# Patient Record
Sex: Female | Born: 1955 | Race: White | Hispanic: No | Marital: Married | Smoking: Never smoker
Health system: Southern US, Community
[De-identification: ages and names within clinical notes are randomized; demographics above are authoritative.]

## PROBLEM LIST (undated history)

## (undated) DIAGNOSIS — Z808 Family history of malignant neoplasm of other organs or systems: Secondary | ICD-10-CM

## (undated) DIAGNOSIS — D219 Benign neoplasm of connective and other soft tissue, unspecified: Secondary | ICD-10-CM

## (undated) DIAGNOSIS — I219 Acute myocardial infarction, unspecified: Secondary | ICD-10-CM

## (undated) DIAGNOSIS — Z800 Family history of malignant neoplasm of digestive organs: Secondary | ICD-10-CM

## (undated) DIAGNOSIS — J069 Acute upper respiratory infection, unspecified: Secondary | ICD-10-CM

## (undated) DIAGNOSIS — E785 Hyperlipidemia, unspecified: Secondary | ICD-10-CM

## (undated) DIAGNOSIS — K449 Diaphragmatic hernia without obstruction or gangrene: Secondary | ICD-10-CM

## (undated) DIAGNOSIS — R51 Headache: Secondary | ICD-10-CM

## (undated) DIAGNOSIS — F329 Major depressive disorder, single episode, unspecified: Secondary | ICD-10-CM

## (undated) DIAGNOSIS — D649 Anemia, unspecified: Secondary | ICD-10-CM

## (undated) DIAGNOSIS — Z9221 Personal history of antineoplastic chemotherapy: Secondary | ICD-10-CM

## (undated) DIAGNOSIS — R011 Cardiac murmur, unspecified: Secondary | ICD-10-CM

## (undated) DIAGNOSIS — G4733 Obstructive sleep apnea (adult) (pediatric): Secondary | ICD-10-CM

## (undated) DIAGNOSIS — A159 Respiratory tuberculosis unspecified: Secondary | ICD-10-CM

## (undated) DIAGNOSIS — E119 Type 2 diabetes mellitus without complications: Secondary | ICD-10-CM

## (undated) DIAGNOSIS — M549 Dorsalgia, unspecified: Secondary | ICD-10-CM

## (undated) DIAGNOSIS — T7840XA Allergy, unspecified, initial encounter: Secondary | ICD-10-CM

## (undated) DIAGNOSIS — J189 Pneumonia, unspecified organism: Secondary | ICD-10-CM

## (undated) DIAGNOSIS — N882 Stricture and stenosis of cervix uteri: Secondary | ICD-10-CM

## (undated) DIAGNOSIS — I209 Angina pectoris, unspecified: Secondary | ICD-10-CM

## (undated) DIAGNOSIS — S0300XA Dislocation of jaw, unspecified side, initial encounter: Secondary | ICD-10-CM

## (undated) DIAGNOSIS — F32A Depression, unspecified: Secondary | ICD-10-CM

## (undated) DIAGNOSIS — K219 Gastro-esophageal reflux disease without esophagitis: Secondary | ICD-10-CM

## (undated) DIAGNOSIS — I509 Heart failure, unspecified: Secondary | ICD-10-CM

## (undated) DIAGNOSIS — F99 Mental disorder, not otherwise specified: Secondary | ICD-10-CM

## (undated) DIAGNOSIS — I2510 Atherosclerotic heart disease of native coronary artery without angina pectoris: Secondary | ICD-10-CM

## (undated) DIAGNOSIS — Z8669 Personal history of other diseases of the nervous system and sense organs: Secondary | ICD-10-CM

## (undated) DIAGNOSIS — K760 Fatty (change of) liver, not elsewhere classified: Secondary | ICD-10-CM

## (undated) DIAGNOSIS — Z923 Personal history of irradiation: Secondary | ICD-10-CM

## (undated) DIAGNOSIS — I499 Cardiac arrhythmia, unspecified: Secondary | ICD-10-CM

## (undated) DIAGNOSIS — C801 Malignant (primary) neoplasm, unspecified: Secondary | ICD-10-CM

## (undated) DIAGNOSIS — F330 Major depressive disorder, recurrent, mild: Secondary | ICD-10-CM

## (undated) DIAGNOSIS — K222 Esophageal obstruction: Secondary | ICD-10-CM

## (undated) DIAGNOSIS — M797 Fibromyalgia: Secondary | ICD-10-CM

## (undated) DIAGNOSIS — Z9889 Other specified postprocedural states: Secondary | ICD-10-CM

## (undated) DIAGNOSIS — K259 Gastric ulcer, unspecified as acute or chronic, without hemorrhage or perforation: Secondary | ICD-10-CM

## (undated) DIAGNOSIS — C50919 Malignant neoplasm of unspecified site of unspecified female breast: Secondary | ICD-10-CM

## (undated) DIAGNOSIS — I10 Essential (primary) hypertension: Secondary | ICD-10-CM

## (undated) DIAGNOSIS — K635 Polyp of colon: Secondary | ICD-10-CM

## (undated) DIAGNOSIS — R0602 Shortness of breath: Secondary | ICD-10-CM

## (undated) DIAGNOSIS — J45909 Unspecified asthma, uncomplicated: Secondary | ICD-10-CM

## (undated) DIAGNOSIS — F419 Anxiety disorder, unspecified: Secondary | ICD-10-CM

## (undated) DIAGNOSIS — M1990 Unspecified osteoarthritis, unspecified site: Secondary | ICD-10-CM

## (undated) DIAGNOSIS — G8929 Other chronic pain: Secondary | ICD-10-CM

## (undated) DIAGNOSIS — R112 Nausea with vomiting, unspecified: Secondary | ICD-10-CM

## (undated) DIAGNOSIS — I4891 Unspecified atrial fibrillation: Secondary | ICD-10-CM

## (undated) HISTORY — DX: Family history of malignant neoplasm of digestive organs: Z80.0

## (undated) HISTORY — DX: Benign neoplasm of connective and other soft tissue, unspecified: D21.9

## (undated) HISTORY — DX: Diaphragmatic hernia without obstruction or gangrene: K44.9

## (undated) HISTORY — DX: Family history of malignant neoplasm of other organs or systems: Z80.8

## (undated) HISTORY — DX: Polyp of colon: K63.5

## (undated) HISTORY — DX: Obstructive sleep apnea (adult) (pediatric): G47.33

## (undated) HISTORY — DX: Unspecified asthma, uncomplicated: J45.909

## (undated) HISTORY — DX: Allergy, unspecified, initial encounter: T78.40XA

## (undated) HISTORY — DX: Heart failure, unspecified: I50.9

## (undated) HISTORY — PX: TUBAL LIGATION: SHX77

## (undated) HISTORY — PX: CARDIAC CATHETERIZATION: SHX172

## (undated) HISTORY — PX: OTHER SURGICAL HISTORY: SHX169

## (undated) HISTORY — PX: LOOP RECORDER IMPLANT: SHX5954

## (undated) HISTORY — DX: Stricture and stenosis of cervix uteri: N88.2

## (undated) HISTORY — DX: Type 2 diabetes mellitus without complications: E11.9

## (undated) HISTORY — DX: Hyperlipidemia, unspecified: E78.5

## (undated) HISTORY — PX: COLONOSCOPY: SHX174

## (undated) HISTORY — PX: ARTHROSCOPIC REPAIR ACL: SUR80

## (undated) HISTORY — PX: CARPAL TUNNEL RELEASE: SHX101

## (undated) HISTORY — DX: Anxiety disorder, unspecified: F41.9

## (undated) HISTORY — DX: Esophageal obstruction: K22.2

## (undated) HISTORY — DX: Major depressive disorder, single episode, unspecified: F32.9

## (undated) HISTORY — DX: Depression, unspecified: F32.A

## (undated) HISTORY — DX: Major depressive disorder, recurrent, mild: F33.0

## (undated) HISTORY — PX: ACHILLES TENDON REPAIR: SUR1153

## (undated) HISTORY — DX: Fatty (change of) liver, not elsewhere classified: K76.0

## (undated) NOTE — *Deleted (*Deleted)
Patient Care Team: Wynn Banker, MD as PCP - General (Family Medicine) Oretha Milch, MD as Consulting Physician (Pulmonary Disease) Yates Decamp, MD as Consulting Physician (Cardiology) Orma Flaming, MD as Referring Physician (Obstetrics and Gynecology) Garen Grams, MD as Referring Physician (Cardiology) Thyra Breed, MD as Consulting Physician (Anesthesiology) Emelia Loron, MD as Consulting Physician (General Surgery) Serena Croissant, MD as Consulting Physician (Hematology and Oncology) Dorothy Puffer, MD as Consulting Physician (Radiation Oncology) Glendale Chard, DO as Consulting Physician (Neurology) Donzetta Starch, MD as Consulting Physician (Dermatology)  DIAGNOSIS: No diagnosis found.  SUMMARY OF ONCOLOGIC HISTORY: Oncology History  Malignant neoplasm of upper-outer quadrant of left breast in female, estrogen receptor negative (HCC)  09/07/2019 Initial Diagnosis   Routine screening mammogram detected a 2.1cm mass in the left breast and no left axillary adenopathy. 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   09/15/2019 Genetic Testing   Genetic testing reported out on September 15, 2019 through the Multi-cancer panel found no pathogenic mutations. The Multi-Gene Panel offered by [facility] includes sequencing and/or deletion duplication testing of the following 85 genes: AIP, ALK, APC, ATM, AXIN2,BAP1,  BARD1, BLM, BMPR1A, BRCA1, BRCA2, BRIP1, CASR, CDC73, CDH1, CDK4, CDKN1B, CDKN1C, CDKN2A (p14ARF), CDKN2A (p16INK4a), CEBPA, CHEK2, CTNNA1, DICER1, DIS3L2, EGFR (c.2369C>T, p.Thr790Met variant only), EPCAM (Deletion/duplication testing only), FH, FLCN, GATA2, GPC3, GREM1 (Promoter region deletion/duplication testing only), HOXB13 (c.251G>A, p.Gly84Glu), HRAS, KIT, MAX, MEN1, MET, MITF (c.952G>A, p.Glu318Lys variant only), MLH1, MSH2, MSH3, MSH6, MUTYH, NBN, NF1, NF2, NTHL1, PALB2, PDGFRA, PHOX2B, PMS2, POLD1, POLE, POT1, PRKAR1A, PTCH1,  PTEN, RAD50, RAD51C, RAD51D, RB1, RECQL4, RET, RNF43, RUNX1, SDHAF2, SDHA (sequence changes only), SDHB, SDHC, SDHD, SMAD4, SMARCA4, SMARCB1, SMARCE1, STK11, SUFU, TERC, TERT, TMEM127, TP53, TSC1, TSC2, VHL, WRN and WT1.  The test report has been scanned into EPIC and is located under the Molecular Pathology section of the Results Review tab.  A portion of the result report is included below for reference.    09/24/2019 - 10/15/2019 Chemotherapy   palonosetron (ALOXI) injection 0.25 mg, 0.25 mg, Intravenous,  Once, 2 of 6 cycles. Administration: 0.25 mg (09/24/2019), 0.25 mg (10/15/2019)  pegfilgrastim (NEULASTA ONPRO KIT) injection 6 mg, 6 mg, Subcutaneous, Once, 2 of 6 cycles. Administration: 6 mg (09/24/2019), 6 mg (10/15/2019)  cyclophosphamide (CYTOXAN) 1,100 mg in sodium chloride 0.9 % 250 mL chemo infusion, 500 mg/m2 = 1,100 mg (83.3 % of original dose 600 mg/m2), Intravenous,  Once, 2 of 6 cycles. Dose modification: 500 mg/m2 (original dose 600 mg/m2, Cycle 1, Reason: Provider Judgment). Administration: 1,100 mg (09/24/2019), 1,100 mg (10/15/2019)  DOCEtaxel (TAXOTERE) 140 mg in sodium chloride 0.9 % 250 mL chemo infusion, 65 mg/m2 = 140 mg (86.7 % of original dose 75 mg/m2), Intravenous,  Once, 2 of 6 cycles. Dose modification: 65 mg/m2 (original dose 75 mg/m2, Cycle 1, Reason: Provider Judgment). Administration: 140 mg (09/24/2019), 140 mg (10/15/2019)  fosaprepitant (EMEND) 150 mg in sodium chloride 0.9 % 145 mL IVPB, Intravenous,  Once, 2 of 6 cycles. Administration:  (09/24/2019),  (10/15/2019)   12/09/2019 Surgery   Left lumpectomy Dwain Sarna) [phone]): microscopic focus of residual IDC, grade 3, with high grade DCIS, clear margins. No regional lymph nodes were examined.   12/10/2019 Cancer Staging   Staging form: Breast, AJCC 8th Edition - Pathologic stage from 12/10/2019: No Stage Recommended (ypT1a, pN0, cM0) - Signed by Loa Socks, NP on 12/23/2019   01/13/2020 -  02/08/2020 Radiation Therapy   The patient  initially received a dose of 42.56 Gy in 16 fractions to the breast using whole-breast tangent fields. This was delivered using a 3-D conformal technique. The pt received a boost delivering an additional 8 Gy in 4 fractions using a electron boost with electrons. The total dose was 50.56 Gy.     CHIEF COMPLIANT: Follow-up of triple negative left breast cancer  INTERVAL HISTORY: Shelby Anderson is a 49 y.o. with above-mentioned history of triple negative left breast cancer treated with neoadjuvant chemotherapy, lumpectomy, radiation, and who is currently on surveillance. She presents to the clinic today for follow-up.  ALLERGIES:  is allergic to lodine [etodolac], oxycontin [oxycodone hcl], penicillins, aspirin, darvocet [propoxyphene n-acetaminophen], nitroglycerin, tramadol, ultram [tramadol hcl], and valium.  MEDICATIONS:  Current Outpatient Medications  Medication Sig Dispense Refill  . albuterol (PROVENTIL) (2.5 MG/3ML) 0.083% nebulizer solution Take 3 mLs (2.5 mg total) by nebulization every 6 (six) hours as needed for wheezing or shortness of breath (J45.40). (Patient taking differently: Take 2.5 mg by nebulization every 6 (six) hours as needed for wheezing or shortness of breath. ) 75 mL 3  . albuterol (VENTOLIN HFA) 108 (90 Base) MCG/ACT inhaler Inhale 2 puffs into the lungs every 6 (six) hours as needed for wheezing or shortness of breath. 3 g 3  . aspirin EC 81 MG tablet Take 81 mg at bedtime by mouth.     Marland Kitchen atorvastatin (LIPITOR) 40 MG tablet TAKE 1 TABLET BY MOUTH EVERYDAY AT BEDTIME. **DUE FOR YEARLY PHYSICAL** 30 tablet 5  . Blood Glucose Monitoring Suppl (ONE TOUCH ULTRA 2) w/Device KIT USE TO CHECK BLOOD SUGAR 2 TIMES A DAY 1 kit 0  . budesonide-formoterol (SYMBICORT) 80-4.5 MCG/ACT inhaler INHALE 2 PUFFS INTO THE LUNGS EVERY MORNING AND ANOTHER 2 PUFFS 12 HOURS LATER 30.6 Inhaler 1  . Calcium Carbonate-Vitamin D 600-200 MG-UNIT  TABS Take 1 tablet by mouth daily.    Marland Kitchen CINNAMON PO Take 1,000 mg 2 (two) times daily by mouth.    . clobetasol cream (TEMOVATE) 0.05 % Apply topically 2 (two) times daily. (Patient taking differently: Apply topically daily as needed (rash). ) 30 g 0  . doxepin (SINEQUAN) 10 MG capsule Take 20 mg by mouth at bedtime.     . famotidine (PEPCID) 20 MG tablet TAKE 1 TABLET BY MOUTH 2 TIMES DAILY AS NEEDED FOR HEARTBURN OR INDIGESTION. 180 tablet 1  . fluticasone (FLONASE) 50 MCG/ACT nasal spray Place 2 sprays at bedtime as needed into both nostrils for allergies or rhinitis.     . furosemide (LASIX) 20 MG tablet TAKE 1 TO 2 TABLETS BY MOUTH EVERY DAY AS DIRECTED 60 tablet 0  . glipiZIDE (GLUCOTROL XL) 5 MG 24 hr tablet TAKE 1 TABLET BY MOUTH EVERYDAY WITH BREAKFAST. **DUE FOR YEARLY PHYSICAL** 30 tablet 0  . L-Methylfolate (DEPLIN) 7.5 MG TABS Take 7.5 mg by mouth daily with breakfast.     . Lancets (ONETOUCH DELICA PLUS LANCET30G) MISC USE TO CHECK BLOOD SUGAR 2 TIMES A DAY 100 each 3  . levothyroxine (SYNTHROID) 50 MCG tablet Take 1 tablet (50 mcg total) by mouth daily. 90 tablet 0  . LYRICA 150 MG capsule Take 150 mg by mouth 3 (three) times daily.   2  . metFORMIN (GLUCOPHAGE) 500 MG tablet TAKE 1 TABLET BY MOUTH TWICE A DAY WITH MEALS 60 tablet 0  . Misc Natural Products (GLUCOS-CHONDROIT-MSM COMPLEX PO) Take 1 tablet 3 (three) times daily by mouth.     . montelukast (SINGULAIR) 10  MG tablet TAKE 1 TABLET BY MOUTH EVERYDAY AT BEDTIME 90 tablet 1  . Multiple Vitamin (MULITIVITAMIN WITH MINERALS) TABS Take 1 tablet by mouth daily.      . mupirocin cream (BACTROBAN) 2 % Apply once daily to lesions on feet as needed. 30 g 1  . NEXIUM 40 MG capsule TAKE 1 CAPSULE BY MOUTH TWICE A DAY 180 capsule 1  . nitroGLYCERIN (NITROSTAT) 0.4 MG SL tablet Place 0.4 mg under the tongue every 5 (five) minutes as needed for chest pain.     Marland Kitchen ondansetron (ZOFRAN) 4 MG tablet Take 4 mg by mouth every 8 (eight) hours as  needed for nausea or vomiting.    Letta Pate ULTRA test strip USE TO CHECK BLOOD SUGAR 2 TIMES A DAY 100 strip 0  . Oxycodone HCl 20 MG TABS Take 20 mg every 4 (four) hours as needed by mouth (pain).     . OXYGEN Inhale 2 L into the lungs at bedtime as needed (shortness of breath).     . potassium chloride (KLOR-CON) 10 MEQ tablet TAKE 2 TABLETS BY MOUTH DAILY 180 tablet 0  . promethazine (PHENERGAN) 25 MG tablet TAKE 1 TABLET (25 MG TOTAL) BY MOUTH EVERY 6 (SIX) HOURS AS NEEDED FOR NAUSEA OR VOMITING. 90 tablet 3  . Spacer/Aero-Holding Chambers DEVI 1 Device by Does not apply route daily as needed. 1 Device 0  . sucralfate (CARAFATE) 1 g tablet DISSOLVE 1 TAB IN 1 TABLESPOON OF DISTILLED WATER FOR 15 MINS. TAKE 4XDAILY WITH MEALS & AT BEDTIME 360 tablet 3  . tiZANidine (ZANAFLEX) 4 MG tablet Take 4 mg by mouth every 8 (eight) hours as needed for muscle spasms.    . vitamin B-12 (CYANOCOBALAMIN) 500 MCG tablet Take 500 mcg by mouth daily.      No current facility-administered medications for this visit.    PHYSICAL EXAMINATION: ECOG PERFORMANCE STATUS: {CHL ONC ECOG PS:[phone]}  There were no vitals filed for this visit. There were no vitals filed for this visit.  BREAST:*** No palpable masses or nodules in either right or left breasts. No palpable axillary supraclavicular or infraclavicular adenopathy no breast tenderness or nipple discharge. (exam performed in the presence of a chaperone)  LABORATORY DATA:  I have reviewed the data as listed CMP Latest Ref Rng & Units 09/30/2020 12/08/2019 11/05/2019  Glucose 65 - 99 mg/dL 952(W) 413(K) 440(N)  BUN 7 - 25 mg/dL 12 6(L) 19  Creatinine 0.50 - 0.99 mg/dL 0.27 2.53 6.64  Sodium 135 - 146 mmol/L 142 143 145  Potassium 3.5 - 5.3 mmol/L 4.2 3.7 3.9  Chloride 98 - 110 mmol/L 101 102 106  CO2 20 - 32 mmol/L 29 27 31   Calcium 8.6 - 10.4 mg/dL 9.8 9.2 9.3  Total Protein 6.1 - 8.1 g/dL 7.5 6.6 6.7  Total Bilirubin 0.2 - 1.2 mg/dL 0.5 0.3  <4.0(H)  Alkaline Phos 38 - 126 U/L - 63 77  AST 10 - 35 U/L 26 28 29   ALT 6 - 29 U/L 25 29 36    Lab Results  Component Value Date   WBC 5.5 09/30/2020   HGB 13.7 09/30/2020   HCT 42.8 09/30/2020   MCV 84.9 09/30/2020   PLT 271 09/30/2020   NEUTROABS 3,564 09/30/2020    ASSESSMENT & PLAN:  No problem-specific Assessment & Plan notes found for this encounter.    No orders of the defined types were placed in this encounter.  The patient has a good understanding of  the overall plan. she agrees with it. she will call with any problems that may develop before the next visit here.  Total time spent: *** mins including face to face time and time spent for planning, charting and coordination of care  Serena Croissant, MD 10/17/2020  I, Kirt Boys Dorshimer, am acting as scribe for Dr. Serena Croissant.  {insert scribe attestation}

---

## 1999-03-09 ENCOUNTER — Ambulatory Visit (HOSPITAL_COMMUNITY): Admission: RE | Admit: 1999-03-09 | Discharge: 1999-03-09 | Payer: Self-pay | Admitting: *Deleted

## 1999-05-30 ENCOUNTER — Emergency Department (HOSPITAL_COMMUNITY): Admission: EM | Admit: 1999-05-30 | Discharge: 1999-05-30 | Payer: Self-pay | Admitting: Emergency Medicine

## 1999-05-30 ENCOUNTER — Encounter: Payer: Self-pay | Admitting: Emergency Medicine

## 1999-08-30 ENCOUNTER — Ambulatory Visit (HOSPITAL_COMMUNITY): Admission: RE | Admit: 1999-08-30 | Discharge: 1999-08-30 | Payer: Self-pay | Admitting: *Deleted

## 1999-09-19 ENCOUNTER — Encounter: Payer: Self-pay | Admitting: Family Medicine

## 1999-09-19 ENCOUNTER — Encounter: Admission: RE | Admit: 1999-09-19 | Discharge: 1999-09-19 | Payer: Self-pay | Admitting: Family Medicine

## 2001-03-03 ENCOUNTER — Ambulatory Visit (HOSPITAL_COMMUNITY): Admission: RE | Admit: 2001-03-03 | Discharge: 2001-03-03 | Payer: Self-pay | Admitting: *Deleted

## 2001-09-19 ENCOUNTER — Ambulatory Visit (HOSPITAL_COMMUNITY): Admission: RE | Admit: 2001-09-19 | Discharge: 2001-09-19 | Payer: Self-pay | Admitting: *Deleted

## 2001-11-20 ENCOUNTER — Emergency Department (HOSPITAL_COMMUNITY): Admission: EM | Admit: 2001-11-20 | Discharge: 2001-11-20 | Payer: Self-pay

## 2002-04-14 ENCOUNTER — Ambulatory Visit (HOSPITAL_BASED_OUTPATIENT_CLINIC_OR_DEPARTMENT_OTHER): Admission: RE | Admit: 2002-04-14 | Discharge: 2002-04-14 | Payer: Self-pay | Admitting: Orthopedic Surgery

## 2002-04-20 ENCOUNTER — Ambulatory Visit (HOSPITAL_COMMUNITY): Admission: RE | Admit: 2002-04-20 | Discharge: 2002-04-20 | Payer: Self-pay | Admitting: Orthopedic Surgery

## 2002-04-21 ENCOUNTER — Encounter: Admission: RE | Admit: 2002-04-21 | Discharge: 2002-06-09 | Payer: Self-pay | Admitting: Orthopedic Surgery

## 2002-06-03 ENCOUNTER — Ambulatory Visit (HOSPITAL_COMMUNITY): Admission: RE | Admit: 2002-06-03 | Discharge: 2002-06-03 | Payer: Self-pay | Admitting: *Deleted

## 2003-03-19 ENCOUNTER — Ambulatory Visit (HOSPITAL_COMMUNITY): Admission: RE | Admit: 2003-03-19 | Discharge: 2003-03-19 | Payer: Self-pay | Admitting: *Deleted

## 2003-03-29 ENCOUNTER — Encounter: Admission: RE | Admit: 2003-03-29 | Discharge: 2003-03-29 | Payer: Self-pay | Admitting: Family Medicine

## 2003-03-29 ENCOUNTER — Encounter: Payer: Self-pay | Admitting: Family Medicine

## 2003-09-30 ENCOUNTER — Other Ambulatory Visit: Admission: RE | Admit: 2003-09-30 | Discharge: 2003-09-30 | Payer: Self-pay | Admitting: Family Medicine

## 2004-04-13 ENCOUNTER — Encounter: Admission: RE | Admit: 2004-04-13 | Discharge: 2004-04-13 | Payer: Self-pay | Admitting: Orthopedic Surgery

## 2004-04-29 ENCOUNTER — Emergency Department (HOSPITAL_COMMUNITY): Admission: EM | Admit: 2004-04-29 | Discharge: 2004-04-30 | Payer: Self-pay | Admitting: Emergency Medicine

## 2004-05-02 ENCOUNTER — Encounter: Admission: RE | Admit: 2004-05-02 | Discharge: 2004-05-02 | Payer: Self-pay | Admitting: Orthopedic Surgery

## 2004-05-19 ENCOUNTER — Encounter: Admission: RE | Admit: 2004-05-19 | Discharge: 2004-05-19 | Payer: Self-pay | Admitting: Orthopedic Surgery

## 2004-05-31 ENCOUNTER — Ambulatory Visit (HOSPITAL_COMMUNITY): Admission: RE | Admit: 2004-05-31 | Discharge: 2004-05-31 | Payer: Self-pay | Admitting: *Deleted

## 2004-06-08 ENCOUNTER — Encounter: Admission: RE | Admit: 2004-06-08 | Discharge: 2004-07-21 | Payer: Self-pay | Admitting: Orthopedic Surgery

## 2005-01-09 ENCOUNTER — Ambulatory Visit (HOSPITAL_COMMUNITY): Admission: RE | Admit: 2005-01-09 | Discharge: 2005-01-09 | Payer: Self-pay | Admitting: Physician Assistant

## 2005-10-05 ENCOUNTER — Inpatient Hospital Stay (HOSPITAL_COMMUNITY): Admission: EM | Admit: 2005-10-05 | Discharge: 2005-10-06 | Payer: Self-pay | Admitting: *Deleted

## 2006-01-24 ENCOUNTER — Ambulatory Visit (HOSPITAL_COMMUNITY): Admission: RE | Admit: 2006-01-24 | Discharge: 2006-01-24 | Payer: Self-pay | Admitting: Physician Assistant

## 2006-03-27 ENCOUNTER — Emergency Department (HOSPITAL_COMMUNITY): Admission: EM | Admit: 2006-03-27 | Discharge: 2006-03-27 | Payer: Self-pay | Admitting: Emergency Medicine

## 2006-09-24 ENCOUNTER — Ambulatory Visit (HOSPITAL_COMMUNITY): Admission: RE | Admit: 2006-09-24 | Discharge: 2006-09-24 | Payer: Self-pay | Admitting: Orthopedic Surgery

## 2007-03-11 ENCOUNTER — Emergency Department (HOSPITAL_COMMUNITY): Admission: EM | Admit: 2007-03-11 | Discharge: 2007-03-11 | Payer: Self-pay | Admitting: Emergency Medicine

## 2007-04-21 ENCOUNTER — Ambulatory Visit (HOSPITAL_COMMUNITY): Admission: RE | Admit: 2007-04-21 | Discharge: 2007-04-21 | Payer: Self-pay | Admitting: *Deleted

## 2008-07-14 ENCOUNTER — Ambulatory Visit (HOSPITAL_COMMUNITY): Admission: RE | Admit: 2008-07-14 | Discharge: 2008-07-14 | Payer: Self-pay | Admitting: Anesthesiology

## 2008-12-02 ENCOUNTER — Emergency Department (HOSPITAL_COMMUNITY): Admission: EM | Admit: 2008-12-02 | Discharge: 2008-12-02 | Payer: Self-pay | Admitting: Emergency Medicine

## 2009-03-24 ENCOUNTER — Ambulatory Visit (HOSPITAL_COMMUNITY): Admission: RE | Admit: 2009-03-24 | Discharge: 2009-03-24 | Payer: Self-pay | Admitting: Family Medicine

## 2010-04-07 ENCOUNTER — Ambulatory Visit (HOSPITAL_COMMUNITY): Admission: RE | Admit: 2010-04-07 | Discharge: 2010-04-07 | Payer: Self-pay | Admitting: Cardiology

## 2010-06-08 ENCOUNTER — Ambulatory Visit (HOSPITAL_COMMUNITY): Admission: RE | Admit: 2010-06-08 | Discharge: 2010-06-08 | Payer: Self-pay | Admitting: Neurology

## 2010-06-27 ENCOUNTER — Emergency Department (HOSPITAL_COMMUNITY)
Admission: EM | Admit: 2010-06-27 | Discharge: 2010-06-27 | Payer: Self-pay | Source: Home / Self Care | Admitting: Emergency Medicine

## 2010-07-07 ENCOUNTER — Encounter
Admission: RE | Admit: 2010-07-07 | Discharge: 2010-10-05 | Payer: Self-pay | Source: Home / Self Care | Admitting: Neurology

## 2010-12-11 ENCOUNTER — Ambulatory Visit (HOSPITAL_COMMUNITY)
Admission: RE | Admit: 2010-12-11 | Discharge: 2010-12-11 | Payer: Self-pay | Source: Home / Self Care | Attending: Cardiology | Admitting: Cardiology

## 2010-12-13 ENCOUNTER — Other Ambulatory Visit
Admission: RE | Admit: 2010-12-13 | Discharge: 2010-12-13 | Payer: Self-pay | Source: Home / Self Care | Admitting: Family Medicine

## 2010-12-20 ENCOUNTER — Emergency Department (HOSPITAL_COMMUNITY)
Admission: EM | Admit: 2010-12-20 | Discharge: 2010-12-20 | Payer: Self-pay | Source: Home / Self Care | Admitting: Emergency Medicine

## 2010-12-20 ENCOUNTER — Ambulatory Visit (HOSPITAL_COMMUNITY)
Admission: RE | Admit: 2010-12-20 | Discharge: 2010-12-20 | Payer: Self-pay | Source: Home / Self Care | Attending: Anesthesiology | Admitting: Anesthesiology

## 2010-12-22 NOTE — Consult Note (Signed)
Shelby Anderson, Shelby Anderson          ACCOUNT NO.:  [id number]  MEDICAL RECORD NO.:  [id number]          PATIENT TYPE:  EMS  LOCATION:  [facility]                         FACILITY:  [facility]  PHYSICIAN:  Jake Bathe, MD      DATE OF BIRTH:  09/22/1956  DATE OF CONSULTATION:  12/20/2010 DATE OF DISCHARGE:  12/20/2010                                CONSULTATION   CARDIOLOGIST:  Armanda Magic, MD (former patient of Dr. Amil Amen).  PRIMARY CARE PHYSICIAN:  [name], MD  REASON FOR CONSULTATION:  Near syncope.  HISTORY OF PRESENT ILLNESS:  Shelby Anderson was here at [facility] for a myelogram when she began to feel warm/hot, sweaty, diaphoretic and possibly had an episode of syncope, but this is unclear. She has battled with these symptoms for quite some time and had an extensive workup including a recent tilt table test done by Dr. Mayford Knife. Dr. Mayford Knife recently put her on Florinef, which had improved her orthostasis; however, she was still having problems with vertigo when sitting.  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  Currently, she is with back pain, which was just chronic for her, lying on her side with no chest pain in the emergency department.  PAST MEDICAL HISTORY: 1. 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  History is not clear     according to prior records from Dr. Amil Amen.  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 2. History of reflux. 3. History of migraine headaches. 4. History of chronic low back pain, on pain management. 5. History of depression. 6. History of peptic ulcer disease with endoscopy in 2005 showing no      findings.  MEDICATIONS: 1. Zanaflex 4 mg every 8 hours. 2. Cymbalta 60 mg a day. 3. Nexium 40 mg a day. 4. Neurontin 300 mg four times a day. 5. Lunesta 3 mg one tablet as needed. 6. Phenergan 25 mg every 6 hours. 7. Oxymorphone. 8. Lidoderm patch. 9. Calcium and vitamin D. 10.Tylenol. 11.Advil. 12.Depakote ER 500 mg once at bedtime. 13.Nitroglycerin as directed. 14.Isosorbide 60 mg. 15.Aspirin 81 mg. 16.Florinef 0.1 mg tablet once a day, started by Dr. Mayford Knife. 17.Metoprolol 50 mg a day.  SURGICAL HISTORY:  Prior tubal ligation, left knee surgery, ankle, left carpal tunnel release.  SOCIAL HISTORY:  She has never been a smoker.  No recreational drug use or alcohol.  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  She is married.  There dealing with a very difficult work situation according to social history, ongoing argument whether a disability should be paid by Workers Comp versus her insurance company.  ALLERGIES: 1. VALIUM, impaired circulation. 2. ASPIRIN, GI upset. 3. PENICILLIN, anaphylaxis. 4. ULTRAM, hives. 5. LODINE, throat swelling. 6. DARVOCET, hives. 7. NITROGLYCERIN IV, BP bottoms out.  REVIEW OF SYSTEMS:  Unless specified above, all other 12 review of systems  negative.  PHYSICAL EXAMINATION:  VITAL SIGNS:  Blood pressure currently 120/78, pulse in the 70s, respirations 16, satting 100% on room air. GENERAL:  She is in bed, lying on her side in no obvious distress. EYES:  Well-perfused conjunctivae.  EOMI.  No scleral icterus. NECK:  Supple.  No lymphadenopathy, thick.  No carotid bruits appreciated. CARDIOVASCULAR:  Regular rate and rhythm.  No murmurs, rubs, or gallops. ABDOMEN:  Obese.  Positive bowel sounds.  Nontender. EXTREMITIES:  No clubbing, cyanosis, or edema. GU:  Deferred. RECTAL:  Deferred. NEUROLOGIC:  Nonfocal.  No tremors are noted.  DATA:  Point-of-care cardiac markers were normal.  CBC; white count of 9.9, hemoglobin of 15.6, and chemistry demonstrated a  creatinine of 0.8, potassium slightly low at 3.4.  ASSESSMENT AND PLAN:  A 55 year old female with longstanding history of syncope and chronic back pain. 1. Syncope - per report, possible syncope, but perhaps near syncope and     by her history, sounds quite vagal in etiology. She feels as though      she "does not remember" a few minutes of time in radiology. She has      had an extensive workup for this in the past including a recent tilt table     done by Dr. Mayford Knife.  She will be following up with Dr. Mayford Knife     actually tomorrow, she states.  Dr. Mayford Knife mentioned in her note     that she would be referring her to [facility] as well to see if     any further management ideas were available.  Dr. Mayford Knife recently     put her on Florinef, which seem to help somewhat with her     orthostasis.  This morning's episode, however, was fairly typical     of her previous type of episodes.  She currently denies any chest     pain and her EKG personally viewed shows sinus rhythm with     nonspecific ST-T wave changes, poor R-wave progression, and no     other abnormalities.  When compared to prior, there is no change.  Based upon her lab work, EKG and history, I do feel as though it is safe for her to leave the emergency department and be discharged.  No adverse arrhythmias seen on telemetry.  It is unfortunate that there was no clear explanation for her symptoms and she has seen multiple consultants for this including Neurology.  There is not appear to be any evidence of Prinzmetal angina currently. 1. Obesity - encourage weight loss. 2. Orthostatic hypotension - as above, recently started Florinef by     Dr. Mayford Knife. 3. Chronic back pain, on narcotics under pain management.  Once again,     she does have close followup with Dr. Mayford Knife tomorrow.     Jake Bathe, MD     MCS/MEDQ  D:  12/20/2010  T:  12/21/2010  Job:  [id number]  Electronically Signed by Donato Schultz MD on 12/22/2010  07:07:07 AM

## 2010-12-25 ENCOUNTER — Ambulatory Visit (HOSPITAL_COMMUNITY)
Admission: RE | Admit: 2010-12-25 | Discharge: 2010-12-25 | Payer: Self-pay | Source: Home / Self Care | Attending: Anesthesiology | Admitting: Anesthesiology

## 2010-12-25 LAB — DIFFERENTIAL
Basophils Absolute: 0 10*3/uL (ref 0.0–0.1)
Eosinophils Relative: 1 % (ref 0–5)
Lymphocytes Relative: 18 % (ref 12–46)
Monocytes Absolute: 0.5 10*3/uL (ref 0.1–1.0)
Neutro Abs: 7.6 10*3/uL (ref 1.7–7.7)

## 2010-12-25 LAB — POCT I-STAT, CHEM 8
Calcium, Ion: 1.02 mmol/L — ABNORMAL LOW (ref 1.12–1.32)
Chloride: 107 mEq/L (ref 96–112)
Creatinine, Ser: 0.8 mg/dL (ref 0.4–1.2)
Glucose, Bld: 119 mg/dL — ABNORMAL HIGH (ref 70–99)
HCT: 48 % — ABNORMAL HIGH (ref 36.0–46.0)
Potassium: 3.4 mEq/L — ABNORMAL LOW (ref 3.5–5.1)
Sodium: 143 mEq/L (ref 135–145)
TCO2: 28 mmol/L (ref 0–100)

## 2010-12-25 LAB — POCT CARDIAC MARKERS: Myoglobin, poc: 164 ng/mL (ref 12–200)

## 2010-12-25 LAB — CBC
RDW: 12.8 % (ref 11.5–15.5)
WBC: 9.9 10*3/uL (ref 4.0–10.5)

## 2010-12-29 ENCOUNTER — Other Ambulatory Visit: Payer: Self-pay | Admitting: Gastroenterology

## 2011-01-03 ENCOUNTER — Encounter: Payer: Self-pay | Admitting: Anesthesiology

## 2011-04-20 NOTE — Cardiovascular Report (Signed)
NAMEMASAYE, GATCHALIAN          ACCOUNT NO.:  [id number]   MEDICAL RECORD NO.:  [id number]          PATIENT TYPE:  INP   LOCATION:  6533                         FACILITY:  [facility]   PHYSICIAN:  Nicki Guadalajara, M.D.     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  She has a very strong family history  for cardiac disease.  Approximately 15 years ago a stress test was negative  which was done for chest pain.  Yesterday, she developed severe episode of  chest pressure with nausea and vomiting.  She also had associated  diaphoresis.  She was evaluated at [facility] and admitted by  Dr. Nehemiah Settle.  CK was 199 with an 8.1 MB fraction.  Troponin was negative.  She had borderline myoglobin.  Because of worrisome symptoms with a strong  family history of coronary disease definitive diagnostic cardiac  catheterization was recommended.   PROCEDURE:  After pre medication with Versed 2 mg intravenously, the patient  was prepped and draped in usual fashion.  Her right femoral artery was  punctured anteriorly and a 5-French sheath was inserted.  Diagnostic  catheterization was done with 5-French Judkins 4 left and right coronary  catheters.  5-French pigtail catheter was used for biplane cine left  ventriculography.  The patient tolerated the procedure well and returned to  her room in satisfactory condition.   HEMODYNAMIC DATA:  Central aortic pressure was 140/80.  Left ventricular  pressure was 140/15, post A-wave 20.   ANGIOGRAPHIC DATA:  Left main coronary artery was angiographically normal  and bifurcated into an LAD and left circumflex system.   The LAD was angiographically normal, essentially was a twin LAD system with  a diagonal LAD extending to the apex.  The LAD and its  branches were  angiographically normal.   The circumflex vessel was angiographically normal and gave rise to one  marginal vessel.   The right coronary was a large dominant vessel that gave rise to a large  PDA, inferior LV, and posterolateral coronary artery which supplied the  entire posterior wall and extended to the apex.  The RCA and its branches  were normal.   Biplane cine left ventriculography revealed normal LV contractility.  There  were no focal segmental wall motion abnormalities.  There was no mitral  regurgitation.   IMPRESSION:  1.  Normal left ventricular function.  2.  Normal coronary arteries.           ______________________________  Nicki Guadalajara, M.D.     TK/MEDQ  D:  10/05/2005  T:  10/05/2005  Job:  [id number]   cc:   Cath Lab   Clarene Duke, M.D.   Donia Guiles, M.D.  Fax: [phone]

## 2011-04-20 NOTE — H&P (Signed)
Shelby Anderson, Shelby Anderson          ACCOUNT NO.:  [id number]   MEDICAL RECORD NO.:  [id number]          PATIENT TYPE:  INP   LOCATION:  1825                         FACILITY:  [facility]   PHYSICIAN:  Theone Stanley, MD   DATE OF BIRTH:  16-Aug-1956   DATE OF ADMISSION:  10/04/2005  DATE OF DISCHARGE:                                HISTORY & PHYSICAL   CHIEF COMPLAINT:  Chest pain.   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 She was at work today  after getting signed out, she suddenly felt short of breath. Subsequently,  she had some chest pressure, became nauseated, actually vomited, and was  diaphoretic. Per husband, apparently, the nurse on the floor also noted that  she was very flushed and had a blush-type color. The substernal pressure  lasted approximately 10 minutes. There was some radiation up to the left  neck. None down the arm or to the back. Overall, the whole episode with the  shortness of breath and chest pressure lasted about 20 minutes. The patient  had one bout of emesis which was brown color. She has had no cough or  wheezing lately. She states she did have some elevated cholesterol which  apparently they had been trying to control with diet; however, they have not  rechecked it recently. She has intermittent chest pain which is very much  related to stress. At one point and time, she was on sublingual  nitroglycerin for this; however, because of severe hypotension with the  nitroglycerin this was discontinued.   PAST MEDICAL HISTORY:  1.  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 This was confirmed by EKG. She had a stress test at      that time by Dr. Gaspar Garbe B. Little which was negative.  2.  She has a history of peptic ulcer disease.  3.  Severe GERD.  4.  Migraines.  5.  Asthmatic bronchitis.   PAST SURGICAL HISTORY:  1.  Postganglionectomy.  2.  Carpal tunnel on the left.  3.  Left knee  arthroscopy.  4.  Left Achilles tendon repair.  5.  Tubal ligation.   MEDICATIONS:  1.  Robaxin 750 mg 1 p.o. q.h.s. p.r.n.  2.  OxyContin 10 mg 2 tablets q.12h. According to the patient, she can only      take the 10 mg. If she takes the 20 mg, this agitates her stomach.  3.  AcipHex 1 p.o. b.i.d.  4.  Multivitamins.   ALLERGIES:  NITROGLYCERIN, it is more intolerance which causes hypotension.  ASPIRIN secondary to her GERD. PENICILLIN, she has anaphylactic reaction.  ULTRAM, she gets hives. VALIUM, she states she has an issue with her  circulation and actually was the color black below her waist when this  happened. LODINE causes foot swelling.   SOCIAL HISTORY:  The patient lives in [place]. Works in the [employer] here at  [employer]. Married. Has three children. No history of tobacco,  alcohol, or illicit drug use.   FAMILY HISTORY:  Significant for MI, stroke.  REVIEW OF SYSTEMS:  The patient states that she has been somewhat achy since  getting her flu shot. Otherwise, all systems were negative.   PHYSICAL EXAMINATION:  VITAL SIGNS:  Temperature 97.7, blood pressure  149/70, pulse of 104, respiratory rate 26. Saturating 100% on two liters.  GENERAL:  A very pleasant 55 year old female who is in no distress.  HEENT:  Head is normocephalic and atraumatic. Eyes are 3 mm, pupils are  equal and reactive to light. Extraocular movements intact. Ears are normal  with no discharge. Throat is clear. No erythema and no exudate. Moist mucus  membranes.  NECK:  Supple. No lymphadenopathy.  HEART:  Regular rate and rhythm. No murmur, rub, or gallop appreciated.  LUNGS:  Clear to auscultation bilaterally.  ABDOMEN:  Soft, nontender, and nondistended.  EXTREMITIES:  Without clubbing, cyanosis, or edema.  NEUROLOGICAL:  The patient is alert and oriented x3. Nonfocal.  GU:  Deferred.   LABORATORY DATA:  Sodium 141, potassium 3.3, chloride 110, BUN 89, glucose  87, creatinine  0.8. Myoglobin was 234. CK-MB is 8.1, troponin less than  0.05. Repeat showed a myoglobin of 183, CK-MB at 12.2, troponin less than  0.05. Chest x-ray is pending. EKG showed normal sinus rhythm, no evidence of  acute event.   ASSESSMENT:  Shelby Anderson is a real pleasant 55 year old presenting with  chest pain/pressure.   PLAN:  1.  Chest pain:  The patient's presentation is definitely concerning for      cardiac etiology for her pain, especially with a history of a MI in      1990s. Although her cardiac markers are negative and her EKG is normal      sinus rhythm, we will admit her for rule out MI. Continue to check      cardiac enzymes. We will try to contact Dr. Clarene Duke tomorrow for possible      stress test. Continue with aspirin. Check fasting lipids. Repeat an EKG.  2.  GERD:  We will continue with Protonix b.i.d. GI cocktail p.r.n.  3.  Chronic low back pain:  Continue on OxyContin.  4.  Prophylaxis:  She will be on the Protonix. Put her on Lovenox      subcutaneously.      Theone Stanley, MD  Electronically Signed     AEJ/MEDQ  D:  10/05/2005  T:  10/05/2005  Job:  [id number]   cc:   Thereasa Solo. Little, M.D.  Fax: [phone]   Donia Guiles, M.D.  Fax: [phone]   Bernette Redbird, M.D.  Fax: [phone]

## 2011-04-20 NOTE — Op Note (Signed)
[facility]  Patient:    Shelby Anderson, Shelby Anderson Visit Number: [id number] MRN: [id number]          Service Type: DSU Location: [facility] Attending Physician:  Cornell Barman Dictated by:   Lenard Galloway Chaney Malling, M.D. Proc. Date: 04/14/02 Admit Date:  04/14/2002 Discharge Date: 04/14/2002                             Operative Report  PREOPERATIVE DIAGNOSIS:  Internal derangement left knee secondary to trauma.  POSTOPERATIVE DIAGNOSIS:  Grade 2 and grade 3 cartilage damage posterior aspect of the left patella and medial femoral condyle; tear of posterior horn medial meniscus left knee.  OPERATION:  Arthroscopy with debridement posterior horn medial meniscus, left knee; chondroplasty medial femoral condyle in posterior aspect left patella.  SURGEON:  Lenard Galloway. Chaney Malling, M.D.  ANESTHESIA:  MAC converted to general.  PATHOLOGY:  With the arthroscope, very careful examination of the knee was undertaken.  The knee was full of blood.  The arthroscope was placed in the superior pouch, and there was severe fraying and tearing and trauma to the articular cartilage in the posterior aspect of the patella primarily over the medial patella facet.  This was at least grade 3 and grade 3 cartilage damage.  No loose bodies were seen in the superior pouch but the notch area was visualized and this appeared fairly normal.  The arthroscope was then passed down to the ACL, and the ACL appeared to be normal.  The arthroscope was passed in the medial compartment, and there was damage to the weightbearing area of the medial femoral condyle and grade 2 in some areas with grade 3 cartilage damage.  No raw bone was seen.  The medial tibial plateau showed some grade 1 and grade 2 changes.  There was some horizontal tears in the posterior horn of the medial meniscus.  The arthroscope was then passed into the lateral compartment.  The cartilage over the lateral femoral  condyle lateral to the plateau and entire circumference of the lateral meniscus was normal.  DESCRIPTION OF PROCEDURE:  The patient was on the operating table in the supine position.  The pneumatic tourniquet was applied to the left upper thigh.  The left leg was placed in the leg holder.  The entire lower extremity was prepped with DuraPrep and draped out in the usual manner.  Marcaine was placed in the knee and Xylocaine and epinephrine used to infiltrate the puncture wounds.  An infusing cannula was placed in the superior medial pouch and the knee distended with saline.  The findings are as described above. Attention was first turned to the patella.  The posterior aspect of the patella where there was marked fraying of articular cartilage was debrided with the articular shaver through both the medial and lateral through both the medial and lateral portals.  Excellent decompression of the torn portions of articular cartilage was completed.  The arthroscope was then passed into the medial compartment.  A series of baskets were passed through both portals and the portals of the medial meniscus was debrided with these baskets.  The articular shaver was then introduced, and the _____ was smoothly balanced.  A nice transection did occur to the medial meniscus.  Excellent decompression of the torn portion was achieved.  There was significant damage to the weightbearing area of the medial femoral condyle, and this was also debrided with a chondroplasty shaver.  The knee was then infiltrated with Marcaine.  A large compressive dressing was applied.  The patient returned to the recovery room in excellent condition.  Technically, this procedure went extremely well.  FOLLOWUP CARE: 1. Return to my office on Monday. 2. Weightbearing as tolerated. Dictated by:   Lenard Galloway Chaney Malling, M.D. Attending Physician:  Cornell Barman DD:  04/14/02 TD:  04/16/02 Job: [id number]

## 2011-04-20 NOTE — Discharge Summary (Signed)
NAMESTEVE, Shelby Anderson          ACCOUNT NO.:  [id number]   MEDICAL RECORD NO.:  [id number]          PATIENT TYPE:  INP   LOCATION:  6533                         FACILITY:  [facility]   PHYSICIAN:  Deirdre Peer. Polite, M.D. DATE OF BIRTH:  10-17-1956   DATE OF ADMISSION:  10/04/2005  DATE OF DISCHARGE:  10/06/2005                                 DISCHARGE SUMMARY   DISCHARGE DIAGNOSES:  1.  Chest pain, noncardiac in origin.  Please note on cardiac      catheterization, normal coronaries with normal ejection fraction.  2.  Gastroesophageal reflux disease, severe.  3.  Hypercholesterolemia, total cholesterol of 196, LDL of 142, HDL of 34,      patient to discuss with her primary medical doctor how to proceed.  4.  Obesity.  5.  Asthma.  6.  Chronic pain.   DISCHARGE MEDICATIONS:  Patient to resume home medications.  1.  Protonix 40 mg b.i.d.  2.  Robaxin 750 mg p.r.n. q.h.s.  3.  OxyContin 10 mg b.i.d.  4.  Multivitamin daily.  5.  Carafate q.h.s., take 2 q.i.d. p.r.n.   DISPOSITION:  The patient is being discharged to home in stable condition,  asked to follow with her primary MD in approximately one to two weeks for  further evaluation to discuss her dyslipidemia.   STUDIES:  The patient had a total cholesterol of 196, an LDL of 142, an HDL  of 34.  Chest x-ray within normal limits.  D-dimer within normal limits.  Thyroidal studies:  TSH 2.4.  CBC within normal limits.  A BMET within  normal limits.  Cardiac enzymes:  CK 199, followup 174, followup 155, ND of  8.1 with a followup of 7.4 and followup of 7.5, index of 4.1 with a followup  of 4.3 and a followup of 4.8.  Troponin-I 0.05.   HISTORY OF PRESENT ILLNESS:  A 55 year old female with a history of asthma,  severe GERD, and migraines, presented to the ED for evaluation of chest  pain.  In the ED, the patient was evaluated, EKG within normal limits with  mildly elevated point-of-care enzymes.  Because of her history of chest  pain  in the past and characteristic symptoms as well as high cholesterol,  admission was deemed necessary for further evaluation and treatment.  Please  see the dictated H&P for further details.   PAST MEDICAL HISTORY:  Per admission H&P and is significant for:  1.  Chest pain evaluation in the 1990s.  2.  History of peptic ulcer disease.  3.  History of GERD.  4.  Migraines.  5.  Asthmatic bronchitis.   PAST SURGICAL HISTORY:  Per admission H&P:  1.  Post gangliectomy.  2.  Carpal tunnel on her left.  3.  Left knee arthroscopy.  4.  Left Achilles' tendon repair.  5.  Tubal ligation.   ADMISSION MEDICATIONS:  Per admission H&P.   HOSPITAL COURSE:  The patient was admitted to a telemetry floor bed for  evaluation and treatment of chest pain.  The patient had serial cardiac  enzymes, which were mildly abnormal.  Cardiac consultation was  obtained, and  the patient was seen by Dr. Clarene Duke, who took the patient in  for a cardiac  catheterization.  The patient actually had the procedure performed by Nicki Guadalajara, MD.  Normal coronaries with normal LV function.  The patient did not  have any other complaints of chest pain during this hospitalization.  Because of her symptoms, the patient did undergo abdominal ultrasound to  rule out gallbladder disease. The gallbladder was within normal limits.  As  stated, the patient's coronaries were clean, a normal EF, and normal  gallbladder.  Patient's symptoms more than likely were secondary to her  severe reflux.  At this time, patient is believed stable for discharge to  home.      Deirdre Peer. Polite, M.D.  Electronically Signed     RDP/MEDQ  D:  10/06/2005  T:  10/07/2005  Job:  [id number]   cc:   Donia Guiles, M.D.  Fax: [phone]

## 2011-08-13 ENCOUNTER — Inpatient Hospital Stay (HOSPITAL_COMMUNITY)
Admission: EM | Admit: 2011-08-13 | Discharge: 2011-08-17 | DRG: 202 | Disposition: A | Discharge status: Home / Self Care | Payer: Self-pay | Attending: Internal Medicine | Admitting: Internal Medicine

## 2011-08-13 ENCOUNTER — Emergency Department (HOSPITAL_COMMUNITY): Payer: Self-pay

## 2011-08-13 DIAGNOSIS — Z886 Allergy status to analgesic agent status: Secondary | ICD-10-CM

## 2011-08-13 DIAGNOSIS — J45901 Unspecified asthma with (acute) exacerbation: Principal | ICD-10-CM | POA: Diagnosis present

## 2011-08-13 DIAGNOSIS — Z880 Allergy status to penicillin: Secondary | ICD-10-CM

## 2011-08-13 DIAGNOSIS — K219 Gastro-esophageal reflux disease without esophagitis: Secondary | ICD-10-CM | POA: Diagnosis present

## 2011-08-13 DIAGNOSIS — J189 Pneumonia, unspecified organism: Secondary | ICD-10-CM | POA: Diagnosis present

## 2011-08-13 DIAGNOSIS — F329 Major depressive disorder, single episode, unspecified: Secondary | ICD-10-CM | POA: Diagnosis present

## 2011-08-13 DIAGNOSIS — E119 Type 2 diabetes mellitus without complications: Secondary | ICD-10-CM | POA: Diagnosis present

## 2011-08-13 DIAGNOSIS — F3289 Other specified depressive episodes: Secondary | ICD-10-CM | POA: Diagnosis present

## 2011-08-13 DIAGNOSIS — Z7982 Long term (current) use of aspirin: Secondary | ICD-10-CM

## 2011-08-13 DIAGNOSIS — G609 Hereditary and idiopathic neuropathy, unspecified: Secondary | ICD-10-CM | POA: Diagnosis present

## 2011-08-13 DIAGNOSIS — G894 Chronic pain syndrome: Secondary | ICD-10-CM | POA: Diagnosis present

## 2011-08-13 DIAGNOSIS — I10 Essential (primary) hypertension: Secondary | ICD-10-CM | POA: Diagnosis present

## 2011-08-13 DIAGNOSIS — Z79899 Other long term (current) drug therapy: Secondary | ICD-10-CM

## 2011-08-13 DIAGNOSIS — E876 Hypokalemia: Secondary | ICD-10-CM | POA: Diagnosis present

## 2011-08-13 DIAGNOSIS — T380X5A Adverse effect of glucocorticoids and synthetic analogues, initial encounter: Secondary | ICD-10-CM | POA: Diagnosis present

## 2011-08-13 DIAGNOSIS — Z23 Encounter for immunization: Secondary | ICD-10-CM

## 2011-08-14 LAB — BASIC METABOLIC PANEL
CO2: 27 mEq/L (ref 19–32)
Chloride: 104 mEq/L (ref 96–112)
Creatinine, Ser: 0.69 mg/dL (ref 0.50–1.10)
GFR calc Af Amer: >60 mL/min (ref >60)
GFR calc non Af Amer: >60 mL/min (ref >60)
Potassium: 3.2 mEq/L — ABNORMAL LOW (ref 3.5–5.1)

## 2011-08-14 LAB — TSH: TSH: 1.989 u[IU]/mL (ref 0.350–4.500)

## 2011-08-14 LAB — DIFFERENTIAL
Basophils Absolute: 0 10*3/uL (ref 0.0–0.1)
Basophils Relative: 0 % (ref 0–1)
Lymphocytes Relative: 14 % (ref 12–46)
Neutro Abs: 7.6 10*3/uL (ref 1.7–7.7)

## 2011-08-14 LAB — CBC
HCT: 43.4 % (ref 36.0–46.0)
MCHC: 32.7 g/dL (ref 30.0–36.0)
MCV: 86.5 fL (ref 78.0–100.0)
Platelets: 302 10*3/uL (ref 150–400)
RBC: 5.02 MIL/uL (ref 3.87–5.11)
RDW: 13.6 % (ref 11.5–15.5)
WBC: 9 10*3/uL (ref 4.0–10.5)

## 2011-08-14 LAB — POCT I-STAT 3, ART BLOOD GAS (G3+)
Acid-Base Excess: 2 mmol/L (ref 0.0–2.0)
pCO2 arterial: 44.9 mmHg (ref 35.0–45.0)
pH, Arterial: 7.393 (ref 7.350–7.400)

## 2011-08-14 LAB — CK TOTAL AND CKMB (NOT AT ARMC)
CK, MB: 7.5 ng/mL (ref 0.3–4.0)
Total CK: 163 U/L (ref 7–177)

## 2011-08-14 LAB — CARDIAC PANEL(CRET KIN+CKTOT+MB+TROPI)
CK, MB: 9.7 ng/mL (ref 0.3–4.0)
CK, MB: 11.1 ng/mL (ref 0.3–4.0)
Relative Index: 4.6 — ABNORMAL HIGH (ref 0.0–2.5)
Relative Index: 5.1 — ABNORMAL HIGH (ref 0.0–2.5)
Total CK: 210 U/L — ABNORMAL HIGH (ref 7–177)
Total CK: 219 U/L — ABNORMAL HIGH (ref 7–177)
Troponin I: <0.3 ng/mL (ref <0.30)
Troponin I: <0.3 ng/mL (ref <0.30)

## 2011-08-14 LAB — PHOSPHORUS: Phosphorus: 3.4 mg/dL (ref 2.3–4.6)

## 2011-08-15 LAB — BASIC METABOLIC PANEL
Calcium: 9.2 mg/dL (ref 8.4–10.5)
GFR calc Af Amer: >60 mL/min (ref >60)
GFR calc non Af Amer: >60 mL/min (ref >60)
Glucose, Bld: 202 mg/dL — ABNORMAL HIGH (ref 70–99)
Potassium: 4.2 mEq/L (ref 3.5–5.1)
Sodium: 141 mEq/L (ref 135–145)

## 2011-08-15 LAB — CBC
MCH: 28.9 pg (ref 26.0–34.0)
MCHC: 32.5 g/dL (ref 30.0–36.0)
RDW: 14.4 % (ref 11.5–15.5)

## 2011-08-15 LAB — GLUCOSE, CAPILLARY

## 2011-08-16 LAB — HEMOGLOBIN A1C
Hgb A1c MFr Bld: 6.8 % — ABNORMAL HIGH (ref <5.7)
Mean Plasma Glucose: 148 mg/dL — ABNORMAL HIGH (ref <117)

## 2011-08-16 LAB — GLUCOSE, CAPILLARY
Glucose-Capillary: 261 mg/dL — ABNORMAL HIGH (ref 70–99)
Glucose-Capillary: 226 mg/dL — ABNORMAL HIGH (ref 70–99)

## 2011-08-16 LAB — CBC
HCT: 36.8 % (ref 36.0–46.0)
Hemoglobin: 11.8 g/dL — ABNORMAL LOW (ref 12.0–15.0)
MCH: 28.4 pg (ref 26.0–34.0)
MCHC: 32.1 g/dL (ref 30.0–36.0)
RBC: 4.15 MIL/uL (ref 3.87–5.11)

## 2011-08-17 LAB — GLUCOSE, CAPILLARY: Glucose-Capillary: 222 mg/dL — ABNORMAL HIGH (ref 70–99)

## 2011-08-17 LAB — CBC
HCT: 36.4 % (ref 36.0–46.0)
Hemoglobin: 11.7 g/dL — ABNORMAL LOW (ref 12.0–15.0)
MCH: 28.7 pg (ref 26.0–34.0)
MCHC: 32.1 g/dL (ref 30.0–36.0)
MCV: 89.2 fL (ref 78.0–100.0)
RBC: 4.08 MIL/uL (ref 3.87–5.11)

## 2011-08-17 LAB — BASIC METABOLIC PANEL
BUN: 18 mg/dL (ref 6–23)
CO2: 35 mEq/L — ABNORMAL HIGH (ref 19–32)
Calcium: 9 mg/dL (ref 8.4–10.5)
Creatinine, Ser: 0.7 mg/dL (ref 0.50–1.10)
GFR calc non Af Amer: >60 mL/min (ref >60)
Glucose, Bld: 121 mg/dL — ABNORMAL HIGH (ref 70–99)

## 2011-08-19 NOTE — Discharge Summary (Signed)
Shelby Anderson, Shelby Anderson          ACCOUNT NO.:  [id number]  MEDICAL RECORD NO.:  [id number]  LOCATION:  4714                         FACILITY:  [facility]  PHYSICIAN:  Conley Canal, MD      DATE OF BIRTH:  1956/04/11  DATE OF ADMISSION:  08/13/2011 DATE OF DISCHARGE:  08/17/2011                        DISCHARGE SUMMARY - REFERRING   PRIMARY CARE PHYSICIAN:  Dr. [name].  CARDIOLOGIST:  Dr. Donato Schultz.  DISCHARGE DIAGNOSES: 1. Acute exacerbation of bronchial asthma with possibility of early     community-acquired pneumonia. 2. Borderline diabetes mellitus.  Possibly steroid-induced hemoglobin     A1c 6.8. 3. Morbid obesity, BMI greater than 35. 4. Malignant hypertension. 5  Peripheral neuropathy. 1. Depression. 2. Gastroesophageal reflux disease. 3. Chronic pain syndrome.  DISCHARGE MEDICATIONS: 1. Albuterol 2 puffs every 4 hours as needed. 2. Mucinex 1200 mg twice daily for 3 more days. 3. Moxifloxacin 400 mg daily for 4 more days. 4. Quick prednisone taper. 5. Advair 250/50 one puff twice daily. 6. Senokot 1 tablet twice daily as needed. 7. Tylenol #3 one tablet 3 times daily for 4 days.  The patient to     hold oxycodone while taking Tylenol #3. 8. Alclometasone cream 1 application topically twice daily. 9. Aspirin 81 mg daily. 10.Calcium with vitamin D OTC 3 times daily. 11.Depakote 500 mg q.h.s. 12.Deplin 7.5 mg daily. 13.Fish oil OTC 1 capsule twice daily. 14.Fludrocortisone 0.1 mg twice daily. 15.Imdur 60 mg daily. 16.Lidoderm patch transdermally q.h.s. 17.Lunesta 3 mg q.h.s. p.r.n. 18.Toprol XL 50 mg daily. 19.Multivitamins 1 tablet daily. 20.Neurontin 300 mg 4 times daily. 21.Nexium 40 mg twice daily. 22.Nitroglycerin 0.4 mg every 5 minutes p.r.n. sublingually. 23.Oxycodone 10 mg 5 times daily.  The patient not to take oxycodone     CR for the next 4 days as she will be on Tylenol #3. 24.Phenergan 25 mg every 6 hours as needed.  PROCEDURES  PERFORMED:  Chest x-ray on September 11 showed cardiomegaly without focal consolidation.  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  This was not improving with albuterol inhalations alone.  At the time of admission, chest x-ray showed cardiomegaly with no focal consolidation.  However, her white count was 12,000 on September 12 and her symptoms were not improving and she was hence started on Avelox in addition to other intervention started at admission including bronchodilators, systemic steroids.  She has made gradual improvement although her cough has been on and off.  Today, white count is 9.8.  She is afebrile.  Of note is that the patient was found to have borderline diabetes mellitus with a hemoglobin A1c of 6.8 in this admission.  This could be steroid-induced.  She will need lifestyle modification including carb-consistent diet and she we will check her blood sugars at home and follow up with Dr. [name] to see if need for oral medications, ideally metformin given that she is overweight.  In this admission, the patient was noted to be on a beta-blocker which has been prescribed by Cardiology; however, if she continued  to have more bronchial asthma exacerbation, it might be worth discontinuing a beta- blocker and trying a calcium-channel blocker.  She also seems to have polypharmacy but I would defer further adjustments of her medications to Dr. [name].  LABORATORY DATA:  Today, her labs include electrolytes showing a potassium of 3.1 which was replaced prior to discharge, BUN 18, creatinine 0.7, pCO2 35, Pro-BNP 121.  She is discharged in stable condition to follow with Dr. [name] in the next 1-2 weeks.  The time spent for this discharge preparation is less than 30  minutes.     Conley Canal, MD     SR/MEDQ  D:  08/17/2011  T:  08/17/2011  Job:  [id number]  cc:   Jake Bathe, MD [name], M.D.  Electronically Signed by Conley Canal  on 08/19/2011 03:56:42 PM

## 2011-09-06 LAB — CBC
Hemoglobin: 12.3 g/dL (ref 12.0–15.0)
RBC: 4.21 MIL/uL (ref 3.87–5.11)
RDW: 13.5 % (ref 11.5–15.5)
WBC: 7.3 10*3/uL (ref 4.0–10.5)

## 2011-09-06 LAB — DIFFERENTIAL
Basophils Relative: 0 % (ref 0–1)
Eosinophils Absolute: 0.1 10*3/uL (ref 0.0–0.7)
Monocytes Absolute: 0.4 10*3/uL (ref 0.1–1.0)
Monocytes Relative: 6 % (ref 3–12)
Neutrophils Relative %: 68 % (ref 43–77)

## 2011-09-06 LAB — COMPREHENSIVE METABOLIC PANEL
ALT: 23 U/L (ref 0–35)
Alkaline Phosphatase: 73 U/L (ref 39–117)
Chloride: 107 mEq/L (ref 96–112)
Glucose, Bld: 100 mg/dL — ABNORMAL HIGH (ref 70–99)
Potassium: 3.3 mEq/L — ABNORMAL LOW (ref 3.5–5.1)
Sodium: 142 mEq/L (ref 135–145)
Total Bilirubin: 0.7 mg/dL (ref 0.3–1.2)
Total Protein: 6.7 g/dL (ref 6.0–8.3)

## 2011-09-06 LAB — POCT CARDIAC MARKERS: Myoglobin, poc: 205 ng/mL (ref 12–200)

## 2011-09-15 NOTE — H&P (Signed)
Shelby Anderson, GUILFORD          ACCOUNT NO.:  [id number]  MEDICAL RECORD NO.:  [id number]  LOCATION:  [facility]                         FACILITY:  [facility]  PHYSICIAN:  Lonia Blood, M.D.      DATE OF BIRTH:  1956-10-30  DATE OF ADMISSION:  08/13/2011 DATE OF DISCHARGE:                             HISTORY & PHYSICAL   PRIMARY CARE PHYSICIAN:  Shelby Raider, MD  PRESENTING COMPLAINT:  Shortness of breath.  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  She has had some cough on and off for about 1-2 weeks now.  She has been trying to wait it out and using her inhaler at home.  This has gotten worse to the point where she was not able to sleep last night.  She has a history of seasonal bronchitis and was told she could have some asthma before.  She is not a smoker.  She denied any fever or chills.  She denied any nausea, vomiting, or diarrhea.  No chest pain.  She has had some recent cold symptom with postnasal drip.  She denied any hemoptysis.  No diarrhea.  PAST MEDICAL HISTORY:  Significant for: 1. Hypertension. 2. Neuropathy. 3. Depression. 4. History of asthma. 5 . GERD. 1. Morbid obesity.  ALLERGIES: 1. ASPIRIN. 2. DARVOCET-N 100. 3. PENICILLIN. 4. VALIUM. 5. IODINE.  MEDICATIONS:  Neurontin, Depakote, lidocaine, Nexium, Deplin, and Percocet.  SOCIAL HISTORY:  She is married, lives in [place].  She just retired.  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  No tobacco use.  No alcohol or IV drug use.  FAMILY HISTORY:  Mainly hypertension.  REVIEW OF SYSTEMS:  All systems reviewed are negative except per HPI.  PHYSICAL EXAMINATION:  VITAL SIGNS:  Temperature is 99.3.  Initial blood pressure 177/92, went down to 92/69.  Pulse 89, respiratory rate of 20, and sats 94% on room air. GENERAL:  She is awake, alert,  oriented, in no obvious distress.  She is obese. HEENT:  PERRL.  EOMI.  No significant pallor or jaundice.  No rhinorrhea. NECK:  Supple.  No visible JVD.  No lymphadenopathy. RESPIRATORY:  She has good air entry bilaterally.  No wheezes, no rales, no crackles. CARDIOVASCULAR:  She has S1 and S2.  No audible murmur. RESPIRATORY:  She has decreased air entry at the bases with marked expiratory wheezing and using extra muscle of respiration. ABDOMEN:  Obese, soft, and nontender with positive bowel sounds. EXTREMITIES:  No edema, cyanosis, or clubbing. SKIN:  No significant pallor.  No rashes, no ulcers. MUSCULOSKELETAL:  No joint swelling or tenderness.  LABORATORY DATA:  White count is 9.0, hemoglobin 14.2 with platelet count of 302 with some mild left shift.  Sodium 144, potassium 3.2, chloride 104, CO2 of 27, glucose 189, BUN 12, and creatinine 0.69 with a calcium 9.6.  Chest x-ray showed cardiomegaly without any focal consolidation.  ASSESSMENT:  This is a 55 year old female presenting with what appears to be acute asthma exacerbation.  She has no prior history of COPD or tobacco use.  This could also  be some form of acute bronchitis on top of her asthma.  PLAN: 1. Acute asthma exacerbation.  We will admit the patient, put on some     tele momentarily.  We will continue nebulizers and IV steroids.  I     will hold on antibiotics since she is not febrile and did not have     any COPD history.  Depending on how the patient responds to these,     we will titrate her to oral steroids and nebulizers probably at     home. 2. Hypertension.  I will resume her home medication as much as     possible, but her blood pressure is not declining. 3. GERD.  Continue her PPI. 4. Depression.  Continue with her home medications. 5. Neuropathy.  She is currently on Neurontin and Depakote.  She     denied any seizure disorder specifically, but we will resume all     her medications. 6.  Hypokalemia.  Replete her potassium. 7. Morbid obesity.  If possible, get nutrition counseling. 8. Chronic pain.  Continue with her on and off oral pain medicine as     per home dose.     Lonia Blood, M.D.     Verlin Grills  D:  08/14/2011  T:  08/14/2011  Job:  [id number]  Electronically Signed by Lonia Blood M.D. on 09/15/2011 03:08:20 PM

## 2011-11-20 ENCOUNTER — Inpatient Hospital Stay (HOSPITAL_COMMUNITY): Payer: Self-pay

## 2011-11-20 ENCOUNTER — Encounter (HOSPITAL_COMMUNITY): Payer: Self-pay | Admitting: *Deleted

## 2011-11-20 ENCOUNTER — Inpatient Hospital Stay (HOSPITAL_COMMUNITY)
Admission: AD | Admit: 2011-11-20 | Discharge: 2011-11-22 | DRG: 203 | Disposition: A | Discharge status: Home / Self Care | Payer: Self-pay | Source: Ambulatory Visit | Attending: Internal Medicine | Admitting: Internal Medicine

## 2011-11-20 DIAGNOSIS — K219 Gastro-esophageal reflux disease without esophagitis: Secondary | ICD-10-CM | POA: Diagnosis present

## 2011-11-20 DIAGNOSIS — M129 Arthropathy, unspecified: Secondary | ICD-10-CM | POA: Diagnosis present

## 2011-11-20 DIAGNOSIS — J45901 Unspecified asthma with (acute) exacerbation: Principal | ICD-10-CM | POA: Diagnosis present

## 2011-11-20 DIAGNOSIS — R0602 Shortness of breath: Secondary | ICD-10-CM

## 2011-11-20 DIAGNOSIS — Z9119 Patient's noncompliance with other medical treatment and regimen: Secondary | ICD-10-CM

## 2011-11-20 DIAGNOSIS — F411 Generalized anxiety disorder: Secondary | ICD-10-CM | POA: Diagnosis present

## 2011-11-20 DIAGNOSIS — I10 Essential (primary) hypertension: Secondary | ICD-10-CM | POA: Diagnosis present

## 2011-11-20 DIAGNOSIS — Z91199 Patient's noncompliance with other medical treatment and regimen due to unspecified reason: Secondary | ICD-10-CM

## 2011-11-20 DIAGNOSIS — Z8711 Personal history of peptic ulcer disease: Secondary | ICD-10-CM

## 2011-11-20 DIAGNOSIS — I252 Old myocardial infarction: Secondary | ICD-10-CM

## 2011-11-20 DIAGNOSIS — I4891 Unspecified atrial fibrillation: Secondary | ICD-10-CM | POA: Diagnosis present

## 2011-11-20 DIAGNOSIS — F3289 Other specified depressive episodes: Secondary | ICD-10-CM | POA: Diagnosis present

## 2011-11-20 DIAGNOSIS — I2510 Atherosclerotic heart disease of native coronary artery without angina pectoris: Secondary | ICD-10-CM | POA: Diagnosis present

## 2011-11-20 DIAGNOSIS — E876 Hypokalemia: Secondary | ICD-10-CM | POA: Diagnosis present

## 2011-11-20 DIAGNOSIS — G8929 Other chronic pain: Secondary | ICD-10-CM | POA: Diagnosis present

## 2011-11-20 DIAGNOSIS — M549 Dorsalgia, unspecified: Secondary | ICD-10-CM | POA: Diagnosis present

## 2011-11-20 DIAGNOSIS — F329 Major depressive disorder, single episode, unspecified: Secondary | ICD-10-CM | POA: Diagnosis present

## 2011-11-20 HISTORY — DX: Pneumonia, unspecified organism: J18.9

## 2011-11-20 HISTORY — DX: Nausea with vomiting, unspecified: Z98.890

## 2011-11-20 HISTORY — DX: Mental disorder, not otherwise specified: F99

## 2011-11-20 HISTORY — DX: Dislocation of jaw, unspecified side, initial encounter: S03.00XA

## 2011-11-20 HISTORY — DX: Respiratory tuberculosis unspecified: A15.9

## 2011-11-20 HISTORY — DX: Acute myocardial infarction, unspecified: I21.9

## 2011-11-20 HISTORY — DX: Fibromyalgia: M79.7

## 2011-11-20 HISTORY — DX: Gastric ulcer, unspecified as acute or chronic, without hemorrhage or perforation: K25.9

## 2011-11-20 HISTORY — DX: Unspecified osteoarthritis, unspecified site: M19.90

## 2011-11-20 HISTORY — DX: Cardiac arrhythmia, unspecified: I49.9

## 2011-11-20 HISTORY — DX: Anemia, unspecified: D64.9

## 2011-11-20 HISTORY — DX: Shortness of breath: R06.02

## 2011-11-20 HISTORY — DX: Atherosclerotic heart disease of native coronary artery without angina pectoris: I25.10

## 2011-11-20 HISTORY — DX: Essential (primary) hypertension: I10

## 2011-11-20 HISTORY — DX: Unspecified atrial fibrillation: I48.91

## 2011-11-20 HISTORY — DX: Gastro-esophageal reflux disease without esophagitis: K21.9

## 2011-11-20 HISTORY — DX: Headache: R51

## 2011-11-20 HISTORY — DX: Other chronic pain: G89.29

## 2011-11-20 HISTORY — DX: Nausea with vomiting, unspecified: R11.2

## 2011-11-20 HISTORY — DX: Dorsalgia, unspecified: M54.9

## 2011-11-20 HISTORY — DX: Acute upper respiratory infection, unspecified: J06.9

## 2011-11-20 HISTORY — DX: Cardiac murmur, unspecified: R01.1

## 2011-11-20 HISTORY — DX: Personal history of other diseases of the nervous system and sense organs: Z86.69

## 2011-11-20 HISTORY — DX: Angina pectoris, unspecified: I20.9

## 2011-11-20 LAB — DIFFERENTIAL
Basophils Relative: 0 % (ref 0–1)
Eosinophils Relative: 1 % (ref 0–5)
Monocytes Absolute: 0.4 10*3/uL (ref 0.1–1.0)
Monocytes Relative: 7 % (ref 3–12)
Neutro Abs: 4.1 10*3/uL (ref 1.7–7.7)

## 2011-11-20 LAB — COMPREHENSIVE METABOLIC PANEL
ALT: 26 U/L (ref 0–35)
AST: 25 U/L (ref 0–37)
Albumin: 3.6 g/dL (ref 3.5–5.2)
Alkaline Phosphatase: 64 U/L (ref 39–117)
CO2: 26 mEq/L (ref 19–32)
Chloride: 104 mEq/L (ref 96–112)
Creatinine, Ser: 0.62 mg/dL (ref 0.50–1.10)
GFR calc non Af Amer: >90 mL/min (ref >90)
Potassium: 3 mEq/L — ABNORMAL LOW (ref 3.5–5.1)
Sodium: 141 mEq/L (ref 135–145)
Total Bilirubin: 0.3 mg/dL (ref 0.3–1.2)

## 2011-11-20 LAB — CBC
Platelets: 237 10*3/uL (ref 150–400)
RBC: 5.14 MIL/uL — ABNORMAL HIGH (ref 3.87–5.11)
RDW: 13 % (ref 11.5–15.5)
WBC: 5.9 10*3/uL (ref 4.0–10.5)

## 2011-11-20 MED ORDER — POTASSIUM CHLORIDE CRYS ER 20 MEQ PO TBCR
20.0000 meq | EXTENDED_RELEASE_TABLET | Freq: Two times a day (BID) | ORAL | Status: DC
  Administered 2011-11-20 – 2011-11-21 (×3): 20 meq via ORAL
  Filled 2011-11-20 (×5): qty 1

## 2011-11-20 MED ORDER — OXYCODONE HCL 5 MG PO TABS: 5.0000 mg | ORAL_TABLET | ORAL | Status: DC | PRN

## 2011-11-20 MED ORDER — ALBUTEROL SULFATE (5 MG/ML) 0.5% IN NEBU: 2.5000 mg | INHALATION_SOLUTION | RESPIRATORY_TRACT | Status: DC | PRN

## 2011-11-20 MED ORDER — ZOLPIDEM TARTRATE 5 MG PO TABS: 5.0000 mg | ORAL_TABLET | Freq: Every evening | ORAL | Status: DC | PRN

## 2011-11-20 MED ORDER — SODIUM CHLORIDE 0.9 % IJ SOLN: 3.0000 mL | INTRAMUSCULAR | Status: DC | PRN

## 2011-11-20 MED ORDER — ONDANSETRON HCL 4 MG/2ML IJ SOLN
4.0000 mg | Freq: Four times a day (QID) | INTRAMUSCULAR | Status: DC | PRN
  Administered 2011-11-20 – 2011-11-21 (×4): 4 mg via INTRAVENOUS
  Filled 2011-11-20 (×4): qty 2

## 2011-11-20 MED ORDER — INSULIN ASPART 100 UNIT/ML ~~LOC~~ SOLN
0.0000 [IU] | Freq: Three times a day (TID) | SUBCUTANEOUS | Status: DC
  Administered 2011-11-21 (×2): 7 [IU] via SUBCUTANEOUS
  Administered 2011-11-21 – 2011-11-22 (×2): 4 [IU] via SUBCUTANEOUS
  Filled 2011-11-20: qty 3

## 2011-11-20 MED ORDER — ADULT MULTIVITAMIN W/MINERALS CH
1.0000 | ORAL_TABLET | Freq: Every day | ORAL | Status: DC
  Administered 2011-11-21 – 2011-11-22 (×2): 1 via ORAL
  Filled 2011-11-20 (×3): qty 1

## 2011-11-20 MED ORDER — ASPIRIN 81 MG PO CHEW
81.0000 mg | CHEWABLE_TABLET | Freq: Every day | ORAL | Status: DC
  Administered 2011-11-21 – 2011-11-22 (×2): 81 mg via ORAL
  Filled 2011-11-20 (×2): qty 1

## 2011-11-20 MED ORDER — HYDROMORPHONE HCL PF 1 MG/ML IJ SOLN
0.5000 mg | INTRAMUSCULAR | Status: DC | PRN
  Administered 2011-11-20 – 2011-11-21 (×5): 0.5 mg via INTRAVENOUS
  Filled 2011-11-20 (×5): qty 1

## 2011-11-20 MED ORDER — INSULIN ASPART 100 UNIT/ML ~~LOC~~ SOLN
0.0000 [IU] | Freq: Every day | SUBCUTANEOUS | Status: DC
  Administered 2011-11-21: 2 [IU] via SUBCUTANEOUS
  Filled 2011-11-20: qty 3

## 2011-11-20 MED ORDER — SODIUM CHLORIDE 0.9 % IJ SOLN
3.0000 mL | Freq: Two times a day (BID) | INTRAMUSCULAR | Status: DC
  Administered 2011-11-21 (×2): 3 mL via INTRAVENOUS

## 2011-11-20 MED ORDER — PANTOPRAZOLE SODIUM 40 MG PO TBEC
40.0000 mg | DELAYED_RELEASE_TABLET | Freq: Every day | ORAL | Status: DC
  Administered 2011-11-21 – 2011-11-22 (×2): 40 mg via ORAL
  Filled 2011-11-20 (×2): qty 1

## 2011-11-20 MED ORDER — ACETAMINOPHEN 650 MG RE SUPP: 650.0000 mg | Freq: Four times a day (QID) | RECTAL | Status: DC | PRN

## 2011-11-20 MED ORDER — GABAPENTIN 300 MG PO CAPS
300.0000 mg | ORAL_CAPSULE | Freq: Three times a day (TID) | ORAL | Status: DC
  Administered 2011-11-20 – 2011-11-22 (×5): 300 mg via ORAL
  Filled 2011-11-20 (×7): qty 1

## 2011-11-20 MED ORDER — ALBUTEROL SULFATE (5 MG/ML) 0.5% IN NEBU
2.5000 mg | INHALATION_SOLUTION | Freq: Four times a day (QID) | RESPIRATORY_TRACT | Status: DC
  Administered 2011-11-20: 2.5 mg via RESPIRATORY_TRACT
  Filled 2011-11-20: qty 0.5

## 2011-11-20 MED ORDER — MOMETASONE FURO-FORMOTEROL FUM 100-5 MCG/ACT IN AERO
2.0000 | INHALATION_SPRAY | Freq: Every day | RESPIRATORY_TRACT | Status: DC
  Administered 2011-11-21: 2 via RESPIRATORY_TRACT

## 2011-11-20 MED ORDER — ALUM & MAG HYDROXIDE-SIMETH 200-200-20 MG/5ML PO SUSP
30.0000 mL | Freq: Four times a day (QID) | ORAL | Status: DC | PRN
  Filled 2011-11-20: qty 30

## 2011-11-20 MED ORDER — OXYCODONE HCL 20 MG PO TB12
20.0000 mg | ORAL_TABLET | Freq: Every day | ORAL | Status: DC
  Filled 2011-11-20 (×3): qty 1

## 2011-11-20 MED ORDER — METHYLPREDNISOLONE SODIUM SUCC 125 MG IJ SOLR
80.0000 mg | Freq: Four times a day (QID) | INTRAMUSCULAR | Status: DC
  Administered 2011-11-21 (×2): 80 mg via INTRAVENOUS
  Filled 2011-11-20: qty 1.28

## 2011-11-20 MED ORDER — ENOXAPARIN SODIUM 40 MG/0.4ML ~~LOC~~ SOLN
40.0000 mg | SUBCUTANEOUS | Status: DC
  Administered 2011-11-20 – 2011-11-21 (×2): 40 mg via SUBCUTANEOUS
  Filled 2011-11-20 (×3): qty 0.4

## 2011-11-20 MED ORDER — DIVALPROEX SODIUM ER 500 MG PO TB24
500.0000 mg | ORAL_TABLET | Freq: Every day | ORAL | Status: DC
Start: 2011-11-20 — End: 2011-11-22
  Administered 2011-11-20 – 2011-11-21 (×2): 500 mg via ORAL
  Filled 2011-11-20 (×3): qty 1

## 2011-11-20 MED ORDER — INSULIN ASPART 100 UNIT/ML ~~LOC~~ SOLN
6.0000 [IU] | Freq: Three times a day (TID) | SUBCUTANEOUS | Status: DC
  Administered 2011-11-21 – 2011-11-22 (×4): 6 [IU] via SUBCUTANEOUS
  Filled 2011-11-20: qty 3

## 2011-11-20 MED ORDER — ONDANSETRON HCL 4 MG PO TABS: 4.0000 mg | ORAL_TABLET | Freq: Four times a day (QID) | ORAL | Status: DC | PRN

## 2011-11-20 MED ORDER — SENNA 8.6 MG PO TABS
1.0000 | ORAL_TABLET | Freq: Two times a day (BID) | ORAL | Status: DC
  Administered 2011-11-20 – 2011-11-22 (×3): 8.6 mg via ORAL
  Filled 2011-11-20 (×5): qty 1

## 2011-11-20 MED ORDER — ACETAMINOPHEN 325 MG PO TABS: 650.0000 mg | ORAL_TABLET | Freq: Four times a day (QID) | ORAL | Status: DC | PRN

## 2011-11-20 MED ORDER — ISOSORBIDE MONONITRATE ER 60 MG PO TB24
60.0000 mg | ORAL_TABLET | Freq: Every day | ORAL | Status: DC
  Administered 2011-11-21 – 2011-11-22 (×2): 60 mg via ORAL
  Filled 2011-11-20 (×3): qty 1

## 2011-11-20 MED ORDER — IPRATROPIUM BROMIDE 0.02 % IN SOLN
0.5000 mg | Freq: Four times a day (QID) | RESPIRATORY_TRACT | Status: DC
  Administered 2011-11-20: 0.5 mg via RESPIRATORY_TRACT
  Filled 2011-11-20: qty 2.5

## 2011-11-20 MED ORDER — METOPROLOL SUCCINATE ER 50 MG PO TB24
50.0000 mg | ORAL_TABLET | Freq: Every day | ORAL | Status: DC
Start: 2011-11-20 — End: 2011-11-22
  Administered 2011-11-21 – 2011-11-22 (×2): 50 mg via ORAL
  Filled 2011-11-20 (×3): qty 1

## 2011-11-20 MED ORDER — SODIUM CHLORIDE 0.9 % IV SOLN
250.0000 mL | INTRAVENOUS | Status: DC | PRN
  Administered 2011-11-21: 250 mL via INTRAVENOUS

## 2011-11-20 NOTE — H&P (Signed)
PATIENT DETAILS Name: Shelby Anderson Age: 55 y.o. Sex: female Date of Birth: [birth year] Admit Date: 11/20/2011 PCP:No primary provider on file.   CHIEF COMPLAINT:  Shortness of breath  HPI: 55 y.o female w PMHx asthma and cad p/w worsening sob and cough over the last 2 weeks. No associated, fever, chills or diaphoresis. She admits to sputum production. She has tried albuterol at home w/o relief. She denies chest pain or syncope. No exposure to sick contacts. No recent travel hx.   ALLERGIES:   Allergies  Allergen Reactions  . Aspirin Other (See Comments)    GI Bleeds  . Darvocet (Propoxyphene N-Acetaminophen) Hives  . Lodine (Etodolac) Hives and Swelling  . Penicillins Other (See Comments)    unknown  . Ultram (Tramadol Hcl) Hives  . Valium Other (See Comments)    Circulation problems    PAST MEDICAL HISTORY: Past Medical History  Diagnosis Date  . Angina   . Heart murmur   . Asthma   . Mental disorder   . Coronary artery disease   . Hypertension   . Recurrent upper respiratory infection (URI)   . Tuberculosis     + TB SKIN TEST  . GERD (gastroesophageal reflux disease)   . Headache   . Arthritis   . Anxiety   . Pneumonia   . Depression   . TMJ (dislocation of temporomandibular joint)   . Difficult intubation     "TMJ & woke up when they were still cutting on me"  . PONV (postoperative nausea and vomiting)   . Dysrhythmia   . Atrial fibrillation     h/o "AF w/frequent PVCs"  . Myocardial infarction 1980's & 1990;  . Shortness of breath 11/20/11    "all the time"  . Asthmatic bronchitis     "seasonal"  . Anemia     "chronic"  . H/O hiatal hernia   . Stomach ulcer     "3 small; found in 05/2011"  . History of migraines     "dx'd when I was in my teens"  . Fibromyalgia     "in my legs"  . Chronic back pain greater than 3 months duration     PAST SURGICAL HISTORY: Past Surgical History  Procedure Date  . Carpal tunnel release unknown   left hand  . Achilles tendon repair 1970's    left ankle  . Arthroscopic repair acl     left knee cap  . Cardiac catheterization     loop recorder  . Post ganglionectomy 1970's    "for migraine headaches"  . Tubal ligation 1980's  . Pouch string     "did this 3 times (once w/each pregnancy)"    MEDICATIONS AT HOME: Prior to Admission medications   Medication Sig Start Date End Date Taking? Authorizing Provider  albuterol (PROVENTIL HFA;VENTOLIN HFA) 108 (90 BASE) MCG/ACT inhaler Inhale 2 puffs into the lungs every 4 (four) hours as needed. Shortness of breath    Yes Historical Provider, MD  aspirin 81 MG EC tablet Take 81 mg by mouth daily. Swallow whole.    Yes Historical Provider, MD  Calcium Carbonate-Vitamin D (CALCIUM + D PO) Take 1 tablet by mouth 3 (three) times daily.     Yes Historical Provider, MD  divalproex (DEPAKOTE ER) 500 MG 24 hr tablet Take 500 mg by mouth at bedtime.     Yes Historical Provider, MD  esomeprazole (NEXIUM) 40 MG capsule Take 40 mg by mouth 2 (two) times daily.  Yes Historical Provider, MD  eszopiclone (LUNESTA) 2 MG TABS Take 2 mg by mouth at bedtime as needed. For sleep    Yes Historical Provider, MD  fish oil-omega-3 fatty acids 1000 MG capsule Take 1 g by mouth 2 (two) times daily.     Yes Historical Provider, MD  gabapentin (NEURONTIN) 300 MG capsule Take 300 mg by mouth 3 (three) times daily.     Yes Historical Provider, MD  GLUCOSAMINE PO Take 1 tablet by mouth 2 (two) times daily.     Yes Historical Provider, MD  isosorbide mononitrate (IMDUR) 60 MG 24 hr tablet Take 60 mg by mouth daily.     Yes Historical Provider, MD  L-Methylfolate (DEPLIN) 7.5 MG TABS Take 7.5 mg by mouth daily.     Yes Historical Provider, MD  lidocaine (LIDODERM) 5 % Place 2 patches onto the skin daily. Remove & Discard patch within 12 hours or as directed by MD    Yes Historical Provider, MD  metoprolol (TOPROL-XL) 50 MG 24 hr tablet Take 50 mg by mouth daily.     Yes  Historical Provider, MD  mometasone-formoterol (DULERA) 100-5 MCG/ACT AERO Inhale 2 puffs into the lungs daily.     Yes Historical Provider, MD  Multiple Vitamin (MULITIVITAMIN WITH MINERALS) TABS Take 1 tablet by mouth daily.     Yes Historical Provider, MD  oxyCODONE (OXYCONTIN) 20 MG 12 hr tablet Take 20 mg by mouth 5 (five) times daily.     Yes Historical Provider, MD  promethazine (PHENERGAN) 25 MG tablet Take 25 mg by mouth at bedtime as needed. For nausea    Yes Historical Provider, MD    FAMILY HISTORY: Family History  Problem Relation Age of Onset  . Malignant hyperthermia Father   . Anesthesia problems Neg Hx   . Hypotension Neg Hx   . Pseudochol deficiency Neg Hx     SOCIAL HISTORY:  reports that she has never smoked. She has never used smokeless tobacco. She reports that she does not drink alcohol or use illicit drugs.  REVIEW OF SYSTEMS:  Constitutional:   No  weight loss, night sweats,  Fevers, chills, fatigue.  HEENT:    No headaches, Difficulty swallowing,Tooth/dental problems,Sore throat,  No sneezing, itching, ear ache, nasal congestion, post nasal drip,   Cardio-vascular: No chest pain,  Orthopnea, PND, swelling in lower extremities, anasarca,         dizziness, palpitations  GI:  No heartburn, indigestion, abdominal pain, nausea, vomiting, diarrhea, change in       bowel habits, loss of appetite  Resp: Cough x 2 weeks, productive of white-greenish sputum. No chest pain. No palpitations.  Skin:  no rash or lesions.  GU:  no dysuria, change in color of urine, no urgency or frequency.  No flank pain.  Musculoskeletal: No joint pain or swelling.  No decreased range of motion.  No back pain.  Psych: No change in mood or affect. No depression or anxiety.  No memory loss.   PHYSICAL EXAM: Blood pressure 121/80, pulse 72, temperature 98.6 F (37 C), temperature source Oral, resp. rate 20, height 5\' 6"  (1.676 m), weight 227 lb 11.8 oz (103.3 kg), SpO2  96.00%.  General appearance :Awake, alert, not in any distress. Speech Clear. Not toxic Looking HEENT: Atraumatic and Normocephalic, pupils equally reactive to light and accomodation Neck: supple, no JVD. No cervical lymphadenopathy.  Chest:diminished air entry bilaterally, inspiratory and expiratory wheezes and ronchiCVS: S1 S2 regular, no murmurs.  Abdomen: Bowel  sounds present, Non tender and not distended with no gaurding, rigidity or rebound. Extremities: B/L Lower Ext shows no edema, both legs are warm to touch, with  dorsalis pedis pulses palpable. Neurology: Awake alert, and oriented X 3, CN II-XII intact, Non focal, Deep Tendon Reflex-2+ all over, plantar's downgoing B/L, sensory exam is grossly intact.  Skin:No Rash Wounds:N/A  LABS ON ADMISSION:  No results found for this basename: NA:2,K:2,CL:2,CO2:2,GLUCOSE:2,BUN:2,CREATININE:2,CALCIUM:2,MG:2,PHOS:2 in the last 72 hours No results found for this basename: AST:2,ALT:2,ALKPHOS:2,BILITOT:2,PROT:2,ALBUMIN:2 in the last 72 hours No results found for this basename: LIPASE:2,AMYLASE:2 in the last 72 hours No results found for this basename: WBC:2,NEUTROABS:2,HGB:2,HCT:2,MCV:2,PLT:2 in the last 72 hours No results found for this basename: CKTOTAL:3,CKMB:3,CKMBINDEX:3,TROPONINI:3 in the last 72 hours No results found for this basename: DDIMER:2 in the last 72 hours No components found with this basename: POCBNP:3   RADIOLOGIC STUDIES ON ADMISSION: No results found.  ASSESSMENT AND PLAN: Present on Admission:  .Asthma exacerbation  Oxygen Tx Albuterol and atrovent nebs q 4 h Solumedrol 80 mg IV q 8 and then taper. STAT CXR STAT EKG CBC W DIFF, CMP. Monitor glucose + ISS if needed.   Further plan will depend as patient's clinical course evolves and further radiologic and laboratory data become available. Patient will be monitored closely.   DVT Prophylaxis: Lovenox  Code Status: full  Total time spent for admission  equals 45 minutes.  Jonny Ruiz 11/20/2011, 7:09 PM

## 2011-11-21 DIAGNOSIS — E876 Hypokalemia: Secondary | ICD-10-CM | POA: Diagnosis present

## 2011-11-21 LAB — BASIC METABOLIC PANEL
BUN: 11 mg/dL (ref 6–23)
CO2: 24 mEq/L (ref 19–32)
Chloride: 108 mEq/L (ref 96–112)
GFR calc Af Amer: >90 mL/min (ref >90)
Potassium: 3.4 mEq/L — ABNORMAL LOW (ref 3.5–5.1)

## 2011-11-21 LAB — CARDIAC PANEL(CRET KIN+CKTOT+MB+TROPI)
Relative Index: 3.7 — ABNORMAL HIGH (ref 0.0–2.5)
Relative Index: 4.8 — ABNORMAL HIGH (ref 0.0–2.5)
Total CK: 105 U/L (ref 7–177)
Total CK: 89 U/L (ref 7–177)
Troponin I: <0.3 ng/mL (ref <0.30)
Troponin I: <0.3 ng/mL (ref <0.30)

## 2011-11-21 LAB — GLUCOSE, CAPILLARY: Glucose-Capillary: 245 mg/dL — ABNORMAL HIGH (ref 70–99)

## 2011-11-21 MED ORDER — ALBUTEROL SULFATE (5 MG/ML) 0.5% IN NEBU
2.5000 mg | INHALATION_SOLUTION | Freq: Four times a day (QID) | RESPIRATORY_TRACT | Status: DC | PRN
  Administered 2011-11-21: 2.5 mg via RESPIRATORY_TRACT
  Filled 2011-11-21: qty 0.5

## 2011-11-21 MED ORDER — POTASSIUM CHLORIDE CRYS ER 20 MEQ PO TBCR: 20.0000 meq | EXTENDED_RELEASE_TABLET | Freq: Two times a day (BID) | ORAL | Status: DC

## 2011-11-21 MED ORDER — IPRATROPIUM BROMIDE 0.02 % IN SOLN
0.5000 mg | Freq: Four times a day (QID) | RESPIRATORY_TRACT | Status: DC | PRN
  Administered 2011-11-21: 0.5 mg via RESPIRATORY_TRACT
  Filled 2011-11-21: qty 2.5

## 2011-11-21 MED ORDER — PREDNISONE 50 MG PO TABS
60.0000 mg | ORAL_TABLET | Freq: Every day | ORAL | Status: DC
  Administered 2011-11-22: 60 mg via ORAL
  Filled 2011-11-21 (×3): qty 1

## 2011-11-21 MED ORDER — OXYCODONE HCL 5 MG PO TABS
10.0000 mg | ORAL_TABLET | ORAL | Status: DC | PRN
  Administered 2011-11-21 – 2011-11-22 (×5): 10 mg via ORAL
  Filled 2011-11-21 (×5): qty 2

## 2011-11-21 MED ORDER — ENSURE CLINICAL ST REVIGOR PO LIQD
237.0000 mL | Freq: Two times a day (BID) | ORAL | Status: DC
  Administered 2011-11-21: 20:00:00 via ORAL
  Administered 2011-11-21: 237 mL via ORAL
  Administered 2011-11-22: 10:00:00 via ORAL

## 2011-11-21 NOTE — Progress Notes (Addendum)
INITIAL ADULT NUTRITION ASSESSMENT Date: 11/21/2011   Time: 8:25 AM  Reason for Assessment: MD consult  ASSESSMENT: Female 55 y.o.  Dx: Asthma exacerbation  Hx:  Past Medical History  Diagnosis Date  . Angina   . Heart murmur   . Asthma   . Mental disorder   . Coronary artery disease   . Hypertension   . Recurrent upper respiratory infection (URI)   . Tuberculosis     + TB SKIN TEST  . GERD (gastroesophageal reflux disease)   . Headache   . Arthritis   . Anxiety   . Pneumonia   . Depression   . TMJ (dislocation of temporomandibular joint)   . Difficult intubation     "TMJ & woke up when they were still cutting on me"  . PONV (postoperative nausea and vomiting)   . Dysrhythmia   . Atrial fibrillation     h/o "AF w/frequent PVCs"  . Myocardial infarction 1980's & 1990;  . Shortness of breath 11/20/11    "all the time"  . Asthmatic bronchitis     "seasonal"  . Anemia     "chronic"  . H/O hiatal hernia   . Stomach ulcer     "3 small; found in 05/2011"  . History of migraines     "dx'd when I was in my teens"  . Fibromyalgia     "in my legs"  . Chronic back pain greater than 3 months duration    Related Meds:     . aspirin  81 mg Oral Daily  . divalproex  500 mg Oral QHS  . enoxaparin  40 mg Subcutaneous Q24H  . gabapentin  300 mg Oral TID  . insulin aspart  0-20 Units Subcutaneous TID WC  . insulin aspart  0-5 Units Subcutaneous QHS  . insulin aspart  6 Units Subcutaneous TID WC  . isosorbide mononitrate  60 mg Oral Daily  . methylPREDNISolone (SOLU-MEDROL) injection  80 mg Intravenous Q6H  . metoprolol  50 mg Oral Daily  . mometasone-formoterol  2 puff Inhalation Daily  . mulitivitamin with minerals  1 tablet Oral Daily  . oxyCODONE  20 mg Oral 5 X Daily  . pantoprazole  40 mg Oral Daily  . potassium chloride  20 mEq Oral BID  . senna  1 tablet Oral BID  . sodium chloride  3 mL Intravenous Q12H  . DISCONTD: albuterol  2.5 mg Nebulization Q6H  .  DISCONTD: ipratropium  0.5 mg Nebulization Q6H   Ht: 5\' 6"  (167.6 cm)  Wt: 227 lb 11.8 oz (103.3 kg)  Ideal Wt: 59.1 kg % Ideal Wt: 175%  Usual Wt: 111.4 kg in September % Usual Wt: 93%  Body mass index is 36.76 kg/(m^2). Pt is overweight  Food/Nutrition Related Hx: poor PO intake x 3 months  Labs:  CMP     Component Value Date/Time   NA 142 11/21/2011 0414   K 3.4* 11/21/2011 0414   CL 108 11/21/2011 0414   CO2 24 11/21/2011 0414   GLUCOSE 159* 11/21/2011 0414   BUN 11 11/21/2011 0414   CREATININE 0.59 11/21/2011 0414   CALCIUM 9.6 11/21/2011 0414   PROT 7.6 11/20/2011 1844   ALBUMIN 3.6 11/20/2011 1844   AST 25 11/20/2011 1844   ALT 26 11/20/2011 1844   ALKPHOS 64 11/20/2011 1844   BILITOT 0.3 11/20/2011 1844   GFRNONAA >90 11/21/2011 0414   GFRAA >90 11/21/2011 0414   CBG (last 3)   Basename 11/21/11 7829  11/20/11 2124-05-05  GLUCAP 187* 148*    Intake/Output: I/O last 3 completed shifts: In: 480 [P.O.:480] Out: -     Diet Order: Cardiac  Supplements/Tube Feeding: none  IVF:    Estimated Nutritional Needs:   Kcal:  1800 - 2000 Protein:  80 - 95 g Fluid:  1.8 - 2 L/d  Pt reports that she has been having nausea/vomiting after meals, has been instructed to consume small frequent meals in the past. Unable to tolerate many foods. Able to eat baked potoatos and drink coke/water. Pt reports 30# wt loss since September r/t these issues. This is a 12% wt change x 3 months. Pt meets criteria for severe protein calorie malnutrition in the context of acute illness. Pt may be at risk for refeeding syndrome given her hx of poor PO intake x 3 months. This RD asked pt if she has ever had any work-up done for her current GI symptoms and she said "no, I don't have insurance so I can't afford to."  NUTRITION DIAGNOSIS: -Inadequate protein energy intake (NI-5.3).  Status: Ongoing  RELATED TO: GI distress  AS EVIDENCE BY: pt report.  MONITORING/EVALUATION(Goals): Goal:  Pt to consume >/= 75% of meals on average Monitor: weights, labs, PO intake, I/O's  EDUCATION NEEDS: -No education needs identified at this time  INTERVENTION: 1. Consider GI consult given patient report of vomiting after meals and limited tolerance of foods 2. Recommend social work consult given patient report of financial distress 3. Ensure Clinical Strength PO BID 4. RD to follow nutrition care plan  Dietitian #: [phone]  DOCUMENTATION CODES Per approved criteria  -Severe malnutrition in the context of acute illness or injury -Obesity Unspecified    Adair Laundry 11/21/2011, 8:25 AM

## 2011-11-21 NOTE — Progress Notes (Signed)
11/21/11 Spoke with patient about PCP. She stated that her current PCP would not see her any more due to payment issues. Patient agreeable to eligibility appt and f/u appt at [facility]. Made first available appt for eligibility 12/25/11 at 10:30am and f/u appt 01/18/12 at 2:30pm with Dr. Andrey Campanile. Gave patient appt times and list of what to take to eligibility appt. Appts placed in Epic.Gave patient discount pharmacy card. Patient eligible for [employer] fund. Will cont to follow. Jacquelynn Cree RN, BSN, CCM

## 2011-11-21 NOTE — Progress Notes (Signed)
Chaplain's Note:  Pt's room was dark when I entered.  I introduced myself and pt was eager for visit.  Pt was emotional/crying.  Pt has many concerns including employment, finances, health and family.  I provided spiritual and emotional support.  We ended the visit with prayer.  Please page me if I am needed or requested. Dellie Catholic  [phone]/  Lockie Mola [phone]  11/21/11 1552  Clinical Encounter Type  Visited With Patient  Visit Type Initial  Referral From Nurse  Consult/Referral To Social work  Spiritual Encounters  Spiritual Needs Prayer;Emotional  Stress Factors  Patient Stress Factors Family relationships;Financial concerns;Health changes;Loss of control;Major life changes  Family Stress Factors Not reviewed

## 2011-11-21 NOTE — Progress Notes (Signed)
Pt ambulated in the hall on room air. Her o2 sats ranged between 90-92%. She stated she felt some sob but not severe. Did not require any o2. Pt's o2 sats at rest after ambulating was 93%. No distress noted. Will cont to monitor.

## 2011-11-21 NOTE — Progress Notes (Signed)
Subjective: Patient relates her shortness of breath is much improved. She relates she still has difficulty when she ambulates without oxygen. Objective: Filed Vitals:   11/21/11 0522 11/21/11 0934 11/21/11 1000 11/21/11 1215  BP: 122/67  117/74   Pulse: 62  71   Temp: 98.7 F (37.1 C)  97.9 F (36.6 C)   TempSrc: Oral  Oral   Resp: 62  16   Height:      Weight:      SpO2: 91% 96% 91% 93%   Weight change:   Intake/Output Summary (Last 24 hours) at 11/21/11 1358 Last data filed at 11/21/11 1045  Gross per 24 hour  Intake    723 ml  Output      0 ml  Net    723 ml    General: Alert, awake, oriented x3, in no acute distress.  HEENT: No bruits, no goiter.  Heart: Regular rate and rhythm, without murmurs, rubs, gallops.  Lungs: Crackles left side, bilateral air movement.  Abdomen: Soft, nontender, nondistended, positive bowel sounds.  Neuro: Grossly intact, nonfocal.   Lab Results:  [facility] 11/21/11 0414 11/20/11 1844  NA 142 141  K 3.4* 3.0*  CL 108 104  CO2 24 26  GLUCOSE 159* 104*  BUN 11 12  CREATININE 0.59 0.62  CALCIUM 9.6 9.7  MG -- --  PHOS -- --    Basename 11/20/11 1844  AST 25  ALT 26  ALKPHOS 64  BILITOT 0.3  PROT 7.6  ALBUMIN 3.6    Basename 11/20/11 1844  WBC 5.9  NEUTROABS 4.1  HGB 15.1*  HCT 45.0  MCV 87.5  PLT 237    Basename 11/21/11 1000 11/21/11 0414  CKTOTAL 105 130  CKMB 5.0* 4.8*  CKMBINDEX -- --  TROPONINI <0.30 <0.30    Micro Results: No results found for this or any previous visit (from the past 240 hour(s)).  Studies/Results: X-ray Chest Pa And Lateral   11/20/2011  *RADIOLOGY REPORT*  Clinical Data: Cough and shortness of breath.  CHEST - 2 VIEW  Comparison: 08/13/2011  Findings: Lungs show no evidence of edema or infiltrate.  There is atelectasis and scarring at the right lung base.  Stable positioning of implanted loop recorder.  The heart size is normal. No pleural effusions.  Bony thorax is unremarkable.   IMPRESSION: No acute findings.  Atelectasis/scarring at the right lung base.  Original Report Authenticated By: Reola Calkins, M.D.    Medications: I have reviewed the patient's current medications.   Principal Problem:  *Asthma exacerbation Active Problems:  Hypokalemia    Assessment and plan: -She relates her shortness of breath is better. She still has some wheezing on deep inspiration. We'll continue her on Advair, decrease her steroids and change him to by mouth. Lasting as to ambulate and measuring her pulse ox. She continues to have no fevers, her chest x-ray does not show any infiltrates and she doesn't have a white blood cell count bedtime. Cause of her asthma exacerbation could be secondary to noncompliance. We'll have to schedule a followup appointment with her pulmonologist to perform PFTs. As she relates this has gotten worse and over the last 3 months. And she is having frequent attacks of asthma. She  denies any smoking.  -We'll replete her potassium and check a magnesium level.  LOS: 1 day   Marinda Elk M.D. Pager: [phone] [facility] 11/21/2011, 1:58 PM

## 2011-11-21 NOTE — Progress Notes (Signed)
11/21/2011 Christophe Rising [facility] Case Management Note [phone]  Utilization review completed.  

## 2011-11-22 LAB — BASIC METABOLIC PANEL
BUN: 12 mg/dL (ref 6–23)
Chloride: 105 mEq/L (ref 96–112)
GFR calc Af Amer: >90 mL/min (ref >90)
Potassium: 4.2 mEq/L (ref 3.5–5.1)

## 2011-11-22 MED ORDER — POTASSIUM CHLORIDE CRYS ER 20 MEQ PO TBCR
40.0000 meq | EXTENDED_RELEASE_TABLET | Freq: Three times a day (TID) | ORAL | Status: DC
  Administered 2011-11-22: 40 meq via ORAL
  Filled 2011-11-22: qty 2

## 2011-11-22 MED ORDER — ALBUTEROL SULFATE HFA 108 (90 BASE) MCG/ACT IN AERS: 2.0000 | INHALATION_SPRAY | RESPIRATORY_TRACT | Status: DC | PRN

## 2011-11-22 MED ORDER — FLUTICASONE-SALMETEROL 500-50 MCG/DOSE IN AEPB: 1.0000 | INHALATION_SPRAY | Freq: Two times a day (BID) | RESPIRATORY_TRACT | Status: DC

## 2011-11-22 MED ORDER — FLUTICASONE-SALMETEROL 500-50 MCG/DOSE IN AEPB
1.0000 | INHALATION_SPRAY | Freq: Two times a day (BID) | RESPIRATORY_TRACT | Status: DC
  Administered 2011-11-22: 1 via RESPIRATORY_TRACT
  Filled 2011-11-22: qty 14

## 2011-11-22 MED ORDER — ALBUTEROL SULFATE HFA 108 (90 BASE) MCG/ACT IN AERS
2.0000 | INHALATION_SPRAY | RESPIRATORY_TRACT | Status: DC | PRN
  Administered 2011-11-22: 2 via RESPIRATORY_TRACT
  Filled 2011-11-22: qty 6.7

## 2011-11-22 MED ORDER — PREDNISONE 10 MG PO TABS: ORAL_TABLET | ORAL | Status: DC

## 2011-11-22 NOTE — Progress Notes (Signed)
Patient complains of headache 6/10 unrelieved by oxycodone IR 10mg  po. Patient has scheduled oxycontin 20 SR due at 2200.Patient states,"I'm allergic to oxycontin but I take oxycondone IR 20mg  five times daily at home. Call placed to Maren Reamer NP. Maren Reamer NP to review patient medications . me

## 2011-11-22 NOTE — Discharge Summary (Addendum)
Admit date: 11/20/2011 Discharge date: 11/22/2011  Primary Care Physician:  No primary provider on file.   Discharge Diagnoses:   Active Hospital Problems  Diagnoses Date Noted   . Asthma exacerbation 11/20/2011     Resolved Hospital Problems  Diagnoses Date Noted Date Resolved  . Hypokalemia 11/21/2011 11/22/2011     DISCHARGE MEDICATION: Current Discharge Medication List    START taking these medications   Details  Fluticasone-Salmeterol (ADVAIR) 500-50 MCG/DOSE AEPB Inhale 1 puff into the lungs 2 (two) times daily. Qty: 60 each, Refills: 2    predniSONE (DELTASONE) 10 MG tablet Takes 6 tablets for 2 days, then 5 tablets for 2 days, then 4 tablets for 2 days, then 3 tablets for 2 days, then 2 tabs for 2 days, then 1 tab for 2 days, and then stop. Qty: 42 tablet, Refills: 0      CONTINUE these medications which have CHANGED   Details  albuterol (PROVENTIL HFA;VENTOLIN HFA) 108 (90 BASE) MCG/ACT inhaler Inhale 2 puffs into the lungs every 4 (four) hours as needed. Shortness of breath Qty: 1 Inhaler, Refills: 1      CONTINUE these medications which have NOT CHANGED   Details  aspirin 81 MG EC tablet Take 81 mg by mouth daily. Swallow whole.     Calcium Carbonate-Vitamin D (CALCIUM + D PO) Take 1 tablet by mouth 3 (three) times daily.      divalproex (DEPAKOTE ER) 500 MG 24 hr tablet Take 500 mg by mouth at bedtime.      esomeprazole (NEXIUM) 40 MG capsule Take 40 mg by mouth 2 (two) times daily.      eszopiclone (LUNESTA) 2 MG TABS Take 2 mg by mouth at bedtime as needed. For sleep     fish oil-omega-3 fatty acids 1000 MG capsule Take 1 g by mouth 2 (two) times daily.      gabapentin (NEURONTIN) 300 MG capsule Take 300 mg by mouth 3 (three) times daily.      GLUCOSAMINE PO Take 1 tablet by mouth 2 (two) times daily.      isosorbide mononitrate (IMDUR) 60 MG 24 hr tablet Take 60 mg by mouth daily.      L-Methylfolate (DEPLIN) 7.5 MG TABS Take 7.5 mg by mouth daily.       lidocaine (LIDODERM) 5 % Place 2 patches onto the skin daily. Remove & Discard patch within 12 hours or as directed by MD     metoprolol (TOPROL-XL) 50 MG 24 hr tablet Take 50 mg by mouth daily.      mometasone-formoterol (DULERA) 100-5 MCG/ACT AERO Inhale 2 puffs into the lungs daily.      Multiple Vitamin (MULITIVITAMIN WITH MINERALS) TABS Take 1 tablet by mouth daily.      oxyCODONE (OXYCONTIN) 20 MG 12 hr tablet Take 20 mg by mouth 5 (five) times daily.      promethazine (PHENERGAN) 25 MG tablet Take 25 mg by mouth at bedtime as needed. For nausea            Consults:     SIGNIFICANT DIAGNOSTIC STUDIES:  X-ray Chest Pa And Lateral   11/20/2011  *RADIOLOGY REPORT*  Clinical Data: Cough and shortness of breath.  CHEST - 2 VIEW  Comparison: 08/13/2011  Findings: Lungs show no evidence of edema or infiltrate.  There is atelectasis and scarring at the right lung base.  Stable positioning of implanted loop recorder.  The heart size is normal. No pleural effusions.  Bony thorax is unremarkable.  IMPRESSION: No acute findings.  Atelectasis/scarring at the right lung base.  Original Report Authenticated By: Reola Calkins, M.D.  No results found for this or any previous visit (from the past 240 hour(s)).  BRIEF ADMITTING H & P: 55 y.o female w PMHx asthma and cad p/w worsening sob and cough over the last 2 weeks. No associated, fever, chills or diaphoresis. She admits to sputum production. She has tried albuterol at home w/o relief. She denies chest pain or syncope. No exposure to sick contacts. No recent travel hx.   Active Hospital Problems  Diagnoses Date Noted   . Asthma exacerbation: On admission she was started on Advair IV steroids and albuterol. Antibiotics were held as she didn't have a white count or fever. She improved quite quickly. To the point where she was ambulatory and without oxygen satting 99% on room air. So she was discharged on steroid taper and continue  Advair. She has an appointment with [facility] on the on January for eligibility. The most likely cause of her exacerbation is medication noncompliance.  11/20/2011     Resolved Hospital Problems  Diagnoses Date Noted Date Resolved  . Hypokalemia: This probably secondary to the albuterol use. This was repleted and resolved.  11/21/2011 11/22/2011     Disposition and Follow-up:  Discharge Orders    Future Orders Please Complete By Expires   Diet - low sodium heart healthy      Increase activity slowly        Follow-up Information    Follow up with [facility] on 12/25/2011. (eligibility appt at 10:30am )    Contact information:   [phone]      Follow up with [facility] on 01/18/2012. (appt with Dr. Andrey Campanile at 2:30pm)    Contact information:   [phone]          DISCHARGE EXAM:  General: Alert, awake, oriented x3, in no acute distress.  HEENT: No bruits, no goiter.  Heart: Regular rate and rhythm, without murmurs, rubs, gallops.  Lungs: Crackles left side, bilateral air movement.  Abdomen: Soft, nontender, nondistended, positive bowel sounds.  Neuro: Grossly intact, nonfocal.   Blood pressure 126/79, pulse 63, temperature 98 F (36.7 C), temperature source Oral, resp. rate 18, height 5\' 6"  (1.676 m), weight 103.5 kg (228 lb 2.8 oz), SpO2 93.00%.   Basename 11/21/11 0414 11/20/11 1844  NA 142 141  K 3.4* 3.0*  CL 108 104  CO2 24 26  GLUCOSE 159* 104*  BUN 11 12  CREATININE 0.59 0.62  CALCIUM 9.6 9.7  MG -- --  PHOS -- --    Basename 11/20/11 1844  AST 25  ALT 26  ALKPHOS 64  BILITOT 0.3  PROT 7.6  ALBUMIN 3.6   No results found for this basename: LIPASE:2,AMYLASE:2 in the last 72 hours  Basename 11/20/11 1844  WBC 5.9  NEUTROABS 4.1  HGB 15.1*  HCT 45.0  MCV 87.5  PLT 237    Signed: Marinda Elk M.D. 11/22/2011, 8:34 AM

## 2011-12-17 ENCOUNTER — Institutional Professional Consult (permissible substitution): Payer: Self-pay | Admitting: Pulmonary Disease

## 2011-12-18 ENCOUNTER — Encounter: Payer: Self-pay | Admitting: Pulmonary Disease

## 2011-12-19 ENCOUNTER — Encounter: Payer: Self-pay | Admitting: Pulmonary Disease

## 2011-12-19 ENCOUNTER — Ambulatory Visit (INDEPENDENT_AMBULATORY_CARE_PROVIDER_SITE_OTHER): Payer: Self-pay | Admitting: Pulmonary Disease

## 2011-12-19 VITALS — BP 132/94 | HR 119 | Temp 99.2°F | Ht 64.0 in | Wt 236.0 lb

## 2011-12-19 DIAGNOSIS — J45901 Unspecified asthma with (acute) exacerbation: Secondary | ICD-10-CM

## 2011-12-19 MED ORDER — MONTELUKAST SODIUM 10 MG PO TABS: 10.0000 mg | ORAL_TABLET | Freq: Every day | ORAL | Status: DC

## 2011-12-19 NOTE — Patient Instructions (Signed)
Start singulair 1 tab at bedtime SPirometry -pre /post - next week once you are off prednisone Stay on nexium for reflux OK to change to advair 1 puff twice daily once you start getting this medication from assistance. Call us for asthma flare- & we can send in rx for prednisone

## 2011-12-19 NOTE — Progress Notes (Signed)
  Subjective:    Patient ID: Shelby Anderson, female    DOB: 12/18/1955, 56 y.o.   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 She had 2 flares in October & again dec '12 responding quickly to prednisone tapers. She had some difficulty getting advair, now has dulera samples & has applied for pt assistance. On 1/3 appt with Dr Clelia Croft, another 15 day taper was given for chest tightness. She has been evaluated by DrTurner for CAD & has a loop recorder after evaluation at [facility] for syncope. EGD in 4/12 (Buccini) has shown peptic ulcer disease.  Her husband smokes but she denies any change in her environment. She has positive PPD. She denies seasonal allergies, post nasal drip or breakthrough heartburn    Review of Systems  Constitutional: Positive for fever and unexpected weight change.  HENT: Positive for ear pain, nosebleeds, congestion, sore throat, trouble swallowing, dental problem, postnasal drip and sinus pressure. Negative for rhinorrhea and sneezing.   Eyes: Positive for redness and itching.  Respiratory: Positive for cough, chest tightness, shortness of breath and wheezing.   Cardiovascular: Positive for palpitations and leg swelling.  Gastrointestinal: Positive for nausea and vomiting.  Genitourinary: Negative for dysuria.  Musculoskeletal: Positive for joint swelling.  Skin: Negative for rash.  Neurological: Positive for headaches.  Hematological: Bruises/bleeds easily.  Psychiatric/Behavioral: Positive for dysphoric mood. The patient is not nervous/anxious.        Objective:   Physical Exam  Gen. Pleasant,obese, in no distress, normal affect ENT - no lesions, no post nasal drip, class 2 airway Neck: No JVD, no thyromegaly, no carotid bruits Lungs: no use of accessory muscles, no dullness to percussion,  decreased without rales or rhonchi  Cardiovascular: Rhythm regular, heart sounds  normal, no murmurs or gallops, no peripheral edema Abdomen: soft and non-tender, no hepatosplenomegaly, BS normal. Musculoskeletal: No deformities, no cyanosis or clubbing Neuro:  alert, non focal       Assessment & Plan:

## 2011-12-20 NOTE — Assessment & Plan Note (Signed)
Moderate persistent with flares since oct'12 ? Etiology - Not usual suspects environment, sinuses, GERD Step up therapy by adding  singulair 1 tab at bedtime SPirometry -pre /post - next week once you are off prednisone Stay on nexium for reflux OK to change to advair 1 puff twice daily once you start getting this medication from assistance. Call us for asthma flare- & we can send in rx for prednisone

## 2011-12-31 ENCOUNTER — Ambulatory Visit (INDEPENDENT_AMBULATORY_CARE_PROVIDER_SITE_OTHER): Payer: Self-pay | Admitting: Pulmonary Disease

## 2011-12-31 ENCOUNTER — Telehealth: Payer: Self-pay | Admitting: Pulmonary Disease

## 2011-12-31 DIAGNOSIS — J45901 Unspecified asthma with (acute) exacerbation: Secondary | ICD-10-CM

## 2011-12-31 LAB — PULMONARY FUNCTION TEST

## 2011-12-31 NOTE — Telephone Encounter (Signed)
Spirometry does not show airway obstruction of asthma. No reversibility with bronchodilator No change in treatment

## 2011-12-31 NOTE — Progress Notes (Signed)
Spirometry before and after done today. 

## 2012-01-04 ENCOUNTER — Encounter: Payer: Self-pay | Admitting: Pulmonary Disease

## 2012-01-04 NOTE — Telephone Encounter (Signed)
Left message with family member to call back 

## 2012-01-17 NOTE — Telephone Encounter (Signed)
lmomtcb  

## 2012-01-25 NOTE — Telephone Encounter (Signed)
lmomtcb x 2  

## 2012-01-28 NOTE — Telephone Encounter (Signed)
Pt aware. Shelby Anderson, CMA  

## 2012-01-31 ENCOUNTER — Ambulatory Visit (INDEPENDENT_AMBULATORY_CARE_PROVIDER_SITE_OTHER): Payer: Self-pay | Admitting: Pulmonary Disease

## 2012-01-31 VITALS — BP 140/88 | HR 91 | Temp 98.5°F | Ht 66.0 in | Wt 229.6 lb

## 2012-01-31 DIAGNOSIS — J45901 Unspecified asthma with (acute) exacerbation: Secondary | ICD-10-CM

## 2012-01-31 NOTE — Patient Instructions (Addendum)
Stay on dulera & singulair Abuterol MDI  - not more than 2 puffs every 6h as needed Call if you think you are having an asthma flare- we wold like to examine you rather than just take prednisone Discuss face swelling with Dr Elta Guadeloupe may need a CT scan of your lungs for scar tissue at the bases in the future

## 2012-01-31 NOTE — Progress Notes (Signed)
  Subjective:    Patient ID: NEILA TEEM, female    DOB: May 06, 1956, 56 y.o.   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 She had 2 flares in October & again dec '12 responding quickly to prednisone tapers. She had some difficulty getting advair, now on dulera via pt assistance. On 1/3 appt with Dr Clelia Croft, another 15 day taper was given for chest tightness.  She has been evaluated by DrTurner for CAD & had a loop recorder evaluation at [facility] for syncope. EGD in 4/12 (Buccini) has shown peptic ulcer disease.  Her husband smokes but she denies any change in her environment. She has positive PPD.  She denies seasonal allergies, post nasal drip or breakthrough heartburn >>> Moderate persistent with flares since oct'12 ? Etiology  Step up therapy by adding singulair 1 tab at bedtime  Stay on nexium for reflux   Spirometry did not show airway obstruction of asthma.  No reversibility with bronchodilator CXR dec'12 showed RLL scarring  01/31/2012  Pt c/o increased SOB, chest tightness worse when laying down , productive cough with brown/yellow x 6 days - no wheezing  Hemoptysis c/o coughing up bright red blood yesterday only once  Facial Swelling right side of face swelling starting yesterday with pain Tearful during interview due to general medical condition & inability to afford meds & doctor fees  Review of Systems Patient denies significant palpitations, pedal edema, orthopnea, paroxysmal nocturnal dyspnea, lightheadedness, nausea, vomiting, abdominal or  leg pains      Objective:   Physical Exam  Gen. Pleasant, obese, in no distress ENT - no lesions, no post nasal drip Neck: No JVD, no thyromegaly, no carotid bruits or tenderness Lungs: no use of accessory muscles, no dullness to percussion, decreased without rales or  rhonchi  Cardiovascular: Rhythm regular, heart sounds  normal, no murmurs or gallops, no peripheral edema Musculoskeletal: No deformities, no cyanosis or clubbing , no tremors       Assessment & Plan:

## 2012-01-31 NOTE — Assessment & Plan Note (Signed)
Moderate persistent with flares since oct'12 ? Etiology Her symptoms seem to be out of proportion to degree of airway obstruction. Ideally she will need a methacholine challenge to define whether this is true asthma, may also need HRCT chest to see whether basal scarring on cxr represents pulmonary fibrosis (doubt) - due to cost issues, will defer these diagnsotic tests for now. She will ct on dulera & singulair I am not sure how to interpret the hemoptysis - if this continues , she will call & an antibiotic may be needed She will meet with dr Andrey Campanile in a few days & if swelling is still noted on the left side of face, further diagnostic testing may be needed. Her depression needs to be addressed also.

## 2012-02-21 ENCOUNTER — Ambulatory Visit (INDEPENDENT_AMBULATORY_CARE_PROVIDER_SITE_OTHER)
Admission: RE | Admit: 2012-02-21 | Discharge: 2012-02-21 | Disposition: A | Discharge status: Home / Self Care | Payer: Self-pay | Source: Ambulatory Visit | Attending: Pulmonary Disease | Admitting: Pulmonary Disease

## 2012-02-21 ENCOUNTER — Ambulatory Visit (INDEPENDENT_AMBULATORY_CARE_PROVIDER_SITE_OTHER): Payer: Self-pay | Admitting: Pulmonary Disease

## 2012-02-21 ENCOUNTER — Encounter: Payer: Self-pay | Admitting: Pulmonary Disease

## 2012-02-21 ENCOUNTER — Telehealth: Payer: Self-pay | Admitting: Pulmonary Disease

## 2012-02-21 VITALS — BP 120/92 | HR 91 | Temp 98.8°F | Ht 67.0 in | Wt 224.2 lb

## 2012-02-21 DIAGNOSIS — J209 Acute bronchitis, unspecified: Secondary | ICD-10-CM

## 2012-02-21 DIAGNOSIS — J189 Pneumonia, unspecified organism: Secondary | ICD-10-CM

## 2012-02-21 MED ORDER — PREDNISONE 10 MG PO TABS: ORAL_TABLET | ORAL | Status: DC

## 2012-02-21 MED ORDER — LEVOFLOXACIN 750 MG PO TABS: 750.0000 mg | ORAL_TABLET | Freq: Every day | ORAL | Status: DC

## 2012-02-21 NOTE — Patient Instructions (Addendum)
Will check cxr today to make sure this is not pneumonia Will treat with a course of levaquin 750mg  one a day for 5 days.  If the cxr shows pneumonia, will increase to 10days. Will treat with a course of prednisone to help with airway inflammation Would like to have you followup with Dr. Vassie Loll in 1-2 weeks, and also to discuss further workup of your pulmonary issues.

## 2012-02-21 NOTE — Telephone Encounter (Signed)
Pt's spouse says the pt is having a flare-up with her asthma and Dr. Vassie Loll said that she needs to come in for an OV when this happens. Pt will see Dr. Shelle Iron today at 11:15am for acute visit. Pt is to seek emergency help if her sxs get worse before appt time.

## 2012-02-21 NOTE — Assessment & Plan Note (Signed)
The patient's history is most consistent with acute bronchitis, however I cannot exclude the possibility of pneumonia.  I will check a chest x-ray to make sure.  She will need treatment with antibiotics to get her through this period.  As far as possible asthma, the patient is having diffuse wheezing, but my suspicion is this is coming primarily from her upper airway and being transmitted down.  However, she is not able to do spirometry today because of incessant coughing.  Will go ahead and treat her with a short course of prednisone in the event she does have bronchospasm, but I have stressed to her the importance of following up with her primary pulmonologist to help sort through all of this.

## 2012-02-21 NOTE — Progress Notes (Signed)
  Subjective:    Patient ID: Shelby Anderson, female    DOB: 31-Oct-1956, 56 y.o.   MRN: [id number]  HPI Patient comes in today for an acute sick visit.  She has questionable asthma, and is continuing to have pulmonary issues despite being on an aggressive bronchodilator regimen.  She is normally followed by Dr. Vassie Loll.  She gives a 48-hour history of increasing cough with chest congestion, purulent mucus, as well as a fever up to 101.  She has also had increasing shortness of breath as well as audible wheezing.     Review of Systems  Constitutional: Positive for fever. Negative for unexpected weight change.  HENT: Positive for congestion and sinus pressure. Negative for ear pain, nosebleeds, sore throat, rhinorrhea, sneezing, trouble swallowing, dental problem and postnasal drip.   Eyes: Negative for redness and itching.  Respiratory: Positive for chest tightness, shortness of breath and wheezing. Negative for cough.   Cardiovascular: Positive for palpitations. Negative for leg swelling.  Gastrointestinal: Positive for nausea.  Genitourinary: Negative for dysuria.  Musculoskeletal: Negative for joint swelling.  Skin: Negative for rash.  Neurological: Positive for headaches.  Hematological: Does not bruise/bleed easily.  Psychiatric/Behavioral: Positive for dysphoric mood. The patient is nervous/anxious.        Objective:   Physical Exam Obese female in no acute distress.  Congested cough noted Nose without purulence or discharge, oropharynx clear Chest with diffuse wheezes and rhonchi, but adequate air flow.  It is unclear how much the wheezing is coming from the upper airway versus the lower airway.  Patient coughing continuously during my exam. Cardiac exam with regular rate and rhythm Lower extremities without significant edema, no cyanosis noted Alert and oriented, moves all 4 extremities.       Assessment & Plan:

## 2012-02-27 ENCOUNTER — Telehealth: Payer: Self-pay | Admitting: Pulmonary Disease

## 2012-02-27 NOTE — Telephone Encounter (Signed)
Will extend the levaquin 750mg  one a day for additional 5 days.  If she doesn't improve, needs acute visit with alva

## 2012-02-27 NOTE — Telephone Encounter (Signed)
LMTCB in the morning about this message as pt was out of the home at the time we called.

## 2012-02-27 NOTE — Telephone Encounter (Signed)
I spoke with pt regarding her cxr results. Pt states she was feeling better but then she had finished the pred taper and abx and now she is coughing up green-yellow phlem, still SOB, chest tightness, wheezing, chest congestion,  And having some nasal congestion. Please advise KC, thanks  Allergies  Allergen Reactions  . Oxycontin Anaphylaxis    hives, trouble breathing, tongue swelling (can tolerate oxycodone IR)   . Aspirin Other (See Comments)    GI Bleeds  . Darvocet (Propoxacet-N) Hives  . Lodine (Etodolac) Hives and Swelling  . Nitroglycerin     IV-BP drops  . Penicillins Other (See Comments)    unknown  . Ultram (Tramadol Hcl) Hives  . Valium Other (See Comments)    Circulation problems

## 2012-02-28 MED ORDER — LEVOFLOXACIN 750 MG PO TABS: 750.0000 mg | ORAL_TABLET | Freq: Every day | ORAL | Status: AC

## 2012-02-28 NOTE — Telephone Encounter (Signed)
I spoke with pt and is aware of rx directions and this has been sent to the pharmacy. She voiced her understanding and will call back for an OV if she is not getting any better

## 2012-02-28 NOTE — Telephone Encounter (Signed)
Returning call from yesterday. °

## 2012-02-28 NOTE — Telephone Encounter (Signed)
Pt returning call can be reached at [phone].Shelby Anderson

## 2012-02-28 NOTE — Telephone Encounter (Signed)
LMOMTCB x 1. Pt needs to be notified of Levaquin RX for additional 5 days per Dr. Shelle Iron.. This has been sent to [facility].

## 2012-03-10 ENCOUNTER — Ambulatory Visit (INDEPENDENT_AMBULATORY_CARE_PROVIDER_SITE_OTHER): Payer: Self-pay | Admitting: Pulmonary Disease

## 2012-03-10 ENCOUNTER — Encounter: Payer: Self-pay | Admitting: Pulmonary Disease

## 2012-03-10 VITALS — BP 112/60 | HR 62 | Temp 98.3°F | Ht 66.0 in | Wt 232.4 lb

## 2012-03-10 DIAGNOSIS — J45901 Unspecified asthma with (acute) exacerbation: Secondary | ICD-10-CM

## 2012-03-10 NOTE — Patient Instructions (Signed)
Stay on dulera twice daily Call us for flare

## 2012-03-10 NOTE — Progress Notes (Signed)
  Subjective:    Patient ID: Shelby Anderson, female    DOB: May 25, 1956, 56 y.o.   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 She had 2 flares in October & again dec '12 responding quickly to prednisone tapers. She had some difficulty getting advair, now on dulera via pt assistance. On 1/3 appt with Dr Clelia Croft, another 15 day taper was given for chest tightness.  She has been evaluated by DrTurner for CAD & had a loop recorder evaluation at [facility] for syncope. EGD in 4/12 (Buccini) has shown peptic ulcer disease.  Spirometry did not show airway obstruction of asthma -No reversibility with bronchodilator   Her husband smokes but she denies any change in her environment. She has positive PPD.  She denies seasonal allergies, post nasal drip or breakthrough heartburn    01/31/2012  Pt c/o increased SOB, chest tightness worse when laying down , productive cough with brown/yellow x 6 days - no wheezing  Hemoptysis c/o coughing up bright red bloodx1 Facial Swelling right side of face swelling starting yesterday with pain  Tearful during interview due to general medical condition & inability to afford meds & doctor fees  03/10/2012  3/27 >>acute OV,  treated with levaquin for acute bronchitis & pred taper CXR LLL nodular infx but not seen on lateral view Pt states her breathing is better. Still coughing up yellow w/ tinit of green, little chest tx w/ exertion. denies any wheezing, f/c/s/ She has been taken out of work by cards & pain mx  Review of Systems Patient denies significant dyspnea,cough, hemoptysis,  chest pain, palpitations, pedal edema, orthopnea, paroxysmal nocturnal dyspnea, lightheadedness, nausea, vomiting, abdominal or  leg pains      Objective:   Physical Exam  Gen. Pleasant, obese, in no distress ENT - no lesions,  no post nasal drip Neck: No JVD, no thyromegaly, no carotid bruits Lungs: no use of accessory muscles, no dullness to percussion, decreased without rales or rhonchi  Cardiovascular: Rhythm regular, heart sounds  normal, no murmurs or gallops, no peripheral edema Musculoskeletal: No deformities, no cyanosis or clubbing , no tremors       Assessment & Plan:

## 2012-03-12 NOTE — Assessment & Plan Note (Signed)
Moderate persistent with flares since oct'12 ? Etiology Her symptoms seem to be out of proportion to degree of airway obstruction. Ideally she will need a methacholine challenge to define whether this is true asthma, may also need HRCT chest to see whether basal scarring on cxr represents pulmonary fibrosis (doubt) - will defer these diagnostic tests for now. She will ct on dulera & singulair

## 2012-04-08 ENCOUNTER — Other Ambulatory Visit: Payer: Self-pay | Admitting: Pulmonary Disease

## 2012-04-08 ENCOUNTER — Ambulatory Visit: Payer: Self-pay

## 2012-04-10 ENCOUNTER — Ambulatory Visit: Payer: Self-pay | Attending: Family Medicine | Admitting: Physical Therapy

## 2012-04-10 DIAGNOSIS — IMO0001 Reserved for inherently not codable concepts without codable children: Secondary | ICD-10-CM | POA: Insufficient documentation

## 2012-04-10 DIAGNOSIS — M25519 Pain in unspecified shoulder: Secondary | ICD-10-CM | POA: Insufficient documentation

## 2012-04-10 DIAGNOSIS — M25619 Stiffness of unspecified shoulder, not elsewhere classified: Secondary | ICD-10-CM | POA: Insufficient documentation

## 2012-04-10 DIAGNOSIS — R293 Abnormal posture: Secondary | ICD-10-CM | POA: Insufficient documentation

## 2012-04-15 ENCOUNTER — Ambulatory Visit: Payer: Self-pay | Admitting: Physical Therapy

## 2012-04-22 ENCOUNTER — Encounter: Payer: Self-pay | Admitting: Physical Therapy

## 2012-04-24 ENCOUNTER — Ambulatory Visit: Payer: Self-pay | Admitting: Physical Therapy

## 2012-04-29 ENCOUNTER — Encounter: Payer: Self-pay | Admitting: Adult Health

## 2012-04-29 ENCOUNTER — Ambulatory Visit: Payer: Self-pay | Admitting: Physical Therapy

## 2012-04-29 ENCOUNTER — Ambulatory Visit (INDEPENDENT_AMBULATORY_CARE_PROVIDER_SITE_OTHER): Payer: Self-pay | Admitting: Adult Health

## 2012-04-29 ENCOUNTER — Ambulatory Visit (INDEPENDENT_AMBULATORY_CARE_PROVIDER_SITE_OTHER)
Admission: RE | Admit: 2012-04-29 | Discharge: 2012-04-29 | Disposition: A | Discharge status: Home / Self Care | Payer: Self-pay | Source: Ambulatory Visit | Attending: Adult Health | Admitting: Adult Health

## 2012-04-29 DIAGNOSIS — J45909 Unspecified asthma, uncomplicated: Secondary | ICD-10-CM

## 2012-04-29 NOTE — Progress Notes (Signed)
Addended by: Boone Master E on: 04/29/2012 03:11 PM   Modules accepted: Orders

## 2012-04-29 NOTE — Progress Notes (Signed)
Subjective:    Patient ID: Shelby Anderson, female    DOB: August 26, 1956, 56 y.o.   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 She had 2 flares in October & again dec '12 responding quickly to prednisone tapers. She had some difficulty getting advair, now on dulera via pt assistance. On 1/3 appt with Dr Clelia Croft, another 15 day taper was given for chest tightness.  She has been evaluated by DrTurner for CAD & had a loop recorder evaluation at [facility] for syncope. EGD in 4/12 (Buccini) has shown peptic ulcer disease.  Spirometry did not show airway obstruction of asthma -No reversibility with bronchodilator   Her husband smokes but she denies any change in her environment. She has positive PPD.  She denies seasonal allergies, post nasal drip or breakthrough heartburn    01/31/2012  Pt c/o increased SOB, chest tightness worse when laying down , productive cough with brown/yellow x 6 days - no wheezing  Hemoptysis c/o coughing up bright red bloodx1 Facial Swelling right side of face swelling starting yesterday with pain  Tearful during interview due to general medical condition & inability to afford meds & doctor fees  03/10/12  3/27 >>acute OV,  treated with levaquin for acute bronchitis & pred taper CXR LLL nodular infx but not seen on lateral view Pt states her breathing is better. Still coughing up yellow w/ tinit of green, little chest tx w/ exertion. denies any wheezing, f/c/s/ She has been taken out of work by cards & pain mx >>no changes   04/29/2012 Follow up  Returns for 3 month follow up of Asthma .  Doing well on current regimen.  Continues on [place] Twice daily  .  No increased SABA use. No ER or hospital stays since last ov .  Feels better on singulair.   No chest pain or hemoptysis   Review of  Systems  Constitutional:   No  weight loss, night sweats,  Fevers, chills,  +fatigue, or  lassitude.  HEENT:   No headaches,  Difficulty swallowing,  Tooth/dental problems, or  Sore throat,                No sneezing, itching, ear ache, nasal congestion, post nasal drip,   CV:  No chest pain,  Orthopnea, PND, swelling in lower extremities, anasarca, dizziness, palpitations, syncope.   GI  No heartburn, indigestion, abdominal pain, nausea, vomiting, diarrhea, change in bowel habits, loss of appetite, bloody stools.   Resp:    No excess mucus, no productive cough,  No non-productive cough,  No coughing up of blood.  No change in color of mucus.  No wheezing.  No chest wall deformity  Skin: no rash or lesions.  GU: no dysuria, change in color of urine, no urgency or frequency.  No flank pain, no hematuria   MS:  No joint pain or swelling.  No decreased range of motion.  No back pain.  Psych:  No change in mood or affect. No depression or anxiety.  No memory loss.         Objective:   Physical Exam GEN: A/Ox3; pleasant , NAD, well nourished   HEENT:  [place]/AT,  EACs-clear, TMs-wnl, NOSE-clear, THROAT-clear, no lesions, no postnasal drip or exudate noted.   NECK:  Supple w/ fair ROM; no JVD; normal carotid  impulses w/o bruits; no thyromegaly or nodules palpated; no lymphadenopathy.  RESP  Clear  P & A; w/o, wheezes/ rales/ or rhonchi.no accessory muscle use, no dullness to percussion  CARD:  RRR, no m/r/g  , no peripheral edema, pulses intact, no cyanosis or clubbing.  GI:   Soft & nt; nml bowel sounds; no organomegaly or masses detected.  Musco: Warm bil, no deformities or joint swelling noted.   Neuro: alert, no focal deficits noted.    Skin: Warm, no lesions or rashes        Assessment & Plan:

## 2012-04-29 NOTE — Patient Instructions (Signed)
Continue on current regimen.  follow up Dr. Vassie Loll  In 3 months and As needed   I will call with xray results.  Please contact office for sooner follow up if symptoms do not improve or worsen or seek emergency care

## 2012-04-29 NOTE — Assessment & Plan Note (Signed)
Compensated on present regimen.  Last flare with ? PNA on cxr - will repeat today to verify clearance on xray  Plan:  Cont on current regimen.

## 2012-05-01 ENCOUNTER — Ambulatory Visit: Payer: Self-pay | Admitting: Physical Therapy

## 2012-05-20 ENCOUNTER — Ambulatory Visit: Payer: Self-pay | Attending: Family Medicine | Admitting: Physical Therapy

## 2012-05-20 DIAGNOSIS — IMO0001 Reserved for inherently not codable concepts without codable children: Secondary | ICD-10-CM | POA: Insufficient documentation

## 2012-05-20 DIAGNOSIS — M25619 Stiffness of unspecified shoulder, not elsewhere classified: Secondary | ICD-10-CM | POA: Insufficient documentation

## 2012-05-20 DIAGNOSIS — R293 Abnormal posture: Secondary | ICD-10-CM | POA: Insufficient documentation

## 2012-05-20 DIAGNOSIS — M25519 Pain in unspecified shoulder: Secondary | ICD-10-CM | POA: Insufficient documentation

## 2012-05-21 ENCOUNTER — Ambulatory Visit: Payer: Self-pay | Admitting: Physical Therapy

## 2012-05-26 ENCOUNTER — Ambulatory Visit: Payer: Self-pay | Admitting: Physical Therapy

## 2012-05-28 ENCOUNTER — Ambulatory Visit: Payer: Self-pay | Admitting: Physical Therapy

## 2012-06-10 ENCOUNTER — Ambulatory Visit: Payer: Self-pay | Attending: Family Medicine | Admitting: Physical Therapy

## 2012-06-10 DIAGNOSIS — M25519 Pain in unspecified shoulder: Secondary | ICD-10-CM | POA: Insufficient documentation

## 2012-06-10 DIAGNOSIS — R293 Abnormal posture: Secondary | ICD-10-CM | POA: Insufficient documentation

## 2012-06-10 DIAGNOSIS — IMO0001 Reserved for inherently not codable concepts without codable children: Secondary | ICD-10-CM | POA: Insufficient documentation

## 2012-06-10 DIAGNOSIS — M25619 Stiffness of unspecified shoulder, not elsewhere classified: Secondary | ICD-10-CM | POA: Insufficient documentation

## 2012-06-12 ENCOUNTER — Ambulatory Visit: Payer: Self-pay | Admitting: Physical Therapy

## 2012-06-18 ENCOUNTER — Ambulatory Visit: Payer: Self-pay | Admitting: Rehabilitation

## 2012-06-20 ENCOUNTER — Ambulatory Visit: Payer: Self-pay | Admitting: Rehabilitation

## 2012-06-25 ENCOUNTER — Ambulatory Visit: Payer: Self-pay | Admitting: Rehabilitation

## 2012-06-26 ENCOUNTER — Ambulatory Visit: Payer: Self-pay | Admitting: Physical Therapy

## 2012-07-03 ENCOUNTER — Other Ambulatory Visit: Payer: Self-pay | Admitting: Pulmonary Disease

## 2012-07-31 ENCOUNTER — Ambulatory Visit (INDEPENDENT_AMBULATORY_CARE_PROVIDER_SITE_OTHER): Payer: Self-pay | Admitting: Pulmonary Disease

## 2012-07-31 ENCOUNTER — Encounter: Payer: Self-pay | Admitting: Pulmonary Disease

## 2012-07-31 VITALS — BP 110/70 | HR 62 | Temp 98.2°F | Ht 66.0 in | Wt 229.8 lb

## 2012-07-31 DIAGNOSIS — G4733 Obstructive sleep apnea (adult) (pediatric): Secondary | ICD-10-CM | POA: Insufficient documentation

## 2012-07-31 DIAGNOSIS — J45901 Unspecified asthma with (acute) exacerbation: Secondary | ICD-10-CM

## 2012-07-31 NOTE — Assessment & Plan Note (Signed)
Moderate persistent with flares since oct'12 ? Etiology Seems to have finally settled down on current regimen of dulera/ SABA - which she is getting via assistance

## 2012-07-31 NOTE — Progress Notes (Signed)
  Subjective:    Patient ID: Shelby Anderson, female    DOB: 12-16-1955, 56 y.o.   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 She had 2 flares in October & again dec '12 , jan' 13 responding quickly to prednisone tapers. She had  difficulty getting advair, now on dulera via pt assistance. She has been evaluated by DrTurner for CAD & had a loop recorder evaluation at [facility] for syncope. EGD in 4/12 (Buccini) has shown peptic ulcer disease.  Spirometry did not show airway obstruction of asthma -No reversibility with bronchodilator  Her husband smokes but she denies any change in her environment. She has positive PPD.  She denies seasonal allergies, post nasal drip or breakthrough heartburn on nexium 01/31/2012  Pt c/o increased SOB, chest tightness worse when laying down , productive cough with brown/yellow x 6 days - no wheezing  Hemoptysis c/o coughing up bright red bloodx1  Facial Swelling right side of face swelling starting yesterday with pain  Tearful during interview due to general medical condition & inability to afford meds & doctor fees    07/31/2012 28m FU  Doing well on current regimen.  Continues on [place] Twice daily .  No increased SABA use. No ER or hospital stays since last ov .  Feels better on singulair.  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 Also denies presyncope, palpitations, heartburn, abdominal pain, nausea, vomiting, diarrhea or change in bowel or urinary habits, dysuria,hematuria, rash, arthralgias, visual complaints, headache, numbness weakness or ataxia.     Objective:   Physical Exam  Gen. Pleasant, obese, in no distress ENT - no lesions, no post nasal drip Neck: No JVD, no thyromegaly, no carotid bruits Lungs: no use of accessory muscles, no dullness to percussion, decreased without rales or rhonchi  Cardiovascular: Rhythm regular, heart sounds  normal, no murmurs or gallops, no peripheral edema Musculoskeletal: No deformities, no cyanosis or clubbing , no tremors         Assessment & Plan:

## 2012-07-31 NOTE — Patient Instructions (Signed)
Home sleep test Stay on dulera Use rescue inhaler as needed -call us if increased use

## 2012-07-31 NOTE — Assessment & Plan Note (Signed)
Given excessive daytime somnolence, narrow pharyngeal exam, witnessed apneas & loud snoring, obstructive sleep apnea is very likely & an overnight polysomnogram will be scheduled as a home study. The pathophysiology of obstructive sleep apnea , it's cardiovascular consequences & modes of treatment including CPAP were discused with the patient in detail & they evidenced understanding.  

## 2012-08-07 ENCOUNTER — Telehealth: Payer: Self-pay | Admitting: Pulmonary Disease

## 2012-08-07 ENCOUNTER — Ambulatory Visit (INDEPENDENT_AMBULATORY_CARE_PROVIDER_SITE_OTHER): Payer: Self-pay | Admitting: Pulmonary Disease

## 2012-08-07 DIAGNOSIS — G4733 Obstructive sleep apnea (adult) (pediatric): Secondary | ICD-10-CM

## 2012-08-07 NOTE — Telephone Encounter (Signed)
lmtcb x1 

## 2012-08-07 NOTE — Telephone Encounter (Signed)
I spoke with pt and is aware of results. She stated she will think about being set up on oxygen at night and will call us back

## 2012-08-07 NOTE — Telephone Encounter (Signed)
Home Sleep study showed no significant obstructive sleep apnea . Her oxygen levels did drop during sleep -she will qualify for oxygen during sleep on this basis.

## 2012-10-31 ENCOUNTER — Ambulatory Visit: Payer: Self-pay | Admitting: Adult Health

## 2012-11-03 ENCOUNTER — Ambulatory Visit: Payer: Self-pay | Admitting: Adult Health

## 2012-11-05 ENCOUNTER — Ambulatory Visit (INDEPENDENT_AMBULATORY_CARE_PROVIDER_SITE_OTHER): Payer: Self-pay | Admitting: Adult Health

## 2012-11-05 ENCOUNTER — Encounter: Payer: Self-pay | Admitting: Adult Health

## 2012-11-05 VITALS — BP 112/82 | HR 88 | Temp 98.6°F | Ht 66.0 in | Wt 231.4 lb

## 2012-11-05 DIAGNOSIS — J45901 Unspecified asthma with (acute) exacerbation: Secondary | ICD-10-CM

## 2012-11-05 MED ORDER — PROMETHAZINE-CODEINE 6.25-10 MG/5ML PO SYRP: 5.0000 mL | ORAL_SOLUTION | Freq: Four times a day (QID) | ORAL | Status: DC | PRN

## 2012-11-05 MED ORDER — PREDNISONE 10 MG PO TABS: ORAL_TABLET | ORAL | Status: DC

## 2012-11-05 MED ORDER — LEVALBUTEROL HCL 0.63 MG/3ML IN NEBU
0.6300 mg | INHALATION_SOLUTION | Freq: Once | RESPIRATORY_TRACT | Status: AC
  Administered 2012-11-05: 0.63 mg via RESPIRATORY_TRACT

## 2012-11-05 NOTE — Patient Instructions (Addendum)
Factive 320mg  daily for 5 days - samples given  Mucinex DM Twice daily  As needed  Cough/congestion  Prednisone taper over next week.  Fluids and rest  Saline nasal rinse As needed   Phenergan w/ codeine cough syrup As needed  Cough  Please contact office for sooner follow up if symptoms do not improve or worsen or seek emergency care  follow up Dr. Vassie Loll  In 2-3  months

## 2012-11-05 NOTE — Assessment & Plan Note (Signed)
Flare  xopenex neb in office   Plan  Factive 320mg  daily for 5 days - samples given  Mucinex DM Twice daily  As needed  Cough/congestion  Prednisone taper over next week.  Fluids and rest  Saline nasal rinse As needed   Phenergan w/ codeine cough syrup As needed  Cough  Please contact office for sooner follow up if symptoms do not improve or worsen or seek emergency care  follow up Dr. Vassie Loll  In 2-3  months

## 2012-11-05 NOTE — Progress Notes (Signed)
Subjective:    Patient ID: Shelby Anderson, female    DOB: [birth year], 56 y.o.   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 She had 2 flares in October & again dec '12 , jan' 13 responding quickly to prednisone tapers. She had  difficulty getting advair, now on dulera via pt assistance. She has been evaluated by DrTurner for CAD & had a loop recorder evaluation at [facility] for syncope. EGD in 4/12 (Buccini) has shown peptic ulcer disease.  Spirometry did not show airway obstruction of asthma -No reversibility with bronchodilator  Her husband smokes but she denies any change in her environment. She has positive PPD.  She denies seasonal allergies, post nasal drip or breakthrough heartburn on nexium 01/31/2012  Pt c/o increased SOB, chest tightness worse when laying down , productive cough with brown/yellow x 6 days - no wheezing  Hemoptysis c/o coughing up bright red bloodx1  Facial Swelling right side of face swelling starting yesterday with pain  Tearful during interview due to general medical condition & inability to afford meds & doctor fees    07/31/12  76m FU Doing well on current regimen.  Continues on [place] Twice daily .  No increased SABA use. No ER or hospital stays since last ov .  Feels better on singulair.  02/27/12  >>acute OV, treated with levaquin for acute bronchitis & pred taper  CXR LLL nodular infx  5/13 CXR -resolved lingular ASD  Rescue inhaler 5/wk incl nocturnal 3p orthopnea No chest pain or hemoptysis  Does report excessive daytime somnolence , snoring, no gasping episodes or witnessed apneas >no changes   11/05/2012 Acute OV  Complains of  hoarseness, chills, prod cough with brownish-clear mucus, head congestion, PND, sore throat, wheezing, tightness in chest x6days Hopes to have insurance at  first of year with the affordable care act.  Does not have rx coverage at present. Gets Dulera thru rx assistance program.  No otc used.  She denies any fever or hemoptysis, orthopnea, PND, or leg swelling  Review of Systems  Constitutional:   No  weight loss, night sweats,  Fevers, chills,  +fatigue, or  lassitude.  HEENT:   No headaches,  Difficulty swallowing,  Tooth/dental problems, or  Sore throat,                No sneezing, itching, ear ache,  +nasal congestion, post nasal drip,   CV:  No chest pain,  Orthopnea, PND, swelling in lower extremities, anasarca, dizziness, palpitations, syncope.   GI  No heartburn, indigestion, abdominal pain, nausea, vomiting, diarrhea, change in bowel habits, loss of appetite, bloody stools.   Resp:   No coughing up of blood.    No chest wall deformity  Skin: no rash or lesions.  GU: no dysuria, change in color of urine, no urgency or frequency.  No flank pain, no hematuria   MS:  No joint pain or swelling.  No decreased range of motion.  No back pain.  Psych:  No change in mood or affect. No depression or anxiety.  No memory loss.          Objective:   Physical Exam   Gen. Pleasant, obese, in no distress ENT - no lesions, clear nasal drainage  Neck: No JVD, no thyromegaly, no carotid bruits Lungs: no  use of accessory muscles, no dullness to percussion, coarse rhonchi with few exp wheezes  Cardiovascular: Rhythm regular, heart sounds  normal, no murmurs or gallops, no peripheral edema Musculoskeletal: No deformities, no cyanosis or clubbing , no tremors         Assessment & Plan:

## 2013-01-05 ENCOUNTER — Ambulatory Visit (INDEPENDENT_AMBULATORY_CARE_PROVIDER_SITE_OTHER)
Admission: RE | Admit: 2013-01-05 | Discharge: 2013-01-05 | Disposition: A | Discharge status: Home / Self Care | Payer: BC Managed Care – PPO | Source: Ambulatory Visit | Attending: Pulmonary Disease | Admitting: Pulmonary Disease

## 2013-01-05 ENCOUNTER — Other Ambulatory Visit (INDEPENDENT_AMBULATORY_CARE_PROVIDER_SITE_OTHER): Payer: BC Managed Care – PPO

## 2013-01-05 ENCOUNTER — Encounter: Payer: Self-pay | Admitting: Pulmonary Disease

## 2013-01-05 ENCOUNTER — Ambulatory Visit (INDEPENDENT_AMBULATORY_CARE_PROVIDER_SITE_OTHER): Payer: BC Managed Care – PPO | Admitting: Pulmonary Disease

## 2013-01-05 VITALS — BP 126/72 | HR 73 | Temp 100.8°F | Ht 66.0 in | Wt 226.8 lb

## 2013-01-05 DIAGNOSIS — R509 Fever, unspecified: Secondary | ICD-10-CM

## 2013-01-05 DIAGNOSIS — J45901 Unspecified asthma with (acute) exacerbation: Secondary | ICD-10-CM

## 2013-01-05 LAB — URINALYSIS, ROUTINE W REFLEX MICROSCOPIC
Hgb urine dipstick: NEGATIVE
Leukocytes, UA: NEGATIVE
Nitrite: NEGATIVE
Urobilinogen, UA: 1 (ref 0.0–1.0)

## 2013-01-05 LAB — CBC WITH DIFFERENTIAL/PLATELET
Basophils Relative: 0.5 % (ref 0.0–3.0)
Eosinophils Absolute: 0.7 10*3/uL (ref 0.0–0.7)
HCT: 39.1 % (ref 36.0–46.0)
Hemoglobin: 12.8 g/dL (ref 12.0–15.0)
Lymphocytes Relative: 15.4 % (ref 12.0–46.0)
Lymphs Abs: 1.4 10*3/uL (ref 0.7–4.0)
MCHC: 32.8 g/dL (ref 30.0–36.0)
MCV: 85.2 fl (ref 78.0–100.0)
Monocytes Absolute: 0.6 10*3/uL (ref 0.1–1.0)
Neutro Abs: 6.6 10*3/uL (ref 1.4–7.7)
RBC: 4.59 Mil/uL (ref 3.87–5.11)

## 2013-01-05 MED ORDER — AZITHROMYCIN 250 MG PO TABS: ORAL_TABLET | ORAL | Status: DC

## 2013-01-05 MED ORDER — PROMETHAZINE-CODEINE 6.25-10 MG/5ML PO SYRP: 5.0000 mL | ORAL_SOLUTION | Freq: Every day | ORAL | Status: DC

## 2013-01-05 MED ORDER — PREDNISONE 10 MG PO TABS: ORAL_TABLET | ORAL | Status: DC

## 2013-01-05 NOTE — Patient Instructions (Addendum)
Blood work, CXR, UA today Antibiotic Prednisone 10 mg tabs  Take 2 tabs daily with food x 5ds, then 1 tab daily with food x 5ds then STOP Stay on dulera & singulair Promethazine-codeine cough syrup 5ml PO at bedtime x 15 ds

## 2013-01-05 NOTE — Progress Notes (Signed)
  Subjective:    Patient ID: Shelby Anderson, female    DOB: [birth year], 57 y.o.   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 She had 2 flares in October & again dec '12 , jan' 13 responding quickly to prednisone tapers. She had  difficulty getting advair, now on dulera via pt assistance. She has been evaluated by Dr Mayford Knife for CAD & had a loop recorder evaluation at [facility] for syncope. EGD in 4/12 (Buccini) has shown peptic ulcer disease.  Spirometry did not show airway obstruction of asthma -No reversibility with bronchodilator  Her husband smokes but she denies any change in her environment. She has positive PPD.  She denies seasonal allergies, post nasal drip or breakthrough heartburn on nexium 01/31/2012  Pt c/o increased SOB, chest tightness worse when laying down , productive cough with brown/yellow x 6 days - no wheezing  Hemoptysis c/o coughing up bright red bloodx1  Facial Swelling right side of face swelling starting yesterday with pain  Tearful during interview due to general medical condition & inability to afford meds & doctor fees      02/27/12  >>acute OV, treated with levaquin for acute bronchitis & pred taper  CXR LLL nodular infx - resolved 5/13   01/05/2013  Pt reports increase SOB, dry cough, chest congestion, wheezing, chest tx, temp 100-101.3, sweats at night x last tuesday. Pt states she was good for  3-4 days after the medications TP prescribed for her from 11/05/12 OV.  >> phenargan codeine/ mucinex/ xopenex neb, then started feeling worse again. She is having to use her rescue inhaler 2-3 times a day for past 4 ds Pt is wanting to know if she still needs oxygen--she now has insurance Febrile 100.4 , reports fever x past 4 ds - no colored sputum or burning micturition CXR - no new infx Labs- WC nml, UA  neg  Review of Systems neg for any significant sore throat, dysphagia, itching, sneezing, nasal congestion or excess/ purulent secretions, chills, sweats, unintended wt loss, pleuritic or exertional cp, hempoptysis, orthopnea pnd or change in chronic leg swelling. Also denies presyncope, palpitations, heartburn, abdominal pain, nausea, vomiting, diarrhea or change in bowel or urinary habits, dysuria,hematuria, rash, arthralgias, visual complaints, headache, numbness weakness or ataxia.     Objective:   Physical Exam   Gen. Pleasant, obese, in no distress ENT - no lesions, no post nasal drip Neck: No JVD, no thyromegaly, no carotid bruits Lungs: no use of accessory muscles, no dullness to percussion, decreased without rales, faint exp  rhonchi  Cardiovascular: Rhythm regular, heart sounds  normal, no murmurs or gallops, no peripheral edema Musculoskeletal: No deformities, no cyanosis or clubbing , no tremors        Assessment & Plan:

## 2013-01-05 NOTE — Assessment & Plan Note (Signed)
Zpak for ? Acute bronchitis Chk UA for fever Chk rAST -if IgE high, consider xolair - she has needed frequent steroid tapers over the psat 2 yrs. Prednisone 10 mg tabs  Take 2 tabs daily with food x 5ds, then 1 tab daily with food x 5ds then STOP Stay on dulera & singulair Promethazine-codeine cough syrup 5ml PO at bedtime x 15 ds

## 2013-01-06 LAB — ALLERGY FULL PROFILE
Allergen, D pternoyssinus,d7: <0.1 kU/L
Allergen,Goose feathers, e70: <0.1 kU/L
Alternaria Alternata: <0.1 kU/L
Aspergillus fumigatus, m3: <0.1 kU/L
Bahia Grass: <0.1 kU/L
Bermuda Grass: <0.1 kU/L
Box Elder IgE: <0.1 kU/L
Candida Albicans: <0.1 kU/L
Cat Dander: <0.1 kU/L
Common Ragweed: <0.1 kU/L
Curvularia lunata: <0.1 kU/L
D. farinae: <0.1 kU/L
Dog Dander: <0.1 kU/L
Elm IgE: <0.1 kU/L
Fescue: <0.1 kU/L
G005 Rye, Perennial: <0.1 kU/L
G009 Red Top: <0.1 kU/L
Goldenrod: 0.12 kU/L — ABNORMAL HIGH
Helminthosporium halodes: <0.1 kU/L
House Dust Hollister: <0.1 kU/L
IgE (Immunoglobulin E), Serum: 20 IU/mL (ref 0.0–180.0)
Lamb's Quarters: <0.1 kU/L
Oak: <0.1 kU/L
Plantain: <0.1 kU/L
Stemphylium Botryosum: <0.1 kU/L
Sycamore Tree: <0.1 kU/L
Timothy Grass: <0.1 kU/L

## 2013-03-05 ENCOUNTER — Other Ambulatory Visit: Payer: Self-pay | Admitting: Nurse Practitioner

## 2013-03-05 DIAGNOSIS — N6452 Nipple discharge: Secondary | ICD-10-CM

## 2013-03-10 ENCOUNTER — Encounter: Payer: Self-pay | Admitting: Gynecology

## 2013-03-10 ENCOUNTER — Ambulatory Visit (INDEPENDENT_AMBULATORY_CARE_PROVIDER_SITE_OTHER): Payer: BC Managed Care – PPO | Admitting: Gynecology

## 2013-03-10 VITALS — BP 130/88 | Ht 64.0 in | Wt 235.0 lb

## 2013-03-10 DIAGNOSIS — N952 Postmenopausal atrophic vaginitis: Secondary | ICD-10-CM | POA: Insufficient documentation

## 2013-03-10 DIAGNOSIS — N949 Unspecified condition associated with female genital organs and menstrual cycle: Secondary | ICD-10-CM | POA: Insufficient documentation

## 2013-03-10 DIAGNOSIS — N889 Noninflammatory disorder of cervix uteri, unspecified: Secondary | ICD-10-CM

## 2013-03-10 DIAGNOSIS — N950 Postmenopausal bleeding: Secondary | ICD-10-CM

## 2013-03-10 MED ORDER — NONFORMULARY OR COMPOUNDED ITEM: Status: DC

## 2013-03-10 NOTE — Progress Notes (Signed)
Patient is a 57 year old gravida 5 para 3 AB 2 (2 premature deliveries at [redacted] weeks gestation) referred to our practice as a courtesy of Darl Pikes for, DNP,FNP-BC as a result of abnormal finding during her gynecological examination and Pap smear where she thought that patient had a cervical polyp. On further questioning the patient states that over the past year and a half she has had some irregular spotting. She is on no hormone replacement therapy. 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 Patient has informed me that she had 3 cerclage placed with her 3 deliveries that were delivered at term as a result of her 2 previous premature deliveries.  Exam: Bartholin urethra Skene was within normal limits Vagina: No lesions or discharge, vaginal atrophy Cervix: Anterior cervical hood no evidence of cervical polyp cervical os not visualized as a result of stenosis from vaginal atrophy Uterus: Anteverted normal size shape and consistency Adnexa: No palpable masses or tenderness Rectal exam: Not done  Assessment/plan: Patient with anterior cervical hood as a result of 2 previous cerclages. Patient denies being a DES daughter. Patient stated recent Pap smear was normal as previous ones. This peri/postmenopausal bleeding needs further evaluation and since the cervical os was not visualized as a result of the stenosis from vaginal atrophy she will be placed on estradiol 0.02% 1 mL pre-filled applicator to apply intravaginally each bedtime for 2 weeks and then she will begin using a twice a week for vaginal atrophy. She will return to the office in 2 weeks for an ultrasound to determine endometrial thickness and if over 5 mm and cervix can be dilated we will then proceed with an endometrial biopsy. Her sporadic spotting could have been attributed to atrophic bleeding or from her vaginal atrophy.

## 2013-03-10 NOTE — Patient Instructions (Signed)

## 2013-03-17 ENCOUNTER — Other Ambulatory Visit: Payer: Self-pay | Admitting: Gynecology

## 2013-03-17 ENCOUNTER — Other Ambulatory Visit: Payer: Self-pay | Admitting: Physician Assistant

## 2013-03-17 DIAGNOSIS — M545 Low back pain: Secondary | ICD-10-CM

## 2013-03-17 DIAGNOSIS — N83339 Acquired atrophy of ovary and fallopian tube, unspecified side: Secondary | ICD-10-CM

## 2013-03-17 DIAGNOSIS — N950 Postmenopausal bleeding: Secondary | ICD-10-CM

## 2013-03-18 ENCOUNTER — Other Ambulatory Visit: Payer: Self-pay | Admitting: Gynecology

## 2013-03-18 DIAGNOSIS — N950 Postmenopausal bleeding: Secondary | ICD-10-CM

## 2013-03-18 DIAGNOSIS — N83339 Acquired atrophy of ovary and fallopian tube, unspecified side: Secondary | ICD-10-CM

## 2013-03-20 ENCOUNTER — Ambulatory Visit
Admission: RE | Admit: 2013-03-20 | Discharge: 2013-03-20 | Disposition: A | Discharge status: Home / Self Care | Payer: BC Managed Care – PPO | Source: Ambulatory Visit | Attending: Physician Assistant | Admitting: Physician Assistant

## 2013-03-20 DIAGNOSIS — M545 Low back pain, unspecified: Secondary | ICD-10-CM

## 2013-03-25 ENCOUNTER — Other Ambulatory Visit: Payer: BC Managed Care – PPO

## 2013-03-25 ENCOUNTER — Ambulatory Visit (INDEPENDENT_AMBULATORY_CARE_PROVIDER_SITE_OTHER): Payer: BC Managed Care – PPO

## 2013-03-25 ENCOUNTER — Ambulatory Visit (INDEPENDENT_AMBULATORY_CARE_PROVIDER_SITE_OTHER): Payer: BC Managed Care – PPO | Admitting: Gynecology

## 2013-03-25 ENCOUNTER — Ambulatory Visit: Payer: BC Managed Care – PPO | Admitting: Gynecology

## 2013-03-25 ENCOUNTER — Telehealth: Payer: Self-pay | Admitting: *Deleted

## 2013-03-25 DIAGNOSIS — D251 Intramural leiomyoma of uterus: Secondary | ICD-10-CM

## 2013-03-25 DIAGNOSIS — N950 Postmenopausal bleeding: Secondary | ICD-10-CM | POA: Insufficient documentation

## 2013-03-25 DIAGNOSIS — N854 Malposition of uterus: Secondary | ICD-10-CM

## 2013-03-25 DIAGNOSIS — N882 Stricture and stenosis of cervix uteri: Secondary | ICD-10-CM

## 2013-03-25 DIAGNOSIS — N83339 Acquired atrophy of ovary and fallopian tube, unspecified side: Secondary | ICD-10-CM

## 2013-03-25 DIAGNOSIS — D259 Leiomyoma of uterus, unspecified: Secondary | ICD-10-CM

## 2013-03-25 MED ORDER — MEDROXYPROGESTERONE ACETATE 10 MG PO TABS: ORAL_TABLET | ORAL | Status: DC

## 2013-03-25 NOTE — Progress Notes (Signed)
Patient is a 57 year old who was asked to come to the office today for a sonohysterogram and endometrial biopsy. Patient was seen in the office on 03/10/2013 as a result of referral from her primary provider in reference to an irregularity on the anterior cervical lip and patient stated she's had some spotting on and off at different x2 the year.  Patient has had 2 previous  Cervical cerclages placed. Patient with anterior cervical hood as a result of 2 previous cerclages. Patient denies being a DES daughter. Patient stated recent Pap smear was normal as previous ones. At time of last visit there was stenosis of the cervical os and an endometrial biopsy was not able to be performed so she was placed on vaginal estrogen each bedtime for 2 weeks.  On examination today the anterior cervical hood was noted but once again cervical os could not be found. Her ultrasound today demonstrated the following:  Uterus measures 6.7 x 5.1 x 4.7 cm with endometrial stripe of 5.3 mm. Patient had 4 small intramural fibroids the largest one measuring 20 x 17 mm. Right and left ovary were normal a small calcified area in the left ovary measures 7 mm was noted. Several attempts were made with a lacrimal probe to identify a cervical os but was unsuccessful.  Assessment/plan: Patient is reporting irregular spotting at times in the past year could be atrophic bleed?we're going to place her on Provera 10 mg to take 1 by mouth daily for 10 days of each month for 3 months and then return to the office for an ultrasound in 4 months. If the endometrial stripe does not decrease or if she continues with spotting we'll need to resort possibly for hysterectomy because we could not establish a cervical os for sampling.

## 2013-03-25 NOTE — Telephone Encounter (Signed)
Message copied by Aura Camps on Wed Mar 25, 2013  2:17 PM ------      Message from: Ok Edwards      Created: Wed Mar 25, 2013  1:43 PM       Victorino Dike, please inform patient and that she last before I was going to explain to her the following: I would like for her to take Provera 10 mg one tablet daily for the first 10 days of each month beginning with the month of May for 3 months. She knows that she needs to see me in 4 months for followup ultrasound.I have already prescribed electronically her prescription. ------

## 2013-03-25 NOTE — Telephone Encounter (Signed)
Unable to leave message on pt voice mail

## 2013-03-26 NOTE — Telephone Encounter (Signed)
Pt informed with below note.  

## 2013-03-31 ENCOUNTER — Ambulatory Visit
Admission: RE | Admit: 2013-03-31 | Discharge: 2013-03-31 | Disposition: A | Discharge status: Home / Self Care | Payer: BC Managed Care – PPO | Source: Ambulatory Visit | Attending: Nurse Practitioner | Admitting: Nurse Practitioner

## 2013-03-31 ENCOUNTER — Other Ambulatory Visit: Payer: Self-pay | Admitting: Nurse Practitioner

## 2013-03-31 DIAGNOSIS — N6452 Nipple discharge: Secondary | ICD-10-CM

## 2013-03-31 DIAGNOSIS — N6310 Unspecified lump in the right breast, unspecified quadrant: Secondary | ICD-10-CM

## 2013-04-06 ENCOUNTER — Encounter: Payer: Self-pay | Admitting: Adult Health

## 2013-04-06 ENCOUNTER — Ambulatory Visit (INDEPENDENT_AMBULATORY_CARE_PROVIDER_SITE_OTHER): Payer: Medicare Other | Admitting: Adult Health

## 2013-04-06 VITALS — BP 128/76 | HR 76 | Temp 97.5°F | Ht 66.0 in | Wt 231.0 lb

## 2013-04-06 DIAGNOSIS — J45901 Unspecified asthma with (acute) exacerbation: Secondary | ICD-10-CM

## 2013-04-06 DIAGNOSIS — J45909 Unspecified asthma, uncomplicated: Secondary | ICD-10-CM

## 2013-04-06 NOTE — Assessment & Plan Note (Addendum)
Compensated on present regimen Continued nocturnal symtpoms w/ home sleep test neg for OSA-  Did have noct. Desats, -set up for ONO to check if stil has desats.    Plan  Continue on current regimen  We are setting you up for overnight oximetry test.  follow up Dr. Vassie Loll  In 3 months

## 2013-04-06 NOTE — Patient Instructions (Addendum)
Continue on current regimen  We are setting you up for overnight oximetry test.  follow up Dr. Vassie Loll  In 3 months

## 2013-04-06 NOTE — Progress Notes (Signed)
  Subjective:    Patient ID: Shelby Anderson, female    DOB: 1956/05/10, 57 y.o.   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 She had 2 flares in October & again dec '12 , jan' 13 responding quickly to prednisone tapers. She had  difficulty getting advair, now on dulera via pt assistance. She has been evaluated by Dr Mayford Knife for CAD & had a loop recorder evaluation at [facility] for syncope. EGD in 4/12 (Buccini) has shown peptic ulcer disease.  Spirometry did not show airway obstruction of asthma -No reversibility with bronchodilator  Her husband smokes but she denies any change in her environment. She has positive PPD.  She denies seasonal allergies, post nasal drip or breakthrough heartburn on nexium  01/31/2012  Pt c/o increased SOB, chest tightness worse when laying down , productive cough with brown/yellow x 6 days - no wheezing  Hemoptysis c/o coughing up bright red bloodx1  Facial Swelling right side of face swelling starting yesterday with pain  Tearful during interview due to general medical condition & inability to afford meds & doctor fees      02/27/12  >>acute OV, treated with levaquin for acute bronchitis & pred taper  CXR LLL nodular infx - resolved 5/13   01/05/13  Pt reports increase SOB, dry cough, chest congestion, wheezing, chest tx, temp 100-101.3, sweats at night x last tuesday. Pt states she was good for  3-4 days after the medications TP prescribed for her from 11/05/12 OV.  >> phenargan codeine/ mucinex/ xopenex neb, then started feeling worse again. She is having to use her rescue inhaler 2-3 times a day for past 4 ds Pt is wanting to know if she still needs oxygen--she now has insurance Febrile 100.4 , reports fever x past 4 ds - no colored sputum or burning micturition CXR - no new infx Labs- WC nml, UA  neg >>Steroid taper   04/06/2013 Follow up  3 month follow up - reports cough, dyspnea, dry cough, and wheezing are  slightly improved since last ov. No ER or hospital visits.  Uses SABA 1 x /week.  Still has dyspnea at night and wakes up short of breath.  Previous home sleep study neg for OSA , notable desats,  Recommended for Home O2 At bedtime  . Unable to afford d/t no insurance. She now has insurance.  No chest pain, hemoptysis, edema or rash.    Review of Systems  neg for any significant sore throat, dysphagia, itching, sneezing, nasal congestion or excess/ purulent secretions, chills, sweats, unintended wt loss, pleuritic or exertional cp, hempoptysis, orthopnea pnd or change in chronic leg swelling. Also denies presyncope, palpitations, heartburn, abdominal pain, nausea, vomiting, diarrhea or change in bowel or urinary habits, dysuria,hematuria, rash, arthralgias, visual complaints, headache, numbness weakness or ataxia.     Objective:   Physical Exam    Gen. Pleasant, obese, in no distress ENT - no lesions, no post nasal drip Neck: No JVD, no thyromegaly, no carotid bruits Lungs: no use of accessory muscles, no dullness to percussion, decreased without rales Cardiovascular: Rhythm regular, heart sounds  normal, no murmurs or gallops, no peripheral edema Musculoskeletal: No deformities, no cyanosis or clubbing , no tremors        Assessment & Plan:

## 2013-04-08 ENCOUNTER — Other Ambulatory Visit: Payer: Self-pay | Admitting: Nurse Practitioner

## 2013-04-08 ENCOUNTER — Ambulatory Visit
Admission: RE | Admit: 2013-04-08 | Discharge: 2013-04-08 | Disposition: A | Discharge status: Home / Self Care | Payer: BC Managed Care – PPO | Source: Ambulatory Visit | Attending: Nurse Practitioner | Admitting: Nurse Practitioner

## 2013-04-08 DIAGNOSIS — N6310 Unspecified lump in the right breast, unspecified quadrant: Secondary | ICD-10-CM

## 2013-04-08 HISTORY — PX: BREAST BIOPSY: SHX20

## 2013-04-09 ENCOUNTER — Encounter: Payer: Self-pay | Admitting: Gastroenterology

## 2013-04-24 ENCOUNTER — Encounter: Payer: Self-pay | Admitting: Gastroenterology

## 2013-04-24 ENCOUNTER — Ambulatory Visit (INDEPENDENT_AMBULATORY_CARE_PROVIDER_SITE_OTHER): Payer: Medicare Other | Admitting: Gastroenterology

## 2013-04-24 VITALS — BP 120/82 | HR 70 | Ht 66.0 in | Wt 226.8 lb

## 2013-04-24 DIAGNOSIS — K3184 Gastroparesis: Secondary | ICD-10-CM

## 2013-04-24 DIAGNOSIS — R109 Unspecified abdominal pain: Secondary | ICD-10-CM

## 2013-04-24 DIAGNOSIS — G894 Chronic pain syndrome: Secondary | ICD-10-CM

## 2013-04-24 DIAGNOSIS — R110 Nausea: Secondary | ICD-10-CM

## 2013-04-24 DIAGNOSIS — F1920 Other psychoactive substance dependence, uncomplicated: Secondary | ICD-10-CM

## 2013-04-24 DIAGNOSIS — F1120 Opioid dependence, uncomplicated: Secondary | ICD-10-CM

## 2013-04-24 MED ORDER — SUCRALFATE 1 GM/10ML PO SUSP: ORAL | Status: DC

## 2013-04-24 NOTE — Progress Notes (Signed)
History of Present Illness:  This is a complicated and involved 57 year old Caucasian female with multiple medical problems all over 20 medications.  She apparently sees multiple physicians and has had multiple testing.  Her PCP is Dr. Philipp Deputy.  She is a long-term patient of Dr. Molly Maduro Buccini , and apparently has recurrent peptic ulcer disease despite taking Nexium 40 mg twice a day.  She relates that she had endoscopy and colonoscopy in 2012, and of course we do not have these reports.  She complains of postprandial fullness with intermittent nausea and vomiting of partially digested food products without hematemesis, anorexia or weight loss.  She is chronic pain syndrome and takes frequent Advil and oxycodone, also suffers from chronic headaches.  She apparently is on a frequent small feedings diet and continues to have emptying problems.  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  She denies abuse of alcohol or cigarettes.  She does have mild COPD and is on variety of inhalers.  Allegedly, her test for sleep apnea was negative.  She relates that she is considering hysterectomy for reasons which are unclear.  There is no known history of gallbladder disease, last ultrasound in 2010.  He also sees multiple cardiologist for vague arrhythmias, but no definite diagnosis.  She denies melena, hematochezia, lower GI problems at this time.  Review of systems check her chart is almost entirely positive, she apparently suffers from severe depression and insomnia and migraine headaches.    reviewed this patient's present history, medical and surgical past history, allergies and medications.     ROS:   All systems were reviewed and are negative unless otherwise stated in the HPI.    Physical Exam: Healthy-appearing patient in no acute distress.  Blood pressure 128/76,  Pulse 76,weight 231 with a BMI of 37.3.  I cannot appreciate stigmata of chronic liver disease.  She is not short of  breath, and has a oxygen saturation of 95% on room air.   General well developed well nourished patient in no acute distress, appearing their stated age Eyes PERRLA, no icterus, fundoscopic exam per opthamologist Skin no lesions noted Neck supple, no adenopathy, no thyroid enlargement, no tenderness Chest clear to percussion and auscultation... no wheezes or rhonchi noted.   Heart no significant murmurs, gallops or rubs noted Abdomen no hepatosplenomegaly masses or tenderness, BS normal.  no succussion splash noted and bowel sounds normal.   Extremities no acute joint lesions, edema, phlebitis or evidence of cellulitis. Neurologic patient oriented x 3, cranial nerves intact, no focal neurologic deficits noted. Psychological mental status normal and normal affect.  Assessment and plan: This patient is an obvious consumer of multiple physicians at multiple institutions. Many of her diagnoses remain unclear, and she has chronic pain syndrome with chronic NSAID and narcotic use.  I'm not sure why she has quit seeing Dr.  Matthias Hughs at [facility] unless there were compliance problems.  It is hard to believe that she has ongoing peptic ulcer disease with twice a day Nexium, but we need to exclude partial gastric outlet obstruction, and I think she can tolerate conscious sedation with endoscopy since her  pulmonary situation appears to be good at this time.  I also will check upper abdominal ultrasound exam to exclude cholelithiasis,, and try to obtain previous records from Dr.  Matthias Hughs for review.  We've instructed her in a standard gastroparesis frequent small feedings diet.  She may be a candidate for domperidone versus referral  to a tertiary Medical Center such as [facility] where she apparently already goes for cardiology appointments.  She has a history of multiple drug allergies listed and reviewed her chart.  This will be a very difficult patient to treat long-term.  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  Encounter Diagnoses  Name Primary?  . Abdominal pain, unspecified site Yes  . Nausea alone

## 2013-04-24 NOTE — Patient Instructions (Addendum)
You have been scheduled for an abdominal ultrasound at [facility] (1st floor of hospital) on at 04-28-13 at 8:30 am. Please arrive 15 minutes prior to your appointment for registration. Make certain not to have anything to eat or drink 6 hours prior to your appointment. Should you need to reschedule your appointment, please contact radiology at [phone]. This test typically takes about 30 minutes to perform.  You have been scheduled for an endoscopy with propofol. Please follow written instructions given to you at your visit today. If you use inhalers (even only as needed), please bring them with you on the day of your procedure. Your physician has requested that you go to www.startemmi.com and enter the access code given to you at your visit today. This web site gives a general overview about your procedure. However, you should still follow specific instructions given to you by our office regarding your preparation for the procedure.  Gastroparesis diet given today.  We have sent the following medications to your pharmacy for you to pick up at your convenience: Carafate, please take as directed

## 2013-04-28 ENCOUNTER — Ambulatory Visit (HOSPITAL_COMMUNITY): Payer: BC Managed Care – PPO

## 2013-04-28 ENCOUNTER — Ambulatory Visit (HOSPITAL_COMMUNITY)
Admission: RE | Admit: 2013-04-28 | Discharge: 2013-04-28 | Disposition: A | Discharge status: Home / Self Care | Payer: Medicare Other | Source: Ambulatory Visit | Attending: Gastroenterology | Admitting: Gastroenterology

## 2013-04-28 DIAGNOSIS — R110 Nausea: Secondary | ICD-10-CM

## 2013-04-28 DIAGNOSIS — R109 Unspecified abdominal pain: Secondary | ICD-10-CM

## 2013-04-28 DIAGNOSIS — K838 Other specified diseases of biliary tract: Secondary | ICD-10-CM | POA: Insufficient documentation

## 2013-04-28 DIAGNOSIS — R112 Nausea with vomiting, unspecified: Secondary | ICD-10-CM | POA: Insufficient documentation

## 2013-04-29 ENCOUNTER — Ambulatory Visit (INDEPENDENT_AMBULATORY_CARE_PROVIDER_SITE_OTHER): Payer: Medicare Other | Admitting: Gastroenterology

## 2013-04-29 ENCOUNTER — Encounter: Payer: Self-pay | Admitting: Gastroenterology

## 2013-04-29 ENCOUNTER — Telehealth: Payer: Self-pay | Admitting: *Deleted

## 2013-04-29 VITALS — BP 150/81 | HR 59 | Temp 98.9°F | Resp 20 | Ht 66.0 in | Wt 226.0 lb

## 2013-04-29 DIAGNOSIS — K2990 Gastroduodenitis, unspecified, without bleeding: Secondary | ICD-10-CM

## 2013-04-29 DIAGNOSIS — K2970 Gastritis, unspecified, without bleeding: Secondary | ICD-10-CM

## 2013-04-29 DIAGNOSIS — R110 Nausea: Secondary | ICD-10-CM

## 2013-04-29 DIAGNOSIS — R1013 Epigastric pain: Secondary | ICD-10-CM

## 2013-04-29 DIAGNOSIS — R198 Other specified symptoms and signs involving the digestive system and abdomen: Secondary | ICD-10-CM

## 2013-04-29 DIAGNOSIS — R112 Nausea with vomiting, unspecified: Secondary | ICD-10-CM

## 2013-04-29 DIAGNOSIS — K219 Gastro-esophageal reflux disease without esophagitis: Secondary | ICD-10-CM

## 2013-04-29 DIAGNOSIS — D1330 Benign neoplasm of unspecified part of small intestine: Secondary | ICD-10-CM

## 2013-04-29 MED ORDER — PROMETHAZINE HCL 25 MG PO TABS: 25.0000 mg | ORAL_TABLET | Freq: Every evening | ORAL | Status: DC | PRN

## 2013-04-29 MED ORDER — SODIUM CHLORIDE 0.9 % IV SOLN: 500.0000 mL | INTRAVENOUS | Status: DC

## 2013-04-29 NOTE — Patient Instructions (Addendum)

## 2013-04-29 NOTE — Progress Notes (Signed)
Called to room to assist during endoscopic procedure.  Patient ID and intended procedure confirmed with present staff. Received instructions for my participation in the procedure from the performing physician.  

## 2013-04-29 NOTE — Progress Notes (Signed)
Patient did not experience any of the following events: a burn prior to discharge; a fall within the facility; wrong site/side/patient/procedure/implant event; or a hospital transfer or hospital admission upon discharge from the facility. (G8907) Patient did not have preoperative order for IV antibiotic SSI prophylaxis. (G8918)  

## 2013-04-29 NOTE — Op Note (Signed)
[facility] [address] [place], [address]   ENDOSCOPY PROCEDURE REPORT  PATIENT: Lyn, Joens  MR#: [id number] BIRTHDATE: 01-07-1956 , 57  yrs. old GENDER: Female ENDOSCOPIST:David Hale Bogus, MD, Clementeen Graham REFERRED BY: Tomi Bamberger, NP PROCEDURE DATE:  04/29/2013 PROCEDURE:   EGD w/ biopsy for H.pylori and EGD w/ biopsy ASA CLASS:    Class III INDICATIONS: Vomiting. MEDICATION: propofol (Diprivan) 150mg  IV TOPICAL ANESTHETIC:   Cetacaine Spray  DESCRIPTION OF PROCEDURE:   After the risks and benefits of the procedure were explained, informed consent was obtained.  The LB [id number]  endoscope was introduced through the mouth  and advanced to the second portion of the duodenum .  The instrument was slowly withdrawn as the mucosa was fully examined.      DUODENUM: The duodenal mucosa showed no abnormalities in the bulb and second portion of the duodenum.  Cold forcep biopsies were taken in the second portion.  STOMACH: There was mild antral gastropathy noted.  Random  cold forcep biopsies were taken at the antrum. and CLo Bx. done  ESOPHAGUS: There was LA Class A esophagitis noted. small hiatal hernia noted.  Biopsies were obtained of the distal esophagus for pathologic exam.  There were no erosions or ulceration or stricturing noted.   Retroflexed views revealed a hiatal hernia. The scope was then withdrawn from the patient and the procedure completed.  COMPLICATIONS: There were no complications.   ENDOSCOPIC IMPRESSION: 1.   The duodenal mucosa showed no abnormalities in the bulb and second portion of the duodenum .Marland Kitchenbiopsies done to exclude celiac disease. 2.   There was mild antral gastropathy noted [T2] ..biopsies done to exclude H. pylori infection. 3.   There was LA Class A esophagitis noted ...biopsies done to exclude Barrett's mucosa. This patient's symptoms are related acid reflux.  She may have an underlying is gastric  motility disorder.Recent upper abdominal ultrasound exam showed some biliary sludge but otherwise was unremarkable.  RECOMMENDATIONS: 1.  Await biopsy results 2.  Continue PPI 3.  Follow-up of helicobacter pylori status, treat if indicated4 4. Gastric 4. gastric emptying scan to assess possible gastroparesis.  Her emptying problems may be related to multiple medications.  _______________________________ eSignedMardella Layman, MD, [facility] 04/29/2013 3:32 PM      PATIENT NAME:  Nadiyah, Zeis MR#: [id number]

## 2013-04-30 ENCOUNTER — Other Ambulatory Visit: Payer: Self-pay | Admitting: Pulmonary Disease

## 2013-04-30 ENCOUNTER — Telehealth: Payer: Self-pay | Admitting: *Deleted

## 2013-04-30 LAB — HELICOBACTER PYLORI SCREEN-BIOPSY: UREASE: NEGATIVE

## 2013-04-30 NOTE — Telephone Encounter (Signed)
  Follow up Call-  Call back number 04/29/2013  Post procedure Call Back phone  # [phone]  Permission to leave phone message Yes     Patient questions:  Do you have a fever, pain , or abdominal swelling? no Pain Score  0 *  Have you tolerated food without any problems? yes  Have you been able to return to your normal activities? yes  Do you have any questions about your discharge instructions: Diet   no Medications  no Follow up visit  no  Do you have questions or concerns about your Care? no  Actions: * If pain score is 4 or above: No action needed, pain <4.  Pt states that she had several gas pain at 3am this morning and started throwing up, it lasted for about 45 mins, pt states she is no longer having any problems, no pain, fever, or nausea, advised pt to call back if she had anymore problems

## 2013-04-30 NOTE — Telephone Encounter (Signed)
Informed pt of GES appt ; she may go 05/01/13 at 1pm if she can be NPO for 6 hr prior. Pt stated she could and she will hold her GI meds for 24 hours.

## 2013-05-01 ENCOUNTER — Encounter (HOSPITAL_COMMUNITY): Payer: Self-pay

## 2013-05-01 ENCOUNTER — Encounter (HOSPITAL_COMMUNITY)
Admission: RE | Admit: 2013-05-01 | Discharge: 2013-05-01 | Disposition: A | Discharge status: Home / Self Care | Payer: Medicare Other | Source: Ambulatory Visit | Attending: Gastroenterology | Admitting: Gastroenterology

## 2013-05-01 ENCOUNTER — Encounter: Payer: Self-pay | Admitting: Gastroenterology

## 2013-05-01 DIAGNOSIS — R112 Nausea with vomiting, unspecified: Secondary | ICD-10-CM | POA: Insufficient documentation

## 2013-05-01 MED ORDER — TECHNETIUM TC 99M SULFUR COLLOID
2.0000 | Freq: Once | INTRAVENOUS | Status: AC | PRN
  Administered 2013-05-01: 2 via ORAL

## 2013-05-04 ENCOUNTER — Encounter: Payer: Self-pay | Admitting: Gastroenterology

## 2013-07-01 ENCOUNTER — Other Ambulatory Visit: Payer: Self-pay | Admitting: Gastroenterology

## 2013-07-24 ENCOUNTER — Other Ambulatory Visit: Payer: Self-pay | Admitting: Gastroenterology

## 2013-07-29 ENCOUNTER — Ambulatory Visit (INDEPENDENT_AMBULATORY_CARE_PROVIDER_SITE_OTHER): Payer: Medicare Other

## 2013-07-29 ENCOUNTER — Ambulatory Visit (INDEPENDENT_AMBULATORY_CARE_PROVIDER_SITE_OTHER): Payer: Medicare Other | Admitting: Gynecology

## 2013-07-29 ENCOUNTER — Encounter: Payer: Self-pay | Admitting: Gynecology

## 2013-07-29 DIAGNOSIS — N950 Postmenopausal bleeding: Secondary | ICD-10-CM

## 2013-07-29 DIAGNOSIS — N83339 Acquired atrophy of ovary and fallopian tube, unspecified side: Secondary | ICD-10-CM

## 2013-07-29 DIAGNOSIS — D259 Leiomyoma of uterus, unspecified: Secondary | ICD-10-CM

## 2013-07-29 DIAGNOSIS — R9389 Abnormal findings on diagnostic imaging of other specified body structures: Secondary | ICD-10-CM

## 2013-07-29 NOTE — Progress Notes (Addendum)
The patient is a 57 year old who presented to the office today for an ultrasound and followup as a result of postmenopausal bleeding and thickened endometrium.Patient was seen in the office on 03/10/2013 as a result of referral from her primary provider in reference to an irregularity on the anterior cervical lip and patient stated she's had some spotting on and off at different x2 the year.  Patient has had 2 previous Cervical cerclages placed. Patient with anterior cervical hood as a result of 2 previous cerclages. Patient denies being a DES daughter. Patient stated recent Pap smear was normal as previous ones. At time of last visit there was stenosis of the cervical os and an endometrial biopsy was not able to be performed so she was placed on vaginal estrogen each bedtime for 2 weeks.  Patient was seen in the office last on 03/25/2013 whereby she was noted to have on pelvic exam an anterior cervical hood was noted but once again cervical os could not be found. Her ultrasound today demonstrated the following:  Uterus measures 6.7 x 5.1 x 4.7 cm with endometrial stripe of 5.3 mm. Patient had 4 small intramural fibroids the largest one measuring 20 x 17 mm. Right and left ovary were normal a small calcified area in the left ovary measures 7 mm was noted. Several attempts were made with a lacrimal probe to identify a cervical os but was unsuccessful.  It was believed that her bleeding may have been an atrophic bleed. She was started on Provera 10 mg one daily for 10 days of each month for 3 months and return to the office for an ultrasound for measurement her were endometrial stripe.  Ultrasound today: Uterus 6.3 x 5.6 x 4.7 cm with endometrial stripe of 4.2 mm. Endometrium was avascular. Right ovary normal left ovary a solid focus 4 mm avascular was noted otherwise negative cul-de-sac.  Isolated event during the year of postmenopausal spotting probably atrophic bleed versus from intercourse. Patient on  no HRT. Patient states that she can cope with her vasomotor symptoms. She will keep a calendar for this next 6 months if she continues to have any further bleeding she'll return back to the office for further assessment.I would like her to return back in 6 months with a passer will be done as well. Her last Pap smear was normal 2 years ago.

## 2013-07-31 ENCOUNTER — Other Ambulatory Visit: Payer: Self-pay | Admitting: Pulmonary Disease

## 2013-08-20 ENCOUNTER — Encounter: Payer: Self-pay | Admitting: Pulmonary Disease

## 2013-08-20 ENCOUNTER — Ambulatory Visit (INDEPENDENT_AMBULATORY_CARE_PROVIDER_SITE_OTHER): Payer: Medicare Other | Admitting: Pulmonary Disease

## 2013-08-20 VITALS — BP 132/86 | HR 87 | Temp 98.2°F | Ht 66.0 in | Wt 228.0 lb

## 2013-08-20 DIAGNOSIS — Z23 Encounter for immunization: Secondary | ICD-10-CM

## 2013-08-20 DIAGNOSIS — J45901 Unspecified asthma with (acute) exacerbation: Secondary | ICD-10-CM

## 2013-08-20 MED ORDER — MOMETASONE FURO-FORMOTEROL FUM 200-5 MCG/ACT IN AERO: 2.0000 | INHALATION_SPRAY | Freq: Two times a day (BID) | RESPIRATORY_TRACT | Status: DC

## 2013-08-20 MED ORDER — ALBUTEROL SULFATE HFA 108 (90 BASE) MCG/ACT IN AERS: 2.0000 | INHALATION_SPRAY | RESPIRATORY_TRACT | Status: DC | PRN

## 2013-08-20 NOTE — Progress Notes (Signed)
  Subjective:    Patient ID: Shelby Anderson, female    DOB: 14-Jun-1956, 57 y.o.   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  She was evaluated for CAD & has a loop recorder evaluation at [facility] for syncope.  EGD in 4/12 (Buccini) has shown peptic ulcer disease.  Spirometry did not show airway obstruction of asthma -No reversibility with bronchodilator  Her husband smokes but she denies any change in her environment. She has positive PPD.   CXR LLL nodular infx - resolved 5/13    08/20/2013  Breathing is unchanged since last OV. At times having some wheezing and when taking deep breathes sharp in mid chest C/o SSCP, pleuritic  Loop recorder Compliant with dulera Home sleep test 2013  - showed desatn, but could not afford O2 She denies seasonal allergies, post nasal drip or breakthrough heartburn on nexium Last Steroid taper 01/2013 - has done well since - No ER or hospital visits.  Uses SABA 1 x /week.   Review of Systems neg for any significant sore throat, dysphagia, itching, sneezing, nasal congestion or excess/ purulent secretions, fever, chills, sweats, unintended wt loss, pleuritic or exertional cp, hempoptysis, orthopnea pnd or change in chronic leg swelling. Also denies presyncope, palpitations, heartburn, abdominal pain, nausea, vomiting, diarrhea or change in bowel or urinary habits, dysuria,hematuria, rash, arthralgias, visual complaints, headache, numbness weakness or ataxia.     Objective:   Physical Exam  Gen. Pleasant, obese, in no distress ENT - no lesions, no post nasal drip Neck: No JVD, no thyromegaly, no carotid bruits Lungs: no use of accessory muscles, no dullness to percussion, decreased without rales or rhonchi  Cardiovascular: Rhythm regular, heart sounds  normal, no murmurs  or gallops, no peripheral edema Musculoskeletal: No deformities, no cyanosis or clubbing , no tremors        Assessment & Plan:

## 2013-08-20 NOTE — Assessment & Plan Note (Signed)
FLu shot Check your oxygen level during sleep Refills on dulera & rescue albuterol 

## 2013-08-20 NOTE — Patient Instructions (Addendum)
FLu shot Check your oxygen level during sleep Refills on dulera & rescue albuterol

## 2013-09-13 ENCOUNTER — Telehealth: Payer: Self-pay | Admitting: Pulmonary Disease

## 2013-09-13 NOTE — Telephone Encounter (Signed)
ONO showed desatn x 14 mins only No need for O2

## 2013-09-15 NOTE — Telephone Encounter (Signed)
I spoke with patient about results and she verbalized understanding and had no questions 

## 2013-11-04 ENCOUNTER — Other Ambulatory Visit: Payer: Self-pay | Admitting: Anesthesiology

## 2013-11-04 ENCOUNTER — Ambulatory Visit
Admission: RE | Admit: 2013-11-04 | Discharge: 2013-11-04 | Disposition: A | Discharge status: Home / Self Care | Payer: Medicare Other | Source: Ambulatory Visit | Attending: Anesthesiology | Admitting: Anesthesiology

## 2013-11-04 DIAGNOSIS — M25551 Pain in right hip: Secondary | ICD-10-CM

## 2013-12-23 ENCOUNTER — Encounter: Payer: Self-pay | Admitting: Pulmonary Disease

## 2013-12-23 ENCOUNTER — Inpatient Hospital Stay (HOSPITAL_COMMUNITY)
Admission: AD | Admit: 2013-12-23 | Discharge: 2014-01-01 | DRG: 194 | Disposition: A | Discharge status: Home / Self Care | Payer: Medicare Other | Source: Ambulatory Visit | Attending: Pulmonary Disease | Admitting: Pulmonary Disease

## 2013-12-23 ENCOUNTER — Encounter (HOSPITAL_COMMUNITY): Payer: Self-pay

## 2013-12-23 ENCOUNTER — Ambulatory Visit (INDEPENDENT_AMBULATORY_CARE_PROVIDER_SITE_OTHER): Payer: Medicare Other | Admitting: Pulmonary Disease

## 2013-12-23 ENCOUNTER — Inpatient Hospital Stay (HOSPITAL_COMMUNITY): Payer: Medicare Other

## 2013-12-23 VITALS — BP 118/76 | HR 87 | Temp 98.2°F

## 2013-12-23 DIAGNOSIS — K219 Gastro-esophageal reflux disease without esophagitis: Secondary | ICD-10-CM | POA: Diagnosis present

## 2013-12-23 DIAGNOSIS — M069 Rheumatoid arthritis, unspecified: Secondary | ICD-10-CM | POA: Diagnosis present

## 2013-12-23 DIAGNOSIS — F329 Major depressive disorder, single episode, unspecified: Secondary | ICD-10-CM | POA: Diagnosis present

## 2013-12-23 DIAGNOSIS — E039 Hypothyroidism, unspecified: Secondary | ICD-10-CM | POA: Diagnosis present

## 2013-12-23 DIAGNOSIS — G4733 Obstructive sleep apnea (adult) (pediatric): Secondary | ICD-10-CM | POA: Diagnosis present

## 2013-12-23 DIAGNOSIS — R51 Headache: Secondary | ICD-10-CM | POA: Diagnosis present

## 2013-12-23 DIAGNOSIS — R0789 Other chest pain: Secondary | ICD-10-CM

## 2013-12-23 DIAGNOSIS — J45901 Unspecified asthma with (acute) exacerbation: Secondary | ICD-10-CM | POA: Diagnosis present

## 2013-12-23 DIAGNOSIS — I2510 Atherosclerotic heart disease of native coronary artery without angina pectoris: Secondary | ICD-10-CM | POA: Diagnosis present

## 2013-12-23 DIAGNOSIS — B974 Respiratory syncytial virus as the cause of diseases classified elsewhere: Secondary | ICD-10-CM | POA: Diagnosis present

## 2013-12-23 DIAGNOSIS — R110 Nausea: Secondary | ICD-10-CM

## 2013-12-23 DIAGNOSIS — J45909 Unspecified asthma, uncomplicated: Secondary | ICD-10-CM | POA: Diagnosis present

## 2013-12-23 DIAGNOSIS — N950 Postmenopausal bleeding: Secondary | ICD-10-CM | POA: Diagnosis present

## 2013-12-23 DIAGNOSIS — E876 Hypokalemia: Secondary | ICD-10-CM | POA: Diagnosis present

## 2013-12-23 DIAGNOSIS — I252 Old myocardial infarction: Secondary | ICD-10-CM

## 2013-12-23 DIAGNOSIS — I10 Essential (primary) hypertension: Secondary | ICD-10-CM | POA: Diagnosis present

## 2013-12-23 DIAGNOSIS — Z8249 Family history of ischemic heart disease and other diseases of the circulatory system: Secondary | ICD-10-CM

## 2013-12-23 DIAGNOSIS — R079 Chest pain, unspecified: Secondary | ICD-10-CM | POA: Insufficient documentation

## 2013-12-23 DIAGNOSIS — R1310 Dysphagia, unspecified: Secondary | ICD-10-CM

## 2013-12-23 DIAGNOSIS — R112 Nausea with vomiting, unspecified: Secondary | ICD-10-CM | POA: Diagnosis present

## 2013-12-23 DIAGNOSIS — F3289 Other specified depressive episodes: Secondary | ICD-10-CM | POA: Diagnosis present

## 2013-12-23 DIAGNOSIS — J189 Pneumonia, unspecified organism: Principal | ICD-10-CM | POA: Diagnosis present

## 2013-12-23 DIAGNOSIS — Z7982 Long term (current) use of aspirin: Secondary | ICD-10-CM

## 2013-12-23 DIAGNOSIS — B338 Other specified viral diseases: Secondary | ICD-10-CM | POA: Diagnosis present

## 2013-12-23 DIAGNOSIS — G894 Chronic pain syndrome: Secondary | ICD-10-CM | POA: Diagnosis present

## 2013-12-23 DIAGNOSIS — Z8711 Personal history of peptic ulcer disease: Secondary | ICD-10-CM

## 2013-12-23 DIAGNOSIS — K222 Esophageal obstruction: Secondary | ICD-10-CM | POA: Diagnosis present

## 2013-12-23 DIAGNOSIS — R933 Abnormal findings on diagnostic imaging of other parts of digestive tract: Secondary | ICD-10-CM

## 2013-12-23 DIAGNOSIS — IMO0001 Reserved for inherently not codable concepts without codable children: Secondary | ICD-10-CM | POA: Diagnosis present

## 2013-12-23 DIAGNOSIS — G8929 Other chronic pain: Secondary | ICD-10-CM | POA: Diagnosis present

## 2013-12-23 DIAGNOSIS — F411 Generalized anxiety disorder: Secondary | ICD-10-CM | POA: Diagnosis present

## 2013-12-23 DIAGNOSIS — J209 Acute bronchitis, unspecified: Secondary | ICD-10-CM | POA: Diagnosis present

## 2013-12-23 LAB — CBC WITH DIFFERENTIAL/PLATELET
BASOS ABS: 0 10*3/uL (ref 0.0–0.1)
Basophils Relative: 0 % (ref 0–1)
EOS ABS: 0.1 10*3/uL (ref 0.0–0.7)
EOS PCT: 1 % (ref 0–5)
HCT: 42.1 % (ref 36.0–46.0)
Hemoglobin: 13.8 g/dL (ref 12.0–15.0)
LYMPHS PCT: 8 % — AB (ref 12–46)
Lymphs Abs: 0.9 10*3/uL (ref 0.7–4.0)
MCH: 29.1 pg (ref 26.0–34.0)
MCHC: 32.8 g/dL (ref 30.0–36.0)
MCV: 88.6 fL (ref 78.0–100.0)
Monocytes Absolute: 0.9 10*3/uL (ref 0.1–1.0)
Monocytes Relative: 8 % (ref 3–12)
Neutro Abs: 9.3 10*3/uL — ABNORMAL HIGH (ref 1.7–7.7)
Neutrophils Relative %: 83 % — ABNORMAL HIGH (ref 43–77)
Platelets: 320 10*3/uL (ref 150–400)
RBC: 4.75 MIL/uL (ref 3.87–5.11)
RDW: 13.2 % (ref 11.5–15.5)
WBC: 11.3 10*3/uL — AB (ref 4.0–10.5)

## 2013-12-23 LAB — URINALYSIS, ROUTINE W REFLEX MICROSCOPIC
Glucose, UA: NEGATIVE mg/dL
Hgb urine dipstick: NEGATIVE
NITRITE: NEGATIVE
PH: 6 (ref 5.0–8.0)
PROTEIN: 30 mg/dL — AB
Specific Gravity, Urine: 1.028 (ref 1.005–1.030)
Urobilinogen, UA: 1 mg/dL (ref 0.0–1.0)

## 2013-12-23 LAB — URINE MICROSCOPIC-ADD ON

## 2013-12-23 LAB — COMPREHENSIVE METABOLIC PANEL
ALT: 24 U/L (ref 0–35)
AST: 24 U/L (ref 0–37)
Albumin: 3 g/dL — ABNORMAL LOW (ref 3.5–5.2)
Alkaline Phosphatase: 84 U/L (ref 39–117)
BUN: 11 mg/dL (ref 6–23)
CALCIUM: 9.6 mg/dL (ref 8.4–10.5)
CO2: 32 meq/L (ref 19–32)
Chloride: 100 mEq/L (ref 96–112)
Creatinine, Ser: 0.58 mg/dL (ref 0.50–1.10)
Glucose, Bld: 146 mg/dL — ABNORMAL HIGH (ref 70–99)
Potassium: 3.1 mEq/L — ABNORMAL LOW (ref 3.7–5.3)
SODIUM: 146 meq/L (ref 137–147)
TOTAL PROTEIN: 8 g/dL (ref 6.0–8.3)
Total Bilirubin: 0.7 mg/dL (ref 0.3–1.2)

## 2013-12-23 LAB — PROCALCITONIN: Procalcitonin: 0.14 ng/mL

## 2013-12-23 LAB — TROPONIN I: Troponin I: <0.3 ng/mL (ref <0.30)

## 2013-12-23 LAB — STREP PNEUMONIAE URINARY ANTIGEN: Strep Pneumo Urinary Antigen: NEGATIVE

## 2013-12-23 LAB — MAGNESIUM: Magnesium: 1.9 mg/dL (ref 1.5–2.5)

## 2013-12-23 LAB — LIPASE, BLOOD: LIPASE: 23 U/L (ref 11–59)

## 2013-12-23 LAB — PHOSPHORUS: PHOSPHORUS: 2.5 mg/dL (ref 2.3–4.6)

## 2013-12-23 MED ORDER — FLUDROCORTISONE ACETATE 0.1 MG PO TABS
0.1000 mg | ORAL_TABLET | Freq: Two times a day (BID) | ORAL | Status: DC
  Administered 2013-12-24 – 2014-01-01 (×17): 0.1 mg via ORAL
  Filled 2013-12-23 (×19): qty 1

## 2013-12-23 MED ORDER — ENOXAPARIN SODIUM 40 MG/0.4ML ~~LOC~~ SOLN
40.0000 mg | SUBCUTANEOUS | Status: DC
  Administered 2013-12-24 – 2013-12-31 (×9): 40 mg via SUBCUTANEOUS
  Filled 2013-12-23 (×10): qty 0.4

## 2013-12-23 MED ORDER — GABAPENTIN 300 MG PO CAPS
300.0000 mg | ORAL_CAPSULE | Freq: Four times a day (QID) | ORAL | Status: DC
  Administered 2013-12-24 – 2014-01-01 (×34): 300 mg via ORAL
  Filled 2013-12-23 (×38): qty 1

## 2013-12-23 MED ORDER — PANTOPRAZOLE SODIUM 40 MG PO TBEC
40.0000 mg | DELAYED_RELEASE_TABLET | Freq: Two times a day (BID) | ORAL | Status: DC
  Administered 2013-12-24: 40 mg via ORAL
  Filled 2013-12-23 (×3): qty 1

## 2013-12-23 MED ORDER — OSELTAMIVIR PHOSPHATE 75 MG PO CAPS
75.0000 mg | ORAL_CAPSULE | Freq: Two times a day (BID) | ORAL | Status: DC
  Administered 2013-12-23 – 2013-12-24 (×3): 75 mg via ORAL
  Filled 2013-12-23 (×4): qty 1

## 2013-12-23 MED ORDER — MOMETASONE FURO-FORMOTEROL FUM 200-5 MCG/ACT IN AERO
2.0000 | INHALATION_SPRAY | Freq: Two times a day (BID) | RESPIRATORY_TRACT | Status: DC
  Administered 2013-12-23 – 2014-01-01 (×18): 2 via RESPIRATORY_TRACT
  Filled 2013-12-23: qty 8.8

## 2013-12-23 MED ORDER — MONTELUKAST SODIUM 10 MG PO TABS
10.0000 mg | ORAL_TABLET | Freq: Every day | ORAL | Status: DC
  Administered 2013-12-24 – 2013-12-31 (×8): 10 mg via ORAL
  Filled 2013-12-23 (×11): qty 1

## 2013-12-23 MED ORDER — SODIUM CHLORIDE 0.9 % IV SOLN
INTRAVENOUS | Status: DC
  Administered 2013-12-23 – 2013-12-29 (×8): via INTRAVENOUS
  Administered 2013-12-30: 1000 mL via INTRAVENOUS
  Administered 2013-12-30 – 2014-01-01 (×5): via INTRAVENOUS

## 2013-12-23 MED ORDER — SODIUM CHLORIDE 0.9 % IV SOLN: 250.0000 mL | INTRAVENOUS | Status: DC | PRN

## 2013-12-23 MED ORDER — ISOSORBIDE MONONITRATE ER 60 MG PO TB24
60.0000 mg | ORAL_TABLET | Freq: Every day | ORAL | Status: DC
  Administered 2013-12-24 – 2014-01-01 (×9): 60 mg via ORAL
  Filled 2013-12-23 (×10): qty 1

## 2013-12-23 MED ORDER — L-METHYLFOLATE-B6-B12 3-35-2 MG PO TABS
1.0000 | ORAL_TABLET | Freq: Every day | ORAL | Status: DC
  Administered 2013-12-25 – 2014-01-01 (×8): 1 via ORAL
  Filled 2013-12-23 (×10): qty 1

## 2013-12-23 MED ORDER — POTASSIUM CHLORIDE CRYS ER 20 MEQ PO TBCR
40.0000 meq | EXTENDED_RELEASE_TABLET | ORAL | Status: AC
  Filled 2013-12-23 (×2): qty 2

## 2013-12-23 MED ORDER — ASPIRIN EC 81 MG PO TBEC
81.0000 mg | DELAYED_RELEASE_TABLET | Freq: Every day | ORAL | Status: DC
  Administered 2013-12-24 – 2014-01-01 (×9): 81 mg via ORAL
  Filled 2013-12-23 (×9): qty 1

## 2013-12-23 MED ORDER — LEVOFLOXACIN IN D5W 750 MG/150ML IV SOLN
750.0000 mg | INTRAVENOUS | Status: AC
  Administered 2013-12-23 – 2013-12-27 (×5): 750 mg via INTRAVENOUS
  Filled 2013-12-23 (×5): qty 150

## 2013-12-23 MED ORDER — ADULT MULTIVITAMIN W/MINERALS CH
1.0000 | ORAL_TABLET | Freq: Every day | ORAL | Status: DC
  Administered 2013-12-25 – 2013-12-30 (×6): 1 via ORAL
  Filled 2013-12-23 (×9): qty 1

## 2013-12-23 MED ORDER — OXYCODONE HCL 5 MG PO TABS
30.0000 mg | ORAL_TABLET | ORAL | Status: DC | PRN
  Administered 2013-12-26 – 2014-01-01 (×30): 30 mg via ORAL
  Filled 2013-12-23 (×31): qty 6

## 2013-12-23 MED ORDER — MORPHINE SULFATE 2 MG/ML IJ SOLN
2.0000 mg | INTRAMUSCULAR | Status: DC | PRN
  Administered 2013-12-23 – 2013-12-24 (×4): 4 mg via INTRAVENOUS
  Administered 2013-12-24 (×2): 2 mg via INTRAVENOUS
  Administered 2013-12-24: 4 mg via INTRAVENOUS
  Administered 2013-12-25 – 2013-12-26 (×10): 2 mg via INTRAVENOUS
  Filled 2013-12-23 (×4): qty 1
  Filled 2013-12-23: qty 2
  Filled 2013-12-23 (×4): qty 1
  Filled 2013-12-23: qty 2
  Filled 2013-12-23 (×2): qty 1
  Filled 2013-12-23 (×2): qty 2
  Filled 2013-12-23: qty 1
  Filled 2013-12-23: qty 2
  Filled 2013-12-23: qty 1

## 2013-12-23 MED ORDER — ATORVASTATIN CALCIUM 40 MG PO TABS
40.0000 mg | ORAL_TABLET | Freq: Every day | ORAL | Status: DC
  Administered 2013-12-24 – 2013-12-31 (×8): 40 mg via ORAL
  Filled 2013-12-23 (×10): qty 1

## 2013-12-23 MED ORDER — DEPLIN 7.5 MG PO TABS: 7.5000 mg | ORAL_TABLET | Freq: Every day | ORAL | Status: DC

## 2013-12-23 MED ORDER — PROMETHAZINE HCL 25 MG PO TABS
25.0000 mg | ORAL_TABLET | Freq: Four times a day (QID) | ORAL | Status: DC | PRN
  Administered 2013-12-23 – 2013-12-24 (×3): 25 mg via ORAL
  Filled 2013-12-23 (×4): qty 1

## 2013-12-23 MED ORDER — ONDANSETRON HCL 4 MG/2ML IJ SOLN
4.0000 mg | Freq: Four times a day (QID) | INTRAMUSCULAR | Status: DC | PRN
  Administered 2013-12-23 – 2013-12-25 (×4): 4 mg via INTRAVENOUS
  Filled 2013-12-23 (×4): qty 2

## 2013-12-23 MED ORDER — LEVOTHYROXINE SODIUM 25 MCG PO TABS
25.0000 ug | ORAL_TABLET | Freq: Every day | ORAL | Status: DC
  Administered 2013-12-24 – 2014-01-01 (×9): 25 ug via ORAL
  Filled 2013-12-23 (×11): qty 1

## 2013-12-23 MED ORDER — ENOXAPARIN SODIUM 40 MG/0.4ML ~~LOC~~ SOLN
40.0000 mg | SUBCUTANEOUS | Status: DC
  Filled 2013-12-23: qty 0.4

## 2013-12-23 MED ORDER — POTASSIUM CHLORIDE 10 MEQ/100ML IV SOLN
10.0000 meq | INTRAVENOUS | Status: DC
  Filled 2013-12-23 (×3): qty 100

## 2013-12-23 MED ORDER — LIDOCAINE 5 % EX PTCH
2.0000 | MEDICATED_PATCH | CUTANEOUS | Status: DC | PRN
  Administered 2013-12-24: 2 via TRANSDERMAL
  Filled 2013-12-23 (×3): qty 2

## 2013-12-23 MED ORDER — METOPROLOL SUCCINATE ER 50 MG PO TB24
50.0000 mg | ORAL_TABLET | Freq: Two times a day (BID) | ORAL | Status: DC
  Administered 2013-12-24 – 2014-01-01 (×17): 50 mg via ORAL
  Filled 2013-12-23 (×19): qty 1

## 2013-12-23 MED ORDER — SUCRALFATE 1 GM/10ML PO SUSP
2.0000 g | Freq: Three times a day (TID) | ORAL | Status: DC
  Administered 2013-12-24 – 2014-01-01 (×33): 2 g via ORAL
  Filled 2013-12-23 (×43): qty 20

## 2013-12-23 NOTE — H&P (Signed)
Name: Shelby Anderson MRN: [id number] DOB: October 01, 1956    ADMISSION DATE:  12/23/2013   PRIMARY SERVICE:  PCCM   CHIEF COMPLAINT:  Right sided chest pain, fever and cough   BRIEF PATIENT DESCRIPTION:  This is a 58 year old female f/b RA for asthma. Presented for sick visit 1/21 w/ fevers, pleuritic CP and associated dyspnea. CXR c/w PNA. Admitted for further eval and rx.   SIGNIFICANT EVENTS / STUDIES:    LINES / TUBES:   CULTURES: Sputum 1/21>>> U strep 1/21>>> U legionella 1/21>>> resp viral panel 1/21>>> Influenza PCR 1/21>>>   ANTIBIOTICS: levaquin 1/21>>> tamiflu 1/21>>>  HISTORY OF PRESENT ILLNESS:   This is a 58 year old female f/b Dr Elsworth Soho for asthma. Presented to the office on 1/21 for sick visit. Reported symptoms started ~ 1 week prior w/ non-productive cough. Did have several sick exposures in family (husband and grandchildren). Had been feeling sick w/ poor po intake, some dry heaves and cough progressing since 1/14. Started to have productive cough and some CP associated w/ cough on 1/15. Cough and right sided CP continued to worsen, developed fever 1/17 and 1/17. Presented to our office with severe 10/10 chest pain with deep breath or cough, persistent cough and weakness to the point she felt she was going to pass out. We sent her for direct admit. Initial CXR c/w PNA.   PAST MEDICAL HISTORY :  Past Medical History  Diagnosis Date  . Angina   . Heart murmur   . Asthma   . Mental disorder   . Coronary artery disease   . Hypertension   . Recurrent upper respiratory infection (URI)   . Tuberculosis     + TB SKIN TEST  . GERD (gastroesophageal reflux disease)   . Headache(784.0)   . Arthritis   . Anxiety   . Pneumonia   . Depression   . TMJ (dislocation of temporomandibular joint)   . Difficult intubation     "TMJ & woke up when they were still cutting on me"  . PONV (postoperative nausea and vomiting)   . Dysrhythmia   . Atrial fibrillation       h/o "AF w/frequent PVCs"  . Myocardial infarction 1980's & 1990;  . Shortness of breath 11/20/11    "all the time"  . Asthmatic bronchitis     "seasonal"  . Anemia     "chronic"  . H/O hiatal hernia   . Stomach ulcer     "3 small; found in 05/2011"  . History of migraines     "dx'd when I was in my teens"  . Fibromyalgia     "in my legs"  . Chronic back pain greater than 3 months duration    Past Surgical History  Procedure Laterality Date  . Carpal tunnel release  unknown    left hand  . Achilles tendon repair  1970's    left ankle  . Arthroscopic repair acl      left knee cap  . Cardiac catheterization      loop recorder  . Post ganglionectomy  1970's    "for migraine headaches"  . Tubal ligation  1980's  . Pouch string      "did this 3 times (once w/each pregnancy)"   Prior to Admission medications   Medication Sig Start Date End Date Taking? Authorizing Provider  acetaminophen (TYLENOL) 500 MG tablet Take 500 mg by mouth every 6 (six) hours as needed.    Historical Provider,  MD  albuterol (PROVENTIL HFA;VENTOLIN HFA) 108 (90 BASE) MCG/ACT inhaler Inhale 2 puffs into the lungs every 4 (four) hours as needed. Shortness of breath 08/20/13   Rigoberto Noel, MD  aspirin 81 MG EC tablet Take 81 mg by mouth daily. Swallow whole.     Historical Provider, MD  atorvastatin (LIPITOR) 40 MG tablet Take 40 mg by mouth daily.    Historical Provider, MD  Calcium Carbonate-Vitamin D (CALCIUM + D PO) Take 1 tablet by mouth 3 (three) times daily.      Historical Provider, MD  CARAFATE 1 GM/10ML suspension TAKE TWO TEASPOONSFUL (10ML) BY MOUTH THREE TIMES DAILY AFTER A MEAL 07/01/13   Sable Feil, MD  Cinnamon 500 MG capsule Take 1,000 mg by mouth 2 (two) times daily.     Historical Provider, MD  esomeprazole (NEXIUM) 40 MG capsule Take 40 mg by mouth 2 (two) times daily.      Historical Provider, MD  fish oil-omega-3 fatty acids 1000 MG capsule Take 1 g by mouth 2 (two) times  daily.      Historical Provider, MD  FLUDROCORTISONE ACETATE PO Take 0.1 mg by mouth 2 (two) times daily.    Historical Provider, MD  gabapentin (NEURONTIN) 300 MG capsule Take 300 mg by mouth 4 (four) times daily.     Historical Provider, MD  GLUCOSAMINE PO Take 1 tablet by mouth 2 (two) times daily.      Historical Provider, MD  ibuprofen (ADVIL,MOTRIN) 200 MG tablet Take 200 mg by mouth every 6 (six) hours as needed.    Historical Provider, MD  isosorbide mononitrate (IMDUR) 60 MG 24 hr tablet Take 60 mg by mouth daily.      Historical Provider, MD  L-Methylfolate (DEPLIN) 7.5 MG TABS Take 7.5 mg by mouth daily.      Historical Provider, MD  levothyroxine (SYNTHROID, LEVOTHROID) 25 MCG tablet Take 25 mcg by mouth daily before breakfast.    Historical Provider, MD  lidocaine (LIDODERM) 5 % Place 2 patches onto the skin as needed. Remove & Discard patch within 12 hours or as directed by MD    Historical Provider, MD  medroxyPROGESTERone (PROVERA) 10 MG tablet Please take 1 tablet daily for the first 10 days of each month May through July 03/25/13   Terrance Mass, MD  metoprolol (TOPROL-XL) 50 MG 24 hr tablet Take 25 mg by mouth 2 (two) times daily.     Historical Provider, MD  mometasone-formoterol (DULERA) 200-5 MCG/ACT AERO Inhale 2 puffs into the lungs 2 (two) times daily. 08/20/13   Rigoberto Noel, MD  montelukast (SINGULAIR) 10 MG tablet TAKE ONE TABLET BY MOUTH ONCE DAILY 07/31/13   Rigoberto Noel, MD  Multiple Vitamin (MULITIVITAMIN WITH MINERALS) TABS Take 1 tablet by mouth daily.      Historical Provider, MD  NONFORMULARY OR COMPOUNDED ITEM Estradiol .02% 1 ML Prefilled Applicator Sig: apply vaginally twice a week #90 Day Supply with 4 refills 03/10/13   Terrance Mass, MD  oxycodone (ROXICODONE) 30 MG immediate release tablet Take 30 mg by mouth 5 (five) times daily as needed.    Historical Provider, MD  promethazine (PHENERGAN) 25 MG tablet TAKE 1 TABLET (25 MG TOTAL) BY MOUTH AT BEDTIME  AS NEEDED. FOR NAUSEA 07/24/13   Sable Feil, MD  saccharomyces boulardii (FLORASTOR) 250 MG capsule Take 250 mg by mouth 2 (two) times daily as needed.    Historical Provider, MD   Allergies  Allergen Reactions  .  Oxycontin [Oxycodone Hcl] Anaphylaxis    hives, trouble breathing, tongue swelling (can't  tolerate oxycontin ) \  . Aspirin Other (See Comments)    GI Bleeds  . Darvocet [Propoxyphene N-Acetaminophen] Hives  . Lodine [Etodolac] Hives and Swelling  . Nitroglycerin     IV-BP drops  . Penicillins Other (See Comments)    unknown  . Ultram [Tramadol Hcl] Hives  . Valium Other (See Comments)    Circulation problems    FAMILY HISTORY:  Family History  Problem Relation Age of Onset  . Malignant hyperthermia Father   . Hypertension Father   . Heart disease Father   . Anesthesia problems Neg Hx   . Hypotension Neg Hx   . Pseudochol deficiency Neg Hx   . Hypertension Mother   . Heart disease Mother   . Cancer Sister     CERVICAL  . Hypertension Sister   . Cancer Brother     MELANOMA  . Heart disease Maternal Grandmother   . Heart disease Maternal Grandfather   . Cancer Paternal Grandmother     ?   Marland Kitchen Heart disease Paternal Grandmother   . Heart disease Paternal Grandfather   . Cancer Brother     LUNG  . Diabetes Sister   . Hypertension Sister   . Heart disease Sister    SOCIAL HISTORY:  reports that she has never smoked. She has never used smokeless tobacco. She reports that she does not drink alcohol or use illicit drugs.  REVIEW OF SYSTEMS bolds are positive :   Constitutional: Negative for fever, chills, weight loss, malaise/fatigue and diaphoresis.  HENT: Negative for hearing loss, ear pain, nosebleeds, congestion, sore throat, neck pain, tinnitus and ear discharge.   Eyes: Negative for blurred vision, double vision, photophobia, pain, discharge and redness.  Respiratory: Negative for cough productive green/grey sputum, hemoptysis,  shortness of breath  primarily w/ pain. Pleuritic in nature right chest wall  wheezing and stridor.   Cardiovascular: Negative for chest pain, palpitations, orthopnea, claudication, leg swelling and PND.  Gastrointestinal: Negative for heartburn, nausea, vomiting, abdominal pain, diarrhea, constipation, blood in stool and melena.  Genitourinary: Negative for dysuria, urgency, frequency, hematuria and flank pain.  Musculoskeletal: Negative for myalgias, back pain, joint pain and falls.  Skin: Negative for itching and rash.  Neurological: Negative for dizziness, tingling, tremors, sensory change, speech change, focal weakness, seizures, loss of consciousness, weakness and headaches.  Endo/Heme/Allergies: Negative for environmental allergies and polydipsia. Does not bruise/bleed easily.  SUBJECTIVE:  Marked right sided CP w/ deep breath  VITAL SIGNS: Temp:  [98 F (36.7 C)-98.2 F (36.8 C)] 98 F (36.7 C) (01/21 1256) Pulse Rate:  [74-87] 74 (01/21 1256) Resp:  [20] 20 (01/21 1256) BP: (118-128)/(76-79) 128/79 mmHg (01/21 1256) SpO2:  [94 %] 94 % (01/21 1256) Weight:  [99.111 kg (218 lb 8 oz)] 99.111 kg (218 lb 8 oz) (01/21 1256)  PHYSICAL EXAMINATION: General:  Acutely ill appearing 58 year old female in some distress w/ deep breathing  Neuro:  Awake, oriented, no focal def  HEENT:  [place], no JVD  Cardiovascular:  Rrr.  EKG w/ age undetermined inferior changes. And occ PVC Lungs:  Right > left rhonchi/rales  Abdomen:  Soft, no tenderness  Musculoskeletal:  Intact w/ no swelling  Skin:  diaphoretic   No results found for this basename: NA, K, CL, CO2, BUN, CREATININE, GLUCOSE,  in the last 168 hours No results found for this basename: HGB, HCT, WBC, PLT,  in the  last 168 hours Dg Chest Port 1 View  12/23/2013   CLINICAL DATA:  Chest pain and weakness.  EXAM: PORTABLE CHEST - 1 VIEW  COMPARISON:  PA and lateral chest 01/05/2013.  FINDINGS: There is airspace disease in the right upper lobe. Left lung is  clear. No pneumothorax or pleural fluid. Heart size is upper normal. Loop recorder is noted.  IMPRESSION: Right upper lobe airspace disease worrisome for pneumonia. Recommend followup films to clearing.   Electronically Signed   By: Inge Rise M.D.   On: 12/23/2013 13:08    ASSESSMENT / PLAN:  CAP. Currently involving primarily RUL. Worried about influenza w/ bacterial super-infection Right sided Pleuritic CP. In the setting of PNA  H/o asthma: not currently in bronchospasm  Plan Admit to tele Cycle CEs Get PCT, U-antigens, viral swab, sputum culture Place on resp isolation  Empiric tamiflu and levaquin PRN analgesia  Home BDs F/u CXR in am   H/o CAD w/ abnormal ECG: age undetermined inferior changes.  Plan Cycle CEs Cont home meds  GERD Plan ppi   Hypothyroidism  Plan Cont synthroid  Chronic pain Plan Cont home rx   Summary - Admitted directly from the office due to severe chest pain & distress, appears to be RUL pneumonia, high suspicion for influenza.  Rigoberto Noel  Pulmonary and [place] Pager: [phone]  12/23/2013, 1:35 PM

## 2013-12-23 NOTE — Assessment & Plan Note (Signed)
[place]

## 2013-12-23 NOTE — Progress Notes (Signed)
Pt refusing PO meds this evening d/t nausea and dry heaves. PO Phenergan given with no effect. IV Zofran given and pt has verbalized some relief but does not want to take any PO meds including PO KCL as she is afraid it will trigger nausea and dry heaves again. K+=3.1. Dr Nelda Marseille made aware of above. No new orders received.

## 2013-12-23 NOTE — Progress Notes (Signed)
Pt states she does not feel she is able to produce sputum at this time. Sputum cup was left with pt for when she is able to have a productive cough.

## 2013-12-23 NOTE — Progress Notes (Signed)
   Subjective:    Patient ID: Shelby Anderson, female    DOB: 12-03-1956, 58 y.o.   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  She was evaluated for CAD & has a loop recorder evaluation at [facility] for syncope.  EGD in 4/12 (Buccini) has shown peptic ulcer disease.  Spirometry did not show airway obstruction of asthma -No reversibility with bronchodilator  Her husband smokes but she denies any change in her environment. She has positive PPD.  CXR LLL nodular infx - resolved 5/13   12/23/2013  C/o fever , rt pleuritic severe cp, non radiating, diarrhea x 1 d Entire family has been sick Vomiting x2   Loop recorder  Compliant with [place] sleep test 2013 - showed desatn, but could not afford O2  She denies seasonal allergies, post nasal drip or breakthrough heartburn on nexium  Last Steroid taper 01/2013 - No ER or hospital visits since Uses SABA 1 x /week   Review of Systems neg for any significant sore throat, dysphagia, itching, sneezing, nasal congestion or excess/ purulent secretions, fever, chills, sweats, unintended wt loss, pleuritic or exertional cp, hempoptysis, orthopnea pnd or change in chronic leg swelling. Also denies presyncope, palpitations, heartburn,  nausea or change in bowel or urinary habits, dysuria,hematuria, rash, arthralgias, visual complaints, headache, numbness weakness or ataxia.     Objective:   Physical Exam  Gen. Pleasant, obese, in no distress ENT - no lesions, no post nasal drip Neck: No JVD, no thyromegaly, no carotid bruits Lungs: no use of accessory muscles, no dullness to percussion, decreased without rales or rhonchi  Cardiovascular: Rhythm regular, heart sounds  normal, no murmurs or gallops, no peripheral edema Musculoskeletal: No deformities, no cyanosis or  clubbing , no tremors       Assessment & Plan:

## 2013-12-23 NOTE — Progress Notes (Signed)
eLink Physician-Brief Progress Note Patient Name: TREA CARNEGIE DOB: 17-Feb-1956 MRN: [id number]  Date of Service  12/23/2013   HPI/Events of Note   N/V  eICU Interventions  Zofran ordered.      YACOUB,WESAM 12/23/2013, 5:26 PM

## 2013-12-23 NOTE — Progress Notes (Signed)
Report from [place], [place] on [place].

## 2013-12-24 ENCOUNTER — Inpatient Hospital Stay (HOSPITAL_COMMUNITY): Payer: Medicare Other

## 2013-12-24 DIAGNOSIS — J209 Acute bronchitis, unspecified: Secondary | ICD-10-CM

## 2013-12-24 DIAGNOSIS — J45901 Unspecified asthma with (acute) exacerbation: Secondary | ICD-10-CM

## 2013-12-24 DIAGNOSIS — K219 Gastro-esophageal reflux disease without esophagitis: Secondary | ICD-10-CM

## 2013-12-24 LAB — RESPIRATORY VIRUS PANEL
Adenovirus: NOT DETECTED
INFLUENZA A H1: NOT DETECTED
INFLUENZA A H3: NOT DETECTED
INFLUENZA B 1: NOT DETECTED
Influenza A: NOT DETECTED
Metapneumovirus: NOT DETECTED
PARAINFLUENZA 1 A: NOT DETECTED
Parainfluenza 2: NOT DETECTED
Parainfluenza 3: NOT DETECTED
RESPIRATORY SYNCYTIAL VIRUS A: DETECTED — AB
Respiratory Syncytial Virus B: NOT DETECTED
Rhinovirus: NOT DETECTED

## 2013-12-24 LAB — CBC
HEMATOCRIT: 42.7 % (ref 36.0–46.0)
HEMOGLOBIN: 14.1 g/dL (ref 12.0–15.0)
MCH: 29.1 pg (ref 26.0–34.0)
MCHC: 33 g/dL (ref 30.0–36.0)
MCV: 88 fL (ref 78.0–100.0)
Platelets: 353 10*3/uL (ref 150–400)
RBC: 4.85 MIL/uL (ref 3.87–5.11)
RDW: 13.3 % (ref 11.5–15.5)
WBC: 9.4 10*3/uL (ref 4.0–10.5)

## 2013-12-24 LAB — COMPREHENSIVE METABOLIC PANEL
ALBUMIN: 2.9 g/dL — AB (ref 3.5–5.2)
ALT: 25 U/L (ref 0–35)
AST: 34 U/L (ref 0–37)
Alkaline Phosphatase: 83 U/L (ref 39–117)
BUN: 9 mg/dL (ref 6–23)
CALCIUM: 9.3 mg/dL (ref 8.4–10.5)
CO2: 30 mEq/L (ref 19–32)
CREATININE: 0.51 mg/dL (ref 0.50–1.10)
Chloride: 97 mEq/L (ref 96–112)
GFR calc Af Amer: >90 mL/min (ref >90)
GFR calc non Af Amer: >90 mL/min (ref >90)
Glucose, Bld: 121 mg/dL — ABNORMAL HIGH (ref 70–99)
Potassium: 3.4 mEq/L — ABNORMAL LOW (ref 3.7–5.3)
Sodium: 141 mEq/L (ref 137–147)
TOTAL PROTEIN: 7.9 g/dL (ref 6.0–8.3)
Total Bilirubin: 0.5 mg/dL (ref 0.3–1.2)

## 2013-12-24 LAB — LEGIONELLA ANTIGEN, URINE: Legionella Antigen, Urine: NEGATIVE

## 2013-12-24 LAB — URINE CULTURE
COLONY COUNT: NO GROWTH
CULTURE: NO GROWTH

## 2013-12-24 LAB — EXPECTORATED SPUTUM ASSESSMENT W REFEX TO RESP CULTURE

## 2013-12-24 LAB — EXPECTORATED SPUTUM ASSESSMENT W GRAM STAIN, RFLX TO RESP C

## 2013-12-24 LAB — INFLUENZA PANEL BY PCR (TYPE A & B)
H1N1 flu by pcr: NOT DETECTED
INFLAPCR: NEGATIVE
INFLBPCR: NEGATIVE

## 2013-12-24 LAB — TROPONIN I: Troponin I: <0.3 ng/mL (ref <0.30)

## 2013-12-24 MED ORDER — PANTOPRAZOLE SODIUM 40 MG PO TBEC
80.0000 mg | DELAYED_RELEASE_TABLET | Freq: Two times a day (BID) | ORAL | Status: DC
  Administered 2013-12-24 – 2014-01-01 (×16): 80 mg via ORAL
  Filled 2013-12-24 (×19): qty 2

## 2013-12-24 MED ORDER — METOCLOPRAMIDE HCL 5 MG/ML IJ SOLN
5.0000 mg | Freq: Three times a day (TID) | INTRAMUSCULAR | Status: DC
  Administered 2013-12-24 – 2014-01-01 (×32): 5 mg via INTRAVENOUS
  Filled 2013-12-24 (×21): qty 1
  Filled 2013-12-24: qty 2
  Filled 2013-12-24 (×3): qty 1
  Filled 2013-12-24 (×2): qty 2
  Filled 2013-12-24 (×9): qty 1

## 2013-12-24 MED ORDER — PROMETHAZINE-CODEINE 6.25-10 MG/5ML PO SYRP
5.0000 mL | ORAL_SOLUTION | ORAL | Status: DC | PRN
  Administered 2013-12-24 – 2014-01-01 (×41): 5 mL via ORAL
  Filled 2013-12-24 (×42): qty 5

## 2013-12-24 MED ORDER — POTASSIUM CHLORIDE 20 MEQ/15ML (10%) PO LIQD
40.0000 meq | Freq: Once | ORAL | Status: DC
  Filled 2013-12-24: qty 30

## 2013-12-24 MED ORDER — FAMOTIDINE 40 MG PO TABS
40.0000 mg | ORAL_TABLET | Freq: Every day | ORAL | Status: DC
  Administered 2013-12-24 – 2013-12-31 (×8): 40 mg via ORAL
  Filled 2013-12-24 (×10): qty 1

## 2013-12-24 NOTE — Progress Notes (Signed)
Name: Shelby Anderson MRN: [id number] DOB: Jun 19, 1956    ADMISSION DATE:  12/23/2013   PRIMARY SERVICE:  PCCM   CHIEF COMPLAINT:  Right sided chest pain, fever and cough   BRIEF PATIENT DESCRIPTION:  This is a 58 year old female f/b RA for asthma. Presented for sick visit 1/21 w/ fevers, pleuritic CP and associated dyspnea. CXR c/w PNA. Admitted for further eval and rx.   SIGNIFICANT EVENTS / STUDIES:    LINES / TUBES:   CULTURES: Sputum 1/21>>> U strep 1/21>>>neg U legionella 1/21>>> resp viral panel 1/21>>> Influenza PCR 1/21>>>neg  ANTIBIOTICS: levaquin 1/21>>> tamiflu 1/21>>>1/22  SUBJECTIVE:  Chest discomfort is better but reflux and nausea was severe last night AND cough persists VITAL SIGNS: Temp:  [98 F (36.7 C)-99 F (37.2 C)] 98.6 F (37 C) (01/22 0445) Pulse Rate:  [73-87] 76 (01/22 0830) Resp:  [16-20] 16 (01/22 0445) BP: (118-136)/(76-98) 134/98 mmHg (01/22 0445) SpO2:  [91 %-97 %] 91 % (01/22 0929) Weight:  [99.111 kg (218 lb 8 oz)-100.9 kg (222 lb 7.1 oz)] 100.9 kg (222 lb 7.1 oz) (01/22 0445) Room air  PHYSICAL EXAMINATION: General: 58 year old female no longer acutely in distress but still has significant cough  Neuro:  Awake, oriented, no focal def  HEENT:  [place], no JVD, Increased upper airway wheezing  Lungs:  Right > left wheeze and UE rales  Abdomen:  Soft, no tenderness  Musculoskeletal:  Intact w/ no swelling  Skin:  diaphoretic    Recent Labs Lab 12/23/13 1235 12/24/13 0100  NA 146 141  K 3.1* 3.4*  CL 100 97  CO2 32 30  BUN 11 9  CREATININE 0.58 0.51  GLUCOSE 146* 121*    Recent Labs Lab 12/23/13 1235 12/24/13 0100  HGB 13.8 14.1  HCT 42.1 42.7  WBC 11.3* 9.4  PLT 320 353    Recent Labs Lab 12/23/13 1235 12/23/13 1833 12/24/13 0100  TROPONINI <0.30 <0.30 <0.30    Dg Chest Port 1 View  12/24/2013   CLINICAL DATA:  Pneumonia.  EXAM: PORTABLE CHEST - 1 VIEW  COMPARISON:  One-view chest 12/23/2013.   FINDINGS: The heart is mildly enlarged. May cardiac monitoring devices in place. Mild pulmonary vascular congestion is noted. Persistent right upper lobe airspace disease is noted  IMPRESSION: 1. Right upper lobe pneumonia. 2. Stable mild pulmonary vascular congestion.   Electronically Signed   By: Lawrence Santiago M.D.   On: 12/24/2013 07:46   Dg Chest Port 1 View  12/23/2013   CLINICAL DATA:  Chest pain and weakness.  EXAM: PORTABLE CHEST - 1 VIEW  COMPARISON:  PA and lateral chest 01/05/2013.  FINDINGS: There is airspace disease in the right upper lobe. Left lung is clear. No pneumothorax or pleural fluid. Heart size is upper normal. Loop recorder is noted.  IMPRESSION: Right upper lobe airspace disease worrisome for pneumonia. Recommend followup films to clearing.   Electronically Signed   By: Inge Rise M.D.   On: 12/23/2013 13:08  improved aeration   ASSESSMENT / PLAN:  CAP. Currently involving primarily RUL. Worried about influenza w/ bacterial super-infection.  Aeration a little better. Alternative explanation  for PNA could be reflux/aspiration  Right sided Pleuritic CP. In the setting of PNA. this has improved.   H/o asthma: not currently in bronchospasm  VCD: She has had significant reflux overnight and continues to cough. Her upper airway exam is very impressive with marked pseudo-wheeze.  Plan D/c resp isolation  Cont Empiric levaquin, d/c tamiflu  PRN analgesia  Home BDs Escalate GERD/reflux rx Will add some codeine/phenergan syrup to help limit cough  H/o CAD w/ abnormal ECG: age undetermined inferior changes.  CEs negative  Plan D/c tele Cont home meds  GERD/ nausea/vomiting  She has been taking nexium 80 mg/ twice a day at home. Think that the recent URI/PNA has exacerbated her GERD.  Plan Reflux precautions Escalate protonix to 80mg  bid (closer dosing  to the nexium) Add HS pepcid Cough suppression to help decrease it's effect on reflux Add short course of  reglan AC & HS.  Might need GI eval will see how she looks 1/23  Hypothyroidism  Plan Cont synthroid  Chronic pain Plan Cont home rx     12/24/2013, 11:04 AM

## 2013-12-24 NOTE — Progress Notes (Signed)
Attending:  I have seen and examined the patient with nurse practitioner/resident and agree with the note above.   Flu swab negative Perhaps slightly better today  Lots of upper airway irritation/cough  Barium swallow tomorrow.  Jillyn Hidden PCCM Pager: [phone] Cell: [phone] If no response, call [phone]

## 2013-12-24 NOTE — Progress Notes (Signed)
Droplet precautions reinitiated at this time per Infection Prevention request. Will remain on droplet precautions until resp antigen swab results received. Pt made aware.

## 2013-12-25 ENCOUNTER — Inpatient Hospital Stay (HOSPITAL_COMMUNITY): Payer: Medicare Other

## 2013-12-25 DIAGNOSIS — R110 Nausea: Secondary | ICD-10-CM

## 2013-12-25 LAB — BASIC METABOLIC PANEL
BUN: 6 mg/dL (ref 6–23)
CHLORIDE: 102 meq/L (ref 96–112)
CO2: 26 meq/L (ref 19–32)
CREATININE: 0.56 mg/dL (ref 0.50–1.10)
Calcium: 8.5 mg/dL (ref 8.4–10.5)
GFR calc non Af Amer: >90 mL/min (ref >90)
Glucose, Bld: 102 mg/dL — ABNORMAL HIGH (ref 70–99)
POTASSIUM: 3.1 meq/L — AB (ref 3.7–5.3)
Sodium: 141 mEq/L (ref 137–147)

## 2013-12-25 LAB — PROCALCITONIN

## 2013-12-25 NOTE — Progress Notes (Signed)
Name: Shelby Anderson MRN: [id number] DOB: May 20, 1956    ADMISSION DATE:  12/23/2013   PRIMARY SERVICE:  PCCM   CHIEF COMPLAINT:  Right sided chest pain, fever and cough   BRIEF PATIENT DESCRIPTION:  This is a 58 year old female f/b RA for asthma. Presented for sick visit 1/21 w/ fevers, pleuritic CP and associated dyspnea. CXR c/w PNA. Admitted for further eval and rx.   SIGNIFICANT EVENTS / STUDIES:  1/23 Barium swallow >> prelim (narrowing esophagus, pill wouldn't pass)  LINES / TUBES:   CULTURES: Sputum 1/21>>> U strep 1/21>>>neg U legionella 1/21>>> resp viral panel 1/21>>> RSV Influenza PCR 1/21>>>neg  ANTIBIOTICS: levaquin 1/21>>> tamiflu 1/21>>>1/22  SUBJECTIVE:  Nausea post barium swallow today, cough persists, hasn't taken PO, nausea today  VITAL SIGNS: Temp:  [98.1 F (36.7 C)-98.7 F (37.1 C)] 98.7 F (37.1 C) (01/23 0442) Pulse Rate:  [71-81] 71 (01/23 0442) Resp:  [18-20] 18 (01/23 0442) BP: (113-145)/(56-76) 145/76 mmHg (01/23 0442) SpO2:  [95 %-97 %] 97 % (01/23 0945) Weight:  [101.1 kg (222 lb 14.2 oz)] 101.1 kg (222 lb 14.2 oz) (01/23 0442) Room air  PHYSICAL EXAMINATION: General: mild distress in bed  HEENT: NCAT, EOMi PULM: Upper airway wheezing CV:RRR, no mgr Ab: BS+,soft,nontender Ext: warm, no edema   Recent Labs Lab 12/23/13 1235 12/24/13 0100 12/25/13 0437  NA 146 141 141  K 3.1* 3.4* 3.1*  CL 100 97 102  CO2 32 30 26  BUN 11 9 6   CREATININE 0.58 0.51 0.56  GLUCOSE 146* 121* 102*    Recent Labs Lab 12/23/13 1235 12/24/13 0100  HGB 13.8 14.1  HCT 42.1 42.7  WBC 11.3* 9.4  PLT 320 353    Recent Labs Lab 12/23/13 1235 12/23/13 1833 12/24/13 0100  TROPONINI <0.30 <0.30 <0.30    Dg Chest Port 1 View  12/24/2013   CLINICAL DATA:  Pneumonia.  EXAM: PORTABLE CHEST - 1 VIEW  COMPARISON:  One-view chest 12/23/2013.  FINDINGS: The heart is mildly enlarged. May cardiac monitoring devices in place. Mild  pulmonary vascular congestion is noted. Persistent right upper lobe airspace disease is noted  IMPRESSION: 1. Right upper lobe pneumonia. 2. Stable mild pulmonary vascular congestion.   Electronically Signed   By: Lawrence Santiago M.D.   On: 12/24/2013 07:46   Dg Chest Port 1 View  12/23/2013   CLINICAL DATA:  Chest pain and weakness.  EXAM: PORTABLE CHEST - 1 VIEW  COMPARISON:  PA and lateral chest 01/05/2013.  FINDINGS: There is airspace disease in the right upper lobe. Left lung is clear. No pneumothorax or pleural fluid. Heart size is upper normal. Loop recorder is noted.  IMPRESSION: Right upper lobe airspace disease worrisome for pneumonia. Recommend followup films to clearing.   Electronically Signed   By: Inge Rise M.D.   On: 12/23/2013 13:08  improved aeration   ASSESSMENT / PLAN:  CAP. Currently involving primarily RUL. RSV bronchitis with possible bacterial superinfection.  Aeration a little better. Alternative explanation  for PNA could be reflux/aspiration given GI issues Right sided Pleuritic CP. In the setting of PNA. this has improved.   H/o asthma: not currently in bronchospasm  VCD: She has had significant reflux overnight and continues to cough. Her upper airway exam is very impressive with marked pseudo-wheeze.  Plan Cont resp isolation for RSV Instructed to avoid children with RSV Cont Empiric levaquin PRN analgesia  Home BDs Continue high GERD/reflux rx codeine/phenergan syrup to help limit cough  H/o CAD w/ abnormal ECG: age undetermined inferior changes.  CEs negative  Plan D/c tele Cont home meds  GERD/ nausea/vomiting  She has been taking nexium 80 mg/ twice a day at home. Think that the recent URI/PNA has exacerbated her GERD.  Prelim read on Barium swallow showed narrowing and pill wouldn't pass Plan Reflux precautions protonix 80mg  bid  HS pepcid Cough suppression to help decrease it's effect on reflux Add short course of reglan AC & HS.  Might  need GI eval depending on result of barium swallow  Hypothyroidism  Plan Cont synthroid  Chronic pain Plan Cont home rx   Jillyn Hidden PCCM Pager: [phone] Cell: [phone] If no response, call [phone]   12/25/2013, 12:42 PM

## 2013-12-25 NOTE — Progress Notes (Signed)
Per Infection Control Pt to be taken off Droplet precautions Flu PCR negative. Pt with Positive RSV in Resp Panel and will be on Contact Precautions per Infection Control

## 2013-12-26 DIAGNOSIS — G4733 Obstructive sleep apnea (adult) (pediatric): Secondary | ICD-10-CM

## 2013-12-26 LAB — BASIC METABOLIC PANEL
BUN: 4 mg/dL — ABNORMAL LOW (ref 6–23)
CALCIUM: 8.5 mg/dL (ref 8.4–10.5)
CO2: 31 meq/L (ref 19–32)
Chloride: 102 mEq/L (ref 96–112)
Creatinine, Ser: 0.6 mg/dL (ref 0.50–1.10)
GFR calc Af Amer: >90 mL/min (ref >90)
GFR calc non Af Amer: >90 mL/min (ref >90)
GLUCOSE: 96 mg/dL (ref 70–99)
Potassium: 2.7 mEq/L — CL (ref 3.7–5.3)
Sodium: 144 mEq/L (ref 137–147)

## 2013-12-26 LAB — CULTURE, RESPIRATORY: CULTURE: NORMAL

## 2013-12-26 LAB — CULTURE, RESPIRATORY W GRAM STAIN

## 2013-12-26 NOTE — Consult Note (Signed)
[facility] Consult: 1:16 PM 12/27/2013  LOS: 4 days    Referring Provider:  Dr Lenna Gilford  Primary Care Physician:  Delia Chimes, NP Primary Gastroenterologist: previous pt of Dr Cristina Gong.  Dr Sharlett Iles saw her in 04/2013 for first time.    Reason for Consultation:  Abnormal esophagram with distal esophageal stricture.Marland Kitchen    HPI: Shelby Anderson is a 58 y.o. female.  Retired Therapist, sports. Chronic pain syndrome, chronic headaches and spinal pain managed with narcotics. Has seen many specialists for various complaints:  Arrhythmias, abdominal pain, nausea. Labelled as having severe depression.  Requires small volume meals due to hx of gastric emptying problems.  Apparently Reglan caused hepatitis in the past.  Hx recurrent PUD, despite PPI (Nexium). Colon and EGD in 2012 by Dr Cristina Gong,   04/2013 EGD by Dr Sharlett Iles: mild antral gastropathy, grade A esophagitis. Pathology negative for celiac disease, H Pylori, and Barrett's 04/2013 Ultrasound:  Biliary sludge, 8 mm CBD, mild fatty liver  04/2013 Gastric emptying study: 9% residual at 2 hours:  Normal study.   Has not been back to see Dr Sharlett Iles since 04/2013. Takes Nexium 80 mg BID.   Admitted 3 days ago with community acquired pneumonia in an asthmatic. Treating with Levaquin and Tamiflu.   Barium esophagram on 12/25/12. This showed distal esophageal stricture, 13 mm tablet did not pass. She has at least one  Year of dysphagia, interestingly more trouble swallowing liquids than solids.  Occasionally regurgitated liquids.      Past Medical History  Diagnosis Date  . Angina   . Heart murmur   . Asthma   . Mental disorder   . Coronary artery disease   . Hypertension   . Recurrent upper respiratory infection (URI)   . Tuberculosis     + TB SKIN TEST  . GERD  (gastroesophageal reflux disease)   . Headache(784.0)   . Arthritis   . Anxiety   . Pneumonia   . Depression   . TMJ (dislocation of temporomandibular joint)   . Difficult intubation     "TMJ & woke up when they were still cutting on me"  . PONV (postoperative nausea and vomiting)   . Dysrhythmia   . Atrial fibrillation     h/o "AF w/frequent PVCs"  . Myocardial infarction 1980's & 1990;  . Shortness of breath 11/20/11    "all the time"  . Asthmatic bronchitis     "seasonal"  . Anemia     "chronic"  . H/O hiatal hernia   . Stomach ulcer     "3 small; found in 05/2011"  . History of migraines     "dx'd when I was in my teens"  . Fibromyalgia     "in my legs"  . Chronic back pain greater than 3 months duration     Past Surgical History  Procedure Laterality Date  . Carpal tunnel release  unknown    left hand  . Achilles tendon repair  1970's    left ankle  . Arthroscopic repair acl      left  knee cap  . Cardiac catheterization      loop recorder  . Post ganglionectomy  1970's    "for migraine headaches"  . Tubal ligation  1980's  . Pouch string      "did this 3 times (once w/each pregnancy)"    Prior to Admission medications   Medication Sig Start Date End Date Taking? Authorizing Provider  acetaminophen (TYLENOL) 500 MG tablet Take 500 mg by mouth every 6 (six) hours as needed for mild pain.   Yes Historical Provider, MD  albuterol (PROVENTIL HFA;VENTOLIN HFA) 108 (90 BASE) MCG/ACT inhaler Inhale 2 puffs into the lungs every 4 (four) hours as needed. Shortness of breath 08/20/13  Yes Rigoberto Noel, MD  aspirin 81 MG EC tablet Take 81 mg by mouth daily. Swallow whole.    Yes Historical Provider, MD  atorvastatin (LIPITOR) 40 MG tablet Take 40 mg by mouth daily.   Yes Historical Provider, MD  Calcium Carbonate-Vitamin D (CALCIUM + D PO) Take 1 tablet by mouth 3 (three) times daily.     Yes Historical Provider, MD  Cinnamon 500 MG capsule Take 1,000 mg by mouth 2 (two)  times daily.    Yes Historical Provider, MD  esomeprazole (NEXIUM) 40 MG capsule Take 40 mg by mouth 2 (two) times daily.     Yes Historical Provider, MD  fish oil-omega-3 fatty acids 1000 MG capsule Take 1 g by mouth 2 (two) times daily.     Yes Historical Provider, MD  FLUDROCORTISONE ACETATE PO Take 0.1 mg by mouth 2 (two) times daily.   Yes Historical Provider, MD  gabapentin (NEURONTIN) 300 MG capsule Take 300 mg by mouth 4 (four) times daily.    Yes Historical Provider, MD  GLUCOSAMINE PO Take 1 tablet by mouth 2 (two) times daily.     Yes Historical Provider, MD  ibuprofen (ADVIL,MOTRIN) 200 MG tablet Take 200 mg by mouth every 6 (six) hours as needed.   Yes Historical Provider, MD  isosorbide mononitrate (IMDUR) 60 MG 24 hr tablet Take 60 mg by mouth daily.     Yes Historical Provider, MD  L-Methylfolate (DEPLIN) 7.5 MG TABS Take 7.5 mg by mouth daily.     Yes Historical Provider, MD  lidocaine (LIDODERM) 5 % Place 2 patches onto the skin as needed. Remove & Discard patch within 12 hours or as directed by MD   Yes Historical Provider, MD  metoprolol succinate (TOPROL-XL) 50 MG 24 hr tablet Take 50 mg by mouth 2 (two) times daily. Take with or immediately following a meal.   Yes Historical Provider, MD  mometasone-formoterol (DULERA) 200-5 MCG/ACT AERO Inhale 2 puffs into the lungs 2 (two) times daily. 08/20/13  Yes Rigoberto Noel, MD  montelukast (SINGULAIR) 10 MG tablet Take 10 mg by mouth at bedtime.   Yes Historical Provider, MD  Multiple Vitamin (MULITIVITAMIN WITH MINERALS) TABS Take 1 tablet by mouth daily.     Yes Historical Provider, MD  NONFORMULARY OR COMPOUNDED ITEM Estradiol .02% 1 ML Prefilled Applicator Sig: apply vaginally twice a week #90 Day Supply with 4 refills 03/10/13  Yes Terrance Mass, MD  Oxycodone HCl 20 MG TABS Take 20 mg by mouth every 4 (four) hours as needed (pain.).   Yes Historical Provider, MD  promethazine (PHENERGAN) 25 MG tablet TAKE 1 TABLET (25 MG TOTAL)  BY MOUTH AT BEDTIME AS NEEDED. FOR NAUSEA 07/24/13  Yes Sable Feil, MD  promethazine (PHENERGAN) 25 MG tablet Take 25 mg  by mouth at bedtime as needed for nausea or vomiting.   Yes Historical Provider, MD  sucralfate (CARAFATE) 1 GM/10ML suspension Take 1 g by mouth 4 (four) times daily -  with meals and at bedtime.   Yes Historical Provider, MD  zolpidem (AMBIEN) 5 MG tablet Take 5 mg by mouth at bedtime.    Yes Historical Provider, MD    Scheduled Meds: . aspirin EC  81 mg Oral Daily  . atorvastatin  40 mg Oral q1800  . enoxaparin (LOVENOX) injection  40 mg Subcutaneous Q24H  . famotidine  40 mg Oral QHS  . fludrocortisone  0.1 mg Oral BID  . gabapentin  300 mg Oral QID  . isosorbide mononitrate  60 mg Oral Daily  . l-methylfolate-B6-B12  1 tablet Oral Daily  . levofloxacin (LEVAQUIN) IV  750 mg Intravenous Q24H  . levothyroxine  25 mcg Oral QAC breakfast  . metoCLOPramide (REGLAN) injection  5 mg Intravenous TID AC & HS  . metoprolol succinate  50 mg Oral BID  . mometasone-formoterol  2 puff Inhalation BID  . montelukast  10 mg Oral QHS  . multivitamin with minerals  1 tablet Oral Daily  . pantoprazole  80 mg Oral BID AC  . potassium chloride  20 mEq Oral Q3H  . sucralfate  2 g Oral TID WC & HS   Infusions: . sodium chloride 75 mL/hr at 12/25/13 2256   PRN Meds: lidocaine, morphine injection, ondansetron (ZOFRAN) IV, oxycodone, promethazine-codeine   Allergies as of 12/23/2013 - Review Complete 12/23/2013  Allergen Reaction Noted  . Oxycontin [oxycodone hcl] Anaphylaxis 11/21/2011  . Aspirin Other (See Comments) 11/20/2011  . Darvocet [propoxyphene n-acetaminophen] Hives 11/20/2011  . Lodine [etodolac] Hives and Swelling 11/20/2011  . Nitroglycerin  12/19/2011  . Penicillins Other (See Comments) 11/20/2011  . Ultram [tramadol hcl] Hives 11/20/2011  . Valium Other (See Comments) 11/20/2011    Family History  Problem Relation Age of Onset  . Malignant  hyperthermia Father   . Hypertension Father   . Heart disease Father   . Anesthesia problems Neg Hx   . Hypotension Neg Hx   . Pseudochol deficiency Neg Hx   . Hypertension Mother   . Heart disease Mother   . Cancer Sister     CERVICAL  . Hypertension Sister   . Cancer Brother     MELANOMA  . Heart disease Maternal Grandmother   . Heart disease Maternal Grandfather   . Cancer Paternal Grandmother     ?   Marland Kitchen Heart disease Paternal Grandmother   . Heart disease Paternal Grandfather   . Cancer Brother     LUNG  . Diabetes Sister   . Hypertension Sister   . Heart disease Sister     History   Social History  . Marital Status: Married    Spouse Name: N/A    Number of Children: 3  . Years of Education: N/A   Occupational History  . Retired Therapist, sports    Social History Main Topics  . Smoking status: Never Smoker   . Smokeless tobacco: Never Used  . Alcohol Use: No  . Drug Use: No  . Sexual Activity: Yes    Birth Control/ Protection: None   Other Topics Concern  . Not on file   Social History Narrative  . No narrative on file    REVIEW OF SYSTEMS: Constitutional:  Stable weight ENT:  No nose bleeds Pulm:  Cough, wheezing,DOE CV:  No palpitations, no LE edema.  GU:  No hematuria, no frequency GI:  Per HPI.  BMs brown and regular Heme:  No hx of anemia   Transfusions:  none Neuro:  No headaches, no peripheral tingling or numbness Derm:  No itching, no rash or sores.  Endocrine:  No sweats or chills.  No polyuria or dysuria Immunization:  Flu shot in Sept Travel:  None beyond local counties in last few months.    PHYSICAL EXAM: Vital signs in last 24 hours: Filed Vitals:   12/27/13 1304  BP: 107/56  Pulse: 72  Temp: 98 F (36.7 C)  Resp: 16   Wt Readings from Last 3 Encounters:  12/27/13 99.3 kg (218 lb 14.7 oz)  08/20/13 103.42 kg (228 lb)  04/29/13 102.513 kg (226 lb)   General: obese WF who is hoarse and comfortable Head:  No swelling or asymmetry    Eyes:  No icterus or pallor Ears:  Not HOH  Nose:  + congestion Mouth:  Clear, moist, fair dentition.   Neck:  No JVD or bruits.  No TMG Lungs:  wheezing in upper airway.  +loose cough. hoarse Heart: RRR.  No MRG Abdomen:  Soft, NT, ND.  No mass or HSM.   Rectal: deferred   Musc/Skeltl: no joint deformity or swelling Extremities:  No CCE  Neurologic:  No tremor, no limb weakness.  orientedx 3.  Skin:  No rash, no telangectasia Tattoos:  none Nodes:  No cervical adenopathy   Psych:  Cooperative, relaxed,  Intake/Output from previous day: 01/24 0701 - 01/25 0700 In: 12 [P.O.:1800; I.V.:2100] Out: 1675 [Urine:1675] Intake/Output this shift: Total I/O In: 240 [P.O.:240] Out: 1050 [Urine:1050]  LAB RESULTS: No results found for this basename: WBC, HGB, HCT, PLT,  in the last 72 hours BMET Lab Results  Component Value Date   NA 144 12/26/2013   NA 141 12/25/2013   NA 141 12/24/2013   K 2.7* 12/26/2013   K 3.1* 12/25/2013   K 3.4* 12/24/2013   CL 102 12/26/2013   CL 102 12/25/2013   CL 97 12/24/2013   CO2 31 12/26/2013   CO2 26 12/25/2013   CO2 30 12/24/2013   GLUCOSE 96 12/26/2013   GLUCOSE 102* 12/25/2013   GLUCOSE 121* 12/24/2013   BUN 4* 12/26/2013   BUN 6 12/25/2013   BUN 9 12/24/2013   CREATININE 0.60 12/26/2013   CREATININE 0.56 12/25/2013   CREATININE 0.51 12/24/2013   CALCIUM 8.5 12/26/2013   CALCIUM 8.5 12/25/2013   CALCIUM 9.3 12/24/2013   LFT No results found for this basename: PROT, ALBUMIN, AST, ALT, ALKPHOS, BILITOT, BILIDIR, IBILI,  in the last 72 hours Lipase     Component Value Date/Time   LIPASE 23 12/23/2013 1235    RADIOLOGY STUDIES: Dg Esophagus 12/25/2013  FINDINGS: The oropharyngeal swallowing mechanisms are normal. There is small sliding hiatal hernia with a stricture at the gastroesophageal junction. There occasional tertiary contractions in the distal esophagus. The patient ingested a 13 mm barium tablet any did not pass through the area of stricture at  the GE junction despite risk repeated swallows of thick barium, water, and Valsalva maneuver.  IMPRESSION: Distal esophageal stricture just above a small sliding hiatal hernia.   Electronically Signed   By: Rozetta Nunnery M.D.   On: 12/25/2013 15:29    ENDOSCOPIC STUDIES: Per HPI  IMPRESSION:   *  Esophageal stricture per barium esophagram. Over one year hx of dysphagia.   *  GERD, on supramax doses of PPI, so surprising  she would have developed an esophageal stricture.  * CAP  *  Hypokalemia. Oral potassium ordered by Dr Lenna Gilford.     PLAN:     *  Plan EGD as outpt after recovery from resp illness.  Dr Perry's office will be in touch with pt for office visit.    Azucena Freed  12/27/2013, 1:16 PM Pager: [phone]  GI ATTENDING  History, laboratories, x-rays, and prior endoscopy reports reviewed. Patient personally seen and examined. Agree with history and physical as outlined above. The patient is asthmatic and was admitted to the hospital with community-acquired pneumonia. Reported problems with dysphagia to liquids and solids. Barium esophagram revealed benign distal stricture with impingement of barium tablet passed. History reveals chronic problems with intermittent dysphagia. Not particularly worse now. She did not have esophageal dilation previously. Had colonoscopy and upper endoscopy with Dr. Cristina Gong in 2012. Upper endoscopy with Dr. Sharlett Iles last May. Recommend the patient had complete recovery from her pneumonia. I would like to see her as an outpatient in about 4 weeks (my nurse will arrange this appointment). At that time we can set up elective EGD with esophageal dilation. This should help with intermittent solid food dysphagia. Liquid dysphagia is likely due to dysmotility and would not respond to endoscopic or medical therapy. She should however continue on PPI for GERD. Discussed with the patient and her husband. Will sign off. Thank you  Docia Chuck. Geri Seminole., M.D. [facility] Division of Gastroenterology

## 2013-12-26 NOTE — Progress Notes (Signed)
Name: Shelby Anderson MRN: [id number] DOB: Apr 19, 1956    ADMISSION DATE:  12/23/2013   PRIMARY SERVICE:  PCCM   CHIEF COMPLAINT:  Right sided chest pain, fever and cough   BRIEF PATIENT DESCRIPTION:  This is a 58 year old female f/b RA for asthma. Presented for sick visit 1/21 w/ fevers, pleuritic CP and associated dyspnea. CXR c/w PNA. Admitted for further eval and rx.   SIGNIFICANT EVENTS / STUDIES:  1/23 Barium swallow >> prelim (narrowing esophagus, pill wouldn't pass)  LINES / TUBES:   CULTURES: Sputum 1/21>>> U strep 1/21>>>neg U legionella 1/21>>> resp viral panel 1/21>>> RSV Influenza PCR 1/21>>>neg  ANTIBIOTICS: levaquin 1/21>>> tamiflu 1/21>>>1/22  SUBJECTIVE:  Nausea post barium swallow today, cough persists, hasn't taken PO, nausea today  VITAL SIGNS: Temp:  [98 F (36.7 C)-98.4 F (36.9 C)] 98.2 F (36.8 C) (01/24 0505) Pulse Rate:  [61-76] 71 (01/24 0505) Resp:  [18-20] 20 (01/24 0505) BP: (120-155)/(57-98) 129/98 mmHg (01/24 0505) SpO2:  [92 %-98 %] 92 % (01/24 0505) Weight:  [100.3 kg (221 lb 1.9 oz)] 100.3 kg (221 lb 1.9 oz) (01/24 0505) Room air  PHYSICAL EXAMINATION: General: mild distress in bed  HEENT: NCAT, EOMi PULM: Upper airway wheezing CV:RRR, no mgr Ab: BS+,soft,nontender Ext: warm, no edema   Recent Labs Lab 12/24/13 0100 12/25/13 0437 12/26/13 0412  NA 141 141 144  K 3.4* 3.1* 2.7*  CL 97 102 102  CO2 30 26 31   BUN 9 6 4*  CREATININE 0.51 0.56 0.60  GLUCOSE 121* 102* 96    Recent Labs Lab 12/23/13 1235 12/24/13 0100  HGB 13.8 14.1  HCT 42.1 42.7  WBC 11.3* 9.4  PLT 320 353    Recent Labs Lab 12/23/13 1235 12/23/13 1833 12/24/13 0100  TROPONINI <0.30 <0.30 <0.30    IMAGING:  CXR 1/22> IMPRESSION:  1. Right upper lobe pneumonia.  2. Stable mild pulmonary vascular congestion.   Dg Esophagus  12/25/2013   CLINICAL DATA:  Dysphagia.  EXAM: ESOPHOGRAM / BARIUM SWALLOW / BARIUM TABLET STUDY   TECHNIQUE: Combined double contrast and single contrast examination performed using effervescent crystals, thick barium liquid, and thin barium liquid. The patient was observed with fluoroscopy swallowing a 29mm barium sulphate tablet.  COMPARISON:  None.  FLUOROSCOPY TIME:  0 min 55 seconds  FINDINGS: The oropharyngeal swallowing mechanisms are normal. There is small sliding hiatal hernia with a stricture at the gastroesophageal junction. There occasional tertiary contractions in the distal esophagus. The patient ingested a 13 mm barium tablet any did not pass through the area of stricture at the GE junction despite risk repeated swallows of thick barium, water, and Valsalva maneuver.  IMPRESSION: Distal esophageal stricture just above a small sliding hiatal hernia.   Electronically Signed   By: Rozetta Nunnery M.D.   On: 12/25/2013 15:29  Interesting that GI eval by DrPatterson 5/14 w/ EGD showed no stricture (but pos reflux related changes), neg HPylori, and gastric emptying scan was normal...    ASSESSMENT / PLAN:  1) CAP. Currently involving primarily RUL. RSV bronchitis with possible bacterial superinfection.  Aeration a little better. Alternative explanation  for PNA could be reflux/aspiration given GI issues Right sided Pleuritic CP. In the setting of PNA. this has improved.   H/o asthma: not currently in bronchospasm  VCD: She has had significant reflux overnight and continues to cough. Her upper airway exam is very impressive with marked pseudo-wheeze.  Plan Cont resp isolation for RSV Instructed  to avoid children with RSV Cont Empiric levaquin PRN analgesia  Home BDs Continue high GERD/reflux rx=>  codeine/phenergan syrup to help limit cough  2) H/o CAD w/ abnormal ECG: age undetermined inferior changes.  CEs negative  Plan D/c tele Cont home meds  3) GERD/ nausea/vomiting  She has been taking nexium 80 mg/ twice a day at home. Think that the recent URI/PNA has exacerbated her  GERD.  Barium swallow showed narrowing and pill wouldn't pass Plan Reflux precautions protonix 80mg  bid  HS pepcid 40 Cough suppression to help decrease it's effect on reflux Add short course of reglan AC & HS.  Might need GI eval depending on result of barium swallow  4) Hypothyroidism  Plan Cont synthroid  5) Chronic pain Plan Cont home rx    Johney Maine PCCM Pager: [phone] 12/26/2013, 12:20 PM

## 2013-12-27 DIAGNOSIS — R933 Abnormal findings on diagnostic imaging of other parts of digestive tract: Secondary | ICD-10-CM

## 2013-12-27 DIAGNOSIS — R1310 Dysphagia, unspecified: Secondary | ICD-10-CM

## 2013-12-27 DIAGNOSIS — J45909 Unspecified asthma, uncomplicated: Secondary | ICD-10-CM

## 2013-12-27 LAB — PROCALCITONIN

## 2013-12-27 MED ORDER — POTASSIUM CHLORIDE 20 MEQ/15ML (10%) PO LIQD
20.0000 meq | ORAL | Status: AC
  Administered 2013-12-27 (×4): 20 meq via ORAL
  Filled 2013-12-27 (×4): qty 15

## 2013-12-27 MED ORDER — POTASSIUM CHLORIDE CRYS ER 20 MEQ PO TBCR
40.0000 meq | EXTENDED_RELEASE_TABLET | Freq: Two times a day (BID) | ORAL | Status: DC
  Filled 2013-12-27 (×2): qty 2

## 2013-12-27 NOTE — Progress Notes (Signed)
PULMONARY PROGRESS NOTE  Name: Shelby Anderson MRN: [id number] DOB: 08-11-1956    ADMISSION DATE:  12/23/2013   PRIMARY SERVICE:  PCCM   CHIEF COMPLAINT:  Right sided chest pain, fever and cough   BRIEF PATIENT DESCRIPTION:  This is a 58 year old female f/b RA for asthma. Presented for sick visit 1/21 w/ fevers, pleuritic CP and associated dyspnea. CXR c/w RLL PNA. Admitted for further eval and rx.   SIGNIFICANT EVENTS / STUDIES:  1/23 Barium swallow >> narrowing distal esophagus, pill wouldn't pass (GI has been consulted)  LINES / TUBES:   CULTURES: Sputum 1/22>>> NTF U strep 1/21>>> neg U legionella 1/21>>> neg resp viral panel 1/21>>> RSV Influenza PCR 1/21>>>neg  ANTIBIOTICS: levaquin 1/21>>> tamiflu 1/21>>>1/22  SUBJECTIVE:  Nausea post barium swallow today, cough persists, hasn't taken PO, nausea today  Sched MEDS:  . aspirin EC  81 mg Oral Daily  . atorvastatin  40 mg Oral q1800  . enoxaparin (LOVENOX) injection  40 mg Subcutaneous Q24H  . famotidine  40 mg Oral QHS  . fludrocortisone  0.1 mg Oral BID  . gabapentin  300 mg Oral QID  . isosorbide mononitrate  60 mg Oral Daily  . l-methylfolate-B6-B12  1 tablet Oral Daily  . levofloxacin (LEVAQUIN) IV  750 mg Intravenous Q24H  . levothyroxine  25 mcg Oral QAC breakfast  . metoCLOPramide (REGLAN) injection  5 mg Intravenous TID AC & HS  . metoprolol succinate  50 mg Oral BID  . mometasone-formoterol  2 puff Inhalation BID  . montelukast  10 mg Oral QHS  . multivitamin with minerals  1 tablet Oral Daily  . pantoprazole  80 mg Oral BID AC  . potassium chloride  40 mEq Oral Once  . sucralfate  2 g Oral TID WC & HS     VITAL SIGNS: Temp:  [98 F (36.7 C)-98.8 F (37.1 C)] 98.5 F (36.9 C) (01/25 0428) Pulse Rate:  [73-87] 73 (01/25 0428) Resp:  [20-21] 20 (01/25 0428) BP: (117-162)/(58-87) 162/86 mmHg (01/25 0428) SpO2:  [2 %-99 %] 92 % (01/25 0822) Weight:  [99.3 kg (218 lb 14.7 oz)] 99.3 kg (218  lb 14.7 oz) (01/25 0428)  PHYSICAL EXAMINATION: General: mild distress in bed  HEENT: NCAT, EOMi PULM: Upper airway wheezing CV:RRR, no mgr Ab: BS+,soft,nontender Ext: warm, no edema   Recent Labs Lab 12/24/13 0100 12/25/13 0437 12/26/13 0412  NA 141 141 144  K 3.4* 3.1* 2.7*  CL 97 102 102  CO2 30 26 31   BUN 9 6 4*  CREATININE 0.51 0.56 0.60  GLUCOSE 121* 102* 96    Recent Labs Lab 12/23/13 1235 12/24/13 0100  HGB 13.8 14.1  HCT 42.1 42.7  WBC 11.3* 9.4  PLT 320 353    Recent Labs Lab 12/23/13 1235 12/23/13 1833 12/24/13 0100  TROPONINI <0.30 <0.30 <0.30    IMAGING:  CXR 1/22> IMPRESSION:  1. Right upper lobe pneumonia.  2. Stable mild pulmonary vascular congestion.   No results found.Interesting that GI eval by DrPatterson 5/14 w/ EGD showed no stricture (but pos reflux related changes), neg HPylori, and gastric emptying scan was normal...    ASSESSMENT / PLAN:  1) CAP. Currently involving primarily RUL. RSV bronchitis with possible bacterial superinfection.  Aeration a little better. Alternative explanation  for PNA could be reflux/aspiration given GI issues Right sided Pleuritic CP. In the setting of PNA. this has improved.   H/o asthma: not currently in bronchospasm  VCD: She has  had significant reflux overnight and continues to cough. Her upper airway exam is very impressive with marked pseudo-wheeze.  Plan Cont resp isolation for RSV Instructed to avoid children with RSV Cont Empiric levaquin PRN analgesia  Home BDs Continue high GERD/reflux rx=>  codeine/phenergan syrup to help limit cough 1/25> f/u CXR & Labs in AM  2) H/o CAD w/ abnormal ECG: age undetermined inferior changes.  CEs negative  Plan D/c tele Cont home meds  3) GERD/ nausea/vomiting  She has been taking nexium 80 mg/ twice a day at home. Think that the recent URI/PNA has exacerbated her GERD.  Barium swallow showed narrowing and pill wouldn't pass Plan Reflux  precautions protonix 80mg  bid  HS pepcid 40 Cough suppression to help decrease it's effect on reflux Add short course of reglan AC & HS.  Might need GI eval depending on result of barium swallow  4) Hypokalemia- likely due to N&V Plan -supplement KCl orally  5) Hypothyroidism  Plan Cont synthroid  6) Chronic pain Plan Cont home rx    Johney Maine PCCM Pager: [phone] 12/27/2013, 12:11 PM

## 2013-12-27 NOTE — Progress Notes (Signed)
MD -- please note K+ 2.7 on 12/26/13.

## 2013-12-28 ENCOUNTER — Inpatient Hospital Stay (HOSPITAL_COMMUNITY): Payer: Medicare Other

## 2013-12-28 ENCOUNTER — Telehealth: Payer: Self-pay

## 2013-12-28 LAB — COMPREHENSIVE METABOLIC PANEL
ALBUMIN: 2.6 g/dL — AB (ref 3.5–5.2)
ALT: 23 U/L (ref 0–35)
AST: 23 U/L (ref 0–37)
Alkaline Phosphatase: 65 U/L (ref 39–117)
BUN: 6 mg/dL (ref 6–23)
CALCIUM: 8.6 mg/dL (ref 8.4–10.5)
CO2: 33 mEq/L — ABNORMAL HIGH (ref 19–32)
CREATININE: 0.65 mg/dL (ref 0.50–1.10)
Chloride: 100 mEq/L (ref 96–112)
GFR calc non Af Amer: >90 mL/min (ref >90)
Glucose, Bld: 95 mg/dL (ref 70–99)
POTASSIUM: 2.7 meq/L — AB (ref 3.7–5.3)
Sodium: 145 mEq/L (ref 137–147)
Total Bilirubin: 0.3 mg/dL (ref 0.3–1.2)
Total Protein: 7 g/dL (ref 6.0–8.3)

## 2013-12-28 LAB — CBC
HEMATOCRIT: 40.3 % (ref 36.0–46.0)
HEMOGLOBIN: 12.7 g/dL (ref 12.0–15.0)
MCH: 28.4 pg (ref 26.0–34.0)
MCHC: 31.5 g/dL (ref 30.0–36.0)
MCV: 90.2 fL (ref 78.0–100.0)
Platelets: 336 10*3/uL (ref 150–400)
RBC: 4.47 MIL/uL (ref 3.87–5.11)
RDW: 13.7 % (ref 11.5–15.5)
WBC: 5.3 10*3/uL (ref 4.0–10.5)

## 2013-12-28 MED ORDER — POTASSIUM CHLORIDE 20 MEQ/15ML (10%) PO LIQD
40.0000 meq | ORAL | Status: AC
  Administered 2013-12-28 (×3): 40 meq via ORAL
  Filled 2013-12-28 (×3): qty 30

## 2013-12-28 MED ORDER — LEVOFLOXACIN IN D5W 750 MG/150ML IV SOLN
750.0000 mg | Freq: Every day | INTRAVENOUS | Status: DC
  Administered 2013-12-28: 750 mg via INTRAVENOUS
  Filled 2013-12-28 (×2): qty 150

## 2013-12-28 NOTE — Telephone Encounter (Signed)
Message copied by Algernon Huxley on Mon Dec 28, 2013  9:59 AM ------      Message from: Vena Rua      Created: Sun Dec 27, 2013  1:17 PM       Hi:      Pt seen in hospital today and new to Dr Alanda Slim pt.  Needs office visit in about 3 to 4 weeks, with JP or extender.        Ultimately will need to be set up for EGD and esophageal dilatation of esophageal stricture      Dysphagia and stricture are reason for office visit.      She is inpt with pneumonia and needs to recover from this before elective EGD.             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

## 2013-12-28 NOTE — Progress Notes (Signed)
CRITICAL VALUE ALERT  Critical value received: K 2.7  Date of notification:  12/28/13  Time of notification:  0605  Critical value read back:yes  Nurse who received alert:  Edilia Bo   MD notified (1st page):  Selena Batten   Time of first page:  0610  MD notified (2nd page):  Time of second page:  Responding MD:  Selena Batten   Time MD responded:  [phone]  Orders received to give 20MEQ/30ml liquid 77mEq

## 2013-12-28 NOTE — Significant Event (Signed)
Pt has been on IV levaquin for CAP.  In review of CCM note from 1/26 indication is that pt is to remain on levaquin, however levaquin no longer in orders.  Will re-order IV levaquin and have AM rounding team on 1/27 address further.  Chesley Mires, MD 12/28/2013, 10:41 PM

## 2013-12-28 NOTE — Telephone Encounter (Signed)
Pt scheduled to see Dr. Henrene Pastor 01/22/14@10 :15am. Appt letter mailed to pt.

## 2013-12-28 NOTE — Progress Notes (Addendum)
PULMONARY PROGRESS NOTE  Name: Shelby Anderson MRN: [id number] DOB: [birth year]    ADMISSION DATE:  12/23/2013   PRIMARY SERVICE:  PCCM   CHIEF COMPLAINT:  Right sided chest pain, fever and cough   BRIEF PATIENT DESCRIPTION:  This is a 58 year old female f/b RA for asthma. Presented for sick visit 1/21 w/ fevers, pleuritic CP and associated dyspnea. CXR c/w RLL PNA. Admitted for further eval and rx.   SIGNIFICANT EVENTS / STUDIES:  1/23 Barium swallow >> narrowing distal esophagus, pill wouldn't pass (GI has been consulted) 1/26 scheduled to follow up with GI at [facility] in future. LINES / TUBES:   CULTURES: Sputum 1/22>>> NTF U strep 1/21>>> neg U legionella 1/21>>> neg resp viral panel 1/21>>> RSV Influenza PCR 1/21>>>neg  ANTIBIOTICS: levaquin 1/21>>> tamiflu 1/21>>>1/22  SUBJECTIVE:  Congested cough  Sched MEDS:  . aspirin EC  81 mg Oral Daily  . atorvastatin  40 mg Oral q1800  . enoxaparin (LOVENOX) injection  40 mg Subcutaneous Q24H  . famotidine  40 mg Oral QHS  . fludrocortisone  0.1 mg Oral BID  . gabapentin  300 mg Oral QID  . isosorbide mononitrate  60 mg Oral Daily  . l-methylfolate-B6-B12  1 tablet Oral Daily  . levothyroxine  25 mcg Oral QAC breakfast  . metoCLOPramide (REGLAN) injection  5 mg Intravenous TID AC & HS  . metoprolol succinate  50 mg Oral BID  . mometasone-formoterol  2 puff Inhalation BID  . montelukast  10 mg Oral QHS  . multivitamin with minerals  1 tablet Oral Daily  . pantoprazole  80 mg Oral BID AC  . potassium chloride  40 mEq Oral Q4H  . sucralfate  2 g Oral TID WC & HS     VITAL SIGNS: Temp:  [98 F (36.7 C)-98.4 F (36.9 C)] 98.4 F (36.9 C) (01/26 0438) Pulse Rate:  [72-74] 74 (01/26 0438) Resp:  [16] 16 (01/26 0438) BP: (107-132)/(56-75) 128/75 mmHg (01/26 0438) SpO2:  [92 %-96 %] 95 % (01/26 1002) Weight:  [222 lb 8 oz (100.925 kg)] 222 lb 8 oz (100.925 kg) (01/26 0438)  PHYSICAL EXAMINATION: General: mild  distress in bed  HEENT: NCAT, EOMi PULM: Upper airway wheezing CV:RRR, no mgr Ab: BS+,soft,nontender Ext: warm, no edema   Recent Labs Lab 12/25/13 0437 12/26/13 0412 12/28/13 0455  NA 141 144 145  K 3.1* 2.7* 2.7*  CL 102 102 100  CO2 26 31 33*  BUN 6 4* 6  CREATININE 0.56 0.60 0.65  GLUCOSE 102* 96 95    Recent Labs Lab 12/23/13 1235 12/24/13 0100 12/28/13 0455  HGB 13.8 14.1 12.7  HCT 42.1 42.7 40.3  WBC 11.3* 9.4 5.3  PLT 320 353 336    Recent Labs Lab 12/23/13 1235 12/23/13 1833 12/24/13 0100  TROPONINI <0.30 <0.30 <0.30    IMAGING:  CXR 1/22> IMPRESSION:  1. Right upper lobe pneumonia.  2. Stable mild pulmonary vascular congestion.   Dg Chest Port 1 View  12/28/2013   CLINICAL DATA:  Pneumonia  EXAM: PORTABLE CHEST - 1 VIEW  COMPARISON:  12/24/2013  FINDINGS: Cardiac shadow is stable. A monitoring device is again seen. The left lung remains clear. The right lung shows no significant infiltrate. No effusion is seen. No acute bony abnormality is noted.  IMPRESSION: Clearing of previous seen right upper lobe pneumonia.   Electronically Signed   By: Inez Catalina M.D.   On: 12/28/2013 08:11  Interesting that GI eval by  DrPatterson 5/14 w/ EGD showed no stricture (but pos reflux related changes), neg HPylori, and gastric emptying scan was normal...    ASSESSMENT / PLAN:  1) CAP. Currently involving primarily RUL. RSV bronchitis with possible bacterial superinfection.  Aeration a little better. Alternative explanation  for PNA could be reflux/aspiration given GI issues Right sided Pleuritic CP. In the setting of PNA. this has improved.   H/o asthma: not currently in bronchospasm  VCD: She has had significant reflux overnight and continues to cough. Her upper airway exam is very impressive with marked pseudo-wheeze.  Plan Cont resp isolation for RSV Instructed to avoid children with RSV Cont Empiric levaquin PRN analgesia  Home BDs Continue high  GERD/reflux rx=>  Codeine/phenergan syrup to help limit cough 1/27> f/u CXR & Labs in AM  2) H/o CAD w/ abnormal ECG: age undetermined inferior changes.  CEs negative  Plan D/C tele Cont home meds  3) GERD/ nausea/vomiting  She has been taking nexium 80 mg/ twice a day at home. Think that the recent URI/PNA has exacerbated her GERD.  Barium swallow showed narrowing and pill wouldn't pass Plan Reflux precautions Protonix 80 mg bid  HS pepcid 40 Cough suppression to help decrease it's effect on reflux Add short course of reglan AC & HS.   GI eval as outpatient as delineated by GI.  4) Hypokalemia- likely due to N&V Plan -supplement KCl orally  5) Hypothyroidism  Plan Cont synthroid  6) Chronic pain Plan Cont home rx    [facility] [place] Minor ACNP Maryanna Shape PCCM Pager [phone] till 3 pm If no answer page [phone] 12/28/2013, 1:05 PM  Continue isolation and treatment as ordered.  Reflux treatment as above.  GI to see as outpatient after treatment of PNA is addressed.  Rush Farmer, M.D. [facility] Pulmonary/Critical Care Medicine. Pager: [phone]. After hours pager: [phone].

## 2013-12-28 NOTE — Progress Notes (Signed)
eLink Physician-Brief Progress Note Patient Name: Shelby Anderson DOB: 1955/12/05 MRN: [id number]  Date of Service  12/28/2013   HPI/Events of Note  Persistent hypokalemia   eICU Interventions  Potassium replaced   Intervention Category Intermediate Interventions: Electrolyte abnormality - evaluation and management  DETERDING,ELIZABETH 12/28/2013, 6:19 AM

## 2013-12-28 NOTE — Progress Notes (Signed)
FYI--pt confused as she did not get her IV antibiotic today at 1400-Levaquin. Pt states the "critical care doctor" said that she needed 2 more days of IV antibiotics before she went home. Last dose of Levaquin was Sunday, 12/27/13 at 1400. Please advise.

## 2013-12-29 ENCOUNTER — Inpatient Hospital Stay (HOSPITAL_COMMUNITY): Payer: Medicare Other

## 2013-12-29 LAB — CULTURE, BLOOD (ROUTINE X 2)
CULTURE: NO GROWTH
Culture: NO GROWTH

## 2013-12-29 LAB — BASIC METABOLIC PANEL
BUN: 6 mg/dL (ref 6–23)
CALCIUM: 8.4 mg/dL (ref 8.4–10.5)
CO2: 31 meq/L (ref 19–32)
Chloride: 102 mEq/L (ref 96–112)
Creatinine, Ser: 0.61 mg/dL (ref 0.50–1.10)
GFR calc Af Amer: >90 mL/min (ref >90)
GLUCOSE: 107 mg/dL — AB (ref 70–99)
Potassium: 3.4 mEq/L — ABNORMAL LOW (ref 3.7–5.3)
Sodium: 142 mEq/L (ref 137–147)

## 2013-12-29 LAB — CBC
HEMATOCRIT: 38.6 % (ref 36.0–46.0)
HEMOGLOBIN: 12.3 g/dL (ref 12.0–15.0)
MCH: 28.6 pg (ref 26.0–34.0)
MCHC: 31.9 g/dL (ref 30.0–36.0)
MCV: 89.8 fL (ref 78.0–100.0)
Platelets: 334 10*3/uL (ref 150–400)
RBC: 4.3 MIL/uL (ref 3.87–5.11)
RDW: 13.7 % (ref 11.5–15.5)
WBC: 6.1 10*3/uL (ref 4.0–10.5)

## 2013-12-29 MED ORDER — PREDNISONE 20 MG PO TABS
40.0000 mg | ORAL_TABLET | Freq: Every day | ORAL | Status: DC
  Administered 2013-12-29 – 2014-01-01 (×4): 40 mg via ORAL
  Filled 2013-12-29 (×5): qty 2

## 2013-12-29 MED ORDER — LEVOFLOXACIN 500 MG PO TABS
500.0000 mg | ORAL_TABLET | Freq: Every day | ORAL | Status: AC
  Administered 2013-12-29 – 2014-01-01 (×4): 500 mg via ORAL
  Filled 2013-12-29 (×4): qty 1

## 2013-12-29 MED ORDER — ALBUTEROL SULFATE (2.5 MG/3ML) 0.083% IN NEBU
2.5000 mg | INHALATION_SOLUTION | RESPIRATORY_TRACT | Status: DC | PRN
  Administered 2013-12-30 – 2014-01-01 (×7): 2.5 mg via RESPIRATORY_TRACT
  Filled 2013-12-29 (×7): qty 3

## 2013-12-29 MED ORDER — POTASSIUM CHLORIDE CRYS ER 20 MEQ PO TBCR
40.0000 meq | EXTENDED_RELEASE_TABLET | Freq: Once | ORAL | Status: AC
  Administered 2013-12-29: 40 meq via ORAL
  Filled 2013-12-29: qty 2

## 2013-12-29 NOTE — Significant Event (Signed)
Pt c/o wheezing.  Will order PRN albuterol nebulizer treatment.  Chesley Mires, MD 12/29/2013, 10:44 PM

## 2013-12-29 NOTE — Progress Notes (Signed)
Received patient from [place].  No change in patient assessment

## 2013-12-29 NOTE — Progress Notes (Signed)
PULMONARY PROGRESS NOTE  Name: Shelby Anderson MRN: [id number] DOB: [birth year]    ADMISSION DATE:  12/23/2013   PRIMARY SERVICE:  PCCM   CHIEF COMPLAINT:  Right sided chest pain, fever and cough   BRIEF PATIENT DESCRIPTION:  This is a 58 year old female f/b RA for asthma. Presented for sick visit 1/21 w/ fevers, pleuritic CP and associated dyspnea. CXR c/w RLL PNA. Admitted for further eval and rx.   SIGNIFICANT EVENTS / STUDIES:  1/23 Barium swallow >> narrowing distal esophagus, pill wouldn't pass (GI consulted) 1/26 scheduled to follow up with GI at [facility] in future. LINES / TUBES:   CULTURES: Sputum 1/22>>> NTF U strep 1/21>>> neg U legionella 1/21>>> neg resp viral panel 1/21>>> RSV Influenza PCR 1/21>>>neg  ANTIBIOTICS: levaquin 1/21>>> tamiflu 1/21>>>1/22  SUBJECTIVE:  Congested cough but better  Sched MEDS:  . aspirin EC  81 mg Oral Daily  . atorvastatin  40 mg Oral q1800  . enoxaparin (LOVENOX) injection  40 mg Subcutaneous Q24H  . famotidine  40 mg Oral QHS  . fludrocortisone  0.1 mg Oral BID  . gabapentin  300 mg Oral QID  . isosorbide mononitrate  60 mg Oral Daily  . l-methylfolate-B6-B12  1 tablet Oral Daily  . levofloxacin (LEVAQUIN) IV  750 mg Intravenous QHS  . levothyroxine  25 mcg Oral QAC breakfast  . metoCLOPramide (REGLAN) injection  5 mg Intravenous TID AC & HS  . metoprolol succinate  50 mg Oral BID  . mometasone-formoterol  2 puff Inhalation BID  . montelukast  10 mg Oral QHS  . multivitamin with minerals  1 tablet Oral Daily  . pantoprazole  80 mg Oral BID AC  . sucralfate  2 g Oral TID WC & HS     VITAL SIGNS: Temp:  [98 F (36.7 C)-98.3 F (36.8 C)] 98 F (36.7 C) (01/27 0510) Pulse Rate:  [59-79] 59 (01/27 0510) Resp:  [16-20] 16 (01/27 0510) BP: (115-136)/(64-82) 124/82 mmHg (01/27 0510) SpO2:  [94 %-95 %] 94 % (01/27 0813) Weight:  [225 lb (102.059 kg)] 225 lb (102.059 kg) (01/27 0500)  PHYSICAL EXAMINATION: General:  mild distress in bed  HEENT: NCAT, EOMi, hoarse voice PULM: Upper airway wheezing CV:RRR, no mgr Ab: BS+,soft,nontender Ext: warm, no edema   Recent Labs Lab 12/26/13 0412 12/28/13 0455 12/29/13 0340  NA 144 145 142  K 2.7* 2.7* 3.4*  CL 102 100 102  CO2 31 33* 31  BUN 4* 6 6  CREATININE 0.60 0.65 0.61  GLUCOSE 96 95 107*    Recent Labs Lab 12/24/13 0100 12/28/13 0455 12/29/13 0340  HGB 14.1 12.7 12.3  HCT 42.7 40.3 38.6  WBC 9.4 5.3 6.1  PLT 353 336 334    Recent Labs Lab 12/23/13 1235 12/23/13 1833 12/24/13 0100  TROPONINI <0.30 <0.30 <0.30    IMAGING:  CXR 1/22> IMPRESSION:  1. Right upper lobe pneumonia.  2. Stable mild pulmonary vascular congestion.   Dg Chest Port 1 View  12/29/2013   CLINICAL DATA:  Pneumonia  EXAM: PORTABLE CHEST - 1 VIEW  COMPARISON:  Chest radiograph 12/28/2013.  FINDINGS: Stable enlarged cardiac and mediastinal contours. Persistent elevation right hemidiaphragm. No consolidative pulmonary opacities. No pleural effusion pneumothorax. Regional skeleton is unremarkable.  IMPRESSION: No acute cardiopulmonary process.   Electronically Signed   By: Lovey Newcomer M.D.   On: 12/29/2013 07:52   Dg Chest Port 1 View  12/28/2013   CLINICAL DATA:  Pneumonia  EXAM: PORTABLE CHEST -  1 VIEW  COMPARISON:  12/24/2013  FINDINGS: Cardiac shadow is stable. A monitoring device is again seen. The left lung remains clear. The right lung shows no significant infiltrate. No effusion is seen. No acute bony abnormality is noted.  IMPRESSION: Clearing of previous seen right upper lobe pneumonia.   Electronically Signed   By: Inez Catalina M.D.   On: 12/28/2013 08:11  Interesting that GI eval by DrPatterson 5/14 w/ EGD showed no stricture (but pos reflux related changes), neg HPylori, and gastric emptying scan was normal...    ASSESSMENT / PLAN:  1) CAP. Currently involving primarily RUL. RSV bronchitis with possible bacterial superinfection.  Aeration a little  better. Alternative explanation  for PNA could be reflux/aspiration given GI issues Right sided Pleuritic CP. In the setting of PNA. this has improved.   H/o asthma: not currently in bronchospasm  VCD: She has had significant reflux overnight and continues to cough. Her upper airway exam is very impressive with marked pseudo-wheeze.  Plan Cont resp isolation for RSV Instructed to avoid children with RSV Cont Empiric levaquin iv for 2 more days PRN analgesia  Home BDs Continue high GERD/reflux rx=>  Codeine/phenergan syrup to help limit cough pred 40 - taper over 1 week   2) H/o CAD w/ abnormal ECG: age undetermined inferior changes.  CEs negative  Plan D/C tele Cont home meds  3) GERD/ nausea/vomiting  She has been taking nexium 80 mg/ twice a day at home. Think that the recent URI/PNA has exacerbated her GERD.  Barium swallow showed narrowing and pill wouldn't pass Plan Reflux precautions Protonix 80 mg bid  HS pepcid 40 Cough suppression to help decrease it's effect on reflux Add short course of reglan AC & HS.   GI eval as outpatient as delineated by GI.  4) Hypokalemia- likely due to N&V Plan  Recent Labs Lab 12/26/13 0412 12/28/13 0455 12/29/13 0340  K 2.7* 2.7* 3.4*     -supplement KCl orally  5) Hypothyroidism  Plan Cont synthroid  6) Chronic pain Plan Cont home rx   7) Endocrine- on fludrocortisone as outpt   Richardson Landry Minor ACNP Maryanna Shape PCCM Pager [phone] till 3 pm If no answer page [phone]  Independently examined pt, evaluated data & formulated above care plan with NP who scribed this note & edited by me. -favor aspiration from esophageal stricture over RSV here, will need GI FU with Dr Henrene Pastor for stricture dilation Short couse -1 week of PO steroids for bronchospasm. Hope to dc in [place] MD. Shade Flood. [place] [facility] Pager [phone] If no response call [phone]   12/29/2013, 11:15 AM

## 2013-12-30 LAB — BASIC METABOLIC PANEL
BUN: 8 mg/dL (ref 6–23)
CHLORIDE: 101 meq/L (ref 96–112)
CO2: 28 mEq/L (ref 19–32)
CREATININE: 0.5 mg/dL (ref 0.50–1.10)
Calcium: 8.2 mg/dL — ABNORMAL LOW (ref 8.4–10.5)
GFR calc non Af Amer: >90 mL/min (ref >90)
Glucose, Bld: 230 mg/dL — ABNORMAL HIGH (ref 70–99)
POTASSIUM: 4 meq/L (ref 3.7–5.3)
Sodium: 138 mEq/L (ref 137–147)

## 2013-12-30 LAB — PHOSPHORUS: Phosphorus: 2.3 mg/dL (ref 2.3–4.6)

## 2013-12-30 LAB — MAGNESIUM: Magnesium: 1.9 mg/dL (ref 1.5–2.5)

## 2013-12-30 NOTE — Progress Notes (Signed)
During bedside assessment , patient respiratory assessment reflected expiratory wheezing and tightness

## 2013-12-30 NOTE — Progress Notes (Signed)
Report called to Caren Griffins, RN for transfer to [place]

## 2013-12-30 NOTE — Progress Notes (Addendum)
Pt c/of calf pain in her Lt leg. Not warm or red, Neg. Homan sign. Pt states the MD is going to let her go home 1/29

## 2013-12-30 NOTE — Progress Notes (Signed)
Pt is coughing a lot . Assessed breath sounds. Breath sound are tight throughout with exp. wheezes. Patient feels like she can not caught her breath. Requested Resp. Therapist give PRN breathing treatment . Will cont to to monitor.

## 2013-12-31 DIAGNOSIS — B974 Respiratory syncytial virus as the cause of diseases classified elsewhere: Secondary | ICD-10-CM

## 2013-12-31 DIAGNOSIS — B338 Other specified viral diseases: Secondary | ICD-10-CM

## 2013-12-31 MED ORDER — ADULT MULTIVITAMIN W/MINERALS CH
1.0000 | ORAL_TABLET | Freq: Every day | ORAL | Status: DC
  Administered 2013-12-31 – 2014-01-01 (×2): 1 via ORAL
  Filled 2013-12-31 (×2): qty 1

## 2013-12-31 NOTE — Progress Notes (Signed)
PULMONARY PROGRESS NOTE  Name: Shelby Anderson MRN: [id number] DOB: 1956/10/12    ADMISSION DATE:  12/23/2013   PRIMARY SERVICE:  PCCM   CHIEF COMPLAINT:  Right sided chest pain, fever and cough   BRIEF PATIENT DESCRIPTION:  This is a 58 year old female f/b RA for asthma (mild intermittent v pseudoasthma). Presented for sick visit 1/21 w/ fevers, pleuritic CP and associated dyspnea. CXR c/w RLL PNA. Admitted for further eval and rx.   SIGNIFICANT EVENTS / STUDIES:  1/23 Barium swallow >> narrowing distal esophagus, pill wouldn't pass (GI consulted) 1/26 scheduled to follow up with GI at [facility] in future. 1/29 better but needs one more day   LINES / TUBES:   CULTURES: Sputum 1/22>>> NTF U strep 1/21>>> neg U legionella 1/21>>> neg resp viral panel 1/21>>> RSV-A Influenza PCR 1/21>>>neg  ANTIBIOTICS: levaquin 1/21>>> tamiflu 1/21>>>1/22  SUBJECTIVE:  Congested cough but better, no episode of choking but took neb treatment in early am to preempt choking spell.   Sched MEDS:  . aspirin EC  81 mg Oral Daily  . atorvastatin  40 mg Oral q1800  . enoxaparin (LOVENOX) injection  40 mg Subcutaneous Q24H  . famotidine  40 mg Oral QHS  . fludrocortisone  0.1 mg Oral BID  . gabapentin  300 mg Oral QID  . isosorbide mononitrate  60 mg Oral Daily  . l-methylfolate-B6-B12  1 tablet Oral Daily  . levofloxacin  500 mg Oral Daily  . levothyroxine  25 mcg Oral QAC breakfast  . metoCLOPramide (REGLAN) injection  5 mg Intravenous TID AC & HS  . metoprolol succinate  50 mg Oral BID  . mometasone-formoterol  2 puff Inhalation BID  . montelukast  10 mg Oral QHS  . multivitamin with minerals  1 tablet Oral Q1200  . pantoprazole  80 mg Oral BID AC  . predniSONE  40 mg Oral Q breakfast  . sucralfate  2 g Oral TID WC & HS     VITAL SIGNS: Temp:  [98.1 F (36.7 C)-98.6 F (37 C)] 98.6 F (37 C) (01/29 0610) Pulse Rate:  [88-98] 98 (01/29 0610) Resp:  [18-20] 20 (01/29  0610) BP: (115-139)/(62-80) 115/62 mmHg (01/29 0610) SpO2:  [93 %-99 %] 96 % (01/29 0854)  PHYSICAL EXAMINATION: General: Less distress in bed  HEENT: NCAT, EOMi, hoarse voice, improving PULM: Upper airway wheezing, better CV:RRR, no mgr Ab: BS+,soft,nontender Ext: warm, no edema   Recent Labs Lab 12/28/13 0455 12/29/13 0340 12/30/13 0340  NA 145 142 138  K 2.7* 3.4* 4.0  CL 100 102 101  CO2 33* 31 28  BUN 6 6 8   CREATININE 0.65 0.61 0.50  GLUCOSE 95 107* 230*    Recent Labs Lab 12/28/13 0455 12/29/13 0340  HGB 12.7 12.3  HCT 40.3 38.6  WBC 5.3 6.1  PLT 336 334   No results found for this basename: CKTOTAL, CKMB, TROPONINI,  in the last 168 hours  IMAGING:  CXR 1/22> IMPRESSION:  1. Right upper lobe pneumonia.  2. Stable mild pulmonary vascular congestion.   No results found.Interesting that GI eval by DrPatterson 5/14 w/ EGD showed no stricture (but pos reflux related changes), neg HPylori, and gastric emptying scan was normal...    ASSESSMENT / PLAN:  1) CAP. Currently involving primarily RUL. RSV bronchitis with possible bacterial superinfection.  Aeration a little better. Alternative explanation  for PNA could be reflux/aspiration given GI issues Right sided Pleuritic CP. In the setting of PNA. this has  improved.   H/o asthma: not currently in bronchospasm  VCD: She has had significant reflux overnight and continues to cough. Her upper airway exam is very impressive with marked pseudo-wheeze.  Plan Cont resp isolation for RSV Instructed to avoid children with RSV Cont Empiric levaquin iv for 1 more days c in am 1-30 PRN analgesia  Home BDs Add nebulizer to home regimen on PRN basis  Continue high GERD/reflux rx=>  Codeine/phenergan syrup to help limit cough pred 40 - taper over 1 week   2) H/o CAD w/ abnormal ECG: age undetermined inferior changes.  CEs negative  Plan D/C tele Cont home meds  3) GERD/ nausea/vomiting  She has been taking  nexium 80 mg/ twice a day at home. Think that the recent URI/PNA has exacerbated her GERD.  Barium swallow showed narrowing and pill wouldn't pass Plan Reflux precautions Protonix 80 mg bid  HS pepcid 40 Cough suppression to help decrease it's effect on reflux Add short course of reglan AC & HS.   GI eval as outpatient as delineated by GI.  4) Hypokalemia- likely due to N&V Plan  Recent Labs Lab 12/28/13 0455 12/29/13 0340 12/30/13 0340  K 2.7* 3.4* 4.0     -supplement KCl orally  5) Hypothyroidism  Plan Cont synthroid  6) Chronic pain Plan Cont home rx   7) Endocrine- on fludrocortisone as outpt  Note: She was seen and examined by Richardson Landry Minor ACNP and orders written on 1/28. Note was not done at that time but results were acted upon. Plan is to discharge her in am.   [facility] Pager [phone] till 3 pm If no answer page [phone]  STAFF NOTE: I, Dr Ann Lions have personally reviewed patient's available data, including medical history, events of note, physical examination and test results as part of my evaluation. I have discussed with resident/NP and other care providers such as pharmacist, RN and RRT.  In addition,  I personally evaluated patient and elicited key findings of RSV related asthma exacerbation. Feels well enoght to go home tomorrow. Still some sporadic wheezing and chest tightness present. She will benefit from exhaled NO as opd (done at [employer], Dr Remus Blake) to sort out if she has true asthma or not. I sawe this patient 12/31/2013 .  Rest per NP/medical resident whose note is outlined above and that I agree with      Dr. Brand Males, M.D., [facility].C.P Pulmonary and Critical Care Medicine Staff Physician [facility] Pager: [phone], If no answer or between  15:00h - 7:00h: call [phone]  12/31/2013 12:31 PM

## 2014-01-01 LAB — BASIC METABOLIC PANEL
BUN: 6 mg/dL (ref 6–23)
CO2: 34 meq/L — AB (ref 19–32)
Calcium: 8.7 mg/dL (ref 8.4–10.5)
Chloride: 102 mEq/L (ref 96–112)
Creatinine, Ser: 0.54 mg/dL (ref 0.50–1.10)
GFR calc Af Amer: >90 mL/min (ref >90)
GFR calc non Af Amer: >90 mL/min (ref >90)
Glucose, Bld: 121 mg/dL — ABNORMAL HIGH (ref 70–99)
POTASSIUM: 3.1 meq/L — AB (ref 3.7–5.3)
SODIUM: 146 meq/L (ref 137–147)

## 2014-01-01 MED ORDER — PREDNISONE 10 MG PO TABS
ORAL_TABLET | ORAL | Status: DC
Start: 2014-01-01 — End: 2014-02-22

## 2014-01-01 MED ORDER — PROMETHAZINE-CODEINE 6.25-10 MG/5ML PO SYRP: 5.0000 mL | ORAL_SOLUTION | ORAL | Status: DC | PRN

## 2014-01-01 MED ORDER — ALBUTEROL SULFATE (2.5 MG/3ML) 0.083% IN NEBU: 2.5000 mg | INHALATION_SOLUTION | Freq: Four times a day (QID) | RESPIRATORY_TRACT | Status: DC | PRN

## 2014-01-01 NOTE — Discharge Summary (Signed)
Physician Discharge Summary  Patient ID: Shelby Anderson MRN: [id number] DOB/AGE: 56-Jan-1957 61 y.o.  Admit date: 12/23/2013 Discharge date: 01/01/2014  Problem List Active Problems:   Asthma exacerbation   Coronary artery disease   Acute bronchitis   Asthma   OSA (obstructive sleep apnea)   Post-menopausal bleeding   Chest pain of uncertain etiology   CAP (community acquired pneumonia)   RSV (respiratory syncytial virus infection)  HPI: This is a 58 year old female f/b Dr Elsworth Soho for asthma. Presented to the office on 1/21 for sick visit. Reported symptoms started ~ 1 week prior w/ non-productive cough. Did have several sick exposures in family (husband and grandchildren). Had been feeling sick w/ poor po intake, some dry heaves and cough progressing since 1/14. Started to have productive cough and some CP associated w/ cough on 1/15. Cough and right sided CP continued to worsen, developed fever 1/17 and 1/17. Presented to our office with severe 10/10 chest pain with deep breath or cough, persistent cough and weakness to the point she felt she was going to pass out. We sent her for direct admit. Initial CXR c/w PNA.     Hospital Course:  SIGNIFICANT EVENTS / STUDIES:  1/23 Barium swallow >> narrowing distal esophagus, pill wouldn't pass (GI consulted)  1/26 scheduled to follow up with GI at [facility] in future.  1/29 better but needs one more day  LINES / TUBES:  CULTURES:  Sputum 1/22>>> NTF  U strep 1/21>>> neg  U legionella 1/21>>> neg  resp viral panel 1/21>>> RSV-A  Influenza PCR 1/21>>>neg  ANTIBIOTICS:  levaquin 1/21>>> 1/30 tamiflu 1/21>>>1/22   ASSESSMENT / PLAN:  1) CAP. Currently involving primarily RUL. RSV bronchitis with possible bacterial superinfection.  Aeration a little better. Alternative explanation for PNA could be reflux/aspiration given GI issues  Right sided Pleuritic CP. In the setting of PNA. this has improved.  H/o asthma: not currently in  bronchospasm  VCD: She has had significant reflux overnight and continues to cough. Her upper airway exam is very impressive with marked pseudo-wheeze.  Plan  Cont resp isolation for RSV  Instructed to avoid children with RSV  Cont Empiric levaquin iv for 1 more days c in am 1-30  PRN analgesia  Home BDs  Add nebulizer to home regimen on PRN basis  Continue high GERD/reflux rx=>  Codeine/phenergan syrup to help limit cough  pred 40 - taper over 1 week  2) H/o CAD w/ abnormal ECG: age undetermined inferior changes.  CEs negative  Plan  D/C tele  Cont home meds  3) GERD/ nausea/vomiting  She has been taking nexium 80 mg/ twice a day at home. Barium swallow showed narrowing and pill wouldn't pass  Plan  Reflux precautions  Protonix 80 mg bid  HS pepcid 40  Cough suppression to help decrease it's effect on reflux  Add short course of reglan AC & HS.  GI eval as outpatient as delineated by GI.  4) Hypokalemia- likely due to N&V  Plan   Recent Labs  Lab  12/28/13 0455  12/29/13 0340  12/30/13 0340   K  2.7*  3.4*  4.0    -supplement KCl orally  5) Hypothyroidism  Plan  Cont synthroid  6) Chronic pain  Plan  Cont home rx  7) Endocrine- on fludrocortisone as outpt        Labs at discharge Lab Results  Component Value Date   CREATININE 0.54 01/01/2014   BUN 6 01/01/2014  NA 146 01/01/2014   K 3.1* 01/01/2014   CL 102 01/01/2014   CO2 34* 01/01/2014   Lab Results  Component Value Date   WBC 6.1 12/29/2013   HGB 12.3 12/29/2013   HCT 38.6 12/29/2013   MCV 89.8 12/29/2013   PLT 334 12/29/2013   Lab Results  Component Value Date   ALT 23 12/28/2013   AST 23 12/28/2013   ALKPHOS 65 12/28/2013   BILITOT 0.3 12/28/2013   No results found for this basename: INR,  PROTIME    Current radiology studies No results found.  Disposition:  01-Home or Self Care       Future Appointments Provider Department Dept Phone   01/08/2014 9:00 AM Melvenia Needles, NP [facility] [phone]   01/22/2014 10:15 AM Irene Shipper, MD [facility] [phone]       Medication List    STOP taking these medications       fish oil-omega-3 fatty acids 1000 MG capsule      TAKE these medications       acetaminophen 500 MG tablet  Commonly known as:  TYLENOL  Take 500 mg by mouth every 6 (six) hours as needed for mild pain.     albuterol 108 (90 BASE) MCG/ACT inhaler  Commonly known as:  PROVENTIL HFA;VENTOLIN HFA  Inhale 2 puffs into the lungs every 4 (four) hours as needed. Shortness of breath     albuterol (2.5 MG/3ML) 0.083% nebulizer solution  Commonly known as:  PROVENTIL  Take 3 mLs (2.5 mg total) by nebulization every 6 (six) hours as needed for wheezing or shortness of breath.     aspirin 81 MG EC tablet  Take 81 mg by mouth daily. Swallow whole.     atorvastatin 40 MG tablet  Commonly known as:  LIPITOR  Take 40 mg by mouth daily.     CALCIUM + D PO  Take 1 tablet by mouth 3 (three) times daily.     Cinnamon 500 MG capsule  Take 1,000 mg by mouth 2 (two) times daily.     DEPLIN 7.5 MG Tabs  Take 7.5 mg by mouth daily.     esomeprazole 40 MG capsule  Commonly known as:  NEXIUM  Take 40 mg by mouth 2 (two) times daily.     FLUDROCORTISONE ACETATE PO  Take 0.1 mg by mouth 2 (two) times daily.     gabapentin 300 MG capsule  Commonly known as:  NEURONTIN  Take 300 mg by mouth 4 (four) times daily.     GLUCOSAMINE PO  Take 1 tablet by mouth 2 (two) times daily.     ibuprofen 200 MG tablet  Commonly known as:  ADVIL,MOTRIN  Take 200 mg by mouth every 6 (six) hours as needed.     isosorbide mononitrate 60 MG 24 hr tablet  Commonly known as:  IMDUR  Take 60 mg by mouth daily.     lidocaine 5 %  Commonly known as:  LIDODERM  Place 2 patches onto the skin as needed. Remove & Discard patch within 12 hours or as directed by MD     metoprolol succinate 50 MG 24 hr tablet  Commonly known as:   TOPROL-XL  Take 50 mg by mouth 2 (two) times daily. Take with or immediately following a meal.     mometasone-formoterol 200-5 MCG/ACT Aero  Commonly known as:  DULERA  Inhale 2 puffs into the lungs 2 (two) times daily.  montelukast 10 MG tablet  Commonly known as:  SINGULAIR  Take 10 mg by mouth at bedtime.     multivitamin with minerals Tabs tablet  Take 1 tablet by mouth daily.     NONFORMULARY OR COMPOUNDED ITEM  - Estradiol .02%  - 1 ML Prefilled Applicator  - Sig: apply vaginally twice a week  - #90 Day Supply with 4 refills     Oxycodone HCl 20 MG Tabs  Take 20 mg by mouth every 4 (four) hours as needed (pain.).     predniSONE 10 MG tablet  Commonly known as:  DELTASONE  Take 4 tabs  daily with food x 4 days, then 3 tabs daily x 4 days, then 2 tabs daily x 4 days, then 1 tab daily x4 days then stop. #40     promethazine 25 MG tablet  Commonly known as:  PHENERGAN  TAKE 1 TABLET (25 MG TOTAL) BY MOUTH AT BEDTIME AS NEEDED. FOR NAUSEA     promethazine-codeine 6.25-10 MG/5ML syrup  Commonly known as:  PHENERGAN with CODEINE  Take 5 mLs by mouth every 4 (four) hours as needed for cough.     sucralfate 1 GM/10ML suspension  Commonly known as:  CARAFATE  Take 1 g by mouth 4 (four) times daily -  with meals and at bedtime.     zolpidem 5 MG tablet  Commonly known as:  AMBIEN  Take 5 mg by mouth at bedtime.       Follow-up Information   Follow up with PARRETT,TAMMY, NP On 01/08/2014. (9:00 am)    Specialty:  Nurse Practitioner   Contact information:   [address]. [place] [address] [phone]        Discharged Condition: fair  Time spent on discharge greater than 40 minutes.  Vital signs at Discharge. Temp:  [98.3 F (36.8 C)-98.7 F (37.1 C)] 98.3 F (36.8 C) (01/30 0547) Pulse Rate:  [70-88] 70 (01/30 0547) Resp:  [18-20] 18 (01/30 0844) BP: (116-135)/(69-81) 117/78 mmHg (01/30 0547) SpO2:  [96 %-98 %] 97 % (01/30 0547) Weight:   [225 lb 5 oz (102.2 kg)] 225 lb 5 oz (102.2 kg) (01/30 0547) Office follow up Special Information or instructions. Follow up with NP Parrett.  She has a very complex medicine regimen and needs help to par these medications down.   Signed: Richardson Landry Minor ACNP [facility] Pager [phone] till 3 pm If no answer page [phone]  Favor reflux episode causing pneumonia, will need further GI evaluation Henrene Pastor)  Independently examined pt, evaluated data & formulated above discharge care plan with NP who scribed this note & edited by me.  Eric Nees V.   01/01/2014, 12:35 PM

## 2014-01-04 ENCOUNTER — Encounter: Payer: BC Managed Care – PPO | Admitting: Gynecology

## 2014-01-07 ENCOUNTER — Encounter: Payer: Self-pay | Admitting: Gynecology

## 2014-01-07 ENCOUNTER — Other Ambulatory Visit: Payer: Self-pay | Admitting: Gynecology

## 2014-01-07 ENCOUNTER — Ambulatory Visit (INDEPENDENT_AMBULATORY_CARE_PROVIDER_SITE_OTHER): Payer: Medicare Other | Admitting: Gynecology

## 2014-01-07 ENCOUNTER — Ambulatory Visit (INDEPENDENT_AMBULATORY_CARE_PROVIDER_SITE_OTHER): Payer: Medicare Other

## 2014-01-07 VITALS — BP 132/90

## 2014-01-07 DIAGNOSIS — N882 Stricture and stenosis of cervix uteri: Secondary | ICD-10-CM

## 2014-01-07 DIAGNOSIS — D259 Leiomyoma of uterus, unspecified: Secondary | ICD-10-CM

## 2014-01-07 DIAGNOSIS — N950 Postmenopausal bleeding: Secondary | ICD-10-CM

## 2014-01-07 DIAGNOSIS — N83339 Acquired atrophy of ovary and fallopian tube, unspecified side: Secondary | ICD-10-CM

## 2014-01-07 DIAGNOSIS — D251 Intramural leiomyoma of uterus: Secondary | ICD-10-CM

## 2014-01-07 DIAGNOSIS — N854 Malposition of uterus: Secondary | ICD-10-CM

## 2014-01-07 MED ORDER — MEGESTROL ACETATE 40 MG PO TABS: 40.0000 mg | ORAL_TABLET | Freq: Two times a day (BID) | ORAL | Status: DC

## 2014-01-07 MED ORDER — MEGESTROL ACETATE 40 MG PO TABS: ORAL_TABLET | ORAL | Status: DC

## 2014-01-07 NOTE — Patient Instructions (Signed)
Postmenopausal Bleeding °Postmenopausal bleeding is any bleeding a woman has after she has entered into menopause. Menopause is the end of a woman's fertile years. After menopause, a woman no longer ovulates or has menstrual periods.  °Postmenopausal bleeding can be caused by various things. Any type of postmenopausal bleeding, even if it appears to be a typical menstrual period, is concerning. This should be evaluated by your health care provider. Any treatment will depend on the cause of the bleeding. °HOME CARE INSTRUCTIONS °Monitor your condition for any changes. The following actions may help to alleviate any discomfort you are experiencing: °· Avoid the use of tampons and douches as directed by your health care provider.  °· Change your pads frequently. °· Get regular pelvic exams and Pap tests. °· Keep all follow-up appointments for diagnostic tests as directed by your health care provider. °SEEK MEDICAL CARE IF:  °· Your bleeding lasts more than 1 week. °· You have abdominal pain. °· You have bleeding with sexual intercourse. °SEEK IMMEDIATE MEDICAL CARE IF:  °· You have a fever, chills, headache, dizziness, muscle aches, and bleeding. °· You have severe pain with bleeding. °· You are passing blood clots. °· You have bleeding and need more than 1 pad an hour. °· You feel faint. °MAKE SURE YOU: °· Understand these instructions. °· Will watch your condition. °· Will get help right away if you are not doing well or get worse. °Document Released: 02/27/2006 Document Revised: 09/09/2013 Document Reviewed: 06/18/2013 °ExitCare® Patient Information ©2014 [employer]. ° °Endometrial Biopsy °Endometrial biopsy is a procedure in which a tissue sample is taken from inside the uterus. The tissue sample is then looked at under a microscope to see if the tissue is normal or abnormal. The endometrium is the lining of the uterus. This procedure helps determine where you are in your menstrual cycle and how hormone levels  are affecting the lining of the uterus. This procedure may also be used to evaluate uterine bleeding or to diagnose endometrial cancer, tuberculosis, polyps, or inflammatory conditions.  °LET YOUR HEALTH CARE PROVIDER KNOW ABOUT: °· Any allergies you have. °· All medicines you are taking, including vitamins, herbs, eye drops, creams, and over-the-counter medicines. °· Previous problems you or members of your family have had with the use of anesthetics. °· Any blood disorders you have. °· Previous surgeries you have had. °· Medical conditions you have. °· Possibility of pregnancy. °RISKS AND COMPLICATIONS °Generally, this is a safe procedure. However, as with any procedure, complications can occur. Possible complications include: °· Bleeding. °· Pelvic infection. °· Puncture of the uterine wall with the biopsy device (rare). °BEFORE THE PROCEDURE  °· Keep a record of your menstrual cycles as directed by your health care provider. You may need to schedule your procedure for a specific time in your cycle. °· You may want to bring a sanitary pad to wear home after the procedure. °· Arrange for someone to drive you home after the procedure if you will be given a medicine to help you relax (sedative).  °PROCEDURE  °· You may be given a sedative to relax you. °· You will lie on an exam table with your feet and legs supported as in a pelvic exam. °· Your health care provider will insert an instrument (speculum) into your vagina to see your cervix. °· Your cervix will be cleansed with an antiseptic solution. A medicine (local anesthetic) will be used to numb the cervix. °· A forceps instrument (tenaculum) will be used to hold your   cervix steady for the biopsy. °· A thin, rodlike instrument (uterine sound) will be inserted through your cervix to determine the length of your uterus and the location where the biopsy sample will be removed. °· A thin, flexible tube (catheter) will be inserted through your cervix and into the  uterus. The catheter is used to collect the biopsy sample from your endometrial tissue. °· The catheter and speculum will then be removed, and the tissue sample will be sent to a lab for examination. °AFTER THE PROCEDURE °· You will rest in a recovery area until you are ready to go home. °· You may have mild cramping and a small amount of vaginal bleeding for a few days after the procedure. This is normal. °· Make sure you find out how to get your test results. °Document Released: 03/22/2005 Document Revised: 07/22/2013 Document Reviewed: 05/06/2013 °ExitCare® Patient Information ©2014 [employer]. ° °

## 2014-01-07 NOTE — Progress Notes (Signed)
Patient presented to the office today for ongoing issue with postmenopausal bleeding. Please see last office note 07/29/2013.  Patient was seen in the office on 03/10/2013 as a result of referral from her primary provider in reference to an irregularity on the anterior cervical lip and patient stated she's had some spotting on and off at different x2 the year.  Patient has had 2 previous Cervical cerclages placed. Patient with anterior cervical hood as a result of 2 previous cerclages. Patient denies being a DES daughter. Patient stated recent Pap smear was normal as previous ones. At time of last visit there was stenosis of the cervical os and an endometrial biopsy was not able to be performed so she was placed on vaginal estrogen each bedtime for 2 weeks.  Patient was seen in the office last on 03/25/2013 whereby she was noted to have on pelvic exam an anterior cervical hood was noted but once again cervical os could not be found. Her ultrasound demonstrated the following:  Uterus measures 6.7 x 5.1 x 4.7 cm with endometrial stripe of 5.3 mm. Patient had 4 small intramural fibroids the largest one measuring 20 x 17 mm. Right and left ovary were normal a small calcified area in the left ovary measures 7 mm was noted. Several attempts were made with a lacrimal probe to identify a cervical os but was unsuccessful.  It was believed that her bleeding may have been an atrophic bleed. She was started on Provera 10 mg one daily for 10 days of each month for 3 months and return to the office for an ultrasound for measurement  And measurement of her endometrial stripe Patient stated that during the  3 months and she was on the Provera she did not bleed. When she stopped she continued to bleed again. As follows: September 15 and 16 October 13 and 14 November no. December 14 and 15 January 29 and 30th hole these were spotting February 1, 2, 3 was heavy bleeding    Ultrasound on 07/29/2013: Uterus 6.3 x  5.6 x 4.7 cm with endometrial stripe of 4.2 mm. Endometrium was avascular. Right ovary normal left ovary a solid focus 4 mm avascular was noted otherwise negative cul-de-sac.  Today once again we attempted to dilate her cervix in the  following fashion: A paracervical block with 1% lidocaine utilized was infiltrated at the 2 and 4 and 8 and 10:00 position for a total 8 cc. A lacrimal probe was utilized to begin the dilatation of the cervical canal followed by serial dilators to allow a sterile Pipelle to be introduced into the uterine cavity to obtain an endometrial biopsy. Tissue was obtained it was submitted for histological evaluation. Since we were able to dilate her cervix today a sonohysterogram was done with the following findings:  Sonohysterogram: Ultrasound today: Uterus measures 6.1 x 4.8 x 4.7 cm with endometrial stripe of 6.4 mm. A left intramural fibroid measuring 19 x 18 mm and 17 x 10 mm was noted. Heterogeneous myometrium was reported. Both right and left ovary were normal with no fluid in the cul-de-sac. After instilling sterile saline into the uterine cavity there was no filling defect noted and anterior endometrial wall was 1.2 mm posterior wall 1.4 mm excluding fluid.  Assessment/plan: Will await the results of endometrial biopsy. Patient will be started on Megace 40 mg twice a day for 10 days of each month for the next 4 months she will maintain a calendar and she will return to see me  in 6 months. Atrophic bleed?

## 2014-01-08 ENCOUNTER — Ambulatory Visit (INDEPENDENT_AMBULATORY_CARE_PROVIDER_SITE_OTHER)
Admission: RE | Admit: 2014-01-08 | Discharge: 2014-01-08 | Disposition: A | Discharge status: Home / Self Care | Payer: Medicare Other | Source: Ambulatory Visit | Attending: Adult Health | Admitting: Adult Health

## 2014-01-08 ENCOUNTER — Ambulatory Visit (INDEPENDENT_AMBULATORY_CARE_PROVIDER_SITE_OTHER): Payer: Medicare Other | Admitting: Adult Health

## 2014-01-08 ENCOUNTER — Encounter: Payer: Self-pay | Admitting: Adult Health

## 2014-01-08 VITALS — BP 116/76 | HR 73 | Temp 98.3°F | Ht 66.0 in | Wt 226.8 lb

## 2014-01-08 DIAGNOSIS — J189 Pneumonia, unspecified organism: Secondary | ICD-10-CM

## 2014-01-08 MED ORDER — PROMETHAZINE-CODEINE 6.25-10 MG/5ML PO SYRP: 5.0000 mL | ORAL_SOLUTION | ORAL | Status: DC | PRN

## 2014-01-08 NOTE — Patient Instructions (Signed)
Finish Prednisone taper as directed.  Mucinex DM twice daily as needed. For cough and congestion. Rinse after all inhalers. Continue a reflux diet. Followup with Dr. Elsworth Soho in 6 weeks and as needed. Please contact office for sooner follow up if symptoms do not improve or worsen or seek emergency care

## 2014-01-08 NOTE — Progress Notes (Signed)
   Subjective:    Patient ID: Shelby Anderson, female    DOB: [birth year], 58 y.o.   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  She was evaluated for CAD & has a loop recorder evaluation at [facility] for syncope.  EGD in 4/12 (Buccini) has shown peptic ulcer disease.  Spirometry did not show airway obstruction of asthma -No reversibility with bronchodilator  Her husband smokes but she denies any change in her environment. She has positive PPD.  CXR LLL nodular infx - resolved 5/13   12/23/13  C/o fever , rt pleuritic severe cp, non radiating, diarrhea x 1 d Entire family has been sick Vomiting x2   Loop recorder  Compliant with [place] sleep test 2013 - showed desatn, but could not afford O2  She denies seasonal allergies, post nasal drip or breakthrough heartburn on nexium  Last Steroid taper 01/2013 - No ER or hospital visits since Uses SABA 1 x /week  01/08/2014 [facility] follow up  Patient returns for a post hospital followup. She was admitted January 21 through January 30 for asthma exacerbation, community-acquired pneumonia involving the right upper lobe and RSV bronchitis. Patient was treated with aggressive IV antibiotics, and steroids, nebulized bronchodilators. She was also treated for aggressive reflux, and cough, prevention. Patient was discharged on prednisone taper. She has few days left on this taper. Since discharge. Patient reports she is improved with decreased cough, shortness of breath and wheezing. She does still have a lot of coughing at night. It keeps her up. Is requesting a refill on her cough medicine. Patient did have an abnormal barium esophagram on January 23 that showed a distal esophageal stricture. She has an upcoming appointment with GI for followup. Patient denies any  hemoptysis, orthopnea, PND, or leg swelling. Chest x-ray today shows no acute infiltrate.   Review of Systems  neg for any significant sore throat, dysphagia, itching, sneezing, nasal congestion or excess/ purulent secretions, fever, chills, sweats, unintended wt loss, pleuritic or exertional cp, hempoptysis, orthopnea pnd or change in chronic leg swelling. Also denies presyncope, palpitations, heartburn,  nausea or change in bowel or urinary habits, dysuria,hematuria, rash, arthralgias, visual complaints, headache, numbness weakness or ataxia.     Objective:   Physical Exam   Gen. Pleasant, obese, in no distress ENT - no lesions, no post nasal drip Neck: No JVD, no thyromegaly, no carotid bruits Lungs: no use of accessory muscles, no dullness to percussion, decreased without rales or rhonchi  Cardiovascular: Rhythm regular, heart sounds  normal, no murmurs or gallops, no peripheral edema Musculoskeletal: No deformities, no cyanosis or clubbing , no tremors  CXR 01/08/2014 NAD      Assessment & Plan:

## 2014-01-21 ENCOUNTER — Encounter: Payer: Self-pay | Admitting: Internal Medicine

## 2014-01-22 ENCOUNTER — Ambulatory Visit: Payer: Medicare Other | Admitting: Internal Medicine

## 2014-01-30 ENCOUNTER — Other Ambulatory Visit: Payer: Self-pay | Admitting: Gastroenterology

## 2014-01-30 ENCOUNTER — Other Ambulatory Visit: Payer: Self-pay | Admitting: Pulmonary Disease

## 2014-02-01 NOTE — Telephone Encounter (Signed)
PLEASE KEEP APPOINTMENT FOR FURTHER REFILLS

## 2014-02-22 ENCOUNTER — Ambulatory Visit (INDEPENDENT_AMBULATORY_CARE_PROVIDER_SITE_OTHER): Payer: Medicare Other | Admitting: Pulmonary Disease

## 2014-02-22 ENCOUNTER — Encounter: Payer: Self-pay | Admitting: Pulmonary Disease

## 2014-02-22 VITALS — BP 122/72 | HR 73 | Temp 98.3°F | Wt 234.2 lb

## 2014-02-22 DIAGNOSIS — J45901 Unspecified asthma with (acute) exacerbation: Secondary | ICD-10-CM

## 2014-02-22 DIAGNOSIS — G4733 Obstructive sleep apnea (adult) (pediatric): Secondary | ICD-10-CM

## 2014-02-22 DIAGNOSIS — K222 Esophageal obstruction: Secondary | ICD-10-CM

## 2014-02-22 MED ORDER — PREDNISONE 10 MG PO TABS: ORAL_TABLET | ORAL | Status: DC

## 2014-02-22 MED ORDER — MONTELUKAST SODIUM 10 MG PO TABS: 10.0000 mg | ORAL_TABLET | Freq: Every day | ORAL | Status: DC

## 2014-02-22 NOTE — Progress Notes (Signed)
   Subjective:    Patient ID: Shelby Anderson, female    DOB: 1955/12/09, 58 y.o.   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  She was evaluated for CAD & has a loop recorder evaluation at [facility] for syncope.  EGD in 03/2011 (Buccini) has shown peptic ulcer disease.  Spirometry did not show airway obstruction of asthma -No reversibility with bronchodilator  Her husband smokes.. She has positive PPD.  CXR LLL nodular infx - resolved 5/13  Home sleep test 07/2012 - showed desatn, but could not afford O2     02/22/2014   Chief Complaint  Patient presents with  . Follow-up    C/o nasal congestion, PND, prdo cough w/ yellow phlem x 5 days. Has god and bad days w/ breathing. Takes sudafed      She was admitted January 21 through January 30 for asthma exacerbation, community-acquired pneumonia involving the right upper lobe and RSV bronchitis. Patient was treated with aggressive IV antibiotics, and steroids, nebulized bronchodilators. She was also treated for aggressive reflux, and cough, prevention. She had an abnormal barium esophagram on January 23 that showed a distal esophageal stricture. She missed GI appt due to bad weather & has rescheduled.  Patient denies any hemoptysis, orthopnea, PND, or leg swelling.  FU CXR shows resolved infiltrate.    She denies seasonal allergies, post nasal drip or breakthrough heartburn on nexium  Uses SABA 1 x /week  Review of Systems neg for any significant sore throat, dysphagia, itching, sneezing, nasal congestion or excess/ purulent secretions, fever, chills, sweats, unintended wt loss, pleuritic or exertional cp, hempoptysis, orthopnea pnd or change in chronic leg swelling. Also denies presyncope, palpitations, heartburn, abdominal pain, nausea, vomiting, diarrhea or  change in bowel or urinary habits, dysuria,hematuria, rash, arthralgias, visual complaints, headache, numbness weakness or ataxia.     Objective:   Physical Exam  Gen. Pleasant, obese, in no distress ENT - no lesions, no post nasal drip Neck: No JVD, no thyromegaly, no carotid bruits Lungs: no use of accessory muscles, no dullness to percussion, decreased without rales or rhonchi  Cardiovascular: Rhythm regular, heart sounds  normal, no murmurs or gallops, no peripheral edema Musculoskeletal: No deformities, no cyanosis or clubbing , no tremors       Assessment & Plan:

## 2014-02-22 NOTE — Patient Instructions (Signed)
Refills on singulair Stay on dulera  Prednisone Rx will be pending in your pharmacy in case you get sick over a weekend- we discussed self taper  

## 2014-02-22 NOTE — Assessment & Plan Note (Addendum)
Refills on singulair Stay on dulera  Prednisone Rx will be pending in your pharmacy in case you get sick over a weekend- we discussed self taper

## 2014-02-23 NOTE — Assessment & Plan Note (Signed)
Rpt ONO vs in -lab study once acute issues resolved

## 2014-02-23 NOTE — Assessment & Plan Note (Signed)
Await GI input on esophageal stricture Is this contributing to her resp illness?

## 2014-02-25 ENCOUNTER — Encounter: Payer: Self-pay | Admitting: Internal Medicine

## 2014-02-25 ENCOUNTER — Ambulatory Visit (INDEPENDENT_AMBULATORY_CARE_PROVIDER_SITE_OTHER): Payer: Medicare Other | Admitting: Internal Medicine

## 2014-02-25 VITALS — BP 130/96 | HR 76 | Ht 65.0 in | Wt 231.0 lb

## 2014-02-25 DIAGNOSIS — R933 Abnormal findings on diagnostic imaging of other parts of digestive tract: Secondary | ICD-10-CM

## 2014-02-25 DIAGNOSIS — K222 Esophageal obstruction: Secondary | ICD-10-CM

## 2014-02-25 DIAGNOSIS — K219 Gastro-esophageal reflux disease without esophagitis: Secondary | ICD-10-CM

## 2014-02-25 MED ORDER — PROMETHAZINE HCL 25 MG PO TABS: 25.0000 mg | ORAL_TABLET | Freq: Every evening | ORAL | Status: DC | PRN

## 2014-02-25 NOTE — Patient Instructions (Signed)
You have been scheduled for an endoscopy with propofol. Please follow written instructions given to you at your visit today. If you use inhalers (even only as needed), please bring them with you on the day of your procedure. Your physician has requested that you go to www.startemmi.com and enter the access code given to you at your visit today. This web site gives a general overview about your procedure. However, you should still follow specific instructions given to you by our office regarding your preparation for the procedure.   We have sent the following medications to your pharmacy for you to pick up at your convenience: Phenergan

## 2014-02-25 NOTE — Progress Notes (Addendum)
HISTORY OF PRESENT ILLNESS:  Shelby Anderson is a 58 y.o. female with multiple medical problems who presents today for post hospitalization followup. She was admitted in January with pneumonia. She complained of problems with dysphagia. Barium swallow with tablet revealed distal esophageal stricture and hiatal hernia. 13 mm tablet passed. She has chronic GERD. Stays on Nexium. Also has chronic dysphagia which sounds in part due to dysmotility (liquids). Upper endoscopy with Dr. Sharlett Iles in May of 2014 was unremarkable. Prior colonoscopy in 2012 with Dr. Cristina Gong. She is accompanied today by her husband and grandchildren. States she is feeling better. Breathing at baseline. She saw her pulmonologist yesterday  REVIEW OF SYSTEMS:  All non-GI ROS negative except for sinus and allergy trouble, anxiety, back pain, cough, depression, headaches, heart murmur, irregular heartbeat, muscle pains, night sweats, shortness of breath, sleeping problems, sore throat  Past Medical History  Diagnosis Date  . Angina   . Heart murmur   . Asthma   . Mental disorder   . Coronary artery disease   . Hypertension   . Recurrent upper respiratory infection (URI)   . Tuberculosis     + TB SKIN TEST  . GERD (gastroesophageal reflux disease)   . Headache(784.0)   . Arthritis   . Anxiety   . Pneumonia   . Depression   . TMJ (dislocation of temporomandibular joint)   . Difficult intubation     "TMJ & woke up when they were still cutting on me"  . PONV (postoperative nausea and vomiting)   . Dysrhythmia   . Atrial fibrillation     h/o "AF w/frequent PVCs"  . Myocardial infarction 1980's & 1990;  . Shortness of breath 11/20/11    "all the time"  . Asthmatic bronchitis     "seasonal"  . Anemia     "chronic"  . H/O hiatal hernia   . Stomach ulcer     "3 small; found in 05/2011"  . History of migraines     "dx'd when I was in my teens"  . Fibromyalgia     "in my legs"  . Chronic back pain greater than  3 months duration     Past Surgical History  Procedure Laterality Date  . Carpal tunnel release  unknown    left hand  . Achilles tendon repair  1970's    left ankle  . Arthroscopic repair acl      left knee cap  . Cardiac catheterization      loop recorder  . Post ganglionectomy  1970's    "for migraine headaches"  . Tubal ligation  1980's  . Pouch string      "did this 3 times (once w/each pregnancy)"    Social History Shelby Anderson  reports that she has never smoked. She has never used smokeless tobacco. She reports that she does not drink alcohol or use illicit drugs.  family history includes Cancer in her brother, brother, paternal grandmother, and sister; Diabetes in her sister; Heart disease in her father, maternal grandfather, maternal grandmother, mother, paternal grandfather, paternal grandmother, and sister; Hypertension in her father, mother, sister, and sister; Malignant hyperthermia in her father. There is no history of Anesthesia problems, Hypotension, or Pseudochol deficiency.  Allergies  Allergen Reactions  . Oxycontin [Oxycodone Hcl] Anaphylaxis    hives, trouble breathing, tongue swelling (can't  tolerate oxycontin ) \  . Aspirin Other (See Comments)    GI Bleeds  . Darvocet [Propoxyphene N-Acetaminophen] Hives  . Lodine [Etodolac]  Hives and Swelling  . Nitroglycerin     IV-BP drops  . Penicillins Other (See Comments)    unknown  . Ultram [Tramadol Hcl] Hives  . Valium Other (See Comments)    Circulation problems       PHYSICAL EXAMINATION: Vital signs: BP 130/96  Pulse 76  Ht 5\' 5"  (1.651 m)  Wt 231 lb (104.781 kg)  BMI 38.44 kg/m2 General: Obese, Well-developed, well-nourished, no acute distress. Looks over than stated age 58: Sclerae are anicteric, conjunctiva pink. Oral mucosa intact Lungs: Clear except for slight crackles at left base Heart: Regular Abdomen: soft, obese, nontender, nondistended, no obvious ascites, no peritoneal  signs, normal bowel sounds. No organomegaly. Extremities: No edema Psychiatric: alert and oriented x3. Cooperative   ASSESSMENT:  #1. Chronic dysphagia. Seems to be combination of motility disorder and possible stricture as noted on esophagram. Do NOT feel that her esophageal issues contribute to her lung disease. #2. GERD. Controlled #3. Multiple medical problems. #4. Colonoscopy with Dr. Cristina Gong 12/29/2010. Distal hyperplastic polyps only. Followup in 10 years (2022) recommended (addended 03/23/2014).   PLAN:  #1. Continue PPI #2. Schedule outpatient upper endoscopy with esophageal dilation.The nature of the procedure, as well as the risks, benefits, and alternatives were carefully and thoroughly reviewed with the patient. Ample time for discussion and questions allowed. The patient understood, was satisfied, and agreed to proceed.

## 2014-03-03 ENCOUNTER — Encounter: Payer: Self-pay | Admitting: Internal Medicine

## 2014-03-12 ENCOUNTER — Telehealth: Payer: Self-pay | Admitting: Family

## 2014-03-12 ENCOUNTER — Ambulatory Visit (INDEPENDENT_AMBULATORY_CARE_PROVIDER_SITE_OTHER): Payer: Medicare Other | Admitting: Family

## 2014-03-12 ENCOUNTER — Other Ambulatory Visit: Payer: Self-pay | Admitting: Family

## 2014-03-12 ENCOUNTER — Encounter: Payer: Self-pay | Admitting: Family

## 2014-03-12 VITALS — BP 124/80 | HR 88 | Ht 65.0 in | Wt 228.0 lb

## 2014-03-12 DIAGNOSIS — K219 Gastro-esophageal reflux disease without esophagitis: Secondary | ICD-10-CM

## 2014-03-12 DIAGNOSIS — R200 Anesthesia of skin: Secondary | ICD-10-CM

## 2014-03-12 DIAGNOSIS — I10 Essential (primary) hypertension: Secondary | ICD-10-CM

## 2014-03-12 DIAGNOSIS — R209 Unspecified disturbances of skin sensation: Secondary | ICD-10-CM

## 2014-03-12 DIAGNOSIS — E7800 Pure hypercholesterolemia, unspecified: Secondary | ICD-10-CM

## 2014-03-12 DIAGNOSIS — IMO0002 Reserved for concepts with insufficient information to code with codable children: Secondary | ICD-10-CM

## 2014-03-12 DIAGNOSIS — E039 Hypothyroidism, unspecified: Secondary | ICD-10-CM

## 2014-03-12 LAB — CBC WITH DIFFERENTIAL/PLATELET
Basophils Absolute: 0 10*3/uL (ref 0.0–0.1)
Basophils Relative: 0.4 % (ref 0.0–3.0)
EOS ABS: 0.3 10*3/uL (ref 0.0–0.7)
Eosinophils Relative: 3.7 % (ref 0.0–5.0)
HCT: 39.1 % (ref 36.0–46.0)
Hemoglobin: 12.9 g/dL (ref 12.0–15.0)
Lymphocytes Relative: 24.6 % (ref 12.0–46.0)
Lymphs Abs: 2.1 10*3/uL (ref 0.7–4.0)
MCHC: 32.9 g/dL (ref 30.0–36.0)
MCV: 87.9 fl (ref 78.0–100.0)
MONO ABS: 0.5 10*3/uL (ref 0.1–1.0)
Monocytes Relative: 6.2 % (ref 3.0–12.0)
NEUTROS ABS: 5.6 10*3/uL (ref 1.4–7.7)
NEUTROS PCT: 65.1 % (ref 43.0–77.0)
Platelets: 295 10*3/uL (ref 150.0–400.0)
RBC: 4.44 Mil/uL (ref 3.87–5.11)
RDW: 14.6 % (ref 11.5–14.6)
WBC: 8.6 10*3/uL (ref 4.5–10.5)

## 2014-03-12 LAB — LIPID PANEL
Cholesterol: 142 mg/dL (ref 0–200)
HDL: 38.7 mg/dL — ABNORMAL LOW (ref >39.00)
LDL Cholesterol: 87 mg/dL (ref 0–99)
Total CHOL/HDL Ratio: 4
Triglycerides: 82 mg/dL (ref 0.0–149.0)
VLDL: 16.4 mg/dL (ref 0.0–40.0)

## 2014-03-12 LAB — BASIC METABOLIC PANEL
BUN: 9 mg/dL (ref 6–23)
CALCIUM: 9.2 mg/dL (ref 8.4–10.5)
CO2: 31 mEq/L (ref 19–32)
Chloride: 105 mEq/L (ref 96–112)
Creatinine, Ser: 0.6 mg/dL (ref 0.4–1.2)
GFR: 112.07 mL/min (ref >60.00)
Glucose, Bld: 99 mg/dL (ref 70–99)
POTASSIUM: 3.2 meq/L — AB (ref 3.5–5.1)
Sodium: 144 mEq/L (ref 135–145)

## 2014-03-12 LAB — HEPATIC FUNCTION PANEL
ALT: 21 U/L (ref 0–35)
AST: 23 U/L (ref 0–37)
Albumin: 3.4 g/dL — ABNORMAL LOW (ref 3.5–5.2)
Alkaline Phosphatase: 56 U/L (ref 39–117)
Bilirubin, Direct: 0.1 mg/dL (ref 0.0–0.3)
Total Bilirubin: 0.7 mg/dL (ref 0.3–1.2)
Total Protein: 7 g/dL (ref 6.0–8.3)

## 2014-03-12 LAB — TSH: TSH: 4.03 u[IU]/mL (ref 0.35–5.50)

## 2014-03-12 MED ORDER — POTASSIUM CHLORIDE ER 10 MEQ PO TBCR: 10.0000 meq | EXTENDED_RELEASE_TABLET | Freq: Every day | ORAL | Status: DC

## 2014-03-12 NOTE — Progress Notes (Signed)
Pre visit review using our clinic review tool, if applicable. No additional management support is needed unless otherwise documented below in the visit note. 

## 2014-03-12 NOTE — Telephone Encounter (Signed)
Relevant patient education assigned to patient using Emmi. ° °

## 2014-03-12 NOTE — Progress Notes (Signed)
Subjective:    Patient ID: Shelby Anderson, female    DOB: Apr 22, 1956, 58 y.o.   MRN: [id number]  HPI 58 year old WF, nonsmoker, is in today to be established. She has a history of Hypertension, Hypercholesterolemia, Asthma, OSA, and GERD. She is currently stable on all medications. She has chronic low back pain and is under the care of the pain clinic for lumbar radiculopathy.    Review of Systems  Constitutional: Negative.   HENT: Negative.   Respiratory: Negative.   Cardiovascular: Negative.   Gastrointestinal: Negative.   Endocrine: Negative.   Genitourinary: Negative.   Musculoskeletal: Positive for arthralgias.       Left knee tingling. Worse with laying down.   Skin: Negative.   Allergic/Immunologic: Negative.   Neurological: Negative.   Hematological: Negative.   Psychiatric/Behavioral: Negative.    Past Medical History  Diagnosis Date  . Angina   . Heart murmur   . Asthma   . Mental disorder   . Coronary artery disease   . Hypertension   . Recurrent upper respiratory infection (URI)   . Tuberculosis     + TB SKIN TEST  . GERD (gastroesophageal reflux disease)   . Headache(784.0)   . Arthritis   . Anxiety   . Pneumonia   . Depression   . TMJ (dislocation of temporomandibular joint)   . Difficult intubation     "TMJ & woke up when they were still cutting on me"  . PONV (postoperative nausea and vomiting)   . Dysrhythmia   . Atrial fibrillation     h/o "AF w/frequent PVCs"  . Myocardial infarction 1980's & 1990;  . Shortness of breath 11/20/11    "all the time"  . Asthmatic bronchitis     "seasonal"  . Anemia     "chronic"  . H/O hiatal hernia   . Stomach ulcer     "3 small; found in 05/2011"  . History of migraines     "dx'd when I was in my teens"  . Fibromyalgia     "in my legs"  . Chronic back pain greater than 3 months duration     History   Social History  . Marital Status: Married    Spouse Name: N/A    Number of Children: 3  .  Years of Education: N/A   Occupational History  . Retired Therapist, sports    Social History Main Topics  . Smoking status: Never Smoker   . Smokeless tobacco: Never Used  . Alcohol Use: No  . Drug Use: No  . Sexual Activity: Yes    Birth Control/ Protection: None   Other Topics Concern  . Not on file   Social History Narrative  . No narrative on file    Past Surgical History  Procedure Laterality Date  . Carpal tunnel release  unknown    left hand  . Achilles tendon repair  1970's    left ankle  . Arthroscopic repair acl      left knee cap  . Cardiac catheterization      loop recorder  . Post ganglionectomy  1970's    "for migraine headaches"  . Tubal ligation  1980's  . Pouch string      "did this 3 times (once w/each pregnancy)"    Family History  Problem Relation Age of Onset  . Malignant hyperthermia Father   . Hypertension Father   . Heart disease Father   . Anesthesia problems Neg Hx   .  Hypotension Neg Hx   . Pseudochol deficiency Neg Hx   . Hypertension Mother   . Heart disease Mother   . Cancer Sister     CERVICAL  . Hypertension Sister   . Cancer Brother     MELANOMA  . Heart disease Maternal Grandmother   . Heart disease Maternal Grandfather   . Cancer Paternal Grandmother     ?   Marland Kitchen Heart disease Paternal Grandmother   . Heart disease Paternal Grandfather   . Cancer Brother     LUNG  . Diabetes Sister   . Hypertension Sister   . Heart disease Sister     Allergies  Allergen Reactions  . Oxycontin [Oxycodone Hcl] Anaphylaxis    hives, trouble breathing, tongue swelling (can't  tolerate oxycontin ) \  . Aspirin Other (See Comments)    GI Bleeds  . Darvocet [Propoxyphene N-Acetaminophen] Hives  . Lodine [Etodolac] Hives and Swelling  . Nitroglycerin     IV-BP drops  . Penicillins Other (See Comments)    unknown  . Ultram [Tramadol Hcl] Hives  . Valium Other (See Comments)    Circulation problems    Current Outpatient Prescriptions on File  Prior to Visit  Medication Sig Dispense Refill  . albuterol (PROVENTIL HFA;VENTOLIN HFA) 108 (90 BASE) MCG/ACT inhaler Inhale 2 puffs into the lungs every 4 (four) hours as needed. Shortness of breath  1 Inhaler  3  . albuterol (PROVENTIL) (2.5 MG/3ML) 0.083% nebulizer solution Take 3 mLs (2.5 mg total) by nebulization every 6 (six) hours as needed for wheezing or shortness of breath.  75 mL  12  . aspirin 81 MG EC tablet Take 81 mg by mouth daily. Swallow whole.       Marland Kitchen atorvastatin (LIPITOR) 40 MG tablet Take 40 mg by mouth daily.      . Calcium Carbonate-Vitamin D (CALCIUM + D PO) Take 1 tablet by mouth 3 (three) times daily.        . Cinnamon 500 MG capsule Take 1,000 mg by mouth 2 (two) times daily.       Marland Kitchen esomeprazole (NEXIUM) 40 MG capsule Take 40 mg by mouth 2 (two) times daily.        . Eszopiclone 3 MG TABS 1 tab bedtime      . FLUDROCORTISONE ACETATE PO Take 0.1 mg by mouth 2 (two) times daily.      Marland Kitchen gabapentin (NEURONTIN) 400 MG capsule 1 capsule by mouth twice daily and 2 capsules at bedtime      . GLUCOSAMINE PO Take 1 tablet by mouth 2 (two) times daily.        Marland Kitchen ibuprofen (ADVIL,MOTRIN) 200 MG tablet Take 200 mg by mouth every 6 (six) hours as needed.      . isosorbide mononitrate (IMDUR) 60 MG 24 hr tablet Take 60 mg by mouth daily.        Marland Kitchen L-Methylfolate (DEPLIN) 7.5 MG TABS Take 7.5 mg by mouth daily.        Marland Kitchen lidocaine (LIDODERM) 5 % Place 2 patches onto the skin as needed. Remove & Discard patch within 12 hours or as directed by MD      . megestrol (MEGACE) 40 MG tablet Take one bid for 10 days of each month from the 10-15 for next four months  20 tablet  3  . metoprolol succinate (TOPROL-XL) 50 MG 24 hr tablet Take 50 mg by mouth 2 (two) times daily. Take with or immediately following a meal.      .  mometasone-formoterol (DULERA) 200-5 MCG/ACT AERO Inhale 2 puffs into the lungs 2 (two) times daily.  1 Inhaler  4  . montelukast (SINGULAIR) 10 MG tablet Take 1 tablet (10 mg  total) by mouth at bedtime.  30 tablet  5  . Multiple Vitamin (MULITIVITAMIN WITH MINERALS) TABS Take 1 tablet by mouth daily.        . NONFORMULARY OR COMPOUNDED ITEM Estradiol .02% 1 ML Prefilled Applicator Sig: apply vaginally twice a week #90 Day Supply with 4 refills  1 each  4  . Oxycodone HCl 20 MG TABS Take 20 mg by mouth every 4 (four) hours as needed (pain.).      Marland Kitchen promethazine (PHENERGAN) 25 MG tablet Take 1 tablet (25 mg total) by mouth at bedtime as needed for nausea or vomiting.  30 tablet  1  . sucralfate (CARAFATE) 1 GM/10ML suspension Take 1 g by mouth 4 (four) times daily -  with meals and at bedtime.      Marland Kitchen zolpidem (AMBIEN) 5 MG tablet Take 5 mg by mouth at bedtime.        No current facility-administered medications on file prior to visit.    BP 124/80  Pulse 88  Ht 5\' 5"  (1.651 m)  Wt 228 lb (103.42 kg)  BMI 37.94 kg/m2chart    Objective:   Physical Exam  Constitutional: She is oriented to person, place, and time. She appears well-developed and well-nourished.  HENT:  Right Ear: External ear normal.  Left Ear: External ear normal.  Nose: Nose normal.  Mouth/Throat: Oropharynx is clear and moist.  Neck: Normal range of motion. Neck supple.  Cardiovascular: Normal rate, regular rhythm and normal heart sounds.   Pulmonary/Chest: Effort normal and breath sounds normal.  Abdominal: Soft. Bowel sounds are normal.  Musculoskeletal: Normal range of motion.  Neurological: She is alert and oriented to person, place, and time.  Skin: Skin is warm and dry.  Psychiatric: She has a normal mood and affect.          Assessment & Plan:  Shelby Anderson was seen today for establish care.  Diagnoses and associated orders for this visit:  DDD (degenerative disc disease) - CBC with Differential  Unspecified essential hypertension - Basic Metabolic Panel - Hepatic Function Panel - CBC with Differential  Left leg numbness - Basic Metabolic Panel - CBC with  Differential  Pure hypercholesterolemia - Basic Metabolic Panel - Lipid Panel  Unspecified hypothyroidism - TSH  GERD (gastroesophageal reflux disease)   Call the office with any questions or concerns. Recheck in 4 months and sooner as needed.

## 2014-03-12 NOTE — Patient Instructions (Signed)
Lumbosacral Radiculopathy Lumbosacral radiculopathy is a pinched nerve or nerves in the low back (lumbosacral area). When this happens you may have weakness in your legs and may not be able to stand on your toes. You may have pain going down into your legs. There may be difficulties with walking normally. There are many causes of this problem. Sometimes this may happen from an injury, or simply from arthritis or boney problems. It may also be caused by other illnesses such as diabetes. If there is no improvement after treatment, further studies may be done to find the exact cause. DIAGNOSIS  X-rays may be needed if the problems become long standing. Electromyograms may be done. This study is one in which the working of nerves and muscles is studied. HOME CARE INSTRUCTIONS   Applications of ice packs may be helpful. Ice can be used in a plastic bag with a towel around it to prevent frostbite to skin. This may be used every 2 hours for 20 to 30 minutes, or as needed, while awake, or as directed by your caregiver.  Only take over-the-counter or prescription medicines for pain, discomfort, or fever as directed by your caregiver.  If physical therapy was prescribed, follow your caregiver's directions. SEEK IMMEDIATE MEDICAL CARE IF:   You have pain not controlled with medications.  You seem to be getting worse rather than better.  You develop increasing weakness in your legs.  You develop loss of bowel or bladder control.  You have difficulty with walking or balance, or develop clumsiness in the use of your legs.  You have a fever. MAKE SURE YOU:   Understand these instructions.  Will watch your condition.  Will get help right away if you are not doing well or get worse. Document Released: 11/19/2005 Document Revised: 02/11/2012 Document Reviewed: 07/09/2008 [employer] Patient Information 2014 [employer].  

## 2014-03-16 ENCOUNTER — Other Ambulatory Visit: Payer: Self-pay

## 2014-03-16 DIAGNOSIS — E876 Hypokalemia: Secondary | ICD-10-CM

## 2014-03-24 ENCOUNTER — Other Ambulatory Visit: Payer: Self-pay | Admitting: *Deleted

## 2014-03-30 ENCOUNTER — Encounter: Payer: Self-pay | Admitting: Internal Medicine

## 2014-03-30 ENCOUNTER — Ambulatory Visit (AMBULATORY_SURGERY_CENTER): Payer: Medicare Other | Admitting: Internal Medicine

## 2014-03-30 VITALS — BP 108/70 | HR 56 | Temp 97.3°F | Resp 16 | Ht 65.0 in | Wt 228.0 lb

## 2014-03-30 DIAGNOSIS — K219 Gastro-esophageal reflux disease without esophagitis: Secondary | ICD-10-CM

## 2014-03-30 DIAGNOSIS — R1310 Dysphagia, unspecified: Secondary | ICD-10-CM

## 2014-03-30 DIAGNOSIS — K222 Esophageal obstruction: Secondary | ICD-10-CM

## 2014-03-30 MED ORDER — SODIUM CHLORIDE 0.9 % IV SOLN: 500.0000 mL | INTRAVENOUS | Status: DC

## 2014-03-30 NOTE — Patient Instructions (Signed)
YOU HAD AN ENDOSCOPIC PROCEDURE TODAY AT [facility]: Refer to the procedure report that was given to you for any specific questions about what was found during the examination.  If the procedure report does not answer your questions, please call your gastroenterologist to clarify.  If you requested that your care partner not be given the details of your procedure findings, then the procedure report has been included in a sealed envelope for you to review at your convenience later.  YOU SHOULD EXPECT: Some feelings of bloating in the abdomen. Passage of more gas than usual.  Walking can help get rid of the air that was put into your GI tract during the procedure and reduce the bloating. If you had a lower endoscopy (such as a colonoscopy or flexible sigmoidoscopy) you may notice spotting of blood in your stool or on the toilet paper. If you underwent a bowel prep for your procedure, then you may not have a normal bowel movement for a few days.  DIET:  You may not have anything by mouth until 5pm, then you can have some water.   After the water,  if it's well tolerated, you may have a soft diet.  Please read the handouts carefully. We don't want you to choke.  ACTIVITY: Your care partner should take you home directly after the procedure.  You should plan to take it easy, moving slowly for the rest of the day.  You can resume normal activity the day after the procedure however you should NOT DRIVE or use heavy machinery for 24 hours (because of the sedation medicines used during the test).    SYMPTOMS TO REPORT IMMEDIATELY: A gastroenterologist can be reached at any hour.  During normal business hours, 8:30 AM to 5:00 PM Monday through Friday, call [phone].  After hours and on weekends, please call the GI answering service at [phone] who will take a message and have the physician on call contact you.   Following upper endoscopy (EGD)  Vomiting of blood or coffee ground  material  New chest pain or pain under the shoulder blades  Painful or persistently difficult swallowing  New shortness of breath  Fever of 100F or higher  Black, tarry-looking stools  FOLLOW UP: If any biopsies were taken you will be contacted by phone or by letter within the next 1-3 weeks.  Call your gastroenterologist if you have not heard about the biopsies in 3 weeks.  Our staff will call the home number listed on your records the next business day following your procedure to check on you and address any questions or concerns that you may have at that time regarding the information given to you following your procedure. This is a courtesy call and so if there is no answer at the home number and we have not heard from you through the emergency physician on call, we will assume that you have returned to your regular daily activities without incident.  SIGNATURES/CONFIDENTIALITY: You and/or your care partner have signed paperwork which will be entered into your electronic medical record.  These signatures attest to the fact that that the information above on your After Visit Summary has been reviewed and is understood.  Full responsibility of the confidentiality of this discharge information lies with you and/or your care-partner.

## 2014-03-30 NOTE — Progress Notes (Signed)
Lidocaine-40mg IV prior to Propofol InductionPropofol given over incremental dosages 

## 2014-03-30 NOTE — Progress Notes (Signed)
Pt. States she has a loop recorder left chest wall that record cardiac activity, send to [facility] q 3 months.  States heart rated drops and she goes in and out of Atrial fibrillation.

## 2014-03-30 NOTE — Progress Notes (Signed)
Called to room to assist during endoscopic procedure.  Patient ID and intended procedure confirmed with present staff. Received instructions for my participation in the procedure from the performing physician.  

## 2014-03-30 NOTE — Op Note (Signed)
[facility]  [address] [place], [id number]   ENDOSCOPY PROCEDURE REPORT  PATIENT: Shelby Anderson, Shelby Anderson  MR#: [id number] BIRTHDATE: [birth year] , 58  yrs. old GENDER: Female ENDOSCOPIST: Eustace Quail, MD REFERRED BY:  .  Self / Office PROCEDURE DATE:  03/30/2014 PROCEDURE:  EGD, diagnostic and Maloney dilation of esophagus   -63 French ASA CLASS:     Class III INDICATIONS:  Dysphagia.   stricture on esophagram MEDICATIONS: MAC sedation, administered by CRNA and propofol (Diprivan) 100mg  IV TOPICAL ANESTHETIC: none  DESCRIPTION OF PROCEDURE: After the risks benefits and alternatives of the procedure were thoroughly explained, informed consent was obtained.  The LB [id number] endoscope was introduced through the mouth and advanced to the second portion of the duodenum. Without limitations.  The instrument was slowly withdrawn as the mucosa was fully examined.     EXAM :The esophagus revealed ringlike stricture in the distal esophagus measuring approximately 15 mm.  no active inflammation. the stomach was normal.  the duodenum was normal.  Retroflexed views revealed no abnormalities.     The scope was then withdrawn from the patient and the procedure completed. THERAPY : 37 French Maloney dilator passed into the esophagus without resistance or heme. Tolerated well  COMPLICATIONS: There were no complications. ENDOSCOPIC IMPRESSION: 1. Distal esophageal stricture status post dilation -62 French 2.  Otherwise normal exam  RECOMMENDATIONS: 1.  Clear liquids until  5 PM, then soft foods rest of day.  Resume prior diet tomorrow. 2.  Continue current medications  REPEAT EXAM:  eSigned:  Eustace Quail, MD 03/30/2014 3:18 PM   RD:[id number], Sammie Bench MD and The Patient

## 2014-03-31 ENCOUNTER — Telehealth: Payer: Self-pay | Admitting: *Deleted

## 2014-03-31 ENCOUNTER — Other Ambulatory Visit: Payer: Medicare Other

## 2014-03-31 NOTE — Telephone Encounter (Signed)
  Follow up Call-  Call back number 03/30/2014 04/29/2013  Post procedure Call Back phone  # [phone]  Permission to leave phone message Yes Yes     Patient questions:  Do you have a fever, pain , or abdominal swelling? no Pain Score  0 *  Have you tolerated food without any problems? yes  Have you been able to return to your normal activities? yes  Do you have any questions about your discharge instructions: Diet   no Medications  no Follow up visit  no  Do you have questions or concerns about your Care? no  Actions: * If pain score is 4 or above: No action needed, pain <4.husband says pt's throat a little sore this morning ,no other problems. Encouraged him to tell pt to keep throat moist with liquids and to remain on soft foods today if throat continues to be sore and to call back if this doesn't improve as day goes on.

## 2014-04-01 ENCOUNTER — Other Ambulatory Visit (INDEPENDENT_AMBULATORY_CARE_PROVIDER_SITE_OTHER): Payer: Medicare Other

## 2014-04-01 DIAGNOSIS — E876 Hypokalemia: Secondary | ICD-10-CM

## 2014-04-01 LAB — BASIC METABOLIC PANEL
BUN: 8 mg/dL (ref 6–23)
CALCIUM: 8.8 mg/dL (ref 8.4–10.5)
CO2: 31 mEq/L (ref 19–32)
CREATININE: 0.6 mg/dL (ref 0.4–1.2)
Chloride: 106 mEq/L (ref 96–112)
GFR: 121.5 mL/min (ref >60.00)
Glucose, Bld: 112 mg/dL — ABNORMAL HIGH (ref 70–99)
POTASSIUM: 3.3 meq/L — AB (ref 3.5–5.1)
Sodium: 142 mEq/L (ref 135–145)

## 2014-04-02 ENCOUNTER — Other Ambulatory Visit: Payer: Self-pay | Admitting: Family

## 2014-04-02 MED ORDER — POTASSIUM CHLORIDE ER 20 MEQ PO TBCR: 1.0000 | EXTENDED_RELEASE_TABLET | Freq: Every day | ORAL | Status: DC

## 2014-05-03 ENCOUNTER — Telehealth: Payer: Self-pay | Admitting: Pulmonary Disease

## 2014-05-03 MED ORDER — PREDNISONE 10 MG PO TABS: ORAL_TABLET | ORAL | Status: DC

## 2014-05-03 MED ORDER — AZITHROMYCIN 250 MG PO TABS: 250.0000 mg | ORAL_TABLET | ORAL | Status: DC

## 2014-05-03 NOTE — Telephone Encounter (Signed)
Pt aware of medications per RA Have been sent to [facility] [place] [place]  Nothing further needed.

## 2014-05-03 NOTE — Telephone Encounter (Signed)
zpak Prednisone 10 mg tabs  Take 2 tabs daily with food x 5ds, then 1 tab daily with food x 5ds then STOP  

## 2014-05-03 NOTE — Telephone Encounter (Signed)
Spoke with the pt  She is c/o increased SOB, chest tightness, wheezing and prod cough with moderate yellow to green sputum x 3 days  She denies a fever or other co's  She declined appt for tomorrow and prefers something be called in Please advise, thanks! Allergies  Allergen Reactions  . Oxycontin [Oxycodone Hcl] Anaphylaxis    hives, trouble breathing, tongue swelling (can't  tolerate oxycontin ) \  . Aspirin Other (See Comments)    GI Bleeds  . Darvocet [Propoxyphene N-Acetaminophen] Hives  . Lodine [Etodolac] Hives and Swelling  . Nitroglycerin     IV-BP drops  . Penicillins Other (See Comments)    unknown  . Ultram [Tramadol Hcl] Hives  . Valium Other (See Comments)    Circulation problems

## 2014-05-24 ENCOUNTER — Ambulatory Visit: Payer: Medicare Other | Admitting: Adult Health

## 2014-05-31 ENCOUNTER — Telehealth: Payer: Self-pay | Admitting: Family

## 2014-05-31 MED ORDER — ATORVASTATIN CALCIUM 40 MG PO TABS: 40.0000 mg | ORAL_TABLET | Freq: Every day | ORAL | Status: DC

## 2014-05-31 NOTE — Telephone Encounter (Signed)
Pt is needing a refill of atorvastatin (LIPITOR) 40 MG tablet Pt is a new pt since 4/15 and she said padonda would refill her meds when they came due. 30 day w/ refills [facility] [address]

## 2014-05-31 NOTE — Telephone Encounter (Signed)
Done

## 2014-06-28 ENCOUNTER — Emergency Department (HOSPITAL_COMMUNITY)
Admission: EM | Admit: 2014-06-28 | Discharge: 2014-06-28 | Disposition: A | Discharge status: Home / Self Care | Payer: Medicare Other | Source: Home / Self Care | Attending: Family Medicine | Admitting: Family Medicine

## 2014-06-28 ENCOUNTER — Encounter (HOSPITAL_COMMUNITY): Payer: Self-pay | Admitting: Emergency Medicine

## 2014-06-28 ENCOUNTER — Emergency Department (INDEPENDENT_AMBULATORY_CARE_PROVIDER_SITE_OTHER): Payer: Medicare Other

## 2014-06-28 DIAGNOSIS — S60459A Superficial foreign body of unspecified finger, initial encounter: Secondary | ICD-10-CM

## 2014-06-28 DIAGNOSIS — S60352A Superficial foreign body of left thumb, initial encounter: Secondary | ICD-10-CM

## 2014-06-28 DIAGNOSIS — X58XXXA Exposure to other specified factors, initial encounter: Secondary | ICD-10-CM

## 2014-06-28 MED ORDER — TETANUS-DIPHTH-ACELL PERTUSSIS 5-2.5-18.5 LF-MCG/0.5 IM SUSP
0.5000 mL | Freq: Once | INTRAMUSCULAR | Status: AC
  Administered 2014-06-28: 0.5 mL via INTRAMUSCULAR

## 2014-06-28 MED ORDER — TETANUS-DIPHTH-ACELL PERTUSSIS 5-2.5-18.5 LF-MCG/0.5 IM SUSP
INTRAMUSCULAR | Status: AC
  Filled 2014-06-28: qty 0.5

## 2014-06-28 MED ORDER — AMOXICILLIN-POT CLAVULANATE 875-125 MG PO TABS: 1.0000 | ORAL_TABLET | Freq: Two times a day (BID) | ORAL | Status: DC

## 2014-06-28 MED ORDER — SODIUM BICARBONATE 4 % IV SOLN
INTRAVENOUS | Status: AC
  Filled 2014-06-28: qty 5

## 2014-06-28 NOTE — ED Notes (Signed)
Wound dressed by Dr. Burney Gauze with xeroform, sterile 4x4's and 1 " coban.

## 2014-06-28 NOTE — ED Notes (Signed)
Pulled a limb and got a splinter in base of L thumb today.  C/o pain and swelling.

## 2014-06-28 NOTE — ED Provider Notes (Signed)
CSN: [id number]     Arrival date & time 06/28/14  1805 History   First MD Initiated Contact with Patient 06/28/14 1900     Chief Complaint  Patient presents with  . Foreign Body in Skin   (Consider location/radiation/quality/duration/timing/severity/associated sxs/prior Treatment) Patient is a 58 y.o. female presenting with foreign body. The history is provided by the patient and the spouse.  Foreign Body Location:  Skin Suspected object:  Wood (thorn splinter lodged inthenar aspect of left hand.) Pain quality:  Sharp Pain severity:  Moderate Timing:  Constant Progression:  Unchanged Chronicity:  New   Past Medical History  Diagnosis Date  . Angina   . Heart murmur   . Mental disorder   . Coronary artery disease   . Hypertension   . Recurrent upper respiratory infection (URI)   . Tuberculosis     + TB SKIN TEST  . GERD (gastroesophageal reflux disease)   . Headache(784.0)   . Arthritis   . Anxiety   . Pneumonia   . Depression   . TMJ (dislocation of temporomandibular joint)   . Difficult intubation     "TMJ & woke up when they were still cutting on me"  . PONV (postoperative nausea and vomiting)   . Dysrhythmia   . Atrial fibrillation     h/o "AF w/frequent PVCs"  . Myocardial infarction 1980's & 1990;  . Shortness of breath 11/20/11    "all the time"  . Anemia     "chronic"  . H/O hiatal hernia   . Stomach ulcer     "3 small; found in 05/2011"  . History of migraines     "dx'd when I was in my teens"  . Fibromyalgia     "in my legs"  . Chronic back pain greater than 3 months duration   . Asthma     states Dr. Dagmar Hait told her she did not have asthma  . Asthmatic bronchitis     "seasonal"   Past Surgical History  Procedure Laterality Date  . Carpal tunnel release  unknown    left hand  . Achilles tendon repair  1970's    left ankle  . Arthroscopic repair acl      left knee cap  . Cardiac catheterization      loop recorder  . Post ganglionectomy   1970's    "for migraine headaches"  . Tubal ligation  1980's  . Pouch string  [phone]    "did this 3 times (once w/each pregnancy)"   Family History  Problem Relation Age of Onset  . Malignant hyperthermia Father   . Hypertension Father   . Heart disease Father   . Diabetes Father   . Cancer Father     skin  . Anesthesia problems Neg Hx   . Hypotension Neg Hx   . Pseudochol deficiency Neg Hx   . Hypertension Mother   . Heart disease Mother   . Cancer Sister     CERVICAL  . Hypertension Sister   . Cancer Brother     MELANOMA  . Heart disease Maternal Grandmother   . Heart disease Maternal Grandfather   . Cancer Paternal Grandmother     ?   Marland Kitchen Heart disease Paternal Grandmother   . Heart disease Paternal Grandfather   . Cancer Brother     LUNG  . Diabetes Sister   . Hypertension Sister   . Heart disease Sister    History  Substance Use Topics  . Smoking  status: Never Smoker   . Smokeless tobacco: Never Used  . Alcohol Use: No   OB History   Grav Para Term Preterm Abortions TAB SAB Ect Mult Living   5 3   2  2   3      Review of Systems  Constitutional: Negative.   Skin: Positive for wound.    Allergies  Oxycontin; Aspirin; Darvocet; Lodine; Nitroglycerin; Penicillins; Ultram; and Valium  Home Medications   Prior to Admission medications   Medication Sig Start Date End Date Taking? Authorizing Provider  albuterol (PROVENTIL HFA;VENTOLIN HFA) 108 (90 BASE) MCG/ACT inhaler Inhale 2 puffs into the lungs every 4 (four) hours as needed. Shortness of breath 08/20/13   Rigoberto Noel, MD  albuterol (PROVENTIL) (2.5 MG/3ML) 0.083% nebulizer solution Take 3 mLs (2.5 mg total) by nebulization every 6 (six) hours as needed for wheezing or shortness of breath. 01/01/14   Grace Bushy Minor, NP  amoxicillin-clavulanate (AUGMENTIN) 875-125 MG per tablet Take 1 tablet by mouth 2 (two) times daily. 06/28/14   Billy Fischer, MD  aspirin 81 MG EC tablet Take 81 mg by mouth daily.  Swallow whole.     Historical Provider, MD  atorvastatin (LIPITOR) 40 MG tablet Take 1 tablet (40 mg total) by mouth daily. 05/31/14   Timoteo Gaul, FNP  azithromycin (ZITHROMAX) 250 MG tablet Take 1 tablet (250 mg total) by mouth as directed. 05/03/14   Rigoberto Noel, MD  Calcium Carbonate-Vitamin D (CALCIUM + D PO) Take 1 tablet by mouth 3 (three) times daily.      Historical Provider, MD  Cinnamon 500 MG capsule Take 1,000 mg by mouth 2 (two) times daily.     Historical Provider, MD  esomeprazole (NEXIUM) 40 MG capsule Take 40 mg by mouth 2 (two) times daily.      Historical Provider, MD  Eszopiclone 3 MG TABS 1 tab bedtime 02/04/14   Historical Provider, MD  FLUDROCORTISONE ACETATE PO Take 0.1 mg by mouth 2 (two) times daily.    Historical Provider, MD  gabapentin (NEURONTIN) 400 MG capsule 1 capsule by mouth twice daily and 2 capsules at bedtime    Historical Provider, MD  GLUCOSAMINE PO Take 1 tablet by mouth 2 (two) times daily.      Historical Provider, MD  ibuprofen (ADVIL,MOTRIN) 200 MG tablet Take 200 mg by mouth every 6 (six) hours as needed.    Historical Provider, MD  isosorbide mononitrate (IMDUR) 60 MG 24 hr tablet Take 60 mg by mouth daily.      Historical Provider, MD  L-Methylfolate (DEPLIN) 7.5 MG TABS Take 7.5 mg by mouth daily.      Historical Provider, MD  lidocaine (LIDODERM) 5 % Place 2 patches onto the skin as needed. Remove & Discard patch within 12 hours or as directed by MD    Historical Provider, MD  megestrol (MEGACE) 40 MG tablet Take one bid for 10 days of each month from the 10-15 for next four months 01/07/14   Terrance Mass, MD  metoprolol succinate (TOPROL-XL) 50 MG 24 hr tablet Take 50 mg by mouth 2 (two) times daily. Take with or immediately following a meal.    Historical Provider, MD  mometasone-formoterol (DULERA) 200-5 MCG/ACT AERO Inhale 2 puffs into the lungs 2 (two) times daily. 08/20/13   Rigoberto Noel, MD  montelukast (SINGULAIR) 10 MG tablet Take 1  tablet (10 mg total) by mouth at bedtime. 02/22/14   Rigoberto Noel, MD  Multiple Vitamin (MULITIVITAMIN WITH MINERALS) TABS Take 1 tablet by mouth daily.      Historical Provider, MD  NONFORMULARY OR COMPOUNDED ITEM Estradiol .02% 1 ML Prefilled Applicator Sig: apply vaginally twice a week #90 Day Supply with 4 refills 03/10/13   Terrance Mass, MD  Oxycodone HCl 20 MG TABS Take 20 mg by mouth every 4 (four) hours as needed (pain.).    Historical Provider, MD  Potassium Chloride ER 20 MEQ TBCR Take 1 tablet by mouth daily. 04/02/14   Timoteo Gaul, FNP  predniSONE (DELTASONE) 10 MG tablet  02/22/14   Historical Provider, MD  predniSONE (DELTASONE) 10 MG tablet Take 2 tabs daily with food x 5ds, then 1 tab daily with food x 5ds then STOP 05/03/14   Rigoberto Noel, MD  promethazine (PHENERGAN) 25 MG tablet Take 1 tablet (25 mg total) by mouth at bedtime as needed for nausea or vomiting. 02/25/14   Irene Shipper, MD  sucralfate (CARAFATE) 1 GM/10ML suspension Take 1 g by mouth 4 (four) times daily -  with meals and at bedtime.    Historical Provider, MD  zolpidem (AMBIEN) 5 MG tablet Take 5 mg by mouth at bedtime.     Historical Provider, MD   BP 148/90  Pulse 78  Temp(Src) 98.3 F (36.8 C) (Oral)  Resp 12  SpO2 93% Physical Exam  Nursing note and vitals reviewed. Constitutional: She is oriented to person, place, and time. She appears well-developed and well-nourished.  Musculoskeletal: She exhibits tenderness.       Hands: Neurological: She is alert and oriented to person, place, and time.  Skin: Skin is warm and dry.    ED Course  Procedures (including critical care time) Labs Review Labs Reviewed - No data to display  Imaging Review Dg Finger Thumb Left  06/28/2014   CLINICAL DATA:  Splinter in the left thumb.  EXAM: LEFT THUMB 2+V  COMPARISON:  None.  FINDINGS: There are slight arthritic changes at the IP joint and first metacarpal phalangeal joint. There is soft tissue swelling  around the first metacarpal. There is no appreciable foreign body in the soft tissues.  IMPRESSION: No visible foreign body.  Soft tissue swelling.   Electronically Signed   By: Rozetta Nunnery M.D.   On: 06/28/2014 19:32   X-rays reviewed and report per radiologist.    MDM   1. Acute foreign body of left thumb, initial encounter    Dr Burney Gauze consulted for removal of splinter.    Billy Fischer, MD 06/28/14 2041

## 2014-06-28 NOTE — Consult Note (Signed)
Reason for Consult:left thumb deep foreign body Referring Physician: [name] is an 58 y.o. female.  HPI: s/p injury at home this pm with probable deep thorn in left thumb  Past Medical History  Diagnosis Date  . Angina   . Heart murmur   . Mental disorder   . Coronary artery disease   . Hypertension   . Recurrent upper respiratory infection (URI)   . Tuberculosis     + TB SKIN TEST  . GERD (gastroesophageal reflux disease)   . Headache(784.0)   . Arthritis   . Anxiety   . Pneumonia   . Depression   . TMJ (dislocation of temporomandibular joint)   . Difficult intubation     "TMJ & woke up when they were still cutting on me"  . PONV (postoperative nausea and vomiting)   . Dysrhythmia   . Atrial fibrillation     h/o "AF w/frequent PVCs"  . Myocardial infarction 1980's & 1990;  . Shortness of breath 11/20/11    "all the time"  . Anemia     "chronic"  . H/O hiatal hernia   . Stomach ulcer     "3 small; found in 05/2011"  . History of migraines     "dx'd when I was in my teens"  . Fibromyalgia     "in my legs"  . Chronic back pain greater than 3 months duration   . Asthma     states Dr. Dagmar Hait told her she did not have asthma  . Asthmatic bronchitis     "seasonal"    Past Surgical History  Procedure Laterality Date  . Carpal tunnel release  unknown    left hand  . Achilles tendon repair  1970's    left ankle  . Arthroscopic repair acl      left knee cap  . Cardiac catheterization      loop recorder  . Post ganglionectomy  1970's    "for migraine headaches"  . Tubal ligation  1980's  . Pouch string  [phone]    "did this 3 times (once w/each pregnancy)"    Family History  Problem Relation Age of Onset  . Malignant hyperthermia Father   . Hypertension Father   . Heart disease Father   . Diabetes Father   . Cancer Father     skin  . Anesthesia problems Neg Hx   . Hypotension Neg Hx   . Pseudochol deficiency Neg Hx   . Hypertension  Mother   . Heart disease Mother   . Cancer Sister     CERVICAL  . Hypertension Sister   . Cancer Brother     MELANOMA  . Heart disease Maternal Grandmother   . Heart disease Maternal Grandfather   . Cancer Paternal Grandmother     ?   Marland Kitchen Heart disease Paternal Grandmother   . Heart disease Paternal Grandfather   . Cancer Brother     LUNG  . Diabetes Sister   . Hypertension Sister   . Heart disease Sister     Social History:  reports that she has never smoked. She has never used smokeless tobacco. She reports that she does not drink alcohol or use illicit drugs.  Allergies:  Allergies  Allergen Reactions  . Oxycontin [Oxycodone Hcl] Anaphylaxis    hives, trouble breathing, tongue swelling (can't  tolerate oxycontin ) \  . Aspirin Other (See Comments)    GI Bleeds  . Darvocet [Propoxyphene N-Acetaminophen] Hives  .  Lodine [Etodolac] Hives and Swelling  . Nitroglycerin     IV-BP drops  . Penicillins Other (See Comments)    unknown  . Ultram [Tramadol Hcl] Hives  . Valium Other (See Comments)    Circulation problems    Medications: Scheduled:  No results found for this or any previous visit (from the past 48 hour(s)).  Dg Finger Thumb Left  06/28/2014   CLINICAL DATA:  Splinter in the left thumb.  EXAM: LEFT THUMB 2+V  COMPARISON:  None.  FINDINGS: There are slight arthritic changes at the IP joint and first metacarpal phalangeal joint. There is soft tissue swelling around the first metacarpal. There is no appreciable foreign body in the soft tissues.  IMPRESSION: No visible foreign body.  Soft tissue swelling.   Electronically Signed   By: Rozetta Nunnery M.D.   On: 06/28/2014 19:32    Review of Systems  All other systems reviewed and are negative.  Blood pressure 148/90, pulse 78, temperature 98.3 F (36.8 C), temperature source Oral, resp. rate 12, SpO2 93.00%. Physical Exam  Constitutional: She is oriented to person, place, and time. She appears well-developed and  well-nourished.  HENT:  Head: Normocephalic and atraumatic.  Cardiovascular: Normal rate.   Respiratory: Effort normal.  Musculoskeletal:       Left hand: She exhibits tenderness and swelling.  Left thumb swelling with probable thorn just proximal to radial side of mcpj  Neurological: She is alert and oriented to person, place, and time.  Skin: Skin is warm.  Psychiatric: She has a normal mood and affect. Her behavior is normal. Judgment and thought content normal.    Assessment/Plan: As above   Patient given 1% lidocaine with epi 1:100,000 10 cc block at bedside  Patient prepped and draped sterilely  Thorn removal performed  followup in my office this Thursday  Abx and pain meds as per urgent care staff  [facility] A 06/28/2014, 8:28 PM

## 2014-06-28 NOTE — Discharge Instructions (Signed)
Care and follow-up with dr Burney Gauze as instructed.

## 2014-06-29 NOTE — ED Notes (Signed)
Accessed for pharmacy

## 2014-07-02 ENCOUNTER — Other Ambulatory Visit: Payer: Self-pay | Admitting: Internal Medicine

## 2014-07-07 ENCOUNTER — Encounter: Payer: Self-pay | Admitting: Gynecology

## 2014-07-07 ENCOUNTER — Other Ambulatory Visit (HOSPITAL_COMMUNITY)
Admission: RE | Admit: 2014-07-07 | Discharge: 2014-07-07 | Disposition: A | Discharge status: Home / Self Care | Payer: Medicare Other | Source: Ambulatory Visit | Attending: Gynecology | Admitting: Gynecology

## 2014-07-07 ENCOUNTER — Ambulatory Visit (INDEPENDENT_AMBULATORY_CARE_PROVIDER_SITE_OTHER): Payer: Medicare Other | Admitting: Gynecology

## 2014-07-07 VITALS — BP 132/88

## 2014-07-07 DIAGNOSIS — Z1151 Encounter for screening for human papillomavirus (HPV): Secondary | ICD-10-CM | POA: Diagnosis present

## 2014-07-07 DIAGNOSIS — N950 Postmenopausal bleeding: Secondary | ICD-10-CM

## 2014-07-07 DIAGNOSIS — Z01419 Encounter for gynecological examination (general) (routine) without abnormal findings: Secondary | ICD-10-CM | POA: Diagnosis present

## 2014-07-07 DIAGNOSIS — Z124 Encounter for screening for malignant neoplasm of cervix: Secondary | ICD-10-CM

## 2014-07-07 NOTE — Patient Instructions (Signed)
CA-125 Tumor Marker CA 125 is a tumor marker that is used to help monitor the course of ovarian or endometrial cancer. PREPARATION FOR TEST No preparation is necessary. NORMAL FINDINGS Adults: 0-35 units/mL (0-35 kilounits)/L Ranges for normal findings may vary among different laboratories and hospitals. You should always check with your doctor after having lab work or other tests done to discuss the meaning of your test results and whether your values are considered within normal limits. MEANING OF TEST  Your caregiver will go over the test results with you and discuss the importance and meaning of your results, as well as treatment options and the need for additional tests if necessary. OBTAINING THE TEST RESULTS It is your responsibility to obtain your test results. Ask the lab or department performing the test when and how you will get your results. Document Released: 12/11/2004 Document Revised: 02/11/2012 Document Reviewed: 10/27/2008 [facility] Patient Information 2015 [place], [place]. This information is not intended to replace advice given to you by your health care provider. Make sure you discuss any questions you have with your health care provider.

## 2014-07-07 NOTE — Progress Notes (Signed)
58 year old patient that presents to the office for followup. Patient with history of postmenopausal bleeding. History of events as follows:  Patient was seen in the office on 03/10/2013 as a result of referral from her primary provider in reference to an irregularity on the anterior cervical lip and patient stated she's had some spotting on and off at different x2 the year.  Patient has had 2 previous Cervical cerclages placed. Patient with anterior cervical hood as a result of 2 previous cerclages. Patient denies being a DES daughter. Patient stated recent Pap smear was normal as previous ones. At time of last visit there was stenosis of the cervical os and an endometrial biopsy was not able to be performed so she was placed on vaginal estrogen each bedtime for 2 weeks.  Patient was seen in the office last on 03/25/2013 whereby she was noted to have on pelvic exam an anterior cervical hood was noted but once again cervical os could not be found. Her ultrasound demonstrated the following:  Uterus measures 6.7 x 5.1 x 4.7 cm with endometrial stripe of 5.3 mm. Patient had 4 small intramural fibroids the largest one measuring 20 x 17 mm. Right and left ovary were normal a small calcified area in the left ovary measures 7 mm was noted. Several attempts were made with a lacrimal probe to identify a cervical os but was unsuccessful  It was believed that her bleeding may have been an atrophic bleed. She was started on Provera 10 mg one daily for 10 days of each month for 3 months and return to the office for an ultrasound for measurement And measurement of her endometrial stripe  Patient stated that during the 3 months and she was on the Provera she did not bleed. When she stopped she continued to bleed again.  As follows:  September 15 and 16  October 13 and 14 November no.  December 14 and 15 January 29 and 30th  these were spotting  February 1, 2, 3 was heavy bleeding   Ultrasound on 07/29/2013:    Uterus 6.3 x 5.6 x 4.7 cm with endometrial stripe of 4.2 mm. Endometrium was avascular. Right ovary normal left ovary a solid focus 4 mm avascular was noted otherwise negative cul-de-sac.  We were able to successfully dilate her cervix after a paracervical block in the office on February 5 of this year an endometrial biopsy was done which demonstrated the following: Diagnosis Endometrium, biopsy, uterus - DISAGGREGATED FRAGMENTS OF ENDOMETRIAL GLANDS AND STROMA WITH FEATURES OF BREAKDOWN. - NEGATIVE FOR HYPERPLASIA OR MALIGNANCY. - BENIGN ENDOCERVICAL MUCOSA. - SQUAMOUS EPITHELIUM, NEGATIVE FOR INTRAEPITHELIAL LESION OR MALIGNANCY  Sonohysterogram done in the office demonstrated the following: Ultrasound today:  Uterus measures 6.1 x 4.8 x 4.7 cm with endometrial stripe of 6.4 mm. A left intramural fibroid measuring 19 x 18 mm and 17 x 10 mm was noted. Heterogeneous myometrium was reported. Both right and left ovary were normal with no fluid in the cul-de-sac. After instilling sterile saline into the uterine cavity there was no filling defect noted and anterior endometrial wall was 1.2 mm posterior wall 1.4 mm excluding fluid.  She was started on Megace 40 mg twice a day for 10 days of each month for 4 months and was asked to return today for followup. The working diagnosis was atrophic bleed?.  Patient brought a menstrual calendar whether results as follows: March spotted for 2 days April no bleeding Many spotting 2, 3, 4 June 6 and 7  light spotting In July 2 and third l light spotting August no spotting or bleeding  A Pap smear was done today. The cervix is then cleansed with Betadine solution and an endometrial biopsy with a sterile Pipelle was performed and the tissue submitted for histological evaluation.  Assessment/plan: Postmenopausal bleeding benign endometrial biopsy 6 months ago. Patient was placed on a progestational agent for 6 months had continued to spot and a cyclical  fashion. We were assuming that it was an atrophic bleed. She will return back next week for sonohysterogram. Abnormalities noted we had discussed about proceeding in taking patient to the operating room to do a vigorous D&C along with hysteroscopy. We'll check a CA 125 today.

## 2014-07-07 NOTE — Addendum Note (Signed)
Addended by: Thurnell Garbe A on: 07/07/2014 03:23 PM   Modules accepted: Orders

## 2014-07-08 LAB — CA 125: CA 125: 6.5 U/mL (ref 0.0–30.2)

## 2014-07-09 LAB — CYTOLOGY - PAP

## 2014-07-14 ENCOUNTER — Other Ambulatory Visit: Payer: Self-pay | Admitting: Gynecology

## 2014-07-14 DIAGNOSIS — N950 Postmenopausal bleeding: Secondary | ICD-10-CM

## 2014-07-19 ENCOUNTER — Ambulatory Visit (INDEPENDENT_AMBULATORY_CARE_PROVIDER_SITE_OTHER): Payer: Medicare Other | Admitting: Gynecology

## 2014-07-19 ENCOUNTER — Ambulatory Visit (INDEPENDENT_AMBULATORY_CARE_PROVIDER_SITE_OTHER): Payer: Medicare Other

## 2014-07-19 ENCOUNTER — Other Ambulatory Visit: Payer: Self-pay | Admitting: Gynecology

## 2014-07-19 DIAGNOSIS — D251 Intramural leiomyoma of uterus: Secondary | ICD-10-CM

## 2014-07-19 DIAGNOSIS — N950 Postmenopausal bleeding: Secondary | ICD-10-CM

## 2014-07-19 DIAGNOSIS — D259 Leiomyoma of uterus, unspecified: Secondary | ICD-10-CM

## 2014-07-19 DIAGNOSIS — N83339 Acquired atrophy of ovary and fallopian tube, unspecified side: Secondary | ICD-10-CM

## 2014-07-19 MED ORDER — PROGESTERONE MICRONIZED 200 MG PO CAPS
ORAL_CAPSULE | ORAL | Status: DC
Start: 2014-07-19 — End: 2015-01-10

## 2014-07-19 NOTE — Progress Notes (Signed)
Patient presented to the office today for her followup visit to our for evaluation for postmenopausal bleeding history as follows:  Patient was seen in the office on 03/10/2013 as a result of referral from her primary provider in reference to an irregularity on the anterior cervical lip and patient stated she's had some spotting on and off at different x2 the year.  Patient has had 2 previous Cervical cerclages placed. Patient with anterior cervical hood as a result of 2 previous cerclages. Patient denies being a DES daughter. Patient stated recent Pap smear was normal as previous ones. At time of last visit there was stenosis of the cervical os and an endometrial biopsy was not able to be performed so she was placed on vaginal estrogen each bedtime for 2 weeks.  Patient was seen in the office last on 03/25/2013 whereby she was noted to have on pelvic exam an anterior cervical hood was noted but once again cervical os could not be found. Her ultrasound demonstrated the following:  Uterus measures 6.7 x 5.1 x 4.7 cm with endometrial stripe of 5.3 mm. Patient had 4 small intramural fibroids the largest one measuring 20 x 17 mm. Right and left ovary were normal a small calcified area in the left ovary measures 7 mm was noted. Several attempts were made with a lacrimal probe to identify a cervical os but was unsuccessful  It was believed that her bleeding may have been an atrophic bleed. She was started on Provera 10 mg one daily for 10 days of each month for 3 months and return to the office for an ultrasound for measurement And measurement of her endometrial stripe  Patient stated that during the 3 months and she was on the Provera she did not bleed. When she stopped she continued to bleed again.  As follows:  September 15 and 16  October 13 and 14 November no.  December 14 and 15 January 29 and 30th these were spotting  February 1, 2, 3 was heavy bleeding   Ultrasound on 07/29/2013:  Uterus 6.3 x  5.6 x 4.7 cm with endometrial stripe of 4.2 mm. Endometrium was avascular. Right ovary normal left ovary a solid focus 4 mm avascular was noted otherwise negative cul-de-sac.  We were able to successfully dilate her cervix after a paracervical block in the office on February 5 of this year an endometrial biopsy was done which demonstrated the following:  Diagnosis  Endometrium, biopsy, uterus  - DISAGGREGATED FRAGMENTS OF ENDOMETRIAL GLANDS AND STROMA WITH FEATURES OF  BREAKDOWN.  - NEGATIVE FOR HYPERPLASIA OR MALIGNANCY.  - BENIGN ENDOCERVICAL MUCOSA.  - SQUAMOUS EPITHELIUM, NEGATIVE FOR INTRAEPITHELIAL LESION OR MALIGNANCY  Sonohysterogram on that day had demonstrated Sonohysterogram done in the office demonstrated the following:  Ultrasound today:  Uterus measures 6.1 x 4.8 x 4.7 cm with endometrial stripe of 6.4 mm. A left intramural fibroid measuring 19 x 18 mm and 17 x 10 mm was noted. Heterogeneous myometrium was reported. Both right and left ovary were normal with no fluid in the cul-de-sac. After instilling sterile saline into the uterine cavity there was no filling defect noted and anterior endometrial wall was 1.2 mm posterior wall 1.4 mm excluding fluid. Patient brought a menstrual calendar whether results as follows:  March spotted for 2 days  April no bleeding  Many spotting 2, 3, 4  June 6 and 7 light spotting  In July 2 and third l light spotting  August no spotting or bleeding  Assessment/plan: 58 year old  patient on no hormone replacement therapy with postmenopausal spotting. Negative workup to consist of endometrial biopsy as well as sonohysterogram. It appears bleed may have been atrophic in nature. Patient will be placed on Prometrium 200 mg one by mouth first 12 days of every month. We'll see her back in one year. She will maintain a calendar and ring with her at that office visit.

## 2014-07-28 ENCOUNTER — Encounter: Payer: Self-pay | Admitting: *Deleted

## 2014-08-01 ENCOUNTER — Other Ambulatory Visit: Payer: Self-pay | Admitting: Family

## 2014-08-04 ENCOUNTER — Other Ambulatory Visit: Payer: Self-pay | Admitting: Pulmonary Disease

## 2014-08-04 ENCOUNTER — Other Ambulatory Visit: Payer: Self-pay | Admitting: Internal Medicine

## 2014-08-06 ENCOUNTER — Other Ambulatory Visit: Payer: Self-pay

## 2014-08-06 MED ORDER — ESOMEPRAZOLE MAGNESIUM 40 MG PO CPDR: 40.0000 mg | DELAYED_RELEASE_CAPSULE | Freq: Every day | ORAL | Status: DC

## 2014-08-30 ENCOUNTER — Other Ambulatory Visit: Payer: Self-pay | Admitting: Family

## 2014-09-10 ENCOUNTER — Ambulatory Visit: Payer: Medicare Other | Admitting: Family

## 2014-09-15 ENCOUNTER — Encounter: Payer: Self-pay | Admitting: Family

## 2014-09-15 ENCOUNTER — Ambulatory Visit (INDEPENDENT_AMBULATORY_CARE_PROVIDER_SITE_OTHER): Payer: Medicare Other | Admitting: Family

## 2014-09-15 VITALS — BP 126/84 | HR 70 | Wt 231.0 lb

## 2014-09-15 DIAGNOSIS — I10 Essential (primary) hypertension: Secondary | ICD-10-CM

## 2014-09-15 DIAGNOSIS — Z1231 Encounter for screening mammogram for malignant neoplasm of breast: Secondary | ICD-10-CM

## 2014-09-15 DIAGNOSIS — E7800 Pure hypercholesterolemia, unspecified: Secondary | ICD-10-CM

## 2014-09-15 DIAGNOSIS — E876 Hypokalemia: Secondary | ICD-10-CM

## 2014-09-15 DIAGNOSIS — G4700 Insomnia, unspecified: Secondary | ICD-10-CM

## 2014-09-15 DIAGNOSIS — M1712 Unilateral primary osteoarthritis, left knee: Secondary | ICD-10-CM

## 2014-09-15 DIAGNOSIS — Z23 Encounter for immunization: Secondary | ICD-10-CM

## 2014-09-15 LAB — COMPREHENSIVE METABOLIC PANEL
ALT: 32 U/L (ref 0–35)
AST: 35 U/L (ref 0–37)
Albumin: 3.2 g/dL — ABNORMAL LOW (ref 3.5–5.2)
Alkaline Phosphatase: 79 U/L (ref 39–117)
BUN: 7 mg/dL (ref 6–23)
CO2: 27 meq/L (ref 19–32)
CREATININE: 0.7 mg/dL (ref 0.4–1.2)
Calcium: 9.2 mg/dL (ref 8.4–10.5)
Chloride: 103 mEq/L (ref 96–112)
GFR: 93.37 mL/min (ref >60.00)
Glucose, Bld: 89 mg/dL (ref 70–99)
Potassium: 4.4 mEq/L (ref 3.5–5.1)
Sodium: 142 mEq/L (ref 135–145)
Total Bilirubin: 0.5 mg/dL (ref 0.2–1.2)
Total Protein: 7.7 g/dL (ref 6.0–8.3)

## 2014-09-15 NOTE — Progress Notes (Signed)
Subjective:    Patient ID: Shelby Anderson, female    DOB: Jun 14, 1956, 58 y.o.   MRN: [id number]  HPI 58 year old white female with a history of hypertension, hyperlipidemia, and hypokalemia then today for recheck. Currently stable on medications without any concerns.  Reports left knee pain ongoing x6-8 months it seems to have been worse after a fall she had last December. Pain is worse with walking. Rates pain 6/10. History of osteoarthritis. Not currently taking medication.  Colonoscopy up-to-date. Last mammogram 2012.  Review of Systems  Constitutional: Negative.   HENT: Negative.   Respiratory: Negative.   Cardiovascular: Negative.   Gastrointestinal: Negative.   Endocrine: Negative.   Genitourinary: Negative.   Musculoskeletal: Negative.   Skin: Negative.   Allergic/Immunologic: Negative.   Neurological: Negative.   Hematological: Negative.   Psychiatric/Behavioral: Negative.    Past Medical History  Diagnosis Date  . Angina   . Heart murmur   . Mental disorder   . Coronary artery disease   . Hypertension   . Recurrent upper respiratory infection (URI)   . Tuberculosis     + TB SKIN TEST  . GERD (gastroesophageal reflux disease)   . Headache(784.0)   . Arthritis   . Anxiety   . Pneumonia   . Depression   . TMJ (dislocation of temporomandibular joint)   . Difficult intubation     "TMJ & woke up when they were still cutting on me"  . PONV (postoperative nausea and vomiting)   . Dysrhythmia   . Atrial fibrillation     h/o "AF w/frequent PVCs"  . Myocardial infarction 1980's & 1990;  . Shortness of breath 11/20/11    "all the time"  . Anemia     "chronic"  . H/O hiatal hernia   . Stomach ulcer     "3 small; found in 05/2011"  . History of migraines     "dx'd when I was in my teens"  . Fibromyalgia     "in my legs"  . Chronic back pain greater than 3 months duration   . Asthma     states Dr. Dagmar Hait told her she did not have asthma  . Asthmatic  bronchitis     "seasonal"    History   Social History  . Marital Status: Married    Spouse Name: N/A    Number of Children: 3  . Years of Education: N/A   Occupational History  . Retired Therapist, sports    Social History Main Topics  . Smoking status: Never Smoker   . Smokeless tobacco: Never Used  . Alcohol Use: No  . Drug Use: No  . Sexual Activity: Yes    Birth Control/ Protection: Surgical   Other Topics Concern  . Not on file   Social History Narrative  . No narrative on file    Past Surgical History  Procedure Laterality Date  . Carpal tunnel release  unknown    left hand  . Achilles tendon repair  1970's    left ankle  . Arthroscopic repair acl      left knee cap  . Cardiac catheterization      loop recorder  . Post ganglionectomy  1970's    "for migraine headaches"  . Tubal ligation  1980's  . Pouch string  [phone]    "did this 3 times (once w/each pregnancy)"    Family History  Problem Relation Age of Onset  . Malignant hyperthermia Father   . Hypertension Father   .  Heart disease Father   . Diabetes Father   . Cancer Father     skin  . Anesthesia problems Neg Hx   . Hypotension Neg Hx   . Pseudochol deficiency Neg Hx   . Hypertension Mother   . Heart disease Mother   . Cancer Sister     CERVICAL  . Hypertension Sister   . Cancer Brother     MELANOMA  . Heart disease Maternal Grandmother   . Heart disease Maternal Grandfather   . Cancer Paternal Grandmother     ?   Marland Kitchen Heart disease Paternal Grandmother   . Heart disease Paternal Grandfather   . Cancer Brother     LUNG  . Diabetes Sister   . Hypertension Sister   . Heart disease Sister   . Cancer Sister   . Cancer Brother     Allergies  Allergen Reactions  . Oxycontin [Oxycodone Hcl] Anaphylaxis    hives, trouble breathing, tongue swelling (can't  tolerate oxycontin ) \  . Aspirin Other (See Comments)    GI Bleeds  . Darvocet [Propoxyphene N-Acetaminophen] Hives  . Lodine [Etodolac]  Hives and Swelling  . Nitroglycerin     IV-BP drops  . Penicillins Other (See Comments)    unknown  . Ultram [Tramadol Hcl] Hives  . Valium Other (See Comments)    Circulation problems    Current Outpatient Prescriptions on File Prior to Visit  Medication Sig Dispense Refill  . albuterol (PROVENTIL HFA;VENTOLIN HFA) 108 (90 BASE) MCG/ACT inhaler Inhale 2 puffs into the lungs every 4 (four) hours as needed. Shortness of breath  1 Inhaler  3  . albuterol (PROVENTIL) (2.5 MG/3ML) 0.083% nebulizer solution Take 3 mLs (2.5 mg total) by nebulization every 6 (six) hours as needed for wheezing or shortness of breath.  75 mL  12  . aspirin 81 MG EC tablet Take 81 mg by mouth daily. Swallow whole.       Marland Kitchen atorvastatin (LIPITOR) 40 MG tablet Take 1 tablet (40 mg total) by mouth daily.  30 tablet  5  . Calcium Carbonate-Vitamin D (CALCIUM + D PO) Take 1 tablet by mouth 3 (three) times daily.        . Cinnamon 500 MG capsule Take 1,000 mg by mouth 2 (two) times daily.       . Eszopiclone 3 MG TABS 1 tab bedtime      . FLUDROCORTISONE ACETATE PO Take 0.1 mg by mouth 2 (two) times daily.      Marland Kitchen gabapentin (NEURONTIN) 400 MG capsule 1 capsule by mouth twice daily and 2 capsules at bedtime      . GLUCOSAMINE PO Take 1 tablet by mouth 2 (two) times daily.        Marland Kitchen ibuprofen (ADVIL,MOTRIN) 200 MG tablet Take 200 mg by mouth every 6 (six) hours as needed.      . isosorbide mononitrate (IMDUR) 60 MG 24 hr tablet Take 60 mg by mouth daily.        Marland Kitchen KLOR-CON M10 10 MEQ tablet TAKE 1 TABLET EVERY DAY  30 tablet  0  . L-Methylfolate (DEPLIN) 7.5 MG TABS Take 7.5 mg by mouth daily.        Marland Kitchen lidocaine (LIDODERM) 5 % Place 2 patches onto the skin as needed. Remove & Discard patch within 12 hours or as directed by MD      . metoprolol succinate (TOPROL-XL) 50 MG 24 hr tablet Take 50 mg by mouth 2 (two) times  daily. Take with or immediately following a meal.      . mometasone-formoterol (DULERA) 200-5 MCG/ACT AERO  Inhale 2 puffs into the lungs 2 (two) times daily.  1 Inhaler  4  . montelukast (SINGULAIR) 10 MG tablet Take 1 tablet (10 mg total) by mouth at bedtime.  30 tablet  5  . Multiple Vitamin (MULITIVITAMIN WITH MINERALS) TABS Take 1 tablet by mouth daily.        . NONFORMULARY OR COMPOUNDED ITEM Estradiol .02% 1 ML Prefilled Applicator Sig: apply vaginally twice a week #90 Day Supply with 4 refills  1 each  4  . Oxycodone HCl 20 MG TABS Take 20 mg by mouth every 4 (four) hours as needed (pain.).      Marland Kitchen progesterone (PROMETRIUM) 200 MG capsule Take one tablet the first 12 days of the month  36 capsule  4  . promethazine (PHENERGAN) 25 MG tablet TAKE 1 TABLET (25 MG TOTAL) BY MOUTH AT BEDTIME AS NEEDED FOR NAUSEA OR VOMITING.  30 tablet  1  . sucralfate (CARAFATE) 1 GM/10ML suspension Take 1 g by mouth 4 (four) times daily -  with meals and at bedtime.      Marland Kitchen zolpidem (AMBIEN) 5 MG tablet Take 5 mg by mouth at bedtime.       . [DISCONTINUED] Potassium Chloride ER 20 MEQ TBCR Take 1 tablet by mouth daily.  30 tablet  3   No current facility-administered medications on file prior to visit.    BP 126/84  Pulse 70  Wt 231 lb (104.781 kg)chart    Objective:   Physical Exam  Constitutional: She is oriented to person, place, and time. She appears well-developed and well-nourished.  HENT:  Right Ear: External ear normal.  Left Ear: External ear normal.  Nose: Nose normal.  Mouth/Throat: Oropharynx is clear and moist.  Neck: Normal range of motion. Neck supple.  Cardiovascular: Normal rate, regular rhythm and normal heart sounds.   Pulmonary/Chest: Effort normal and breath sounds normal.  Abdominal: Bowel sounds are normal.  Musculoskeletal: Normal range of motion.  Neurological: She is alert and oriented to person, place, and time.  Skin: Skin is warm and dry.  Psychiatric: She has a normal mood and affect.          Assessment & Plan:  Earnest was seen today for follow-up.  Diagnoses  and associated orders for this visit:  Insomnia  Pure hypercholesterolemia - CMP  Essential hypertension - CMP  Hypokalemia - CMP  Screening mammogram for high-risk patient - MM Digital Screening; Future  Primary osteoarthritis of left knee    Schedule complete physical exam in 6 months. Continue current medications. Diclofenac as needed for osteoarthritis. Currently off any questions or concerns.

## 2014-09-15 NOTE — Progress Notes (Signed)
Pre visit review using our clinic review tool, if applicable. No additional management support is needed unless otherwise documented below in the visit note. 

## 2014-09-15 NOTE — Patient Instructions (Signed)
Cardiac Diet This diet can help prevent heart disease and stroke. Many factors influence your heart health, including eating and exercise habits. Coronary risk rises a lot with abnormal blood fat (lipid) levels. Cardiac meal planning includes limiting unhealthy fats, increasing healthy fats, and making other small dietary changes. General guidelines are as follows:  Adjust calorie intake to reach and maintain desirable body weight.  Limit total fat intake to less than 30% of total calories. Saturated fat should be less than 7% of calories.  Saturated fats are found in animal products and in some vegetable products. Saturated vegetable fats are found in coconut oil, cocoa butter, palm oil, and palm kernel oil. Read labels carefully to avoid these products as much as possible. Use butter in moderation. Choose tub margarines and oils that have 2 grams of fat or less. Good cooking oils are canola and olive oils.  Practice low-fat cooking techniques. Do not fry food. Instead, broil, bake, boil, steam, grill, roast on a rack, stir-fry, or microwave it. Other fat reducing suggestions include:  Remove the skin from poultry.  Remove all visible fat from meats.  Skim the fat off stews, soups, and gravies before serving them.  Steam vegetables in water or broth instead of sauting them in fat.  Avoid foods with trans fat (or hydrogenated oils), such as commercially fried foods and commercially baked goods. Commercial shortening and deep-frying fats will contain trans fat.  Increase intake of fruits, vegetables, whole grains, and legumes to replace foods high in fat.  Increase consumption of nuts, legumes, and seeds to at least 4 servings weekly. One serving of a legume equals  cup, and 1 serving of nuts or seeds equals  cup.  Choose whole grains more often. Have 3 servings per day (a serving is 1 ounce [oz]).  Eat 4 to 5 servings of vegetables per day. A serving of vegetables is 1 cup of raw leafy  vegetables;  cup of raw or cooked cut-up vegetables;  cup of vegetable juice.  Eat 4 to 5 servings of fruit per day. A serving of fruit is 1 medium whole fruit;  cup of dried fruit;  cup of fresh, frozen, or canned fruit;  cup of 100% fruit juice.  Increase your intake of dietary fiber to 20 to 30 grams per day. Insoluble fiber may help lower your risk of heart disease and may help curb your appetite.  Soluble fiber binds cholesterol to be removed from the blood. Foods high in soluble fiber are dried beans, citrus fruits, oats, apples, bananas, broccoli, Brussels sprouts, and eggplant.  Try to include foods fortified with plant sterols or stanols, such as yogurt, breads, juices, or margarines. Choose several fortified foods to achieve a daily intake of 2 to 3 grams of plant sterols or stanols.  Foods with omega-3 fats can help reduce your risk of heart disease. Aim to have a 3.5 oz portion of fatty fish twice per week, such as salmon, mackerel, albacore tuna, sardines, lake trout, or herring. If you wish to take a fish oil supplement, choose one that contains 1 gram of both DHA and EPA.  Limit processed meats to 2 servings (3 oz portion) weekly.  Limit the sodium in your diet to 1500 milligrams (mg) per day. If you have high blood pressure, talk to a registered dietitian about a DASH (Dietary Approaches to Stop Hypertension) eating plan.  Limit sweets and beverages with added sugar, such as soda, to no more than 5 servings per week. One   serving is:   1 tablespoon sugar.  1 tablespoon jelly or jam.   cup sorbet.  1 cup lemonade.   cup regular soda. CHOOSING FOODS Starches  Allowed: Breads: All kinds (wheat, rye, raisin, white, oatmeal, Italian, French, and English muffin bread). Low-fat rolls: English muffins, frankfurter and hamburger buns, bagels, pita bread, tortillas (not fried). Pancakes, waffles, biscuits, and muffins made with recommended oil.  Avoid: Products made with  saturated or trans fats, oils, or whole milk products. Butter rolls, cheese breads, croissants. Commercial doughnuts, muffins, sweet rolls, biscuits, waffles, pancakes, store-bought mixes. Crackers  Allowed: Low-fat crackers and snacks: Animal, graham, rye, saltine (with recommended oil, no lard), oyster, and matzo crackers. Bread sticks, melba toast, rusks, flatbread, pretzels, and light popcorn.  Avoid: High-fat crackers: cheese crackers, butter crackers, and those made with coconut, palm oil, or trans fat (hydrogenated oils). Buttered popcorn. Cereals  Allowed: Hot or cold whole-grain cereals.  Avoid: Cereals containing coconut, hydrogenated vegetable fat, or animal fat. Potatoes / Pasta / Rice  Allowed: All kinds of potatoes, rice, and pasta (such as macaroni, spaghetti, and noodles).  Avoid: Pasta or rice prepared with cream sauce or high-fat cheese. Chow mein noodles, French fries. Vegetables  Allowed: All vegetables and vegetable juices.  Avoid: Fried vegetables. Vegetables in cream, butter, or high-fat cheese sauces. Limit coconut. Fruit in cream or custard. Protein  Allowed: Limit your intake of meat, seafood, and poultry to no more than 6 oz (cooked weight) per day. All lean, well-trimmed beef, veal, pork, and lamb. All chicken and turkey without skin. All fish and shellfish. Wild game: wild duck, rabbit, pheasant, and venison. Egg whites or low-cholesterol egg substitutes may be used as desired. Meatless dishes: recipes with dried beans, peas, lentils, and tofu (soybean curd). Seeds and nuts: all seeds and most nuts.  Avoid: Prime grade and other heavily marbled and fatty meats, such as short ribs, spare ribs, rib eye roast or steak, frankfurters, sausage, bacon, and high-fat luncheon meats, mutton. Caviar. Commercially fried fish. Domestic duck, goose, venison sausage. Organ meats: liver, gizzard, heart, chitterlings, brains, kidney, sweetbreads. Dairy  Allowed: Low-fat  cheeses: nonfat or low-fat cottage cheese (1% or 2% fat), cheeses made with part skim milk, such as mozzarella, farmers, string, or ricotta. (Cheeses should be labeled no more than 2 to 6 grams fat per oz.). Skim (or 1%) milk: liquid, powdered, or evaporated. Buttermilk made with low-fat milk. Drinks made with skim or low-fat milk or cocoa. Chocolate milk or cocoa made with skim or low-fat (1%) milk. Nonfat or low-fat yogurt.  Avoid: Whole milk cheeses, including colby, cheddar, muenster, Monterey Jack, Havarti, Brie, Camembert, American, Swiss, and blue. Creamed cottage cheese, cream cheese. Whole milk and whole milk products, including buttermilk or yogurt made from whole milk, drinks made from whole milk. Condensed milk, evaporated whole milk, and 2% milk. Soups and Combination Foods  Allowed: Low-fat low-sodium soups: broth, dehydrated soups, homemade broth, soups with the fat removed, homemade cream soups made with skim or low-fat milk. Low-fat spaghetti, lasagna, chili, and Spanish rice if low-fat ingredients and low-fat cooking techniques are used.  Avoid: Cream soups made with whole milk, cream, or high-fat cheese. All other soups. Desserts and Sweets  Allowed: Sherbet, fruit ices, gelatins, meringues, and angel food cake. Homemade desserts with recommended fats, oils, and milk products. Jam, jelly, honey, marmalade, sugars, and syrups. Pure sugar candy, such as gum drops, hard candy, jelly beans, marshmallows, mints, and small amounts of dark chocolate.  Avoid: Commercially prepared   cakes, pies, cookies, frosting, pudding, or mixes for these products. Desserts containing whole milk products, chocolate, coconut, lard, palm oil, or palm kernel oil. Ice cream or ice cream drinks. Candy that contains chocolate, coconut, butter, hydrogenated fat, or unknown ingredients. Buttered syrups. Fats and Oils  Allowed: Vegetable oils: safflower, sunflower, corn, soybean, cottonseed, sesame, canola, olive,  or peanut. Non-hydrogenated margarines. Salad dressing or mayonnaise: homemade or commercial, made with a recommended oil. Low or nonfat salad dressing or mayonnaise.  Limit added fats and oils to 6 to 8 tsp per day (includes fats used in cooking, baking, salads, and spreads on bread). Remember to count the "hidden fats" in foods.  Avoid: Solid fats and shortenings: butter, lard, salt pork, bacon drippings. Gravy containing meat fat, shortening, or suet. Cocoa butter, coconut. Coconut oil, palm oil, palm kernel oil, or hydrogenated oils: these ingredients are often used in bakery products, nondairy creamers, whipped toppings, candy, and commercially fried foods. Read labels carefully. Salad dressings made of unknown oils, sour cream, or cheese, such as blue cheese and Roquefort. Cream, all kinds: half-and-half, light, heavy, or whipping. Sour cream or cream cheese (even if "light" or low-fat). Nondairy cream substitutes: coffee creamers and sour cream substitutes made with palm, palm kernel, hydrogenated oils, or coconut oil. Beverages  Allowed: Coffee (regular or decaffeinated), tea. Diet carbonated beverages, mineral water. Alcohol: Check with your caregiver. Moderation is recommended.  Avoid: Whole milk, regular sodas, and juice drinks with added sugar. Condiments  Allowed: All seasonings and condiments. Cocoa powder. "Cream" sauces made with recommended ingredients.  Avoid: Carob powder made with hydrogenated fats. SAMPLE MENU Breakfast   cup orange juice   cup oatmeal  1 slice toast  1 tsp margarine  1 cup skim milk Lunch  Turkey sandwich with 2 oz turkey, 2 slices bread  Lettuce and tomato slices  Fresh fruit  Carrot sticks  Coffee or tea Snack  Fresh fruit or low-fat crackers Dinner  3 oz lean ground beef  1 baked potato  1 tsp margarine   cup asparagus  Lettuce salad  1 tbs non-creamy dressing   cup peach slices  1 cup skim milk Document Released:  08/28/2008 Document Revised: 05/20/2012 Document Reviewed: 01/19/2014 Exit[employer] Patient Information 2015 [employer]. This information is not intended to replace advice given to you by your health care provider. Make sure you discuss any questions you have with your health care provider.  

## 2014-09-21 ENCOUNTER — Telehealth: Payer: Self-pay | Admitting: Family

## 2014-09-21 ENCOUNTER — Other Ambulatory Visit: Payer: Self-pay

## 2014-09-21 MED ORDER — ISOSORBIDE MONONITRATE ER 60 MG PO TB24
60.0000 mg | ORAL_TABLET | Freq: Every day | ORAL | Status: DC
Start: 2014-09-21 — End: 2015-06-14

## 2014-09-21 MED ORDER — DICLOFENAC SODIUM 75 MG PO TBEC: 75.0000 mg | DELAYED_RELEASE_TABLET | Freq: Two times a day (BID) | ORAL | Status: DC

## 2014-09-21 NOTE — Telephone Encounter (Signed)
Imdur and diclofenac sent to pharmacy

## 2014-09-21 NOTE — Telephone Encounter (Signed)
Pt is using [id number] [place], [place] - [address] [phone] (Phone) [phone] (Fax) **Rx for diclofenac and lisinopril **

## 2014-09-27 ENCOUNTER — Other Ambulatory Visit: Payer: Self-pay | Admitting: Family

## 2014-10-04 ENCOUNTER — Encounter: Payer: Self-pay | Admitting: Family

## 2014-10-12 ENCOUNTER — Other Ambulatory Visit: Payer: Self-pay

## 2014-10-12 MED ORDER — DICLOFENAC SODIUM 75 MG PO TBEC: 75.0000 mg | DELAYED_RELEASE_TABLET | Freq: Two times a day (BID) | ORAL | Status: DC

## 2014-10-12 NOTE — Telephone Encounter (Signed)
Pt called. Instructions for Shelby Anderson are to take bid but Rx was written for #30. Pt states insurance company will not pay to refill it before 30 days.   New Rx sent to pharmacy for #60 with note to cancel Rx for #30

## 2014-10-20 ENCOUNTER — Encounter: Payer: Self-pay | Admitting: Pulmonary Disease

## 2014-10-20 ENCOUNTER — Ambulatory Visit (INDEPENDENT_AMBULATORY_CARE_PROVIDER_SITE_OTHER): Payer: Medicare Other | Admitting: Pulmonary Disease

## 2014-10-20 VITALS — BP 100/70 | HR 60 | Ht 66.0 in | Wt 233.4 lb

## 2014-10-20 DIAGNOSIS — J45901 Unspecified asthma with (acute) exacerbation: Secondary | ICD-10-CM

## 2014-10-20 DIAGNOSIS — J4530 Mild persistent asthma, uncomplicated: Secondary | ICD-10-CM

## 2014-10-20 NOTE — Patient Instructions (Signed)
Asthma seems to be controlled on dulera Trial of stopping singulair Resume if worse

## 2014-10-20 NOTE — Progress Notes (Signed)
   Subjective:    Patient ID: Shelby Anderson, female    DOB: May 11, 1956, 58 y.o.   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  She was evaluated for CAD  at [facility] for syncope.& has a loop recorder - brady & int a - fibn   Significant tests/ events  Spirometry 12/2011 - no airway obstruction -No reversibility with bronchodilator , mod restriction Her husband smokes.. She has positive PPD.  CXR LLL nodular infx - resolved 04/2012  Home sleep test 07/2012 - showed desatn, but could not afford O2    Jan 2015 -admitted  asthma exacerbation, community-acquired pneumonia involving the right upper lobe and RSV bronchitis.  EGD in 03/2011 (Buccini) has shown peptic ulcer disease.  12/2013  barium esophagram - distal esophageal stricture.      10/20/2014  Chief Complaint  Patient presents with  . Follow-up    waking up in the mornings feeling like she is going to suffocate, wheezing    stricture dilated (perry) She denies seasonal allergies, post nasal drip or breakthrough heartburn on nexium  Uses SABA 1 x /week 05/2014 pred , zpak Feel dulera has helped -exacerbations have reduced to 2-3/y singulair helps with congestion  Review of Systems neg for any significant sore throat, dysphagia, itching, sneezing, nasal congestion or excess/ purulent secretions, fever, chills, sweats, unintended wt loss, pleuritic or exertional cp, hempoptysis, orthopnea pnd or change in chronic leg swelling. Also denies presyncope, palpitations, heartburn, abdominal pain, nausea, vomiting, diarrhea or change in bowel or urinary habits, dysuria,hematuria, rash, arthralgias, visual complaints, headache, numbness weakness or ataxia.     Objective:   Physical Exam  Gen. Pleasant, obese, in no distress ENT - no lesions, no post  nasal drip Neck: No JVD, no thyromegaly, no carotid bruits Lungs: no use of accessory muscles, no dullness to percussion, decreased without rales or rhonchi  Cardiovascular: Rhythm regular, heart sounds  normal, no murmurs or gallops, no peripheral edema Musculoskeletal: No deformities, no cyanosis or clubbing , no tremors       Assessment & Plan:

## 2014-10-21 ENCOUNTER — Ambulatory Visit
Admission: RE | Admit: 2014-10-21 | Discharge: 2014-10-21 | Disposition: A | Discharge status: Home / Self Care | Payer: Medicare Other | Source: Ambulatory Visit | Attending: Family | Admitting: Family

## 2014-10-21 DIAGNOSIS — Z1231 Encounter for screening mammogram for malignant neoplasm of breast: Secondary | ICD-10-CM

## 2014-10-25 NOTE — Assessment & Plan Note (Signed)
Asthma seems to be controlled on dulera Trial of stopping singulair Resume if worse

## 2014-10-27 ENCOUNTER — Encounter: Payer: Self-pay | Admitting: Family

## 2014-10-27 ENCOUNTER — Ambulatory Visit (INDEPENDENT_AMBULATORY_CARE_PROVIDER_SITE_OTHER): Payer: Medicare Other | Admitting: Family

## 2014-10-27 VITALS — BP 130/80 | HR 75 | Ht 65.0 in | Wt 231.0 lb

## 2014-10-27 DIAGNOSIS — J309 Allergic rhinitis, unspecified: Secondary | ICD-10-CM

## 2014-10-27 DIAGNOSIS — E7800 Pure hypercholesterolemia, unspecified: Secondary | ICD-10-CM

## 2014-10-27 DIAGNOSIS — M19041 Primary osteoarthritis, right hand: Secondary | ICD-10-CM

## 2014-10-27 DIAGNOSIS — Z0000 Encounter for general adult medical examination without abnormal findings: Secondary | ICD-10-CM

## 2014-10-27 DIAGNOSIS — K219 Gastro-esophageal reflux disease without esophagitis: Secondary | ICD-10-CM

## 2014-10-27 LAB — CBC WITH DIFFERENTIAL/PLATELET
BASOS ABS: 0.1 10*3/uL (ref 0.0–0.1)
BASOS PCT: 0.8 % (ref 0.0–3.0)
EOS PCT: 3.6 % (ref 0.0–5.0)
Eosinophils Absolute: 0.2 10*3/uL (ref 0.0–0.7)
HEMATOCRIT: 42.7 % (ref 36.0–46.0)
HEMOGLOBIN: 13.8 g/dL (ref 12.0–15.0)
LYMPHS PCT: 30.7 % (ref 12.0–46.0)
Lymphs Abs: 2 10*3/uL (ref 0.7–4.0)
MCHC: 32.4 g/dL (ref 30.0–36.0)
MCV: 86.8 fl (ref 78.0–100.0)
MONOS PCT: 6.2 % (ref 3.0–12.0)
Monocytes Absolute: 0.4 10*3/uL (ref 0.1–1.0)
Neutro Abs: 3.8 10*3/uL (ref 1.4–7.7)
Neutrophils Relative %: 58.7 % (ref 43.0–77.0)
PLATELETS: 277 10*3/uL (ref 150.0–400.0)
RBC: 4.92 Mil/uL (ref 3.87–5.11)
RDW: 13.8 % (ref 11.5–15.5)
WBC: 6.4 10*3/uL (ref 4.0–10.5)

## 2014-10-27 LAB — POCT URINALYSIS DIPSTICK
Bilirubin, UA: NEGATIVE
Blood, UA: NEGATIVE
Glucose, UA: NEGATIVE
KETONES UA: NEGATIVE
NITRITE UA: NEGATIVE
PH UA: 6.5
Protein, UA: NEGATIVE
Spec Grav, UA: 1.015
UROBILINOGEN UA: 0.2

## 2014-10-27 LAB — HEPATIC FUNCTION PANEL
ALK PHOS: 77 U/L (ref 39–117)
ALT: 28 U/L (ref 0–35)
AST: 32 U/L (ref 0–37)
Albumin: 3.7 g/dL (ref 3.5–5.2)
Bilirubin, Direct: 0 mg/dL (ref 0.0–0.3)
TOTAL PROTEIN: 7.3 g/dL (ref 6.0–8.3)
Total Bilirubin: 0.5 mg/dL (ref 0.2–1.2)

## 2014-10-27 LAB — BASIC METABOLIC PANEL
BUN: 9 mg/dL (ref 6–23)
CO2: 29 meq/L (ref 19–32)
CREATININE: 0.7 mg/dL (ref 0.4–1.2)
Calcium: 8.9 mg/dL (ref 8.4–10.5)
Chloride: 102 mEq/L (ref 96–112)
GFR: 98.25 mL/min (ref >60.00)
Glucose, Bld: 91 mg/dL (ref 70–99)
Potassium: 3.9 mEq/L (ref 3.5–5.1)
SODIUM: 140 meq/L (ref 135–145)

## 2014-10-27 LAB — LIPID PANEL
CHOL/HDL RATIO: 4
CHOLESTEROL: 141 mg/dL (ref 0–200)
HDL: 36 mg/dL — AB (ref >39.00)
LDL Cholesterol: 84 mg/dL (ref 0–99)
NonHDL: 105
Triglycerides: 103 mg/dL (ref 0.0–149.0)
VLDL: 20.6 mg/dL (ref 0.0–40.0)

## 2014-10-27 LAB — TSH: TSH: 6.54 u[IU]/mL — AB (ref 0.35–4.50)

## 2014-10-27 MED ORDER — MELOXICAM 15 MG PO TABS: 15.0000 mg | ORAL_TABLET | Freq: Every day | ORAL | Status: DC

## 2014-10-27 NOTE — Patient Instructions (Signed)
Exercise to Lose Weight Exercise and a healthy diet may help you lose weight. Your doctor may suggest specific exercises. EXERCISE IDEAS AND TIPS  Choose low-cost things you enjoy doing, such as walking, bicycling, or exercising to workout videos.  Take stairs instead of the elevator.  Walk during your lunch break.  Park your car further away from work or school.  Go to a gym or an exercise class.  Start with 5 to 10 minutes of exercise each day. Build up to 30 minutes of exercise 4 to 6 days a week.  Wear shoes with good support and comfortable clothes.  Stretch before and after working out.  Work out until you breathe harder and your heart beats faster.  Drink extra water when you exercise.  Do not do so much that you hurt yourself, feel dizzy, or get very short of breath. Exercises that burn about 150 calories:  Running 1  miles in 15 minutes.  Playing volleyball for 45 to 60 minutes.  Washing and waxing a car for 45 to 60 minutes.  Playing touch football for 45 minutes.  Walking 1  miles in 35 minutes.  Pushing a stroller 1  miles in 30 minutes.  Playing basketball for 30 minutes.  Raking leaves for 30 minutes.  Bicycling 5 miles in 30 minutes.  Walking 2 miles in 30 minutes.  Dancing for 30 minutes.  Shoveling snow for 15 minutes.  Swimming laps for 20 minutes.  Walking up stairs for 15 minutes.  Bicycling 4 miles in 15 minutes.  Gardening for 30 to 45 minutes.  Jumping rope for 15 minutes.  Washing windows or floors for 45 to 60 minutes. Document Released: 12/22/2010 Document Revised: 02/11/2012 Document Reviewed: 12/22/2010 Exit[employer] Patient Information 2015 [employer]. This information is not intended to replace advice given to you by your health care provider. Make sure you discuss any questions you have with your health care provider.  

## 2014-10-27 NOTE — Progress Notes (Signed)
Pre visit review using our clinic review tool, if applicable. No additional management support is needed unless otherwise documented below in the visit note. 

## 2014-10-27 NOTE — Progress Notes (Signed)
Subjective:    Patient ID: Shelby Anderson, female    DOB: 04-17-1956, 58 y.o.   MRN: [id number]  HPI 58 year old white female, nonsmoker seen today for complete physical exam. She has a history of hypertension, hypokalemia, hyperlipidemia, GERD. She's currently tolerating medications well. Under the care of Dr. Toney Rakes for female well care and had a Pap smear this year. Mammogram was this week. Colonoscopy was in 2012 and had benign colon polyps. No family history of colon cancer.   Review of Systems  Constitutional: Negative.   HENT: Negative.   Eyes: Negative.   Respiratory: Negative.   Cardiovascular: Negative.   Gastrointestinal: Negative.   Endocrine: Negative.   Genitourinary: Negative.   Musculoskeletal: Negative.   Skin: Negative.   Allergic/Immunologic: Negative.   Neurological: Negative.   Hematological: Negative.   Psychiatric/Behavioral: Negative.    Past Medical History  Diagnosis Date  . Angina   . Heart murmur   . Mental disorder   . Coronary artery disease   . Hypertension   . Recurrent upper respiratory infection (URI)   . Tuberculosis     + TB SKIN TEST  . GERD (gastroesophageal reflux disease)   . Headache(784.0)   . Arthritis   . Anxiety   . Pneumonia   . Depression   . TMJ (dislocation of temporomandibular joint)   . Difficult intubation     "TMJ & woke up when they were still cutting on me"  . PONV (postoperative nausea and vomiting)   . Dysrhythmia   . Atrial fibrillation     h/o "AF w/frequent PVCs"  . Myocardial infarction 1980's & 1990;  . Shortness of breath 11/20/11    "all the time"  . Anemia     "chronic"  . H/O hiatal hernia   . Stomach ulcer     "3 small; found in 05/2011"  . History of migraines     "dx'd when I was in my teens"  . Fibromyalgia     "in my legs"  . Chronic back pain greater than 3 months duration   . Asthma     states Dr. Dagmar Hait told her she did not have asthma  . Asthmatic bronchitis     "seasonal"     History   Social History  . Marital Status: Married    Spouse Name: N/A    Number of Children: 3  . Years of Education: N/A   Occupational History  . Retired Therapist, sports    Social History Main Topics  . Smoking status: Never Smoker   . Smokeless tobacco: Never Used  . Alcohol Use: No  . Drug Use: No  . Sexual Activity: Yes    Birth Control/ Protection: Surgical   Other Topics Concern  . Not on file   Social History Narrative    Past Surgical History  Procedure Laterality Date  . Carpal tunnel release  unknown    left hand  . Achilles tendon repair  1970's    left ankle  . Arthroscopic repair acl      left knee cap  . Cardiac catheterization      loop recorder  . Post ganglionectomy  1970's    "for migraine headaches"  . Tubal ligation  1980's  . Pouch string  [phone]    "did this 3 times (once w/each pregnancy)"    Family History  Problem Relation Age of Onset  . Malignant hyperthermia Father   . Hypertension Father   . Heart  disease Father   . Diabetes Father   . Cancer Father     skin  . Anesthesia problems Neg Hx   . Hypotension Neg Hx   . Pseudochol deficiency Neg Hx   . Hypertension Mother   . Heart disease Mother   . Cancer Sister     CERVICAL  . Hypertension Sister   . Cancer Brother     MELANOMA  . Heart disease Maternal Grandmother   . Heart disease Maternal Grandfather   . Cancer Paternal Grandmother     ?   Marland Kitchen Heart disease Paternal Grandmother   . Heart disease Paternal Grandfather   . Cancer Brother     LUNG  . Diabetes Sister   . Hypertension Sister   . Heart disease Sister   . Cancer Sister   . Cancer Brother     Allergies  Allergen Reactions  . Oxycontin [Oxycodone Hcl] Anaphylaxis    hives, trouble breathing, tongue swelling (can't  tolerate oxycontin ) \  . Aspirin Other (See Comments)    GI Bleeds  . Darvocet [Propoxyphene N-Acetaminophen] Hives  . Lodine [Etodolac] Hives and Swelling  . Nitroglycerin     IV-BP drops   . Penicillins Other (See Comments)    unknown  . Ultram [Tramadol Hcl] Hives  . Valium Other (See Comments)    Circulation problems    Current Outpatient Prescriptions on File Prior to Visit  Medication Sig Dispense Refill  . albuterol (PROVENTIL HFA;VENTOLIN HFA) 108 (90 BASE) MCG/ACT inhaler Inhale 2 puffs into the lungs every 4 (four) hours as needed. Shortness of breath 1 Inhaler 3  . albuterol (PROVENTIL) (2.5 MG/3ML) 0.083% nebulizer solution Take 3 mLs (2.5 mg total) by nebulization every 6 (six) hours as needed for wheezing or shortness of breath. 75 mL 12  . aspirin 81 MG EC tablet Take 81 mg by mouth daily. Swallow whole.     Marland Kitchen atorvastatin (LIPITOR) 40 MG tablet Take 1 tablet (40 mg total) by mouth daily. 30 tablet 5  . Calcium Carbonate-Vitamin D (CALCIUM + D PO) Take 1 tablet by mouth 3 (three) times daily.      . Cinnamon 500 MG capsule Take 1,000 mg by mouth 2 (two) times daily.     Marland Kitchen esomeprazole (NEXIUM) 40 MG capsule Take 40 mg by mouth 2 (two) times daily before a meal.    . Eszopiclone 3 MG TABS 1 tab bedtime    . FLUDROCORTISONE ACETATE PO Take 0.1 mg by mouth 2 (two) times daily.    Marland Kitchen gabapentin (NEURONTIN) 400 MG capsule 1 capsule by mouth twice daily and 2 capsules at bedtime    . GLUCOSAMINE PO Take 1 tablet by mouth 2 (two) times daily.      Marland Kitchen ibuprofen (ADVIL,MOTRIN) 200 MG tablet Take 200 mg by mouth every 6 (six) hours as needed.    . isosorbide mononitrate (IMDUR) 60 MG 24 hr tablet Take 1 tablet (60 mg total) by mouth daily. 90 tablet 1  . KLOR-CON M10 10 MEQ tablet TAKE 1 TABLET EVERY DAY 90 tablet 1  . L-Methylfolate (DEPLIN) 7.5 MG TABS Take 7.5 mg by mouth daily.      Marland Kitchen lidocaine (LIDODERM) 5 % Place 2 patches onto the skin as needed. Remove & Discard patch within 12 hours or as directed by MD    . metoprolol succinate (TOPROL-XL) 50 MG 24 hr tablet Take 50 mg by mouth 2 (two) times daily. Take with or immediately following a meal.    .  mometasone-formoterol (DULERA) 200-5 MCG/ACT AERO Inhale 2 puffs into the lungs 2 (two) times daily. 1 Inhaler 4  . montelukast (SINGULAIR) 10 MG tablet Take 1 tablet (10 mg total) by mouth at bedtime. 30 tablet 5  . Multiple Vitamin (MULITIVITAMIN WITH MINERALS) TABS Take 1 tablet by mouth daily.      . NONFORMULARY OR COMPOUNDED ITEM Estradiol .02% 1 ML Prefilled Applicator Sig: apply vaginally twice a week #90 Day Supply with 4 refills 1 each 4  . Omega-3 Fatty Acids (FISH OIL) 1000 MG CAPS Take 1,000 mg by mouth 2 (two) times daily.    . Oxycodone HCl 20 MG TABS Take 20 mg by mouth every 4 (four) hours as needed (pain.).    Marland Kitchen progesterone (PROMETRIUM) 200 MG capsule Take one tablet the first 12 days of the month 36 capsule 4  . promethazine (PHENERGAN) 25 MG tablet TAKE 1 TABLET (25 MG TOTAL) BY MOUTH AT BEDTIME AS NEEDED FOR NAUSEA OR VOMITING. 30 tablet 1  . sucralfate (CARAFATE) 1 GM/10ML suspension Take 1 g by mouth 4 (four) times daily -  with meals and at bedtime.    . [DISCONTINUED] Potassium Chloride ER 20 MEQ TBCR Take 1 tablet by mouth daily. 30 tablet 3   No current facility-administered medications on file prior to visit.    BP 130/80 mmHg  Pulse 75  Ht 5\' 5"  (1.651 m)  Wt 231 lb (104.781 kg)  BMI 38.44 kg/m2chart    Objective:   Physical Exam  Constitutional: She is oriented to person, place, and time. She appears well-developed and well-nourished.  HENT:  Head: Normocephalic.  Right Ear: External ear normal.  Left Ear: External ear normal.  Nose: Nose normal.  Mouth/Throat: Oropharynx is clear and moist.  Eyes: Conjunctivae are normal. Pupils are equal, round, and reactive to light.  Neck: Normal range of motion. Neck supple.  Cardiovascular: Normal rate, regular rhythm and normal heart sounds.   Pulmonary/Chest: Effort normal and breath sounds normal.  Abdominal: Soft. Bowel sounds are normal.  Musculoskeletal: Normal range of motion.  Neurological: She is  alert and oriented to person, place, and time. She has normal reflexes.  Skin: Skin is warm and dry.  Psychiatric: She has a normal mood and affect.          Assessment & Plan:  Abryana was seen today for annual exam.  Diagnoses and associated orders for this visit:  Preventative health care - Lipid Panel - Basic Metabolic Panel - Hepatic Function Panel - CBC with Differential - POC Urinalysis Dipstick - TSH  Pure hypercholesterolemia - Lipid Panel - Basic Metabolic Panel - Hepatic Function Panel - CBC with Differential - POC Urinalysis Dipstick - TSH  Gastroesophageal reflux disease without esophagitis - Lipid Panel - Basic Metabolic Panel - Hepatic Function Panel - CBC with Differential - POC Urinalysis Dipstick - TSH  Allergic rhinitis, unspecified allergic rhinitis type - Lipid Panel - Basic Metabolic Panel - Hepatic Function Panel - CBC with Differential - POC Urinalysis Dipstick - TSH  Osteoarthritis of right hand, unspecified osteoarthritis type - Lipid Panel - Basic Metabolic Panel - Hepatic Function Panel - CBC with Differential - POC Urinalysis Dipstick - TSH  Other Orders - meloxicam (MOBIC) 15 MG tablet; Take 1 tablet (15 mg total) by mouth daily.   Encouraged a healthy diet and exercise. Trial meloxicam to help with osteoarthritis. Recheck pending labs and sooner as needed.

## 2014-11-02 DIAGNOSIS — Z95818 Presence of other cardiac implants and grafts: Secondary | ICD-10-CM | POA: Insufficient documentation

## 2014-11-02 DIAGNOSIS — I493 Ventricular premature depolarization: Secondary | ICD-10-CM | POA: Insufficient documentation

## 2014-11-02 DIAGNOSIS — R55 Syncope and collapse: Secondary | ICD-10-CM | POA: Insufficient documentation

## 2014-11-04 ENCOUNTER — Other Ambulatory Visit: Payer: Self-pay | Admitting: Internal Medicine

## 2014-11-05 ENCOUNTER — Other Ambulatory Visit: Payer: Self-pay | Admitting: Family

## 2014-11-08 ENCOUNTER — Other Ambulatory Visit: Payer: Self-pay | Admitting: Family

## 2014-11-12 ENCOUNTER — Telehealth: Payer: Self-pay | Admitting: Pulmonary Disease

## 2014-11-12 MED ORDER — PREDNISONE 10 MG PO TABS: ORAL_TABLET | ORAL | Status: DC

## 2014-11-12 MED ORDER — AZITHROMYCIN 250 MG PO TABS: ORAL_TABLET | ORAL | Status: DC

## 2014-11-12 NOTE — Addendum Note (Signed)
Addended by: Elie Confer on: 11/12/2014 05:11 PM   Modules accepted: Orders

## 2014-11-12 NOTE — Telephone Encounter (Signed)
Called and spoke with pt and she stated that she has been coughing x 4 days.  Started out with yellow sputum but now is green and reddish color.  Pt stated that she has not been checking her temp but last night she had night sweats.  Pt is requesting that something be called in for her.  MW please advise for RA. Thanks  Allergies  Allergen Reactions  . Oxycontin [Oxycodone Hcl] Anaphylaxis    hives, trouble breathing, tongue swelling (can't  tolerate oxycontin ) \  . Aspirin Other (See Comments)    GI Bleeds  . Darvocet [Propoxyphene N-Acetaminophen] Hives  . Lodine [Etodolac] Hives and Swelling  . Nitroglycerin     IV-BP drops  . Penicillins Other (See Comments)    unknown  . Ultram [Tramadol Hcl] Hives  . Valium Other (See Comments)    Circulation problems    Current Outpatient Prescriptions on File Prior to Visit  Medication Sig Dispense Refill  . albuterol (PROVENTIL HFA;VENTOLIN HFA) 108 (90 BASE) MCG/ACT inhaler Inhale 2 puffs into the lungs every 4 (four) hours as needed. Shortness of breath 1 Inhaler 3  . albuterol (PROVENTIL) (2.5 MG/3ML) 0.083% nebulizer solution Take 3 mLs (2.5 mg total) by nebulization every 6 (six) hours as needed for wheezing or shortness of breath. 75 mL 12  . aspirin 81 MG EC tablet Take 81 mg by mouth daily. Swallow whole.     Marland Kitchen atorvastatin (LIPITOR) 40 MG tablet Take 1 tablet (40 mg total) by mouth daily. 30 tablet 5  . Calcium Carbonate-Vitamin D (CALCIUM + D PO) Take 1 tablet by mouth 3 (three) times daily.      . Cinnamon 500 MG capsule Take 1,000 mg by mouth 2 (two) times daily.     Marland Kitchen esomeprazole (NEXIUM) 40 MG capsule Take 40 mg by mouth 2 (two) times daily before a meal.    . Eszopiclone 3 MG TABS 1 tab bedtime    . FLUDROCORTISONE ACETATE PO Take 0.1 mg by mouth 2 (two) times daily.    Marland Kitchen gabapentin (NEURONTIN) 400 MG capsule 1 capsule by mouth twice daily and 2 capsules at bedtime    . GLUCOSAMINE PO Take 1 tablet by mouth 2 (two) times  daily.      Marland Kitchen ibuprofen (ADVIL,MOTRIN) 200 MG tablet Take 200 mg by mouth every 6 (six) hours as needed.    . isosorbide mononitrate (IMDUR) 60 MG 24 hr tablet Take 1 tablet (60 mg total) by mouth daily. 90 tablet 1  . KLOR-CON M10 10 MEQ tablet TAKE 1 TABLET EVERY DAY 90 tablet 1  . L-Methylfolate (DEPLIN) 7.5 MG TABS Take 7.5 mg by mouth daily.      Marland Kitchen lidocaine (LIDODERM) 5 % Place 2 patches onto the skin as needed. Remove & Discard patch within 12 hours or as directed by MD    . meloxicam (MOBIC) 15 MG tablet Take 1 tablet (15 mg total) by mouth daily. 30 tablet 4  . metoprolol (LOPRESSOR) 50 MG tablet TAKE 1 TABLET BY MOUTH TWICE A DAY 60 tablet 6  . metoprolol succinate (TOPROL-XL) 50 MG 24 hr tablet Take 50 mg by mouth 2 (two) times daily. Take with or immediately following a meal.    . mometasone-formoterol (DULERA) 200-5 MCG/ACT AERO Inhale 2 puffs into the lungs 2 (two) times daily. 1 Inhaler 4  . montelukast (SINGULAIR) 10 MG tablet Take 1 tablet (10 mg total) by mouth at bedtime. 30 tablet 5  . Multiple  Vitamin (MULITIVITAMIN WITH MINERALS) TABS Take 1 tablet by mouth daily.      . NONFORMULARY OR COMPOUNDED ITEM Estradiol .02% 1 ML Prefilled Applicator Sig: apply vaginally twice a week #90 Day Supply with 4 refills 1 each 4  . Omega-3 Fatty Acids (FISH OIL) 1000 MG CAPS Take 1,000 mg by mouth 2 (two) times daily.    . Oxycodone HCl 20 MG TABS Take 20 mg by mouth every 4 (four) hours as needed (pain.).    Marland Kitchen progesterone (PROMETRIUM) 200 MG capsule Take one tablet the first 12 days of the month 36 capsule 4  . promethazine (PHENERGAN) 25 MG tablet TAKE 1 TABLET (25 MG TOTAL) BY MOUTH AT BEDTIME AS NEEDED FOR NAUSEA OR VOMITING. 30 tablet 1  . sucralfate (CARAFATE) 1 GM/10ML suspension Take 1 g by mouth 4 (four) times daily -  with meals and at bedtime.    . [DISCONTINUED] Potassium Chloride ER 20 MEQ TBCR Take 1 tablet by mouth daily. 30 tablet 3   No current facility-administered  medications on file prior to visit.

## 2014-11-12 NOTE — Telephone Encounter (Signed)
zpak Prednisone 10 mg take  4 each am x 2 days,   2 each am x 2 days,  1 each am x 2 days and stop  

## 2014-11-12 NOTE — Telephone Encounter (Signed)
Called and spoke with pt and she is aware of meds that have been called in to the pharmacy.  Nothing furhter is needed.

## 2014-11-25 ENCOUNTER — Other Ambulatory Visit: Payer: Self-pay | Admitting: Family

## 2014-12-28 DIAGNOSIS — G894 Chronic pain syndrome: Secondary | ICD-10-CM | POA: Diagnosis not present

## 2014-12-28 DIAGNOSIS — M4726 Other spondylosis with radiculopathy, lumbar region: Secondary | ICD-10-CM | POA: Diagnosis not present

## 2014-12-28 DIAGNOSIS — Z79891 Long term (current) use of opiate analgesic: Secondary | ICD-10-CM | POA: Diagnosis not present

## 2015-01-10 ENCOUNTER — Encounter: Payer: Self-pay | Admitting: Gynecology

## 2015-01-10 ENCOUNTER — Ambulatory Visit (INDEPENDENT_AMBULATORY_CARE_PROVIDER_SITE_OTHER): Payer: Medicare Other | Admitting: Gynecology

## 2015-01-10 VITALS — Ht 65.0 in | Wt 229.0 lb

## 2015-01-10 DIAGNOSIS — N950 Postmenopausal bleeding: Secondary | ICD-10-CM | POA: Diagnosis not present

## 2015-01-10 DIAGNOSIS — Z01419 Encounter for gynecological examination (general) (routine) without abnormal findings: Secondary | ICD-10-CM | POA: Diagnosis not present

## 2015-01-10 DIAGNOSIS — Z780 Asymptomatic menopausal state: Secondary | ICD-10-CM | POA: Diagnosis not present

## 2015-01-10 DIAGNOSIS — D251 Intramural leiomyoma of uterus: Secondary | ICD-10-CM

## 2015-01-10 MED ORDER — NONFORMULARY OR COMPOUNDED ITEM: Status: DC

## 2015-01-10 MED ORDER — PROGESTERONE MICRONIZED 200 MG PO CAPS: ORAL_CAPSULE | ORAL | Status: DC

## 2015-01-10 NOTE — Patient Instructions (Signed)

## 2015-01-10 NOTE — Progress Notes (Signed)
Shelby Anderson 08/16/1956 [id number]   History:    59 y.o.  for annual gyn exam who had been followed in the past and result of postmenopausal bleeding. She has been doing well on Prometrium 200 mg for 12 days of each month and had a very light spotting for a day and a half back in December. Her history is as follows:  Patient was seen in the office on 03/10/2013 as a result of referral from her primary provider in reference to an irregularity on the anterior cervical lip and patient stated she's had some spotting on and off at different x2 the year.  Patient has had 2 previous Cervical cerclages placed. Patient with anterior cervical hood as a result of 2 previous cerclages. Patient denies being a DES daughter. Patient stated recent Pap smear was normal as previous ones. At time of last visit there was stenosis of the cervical os and an endometrial biopsy was not able to be performed so she was placed on vaginal estrogen each bedtime for 2 weeks.  Patient was seen in the office last on 03/25/2013 whereby she was noted to have on pelvic exam an anterior cervical hood was noted but once again cervical os could not be found. Her ultrasound demonstrated the following:  Uterus measures 6.7 x 5.1 x 4.7 cm with endometrial stripe of 5.3 mm. Patient had 4 small intramural fibroids the largest one measuring 20 x 17 mm. Right and left ovary were normal a small calcified area in the left ovary measures 7 mm was noted. Several attempts were made with a lacrimal probe to identify a cervical os but was unsuccessful  It was believed that her bleeding may have been an atrophic bleed. She was started on Provera 10 mg one daily for 10 days of each month for 3 months and return to the office for an ultrasound for measurement And measurement of her endometrial stripe  Patient stated that during the 3 months and she was on the Provera she did not bleed. When she stopped she continued to bleed again.  As  follows:  September 15 and 16  October 13 and 14 November no.  December 14 and 15 January 29 and 30th these were spotting  February 1, 2, 3 was heavy bleeding   Ultrasound on 07/29/2013:  Uterus 6.3 x 5.6 x 4.7 cm with endometrial stripe of 4.2 mm. Endometrium was avascular. Right ovary normal left ovary a solid focus 4 mm avascular was noted otherwise negative cul-de-sac.  We were able to successfully dilate her cervix after a paracervical block in the office on February 5 of this year an endometrial biopsy was done which demonstrated the following:  Diagnosis  Endometrium, biopsy, uterus  - DISAGGREGATED FRAGMENTS OF ENDOMETRIAL GLANDS AND STROMA WITH FEATURES OF  BREAKDOWN.  - NEGATIVE FOR HYPERPLASIA OR MALIGNANCY.  - BENIGN ENDOCERVICAL MUCOSA.  - SQUAMOUS EPITHELIUM, NEGATIVE FOR INTRAEPITHELIAL LESION OR MALIGNANCY  Sonohysterogram on that day had demonstrated Sonohysterogram done in the office demonstrated the following:  Ultrasound today:  Uterus measures 6.1 x 4.8 x 4.7 cm with endometrial stripe of 6.4 mm. A left intramural fibroid measuring 19 x 18 mm and 17 x 10 mm was noted. Heterogeneous myometrium was reported. Both right and left ovary were normal with no fluid in the cul-de-sac. After instilling sterile saline into the uterine cavity there was no filling defect noted and anterior endometrial wall was 1.2 mm posterior wall 1.4 mm excluding fluid. Patient brought a  menstrual calendar whether results as follows:  March spotted for 2 days  April no bleeding  Many spotting 2, 3, 4  June 6 and 7 light spotting  In July 2 and third l light spotting  August no spotting or bleeding  Her primary care physician has been doing her blood work. She had her last colonoscopy in 2012 but does have a history of benign polyps have been removed in the past. Patient has done well with vaginal estrogen twice a week for vaginal atrophy and dyspareunia. She has not had a  colonoscopy yet. Her vaccines are up-to-date.  Past medical history,surgical history, family history and social history were all reviewed and documented in the EPIC chart.  Gynecologic History No LMP recorded. Patient is postmenopausal. Contraception: post menopausal status Last Pap: 2015. Results were: normal Last mammogram: 2015. Results were: normal  Obstetric History OB History  Gravida Para Term Preterm AB SAB TAB Ectopic Multiple Living  5 3   2 2    3     # Outcome Date GA Lbr Len/2nd Weight Sex Delivery Anes PTL Lv  5 SAB           4 SAB           3 Para           2 Para           1 Para                ROS: A ROS was performed and pertinent positives and negatives are included in the history.  GENERAL: No fevers or chills. HEENT: No change in vision, no earache, sore throat or sinus congestion. NECK: No pain or stiffness. CARDIOVASCULAR: No chest pain or pressure. No palpitations. PULMONARY: No shortness of breath, cough or wheeze. GASTROINTESTINAL: No abdominal pain, nausea, vomiting or diarrhea, melena or bright red blood per rectum. GENITOURINARY: No urinary frequency, urgency, hesitancy or dysuria. MUSCULOSKELETAL: No joint or muscle pain, no back pain, no recent trauma. DERMATOLOGIC: No rash, no itching, no lesions. ENDOCRINE: No polyuria, polydipsia, no heat or cold intolerance. No recent change in weight. HEMATOLOGICAL: No anemia or easy bruising or bleeding. NEUROLOGIC: No headache, seizures, numbness, tingling or weakness. PSYCHIATRIC: No depression, no loss of interest in normal activity or change in sleep pattern.     Exam: chaperone present  Ht 5\' 5"  (1.651 m)  Wt 229 lb (103.874 kg)  BMI 38.11 kg/m2  Body mass index is 38.11 kg/(m^2).  General appearance : Well developed well nourished female. No acute distress HEENT: Neck supple, trachea midline, no carotid bruits, no thyroidmegaly Lungs: Clear to auscultation, no rhonchi or wheezes, or rib retractions    Heart: Regular rate and rhythm, no murmurs or gallops Breast:Examined in sitting and supine position were symmetrical in appearance, no palpable masses or tenderness,  no skin retraction, no nipple inversion, no nipple discharge, no skin discoloration, no axillary or supraclavicular lymphadenopathy Abdomen: no palpable masses or tenderness, no rebound or guarding Extremities: no edema or skin discoloration or tenderness  Pelvic:  Bartholin, Urethra, Skene Glands: Within normal limits             Vagina: No gross lesions or discharge  Cervix: No gross lesions or discharge, anterior cervical hood from previous cerclages  Uterus  anteverted, normal size, shape and consistency, non-tender and mobile  Adnexa  Without masses or tenderness  Anus and perineum  normal   Rectovaginal  normal sphincter tone without palpated masses or tenderness  Hemoccult will provide     Assessment/Plan:  59 y.o. female for annual exam who will return back to the office at the end of February for an ultrasound for measurement of her endometrial stripe. She has done well on Prometrium 200 mg for 12 days of each month and vaginal estrogen twice a week. She was reminded that she will need a colonoscopy next year. She is going to schedule a bone density study here in her office in April. Her PCP will be drawn her blood work. We will provide her with stool cards to submit to the office for testing. We discussed importance of calcium vitamin D and regular exercise for osteoporosis prevention.   Terrance Mass MD, 3:24 PM 01/10/2015

## 2015-01-11 ENCOUNTER — Telehealth: Payer: Self-pay | Admitting: *Deleted

## 2015-01-11 MED ORDER — NONFORMULARY OR COMPOUNDED ITEM: Status: DC

## 2015-01-11 NOTE — Telephone Encounter (Signed)
Rx for estradiol 0.02% 1 ml was never sent to pharmacy because it was set on print. Rx called into [facility].

## 2015-01-13 ENCOUNTER — Other Ambulatory Visit: Payer: Self-pay | Admitting: Gynecology

## 2015-01-13 ENCOUNTER — Telehealth: Payer: Self-pay

## 2015-01-13 MED ORDER — CLOBETASOL PROPIONATE 0.05 % EX CREA: 1.0000 "application " | TOPICAL_CREAM | Freq: Two times a day (BID) | CUTANEOUS | Status: DC

## 2015-01-13 NOTE — Telephone Encounter (Signed)
Rx sent and patient is aware. 

## 2015-01-13 NOTE — Telephone Encounter (Signed)
Call in clobetasol 0.05% can apply twice a day for one-2 weeks when necessary. One tube with 2 refills

## 2015-01-13 NOTE — Telephone Encounter (Signed)
Pharmacy contacted [place] stating that patient was expecting new Rx for a steroid cream and they asked [place] to send it.  I did not see mention of this in office note so I contacted patient. She said that you had told her you would prescribe a steroid cream for her because she had some chafing.  She said you told her she would use it 2-3x daily for 7 days.  Rx?

## 2015-01-17 ENCOUNTER — Other Ambulatory Visit: Payer: Self-pay | Admitting: Family

## 2015-01-25 ENCOUNTER — Other Ambulatory Visit: Payer: Self-pay | Admitting: Internal Medicine

## 2015-01-26 ENCOUNTER — Ambulatory Visit (INDEPENDENT_AMBULATORY_CARE_PROVIDER_SITE_OTHER): Payer: Medicare Other | Admitting: Gynecology

## 2015-01-26 ENCOUNTER — Encounter: Payer: Self-pay | Admitting: Gynecology

## 2015-01-26 ENCOUNTER — Ambulatory Visit (INDEPENDENT_AMBULATORY_CARE_PROVIDER_SITE_OTHER): Payer: Medicare Other

## 2015-01-26 ENCOUNTER — Other Ambulatory Visit: Payer: Self-pay | Admitting: Gynecology

## 2015-01-26 DIAGNOSIS — R9389 Abnormal findings on diagnostic imaging of other specified body structures: Secondary | ICD-10-CM

## 2015-01-26 DIAGNOSIS — R938 Abnormal findings on diagnostic imaging of other specified body structures: Secondary | ICD-10-CM | POA: Diagnosis not present

## 2015-01-26 DIAGNOSIS — R934 Abnormal findings on diagnostic imaging of urinary organs: Secondary | ICD-10-CM

## 2015-01-26 DIAGNOSIS — D251 Intramural leiomyoma of uterus: Secondary | ICD-10-CM

## 2015-01-26 DIAGNOSIS — Z79890 Hormone replacement therapy (postmenopausal): Secondary | ICD-10-CM

## 2015-01-26 DIAGNOSIS — N950 Postmenopausal bleeding: Secondary | ICD-10-CM

## 2015-01-26 NOTE — Progress Notes (Signed)
Patient is a 59 year old who presented to the office today to discuss her ultrasound due to the fact that she had had postmenopausal bleeding and increased endometrial thickness. She is currently on Prometrium 200 mg 12 days of the month orally and vaginal laceration twice a week for vaginal atrophy. She reports no vaginal bleeding is otherwise asymptomatic. Her history summary as follows:  Patient was seen in the office on 03/10/2013 as a result of referral from her primary provider in reference to an irregularity on the anterior cervical lip and patient stated she's had some spotting on and off at different x2 the year.  Patient has had 2 previous Cervical cerclages placed. Patient with anterior cervical hood as a result of 2 previous cerclages. Patient denies being a DES daughter. Patient stated recent Pap smear was normal as previous ones. At time of last visit there was stenosis of the cervical os and an endometrial biopsy was not able to be performed so she was placed on vaginal estrogen each bedtime for 2 weeks.  Patient was seen in the office last on 03/25/2013 whereby she was noted to have on pelvic exam an anterior cervical hood was noted but once again cervical os could not be found. Her ultrasound demonstrated the following:  Uterus measures 6.7 x 5.1 x 4.7 cm with endometrial stripe of 5.3 mm. Patient had 4 small intramural fibroids the largest one measuring 20 x 17 mm. Right and left ovary were normal a small calcified area in the left ovary measures 7 mm was noted. Several attempts were made with a lacrimal probe to identify a cervical os but was unsuccessful  It was believed that her bleeding may have been an atrophic bleed. She was started on Provera 10 mg one daily for 10 days of each month for 3 months and return to the office for an ultrasound for measurement And measurement of her endometrial stripe  Patient stated that during the 3 months and she was on the Provera she did not  bleed. When she stopped she continued to bleed again.  As follows:  September 15 and 16  October 13 and 14 November no.  December 14 and 15 January 29 and 30th these were spotting  February 1, 2, 3 was heavy bleeding   Ultrasound on 07/29/2013:  Uterus 6.3 x 5.6 x 4.7 cm with endometrial stripe of 4.2 mm. Endometrium was avascular. Right ovary normal left ovary a solid focus 4 mm avascular was noted otherwise negative cul-de-sac.  We were able to successfully dilate her cervix after a paracervical block in the office on February 5 of this year an endometrial biopsy was done which demonstrated the following:  Diagnosis  Endometrium, biopsy, uterus  - DISAGGREGATED FRAGMENTS OF ENDOMETRIAL GLANDS AND STROMA WITH FEATURES OF  BREAKDOWN.  - NEGATIVE FOR HYPERPLASIA OR MALIGNANCY.  - BENIGN ENDOCERVICAL MUCOSA.  - SQUAMOUS EPITHELIUM, NEGATIVE FOR INTRAEPITHELIAL LESION OR MALIGNANCY  Sonohysterogram on that day had demonstrated Sonohysterogram done in the office demonstrated the following:  Ultrasound today:  Uterus measures 6.1 x 4.8 x 4.7 cm with endometrial stripe of 6.4 mm. A left intramural fibroid measuring 19 x 18 mm and 17 x 10 mm was noted. Heterogeneous myometrium was reported. Both right and left ovary were normal with no fluid in the cul-de-sac. After instilling sterile saline into the uterine cavity there was no filling defect noted and anterior endometrial wall was 1.2 mm posterior wall 1.4 mm excluding fluid. Patient brought a menstrual calendar whether  results as follows:  March spotted for 2 days  April no bleeding  Many spotting 2, 3, 4  June 6 and 7 light spotting  In July 2 and third l light spotting  August no spotting or bleeding  She reports no vaginal bleeding since August 2015.  Blood pressure today 120/78.  Ultrasound today: Uterus measures 6.2 x 4.4 x 4.6 cm with endometrial stripe of 5.2 mm. Patient had 2 small intramural myomas the  largest one measuring 21 x 23 mm. Right and left ovary otherwise normal. No apparent adnexal masses and no fluid in the cul-de-sac.  Assessment/plan: Menopausal patient on vaginal estrogen twice a week and Prometrium 200 mg by mouth for 12 days of the month doing well with no vaginal bleeding reported. Both ovaries normal. Endometrial stripe has decreased in size. Patient to report to the office any unusual bleeding for resampling. Greater than 50% of the time was spent on discussing the result of the ultrasound as well as management of her hormone replacement therapy.

## 2015-01-28 ENCOUNTER — Other Ambulatory Visit: Payer: Self-pay | Admitting: Pulmonary Disease

## 2015-02-28 DIAGNOSIS — Z79891 Long term (current) use of opiate analgesic: Secondary | ICD-10-CM | POA: Diagnosis not present

## 2015-02-28 DIAGNOSIS — M4726 Other spondylosis with radiculopathy, lumbar region: Secondary | ICD-10-CM | POA: Diagnosis not present

## 2015-02-28 DIAGNOSIS — G894 Chronic pain syndrome: Secondary | ICD-10-CM | POA: Diagnosis not present

## 2015-03-13 ENCOUNTER — Other Ambulatory Visit: Payer: Self-pay | Admitting: Family

## 2015-03-17 ENCOUNTER — Other Ambulatory Visit: Payer: Self-pay | Admitting: Gynecology

## 2015-03-17 ENCOUNTER — Ambulatory Visit (INDEPENDENT_AMBULATORY_CARE_PROVIDER_SITE_OTHER): Payer: Medicare Other

## 2015-03-17 DIAGNOSIS — Z780 Asymptomatic menopausal state: Secondary | ICD-10-CM

## 2015-03-17 DIAGNOSIS — Z13820 Encounter for screening for osteoporosis: Secondary | ICD-10-CM | POA: Diagnosis not present

## 2015-03-30 ENCOUNTER — Encounter: Payer: Self-pay | Admitting: Family

## 2015-03-30 ENCOUNTER — Ambulatory Visit (INDEPENDENT_AMBULATORY_CARE_PROVIDER_SITE_OTHER): Payer: Medicare Other | Admitting: Family

## 2015-03-30 VITALS — BP 134/84 | HR 73 | Temp 98.5°F | Wt 226.7 lb

## 2015-03-30 DIAGNOSIS — Z95818 Presence of other cardiac implants and grafts: Secondary | ICD-10-CM | POA: Diagnosis not present

## 2015-03-30 DIAGNOSIS — M79605 Pain in left leg: Secondary | ICD-10-CM

## 2015-03-30 DIAGNOSIS — M79604 Pain in right leg: Secondary | ICD-10-CM | POA: Diagnosis not present

## 2015-03-30 DIAGNOSIS — Z79899 Other long term (current) drug therapy: Secondary | ICD-10-CM

## 2015-03-30 DIAGNOSIS — E785 Hyperlipidemia, unspecified: Secondary | ICD-10-CM | POA: Diagnosis not present

## 2015-03-30 DIAGNOSIS — Z7982 Long term (current) use of aspirin: Secondary | ICD-10-CM | POA: Diagnosis not present

## 2015-03-30 DIAGNOSIS — R05 Cough: Secondary | ICD-10-CM | POA: Diagnosis not present

## 2015-03-30 DIAGNOSIS — R059 Cough, unspecified: Secondary | ICD-10-CM

## 2015-03-30 DIAGNOSIS — E669 Obesity, unspecified: Secondary | ICD-10-CM | POA: Diagnosis not present

## 2015-03-30 DIAGNOSIS — R55 Syncope and collapse: Secondary | ICD-10-CM | POA: Diagnosis not present

## 2015-03-30 DIAGNOSIS — I10 Essential (primary) hypertension: Secondary | ICD-10-CM | POA: Diagnosis not present

## 2015-03-30 DIAGNOSIS — J209 Acute bronchitis, unspecified: Secondary | ICD-10-CM

## 2015-03-30 MED ORDER — METHYLPREDNISOLONE 4 MG PO TBPK: ORAL_TABLET | ORAL | Status: DC

## 2015-03-30 MED ORDER — AZITHROMYCIN 250 MG PO TABS: ORAL_TABLET | ORAL | Status: DC

## 2015-03-30 NOTE — Progress Notes (Signed)
Subjective:    Patient ID: Shelby Anderson, female    DOB: 1956/06/11, 59 y.o.   MRN: [id number]  HPI  59 year old white female, nonsmoker with a history of asthma is in today with complaints of cough, congestion, sore throat and low-grade fever of 101.35 days. Has been taken over-the-counter Advil and a decongestant without much relief.   Additionally, patient reports pins and needle feeling in her legs bilaterally. Feels like her legs are on fire. Pain a 6 out of 10. Worse in the morning. Better after she is up and moving. Reports having a history of hypothyroidism in the past but was taken off of medication at some point. Has a family history significant for type 2 diabetes.  Review of Systems  Constitutional: Negative.   HENT: Positive for congestion.   Respiratory: Positive for cough and wheezing. Negative for shortness of breath.   Cardiovascular: Negative.   Gastrointestinal: Negative.   Endocrine: Negative.   Genitourinary: Negative.   Musculoskeletal: Negative.  Negative for joint swelling and arthralgias.  Skin: Negative.   Allergic/Immunologic: Negative.   Neurological:       Burning pain in her lower extremities bilaterally  Hematological: Negative.   Psychiatric/Behavioral: Negative.    Past Medical History  Diagnosis Date  . Angina   . Heart murmur   . Mental disorder   . Coronary artery disease   . Hypertension   . Recurrent upper respiratory infection (URI)   . Tuberculosis     + TB SKIN TEST  . GERD (gastroesophageal reflux disease)   . Headache(784.0)   . Arthritis   . Anxiety   . Pneumonia   . Depression   . TMJ (dislocation of temporomandibular joint)   . Difficult intubation     "TMJ & woke up when they were still cutting on me"  . PONV (postoperative nausea and vomiting)   . Dysrhythmia   . Atrial fibrillation     h/o "AF w/frequent PVCs"  . Myocardial infarction 1980's & 1990;  . Shortness of breath 11/20/11    "all the time"  .  Anemia     "chronic"  . H/O hiatal hernia   . Stomach ulcer     "3 small; found in 05/2011"  . History of migraines     "dx'd when I was in my teens"  . Fibromyalgia     "in my legs"  . Chronic back pain greater than 3 months duration   . Asthma     states Dr. Dagmar Hait told her she did not have asthma  . Asthmatic bronchitis     "seasonal"    History   Social History  . Marital Status: Married    Spouse Name: N/A  . Number of Children: 3  . Years of Education: N/A   Occupational History  . Retired Therapist, sports    Social History Main Topics  . Smoking status: Never Smoker   . Smokeless tobacco: Never Used  . Alcohol Use: No  . Drug Use: No  . Sexual Activity: Yes    Birth Control/ Protection: Surgical   Other Topics Concern  . Not on file   Social History Narrative    Past Surgical History  Procedure Laterality Date  . Carpal tunnel release  unknown    left hand  . Achilles tendon repair  1970's    left ankle  . Arthroscopic repair acl      left knee cap  . Cardiac catheterization  loop recorder  . Post ganglionectomy  1970's    "for migraine headaches"  . Tubal ligation  1980's  . Pouch string  [phone]    "did this 3 times (once w/each pregnancy)"    Family History  Problem Relation Age of Onset  . Malignant hyperthermia Father   . Hypertension Father   . Heart disease Father   . Diabetes Father   . Cancer Father     skin  . Anesthesia problems Neg Hx   . Hypotension Neg Hx   . Pseudochol deficiency Neg Hx   . Hypertension Mother   . Heart disease Mother   . Cancer Sister     CERVICAL  . Hypertension Sister   . Cancer Brother     MELANOMA  . Heart disease Maternal Grandmother   . Heart disease Maternal Grandfather   . Cancer Paternal Grandmother     ?   Marland Kitchen Heart disease Paternal Grandmother   . Heart disease Paternal Grandfather   . Cancer Brother     LUNG  . Diabetes Sister   . Hypertension Sister   . Heart disease Sister   . Cancer Sister    . Cancer Brother     Allergies  Allergen Reactions  . Oxycontin [Oxycodone Hcl] Anaphylaxis    hives, trouble breathing, tongue swelling (can't  tolerate oxycontin ) \  . Aspirin Other (See Comments)    GI Bleeds  . Darvocet [Propoxyphene N-Acetaminophen] Hives  . Lodine [Etodolac] Hives and Swelling  . Nitroglycerin     IV-BP drops  . Penicillins Other (See Comments)    unknown  . Ultram [Tramadol Hcl] Hives  . Valium Other (See Comments)    Circulation problems    Current Outpatient Prescriptions on File Prior to Visit  Medication Sig Dispense Refill  . albuterol (PROVENTIL HFA;VENTOLIN HFA) 108 (90 BASE) MCG/ACT inhaler Inhale 2 puffs into the lungs every 4 (four) hours as needed. Shortness of breath 1 Inhaler 3  . albuterol (PROVENTIL) (2.5 MG/3ML) 0.083% nebulizer solution Take 3 mLs (2.5 mg total) by nebulization every 6 (six) hours as needed for wheezing or shortness of breath. 75 mL 12  . aspirin 81 MG EC tablet Take 81 mg by mouth daily. Swallow whole.     Marland Kitchen atorvastatin (LIPITOR) 40 MG tablet TAKE 1 TABLET (40 MG TOTAL) BY MOUTH DAILY. 30 tablet 5  . Calcium Carbonate-Vitamin D (CALCIUM + D PO) Take 1 tablet by mouth 3 (three) times daily.      . Cinnamon 500 MG capsule Take 1,000 mg by mouth 2 (two) times daily.     . clobetasol cream (TEMOVATE) 1.75 % Apply 1 application topically 2 (two) times daily. For one-two weeks prn. 30 g 1  . esomeprazole (NEXIUM) 40 MG capsule Take 40 mg by mouth 2 (two) times daily before a meal.    . Eszopiclone 3 MG TABS 1 tab bedtime    . FLUDROCORTISONE ACETATE PO Take 0.1 mg by mouth 2 (two) times daily.    Marland Kitchen gabapentin (NEURONTIN) 400 MG capsule 1 capsule by mouth twice daily and 2 capsules at bedtime    . GLUCOSAMINE PO Take 1 tablet by mouth 2 (two) times daily.      Marland Kitchen ibuprofen (ADVIL,MOTRIN) 200 MG tablet Take 200 mg by mouth every 6 (six) hours as needed.    . isosorbide mononitrate (IMDUR) 60 MG 24 hr tablet Take 1 tablet (60 mg  total) by mouth daily. 90 tablet 1  .  KLOR-CON M10 10 MEQ tablet TAKE 1 TABLET EVERY DAY 30 tablet 3  . KLOR-CON M20 20 MEQ tablet TAKE 1 TABLET BY MOUTH DAILY. 30 tablet 3  . L-Methylfolate (DEPLIN) 7.5 MG TABS Take 7.5 mg by mouth daily.      Marland Kitchen lidocaine (LIDODERM) 5 % Place 2 patches onto the skin as needed. Remove & Discard patch within 12 hours or as directed by MD    . meloxicam (MOBIC) 15 MG tablet TAKE 1 TABLET (15 MG TOTAL) BY MOUTH DAILY. 30 tablet 4  . metoprolol (LOPRESSOR) 50 MG tablet TAKE 1 TABLET BY MOUTH TWICE A DAY 60 tablet 6  . metoprolol succinate (TOPROL-XL) 50 MG 24 hr tablet Take 50 mg by mouth 2 (two) times daily. Take with or immediately following a meal.    . mometasone-formoterol (DULERA) 200-5 MCG/ACT AERO Inhale 2 puffs into the lungs 2 (two) times daily. 1 Inhaler 4  . montelukast (SINGULAIR) 10 MG tablet Take 1 tablet (10 mg total) by mouth at bedtime. 30 tablet 5  . Multiple Vitamin (MULITIVITAMIN WITH MINERALS) TABS Take 1 tablet by mouth daily.      . NONFORMULARY OR COMPOUNDED ITEM Estradiol .02% 1 ML Prefilled Applicator Sig: apply vaginally twice a week #90 Day Supply with 4 refills 1 each 4  . Omega-3 Fatty Acids (FISH OIL) 1000 MG CAPS Take 1,000 mg by mouth 2 (two) times daily.    . Oxycodone HCl 20 MG TABS Take 20 mg by mouth every 4 (four) hours as needed (pain.).    Marland Kitchen predniSONE (DELTASONE) 10 MG tablet Take 4 tabs x 2 days, 2 tabs x 2 days, 1 tab x 2 days then stop. 14 tablet 0  . progesterone (PROMETRIUM) 200 MG capsule Take one tablet the first 12 days of the month 36 capsule 4  . sucralfate (CARAFATE) 1 GM/10ML suspension Take 1 g by mouth 4 (four) times daily -  with meals and at bedtime.    . promethazine (PHENERGAN) 25 MG tablet TAKE 1 TABLET (25 MG TOTAL) BY MOUTH AT BEDTIME AS NEEDED FOR NAUSEA OR VOMITING. (Patient not taking: Reported on 03/30/2015) 30 tablet 1  . [DISCONTINUED] Potassium Chloride ER 20 MEQ TBCR Take 1 tablet by mouth daily.  30 tablet 3   No current facility-administered medications on file prior to visit.    BP 134/84 mmHg  Pulse 73  Temp(Src) 98.5 F (36.9 C) (Oral)  Wt 226 lb 11.2 oz (102.83 kg)  SpO2 92%chart    Objective:   Physical Exam  Constitutional: She is oriented to person, place, and time. She appears well-developed and well-nourished.  Neck: Normal range of motion. Neck supple.  Cardiovascular: Normal rate, regular rhythm and normal heart sounds.   Pulmonary/Chest: Effort normal and breath sounds normal.  Abdominal: Soft. Bowel sounds are normal.  Musculoskeletal: Normal range of motion. She exhibits no edema or tenderness.  Neurological: She is alert and oriented to person, place, and time. She has normal reflexes. No cranial nerve deficit. Coordination normal.  Skin: Skin is warm and dry.  Psychiatric: She has a normal mood and affect.          Assessment & Plan:  Sakiya was seen today for cough.  Diagnoses and all orders for this visit:  Acute bronchitis, unspecified organism  Cough Orders: -     CBC with Differential  Bilateral leg pain Orders: -     CBC with Differential -     CMP -  TSH -     T3 -     T4  Obesity Orders: -     TSH -     T3 -     T4  Other orders -     methylPREDNISolone (MEDROL DOSEPAK) 4 MG TBPK tablet; As directed -     azithromycin (ZITHROMAX) 250 MG tablet; Take as directed   Labs obtained today will notify patient pending results. We'll treat with a Medrol dosepak as directed. Z-Pak as directed cover for infection given her chronic lung disease. Call the office with any questions or concerns. Recheck pending labs, as scheduled and sooner as needed.

## 2015-03-30 NOTE — Progress Notes (Signed)
Pre visit review using our clinic review tool, if applicable. No additional management support is needed unless otherwise documented below in the visit note. 

## 2015-03-30 NOTE — Patient Instructions (Signed)

## 2015-03-31 ENCOUNTER — Other Ambulatory Visit: Payer: Self-pay | Admitting: Family

## 2015-03-31 DIAGNOSIS — M79605 Pain in left leg: Principal | ICD-10-CM

## 2015-03-31 DIAGNOSIS — M79604 Pain in right leg: Secondary | ICD-10-CM

## 2015-03-31 LAB — TSH: TSH: 3.63 u[IU]/mL (ref 0.35–4.50)

## 2015-03-31 LAB — COMPREHENSIVE METABOLIC PANEL
ALT: 21 U/L (ref 0–35)
AST: 29 U/L (ref 0–37)
Albumin: 3.8 g/dL (ref 3.5–5.2)
Alkaline Phosphatase: 79 U/L (ref 39–117)
BUN: 13 mg/dL (ref 6–23)
CO2: 32 meq/L (ref 19–32)
Calcium: 9.5 mg/dL (ref 8.4–10.5)
Chloride: 103 mEq/L (ref 96–112)
Creatinine, Ser: 0.67 mg/dL (ref 0.40–1.20)
GFR: 96.41 mL/min (ref >60.00)
Glucose, Bld: 112 mg/dL — ABNORMAL HIGH (ref 70–99)
Potassium: 3.8 mEq/L (ref 3.5–5.1)
Sodium: 141 mEq/L (ref 135–145)
TOTAL PROTEIN: 7.3 g/dL (ref 6.0–8.3)
Total Bilirubin: 0.4 mg/dL (ref 0.2–1.2)

## 2015-03-31 LAB — CBC WITH DIFFERENTIAL/PLATELET
BASOS ABS: 0 10*3/uL (ref 0.0–0.1)
Basophils Relative: 0.6 % (ref 0.0–3.0)
Eosinophils Absolute: 0.3 10*3/uL (ref 0.0–0.7)
Eosinophils Relative: 4.2 % (ref 0.0–5.0)
HCT: 40.3 % (ref 36.0–46.0)
HEMOGLOBIN: 13.4 g/dL (ref 12.0–15.0)
LYMPHS PCT: 26 % (ref 12.0–46.0)
Lymphs Abs: 1.7 10*3/uL (ref 0.7–4.0)
MCHC: 33.2 g/dL (ref 30.0–36.0)
MCV: 84.9 fl (ref 78.0–100.0)
MONOS PCT: 4.7 % (ref 3.0–12.0)
Monocytes Absolute: 0.3 10*3/uL (ref 0.1–1.0)
NEUTROS ABS: 4.2 10*3/uL (ref 1.4–7.7)
NEUTROS PCT: 64.5 % (ref 43.0–77.0)
Platelets: 249 10*3/uL (ref 150.0–400.0)
RBC: 4.74 Mil/uL (ref 3.87–5.11)
RDW: 14.3 % (ref 11.5–15.5)
WBC: 6.4 10*3/uL (ref 4.0–10.5)

## 2015-03-31 LAB — T3, FREE: T3, Free: 3.1 pg/mL (ref 2.3–4.2)

## 2015-03-31 LAB — T4, FREE: Free T4: 0.68 ng/dL (ref 0.60–1.60)

## 2015-04-05 ENCOUNTER — Other Ambulatory Visit: Payer: Self-pay | Admitting: Family

## 2015-04-05 DIAGNOSIS — M79605 Pain in left leg: Principal | ICD-10-CM

## 2015-04-05 DIAGNOSIS — M79604 Pain in right leg: Secondary | ICD-10-CM

## 2015-04-11 ENCOUNTER — Ambulatory Visit (HOSPITAL_COMMUNITY): Payer: Medicare Other | Attending: Family

## 2015-04-11 DIAGNOSIS — I2510 Atherosclerotic heart disease of native coronary artery without angina pectoris: Secondary | ICD-10-CM | POA: Diagnosis not present

## 2015-04-11 DIAGNOSIS — M79604 Pain in right leg: Secondary | ICD-10-CM | POA: Diagnosis not present

## 2015-04-11 DIAGNOSIS — R200 Anesthesia of skin: Secondary | ICD-10-CM | POA: Insufficient documentation

## 2015-04-11 DIAGNOSIS — M79605 Pain in left leg: Secondary | ICD-10-CM | POA: Diagnosis not present

## 2015-04-11 DIAGNOSIS — I10 Essential (primary) hypertension: Secondary | ICD-10-CM | POA: Insufficient documentation

## 2015-04-12 ENCOUNTER — Ambulatory Visit (INDEPENDENT_AMBULATORY_CARE_PROVIDER_SITE_OTHER): Payer: Medicare Other | Admitting: Family

## 2015-04-12 ENCOUNTER — Encounter (HOSPITAL_COMMUNITY): Payer: Medicare Other

## 2015-04-12 ENCOUNTER — Encounter: Payer: Self-pay | Admitting: Family

## 2015-04-12 VITALS — BP 124/82 | HR 73 | Temp 98.1°F | Wt 230.0 lb

## 2015-04-12 DIAGNOSIS — M5416 Radiculopathy, lumbar region: Secondary | ICD-10-CM

## 2015-04-12 DIAGNOSIS — M545 Low back pain: Secondary | ICD-10-CM

## 2015-04-12 DIAGNOSIS — E785 Hyperlipidemia, unspecified: Secondary | ICD-10-CM

## 2015-04-12 DIAGNOSIS — G8929 Other chronic pain: Secondary | ICD-10-CM

## 2015-04-12 DIAGNOSIS — I10 Essential (primary) hypertension: Secondary | ICD-10-CM

## 2015-04-12 MED ORDER — ALCLOMETASONE DIPROPIONATE 0.05 % EX CREA: TOPICAL_CREAM | Freq: Two times a day (BID) | CUTANEOUS | Status: DC

## 2015-04-12 NOTE — Progress Notes (Signed)
Subjective:    Patient ID: ANHAR MCDERMOTT, female    DOB: 08/20/1956, 59 y.o.   MRN: [id number]  HPI  59 year old white female, with a history of asthma, coronary artery disease, GERD, hypertension is in today for recheck. Labs were stable at last office visit. She was treated for lumbar radiculopathy. Has a history of chronic low back pain with a bulging disc. Also had lower extremity Doppler studies for which we are awaiting results. Prednisone is helped her symptoms. Pain has improved her lower extremities. Pain 1-2 out of 10.   Review of Systems  Constitutional: Negative.   HENT: Negative.   Respiratory: Negative.   Cardiovascular: Negative.   Gastrointestinal: Negative.   Endocrine: Negative.   Genitourinary: Negative.   Musculoskeletal: Negative.   Skin: Negative.   Allergic/Immunologic: Negative.   Neurological: Negative.   Hematological: Negative.   Psychiatric/Behavioral: Positive for agitation.   Past Medical History  Diagnosis Date  . Angina   . Heart murmur   . Mental disorder   . Coronary artery disease   . Hypertension   . Recurrent upper respiratory infection (URI)   . Tuberculosis     + TB SKIN TEST  . GERD (gastroesophageal reflux disease)   . Headache(784.0)   . Arthritis   . Anxiety   . Pneumonia   . Depression   . TMJ (dislocation of temporomandibular joint)   . Difficult intubation     "TMJ & woke up when they were still cutting on me"  . PONV (postoperative nausea and vomiting)   . Dysrhythmia   . Atrial fibrillation     h/o "AF w/frequent PVCs"  . Myocardial infarction 1980's & 1990;  . Shortness of breath 11/20/11    "all the time"  . Anemia     "chronic"  . H/O hiatal hernia   . Stomach ulcer     "3 small; found in 05/2011"  . History of migraines     "dx'd when I was in my teens"  . Fibromyalgia     "in my legs"  . Chronic back pain greater than 3 months duration   . Asthma     states Dr. Dagmar Hait told her she did not have  asthma  . Asthmatic bronchitis     "seasonal"    History   Social History  . Marital Status: Married    Spouse Name: N/A  . Number of Children: 3  . Years of Education: N/A   Occupational History  . Retired Therapist, sports    Social History Main Topics  . Smoking status: Never Smoker   . Smokeless tobacco: Never Used  . Alcohol Use: No  . Drug Use: No  . Sexual Activity: Yes    Birth Control/ Protection: Surgical   Other Topics Concern  . Not on file   Social History Narrative    Past Surgical History  Procedure Laterality Date  . Carpal tunnel release  unknown    left hand  . Achilles tendon repair  1970's    left ankle  . Arthroscopic repair acl      left knee cap  . Cardiac catheterization      loop recorder  . Post ganglionectomy  1970's    "for migraine headaches"  . Tubal ligation  1980's  . Pouch string  [phone]    "did this 3 times (once w/each pregnancy)"    Family History  Problem Relation Age of Onset  . Malignant hyperthermia Father   .  Hypertension Father   . Heart disease Father   . Diabetes Father   . Cancer Father     skin  . Anesthesia problems Neg Hx   . Hypotension Neg Hx   . Pseudochol deficiency Neg Hx   . Hypertension Mother   . Heart disease Mother   . Cancer Sister     CERVICAL  . Hypertension Sister   . Cancer Brother     MELANOMA  . Heart disease Maternal Grandmother   . Heart disease Maternal Grandfather   . Cancer Paternal Grandmother     ?   Marland Kitchen Heart disease Paternal Grandmother   . Heart disease Paternal Grandfather   . Cancer Brother     LUNG  . Diabetes Sister   . Hypertension Sister   . Heart disease Sister   . Cancer Sister   . Cancer Brother     Allergies  Allergen Reactions  . Oxycontin [Oxycodone Hcl] Anaphylaxis    hives, trouble breathing, tongue swelling (can't  tolerate oxycontin ) \  . Aspirin Other (See Comments)    GI Bleeds  . Darvocet [Propoxyphene N-Acetaminophen] Hives  . Lodine [Etodolac] Hives  and Swelling  . Nitroglycerin     IV-BP drops  . Penicillins Other (See Comments)    unknown  . Ultram [Tramadol Hcl] Hives  . Valium Other (See Comments)    Circulation problems    Current Outpatient Prescriptions on File Prior to Visit  Medication Sig Dispense Refill  . albuterol (PROVENTIL HFA;VENTOLIN HFA) 108 (90 BASE) MCG/ACT inhaler Inhale 2 puffs into the lungs every 4 (four) hours as needed. Shortness of breath 1 Inhaler 3  . albuterol (PROVENTIL) (2.5 MG/3ML) 0.083% nebulizer solution Take 3 mLs (2.5 mg total) by nebulization every 6 (six) hours as needed for wheezing or shortness of breath. 75 mL 12  . aspirin 81 MG EC tablet Take 81 mg by mouth daily. Swallow whole.     Marland Kitchen atorvastatin (LIPITOR) 40 MG tablet TAKE 1 TABLET (40 MG TOTAL) BY MOUTH DAILY. 30 tablet 5  . Calcium Carbonate-Vitamin D (CALCIUM + D PO) Take 1 tablet by mouth 3 (three) times daily.      . Cinnamon 500 MG capsule Take 1,000 mg by mouth 2 (two) times daily.     . clobetasol cream (TEMOVATE) 7.82 % Apply 1 application topically 2 (two) times daily. For one-two weeks prn. 30 g 1  . esomeprazole (NEXIUM) 40 MG capsule Take 40 mg by mouth 2 (two) times daily before a meal.    . Eszopiclone 3 MG TABS 1 tab bedtime    . FLUDROCORTISONE ACETATE PO Take 0.1 mg by mouth 2 (two) times daily.    Marland Kitchen gabapentin (NEURONTIN) 400 MG capsule 1 capsule by mouth twice daily and 2 capsules at bedtime    . GLUCOSAMINE PO Take 1 tablet by mouth 2 (two) times daily.      Marland Kitchen ibuprofen (ADVIL,MOTRIN) 200 MG tablet Take 200 mg by mouth every 6 (six) hours as needed.    . isosorbide mononitrate (IMDUR) 60 MG 24 hr tablet Take 1 tablet (60 mg total) by mouth daily. 90 tablet 1  . KLOR-CON M10 10 MEQ tablet TAKE 1 TABLET EVERY DAY 30 tablet 3  . KLOR-CON M20 20 MEQ tablet TAKE 1 TABLET BY MOUTH DAILY. 30 tablet 3  . L-Methylfolate (DEPLIN) 7.5 MG TABS Take 7.5 mg by mouth daily.      Marland Kitchen lidocaine (LIDODERM) 5 % Place 2 patches onto  the  skin as needed. Remove & Discard patch within 12 hours or as directed by MD    . meloxicam (MOBIC) 15 MG tablet TAKE 1 TABLET (15 MG TOTAL) BY MOUTH DAILY. 30 tablet 4  . methylPREDNISolone (MEDROL DOSEPAK) 4 MG TBPK tablet As directed 21 tablet 0  . metoprolol (LOPRESSOR) 50 MG tablet TAKE 1 TABLET BY MOUTH TWICE A DAY 60 tablet 6  . metoprolol succinate (TOPROL-XL) 50 MG 24 hr tablet Take 50 mg by mouth 2 (two) times daily. Take with or immediately following a meal.    . mometasone-formoterol (DULERA) 200-5 MCG/ACT AERO Inhale 2 puffs into the lungs 2 (two) times daily. 1 Inhaler 4  . montelukast (SINGULAIR) 10 MG tablet Take 1 tablet (10 mg total) by mouth at bedtime. 30 tablet 5  . Multiple Vitamin (MULITIVITAMIN WITH MINERALS) TABS Take 1 tablet by mouth daily.      . NONFORMULARY OR COMPOUNDED ITEM Estradiol .02% 1 ML Prefilled Applicator Sig: apply vaginally twice a week #90 Day Supply with 4 refills 1 each 4  . Omega-3 Fatty Acids (FISH OIL) 1000 MG CAPS Take 1,000 mg by mouth 2 (two) times daily.    . Oxycodone HCl 20 MG TABS Take 20 mg by mouth every 4 (four) hours as needed (pain.).    Marland Kitchen progesterone (PROMETRIUM) 200 MG capsule Take one tablet the first 12 days of the month 36 capsule 4  . promethazine (PHENERGAN) 25 MG tablet TAKE 1 TABLET (25 MG TOTAL) BY MOUTH AT BEDTIME AS NEEDED FOR NAUSEA OR VOMITING. 30 tablet 1  . sucralfate (CARAFATE) 1 GM/10ML suspension Take 1 g by mouth 4 (four) times daily -  with meals and at bedtime.    . [DISCONTINUED] Potassium Chloride ER 20 MEQ TBCR Take 1 tablet by mouth daily. 30 tablet 3   No current facility-administered medications on file prior to visit.    BP 124/82 mmHg  Pulse 73  Temp(Src) 98.1 F (36.7 C) (Oral)  Wt 230 lb (104.327 kg)chart    Objective:   Physical Exam  Constitutional: She is oriented to person, place, and time. She appears well-developed and well-nourished.  HENT:  Right Ear: External ear normal.  Left Ear:  External ear normal.  Nose: Nose normal.  Mouth/Throat: Oropharynx is clear and moist.  Neck: Normal range of motion. Neck supple. No thyromegaly present.  Cardiovascular: Normal rate, regular rhythm and normal heart sounds.   Pulmonary/Chest: Effort normal and breath sounds normal.  Abdominal: Soft. Bowel sounds are normal.  Musculoskeletal: Normal range of motion.  Neurological: She is alert and oriented to person, place, and time.  Skin: Skin is warm and dry.  Psychiatric: She has a normal mood and affect.          Assessment & Plan:  Francheska was seen today for follow-up.  Diagnoses and all orders for this visit:  Essential hypertension  Hyperlipemia  Chronic low back pain  Other orders -     alclomethasone (ACLOVATE) 0.05 % cream; Apply topically 2 (two) times daily.   Await results of lower extremity Doppler. Since her symptoms are better today we will hold off on an MRI of the L-spine. However, if symptoms return, we should consider an MRI of the lumbar spine T evaluate nerve involvement causing lower extremity pain.

## 2015-04-12 NOTE — Patient Instructions (Signed)

## 2015-04-13 ENCOUNTER — Other Ambulatory Visit: Payer: Self-pay

## 2015-04-13 DIAGNOSIS — M5416 Radiculopathy, lumbar region: Secondary | ICD-10-CM

## 2015-04-21 ENCOUNTER — Ambulatory Visit: Payer: Medicare Other | Admitting: Adult Health

## 2015-04-25 ENCOUNTER — Ambulatory Visit: Payer: Medicare Other | Admitting: Family

## 2015-04-25 ENCOUNTER — Encounter: Payer: Self-pay | Admitting: Adult Health

## 2015-04-25 ENCOUNTER — Ambulatory Visit (INDEPENDENT_AMBULATORY_CARE_PROVIDER_SITE_OTHER): Payer: Medicare Other | Admitting: Adult Health

## 2015-04-25 VITALS — BP 126/80 | HR 56 | Temp 98.1°F | Ht 66.0 in | Wt 233.0 lb

## 2015-04-25 DIAGNOSIS — J45901 Unspecified asthma with (acute) exacerbation: Secondary | ICD-10-CM | POA: Diagnosis not present

## 2015-04-25 MED ORDER — MONTELUKAST SODIUM 10 MG PO TABS: 10.0000 mg | ORAL_TABLET | Freq: Every day | ORAL | Status: DC

## 2015-04-25 MED ORDER — PREDNISONE 10 MG PO TABS: ORAL_TABLET | ORAL | Status: DC

## 2015-04-25 NOTE — Patient Instructions (Signed)
Restart Singulair 10mg  daily .  Try Zyrtec 10mg  At bedtime  As needed  Drainage .  Prednisone taper over next week.  Follow up Dr. Elsworth Soho  In 6 months and As needed

## 2015-04-25 NOTE — Progress Notes (Signed)
   Subjective:    Patient ID: Shelby Anderson, female    DOB: [birth year], 59 y.o.   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  She was evaluated for CAD  at [facility] for syncope.& has a loop recorder - brady & int a - fibn   Significant tests/ events  Spirometry 12/2011 - no airway obstruction -No reversibility with bronchodilator , mod restriction Her husband smokes.. She has positive PPD.  CXR LLL nodular infx - resolved 04/2012  Home sleep test 07/2012 - showed desatn, but could not afford O2    Jan 2015 -admitted  asthma exacerbation, community-acquired pneumonia involving the right upper lobe and RSV bronchitis.  EGD in 03/2011 ([facility]) has shown peptic ulcer disease.  12/2013  barium esophagram - distal esophageal stricture.     stricture dilated (perry) She denies seasonal allergies, post nasal drip or breakthrough heartburn on nexium  Uses SABA 1 x /week 05/2014 pred , zpak Feel dulera has helped -exacerbations have reduced to 2-3/y singulair helps with congestion  04/25/2015 Follow up : Asthma  Returns for 6 month follow up  Complains of  tightness, wheezing x2-3 days, dry cough onset this morning.  Denies any f/c/s, n/v/d, purulent sputum, hemoptysis or chest pain.  Would like to discuss going back on Singulair Feels she has had more flares off Singulair .  Had flare of asthma in earlier spring requiring prednisone .  Leaving for the vacation soon.  Taking Dulera Twice daily  , no missed doses.  Does complain of post nasal drainage.    Review of Systems neg for any significant sore throat, dysphagia, itching, sneezing, nasal congestion or excess/ purulent secretions, fever, chills, sweats, unintended wt loss, pleuritic or exertional cp, hempoptysis, orthopnea pnd or change in chronic leg  swelling. Also denies presyncope, palpitations, heartburn, abdominal pain, nausea, vomiting, diarrhea or change in bowel or urinary habits, dysuria,hematuria, rash, arthralgias, visual complaints, headache, numbness weakness or ataxia.     Objective:   Physical Exam  Gen. Pleasant, obese, in no distress ENT - no lesions, no post nasal drip Neck: No JVD, no thyromegaly, no carotid bruits Lungs: no use of accessory muscles, no dullness to percussion, faint exp wheezes  Cardiovascular: Rhythm regular, heart sounds  normal, no murmurs or gallops, no peripheral edema Musculoskeletal: No deformities, no cyanosis or clubbing , no tremors       Assessment & Plan:

## 2015-04-25 NOTE — Assessment & Plan Note (Signed)
Flare with AR   Plan  Restart Singulair 10mg  daily .  Try Zyrtec 10mg  At bedtime  As needed  Drainage .  Prednisone taper over next week.  Follow up Dr. Elsworth Soho  In 6 months and As needed

## 2015-04-25 NOTE — Progress Notes (Signed)
Reviewed & agree with plan  

## 2015-04-26 ENCOUNTER — Other Ambulatory Visit: Payer: Self-pay | Admitting: Family

## 2015-04-26 ENCOUNTER — Ambulatory Visit
Admission: RE | Admit: 2015-04-26 | Discharge: 2015-04-26 | Disposition: A | Discharge status: Home / Self Care | Payer: Medicare Other | Source: Ambulatory Visit | Attending: Family | Admitting: Family

## 2015-04-26 DIAGNOSIS — M47817 Spondylosis without myelopathy or radiculopathy, lumbosacral region: Secondary | ICD-10-CM | POA: Diagnosis not present

## 2015-04-26 DIAGNOSIS — M5126 Other intervertebral disc displacement, lumbar region: Secondary | ICD-10-CM | POA: Diagnosis not present

## 2015-04-26 DIAGNOSIS — M5416 Radiculopathy, lumbar region: Secondary | ICD-10-CM

## 2015-04-28 DIAGNOSIS — Z79891 Long term (current) use of opiate analgesic: Secondary | ICD-10-CM | POA: Diagnosis not present

## 2015-04-29 DIAGNOSIS — Z79891 Long term (current) use of opiate analgesic: Secondary | ICD-10-CM | POA: Diagnosis not present

## 2015-04-29 DIAGNOSIS — M4726 Other spondylosis with radiculopathy, lumbar region: Secondary | ICD-10-CM | POA: Diagnosis not present

## 2015-04-29 DIAGNOSIS — G894 Chronic pain syndrome: Secondary | ICD-10-CM | POA: Diagnosis not present

## 2015-05-03 ENCOUNTER — Other Ambulatory Visit: Payer: Self-pay | Admitting: Family

## 2015-05-03 DIAGNOSIS — M5126 Other intervertebral disc displacement, lumbar region: Secondary | ICD-10-CM

## 2015-05-03 DIAGNOSIS — M5136 Other intervertebral disc degeneration, lumbar region: Secondary | ICD-10-CM

## 2015-05-23 ENCOUNTER — Other Ambulatory Visit: Payer: Self-pay | Admitting: Internal Medicine

## 2015-05-23 ENCOUNTER — Other Ambulatory Visit: Payer: Self-pay | Admitting: Family

## 2015-06-07 ENCOUNTER — Telehealth: Payer: Self-pay | Admitting: Pulmonary Disease

## 2015-06-07 MED ORDER — AZITHROMYCIN 250 MG PO TABS: 250.0000 mg | ORAL_TABLET | ORAL | Status: DC

## 2015-06-07 MED ORDER — PREDNISONE 10 MG PO TABS: ORAL_TABLET | ORAL | Status: DC

## 2015-06-07 NOTE — Telephone Encounter (Signed)
Spoke with pt - severe coughing over the phone, unable to carry on a conversation. Pt states that she has been coughing like this since Friday 7/1 and cannot get it under control.  Pt c/o wheezing and yellow/green mucus production which started yesterday. Has tried Mucinex OTC and [facility] cough syrup - no relief from either. Pt unable to come in today d/t not having transporation. Requests something be called in ASAP - afraid she will end up in hospital.  Please advise Dr Elsworth Soho. Thanks.

## 2015-06-07 NOTE — Telephone Encounter (Signed)
zpak Prednisone 10 mg tabs  Take 2 tabs daily with food x 5ds, then 1 tab daily with food x 5ds then STOP OV if no better by friday

## 2015-06-07 NOTE — Telephone Encounter (Signed)
Pt aware of rec's per RA Zpak and Pred called into [facility].  Nothing further needed.

## 2015-06-09 ENCOUNTER — Other Ambulatory Visit: Payer: Self-pay | Admitting: Family

## 2015-06-09 NOTE — Telephone Encounter (Signed)
Rx sent in

## 2015-06-12 ENCOUNTER — Other Ambulatory Visit: Payer: Self-pay | Admitting: Family

## 2015-06-14 ENCOUNTER — Other Ambulatory Visit: Payer: Self-pay | Admitting: Family

## 2015-06-14 ENCOUNTER — Other Ambulatory Visit: Payer: Self-pay | Admitting: *Deleted

## 2015-06-14 MED ORDER — POTASSIUM CHLORIDE CRYS ER 20 MEQ PO TBCR: 20.0000 meq | EXTENDED_RELEASE_TABLET | Freq: Every day | ORAL | Status: DC

## 2015-06-14 NOTE — Telephone Encounter (Signed)
Prescriptions sent in

## 2015-06-24 ENCOUNTER — Encounter: Payer: Self-pay | Admitting: Adult Health

## 2015-06-24 ENCOUNTER — Ambulatory Visit (INDEPENDENT_AMBULATORY_CARE_PROVIDER_SITE_OTHER): Payer: Medicare Other | Admitting: Adult Health

## 2015-06-24 VITALS — BP 154/98 | HR 73 | Temp 98.2°F | Ht 66.0 in | Wt 223.2 lb

## 2015-06-24 DIAGNOSIS — Z79891 Long term (current) use of opiate analgesic: Secondary | ICD-10-CM | POA: Diagnosis not present

## 2015-06-24 DIAGNOSIS — M4726 Other spondylosis with radiculopathy, lumbar region: Secondary | ICD-10-CM | POA: Diagnosis not present

## 2015-06-24 DIAGNOSIS — G894 Chronic pain syndrome: Secondary | ICD-10-CM | POA: Diagnosis not present

## 2015-06-24 DIAGNOSIS — J45901 Unspecified asthma with (acute) exacerbation: Secondary | ICD-10-CM | POA: Diagnosis not present

## 2015-06-24 MED ORDER — LEVOFLOXACIN 500 MG PO TABS: 500.0000 mg | ORAL_TABLET | Freq: Every day | ORAL | Status: AC

## 2015-06-24 MED ORDER — PROMETHAZINE-CODEINE 6.25-10 MG/5ML PO SYRP: 5.0000 mL | ORAL_SOLUTION | Freq: Four times a day (QID) | ORAL | Status: DC | PRN

## 2015-06-24 NOTE — Assessment & Plan Note (Addendum)
Slow to resolve asthma exacerbation Plan  Levaquin 500mg  .daily for 7 days  Mucinex DM Twice daily  As needed  Cough Phenergan VC with codeine  1 tsp every 6hrs as needed , may make you sleepy  Fluids and rest  Please contact office for sooner follow up if symptoms do not improve or worsen or seek emergency care  follow up Dr. Elsworth Soho  In 6-8 weeks and As needed

## 2015-06-24 NOTE — Progress Notes (Signed)
   Subjective:    Patient ID: Shelby Anderson, female    DOB: 10-18-1956, 59 y.o.   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  She was evaluated for CAD  at [facility] for syncope.& has a loop recorder - brady & int a - fibn   Significant tests/ events  Spirometry 12/2011 - no airway obstruction -No reversibility with bronchodilator , mod restriction Her husband smokes.. She has positive PPD.  CXR LLL nodular infx - resolved 04/2012  Home sleep test 07/2012 - showed desatn, but could not afford O2    Jan 2015 -admitted  asthma exacerbation, community-acquired pneumonia involving the right upper lobe and RSV bronchitis.  EGD in 03/2011 (Buccini) has shown peptic ulcer disease.  12/2013  barium esophagram - distal esophageal stricture.     stricture dilated (perry) She denies seasonal allergies, post nasal drip or breakthrough heartburn on nexium  Uses SABA 1 x /week 05/2014 pred , zpak Feel dulera has helped -exacerbations have reduced to 2-3/y singulair helps with congestion  04/25/2015 Follow up : Asthma  Returns for 6 month follow up  Complains of  tightness, wheezing x2-3 days, dry cough onset this morning.  Denies any f/c/s, n/v/d, purulent sputum, hemoptysis or chest pain.  Would like to discuss going back on Singulair Feels she has had more flares off Singulair .  Had flare of asthma in earlier spring requiring prednisone .  Leaving for the vacation soon.  Taking Dulera Twice daily  , no missed doses.  Does complain of post nasal drainage.  >> Restarted Singulair  06/24/2015 Acute OV  Patient presents for an acute office visit. Patient complains of a three-week history of cough, congestion, wheezing and nasal drainage. He was called in a prednisone taper and Z-Pak on July 5. Symptoms started to  improve but have gradually been returning over the last few days have been worse. She complains of a productive cough with thick, green gray mucus, shortness of breath, wheezing and tightness.. She remains on Dulera twice daily along with Singulair. She is leaving to go out of town for vacation. She denies any hemoptysis, orthopnea, PND, or fever, or leg swelling.  Review of Systems neg for any significant sore throat, dysphagia, itching, sneezing, nasal congestion or excess/ purulent secretions, fever, chills, sweats, unintended wt loss, pleuritic or exertional cp, hempoptysis, orthopnea pnd or change in chronic leg swelling. Also denies presyncope, palpitations, heartburn, abdominal pain, nausea, vomiting, diarrhea or change in bowel or urinary habits, dysuria,hematuria, rash, arthralgias, visual complaints, headache, numbness weakness or ataxia.     Objective:   Physical Exam  Gen. Pleasant, obese, in no distress ENT - no lesions, no post nasal drip Neck: No JVD, no thyromegaly, no carotid bruits Lungs: no use of accessory muscles, no dullness to percussion, Slight wheeze on forced exp . No stridor , speaks in full sentences  Cardiovascular: Rhythm regular, heart sounds  normal, no murmurs or gallops, no peripheral edema Musculoskeletal: No deformities, no cyanosis or clubbing , no tremors       Assessment & Plan:

## 2015-06-24 NOTE — Patient Instructions (Addendum)
Levaquin 500mg  .daily for 7 days  Mucinex DM Twice daily  As needed  Cough Phenergan VC with codeine  1 tsp every 6hrs as needed , may make you sleepy  Fluids and rest  Please contact office for sooner follow up if symptoms do not improve or worsen or seek emergency care  follow up Dr. Elsworth Soho  In 6-8 weeks and As needed

## 2015-06-28 ENCOUNTER — Telehealth: Payer: Self-pay | Admitting: Pulmonary Disease

## 2015-06-28 MED ORDER — PREDNISONE 10 MG PO TABS: ORAL_TABLET | ORAL | Status: DC

## 2015-06-28 NOTE — Progress Notes (Signed)
Reviewed & agree with plan  

## 2015-06-28 NOTE — Telephone Encounter (Signed)
LM with husband to have patient return call.

## 2015-06-28 NOTE — Telephone Encounter (Signed)
Spoke with pt, states she started a cough syrup and abx after seeing TP on 7/22- was told to call back if she worsens or does not improve.   Pt states she is still coughing up green mucus, running an intermittent low grade fever of approx 101 F.   States that she was told by TP that we would call in prednisone for her if she does not improve; pt is requesting this now. [facility] [place]    Dr. Elsworth Soho please advise on recs.  Thanks!

## 2015-06-28 NOTE — Telephone Encounter (Signed)
Pt returning call and can be reached @ [phone].Shelby Anderson

## 2015-06-28 NOTE — Telephone Encounter (Signed)
Patient called back.  Please call [phone].

## 2015-06-28 NOTE — Telephone Encounter (Signed)
lmtcb X2 for pt.  

## 2015-06-28 NOTE — Telephone Encounter (Signed)
Take 4 tabs  daily with food x 4 days, then 3 tabs daily x 4 days, then 2 tabs daily x 4 days, then 1 tab daily x4 days then stop. #40  

## 2015-06-28 NOTE — Telephone Encounter (Signed)
rx sent to pharmacy, pt aware of recs.  Nothing further needed.

## 2015-07-08 ENCOUNTER — Ambulatory Visit: Payer: Medicare Other | Admitting: Family Medicine

## 2015-07-19 ENCOUNTER — Encounter: Payer: Self-pay | Admitting: Family Medicine

## 2015-07-19 ENCOUNTER — Ambulatory Visit (INDEPENDENT_AMBULATORY_CARE_PROVIDER_SITE_OTHER): Payer: Medicare Other | Admitting: Family Medicine

## 2015-07-19 VITALS — BP 122/78 | HR 69 | Temp 98.8°F | Ht 66.0 in | Wt 229.4 lb

## 2015-07-19 DIAGNOSIS — I10 Essential (primary) hypertension: Secondary | ICD-10-CM

## 2015-07-19 DIAGNOSIS — I499 Cardiac arrhythmia, unspecified: Secondary | ICD-10-CM | POA: Diagnosis not present

## 2015-07-19 DIAGNOSIS — E1159 Type 2 diabetes mellitus with other circulatory complications: Secondary | ICD-10-CM | POA: Insufficient documentation

## 2015-07-19 DIAGNOSIS — K219 Gastro-esophageal reflux disease without esophagitis: Secondary | ICD-10-CM | POA: Diagnosis not present

## 2015-07-19 DIAGNOSIS — I951 Orthostatic hypotension: Secondary | ICD-10-CM | POA: Diagnosis not present

## 2015-07-19 DIAGNOSIS — J4540 Moderate persistent asthma, uncomplicated: Secondary | ICD-10-CM

## 2015-07-19 DIAGNOSIS — Z7689 Persons encountering health services in other specified circumstances: Secondary | ICD-10-CM

## 2015-07-19 DIAGNOSIS — Z7189 Other specified counseling: Secondary | ICD-10-CM

## 2015-07-19 DIAGNOSIS — M549 Dorsalgia, unspecified: Secondary | ICD-10-CM

## 2015-07-19 DIAGNOSIS — G8929 Other chronic pain: Secondary | ICD-10-CM

## 2015-07-19 DIAGNOSIS — I2510 Atherosclerotic heart disease of native coronary artery without angina pectoris: Secondary | ICD-10-CM

## 2015-07-19 DIAGNOSIS — G629 Polyneuropathy, unspecified: Secondary | ICD-10-CM

## 2015-07-19 MED ORDER — ISOSORBIDE MONONITRATE ER 60 MG PO TB24: 60.0000 mg | ORAL_TABLET | Freq: Every day | ORAL | Status: DC

## 2015-07-19 MED ORDER — ATORVASTATIN CALCIUM 40 MG PO TABS: ORAL_TABLET | ORAL | Status: DC

## 2015-07-19 MED ORDER — METOPROLOL TARTRATE 50 MG PO TABS: 50.0000 mg | ORAL_TABLET | Freq: Two times a day (BID) | ORAL | Status: DC

## 2015-07-19 NOTE — Progress Notes (Signed)
Pre visit review using our clinic review tool, if applicable. No additional management support is needed unless otherwise documented below in the visit note. 

## 2015-07-19 NOTE — Progress Notes (Signed)
HPI:  Shelby Anderson is here to establish care.  Last PCP and physical: sees Dr. Toney Rakes in gyn - hx stenotic os, uterine fibroid  Has the following chronic problems that require follow up and concerns today:  GERD: -hx bad reflux, esophageal stricture s/p dilation and gastric ulcer -sees Dr. Henrene Pastor for this -on nexium 40mg  bidcarafate  CAD hx MI x2 per her report/Arrythmia/HTN/HLD/orthostatic hypotension/presyncope: -seeing several cardiologists -meds: asa, metoprolol, fish oil, isosorbide, florinef, K+, lipitor -denies: CP, SOB, DOE, palpitations recently  Asthma: -sees pulmonologist for this -recently had exacerbation and treated with steroids and abx and doing better  Insomnia: -uses benydrl occ for this -hx of low O2 at night  Chronic back pain: -hx DDD and neuropathy -seeing [place] pain management specialist for this -on daily oxycodone 30mg  3 times daily and mobic daily -pain is not well controlled  -had MRI with last PCP, DDD, but no neural foraminal stenosis or canal stenosis -bilat peripheral neuropathy from the knees down -no anemia on labs  ROS negative for unless reported above: fevers, unintentional weight loss, hearing or vision loss, chest pain, palpitations, struggling to breath, hemoptysis, melena, hematochezia, hematuria, falls, loc, si, thoughts of self harm  Past Medical History  Diagnosis Date  . Angina   . Heart murmur   . Mental disorder   . Coronary artery disease     Arrythmia, orthostatic hypotension, HLD, HTN; sees Dr. Einar Gip  . Hypertension   . Recurrent upper respiratory infection (URI)   . Tuberculosis     + TB SKIN TEST  . GERD (gastroesophageal reflux disease)     hx hiatal hernia, stricture and gastric ulcer  . Headache(784.0)   . Pneumonia   . Anxiety and depression   . TMJ (dislocation of temporomandibular joint)   . Difficult intubation     "TMJ & woke up when they were still cutting on me"  . PONV  (postoperative nausea and vomiting)   . Dysrhythmia     sees Dr. Einar Gip and a [facility]ologist at [facility]  . Atrial fibrillation     h/o "AF w/frequent PVCs"  . Myocardial infarction 1980's & 1990;    sees Dr. Einar Gip  . Shortness of breath 11/20/11    "all the time", sees pulmonlogy, ? asthma  . Anemia     "chronic"  . Stomach ulcer     "3 small; found in 05/2011"  . History of migraines     "dx'd when I was in my teens"  . Fibromyalgia     "in my legs"  . Chronic back pain greater than 3 months duration     on chronic narcotics, treated at pain clinic  . Hyperlipemia   . Stenotic cervical os   . Fibroids     Past Surgical History  Procedure Laterality Date  . Carpal tunnel release  unknown    left hand  . Achilles tendon repair  1970's    left ankle  . Arthroscopic repair acl      left knee cap  . Cardiac catheterization      loop recorder  . Post ganglionectomy  1970's    "for migraine headaches"  . Tubal ligation  1980's  . Pouch string  [phone]    "did this 3 times (once w/each pregnancy)"    Family History  Problem Relation Age of Onset  . Malignant hyperthermia Father   . Hypertension Father   . Heart disease Father   . Diabetes Father   .  Cancer Father     skin  . Anesthesia problems Neg Hx   . Hypotension Neg Hx   . Pseudochol deficiency Neg Hx   . Hypertension Mother   . Heart disease Mother   . Cancer Sister     CERVICAL  . Hypertension Sister   . Cancer Brother     MELANOMA  . Heart disease Maternal Grandmother   . Heart disease Maternal Grandfather   . Cancer Paternal Grandmother     ?   Marland Kitchen Heart disease Paternal Grandmother   . Heart disease Paternal Grandfather   . Cancer Brother     LUNG  . Diabetes Sister   . Hypertension Sister   . Heart disease Sister   . Cancer Sister   . Cancer Brother     Social History   Social History  . Marital Status: Married    Spouse Name: N/A  . Number of Children: 3  . Years of Education: N/A    Occupational History  . Retired Therapist, sports    Social History Main Topics  . Smoking status: Never Smoker   . Smokeless tobacco: Never Used  . Alcohol Use: No  . Drug Use: No  . Sexual Activity: Yes    Birth Control/ Protection: Surgical   Other Topics Concern  . None   Social History Narrative     Current outpatient prescriptions:  .  albuterol (PROVENTIL HFA;VENTOLIN HFA) 108 (90 BASE) MCG/ACT inhaler, Inhale 2 puffs into the lungs every 4 (four) hours as needed. Shortness of breath, Disp: 1 Inhaler, Rfl: 3 .  albuterol (PROVENTIL) (2.5 MG/3ML) 0.083% nebulizer solution, Take 3 mLs (2.5 mg total) by nebulization every 6 (six) hours as needed for wheezing or shortness of breath., Disp: 75 mL, Rfl: 12 .  alclomethasone (ACLOVATE) 0.05 % cream, Apply topically 2 (two) times daily., Disp: 60 g, Rfl: 2 .  aspirin 81 MG EC tablet, Take 81 mg by mouth daily. Swallow whole. , Disp: , Rfl:  .  atorvastatin (LIPITOR) 40 MG tablet, TAKE 1 TABLET (40 MG TOTAL) BY MOUTH DAILY., Disp: 90 tablet, Rfl: 1 .  Calcium Carbonate-Vitamin D (CALCIUM + D PO), Take 1 tablet by mouth 3 (three) times daily.  , Disp: , Rfl:  .  Cinnamon 500 MG capsule, Take 1,000 mg by mouth 2 (two) times daily. , Disp: , Rfl:  .  esomeprazole (NEXIUM) 40 MG capsule, Take 40 mg by mouth 2 (two) times daily before a meal., Disp: , Rfl:  .  fludrocortisone (FLORINEF) 0.1 MG tablet, TAKE 1 TABLET BY MOUTH TWICE A DAY, Disp: 60 tablet, Rfl: 5 .  gabapentin (NEURONTIN) 400 MG capsule, 1 capsule by mouth twice daily and 2 capsules at bedtime, Disp: , Rfl:  .  GLUCOSAMINE PO, Take 1 tablet by mouth 2 (two) times daily.  , Disp: , Rfl:  .  ibuprofen (ADVIL,MOTRIN) 200 MG tablet, Take 200 mg by mouth every 6 (six) hours as needed., Disp: , Rfl:  .  isosorbide mononitrate (IMDUR) 60 MG 24 hr tablet, Take 1 tablet (60 mg total) by mouth daily., Disp: 90 tablet, Rfl: 1 .  L-Methylfolate (DEPLIN) 7.5 MG TABS, Take 7.5 mg by mouth daily.  ,  Disp: , Rfl:  .  lidocaine (LIDODERM) 5 %, Place 2 patches onto the skin as needed. Remove & Discard patch within 12 hours or as directed by MD, Disp: , Rfl:  .  meloxicam (MOBIC) 15 MG tablet, TAKE 1 TABLET (15 MG TOTAL)  BY MOUTH DAILY., Disp: 30 tablet, Rfl: 4 .  metoprolol (LOPRESSOR) 50 MG tablet, Take 1 tablet (50 mg total) by mouth 2 (two) times daily., Disp: 180 tablet, Rfl: 1 .  mometasone-formoterol (DULERA) 200-5 MCG/ACT AERO, Inhale 2 puffs into the lungs 2 (two) times daily., Disp: 1 Inhaler, Rfl: 4 .  montelukast (SINGULAIR) 10 MG tablet, Take 1 tablet (10 mg total) by mouth at bedtime., Disp: 30 tablet, Rfl: 5 .  Multiple Vitamin (MULITIVITAMIN WITH MINERALS) TABS, Take 1 tablet by mouth daily.  , Disp: , Rfl:  .  NONFORMULARY OR COMPOUNDED ITEM, Estradiol .02% 1 ML Prefilled Applicator Sig: apply vaginally twice a week #90 Day Supply with 4 refills, Disp: 1 each, Rfl: 4 .  Omega-3 Fatty Acids (FISH OIL) 1000 MG CAPS, Take 1,000 mg by mouth 2 (two) times daily., Disp: , Rfl:  .  Oxycodone HCl 20 MG TABS, Take 30 mg by mouth every 4 (four) hours as needed (pain.). , Disp: , Rfl:  .  potassium chloride SA (KLOR-CON M20) 20 MEQ tablet, Take 1 tablet (20 mEq total) by mouth daily., Disp: 30 tablet, Rfl: 3 .  progesterone (PROMETRIUM) 200 MG capsule, Take one tablet the first 12 days of the month, Disp: 36 capsule, Rfl: 4 .  promethazine (PHENERGAN) 25 MG tablet, TAKE 1 TABLET (25 MG TOTAL) BY MOUTH AT BEDTIME AS NEEDED FOR NAUSEA OR VOMITING., Disp: 30 tablet, Rfl: 1 .  sucralfate (CARAFATE) 1 GM/10ML suspension, Take 1 g by mouth 4 (four) times daily -  with meals and at bedtime., Disp: , Rfl:  .  [DISCONTINUED] Potassium Chloride ER 20 MEQ TBCR, Take 1 tablet by mouth daily., Disp: 30 tablet, Rfl: 3  EXAM:  Filed Vitals:   07/19/15 1330  BP: 122/78  Pulse: 69  Temp: 98.8 F (37.1 C)    Body mass index is 37.04 kg/(m^2).  GENERAL: vitals reviewed and listed above, alert,  oriented, appears well hydrated and in no acute distress  HEENT: atraumatic, conjunttiva clear, no obvious abnormalities on inspection of external nose and ears  NECK: no obvious masses on inspection  LUNGS: clear to auscultation bilaterally, no wheezes, rales or rhonchi, good air movement  CV: HRRR, no peripheral edema  MS: moves all extremities without noticeable abnormality  PSYCH: pleasant and cooperative, no obvious depression or anxiety  ASSESSMENT AND PLAN:  Discussed the following assessment and plan:  Essential hypertension Cardiac arrhythmia, unspecified cardiac arrhythmia type Orthostatic hypotension -managed by 2 cardiologists, advised her florinef be prescribed by her cardiologist, advised assistant to obtain records  Gastroesophageal reflux disease, esophagitis presence not specified -sees GI, cont current treatment and follow up with GI  Chronic back pain Neuropathy -advised opoids are not usually and good choice for chronic musculoskeletal pain and the risk associated with them. Advised she discuss safely stopping these medications with her pain clinc if not provided significant relief. Advised we do not have a perfect or even a good answer for complete resolution of chronic musculoskeletal pain and a comprehensive approach with exercise, psychiatric care and specialist care may help. Adivse needs further eval of her LE paresthesias either with her specialist or I can refer to neurology. She plans to talk with her specialist whom is currently treating this. Advised of risks with daily NSAIDS.  Coronary artery disease involving native coronary artery of native heart without angina pectoris -sees cardiologist  Asthma, moderate persistent, uncomplicated -improving, sees pulmonologist  Encounter to establish care -We reviewed the PMH, PSH, FH,  SH, Meds and Allergies. -We provided refills for any medications we will prescribe as needed. -We addressed current concerns  per orders and patient instructions. -We have asked for records for pertinent exams, studies, vaccines and notes from previous providers. -We have advised patient to follow up per instructions below.   -Patient advised to return or notify a doctor immediately if symptoms worsen or persist or new concerns arise.  Patient Instructions  BEFORE YOU LEAVE: -schedule physical in November - please come fasting and we will check labs that day  -please provided [place] with the name of your back and pain doctor   We recommend the following healthy lifestyle measures: - eat a healthy diet consisting of lots of vegetables, fruits, beans, nuts, seeds, healthy meats such as white chicken and fish - avoid fried foods, fast food, processed foods, sodas, red meet and other fattening foods.  - get a least 150 minutes of aerobic exercise per week.       Colin Benton R.

## 2015-07-19 NOTE — Patient Instructions (Signed)
BEFORE YOU LEAVE: -schedule physical in November - please come fasting and we will check labs that day  -please provided [place] with the name of your back and pain doctor   We recommend the following healthy lifestyle measures: - eat a healthy diet consisting of lots of vegetables, fruits, beans, nuts, seeds, healthy meats such as white chicken and fish - avoid fried foods, fast food, processed foods, sodas, red meet and other fattening foods.  - get a least 150 minutes of aerobic exercise per week.

## 2015-07-26 ENCOUNTER — Telehealth: Payer: Self-pay | Admitting: *Deleted

## 2015-07-26 MED ORDER — MELOXICAM 7.5 MG PO TABS: 7.5000 mg | ORAL_TABLET | Freq: Every day | ORAL | Status: DC | PRN

## 2015-07-26 NOTE — Telephone Encounter (Signed)
Please let her know, as we discussed at her appt, this medication has CV risks and bleeding risks. Also she has a listed allergy to a similar medication though told me she tolerates this. I advised she use as little as possible and sent in a lower dose to try. Do not take with other similar medications such as ibuprofen.

## 2015-07-26 NOTE — Telephone Encounter (Signed)
Patient is requesting a refill of meloxicam (MOBIC) 15 MG tablet [facility] [address]

## 2015-07-27 NOTE — Telephone Encounter (Signed)
Patient is aware 

## 2015-07-29 ENCOUNTER — Other Ambulatory Visit: Payer: Self-pay | Admitting: Adult Health

## 2015-07-29 MED ORDER — MONTELUKAST SODIUM 10 MG PO TABS: 10.0000 mg | ORAL_TABLET | Freq: Every day | ORAL | Status: DC

## 2015-07-29 NOTE — Telephone Encounter (Signed)
Received Paper refill request for montelukast sod 10mg  tablet at bedtime  Will refill per TP's OV on 04/25/15 Nothing further needed

## 2015-08-17 ENCOUNTER — Ambulatory Visit: Payer: Medicare Other | Admitting: Pulmonary Disease

## 2015-08-19 ENCOUNTER — Other Ambulatory Visit: Payer: Self-pay | Admitting: Family

## 2015-08-22 NOTE — Telephone Encounter (Signed)
Patient requesting refill on MELOXICAM 15 MG tablet.

## 2015-08-23 ENCOUNTER — Other Ambulatory Visit: Payer: Self-pay | Admitting: Internal Medicine

## 2015-08-26 ENCOUNTER — Ambulatory Visit (INDEPENDENT_AMBULATORY_CARE_PROVIDER_SITE_OTHER): Payer: Medicare Other | Admitting: Adult Health

## 2015-08-26 ENCOUNTER — Encounter: Payer: Self-pay | Admitting: Adult Health

## 2015-08-26 ENCOUNTER — Other Ambulatory Visit (INDEPENDENT_AMBULATORY_CARE_PROVIDER_SITE_OTHER): Payer: Medicare Other

## 2015-08-26 ENCOUNTER — Ambulatory Visit (INDEPENDENT_AMBULATORY_CARE_PROVIDER_SITE_OTHER)
Admission: RE | Admit: 2015-08-26 | Discharge: 2015-08-26 | Disposition: A | Discharge status: Home / Self Care | Payer: Medicare Other | Source: Ambulatory Visit | Attending: Adult Health | Admitting: Adult Health

## 2015-08-26 VITALS — BP 116/68 | HR 59 | Temp 98.3°F | Ht 66.0 in | Wt 225.0 lb

## 2015-08-26 DIAGNOSIS — R0602 Shortness of breath: Secondary | ICD-10-CM | POA: Diagnosis not present

## 2015-08-26 DIAGNOSIS — J4540 Moderate persistent asthma, uncomplicated: Secondary | ICD-10-CM | POA: Diagnosis not present

## 2015-08-26 DIAGNOSIS — R0600 Dyspnea, unspecified: Secondary | ICD-10-CM

## 2015-08-26 DIAGNOSIS — Z23 Encounter for immunization: Secondary | ICD-10-CM | POA: Diagnosis not present

## 2015-08-26 LAB — BASIC METABOLIC PANEL
BUN: 12 mg/dL (ref 6–23)
CO2: 36 mEq/L — ABNORMAL HIGH (ref 19–32)
CREATININE: 0.66 mg/dL (ref 0.40–1.20)
Calcium: 9.5 mg/dL (ref 8.4–10.5)
Chloride: 102 mEq/L (ref 96–112)
GFR: 97.96 mL/min (ref >60.00)
Glucose, Bld: 101 mg/dL — ABNORMAL HIGH (ref 70–99)
Potassium: 3.7 mEq/L (ref 3.5–5.1)
Sodium: 143 mEq/L (ref 135–145)

## 2015-08-26 LAB — BRAIN NATRIURETIC PEPTIDE: PRO B NATRI PEPTIDE: 56 pg/mL (ref 0.0–100.0)

## 2015-08-26 LAB — NITRIC OXIDE: Nitric Oxide: 12

## 2015-08-26 NOTE — Progress Notes (Signed)
   Subjective:    Patient ID: Shelby Anderson, female    DOB: [birth year], 59 y.o.   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  She was evaluated for CAD  at [facility] for syncope.& has a loop recorder - brady & int a - fibn   Significant tests/ events  Spirometry 12/2011 - no airway obstruction -No reversibility with bronchodilator , mod restriction Her husband smokes.. She has positive PPD.  CXR LLL nodular infx - resolved 04/2012  Home sleep test 07/2012 - showed desatn, but could not afford O2    Jan 2015 -admitted  asthma exacerbation, community-acquired pneumonia involving the right upper lobe and RSV bronchitis.  EGD in 03/2011 (Buccini) has shown peptic ulcer disease.  12/2013  barium esophagram - distal esophageal stricture.   stricture dilated (perry) >> Restarted Singulair 04/2015   08/26/2015 Follow up : Asthma never smoker  Pt returns for 2 month follow up .  Remains on [place] , Singular .  Keeps a dry cough most days. Heat makes her worse.  Spirometry today shows FEV1 62, ratio83 , FVC 59 FENO 12 today .  Weight is 225 BMI 36 Prev MI x 2  On metoprolol Twice daily  . Seen by cards ~1 yr ago.  Gets winded easily esp at night and walking . Denies chest pain.  Has triggers at home with smoke .  Has reflux , controlled on PPI .  Says she has had a sleep study in past in 2013 no OSA but desats, rec O2 but could not afford.  We discussed ONO as she has insurance coverage now.  No chest pain, fever, n/v/d or hemoptysis  CXR today with no acute process     Review of Systems neg for any significant sore throat, dysphagia, itching, sneezing, nasal congestion or excess/ purulent secretions, fever, chills, sweats, unintended wt loss, pleuritic or exertional cp, hempoptysis, orthopnea pnd or change in  chronic leg swelling. Also denies presyncope, palpitations, heartburn, abdominal pain, nausea, vomiting, diarrhea or change in bowel or urinary habits, dysuria,hematuria, rash, arthralgias, visual complaints, headache, numbness weakness or ataxia.     Objective:   Physical Exam  Gen. Pleasant, obese, in no distress ENT - no lesions, no post nasal drip Neck: No JVD, no thyromegaly, no carotid bruits Lungs: no use of accessory muscles, no dullness to percussion, Slight wheeze on forced exp . No stridor , speaks in full sentences  Cardiovascular: Rhythm regular, heart sounds  normal, no murmurs or gallops, no peripheral edema Musculoskeletal: No deformities, no cyanosis or clubbing , no tremors   CXR 08/26/15 NAD Personally reviewed     Assessment & Plan:

## 2015-08-26 NOTE — Patient Instructions (Addendum)
Continue on Dulera 2 puffs Twice daily   Continue on Singulair daily  Chest xray and labs today  Follow up with cardiology as discussed  Follow up Dr. Elsworth Soho  In 3 months and As needed   Please contact office for sooner follow up if symptoms do not improve or worsen or seek emergency care  Flu shot  ONO set up

## 2015-08-29 DIAGNOSIS — M4726 Other spondylosis with radiculopathy, lumbar region: Secondary | ICD-10-CM | POA: Diagnosis not present

## 2015-08-29 DIAGNOSIS — Z79891 Long term (current) use of opiate analgesic: Secondary | ICD-10-CM | POA: Diagnosis not present

## 2015-08-29 DIAGNOSIS — G894 Chronic pain syndrome: Secondary | ICD-10-CM | POA: Diagnosis not present

## 2015-08-29 NOTE — Progress Notes (Signed)
Quick Note:  Called spoke with patient, advised of cxr results / recs as stated by TP. Pt verbalized her understanding and denied any questions. ______ 

## 2015-08-29 NOTE — Progress Notes (Signed)
Quick Note:  Called spoke with patient, advised of lab results / recs as stated by TP. Pt verbalized her understanding and denied any questions. ______ 

## 2015-09-01 DIAGNOSIS — R0600 Dyspnea, unspecified: Secondary | ICD-10-CM | POA: Insufficient documentation

## 2015-09-01 DIAGNOSIS — R0602 Shortness of breath: Secondary | ICD-10-CM | POA: Insufficient documentation

## 2015-09-01 NOTE — Assessment & Plan Note (Signed)
Dyspnea does not appear to be asthma related Spirometry shows more restrictive process ? Obesity related CXR w/ nad  BNP was low  Metoprolol could be aggravating symtpoms but unclear how much this is contributing to her symptoms as no active wheezing  FENO is low.  Recommended she follow up with cards to make sure dyspnea not card in nature  Check ONO   Plan  Continue on Dulera 2 puffs Twice daily   Continue on Singulair daily  Chest xray and labs today  Follow up with cardiology as discussed  Follow up Dr. Elsworth Soho  In 3 months and As needed   Please contact office for sooner follow up if symptoms do not improve or worsen or seek emergency care  Flu shot  ONO set up

## 2015-09-04 DIAGNOSIS — J45909 Unspecified asthma, uncomplicated: Secondary | ICD-10-CM | POA: Diagnosis not present

## 2015-09-04 DIAGNOSIS — R0602 Shortness of breath: Secondary | ICD-10-CM | POA: Diagnosis not present

## 2015-09-15 ENCOUNTER — Encounter: Payer: Self-pay | Admitting: Adult Health

## 2015-09-15 DIAGNOSIS — J9610 Chronic respiratory failure, unspecified whether with hypoxia or hypercapnia: Secondary | ICD-10-CM

## 2015-09-15 HISTORY — DX: Chronic respiratory failure, unspecified whether with hypoxia or hypercapnia: J96.10

## 2015-09-16 ENCOUNTER — Telehealth: Payer: Self-pay | Admitting: Adult Health

## 2015-09-16 DIAGNOSIS — G4734 Idiopathic sleep related nonobstructive alveolar hypoventilation: Secondary | ICD-10-CM

## 2015-09-16 NOTE — Telephone Encounter (Signed)
Per TP after reviewing ONO Positive ONO Begin O2 at 2L qhs    Called and spoke with pt. Reviewed results and recs.  Order was placed for O2 Pt is aware that PCC's will contact her Pt voiced understanding and had no further questions Nothing further needed  Will sign off on message

## 2015-09-23 DIAGNOSIS — J45909 Unspecified asthma, uncomplicated: Secondary | ICD-10-CM | POA: Diagnosis not present

## 2015-09-29 DIAGNOSIS — J189 Pneumonia, unspecified organism: Secondary | ICD-10-CM | POA: Diagnosis not present

## 2015-09-29 DIAGNOSIS — R0902 Hypoxemia: Secondary | ICD-10-CM | POA: Diagnosis not present

## 2015-09-29 DIAGNOSIS — J4540 Moderate persistent asthma, uncomplicated: Secondary | ICD-10-CM | POA: Diagnosis not present

## 2015-09-29 DIAGNOSIS — J45909 Unspecified asthma, uncomplicated: Secondary | ICD-10-CM | POA: Diagnosis not present

## 2015-10-04 ENCOUNTER — Ambulatory Visit
Admission: RE | Admit: 2015-10-04 | Discharge: 2015-10-04 | Disposition: A | Discharge status: Home / Self Care | Payer: Medicare Other | Source: Ambulatory Visit | Attending: Anesthesiology | Admitting: Anesthesiology

## 2015-10-04 ENCOUNTER — Encounter: Payer: Self-pay | Admitting: Adult Health

## 2015-10-04 ENCOUNTER — Other Ambulatory Visit: Payer: Self-pay | Admitting: Anesthesiology

## 2015-10-04 DIAGNOSIS — M25462 Effusion, left knee: Secondary | ICD-10-CM | POA: Diagnosis not present

## 2015-10-04 DIAGNOSIS — M25562 Pain in left knee: Secondary | ICD-10-CM

## 2015-10-04 DIAGNOSIS — M179 Osteoarthritis of knee, unspecified: Secondary | ICD-10-CM | POA: Diagnosis not present

## 2015-10-15 ENCOUNTER — Other Ambulatory Visit: Payer: Self-pay | Admitting: Internal Medicine

## 2015-10-15 ENCOUNTER — Other Ambulatory Visit: Payer: Self-pay | Admitting: Family

## 2015-10-17 ENCOUNTER — Other Ambulatory Visit: Payer: Self-pay | Admitting: Family

## 2015-10-24 ENCOUNTER — Other Ambulatory Visit: Payer: Self-pay | Admitting: Internal Medicine

## 2015-10-24 ENCOUNTER — Other Ambulatory Visit: Payer: Self-pay | Admitting: Family

## 2015-10-24 DIAGNOSIS — J45909 Unspecified asthma, uncomplicated: Secondary | ICD-10-CM | POA: Diagnosis not present

## 2015-10-24 MED ORDER — ESOMEPRAZOLE MAGNESIUM 40 MG PO CPDR: 40.0000 mg | DELAYED_RELEASE_CAPSULE | Freq: Two times a day (BID) | ORAL | Status: DC

## 2015-10-24 NOTE — Telephone Encounter (Signed)
Rx sent. Patient needs office visit for further refills. 

## 2015-10-26 DIAGNOSIS — M47816 Spondylosis without myelopathy or radiculopathy, lumbar region: Secondary | ICD-10-CM | POA: Diagnosis not present

## 2015-10-26 DIAGNOSIS — G894 Chronic pain syndrome: Secondary | ICD-10-CM | POA: Diagnosis not present

## 2015-10-26 DIAGNOSIS — M4726 Other spondylosis with radiculopathy, lumbar region: Secondary | ICD-10-CM | POA: Diagnosis not present

## 2015-10-26 DIAGNOSIS — Z79891 Long term (current) use of opiate analgesic: Secondary | ICD-10-CM | POA: Diagnosis not present

## 2015-10-31 ENCOUNTER — Other Ambulatory Visit: Payer: Self-pay | Admitting: Anesthesiology

## 2015-10-31 DIAGNOSIS — M25562 Pain in left knee: Secondary | ICD-10-CM

## 2015-11-03 ENCOUNTER — Encounter: Payer: Self-pay | Admitting: Family Medicine

## 2015-11-03 ENCOUNTER — Ambulatory Visit (INDEPENDENT_AMBULATORY_CARE_PROVIDER_SITE_OTHER): Payer: Medicare Other | Admitting: Family Medicine

## 2015-11-03 VITALS — BP 108/80 | HR 76 | Temp 98.6°F | Ht 65.0 in | Wt 221.3 lb

## 2015-11-03 DIAGNOSIS — R739 Hyperglycemia, unspecified: Secondary | ICD-10-CM | POA: Diagnosis not present

## 2015-11-03 DIAGNOSIS — E785 Hyperlipidemia, unspecified: Secondary | ICD-10-CM

## 2015-11-03 DIAGNOSIS — J9610 Chronic respiratory failure, unspecified whether with hypoxia or hypercapnia: Secondary | ICD-10-CM | POA: Diagnosis not present

## 2015-11-03 DIAGNOSIS — J069 Acute upper respiratory infection, unspecified: Secondary | ICD-10-CM

## 2015-11-03 DIAGNOSIS — Z0000 Encounter for general adult medical examination without abnormal findings: Secondary | ICD-10-CM | POA: Diagnosis not present

## 2015-11-03 DIAGNOSIS — F330 Major depressive disorder, recurrent, mild: Secondary | ICD-10-CM

## 2015-11-03 DIAGNOSIS — E669 Obesity, unspecified: Secondary | ICD-10-CM

## 2015-11-03 DIAGNOSIS — G629 Polyneuropathy, unspecified: Secondary | ICD-10-CM

## 2015-11-03 DIAGNOSIS — M549 Dorsalgia, unspecified: Secondary | ICD-10-CM

## 2015-11-03 DIAGNOSIS — G8929 Other chronic pain: Secondary | ICD-10-CM

## 2015-11-03 DIAGNOSIS — I2510 Atherosclerotic heart disease of native coronary artery without angina pectoris: Secondary | ICD-10-CM | POA: Diagnosis not present

## 2015-11-03 DIAGNOSIS — I10 Essential (primary) hypertension: Secondary | ICD-10-CM | POA: Diagnosis not present

## 2015-11-03 DIAGNOSIS — I951 Orthostatic hypotension: Secondary | ICD-10-CM

## 2015-11-03 LAB — BASIC METABOLIC PANEL
BUN: 14 mg/dL (ref 6–23)
CHLORIDE: 102 meq/L (ref 96–112)
CO2: 35 mEq/L — ABNORMAL HIGH (ref 19–32)
Calcium: 9.6 mg/dL (ref 8.4–10.5)
Creatinine, Ser: 0.63 mg/dL (ref 0.40–1.20)
GFR: 103.29 mL/min (ref >60.00)
GLUCOSE: 85 mg/dL (ref 70–99)
POTASSIUM: 4.5 meq/L (ref 3.5–5.1)
Sodium: 145 mEq/L (ref 135–145)

## 2015-11-03 LAB — LIPID PANEL
CHOLESTEROL: 123 mg/dL (ref 0–200)
HDL: 34.2 mg/dL — AB (ref >39.00)
LDL Cholesterol: 67 mg/dL (ref 0–99)
NonHDL: 88.9
TRIGLYCERIDES: 112 mg/dL (ref 0.0–149.0)
Total CHOL/HDL Ratio: 4
VLDL: 22.4 mg/dL (ref 0.0–40.0)

## 2015-11-03 LAB — HEMOGLOBIN A1C: Hgb A1c MFr Bld: 6.8 % — ABNORMAL HIGH (ref 4.6–6.5)

## 2015-11-03 NOTE — Patient Instructions (Signed)
BEFORE YOU LEAVE: -labs -follow up in 4 months  -We have ordered labs or studies at this visit. It can take up to 1-2 weeks for results and processing. We will contact you with instructions IF your results are abnormal. Normal results will be released to your [facility]. If you have not heard from [place] or can not find your results in [facility] in 2 weeks please contact our office.  We recommend the following healthy lifestyle measures: - eat a healthy whole foods diet consisting of regular small meals composed of vegetables, fruits, beans, nuts, seeds, healthy meats such as white chicken and fish and whole grains.  - avoid sweets, white starchy foods, fried foods, fast food, processed foods, sodas, red meet and other fattening foods.  - get a least 150-300 minutes of aerobic exercise per week.   Follow up yearly with your gynecologist for pelvic and breast exams  # provided in brochure for dermatologist - please call to schedule appointment for removal of lesion on the scalp and skin exam

## 2015-11-03 NOTE — Progress Notes (Signed)
Pre visit review using our clinic review tool, if applicable. No additional management support is needed unless otherwise documented below in the visit note. 

## 2015-11-03 NOTE — Progress Notes (Addendum)
HPI:  Here for Preventive Health Care and follow up - sees scanned for for functional status, health care providers, health intake information.  -Concerns and/or follow up today:    sees Dr. Toney Rakes in gyn - hx stenotic os, uterine fibroid  GERD: -hx bad reflux, esophageal stricture s/p dilation and gastric ulcer -sees Dr. Henrene Pastor for this -on nexium 40mg  bidcarafate  CAD hx MI x2 per her report/Arrythmia/HTN/HLD/orthostatic hypotension/presyncope: -seeing several cardiologists -meds: asa, metoprolol, fish oil, isosorbide, florinef, K+, lipitor -denies: CP, SOB, DOE, palpitations recently  Asthma: -sees pulmonologist for this -recently had exacerbation and treated with steroids and abx and doing better  Insomnia: -uses benydrl occ for this -hx of low O2 at night  Chronic back pain: -hx DDD and neuropathy -seeing [place] pain management specialist for this -on daily oxycodone 30mg  3 times daily and mobic daily -pain is not well controlled  -had MRI with last PCP, DDD, but no neural foraminal stenosis or canal stenosis -bilat peripheral neuropathy from the knees down -no anemia on labs  URI: -nasal congestion, ears full for several days, R ear pain -denies: fevers, chills, worsening SOB, thick mucus granddaughters with same  MDD: -identified on screening forms - see scanned document -reports has had her whole life -mildly constant depressed mood and hopeless feelings at times, no SI, panic or manic symptoms -takes supplement for this but not helping  -Diet: variety of foods, balance and well rounded, larger portion sizes  -Exercise: no regular exercise  -Diabetes and Dyslipidemia Screening:Fasting x24 hours today  -Vaccines: UTD  -pap history: sees gyn - reports sees Dr. 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 Toney Rakes for breast exams Last colon cancer screening: done  -Alcohol, Tobacco, drug use: see social history  Functional status, FH, PMH, PSH, SH, advanced dir, other providers reviewed and updated - see scanned documentation.  Review of Systems - no fevers, unintentional weight loss, vision loss, hearing loss, chest pain, sob, hemoptysis, melena, hematochezia, hematuria, genital discharge, changing or concerning skin lesions, bleeding, bruising, loc, thoughts of self harm or SI  Past Medical History  Diagnosis Date  . Angina   . Heart murmur   . Mental disorder   . Coronary artery disease     Arrythmia, orthostatic hypotension, HLD, HTN; sees Dr. Einar Gip  . Hypertension   . Recurrent upper respiratory infection (URI)   . Tuberculosis     + TB SKIN TEST  . GERD (gastroesophageal reflux disease)     hx hiatal hernia, stricture and gastric ulcer  . Headache(784.0)   . Pneumonia   . Anxiety and depression   . TMJ (dislocation of temporomandibular joint)   . Difficult intubation     "TMJ & woke up when they were still cutting on me"  . PONV (postoperative nausea and vomiting)   . Dysrhythmia     sees Dr. Einar Gip and a cardiologist at [facility]  . Atrial fibrillation ([place])     h/o "AF w/frequent PVCs"  . Myocardial infarction [facility]) 1980's & 1990;    sees Dr. Einar Gip  . Shortness of breath 11/20/11    "all the time", sees pulmonlogy, ? asthma  . Anemia     "chronic"  . Stomach ulcer     "3 small; found in 05/2011"  . History of migraines     "  dx'd when I was in my teens"  . Fibromyalgia     "in my legs"  . Chronic back pain greater than 3 months duration     on chronic narcotics, treated at pain clinic  . Hyperlipemia   . Stenotic cervical os   . Fibroids     Past Surgical History  Procedure Laterality Date  . Carpal tunnel release  unknown    left hand  . Achilles tendon repair  1970's    left ankle  . Arthroscopic repair acl      left knee cap  . Cardiac  catheterization      loop recorder  . Post ganglionectomy  1970's    "for migraine headaches"  . Tubal ligation  1980's  . Pouch string  [phone]    "did this 3 times (once w/each pregnancy)"    Family History  Problem Relation Age of Onset  . Malignant hyperthermia Father   . Hypertension Father   . Heart disease Father   . Diabetes Father   . Cancer Father     skin  . Anesthesia problems Neg Hx   . Hypotension Neg Hx   . Pseudochol deficiency Neg Hx   . Hypertension Mother   . Heart disease Mother   . Cancer Sister     CERVICAL  . Hypertension Sister   . Cancer Brother     MELANOMA  . Heart disease Maternal Grandmother   . Heart disease Maternal Grandfather   . Cancer Paternal Grandmother     ?   Marland Kitchen Heart disease Paternal Grandmother   . Heart disease Paternal Grandfather   . Cancer Brother     LUNG  . Diabetes Sister   . Hypertension Sister   . Heart disease Sister   . Cancer Sister   . Cancer Brother     Social History   Social History  . Marital Status: Married    Spouse Name: N/A  . Number of Children: 3  . Years of Education: N/A   Occupational History  . Retired Therapist, sports    Social History Main Topics  . Smoking status: Never Smoker   . Smokeless tobacco: Never Used  . Alcohol Use: No  . Drug Use: No  . Sexual Activity: Yes    Birth Control/ Protection: Surgical   Other Topics Concern  . None   Social History Narrative     Current outpatient prescriptions:  .  albuterol (PROVENTIL HFA;VENTOLIN HFA) 108 (90 BASE) MCG/ACT inhaler, Inhale 2 puffs into the lungs every 4 (four) hours as needed. Shortness of breath, Disp: 1 Inhaler, Rfl: 3 .  albuterol (PROVENTIL) (2.5 MG/3ML) 0.083% nebulizer solution, Take 3 mLs (2.5 mg total) by nebulization every 6 (six) hours as needed for wheezing or shortness of breath., Disp: 75 mL, Rfl: 12 .  alclomethasone (ACLOVATE) 0.05 % cream, Apply topically 2 (two) times daily., Disp: 60 g, Rfl: 2 .  aspirin 81 MG EC  tablet, Take 81 mg by mouth daily. Swallow whole. , Disp: , Rfl:  .  atorvastatin (LIPITOR) 40 MG tablet, TAKE 1 TABLET (40 MG TOTAL) BY MOUTH DAILY., Disp: 90 tablet, Rfl: 1 .  Calcium Carbonate-Vitamin D (CALCIUM + D PO), Take 1 tablet by mouth 3 (three) times daily.  , Disp: , Rfl:  .  Cinnamon 500 MG capsule, Take 1,000 mg by mouth 2 (two) times daily. , Disp: , Rfl:  .  esomeprazole (NEXIUM) 40 MG capsule, Take 1 capsule (40 mg total)  by mouth 2 (two) times daily., Disp: 60 capsule, Rfl: 0 .  Eszopiclone (ESZOPICLONE) 3 MG TABS, Take 3 mg by mouth at bedtime as needed., Disp: , Rfl:  .  fludrocortisone (FLORINEF) 0.1 MG tablet, TAKE 1 TABLET BY MOUTH TWICE A DAY, Disp: 60 tablet, Rfl: 5 .  gabapentin (NEURONTIN) 400 MG capsule, 1 capsule by mouth twice daily and 2 capsules at bedtime, Disp: , Rfl:  .  GLUCOSAMINE PO, Take 1 tablet by mouth 2 (two) times daily.  , Disp: , Rfl:  .  isosorbide mononitrate (IMDUR) 60 MG 24 hr tablet, Take 1 tablet (60 mg total) by mouth daily., Disp: 90 tablet, Rfl: 1 .  KLOR-CON M20 20 MEQ tablet, TAKE 1 TABLET (20 MEQ TOTAL) BY MOUTH DAILY., Disp: 30 tablet, Rfl: 3 .  L-Methylfolate (DEPLIN) 7.5 MG TABS, Take 7.5 mg by mouth daily.  , Disp: , Rfl:  .  lidocaine (LIDODERM) 5 %, Place 2 patches onto the skin as needed. Remove & Discard patch within 12 hours or as directed by MD, Disp: , Rfl:  .  meloxicam (MOBIC) 7.5 MG tablet, Take 1 tablet (7.5 mg total) by mouth daily as needed for pain., Disp: 30 tablet, Rfl: 3 .  metoprolol (LOPRESSOR) 50 MG tablet, Take 1 tablet (50 mg total) by mouth 2 (two) times daily., Disp: 180 tablet, Rfl: 1 .  mometasone-formoterol (DULERA) 200-5 MCG/ACT AERO, Inhale 2 puffs into the lungs 2 (two) times daily., Disp: 1 Inhaler, Rfl: 4 .  montelukast (SINGULAIR) 10 MG tablet, Take 1 tablet (10 mg total) by mouth at bedtime., Disp: 30 tablet, Rfl: 5 .  Multiple Vitamin (MULITIVITAMIN WITH MINERALS) TABS, Take 1 tablet by mouth daily.  ,  Disp: , Rfl:  .  NONFORMULARY OR COMPOUNDED ITEM, Estradiol .02% 1 ML Prefilled Applicator Sig: apply vaginally twice a week #90 Day Supply with 4 refills, Disp: 1 each, Rfl: 4 .  Omega-3 Fatty Acids (FISH OIL) 1000 MG CAPS, Take 1,000 mg by mouth 2 (two) times daily., Disp: , Rfl:  .  Oxycodone HCl 20 MG TABS, Take 30 mg by mouth every 4 (four) hours as needed (pain.). , Disp: , Rfl:  .  progesterone (PROMETRIUM) 200 MG capsule, Take one tablet the first 12 days of the month, Disp: 36 capsule, Rfl: 4 .  promethazine (PHENERGAN) 25 MG tablet, TAKE 1 TABLET BY MOUTH AT BEDTIME AS NEEDED FOR NAUSEA AND VOMITING, Disp: 30 tablet, Rfl: 1 .  sucralfate (CARAFATE) 1 GM/10ML suspension, Take 1 g by mouth 4 (four) times daily -  with meals and at bedtime., Disp: , Rfl:  .  [DISCONTINUED] Potassium Chloride ER 20 MEQ TBCR, Take 1 tablet by mouth daily., Disp: 30 tablet, Rfl: 3  EXAM:  Filed Vitals:   11/03/15 1309  BP: 108/80  Pulse: 76  Temp: 98.6 F (37 C)  Body mass index is 36.83 kg/(m^2).   GENERAL: vitals reviewed and listed below, alert, oriented, appears well hydrated and in no acute distress  HEENT: head atraumatic, PERRLA, visual acuity grossly intact,  normal appearance of eyes, ears, nose and mouth. moist mucus membranes. normal appearance of ear canals and TMs, clear nasal congestion, mild post oropharyngeal erythema with PND, no tonsillar edema or exudate, no sinus TTP  NECK: supple, no masses or lymphadenopathy  LUNGS: clear to auscultation bilaterally, no rales, rhonchi or wheeze  CV: HRRR, no peripheral edema or cyanosis, normal pedal pulses  BREAST: offered, declined  ABDOMEN: bowel sounds normal, soft,  non tender to palpation, no masses, no rebound or guarding  GU: declined, does with gyn  SKIN: declined, plans to see derm for remove mole she has had on scalp for many years - becomes irritated with brushing hair, large purplish colored papule L frontal scalp  region  MS: normal gait, moves all extremities normally  NEURO: CN II-XII grossly intact, normal muscle strength and sensation to light touch on extremities  PSYCH: cog function grossly intact, normal affect, pleasant and cooperative  ASSESSMENT AND PLAN:  Discussed the following assessment and plan:  Visit for preventive health examination See scanned documentation, chart and patient instrucitons - Plan: HIV antibody (with reflex), Hepatitis C antibody -Discussed and advised all [place] preventive services health task force level A and B recommendations for age, sex and risks. -Advised at least 150 minutes of exercise per week and a healthy diet low in saturated fats and sweets and consisting of fresh fruits and vegetables, lean meats such as fish and white chicken and whole grains. 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 Everitt Amber and Dr. Rosita Fire  Chronic respiratory failure, unspecified whether with hypoxia or hypercapnia ([place]) -sees Tammy Parrett  Neuropathy ([place]) Chronic pain -stable, sees pain specialist - Dr. Hardin Negus -have advised her of sig risks with chronic narcotics  Hyperlipemia - Plan: Lipid Panel Obesity - Plan: Hemoglobin A1c -lifestyle recs  VURI: -mild, no breathing issues  -supportive care for nasal congestion - return precuations  Orders Placed This Encounter  Procedures  . Lipid Panel  . Hemoglobin A1c  . Basic metabolic panel  . HIV antibody (with reflex)  . Hepatitis C  antibody    Patient advised to return to clinic immediately if symptoms worsen or persist or new concerns.  Patient Instructions  BEFORE YOU LEAVE: -labs -follow up in 4 months  -We have ordered labs or studies at this visit. It can take up to 1-2 weeks for results and processing. We will contact you with instructions IF your results are abnormal. Normal results will be released to your [facility]. If you have not heard from [place] or can not find your results in [facility] in 2 weeks please contact our office.  We recommend the following healthy lifestyle measures: - eat a healthy whole foods diet consisting of regular small meals composed of vegetables, fruits, beans, nuts, seeds, healthy meats such as white chicken and fish and whole grains.  - avoid sweets, white starchy foods, fried foods, fast food, processed foods, sodas, red meet and other fattening foods.  - get a least 150-300 minutes of aerobic exercise per week.   Follow up yearly with your gynecologist for pelvic and breast exams  # provided in brochure for dermatologist - please call to schedule appointment for removal of lesion on the scalp and skin exam         No Follow-up on file.  Colin Benton R.

## 2015-11-04 ENCOUNTER — Encounter: Payer: Self-pay | Admitting: *Deleted

## 2015-11-04 LAB — HIV ANTIBODY (ROUTINE TESTING W REFLEX): HIV 1&2 Ab, 4th Generation: NONREACTIVE

## 2015-11-04 LAB — HEPATITIS C ANTIBODY: HCV Ab: NEGATIVE

## 2015-11-04 NOTE — Addendum Note (Signed)
Addended by: Agnes Lawrence on: 11/04/2015 04:56 PM   Modules accepted: Orders

## 2015-11-14 ENCOUNTER — Ambulatory Visit
Admission: RE | Admit: 2015-11-14 | Discharge: 2015-11-14 | Disposition: A | Discharge status: Home / Self Care | Payer: Medicare Other | Source: Ambulatory Visit | Attending: Anesthesiology | Admitting: Anesthesiology

## 2015-11-14 DIAGNOSIS — M25562 Pain in left knee: Secondary | ICD-10-CM | POA: Diagnosis not present

## 2015-11-19 ENCOUNTER — Other Ambulatory Visit: Payer: Self-pay | Admitting: Family

## 2015-11-20 ENCOUNTER — Other Ambulatory Visit: Payer: Self-pay | Admitting: Family Medicine

## 2015-11-20 ENCOUNTER — Other Ambulatory Visit: Payer: Self-pay | Admitting: Internal Medicine

## 2015-11-23 DIAGNOSIS — J45909 Unspecified asthma, uncomplicated: Secondary | ICD-10-CM | POA: Diagnosis not present

## 2015-11-24 ENCOUNTER — Encounter: Payer: Self-pay | Admitting: Adult Health

## 2015-11-24 ENCOUNTER — Ambulatory Visit (INDEPENDENT_AMBULATORY_CARE_PROVIDER_SITE_OTHER): Payer: Medicare Other | Admitting: Adult Health

## 2015-11-24 VITALS — BP 100/66 | HR 54 | Temp 98.4°F | Ht 66.0 in | Wt 218.0 lb

## 2015-11-24 DIAGNOSIS — J9611 Chronic respiratory failure with hypoxia: Secondary | ICD-10-CM

## 2015-11-24 DIAGNOSIS — J4550 Severe persistent asthma, uncomplicated: Secondary | ICD-10-CM | POA: Diagnosis not present

## 2015-11-24 MED ORDER — MONTELUKAST SODIUM 10 MG PO TABS: 10.0000 mg | ORAL_TABLET | Freq: Every day | ORAL | Status: DC

## 2015-11-24 NOTE — Patient Instructions (Signed)
Continue on Dulera 2 puffs Twice daily   Continue on Singulair daily  Continue on oxygen 2l/m At bedtime   Follow up Dr. Elsworth Soho  In 3 months and As needed   Please contact office for sooner follow up if symptoms do not improve or worsen or seek emergency care

## 2015-11-24 NOTE — Addendum Note (Signed)
Addended by: [name] on: 11/24/2015 04:27 PM   Modules accepted: Orders

## 2015-11-24 NOTE — Progress Notes (Signed)
   Subjective:    Patient ID: Shelby Anderson, female    DOB: [birth year], 59 y.o.   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  She was evaluated for CAD  at [facility] for syncope.& has a loop recorder - brady & int a - fibn   Significant tests/ events  Spirometry 12/2011 - no airway obstruction -No reversibility with bronchodilator , mod restriction Her husband smokes.. She has positive PPD.  CXR LLL nodular infx - resolved 04/2012  Home sleep test 07/2012 - showed desatn, but could not afford O2    Jan 2015 -admitted  asthma exacerbation, community-acquired pneumonia involving the right upper lobe and RSV bronchitis.  EGD in 03/2011 ([facility]) has shown peptic ulcer disease.  12/2013  barium esophagram - distal esophageal stricture.   stricture dilated ([place]) >> Restarted Singulair 04/2015   11/24/2015 Follow up : Asthma never smoker  Pt returns for 3 month follow up .  Says overall her breathing is doing okay . Gets occasional cough on/off. Not bad.  Remains on [place] , Singular .  No chest pain, fever, n/v/d or hemoptysis    ONO positive , Started on oxygen 2l/m .Feels better sleeping now.  Had HST in 2013 that was neg.   Has trouble with knee, may have to knee surgery ortho says.     Review of Systems neg for any significant sore throat, dysphagia, itching, sneezing, nasal congestion or excess/ purulent secretions, fever, chills, sweats, unintended wt loss, pleuritic or exertional cp, hempoptysis, orthopnea pnd or change in chronic leg swelling. Also denies presyncope, palpitations, heartburn, abdominal pain, nausea, vomiting, diarrhea or change in bowel or urinary habits, dysuria,hematuria, rash, arthralgias, visual complaints, headache, numbness weakness or ataxia.     Objective:   Physical  Exam  Gen. Pleasant, obese, in no distress ENT - no lesions, no post nasal drip Neck: No JVD, no thyromegaly, no carotid bruits Lungs: no use of accessory muscles, no dullness to percussion,  No wheezing  Cardiovascular: Rhythm regular, heart sounds  normal, no murmurs or gallops, no peripheral edema Musculoskeletal: No deformities, no cyanosis or clubbing , no tremors   CXR 08/26/15 NAD      Assessment & Plan:

## 2015-11-24 NOTE — Assessment & Plan Note (Signed)
Continue on Dulera 2 puffs Twice daily   Continue on Singulair daily  Continue on oxygen 2l/m At bedtime   Follow up Dr. Elsworth Soho  In 3 months and As needed   Please contact office for sooner follow up if symptoms do not improve or worsen or seek emergency care

## 2015-11-24 NOTE — Assessment & Plan Note (Signed)
Cont on O2 At bedtime   

## 2015-12-06 ENCOUNTER — Encounter: Payer: Self-pay | Admitting: Family Medicine

## 2015-12-06 ENCOUNTER — Ambulatory Visit (INDEPENDENT_AMBULATORY_CARE_PROVIDER_SITE_OTHER): Payer: Medicare Other | Admitting: Family Medicine

## 2015-12-06 VITALS — BP 128/84 | HR 85 | Temp 100.1°F | Ht 66.0 in | Wt 221.7 lb

## 2015-12-06 DIAGNOSIS — I10 Essential (primary) hypertension: Secondary | ICD-10-CM

## 2015-12-06 DIAGNOSIS — E119 Type 2 diabetes mellitus without complications: Secondary | ICD-10-CM

## 2015-12-06 DIAGNOSIS — E785 Hyperlipidemia, unspecified: Secondary | ICD-10-CM

## 2015-12-06 DIAGNOSIS — K219 Gastro-esophageal reflux disease without esophagitis: Secondary | ICD-10-CM

## 2015-12-06 DIAGNOSIS — F330 Major depressive disorder, recurrent, mild: Secondary | ICD-10-CM

## 2015-12-06 DIAGNOSIS — I2510 Atherosclerotic heart disease of native coronary artery without angina pectoris: Secondary | ICD-10-CM | POA: Diagnosis not present

## 2015-12-06 HISTORY — DX: Type 2 diabetes mellitus without complications: E11.9

## 2015-12-06 HISTORY — DX: Major depressive disorder, recurrent, mild: F33.0

## 2015-12-06 NOTE — Progress Notes (Signed)
Pre visit review using our clinic review tool, if applicable. No additional management support is needed unless otherwise documented below in the visit note. 

## 2015-12-06 NOTE — Progress Notes (Signed)
Reviewed & agree with plan  

## 2015-12-06 NOTE — Progress Notes (Signed)
HPI:  Follow up:  New diagnosis of DM: -a recent transfer to me, appears has likely been borderline/mild diabetic for some time - recent hgba1c 6.8 11/2015, LDL at goal, cr 0.63 -denies: polyuria, polydipsia, vision change -scheduled to see diabetic educator next week -diet and exercise: diet ok, no regular exercise but active taking care of grandkids -reports small meals due to her GI issues - sees gastroenterologist for terrible gerd -hx CAD (sees cardiologist), HLD, HTN, orthostattic hypotension - on statin and asa, not on acei - has asthma  MDD: -identified on screening forms at recent preventive visit and she opted to try CBT - has not had a chance to schedule - reports is doing fine/baseline -reports has had her whole life -mildly constant depressed mood and hopeless feelings at times, no SI, panic or manic symptoms   ROS: See pertinent positives and negatives per HPI.  Past Medical History  Diagnosis Date  . Angina   . Heart murmur   . Mental disorder   . Coronary artery disease     Arrythmia, orthostatic hypotension, HLD, HTN; sees Dr. Einar Gip  . Hypertension   . Recurrent upper respiratory infection (URI)   . Tuberculosis     + TB SKIN TEST  . GERD (gastroesophageal reflux disease)     hx hiatal hernia, stricture and gastric ulcer  . Headache(784.0)   . Pneumonia   . Anxiety and depression   . TMJ (dislocation of temporomandibular joint)   . Difficult intubation     "TMJ & woke up when they were still cutting on me"  . PONV (postoperative nausea and vomiting)   . Dysrhythmia     sees Dr. Einar Gip and a cardiologist at [facility]  . Atrial fibrillation ([place])     h/o "AF w/frequent PVCs"  . Myocardial infarction [facility]) 1980's & 1990;    sees Dr. Einar Gip  . Shortness of breath 11/20/11    "all the time", sees pulmonlogy, ? asthma  . Anemia     "chronic"  . Stomach ulcer     "3 small; found in 05/2011"  . History of migraines     "dx'd when I was in my teens"  .  Fibromyalgia     "in my legs"  . Chronic back pain greater than 3 months duration     on chronic narcotics, treated at pain clinic  . Hyperlipemia   . Stenotic cervical os   . Fibroids   . Mild episode of recurrent major depressive disorder ([place]) 12/06/2015  . Type 2 diabetes mellitus without complication, without long-term current use of insulin ([place]) 12/06/2015    Past Surgical History  Procedure Laterality Date  . Carpal tunnel release  unknown    left hand  . Achilles tendon repair  1970's    left ankle  . Arthroscopic repair acl      left knee cap  . Cardiac catheterization      loop recorder  . Post ganglionectomy  1970's    "for migraine headaches"  . Tubal ligation  1980's  . Pouch string  [phone]    "did this 3 times (once w/each pregnancy)"    Family History  Problem Relation Age of Onset  . Malignant hyperthermia Father   . Hypertension Father   . Heart disease Father   . Diabetes Father   . Cancer Father     skin  . Anesthesia problems Neg Hx   . Hypotension Neg Hx   . Pseudochol deficiency Neg  Hx   . Hypertension Mother   . Heart disease Mother   . Cancer Sister     CERVICAL  . Hypertension Sister   . Cancer Brother     MELANOMA  . Heart disease Maternal Grandmother   . Heart disease Maternal Grandfather   . Cancer Paternal Grandmother     ?   Marland Kitchen Heart disease Paternal Grandmother   . Heart disease Paternal Grandfather   . Cancer Brother     LUNG  . Diabetes Sister   . Hypertension Sister   . Heart disease Sister   . Cancer Sister   . Cancer Brother     Social History   Social History  . Marital Status: Married    Spouse Name: N/A  . Number of Children: 3  . Years of Education: N/A   Occupational History  . Retired Therapist, sports    Social History Main Topics  . Smoking status: Never Smoker   . Smokeless tobacco: Never Used  . Alcohol Use: No  . Drug Use: No  . Sexual Activity: Yes    Birth Control/ Protection: Surgical   Other Topics  Concern  . None   Social History Narrative     Current outpatient prescriptions:  .  albuterol (PROVENTIL HFA;VENTOLIN HFA) 108 (90 BASE) MCG/ACT inhaler, Inhale 2 puffs into the lungs every 4 (four) hours as needed. Shortness of breath, Disp: 1 Inhaler, Rfl: 3 .  albuterol (PROVENTIL) (2.5 MG/3ML) 0.083% nebulizer solution, Take 3 mLs (2.5 mg total) by nebulization every 6 (six) hours as needed for wheezing or shortness of breath., Disp: 75 mL, Rfl: 12 .  alclomethasone (ACLOVATE) 0.05 % cream, Apply topically 2 (two) times daily., Disp: 60 g, Rfl: 2 .  aspirin 81 MG EC tablet, Take 81 mg by mouth daily. Swallow whole. , Disp: , Rfl:  .  atorvastatin (LIPITOR) 40 MG tablet, TAKE 1 TABLET (40 MG TOTAL) BY MOUTH DAILY., Disp: 90 tablet, Rfl: 1 .  Calcium Carbonate-Vitamin D (CALCIUM + D PO), Take 1 tablet by mouth 3 (three) times daily.  , Disp: , Rfl:  .  Cinnamon 500 MG capsule, Take 1,000 mg by mouth 2 (two) times daily. , Disp: , Rfl:  .  esomeprazole (NEXIUM) 40 MG capsule, TAKE 1 CAPSULE (40 MG TOTAL) BY MOUTH 2 (TWO) TIMES DAILY., Disp: 60 capsule, Rfl: 0 .  Eszopiclone (ESZOPICLONE) 3 MG TABS, Take 3 mg by mouth at bedtime as needed., Disp: , Rfl:  .  fludrocortisone (FLORINEF) 0.1 MG tablet, TAKE 1 TABLET BY MOUTH TWICE A DAY, Disp: 60 tablet, Rfl: 5 .  gabapentin (NEURONTIN) 400 MG capsule, 1 capsule by mouth twice daily and 2 capsules at bedtime, Disp: , Rfl:  .  GLUCOSAMINE PO, Take 1 tablet by mouth 2 (two) times daily.  , Disp: , Rfl:  .  isosorbide mononitrate (IMDUR) 60 MG 24 hr tablet, Take 1 tablet (60 mg total) by mouth daily., Disp: 90 tablet, Rfl: 1 .  KLOR-CON M20 20 MEQ tablet, TAKE 1 TABLET (20 MEQ TOTAL) BY MOUTH DAILY., Disp: 30 tablet, Rfl: 3 .  L-Methylfolate (DEPLIN) 7.5 MG TABS, Take 7.5 mg by mouth daily.  , Disp: , Rfl:  .  lidocaine (LIDODERM) 5 %, Place 2 patches onto the skin as needed. Remove & Discard patch within 12 hours or as directed by MD, Disp: , Rfl:   .  meloxicam (MOBIC) 7.5 MG tablet, TAKE 1 TABLET (7.5 MG TOTAL) BY MOUTH DAILY AS NEEDED  FOR PAIN., Disp: 30 tablet, Rfl: 3 .  metoprolol (LOPRESSOR) 50 MG tablet, Take 1 tablet (50 mg total) by mouth 2 (two) times daily., Disp: 180 tablet, Rfl: 1 .  mometasone-formoterol (DULERA) 200-5 MCG/ACT AERO, Inhale 2 puffs into the lungs 2 (two) times daily., Disp: 1 Inhaler, Rfl: 4 .  montelukast (SINGULAIR) 10 MG tablet, Take 1 tablet (10 mg total) by mouth at bedtime., Disp: 90 tablet, Rfl: 2 .  Multiple Vitamin (MULITIVITAMIN WITH MINERALS) TABS, Take 1 tablet by mouth daily.  , Disp: , Rfl:  .  NONFORMULARY OR COMPOUNDED ITEM, Estradiol .02% 1 ML Prefilled Applicator Sig: apply vaginally twice a week #90 Day Supply with 4 refills, Disp: 1 each, Rfl: 4 .  Omega-3 Fatty Acids (FISH OIL) 1000 MG CAPS, Take 1,000 mg by mouth 2 (two) times daily., Disp: , Rfl:  .  Oxycodone HCl 20 MG TABS, Take 30 mg by mouth every 4 (four) hours as needed (pain.). , Disp: , Rfl:  .  progesterone (PROMETRIUM) 200 MG capsule, Take one tablet the first 12 days of the month, Disp: 36 capsule, Rfl: 4 .  promethazine (PHENERGAN) 25 MG tablet, TAKE 1 TABLET BY MOUTH AT BEDTIME AS NEEDED FOR NAUSEA AND VOMITING, Disp: 30 tablet, Rfl: 1 .  sucralfate (CARAFATE) 1 GM/10ML suspension, Take 1 g by mouth 4 (four) times daily -  with meals and at bedtime., Disp: , Rfl:  .  [DISCONTINUED] Potassium Chloride ER 20 MEQ TBCR, Take 1 tablet by mouth daily., Disp: 30 tablet, Rfl: 3  EXAM:  Filed Vitals:   12/06/15 1519  BP: 128/84  Pulse: 85  Temp: 100.1 F (37.8 C)    Body mass index is 35.8 kg/(m^2).  GENERAL: vitals reviewed and listed above, alert, oriented, appears well hydrated and in no acute distress  HEENT: atraumatic, conjunttiva clear, no obvious abnormalities on inspection of external nose and ears  NECK: no obvious masses on inspection  LUNGS: clear to auscultation bilaterally, no wheezes, rales or rhonchi, good  air movement  CV: HRRR, no peripheral edema  MS: moves all extremities without noticeable abnormality  PSYCH: pleasant and cooperative, no obvious depression or anxiety  ASSESSMENT AND PLAN:  Discussed the following assessment and plan:  Type 2 diabetes mellitus without complication, without long-term current use of insulin (HCC)  Coronary artery disease involving native coronary artery of native heart without angina pectoris  Essential hypertension  Gastroesophageal reflux disease, esophagitis presence not specified  Hyperlipemia  Mild episode of recurrent major depressive disorder (HCC)  -disease process, complications, treatments, implications of diabetes discussed today -she has opted for lifestyle changes and sees diabetes educator later this week, appears has had mild diabetes for years per review of chart, but she was not aware -again encouraged CBT, she may consider -mildly elevated temp in normal range today noted, she denies feeling sick or any recent illness, advised to monitor and follow up if fevers -Patient advised to return or notify a doctor immediately if symptoms worsen or persist or new concerns arise.  Patient Instructions  BEFORE YOU LEAVE: -schedule follow up in 3 months  Call opthomologist to schedule a diabetic eye exam  FOR YOUR DIABETES:  []  Eat a healthy low carb diet (avoid sweets, sweet drinks, breads, potatoes, rice, etc.) and ensure 3 small meals daily.  []  Get AT LEAST 150 minutes of cardiovascular exercise per week - 30 minutes per day is best of sustained sweaty exercise.  []  Take all of your medications every  day as directed by your doctor. Call your doctor immediately if you have any questions about your medications or are running low.  []  See an eye doctor every year and fax your diabetic eye exam to our office.  Fax: [phone]  []  Take good care of your feet and keep them soft and callus free. Check your feet daily and wear  comfortable shoes. See your doctor immediately if you have any cuts, calluses or wounds on your feet.      Colin Benton R.

## 2015-12-06 NOTE — Patient Instructions (Signed)
BEFORE YOU LEAVE: -schedule follow up in 3 months  Call opthomologist to schedule a diabetic eye exam  FOR YOUR DIABETES:  []  Eat a healthy low carb diet (avoid sweets, sweet drinks, breads, potatoes, rice, etc.) and ensure 3 small meals daily.  []  Get AT LEAST 150 minutes of cardiovascular exercise per week - 30 minutes per day is best of sustained sweaty exercise.  []  Take all of your medications every day as directed by your doctor. Call your doctor immediately if you have any questions about your medications or are running low.  []  See an eye doctor every year and fax your diabetic eye exam to our office.  Fax: [phone]  []  Take good care of your feet and keep them soft and callus free. Check your feet daily and wear comfortable shoes. See your doctor immediately if you have any cuts, calluses or wounds on your feet.

## 2015-12-16 ENCOUNTER — Ambulatory Visit: Payer: Medicare Other | Admitting: Dietician

## 2015-12-22 ENCOUNTER — Other Ambulatory Visit: Payer: Self-pay | Admitting: Family Medicine

## 2015-12-22 ENCOUNTER — Other Ambulatory Visit: Payer: Self-pay | Admitting: Internal Medicine

## 2015-12-24 DIAGNOSIS — J45909 Unspecified asthma, uncomplicated: Secondary | ICD-10-CM | POA: Diagnosis not present

## 2015-12-26 DIAGNOSIS — M47816 Spondylosis without myelopathy or radiculopathy, lumbar region: Secondary | ICD-10-CM | POA: Diagnosis not present

## 2015-12-26 DIAGNOSIS — G894 Chronic pain syndrome: Secondary | ICD-10-CM | POA: Diagnosis not present

## 2015-12-26 DIAGNOSIS — Z79891 Long term (current) use of opiate analgesic: Secondary | ICD-10-CM | POA: Diagnosis not present

## 2015-12-26 DIAGNOSIS — M4726 Other spondylosis with radiculopathy, lumbar region: Secondary | ICD-10-CM | POA: Diagnosis not present

## 2015-12-28 ENCOUNTER — Encounter: Payer: Self-pay | Admitting: Family Medicine

## 2015-12-28 ENCOUNTER — Ambulatory Visit (INDEPENDENT_AMBULATORY_CARE_PROVIDER_SITE_OTHER): Payer: Medicare Other | Admitting: Family Medicine

## 2015-12-28 ENCOUNTER — Telehealth: Payer: Self-pay | Admitting: Family Medicine

## 2015-12-28 VITALS — BP 112/74 | HR 71 | Temp 98.5°F | Ht 66.0 in | Wt 217.9 lb

## 2015-12-28 DIAGNOSIS — H66011 Acute suppurative otitis media with spontaneous rupture of ear drum, right ear: Secondary | ICD-10-CM

## 2015-12-28 MED ORDER — OFLOXACIN 0.3 % OT SOLN: OTIC | Status: DC

## 2015-12-28 MED ORDER — AZITHROMYCIN 250 MG PO TABS: ORAL_TABLET | ORAL | Status: DC

## 2015-12-28 MED ORDER — CEFDINIR 300 MG PO CAPS: 300.0000 mg | ORAL_CAPSULE | Freq: Two times a day (BID) | ORAL | Status: DC

## 2015-12-28 MED ORDER — CIPROFLOXACIN-HYDROCORTISONE 0.2-1 % OT SUSP: 3.0000 [drp] | Freq: Two times a day (BID) | OTIC | Status: DC

## 2015-12-28 NOTE — Telephone Encounter (Signed)
I don't know if Floxin otic- 10 gtts into affected ear bid for 10 day would be less expensive but we could try.

## 2015-12-28 NOTE — Telephone Encounter (Signed)
Pt call to say the following med is to expensive and is asking if something cheaper can be called in    ciprofloxacin-hydrocortisone (CIPRO HC) otic suspension   [facility] [address]

## 2015-12-28 NOTE — Progress Notes (Signed)
Pre visit review using our clinic review tool, if applicable. No additional management support is needed unless otherwise documented below in the visit note. 

## 2015-12-28 NOTE — Telephone Encounter (Signed)
Dr Burchette-could you help with this? Dr [name] is out of the office until Monday.

## 2015-12-28 NOTE — Progress Notes (Addendum)
HPI:  Acute visit for ear pain: -started: 4 days ago -symptoms: L ear pain, then blood then pus draining from ear, decreased hearing, feels somewhat better after presumed rupture - hx ETD -denies:fever, SOB, NVD, tooth pain, sinus pain -report penicillin allergy occurred when under anesthesia for surgery and was told never to take again but she is not sure of type of reaction  ROS: See pertinent positives and negatives per HPI.  Past Medical History  Diagnosis Date  . Angina   . Heart murmur   . Mental disorder   . Coronary artery disease     Arrythmia, orthostatic hypotension, HLD, HTN; sees Dr. Einar Gip  . Hypertension   . Recurrent upper respiratory infection (URI)   . Tuberculosis     + TB SKIN TEST  . GERD (gastroesophageal reflux disease)     hx hiatal hernia, stricture and gastric ulcer  . Headache(784.0)   . Pneumonia   . Anxiety and depression   . TMJ (dislocation of temporomandibular joint)   . Difficult intubation     "TMJ & woke up when they were still cutting on me"  . PONV (postoperative nausea and vomiting)   . Dysrhythmia     sees Dr. Einar Gip and a cardiologist at [facility]  . Atrial fibrillation ([place])     h/o "AF w/frequent PVCs"  . Myocardial infarction [facility]) 1980's & 1990;    sees Dr. Einar Gip  . Shortness of breath 11/20/11    "all the time", sees pulmonlogy, ? asthma  . Anemia     "chronic"  . Stomach ulcer     "3 small; found in 05/2011"  . History of migraines     "dx'd when I was in my teens"  . Fibromyalgia     "in my legs"  . Chronic back pain greater than 3 months duration     on chronic narcotics, treated at pain clinic  . Hyperlipemia   . Stenotic cervical os   . Fibroids   . Mild episode of recurrent major depressive disorder ([place]) 12/06/2015  . Type 2 diabetes mellitus without complication, without long-term current use of insulin ([place]) 12/06/2015    Past Surgical History  Procedure Laterality Date  . Carpal tunnel release  unknown     left hand  . Achilles tendon repair  1970's    left ankle  . Arthroscopic repair acl      left knee cap  . Cardiac catheterization      loop recorder  . Post ganglionectomy  1970's    "for migraine headaches"  . Tubal ligation  1980's  . Pouch string  [phone]    "did this 3 times (once w/each pregnancy)"    Family History  Problem Relation Age of Onset  . Malignant hyperthermia Father   . Hypertension Father   . Heart disease Father   . Diabetes Father   . Cancer Father     skin  . Anesthesia problems Neg Hx   . Hypotension Neg Hx   . Pseudochol deficiency Neg Hx   . Hypertension Mother   . Heart disease Mother   . Cancer Sister     CERVICAL  . Hypertension Sister   . Cancer Brother     MELANOMA  . Heart disease Maternal Grandmother   . Heart disease Maternal Grandfather   . Cancer Paternal Grandmother     ?   Marland Kitchen Heart disease Paternal Grandmother   . Heart disease Paternal Grandfather   .  Cancer Brother     LUNG  . Diabetes Sister   . Hypertension Sister   . Heart disease Sister   . Cancer Sister   . Cancer Brother     Social History   Social History  . Marital Status: Married    Spouse Name: N/A  . Number of Children: 3  . Years of Education: N/A   Occupational History  . Retired Therapist, sports    Social History Main Topics  . Smoking status: Never Smoker   . Smokeless tobacco: Never Used  . Alcohol Use: No  . Drug Use: No  . Sexual Activity: Yes    Birth Control/ Protection: Surgical   Other Topics Concern  . None   Social History Narrative     Current outpatient prescriptions:  .  albuterol (PROVENTIL HFA;VENTOLIN HFA) 108 (90 BASE) MCG/ACT inhaler, Inhale 2 puffs into the lungs every 4 (four) hours as needed. Shortness of breath, Disp: 1 Inhaler, Rfl: 3 .  albuterol (PROVENTIL) (2.5 MG/3ML) 0.083% nebulizer solution, Take 3 mLs (2.5 mg total) by nebulization every 6 (six) hours as needed for wheezing or shortness of breath., Disp: 75 mL, Rfl: 12 .   alclomethasone (ACLOVATE) 0.05 % cream, Apply topically 2 (two) times daily., Disp: 60 g, Rfl: 2 .  aspirin 81 MG EC tablet, Take 81 mg by mouth daily. Swallow whole. , Disp: , Rfl:  .  atorvastatin (LIPITOR) 40 MG tablet, TAKE 1 TABLET (40 MG TOTAL) BY MOUTH DAILY., Disp: 90 tablet, Rfl: 1 .  Calcium Carbonate-Vitamin D (CALCIUM + D PO), Take 1 tablet by mouth 3 (three) times daily.  , Disp: , Rfl:  .  Cinnamon 500 MG capsule, Take 1,000 mg by mouth 2 (two) times daily. , Disp: , Rfl:  .  esomeprazole (NEXIUM) 40 MG capsule, TAKE 1 CAPSULE (40 MG TOTAL) BY MOUTH 2 (TWO) TIMES DAILY., Disp: 60 capsule, Rfl: 0 .  Eszopiclone (ESZOPICLONE) 3 MG TABS, Take 3 mg by mouth at bedtime as needed., Disp: , Rfl:  .  fludrocortisone (FLORINEF) 0.1 MG tablet, TAKE 1 TABLET BY MOUTH TWICE A DAY, Disp: 60 tablet, Rfl: 5 .  gabapentin (NEURONTIN) 400 MG capsule, 1 capsule by mouth twice daily and 2 capsules at bedtime, Disp: , Rfl:  .  GLUCOSAMINE PO, Take 1 tablet by mouth 2 (two) times daily.  , Disp: , Rfl:  .  isosorbide mononitrate (IMDUR) 60 MG 24 hr tablet, Take 1 tablet (60 mg total) by mouth daily., Disp: 90 tablet, Rfl: 1 .  KLOR-CON M20 20 MEQ tablet, TAKE 1 TABLET (20 MEQ TOTAL) BY MOUTH DAILY., Disp: 30 tablet, Rfl: 3 .  L-Methylfolate (DEPLIN) 7.5 MG TABS, Take 7.5 mg by mouth daily.  , Disp: , Rfl:  .  lidocaine (LIDODERM) 5 %, Place 2 patches onto the skin as needed. Remove & Discard patch within 12 hours or as directed by MD, Disp: , Rfl:  .  meloxicam (MOBIC) 7.5 MG tablet, TAKE 1 TABLET (7.5 MG TOTAL) BY MOUTH DAILY AS NEEDED FOR PAIN., Disp: 30 tablet, Rfl: 3 .  metoprolol (LOPRESSOR) 50 MG tablet, Take 1 tablet (50 mg total) by mouth 2 (two) times daily., Disp: 180 tablet, Rfl: 1 .  mometasone-formoterol (DULERA) 200-5 MCG/ACT AERO, Inhale 2 puffs into the lungs 2 (two) times daily., Disp: 1 Inhaler, Rfl: 4 .  montelukast (SINGULAIR) 10 MG tablet, Take 1 tablet (10 mg total) by mouth at  bedtime., Disp: 90 tablet, Rfl: 2 .  Multiple Vitamin (MULITIVITAMIN WITH MINERALS) TABS, Take 1 tablet by mouth daily.  , Disp: , Rfl:  .  NONFORMULARY OR COMPOUNDED ITEM, Estradiol .02% 1 ML Prefilled Applicator Sig: apply vaginally twice a week #90 Day Supply with 4 refills, Disp: 1 each, Rfl: 4 .  Omega-3 Fatty Acids (FISH OIL) 1000 MG CAPS, Take 1,000 mg by mouth 2 (two) times daily., Disp: , Rfl:  .  Oxycodone HCl 20 MG TABS, Take 30 mg by mouth every 4 (four) hours as needed (pain.). , Disp: , Rfl:  .  progesterone (PROMETRIUM) 200 MG capsule, Take one tablet the first 12 days of the month, Disp: 36 capsule, Rfl: 4 .  promethazine (PHENERGAN) 25 MG tablet, TAKE 1 TABLET BY MOUTH AT BEDTIME AS NEEDED FOR NAUSEA AND VOMITING, Disp: 30 tablet, Rfl: 1 .  sucralfate (CARAFATE) 1 GM/10ML suspension, Take 1 g by mouth 4 (four) times daily -  with meals and at bedtime., Disp: , Rfl:  .  azithromycin (ZITHROMAX) 250 MG tablet, 2 tabs day 1, then one tab daily, Disp: 6 tablet, Rfl: 0 .  ciprofloxacin-hydrocortisone (CIPRO HC) otic suspension, Place 3 drops into the right ear 2 (two) times daily. For 5 days, Disp: 10 mL, Rfl: 0 .  [DISCONTINUED] Potassium Chloride ER 20 MEQ TBCR, Take 1 tablet by mouth daily., Disp: 30 tablet, Rfl: 3  EXAM:  Filed Vitals:   12/28/15 1134  BP: 112/74  Pulse: 71  Temp: 98.5 F (36.9 C)    Body mass index is 35.19 kg/(m^2).  GENERAL: vitals reviewed and listed above, alert, oriented, appears well hydrated and in no acute distress  HEENT: atraumatic, conjunttiva clear, no obvious abnormalities on inspection of external nose and ears, normal appearance of ear canals and TMs except for purulent drainage in R ear canal and erythematous TM on portion visualize, no blood, no mastoid TTP and clear effusion L ear, clear nasal congestion, mild post oropharyngeal erythema with PND, no tonsillar edema or exudate, no sinus TTP  NECK: no obvious masses on  inspection  LUNGS: clear to auscultation bilaterally, no wheezes, rales or rhonchi, good air movement  CV: HRRR, no peripheral edema  MS: moves all extremities without noticeable abnormality  PSYCH: pleasant and cooperative, no obvious depression or anxiety  ASSESSMENT AND PLAN:  Discussed the following assessment and plan:  Acute suppurative otitis media of right ear with spontaneous rupture of tympanic membrane, recurrence not specified  Using a soft currette purulent material and some wax was gently removed from the canal. There was a small amount of hard wax remaining of the eardrum which obstructed complete visualization but I do strongly suspect AOM with spontaneous rupture and do not see signs of OE or significant mastoiditis.  We discussed treatment side effects, likely course, antibiotic misuse, transmission, and signs of developing a serious illness. Given the unclear nature and possible severe prior reaction to penicillin opted of oral azithromycin and ciprodex with close follow up for re-eval and emergency precautions. -of course, we advised to return or notify a doctor immediately if symptoms worsen or persist or new concerns arise.    Patient Instructions  Use the oral antibiotic and the drops as instructed.   Follow up in 1 week and sooner if worsening or new concerns     Shelby Anderson R.

## 2015-12-28 NOTE — Addendum Note (Signed)
Addended by: Lucretia Kern on: 12/28/2015 12:26 PM   Modules accepted: Orders, Medications

## 2015-12-28 NOTE — Telephone Encounter (Signed)
Rx sent and patient is aware.  She should call back if too expensive.

## 2015-12-28 NOTE — Patient Instructions (Signed)
Use the oral antibiotic and the drops as instructed.   Follow up in 1 week and sooner if worsening or new concerns

## 2016-01-03 DIAGNOSIS — M179 Osteoarthritis of knee, unspecified: Secondary | ICD-10-CM | POA: Diagnosis not present

## 2016-01-04 ENCOUNTER — Ambulatory Visit (INDEPENDENT_AMBULATORY_CARE_PROVIDER_SITE_OTHER): Payer: Medicare Other | Admitting: Family Medicine

## 2016-01-04 DIAGNOSIS — R69 Illness, unspecified: Secondary | ICD-10-CM

## 2016-01-04 NOTE — Progress Notes (Signed)
No show

## 2016-01-05 ENCOUNTER — Encounter: Payer: Self-pay | Admitting: Dietician

## 2016-01-05 ENCOUNTER — Encounter: Payer: Medicare Other | Attending: Endocrinology | Admitting: Dietician

## 2016-01-05 ENCOUNTER — Encounter: Payer: Self-pay | Admitting: Family Medicine

## 2016-01-05 ENCOUNTER — Ambulatory Visit (INDEPENDENT_AMBULATORY_CARE_PROVIDER_SITE_OTHER): Payer: Medicare Other | Admitting: Family Medicine

## 2016-01-05 VITALS — Ht 66.0 in | Wt 215.0 lb

## 2016-01-05 VITALS — BP 100/70 | HR 60 | Temp 98.0°F | Ht 66.0 in | Wt 215.5 lb

## 2016-01-05 DIAGNOSIS — H66011 Acute suppurative otitis media with spontaneous rupture of ear drum, right ear: Secondary | ICD-10-CM

## 2016-01-05 DIAGNOSIS — E119 Type 2 diabetes mellitus without complications: Secondary | ICD-10-CM

## 2016-01-05 DIAGNOSIS — H6091 Unspecified otitis externa, right ear: Secondary | ICD-10-CM

## 2016-01-05 DIAGNOSIS — R739 Hyperglycemia, unspecified: Secondary | ICD-10-CM | POA: Insufficient documentation

## 2016-01-05 MED ORDER — LEVOFLOXACIN 500 MG PO TABS: 500.0000 mg | ORAL_TABLET | Freq: Every day | ORAL | Status: DC

## 2016-01-05 NOTE — Progress Notes (Signed)
HPI:  Follow up R AOM with perforation: -she no showed appt yesterday -treated with oral and topic abx -reports: reports has been taking both antibiotics, perhaps a little better in terms of pain in ear, but persistent significant pain, pus from ear, decreased hearing, black discharge from ear? and now with pain anterior to ear and in R cheek -denies:fevers, malaise  ROS: See pertinent positives and negatives per HPI.  Past Medical History  Diagnosis Date  . Angina   . Heart murmur   . Mental disorder   . Coronary artery disease     Arrythmia, orthostatic hypotension, HLD, HTN; sees Dr. Einar Gip  . Hypertension   . Recurrent upper respiratory infection (URI)   . Tuberculosis     + TB SKIN TEST  . GERD (gastroesophageal reflux disease)     hx hiatal hernia, stricture and gastric ulcer  . Headache(784.0)   . Pneumonia   . Anxiety and depression   . TMJ (dislocation of temporomandibular joint)   . Difficult intubation     "TMJ & woke up when they were still cutting on me"  . PONV (postoperative nausea and vomiting)   . Dysrhythmia     sees Dr. Einar Gip and a [facility]ologist at [facility]  . Atrial fibrillation ([place])     h/o "AF w/frequent PVCs"  . Myocardial infarction [facility]) 1980's & 1990;    sees Dr. Einar Gip  . Shortness of breath 11/20/11    "all the time", sees pulmonlogy, ? asthma  . Anemia     "chronic"  . Stomach ulcer     "3 small; found in 05/2011"  . History of migraines     "dx'd when I was in my teens"  . Fibromyalgia     "in my legs"  . Chronic back pain greater than 3 months duration     on chronic narcotics, treated at pain clinic  . Hyperlipemia   . Stenotic cervical os   . Fibroids   . Mild episode of recurrent major depressive disorder ([place]) 12/06/2015  . Type 2 diabetes mellitus without complication, without long-term current use of insulin ([place]) 12/06/2015    Past Surgical History  Procedure Laterality Date  . Carpal tunnel release  unknown    left hand   . Achilles tendon repair  1970's    left ankle  . Arthroscopic repair acl      left knee cap  . Cardiac catheterization      loop recorder  . Post ganglionectomy  1970's    "for migraine headaches"  . Tubal ligation  1980's  . Pouch string  [phone]    "did this 3 times (once w/each pregnancy)"    Family History  Problem Relation Age of Onset  . Malignant hyperthermia Father   . Hypertension Father   . Heart disease Father   . Diabetes Father   . Cancer Father     skin  . Anesthesia problems Neg Hx   . Hypotension Neg Hx   . Pseudochol deficiency Neg Hx   . Hypertension Mother   . Heart disease Mother   . Cancer Sister     CERVICAL  . Hypertension Sister   . Cancer Brother     MELANOMA  . Heart disease Maternal Grandmother   . Heart disease Maternal Grandfather   . Cancer Paternal Grandmother     ?   Marland Kitchen Heart disease Paternal Grandmother   . Heart disease Paternal Grandfather   . Cancer Brother  LUNG  . Diabetes Sister   . Hypertension Sister   . Heart disease Sister   . Cancer Sister   . Cancer Brother     Social History   Social History  . Marital Status: Married    Spouse Name: N/A  . Number of Children: 3  . Years of Education: N/A   Occupational History  . Retired Therapist, sports    Social History Main Topics  . Smoking status: Never Smoker   . Smokeless tobacco: Never Used  . Alcohol Use: No  . Drug Use: No  . Sexual Activity: Yes    Birth Control/ Protection: Surgical   Other Topics Concern  . None   Social History Narrative     Current outpatient prescriptions:  .  albuterol (PROVENTIL HFA;VENTOLIN HFA) 108 (90 BASE) MCG/ACT inhaler, Inhale 2 puffs into the lungs every 4 (four) hours as needed. Shortness of breath, Disp: 1 Inhaler, Rfl: 3 .  albuterol (PROVENTIL) (2.5 MG/3ML) 0.083% nebulizer solution, Take 3 mLs (2.5 mg total) by nebulization every 6 (six) hours as needed for wheezing or shortness of breath., Disp: 75 mL, Rfl: 12 .   alclomethasone (ACLOVATE) 0.05 % cream, Apply topically 2 (two) times daily., Disp: 60 g, Rfl: 2 .  aspirin 81 MG EC tablet, Take 81 mg by mouth daily. Swallow whole. , Disp: , Rfl:  .  atorvastatin (LIPITOR) 40 MG tablet, TAKE 1 TABLET (40 MG TOTAL) BY MOUTH DAILY., Disp: 90 tablet, Rfl: 1 .  Calcium Carbonate-Vitamin D (CALCIUM + D PO), Take 1 tablet by mouth 3 (three) times daily.  , Disp: , Rfl:  .  Cinnamon 500 MG capsule, Take 1,000 mg by mouth 2 (two) times daily. , Disp: , Rfl:  .  esomeprazole (NEXIUM) 40 MG capsule, TAKE 1 CAPSULE (40 MG TOTAL) BY MOUTH 2 (TWO) TIMES DAILY., Disp: 60 capsule, Rfl: 0 .  Eszopiclone (ESZOPICLONE) 3 MG TABS, Take 3 mg by mouth at bedtime as needed., Disp: , Rfl:  .  fludrocortisone (FLORINEF) 0.1 MG tablet, TAKE 1 TABLET BY MOUTH TWICE A DAY, Disp: 60 tablet, Rfl: 5 .  gabapentin (NEURONTIN) 400 MG capsule, 1 capsule by mouth twice daily and 2 capsules at bedtime, Disp: , Rfl:  .  GLUCOSAMINE PO, Take 1 tablet by mouth 2 (two) times daily.  , Disp: , Rfl:  .  isosorbide mononitrate (IMDUR) 60 MG 24 hr tablet, Take 1 tablet (60 mg total) by mouth daily., Disp: 90 tablet, Rfl: 1 .  KLOR-CON M20 20 MEQ tablet, TAKE 1 TABLET (20 MEQ TOTAL) BY MOUTH DAILY., Disp: 30 tablet, Rfl: 3 .  L-Methylfolate (DEPLIN) 7.5 MG TABS, Take 7.5 mg by mouth daily.  , Disp: , Rfl:  .  lidocaine (LIDODERM) 5 %, Place 2 patches onto the skin as needed. Remove & Discard patch within 12 hours or as directed by MD, Disp: , Rfl:  .  meloxicam (MOBIC) 7.5 MG tablet, TAKE 1 TABLET (7.5 MG TOTAL) BY MOUTH DAILY AS NEEDED FOR PAIN., Disp: 30 tablet, Rfl: 3 .  metoprolol (LOPRESSOR) 50 MG tablet, Take 1 tablet (50 mg total) by mouth 2 (two) times daily., Disp: 180 tablet, Rfl: 1 .  mometasone-formoterol (DULERA) 200-5 MCG/ACT AERO, Inhale 2 puffs into the lungs 2 (two) times daily., Disp: 1 Inhaler, Rfl: 4 .  montelukast (SINGULAIR) 10 MG tablet, Take 1 tablet (10 mg total) by mouth at  bedtime., Disp: 90 tablet, Rfl: 2 .  Multiple Vitamin (MULITIVITAMIN WITH MINERALS) TABS,  Take 1 tablet by mouth daily.  , Disp: , Rfl:  .  NONFORMULARY OR COMPOUNDED ITEM, Estradiol .02% 1 ML Prefilled Applicator Sig: apply vaginally twice a week #90 Day Supply with 4 refills, Disp: 1 each, Rfl: 4 .  ofloxacin (FLOXIN) 0.3 % otic solution, 1 drop into affected ear twice daily, Disp: 10 mL, Rfl: 0 .  Omega-3 Fatty Acids (FISH OIL) 1000 MG CAPS, Take 1,000 mg by mouth 2 (two) times daily., Disp: , Rfl:  .  Oxycodone HCl 20 MG TABS, Take 30 mg by mouth every 4 (four) hours as needed (pain.). , Disp: , Rfl:  .  progesterone (PROMETRIUM) 200 MG capsule, Take one tablet the first 12 days of the month, Disp: 36 capsule, Rfl: 4 .  promethazine (PHENERGAN) 25 MG tablet, TAKE 1 TABLET BY MOUTH AT BEDTIME AS NEEDED FOR NAUSEA AND VOMITING, Disp: 30 tablet, Rfl: 1 .  sucralfate (CARAFATE) 1 GM/10ML suspension, Take 1 g by mouth 4 (four) times daily -  with meals and at bedtime., Disp: , Rfl:  .  [DISCONTINUED] Potassium Chloride ER 20 MEQ TBCR, Take 1 tablet by mouth daily., Disp: 30 tablet, Rfl: 3  EXAM:  Filed Vitals:   01/05/16 1047  BP: 100/70  Pulse: 60  Temp: 98 F (36.7 C)    Body mass index is 34.8 kg/(m^2).  GENERAL: vitals reviewed and listed above, alert, oriented, appears well hydrated and in no acute distress  HEENT: atraumatic, conjunttiva clear, no obvious abnormalities on inspection of external nose and ears, pus-like appearing material and debris deep in ear canal obstructing visualization of R TM, pain with tugging on tragus, no TMJ joint ttp or crepitus, clear nasal congestion  NECK: no obvious masses on inspection  MS: moves all extremities without noticeable abnormality  PSYCH: pleasant and cooperative, no obvious depression or anxiety  ASSESSMENT AND PLAN:  Discussed the following assessment and plan:  Acute suppurative otitis media of right ear with spontaneous  rupture of tympanic membrane, recurrence not specified - Plan: Ambulatory referral to ENT  Otitis externa, right - Plan: Ambulatory referral to ENT  -optef for urgent ENT eval given persistent symptoms despite treatment and need debridement of canal for good visualization of canal and TM and other structures for further treatment -in interim will treat with levaquin given her many antibiotic intolerances - discussed risks -asked referral coordinator to provide pt with appt information prior to leaving -Patient advised to return or notify a doctor immediately if symptoms worsen or persist or new concerns arise.  There are no Patient Instructions on file for this visit.   Colin Benton R.

## 2016-01-05 NOTE — Progress Notes (Signed)
Medical Nutrition Therapy:  Appt start time: 1500 end time:  1630.   Assessment:  Primary concerns today: Patient is here alone.  She is here due to hyperglycemia.  HgbA1C 6.8% 11/2015 and in 2012.  She has never had education regarding this in the past.  Other hx includes severe GERD.  She is currently out of Nexium for the past month and has an appointment with the GI doctor next week.   She sleeps propped on 4 pillows to avoid reflux.  Other hx includes HTN and hyperlipidemia.    Weight hx.   Highest 250 lbs Usual:  235-250 lbs Today:  215 lbs.  Down 20 lbs in the past 1 month due to stomach problems.  Patient's 58 yo and 61 yo granddaughters live with her.  She is separated from her husband but he does assist in their care.  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  She scored a 22 on the depression screen today.  Some of the problems are associated with her stomach pain and nausea off Nexeum but would benefit from counseling due to her social situation.  She is receptive to this.  Medications cost $600-$900 per month and she has difficulty affording medications and food at times.  She goes into the donut hole in March/April each year.  She would like to reduce her medication intake.      Preferred Learning Style:   No preference indicated   Learning Readiness:   Ready  Contimplating  MEDICATIONS: see list   DIETARY INTAKE:  Usual eating pattern includes 2 meals and 1 snacks per day. Everyday foods include crackers, chips.  Avoided foods include red meat.    24-hr recall:  B ( AM): instant oatmeal, Belvita, orange/peanut butter NABS or skips  Snk ( AM): Belvita or Nabs if she has not had earlier  L ( PM): 1/2 livermush  Or peanut butter and banana sandwich Snk ( PM): none D ( PM): 2 oz chicken, dried beans or peas, mashed potatoes Snk ( PM): none Beverages:  water, 1/2 cup regular gingerale daily to take meds, occasional juice.  Usual physical activity: ADL's.  Complains of knee pain with increased exercise.    Estimated energy needs: 1200 calories 135 g carbohydrates 75 g protein 40 g fat  Progress Towards Goal(s):  In progress.   Nutritional Diagnosis:  NB-1.1 Food and nutrition-related knowledge deficit As related to balance of carbohydrate, protein, and fat.  As evidenced by patient report and diet hx.    Intervention:  Nutrition counseling and diabetes education initiated. Discussed Carb Counting by food group as method of portion control, reading food labels, and benefits of increased activity. Also discussed basic physiology of Diabetes, target BG ranges pre and post meals, and A1c. Also discussed basics of nutritional guidelines for GERD.    Card provided for emergency psychiatric problems as well as list of counseling practices that take her insurance.  Patient is receptive.    Teaching Method Utilized:  Visual Auditory Hands on  Handouts given during visit include: Living Well with Diabetes Carb Counting and Food Label handouts Meal Plan Card Food Pantry resource Counseling list Snack list Label reading  Barriers to learning/adherence to lifestyle change: depression, stress, income, health problems  Demonstrated degree of understanding via:  Teach Back   Monitoring/Evaluation:  Dietary intake, exercise, label  reading, and body weight prn.

## 2016-01-05 NOTE — Patient Instructions (Signed)
See the Ear, Nose and Throat specialist as planned  New antibiotic sent to pharmacy to start in interim  Seek care immediately if worsening, fevers or other concerns

## 2016-01-05 NOTE — Patient Instructions (Signed)
Consider Counseling (see the resource list) Consider [employer] for lower cost groceries Start an exercise habit.  Start slow and increase gradually. Be sure to have breakfast. Small meals Small amount of protein with each meal/snack. Avoid eating 3 hours prior to laying down.

## 2016-01-05 NOTE — Progress Notes (Signed)
Pre visit review using our clinic review tool, if applicable. No additional management support is needed unless otherwise documented below in the visit note. 

## 2016-01-06 DIAGNOSIS — G8929 Other chronic pain: Secondary | ICD-10-CM | POA: Diagnosis not present

## 2016-01-06 DIAGNOSIS — H60333 Swimmer's ear, bilateral: Secondary | ICD-10-CM | POA: Diagnosis not present

## 2016-01-06 DIAGNOSIS — E7800 Pure hypercholesterolemia, unspecified: Secondary | ICD-10-CM | POA: Diagnosis not present

## 2016-01-06 DIAGNOSIS — J310 Chronic rhinitis: Secondary | ICD-10-CM | POA: Diagnosis not present

## 2016-01-06 DIAGNOSIS — R079 Chest pain, unspecified: Secondary | ICD-10-CM | POA: Diagnosis not present

## 2016-01-06 DIAGNOSIS — I10 Essential (primary) hypertension: Secondary | ICD-10-CM | POA: Diagnosis not present

## 2016-01-06 DIAGNOSIS — H6123 Impacted cerumen, bilateral: Secondary | ICD-10-CM | POA: Diagnosis not present

## 2016-01-13 ENCOUNTER — Encounter: Payer: Self-pay | Admitting: Internal Medicine

## 2016-01-13 ENCOUNTER — Ambulatory Visit (INDEPENDENT_AMBULATORY_CARE_PROVIDER_SITE_OTHER): Payer: Medicare Other | Admitting: Internal Medicine

## 2016-01-13 VITALS — BP 100/70 | HR 60 | Ht 66.0 in | Wt 220.0 lb

## 2016-01-13 DIAGNOSIS — K219 Gastro-esophageal reflux disease without esophagitis: Secondary | ICD-10-CM

## 2016-01-13 DIAGNOSIS — K222 Esophageal obstruction: Secondary | ICD-10-CM

## 2016-01-13 DIAGNOSIS — R140 Abdominal distension (gaseous): Secondary | ICD-10-CM | POA: Diagnosis not present

## 2016-01-13 DIAGNOSIS — R1310 Dysphagia, unspecified: Secondary | ICD-10-CM

## 2016-01-13 MED ORDER — SUCRALFATE 1 GM/10ML PO SUSP: 1.0000 g | Freq: Three times a day (TID) | ORAL | Status: DC

## 2016-01-13 MED ORDER — ESOMEPRAZOLE MAGNESIUM 40 MG PO CPDR: DELAYED_RELEASE_CAPSULE | ORAL | Status: DC

## 2016-01-13 NOTE — Progress Notes (Signed)
HISTORY OF PRESENT ILLNESS:  Shelby Anderson is a 60 y.o. female with multiple medical problems as listed below who was last evaluated March 2015 regarding chronic GERD and dysphagia. She was having dysphagia to liquids and solids. Upper endoscopy was performed 03/30/2014. She was found to have a distal esophageal stricture but otherwise normal exam. The esophagus was dilated with a 54 [place] Maloney dilator. The patient was to continue on Nexium and reflux precautions. She states that she ran out of her medications several months ago including Nexium, Phenergan, and Carafate. Since that time she has had deterioration of her reflux disease with significant pyrosis and regurgitation. She also complains of nausea and bloating. She does request refill of her medications. She remains obese but has lost some weight. Prior colonoscopy in 2012 with Dr. Cristina Gong revealed hyperplastic polyp only. Patient does report that her solid food dysphagia was improved post dilation. However she continues with intermittent liquid dysphagia without change in frequency or severity. GI review of systems is otherwise negative.  REVIEW OF SYSTEMS:  All non-GI ROS negative except for night sweats, depression, fatigue, shortness of breath, insomnia,  Past Medical History  Diagnosis Date  . Angina   . Heart murmur   . Mental disorder   . Coronary artery disease     Arrythmia, orthostatic hypotension, HLD, HTN; sees Dr. Einar Gip  . Hypertension   . Recurrent upper respiratory infection (URI)   . Tuberculosis     + TB SKIN TEST  . GERD (gastroesophageal reflux disease)     hx hiatal hernia, stricture and gastric ulcer  . Headache(784.0)   . Pneumonia   . Anxiety and depression   . TMJ (dislocation of temporomandibular joint)   . Difficult intubation     "TMJ & woke up when they were still cutting on me"  . PONV (postoperative nausea and vomiting)   . Dysrhythmia     sees Dr. Einar Gip and a [facility]ologist at [facility]   . Atrial fibrillation ([place])     h/o "AF w/frequent PVCs"  . Myocardial infarction [facility]) 1980's & 1990;    sees Dr. Einar Gip  . Shortness of breath 11/20/11    "all the time", sees pulmonlogy, ? asthma  . Anemia     "chronic"  . Stomach ulcer     "3 small; found in 05/2011"  . History of migraines     "dx'd when I was in my teens"  . Fibromyalgia     "in my legs"  . Chronic back pain greater than 3 months duration     on chronic narcotics, treated at pain clinic  . Hyperlipemia   . Stenotic cervical os   . Fibroids   . Mild episode of recurrent major depressive disorder ([place]) 12/06/2015  . Type 2 diabetes mellitus without complication, without long-term current use of insulin ([place]) 12/06/2015  . Esophageal stricture   . Hiatal hernia   . Fatty liver   . Colon polyps     hyperplastic    Past Surgical History  Procedure Laterality Date  . Carpal tunnel release  unknown    left hand  . Achilles tendon repair  1970's    left ankle  . Arthroscopic repair acl      left knee cap  . Cardiac catheterization      loop recorder  . Post ganglionectomy  1970's    "for migraine headaches"  . Tubal ligation  1980's  . Pouch string  [id number]    "did  this 3 times (once w/each pregnancy)"    Social History Shelby Anderson  reports that she has never smoked. She has never used smokeless tobacco. She reports that she does not drink alcohol or use illicit drugs.  family history includes Cancer in her brother, brother, brother, father, paternal grandmother, sister, and sister; Diabetes in her father and sister; Heart disease in her father, maternal grandfather, maternal grandmother, mother, paternal grandfather, paternal grandmother, and sister; Hypertension in her father, mother, sister, and sister; Malignant hyperthermia in her father. There is no history of Anesthesia problems, Hypotension, or Pseudochol deficiency.  Allergies  Allergen Reactions  . Oxycontin [Oxycodone Hcl]  Anaphylaxis    hives, trouble breathing, tongue swelling (can't  tolerate oxycontin ) \  . Aspirin Other (See Comments)    GI Bleeds  . Darvocet [Propoxyphene N-Acetaminophen] Hives  . Lodine [Etodolac] Hives and Swelling  . Nitroglycerin     IV-BP drops  . Penicillins Other (See Comments)    unknown  . Ultram [Tramadol Hcl] Hives  . Valium Other (See Comments)    Circulation problems       PHYSICAL EXAMINATION: Vital signs: BP 100/70 mmHg  Pulse 60  Ht 5\' 6"  (1.676 m)  Wt 220 lb (99.791 kg)  BMI 35.53 kg/m2  SpO2 96%  Constitutional: Pleasant, obese, generally well-appearing, no acute distress Psychiatric: alert and oriented x3, cooperative Eyes: extraocular movements intact, anicteric, conjunctiva pink Mouth: oral pharynx moist, no lesions Neck: supple without thyromegaly Lymph: no lymphadenopathy Cardiovascular: heart regular rate and rhythm, no murmur Lungs: clear to auscultation bilaterally Abdomen: soft, obese, nontender, nondistended, no obvious ascites, no peritoneal signs, normal bowel sounds, no organomegaly Rectal: Omitted Extremities: no clubbing cyanosis or lower extremity edema bilaterally Skin: no lesions on visible extremities Neuro: No focal deficits. Cranial nerves intact  ASSESSMENT:  #1. GERD. Deteriorated #2. History of peptic stricture status post dilation. Solid food dysphagia resolved #3. Liquid dysphagia consistent with dysmotility. Stable #4. Obesity. Ongoing #5. Nausea without vomiting #6. Bloating. No alarm features   PLAN:  #1. Reflux precautions #2. Prescribe Nexium 40 mg daily #3. Prescribe Phenergan 25 mg as needed for nausea #4. Prescribe Carafate as needed for breakthrough reflux. She prefers this #5. Moderate exercise and sustained weight loss stressed #6. GI office follow-up 2 years. Sooner if needed #7. Repeat colonoscopy 2020 #8. Resume general medical care with PCP  25 minutes was spent face-to-face with the patient.  Greater than 50% of the time use for counseling regarding her multiple GI diagnoses and reviewing plan as outlined. Questions answered

## 2016-01-13 NOTE — Patient Instructions (Signed)
We have sent the following medications to your pharmacy for you to pick up at your convenience: Nexium, Carafate, Phenergan

## 2016-01-15 ENCOUNTER — Other Ambulatory Visit: Payer: Self-pay | Admitting: Family Medicine

## 2016-01-18 ENCOUNTER — Other Ambulatory Visit: Payer: Self-pay | Admitting: Gynecology

## 2016-01-24 DIAGNOSIS — J45909 Unspecified asthma, uncomplicated: Secondary | ICD-10-CM | POA: Diagnosis not present

## 2016-02-16 ENCOUNTER — Other Ambulatory Visit: Payer: Self-pay | Admitting: Family

## 2016-02-20 DIAGNOSIS — M47816 Spondylosis without myelopathy or radiculopathy, lumbar region: Secondary | ICD-10-CM | POA: Diagnosis not present

## 2016-02-20 DIAGNOSIS — Z79891 Long term (current) use of opiate analgesic: Secondary | ICD-10-CM | POA: Diagnosis not present

## 2016-02-20 DIAGNOSIS — M4726 Other spondylosis with radiculopathy, lumbar region: Secondary | ICD-10-CM | POA: Diagnosis not present

## 2016-02-20 DIAGNOSIS — G894 Chronic pain syndrome: Secondary | ICD-10-CM | POA: Diagnosis not present

## 2016-02-21 ENCOUNTER — Other Ambulatory Visit: Payer: Self-pay | Admitting: Internal Medicine

## 2016-02-21 DIAGNOSIS — J45909 Unspecified asthma, uncomplicated: Secondary | ICD-10-CM | POA: Diagnosis not present

## 2016-02-23 ENCOUNTER — Other Ambulatory Visit: Payer: Self-pay | Admitting: Gynecology

## 2016-02-29 ENCOUNTER — Encounter: Payer: Self-pay | Admitting: Pulmonary Disease

## 2016-02-29 ENCOUNTER — Ambulatory Visit (INDEPENDENT_AMBULATORY_CARE_PROVIDER_SITE_OTHER): Payer: Medicare Other | Admitting: Pulmonary Disease

## 2016-02-29 VITALS — BP 110/82 | HR 60 | Temp 98.3°F | Ht 66.0 in | Wt 222.0 lb

## 2016-02-29 DIAGNOSIS — J209 Acute bronchitis, unspecified: Secondary | ICD-10-CM | POA: Diagnosis not present

## 2016-02-29 DIAGNOSIS — J4530 Mild persistent asthma, uncomplicated: Secondary | ICD-10-CM | POA: Diagnosis not present

## 2016-02-29 DIAGNOSIS — J9611 Chronic respiratory failure with hypoxia: Secondary | ICD-10-CM

## 2016-02-29 LAB — POC INFLUENZA A&B (BINAX/QUICKVUE)
INFLUENZA A, POC: NEGATIVE
Influenza B, POC: NEGATIVE

## 2016-02-29 MED ORDER — PREDNISONE 10 MG PO TABS: ORAL_TABLET | ORAL | Status: DC

## 2016-02-29 MED ORDER — AZITHROMYCIN 250 MG PO TABS: ORAL_TABLET | ORAL | Status: DC

## 2016-02-29 NOTE — Patient Instructions (Signed)
Rapid flu test Zpak Prednisone 10 mg tabs  Take 2 tabs daily with food x 5ds, then 1 tab daily with food x 5ds then STOP Take this only if worse wheezing  Call if worse

## 2016-02-29 NOTE — Progress Notes (Signed)
   Subjective:    Patient ID: Shelby Anderson, female    DOB: 15-Sep-1956, 60 y.o.   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  She was evaluated for CAD  at [facility] for syncope & has a loop recorder - brady & int a - fibn Her husband smokes. She has positive PPD.  02/29/2016  Chief Complaint  Patient presents with  . Follow-up    nasal congestion, fever, body aches, wheezing at night.  Chest gets tight at night.    Family members / grandkids sick - one had the flu She developed sore throat, nasla congestion, then cough with yellow sputum Low grade fevers x 3 days OK daytime - noct wheezing   11/2015 ONO positive , Started on oxygen 2l/m .Feels better sleeping now.   Rapid flu test was negative  Significant tests/ events  Spirometry 12/2011 - no airway obstruction -No reversibility with bronchodilator , mod restriction   CXR LLL nodular infx - resolved 04/2012  Home sleep test 07/2012 - showed desatn,no OSA  12/2013 -admitted  asthma exacerbation,CAP, RSV bronchitis.  EGD 03/2011 ([facility]) - peptic ulcer disease.  12/2013  barium esophagram - distal esophageal stricture.   stricture dilated ([place])    Review of Systems neg for any significant  dysphagia, itching,  chills, sweats, unintended wt loss, pleuritic or exertional cp, hempoptysis, orthopnea pnd or change in chronic leg swelling.  Also denies presyncope, palpitations, heartburn, abdominal pain, nausea, vomiting, diarrhea or change in bowel or urinary habits, dysuria,hematuria, rash, arthralgias, visual complaints, headache, numbness weakness or ataxia.     Objective:   Physical Exam  Gen. Pleasant, obese, in no distress ENT - no lesions, no post nasal drip Neck: No JVD, no thyromegaly, no carotid bruits Lungs: no use of accessory  muscles, no dullness to percussion, decreased without rales or rhonchi  Cardiovascular: Rhythm regular, heart sounds  normal, no murmurs or gallops, no peripheral edema Musculoskeletal: No deformities, no cyanosis or clubbing , no tremors        Assessment & Plan:

## 2016-02-29 NOTE — Assessment & Plan Note (Signed)
Well controlled on dulera Albuterol prn

## 2016-02-29 NOTE — Assessment & Plan Note (Signed)
Rapid flu test Zpak Prednisone 10 mg tabs  Take 2 tabs daily with food x 5ds, then 1 tab daily with food x 5ds then STOP Take this only if worse wheezing  Call if worse

## 2016-02-29 NOTE — Assessment & Plan Note (Signed)
2L O2 during sleep

## 2016-03-05 ENCOUNTER — Ambulatory Visit (INDEPENDENT_AMBULATORY_CARE_PROVIDER_SITE_OTHER): Payer: Medicare Other | Admitting: Family Medicine

## 2016-03-05 DIAGNOSIS — R69 Illness, unspecified: Secondary | ICD-10-CM

## 2016-03-05 NOTE — Progress Notes (Signed)
NO SHOW. We have a No Show policy for staff to contact pts whom no show to assist in rescheduling. On ROC prior to appointment:  New diagnosis of DM: -a recent transfer to [facility], appears has likely been borderline/mild diabetic for some time - recent hgba1c 6.8 11/2015, LDL at goal, cr 0.63 -referred to diabetes educator, advised lifestyle changes -diet and exercise:  -hx CAD (sees cardiologist), HLD, HTN, orthostattic hypotension - on statin and asa, not on acei - has asthma  GERD: -hx bad reflux, esophageal stricture s/p dilation and gastric ulcer -sees Dr. Henrene Pastor for this - seen recently per review of chart -on nexium 40mg  bid, carafate, prn phenergan  CAD hx MI x2 per her report/Arrythmia/HTN/HLD/orthostatic hypotension/presyncope: -seeing several cardiologists -meds: asa, metoprolol, fish oil, isosorbide, florinef, K+, lipitor -denies:   Asthma/Allergies: -sees pulmonologist for this -meds: singulair -on review of chart recently had exacerbation and treated with steroids and abx and doing better  Insomnia: -uses benydrl occ for this -eszopiclone on med list from ? [facility] -hx of low O2 at night  Chronic back pain: -hx DDD and neuropathy -seeing [place] pain management specialist for this -on daily oxycodone 30mg  3 times daily, lyrica  and mobic daily -had MRI with last PCP, DDD, but no neural foraminal stenosis or canal stenosis -bilat peripheral neuropathy from the knees down -no anemia on labs  MDD: -identified on screening forms at preventive visit and she opted to try CBT - -reported did not have time at follow up and reported doing fine/baseline at last visit -reported has had her whole life -Chronic symptoms: mildly constant depressed mood and hopeless feelings at times, no SI, panic or manic symptoms  HRT: -on prometrium with her gynecologist, Dr. Toney Rakes

## 2016-03-08 ENCOUNTER — Ambulatory Visit (INDEPENDENT_AMBULATORY_CARE_PROVIDER_SITE_OTHER): Payer: Medicare Other | Admitting: Family Medicine

## 2016-03-08 ENCOUNTER — Encounter: Payer: Self-pay | Admitting: Family Medicine

## 2016-03-08 VITALS — BP 132/84 | HR 92 | Temp 99.6°F | Ht 66.0 in | Wt 223.5 lb

## 2016-03-08 DIAGNOSIS — I10 Essential (primary) hypertension: Secondary | ICD-10-CM

## 2016-03-08 DIAGNOSIS — E785 Hyperlipidemia, unspecified: Secondary | ICD-10-CM

## 2016-03-08 DIAGNOSIS — E119 Type 2 diabetes mellitus without complications: Secondary | ICD-10-CM | POA: Diagnosis not present

## 2016-03-08 DIAGNOSIS — I2510 Atherosclerotic heart disease of native coronary artery without angina pectoris: Secondary | ICD-10-CM

## 2016-03-08 DIAGNOSIS — F330 Major depressive disorder, recurrent, mild: Secondary | ICD-10-CM

## 2016-03-08 DIAGNOSIS — R209 Unspecified disturbances of skin sensation: Secondary | ICD-10-CM

## 2016-03-08 DIAGNOSIS — R208 Other disturbances of skin sensation: Secondary | ICD-10-CM

## 2016-03-08 DIAGNOSIS — I951 Orthostatic hypotension: Secondary | ICD-10-CM

## 2016-03-08 DIAGNOSIS — G629 Polyneuropathy, unspecified: Secondary | ICD-10-CM

## 2016-03-08 NOTE — Patient Instructions (Addendum)
BEFORE YOU LEAVE: -lab visit in 1 month -follow up in 4 months  Please schedule a diabetic eye exam.  We recommend the following healthy lifestyle measures: - eat a healthy whole foods diet consisting of regular small meals composed of vegetables, fruits, beans, nuts, seeds, healthy meats such as white chicken and fish and whole grains.  - avoid sweets, white starchy foods, fried foods, fast food, processed foods, sodas, red meet and other fattening foods.  - get a least 150-300 minutes of aerobic exercise per week.

## 2016-03-08 NOTE — Progress Notes (Signed)
Pre visit review using our clinic review tool, if applicable. No additional management support is needed unless otherwise documented below in the visit note. 

## 2016-03-08 NOTE — Progress Notes (Signed)
HPI:  Shelby Anderson is a pleasant 60 yo with a complicated PMH here for a follow up visit. She has no showed several visits, including her last visit here.  New diagnosis of DM: -a recent transfer to me, appears has likely been borderline/mild diabetic for some time - recent hgba1c 6.8 11/2015, LDL at goal, cr 0.63 -referred to diabetes educator - she reports this has been helpful and that she has made changes in her diet -no regular exercise, but she is active taking care of grandchildren -hx CAD (sees cardiologist), HLD, HTN, orthostattic hypotension - on statin and asa, not on acei - has asthma  GERD: -hx bad reflux, esophageal stricture s/p dilation and gastric ulcer -sees Dr. Henrene Pastor for this - seen recently per review of chart -on nexium 40mg  bid, carafate, prn phenergan  CAD hx MI x2 per her report/Arrythmia/HTN/HLD/orthostatic hypotension/presyncope: -seeing several cardiologists -meds: asa, metoprolol, fish oil, isosorbide, florinef - recently decreased, K+, lipitor -since decreasing her Florinef she has noticed a cold sensation in her hands whenever she places them in cold water or goes outside when it is cold, her hands also seemed to become white in the fingers, they feel better when she warms them back up -denies: CP, SOB, palpitations, blue hands or other areas of the body becoming cold  Asthma/Allergies: -sees pulmonologist for this -meds: singulair -on review of chart recently had exacerbation and treated with steroids and abx and doing better  Insomnia: -uses benydrl occ for this -eszopiclone on med list from ? [facility] -hx of low O2 at night  Chronic back pain: -hx DDD and neuropathy -seeing [place] pain management specialist for this -on daily oxycodone 30mg  3 times daily, lyrica and mobic daily -had MRI with last PCP, DDD, but no neural foraminal stenosis or canal stenosis -bilat peripheral neuropathy from the knees down -no anemia on  labs  MDD: -identified on screening forms at preventive visit and she opted to try CBT - -reported did not have time at follow up and reported doing fine/baseline at last visit -reported has had her whole life -Chronic symptoms: mildly constant depressed mood and hopeless feelings at times, no SI, panic or manic symptoms  HRT: -on prometrium with her gynecologist, Dr. Toney Rakes  ROS: See pertinent positives and negatives per HPI.  Past Medical History  Diagnosis Date  . Angina   . Heart murmur   . Mental disorder   . Coronary artery disease     Arrythmia, orthostatic hypotension, HLD, HTN; sees Dr. Einar Gip  . Hypertension   . Recurrent upper respiratory infection (URI)   . Tuberculosis     + TB SKIN TEST  . GERD (gastroesophageal reflux disease)     hx hiatal hernia, stricture and gastric ulcer  . Headache(784.0)   . Pneumonia   . Anxiety and depression   . TMJ (dislocation of temporomandibular joint)   . Difficult intubation     "TMJ & woke up when they were still cutting on me"  . PONV (postoperative nausea and vomiting)   . Dysrhythmia     sees Dr. Einar Gip and a cardiologist at [facility]  . Atrial fibrillation ([place])     h/o "AF w/frequent PVCs"  . Myocardial infarction [facility]) 1980's & 1990;    sees Dr. Einar Gip  . Shortness of breath 11/20/11    "all the time", sees pulmonlogy, ? asthma  . Anemia     "chronic"  . Stomach ulcer     "3 small; found in 05/2011"  .  History of migraines     "dx'd when I was in my teens"  . Fibromyalgia     "in my legs"  . Chronic back pain greater than 3 months duration     on chronic narcotics, treated at pain clinic  . Hyperlipemia   . Stenotic cervical os   . Fibroids   . Mild episode of recurrent major depressive disorder ([place]) 12/06/2015  . Type 2 diabetes mellitus without complication, without long-term current use of insulin ([place]) 12/06/2015  . Esophageal stricture   . Hiatal hernia   . Fatty liver   . Colon polyps      hyperplastic    Past Surgical History  Procedure Laterality Date  . Carpal tunnel release  unknown    left hand  . Achilles tendon repair  1970's    left ankle  . Arthroscopic repair acl      left knee cap  . Cardiac catheterization      loop recorder  . Post ganglionectomy  1970's    "for migraine headaches"  . Tubal ligation  1980's  . Pouch string  [phone]    "did this 3 times (once w/each pregnancy)"    Family History  Problem Relation Age of Onset  . Malignant hyperthermia Father   . Hypertension Father   . Heart disease Father   . Diabetes Father   . Cancer Father     skin  . Anesthesia problems Neg Hx   . Hypotension Neg Hx   . Pseudochol deficiency Neg Hx   . Hypertension Mother   . Heart disease Mother   . Cancer Sister     CERVICAL  . Hypertension Sister   . Cancer Brother     MELANOMA  . Heart disease Maternal Grandmother   . Heart disease Maternal Grandfather   . Cancer Paternal Grandmother     ?   Marland Kitchen Heart disease Paternal Grandmother   . Heart disease Paternal Grandfather   . Cancer Brother     LUNG  . Diabetes Sister   . Hypertension Sister   . Heart disease Sister   . Cancer Sister   . Cancer Brother     Social History   Social History  . Marital Status: Married    Spouse Name: N/A  . Number of Children: 3  . Years of Education: N/A   Occupational History  . Retired Therapist, sports    Social History Main Topics  . Smoking status: Never Smoker   . Smokeless tobacco: Never Used  . Alcohol Use: No  . Drug Use: No  . Sexual Activity: Yes    Birth Control/ Protection: Surgical   Other Topics Concern  . None   Social History Narrative     Current outpatient prescriptions:  .  albuterol (PROVENTIL HFA;VENTOLIN HFA) 108 (90 BASE) MCG/ACT inhaler, Inhale 2 puffs into the lungs every 4 (four) hours as needed. Shortness of breath, Disp: 1 Inhaler, Rfl: 3 .  albuterol (PROVENTIL) (2.5 MG/3ML) 0.083% nebulizer solution, Take 3 mLs (2.5 mg total)  by nebulization every 6 (six) hours as needed for wheezing or shortness of breath., Disp: 75 mL, Rfl: 12 .  alclomethasone (ACLOVATE) 0.05 % cream, Apply topically 2 (two) times daily., Disp: 60 g, Rfl: 2 .  aspirin 81 MG EC tablet, Take 81 mg by mouth daily. Swallow whole. , Disp: , Rfl:  .  atorvastatin (LIPITOR) 40 MG tablet, TAKE 1 TABLET BY MOUTH EVERY DAY, Disp: 90 tablet, Rfl: 1 .  azithromycin (ZITHROMAX) 250 MG tablet, Take 2 tablets today, then 1 tablet daily until gone., Disp: 6 tablet, Rfl: 0 .  Calcium Carbonate-Vitamin D (CALCIUM + D PO), Take 1 tablet by mouth 3 (three) times daily.  , Disp: , Rfl:  .  Cinnamon 500 MG capsule, Take 1,000 mg by mouth 2 (two) times daily. , Disp: , Rfl:  .  esomeprazole (NEXIUM) 40 MG capsule, TAKE 1 CAPSULE (40 MG TOTAL) BY MOUTH 2 (TWO) TIMES DAILY., Disp: 60 capsule, Rfl: 11 .  Eszopiclone (ESZOPICLONE) 3 MG TABS, Take 3 mg by mouth at bedtime as needed., Disp: , Rfl:  .  fludrocortisone (FLORINEF) 0.1 MG tablet, TAKE 1 TABLET BY MOUTH TWICE A DAY (Patient taking differently: TAKE 1 TABLET BY MOUTH once a day), Disp: 60 tablet, Rfl: 5 .  isosorbide mononitrate (IMDUR) 60 MG 24 hr tablet, Take 1 tablet (60 mg total) by mouth daily., Disp: 90 tablet, Rfl: 1 .  KLOR-CON M20 20 MEQ tablet, TAKE 1 TABLET (20 MEQ TOTAL) BY MOUTH DAILY., Disp: 30 tablet, Rfl: 3 .  L-Methylfolate (DEPLIN) 7.5 MG TABS, Take 7.5 mg by mouth daily.  , Disp: , Rfl:  .  lidocaine (LIDODERM) 5 %, Place 2 patches onto the skin as needed. Remove & Discard patch within 12 hours or as directed by MD, Disp: , Rfl:  .  LYRICA 75 MG capsule, , Disp: , Rfl:  .  meloxicam (MOBIC) 7.5 MG tablet, TAKE 1 TABLET (7.5 MG TOTAL) BY MOUTH DAILY AS NEEDED FOR PAIN., Disp: 30 tablet, Rfl: 3 .  metoprolol (LOPRESSOR) 50 MG tablet, Take 1 tablet (50 mg total) by mouth 2 (two) times daily., Disp: 180 tablet, Rfl: 1 .  mometasone-formoterol (DULERA) 200-5 MCG/ACT AERO, Inhale 2 puffs into the lungs 2  (two) times daily., Disp: 1 Inhaler, Rfl: 4 .  montelukast (SINGULAIR) 10 MG tablet, Take 1 tablet (10 mg total) by mouth at bedtime., Disp: 90 tablet, Rfl: 2 .  Multiple Vitamin (MULITIVITAMIN WITH MINERALS) TABS, Take 1 tablet by mouth daily.  , Disp: , Rfl:  .  nitroGLYCERIN (NITROSTAT) 0.3 MG SL tablet, Place 0.3 mg under the tongue every 5 (five) minutes as needed for chest pain (as needed)., Disp: , Rfl:  .  NONFORMULARY OR COMPOUNDED ITEM, Estradiol .02% 1 ML Prefilled Applicator Sig: apply vaginally twice a week #90 Day Supply with 4 refills, Disp: 1 each, Rfl: 4 .  Oxycodone HCl 20 MG TABS, Take 30 mg by mouth every 4 (four) hours as needed (pain.). , Disp: , Rfl:  .  predniSONE (DELTASONE) 10 MG tablet, Take 2 tablets x 5 days, 1 tab x 5 days, then STOP - take with food, Disp: 15 tablet, Rfl: 0 .  progesterone (PROMETRIUM) 200 MG capsule, TAKE ONE TABLET THE FIRST 12 DAYS OF THE MONTH, Disp: 36 capsule, Rfl: 0 .  promethazine (PHENERGAN) 25 MG tablet, TAKE 1 TABLET BY MOUTH AT BEDTIME AS NEEDED FOR NAUSEA AND VOMITING, Disp: 30 tablet, Rfl: 1 .  sucralfate (CARAFATE) 1 GM/10ML suspension, Take 10 mLs (1 g total) by mouth 4 (four) times daily -  with meals and at bedtime. Reported on 01/05/2016, Disp: 420 mL, Rfl: 6 .  [DISCONTINUED] Potassium Chloride ER 20 MEQ TBCR, Take 1 tablet by mouth daily., Disp: 30 tablet, Rfl: 3  EXAM:  Filed Vitals:   03/08/16 1334  BP: 132/84  Pulse: 92  Temp: 99.6 F (37.6 C)    Body mass index is 36.09 kg/(m^2).  GENERAL: vitals reviewed and listed above, alert, oriented, appears well hydrated and in no acute distress  HEENT: atraumatic, conjunttiva clear, no obvious abnormalities on inspection of external nose and ears  NECK: no obvious masses on inspection  LUNGS: clear to auscultation bilaterally, no wheezes, rales or rhonchi, good air movement  CV: HRRR, no peripheral edema, normal pedal and radial pulses, normal coloration and cap refill in  hands bilaterally, hands warm and well perfused  MS: moves all extremities without noticeable abnormality  PSYCH: pleasant and cooperative, no obvious depression or anxiety  ASSESSMENT AND PLAN:  Discussed the following assessment and plan:  Type 2 diabetes mellitus without complication, without long-term current use of insulin (HCC) - Plan: Hemoglobin A1c, Microalbumin/Creatinine Ratio, Urine, Basic metabolic panel  Hyperlipemia  Essential hypertension  Coronary artery disease involving native coronary artery of native heart without angina pectoris  Orthostatic hypotension  Cold hands - Plan: TSH, CBC (no diff)  Neuropathy (HCC)  Mild episode of recurrent major depressive disorder (HCC)  -labs, she did not want to check today as has recently had steroids for her lung disease - advised a lab visit in 1 month to follow-up on her diabetes -lifestyle recs -advised she notify her cardiologist of the change she has perceived in circulation in her hands since change in florinef dx, may be related, will check labs and advised follow up if persists or other concerns, no findings on exam today -Patient advised to return or notify a doctor immediately if symptoms worsen or persist or new concerns arise.  Patient Instructions  BEFORE YOU LEAVE: -lab visit in 1 month -follow up in 4 months  Please schedule a diabetic eye exam.  We recommend the following healthy lifestyle measures: - eat a healthy whole foods diet consisting of regular small meals composed of vegetables, fruits, beans, nuts, seeds, healthy meats such as white chicken and fish and whole grains.  - avoid sweets, white starchy foods, fried foods, fast food, processed foods, sodas, red meet and other fattening foods.  - get a least 150-300 minutes of aerobic exercise per week.         Colin Benton R.

## 2016-03-15 ENCOUNTER — Other Ambulatory Visit: Payer: Self-pay | Admitting: Gynecology

## 2016-03-20 ENCOUNTER — Other Ambulatory Visit: Payer: Self-pay | Admitting: Family Medicine

## 2016-03-23 DIAGNOSIS — J45909 Unspecified asthma, uncomplicated: Secondary | ICD-10-CM | POA: Diagnosis not present

## 2016-04-02 DIAGNOSIS — Z4509 Encounter for adjustment and management of other cardiac device: Secondary | ICD-10-CM | POA: Diagnosis not present

## 2016-04-02 DIAGNOSIS — Z95818 Presence of other cardiac implants and grafts: Secondary | ICD-10-CM | POA: Diagnosis not present

## 2016-04-02 DIAGNOSIS — R55 Syncope and collapse: Secondary | ICD-10-CM | POA: Diagnosis not present

## 2016-04-09 ENCOUNTER — Other Ambulatory Visit (INDEPENDENT_AMBULATORY_CARE_PROVIDER_SITE_OTHER): Payer: Medicare Other

## 2016-04-09 DIAGNOSIS — E119 Type 2 diabetes mellitus without complications: Secondary | ICD-10-CM | POA: Diagnosis not present

## 2016-04-09 DIAGNOSIS — R209 Unspecified disturbances of skin sensation: Secondary | ICD-10-CM

## 2016-04-09 DIAGNOSIS — R208 Other disturbances of skin sensation: Secondary | ICD-10-CM | POA: Diagnosis not present

## 2016-04-09 DIAGNOSIS — R6889 Other general symptoms and signs: Secondary | ICD-10-CM

## 2016-04-09 LAB — CBC
HCT: 40.9 % (ref 36.0–46.0)
HEMOGLOBIN: 13.6 g/dL (ref 12.0–15.0)
MCHC: 33.2 g/dL (ref 30.0–36.0)
MCV: 85.9 fl (ref 78.0–100.0)
PLATELETS: 254 10*3/uL (ref 150.0–400.0)
RBC: 4.76 Mil/uL (ref 3.87–5.11)
RDW: 14.5 % (ref 11.5–15.5)
WBC: 6.1 10*3/uL (ref 4.0–10.5)

## 2016-04-09 LAB — BASIC METABOLIC PANEL
BUN: 9 mg/dL (ref 6–23)
CALCIUM: 9.1 mg/dL (ref 8.4–10.5)
CO2: 32 meq/L (ref 19–32)
CREATININE: 0.55 mg/dL (ref 0.40–1.20)
Chloride: 104 mEq/L (ref 96–112)
GFR: 120.63 mL/min (ref >60.00)
GLUCOSE: 92 mg/dL (ref 70–99)
Potassium: 3.8 mEq/L (ref 3.5–5.1)
SODIUM: 142 meq/L (ref 135–145)

## 2016-04-09 LAB — TSH: TSH: 4.97 u[IU]/mL — ABNORMAL HIGH (ref 0.35–4.50)

## 2016-04-09 LAB — MICROALBUMIN / CREATININE URINE RATIO
Creatinine,U: 114.3 mg/dL
Microalb Creat Ratio: 0.6 mg/g (ref 0.0–30.0)

## 2016-04-09 LAB — HEMOGLOBIN A1C: HEMOGLOBIN A1C: 6.8 % — AB (ref 4.6–6.5)

## 2016-04-10 ENCOUNTER — Other Ambulatory Visit: Payer: Medicare Other

## 2016-04-10 DIAGNOSIS — R6889 Other general symptoms and signs: Secondary | ICD-10-CM | POA: Diagnosis not present

## 2016-04-10 LAB — T4, FREE: Free T4: 0.81 ng/dL (ref 0.60–1.60)

## 2016-04-10 LAB — TSH: TSH: 1.88 u[IU]/mL (ref 0.35–4.50)

## 2016-04-10 NOTE — Addendum Note (Signed)
Addended by: Gari Crown D on: 04/10/2016 03:11 PM   Modules accepted: Orders

## 2016-04-15 ENCOUNTER — Other Ambulatory Visit: Payer: Self-pay | Admitting: Family Medicine

## 2016-04-16 ENCOUNTER — Other Ambulatory Visit: Payer: Self-pay | Admitting: Family Medicine

## 2016-04-20 DIAGNOSIS — Z79891 Long term (current) use of opiate analgesic: Secondary | ICD-10-CM | POA: Diagnosis not present

## 2016-04-20 DIAGNOSIS — M179 Osteoarthritis of knee, unspecified: Secondary | ICD-10-CM | POA: Diagnosis not present

## 2016-04-20 DIAGNOSIS — M47816 Spondylosis without myelopathy or radiculopathy, lumbar region: Secondary | ICD-10-CM | POA: Diagnosis not present

## 2016-04-20 DIAGNOSIS — G894 Chronic pain syndrome: Secondary | ICD-10-CM | POA: Diagnosis not present

## 2016-04-22 DIAGNOSIS — J45909 Unspecified asthma, uncomplicated: Secondary | ICD-10-CM | POA: Diagnosis not present

## 2016-04-23 ENCOUNTER — Other Ambulatory Visit: Payer: Self-pay | Admitting: Family Medicine

## 2016-04-23 ENCOUNTER — Telehealth: Payer: Self-pay | Admitting: Family Medicine

## 2016-04-23 MED ORDER — ISOSORBIDE MONONITRATE ER 60 MG PO TB24: 60.0000 mg | ORAL_TABLET | Freq: Every day | ORAL | Status: DC

## 2016-04-23 NOTE — Telephone Encounter (Signed)
Rx done for Isosorbide.  Meloxicam was denied and a note was sent to the pharmacist with the denial as the pt needs to get this from her specialist.

## 2016-04-23 NOTE — Telephone Encounter (Signed)
Pt request refill of the following:             Patient Care Coordination Note [name], CMA Thu Nov 24, 2015 11:36 AM  2L at bedtime DME: [facility]         Demographics  Shelby Anderson 60 year old female 05-Jun-1956  [address]  [place]  [address] [phone] (M)  Comm Pref: None    Advanced Directives  11/24/15 08/26/15 12/23/13  Does patient have an advance directive? No No Patient does not have advance directive, Patient would not like information  Pre-existing out of facility DNR order (yellow form or pink MOST form)   No  Problem List Coronary artery disease  Asthma  HTN (hypertension)  Arrhythmia  Orthostatic hypotension  GERD (gastroesophageal reflux disease)  Chronic back pain  Neuropathy (HCC)  Chronic respiratory failure (HCC)  Hyperlipemia  Mild episode of recurrent major depressive disorder ([place])  Type 2 diabetes mellitus without complication, without long-term current use of insulin ([place])  Health MaintenancePostponedSoonDueLate        Topic Due Last Communication    OPHTHALMOLOGY EXAM 03/16/1968     INFLUENZA VACCINE 07/03/2016     PNEUMOCOCCAL POLYSACCHARIDE VACCINE (2) 08/18/2016     HEMOGLOBIN A1C 10/10/2016     MAMMOGRAM 10/21/2016     FOOT EXAM 03/08/2017     URINE MICROALBUMIN 04/09/2017     PAP SMEAR 07/07/2017     COLONOSCOPY 12/29/2020     TETANUS/TDAP 06/28/2024     Hepatitis C Screening Completed     HIV Screening Completed    Reminders and Results None     Care Team and Communications PCPs Type  Lucretia Kern, DO General  Other Patient Care Team Members Relationship  Rigoberto Noel, MD Consulting Physician  Adrian Prows, MD Consulting Physician  Lenon Oms, MD Referring Physician  Veneda Melter, MD Referring Physician  [name], MD Consulting Physician  Terrance Mass, MD Consulting Physician  Recipients of Past Communications     Office Visit - 02/29/2016    Lucretia Kern, DO 03/01/2016 In [place]  Office Visit - 01/13/2016    Lucretia Kern, DO 01/13/2016 In B[place]  Office Visit - 01/05/2016    Rozetta Nunnery, MD 01/05/2016 Fax  Nutrition - 01/05/2016    Lucretia Kern, DO 01/05/2016 In Basket  Letter (Out) - 11/04/2015    [name] 11/04/2015 Mail  Hospital Encounter - 06/28/2014    Charlotte Crumb, MD 06/28/2014 In [place], [place] 06/28/2014 In [place]  Office Visit - 02/25/2014    [place] 02/25/2014 Mail  Office Visit - 04/24/2013    [name] 04/24/2013 Mail   Allergies   Anaphylaxis^ FD:[id number] RVAMP LAUNCH_OPTIONS;1 RVAMP " data-rvlinknum="3">Oxycontin [Oxycodone Hcl]Anaphylaxis  Other!(See!Comments)^ FD:[id number] RVAMP LAUNCH_OPTIONS;1 RVAMP " data-rvlinknum="4">AspirinOther (See Comments)  Hives^ FD:[id number] RVAMP LAUNCH_OPTIONS;1 RVAMP " data-rvlinknum="5">Darvocet [Propoxyphene N-acetaminophen]Hives  Hives!Swelling^ [id number] RVAMP LAUNCH_OPTIONS;1 RVAMP " data-rvlinknum="6">Lodine [Etodolac]Hives, Swelling  ^ FD:[id number] RVAMP LAUNCH_OPTIONS;1 RVAMP " data-rvlinknum="7">Nitroglycerin  Other!(See!Comments)^ FD:[id number] RVAMP LAUNCH_OPTIONS;1 RVAMP " data-rvlinknum="8">PenicillinsOther (See Comments)  Hives^ FD:[id number] RVAMP LAUNCH_OPTIONS;1 RVAMP " data-rvlinknum="9">Ultram [place] Mode Hcl]Hives  Other!(See!Comments)^ [id number] RVAMP LAUNCH_OPTIONS;1 RVAMP " data-rvlinknum="10">ValiumOther (See Comments)  Medications Outpatient Medications (28) Hospital Medications (0) Clinic-Administered Medications (0)   albuterol (PROVENTIL HFA;VENTOLIN HFA) 108 (90 BASE) MCG/ACT inhaler   albuterol (PROVENTIL) (2.5 MG/3ML) 0.083% nebulizer solution   alclomethasone (ACLOVATE) 0.05 % cream   aspirin 81 MG EC tablet   atorvastatin (  LIPITOR) 40 MG tablet   azithromycin (ZITHROMAX) 250 MG tablet   Calcium Carbonate-Vitamin D (CALCIUM + D PO)   Cinnamon 500 MG capsule   esomeprazole  (NEXIUM) 40 MG capsule   Eszopiclone (ESZOPICLONE) 3 MG TABS   fludrocortisone (FLORINEF) 0.1 MG tablet   isosorbide mononitrate (IMDUR) 60 MG 24 hr tablet   KLOR-CON M20 20 MEQ tablet   L-Methylfolate (DEPLIN) 7.5 MG TABS   lidocaine (LIDODERM) 5 %   LYRICA 75 MG capsule   meloxicam (MOBIC) 7.5 MG tablet   metoprolol (LOPRESSOR) 50 MG tablet   mometasone-formoterol (DULERA) 200-5 MCG/ACT AERO   montelukast (SINGULAIR) 10 MG tablet   Multiple Vitamin (MULITIVITAMIN WITH MINERALS) TABS   nitroGLYCERIN (NITROSTAT) 0.3 MG SL tablet   NONFORMULARY OR COMPOUNDED ITEM   Oxycodone HCl 20 MG TABS   predniSONE (DELTASONE) 10 MG tablet   progesterone (PROMETRIUM) 200 MG capsule   promethazine (PHENERGAN) 25 MG tablet   sucralfate (CARAFATE) 1 GM/10ML suspension     Preferred Pharmacies    [id number] [place], [place] - [phone] (Phone) [phone] (Fax)  [place], [place] [phone] (Phone) [phone] (Fax)  [place], [place] [address] [phone] (Phone) [phone] (Fax)  [id number] - [place] [phone] (Phone) [phone] (Fax)  Immunizations/Injections    Influenza Split 08/19/2011  Influenza Whole 01/03/2013  Influenza,inj,Quad PF,36+ Mos 08/26/2015, 09/15/2014, 08/20/2013  Pneumococcal Polysaccharide-23 08/19/2011  Tdap 06/28/2014  Significant History/Details   Smoking: Never Smoker   Smokeless Tobacco: Never Used  Alcohol: No  Comments: pt signed party release for Elyse Jarvis (spouse)  3 open orders  Preferred Language: English  Specialty CommentsShow AllReport No comments regarding your specialty  Family Comments None                                         Oxygen Therapy No data recorded  My Last Outpatient Progress Note There are no Outpatient notes of this type for this patient    error      Phamacy:

## 2016-04-23 NOTE — Telephone Encounter (Signed)
Pt request refill of the following:  isosorbide mononitrate (IMDUR) 60 MG 24 hr tablet  ,  meloxicam (MOBIC) 7.5 MG tablet   [facility] [address]

## 2016-05-11 ENCOUNTER — Encounter: Payer: Medicare Other | Admitting: Gynecology

## 2016-05-21 ENCOUNTER — Encounter: Payer: Self-pay | Admitting: Family Medicine

## 2016-05-21 ENCOUNTER — Ambulatory Visit (INDEPENDENT_AMBULATORY_CARE_PROVIDER_SITE_OTHER): Payer: Medicare Other | Admitting: Family Medicine

## 2016-05-21 VITALS — BP 108/70 | HR 72 | Temp 98.3°F | Ht 66.0 in | Wt 221.3 lb

## 2016-05-21 DIAGNOSIS — G629 Polyneuropathy, unspecified: Secondary | ICD-10-CM

## 2016-05-21 DIAGNOSIS — M25562 Pain in left knee: Secondary | ICD-10-CM

## 2016-05-21 DIAGNOSIS — G8929 Other chronic pain: Secondary | ICD-10-CM

## 2016-05-21 MED ORDER — MELOXICAM 7.5 MG PO TABS: ORAL_TABLET | ORAL | Status: DC

## 2016-05-21 NOTE — Patient Instructions (Addendum)
Shelby Anderson, please obtain records from Dr. Hardin Negus, pain specialist.  Shelby Anderson, please follow up with your pain specialist for your pain concerns. I do not advised that you take meloxicam on a regular basis given your other healthy issues and risks.  We placed a referral for you as discussed to the orthopedic specialist. It usually takes about 1-2 weeks to process and schedule this referral. If you have not heard from [place] regarding this appointment in 2 weeks please contact our office.

## 2016-05-21 NOTE — Progress Notes (Signed)
Pre visit review using our clinic review tool, if applicable. No additional management support is needed unless otherwise documented below in the visit note. 

## 2016-05-21 NOTE — Progress Notes (Addendum)
HPI:  Acute visit for:  Chronic Pain: -sees pain specialist, Dr. Hardin Negus -has chronic pain in multiple locations, known severe OA L knee and neuropathy -reports meloxicam is the only thing that helps her pain - primary area of concern is her L knee -recently in increase opoids and lyrica with pain management -wants me to rx mobic - I have explained her numerous risks with taking this, she perfer taking despite risks and also wants ortho referral as feels frustrated with pain clinic management of her L knee pain   ROS: See pertinent positives and negatives per HPI.  Past Medical History  Diagnosis Date  . Angina   . Heart murmur   . Mental disorder   . Coronary artery disease     Arrythmia, orthostatic hypotension, HLD, HTN; sees Dr. Einar Gip  . Hypertension   . Recurrent upper respiratory infection (URI)   . Tuberculosis     + TB SKIN TEST  . GERD (gastroesophageal reflux disease)     hx hiatal hernia, stricture and gastric ulcer  . Headache(784.0)   . Pneumonia   . Anxiety and depression   . TMJ (dislocation of temporomandibular joint)   . Difficult intubation     "TMJ & woke up when they were still cutting on me"  . PONV (postoperative nausea and vomiting)   . Dysrhythmia     sees Dr. Einar Gip and a cardiologist at [facility]  . Atrial fibrillation ([place])     h/o "AF w/frequent PVCs"  . Myocardial infarction [facility]) 1980's & 1990;    sees Dr. Einar Gip  . Shortness of breath 11/20/11    "all the time", sees pulmonlogy, ? asthma  . Anemia     "chronic"  . Stomach ulcer     "3 small; found in 05/2011"  . History of migraines     "dx'd when I was in my teens"  . Fibromyalgia     "in my legs"  . Chronic back pain greater than 3 months duration     on chronic narcotics, treated at pain clinic  . Hyperlipemia   . Stenotic cervical os   . Fibroids   . Mild episode of recurrent major depressive disorder ([place]) 12/06/2015  . Type 2 diabetes mellitus without complication, without  long-term current use of insulin ([place]) 12/06/2015  . Esophageal stricture   . Hiatal hernia   . Fatty liver   . Colon polyps     hyperplastic    Past Surgical History  Procedure Laterality Date  . Carpal tunnel release  unknown    left hand  . Achilles tendon repair  1970's    left ankle  . Arthroscopic repair acl      left knee cap  . Cardiac catheterization      loop recorder  . Post ganglionectomy  1970's    "for migraine headaches"  . Tubal ligation  1980's  . Pouch string  [phone]    "did this 3 times (once w/each pregnancy)"    Family History  Problem Relation Age of Onset  . Malignant hyperthermia Father   . Hypertension Father   . Heart disease Father   . Diabetes Father   . Cancer Father     skin  . Anesthesia problems Neg Hx   . Hypotension Neg Hx   . Pseudochol deficiency Neg Hx   . Hypertension Mother   . Heart disease Mother   . Cancer Sister     CERVICAL  . Hypertension Sister   .  Cancer Brother     MELANOMA  . Heart disease Maternal Grandmother   . Heart disease Maternal Grandfather   . Cancer Paternal Grandmother     ?   Marland Kitchen Heart disease Paternal Grandmother   . Heart disease Paternal Grandfather   . Cancer Brother     LUNG  . Diabetes Sister   . Hypertension Sister   . Heart disease Sister   . Cancer Sister   . Cancer Brother     Social History   Social History  . Marital Status: Married    Spouse Name: N/A  . Number of Children: 3  . Years of Education: N/A   Occupational History  . Retired Therapist, sports    Social History Main Topics  . Smoking status: Never Smoker   . Smokeless tobacco: Never Used  . Alcohol Use: No  . Drug Use: No  . Sexual Activity: Yes    Birth Control/ Protection: Surgical   Other Topics Concern  . None   Social History Narrative     Current outpatient prescriptions:  .  albuterol (PROVENTIL HFA;VENTOLIN HFA) 108 (90 BASE) MCG/ACT inhaler, Inhale 2 puffs into the lungs every 4 (four) hours as needed.  Shortness of breath, Disp: 1 Inhaler, Rfl: 3 .  albuterol (PROVENTIL) (2.5 MG/3ML) 0.083% nebulizer solution, Take 3 mLs (2.5 mg total) by nebulization every 6 (six) hours as needed for wheezing or shortness of breath., Disp: 75 mL, Rfl: 12 .  alclomethasone (ACLOVATE) 0.05 % cream, Apply topically 2 (two) times daily., Disp: 60 g, Rfl: 2 .  aspirin 81 MG EC tablet, Take 81 mg by mouth daily. Swallow whole. , Disp: , Rfl:  .  atorvastatin (LIPITOR) 40 MG tablet, TAKE 1 TABLET BY MOUTH EVERY DAY, Disp: 90 tablet, Rfl: 1 .  Calcium Carbonate-Vitamin D (CALCIUM + D PO), Take 1 tablet by mouth 3 (three) times daily.  , Disp: , Rfl:  .  Cinnamon 500 MG capsule, Take 1,000 mg by mouth 2 (two) times daily. , Disp: , Rfl:  .  esomeprazole (NEXIUM) 40 MG capsule, TAKE 1 CAPSULE (40 MG TOTAL) BY MOUTH 2 (TWO) TIMES DAILY., Disp: 60 capsule, Rfl: 11 .  Eszopiclone (ESZOPICLONE) 3 MG TABS, Take 3 mg by mouth at bedtime as needed., Disp: , Rfl:  .  fludrocortisone (FLORINEF) 0.1 MG tablet, TAKE 1 TABLET BY MOUTH TWICE A DAY (Patient taking differently: 1 tablet at bedtime), Disp: 60 tablet, Rfl: 5 .  isosorbide mononitrate (IMDUR) 60 MG 24 hr tablet, Take 1 tablet (60 mg total) by mouth daily., Disp: 90 tablet, Rfl: 1 .  KLOR-CON M20 20 MEQ tablet, TAKE 1 TABLET (20 MEQ TOTAL) BY MOUTH DAILY., Disp: 30 tablet, Rfl: 3 .  L-Methylfolate (DEPLIN) 7.5 MG TABS, Take 7.5 mg by mouth daily.  , Disp: , Rfl:  .  lidocaine (LIDODERM) 5 %, Place 2 patches onto the skin as needed. Remove & Discard patch within 12 hours or as directed by MD, Disp: , Rfl:  .  LYRICA 75 MG capsule, , Disp: , Rfl:  .  meloxicam (MOBIC) 7.5 MG tablet, TAKE 1 TABLET (7.5 MG TOTAL) BY MOUTH DAILY AS NEEDED FOR PAIN., Disp: 30 tablet, Rfl: 0 .  metoprolol (LOPRESSOR) 50 MG tablet, TAKE 1 TABLET TWICE A DAY, Disp: 180 tablet, Rfl: 1 .  mometasone-formoterol (DULERA) 200-5 MCG/ACT AERO, Inhale 2 puffs into the lungs 2 (two) times daily., Disp: 1  Inhaler, Rfl: 4 .  montelukast (SINGULAIR) 10 MG tablet,  Take 1 tablet (10 mg total) by mouth at bedtime., Disp: 90 tablet, Rfl: 2 .  Multiple Vitamin (MULITIVITAMIN WITH MINERALS) TABS, Take 1 tablet by mouth daily.  , Disp: , Rfl:  .  nitroGLYCERIN (NITROSTAT) 0.3 MG SL tablet, Place 0.3 mg under the tongue every 5 (five) minutes as needed for chest pain (as needed)., Disp: , Rfl:  .  NONFORMULARY OR COMPOUNDED ITEM, Estradiol .02% 1 ML Prefilled Applicator Sig: apply vaginally twice a week #90 Day Supply with 4 refills, Disp: 1 each, Rfl: 4 .  OXYCODONE HCL PO, Take 30 mg by mouth every 4 (four) hours as needed., Disp: , Rfl:  .  progesterone (PROMETRIUM) 200 MG capsule, TAKE ONE TABLET THE FIRST 12 DAYS OF THE MONTH, Disp: 36 capsule, Rfl: 0 .  promethazine (PHENERGAN) 25 MG tablet, TAKE 1 TABLET BY MOUTH AT BEDTIME AS NEEDED FOR NAUSEA AND VOMITING, Disp: 30 tablet, Rfl: 1 .  sucralfate (CARAFATE) 1 GM/10ML suspension, Take 10 mLs (1 g total) by mouth 4 (four) times daily -  with meals and at bedtime. Reported on 01/05/2016, Disp: 420 mL, Rfl: 6 .  [DISCONTINUED] Potassium Chloride ER 20 MEQ TBCR, Take 1 tablet by mouth daily., Disp: 30 tablet, Rfl: 3  EXAM:  Filed Vitals:   05/21/16 1329  BP: 108/70  Pulse: 72  Temp: 98.3 F (36.8 C)    Body mass index is 35.74 kg/(m^2).  GENERAL: vitals reviewed and listed above, alert, oriented, appears well hydrated and in no acute distress  HEENT: atraumatic, conjunttiva clear, no obvious abnormalities on inspection of external nose and ears  NECK: no obvious masses on inspection  LUNGS: clear to auscultation bilaterally, no wheezes, rales or rhonchi, good air movement  CV: HRRR, no peripheral edema  MS: moves all extremities without noticeable abnormality; normal inspection of both knees - no visible swelling or redness or warmth, TTP medial jt line bilat, patella crepitus L, neg drawer, neg lachman, pain with mcmurry  PSYCH: pleasant and  cooperative, no obvious depression or anxiety  ASSESSMENT AND PLAN:  Discussed the following assessment and plan:  Chronic pain  Left knee pain  Neuropathy (HCC)  -advised again of risks with long term treatment of meloxicam and do not recommend, short course refill for prn until sees ortho -referral to ortho per her request for 2nd opinion knee OA - offere neuro eval for her neuropathy - she prefers to start with ortho, reports Dr. Hardin Negus has done ncs and started lyrica - feels pain worse since started. -advised follow up with pain clinic for above -Patient advised to return or notify a doctor immediately if symptoms worsen or persist or new concerns arise.  There are no Patient Instructions on file for this visit.   Colin Benton R.

## 2016-05-23 DIAGNOSIS — J45909 Unspecified asthma, uncomplicated: Secondary | ICD-10-CM | POA: Diagnosis not present

## 2016-06-12 ENCOUNTER — Ambulatory Visit (INDEPENDENT_AMBULATORY_CARE_PROVIDER_SITE_OTHER): Payer: Medicare Other | Admitting: Gynecology

## 2016-06-12 ENCOUNTER — Encounter: Payer: Self-pay | Admitting: Gynecology

## 2016-06-12 VITALS — BP 132/88 | Ht 64.0 in | Wt 224.0 lb

## 2016-06-12 DIAGNOSIS — N950 Postmenopausal bleeding: Secondary | ICD-10-CM | POA: Diagnosis not present

## 2016-06-12 DIAGNOSIS — D251 Intramural leiomyoma of uterus: Secondary | ICD-10-CM

## 2016-06-12 DIAGNOSIS — Z79890 Hormone replacement therapy (postmenopausal): Secondary | ICD-10-CM | POA: Diagnosis not present

## 2016-06-12 DIAGNOSIS — Z01419 Encounter for gynecological examination (general) (routine) without abnormal findings: Secondary | ICD-10-CM | POA: Diagnosis not present

## 2016-06-12 DIAGNOSIS — Z780 Asymptomatic menopausal state: Secondary | ICD-10-CM | POA: Diagnosis not present

## 2016-06-12 MED ORDER — NONFORMULARY OR COMPOUNDED ITEM: Status: DC

## 2016-06-12 MED ORDER — PROGESTERONE MICRONIZED 200 MG PO CAPS
ORAL_CAPSULE | ORAL | Status: DC
Start: 2016-06-12 — End: 2016-08-20

## 2016-06-12 NOTE — Progress Notes (Signed)
Shelby Anderson 1956/08/19 [id number]   History:    60 y.o.  for annual gyn exam with the only complaint is that a few weeks ago she spotted for a couple of days. She has been on Prometrium 200 mg for 12 days of the month and using vaginal estrogen twice a week. Because of prior ultrasound indicating an endometrial thickness patient had an endometrial biopsy in February 2016 which demonstrated the following: Diagnosis  Endometrium, biopsy, uterus  - DISAGGREGATED FRAGMENTS OF ENDOMETRIAL GLANDS AND STROMA WITH FEATURES OF  BREAKDOWN.  - NEGATIVE FOR HYPERPLASIA OR MALIGNANCY.  - BENIGN ENDOCERVICAL MUCOSA.  - SQUAMOUS EPITHELIUM, NEGATIVE FOR INTRAEPITHELIAL LESION OR MALIGNANCY  Her ultrasound on that date demonstrated the following: Uterus measures 6.2 x 4.4 x 4.6 cm with endometrial stripe of 5.2 mm. Patient had 2 small intramural myomas the largest one measuring 21 x 23 mm. Right and left ovary otherwise normal. No apparent adnexal masses and no fluid in the cul-de-sac.  Colonoscopy 2012 benign polyps Bone density 2016 normal    Past medical history,surgical history, family history and social history were all reviewed and documented in the EPIC chart.  Gynecologic History No LMP recorded. Patient is postmenopausal. Contraception: post menopausal status Last Pap: 2015. Results were: normal Last mammogram: 2015. Results were: normal  Obstetric History OB History  Gravida Para Term Preterm AB SAB TAB Ectopic Multiple Living  5 3   2 2    3     # Outcome Date GA Lbr Len/2nd Weight Sex Delivery Anes PTL Lv  5 SAB           4 SAB           3 Para           2 Para           1 Para                ROS: A ROS was performed and pertinent positives and negatives are included in the history.  GENERAL: No fevers or chills. HEENT: No change in vision, no earache, sore throat or sinus congestion. NECK: No pain or stiffness. CARDIOVASCULAR: No chest pain or pressure. No  palpitations. PULMONARY: No shortness of breath, cough or wheeze. GASTROINTESTINAL: No abdominal pain, nausea, vomiting or diarrhea, melena or bright red blood per rectum. GENITOURINARY: No urinary frequency, urgency, hesitancy or dysuria. MUSCULOSKELETAL: No joint or muscle pain, no back pain, no recent trauma. DERMATOLOGIC: No rash, no itching, no lesions. ENDOCRINE: No polyuria, polydipsia, no heat or cold intolerance. No recent change in weight. HEMATOLOGICAL: No anemia or easy bruising or bleeding. NEUROLOGIC: No headache, seizures, numbness, tingling or weakness. PSYCHIATRIC: No depression, no loss of interest in normal activity or change in sleep pattern.     Exam: chaperone present  BP 132/88 mmHg  Ht 5\' 4"  (1.626 m)  Wt 224 lb (101.606 kg)  BMI 38.43 kg/m2  Body mass index is 38.43 kg/(m^2).  General appearance : Well developed well nourished female. No acute distress HEENT: Eyes: no retinal hemorrhage or exudates,  Neck supple, trachea midline, no carotid bruits, no thyroidmegaly Lungs: Clear to auscultation, no rhonchi or wheezes, or rib retractions  Heart: Regular rate and rhythm, no murmurs or gallops Breast:Examined in sitting and supine position were symmetrical in appearance, no palpable masses or tenderness,  no skin retraction, no nipple inversion, no nipple discharge, no skin discoloration, no axillary or supraclavicular lymphadenopathy Abdomen: no palpable masses or tenderness, no  rebound or guarding Extremities: no edema or skin discoloration or tenderness  Pelvic:  Bartholin, Urethra, Skene Glands: Within normal limits             Vagina: No gross lesions or discharge  Cervix: No gross lesions or discharge  Uterus  anteverted, normal size, shape and consistency, non-tender and mobile  Adnexa  Without masses or tenderness  Anus and perineum  normal   Rectovaginal  normal sphincter tone without palpated masses or tenderness             Hemoccult cards will be provided      Assessment/Plan:  60 y.o. female for annual exam patient to return to the office next week for an ultrasound to check her endometrial stripe. If endometrial stripe is created 5 mm we'll proceed with doing an endometrial biopsy due to the fact that several weeks ago she had a non-provoked spotting for 1 day. Her PCP has been doing her blood work. She is scheduled for mammogram next month. We discussed importance of monthly breast exams. Discussed importance of calcium vitamin D and weightbearing exercise for osteoporosis prevention. She is due for her next bone density study in 2018. Pap smear not indicated this year. She is scheduled for follow-up colonoscopy next year. Prescription refill for her vaginal estrogen twice a week was provided as well as for Prometrium 200 mg for 12 days of the month.  Terrance Mass MD, 3:13 PM 06/12/2016

## 2016-06-13 DIAGNOSIS — M25562 Pain in left knee: Secondary | ICD-10-CM | POA: Diagnosis not present

## 2016-06-13 DIAGNOSIS — M1712 Unilateral primary osteoarthritis, left knee: Secondary | ICD-10-CM | POA: Diagnosis not present

## 2016-06-14 ENCOUNTER — Telehealth: Payer: Self-pay | Admitting: Emergency Medicine

## 2016-06-14 MED ORDER — POTASSIUM CHLORIDE CRYS ER 20 MEQ PO TBCR: EXTENDED_RELEASE_TABLET | ORAL | Status: DC

## 2016-06-14 NOTE — Telephone Encounter (Signed)
Rx done. 

## 2016-06-14 NOTE — Telephone Encounter (Signed)
Pt requesting KLOR-CON M20 TABLET refill. Last filled 05-14-2016 with 3 refills, but I don't see this medication on her Med list.

## 2016-06-20 ENCOUNTER — Other Ambulatory Visit: Payer: Self-pay | Admitting: Family Medicine

## 2016-06-20 ENCOUNTER — Other Ambulatory Visit: Payer: Self-pay | Admitting: Internal Medicine

## 2016-06-20 DIAGNOSIS — G894 Chronic pain syndrome: Secondary | ICD-10-CM | POA: Diagnosis not present

## 2016-06-20 DIAGNOSIS — M179 Osteoarthritis of knee, unspecified: Secondary | ICD-10-CM | POA: Diagnosis not present

## 2016-06-20 DIAGNOSIS — Z79891 Long term (current) use of opiate analgesic: Secondary | ICD-10-CM | POA: Diagnosis not present

## 2016-06-20 DIAGNOSIS — M47816 Spondylosis without myelopathy or radiculopathy, lumbar region: Secondary | ICD-10-CM | POA: Diagnosis not present

## 2016-06-20 NOTE — Telephone Encounter (Signed)
Patient should not need this Rx refilled.  Last refilled 06/14/16 for #30 with 3 refills. - attempted to contact patient but phone line kept ringing.

## 2016-06-20 NOTE — Telephone Encounter (Signed)
Pt needs a refill on potassium kloe con 20 meq #30 w/refills

## 2016-06-20 NOTE — Telephone Encounter (Signed)
FYI: I did deny a refill on 7/19 for Meloxicam---last refill was 6/19 #30 with message stating for her to get this from pain specialist.

## 2016-06-21 MED ORDER — POTASSIUM CHLORIDE CRYS ER 20 MEQ PO TBCR: EXTENDED_RELEASE_TABLET | ORAL | Status: DC

## 2016-06-21 NOTE — Telephone Encounter (Signed)
Spoke with Husband (on [place]), he is aware that I have resent Potassium tablets and that she needs to fill meloxicam through her pain specialist.

## 2016-06-22 DIAGNOSIS — J45909 Unspecified asthma, uncomplicated: Secondary | ICD-10-CM | POA: Diagnosis not present

## 2016-06-27 ENCOUNTER — Ambulatory Visit (INDEPENDENT_AMBULATORY_CARE_PROVIDER_SITE_OTHER): Payer: Medicare Other

## 2016-06-27 ENCOUNTER — Encounter: Payer: Self-pay | Admitting: Gynecology

## 2016-06-27 ENCOUNTER — Ambulatory Visit (INDEPENDENT_AMBULATORY_CARE_PROVIDER_SITE_OTHER): Payer: Medicare Other | Admitting: Gynecology

## 2016-06-27 DIAGNOSIS — R938 Abnormal findings on diagnostic imaging of other specified body structures: Secondary | ICD-10-CM | POA: Diagnosis not present

## 2016-06-27 DIAGNOSIS — D251 Intramural leiomyoma of uterus: Secondary | ICD-10-CM | POA: Diagnosis not present

## 2016-06-27 DIAGNOSIS — N950 Postmenopausal bleeding: Secondary | ICD-10-CM

## 2016-06-27 DIAGNOSIS — N859 Noninflammatory disorder of uterus, unspecified: Secondary | ICD-10-CM | POA: Diagnosis not present

## 2016-06-27 DIAGNOSIS — Z780 Asymptomatic menopausal state: Secondary | ICD-10-CM

## 2016-06-27 DIAGNOSIS — R9389 Abnormal findings on diagnostic imaging of other specified body structures: Secondary | ICD-10-CM | POA: Insufficient documentation

## 2016-06-27 NOTE — Patient Instructions (Signed)

## 2016-06-28 ENCOUNTER — Encounter: Payer: Self-pay | Admitting: Gynecology

## 2016-06-28 NOTE — Progress Notes (Signed)
   Patient is a 60 year old who was seen in the office on July 11 for her annual exam. Patient with past history of leiomyomatous uteri and endometrial thickness seen on ultrasound. Review of her record indicated the past she was using vaginal estrogen twice a week and taking Prometrium for 12 days of the month and had had some spotting and in February 2016 had an endometrial biopsy which demonstrated the following:  Diagnosis  Endometrium, biopsy, uterus  - DISAGGREGATED FRAGMENTS OF ENDOMETRIAL GLANDS AND STROMA WITH FEATURES OF  BREAKDOWN.  - NEGATIVE FOR HYPERPLASIA OR MALIGNANCY.  - BENIGN ENDOCERVICAL MUCOSA.  - SQUAMOUS EPITHELIUM, NEGATIVE FOR INTRAEPITHELIAL LESION OR MALIGNANCY  Sonohysterogram also at that time did not demonstrate any intracavitary defect.  Her ultrasound had demonstrated the following: Uterus measures 6.2 x 4.4 x 4.6 cm with endometrial stripe of 5.2 mm. Patient had 2 small intramural myomas the largest one measuring 21 x 23 mm. Right and left ovary otherwise normal. No apparent adnexal masses and no fluid in the cul-de-sac.  She is here for follow-up ultrasound to compare with last year. We had discussed that her endometrial stripe was greater than 5 mm that we would proceed with an endometrial biopsy. She reports no further spotting since she stopped the estrogen. And her ultrasound today demonstrated the following:  Uterus measures 6.0 x 4.3 x 4.1 intramural/subserous fibroid 18 x 17 mm, 17 x 16 mm, wall of endometrium left 10 x 5 mm. The within the endometrium on sagittal image, Crowe image posterior wall endometrium. Right and left ovary normal echo pattern no fluid in the cul-de-sac no apparent adnexal masses.  Based on this the patient was counseled and she underwent an endometrial biopsy the following fashion: The cervix was cleansed with Betadine solution. A single-tooth tenaculum was placed on the anterior cervical lip a sterile Pipelle was  introduced into the uterine cavity and after several passes very little tissue was obtained but was submitted for histological evaluation.  Assessment/plan: Postmenopausal patient had some irregular bleeding which she was using estrogen vaginally twice a week with the addition of Prometrium. Since she stopped the estrogen she has had no further bleeding. Endometrial stripe slightly above cut off at 5.2 mm. Endometrial biopsy was obtained today minimal tissue obtained was submitted for histological evaluation. Patient will return back to the office next week for sonohysterogram to look inside the uterine cavity to compare with previous year also due to the fact that there was question whether one of the fibroids was impinging or into the uterine cavity.  Greater than 50% time was spent counseling coordinating care for this patient with postmenopausal bleeding as well as reviewing her ultrasound and doing an endometrial biopsy. Time of consultation 50 minutes

## 2016-07-02 ENCOUNTER — Encounter: Payer: Self-pay | Admitting: Adult Health

## 2016-07-02 ENCOUNTER — Other Ambulatory Visit: Payer: Self-pay | Admitting: Gynecology

## 2016-07-02 ENCOUNTER — Ambulatory Visit (INDEPENDENT_AMBULATORY_CARE_PROVIDER_SITE_OTHER): Payer: Medicare Other | Admitting: Adult Health

## 2016-07-02 VITALS — BP 120/76 | HR 60 | Temp 98.3°F | Ht 66.0 in | Wt 223.0 lb

## 2016-07-02 DIAGNOSIS — J9611 Chronic respiratory failure with hypoxia: Secondary | ICD-10-CM | POA: Diagnosis not present

## 2016-07-02 DIAGNOSIS — J4530 Mild persistent asthma, uncomplicated: Secondary | ICD-10-CM

## 2016-07-02 DIAGNOSIS — Z1231 Encounter for screening mammogram for malignant neoplasm of breast: Secondary | ICD-10-CM

## 2016-07-02 NOTE — Assessment & Plan Note (Signed)
Controlled on regimen  Plan  Continue on Dulera 2 puffs Twice daily   Continue on Singulair daily  Continue on oxygen 2l/m At bedtime   Follow up Dr. Elsworth Soho  In 4  months and As needed   Please contact office for sooner follow up if symptoms do not improve or worsen or seek emergency care

## 2016-07-02 NOTE — Progress Notes (Signed)
Subjective:    Patient ID: Shelby Anderson, female    DOB: 1956/05/15, 60 y.o.   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  She was evaluated for CAD  at [facility] for syncope.& has a loop recorder - brady & int a - fibn   Significant tests/ events  Spirometry 12/2011 - no airway obstruction -No reversibility with bronchodilator , mod restriction Her husband smokes.. She has positive PPD.  CXR LLL nodular infx - resolved 04/2012  Home sleep test 07/2012 - showed desatn, but could not afford O2    Jan 2015 -admitted  asthma exacerbation, community-acquired pneumonia involving the right upper lobe and RSV bronchitis.  EGD in 03/2011 ([facility]) has shown peptic ulcer disease.  12/2013  barium esophagram - distal esophageal stricture.   stricture dilated ([place]) >> Restarted Singulair 04/2015   07/02/2016 Follow up : Asthma never smoker  Pt returns for 4 month follow up .  Pt states breathing is doing well and has no new complaints at this time.  No flare of cough or wheezing  No increased SABA use.  No steroids use >4 months .  Remains on [place] , Singular .  No chest pain, fever, n/v/d or hemoptysis  Remains on O2 2l/m At bedtime  Had HST in 2013 that was neg.  Unable to do spirometry today as machine is broken .  May have left knee surgery soon, waiting on date.    Past Medical History:  Diagnosis Date  . Anemia    "chronic"  . Angina   . Anxiety and depression   . Atrial fibrillation ([place])    h/o "AF w/frequent PVCs"  . Chronic back pain greater than 3 months duration    on chronic narcotics, treated at pain clinic  . Colon polyps    hyperplastic  . Coronary artery disease    Arrythmia, orthostatic hypotension, HLD, HTN; sees Dr. Einar Gip  . Difficult intubation    "TMJ & woke up when they were  still cutting on me"  . Dysrhythmia    sees Dr. Einar Gip and a cardiologist at [facility]  . Esophageal stricture   . Fatty liver   . Fibroids   . Fibromyalgia    "in my legs"  . GERD (gastroesophageal reflux disease)    hx hiatal hernia, stricture and gastric ulcer  . Headache(784.0)   . Heart murmur   . Hiatal hernia   . History of migraines    "dx'd when I was in my teens"  . Hyperlipemia   . Hypertension   . Mental disorder   . Mild episode of recurrent major depressive disorder ([place]) 12/06/2015  . Myocardial infarction [facility]) 1980's & 1990;   sees Dr. Einar Gip  . Pneumonia   . PONV (postoperative nausea and vomiting)   . Recurrent upper respiratory infection (URI)   . Shortness of breath 11/20/11   "all the time", sees pulmonlogy, ? asthma  . Stenotic cervical os   . Stomach ulcer    "3 small; found in 05/2011"  . TMJ (dislocation of temporomandibular joint)   . Tuberculosis    + TB SKIN TEST  . Type 2 diabetes mellitus without complication, without long-term current use of insulin ([place]) 12/06/2015   Current Outpatient Prescriptions on File Prior to Visit  Medication  Sig Dispense Refill  . albuterol (PROVENTIL HFA;VENTOLIN HFA) 108 (90 BASE) MCG/ACT inhaler Inhale 2 puffs into the lungs every 4 (four) hours as needed. Shortness of breath 1 Inhaler 3  . albuterol (PROVENTIL) (2.5 MG/3ML) 0.083% nebulizer solution Take 3 mLs (2.5 mg total) by nebulization every 6 (six) hours as needed for wheezing or shortness of breath. 75 mL 12  . alclomethasone (ACLOVATE) 0.05 % cream Apply topically 2 (two) times daily. 60 g 2  . aspirin 81 MG EC tablet Take 81 mg by mouth daily. Swallow whole.     Marland Kitchen atorvastatin (LIPITOR) 40 MG tablet TAKE 1 TABLET BY MOUTH EVERY DAY 90 tablet 1  . Calcium Carbonate-Vitamin D (CALCIUM + D PO) Take 1 tablet by mouth 3 (three) times daily.      . Cinnamon 500 MG capsule Take 1,000 mg by mouth 2 (two) times daily.     Marland Kitchen esomeprazole (NEXIUM) 40 MG capsule TAKE 1  CAPSULE (40 MG TOTAL) BY MOUTH 2 (TWO) TIMES DAILY. 60 capsule 11  . Eszopiclone (ESZOPICLONE) 3 MG TABS Take 3 mg by mouth at bedtime as needed.    . fludrocortisone (FLORINEF) 0.1 MG tablet TAKE 1 TABLET BY MOUTH TWICE A DAY (Patient taking differently: 1 tablet at bedtime) 60 tablet 5  . isosorbide mononitrate (IMDUR) 60 MG 24 hr tablet Take 1 tablet (60 mg total) by mouth daily. 90 tablet 1  . L-Methylfolate (DEPLIN) 7.5 MG TABS Take 7.5 mg by mouth daily.      Marland Kitchen lidocaine (LIDODERM) 5 % Place 2 patches onto the skin as needed. Remove & Discard patch within 12 hours or as directed by MD    . LYRICA 75 MG capsule     . meloxicam (MOBIC) 7.5 MG tablet TAKE 1 TABLET (7.5 MG TOTAL) BY MOUTH DAILY AS NEEDED FOR PAIN. 30 tablet 0  . metoprolol (LOPRESSOR) 50 MG tablet TAKE 1 TABLET TWICE A DAY 180 tablet 1  . mometasone-formoterol (DULERA) 200-5 MCG/ACT AERO Inhale 2 puffs into the lungs 2 (two) times daily. 1 Inhaler 4  . montelukast (SINGULAIR) 10 MG tablet Take 1 tablet (10 mg total) by mouth at bedtime. 90 tablet 2  . Multiple Vitamin (MULITIVITAMIN WITH MINERALS) TABS Take 1 tablet by mouth daily.      . nitroGLYCERIN (NITROSTAT) 0.3 MG SL tablet Place 0.3 mg under the tongue every 5 (five) minutes as needed for chest pain (as needed).    . NONFORMULARY OR COMPOUNDED ITEM Estradiol .02% 1 ML Prefilled Applicator Sig: apply vaginally twice a week #90 Day Supply with 4 refills 1 each 4  . OXYCODONE HCL PO Take 30 mg by mouth every 4 (four) hours as needed.    . potassium chloride SA (KLOR-CON M20) 20 MEQ tablet TAKE 1 TABLET (20 MEQ TOTAL) BY MOUTH DAILY. 30 tablet 3  . progesterone (PROMETRIUM) 200 MG capsule Take one daily first 12 days of the month 36 capsule 0  . promethazine (PHENERGAN) 25 MG tablet TAKE 1 TABLET BY MOUTH AT BEDTIME AS NEEDED FOR NAUSEA AND VOMITING 30 tablet 1  . sucralfate (CARAFATE) 1 GM/10ML suspension Take 10 mLs (1 g total) by mouth 4 (four) times daily -  with meals  and at bedtime. Reported on 01/05/2016 420 mL 6  . [DISCONTINUED] Potassium Chloride ER 20 MEQ TBCR Take 1 tablet by mouth daily. 30 tablet 3   No current facility-administered medications on file prior to visit.        Review  of Systems neg for any significant sore throat, dysphagia, itching, sneezing, nasal congestion or excess/ purulent secretions, fever, chills, sweats, unintended wt loss, pleuritic or exertional cp, hempoptysis, orthopnea pnd or change in chronic leg swelling. Also denies presyncope, palpitations, heartburn, abdominal pain, nausea, vomiting, diarrhea or change in bowel or urinary habits, dysuria,hematuria, rash, arthralgias, visual complaints, headache, numbness weakness or ataxia.     Objective:   Physical Exam Vitals:   07/02/16 1507  BP: 120/76  Pulse: 60  Temp: 98.3 F (36.8 C)  TempSrc: Oral  SpO2: 94%  Weight: 223 lb (101.2 kg)  Height: 5\' 6"  (1.676 m)  Body mass index is 35.99 kg/m.   Gen. Pleasant, obese, in no distress ENT - no lesions, no post nasal drip Neck: No JVD, no thyromegaly, no carotid bruits Lungs: no use of accessory muscles, no dullness to percussion,  No wheezing  Cardiovascular: Rhythm regular, heart sounds  normal, no murmurs or gallops, no peripheral edema Musculoskeletal: No deformities, no cyanosis or clubbing , no tremors   CXR 08/26/15 NAD   Giavanni Odonovan NP-C  [facility]  07/02/2016

## 2016-07-02 NOTE — Patient Instructions (Signed)
Continue on Dulera 2 puffs Twice daily   Continue on Singulair daily  Continue on oxygen 2l/m At bedtime   Follow up Dr. Elsworth Soho  In 4  months and As needed   Please contact office for sooner follow up if symptoms do not improve or worsen or seek emergency care

## 2016-07-02 NOTE — Assessment & Plan Note (Signed)
Cont on O2 At bedtime   

## 2016-07-03 ENCOUNTER — Telehealth: Payer: Self-pay | Admitting: Gynecology

## 2016-07-03 NOTE — Telephone Encounter (Signed)
07/03/16-Pt was advised today that her [employer] plan covers the sonohysterogram with 20% coins due on allowable of $332.40 or $66.48 day of test. JF has already done endo bx. [id number]

## 2016-07-08 ENCOUNTER — Other Ambulatory Visit: Payer: Self-pay | Admitting: Family Medicine

## 2016-07-09 ENCOUNTER — Encounter: Payer: Self-pay | Admitting: Family Medicine

## 2016-07-09 ENCOUNTER — Ambulatory Visit (INDEPENDENT_AMBULATORY_CARE_PROVIDER_SITE_OTHER): Payer: Medicare Other | Admitting: Family Medicine

## 2016-07-09 ENCOUNTER — Telehealth: Payer: Self-pay | Admitting: *Deleted

## 2016-07-09 VITALS — BP 118/78 | HR 70 | Temp 98.7°F | Ht 66.0 in | Wt 220.8 lb

## 2016-07-09 DIAGNOSIS — E119 Type 2 diabetes mellitus without complications: Secondary | ICD-10-CM

## 2016-07-09 DIAGNOSIS — G8929 Other chronic pain: Secondary | ICD-10-CM

## 2016-07-09 DIAGNOSIS — L255 Unspecified contact dermatitis due to plants, except food: Secondary | ICD-10-CM

## 2016-07-09 DIAGNOSIS — F330 Major depressive disorder, recurrent, mild: Secondary | ICD-10-CM | POA: Diagnosis not present

## 2016-07-09 DIAGNOSIS — E785 Hyperlipidemia, unspecified: Secondary | ICD-10-CM

## 2016-07-09 DIAGNOSIS — I2510 Atherosclerotic heart disease of native coronary artery without angina pectoris: Secondary | ICD-10-CM

## 2016-07-09 MED ORDER — TRIAMCINOLONE ACETONIDE 0.1 % EX CREA: 1.0000 "application " | TOPICAL_CREAM | Freq: Two times a day (BID) | CUTANEOUS | 1 refills | Status: DC

## 2016-07-09 NOTE — Patient Instructions (Signed)
BEFORE YOU LEAVE: -follow up: 4 months, will plan labs then  For the rash: -use the steroid cream twice daily for 1-2 weeks, follow up if worsening or persists  For the chronic pain and depression: -follow up with your specialist and consider weaning off medications that do not seem to be helping -use the mobic not more then 2 days per week -consider seeing psychiatrist to assist in weaning meds and perhaps help with treating the chronic pain and depression   We recommend the following healthy lifestyle: 1) Small portions - eat off of salad plate instead of dinner plate 2) Eat a healthy clean diet with avoidance of (less then 1 serving per week) processed foods, sweetened drinks, white starches, red meat, fast foods and sweets and consisting of: * 5-9 servings per day of fresh or frozen fruits and vegetables (not corn or potatoes, not dried or canned) *nuts and seeds, beans *olives and olive oil *small portions of lean meats such as fish and white chicken  *small portions of whole grains 3)Get at least 150 minutes of sweaty aerobic exercise per week as tolerated 4)reduce stress - counseling, meditation, relaxation to balance other aspects of your life

## 2016-07-09 NOTE — Telephone Encounter (Signed)
Error

## 2016-07-09 NOTE — Progress Notes (Signed)
Pre visit review using our clinic review tool, if applicable. No additional management support is needed unless otherwise documented below in the visit note. 

## 2016-07-09 NOTE — Progress Notes (Signed)
HPI:  Shelby Anderson is a pleasant 60 yo with a very complicated PMH here for a follow up visit. She has no showed several visits, including her last visit here.  Rash: -arms and legs after yard work -for several days -reports had blisters and the busted -using calamine, alcohol and hairdryer? -none on face or mucus membranes  Diabetes Mellitus: -a recent transfer to me, appears has likely been borderline/mild diabetic for some time -referred to diabetes educator and she is working on dietary changes for this -no regular exercise, but she is active taking care of grandchildren -hx CAD (sees cardiologist), HLD, HTN, orthostattic hypotension - on statin and asa, not on acei - has asthma  GERD: -hx bad reflux, esophageal stricture s/p dilation and gastric ulcer -sees Dr. Henrene Pastor for this - seen recently per review of chart -on nexium 40mg  bid, carafate, prn phenergan  CAD hx MI x2 per her report/Arrythmia/HTN/HLD/orthostatic hypotension/presyncope: -seeing several cardiologists -meds: asa, metoprolol, fish oil, isosorbide, florinef, decreased, K+, lipitor -denies: CP, SOB, palpitations  Asthma/Allergies: -sees pulmonologist for this -meds: singulair -on review of chart recently had exacerbation and treated with steroids and abx and doing better  Insomnia: -uses benydrl occ for this -eszopiclone on med list from ? [facility] -hx of low O2 at night  Chronic back pain/Knee pain: -hx DDD and neuropathy, OA knees -reports going to have knee surgery -seeing [place] pain management specialist and referred to ortho -on daily oxycodone 30mg  3 times daily, lyrica- doesn't think any of them help; reports pain doctor doesn't want her to stop them, on methadone in the past and opana - reports reacted to them -no anemia on labs -aware of risks with medications, adamant about continuing mobic as feels is only medication that helps  MDD: -reports on deplin from pain management  for this, does not feel helps -declined other treatments offered here -Chronic symptoms: mildly constant depressed mood and hopeless feelings at times, no SI, panic or manic symptoms  HRT: -on prometrium with her gynecologist, Dr. Toney Rakes  ROS: See pertinent positives and negatives per HPI.  Past Medical History:  Diagnosis Date  . Anemia    "chronic"  . Angina   . Anxiety and depression   . Atrial fibrillation ([place])    h/o "AF w/frequent PVCs"  . Chronic back pain greater than 3 months duration    on chronic narcotics, treated at pain clinic  . Colon polyps    hyperplastic  . Coronary artery disease    Arrythmia, orthostatic hypotension, HLD, HTN; sees Dr. Einar Gip  . Difficult intubation    "TMJ & woke up when they were still cutting on me"  . Dysrhythmia    sees Dr. Einar Gip and a cardiologist at [facility]  . Esophageal stricture   . Fatty liver   . Fibroids   . Fibromyalgia    "in my legs"  . GERD (gastroesophageal reflux disease)    hx hiatal hernia, stricture and gastric ulcer  . Headache(784.0)   . Heart murmur   . Hiatal hernia   . History of migraines    "dx'd when I was in my teens"  . Hyperlipemia   . Hypertension   . Mental disorder   . Mild episode of recurrent major depressive disorder ([place]) 12/06/2015  . Myocardial infarction [facility]) 1980's & 1990;   sees Dr. Einar Gip  . Pneumonia   . PONV (postoperative nausea and vomiting)   . Recurrent upper respiratory infection (URI)   . Shortness of breath  11/20/11   "all the time", sees pulmonlogy, ? asthma  . Stenotic cervical os   . Stomach ulcer    "3 small; found in 05/2011"  . TMJ (dislocation of temporomandibular joint)   . Tuberculosis    + TB SKIN TEST  . Type 2 diabetes mellitus without complication, without long-term current use of insulin ([place]) 12/06/2015    Past Surgical History:  Procedure Laterality Date  . ACHILLES TENDON REPAIR  1970's   left ankle  . ARTHROSCOPIC REPAIR ACL     left knee cap   . CARDIAC CATHETERIZATION     loop recorder  . CARPAL TUNNEL RELEASE  unknown   left hand  . post ganglionectomy  1970's   "for migraine headaches"  . pouch string  [id number]   "did this 3 times (once w/each pregnancy)"  . TUBAL LIGATION  1980's    Family History  Problem Relation Age of Onset  . Malignant hyperthermia Father   . Hypertension Father   . Heart disease Father   . Diabetes Father   . Cancer Father     skin  . Hypertension Mother   . Heart disease Mother   . Cancer Sister     CERVICAL  . Hypertension Sister   . Cancer Brother     MELANOMA  . Heart disease Maternal Grandmother   . Heart disease Maternal Grandfather   . Cancer Paternal Grandmother     ?   Marland Kitchen Heart disease Paternal Grandmother   . Heart disease Paternal Grandfather   . Cancer Brother     LUNG  . Diabetes Sister   . Hypertension Sister   . Heart disease Sister   . Cancer Sister   . Cancer Brother   . Anesthesia problems Neg Hx   . Hypotension Neg Hx   . Pseudochol deficiency Neg Hx     Social History   Social History  . Marital status: Married    Spouse name: N/A  . Number of children: 3  . Years of education: N/A   Occupational History  . Retired Therapist, sports    Social History Main Topics  . Smoking status: Never Smoker  . Smokeless tobacco: Never Used  . Alcohol use No  . Drug use: No  . Sexual activity: Yes    Birth control/ protection: Surgical   Other Topics Concern  . None   Social History Narrative  . None     Current Outpatient Prescriptions:  .  albuterol (PROVENTIL HFA;VENTOLIN HFA) 108 (90 BASE) MCG/ACT inhaler, Inhale 2 puffs into the lungs every 4 (four) hours as needed. Shortness of breath, Disp: 1 Inhaler, Rfl: 3 .  albuterol (PROVENTIL) (2.5 MG/3ML) 0.083% nebulizer solution, Take 3 mLs (2.5 mg total) by nebulization every 6 (six) hours as needed for wheezing or shortness of breath., Disp: 75 mL, Rfl: 12 .  alclomethasone (ACLOVATE) 0.05 % cream, Apply topically  2 (two) times daily., Disp: 60 g, Rfl: 2 .  aspirin 81 MG EC tablet, Take 81 mg by mouth daily. Swallow whole. , Disp: , Rfl:  .  atorvastatin (LIPITOR) 40 MG tablet, TAKE 1 TABLET BY MOUTH EVERY DAY, Disp: 90 tablet, Rfl: 1 .  Calcium Carbonate-Vitamin D (CALCIUM + D PO), Take 1 tablet by mouth 3 (three) times daily.  , Disp: , Rfl:  .  Cinnamon 500 MG capsule, Take 1,000 mg by mouth 2 (two) times daily. , Disp: , Rfl:  .  esomeprazole (NEXIUM) 40 MG capsule, TAKE 1  CAPSULE (40 MG TOTAL) BY MOUTH 2 (TWO) TIMES DAILY., Disp: 60 capsule, Rfl: 11 .  Eszopiclone (ESZOPICLONE) 3 MG TABS, Take 3 mg by mouth at bedtime as needed., Disp: , Rfl:  .  fludrocortisone (FLORINEF) 0.1 MG tablet, TAKE 1 TABLET BY MOUTH TWICE A DAY (Patient taking differently: 1 tablet at bedtime), Disp: 60 tablet, Rfl: 5 .  isosorbide mononitrate (IMDUR) 60 MG 24 hr tablet, Take 1 tablet (60 mg total) by mouth daily., Disp: 90 tablet, Rfl: 1 .  L-Methylfolate (DEPLIN) 7.5 MG TABS, Take 7.5 mg by mouth daily.  , Disp: , Rfl:  .  lidocaine (LIDODERM) 5 %, Place 2 patches onto the skin as needed. Remove & Discard patch within 12 hours or as directed by MD, Disp: , Rfl:  .  LYRICA 75 MG capsule, , Disp: , Rfl:  .  meloxicam (MOBIC) 7.5 MG tablet, TAKE 1 TABLET (7.5 MG TOTAL) BY MOUTH DAILY AS NEEDED FOR PAIN., Disp: 30 tablet, Rfl: 0 .  metoprolol (LOPRESSOR) 50 MG tablet, TAKE 1 TABLET TWICE A DAY, Disp: 180 tablet, Rfl: 1 .  mometasone-formoterol (DULERA) 200-5 MCG/ACT AERO, Inhale 2 puffs into the lungs 2 (two) times daily., Disp: 1 Inhaler, Rfl: 4 .  montelukast (SINGULAIR) 10 MG tablet, Take 1 tablet (10 mg total) by mouth at bedtime., Disp: 90 tablet, Rfl: 2 .  Multiple Vitamin (MULITIVITAMIN WITH MINERALS) TABS, Take 1 tablet by mouth daily.  , Disp: , Rfl:  .  nitroGLYCERIN (NITROSTAT) 0.3 MG SL tablet, Place 0.3 mg under the tongue every 5 (five) minutes as needed for chest pain (as needed)., Disp: , Rfl:  .  NONFORMULARY  OR COMPOUNDED ITEM, Estradiol .02% 1 ML Prefilled Applicator Sig: apply vaginally twice a week #90 Day Supply with 4 refills, Disp: 1 each, Rfl: 4 .  OXYCODONE HCL PO, Take 30 mg by mouth every 4 (four) hours as needed., Disp: , Rfl:  .  potassium chloride SA (KLOR-CON M20) 20 MEQ tablet, TAKE 1 TABLET (20 MEQ TOTAL) BY MOUTH DAILY., Disp: 30 tablet, Rfl: 3 .  progesterone (PROMETRIUM) 200 MG capsule, Take one daily first 12 days of the month, Disp: 36 capsule, Rfl: 0 .  promethazine (PHENERGAN) 25 MG tablet, TAKE 1 TABLET BY MOUTH AT BEDTIME AS NEEDED FOR NAUSEA AND VOMITING, Disp: 30 tablet, Rfl: 1 .  sucralfate (CARAFATE) 1 GM/10ML suspension, Take 10 mLs (1 g total) by mouth 4 (four) times daily -  with meals and at bedtime. Reported on 01/05/2016, Disp: 420 mL, Rfl: 6 .  triamcinolone cream (KENALOG) 0.1 %, Apply 1 application topically 2 (two) times daily., Disp: 80 g, Rfl: 1  EXAM:  Vitals:   07/09/16 1531  BP: 118/78  Pulse: 70  Temp: 98.7 F (37.1 C)    Body mass index is 35.64 kg/m.  GENERAL: vitals reviewed and listed above, alert, oriented, appears well hydrated and in no acute distress  HEENT: atraumatic, conjunttiva clear, no obvious abnormalities on inspection of external nose and ears  NECK: no obvious masses on inspection  LUNGS: clear to auscultation bilaterally, no wheezes, rales or rhonchi, good air movement  CV: HRRR, no peripheral edema  MS: moves all extremities without noticeable abnormality  PSYCH: pleasant and cooperative, no obvious depression or anxiety  ASSESSMENT AND PLAN:  Discussed the following assessment and plan:  Toxicodendron dermatitis -opted for topical steroids as she does not want to mess up blood sugars as surgeon told her would not do surgery if elevated -  skin care recs  Type 2 diabetes mellitus without complication, without long-term current use of insulin ([place]) -lifestyle recs -labs next visit  Chronic pain Mild episode of  recurrent major depressive disorder ([place]) -have advised working with pain management and psychiatry to help come off meds that are not helping and perhaps try cymbalta? -she is very upset about her pain every time she comes in, but does not seem interested in trying different things -she was on high dose mobic, which I do not think is a good idea with her PMH - have refilled some for rare use  Hyperlipemia Coronary artery disease involving native coronary artery of native heart without angina pectoris -sees cardiologist, will need cardiology eval for recs prior to surgery  -Patient advised to return or notify a doctor immediately if symptoms worsen or persist or new concerns arise.  Patient Instructions  BEFORE YOU LEAVE: -follow up: 4 months, will plan labs then  For the rash: -use the steroid cream twice daily for 1-2 weeks, follow up if worsening or persists  For the chronic pain and depression: -follow up with your specialist and consider weaning off medications that do not seem to be helping -use the mobic not more then 2 days per week -consider seeing psychiatrist to assist in weaning meds and perhaps help with treating the chronic pain and depression   We recommend the following healthy lifestyle: 1) Small portions - eat off of salad plate instead of dinner plate 2) Eat a healthy clean diet with avoidance of (less then 1 serving per week) processed foods, sweetened drinks, white starches, red meat, fast foods and sweets and consisting of: * 5-9 servings per day of fresh or frozen fruits and vegetables (not corn or potatoes, not dried or canned) *nuts and seeds, beans *olives and olive oil *small portions of lean meats such as fish and white chicken  *small portions of whole grains 3)Get at least 150 minutes of sweaty aerobic exercise per week as tolerated 4)reduce stress - counseling, meditation, relaxation to balance other aspects of your life     Colin Benton R.,  DO

## 2016-07-10 ENCOUNTER — Ambulatory Visit
Admission: RE | Admit: 2016-07-10 | Discharge: 2016-07-10 | Disposition: A | Discharge status: Home / Self Care | Payer: Medicare Other | Source: Ambulatory Visit | Attending: Gynecology | Admitting: Gynecology

## 2016-07-10 DIAGNOSIS — Z1231 Encounter for screening mammogram for malignant neoplasm of breast: Secondary | ICD-10-CM | POA: Diagnosis not present

## 2016-07-11 ENCOUNTER — Ambulatory Visit (INDEPENDENT_AMBULATORY_CARE_PROVIDER_SITE_OTHER): Payer: Medicare Other

## 2016-07-11 ENCOUNTER — Ambulatory Visit (INDEPENDENT_AMBULATORY_CARE_PROVIDER_SITE_OTHER): Payer: Medicare Other | Admitting: Gynecology

## 2016-07-11 DIAGNOSIS — N950 Postmenopausal bleeding: Secondary | ICD-10-CM

## 2016-07-11 DIAGNOSIS — R938 Abnormal findings on diagnostic imaging of other specified body structures: Secondary | ICD-10-CM | POA: Diagnosis not present

## 2016-07-11 DIAGNOSIS — Z79890 Hormone replacement therapy (postmenopausal): Secondary | ICD-10-CM

## 2016-07-11 DIAGNOSIS — R9389 Abnormal findings on diagnostic imaging of other specified body structures: Secondary | ICD-10-CM

## 2016-07-11 DIAGNOSIS — D251 Intramural leiomyoma of uterus: Secondary | ICD-10-CM

## 2016-07-11 NOTE — Patient Instructions (Signed)
Hysterectomy Information  A hysterectomy is a surgery in which your uterus is removed. This surgery may be done to treat various medical problems. After the surgery, you will no longer have menstrual periods. The surgery will also make you unable to become pregnant (sterile). The fallopian tubes and ovaries can be removed (bilateral salpingo-oophorectomy) during this surgery as well.  REASONS FOR A HYSTERECTOMY  Persistent, abnormal bleeding.  Lasting (chronic) pelvic pain or infection.  The lining of the uterus (endometrium) starts growing outside the uterus (endometriosis).  The endometrium starts growing in the muscle of the uterus (adenomyosis).  The uterus falls down into the vagina (pelvic organ prolapse).  Noncancerous growths in the uterus (uterine fibroids) that cause symptoms.  Precancerous cells.  Cervical cancer or uterine cancer. TYPES OF HYSTERECTOMIES  Supracervical hysterectomy--In this type, the top part of the uterus is removed, but not the cervix.  Total hysterectomy--The uterus and cervix are removed.  Radical hysterectomy--The uterus, the cervix, and the fibrous tissue that holds the uterus in place in the pelvis (parametrium) are removed. WAYS A HYSTERECTOMY CAN BE PERFORMED  Abdominal hysterectomy--A large surgical cut (incision) is made in the abdomen. The uterus is removed through this incision.  Vaginal hysterectomy--An incision is made in the vagina. The uterus is removed through this incision. There are no abdominal incisions.  Conventional laparoscopic hysterectomy--Three or four small incisions are made in the abdomen. A thin, lighted tube with a camera (laparoscope) is inserted into one of the incisions. Other tools are put through the other incisions. The uterus is cut into small pieces. The small pieces are removed through the incisions, or they are removed through the vagina.  Laparoscopically assisted vaginal hysterectomy (LAVH)--Three or four  small incisions are made in the abdomen. Part of the surgery is performed laparoscopically and part vaginally. The uterus is removed through the vagina.  Robot-assisted laparoscopic hysterectomy--A laparoscope and other tools are inserted into 3 or 4 small incisions in the abdomen. A computer-controlled device is used to give the surgeon a 3D image and to help control the surgical instruments. This allows for more precise movements of surgical instruments. The uterus is cut into small pieces and removed through the incisions or removed through the vagina. RISKS AND COMPLICATIONS  Possible complications associated with this procedure include:  Bleeding and risk of blood transfusion. Tell your health care provider if you do not want to receive any blood products.  Blood clots in the legs or lung.  Infection.  Injury to surrounding organs.  Problems or side effects related to anesthesia.  Conversion to an abdominal hysterectomy from one of the other techniques. WHAT TO EXPECT AFTER A HYSTERECTOMY  You will be given pain medicine.  You will need to have someone with you for the first 3-5 days after you go home.  You will need to follow up with your surgeon in 2-4 weeks after surgery to evaluate your progress.  You may have early menopause symptoms such as hot flashes, night sweats, and insomnia.  If you had a hysterectomy for a problem that was not cancer or not a condition that could lead to cancer, then you no longer need Pap tests. However, even if you no longer need a Pap test, a regular exam is a good idea to make sure no other problems are starting.   This information is not intended to replace advice given to you by your health care provider. Make sure you discuss any questions you have with your health care  provider.   Document Released: 05/15/2001 Document Revised: 09/09/2013 Document Reviewed: 07/27/2013 Elsevier Interactive Patient Education 2016 Elsevier Inc. Uterine  Fibroids Uterine fibroids are tissue masses (tumors) that can develop in the womb (uterus). They are also called leiomyomas. This type of tumor is not cancerous (benign) and does not spread to other parts of the body outside of the pelvic area, which is between the hip bones. Occasionally, fibroids may develop in the fallopian tubes, in the cervix, or on the support structures (ligaments) that surround the uterus. You can have one or many fibroids. Fibroids can vary in size, weight, and where they grow in the uterus. Some can become quite large. Most fibroids do not require medical treatment. CAUSES A fibroid can develop when a single uterine cell keeps growing (replicating). Most cells in the human body have a control mechanism that keeps them from replicating without control. SIGNS AND SYMPTOMS Symptoms may include:   Heavy bleeding during your period.  Bleeding or spotting between periods.  Pelvic pain and pressure.  Bladder problems, such as needing to urinate more often (urinary frequency) or urgently.  Inability to reproduce offspring (infertility).  Miscarriages. DIAGNOSIS Uterine fibroids are diagnosed through a physical exam. Your health care provider may feel the lumpy tumors during a pelvic exam. Ultrasonography and an MRI may be done to determine the size, location, and number of fibroids. TREATMENT Treatment may include:  Watchful waiting. This involves getting the fibroid checked by your health care provider to see if it grows or shrinks. Follow your health care provider's recommendations for how often to have this checked.  Hormone medicines. These can be taken by mouth or given through an intrauterine device (IUD).  Surgery.  Removing the fibroids (myomectomy) or the uterus (hysterectomy).  Removing blood supply to the fibroids (uterine artery embolization). If fibroids interfere with your fertility and you want to become pregnant, your health care provider may  recommend having the fibroids removed.  HOME CARE INSTRUCTIONS  Keep all follow-up visits as directed by your health care provider. This is important.  Take medicines only as directed by your health care provider.  If you were prescribed a hormone treatment, take the hormone medicines exactly as directed.  Do not take aspirin, because it can cause bleeding.  Ask your health care provider about taking iron pills and increasing the amount of dark green, leafy vegetables in your diet. These actions can help to boost your blood iron levels, which may be affected by heavy menstrual bleeding.  Pay close attention to your period and tell your health care provider about any changes, such as:  Increased blood flow that requires you to use more pads or tampons than usual per month.  A change in the number of days that your period lasts per month.  A change in symptoms that are associated with your period, such as abdominal cramping or back pain. SEEK MEDICAL CARE IF:  You have pelvic pain, back pain, or abdominal cramps that cannot be controlled with medicines.  You have an increase in bleeding between and during periods.  You soak tampons or pads in a half hour or less.  You feel lightheaded, extra tired, or weak. SEEK IMMEDIATE MEDICAL CARE IF:  You faint.  You have a sudden increase in pelvic pain.   This information is not intended to replace advice given to you by your health care provider. Make sure you discuss any questions you have with your health care provider.   Document Released: 11/16/2000  Document Revised: 12/10/2014 Document Reviewed: 05/18/2014 Elsevier Interactive Patient Education [employer].

## 2016-07-11 NOTE — Progress Notes (Signed)
   Patient is a 60 year old who is here for sonohysterogram as a result of her postmenopausal bleeding and endometrial thickness. We also did an endometrial biopsy on last office visit. Her history is as follows:  Patient was originally seen in the office on July 11 for her annual exam. Patient with past history of leiomyomatous uteri and endometrial thickness seen on ultrasound. Review of her record indicated the past she was using vaginal estrogen twice a week and taking Prometrium for 12 days of the month and had had some spotting and in February 2016 had an endometrial biopsy which demonstrated the following:  Diagnosis Endometrium, biopsy, uterus - DISAGGREGATED FRAGMENTS OF ENDOMETRIAL GLANDS AND STROMA WITH FEATURES OF BREAKDOWN. - NEGATIVE FOR HYPERPLASIA OR MALIGNANCY. - BENIGN ENDOCERVICAL MUCOSA. - SQUAMOUS EPITHELIUM, NEGATIVE FOR INTRAEPITHELIAL LESION OR MALIGNANCY  Sonohysterogram also at that time did not demonstrate any intracavitary defect.  Her ultrasound had demonstrated the following: Uterus measures 6.2 x 4.4 x 4.6 cm with endometrial stripe of 5.2 mm. Patient had 2 small intramural myomas the largest one measuring 21 x 23 mm. Right and left ovary otherwise normal. No apparent adnexal masses and no fluid in the cul-de-sac.  Her follow-up ultrasound on July 26 demonstrated following: Uterus measures 6.0 x 4.3 x 4.1 intramural/subserous fibroid 18 x 17 mm, 17 x 16 mm, wall of endometrium left 10 x 5 mm. The within the endometrium on sagittal image, there appeared to be any encroachment of a fibroid. No adnexal masses.  Endometrial biopsy done at that office visit demonstrated the following: Fragmented benign endometrium  Sonohysterogram today: The cervix was cleansed with Betadine solution and sterile catheter was introduced into the uterine cavity left fibroid intramural myoma adjacent to the wall endometrium measuring 12 x 10 mm. Endometrial stripe was  2.7 mm clear fluid. Otherwise normal ovaries on last exam.  Assessment/plan: Patient was instructed to discontinue the vaginal estrogen. She'll continue the Prometrium 200 mg for first 12 days a month. She'll maintain a calendar of any event where she bleeds she'll return back to the office in December for follow-up sonohysterogram to see the fibroid has stopped growing cc estrogen was stopped and the see if she has any further bleeding. If she does continue to have any form of bleeding being off of the estrogen it may be attributed to the small fibroid impinging on the uterine cavity was AB a prior. At that point we discussed the only alternative would be to proceed with a vaginal hysterectomy with bilateral salpingo-oophorectomy. Literature information was provided. Additional 15 minutes was spent discussing findings and recommendation and treatment.

## 2016-07-17 DIAGNOSIS — E119 Type 2 diabetes mellitus without complications: Secondary | ICD-10-CM | POA: Diagnosis not present

## 2016-07-17 DIAGNOSIS — H2513 Age-related nuclear cataract, bilateral: Secondary | ICD-10-CM | POA: Diagnosis not present

## 2016-07-17 DIAGNOSIS — D3132 Benign neoplasm of left choroid: Secondary | ICD-10-CM | POA: Diagnosis not present

## 2016-07-17 LAB — HM DIABETES EYE EXAM

## 2016-07-18 DIAGNOSIS — M47816 Spondylosis without myelopathy or radiculopathy, lumbar region: Secondary | ICD-10-CM | POA: Diagnosis not present

## 2016-07-18 DIAGNOSIS — M179 Osteoarthritis of knee, unspecified: Secondary | ICD-10-CM | POA: Diagnosis not present

## 2016-07-18 DIAGNOSIS — G894 Chronic pain syndrome: Secondary | ICD-10-CM | POA: Diagnosis not present

## 2016-07-18 DIAGNOSIS — Z79891 Long term (current) use of opiate analgesic: Secondary | ICD-10-CM | POA: Diagnosis not present

## 2016-07-19 ENCOUNTER — Encounter: Payer: Self-pay | Admitting: Family Medicine

## 2016-07-19 DIAGNOSIS — Z79891 Long term (current) use of opiate analgesic: Secondary | ICD-10-CM | POA: Diagnosis not present

## 2016-07-23 DIAGNOSIS — J45909 Unspecified asthma, uncomplicated: Secondary | ICD-10-CM | POA: Diagnosis not present

## 2016-07-26 DIAGNOSIS — D1801 Hemangioma of skin and subcutaneous tissue: Secondary | ICD-10-CM | POA: Diagnosis not present

## 2016-07-26 DIAGNOSIS — L218 Other seborrheic dermatitis: Secondary | ICD-10-CM | POA: Diagnosis not present

## 2016-07-27 ENCOUNTER — Telehealth: Payer: Self-pay | Admitting: Pulmonary Disease

## 2016-07-27 NOTE — Telephone Encounter (Signed)
Pt has an appt with RA on 10/15/16---pt stated that she needs to come in for this spirometry for her surgery---appt scheduled on 8/30 with TP and pt will need spirometry done as well.

## 2016-08-01 ENCOUNTER — Ambulatory Visit (INDEPENDENT_AMBULATORY_CARE_PROVIDER_SITE_OTHER)
Admission: RE | Admit: 2016-08-01 | Discharge: 2016-08-01 | Disposition: A | Discharge status: Home / Self Care | Payer: Medicare Other | Source: Ambulatory Visit | Attending: Adult Health | Admitting: Adult Health

## 2016-08-01 ENCOUNTER — Ambulatory Visit (INDEPENDENT_AMBULATORY_CARE_PROVIDER_SITE_OTHER): Payer: Medicare Other | Admitting: Adult Health

## 2016-08-01 ENCOUNTER — Encounter: Payer: Self-pay | Admitting: Adult Health

## 2016-08-01 VITALS — BP 116/78 | HR 57 | Temp 98.3°F | Ht 67.0 in | Wt 218.0 lb

## 2016-08-01 DIAGNOSIS — J4540 Moderate persistent asthma, uncomplicated: Secondary | ICD-10-CM

## 2016-08-01 DIAGNOSIS — R0600 Dyspnea, unspecified: Secondary | ICD-10-CM | POA: Diagnosis not present

## 2016-08-01 DIAGNOSIS — Z23 Encounter for immunization: Secondary | ICD-10-CM | POA: Diagnosis not present

## 2016-08-01 DIAGNOSIS — Z01818 Encounter for other preprocedural examination: Secondary | ICD-10-CM | POA: Diagnosis not present

## 2016-08-01 DIAGNOSIS — J45909 Unspecified asthma, uncomplicated: Secondary | ICD-10-CM | POA: Diagnosis not present

## 2016-08-01 NOTE — Assessment & Plan Note (Signed)
Well controlled on current regimen with no recent flare .  Regarding Pre-op pulmonary clearance she is approved with a Low/Mod risk . Reviewed risk from pulmonary standpoint with patient.  Advised to take her [facility] prior to surgery .  Use oxygen at At bedtime  While in hospital .    Major Pulmonary risks identified in the multifactorial risk analysis are but not limited to a) pneumonia; b) recurrent intubation risk; c) prolonged or recurrent acute respiratory failure needing mechanical ventilation; d) prolonged hospitalization; e) DVT/Pulmonary embolism; f) Acute Pulmonary edema  Recommend 1. Short duration of surgery as much as possible and avoid paralytic if possible 2. Pulmonary consultation if needed.  3. DVT prophylaxis 4. Aggressive pulmonary toilet with o2, bronchodilatation, and incentive spirometry and early ambulation   Plan  Patient Instructions  Approved for pulmonary surgical clearance.  Chest xray today .  Flu shot today .  Continue on Dulera 2 puffs Twice daily   Continue on Singulair daily  Continue on oxygen 2l/m At bedtime   Follow up Dr. Elsworth Soho  In 4  months and As needed   Please contact office for sooner follow up if symptoms do not improve or worsen or seek emergency care

## 2016-08-01 NOTE — Patient Instructions (Addendum)
Approved for pulmonary surgical clearance.  Chest xray today .  Flu shot today .  Continue on Dulera 2 puffs Twice daily   Continue on Singulair daily  Continue on oxygen 2l/m At bedtime   Follow up Dr. Elsworth Soho  In 4  months and As needed   Please contact office for sooner follow up if symptoms do not improve or worsen or seek emergency care

## 2016-08-01 NOTE — Assessment & Plan Note (Signed)
Well controlled on current regimen with no recent flare .  Regarding Pre-op pulmonary clearance she is approved with a Low/Mod risk . Reviewed risk from pulmonary standpoint with patient.  Advised to take her [facility] prior to surgery .  Use oxygen at At bedtime  While in hospital .    Major Pulmonary risks identified in the multifactorial risk analysis are but not limited to a) pneumonia; b) recurrent intubation risk; c) prolonged or recurrent acute respiratory failure needing mechanical ventilation; d) prolonged hospitalization; e) DVT/Pulmonary embolism; f) Acute Pulmonary edema  Recommend 1. Short duration of surgery as much as possible and avoid paralytic if possible 2. Pulmonary consultation if needed.  3. DVT prophylaxis 4. Aggressive pulmonary toilet with o2, bronchodilatation, and incentive spirometry and early ambulation   Plan  Patient Instructions  Approved for pulmonary surgical clearance.  Chest xray today .  Flu shot today .  Continue on Dulera 2 puffs Twice daily   Continue on Singulair daily  Continue on oxygen 2l/m At bedtime   Follow up Dr. Elsworth Soho  In 4  months and As needed   Please contact office for sooner follow up if symptoms do not improve or worsen or seek emergency care

## 2016-08-01 NOTE — Progress Notes (Signed)
Subjective:    Patient ID: Shelby Anderson, female    DOB: 28-Nov-1956, 60 y.o.   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  She was evaluated for CAD  at [facility] for syncope.& has a loop recorder - brady & int a - fibn   Significant tests/ events  Spirometry 12/2011 - no airway obstruction -No reversibility with bronchodilator , mod restriction Her husband smokes.. She has positive PPD.  CXR LLL nodular infx - resolved 04/2012  Home sleep test 07/2012 - showed desatn, but could not afford O2    Jan 2015 -admitted  asthma exacerbation, community-acquired pneumonia involving the right upper lobe and RSV bronchitis.  EGD in 03/2011 ([facility]) has shown peptic ulcer disease.  12/2013  barium esophagram - distal esophageal stricture.   stricture dilated ([place]) >> Restarted Singulair 04/2015   58/30/2017 Follow up : Asthma never smoker  Pt returns for 1 month follow up for pulmonary surgical clearance .  Pt states breathing is doing well and has no new complaints at this time.  No flare of cough or wheezing  No increased SABA use.  No steroids use >5 months .  Remains on [place] , Singular . Takes regular.  Remains on O2 2l/m At bedtime  Had HST in 2013 that was neg.  Plans for left total knee replacement with Dr. Alvan Dame, with [place] [facility]  She needs pre-op surgical clearance.  She is on disability for back issues .  She was seen by cardiology with approved clearance recently.  58pirometry today is unchanged with moderate restriction noted similar to 2016 .  No chest pain, fever, n/v/d or hemoptysis     Past Medical History:  Diagnosis Date  . Anemia    "chronic"  . Angina   . Anxiety and depression   . Atrial fibrillation ([place])    h/o "AF w/frequent PVCs"  . Chronic back pain greater than 3 months  duration    on chronic narcotics, treated at pain clinic  . Colon polyps    hyperplastic  . Coronary artery disease    Arrythmia, orthostatic hypotension, HLD, HTN; sees Dr. Einar Gip  . Difficult intubation    "TMJ & woke up when they were still cutting on me"  . Dysrhythmia    sees Dr. Einar Gip and a cardiologist at [facility]  . Esophageal stricture   . Fatty liver   . Fibroids   . Fibromyalgia    "in my legs"  . GERD (gastroesophageal reflux disease)    hx hiatal hernia, stricture and gastric ulcer  . Headache(784.0)   . Heart murmur   . Hiatal hernia   . History of migraines    "dx'd when I was in my teens"  . Hyperlipemia   . Hypertension   . Mental disorder   . Mild episode of recurrent major depressive disorder ([place]) 12/06/2015  . Myocardial infarction [facility]) 1980's & 1990;   sees Dr. Einar Gip  . Pneumonia   . PONV (postoperative nausea and vomiting)   . Recurrent upper respiratory infection (URI)   . Shortness of breath 11/20/11   "all the time", sees pulmonlogy, ? asthma  . Stenotic cervical os   . Stomach ulcer    "3 small; found in 05/2011"  . TMJ (dislocation of temporomandibular joint)   .  Tuberculosis    + TB SKIN TEST  . Type 2 diabetes mellitus without complication, without long-term current use of insulin ([place]) 12/06/2015   Current Outpatient Prescriptions on File Prior to Visit  Medication Sig Dispense Refill  . albuterol (PROVENTIL HFA;VENTOLIN HFA) 108 (90 BASE) MCG/ACT inhaler Inhale 2 puffs into the lungs every 4 (four) hours as needed. Shortness of breath 1 Inhaler 3  . albuterol (PROVENTIL) (2.5 MG/3ML) 0.083% nebulizer solution Take 3 mLs (2.5 mg total) by nebulization every 6 (six) hours as needed for wheezing or shortness of breath. 75 mL 12  . alclomethasone (ACLOVATE) 0.05 % cream Apply topically 2 (two) times daily. 60 g 2  . aspirin 81 MG EC tablet Take 81 mg by mouth daily. Swallow whole.     Marland Kitchen atorvastatin (LIPITOR) 40 MG tablet TAKE 1 TABLET BY MOUTH  EVERY DAY 90 tablet 1  . Calcium Carbonate-Vitamin D (CALCIUM + D PO) Take 1 tablet by mouth 3 (three) times daily.      . Cinnamon 500 MG capsule Take 1,000 mg by mouth 2 (two) times daily.     Marland Kitchen esomeprazole (NEXIUM) 40 MG capsule TAKE 1 CAPSULE (40 MG TOTAL) BY MOUTH 2 (TWO) TIMES DAILY. 60 capsule 11  . Eszopiclone (ESZOPICLONE) 3 MG TABS Take 3 mg by mouth at bedtime as needed.    . fludrocortisone (FLORINEF) 0.1 MG tablet TAKE 1 TABLET BY MOUTH TWICE A DAY (Patient taking differently: 1 tablet at bedtime) 60 tablet 5  . isosorbide mononitrate (IMDUR) 60 MG 24 hr tablet Take 1 tablet (60 mg total) by mouth daily. 90 tablet 1  . L-Methylfolate (DEPLIN) 7.5 MG TABS Take 7.5 mg by mouth daily.      Marland Kitchen lidocaine (LIDODERM) 5 % Place 2 patches onto the skin as needed. Remove & Discard patch within 12 hours or as directed by MD    . LYRICA 75 MG capsule     . meloxicam (MOBIC) 7.5 MG tablet TAKE 1 TABLET (7.5 MG TOTAL) BY MOUTH DAILY AS NEEDED FOR PAIN. 30 tablet 0  . metoprolol (LOPRESSOR) 50 MG tablet TAKE 1 TABLET TWICE A DAY 180 tablet 1  . mometasone-formoterol (DULERA) 200-5 MCG/ACT AERO Inhale 2 puffs into the lungs 2 (two) times daily. 1 Inhaler 4  . montelukast (SINGULAIR) 10 MG tablet Take 1 tablet (10 mg total) by mouth at bedtime. 90 tablet 2  . Multiple Vitamin (MULITIVITAMIN WITH MINERALS) TABS Take 1 tablet by mouth daily.      . nitroGLYCERIN (NITROSTAT) 0.3 MG SL tablet Place 0.3 mg under the tongue every 5 (five) minutes as needed for chest pain (as needed).    . NONFORMULARY OR COMPOUNDED ITEM Estradiol .02% 1 ML Prefilled Applicator Sig: apply vaginally twice a week #90 Day Supply with 4 refills 1 each 4  . potassium chloride SA (KLOR-CON M20) 20 MEQ tablet TAKE 1 TABLET (20 MEQ TOTAL) BY MOUTH DAILY. 30 tablet 3  . progesterone (PROMETRIUM) 200 MG capsule Take one daily first 12 days of the month 36 capsule 0  . promethazine (PHENERGAN) 25 MG tablet TAKE 1 TABLET BY MOUTH AT  BEDTIME AS NEEDED FOR NAUSEA AND VOMITING 30 tablet 1  . sucralfate (CARAFATE) 1 GM/10ML suspension Take 10 mLs (1 g total) by mouth 4 (four) times daily -  with meals and at bedtime. Reported on 01/05/2016 420 mL 6  . triamcinolone cream (KENALOG) 0.1 % Apply 1 application topically 2 (two) times daily. 80 g 1  .  OXYCODONE HCL PO Take 30 mg by mouth every 4 (four) hours as needed.    . [DISCONTINUED] Potassium Chloride ER 20 MEQ TBCR Take 1 tablet by mouth daily. 30 tablet 3   No current facility-administered medications on file prior to visit.        Review of Systems neg for any significant sore throat, dysphagia, itching, sneezing, nasal congestion or excess/ purulent secretions, fever, chills, sweats, unintended wt loss, pleuritic or exertional cp, hempoptysis, orthopnea pnd or change in chronic leg swelling. Also denies presyncope, palpitations, heartburn, abdominal pain, nausea, vomiting, diarrhea or change in bowel or urinary habits, dysuria,hematuria, rash, arthralgias, visual complaints, headache, numbness weakness or ataxia.     Objective:   Physical Exam Vitals:   08/01/16 0900  BP: 116/78  Pulse: (!) 57  Temp: 98.3 F (36.8 C)  TempSrc: Oral  SpO2: 97%  Weight: 28 lb (12.7 kg)  Height: 5\' 7"  (1.702 m)  Body mass index is 4.39 kg/m.   Gen. Pleasant, obese, in no distress ENT - no lesions, no post nasal drip Neck: No JVD, no thyromegaly, no carotid bruits Lungs: no use of accessory muscles, no dullness to percussion,  CTA w/ No wheezing  Cardiovascular: Rhythm regular, heart sounds  normal, no murmurs or gallops, no peripheral edema Musculoskeletal: No deformities, no cyanosis or clubbing , no tremors   CXR 08/26/15 NAD   Annissa Andreoni NP-C  [facility]  08/01/2016

## 2016-08-01 NOTE — Addendum Note (Signed)
Addended by: [name] on: 08/01/2016 09:43 AM   Modules accepted: Orders

## 2016-08-02 NOTE — Progress Notes (Signed)
Reviewed & agree with plan  

## 2016-08-03 NOTE — Progress Notes (Signed)
Called spoke with pt. Reviewed results and recs. Pt voiced understanding and had no further questions.

## 2016-08-13 ENCOUNTER — Other Ambulatory Visit: Payer: Self-pay | Admitting: Adult Health

## 2016-08-13 ENCOUNTER — Other Ambulatory Visit: Payer: Self-pay | Admitting: Family Medicine

## 2016-08-15 NOTE — Telephone Encounter (Signed)
Spoke with pt. Advised her that this prescription has been sent in. Nothing further was needed.

## 2016-08-17 ENCOUNTER — Other Ambulatory Visit: Payer: Self-pay | Admitting: Internal Medicine

## 2016-08-17 DIAGNOSIS — M179 Osteoarthritis of knee, unspecified: Secondary | ICD-10-CM | POA: Diagnosis not present

## 2016-08-17 DIAGNOSIS — G894 Chronic pain syndrome: Secondary | ICD-10-CM | POA: Diagnosis not present

## 2016-08-17 DIAGNOSIS — M47816 Spondylosis without myelopathy or radiculopathy, lumbar region: Secondary | ICD-10-CM | POA: Diagnosis not present

## 2016-08-17 DIAGNOSIS — Z79891 Long term (current) use of opiate analgesic: Secondary | ICD-10-CM | POA: Diagnosis not present

## 2016-08-20 ENCOUNTER — Other Ambulatory Visit: Payer: Self-pay | Admitting: Gynecology

## 2016-08-23 DIAGNOSIS — J45909 Unspecified asthma, uncomplicated: Secondary | ICD-10-CM | POA: Diagnosis not present

## 2016-09-06 NOTE — Progress Notes (Signed)
Pt is being scheduled for preop appt; please place surgical orders in epic. Thanks.  

## 2016-09-11 NOTE — H&P (Signed)
TOTAL KNEE ADMISSION H&P  Patient is being admitted for left total knee arthroplasty.  Subjective:  Chief Complaint:l   Left knee primary OA / pain  HPI: Shelby Anderson, 60 y.o. female, has a history of pain and functional disability in the right knee due to arthritis and has failed non-surgical conservative treatments for greater than 12 weeks to include NSAID's and/or analgesics, corticosteriod injections and activity modification.  Onset of symptoms was gradual, starting 20+ years ago with gradually worsening course since that time. The patient noted prior procedures on the knee to include  arthroscopy on the left knee(s).  Patient currently rates pain in the left knee(s) at 10 out of 10 with activity. Patient has night pain, worsening of pain with activity and weight bearing, pain that interferes with activities of daily living, pain with passive range of motion, crepitus and joint swelling.  Patient has evidence of periarticular osteophytes and joint space narrowing by imaging studies. There is no active infection.   Risks, benefits and expectations were discussed with the patient.  Risks including but not limited to the risk of anesthesia, blood clots, nerve damage, blood vessel damage, failure of the prosthesis, infection and up to and including death.  Patient understand the risks, benefits and expectations and wishes to proceed with surgery.   PCP: Lucretia Kern., DO  D/C Plans:      Home with HHPT  Post-op Meds:       No Rx given   Tranexamic Acid:      To be given - IV   Decadron:      Is to be given  FYI:     ASA  Oxycodone    Patient Active Problem List   Diagnosis Date Noted  . Preoperative clearance 08/01/2016  . Increased endometrial stripe thickness 06/27/2016  . Intramural leiomyoma of uterus 06/27/2016  . Mild episode of recurrent major depressive disorder ([place]) 12/06/2015  . Type 2 diabetes mellitus without complication, without long-term current use of insulin  ([place]) 12/06/2015  . Hyperlipemia 11/03/2015  . Chronic respiratory failure ([place]) 09/15/2015  . HTN (hypertension) 07/19/2015  . Arrhythmia 07/19/2015  . Orthostatic hypotension 07/19/2015  . GERD (gastroesophageal reflux disease) 07/19/2015  . Chronic back pain 07/19/2015  . Neuropathy ([place]) 07/19/2015  . Postmenopausal bleeding 03/25/2013  . Asthma 04/29/2012  . Coronary artery disease 11/20/2011   Past Medical History:  Diagnosis Date  . Anemia    "chronic"  . Angina   . Anxiety and depression   . Atrial fibrillation ([place])    h/o "AF w/frequent PVCs"  . Chronic back pain greater than 3 months duration    on chronic narcotics, treated at pain clinic  . Colon polyps    hyperplastic  . Coronary artery disease    Arrythmia, orthostatic hypotension, HLD, HTN; sees Dr. Einar Gip  . Difficult intubation    "TMJ & woke up when they were still cutting on me"  . Dysrhythmia    sees Dr. Einar Gip and a cardiologist at [facility]  . Esophageal stricture   . Fatty liver   . Fibroids   . Fibromyalgia    "in my legs"  . GERD (gastroesophageal reflux disease)    hx hiatal hernia, stricture and gastric ulcer  . Headache(784.0)   . Heart murmur   . Hiatal hernia   . History of migraines    "dx'd when I was in my teens"  . Hyperlipemia   . Hypertension   . Mental disorder   .  Mild episode of recurrent major depressive disorder ([place]) 12/06/2015  . Myocardial infarction 1980's & 1990;   sees Dr. Einar Gip  . Pneumonia   . PONV (postoperative nausea and vomiting)   . Recurrent upper respiratory infection (URI)   . Shortness of breath 11/20/11   "all the time", sees pulmonlogy, ? asthma  . Stenotic cervical os   . Stomach ulcer    "3 small; found in 05/2011"  . TMJ (dislocation of temporomandibular joint)   . Tuberculosis    + TB SKIN TEST  . Type 2 diabetes mellitus without complication, without long-term current use of insulin ([place]) 12/06/2015    Past Surgical History:  Procedure  Laterality Date  . ACHILLES TENDON REPAIR  1970's   left ankle  . ARTHROSCOPIC REPAIR ACL     left knee cap  . CARDIAC CATHETERIZATION     loop recorder  . CARPAL TUNNEL RELEASE  unknown   left hand  . post ganglionectomy  1970's   "for migraine headaches"  . pouch string  [phone]   "did this 3 times (once w/each pregnancy)"  . TUBAL LIGATION  1980's    No prescriptions prior to admission.   Allergies  Allergen Reactions  . Oxycontin [Oxycodone Hcl] Anaphylaxis    hives, trouble breathing, tongue swelling (can't  tolerate oxycontin ) \  . Aspirin Other (See Comments)    GI Bleeds  . Darvocet [Propoxyphene N-Acetaminophen] Hives  . Lodine [Etodolac] Hives and Swelling  . Nitroglycerin     IV-BP drops  . Penicillins Other (See Comments)    unknown  . Ultram [Tramadol Hcl] Hives  . Valium Other (See Comments)    Circulation problems    Social History  Substance Use Topics  . Smoking status: Never Smoker  . Smokeless tobacco: Never Used  . Alcohol use No    Family History  Problem Relation Age of Onset  . Malignant hyperthermia Father   . Hypertension Father   . Heart disease Father   . Diabetes Father   . Cancer Father     skin  . Hypertension Mother   . Heart disease Mother   . Cancer Sister     CERVICAL  . Hypertension Sister   . Cancer Brother     MELANOMA  . Heart disease Maternal Grandmother   . Heart disease Maternal Grandfather   . Cancer Paternal Grandmother     ?   Marland Kitchen Heart disease Paternal Grandmother   . Heart disease Paternal Grandfather   . Cancer Brother     LUNG  . Diabetes Sister   . Hypertension Sister   . Heart disease Sister   . Cancer Sister   . Cancer Brother   . Anesthesia problems Neg Hx   . Hypotension Neg Hx   . Pseudochol deficiency Neg Hx      Review of Systems  Constitutional: Negative.   Eyes: Negative.   Respiratory: Negative.   Cardiovascular: Negative.   Gastrointestinal: Positive for heartburn.   Genitourinary: Negative.   Musculoskeletal: Positive for back pain and joint pain.  Skin: Negative.   Neurological: Positive for headaches.  Endo/Heme/Allergies: Negative.   Psychiatric/Behavioral: The patient is nervous/anxious.     Objective:  Physical Exam  Constitutional: She is oriented to person, place, and time. She appears well-developed.  HENT:  Head: Normocephalic.  Mouth/Throat: She has dentures.  Eyes: Pupils are equal, round, and reactive to light.  Neck: Neck supple. No JVD present. No tracheal deviation present. No thyromegaly  present.  Cardiovascular: Normal rate, regular rhythm and intact distal pulses.   Murmur heard. Respiratory: Effort normal and breath sounds normal. No respiratory distress. She has no wheezes.  GI: Soft. There is no tenderness. There is no guarding.  Musculoskeletal:       Left knee: She exhibits decreased range of motion, swelling and bony tenderness. She exhibits no ecchymosis, no deformity, no laceration and no erythema. Tenderness found.  Lymphadenopathy:    She has no cervical adenopathy.  Neurological: She is alert and oriented to person, place, and time.  Skin: Skin is warm and dry.  Psychiatric: She has a normal mood and affect.     Labs:  Estimated body mass index is 34.14 kg/m as calculated from the following:   Height as of 08/01/16: 5\' 7"  (1.702 m).   Weight as of 08/01/16: 98.9 kg (218 lb).   Imaging Review Plain radiographs demonstrate severe degenerative joint disease of the left knee(s). The overall alignment is neutral. The bone quality appears to be good for age and reported activity level.  Assessment/Plan:  End stage arthritis, left knee   The patient history, physical examination, clinical judgment of the provider and imaging studies are consistent with end stage degenerative joint disease of the left knee(s) and total knee arthroplasty is deemed medically necessary. The treatment options including medical  management, injection therapy arthroscopy and arthroplasty were discussed at length. The risks and benefits of total knee arthroplasty were presented and reviewed. The risks due to aseptic loosening, infection, stiffness, patella tracking problems, thromboembolic complications and other imponderables were discussed. The patient acknowledged the explanation, agreed to proceed with the plan and consent was signed. Patient is being admitted for inpatient treatment for surgery, pain control, PT, OT, prophylactic antibiotics, VTE prophylaxis, progressive ambulation and ADL's and discharge planning. The patient is planning to be discharged home with home health services.       [place] Lasheika Ortloff   PA-C  09/11/2016, 9:16 PM

## 2016-09-14 DIAGNOSIS — M47816 Spondylosis without myelopathy or radiculopathy, lumbar region: Secondary | ICD-10-CM | POA: Diagnosis not present

## 2016-09-14 DIAGNOSIS — Z79891 Long term (current) use of opiate analgesic: Secondary | ICD-10-CM | POA: Diagnosis not present

## 2016-09-14 DIAGNOSIS — M179 Osteoarthritis of knee, unspecified: Secondary | ICD-10-CM | POA: Diagnosis not present

## 2016-09-14 DIAGNOSIS — G894 Chronic pain syndrome: Secondary | ICD-10-CM | POA: Diagnosis not present

## 2016-09-19 ENCOUNTER — Encounter (HOSPITAL_COMMUNITY)
Admission: RE | Admit: 2016-09-19 | Discharge: 2016-09-19 | Disposition: A | Discharge status: Home / Self Care | Payer: Medicare Other | Source: Ambulatory Visit | Attending: Orthopedic Surgery | Admitting: Orthopedic Surgery

## 2016-09-19 ENCOUNTER — Encounter (HOSPITAL_COMMUNITY): Payer: Self-pay

## 2016-09-19 ENCOUNTER — Telehealth: Payer: Self-pay | Admitting: Pulmonary Disease

## 2016-09-19 DIAGNOSIS — Z01818 Encounter for other preprocedural examination: Secondary | ICD-10-CM | POA: Diagnosis not present

## 2016-09-19 DIAGNOSIS — E119 Type 2 diabetes mellitus without complications: Secondary | ICD-10-CM | POA: Diagnosis not present

## 2016-09-19 DIAGNOSIS — I10 Essential (primary) hypertension: Secondary | ICD-10-CM | POA: Diagnosis not present

## 2016-09-19 HISTORY — DX: Other specified postprocedural states: Z98.890

## 2016-09-19 LAB — BASIC METABOLIC PANEL
Anion gap: 8 (ref 5–15)
BUN: 12 mg/dL (ref 6–20)
CO2: 31 mmol/L (ref 22–32)
CREATININE: 0.68 mg/dL (ref 0.44–1.00)
Calcium: 9.2 mg/dL (ref 8.9–10.3)
Chloride: 103 mmol/L (ref 101–111)
Glucose, Bld: 90 mg/dL (ref 65–99)
POTASSIUM: 4.1 mmol/L (ref 3.5–5.1)
SODIUM: 142 mmol/L (ref 135–145)

## 2016-09-19 LAB — SURGICAL PCR SCREEN
MRSA, PCR: NEGATIVE
STAPHYLOCOCCUS AUREUS: NEGATIVE

## 2016-09-19 LAB — CBC
HEMATOCRIT: 41.9 % (ref 36.0–46.0)
Hemoglobin: 13 g/dL (ref 12.0–15.0)
MCH: 28.2 pg (ref 26.0–34.0)
MCHC: 31 g/dL (ref 30.0–36.0)
MCV: 90.9 fL (ref 78.0–100.0)
PLATELETS: 260 10*3/uL (ref 150–400)
RBC: 4.61 MIL/uL (ref 3.87–5.11)
RDW: 14.4 % (ref 11.5–15.5)
WBC: 6.1 10*3/uL (ref 4.0–10.5)

## 2016-09-19 LAB — ABO/RH: ABO/RH(D): O POS

## 2016-09-19 LAB — GLUCOSE, CAPILLARY: Glucose-Capillary: 110 mg/dL — ABNORMAL HIGH (ref 65–99)

## 2016-09-19 MED ORDER — ALBUTEROL SULFATE (2.5 MG/3ML) 0.083% IN NEBU: 2.5000 mg | INHALATION_SOLUTION | Freq: Four times a day (QID) | RESPIRATORY_TRACT | 3 refills | Status: DC | PRN

## 2016-09-19 MED ORDER — ALBUTEROL SULFATE HFA 108 (90 BASE) MCG/ACT IN AERS: 2.0000 | INHALATION_SPRAY | RESPIRATORY_TRACT | 3 refills | Status: DC | PRN

## 2016-09-19 MED ORDER — MOMETASONE FURO-FORMOTEROL FUM 200-5 MCG/ACT IN AERO: 2.0000 | INHALATION_SPRAY | Freq: Two times a day (BID) | RESPIRATORY_TRACT | 4 refills | Status: DC

## 2016-09-19 NOTE — Telephone Encounter (Signed)
Rx sent to pharmacy for 3 refills. Patient aware. Nothing further needed.

## 2016-09-19 NOTE — Progress Notes (Signed)
   09/19/16 1409  OBSTRUCTIVE SLEEP APNEA  Have you ever been diagnosed with sleep apnea through a sleep study? No  Do you snore loudly (loud enough to be heard through closed doors)?  1  Do you often feel tired, fatigued, or sleepy during the daytime (such as falling asleep during driving or talking to someone)? 1  Has anyone observed you stop breathing during your sleep? 0  Do you have, or are you being treated for high blood pressure? 1  BMI more than 35 kg/m2? 1  Age > 50 (1-yes) 1  Neck circumference greater than:Female 16 inches or larger, Female 17inches or larger? 1  Female Gender (Yes=1) 0  Obstructive Sleep Apnea Score 6  Score 5 or greater  Results sent to PCP

## 2016-09-19 NOTE — Patient Instructions (Addendum)
Shelby Anderson  09/19/2016   Your procedure is scheduled on: Tuesday 09/25/2016  Report to [facility]  Entrance take [place]  elevators to 3rd floor to  [place] at  1245 PM.  Call this number if you have problems the morning of surgery [phone]   Remember: ONLY 1 PERSON MAY GO WITH YOU TO SHORT STAY TO GET  READY MORNING OF [name]  :After Midnight. MAY HAVE CLEAR LIQUIDS FROM MIDNIGHT UP UNTIL 0945 AM THEN NOTHING UNTIL AFTER SURGERY!     CLEAR LIQUID DIET   Foods Allowed                                                                     Foods Excluded  Coffee and tea, regular and decaf                             liquids that you cannot  Plain Jell-O in any flavor                                             see through such as: Fruit ices (not with fruit pulp)                                     milk, soups, orange juice  Iced Popsicles                                    All solid food Carbonated beverages, regular and diet                                    Cranberry, grape and apple juices Sports drinks like Gatorade Lightly seasoned clear broth or consume(fat free) Sugar, honey syrup  Sample Menu Breakfast                                Lunch                                     Supper Cranberry juice                    Beef broth                            Chicken broth Jell-O                                     Grape juice  Apple juice Coffee or tea                        Jell-O                                      Popsicle                                                Coffee or tea                        Coffee or tea  _____________________________________________________________________     Take these medicines the morning of surgery with A SIP OF WATER: METOPROLOL, LYRICA, IMDUR, ALBUTEROL INHALER IF NEEDED, DULERA INHALER               DO NOT TAKE ANY DIABETIC  MEDICATIONS DAY OF YOUR SURGERY!                               You may not have any metal on your body including hair pins and              piercings  Do not wear jewelry, make-up, lotions, powders or perfumes, deodorant             Do not wear nail polish.  Do not shave  48 hours prior to surgery.              Men may shave face and neck.   Do not bring valuables to the hospital. [place].  Contacts, dentures or bridgework may not be worn into surgery.  Leave suitcase in the car. After surgery it may be brought to your room.                  Please read over the following fact sheets you were given: _____________________________________________________________________             [facility] - Preparing for Surgery Before surgery, you can play an important role.  Because skin is not sterile, your skin needs to be as free of germs as possible.  You can reduce the number of germs on your skin by washing with CHG (chlorahexidine gluconate) soap before surgery.  CHG is an antiseptic cleaner which kills germs and bonds with the skin to continue killing germs even after washing. Please DO NOT use if you have an allergy to CHG or antibacterial soaps.  If your skin becomes reddened/irritated stop using the CHG and inform your nurse when you arrive at Short Stay. Do not shave (including legs and underarms) for at least 48 hours prior to the first CHG shower.  You may shave your face/neck. Please follow these instructions carefully:  1.  Shower with CHG Soap the night before surgery and the  morning of Surgery.  2.  If you choose to wash your hair, wash your hair first as usual with your  normal  shampoo.  3.  After you shampoo, rinse your hair and body thoroughly to remove the  shampoo.  4.  Use CHG as you would any other liquid soap.  You can apply chg directly  to the skin and wash                       Gently with a  scrungie or clean washcloth.  5.  Apply the CHG Soap to your body ONLY FROM THE NECK DOWN.   Do not use on face/ open                           Wound or open sores. Avoid contact with eyes, ears mouth and genitals (private parts).                       Wash face,  Genitals (private parts) with your normal soap.             6.  Wash thoroughly, paying special attention to the area where your surgery  will be performed.  7.  Thoroughly rinse your body with warm water from the neck down.  8.  DO NOT shower/wash with your normal soap after using and rinsing off  the CHG Soap.                9.  Pat yourself dry with a clean towel.            10.  Wear clean pajamas.            11.  Place clean sheets on your bed the night of your first shower and do not  sleep with pets. Day of Surgery : Do not apply any lotions/deodorants the morning of surgery.  Please wear clean clothes to the hospital/surgery center.  FAILURE TO FOLLOW THESE INSTRUCTIONS MAY RESULT IN THE CANCELLATION OF YOUR SURGERY PATIENT SIGNATURE_________________________________  NURSE SIGNATURE__________________________________  ________________________________________________________________________   Shelby Anderson  An incentive spirometer is a tool that can help keep your lungs clear and active. This tool measures how well you are filling your lungs with each breath. Taking long deep breaths may help reverse or decrease the chance of developing breathing (pulmonary) problems (especially infection) following:  A long period of time when you are unable to move or be active. BEFORE THE PROCEDURE   If the spirometer includes an indicator to show your best effort, your nurse or respiratory therapist will set it to a desired goal.  If possible, sit up straight or lean slightly forward. Try not to slouch.  Hold the incentive spirometer in an upright position. INSTRUCTIONS FOR USE  1. Sit on the edge of your bed if possible,  or sit up as far as you can in bed or on a chair. 2. Hold the incentive spirometer in an upright position. 3. Breathe out normally. 4. Place the mouthpiece in your mouth and seal your lips tightly around it. 5. Breathe in slowly and as deeply as possible, raising the piston or the ball toward the top of the column. 6. Hold your breath for 3-5 seconds or for as long as possible. Allow the piston or ball to fall to the bottom of the column. 7. Remove the mouthpiece from your mouth and breathe out normally. 8. Rest for a few seconds and repeat Steps 1 through 7 at least 10 times every 1-2 hours when you are awake. Take your time and take a few normal breaths between deep breaths. 9. The spirometer may include an indicator to  show your best effort. Use the indicator as a goal to work toward during each repetition. 10. After each set of 10 deep breaths, practice coughing to be sure your lungs are clear. If you have an incision (the cut made at the time of surgery), support your incision when coughing by placing a pillow or rolled up towels firmly against it. Once you are able to get out of bed, walk around indoors and cough well. You may stop using the incentive spirometer when instructed by your caregiver.  RISKS AND COMPLICATIONS  Take your time so you do not get dizzy or light-headed.  If you are in pain, you may need to take or ask for pain medication before doing incentive spirometry. It is harder to take a deep breath if you are having pain. AFTER USE  Rest and breathe slowly and easily.  It can be helpful to keep track of a log of your progress. Your caregiver can provide you with a simple table to help with this. If you are using the spirometer at home, follow these instructions: [place] IF:   You are having difficultly using the spirometer.  You have trouble using the spirometer as often as instructed.  Your pain medication is not giving enough relief while using the  spirometer.  You develop fever of 100.5 F (38.1 C) or higher. SEEK IMMEDIATE MEDICAL CARE IF:   You cough up bloody sputum that had not been present before.  You develop fever of 102 F (38.9 C) or greater.  You develop worsening pain at or near the incision site. MAKE SURE YOU:   Understand these instructions.  Will watch your condition.  Will get help right away if you are not doing well or get worse. Document Released: 04/01/2007 Document Revised: 02/11/2012 Document Reviewed: 06/02/2007 ExitCare Patient Information 2014 Exit[employer], [place].   ________________________________________________________________________  WHAT IS A BLOOD TRANSFUSION? Blood Transfusion Information  A transfusion is the replacement of blood or some of its parts. Blood is made up of multiple cells which provide different functions.  Red blood cells carry oxygen and are used for blood loss replacement.  White blood cells fight against infection.  Platelets control bleeding.  Plasma helps clot blood.  Other blood products are available for specialized needs, such as hemophilia or other clotting disorders. BEFORE THE TRANSFUSION  Who gives blood for transfusions?   Healthy volunteers who are fully evaluated to make sure their blood is safe. This is blood bank blood. Transfusion therapy is the safest it has ever been in the practice of medicine. Before blood is taken from a donor, a complete history is taken to make sure that person has no history of diseases nor engages in risky social behavior (examples are intravenous drug use or sexual activity with multiple partners). The donor's travel history is screened to minimize risk of transmitting infections, such as malaria. The donated blood is tested for signs of infectious diseases, such as HIV and hepatitis. The blood is then tested to be sure it is compatible with you in order to minimize the chance of a transfusion reaction. If you or a relative  donates blood, this is often done in anticipation of surgery and is not appropriate for emergency situations. It takes many days to process the donated blood. RISKS AND COMPLICATIONS Although transfusion therapy is very safe and saves many lives, the main dangers of transfusion include:   Getting an infectious disease.  Developing a transfusion reaction. This is an allergic reaction  to something in the blood you were given. Every precaution is taken to prevent this. The decision to have a blood transfusion has been considered carefully by your caregiver before blood is given. Blood is not given unless the benefits outweigh the risks. AFTER THE TRANSFUSION  Right after receiving a blood transfusion, you will usually feel much better and more energetic. This is especially true if your red blood cells have gotten low (anemic). The transfusion raises the level of the red blood cells which carry oxygen, and this usually causes an energy increase.  The nurse administering the transfusion will monitor you carefully for complications. HOME CARE INSTRUCTIONS  No special instructions are needed after a transfusion. You may find your energy is better. Speak with your caregiver about any limitations on activity for underlying diseases you may have. SEEK MEDICAL CARE IF:   Your condition is not improving after your transfusion.  You develop redness or irritation at the intravenous (IV) site. SEEK IMMEDIATE MEDICAL CARE IF:  Any of the following symptoms occur over the next 12 hours:  Shaking chills.  You have a temperature by mouth above 102 F (38.9 C), not controlled by medicine.  Chest, back, or muscle pain.  People around you feel you are not acting correctly or are confused.  Shortness of breath or difficulty breathing.  Dizziness and fainting.  You get a rash or develop hives.  You have a decrease in urine output.  Your urine turns a dark color or changes to pink, red, or brown. Any  of the following symptoms occur over the next 10 days:  You have a temperature by mouth above 102 F (38.9 C), not controlled by medicine.  Shortness of breath.  Weakness after normal activity.  The white part of the eye turns yellow (jaundice).  You have a decrease in the amount of urine or are urinating less often.  Your urine turns a dark color or changes to pink, red, or brown. Document Released: 11/16/2000 Document Revised: 02/11/2012 Document Reviewed: 07/05/2008 [facility] Patient Information 2014 [place], [place].  _______________________________________________________________________

## 2016-09-19 NOTE — Telephone Encounter (Signed)
RX sent to preferred pharmacy for ventolin & dulera. Pt is requesting refills on albuterol neb solution as well, however we did not prescribe it. I have made pt aware that ventolin & dulera has been sent in and as far as neb solution, I will have to ask our on call doctor as Dr. Elsworth Soho isn't in office.  JN please advise if ok to refill neb solution.

## 2016-09-19 NOTE — Telephone Encounter (Signed)
Go ahead and give her 3 refills on her nebulizer solution.

## 2016-09-20 LAB — HEMOGLOBIN A1C
HEMOGLOBIN A1C: 6.3 % — AB (ref 4.8–5.6)
Mean Plasma Glucose: 134 mg/dL

## 2016-09-22 DIAGNOSIS — J45909 Unspecified asthma, uncomplicated: Secondary | ICD-10-CM | POA: Diagnosis not present

## 2016-09-24 NOTE — Anesthesia Preprocedure Evaluation (Addendum)
Anesthesia Evaluation  Patient identified by MRN, date of birth, ID band Patient awake    Reviewed: Allergy & Precautions, H&P , Patient's Chart, lab work & pertinent test results  History of Anesthesia Complications (+) history of anesthetic complications  Airway Mallampati: II  TM Distance: >3 FB Neck ROM: full    Dental no notable dental hx.    Pulmonary    Pulmonary exam normal breath sounds clear to auscultation       Cardiovascular Exercise Tolerance: Good hypertension,  Rhythm:regular Rate:Normal     Neuro/Psych    GI/Hepatic   Endo/Other  diabetes  Renal/GU      Musculoskeletal   Abdominal   Peds  Hematology   Anesthesia Other Findings Difficult intubation  HTN; sees Dr. Einar Gip Hypertension    GERD (gastroesophageal reflux disease)   hx hiatal hernia, Headache(784.0)     TMJ    Dysrhythmia sees Dr. Einar Gip at [place] fibrillation ( currently sinus Loletha Grayer) frequent PVCs" Myocardial infarction 1980's & 1990;  ??ECHO REPORT  Chronic back pain on chronic narcotics treated at pain clinic  depressive disorder ([place]) 12/06/2015   Type 2 diabetes mellitus without complication  Hiatal hernia         Reproductive/Obstetrics                            Anesthesia Physical Anesthesia Plan  ASA: II  Anesthesia Plan: Spinal   Post-op Pain Management:    Induction:   Airway Management Planned:   Additional Equipment:   Intra-op Plan:   Post-operative Plan:   Informed Consent: I have reviewed the patients History and Physical, chart, labs and discussed the procedure including the risks, benefits and alternatives for the proposed anesthesia with the patient or authorized representative who has indicated his/her understanding and acceptance.   Dental Advisory Given  Plan Discussed with: CRNA  Anesthesia Plan Comments: (Lab work confirmed with CRNA in room.  Platelets okay. Discussed spinal anesthetic, and patient consents to the procedure:  included risk of possible headache,backache, failed block, allergic reaction, and nerve injury. This patient was asked if she had any questions or concerns before the procedure started. )        Anesthesia Quick Evaluation

## 2016-09-24 NOTE — Progress Notes (Signed)
Patient called with time change.  Patient aware to arrive at 0600am on 09/25/2016 and surgery at 0855am.  .  Patient made aware npo after midnite.  Patient voiced understanding.

## 2016-09-25 ENCOUNTER — Inpatient Hospital Stay (HOSPITAL_COMMUNITY)
Admission: RE | Admit: 2016-09-25 | Discharge: 2016-09-26 | DRG: 470 | Disposition: A | Discharge status: Home Health | Payer: Medicare Other | Source: Ambulatory Visit | Attending: Orthopedic Surgery | Admitting: Orthopedic Surgery

## 2016-09-25 ENCOUNTER — Inpatient Hospital Stay (HOSPITAL_COMMUNITY): Payer: Medicare Other | Admitting: Anesthesiology

## 2016-09-25 ENCOUNTER — Encounter (HOSPITAL_COMMUNITY): Payer: Self-pay | Admitting: *Deleted

## 2016-09-25 ENCOUNTER — Encounter (HOSPITAL_COMMUNITY)
Admission: RE | Disposition: A | Discharge status: Home Health | Payer: Self-pay | Source: Ambulatory Visit | Attending: Orthopedic Surgery

## 2016-09-25 DIAGNOSIS — K219 Gastro-esophageal reflux disease without esophagitis: Secondary | ICD-10-CM | POA: Diagnosis not present

## 2016-09-25 DIAGNOSIS — M1712 Unilateral primary osteoarthritis, left knee: Principal | ICD-10-CM | POA: Diagnosis present

## 2016-09-25 DIAGNOSIS — G4700 Insomnia, unspecified: Secondary | ICD-10-CM | POA: Diagnosis not present

## 2016-09-25 DIAGNOSIS — M25762 Osteophyte, left knee: Secondary | ICD-10-CM | POA: Diagnosis present

## 2016-09-25 DIAGNOSIS — Z86010 Personal history of colonic polyps: Secondary | ICD-10-CM

## 2016-09-25 DIAGNOSIS — I951 Orthostatic hypotension: Secondary | ICD-10-CM | POA: Diagnosis not present

## 2016-09-25 DIAGNOSIS — Z9284 Personal history of unintended awareness under general anesthesia: Secondary | ICD-10-CM

## 2016-09-25 DIAGNOSIS — Z888 Allergy status to other drugs, medicaments and biological substances status: Secondary | ICD-10-CM

## 2016-09-25 DIAGNOSIS — R001 Bradycardia, unspecified: Secondary | ICD-10-CM | POA: Diagnosis present

## 2016-09-25 DIAGNOSIS — M549 Dorsalgia, unspecified: Secondary | ICD-10-CM | POA: Diagnosis not present

## 2016-09-25 DIAGNOSIS — M25562 Pain in left knee: Secondary | ICD-10-CM | POA: Diagnosis not present

## 2016-09-25 DIAGNOSIS — Z8049 Family history of malignant neoplasm of other genital organs: Secondary | ICD-10-CM

## 2016-09-25 DIAGNOSIS — D251 Intramural leiomyoma of uterus: Secondary | ICD-10-CM | POA: Diagnosis present

## 2016-09-25 DIAGNOSIS — K760 Fatty (change of) liver, not elsewhere classified: Secondary | ICD-10-CM | POA: Diagnosis present

## 2016-09-25 DIAGNOSIS — Z8249 Family history of ischemic heart disease and other diseases of the circulatory system: Secondary | ICD-10-CM

## 2016-09-25 DIAGNOSIS — Z885 Allergy status to narcotic agent status: Secondary | ICD-10-CM

## 2016-09-25 DIAGNOSIS — I2510 Atherosclerotic heart disease of native coronary artery without angina pectoris: Secondary | ICD-10-CM | POA: Diagnosis not present

## 2016-09-25 DIAGNOSIS — G8929 Other chronic pain: Secondary | ICD-10-CM | POA: Diagnosis present

## 2016-09-25 DIAGNOSIS — I10 Essential (primary) hypertension: Secondary | ICD-10-CM | POA: Diagnosis not present

## 2016-09-25 DIAGNOSIS — Z972 Presence of dental prosthetic device (complete) (partial): Secondary | ICD-10-CM

## 2016-09-25 DIAGNOSIS — E669 Obesity, unspecified: Secondary | ICD-10-CM | POA: Diagnosis present

## 2016-09-25 DIAGNOSIS — Z7982 Long term (current) use of aspirin: Secondary | ICD-10-CM

## 2016-09-25 DIAGNOSIS — I4891 Unspecified atrial fibrillation: Secondary | ICD-10-CM | POA: Diagnosis present

## 2016-09-25 DIAGNOSIS — Z880 Allergy status to penicillin: Secondary | ICD-10-CM

## 2016-09-25 DIAGNOSIS — F339 Major depressive disorder, recurrent, unspecified: Secondary | ICD-10-CM | POA: Diagnosis present

## 2016-09-25 DIAGNOSIS — R011 Cardiac murmur, unspecified: Secondary | ICD-10-CM | POA: Diagnosis not present

## 2016-09-25 DIAGNOSIS — Z8711 Personal history of peptic ulcer disease: Secondary | ICD-10-CM

## 2016-09-25 DIAGNOSIS — Z7952 Long term (current) use of systemic steroids: Secondary | ICD-10-CM

## 2016-09-25 DIAGNOSIS — J45909 Unspecified asthma, uncomplicated: Secondary | ICD-10-CM | POA: Diagnosis present

## 2016-09-25 DIAGNOSIS — E1140 Type 2 diabetes mellitus with diabetic neuropathy, unspecified: Secondary | ICD-10-CM | POA: Diagnosis present

## 2016-09-25 DIAGNOSIS — G43909 Migraine, unspecified, not intractable, without status migrainosus: Secondary | ICD-10-CM | POA: Diagnosis present

## 2016-09-25 DIAGNOSIS — Z96659 Presence of unspecified artificial knee joint: Secondary | ICD-10-CM

## 2016-09-25 DIAGNOSIS — R269 Unspecified abnormalities of gait and mobility: Secondary | ICD-10-CM | POA: Diagnosis not present

## 2016-09-25 DIAGNOSIS — S0300XA Dislocation of jaw, unspecified side, initial encounter: Secondary | ICD-10-CM | POA: Diagnosis not present

## 2016-09-25 DIAGNOSIS — Z79899 Other long term (current) drug therapy: Secondary | ICD-10-CM

## 2016-09-25 DIAGNOSIS — G8918 Other acute postprocedural pain: Secondary | ICD-10-CM | POA: Diagnosis not present

## 2016-09-25 DIAGNOSIS — Z79891 Long term (current) use of opiate analgesic: Secondary | ICD-10-CM

## 2016-09-25 DIAGNOSIS — M797 Fibromyalgia: Secondary | ICD-10-CM | POA: Diagnosis present

## 2016-09-25 DIAGNOSIS — I252 Old myocardial infarction: Secondary | ICD-10-CM | POA: Diagnosis not present

## 2016-09-25 DIAGNOSIS — J189 Pneumonia, unspecified organism: Secondary | ICD-10-CM | POA: Diagnosis not present

## 2016-09-25 DIAGNOSIS — J4540 Moderate persistent asthma, uncomplicated: Secondary | ICD-10-CM | POA: Diagnosis not present

## 2016-09-25 DIAGNOSIS — E785 Hyperlipidemia, unspecified: Secondary | ICD-10-CM | POA: Diagnosis not present

## 2016-09-25 DIAGNOSIS — J9610 Chronic respiratory failure, unspecified whether with hypoxia or hypercapnia: Secondary | ICD-10-CM | POA: Diagnosis present

## 2016-09-25 DIAGNOSIS — Z886 Allergy status to analgesic agent status: Secondary | ICD-10-CM

## 2016-09-25 DIAGNOSIS — Z7951 Long term (current) use of inhaled steroids: Secondary | ICD-10-CM

## 2016-09-25 DIAGNOSIS — Z79890 Hormone replacement therapy (postmenopausal): Secondary | ICD-10-CM

## 2016-09-25 DIAGNOSIS — K449 Diaphragmatic hernia without obstruction or gangrene: Secondary | ICD-10-CM | POA: Diagnosis present

## 2016-09-25 DIAGNOSIS — Z833 Family history of diabetes mellitus: Secondary | ICD-10-CM

## 2016-09-25 DIAGNOSIS — Z6830 Body mass index (BMI) 30.0-30.9, adult: Secondary | ICD-10-CM

## 2016-09-25 DIAGNOSIS — R0902 Hypoxemia: Secondary | ICD-10-CM | POA: Diagnosis not present

## 2016-09-25 HISTORY — PX: TOTAL KNEE ARTHROPLASTY: SHX125

## 2016-09-25 HISTORY — DX: Presence of unspecified artificial knee joint: Z96.659

## 2016-09-25 LAB — TYPE AND SCREEN
ABO/RH(D): O POS
Antibody Screen: NEGATIVE

## 2016-09-25 LAB — GLUCOSE, CAPILLARY
GLUCOSE-CAPILLARY: 89 mg/dL (ref 65–99)
Glucose-Capillary: 104 mg/dL — ABNORMAL HIGH (ref 65–99)

## 2016-09-25 SURGERY — ARTHROPLASTY, KNEE, TOTAL
Anesthesia: Spinal | Site: Knee | Laterality: Left

## 2016-09-25 MED ORDER — METHOCARBAMOL 500 MG PO TABS
500.0000 mg | ORAL_TABLET | Freq: Four times a day (QID) | ORAL | Status: DC | PRN
  Administered 2016-09-25: 500 mg via ORAL
  Filled 2016-09-25: qty 1

## 2016-09-25 MED ORDER — PROMETHAZINE HCL 25 MG PO TABS
25.0000 mg | ORAL_TABLET | Freq: Every day | ORAL | Status: DC
  Administered 2016-09-25: 25 mg via ORAL
  Filled 2016-09-25: qty 1

## 2016-09-25 MED ORDER — CEFAZOLIN SODIUM-DEXTROSE 2-4 GM/100ML-% IV SOLN: 2.0000 g | INTRAVENOUS | Status: DC

## 2016-09-25 MED ORDER — BISACODYL 10 MG RE SUPP: 10.0000 mg | Freq: Every day | RECTAL | Status: DC | PRN

## 2016-09-25 MED ORDER — FENTANYL CITRATE (PF) 100 MCG/2ML IJ SOLN
INTRAMUSCULAR | Status: AC
  Filled 2016-09-25: qty 2

## 2016-09-25 MED ORDER — SODIUM CHLORIDE 0.9 % IJ SOLN
INTRAMUSCULAR | Status: AC
  Filled 2016-09-25: qty 50

## 2016-09-25 MED ORDER — PROPOFOL 10 MG/ML IV BOLUS
INTRAVENOUS | Status: AC
  Filled 2016-09-25: qty 20

## 2016-09-25 MED ORDER — BUPIVACAINE HCL (PF) 0.25 % IJ SOLN
INTRAMUSCULAR | Status: DC | PRN
  Administered 2016-09-25: 30 mL

## 2016-09-25 MED ORDER — ONDANSETRON HCL 4 MG/2ML IJ SOLN: 4.0000 mg | Freq: Four times a day (QID) | INTRAMUSCULAR | Status: DC | PRN

## 2016-09-25 MED ORDER — NITROGLYCERIN 0.3 MG SL SUBL
0.3000 mg | SUBLINGUAL_TABLET | SUBLINGUAL | Status: DC | PRN
Start: 2016-09-25 — End: 2016-09-26
  Filled 2016-09-25: qty 100

## 2016-09-25 MED ORDER — LACTATED RINGERS IV SOLN: INTRAVENOUS | Status: DC

## 2016-09-25 MED ORDER — DOCUSATE SODIUM 100 MG PO CAPS
100.0000 mg | ORAL_CAPSULE | Freq: Two times a day (BID) | ORAL | Status: DC
  Administered 2016-09-25 – 2016-09-26 (×2): 100 mg via ORAL
  Filled 2016-09-25 (×2): qty 1

## 2016-09-25 MED ORDER — MENTHOL 3 MG MT LOZG: 1.0000 | LOZENGE | OROMUCOSAL | Status: DC | PRN

## 2016-09-25 MED ORDER — MAGNESIUM CITRATE PO SOLN: 1.0000 | Freq: Once | ORAL | Status: DC | PRN

## 2016-09-25 MED ORDER — FLUDROCORTISONE ACETATE 0.1 MG PO TABS
100.0000 ug | ORAL_TABLET | Freq: Every day | ORAL | Status: DC
  Administered 2016-09-25: 100 ug via ORAL
  Filled 2016-09-25: qty 1

## 2016-09-25 MED ORDER — SODIUM CHLORIDE 0.9 % IR SOLN
Status: DC | PRN
  Administered 2016-09-25: 1000 mL

## 2016-09-25 MED ORDER — DIPHENHYDRAMINE HCL 25 MG PO CAPS: 25.0000 mg | ORAL_CAPSULE | Freq: Four times a day (QID) | ORAL | Status: DC | PRN

## 2016-09-25 MED ORDER — BUPIVACAINE HCL (PF) 0.25 % IJ SOLN
INTRAMUSCULAR | Status: AC
  Filled 2016-09-25: qty 30

## 2016-09-25 MED ORDER — ONDANSETRON HCL 4 MG PO TABS: 4.0000 mg | ORAL_TABLET | Freq: Four times a day (QID) | ORAL | Status: DC | PRN

## 2016-09-25 MED ORDER — POLYETHYLENE GLYCOL 3350 17 G PO PACK
17.0000 g | PACK | Freq: Two times a day (BID) | ORAL | Status: DC
  Administered 2016-09-25: 17 g via ORAL
  Filled 2016-09-25 (×2): qty 1

## 2016-09-25 MED ORDER — FLUTICASONE PROPIONATE 50 MCG/ACT NA SUSP
2.0000 | Freq: Every day | NASAL | Status: DC
  Administered 2016-09-25: 2 via NASAL
  Filled 2016-09-25: qty 16

## 2016-09-25 MED ORDER — DEXAMETHASONE SODIUM PHOSPHATE 10 MG/ML IJ SOLN
INTRAMUSCULAR | Status: AC
  Filled 2016-09-25: qty 1

## 2016-09-25 MED ORDER — POTASSIUM CHLORIDE CRYS ER 20 MEQ PO TBCR
20.0000 meq | EXTENDED_RELEASE_TABLET | Freq: Every day | ORAL | Status: DC
  Administered 2016-09-26: 20 meq via ORAL
  Filled 2016-09-25: qty 1

## 2016-09-25 MED ORDER — MONTELUKAST SODIUM 10 MG PO TABS
10.0000 mg | ORAL_TABLET | Freq: Every day | ORAL | Status: DC
  Administered 2016-09-25: 10 mg via ORAL
  Filled 2016-09-25: qty 1

## 2016-09-25 MED ORDER — ATORVASTATIN CALCIUM 20 MG PO TABS
40.0000 mg | ORAL_TABLET | Freq: Every evening | ORAL | Status: DC
  Administered 2016-09-25: 40 mg via ORAL
  Filled 2016-09-25: qty 2

## 2016-09-25 MED ORDER — MOMETASONE FURO-FORMOTEROL FUM 200-5 MCG/ACT IN AERO
2.0000 | INHALATION_SPRAY | Freq: Two times a day (BID) | RESPIRATORY_TRACT | Status: DC
  Administered 2016-09-25 – 2016-09-26 (×2): 2 via RESPIRATORY_TRACT
  Filled 2016-09-25: qty 8.8

## 2016-09-25 MED ORDER — ONDANSETRON HCL 4 MG/2ML IJ SOLN
INTRAMUSCULAR | Status: AC
  Filled 2016-09-25: qty 2

## 2016-09-25 MED ORDER — PREGABALIN 75 MG PO CAPS
75.0000 mg | ORAL_CAPSULE | Freq: Three times a day (TID) | ORAL | Status: DC
  Administered 2016-09-25 – 2016-09-26 (×3): 75 mg via ORAL
  Filled 2016-09-25 (×3): qty 1

## 2016-09-25 MED ORDER — LACTATED RINGERS IV SOLN
INTRAVENOUS | Status: DC | PRN
  Administered 2016-09-25: 08:00:00 via INTRAVENOUS

## 2016-09-25 MED ORDER — DEPLIN 7.5 MG PO TABS: 7.5000 mg | ORAL_TABLET | Freq: Every day | ORAL | Status: DC

## 2016-09-25 MED ORDER — STERILE WATER FOR IRRIGATION IR SOLN
Status: DC | PRN
  Administered 2016-09-25: 3000 mL

## 2016-09-25 MED ORDER — LIDOCAINE 5 % EX PTCH
2.0000 | MEDICATED_PATCH | CUTANEOUS | Status: DC | PRN
  Filled 2016-09-25: qty 2

## 2016-09-25 MED ORDER — VANCOMYCIN HCL IN DEXTROSE 1-5 GM/200ML-% IV SOLN
1000.0000 mg | Freq: Two times a day (BID) | INTRAVENOUS | Status: AC
  Administered 2016-09-25: 1000 mg via INTRAVENOUS
  Filled 2016-09-25: qty 200

## 2016-09-25 MED ORDER — EPHEDRINE 5 MG/ML INJ
INTRAVENOUS | Status: AC
  Filled 2016-09-25: qty 10

## 2016-09-25 MED ORDER — PHENOL 1.4 % MT LIQD
1.0000 | OROMUCOSAL | Status: DC | PRN
Start: 2016-09-25 — End: 2016-09-26
  Filled 2016-09-25: qty 177

## 2016-09-25 MED ORDER — NON FORMULARY: 40.0000 mg | Freq: Two times a day (BID) | Status: DC

## 2016-09-25 MED ORDER — KETOROLAC TROMETHAMINE 30 MG/ML IJ SOLN
INTRAMUSCULAR | Status: AC
  Filled 2016-09-25: qty 1

## 2016-09-25 MED ORDER — SODIUM CHLORIDE 0.9 % IJ SOLN
INTRAMUSCULAR | Status: DC | PRN
  Administered 2016-09-25: 30 mL

## 2016-09-25 MED ORDER — 0.9 % SODIUM CHLORIDE (POUR BTL) OPTIME
TOPICAL | Status: DC | PRN
  Administered 2016-09-25: 1000 mL

## 2016-09-25 MED ORDER — SUCRALFATE 1 GM/10ML PO SUSP
1.0000 g | Freq: Three times a day (TID) | ORAL | Status: DC
  Administered 2016-09-25 – 2016-09-26 (×3): 1 g via ORAL
  Filled 2016-09-25 (×3): qty 10

## 2016-09-25 MED ORDER — ALBUTEROL SULFATE (2.5 MG/3ML) 0.083% IN NEBU: 2.5000 mg | INHALATION_SOLUTION | Freq: Four times a day (QID) | RESPIRATORY_TRACT | Status: DC | PRN

## 2016-09-25 MED ORDER — MIDAZOLAM HCL 5 MG/5ML IJ SOLN
INTRAMUSCULAR | Status: DC | PRN
  Administered 2016-09-25: 2 mg via INTRAVENOUS

## 2016-09-25 MED ORDER — FAMOTIDINE IN NACL 20-0.9 MG/50ML-% IV SOLN
20.0000 mg | Freq: Once | INTRAVENOUS | Status: AC
  Administered 2016-09-25: 20 mg via INTRAVENOUS
  Filled 2016-09-25: qty 50

## 2016-09-25 MED ORDER — FENTANYL CITRATE (PF) 100 MCG/2ML IJ SOLN
25.0000 ug | INTRAMUSCULAR | Status: DC | PRN
  Administered 2016-09-25 (×2): 50 ug via INTRAVENOUS

## 2016-09-25 MED ORDER — ALBUTEROL SULFATE (2.5 MG/3ML) 0.083% IN NEBU: 3.0000 mL | INHALATION_SOLUTION | RESPIRATORY_TRACT | Status: DC | PRN

## 2016-09-25 MED ORDER — VANCOMYCIN HCL 10 G IV SOLR
1500.0000 mg | Freq: Once | INTRAVENOUS | Status: AC
  Administered 2016-09-25: 1500 mg via INTRAVENOUS
  Filled 2016-09-25: qty 1500

## 2016-09-25 MED ORDER — MIDAZOLAM HCL 2 MG/2ML IJ SOLN
INTRAMUSCULAR | Status: AC
  Filled 2016-09-25: qty 2

## 2016-09-25 MED ORDER — METOCLOPRAMIDE HCL 5 MG PO TABS: 5.0000 mg | ORAL_TABLET | Freq: Three times a day (TID) | ORAL | Status: DC | PRN

## 2016-09-25 MED ORDER — TRANEXAMIC ACID 1000 MG/10ML IV SOLN
1000.0000 mg | INTRAVENOUS | Status: AC
  Administered 2016-09-25: 1000 mg via INTRAVENOUS
  Filled 2016-09-25: qty 1100

## 2016-09-25 MED ORDER — KETOROLAC TROMETHAMINE 30 MG/ML IJ SOLN
INTRAMUSCULAR | Status: DC | PRN
  Administered 2016-09-25: 30 mg

## 2016-09-25 MED ORDER — TRIAMCINOLONE ACETONIDE 0.1 % EX CREA
1.0000 "application " | TOPICAL_CREAM | Freq: Two times a day (BID) | CUTANEOUS | Status: DC
  Filled 2016-09-25: qty 15

## 2016-09-25 MED ORDER — POTASSIUM CHLORIDE 2 MEQ/ML IV SOLN
INTRAVENOUS | Status: DC
  Administered 2016-09-25: 15:00:00 via INTRAVENOUS
  Filled 2016-09-25 (×4): qty 1000

## 2016-09-25 MED ORDER — ASPIRIN 81 MG PO CHEW
81.0000 mg | CHEWABLE_TABLET | Freq: Two times a day (BID) | ORAL | Status: DC
  Administered 2016-09-25 – 2016-09-26 (×2): 81 mg via ORAL
  Filled 2016-09-25 (×2): qty 1

## 2016-09-25 MED ORDER — HYDROMORPHONE HCL 1 MG/ML IJ SOLN
0.5000 mg | INTRAMUSCULAR | Status: DC | PRN
  Administered 2016-09-25 – 2016-09-26 (×5): 1 mg via INTRAVENOUS
  Filled 2016-09-25 (×6): qty 1

## 2016-09-25 MED ORDER — PROPOFOL 500 MG/50ML IV EMUL
INTRAVENOUS | Status: DC | PRN
  Administered 2016-09-25: 25 ug/kg/min via INTRAVENOUS

## 2016-09-25 MED ORDER — EPHEDRINE SULFATE-NACL 50-0.9 MG/10ML-% IV SOSY
PREFILLED_SYRINGE | INTRAVENOUS | Status: DC | PRN
  Administered 2016-09-25: 5 mg via INTRAVENOUS

## 2016-09-25 MED ORDER — HYDROCORTISONE 1 % EX CREA
1.0000 "application " | TOPICAL_CREAM | Freq: Two times a day (BID) | CUTANEOUS | Status: DC | PRN
  Filled 2016-09-25: qty 28

## 2016-09-25 MED ORDER — METOCLOPRAMIDE HCL 5 MG/ML IJ SOLN: 5.0000 mg | Freq: Three times a day (TID) | INTRAMUSCULAR | Status: DC | PRN

## 2016-09-25 MED ORDER — FENTANYL CITRATE (PF) 100 MCG/2ML IJ SOLN
INTRAMUSCULAR | Status: AC
  Administered 2016-09-25: 50 ug via INTRAVENOUS
  Filled 2016-09-25: qty 2

## 2016-09-25 MED ORDER — DEXAMETHASONE SODIUM PHOSPHATE 10 MG/ML IJ SOLN
10.0000 mg | Freq: Once | INTRAMUSCULAR | Status: AC
Start: 2016-09-25 — End: 2016-09-25
  Administered 2016-09-25: 10 mg via INTRAVENOUS

## 2016-09-25 MED ORDER — METOPROLOL TARTRATE 50 MG PO TABS
50.0000 mg | ORAL_TABLET | Freq: Two times a day (BID) | ORAL | Status: DC
  Administered 2016-09-25 – 2016-09-26 (×2): 50 mg via ORAL
  Filled 2016-09-25 (×2): qty 1

## 2016-09-25 MED ORDER — ALUM & MAG HYDROXIDE-SIMETH 200-200-20 MG/5ML PO SUSP
30.0000 mL | ORAL | Status: DC | PRN
  Filled 2016-09-25 (×2): qty 30

## 2016-09-25 MED ORDER — DEXAMETHASONE SODIUM PHOSPHATE 10 MG/ML IJ SOLN
10.0000 mg | Freq: Once | INTRAMUSCULAR | Status: AC
  Administered 2016-09-26: 10 mg via INTRAVENOUS
  Filled 2016-09-25: qty 1

## 2016-09-25 MED ORDER — ISOSORBIDE MONONITRATE ER 30 MG PO TB24
60.0000 mg | ORAL_TABLET | Freq: Every day | ORAL | Status: DC
  Administered 2016-09-26: 60 mg via ORAL
  Filled 2016-09-25: qty 2

## 2016-09-25 MED ORDER — ROPIVACAINE HCL 7.5 MG/ML IJ SOLN
INTRAMUSCULAR | Status: AC
  Filled 2016-09-25: qty 20

## 2016-09-25 MED ORDER — METHOCARBAMOL 1000 MG/10ML IJ SOLN
500.0000 mg | Freq: Four times a day (QID) | INTRAMUSCULAR | Status: DC | PRN
  Administered 2016-09-25: 500 mg via INTRAVENOUS
  Filled 2016-09-25: qty 5
  Filled 2016-09-25: qty 550

## 2016-09-25 MED ORDER — FERROUS SULFATE 325 (65 FE) MG PO TABS
325.0000 mg | ORAL_TABLET | Freq: Three times a day (TID) | ORAL | Status: DC
  Administered 2016-09-25: 325 mg via ORAL
  Filled 2016-09-25: qty 1

## 2016-09-25 MED ORDER — ESOMEPRAZOLE MAGNESIUM 40 MG PO CPDR
40.0000 mg | DELAYED_RELEASE_CAPSULE | Freq: Two times a day (BID) | ORAL | Status: DC
  Administered 2016-09-25: 40 mg via ORAL
  Filled 2016-09-25 (×3): qty 1

## 2016-09-25 MED ORDER — OXYCODONE HCL 5 MG PO TABS
30.0000 mg | ORAL_TABLET | ORAL | Status: DC
  Administered 2016-09-25 – 2016-09-26 (×6): 30 mg via ORAL
  Filled 2016-09-25 (×6): qty 6

## 2016-09-25 MED ORDER — CHLORHEXIDINE GLUCONATE 4 % EX LIQD: 60.0000 mL | Freq: Once | CUTANEOUS | Status: DC

## 2016-09-25 MED ORDER — LIDOCAINE 2% (20 MG/ML) 5 ML SYRINGE
INTRAMUSCULAR | Status: DC | PRN
  Administered 2016-09-25: 100 mg via INTRAVENOUS

## 2016-09-25 MED ORDER — ONDANSETRON HCL 4 MG/2ML IJ SOLN
INTRAMUSCULAR | Status: DC | PRN
  Administered 2016-09-25: 4 mg via INTRAVENOUS

## 2016-09-25 SURGICAL SUPPLY — 49 items
ADH SKN CLS APL DERMABOND .7 (GAUZE/BANDAGES/DRESSINGS) ×1
BAG DECANTER FOR FLEXI CONT (MISCELLANEOUS) IMPLANT
BAG SPEC THK2 15X12 ZIP CLS (MISCELLANEOUS)
BAG ZIPLOCK 12X15 (MISCELLANEOUS) IMPLANT
BANDAGE ACE 6X5 VEL STRL LF (GAUZE/BANDAGES/DRESSINGS) ×3 IMPLANT
BLADE SAW SGTL 13.0X1.19X90.0M (BLADE) ×3 IMPLANT
BONE CEMENT GENTAMICIN (Cement) ×6 IMPLANT
BOWL SMART MIX CTS (DISPOSABLE) ×3 IMPLANT
CAPT KNEE TOTAL 3 ATTUNE ×2 IMPLANT
CEMENT BONE GENTAMICIN 40 (Cement) IMPLANT
CLOTH BEACON ORANGE TIMEOUT ST (SAFETY) ×3 IMPLANT
CUFF TOURN SGL QUICK 34 (TOURNIQUET CUFF) ×3
CUFF TRNQT CYL 34X4X40X1 (TOURNIQUET CUFF) ×1 IMPLANT
DECANTER SPIKE VIAL GLASS SM (MISCELLANEOUS) ×1 IMPLANT
DERMABOND ADVANCED (GAUZE/BANDAGES/DRESSINGS) ×2
DERMABOND ADVANCED .7 DNX12 (GAUZE/BANDAGES/DRESSINGS) ×1 IMPLANT
DRAPE U-SHAPE 47X51 STRL (DRAPES) ×3 IMPLANT
DRESSING AQUACEL AG SP 3.5X10 (GAUZE/BANDAGES/DRESSINGS) ×1 IMPLANT
DRSG AQUACEL AG ADV 3.5X10 (GAUZE/BANDAGES/DRESSINGS) ×2 IMPLANT
DRSG AQUACEL AG SP 3.5X10 (GAUZE/BANDAGES/DRESSINGS) ×3
DURAPREP 26ML APPLICATOR (WOUND CARE) ×6 IMPLANT
ELECT REM PT RETURN 9FT ADLT (ELECTROSURGICAL) ×3
ELECTRODE REM PT RTRN 9FT ADLT (ELECTROSURGICAL) ×1 IMPLANT
GLOVE BIOGEL M 7.0 STRL (GLOVE) ×2 IMPLANT
GLOVE BIOGEL PI IND STRL 7.5 (GLOVE) ×1 IMPLANT
GLOVE BIOGEL PI IND STRL 8.5 (GLOVE) ×1 IMPLANT
GLOVE BIOGEL PI INDICATOR 7.5 (GLOVE) ×16
GLOVE BIOGEL PI INDICATOR 8.5 (GLOVE)
GLOVE ECLIPSE 8.0 STRL XLNG CF (GLOVE) ×1 IMPLANT
GLOVE ORTHO TXT STRL SZ7.5 (GLOVE) ×6 IMPLANT
GOWN STRL REUS W/TWL LRG LVL3 (GOWN DISPOSABLE) ×3 IMPLANT
GOWN STRL REUS W/TWL XL LVL3 (GOWN DISPOSABLE) ×3 IMPLANT
HANDPIECE INTERPULSE COAX TIP (DISPOSABLE) ×3
MANIFOLD NEPTUNE II (INSTRUMENTS) ×3 IMPLANT
PACK TOTAL KNEE CUSTOM (KITS) ×3 IMPLANT
POSITIONER SURGICAL ARM (MISCELLANEOUS) ×3 IMPLANT
SET HNDPC FAN SPRY TIP SCT (DISPOSABLE) ×1 IMPLANT
SET PAD KNEE POSITIONER (MISCELLANEOUS) ×3 IMPLANT
SUT MNCRL AB 4-0 PS2 18 (SUTURE) ×3 IMPLANT
SUT VIC AB 1 CT1 36 (SUTURE) ×3 IMPLANT
SUT VIC AB 2-0 CT1 27 (SUTURE) ×9
SUT VIC AB 2-0 CT1 TAPERPNT 27 (SUTURE) ×3 IMPLANT
SUT VLOC 180 0 24IN GS25 (SUTURE) ×3 IMPLANT
SYR 50ML LL SCALE MARK (SYRINGE) ×3 IMPLANT
TRAY FOLEY CATH 14FR (SET/KITS/TRAYS/PACK) ×2 IMPLANT
TRAY FOLEY W/METER SILVER 16FR (SET/KITS/TRAYS/PACK) ×1 IMPLANT
WATER STERILE IRR 1500ML POUR (IV SOLUTION) ×3 IMPLANT
WRAP KNEE MAXI GEL POST OP (GAUZE/BANDAGES/DRESSINGS) ×3 IMPLANT
YANKAUER SUCT BULB TIP 10FT TU (MISCELLANEOUS) ×3 IMPLANT

## 2016-09-25 NOTE — Progress Notes (Signed)
AssistedDr.K. Jackson with left, ultrasound guided, adductor canal block. Side rails up, monitors on throughout procedure. See vital signs in flow sheet. Tolerated Procedure well.  

## 2016-09-25 NOTE — Anesthesia Postprocedure Evaluation (Signed)
Anesthesia Post Note  Patient: Shelby Anderson  Procedure(s) Performed: Procedure(s) (LRB): LEFT TOTAL KNEE ARTHROPLASTY (Left)  Patient location during evaluation: PACU Anesthesia Type: Spinal Level of consciousness: awake Pain management: satisfactory to patient Vital Signs Assessment: post-procedure vital signs reviewed and stable Respiratory status: spontaneous breathing Cardiovascular status: blood pressure returned to baseline Postop Assessment: no headache and spinal receding Anesthetic complications: no    Last Vitals:  Vitals:   09/25/16 1439 09/25/16 1545  BP: 137/67 125/71  Pulse: 76 71  Resp: 15 15  Temp: 36.9 C 36.4 C    Last Pain:  Vitals:   09/25/16 1632  TempSrc:   PainSc: [place]

## 2016-09-25 NOTE — Op Note (Signed)
NAME:  Shelby Anderson                      MEDICAL RECORD NO.:  [id number]                             FACILITY:  [facility]      PHYSICIAN:  Pietro Cassis. Alvan Dame, M.D.  DATE OF BIRTH:  1956-10-27      DATE OF PROCEDURE:  09/25/2016                                     OPERATIVE REPORT         PREOPERATIVE DIAGNOSIS:  Left knee osteoarthritis.      POSTOPERATIVE DIAGNOSIS:  Left knee osteoarthritis.      FINDINGS:  The patient was noted to have complete loss of cartilage and   bone-on-bone arthritis with associated osteophytes in all three compartments of   the knee with a significant synovitis and associated effusion.      PROCEDURE:  Left total knee replacement.      COMPONENTS USED:  DePuy Attune rotating platform posterior stabilized knee   system, a size 5N femur, 4 tibia, size 5 PS AOX insert, and 35 anatomic patellar   button.      SURGEON:  Pietro Cassis. Alvan Dame, M.D.      ASSISTANT:  Nehemiah Massed, PA-C.      ANESTHESIA:  Spinal.      SPECIMENS:  None.      COMPLICATION:  None.      DRAINS:  None.  EBL: <50cc      TOURNIQUET TIME:   Total Tourniquet Time Documented: Thigh (Left) - 28 minutes Total: Thigh (Left) - 28 minutes     The patient was stable to the recovery room.      INDICATION FOR PROCEDURE:  Shelby Anderson is a 60 y.o. female patient of   mine.  The patient had been seen, evaluated, and treated conservatively in the   office with medication, activity modification, and injections.  The patient had   radiographic changes of bone-on-bone arthritis with endplate sclerosis and osteophytes noted.      The patient failed conservative measures including medication, injections, and activity modification, and at this point was ready for more definitive measures.   Based on the radiographic changes and failed conservative measures, the patient   decided to proceed with total knee replacement.  Risks of infection,   DVT, component failure, need for revision  surgery, postop course, and   expectations were all   discussed and reviewed.  Consent was obtained for benefit of pain   relief.      PROCEDURE IN DETAIL:  The patient was brought to the operative theater.   Once adequate anesthesia, preoperative antibiotics, 2 gm of Ancef, 1 gm of Tranexamic Acid, and 10 mg of Decadron administered, the patient was positioned supine with the left thigh tourniquet placed.  The  left lower extremity was prepped and draped in sterile fashion.  A time-   out was performed identifying the patient, planned procedure, and   extremity.      The left lower extremity was placed in the [facility] leg holder.  The leg was   exsanguinated, tourniquet elevated to 250 mmHg.  A midline incision was   made followed by median parapatellar arthrotomy.  Following initial   exposure, attention was first directed to the patella.  Precut   measurement was noted to be 22 mm.  I resected down to 14 mm and used a   35 anatomic patellar button to restore patellar height as well as cover the cut   surface.      The lug holes were drilled and a metal shim was placed to protect the   patella from retractors and saw blades.      At this point, attention was now directed to the femur.  The femoral   canal was opened with a drill, irrigated to try to prevent fat emboli.  An   intramedullary rod was passed at 3 degrees valgus, 9 mm of bone was   resected off the distal femur.  Following this resection, the tibia was   subluxated anteriorly.  Using the extramedullary guide, 2 mm of bone was resected off   the proximal medial tibia.  We confirmed the gap would be   stable medially and laterally with a size 5 spacer block as well as confirmed   the cut was perpendicular in the coronal plane, checking with an alignment rod.      Once this was done, I sized the femur to be a size 5 in the anterior-   posterior dimension, chose a narrow component based on medial and   lateral dimension.  The  size 5 rotation block was then pinned in   position anterior referenced using the C-clamp to set rotation.  The   anterior, posterior, and  chamfer cuts were made without difficulty nor   notching making certain that I was along the anterior cortex to help   with flexion gap stability.      The final box cut was made off the lateral aspect of distal femur.      At this point, the tibia was sized to be a size 4, the size 4 tray was   then pinned in position through the medial third of the tubercle,   drilled, and keel punched.  Trial reduction was now carried with a 5 femur,  4 tibia, a size 5 insert, and the 35 anatomic patella botton.  The knee was brought to   extension, full extension with good flexion stability with the patella   tracking through the trochlea without application of pressure.  Given   all these findings the femoral lugs were drilled and then the trial components removed.  Final components were   opened and cement was mixed.  The knee was irrigated with normal saline   solution and pulse lavage.  The synovial lining was   then injected with 30 cc of 0.25% Marcaine with epinephrine and 1 cc of Toradol plus 30 cc of for a   total of 61 cc.      The knee was irrigated.  Final implants were then cemented onto clean and   dried cut surfaces of bone with the knee brought to extension with a size 5 trial insert.      Once the cement had fully cured, the excess cement was removed   throughout the knee.  I confirmed I was satisfied with the range of   motion and stability, and the final size 5 PS AOX insert was chosen.  It was   placed into the knee.      The tourniquet had been let down at 28 minutes.  No significant   hemostasis required.  The  extensor mechanism was then reapproximated using #1 Vicryl and #0 V-lock sutures with the knee   in flexion.  The   remaining wound was closed with 2-0 Vicryl and running 4-0 Monocryl.   The knee was cleaned, dried, dressed  sterilely using Dermabond and   Aquacel dressing.  The patient was then   brought to recovery room in stable condition, tolerating the procedure   well.   Please note that Physician Assistant, Nehemiah Massed, PA-C, was present for the entirety of the case, and was utilized for pre-operative positioning, peri-operative retractor management, general facilitation of the procedure.  He was also utilized for primary wound closure at the end of the case.              Pietro Cassis Alvan Dame, M.D.    09/25/2016 10:09 AM

## 2016-09-25 NOTE — Transfer of Care (Signed)
Immediate Anesthesia Transfer of Care Note  Patient: Shelby Anderson  Procedure(s) Performed: Procedure(s): LEFT TOTAL KNEE ARTHROPLASTY (Left)  Patient Location: PACU  Anesthesia Type:Spinal and MAC combined with regional for post-op pain  Level of Consciousness: awake, alert  and oriented  Airway & Oxygen Therapy: Patient Spontanous Breathing and Patient connected to face mask oxygen  Post-op Assessment: Report given to RN and Post -op Vital signs reviewed and stable  Post vital signs: Reviewed and stable  Last Vitals:  Vitals:   09/25/16 0837 09/25/16 0838  BP:    Pulse: (!) 47 (!) 46  Resp: 14 14  Temp:      Last Pain:  Vitals:   09/25/16 0557  TempSrc: Oral         Complications: No apparent anesthesia complications

## 2016-09-25 NOTE — Anesthesia Procedure Notes (Signed)
Anesthesia Regional Block:  Adductor canal block  Pre-Anesthetic Checklist: ,, timeout performed, Correct Patient, Correct Site, Correct Laterality, Correct Procedure, Correct Position, site marked, Risks and benefits discussed, at surgeon's request and post-op pain management  Laterality: Left  Prep: chloraprep       Needles:   Needle Type: Echogenic Stimulator Needle     Needle Length: 9cm 9 cm Needle Gauge: 22 and 22 G    Additional Needles:  Procedures: ultrasound guided (picture in chart) Adductor canal block Narrative:  Injection made incrementally with aspirations every 5 mL. Anesthesiologist: Lyndle Herrlich  Additional Notes: 20 cc .75% Ropivicaine

## 2016-09-25 NOTE — Discharge Instructions (Signed)

## 2016-09-25 NOTE — Interval H&P Note (Signed)
History and Physical Interval Note:  09/25/2016 7:20 AM  Shelby Anderson  has presented today for surgery, with the diagnosis of LEFT KNEE OA  The various methods of treatment have been discussed with the patient and family. After consideration of risks, benefits and other options for treatment, the patient has consented to  Procedure(s): LEFT TOTAL KNEE ARTHROPLASTY (Left) as a surgical intervention .  The patient's history has been reviewed, patient examined, no change in status, stable for surgery.  I have reviewed the patient's chart and labs.  Questions were answered to the patient's satisfaction.     Mauri Pole

## 2016-09-25 NOTE — Evaluation (Signed)
Physical Therapy Evaluation Patient Details Name: Shelby Anderson MRN: [id number] DOB: [birth year] Today's Date: 09/25/2016   History of Present Illness  Pt is a 60 year old female s/p L TKA with hx of MI, DM II, afib, HTN  Clinical Impression  Pt is s/p L TKA resulting in the deficits listed below (see PT Problem List).  Pt will benefit from skilled PT to increase their independence and safety with mobility to allow discharge to the venue listed below.  Pt reports a fall the day before surgery onto L LE and hitting posterior head, however states she feels okay at this time.  Pt plans to d/c home with family assist.     Follow Up Recommendations Home health PT    Equipment Recommendations  Rolling walker with 5" wheels    Recommendations for Other Services       Precautions / Restrictions Precautions Precautions: Fall;Knee Restrictions Weight Bearing Restrictions: No Other Position/Activity Restrictions: WBAT      Mobility  Bed Mobility Overal bed mobility: Needs Assistance Bed Mobility: Supine to Sit     Supine to sit: Min guard     General bed mobility comments: verbal cues for technique  Transfers Overall transfer level: Needs assistance Equipment used: Rolling walker (2 wheeled) Transfers: Sit to/from Stand Sit to Stand: Min guard         General transfer comment: verbal cues for UE and LE positioning  Ambulation/Gait Ambulation/Gait assistance: Min guard Ambulation Distance (Feet): 50 Feet Assistive device: Rolling walker (2 wheeled) Gait Pattern/deviations: Step-to pattern;Antalgic     General Gait Details: verbal cues for sequence, step length, RW positioning  Stairs            Wheelchair Mobility    Modified Rankin (Stroke Patients Only)       Balance Overall balance assessment:  (pt reports falls pre-sx due to L LE weakness/giving out)                                           Pertinent Vitals/Pain Pain  Assessment: 0-10 Pain Score: 5  Pain Location: L thigh Pain Descriptors / Indicators: Aching;Sore Pain Intervention(s): Limited activity within patient's tolerance;Monitored during session;Repositioned;Ice applied    Home Living Family/patient expects to be discharged to:: Private residence Living Arrangements: Spouse/significant other   Type of Home: House Home Access: Stairs to enter Entrance Stairs-Rails: None Entrance Stairs-Number of Steps: 2 and 1 Home Layout: One level Home Equipment: Cane - single point      Prior Function Level of Independence: Independent               Hand Dominance        Extremity/Trunk Assessment               Lower Extremity Assessment: LLE deficits/detail   LLE Deficits / Details: good quad contraction, knee flexion AROM 35* sitting EOB, pt states limited ROM presurgery     Communication   Communication: No difficulties  Cognition Arousal/Alertness: Awake/alert Behavior During Therapy: WFL for tasks assessed/performed Overall Cognitive Status: Within Functional Limits for tasks assessed                      General Comments      Exercises     Assessment/Plan    PT Assessment Patient needs continued PT services  PT Problem List Decreased strength;Decreased range  of motion;Decreased knowledge of use of DME;Pain;Decreased mobility;Decreased knowledge of precautions          PT Treatment Interventions Functional mobility training;Stair training;Gait training;DME instruction;Therapeutic activities;Therapeutic exercise;Patient/family education    PT Goals (Current goals can be found in the Care Plan section)  Acute Rehab PT Goals PT Goal Formulation: With patient Time For Goal Achievement: 09/28/16 Potential to Achieve Goals: Good    Frequency 7X/week   Barriers to discharge        Co-evaluation               End of Session Equipment Utilized During Treatment: Gait belt Activity Tolerance:  Patient tolerated treatment well Patient left: in chair;with call bell/phone within reach;with chair alarm set           Time: [id number] PT Time Calculation (min) (ACUTE ONLY): 13 min   Charges:   PT Evaluation $PT Eval Low Complexity: 1 Procedure     PT G Codes:        Damone Fancher,KATHrine E 09/25/2016, 4:22 PM Carmelia Bake, PT, DPT 09/25/2016 Pager: [phone]

## 2016-09-25 NOTE — Anesthesia Procedure Notes (Signed)
Spinal  Patient location during procedure: OR Preanesthetic Checklist Completed: patient identified, site marked, surgical consent, pre-op evaluation, timeout performed, IV checked, risks and benefits discussed and monitors and equipment checked Spinal Block Patient position: sitting Prep: DuraPrep Patient monitoring: heart rate, cardiac monitor, continuous pulse ox and blood pressure Approach: midline Location: L3-4 Injection technique: single-shot Needle Needle type: Sprotte  Needle gauge: 24 G Needle length: 9 cm Assessment Sensory level: T4 Additional Notes LLD x 26min Easy SAB

## 2016-09-26 LAB — CBC
HCT: 35.1 % — ABNORMAL LOW (ref 36.0–46.0)
HEMOGLOBIN: 11.3 g/dL — AB (ref 12.0–15.0)
MCH: 28.8 pg (ref 26.0–34.0)
MCHC: 32.2 g/dL (ref 30.0–36.0)
MCV: 89.5 fL (ref 78.0–100.0)
Platelets: 218 10*3/uL (ref 150–400)
RBC: 3.92 MIL/uL (ref 3.87–5.11)
RDW: 14 % (ref 11.5–15.5)
WBC: 12.5 10*3/uL — ABNORMAL HIGH (ref 4.0–10.5)

## 2016-09-26 LAB — BASIC METABOLIC PANEL
ANION GAP: 5 (ref 5–15)
BUN: 9 mg/dL (ref 6–20)
CHLORIDE: 108 mmol/L (ref 101–111)
CO2: 30 mmol/L (ref 22–32)
Calcium: 9 mg/dL (ref 8.9–10.3)
Creatinine, Ser: 0.59 mg/dL (ref 0.44–1.00)
GFR calc Af Amer: >60 mL/min (ref >60)
GFR calc non Af Amer: >60 mL/min (ref >60)
GLUCOSE: 172 mg/dL — AB (ref 65–99)
POTASSIUM: 4.1 mmol/L (ref 3.5–5.1)
SODIUM: 143 mmol/L (ref 135–145)

## 2016-09-26 MED ORDER — FERROUS SULFATE 325 (65 FE) MG PO TABS: 325.0000 mg | ORAL_TABLET | Freq: Three times a day (TID) | ORAL | 3 refills | Status: DC

## 2016-09-26 MED ORDER — POLYETHYLENE GLYCOL 3350 17 G PO PACK: 17.0000 g | PACK | Freq: Two times a day (BID) | ORAL | 0 refills | Status: DC

## 2016-09-26 MED ORDER — DOCUSATE SODIUM 100 MG PO CAPS: 100.0000 mg | ORAL_CAPSULE | Freq: Two times a day (BID) | ORAL | 0 refills | Status: DC

## 2016-09-26 MED ORDER — ASPIRIN 81 MG PO CHEW
81.0000 mg | CHEWABLE_TABLET | Freq: Two times a day (BID) | ORAL | 0 refills | Status: DC
Start: 2016-09-26 — End: 2017-03-11

## 2016-09-26 MED ORDER — METHOCARBAMOL 500 MG PO TABS: 500.0000 mg | ORAL_TABLET | Freq: Four times a day (QID) | ORAL | 0 refills | Status: DC | PRN

## 2016-09-26 NOTE — Care Management Note (Signed)
Case Management Note  Patient Details  Name: Shelby Anderson MRN: [id number] Date of Birth: 02/23/1956  Subjective/Objective: 60 y.o. F admitted 09/25/2016 for L TKA. Home Oxygen provided by [facility]. Added RW and 3n1 to DME as recommended by PT. Will need HHPT and pt has chosen [facility] to provide this service. No further CM needs at present.                    Action/Plan: Anticipate discharge after therapy today.    Expected Discharge Date:  09/26/16               Expected Discharge Plan:  [name]  In-House Referral:  NA  Discharge planning Services  CM Consult  Post Acute Care Choice:  Durable Medical Equipment, Home Health Choice offered to:  Patient  DME Arranged:  3-N-1, Walker rolling DME Agency:  [place]:  PT [place]:  [facility] (now [facility])  Status of Service:  Completed, signed off  If discussed at [employer] of Stay Meetings, dates discussed:    Additional Comments:  Delrae Sawyers, RN 09/26/2016, 10:12 AM

## 2016-09-26 NOTE — Progress Notes (Signed)
Physical Therapy Treatment Patient Details Name: LIBRADA BOTT MRN: [id number] DOB: 06-24-1956 Today's Date: 10/20/2016    History of Present Illness Pt is a 60 year old female s/p L TKA with hx of MI, DM II, afib, HTN    PT Comments    Pt performed ther ex in chair, given HEP. Pt's spouse was educated about stabilizing the RW when pt performs stairs to get into home. Pt feels ready to be d/c home, plans to begin HHPT tomorrow afternoon.  Follow Up Recommendations  Home health PT;Supervision for mobility/OOB     Equipment Recommendations  Rolling walker with 5" wheels    Recommendations for Other Services       Precautions / Restrictions Precautions Precautions: Fall;Knee Restrictions Weight Bearing Restrictions: No Other Position/Activity Restrictions: WBAT    Mobility  Bed Mobility                  Transfers                    Ambulation/Gait                 Stairs            Wheelchair Mobility    Modified Rankin (Stroke Patients Only)       Balance                                    Cognition Arousal/Alertness: Awake/alert Behavior During Therapy: WFL for tasks assessed/performed Overall Cognitive Status: Within Functional Limits for tasks assessed                      Exercises Total Joint Exercises Ankle Circles/Pumps: AROM;Both;10 reps Quad Sets: AROM;Left;10 reps Short Arc Quad: AROM;Left;10 reps Heel Slides: AAROM;Left;10 reps (self-assist with sheet) Hip ABduction/ADduction: AROM;Left;10 reps Goniometric ROM: lacking 8 degrees of knee ext long-sitting in recliner; 38 degrees knee flexion long-sitting in recliner     General Comments        Pertinent Vitals/Pain Pain Assessment: 0-10 Pain Score: 6  Pain Location: L thigh and knee Pain Descriptors / Indicators: Aching;Sore Pain Intervention(s): Limited activity within patient's tolerance;Monitored during session;Premedicated  before session;Repositioned    Home Living                      Prior Function            PT Goals (current goals can now be found in the care plan section) Progress towards PT goals: Progressing toward goals    Frequency    7X/week      PT Plan Current plan remains appropriate    Co-evaluation             End of Session   Activity Tolerance: Patient tolerated treatment well Patient left: in chair;with call bell/phone within reach;with chair alarm set     Time: [id number] PT Time Calculation (min) (ACUTE ONLY): 18 min  Charges:  $Therapeutic Exercise: 8-22 mins                    G Codes:      Dewitt Hoes Oct 20, 2016, 2:50 PM Dewitt Hoes, SPT

## 2016-09-26 NOTE — Evaluation (Signed)
Occupational Therapy Evaluation Patient Details Name: Shelby Anderson MRN: [id number] DOB: Sep 18, 1956 Today's Date: 09/26/2016    History of Present Illness Pt is a 60 year old female s/p L TKA with hx of MI, DM II, afib, HTN   Clinical Impression   Pt was admitted for the above sx. All education was completed. No further OT is needed at this time    Follow Up Recommendations  No OT follow up;Supervision/Assistance - 24 hour    Equipment Recommendations  3 in 1 bedside comode    Recommendations for Other Services       Precautions / Restrictions Precautions Precautions: Fall;Knee Restrictions Other Position/Activity Restrictions: WBAT      Mobility Bed Mobility         Supine to sit: Supervision        Transfers   Equipment used: Rolling walker (2 wheeled) Transfers: Sit to/from Stand Sit to Stand: Supervision              Balance                                            ADL Overall ADL's : Needs assistance/impaired     Grooming: Oral care;Supervision/safety;Standing       Lower Body Bathing: Minimal assistance;Sit to/from stand           Toilet Transfer: Min guard;Ambulation;Comfort height toilet;Grab bars;RW   Toileting- Water quality scientist and Hygiene: Min guard;Sit to/from stand         General ADL Comments: performed bathing in bathroom and pt used commode. She will benefit from 3:1 at home.  Husband will assist as needed.  Pt was not interested in AE. Reviewed knee precautions and side stepping through tight spaces as well as step stool to get into bed.  Pt will sponge bathe until she can step into the tub     Vision     Perception     Praxis      Pertinent Vitals/Pain Pain Score: 5  Pain Location: L thigh Pain Descriptors / Indicators: Aching Pain Intervention(s): Limited activity within patient's tolerance;Monitored during session;Premedicated before session;Repositioned     Hand  Dominance     Extremity/Trunk Assessment Upper Extremity Assessment Upper Extremity Assessment: Overall WFL for tasks assessed           Communication Communication Communication: No difficulties   Cognition Arousal/Alertness: Awake/alert Behavior During Therapy: WFL for tasks assessed/performed Overall Cognitive Status: Within Functional Limits for tasks assessed                     General Comments       Exercises       Shoulder Instructions      Home Living Family/patient expects to be discharged to:: Private residence Living Arrangements: Spouse/significant other (2 small grandchildren at home)                 Bathroom Shower/Tub: Tub/shower unit Shower/tub characteristics: Curtain Biochemist, clinical: Standard     Home Equipment: Radio producer - single point          Prior Functioning/Environment Level of Independence: Independent                 OT Problem List:     OT Treatment/Interventions:      OT Goals(Current goals can be found in the care plan section) Acute  Rehab OT Goals Patient Stated Goal: home OT Goal Formulation: All assessment and education complete, DC therapy  OT Frequency:     Barriers to D/C:            Co-evaluation              End of Session    Activity Tolerance: Patient tolerated treatment well Patient left: in chair;with call bell/phone within reach;with chair alarm set   Time: [phone] OT Time Calculation (min): 19 min Charges:  OT General Charges $OT Visit: 1 Procedure OT Evaluation $OT Eval Low Complexity: 1 Procedure G-Codes:    Howard Bunte 2016/10/07, 8:19 AM  Lesle Chris, OTR/L [phone] 10/07/16

## 2016-09-26 NOTE — Progress Notes (Signed)
     Subjective: 1 Day Post-Op Procedure(s) (LRB): LEFT TOTAL KNEE ARTHROPLASTY (Left)   Patient reports pain as mild, pain controlled.  No events throughout the night. A little nervous about doing stairs.  Ready to be discharged home.   Objective:   VITALS:   Vitals:   09/26/16 0040 09/26/16 0520  BP: 129/63 122/64  Pulse: 64 (!) 54  Resp: 16 16  Temp: 97.9 F (36.6 C) 97.8 F (36.6 C)    Dorsiflexion/Plantar flexion intact Incision: dressing C/D/I No cellulitis present Compartment soft  LABS  Recent Labs  09/26/16 0428  HGB 11.3*  HCT 35.1*  WBC 12.5*  PLT 218     Recent Labs  09/26/16 0428  NA 143  K 4.1  BUN 9  CREATININE 0.59  GLUCOSE 172*     Assessment/Plan: 1 Day Post-Op Procedure(s) (LRB): LEFT TOTAL KNEE ARTHROPLASTY (Left) Foley cath d/c'ed Advance diet Up with therapy D/C IV fluids Discharge home with home health  Follow up in 2 weeks at [facility]. Follow up with OLIN,Daylah Sayavong D in 2 weeks.  Contact information:  [facility] [address] [place] [id number]    Obese (BMI 30-39.9) Estimated body mass index is 35.63 kg/m as calculated from the following:   Height as of this encounter: 5\' 6"  (1.676 m).   Weight as of this encounter: 100.1 kg (220 lb 12 oz). Patient also counseled that weight may inhibit the healing process Patient counseled that losing weight will help with future health issues        [place] Pugh. Darlina Mccaughey   PAC  09/26/2016, 8:42 AM

## 2016-09-26 NOTE — Progress Notes (Signed)
Physical Therapy Treatment Patient Details Name: Shelby Anderson MRN: [id number] DOB: [birth year] Today's Date: 09/26/2016    History of Present Illness Pt is a 60 year old female s/p L TKA with hx of MI, DM II, afib, HTN    PT Comments    Pt ambulated in hallway and practiced safe stair technique. Pt reported increased pain during gait when attempting to straighten knee completely. Pt feels ready to d/c home with HHPT and plans for OPPT after next ortho MD appt. Plans to ambulate and provide HEP for pt this afternoon as schedule permits.  Follow Up Recommendations  Home health PT;Supervision for mobility/OOB     Equipment Recommendations  Rolling walker with 5" wheels    Recommendations for Other Services       Precautions / Restrictions Precautions Precautions: Fall;Knee Restrictions Weight Bearing Restrictions: No Other Position/Activity Restrictions: WBAT    Mobility  Bed Mobility               General bed mobility comments: in chair upon arrival  Transfers Overall transfer level: Needs assistance Equipment used: Rolling walker (2 wheeled) Transfers: Sit to/from Stand Sit to Stand: Supervision         General transfer comment: supervision for safety; no verbal cues required for safe technique  Ambulation/Gait Ambulation/Gait assistance: Min guard Ambulation Distance (Feet): 80 Feet Assistive device: Rolling walker (2 wheeled) Gait Pattern/deviations: Step-to pattern;Antalgic     General Gait Details: guarding for safety; pt demonstrated improved gait pattern and required no cues for safe technique   Stairs Stairs: Yes Stairs assistance: Min guard Stair Management: No rails;Step to pattern;Backwards;With walker Number of Stairs: 2 General stair comments: guarding for safety; verbal cues for sequencing and RW positioning; pt educated that she needs to have someone with her for her safety and to assist in stabilizing RW when performing  stairs  Wheelchair Mobility    Modified Rankin (Stroke Patients Only)       Balance                                    Cognition Arousal/Alertness: Awake/alert Behavior During Therapy: WFL for tasks assessed/performed Overall Cognitive Status: Within Functional Limits for tasks assessed                      Exercises      General Comments        Pertinent Vitals/Pain Pain Assessment: 0-10 Pain Score: 6  Pain Location: L thigh and knee Pain Descriptors / Indicators: Aching;Sore Pain Intervention(s): Limited activity within patient's tolerance;Monitored during session;Premedicated before session;Repositioned;Ice applied    Home Living                      Prior Function            PT Goals (current goals can now be found in the care plan section) Progress towards PT goals: Progressing toward goals    Frequency    7X/week      PT Plan Current plan remains appropriate    Co-evaluation             End of Session Equipment Utilized During Treatment: Gait belt Activity Tolerance: Patient tolerated treatment well Patient left: in chair;with call bell/phone within reach;with chair alarm set     Time: [id number] PT Time Calculation (min) (ACUTE ONLY): 18 min  Charges:  $Gait Training: 8-22  mins                    G Codes:      Dewitt Hoes Oct 01, 2016, 12:55 PM Dewitt Hoes, SPT

## 2016-09-27 DIAGNOSIS — I10 Essential (primary) hypertension: Secondary | ICD-10-CM | POA: Diagnosis not present

## 2016-09-27 DIAGNOSIS — I4891 Unspecified atrial fibrillation: Secondary | ICD-10-CM | POA: Diagnosis not present

## 2016-09-27 DIAGNOSIS — M797 Fibromyalgia: Secondary | ICD-10-CM | POA: Diagnosis not present

## 2016-09-27 DIAGNOSIS — E1140 Type 2 diabetes mellitus with diabetic neuropathy, unspecified: Secondary | ICD-10-CM | POA: Diagnosis not present

## 2016-09-27 DIAGNOSIS — I25119 Atherosclerotic heart disease of native coronary artery with unspecified angina pectoris: Secondary | ICD-10-CM | POA: Diagnosis not present

## 2016-09-27 DIAGNOSIS — Z7951 Long term (current) use of inhaled steroids: Secondary | ICD-10-CM | POA: Diagnosis not present

## 2016-09-27 DIAGNOSIS — E785 Hyperlipidemia, unspecified: Secondary | ICD-10-CM | POA: Diagnosis not present

## 2016-09-27 DIAGNOSIS — Z96652 Presence of left artificial knee joint: Secondary | ICD-10-CM | POA: Diagnosis not present

## 2016-09-27 DIAGNOSIS — Z471 Aftercare following joint replacement surgery: Secondary | ICD-10-CM | POA: Diagnosis not present

## 2016-09-27 DIAGNOSIS — J45909 Unspecified asthma, uncomplicated: Secondary | ICD-10-CM | POA: Diagnosis not present

## 2016-09-28 DIAGNOSIS — M797 Fibromyalgia: Secondary | ICD-10-CM | POA: Diagnosis not present

## 2016-09-28 DIAGNOSIS — Z471 Aftercare following joint replacement surgery: Secondary | ICD-10-CM | POA: Diagnosis not present

## 2016-09-28 DIAGNOSIS — I10 Essential (primary) hypertension: Secondary | ICD-10-CM | POA: Diagnosis not present

## 2016-09-28 DIAGNOSIS — I25119 Atherosclerotic heart disease of native coronary artery with unspecified angina pectoris: Secondary | ICD-10-CM | POA: Diagnosis not present

## 2016-09-28 DIAGNOSIS — Z96652 Presence of left artificial knee joint: Secondary | ICD-10-CM | POA: Diagnosis not present

## 2016-09-28 DIAGNOSIS — E1140 Type 2 diabetes mellitus with diabetic neuropathy, unspecified: Secondary | ICD-10-CM | POA: Diagnosis not present

## 2016-09-28 DIAGNOSIS — E785 Hyperlipidemia, unspecified: Secondary | ICD-10-CM | POA: Diagnosis not present

## 2016-09-28 DIAGNOSIS — I4891 Unspecified atrial fibrillation: Secondary | ICD-10-CM | POA: Diagnosis not present

## 2016-09-28 DIAGNOSIS — Z7951 Long term (current) use of inhaled steroids: Secondary | ICD-10-CM | POA: Diagnosis not present

## 2016-09-28 DIAGNOSIS — J45909 Unspecified asthma, uncomplicated: Secondary | ICD-10-CM | POA: Diagnosis not present

## 2016-10-01 DIAGNOSIS — I4891 Unspecified atrial fibrillation: Secondary | ICD-10-CM | POA: Diagnosis not present

## 2016-10-01 DIAGNOSIS — Z471 Aftercare following joint replacement surgery: Secondary | ICD-10-CM | POA: Diagnosis not present

## 2016-10-01 DIAGNOSIS — M797 Fibromyalgia: Secondary | ICD-10-CM | POA: Diagnosis not present

## 2016-10-01 DIAGNOSIS — Z96652 Presence of left artificial knee joint: Secondary | ICD-10-CM | POA: Diagnosis not present

## 2016-10-01 DIAGNOSIS — I25119 Atherosclerotic heart disease of native coronary artery with unspecified angina pectoris: Secondary | ICD-10-CM | POA: Diagnosis not present

## 2016-10-01 DIAGNOSIS — E785 Hyperlipidemia, unspecified: Secondary | ICD-10-CM | POA: Diagnosis not present

## 2016-10-01 DIAGNOSIS — I10 Essential (primary) hypertension: Secondary | ICD-10-CM | POA: Diagnosis not present

## 2016-10-01 DIAGNOSIS — J45909 Unspecified asthma, uncomplicated: Secondary | ICD-10-CM | POA: Diagnosis not present

## 2016-10-01 DIAGNOSIS — Z7951 Long term (current) use of inhaled steroids: Secondary | ICD-10-CM | POA: Diagnosis not present

## 2016-10-01 DIAGNOSIS — E1140 Type 2 diabetes mellitus with diabetic neuropathy, unspecified: Secondary | ICD-10-CM | POA: Diagnosis not present

## 2016-10-03 DIAGNOSIS — Z471 Aftercare following joint replacement surgery: Secondary | ICD-10-CM | POA: Diagnosis not present

## 2016-10-03 DIAGNOSIS — J45909 Unspecified asthma, uncomplicated: Secondary | ICD-10-CM | POA: Diagnosis not present

## 2016-10-03 DIAGNOSIS — E785 Hyperlipidemia, unspecified: Secondary | ICD-10-CM | POA: Diagnosis not present

## 2016-10-03 DIAGNOSIS — E1140 Type 2 diabetes mellitus with diabetic neuropathy, unspecified: Secondary | ICD-10-CM | POA: Diagnosis not present

## 2016-10-03 DIAGNOSIS — M797 Fibromyalgia: Secondary | ICD-10-CM | POA: Diagnosis not present

## 2016-10-03 DIAGNOSIS — I10 Essential (primary) hypertension: Secondary | ICD-10-CM | POA: Diagnosis not present

## 2016-10-03 DIAGNOSIS — Z7951 Long term (current) use of inhaled steroids: Secondary | ICD-10-CM | POA: Diagnosis not present

## 2016-10-03 DIAGNOSIS — I4891 Unspecified atrial fibrillation: Secondary | ICD-10-CM | POA: Diagnosis not present

## 2016-10-03 DIAGNOSIS — I25119 Atherosclerotic heart disease of native coronary artery with unspecified angina pectoris: Secondary | ICD-10-CM | POA: Diagnosis not present

## 2016-10-03 DIAGNOSIS — Z96652 Presence of left artificial knee joint: Secondary | ICD-10-CM | POA: Diagnosis not present

## 2016-10-03 NOTE — Discharge Summary (Signed)
Physician Discharge Summary  Patient ID: Shelby Anderson MRN: [id number] DOB/AGE: 1956/09/21 60 y.o.  Admit date: 09/25/2016 Discharge date: 09/26/2016   Procedures:  Procedure(s) (LRB): LEFT TOTAL KNEE ARTHROPLASTY (Left)  Attending Physician:  Dr. Paralee Cancel   Admission Diagnoses:   Left knee primary OA / pain  Discharge Diagnoses:  Principal Problem:   S/P left TKA Active Problems:   Obese  Past Medical History:  Diagnosis Date  . Anemia    "chronic"  . Angina   . Anxiety and depression   . Atrial fibrillation ([place])    h/o "AF w/frequent PVCs"  . Chronic back pain greater than 3 months duration    on chronic narcotics, treated at pain clinic  . Colon polyps    hyperplastic  . Coronary artery disease    Arrythmia, orthostatic hypotension, HLD, HTN; sees Dr. Einar Gip  . Difficult intubation    "TMJ & woke up when they were still cutting on me"  . Dysrhythmia    sees Dr. Einar Gip and a cardiologist at [facility]  . Esophageal stricture   . Fatty liver   . Fibroids   . Fibromyalgia    "in my legs"  . GERD (gastroesophageal reflux disease)    hx hiatal hernia, stricture and gastric ulcer  . Headache(784.0)   . Heart murmur   . Hiatal hernia   . History of loop recorder   . History of migraines    "dx'd when I was in my teens"  . Hyperlipemia   . Hypertension   . Mental disorder   . Mild episode of recurrent major depressive disorder ([place]) 12/06/2015  . Myocardial infarction 1980's & 1990;   sees Dr. Einar Gip  . Pneumonia   . PONV (postoperative nausea and vomiting)   . Recurrent upper respiratory infection (URI)   . Shortness of breath 11/20/11   "all the time", sees pulmonlogy, ? asthma  . Stenotic cervical os   . Stomach ulcer    "3 small; found in 05/2011"  . TMJ (dislocation of temporomandibular joint)   . Tuberculosis    + TB SKIN TEST  . Type 2 diabetes mellitus without complication, without long-term current use of insulin ([place]) 12/06/2015     HPI:    Shelby Anderson, 60 y.o. female, has a history of pain and functional disability in the right knee due to arthritis and has failed non-surgical conservative treatments for greater than 12 weeks to include NSAID's and/or analgesics, corticosteriod injections and activity modification.  Onset of symptoms was gradual, starting 20+ years ago with gradually worsening course since that time. The patient noted prior procedures on the knee to include  arthroscopy on the left knee(s).  Patient currently rates pain in the left knee(s) at 10 out of 10 with activity. Patient has night pain, worsening of pain with activity and weight bearing, pain that interferes with activities of daily living, pain with passive range of motion, crepitus and joint swelling.  Patient has evidence of periarticular osteophytes and joint space narrowing by imaging studies. There is no active infection.   Risks, benefits and expectations were discussed with the patient.  Risks including but not limited to the risk of anesthesia, blood clots, nerve damage, blood vessel damage, failure of the prosthesis, infection and up to and including death.  Patient understand the risks, benefits and expectations and wishes to proceed with surgery.   PCP: Lucretia Kern., DO   Discharged Condition: good  Hospital Course:  Patient underwent  the above stated procedure on 09/25/2016. Patient tolerated the procedure well and brought to the recovery room in good condition and subsequently to the floor.  POD #1 BP: 122/64 ; Pulse: 54 ; Temp: 97.8 F (36.6 C) ; Resp: 16 Patient reports pain as mild, pain controlled.  No events throughout the night. A little nervous about doing stairs.  Ready to be discharged home. Dorsiflexion/plantar flexion intact, incision: dressing C/D/I, no cellulitis present and compartment soft.   LABS  Basename    HGB     11.3  HCT     35.1    Discharge Exam: General appearance: alert, cooperative and no  distress Extremities: Homans sign is negative, no sign of DVT, no edema, redness or tenderness in the calves or thighs and no ulcers, gangrene or trophic changes  Disposition: Home with follow up in 2 weeks   Follow-up Information    Mauri Pole, MD. Schedule an appointment as soon as possible for a visit in 2 week(s).   Specialty:  Orthopedic Surgery Contact information: [address] [place] [address] [phone]        Inc[facility] - [facility] .   Why:  RW and 3n1 has been ordered and will be delivered to your hospital room prior to discharge.  Contact information: [address] [address] [place] .   Specialty:  Home Health Services Why:  HHPT has been ordered by your MD. A representative from the company will contact you within 24 hours following your discharge to arrange your initial visit.  Contact information: [address] [place] [address] [phone]           Discharge Instructions    Call MD / Call 911    Complete by:  As directed    If you experience chest pain or shortness of breath, CALL 911 and be transported to the hospital emergency room.  If you develope a fever above 101 F, pus (white drainage) or increased drainage or redness at the wound, or calf pain, call your surgeon's office.   Change dressing    Complete by:  As directed    Maintain surgical dressing until follow up in the clinic. If the edges start to pull up, may reinforce with tape. If the dressing is no longer working, may remove and cover with gauze and tape, but must keep the area dry and clean.  Call with any questions or concerns.   Constipation Prevention    Complete by:  As directed    Drink plenty of fluids.  Prune juice may be helpful.  You may use a stool softener, such as Colace (over the counter) 100 mg twice a day.  Use MiraLax (over the counter) for constipation as needed.   Diet - low  sodium heart healthy    Complete by:  As directed    Discharge instructions    Complete by:  As directed    Maintain surgical dressing until follow up in the clinic. If the edges start to pull up, may reinforce with tape. If the dressing is no longer working, may remove and cover with gauze and tape, but must keep the area dry and clean.  Follow up in 2 weeks at [facility]. Call with any questions or concerns.   Increase activity slowly as tolerated    Complete by:  As directed    Weight bearing as  tolerated with assist device (walker, cane, etc) as directed, use it as long as suggested by your surgeon or therapist, typically at least 4-6 weeks.   TED hose    Complete by:  As directed    Use stockings (TED hose) for 2 weeks on both leg(s).  You may remove them at night for sleeping.        Medication List    STOP taking these medications   aspirin 81 MG EC tablet Replaced by:  aspirin 81 MG chewable tablet   meloxicam 7.5 MG tablet Commonly known as:  MOBIC     TAKE these medications   albuterol 108 (90 Base) MCG/ACT inhaler Commonly known as:  PROVENTIL HFA;VENTOLIN HFA Inhale 2 puffs into the lungs every 4 (four) hours as needed. Shortness of breath   albuterol (2.5 MG/3ML) 0.083% nebulizer solution Commonly known as:  PROVENTIL Take 3 mLs (2.5 mg total) by nebulization every 6 (six) hours as needed for wheezing or shortness of breath (J45.40).   aspirin 81 MG chewable tablet Chew 1 tablet (81 mg total) by mouth 2 (two) times daily. Take for 4 weeks, then resume regular dose. Replaces:  aspirin 81 MG EC tablet   atorvastatin 40 MG tablet Commonly known as:  LIPITOR TAKE 1 TABLET BY MOUTH EVERY DAY What changed:  See the new instructions.   CALCIUM + D PO Take 1 tablet by mouth at bedtime.   Cinnamon 500 MG capsule Take 1,000 mg by mouth 2 (two) times daily.   clobetasol cream 0.05 % Commonly known as:  TEMOVATE Apply 1 application topically See admin  instructions. Apply to affected area one to two times a week as needed for rash.   DEPLIN 7.5 MG Tabs Take 7.5 mg by mouth daily.   docusate sodium 100 MG capsule Commonly known as:  COLACE Take 1 capsule (100 mg total) by mouth 2 (two) times daily.   esomeprazole 40 MG capsule Commonly known as:  NEXIUM TAKE 1 CAPSULE (40 MG TOTAL) BY MOUTH 2 (TWO) TIMES DAILY. What changed:  how much to take  how to take this  when to take this  additional instructions   eszopiclone 3 MG Tabs Generic drug:  Eszopiclone Take 3 mg by mouth at bedtime as needed (insomnia).   ferrous sulfate 325 (65 FE) MG tablet Take 1 tablet (325 mg total) by mouth 3 (three) times daily after meals.   fludrocortisone 0.1 MG tablet Commonly known as:  FLORINEF TAKE 1 TABLET BY MOUTH TWICE A DAY What changed:  See the new instructions.   fluticasone 50 MCG/ACT nasal spray Commonly known as:  FLONASE Place 2 sprays into both nostrils at bedtime.   glucosamine-chondroitin 500-400 MG tablet Take 1 tablet by mouth 3 (three) times daily.   hydrocortisone 2.5 % cream Apply 1 application topically 2 (two) times daily as needed. dermatitis   isosorbide mononitrate 60 MG 24 hr tablet Commonly known as:  IMDUR Take 1 tablet (60 mg total) by mouth daily.   lidocaine 5 % Commonly known as:  LIDODERM Place 2 patches onto the skin as needed. Remove & Discard patch within 12 hours or as directed by MD   LYRICA 75 MG capsule Generic drug:  pregabalin Take 75 mg by mouth 3 (three) times daily.   methocarbamol 500 MG tablet Commonly known as:  ROBAXIN Take 1 tablet (500 mg total) by mouth every 6 (six) hours as needed for muscle spasms.   metoprolol 50 MG tablet Commonly known  as:  LOPRESSOR TAKE 1 TABLET TWICE A DAY   mometasone-formoterol 200-5 MCG/ACT Aero Commonly known as:  DULERA Inhale 2 puffs into the lungs 2 (two) times daily.   montelukast 10 MG tablet Commonly known as:  SINGULAIR TAKE 1  TABLET (10 MG TOTAL) BY MOUTH AT BEDTIME.   multivitamin with minerals Tabs tablet Take 1 tablet by mouth daily.   nitroGLYCERIN 0.3 MG SL tablet Commonly known as:  NITROSTAT Place 0.3 mg under the tongue every 5 (five) minutes as needed for chest pain (as needed).   NONFORMULARY OR COMPOUNDED ITEM Estradiol .02% 1 ML Prefilled Applicator Sig: apply vaginally twice a week #90 Day Supply with 4 refills   ondansetron 4 MG tablet Commonly known as:  ZOFRAN Take 4 mg by mouth every 4 (four) hours as needed for nausea or vomiting.   OXYCODONE HCL PO Take 30 mg by mouth every 4 (four) hours as needed. What changed:  Another medication with the same name was removed. Continue taking this medication, and follow the directions you see here.   polyethylene glycol packet Commonly known as:  MIRALAX / GLYCOLAX Take 17 g by mouth 2 (two) times daily.   potassium chloride SA 20 MEQ tablet Commonly known as:  KLOR-CON M20 TAKE 1 TABLET (20 MEQ TOTAL) BY MOUTH DAILY. What changed:  how much to take  how to take this  when to take this  additional instructions   progesterone 200 MG capsule Commonly known as:  PROMETRIUM TAKE 1 CAPSULE BY MOUTH EVERY DAY FOR FIRST 12 DAYS OF MONTH   promethazine 25 MG tablet Commonly known as:  PHENERGAN TAKE 1 TABLET BY MOUTH AT BEDTIME AS NEEDED FOR NAUSEA AND VOMITING What changed:  See the new instructions.   sucralfate 1 GM/10ML suspension Commonly known as:  CARAFATE Take 10 mLs (1 g total) by mouth 4 (four) times daily -  with meals and at bedtime. Reported on 01/05/2016   triamcinolone cream 0.1 % Commonly known as:  KENALOG Apply 1 application topically 2 (two) times daily.   Vitamin B-12 5000 MCG Tbdp Take 500 mcg by mouth daily.        Signed: [place] Pugh. Teaghan Melrose   PA-C  10/03/2016, 9:52 AM

## 2016-10-05 ENCOUNTER — Emergency Department (HOSPITAL_COMMUNITY): Payer: Medicare Other

## 2016-10-05 ENCOUNTER — Encounter (HOSPITAL_COMMUNITY): Payer: Self-pay

## 2016-10-05 ENCOUNTER — Emergency Department (HOSPITAL_COMMUNITY)
Admission: EM | Admit: 2016-10-05 | Discharge: 2016-10-05 | Disposition: A | Discharge status: Home / Self Care | Payer: Medicare Other | Attending: Emergency Medicine | Admitting: Emergency Medicine

## 2016-10-05 DIAGNOSIS — Z7982 Long term (current) use of aspirin: Secondary | ICD-10-CM | POA: Diagnosis not present

## 2016-10-05 DIAGNOSIS — Z7951 Long term (current) use of inhaled steroids: Secondary | ICD-10-CM | POA: Diagnosis not present

## 2016-10-05 DIAGNOSIS — E1140 Type 2 diabetes mellitus with diabetic neuropathy, unspecified: Secondary | ICD-10-CM | POA: Diagnosis not present

## 2016-10-05 DIAGNOSIS — I252 Old myocardial infarction: Secondary | ICD-10-CM | POA: Insufficient documentation

## 2016-10-05 DIAGNOSIS — D649 Anemia, unspecified: Secondary | ICD-10-CM | POA: Diagnosis not present

## 2016-10-05 DIAGNOSIS — Z79899 Other long term (current) drug therapy: Secondary | ICD-10-CM | POA: Diagnosis not present

## 2016-10-05 DIAGNOSIS — I25119 Atherosclerotic heart disease of native coronary artery with unspecified angina pectoris: Secondary | ICD-10-CM | POA: Diagnosis not present

## 2016-10-05 DIAGNOSIS — J45909 Unspecified asthma, uncomplicated: Secondary | ICD-10-CM | POA: Diagnosis not present

## 2016-10-05 DIAGNOSIS — R0602 Shortness of breath: Secondary | ICD-10-CM | POA: Diagnosis not present

## 2016-10-05 DIAGNOSIS — E785 Hyperlipidemia, unspecified: Secondary | ICD-10-CM | POA: Diagnosis not present

## 2016-10-05 DIAGNOSIS — Z96652 Presence of left artificial knee joint: Secondary | ICD-10-CM | POA: Insufficient documentation

## 2016-10-05 DIAGNOSIS — M797 Fibromyalgia: Secondary | ICD-10-CM | POA: Diagnosis not present

## 2016-10-05 DIAGNOSIS — R071 Chest pain on breathing: Secondary | ICD-10-CM | POA: Diagnosis present

## 2016-10-05 DIAGNOSIS — I2510 Atherosclerotic heart disease of native coronary artery without angina pectoris: Secondary | ICD-10-CM | POA: Diagnosis not present

## 2016-10-05 DIAGNOSIS — R079 Chest pain, unspecified: Secondary | ICD-10-CM | POA: Diagnosis not present

## 2016-10-05 DIAGNOSIS — R0781 Pleurodynia: Secondary | ICD-10-CM | POA: Insufficient documentation

## 2016-10-05 DIAGNOSIS — I10 Essential (primary) hypertension: Secondary | ICD-10-CM | POA: Insufficient documentation

## 2016-10-05 DIAGNOSIS — I4891 Unspecified atrial fibrillation: Secondary | ICD-10-CM | POA: Diagnosis not present

## 2016-10-05 DIAGNOSIS — Z471 Aftercare following joint replacement surgery: Secondary | ICD-10-CM | POA: Diagnosis not present

## 2016-10-05 LAB — BASIC METABOLIC PANEL
ANION GAP: 5 (ref 5–15)
BUN: 12 mg/dL (ref 6–20)
CALCIUM: 8.7 mg/dL — AB (ref 8.9–10.3)
CO2: 32 mmol/L (ref 22–32)
Chloride: 101 mmol/L (ref 101–111)
Creatinine, Ser: 0.68 mg/dL (ref 0.44–1.00)
Glucose, Bld: 111 mg/dL — ABNORMAL HIGH (ref 65–99)
POTASSIUM: 3.4 mmol/L — AB (ref 3.5–5.1)
Sodium: 138 mmol/L (ref 135–145)

## 2016-10-05 LAB — CBC
HEMATOCRIT: 33.5 % — AB (ref 36.0–46.0)
HEMOGLOBIN: 10.5 g/dL — AB (ref 12.0–15.0)
MCH: 28.2 pg (ref 26.0–34.0)
MCHC: 31.3 g/dL (ref 30.0–36.0)
MCV: 90.1 fL (ref 78.0–100.0)
Platelets: 285 10*3/uL (ref 150–400)
RBC: 3.72 MIL/uL — AB (ref 3.87–5.11)
RDW: 14 % (ref 11.5–15.5)
WBC: 7.5 10*3/uL (ref 4.0–10.5)

## 2016-10-05 LAB — I-STAT TROPONIN, ED: TROPONIN I, POC: 0 ng/mL (ref 0.00–0.08)

## 2016-10-05 LAB — PROTIME-INR
INR: 1.04
Prothrombin Time: 13.6 seconds (ref 11.4–15.2)

## 2016-10-05 MED ORDER — HYDROMORPHONE HCL 1 MG/ML IJ SOLN
1.0000 mg | Freq: Once | INTRAMUSCULAR | Status: AC
  Administered 2016-10-05: 1 mg via INTRAVENOUS
  Filled 2016-10-05: qty 1

## 2016-10-05 MED ORDER — ONDANSETRON HCL 4 MG/2ML IJ SOLN
4.0000 mg | Freq: Once | INTRAMUSCULAR | Status: AC
  Administered 2016-10-05: 4 mg via INTRAVENOUS
  Filled 2016-10-05: qty 2

## 2016-10-05 MED ORDER — IOPAMIDOL (ISOVUE-370) INJECTION 76%
100.0000 mL | Freq: Once | INTRAVENOUS | Status: AC | PRN
  Administered 2016-10-05: 100 mL via INTRAVENOUS

## 2016-10-05 MED ORDER — DEXAMETHASONE SODIUM PHOSPHATE 10 MG/ML IJ SOLN
10.0000 mg | Freq: Once | INTRAMUSCULAR | Status: AC
  Administered 2016-10-05: 10 mg via INTRAVENOUS
  Filled 2016-10-05: qty 1

## 2016-10-05 NOTE — ED Triage Notes (Signed)
Pt presents to ED complaining of R sided chest pain that radiates to her R shoulder and flank. Pt also c/o SOB, diaphoresis, and nausea. Hx of 2 MIs. A&Ox4.

## 2016-10-05 NOTE — ED Notes (Signed)
On way back to room, pt mentioned some L calf pain last week. Recent knee surgery.

## 2016-10-05 NOTE — ED Notes (Signed)
Pt reports R cp since yesterday radiating to her R arm and R flank.  Pt reports hx of MIs in the past.  Pt recently had L total knee replacement.  Pt states pain in her chest is worse with inspiration.  Pt is A&Ox 4.

## 2016-10-05 NOTE — ED Provider Notes (Signed)
[facility] DEPT Provider Note   CSN: [id number] Arrival date & time: 10/05/16  1528     History   Chief Complaint Chief Complaint  Patient presents with  . Chest Pain    HPI Shelby Anderson is a 60 y.o. female.  She is a 10 day status post left total knee replacement. Last night, she developed sharp right-sided chest pain which is worse with deep breathing. There is associated dyspnea, nausea, diaphoresis. She also low-grade fever up to 100.8. She has not been coughing. She relates that when the therapists saw her, oxygen saturation was as low as 65%. She has treated this with her postoperative pain medication of oxycodone without relief. She rates her pain at 10/10. She also relates that she has had pain in the lateral aspect of her left calf starting last night. Since her knee replacement surgery, she has been on low-dose aspirin but no systemic anticoagulation. She does have history of a heart attack, but states that this does not feel like her heart attack felt. Other significant past history includes atrial fibrillation, GERD, hypertension, hyperlipidemia, and diabetes.   The history is provided by the patient.  Chest Pain      Past Medical History:  Diagnosis Date  . Anemia    "chronic"  . Angina   . Anxiety and depression   . Atrial fibrillation ([place])    h/o "AF w/frequent PVCs"  . Chronic back pain greater than 3 months duration    on chronic narcotics, treated at pain clinic  . Colon polyps    hyperplastic  . Coronary artery disease    Arrythmia, orthostatic hypotension, HLD, HTN; sees Dr. Einar Gip  . Difficult intubation    "TMJ & woke up when they were still cutting on me"  . Dysrhythmia    sees Dr. Einar Gip and a cardiologist at [facility]  . Esophageal stricture   . Fatty liver   . Fibroids   . Fibromyalgia    "in my legs"  . GERD (gastroesophageal reflux disease)    hx hiatal hernia, stricture and gastric ulcer  . Headache(784.0)   . Heart murmur    . Hiatal hernia   . History of loop recorder   . History of migraines    "dx'd when I was in my teens"  . Hyperlipemia   . Hypertension   . Mental disorder   . Mild episode of recurrent major depressive disorder ([place]) 12/06/2015  . Myocardial infarction 1980's & 1990;   sees Dr. Einar Gip  . Pneumonia   . PONV (postoperative nausea and vomiting)   . Recurrent upper respiratory infection (URI)   . Shortness of breath 11/20/11   "all the time", sees pulmonlogy, ? asthma  . Stenotic cervical os   . Stomach ulcer    "3 small; found in 05/2011"  . TMJ (dislocation of temporomandibular joint)   . Tuberculosis    + TB SKIN TEST  . Type 2 diabetes mellitus without complication, without long-term current use of insulin ([place]) 12/06/2015    Patient Active Problem List   Diagnosis Date Noted  . Obese 09/26/2016  . S/P left TKA 09/25/2016  . Preoperative clearance 08/01/2016  . Increased endometrial stripe thickness 06/27/2016  . Intramural leiomyoma of uterus 06/27/2016  . Mild episode of recurrent major depressive disorder ([place]) 12/06/2015  . Type 2 diabetes mellitus without complication, without long-term current use of insulin ([place]) 12/06/2015  . Hyperlipemia 11/03/2015  . Chronic respiratory failure ([place]) 09/15/2015  . HTN (  hypertension) 07/19/2015  . Arrhythmia 07/19/2015  . Orthostatic hypotension 07/19/2015  . GERD (gastroesophageal reflux disease) 07/19/2015  . Chronic back pain 07/19/2015  . Neuropathy ([place]) 07/19/2015  . Postmenopausal bleeding 03/25/2013  . Asthma 04/29/2012  . Coronary artery disease 11/20/2011    Past Surgical History:  Procedure Laterality Date  . ACHILLES TENDON REPAIR  1970's   left ankle  . ARTHROSCOPIC REPAIR ACL     left knee cap  . CARDIAC CATHETERIZATION     loop recorder  . CARPAL TUNNEL RELEASE  unknown   left hand  . LOOP RECORDER IMPLANT    . post ganglionectomy  1970's   "for migraine headaches"  . pouch string  [phone]   "did this  3 times (once w/each pregnancy)"  . TOTAL KNEE ARTHROPLASTY Left 09/25/2016   Procedure: LEFT TOTAL KNEE ARTHROPLASTY;  Surgeon: Paralee Cancel, MD;  Location: [facility] ORS;  Service: Orthopedics;  Laterality: Left;  . TUBAL LIGATION  1980's    OB History    Gravida Para Term Preterm AB Living   5 3     2 3    SAB TAB Ectopic Multiple Live Births   2               Home Medications    Prior to Admission medications   Medication Sig Start Date End Date Taking? Authorizing Provider  aspirin 81 MG chewable tablet Chew 1 tablet (81 mg total) by mouth 2 (two) times daily. Take for 4 weeks, then resume regular dose. 09/26/16  Yes Matthew Babish, PA-C  methocarbamol (ROBAXIN) 500 MG tablet Take 1 tablet (500 mg total) by mouth every 6 (six) hours as needed for muscle spasms. 09/26/16  Yes Danae Orleans, PA-C  Oxycodone HCl 10 MG TABS Take 10 mg by mouth daily at 6 (six) AM. Takes 10mg  along with the 20mg  to take a total of 30mg    Yes Historical Provider, MD  Oxycodone HCl 20 MG TABS Take 20 tablets by mouth daily at 6 (six) AM. Takes 20mg  along with the 10mg  to take a total of 30mg    Yes Historical Provider, MD  albuterol (PROVENTIL HFA;VENTOLIN HFA) 108 (90 Base) MCG/ACT inhaler Inhale 2 puffs into the lungs every 4 (four) hours as needed. Shortness of breath 09/19/16   Rigoberto Noel, MD  albuterol (PROVENTIL) (2.5 MG/3ML) 0.083% nebulizer solution Take 3 mLs (2.5 mg total) by nebulization every 6 (six) hours as needed for wheezing or shortness of breath (J45.40). 09/19/16   Javier Glazier, MD  atorvastatin (LIPITOR) 40 MG tablet TAKE 1 TABLET BY MOUTH EVERY DAY Patient taking differently: TAKE 1 TABLET BY MOUTH EVERY EVENING. 08/13/16   Lucretia Kern, DO  Calcium Carbonate-Vitamin D (CALCIUM + D PO) Take 1 tablet by mouth at bedtime.     Historical Provider, MD  Cinnamon 500 MG capsule Take 1,000 mg by mouth 2 (two) times daily.     Historical Provider, MD  clobetasol cream (TEMOVATE) [id number] % Apply 1  application topically See admin instructions. Apply to affected area one to two times a week as needed for rash.    Historical Provider, MD  Cyanocobalamin (VITAMIN B-12) 5000 MCG TBDP Take 500 mcg by mouth daily.    Historical Provider, MD  docusate sodium (COLACE) 100 MG capsule Take 1 capsule (100 mg total) by mouth 2 (two) times daily. 09/26/16   Danae Orleans, PA-C  esomeprazole (NEXIUM) 40 MG capsule TAKE 1 CAPSULE (40 MG TOTAL) BY MOUTH 2 (TWO)  TIMES DAILY. Patient taking differently: Take 40 mg by mouth 2 (two) times daily before a meal. TAKE 1 CAPSULE (40 MG TOTAL) BY MOUTH 2 (TWO) TIMES DAILY. 01/13/16   Irene Shipper, MD  Eszopiclone (ESZOPICLONE) 3 MG TABS Take 3 mg by mouth at bedtime as needed (insomnia).  01/01/11   Historical Provider, MD  ferrous sulfate 325 (65 FE) MG tablet Take 1 tablet (325 mg total) by mouth 3 (three) times daily after meals. 09/26/16   Danae Orleans, PA-C  fludrocortisone (FLORINEF) 0.1 MG tablet TAKE 1 TABLET BY MOUTH TWICE A DAY Patient taking differently: 1 tablet at bedtime 11/21/15   Lucretia Kern, DO  fluticasone (FLONASE) 50 MCG/ACT nasal spray Place 2 sprays into both nostrils at bedtime.    Historical Provider, MD  glucosamine-chondroitin 500-400 MG tablet Take 1 tablet by mouth 3 (three) times daily.    Historical Provider, MD  hydrocortisone 2.5 % cream Apply 1 application topically 2 (two) times daily as needed. dermatitis 07/26/16   Historical Provider, MD  isosorbide mononitrate (IMDUR) 60 MG 24 hr tablet Take 1 tablet (60 mg total) by mouth daily. 04/23/16   Lucretia Kern, DO  L-Methylfolate (DEPLIN) 7.5 MG TABS Take 7.5 mg by mouth daily.      Historical Provider, MD  lidocaine (LIDODERM) 5 % Place 2 patches onto the skin as needed. Remove & Discard patch within 12 hours or as directed by MD    Historical Provider, MD  LYRICA 75 MG capsule Take 75 mg by mouth 3 (three) times daily.  02/20/16   Historical Provider, MD  metoprolol (LOPRESSOR) 50 MG  tablet TAKE 1 TABLET TWICE A DAY 04/16/16   Lucretia Kern, DO  mometasone-formoterol (DULERA) 200-5 MCG/ACT AERO Inhale 2 puffs into the lungs 2 (two) times daily. 09/19/16   Rigoberto Noel, MD  montelukast (SINGULAIR) 10 MG tablet TAKE 1 TABLET (10 MG TOTAL) BY MOUTH AT BEDTIME. 08/15/16   Tammy S Parrett, NP  Multiple Vitamin (MULITIVITAMIN WITH MINERALS) TABS Take 1 tablet by mouth daily.      Historical Provider, MD  nitroGLYCERIN (NITROSTAT) 0.3 MG SL tablet Place 0.3 mg under the tongue every 5 (five) minutes as needed for chest pain (as needed).    Historical Provider, MD  NONFORMULARY OR COMPOUNDED ITEM Estradiol .02% 1 ML Prefilled Applicator Sig: apply vaginally twice a week #90 Day Supply with 4 refills 06/12/16   Terrance Mass, MD  ondansetron (ZOFRAN) 4 MG tablet Take 4 mg by mouth every 4 (four) hours as needed for nausea or vomiting.    Historical Provider, MD  OXYCODONE HCL PO Take 30 mg by mouth every 4 (four) hours as needed.    Historical Provider, MD  polyethylene glycol (MIRALAX / GLYCOLAX) packet Take 17 g by mouth 2 (two) times daily. 09/26/16   Danae Orleans, PA-C  potassium chloride SA (KLOR-CON M20) 20 MEQ tablet TAKE 1 TABLET (20 MEQ TOTAL) BY MOUTH DAILY. Patient taking differently: Take 20 mEq by mouth daily. TAKE 1 TABLET (20 MEQ TOTAL) BY MOUTH DAILY. 06/21/16   Lucretia Kern, DO  progesterone (PROMETRIUM) 200 MG capsule TAKE 1 CAPSULE BY MOUTH EVERY DAY FOR FIRST 12 DAYS OF MONTH 08/20/16   Terrance Mass, MD  promethazine (PHENERGAN) 25 MG tablet TAKE 1 TABLET BY MOUTH AT BEDTIME AS NEEDED FOR NAUSEA AND VOMITING Patient taking differently: TAKE 1 TABLET BY MOUTH EVERY NIGHT AT BEDTIME. TO PREVENT REFLUX. 08/17/16   Docia Chuck  Henrene Pastor, MD  sucralfate (CARAFATE) 1 GM/10ML suspension Take 10 mLs (1 g total) by mouth 4 (four) times daily -  with meals and at bedtime. Reported on 01/05/2016 01/13/16   Irene Shipper, MD  triamcinolone cream (KENALOG) 0.1 % Apply 1 application  topically 2 (two) times daily. 07/09/16   Lucretia Kern, DO    Family History Family History  Problem Relation Age of Onset  . Malignant hyperthermia Father   . Hypertension Father   . Heart disease Father   . Diabetes Father   . Cancer Father     skin  . Hypertension Mother   . Heart disease Mother   . Cancer Sister     CERVICAL  . Hypertension Sister   . Cancer Brother     MELANOMA  . Heart disease Maternal Grandmother   . Heart disease Maternal Grandfather   . Cancer Paternal Grandmother     ?   Marland Kitchen Heart disease Paternal Grandmother   . Heart disease Paternal Grandfather   . Cancer Brother     LUNG  . Diabetes Sister   . Hypertension Sister   . Heart disease Sister   . Cancer Sister   . Cancer Brother   . Anesthesia problems Neg Hx   . Hypotension Neg Hx   . Pseudochol deficiency Neg Hx     Social History Social History  Substance Use Topics  . Smoking status: Never Smoker  . Smokeless tobacco: Never Used  . Alcohol use No     Allergies   Lodine [etodolac]; Oxycontin [oxycodone hcl]; Penicillins; Aspirin; Darvocet [propoxyphene n-acetaminophen]; Nitroglycerin; Ultram [tramadol hcl]; and Valium   Review of Systems Review of Systems  Cardiovascular: Positive for chest pain.  All other systems reviewed and are negative.    Physical Exam Updated Vital Signs There were no vitals taken for this visit.  Physical Exam  Nursing note and vitals reviewed.  60 year old female, resting comfortably and in no acute distress. Vital signs are normal. Oxygen saturation is 94%, which is normal. Head is normocephalic and atraumatic. PERRLA, EOMI. Oropharynx is clear. Neck is nontender and supple without adenopathy or JVD. Back is nontender and there is no CVA tenderness. Lungs are clear without rales, wheezes, or rhonchi. Chest is nontender. Heart has regular rate and rhythm without murmur. Abdomen is soft, flat, nontender without masses or hepatosplenomegaly and  peristalsis is normoactive. Extremities: Left knee has a dressing over the incision which is not removed. There is moderate swelling of the knee and left calf to a degree that would be expected at this stage postoperatively. There is some localized tenderness over the lateral aspect of the left calf. Homans sign is negative. Skin is warm and dry without rash. Neurologic: Mental status is normal, cranial nerves are intact, there are no motor or sensory deficits.  ED Treatments / Results  Labs (all labs ordered are listed, but only abnormal results are displayed) Labs Reviewed  BASIC METABOLIC PANEL - Abnormal; Notable for the following:       Result Value   Potassium 3.4 (*)    Glucose, Bld 111 (*)    Calcium 8.7 (*)    All other components within normal limits  CBC - Abnormal; Notable for the following:    RBC 3.72 (*)    Hemoglobin 10.5 (*)    HCT 33.5 (*)    All other components within normal limits  PROTIME-INR  I-STAT TROPOININ, ED    EKG  EKG Interpretation  Date/Time:  Friday October 05 2016 15:35:56 EDT Ventricular Rate:  90 PR Interval:    QRS Duration: 105 QT Interval:  400 QTC Calculation: 490 R Axis:   49 Text Interpretation:  Sinus rhythm Baseline wander Nonspecific T wave abnormality No significant change since last tracing Confirmed by Ashok Cordia  MD, Lennette Bihari ([id number]) on 10/05/2016 3:41:18 PM Also confirmed by Ashok Cordia  MD, Lennette Bihari ([id number]), editor [place], Leda Gauze [phone])  on 10/05/2016 4:08:39 PM       Radiology Dg Chest 2 View  Result Date: 10/05/2016 CLINICAL DATA:  Shortness of breath and chest pain for 2 days EXAM: CHEST  2 VIEW COMPARISON:  August 01, 2016 FINDINGS: There is no edema or consolidation. Heart is upper normal in size with pulmonary vascularity within normal limits. No adenopathy. A loop recorder is noted on the left anteriorly. No bone lesions. No pneumothorax. IMPRESSION: No edema or consolidation. Electronically Signed   By: Lowella Grip III  M.D.   On: 10/05/2016 16:11   Ct Angio Chest Pe W And/or Wo Contrast  Result Date: 10/05/2016 CLINICAL DATA:  Chest pain EXAM: CT ANGIOGRAPHY CHEST WITH CONTRAST TECHNIQUE: Multidetector CT imaging of the chest was performed using the standard protocol during bolus administration of intravenous contrast. Multiplanar CT image reconstructions and MIPs were obtained to evaluate the vascular anatomy. CONTRAST:  100 mL Isovue 370. COMPARISON:  Chest x-ray from earlier in the same day FINDINGS: Cardiovascular: The thoracic aorta and its branches are within normal limits. Mild coronary calcifications are seen. The pulmonary artery is within normal limits. No pulmonary emboli are seen. Mediastinum/Nodes: Thoracic inlet shows evidence of a 9 mm hypodensity within the right lobe of the thyroid. No lymphadenopathy is seen. No significant mediastinal or hilar adenopathy is seen. No axillary adenopathy is noted. Lungs/Pleura: Minimal right pleural effusion is noted. No focal infiltrate is noted. Upper Abdomen: No acute abnormality. Musculoskeletal: No acute abnormality noted. Review of the MIP images confirms the above findings. IMPRESSION: No evidence of pulmonary emboli. 9 mm hypodense nodule in the right lobe of the thyroid. No other focal abnormality is noted. Electronically Signed   By: Inez Catalina M.D.   On: 10/05/2016 18:28    Procedures Procedures (including critical care time)  Medications Ordered in ED Medications  HYDROmorphone (DILAUDID) injection 1 mg (not administered)  dexamethasone (DECADRON) injection 10 mg (not administered)  ondansetron (ZOFRAN) injection 4 mg (4 mg Intravenous Given 10/05/16 1732)  HYDROmorphone (DILAUDID) injection 1 mg (1 mg Intravenous Given 10/05/16 1732)  iopamidol (ISOVUE-370) 76 % injection 100 mL (100 mLs Intravenous Contrast Given 10/05/16 1806)     Initial Impression / Assessment and Plan / ED Course  I have reviewed the triage vital signs and the nursing  notes.  Pertinent labs & imaging results that were available during my care of the patient were reviewed by me and considered in my medical decision making (see chart for details).  Clinical Course   Patient with pleuritic chest pain, 10 days post total knee replacement. She is clearly at high risk for pulmonary embolism and will be sent for CT angiogram. ECG shows minor nonspecific T-wave changes and chest x-ray shows no obvious infiltrate. Old records are reviewed confirming recent left total knee arthroplasty.  CT has come back showing no evidence of pulmonary embolism or other pulmonary pathology. Hemoglobin has dropped slightly from preop values and is presumably secondary to blood loss during surgery. She did have an episode where her oxygen saturation dropped to the mid  80s while on room air. She was placed on oxygen and oxygen saturations have remained above 95% since then. At home, episodes of desaturation have corresponded to times of increased pain and are presumably secondary to decreased ventilation since there is no evidence of any intrinsic lung pathology. She does have home oxygen which she uses at night. At this point, I suspect viral pleurisy. She is given a single dose of dexamethasone in the ED and is advised to continue taking her home analgesics. Advised to supplement with acetaminophen if needed. She cannot tolerate NSAIDs because of history of gastric ulcer. She is advised to wear oxygen at all times for the next 3 days and then go back to nighttime use and when necessary use. Return for cautions discussed. Follow-up with PCP.  Final Clinical Impressions(s) / ED Diagnoses   Final diagnoses:  Pleuritic chest pain  Normochromic normocytic anemia    New Prescriptions New Prescriptions   No medications on file     Delora Fuel, MD [id number]

## 2016-10-05 NOTE — Discharge Instructions (Signed)
Continue taking your pain medication as needed. Please wear your oxygen at all times for the next 3 days. After that, he may go back to using it only at night if you are doing better. Return to the emergency department if symptoms are not being adequately managed at home, or are getting worse.

## 2016-10-09 ENCOUNTER — Other Ambulatory Visit: Payer: Self-pay | Admitting: Internal Medicine

## 2016-10-12 ENCOUNTER — Other Ambulatory Visit: Payer: Self-pay | Admitting: Family Medicine

## 2016-10-15 ENCOUNTER — Ambulatory Visit: Payer: Medicare Other | Admitting: Pulmonary Disease

## 2016-10-15 DIAGNOSIS — M179 Osteoarthritis of knee, unspecified: Secondary | ICD-10-CM | POA: Diagnosis not present

## 2016-10-15 DIAGNOSIS — M1712 Unilateral primary osteoarthritis, left knee: Secondary | ICD-10-CM | POA: Diagnosis not present

## 2016-10-15 DIAGNOSIS — Z79891 Long term (current) use of opiate analgesic: Secondary | ICD-10-CM | POA: Diagnosis not present

## 2016-10-15 DIAGNOSIS — M47816 Spondylosis without myelopathy or radiculopathy, lumbar region: Secondary | ICD-10-CM | POA: Diagnosis not present

## 2016-10-15 DIAGNOSIS — G894 Chronic pain syndrome: Secondary | ICD-10-CM | POA: Diagnosis not present

## 2016-10-19 ENCOUNTER — Encounter: Payer: Self-pay | Admitting: Adult Health

## 2016-10-19 ENCOUNTER — Ambulatory Visit (INDEPENDENT_AMBULATORY_CARE_PROVIDER_SITE_OTHER): Payer: Medicare Other | Admitting: Adult Health

## 2016-10-19 ENCOUNTER — Other Ambulatory Visit: Payer: Self-pay | Admitting: Family Medicine

## 2016-10-19 DIAGNOSIS — J4541 Moderate persistent asthma with (acute) exacerbation: Secondary | ICD-10-CM

## 2016-10-19 MED ORDER — PREDNISONE 10 MG PO TABS: ORAL_TABLET | ORAL | 0 refills | Status: DC

## 2016-10-19 MED ORDER — BENZONATATE 200 MG PO CAPS: 200.0000 mg | ORAL_CAPSULE | Freq: Three times a day (TID) | ORAL | 1 refills | Status: DC | PRN

## 2016-10-19 MED ORDER — LEVOFLOXACIN 500 MG PO TABS: 500.0000 mg | ORAL_TABLET | Freq: Every day | ORAL | 0 refills | Status: AC

## 2016-10-19 NOTE — Assessment & Plan Note (Signed)
Exacerbation with bronchitis  Would like to avoid steroids if possible with recent knee replacement . Steroid education given   Plan  Patient Instructions  Levaquin 500mg  daily for 7 days , take with food.  Mucinex DM Twice daily  As needed  Cough/congestion .  Prednisone taper to have on hold if wheezing does not resolve or worsens.  Use albuterol neb every 6hr as needed.  Follow up with Dr. Elsworth Soho  In 6-8 weeks and As needed   Please contact office for sooner follow up if symptoms do not improve or worsen or seek emergency care

## 2016-10-19 NOTE — Patient Instructions (Addendum)
Levaquin 500mg  daily for 7 days , take with food.  Mucinex DM Twice daily  As needed  Cough/congestion .  Prednisone taper to have on hold if wheezing does not resolve or worsens.  Use albuterol neb every 6hr as needed.  Follow up with Dr. Elsworth Soho  In 6-8 weeks and As needed   Please contact office for sooner follow up if symptoms do not improve or worsen or seek emergency care

## 2016-10-19 NOTE — Progress Notes (Signed)
Subjective:    Patient ID: Shelby Anderson, female    DOB: 11/22/1956, 60 y.o.   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  She was evaluated for CAD  at [facility] for syncope.& has a loop recorder - brady & int a - fibn   Significant tests/ events  Spirometry 12/2011 - no airway obstruction -No reversibility with bronchodilator , mod restriction Her husband smokes.. She has positive PPD.  CXR LLL nodular infx - resolved 04/2012  Home sleep test 07/2012 - showed desatn, but could not afford O2    Jan 2015 -admitted  asthma exacerbation, community-acquired pneumonia involving the right upper lobe and RSV bronchitis.  EGD in 03/2011 ([facility]) has shown peptic ulcer disease.  12/2013  barium esophagram - distal esophageal stricture.   stricture dilated ([place]) >> Restarted Singulair 04/2015   10/19/2016 Acute OV  : Asthma never smoker  Pt presents for an acute office visit.  Complains of 2 weeks of cough, congestion , initial low grade fever, wheezing and increased DOE. Was seen in ER on 11/3 , CTA Chest was neg for PE or acute process.  Says cough is worse this week with thick green brown mucus .  She is 3 weeks out from TKR on left . Says she did very well post op with no pulmonary issues. Has been going to PT doing well with knee recovery .  Remains on [place] , Singular . Takes regular.  Remains on O2 2l/m At bedtime  Had HST in 2013 that was neg.  No chest pain,  n/v/d or hemoptysis , calf pain or increased edema.      Past Medical History:  Diagnosis Date  . Anemia    "chronic"  . Angina   . Anxiety and depression   . Atrial fibrillation ([place])    h/o "AF w/frequent PVCs"  . Chronic back pain greater than 3 months duration    on chronic narcotics, treated at pain clinic  . Colon polyps    hyperplastic  . Coronary artery disease    Arrythmia, orthostatic hypotension, HLD, HTN; sees Dr. Einar Gip  . Difficult intubation    "TMJ & woke up when they were still cutting on me"  . Dysrhythmia    sees Dr. Einar Gip and a cardiologist at [facility]  . Esophageal stricture   . Fatty liver   . Fibroids   . Fibromyalgia    "in my legs"  . GERD (gastroesophageal reflux disease)    hx hiatal hernia, stricture and gastric ulcer  . Headache(784.0)   . Heart murmur   . Hiatal hernia   . History of loop recorder   . History of migraines    "dx'd when I was in my teens"  . Hyperlipemia   . Hypertension   . Mental disorder   . Mild episode of recurrent major depressive disorder ([place]) 12/06/2015  . Myocardial infarction 1980's & 1990;   sees Dr. Einar Gip  . Pneumonia   . PONV (postoperative nausea and vomiting)   . Recurrent upper respiratory infection (URI)   . Shortness of breath 11/20/11   "all the time", sees pulmonlogy, ? asthma  . Stenotic cervical os   . Stomach ulcer    "3 small; found in 05/2011"  . TMJ (dislocation of temporomandibular joint)   .  Tuberculosis    + TB SKIN TEST  . Type 2 diabetes mellitus without complication, without long-term current use of insulin ([place]) 12/06/2015   Current Outpatient Prescriptions on File Prior to Visit  Medication Sig Dispense Refill  . acetaminophen (TYLENOL) 500 MG tablet Take 1,000 mg by mouth every 4 (four) hours as needed for fever.    Marland Kitchen albuterol (PROVENTIL HFA;VENTOLIN HFA) 108 (90 Base) MCG/ACT inhaler Inhale 2 puffs into the lungs every 4 (four) hours as needed. Shortness of breath 1 Inhaler 3  . albuterol (PROVENTIL) (2.5 MG/3ML) 0.083% nebulizer solution Take 3 mLs (2.5 mg total) by nebulization every 6 (six) hours as needed for wheezing or shortness of breath (J45.40). 75 mL 3  . aspirin 81 MG chewable tablet Chew 1 tablet (81 mg total) by mouth 2 (two) times daily. Take for 4 weeks, then resume regular dose. 60 tablet 0  .  atorvastatin (LIPITOR) 40 MG tablet TAKE 1 TABLET BY MOUTH EVERY DAY (Patient taking differently: TAKE 1 TABLET BY MOUTH EVERY night at bedtime) 90 tablet 1  . Calcium Carb-Cholecalciferol (CALCIUM 600+D) 600-800 MG-UNIT TABS Take 1 tablet by mouth daily.    . Cinnamon 500 MG capsule Take 1,000 mg by mouth 2 (two) times daily.     . clobetasol cream (TEMOVATE) [id number] % Apply 1 application topically See admin instructions. Apply to affected area one to two times a week as needed for rash.    . Cyanocobalamin (VITAMIN B-12) 5000 MCG TBDP Take 5,000 mcg by mouth daily.     Marland Kitchen docusate sodium (COLACE) 100 MG capsule Take 1 capsule (100 mg total) by mouth 2 (two) times daily. (Patient taking differently: Take 100-200 mg by mouth daily as needed for mild constipation. ) 10 capsule 0  . esomeprazole (NEXIUM) 40 MG capsule TAKE 1 CAPSULE (40 MG TOTAL) BY MOUTH 2 (TWO) TIMES DAILY. (Patient taking differently: Take 40 mg by mouth 2 (two) times daily before a meal. TAKE 1 CAPSULE (40 MG TOTAL) BY MOUTH 2 (TWO) TIMES DAILY.) 60 capsule 11  . Eszopiclone (ESZOPICLONE) 3 MG TABS Take 3 mg by mouth at bedtime as needed (insomnia).     . fludrocortisone (FLORINEF) 0.1 MG tablet TAKE 1 TABLET BY MOUTH TWICE A DAY (Patient taking differently: 1 tablet at bedtime) 60 tablet 5  . fluticasone (FLONASE) 50 MCG/ACT nasal spray Place 2 sprays into both nostrils daily as needed for allergies or rhinitis.     . hydrocortisone 2.5 % cream Apply 1 application topically 2 (two) times daily as needed. dermatitis    . isosorbide mononitrate (IMDUR) 60 MG 24 hr tablet Take 1 tablet (60 mg total) by mouth daily. (Patient taking differently: Take 60 mg by mouth daily with breakfast. ) 90 tablet 1  . L-Methylfolate (DEPLIN) 7.5 MG TABS Take 7.5 mg by mouth daily with breakfast.     . lidocaine (LIDODERM) 5 % Place 2 patches onto the skin daily as needed (for pain). Remove & Discard patch within 12 hours or as directed by MD    . LYRICA 75  MG capsule Take 75 mg by mouth 3 (three) times daily.     . methocarbamol (ROBAXIN) 500 MG tablet Take 1 tablet (500 mg total) by mouth every 6 (six) hours as needed for muscle spasms. 60 tablet 0  . metoprolol (LOPRESSOR) 50 MG tablet TAKE 1 TABLET TWICE A DAY 180 tablet 1  . Misc Natural Products (GLUCOS-CHONDROIT-MSM COMPLEX PO) Take 2 tablets by mouth daily.    Marland Kitchen  mometasone-formoterol (DULERA) 200-5 MCG/ACT AERO Inhale 2 puffs into the lungs 2 (two) times daily. 1 Inhaler 4  . montelukast (SINGULAIR) 10 MG tablet TAKE 1 TABLET (10 MG TOTAL) BY MOUTH AT BEDTIME. 90 tablet 3  . Multiple Vitamin (MULITIVITAMIN WITH MINERALS) TABS Take 1 tablet by mouth daily.      . nitroGLYCERIN (NITROSTAT) 0.3 MG SL tablet Place 0.3 mg under the tongue every 5 (five) minutes as needed for chest pain (as needed).    . NONFORMULARY OR COMPOUNDED ITEM Estradiol .02% 1 ML Prefilled Applicator Sig: apply vaginally twice a week #90 Day Supply with 4 refills 1 each 4  . ondansetron (ZOFRAN) 4 MG tablet Take 4 mg by mouth every 4 (four) hours as needed for nausea or vomiting.    . Oxycodone HCl 10 MG TABS Take 10 mg by mouth every 4 (four) hours. Takes 10mg  along with the 20mg  to take a total of 30mg      . Oxycodone HCl 20 MG TABS Take 20 tablets by mouth every 4 (four) hours. Takes 20mg  along with the 10mg  to take a total of 30mg      . OXYGEN Inhale 2 L into the lungs at bedtime.    . polyethylene glycol (MIRALAX / GLYCOLAX) packet Take 17 g by mouth 2 (two) times daily. (Patient taking differently: Take 17 g by mouth daily as needed for mild constipation. ) 14 each 0  . potassium chloride SA (KLOR-CON M20) 20 MEQ tablet TAKE 1 TABLET (20 MEQ TOTAL) BY MOUTH DAILY. (Patient taking differently: Take 20 mEq by mouth daily. ) 30 tablet 3  . progesterone (PROMETRIUM) 200 MG capsule TAKE 1 CAPSULE BY MOUTH EVERY DAY FOR FIRST 12 DAYS OF MONTH 36 capsule 2  . promethazine (PHENERGAN) 25 MG tablet TAKE 1 TABLET BY MOUTH AT  BEDTIME AS NEEDED FOR NAUSEA AND VOMITING 30 tablet 1  . sucralfate (CARAFATE) 1 GM/10ML suspension Take 10 mLs (1 g total) by mouth 4 (four) times daily -  with meals and at bedtime. Reported on 01/05/2016 420 mL 6  . [DISCONTINUED] Potassium Chloride ER 20 MEQ TBCR Take 1 tablet by mouth daily. 30 tablet 3   No current facility-administered medications on file prior to visit.        Review of Systems  Constitutional:   No  weight loss, night sweats,  Fevers, chills,  +fatigue, or  lassitude.  HEENT:   No headaches,  Difficulty swallowing,  Tooth/dental problems, or  Sore throat,                No sneezing, itching, ear ache, + nasal congestion, post nasal drip,   CV:  No chest pain,  Orthopnea, PND, swelling in lower extremities, anasarca, dizziness, palpitations, syncope.   GI  No heartburn, indigestion, abdominal pain, nausea, vomiting, diarrhea, change in bowel habits, loss of appetite, bloody stools.   Resp:   No chest wall deformity  Skin: no rash or lesions.  GU: no dysuria, change in color of urine, no urgency or frequency.  No flank pain, no hematuria   MS:  No joint pain or swelling.  No decreased range of motion.  No back pain.  Psych:  No change in mood or affect. No depression or anxiety.  No memory loss.          Objective:   Physical Exam Vitals:   10/19/16 1038  BP: 100/60  Pulse: 62  Temp: 98.2 F (36.8 C)  TempSrc: Oral  SpO2: 95%  Weight: 219 lb 9.6 oz (99.6 kg)  Height: 5\' 6"  (1.676 m)  Body mass index is 35.44 kg/m.   Gen. Pleasant, obese, in no distress, walking with walker  ENT - no lesions, no post nasal drip Neck: No JVD, no thyromegaly, no carotid bruits Lungs: no use of accessory muscles, no dullness to percussion, few rhonchi w/ trace exp wheeze on forced exp. Speaking in full sentences.  Cardiovascular: Rhythm regular, heart sounds  normal, no murmurs or gallops, tr  peripheral edema Musculoskeletal: No deformities, no cyanosis or  clubbing , no tremors      CTA Chest 10/05/16   No evidence of pulmonary emboli., nad   Jernee Murtaugh NP-C  [facility]  10/19/2016

## 2016-10-22 DIAGNOSIS — M1712 Unilateral primary osteoarthritis, left knee: Secondary | ICD-10-CM | POA: Diagnosis not present

## 2016-10-23 DIAGNOSIS — J45909 Unspecified asthma, uncomplicated: Secondary | ICD-10-CM | POA: Diagnosis not present

## 2016-10-24 DIAGNOSIS — M1712 Unilateral primary osteoarthritis, left knee: Secondary | ICD-10-CM | POA: Diagnosis not present

## 2016-10-24 NOTE — Progress Notes (Signed)
Reviewed & agree with plan  

## 2016-10-31 ENCOUNTER — Other Ambulatory Visit: Payer: Self-pay | Admitting: Gynecology

## 2016-10-31 DIAGNOSIS — R9389 Abnormal findings on diagnostic imaging of other specified body structures: Secondary | ICD-10-CM

## 2016-11-02 DIAGNOSIS — M1712 Unilateral primary osteoarthritis, left knee: Secondary | ICD-10-CM | POA: Diagnosis not present

## 2016-11-06 DIAGNOSIS — M1712 Unilateral primary osteoarthritis, left knee: Secondary | ICD-10-CM | POA: Diagnosis not present

## 2016-11-07 DIAGNOSIS — Z96652 Presence of left artificial knee joint: Secondary | ICD-10-CM | POA: Diagnosis not present

## 2016-11-08 ENCOUNTER — Ambulatory Visit (INDEPENDENT_AMBULATORY_CARE_PROVIDER_SITE_OTHER): Payer: Medicare Other | Admitting: Family Medicine

## 2016-11-08 ENCOUNTER — Encounter: Payer: Self-pay | Admitting: Family Medicine

## 2016-11-08 VITALS — BP 122/88 | HR 87 | Temp 98.4°F | Ht 66.0 in | Wt 218.0 lb

## 2016-11-08 DIAGNOSIS — E785 Hyperlipidemia, unspecified: Secondary | ICD-10-CM | POA: Diagnosis not present

## 2016-11-08 DIAGNOSIS — I10 Essential (primary) hypertension: Secondary | ICD-10-CM | POA: Diagnosis not present

## 2016-11-08 DIAGNOSIS — J988 Other specified respiratory disorders: Secondary | ICD-10-CM

## 2016-11-08 DIAGNOSIS — Z0000 Encounter for general adult medical examination without abnormal findings: Secondary | ICD-10-CM | POA: Diagnosis not present

## 2016-11-08 DIAGNOSIS — R0981 Nasal congestion: Secondary | ICD-10-CM

## 2016-11-08 DIAGNOSIS — F320 Major depressive disorder, single episode, mild: Secondary | ICD-10-CM

## 2016-11-08 MED ORDER — DOXYCYCLINE HYCLATE 100 MG PO CAPS: 100.0000 mg | ORAL_CAPSULE | Freq: Two times a day (BID) | ORAL | 0 refills | Status: DC
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
## 2016-11-08 NOTE — Progress Notes (Signed)
HPI:  Shelby Anderson is a pleasant 60 yo with a very complicated PMH summarized below scheduled for follow up - but is upset because she wants her "physical." Reports Medicare send her a letter saying she has to get this before the end of the year.  Our wellness coordinator is actually able to work her in for her Medicare wellness exam today. See separate note.   She sees a number of specialists including several cardiologists, gastroenterologist, gynecologist, pulmonologist, pain management and orthopedist for management of many of her problems. She sees me primarily for her diabetes. She has a very poor diet and does not get any regular exercise. Recently on prednisone and abx with pulmonologist. She had a left total knee arthroplasty in October.  Her main concern today is that she has sinus congestion and thinks she has a sinus infection.She has had sinus congestion, cough, PND and wheezing for several weeks that improved with treatment with abx from her pulmonologist and now worsened with green and yellow sinus drainage and sinus pain. Just started on prednisone from her pulmonologist.   Summary health issues - reviewed and update:  Diabetes Mellitus: -a recent transfer to me, appears has likely been borderline/mild diabetic for some time -referred to diabetes educator  -no regular exercise, but she is active taking care of grandchildren -hx CAD (sees cardiologist), HLD, HTN, orthostattic hypotension - on statin and asa, not on acei - has asthma  GERD: -hx bad reflux, esophageal stricture s/p dilation and gastric ulcer -sees Dr. Henrene Pastor for this  -on nexium 40mg  bid, carafate, prn phenergan  CAD hx MI x2 per her report/Arrythmia/HTN/HLD/orthostatic hypotension/presyncope: -seeing several cardiologists -meds: asa, metoprolol, fish oil, isosorbide, florinef, decreased, K+, lipitor -denies: CP, SOB, palpitations  Asthma/Allergies: -sees pulmonologist for this -meds:  singulair -on review of chart recently had exacerbation and treated with steroids and abx and now another round of steroids  Insomnia: -uses benydrl occ for this -eszopiclone on med list from ? [facility] -hx of low O2 at night  Chronic back pain/Knee pain: -hx DDD and neuropathy, OA knees -seeing [facility] pain management specialist and referred to ortho -onchronic opiods an dmobic -aware of risks with medications, adamant about continuing mobic as feels is only medication that helps  MDD: -reports on deplin from pain management for this, does not feel helps -declined other treatments offered here in the past, declined psych eval or CBT -Chronic symptoms: mildly constant depressed mood and hopeless feelings at times, no SI, panic or manic symptoms  HRT: -on prometrium with her gynecologist, Dr. Toney Rakes  ROS: See pertinent positives and negatives per HPI.  Past Medical History:  Diagnosis Date  . Anemia    "chronic"  . Angina   . Anxiety and depression   . Atrial fibrillation ([place])    h/o "AF w/frequent PVCs"  . Chronic back pain greater than 3 months duration    on chronic narcotics, treated at pain clinic  . Colon polyps    hyperplastic  . Coronary artery disease    Arrythmia, orthostatic hypotension, HLD, HTN; sees Dr. Einar Gip  . Difficult intubation    "TMJ & woke up when they were still cutting on me"  . Dysrhythmia    sees Dr. Einar Gip and a cardiologist at [facility]  . Esophageal stricture   . Fatty liver   . Fibroids   . Fibromyalgia    "in my legs"  . GERD (gastroesophageal reflux disease)    hx hiatal hernia, stricture and gastric ulcer  .  Headache(784.0)   . Heart murmur   . Hiatal hernia   . History of loop recorder   . History of migraines    "dx'd when I was in my teens"  . Hyperlipemia   . Hypertension   . Mental disorder   . Mild episode of recurrent major depressive disorder ([place]) 12/06/2015  . Myocardial infarction 1980's & 1990;   sees Dr.  Einar Gip  . Pneumonia   . PONV (postoperative nausea and vomiting)   . Recurrent upper respiratory infection (URI)   . Shortness of breath 11/20/11   "all the time", sees pulmonlogy, ? asthma  . Stenotic cervical os   . Stomach ulcer    "3 small; found in 05/2011"  . TMJ (dislocation of temporomandibular joint)   . Tuberculosis    + TB SKIN TEST  . Type 2 diabetes mellitus without complication, without long-term current use of insulin ([place]) 12/06/2015    Past Surgical History:  Procedure Laterality Date  . ACHILLES TENDON REPAIR  1970's   left ankle  . ARTHROSCOPIC REPAIR ACL     left knee cap  . CARDIAC CATHETERIZATION     loop recorder  . CARPAL TUNNEL RELEASE  unknown   left hand  . LOOP RECORDER IMPLANT    . post ganglionectomy  1970's   "for migraine headaches"  . pouch string  [phone]   "did this 3 times (once w/each pregnancy)"  . TOTAL KNEE ARTHROPLASTY Left 09/25/2016   Procedure: LEFT TOTAL KNEE ARTHROPLASTY;  Surgeon: Paralee Cancel, MD;  Location: [facility];  Service: Orthopedics;  Laterality: Left;  . TUBAL LIGATION  1980's    Family History  Problem Relation Age of Onset  . Malignant hyperthermia Father   . Hypertension Father   . Heart disease Father   . Diabetes Father   . Cancer Father     skin  . Hypertension Mother   . Heart disease Mother   . Cancer Sister     CERVICAL  . Hypertension Sister   . Cancer Brother     MELANOMA  . Heart disease Maternal Grandmother   . Heart disease Maternal Grandfather   . Cancer Paternal Grandmother     ?   Marland Kitchen Heart disease Paternal Grandmother   . Heart disease Paternal Grandfather   . Cancer Brother     LUNG  . Diabetes Sister   . Hypertension Sister   . Heart disease Sister   . Cancer Sister   . Cancer Brother   . Anesthesia problems Neg Hx   . Hypotension Neg Hx   . Pseudochol deficiency Neg Hx     Social History   Social History  . Marital status: Married    Spouse name: N/A  . Number of children: 3   . Years of education: N/A   Occupational History  . Retired Therapist, sports    Social History Main Topics  . Smoking status: Never Smoker  . Smokeless tobacco: Never Used  . Alcohol use No  . Drug use: No  . Sexual activity: Yes    Birth control/ protection: Surgical   Other Topics Concern  . Not on file   Social History Narrative  . No narrative on file     Current Outpatient Prescriptions:  .  acetaminophen (TYLENOL) 500 MG tablet, Take 1,000 mg by mouth every 4 (four) hours as needed for fever., Disp: , Rfl:  .  albuterol (PROVENTIL HFA;VENTOLIN HFA) 108 (90 Base) MCG/ACT inhaler, Inhale 2 puffs into  the lungs every 4 (four) hours as needed. Shortness of breath, Disp: 1 Inhaler, Rfl: 3 .  albuterol (PROVENTIL) (2.5 MG/3ML) 0.083% nebulizer solution, Take 3 mLs (2.5 mg total) by nebulization every 6 (six) hours as needed for wheezing or shortness of breath (J45.40)., Disp: 75 mL, Rfl: 3 .  aspirin 81 MG chewable tablet, Chew 1 tablet (81 mg total) by mouth 2 (two) times daily. Take for 4 weeks, then resume regular dose., Disp: 60 tablet, Rfl: 0 .  atorvastatin (LIPITOR) 40 MG tablet, TAKE 1 TABLET BY MOUTH EVERY DAY (Patient taking differently: TAKE 1 TABLET BY MOUTH EVERY night at bedtime), Disp: 90 tablet, Rfl: 1 .  benzonatate (TESSALON) 200 MG capsule, Take 1 capsule (200 mg total) by mouth 3 (three) times daily as needed for cough., Disp: 30 capsule, Rfl: 1 .  Calcium Carb-Cholecalciferol (CALCIUM 600+D) 600-800 MG-UNIT TABS, Take 1 tablet by mouth daily., Disp: , Rfl:  .  Cinnamon 500 MG capsule, Take 1,000 mg by mouth 2 (two) times daily. , Disp: , Rfl:  .  clobetasol cream (TEMOVATE) [id number] %, Apply 1 application topically See admin instructions. Apply to affected area one to two times a week as needed for rash., Disp: , Rfl:  .  Cyanocobalamin (VITAMIN B-12) 5000 MCG TBDP, Take 5,000 mcg by mouth daily. , Disp: , Rfl:  .  docusate sodium (COLACE) 100 MG capsule, Take 1 capsule (100 mg  total) by mouth 2 (two) times daily. (Patient taking differently: Take 100-200 mg by mouth daily as needed for mild constipation. ), Disp: 10 capsule, Rfl: 0 .  esomeprazole (NEXIUM) 40 MG capsule, TAKE 1 CAPSULE (40 MG TOTAL) BY MOUTH 2 (TWO) TIMES DAILY. (Patient taking differently: Take 40 mg by mouth 2 (two) times daily before a meal. TAKE 1 CAPSULE (40 MG TOTAL) BY MOUTH 2 (TWO) TIMES DAILY.), Disp: 60 capsule, Rfl: 11 .  Eszopiclone (ESZOPICLONE) 3 MG TABS, Take 3 mg by mouth at bedtime as needed (insomnia). , Disp: , Rfl:  .  fludrocortisone (FLORINEF) 0.1 MG tablet, TAKE 1 TABLET BY MOUTH TWICE A DAY (Patient taking differently: 1 tablet at bedtime), Disp: 60 tablet, Rfl: 5 .  fluticasone (FLONASE) 50 MCG/ACT nasal spray, Place 2 sprays into both nostrils daily as needed for allergies or rhinitis. , Disp: , Rfl:  .  hydrocortisone 2.5 % cream, Apply 1 application topically 2 (two) times daily as needed. dermatitis, Disp: , Rfl:  .  isosorbide mononitrate (IMDUR) 60 MG 24 hr tablet, TAKE 1 TABLET BY MOUTH EVERY DAY, Disp: 90 tablet, Rfl: 1 .  L-Methylfolate (DEPLIN) 7.5 MG TABS, Take 7.5 mg by mouth daily with breakfast. , Disp: , Rfl:  .  lidocaine (LIDODERM) 5 %, Place 2 patches onto the skin daily as needed (for pain). Remove & Discard patch within 12 hours or as directed by MD, Disp: , Rfl:  .  LYRICA 75 MG capsule, Take 75 mg by mouth 3 (three) times daily. , Disp: , Rfl:  .  methocarbamol (ROBAXIN) 500 MG tablet, Take 1 tablet (500 mg total) by mouth every 6 (six) hours as needed for muscle spasms., Disp: 60 tablet, Rfl: 0 .  metoprolol (LOPRESSOR) 50 MG tablet, TAKE 1 TABLET TWICE A DAY, Disp: 180 tablet, Rfl: 1 .  Misc Natural Products (GLUCOS-CHONDROIT-MSM COMPLEX PO), Take 2 tablets by mouth daily., Disp: , Rfl:  .  mometasone-formoterol (DULERA) 200-5 MCG/ACT AERO, Inhale 2 puffs into the lungs 2 (two) times daily., Disp: 1 Inhaler,  Rfl: 4 .  montelukast (SINGULAIR) 10 MG tablet, TAKE  1 TABLET (10 MG TOTAL) BY MOUTH AT BEDTIME., Disp: 90 tablet, Rfl: 3 .  Multiple Vitamin (MULITIVITAMIN WITH MINERALS) TABS, Take 1 tablet by mouth daily.  , Disp: , Rfl:  .  nitroGLYCERIN (NITROSTAT) 0.3 MG SL tablet, Place 0.3 mg under the tongue every 5 (five) minutes as needed for chest pain (as needed)., Disp: , Rfl:  .  NONFORMULARY OR COMPOUNDED ITEM, Estradiol .02% 1 ML Prefilled Applicator Sig: apply vaginally twice a week #90 Day Supply with 4 refills, Disp: 1 each, Rfl: 4 .  ondansetron (ZOFRAN) 4 MG tablet, Take 4 mg by mouth every 4 (four) hours as needed for nausea or vomiting., Disp: , Rfl:  .  OXYCODONE HCL PO, Take 30 mg by mouth daily., Disp: , Rfl:  .  OXYGEN, Inhale 2 L into the lungs at bedtime., Disp: , Rfl:  .  polyethylene glycol (MIRALAX / GLYCOLAX) packet, Take 17 g by mouth 2 (two) times daily. (Patient taking differently: Take 17 g by mouth daily as needed for mild constipation. ), Disp: 14 each, Rfl: 0 .  potassium chloride SA (KLOR-CON M20) 20 MEQ tablet, TAKE 1 TABLET (20 MEQ TOTAL) BY MOUTH DAILY. (Patient taking differently: Take 20 mEq by mouth daily. ), Disp: 30 tablet, Rfl: 3 .  predniSONE (DELTASONE) 10 MG tablet, 4 tabs for 2 days, then 3 tabs for 2 days, 2 tabs for 2 days, then 1 tab for 2 days, then stop, Disp: 20 tablet, Rfl: 0 .  progesterone (PROMETRIUM) 200 MG capsule, TAKE 1 CAPSULE BY MOUTH EVERY DAY FOR FIRST 12 DAYS OF MONTH, Disp: 36 capsule, Rfl: 2 .  promethazine (PHENERGAN) 25 MG tablet, TAKE 1 TABLET BY MOUTH AT BEDTIME AS NEEDED FOR NAUSEA AND VOMITING, Disp: 30 tablet, Rfl: 1 .  sucralfate (CARAFATE) 1 GM/10ML suspension, Take 10 mLs (1 g total) by mouth 4 (four) times daily -  with meals and at bedtime. Reported on 01/05/2016, Disp: 420 mL, Rfl: 6 .  doxycycline (VIBRAMYCIN) 100 MG capsule, Take 1 capsule (100 mg total) by mouth 2 (two) times daily., Disp: 20 capsule, Rfl: 0  EXAM:  Vitals:   11/08/16 1624  BP: 122/88  Pulse: 87  Temp: 98.4  F (36.9 C)    Body mass index is 35.19 kg/m.  GENERAL: vitals reviewed and listed above, alert, oriented, appears well hydrated and in no acute distress  HEENT: atraumatic, conjunttiva clear, no obvious abnormalities on inspection of external nose and ears, normal appearance of ear canals and TMs, thick yellow nasal congestion, mild post oropharyngeal erythema with PND, no tonsillar edema or exudate, no sinus TTP  NECK: no obvious masses on inspection  LUNGS: clear to auscultation bilaterally, no wheezes, rales or rhonchi, good air movement  CV: HRRR, no peripheral edema  MS: moves all extremities without noticeable abnormality  PSYCH: pleasant and cooperative, no obvious depression or anxiety  ASSESSMENT AND PLAN:  Discussed the following assessment and plan:  Sinus congestion Resp infection -? Partially treated sinusitis, if not improving on prednisone will try another course of abx - rx provided, ENT eval if worsening or persists  Medicare annual wellness visit, subsequent -see separate note from Wynetta Fines, RN  Hyperlipidemia, unspecified hyperlipidemia type - Plan: Lipid Panel Essential hypertension - Plan: Basic metabolic panel -labs- she wants to return for fasting labs  -Patient advised to return or notify a doctor immediately if symptoms worsen or persist or new  concerns arise.  Patient Instructions  BEFORE YOU LEAVE: -Medicare exam with susan -lab appointment in 1 month for fasting labs -follow up: in 3 months  Continue the steroid from your pulmonologist.  Please take the antibiotic if your symptoms are not improving on the steroids.  Please see the [facility] Nose and throat specialist if sinus symptoms persist.    Colin Benton R., DO

## 2016-11-08 NOTE — Patient Instructions (Addendum)
BEFORE YOU LEAVE: -Medicare exam with susan -lab appointment in 1 month for fasting labs -follow up: in 3 months  Continue the steroid from your pulmonologist.  Please take the antibiotic if your symptoms are not improving on the steroids.  Please see the Eat, Nose and throat specialist if sinus symptoms persist.     Fall Prevention in the Home Introduction Falls can cause injuries. They can happen to people of all ages. There are many things you can do to make your home safe and to help prevent falls. What can I do on the outside of my home?  Regularly fix the edges of walkways and driveways and fix any cracks.  Remove anything that might make you trip as you walk through a door, such as a raised step or threshold.  Trim any bushes or trees on the path to your home.  Use bright outdoor lighting.  Clear any walking paths of anything that might make someone trip, such as rocks or tools.  Regularly check to see if handrails are loose or broken. Make sure that both sides of any steps have handrails.  Any raised decks and porches should have guardrails on the edges.  Have any leaves, snow, or ice cleared regularly.  Use sand or salt on walking paths during winter.  Clean up any spills in your garage right away. This includes oil or grease spills. What can I do in the bathroom?  Use night lights.  Install grab bars by the toilet and in the tub and shower. Do not use towel bars as grab bars.  Use non-skid mats or decals in the tub or shower.  If you need to sit down in the shower, use a plastic, non-slip stool.  Keep the floor dry. Clean up any water that spills on the floor as soon as it happens.  Remove soap buildup in the tub or shower regularly.  Attach bath mats securely with double-sided non-slip rug tape.  Do not have throw rugs and other things on the floor that can make you trip. What can I do in the bedroom?  Use night lights.  Make sure that you have a  light by your bed that is easy to reach.  Do not use any sheets or blankets that are too big for your bed. They should not hang down onto the floor.  Have a firm chair that has side arms. You can use this for support while you get dressed.  Do not have throw rugs and other things on the floor that can make you trip. What can I do in the kitchen?  Clean up any spills right away.  Avoid walking on wet floors.  Keep items that you use a lot in easy-to-reach places.  If you need to reach something above you, use a strong step stool that has a grab bar.  Keep electrical cords out of the way.  Do not use floor polish or wax that makes floors slippery. If you must use wax, use non-skid floor wax.  Do not have throw rugs and other things on the floor that can make you trip. What can I do with my stairs?  Do not leave any items on the stairs.  Make sure that there are handrails on both sides of the stairs and use them. Fix handrails that are broken or loose. Make sure that handrails are as long as the stairways.  Check any carpeting to make sure that it is firmly attached to the stairs.  Fix any carpet that is loose or worn.  Avoid having throw rugs at the top or bottom of the stairs. If you do have throw rugs, attach them to the floor with carpet tape.  Make sure that you have a light switch at the top of the stairs and the bottom of the stairs. If you do not have them, ask someone to add them for you. What else can I do to help prevent falls?  Wear shoes that:  Do not have high heels.  Have rubber bottoms.  Are comfortable and fit you well.  Are closed at the toe. Do not wear sandals.  If you use a stepladder:  Make sure that it is fully opened. Do not climb a closed stepladder.  Make sure that both sides of the stepladder are locked into place.  Ask someone to hold it for you, if possible.  Clearly mark and make sure that you can see:  Any grab bars or  handrails.  First and last steps.  Where the edge of each step is.  Use tools that help you move around (mobility aids) if they are needed. These include:  Canes.  Walkers.  Scooters.  Crutches.  Turn on the lights when you go into a dark area. Replace any light bulbs as soon as they burn out.  Set up your furniture so you have a clear path. Avoid moving your furniture around.  If any of your floors are uneven, fix them.  If there are any pets around you, be aware of where they are.  Review your medicines with your doctor. Some medicines can make you feel dizzy. This can increase your chance of falling. Ask your doctor what other things that you can do to help prevent falls. This information is not intended to replace advice given to you by your health care provider. Make sure you discuss any questions you have with your health care provider. Document Released: 09/15/2009 Document Revised: 04/26/2016 Document Reviewed: 12/24/2014  2017 Elsevier  Health Maintenance, Female Introduction Adopting a healthy lifestyle and getting preventive care can go a long way to promote health and wellness. Talk with your health care provider about what schedule of regular examinations is right for you. This is a good chance for you to check in with your provider about disease prevention and staying healthy. In between checkups, there are plenty of things you can do on your own. Experts have done a lot of research about which lifestyle changes and preventive measures are most likely to keep you healthy. Ask your health care provider for more information. Weight and diet Eat a healthy diet  Be sure to include plenty of vegetables, fruits, low-fat dairy products, and lean protein.  Do not eat a lot of foods high in solid fats, added sugars, or salt.  Get regular exercise. This is one of the most important things you can do for your health.  Most adults should exercise for at least 150 minutes  each week. The exercise should increase your heart rate and make you sweat (moderate-intensity exercise).  Most adults should also do strengthening exercises at least twice a week. This is in addition to the moderate-intensity exercise. Maintain a healthy weight  Body mass index (BMI) is a measurement that can be used to identify possible weight problems. It estimates body fat based on height and weight. Your health care provider can help determine your BMI and help you achieve or maintain a healthy weight.  For females 69  years of age and older:  A BMI below 18.5 is considered underweight.  A BMI of 18.5 to 24.9 is normal.  A BMI of 25 to 29.9 is considered overweight.  A BMI of 30 and above is considered obese. Watch levels of cholesterol and blood lipids  You should start having your blood tested for lipids and cholesterol at 58 years of age, then have this test every 5 years.  You may need to have your cholesterol levels checked more often if:  Your lipid or cholesterol levels are high.  You are older than 58 years of age.  You are at high risk for heart disease. Cancer screening Lung Cancer  Lung cancer screening is recommended for adults 68-39 years old who are at high risk for lung cancer because of a history of smoking.  A yearly low-dose CT scan of the lungs is recommended for people who:  Currently smoke.  Have quit within the past 15 years.  Have at least a 30-pack-year history of smoking. A pack year is smoking an average of one pack of cigarettes a day for 1 year.  Yearly screening should continue until it has been 15 years since you quit.  Yearly screening should stop if you develop a health problem that would prevent you from having lung cancer treatment. Breast Cancer  Practice breast self-awareness. This means understanding how your breasts normally appear and feel.  It also means doing regular breast self-exams. Let your health care provider know about  any changes, no matter how small.  If you are in your 20s or 30s, you should have a clinical breast exam (CBE) by a health care provider every 1-3 years as part of a regular health exam.  If you are 26 or older, have a CBE every year. Also consider having a breast X-ray (mammogram) every year.  If you have a family history of breast cancer, talk to your health care provider about genetic screening.  If you are at high risk for breast cancer, talk to your health care provider about having an MRI and a mammogram every year.  Breast cancer gene (BRCA) assessment is recommended for women who have family members with BRCA-related cancers. BRCA-related cancers include:  Breast.  Ovarian.  Tubal.  Peritoneal cancers.  Results of the assessment will determine the need for genetic counseling and BRCA1 and BRCA2 testing. Cervical Cancer  Your health care provider may recommend that you be screened regularly for cancer of the pelvic organs (ovaries, uterus, and vagina). This screening involves a pelvic examination, including checking for microscopic changes to the surface of your cervix (Pap test). You may be encouraged to have this screening done every 3 years, beginning at age 75.  For women ages 55-65, health care providers may recommend pelvic exams and Pap testing every 3 years, or they may recommend the Pap and pelvic exam, combined with testing for human papilloma virus (HPV), every 5 years. Some types of HPV increase your risk of cervical cancer. Testing for HPV may also be done on women of any age with unclear Pap test results.  Other health care providers may not recommend any screening for nonpregnant women who are considered low risk for pelvic cancer and who do not have symptoms. Ask your health care provider if a screening pelvic exam is right for you.  If you have had past treatment for cervical cancer or a condition that could lead to cancer, you need Pap tests and screening for  cancer for  at least 20 years after your treatment. If Pap tests have been discontinued, your risk factors (such as having a new sexual partner) need to be reassessed to determine if screening should resume. Some women have medical problems that increase the chance of getting cervical cancer. In these cases, your health care provider may recommend more frequent screening and Pap tests. Colorectal Cancer  This type of cancer can be detected and often prevented.  Routine colorectal cancer screening usually begins at 58 years of age and continues through 58 years of age.  Your health care provider may recommend screening at an earlier age if you have risk factors for colon cancer.  Your health care provider may also recommend using home test kits to check for hidden blood in the stool.  A small camera at the end of a tube can be used to examine your colon directly (sigmoidoscopy or colonoscopy). This is done to check for the earliest forms of colorectal cancer.  Routine screening usually begins at age 23.  Direct examination of the colon should be repeated every 5-10 years through 58 years of age. However, you may need to be screened more often if early forms of precancerous polyps or small growths are found. Skin Cancer  Check your skin from head to toe regularly.  Tell your health care provider about any new moles or changes in moles, especially if there is a change in a mole's shape or color.  Also tell your health care provider if you have a mole that is larger than the size of a pencil eraser.  Always use sunscreen. Apply sunscreen liberally and repeatedly throughout the day.  Protect yourself by wearing long sleeves, pants, a wide-brimmed hat, and sunglasses whenever you are outside. Heart disease, diabetes, and high blood pressure  High blood pressure causes heart disease and increases the risk of stroke. High blood pressure is more likely to develop in:  People who have blood  pressure in the high end of the normal range (130-139/85-89 mm Hg).  People who are overweight or obese.  People who are African American.  If you are 82-94 years of age, have your blood pressure checked every 3-5 years. If you are 28 years of age or older, have your blood pressure checked every year. You should have your blood pressure measured twice-once when you are at a hospital or clinic, and once when you are not at a hospital or clinic. Record the average of the two measurements. To check your blood pressure when you are not at a hospital or clinic, you can use:  An automated blood pressure machine at a pharmacy.  A home blood pressure monitor.  If you are between 43 years and 70 years old, ask your health care provider if you should take aspirin to prevent strokes.  Have regular diabetes screenings. This involves taking a blood sample to check your fasting blood sugar level.  If you are at a normal weight and have a low risk for diabetes, have this test once every three years after 58 years of age.  If you are overweight and have a high risk for diabetes, consider being tested at a younger age or more often. Preventing infection Hepatitis B  If you have a higher risk for hepatitis B, you should be screened for this virus. You are considered at high risk for hepatitis B if:  You were born in a country where hepatitis B is common. Ask your health care provider which countries are considered  high risk.  Your parents were born in a high-risk country, and you have not been immunized against hepatitis B (hepatitis B vaccine).  You have HIV or AIDS.  You use needles to inject street drugs.  You live with someone who has hepatitis B.  You have had sex with someone who has hepatitis B.  You get hemodialysis treatment.  You take certain medicines for conditions, including cancer, organ transplantation, and autoimmune conditions. Hepatitis C  Blood testing is recommended  for:  Everyone born from [birth year] through 1963.  Anyone with known risk factors for hepatitis C. Sexually transmitted infections (STIs)  You should be screened for sexually transmitted infections (STIs) including gonorrhea and chlamydia if:  You are sexually active and are younger than 58 years of age.  You are older than 58 years of age and your health care provider tells you that you are at risk for this type of infection.  Your sexual activity has changed since you were last screened and you are at an increased risk for chlamydia or gonorrhea. Ask your health care provider if you are at risk.  If you do not have HIV, but are at risk, it may be recommended that you take a prescription medicine daily to prevent HIV infection. This is called pre-exposure prophylaxis (PrEP). You are considered at risk if:  You are sexually active and do not regularly use condoms or know the HIV status of your partner(s).  You take drugs by injection.  You are sexually active with a partner who has HIV. Talk with your health care provider about whether you are at high risk of being infected with HIV. If you choose to begin PrEP, you should first be tested for HIV. You should then be tested every 3 months for as long as you are taking PrEP. Pregnancy  If you are premenopausal and you may become pregnant, ask your health care provider about preconception counseling.  If you may become pregnant, take 400 to 800 micrograms (mcg) of folic acid every day.  If you want to prevent pregnancy, talk to your health care provider about birth control (contraception). Osteoporosis and menopause  Osteoporosis is a disease in which the bones lose minerals and strength with aging. This can result in serious bone fractures. Your risk for osteoporosis can be identified using a bone density scan.  If you are 84 years of age or older, or if you are at risk for osteoporosis and fractures, ask your health care provider if you  should be screened.  Ask your health care provider whether you should take a calcium or vitamin D supplement to lower your risk for osteoporosis.  Menopause may have certain physical symptoms and risks.  Hormone replacement therapy may reduce some of these symptoms and risks. Talk to your health care provider about whether hormone replacement therapy is right for you. Follow these instructions at home:  Schedule regular health, dental, and eye exams.  Stay current with your immunizations.  Do not use any tobacco products including cigarettes, chewing tobacco, or electronic cigarettes.  If you are pregnant, do not drink alcohol.  If you are breastfeeding, limit how much and how often you drink alcohol.  Limit alcohol intake to no more than 1 drink per day for nonpregnant women. One drink equals 12 ounces of beer, 5 ounces of wine, or 1 ounces of hard liquor.  Do not use street drugs.  Do not share needles.  Ask your health care provider for help if you  information about quitting drugs.  Tell your health care provider if you often feel depressed.  Tell your health care provider if you have ever been abused or do not feel safe at home. This information is not intended to replace advice given to you by your health care provider. Make sure you discuss any questions you have with your health care provider. Document Released: 06/04/2011 Document Revised: 04/26/2016 Document Reviewed: 08/23/2015  2017 Elsevier  

## 2016-11-12 DIAGNOSIS — Z79891 Long term (current) use of opiate analgesic: Secondary | ICD-10-CM | POA: Diagnosis not present

## 2016-11-12 DIAGNOSIS — M47816 Spondylosis without myelopathy or radiculopathy, lumbar region: Secondary | ICD-10-CM | POA: Diagnosis not present

## 2016-11-12 DIAGNOSIS — M179 Osteoarthritis of knee, unspecified: Secondary | ICD-10-CM | POA: Diagnosis not present

## 2016-11-12 DIAGNOSIS — G894 Chronic pain syndrome: Secondary | ICD-10-CM | POA: Diagnosis not present

## 2016-11-13 ENCOUNTER — Other Ambulatory Visit: Payer: Self-pay

## 2016-11-13 ENCOUNTER — Ambulatory Visit (INDEPENDENT_AMBULATORY_CARE_PROVIDER_SITE_OTHER)
Admission: RE | Admit: 2016-11-13 | Discharge: 2016-11-13 | Disposition: A | Discharge status: Home / Self Care | Payer: Medicare Other | Source: Ambulatory Visit | Attending: Internal Medicine | Admitting: Internal Medicine

## 2016-11-13 ENCOUNTER — Ambulatory Visit (INDEPENDENT_AMBULATORY_CARE_PROVIDER_SITE_OTHER): Payer: Medicare Other | Admitting: Internal Medicine

## 2016-11-13 ENCOUNTER — Telehealth: Payer: Self-pay | Admitting: Pulmonary Disease

## 2016-11-13 ENCOUNTER — Encounter: Payer: Self-pay | Admitting: Internal Medicine

## 2016-11-13 VITALS — BP 110/80 | HR 65 | Temp 98.5°F | Ht 66.0 in | Wt 218.0 lb

## 2016-11-13 DIAGNOSIS — G894 Chronic pain syndrome: Secondary | ICD-10-CM | POA: Diagnosis not present

## 2016-11-13 DIAGNOSIS — R05 Cough: Secondary | ICD-10-CM | POA: Diagnosis not present

## 2016-11-13 DIAGNOSIS — J45991 Cough variant asthma: Secondary | ICD-10-CM

## 2016-11-13 DIAGNOSIS — M47816 Spondylosis without myelopathy or radiculopathy, lumbar region: Secondary | ICD-10-CM | POA: Diagnosis not present

## 2016-11-13 DIAGNOSIS — M179 Osteoarthritis of knee, unspecified: Secondary | ICD-10-CM | POA: Diagnosis not present

## 2016-11-13 DIAGNOSIS — R0602 Shortness of breath: Secondary | ICD-10-CM | POA: Diagnosis not present

## 2016-11-13 DIAGNOSIS — Z79891 Long term (current) use of opiate analgesic: Secondary | ICD-10-CM | POA: Diagnosis not present

## 2016-11-13 LAB — NITRIC OXIDE: Nitric Oxide: 8

## 2016-11-13 MED ORDER — BUDESONIDE-FORMOTEROL FUMARATE 80-4.5 MCG/ACT IN AERO: INHALATION_SPRAY | RESPIRATORY_TRACT | 11 refills | Status: DC

## 2016-11-13 MED ORDER — BUDESONIDE-FORMOTEROL FUMARATE 80-4.5 MCG/ACT IN AERO: 2.0000 | INHALATION_SPRAY | Freq: Two times a day (BID) | RESPIRATORY_TRACT | 0 refills | Status: DC

## 2016-11-13 MED ORDER — PREDNISONE 10 MG PO TABS: ORAL_TABLET | ORAL | 0 refills | Status: DC

## 2016-11-13 NOTE — Progress Notes (Signed)
Subjective:    Patient ID: Shelby Anderson, female    DOB: 1956-10-07, 60 y.o.   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  She was evaluated for CAD  at [facility] for syncope.& has a loop recorder - brady & int a - fibn   Significant tests/ events  Spirometry 12/2011 - no airway obstruction -No reversibility with bronchodilator , mod restriction Her husband smokes.. 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  EGD in 03/2011 (Shelby Anderson) has shown peptic ulcer disease.  12/2013  barium esophagram - distal esophageal stricture.   stricture dilated (Shelby Anderson) >> Restarted Singulair 04/2015   Oct 2017 "perfectly fine"  Maintained on singulair/ dulera 200 2bid and maybe once a week saba   Sep 25 2016   Shelby Anderson   10/19/2016 NP Acute OV  : Asthma never smoker  Pt presents for an acute office visit.  Complains of 2 weeks of cough, congestion , initial low grade fever, R ant cp  wheezing and increased DOE. Was seen in ER on 11/3 CTA Chest was neg for PE or acute process. rx as viral pleuritis Says cough is worse this week with thick green brown mucus .  Remains on [place] , Singular .  Remains on O2 2l/m At bedtime   rec Levaquin 500mg  daily for 7 days , take with food.  Mucinex DM Twice daily  As needed  Cough/congestion .  Prednisone taper to have on hold if wheezing does not resolve or worsens > started at end abx because still needing neb Use albuterol neb every 6hr as needed    11/13/2016 acute extended ov/Shelby Anderson re: ? Asthma flare  Never smoker  Chief Complaint  Patient  presents with  . Acute Visit    Pt c/o increased SOB, CP and wheezing for the past 3 wks. She states that her CP is "stabbing pain" and on the right side.   gradually worse since finished last round of prednisone as above with severe hacking cough/ nasal congestion worse at hs and severe HB symptoms on ppi bid plus progresterone rx per gyn Cp is the same one she gets "everytime she starts coughing" and already eval with neg CTa 10/05/16  Cough and HB worse supine / no better with doxy so far, no better with dulera 200 though hfa not optimal   No obvious day to day or daytime variability or assoc excess/ purulent sputum or mucus plugs or hemoptysis or  chest tightness,   or overt sinus  symptoms. No unusual exp hx or h/o childhood pna/ asthma or knowledge of premature birth.    Also denies any obvious fluctuation of symptoms with weather or environmental changes or other aggravating or alleviating factors except as outlined above   Current Medications, Allergies, Complete Past Medical History, Past Surgical History, Family History, and Social History were reviewed in [employer] record.  ROS  The following are not active complaints unless bolded sore throat, dysphagia, dental problems, itching, sneezing,  nasal congestion or excess/ purulent secretions, ear ache,  fever, chills, sweats, unintended wt loss, classically  exertional cp,  orthopnea pnd or leg swelling, presyncope, palpitations, abdominal pain, anorexia, nausea, vomiting, diarrhea  or change in bowel or bladder habits, change in stools or urine, dysuria,hematuria,  rash, arthralgias, visual complaints, headache, numbness, weakness or ataxia or problems with walking or coordination,  change in mood/affect or memory.                 Objective:   Physical Exam   Wt Readings from Last 3 Encounters:  11/13/16 218 lb (98.9 kg)  11/08/16 218 lb (98.9 kg)  10/19/16 219 lb 9.6 oz (99.6 kg)    Vital signs  reviewed - Note on arrival 02 sats  97% on RA        Gen. Pleasant, obese, uncomfortable but not acutely ill/  walking with cane  ENT - no lesions, no post nasal drip Neck: No JVD, no thyromegaly, no carotid bruits Lungs: no use of accessory muscles, no dullness to percussion, mostly transmitted pseudowheezing on auscultation Speaking in full sentences.  Cardiovascular: Rhythm regular, heart sounds  normal, no murmurs or gallops, tr  peripheral edema Musculoskeletal: No deformities, no cyanosis or clubbing , no tremors Neuro: alert/ nl senorium/ no motor def       CTA Chest 10/05/16  No evidence of pulmonary emboli., nad     CXR PA and Lateral:   11/13/2016 :    I personally reviewed images and agree with radiology impression as follows:    Stable cardiac silhouette.  No edema or consolidation.

## 2016-11-13 NOTE — Patient Instructions (Addendum)
Finish the doxycycline and if not better after you finish it call for sinus CT - [place] [phone]   Plan A = Automatic = symbicort 80 Take 2 puffs first thing in am and then another 2 puffs about 12 hours later.    Plan B = Backup Only use your albuterol as a rescue medication to be used if you can't catch your breath by resting or doing a relaxed purse lip breathing pattern.  - The less you use it, the better it will work when you need it. - Ok to use the inhaler up to 2 puffs  every 4 hours if you must but call for appointment if use goes up over your usual need - Don't leave home without it !!  (think of it like the spare tire for your car)   Plan C = Crisis - only use your albuterol nebulizer if you first try Plan B and it fails to help > ok to use the nebulizer up to every 4 hours but if start needing it regularly call for immediate appointment   Nexium dose is 40 mg Take 30- 60 min before your first and last meals of the day   GERD (REFLUX)  is an extremely common cause of respiratory symptoms just like yours , many times with no obvious heartburn at all.    It can be treated with medication, but also with lifestyle changes including elevation of the head of your bed (ideally with 6 inch  bed blocks),  Smoking cessation, avoidance of late meals, excessive alcohol, and avoid fatty foods, chocolate, peppermint, colas, red wine, and acidic juices such as orange juice.  NO MINT OR MENTHOL PRODUCTS SO NO COUGH DROPS   USE SUGARLESS CANDY INSTEAD (Jolley ranchers or Stover's or Life Savers) or even ice chips will also do - the key is to swallow to prevent all throat clearing. NO OIL BASED VITAMINS - use powdered substitutes.  Take delsym two tsp every 12 hours and supplement if needed with  Oxycodone   up to 2 every 4 hours to suppress the urge to cough. Swallowing water or using ice chips/non mint and menthol containing candies (such as lifesavers or sugarless jolly ranchers) are also  effective.  You should rest your voice and avoid activities that you know make you cough.  Once you have eliminated the cough for 3 straight days try reducing the oxycodone first,  then the delsym as tolerated.  If not improving by end of week > Prednisone 10 mg take  4 each am x 2 days,   2 each am x 2 days,  1 each am x 2 days and stop   Please remember to go to the x-ray department downstairs for your tests - we will call you with the results when they are available.

## 2016-11-13 NOTE — Progress Notes (Signed)
Spoke with pt and notified of results per Dr. Wert. Pt verbalized understanding and denied any questions. 

## 2016-11-13 NOTE — Patient Outreach (Signed)
[place] [facility]) Care Management  11/13/2016  Shelby Anderson 12-22-1955 [id number]   REFERRAL DATE: 11/09/16 REFERRAL SOURCE: Primary MD REFERRAL REASON: Can't afford medications, history of heart disease, respiratory:, needs social work consult  Telephone call to patient regarding primary MD referral. Unable to reach patient. HIPAA compliant voice message left with call back phone number.  PLAN; RNCM will attempt 2nd telephone call to patient within 1 week.  Quinn Plowman RN,BSN,CCM [facility] Telephonic  [phone]

## 2016-11-13 NOTE — Telephone Encounter (Signed)
Spoke with pt, states that after completing abx and pred taper given by TP on 11/17, pt's symptoms have returned- c/o sharp chest pains, SOB, wheezing, chest tightness.  Pt scheduled this afternoon with MW for an acute visit.  Nothing further needed.

## 2016-11-14 ENCOUNTER — Encounter: Payer: Self-pay | Admitting: Internal Medicine

## 2016-11-14 ENCOUNTER — Other Ambulatory Visit: Payer: Medicare Other

## 2016-11-14 ENCOUNTER — Ambulatory Visit: Payer: Medicare Other | Admitting: Gynecology

## 2016-11-14 NOTE — Assessment & Plan Note (Signed)
Body mass index is 35.19  Lab Results  Component Value Date   TSH 1.88 04/10/2016     Contributing to gerd tendency/ doe/reviewed the need and the process to achieve and maintain neg calorie balance > defer f/u primary care including intermittently monitoring thyroid status

## 2016-11-14 NOTE — Assessment & Plan Note (Signed)
FENO 11/13/2016  =   8 on dulera 200 2bid > rec sym 80 2bid  - Spirometry 11/13/2016  Restrictive changes during "asthma attack" on dulera 200 and saba w/in 4 h of study (no curvature)  - 11/13/2016  After extensive coaching HFA effectiveness =    75%    DDX of  difficult airways management almost all start with A and  include Adherence, Ace Inhibitors, Acid Reflux, Active Sinus Disease, Alpha 1 Antitripsin deficiency, Anxiety masquerading as Airways dz,  ABPA,  Allergy(esp in young), Aspiration (esp in elderly), Adverse effects of meds,  Active smokers, A bunch of PE's (a small clot burden can't cause this syndrome unless there is already severe underlying pulm or vascular dz with poor reserve) plus two Bs  = Bronchiectasis and Beta blocker use..and one C= CHF    Adherence is always the initial "prime suspect" and is a multilayered concern that requires a "trust but verify" approach in every patient - starting with knowing how to use medications, especially inhalers, correctly, keeping up with refills and understanding the fundamental difference between maintenance and prns vs those medications only taken for a very short course and then stopped and not refilled.  - The proper method of use, as well as anticipated side effects, of a metered-dose inhaler are discussed and demonstrated to the patient. Improved effectiveness after extensive coaching during this visit to a level of approximately 75 % from a baseline of 50 %    ? Acid (or non-acid) GERD > always difficult to exclude as up to 75% of pts in some series report no assoc GI/ Heartburn symptoms> rec max (24h)  acid suppression and diet restrictions/ reviewed and instructions given in writing.  Consider trial off progesterone   NB Of the three most common causes of chronic cough, only one (GERD)  can actually cause the other two (asthma and post nasal drip syndrome)  and perpetuate the cylce of cough inducing airway trauma, inflammation, heightened  sensitivity to reflux which is prompted by the cough itself via a cyclical mechanism.    This may partially respond to steroids and look like asthma and post nasal drainage but never erradicated completely unless the cough and the secondary reflux are eliminated, preferably both at the same time.  While not intuitively obvious, many patients with chronic low grade reflux do not cough until there is a secondary insult that disturbs the protective epithelial barrier and exposes sensitive nerve endings.  This can be viral or direct physical injury such as with an endotracheal tube.   The point is that once this occurs, it is difficult to eliminate using anything but a maximally effective acid suppression regimen at least in the short run, accompanied by an appropriate diet to address non acid GERD.   ? Allergy > ok to take an additional round of pred x 6 days if not improving on recs/ continue singulair and consider allergy w/u if not already done recently elsewhere   ? Active sinus Dz >  Consider sinus ct next  ? Anxiety > usually at the bottom of this list of usual suspects   ? Bronchiectasis > not supported by CTa though would need HRCT to be sure   I had an extended discussion with the patient reviewing all relevant studies completed to date and  lasting 25 minutes of a 40  minute acute  Visit inpt prev unknown to me   re  non-specific but potentially very serious pulmonary symptoms of unknown etiology.  Each maintenance medication was reviewed in detail including most importantly the difference between maintenance and prns and under what circumstances the prns are to be triggered using an action plan format that is not reflected in the computer generated alphabetically organized AVS.    Please see AVS for unique instructions that I personally wrote and verbalized to the the pt in detail and then reviewed with pt  by my nurse highlighting any  changes in therapy recommended at today's visit to  their plan of care.

## 2016-11-15 ENCOUNTER — Other Ambulatory Visit: Payer: Self-pay

## 2016-11-15 NOTE — Patient Outreach (Signed)
[place] [facility]) Care Management  11/15/2016  TRANISE Anderson Feb 21, 1956 [id number]   REFERRAL DATE: 11/09/16 REFERRAL SOURCE:  Primary MD REFERRAL REASON:  Difficulty affording medications, history of heart disease, respiratory:  Needs social work consult.  CONSENT: Self.  Patient gave verbal consent for [facility] care management program Agreed to social work and pharmacy consult. Patient refused [facility] services at this time.  ADVANCE DIRECTIVE:  No healthcare power of attorney / living will. FULL CODE  PROVIDERS: Dr. Colin Benton - primary md Dr. Elsworth Soho - pulmonary   SUBJECTIVE:  Telephone call to patient regarding primary MD referral. HIPAA verified with patient.  Patient states she is having trouble affording her medications. Patient states she usually ends up in the donut hole early in the year. Patient states when se is in the donut hole her medications can range from $600 to $1000 per month.  hPatient states she is currently having  Trouble affording her inhalers, nexium, and Deplin. Patient states she has to stretch her inhalers so that they will last.  Patient states she has recently seen her primary MD and her pulmonologist. Patient states she has been placed on doxocycline due to sinus issues and fluid in both ears. Patient states she has been having difficulty breathing. States her doctors are aware. Patient states she has a history of asthma. .  Patient states she has been experiencing chest pain and coughing. Patient states she saw Dr. Melvyn Novas recently.  States her right sided chest pain is coming from the cough and irritation. Patient states she was also given a prednisone taper to take if the doxocycline did not work. . Patient states she is to follow up with her doctor after completion of the doxocycline. States if this medication does not work the doctor wants her to have a CT of the head and be referred to an ENT doctor.  Patient states she uses oxygen and has a  nebulizer for home use.   Patient states she is having difficulty  Affording food. States she is taking care of her 2 grandchildren ages 9 and 25.  States both of the parents to the grandchildren are on drugs. Patient states she and her husband are separated but states he still stays with her from time to time to help her with the grandchildren and when she has to go to her doctor appointments.  Patient states she has transportation assistance.  Patient reports last emergency room visit was 10/05/16.  Patient states her last inpatient hospitalization was 09/25/26 for a left total knee replacement.  Patient states she uses a walker for ambulation.   Patient states she has depression. States she is on medication but does not see a counselor or psychiatrist.   ASSESSMENT: Patients PHQ2 - [place] - 20 Patient reports she has had 2 heart attacks and has chronic chest pain.   PLAN: RNCM will refer patient to social worker and pharmacist.   Quinn Plowman RN,BSN,CCM [facility] Telephonic  [phone]

## 2016-11-21 ENCOUNTER — Other Ambulatory Visit: Payer: Self-pay | Admitting: *Deleted

## 2016-11-21 NOTE — Patient Outreach (Signed)
[place] [facility]) Care Management  11/21/2016  Shelby Anderson 1956-06-29 [id number]   Phone call to patient to assist with community resource needs for food.  Patient's phone just rang, there was no voicemail.   Plan This social worker will try to contact patient again within 1 week.   [name] [facility] Care Management [phone]

## 2016-11-22 DIAGNOSIS — J45909 Unspecified asthma, uncomplicated: Secondary | ICD-10-CM | POA: Diagnosis not present

## 2016-11-22 DIAGNOSIS — M1712 Unilateral primary osteoarthritis, left knee: Secondary | ICD-10-CM | POA: Diagnosis not present

## 2016-11-23 ENCOUNTER — Other Ambulatory Visit: Payer: Self-pay | Admitting: *Deleted

## 2016-11-23 NOTE — Patient Outreach (Addendum)
[place] [facility]) Care Management  11/23/2016  Shelby Anderson 22-Oct-1956 [id number]   Phone call to patient to provide community resource information for food.  Patient's line was busy X 2. This patient will try to contact patient again within 7 business days.   [name] [facility] Care Management [phone]

## 2016-12-04 ENCOUNTER — Encounter: Payer: Self-pay | Admitting: *Deleted

## 2016-12-04 ENCOUNTER — Other Ambulatory Visit: Payer: Self-pay | Admitting: *Deleted

## 2016-12-04 NOTE — Patient Outreach (Signed)
[place] [facility]) Care Management  12/04/2016  DENAJAH RADWANSKI 1956-08-31 [id number]   Phone call to patient to provide community resource information for food resources.  Patient's line was busy x '3.  This Education officer, museum will try to contact patient by letter.   [name] [place] Care Management [phone]

## 2016-12-06 DIAGNOSIS — M1712 Unilateral primary osteoarthritis, left knee: Secondary | ICD-10-CM | POA: Diagnosis not present

## 2016-12-07 ENCOUNTER — Telehealth: Payer: Self-pay | Admitting: Family Medicine

## 2016-12-07 ENCOUNTER — Other Ambulatory Visit: Payer: Self-pay | Admitting: Pharmacist

## 2016-12-07 ENCOUNTER — Telehealth: Payer: Self-pay | Admitting: Pulmonary Disease

## 2016-12-07 NOTE — Telephone Encounter (Signed)
Pt states she is still having issues and was advised by Dr [name] if this continues, she would refer to ENT. Pt states still having congestion, upper resp issues and would like to proceed with referral.

## 2016-12-07 NOTE — Telephone Encounter (Signed)
Please provider her the ENT office numbers to call - no referral needed. Thanks.

## 2016-12-07 NOTE — Telephone Encounter (Signed)
I called the pt and informed her of the message below and she agreed to call the ENT she was referred to by Dr [name] previously for her ear pain.

## 2016-12-07 NOTE — Addendum Note (Signed)
Addended by: Harlow Asa A on: 12/07/2016 01:52 PM   Modules accepted: Orders

## 2016-12-07 NOTE — Telephone Encounter (Signed)
Spoke with pt. And she stated she has an appointment with RA next week, and she stated she spoke with another provider who stated that she may need a MRI and she wanted to know if that can be ordered. I informed the pt. We could not place this order that would have to go through the provider. She stated that she would wait to see RA to decided if she should have this scan done. Nothing further is needed at this time.

## 2016-12-07 NOTE — Patient Outreach (Addendum)
[place] [facility]) Care Management  [place]   12/07/2016  ROME BEVANS 1956-07-15 [id number]  Subjective: KHUSHBOO NEGA is a 61 y.o. female referred to pharmacy for medication review and medication assistance. Called and spoke with patient. HIPAA identifiers verified and verbal consent received.  Ms. Domico reports that she has difficulty with medication affordability. Reports that she typically goes into the coverage gap of her insurance in around April of each calendar year. Reports that she has applied for extra help through social security, but has been denied based on her income level. Reports that she takes care of two grandchildren in her home, but does not have legal custody of the children. Reports that she has the greatest difficulty with the cost of her Deplin, Nexium, and inhalers.  Reports that she has had particular difficulty with affording the Deplin as it is not covered by her insurance. Reports that she has been questioning whether she observes any benefit from this medication. Reports that she will discuss with her provider whether she still needs to take it.  Patient reports that she takes brand name Nexium and Carafate to control her acid. Reports that she takes brand name Nexium as she has GI side effects with the generic forms of this medication. Note that per the formulary for the patient's insurance from the [employer], brand name Nexium, like its generic, is a tier 3 option for the patient. Note that pantoprazole is a tier 1 alternative and that omeprazole is a tier 2 alternative. Patient reports that she has been tried on both pantoprazole and omeprazole in the past, but that neither controlled her acid symptoms.  Patient reports that she currently uses a Programme researcher, broadcasting/film/video inhaler. Note that this is the preferred albuterol metered dose inhaler through the patient's plan, tier 3. Note that the Patient Access Network (PAN)  foundation's program for asthma is currently open. Provide [name] with the phone number for the PAN foundation. Patient reports that Dr. Melvyn Novas recently switched her inhaler from [facility] to Symbicort. However, reports that she may be switched back to [facility] at her upcoming visit. Let patient know that through her insurance, Symbicort is a tier 3 option, while Ruthe Mannan is a tier 4 option. Discuss patient assistance programs through the manufacturers for inhalers and out-of-pocket requirements that exist for some of these programs.   Counsel patient on use of [employer] for cost savings. Provide patient with mail order phone number from [employer] website. Discuss with patient using over the counter benefit as well.   Review with patient each of her medications including name, dose, indication and administration. Update patient's EPIC medication record accordingly. Also review with patient her EPIC allergy list.  Counsel patient about using caution with her eszopiclone, Lyrica, methocarbamol and oxycodone as each can cause dizziness, sedation and increase her fall risk. Particularly caution patient about using these medications in combination. Patient reports that she only uses the methocarbamol if needed on the days that she has physical therapy. Patient verbalizes understanding. Patient reports that she last had a fall about a month ago, following her knee replacement surgery. Denies any dizziness at the time. Reports that her leg just went out from under her. Reports that she caught herself with her walker and just had some minor bruising. Reports that she fell more frequently prior to her knee surgery and sprained her ankle at one point. Reports that she uses her walker or cane at all times.  Patient  checks her Nitrostat and confirms that this medication is in-date.  Patient reports that she does have difficulty at times with swallowing her potassium. Counsel patient about the concern  of ulceration with potassium if unable to fully swallow this medication. Counsel patient on method for creating a liquid suspension from these tablets, as directed per the below product labeling. Patient writes down directions and verbalizes understanding.  Prepare an aqueous (water) suspension as follows:  1. Place the whole tablet(s) in approximately one-half glass of water (4 fluid ounces). 2. Allow approximately 2 minutes for the tablet(s) to disintegrate. 3. Stir for about half a minute after the tablet(s) has disintegrated. 4. Swirl the suspension and consume the entire contents of the glass immediately by drinking or by the use of a straw. 5. Add another one fluid ounce of water, swirl, and consume immediately. Then, add an additional one fluid ounce of water, swirl, and consume immediately.   Patient reports that she has no further medication questions/concerns at this time. Provide patient with my phone number.  Objective:   Encounter Medications: Outpatient Encounter Prescriptions as of 12/07/2016  Medication Sig Note  . acetaminophen (TYLENOL) 500 MG tablet Take 1,000 mg by mouth every 4 (four) hours as needed for fever.   Marland Kitchen albuterol (PROVENTIL HFA;VENTOLIN HFA) 108 (90 Base) MCG/ACT inhaler Inhale 2 puffs into the lungs every 4 (four) hours as needed. Shortness of breath   . albuterol (PROVENTIL) (2.5 MG/3ML) 0.083% nebulizer solution Take 3 mLs (2.5 mg total) by nebulization every 6 (six) hours as needed for wheezing or shortness of breath (J45.40).   Marland Kitchen aspirin 81 MG chewable tablet Chew 1 tablet (81 mg total) by mouth 2 (two) times daily. Take for 4 weeks, then resume regular dose. 12/07/2016: Taking once daily  . atorvastatin (LIPITOR) 40 MG tablet TAKE 1 TABLET BY MOUTH EVERY DAY (Patient taking differently: TAKE 1 TABLET BY MOUTH EVERY night at bedtime)   . budesonide-formoterol (SYMBICORT) 80-4.5 MCG/ACT inhaler Take 2 puffs first thing in am and then another 2 puffs about 12  hours later.   . Calcium Carb-Cholecalciferol (CALCIUM 600+D) 600-800 MG-UNIT TABS Take 1 tablet by mouth daily.   . Cinnamon 500 MG capsule Take 1,000 mg by mouth 2 (two) times daily.    . clobetasol cream (TEMOVATE) [id number] % Apply 1 application topically See admin instructions. Apply to affected area one to two times a week as needed for rash.   . Cyanocobalamin (VITAMIN B-12) 5000 MCG TBDP Take 5,000 mcg by mouth daily.    Marland Kitchen docusate sodium (COLACE) 100 MG capsule Take 1 capsule (100 mg total) by mouth 2 (two) times daily. (Patient taking differently: Take 100-200 mg by mouth daily as needed for mild constipation. )   . esomeprazole (NEXIUM) 40 MG capsule TAKE 1 CAPSULE (40 MG TOTAL) BY MOUTH 2 (TWO) TIMES DAILY. (Patient taking differently: Take 40 mg by mouth 2 (two) times daily before a meal. TAKE 1 CAPSULE (40 MG TOTAL) BY MOUTH 2 (TWO) TIMES DAILY.)   . Eszopiclone (ESZOPICLONE) 3 MG TABS Take 3 mg by mouth at bedtime as needed (insomnia).    . fludrocortisone (FLORINEF) 0.1 MG tablet TAKE 1 TABLET BY MOUTH TWICE A DAY (Patient taking differently: 1 tablet at bedtime)   . fluticasone (FLONASE) 50 MCG/ACT nasal spray Place 2 sprays into both nostrils daily as needed for allergies or rhinitis.    . hydrocortisone 2.5 % cream Apply 1 application topically 2 (two) times daily as needed. dermatitis   .  isosorbide mononitrate (IMDUR) 60 MG 24 hr tablet TAKE 1 TABLET BY MOUTH EVERY DAY   . L-Methylfolate (DEPLIN) 7.5 MG TABS Take 7.5 mg by mouth daily with breakfast.    . LYRICA 75 MG capsule Take 75 mg by mouth 3 (three) times daily.    . methocarbamol (ROBAXIN) 500 MG tablet Take 1 tablet (500 mg total) by mouth every 6 (six) hours as needed for muscle spasms.   . metoprolol (LOPRESSOR) 50 MG tablet TAKE 1 TABLET TWICE A DAY   . Misc Natural Products (GLUCOS-CHONDROIT-MSM COMPLEX PO) Take 2 tablets by mouth daily.   . montelukast (SINGULAIR) 10 MG tablet TAKE 1 TABLET (10 MG TOTAL) BY MOUTH AT  BEDTIME.   . Multiple Vitamin (MULITIVITAMIN WITH MINERALS) TABS Take 1 tablet by mouth daily.     . nitroGLYCERIN (NITROSTAT) 0.3 MG SL tablet Place 0.3 mg under the tongue every 5 (five) minutes as needed for chest pain (as needed).   . ondansetron (ZOFRAN) 4 MG tablet Take 4 mg by mouth every 4 (four) hours as needed for nausea or vomiting.   . OXYCODONE HCL PO Take 30 mg by mouth daily. 12/07/2016: 30 mg every 4 hours as needed  . OXYGEN Inhale 2 L into the lungs at bedtime.   . polyethylene glycol (MIRALAX / GLYCOLAX) packet Take 17 g by mouth 2 (two) times daily. (Patient taking differently: Take 17 g by mouth daily as needed for mild constipation. )   . potassium chloride SA (KLOR-CON M20) 20 MEQ tablet TAKE 1 TABLET (20 MEQ TOTAL) BY MOUTH DAILY. (Patient taking differently: Take 20 mEq by mouth daily. )   . progesterone (PROMETRIUM) 200 MG capsule TAKE 1 CAPSULE BY MOUTH EVERY DAY FOR FIRST 12 DAYS OF MONTH   . promethazine (PHENERGAN) 25 MG tablet TAKE 1 TABLET BY MOUTH AT BEDTIME AS NEEDED FOR NAUSEA AND VOMITING   . sucralfate (CARAFATE) 1 GM/10ML suspension Take 10 mLs (1 g total) by mouth 4 (four) times daily -  with meals and at bedtime. Reported on 01/05/2016   . NONFORMULARY OR COMPOUNDED ITEM Estradiol .02% 1 ML Prefilled Applicator Sig: apply vaginally twice a week #90 Day Supply with 4 refills (Patient not taking: Reported on 12/07/2016) 10/05/2016: Oh hold   . [DISCONTINUED] benzonatate (TESSALON) 200 MG capsule Take 1 capsule (200 mg total) by mouth 3 (three) times daily as needed for cough.   . [DISCONTINUED] doxycycline (VIBRAMYCIN) 100 MG capsule Take 1 capsule (100 mg total) by mouth 2 (two) times daily.   . [DISCONTINUED] lidocaine (LIDODERM) 5 % Place 2 patches onto the skin daily as needed (for pain). Remove & Discard patch within 12 hours or as directed by MD   . [DISCONTINUED] predniSONE (DELTASONE) 10 MG tablet Take  4 each am x 2 days,   2 each am x 2 days,  1 each am x 2  days and stop    No facility-administered encounter medications on file as of 12/07/2016.    Assessment:  Drugs sorted by system:  Neurologic/Psychologic: eszopiclone  Cardiovascular: aspirin, atorvastatin, fludrocortisone, isosorbide, metoprolol, Nitrostat  Pulmonary/Allergy: Proair, albuterol nebulizer solution, Symbicort, fluticasone, montelukast  Gastrointestinal: docusate, Nexium, ondansetron, promethazine, Miralax, Carafate  Topical: clobetasol cream, hydrocortisone cream  Pain: acetaminophen, Lyrica, methocarbamol, oxycodone  Vitamins/Minerals: calcium + Vitamin D, Vitamin B12, multivitamin, Klor-Con  Miscellaneous: cinnamon, l-methylfolate, glucosamine-chondroitin, progesterone   Duplications in therapy: none noted Gaps in therapy: none noted Drug interactions:  . Potassium + promethazine: Anticholinergic Agents may enhance  the ulcerogenic effect of Potassium Chloride. Counseled patient regarding risk and method for preparing aqueous suspension. . Eszopiclone +Lyrica + methocarbamol + oxycodone: CNS depressants may enhance the CNS depressant effect of other CNS depressants. Patient counseled. Other issues noted: Medication affordability   Plan:  1) Patient to dissolve and take potassium tablets as directed in product labeling in suspension form to ensure that the medication is fully swallowed.  2) Patient to follow up with provider about continued need/benefit of Deplin.  3) Patient to call [facility] to start using mail order for cost savings. Patient to also call to find out her over the counter benefit for the year.  4) Patient to call the Patient Access Network (PAN) Foundation to enroll in asthma assistance program  5) Patient to call to follow up after meeting with Dr. Melvyn Novas if switched back to [facility] inhaler or after reaching out of pocket requirement if continued on Symbicort inhaler for assistance with completing patient assistance paperwork.  6) Will close  pharmacy episode at this time.  Harlow Asa, PharmD, [place] Management [phone]

## 2016-12-10 ENCOUNTER — Other Ambulatory Visit: Payer: Self-pay | Admitting: *Deleted

## 2016-12-10 ENCOUNTER — Other Ambulatory Visit (INDEPENDENT_AMBULATORY_CARE_PROVIDER_SITE_OTHER): Payer: Medicare Other

## 2016-12-10 DIAGNOSIS — E785 Hyperlipidemia, unspecified: Secondary | ICD-10-CM | POA: Diagnosis not present

## 2016-12-10 DIAGNOSIS — I10 Essential (primary) hypertension: Secondary | ICD-10-CM

## 2016-12-10 LAB — BASIC METABOLIC PANEL
BUN: 8 mg/dL (ref 6–23)
CHLORIDE: 103 meq/L (ref 96–112)
CO2: 35 meq/L — AB (ref 19–32)
Calcium: 8.8 mg/dL (ref 8.4–10.5)
Creatinine, Ser: 0.53 mg/dL (ref 0.40–1.20)
GFR: 125.61 mL/min (ref >60.00)
Glucose, Bld: 90 mg/dL (ref 70–99)
POTASSIUM: 3.7 meq/L (ref 3.5–5.1)
SODIUM: 144 meq/L (ref 135–145)

## 2016-12-10 LAB — LIPID PANEL
Cholesterol: 102 mg/dL (ref 0–200)
HDL: 35.7 mg/dL — AB (ref >39.00)
LDL Cholesterol: 52 mg/dL (ref 0–99)
NONHDL: 66.69
Total CHOL/HDL Ratio: 3
Triglycerides: 72 mg/dL (ref 0.0–149.0)
VLDL: 14.4 mg/dL (ref 0.0–40.0)

## 2016-12-10 NOTE — Patient Outreach (Signed)
[place] [facility]) Care Management  12/10/2016  Shelby Anderson 1956/10/07 [id number]   Phone call to patient with new contact phone number provided by [facility] Pharmacist [phone].  Voicemail received. HIPPA compliant message left requesting a return call.   Plan: This Education officer, museum will await patient's call.   [name] [facility] Care Management [phone]

## 2016-12-11 ENCOUNTER — Encounter: Payer: Self-pay | Admitting: Pulmonary Disease

## 2016-12-11 ENCOUNTER — Ambulatory Visit (INDEPENDENT_AMBULATORY_CARE_PROVIDER_SITE_OTHER): Payer: Medicare Other | Admitting: Pulmonary Disease

## 2016-12-11 DIAGNOSIS — J4530 Mild persistent asthma, uncomplicated: Secondary | ICD-10-CM | POA: Diagnosis not present

## 2016-12-11 DIAGNOSIS — J45991 Cough variant asthma: Secondary | ICD-10-CM

## 2016-12-11 NOTE — Patient Instructions (Addendum)
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

## 2016-12-11 NOTE — Progress Notes (Signed)
   Subjective:    Patient ID: Shelby Anderson, female    DOB: 14-Nov-1956, 61 y.o.   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  She was evaluated for CAD  at [facility] for syncope & has a loop recorder - brady & int a - fibn Her husband smokes. She has positive PPD.  12/11/2016  Chief Complaint  Patient presents with  . Follow-up    for asthma and sinuses. Wants a MRI of her sinuses. Breathing has been somewhat ok during the day. Breathing gets worse at night. Uses oxygen at night. Nose gets too dry at night with oxygen and moisture.    She is compliant with nocturnal oxygen She underwent left total knee replacement-uneventful surgery and is improving with physical therapy not related to the cane She continues to complain of cough-had 2 office visits in November and December for flareup and was treated with antibiotic and steroids, also given the same by her PCP recently. She was switched by my partner from [place] to [place] has not been able to tell a difference, however Symbicort is cheaper  I note presence of distal esophageal stricture in 12/2013 which was dilated by GI-she has not seen them in more than a year, she does report worsening symptoms of heartburn     Significant tests/ events  Spirometry 12/2011 - no airway obstruction -No reversibility with bronchodilator , mod restriction   CXR LLL nodular infx - resolved 04/2012  Home sleep test 07/2012 - showed desatn,no OSA  12/2013 -admitted  asthma exacerbation,CAP, RSV bronchitis.  EGD 03/2011 ([facility]) - peptic ulcer disease.  12/2013  barium esophagram - distal esophageal stricture.   stricture dilated ([facility])  11/2015 ONO positive , Started on oxygen 2l/m .  10/2016 CT angio neg PE  Review of Systems neg for any significant sore  throat, dysphagia, itching, sneezing, nasal congestion or excess/ purulent secretions, fever, chills, sweats, unintended wt loss, pleuritic or exertional cp, hempoptysis, orthopnea pnd or change in chronic leg swelling. Also denies presyncope, palpitations, heartburn, abdominal pain, nausea, vomiting, diarrhea or change in bowel or urinary habits, dysuria,hematuria, rash, arthralgias, visual complaints, headache, numbness weakness or ataxia.     Objective:   Physical Exam   Gen. Pleasant, well-nourished, in no distress ENT - no lesions, no post nasal drip Neck: No JVD, no thyromegaly, no carotid bruits Lungs: no use of accessory muscles, no dullness to percussion, clear without rales or rhonchi  Cardiovascular: Rhythm regular, heart sounds  normal, no murmurs or gallops, no peripheral edema Musculoskeletal: No deformities, no cyanosis or clubbing         Assessment & Plan:

## 2016-12-11 NOTE — Assessment & Plan Note (Signed)
Cough maybe related to sinus drip or reflux related to your stricture in the past  Proceed with limited CT scan of the sinuses noncontrast  Barium swallow testing to check for stricture in the food pipe

## 2016-12-11 NOTE — Assessment & Plan Note (Signed)
Stay on Symbicort 2 puffs twice daily -rinse mouth after use

## 2016-12-12 DIAGNOSIS — M179 Osteoarthritis of knee, unspecified: Secondary | ICD-10-CM | POA: Diagnosis not present

## 2016-12-12 DIAGNOSIS — G894 Chronic pain syndrome: Secondary | ICD-10-CM | POA: Diagnosis not present

## 2016-12-12 DIAGNOSIS — M1712 Unilateral primary osteoarthritis, left knee: Secondary | ICD-10-CM | POA: Diagnosis not present

## 2016-12-12 DIAGNOSIS — M47816 Spondylosis without myelopathy or radiculopathy, lumbar region: Secondary | ICD-10-CM | POA: Diagnosis not present

## 2016-12-12 DIAGNOSIS — Z79891 Long term (current) use of opiate analgesic: Secondary | ICD-10-CM | POA: Diagnosis not present

## 2016-12-14 ENCOUNTER — Ambulatory Visit (HOSPITAL_COMMUNITY): Payer: Medicare Other

## 2016-12-14 DIAGNOSIS — M1712 Unilateral primary osteoarthritis, left knee: Secondary | ICD-10-CM | POA: Diagnosis not present

## 2016-12-16 ENCOUNTER — Other Ambulatory Visit: Payer: Self-pay | Admitting: Family Medicine

## 2016-12-17 DIAGNOSIS — M1712 Unilateral primary osteoarthritis, left knee: Secondary | ICD-10-CM | POA: Diagnosis not present

## 2016-12-17 NOTE — Telephone Encounter (Signed)
Shelby Anderson, This medication is typically managed and prescribed by a cardiologist. Would advise she request refill from cardiologist. Let us know if any difficulty obtaining refill. Thanks.

## 2016-12-18 ENCOUNTER — Other Ambulatory Visit: Payer: Self-pay | Admitting: Internal Medicine

## 2016-12-18 NOTE — Telephone Encounter (Signed)
I called the pt and informed her of the message below and she stated she is not sure why the pharmacy contacted our office for a refill as she does not need this.

## 2016-12-19 ENCOUNTER — Other Ambulatory Visit: Payer: Self-pay | Admitting: *Deleted

## 2016-12-19 NOTE — Patient Outreach (Signed)
[facility]) Care Management  12/19/2016  PERSEPHONIE WORMAN 1956-02-24 [id number]   Phone call to patient to provide community resource information for food resources.  Per patient, her 2 grandchildren reside with her, however receives no financial assistance from their parents.  Per patient, she is not eligible for any county assistance as she does not have legal guardianship of the children. Patient has discussed obtaining guardianship of her grandchildren as the 100 year old has lived with her since birth and the 59 year old since she was 104 month old, however  has been strongly discouraged. Patient described increase stress and anxiety in caring for her grandchildren and coping with her own medical condition.  Per patient, the children do have medicaid but due to her income, they are not eligible for food stamps.  Keeping enough food in the house is a major issue.  Financial Assessment completed.  Home visit scheduled for 12/27/16 at 11:0am Family counseling strongly encouraged. Mental health resources to be provided as well as resources for food banks and pantries.   [name] [facility] Care Management [phone]

## 2016-12-20 ENCOUNTER — Ambulatory Visit (HOSPITAL_COMMUNITY): Payer: Medicare Other

## 2016-12-20 ENCOUNTER — Inpatient Hospital Stay (HOSPITAL_COMMUNITY): Admission: RE | Admit: 2016-12-20 | Payer: Medicare Other | Source: Ambulatory Visit

## 2016-12-21 ENCOUNTER — Other Ambulatory Visit: Payer: Self-pay | Admitting: *Deleted

## 2016-12-21 NOTE — Patient Outreach (Signed)
[facility]) Care Management  12/21/2016  Shelby Anderson 1956-09-15 [id number]   Phone call to patient to change the time of home visit scheduled for 12/27/16.  Voicemail message left for a return call.   [name] [facility] Care Management [phone]

## 2016-12-23 ENCOUNTER — Other Ambulatory Visit: Payer: Self-pay | Admitting: Pulmonary Disease

## 2016-12-23 DIAGNOSIS — J45909 Unspecified asthma, uncomplicated: Secondary | ICD-10-CM | POA: Diagnosis not present

## 2016-12-25 DIAGNOSIS — M1712 Unilateral primary osteoarthritis, left knee: Secondary | ICD-10-CM | POA: Diagnosis not present

## 2016-12-27 ENCOUNTER — Ambulatory Visit: Payer: Self-pay | Admitting: *Deleted

## 2016-12-27 ENCOUNTER — Encounter: Payer: Self-pay | Admitting: *Deleted

## 2016-12-27 DIAGNOSIS — M1712 Unilateral primary osteoarthritis, left knee: Secondary | ICD-10-CM | POA: Diagnosis not present

## 2016-12-28 ENCOUNTER — Other Ambulatory Visit: Payer: Self-pay | Admitting: *Deleted

## 2016-12-28 ENCOUNTER — Encounter: Payer: Self-pay | Admitting: *Deleted

## 2016-12-28 NOTE — Patient Outreach (Signed)
[facility]) Care Management  [facility] Social Work  12/28/2016  Shelby Anderson 03-19-1956 [id number]  Subjective:  Patient is a 61 year old female.  Patient discussed difficulty affording her medications, medical co-pays, and providing for her 2 grandchildren that reside with her.  Patient discussed sacrificing the things that she needs, to make sure her grandchildren's needs are met. Per patient, her grandchildren's parents are addicted to drugs and do not provide for the children. Per patient and her spouse, they have looked into filing for guardianship of the children, however cannot afford the court fees.  Patient's family are not in agreement that they file for guardianship although the children have been in their care almost their entire lives.  Objective.  Encounter Medications:  Outpatient Encounter Prescriptions as of 12/28/2016  Medication Sig Note  . acetaminophen (TYLENOL) 500 MG tablet Take 1,000 mg by mouth every 4 (four) hours as needed for fever.   Marland Kitchen albuterol (PROVENTIL) (2.5 MG/3ML) 0.083% nebulizer solution Take 3 mLs (2.5 mg total) by nebulization every 6 (six) hours as needed for wheezing or shortness of breath (J45.40).   Marland Kitchen aspirin 81 MG chewable tablet Chew 1 tablet (81 mg total) by mouth 2 (two) times daily. Take for 4 weeks, then resume regular dose. 12/07/2016: Taking once daily  . atorvastatin (LIPITOR) 40 MG tablet TAKE 1 TABLET BY MOUTH EVERY DAY (Patient taking differently: TAKE 1 TABLET BY MOUTH EVERY night at bedtime)   . budesonide-formoterol (SYMBICORT) 80-4.5 MCG/ACT inhaler Take 2 puffs first thing in am and then another 2 puffs about 12 hours later.   . Calcium Carb-Cholecalciferol (CALCIUM 600+D) 600-800 MG-UNIT TABS Take 1 tablet by mouth daily.   . Cinnamon 500 MG capsule Take 1,000 mg by mouth 2 (two) times daily.    . clobetasol cream (TEMOVATE) 9.44 % Apply 1 application topically See admin instructions. Apply to affected area one to  two times a week as needed for rash.   . Cyanocobalamin (VITAMIN B-12) 5000 MCG TBDP Take 5,000 mcg by mouth daily.    Marland Kitchen docusate sodium (COLACE) 100 MG capsule Take 1 capsule (100 mg total) by mouth 2 (two) times daily. (Patient taking differently: Take 100-200 mg by mouth daily as needed for mild constipation. )   . esomeprazole (NEXIUM) 40 MG capsule TAKE 1 CAPSULE (40 MG TOTAL) BY MOUTH 2 (TWO) TIMES DAILY. (Patient taking differently: Take 40 mg by mouth 2 (two) times daily before a meal. TAKE 1 CAPSULE (40 MG TOTAL) BY MOUTH 2 (TWO) TIMES DAILY.)   . Eszopiclone (ESZOPICLONE) 3 MG TABS Take 3 mg by mouth at bedtime as needed (insomnia).    . fludrocortisone (FLORINEF) 0.1 MG tablet TAKE 1 TABLET BY MOUTH TWICE A DAY (Patient taking differently: 1 tablet at bedtime)   . fluticasone (FLONASE) 50 MCG/ACT nasal spray Place 2 sprays into both nostrils daily as needed for allergies or rhinitis.    . hydrocortisone 2.5 % cream Apply 1 application topically 2 (two) times daily as needed. dermatitis   . isosorbide mononitrate (IMDUR) 60 MG 24 hr tablet TAKE 1 TABLET BY MOUTH EVERY DAY   . L-Methylfolate (DEPLIN) 7.5 MG TABS Take 7.5 mg by mouth daily with breakfast.    . LYRICA 75 MG capsule Take 75 mg by mouth 3 (three) times daily.    . methocarbamol (ROBAXIN) 500 MG tablet Take 1 tablet (500 mg total) by mouth every 6 (six) hours as needed for muscle spasms.   . metoprolol (  LOPRESSOR) 50 MG tablet TAKE 1 TABLET TWICE A DAY   . Misc Natural Products (GLUCOS-CHONDROIT-MSM COMPLEX PO) Take 2 tablets by mouth daily.   . montelukast (SINGULAIR) 10 MG tablet TAKE 1 TABLET (10 MG TOTAL) BY MOUTH AT BEDTIME.   . Multiple Vitamin (MULITIVITAMIN WITH MINERALS) TABS Take 1 tablet by mouth daily.     . nitroGLYCERIN (NITROSTAT) 0.3 MG SL tablet Place 0.3 mg under the tongue every 5 (five) minutes as needed for chest pain (as needed).   . NONFORMULARY OR COMPOUNDED ITEM Estradiol .02% 1 ML Prefilled  Applicator Sig: apply vaginally twice a week #90 Day Supply with 4 refills 10/05/2016: Oh hold   . ondansetron (ZOFRAN) 4 MG tablet Take 4 mg by mouth every 4 (four) hours as needed for nausea or vomiting.   . OXYCODONE HCL PO Take 30 mg by mouth daily. 12/07/2016: 30 mg every 4 hours as needed  . OXYGEN Inhale 2 L into the lungs at bedtime.   . polyethylene glycol (MIRALAX / GLYCOLAX) packet Take 17 g by mouth 2 (two) times daily. (Patient taking differently: Take 17 g by mouth daily as needed for mild constipation. )   . potassium chloride SA (KLOR-CON M20) 20 MEQ tablet TAKE 1 TABLET (20 MEQ TOTAL) BY MOUTH DAILY. (Patient taking differently: Take 20 mEq by mouth daily. )   . PROAIR HFA 108 (90 Base) MCG/ACT inhaler INHALE 2 PUFFS INTO THE LUNGS EVERY 4 (FOUR) HOURS AS NEEDED. SHORTNESS OF BREATH   . progesterone (PROMETRIUM) 200 MG capsule TAKE 1 CAPSULE BY MOUTH EVERY DAY FOR FIRST 12 DAYS OF MONTH   . promethazine (PHENERGAN) 25 MG tablet TAKE 1 TABLET BY MOUTH AT BEDTIME AS NEEDED FOR NAUSEA AND VOMITING   . sucralfate (CARAFATE) 1 GM/10ML suspension Take 10 mLs (1 g total) by mouth 4 (four) times daily -  with meals and at bedtime. Reported on 01/05/2016    No facility-administered encounter medications on file as of 12/28/2016.     Functional Status:  In your present state of health, do you have any difficulty performing the following activities: 12/28/2016 09/25/2016  Hearing? N N  Vision? N N  Difficulty concentrating or making decisions? Y N  Walking or climbing stairs? Y N  Dressing or bathing? Y N  Doing errands, shopping? Y N  Preparing Food and eating ? Y -  Using the Toilet? N -  In the past six months, have you accidently leaked urine? N -  Do you have problems with loss of bowel control? N -  Managing your Medications? N -  Managing your Finances? Y -  Housekeeping or managing your Housekeeping? Y -  Some recent data might be hidden    Fall/Depression Screening:  PHQ  2/9 Scores 12/28/2016 11/15/2016 11/08/2016 01/05/2016  PHQ - 2 Score _0 PHQ- 9 Score _1 Assessment: Patient very friendly and engaging, however frustrated with her financial and family situation.  Considering guardianship of the children in order to make sure their needs are met discussed, however patient and her spouse remain reluctant.  Gift card provided to patient from the [facility] giving fund provided as patient discussed having very little funds to afford food.  Community resources for food pantries and meals in [place] also provided.  Patient does not have an Advanced Directive in place and would like assistance completing one.   Plan:  Home visit to be scheduled within 1 month  to follow up on referrals provided as well as to assist patient with her Advanced Directive.    [name] [facility] Care Management [phone]

## 2017-01-02 ENCOUNTER — Other Ambulatory Visit: Payer: Self-pay | Admitting: Internal Medicine

## 2017-01-03 DIAGNOSIS — M1712 Unilateral primary osteoarthritis, left knee: Secondary | ICD-10-CM | POA: Diagnosis not present

## 2017-01-04 ENCOUNTER — Ambulatory Visit (HOSPITAL_COMMUNITY)
Admission: RE | Admit: 2017-01-04 | Discharge: 2017-01-04 | Disposition: A | Discharge status: Home / Self Care | Payer: Medicare Other | Source: Ambulatory Visit | Attending: Pulmonary Disease | Admitting: Pulmonary Disease

## 2017-01-04 DIAGNOSIS — R05 Cough: Secondary | ICD-10-CM | POA: Diagnosis not present

## 2017-01-04 DIAGNOSIS — J45991 Cough variant asthma: Secondary | ICD-10-CM

## 2017-01-04 DIAGNOSIS — K222 Esophageal obstruction: Secondary | ICD-10-CM | POA: Insufficient documentation

## 2017-01-04 DIAGNOSIS — R1310 Dysphagia, unspecified: Secondary | ICD-10-CM | POA: Diagnosis not present

## 2017-01-04 DIAGNOSIS — K449 Diaphragmatic hernia without obstruction or gangrene: Secondary | ICD-10-CM | POA: Diagnosis not present

## 2017-01-07 DIAGNOSIS — M1712 Unilateral primary osteoarthritis, left knee: Secondary | ICD-10-CM | POA: Diagnosis not present

## 2017-01-10 DIAGNOSIS — R079 Chest pain, unspecified: Secondary | ICD-10-CM | POA: Diagnosis not present

## 2017-01-10 DIAGNOSIS — I10 Essential (primary) hypertension: Secondary | ICD-10-CM | POA: Diagnosis not present

## 2017-01-10 DIAGNOSIS — G8929 Other chronic pain: Secondary | ICD-10-CM | POA: Diagnosis not present

## 2017-01-10 DIAGNOSIS — I951 Orthostatic hypotension: Secondary | ICD-10-CM | POA: Diagnosis not present

## 2017-01-11 DIAGNOSIS — G894 Chronic pain syndrome: Secondary | ICD-10-CM | POA: Diagnosis not present

## 2017-01-11 DIAGNOSIS — Z79891 Long term (current) use of opiate analgesic: Secondary | ICD-10-CM | POA: Diagnosis not present

## 2017-01-11 DIAGNOSIS — M47816 Spondylosis without myelopathy or radiculopathy, lumbar region: Secondary | ICD-10-CM | POA: Diagnosis not present

## 2017-01-11 DIAGNOSIS — M179 Osteoarthritis of knee, unspecified: Secondary | ICD-10-CM | POA: Diagnosis not present

## 2017-01-12 LAB — HEMOGLOBIN A1C: HEMOGLOBIN A1C: 5.9

## 2017-01-17 ENCOUNTER — Other Ambulatory Visit: Payer: Self-pay | Admitting: *Deleted

## 2017-01-17 DIAGNOSIS — R0602 Shortness of breath: Secondary | ICD-10-CM | POA: Diagnosis not present

## 2017-01-17 DIAGNOSIS — I10 Essential (primary) hypertension: Secondary | ICD-10-CM | POA: Diagnosis not present

## 2017-01-17 NOTE — Telephone Encounter (Signed)
This encounter was created in error - please disregard.

## 2017-01-17 NOTE — Patient Outreach (Signed)
[facility]) Care Management  01/17/2017  Shelby Anderson 1956/08/07 [id number]   Phone call to patient, home visit scheduled for 01/21/17 to complete patient's Advanced Directive and to assess for further social work needs before closing patient's case.   Plan: Home visit scheduled for 01/21/17.   [name] [facility] Care Management [phone]

## 2017-01-21 ENCOUNTER — Other Ambulatory Visit: Payer: Self-pay | Admitting: *Deleted

## 2017-01-21 ENCOUNTER — Encounter: Payer: Self-pay | Admitting: *Deleted

## 2017-01-21 NOTE — Patient Outreach (Signed)
[facility]) Care Management  [facility] Social Work  01/21/2017  Shelby Anderson 1955-12-07 [id number]  Subjective:  Patient reports having left knee pain today. Reports attending a funeral that required a lot of walking. Patient reports continuing to care for her 2 grandchildren. Patient has not pursued guardianship, stating that she would have to hire a lawyer which she cannot afford at this time.    Objective:   Encounter Medications:  Outpatient Encounter Prescriptions as of 01/21/2017  Medication Sig Note  . acetaminophen (TYLENOL) 500 MG tablet Take 1,000 mg by mouth every 4 (four) hours as needed for fever.   Marland Kitchen albuterol (PROVENTIL) (2.5 MG/3ML) 0.083% nebulizer solution Take 3 mLs (2.5 mg total) by nebulization every 6 (six) hours as needed for wheezing or shortness of breath (J45.40).   Marland Kitchen aspirin 81 MG chewable tablet Chew 1 tablet (81 mg total) by mouth 2 (two) times daily. Take for 4 weeks, then resume regular dose. 12/07/2016: Taking once daily  . atorvastatin (LIPITOR) 40 MG tablet TAKE 1 TABLET BY MOUTH EVERY DAY (Patient taking differently: TAKE 1 TABLET BY MOUTH EVERY night at bedtime)   . budesonide-formoterol (SYMBICORT) 80-4.5 MCG/ACT inhaler Take 2 puffs first thing in am and then another 2 puffs about 12 hours later.   . Calcium Carb-Cholecalciferol (CALCIUM 600+D) 600-800 MG-UNIT TABS Take 1 tablet by mouth daily.   . Cinnamon 500 MG capsule Take 1,000 mg by mouth 2 (two) times daily.    . clobetasol cream (TEMOVATE) [id number] % Apply 1 application topically See admin instructions. Apply to affected area one to two times a week as needed for rash.   . Cyanocobalamin (VITAMIN B-12) 5000 MCG TBDP Take 5,000 mcg by mouth daily.    Marland Kitchen docusate sodium (COLACE) 100 MG capsule Take 1 capsule (100 mg total) by mouth 2 (two) times daily. (Patient taking differently: Take 100-200 mg by mouth daily as needed for mild constipation. )   . Eszopiclone (ESZOPICLONE) 3 MG TABS  Take 3 mg by mouth at bedtime as needed (insomnia).    . fludrocortisone (FLORINEF) 0.1 MG tablet TAKE 1 TABLET BY MOUTH TWICE A DAY (Patient taking differently: 1 tablet at bedtime)   . fluticasone (FLONASE) 50 MCG/ACT nasal spray Place 2 sprays into both nostrils daily as needed for allergies or rhinitis.    . hydrocortisone 2.5 % cream Apply 1 application topically 2 (two) times daily as needed. dermatitis   . isosorbide mononitrate (IMDUR) 60 MG 24 hr tablet TAKE 1 TABLET BY MOUTH EVERY DAY   . L-Methylfolate (DEPLIN) 7.5 MG TABS Take 7.5 mg by mouth daily with breakfast.    . LYRICA 75 MG capsule Take 75 mg by mouth 3 (three) times daily.    . methocarbamol (ROBAXIN) 500 MG tablet Take 1 tablet (500 mg total) by mouth every 6 (six) hours as needed for muscle spasms.   . metoprolol (LOPRESSOR) 50 MG tablet TAKE 1 TABLET TWICE A DAY   . Misc Natural Products (GLUCOS-CHONDROIT-MSM COMPLEX PO) Take 2 tablets by mouth daily.   . montelukast (SINGULAIR) 10 MG tablet TAKE 1 TABLET (10 MG TOTAL) BY MOUTH AT BEDTIME.   . Multiple Vitamin (MULITIVITAMIN WITH MINERALS) TABS Take 1 tablet by mouth daily.     Marland Kitchen NEXIUM 40 MG capsule TAKE ONE CAPSULE BY MOUTH TWICE A DAY   . nitroGLYCERIN (NITROSTAT) 0.3 MG SL tablet Place 0.3 mg under the tongue every 5 (five) minutes as needed for chest pain (as needed).   Marland Kitchen  NONFORMULARY OR COMPOUNDED ITEM Estradiol .02% 1 ML Prefilled Applicator Sig: apply vaginally twice a week #90 Day Supply with 4 refills 10/05/2016: Oh hold   . ondansetron (ZOFRAN) 4 MG tablet Take 4 mg by mouth every 4 (four) hours as needed for nausea or vomiting.   . OXYCODONE HCL PO Take 30 mg by mouth daily. 12/07/2016: 30 mg every 4 hours as needed  . OXYGEN Inhale 2 L into the lungs at bedtime.   . polyethylene glycol (MIRALAX / GLYCOLAX) packet Take 17 g by mouth 2 (two) times daily. (Patient taking differently: Take 17 g by mouth daily as needed for mild constipation. )   . potassium  chloride SA (KLOR-CON M20) 20 MEQ tablet TAKE 1 TABLET (20 MEQ TOTAL) BY MOUTH DAILY. (Patient taking differently: Take 20 mEq by mouth daily. )   . PROAIR HFA 108 (90 Base) MCG/ACT inhaler INHALE 2 PUFFS INTO THE LUNGS EVERY 4 (FOUR) HOURS AS NEEDED. SHORTNESS OF BREATH   . progesterone (PROMETRIUM) 200 MG capsule TAKE 1 CAPSULE BY MOUTH EVERY DAY FOR FIRST 12 DAYS OF MONTH   . promethazine (PHENERGAN) 25 MG tablet TAKE 1 TABLET BY MOUTH AT BEDTIME AS NEEDED FOR NAUSEA AND VOMITING   . sucralfate (CARAFATE) 1 GM/10ML suspension Take 10 mLs (1 g total) by mouth 4 (four) times daily -  with meals and at bedtime. Reported on 01/05/2016    No facility-administered encounter medications on file as of 01/21/2017.     Functional Status:  In your present state of health, do you have any difficulty performing the following activities: 12/28/2016 09/25/2016  Hearing? N N  Vision? N N  Difficulty concentrating or making decisions? Y N  Walking or climbing stairs? Y N  Dressing or bathing? Y N  Doing errands, shopping? Y N  Preparing Food and eating ? Y -  Using the Toilet? N -  In the past six months, have you accidently leaked urine? N -  Do you have problems with loss of bowel control? N -  Managing your Medications? N -  Managing your Finances? Y -  Housekeeping or managing your Housekeeping? Y -  Some recent data might be hidden    Fall/Depression Screening:  PHQ 2/9 Scores 12/28/2016 11/15/2016 11/08/2016 01/05/2016  PHQ - 2 Score 2 6 6 6   PHQ- 9 Score 8 20 15 22     Assessment:  Patient was in good spirits today. Advanced Directive and Living Will completed. Patient understands that the document needs to be notarized and copies need to be given to her provider's office. Patient verbalized having no additional community resource needs.  [employer] for food pantries and meals in [place] previously provided.  Plan: Patient's case to be closed at this time to [facility]  management.   [name] [facility] Care Management [phone]

## 2017-01-22 DIAGNOSIS — R079 Chest pain, unspecified: Secondary | ICD-10-CM | POA: Diagnosis not present

## 2017-01-22 DIAGNOSIS — Z8719 Personal history of other diseases of the digestive system: Secondary | ICD-10-CM | POA: Diagnosis not present

## 2017-01-22 DIAGNOSIS — G8929 Other chronic pain: Secondary | ICD-10-CM | POA: Diagnosis not present

## 2017-01-22 DIAGNOSIS — I2781 Cor pulmonale (chronic): Secondary | ICD-10-CM | POA: Diagnosis not present

## 2017-01-23 DIAGNOSIS — J45909 Unspecified asthma, uncomplicated: Secondary | ICD-10-CM | POA: Diagnosis not present

## 2017-02-06 ENCOUNTER — Other Ambulatory Visit: Payer: Self-pay | Admitting: Family Medicine

## 2017-02-07 ENCOUNTER — Encounter: Payer: Self-pay | Admitting: Family Medicine

## 2017-02-07 ENCOUNTER — Ambulatory Visit (INDEPENDENT_AMBULATORY_CARE_PROVIDER_SITE_OTHER): Payer: Medicare Other | Admitting: Family Medicine

## 2017-02-07 DIAGNOSIS — G8929 Other chronic pain: Secondary | ICD-10-CM | POA: Diagnosis not present

## 2017-02-07 DIAGNOSIS — IMO0002 Reserved for concepts with insufficient information to code with codable children: Secondary | ICD-10-CM

## 2017-02-07 DIAGNOSIS — I420 Dilated cardiomyopathy: Secondary | ICD-10-CM | POA: Diagnosis not present

## 2017-02-07 DIAGNOSIS — E1165 Type 2 diabetes mellitus with hyperglycemia: Secondary | ICD-10-CM

## 2017-02-07 DIAGNOSIS — J9611 Chronic respiratory failure with hypoxia: Secondary | ICD-10-CM

## 2017-02-07 DIAGNOSIS — I10 Essential (primary) hypertension: Secondary | ICD-10-CM | POA: Diagnosis not present

## 2017-02-07 DIAGNOSIS — J45991 Cough variant asthma: Secondary | ICD-10-CM | POA: Diagnosis not present

## 2017-02-07 DIAGNOSIS — E1140 Type 2 diabetes mellitus with diabetic neuropathy, unspecified: Secondary | ICD-10-CM

## 2017-02-07 DIAGNOSIS — I2510 Atherosclerotic heart disease of native coronary artery without angina pectoris: Secondary | ICD-10-CM | POA: Diagnosis not present

## 2017-02-07 DIAGNOSIS — G629 Polyneuropathy, unspecified: Secondary | ICD-10-CM | POA: Diagnosis not present

## 2017-02-07 DIAGNOSIS — M545 Low back pain: Secondary | ICD-10-CM

## 2017-02-07 DIAGNOSIS — F330 Major depressive disorder, recurrent, mild: Secondary | ICD-10-CM

## 2017-02-07 NOTE — Progress Notes (Signed)
Pre visit review using our clinic review tool, if applicable. No additional management support is needed unless otherwise documented below in the visit note. 

## 2017-02-07 NOTE — Patient Instructions (Signed)
BEFORE YOU LEAVE: -abstract [id number] -follow up: 3 months  I am glad you are feeling better. Do start to ease into exercise - goal 150 minutes per week minimum.  Consider the Mediterranean diet - brief summary below.  Consider trying a very slow wean off of your opoid pain medications with your pain specialist to see if you feel better off of them.   We recommend the following healthy lifestyle for LIFE: 1) Small portions.   Tip: eat off of a salad plate instead of a dinner plate.  Tip: It is ok to feel hungry after a meal of proper portion sizes.  Tip: if you need more or a snack choose fruits, veggies and/or a handful of nuts or seeds.  2) Eat a healthy clean diet.  * Tip: Avoid (less then 1 serving per week): processed foods, sweets, sweetened drinks, white starches (rice, flour, bread, potatoes, pasta, etc), red meat, fast foods, butter  *Tip: CHOOSE instead   * 5-9 servings per day of fresh or frozen fruits and vegetables (but not corn, potatoes, bananas, canned or dried fruit)   *nuts and seeds, beans   *olives and olive oil   *small portions of lean meats such as fish and white chicken    *small portions of whole grains  3)Get at least 150 minutes of sweaty aerobic exercise per week.  4)Reduce stress - consider counseling, meditation and relaxation to balance other aspects of your life.

## 2017-02-07 NOTE — Progress Notes (Signed)
HPI:  Shelby Anderson is a pleasant 61 y.o. here for follow up. She unfortunately has a very complicated PMH. Chronic medical problems summarized below were reviewed for changes and stability and were updated as needed below. These issues and their treatment remain stable for the most part. She has been struggling with some dyspnea for some time and had an evaluation with pulmonology and then her cardiologist recent. Reports a recent echo showed new or worsening dilated atrium and ventricle and she is being weaned off of florinef. She reports despite this she actually is feeling better then she has in a while. Mood is improved. She is trying to eat better and is planning to start exercising. She has had chronic cold Anderson and feet for > 12 years whenever in the cold and wonders if there is something she can do about this without medications. She had a Hgba1c with an insurance nurse physical about 1 month ago - it was 5.9. Denies CP, SOB, DOE, treatment intolerance or new symptoms. Due for hgba1c  Diabetes Mellitus: -borderline/mild diabetic for some time -takes cinnamon -no regular exercise, but she is active taking care of grandchildren -hx CAD (sees cardiologist), HLD, HTN, orthostattic hypotension - on statin and asa, not on acei - has asthma  GERD: -hx bad reflux, esophageal stricture s/p dilation and gastric ulcer -sees Dr. Henrene Pastor for this  -on nexium 40mg  bid, carafate, prn phenergan  CAD hx MI x2 per her report/Arrythmia/HTN/HLD/orthostatic hypotension/presyncope: -seeing several cardiologists -meds: asa, metoprolol, fish oil, isosorbide, florinef, K+, lipitor, ntg prn -denies: CP, SOB, palpitations  Asthma/Allergies: -sees pulmonologist for this -meds: singulair, symbicort, albuterol  Insomnia: -uses benydrl occ for this -eszopiclone on med list from ? [facility] -hx of low O2 at night  Chronic back pain/Knee pain: -hx DDD and neuropathy, OA knees -seeing [facility]  pain management specialist and referred to ortho -on daily oxycodone 30mg  3 times daily, lyrica- doesn't think any of them help; reports pain doctor doesn't want her to stop them, on methadone in the past and opana - reports reacted to them  -other meds: robaxin, mobix, Deplin, tylenol -aware of risks with medications, adamant about continuing mobic as feels is only medication that helps  MDD: -reports on deplin from pain management for this, does not feel helps -declined other treatments offered here -Chronic symptoms: mildly constant depressed mood and hopeless feelings at times, no SI, panic or manic symptoms  HRT/postmenopausl bleeding: -on prometrium with her gynecologist, Dr. Toney Rakes  ROS: See pertinent positives and negatives per HPI.  Past Medical History:  Diagnosis Date  . Anemia    "chronic"  . Angina   . Anxiety and depression   . Atrial fibrillation ([place])    h/o "AF w/frequent PVCs"  . Chronic back pain greater than 3 months duration    on chronic narcotics, treated at pain clinic  . Colon polyps    hyperplastic  . Coronary artery disease    Arrythmia, orthostatic hypotension, HLD, HTN; sees Dr. Einar Gip  . Difficult intubation    "TMJ & woke up when they were still cutting on me"  . Dysrhythmia    sees Dr. Einar Gip and a cardiologist at [facility]  . Esophageal stricture   . Fatty liver   . Fibroids   . Fibromyalgia    "in my legs"  . GERD (gastroesophageal reflux disease)    hx hiatal hernia, stricture and gastric ulcer  . Headache(784.0)   . Heart murmur   . Hiatal hernia   .  History of loop recorder   . History of migraines    "dx'd when I was in my teens"  . Hyperlipemia   . Hypertension   . Mental disorder   . Mild episode of recurrent major depressive disorder ([place]) 12/06/2015  . Myocardial infarction 1980's & 1990;   sees Dr. Einar Gip  . Pneumonia   . PONV (postoperative nausea and vomiting)   . Recurrent upper respiratory infection (URI)   .  Shortness of breath 11/20/11   "all the time", sees pulmonlogy, ? asthma  . Stenotic cervical os   . Stomach ulcer    "3 small; found in 05/2011"  . TMJ (dislocation of temporomandibular joint)   . Tuberculosis    + TB SKIN TEST  . Type 2 diabetes mellitus without complication, without long-term current use of insulin ([place]) 12/06/2015    Past Surgical History:  Procedure Laterality Date  . ACHILLES TENDON REPAIR  1970's   left ankle  . ARTHROSCOPIC REPAIR ACL     left knee cap  . CARDIAC CATHETERIZATION     loop recorder  . CARPAL TUNNEL RELEASE  unknown   left hand  . LOOP RECORDER IMPLANT    . post ganglionectomy  1970's   "for migraine headaches"  . pouch string  [phone]   "did this 3 times (once w/each pregnancy)"  . TOTAL KNEE ARTHROPLASTY Left 09/25/2016   Procedure: LEFT TOTAL KNEE ARTHROPLASTY;  Surgeon: Paralee Cancel, MD;  Location: [facility];  Service: Orthopedics;  Laterality: Left;  . TUBAL LIGATION  1980's    Family History  Problem Relation Age of Onset  . Malignant hyperthermia Father   . Hypertension Father   . Heart disease Father   . Diabetes Father   . Cancer Father     skin  . Hypertension Mother   . Heart disease Mother   . Cancer Sister     CERVICAL  . Hypertension Sister   . Cancer Brother     MELANOMA  . Heart disease Maternal Grandmother   . Heart disease Maternal Grandfather   . Cancer Paternal Grandmother     ?   Marland Kitchen Heart disease Paternal Grandmother   . Heart disease Paternal Grandfather   . Cancer Brother     LUNG  . Diabetes Sister   . Hypertension Sister   . Heart disease Sister   . Cancer Sister   . Cancer Brother   . Anesthesia problems Neg Hx   . Hypotension Neg Hx   . Pseudochol deficiency Neg Hx     Social History   Social History  . Marital status: Married    Spouse name: N/A  . Number of children: 3  . Years of education: N/A   Occupational History  . Retired Therapist, sports    Social History Main Topics  . Smoking status:  Never Smoker  . Smokeless tobacco: Never Used  . Alcohol use No  . Drug use: No  . Sexual activity: Yes    Birth control/ protection: Surgical   Other Topics Concern  . None   Social History Narrative  . None     Current Outpatient Prescriptions:  .  acetaminophen (TYLENOL) 500 MG tablet, Take 1,000 mg by mouth every 4 (four) hours as needed for fever., Disp: , Rfl:  .  albuterol (PROVENTIL) (2.5 MG/3ML) 0.083% nebulizer solution, Take 3 mLs (2.5 mg total) by nebulization every 6 (six) hours as needed for wheezing or shortness of breath (J45.40)., Disp: 75 mL, Rfl:  3 .  aspirin 81 MG chewable tablet, Chew 1 tablet (81 mg total) by mouth 2 (two) times daily. Take for 4 weeks, then resume regular dose., Disp: 60 tablet, Rfl: 0 .  atorvastatin (LIPITOR) 40 MG tablet, TAKE 1 TABLET BY MOUTH EVERY DAY, Disp: 90 tablet, Rfl: 1 .  budesonide-formoterol (SYMBICORT) 80-4.5 MCG/ACT inhaler, Take 2 puffs first thing in am and then another 2 puffs about 12 hours later., Disp: 1 Inhaler, Rfl: 11 .  Calcium Carb-Cholecalciferol (CALCIUM 600+D) 600-800 MG-UNIT TABS, Take 1 tablet by mouth daily., Disp: , Rfl:  .  Cinnamon 500 MG capsule, Take 1,000 mg by mouth 2 (two) times daily. , Disp: , Rfl:  .  clobetasol cream (TEMOVATE) 2.95 %, Apply 1 application topically See admin instructions. Apply to affected area one to two times a week as needed for rash., Disp: , Rfl:  .  Cyanocobalamin (VITAMIN B-12) 5000 MCG TBDP, Take 5,000 mcg by mouth daily. , Disp: , Rfl:  .  docusate sodium (COLACE) 100 MG capsule, Take 1 capsule (100 mg total) by mouth 2 (two) times daily. (Patient taking differently: Take 100-200 mg by mouth daily as needed for mild constipation. ), Disp: 10 capsule, Rfl: 0 .  Eszopiclone (ESZOPICLONE) 3 MG TABS, Take 3 mg by mouth at bedtime as needed (insomnia). , Disp: , Rfl:  .  fludrocortisone (FLORINEF) 0.1 MG tablet, TAKE 1 TABLET BY MOUTH TWICE A DAY (Patient taking differently: 1 tablet  at bedtime), Disp: 60 tablet, Rfl: 5 .  fluticasone (FLONASE) 50 MCG/ACT nasal spray, Place 2 sprays into both nostrils daily as needed for allergies or rhinitis. , Disp: , Rfl:  .  hydrocortisone 2.5 % cream, Apply 1 application topically 2 (two) times daily as needed. dermatitis, Disp: , Rfl:  .  isosorbide mononitrate (IMDUR) 60 MG 24 hr tablet, TAKE 1 TABLET BY MOUTH EVERY DAY, Disp: 90 tablet, Rfl: 1 .  L-Methylfolate (DEPLIN) 7.5 MG TABS, Take 7.5 mg by mouth daily with breakfast. , Disp: , Rfl:  .  LYRICA 75 MG capsule, Take 75 mg by mouth 3 (three) times daily. , Disp: , Rfl:  .  methocarbamol (ROBAXIN) 500 MG tablet, Take 1 tablet (500 mg total) by mouth every 6 (six) hours as needed for muscle spasms., Disp: 60 tablet, Rfl: 0 .  metoprolol (LOPRESSOR) 50 MG tablet, TAKE 1 TABLET TWICE A DAY, Disp: 180 tablet, Rfl: 1 .  Misc Natural Products (GLUCOS-CHONDROIT-MSM COMPLEX PO), Take 2 tablets by mouth daily., Disp: , Rfl:  .  montelukast (SINGULAIR) 10 MG tablet, TAKE 1 TABLET (10 MG TOTAL) BY MOUTH AT BEDTIME., Disp: 90 tablet, Rfl: 3 .  Multiple Vitamin (MULITIVITAMIN WITH MINERALS) TABS, Take 1 tablet by mouth daily.  , Disp: , Rfl:  .  NEXIUM 40 MG capsule, TAKE ONE CAPSULE BY MOUTH TWICE A DAY, Disp: 60 capsule, Rfl: 6 .  nitroGLYCERIN (NITROSTAT) 0.3 MG SL tablet, Place 0.3 mg under the tongue every 5 (five) minutes as needed for chest pain (as needed)., Disp: , Rfl:  .  NONFORMULARY OR COMPOUNDED ITEM, Estradiol .02% 1 ML Prefilled Applicator Sig: apply vaginally twice a week #90 Day Supply with 4 refills, Disp: 1 each, Rfl: 4 .  ondansetron (ZOFRAN) 4 MG tablet, Take 4 mg by mouth every 4 (four) hours as needed for nausea or vomiting., Disp: , Rfl:  .  OXYCODONE HCL PO, Take 30 mg by mouth daily., Disp: , Rfl:  .  OXYGEN, Inhale 2 L  into the lungs at bedtime., Disp: , Rfl:  .  polyethylene glycol (MIRALAX / GLYCOLAX) packet, Take 17 g by mouth 2 (two) times daily. (Patient taking  differently: Take 17 g by mouth daily as needed for mild constipation. ), Disp: 14 each, Rfl: 0 .  potassium chloride SA (KLOR-CON M20) 20 MEQ tablet, TAKE 1 TABLET (20 MEQ TOTAL) BY MOUTH DAILY. (Patient taking differently: Take 20 mEq by mouth daily. ), Disp: 30 tablet, Rfl: 3 .  PROAIR HFA 108 (90 Base) MCG/ACT inhaler, INHALE 2 PUFFS INTO THE LUNGS EVERY 4 (FOUR) HOURS AS NEEDED. SHORTNESS OF BREATH, Disp: 8.5 Inhaler, Rfl: 3 .  progesterone (PROMETRIUM) 200 MG capsule, TAKE 1 CAPSULE BY MOUTH EVERY DAY FOR FIRST 12 DAYS OF MONTH, Disp: 36 capsule, Rfl: 2 .  promethazine (PHENERGAN) 25 MG tablet, TAKE 1 TABLET BY MOUTH AT BEDTIME AS NEEDED FOR NAUSEA AND VOMITING, Disp: 30 tablet, Rfl: 1 .  sucralfate (CARAFATE) 1 GM/10ML suspension, Take 10 mLs (1 g total) by mouth 4 (four) times daily -  with meals and at bedtime. Reported on 01/05/2016, Disp: 420 mL, Rfl: 6  EXAM:  Vitals:   02/07/17 1513  BP: 112/82  Pulse: 77  Temp: 98.8 F (37.1 C)    Body mass index is 35.83 kg/m.  GENERAL: vitals reviewed and listed above, alert, oriented, appears well hydrated and in no acute distress  HEENT: atraumatic, conjunttiva clear, no obvious abnormalities on inspection of external nose and ears  NECK: no obvious masses on inspection  LUNGS: clear to auscultation bilaterally, no wheezes, rales or rhonchi, good air movement  CV: HRRR, no peripheral edema  MS: moves all extremities without noticeable abnormality  PSYCH: pleasant and cooperative, no obvious depression or anxiety  ASSESSMENT AND PLAN:  Discussed the following assessment and plan:  Morbid obesity due to excess calories ([place])  Uncontrolled type 2 diabetes with neuropathy ([place])  Essential hypertension  Neuropathy ([facility])  Chronic respiratory failure with hypoxia (H[facility])  Mild episode of recurrent major depressive disorder ([facility])  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  Coronary artery disease involving native  coronary artery of native heart without angina pectoris  Chronic low back pain, unspecified back pain laterality, with sciatica presence unspecified  Dilated cardiomyopathy ([place])  -congratulated and encouraged on efforts for healthier lifestyle - advised healthy Mediterranean diet, regular exercise, small portions, stress management - I will try to see if I can find anything in the literature about nutrition and raynaud's or cold extremities and neuropathy -I feel that she may do better if we can get her on a shorter medication list - she plans to continue to try to work with her specialists to simplify her medication list - the opiod would be my first choice for her to attempt to wean and she agrees she may discuss very slow wean off with her pain doctor -Patient advised to return or notify a doctor immediately if symptoms worsen or persist or new concerns arise.  Patient Instructions  BEFORE YOU LEAVE: -abstract hgba1c -follow up: 3 months  I am glad you are feeling better. Do start to ease into exercise - goal 150 minutes per week minimum.  Consider the Mediterranean diet - brief summary below.  Consider trying a very slow wean off of your opoid pain medications with your pain specialist to see if you feel better off of them.   We recommend the following healthy lifestyle for LIFE: 1) Small portions.   Tip: eat off of  a salad plate instead of a dinner plate.  Tip: It is ok to feel hungry after a meal of proper portion sizes.  Tip: if you need more or a snack choose fruits, veggies and/or a handful of nuts or seeds.  2) Eat a healthy clean diet.  * Tip: Avoid (less then 1 serving per week): processed foods, sweets, sweetened drinks, white starches (rice, flour, bread, potatoes, pasta, etc), red meat, fast foods, butter  *Tip: CHOOSE instead   * 5-9 servings per day of fresh or frozen fruits and vegetables (but not corn, potatoes, bananas, canned or dried fruit)   *nuts and seeds,  beans   *olives and olive oil   *small portions of lean meats such as fish and white chicken    *small portions of whole grains  3)Get at least 150 minutes of sweaty aerobic exercise per week.  4)Reduce stress - consider counseling, meditation and relaxation to balance other aspects of your life.      Colin Benton R., DO

## 2017-02-08 DIAGNOSIS — G894 Chronic pain syndrome: Secondary | ICD-10-CM | POA: Diagnosis not present

## 2017-02-08 DIAGNOSIS — M47816 Spondylosis without myelopathy or radiculopathy, lumbar region: Secondary | ICD-10-CM | POA: Diagnosis not present

## 2017-02-08 DIAGNOSIS — M179 Osteoarthritis of knee, unspecified: Secondary | ICD-10-CM | POA: Diagnosis not present

## 2017-02-08 DIAGNOSIS — Z79891 Long term (current) use of opiate analgesic: Secondary | ICD-10-CM | POA: Diagnosis not present

## 2017-02-18 ENCOUNTER — Other Ambulatory Visit: Payer: Self-pay | Admitting: Family Medicine

## 2017-02-20 DIAGNOSIS — J45909 Unspecified asthma, uncomplicated: Secondary | ICD-10-CM | POA: Diagnosis not present

## 2017-03-10 ENCOUNTER — Other Ambulatory Visit: Payer: Self-pay | Admitting: Internal Medicine

## 2017-03-11 ENCOUNTER — Ambulatory Visit (INDEPENDENT_AMBULATORY_CARE_PROVIDER_SITE_OTHER): Payer: Medicare Other | Admitting: Adult Health

## 2017-03-11 ENCOUNTER — Encounter: Payer: Self-pay | Admitting: Adult Health

## 2017-03-11 VITALS — BP 118/80 | HR 66 | Ht 66.0 in | Wt 225.4 lb

## 2017-03-11 DIAGNOSIS — J301 Allergic rhinitis due to pollen: Secondary | ICD-10-CM | POA: Diagnosis not present

## 2017-03-11 DIAGNOSIS — J45991 Cough variant asthma: Secondary | ICD-10-CM

## 2017-03-11 DIAGNOSIS — K219 Gastro-esophageal reflux disease without esophagitis: Secondary | ICD-10-CM | POA: Diagnosis not present

## 2017-03-11 DIAGNOSIS — J309 Allergic rhinitis, unspecified: Secondary | ICD-10-CM | POA: Insufficient documentation

## 2017-03-11 NOTE — Progress Notes (Signed)
@Patient  ID: Shelby Anderson, female    DOB: 12/03/1956, 61 y.o.   MRN: [id number]  Chief Complaint  Patient presents with  . 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  She was evaluated for CAD  at [facility] for syncope.& has a loop recorder - brady & int a - fibn   Significant tests/ events Spirometry 12/2011 - no airway obstruction -No reversibility with bronchodilator , mod restriction Her husband smokes.. She has positive PPD.  CXR LLL nodular infx - resolved 04/2012  Home sleep test 07/2012 - showed desatn, but could not afford O2   Jan 2015 -admitted  asthma exacerbation, community-acquired pneumonia involving the right upper lobe and RSV bronchitis.  EGD in 03/2011 ([facility]) has shown peptic ulcer disease.  12/2013  barium esophagram - distal esophageal stricture.   stricture dilated ([place]) >> Restarted Singulair 04/2015   03/11/2017 Follow up: Asthma  Pt returns for 3 months  follow up . Says overall her breathing is doing okay . No flare of cough or wheezing . Remains on Symbicort . Rare use of ProAir .  Going to beach this weekend, very excited.   Has seen some more nasal drainage and watery eyes for last week. Has not started on zyrtec or flonase.   Had Barium swallow in Feb showed  Mild narrowing at the GE junction abover HH . Recommended to see GI but referral was not completed. She is followed by [facility], Dr. Henrene Pastor .  Denies n/v/d.    Allergies  Allergen Reactions  . Lodine [Etodolac] Anaphylaxis, Hives and Swelling  . Oxycontin [Oxycodone Hcl] Anaphylaxis    hives, trouble breathing, tongue swelling (Only Oxycontin) Tolerates plain oxycodone.  Marland Kitchen Penicillins Anaphylaxis    Told by a surgeon never to take it again. Has patient had a PCN reaction causing immediate rash,  facial/tongue/throat swelling, SOB or lightheadedness with hypotension: Yes Has patient had a PCN reaction causing severe rash involving mucus membranes or skin necrosis: Unknown Has patient had a PCN reaction that required hospitalization: No Has patient had a PCN reaction occurring within the last 10 years: No If all of the above answers are "NO", then may proceed with Cephalosporin use.  . Aspirin Other (See Comments)    High-dose caused GI Bleeds  . Darvocet [Propoxyphene N-Acetaminophen] Hives  . Nitroglycerin     IV-BP drops dramatically  . Ultram [Tramadol Hcl] Hives  . Valium Other (See Comments)    Circulation problems. "Legs turned black".    Immunization History  Administered Date(s) Administered  . Influenza Split 08/19/2011  . Influenza Whole 01/03/2013  . Influenza,inj,Quad PF,36+ Mos 08/20/2013, 09/15/2014, 08/26/2015, 08/01/2016  . Influenza-Unspecified 08/03/2016  . Pneumococcal Polysaccharide-23 08/19/2011  . Tdap 06/28/2014    Past Medical History:  Diagnosis Date  . Anemia    "chronic"  . Angina   . Anxiety and depression   . Atrial fibrillation ([place])    h/o "AF w/frequent PVCs"  . Chronic back pain greater than 3 months duration    on chronic narcotics, treated at pain clinic  . Colon polyps    hyperplastic  . Coronary artery disease    Arrythmia, orthostatic hypotension, HLD, HTN; sees Dr. Einar Gip  . Difficult intubation    "TMJ & woke up when they were  still cutting on me"  . Dysrhythmia    sees Dr. Einar Gip and a cardiologist at [facility]  . Esophageal stricture   . Fatty liver   . Fibroids   . Fibromyalgia    "in my legs"  . GERD (gastroesophageal reflux disease)    hx hiatal hernia, stricture and gastric ulcer  . Headache(784.0)   . Heart murmur   . Hiatal hernia   . History of loop recorder   . History of migraines    "dx'd when I was in my teens"  . Hyperlipemia   . Hypertension   . Mental disorder   . Mild episode of recurrent major  depressive disorder ([place]) 12/06/2015  . Myocardial infarction 1980's & 1990;   sees Dr. Einar Gip  . Pneumonia   . PONV (postoperative nausea and vomiting)   . Recurrent upper respiratory infection (URI)   . Shortness of breath 11/20/11   "all the time", sees pulmonlogy, ? asthma  . Stenotic cervical os   . Stomach ulcer    "3 small; found in 05/2011"  . TMJ (dislocation of temporomandibular joint)   . Tuberculosis    + TB SKIN TEST  . Type 2 diabetes mellitus without complication, without long-term current use of insulin ([place]) 12/06/2015    Tobacco History: History  Smoking Status  . Never Smoker  Smokeless Tobacco  . Never Used   Counseling given: Not Answered   Outpatient Encounter Prescriptions as of 03/11/2017  Medication Sig  . acetaminophen (TYLENOL) 500 MG tablet Take 1,000 mg by mouth every 4 (four) hours as needed for fever.  Marland Kitchen albuterol (PROVENTIL) (2.5 MG/3ML) 0.083% nebulizer solution Take 3 mLs (2.5 mg total) by nebulization every 6 (six) hours as needed for wheezing or shortness of breath (J45.40).  Marland Kitchen aspirin EC 81 MG tablet Take 81 mg by mouth daily.  Marland Kitchen atorvastatin (LIPITOR) 40 MG tablet TAKE 1 TABLET BY MOUTH EVERY DAY  . budesonide-formoterol (SYMBICORT) 80-4.5 MCG/ACT inhaler Take 2 puffs first thing in am and then another 2 puffs about 12 hours later.  . Calcium Carb-Cholecalciferol (CALCIUM 600+D) 600-800 MG-UNIT TABS Take 1 tablet by mouth daily.  . Cinnamon 500 MG capsule Take 1,000 mg by mouth 2 (two) times daily.   . clobetasol cream (TEMOVATE) 1.61 % Apply 1 application topically See admin instructions. Apply to affected area one to two times a week as needed for rash.  . Cyanocobalamin (VITAMIN B-12) 5000 MCG TBDP Take 5,000 mcg by mouth daily.   Marland Kitchen docusate sodium (COLACE) 100 MG capsule Take 1 capsule (100 mg total) by mouth 2 (two) times daily. (Patient taking differently: Take 100-200 mg by mouth daily as needed for mild constipation. )  . Eszopiclone  (ESZOPICLONE) 3 MG TABS Take 3 mg by mouth at bedtime as needed (insomnia).   . fludrocortisone (FLORINEF) 0.1 MG tablet TAKE 1 TABLET BY MOUTH TWICE A DAY (Patient taking differently: 1 tablet at bedtime)  . fluticasone (FLONASE) 50 MCG/ACT nasal spray Place 2 sprays into both nostrils daily as needed for allergies or rhinitis.   . hydrocortisone 2.5 % cream Apply 1 application topically 2 (two) times daily as needed. dermatitis  . isosorbide mononitrate (IMDUR) 60 MG 24 hr tablet TAKE 1 TABLET BY MOUTH EVERY DAY  . KLOR-CON M20 20 MEQ tablet TAKE 1 TABLET BY MOUTH EVERY DAY  . L-Methylfolate (DEPLIN) 7.5 MG TABS Take 7.5 mg by mouth daily with breakfast.   . LYRICA 75 MG capsule Take 75 mg  by mouth 3 (three) times daily.   . methocarbamol (ROBAXIN) 500 MG tablet Take 1 tablet (500 mg total) by mouth every 6 (six) hours as needed for muscle spasms.  . metoprolol (LOPRESSOR) 50 MG tablet TAKE 1 TABLET TWICE A DAY  . Misc Natural Products (GLUCOS-CHONDROIT-MSM COMPLEX PO) Take 2 tablets by mouth daily.  . montelukast (SINGULAIR) 10 MG tablet TAKE 1 TABLET (10 MG TOTAL) BY MOUTH AT BEDTIME.  . Multiple Vitamin (MULITIVITAMIN WITH MINERALS) TABS Take 1 tablet by mouth daily.    Marland Kitchen NEXIUM 40 MG capsule TAKE ONE CAPSULE BY MOUTH TWICE A DAY  . nitroGLYCERIN (NITROSTAT) 0.3 MG SL tablet Place 0.3 mg under the tongue every 5 (five) minutes as needed for chest pain (as needed).  . NONFORMULARY OR COMPOUNDED ITEM Estradiol .02% 1 ML Prefilled Applicator Sig: apply vaginally twice a week #90 Day Supply with 4 refills  . ondansetron (ZOFRAN) 4 MG tablet Take 4 mg by mouth every 4 (four) hours as needed for nausea or vomiting.  Marland Kitchen oxycodone (ROXICODONE) 30 MG immediate release tablet Take 30 mg by mouth every 4 (four) hours as needed for pain.  . OXYGEN Inhale 2 L into the lungs at bedtime.  . polyethylene glycol (MIRALAX / GLYCOLAX) packet Take 17 g by mouth 2 (two) times daily. (Patient taking differently:  Take 17 g by mouth daily as needed for mild constipation. )  . PROAIR HFA 108 (90 Base) MCG/ACT inhaler INHALE 2 PUFFS INTO THE LUNGS EVERY 4 (FOUR) HOURS AS NEEDED. SHORTNESS OF BREATH  . progesterone (PROMETRIUM) 200 MG capsule TAKE 1 CAPSULE BY MOUTH EVERY DAY FOR FIRST 12 DAYS OF MONTH  . promethazine (PHENERGAN) 25 MG tablet TAKE 1 TABLET BY MOUTH AT BEDTIME AS NEEDED FOR NAUSEA AND VOMITING  . sucralfate (CARAFATE) 1 GM/10ML suspension Take 10 mLs (1 g total) by mouth 4 (four) times daily -  with meals and at bedtime. Reported on 01/05/2016  . [DISCONTINUED] aspirin 81 MG chewable tablet Chew 1 tablet (81 mg total) by mouth 2 (two) times daily. Take for 4 weeks, then resume regular dose.  . [DISCONTINUED] OXYCODONE HCL PO Take 30 mg by mouth every 4 (four) hours as needed.    No facility-administered encounter medications on file as of 03/11/2017.      Review of Systems  Constitutional:   No  weight loss, night sweats,  Fevers, chills, fatigue, or  lassitude.  HEENT:   No headaches,  Difficulty swallowing,  Tooth/dental problems, or  Sore throat,                No sneezing, itching, ear ache, nasal congestion, post nasal drip,   CV:  No chest pain,  Orthopnea, PND, swelling in lower extremities, anasarca, dizziness, palpitations, syncope.   GI  No heartburn, indigestion, abdominal pain, nausea, vomiting, diarrhea, change in bowel habits, loss of appetite, bloody stools.   Resp: No shortness of breath with exertion or at rest.  No excess mucus, no productive cough,  No non-productive cough,  No coughing up of blood.  No change in color of mucus.  No wheezing.  No chest wall deformity  Skin: no rash or lesions.  GU: no dysuria, change in color of urine, no urgency or frequency.  No flank pain, no hematuria   MS:  No joint pain or swelling.  No decreased range of motion.  No back pain.    Physical Exam  BP 118/80 (BP Location: Right Arm, Cuff Size: Normal)  Pulse 66   Ht 5\' 6"   (1.676 m)   Wt 225 lb 6.4 oz (102.2 kg)   SpO2 97%   BMI 36.38 kg/m   GEN: A/Ox3; pleasant , NAD obese    HEENT:  [place]/AT,  EACs-clear, TMs-wnl, NOSE-clear, THROAT-clear, no lesions, no postnasal drip or exudate noted. Poor dentition   NECK:  Supple w/ fair ROM; no JVD; normal carotid impulses w/o bruits; no thyromegaly or nodules palpated; no lymphadenopathy.    RESP  Clear  P & A; w/o, wheezes/ rales/ or rhonchi. no accessory muscle use, no dullness to percussion  CARD:  RRR, no m/r/g, no peripheral edema, pulses intact, no cyanosis or clubbing.  GI:   Soft & nt; nml bowel sounds; no organomegaly or masses detected.   Musco: Warm bil, no deformities or joint swelling noted.   Neuro: alert, no focal deficits noted.    Skin: Warm, no lesions or rashes    Lab Results:  CBC  BNP No results found for: BNP  ProBNP   Imaging: No results found.   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  Referral to GI as discussed.  Follow up Dr. Elsworth Soho  In 3 months and As needed       GERD (gastroesophageal reflux disease) Mild narrowing at GE junction on Barium swallow  Refer to GI .   Allergic rhinitis Use Zyrtec and flonase As needed       Rexene Edison, NP 03/11/2017

## 2017-03-11 NOTE — Assessment & Plan Note (Signed)
Doing well on current regimen  Control for triggers   Plan Patient Instructions  May use Zyrtec 10mg  At bedtime   Saline nasal rinses As needed   Flonase 2 puffs daily As needed  Nasal congestion  Continue on Symbicort 2 puffs Twice daily  , rinse after use.  Referral to GI as discussed.  Follow up Dr. Elsworth Soho  In 3 months and As needed

## 2017-03-11 NOTE — Assessment & Plan Note (Signed)
Use Zyrtec and flonase As needed

## 2017-03-11 NOTE — Patient Instructions (Addendum)
May use Zyrtec 10mg  At bedtime   Saline nasal rinses As needed   Flonase 2 puffs daily As needed  Nasal congestion  Continue on Symbicort 2 puffs Twice daily  , rinse after use.  Referral to GI as discussed.  Follow up Dr. Elsworth Soho  In 3 months and As needed

## 2017-03-11 NOTE — Assessment & Plan Note (Signed)
Mild narrowing at GE junction on Barium swallow  Refer to GI .

## 2017-03-12 NOTE — Progress Notes (Signed)
She had pulmonary hypertension noted on echo with cardiology Einar Gip) and repeat echo is planned in 6 months

## 2017-03-13 DIAGNOSIS — M179 Osteoarthritis of knee, unspecified: Secondary | ICD-10-CM | POA: Diagnosis not present

## 2017-03-13 DIAGNOSIS — G894 Chronic pain syndrome: Secondary | ICD-10-CM | POA: Diagnosis not present

## 2017-03-13 DIAGNOSIS — Z79891 Long term (current) use of opiate analgesic: Secondary | ICD-10-CM | POA: Diagnosis not present

## 2017-03-13 DIAGNOSIS — M47816 Spondylosis without myelopathy or radiculopathy, lumbar region: Secondary | ICD-10-CM | POA: Diagnosis not present

## 2017-03-14 ENCOUNTER — Other Ambulatory Visit: Payer: Self-pay | Admitting: Family Medicine

## 2017-03-23 DIAGNOSIS — J45909 Unspecified asthma, uncomplicated: Secondary | ICD-10-CM | POA: Diagnosis not present

## 2017-03-31 ENCOUNTER — Other Ambulatory Visit: Payer: Self-pay | Admitting: Gynecology

## 2017-04-07 ENCOUNTER — Other Ambulatory Visit: Payer: Self-pay | Admitting: Family Medicine

## 2017-04-11 DIAGNOSIS — M47816 Spondylosis without myelopathy or radiculopathy, lumbar region: Secondary | ICD-10-CM | POA: Diagnosis not present

## 2017-04-11 DIAGNOSIS — G894 Chronic pain syndrome: Secondary | ICD-10-CM | POA: Diagnosis not present

## 2017-04-11 DIAGNOSIS — M179 Osteoarthritis of knee, unspecified: Secondary | ICD-10-CM | POA: Diagnosis not present

## 2017-04-11 DIAGNOSIS — Z79891 Long term (current) use of opiate analgesic: Secondary | ICD-10-CM | POA: Diagnosis not present

## 2017-04-15 DIAGNOSIS — Z95818 Presence of other cardiac implants and grafts: Secondary | ICD-10-CM | POA: Diagnosis not present

## 2017-04-15 DIAGNOSIS — R55 Syncope and collapse: Secondary | ICD-10-CM | POA: Diagnosis not present

## 2017-04-15 DIAGNOSIS — Z4509 Encounter for adjustment and management of other cardiac device: Secondary | ICD-10-CM | POA: Diagnosis not present

## 2017-04-15 DIAGNOSIS — Z95811 Presence of heart assist device: Secondary | ICD-10-CM | POA: Diagnosis not present

## 2017-04-17 ENCOUNTER — Encounter: Payer: Self-pay | Admitting: Gynecology

## 2017-04-22 DIAGNOSIS — J45909 Unspecified asthma, uncomplicated: Secondary | ICD-10-CM | POA: Diagnosis not present

## 2017-05-10 ENCOUNTER — Encounter: Payer: Self-pay | Admitting: Nurse Practitioner

## 2017-05-10 ENCOUNTER — Ambulatory Visit (INDEPENDENT_AMBULATORY_CARE_PROVIDER_SITE_OTHER): Payer: Medicare Other | Admitting: Nurse Practitioner

## 2017-05-10 VITALS — BP 110/80 | HR 72 | Ht 65.0 in | Wt 225.2 lb

## 2017-05-10 DIAGNOSIS — K222 Esophageal obstruction: Secondary | ICD-10-CM | POA: Diagnosis not present

## 2017-05-10 DIAGNOSIS — R1310 Dysphagia, unspecified: Secondary | ICD-10-CM

## 2017-05-10 DIAGNOSIS — K219 Gastro-esophageal reflux disease without esophagitis: Secondary | ICD-10-CM | POA: Diagnosis not present

## 2017-05-10 NOTE — Patient Instructions (Signed)
As discussed with Shelby Anderson, look on [place] to find a wedge pillow.  We will call you after Shelby Anderson has talked with Dr. Henrene Pastor

## 2017-05-10 NOTE — Progress Notes (Signed)
HPI: Patient is a 61 year old female known to Dr. Henrene Pastor for history of chronic GERD and dysphagia. EGD April 2015 revealed a distal esophageal stricture. Dilation was performed with a 54 [place] Maloney dilator. Post dilation patient swallowed better for the next several months. She came back in February 2017 with uncontrolled GERD symptoms and dysphagia to liquids which was felt to be more related to dysmotility.    Patient is referred back by Rexene Edison,  NP for evaluation of abnormal barium swallow done in Feb with findings of a persistent/recurrent stricture at the GE junction. Chopped meats and bread tend to get stuck. Carbonated drinks cause her the most problem but unfortunately she needs them to take her daily meds. She has reflux / regurgitation, especially at night. On Nexium BID, sleeps propped on pillows and takes Carafate TID. Hasn't been able to lose weight, it is up 6 pounds since last visit.   Last colonoscopy was in 2012 with [facility]. Hyperplastic polyps removed.   Past Medical History:  Diagnosis Date  . Anemia    "chronic"  . Angina   . Anxiety and depression   . Atrial fibrillation ([place])    h/o "AF w/frequent PVCs"  . Chronic back pain greater than 3 months duration    on chronic narcotics, treated at pain clinic  . Colon polyps    hyperplastic  . Coronary artery disease    Arrythmia, orthostatic hypotension, HLD, HTN; sees Dr. Einar Gip  . Difficult intubation    "TMJ & woke up when they were still cutting on me"  . Dysrhythmia    sees Dr. Einar Gip and a cardiologist at [facility]  . Esophageal stricture   . Fatty liver   . Fibroids   . Fibromyalgia    "in my legs"  . GERD (gastroesophageal reflux disease)    hx hiatal hernia, stricture and gastric ulcer  . Headache(784.0)   . Heart murmur   . Hiatal hernia   . History of loop recorder   . History of migraines    "dx'd when I was in my teens"  . Hyperlipemia   . Hypertension   . Mental disorder   . Mild  episode of recurrent major depressive disorder ([place]) 12/06/2015  . Myocardial infarction [facility]) 1980's & 1990;   sees Dr. Einar Gip  . Pneumonia   . PONV (postoperative nausea and vomiting)   . Recurrent upper respiratory infection (URI)   . Shortness of breath 11/20/11   "all the time", sees pulmonlogy, ? asthma  . Stenotic cervical os   . Stomach ulcer    "3 small; found in 05/2011"  . TMJ (dislocation of temporomandibular joint)   . Tuberculosis    + TB SKIN TEST  . Type 2 diabetes mellitus without complication, without long-term current use of insulin ([place]) 12/06/2015    Patient's surgical history, family medical history, social history, medications and allergies were all reviewed in Epic    Physical Exam: BP 110/80   Pulse 72   Ht 5\' 5"  (1.651 m)   Wt 225 lb 4 oz (102.2 kg)   BMI 37.48 kg/m   GENERAL: overweight female in NAD PSYCH: :Pleasant, cooperative, normal affect EENT:  conjunctiva pink, mucous membranes moist, neck supple without masses CARDIAC:  RRR, no murmur heard, no peripheral edema PULM: Normal respiratory effort, lungs CTA bilaterally, no wheezing ABDOMEN:  soft, nontender, nondistended, no obvious masses, no hepatomegaly,  normal bowel sounds SKIN:  turgor, no lesions seen Musculoskeletal:  Normal  muscle tone, normal strength NEURO: Alert and oriented x 3, no focal neurologic deficits    ASSESSMENT and PLAN:   Pleasant 61 year old female with chronic GERD / chronic solid food and liquid dysphagia.  Suspect a component of esophageal dysmotility though barium swallow does show a persistent / recurrent stricture at GEJ. Similar findings on barium swallow in 2015 for which patient had EGD with maloney dilation  giving her a 6 month reprieve in symptoms.  - I will discuss case with patient's primary GI, Dr. Henrene Pastor. If he feels repeat dilation will be helpful then we will call patient to arrange. Advised patient in the interim to eat small bites, chew well with liquids  in between bites to avoid food impaction. -recommended wedge pillos -continue anti-reflux measures.  -continue BID PPI  Tye Savoy , NP 05/10/2017, 2:23 PM

## 2017-05-14 NOTE — Progress Notes (Signed)
Based on abnormal esophagram, reasonable to set up EGD with dilation in [place]. Make sure that she does not use oxygen therapy. Thanks (saw it on her list but not mentioned during pulmonary visit)

## 2017-05-20 ENCOUNTER — Ambulatory Visit (INDEPENDENT_AMBULATORY_CARE_PROVIDER_SITE_OTHER): Payer: Medicare Other | Admitting: Family Medicine

## 2017-05-20 ENCOUNTER — Encounter: Payer: Self-pay | Admitting: Family Medicine

## 2017-05-20 ENCOUNTER — Other Ambulatory Visit: Payer: Self-pay | Admitting: Internal Medicine

## 2017-05-20 DIAGNOSIS — E1159 Type 2 diabetes mellitus with other circulatory complications: Secondary | ICD-10-CM

## 2017-05-20 DIAGNOSIS — E1169 Type 2 diabetes mellitus with other specified complication: Secondary | ICD-10-CM

## 2017-05-20 DIAGNOSIS — I10 Essential (primary) hypertension: Secondary | ICD-10-CM | POA: Diagnosis not present

## 2017-05-20 DIAGNOSIS — E119 Type 2 diabetes mellitus without complications: Secondary | ICD-10-CM

## 2017-05-20 DIAGNOSIS — B351 Tinea unguium: Secondary | ICD-10-CM

## 2017-05-20 DIAGNOSIS — E785 Hyperlipidemia, unspecified: Secondary | ICD-10-CM | POA: Diagnosis not present

## 2017-05-20 DIAGNOSIS — I420 Dilated cardiomyopathy: Secondary | ICD-10-CM | POA: Diagnosis not present

## 2017-05-20 NOTE — Progress Notes (Signed)
HPI: AWV 11/08/16  Shelby Anderson is a pleasant 61 y.o. here for follow up. Chronic medical problems summarized below were reviewed for changes.These issues and their treatment remain stable for the most part. Doing "great." No depression. Reports walking on a regular basis and getting grandkids to go with her. Depressed mood seems to have resolved.Diet is not so good - trying to do better. Still drinks soda daily. Denies CP, SOB, DOE, treatment intolerance or new symptoms. Due for labs, foot exam, urine microalb  ROS: See pertinent positives and negatives per HPI.  Past Medical History:  Diagnosis Date  . Anemia    "chronic"  . Angina   . Anxiety and depression   . Atrial fibrillation ([place])    h/o "AF w/frequent PVCs"  . Chronic back pain greater than 3 months duration    on chronic narcotics, treated at pain clinic  . Colon polyps    hyperplastic  . Coronary artery disease    Arrythmia, orthostatic hypotension, HLD, HTN; sees Dr. Einar Gip  . Difficult intubation    "TMJ & woke up when they were still cutting on me"  . Dysrhythmia    sees Dr. Einar Gip and a cardiologist at [facility]  . Esophageal stricture   . Fatty liver   . Fibroids   . Fibromyalgia    "in my legs"  . GERD (gastroesophageal reflux disease)    hx hiatal hernia, stricture and gastric ulcer  . Headache(784.0)   . Heart murmur   . Hiatal hernia   . History of loop recorder   . History of migraines    "dx'd when I was in my teens"  . Hyperlipemia   . Hypertension   . Mental disorder   . Mild episode of recurrent major depressive disorder ([place]) 12/06/2015  . Myocardial infarction [facility]) 1980's & 1990;   sees Dr. Einar Gip  . Pneumonia   . PONV (postoperative nausea and vomiting)   . Recurrent upper respiratory infection (URI)   . Shortness of breath 11/20/11   "all the time", sees pulmonlogy, ? asthma  . Stenotic cervical os   . Stomach ulcer    "3 small; found in 05/2011"  . TMJ (dislocation of  temporomandibular joint)   . Tuberculosis    + TB SKIN TEST  . Type 2 diabetes mellitus without complication, without long-term current use of insulin ([place]) 12/06/2015    Past Surgical History:  Procedure Laterality Date  . ACHILLES TENDON REPAIR  1970's   left ankle  . ARTHROSCOPIC REPAIR ACL     left knee cap  . CARDIAC CATHETERIZATION     loop recorder  . CARPAL TUNNEL RELEASE  unknown   left hand  . LOOP RECORDER IMPLANT    . post ganglionectomy  1970's   "for migraine headaches"  . pouch string  [phone]   "did this 3 times (once w/each pregnancy)"  . TOTAL KNEE ARTHROPLASTY Left 09/25/2016   Procedure: LEFT TOTAL KNEE ARTHROPLASTY;  Surgeon: Paralee Cancel, MD;  Location: [facility];  Service: Orthopedics;  Laterality: Left;  . TUBAL LIGATION  1980's    Family History  Problem Relation Age of Onset  . Malignant hyperthermia Father   . Hypertension Father   . Heart disease Father   . Diabetes Father   . Cancer Father        skin  . Hypertension Mother   . Heart disease Mother   . Cancer Sister        CERVICAL  .  Hypertension Sister   . Cancer Brother        MELANOMA  . Heart disease Maternal Grandmother   . Heart disease Maternal Grandfather   . Cancer Paternal Grandmother        ?   Marland Kitchen Heart disease Paternal Grandmother   . Heart disease Paternal Grandfather   . Cancer Brother        LUNG  . Diabetes Sister   . Hypertension Sister   . Heart disease Sister   . Cancer Sister   . Cancer Brother   . Anesthesia problems Neg Hx   . Hypotension Neg Hx   . Pseudochol deficiency Neg Hx     Social History   Social History  . Marital status: Married    Spouse name: N/A  . Number of children: 3  . Years of education: N/A   Occupational History  . Retired Therapist, sports    Social History Main Topics  . Smoking status: Never Smoker  . Smokeless tobacco: Never Used  . Alcohol use No  . Drug use: No  . Sexual activity: Yes    Birth control/ protection: Surgical   Other  Topics Concern  . None   Social History Narrative  . None     Current Outpatient Prescriptions:  .  acetaminophen (TYLENOL) 500 MG tablet, Take 1,000 mg by mouth every 4 (four) hours as needed for fever., Disp: , Rfl:  .  albuterol (PROVENTIL) (2.5 MG/3ML) 0.083% nebulizer solution, Take 3 mLs (2.5 mg total) by nebulization every 6 (six) hours as needed for wheezing or shortness of breath (J45.40)., Disp: 75 mL, Rfl: 3 .  aspirin EC 81 MG tablet, Take 81 mg by mouth daily., Disp: , Rfl:  .  atorvastatin (LIPITOR) 40 MG tablet, TAKE 1 TABLET BY MOUTH EVERY DAY, Disp: 90 tablet, Rfl: 1 .  budesonide-formoterol (SYMBICORT) 80-4.5 MCG/ACT inhaler, Take 2 puffs first thing in am and then another 2 puffs about 12 hours later., Disp: 1 Inhaler, Rfl: 11 .  Calcium Carb-Cholecalciferol (CALCIUM 600+D) 600-800 MG-UNIT TABS, Take 1 tablet by mouth daily., Disp: , Rfl:  .  Cinnamon 500 MG capsule, Take 1,000 mg by mouth 2 (two) times daily. , Disp: , Rfl:  .  clobetasol cream (TEMOVATE) 7.98 %, Apply 1 application topically See admin instructions. Apply to affected area one to two times a week as needed for rash., Disp: , Rfl:  .  Cyanocobalamin (VITAMIN B-12) 5000 MCG TBDP, Take 5,000 mcg by mouth daily. , Disp: , Rfl:  .  docusate sodium (COLACE) 100 MG capsule, Take 1 capsule (100 mg total) by mouth 2 (two) times daily. (Patient taking differently: Take 100-200 mg by mouth daily as needed for mild constipation. ), Disp: 10 capsule, Rfl: 0 .  Eszopiclone (ESZOPICLONE) 3 MG TABS, Take 3 mg by mouth at bedtime as needed (insomnia). , Disp: , Rfl:  .  fludrocortisone (FLORINEF) 0.1 MG tablet, TAKE 1 TABLET BY MOUTH TWICE A DAY (Patient taking differently: 1 tablet at bedtime), Disp: 60 tablet, Rfl: 5 .  fluticasone (FLONASE) 50 MCG/ACT nasal spray, Place 2 sprays into both nostrils daily as needed for allergies or rhinitis. , Disp: , Rfl:  .  hydrocortisone 2.5 % cream, Apply 1 application topically 2 (two)  times daily as needed. dermatitis, Disp: , Rfl:  .  isosorbide mononitrate (IMDUR) 60 MG 24 hr tablet, TAKE 1 TABLET BY MOUTH EVERY DAY, Disp: 90 tablet, Rfl: 1 .  KLOR-CON M20 20 MEQ tablet,  TAKE 1 TABLET BY MOUTH EVERY DAY, Disp: 30 tablet, Rfl: 3 .  L-Methylfolate (DEPLIN) 7.5 MG TABS, Take 7.5 mg by mouth daily with breakfast. , Disp: , Rfl:  .  LYRICA 75 MG capsule, Take 75 mg by mouth 3 (three) times daily. , Disp: , Rfl:  .  methocarbamol (ROBAXIN) 500 MG tablet, Take 1 tablet (500 mg total) by mouth every 6 (six) hours as needed for muscle spasms., Disp: 60 tablet, Rfl: 0 .  metoprolol (LOPRESSOR) 50 MG tablet, TAKE 1 TABLET TWICE A DAY, Disp: 180 tablet, Rfl: 1 .  Misc Natural Products (GLUCOS-CHONDROIT-MSM COMPLEX PO), Take 2 tablets by mouth daily., Disp: , Rfl:  .  montelukast (SINGULAIR) 10 MG tablet, TAKE 1 TABLET (10 MG TOTAL) BY MOUTH AT BEDTIME., Disp: 90 tablet, Rfl: 3 .  Multiple Vitamin (MULITIVITAMIN WITH MINERALS) TABS, Take 1 tablet by mouth daily.  , Disp: , Rfl:  .  NEXIUM 40 MG capsule, TAKE ONE CAPSULE BY MOUTH TWICE A DAY, Disp: 60 capsule, Rfl: 6 .  nitroGLYCERIN (NITROSTAT) 0.3 MG SL tablet, Place 0.3 mg under the tongue every 5 (five) minutes as needed for chest pain (as needed)., Disp: , Rfl:  .  NONFORMULARY OR COMPOUNDED ITEM, Estradiol .02% 1 ML Prefilled Applicator Sig: apply vaginally twice a week #90 Day Supply with 4 refills, Disp: 1 each, Rfl: 4 .  oxycodone (ROXICODONE) 30 MG immediate release tablet, Take 30 mg by mouth every 4 (four) hours as needed for pain., Disp: , Rfl:  .  OXYGEN, Inhale 2 L into the lungs at bedtime., Disp: , Rfl:  .  polyethylene glycol (MIRALAX / GLYCOLAX) packet, Take 17 g by mouth 2 (two) times daily. (Patient taking differently: Take 17 g by mouth daily as needed for mild constipation. ), Disp: 14 each, Rfl: 0 .  PROAIR HFA 108 (90 Base) MCG/ACT inhaler, INHALE 2 PUFFS INTO THE LUNGS EVERY 4 (FOUR) HOURS AS NEEDED. SHORTNESS OF  BREATH, Disp: 8.5 Inhaler, Rfl: 3 .  progesterone (PROMETRIUM) 200 MG capsule, TAKE 1 CAPSULE BY MOUTH EVERY DAY FOR FIRST 12 DAYS OF MONTH, Disp: 36 capsule, Rfl: 2 .  promethazine (PHENERGAN) 25 MG tablet, TAKE 1 TABLET BY MOUTH AT BEDTIME AS NEEDED FOR NAUSEA AND VOMITING, Disp: 30 tablet, Rfl: 1 .  sucralfate (CARAFATE) 1 GM/10ML suspension, Take 10 mLs (1 g total) by mouth 4 (four) times daily -  with meals and at bedtime. Reported on 01/05/2016, Disp: 420 mL, Rfl: 6 .  ondansetron (ZOFRAN) 4 MG tablet, Take 4 mg by mouth every 4 (four) hours as needed for nausea or vomiting., Disp: , Rfl:   EXAM:  Vitals:   05/20/17 1554  BP: 110/66  Temp: 99.2 F (37.3 C)    Body mass index is 37.28 kg/m.  GENERAL: vitals reviewed and listed above, alert, oriented, appears well hydrated and in no acute distress  HEENT: atraumatic, conjunttiva clear, no obvious abnormalities on inspection of external nose and ears  NECK: no obvious masses on inspection  LUNGS: clear to auscultation bilaterally, no wheezes, rales or rhonchi, good air movement  CV: HRRR, no peripheral edema  MS: moves all extremities without noticeable abnormality  PSYCH: pleasant and cooperative, no obvious depression or anxiety  FOOT exam: see chart   ASSESSMENT AND PLAN:  Discussed the following assessment and plan:  Morbid obesity due to excess calories ([place])  Type 2 diabetes mellitus without complication, without long-term current use of insulin ([place]) - Plan: Microalbumin /  creatinine urine ratio  Hypertension associated with diabetes ([place]) - Plan: Basic metabolic panel, CBC  Nail fungus  Dilated cardiomyopathy ([place])   -lifestyle recs - low sugar healthy diet, regular exercise - congratulate don changes and encouraged further changes -labs -nail fungus - she opted to try apple cider vinegar for this -follow up 3 months -Patient advised to return or notify a doctor immediately if symptoms worsen or persist  or new concerns arise.  Patient Instructions  BEFORE YOU LEAVE: -follow up: 3 months -labs  We have ordered labs or studies at this visit. It can take up to 1-2 weeks for results and processing. IF results require follow up or explanation, we will call you with instructions. Clinically stable results will be released to your [facility]. If you have not heard from [place] or cannot find your results in [facility] in 2 weeks please contact our office at [phone].  If you are not yet signed up for [facility], please consider signing up.  Advise regular aerobic exercise (at least 150 minutes per week of sweaty exercise) and a healthy diet. Try to eat at least 5-9 servings of vegetables and fruits per day (not corn, potatoes or bananas.) Avoid sweets, red meat, pork, butter, fried foods, fast food, processed food, excessive dairy, eggs and coconut. Replace bad fats with good fats - fish, nuts and seeds, canola oil, olive oil.           Colin Benton R., DO

## 2017-05-20 NOTE — Patient Instructions (Signed)
BEFORE YOU LEAVE: -follow up: 3 months -labs  We have ordered labs or studies at this visit. It can take up to 1-2 weeks for results and processing. IF results require follow up or explanation, we will call you with instructions. Clinically stable results will be released to your [facility]. If you have not heard from [place] or cannot find your results in [facility] in 2 weeks please contact our office at [phone].  If you are not yet signed up for [facility], please consider signing up.  Advise regular aerobic exercise (at least 150 minutes per week of sweaty exercise) and a healthy diet. Try to eat at least 5-9 servings of vegetables and fruits per day (not corn, potatoes or bananas.) Avoid sweets, red meat, pork, butter, fried foods, fast food, processed food, excessive dairy, eggs and coconut. Replace bad fats with good fats - fish, nuts and seeds, canola oil, olive oil.

## 2017-05-21 LAB — BASIC METABOLIC PANEL
BUN: 10 mg/dL (ref 6–23)
CALCIUM: 9.4 mg/dL (ref 8.4–10.5)
CO2: 34 mEq/L — ABNORMAL HIGH (ref 19–32)
CREATININE: 0.58 mg/dL (ref 0.40–1.20)
Chloride: 103 mEq/L (ref 96–112)
GFR: 113.02 mL/min (ref >60.00)
GLUCOSE: 95 mg/dL (ref 70–99)
POTASSIUM: 4 meq/L (ref 3.5–5.1)
Sodium: 143 mEq/L (ref 135–145)

## 2017-05-21 LAB — CBC
HCT: 40.3 % (ref 36.0–46.0)
HEMOGLOBIN: 13.3 g/dL (ref 12.0–15.0)
MCHC: 32.9 g/dL (ref 30.0–36.0)
MCV: 86.4 fl (ref 78.0–100.0)
Platelets: 267 10*3/uL (ref 150.0–400.0)
RBC: 4.67 Mil/uL (ref 3.87–5.11)
RDW: 14.7 % (ref 11.5–15.5)
WBC: 7.3 10*3/uL (ref 4.0–10.5)

## 2017-05-21 LAB — MICROALBUMIN / CREATININE URINE RATIO
Creatinine,U: 261.4 mg/dL
MICROALB UR: 2.1 mg/dL — AB (ref 0.0–1.9)
Microalb Creat Ratio: 0.8 mg/g (ref 0.0–30.0)

## 2017-05-23 DIAGNOSIS — J45909 Unspecified asthma, uncomplicated: Secondary | ICD-10-CM | POA: Diagnosis not present

## 2017-06-07 DIAGNOSIS — Z79891 Long term (current) use of opiate analgesic: Secondary | ICD-10-CM | POA: Diagnosis not present

## 2017-06-07 DIAGNOSIS — M47816 Spondylosis without myelopathy or radiculopathy, lumbar region: Secondary | ICD-10-CM | POA: Diagnosis not present

## 2017-06-07 DIAGNOSIS — M179 Osteoarthritis of knee, unspecified: Secondary | ICD-10-CM | POA: Diagnosis not present

## 2017-06-07 DIAGNOSIS — S9422XS Injury of deep peroneal nerve at ankle and foot level, left leg, sequela: Secondary | ICD-10-CM | POA: Diagnosis not present

## 2017-06-07 DIAGNOSIS — G894 Chronic pain syndrome: Secondary | ICD-10-CM | POA: Diagnosis not present

## 2017-06-13 ENCOUNTER — Encounter: Payer: Medicare Other | Admitting: Gynecology

## 2017-06-22 DIAGNOSIS — J45909 Unspecified asthma, uncomplicated: Secondary | ICD-10-CM | POA: Diagnosis not present

## 2017-06-24 ENCOUNTER — Other Ambulatory Visit: Payer: Self-pay | Admitting: Internal Medicine

## 2017-06-25 ENCOUNTER — Encounter: Payer: Medicare Other | Admitting: Gynecology

## 2017-07-01 ENCOUNTER — Ambulatory Visit (INDEPENDENT_AMBULATORY_CARE_PROVIDER_SITE_OTHER): Payer: Medicare Other | Admitting: Pulmonary Disease

## 2017-07-01 ENCOUNTER — Encounter: Payer: Self-pay | Admitting: Pulmonary Disease

## 2017-07-01 DIAGNOSIS — K222 Esophageal obstruction: Secondary | ICD-10-CM | POA: Insufficient documentation

## 2017-07-01 DIAGNOSIS — J9611 Chronic respiratory failure with hypoxia: Secondary | ICD-10-CM | POA: Diagnosis not present

## 2017-07-01 DIAGNOSIS — J45991 Cough variant asthma: Secondary | ICD-10-CM | POA: Diagnosis not present

## 2017-07-01 NOTE — Patient Instructions (Signed)
Await results of your repeat echo for pulmonary hypertension  I will asked Dr. Henrene Pastor to discuss with you about dilatation procedure

## 2017-07-01 NOTE — Assessment & Plan Note (Signed)
Ct noct O2 

## 2017-07-01 NOTE — Assessment & Plan Note (Signed)
I will asked Dr. Henrene Pastor to discuss with you about dilatation procedure

## 2017-07-01 NOTE — Progress Notes (Signed)
   Subjective:    Patient ID: Shelby Anderson, female    DOB: June 05, 1956, 61 y.o.   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  She was evaluated for CAD at [facility] for syncope &has a loop recorder - brady &int a - fibn Her husband smokes. She has positive PPD.  Chief Complaint  Patient presents with  . Follow-up    Pt states that she is still having episodes where she is gasping for air; pt states that she has been worse x2 weeks and is even hoarse sounding.   She had pulmonary hypertension noted on echo 01/2017 with cardiology Einar Gip) and repeat echo is planned in August  She is compliant with nocturnal oxygen She remains on Symbicort and as needed albuterol, denies flareups requiring hospitalization or ER visits or intermittent wheezing, no nocturnal symptoms. She continues to have choking and gasping episodes during sleep however I do note that home sleep study was negative in 2013   She had distal esophageal stricture in 12/2013 which was dilated, stricture was again demonstrated on esophagram in 2018, she was seen by GI  Her main complaint is a feeling of lightheadedness when she suddenly bends down and looks up-medication review shows metoprolol, she has been taken off Florinef now    Significant tests/ events  Spirometry 12/2011 - no airway obstruction -No reversibility with bronchodilator , mod restriction  Home sleep test 07/2012 - showed desatn,no OSA  12/2013 -admitted asthma exacerbation,CAP, RSV bronchitis.  EGD 03/2011 ([facility]) - peptic ulcer disease.  12/2013 barium esophagram - distal esophageal stricture.  stricture dilated ([place])  11/2015 ONO positive , Started on oxygen 2l/m .  10/2016 CT angio neg PE  CT sinus 01/2017 >> clear 01/2017  Swallow >> Persistent/recurrent stricture at the GE junction      Review of Systems neg for any significant sore throat, dysphagia, itching, sneezing, nasal congestion or excess/ purulent secretions, fever, chills, sweats, unintended wt loss, pleuritic or exertional cp, hempoptysis, orthopnea pnd or change in chronic leg swelling. Also denies presyncope, palpitations, heartburn, abdominal pain, nausea, vomiting, diarrhea or change in bowel or urinary habits, dysuria,hematuria, rash, arthralgias, visual complaints, headache, numbness weakness or ataxia.     Objective:   Physical Exam   Gen. Pleasant, obese, in no distress ENT - dentures, no post nasal drip Neck: No JVD, no thyromegaly, no carotid bruits Lungs: no use of accessory muscles, no dullness to percussion, decreased without rales or rhonchi  Cardiovascular: Rhythm regular, heart sounds  normal, no murmurs or gallops, no peripheral edema Musculoskeletal: No deformities, no cyanosis or clubbing , no tremors        Assessment & Plan:

## 2017-07-01 NOTE — Assessment & Plan Note (Signed)
Continue Symbicort with as needed albuterol  Await results of your repeat echo for pulmonary hypertension

## 2017-07-03 ENCOUNTER — Other Ambulatory Visit: Payer: Self-pay | Admitting: Family Medicine

## 2017-07-10 ENCOUNTER — Other Ambulatory Visit: Payer: Self-pay | Admitting: Family Medicine

## 2017-07-11 ENCOUNTER — Other Ambulatory Visit: Payer: Self-pay | Admitting: Adult Health

## 2017-07-16 DIAGNOSIS — I2781 Cor pulmonale (chronic): Secondary | ICD-10-CM | POA: Diagnosis not present

## 2017-07-16 DIAGNOSIS — I10 Essential (primary) hypertension: Secondary | ICD-10-CM | POA: Diagnosis not present

## 2017-07-19 ENCOUNTER — Telehealth: Payer: Self-pay

## 2017-07-19 NOTE — Telephone Encounter (Signed)
-----   Message from Willia Craze, NP sent at 07/03/2017  9:20 AM EDT ----- Regarding: FW: EGD   ----- Message ----- From: Irene Shipper, MD Sent: 07/02/2017   1:46 PM To: Willia Craze, NP, Steva Ready, RN Subject: RE: EGD                                        I don't. Fortunately, this is not an urgent issue. Thanks ----- Message ----- From: Willia Craze, NP Sent: 07/02/2017  12:03 PM To: Irene Shipper, MD Subject: EGD                                            Beth looked at your schedule. Your next dedicated hospital day is in October. Do you have any secret spots available? Thanks ----- Message ----- From: Greggory Keen, LPN Sent: 4/48/1856  11:24 AM To: Willia Craze, NP  That puts the case to 09/03/17. I will arrange this and contact the patient. Okay to proceed? ----- Message ----- From: Irene Shipper, MD Sent: 07/02/2017   9:56 AM To: Greggory Keen, LPN  Next half days session dedicated to [facility] outpatient procedures. Not sure when that is. She must have MAC/propofol ----- Message ----- From: Greggory Keen, LPN Sent: 02/13/9701   9:46 AM To: Irene Shipper, MD  Dr Henrene Pastor, Please advise on scheduling this patient on your hospital schedule.  Thank you Beth ----- Message ----- From: Willia Craze, NP Sent: 07/02/2017   9:26 AM To: Greggory Keen, LPN  Beth, will you arrange for patient to have EGD with dilation at the hospital with Dr. Henrene Pastor. Thanks

## 2017-07-19 NOTE — Telephone Encounter (Signed)
Monitoring the schedule for openings. Otherwise waiting for the November schedule to be released.

## 2017-07-20 ENCOUNTER — Other Ambulatory Visit: Payer: Self-pay | Admitting: Internal Medicine

## 2017-07-21 ENCOUNTER — Other Ambulatory Visit: Payer: Self-pay | Admitting: Internal Medicine

## 2017-07-22 DIAGNOSIS — H2513 Age-related nuclear cataract, bilateral: Secondary | ICD-10-CM | POA: Diagnosis not present

## 2017-07-22 DIAGNOSIS — E119 Type 2 diabetes mellitus without complications: Secondary | ICD-10-CM | POA: Diagnosis not present

## 2017-07-22 DIAGNOSIS — D3132 Benign neoplasm of left choroid: Secondary | ICD-10-CM | POA: Diagnosis not present

## 2017-07-22 LAB — HM DIABETES EYE EXAM

## 2017-07-23 DIAGNOSIS — J45909 Unspecified asthma, uncomplicated: Secondary | ICD-10-CM | POA: Diagnosis not present

## 2017-07-29 ENCOUNTER — Other Ambulatory Visit: Payer: Self-pay | Admitting: Family Medicine

## 2017-07-29 DIAGNOSIS — Z1231 Encounter for screening mammogram for malignant neoplasm of breast: Secondary | ICD-10-CM

## 2017-07-31 DIAGNOSIS — R079 Chest pain, unspecified: Secondary | ICD-10-CM | POA: Diagnosis not present

## 2017-07-31 DIAGNOSIS — Z8719 Personal history of other diseases of the digestive system: Secondary | ICD-10-CM | POA: Diagnosis not present

## 2017-07-31 DIAGNOSIS — I10 Essential (primary) hypertension: Secondary | ICD-10-CM | POA: Diagnosis not present

## 2017-07-31 DIAGNOSIS — I2781 Cor pulmonale (chronic): Secondary | ICD-10-CM | POA: Diagnosis not present

## 2017-08-06 ENCOUNTER — Other Ambulatory Visit: Payer: Self-pay

## 2017-08-06 DIAGNOSIS — Z79891 Long term (current) use of opiate analgesic: Secondary | ICD-10-CM | POA: Diagnosis not present

## 2017-08-06 DIAGNOSIS — K222 Esophageal obstruction: Secondary | ICD-10-CM

## 2017-08-06 DIAGNOSIS — M47816 Spondylosis without myelopathy or radiculopathy, lumbar region: Secondary | ICD-10-CM | POA: Diagnosis not present

## 2017-08-06 DIAGNOSIS — S9422XS Injury of deep peroneal nerve at ankle and foot level, left leg, sequela: Secondary | ICD-10-CM | POA: Diagnosis not present

## 2017-08-06 DIAGNOSIS — G894 Chronic pain syndrome: Secondary | ICD-10-CM | POA: Diagnosis not present

## 2017-08-06 NOTE — Telephone Encounter (Signed)
Left the patient a message to call about the procedure date which is 10/29/17

## 2017-08-07 ENCOUNTER — Ambulatory Visit
Admission: RE | Admit: 2017-08-07 | Discharge: 2017-08-07 | Disposition: A | Discharge status: Home / Self Care | Payer: Medicare Other | Source: Ambulatory Visit | Attending: Family Medicine | Admitting: Family Medicine

## 2017-08-07 DIAGNOSIS — Z1231 Encounter for screening mammogram for malignant neoplasm of breast: Secondary | ICD-10-CM

## 2017-08-08 NOTE — Telephone Encounter (Signed)
I spoke to the patient. She is scheduled for her PV and her EGD. Please see the schedule. She states she has to ask people to drive her to her appointments. Late afternoon works best. PV 10/11/17 4 pm.

## 2017-08-16 ENCOUNTER — Encounter: Payer: Self-pay | Admitting: Family Medicine

## 2017-08-19 ENCOUNTER — Ambulatory Visit: Payer: Medicare Other | Admitting: Family Medicine

## 2017-08-21 DIAGNOSIS — Z4502 Encounter for adjustment and management of automatic implantable cardiac defibrillator: Secondary | ICD-10-CM | POA: Diagnosis not present

## 2017-08-21 DIAGNOSIS — R55 Syncope and collapse: Secondary | ICD-10-CM | POA: Diagnosis not present

## 2017-08-22 ENCOUNTER — Encounter: Payer: Self-pay | Admitting: Family Medicine

## 2017-08-22 ENCOUNTER — Ambulatory Visit (INDEPENDENT_AMBULATORY_CARE_PROVIDER_SITE_OTHER): Payer: Medicare Other | Admitting: Family Medicine

## 2017-08-22 VITALS — BP 108/80 | HR 71 | Temp 98.2°F | Wt 231.9 lb

## 2017-08-22 DIAGNOSIS — I420 Dilated cardiomyopathy: Secondary | ICD-10-CM

## 2017-08-22 DIAGNOSIS — I2510 Atherosclerotic heart disease of native coronary artery without angina pectoris: Secondary | ICD-10-CM

## 2017-08-22 DIAGNOSIS — E1169 Type 2 diabetes mellitus with other specified complication: Secondary | ICD-10-CM

## 2017-08-22 DIAGNOSIS — E785 Hyperlipidemia, unspecified: Secondary | ICD-10-CM

## 2017-08-22 DIAGNOSIS — I5032 Chronic diastolic (congestive) heart failure: Secondary | ICD-10-CM | POA: Diagnosis not present

## 2017-08-22 DIAGNOSIS — M79601 Pain in right arm: Secondary | ICD-10-CM | POA: Diagnosis not present

## 2017-08-22 DIAGNOSIS — E119 Type 2 diabetes mellitus without complications: Secondary | ICD-10-CM | POA: Diagnosis not present

## 2017-08-22 DIAGNOSIS — E1159 Type 2 diabetes mellitus with other circulatory complications: Secondary | ICD-10-CM

## 2017-08-22 DIAGNOSIS — I10 Essential (primary) hypertension: Secondary | ICD-10-CM

## 2017-08-22 DIAGNOSIS — Z23 Encounter for immunization: Secondary | ICD-10-CM

## 2017-08-22 DIAGNOSIS — L608 Other nail disorders: Secondary | ICD-10-CM

## 2017-08-22 NOTE — Patient Instructions (Signed)
BEFORE YOU LEAVE: -flu shot -xray sheet -labs -follow up: 3-4 months  Go get the xray  Healthy diet and regular exercise

## 2017-08-22 NOTE — Progress Notes (Signed)
HPI:  Shelby Anderson is a pleasant 61 y.o. here for follow up. Chronic medical problems summarized below were reviewed for changes. Reports she saw her cardiologist recently and unfortunately has some right heart failure that is new. She was told to exercise and eat healthy and her heart doctor is monitoring this. She has a new complaint of right forearm pain. She had a mechanical fall on her stairs to weeks ago and landed on this forearm. She developed bruising, swelling and immediate pain at the right distal forearm. This bruising and swelling have improved, but she has persistent pain. She did not seek care if she was able to move her hand and wrist. She continues to have problems with her toenails and thickened and discolored . Denies CP, SOB, DOE, treatment intolerance or new symptoms. Due for flu shot, labs (hgba1c), pap - sees gyn and agrees to schedule  Diabetes Mellitus: -borderline/mild diabetic for some time -takes cinnamon -no regular exercise, but she is active taking care of grandchildren -hx CAD (sees cardiologist), HLD, HTN, orthostattic hypotension - on statin and asa, not on acei - has asthma  GERD/IBS: -hx bad reflux, esophageal stricture s/p dilation and gastric ulcer -sees Dr. Henrene Pastor for this  -on nexium 40mg  bid, carafate, colace, prn anti-emetic  CAD hx MI x2 per her report/Arrythmia/HTN/HLD/orthostatic hypotension/presyncope, CdCHF: -seeing several cardiologists -meds: asa, metoprolol, fish oil, isosorbide, florinef, K+, lipitor, ntg prn -denies: CP, SOB, palpitations  Asthma/Allergies: -sees pulmonologist for this -meds: singulair, symbicort, albuterol  Insomnia: -uses benydrl occ for this -eszopiclone on med list from ? [facility], I have warned her of risks and advised against sleep aides -hx of low O2 at night  Chronic back pain/Knee pain: -hx DDD and neuropathy, OA knees -seeing [facility] pain management specialist and referred to ortho -on  daily oxycodone 30mg  3 times daily, lyrica- doesn't think any of them help; reports pain doctor doesn't want her to stop them, on methadone in the past and opana - reports reacted to them  -other meds: robaxin, mobix, Deplin, tylenol -aware of risks with medications, adamant about continuing mobic as feels is only medication that helps  MDD: -reports on deplin from pain management for this, does not feel helps -declined other treatments offered here -Chronic symptoms: mildly constant depressed mood and hopeless feelings at times, no SI, panic or manic symptoms  HRT/postmenopausl bleeding: -on prometrium with her gynecologist, Dr. Toney Rakes  ROS: See pertinent positives and negatives per HPI.  Past Medical History:  Diagnosis Date  . Anemia    "chronic"  . Angina   . Anxiety and depression   . Atrial fibrillation ([place])    h/o "AF w/frequent PVCs"  . Chronic back pain greater than 3 months duration    on chronic narcotics, treated at pain clinic  . Colon polyps    hyperplastic  . Coronary artery disease    Arrythmia, orthostatic hypotension, HLD, HTN; sees Dr. Einar Gip  . Difficult intubation    "TMJ & woke up when they were still cutting on me"  . Dysrhythmia    sees Dr. Einar Gip and a cardiologist at [facility]  . Esophageal stricture   . Fatty liver   . Fibroids   . Fibromyalgia    "in my legs"  . GERD (gastroesophageal reflux disease)    hx hiatal hernia, stricture and gastric ulcer  . Headache(784.0)   . Heart murmur   . Hiatal hernia   . History of loop recorder   . History of migraines    "  dx'd when I was in my teens"  . Hyperlipemia   . Hypertension   . Mental disorder   . Mild episode of recurrent major depressive disorder ([place]) 12/06/2015  . Myocardial infarction [facility]) 1980's & 1990;   sees Dr. Einar Gip  . Pneumonia   . PONV (postoperative nausea and vomiting)   . Recurrent upper respiratory infection (URI)   . Shortness of breath 11/20/11   "all the time",  sees pulmonlogy, ? asthma  . Stenotic cervical os   . Stomach ulcer    "3 small; found in 05/2011"  . TMJ (dislocation of temporomandibular joint)   . Tuberculosis    + TB SKIN TEST  . Type 2 diabetes mellitus without complication, without long-term current use of insulin ([place]) 12/06/2015    Past Surgical History:  Procedure Laterality Date  . ACHILLES TENDON REPAIR  1970's   left ankle  . ARTHROSCOPIC REPAIR ACL     left knee cap  . BREAST BIOPSY Right 04/08/2013  . CARDIAC CATHETERIZATION     loop recorder  . CARPAL TUNNEL RELEASE  unknown   left hand  . LOOP RECORDER IMPLANT    . post ganglionectomy  1970's   "for migraine headaches"  . pouch string  [phone]   "did this 3 times (once w/each pregnancy)"  . TOTAL KNEE ARTHROPLASTY Left 09/25/2016   Procedure: LEFT TOTAL KNEE ARTHROPLASTY;  Surgeon: Paralee Cancel, MD;  Location: [facility];  Service: Orthopedics;  Laterality: Left;  . TUBAL LIGATION  1980's    Family History  Problem Relation Age of Onset  . Malignant hyperthermia Father   . Hypertension Father   . Heart disease Father   . Diabetes Father   . Cancer Father        skin  . Hypertension Mother   . Heart disease Mother   . Cancer Sister        CERVICAL  . Hypertension Sister   . Cancer Brother        MELANOMA  . Heart disease Maternal Grandmother   . Heart disease Maternal Grandfather   . Cancer Paternal Grandmother        ?   Marland Kitchen Heart disease Paternal Grandmother   . Heart disease Paternal Grandfather   . Cancer Brother        LUNG  . Diabetes Sister   . Hypertension Sister   . Heart disease Sister   . Cancer Sister   . Cancer Brother   . Anesthesia problems Neg Hx   . Hypotension Neg Hx   . Pseudochol deficiency Neg Hx     Social History   Social History  . Marital status: Married    Spouse name: N/A  . Number of children: 3  . Years of education: N/A   Occupational History  . Retired Therapist, sports    Social History Main Topics  . Smoking  status: Never Smoker  . Smokeless tobacco: Never Used  . Alcohol use No  . Drug use: No  . Sexual activity: Yes    Birth control/ protection: Surgical   Other Topics Concern  . None   Social History Narrative  . None     Current Outpatient Prescriptions:  .  acetaminophen (TYLENOL) 500 MG tablet, Take 1,000 mg by mouth every 4 (four) hours as needed for fever., Disp: , Rfl:  .  albuterol (PROVENTIL) (2.5 MG/3ML) 0.083% nebulizer solution, Take 3 mLs (2.5 mg total) by nebulization every 6 (six) hours as needed for wheezing  or shortness of breath (J45.40)., Disp: 75 mL, Rfl: 3 .  aspirin EC 81 MG tablet, Take 81 mg by mouth daily., Disp: , Rfl:  .  atorvastatin (LIPITOR) 40 MG tablet, TAKE 1 TABLET BY MOUTH EVERY DAY, Disp: 90 tablet, Rfl: 1 .  budesonide-formoterol (SYMBICORT) 80-4.5 MCG/ACT inhaler, Take 2 puffs first thing in am and then another 2 puffs about 12 hours later., Disp: 1 Inhaler, Rfl: 11 .  Calcium Carb-Cholecalciferol (CALCIUM 600+D) 600-800 MG-UNIT TABS, Take 1 tablet by mouth daily., Disp: , Rfl:  .  CARAFATE 1 GM/10ML suspension, TAKE 10 MILLILITERS BY MOUTH 4 TIMES DAILY WITH MEALS AND AT BEDTIME, Disp: 420 mL, Rfl: 2 .  Cinnamon 500 MG capsule, Take 1,000 mg by mouth 2 (two) times daily. , Disp: , Rfl:  .  clobetasol cream (TEMOVATE) 9.51 %, Apply 1 application topically See admin instructions. Apply to affected area one to two times a week as needed for rash., Disp: , Rfl:  .  Cyanocobalamin (VITAMIN B-12) 5000 MCG TBDP, Take 5,000 mcg by mouth daily. , Disp: , Rfl:  .  docusate sodium (COLACE) 100 MG capsule, Take 1 capsule (100 mg total) by mouth 2 (two) times daily. (Patient taking differently: Take 100-200 mg by mouth daily as needed for mild constipation. ), Disp: 10 capsule, Rfl: 0 .  Eszopiclone (ESZOPICLONE) 3 MG TABS, Take 3 mg by mouth at bedtime as needed (insomnia). , Disp: , Rfl:  .  fludrocortisone (FLORINEF) 0.1 MG tablet, TAKE 1 TABLET BY MOUTH TWICE A  DAY (Patient taking differently: 1 tablet at bedtime), Disp: 60 tablet, Rfl: 5 .  fluticasone (FLONASE) 50 MCG/ACT nasal spray, Place 2 sprays into both nostrils daily as needed for allergies or rhinitis. , Disp: , Rfl:  .  hydrocortisone 2.5 % cream, Apply 1 application topically 2 (two) times daily as needed. dermatitis, Disp: , Rfl:  .  isosorbide mononitrate (IMDUR) 60 MG 24 hr tablet, TAKE 1 TABLET BY MOUTH EVERY DAY, Disp: 90 tablet, Rfl: 1 .  KLOR-CON M20 20 MEQ tablet, TAKE 1 TABLET BY MOUTH EVERY DAY, Disp: 30 tablet, Rfl: 5 .  L-Methylfolate (DEPLIN) 7.5 MG TABS, Take 7.5 mg by mouth daily with breakfast. , Disp: , Rfl:  .  LYRICA 75 MG capsule, Take 75 mg by mouth 3 (three) times daily. , Disp: , Rfl:  .  methocarbamol (ROBAXIN) 500 MG tablet, Take 1 tablet (500 mg total) by mouth every 6 (six) hours as needed for muscle spasms., Disp: 60 tablet, Rfl: 0 .  metoprolol (LOPRESSOR) 50 MG tablet, TAKE 1 TABLET TWICE A DAY, Disp: 180 tablet, Rfl: 1 .  Misc Natural Products (GLUCOS-CHONDROIT-MSM COMPLEX PO), Take 2 tablets by mouth daily., Disp: , Rfl:  .  montelukast (SINGULAIR) 10 MG tablet, TAKE 1 TABLET (10 MG TOTAL) BY MOUTH AT BEDTIME., Disp: 90 tablet, Rfl: 3 .  Multiple Vitamin (MULITIVITAMIN WITH MINERALS) TABS, Take 1 tablet by mouth daily.  , Disp: , Rfl:  .  NEXIUM 40 MG capsule, TAKE ONE CAPSULE BY MOUTH TWICE A DAY, Disp: 60 capsule, Rfl: 6 .  nitroGLYCERIN (NITROSTAT) 0.3 MG SL tablet, Place 0.3 mg under the tongue every 5 (five) minutes as needed for chest pain (as needed)., Disp: , Rfl:  .  NONFORMULARY OR COMPOUNDED ITEM, Estradiol .02% 1 ML Prefilled Applicator Sig: apply vaginally twice a week #90 Day Supply with 4 refills, Disp: 1 each, Rfl: 4 .  ondansetron (ZOFRAN) 4 MG tablet, Take 4 mg  by mouth every 4 (four) hours as needed for nausea or vomiting., Disp: , Rfl:  .  oxycodone (ROXICODONE) 30 MG immediate release tablet, Take 30 mg by mouth every 4 (four) hours as needed  for pain., Disp: , Rfl:  .  Oxycodone HCl 20 MG TABS, , Disp: , Rfl:  .  OXYGEN, Inhale 2 L into the lungs at bedtime., Disp: , Rfl:  .  polyethylene glycol (MIRALAX / GLYCOLAX) packet, Take 17 g by mouth 2 (two) times daily. (Patient taking differently: Take 17 g by mouth daily as needed for mild constipation. ), Disp: 14 each, Rfl: 0 .  PROAIR HFA 108 (90 Base) MCG/ACT inhaler, INHALE 2 PUFFS INTO THE LUNGS EVERY 4 (FOUR) HOURS AS NEEDED. SHORTNESS OF BREATH, Disp: 8.5 Inhaler, Rfl: 3 .  progesterone (PROMETRIUM) 200 MG capsule, TAKE 1 CAPSULE BY MOUTH EVERY DAY FOR FIRST 12 DAYS OF MONTH, Disp: 36 capsule, Rfl: 2 .  promethazine (PHENERGAN) 25 MG tablet, TAKE 1 TABLET BY MOUTH AT BEDTIME AS NEEDED FOR NAUSEA AND VOMITING, Disp: 30 tablet, Rfl: 1  EXAM:  Vitals:   08/22/17 1542  BP: 108/80  Pulse: 71  Temp: 98.2 F (36.8 C)  SpO2: 93%    Body mass index is 37.43 kg/m.  GENERAL: vitals reviewed and listed above, alert, oriented, appears well hydrated and in no acute distress  HEENT: atraumatic, conjunttiva clear, no obvious abnormalities on inspection of external nose and ears  NECK: no obvious masses on inspection  LUNGS: clear to auscultation bilaterally, no wheezes, rales or rhonchi, good air movement  CV: HRRR, no peripheral edema  MS: moves all extremities without noticeable abnormality, obvious swelling in an area of the distal forearm about 1 inch from the distal and, tenderness to palpation at the radius and ulna here, she is able to use her hand and flex and extend her wrist, but range of motion and strength are limited primarily it seems due to pain, normal radial pulses, normal sensitivity to light touch throughout  PSYCH: pleasant and cooperative, no obvious depression or anxiety  ASSESSMENT AND PLAN:  Discussed the following assessment and plan:  Right arm pain - Plan: DG Forearm Right -plain films to exclude fracture  Toenail deformity  - Plan: Ambulatory  referral to Podiatry  Hyperlipidemia associated with type 2 diabetes mellitus ([place]) Coronary artery disease involving native coronary artery of native heart without angina pectoris Hypertension associated with diabetes ([place]) - Plan: Basic metabolic panel, CBC Dilated cardiomyopathy ([place]) Chronic diastolic congestive heart failure ([place]) -sees cardiologist for management -Advised healthy diet and regular gentle exercise -Follow with cardiology for management of medications  Type 2 diabetes mellitus without complication, without long-term current use of insulin ([place]) - Plan: Hemoglobin A1c, Ambulatory referral to Podiatry Morbid obesity due to excess calories ([place]) -labs today -Lifestyle recommendations -Referral to podiatry for toenail deformities, she also has hammer toes and callus formation in the setting of diabetes  Shot today  Advised to schedule her Pap smear and she agrees to call her gynecologist  -Patient advised to return or notify a doctor immediately if symptoms worsen or persist or new concerns arise.  Patient Instructions  BEFORE YOU LEAVE: -flu shot -xray sheet -labs -follow up: 3-4 months  Go get the xray  Healthy diet and regular exercise      Colin Benton R., DO

## 2017-08-23 ENCOUNTER — Ambulatory Visit (INDEPENDENT_AMBULATORY_CARE_PROVIDER_SITE_OTHER)
Admission: RE | Admit: 2017-08-23 | Discharge: 2017-08-23 | Disposition: A | Discharge status: Home / Self Care | Payer: Medicare Other | Source: Ambulatory Visit | Attending: Family Medicine | Admitting: Family Medicine

## 2017-08-23 DIAGNOSIS — M79601 Pain in right arm: Secondary | ICD-10-CM | POA: Diagnosis not present

## 2017-08-23 DIAGNOSIS — L298 Other pruritus: Secondary | ICD-10-CM | POA: Diagnosis not present

## 2017-08-23 DIAGNOSIS — J45909 Unspecified asthma, uncomplicated: Secondary | ICD-10-CM | POA: Diagnosis not present

## 2017-08-23 DIAGNOSIS — L218 Other seborrheic dermatitis: Secondary | ICD-10-CM | POA: Diagnosis not present

## 2017-08-23 DIAGNOSIS — L570 Actinic keratosis: Secondary | ICD-10-CM | POA: Diagnosis not present

## 2017-08-23 DIAGNOSIS — D1801 Hemangioma of skin and subcutaneous tissue: Secondary | ICD-10-CM | POA: Diagnosis not present

## 2017-08-23 DIAGNOSIS — L820 Inflamed seborrheic keratosis: Secondary | ICD-10-CM | POA: Diagnosis not present

## 2017-08-23 DIAGNOSIS — M79631 Pain in right forearm: Secondary | ICD-10-CM | POA: Diagnosis not present

## 2017-08-23 DIAGNOSIS — S59911A Unspecified injury of right forearm, initial encounter: Secondary | ICD-10-CM | POA: Diagnosis not present

## 2017-08-23 LAB — CBC
HCT: 40.4 % (ref 36.0–46.0)
Hemoglobin: 13.1 g/dL (ref 12.0–15.0)
MCHC: 32.3 g/dL (ref 30.0–36.0)
MCV: 87.7 fl (ref 78.0–100.0)
Platelets: 247 10*3/uL (ref 150.0–400.0)
RBC: 4.61 Mil/uL (ref 3.87–5.11)
RDW: 14.7 % (ref 11.5–15.5)
WBC: 5.1 10*3/uL (ref 4.0–10.5)

## 2017-08-23 LAB — BASIC METABOLIC PANEL
BUN: 11 mg/dL (ref 6–23)
CALCIUM: 9.1 mg/dL (ref 8.4–10.5)
CHLORIDE: 102 meq/L (ref 96–112)
CO2: 35 mEq/L — ABNORMAL HIGH (ref 19–32)
CREATININE: 0.59 mg/dL (ref 0.40–1.20)
GFR: 110.72 mL/min (ref >60.00)
Glucose, Bld: 168 mg/dL — ABNORMAL HIGH (ref 70–99)
Potassium: 3.7 mEq/L (ref 3.5–5.1)
Sodium: 142 mEq/L (ref 135–145)

## 2017-08-23 LAB — HEMOGLOBIN A1C: HEMOGLOBIN A1C: 7.7 % — AB (ref 4.6–6.5)

## 2017-08-26 DIAGNOSIS — Z4509 Encounter for adjustment and management of other cardiac device: Secondary | ICD-10-CM | POA: Diagnosis not present

## 2017-08-26 DIAGNOSIS — R55 Syncope and collapse: Secondary | ICD-10-CM | POA: Diagnosis not present

## 2017-09-05 ENCOUNTER — Encounter: Payer: Self-pay | Admitting: Family Medicine

## 2017-09-05 ENCOUNTER — Ambulatory Visit (INDEPENDENT_AMBULATORY_CARE_PROVIDER_SITE_OTHER): Payer: Medicare Other | Admitting: Family Medicine

## 2017-09-05 VITALS — BP 130/80 | HR 84 | Temp 99.4°F | Ht 66.0 in | Wt 233.1 lb

## 2017-09-05 DIAGNOSIS — R112 Nausea with vomiting, unspecified: Secondary | ICD-10-CM | POA: Diagnosis not present

## 2017-09-05 MED ORDER — ONDANSETRON HCL 4 MG PO TABS: 4.0000 mg | ORAL_TABLET | Freq: Three times a day (TID) | ORAL | 0 refills | Status: DC | PRN

## 2017-09-05 NOTE — Progress Notes (Addendum)
HPI:  Was here to talk about her diabetes, however became sick a few hours ago and doesn't feel like discussing this currently. Reports she would've canceled her appointment but she was afraid of a late canceled see and she wants a refill on her Zofran. Reports she became acutely ill the last few hours with nausea and vomiting several times, malaise, cough and congestion. Her grandchildren who stay with her been sick recently. She is seeing a gastroenterologist for recurrent problems with nausea and vomiting and takes Phenergan every night, usually takes Zofran during the day but is out currently. Reports she has a follow-up with GI in a few weeks. Denies melena, hematochezia, abdominal pain, inability to tolerate oral fluids.   ROS: See pertinent positives and negatives per HPI.  Past Medical History:  Diagnosis Date  . Anemia    "chronic"  . Angina   . Anxiety and depression   . Atrial fibrillation ([place])    h/o "AF w/frequent PVCs"  . Chronic back pain greater than 3 months duration    on chronic narcotics, treated at pain clinic  . Colon polyps    hyperplastic  . Coronary artery disease    Arrythmia, orthostatic hypotension, HLD, HTN; sees Dr. Einar Gip  . Difficult intubation    "TMJ & woke up when they were still cutting on me"  . Dysrhythmia    sees Dr. Einar Gip and a cardiologist at [facility]  . Esophageal stricture   . Fatty liver   . Fibroids   . Fibromyalgia    "in my legs"  . GERD (gastroesophageal reflux disease)    hx hiatal hernia, stricture and gastric ulcer  . Headache(784.0)   . Heart murmur   . Hiatal hernia   . History of loop recorder   . History of migraines    "dx'd when I was in my teens"  . Hyperlipemia   . Hypertension   . Mental disorder   . Mild episode of recurrent major depressive disorder ([place]) 12/06/2015  . Myocardial infarction [facility]) 1980's & 1990;   sees Dr. Einar Gip  . Pneumonia   . PONV (postoperative nausea and vomiting)   . Recurrent upper  respiratory infection (URI)   . Shortness of breath 11/20/11   "all the time", sees pulmonlogy, ? asthma  . Stenotic cervical os   . Stomach ulcer    "3 small; found in 05/2011"  . TMJ (dislocation of temporomandibular joint)   . Tuberculosis    + TB SKIN TEST  . Type 2 diabetes mellitus without complication, without long-term current use of insulin ([place]) 12/06/2015    Past Surgical History:  Procedure Laterality Date  . ACHILLES TENDON REPAIR  1970's   left ankle  . ARTHROSCOPIC REPAIR ACL     left knee cap  . BREAST BIOPSY Right 04/08/2013  . CARDIAC CATHETERIZATION     loop recorder  . CARPAL TUNNEL RELEASE  unknown   left hand  . LOOP RECORDER IMPLANT    . post ganglionectomy  1970's   "for migraine headaches"  . pouch string  [phone]   "did this 3 times (once w/each pregnancy)"  . TOTAL KNEE ARTHROPLASTY Left 09/25/2016   Procedure: LEFT TOTAL KNEE ARTHROPLASTY;  Surgeon: Paralee Cancel, MD;  Location: [facility];  Service: Orthopedics;  Laterality: Left;  . TUBAL LIGATION  1980's    Family History  Problem Relation Age of Onset  . Malignant hyperthermia Father   . Hypertension Father   . Heart disease Father   .  Diabetes Father   . Cancer Father        skin  . Hypertension Mother   . Heart disease Mother   . Cancer Sister        CERVICAL  . Hypertension Sister   . Cancer Brother        MELANOMA  . Heart disease Maternal Grandmother   . Heart disease Maternal Grandfather   . Cancer Paternal Grandmother        ?   Marland Kitchen Heart disease Paternal Grandmother   . Heart disease Paternal Grandfather   . Cancer Brother        LUNG  . Diabetes Sister   . Hypertension Sister   . Heart disease Sister   . Cancer Sister   . Cancer Brother   . Anesthesia problems Neg Hx   . Hypotension Neg Hx   . Pseudochol deficiency Neg Hx     Social History   Social History  . Marital status: Married    Spouse name: N/A  . Number of children: 3  . Years of education: N/A    Occupational History  . Retired Therapist, sports    Social History Main Topics  . Smoking status: Never Smoker  . Smokeless tobacco: Never Used  . Alcohol use No  . Drug use: No  . Sexual activity: Yes    Birth control/ protection: Surgical   Other Topics Concern  . None   Social History Narrative  . None     Current Outpatient Prescriptions:  .  acetaminophen (TYLENOL) 500 MG tablet, Take 1,000 mg by mouth every 4 (four) hours as needed for fever., Disp: , Rfl:  .  albuterol (PROVENTIL) (2.5 MG/3ML) 0.083% nebulizer solution, Take 3 mLs (2.5 mg total) by nebulization every 6 (six) hours as needed for wheezing or shortness of breath (J45.40)., Disp: 75 mL, Rfl: 3 .  aspirin EC 81 MG tablet, Take 81 mg by mouth daily., Disp: , Rfl:  .  atorvastatin (LIPITOR) 40 MG tablet, TAKE 1 TABLET BY MOUTH EVERY DAY, Disp: 90 tablet, Rfl: 1 .  budesonide-formoterol (SYMBICORT) 80-4.5 MCG/ACT inhaler, Take 2 puffs first thing in am and then another 2 puffs about 12 hours later., Disp: 1 Inhaler, Rfl: 11 .  Calcium Carb-Cholecalciferol (CALCIUM 600+D) 600-800 MG-UNIT TABS, Take 1 tablet by mouth daily., Disp: , Rfl:  .  CARAFATE 1 GM/10ML suspension, TAKE 10 MILLILITERS BY MOUTH 4 TIMES DAILY WITH MEALS AND AT BEDTIME, Disp: 420 mL, Rfl: 2 .  Cinnamon 500 MG capsule, Take 1,000 mg by mouth 2 (two) times daily. , Disp: , Rfl:  .  clobetasol cream (TEMOVATE) 6.37 %, Apply 1 application topically See admin instructions. Apply to affected area one to two times a week as needed for rash., Disp: , Rfl:  .  Cyanocobalamin (VITAMIN B-12) 5000 MCG TBDP, Take 5,000 mcg by mouth daily. , Disp: , Rfl:  .  docusate sodium (COLACE) 100 MG capsule, Take 1 capsule (100 mg total) by mouth 2 (two) times daily. (Patient taking differently: Take 100-200 mg by mouth daily as needed for mild constipation. ), Disp: 10 capsule, Rfl: 0 .  Eszopiclone (ESZOPICLONE) 3 MG TABS, Take 3 mg by mouth at bedtime as needed (insomnia). , Disp:  , Rfl:  .  fludrocortisone (FLORINEF) 0.1 MG tablet, TAKE 1 TABLET BY MOUTH TWICE A DAY (Patient taking differently: 1 tablet at bedtime), Disp: 60 tablet, Rfl: 5 .  fluticasone (FLONASE) 50 MCG/ACT nasal spray, Place 2 sprays into  both nostrils daily as needed for allergies or rhinitis. , Disp: , Rfl:  .  hydrocortisone 2.5 % cream, Apply 1 application topically 2 (two) times daily as needed. dermatitis, Disp: , Rfl:  .  isosorbide mononitrate (IMDUR) 60 MG 24 hr tablet, TAKE 1 TABLET BY MOUTH EVERY DAY, Disp: 90 tablet, Rfl: 1 .  KLOR-CON M20 20 MEQ tablet, TAKE 1 TABLET BY MOUTH EVERY DAY, Disp: 30 tablet, Rfl: 5 .  L-Methylfolate (DEPLIN) 7.5 MG TABS, Take 7.5 mg by mouth daily with breakfast. , Disp: , Rfl:  .  LYRICA 75 MG capsule, Take 75 mg by mouth 3 (three) times daily. , Disp: , Rfl:  .  methocarbamol (ROBAXIN) 500 MG tablet, Take 1 tablet (500 mg total) by mouth every 6 (six) hours as needed for muscle spasms., Disp: 60 tablet, Rfl: 0 .  metoprolol (LOPRESSOR) 50 MG tablet, TAKE 1 TABLET TWICE A DAY, Disp: 180 tablet, Rfl: 1 .  Misc Natural Products (GLUCOS-CHONDROIT-MSM COMPLEX PO), Take 2 tablets by mouth daily., Disp: , Rfl:  .  montelukast (SINGULAIR) 10 MG tablet, TAKE 1 TABLET (10 MG TOTAL) BY MOUTH AT BEDTIME., Disp: 90 tablet, Rfl: 3 .  Multiple Vitamin (MULITIVITAMIN WITH MINERALS) TABS, Take 1 tablet by mouth daily.  , Disp: , Rfl:  .  NEXIUM 40 MG capsule, TAKE ONE CAPSULE BY MOUTH TWICE A DAY, Disp: 60 capsule, Rfl: 6 .  nitroGLYCERIN (NITROSTAT) 0.3 MG SL tablet, Place 0.3 mg under the tongue every 5 (five) minutes as needed for chest pain (as needed)., Disp: , Rfl:  .  NONFORMULARY OR COMPOUNDED ITEM, Estradiol .02% 1 ML Prefilled Applicator Sig: apply vaginally twice a week #90 Day Supply with 4 refills, Disp: 1 each, Rfl: 4 .  ondansetron (ZOFRAN) 4 MG tablet, Take 1 tablet (4 mg total) by mouth every 8 (eight) hours as needed for nausea or vomiting., Disp: 20 tablet, Rfl:  0 .  oxycodone (ROXICODONE) 30 MG immediate release tablet, Take 30 mg by mouth every 4 (four) hours as needed for pain., Disp: , Rfl:  .  Oxycodone HCl 20 MG TABS, , Disp: , Rfl:  .  OXYGEN, Inhale 2 L into the lungs at bedtime., Disp: , Rfl:  .  polyethylene glycol (MIRALAX / GLYCOLAX) packet, Take 17 g by mouth 2 (two) times daily. (Patient taking differently: Take 17 g by mouth daily as needed for mild constipation. ), Disp: 14 each, Rfl: 0 .  PROAIR HFA 108 (90 Base) MCG/ACT inhaler, INHALE 2 PUFFS INTO THE LUNGS EVERY 4 (FOUR) HOURS AS NEEDED. SHORTNESS OF BREATH, Disp: 8.5 Inhaler, Rfl: 3 .  progesterone (PROMETRIUM) 200 MG capsule, TAKE 1 CAPSULE BY MOUTH EVERY DAY FOR FIRST 12 DAYS OF MONTH, Disp: 36 capsule, Rfl: 2 .  promethazine (PHENERGAN) 25 MG tablet, TAKE 1 TABLET BY MOUTH AT BEDTIME AS NEEDED FOR NAUSEA AND VOMITING, Disp: 30 tablet, Rfl: 1  EXAM:  Vitals:   09/05/17 1521  BP: 130/80  Pulse: 84  Temp: 99.4 F (37.4 C)    Body mass index is 37.62 kg/m.  GENERAL: vitals reviewed and listed above, alert, oriented, appears well hydrated and in no acute distress  HEENT: atraumatic, conjunttiva clear, no obvious abnormalities on inspection of external nose and ears  NECK: no obvious masses on inspection  LUNGS: clear to auscultation bilaterally, no wheezes, rales or rhonchi, good air movement  CV: HRRR, no peripheral edema  ABD: bowel sounds positive, soft, nontender to palpation  MS: moves  all extremities without noticeable abnormality  PSYCH: pleasant and cooperative, no obvious depression or anxiety  ASSESSMENT AND PLAN:  Discussed the following assessment and plan:  Nausea and vomiting, intractability of vomiting not specified, unspecified vomiting type  -advised follow-up with her gastroenterologist and that she notify them of her symptoms -Small amount of Zofran provided in the interim -We'll plan follow-up about her diabetes when she is feeling better  and likely will start metformin -recommendations for oral rehydration and emergency precautions discussed -Patient advised to return or notify a doctor immediately if symptoms worsen or persist or new concerns arise.  Declined AVS  There are no Patient Instructions on file for this visit.  Colin Benton R., DO

## 2017-09-22 ENCOUNTER — Other Ambulatory Visit: Payer: Self-pay | Admitting: Internal Medicine

## 2017-09-22 DIAGNOSIS — J45909 Unspecified asthma, uncomplicated: Secondary | ICD-10-CM | POA: Diagnosis not present

## 2017-09-27 ENCOUNTER — Other Ambulatory Visit: Payer: Self-pay | Admitting: Pulmonary Disease

## 2017-09-30 ENCOUNTER — Other Ambulatory Visit: Payer: Self-pay | Admitting: Family Medicine

## 2017-10-03 DIAGNOSIS — Z79891 Long term (current) use of opiate analgesic: Secondary | ICD-10-CM | POA: Diagnosis not present

## 2017-10-03 DIAGNOSIS — S9422XS Injury of deep peroneal nerve at ankle and foot level, left leg, sequela: Secondary | ICD-10-CM | POA: Diagnosis not present

## 2017-10-03 DIAGNOSIS — M47816 Spondylosis without myelopathy or radiculopathy, lumbar region: Secondary | ICD-10-CM | POA: Diagnosis not present

## 2017-10-03 DIAGNOSIS — G894 Chronic pain syndrome: Secondary | ICD-10-CM | POA: Diagnosis not present

## 2017-10-05 ENCOUNTER — Other Ambulatory Visit: Payer: Self-pay | Admitting: Family Medicine

## 2017-10-17 ENCOUNTER — Encounter (HOSPITAL_COMMUNITY): Payer: Self-pay | Admitting: *Deleted

## 2017-10-17 ENCOUNTER — Telehealth: Payer: Self-pay | Admitting: Pulmonary Disease

## 2017-10-17 ENCOUNTER — Other Ambulatory Visit: Payer: Self-pay

## 2017-10-17 NOTE — Telephone Encounter (Signed)
Patient stated that she has a cough and not feeling well. She declined having medication called in for her. She wanted to be seen by TP tomorrow due to transportation issues.   Spoke with Janett Billow, it is ok to schedule the patient for 1130 tomorrow with TP.   Patient is aware. Nothing else needed at time of call.

## 2017-10-17 NOTE — Progress Notes (Signed)
Patient with current cough.  Instructed  Patient to be evaluated by PCP prior to procedure if does not improve.   Requested LOV, EKG, echo from office of Ddr Ganji done 07/2017.

## 2017-10-18 ENCOUNTER — Other Ambulatory Visit: Payer: Self-pay

## 2017-10-18 ENCOUNTER — Ambulatory Visit (AMBULATORY_SURGERY_CENTER): Payer: Self-pay

## 2017-10-18 ENCOUNTER — Encounter: Payer: Self-pay | Admitting: Adult Health

## 2017-10-18 ENCOUNTER — Ambulatory Visit: Payer: Medicare Other | Admitting: Adult Health

## 2017-10-18 VITALS — Ht 66.0 in | Wt 234.4 lb

## 2017-10-18 DIAGNOSIS — K219 Gastro-esophageal reflux disease without esophagitis: Secondary | ICD-10-CM

## 2017-10-18 DIAGNOSIS — J301 Allergic rhinitis due to pollen: Secondary | ICD-10-CM | POA: Diagnosis not present

## 2017-10-18 DIAGNOSIS — J45991 Cough variant asthma: Secondary | ICD-10-CM

## 2017-10-18 DIAGNOSIS — K222 Esophageal obstruction: Secondary | ICD-10-CM

## 2017-10-18 MED ORDER — AZITHROMYCIN 250 MG PO TABS: ORAL_TABLET | ORAL | 0 refills | Status: AC

## 2017-10-18 MED ORDER — PREDNISONE 10 MG PO TABS: ORAL_TABLET | ORAL | 0 refills | Status: DC

## 2017-10-18 NOTE — Assessment & Plan Note (Signed)
GERD diet  follow up with GI as planned

## 2017-10-18 NOTE — Assessment & Plan Note (Signed)
Flare with bronchitis   Plan  Patient Instructions  Zpack take as directed .  Prednisone taper over next week.  Mucinex DM Twice daily As needed  Cough/congestion  Use albuterol neb every 6hr as needed.  Follow up with Dr. Elsworth Soho  In 3 months and As needed   Please contact office for sooner follow up if symptoms do not improve or worsen or seek emergency care

## 2017-10-18 NOTE — Progress Notes (Signed)
@Patient  ID: Shelby Anderson, female    DOB: 1956-07-19, 61 y.o.   MRN: [id number]  Chief Complaint  Patient presents with  . 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  She was evaluated for CAD at [facility] for syncope.&has a loop recorder - brady &int a - fibn  Significant tests/ events Spirometry 12/2011 - no airway obstruction -No reversibility with bronchodilator , mod restriction Her husband smokes.. She has positive PPD.  CXR LLL nodular infx - resolved 04/2012  Home sleep test 07/2012 - showed desatn, but could not afford O2   Jan 2015 -admitted asthma exacerbation, community-acquired pneumonia involving the right upper lobe and RSV bronchitis.  EGD in 03/2011 (Bu[facility]) has shown peptic ulcer disease.  12/2013 barium esophagram - distal esophageal stricture.  stricture dilated (per[place]) >>Restarted Singulair 04/2015    10/18/2017 Acute OV : Asthma  Pt presents for an acute office visit . She complains of 1 -2 weeks of co the ugh, congestion , drainage and intermittent wheezing . Breathing got worse with change in weather.  Several family members have been sick with URI and Strep throat.  Denies fever, chest pain, orthopnea. Edema or hemotpysis.  She has upcoming EGD in 2 weeks .    Allergies  Allergen Reactions  . Lodine [Etodolac] Anaphylaxis, Hives and Swelling  . Oxycontin [Oxycodone Hcl] Anaphylaxis    hives, trouble breathing, tongue swelling (Only Oxycontin) Tolerates plain oxycodone.  Marland Kitchen Penicillins Anaphylaxis    Told by a surgeon never to take it again. Has patient had a PCN reaction causing immediate rash, facial/tongue/throat swelling, SOB or lightheadedness with hypotension: Yes Has patient had a PCN reaction causing severe rash involving mucus membranes or  skin necrosis: Unknown Has patient had a PCN reaction that required hospitalization: No Has patient had a PCN reaction occurring within the last 10 years: No If all of the above answers are "NO", then may proceed with Cephalosporin use.  . Aspirin Other (See Comments)    High-dose caused GI Bleeds  . Darvocet [Propoxyphene N-Acetaminophen] Hives  . Nitroglycerin     IV-BP drops dramatically  . Ultram [Tramadol Hcl] Hives  . Valium Other (See Comments)    Circulation problems. "Legs turned black".    Immunization History  Administered Date(s) Administered  . Influenza Split 08/19/2011  . Influenza Whole 01/03/2013  . Influenza,inj,Quad PF,6+ Mos 08/20/2013, 09/15/2014, 08/26/2015, 08/01/2016, 08/22/2017  . Influenza-Unspecified 08/03/2016  . Pneumococcal Polysaccharide-23 08/19/2011  . Tdap 06/28/2014    Past Medical History:  Diagnosis Date  . Allergy   . Anemia    "chronic"  . Angina   . Anxiety and depression   . Arthritis   . Asthma   . Atrial fibrillation ([place])    h/o "AF w/frequent PVCs"  . Cancer (HCC)    hx of skin cancer   . CHF (congestive heart failure) ([place])   . Chronic back pain greater than 3 months duration    on chronic narcotics, treated at pain clinic  . Colon polyps    hyperplastic  . Coronary artery disease    Arrythmia, orthostatic hypotension, HLD, HTN; sees Dr. Einar Gip  . Difficult intubation    "TMJ & woke up when they were still cutting on me"  . Dysrhythmia    sees  Dr. Einar Gip and a cardiologist at [facility]  . Esophageal stricture   . Fatty liver   . Fibroids   . Fibromyalgia    "in my legs"  . GERD (gastroesophageal reflux disease)    hx hiatal hernia, stricture and gastric ulcer  . Headache(784.0)   . Heart murmur   . Hiatal hernia   . History of loop recorder   . History of migraines    "dx'd when I was in my teens"  . Hyperlipemia   . Hypertension   . Mental disorder   . Mild episode of recurrent major depressive disorder  ([place]) 12/06/2015  . Myocardial infarction [facility]) 1980's & 1990;   sees Dr. Einar Gip  . Pneumonia   . PONV (postoperative nausea and vomiting)   . Recurrent upper respiratory infection (URI)   . Shortness of breath 11/20/11   "all the time", sees pulmonlogy, ? asthma  . Stenotic cervical os   . Stomach ulcer    "3 small; found in 05/2011"  . TMJ (dislocation of temporomandibular joint)   . Tuberculosis    + TB SKIN TEST  . Type 2 diabetes mellitus without complication, without long-term current use of insulin ([place]) 12/06/2015   not on meds     Tobacco History: Social History   Tobacco Use  Smoking Status Never Smoker  Smokeless Tobacco Never Used   Counseling given: Not Answered   Outpatient Encounter Medications as of 10/18/2017  Medication Sig  . acetaminophen (TYLENOL) 500 MG tablet Take 1,000 mg by mouth every 4 (four) hours as needed for fever.  Marland Kitchen albuterol (PROAIR HFA) 108 (90 Base) MCG/ACT inhaler Inhale 2 puffs into the lungs every 4 hours as needed for shortness of breath  . albuterol (PROVENTIL) (2.5 MG/3ML) 0.083% nebulizer solution Take 3 mLs (2.5 mg total) by nebulization every 6 (six) hours as needed for wheezing or shortness of breath (J45.40).  Marland Kitchen aspirin EC 81 MG tablet Take 81 mg by mouth daily.  Marland Kitchen atorvastatin (LIPITOR) 40 MG tablet TAKE 1 TABLET BY MOUTH EVERY DAY  . budesonide-formoterol (SYMBICORT) 80-4.5 MCG/ACT inhaler Take 2 puffs first thing in am and then another 2 puffs about 12 hours later.  . Calcium Carb-Cholecalciferol (CALCIUM 600+D) 600-800 MG-UNIT TABS Take 1 tablet by mouth daily.  Marland Kitchen CARAFATE 1 GM/10ML suspension TAKE 10 MILLILITERS BY MOUTH 4 TIMES DAILY WITH MEALS AND AT BEDTIME  . Cinnamon 500 MG capsule Take 1,000 mg by mouth 2 (two) times daily.   . clobetasol cream (TEMOVATE) 9.76 % Apply 1 application topically See admin instructions. Apply to affected area one to two times a week as needed for rash.  . Cyanocobalamin (VITAMIN B-12) 5000 MCG  TBDP Take 5,000 mcg by mouth daily.   Marland Kitchen docusate sodium (COLACE) 100 MG capsule Take 1 capsule (100 mg total) by mouth 2 (two) times daily. (Patient taking differently: Take 100-200 mg by mouth daily as needed for mild constipation. )  . Eszopiclone (ESZOPICLONE) 3 MG TABS Take 3 mg by mouth at bedtime as needed (insomnia).   . fluticasone (FLONASE) 50 MCG/ACT nasal spray Place 2 sprays into both nostrils daily as needed for allergies or rhinitis.   . hydrocortisone 2.5 % cream Apply 1 application topically 2 (two) times daily as needed. dermatitis  . isosorbide mononitrate (IMDUR) 60 MG 24 hr tablet TAKE 1 TABLET BY MOUTH EVERY DAY  . KLOR-CON M20 20 MEQ tablet TAKE 1 TABLET BY MOUTH EVERY DAY  . L-Methylfolate (DEPLIN) 7.5 MG  TABS Take 7.5 mg by mouth daily with breakfast.   . LYRICA 75 MG capsule Take 75 mg by mouth 3 (three) times daily.   . methocarbamol (ROBAXIN) 500 MG tablet Take 1 tablet (500 mg total) by mouth every 6 (six) hours as needed for muscle spasms.  . metoprolol tartrate (LOPRESSOR) 50 MG tablet TAKE 1 TABLET TWICE A DAY  . Misc Natural Products (GLUCOS-CHONDROIT-MSM COMPLEX PO) Take 2 tablets by mouth daily.  . montelukast (SINGULAIR) 10 MG tablet TAKE 1 TABLET (10 MG TOTAL) BY MOUTH AT BEDTIME.  . Multiple Vitamin (MULITIVITAMIN WITH MINERALS) TABS Take 1 tablet by mouth daily.    Marland Kitchen NEXIUM 40 MG capsule TAKE ONE CAPSULE BY MOUTH TWICE A DAY  . nitroGLYCERIN (NITROSTAT) 0.3 MG SL tablet Place 0.3 mg under the tongue every 5 (five) minutes as needed for chest pain (as needed).  . NONFORMULARY OR COMPOUNDED ITEM Estradiol .02% 1 ML Prefilled Applicator Sig: apply vaginally twice a week #90 Day Supply with 4 refills  . ondansetron (ZOFRAN) 4 MG tablet Take 1 tablet (4 mg total) by mouth every 8 (eight) hours as needed for nausea or vomiting.  . Oxycodone HCl 20 MG TABS   . OXYGEN Inhale 2 L into the lungs at bedtime.  . polyethylene glycol (MIRALAX / GLYCOLAX) packet Take 17  g by mouth 2 (two) times daily. (Patient taking differently: Take 17 g by mouth daily as needed for mild constipation. )  . progesterone (PROMETRIUM) 200 MG capsule TAKE 1 CAPSULE BY MOUTH EVERY DAY FOR FIRST 12 DAYS OF MONTH  . promethazine (PHENERGAN) 25 MG tablet TAKE 1 TABLET BY MOUTH AT BEDTIME AS NEEDED FOR NAUSEA AND VOMITING  . azithromycin (ZITHROMAX Z-PAK) 250 MG tablet Take 2 tablets (500 mg) on  Day 1,  followed by 1 tablet (250 mg) once daily on Days 2 through 5.  . predniSONE (DELTASONE) 10 MG tablet 4 tabs for 2 days, then 3 tabs for 2 days, 2 tabs for 2 days, then 1 tab for 2 days, then stop   No facility-administered encounter medications on file as of 10/18/2017.      Review of Systems  Constitutional:   No  weight loss, night sweats,  Fevers, chills, fatigue, or  lassitude.  HEENT:   No headaches,  Difficulty swallowing,  Tooth/dental problems, or  Sore throat,                No sneezing, itching, ear ache,  +nasal congestion, post nasal drip,   CV:  No chest pain,  Orthopnea, PND, swelling in lower extremities, anasarca, dizziness, palpitations, syncope.   GI  No heartburn, indigestion, abdominal pain, nausea, vomiting, diarrhea, change in bowel habits, loss of appetite, bloody stools.   Resp: No chest wall deformity  Skin: no rash or lesions.  GU: no dysuria, change in color of urine, no urgency or frequency.  No flank pain, no hematuria   MS:  No joint pain or swelling.  No decreased range of motion.  No back pain.    Physical Exam  BP 134/74 (BP Location: Left Arm, Cuff Size: Normal)   Pulse 65   Temp 98.7 F (37.1 C) (Oral)   Ht 5\' 6"  (1.676 m)   Wt 233 lb 3.2 oz (105.8 kg)   SpO2 94%   BMI 37.64 kg/m   GEN: A/Ox3; pleasant , NAD, obese    HEENT:  [place]/AT,  EACs-clear, TMs-wnl, NOSE-clear, THROAT-clear, no lesions, no postnasal drip or exudate noted.  NECK:  Supple w/ fair ROM; no JVD; normal carotid impulses w/o bruits; no thyromegaly or nodules  palpated; no lymphadenopathy.    RESP  Few trace rhonchi  no accessory muscle use, no dullness to percussion  CARD:  RRR, no m/r/g, no peripheral edema, pulses intact, no cyanosis or clubbing.  GI:   Soft & nt; nml bowel sounds; no organomegaly or masses detected.   Musco: Warm bil, no deformities or joint swelling noted.   Neuro: alert, no focal deficits noted.    Skin: Warm, no lesions or rashes     BNP No results found for: BNP  ProBNP  Imaging: No results found.   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  Prednisone taper over next week.  Mucinex DM Twice daily As needed  Cough/congestion  Use albuterol neb every 6hr as needed.  Follow up with Dr. Elsworth Soho  In 3 months and As needed   Please contact office for sooner follow up if symptoms do not improve or worsen or seek emergency care      Allergic rhinitis Flare , use saline nasal rinses As needed     GERD (gastroesophageal reflux disease) GERD diet  follow up with GI as planned      Rexene Edison, NP 10/18/2017

## 2017-10-18 NOTE — Assessment & Plan Note (Signed)
Flare , use saline nasal rinses As needed

## 2017-10-18 NOTE — Patient Instructions (Addendum)
Zpack take as directed .  Prednisone taper over next week.  Mucinex DM Twice daily As needed  Cough/congestion  Use albuterol neb every 6hr as needed.  Follow up with Dr. Elsworth Soho  In 3 months and As needed   Please contact office for sooner follow up if symptoms do not improve or worsen or seek emergency care

## 2017-10-18 NOTE — Progress Notes (Signed)
Denies allergies to eggs or soy products. Denies complication of anesthesia or sedation. Denies use of weight loss medication. Denies use of O2.   Emmi instructions declined.  

## 2017-10-23 DIAGNOSIS — J45909 Unspecified asthma, uncomplicated: Secondary | ICD-10-CM | POA: Diagnosis not present

## 2017-10-29 ENCOUNTER — Ambulatory Visit (HOSPITAL_COMMUNITY)
Admission: RE | Admit: 2017-10-29 | Discharge: 2017-10-29 | Disposition: A | Discharge status: Home / Self Care | Payer: Medicare Other | Source: Ambulatory Visit | Attending: Internal Medicine | Admitting: Internal Medicine

## 2017-10-29 ENCOUNTER — Ambulatory Visit (HOSPITAL_COMMUNITY): Payer: Medicare Other | Admitting: Certified Registered Nurse Anesthetist

## 2017-10-29 ENCOUNTER — Encounter (HOSPITAL_COMMUNITY)
Admission: RE | Disposition: A | Discharge status: Home / Self Care | Payer: Self-pay | Source: Ambulatory Visit | Attending: Internal Medicine

## 2017-10-29 ENCOUNTER — Other Ambulatory Visit: Payer: Self-pay

## 2017-10-29 ENCOUNTER — Encounter (HOSPITAL_COMMUNITY): Payer: Self-pay | Admitting: Emergency Medicine

## 2017-10-29 DIAGNOSIS — Z79891 Long term (current) use of opiate analgesic: Secondary | ICD-10-CM | POA: Insufficient documentation

## 2017-10-29 DIAGNOSIS — F418 Other specified anxiety disorders: Secondary | ICD-10-CM | POA: Insufficient documentation

## 2017-10-29 DIAGNOSIS — E669 Obesity, unspecified: Secondary | ICD-10-CM | POA: Diagnosis not present

## 2017-10-29 DIAGNOSIS — Z79899 Other long term (current) drug therapy: Secondary | ICD-10-CM | POA: Diagnosis not present

## 2017-10-29 DIAGNOSIS — I509 Heart failure, unspecified: Secondary | ICD-10-CM | POA: Insufficient documentation

## 2017-10-29 DIAGNOSIS — K222 Esophageal obstruction: Secondary | ICD-10-CM

## 2017-10-29 DIAGNOSIS — E119 Type 2 diabetes mellitus without complications: Secondary | ICD-10-CM | POA: Diagnosis not present

## 2017-10-29 DIAGNOSIS — K219 Gastro-esophageal reflux disease without esophagitis: Secondary | ICD-10-CM | POA: Insufficient documentation

## 2017-10-29 DIAGNOSIS — Z7982 Long term (current) use of aspirin: Secondary | ICD-10-CM | POA: Diagnosis not present

## 2017-10-29 DIAGNOSIS — I252 Old myocardial infarction: Secondary | ICD-10-CM | POA: Insufficient documentation

## 2017-10-29 DIAGNOSIS — R1310 Dysphagia, unspecified: Secondary | ICD-10-CM | POA: Insufficient documentation

## 2017-10-29 DIAGNOSIS — J309 Allergic rhinitis, unspecified: Secondary | ICD-10-CM | POA: Diagnosis not present

## 2017-10-29 DIAGNOSIS — M797 Fibromyalgia: Secondary | ICD-10-CM | POA: Insufficient documentation

## 2017-10-29 DIAGNOSIS — I110 Hypertensive heart disease with heart failure: Secondary | ICD-10-CM | POA: Diagnosis not present

## 2017-10-29 DIAGNOSIS — M1990 Unspecified osteoarthritis, unspecified site: Secondary | ICD-10-CM | POA: Diagnosis not present

## 2017-10-29 DIAGNOSIS — K31819 Angiodysplasia of stomach and duodenum without bleeding: Secondary | ICD-10-CM | POA: Diagnosis not present

## 2017-10-29 DIAGNOSIS — I2510 Atherosclerotic heart disease of native coronary artery without angina pectoris: Secondary | ICD-10-CM | POA: Diagnosis not present

## 2017-10-29 DIAGNOSIS — K449 Diaphragmatic hernia without obstruction or gangrene: Secondary | ICD-10-CM | POA: Insufficient documentation

## 2017-10-29 DIAGNOSIS — I4891 Unspecified atrial fibrillation: Secondary | ICD-10-CM | POA: Diagnosis not present

## 2017-10-29 HISTORY — PX: SAVORY DILATION: SHX5439

## 2017-10-29 HISTORY — PX: ESOPHAGOGASTRODUODENOSCOPY (EGD) WITH PROPOFOL: SHX5813

## 2017-10-29 HISTORY — DX: Malignant (primary) neoplasm, unspecified: C80.1

## 2017-10-29 SURGERY — ESOPHAGOGASTRODUODENOSCOPY (EGD) WITH PROPOFOL
Anesthesia: Monitor Anesthesia Care

## 2017-10-29 MED ORDER — SODIUM CHLORIDE 0.9 % IV SOLN: INTRAVENOUS | Status: DC

## 2017-10-29 MED ORDER — PROPOFOL 10 MG/ML IV BOLUS
INTRAVENOUS | Status: AC
  Filled 2017-10-29: qty 40

## 2017-10-29 MED ORDER — PROPOFOL 10 MG/ML IV BOLUS
INTRAVENOUS | Status: DC | PRN
  Administered 2017-10-29: 20 mg via INTRAVENOUS
  Administered 2017-10-29: 30 mg via INTRAVENOUS
  Administered 2017-10-29: 10 mg via INTRAVENOUS
  Administered 2017-10-29: 30 mg via INTRAVENOUS
  Administered 2017-10-29: 10 mg via INTRAVENOUS
  Administered 2017-10-29: 20 mg via INTRAVENOUS
  Administered 2017-10-29: 10 mg via INTRAVENOUS
  Administered 2017-10-29: 20 mg via INTRAVENOUS
  Administered 2017-10-29: 30 mg via INTRAVENOUS
  Administered 2017-10-29: 20 mg via INTRAVENOUS
  Administered 2017-10-29: 10 mg via INTRAVENOUS

## 2017-10-29 MED ORDER — LACTATED RINGERS IV SOLN
INTRAVENOUS | Status: DC | PRN
  Administered 2017-10-29: 09:00:00 via INTRAVENOUS

## 2017-10-29 SURGICAL SUPPLY — 15 items

## 2017-10-29 NOTE — Consult Note (Signed)
HPI: Patient is a 61 year old female known to Dr. Henrene Pastor for history of chronic GERD and dysphagia. EGD April 2015 revealed a distal esophageal stricture. Dilation was performed with a 54 [place] Maloney dilator. Post dilation patient swallowed better for the next several months. She came back in February 2017 with uncontrolled GERD symptoms and dysphagia to liquids which was felt to be more related to dysmotility.    Patient is referred back by Rexene Edison,  NP for evaluation of abnormal barium swallow done in Feb with findings of a persistent/recurrent stricture at the GE junction. Chopped meats and bread tend to get stuck. Carbonated drinks cause her the most problem but unfortunately she needs them to take her daily meds. She has reflux / regurgitation, especially at night. On Nexium BID, sleeps propped on pillows and takes Carafate TID. Hasn't been able to lose weight, it is up 6 pounds since last visit.   Last colonoscopy was in 2012 with [facility]. Hyperplastic polyps removed.       Past Medical History:  Diagnosis Date  . Anemia    "chronic"  . Angina   . Anxiety and depression   . Atrial fibrillation ([place])    h/o "AF w/frequent PVCs"  . Chronic back pain greater than 3 months duration    on chronic narcotics, treated at pain clinic  . Colon polyps    hyperplastic  . Coronary artery disease    Arrythmia, orthostatic hypotension, HLD, HTN; sees Dr. Einar Gip  . Difficult intubation    "TMJ & woke up when they were still cutting on me"  . Dysrhythmia    sees Dr. Einar Gip and a cardiologist at [facility]  . Esophageal stricture   . Fatty liver   . Fibroids   . Fibromyalgia    "in my legs"  . GERD (gastroesophageal reflux disease)    hx hiatal hernia, stricture and gastric ulcer  . Headache(784.0)   . Heart murmur   . Hiatal hernia   . History of loop recorder   . History of migraines    "dx'd when I was in my teens"  . Hyperlipemia   . Hypertension    . Mental disorder   . Mild episode of recurrent major depressive disorder ([place]) 12/06/2015  . Myocardial infarction [facility]) 1980's & 1990;   sees Dr. Einar Gip  . Pneumonia   . PONV (postoperative nausea and vomiting)   . Recurrent upper respiratory infection (URI)   . Shortness of breath 11/20/11   "all the time", sees pulmonlogy, ? asthma  . Stenotic cervical os   . Stomach ulcer    "3 small; found in 05/2011"  . TMJ (dislocation of temporomandibular joint)   . Tuberculosis    + TB SKIN TEST  . Type 2 diabetes mellitus without complication, without long-term current use of insulin ([place]) 12/06/2015    Patient's surgical history, family medical history, social history, medications and allergies were all reviewed in Epic    Physical Exam: BP 110/80   Pulse 72   Ht 5\' 5"  (1.651 m)   Wt 225 lb 4 oz (102.2 kg)   BMI 37.48 kg/m   GENERAL: overweight female in NAD PSYCH: :Pleasant, cooperative, normal affect EENT:  conjunctiva pink, mucous membranes moist, neck supple without masses CARDIAC:  RRR, no murmur heard, no peripheral edema PULM: Normal respiratory effort, lungs CTA bilaterally, no wheezing ABDOMEN:  soft, nontender, nondistended, no obvious masses, no hepatomegaly,  normal bowel sounds SKIN:  turgor, no lesions seen Musculoskeletal:  Normal muscle tone, normal strength NEURO: Alert and oriented x 3, no focal neurologic deficits    ASSESSMENT and PLAN:   Pleasant 61 year old female with chronic GERD / chronic solid food and liquid dysphagia.  Suspect a component of esophageal dysmotility though barium swallow does show a persistent / recurrent stricture at GEJ. Similar findings on barium swallow in 2015 for which patient had EGD with maloney dilation  giving her a 6 month reprieve in symptoms.  - I will discuss case with patient's primary GI, Dr. Henrene Pastor. If he feels repeat dilation will be helpful then we will call patient to arrange. Advised patient in  the interim to eat small bites, chew well with liquids in between bites to avoid food impaction. -recommended wedge pillos -continue anti-reflux measures.  -continue BID PPI  GI ATTENDING  Patient being evaluated since comprehensive office evaluation as outlined above. Personally seen and examined bedside. No significant interval change. Now for upper endoscopy with esophageal dilation for distal esophageal stricture in hopes of improving problems with dysphagia. She may have a component of dysmotility as well but responded previously to stricture dilation.The nature of the procedure, as well as the risks, benefits, and alternatives were carefully and thoroughly reviewed with the patient. Ample time for discussion and questions allowed. The patient understood, was satisfied, and agreed to proceed.  Docia Chuck. Geri Seminole., M.D. [facility] Division of Gastroenterology

## 2017-10-29 NOTE — Anesthesia Postprocedure Evaluation (Signed)
Anesthesia Post Note  Patient: Shelby Anderson  Procedure(s) Performed: ESOPHAGOGASTRODUODENOSCOPY (EGD) WITH PROPOFOL (N/A ) SAVORY DILATION (N/A )     Patient location during evaluation: Endoscopy Anesthesia Type: MAC Level of consciousness: awake and alert Pain management: pain level controlled Vital Signs Assessment: post-procedure vital signs reviewed and stable Respiratory status: spontaneous breathing, nonlabored ventilation, respiratory function stable and patient connected to nasal cannula oxygen Cardiovascular status: stable and blood pressure returned to baseline Postop Assessment: no apparent nausea or vomiting Anesthetic complications: no Comments: No antiemetics given due to MAC procedure, and no patient complaint of nausea/vomiting.     Last Vitals:  Vitals:   10/29/17 1000 10/29/17 1010  BP: (!) 93/55 (!) 116/56  Pulse: (!) 58 (!) 57  Resp: 14 12  Temp:    SpO2: 98% 97%    Last Pain:  Vitals:   10/29/17 1010  TempSrc:   PainSc: [place]

## 2017-10-29 NOTE — Op Note (Signed)
[facility] Patient Name: Shelby Anderson Procedure Date: 10/29/2017 MRN: [id number] Attending MD: Docia Chuck. Henrene Pastor , MD Date of Birth: 05/07/1956 CSN: [id number] Age: 61 Admit Type: Outpatient Procedure:                Upper GI endoscopy, with [facility] dilation of the                            esophagus-72f Indications:              Dysphagia Providers:                Docia Chuck. Henrene Pastor, MD, Laverta Baltimore RN, RN, Tinnie Gens, Technician, Stanton Alday CRNA, CRNA Referring MD:              Medicines:                Monitored Anesthesia Care Complications:            No immediate complications. Estimated Blood Loss:     Estimated blood loss: none. Procedure:                Pre-Anesthesia Assessment:                           - Prior to the procedure, a History and Physical                            was performed, and patient medications and                            allergies were reviewed. The patient's tolerance of                            previous anesthesia was also reviewed. The risks                            and benefits of the procedure and the sedation                            options and risks were discussed with the patient.                            All questions were answered, and informed consent                            was obtained. Prior Anticoagulants: The patient has                            taken no previous anticoagulant or antiplatelet                            agents. ASA Grade Assessment: III - A patient with  severe systemic disease. After reviewing the risks                            and benefits, the patient was deemed in                            satisfactory condition to undergo the procedure.                           After obtaining informed consent, the endoscope was                            passed under direct vision. Throughout the   procedure, the patient's blood pressure, pulse, and                            oxygen saturations were monitored continuously. The                            [id number]) scope was introduced through the                            mouth, and advanced to the second part of duodenum.                            The upper GI endoscopy was accomplished without                            difficulty. The patient tolerated the procedure                            well. Scope In: Scope Out: Findings:      One mild benign-appearing, intrinsic stenosis was found 35 cm from the       incisors. This measured 1.5 cm (inner diameter). The scope was       withdrawn. Dilation was performed with a Maloney dilator with no       resistance at 90 Fr. No significant resistance. No heme. Tolerated well.      The exam of the esophagus was otherwise normal.      The stomach revealed a small sliding hiatal hernia. In addition there       was nonbleeding linear antral erythema suggestive of GAVE (watermelon       stomach). The exam was otherwise normal.      The examined duodenum was normal.      The cardia and gastric fundus were normal on retroflexion. Impression:               - Benign-appearing esophageal stenosis. Dilated.                           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                           - Normal examined duodenum.                           -  No specimens collected. Moderate Sedation:      none Recommendation:           - Patient has a contact number available for                            emergencies. The signs and symptoms of potential                            delayed complications were discussed with the                            patient. Return to normal activities tomorrow.                            Written discharge instructions were provided to the                            patient.                           - Post-dilation diet.                            - Continue present medications.                           - Return to the care of your primary provider. GI                            follow-up as needed Procedure Code(s):        --- Professional ---                           [phone], Esophagogastroduodenoscopy, flexible,                            transoral; diagnostic, including collection of                            specimen(s) by brushing or washing, when performed                            (separate procedure)                           43450, Dilation of esophagus, by unguided sound or                            bougie, single or multiple passes Diagnosis Code(s):        --- Professional ---                           K22.2, Esophageal obstruction                           R13.10, Dysphagia, unspecified CPT copyright 2016 American Medical Association. All rights reserved. The  codes documented in this report are preliminary and upon coder review may  be revised to meet current compliance requirements. Docia Chuck. Henrene Pastor, MD 10/29/2017 9:56:44 AM This report has been signed electronically. Number of Addenda: 0

## 2017-10-29 NOTE — Transfer of Care (Signed)
Immediate Anesthesia Transfer of Care Note  Patient: Shelby Anderson  Procedure(s) Performed: ESOPHAGOGASTRODUODENOSCOPY (EGD) WITH PROPOFOL (N/A ) SAVORY DILATION (N/A )  Patient Location: PACU  Anesthesia Type:MAC  Level of Consciousness: sedated  Airway & Oxygen Therapy: Patient Spontanous Breathing and Patient connected to nasal cannula oxygen  Post-op Assessment: Report given to RN and Post -op Vital signs reviewed and stable  Post vital signs: Reviewed and stable  Last Vitals:  Vitals:   10/29/17 0836  BP: 139/74  Pulse: (!) 56  Resp: 14  Temp: 36.9 C  SpO2: 93%    Last Pain:  Vitals:   10/29/17 0836  TempSrc: Oral  PainSc: 6          Complications: No apparent anesthesia complications

## 2017-10-29 NOTE — Discharge Instructions (Signed)
YOU HAD AN ENDOSCOPIC PROCEDURE TODAY: Refer to the procedure report and other information in the discharge instructions given to you for any specific questions about what was found during the examination. If this information does not answer your questions, please call [place] office at [phone] to clarify.  ° °YOU SHOULD EXPECT: Some feelings of bloating in the abdomen. Passage of more gas than usual. Walking can help get rid of the air that was put into your GI tract during the procedure and reduce the bloating. If you had a lower endoscopy (such as a colonoscopy or flexible sigmoidoscopy) you may notice spotting of blood in your stool or on the toilet paper. Some abdominal soreness may be present for a day or two, also. ° °DIET: Your first meal following the procedure should be a light meal and then it is ok to progress to your normal diet. A half-sandwich or bowl of soup is an example of a good first meal. Heavy or fried foods are harder to digest and may make you feel nauseous or bloated. Drink plenty of fluids but you should avoid alcoholic beverages for 24 hours. If you had a esophageal dilation, please see attached instructions for diet.   ° °ACTIVITY: Your care partner should take you home directly after the procedure. You should plan to take it easy, moving slowly for the rest of the day. You can resume normal activity the day after the procedure however YOU SHOULD NOT DRIVE, use power tools, machinery or perform tasks that involve climbing or major physical exertion for 24 hours (because of the sedation medicines used during the test).  ° °SYMPTOMS TO REPORT IMMEDIATELY: °A gastroenterologist can be reached at any hour. Please call [phone]  for any of the following symptoms:  °Following lower endoscopy (colonoscopy, flexible sigmoidoscopy) °Excessive amounts of blood in the stool  °Significant tenderness, worsening of abdominal pains  °Swelling of the abdomen that is new, acute  °Fever of 100° or  higher  °Following upper endoscopy (EGD, EUS, ERCP, esophageal dilation) °Vomiting of blood or coffee ground material  °New, significant abdominal pain  °New, significant chest pain or pain under the shoulder blades  °Painful or persistently difficult swallowing  °New shortness of breath  °Black, tarry-looking or red, bloody stools ° °FOLLOW UP:  °If any biopsies were taken you will be contacted by phone or by letter within the next 1-3 weeks. Call [phone]  if you have not heard about the biopsies in 3 weeks.  °Please also call with any specific questions about appointments or follow up tests. ° °

## 2017-10-29 NOTE — Anesthesia Preprocedure Evaluation (Signed)
Anesthesia Evaluation  Patient identified by MRN, date of birth, ID band Patient awake    Reviewed: Allergy & Precautions, NPO status , Patient's Chart, lab work & pertinent test results  History of Anesthesia Complications (+) PONV, DIFFICULT AIRWAY and history of anesthetic complications  Airway Mallampati: II  TM Distance: <3 FB Neck ROM: Full    Dental  (+) Dental Advisory Given, Upper Dentures, Chipped, Poor Dentition   Pulmonary shortness of breath, asthma , Recent URI , Residual Cough,    Pulmonary exam normal breath sounds clear to auscultation       Cardiovascular hypertension, Pt. on home beta blockers and Pt. on medications + angina + CAD, + Past MI and +CHF  Normal cardiovascular exam+ dysrhythmias Atrial Fibrillation  Rhythm:Regular Rate:Normal     Neuro/Psych  Headaches, PSYCHIATRIC DISORDERS Anxiety Depression  Neuromuscular disease    GI/Hepatic Neg liver ROS, hiatal hernia, PUD, GERD  Medicated and Poorly Controlled,  Endo/Other  diabetes, Type 2Obesity   Renal/GU negative Renal ROS     Musculoskeletal  (+) Arthritis , Fibromyalgia -, narcotic dependent  Abdominal   Peds  Hematology  (+) Blood dyscrasia, anemia ,   Anesthesia Other Findings Day of surgery medications reviewed with the patient.  Reproductive/Obstetrics                             Anesthesia Physical Anesthesia Plan  ASA: III  Anesthesia Plan: MAC   Post-op Pain Management:    Induction: Intravenous  PONV Risk Score and Plan: 3 and Midazolam, Propofol infusion, Treatment may vary due to age or medical condition and Ondansetron  Airway Management Planned: Nasal Cannula  Additional Equipment:   Intra-op Plan:   Post-operative Plan:   Informed Consent: I have reviewed the patients History and Physical, chart, labs and discussed the procedure including the risks, benefits and alternatives for the  proposed anesthesia with the patient or authorized representative who has indicated his/her understanding and acceptance.   Dental advisory given  Plan Discussed with: CRNA and Anesthesiologist  Anesthesia Plan Comments: (Discussed risks/benefits/alternatives to MAC sedation including need for ventilatory support, hypotension, need for conversion to general anesthesia.  All patient questions answered.  Patient/guardian wishes to proceed.)        Anesthesia Quick Evaluation

## 2017-10-31 ENCOUNTER — Encounter (HOSPITAL_COMMUNITY): Payer: Self-pay | Admitting: Internal Medicine

## 2017-10-31 DIAGNOSIS — J45909 Unspecified asthma, uncomplicated: Secondary | ICD-10-CM | POA: Diagnosis not present

## 2017-10-31 DIAGNOSIS — J4540 Moderate persistent asthma, uncomplicated: Secondary | ICD-10-CM | POA: Diagnosis not present

## 2017-10-31 DIAGNOSIS — J189 Pneumonia, unspecified organism: Secondary | ICD-10-CM | POA: Diagnosis not present

## 2017-10-31 DIAGNOSIS — R0902 Hypoxemia: Secondary | ICD-10-CM | POA: Diagnosis not present

## 2017-11-01 ENCOUNTER — Ambulatory Visit: Payer: Medicare Other | Admitting: Adult Health

## 2017-11-01 NOTE — Progress Notes (Signed)
Reviewed & agree with plan  

## 2017-11-22 ENCOUNTER — Encounter: Payer: Self-pay | Admitting: Podiatry

## 2017-11-22 ENCOUNTER — Ambulatory Visit: Payer: Medicare Other | Admitting: Podiatry

## 2017-11-22 VITALS — BP 134/81 | HR 73 | Ht 66.0 in | Wt 221.0 lb

## 2017-11-22 DIAGNOSIS — E1151 Type 2 diabetes mellitus with diabetic peripheral angiopathy without gangrene: Secondary | ICD-10-CM

## 2017-11-22 DIAGNOSIS — B351 Tinea unguium: Secondary | ICD-10-CM | POA: Diagnosis not present

## 2017-11-22 DIAGNOSIS — J45909 Unspecified asthma, uncomplicated: Secondary | ICD-10-CM | POA: Diagnosis not present

## 2017-11-22 NOTE — Progress Notes (Signed)
Subjective:    Patient ID: Shelby Anderson, female    DOB: [birth year], 61 y.o.   MRN: [id number]  HPI  Chief Complaint  Patient presents with  . Diabetes    last A1C 7.3 / diabetic foot exam  . Nail Problem    bilateral elngated thickened toenails/ PCP advised apple cider vinegar soaks, but this seemed to make things worse.      61 y.o. female presents with the above complaint.  Diabetes with last A1c of 7.3.  Reports elongated thickened toenails bilaterally denies cramping in legs and thighs.  Denies numbness and tingling in feet. Past Medical History:  Diagnosis Date  . Allergy   . Anemia    "chronic"  . Angina   . Anxiety and depression   . Arthritis   . Asthma   . Atrial fibrillation ([place])    h/o "AF w/frequent PVCs"  . Cancer (HCC)    hx of skin cancer   . CHF (congestive heart failure) ([place])   . Chronic back pain greater than 3 months duration    on chronic narcotics, treated at pain clinic  . Colon polyps    hyperplastic  . Coronary artery disease    Arrythmia, orthostatic hypotension, HLD, HTN; sees Dr. Einar Gip  . Difficult intubation    "TMJ & woke up when they were still cutting on me"  . Dysrhythmia    sees Dr. Einar Gip and a cardiologist at [facility]  . Esophageal stricture   . Fatty liver   . Fibroids   . Fibromyalgia    "in my legs"  . GERD (gastroesophageal reflux disease)    hx hiatal hernia, stricture and gastric ulcer  . Headache(784.0)   . Heart murmur   . Hiatal hernia   . History of loop recorder   . History of migraines    "dx'd when I was in my teens"  . Hyperlipemia   . Hypertension   . Mental disorder   . Mild episode of recurrent major depressive disorder ([place]) 12/06/2015  . Myocardial infarction [facility]) 1980's & 1990;   sees Dr. Einar Gip  . Pneumonia   . PONV (postoperative nausea and vomiting)   . Recurrent upper respiratory infection (URI)   . Shortness of breath 11/20/11   "all the time", sees pulmonlogy, ? asthma  . Stenotic  cervical os   . Stomach ulcer    "3 small; found in 05/2011"  . TMJ (dislocation of temporomandibular joint)   . Tuberculosis    + TB SKIN TEST  . Type 2 diabetes mellitus without complication, without long-term current use of insulin ([place]) 12/06/2015   not on meds    Past Surgical History:  Procedure Laterality Date  . ACHILLES TENDON REPAIR  1970's   left ankle  . ARTHROSCOPIC REPAIR ACL     left knee cap  . BREAST BIOPSY Right 04/08/2013  . CARDIAC CATHETERIZATION     loop recorder  . CARPAL TUNNEL RELEASE  unknown   left hand  . ESOPHAGOGASTRODUODENOSCOPY (EGD) WITH PROPOFOL N/A 10/29/2017   Procedure: ESOPHAGOGASTRODUODENOSCOPY (EGD) WITH PROPOFOL;  Surgeon: Irene Shipper, MD;  Location: [facility] ENDOSCOPY;  Service: Endoscopy;  Laterality: N/A;  . LOOP RECORDER IMPLANT    . post ganglionectomy  1970's   "for migraine headaches"  . pouch string  [phone]   "did this 3 times (once w/each pregnancy)"  . SAVORY DILATION N/A 10/29/2017   Procedure: SAVORY DILATION;  Surgeon: Irene Shipper, MD;  Location: [facility];  Service: Endoscopy;  Laterality: N/A;  . TOTAL KNEE ARTHROPLASTY Left 09/25/2016   Procedure: LEFT TOTAL KNEE ARTHROPLASTY;  Surgeon: Paralee Cancel, MD;  Location: [facility] ORS;  Service: Orthopedics;  Laterality: Left;  . TUBAL LIGATION  1980's    Current Outpatient Medications:  .  albuterol (PROAIR HFA) 108 (90 Base) MCG/ACT inhaler, Inhale 2 puffs into the lungs every 4 hours as needed for shortness of breath, Disp: 8 g, Rfl: 3 .  albuterol (PROVENTIL) (2.5 MG/3ML) 0.083% nebulizer solution, Take 3 mLs (2.5 mg total) by nebulization every 6 (six) hours as needed for wheezing or shortness of breath (J45.40)., Disp: 75 mL, Rfl: 3 .  aspirin EC 81 MG tablet, Take 81 mg at bedtime by mouth. , Disp: , Rfl:  .  atorvastatin (LIPITOR) 40 MG tablet, TAKE 1 TABLET BY MOUTH EVERY DAY (Patient taking differently: TAKE 1 TABLET BY MOUTH EVERY DAY AT NIGHT), Disp: 90 tablet, Rfl: 1 .   budesonide-formoterol (SYMBICORT) 80-4.5 MCG/ACT inhaler, Take 2 puffs first thing in am and then another 2 puffs about 12 hours later., Disp: 1 Inhaler, Rfl: 11 .  Calcium Carb-Cholecalciferol (CALCIUM 600+D) 600-800 MG-UNIT TABS, Take 1 tablet daily at 12 noon by mouth. , Disp: , Rfl:  .  CARAFATE 1 GM/10ML suspension, TAKE 10 MILLILITERS BY MOUTH 4 TIMES DAILY WITH MEALS AND AT BEDTIME, Disp: 420 mL, Rfl: 2 .  CINNAMON PO, Take 1,000 mg 2 (two) times daily by mouth., Disp: , Rfl:  .  clobetasol cream (TEMOVATE) 9.02 %, Apply 1 application 2 (two) times daily as needed topically (rash). , Disp: , Rfl:  .  Cyanocobalamin (VITAMIN B-12) 5000 MCG TBDP, Take 5,000 mcg by mouth daily. , Disp: , Rfl:  .  diphenhydrAMINE (BENADRYL) 25 MG tablet, Take 25 mg daily as needed by mouth (allergic reactions)., Disp: , Rfl:  .  docusate sodium (COLACE) 100 MG capsule, Take 1 capsule (100 mg total) by mouth 2 (two) times daily. (Patient taking differently: Take 100-200 mg by mouth daily as needed for mild constipation. ), Disp: 10 capsule, Rfl: 0 .  Eszopiclone (ESZOPICLONE) 3 MG TABS, Take 3 mg by mouth at bedtime as needed (insomnia). , Disp: , Rfl:  .  fluticasone (FLONASE) 50 MCG/ACT nasal spray, Place 2 sprays at bedtime as needed into both nostrils for allergies or rhinitis. , Disp: , Rfl:  .  hydrocortisone 2.5 % cream, Apply 1 application topically 2 (two) times daily as needed. dermatitis, Disp: , Rfl:  .  isosorbide mononitrate (IMDUR) 60 MG 24 hr tablet, TAKE 1 TABLET BY MOUTH EVERY DAY, Disp: 90 tablet, Rfl: 1 .  KLOR-CON M20 20 MEQ tablet, TAKE 1 TABLET BY MOUTH EVERY DAY, Disp: 30 tablet, Rfl: 5 .  L-Methylfolate (DEPLIN) 7.5 MG TABS, Take 7.5 mg by mouth daily with breakfast. , Disp: , Rfl:  .  LYRICA 75 MG capsule, Take 75 mg by mouth 3 (three) times daily. , Disp: , Rfl:  .  methocarbamol (ROBAXIN) 500 MG tablet, Take 1 tablet (500 mg total) by mouth every 6 (six) hours as needed for muscle spasms.  (Patient taking differently: Take 500 mg 3 (three) times daily as needed by mouth for muscle spasms. ), Disp: 60 tablet, Rfl: 0 .  metoprolol tartrate (LOPRESSOR) 50 MG tablet, TAKE 1 TABLET TWICE A DAY, Disp: 180 tablet, Rfl: 1 .  Misc Natural Products (GLUCOS-CHONDROIT-MSM COMPLEX PO), Take 1 tablet 3 (three) times daily by mouth. , Disp: , Rfl:  .  montelukast (SINGULAIR) 10 MG tablet, TAKE 1 TABLET (10 MG TOTAL) BY MOUTH AT BEDTIME., Disp: 90 tablet, Rfl: 3 .  Multiple Vitamin (MULITIVITAMIN WITH MINERALS) TABS, Take 1 tablet by mouth daily.  , Disp: , Rfl:  .  NEXIUM 40 MG capsule, TAKE ONE CAPSULE BY MOUTH TWICE A DAY, Disp: 60 capsule, Rfl: 6 .  nitroGLYCERIN (NITROSTAT) 0.3 MG SL tablet, Place 0.3 mg under the tongue every 5 (five) minutes as needed for chest pain (as needed)., Disp: , Rfl:  .  NONFORMULARY OR COMPOUNDED ITEM, Estradiol .02% 1 ML Prefilled Applicator Sig: apply vaginally twice a week #90 Day Supply with 4 refills, Disp: 1 each, Rfl: 4 .  ondansetron (ZOFRAN) 4 MG tablet, Take 1 tablet (4 mg total) by mouth every 8 (eight) hours as needed for nausea or vomiting., Disp: 20 tablet, Rfl: 0 .  Oxycodone HCl 20 MG TABS, Take 20 mg every 4 (four) hours as needed by mouth (pain). , Disp: , Rfl:  .  OXYGEN, Inhale 2 L into the lungs at bedtime., Disp: , Rfl:  .  polyethylene glycol (MIRALAX / GLYCOLAX) packet, Take 17 g by mouth 2 (two) times daily. (Patient taking differently: Take 17 g by mouth daily as needed for mild constipation. ), Disp: 14 each, Rfl: 0 .  predniSONE (DELTASONE) 10 MG tablet, 4 tabs for 2 days, then 3 tabs for 2 days, 2 tabs for 2 days, then 1 tab for 2 days, then stop, Disp: 20 tablet, Rfl: 0 .  progesterone (PROMETRIUM) 200 MG capsule, TAKE 1 CAPSULE BY MOUTH EVERY DAY FOR FIRST 12 DAYS OF MONTH, Disp: 36 capsule, Rfl: 2 .  promethazine (PHENERGAN) 25 MG tablet, TAKE 1 TABLET BY MOUTH AT BEDTIME AS NEEDED FOR NAUSEA AND VOMITING, Disp: 30 tablet, Rfl: 1 .   triamcinolone cream (KENALOG) 0.1 %, Apply 1 application daily as needed topically (rash)., Disp: , Rfl:   Allergies  Allergen Reactions  . Lodine [Etodolac] Anaphylaxis, Hives and Swelling  . Oxycontin [Oxycodone Hcl] Anaphylaxis    hives, trouble breathing, tongue swelling (Only Oxycontin) Tolerates plain oxycodone.  Marland Kitchen Penicillins Anaphylaxis    Told by a surgeon never to take it again. Has patient had a PCN reaction causing immediate rash, facial/tongue/throat swelling, SOB or lightheadedness with hypotension: Yes Has patient had a PCN reaction causing severe rash involving mucus membranes or skin necrosis: Unknown Has patient had a PCN reaction that required hospitalization: No Has patient had a PCN reaction occurring within the last 10 years: No If all of the above answers are "NO", then may proceed with Cephalosporin use.  . Aspirin Other (See Comments)    High-dose caused GI Bleeds  . Darvocet [Propoxyphene N-Acetaminophen] Hives  . Nitroglycerin     IV-BP drops dramatically  . Ultram [Tramadol Hcl] Hives  . Valium Other (See Comments)    Circulation problems. "Legs turned black".     Review of Systems  All other systems reviewed and are negative.      Objective:   Physical Exam Vitals:   11/22/17 1524  BP: 134/81  Pulse: 73   General AA&O x3. Normal mood and affect.  Vascular Dorsalis pedis and posterior tibial pulses  present 2+ and absent bilaterally  Capillary refill normal to all digits. Pedal hair growth normal.  Neurologic Epicritic sensation grossly present.  Dermatologic No open lesions. Interspaces clear of maceration. Nails x10 elongated and thickened yellow discoloration  Orthopedic: MMT 5/5 in dorsiflexion, plantarflexion, inversion, and eversion. Normal joint  ROM without pain or crepitus.      Assessment & Plan:  Evaluated and treated all questions answered  Diabetes with PAD, Onychomycosis -Educated on diabetic footcare. Diabetic risk level  1 -Nails x10 debrided sharply and manually with large nail nipper and rotary burr.  Procedure: Nail Debridement Rationale: Patient meets criteria for routine foot care due to Class B findings Type of Debridement: manual, sharp debridement. Instrumentation: Nail nipper, rotary burr. Number of Nails: 10

## 2017-11-22 NOTE — Patient Instructions (Signed)
Place 1/4 cup of epsom salts in a quart of warm tap water.  Submerge your foot or feet in the solution and soak for 20 minutes.  This soak should be done twice a day.  Next, remove your foot or feet from solution, blot dry the affected area. Apply ointment and cover if instructed by your doctor.   IF YOUR SKIN BECOMES IRRITATED WHILE USING THESE INSTRUCTIONS, IT IS OKAY TO SWITCH TO  WHITE VINEGAR AND WATER.  As another alternative soak, you may use antibacterial soap and water.  Monitor for any signs/symptoms of infection. Call the office immediately if any occur or go directly to the emergency room. Call with any questions/concerns.  Fungal Nail Infection Fungal nail infection is a common fungal infection of the toenails or fingernails. This condition affects toenails more often than fingernails. More than one nail may be infected. The condition can be passed from person to person (is contagious). What are the causes? This condition is caused by a fungus. Several types of funguses can cause the infection. These funguses are common in moist and warm areas. If your hands or feet come into contact with the fungus, it may get into a crack in your fingernail or toenail and cause the infection. What increases the risk? The following factors may make you more likely to develop this condition:  Being female.  Having diabetes.  Being of older age.  Living with someone who has the fungus.  Walking barefoot in areas where the fungus thrives, such as showers or locker rooms.  Having poor circulation.  Wearing shoes and socks that cause your feet to sweat.  Having athlete's foot.  Having a nail injury or history of a recent nail surgery.  Having psoriasis.  Having a weak body defense system (immune system).  What are the signs or symptoms? Symptoms of this condition include:  A pale spot on the nail.  Thickening of the nail.  A nail that becomes yellow or brown.  A brittle or ragged  nail edge.  A crumbling nail.  A nail that has lifted away from the nail bed.  How is this diagnosed? This condition is diagnosed with a physical exam. Your health care provider may take a scraping or clipping from your nail to test for the fungus. How is this treated? Mild infections do not need treatment. If you have significant nail changes, treatment may include:  Oral antifungal medicines. You may need to take the medicine for several weeks or several months, and you may not see the results for a long time. These medicines can cause side effects. Ask your health care provider what problems to watch for.  Antifungal nail polish and nail cream. These may be used along with oral antifungal medicines.  Laser treatment of the nail.  Surgery to remove the nail. This may be needed for the most severe infections.  Treatment takes a long time, and the infection may come back. Follow these instructions at home: Medicines  Take or apply over-the-counter and prescription medicines only as told by your health care provider.  Ask your health care provider about using over-the-counter mentholated ointment on your nails. Lifestyle   Do not share personal items, such as towels or nail clippers.  Trim your nails often.  Wash and dry your hands and feet every day.  Wear absorbent socks, and change your socks frequently.  Wear shoes that allow air to circulate, such as sandals or canvas tennis shoes. Throw out old shoes.  Wear rubber gloves if you are working with your hands in wet areas.  Do not walk barefoot in shower rooms or locker rooms.  Do not use a nail salon that does not use clean instruments.  Do not use artificial nails. General instructions  Keep all follow-up visits as told by your health care provider. This is important.  Use antifungal foot powder on your feet and in your shoes. Contact a health care provider if: Your infection is not getting better or it is  getting worse after several months. This information is not intended to replace advice given to you by your health care provider. Make sure you discuss any questions you have with your health care provider. Document Released: 11/16/2000 Document Revised: 04/26/2016 Document Reviewed: 05/23/2015 Elsevier Interactive Patient Education  2018 [employer].

## 2017-11-26 ENCOUNTER — Other Ambulatory Visit: Payer: Self-pay | Admitting: Internal Medicine

## 2017-11-28 ENCOUNTER — Telehealth: Payer: Self-pay | Admitting: Podiatry

## 2017-11-28 DIAGNOSIS — S9422XS Injury of deep peroneal nerve at ankle and foot level, left leg, sequela: Secondary | ICD-10-CM | POA: Diagnosis not present

## 2017-11-28 DIAGNOSIS — M47816 Spondylosis without myelopathy or radiculopathy, lumbar region: Secondary | ICD-10-CM | POA: Diagnosis not present

## 2017-11-28 DIAGNOSIS — G894 Chronic pain syndrome: Secondary | ICD-10-CM | POA: Diagnosis not present

## 2017-11-28 DIAGNOSIS — Z79891 Long term (current) use of opiate analgesic: Secondary | ICD-10-CM | POA: Diagnosis not present

## 2017-11-28 NOTE — Telephone Encounter (Signed)
Dr March Rummage, is there a medication you would like to prescribe?

## 2017-11-28 NOTE — Telephone Encounter (Signed)
I saw Dr. March Rummage last Friday and he stated he was going to send a prescription to my pharmacy which is [facility] on [address]. I checked with them after leaving there and they said nothing was ever called in. So I was following up on that. My number is [phone]. If someone would return my call I would appreciate it. Thank you.

## 2017-11-29 ENCOUNTER — Telehealth: Payer: Self-pay

## 2017-11-29 ENCOUNTER — Ambulatory Visit: Payer: Medicare Other

## 2017-11-29 VITALS — BP 130/80 | HR 67 | Ht 66.0 in | Wt 232.0 lb

## 2017-11-29 DIAGNOSIS — Z0000 Encounter for general adult medical examination without abnormal findings: Secondary | ICD-10-CM

## 2017-11-29 NOTE — Progress Notes (Addendum)
Subjective:   Shelby Anderson is a 61 y.o. female who presents for Medicare Annual (Subsequent) preventive examination.  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 Henrene Pastor does want to check another scope in 3 to 4 months to confirm.    Psychosocial; Keeps 2 grandchildren  4 in may and 7 in Feb  Goes back to [place] Jan 8 for custody   Parents not available per the patient Needed dental work last year and resources given SW from [facility] to [place] for medication support last year [facility] Pharmacist fup on meds and offered resources for assistance on 12/07/2016 States this was helpful and will send request for SW or pharmacy to go back out this year   Diet Diabetes- borderline; takes cinnamon ( A1c 7.7 in September from 5.9) Apt for up with Dr. [name] for Jan 10th States she had been on steroids and this is why her A1c was elevated  Obesity - chol / hdl ratio is 3;  GI is not sure why she is nauseated Nauseated after eating;  Does not recognize foods she can eat without nausea as it is variable     Exercise; During the week; keeps up home, runs errands and takes care of 2 young children  MI x2 reported; Strong family hx of HD Negative for tobacco and ETOh  Diabetic eye exam 07/2017  Colonoscopy 12/2010 to repeat in 10 years Endo 10/2017  Mammogram 08/2017 Dexa 03/2015 - was normal     Cardiac Risk Factors include: hypertension;diabetes mellitus;dyslipidemia;family history of premature cardiovascular disease;obesity (BMI >30kg/m2);sedentary lifestyle   Qualifies for Shingles Vaccine -not until she is 61 yo    Health Maintenance Due  Topic Date Due  . PAP SMEAR  07/07/2017   (reported she had a pap smear but it appears GYN apts were canceled. Left metric in for now)  Education provided  After hysterectomy or menopause, there are still many aspects that still need evaluation. The likelihood of cancers increase with age and problems are often first found through  annual exams. Whether you are postmenopausal or have had a hysterectomy, we check every year for palpable masses in the pelvis, and we assess vulvar and vaginal tissues (especially for women with a history of HPV). In post-menopausal women who have had nothing removed, we need to evaluate for postmenopausal bleeding, or evidence of masses on the uterus, cervix, and ovaries.  Pap smears may be discontinued for women who have had a hysterectomy with no significance of prior abnormal Pap smears, but should continue to have routine pelvic exams performed by their doctor.  In addition to the evaluations described earlier, we will address other issues that become more prevalent with age. These might include managing menopausal symptoms, urinary incontinence, prolapse of pelvic tissues and organs, and more. We'll also talk about the continuing necessity of practicing safe sex. (Did you know that the older population is one of the fastest growing age groups for sexually transmitted infections?) seeing a gynecologist after menopause or a hysterectomy is not just about the reproductive system.         Objective:     Vitals: BP 130/80   Pulse 67   Ht 5\' 6"  (1.676 m)   Wt 232 lb (105.2 kg)   SpO2 97%   BMI 37.45 kg/m   Body mass index is 37.45 kg/m.  Advanced Directives 11/29/2017 10/29/2017 01/21/2017 01/21/2017 12/28/2016 11/15/2016 11/08/2016  Does Patient Have a Medical Advance Directive? Yes Yes - Yes No  No No  Type of Advance Directive - Living will Healthcare Power of [place];Living will [place];Living will - - -  Does patient want to make changes to medical advance directive? - - - No - Patient declined - - -  Copy of [place] in Chart? - - - No - copy requested - - -  Would patient like information on creating a medical advance directive? - - - Yes (MAU/Ambulatory/Procedural Areas - Information given) Yes (Inpatient - patient defers creating a medical  advance directive at this time) No - Patient declined -  Pre-existing out of facility DNR order (yellow form or pink MOST form) - - - - - - -    Tobacco Social History   Tobacco Use  Smoking Status Never Smoker  Smokeless Tobacco Never Used     Counseling given: Yes   Clinical Intake:     Past Medical History:  Diagnosis Date  . Allergy   . Anemia    "chronic"  . Angina   . Anxiety and depression   . Arthritis   . Asthma   . Atrial fibrillation ([place])    h/o "AF w/frequent PVCs"  . Cancer (HCC)    hx of skin cancer   . CHF (congestive heart failure) ([place])   . Chronic back pain greater than 3 months duration    on chronic narcotics, treated at pain clinic  . Colon polyps    hyperplastic  . Coronary artery disease    Arrythmia, orthostatic hypotension, HLD, HTN; sees Dr. Einar Gip  . Difficult intubation    "TMJ & woke up when they were still cutting on me"  . Dysrhythmia    sees Dr. Einar Gip and a cardiologist at [facility]  . Esophageal stricture   . Fatty liver   . Fibroids   . Fibromyalgia    "in my legs"  . GERD (gastroesophageal reflux disease)    hx hiatal hernia, stricture and gastric ulcer  . Headache(784.0)   . Heart murmur   . Hiatal hernia   . History of loop recorder   . History of migraines    "dx'd when I was in my teens"  . Hyperlipemia   . Hypertension   . Mental disorder   . Mild episode of recurrent major depressive disorder ([place]) 12/06/2015  . Myocardial infarction [facility]) 1980's & 1990;   sees Dr. Einar Gip  . Pneumonia   . PONV (postoperative nausea and vomiting)   . Recurrent upper respiratory infection (URI)   . Shortness of breath 11/20/11   "all the time", sees pulmonlogy, ? asthma  . Stenotic cervical os   . Stomach ulcer    "3 small; found in 05/2011"  . TMJ (dislocation of temporomandibular joint)   . Tuberculosis    + TB SKIN TEST  . Type 2 diabetes mellitus without complication, without long-term current use of insulin ([place])  12/06/2015   not on meds    Past Surgical History:  Procedure Laterality Date  . ACHILLES TENDON REPAIR  1970's   left ankle  . ARTHROSCOPIC REPAIR ACL     left knee cap  . BREAST BIOPSY Right 04/08/2013  . CARDIAC CATHETERIZATION     loop recorder  . CARPAL TUNNEL RELEASE  unknown   left hand  . ESOPHAGOGASTRODUODENOSCOPY (EGD) WITH PROPOFOL N/A 10/29/2017   Procedure: ESOPHAGOGASTRODUODENOSCOPY (EGD) WITH PROPOFOL;  Surgeon: Irene Shipper, MD;  Location: [facility] ENDOSCOPY;  Service: Endoscopy;  Laterality: N/A;  . LOOP  RECORDER IMPLANT    . post ganglionectomy  1970's   "for migraine headaches"  . pouch string  [phone]   "did this 3 times (once w/each pregnancy)"  . SAVORY DILATION N/A 10/29/2017   Procedure: SAVORY DILATION;  Surgeon: Irene Shipper, MD;  Location: [facility];  Service: Endoscopy;  Laterality: N/A;  . TOTAL KNEE ARTHROPLASTY Left 09/25/2016   Procedure: LEFT TOTAL KNEE ARTHROPLASTY;  Surgeon: Paralee Cancel, MD;  Location: [facility] ORS;  Service: Orthopedics;  Laterality: Left;  . TUBAL LIGATION  1980's   Family History  Problem Relation Age of Onset  . Malignant hyperthermia Father   . Hypertension Father   . Heart disease Father   . Diabetes Father   . Cancer Father        skin  . Hypertension Mother   . Heart disease Mother   . Cancer Sister        CERVICAL  . Hypertension Sister   . Cancer Brother        MELANOMA  . Heart disease Maternal Grandmother   . Heart disease Maternal Grandfather   . Cancer Paternal Grandmother        ?   Marland Kitchen Heart disease Paternal Grandmother   . Heart disease Paternal Grandfather   . Cancer Brother        LUNG  . Diabetes Sister   . Hypertension Sister   . Heart disease Sister   . Cancer Sister   . Cancer Brother   . Anesthesia problems Neg Hx   . Hypotension Neg Hx   . Pseudochol deficiency Neg Hx   . Colon cancer Neg Hx   . Esophageal cancer Neg Hx   . Pancreatic cancer Neg Hx   . Rectal cancer Neg Hx   . Stomach  cancer Neg Hx    Social History   Socioeconomic History  . Marital status: Married    Spouse name: Not on file  . Number of children: 3  . Years of education: Not on file  . Highest education level: Not on file  Social Needs  . Financial resource strain: Not on file  . Food insecurity - worry: Not on file  . Food insecurity - inability: Not on file  . Transportation needs - medical: Not on file  . Transportation needs - non-medical: Not on file  Occupational History  . Occupation: Retired Therapist, sports  Tobacco Use  . Smoking status: Never Smoker  . Smokeless tobacco: Never Used  Substance and Sexual Activity  . Alcohol use: No    Alcohol/week: 0.0 oz  . Drug use: No  . Sexual activity: Yes    Birth control/protection: Surgical  Other Topics Concern  . Not on file  Social History Narrative  . Not on file    Outpatient Encounter Medications as of 11/29/2017  Medication Sig  . albuterol (PROAIR HFA) 108 (90 Base) MCG/ACT inhaler Inhale 2 puffs into the lungs every 4 hours as needed for shortness of breath  . albuterol (PROVENTIL) (2.5 MG/3ML) 0.083% nebulizer solution Take 3 mLs (2.5 mg total) by nebulization every 6 (six) hours as needed for wheezing or shortness of breath (J45.40).  Marland Kitchen aspirin EC 81 MG tablet Take 81 mg at bedtime by mouth.   Marland Kitchen atorvastatin (LIPITOR) 40 MG tablet TAKE 1 TABLET BY MOUTH EVERY DAY (Patient taking differently: TAKE 1 TABLET BY MOUTH EVERY DAY AT NIGHT)  . budesonide-formoterol (SYMBICORT) 80-4.5 MCG/ACT inhaler Take 2 puffs first thing in am and then another  2 puffs about 12 hours later.  . Calcium Carb-Cholecalciferol (CALCIUM 600+D) 600-800 MG-UNIT TABS Take 1 tablet daily at 12 noon by mouth.   Marland Kitchen CARAFATE 1 GM/10ML suspension TAKE 10 MILLILITERS BY MOUTH 4 TIMES DAILY WITH MEALS AND AT BEDTIME  . CINNAMON PO Take 1,000 mg 2 (two) times daily by mouth.  . clobetasol cream (TEMOVATE) 2.59 % Apply 1 application 2 (two) times daily as needed topically  (rash).   . Cyanocobalamin (VITAMIN B-12) 5000 MCG TBDP Take 5,000 mcg by mouth daily.   . diphenhydrAMINE (BENADRYL) 25 MG tablet Take 25 mg daily as needed by mouth (allergic reactions).  . docusate sodium (COLACE) 100 MG capsule Take 1 capsule (100 mg total) by mouth 2 (two) times daily. (Patient taking differently: Take 100-200 mg by mouth daily as needed for mild constipation. )  . Eszopiclone (ESZOPICLONE) 3 MG TABS Take 3 mg by mouth at bedtime as needed (insomnia).   . fluticasone (FLONASE) 50 MCG/ACT nasal spray Place 2 sprays at bedtime as needed into both nostrils for allergies or rhinitis.   . hydrocortisone 2.5 % cream Apply 1 application topically 2 (two) times daily as needed. dermatitis  . isosorbide mononitrate (IMDUR) 60 MG 24 hr tablet TAKE 1 TABLET BY MOUTH EVERY DAY  . KLOR-CON M20 20 MEQ tablet TAKE 1 TABLET BY MOUTH EVERY DAY  . L-Methylfolate (DEPLIN) 7.5 MG TABS Take 7.5 mg by mouth daily with breakfast.   . LYRICA 75 MG capsule Take 75 mg by mouth 3 (three) times daily.   . methocarbamol (ROBAXIN) 500 MG tablet Take 1 tablet (500 mg total) by mouth every 6 (six) hours as needed for muscle spasms. (Patient taking differently: Take 500 mg 3 (three) times daily as needed by mouth for muscle spasms. )  . metoprolol tartrate (LOPRESSOR) 50 MG tablet TAKE 1 TABLET TWICE A DAY  . Misc Natural Products (GLUCOS-CHONDROIT-MSM COMPLEX PO) Take 1 tablet 3 (three) times daily by mouth.   . montelukast (SINGULAIR) 10 MG tablet TAKE 1 TABLET (10 MG TOTAL) BY MOUTH AT BEDTIME.  . Multiple Vitamin (MULITIVITAMIN WITH MINERALS) TABS Take 1 tablet by mouth daily.    Marland Kitchen NEXIUM 40 MG capsule TAKE ONE CAPSULE BY MOUTH TWICE A DAY  . nitroGLYCERIN (NITROSTAT) 0.3 MG SL tablet Place 0.3 mg under the tongue every 5 (five) minutes as needed for chest pain (as needed).  . NONFORMULARY OR COMPOUNDED ITEM Estradiol .02% 1 ML Prefilled Applicator Sig: apply vaginally twice a week #90 Day Supply with  4 refills  . ondansetron (ZOFRAN) 4 MG tablet Take 1 tablet (4 mg total) by mouth every 8 (eight) hours as needed for nausea or vomiting.  . Oxycodone HCl 20 MG TABS Take 20 mg every 4 (four) hours as needed by mouth (pain).   . OXYGEN Inhale 2 L into the lungs at bedtime.  . polyethylene glycol (MIRALAX / GLYCOLAX) packet Take 17 g by mouth 2 (two) times daily. (Patient taking differently: Take 17 g by mouth daily as needed for mild constipation. )  . predniSONE (DELTASONE) 10 MG tablet 4 tabs for 2 days, then 3 tabs for 2 days, 2 tabs for 2 days, then 1 tab for 2 days, then stop  . progesterone (PROMETRIUM) 200 MG capsule TAKE 1 CAPSULE BY MOUTH EVERY DAY FOR FIRST 12 DAYS OF MONTH  . promethazine (PHENERGAN) 25 MG tablet TAKE 1 TABLET BY MOUTH AT BEDTIME AS NEEDED FOR NAUSEA AND VOMITING  . triamcinolone cream (KENALOG)  0.1 % Apply 1 application daily as needed topically (rash).   No facility-administered encounter medications on file as of 11/29/2017.     Activities of Daily Living In your present state of health, do you have any difficulty performing the following activities: 11/29/2017 12/28/2016  Hearing? N N  Vision? N N  Difficulty concentrating or making decisions? N Y  Comment just forgets things  -  Walking or climbing stairs? N Y  Dressing or bathing? N Y  Doing errands, shopping? N Y  Conservation officer, nature and eating ? - Y  Using the Toilet? N N  In the past six months, have you accidently leaked urine? N N  Do you have problems with loss of bowel control? N N  Comment stomach issues  -  Managing your Medications? N N  Managing your Finances? N Y  Housekeeping or managing your Housekeeping? N Y  Some recent data might be hidden    Patient Care Team: Lucretia Kern, DO as PCP - General (Family Medicine) Rigoberto Noel, MD as Consulting Physician (Pulmonary Disease) Adrian Prows, MD as Consulting Physician (Cardiology) Lenon Oms, MD as Referring Physician (Obstetrics and  Gynecology) Veneda Melter, MD as Referring Physician (Cardiology) [name], MD as Consulting Physician (Anesthesiology) Terrance Mass, MD as Consulting Physician (Gynecology)    Assessment:   This is a routine wellness examination for Shelby Anderson.  Exercise Activities and Dietary recommendations Current Exercise Habits: Home exercise routine  Goals    . Patient Stated     Find some mental health services        Fall Risk Fall Risk  11/29/2017 12/28/2016 12/07/2016 11/08/2016 01/05/2016  Falls in the past year? No Yes Yes Yes No  Number falls in past yr: - 1 2 or more 2 or more -  Injury with Fall? - Yes Yes Yes -  Risk Factor Category  - High Fall Risk - - -  Risk for fall due to : - History of fall(s);Impaired balance/gait;Impaired mobility - - -  Follow up - Falls prevention discussed Falls prevention discussed Education provided -  Comment - - - doctors are aware  -     Depression Screen PHQ 2/9 Scores 11/29/2017 12/28/2016 11/15/2016 11/08/2016  PHQ - 2 Score 4 2 6 6   PHQ- 9 Score 9 8 20 15     PA placed her on [place] which she is still taking We spent 30 minutes discussing mental health  1. Will call [facility] for information regarding mental health counseling through the provider network  2. Will fup with local churches for assistance in assisting with copays. 3. Discussed the children's needs for therapy in lieu of ongoing parental inconsistency per the patient report. Given resources for counseling in [place] and asked her to call for sliding scale services. Will fup with Dr. [name] in January. Denies SI or other.  4. Last; will refer to thn SW to help with financial issues and stress     Cognitive Function     no issues in care of self or children noted Good historian;     Immunization History  Administered Date(s) Administered  . Influenza Split 08/19/2011  . Influenza Whole 01/03/2013  . Influenza,inj,Quad PF,6+ Mos 08/20/2013, 09/15/2014, 08/26/2015,  08/01/2016, 08/22/2017  . Influenza-Unspecified 08/03/2016  . Pneumococcal Polysaccharide-23 08/19/2011  . Tdap 06/28/2014      Screening Tests Health Maintenance  Topic Date Due  . PAP SMEAR  07/07/2017  . PNEUMOCOCCAL POLYSACCHARIDE VACCINE (2) 12/02/2017 (Originally 08/18/2016)  .  HEMOGLOBIN A1C  02/19/2018  . FOOT EXAM  05/20/2018  . URINE MICROALBUMIN  05/20/2018  . OPHTHALMOLOGY EXAM  07/22/2018  . MAMMOGRAM  08/08/2019  . COLONOSCOPY  12/29/2020  . TETANUS/TDAP  06/28/2024  . INFLUENZA VACCINE  Completed  . Hepatitis C Screening  Completed  . HIV Screening  Completed          Plan:     PCP Notes   Health Maintenance Educated regarding pap smear q 3 years until 61yo  Last reported pap 07/2014  Abnormal Screens  Depression fup apt with Dr. [name] 12/12/2017 Will fup with insurer for coverage of mental health  and case management if available. Will have [facility] call Will call local churches for assistance with mental health copays.  Referrals  ( She was referred to [facility] 11/2016 for assistance with meds)  Patient concerns; As noted  Somewhat sad due to GI news of possible more medical problems, but GI does not understand why she is so nauseated.  Continues with medication regime    Nurse Concerns; As noted   Next PCP apt Dec 12, 2017      I have personally reviewed and noted the following in the patient's chart:   . Medical and social history . Use of alcohol, tobacco or illicit drugs  . Current medications and supplements . Functional ability and status . Nutritional status . Physical activity . Advanced directives . List of other physicians . Hospitalizations, surgeries, and ER visits in previous 12 months . Vitals . Screenings to include cognitive, depression, and falls . Referrals and appointments  In addition, I have reviewed and discussed with patient certain preventive protocols, quality metrics, and best  practice recommendations. A written personalized care plan for preventive services as well as general preventive health recommendations were provided to patient.     QKMMN,OTRRN, RN  11/29/2017  Above reviewed in primary provider's absence.  Agee with assessment.  Eulas Post MD [facility] at [facility]

## 2017-11-29 NOTE — Telephone Encounter (Signed)
AWV 12/28 To fup on referral to [facility]  fup on Depression. See Dr. [name] 1/10

## 2017-11-29 NOTE — Patient Instructions (Addendum)
Shelby Anderson , Thank you for taking time to come for your Medicare Wellness Visit. I appreciate your ongoing commitment to your health goals. Please review the following plan we discussed and let me know if I can assist you in the future.   Would recommend mental health evaluation  Please call you [facility] insurance and ask them for a mental health case manager.  Ask them if they have mental health counseling over the phone as you cannot get this need met. Can outreach churches to see if they will help with copays  Will make referral again to [facility] for pharmacy assistance as I did last year  Diabetes and weight loss; [facility]  Phone: [phone]   [place] [facility]; [phone] Sr. Line; 850-249-9529 Get resource to get information on any and all community programs for Seniors  Community solutions; "Aging Gracefully In Place" program; can request or apply  [place]: 251-284-6417 Community Health Response Program -109-323-5573 Public Health Dept; Need to be a skilled visit but can assist with bathing as well; 346-403-8978    Help with Rx at [place]  Monday - Friday 8am to 10pm EST Sat- Sunday 9am to 7pm Patient help line 562-774-7341 Email support online at [employer]  Dept of Social Services; Call [phone] and ask for SW on call  Options for Medicaid include the Community Alternatives program; [employer] (personal care services) or PACE program, which is a medical and social program combined     [employer] general resources for food etc    Deaf & Hard of Hearing Division Services - can assist with hearing aid x 1  No reviews  [address] Office  [place] [address]  [phone]  Pap smear is due q 3 years until she is 89; will update the record that pap is current      These are the goals we discussed: Goals    . Patient Stated     Find some mental  health services        This is a list of the screening recommended for you and due dates:  Health Maintenance  Topic Date Due  . Pap Smear  07/07/2017  . Pneumococcal vaccine (2) 12/02/2017*  . Hemoglobin A1C  02/19/2018  . Complete foot exam   05/20/2018  . Urine Protein Check  05/20/2018  . Eye exam for diabetics  07/22/2018  . Mammogram  08/08/2019  . Colon Cancer Screening  12/29/2020  . Tetanus Vaccine  06/28/2024  . Flu Shot  Completed  .  Hepatitis C: One time screening is recommended by Center for Disease Control  (CDC) for  adults born from [birth year] through 1963.   Completed  . HIV Screening  Completed  *Topic was postponed. The date shown is not the original due date.    Health Maintenance for Postmenopausal Women Menopause is a normal process in which your reproductive ability comes to an end. This process happens gradually over a span of months to years, usually between the ages of 39 and 54. Menopause is complete when you have missed 12 consecutive menstrual periods. It is important to talk with your health care provider about some of the most common conditions that affect postmenopausal women, such as heart disease, cancer, and bone loss (osteoporosis). Adopting a healthy lifestyle and getting preventive care can help to promote your health and wellness. Those actions can also lower your chances of developing some of these common conditions. What should I know about  menopause? During menopause, you may experience a number of symptoms, such as:  Moderate-to-severe hot flashes.  Night sweats.  Decrease in sex drive.  Mood swings.  Headaches.  Tiredness.  Irritability.  Memory problems.  Insomnia.  Choosing to treat or not to treat menopausal changes is an individual decision that you make with your health care provider. What should I know about hormone replacement therapy and supplements? Hormone therapy products are effective for treating symptoms that are  associated with menopause, such as hot flashes and night sweats. Hormone replacement carries certain risks, especially as you become older. If you are thinking about using estrogen or estrogen with progestin treatments, discuss the benefits and risks with your health care provider. What should I know about heart disease and stroke? Heart disease, heart attack, and stroke become more likely as you age. This may be due, in part, to the hormonal changes that your body experiences during menopause. These can affect how your body processes dietary fats, triglycerides, and cholesterol. Heart attack and stroke are both medical emergencies. There are many things that you can do to help prevent heart disease and stroke:  Have your blood pressure checked at least every 1-2 years. High blood pressure causes heart disease and increases the risk of stroke.  If you are 33-2 years old, ask your health care provider if you should take aspirin to prevent a heart attack or a stroke.  Do not use any tobacco products, including cigarettes, chewing tobacco, or electronic cigarettes. If you need help quitting, ask your health care provider.  It is important to eat a healthy diet and maintain a healthy weight. ? Be sure to include plenty of vegetables, fruits, low-fat dairy products, and lean protein. ? Avoid eating foods that are high in solid fats, added sugars, or salt (sodium).  Get regular exercise. This is one of the most important things that you can do for your health. ? Try to exercise for at least 150 minutes each week. The type of exercise that you do should increase your heart rate and make you sweat. This is known as moderate-intensity exercise. ? Try to do strengthening exercises at least twice each week. Do these in addition to the moderate-intensity exercise.  Know your numbers.Ask your health care provider to check your cholesterol and your blood glucose. Continue to have your blood tested as directed  by your health care provider.  What should I know about cancer screening? There are several types of cancer. Take the following steps to reduce your risk and to catch any cancer development as early as possible. Breast Cancer  Practice breast self-awareness. ? This means understanding how your breasts normally appear and feel. ? It also means doing regular breast self-exams. Let your health care provider know about any changes, no matter how small.  If you are 24 or older, have a clinician do a breast exam (clinical breast exam or CBE) every year. Depending on your age, family history, and medical history, it may be recommended that you also have a yearly breast X-ray (mammogram).  If you have a family history of breast cancer, talk with your health care provider about genetic screening.  If you are at high risk for breast cancer, talk with your health care provider about having an MRI and a mammogram every year.  Breast cancer (BRCA) gene test is recommended for women who have family members with BRCA-related cancers. Results of the assessment will determine the need for genetic counseling and BRCA1 and  for BRCA2 testing. BRCA-related cancers include these types: ? Breast. This occurs in males or females. ? Ovarian. ? Tubal. This may also be called fallopian tube cancer. ? Cancer of the abdominal or pelvic lining (peritoneal cancer). ? Prostate. ? Pancreatic.  Cervical, Uterine, and Ovarian Cancer Your health care provider may recommend that you be screened regularly for cancer of the pelvic organs. These include your ovaries, uterus, and vagina. This screening involves a pelvic exam, which includes checking for microscopic changes to the surface of your cervix (Pap test).  For women ages 21-65, health care providers may recommend a pelvic exam and a Pap test every three years. For women ages 62-65, they may recommend the Pap test and pelvic exam, combined with testing for human papilloma  virus (HPV), every five years. Some types of HPV increase your risk of cervical cancer. Testing for HPV may also be done on women of any age who have unclear Pap test results.  Other health care providers may not recommend any screening for nonpregnant women who are considered low risk for pelvic cancer and have no symptoms. Ask your health care provider if a screening pelvic exam is right for you.  If you have had past treatment for cervical cancer or a condition that could lead to cancer, you need Pap tests and screening for cancer for at least 20 years after your treatment. If Pap tests have been discontinued for you, your risk factors (such as having a new sexual partner) need to be reassessed to determine if you should start having screenings again. Some women have medical problems that increase the chance of getting cervical cancer. In these cases, your health care provider may recommend that you have screening and Pap tests more often.  If you have a family history of uterine cancer or ovarian cancer, talk with your health care provider about genetic screening.  If you have vaginal bleeding after reaching menopause, tell your health care provider.  There are currently no reliable tests available to screen for ovarian cancer.  Lung Cancer Lung cancer screening is recommended for adults 42-79 years old who are at high risk for lung cancer because of a history of smoking. A yearly low-dose CT scan of the lungs is recommended if you:  Currently smoke.  Have a history of at least 30 pack-years of smoking and you currently smoke or have quit within the past 15 years. A pack-year is smoking an average of one pack of cigarettes per day for one year.  Yearly screening should:  Continue until it has been 15 years since you quit.  Stop if you develop a health problem that would prevent you from having lung cancer treatment.  Colorectal Cancer  This type of cancer can be detected and can often  be prevented.  Routine colorectal cancer screening usually begins at age 93 and continues through age 22.  If you have risk factors for colon cancer, your health care provider may recommend that you be screened at an earlier age.  If you have a family history of colorectal cancer, talk with your health care provider about genetic screening.  Your health care provider may also recommend using home test kits to check for hidden blood in your stool.  A small camera at the end of a tube can be used to examine your colon directly (sigmoidoscopy or colonoscopy). This is done to check for the earliest forms of colorectal cancer.  Direct examination of the colon should be repeated every 5-10  years until age 72. However, if early forms of precancerous polyps or small growths are found or if you have a family history or genetic risk for colorectal cancer, you may need to be screened more often.  Skin Cancer  Check your skin from head to toe regularly.  Monitor any moles. Be sure to tell your health care provider: ? About any new moles or changes in moles, especially if there is a change in a mole's shape or color. ? If you have a mole that is larger than the size of a pencil eraser.  If any of your family members has a history of skin cancer, especially at a young age, talk with your health care provider about genetic screening.  Always use sunscreen. Apply sunscreen liberally and repeatedly throughout the day.  Whenever you are outside, protect yourself by wearing long sleeves, pants, a wide-brimmed hat, and sunglasses.  What should I know about osteoporosis? Osteoporosis is a condition in which bone destruction happens more quickly than new bone creation. After menopause, you may be at an increased risk for osteoporosis. To help prevent osteoporosis or the bone fractures that can happen because of osteoporosis, the following is recommended:  If you are 78-23 years old, get at least 1,000 mg of  calcium and at least 600 mg of vitamin D per day.  If you are older than age 70 but younger than age 31, get at least 1,200 mg of calcium and at least 600 mg of vitamin D per day.  If you are older than age 43, get at least 1,200 mg of calcium and at least 800 mg of vitamin D per day.  Smoking and excessive alcohol intake increase the risk of osteoporosis. Eat foods that are rich in calcium and vitamin D, and do weight-bearing exercises several times each week as directed by your health care provider. What should I know about how menopause affects my mental health? Depression may occur at any age, but it is more common as you become older. Common symptoms of depression include:  Low or sad mood.  Changes in sleep patterns.  Changes in appetite or eating patterns.  Feeling an overall lack of motivation or enjoyment of activities that you previously enjoyed.  Frequent crying spells.  Talk with your health care provider if you think that you are experiencing depression. What should I know about immunizations? It is important that you get and maintain your immunizations. These include:  Tetanus, diphtheria, and pertussis (Tdap) booster vaccine.  Influenza every year before the flu season begins.  Pneumonia vaccine.  Shingles vaccine.  Your health care provider may also recommend other immunizations. This information is not intended to replace advice given to you by your health care provider. Make sure you discuss any questions you have with your health care provider. Document Released: 01/11/2006 Document Revised: 06/08/2016 Document Reviewed: 08/23/2015 Elsevier Interactive Patient Education  2018 [place] in the Home Falls can cause injuries and can affect people from all age groups. There are many simple things that you can do to make your home safe and to help prevent falls. What can I do on the outside of my home?  Regularly repair the edges of  walkways and driveways and fix any cracks.  Remove high doorway thresholds.  Trim any shrubbery on the main path into your home.  Use bright outdoor lighting.  Clear walkways of debris and clutter, including tools and rocks.  Regularly check that handrails are  securely fastened and in good repair. Both sides of any steps should have handrails.  Install guardrails along the edges of any raised decks or porches.  Have leaves, snow, and ice cleared regularly.  Use sand or salt on walkways during winter months.  In the garage, clean up any spills right away, including grease or oil spills. What can I do in the bathroom?  Use night lights.  Install grab bars by the toilet and in the tub and shower. Do not use towel bars as grab bars.  Use non-skid mats or decals on the floor of the tub or shower.  If you need to sit down while you are in the shower, use a plastic, non-slip stool.  Keep the floor dry. Immediately clean up any water that spills on the floor.  Remove soap buildup in the tub or shower on a regular basis.  Attach bath mats securely with double-sided non-slip rug tape.  Remove throw rugs and other tripping hazards from the floor. What can I do in the bedroom?  Use night lights.  Make sure that a bedside light is easy to reach.  Do not use oversized bedding that drapes onto the floor.  Have a firm chair that has side arms to use for getting dressed.  Remove throw rugs and other tripping hazards from the floor. What can I do in the kitchen?  Clean up any spills right away.  Avoid walking on wet floors.  Place frequently used items in easy-to-reach places.  If you need to reach for something above you, use a sturdy step stool that has a grab bar.  Keep electrical cables out of the way.  Do not use floor polish or wax that makes floors slippery. If you have to use wax, make sure that it is non-skid floor wax.  Remove throw rugs and other tripping hazards  from the floor. What can I do in the stairways?  Do not leave any items on the stairs.  Make sure that there are handrails on both sides of the stairs. Fix handrails that are broken or loose. Make sure that handrails are as long as the stairways.  Check any carpeting to make sure that it is firmly attached to the stairs. Fix any carpet that is loose or worn.  Avoid having throw rugs at the top or bottom of stairways, or secure the rugs with carpet tape to prevent them from moving.  Make sure that you have a light switch at the top of the stairs and the bottom of the stairs. If you do not have them, have them installed. What are some other fall prevention tips?  Wear closed-toe shoes that fit well and support your feet. Wear shoes that have rubber soles or low heels.  When you use a stepladder, make sure that it is completely opened and that the sides are firmly locked. Have someone hold the ladder while you are using it. Do not climb a closed stepladder.  Add color or contrast paint or tape to grab bars and handrails in your home. Place contrasting color strips on the first and last steps.  Use mobility aids as needed, such as canes, walkers, scooters, and crutches.  Turn on lights if it is dark. Replace any light bulbs that burn out.  Set up furniture so that there are clear paths. Keep the furniture in the same spot.  Fix any uneven floor surfaces.  Choose a carpet design that does not hide the edge of steps  of a stairway.  Be aware of any and all pets.  Review your medicines with your healthcare provider. Some medicines can cause dizziness or changes in blood pressure, which increase your risk of falling. Talk with your health care provider about other ways that you can decrease your risk of falls. This may include working with a physical therapist or trainer to improve your strength, balance, and endurance. This information is not intended to replace advice given to you by your  health care provider. Make sure you discuss any questions you have with your health care provider. Document Released: 11/09/2002 Document Revised: 04/17/2016 Document Reviewed: 12/24/2014 Elsevier Interactive Patient Education  [employer].

## 2017-12-02 ENCOUNTER — Other Ambulatory Visit: Payer: Self-pay | Admitting: Internal Medicine

## 2017-12-02 ENCOUNTER — Other Ambulatory Visit: Payer: Self-pay | Admitting: Family Medicine

## 2017-12-02 DIAGNOSIS — J45991 Cough variant asthma: Secondary | ICD-10-CM

## 2017-12-02 MED ORDER — NONFORMULARY OR COMPOUNDED ITEM: 2 refills | Status: DC

## 2017-12-02 NOTE — Addendum Note (Signed)
Addended by: Harriett Sine D on: 12/02/2017 02:32 PM   Modules accepted: Orders

## 2017-12-02 NOTE — Telephone Encounter (Signed)
Pt states Dr. March Rummage was going to prescribe a medication she could put on her nails but it is not at her pharmacy [facility] [address]. I told pt the prescription would come from [facility] [phone] and they would call to discuss coverage and delivery. I apologized for the delay and told her I would put a rush on the rx. Pt states understanding.

## 2017-12-05 ENCOUNTER — Other Ambulatory Visit: Payer: Self-pay

## 2017-12-05 DIAGNOSIS — I420 Dilated cardiomyopathy: Secondary | ICD-10-CM

## 2017-12-05 NOTE — Telephone Encounter (Signed)
Agree. [facility] Anderson psychiatry may be helpful if she feels she needs psychiatry care.

## 2017-12-05 NOTE — Telephone Encounter (Signed)
Call to Shelby Anderson. Home phone was busy x 2  Mobile phone had a VM, but no identifier of owner  Did not leave a message. Will attempt outreach tomorrow to give her information on [facility] if needed.

## 2017-12-05 NOTE — Progress Notes (Unsigned)
AWV completed and the patient found the [facility] referral last year helpful. Is still having issues this year with getting her medicines, as well as finding mental health she can afford. Will refer back to [facility] for support from Pharm D and SW if she meets criteria.  Wynetta Fines RN  [phone]

## 2017-12-05 NOTE — Telephone Encounter (Signed)
Dr.Kim  AWV 12/28   [facility] referral placed for pharm D and SW to review for possible community counseling services she can afford.  Keeping her grandchildren through the week.  Christmas was difficult and also told by GI with her last endo 10/29/17 that she had a "watermelon belly".  GI told her they will repeat to confirm.   She has ongoing difficult family issues;  Agreed to fup with [facility] referral sent today Given referral for community counseling with recommendations on those that may see her for [place] fee.   She loves her children, denies SI; engaged in the assessment and was participating in ideas to resolve her issues. Left feeling hopeful.  Fup apt with Dr. [name] 1/10 and will defer dx of Depression until Dr. [name] can evaluate which seems situational due to the holiday, the lack of parental fup for the grand children she is keeping and financial stressors.  Please note of Plan is sufficient.  Wynetta Fines RN

## 2017-12-10 NOTE — Telephone Encounter (Signed)
Call to the patient on home phone and there was no answer. Call to the cell  Number and left VM regarding [facility] services to access at [phone] or she can call my line directly at [place]

## 2017-12-12 ENCOUNTER — Encounter: Payer: Self-pay | Admitting: Family Medicine

## 2017-12-12 ENCOUNTER — Ambulatory Visit (INDEPENDENT_AMBULATORY_CARE_PROVIDER_SITE_OTHER): Payer: Medicare Other | Admitting: Family Medicine

## 2017-12-12 ENCOUNTER — Telehealth: Payer: Self-pay | Admitting: *Deleted

## 2017-12-12 VITALS — BP 118/78 | HR 70 | Temp 98.7°F | Ht 66.0 in | Wt 233.3 lb

## 2017-12-12 DIAGNOSIS — Z23 Encounter for immunization: Secondary | ICD-10-CM

## 2017-12-12 DIAGNOSIS — E1169 Type 2 diabetes mellitus with other specified complication: Secondary | ICD-10-CM | POA: Diagnosis not present

## 2017-12-12 DIAGNOSIS — I420 Dilated cardiomyopathy: Secondary | ICD-10-CM | POA: Diagnosis not present

## 2017-12-12 DIAGNOSIS — I10 Essential (primary) hypertension: Secondary | ICD-10-CM | POA: Diagnosis not present

## 2017-12-12 DIAGNOSIS — E785 Hyperlipidemia, unspecified: Secondary | ICD-10-CM

## 2017-12-12 DIAGNOSIS — F339 Major depressive disorder, recurrent, unspecified: Secondary | ICD-10-CM | POA: Diagnosis not present

## 2017-12-12 DIAGNOSIS — E1159 Type 2 diabetes mellitus with other circulatory complications: Secondary | ICD-10-CM | POA: Diagnosis not present

## 2017-12-12 NOTE — Telephone Encounter (Signed)
Patient stated she forgot to mention to Dr [name] that Dr Hardin Negus (pain mgmt) advised she ask her PCP to check into further testing for CREST syndrome as she has 2 symptoms already. Patient stated he told her the stomach issues and Raynaud's could be related to this as she does not seem to improve despite taking medications in high doses.  Message sent to Dr [name].

## 2017-12-12 NOTE — Progress Notes (Signed)
HPI:  Shelby Anderson is a pleasant 62 y.o. here for follow up. Chronic medical problems summarized below were reviewed for changes and stability and were updated as needed below. These issues and their treatment remain stable for the most part. No steroids in some time and wants to recheck blood sugars. Mood is stable - she is considering getting counseling. Denies CP, SOB, DOE, treatment intolerance or new symptoms.   Diabetes Mellitus: -borderline/mild diabetic for some time - elevated last check but had steroids -takes cinnamon -no regular exercise, but she is active taking care of grandchildren -hx CAD (sees cardiologist), HLD, HTN, orthostattic hypotension - on statin and asa, not on acei - has asthma  GERD/constipation: -hx bad reflux, esophageal stricture s/p dilation and gastric ulcer -sees Dr. Henrene Pastor for this  -on nexium 40mg  bid, carafate, prn phenergan, colace  CAD hx MI x2 per her report/Arrythmia/HTN/HLD/orthostatic hypotension/presyncope: -seeing several cardiologists -meds: asa, metoprolol, fish oil, isosorbide, florinef, K+, lipitor, ntg prn -denies: CP, SOB, palpitations  Asthma/Allergies: -sees pulmonologist for this -meds: singulair, symbicort, albuterol, flonase  Insomnia: -uses benydrl occ for this -eszopiclone on med list from ? [facility] -hx of low O2 at night  Chronic back pain/Knee pain: -hx DDD and neuropathy, OA knees -seeing [place] pain management specialist and referred to ortho -on daily oxycodone 20mg  3 times daily, lyrica, deplin, robaxin - doesn't think any of them help; reports pain doctor doesn't want her to stop them -on methadone in the past and opana - reports reacted to them  -aware of risks with medications, adamant about continuing mobic as feels is only medication that helps  MDD: -reports on deplin from pain management for this, does not feel helps -declined other treatments offered here -Chronic symptoms: mildly  constant depressed mood and hopeless feelings at times, no SI, panic or manic symptoms  HRT/postmenopausl bleeding: -on prometrium with her gynecologist, Dr. Toney Rakes   ROS: See pertinent positives and negatives per HPI.  Past Medical History:  Diagnosis Date  . Allergy   . Anemia    "chronic"  . Angina   . Anxiety and depression   . Arthritis   . Asthma   . Atrial fibrillation ([place])    h/o "AF w/frequent PVCs"  . Cancer (HCC)    hx of skin cancer   . CHF (congestive heart failure) ([place])   . Chronic back pain greater than 3 months duration    on chronic narcotics, treated at pain clinic  . Colon polyps    hyperplastic  . Coronary artery disease    Arrythmia, orthostatic hypotension, HLD, HTN; sees Dr. Einar Gip  . Difficult intubation    "TMJ & woke up when they were still cutting on me"  . Dysrhythmia    sees Dr. Einar Gip and a cardiologist at [facility]  . Esophageal stricture   . Fatty liver   . Fibroids   . Fibromyalgia    "in my legs"  . GERD (gastroesophageal reflux disease)    hx hiatal hernia, stricture and gastric ulcer  . Headache(784.0)   . Heart murmur   . Hiatal hernia   . History of loop recorder   . History of migraines    "dx'd when I was in my teens"  . Hyperlipemia   . Hypertension   . Mental disorder   . Mild episode of recurrent major depressive disorder ([place]) 12/06/2015  . Myocardial infarction [facility]) 1980's & 1990;   sees Dr. Einar Gip  . Pneumonia   . PONV (postoperative  nausea and vomiting)   . Recurrent upper respiratory infection (URI)   . Shortness of breath 11/20/11   "all the time", sees pulmonlogy, ? asthma  . Stenotic cervical os   . Stomach ulcer    "3 small; found in 05/2011"  . TMJ (dislocation of temporomandibular joint)   . Tuberculosis    + TB SKIN TEST  . Type 2 diabetes mellitus without complication, without long-term current use of insulin ([place]) 12/06/2015   not on meds     Past Surgical History:  Procedure Laterality Date   . ACHILLES TENDON REPAIR  1970's   left ankle  . ARTHROSCOPIC REPAIR ACL     left knee cap  . BREAST BIOPSY Right 04/08/2013  . CARDIAC CATHETERIZATION     loop recorder  . CARPAL TUNNEL RELEASE  unknown   left hand  . ESOPHAGOGASTRODUODENOSCOPY (EGD) WITH PROPOFOL N/A 10/29/2017   Procedure: ESOPHAGOGASTRODUODENOSCOPY (EGD) WITH PROPOFOL;  Surgeon: Irene Shipper, MD;  Location: [facility] ENDOSCOPY;  Service: Endoscopy;  Laterality: N/A;  . LOOP RECORDER IMPLANT    . post ganglionectomy  1970's   "for migraine headaches"  . pouch string  [phone]   "did this 3 times (once w/each pregnancy)"  . SAVORY DILATION N/A 10/29/2017   Procedure: SAVORY DILATION;  Surgeon: Irene Shipper, MD;  Location: [facility];  Service: Endoscopy;  Laterality: N/A;  . TOTAL KNEE ARTHROPLASTY Left 09/25/2016   Procedure: LEFT TOTAL KNEE ARTHROPLASTY;  Surgeon: Paralee Cancel, MD;  Location: [facility] ORS;  Service: Orthopedics;  Laterality: Left;  . TUBAL LIGATION  1980's    Family History  Problem Relation Age of Onset  . Malignant hyperthermia Father   . Hypertension Father   . Heart disease Father   . Diabetes Father   . Cancer Father        skin  . Hypertension Mother   . Heart disease Mother   . Cancer Sister        CERVICAL  . Hypertension Sister   . Cancer Brother        MELANOMA  . Heart disease Maternal Grandmother   . Heart disease Maternal Grandfather   . Cancer Paternal Grandmother        ?   Marland Kitchen Heart disease Paternal Grandmother   . Heart disease Paternal Grandfather   . Cancer Brother        LUNG  . Diabetes Sister   . Hypertension Sister   . Heart disease Sister   . Cancer Sister   . Cancer Brother   . Anesthesia problems Neg Hx   . Hypotension Neg Hx   . Pseudochol deficiency Neg Hx   . Colon cancer Neg Hx   . Esophageal cancer Neg Hx   . Pancreatic cancer Neg Hx   . Rectal cancer Neg Hx   . Stomach cancer Neg Hx     Social History   Socioeconomic History  . Marital status:  Married    Spouse name: None  . Number of children: 3  . Years of education: None  . Highest education level: None  Social Needs  . Financial resource strain: None  . Food insecurity - worry: None  . Food insecurity - inability: None  . Transportation needs - medical: None  . Transportation needs - non-medical: None  Occupational History  . Occupation: Retired Therapist, sports  Tobacco Use  . Smoking status: Never Smoker  . Smokeless tobacco: Never Used  Substance and Sexual Activity  . Alcohol use:  No    Alcohol/week: 0.0 oz  . Drug use: No  . Sexual activity: Yes    Birth control/protection: Surgical  Other Topics Concern  . None  Social History Narrative  . None     Current Outpatient Medications:  .  albuterol (PROAIR HFA) 108 (90 Base) MCG/ACT inhaler, Inhale 2 puffs into the lungs every 4 hours as needed for shortness of breath, Disp: 8 g, Rfl: 3 .  albuterol (PROVENTIL) (2.5 MG/3ML) 0.083% nebulizer solution, Take 3 mLs (2.5 mg total) by nebulization every 6 (six) hours as needed for wheezing or shortness of breath (J45.40)., Disp: 75 mL, Rfl: 3 .  aspirin EC 81 MG tablet, Take 81 mg at bedtime by mouth. , Disp: , Rfl:  .  atorvastatin (LIPITOR) 40 MG tablet, TAKE 1 TABLET BY MOUTH EVERY DAY (Patient taking differently: TAKE 1 TABLET BY MOUTH EVERY DAY AT NIGHT), Disp: 90 tablet, Rfl: 1 .  budesonide-formoterol (SYMBICORT) 80-4.5 MCG/ACT inhaler, INHALE 2 PUFFS INTO THE LUNGS FIRST THING IN THE AM AND THEN ANOTHER 2 PUFFS 12 HOURS LATER, Disp: 10.2 Inhaler, Rfl: 5 .  Calcium Carb-Cholecalciferol (CALCIUM 600+D) 600-800 MG-UNIT TABS, Take 1 tablet daily at 12 noon by mouth. , Disp: , Rfl:  .  CARAFATE 1 GM/10ML suspension, TAKE 10 MILLILITERS BY MOUTH 4 TIMES DAILY WITH MEALS AND AT BEDTIME, Disp: 420 mL, Rfl: 2 .  CINNAMON PO, Take 1,000 mg 2 (two) times daily by mouth., Disp: , Rfl:  .  Cyanocobalamin (VITAMIN B-12) 5000 MCG TBDP, Take 5,000 mcg by mouth daily. , Disp: , Rfl:  .   diphenhydrAMINE (BENADRYL) 25 MG tablet, Take 25 mg daily as needed by mouth (allergic reactions)., Disp: , Rfl:  .  docusate sodium (COLACE) 100 MG capsule, Take 1 capsule (100 mg total) by mouth 2 (two) times daily. (Patient taking differently: Take 100-200 mg by mouth daily as needed for mild constipation. ), Disp: 10 capsule, Rfl: 0 .  Eszopiclone (ESZOPICLONE) 3 MG TABS, Take 3 mg by mouth at bedtime as needed (insomnia). , Disp: , Rfl:  .  fluticasone (FLONASE) 50 MCG/ACT nasal spray, Place 2 sprays at bedtime as needed into both nostrils for allergies or rhinitis. , Disp: , Rfl:  .  isosorbide mononitrate (IMDUR) 60 MG 24 hr tablet, TAKE 1 TABLET BY MOUTH EVERY DAY, Disp: 90 tablet, Rfl: 1 .  KLOR-CON M20 20 MEQ tablet, TAKE 1 TABLET BY MOUTH EVERY DAY, Disp: 30 tablet, Rfl: 5 .  L-Methylfolate (DEPLIN) 7.5 MG TABS, Take 7.5 mg by mouth daily with breakfast. , Disp: , Rfl:  .  LYRICA 75 MG capsule, Take 75 mg by mouth 3 (three) times daily. , Disp: , Rfl:  .  methocarbamol (ROBAXIN) 500 MG tablet, Take 1 tablet (500 mg total) by mouth every 6 (six) hours as needed for muscle spasms. (Patient taking differently: Take 500 mg 3 (three) times daily as needed by mouth for muscle spasms. ), Disp: 60 tablet, Rfl: 0 .  metoprolol tartrate (LOPRESSOR) 50 MG tablet, TAKE 1 TABLET TWICE A DAY, Disp: 180 tablet, Rfl: 1 .  Misc Natural Products (GLUCOS-CHONDROIT-MSM COMPLEX PO), Take 1 tablet 3 (three) times daily by mouth. , Disp: , Rfl:  .  montelukast (SINGULAIR) 10 MG tablet, TAKE 1 TABLET (10 MG TOTAL) BY MOUTH AT BEDTIME., Disp: 90 tablet, Rfl: 3 .  Multiple Vitamin (MULITIVITAMIN WITH MINERALS) TABS, Take 1 tablet by mouth daily.  , Disp: , Rfl:  .  NEXIUM 40  MG capsule, TAKE ONE CAPSULE BY MOUTH TWICE A DAY, Disp: 60 capsule, Rfl: 6 .  nitroGLYCERIN (NITROSTAT) 0.3 MG SL tablet, Place 0.3 mg under the tongue every 5 (five) minutes as needed for chest pain (as needed)., Disp: , Rfl:  .  NONFORMULARY  OR COMPOUNDED ITEM, Estradiol .02% 1 ML Prefilled Applicator Sig: apply vaginally twice a week #90 Day Supply with 4 refills, Disp: 1 each, Rfl: 4 .  NONFORMULARY OR COMPOUNDED ITEM, [facility]:  Onychomycosis nail lacquer - fluconazole 2%, terbinafine 1%, DMSO apply to affected area daily., Disp: 120 each, Rfl: 2 .  Oxycodone HCl 20 MG TABS, Take 20 mg every 4 (four) hours as needed by mouth (pain). , Disp: , Rfl:  .  OXYGEN, Inhale 2 L into the lungs at bedtime., Disp: , Rfl:  .  progesterone (PROMETRIUM) 200 MG capsule, TAKE 1 CAPSULE BY MOUTH EVERY DAY FOR FIRST 12 DAYS OF MONTH, Disp: 36 capsule, Rfl: 2 .  promethazine (PHENERGAN) 25 MG tablet, TAKE 1 TABLET BY MOUTH AT BEDTIME AS NEEDED FOR NAUSEA AND VOMITING, Disp: 30 tablet, Rfl: 1 .  triamcinolone cream (KENALOG) 0.1 %, APPLY 1 APPLICATION TOPICALLY 2 (TWO) TIMES DAILY., Disp: 80 g, Rfl: 1  EXAM:  Vitals:   12/12/17 1605  BP: 118/78  Pulse: 70  Temp: 98.7 F (37.1 C)    Body mass index is 37.66 kg/m.  GENERAL: vitals reviewed and listed above, alert, oriented, appears well hydrated and in no acute distress  HEENT: atraumatic, conjunttiva clear, no obvious abnormalities on inspection of external nose and ears  NECK: no obvious masses on inspection  LUNGS: clear to auscultation bilaterally, no wheezes, rales or rhonchi, good air movement  CV: HRRR, no peripheral edema  MS: moves all extremities without noticeable abnormality  PSYCH: pleasant and cooperative, no obvious depression or anxiety  ASSESSMENT AND PLAN:  Discussed the following assessment and plan:  Type 2 diabetes mellitus with other circulatory complication, without long-term current use of insulin (HCC) - Plan: Hemoglobin A1c  Hyperlipidemia associated with type 2 diabetes mellitus ([place])  Hypertension associated with diabetes ([place]) - Plan: Basic metabolic panel, CBC  Dilated cardiomyopathy (HCC)  Episode of recurrent major depressive disorder,  unspecified depression episode severity ([place])  Need for prophylactic vaccination against Streptococcus pneumoniae (pneumococcus) - Plan: Pneumococcal polysaccharide vaccine 23-valent greater than or equal to 2yo subcutaneous/IM  -complicated medical hx on numerous medications, sees a number of specialists -labs per orders, discussed options -discussed options for tx depression, she is considering seeing psych, [facility] -she reports schedulig appt with gyn for cpe -pneumococcal booster -Patient advised to return or notify a doctor immediately if symptoms worsen or persist or new concerns arise.  Patient Instructions  BEFORE YOU LEAVE: -labs -pneumococcal 23 -review medication list and update in detail -follow up: 3-4 months  Call psychiatry or behavioral health to set up appointment about depression  We have ordered labs or studies at this visit. It can take up to 1-2 weeks for results and processing. IF results require follow up or explanation, we will call you with instructions. Clinically stable results will be released to your [facility]. If you have not heard from [place] or cannot find your results in [facility] in 2 weeks please contact our office at [phone].  If you are not yet signed up for [facility], please consider signing up.   We recommend the following healthy lifestyle for LIFE: 1) Small portions. But,  make sure to get regular (at least 3 per day), healthy meals and small healthy snacks if needed.  2) Eat a healthy clean diet.   TRY TO EAT: -at least 5-7 servings of low sugar, colorful, and nutrient rich vegetables per day (not corn, potatoes or bananas.) -berries are the best choice if you wish to eat fruit (only eat small amounts if trying to reduce weight)  -lean meets (fish, white meat of chicken or [place]) -vegan proteins for some meals - beans or tofu, whole grains, nuts and seeds -Replace bad fats with  good fats - good fats include: fish, nuts and seeds, canola oil, olive oil -small amounts of low fat or non fat dairy -small amounts of100 % whole grains - check the lables -drink plenty of water  AVOID: -SUGAR, sweets, anything with added sugar, corn syrup or sweeteners - must read labels as even foods advertised as "healthy" often are loaded with sugar -if you must have a sweetener, small amounts of stevia may be best -sweetened beverages and artificially sweetened beverages -simple starches (rice, bread, potatoes, pasta, chips, etc - small amounts of 100% whole grains are ok) -red meat, pork, butter -fried foods, fast food, processed food, excessive dairy, eggs and coconut.  3)Get at least 150 minutes of sweaty aerobic exercise per week.  4)Reduce stress - consider counseling, meditation and relaxation to balance other aspects of your life.          Colin Benton R., DO

## 2017-12-12 NOTE — Patient Instructions (Signed)
BEFORE YOU LEAVE: -labs -pneumococcal 23 -review medication list and update in detail -follow up: 3-4 months  Call psychiatry or behavioral health to set up appointment about depression  We have ordered labs or studies at this visit. It can take up to 1-2 weeks for results and processing. IF results require follow up or explanation, we will call you with instructions. Clinically stable results will be released to your [facility]. If you have not heard from [place] or cannot find your results in [facility] in 2 weeks please contact our office at [phone].  If you are not yet signed up for [facility], please consider signing up.   We recommend the following healthy lifestyle for LIFE: 1) Small portions. But, make sure to get regular (at least 3 per day), healthy meals and small healthy snacks if needed.  2) Eat a healthy clean diet.   TRY TO EAT: -at least 5-7 servings of low sugar, colorful, and nutrient rich vegetables per day (not corn, potatoes or bananas.) -berries are the best choice if you wish to eat fruit (only eat small amounts if trying to reduce weight)  -lean meets (fish, white meat of chicken or [place]) -vegan proteins for some meals - beans or tofu, whole grains, nuts and seeds -Replace bad fats with good fats - good fats include: fish, nuts and seeds, canola oil, olive oil -small amounts of low fat or non fat dairy -small amounts of100 % whole grains - check the lables -drink plenty of water  AVOID: -SUGAR, sweets, anything with added sugar, corn syrup or sweeteners - must read labels as even foods advertised as "healthy" often are loaded with sugar -if you must have a sweetener, small amounts of stevia may be best -sweetened beverages and artificially sweetened beverages -simple starches (rice, bread, potatoes, pasta, chips, etc - small amounts of 100% whole grains are ok) -red meat, pork, butter -fried foods, fast food, processed food, excessive dairy, eggs and  coconut.  3)Get at least 150 minutes of sweaty aerobic exercise per week.  4)Reduce stress - consider counseling, meditation and relaxation to balance other aspects of your life.

## 2017-12-13 LAB — HEMOGLOBIN A1C: Hgb A1c MFr Bld: 7.5 % — ABNORMAL HIGH (ref 4.6–6.5)

## 2017-12-13 LAB — CBC
HCT: 42 % (ref 36.0–46.0)
Hemoglobin: 13.5 g/dL (ref 12.0–15.0)
MCHC: 32.3 g/dL (ref 30.0–36.0)
MCV: 89.6 fl (ref 78.0–100.0)
PLATELETS: 271 10*3/uL (ref 150.0–400.0)
RBC: 4.68 Mil/uL (ref 3.87–5.11)
RDW: 14.7 % (ref 11.5–15.5)
WBC: 7.6 10*3/uL (ref 4.0–10.5)

## 2017-12-13 LAB — BASIC METABOLIC PANEL
BUN: 9 mg/dL (ref 6–23)
CALCIUM: 9.2 mg/dL (ref 8.4–10.5)
CO2: 35 mEq/L — ABNORMAL HIGH (ref 19–32)
Chloride: 98 mEq/L (ref 96–112)
Creatinine, Ser: 0.58 mg/dL (ref 0.40–1.20)
GFR: 112.81 mL/min (ref >60.00)
Glucose, Bld: 110 mg/dL — ABNORMAL HIGH (ref 70–99)
Potassium: 3.9 mEq/L (ref 3.5–5.1)
SODIUM: 140 meq/L (ref 135–145)

## 2017-12-13 NOTE — Telephone Encounter (Signed)
This would be a diagnosis to consider with a rheumatologist. She may want to talk with her pain doc at the next visit to see if he wants to refer her to rheum about his concerns. Thank you.

## 2017-12-16 NOTE — Telephone Encounter (Signed)
I left a detailed message with the information below at the pts home number. 

## 2017-12-17 MED ORDER — GLIPIZIDE 5 MG PO TABS: 2.5000 mg | ORAL_TABLET | Freq: Two times a day (BID) | ORAL | 5 refills | Status: DC

## 2017-12-17 NOTE — Addendum Note (Signed)
Addended by: Agnes Lawrence on: 12/17/2017 03:58 PM   Modules accepted: Orders

## 2017-12-18 ENCOUNTER — Ambulatory Visit: Payer: Self-pay | Admitting: *Deleted

## 2017-12-18 ENCOUNTER — Other Ambulatory Visit: Payer: Self-pay | Admitting: *Deleted

## 2017-12-18 NOTE — Patient Outreach (Signed)
[place] [facility]) Care Management  12/18/2017  Shelby Anderson [birth year] [id number]  Referral via primary care provider;  Reason: Needs Social Work consult for mental health referral; has family issues and can't afford mental health appointment; needs Pharmacy consult for review of cost of meds-can't afford meds.  Telephone call to patient; person who answered call voices that patient was not home; advised that they would give patient message to return call.  Plan: Will follow up.  Sherrin Daisy, RN BSN [place] Management Coordinator [facility] Care Management  [phone]

## 2017-12-23 DIAGNOSIS — J45909 Unspecified asthma, uncomplicated: Secondary | ICD-10-CM | POA: Diagnosis not present

## 2017-12-24 ENCOUNTER — Other Ambulatory Visit: Payer: Self-pay | Admitting: *Deleted

## 2017-12-24 NOTE — Patient Outreach (Signed)
[place] [facility]) Care Management  12/24/2017  Shelby Anderson 08/08/1956 [id number]  Referral via primary care provider;  Reason: Needs Social Work consult for mental health referral; has family issues and can't afford mental health appointment; needs Pharmacy consult for review of cost of meds-can't afford meds.  Telephone call to patient; left message on home phone & cell phone requesting call back.  Plan: Will follow up.  Sherrin Daisy, RN BSN [place] Management Coordinator [facility] Care Management  [phone]

## 2017-12-25 ENCOUNTER — Other Ambulatory Visit: Payer: Self-pay | Admitting: Family Medicine

## 2017-12-26 ENCOUNTER — Other Ambulatory Visit: Payer: Self-pay | Admitting: *Deleted

## 2017-12-26 NOTE — Patient Outreach (Signed)
[place] [facility]) Care Management  12/26/2017  Shelby Anderson May 20, 1956 [id number]  Referral via primary care provider;  Reason: Needs Social Work consult for mental health referral; has family issues and can't afford mental health appointment co pays; needs Pharmacy consult for review of cost of meds-can't afford medications.  Telephone call x 3 to patient; spoke with patient and advised of reason for referral and of [facility] care management services. HIPPA verification received from patient.  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 States she has problem with back pain, has bulging disc and knee pain. States able to stand short period of time and using walking stick to walk. States she does attend pain clinic for pain management. Voices that she uses oxygen at night because oxygen level falls below normal.  Also states she has had 2 heart attack, hx chronic angina. States depression started in 2012.   States was started on oral medication for diabetes because A1c level was 7.5 at last primary care visit.  States she is seeing primary care every 3 months. Also sees pulmonologist and cardiologist Voices she uses local pharmacy for prescriptions & manages her own medicines. States taking as ordered.    Patient reports that her major concerns are her lack of finances to afford medications and see specialist for her depression.  States she is not aware of community resources that she can go to for her depression that she is able to afford.    Voices that her spouse assists as needed, helps with household chores. States 2 grandchildren stay with her (under 24 years of age).  Patient consents to [facility] services-Clinical Social Worker for psycho-social concerns, community resources; Pharmacy for medication assistance; Health Coach for disease management for DM2 (needs update on right foods to eat).   Plan: Send to  care management assistant to assign to appropriate disciplines.  Telephonic signing off.   Sherrin Daisy, RN BSN [place] Management Coordinator [facility] Care Management  [phone]

## 2017-12-27 ENCOUNTER — Encounter: Payer: Self-pay | Admitting: *Deleted

## 2017-12-31 ENCOUNTER — Other Ambulatory Visit: Payer: Self-pay | Admitting: *Deleted

## 2017-12-31 NOTE — Patient Outreach (Signed)
[facility]) Care Management  12/31/2017  ERMEL VERNE Dec 03, 1956 [id number]    Patient referred to this social worker to review community resource needs and to explore patient's psycho-social issues. Phone call to patient who vented her frustrations related to the custody battle involving her 2 granddaughters. Per patient, there is tension in the foamily due to her decision to take her son to court regarding the care of the children. Patient discussed increased anxiety and depression reagrding this situation, stating that her grandchildren "give her a reason for living".. Mental health follow up suggested for the children, which in turn would support patient as well in regards to this situation. Patient discussed being in financial difficulty, and feeling that she is going deeper and deeper in debt.  Per patient, she recently escaped foerclosure.  Patient states that she returns to court next week, and is hoping the court cse will be resolved at that time, which will helpt to improve her overall mood. Supportive counseling provided, patient allowed to vent, positive coping explored.   Plan: This Education officer, museum will follow up with patient in 1 week regarding results of court case.   [name] [facility] Care Management [phone]

## 2018-01-01 ENCOUNTER — Other Ambulatory Visit: Payer: Self-pay | Admitting: Family Medicine

## 2018-01-06 ENCOUNTER — Other Ambulatory Visit: Payer: Self-pay | Admitting: Pharmacist

## 2018-01-06 NOTE — Patient Outreach (Signed)
[facility]) Care Management  01/06/2018  Shelby Anderson 01/22/1956 [id number]  Patient referred to [facility] Pharmacist by [facility] RN Vaughan Basta for medication patient assistance evaluation.   Unsuccessful phone outreach to patient, initial call, line was busy, received an immediate call back from a gentleman who reports patient was sleeping.  No PHI was exchanged, a HIPAA compliant message was left requesting patient return call.   Plan:  If no return call, will make second phone outreach in the next week.   Karrie Meres, PharmD, [place] [phone]

## 2018-01-07 ENCOUNTER — Other Ambulatory Visit: Payer: Self-pay | Admitting: Pharmacist

## 2018-01-07 NOTE — Patient Outreach (Signed)
[facility]) Care Management  01/07/2018  Shelby Anderson Jan 04, 1956 [id number]  Second outreach attempt to patient successful.  HIPAA details verified and purpose of call explained to patient.  Patient reports medications of particular cost concern are esomeprazole and pregabalin.  Patient reports she may be losing her voice---offered to call patient back later---she preferred this.   Plan:  Will make outreach attempt in the next week per patient request.   Karrie Meres, PharmD, [place] [phone]

## 2018-01-08 ENCOUNTER — Telehealth: Payer: Self-pay | Admitting: Pulmonary Disease

## 2018-01-08 MED ORDER — AZITHROMYCIN 250 MG PO TABS: ORAL_TABLET | ORAL | 0 refills | Status: DC

## 2018-01-08 NOTE — Telephone Encounter (Signed)
Z-Pak. This could also be viral such as influenza, if she has fever call back for Tamiflu Plenty of oral fluids. Mucinex 600 twice daily. Call us back for office visit if no better

## 2018-01-08 NOTE — Telephone Encounter (Signed)
rx sent to preferred pharmacy.  Aware of recs.  Nothing further needed.

## 2018-01-08 NOTE — Telephone Encounter (Signed)
Called and spoke with patient, she is having increased SOB, productive cough with green/gray mucous, chest tightness, body aches, dry heaving, nausea. Denies fever and chest pain. Patient states that she goes into pneumonia easy and wanted to know what she needed to do.   RA please advise. Verified pharmacy is [facility].

## 2018-01-09 ENCOUNTER — Other Ambulatory Visit: Payer: Self-pay | Admitting: *Deleted

## 2018-01-09 NOTE — Patient Outreach (Signed)
[facility]) Care Management  01/09/2018  Shelby Anderson 07-Dec-1955 [id number]   RN Health Coach telephone call to patient.  Hipaa compliance verified. Patient very hoarse and coughing. Per patient she is feeling bad. Patient stated she is trying to ward off pneumonia.  Plan:Patient and RN agreed for call back again within the next 10 days.  [place] Care Management [phone]

## 2018-01-10 ENCOUNTER — Other Ambulatory Visit: Payer: Self-pay | Admitting: *Deleted

## 2018-01-10 NOTE — Patient Outreach (Signed)
[place] [facility]) Care Management  01/10/2018  Shelby Anderson 12-Jun-1956 [id number]   Follow up phone call to patient regarding results of her court case and to assess for community resource needs. Patient sounded very ill over the phone, stating that she was "fighting pneumonia". Patient requested that I contact her back next week. Per patient, she has been in contact with her doctor and given antibiotics.   Plan: This Education officer, museum will call patient back within 1 week.    [name] [facility] Care Management [phone]

## 2018-01-14 ENCOUNTER — Other Ambulatory Visit: Payer: Self-pay | Admitting: *Deleted

## 2018-01-14 ENCOUNTER — Ambulatory Visit: Payer: Self-pay | Admitting: *Deleted

## 2018-01-14 NOTE — Patient Outreach (Signed)
[place] [facility]) Care Management  01/14/2018  Shelby Anderson Jan 02, 1956 [id number]   Follow up phone call to patient regarding results of her court case and to assess for community resource needs. Patient was not available to talk. This social worker's name and number provided requesting a return call.   [name] [facility] Care Management [phone]

## 2018-01-17 ENCOUNTER — Other Ambulatory Visit: Payer: Self-pay | Admitting: *Deleted

## 2018-01-17 NOTE — Patient Outreach (Signed)
[place] [facility]) Care Management  01/17/2018  GAIL VENDETTI [birth year] [id number]   Follow up phone call to patient regarding results of her court case and to assess for community resource needs. Patient  not available. HIPPA compliant voicemail mail message left requesting a return call.  This Education officer, museum will follow up with patient within 1 week.    [name] [facility] Care Management [phone]

## 2018-01-20 ENCOUNTER — Other Ambulatory Visit: Payer: Self-pay | Admitting: *Deleted

## 2018-01-20 ENCOUNTER — Ambulatory Visit: Payer: Self-pay | Admitting: *Deleted

## 2018-01-20 NOTE — Patient Outreach (Signed)
[place] [facility]) Care Management  01/20/2018  KASHISH YGLESIAS August 26, 1956 [id number]   Phone call to patient to follow up with patient regarding her recent court case and mental health needs. Per patient, she is feeling much better, her custody case has been continued to the first week of March. Per patient, she has her grandchildren during the week and their parents have them on the weekends. Patient frustrated regarding this process and reports that she is hoping the case will be resolved by next month's court date.  Mental health providers that accept Medicaid reviewed with patient . Per patient, she plans on making an appointment to assist her grandchildren through this period, which will in turn help to resolve some of the tension in the house. Patient verbalized feeling much better and reports having no additional community resource needs at this time.This social worker's contact information provide if future issues or concerns arise in the future.   [name] [facility] Care Management [phone]

## 2018-01-21 ENCOUNTER — Other Ambulatory Visit: Payer: Self-pay | Admitting: Pharmacist

## 2018-01-21 ENCOUNTER — Ambulatory Visit: Payer: Self-pay | Admitting: *Deleted

## 2018-01-21 NOTE — Patient Outreach (Signed)
[place] [facility]) Care Management  01/21/2018  Shelby Anderson [birth year] [id number]  Unsuccessful phone outreach attempt to patient to follow-up regarding patient reported concerns with medication cost.    No answer, and unable to leave voicemail.    Plan:  Will make second outreach attempt to patient in the next week.   Karrie Meres, PharmD, [place] [phone]

## 2018-01-22 ENCOUNTER — Other Ambulatory Visit: Payer: Self-pay | Admitting: Pharmacist

## 2018-01-22 NOTE — Patient Outreach (Signed)
[place] [facility]) Care Management  01/22/2018  Shelby Anderson 28-Oct-1956 [id number]  Second unsuccessful phone outreach to patient.  Gentleman answered call and stated patient not available.  No PHI was exchanged, and a call back was requested.    Plan:  As there has been no call back as of time of this note, will send outreach letter.   Final outreach attempt will be made in 10 business days.   Karrie Meres, PharmD, [place] [phone]

## 2018-01-23 DIAGNOSIS — J45909 Unspecified asthma, uncomplicated: Secondary | ICD-10-CM | POA: Diagnosis not present

## 2018-01-30 ENCOUNTER — Other Ambulatory Visit: Payer: Self-pay | Admitting: *Deleted

## 2018-01-30 DIAGNOSIS — Z79891 Long term (current) use of opiate analgesic: Secondary | ICD-10-CM | POA: Diagnosis not present

## 2018-01-30 DIAGNOSIS — S9422XS Injury of deep peroneal nerve at ankle and foot level, left leg, sequela: Secondary | ICD-10-CM | POA: Diagnosis not present

## 2018-01-30 DIAGNOSIS — G8929 Other chronic pain: Secondary | ICD-10-CM | POA: Diagnosis not present

## 2018-01-30 DIAGNOSIS — R079 Chest pain, unspecified: Secondary | ICD-10-CM | POA: Diagnosis not present

## 2018-01-30 DIAGNOSIS — I2781 Cor pulmonale (chronic): Secondary | ICD-10-CM | POA: Diagnosis not present

## 2018-01-30 DIAGNOSIS — I10 Essential (primary) hypertension: Secondary | ICD-10-CM | POA: Diagnosis not present

## 2018-01-30 DIAGNOSIS — G894 Chronic pain syndrome: Secondary | ICD-10-CM | POA: Diagnosis not present

## 2018-01-30 DIAGNOSIS — M47816 Spondylosis without myelopathy or radiculopathy, lumbar region: Secondary | ICD-10-CM | POA: Diagnosis not present

## 2018-02-05 ENCOUNTER — Other Ambulatory Visit: Payer: Self-pay | Admitting: Pharmacist

## 2018-02-05 ENCOUNTER — Other Ambulatory Visit: Payer: Self-pay | Admitting: Internal Medicine

## 2018-02-05 NOTE — Patient Outreach (Signed)
[place] [facility]) Care Management  02/05/2018  PERRIE RAGIN May 03, 1956 [id number]  Third unsuccessful phone outreach to patient.  Gentleman answered call and reports patient not available.  HIPAA compliant message left requesting patient return call.  Gentleman answering call did not take down phone number and stated it showed up on their phone.   An outreach letter was mailed to patient with no reply as well.   Plan:  Given inability to establish contact with patient, pharmacy episode will be closed.   [facility] RN Joaquim Lai updated via inbasket.   Karrie Meres, PharmD, [place] [phone]

## 2018-02-13 ENCOUNTER — Other Ambulatory Visit: Payer: Self-pay | Admitting: Internal Medicine

## 2018-02-20 ENCOUNTER — Encounter: Payer: Self-pay | Admitting: Podiatry

## 2018-02-20 ENCOUNTER — Ambulatory Visit: Payer: Medicare Other | Admitting: Podiatry

## 2018-02-20 DIAGNOSIS — E1151 Type 2 diabetes mellitus with diabetic peripheral angiopathy without gangrene: Secondary | ICD-10-CM | POA: Diagnosis not present

## 2018-02-20 DIAGNOSIS — B351 Tinea unguium: Secondary | ICD-10-CM | POA: Diagnosis not present

## 2018-02-20 DIAGNOSIS — J45909 Unspecified asthma, uncomplicated: Secondary | ICD-10-CM | POA: Diagnosis not present

## 2018-02-28 NOTE — Progress Notes (Signed)
  Subjective:  Patient ID: Shelby Anderson, female    DOB: 1956/01/05,  MRN: [id number]  Chief Complaint  Patient presents with  . Diabetes    nail trim  . Nail Problem    left hallux, stubbed toe, cracked nail x 1 week    62 y.o. female returns for diabetic foot care. Last AMBS unknown. Stubbed the left great toenail and the nail is cracked for the past week.  Denies other pedal issues.  Denies nausea vomiting fever chills.   Objective:   General AA&O x3. Normal mood and affect.  Vascular Dorsalis pedis pulses present 1+ bilaterally  Posterior tibial pulses absent bilaterally  Capillary refill normal to all digits. Pedal hair growth normal.  Neurologic Epicritic sensation present bilaterally. Protective sensation with 5.07 monofilament  present bilaterally. Vibratory sensation present bilaterally.  Dermatologic No open lesions. Interspaces clear of maceration.  Normal skin temperature and turgor. Hyperkeratotic lesions: None bilaterally. Nails: brittle, onychomycosis, thickening, elongation  Orthopedic: No history of amputation. MMT 5/5 in dorsiflexion, plantarflexion, inversion, and eversion. Normal lower extremity joint ROM without pain or crepitus.     Assessment & Plan:  Patient was evaluated and treated and all questions answered.  Diabetes with PAD, Onychomycosis -Educated on diabetic footcare. Diabetic risk level 1 -At risk foot care provided as below.  Procedure: Nail Debridement Rationale: Patient meets criteria for routine foot care due to Class B findings. Type of Debridement: manual, sharp debridement. Instrumentation: Nail nipper, rotary burr. Number of Nails: 10  Return in about 3 months (around 05/23/2018) for Diabetic Foot Care.

## 2018-03-21 ENCOUNTER — Other Ambulatory Visit: Payer: Self-pay | Admitting: Family Medicine

## 2018-03-23 DIAGNOSIS — J45909 Unspecified asthma, uncomplicated: Secondary | ICD-10-CM | POA: Diagnosis not present

## 2018-03-27 ENCOUNTER — Other Ambulatory Visit: Payer: Self-pay

## 2018-04-10 ENCOUNTER — Ambulatory Visit: Payer: Medicare Other | Admitting: Family Medicine

## 2018-04-10 DIAGNOSIS — Z0289 Encounter for other administrative examinations: Secondary | ICD-10-CM

## 2018-04-10 NOTE — Progress Notes (Deleted)
HPI:  Using dictation device. Unfortunately this device frequently misinterprets words/phrases.  Shelby Anderson is a pleasant 62 y.o. here for follow up. Chronic medical problems summarized below were reviewed for changes.  We added glipizide after her last lab check in January.  She also is considering getting some counseling.***. Denies CP, SOB, DOE, treatment intolerance or new symptoms. AWV 11/2017 Due for labs Past due on pap  Diabetes Mellitus: -borderline/mild diabetic for some time -takes cinnamon, glipizide added 12/2017 -no regular exercise, but she is active taking care of grandchildren -hx CAD (sees cardiologist), HLD, HTN, orthostattic hypotension - on statin and asa, not on acei - has asthma  GERD/constipation: -hx bad reflux, esophageal stricture s/p dilation and gastric ulcer -sees Dr. Henrene Pastor for this  -on nexium 40mg  bid, carafate, prn phenergan, colace  CAD hx MI x2 per her report/Arrythmia/HTN/HLD/orthostatic hypotension/presyncope: -seeing several cardiologists -meds: asa, metoprolol, fish oil, isosorbide, florinef, K+, lipitor, ntg prn -denies: CP, SOB, palpitations  Asthma/Allergies: -sees pulmonologist for this -meds: singulair, symbicort, albuterol, flonase  Insomnia: -uses benydrl occ for this -eszopiclone on med list from ? [facility] -hx of low O2 at night  Chronic back pain/Knee pain: -hx DDD and neuropathy, OA knees -seeing [facility] pain management specialist and referred to ortho -on daily oxycodone 20mg  3 times daily, lyrica, deplin, robaxin - doesn't think any of them help; reports pain doctor doesn't want her to stop them -on methadone in the past and opana - reports reacted to them -aware of risks with medications, adamant about continuing mobic as feels is only medication that helps  MDD: -reports on deplin from pain management for this, does not feel helps -declined other treatments offered here -Chronic symptoms: mildly  constant depressed mood and hopeless feelings at times, no SI, panic or manic symptoms  HRT/postmenopausl bleeding: -on prometrium with her gynecologist, Dr. Toney Rakes   ROS: See pertinent positives and negatives per HPI.  Past Medical History:  Diagnosis Date  . Allergy   . Anemia    "chronic"  . Angina   . Anxiety and depression   . Arthritis   . Asthma   . Atrial fibrillation ([place])    h/o "AF w/frequent PVCs"  . Cancer (HCC)    hx of skin cancer   . CHF (congestive heart failure) ([place])   . Chronic back pain greater than 3 months duration    on chronic narcotics, treated at pain clinic  . Colon polyps    hyperplastic  . Coronary artery disease    Arrythmia, orthostatic hypotension, HLD, HTN; sees Dr. Einar Gip  . Difficult intubation    "TMJ & woke up when they were still cutting on me"  . Dysrhythmia    sees Dr. Einar Gip and a cardiologist at [facility]  . Esophageal stricture   . Fatty liver   . Fibroids   . Fibromyalgia    "in my legs"  . GERD (gastroesophageal reflux disease)    hx hiatal hernia, stricture and gastric ulcer  . Headache(784.0)   . Heart murmur   . Hiatal hernia   . History of loop recorder   . History of migraines    "dx'd when I was in my teens"  . Hyperlipemia   . Hypertension   . Mental disorder   . Mild episode of recurrent major depressive disorder ([place]) 12/06/2015  . Myocardial infarction [facility]) 1980's & 1990;   sees Dr. Einar Gip  . Pneumonia   . PONV (postoperative nausea and vomiting)   . Recurrent  upper respiratory infection (URI)   . Shortness of breath 11/20/11   "all the time", sees pulmonlogy, ? asthma  . Stenotic cervical os   . Stomach ulcer    "3 small; found in 05/2011"  . TMJ (dislocation of temporomandibular joint)   . Tuberculosis    + TB SKIN TEST  . Type 2 diabetes mellitus without complication, without long-term current use of insulin ([place]) 12/06/2015   not on meds     Past Surgical History:  Procedure Laterality Date   . ACHILLES TENDON REPAIR  1970's   left ankle  . ARTHROSCOPIC REPAIR ACL     left knee cap  . BREAST BIOPSY Right 04/08/2013  . CARDIAC CATHETERIZATION     loop recorder  . CARPAL TUNNEL RELEASE  unknown   left hand  . ESOPHAGOGASTRODUODENOSCOPY (EGD) WITH PROPOFOL N/A 10/29/2017   Procedure: ESOPHAGOGASTRODUODENOSCOPY (EGD) WITH PROPOFOL;  Surgeon: Irene Shipper, MD;  Location: [facility];  Service: Endoscopy;  Laterality: N/A;  . LOOP RECORDER IMPLANT    . post ganglionectomy  1970's   "for migraine headaches"  . pouch string  [phone]   "did this 3 times (once w/each pregnancy)"  . SAVORY DILATION N/A 10/29/2017   Procedure: SAVORY DILATION;  Surgeon: Irene Shipper, MD;  Location: [facility];  Service: Endoscopy;  Laterality: N/A;  . TOTAL KNEE ARTHROPLASTY Left 09/25/2016   Procedure: LEFT TOTAL KNEE ARTHROPLASTY;  Surgeon: Paralee Cancel, MD;  Location: [facility];  Service: Orthopedics;  Laterality: Left;  . TUBAL LIGATION  1980's    Family History  Problem Relation Age of Onset  . Malignant hyperthermia Father   . Hypertension Father   . Heart disease Father   . Diabetes Father   . Cancer Father        skin  . Hypertension Mother   . Heart disease Mother   . Cancer Sister        CERVICAL  . Hypertension Sister   . Cancer Brother        MELANOMA  . Heart disease Maternal Grandmother   . Heart disease Maternal Grandfather   . Cancer Paternal Grandmother        ?   Marland Kitchen Heart disease Paternal Grandmother   . Heart disease Paternal Grandfather   . Cancer Brother        LUNG  . Diabetes Sister   . Hypertension Sister   . Heart disease Sister   . Cancer Sister   . Cancer Brother   . Anesthesia problems Neg Hx   . Hypotension Neg Hx   . Pseudochol deficiency Neg Hx   . Colon cancer Neg Hx   . Esophageal cancer Neg Hx   . Pancreatic cancer Neg Hx   . Rectal cancer Neg Hx   . Stomach cancer Neg Hx     SOCIAL HX: ***   Current Outpatient Medications:  .   albuterol (PROAIR HFA) 108 (90 Base) MCG/ACT inhaler, Inhale 2 puffs into the lungs every 4 hours as needed for shortness of breath, Disp: 8 g, Rfl: 3 .  albuterol (PROVENTIL) (2.5 MG/3ML) 0.083% nebulizer solution, Take 3 mLs (2.5 mg total) by nebulization every 6 (six) hours as needed for wheezing or shortness of breath (J45.40)., Disp: 75 mL, Rfl: 3 .  aspirin EC 81 MG tablet, Take 81 mg at bedtime by mouth. , Disp: , Rfl:  .  atorvastatin (LIPITOR) 40 MG tablet, TAKE 1 TABLET BY MOUTH EVERY DAY, Disp: 90 tablet, Rfl:  1 .  azithromycin (ZITHROMAX) 250 MG tablet, Take 2 today, then 1 daily until gone., Disp: 6 tablet, Rfl: 0 .  budesonide-formoterol (SYMBICORT) 80-4.5 MCG/ACT inhaler, INHALE 2 PUFFS INTO THE LUNGS FIRST THING IN THE AM AND THEN ANOTHER 2 PUFFS 12 HOURS LATER, Disp: 10.2 Inhaler, Rfl: 5 .  Calcium Carb-Cholecalciferol (CALCIUM 600+D) 600-800 MG-UNIT TABS, Take 1 tablet daily at 12 noon by mouth. , Disp: , Rfl:  .  CARAFATE 1 GM/10ML suspension, TAKE 10 MILLILITERS BY MOUTH 4 TIMES DAILY WITH MEALS AND AT BEDTIME, Disp: 420 mL, Rfl: 2 .  CINNAMON PO, Take 1,000 mg 2 (two) times daily by mouth., Disp: , Rfl:  .  Cyanocobalamin (VITAMIN B-12) 5000 MCG TBDP, Take 5,000 mcg by mouth daily. , Disp: , Rfl:  .  diphenhydrAMINE (BENADRYL) 25 MG tablet, Take 25 mg daily as needed by mouth (allergic reactions)., Disp: , Rfl:  .  docusate sodium (COLACE) 100 MG capsule, Take 1 capsule (100 mg total) by mouth 2 (two) times daily. (Patient taking differently: Take 100-200 mg by mouth daily as needed for mild constipation. ), Disp: 10 capsule, Rfl: 0 .  Eszopiclone (ESZOPICLONE) 3 MG TABS, Take 3 mg by mouth at bedtime as needed (insomnia). , Disp: , Rfl:  .  fluticasone (FLONASE) 50 MCG/ACT nasal spray, Place 2 sprays at bedtime as needed into both nostrils for allergies or rhinitis. , Disp: , Rfl:  .  glipiZIDE (GLUCOTROL) 5 MG tablet, Take 0.5 tablets (2.5 mg total) by mouth 2 (two) times daily  before a meal., Disp: 30 tablet, Rfl: 5 .  isosorbide mononitrate (IMDUR) 60 MG 24 hr tablet, TAKE 1 TABLET BY MOUTH EVERY DAY, Disp: 90 tablet, Rfl: 1 .  KLOR-CON M20 20 MEQ tablet, TAKE 1 TABLET BY MOUTH EVERY DAY, Disp: 30 tablet, Rfl: 4 .  L-Methylfolate (DEPLIN) 7.5 MG TABS, Take 7.5 mg by mouth daily with breakfast. , Disp: , Rfl:  .  LYRICA 75 MG capsule, Take 75 mg by mouth 3 (three) times daily. , Disp: , Rfl:  .  methocarbamol (ROBAXIN) 500 MG tablet, Take 1 tablet (500 mg total) by mouth every 6 (six) hours as needed for muscle spasms. (Patient taking differently: Take 500 mg 3 (three) times daily as needed by mouth for muscle spasms. ), Disp: 60 tablet, Rfl: 0 .  metoprolol tartrate (LOPRESSOR) 50 MG tablet, TAKE 1 TABLET TWICE A DAY, Disp: 180 tablet, Rfl: 1 .  Misc Natural Products (GLUCOS-CHONDROIT-MSM COMPLEX PO), Take 1 tablet 3 (three) times daily by mouth. , Disp: , Rfl:  .  montelukast (SINGULAIR) 10 MG tablet, TAKE 1 TABLET (10 MG TOTAL) BY MOUTH AT BEDTIME., Disp: 90 tablet, Rfl: 3 .  Multiple Vitamin (MULITIVITAMIN WITH MINERALS) TABS, Take 1 tablet by mouth daily.  , Disp: , Rfl:  .  NEXIUM 40 MG capsule, TAKE ONE CAPSULE BY MOUTH TWICE A DAY, Disp: 60 capsule, Rfl: 3 .  nitroGLYCERIN (NITROSTAT) 0.3 MG SL tablet, Place 0.3 mg under the tongue every 5 (five) minutes as needed for chest pain (as needed)., Disp: , Rfl:  .  NONFORMULARY OR COMPOUNDED ITEM, Estradiol .02% 1 ML Prefilled Applicator Sig: apply vaginally twice a week #90 Day Supply with 4 refills, Disp: 1 each, Rfl: 4 .  NONFORMULARY OR COMPOUNDED ITEM, [facility]:  Onychomycosis nail lacquer - fluconazole 2%, terbinafine 1%, DMSO apply to affected area daily., Disp: 120 each, Rfl: 2 .  Oxycodone HCl 20 MG TABS, Take 20 mg every  4 (four) hours as needed by mouth (pain). , Disp: , Rfl:  .  OXYGEN, Inhale 2 L into the lungs at bedtime., Disp: , Rfl:  .  progesterone (PROMETRIUM) 200 MG capsule, TAKE 1 CAPSULE BY  MOUTH EVERY DAY FOR FIRST 12 DAYS OF MONTH, Disp: 36 capsule, Rfl: 2 .  promethazine (PHENERGAN) 25 MG tablet, TAKE 1 TABLET BY MOUTH AT BEDTIME AS NEEDED FOR NAUSEA AND VOMITING, Disp: 30 tablet, Rfl: 1 .  triamcinolone cream (KENALOG) 0.1 %, APPLY 1 APPLICATION TOPICALLY 2 (TWO) TIMES DAILY., Disp: 80 g, Rfl: 1  EXAM:  There were no vitals filed for this visit.  There is no height or weight on file to calculate BMI.  GENERAL: vitals reviewed and listed above, alert, oriented, appears well hydrated and in no acute distress  HEENT: atraumatic, conjunttiva clear, no obvious abnormalities on inspection of external nose and ears  NECK: no obvious masses on inspection  LUNGS: clear to auscultation bilaterally, no wheezes, rales or rhonchi, good air movement  CV: HRRR, no peripheral edema  MS: moves all extremities without noticeable abnormality *** PSYCH: pleasant and cooperative, no obvious depression or anxiety  ASSESSMENT AND PLAN:  Discussed the following assessment and plan:  No diagnosis found.  *** -Patient advised to return or notify a doctor immediately if symptoms worsen or persist or new concerns arise.  There are no Patient Instructions on file for this visit.  Lucretia Kern, DO

## 2018-04-20 NOTE — Progress Notes (Signed)
HPI:  Using dictation device. Unfortunately this device frequently misinterprets words/phrases.  Shelby Anderson is a pleasant 62 y.o. here for follow up. Chronic medical problems summarized below were reviewed for changes. Added glipizide last visit.  Reports doing okay.  She has had a little wheezing at night for the last week.  She sees Dr. Elsworth Soho for asthma.  She has only used her albuterol a few times and symptoms that responded.  Reports she is taking her Symbicort.  Denies excessive sputum production, cough, fevers, shortness of breath, heart concerns, increased swelling or weight gain.  Uses oxygen at night -this is unchanged.  Reports she had a sleep study in the past with pulmonology and it was negative. Reports she does her Pap smears with her gynecologist.  Agrees to call to schedule this.  Her current gynecologist retired, but they are reassigning her to someone else in the practice.  AWV was 11/2017 with Manuela Schwartz  Diabetes Mellitus: -takes cinnamon, glipizide -no regular exercise, but she is active taking care of grandchildren -hx CAD (sees cardiologist), HLD, HTN, orthostattic hypotension - on statin and asa, not on acei - has asthma  GERD/constipation: -hx bad reflux, esophageal stricture s/p dilation and gastric ulcer -sees Dr. Henrene Pastor for this  -on nexium 40mg  bid, carafate, prn phenergan, colace  CAD hx MI x2 per her report/Arrythmia/HTN/HLD/orthostatic hypotension/presyncope: -seeing several cardiologists -meds: asa, statin, metoprolol, fish oil, isosorbid, K+, ntg prn -denies: CP, SOB, palpitations -on florinef in the past  Asthma/Allergies: -sees pulmonologist for this -meds: singulair, symbicort, albuterol, flonase  Insomnia: -uses benydrl occ for this -eszopiclone on med list from ? [facility] -reports she very rarely takes this, may be a few times per year. -hx of low O2 at night  Chronic back pain/Knee pain: -hx DDD and neuropathy, OA knees -seeing  [facility] specialist and referred to ortho -on daily oxycodone 20mg  3 times daily, lyrica, deplin, robaxin - doesn't think any of them help; reports pain doctor doesn't want her to stop them -on methadone in the past and opana - reports reacted to them -aware of risks with medications, adamant about continuing mobic as feels is only medication that helps  MDD: -reports on deplin from pain management for this, does not feel helps -declined other treatments offered here -Chronic symptoms: mildly constant depressed mood and hopeless feelings at times, no SI, panic or manic symptoms  HRT/postmenopausl bleeding: -on prometrium with her gynecologist, Dr. Toney Rakes   ROS: See pertinent positives and negatives per HPI.  Past Medical History:  Diagnosis Date  . Allergy   . Anemia    "chronic"  . Angina   . Anxiety and depression   . Arthritis   . Asthma   . Atrial fibrillation ([place])    h/o "AF w/frequent PVCs"  . Cancer (HCC)    hx of skin cancer   . CHF (congestive heart failure) ([place])   . Chronic back pain greater than 3 months duration    on chronic narcotics, treated at pain clinic  . Colon polyps    hyperplastic  . Coronary artery disease    Arrythmia, orthostatic hypotension, HLD, HTN; sees Dr. Einar Gip  . Difficult intubation    "TMJ & woke up when they were still cutting on me"  . Dysrhythmia    sees Dr. Einar Gip and a cardiologist at [facility]  . Esophageal stricture   . Fatty liver   . Fibroids   . Fibromyalgia    "in my legs"  . GERD (  gastroesophageal reflux disease)    hx hiatal hernia, stricture and gastric ulcer  . Headache(784.0)   . Heart murmur   . Hiatal hernia   . History of loop recorder   . History of migraines    "dx'd when I was in my teens"  . Hyperlipemia   . Hypertension   . Mental disorder   . Mild episode of recurrent major depressive disorder ([place]) 12/06/2015  . Myocardial infarction [facility]) 1980's & 1990;   sees Dr. Einar Gip  .  Pneumonia   . PONV (postoperative nausea and vomiting)   . Recurrent upper respiratory infection (URI)   . Shortness of breath 11/20/11   "all the time", sees pulmonlogy, ? asthma  . Stenotic cervical os   . Stomach ulcer    "3 small; found in 05/2011"  . TMJ (dislocation of temporomandibular joint)   . Tuberculosis    + TB SKIN TEST  . Type 2 diabetes mellitus without complication, without long-term current use of insulin ([place]) 12/06/2015   not on meds     Past Surgical History:  Procedure Laterality Date  . ACHILLES TENDON REPAIR  1970's   left ankle  . ARTHROSCOPIC REPAIR ACL     left knee cap  . BREAST BIOPSY Right 04/08/2013  . CARDIAC CATHETERIZATION     loop recorder  . CARPAL TUNNEL RELEASE  unknown   left hand  . ESOPHAGOGASTRODUODENOSCOPY (EGD) WITH PROPOFOL N/A 10/29/2017   Procedure: ESOPHAGOGASTRODUODENOSCOPY (EGD) WITH PROPOFOL;  Surgeon: Irene Shipper, MD;  Location: [facility];  Service: Endoscopy;  Laterality: N/A;  . LOOP RECORDER IMPLANT    . post ganglionectomy  1970's   "for migraine headaches"  . pouch string  [phone]   "did this 3 times (once w/each pregnancy)"  . SAVORY DILATION N/A 10/29/2017   Procedure: SAVORY DILATION;  Surgeon: Irene Shipper, MD;  Location: [facility];  Service: Endoscopy;  Laterality: N/A;  . TOTAL KNEE ARTHROPLASTY Left 09/25/2016   Procedure: LEFT TOTAL KNEE ARTHROPLASTY;  Surgeon: Paralee Cancel, MD;  Location: [facility];  Service: Orthopedics;  Laterality: Left;  . TUBAL LIGATION  1980's    Family History  Problem Relation Age of Onset  . Malignant hyperthermia Father   . Hypertension Father   . Heart disease Father   . Diabetes Father   . Cancer Father        skin  . Hypertension Mother   . Heart disease Mother   . Cancer Sister        CERVICAL  . Hypertension Sister   . Cancer Brother        MELANOMA  . Heart disease Maternal Grandmother   . Heart disease Maternal Grandfather   . Cancer Paternal Grandmother         ?   Marland Kitchen Heart disease Paternal Grandmother   . Heart disease Paternal Grandfather   . Cancer Brother        LUNG  . Diabetes Sister   . Hypertension Sister   . Heart disease Sister   . Cancer Sister   . Cancer Brother   . Anesthesia problems Neg Hx   . Hypotension Neg Hx   . Pseudochol deficiency Neg Hx   . Colon cancer Neg Hx   . Esophageal cancer Neg Hx   . Pancreatic cancer Neg Hx   . Rectal cancer Neg Hx   . Stomach cancer Neg Hx     SOCIAL HX: see hpi   Current Outpatient Medications:  .  albuterol (PROAIR HFA) 108 (90 Base) MCG/ACT inhaler, Inhale 2 puffs into the lungs every 4 hours as needed for shortness of breath, Disp: 8 g, Rfl: 3 .  albuterol (PROVENTIL) (2.5 MG/3ML) 0.083% nebulizer solution, Take 3 mLs (2.5 mg total) by nebulization every 6 (six) hours as needed for wheezing or shortness of breath (J45.40)., Disp: 75 mL, Rfl: 3 .  aspirin EC 81 MG tablet, Take 81 mg at bedtime by mouth. , Disp: , Rfl:  .  atorvastatin (LIPITOR) 40 MG tablet, TAKE 1 TABLET BY MOUTH EVERY DAY, Disp: 90 tablet, Rfl: 1 .  budesonide-formoterol (SYMBICORT) 80-4.5 MCG/ACT inhaler, INHALE 2 PUFFS INTO THE LUNGS FIRST THING IN THE AM AND THEN ANOTHER 2 PUFFS 12 HOURS LATER, Disp: 10.2 Inhaler, Rfl: 5 .  Calcium Carb-Cholecalciferol (CALCIUM 600+D) 600-800 MG-UNIT TABS, Take 1 tablet daily at 12 noon by mouth. , Disp: , Rfl:  .  CARAFATE 1 GM/10ML suspension, TAKE 10 MILLILITERS BY MOUTH 4 TIMES DAILY WITH MEALS AND AT BEDTIME, Disp: 420 mL, Rfl: 2 .  CINNAMON PO, Take 1,000 mg 2 (two) times daily by mouth., Disp: , Rfl:  .  Cyanocobalamin (VITAMIN B-12) 5000 MCG TBDP, Take 5,000 mcg by mouth daily. , Disp: , Rfl:  .  diphenhydrAMINE (BENADRYL) 25 MG tablet, Take 25 mg daily as needed by mouth (allergic reactions)., Disp: , Rfl:  .  docusate sodium (COLACE) 100 MG capsule, Take 1 capsule (100 mg total) by mouth 2 (two) times daily. (Patient taking differently: Take 100-200 mg by mouth daily  as needed for mild constipation. ), Disp: 10 capsule, Rfl: 0 .  Eszopiclone (ESZOPICLONE) 3 MG TABS, Take 3 mg by mouth at bedtime as needed (insomnia). , Disp: , Rfl:  .  fluticasone (FLONASE) 50 MCG/ACT nasal spray, Place 2 sprays at bedtime as needed into both nostrils for allergies or rhinitis. , Disp: , Rfl:  .  glipiZIDE (GLUCOTROL) 5 MG tablet, Take 0.5 tablets (2.5 mg total) by mouth 2 (two) times daily before a meal., Disp: 30 tablet, Rfl: 5 .  isosorbide mononitrate (IMDUR) 60 MG 24 hr tablet, TAKE 1 TABLET BY MOUTH EVERY DAY, Disp: 90 tablet, Rfl: 1 .  KLOR-CON M20 20 MEQ tablet, TAKE 1 TABLET BY MOUTH EVERY DAY, Disp: 30 tablet, Rfl: 4 .  L-Methylfolate (DEPLIN) 7.5 MG TABS, Take 7.5 mg by mouth daily with breakfast. , Disp: , Rfl:  .  LYRICA 75 MG capsule, Take 75 mg by mouth 3 (three) times daily. , Disp: , Rfl:  .  methocarbamol (ROBAXIN) 500 MG tablet, Take 1 tablet (500 mg total) by mouth every 6 (six) hours as needed for muscle spasms. (Patient taking differently: Take 500 mg 3 (three) times daily as needed by mouth for muscle spasms. ), Disp: 60 tablet, Rfl: 0 .  metoprolol tartrate (LOPRESSOR) 50 MG tablet, TAKE 1 TABLET TWICE A DAY, Disp: 180 tablet, Rfl: 1 .  Misc Natural Products (GLUCOS-CHONDROIT-MSM COMPLEX PO), Take 1 tablet 3 (three) times daily by mouth. , Disp: , Rfl:  .  montelukast (SINGULAIR) 10 MG tablet, TAKE 1 TABLET (10 MG TOTAL) BY MOUTH AT BEDTIME., Disp: 90 tablet, Rfl: 3 .  Multiple Vitamin (MULITIVITAMIN WITH MINERALS) TABS, Take 1 tablet by mouth daily.  , Disp: , Rfl:  .  NEXIUM 40 MG capsule, TAKE ONE CAPSULE BY MOUTH TWICE A DAY, Disp: 60 capsule, Rfl: 3 .  nitroGLYCERIN (NITROSTAT) 0.3 MG SL tablet, Place 0.3 mg under the tongue every 5 (five)  minutes as needed for chest pain (as needed)., Disp: , Rfl:  .  NONFORMULARY OR COMPOUNDED ITEM, Estradiol .02% 1 ML Prefilled Applicator Sig: apply vaginally twice a week #90 Day Supply with 4 refills, Disp: 1 each,  Rfl: 4 .  NONFORMULARY OR COMPOUNDED ITEM, [facility]:  Onychomycosis nail lacquer - fluconazole 2%, terbinafine 1%, DMSO apply to affected area daily., Disp: 120 each, Rfl: 2 .  Oxycodone HCl 20 MG TABS, Take 20 mg every 4 (four) hours as needed by mouth (pain). , Disp: , Rfl:  .  OXYGEN, Inhale 2 L into the lungs at bedtime., Disp: , Rfl:  .  progesterone (PROMETRIUM) 200 MG capsule, TAKE 1 CAPSULE BY MOUTH EVERY DAY FOR FIRST 12 DAYS OF MONTH, Disp: 36 capsule, Rfl: 2 .  promethazine (PHENERGAN) 25 MG tablet, TAKE 1 TABLET BY MOUTH AT BEDTIME AS NEEDED FOR NAUSEA AND VOMITING, Disp: 30 tablet, Rfl: 1 .  triamcinolone cream (KENALOG) 0.1 %, APPLY 1 APPLICATION TOPICALLY 2 (TWO) TIMES DAILY., Disp: 80 g, Rfl: 1  EXAM:  Vitals:   04/21/18 1103  BP: 100/70  Pulse: (!) 56  Temp: 98.4 F (36.9 C)  SpO2: 98%    Body mass index is 38.14 kg/m.  GENERAL: vitals reviewed and listed above, alert, oriented, appears well hydrated and in no acute distress  HEENT: atraumatic, conjunttiva clear, no obvious abnormalities on inspection of external nose and ears  NECK: no obvious masses on inspection  LUNGS: clear to auscultation bilaterally, no wheezes, rales or rhonchi, good air movement  CV: HRRR, no peripheral edema  MS: moves all extremities without noticeable abnormality  PSYCH: pleasant and cooperative, no obvious depression or anxiety  ASSESSMENT AND PLAN:  Discussed the following assessment and plan:  Type 2 diabetes mellitus without complication, without long-term current use of insulin (HCC) - Plan: Hemoglobin A1c  Hyperlipidemia associated with type 2 diabetes mellitus ([place]) - Plan: Lipid panel  Hypertension associated with diabetes ([place]) - Plan: Basic metabolic panel, CBC  Chronic respiratory failure with hypoxia (HCC)  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  Dilated cardiomyopathy ([place])  Coronary artery disease involving native coronary artery of native  heart without angina pectoris  -labs per orders -foot exam done -alb prn and pulm follow up advised if any worsening of symptoms or resp symptoms persist or other resp concerns -lifestyle recs -due for pap, advised pt and she plans to call her gyn office -Patient advised to return or notify a doctor immediately if symptoms worsen or persist or new concerns arise.  Patient Instructions  BEFORE YOU LEAVE: -labs -follow up: 3-4 months  CALL your gynecology office TODAY to schedule your pap smear.  Use your albuterol as needed and call your pulmonologist if any worsening in symptoms, difficulty breathing, symptoms not responding to albuterol or requiring albuterol frequently or other concerns.  We have ordered labs or studies at this visit. It can take up to 1-2 weeks for results and processing. IF results require follow up or explanation, we will call you with instructions. Clinically stable results will be released to your [facility]. If you have not heard from [place] or cannot find your results in [facility] in 2 weeks please contact our office at [phone].  If you are not yet signed up for [facility], please consider signing up.   We recommend the following healthy lifestyle for LIFE: 1) Small portions. But, make sure to get regular (at least 3 per day), healthy meals and small healthy snacks if  needed.  2) Eat a healthy clean diet.   TRY TO EAT: -at least 5-7 servings of low sugar, colorful, and nutrient rich vegetables per day (not corn, potatoes or bananas.) -berries are the best choice if you wish to eat fruit (only eat small amounts if trying to reduce weight)  -lean meets (fish, white meat of chicken or [place]) -vegan proteins for some meals - beans or tofu, whole grains, nuts and seeds -Replace bad fats with good fats - good fats include: fish, nuts and seeds, canola oil, olive oil -small amounts of low fat or non fat dairy -small amounts of100 % whole grains - check the  lables -drink plenty of water  AVOID: -SUGAR, sweets, anything with added sugar, corn syrup or sweeteners - must read labels as even foods advertised as "healthy" often are loaded with sugar -if you must have a sweetener, small amounts of stevia may be best -sweetened beverages and artificially sweetened beverages -simple starches (rice, bread, potatoes, pasta, chips, etc - small amounts of 100% whole grains are ok) -red meat, pork, butter -fried foods, fast food, processed food, excessive dairy, eggs and coconut.  3)Get at least 150 minutes of sweaty aerobic exercise per week.  4)Reduce stress - consider counseling, meditation and relaxation to balance other aspects of your life.           Lucretia Kern, DO

## 2018-04-21 ENCOUNTER — Encounter: Payer: Self-pay | Admitting: Family Medicine

## 2018-04-21 ENCOUNTER — Ambulatory Visit (INDEPENDENT_AMBULATORY_CARE_PROVIDER_SITE_OTHER): Payer: Medicare Other | Admitting: Family Medicine

## 2018-04-21 VITALS — BP 100/70 | HR 56 | Temp 98.4°F | Ht 66.0 in | Wt 236.3 lb

## 2018-04-21 DIAGNOSIS — I420 Dilated cardiomyopathy: Secondary | ICD-10-CM

## 2018-04-21 DIAGNOSIS — E785 Hyperlipidemia, unspecified: Secondary | ICD-10-CM | POA: Diagnosis not present

## 2018-04-21 DIAGNOSIS — E1159 Type 2 diabetes mellitus with other circulatory complications: Secondary | ICD-10-CM

## 2018-04-21 DIAGNOSIS — J9611 Chronic respiratory failure with hypoxia: Secondary | ICD-10-CM

## 2018-04-21 DIAGNOSIS — E1169 Type 2 diabetes mellitus with other specified complication: Secondary | ICD-10-CM | POA: Diagnosis not present

## 2018-04-21 DIAGNOSIS — J45991 Cough variant asthma: Secondary | ICD-10-CM | POA: Diagnosis not present

## 2018-04-21 DIAGNOSIS — E119 Type 2 diabetes mellitus without complications: Secondary | ICD-10-CM

## 2018-04-21 DIAGNOSIS — I10 Essential (primary) hypertension: Secondary | ICD-10-CM

## 2018-04-21 DIAGNOSIS — I2510 Atherosclerotic heart disease of native coronary artery without angina pectoris: Secondary | ICD-10-CM

## 2018-04-21 LAB — CBC
HCT: 39.7 % (ref 36.0–46.0)
Hemoglobin: 12.9 g/dL (ref 12.0–15.0)
MCHC: 32.5 g/dL (ref 30.0–36.0)
MCV: 88.4 fl (ref 78.0–100.0)
Platelets: 253 10*3/uL (ref 150.0–400.0)
RBC: 4.5 Mil/uL (ref 3.87–5.11)
RDW: 14.8 % (ref 11.5–15.5)
WBC: 6 10*3/uL (ref 4.0–10.5)

## 2018-04-21 LAB — BASIC METABOLIC PANEL
BUN: 9 mg/dL (ref 6–23)
CALCIUM: 9 mg/dL (ref 8.4–10.5)
CO2: 35 mEq/L — ABNORMAL HIGH (ref 19–32)
CREATININE: 0.55 mg/dL (ref 0.40–1.20)
Chloride: 101 mEq/L (ref 96–112)
GFR: 119.79 mL/min (ref >60.00)
GLUCOSE: 136 mg/dL — AB (ref 70–99)
Potassium: 4.3 mEq/L (ref 3.5–5.1)
Sodium: 141 mEq/L (ref 135–145)

## 2018-04-21 LAB — LIPID PANEL
CHOLESTEROL: 117 mg/dL (ref 0–200)
HDL: 38.2 mg/dL — AB (ref >39.00)
LDL Cholesterol: 59 mg/dL (ref 0–99)
NONHDL: 78.84
Total CHOL/HDL Ratio: 3
Triglycerides: 98 mg/dL (ref 0.0–149.0)
VLDL: 19.6 mg/dL (ref 0.0–40.0)

## 2018-04-21 LAB — HEMOGLOBIN A1C: Hgb A1c MFr Bld: 7.1 % — ABNORMAL HIGH (ref 4.6–6.5)

## 2018-04-21 NOTE — Patient Instructions (Signed)
BEFORE YOU LEAVE: -labs -follow up: 3-4 months  CALL your gynecology office TODAY to schedule your pap smear.  Use your albuterol as needed and call your pulmonologist if any worsening in symptoms, difficulty breathing, symptoms not responding to albuterol or requiring albuterol frequently or other concerns.  We have ordered labs or studies at this visit. It can take up to 1-2 weeks for results and processing. IF results require follow up or explanation, we will call you with instructions. Clinically stable results will be released to your [facility]. If you have not heard from [place] or cannot find your results in [facility] in 2 weeks please contact our office at [phone].  If you are not yet signed up for [facility], please consider signing up.   We recommend the following healthy lifestyle for LIFE: 1) Small portions. But, make sure to get regular (at least 3 per day), healthy meals and small healthy snacks if needed.  2) Eat a healthy clean diet.   TRY TO EAT: -at least 5-7 servings of low sugar, colorful, and nutrient rich vegetables per day (not corn, potatoes or bananas.) -berries are the best choice if you wish to eat fruit (only eat small amounts if trying to reduce weight)  -lean meets (fish, white meat of chicken or [place]) -vegan proteins for some meals - beans or tofu, whole grains, nuts and seeds -Replace bad fats with good fats - good fats include: fish, nuts and seeds, canola oil, olive oil -small amounts of low fat or non fat dairy -small amounts of100 % whole grains - check the lables -drink plenty of water  AVOID: -SUGAR, sweets, anything with added sugar, corn syrup or sweeteners - must read labels as even foods advertised as "healthy" often are loaded with sugar -if you must have a sweetener, small amounts of stevia may be best -sweetened beverages and artificially sweetened beverages -simple starches (rice, bread, potatoes, pasta, chips, etc - small amounts of 100% whole  grains are ok) -red meat, pork, butter -fried foods, fast food, processed food, excessive dairy, eggs and coconut.  3)Get at least 150 minutes of sweaty aerobic exercise per week.  4)Reduce stress - consider counseling, meditation and relaxation to balance other aspects of your life.

## 2018-04-22 DIAGNOSIS — J45909 Unspecified asthma, uncomplicated: Secondary | ICD-10-CM | POA: Diagnosis not present

## 2018-04-24 DIAGNOSIS — I50810 Right heart failure, unspecified: Secondary | ICD-10-CM | POA: Diagnosis not present

## 2018-04-24 DIAGNOSIS — M47816 Spondylosis without myelopathy or radiculopathy, lumbar region: Secondary | ICD-10-CM | POA: Diagnosis not present

## 2018-04-24 DIAGNOSIS — R55 Syncope and collapse: Secondary | ICD-10-CM | POA: Diagnosis not present

## 2018-04-24 DIAGNOSIS — Z4509 Encounter for adjustment and management of other cardiac device: Secondary | ICD-10-CM | POA: Diagnosis not present

## 2018-04-24 DIAGNOSIS — Z79891 Long term (current) use of opiate analgesic: Secondary | ICD-10-CM | POA: Diagnosis not present

## 2018-04-24 DIAGNOSIS — I110 Hypertensive heart disease with heart failure: Secondary | ICD-10-CM | POA: Diagnosis not present

## 2018-04-24 DIAGNOSIS — S9422XS Injury of deep peroneal nerve at ankle and foot level, left leg, sequela: Secondary | ICD-10-CM | POA: Diagnosis not present

## 2018-04-24 DIAGNOSIS — G894 Chronic pain syndrome: Secondary | ICD-10-CM | POA: Diagnosis not present

## 2018-04-24 DIAGNOSIS — K219 Gastro-esophageal reflux disease without esophagitis: Secondary | ICD-10-CM | POA: Diagnosis not present

## 2018-04-24 DIAGNOSIS — Z95818 Presence of other cardiac implants and grafts: Secondary | ICD-10-CM | POA: Diagnosis not present

## 2018-04-24 DIAGNOSIS — I493 Ventricular premature depolarization: Secondary | ICD-10-CM | POA: Diagnosis not present

## 2018-04-25 DIAGNOSIS — Z4509 Encounter for adjustment and management of other cardiac device: Secondary | ICD-10-CM | POA: Diagnosis not present

## 2018-04-25 DIAGNOSIS — R55 Syncope and collapse: Secondary | ICD-10-CM | POA: Diagnosis not present

## 2018-04-30 MED ORDER — METFORMIN HCL 500 MG PO TABS: 500.0000 mg | ORAL_TABLET | Freq: Every day | ORAL | 1 refills | Status: DC

## 2018-04-30 NOTE — Addendum Note (Signed)
Addended by: Lahoma Crocker A on: 04/30/2018 08:00 AM   Modules accepted: Orders

## 2018-05-01 ENCOUNTER — Other Ambulatory Visit: Payer: Self-pay | Admitting: Internal Medicine

## 2018-05-07 ENCOUNTER — Other Ambulatory Visit: Payer: Self-pay | Admitting: Internal Medicine

## 2018-05-09 ENCOUNTER — Other Ambulatory Visit: Payer: Self-pay | Admitting: *Deleted

## 2018-05-09 NOTE — Patient Outreach (Signed)
[facility] [place]) Care Management  05/09/2018  JAHARA DAIL 1956-11-12 [id number]   RN Health Coach attempted #1 follow up outreach call to patient.  Patient was unavailable. HIPPA compliance voicemail message left with return callback number.  Plan: RN will call patient again within 10 business days.  [place] [facility] [phone]

## 2018-05-14 ENCOUNTER — Encounter: Payer: Self-pay | Admitting: *Deleted

## 2018-05-14 NOTE — Telephone Encounter (Signed)
This encounter was created in error - please disregard.  This encounter was created in error - please disregard.

## 2018-05-14 NOTE — Addendum Note (Signed)
Addended by: Verlin Grills on: 05/14/2018 08:27 AM   Modules accepted: Level of Service, SmartSet

## 2018-05-14 NOTE — Patient Outreach (Signed)
[facility]) Care Management  [id number]  Shelby Anderson Nov 16, 1956 [id number]  RN Health Coach telephone received return call from patient.  Hipaa compliance verified. Per patient she is a diabetic. Patient stated she does not have a meter and does not check blood sugars. Patient states that she may feel a little weak when blood sugar drops but has not had any symptoms that she knows of of high blood sugar. Patient stated she takes care of grandchildren and gets exercise. No routine exercise program. Patient ambulates with a cane sometimes and uses a walker for distance. Per patient she has fallen recently and has bruise, no major injuries. Patient stated her appetite is not good. She stated she is nauseated all the time. She feels like she is going to vomit but doesn't. Patient is taking Nexium for reflux. Patient has agreed to further outreach calls.    Objective:   Current Medications:  Current Outpatient Medications  Medication Sig Dispense Refill  . albuterol (PROAIR HFA) 108 (90 Base) MCG/ACT inhaler Inhale 2 puffs into the lungs every 4 hours as needed for shortness of breath 8 g 3  . albuterol (PROVENTIL) (2.5 MG/3ML) 0.083% nebulizer solution Take 3 mLs (2.5 mg total) by nebulization every 6 (six) hours as needed for wheezing or shortness of breath (J45.40). 75 mL 3  . aspirin EC 81 MG tablet Take 81 mg at bedtime by mouth.     Marland Kitchen atorvastatin (LIPITOR) 40 MG tablet TAKE 1 TABLET BY MOUTH EVERY DAY 90 tablet 1  . budesonide-formoterol (SYMBICORT) 80-4.5 MCG/ACT inhaler INHALE 2 PUFFS INTO THE LUNGS FIRST THING IN THE AM AND THEN ANOTHER 2 PUFFS 12 HOURS LATER 10.2 Inhaler 5  . Calcium Carb-Cholecalciferol (CALCIUM 600+D) 600-800 MG-UNIT TABS Take 1 tablet daily at 12 noon by mouth.     Marland Kitchen CARAFATE 1 GM/10ML suspension TAKE 10 MILLILITERS BY MOUTH 4 TIMES DAILY WITH MEALS AND AT BEDTIME 420 mL 2  . CINNAMON PO Take 1,000 mg 2 (two) times daily by mouth.    .  Cyanocobalamin (VITAMIN B-12) 5000 MCG TBDP Take 5,000 mcg by mouth daily.     . diphenhydrAMINE (BENADRYL) 25 MG tablet Take 25 mg daily as needed by mouth (allergic reactions).    . docusate sodium (COLACE) 100 MG capsule Take 1 capsule (100 mg total) by mouth 2 (two) times daily. (Patient taking differently: Take 100-200 mg by mouth daily as needed for mild constipation. ) 10 capsule 0  . Eszopiclone (ESZOPICLONE) 3 MG TABS Take 3 mg by mouth at bedtime as needed (insomnia).     . fluticasone (FLONASE) 50 MCG/ACT nasal spray Place 2 sprays at bedtime as needed into both nostrils for allergies or rhinitis.     Marland Kitchen glipiZIDE (GLUCOTROL) 5 MG tablet Take 0.5 tablets (2.5 mg total) by mouth 2 (two) times daily before a meal. 30 tablet 5  . isosorbide mononitrate (IMDUR) 60 MG 24 hr tablet TAKE 1 TABLET BY MOUTH EVERY DAY 90 tablet 1  . KLOR-CON M20 20 MEQ tablet TAKE 1 TABLET BY MOUTH EVERY DAY 30 tablet 4  . L-Methylfolate (DEPLIN) 7.5 MG TABS Take 7.5 mg by mouth daily with breakfast.     . LYRICA 75 MG capsule Take 75 mg by mouth 3 (three) times daily.     . metFORMIN (GLUCOPHAGE) 500 MG tablet Take 1 tablet (500 mg total) by mouth daily with breakfast. 90 tablet 1  . methocarbamol (ROBAXIN) 500 MG tablet Take 1 tablet (500  mg total) by mouth every 6 (six) hours as needed for muscle spasms. (Patient taking differently: Take 500 mg 3 (three) times daily as needed by mouth for muscle spasms. ) 60 tablet 0  . metoprolol tartrate (LOPRESSOR) 50 MG tablet TAKE 1 TABLET TWICE A DAY 180 tablet 1  . Misc Natural Products (GLUCOS-CHONDROIT-MSM COMPLEX PO) Take 1 tablet 3 (three) times daily by mouth.     . montelukast (SINGULAIR) 10 MG tablet TAKE 1 TABLET (10 MG TOTAL) BY MOUTH AT BEDTIME. 90 tablet 3  . Multiple Vitamin (MULITIVITAMIN WITH MINERALS) TABS Take 1 tablet by mouth daily.      Marland Kitchen NEXIUM 40 MG capsule TAKE ONE CAPSULE BY MOUTH TWICE A DAY 60 capsule 3  . nitroGLYCERIN (NITROSTAT) 0.3 MG SL tablet  Place 0.3 mg under the tongue every 5 (five) minutes as needed for chest pain (as needed).    . NONFORMULARY OR COMPOUNDED ITEM Estradiol .02% 1 ML Prefilled Applicator Sig: apply vaginally twice a week #90 Day Supply with 4 refills 1 each 4  . NONFORMULARY OR COMPOUNDED ITEM [facility]:  Onychomycosis nail lacquer - fluconazole 2%, terbinafine 1%, DMSO apply to affected area daily. 120 each 2  . Oxycodone HCl 20 MG TABS Take 20 mg every 4 (four) hours as needed by mouth (pain).     . OXYGEN Inhale 2 L into the lungs at bedtime.    . progesterone (PROMETRIUM) 200 MG capsule TAKE 1 CAPSULE BY MOUTH EVERY DAY FOR FIRST 12 DAYS OF MONTH 36 capsule 2  . promethazine (PHENERGAN) 25 MG tablet TAKE 1 TABLET BY MOUTH AT BEDTIME AS NEEDED FOR NAUSEA AND VOMITING 30 tablet 1  . triamcinolone cream (KENALOG) 0.1 % APPLY 1 APPLICATION TOPICALLY 2 (TWO) TIMES DAILY. 80 g 1   No current facility-administered medications for this visit.     Functional Status:  In your present state of health, do you have any difficulty performing the following activities: 05/09/2018 11/29/2017  Hearing? N N  Vision? - N  Difficulty concentrating or making decisions? - N  Comment - just forgets things   Walking or climbing stairs? - N  Dressing or bathing? - N  Doing errands, shopping? - N  Using the Toilet? - N  In the past six months, have you accidently leaked urine? - N  Do you have problems with loss of bowel control? - N  Comment - stomach issues   Managing your Medications? - N  Managing your Finances? - N  Housekeeping or managing your Housekeeping? - N  Some recent data might be hidden    Fall/Depression Screening: Fall Risk  05/09/2018 12/26/2017 11/29/2017  Falls in the past year? Yes Yes No  Number falls in past yr: 2 or more 2 or more -  Injury with Fall? No No -  Comment bruising - -  Risk Factor Category  High Fall Risk - -  Risk for fall due to : History of fall(s);Impaired  balance/gait;Impaired mobility - -  Follow up Falls evaluation completed;Education provided;Falls prevention discussed - -  Comment - - -   PHQ 2/9 Scores 05/09/2018 12/26/2017 11/29/2017 12/28/2016 11/15/2016 11/08/2016 01/05/2016  PHQ - 2 Score 3 3 4 2 6 6 6   PHQ- 9 Score 12 12 9 8 20 15 22    THN CM Care Plan Problem One     Most Recent Value  Care Plan Problem One  Knowledge Deficit in Self Management of Diabetes  Role Documenting the Problem One  [place] for Problem One  Active  [facility] Long Term Goal   Patient will have a better understanding of diabetes within the next 90 days  THN Long Term Goal Start Date  05/14/18  Interventions for Problem One Long Term Goal  RN discussed signs and symptoms of hypo and hyperglycemia. RN discussed A1C. RN sent educational book on Living Well with Diabetes. RN will follow up with further discussion and teach back  THN CM Short Term Goal #1   Patient will understand the signs and symptoms of hypo and hyperglycemia  THN CM Short Term Goal #1 Start Date  05/09/18  Interventions for Short Term Goal #1  RN discussed the patient signs and symptoms of hypo and hyperglycemia. RN sent educational material on hypo and hyperglycemia. RN will follow up with further discussion an teach back  THN CM Short Term Goal #2   Patient will see a decrease in A1C from 7.1  THN CM Short Term Goal #2 Start Date  05/09/18  Interventions for Short Term Goal #2  RN discussed what the A1C is. RN discussed eating healthy. RN will follow up with further discussion  THN CM Short Term Goal #3  Patient will not have any falls within the next 30 days  THN CM Short Term Goal #3 Start Date  05/09/18  Interventions for Short Tern Goal #3  RN discussed fall prevention. RN sent EMMI educational material on falls prevention. RN sent educational material on how to get up from a fall.      Assessment:  Patient does not have a meter. Patient does not check blood sugars Patient has  had recent fall Patient is having frequent episodes of nausea Plan:  RN discussed hypo and hyperglycemia symptoms RN discussed A1C RN sent EMMI educational  material on A1C RN sent EMMI educational  material  On hypo and hyperglycemia RN sent EMMI educational  material  Fall prevention and how to get up RN sent Living well with diabetes RN sent 2019 Calendar Book RN sent barriers letter and assessment to physican RN will follow up within the month of July

## 2018-05-14 NOTE — Telephone Encounter (Signed)
This encounter was created in error - please disregard.

## 2018-05-22 ENCOUNTER — Ambulatory Visit: Payer: Medicare Other | Admitting: Podiatry

## 2018-05-23 DIAGNOSIS — J45909 Unspecified asthma, uncomplicated: Secondary | ICD-10-CM | POA: Diagnosis not present

## 2018-06-05 ENCOUNTER — Other Ambulatory Visit: Payer: Self-pay | Admitting: Internal Medicine

## 2018-06-05 ENCOUNTER — Other Ambulatory Visit: Payer: Self-pay | Admitting: Family Medicine

## 2018-06-12 ENCOUNTER — Ambulatory Visit: Payer: Medicare Other | Admitting: Podiatry

## 2018-06-12 ENCOUNTER — Other Ambulatory Visit: Payer: Self-pay | Admitting: *Deleted

## 2018-06-12 ENCOUNTER — Encounter: Payer: Self-pay | Admitting: Podiatry

## 2018-06-12 DIAGNOSIS — B351 Tinea unguium: Secondary | ICD-10-CM | POA: Diagnosis not present

## 2018-06-12 DIAGNOSIS — E1151 Type 2 diabetes mellitus with diabetic peripheral angiopathy without gangrene: Secondary | ICD-10-CM | POA: Diagnosis not present

## 2018-06-12 NOTE — Patient Outreach (Signed)
[facility]) Care Management  06/12/2018  Shelby Anderson [birth year] [id number]   RN Health Coach attempted follow up outreach call to patient.  Patient was unavailable. HIPPA compliance voicemail message left with return callback number.  Plan: RN will call patient again within 10 business days.  [place] Care Management [phone]

## 2018-06-12 NOTE — Progress Notes (Signed)
  Subjective:  Patient ID: JOHNATHAN TORTORELLI, female    DOB: Oct 16, 1956,  MRN: [id number]  Chief Complaint  Patient presents with  . Diabetic Foot Exam    foot and nail exam; pt stated, "nail on big toe left foot hurts"   62 y.o. female returns for diabetic foot care.  Unknown last and blood sugar.  Reports pain to both big toenails. Objective:   General AA&O x3. Normal mood and affect.  Vascular Dorsalis pedis pulses present 1+ bilaterally  Posterior tibial pulses absent bilaterally  Capillary refill normal to all digits. Pedal hair growth normal.  Neurologic Epicritic sensation present bilaterally. Protective sensation with 5.07 monofilament  present bilaterally. Vibratory sensation present bilaterally.  Dermatologic No open lesions. Interspaces clear of maceration.  Normal skin temperature and turgor. Hyperkeratotic lesions: None bilaterally. Nails: brittle, onychomycosis, thickening, elongation  Orthopedic: No history of amputation. MMT 5/5 in dorsiflexion, plantarflexion, inversion, and eversion. Normal lower extremity joint ROM without pain or crepitus.     Assessment & Plan:  Patient was evaluated and treated and all questions answered.  Diabetes with PAD, Onychomycosis -Educated on diabetic footcare. Diabetic risk level 1 -At risk foot care provided as below.  Procedure: Nail Debridement Rationale: Patient meets criteria for routine foot care due to Class B findings. Type of Debridement: manual, sharp debridement. Instrumentation: Nail nipper, rotary burr. Number of Nails: 10     Return in about 3 months (around 09/12/2018) for Diabetic Foot Care.

## 2018-06-15 ENCOUNTER — Other Ambulatory Visit: Payer: Self-pay | Admitting: Family Medicine

## 2018-06-15 ENCOUNTER — Other Ambulatory Visit: Payer: Self-pay | Admitting: Adult Health

## 2018-06-22 DIAGNOSIS — J45909 Unspecified asthma, uncomplicated: Secondary | ICD-10-CM | POA: Diagnosis not present

## 2018-06-23 DIAGNOSIS — M47816 Spondylosis without myelopathy or radiculopathy, lumbar region: Secondary | ICD-10-CM | POA: Diagnosis not present

## 2018-06-23 DIAGNOSIS — S9422XS Injury of deep peroneal nerve at ankle and foot level, left leg, sequela: Secondary | ICD-10-CM | POA: Diagnosis not present

## 2018-06-23 DIAGNOSIS — Z79891 Long term (current) use of opiate analgesic: Secondary | ICD-10-CM | POA: Diagnosis not present

## 2018-06-23 DIAGNOSIS — G894 Chronic pain syndrome: Secondary | ICD-10-CM | POA: Diagnosis not present

## 2018-06-27 ENCOUNTER — Ambulatory Visit: Payer: Self-pay | Admitting: *Deleted

## 2018-06-30 ENCOUNTER — Encounter

## 2018-06-30 ENCOUNTER — Other Ambulatory Visit: Payer: Self-pay | Admitting: Anesthesiology

## 2018-06-30 ENCOUNTER — Ambulatory Visit
Admission: RE | Admit: 2018-06-30 | Discharge: 2018-06-30 | Disposition: A | Discharge status: Home / Self Care | Payer: Medicare Other | Source: Ambulatory Visit | Attending: Anesthesiology | Admitting: Anesthesiology

## 2018-06-30 ENCOUNTER — Ambulatory Visit: Payer: Medicare Other | Admitting: Physician Assistant

## 2018-06-30 ENCOUNTER — Ambulatory Visit: Payer: Medicare Other | Admitting: Internal Medicine

## 2018-06-30 ENCOUNTER — Encounter: Payer: Self-pay | Admitting: Internal Medicine

## 2018-06-30 VITALS — BP 96/68 | HR 53 | Ht 66.0 in | Wt 232.0 lb

## 2018-06-30 DIAGNOSIS — K219 Gastro-esophageal reflux disease without esophagitis: Secondary | ICD-10-CM

## 2018-06-30 DIAGNOSIS — K222 Esophageal obstruction: Secondary | ICD-10-CM

## 2018-06-30 DIAGNOSIS — M533 Sacrococcygeal disorders, not elsewhere classified: Principal | ICD-10-CM

## 2018-06-30 DIAGNOSIS — G8929 Other chronic pain: Secondary | ICD-10-CM

## 2018-06-30 DIAGNOSIS — M47816 Spondylosis without myelopathy or radiculopathy, lumbar region: Secondary | ICD-10-CM | POA: Diagnosis not present

## 2018-06-30 MED ORDER — PROMETHAZINE HCL 25 MG PO TABS: 25.0000 mg | ORAL_TABLET | Freq: Four times a day (QID) | ORAL | 11 refills | Status: DC | PRN

## 2018-06-30 MED ORDER — ESOMEPRAZOLE MAGNESIUM 40 MG PO CPDR: 40.0000 mg | DELAYED_RELEASE_CAPSULE | Freq: Two times a day (BID) | ORAL | 11 refills | Status: DC

## 2018-06-30 MED ORDER — ONDANSETRON HCL 4 MG PO TABS: 4.0000 mg | ORAL_TABLET | ORAL | 11 refills | Status: DC | PRN

## 2018-06-30 NOTE — Progress Notes (Signed)
HISTORY OF PRESENT ILLNESS:  Shelby Anderson is a 62 y.o. female with multiple significant medical problems as listed below. She presents today regarding ongoing management of her reflux disease. Her chief complaints include intermittent problems with breakthrough symptoms and regurgitation, particularly at night. As well some mild recurrence of her dysphagia, particularly to pills. She is on multiple medications as listed. For her reflux she is on Nexium 40 mg twice daily. She continues to gain weight. She tells me that she has intermittent problems with nausea. She takes Phenergan at night or it she requests a refill of medication. During the day she had been using Zofran as needed. She requests a refill of this as well. She denies abdominal pain. No change in bowel habits from baseline.We last saw the patient in November 2018 when she underwent upper endoscopy with dilation of her esophageal stricture to 22 [place]. 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 Her last colonoscopy elsewhere was 2012 and negative for neoplasia.  REVIEW OF SYSTEMS:  All non-GI ROS negative except for sinus allergy, arthritis, back pain, depression, fatigue, night sweats, itching, heart murmur, headaches, sleeping problems, sore throat, ankle edema, hoarseness, shortness of breath  Past Medical History:  Diagnosis Date  . Allergy   . Anemia    "chronic"  . Angina   . Anxiety and depression   . Arthritis   . Asthma   . Atrial fibrillation ([place])    h/o "AF w/frequent PVCs"  . Cancer (HCC)    hx of skin cancer   . CHF (congestive heart failure) ([place])   . Chronic back pain greater than 3 months duration    on chronic narcotics, treated at pain clinic  . Colon polyps    hyperplastic  . Coronary artery disease    Arrythmia, orthostatic hypotension, HLD, HTN; sees Dr. Einar Gip  . Difficult intubation    "TMJ & woke up when they were still cutting on me"  . Dysrhythmia    sees Dr.  Einar Gip and a cardiologist at [facility]  . Esophageal stricture   . Fatty liver   . Fibroids   . Fibromyalgia    "in my legs"  . GERD (gastroesophageal reflux disease)    hx hiatal hernia, stricture and gastric ulcer  . Headache(784.0)   . Heart murmur   . Hiatal hernia   . History of loop recorder   . History of migraines    "dx'd when I was in my teens"  . Hyperlipemia   . Hypertension   . Mental disorder   . Mild episode of recurrent major depressive disorder ([place]) 12/06/2015  . Myocardial infarction [facility]) 1980's & 1990;   sees Dr. Einar Gip  . Pneumonia   . PONV (postoperative nausea and vomiting)   . Recurrent upper respiratory infection (URI)   . Shortness of breath 11/20/11   "all the time", sees pulmonlogy, ? asthma  . Stenotic cervical os   . Stomach ulcer    "3 small; found in 05/2011"  . TMJ (dislocation of temporomandibular joint)   . Tuberculosis    + TB SKIN TEST  . Type 2 diabetes mellitus without complication, without long-term current use of insulin ([place]) 12/06/2015   not on meds     Past Surgical History:  Procedure Laterality Date  . ACHILLES TENDON REPAIR  1970's   left ankle  . ARTHROSCOPIC REPAIR ACL     left knee cap  . BREAST BIOPSY Right 04/08/2013  .  CARDIAC CATHETERIZATION     loop recorder  . CARPAL TUNNEL RELEASE  unknown   left hand  . ESOPHAGOGASTRODUODENOSCOPY (EGD) WITH PROPOFOL N/A 10/29/2017   Procedure: ESOPHAGOGASTRODUODENOSCOPY (EGD) WITH PROPOFOL;  Surgeon: Irene Shipper, MD;  Location: [facility] ENDOSCOPY;  Service: Endoscopy;  Laterality: N/A;  . LOOP RECORDER IMPLANT    . post ganglionectomy  1970's   "for migraine headaches"  . pouch string  [phone]   "did this 3 times (once w/each pregnancy)"  . SAVORY DILATION N/A 10/29/2017   Procedure: SAVORY DILATION;  Surgeon: Irene Shipper, MD;  Location: [facility];  Service: Endoscopy;  Laterality: N/A;  . TOTAL KNEE ARTHROPLASTY Left 09/25/2016   Procedure: LEFT TOTAL KNEE ARTHROPLASTY;   Surgeon: Paralee Cancel, MD;  Location: [facility] ORS;  Service: Orthopedics;  Laterality: Left;  . TUBAL LIGATION  1980's    Social History Shelby Anderson  reports that she has never smoked. She has never used smokeless tobacco. She reports that she does not drink alcohol or use drugs.  family history includes Cancer in her brother, brother, brother, father, paternal grandmother, sister, and sister; Diabetes in her father and sister; Heart disease in her father, maternal grandfather, maternal grandmother, mother, paternal grandfather, paternal grandmother, and sister; Hypertension in her father, mother, sister, and sister; Malignant hyperthermia in her father.  Allergies  Allergen Reactions  . Lodine [Etodolac] Anaphylaxis, Hives and Swelling  . Oxycontin [Oxycodone Hcl] Anaphylaxis    hives, trouble breathing, tongue swelling (Only Oxycontin) Tolerates plain oxycodone.  Marland Kitchen Penicillins Anaphylaxis    Told by a surgeon never to take it again. Has patient had a PCN reaction causing immediate rash, facial/tongue/throat swelling, SOB or lightheadedness with hypotension: Yes Has patient had a PCN reaction causing severe rash involving mucus membranes or skin necrosis: Unknown Has patient had a PCN reaction that required hospitalization: No Has patient had a PCN reaction occurring within the last 10 years: No If all of the above answers are "NO", then may proceed with Cephalosporin use.  . Aspirin Other (See Comments)    High-dose caused GI Bleeds  . Darvocet [Propoxyphene N-Acetaminophen] Hives  . Nitroglycerin     IV-BP drops dramatically  . Ultram [Tramadol Hcl] Hives  . Valium Other (See Comments)    Circulation problems. "Legs turned black".       PHYSICAL EXAMINATION: Vital signs: BP 96/68   Pulse (!) 53   Ht 5\' 6"  (1.676 m)   Wt 232 lb (105.2 kg)   BMI 37.45 kg/m   Constitutional:pleasant, obese, chronically ill-appearing, no acute distress Psychiatric: alert and oriented x3,  cooperative Eyes: extraocular movements intact, anicteric, conjunctiva pink Mouth: oral pharynx moist, no lesions Neck: supple no lymphadenopathy Cardiovascular: heart regular rate and rhythm, no murmur Lungs: clear to auscultation bilaterally Abdomen: soft,obese, nontender, nondistended, no obvious ascites, no peritoneal signs, normal bowel sounds, no organomegaly Rectal:admitted Extremities: no clubbing or cyanosis. Trace lower extremity edema bilaterally Skin: no lesions on visible extremities Neuro: No focal deficits. Cranial nerves intact  ASSESSMENT:  #1. GERD. Symptoms as described. Particular nocturnal #2. Chronic nausea #3. Multiple medical problems #4. History of esophageal stricture status post dilation November 2018. Now with mild manageable dysphagia   PLAN:  #1. Reflux precautions. Meal size and avoidance of eating at least 4 hours prior to bedtime stressed #2. Weight loss. The importance of this stressed #3. Refill Nexium 40 mg twice daily #4. Refill Phenergan #5. Prescribe Zofran 4 mg every 4-6 hours as needed for severe  nausea #6. Routine office follow-up one year  25 minutes spent face-to-face with the patient. Greater than 50% a time use for counseling regarding her complicated reflux disease

## 2018-06-30 NOTE — Patient Instructions (Addendum)
We have sent the following medications to your pharmacy for you to pick up at your convenience:  Nexium 40 mg twice a day Zofran 4 mg every 4 hours as needed  Phenergan as needed

## 2018-07-08 ENCOUNTER — Other Ambulatory Visit: Payer: Self-pay | Admitting: Family Medicine

## 2018-07-08 DIAGNOSIS — Z1231 Encounter for screening mammogram for malignant neoplasm of breast: Secondary | ICD-10-CM

## 2018-07-14 ENCOUNTER — Encounter: Payer: Self-pay | Admitting: *Deleted

## 2018-07-15 ENCOUNTER — Ambulatory Visit: Payer: Medicare Other | Admitting: Primary Care

## 2018-07-15 ENCOUNTER — Encounter: Payer: Self-pay | Admitting: Primary Care

## 2018-07-15 ENCOUNTER — Ambulatory Visit (INDEPENDENT_AMBULATORY_CARE_PROVIDER_SITE_OTHER)
Admission: RE | Admit: 2018-07-15 | Discharge: 2018-07-15 | Disposition: A | Discharge status: Home / Self Care | Payer: Medicare Other | Source: Ambulatory Visit | Attending: Primary Care | Admitting: Primary Care

## 2018-07-15 ENCOUNTER — Other Ambulatory Visit: Payer: Self-pay | Admitting: Family Medicine

## 2018-07-15 ENCOUNTER — Other Ambulatory Visit (INDEPENDENT_AMBULATORY_CARE_PROVIDER_SITE_OTHER): Payer: Medicare Other

## 2018-07-15 VITALS — BP 110/78 | HR 69 | Ht 66.0 in | Wt 231.4 lb

## 2018-07-15 DIAGNOSIS — R0602 Shortness of breath: Secondary | ICD-10-CM

## 2018-07-15 DIAGNOSIS — G4733 Obstructive sleep apnea (adult) (pediatric): Secondary | ICD-10-CM

## 2018-07-15 DIAGNOSIS — J45991 Cough variant asthma: Secondary | ICD-10-CM | POA: Diagnosis not present

## 2018-07-15 DIAGNOSIS — K219 Gastro-esophageal reflux disease without esophagitis: Secondary | ICD-10-CM

## 2018-07-15 DIAGNOSIS — R0683 Snoring: Secondary | ICD-10-CM

## 2018-07-15 DIAGNOSIS — J302 Other seasonal allergic rhinitis: Secondary | ICD-10-CM | POA: Diagnosis not present

## 2018-07-15 LAB — BASIC METABOLIC PANEL
BUN: 10 mg/dL (ref 6–23)
CALCIUM: 10 mg/dL (ref 8.4–10.5)
CO2: 38 meq/L — AB (ref 19–32)
CREATININE: 0.72 mg/dL (ref 0.40–1.20)
Chloride: 101 mEq/L (ref 96–112)
GFR: 87.72 mL/min (ref >60.00)
GLUCOSE: 115 mg/dL — AB (ref 70–99)
Potassium: 4 mEq/L (ref 3.5–5.1)
Sodium: 140 mEq/L (ref 135–145)

## 2018-07-15 LAB — BRAIN NATRIURETIC PEPTIDE: Pro B Natriuretic peptide (BNP): 40 pg/mL (ref 0.0–100.0)

## 2018-07-15 LAB — POCT EXHALED NITRIC OXIDE: FeNO level (ppb): 11

## 2018-07-15 MED ORDER — BUDESONIDE-FORMOTEROL FUMARATE 80-4.5 MCG/ACT IN AERO: 2.0000 | INHALATION_SPRAY | Freq: Two times a day (BID) | RESPIRATORY_TRACT | 5 refills | Status: DC

## 2018-07-15 NOTE — Patient Instructions (Addendum)
Checking labs and CXR today  Continue antihistamine and start flonase daily for nasal congestion   Follow back up with GI for esophageal burning/pills getting stuck   Consider repeating sleep test, holding off at this time until test results back   FENO normal No change in spirometry - shows restriction, no obstruction

## 2018-07-15 NOTE — Progress Notes (Signed)
@Patient  ID: Shelby Anderson, female    DOB: February 19, 1956, 62 y.o.   MRN: [id number]  Chief Complaint  Patient presents with  . Follow-up    States cough is worse at night and wakes up in the night feeling like she is gasping for air.     Referring provider: Lucretia Kern, DO  HPI: 62 year old female, she has never smoked but her husband smokes. Follows with Dr. Elsworth Soho for persistent asthma. Last seen in office 10/2017. Maintained on Symbicort 80 and prn albuterol.   10/18/2017 Acute OV : Asthma  Pt presents for an acute office visit . She complains of 1-2 weeks of cough, congestion , drainage and intermittent wheezing . Breathing got worse with change in weather. Several family members have been sick with URI and Strep throat. Denies fever, chest pain, orthopnea. Edema or hemotpysis.    07/16/2018 Presents today for routine follow up visit. States that she typically is seen every three months but hadn't received call to make apt. Complains of np cough at night and occasionally wakes up gasping for breath. Continues using nocturnal oxygen. Associated nasal congestion, voice hoarseness and sob with ambulation. She started taking an antihistamine this week. Continues Singulair, hasn't been using Flonase but states that it worked very well in the past. States that she has bad acid reflux. Takes nexium 40mg  BID and carafate. She appears to be compliant with GERD diet. Saw GI a few weeks ago, no reported changes. Last dilation nov 2018. Has some esophageal burning and reports pills getting stuck in esophagus. Dsypnea is more than usual for the last few weeks. She is unable to exercise because of this. Usually requires neb treatment at night, used 3-4 times this past month with some reported benefit. Last sleep test completed in 2014, she does snore. Her weight was up to 250 but she has lost 10-15lbs. Weight loss has helped DOE. Patient states she has a new diagnosis of left heart failure which is  being treating symptomatically. Reports bilateral leg swelling 4 days ago after walking. Swelling went down after elevating her legs. Cardiologist is Dr. Einar Gip.     Significant tests/ events >> Spirometry 12/2011 - no airway obstruction -No reversibility with bronchodilator , mod restriction >> Home sleep test 07/2012 - showed desatn, no OSA >> 12/2013 -admitted asthma exacerbation,CAP, RSV bronchitis.  >> EGD 03/2011 ([facility]) - peptic ulcer disease.  >> 12/2013 barium esophagram - distal esophageal stricture. stricture dilated  >> 11/2015 ONO positive, Started on oxygen 2l/m . >> 10/2016 CT angio neg PE >> CT sinus 01/2017 >> clear >> 01/2017  Swallow >> Persistent/recurrent stricture at the GE junction  >> 07/15/18 FENO 11  >> Spirometry 07/15/18 FVC 1.64 (46%), FEV1 1.34 (49%), ratio 82 (104%) - unchanged from Dec 2017    Filed Weights   07/15/18 1400  Weight: 231 lb 6.4 oz (105 kg)       Allergies  Allergen Reactions  . Lodine [Etodolac] Anaphylaxis, Hives and Swelling  . Oxycontin [Oxycodone Hcl] Anaphylaxis    hives, trouble breathing, tongue swelling (Only Oxycontin) Tolerates plain oxycodone.  Marland Kitchen Penicillins Anaphylaxis    Told by a surgeon never to take it again. Has patient had a PCN reaction causing immediate rash, facial/tongue/throat swelling, SOB or lightheadedness with hypotension: Yes Has patient had a PCN reaction causing severe rash involving mucus membranes or skin necrosis: Unknown Has patient had a PCN reaction that required hospitalization: No Has patient had a PCN reaction occurring  within the last 10 years: No If all of the above answers are "NO", then may proceed with Cephalosporin use.  . Aspirin Other (See Comments)    High-dose caused GI Bleeds  . Darvocet [Propoxyphene N-Acetaminophen] Hives  . Nitroglycerin     IV-BP drops dramatically  . Ultram [Tramadol Hcl] Hives  . Valium Other (See Comments)    Circulation problems. "Legs turned black".     Immunization History  Administered Date(s) Administered  . Influenza Split 08/19/2011  . Influenza Whole 01/03/2013  . Influenza,inj,Quad PF,6+ Mos 08/20/2013, 09/15/2014, 08/26/2015, 08/01/2016, 08/22/2017  . Influenza-Unspecified 08/03/2016  . Pneumococcal Polysaccharide-23 08/19/2011, 12/12/2017  . Tdap 06/28/2014    Past Medical History:  Diagnosis Date  . Allergy   . Anemia    "chronic"  . Angina   . Anxiety and depression   . Arthritis   . Asthma   . Atrial fibrillation ([place])    h/o "AF w/frequent PVCs"  . Cancer (HCC)    hx of skin cancer   . CHF (congestive heart failure) ([place])   . Chronic back pain greater than 3 months duration    on chronic narcotics, treated at pain clinic  . Colon polyps    hyperplastic  . Coronary artery disease    Arrythmia, orthostatic hypotension, HLD, HTN; sees Dr. Einar Gip  . Difficult intubation    "TMJ & woke up when they were still cutting on me"  . Dysrhythmia    sees Dr. Einar Gip and a cardiologist at [facility]  . Esophageal stricture   . Fatty liver   . Fibroids   . Fibromyalgia    "in my legs"  . GERD (gastroesophageal reflux disease)    hx hiatal hernia, stricture and gastric ulcer  . Headache(784.0)   . Heart murmur   . Hiatal hernia   . History of loop recorder   . History of migraines    "dx'd when I was in my teens"  . Hyperlipemia   . Hypertension   . Mental disorder   . Mild episode of recurrent major depressive disorder ([place]) 12/06/2015  . Myocardial infarction [facility]) 1980's & 1990;   sees Dr. Einar Gip  . Pneumonia   . PONV (postoperative nausea and vomiting)   . Recurrent upper respiratory infection (URI)   . Shortness of breath 11/20/11   "all the time", sees pulmonlogy, ? asthma  . Stenotic cervical os   . Stomach ulcer    "3 small; found in 05/2011"  . TMJ (dislocation of temporomandibular joint)   . Tuberculosis    + TB SKIN TEST  . Type 2 diabetes mellitus without complication, without long-term  current use of insulin ([place]) 12/06/2015   not on meds     Tobacco History: Social History   Tobacco Use  Smoking Status Never Smoker  Smokeless Tobacco Never Used   Counseling given: Not Answered   Outpatient Medications Prior to Visit  Medication Sig Dispense Refill  . albuterol (PROAIR HFA) 108 (90 Base) MCG/ACT inhaler Inhale 2 puffs into the lungs every 4 hours as needed for shortness of breath 8 g 3  . albuterol (PROVENTIL) (2.5 MG/3ML) 0.083% nebulizer solution Take 3 mLs (2.5 mg total) by nebulization every 6 (six) hours as needed for wheezing or shortness of breath (J45.40). 75 mL 3  . aspirin EC 81 MG tablet Take 81 mg at bedtime by mouth.     Marland Kitchen atorvastatin (LIPITOR) 40 MG tablet TAKE 1 TABLET BY MOUTH EVERY DAY 90 tablet 1  .  Calcium Carb-Cholecalciferol (CALCIUM 600+D) 600-800 MG-UNIT TABS Take 1 tablet daily at 12 noon by mouth.     Marland Kitchen CARAFATE 1 GM/10ML suspension TAKE 10 MILLILITERS BY MOUTH 4 TIMES DAILY WITH MEALS AND AT BEDTIME 420 mL 2  . CINNAMON PO Take 1,000 mg 2 (two) times daily by mouth.    . Cyanocobalamin (VITAMIN B-12) 5000 MCG TBDP Take 5,000 mcg by mouth daily.     . diphenhydrAMINE (BENADRYL) 25 MG tablet Take 25 mg daily as needed by mouth (allergic reactions).    . docusate sodium (COLACE) 100 MG capsule Take 1 capsule (100 mg total) by mouth 2 (two) times daily. (Patient taking differently: Take 100-200 mg by mouth daily as needed for mild constipation. ) 10 capsule 0  . esomeprazole (NEXIUM) 40 MG capsule Take 1 capsule (40 mg total) by mouth 2 (two) times daily. 60 capsule 11  . Eszopiclone (ESZOPICLONE) 3 MG TABS Take 3 mg by mouth at bedtime as needed (insomnia).     . fluticasone (FLONASE) 50 MCG/ACT nasal spray Place 2 sprays at bedtime as needed into both nostrils for allergies or rhinitis.     Marland Kitchen glipiZIDE (GLUCOTROL) 5 MG tablet TAKE 1/2 TABLETS (2.5 MG TOTAL) BY MOUTH 2 (TWO) TIMES DAILY BEFORE A MEAL. 30 tablet 5  . hydrocortisone 2.5 % cream  APPLY TO AFFECTED AREA TWICE A DAY. FOR DERMATITIS ON EARS FOR 2 WEEKS  3  . isosorbide mononitrate (IMDUR) 60 MG 24 hr tablet TAKE 1 TABLET BY MOUTH EVERY DAY 90 tablet 1  . KLOR-CON M20 20 MEQ tablet TAKE 1 TABLET BY MOUTH EVERY DAY 30 tablet 4  . L-Methylfolate (DEPLIN) 7.5 MG TABS Take 7.5 mg by mouth daily with breakfast.     . LYRICA 150 MG capsule TAKE 1 CAPSULE BY MOUTH EVERY 8 HOURS AS DIRECTED  2  . metFORMIN (GLUCOPHAGE) 500 MG tablet Take 1 tablet (500 mg total) by mouth daily with breakfast. 90 tablet 1  . methocarbamol (ROBAXIN) 500 MG tablet Take 1 tablet (500 mg total) by mouth every 6 (six) hours as needed for muscle spasms. (Patient taking differently: Take 500 mg 3 (three) times daily as needed by mouth for muscle spasms. ) 60 tablet 0  . metoprolol tartrate (LOPRESSOR) 50 MG tablet TAKE 1 TABLET TWICE A DAY 180 tablet 1  . Misc Natural Products (GLUCOS-CHONDROIT-MSM COMPLEX PO) Take 1 tablet 3 (three) times daily by mouth.     . montelukast (SINGULAIR) 10 MG tablet TAKE 1 TABLET (10 MG TOTAL) BY MOUTH AT BEDTIME. 90 tablet 0  . Multiple Vitamin (MULITIVITAMIN WITH MINERALS) TABS Take 1 tablet by mouth daily.      . nitroGLYCERIN (NITROSTAT) 0.3 MG SL tablet Place 0.3 mg under the tongue every 5 (five) minutes as needed for chest pain (as needed).    . NONFORMULARY OR COMPOUNDED ITEM Estradiol .02% 1 ML Prefilled Applicator Sig: apply vaginally twice a week #90 Day Supply with 4 refills 1 each 4  . NONFORMULARY OR COMPOUNDED ITEM [facility]:  Onychomycosis nail lacquer - fluconazole 2%, terbinafine 1%, DMSO apply to affected area daily. 120 each 2  . ondansetron (ZOFRAN) 4 MG tablet Take 1 tablet (4 mg total) by mouth every 4 (four) hours as needed for nausea or vomiting. 30 tablet 11  . Oxycodone HCl 20 MG TABS Take 20 mg every 4 (four) hours as needed by mouth (pain).     . OXYGEN Inhale 2 L into the lungs at bedtime.    Marland Kitchen  progesterone (PROMETRIUM) 200 MG capsule TAKE  1 CAPSULE BY MOUTH EVERY DAY FOR FIRST 12 DAYS OF MONTH 36 capsule 2  . promethazine (PHENERGAN) 25 MG tablet Take 1 tablet (25 mg total) by mouth every 6 (six) hours as needed for nausea or vomiting. 30 tablet 11  . triamcinolone cream (KENALOG) 0.1 % APPLY 1 APPLICATION TOPICALLY 2 (TWO) TIMES DAILY. 80 g 1  . budesonide-formoterol (SYMBICORT) 80-4.5 MCG/ACT inhaler INHALE 2 PUFFS INTO THE LUNGS FIRST THING IN THE AM AND THEN ANOTHER 2 PUFFS 12 HOURS LATER 10.2 Inhaler 5   No facility-administered medications prior to visit.     Review of Systems  Review of Systems  Constitutional: Negative.   HENT: Positive for congestion and postnasal drip.   Respiratory: Positive for cough, shortness of breath and wheezing.        Cough and wheeze at night. SOB with walking.   Cardiovascular: Negative.     Physical Exam  BP 110/78   Pulse 69   Ht 5\' 6"  (1.676 m)   Wt 231 lb 6.4 oz (105 kg)   SpO2 95%   BMI 37.35 kg/m  Physical Exam  Constitutional: She is oriented to person, place, and time. She appears well-developed and well-nourished. No distress.  Chronically ill appearing   HENT:  Head: Normocephalic and atraumatic.  Eyes: Pupils are equal, round, and reactive to light. EOM are normal.  Neck: Normal range of motion. Neck supple.  Cardiovascular: Normal rate and regular rhythm.  Pulmonary/Chest: Effort normal and breath sounds normal. No respiratory distress. She has no wheezes. She has no rales.  Musculoskeletal: Normal range of motion.  Neurological: She is alert and oriented to person, place, and time.  Skin: Skin is warm and dry.  Psychiatric: Her behavior is normal. Judgment and thought content normal.     Lab Results:  CBC    Component Value Date/Time   WBC 6.0 04/21/2018 1151   RBC 4.50 04/21/2018 1151   HGB 12.9 04/21/2018 1151   HCT 39.7 04/21/2018 1151   PLT 253.0 04/21/2018 1151   MCV 88.4 04/21/2018 1151   MCH 28.2 10/05/2016 1555   MCHC 32.5 04/21/2018 1151    RDW 14.8 04/21/2018 1151   LYMPHSABS 1.7 03/30/2015 1641   MONOABS 0.3 03/30/2015 1641   EOSABS 0.3 03/30/2015 1641   BASOSABS 0.0 03/30/2015 1641    BMET    Component Value Date/Time   NA 140 07/15/2018 1455   K 4.0 07/15/2018 1455   CL 101 07/15/2018 1455   CO2 38 (H) 07/15/2018 1455   GLUCOSE 115 (H) 07/15/2018 1455   BUN 10 07/15/2018 1455   CREATININE 0.72 07/15/2018 1455   CALCIUM 10.0 07/15/2018 1455   GFRNONAA >60 10/05/2016 1555   GFRAA >60 10/05/2016 1555    BNP No results found for: BNP  ProBNP    Component Value Date/Time   PROBNP 40.0 07/15/2018 1455    Imaging: Dg Chest 2 View  Result Date: 07/15/2018 CLINICAL DATA:  Chronic shortness of breath. History of hypertension and diabetes. EXAM: CHEST - 2 VIEW COMPARISON:  11/13/2016 FINDINGS: Cardiac silhouette is mildly enlarged. No mediastinal or hilar masses. No evidence of adenopathy. Lungs are clear.  No pleural effusion or pneumothorax. Skeletal structures are intact. IMPRESSION: 1. No acute cardiopulmonary disease. 2. Mild cardiomegaly. Electronically Signed   By: Lajean Manes M.D.   On: 07/15/2018 15:15   Dg Lumbar Spine Complete  Result Date: 06/30/2018 CLINICAL DATA:  62 year old female with acute onset  of right-sided lower back pain for the past 3 weeks. No trauma. Initial encounter. EXAM: LUMBAR SPINE - COMPLETE 4+ VIEW COMPARISON:  04/26/2015 lumbar spine MR. 12/25/2010 postmyelogram CT. FINDINGS: Minimal curvature lumbar spine convex right. Minimal Schmorl's node deformity T12-L1, L2-3, L3-4 and L5-S1. No significant disc space narrowing. No pars defect. No compression fracture. Minimal right facet degenerative changes. Sacroiliac joints intact. IMPRESSION: No significant lumbar disc space narrowing. Minimal right facet degenerative changes. Electronically Signed   By: Genia Del M.D.   On: 06/30/2018 10:32     Assessment & Plan:   GERD (gastroesophageal reflux disease) Hx gastric ulcer and  esophageal stricture. Last dilation in Nov 2018 Reports bad acid reflux symptoms. 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 BNP normal  - Spirometry showed restriction, no obstruction (unchanged from dec 2017). FVC 1.64 (46%), FEV1 1.34 (49%), ratio 82 (104%)  - GERD likely playing a role in her symptoms at night   Allergic rhinitis - Continue antihistamine and add flonase   Snoring - Reports snoring, wakes up gasping for breath  - Previous negative HST in 2013, showed O2 desaturation- wears nocuturnal oxygen - Recheck HST      Martyn Ehrich, NP 07/16/2018

## 2018-07-16 ENCOUNTER — Ambulatory Visit: Payer: Medicare Other | Admitting: Physician Assistant

## 2018-07-16 DIAGNOSIS — R0683 Snoring: Secondary | ICD-10-CM | POA: Insufficient documentation

## 2018-07-16 NOTE — Assessment & Plan Note (Addendum)
Hx gastric ulcer and esophageal stricture. Last dilation in Nov 2018 Reports bad acid reflux symptoms. Follows GERD diet  Continues Nexium 40mg  BID and carafate Follows with GI

## 2018-07-16 NOTE — Assessment & Plan Note (Signed)
-   Reports dry cough and wheeze at night, uses nebulizer with reported benefit  - FENO today 11. BNP normal  - Spirometry showed restriction, no obstruction (unchanged from dec 2017). FVC 1.64 (46%), FEV1 1.34 (49%), ratio 82 (104%)  - GERD likely playing a role in her symptoms at night

## 2018-07-16 NOTE — Assessment & Plan Note (Signed)
-   Reports snoring, wakes up gasping for breath  - Previous negative HST in 2013, showed O2 desaturation- wears nocuturnal oxygen - Recheck HST

## 2018-07-16 NOTE — Assessment & Plan Note (Signed)
-   Continue antihistamine and add flonase

## 2018-07-19 NOTE — Progress Notes (Signed)
Reviewed & agree with plan  

## 2018-07-22 DIAGNOSIS — D3132 Benign neoplasm of left choroid: Secondary | ICD-10-CM | POA: Diagnosis not present

## 2018-07-22 DIAGNOSIS — E119 Type 2 diabetes mellitus without complications: Secondary | ICD-10-CM | POA: Diagnosis not present

## 2018-07-22 DIAGNOSIS — H04123 Dry eye syndrome of bilateral lacrimal glands: Secondary | ICD-10-CM | POA: Diagnosis not present

## 2018-07-22 DIAGNOSIS — H2513 Age-related nuclear cataract, bilateral: Secondary | ICD-10-CM | POA: Diagnosis not present

## 2018-07-22 LAB — HM DIABETES EYE EXAM

## 2018-07-23 DIAGNOSIS — Z79891 Long term (current) use of opiate analgesic: Secondary | ICD-10-CM | POA: Diagnosis not present

## 2018-07-23 DIAGNOSIS — S9422XS Injury of deep peroneal nerve at ankle and foot level, left leg, sequela: Secondary | ICD-10-CM | POA: Diagnosis not present

## 2018-07-23 DIAGNOSIS — M47816 Spondylosis without myelopathy or radiculopathy, lumbar region: Secondary | ICD-10-CM | POA: Diagnosis not present

## 2018-07-23 DIAGNOSIS — J45909 Unspecified asthma, uncomplicated: Secondary | ICD-10-CM | POA: Diagnosis not present

## 2018-07-23 DIAGNOSIS — G894 Chronic pain syndrome: Secondary | ICD-10-CM | POA: Diagnosis not present

## 2018-07-24 ENCOUNTER — Ambulatory Visit (INDEPENDENT_AMBULATORY_CARE_PROVIDER_SITE_OTHER): Payer: Medicare Other | Admitting: Family Medicine

## 2018-07-24 ENCOUNTER — Encounter: Payer: Self-pay | Admitting: Family Medicine

## 2018-07-24 VITALS — BP 120/80 | HR 65 | Temp 98.3°F | Ht 66.0 in | Wt 231.8 lb

## 2018-07-24 DIAGNOSIS — E785 Hyperlipidemia, unspecified: Secondary | ICD-10-CM

## 2018-07-24 DIAGNOSIS — J9611 Chronic respiratory failure with hypoxia: Secondary | ICD-10-CM | POA: Diagnosis not present

## 2018-07-24 DIAGNOSIS — E1159 Type 2 diabetes mellitus with other circulatory complications: Secondary | ICD-10-CM | POA: Diagnosis not present

## 2018-07-24 DIAGNOSIS — E1169 Type 2 diabetes mellitus with other specified complication: Secondary | ICD-10-CM

## 2018-07-24 DIAGNOSIS — E119 Type 2 diabetes mellitus without complications: Secondary | ICD-10-CM | POA: Diagnosis not present

## 2018-07-24 DIAGNOSIS — K222 Esophageal obstruction: Secondary | ICD-10-CM

## 2018-07-24 DIAGNOSIS — I10 Essential (primary) hypertension: Secondary | ICD-10-CM

## 2018-07-24 DIAGNOSIS — I152 Hypertension secondary to endocrine disorders: Secondary | ICD-10-CM

## 2018-07-24 MED ORDER — GLIPIZIDE ER 5 MG PO TB24: 5.0000 mg | ORAL_TABLET | Freq: Every day | ORAL | 3 refills | Status: DC

## 2018-07-24 MED ORDER — POTASSIUM CHLORIDE ER 10 MEQ PO TBCR: 20.0000 meq | EXTENDED_RELEASE_TABLET | Freq: Every day | ORAL | 1 refills | Status: DC

## 2018-07-24 NOTE — Progress Notes (Signed)
HPI:  Using dictation device. Unfortunately this device frequently misinterprets words/phrases.  Shelby Anderson is a pleasant 62 y.o. here for follow up. Chronic medical problems summarized below were reviewed for changes. Reports is doing ok. Just saw pulmonologist recently and will be seeing her cardiologist soon for her chronic dyspnea. Reports she has some new L sided heart failure. Has some swelling in ankles at times, but otherwise stable symptoms. No CP.  She reports she cut out sodas and is very please with this. She is trying to eat a little healthier. Is active taking care of grandkids but no regular exercise. Is tolerating the metformin well and report no GI symptoms with it. She wonders if she could have an easier dosing for the glipizide and also reports the potatssium pills are too big to swallow. Reports pharmacist told her they have smaller 10 mE tablets. Reports mood is ok. Denies CP, SOB, DOE, treatment intolerance or new symptoms.  AWV was 11/2017 with Manuela Schwartz  Diabetes Mellitus: -takes cinnamon, glipizide, metformin added 04/2018 -no regular exercise, but she is active taking care of grandchildren -hx CAD (sees cardiologist), HLD, HTN, orthostattic hypotension - on statin and asa, not on acei - has asthma  GERD/constipation: -hx bad reflux, esophageal stricture s/p dilation and gastric ulcer -sees Dr. Henrene Pastor for this  -on nexium 40mg  bid, carafate, prn phenergan, colace  CAD hx MI x2 per her report/Arrythmia/HTN/HLD/orthostatic hypotension/presyncope: -seeing several cardiologists -meds: asa, statin, metoprolol, fish oil, isosorbid, K+, ntg prn -denies: CP, SOB, palpitations -on florinef in the past  Asthma/Allergies: -sees pulmonologist for this -meds: singulair, symbicort, albuterol, flonase  Insomnia: -uses benydrl occ for this -eszopiclone on med list from ? [facility] -reports she very rarely takes this, may be a few times per year. -hx of low O2 at  night  Chronic back pain/Knee pain: -hx DDD and neuropathy, OA knees -seeing [facility] specialist and referred to ortho -on lyrica, deplin, robaxin -on methadone in the past and opana - reports reacted to them -aware of risks with medications, adamant about continuing mobic as feels is only medication that helps  MDD: -reports on deplin from pain management for this, does not feel helps -declined other treatments offered here -Chronic symptoms: mildly constant depressed mood and hopeless feelings at times, no SI, panic or manic symptoms  HRT/postmenopausl bleeding: -on prometrium with her gynecologist, Dr. Toney Rakes   ROS: See pertinent positives and negatives per HPI.  Past Medical History:  Diagnosis Date  . Allergy   . Anemia    "chronic"  . Angina   . Anxiety and depression   . Arthritis   . Asthma   . Atrial fibrillation (Home)    h/o "AF w/frequent PVCs"  . Cancer (HCC)    hx of skin cancer   . CHF (congestive heart failure) ([place])   . Chronic back pain greater than 3 months duration    on chronic narcotics, treated at pain clinic  . Colon polyps    hyperplastic  . Coronary artery disease    Arrythmia, orthostatic hypotension, HLD, HTN; sees Dr. Einar Gip  . Difficult intubation    "TMJ & woke up when they were still cutting on me"  . Dysrhythmia    sees Dr. Einar Gip and a cardiologist at [facility]  . Esophageal stricture   . Fatty liver   . Fibroids   . Fibromyalgia    "in my legs"  . GERD (gastroesophageal reflux disease)    hx hiatal hernia, stricture and  gastric ulcer  . Headache(784.0)   . Heart murmur   . Hiatal hernia   . History of loop recorder   . History of migraines    "dx'd when I was in my teens"  . Hyperlipemia   . Hypertension   . Mental disorder   . Mild episode of recurrent major depressive disorder ([place]) 12/06/2015  . Myocardial infarction [facility]) 1980's & 1990;   sees Dr. Einar Gip  . Pneumonia   . PONV (postoperative  nausea and vomiting)   . Recurrent upper respiratory infection (URI)   . Shortness of breath 11/20/11   "all the time", sees pulmonlogy, ? asthma  . Stenotic cervical os   . Stomach ulcer    "3 small; found in 05/2011"  . TMJ (dislocation of temporomandibular joint)   . Tuberculosis    + TB SKIN TEST  . Type 2 diabetes mellitus without complication, without long-term current use of insulin ([place]) 12/06/2015   not on meds     Past Surgical History:  Procedure Laterality Date  . ACHILLES TENDON REPAIR  1970's   left ankle  . ARTHROSCOPIC REPAIR ACL     left knee cap  . BREAST BIOPSY Right 04/08/2013  . CARDIAC CATHETERIZATION     loop recorder  . CARPAL TUNNEL RELEASE  unknown   left hand  . ESOPHAGOGASTRODUODENOSCOPY (EGD) WITH PROPOFOL N/A 10/29/2017   Procedure: ESOPHAGOGASTRODUODENOSCOPY (EGD) WITH PROPOFOL;  Surgeon: Irene Shipper, MD;  Location: [facility] ENDOSCOPY;  Service: Endoscopy;  Laterality: N/A;  . LOOP RECORDER IMPLANT    . post ganglionectomy  1970's   "for migraine headaches"  . pouch string  [phone]   "did this 3 times (once w/each pregnancy)"  . SAVORY DILATION N/A 10/29/2017   Procedure: SAVORY DILATION;  Surgeon: Irene Shipper, MD;  Location: [facility];  Service: Endoscopy;  Laterality: N/A;  . TOTAL KNEE ARTHROPLASTY Left 09/25/2016   Procedure: LEFT TOTAL KNEE ARTHROPLASTY;  Surgeon: Paralee Cancel, MD;  Location: [facility] ORS;  Service: Orthopedics;  Laterality: Left;  . TUBAL LIGATION  1980's    Family History  Problem Relation Age of Onset  . Malignant hyperthermia Father   . Hypertension Father   . Heart disease Father   . Diabetes Father   . Cancer Father        skin  . Hypertension Mother   . Heart disease Mother   . Cancer Sister        CERVICAL  . Hypertension Sister   . Cancer Brother        MELANOMA  . Heart disease Maternal Grandmother   . Heart disease Maternal Grandfather   . Cancer Paternal Grandmother        ?   Marland Kitchen Heart disease Paternal  Grandmother   . Heart disease Paternal Grandfather   . Cancer Brother        LUNG  . Diabetes Sister   . Hypertension Sister   . Heart disease Sister   . Cancer Sister   . Cancer Brother   . Anesthesia problems Neg Hx   . Hypotension Neg Hx   . Pseudochol deficiency Neg Hx   . Colon cancer Neg Hx   . Esophageal cancer Neg Hx   . Pancreatic cancer Neg Hx   . Rectal cancer Neg Hx   . Stomach cancer Neg Hx     SOCIAL HX: see hpi   Current Outpatient Medications:  .  albuterol (PROAIR HFA) 108 (90 Base) MCG/ACT inhaler, Inhale  2 puffs into the lungs every 4 hours as needed for shortness of breath, Disp: 8 g, Rfl: 3 .  albuterol (PROVENTIL) (2.5 MG/3ML) 0.083% nebulizer solution, Take 3 mLs (2.5 mg total) by nebulization every 6 (six) hours as needed for wheezing or shortness of breath (J45.40)., Disp: 75 mL, Rfl: 3 .  aspirin EC 81 MG tablet, Take 81 mg at bedtime by mouth. , Disp: , Rfl:  .  atorvastatin (LIPITOR) 40 MG tablet, TAKE 1 TABLET BY MOUTH EVERY DAY, Disp: 90 tablet, Rfl: 1 .  budesonide-formoterol (SYMBICORT) 80-4.5 MCG/ACT inhaler, Inhale 2 puffs into the lungs every 12 (twelve) hours. INHALE 2 PUFFS INTO THE LUNGS FIRST THING IN THE AM AND THEN ANOTHER 2 PUFFS 12 HOURS LATER, Disp: 10.2 Inhaler, Rfl: 5 .  Calcium Carb-Cholecalciferol (CALCIUM 600+D) 600-800 MG-UNIT TABS, Take 1 tablet daily at 12 noon by mouth. , Disp: , Rfl:  .  CARAFATE 1 GM/10ML suspension, TAKE 10 MILLILITERS BY MOUTH 4 TIMES DAILY WITH MEALS AND AT BEDTIME, Disp: 420 mL, Rfl: 2 .  CINNAMON PO, Take 1,000 mg 2 (two) times daily by mouth., Disp: , Rfl:  .  Cyanocobalamin (VITAMIN B-12) 5000 MCG TBDP, Take 5,000 mcg by mouth daily. , Disp: , Rfl:  .  diphenhydrAMINE (BENADRYL) 25 MG tablet, Take 25 mg daily as needed by mouth (allergic reactions)., Disp: , Rfl:  .  docusate sodium (COLACE) 100 MG capsule, Take 1 capsule (100 mg total) by mouth 2 (two) times daily. (Patient taking differently: Take  100-200 mg by mouth daily as needed for mild constipation. ), Disp: 10 capsule, Rfl: 0 .  esomeprazole (NEXIUM) 40 MG capsule, Take 1 capsule (40 mg total) by mouth 2 (two) times daily., Disp: 60 capsule, Rfl: 11 .  Eszopiclone (ESZOPICLONE) 3 MG TABS, Take 3 mg by mouth at bedtime as needed (insomnia). , Disp: , Rfl:  .  fluticasone (FLONASE) 50 MCG/ACT nasal spray, Place 2 sprays at bedtime as needed into both nostrils for allergies or rhinitis. , Disp: , Rfl:  .  hydrocortisone 2.5 % cream, APPLY TO AFFECTED AREA TWICE A DAY. FOR DERMATITIS ON EARS FOR 2 WEEKS, Disp: , Rfl: 3 .  isosorbide mononitrate (IMDUR) 60 MG 24 hr tablet, TAKE 1 TABLET BY MOUTH EVERY DAY, Disp: 90 tablet, Rfl: 1 .  L-Methylfolate (DEPLIN) 7.5 MG TABS, Take 7.5 mg by mouth daily with breakfast. , Disp: , Rfl:  .  LYRICA 150 MG capsule, TAKE 1 CAPSULE BY MOUTH EVERY 8 HOURS AS DIRECTED, Disp: , Rfl: 2 .  metFORMIN (GLUCOPHAGE) 500 MG tablet, Take 1 tablet (500 mg total) by mouth daily with breakfast., Disp: 90 tablet, Rfl: 1 .  methocarbamol (ROBAXIN) 500 MG tablet, Take 1 tablet (500 mg total) by mouth every 6 (six) hours as needed for muscle spasms. (Patient taking differently: Take 500 mg 3 (three) times daily as needed by mouth for muscle spasms. ), Disp: 60 tablet, Rfl: 0 .  metoprolol tartrate (LOPRESSOR) 50 MG tablet, TAKE 1 TABLET TWICE A DAY, Disp: 180 tablet, Rfl: 1 .  Misc Natural Products (GLUCOS-CHONDROIT-MSM COMPLEX PO), Take 1 tablet 3 (three) times daily by mouth. , Disp: , Rfl:  .  montelukast (SINGULAIR) 10 MG tablet, TAKE 1 TABLET (10 MG TOTAL) BY MOUTH AT BEDTIME., Disp: 90 tablet, Rfl: 0 .  Multiple Vitamin (MULITIVITAMIN WITH MINERALS) TABS, Take 1 tablet by mouth daily.  , Disp: , Rfl:  .  nitroGLYCERIN (NITROSTAT) 0.3 MG SL tablet, Place 0.3  mg under the tongue every 5 (five) minutes as needed for chest pain (as needed)., Disp: , Rfl:  .  NONFORMULARY OR COMPOUNDED ITEM, Estradiol .02% 1 ML Prefilled  Applicator Sig: apply vaginally twice a week #90 Day Supply with 4 refills, Disp: 1 each, Rfl: 4 .  NONFORMULARY OR COMPOUNDED ITEM, [facility]:  Onychomycosis nail lacquer - fluconazole 2%, terbinafine 1%, DMSO apply to affected area daily., Disp: 120 each, Rfl: 2 .  ondansetron (ZOFRAN) 4 MG tablet, Take 1 tablet (4 mg total) by mouth every 4 (four) hours as needed for nausea or vomiting., Disp: 30 tablet, Rfl: 11 .  Oxycodone HCl 20 MG TABS, Take 20 mg every 4 (four) hours as needed by mouth (pain). , Disp: , Rfl:  .  OXYGEN, Inhale 2 L into the lungs at bedtime., Disp: , Rfl:  .  progesterone (PROMETRIUM) 200 MG capsule, TAKE 1 CAPSULE BY MOUTH EVERY DAY FOR FIRST 12 DAYS OF MONTH, Disp: 36 capsule, Rfl: 2 .  promethazine (PHENERGAN) 25 MG tablet, Take 1 tablet (25 mg total) by mouth every 6 (six) hours as needed for nausea or vomiting., Disp: 30 tablet, Rfl: 11 .  triamcinolone cream (KENALOG) 0.1 %, APPLY 1 APPLICATION TOPICALLY 2 (TWO) TIMES DAILY., Disp: 80 g, Rfl: 1 .  glipiZIDE (GLUCOTROL XL) 5 MG 24 hr tablet, Take 1 tablet (5 mg total) by mouth daily with breakfast., Disp: 90 tablet, Rfl: 3 .  potassium chloride (KLOR-CON 10) 10 MEQ tablet, Take 2 tablets (20 mEq total) by mouth daily., Disp: 180 tablet, Rfl: 1  EXAM:  Vitals:   07/24/18 1554  BP: 120/80  Pulse: 65  Temp: 98.3 F (36.8 C)    Body mass index is 37.41 kg/m.  GENERAL: vitals reviewed and listed above, alert, oriented, appears well hydrated and in no acute distress  HEENT: atraumatic, conjunttiva clear, no obvious abnormalities on inspection of external nose and ears  NECK: no obvious masses on inspection  LUNGS: clear to auscultation bilaterally, no wheezes, rales or rhonchi, good air movement  CV: HRRR, no peripheral edema  MS: moves all extremities without noticeable abnormality  PSYCH: pleasant and cooperative, no obvious depression or anxiety  ASSESSMENT AND PLAN:  Discussed the following  assessment and plan:  Type 2 diabetes mellitus without complication, without long-term current use of insulin ([place]) - Plan: Basic metabolic panel, Hemoglobin A1c, Microalbumin / creatinine urine ratio  Hypertension associated with diabetes ([place])  Hyperlipidemia associated with type 2 diabetes mellitus ([place])  Esophageal stricture  Chronic respiratory failure with hypoxia (HCC)  -change up glipizide to once daily dosing -change klorcon per her request -labs per orders -lifestyle recs - congratulated on changes -cont care with cardiology/pulmonology -reports she is scheduled for gyn exam -reports will do optho exam -reports will get flu shot in october -Patient advised to return or notify a doctor immediately if symptoms worsen or persist or new concerns arise.  Patient Instructions  BEFORE YOU LEAVE: -labs -follow up: AWV with Manuela Schwartz and CPE with Dr. [name] in December  I changed the glipizide to the extended release, 5mg  once daily. STOP the other glipizide and use this instead.  I sent in the new dose of Potassium (Klor-con) that you requested (10mg  tablets to take 2 tablets daily)    We recommend the following healthy lifestyle for LIFE: 1) Small portions. But, make sure to get regular (at least 3 per day), healthy meals and small healthy snacks if needed.  2) Eat a healthy  clean diet.   TRY TO EAT: -at least 5-7 servings of low sugar, colorful, and nutrient rich vegetables per day (not corn, potatoes or bananas.) -berries are the best choice if you wish to eat fruit (only eat small amounts if trying to reduce weight)  -lean meets (fish, white meat of chicken or [place]) -vegan proteins for some meals - beans or tofu, whole grains, nuts and seeds -Replace bad fats with good fats - good fats include: fish, nuts and seeds, canola oil, olive oil -small amounts of low fat or non fat dairy -small amounts of100 % whole grains - check the lables -drink plenty of  water  AVOID: -SUGAR, sweets, anything with added sugar, corn syrup or sweeteners - must read labels as even foods advertised as "healthy" often are loaded with sugar -if you must have a sweetener, small amounts of stevia may be best -sweetened beverages and artificially sweetened beverages -simple starches (rice, bread, potatoes, pasta, chips, etc - small amounts of 100% whole grains are ok) -red meat, pork, butter -fried foods, fast food, processed food, excessive dairy, eggs and coconut.  3)Get at least 150 minutes of sweaty aerobic exercise per week.  4)Reduce stress - consider counseling, meditation and relaxation to balance other aspects of your life.    Lucretia Kern, DO

## 2018-07-24 NOTE — Patient Instructions (Signed)
BEFORE YOU LEAVE: -labs -follow up: AWV with Manuela Schwartz and CPE with Dr. [name] in December  I changed the glipizide to the extended release, 5mg  once daily. STOP the other glipizide and use this instead.  I sent in the new dose of Potassium (Klor-con) that you requested (10mg  tablets to take 2 tablets daily)    We recommend the following healthy lifestyle for LIFE: 1) Small portions. But, make sure to get regular (at least 3 per day), healthy meals and small healthy snacks if needed.  2) Eat a healthy clean diet.   TRY TO EAT: -at least 5-7 servings of low sugar, colorful, and nutrient rich vegetables per day (not corn, potatoes or bananas.) -berries are the best choice if you wish to eat fruit (only eat small amounts if trying to reduce weight)  -lean meets (fish, white meat of chicken or [place]) -vegan proteins for some meals - beans or tofu, whole grains, nuts and seeds -Replace bad fats with good fats - good fats include: fish, nuts and seeds, canola oil, olive oil -small amounts of low fat or non fat dairy -small amounts of100 % whole grains - check the lables -drink plenty of water  AVOID: -SUGAR, sweets, anything with added sugar, corn syrup or sweeteners - must read labels as even foods advertised as "healthy" often are loaded with sugar -if you must have a sweetener, small amounts of stevia may be best -sweetened beverages and artificially sweetened beverages -simple starches (rice, bread, potatoes, pasta, chips, etc - small amounts of 100% whole grains are ok) -red meat, pork, butter -fried foods, fast food, processed food, excessive dairy, eggs and coconut.  3)Get at least 150 minutes of sweaty aerobic exercise per week.  4)Reduce stress - consider counseling, meditation and relaxation to balance other aspects of your life.

## 2018-07-25 LAB — BASIC METABOLIC PANEL
BUN: 10 mg/dL (ref 6–23)
CALCIUM: 9.8 mg/dL (ref 8.4–10.5)
CHLORIDE: 101 meq/L (ref 96–112)
CO2: 33 meq/L — AB (ref 19–32)
CREATININE: 0.62 mg/dL (ref 0.40–1.20)
GFR: 104.23 mL/min (ref >60.00)
GLUCOSE: 92 mg/dL (ref 70–99)
Potassium: 4.1 mEq/L (ref 3.5–5.1)
Sodium: 139 mEq/L (ref 135–145)

## 2018-07-25 LAB — MICROALBUMIN / CREATININE URINE RATIO
Creatinine,U: 96.3 mg/dL
Microalb Creat Ratio: 0.9 mg/g (ref 0.0–30.0)
Microalb, Ur: 0.9 mg/dL (ref 0.0–1.9)

## 2018-07-25 LAB — HEMOGLOBIN A1C: Hgb A1c MFr Bld: 6.7 % — ABNORMAL HIGH (ref 4.6–6.5)

## 2018-07-28 ENCOUNTER — Encounter: Payer: Self-pay | Admitting: Family Medicine

## 2018-07-29 ENCOUNTER — Other Ambulatory Visit: Payer: Self-pay | Admitting: *Deleted

## 2018-07-29 NOTE — Patient Outreach (Signed)
[facility]) Care Management  07/29/2018  Shelby Anderson August 08, 1956 [id number]  RN Health Coach attempted #2 follow up outreach call to patient.  Patient was unavailable. HIPPA compliance voicemail message left with return callback number.  Plan: RN will call patient again within 10 business days.  [facility] [phone]

## 2018-08-07 DIAGNOSIS — G4733 Obstructive sleep apnea (adult) (pediatric): Secondary | ICD-10-CM

## 2018-08-08 DIAGNOSIS — G4733 Obstructive sleep apnea (adult) (pediatric): Secondary | ICD-10-CM | POA: Diagnosis not present

## 2018-08-11 ENCOUNTER — Ambulatory Visit
Admission: RE | Admit: 2018-08-11 | Discharge: 2018-08-11 | Disposition: A | Discharge status: Home / Self Care | Payer: Medicare Other | Source: Ambulatory Visit | Attending: Family Medicine | Admitting: Family Medicine

## 2018-08-11 ENCOUNTER — Other Ambulatory Visit: Payer: Self-pay | Admitting: *Deleted

## 2018-08-11 ENCOUNTER — Telehealth: Payer: Self-pay | Admitting: Pulmonary Disease

## 2018-08-11 DIAGNOSIS — G4733 Obstructive sleep apnea (adult) (pediatric): Secondary | ICD-10-CM

## 2018-08-11 DIAGNOSIS — R0902 Hypoxemia: Secondary | ICD-10-CM

## 2018-08-11 DIAGNOSIS — Z1231 Encounter for screening mammogram for malignant neoplasm of breast: Secondary | ICD-10-CM | POA: Diagnosis not present

## 2018-08-11 NOTE — Telephone Encounter (Signed)
Dr. 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   Recommendations   Treatment options are CPAP with the settings auto 5 to 15.    Weight loss measures .   Advise against driving while sleepy & against medication with sedative side effects.    appointment for 8 to 10 weeks for compliance with download with Dr. Elsworth Soho.

## 2018-08-13 ENCOUNTER — Telehealth: Payer: Self-pay | Admitting: Pulmonary Disease

## 2018-08-13 DIAGNOSIS — G4733 Obstructive sleep apnea (adult) (pediatric): Secondary | ICD-10-CM

## 2018-08-13 NOTE — Telephone Encounter (Signed)
Schedule patient for an in lab titration study hold off on ordering CPAP till after titration

## 2018-08-13 NOTE — Patient Outreach (Signed)
[facility]) Care Management  08/13/2018   Shelby Anderson 1956/07/10 [id number]  RN Health Coach telephone call to patient.  [employer] compliance verified. Patient A1C is 6.7. Patient stated she is trying to eat better decreasing fried foods in her diet. Patient has not started an actual exercise routine. Patient stated the Dr doesn't want her to check her Fasting blood sugars. Patient is very concerned about getting a CPAP. RN discussed the benefits of a CPAP. Patient responded more positive at end of call.  Per patient she will follow up with getting flu shot. Patient has agreed to further outreach calls.  Current Medications:  Current Outpatient Medications  Medication Sig Dispense Refill  . albuterol (PROAIR HFA) 108 (90 Base) MCG/ACT inhaler Inhale 2 puffs into the lungs every 4 hours as needed for shortness of breath 8 g 3  . albuterol (PROVENTIL) (2.5 MG/3ML) 0.083% nebulizer solution Take 3 mLs (2.5 mg total) by nebulization every 6 (six) hours as needed for wheezing or shortness of breath (J45.40). 75 mL 3  . aspirin EC 81 MG tablet Take 81 mg at bedtime by mouth.     Marland Kitchen atorvastatin (LIPITOR) 40 MG tablet TAKE 1 TABLET BY MOUTH EVERY DAY 90 tablet 1  . budesonide-formoterol (SYMBICORT) 80-4.5 MCG/ACT inhaler Inhale 2 puffs into the lungs every 12 (twelve) hours. INHALE 2 PUFFS INTO THE LUNGS FIRST THING IN THE AM AND THEN ANOTHER 2 PUFFS 12 HOURS LATER 10.2 Inhaler 5  . Calcium Carb-Cholecalciferol (CALCIUM 600+D) 600-800 MG-UNIT TABS Take 1 tablet daily at 12 noon by mouth.     Marland Kitchen CARAFATE 1 GM/10ML suspension TAKE 10 MILLILITERS BY MOUTH 4 TIMES DAILY WITH MEALS AND AT BEDTIME 420 mL 2  . CINNAMON PO Take 1,000 mg 2 (two) times daily by mouth.    . Cyanocobalamin (VITAMIN B-12) 5000 MCG TBDP Take 5,000 mcg by mouth daily.     . diphenhydrAMINE (BENADRYL) 25 MG tablet Take 25 mg daily as needed by mouth (allergic reactions).    . docusate sodium (COLACE) 100 MG  capsule Take 1 capsule (100 mg total) by mouth 2 (two) times daily. (Patient taking differently: Take 100-200 mg by mouth daily as needed for mild constipation. ) 10 capsule 0  . esomeprazole (NEXIUM) 40 MG capsule Take 1 capsule (40 mg total) by mouth 2 (two) times daily. 60 capsule 11  . Eszopiclone (ESZOPICLONE) 3 MG TABS Take 3 mg by mouth at bedtime as needed (insomnia).     . fluticasone (FLONASE) 50 MCG/ACT nasal spray Place 2 sprays at bedtime as needed into both nostrils for allergies or rhinitis.     Marland Kitchen glipiZIDE (GLUCOTROL XL) 5 MG 24 hr tablet Take 1 tablet (5 mg total) by mouth daily with breakfast. 90 tablet 3  . hydrocortisone 2.5 % cream APPLY TO AFFECTED AREA TWICE A DAY. FOR DERMATITIS ON EARS FOR 2 WEEKS  3  . isosorbide mononitrate (IMDUR) 60 MG 24 hr tablet TAKE 1 TABLET BY MOUTH EVERY DAY 90 tablet 1  . L-Methylfolate (DEPLIN) 7.5 MG TABS Take 7.5 mg by mouth daily with breakfast.     . LYRICA 150 MG capsule TAKE 1 CAPSULE BY MOUTH EVERY 8 HOURS AS DIRECTED  2  . metFORMIN (GLUCOPHAGE) 500 MG tablet Take 1 tablet (500 mg total) by mouth daily with breakfast. 90 tablet 1  . methocarbamol (ROBAXIN) 500 MG tablet Take 1 tablet (500 mg total) by mouth every 6 (six) hours as needed for muscle spasms. (  Patient taking differently: Take 500 mg 3 (three) times daily as needed by mouth for muscle spasms. ) 60 tablet 0  . metoprolol tartrate (LOPRESSOR) 50 MG tablet TAKE 1 TABLET TWICE A DAY 180 tablet 1  . Misc Natural Products (GLUCOS-CHONDROIT-MSM COMPLEX PO) Take 1 tablet 3 (three) times daily by mouth.     . montelukast (SINGULAIR) 10 MG tablet TAKE 1 TABLET (10 MG TOTAL) BY MOUTH AT BEDTIME. 90 tablet 0  . Multiple Vitamin (MULITIVITAMIN WITH MINERALS) TABS Take 1 tablet by mouth daily.      . nitroGLYCERIN (NITROSTAT) 0.3 MG SL tablet Place 0.3 mg under the tongue every 5 (five) minutes as needed for chest pain (as needed).    . NONFORMULARY OR COMPOUNDED ITEM Estradiol .02% 1 ML  Prefilled Applicator Sig: apply vaginally twice a week #90 Day Supply with 4 refills 1 each 4  . NONFORMULARY OR COMPOUNDED ITEM [facility]:  Onychomycosis nail lacquer - fluconazole 2%, terbinafine 1%, DMSO apply to affected area daily. 120 each 2  . ondansetron (ZOFRAN) 4 MG tablet Take 1 tablet (4 mg total) by mouth every 4 (four) hours as needed for nausea or vomiting. 30 tablet 11  . Oxycodone HCl 20 MG TABS Take 20 mg every 4 (four) hours as needed by mouth (pain).     . OXYGEN Inhale 2 L into the lungs at bedtime.    . potassium chloride (KLOR-CON 10) 10 MEQ tablet Take 2 tablets (20 mEq total) by mouth daily. 180 tablet 1  . progesterone (PROMETRIUM) 200 MG capsule TAKE 1 CAPSULE BY MOUTH EVERY DAY FOR FIRST 12 DAYS OF MONTH 36 capsule 2  . promethazine (PHENERGAN) 25 MG tablet Take 1 tablet (25 mg total) by mouth every 6 (six) hours as needed for nausea or vomiting. 30 tablet 11  . triamcinolone cream (KENALOG) 0.1 % APPLY 1 APPLICATION TOPICALLY 2 (TWO) TIMES DAILY. 80 g 1   No current facility-administered medications for this visit.     Functional Status:  In your present state of health, do you have any difficulty performing the following activities: 05/09/2018 11/29/2017  Hearing? N N  Vision? - N  Difficulty concentrating or making decisions? - N  Comment - just forgets things   Walking or climbing stairs? - N  Dressing or bathing? - N  Doing errands, shopping? - N  Using the Toilet? - N  In the past six months, have you accidently leaked urine? - N  Do you have problems with loss of bowel control? - N  Comment - stomach issues   Managing your Medications? - N  Managing your Finances? - N  Housekeeping or managing your Housekeeping? - N  Some recent data might be hidden    Fall/Depression Screening: Fall Risk  05/09/2018 12/26/2017 11/29/2017  Falls in the past year? Yes Yes No  Number falls in past yr: 2 or more 2 or more -  Injury with Fall? No No -  Comment  bruising - -  Risk Factor Category  High Fall Risk - -  Risk for fall due to : History of fall(s);Impaired balance/gait;Impaired mobility - -  Follow up Falls evaluation completed;Education provided;Falls prevention discussed - -  Comment - - -   PHQ 2/9 Scores 05/09/2018 12/26/2017 11/29/2017 12/28/2016 11/15/2016 11/08/2016 01/05/2016  PHQ - 2 Score '3 3 4 2 6 6 6  ' PHQ- 9 Score '12 12 9 8 20 15 22   ' [facility] CM Care Plan Problem One  Most Recent Value  Care Plan Problem One  Knowledge Deficit in Self Management of Diabetes  Role Documenting the Problem One  [place] for Problem One  Active  THN Long Term Goal   Patient will have a better understanding of diabetes within the next 90 days  THN CM Short Term Goal #1   Patient will understand the signs and symptoms of hypo and hyperglycemia  THN CM Short Term Goal #1 Met Date  08/11/18  [facility]N CM Short Term Goal #2   Patient will see a decrease in A1C from 6.7  THN CM Short Term Goal #2 Start Date  08/11/18  [facility] CM Short Term Goal #2 Met Date  08/11/18  Interventions for Short Term Goal #2  Rn discussed what the patient A1C is. RN discussed eating healthy and trying to exercise will help. RN will follow up with further discussion  THN CM Short Term Goal #3  Patient will not have any falls within the next 30 days  THN CM Short Term Goal #3 Met Date  08/11/18  [facility] CM Short Term Goal #4  Patient will follow up with flu shot within the next 30 days  THN CM Short Term Goal #4 Start Date  08/12/18  Interventions for Short Term Goal #4  RN discussed getting the flu shot. Patient stated she would get the flu shot before the next outreach. RN will follow up for compliance  THN CM Short Term Goal #5   Patient will have a better understanding of sleep apnea and CPAP witi  THN CM Short Term Goal #5 Start Date  08/11/18  Interventions for Short Term Goal #5  RN discussed how th eCpap willhelp thepatient. RN sent educational material on CPAP. RN will  follow up with further discussion if needed       Assessment:  A1C is decreased from 7.1 to 6.7 Patient  does not have a regular exercise routine Patient is trying to eat healthier Per patient she is taking her medications as per ordered Per patient does not check blood sugars per Dr ordered    Plan:  RN discussed healthy eating RN discussed CPAP need RN sent educational material on CPAP Patient will follow up with getting flu shot Patient will follow up with scheduling an eye exam RN will follow up outreach within the month of November  Jamieson Hetland [place] [facility] [phone]

## 2018-08-14 ENCOUNTER — Ambulatory Visit: Payer: Medicare Other | Admitting: Podiatry

## 2018-08-14 DIAGNOSIS — E1151 Type 2 diabetes mellitus with diabetic peripheral angiopathy without gangrene: Secondary | ICD-10-CM

## 2018-08-14 DIAGNOSIS — B351 Tinea unguium: Secondary | ICD-10-CM

## 2018-08-14 NOTE — Progress Notes (Signed)
Subjective:  Patient ID: Shelby Anderson, female    DOB: October 31, 1956,  MRN: [id number]  Chief Complaint  Patient presents with  . debride    BL nail trimming     62 y.o. female presents  for diabetic foot care. Last AMBS was unknown. Denies numbness and tingling in their feet. Denies cramping in legs and thighs.  Review of Systems: Negative except as noted in the HPI. Denies N/V/F/Ch.  Past Medical History:  Diagnosis Date  . Allergy   . Anemia    "chronic"  . Angina   . Anxiety and depression   . Arthritis   . Asthma   . Atrial fibrillation ([place])    h/o "AF w/frequent PVCs"  . Cancer (HCC)    hx of skin cancer   . CHF (congestive heart failure) ([place])   . Chronic back pain greater than 3 months duration    on chronic narcotics, treated at pain clinic  . Colon polyps    hyperplastic  . Coronary artery disease    Arrythmia, orthostatic hypotension, HLD, HTN; sees Dr. Einar Gip  . Difficult intubation    "TMJ & woke up when they were still cutting on me"  . Dysrhythmia    sees Dr. Einar Gip and a cardiologist at [facility]  . Esophageal stricture   . Fatty liver   . Fibroids   . Fibromyalgia    "in my legs"  . GERD (gastroesophageal reflux disease)    hx hiatal hernia, stricture and gastric ulcer  . Headache(784.0)   . Heart murmur   . Hiatal hernia   . History of loop recorder   . History of migraines    "dx'd when I was in my teens"  . Hyperlipemia   . Hypertension   . Mental disorder   . Mild episode of recurrent major depressive disorder ([place]) 12/06/2015  . Myocardial infarction [facility]) 1980's & 1990;   sees Dr. Einar Gip  . Pneumonia   . PONV (postoperative nausea and vomiting)   . Recurrent upper respiratory infection (URI)   . Shortness of breath 11/20/11   "all the time", sees pulmonlogy, ? asthma  . Stenotic cervical os   . Stomach ulcer    "3 small; found in 05/2011"  . TMJ (dislocation of temporomandibular joint)   . Tuberculosis    + TB SKIN TEST  .  Type 2 diabetes mellitus without complication, without long-term current use of insulin ([place]) 12/06/2015   not on meds     Current Outpatient Medications:  .  albuterol (PROAIR HFA) 108 (90 Base) MCG/ACT inhaler, Inhale 2 puffs into the lungs every 4 hours as needed for shortness of breath, Disp: 8 g, Rfl: 3 .  albuterol (PROVENTIL) (2.5 MG/3ML) 0.083% nebulizer solution, Take 3 mLs (2.5 mg total) by nebulization every 6 (six) hours as needed for wheezing or shortness of breath (J45.40)., Disp: 75 mL, Rfl: 3 .  aspirin EC 81 MG tablet, Take 81 mg at bedtime by mouth. , Disp: , Rfl:  .  atorvastatin (LIPITOR) 40 MG tablet, TAKE 1 TABLET BY MOUTH EVERY DAY, Disp: 90 tablet, Rfl: 1 .  budesonide-formoterol (SYMBICORT) 80-4.5 MCG/ACT inhaler, Inhale 2 puffs into the lungs every 12 (twelve) hours. INHALE 2 PUFFS INTO THE LUNGS FIRST THING IN THE AM AND THEN ANOTHER 2 PUFFS 12 HOURS LATER, Disp: 10.2 Inhaler, Rfl: 5 .  Calcium Carb-Cholecalciferol (CALCIUM 600+D) 600-800 MG-UNIT TABS, Take 1 tablet daily at 12 noon by mouth. , Disp: , Rfl:  .  CARAFATE 1 GM/10ML suspension, TAKE 10 MILLILITERS BY MOUTH 4 TIMES DAILY WITH MEALS AND AT BEDTIME, Disp: 420 mL, Rfl: 2 .  CINNAMON PO, Take 1,000 mg 2 (two) times daily by mouth., Disp: , Rfl:  .  Cyanocobalamin (VITAMIN B-12) 5000 MCG TBDP, Take 5,000 mcg by mouth daily. , Disp: , Rfl:  .  diphenhydrAMINE (BENADRYL) 25 MG tablet, Take 25 mg daily as needed by mouth (allergic reactions)., Disp: , Rfl:  .  docusate sodium (COLACE) 100 MG capsule, Take 1 capsule (100 mg total) by mouth 2 (two) times daily. (Patient taking differently: Take 100-200 mg by mouth daily as needed for mild constipation. ), Disp: 10 capsule, Rfl: 0 .  esomeprazole (NEXIUM) 40 MG capsule, Take 1 capsule (40 mg total) by mouth 2 (two) times daily., Disp: 60 capsule, Rfl: 11 .  Eszopiclone (ESZOPICLONE) 3 MG TABS, Take 3 mg by mouth at bedtime as needed (insomnia). , Disp: , Rfl:  .   fluticasone (FLONASE) 50 MCG/ACT nasal spray, Place 2 sprays at bedtime as needed into both nostrils for allergies or rhinitis. , Disp: , Rfl:  .  glipiZIDE (GLUCOTROL XL) 5 MG 24 hr tablet, Take 1 tablet (5 mg total) by mouth daily with breakfast., Disp: 90 tablet, Rfl: 3 .  hydrocortisone 2.5 % cream, APPLY TO AFFECTED AREA TWICE A DAY. FOR DERMATITIS ON EARS FOR 2 WEEKS, Disp: , Rfl: 3 .  isosorbide mononitrate (IMDUR) 60 MG 24 hr tablet, TAKE 1 TABLET BY MOUTH EVERY DAY, Disp: 90 tablet, Rfl: 1 .  L-Methylfolate (DEPLIN) 7.5 MG TABS, Take 7.5 mg by mouth daily with breakfast. , Disp: , Rfl:  .  LYRICA 150 MG capsule, TAKE 1 CAPSULE BY MOUTH EVERY 8 HOURS AS DIRECTED, Disp: , Rfl: 2 .  metFORMIN (GLUCOPHAGE) 500 MG tablet, Take 1 tablet (500 mg total) by mouth daily with breakfast., Disp: 90 tablet, Rfl: 1 .  methocarbamol (ROBAXIN) 500 MG tablet, Take 1 tablet (500 mg total) by mouth every 6 (six) hours as needed for muscle spasms. (Patient taking differently: Take 500 mg 3 (three) times daily as needed by mouth for muscle spasms. ), Disp: 60 tablet, Rfl: 0 .  metoprolol tartrate (LOPRESSOR) 50 MG tablet, TAKE 1 TABLET TWICE A DAY, Disp: 180 tablet, Rfl: 1 .  Misc Natural Products (GLUCOS-CHONDROIT-MSM COMPLEX PO), Take 1 tablet 3 (three) times daily by mouth. , Disp: , Rfl:  .  montelukast (SINGULAIR) 10 MG tablet, TAKE 1 TABLET (10 MG TOTAL) BY MOUTH AT BEDTIME., Disp: 90 tablet, Rfl: 0 .  Multiple Vitamin (MULITIVITAMIN WITH MINERALS) TABS, Take 1 tablet by mouth daily.  , Disp: , Rfl:  .  nitroGLYCERIN (NITROSTAT) 0.3 MG SL tablet, Place 0.3 mg under the tongue every 5 (five) minutes as needed for chest pain (as needed)., Disp: , Rfl:  .  NONFORMULARY OR COMPOUNDED ITEM, Estradiol .02% 1 ML Prefilled Applicator Sig: apply vaginally twice a week #90 Day Supply with 4 refills, Disp: 1 each, Rfl: 4 .  NONFORMULARY OR COMPOUNDED ITEM, [facility]:  Onychomycosis nail lacquer - fluconazole 2%,  terbinafine 1%, DMSO apply to affected area daily., Disp: 120 each, Rfl: 2 .  ondansetron (ZOFRAN) 4 MG tablet, Take 1 tablet (4 mg total) by mouth every 4 (four) hours as needed for nausea or vomiting., Disp: 30 tablet, Rfl: 11 .  Oxycodone HCl 20 MG TABS, Take 20 mg every 4 (four) hours as needed by mouth (pain). , Disp: , Rfl:  .  OXYGEN, Inhale 2 L into the lungs at bedtime., Disp: , Rfl:  .  potassium chloride (KLOR-CON 10) 10 MEQ tablet, Take 2 tablets (20 mEq total) by mouth daily., Disp: 180 tablet, Rfl: 1 .  progesterone (PROMETRIUM) 200 MG capsule, TAKE 1 CAPSULE BY MOUTH EVERY DAY FOR FIRST 12 DAYS OF MONTH, Disp: 36 capsule, Rfl: 2 .  promethazine (PHENERGAN) 25 MG tablet, Take 1 tablet (25 mg total) by mouth every 6 (six) hours as needed for nausea or vomiting., Disp: 30 tablet, Rfl: 11 .  triamcinolone cream (KENALOG) 0.1 %, APPLY 1 APPLICATION TOPICALLY 2 (TWO) TIMES DAILY., Disp: 80 g, Rfl: 1  Social History   Tobacco Use  Smoking Status Never Smoker  Smokeless Tobacco Never Used    Allergies  Allergen Reactions  . Lodine [Etodolac] Anaphylaxis, Hives and Swelling  . Oxycontin [Oxycodone Hcl] Anaphylaxis    hives, trouble breathing, tongue swelling (Only Oxycontin) Tolerates plain oxycodone.  Marland Kitchen Penicillins Anaphylaxis    Told by a surgeon never to take it again. Has patient had a PCN reaction causing immediate rash, facial/tongue/throat swelling, SOB or lightheadedness with hypotension: Yes Has patient had a PCN reaction causing severe rash involving mucus membranes or skin necrosis: Unknown Has patient had a PCN reaction that required hospitalization: No Has patient had a PCN reaction occurring within the last 10 years: No If all of the above answers are "NO", then may proceed with Cephalosporin use.  . Aspirin Other (See Comments)    High-dose caused GI Bleeds  . Darvocet [Propoxyphene N-Acetaminophen] Hives  . Nitroglycerin     IV-BP drops dramatically  . Ultram  [Tramadol Hcl] Hives  . Valium Other (See Comments)    Circulation problems. "Legs turned black".   Objective:  There were no vitals filed for this visit. There is no height or weight on file to calculate BMI. Constitutional Well developed. Well nourished.  Vascular Dorsalis pedis pulses present 1+ bilaterally  Posterior tibial pulses absent bilaterally  Pedal hair growth diminished. Capillary refill normal to all digits.  No cyanosis or clubbing noted.  Neurologic Normal speech. Oriented to person, place, and time. Epicritic sensation to light touch grossly present bilaterally. Protective sensation with 5.07 monofilament  present bilaterally. Vibratory sensation present bilaterally.  Dermatologic Nails elongated, thickened, dystrophic. No open wounds. No skin lesions.  Orthopedic: Normal joint ROM without pain or crepitus bilaterally. No visible deformities. No bony tenderness.   Assessment:   1. Diabetes mellitus type 2 with peripheral artery disease ([place])   2. Onychomycosis    Plan:  Patient was evaluated and treated and all questions answered.  Diabetes with PAD, Onychomycosis -Educated on diabetic footcare. Diabetic risk level 1 -Nails x10 debrided sharply and manually with large nail nipper and rotary burr.   Procedure: Nail Debridement Rationale: Patient meets criteria for routine foot care due to PAD Type of Debridement: manual, sharp debridement. Instrumentation: Nail nipper, rotary burr. Number of Nails: 10   Return in about 3 months (around 11/13/2018) for Diabetic Foot Care.

## 2018-08-18 NOTE — Telephone Encounter (Signed)
Called the  Patient to make her aware of this and she is okay with this. I have also called an left a message for Melissa with [facility] to cancel CPAP for now till after study. Nothing further needed at this time.

## 2018-08-19 ENCOUNTER — Telehealth: Payer: Self-pay | Admitting: Pulmonary Disease

## 2018-08-19 NOTE — Telephone Encounter (Signed)
Message sent to [facility] to cancel the order

## 2018-08-19 NOTE — Telephone Encounter (Signed)
Please see 08/13/18 phone note.   Laurin Coder, MD    5:00 PM  Note    Schedule patient for an in lab titration study hold off on ordering CPAP till after titration        Pt is aware of above message and voiced her understanding.  Titration has been ordered.   PCC's can you guys cancel cpap order with [facility]. Thanks

## 2018-08-21 ENCOUNTER — Ambulatory Visit: Payer: Medicare Other | Admitting: Gynecology

## 2018-08-21 ENCOUNTER — Encounter: Payer: Self-pay | Admitting: Gynecology

## 2018-08-21 VITALS — BP 120/76 | Ht 65.0 in | Wt 231.0 lb

## 2018-08-21 DIAGNOSIS — N950 Postmenopausal bleeding: Secondary | ICD-10-CM

## 2018-08-21 DIAGNOSIS — N814 Uterovaginal prolapse, unspecified: Secondary | ICD-10-CM

## 2018-08-21 DIAGNOSIS — Z01411 Encounter for gynecological examination (general) (routine) with abnormal findings: Secondary | ICD-10-CM | POA: Diagnosis not present

## 2018-08-21 DIAGNOSIS — N952 Postmenopausal atrophic vaginitis: Secondary | ICD-10-CM | POA: Diagnosis not present

## 2018-08-21 DIAGNOSIS — Z1151 Encounter for screening for human papillomavirus (HPV): Secondary | ICD-10-CM

## 2018-08-21 NOTE — Progress Notes (Signed)
    Shelby Anderson [birth year] [id number]        62 y.o.  [id number] for annual gynecologic exam.  Former patient of Dr. Toney Anderson.  Complaining of intermittent spotting over the past year.  Has had an issue with this in the past.  Underwent sonohysterogram 2017 which was negative.  Had been using vaginal estrogen cream but has not used this for some time.  Still has some vaginal dryness.  Has been using the Prometrium 200 mg for the first 12 days of each month per Dr. Sandrea Anderson recommendation but no oral estrogen.  Past medical history,surgical history, problem list, medications, allergies, family history and social history were all reviewed and documented as reviewed in the EPIC chart.  ROS:  Performed with pertinent positives and negatives included in the history, assessment and plan.   Additional significant findings : None   Exam: Shelby Anderson assistant Vitals:   08/21/18 1520  BP: 120/76  Weight: 231 lb (104.8 kg)  Height: 5\' 5"  (1.651 m)   Body mass index is 38.44 kg/m.  General appearance:  Normal affect, orientation and appearance. Skin: Grossly normal HEENT: Without gross lesions.  No cervical or supraclavicular adenopathy. Thyroid normal.  Lungs:  Clear without wheezing, rales or rhonchi Cardiac: RR, without RMG Abdominal:  Soft, nontender, without masses, guarding, rebound, organomegaly or hernia Breasts:  Examined lying and sitting without masses, retractions, discharge or axillary adenopathy. Pelvic:  Ext, BUS, Vagina: With atrophic changes  Cervix: With atrophic changes.  Telescoping in the lower vagina.  Pap smear/HPV  Uterus: Anteverted, normal size, shape and contour, midline and mobile nontender   Adnexa: Without masses or tenderness    Anus and perineum: Normal   Rectovaginal: Normal sphincter tone without palpated masses or tenderness.    Assessment/Plan:  62 y.o. [id number] female for annual gynecologic exam.   1. Postmenopausal bleeding on and off with  spotting.  Had been using Prometrium intermittently.  Question whether this is spotting.  Recommend starting with sonohysterogram to clear the endometrium.  Stop the Prometrium now.  Hold on vaginal estrogen cream until after the sonohysterogram and then rediscuss the vaginal dryness issue. 2. Mammography 08/2018.  Continue with annual mammography next year.  Breast exam normal today. 3. DEXA 2016 normal.  Recommend follow-up DEXA at 5-year interval. 4. Colonoscopy 2012.  Repeat at their recommended interval. 5. Pap smear/HPV 07/2014.  Pap smear/HPV today.  No history of significant abnormal Pap smears. 6. Health maintenance.  No routine lab work done as patient reports this done elsewhere.  Follow-up for sonohysterogram.  Follow-up in 1 year for annual exam.   Shelby Auerbach MD, 3:55 PM 08/21/2018

## 2018-08-21 NOTE — Patient Instructions (Signed)
Follow up for ultrasound as scheduled 

## 2018-08-23 DIAGNOSIS — J45909 Unspecified asthma, uncomplicated: Secondary | ICD-10-CM | POA: Diagnosis not present

## 2018-08-25 LAB — PAP IG AND HPV HIGH-RISK: HPV DNA High Risk: NOT DETECTED

## 2018-08-26 ENCOUNTER — Other Ambulatory Visit: Payer: Self-pay | Admitting: Pulmonary Disease

## 2018-09-11 ENCOUNTER — Other Ambulatory Visit: Payer: Self-pay | Admitting: Adult Health

## 2018-09-11 ENCOUNTER — Other Ambulatory Visit: Payer: Self-pay | Admitting: Pulmonary Disease

## 2018-09-11 DIAGNOSIS — J45991 Cough variant asthma: Secondary | ICD-10-CM

## 2018-09-12 DIAGNOSIS — L218 Other seborrheic dermatitis: Secondary | ICD-10-CM | POA: Diagnosis not present

## 2018-09-12 DIAGNOSIS — L820 Inflamed seborrheic keratosis: Secondary | ICD-10-CM | POA: Diagnosis not present

## 2018-09-15 ENCOUNTER — Other Ambulatory Visit: Payer: Self-pay | Admitting: Gynecology

## 2018-09-15 DIAGNOSIS — N950 Postmenopausal bleeding: Secondary | ICD-10-CM

## 2018-09-16 ENCOUNTER — Other Ambulatory Visit: Payer: Self-pay | Admitting: Family Medicine

## 2018-09-17 ENCOUNTER — Other Ambulatory Visit: Payer: Self-pay | Admitting: Family Medicine

## 2018-09-18 ENCOUNTER — Ambulatory Visit (HOSPITAL_BASED_OUTPATIENT_CLINIC_OR_DEPARTMENT_OTHER): Payer: Medicare Other | Attending: Pulmonary Disease | Admitting: Pulmonary Disease

## 2018-09-18 VITALS — Ht 66.0 in | Wt 232.0 lb

## 2018-09-18 DIAGNOSIS — G4733 Obstructive sleep apnea (adult) (pediatric): Secondary | ICD-10-CM | POA: Diagnosis not present

## 2018-09-23 ENCOUNTER — Telehealth: Payer: Self-pay | Admitting: Pulmonary Disease

## 2018-09-23 NOTE — Telephone Encounter (Signed)
Sleep study results  DME referral  - CPAP therapy on 10 cm H2O with a Medium size Fisher[employer]Paykel Full Face Mask Simplus mask and heated humidification.  Follow-up as scheduled

## 2018-09-23 NOTE — Procedures (Signed)
POLYSOMNOGRAPHY  Last, First: Shelby Anderson, Shelby Anderson MRN: [id number] Gender: Female Age (years): 50 Weight (lbs): 232 DOB: 1956-07-30 BMI: 37 Primary Care: Lucretia Kern Epworth Score: 11 Referring: Laurin Coder MD Technician: Jorge Ny Interpreting: Laurin Coder MD Study Type: CPAP Ordered Study Type: CPAP Study date: 09/18/2018 Location: [facility] CLINICAL INFORMATION Shelby Anderson is a 62 year old Female and was referred to the sleep center for evaluation of Obstructive Sleep Apnea (327.23). Indications include Cardiac History, Cardiomyopathy, Diabetes (249.00), GERD, Hypotension, Morbid Obesity, OSA, Restless Sleep (307.90), Snoring (786.09).  MEDICATIONS Patient self administered medications include: ASPIRIN, ATORVASTATIN, CINNAMON PO, MEMESTASONE FUROATE CREAM, NEXIUM, LYRICA, METOPROLOL TARTRATE, MONTELUKAST, OXYCODONE HCL. Medications administered during study include No sleep medicine administered.  SLEEP STUDY TECHNIQUE The patient underwent an attended overnight polysomnography titration to assess the effects of CPAP therapy. The following variables were monitored: EEG(C4-A1, C3-A2, O1-A2, O2-A1), EOG, submental and leg EMG, ECG, oxyhemoglobin saturation by pulse oximetry, thoracic and abdominal respiratory effort belts, nasal/oral airflow by pressure sensor, body position sensor and snoring sensor. CPAP pressure was titrated to eliminate apneas, hypopneas and oxygen desaturation. Hypopneas were scored per AASM definition IB (4% desaturation)  TECHNICIAN COMMENTS Comments added by Technician: NONE Comments added by Scorer: N/A SLEEP ARCHITECTURE The study was initiated at 11:17:11 PM and terminated at 6:12:47 AM. Total recorded time was 415.6 minutes. EEG confirmed total sleep time was 339 minutes yielding a sleep efficiency of 81.6%%. Sleep onset after lights out was 7.4 minutes with a REM latency of 103.0 minutes. The patient spent 3.4%% of the night in  stage N1 sleep, 77.0%% in stage N2 sleep, 0.0%% in stage N3 and 19.6% in REM. The Arousal Index was 14.9/hour. RESPIRATORY PARAMETERS The overall AHI was 2.1 per hour, and the RDI was 2.8 events/hour with a central apnea index of 0.0per hour. The most appropriate setting of CPAP was 10 cm H2O. At this setting, the sleep efficiency was 5% and the patient was supine for 100%. The AHI was 0.0 events per hour, and the RDI was 0.0 events/hour (with 0.0 central events) and the arousal index was 37.5 per hour.The oxygen nadir was 90.0% during sleep.    The cumulative time under 88% oxygen saturation was 5.5 minutes  LEG MOVEMENT DATA The total leg movements were 0 with a resulting leg movement index of 0.0/hr. Associated arousal with leg movement index was 0.0/hr. CARDIAC DATA The underlying cardiac rhythm was most consistent with sinus rhythm. Mean heart rate during sleep was 53.6 bpm. Additional rhythm abnormalities include None.   IMPRESSIONS - EKG showed no cardiac abnormalities. - No snoring was audible during this study. - No Significant Obstructive Sleep apnea(OSA) Optimal pressure attained. - No Significant Central Sleep Apnea (CSA) - No Significant Upper Airway Resistance Syndrome(UARS). - No significant periodic leg movements(PLMs) during sleep. However, no significant associated arousals. - Reduced sleep efficiency, normal primary sleep latency, normal REM sleep latency and no slow wave latency.   DIAGNOSIS - Obstructive Sleep Apnea (327.23 [G47.33 ICD-10])   RECOMMENDATIONS - CPAP therapy on 10 cm H2O with a Medium size Fisher&Paykel Full Face Mask Simplus mask and heated humidification. - Avoid alcohol, sedatives and other CNS depressants that may worsen sleep apnea and disrupt normal sleep architecture. - Sleep hygiene should be reviewed to assess factors that may improve sleep quality. - Weight management and regular exercise should be initiated or continued. - Return to  Sleep Center for re-evaluation after 4 weeks of therapy  [Electronically signed] 09/23/2018 08:23 AM  Sherrilyn Rist MD NPI: [id number]

## 2018-09-23 NOTE — Telephone Encounter (Signed)
Attempted to call patient but was unable to reach the phone keep ringing no voice mail. Will call back.

## 2018-09-25 NOTE — Telephone Encounter (Signed)
Attempted to call patient but was unable to reach the phone keep ringing no voice mail. Will call back X2

## 2018-09-26 NOTE — Telephone Encounter (Signed)
Attempted to call patient but was unable to reach the phone keep ringing no voice mail. Will call back X3

## 2018-09-29 ENCOUNTER — Other Ambulatory Visit: Payer: Medicare Other

## 2018-09-29 ENCOUNTER — Ambulatory Visit: Payer: Medicare Other | Admitting: Gynecology

## 2018-10-08 ENCOUNTER — Encounter: Payer: Self-pay | Admitting: Podiatry

## 2018-10-09 ENCOUNTER — Encounter: Payer: Self-pay | Admitting: Pulmonary Disease

## 2018-10-09 NOTE — Telephone Encounter (Signed)
Closing the message and sending a letter pre protocol.

## 2018-10-13 ENCOUNTER — Encounter: Payer: Self-pay | Admitting: *Deleted

## 2018-10-14 ENCOUNTER — Encounter: Payer: Self-pay | Admitting: Podiatry

## 2018-10-15 ENCOUNTER — Encounter: Payer: Self-pay | Admitting: Pulmonary Disease

## 2018-10-15 ENCOUNTER — Ambulatory Visit: Payer: Medicare Other | Admitting: Pulmonary Disease

## 2018-10-15 DIAGNOSIS — J45991 Cough variant asthma: Secondary | ICD-10-CM

## 2018-10-15 DIAGNOSIS — I420 Dilated cardiomyopathy: Secondary | ICD-10-CM | POA: Diagnosis not present

## 2018-10-15 DIAGNOSIS — G4733 Obstructive sleep apnea (adult) (pediatric): Secondary | ICD-10-CM

## 2018-10-15 DIAGNOSIS — Z23 Encounter for immunization: Secondary | ICD-10-CM | POA: Diagnosis not present

## 2018-10-15 NOTE — Assessment & Plan Note (Signed)
Prescription for CPAP 10 cm with a Medium size Fisher&Paykel Full Face Mask Simplus mask and heated humidification will be sent to DME. Expectation is that you use this for at least 4 hours every night

## 2018-10-15 NOTE — Progress Notes (Signed)
Subjective:    Patient ID: Shelby Anderson, female    DOB: 04/26/1956, 62 y.o.   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  PMH -syncope &has a loop recorder - brady &int a - fibn pulm hypertension Her husband smokes. She has positive PPD.  Chief Complaint  Patient presents with  . Follow-up    Pt has increase of SOB, wheezing, prod cough-clear, and chest pain. Pt has had fever on and off in last 3 days with voice change.    Her breathing is worse, she reports intermittent wheezing, clear sputum production.  She has been hoarse for about a week.  She reports occasional low-grade fevers.  Weight is unchanged. Echo 01/2017 had shown evidence of cardiomyopathy with pulmonary hypertension, this was repeated, and did not have follow-up results but she was told that she has decreased LV function but medications were not changed.  I reviewed sleep studies including a home study and CPAP titration with the patient.  Chest x-ray from 07/2018 showed clear lungs  She has a history of esophageal stricture which was dilated, she remains on Nexium twice daily .  She is compliant with Symbicort She reports frequent clear sinus drip  Significant tests/ events reviewed  Spirometry 12/2011 - no airway obstruction -No reversibility with bronchodilator , mod restriction Spirometry 07/2018 FVC 1.64 (46%), FEV1 1.34 (49%), ratio 82 (104%)   07/2018 FENO 11  Home sleep test 07/2012 - showed desatn,no OSA  11/2015 ONO positive , Started on oxygen 2l/m .  10/2016 CT angio neg PE  CT sinus 01/2017 >> clear  12/2013 -admitted asthma exacerbation,CAP, RSV bronchitis.  EGD 03/2011 ([facility]) - peptic ulcer disease.  12/2013 barium esophagram - distal esophageal stricture,stricture dilated again [id number] ([facility]) 01/2017  Swallow >> Persistent/recurrent  stricture at the GE junction   HST 08/2018 AHI 9/h 09/2018 >> CPAP 10 cm, med FF mask  Past Medical History:  Diagnosis Date  . Allergy   . Anemia    "chronic"  . Angina   . Anxiety and depression   . Arthritis   . Asthma   . Atrial fibrillation ([place])    h/o "AF w/frequent PVCs"  . Cancer (HCC)    hx of skin cancer   . CHF (congestive heart failure) ([place])   . Chronic back pain greater than 3 months duration    on chronic narcotics, treated at pain clinic  . Colon polyps    hyperplastic  . Coronary artery disease    Arrythmia, orthostatic hypotension, HLD, HTN; sees Dr. Einar Gip  . Difficult intubation    "TMJ & woke up when they were still cutting on me"  . Dysrhythmia    sees Dr. Einar Gip and a cardiologist at [facility]  . Esophageal stricture   . Fatty liver   . Fibroids   . Fibromyalgia    "in my legs"  . GERD (gastroesophageal reflux disease)    hx hiatal hernia, stricture and gastric ulcer  . Headache(784.0)   . Heart murmur   . Hiatal hernia   . History of loop recorder   . History of migraines    "dx'd when I was in my teens"  . Hyperlipemia   . Hypertension   . Mental disorder   . Mild episode of recurrent major depressive disorder ([place]) 12/06/2015  . Myocardial infarction ([place]) 1980's &  1990;   sees Dr. Einar Gip  . OSA (obstructive sleep apnea)   . Pneumonia   . PONV (postoperative nausea and vomiting)   . Recurrent upper respiratory infection (URI)   . Shortness of breath 11/20/11   "all the time", sees pulmonlogy, ? asthma  . Stenotic cervical os   . Stomach ulcer    "3 small; found in 05/2011"  . TMJ (dislocation of temporomandibular joint)   . Tuberculosis    + TB SKIN TEST  . Type 2 diabetes mellitus without complication, without long-term current use of insulin ([place]) 12/06/2015   not on meds      Review of Systems neg for any significant sore throat, dysphagia, itching, sneezing, nasal congestion or excess/ purulent secretions, fever, chills,  sweats, unintended wt loss, pleuritic or exertional cp, hempoptysis, orthopnea pnd or change in chronic leg swelling. Also denies presyncope, palpitations, heartburn, abdominal pain, nausea, vomiting, diarrhea or change in bowel or urinary habits, dysuria,hematuria, rash, arthralgias, visual complaints, headache, numbness weakness or ataxia.     Objective:   Physical Exam   Obese, no distress, "today's a good day" No JVD, no pallor or icterus Decreased breath sounds bilateral, no rhonchi S1-S2 normal 1+ edema Grossly nonfocal         Assessment & Plan:

## 2018-10-15 NOTE — Patient Instructions (Addendum)
Obtain echo results from Dr. Einar Gip.  Prescription for CPAP 10 cm with a Medium size Fisher&Paykel Full Face Mask Simplus mask and heated humidification will be sent to DME. Expectation is that you use this for at least 4 hours every night   Flu shot today. Take store brand Sudafed daily for 2 weeks to dry up sinuses. If no better or persistent wheezing, call us back and we will increase Symbicort 160

## 2018-10-15 NOTE — Addendum Note (Signed)
Addended by: Valerie Salts on: 10/15/2018 04:15 PM   Modules accepted: Orders

## 2018-10-15 NOTE — Assessment & Plan Note (Signed)
Flu shot today. Take store brand Sudafed daily for 2 weeks to dry up sinuses. If no better or persistent wheezing, call us back and we will increase Symbicort 160

## 2018-10-15 NOTE — Assessment & Plan Note (Signed)
Obtain echo results from Dr. Einar Gip.

## 2018-10-19 ENCOUNTER — Other Ambulatory Visit: Payer: Self-pay | Admitting: Family Medicine

## 2018-10-22 ENCOUNTER — Other Ambulatory Visit: Payer: Self-pay | Admitting: Internal Medicine

## 2018-10-22 DIAGNOSIS — M47816 Spondylosis without myelopathy or radiculopathy, lumbar region: Secondary | ICD-10-CM | POA: Diagnosis not present

## 2018-10-22 DIAGNOSIS — S9422XS Injury of deep peroneal nerve at ankle and foot level, left leg, sequela: Secondary | ICD-10-CM | POA: Diagnosis not present

## 2018-10-22 DIAGNOSIS — G894 Chronic pain syndrome: Secondary | ICD-10-CM | POA: Diagnosis not present

## 2018-10-22 DIAGNOSIS — Z79891 Long term (current) use of opiate analgesic: Secondary | ICD-10-CM | POA: Diagnosis not present

## 2018-10-28 ENCOUNTER — Ambulatory Visit (INDEPENDENT_AMBULATORY_CARE_PROVIDER_SITE_OTHER): Payer: Medicare Other | Admitting: Gynecology

## 2018-10-28 ENCOUNTER — Encounter: Payer: Self-pay | Admitting: Gynecology

## 2018-10-28 ENCOUNTER — Other Ambulatory Visit: Payer: Self-pay | Admitting: Gynecology

## 2018-10-28 ENCOUNTER — Ambulatory Visit (INDEPENDENT_AMBULATORY_CARE_PROVIDER_SITE_OTHER): Payer: Medicare Other

## 2018-10-28 VITALS — BP 124/80

## 2018-10-28 DIAGNOSIS — N950 Postmenopausal bleeding: Secondary | ICD-10-CM

## 2018-10-28 DIAGNOSIS — D251 Intramural leiomyoma of uterus: Secondary | ICD-10-CM

## 2018-10-28 DIAGNOSIS — R21 Rash and other nonspecific skin eruption: Secondary | ICD-10-CM

## 2018-10-28 MED ORDER — NYSTATIN-TRIAMCINOLONE 100000-0.1 UNIT/GM-% EX CREA: 1.0000 "application " | TOPICAL_CREAM | Freq: Two times a day (BID) | CUTANEOUS | 0 refills | Status: DC

## 2018-10-28 NOTE — Progress Notes (Signed)
    Shelby Anderson [birth year] [id number]        62 y.o.  [id number] presents for sonohysterogram due to spotting on and off using vaginal estrogen and oral Prometrium for the first 12 days of each month per Dr. Toney Rakes.  No systemic estrogen use.  Past medical history,surgical history, problem list, medications, allergies, family history and social history were all reviewed and documented in the EPIC chart.  Directed ROS with pertinent positives and negatives documented in the history of present illness/assessment and plan.  Exam: Shelby Anderson assistant BP 124/80 General appearance:  Normal Abdomen soft nontender without masses guarding rebound Pelvic external BUS vagina with classic fungal type rash on her inner thighs.  Atrophic vaginal changes noted.  Cervix with atrophic changes.  Uterus grossly normal midline mobile nontender.  Adnexa without masses or tenderness.  Ultrasound transvaginal and transabdominal shows uterus normal size and echotexture.  Several small myomas noted 12 x 11 mm and 8 x 7 mm.  Endometrial echo 3.0 mm.  Right and left ovaries visualized.  Cul-de-sac negative.  Sonohysterogram performed, sterile technique, easy catheter introduction, good distention with no abnormalities.  Endometrial sample taken.  Patient tolerated well.  Assessment/Plan:  62 y.o. [id number] with vaginal spotting.  Sonohysterogram shows empty cavity with thin endometrial echo.  Had been on vaginal estrogen cream but has stopped it.  Not really having issues with dryness.  Also was taking progesterone tablets but discontinued these at my recommendation.  Will follow for now and as long as no further spotting will follow.  She does not want to restart the estrogen cream.  I did discuss OTC options like Replens if dryness persists.  She will call if vaginal spotting persists.  She has a classic appearing fungal rash of the thighs.  No significant vaginal discharge or irritation noted.  Recommend Mycolog  cream twice daily as needed to the rash.  Follow-up if persists.    Anastasio Auerbach MD, 4:47 PM 10/28/2018

## 2018-10-28 NOTE — Patient Instructions (Addendum)
Office will call with biopsy results  Use the prescribed cream twice weekly on the rash.

## 2018-11-04 ENCOUNTER — Telehealth: Payer: Self-pay | Admitting: *Deleted

## 2018-11-04 MED ORDER — NYSTATIN 100000 UNIT/GM EX CREA: 1.0000 "application " | TOPICAL_CREAM | Freq: Two times a day (BID) | CUTANEOUS | 0 refills | Status: DC

## 2018-11-04 MED ORDER — TRIAMCINOLONE ACETONIDE 0.1 % EX CREA: 1.0000 "application " | TOPICAL_CREAM | Freq: Two times a day (BID) | CUTANEOUS | 0 refills | Status: DC

## 2018-11-04 NOTE — Telephone Encounter (Signed)
Pharmacy called stating the nystatin-triamcinolone cream combination is not covered, however if medication sent separate it will be. Rx sent for nystatin and triamcinolone cream.

## 2018-11-05 ENCOUNTER — Telehealth: Payer: Self-pay

## 2018-11-05 NOTE — Telephone Encounter (Signed)
Call to [facility] and cancelled AWV on 12/30 Stated this had to be done by the end of the year per her insurance.  Asked her to call this week and schedule AWV with Dr. [name] and she will fup Wynetta Fines RN

## 2018-11-11 DIAGNOSIS — J45909 Unspecified asthma, uncomplicated: Secondary | ICD-10-CM | POA: Diagnosis not present

## 2018-11-11 DIAGNOSIS — J4540 Moderate persistent asthma, uncomplicated: Secondary | ICD-10-CM | POA: Diagnosis not present

## 2018-11-11 DIAGNOSIS — J189 Pneumonia, unspecified organism: Secondary | ICD-10-CM | POA: Diagnosis not present

## 2018-11-11 DIAGNOSIS — G4733 Obstructive sleep apnea (adult) (pediatric): Secondary | ICD-10-CM | POA: Diagnosis not present

## 2018-11-11 DIAGNOSIS — R0902 Hypoxemia: Secondary | ICD-10-CM | POA: Diagnosis not present

## 2018-11-13 ENCOUNTER — Ambulatory Visit: Payer: Medicare Other | Admitting: Podiatry

## 2018-11-13 DIAGNOSIS — E1151 Type 2 diabetes mellitus with diabetic peripheral angiopathy without gangrene: Secondary | ICD-10-CM | POA: Diagnosis not present

## 2018-11-13 DIAGNOSIS — B351 Tinea unguium: Secondary | ICD-10-CM

## 2018-11-13 NOTE — Patient Instructions (Signed)

## 2018-11-17 NOTE — Telephone Encounter (Signed)
Pt called and stated that she needs appointment before end of the year. Pt would like to know if she could be worked in. Please advise Cb#[phone]

## 2018-11-18 NOTE — Telephone Encounter (Signed)
If AWV only ok to block 2 15 minute slots - just let pt know if preventive visit only and working her in. Thanks.

## 2018-11-18 NOTE — Telephone Encounter (Signed)
I called the pt and scheduled an appt for 12/26.

## 2018-11-20 ENCOUNTER — Other Ambulatory Visit: Payer: Self-pay | Admitting: Gynecology

## 2018-11-24 ENCOUNTER — Other Ambulatory Visit: Payer: Self-pay | Admitting: Family Medicine

## 2018-11-24 NOTE — Progress Notes (Deleted)
Medicare Annual Preventive Care Visit  (initial annual wellness or annual wellness exam)  Concerns and/or follow up today:  ROSAISELA Anderson is a pleasant 62 y.o. here for follow up. Chronic medical problems summarized below were reviewed for changes and stability and were updated as needed below. These issues and their treatment remain stable for the most part. ***. Denies CP, SOB, DOE, treatment intolerance or new symptoms. Due for labs, depression screen. Had pap 9/19 with gyn -colonoscopy was 12/2010 -had mammo 08/2018  Diabetes Mellitus: -takes cinnamon, glipizide, metformin added 04/2018 -no regular exercise, but she is active taking care of grandchildren -hx CAD (sees cardiologist), HLD, HTN, orthostattic hypotension - on statin and asa, not on acei - has asthma  GERD/constipation: -hx bad reflux, esophageal stricture s/p dilation and gastric ulcer -sees Dr. Henrene Pastor for this  -on nexium 40mg  bid, carafate, prn phenergan, colace  CAD hx MI x2 per her report/Arrythmia/HTN/HLD/orthostatic hypotension/presyncope: -seeing several cardiologists -meds: asa,statin,metoprolol, fish oil, isosorbid, K+, ntg prn -denies: CP, SOB, palpitations -on florinef in the past  Asthma/Allergies: -sees pulmonologist for this -meds: singulair, symbicort, albuterol, flonase  Insomnia: -uses benydrl occ for this -eszopiclone on med list from ? [facility]-reports she very rarely takes this, may be a few times per year. -hx of low O2 at night  Chronic back pain/Knee pain: -hx DDD and neuropathy, OA knees -seeing [facility] specialist and referred to ortho -on lyrica, deplin, robaxin -on methadone in the past and opana - reports reacted to them -aware of risks with medications, adamant about continuing mobic as feels is only medication that helps  MDD: -reports on deplin from pain management for this, does not feel helps -declined other treatments offered here -Chronic  symptoms: mildly constant depressed mood and hopeless feelings at times, no SI, panic or manic symptoms  HRT/postmenopausl bleeding: -on prometrium with her gynecologist, Dr. Toney Rakes  See HM section in Epic for other details of completed HM. See scanned documentation under Media Tab for further documentation HPI, health risk assessment. See Media Tab and Care Teams sections in Epic for other providers.  ROS: negative for report of fevers, unintentional weight loss, vision changes, vision loss, hearing loss or change, chest pain, sob, hemoptysis, melena, hematochezia, hematuria, genital discharge or lesions, falls, bleeding or bruising, loc, thoughts of suicide or self harm, memory loss  1.) Patient-completed health risk assessment  - completed and reviewed, see scanned documentation  2.) Review of Medical History: -PMH, PSH, Family History and current specialty and care providers reviewed and updated and listed below  - see scanned in document in chart and below  Past Medical History:  Diagnosis Date  . Allergy   . Anemia    "chronic"  . Angina   . Anxiety and depression   . Arthritis   . Asthma   . Atrial fibrillation ([place])    h/o "AF w/frequent PVCs"  . Cancer (HCC)    hx of skin cancer   . CHF (congestive heart failure) ([place])   . Chronic back pain greater than 3 months duration    on chronic narcotics, treated at pain clinic  . Colon polyps    hyperplastic  . Coronary artery disease    Arrythmia, orthostatic hypotension, HLD, HTN; sees Dr. Einar Gip  . Difficult intubation    "TMJ & woke up when they were still cutting on me"  . Dysrhythmia    sees Dr. Einar Gip and a cardiologist at [facility]  . Esophageal stricture   . Fatty  liver   . Fibroids   . Fibromyalgia    "in my legs"  . GERD (gastroesophageal reflux disease)    hx hiatal hernia, stricture and gastric ulcer  . Headache(784.0)   . Heart murmur   . Hiatal hernia   . History of loop recorder   . History of  migraines    "dx'd when I was in my teens"  . Hyperlipemia   . Hypertension   . Mental disorder   . Mild episode of recurrent major depressive disorder ([place]) 12/06/2015  . Myocardial infarction [facility]) 1980's & 1990;   sees Dr. Einar Gip  . OSA (obstructive sleep apnea)   . Pneumonia   . PONV (postoperative nausea and vomiting)   . Recurrent upper respiratory infection (URI)   . Shortness of breath 11/20/11   "all the time", sees pulmonlogy, ? asthma  . Stenotic cervical os   . Stomach ulcer    "3 small; found in 05/2011"  . TMJ (dislocation of temporomandibular joint)   . Tuberculosis    + TB SKIN TEST  . Type 2 diabetes mellitus without complication, without long-term current use of insulin ([place]) 12/06/2015   not on meds     Past Surgical History:  Procedure Laterality Date  . ACHILLES TENDON REPAIR  1970's   left ankle  . ARTHROSCOPIC REPAIR ACL     left knee cap  . BREAST BIOPSY Right 04/08/2013  . CARDIAC CATHETERIZATION     loop recorder  . CARPAL TUNNEL RELEASE  unknown   left hand  . ESOPHAGOGASTRODUODENOSCOPY (EGD) WITH PROPOFOL N/A 10/29/2017   Procedure: ESOPHAGOGASTRODUODENOSCOPY (EGD) WITH PROPOFOL;  Surgeon: Irene Shipper, MD;  Location: [facility] ENDOSCOPY;  Service: Endoscopy;  Laterality: N/A;  . LOOP RECORDER IMPLANT    . post ganglionectomy  1970's   "for migraine headaches"  . pouch string  [phone]   "did this 3 times (once w/each pregnancy)"  . SAVORY DILATION N/A 10/29/2017   Procedure: SAVORY DILATION;  Surgeon: Irene Shipper, MD;  Location: [facility];  Service: Endoscopy;  Laterality: N/A;  . TOTAL KNEE ARTHROPLASTY Left 09/25/2016   Procedure: LEFT TOTAL KNEE ARTHROPLASTY;  Surgeon: Paralee Cancel, MD;  Location: [facility] ORS;  Service: Orthopedics;  Laterality: Left;  . TUBAL LIGATION  1980's    Social History   Socioeconomic History  . Marital status: Married    Spouse name: Not on file  . Number of children: 3  . Years of education: Not on file  . Highest  education level: Not on file  Occupational History  . Occupation: Retired Animal nutritionist  . Financial resource strain: Not on file  . Food insecurity:    Worry: Not on file    Inability: Not on file  . Transportation needs:    Medical: Not on file    Non-medical: Not on file  Tobacco Use  . Smoking status: Never Smoker  . Smokeless tobacco: Never Used  Substance and Sexual Activity  . Alcohol use: No    Alcohol/week: 0.0 standard drinks  . Drug use: No  . Sexual activity: Not Currently    Birth control/protection: Surgical    Comment: 1st intercourse 62 yo-Fewer than 5 partners  Lifestyle  . Physical activity:    Days per week: Not on file    Minutes per session: Not on file  . Stress: Not on file  Relationships  . Social connections:    Talks on phone: Not on file    Gets  together: Not on file    Attends religious service: Not on file    Active member of club or organization: Not on file    Attends meetings of clubs or organizations: Not on file    Relationship status: Not on file  . Intimate partner violence:    Fear of current or ex partner: Not on file    Emotionally abused: Not on file    Physically abused: Not on file    Forced sexual activity: Not on file  Other Topics Concern  . Not on file  Social History Narrative  . Not on file    Family History  Problem Relation Age of Onset  . Malignant hyperthermia Father   . Hypertension Father   . Heart disease Father   . Diabetes Father   . Cancer Father        skin  . Hypertension Mother   . Heart disease Mother   . Cancer Sister        CERVICAL  . Hypertension Sister   . Cancer Brother        MELANOMA  . Heart disease Maternal Grandmother   . Heart disease Maternal Grandfather   . Cancer Paternal Grandmother        ?   Marland Kitchen Heart disease Paternal Grandmother   . Heart disease Paternal Grandfather   . Cancer Brother        LUNG  . Diabetes Sister   . Hypertension Sister   . Heart disease Sister    . Cancer Sister   . Cancer Brother   . Anesthesia problems Neg Hx   . Hypotension Neg Hx   . Pseudochol deficiency Neg Hx   . Colon cancer Neg Hx   . Esophageal cancer Neg Hx   . Pancreatic cancer Neg Hx   . Rectal cancer Neg Hx   . Stomach cancer Neg Hx   . Breast cancer Neg Hx     Current Outpatient Medications on File Prior to Visit  Medication Sig Dispense Refill  . albuterol (PROVENTIL) (2.5 MG/3ML) 0.083% nebulizer solution Take 3 mLs (2.5 mg total) by nebulization every 6 (six) hours as needed for wheezing or shortness of breath (J45.40). 75 mL 3  . aspirin EC 81 MG tablet Take 81 mg at bedtime by mouth.     Marland Kitchen atorvastatin (LIPITOR) 40 MG tablet TAKE 1 TABLET BY MOUTH EVERY DAY 90 tablet 1  . budesonide-formoterol (SYMBICORT) 80-4.5 MCG/ACT inhaler Inhale 2 puffs into the lungs every 12 (twelve) hours. INHALE 2 PUFFS INTO THE LUNGS FIRST THING IN THE AM AND THEN ANOTHER 2 PUFFS 12 HOURS LATER 10.2 Inhaler 5  . budesonide-formoterol (SYMBICORT) 80-4.5 MCG/ACT inhaler INHALE 2 PUFFS INTO THE LUNGS FIRST THING IN THE AM AND THEN ANOTHER 2 PUFFS 12 HOURS LATER 30.6 Inhaler 1  . Calcium Carb-Cholecalciferol (CALCIUM 600+D) 600-800 MG-UNIT TABS Take 1 tablet daily at 12 noon by mouth.     Marland Kitchen CARAFATE 1 GM/10ML suspension TAKE 10 MILLILITERS BY MOUTH 4 TIMES DAILY WITH MEALS AND AT BEDTIME 420 mL 2  . CINNAMON PO Take 1,000 mg 2 (two) times daily by mouth.    . Cyanocobalamin (VITAMIN B-12) 5000 MCG TBDP Take 5,000 mcg by mouth daily.     . diphenhydrAMINE (BENADRYL) 25 MG tablet Take 25 mg daily as needed by mouth (allergic reactions).    . docusate sodium (COLACE) 100 MG capsule Take 1 capsule (100 mg total) by mouth 2 (two) times daily. (Patient taking  differently: Take 100-200 mg by mouth daily as needed for mild constipation. ) 10 capsule 0  . esomeprazole (NEXIUM) 40 MG capsule Take 1 capsule (40 mg total) by mouth 2 (two) times daily. 60 capsule 11  . Eszopiclone (ESZOPICLONE) 3 MG  TABS Take 3 mg by mouth at bedtime as needed (insomnia).     . fluticasone (FLONASE) 50 MCG/ACT nasal spray Place 2 sprays at bedtime as needed into both nostrils for allergies or rhinitis.     Marland Kitchen glipiZIDE (GLUCOTROL XL) 5 MG 24 hr tablet Take 1 tablet (5 mg total) by mouth daily with breakfast. 90 tablet 3  . hydrocortisone 2.5 % cream APPLY TO AFFECTED AREA TWICE A DAY. FOR DERMATITIS ON EARS FOR 2 WEEKS  3  . isosorbide mononitrate (IMDUR) 60 MG 24 hr tablet TAKE 1 TABLET BY MOUTH EVERY DAY 90 tablet 1  . L-Methylfolate (DEPLIN) 7.5 MG TABS Take 7.5 mg by mouth daily with breakfast.     . LYRICA 150 MG capsule TAKE 1 CAPSULE BY MOUTH EVERY 8 HOURS AS DIRECTED  2  . metFORMIN (GLUCOPHAGE) 500 MG tablet TAKE 1 TABLET BY MOUTH EVERY DAY WITH BREAKFAST 90 tablet 1  . methocarbamol (ROBAXIN) 500 MG tablet Take 1 tablet (500 mg total) by mouth every 6 (six) hours as needed for muscle spasms. (Patient taking differently: Take 500 mg 3 (three) times daily as needed by mouth for muscle spasms. ) 60 tablet 0  . metoprolol tartrate (LOPRESSOR) 50 MG tablet TAKE 1 TABLET TWICE A DAY 180 tablet 1  . Misc Natural Products (GLUCOS-CHONDROIT-MSM COMPLEX PO) Take 1 tablet 3 (three) times daily by mouth.     . montelukast (SINGULAIR) 10 MG tablet TAKE 1 TABLET BY MOUTH AT BEDTIME 90 tablet 0  . Multiple Vitamin (MULITIVITAMIN WITH MINERALS) TABS Take 1 tablet by mouth daily.      . nitroGLYCERIN (NITROSTAT) 0.3 MG SL tablet Place 0.3 mg under the tongue every 5 (five) minutes as needed for chest pain (as needed).    . NONFORMULARY OR COMPOUNDED ITEM Estradiol .02% 1 ML Prefilled Applicator Sig: apply vaginally twice a week #90 Day Supply with 4 refills 1 each 4  . NONFORMULARY OR COMPOUNDED ITEM [facility]:  Onychomycosis nail lacquer - fluconazole 2%, terbinafine 1%, DMSO apply to affected area daily. 120 each 2  . nystatin cream (MYCOSTATIN) Apply 1 application topically 2 (two) times daily. 30 g 0  .  ondansetron (ZOFRAN) 4 MG tablet Take 1 tablet (4 mg total) by mouth every 4 (four) hours as needed for nausea or vomiting. 30 tablet 11  . Oxycodone HCl 20 MG TABS Take 20 mg every 4 (four) hours as needed by mouth (pain).     . OXYGEN Inhale 2 L into the lungs at bedtime.    . potassium chloride (KLOR-CON 10) 10 MEQ tablet Take 2 tablets (20 mEq total) by mouth daily. 180 tablet 1  . PROAIR HFA 108 (90 Base) MCG/ACT inhaler INHALE 2 PUFFS INTO THE LUNGS EVERY 4 HOURS AS NEEDED FOR SHORTNESS OF BREATH 8.5 Inhaler 3  . progesterone (PROMETRIUM) 200 MG capsule TAKE 1 CAPSULE BY MOUTH EVERY DAY FOR FIRST 12 DAYS OF MONTH 36 capsule 2  . promethazine (PHENERGAN) 25 MG tablet Take 1 tablet (25 mg total) by mouth every 6 (six) hours as needed for nausea or vomiting. 30 tablet 11  . triamcinolone cream (KENALOG) 0.1 % APPLY 1 APPLICATION TOPICALLY 2 (TWO) TIMES DAILY. 80 g 1  .  triamcinolone cream (KENALOG) 0.1 % Apply 1 application topically 2 (two) times daily. 30 g 0   No current facility-administered medications on file prior to visit.      3.) Review of functional ability and level of safety:  Any difficulty hearing?  See scanned documentation  History of falling?  See scanned documentation  Any trouble with IADLs - using a phone, using transportation, grocery shopping, preparing meals, doing housework, doing laundry, taking medications and managing money?  See scanned documentation  Advance Directives?  Discussed briefly and offered more resources and detailed discussion with our trained staff.   See summary of recommendations in Patient Instructions below.  4.) Physical Exam There were no vitals filed for this visit. Estimated body mass index is 39.11 kg/m as calculated from the following:   Height as of 10/15/18: 5\' 5"  (1.651 m).   Weight as of 10/15/18: 235 lb (106.6 kg).  EKG (optional): deferred  General: alert, appear well hydrated and in no acute distress  HEENT:  visual acuity grossly intact  CV: HRRR  Lungs: CTA bilaterally  Psych: pleasant and cooperative, no obvious depression or anxiety  Cognitive function grossly intact  See patient instructions for recommendations.  Education and counseling regarding the above review of health provided with a plan for the following: -see scanned patient completed form for further details -fall prevention strategies discussed  -healthy lifestyle discussed -importance and resources for completing advanced directives discussed -see patient instructions below for any other recommendations provided  4)The following written screening schedule of preventive measures were reviewed with assessment and plan made per below, orders and patient instructions:      AAA screening done if applicable     Alcohol screening done     Obesity Screening and counseling done     STI screening (Hep C if born [birth year]) offered and per pt wishes     Tobacco Screening done done       Pneumococcal (PPSV23 -one dose after 64, one before if risk factors), influenza yearly and hepatitis B vaccines (if high risk - end stage renal disease, IV drugs, homosexual men, live in home for mentally retarded, hemophilia receiving factors) ASSESSMENT/PLAN: done if applicable      Screening mammograph (yearly if >40) ASSESSMENT/PLAN: utd with gyn      Screening Pap smear/pelvic exam (q2 years) ASSESSMENT/PLAN:done with gyn      Colorectal cancer screening (FOBT yearly or flex sig q4y or colonoscopy q10y or barium enema q4y) ASSESSMENT/PLAN: utd or ordered      Diabetes outpatient self-management training services ASSESSMENT/PLAN: utd or done      Bone mass measurements(covered q2y if indicated - estrogen def, osteoporosis, hyperparathyroid, vertebral abnormalities, osteoporosis or steroids) ASSESSMENT/PLAN: utd or discussed and ordered per pt wishes      Screening for glaucoma(q1y if high risk - diabetes, FH, AA and > 50 or hispanic and  > 65) ASSESSMENT/PLAN: utd or advised      Medical nutritional therapy for individuals with diabetes or renal disease ASSESSMENT/PLAN: see orders      Cardiovascular screening blood tests (lipids q5y) ASSESSMENT/PLAN: see orders and labs      Diabetes screening tests ASSESSMENT/PLAN: see orders and labs   7.) Summary: -risk factors and conditions per above assessment were discussed and treatment, recommendations and referrals were offered per documentation above and orders and patient instructions.  No diagnosis found.  There are no Patient Instructions on file for this visit.  Lucretia Kern, DO

## 2018-11-27 ENCOUNTER — Ambulatory Visit: Payer: Medicare Other | Admitting: Family Medicine

## 2018-12-01 ENCOUNTER — Ambulatory Visit: Payer: Medicare Other

## 2018-12-01 NOTE — Progress Notes (Signed)
Medicare Annual Preventive Care Visit  (initial annual wellness or annual wellness exam)  Concerns and/or follow up today: Due for labs. Last AWV with Manuela Schwartz 11/2017.  Sees gyn for women's health.  Sees dermatology for skin checks.  Shelby Anderson is a pleasant 61 y.o. here for follow up. Chronic medical problems summarized below were reviewed for changes and stability and were updated as needed below. These issues and their treatment remain stable for the most part. Reports doing well. Trying out CPAP with sleep specialist. Active taking care of grandchildren. Diet not great. Denies CP, SOB, DOE, treatment intolerance or new symptoms.  Diabetes Mellitus: -takes cinnamon, glipizide, metformin added 04/2018 -no regular exercise, but she is active taking care of grandchildren -hx CAD (sees cardiologist), HLD, HTN, orthostattic hypotension - on statin and asa, not on acei - has asthma  GERD/constipation: -hx bad reflux, esophageal stricture s/p dilation and gastric ulcer -sees Dr. Henrene Pastor for this  -on nexium 40mg  bid, carafate, prn phenergan, colace  CAD hx MI x2 per her report/Arrythmia/HTN/HLD/orthostatic hypotension/presyncope: -seeing several cardiologists -meds: asa,statin,metoprolol, fish oil, isosorbid, K+, ntg prn -denies: CP, SOB, palpitations -on florinef in the past  Asthma/Allergies: -sees pulmonologist for this -meds: singulair, symbicort, albuterol, flonase  Insomnia/OSA: -uses benydrl occ for this -eszopiclone on med list from ? [facility]-reports she very rarely takes this, may be a few times per year. -hx of low O2 at night -seeing sleep med, Dr. Ander Slade  Chronic back pain/Knee pain: -hx DDD and neuropathy, OA knees -seeing [place] pain management specialist and referred to ortho -on lyrica, deplin, robaxin -on methadone in the past and opana - reports reacted to them -aware of risks with medications, adamant about continuing mobic as feels is  only medication that helps  MDD: -reports on deplin from pain management for this, does not feel helps -declined other treatments offered here -Chronic symptoms: mildly constant depressed mood and hopeless feelings at times, no SI, panic or manic symptoms -reports stable  B12 def: -on high dose b12 - reports started in hospital  HRT/postmenopausl bleeding: -on prometrium with her gynecologist, Dr. Toney Rakes  See HM section in Epic for other details of completed HM. See scanned documentation under Media Tab for further documentation HPI, health risk assessment. See Media Tab and Care Teams sections in Epic for other providers.  ROS: negative for report of fevers, unintentional weight loss, vision changes, vision loss, hearing loss or change, chest pain, sob, hemoptysis, melena, hematochezia, hematuria, genital discharge or lesions, falls, bleeding or bruising, loc, thoughts of suicide or self harm, memory loss  1.) Patient-completed health risk assessment  - completed and reviewed, see scanned documentation  2.) Review of Medical History: -PMH, PSH, Family History and current specialty and care providers reviewed and updated and listed below  - see scanned in document in chart and below  Past Medical History:  Diagnosis Date  . Allergy   . Anemia    "chronic"  . Angina   . Anxiety and depression   . Arthritis   . Asthma   . Atrial fibrillation ([place])    h/o "AF w/frequent PVCs"  . Cancer (HCC)    hx of skin cancer   . CHF (congestive heart failure) ([place])   . Chronic back pain greater than 3 months duration    on chronic narcotics, treated at pain clinic  . Colon polyps    hyperplastic  . Coronary artery disease    Arrythmia, orthostatic hypotension, HLD, HTN; sees Dr. Einar Gip  .  Difficult intubation    "TMJ & woke up when they were still cutting on me"  . Dysrhythmia    sees Dr. Einar Gip and a cardiologist at [facility]  . Esophageal stricture   . Fatty liver   .  Fibroids   . Fibromyalgia    "in my legs"  . GERD (gastroesophageal reflux disease)    hx hiatal hernia, stricture and gastric ulcer  . Headache(784.0)   . Heart murmur   . Hiatal hernia   . History of loop recorder   . History of migraines    "dx'd when I was in my teens"  . Hyperlipemia   . Hypertension   . Mental disorder   . Mild episode of recurrent major depressive disorder ([place]) 12/06/2015  . Myocardial infarction [facility]) 1980's & 1990;   sees Dr. Einar Gip  . OSA (obstructive sleep apnea)   . Pneumonia   . PONV (postoperative nausea and vomiting)   . Recurrent upper respiratory infection (URI)   . Shortness of breath 11/20/11   "all the time", sees pulmonlogy, ? asthma  . Stenotic cervical os   . Stomach ulcer    "3 small; found in 05/2011"  . TMJ (dislocation of temporomandibular joint)   . Tuberculosis    + TB SKIN TEST  . Type 2 diabetes mellitus without complication, without long-term current use of insulin ([place]) 12/06/2015   not on meds     Past Surgical History:  Procedure Laterality Date  . ACHILLES TENDON REPAIR  1970's   left ankle  . ARTHROSCOPIC REPAIR ACL     left knee cap  . BREAST BIOPSY Right 04/08/2013  . CARDIAC CATHETERIZATION     loop recorder  . CARPAL TUNNEL RELEASE  unknown   left hand  . ESOPHAGOGASTRODUODENOSCOPY (EGD) WITH PROPOFOL N/A 10/29/2017   Procedure: ESOPHAGOGASTRODUODENOSCOPY (EGD) WITH PROPOFOL;  Surgeon: Irene Shipper, MD;  Location: [facility] END[facility];  Service: Endoscopy;  Laterality: N/A;  . LOOP RECORDER IMPLANT    . post ganglionectomy  1970's   "for migraine headaches"  . pouch string  [phone]   "did this 3 times (once w/each pregnancy)"  . SAVORY DILATION N/A 10/29/2017   Procedure: SAVORY DILATION;  Surgeon: Irene Shipper, MD;  Location: [facility];  Service: Endoscopy;  Laterality: N/A;  . TOTAL KNEE ARTHROPLASTY Left 09/25/2016   Procedure: LEFT TOTAL KNEE ARTHROPLASTY;  Surgeon: Paralee Cancel, MD;  Location: [facility] OR[facility];   Service: Orthopedics;  Laterality: Left;  . TUBAL LIGATION  1980's    Social History   Socioeconomic History  . Marital status: Married    Spouse name: Not on file  . Number of children: 3  . Years of education: Not on file  . Highest education level: Not on file  Occupational History  . Occupation: Retired Animal nutritionist  . Financial resource strain: Not on file  . Food insecurity:    Worry: Not on file    Inability: Not on file  . Transportation needs:    Medical: Not on file    Non-medical: Not on file  Tobacco Use  . Smoking status: Never Smoker  . Smokeless tobacco: Never Used  Substance and Sexual Activity  . Alcohol use: No    Alcohol/week: 0.0 standard drinks  . Drug use: No  . Sexual activity: Not Currently    Birth control/protection: Surgical    Comment: 1st intercourse 62 yo-Fewer than 5 partners  Lifestyle  . Physical activity:    Days  per week: Not on file    Minutes per session: Not on file  . Stress: Not on file  Relationships  . Social connections:    Talks on phone: Not on file    Gets together: Not on file    Attends religious service: Not on file    Active member of club or organization: Not on file    Attends meetings of clubs or organizations: Not on file    Relationship status: Not on file  . Intimate partner violence:    Fear of current or ex partner: Not on file    Emotionally abused: Not on file    Physically abused: Not on file    Forced sexual activity: Not on file  Other Topics Concern  . Not on file  Social History Narrative  . Not on file    Family History  Problem Relation Age of Onset  . Malignant hyperthermia Father   . Hypertension Father   . Heart disease Father   . Diabetes Father   . Cancer Father        skin  . Hypertension Mother   . Heart disease Mother   . Cancer Sister        CERVICAL  . Hypertension Sister   . Cancer Brother        MELANOMA  . Heart disease Maternal Grandmother   . Heart disease  Maternal Grandfather   . Cancer Paternal Grandmother        ?   Marland Kitchen Heart disease Paternal Grandmother   . Heart disease Paternal Grandfather   . Cancer Brother        LUNG  . Diabetes Sister   . Hypertension Sister   . Heart disease Sister   . Cancer Sister   . Cancer Brother   . Anesthesia problems Neg Hx   . Hypotension Neg Hx   . Pseudochol deficiency Neg Hx   . Colon cancer Neg Hx   . Esophageal cancer Neg Hx   . Pancreatic cancer Neg Hx   . Rectal cancer Neg Hx   . Stomach cancer Neg Hx   . Breast cancer Neg Hx     Current Outpatient Medications on File Prior to Visit  Medication Sig Dispense Refill  . albuterol (PROVENTIL) (2.5 MG/3ML) 0.083% nebulizer solution Take 3 mLs (2.5 mg total) by nebulization every 6 (six) hours as needed for wheezing or shortness of breath (J45.40). 75 mL 3  . aspirin EC 81 MG tablet Take 81 mg at bedtime by mouth.     Marland Kitchen atorvastatin (LIPITOR) 40 MG tablet TAKE 1 TABLET BY MOUTH EVERY DAY 90 tablet 1  . budesonide-formoterol (SYMBICORT) 80-4.5 MCG/ACT inhaler INHALE 2 PUFFS INTO THE LUNGS FIRST THING IN THE AM AND THEN ANOTHER 2 PUFFS 12 HOURS LATER 30.6 Inhaler 1  . CARAFATE 1 GM/10ML suspension TAKE 10 MILLILITERS BY MOUTH 4 TIMES DAILY WITH MEALS AND AT BEDTIME 420 mL 2  . CINNAMON PO Take 1,000 mg 2 (two) times daily by mouth.    . Cyanocobalamin (VITAMIN B-12) 5000 MCG TBDP Take 5,000 mcg by mouth daily.     Marland Kitchen esomeprazole (NEXIUM) 40 MG capsule Take 1 capsule (40 mg total) by mouth 2 (two) times daily. 60 capsule 11  . fluticasone (FLONASE) 50 MCG/ACT nasal spray Place 2 sprays at bedtime as needed into both nostrils for allergies or rhinitis.     Marland Kitchen glipiZIDE (GLUCOTROL XL) 5 MG 24 hr tablet Take 1 tablet (5 mg  total) by mouth daily with breakfast. 90 tablet 3  . hydrocortisone 2.5 % cream APPLY TO AFFECTED AREA TWICE A DAY. FOR DERMATITIS ON EARS FOR 2 WEEKS  3  . isosorbide mononitrate (IMDUR) 60 MG 24 hr tablet TAKE 1 TABLET BY MOUTH EVERY  DAY 90 tablet 1  . L-Methylfolate (DEPLIN) 7.5 MG TABS Take 7.5 mg by mouth daily with breakfast.     . LYRICA 150 MG capsule TAKE 1 CAPSULE BY MOUTH EVERY 8 HOURS AS DIRECTED  2  . metFORMIN (GLUCOPHAGE) 500 MG tablet TAKE 1 TABLET BY MOUTH EVERY DAY WITH BREAKFAST 90 tablet 1  . methocarbamol (ROBAXIN) 500 MG tablet Take 1 tablet (500 mg total) by mouth every 6 (six) hours as needed for muscle spasms. (Patient taking differently: Take 500 mg 3 (three) times daily as needed by mouth for muscle spasms. ) 60 tablet 0  . metoprolol tartrate (LOPRESSOR) 50 MG tablet TAKE 1 TABLET TWICE A DAY 180 tablet 1  . Misc Natural Products (GLUCOS-CHONDROIT-MSM COMPLEX PO) Take 1 tablet 3 (three) times daily by mouth.     . montelukast (SINGULAIR) 10 MG tablet TAKE 1 TABLET BY MOUTH AT BEDTIME 90 tablet 0  . Multiple Vitamin (MULITIVITAMIN WITH MINERALS) TABS Take 1 tablet by mouth daily.      . nitroGLYCERIN (NITROSTAT) 0.3 MG SL tablet Place 0.3 mg under the tongue every 5 (five) minutes as needed for chest pain (as needed).    . NONFORMULARY OR COMPOUNDED ITEM Estradiol .02% 1 ML Prefilled Applicator Sig: apply vaginally twice a week #90 Day Supply with 4 refills 1 each 4  . NONFORMULARY OR COMPOUNDED ITEM [facility]:  Onychomycosis nail lacquer - fluconazole 2%, terbinafine 1%, DMSO apply to affected area daily. 120 each 2  . nystatin cream (MYCOSTATIN) Apply 1 application topically 2 (two) times daily. 30 g 0  . ondansetron (ZOFRAN) 4 MG tablet Take 1 tablet (4 mg total) by mouth every 4 (four) hours as needed for nausea or vomiting. 30 tablet 11  . Oxycodone HCl 20 MG TABS Take 20 mg every 4 (four) hours as needed by mouth (pain).     . OXYGEN Inhale 2 L into the lungs at bedtime.    . potassium chloride (KLOR-CON 10) 10 MEQ tablet Take 2 tablets (20 mEq total) by mouth daily. 180 tablet 1  . PROAIR HFA 108 (90 Base) MCG/ACT inhaler INHALE 2 PUFFS INTO THE LUNGS EVERY 4 HOURS AS NEEDED FOR  SHORTNESS OF BREATH 8.5 Inhaler 3  . progesterone (PROMETRIUM) 200 MG capsule TAKE 1 CAPSULE BY MOUTH EVERY DAY FOR FIRST 12 DAYS OF MONTH 36 capsule 2  . promethazine (PHENERGAN) 25 MG tablet Take 1 tablet (25 mg total) by mouth every 6 (six) hours as needed for nausea or vomiting. 30 tablet 11   No current facility-administered medications on file prior to visit.      3.) Review of functional ability and level of safety:  Any difficulty hearing?  See scanned documentation  History of falling?  See scanned documentation  Any trouble with IADLs - using a phone, using transportation, grocery shopping, preparing meals, doing housework, doing laundry, taking medications and managing money?  See scanned documentation  Advance Directives?  Discussed briefly and offered more resources and detailed discussion with our trained staff.   See summary of recommendations in Patient Instructions below.  4.) Physical Exam Vitals:   12/02/18 0920  BP: 116/80  Pulse: (!) 56  Temp: 98.3 F (36.8  C)   Estimated body mass index is 38.76 kg/m as calculated from the following:   Height as of this encounter: 5' 5.5" (1.664 m).   Weight as of this encounter: 236 lb 8 oz (107.3 kg).  EKG (optional): deferred  General: alert, appear well hydrated and in no acute distress  HEENT: visual acuity grossly intact  CV: HRRR  Lungs: CTA bilaterally  Psych: pleasant and cooperative, no obvious depression or anxiety  Cognitive function grossly intact  See patient instructions for recommendations.  Education and counseling regarding the above review of health provided with a plan for the following: -see scanned patient completed form for further details -fall prevention strategies discussed  -healthy lifestyle discussed -importance and resources for completing advanced directives discussed -see patient instructions below for any other recommendations provided  4)The following written screening  schedule of preventive measures were reviewed with assessment and plan made per below, orders and patient instructions:      AAA screening done if applicable     Alcohol screening done     Obesity Screening and counseling done     STI screening (Hep C if born 1943-65) offered and per pt wishes     Tobacco Screening done       Pneumococcal (PPSV23 -one dose after 64, one before if risk factors), influenza yearly and hepatitis B vaccines (if high risk - end stage renal disease, IV drugs, homosexual men, live in home for mentally retarded, hemophilia receiving factors) ASSESSMENT/PLAN: done if applicable      Screening mammograph (yearly if >40) ASSESSMENT/PLAN: utd, done at breast center 08/2018      Screening Pap smear/pelvic exam (q2 years) ASSESSMENT/PLAN: n/a, declined - seeing gyn      Colorectal cancer screening (FOBT yearly or flex sig q4y or colonoscopy q10y or barium enema q4y) ASSESSMENT/PLAN: utd, colonosocpy done 2012 with [facility]      Diabetes outpatient self-management training services ASSESSMENT/PLAN: utd or done      Bone mass measurements(covered q2y if indicated - estrogen def, osteoporosis, hyperparathyroid, vertebral abnormalities, osteoporosis or steroids) ASSESSMENT/PLAN: utd or discussed and ordered per pt wishes      Screening for glaucoma(q1y if high risk - diabetes, FH, AA and > 50 or hispanic and > 65) ASSESSMENT/PLAN: utd or advised      Medical nutritional therapy for individuals with diabetes or renal disease ASSESSMENT/PLAN: see orders      Cardiovascular screening blood tests (lipids q5y) ASSESSMENT/PLAN: see orders and labs      Diabetes screening tests ASSESSMENT/PLAN: see orders and labs   7.) Summary:  Medicare annual wellness visit, subsequent -risk factors and conditions per above assessment were discussed and treatment, recommendations and referrals were offered per documentation above and orders and patient instructions. -will  consider shinrix, she may get at pharmacy - wants to check insurance  Hypertension associated with diabetes ([place]) - Plan: Basic metabolic panel, CBC  Hyperlipidemia associated with type 2 diabetes mellitus ([place])  Chronic respiratory failure with hypoxia ([place])  Type 2 diabetes mellitus with other circulatory complication, without long-term current use of insulin ([place]) - Plan: Hemoglobin A1c  Episode of recurrent major depressive disorder, unspecified depression episode severity ([place])  B12 deficiency - Plan: Vitamin B12  Labs per orders Lifestyle recs Cont current meds Advised stopping calcium Adjust b12 after checking  Patient Instructions   BEFORE YOU LEAVE: -labs -follow up: 3-4 months   Shelby Anderson , Thank you for taking time to come for your Medicare Wellness Visit.  I appreciate your ongoing commitment to your health goals. Please review the following plan we discussed and let me know if I can assist you in the future.   These are the goals we discussed: Goals      General   . Labs  Healthy low sugar diet  Regular exercise  Consider Shinrix vaccine           This is a list of the screening recommended for you and due dates:  Health Maintenance  Topic Date Due  . Hemoglobin A1C  01/24/2019  . Complete foot exam   04/22/2019  . Eye exam for diabetics  07/23/2019  . Urine Protein Check  07/25/2019  . Mammogram  08/11/2020  . Colon Cancer Screening  12/29/2020  . Pap Smear  08/21/2021  . Tetanus Vaccine  06/28/2024  . Flu Shot  Completed  . Pneumococcal vaccine  Completed  .  Hepatitis C: One time screening is recommended by Center for Disease Control  (CDC) for  adults born from [birth year] through 1963.   Completed  . HIV Screening  Completed      Lucretia Kern, DO

## 2018-12-02 ENCOUNTER — Ambulatory Visit (INDEPENDENT_AMBULATORY_CARE_PROVIDER_SITE_OTHER): Payer: Medicare Other | Admitting: Family Medicine

## 2018-12-02 ENCOUNTER — Encounter: Payer: Self-pay | Admitting: Family Medicine

## 2018-12-02 VITALS — BP 116/80 | HR 56 | Temp 98.3°F | Ht 65.5 in | Wt 236.5 lb

## 2018-12-02 DIAGNOSIS — E119 Type 2 diabetes mellitus without complications: Secondary | ICD-10-CM

## 2018-12-02 DIAGNOSIS — F339 Major depressive disorder, recurrent, unspecified: Secondary | ICD-10-CM

## 2018-12-02 DIAGNOSIS — E1169 Type 2 diabetes mellitus with other specified complication: Secondary | ICD-10-CM

## 2018-12-02 DIAGNOSIS — Z0000 Encounter for general adult medical examination without abnormal findings: Secondary | ICD-10-CM | POA: Diagnosis not present

## 2018-12-02 DIAGNOSIS — I10 Essential (primary) hypertension: Secondary | ICD-10-CM

## 2018-12-02 DIAGNOSIS — J9611 Chronic respiratory failure with hypoxia: Secondary | ICD-10-CM | POA: Diagnosis not present

## 2018-12-02 DIAGNOSIS — E538 Deficiency of other specified B group vitamins: Secondary | ICD-10-CM | POA: Diagnosis not present

## 2018-12-02 DIAGNOSIS — E1159 Type 2 diabetes mellitus with other circulatory complications: Secondary | ICD-10-CM

## 2018-12-02 DIAGNOSIS — E785 Hyperlipidemia, unspecified: Secondary | ICD-10-CM

## 2018-12-02 LAB — BASIC METABOLIC PANEL
BUN: 10 mg/dL (ref 6–23)
CHLORIDE: 103 meq/L (ref 96–112)
CO2: 33 mEq/L — ABNORMAL HIGH (ref 19–32)
CREATININE: 0.63 mg/dL (ref 0.40–1.20)
Calcium: 9.1 mg/dL (ref 8.4–10.5)
GFR: 102.2 mL/min (ref >60.00)
Glucose, Bld: 87 mg/dL (ref 70–99)
POTASSIUM: 3.9 meq/L (ref 3.5–5.1)
Sodium: 141 mEq/L (ref 135–145)

## 2018-12-02 LAB — CBC
HEMATOCRIT: 39 % (ref 36.0–46.0)
HEMOGLOBIN: 12.8 g/dL (ref 12.0–15.0)
MCHC: 32.8 g/dL (ref 30.0–36.0)
MCV: 87.2 fl (ref 78.0–100.0)
Platelets: 239 10*3/uL (ref 150.0–400.0)
RBC: 4.47 Mil/uL (ref 3.87–5.11)
RDW: 14.7 % (ref 11.5–15.5)
WBC: 6 10*3/uL (ref 4.0–10.5)

## 2018-12-02 LAB — VITAMIN B12: Vitamin B-12: >1500 pg/mL — ABNORMAL HIGH (ref 211–911)

## 2018-12-02 LAB — HEMOGLOBIN A1C: HEMOGLOBIN A1C: 6.5 % (ref 4.6–6.5)

## 2018-12-02 NOTE — Patient Instructions (Signed)
BEFORE YOU LEAVE: -labs -follow up: 3-4 months   Shelby Anderson , Thank you for taking time to come for your Medicare Wellness Visit. I appreciate your ongoing commitment to your health goals. Please review the following plan we discussed and let me know if I can assist you in the future.   These are the goals we discussed: Goals      General   . Labs  Healthy low sugar diet  Regular exercise  Consider Shinrix vaccine           This is a list of the screening recommended for you and due dates:  Health Maintenance  Topic Date Due  . Hemoglobin A1C  01/24/2019  . Complete foot exam   04/22/2019  . Eye exam for diabetics  07/23/2019  . Urine Protein Check  07/25/2019  . Mammogram  08/11/2020  . Colon Cancer Screening  12/29/2020  . Pap Smear  08/21/2021  . Tetanus Vaccine  06/28/2024  . Flu Shot  Completed  . Pneumococcal vaccine  Completed  .  Hepatitis C: One time screening is recommended by Center for Disease Control  (CDC) for  adults born from [birth year] through 1963.   Completed  . HIV Screening  Completed

## 2018-12-04 ENCOUNTER — Ambulatory Visit: Payer: Medicare Other

## 2018-12-04 NOTE — Addendum Note (Signed)
Addended by: Agnes Lawrence on: 12/04/2018 05:09 PM   Modules accepted: Orders

## 2018-12-12 DIAGNOSIS — G4733 Obstructive sleep apnea (adult) (pediatric): Secondary | ICD-10-CM | POA: Diagnosis not present

## 2018-12-12 DIAGNOSIS — J4540 Moderate persistent asthma, uncomplicated: Secondary | ICD-10-CM | POA: Diagnosis not present

## 2018-12-14 ENCOUNTER — Other Ambulatory Visit: Payer: Self-pay | Admitting: Primary Care

## 2018-12-14 ENCOUNTER — Encounter: Payer: Self-pay | Admitting: Podiatry

## 2018-12-14 DIAGNOSIS — J45991 Cough variant asthma: Secondary | ICD-10-CM

## 2018-12-14 NOTE — Progress Notes (Signed)
Subjective: Patient presents today with diabetes, PAD and cc of painful, discolored, thick toenails which interfere with daily activities. Pain is aggravated when wearing enclosed shoe gear. Pain is getting progressively worse and relieved with periodic professional debridement.  She voices no new problems on today's visit.  Lucretia Kern, DO is her PCP and last DOS was 07/24/2018.   Current Outpatient Medications:  .  albuterol (PROVENTIL) (2.5 MG/3ML) 0.083% nebulizer solution, Take 3 mLs (2.5 mg total) by nebulization every 6 (six) hours as needed for wheezing or shortness of breath (J45.40)., Disp: 75 mL, Rfl: 3 .  aspirin EC 81 MG tablet, Take 81 mg at bedtime by mouth. , Disp: , Rfl:  .  atorvastatin (LIPITOR) 40 MG tablet, TAKE 1 TABLET BY MOUTH EVERY DAY, Disp: 90 tablet, Rfl: 1 .  budesonide-formoterol (SYMBICORT) 80-4.5 MCG/ACT inhaler, INHALE 2 PUFFS INTO THE LUNGS FIRST THING IN THE AM AND THEN ANOTHER 2 PUFFS 12 HOURS LATER, Disp: 30.6 Inhaler, Rfl: 1 .  CARAFATE 1 GM/10ML suspension, TAKE 10 MILLILITERS BY MOUTH 4 TIMES DAILY WITH MEALS AND AT BEDTIME, Disp: 420 mL, Rfl: 2 .  CINNAMON PO, Take 1,000 mg 2 (two) times daily by mouth., Disp: , Rfl:  .  Cyanocobalamin (VITAMIN B-12) 5000 MCG TBDP, Take 5,000 mcg by mouth daily. , Disp: , Rfl:  .  esomeprazole (NEXIUM) 40 MG capsule, Take 1 capsule (40 mg total) by mouth 2 (two) times daily., Disp: 60 capsule, Rfl: 11 .  fluticasone (FLONASE) 50 MCG/ACT nasal spray, Place 2 sprays at bedtime as needed into both nostrils for allergies or rhinitis. , Disp: , Rfl:  .  glipiZIDE (GLUCOTROL XL) 5 MG 24 hr tablet, Take 1 tablet (5 mg total) by mouth daily with breakfast., Disp: 90 tablet, Rfl: 3 .  hydrocortisone 2.5 % cream, APPLY TO AFFECTED AREA TWICE A DAY. FOR DERMATITIS ON EARS FOR 2 WEEKS, Disp: , Rfl: 3 .  isosorbide mononitrate (IMDUR) 60 MG 24 hr tablet, TAKE 1 TABLET BY MOUTH EVERY DAY, Disp: 90 tablet, Rfl: 1 .  L-Methylfolate  (DEPLIN) 7.5 MG TABS, Take 7.5 mg by mouth daily with breakfast. , Disp: , Rfl:  .  LYRICA 150 MG capsule, TAKE 1 CAPSULE BY MOUTH EVERY 8 HOURS AS DIRECTED, Disp: , Rfl: 2 .  metFORMIN (GLUCOPHAGE) 500 MG tablet, TAKE 1 TABLET BY MOUTH EVERY DAY WITH BREAKFAST, Disp: 90 tablet, Rfl: 1 .  methocarbamol (ROBAXIN) 500 MG tablet, Take 1 tablet (500 mg total) by mouth every 6 (six) hours as needed for muscle spasms. (Patient taking differently: Take 500 mg 3 (three) times daily as needed by mouth for muscle spasms. ), Disp: 60 tablet, Rfl: 0 .  metoprolol tartrate (LOPRESSOR) 50 MG tablet, TAKE 1 TABLET TWICE A DAY, Disp: 180 tablet, Rfl: 1 .  Misc Natural Products (GLUCOS-CHONDROIT-MSM COMPLEX PO), Take 1 tablet 3 (three) times daily by mouth. , Disp: , Rfl:  .  montelukast (SINGULAIR) 10 MG tablet, TAKE 1 TABLET BY MOUTH AT BEDTIME, Disp: 90 tablet, Rfl: 0 .  Multiple Vitamin (MULITIVITAMIN WITH MINERALS) TABS, Take 1 tablet by mouth daily.  , Disp: , Rfl:  .  nitroGLYCERIN (NITROSTAT) 0.3 MG SL tablet, Place 0.3 mg under the tongue every 5 (five) minutes as needed for chest pain (as needed)., Disp: , Rfl:  .  NONFORMULARY OR COMPOUNDED ITEM, Estradiol .02% 1 ML Prefilled Applicator Sig: apply vaginally twice a week #90 Day Supply with 4 refills, Disp: 1 each, Rfl: 4 .  NONFORMULARY OR COMPOUNDED ITEM, [facility]:  Onychomycosis nail lacquer - fluconazole 2%, terbinafine 1%, DMSO apply to affected area daily., Disp: 120 each, Rfl: 2 .  nystatin cream (MYCOSTATIN), Apply 1 application topically 2 (two) times daily., Disp: 30 g, Rfl: 0 .  ondansetron (ZOFRAN) 4 MG tablet, Take 1 tablet (4 mg total) by mouth every 4 (four) hours as needed for nausea or vomiting., Disp: 30 tablet, Rfl: 11 .  Oxycodone HCl 20 MG TABS, Take 20 mg every 4 (four) hours as needed by mouth (pain). , Disp: , Rfl:  .  OXYGEN, Inhale 2 L into the lungs at bedtime., Disp: , Rfl:  .  potassium chloride (KLOR-CON 10) 10 MEQ  tablet, Take 2 tablets (20 mEq total) by mouth daily., Disp: 180 tablet, Rfl: 1 .  PROAIR HFA 108 (90 Base) MCG/ACT inhaler, INHALE 2 PUFFS INTO THE LUNGS EVERY 4 HOURS AS NEEDED FOR SHORTNESS OF BREATH, Disp: 8.5 Inhaler, Rfl: 3 .  progesterone (PROMETRIUM) 200 MG capsule, TAKE 1 CAPSULE BY MOUTH EVERY DAY FOR FIRST 12 DAYS OF MONTH, Disp: 36 capsule, Rfl: 2 .  promethazine (PHENERGAN) 25 MG tablet, Take 1 tablet (25 mg total) by mouth every 6 (six) hours as needed for nausea or vomiting., Disp: 30 tablet, Rfl: 11   Allergies  Allergen Reactions  . Lodine [Etodolac] Anaphylaxis, Hives and Swelling  . Oxycontin [Oxycodone Hcl] Anaphylaxis    hives, trouble breathing, tongue swelling (Only Oxycontin) Tolerates plain oxycodone.  Marland Kitchen Penicillins Anaphylaxis    Told by a surgeon never to take it again. Has patient had a PCN reaction causing immediate rash, facial/tongue/throat swelling, SOB or lightheadedness with hypotension: Yes Has patient had a PCN reaction causing severe rash involving mucus membranes or skin necrosis: Unknown Has patient had a PCN reaction that required hospitalization: No Has patient had a PCN reaction occurring within the last 10 years: No If all of the above answers are "NO", then may proceed with Cephalosporin use.  . Aspirin Other (See Comments)    High-dose caused GI Bleeds  . Darvocet [Propoxyphene N-Acetaminophen] Hives  . Nitroglycerin     IV-BP drops dramatically  . Tramadol Other (See Comments)    UNKNOWN  . Ultram [Tramadol Hcl] Hives  . Valium Other (See Comments)    Circulation problems. "Legs turned black".     Objective:  Vascular Examination: Capillary refill time immediate x 10 digits Dorsalis pedis pulses  1/4 b/l Posterior tibial pulses absent b/l No digital hair x 10 digits Skin temperature gradient WNL b/l  Dermatological Examination: Skin with normal turgor, texture and tone b/l  Toenails 1-5 b/l discolored, thick, dystrophic with  subungual debris and pain with palpation to nailbeds due to thickness of nails.  No open wounds  No interdigital macerations  Musculoskeletal: Muscle strength 5/5 to all LE muscle groups  Normal joint ROM without pain/crepitus b/l  Neurological: Sensation intact with 10 gram monofilament. Vibratory sensation intact.  Assessment: 1. Painful onychomycosis toenails 1-5 b/l 2. NIDDM with Peripheral arterial disease  Plan: 1. Toenails 1-5 b/l were debrided in length and girth without iatrogenic bleeding. 2. Patient to continue soft, supportive shoe gear 3. Patient to report any pedal injuries to medical professional  4. Follow up 3 months. Patient/POA to call should there be a concern in the interim.

## 2018-12-15 ENCOUNTER — Encounter: Payer: Self-pay | Admitting: Pulmonary Disease

## 2018-12-15 ENCOUNTER — Ambulatory Visit: Payer: Medicare Other | Admitting: Pulmonary Disease

## 2018-12-15 DIAGNOSIS — J9611 Chronic respiratory failure with hypoxia: Secondary | ICD-10-CM | POA: Diagnosis not present

## 2018-12-15 DIAGNOSIS — G4733 Obstructive sleep apnea (adult) (pediatric): Secondary | ICD-10-CM | POA: Diagnosis not present

## 2018-12-15 DIAGNOSIS — J45991 Cough variant asthma: Secondary | ICD-10-CM | POA: Diagnosis not present

## 2018-12-15 NOTE — Assessment & Plan Note (Addendum)
CPAP has helped improve her daytime somnolence and fatigue.  She feels better rested and her reflux sometimes are improved.  She is compliant  Turn up the  humidity on your machine Check ONO on CPAP/room air, she may not need oxygen  Weight loss encouraged, compliance with goal of at least 4-6 hrs every night is the expectation. Advised against medications with sedative side effects Cautioned against driving when sleepy - understanding that sleepiness will vary on a day to day basis

## 2018-12-15 NOTE — Progress Notes (Signed)
Subjective:    Patient ID: Shelby Anderson, female    DOB: 06-11-1956, 63 y.o.   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  PMH -syncope &has a loop recorder - brady &int a - fibn ,pulm hypertension She has positive PPD.   Chief Complaint  Patient presents with  . Follow-up    2 month after CPAP. Patient also does not feel well. Increased SOB, body aches and feels lightheaded.    She was started on CPAP last visit, with a full facemask.  We reviewed her sleep studies.  She feels better rested, no daytime naps.  She has settled down with a full facemask, complains of dryness, pressure is okay. Download was reviewed which shows good control of events on 10 cm, good compliance and minimal leak  Her husband has been diagnosed with a lung mass and biopsy is plan he is a smoker.  She is nervous about this.  She also reports feeling cold and hot, her husband was given antibiotics recently, she reports a dry cough wonders if she is coming down with something   Significant tests/ events reviewed  Spirometry 12/2011 - no airway obstruction -No reversibility with bronchodilator , mod restriction Spirometry8/2019FVC 1.64 (46%), FEV1 1.34 (49%), ratio 82 (104%)   07/2018 FENO 11  Home sleep test 07/2012 - showed desatn,no OSA  11/2015 ONO positive , Started on oxygen 2l/m .  10/2016 CT angio neg PE  CT sinus 01/2017 >> clear  12/2013 -admitted asthma exacerbation,CAP, RSV bronchitis.  EGD 03/2011 ([facility]) - peptic ulcer disease.  12/2013 barium esophagram - distal esophageal stricture,stricture dilated again [id number] ([place]) 01/2017 Swallow >>Persistent/recurrent stricture at the GE junction  HST 08/2018 AHI 9/h 09/2018 >> CPAP 10 cm, med FF mask   Past Medical History:  Diagnosis Date  . Allergy   . Anemia    "chronic"  . Angina   . Anxiety and depression   . Arthritis   . Asthma   . Atrial fibrillation ([place])    h/o "AF w/frequent PVCs"  . Cancer (HCC)    hx of skin cancer   . CHF (congestive heart failure) ([place])   . Chronic back pain greater than 3 months duration    on chronic narcotics, treated at pain clinic  . Colon polyps    hyperplastic  . Coronary artery disease    Arrythmia, orthostatic hypotension, HLD, HTN; sees Dr. Einar Gip  . Difficult intubation    "TMJ & woke up when they were still cutting on me"  . Dysrhythmia    sees Dr. Einar Gip and a cardiologist at [facility]  . Esophageal stricture   . Fatty liver   . Fibroids   . Fibromyalgia    "in my legs"  . GERD (gastroesophageal reflux disease)    hx hiatal hernia, stricture and gastric ulcer  . Headache(784.0)   . Heart murmur   . Hiatal hernia   . History of loop recorder   . History of migraines    "dx'd when I was in my teens"  . Hyperlipemia   . Hypertension   . Mental disorder   . Mild episode of recurrent major depressive disorder ([place]) 12/06/2015  . Myocardial infarction [name]) 1980's & 1990;   sees Dr. Einar Gip  . OSA (obstructive sleep apnea)   . Pneumonia   . PONV (postoperative nausea and  vomiting)   . Recurrent upper respiratory infection (URI)   . Shortness of breath 11/20/11   "all the time", sees pulmonlogy, ? asthma  . Stenotic cervical os   . Stomach ulcer    "3 small; found in 05/2011"  . TMJ (dislocation of temporomandibular joint)   . Tuberculosis    + TB SKIN TEST  . Type 2 diabetes mellitus without complication, without long-term current use of insulin ([place]) 12/06/2015   not on meds      Review of Systems neg for any significant sore throat, dysphagia, itching, sneezing, nasal congestion or excess/ purulent secretions, fever, chills, sweats, unintended wt loss, pleuritic or exertional cp, hempoptysis, orthopnea pnd or change in chronic leg swelling. Also denies presyncope, palpitations,  heartburn, abdominal pain, nausea, vomiting, diarrhea or change in bowel or urinary habits, dysuria,hematuria, rash, arthralgias, visual complaints, headache, numbness weakness or ataxia.     Objective:   Physical Exam   Gen. Pleasant, obese, in no distress ENT - no lesions, no post nasal drip Neck: No JVD, no thyromegaly, no carotid bruits Lungs: no use of accessory muscles, no dullness to percussion, decreased without rales or rhonchi  Cardiovascular: Rhythm regular, heart sounds  normal, no murmurs or gallops, no peripheral edema Musculoskeletal: No deformities, no cyanosis or clubbing , no tremors        Assessment & Plan:

## 2018-12-15 NOTE — Assessment & Plan Note (Signed)
Check nocturnal oximetry on CPAP/room air She may not need oxygen anymore

## 2018-12-15 NOTE — Assessment & Plan Note (Signed)
Continue Symbicort, rinse mouth after use. She will be given a prescription for doxycycline to use in case she develops yellow-green sputum.  She may have a viral syndrome brewing

## 2018-12-15 NOTE — Patient Instructions (Signed)
Prescription for doxycycline 100 mg daily for 7 days.  CPAP is working well, expectation is that you use this at least 6 hours every night.  Turn up the  humidity on your machine

## 2018-12-17 ENCOUNTER — Other Ambulatory Visit: Payer: Self-pay | Admitting: Family Medicine

## 2018-12-27 ENCOUNTER — Other Ambulatory Visit: Payer: Self-pay | Admitting: Adult Health

## 2018-12-29 DIAGNOSIS — M47816 Spondylosis without myelopathy or radiculopathy, lumbar region: Secondary | ICD-10-CM | POA: Diagnosis not present

## 2018-12-29 DIAGNOSIS — S9422XS Injury of deep peroneal nerve at ankle and foot level, left leg, sequela: Secondary | ICD-10-CM | POA: Diagnosis not present

## 2018-12-29 DIAGNOSIS — G894 Chronic pain syndrome: Secondary | ICD-10-CM | POA: Diagnosis not present

## 2018-12-29 DIAGNOSIS — Z79891 Long term (current) use of opiate analgesic: Secondary | ICD-10-CM | POA: Diagnosis not present

## 2019-01-12 DIAGNOSIS — G4733 Obstructive sleep apnea (adult) (pediatric): Secondary | ICD-10-CM | POA: Diagnosis not present

## 2019-01-12 DIAGNOSIS — J4540 Moderate persistent asthma, uncomplicated: Secondary | ICD-10-CM | POA: Diagnosis not present

## 2019-01-14 ENCOUNTER — Other Ambulatory Visit: Payer: Self-pay | Admitting: Family Medicine

## 2019-01-27 DIAGNOSIS — M47816 Spondylosis without myelopathy or radiculopathy, lumbar region: Secondary | ICD-10-CM | POA: Diagnosis not present

## 2019-01-27 DIAGNOSIS — S9422XS Injury of deep peroneal nerve at ankle and foot level, left leg, sequela: Secondary | ICD-10-CM | POA: Diagnosis not present

## 2019-01-27 DIAGNOSIS — G894 Chronic pain syndrome: Secondary | ICD-10-CM | POA: Diagnosis not present

## 2019-01-27 DIAGNOSIS — Z79891 Long term (current) use of opiate analgesic: Secondary | ICD-10-CM | POA: Diagnosis not present

## 2019-02-10 DIAGNOSIS — G4733 Obstructive sleep apnea (adult) (pediatric): Secondary | ICD-10-CM | POA: Diagnosis not present

## 2019-02-10 DIAGNOSIS — J189 Pneumonia, unspecified organism: Secondary | ICD-10-CM | POA: Diagnosis not present

## 2019-02-10 DIAGNOSIS — J45909 Unspecified asthma, uncomplicated: Secondary | ICD-10-CM | POA: Diagnosis not present

## 2019-02-10 DIAGNOSIS — R0902 Hypoxemia: Secondary | ICD-10-CM | POA: Diagnosis not present

## 2019-02-12 ENCOUNTER — Other Ambulatory Visit: Payer: Self-pay

## 2019-02-12 ENCOUNTER — Ambulatory Visit: Payer: Medicare Other | Admitting: Podiatry

## 2019-02-12 DIAGNOSIS — E1151 Type 2 diabetes mellitus with diabetic peripheral angiopathy without gangrene: Secondary | ICD-10-CM

## 2019-02-12 DIAGNOSIS — B351 Tinea unguium: Secondary | ICD-10-CM

## 2019-02-12 DIAGNOSIS — G4733 Obstructive sleep apnea (adult) (pediatric): Secondary | ICD-10-CM | POA: Diagnosis not present

## 2019-02-12 DIAGNOSIS — J45909 Unspecified asthma, uncomplicated: Secondary | ICD-10-CM | POA: Diagnosis not present

## 2019-02-12 NOTE — Patient Instructions (Signed)
Diabetes Mellitus and Foot Care Foot care is an important part of your health, especially when you have diabetes. Diabetes may cause you to have problems because of poor blood flow (circulation) to your feet and legs, which can cause your skin to:  Become thinner and drier.  Break more easily.  Heal more slowly.  Peel and crack. You may also have nerve damage (neuropathy) in your legs and feet, causing decreased feeling in them. This means that you may not notice minor injuries to your feet that could lead to more serious problems. Noticing and addressing any potential problems early is the best way to prevent future foot problems. How to care for your feet Foot hygiene  Wash your feet daily with warm water and mild soap. Do not use hot water. Then, pat your feet and the areas between your toes until they are completely dry. Do not soak your feet as this can dry your skin.  Trim your toenails straight across. Do not dig under them or around the cuticle. File the edges of your nails with an emery board or nail file.  Apply a moisturizing lotion or petroleum jelly to the skin on your feet and to dry, brittle toenails. Use lotion that does not contain alcohol and is unscented. Do not apply lotion between your toes. Shoes and socks  Wear clean socks or stockings every day. Make sure they are not too tight. Do not wear knee-high stockings since they may decrease blood flow to your legs.  Wear shoes that fit properly and have enough cushioning. Always look in your shoes before you put them on to be sure there are no objects inside.  To break in new shoes, wear them for just a few hours a day. This prevents injuries on your feet. Wounds, scrapes, corns, and calluses  Check your feet daily for blisters, cuts, bruises, sores, and redness. If you cannot see the bottom of your feet, use a mirror or ask someone for help.  Do not cut corns or calluses or try to remove them with medicine.  If you  find a minor scrape, cut, or break in the skin on your feet, keep it and the skin around it clean and dry. You may clean these areas with mild soap and water. Do not clean the area with peroxide, alcohol, or iodine.  If you have a wound, scrape, corn, or callus on your foot, look at it several times a day to make sure it is healing and not infected. Check for: ? Redness, swelling, or pain. ? Fluid or blood. ? Warmth. ? Pus or a bad smell. General instructions  Do not cross your legs. This may decrease blood flow to your feet.  Do not use heating pads or hot water bottles on your feet. They may burn your skin. If you have lost feeling in your feet or legs, you may not know this is happening until it is too late.  Protect your feet from hot and cold by wearing shoes, such as at the beach or on hot pavement.  Schedule a complete foot exam at least once a year (annually) or more often if you have foot problems. If you have foot problems, report any cuts, sores, or bruises to your health care provider immediately. Contact a health care provider if:  You have a medical condition that increases your risk of infection and you have any cuts, sores, or bruises on your feet.  You have an injury that is not   healing.  You have redness on your legs or feet.  You feel burning or tingling in your legs or feet.  You have pain or cramps in your legs and feet.  Your legs or feet are numb.  Your feet always feel cold.  You have pain around a toenail. Get help right away if:  You have a wound, scrape, corn, or callus on your foot and: ? You have pain, swelling, or redness that gets worse. ? You have fluid or blood coming from the wound, scrape, corn, or callus. ? Your wound, scrape, corn, or callus feels warm to the touch. ? You have pus or a bad smell coming from the wound, scrape, corn, or callus. ? You have a fever. ? You have a red line going up your leg. Summary  Check your feet every day  for cuts, sores, red spots, swelling, and blisters.  Moisturize feet and legs daily.  Wear shoes that fit properly and have enough cushioning.  If you have foot problems, report any cuts, sores, or bruises to your health care provider immediately.  Schedule a complete foot exam at least once a year (annually) or more often if you have foot problems. This information is not intended to replace advice given to you by your health care provider. Make sure you discuss any questions you have with your health care provider. Document Released: 11/16/2000 Document Revised: 01/01/2018 Document Reviewed: 12/21/2016 Elsevier Interactive Patient Education  2019 [employer]  Onychomycosis/Fungal Toenails  WHAT IS IT? An infection that lies within the keratin of your nail plate that is caused by a fungus.  WHY ME? Fungal infections affect all ages, sexes, races, and creeds.  There may be many factors that predispose you to a fungal infection such as age, coexisting medical conditions such as diabetes, or an autoimmune disease; stress, medications, fatigue, genetics, etc.  Bottom line: fungus thrives in a warm, moist environment and your shoes offer such a location.  IS IT CONTAGIOUS? Theoretically, yes.  You do not want to share shoes, nail clippers or files with someone who has fungal toenails.  Walking around barefoot in the same room or sleeping in the same bed is unlikely to transfer the organism.  It is important to realize, however, that fungus can spread easily from one nail to the next on the same foot.  HOW DO WE TREAT THIS?  There are several ways to treat this condition.  Treatment may depend on many factors such as age, medications, pregnancy, liver and kidney conditions, etc.  It is best to ask your doctor which options are available to you.  1. No treatment.   Unlike many other medical concerns, you can live with this condition.  However for many people this can be a painful condition and  may lead to ingrown toenails or a bacterial infection.  It is recommended that you keep the nails cut short to help reduce the amount of fungal nail. 2. Topical treatment.  These range from herbal remedies to prescription strength nail lacquers.  About 40-50% effective, topicals require twice daily application for approximately 9 to 12 months or until an entirely new nail has grown out.  The most effective topicals are medical grade medications available through physicians offices. 3. Oral antifungal medications.  With an 80-90% cure rate, the most common oral medication requires 3 to 4 months of therapy and stays in your system for a year as the new nail grows out.  Oral antifungal medications do require   blood work to make sure it is a safe drug for you.  A liver function panel will be performed prior to starting the medication and after the first month of treatment.  It is important to have the blood work performed to avoid any harmful side effects.  In general, this medication safe but blood work is required. 4. Laser Therapy.  This treatment is performed by applying a specialized laser to the affected nail plate.  This therapy is noninvasive, fast, and non-painful.  It is not covered by insurance and is therefore, out of pocket.  The results have been very good with a 80-95% cure rate.  [facility] is the only practice in the area to offer this therapy. 5. Permanent Nail Avulsion.  Removing the entire nail so that a new nail will not grow back. 

## 2019-02-21 DIAGNOSIS — R092 Respiratory arrest: Secondary | ICD-10-CM | POA: Diagnosis not present

## 2019-02-21 DIAGNOSIS — J189 Pneumonia, unspecified organism: Secondary | ICD-10-CM | POA: Diagnosis not present

## 2019-02-21 DIAGNOSIS — J4540 Moderate persistent asthma, uncomplicated: Secondary | ICD-10-CM | POA: Diagnosis not present

## 2019-02-21 DIAGNOSIS — J45909 Unspecified asthma, uncomplicated: Secondary | ICD-10-CM | POA: Diagnosis not present

## 2019-02-23 ENCOUNTER — Other Ambulatory Visit: Payer: Self-pay | Admitting: Internal Medicine

## 2019-02-23 ENCOUNTER — Encounter: Payer: Self-pay | Admitting: Podiatry

## 2019-02-23 NOTE — Telephone Encounter (Signed)
Pt stated that insurance requires a PA for Nexium refill.

## 2019-02-23 NOTE — Progress Notes (Signed)
Subjective: Patient presents today with diabetes and PAD with painful, discolored, thick toenails which interfere with daily activities. Pain is aggravated when wearing enclosed shoe gear. Pain is getting progressively worse and relieved with periodic professional debridement.  Shelby Kern, DO is her PCP. Last visit 12/02/2018.   Current Outpatient Medications:  .  albuterol (PROVENTIL) (2.5 MG/3ML) 0.083% nebulizer solution, Take 3 mLs (2.5 mg total) by nebulization every 6 (six) hours as needed for wheezing or shortness of breath (J45.40)., Disp: 75 mL, Rfl: 3 .  aspirin EC 81 MG tablet, Take 81 mg at bedtime by mouth. , Disp: , Rfl:  .  atorvastatin (LIPITOR) 40 MG tablet, TAKE 1 TABLET BY MOUTH EVERY DAY, Disp: 90 tablet, Rfl: 1 .  budesonide-formoterol (SYMBICORT) 80-4.5 MCG/ACT inhaler, INHALE 2 PUFFS INTO THE LUNGS EVERY MORNING AND ANOTHER 2 PUFFS 12 HOURS LATER, Disp: 30.6 Inhaler, Rfl: 1 .  CARAFATE 1 GM/10ML suspension, TAKE 10 MILLILITERS BY MOUTH 4 TIMES DAILY WITH MEALS AND AT BEDTIME, Disp: 420 mL, Rfl: 2 .  CINNAMON PO, Take 1,000 mg 2 (two) times daily by mouth., Disp: , Rfl:  .  Cyanocobalamin (VITAMIN B-12) 5000 MCG TBDP, Take 5,000 mcg by mouth daily. , Disp: , Rfl:  .  esomeprazole (NEXIUM) 40 MG capsule, Take 1 capsule (40 mg total) by mouth 2 (two) times daily., Disp: 60 capsule, Rfl: 11 .  fluticasone (FLONASE) 50 MCG/ACT nasal spray, Place 2 sprays at bedtime as needed into both nostrils for allergies or rhinitis. , Disp: , Rfl:  .  glipiZIDE (GLUCOTROL XL) 5 MG 24 hr tablet, Take 1 tablet (5 mg total) by mouth daily with breakfast., Disp: 90 tablet, Rfl: 3 .  hydrocortisone 2.5 % cream, APPLY TO AFFECTED AREA TWICE A DAY. FOR DERMATITIS ON EARS FOR 2 WEEKS, Disp: , Rfl: 3 .  isosorbide mononitrate (IMDUR) 60 MG 24 hr tablet, TAKE 1 TABLET BY MOUTH EVERY DAY, Disp: 90 tablet, Rfl: 1 .  L-Methylfolate (DEPLIN) 7.5 MG TABS, Take 7.5 mg by mouth daily with breakfast. ,  Disp: , Rfl:  .  LYRICA 150 MG capsule, TAKE 1 CAPSULE BY MOUTH EVERY 8 HOURS AS DIRECTED, Disp: , Rfl: 2 .  metFORMIN (GLUCOPHAGE) 500 MG tablet, TAKE 1 TABLET BY MOUTH EVERY DAY WITH BREAKFAST, Disp: 90 tablet, Rfl: 1 .  methocarbamol (ROBAXIN) 500 MG tablet, Take 1 tablet (500 mg total) by mouth every 6 (six) hours as needed for muscle spasms. (Patient taking differently: Take 500 mg 3 (three) times daily as needed by mouth for muscle spasms. ), Disp: 60 tablet, Rfl: 0 .  metoprolol tartrate (LOPRESSOR) 50 MG tablet, TAKE 1 TABLET TWICE A DAY, Disp: 180 tablet, Rfl: 1 .  Misc Natural Products (GLUCOS-CHONDROIT-MSM COMPLEX PO), Take 1 tablet 3 (three) times daily by mouth. , Disp: , Rfl:  .  montelukast (SINGULAIR) 10 MG tablet, TAKE 1 TABLET BY MOUTH AT BEDTIME, Disp: 90 tablet, Rfl: 0 .  Multiple Vitamin (MULITIVITAMIN WITH MINERALS) TABS, Take 1 tablet by mouth daily.  , Disp: , Rfl:  .  nitroGLYCERIN (NITROSTAT) 0.3 MG SL tablet, Place 0.3 mg under the tongue every 5 (five) minutes as needed for chest pain (as needed)., Disp: , Rfl:  .  nystatin cream (MYCOSTATIN), Apply 1 application topically 2 (two) times daily., Disp: 30 g, Rfl: 0 .  ondansetron (ZOFRAN) 4 MG tablet, Take 1 tablet (4 mg total) by mouth every 4 (four) hours as needed for nausea or vomiting., Disp: 30  tablet, Rfl: 11 .  Oxycodone HCl 20 MG TABS, Take 20 mg every 4 (four) hours as needed by mouth (pain). , Disp: , Rfl:  .  OXYGEN, Inhale 2 L into the lungs at bedtime., Disp: , Rfl:  .  potassium chloride (K-DUR) 10 MEQ tablet, TAKE 2 TABLETS BY MOUTH DAILY, Disp: 180 tablet, Rfl: 1 .  PROAIR HFA 108 (90 Base) MCG/ACT inhaler, INHALE 2 PUFFS INTO THE LUNGS EVERY 4 HOURS AS NEEDED FOR SHORTNESS OF BREATH, Disp: 8.5 Inhaler, Rfl: 3 .  promethazine (PHENERGAN) 25 MG tablet, Take 1 tablet (25 mg total) by mouth every 6 (six) hours as needed for nausea or vomiting., Disp: 30 tablet, Rfl: 11   Allergies  Allergen Reactions  .  Lodine [Etodolac] Anaphylaxis, Hives and Swelling  . Oxycontin [Oxycodone Hcl] Anaphylaxis    hives, trouble breathing, tongue swelling (Only Oxycontin) Tolerates plain oxycodone.  Marland Kitchen Penicillins Anaphylaxis    Told by a surgeon never to take it again. Has patient had a PCN reaction causing immediate rash, facial/tongue/throat swelling, SOB or lightheadedness with hypotension: Yes Has patient had a PCN reaction causing severe rash involving mucus membranes or skin necrosis: Unknown Has patient had a PCN reaction that required hospitalization: No Has patient had a PCN reaction occurring within the last 10 years: No If all of the above answers are "NO", then may proceed with Cephalosporin use.  . Aspirin Other (See Comments)    High-dose caused GI Bleeds  . Darvocet [Propoxyphene N-Acetaminophen] Hives  . Nitroglycerin     IV-BP drops dramatically  . Tramadol Other (See Comments)    UNKNOWN  . Ultram [Tramadol Hcl] Hives  . Valium Other (See Comments)    Circulation problems. "Legs turned black".     Objective:  Vascular Examination: Capillary refill time immediate x 10 digits.  Dorsalis pedis pulses 1/4 b/l.  Posterior tibial pulses 0/4 b/l.  Digital hair absent x 10 digits.  Skin temperature gradient WNL  b/l  Dermatological Examination: Skin with normal turgor, texture and tone b/l.  Toenails 1-5 b/l discolored, thick, dystrophic with subungual debris and pain with palpation to nailbeds due to thickness of nails.  Musculoskeletal: Muscle strength 5/5 to all LE muscle groups  Neurological: Sensation intact with 10 gram monofilament.  Vibratory sensation intact.  Assessment: 1. Painful onychomycosis toenails 1-5 b/l 2. NIDDM with Peripheral arterial disease  Plan: 1. Toenails 1-5 b/l were debrided in length and girth without iatrogenic bleeding. 2. Patient to continue soft, supportive shoe gear daily. 3. Patient to report any pedal injuries to medical professional  immediately. 4. Follow up 3 months. 5. Patient/POA to call should there be a concern in the interim.

## 2019-02-26 DIAGNOSIS — S9422XS Injury of deep peroneal nerve at ankle and foot level, left leg, sequela: Secondary | ICD-10-CM | POA: Diagnosis not present

## 2019-02-26 DIAGNOSIS — G894 Chronic pain syndrome: Secondary | ICD-10-CM | POA: Diagnosis not present

## 2019-02-26 DIAGNOSIS — Z79891 Long term (current) use of opiate analgesic: Secondary | ICD-10-CM | POA: Diagnosis not present

## 2019-02-26 DIAGNOSIS — M47816 Spondylosis without myelopathy or radiculopathy, lumbar region: Secondary | ICD-10-CM | POA: Diagnosis not present

## 2019-02-27 NOTE — Telephone Encounter (Signed)
Initiated PA on Nexium on cover my meds.

## 2019-03-02 NOTE — Telephone Encounter (Signed)
Received denial from [facility] stating the information provided was not sufficient to support approval. Called [facility] [facility] and explained that I answered the questions that the patient has tried and failed 2 of the formulary medications and I have explained the reason why they failed the medications. The forwarded me to the appeals department. They initiated the appeal ref #[id number]. They will contact me if I need to type an appeal letter.

## 2019-03-03 NOTE — Telephone Encounter (Signed)
Attempted to contact number provided and phone number was not a valid number.

## 2019-03-03 NOTE — Telephone Encounter (Signed)
Insurance called back regarding Nexium. Call back: [phone]

## 2019-03-03 NOTE — Telephone Encounter (Signed)
Received fax approval for Nexium 40 mg exception through 12/03/19 under Medicare part D benefit.

## 2019-03-09 NOTE — Telephone Encounter (Signed)
This encounter was created in error - please disregard.

## 2019-03-10 ENCOUNTER — Other Ambulatory Visit: Payer: Self-pay | Admitting: *Deleted

## 2019-03-13 DIAGNOSIS — J189 Pneumonia, unspecified organism: Secondary | ICD-10-CM | POA: Diagnosis not present

## 2019-03-13 DIAGNOSIS — G4733 Obstructive sleep apnea (adult) (pediatric): Secondary | ICD-10-CM | POA: Diagnosis not present

## 2019-03-13 DIAGNOSIS — R0902 Hypoxemia: Secondary | ICD-10-CM | POA: Diagnosis not present

## 2019-03-13 DIAGNOSIS — J45909 Unspecified asthma, uncomplicated: Secondary | ICD-10-CM | POA: Diagnosis not present

## 2019-03-21 ENCOUNTER — Other Ambulatory Visit: Payer: Self-pay | Admitting: Family Medicine

## 2019-03-22 ENCOUNTER — Other Ambulatory Visit: Payer: Self-pay | Admitting: Family Medicine

## 2019-03-22 ENCOUNTER — Other Ambulatory Visit: Payer: Self-pay | Admitting: Pulmonary Disease

## 2019-03-24 ENCOUNTER — Other Ambulatory Visit: Payer: Self-pay

## 2019-03-24 DIAGNOSIS — J189 Pneumonia, unspecified organism: Secondary | ICD-10-CM | POA: Diagnosis not present

## 2019-03-24 DIAGNOSIS — J4540 Moderate persistent asthma, uncomplicated: Secondary | ICD-10-CM | POA: Diagnosis not present

## 2019-03-24 DIAGNOSIS — J45909 Unspecified asthma, uncomplicated: Secondary | ICD-10-CM | POA: Diagnosis not present

## 2019-03-24 DIAGNOSIS — R0902 Hypoxemia: Secondary | ICD-10-CM | POA: Diagnosis not present

## 2019-03-24 MED ORDER — SUCRALFATE 1 GM/10ML PO SUSP: ORAL | 2 refills | Status: DC

## 2019-03-24 NOTE — Progress Notes (Signed)
OK to refill per Dr. Henrene Pastor

## 2019-03-27 DIAGNOSIS — S9422XS Injury of deep peroneal nerve at ankle and foot level, left leg, sequela: Secondary | ICD-10-CM | POA: Diagnosis not present

## 2019-03-27 DIAGNOSIS — Z79891 Long term (current) use of opiate analgesic: Secondary | ICD-10-CM | POA: Diagnosis not present

## 2019-03-27 DIAGNOSIS — G894 Chronic pain syndrome: Secondary | ICD-10-CM | POA: Diagnosis not present

## 2019-03-27 DIAGNOSIS — M47816 Spondylosis without myelopathy or radiculopathy, lumbar region: Secondary | ICD-10-CM | POA: Diagnosis not present

## 2019-03-31 ENCOUNTER — Other Ambulatory Visit: Payer: Self-pay

## 2019-03-31 MED ORDER — SUCRALFATE 1 G PO TABS: 1.0000 g | ORAL_TABLET | Freq: Three times a day (TID) | ORAL | 5 refills | Status: DC

## 2019-03-31 NOTE — Progress Notes (Signed)
rec'd refill request from [facility] to switch to tablet form of Carafate due to expense.

## 2019-04-02 ENCOUNTER — Other Ambulatory Visit: Payer: Self-pay

## 2019-04-02 ENCOUNTER — Ambulatory Visit (INDEPENDENT_AMBULATORY_CARE_PROVIDER_SITE_OTHER): Payer: Medicare Other | Admitting: Family Medicine

## 2019-04-02 ENCOUNTER — Encounter: Payer: Self-pay | Admitting: Family Medicine

## 2019-04-02 VITALS — BP 136/69 | Wt 222.0 lb

## 2019-04-02 DIAGNOSIS — E119 Type 2 diabetes mellitus without complications: Secondary | ICD-10-CM

## 2019-04-02 DIAGNOSIS — E785 Hyperlipidemia, unspecified: Secondary | ICD-10-CM | POA: Diagnosis not present

## 2019-04-02 DIAGNOSIS — E1169 Type 2 diabetes mellitus with other specified complication: Secondary | ICD-10-CM

## 2019-04-02 DIAGNOSIS — I5032 Chronic diastolic (congestive) heart failure: Secondary | ICD-10-CM

## 2019-04-02 DIAGNOSIS — E538 Deficiency of other specified B group vitamins: Secondary | ICD-10-CM

## 2019-04-02 DIAGNOSIS — I10 Essential (primary) hypertension: Secondary | ICD-10-CM

## 2019-04-02 DIAGNOSIS — M25521 Pain in right elbow: Secondary | ICD-10-CM | POA: Diagnosis not present

## 2019-04-02 DIAGNOSIS — E1159 Type 2 diabetes mellitus with other circulatory complications: Secondary | ICD-10-CM | POA: Diagnosis not present

## 2019-04-02 DIAGNOSIS — F3341 Major depressive disorder, recurrent, in partial remission: Secondary | ICD-10-CM

## 2019-04-02 NOTE — Progress Notes (Signed)
Virtual Visit via Video Note  I connected with Shelby Anderson  on 04/02/19 at  4:00 PM EDT by a video enabled telemedicine application and verified that I am speaking with the correct person using two identifiers.  Location patient: home Location provider:work or home office Persons participating in the virtual visit: patient, provider  I discussed the limitations of evaluation and management by telemedicine and the availability of in person appointments. The patient expressed understanding and agreed to proceed.   HPI:  Shelby Anderson is a pleasant 63 y.o. here for follow up. Chronic medical problems summarized below were reviewed for changes and stability and were updated as needed below. These issues and their treatment remain stable for the most part.  Reports tripped on the steps a few days ago. No LOC. No bleeding. Landed on the R elbow on concrete. Has some pain and bruising of the R elbow. Can flex and extend arm ok. Feels numb if rests on this elbow. No HA, neck pain, concussion.  Denies CP, SOB, DOE, treatment intolerance or new symptoms.reports mood has been ok. Taking reduce dose b12.   Diabetes Mellitus: -takes cinnamon, glipizide, metformin added 04/2018 -no regular exercise, but she is active taking care of grandchildren -hx CAD (sees cardiologist), HLD, HTN, orthostattic hypotension - on statin and asa, not on acei - has asthma  GERD/constipation: -hx bad reflux, esophageal stricture s/p dilation and gastric ulcer -sees Dr. Henrene Pastor for this  -on nexium 40mg  bid, carafate, prn phenergan, colace  CAD hx MI x2 per her report/Arrythmia/HTN/HLD/orthostatic hypotension/presyncope: -seeing several cardiologists -meds: asa,statin,metoprolol, fish oil, isosorbid, K+, ntg prn -denies: CP, SOB, palpitations -on florinef in the past  Asthma/Allergies: -sees pulmonologist for this -meds: singulair, symbicort, albuterol, flonase  Insomnia/OSA: -uses benydrl occ for  this -eszopiclone on med list from ? [facility]-reports she very rarely takes this, may be a few times per year. -hx of low O2 at night -seeing sleep med, Dr. Ander Slade  Chronic back pain/Knee pain: -hx DDD and neuropathy, OA knees -seeing [place] pain management specialist and referred to ortho -on lyrica, deplin, robaxin -on methadone in the past and opana - reports reacted to them -aware of risks with medications, adamant about continuing mobic as feels is only medication that helps  MDD: -reports on deplin from pain management for this, does not feel helps -declined other treatments offered here -Chronic symptoms: mildly constant depressed mood and hopeless feelings at times, no SI, panic or manic symptoms -reports stable  B12 def: -on high dose b12 - reports started in hospital  HRT/postmenopausl bleeding: -on prometrium with her gynecologist, Dr. Toney Rakes  ROS: See pertinent positives and negatives per HPI.  Past Medical History:  Diagnosis Date  . Allergy   . Anemia    "chronic"  . Angina   . Anxiety and depression   . Arthritis   . Asthma   . Atrial fibrillation ([place])    h/o "AF w/frequent PVCs"  . Cancer (HCC)    hx of skin cancer   . CHF (congestive heart failure) ([place])   . Chronic back pain greater than 3 months duration    on chronic narcotics, treated at pain clinic  . Colon polyps    hyperplastic  . Coronary artery disease    Arrythmia, orthostatic hypotension, HLD, HTN; sees Dr. Einar Gip  . Difficult intubation    "TMJ & woke up when they were still cutting on me"  . Dysrhythmia    sees Dr. Einar Gip and a cardiologist at [facility]  .  Esophageal stricture   . Fatty liver   . Fibroids   . Fibromyalgia    "in my legs"  . GERD (gastroesophageal reflux disease)    hx hiatal hernia, stricture and gastric ulcer  . Headache(784.0)   . Heart murmur   . Hiatal hernia   . History of loop recorder   . History of migraines    "dx'd when I was in my  teens"  . Hyperlipemia   . Hypertension   . Mental disorder   . Mild episode of recurrent major depressive disorder ([place]) 12/06/2015  . Myocardial infarction [facility]) 1980's & 1990;   sees Dr. Einar Gip  . OSA (obstructive sleep apnea)   . Pneumonia   . PONV (postoperative nausea and vomiting)   . Recurrent upper respiratory infection (URI)   . Shortness of breath 11/20/11   "all the time", sees pulmonlogy, ? asthma  . Stenotic cervical os   . Stomach ulcer    "3 small; found in 05/2011"  . TMJ (dislocation of temporomandibular joint)   . Tuberculosis    + TB SKIN TEST  . Type 2 diabetes mellitus without complication, without long-term current use of insulin ([place]) 12/06/2015   not on meds     Past Surgical History:  Procedure Laterality Date  . ACHILLES TENDON REPAIR  1970's   left ankle  . ARTHROSCOPIC REPAIR ACL     left knee cap  . BREAST BIOPSY Right 04/08/2013  . CARDIAC CATHETERIZATION     loop recorder  . CARPAL TUNNEL RELEASE  unknown   left hand  . ESOPHAGOGASTRODUODENOSCOPY (EGD) WITH PROPOFOL N/A 10/29/2017   Procedure: ESOPHAGOGASTRODUODENOSCOPY (EGD) WITH PROPOFOL;  Surgeon: Irene Shipper, MD;  Location: [facility] END[facility];  Service: Endoscopy;  Laterality: N/A;  . LOOP RECORDER IMPLANT    . post ganglionectomy  1970's   "for migraine headaches"  . pouch string  [phone]   "did this 3 times (once w/each pregnancy)"  . SAVORY DILATION N/A 10/29/2017   Procedure: SAVORY DILATION;  Surgeon: Irene Shipper, MD;  Location: [facility];  Service: Endoscopy;  Laterality: N/A;  . TOTAL KNEE ARTHROPLASTY Left 09/25/2016   Procedure: LEFT TOTAL KNEE ARTHROPLASTY;  Surgeon: Paralee Cancel, MD;  Location: [facility] OR[facility];  Service: Orthopedics;  Laterality: Left;  . TUBAL LIGATION  1980's    Family History  Problem Relation Age of Onset  . Malignant hyperthermia Father   . Hypertension Father   . Heart disease Father   . Diabetes Father   . Cancer Father        skin  . Hypertension Mother    . Heart disease Mother   . Cancer Sister        CERVICAL  . Hypertension Sister   . Cancer Brother        MELANOMA  . Heart disease Maternal Grandmother   . Heart disease Maternal Grandfather   . Cancer Paternal Grandmother        ?   Marland Kitchen Heart disease Paternal Grandmother   . Heart disease Paternal Grandfather   . Cancer Brother        LUNG  . Diabetes Sister   . Hypertension Sister   . Heart disease Sister   . Cancer Sister   . Cancer Brother   . Anesthesia problems Neg Hx   . Hypotension Neg Hx   . Pseudochol deficiency Neg Hx   . Colon cancer Neg Hx   . Esophageal cancer Neg Hx   . Pancreatic  cancer Neg Hx   . Rectal cancer Neg Hx   . Stomach cancer Neg Hx   . Breast cancer Neg Hx     SOCIAL HX: see hpi   Current Outpatient Medications:  .  albuterol (PROVENTIL) (2.5 MG/3ML) 0.083% nebulizer solution, Take 3 mLs (2.5 mg total) by nebulization every 6 (six) hours as needed for wheezing or shortness of breath (J45.40)., Disp: 75 mL, Rfl: 3 .  aspirin EC 81 MG tablet, Take 81 mg at bedtime by mouth. , Disp: , Rfl:  .  atorvastatin (LIPITOR) 40 MG tablet, TAKE 1 TABLET BY MOUTH EVERY DAY, Disp: 90 tablet, Rfl: 1 .  budesonide-formoterol (SYMBICORT) 80-4.5 MCG/ACT inhaler, INHALE 2 PUFFS INTO THE LUNGS EVERY MORNING AND ANOTHER 2 PUFFS 12 HOURS LATER, Disp: 30.6 Inhaler, Rfl: 1 .  CINNAMON PO, Take 1,000 mg 2 (two) times daily by mouth., Disp: , Rfl:  .  Cyanocobalamin (VITAMIN B-12) 5000 MCG TBDP, Take 5,000 mcg by mouth daily. , Disp: , Rfl:  .  esomeprazole (NEXIUM) 40 MG capsule, Take 1 capsule (40 mg total) by mouth 2 (two) times daily., Disp: 60 capsule, Rfl: 11 .  fluticasone (FLONASE) 50 MCG/ACT nasal spray, Place 2 sprays at bedtime as needed into both nostrils for allergies or rhinitis. , Disp: , Rfl:  .  glipiZIDE (GLUCOTROL XL) 5 MG 24 hr tablet, Take 1 tablet (5 mg total) by mouth daily with breakfast., Disp: 90 tablet, Rfl: 3 .  hydrocortisone 2.5 % cream,  APPLY TO AFFECTED AREA TWICE A DAY. FOR DERMATITIS ON EARS FOR 2 WEEKS, Disp: , Rfl: 3 .  isosorbide mononitrate (IMDUR) 60 MG 24 hr tablet, TAKE 1 TABLET BY MOUTH EVERY DAY, Disp: 90 tablet, Rfl: 1 .  L-Methylfolate (DEPLIN) 7.5 MG TABS, Take 7.5 mg by mouth daily with breakfast. , Disp: , Rfl:  .  LYRICA 150 MG capsule, TAKE 1 CAPSULE BY MOUTH EVERY 8 HOURS AS DIRECTED, Disp: , Rfl: 2 .  metFORMIN (GLUCOPHAGE) 500 MG tablet, TAKE 1 TABLET BY MOUTH EVERY DAY WITH BREAKFAST, Disp: 90 tablet, Rfl: 1 .  methocarbamol (ROBAXIN) 500 MG tablet, Take 1 tablet (500 mg total) by mouth every 6 (six) hours as needed for muscle spasms. (Patient taking differently: Take 500 mg 3 (three) times daily as needed by mouth for muscle spasms. ), Disp: 60 tablet, Rfl: 0 .  metoprolol tartrate (LOPRESSOR) 50 MG tablet, TAKE 1 TABLET TWICE A DAY, Disp: 180 tablet, Rfl: 1 .  Misc Natural Products (GLUCOS-CHONDROIT-MSM COMPLEX PO), Take 1 tablet 3 (three) times daily by mouth. , Disp: , Rfl:  .  montelukast (SINGULAIR) 10 MG tablet, TAKE 1 TABLET BY MOUTH EVERYDAY AT BEDTIME, Disp: 90 tablet, Rfl: 0 .  Multiple Vitamin (MULITIVITAMIN WITH MINERALS) TABS, Take 1 tablet by mouth daily.  , Disp: , Rfl:  .  nitroGLYCERIN (NITROSTAT) 0.3 MG SL tablet, Place 0.3 mg under the tongue every 5 (five) minutes as needed for chest pain (as needed)., Disp: , Rfl:  .  nystatin cream (MYCOSTATIN), Apply 1 application topically 2 (two) times daily., Disp: 30 g, Rfl: 0 .  ondansetron (ZOFRAN) 4 MG tablet, Take 1 tablet (4 mg total) by mouth every 4 (four) hours as needed for nausea or vomiting., Disp: 30 tablet, Rfl: 11 .  Oxycodone HCl 20 MG TABS, Take 20 mg every 4 (four) hours as needed by mouth (pain). , Disp: , Rfl:  .  OXYGEN, Inhale 2 L into the lungs at bedtime., Disp: ,  Rfl:  .  potassium chloride (K-DUR) 10 MEQ tablet, TAKE 2 TABLETS BY MOUTH DAILY, Disp: 180 tablet, Rfl: 1 .  PROAIR HFA 108 (90 Base) MCG/ACT inhaler, INHALE 2  PUFFS INTO THE LUNGS EVERY 4 HOURS AS NEEDED FOR SHORTNESS OF BREATH, Disp: 8.5 Inhaler, Rfl: 3 .  promethazine (PHENERGAN) 25 MG tablet, Take 1 tablet (25 mg total) by mouth every 6 (six) hours as needed for nausea or vomiting., Disp: 30 tablet, Rfl: 11 .  sucralfate (CARAFATE) 1 g tablet, Take 1 tablet (1 g total) by mouth 4 (four) times daily -  with meals and at bedtime. Before taking slowly dissolve tablet in 1 Tablespoon of distilled water for about 15 minutes to creat a slurry., Disp: 90 tablet, Rfl: 5  EXAM:  VITALS per patient if applicable: BP 673/41 (reports usually 110/70) Does not have blood glucose monitor Wt 222 Body mass index is 36.38 kg/m.   GENERAL: alert, oriented, appears well and in no acute distress  HEENT: atraumatic, conjunttiva clear, no obvious abnormalities on inspection of external nose and ears  NECK: normal movements of the head and neck  LUNGS: on inspection no signs of respiratory distress, breathing rate appears normal, no obvious gross SOB, gasping or wheezing  CV: no obvious cyanosis  MS: moves all visible extremities without noticeable abnormality, she does have some mild difuse swelling and mild bruising over the R elbow, she reports is TTP over olecranon, appears to have full ROM in elbow flexion/ext, forearm pro/sup  PSYCH/NEURO: pleasant and cooperative, no obvious depression or anxiety, speech and thought processing grossly intact  ASSESSMENT AND PLAN:  Discussed the following assessment and plan:  Hyperlipidemia associated with type 2 diabetes mellitus ([place]) - Plan: Lipid panel  -reports doing well -check lipids fastin  Right elbow pain - Plan: DG Elbow Complete Right -discussed potential etiologies and options for evaluation -xray - will get jo anne to arrange, ice, symptomatic care -further tx pending results -follow up 3 weeks, sooner if worsening or new concerns  Type 2 diabetes mellitus without complication, without long-term  current use of insulin ([place]) - Plan: Hemoglobin P3X, Basic metabolic panel -lifestyle recs -cont current meds -has had some wt reduction -labs per orders -follow up 3 months  Hypertension associated with diabetes ([place]) -cont current treatment -bp a little up today, but may be from pain as she reports is normally lower -recheck at follow up  B12 deficiency - Plan: Vitamin B12 -check b12 on reduced dose  Morbid obesity due to excess calories ([place]) -lifestyle recs, congratulated on some wt reduction  Chronic diastolic congestive heart failure ([place])  Episode of recurrent major depressive disorder, unspecified depression episode severity ([place]) -sees cardiology for managment    I discussed the assessment and treatment plan with the patient. The patient was provided an opportunity to ask questions and all were answered. The patient agreed with the plan and demonstrated an understanding of the instructions.   The patient was advised to call back or seek an in-person evaluation if the symptoms worsen or if the condition fails to improve as anticipated.   Follow up instructions: Advised assistant Wendie Simmer to help patient arrange the following: -fasting lab visit and xray visit same morning in next 5 days -phq9 in epic -follow up elbow pain/BP recheck with Dr. [name] in 3 weeks -TOC with Dr. Ethlyn Gallery 3 months   Lucretia Kern, DO

## 2019-04-10 ENCOUNTER — Ambulatory Visit (INDEPENDENT_AMBULATORY_CARE_PROVIDER_SITE_OTHER): Payer: Medicare Other

## 2019-04-10 ENCOUNTER — Other Ambulatory Visit: Payer: Self-pay

## 2019-04-10 ENCOUNTER — Other Ambulatory Visit: Payer: Medicare Other

## 2019-04-10 DIAGNOSIS — S59901A Unspecified injury of right elbow, initial encounter: Secondary | ICD-10-CM | POA: Diagnosis not present

## 2019-04-10 DIAGNOSIS — E1159 Type 2 diabetes mellitus with other circulatory complications: Secondary | ICD-10-CM | POA: Diagnosis not present

## 2019-04-10 DIAGNOSIS — E119 Type 2 diabetes mellitus without complications: Secondary | ICD-10-CM | POA: Diagnosis not present

## 2019-04-10 DIAGNOSIS — E785 Hyperlipidemia, unspecified: Secondary | ICD-10-CM | POA: Diagnosis not present

## 2019-04-10 DIAGNOSIS — E538 Deficiency of other specified B group vitamins: Secondary | ICD-10-CM | POA: Diagnosis not present

## 2019-04-10 DIAGNOSIS — M25521 Pain in right elbow: Secondary | ICD-10-CM

## 2019-04-10 DIAGNOSIS — E1169 Type 2 diabetes mellitus with other specified complication: Secondary | ICD-10-CM | POA: Diagnosis not present

## 2019-04-10 LAB — BASIC METABOLIC PANEL
BUN: 11 mg/dL (ref 6–23)
CO2: 34 mEq/L — ABNORMAL HIGH (ref 19–32)
Calcium: 8.8 mg/dL (ref 8.4–10.5)
Chloride: 101 mEq/L (ref 96–112)
Creatinine, Ser: 0.63 mg/dL (ref 0.40–1.20)
GFR: 96.05 mL/min (ref >60.00)
Glucose, Bld: 114 mg/dL — ABNORMAL HIGH (ref 70–99)
Potassium: 4 mEq/L (ref 3.5–5.1)
Sodium: 142 mEq/L (ref 135–145)

## 2019-04-10 LAB — LIPID PANEL
Cholesterol: 116 mg/dL (ref 0–200)
HDL: 38.7 mg/dL — ABNORMAL LOW (ref >39.00)
LDL Cholesterol: 44 mg/dL (ref 0–99)
NonHDL: 76.85
Total CHOL/HDL Ratio: 3
Triglycerides: 166 mg/dL — ABNORMAL HIGH (ref 0.0–149.0)
VLDL: 33.2 mg/dL (ref 0.0–40.0)

## 2019-04-10 LAB — HEMOGLOBIN A1C: Hgb A1c MFr Bld: 7.3 % — ABNORMAL HIGH (ref 4.6–6.5)

## 2019-04-10 LAB — VITAMIN B12: Vitamin B-12: 1020 pg/mL — ABNORMAL HIGH (ref 211–911)

## 2019-04-12 DIAGNOSIS — R0902 Hypoxemia: Secondary | ICD-10-CM | POA: Diagnosis not present

## 2019-04-12 DIAGNOSIS — J45909 Unspecified asthma, uncomplicated: Secondary | ICD-10-CM | POA: Diagnosis not present

## 2019-04-12 DIAGNOSIS — G4733 Obstructive sleep apnea (adult) (pediatric): Secondary | ICD-10-CM | POA: Diagnosis not present

## 2019-04-12 DIAGNOSIS — J189 Pneumonia, unspecified organism: Secondary | ICD-10-CM | POA: Diagnosis not present

## 2019-04-13 ENCOUNTER — Telehealth: Payer: Self-pay | Admitting: *Deleted

## 2019-04-13 NOTE — Telephone Encounter (Signed)
Copied from [place] [id number]. Topic: General - Other >> Apr 13, 2019  2:20 PM Carolyn Stare wrote:  Pt has an appt 5.21.2020 for a fup with Dr [name] for a fall down the steps. call today to say she is now having more hip pain and some pain in her shoulder . And is saying she need an ppt before then  Would also like xray and lab results

## 2019-04-14 ENCOUNTER — Other Ambulatory Visit: Payer: Self-pay

## 2019-04-14 ENCOUNTER — Telehealth: Payer: Self-pay | Admitting: Family Medicine

## 2019-04-14 ENCOUNTER — Ambulatory Visit (INDEPENDENT_AMBULATORY_CARE_PROVIDER_SITE_OTHER): Payer: Medicare Other | Admitting: Family Medicine

## 2019-04-14 ENCOUNTER — Telehealth: Payer: Self-pay | Admitting: *Deleted

## 2019-04-14 DIAGNOSIS — M25512 Pain in left shoulder: Secondary | ICD-10-CM | POA: Diagnosis not present

## 2019-04-14 DIAGNOSIS — M5442 Lumbago with sciatica, left side: Secondary | ICD-10-CM | POA: Diagnosis not present

## 2019-04-14 DIAGNOSIS — E119 Type 2 diabetes mellitus without complications: Secondary | ICD-10-CM

## 2019-04-14 MED ORDER — METFORMIN HCL 500 MG PO TABS: 500.0000 mg | ORAL_TABLET | Freq: Two times a day (BID) | ORAL | 1 refills | Status: DC

## 2019-04-14 NOTE — Telephone Encounter (Signed)
Copied from [place]. Topic: General - Other >> Apr 14, 2019 11:59 AM Keene Breath wrote: Reason for CRM: Patient called regarding her appt. Today.  She stated she missed the call and would like a call back.  She did not realize the appt. Would be from the office number. Please call patient at [phone]

## 2019-04-14 NOTE — Telephone Encounter (Signed)
I called the pt, cancelled appt on 5/21 and scheduled an appt for a phone visit in 1 month.  Exercises were mailed to the pts home address and she declined a glucometer at this time.  Stated she will call back if she feels her sugar levels are getting high or changing.

## 2019-04-14 NOTE — Telephone Encounter (Signed)
Dr [name] was advised and will contact the pt.

## 2019-04-14 NOTE — Telephone Encounter (Signed)
I called the pt and scheduled an appt for today at 11:40am.

## 2019-04-14 NOTE — Patient Instructions (Signed)
-  We placed a referral for you as discussed to the specialist regarding your back. It usually takes about 1-2 weeks to process and schedule this referral. If you have not heard from [place] regarding this appointment in 2 weeks please contact our office.  -I asked my assistant, Wendie Simmer, to send you some exercises you can do for your shoulder and back at home. Please let us know if you do not receive them in the next 1 week.  -can use heat and topical menthol (tiger balm) along with treatments from you pain specialist for the pain.  -for the diabetes: Increase the Metformin to 500mg  twice daily Eat a healthy low sugar diet and stay away from sweets, starchy white foods and processed foods. Eat lean proteins such as fish and poultry and at least 5-7 servings of colorful vegetables per day. Avoid potatoes and corn.  -follow up in 4 weeks.  -I hope you are feeling better soon! Seek care promptly if your symptoms worsen, new concerns arise or you are not improving with treatment.

## 2019-04-14 NOTE — Progress Notes (Signed)
Virtual Visit via Video Note  I connected with Shelby Anderson  on 04/16/19 at 11:40 AM EDT by a video enabled telemedicine application and verified that I am speaking with the correct person using two identifiers. She was not able to use the video application today, so we completed the visit successfully in audio instead.  Location patient: home Location provider:work or home office Persons participating in the virtual visit: patient, provider.   HPI:  Acute visit for hip and shoulder pain and follow up lab results.  Hip and shoulder pain: -today reports: L hip and shoulder pain the last week or so -report shoulder pain is mild, "not bad" in the L lateral shoulder and only occurs with elevation of arm above 90 degrees -back pain is in the L L back radiating to the butt, hip, worse with activities, better with rest -denies:fevers, malaise, weakness, numbness, bowel or bladder incontinence -sees pain management but meds don't help and she reports they never do anything to actually diagnose or treat the problem other then giving her more meds which she doesn't want.  -Seen n 04/02/19 w/ report of R elbow pain s/p fall on steps around 4/28. Xray without any acute findings per radiology report - but did have "Soft tissue calcification along the LATERAL epicondyle suggesting remote injury or LATERAL epicondylitis."   Diabetes/Follow up labs: -Hgba1c 7.3 -current meds: asa, statin, glipizide xl 5mg  daily, metformin 500mg  once daily  ROS: See pertinent positives and negatives per HPI.  Past Medical History:  Diagnosis Date  . Allergy   . Anemia    "chronic"  . Angina   . Anxiety and depression   . Arthritis   . Asthma   . Atrial fibrillation ([place])    h/o "AF w/frequent PVCs"  . Cancer (HCC)    hx of skin cancer   . CHF (congestive heart failure) ([place])   . Chronic back pain greater than 3 months duration    on chronic narcotics, treated at pain clinic  . Colon polyps    hyperplastic  .  Coronary artery disease    Arrythmia, orthostatic hypotension, HLD, HTN; sees Dr. Einar Gip  . Difficult intubation    "TMJ & woke up when they were still cutting on me"  . Dysrhythmia    sees Dr. Einar Gip and a cardiologist at [facility]  . Esophageal stricture   . Fatty liver   . Fibroids   . Fibromyalgia    "in my legs"  . GERD (gastroesophageal reflux disease)    hx hiatal hernia, stricture and gastric ulcer  . Headache(784.0)   . Heart murmur   . Hiatal hernia   . History of loop recorder   . History of migraines    "dx'd when I was in my teens"  . Hyperlipemia   . Hypertension   . Mental disorder   . Mild episode of recurrent major depressive disorder ([place]) 12/06/2015  . Myocardial infarction [facility]) 1980's & 1990;   sees Dr. Einar Gip  . OSA (obstructive sleep apnea)   . Pneumonia   . PONV (postoperative nausea and vomiting)   . Recurrent upper respiratory infection (URI)   . Shortness of breath 11/20/11   "all the time", sees pulmonlogy, ? asthma  . Stenotic cervical os   . Stomach ulcer    "3 small; found in 05/2011"  . TMJ (dislocation of temporomandibular joint)   . Tuberculosis    + TB SKIN TEST  . Type 2 diabetes mellitus without complication, without long-term  current use of insulin ([place]) 12/06/2015   not on meds     Past Surgical History:  Procedure Laterality Date  . ACHILLES TENDON REPAIR  1970's   left ankle  . ARTHROSCOPIC REPAIR ACL     left knee cap  . BREAST BIOPSY Right 04/08/2013  . CARDIAC CATHETERIZATION     loop recorder  . CARPAL TUNNEL RELEASE  unknown   left hand  . ESOPHAGOGASTRODUODENOSCOPY (EGD) WITH PROPOFOL N/A 10/29/2017   Procedure: ESOPHAGOGASTRODUODENOSCOPY (EGD) WITH PROPOFOL;  Surgeon: Irene Shipper, MD;  Location: [facility];  Service: Endoscopy;  Laterality: N/A;  . LOOP RECORDER IMPLANT    . post ganglionectomy  1970's   "for migraine headaches"  . pouch string  [phone]   "did this 3 times (once w/each pregnancy)"  . SAVORY  DILATION N/A 10/29/2017   Procedure: SAVORY DILATION;  Surgeon: Irene Shipper, MD;  Location: [facility];  Service: Endoscopy;  Laterality: N/A;  . TOTAL KNEE ARTHROPLASTY Left 09/25/2016   Procedure: LEFT TOTAL KNEE ARTHROPLASTY;  Surgeon: Paralee Cancel, MD;  Location: [facility];  Service: Orthopedics;  Laterality: Left;  . TUBAL LIGATION  1980's    Family History  Problem Relation Age of Onset  . Malignant hyperthermia Father   . Hypertension Father   . Heart disease Father   . Diabetes Father   . Cancer Father        skin  . Hypertension Mother   . Heart disease Mother   . Cancer Sister        CERVICAL  . Hypertension Sister   . Cancer Brother        MELANOMA  . Heart disease Maternal Grandmother   . Heart disease Maternal Grandfather   . Cancer Paternal Grandmother        ?   Marland Kitchen Heart disease Paternal Grandmother   . Heart disease Paternal Grandfather   . Cancer Brother        LUNG  . Diabetes Sister   . Hypertension Sister   . Heart disease Sister   . Cancer Sister   . Cancer Brother   . Anesthesia problems Neg Hx   . Hypotension Neg Hx   . Pseudochol deficiency Neg Hx   . Colon cancer Neg Hx   . Esophageal cancer Neg Hx   . Pancreatic cancer Neg Hx   . Rectal cancer Neg Hx   . Stomach cancer Neg Hx   . Breast cancer Neg Hx     SOCIAL HX:see hpi   Current Outpatient Medications:  .  albuterol (PROVENTIL) (2.5 MG/3ML) 0.083% nebulizer solution, Take 3 mLs (2.5 mg total) by nebulization every 6 (six) hours as needed for wheezing or shortness of breath (J45.40)., Disp: 75 mL, Rfl: 3 .  aspirin EC 81 MG tablet, Take 81 mg at bedtime by mouth. , Disp: , Rfl:  .  atorvastatin (LIPITOR) 40 MG tablet, TAKE 1 TABLET BY MOUTH EVERY DAY, Disp: 90 tablet, Rfl: 1 .  budesonide-formoterol (SYMBICORT) 80-4.5 MCG/ACT inhaler, INHALE 2 PUFFS INTO THE LUNGS EVERY MORNING AND ANOTHER 2 PUFFS 12 HOURS LATER, Disp: 30.6 Inhaler, Rfl: 1 .  CINNAMON PO, Take 1,000 mg 2 (two) times  daily by mouth., Disp: , Rfl:  .  Cyanocobalamin (VITAMIN B-12) 5000 MCG TBDP, Take 5,000 mcg by mouth daily. , Disp: , Rfl:  .  esomeprazole (NEXIUM) 40 MG capsule, Take 1 capsule (40 mg total) by mouth 2 (two) times daily., Disp: 60 capsule, Rfl: 11 .  fluticasone (FLONASE) 50 MCG/ACT nasal spray, Place 2 sprays at bedtime as needed into both nostrils for allergies or rhinitis. , Disp: , Rfl:  .  glipiZIDE (GLUCOTROL XL) 5 MG 24 hr tablet, Take 1 tablet (5 mg total) by mouth daily with breakfast., Disp: 90 tablet, Rfl: 3 .  hydrocortisone 2.5 % cream, APPLY TO AFFECTED AREA TWICE A DAY. FOR DERMATITIS ON EARS FOR 2 WEEKS, Disp: , Rfl: 3 .  isosorbide mononitrate (IMDUR) 60 MG 24 hr tablet, TAKE 1 TABLET BY MOUTH EVERY DAY, Disp: 90 tablet, Rfl: 1 .  L-Methylfolate (DEPLIN) 7.5 MG TABS, Take 7.5 mg by mouth daily with breakfast. , Disp: , Rfl:  .  LYRICA 150 MG capsule, TAKE 1 CAPSULE BY MOUTH EVERY 8 HOURS AS DIRECTED, Disp: , Rfl: 2 .  metFORMIN (GLUCOPHAGE) 500 MG tablet, Take 1 tablet (500 mg total) by mouth 2 (two) times daily with a meal., Disp: 180 tablet, Rfl: 1 .  methocarbamol (ROBAXIN) 500 MG tablet, Take 1 tablet (500 mg total) by mouth every 6 (six) hours as needed for muscle spasms. (Patient taking differently: Take 500 mg 3 (three) times daily as needed by mouth for muscle spasms. ), Disp: 60 tablet, Rfl: 0 .  metoprolol tartrate (LOPRESSOR) 50 MG tablet, TAKE 1 TABLET TWICE A DAY, Disp: 180 tablet, Rfl: 1 .  Misc Natural Products (GLUCOS-CHONDROIT-MSM COMPLEX PO), Take 1 tablet 3 (three) times daily by mouth. , Disp: , Rfl:  .  montelukast (SINGULAIR) 10 MG tablet, TAKE 1 TABLET BY MOUTH EVERYDAY AT BEDTIME, Disp: 90 tablet, Rfl: 0 .  Multiple Vitamin (MULITIVITAMIN WITH MINERALS) TABS, Take 1 tablet by mouth daily.  , Disp: , Rfl:  .  nitroGLYCERIN (NITROSTAT) 0.3 MG SL tablet, Place 0.3 mg under the tongue every 5 (five) minutes as needed for chest pain (as needed)., Disp: , Rfl:   .  nystatin cream (MYCOSTATIN), Apply 1 application topically 2 (two) times daily., Disp: 30 g, Rfl: 0 .  ondansetron (ZOFRAN) 4 MG tablet, Take 1 tablet (4 mg total) by mouth every 4 (four) hours as needed for nausea or vomiting., Disp: 30 tablet, Rfl: 11 .  Oxycodone HCl 20 MG TABS, Take 20 mg every 4 (four) hours as needed by mouth (pain). , Disp: , Rfl:  .  OXYGEN, Inhale 2 L into the lungs at bedtime., Disp: , Rfl:  .  potassium chloride (K-DUR) 10 MEQ tablet, TAKE 2 TABLETS BY MOUTH DAILY, Disp: 180 tablet, Rfl: 1 .  PROAIR HFA 108 (90 Base) MCG/ACT inhaler, INHALE 2 PUFFS INTO THE LUNGS EVERY 4 HOURS AS NEEDED FOR SHORTNESS OF BREATH, Disp: 8.5 Inhaler, Rfl: 3 .  promethazine (PHENERGAN) 25 MG tablet, Take 1 tablet (25 mg total) by mouth every 6 (six) hours as needed for nausea or vomiting., Disp: 30 tablet, Rfl: 11 .  sucralfate (CARAFATE) 1 g tablet, Take 1 tablet (1 g total) by mouth 4 (four) times daily -  with meals and at bedtime. Before taking slowly dissolve tablet in 1 Tablespoon of distilled water for about 15 minutes to creat a slurry., Disp: 90 tablet, Rfl: 5  EXAM:  VITALS per patient if applicable: no fever per patient  GENERAL: alert, no audible acute distress  LUNGS: no audible resp distress  MS: reports able to ambulate, able to lift arms above hand, she denies weakness in extremities   PSYCH/NEURO: pleasant and cooperative, no obvious depression or anxiety, speech and thought processing grossly intact  ASSESSMENT AND PLAN:  Discussed the following assessment and plan:  Acute left-sided low back pain with left-sided sciatica  -chronic pain/back issues with worsening symptoms and ? Radicular symptoms -she is frustrated with the pain clinic. She doesn't want more meds - wants "answers" and a different nonmedication approach to treatment -placed referral to ortho -HEP provided -discussed potential likely and serious etiologies, options for eval and treatment,  return precautions  Acute pain of left shoulder -query RTC given aggravating factors -trial HEP given mild with follow up in 3-4 weeks  Type 2 diabetes mellitus without complication, without long-term current use of insulin ([place]) -reviewed labs, implications and treatment options -opted to try increasing metformin a little and work on lifestyle changes   I discussed the assessment and treatment plan with the patient. The patient was provided an opportunity to ask questions and all were answered. The patient agreed with the plan and demonstrated an understanding of the instructions.   The patient was advised to call back or seek an in-person evaluation if the symptoms worsen or if the condition fails to improve as anticipated.  Patient Instructions  -We placed a referral for you as discussed to the specialist regarding your back. It usually takes about 1-2 weeks to process and schedule this referral. If you have not heard from [place] regarding this appointment in 2 weeks please contact our office.  -I asked my assistant, Wendie Simmer, to send you some exercises you can do for your shoulder and back at home. Please let us know if you do not receive them in the next 1 week.  -can use heat and topical menthol (tiger balm) along with treatments from you pain specialist for the pain.  -for the diabetes: Increase the Metformin to 500mg  twice daily Eat a healthy low sugar diet and stay away from sweets, starchy white foods and processed foods. Eat lean proteins such as fish and poultry and at least 5-7 servings of colorful vegetables per day. Avoid potatoes and corn.  -follow up in 4 weeks.  -I hope you are feeling better soon! Seek care promptly if your symptoms worsen, new concerns arise or you are not improving with treatment.     Follow up instructions: Advised assistant Wendie Simmer to help patient arrange the following: -cancel visit with me on the 21st and schedule 1 month follow up with  me -send her the low back and the RTC exercises   Lucretia Kern, DO

## 2019-04-14 NOTE — Telephone Encounter (Signed)
-----   Message from Lucretia Kern, DO sent at 04/14/2019  2:14 PM EDT ----- Follow up instructions: Advised assistant Wendie Simmer to help patient arrange the following: -cancel visit with me on the 21st and schedule 1 month follow up with me -send her the low back and the RTC exercises -glucose monitor

## 2019-04-22 ENCOUNTER — Ambulatory Visit: Payer: Self-pay

## 2019-04-22 ENCOUNTER — Ambulatory Visit: Payer: Medicare Other | Admitting: Orthopaedic Surgery

## 2019-04-22 ENCOUNTER — Encounter: Payer: Self-pay | Admitting: Orthopaedic Surgery

## 2019-04-22 ENCOUNTER — Other Ambulatory Visit: Payer: Self-pay

## 2019-04-22 VITALS — Ht 66.0 in | Wt 230.0 lb

## 2019-04-22 DIAGNOSIS — M542 Cervicalgia: Secondary | ICD-10-CM | POA: Diagnosis not present

## 2019-04-22 DIAGNOSIS — G8929 Other chronic pain: Secondary | ICD-10-CM

## 2019-04-22 DIAGNOSIS — M545 Low back pain: Secondary | ICD-10-CM

## 2019-04-22 NOTE — Progress Notes (Signed)
Office Visit Note   Patient: Shelby Anderson           Date of Birth: 12/07/1955           MRN: [id number] Visit Date: 04/22/2019              Requested by: Lucretia Kern, DO [address] [place], [place] [address] PCP: Lucretia Kern, DO   Assessment & Plan: Visit Diagnoses:  1. Neck pain   2. Chronic left-sided low back pain, unspecified whether sciatica present     Plan: We discussed continued weight loss. I be happy to make physical therapy referral for her if she so desires she like to wait on this currently.  I reviewed the previous MRI scan with patient as well as plain radiographs.  She has facet arthropathy lumbar spine without evidence of neuropathy on current exam today.  She can return if she develops progressive symptoms of either claudication or radiculopathy.   Follow-Up Instructions: Return if symptoms worsen or fail to improve.   Orders:  Orders Placed This Encounter  Procedures   XR Cervical Spine 2 or 3 views   XR Lumbar Spine 2-3 Views   No orders of the defined types were placed in this encounter.     Procedures: No procedures performed   Clinical Data: No additional findings.   Subjective: Chief Complaint  Patient presents with   Neck - Pain    Fell 03/31/2019   Left Shoulder - Pain    Fell 03/31/2019   Lower Back - Pain    Fell 03/31/2019   Left Hip - Pain    [place] 03/31/2019    HPI 63 year old female with chronic back pain and pain management on oxycodone 20 mg 4 times a day by Dr. [name].  She states she has been seen and followed up when she saw other surgeons and feels like she was only treated with medication.  She fell down the steps landing on her back and had pain in her shoulder she states she did fine for 3 days and then had increased problems getting up and down with pain in her back and her left hip and left leg.  Increased pain in her left shoulder difficulty reaching above her head washing her hair getting  dressed.  She states at times with forward flexion she is felt lightheaded.  No associated bowel or bladder symptoms.  Previous lumbar MRI scan showed broad-based disc bulge at L4-5 and facet arthropathy moderate on the right mild on the left in 2016.  Patient also had lumbar myelogram CT scan similar findings 2012.  Positive for fall 03/31/2019.  Review of Systems 14 point review of systems positive for coronary artery disease, sleep apnea, hypertension associated with diabetes, GERD, chronic back pain, neuropathy, chronic narcotic medication for pain management.,  Previous left total knee arthroplasty doing well, cardiomyopathy, history of PVCs, positive obesity, syncope.   Objective: Vital Signs: Ht 5\' 6"  (1.676 m)    Wt 230 lb (104.3 kg)    BMI 37.12 kg/m   Physical Exam Constitutional:      Appearance: She is well-developed.  HENT:     Head: Normocephalic.     Right Ear: External ear normal.     Left Ear: External ear normal.  Eyes:     Pupils: Pupils are equal, round, and reactive to light.  Neck:     Thyroid: No thyromegaly.     Trachea: No tracheal deviation.  Cardiovascular:  Rate and Rhythm: Normal rate.  Pulmonary:     Effort: Pulmonary effort is normal.  Abdominal:     Palpations: Abdomen is soft.  Skin:    General: Skin is warm and dry.  Neurological:     Mental Status: She is alert and oriented to person, place, and time.  Psychiatric:        Behavior: Behavior normal.     Ortho Exam patient has negative logroll to the hips negative straight leg raising knee and ankle jerk are intact upper extremity reflexes are intact she has pain with forward flexion chin to finger breath the chest.  Some brachial plexus tenderness more on the right than the left mild positive impingement of her shoulder negative drop arm test long head of the biceps minimally tender no subluxation of the shoulder.  Acromioclavicular joint is normal.  No supraclavicular lymphadenopathy.  Biceps  triceps wrist flexion extension pronation supination is intact.  She can heel and toe walk and has mild balance problems may be related to her medication.  Specialty Comments:  No specialty comments available.  Imaging: CLINICAL DATA:  Low back pain with bilateral leg pain and burning in both feet for 6 months  EXAM: MRI LUMBAR SPINE WITHOUT CONTRAST  TECHNIQUE: Multiplanar, multisequence MR imaging of the lumbar spine was performed. No intravenous contrast was administered.  COMPARISON:  None.  FINDINGS: The vertebral bodies of the lumbar spine are normal in size. The vertebral bodies of the lumbar spine are normal in alignment. There is normal bone marrow signal demonstrated throughout the vertebra. Mild disc desiccation and disc height loss at L5-S1.  The spinal cord is normal in signal and contour. The cord terminates normally at L1 . The nerve roots of the cauda equina and the filum terminale are normal.  The visualized portions of the SI joints are unremarkable.  The imaged intra-abdominal contents are unremarkable.  T12-L1: No significant disc bulge. No evidence of neural foraminal stenosis. No central canal stenosis.  L1-L2: No significant disc bulge. No evidence of neural foraminal stenosis. No central canal stenosis.  L2-L3: No significant disc bulge. No evidence of neural foraminal stenosis. No central canal stenosis.  L3-L4: No significant disc bulge. No evidence of neural foraminal stenosis. No central canal stenosis.  L4-L5: Mild broad-based disc bulge. Mild bilateral facet arthropathy. No evidence of neural foraminal stenosis. No central canal stenosis.  L5-S1: No significant disc bulge. No evidence of neural foraminal stenosis. No central canal stenosis. Moderate right and mild left facet arthropathy.  IMPRESSION: 1. At L4-5 there is a mild broad-based disc bulge with mild bilateral facet arthropathy. 2. At L5-S1 there is moderate  right and mild left facet arthropathy.   Electronically Signed   By: Kathreen Devoid   On: 04/26/2015 14:49    PMFS History: Patient Active Problem List   Diagnosis Date Noted   Snoring 07/16/2018   Encounter for loop recorder at end of battery life 04/24/2018   Esophageal stricture 07/01/2017   Hyperlipidemia associated with type 2 diabetes mellitus ([place]) 05/20/2017   Allergic rhinitis 03/11/2017   Dilated cardiomyopathy ([place]) 02/07/2017   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 11/13/2016   Morbid obesity due to excess calories ([place]) 09/26/2016   S/P left TKA 09/25/2016   Increased endometrial stripe thickness 06/27/2016   Intramural leiomyoma of uterus 06/27/2016   Type 2 diabetes mellitus without complication, without long-term current use of insulin ([place]) 12/06/2015   Chronic respiratory failure ([place]) 09/15/2015   Dyspnea  09/01/2015   Hypertension associated with diabetes ([place]) 07/19/2015   Arrhythmia 07/19/2015   Orthostatic hypotension 07/19/2015   GERD (gastroesophageal reflux disease) 07/19/2015   Chronic back pain 07/19/2015   Neuropathy 07/19/2015   Symptomatic PVCs 11/02/2014   Syncope 11/02/2014   Chest pain 12/23/2013   Disorder of cervix 03/10/2013   Vaginal atrophy 03/10/2013   OSA (obstructive sleep apnea) 07/31/2012   Coronary artery disease 11/20/2011   Past Medical History:  Diagnosis Date   Allergy    Anemia    "chronic"   Angina    Anxiety and depression    Arthritis    Asthma    Atrial fibrillation ([place])    h/o "AF w/frequent PVCs"   Cancer ([place])    hx of skin cancer    CHF (congestive heart failure) (HCC)    Chronic back pain greater than 3 months duration    on chronic narcotics, treated at pain clinic   Colon polyps    hyperplastic   Coronary artery disease    Arrythmia, orthostatic hypotension, HLD, HTN; sees Dr. Einar Gip   Difficult intubation    "TMJ & woke up when they were still  cutting on me"   Dysrhythmia    sees Dr. Einar Gip and a cardiologist at [facility]   Esophageal stricture    Fatty liver    Fibroids    Fibromyalgia    "in my legs"   GERD (gastroesophageal reflux disease)    hx hiatal hernia, stricture and gastric ulcer   Headache(784.0)    Heart murmur    Hiatal hernia    History of loop recorder    History of migraines    "dx'd when I was in my teens"   Hyperlipemia    Hypertension    Mental disorder    Mild episode of recurrent major depressive disorder ([place]) 12/06/2015   Myocardial infarction [facility]) 1980's & 1990;   sees Dr. Einar Gip   OSA (obstructive sleep apnea)    Pneumonia    PONV (postoperative nausea and vomiting)    Recurrent upper respiratory infection (URI)    Shortness of breath 11/20/11   "all the time", sees pulmonlogy, ? asthma   Stenotic cervical os    Stomach ulcer    "3 small; found in 05/2011"   TMJ (dislocation of temporomandibular joint)    Tuberculosis    + TB SKIN TEST   Type 2 diabetes mellitus without complication, without long-term current use of insulin ([place]) 12/06/2015   not on meds     Family History  Problem Relation Age of Onset   Malignant hyperthermia Father    Hypertension Father    Heart disease Father    Diabetes Father    Cancer Father        skin   Hypertension Mother    Heart disease Mother    Cancer Sister        CERVICAL   Hypertension Sister    Cancer Brother        MELANOMA   Heart disease Maternal Grandmother    Heart disease Maternal Grandfather    Cancer Paternal Grandmother        ?    Heart disease Paternal Grandmother    Heart disease Paternal Grandfather    Cancer Brother        LUNG   Diabetes Sister    Hypertension Sister    Heart disease Sister    Cancer Sister    Cancer Brother    Anesthesia  problems Neg Hx    Hypotension Neg Hx    Pseudochol deficiency Neg Hx    Colon cancer Neg Hx    Esophageal cancer Neg Hx     Pancreatic cancer Neg Hx    Rectal cancer Neg Hx    Stomach cancer Neg Hx    Breast cancer Neg Hx     Past Surgical History:  Procedure Laterality Date   ACHILLES TENDON REPAIR  1970's   left ankle   ARTHROSCOPIC REPAIR ACL     left knee cap   BREAST BIOPSY Right 04/08/2013   CARDIAC CATHETERIZATION     loop recorder   CARPAL TUNNEL RELEASE  unknown   left hand   ESOPHAGOGASTRODUODENOSCOPY (EGD) WITH PROPOFOL N/A 10/29/2017   Procedure: ESOPHAGOGASTRODUODENOSCOPY (EGD) WITH PROPOFOL;  Surgeon: Irene Shipper, MD;  [facility];  Service: Endoscopy;  Laterality: N/A;   LOOP RECORDER IMPLANT     post ganglionectomy  1970's   "for migraine headaches"   pouch string  79,81,83   "did this 3 times (once w/each pregnancy)"   SAVORY DILATION N/A 10/29/2017   Procedure: SAVORY DILATION;  Surgeon: Irene Shipper, MD;  [facility]: [facility];  Service: Endoscopy;  Laterality: N/A;   TOTAL KNEE ARTHROPLASTY Left 09/25/2016   Procedure: LEFT TOTAL KNEE ARTHROPLASTY;  Surgeon: Paralee Cancel, MD;  Location: [facility];  Service: Orthopedics;  Laterality: Left;   TUBAL LIGATION  1980's   Social History   Occupational History   Occupation: Retired Therapist, sports  Tobacco Use   Smoking status: Never Smoker   Smokeless tobacco: Never Used  Substance and Sexual Activity   Alcohol use: No    Alcohol/week: 0.0 standard drinks   Drug use: No   Sexual activity: Not Currently    Birth control/protection: Surgical    Comment: 1st intercourse 63 yo-Fewer than 5 partners

## 2019-04-23 ENCOUNTER — Ambulatory Visit: Payer: Medicare Other | Admitting: Family Medicine

## 2019-04-23 DIAGNOSIS — J189 Pneumonia, unspecified organism: Secondary | ICD-10-CM | POA: Diagnosis not present

## 2019-04-23 DIAGNOSIS — R0902 Hypoxemia: Secondary | ICD-10-CM | POA: Diagnosis not present

## 2019-04-23 DIAGNOSIS — J4540 Moderate persistent asthma, uncomplicated: Secondary | ICD-10-CM | POA: Diagnosis not present

## 2019-04-23 DIAGNOSIS — J45909 Unspecified asthma, uncomplicated: Secondary | ICD-10-CM | POA: Diagnosis not present

## 2019-04-24 DIAGNOSIS — G4700 Insomnia, unspecified: Secondary | ICD-10-CM | POA: Diagnosis not present

## 2019-04-24 DIAGNOSIS — Z79891 Long term (current) use of opiate analgesic: Secondary | ICD-10-CM | POA: Diagnosis not present

## 2019-04-24 DIAGNOSIS — S9422XS Injury of deep peroneal nerve at ankle and foot level, left leg, sequela: Secondary | ICD-10-CM | POA: Diagnosis not present

## 2019-04-24 DIAGNOSIS — M47816 Spondylosis without myelopathy or radiculopathy, lumbar region: Secondary | ICD-10-CM | POA: Diagnosis not present

## 2019-04-24 DIAGNOSIS — G894 Chronic pain syndrome: Secondary | ICD-10-CM | POA: Diagnosis not present

## 2019-04-28 ENCOUNTER — Encounter: Payer: Self-pay | Admitting: Orthopaedic Surgery

## 2019-05-02 ENCOUNTER — Telehealth: Payer: Self-pay | Admitting: Internal Medicine

## 2019-05-02 DIAGNOSIS — Z20822 Contact with and (suspected) exposure to covid-19: Secondary | ICD-10-CM

## 2019-05-02 NOTE — Telephone Encounter (Signed)
RN spoke with the patient.  Explained that the patient had been possibly exposed to the Covid-19 virus at her recent visit to [facility] on 04/22/19.  Offered the free Covid-19 screening for today or tomorrow.  Patient to come Sunday, May 31st at 1500 at the [facility] location.

## 2019-05-03 ENCOUNTER — Other Ambulatory Visit: Payer: Medicare Other

## 2019-05-04 LAB — NOVEL CORONAVIRUS, NAA: SARS-CoV-2, NAA: NOT DETECTED

## 2019-05-13 DIAGNOSIS — J189 Pneumonia, unspecified organism: Secondary | ICD-10-CM | POA: Diagnosis not present

## 2019-05-13 DIAGNOSIS — R0902 Hypoxemia: Secondary | ICD-10-CM | POA: Diagnosis not present

## 2019-05-13 DIAGNOSIS — G4733 Obstructive sleep apnea (adult) (pediatric): Secondary | ICD-10-CM | POA: Diagnosis not present

## 2019-05-13 DIAGNOSIS — J45909 Unspecified asthma, uncomplicated: Secondary | ICD-10-CM | POA: Diagnosis not present

## 2019-05-18 ENCOUNTER — Ambulatory Visit: Payer: Medicare Other | Admitting: Podiatry

## 2019-05-19 ENCOUNTER — Other Ambulatory Visit: Payer: Self-pay

## 2019-05-19 ENCOUNTER — Ambulatory Visit (INDEPENDENT_AMBULATORY_CARE_PROVIDER_SITE_OTHER): Payer: Medicare Other | Admitting: Family Medicine

## 2019-05-19 DIAGNOSIS — R42 Dizziness and giddiness: Secondary | ICD-10-CM | POA: Diagnosis not present

## 2019-05-19 DIAGNOSIS — M25512 Pain in left shoulder: Secondary | ICD-10-CM | POA: Diagnosis not present

## 2019-05-19 NOTE — Progress Notes (Signed)
Virtual Visit via Telephone Note  I connected with Shelby Anderson on 05/19/19 at  3:00 PM EDT by telephone and verified that I am speaking with the correct person using two identifiers.  Location patient: home Location provider: work or home office Participants present for the call: patient, provider Patient did not have a visit in the prior 7 days to address this/these issue(s).   History of Present Illness:   Follow up shoulder pain: -doing the home exercises 4-5 days per week -reports has improved significantly -still can feel it in certain lying positions, but otherwise doing much better  New complaint of vertigo: -started a few month ago, but worsened after fall in May -only occurs with flexion of the head, immediate vertigo and nausea -reports resolves quickly once head is in neutral position again -reports saw ortho about this, but they did not offer any suggestions and told her the neck looked good -denies: headache, weakness, numbness, worsening, symptoms at other times or with turning head any other direction    Observations/Objective: Patient sounds cheerful and well on the phone. I do not appreciate any SOB. Speech and thought processing are grossly intact. Patient reported vitals:none -denies symptoms with turning head to L, R or with extension or sidebending of the head and neck  Assessment and Plan:  Shoulder pain - almost resolved, advised continued home exercises and follow up if worsens or persist.  Vertigo -we discussed possible serious and likely etiologies, workup and treatment, treatment risks and return precautions -after this discussion, Shelby Anderson opted for evaluation with neurologist. Referral placed. She is not driving and does not intend to. -of course, we advised Shelby Anderson  to return or notify a doctor immediately if symptoms worsen or persist or new concerns arise.  Follow Up Instructions:   I did not refer this patient for an OV in the next 24  hours for this/these issue(s).  I discussed the assessment and treatment plan with the patient. The patient was provided an opportunity to ask questions and all were answered. The patient agreed with the plan and demonstrated an understanding of the instructions.   The patient was advised to call back or seek an in-person evaluation if the symptoms worsen or if the condition fails to improve as anticipated.  I provided 11 minutes of non-face-to-face time during this encounter.   Follow up instructions: Advised assistant Wendie Simmer to help patient arrange the following: -schedule follow up with Dr. [name] in November -keep appt with Dr. Marijo Sanes, DO

## 2019-05-20 ENCOUNTER — Telehealth: Payer: Self-pay | Admitting: *Deleted

## 2019-05-20 NOTE — Telephone Encounter (Signed)
Per office notes from 6/16, I called the pt and offered to schedule a phone visit in November and the pt stated she will call back for this appt.  Patient reminded to keep the appt in August with Dr Ethlyn Gallery.

## 2019-05-22 ENCOUNTER — Other Ambulatory Visit: Payer: Self-pay | Admitting: Pulmonary Disease

## 2019-05-22 DIAGNOSIS — S9422XS Injury of deep peroneal nerve at ankle and foot level, left leg, sequela: Secondary | ICD-10-CM | POA: Diagnosis not present

## 2019-05-22 DIAGNOSIS — M47816 Spondylosis without myelopathy or radiculopathy, lumbar region: Secondary | ICD-10-CM | POA: Diagnosis not present

## 2019-05-22 DIAGNOSIS — Z79891 Long term (current) use of opiate analgesic: Secondary | ICD-10-CM | POA: Diagnosis not present

## 2019-05-22 DIAGNOSIS — G894 Chronic pain syndrome: Secondary | ICD-10-CM | POA: Diagnosis not present

## 2019-05-24 DIAGNOSIS — J189 Pneumonia, unspecified organism: Secondary | ICD-10-CM | POA: Diagnosis not present

## 2019-05-24 DIAGNOSIS — J4540 Moderate persistent asthma, uncomplicated: Secondary | ICD-10-CM | POA: Diagnosis not present

## 2019-05-24 DIAGNOSIS — R0902 Hypoxemia: Secondary | ICD-10-CM | POA: Diagnosis not present

## 2019-05-24 DIAGNOSIS — J45909 Unspecified asthma, uncomplicated: Secondary | ICD-10-CM | POA: Diagnosis not present

## 2019-06-09 DIAGNOSIS — G4733 Obstructive sleep apnea (adult) (pediatric): Secondary | ICD-10-CM | POA: Diagnosis not present

## 2019-06-12 DIAGNOSIS — G4733 Obstructive sleep apnea (adult) (pediatric): Secondary | ICD-10-CM | POA: Diagnosis not present

## 2019-06-12 DIAGNOSIS — J189 Pneumonia, unspecified organism: Secondary | ICD-10-CM | POA: Diagnosis not present

## 2019-06-12 DIAGNOSIS — R0902 Hypoxemia: Secondary | ICD-10-CM | POA: Diagnosis not present

## 2019-06-12 DIAGNOSIS — J45909 Unspecified asthma, uncomplicated: Secondary | ICD-10-CM | POA: Diagnosis not present

## 2019-06-15 ENCOUNTER — Telehealth: Payer: Self-pay | Admitting: Pulmonary Disease

## 2019-06-15 NOTE — Telephone Encounter (Signed)
LMTCB. No form has been received as of yet. They will need to re-fax form.

## 2019-06-16 NOTE — Telephone Encounter (Signed)
Shelby Anderson, please advise if you have received a form in regards to cpap supplies for pt from [employer]. Thanks!

## 2019-06-16 NOTE — Telephone Encounter (Signed)
I don't have any forms for this patient but I am not getting a lot of my forms

## 2019-06-17 NOTE — Telephone Encounter (Signed)
Shelby Anderson, please advise if a form has been received by [employer] in regards to pt cpap supplies. Thanks!

## 2019-06-17 NOTE — Telephone Encounter (Signed)
I have not seen any forms from [employer] on this patient.

## 2019-06-18 ENCOUNTER — Other Ambulatory Visit: Payer: Self-pay | Admitting: Family Medicine

## 2019-06-18 NOTE — Telephone Encounter (Signed)
Attempted to call [employer] but unable to reach. Left detailed message for [employer] to return call.

## 2019-06-19 ENCOUNTER — Other Ambulatory Visit: Payer: Self-pay

## 2019-06-19 ENCOUNTER — Ambulatory Visit (INDEPENDENT_AMBULATORY_CARE_PROVIDER_SITE_OTHER): Payer: Medicare Other | Admitting: Podiatry

## 2019-06-19 ENCOUNTER — Encounter: Payer: Self-pay | Admitting: Podiatry

## 2019-06-19 ENCOUNTER — Other Ambulatory Visit: Payer: Self-pay | Admitting: Family Medicine

## 2019-06-19 DIAGNOSIS — G894 Chronic pain syndrome: Secondary | ICD-10-CM | POA: Diagnosis not present

## 2019-06-19 DIAGNOSIS — B351 Tinea unguium: Secondary | ICD-10-CM

## 2019-06-19 DIAGNOSIS — M47816 Spondylosis without myelopathy or radiculopathy, lumbar region: Secondary | ICD-10-CM | POA: Diagnosis not present

## 2019-06-19 DIAGNOSIS — E1151 Type 2 diabetes mellitus with diabetic peripheral angiopathy without gangrene: Secondary | ICD-10-CM

## 2019-06-19 DIAGNOSIS — Z79891 Long term (current) use of opiate analgesic: Secondary | ICD-10-CM | POA: Diagnosis not present

## 2019-06-19 DIAGNOSIS — S9422XS Injury of deep peroneal nerve at ankle and foot level, left leg, sequela: Secondary | ICD-10-CM | POA: Diagnosis not present

## 2019-06-19 NOTE — Telephone Encounter (Signed)
lmtcb for [employer] asking them to refax and will close encounter as I left a very detailed msg

## 2019-06-19 NOTE — Patient Instructions (Signed)

## 2019-06-22 ENCOUNTER — Other Ambulatory Visit: Payer: Self-pay

## 2019-06-22 MED ORDER — SUCRALFATE 1 G PO TABS: 1.0000 g | ORAL_TABLET | Freq: Three times a day (TID) | ORAL | 1 refills | Status: DC

## 2019-06-22 NOTE — Telephone Encounter (Signed)
Pt is checking on status of rx  Please call when sending to pharm

## 2019-06-23 DIAGNOSIS — R0902 Hypoxemia: Secondary | ICD-10-CM | POA: Diagnosis not present

## 2019-06-23 DIAGNOSIS — J4540 Moderate persistent asthma, uncomplicated: Secondary | ICD-10-CM | POA: Diagnosis not present

## 2019-06-23 DIAGNOSIS — J189 Pneumonia, unspecified organism: Secondary | ICD-10-CM | POA: Diagnosis not present

## 2019-06-23 DIAGNOSIS — J45909 Unspecified asthma, uncomplicated: Secondary | ICD-10-CM | POA: Diagnosis not present

## 2019-06-24 ENCOUNTER — Telehealth: Payer: Self-pay | Admitting: Family Medicine

## 2019-06-24 NOTE — Progress Notes (Signed)
Subjective: Shelby Anderson is a 63 y.o. y.o. female who presents for preventative foot care today with h/o NIIDM PAD and cc of painful, discolored, thick toenails and painful callus/corn which interfere with daily activities. Pain is aggravated when wearing enclosed shoe gear. Pain is relieved with periodic professional debridement.  Lucretia Kern, DO is her PCP.    Current Outpatient Medications:  .  albuterol (PROVENTIL) (2.5 MG/3ML) 0.083% nebulizer solution, Take 3 mLs (2.5 mg total) by nebulization every 6 (six) hours as needed for wheezing or shortness of breath (J45.40)., Disp: 75 mL, Rfl: 3 .  aspirin EC 81 MG tablet, Take 81 mg at bedtime by mouth. , Disp: , Rfl:  .  atorvastatin (LIPITOR) 40 MG tablet, TAKE 1 TABLET BY MOUTH EVERY DAY, Disp: 90 tablet, Rfl: 1 .  budesonide-formoterol (SYMBICORT) 80-4.5 MCG/ACT inhaler, INHALE 2 PUFFS INTO THE LUNGS EVERY MORNING AND ANOTHER 2 PUFFS 12 HOURS LATER, Disp: 30.6 Inhaler, Rfl: 1 .  CINNAMON PO, Take 1,000 mg 2 (two) times daily by mouth., Disp: , Rfl:  .  Cyanocobalamin (VITAMIN B-12) 5000 MCG TBDP, Take 5,000 mcg by mouth daily. , Disp: , Rfl:  .  doxepin (SINEQUAN) 10 MG capsule, TAKE 1 CAPSULE BY MOUTH EVERYDAY AT BEDTIME, Disp: , Rfl:  .  esomeprazole (NEXIUM) 40 MG capsule, Take 1 capsule (40 mg total) by mouth 2 (two) times daily., Disp: 60 capsule, Rfl: 11 .  fluticasone (FLONASE) 50 MCG/ACT nasal spray, Place 2 sprays at bedtime as needed into both nostrils for allergies or rhinitis. , Disp: , Rfl:  .  glipiZIDE (GLUCOTROL XL) 5 MG 24 hr tablet, TAKE 1 TABLET BY MOUTH EVERY DAY WITH BREAKFAST, Disp: 90 tablet, Rfl: 3 .  hydrocortisone 2.5 % cream, APPLY TO AFFECTED AREA TWICE A DAY. FOR DERMATITIS ON EARS FOR 2 WEEKS, Disp: , Rfl: 3 .  isosorbide mononitrate (IMDUR) 60 MG 24 hr tablet, TAKE 1 TABLET BY MOUTH EVERY DAY, Disp: 90 tablet, Rfl: 1 .  L-Methylfolate (DEPLIN) 7.5 MG TABS, Take 7.5 mg by mouth daily with breakfast. ,  Disp: , Rfl:  .  LYRICA 150 MG capsule, TAKE 1 CAPSULE BY MOUTH EVERY 8 HOURS AS DIRECTED, Disp: , Rfl: 2 .  metFORMIN (GLUCOPHAGE) 500 MG tablet, Take 1 tablet (500 mg total) by mouth 2 (two) times daily with a meal., Disp: 180 tablet, Rfl: 1 .  methocarbamol (ROBAXIN) 500 MG tablet, Take 1 tablet (500 mg total) by mouth every 6 (six) hours as needed for muscle spasms. (Patient taking differently: Take 500 mg 3 (three) times daily as needed by mouth for muscle spasms. ), Disp: 60 tablet, Rfl: 0 .  metoprolol tartrate (LOPRESSOR) 50 MG tablet, TAKE 1 TABLET TWICE A DAY, Disp: 180 tablet, Rfl: 1 .  Misc Natural Products (GLUCOS-CHONDROIT-MSM COMPLEX PO), Take 1 tablet 3 (three) times daily by mouth. , Disp: , Rfl:  .  mometasone (ELOCON) 0.1 % cream, APPLY TO AFFECTED AREA TWICE A DAY, Disp: , Rfl:  .  montelukast (SINGULAIR) 10 MG tablet, TAKE 1 TABLET BY MOUTH EVERYDAY AT BEDTIME, Disp: 30 tablet, Rfl: 2 .  Multiple Vitamin (MULITIVITAMIN WITH MINERALS) TABS, Take 1 tablet by mouth daily.  , Disp: , Rfl:  .  nitroGLYCERIN (NITROSTAT) 0.3 MG SL tablet, Place 0.3 mg under the tongue every 5 (five) minutes as needed for chest pain (as needed)., Disp: , Rfl:  .  nystatin cream (MYCOSTATIN), Apply 1 application topically 2 (two) times daily., Disp: 30  g, Rfl: 0 .  ondansetron (ZOFRAN) 4 MG tablet, Take 1 tablet (4 mg total) by mouth every 4 (four) hours as needed for nausea or vomiting., Disp: 30 tablet, Rfl: 11 .  Oxycodone HCl 20 MG TABS, Take 20 mg every 4 (four) hours as needed by mouth (pain). , Disp: , Rfl:  .  OXYGEN, Inhale 2 L into the lungs at bedtime., Disp: , Rfl:  .  potassium chloride (K-DUR) 10 MEQ tablet, TAKE 2 TABLETS BY MOUTH DAILY, Disp: 180 tablet, Rfl: 1 .  PROAIR HFA 108 (90 Base) MCG/ACT inhaler, INHALE 2 PUFFS INTO THE LUNGS EVERY 4 HOURS AS NEEDED FOR SHORTNESS OF BREATH, Disp: 8.5 Inhaler, Rfl: 3 .  promethazine (PHENERGAN) 25 MG tablet, Take 1 tablet (25 mg total) by mouth  every 6 (six) hours as needed for nausea or vomiting., Disp: 30 tablet, Rfl: 11 .  sucralfate (CARAFATE) 1 g tablet, Take 1 tablet (1 g total) by mouth 4 (four) times daily -  with meals and at bedtime. Before taking slowly dissolve tablet in 1 Tablespoon of distilled water for about 15 minutes to creat a slurry.  Patient needs office visit for further refills, Disp: 90 tablet, Rfl: 1  Allergies  Allergen Reactions  . Lodine [Etodolac] Anaphylaxis, Hives and Swelling  . Oxycontin [Oxycodone Hcl] Anaphylaxis    hives, trouble breathing, tongue swelling (Only Oxycontin) Tolerates plain oxycodone.  Marland Kitchen Penicillins Anaphylaxis    Told by a surgeon never to take it again. Has patient had a PCN reaction causing immediate rash, facial/tongue/throat swelling, SOB or lightheadedness with hypotension: Yes Has patient had a PCN reaction causing severe rash involving mucus membranes or skin necrosis: Unknown Has patient had a PCN reaction that required hospitalization: No Has patient had a PCN reaction occurring within the last 10 years: No If all of the above answers are "NO", then may proceed with Cephalosporin use.  . Aspirin Other (See Comments)    High-dose caused GI Bleeds  . Darvocet [Propoxyphene N-Acetaminophen] Hives  . Nitroglycerin     IV-BP drops dramatically  . Tramadol Other (See Comments)    UNKNOWN  . Ultram [Tramadol Hcl] Hives  . Valium Other (See Comments)    Circulation problems. "Legs turned black".    Objective: There were no vitals filed for this visit.  Vascular Examination: Capillary refill time immediates x 10 digits.  Dorsalis pedis pulses 1/4 b/l.  Posterior tibial pulses absent b/l.  No digital hair x 10 digits.  Skin temperature WNL b/l.  Dermatological Examination: Skin with normal turgor, texture and tone b/l.  Toenails 1-5 b/l discolored, thick, dystrophic with subungual debris and pain with palpation to nailbeds due to thickness of nails.  No open  wounds b/l.  Musculoskeletal: Muscle strength 5/5 to all LE muscle groups.  Neurological: Sensation intact 5/5 b/l with 10 gram monofilament.  Vibratory sensation intact b/l.  Assessment: 1. Painful onychomycosis toenails 1-5 b/l 2. NIDDM with Peripheral arterial disease  Plan: 1. Continue diabetic foot care principles. Literature dispensed. 2. Toenails 1-5 b/l were debrided in length and girth without iatrogenic bleeding. 3. Patient to continue soft, supportive shoe gear daily. 4. Patient to report any pedal injuries to medical professional immediately. 5. Follow up 3 months.  6. Patient/POA to call should there be a concern in the interim.

## 2019-06-24 NOTE — Telephone Encounter (Signed)
atorvastatin (LIPITOR) 40 MG tablet   Send to [facility]

## 2019-06-25 NOTE — Telephone Encounter (Signed)
Rx done. 

## 2019-07-13 DIAGNOSIS — G4733 Obstructive sleep apnea (adult) (pediatric): Secondary | ICD-10-CM | POA: Diagnosis not present

## 2019-07-13 DIAGNOSIS — J45909 Unspecified asthma, uncomplicated: Secondary | ICD-10-CM | POA: Diagnosis not present

## 2019-07-13 DIAGNOSIS — R0902 Hypoxemia: Secondary | ICD-10-CM | POA: Diagnosis not present

## 2019-07-13 DIAGNOSIS — J189 Pneumonia, unspecified organism: Secondary | ICD-10-CM | POA: Diagnosis not present

## 2019-07-14 ENCOUNTER — Telehealth: Payer: Self-pay | Admitting: Internal Medicine

## 2019-07-14 ENCOUNTER — Other Ambulatory Visit: Payer: Self-pay | Admitting: Family Medicine

## 2019-07-14 DIAGNOSIS — Z1231 Encounter for screening mammogram for malignant neoplasm of breast: Secondary | ICD-10-CM

## 2019-07-15 ENCOUNTER — Telehealth: Payer: Self-pay | Admitting: Pulmonary Disease

## 2019-07-15 ENCOUNTER — Encounter: Payer: Medicare Other | Admitting: Family Medicine

## 2019-07-15 DIAGNOSIS — G894 Chronic pain syndrome: Secondary | ICD-10-CM | POA: Diagnosis not present

## 2019-07-15 DIAGNOSIS — S9422XS Injury of deep peroneal nerve at ankle and foot level, left leg, sequela: Secondary | ICD-10-CM | POA: Diagnosis not present

## 2019-07-15 DIAGNOSIS — M47816 Spondylosis without myelopathy or radiculopathy, lumbar region: Secondary | ICD-10-CM | POA: Diagnosis not present

## 2019-07-15 DIAGNOSIS — Z79891 Long term (current) use of opiate analgesic: Secondary | ICD-10-CM | POA: Diagnosis not present

## 2019-07-15 NOTE — Telephone Encounter (Signed)
This is Dr. Bari Mantis Patient and she was last seen 12/15/2018 and was to return in 4 months. [employer] keeps trying to get the form signed for cpap supplies but Dr. Elsworth Soho will not sign the form until she has ROV. [employer] was originally sending to Dr. Ander Slade but he only read the cpap titration study done. They are sending the order through [facility] which I have access to. No paper forms have been sent that I am aware of

## 2019-07-16 ENCOUNTER — Other Ambulatory Visit: Payer: Medicare Other

## 2019-07-16 ENCOUNTER — Other Ambulatory Visit: Payer: Self-pay

## 2019-07-16 ENCOUNTER — Encounter: Payer: Self-pay | Admitting: Pulmonary Disease

## 2019-07-16 ENCOUNTER — Ambulatory Visit (INDEPENDENT_AMBULATORY_CARE_PROVIDER_SITE_OTHER): Payer: Medicare Other | Admitting: Pulmonary Disease

## 2019-07-16 DIAGNOSIS — J45991 Cough variant asthma: Secondary | ICD-10-CM

## 2019-07-16 MED ORDER — PROMETHAZINE HCL 25 MG PO TABS: 25.0000 mg | ORAL_TABLET | Freq: Four times a day (QID) | ORAL | 1 refills | Status: DC | PRN

## 2019-07-16 MED ORDER — PREDNISONE 10 MG PO TABS: ORAL_TABLET | ORAL | 0 refills | Status: DC

## 2019-07-16 NOTE — Assessment & Plan Note (Signed)
CPAP is working well on 10 cm with good control of events and excellent compliance Her reflux symptoms have also decreased  Weight loss encouraged, compliance with goal of at least 4-6 hrs every night is the expectation. Advised against medications with sedative side effects Cautioned against driving when sleepy - understanding that sleepiness will vary on a day to day basis

## 2019-07-16 NOTE — Patient Instructions (Signed)
  CPAP is working well at 10 cm. Try using albuterol neb daily around 7 PM If not improved, start taking prednisone Prednisone 10 mg tabs  Take 2 tabs daily with food x 5ds, then 1 tab daily with food x 5ds then STOP

## 2019-07-16 NOTE — Assessment & Plan Note (Signed)
Continue Symbicort and Singulair No clear trigger for current flare  Try using albuterol neb daily around 7 PM If not improved, start taking prednisone Prednisone 10 mg tabs  Take 2 tabs daily with food x 5ds, then 1 tab daily with food x 5ds then STOP

## 2019-07-16 NOTE — Progress Notes (Signed)
   Subjective:    Patient ID: Shelby Anderson, female    DOB: 1956/01/01, 63 y.o.   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 PMH -syncope &has a loop recorder - brady &int a - fibn ,pulm hypertension She has positive PPD  Chief Complaint  Patient presents with  . Follow-up    Patient reports that she has had sob and wheezing. She reports that she is doing well with her CPAP machine with no complaints.   She reports increase shortness of breath due to the heat and especially when she is outside.  There is occasional wheezing at night, she has not needed albuterol nebulizer much. She is compliant with Symbicort and Singulair. Reflux symptoms have decreased since she started using CPAP, she is compliant with her Nexium and Carafate. Orthopnea is decreased and she now only sleeps on 2 pillows where earlier she would need 5 pillows. She is social distancing and is compliant with mask when she goes outside.  She has custody of her grandkids aged 10 and 24 and they are healthy.  Is compliant with CPAP usage and denies any problems with mask or pressure. CPAP download was reviewed which shows excellent control of events on 10 cm and minimal leak and good compliance   Significant tests/ events reviewed  Spirometry 12/2011 - no airway obstruction -No reversibility with bronchodilator , mod restriction Spirometry8/2019FVC 1.64 (46%), FEV1 1.34 (49%), ratio 82 (104%)  07/2018 FENO 11  Home sleep test 07/2012 - showed desatn,no OSA  11/2015 ONO positive , Started on oxygen 2l/m .  10/2016 CT angio neg PE  CT sinus 01/2017 >> clear  12/2013 -admitted asthma exacerbation,CAP, RSV bronchitis.  EGD 03/2011 ([facility]) - peptic ulcer disease.  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 Also denies presyncope, palpitations, heartburn, abdominal pain, nausea, vomiting, diarrhea or change in bowel or urinary habits, dysuria,hematuria, rash, arthralgias, visual complaints, headache, numbness weakness or ataxia.     Objective:   Physical Exam  Gen. Pleasant, obese, in no distress ENT - no thrush, no post nasal drip Neck: No JVD, no thyromegaly, no carotid bruits Lungs: no use of accessory muscles, no dullness to percussion, decreased without rales or rhonchi  Cardiovascular: Rhythm regular, heart sounds  normal, no murmurs or gallops, no peripheral edema Musculoskeletal: No deformities, no cyanosis or clubbing , no tremors       Assessment & Plan:

## 2019-07-16 NOTE — Telephone Encounter (Signed)
Refilled Phenergan

## 2019-07-22 ENCOUNTER — Other Ambulatory Visit: Payer: Self-pay | Admitting: Family Medicine

## 2019-07-22 NOTE — Patient Outreach (Signed)
[facility]) Care Management  03/10/2019 Late entry  Shelby Anderson [birth year] [id number]   [place] attempted follow up outreach call to patient.  Patient was unavailable. HIPPA compliance voicemail message left with return callback number.  Plan: RN will call patient again within 30 days.  [facility] [phone]

## 2019-07-24 DIAGNOSIS — R0902 Hypoxemia: Secondary | ICD-10-CM | POA: Diagnosis not present

## 2019-07-24 DIAGNOSIS — J189 Pneumonia, unspecified organism: Secondary | ICD-10-CM | POA: Diagnosis not present

## 2019-07-24 DIAGNOSIS — J4540 Moderate persistent asthma, uncomplicated: Secondary | ICD-10-CM | POA: Diagnosis not present

## 2019-07-24 DIAGNOSIS — J45909 Unspecified asthma, uncomplicated: Secondary | ICD-10-CM | POA: Diagnosis not present

## 2019-07-27 ENCOUNTER — Other Ambulatory Visit: Payer: Self-pay | Admitting: *Deleted

## 2019-07-29 NOTE — Patient Outreach (Signed)
[facility]) Care Management  07/29/2019   Shelby Anderson [birth year] [id number]  RN Health Coach telephone call to patient.  Hipaa compliance verified. Per patient she does not check her blood sugars.  A1C is 5.8. Per patient she had a recent fall down the steps that caused bruising. Patient is not currently exercising due to the [place]. RN Health coach discussed home exercising. Patient has a CPAP. RN discussed CPAP effects and the care. Patient has agreed to follow up outreach calls.   Current Outpatient Medications  Medication Sig Dispense Refill  . albuterol (PROVENTIL) (2.5 MG/3ML) 0.083% nebulizer solution Take 3 mLs (2.5 mg total) by nebulization every 6 (six) hours as needed for wheezing or shortness of breath (J45.40). 75 mL 3  . aspirin EC 81 MG tablet Take 81 mg at bedtime by mouth.     Marland Kitchen atorvastatin (LIPITOR) 40 MG tablet TAKE 1 TABLET BY MOUTH EVERY DAY 90 tablet 0  . budesonide-formoterol (SYMBICORT) 80-4.5 MCG/ACT inhaler INHALE 2 PUFFS INTO THE LUNGS EVERY MORNING AND ANOTHER 2 PUFFS 12 HOURS LATER 30.6 Inhaler 1  . CINNAMON PO Take 1,000 mg 2 (two) times daily by mouth.    . Cyanocobalamin (VITAMIN B-12) 5000 MCG TBDP Take 5,000 mcg by mouth daily.     Marland Kitchen doxepin (SINEQUAN) 10 MG capsule TAKE 1 CAPSULE BY MOUTH EVERYDAY AT BEDTIME    . esomeprazole (NEXIUM) 40 MG capsule Take 1 capsule (40 mg total) by mouth 2 (two) times daily. 60 capsule 11  . fluticasone (FLONASE) 50 MCG/ACT nasal spray Place 2 sprays at bedtime as needed into both nostrils for allergies or rhinitis.     Marland Kitchen glipiZIDE (GLUCOTROL XL) 5 MG 24 hr tablet TAKE 1 TABLET BY MOUTH EVERY DAY WITH BREAKFAST 90 tablet 3  . hydrocortisone 2.5 % cream APPLY TO AFFECTED AREA TWICE A DAY. FOR DERMATITIS ON EARS FOR 2 WEEKS  3  . isosorbide mononitrate (IMDUR) 60 MG 24 hr tablet TAKE 1 TABLET BY MOUTH EVERY DAY 90 tablet 1  . L-Methylfolate (DEPLIN) 7.5 MG TABS Take 7.5 mg by mouth daily with  breakfast.     . LYRICA 150 MG capsule TAKE 1 CAPSULE BY MOUTH EVERY 8 HOURS AS DIRECTED  2  . metFORMIN (GLUCOPHAGE) 500 MG tablet Take 1 tablet (500 mg total) by mouth 2 (two) times daily with a meal. 180 tablet 1  . methocarbamol (ROBAXIN) 500 MG tablet Take 1 tablet (500 mg total) by mouth every 6 (six) hours as needed for muscle spasms. (Patient taking differently: Take 500 mg 3 (three) times daily as needed by mouth for muscle spasms. ) 60 tablet 0  . metoprolol tartrate (LOPRESSOR) 50 MG tablet TAKE 1 TABLET TWICE A DAY 180 tablet 1  . Misc Natural Products (GLUCOS-CHONDROIT-MSM COMPLEX PO) Take 1 tablet 3 (three) times daily by mouth.     . mometasone (ELOCON) 0.1 % cream APPLY TO AFFECTED AREA TWICE A DAY    . montelukast (SINGULAIR) 10 MG tablet TAKE 1 TABLET BY MOUTH EVERYDAY AT BEDTIME 30 tablet 2  . Multiple Vitamin (MULITIVITAMIN WITH MINERALS) TABS Take 1 tablet by mouth daily.      . nitroGLYCERIN (NITROSTAT) 0.3 MG SL tablet Place 0.3 mg under the tongue every 5 (five) minutes as needed for chest pain (as needed).    . nystatin cream (MYCOSTATIN) Apply 1 application topically 2 (two) times daily. 30 g 0  . ondansetron (ZOFRAN) 4 MG tablet Take 1 tablet (4 mg  total) by mouth every 4 (four) hours as needed for nausea or vomiting. 30 tablet 11  . Oxycodone HCl 20 MG TABS Take 20 mg every 4 (four) hours as needed by mouth (pain).     . OXYGEN Inhale 2 L into the lungs at bedtime.    . potassium chloride (K-DUR) 10 MEQ tablet TAKE 2 TABLETS BY MOUTH EVERY DAY 180 tablet 1  . predniSONE (DELTASONE) 10 MG tablet Take 2 tabs daily with food for 5 days, then 1 tab daily for 5 days, then stop. 15 tablet 0  . PROAIR HFA 108 (90 Base) MCG/ACT inhaler INHALE 2 PUFFS INTO THE LUNGS EVERY 4 HOURS AS NEEDED FOR SHORTNESS OF BREATH 8.5 Inhaler 3  . promethazine (PHENERGAN) 25 MG tablet Take 1 tablet (25 mg total) by mouth every 6 (six) hours as needed for nausea or vomiting. 30 tablet 1  .  sucralfate (CARAFATE) 1 g tablet Take 1 tablet (1 g total) by mouth 4 (four) times daily -  with meals and at bedtime. Before taking slowly dissolve tablet in 1 Tablespoon of distilled water for about 15 minutes to creat a slurry.  Patient needs office visit for further refills 90 tablet 1   No current facility-administered medications for this visit.     Functional Status:  In your present state of health, do you have any difficulty performing the following activities: 07/29/2019 07/27/2019  Hearing? - N  Vision? N -  Difficulty concentrating or making decisions? N -  Walking or climbing stairs? N -  Dressing or bathing? N -  Doing errands, shopping? N -  Preparing Food and eating ? N -  Using the Toilet? N -  In the past six months, have you accidently leaked urine? N -  Do you have problems with loss of bowel control? N -  Managing your Medications? N -  Managing your Finances? N -  Housekeeping or managing your Housekeeping? N -  Some recent data might be hidden    Fall/Depression Screening: Fall Risk  07/27/2019 12/02/2018 05/09/2018  Falls in the past year? 1 1 Yes  Number falls in past yr: 1 1 2  or more  Injury with Fall? 0 1 No  Comment Per patient she fell bruising elbow - bruising  Risk Factor Category  - - High Fall Risk  Risk for fall due to : History of fall(s);Impaired balance/gait;Impaired mobility - History of fall(s);Impaired balance/gait;Impaired mobility  Follow up Falls evaluation completed;Education provided;Falls prevention discussed - Falls evaluation completed;Education provided;Falls prevention discussed  Comment - - -   PHQ 2/9 Scores 07/27/2019 04/09/2019 12/02/2018 05/09/2018 12/26/2017 11/29/2017 12/28/2016  PHQ - 2 Score 4 2 3 3 3 4 2   PHQ- 9 Score 12 8 - 12 12 9 8    THN CM Care Plan Problem One     Most Recent Value  Care Plan Problem One  Knowledge Deficit in Self Management of Diabetes  Role Documenting the Problem One  [place] for  Problem One  Active  THN Long Term Goal   Patient will follow up with Health Maintenance care within the nex 90 days  THN Long Term Goal Start Date  07/29/19  Interventions for Problem One Long Term Goal  RN disucssed following up with health maintenance. Getting flu shots, mammograms, Medication adherence.RN will follow up with compliance      Assessment:  A1C is 5.8 Patient is using CPAP Recent Fall with bruising Patient is in donut hole  need assist with medications Podiatry visit 06/19/2019 Plan:  RN discussed Health maintenance follow up care Referred to [facility] for medication assistance RN discussed fall prevention RN will follow up within the month of November  [facility] Management [phone]

## 2019-07-30 ENCOUNTER — Other Ambulatory Visit: Payer: Self-pay | Admitting: Pharmacist

## 2019-07-30 NOTE — Patient Outreach (Signed)
[facility]) [place]   07/30/2019  Shelby Anderson October 13, 1956 [id number]  Reason for referral: Medication Assistance with inhalers and Lyrica  Referral source: [place] Current insurance: [facility]  PMHx includes but not limited to:  Asthma, pulmonary HTN, atrial fibrillation with PPD, CAD, OSA on CPAP, GERD, neuropathy, T2DM, morbid obesity, HLD  Outreach:  Unsuccessful telephone call attempt #1 to patient. HIPAA compliant voicemail left requesting a return call  Plan:  -I will mail patient an unsuccessful outreach letter.  -I will make another outreach attempt to patient within 3-4 business days.    Ralene Bathe, PharmD, [place] [phone]

## 2019-08-05 ENCOUNTER — Other Ambulatory Visit: Payer: Self-pay | Admitting: Pharmacist

## 2019-08-05 ENCOUNTER — Ambulatory Visit: Payer: Self-pay | Admitting: Pharmacist

## 2019-08-05 ENCOUNTER — Other Ambulatory Visit: Payer: Self-pay | Admitting: Pharmacy Technician

## 2019-08-05 NOTE — Patient Outreach (Signed)
[facility]) Care Management  [place]   08/05/2019  Shelby Anderson 06-28-1956 [id number]  Reason for referral: Medication Assistance with inhalers and Lyrica  Referral source: [place] Current insurance: [facility]  PMHx includes but not limited to:  Asthma, pulmonary HTN, atrial fibrillation with PPD, CAD, OSA on CPAP, GERD, neuropathy, T2DM, morbid obesity, HLD  Outreach:  Successful telephone call with patient.  HIPAA identifiers verified.   Subjective:  Patient reports she is in the coverage gap and is paying "hundreds" of dollars for her co-pays each month.   She states Symbicort is the most expensive.  She thinks she was on Advair in the past which did not work as well as Symbicort.  Patient reports Deplin is not covered at all by her insurance.  She also states she must have brand name Nexium and has failed all other generic PPIs.  She reports using 2 different pharmacies based on cost of medications, [facility].    Objective: The ASCVD Risk score Mikey Bussing DC Jr., et al., 2013) failed to calculate for the following reasons:   The valid total cholesterol range is 130 to 320 mg/dL  Lab Results  Component Value Date   CREATININE 0.63 04/10/2019   CREATININE 0.63 12/02/2018   CREATININE 0.62 07/24/2018    Lab Results  Component Value Date   HGBA1C 7.3 (H) 04/10/2019    Lipid Panel     Component Value Date/Time   CHOL 116 04/10/2019 0944   TRIG 166.0 (H) 04/10/2019 0944   HDL 38.70 (L) 04/10/2019 0944   CHOLHDL 3 04/10/2019 0944   VLDL 33.2 04/10/2019 0944   LDLCALC 44 04/10/2019 0944    BP Readings from Last 3 Encounters:  07/16/19 118/74  04/02/19 136/69  12/15/18 114/70    Allergies  Allergen Reactions  . Lodine [Etodolac] Anaphylaxis, Hives and Swelling  . Oxycontin [Oxycodone Hcl] Anaphylaxis    hives, trouble breathing, tongue swelling (Only Oxycontin) Tolerates plain oxycodone.  Marland Kitchen Penicillins  Anaphylaxis    Told by a surgeon never to take it again. Has patient had a PCN reaction causing immediate rash, facial/tongue/throat swelling, SOB or lightheadedness with hypotension: Yes Has patient had a PCN reaction causing severe rash involving mucus membranes or skin necrosis: Unknown Has patient had a PCN reaction that required hospitalization: No Has patient had a PCN reaction occurring within the last 10 years: No If all of the above answers are "NO", then may proceed with Cephalosporin use.  . Aspirin Other (See Comments)    High-dose caused GI Bleeds  . Darvocet [Propoxyphene N-Acetaminophen] Hives  . Nitroglycerin     IV-BP drops dramatically  . Tramadol Other (See Comments)    UNKNOWN  . Ultram [Tramadol Hcl] Hives  . Valium Other (See Comments)    Circulation problems. "Legs turned black".    Medications Reviewed Today    Reviewed by Rigoberto Noel, MD (Physician) on 07/16/19 at 1428  Med List Status: <None>  Medication Order Taking? Sig Documenting Provider Last Dose Status Informant  albuterol (PROVENTIL) (2.5 MG/3ML) 0.083% nebulizer solution [id number] Yes Take 3 mLs (2.5 mg total) by nebulization every 6 (six) hours as needed for wheezing or shortness of breath (J45.40). Javier Glazier, MD Taking Active Self  aspirin EC 81 MG tablet [id number] Yes Take 81 mg at bedtime by mouth.  [provider] Taking Active Self  atorvastatin (LIPITOR) 40 MG tablet [id number] Yes TAKE 1 TABLET BY MOUTH  EVERY DAY Lucretia Kern, DO Taking Active   budesonide-formoterol [facility]) 80-4.5 MCG/ACT inhaler [id number] Yes INHALE 2 PUFFS INTO THE LUNGS EVERY MORNING AND ANOTHER 2 PUFFS 12 HOURS LATER Martyn Ehrich, NP Taking Active   CINNAMON PO [id number] Yes Take 1,000 mg 2 (two) times daily by mouth. [provider] Taking Active Self  Cyanocobalamin (VITAMIN B-12) 5000 MCG TBDP [id number] Yes Take 5,000 mcg by mouth daily.  [provider] Taking Active Self   doxepin (SINEQUAN) 10 MG capsule [id number] Yes TAKE 1 CAPSULE BY MOUTH EVERYDAY AT BEDTIME [provider] Taking Active   esomeprazole (NEXIUM) 40 MG capsule [id number] Yes Take 1 capsule (40 mg total) by mouth 2 (two) times daily. Irene Shipper, MD Taking Active   fluticasone [facility]) 50 MCG/ACT nasal spray [id number] Yes Place 2 sprays at bedtime as needed into both nostrils for allergies or rhinitis.  [provider] Taking Active Self  glipiZIDE (GLUCOTROL XL) 5 MG 24 hr tablet [id number] Yes TAKE 1 TABLET BY MOUTH EVERY DAY WITH BREAKFAST Colin Benton R, DO Taking Active   hydrocortisone 2.5 % cream [id number] Yes APPLY TO AFFECTED AREA TWICE A DAY. FOR DERMATITIS ON EARS FOR 2 WEEKS [provider] Taking Active   isosorbide mononitrate (IMDUR) 60 MG 24 hr tablet [id number] Yes TAKE 1 TABLET BY MOUTH EVERY DAY Lucretia Kern, DO Taking Active   L-Methylfolate (DEPLIN) 7.5 MG TABS [id number] Yes Take 7.5 mg by mouth daily with breakfast.  [provider] Taking Active Self  LYRICA 150 MG capsule [id number] Yes TAKE 1 CAPSULE BY MOUTH EVERY 8 HOURS AS DIRECTED [provider] Taking Active   metFORMIN (GLUCOPHAGE) 500 MG tablet [id number] Yes Take 1 tablet (500 mg total) by mouth 2 (two) times daily with a meal. Lucretia Kern, DO Taking Active   methocarbamol (ROBAXIN) 500 MG tablet [id number] Yes Take 1 tablet (500 mg total) by mouth every 6 (six) hours as needed for muscle spasms.  Patient taking differently: Take 500 mg 3 (three) times daily as needed by mouth for muscle spasms.    Danae Orleans, PA-C Taking Active Self  metoprolol tartrate (LOPRESSOR) 50 MG tablet [id number] Yes TAKE 1 TABLET TWICE A DAY Colin Benton R, DO Taking Active   Misc Natural Products (GLUCOS-CHONDROIT-MSM COMPLEX PO) [id number] Yes Take 1 tablet 3 (three) times daily by mouth.  [provider] Taking Active Self  mometasone (ELOCON) 0.1 % cream [id number] Yes APPLY TO  AFFECTED AREA TWICE A DAY [provider] Taking Active   montelukast (SINGULAIR) 10 MG tablet [id number] Yes TAKE 1 TABLET BY MOUTH EVERYDAY AT BEDTIME Rigoberto Noel, MD Taking Active   Multiple Vitamin (MULITIVITAMIN WITH MINERALS) TABS [id number] Yes Take 1 tablet by mouth daily.   [provider] Taking Active Self  nitroGLYCERIN (NITROSTAT) 0.3 MG SL tablet [id number] Yes Place 0.3 mg under the tongue every 5 (five) minutes as needed for chest pain (as needed). [provider] Taking Active Self  nystatin cream (MYCOSTATIN) [id number] Yes Apply 1 application topically 2 (two) times daily. Fontaine, Belinda Block, MD Taking Active   ondansetron (ZOFRAN) 4 MG tablet [id number] Yes Take 1 tablet (4 mg total) by mouth every 4 (four) hours as needed for nausea or vomiting. Irene Shipper, MD Taking Active   Oxycodone HCl 20 MG TABS [id number] Yes Take 20 mg every 4 (four) hours as needed by mouth (pain).  [provider] Taking Active Self  OXYGEN [id number] Yes  Inhale 2 L into the lungs at bedtime. [provider] Taking Active Self  potassium chloride (K-DUR) 10 MEQ tablet [id number] Yes TAKE 2 TABLETS BY MOUTH DAILY Lucretia Kern, DO Taking Active   PROAIR HFA [phone] Base) MCG/ACT inhaler [id number] Yes INHALE 2 PUFFS INTO THE LUNGS EVERY 4 HOURS AS NEEDED FOR SHORTNESS OF Willia Craze, MD Taking Active   promethazine (PHENERGAN) 25 MG tablet [id number] Yes Take 1 tablet (25 mg total) by mouth every 6 (six) hours as needed for nausea or vomiting. Irene Shipper, MD Taking Active   sucralfate (CARAFATE) 1 g tablet [id number] Yes Take 1 tablet (1 g total) by mouth 4 (four) times daily -  with meals and at bedtime. Before taking slowly dissolve tablet in 1 Tablespoon of distilled water for about 15 minutes to creat a slurry.  Patient needs office visit for further refills Irene Shipper, MD Taking Active           Assessment: Drugs sorted by  system:  Neurologic/Psychologic: doxepin, L-methylfolate, pregabalin  Hematologic: aspirin 59m  Cardiovascular: atorvastatin, isosorbide mononitrate, metoprolol, NTG SL  Pulmonary/Allergy: albuterol nebulizer + inhaler, Symbicort, fluticasone NS, montelukast  Gastrointestinal: esomeprazole, ondansetron, promethazine, sucralfate  Endocrine: glipizide, metformin  Topical: mometasone cream  Pain:oxycodone, tizanidine  Vitamins/Minerals/Supplements: cinnamon, glucosamine-chondroitin-MSM complex, potassium, vitamin B12  Medication Review Findings:  . Allergies listed to aspirin, oxycontin, and NTG however patient tolerating low dose aspirin, oxycodone, and isosorbide without any issues currently . Nexium 471mBID for GERD, usual dose 20-4022mnce daily per PI, per notes in [facility], indication is breakthrough symptoms, regurgitation, particularly at night - managed by GI specialist . All medications doses appropriately for renal function  Medication Assistance Findings:  Medication assistance needs identified: Albuterol, Symbicort, Deplin   Reports estimated $36,000 annual income for household of 4 (spouse, 2 grandchildren)  Extra Help:  Not eligible for Extra Help Low Income Subsidy based on reported income and assets  Patient Assistance Programs: Symbicort made by Ast[employer] PartsIncome requirement met: Yes o Out-of-pocket prescription expenditure met:   Yes (Per patient report) - Patient has met application requirements to apply for this program.  - Reviewed program requirements with patient.    Ventolin made by GSKClaytonquirement met: Yes o Out-of-pocket prescription expenditure met:   Yes (per patient report) - Patient has met application requirements to apply for this program.  - Reviewed program requirements with patient.     Deplin: Could not find patient assistance program for Deplin.  Deplin website has savings information through BraBrunswick CorporationProvided patient with phone number to pharmacy to inquire about savings.  Patient voiced understanding and will call when convenient.   Per review of UHC formulary, preferred PPI is pantoprazole (Tier 1) or omeprazole (Tier 2).  Patient reports she failed treatment with these and can only tolerate brand name Nexium. No patient assistance program for Nexium.  PAN foundation closed.    Additional medication assistance options reviewed with patient as warranted:  Insurance OTC catalogue  Plan: . I will route patient assistance letter to THNDimondalechnician who will coordinate patient assistance program application process for medications listed above.  [facility] Beverlyarmacy technician will assist with obtaining all required documents from both patient and provider(s) and submit application(s) once completed.    ColRalene BatheharmD, [phone]

## 2019-08-05 NOTE — Patient Outreach (Signed)
[place] [facility]) Care Management  08/05/2019  Shelby Anderson Aug 02, 1956 [id number]                                       Medication Assistance Referral  Referral From: [place]  Medication/Company: Symbicort / [place] Patient application portion:  Mailed Provider application portion: Faxed  to Dr. Elsworth Soho Provider address/fax verified via: Office website  Medication/Company: [name] HFA / [place] Patient application portion:  Mailed Provider application portion: Faxed  to Dr. Elsworth Soho Provider address/fax verified via: Office website  Follow up:  Will follow up with patient in 7-10 business days to confirm application(s) have been received.  Maud Deed Chana Bode [place] Certified Pharmacy Technician [place] Management Direct Dial:[phone]

## 2019-08-07 ENCOUNTER — Telehealth: Payer: Self-pay

## 2019-08-07 ENCOUNTER — Other Ambulatory Visit: Payer: Self-pay

## 2019-08-07 MED ORDER — ALBUTEROL SULFATE HFA 108 (90 BASE) MCG/ACT IN AERS: 2.0000 | INHALATION_SPRAY | Freq: Four times a day (QID) | RESPIRATORY_TRACT | 3 refills | Status: DC | PRN

## 2019-08-07 NOTE — Telephone Encounter (Signed)
Received faxed paperwork from [facility] for Dr. Elsworth Soho to sign for symbicort.  Forms placed in Dr. Bari Mantis mailbox since he is not here today.  Form had a tear as if it was hung up in fax and small hole where # of inhalers is filled in.

## 2019-08-11 ENCOUNTER — Other Ambulatory Visit: Payer: Self-pay

## 2019-08-11 MED ORDER — ESOMEPRAZOLE MAGNESIUM 40 MG PO CPDR: 40.0000 mg | DELAYED_RELEASE_CAPSULE | Freq: Two times a day (BID) | ORAL | 6 refills | Status: DC

## 2019-08-13 ENCOUNTER — Other Ambulatory Visit: Payer: Self-pay | Admitting: Pulmonary Disease

## 2019-08-13 DIAGNOSIS — G4733 Obstructive sleep apnea (adult) (pediatric): Secondary | ICD-10-CM | POA: Diagnosis not present

## 2019-08-13 DIAGNOSIS — Z79891 Long term (current) use of opiate analgesic: Secondary | ICD-10-CM | POA: Diagnosis not present

## 2019-08-13 DIAGNOSIS — M47816 Spondylosis without myelopathy or radiculopathy, lumbar region: Secondary | ICD-10-CM | POA: Diagnosis not present

## 2019-08-13 DIAGNOSIS — R0902 Hypoxemia: Secondary | ICD-10-CM | POA: Diagnosis not present

## 2019-08-13 DIAGNOSIS — J189 Pneumonia, unspecified organism: Secondary | ICD-10-CM | POA: Diagnosis not present

## 2019-08-13 DIAGNOSIS — J45909 Unspecified asthma, uncomplicated: Secondary | ICD-10-CM | POA: Diagnosis not present

## 2019-08-13 DIAGNOSIS — G894 Chronic pain syndrome: Secondary | ICD-10-CM | POA: Diagnosis not present

## 2019-08-13 DIAGNOSIS — S9422XS Injury of deep peroneal nerve at ankle and foot level, left leg, sequela: Secondary | ICD-10-CM | POA: Diagnosis not present

## 2019-08-19 ENCOUNTER — Ambulatory Visit (INDEPENDENT_AMBULATORY_CARE_PROVIDER_SITE_OTHER): Payer: Medicare Other | Admitting: Internal Medicine

## 2019-08-19 ENCOUNTER — Encounter: Payer: Self-pay | Admitting: Internal Medicine

## 2019-08-19 VITALS — BP 122/80 | HR 60 | Temp 98.8°F | Ht 66.0 in | Wt 235.1 lb

## 2019-08-19 DIAGNOSIS — R110 Nausea: Secondary | ICD-10-CM

## 2019-08-19 DIAGNOSIS — K222 Esophageal obstruction: Secondary | ICD-10-CM | POA: Diagnosis not present

## 2019-08-19 DIAGNOSIS — K219 Gastro-esophageal reflux disease without esophagitis: Secondary | ICD-10-CM | POA: Diagnosis not present

## 2019-08-19 MED ORDER — ONDANSETRON HCL 4 MG PO TABS: 4.0000 mg | ORAL_TABLET | ORAL | 3 refills | Status: DC | PRN

## 2019-08-19 MED ORDER — SUCRALFATE 1 G PO TABS: 1.0000 g | ORAL_TABLET | Freq: Three times a day (TID) | ORAL | 3 refills | Status: DC

## 2019-08-19 MED ORDER — PROMETHAZINE HCL 25 MG PO TABS: 25.0000 mg | ORAL_TABLET | Freq: Four times a day (QID) | ORAL | 3 refills | Status: DC | PRN

## 2019-08-19 MED ORDER — ESOMEPRAZOLE MAGNESIUM 40 MG PO CPDR: 40.0000 mg | DELAYED_RELEASE_CAPSULE | Freq: Two times a day (BID) | ORAL | 3 refills | Status: DC

## 2019-08-19 NOTE — Patient Instructions (Signed)
We have sent the following medications to your pharmacy for you to pick up at your convenience:  Nexium, Carafate, Phenergan, Zofran  Please follow up in one year

## 2019-08-19 NOTE — Progress Notes (Signed)
HISTORY OF PRESENT ILLNESS:  Shelby Anderson is a 63 y.o. female with multiple significant medical problems as listed below.  She presents today for follow-up regarding her chronic GERD complicated by peptic stricture and chronic nausea.  Previous upper endoscopy also revealing mild GAVE.  Her last colonoscopy 2012 elsewhere was negative for neoplasia.  Patient was last seen in this office June 30, 2018.  See that dictation for details.  She tells me that she continues on her Nexium twice daily.  For the most part reflux symptoms are controlled.  She does have significant regurgitation with dietary indiscretion.  She is on chronic pain medication.  She will take Phenergan at night to help with nausea.  Zofran as needed during the day for the same.  Occasional Carafate.  She requests refills of all her prescriptions.  Overall she feels that her reflux has been doing better except for regurgitation episodes during the day.  She did have dysphasia to fish on one occasion but has otherwise been swallowing well.  She does not feel she needs repeat esophageal dilation at this time though I told her that if she has further difficulties to let us know.  Her last upper endoscopy was November 2018.  She does tell me that CPAP has helped her sleep apnea.  She is no longer requiring chronic oxygen therapy  REVIEW OF SYSTEMS:  All non-GI ROS negative unless otherwise stated in the HPI except for back pain, sinus and allergy, depression, fatigue, migraine headaches, heart murmur, hearing problems, muscle cramps, night sweats, nosebleeds, shortness of breath, excessive thirst, urinary leakage, sore throat, sleeping problems  Past Medical History:  Diagnosis Date  . Allergy   . Anemia    "chronic"  . Angina   . Anxiety and depression   . Arthritis   . Asthma   . Atrial fibrillation ([place])    h/o "AF w/frequent PVCs"  . Cancer (HCC)    hx of skin cancer   . CHF (congestive heart failure) ([place])   . Chronic  back pain greater than 3 months duration    on chronic narcotics, treated at pain clinic  . Colon polyps    hyperplastic  . Coronary artery disease    Arrythmia, orthostatic hypotension, HLD, HTN; sees Dr. Einar Gip  . Difficult intubation    "TMJ & woke up when they were still cutting on me"  . Dysrhythmia    sees Dr. Einar Gip and a cardiologist at [facility]  . Esophageal stricture   . Fatty liver   . Fibroids   . Fibromyalgia    "in my legs"  . GERD (gastroesophageal reflux disease)    hx hiatal hernia, stricture and gastric ulcer  . Headache(784.0)   . Heart murmur   . Hiatal hernia   . History of loop recorder   . History of migraines    "dx'd when I was in my teens"  . Hyperlipemia   . Hypertension   . Mental disorder   . Mild episode of recurrent major depressive disorder ([place]) 12/06/2015  . Myocardial infarction [facility]) 1980's & 1990;   sees Dr. Einar Gip  . OSA (obstructive sleep apnea)   . Pneumonia   . PONV (postoperative nausea and vomiting)   . Recurrent upper respiratory infection (URI)   . Shortness of breath 11/20/11   "all the time", sees pulmonlogy, ? asthma  . Stenotic cervical os   . Stomach ulcer    "3 small; found in 05/2011"  . TMJ (dislocation  of temporomandibular joint)   . Tuberculosis    + TB SKIN TEST  . Type 2 diabetes mellitus without complication, without long-term current use of insulin ([place]) 12/06/2015   not on meds     Past Surgical History:  Procedure Laterality Date  . ACHILLES TENDON REPAIR  1970's   left ankle  . ARTHROSCOPIC REPAIR ACL     left knee cap  . BREAST BIOPSY Right 04/08/2013  . CARDIAC CATHETERIZATION     loop recorder  . CARPAL TUNNEL RELEASE  unknown   left hand  . ESOPHAGOGASTRODUODENOSCOPY (EGD) WITH PROPOFOL N/A 10/29/2017   Procedure: ESOPHAGOGASTRODUODENOSCOPY (EGD) WITH PROPOFOL;  Surgeon: Irene Shipper, MD;  Location: [facility] END[facility]COPY;  Service: Endoscopy;  Laterality: N/A;  . LOOP RECORDER IMPLANT    . post  ganglionectomy  1970's   "for migraine headaches"  . pouch string  [phone]   "did this 3 times (once w/each pregnancy)"  . SAVORY DILATION N/A 10/29/2017   Procedure: SAVORY DILATION;  Surgeon: Irene Shipper, MD;  Location: [facility];  Service: Endoscopy;  Laterality: N/A;  . TOTAL KNEE ARTHROPLASTY Left 09/25/2016   Procedure: LEFT TOTAL KNEE ARTHROPLASTY;  Surgeon: Paralee Cancel, MD;  Location: [facility];  Service: Orthopedics;  Laterality: Left;  . TUBAL LIGATION  1980's    Social History Shelby Anderson  reports that she has never smoked. She has never used smokeless tobacco. She reports that she does not drink alcohol or use drugs.  family history includes Cancer in her brother, brother, brother, father, paternal grandmother, sister, and sister; Diabetes in her father and sister; Heart disease in her father, maternal grandfather, maternal grandmother, mother, paternal grandfather, paternal grandmother, and sister; Hypertension in her father, mother, sister, and sister; Malignant hyperthermia in her father.  Allergies  Allergen Reactions  . Lodine [Etodolac] Anaphylaxis, Hives and Swelling  . Oxycontin [Oxycodone Hcl] Anaphylaxis    hives, trouble breathing, tongue swelling (Only Oxycontin) Tolerates plain oxycodone.  Marland Kitchen Penicillins Anaphylaxis    Told by a surgeon never to take it again. Has patient had a PCN reaction causing immediate rash, facial/tongue/throat swelling, SOB or lightheadedness with hypotension: Yes Has patient had a PCN reaction causing severe rash involving mucus membranes or skin necrosis: Unknown Has patient had a PCN reaction that required hospitalization: No Has patient had a PCN reaction occurring within the last 10 years: No If all of the above answers are "NO", then may proceed with Cephalosporin use.  . Aspirin Other (See Comments)    High-dose caused GI Bleeds  . Darvocet [Propoxyphene N-Acetaminophen] Hives  . Nitroglycerin     IV-BP drops  dramatically  . Tramadol Hives and Itching  . Ultram [Tramadol Hcl] Hives  . Valium Other (See Comments)    Circulation problems. "Legs turned black".       PHYSICAL EXAMINATION: Vital signs: BP 122/80 (BP Location: Right Arm, Patient Position: Sitting, Cuff Size: Large)   Pulse 60   Temp 98.8 F (37.1 C) (Oral)   Ht 5\' 6"  (1.676 m)   Wt 235 lb 2 oz (106.7 kg)   BMI 37.95 kg/m   Constitutional: Pleasant, obese, unhealthy appearing, no acute distress Psychiatric: alert and oriented x3, cooperative Eyes: extraocular movements intact, anicteric, conjunctiva pink Mouth: oral pharynx moist, no lesions Neck: supple no lymphadenopathy Cardiovascular: heart regular rate and rhythm, no murmur Lungs: clear to auscultation bilaterally Abdomen: soft, obese, nontender, nondistended, no obvious ascites, no peritoneal signs, normal bowel sounds, no organomegaly Rectal: Omitted Extremities:  no clubbing or cyanosis.  1+ lower extremity edema bilaterally Skin: no lesions on visible extremities Neuro: No focal deficits.  Cranial nerves intact  ASSESSMENT:  1.  GERD complicated by peptic stricture.  Mostly controlled with twice daily Nexium.  Regurgitation at times the main issue.  No significant recurrence of dysphagia 2.  Morbid obesity 3.  Chronic nausea.  May be due to GERD.  May be due to narcotics or other medications. 4.  Colonoscopy elsewhere 2012- for neoplasia  PLAN:  1.  Reflux precautions 2.  Weight loss 3.  Refill Phenergan.  Medication risks reviewed 4.  Refill Nexium.  Medication risks reviewed 5.  Refill Zofran.  Medication risks reviewed 6.  Refill Carafate.  Proper timing of medication relative to other medications reviewed. 7.  Contact the office should dysphasia worsen.  If so, we will set up endoscopy with esophageal dilation 8.  Follow-up screening colonoscopy around 2022 9.  Routine GI office follow-up 1 year.  Sooner if needed. 25-minute spent face-to-face with  the patient.  Greater than 50% of time used for counseling regarding her complicated GERD, nausea, and their management.

## 2019-08-21 ENCOUNTER — Other Ambulatory Visit: Payer: Self-pay | Admitting: Pharmacy Technician

## 2019-08-21 NOTE — Patient Outreach (Signed)
[place] [facility]) Care Management  08/21/2019  KIMERY CANTALUPO 1956-02-20 [id number]   Received patient portion(s) of patient assistance application(s) for Ventolin HFA and Daliresp.   Sent in basket message to C. Potts, CMA for Ventolin HFA and D. Sallee Lange, RN for [employer], to check status of provider portions of applications.  Will submit applications to companies once all documents have been obtained.  Maud Deed Chana Bode [place] Certified Pharmacy Technician [place] Management Direct [phone]

## 2019-08-24 ENCOUNTER — Encounter: Payer: Medicare Other | Admitting: Gynecology

## 2019-08-24 DIAGNOSIS — J4540 Moderate persistent asthma, uncomplicated: Secondary | ICD-10-CM | POA: Diagnosis not present

## 2019-08-24 DIAGNOSIS — J189 Pneumonia, unspecified organism: Secondary | ICD-10-CM | POA: Diagnosis not present

## 2019-08-24 DIAGNOSIS — J45909 Unspecified asthma, uncomplicated: Secondary | ICD-10-CM | POA: Diagnosis not present

## 2019-08-24 DIAGNOSIS — R0902 Hypoxemia: Secondary | ICD-10-CM | POA: Diagnosis not present

## 2019-08-25 ENCOUNTER — Ambulatory Visit
Admission: RE | Admit: 2019-08-25 | Discharge: 2019-08-25 | Disposition: A | Discharge status: Home / Self Care | Payer: Medicare Other | Source: Ambulatory Visit | Attending: Family Medicine | Admitting: Family Medicine

## 2019-08-25 ENCOUNTER — Other Ambulatory Visit: Payer: Self-pay

## 2019-08-25 DIAGNOSIS — Z1231 Encounter for screening mammogram for malignant neoplasm of breast: Secondary | ICD-10-CM | POA: Diagnosis not present

## 2019-08-27 ENCOUNTER — Other Ambulatory Visit: Payer: Self-pay | Admitting: Family Medicine

## 2019-08-27 ENCOUNTER — Telehealth: Payer: Self-pay | Admitting: *Deleted

## 2019-08-27 DIAGNOSIS — R928 Other abnormal and inconclusive findings on diagnostic imaging of breast: Secondary | ICD-10-CM

## 2019-08-27 NOTE — Telephone Encounter (Signed)
I called the pt to discuss COVID screening questions prior to the 1pm appt on 9/25.  Patient stated she has had a sore throat for 2 weeks and I advised her the visit would need to be done virtually, in which the doctor can call her for the transfer of care visit or this can be done via cell number.  Patient stated she does not want a visit over the phone and she was seen by her GI doctor who stated the sore throat was due to reflux.  I again advised the pt patients with a sore throat cannot come in to the office and she raised her voice again and stated she does not want a phone visit.  Call transferred to [facility], clinical supervisor who stated she attempted to explain this to the pt also.  Message sent to Dr Ethlyn Gallery.

## 2019-08-27 NOTE — Telephone Encounter (Signed)
I called the pt and informed her of the message below.  Patient agreed, appt for 9/25 was cancelled and an appt  rescheduled for 11/13.

## 2019-08-27 NOTE — Telephone Encounter (Signed)
Looks like she is due for some repeat bloodwork in November. Would she be ok with delaying in office visit at this time and rescheduling for November when we can get bloodwork at the same time?

## 2019-08-28 ENCOUNTER — Encounter: Payer: Medicare Other | Admitting: Family Medicine

## 2019-08-31 ENCOUNTER — Ambulatory Visit
Admission: RE | Admit: 2019-08-31 | Discharge: 2019-08-31 | Disposition: A | Discharge status: Home / Self Care | Payer: Medicare Other | Source: Ambulatory Visit | Attending: Family Medicine | Admitting: Family Medicine

## 2019-08-31 ENCOUNTER — Other Ambulatory Visit: Payer: Self-pay | Admitting: Family Medicine

## 2019-08-31 ENCOUNTER — Other Ambulatory Visit: Payer: Self-pay

## 2019-08-31 DIAGNOSIS — N6320 Unspecified lump in the left breast, unspecified quadrant: Secondary | ICD-10-CM

## 2019-08-31 DIAGNOSIS — N6489 Other specified disorders of breast: Secondary | ICD-10-CM | POA: Diagnosis not present

## 2019-08-31 DIAGNOSIS — R928 Other abnormal and inconclusive findings on diagnostic imaging of breast: Secondary | ICD-10-CM | POA: Diagnosis not present

## 2019-08-31 DIAGNOSIS — N6321 Unspecified lump in the left breast, upper outer quadrant: Secondary | ICD-10-CM | POA: Diagnosis not present

## 2019-09-01 ENCOUNTER — Encounter: Payer: Self-pay | Admitting: Gynecology

## 2019-09-01 ENCOUNTER — Other Ambulatory Visit: Payer: Self-pay | Admitting: Pharmacy Technician

## 2019-09-01 NOTE — Patient Outreach (Signed)
[facility] [facility]) Care Management  09/01/2019  Shelby Anderson [birth year] [id number]   Followed up with Dr. Bari Mantis office multiple times on provider portion of application for Symbicort as well as script for Ventolin HFA.  Faxed patient portions of applications into companies in case provider portions have been faxed to them directly.  Will follow up with [place] and [employer] in 3-5 business days and if provider portion of applications are received, will still fax into companies just in case.  Maud Deed Chana Bode [place] Certified Pharmacy Technician [place] Management Direct Dial:[phone]

## 2019-09-02 ENCOUNTER — Ambulatory Visit
Admission: RE | Admit: 2019-09-02 | Discharge: 2019-09-02 | Disposition: A | Discharge status: Home / Self Care | Payer: Medicare Other | Source: Ambulatory Visit | Attending: Family Medicine | Admitting: Family Medicine

## 2019-09-02 ENCOUNTER — Other Ambulatory Visit (HOSPITAL_COMMUNITY): Payer: Self-pay | Admitting: Diagnostic Radiology

## 2019-09-02 ENCOUNTER — Other Ambulatory Visit: Payer: Self-pay

## 2019-09-02 DIAGNOSIS — N6320 Unspecified lump in the left breast, unspecified quadrant: Secondary | ICD-10-CM

## 2019-09-02 DIAGNOSIS — N6321 Unspecified lump in the left breast, upper outer quadrant: Secondary | ICD-10-CM | POA: Diagnosis not present

## 2019-09-02 DIAGNOSIS — C50412 Malignant neoplasm of upper-outer quadrant of left female breast: Secondary | ICD-10-CM | POA: Diagnosis not present

## 2019-09-03 ENCOUNTER — Encounter: Payer: Self-pay | Admitting: *Deleted

## 2019-09-04 ENCOUNTER — Other Ambulatory Visit: Payer: Self-pay | Admitting: Pharmacy Technician

## 2019-09-04 NOTE — Patient Outreach (Addendum)
[facility]) Care Management  09/04/2019  JIANNA GIUDICE 01-02-1956 [id number]    Follow up call placed to [place] regarding patient assistance application(s) for Ventolin HFA , Lissa Morales confirms patient has been approved as of 9/30 until 12/03/19. However script for Ventolin HFA has not been sent therefore shipping is delayed.    Follow up call placed to [employer] regarding patient assistance application(s) for Symbicort , Alyssa confirms patient portion of application has been received but provider portion has not.  Follow up:  Will follow up with Dr. Elsworth Soho to check status of missing documents.   ADDENDUM 2:05pm  Received provider portion(s) of patient assistance application(s) for Symbicort. Faxed completed application and required documents into [employer]ME.  Will follow up with company(ies) in 3-5 business days to check status of application(s).   Maud Deed Chana Bode [place] Certified Pharmacy Technician [place] Management Direct Dial:[phone]

## 2019-09-07 ENCOUNTER — Other Ambulatory Visit: Payer: Self-pay | Admitting: General Surgery

## 2019-09-07 ENCOUNTER — Encounter: Payer: Medicare Other | Admitting: Gynecology

## 2019-09-07 ENCOUNTER — Other Ambulatory Visit: Payer: Self-pay | Admitting: *Deleted

## 2019-09-07 DIAGNOSIS — Z171 Estrogen receptor negative status [ER-]: Secondary | ICD-10-CM

## 2019-09-07 DIAGNOSIS — J9611 Chronic respiratory failure with hypoxia: Secondary | ICD-10-CM

## 2019-09-07 DIAGNOSIS — J45991 Cough variant asthma: Secondary | ICD-10-CM

## 2019-09-07 DIAGNOSIS — C50412 Malignant neoplasm of upper-outer quadrant of left female breast: Secondary | ICD-10-CM | POA: Insufficient documentation

## 2019-09-07 DIAGNOSIS — C50411 Malignant neoplasm of upper-outer quadrant of right female breast: Secondary | ICD-10-CM | POA: Insufficient documentation

## 2019-09-07 MED ORDER — ALBUTEROL SULFATE HFA 108 (90 BASE) MCG/ACT IN AERS: 2.0000 | INHALATION_SPRAY | Freq: Four times a day (QID) | RESPIRATORY_TRACT | 3 refills | Status: DC | PRN

## 2019-09-08 ENCOUNTER — Other Ambulatory Visit: Payer: Self-pay | Admitting: Pharmacy Technician

## 2019-09-08 NOTE — Patient Outreach (Signed)
[place] [facility]) Care Management  09/08/2019  Shelby Anderson Oct 22, 1956 [id number]    Follow up call placed to [employer] regarding patient assistance application(s) for Symbicort , Shelby Anderson confirms patient has been approved as of 10/6 until 12/03/19. Medication to arrive in 7-10 business days.   Follow up call placed to [place] regarding patient assistance shipping details for Ventolin HFA, Shelby Anderson confirms script was received and medication to arrive at patients home in 7-10 business days.  Follow up:  Will follow up with patient in 10-14 business days to confirm medications have been recieved.  Shelby Anderson [place] Certified Pharmacy Technician [place] Management Direct Dial:[phone]

## 2019-09-08 NOTE — Progress Notes (Signed)
[place] NOTE  Patient Care Team: Lucretia Kern, DO as PCP - General (Family Medicine) Rigoberto Noel, MD as Consulting Physician (Pulmonary Disease) Adrian Prows, MD as Consulting Physician (Cardiology) Lenon Oms, MD as Referring Physician (Obstetrics and Gynecology) Veneda Melter, MD as Referring Physician (Cardiology) [name], MD as Consulting Physician (Anesthesiology) Terrance Mass, MD (Inactive) as Consulting Physician (Gynecology) Pleasant, Eppie Gibson, RN as [place] Management Summe, Octavio Graves, [facility] as [place] Management (Pharmacist) Adaline Sill, CPhT as [place] Management (Pharmacy Technician) Rockwell Germany, RN as Oncology Nurse Navigator Mauro Kaufmann, RN as Oncology Nurse Navigator Rolm Bookbinder, MD as Consulting Physician (General Surgery) Nicholas Lose, MD as Consulting Physician (Hematology and Oncology) Kyung Rudd, MD as Consulting Physician (Radiation Oncology)  CHIEF COMPLAINTS/PURPOSE OF CONSULTATION:  Newly diagnosed breast cancer  HISTORY OF PRESENTING ILLNESS:  Shelby Anderson 63 y.o. female is here because of recent diagnosis of triple negative invasive ductal carcinoma of the left breast. The cancer was detected on a routine screening mammogram on 08/25/19 and palpable thickening was noted on exam. Diagnostic left breast mammogram on 08/31/19 showed a 2.1cm mass in the left breast and no left axillary adenopathy. Biopsy on 09/02/19 showed invasive ductal carcinoma with DCIS, grade 3, HER-2 negative (1+), ER/PR negative, Ki67 70%. She presents to the clinic today for initial evaluation and discussion of treatment options.  She tells me that she was diagnosed with congestive heart failure and she has hypertension and diabetes.  She has mild peripheral neuropathy.  I reviewed her records extensively and collaborated the history with the  patient.  SUMMARY OF ONCOLOGIC HISTORY: Oncology History  Malignant neoplasm of upper-outer quadrant of right breast in female, estrogen receptor negative ([place])  09/07/2019 Initial Diagnosis   Routine screening mammogram detected a 2.1cm mass in the left breast and no left axillary adenopathy. 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     MEDICAL HISTORY:  Past Medical History:  Diagnosis Date   Allergy    Anemia    "chronic"   Angina    Anxiety and depression    Arthritis    Asthma    Atrial fibrillation ([place])    h/o "AF w/frequent PVCs"   Cancer ([place])    hx of skin cancer    CHF (congestive heart failure) (HCC)    Chronic back pain greater than 3 months duration    on chronic narcotics, treated at pain clinic   Colon polyps    hyperplastic   Coronary artery disease    Arrythmia, orthostatic hypotension, HLD, HTN; sees Dr. Einar Gip   Difficult intubation    "TMJ & woke up when they were still cutting on me"   Dysrhythmia    sees Dr. Einar Gip and a cardiologist at [facility]   Esophageal stricture    Fatty liver    Fibroids    Fibromyalgia    "in my legs"   GERD (gastroesophageal reflux disease)    hx hiatal hernia, stricture and gastric ulcer   Headache(784.0)    Heart murmur    Hiatal hernia    History of loop recorder    History of migraines    "dx'd when I was in my teens"   Hyperlipemia    Hypertension    Mental disorder    Mild episode of recurrent major depressive disorder ([place]) 12/06/2015   Myocardial infarction (  [place]) 1's & 1990;   sees Dr. Einar Gip   OSA (obstructive sleep apnea)    Pneumonia    PONV (postoperative nausea and vomiting)    Recurrent upper respiratory infection (URI)    Shortness of breath 11/20/11   "all the time", sees pulmonlogy, ? asthma   Stenotic cervical os    Stomach ulcer    "3 small; found in 05/2011"   TMJ (dislocation of temporomandibular joint)    Tuberculosis     + TB SKIN TEST   Type 2 diabetes mellitus without complication, without long-term current use of insulin ([place]) 12/06/2015   not on meds     SURGICAL HISTORY: Past Surgical History:  Procedure Laterality Date   ACHILLES TENDON REPAIR  1970's   left ankle   ARTHROSCOPIC REPAIR ACL     left knee cap   BREAST BIOPSY Right 04/08/2013   CARDIAC CATHETERIZATION     loop recorder   CARPAL TUNNEL RELEASE  unknown   left hand   ESOPHAGOGASTRODUODENOSCOPY (EGD) WITH PROPOFOL N/A 10/29/2017   Procedure: ESOPHAGOGASTRODUODENOSCOPY (EGD) WITH PROPOFOL;  Surgeon: Irene Shipper, MD;  Location: [facility];  Service: Endoscopy;  Laterality: N/A;   LOOP RECORDER IMPLANT     post ganglionectomy  1970's   "for migraine headaches"   pouch string  79,81,83   "did this 3 times (once w/each pregnancy)"   SAVORY DILATION N/A 10/29/2017   Procedure: SAVORY DILATION;  Surgeon: Irene Shipper, MD;  Location: [facility];  Service: Endoscopy;  Laterality: N/A;   TOTAL KNEE ARTHROPLASTY Left 09/25/2016   Procedure: LEFT TOTAL KNEE ARTHROPLASTY;  Surgeon: Paralee Cancel, MD;  Location: [facility];  Service: Orthopedics;  Laterality: Left;   TUBAL LIGATION  1980's    SOCIAL HISTORY: Social History   Socioeconomic History   Marital status: Married    Spouse name: Not on file   Number of children: 3   Years of education: Not on file   Highest education level: Not on file  Occupational History   Occupation: Retired Designer, multimedia strain: Not on file   Food insecurity    Worry: Not on file    Inability: Not on Lexicographer needs    Medical: Not on file    Non-medical: Not on file  Tobacco Use   Smoking status: Never Smoker   Smokeless tobacco: Never Used  Substance and Sexual Activity   Alcohol use: No    Alcohol/week: 0.0 standard drinks   Drug use: No   Sexual activity: Not Currently    Birth control/protection: Surgical    Comment: 1st  intercourse 63 yo-Fewer than 5 partners  Lifestyle   Physical activity    Days per week: Not on file    Minutes per session: Not on file   Stress: Not on file  Relationships   Social connections    Talks on phone: Not on file    Gets together: Not on file    Attends religious service: Not on file    Active member of club or organization: Not on file    Attends meetings of clubs or organizations: Not on file    Relationship status: Not on file   Intimate partner violence    Fear of current or ex partner: Not on file    Emotionally abused: Not on file    Physically abused: Not on file    Forced sexual activity: Not on file  Other Topics Concern  Not on file  Social History Narrative   Not on file    FAMILY HISTORY: Family History  Problem Relation Age of Onset   Malignant hyperthermia Father    Hypertension Father    Heart disease Father    Diabetes Father    Cancer Father        skin   Hypertension Mother    Heart disease Mother    Cancer Sister        CERVICAL   Hypertension Sister    Cancer Brother        MELANOMA   Heart disease Maternal Grandmother    Heart disease Maternal Grandfather    Cancer Paternal Grandmother        ?    Heart disease Paternal Grandmother    Heart disease Paternal Grandfather    Cancer Brother        LUNG   Diabetes Sister    Hypertension Sister    Heart disease Sister    Cancer Sister    Cancer Brother    Anesthesia problems Neg Hx    Hypotension Neg Hx    Pseudochol deficiency Neg Hx    Colon cancer Neg Hx    Esophageal cancer Neg Hx    Pancreatic cancer Neg Hx    Rectal cancer Neg Hx    Stomach cancer Neg Hx    Breast cancer Neg Hx     ALLERGIES:  is allergic to lodine [etodolac]; oxycontin [oxycodone hcl]; penicillins; aspirin; darvocet [propoxyphene n-acetaminophen]; nitroglycerin; tramadol; ultram [tramadol hcl]; and valium.  MEDICATIONS:  Current Outpatient Medications    Medication Sig Dispense Refill   albuterol (PROVENTIL) (2.5 MG/3ML) 0.083% nebulizer solution Take 3 mLs (2.5 mg total) by nebulization every 6 (six) hours as needed for wheezing or shortness of breath (J45.40). 75 mL 3   albuterol (VENTOLIN HFA) 108 (90 Base) MCG/ACT inhaler Inhale 2 puffs into the lungs every 6 (six) hours as needed for wheezing or shortness of breath. 3 g 3   aspirin EC 81 MG tablet Take 81 mg at bedtime by mouth.      atorvastatin (LIPITOR) 40 MG tablet TAKE 1 TABLET BY MOUTH EVERY DAY 90 tablet 0   budesonide-formoterol (SYMBICORT) 80-4.5 MCG/ACT inhaler INHALE 2 PUFFS INTO THE LUNGS EVERY MORNING AND ANOTHER 2 PUFFS 12 HOURS LATER 30.6 Inhaler 1   CINNAMON PO Take 1,000 mg 2 (two) times daily by mouth.     doxepin (SINEQUAN) 10 MG capsule TAKE 1 CAPSULE BY MOUTH EVERYDAY AT BEDTIME     esomeprazole (NEXIUM) 40 MG capsule Take 1 capsule (40 mg total) by mouth 2 (two) times daily. 180 capsule 3   fluticasone (FLONASE) 50 MCG/ACT nasal spray Place 2 sprays at bedtime as needed into both nostrils for allergies or rhinitis.      glipiZIDE (GLUCOTROL XL) 5 MG 24 hr tablet TAKE 1 TABLET BY MOUTH EVERY DAY WITH BREAKFAST 90 tablet 3   isosorbide mononitrate (IMDUR) 60 MG 24 hr tablet TAKE 1 TABLET BY MOUTH EVERY DAY 90 tablet 1   L-Methylfolate (DEPLIN) 7.5 MG TABS Take 7.5 mg by mouth daily with breakfast.      LYRICA 150 MG capsule TAKE 1 CAPSULE BY MOUTH EVERY 8 HOURS AS DIRECTED  2   metFORMIN (GLUCOPHAGE) 500 MG tablet Take 1 tablet (500 mg total) by mouth 2 (two) times daily with a meal. 180 tablet 1   metoprolol tartrate (LOPRESSOR) 50 MG tablet TAKE 1 TABLET TWICE A DAY 180 tablet  1   Misc Natural Products (GLUCOS-CHONDROIT-MSM COMPLEX PO) Take 1 tablet 3 (three) times daily by mouth.      mometasone (ELOCON) 0.1 % cream Apply 1 application topically 2 (two) times daily as needed.      montelukast (SINGULAIR) 10 MG tablet TAKE 1 TABLET BY MOUTH EVERYDAY AT  BEDTIME 90 tablet 1   Multiple Vitamin (MULITIVITAMIN WITH MINERALS) TABS Take 1 tablet by mouth daily.       nitroGLYCERIN (NITROSTAT) 0.3 MG SL tablet Place 0.3 mg under the tongue every 5 (five) minutes as needed for chest pain (as needed).     ondansetron (ZOFRAN) 4 MG tablet Take 1 tablet (4 mg total) by mouth every 4 (four) hours as needed for nausea or vomiting. 90 tablet 3   Oxycodone HCl 20 MG TABS Take 20 mg every 4 (four) hours as needed by mouth (pain).      OXYGEN Inhale 2 L into the lungs at bedtime.     potassium chloride (K-DUR) 10 MEQ tablet TAKE 2 TABLETS BY MOUTH EVERY DAY 180 tablet 1   PROAIR HFA 108 (90 Base) MCG/ACT inhaler INHALE 2 PUFFS INTO THE LUNGS EVERY 4 HOURS AS NEEDED FOR SHORTNESS OF BREATH 8.5 Inhaler 3   promethazine (PHENERGAN) 25 MG tablet Take 1 tablet (25 mg total) by mouth every 6 (six) hours as needed for nausea or vomiting. 90 tablet 3   sucralfate (CARAFATE) 1 g tablet Take 1 tablet (1 g total) by mouth 4 (four) times daily -  with meals and at bedtime. Before taking slowly dissolve tablet in 1 Tablespoon of distilled water for about 15 minutes to creat a slurry. 360 tablet 3   tiZANidine (ZANAFLEX) 4 MG tablet Take 4 mg by mouth every 8 (eight) hours as needed for muscle spasms.     vitamin B-12 (CYANOCOBALAMIN) 500 MCG tablet Take 500 mcg by mouth daily.      No current facility-administered medications for this visit.     REVIEW OF SYSTEMS:   Constitutional: Denies fevers, chills or abnormal night sweats Eyes: Denies blurriness of vision, double vision or watery eyes Ears, nose, mouth, throat, and face: Denies mucositis or sore throat Respiratory: Denies cough, dyspnea or wheezes Cardiovascular: Denies palpitation, chest discomfort or lower extremity swelling Gastrointestinal:  Denies nausea, heartburn or change in bowel habits Skin: Denies abnormal skin rashes Lymphatics: Denies new lymphadenopathy or easy bruising Neurological: Mild  peripheral neuropathy Behavioral/Psych: Mood is stable, no new changes  Breast: Palpable skin thickening of left breast All other systems were reviewed with the patient and are negative.  PHYSICAL EXAMINATION: ECOG PERFORMANCE STATUS: 1 - Symptomatic but completely ambulatory  Vitals:   09/09/19 1248  BP: 126/83  Pulse: 63  Resp: 18  Temp: 98.9 F (37.2 C)  SpO2: 94%   Filed Weights   09/09/19 1248  Weight: 229 lb 12.8 oz (104.2 kg)    GENERAL:alert, no distress and comfortable SKIN: skin color, texture, turgor are normal, no rashes or significant lesions EYES: normal, conjunctiva are pink and non-injected, sclera clear OROPHARYNX:no exudate, no erythema and lips, buccal mucosa, and tongue normal  NECK: supple, thyroid normal size, non-tender, without nodularity LYMPH:  no palpable lymphadenopathy in the cervical, axillary or inguinal LUNGS: clear to auscultation and percussion with normal breathing effort HEART: regular rate & rhythm and no murmurs and no lower extremity edema ABDOMEN:abdomen soft, non-tender and normal bowel sounds Musculoskeletal:no cyanosis of digits and no clubbing  PSYCH: alert & oriented x 3  with fluent speech NEURO: no focal motor/sensory deficits BREAST: No palpable nodules in breast. No palpable axillary or supraclavicular lymphadenopathy (exam performed in the presence of a chaperone)   LABORATORY DATA:  I have reviewed the data as listed Lab Results  Component Value Date   WBC 7.3 09/09/2019   HGB 13.8 09/09/2019   HCT 44.1 09/09/2019   MCV 91.3 09/09/2019   PLT 264 09/09/2019   Lab Results  Component Value Date   NA 144 09/09/2019   K 4.0 09/09/2019   CL 104 09/09/2019   CO2 31 09/09/2019    RADIOGRAPHIC STUDIES: I have personally reviewed the radiological reports and agreed with the findings in the report.  ASSESSMENT AND PLAN:  Malignant neoplasm of upper-outer quadrant of right breast in female, estrogen receptor negative  ([place]) 09/07/2019:Routine screening mammogram detected a 2.1cm mass in the left breast and no left axillary adenopathy. 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  Pathology and radiology counseling:Discussed with the patient, the details of pathology including the type of breast cancer,the clinical staging, the significance of ER, PR and HER-2/neu receptors and the implications for treatment. After reviewing the pathology in detail, we proceeded to discuss the different treatment options between surgery, radiation, chemotherapy, antiestrogen therapies.  Recommendations: 1.  Neoadjuvant chemotherapy with Taxotere and Cytoxan x6 cycles 2. Breast conserving surgery followed by 3. Adjuvant radiation therapy   Chemotherapy Counseling: I discussed the risks and benefits of chemotherapy including the risks of nausea/ vomiting, risk of infection from low WBC count, fatigue due to chemo or anemia, bruising or bleeding due to low platelets, mouth sores, loss/ change in taste and decreased appetite. Liver and kidney function will be monitored through out chemotherapy as abnormalities in liver and kidney function may be a side effect of treatment.   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 Risk of permanent bone marrow dysfunction and leukemia due to chemo were also discussed.  We decided to do Taxotere and Cytoxan because she has a history of congestive heart failure and has comorbidities that can put her at increased risk of heart failure  Plan: Port placement, chemo class, participation in [place] 1714 clinical trial  All questions were answered. The patient knows to call the clinic with any problems, questions or concerns.   Rulon Eisenmenger, MD 09/09/2019    I, Molly Dorshimer, am acting as scribe for Nicholas Lose, MD.  I have reviewed the above documentation for accuracy and completeness, and I agree with the above.

## 2019-09-09 ENCOUNTER — Encounter: Payer: Self-pay | Admitting: *Deleted

## 2019-09-09 ENCOUNTER — Ambulatory Visit: Payer: Medicare Other | Attending: General Surgery | Admitting: Physical Therapy

## 2019-09-09 ENCOUNTER — Encounter: Payer: Self-pay | Admitting: General Practice

## 2019-09-09 ENCOUNTER — Ambulatory Visit (HOSPITAL_BASED_OUTPATIENT_CLINIC_OR_DEPARTMENT_OTHER): Payer: Medicare Other | Admitting: Genetic Counselor

## 2019-09-09 ENCOUNTER — Encounter: Payer: Self-pay | Admitting: Genetic Counselor

## 2019-09-09 ENCOUNTER — Inpatient Hospital Stay (HOSPITAL_BASED_OUTPATIENT_CLINIC_OR_DEPARTMENT_OTHER): Payer: Medicare Other | Admitting: Hematology and Oncology

## 2019-09-09 ENCOUNTER — Encounter: Payer: Self-pay | Admitting: Physical Therapy

## 2019-09-09 ENCOUNTER — Other Ambulatory Visit: Payer: Self-pay

## 2019-09-09 ENCOUNTER — Other Ambulatory Visit: Payer: Self-pay | Admitting: General Surgery

## 2019-09-09 ENCOUNTER — Ambulatory Visit
Admission: RE | Admit: 2019-09-09 | Discharge: 2019-09-09 | Disposition: A | Discharge status: Home / Self Care | Payer: Medicare Other | Source: Ambulatory Visit | Attending: Radiation Oncology | Admitting: Radiation Oncology

## 2019-09-09 ENCOUNTER — Inpatient Hospital Stay: Payer: Medicare Other | Attending: Oncology

## 2019-09-09 DIAGNOSIS — K219 Gastro-esophageal reflux disease without esophagitis: Secondary | ICD-10-CM | POA: Insufficient documentation

## 2019-09-09 DIAGNOSIS — Z808 Family history of malignant neoplasm of other organs or systems: Secondary | ICD-10-CM | POA: Diagnosis not present

## 2019-09-09 DIAGNOSIS — G4733 Obstructive sleep apnea (adult) (pediatric): Secondary | ICD-10-CM | POA: Insufficient documentation

## 2019-09-09 DIAGNOSIS — M797 Fibromyalgia: Secondary | ICD-10-CM | POA: Diagnosis not present

## 2019-09-09 DIAGNOSIS — Z7982 Long term (current) use of aspirin: Secondary | ICD-10-CM | POA: Insufficient documentation

## 2019-09-09 DIAGNOSIS — C50412 Malignant neoplasm of upper-outer quadrant of left female breast: Secondary | ICD-10-CM | POA: Diagnosis not present

## 2019-09-09 DIAGNOSIS — Z171 Estrogen receptor negative status [ER-]: Secondary | ICD-10-CM | POA: Diagnosis not present

## 2019-09-09 DIAGNOSIS — I2510 Atherosclerotic heart disease of native coronary artery without angina pectoris: Secondary | ICD-10-CM | POA: Insufficient documentation

## 2019-09-09 DIAGNOSIS — Z809 Family history of malignant neoplasm, unspecified: Secondary | ICD-10-CM | POA: Diagnosis not present

## 2019-09-09 DIAGNOSIS — Z800 Family history of malignant neoplasm of digestive organs: Secondary | ICD-10-CM | POA: Diagnosis not present

## 2019-09-09 DIAGNOSIS — I509 Heart failure, unspecified: Secondary | ICD-10-CM | POA: Diagnosis not present

## 2019-09-09 DIAGNOSIS — E1142 Type 2 diabetes mellitus with diabetic polyneuropathy: Secondary | ICD-10-CM | POA: Diagnosis not present

## 2019-09-09 DIAGNOSIS — I110 Hypertensive heart disease with heart failure: Secondary | ICD-10-CM | POA: Diagnosis not present

## 2019-09-09 DIAGNOSIS — Z1379 Encounter for other screening for genetic and chromosomal anomalies: Secondary | ICD-10-CM

## 2019-09-09 DIAGNOSIS — I4891 Unspecified atrial fibrillation: Secondary | ICD-10-CM | POA: Diagnosis not present

## 2019-09-09 DIAGNOSIS — Z79899 Other long term (current) drug therapy: Secondary | ICD-10-CM | POA: Insufficient documentation

## 2019-09-09 DIAGNOSIS — J45909 Unspecified asthma, uncomplicated: Secondary | ICD-10-CM | POA: Insufficient documentation

## 2019-09-09 DIAGNOSIS — C50411 Malignant neoplasm of upper-outer quadrant of right female breast: Secondary | ICD-10-CM

## 2019-09-09 DIAGNOSIS — M1990 Unspecified osteoarthritis, unspecified site: Secondary | ICD-10-CM | POA: Diagnosis not present

## 2019-09-09 DIAGNOSIS — Z95818 Presence of other cardiac implants and grafts: Secondary | ICD-10-CM

## 2019-09-09 DIAGNOSIS — F419 Anxiety disorder, unspecified: Secondary | ICD-10-CM | POA: Diagnosis not present

## 2019-09-09 DIAGNOSIS — K760 Fatty (change of) liver, not elsewhere classified: Secondary | ICD-10-CM | POA: Insufficient documentation

## 2019-09-09 DIAGNOSIS — Z23 Encounter for immunization: Secondary | ICD-10-CM | POA: Diagnosis not present

## 2019-09-09 DIAGNOSIS — R293 Abnormal posture: Secondary | ICD-10-CM | POA: Insufficient documentation

## 2019-09-09 DIAGNOSIS — I252 Old myocardial infarction: Secondary | ICD-10-CM | POA: Insufficient documentation

## 2019-09-09 DIAGNOSIS — E785 Hyperlipidemia, unspecified: Secondary | ICD-10-CM | POA: Insufficient documentation

## 2019-09-09 LAB — CBC WITH DIFFERENTIAL (CANCER CENTER ONLY)
Abs Immature Granulocytes: 0.01 10*3/uL (ref 0.00–0.07)
Basophils Absolute: 0 10*3/uL (ref 0.0–0.1)
Basophils Relative: 0 %
Eosinophils Absolute: 0.2 10*3/uL (ref 0.0–0.5)
Eosinophils Relative: 3 %
HCT: 44.1 % (ref 36.0–46.0)
Hemoglobin: 13.8 g/dL (ref 12.0–15.0)
Immature Granulocytes: 0 %
Lymphocytes Relative: 28 %
Lymphs Abs: 2 10*3/uL (ref 0.7–4.0)
MCH: 28.6 pg (ref 26.0–34.0)
MCHC: 31.3 g/dL (ref 30.0–36.0)
MCV: 91.3 fL (ref 80.0–100.0)
Monocytes Absolute: 0.5 10*3/uL (ref 0.1–1.0)
Monocytes Relative: 7 %
Neutro Abs: 4.5 10*3/uL (ref 1.7–7.7)
Neutrophils Relative %: 62 %
Platelet Count: 264 10*3/uL (ref 150–400)
RBC: 4.83 MIL/uL (ref 3.87–5.11)
RDW: 13.9 % (ref 11.5–15.5)
WBC Count: 7.3 10*3/uL (ref 4.0–10.5)
nRBC: 0 % (ref 0.0–0.2)

## 2019-09-09 LAB — CMP (CANCER CENTER ONLY)
ALT: 26 U/L (ref 0–44)
AST: 31 U/L (ref 15–41)
Albumin: 3.5 g/dL (ref 3.5–5.0)
Alkaline Phosphatase: 74 U/L (ref 38–126)
Anion gap: 9 (ref 5–15)
BUN: 11 mg/dL (ref 8–23)
CO2: 31 mmol/L (ref 22–32)
Calcium: 9.3 mg/dL (ref 8.9–10.3)
Chloride: 104 mmol/L (ref 98–111)
Creatinine: 0.68 mg/dL (ref 0.44–1.00)
GFR, Est AFR Am: >60 mL/min (ref >60)
GFR, Estimated: >60 mL/min (ref >60)
Glucose, Bld: 99 mg/dL (ref 70–99)
Potassium: 4 mmol/L (ref 3.5–5.1)
Sodium: 144 mmol/L (ref 135–145)
Total Bilirubin: 0.3 mg/dL (ref 0.3–1.2)
Total Protein: 7.5 g/dL (ref 6.5–8.1)

## 2019-09-09 NOTE — Patient Instructions (Signed)

## 2019-09-09 NOTE — Therapy (Signed)
[place], [place], [address] Phone: [phone]   Fax:  [phone]  Physical Therapy Evaluation  Patient Details  Name: Shelby Anderson MRN: [id number] Date of Birth: 10/06/1956 Referring Provider (PT): Dr. Rolm Bookbinder   Encounter Date: 09/09/2019  PT End of Session - 09/09/19 1420    Visit Number  1    Number of Visits  1    PT Start Time  1304    PT Stop Time  1327   Also saw pt from 1359-1404 for a total of 28 minutes   PT Time Calculation (min)  23 min    Activity Tolerance  Patient tolerated treatment well    Behavior During Therapy  [facility] for tasks assessed/performed       Past Medical History:  Diagnosis Date  . Allergy   . Anemia    "chronic"  . Angina   . Anxiety and depression   . Arthritis   . Asthma   . Atrial fibrillation ([place])    h/o "AF w/frequent PVCs"  . Cancer (HCC)    hx of skin cancer   . CHF (congestive heart failure) ([place])   . Chronic back pain greater than 3 months duration    on chronic narcotics, treated at pain clinic  . Colon polyps    hyperplastic  . Coronary artery disease    Arrythmia, orthostatic hypotension, HLD, HTN; sees Dr. Einar Gip  . Difficult intubation    "TMJ & woke up when they were still cutting on me"  . Dysrhythmia    sees Dr. Einar Gip and a cardiologist at [facility]  . Esophageal stricture   . Fatty liver   . Fibroids   . Fibromyalgia    "in my legs"  . GERD (gastroesophageal reflux disease)    hx hiatal hernia, stricture and gastric ulcer  . Headache(784.0)   . Heart murmur   . Hiatal hernia   . History of loop recorder   . History of migraines    "dx'd when I was in my teens"  . Hyperlipemia   . Hypertension   . Mental disorder   . Mild episode of recurrent major depressive disorder ([place]) 12/06/2015  . Myocardial infarction [facility]) 1980's & 1990;   sees Dr. Einar Gip  . OSA (obstructive sleep apnea)   . Pneumonia   . PONV  (postoperative nausea and vomiting)   . Recurrent upper respiratory infection (URI)   . Shortness of breath 11/20/11   "all the time", sees pulmonlogy, ? asthma  . Stenotic cervical os   . Stomach ulcer    "3 small; found in 05/2011"  . TMJ (dislocation of temporomandibular joint)   . Tuberculosis    + TB SKIN TEST  . Type 2 diabetes mellitus without complication, without long-term current use of insulin ([place]) 12/06/2015   not on meds     Past Surgical History:  Procedure Laterality Date  . ACHILLES TENDON REPAIR  1970's   left ankle  . ARTHROSCOPIC REPAIR ACL     left knee cap  . BREAST BIOPSY Right 04/08/2013  . CARDIAC CATHETERIZATION     loop recorder  . CARPAL TUNNEL RELEASE  unknown   left hand  . ESOPHAGOGASTRODUODENOSCOPY (EGD) WITH PROPOFOL N/A 10/29/2017   Procedure: ESOPHAGOGASTRODUODENOSCOPY (EGD) WITH PROPOFOL;  Surgeon: Irene Shipper, MD;  Location: [facility];  Service: Endoscopy;  Laterality: N/A;  . LOOP RECORDER IMPLANT    . post ganglionectomy  1970's   "  for migraine headaches"  . pouch string  [phone]   "did this 3 times (once w/each pregnancy)"  . SAVORY DILATION N/A 10/29/2017   Procedure: SAVORY DILATION;  Surgeon: Irene Shipper, MD;  Location: [facility];  Service: Endoscopy;  Laterality: N/A;  . TOTAL KNEE ARTHROPLASTY Left 09/25/2016   Procedure: LEFT TOTAL KNEE ARTHROPLASTY;  Surgeon: Paralee Cancel, MD;  Location: [facility];  Service: Orthopedics;  Laterality: Left;  . TUBAL LIGATION  1980's    There were no vitals filed for this visit.   Subjective Assessment - 09/09/19 1412    Subjective  Patient reports she is here today to be seen by her medical team for her newly diagnosed left breast cancer.    Pertinent History  Patient was diagnosed on 08/25/2019 with left grade III invasive ductal carcinoma breast cancer. It measures 2.1 cm and is located in the upper outer quadrant. It is triple negative with a Ki67 of 70%.    Patient Stated Goals  Reduce  lymphedema risk and learn post op shoulder ROM HEP    Currently in Pain?  Yes    Pain Score  6     Pain Location  Back    Pain Orientation  Lower    Pain Descriptors / Indicators  Throbbing;Stabbing    Pain Type  Chronic pain    Pain Radiating Towards  bilateral legs    Pain Onset  More than a month ago    Pain Frequency  Intermittent    Aggravating Factors   Prolonged sitting    Pain Relieving Factors  Moving around    Multiple Pain Sites  No         [facility] PT Assessment - 09/09/19 0001      Assessment   Medical Diagnosis  Left breast cancer    Referring Provider (PT)  Dr. Rolm Bookbinder    Onset Date/Surgical Date  08/25/19    Hand Dominance  Right    Prior Therapy  none      Precautions   Precautions  Other (comment)    Precaution Comments  active cancer      Restrictions   Weight Bearing Restrictions  No      Balance Screen   Has the patient fallen in the past 6 months  No    Has the patient had a decrease in activity level because of a fear of falling?   No    Is the patient reluctant to leave their home because of a fear of falling?   No      Home Film/video editor residence    Living Arrangements  Spouse/significant other;Other relatives   Husband and 2 grandchildren ages 60 and 95   Available Help at Discharge  Family      Prior Function   Level of Friendly  Retired    Leisure  She walks 3x/week for 15 min      Cognition   Overall Cognitive Status  Within Functional Limits for tasks assessed      Posture/Postural Control   Posture/Postural Control  Postural limitations    Postural Limitations  Rounded Shoulders;Forward head      ROM / Strength   AROM / PROM / Strength  AROM;Strength      AROM   AROM Assessment Site  Shoulder;Cervical    Right/Left Shoulder  Right;Left    Right Shoulder Extension  40 Degrees    Right Shoulder  Flexion  134 Degrees    Right Shoulder ABduction  144 Degrees     Right Shoulder Internal Rotation  67 Degrees    Right Shoulder External Rotation  73 Degrees    Left Shoulder Extension  45 Degrees    Left Shoulder Flexion  139 Degrees    Left Shoulder ABduction  140 Degrees    Left Shoulder Internal Rotation  70 Degrees    Left Shoulder External Rotation  78 Degrees    Cervical Flexion  WNL    Cervical Extension  50% limited    Cervical - Right Side Bend  25% limited     Cervical - Left Side Bend  25% limited     Cervical - Right Rotation  25% limited     Cervical - Left Rotation  50% limited      Strength   Overall Strength  Within functional limits for tasks performed        LYMPHEDEMA/ONCOLOGY QUESTIONNAIRE - 09/09/19 1418      Type   Cancer Type  Left breast cancer      Lymphedema Assessments   Lymphedema Assessments  Upper extremities      Right Upper Extremity Lymphedema   10 cm Proximal to Olecranon Process  36.1 cm    Olecranon Process  29.8 cm    10 cm Proximal to Ulnar Styloid Process  23.8 cm    Just Proximal to Ulnar Styloid Process  16.8 cm    Across Hand at [employer]  19.4 cm    At [place] of 2nd Digit  6.1 cm      Left Upper Extremity Lymphedema   10 cm Proximal to Olecranon Process  37.3 cm    Olecranon Process  30.6 cm    10 cm Proximal to Ulnar Styloid Process  22.4 cm    Just Proximal to Ulnar Styloid Process  16.7 cm    Across Hand at [employer]  19.8 cm    At [place] of 2nd Digit  5.9 cm             Objective measurements completed on examination: See above findings.     Patient was instructed today in a home exercise program today for post op shoulder range of motion. These included active assist shoulder flexion in sitting, scapular retraction, wall walking with shoulder abduction, and hands behind head external rotation.  She was encouraged to do these twice a day, holding 3 seconds and repeating 5 times when permitted by her physician.        PT Education - 09/09/19 1419    Education  Details  Lymphedema risk reduction and post op shoulder ROM HEP    Person(s) Educated  Patient    Methods  Explanation;Demonstration;Handout    Comprehension  Returned demonstration;Verbalized understanding           Breast Clinic Goals - 09/09/19 1444      Patient will be able to verbalize understanding of pertinent lymphedema risk reduction practices relevant to her diagnosis specifically related to skin care.   Time  1    Period  Days    Status  Achieved      Patient will be able to return demonstrate and/or verbalize understanding of the post-op home exercise program related to regaining shoulder range of motion.   Time  1    Period  Days    Status  Achieved      Patient will be able to verbalize understanding  of the importance of attending the postoperative After Breast Cancer Class for further lymphedema risk reduction education and therapeutic exercise.   Time  1    Period  Days    Status  Achieved            Plan - 09/09/19 1421    Clinical Impression Statement  Patient was diagnosed on 08/25/2019 with left grade III invasive ductal carcinoma breast cancer. It measures 2.1 cm and is located in the upper outer quadrant. It is triple negative with a Ki67 of 70%. Her multidisciplinary medical team met prior to her assessments to determine a recommended treatment plan. She is planning to have neoadjuvant chemotherapy followed by a left lumpectomy and sentinel node biopsy and radiation. She will benefit from a post op PT reassessment to detemine needs.    Personal Factors and Comorbidities  Social Background   Primary caregiver for her 2 grandchildren   Stability/Clinical Decision Making  Stable/Uncomplicated    Clinical Decision Making  Low    Rehab Potential  Excellent    PT Frequency  One time visit    PT Treatment/Interventions  ADLs/Self Care Home Management;Therapeutic exercise;Patient/family education    PT Next Visit Plan  Will reassess post op if MD refers pt     PT Home Exercise Plan  Post op shoulder ROM HEP    Consulted and Agree with Plan of Care  Patient       Patient will benefit from skilled therapeutic intervention in order to improve the following deficits and impairments:  Decreased knowledge of precautions, Impaired UE functional use, Pain, Postural dysfunction, Decreased range of motion  Visit Diagnosis: Malignant neoplasm of upper-outer quadrant of left breast in female, estrogen receptor negative ([place]) - Plan: PT plan of care cert/re-cert  Abnormal posture - Plan: PT plan of care cert/re-cert   Patient will follow up at outpatient cancer rehab 3-4 weeks following surgery.  If the patient requires physical therapy at that time, a specific plan will be dictated and sent to the referring physician for approval. The patient was educated today on appropriate basic range of motion exercises to begin post operatively and the importance of attending the After Breast Cancer class following surgery.  Patient was educated today on lymphedema risk reduction practices as it pertains to recommendations that will benefit the patient immediately following surgery.  She verbalized good understanding.      Problem List Patient Active Problem List   Diagnosis Date Noted  . Malignant neoplasm of upper-outer quadrant of right breast in female, estrogen receptor negative ([place]) 09/07/2019  . Encounter for loop recorder at end of battery life 04/24/2018  . Esophageal stricture 07/01/2017  . Hyperlipidemia associated with type 2 diabetes mellitus ([place]) 05/20/2017  . Allergic rhinitis 03/11/2017  . Dilated cardiomyopathy ([place]) 02/07/2017  . 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 Morbid obesity due to excess calories ([place]) 09/26/2016  . S/P left TKA 09/25/2016  . Increased endometrial stripe thickness 06/27/2016  . Intramural leiomyoma of uterus 06/27/2016  . Type 2 diabetes mellitus without complication, without long-term current  use of insulin ([place]) 12/06/2015  . Chronic respiratory failure ([place]) 09/15/2015  . Dyspnea 09/01/2015  . Hypertension associated with diabetes ([place]) 07/19/2015  . Arrhythmia 07/19/2015  . Orthostatic hypotension 07/19/2015  . GERD (gastroesophageal reflux disease) 07/19/2015  . Chronic back pain 07/19/2015  . Neuropathy 07/19/2015  . Symptomatic PVCs 11/02/2014  . Syncope 11/02/2014  . Chest pain 12/23/2013  . Disorder of  cervix 03/10/2013  . Vaginal atrophy 03/10/2013  . OSA (obstructive sleep apnea) 07/31/2012  . Coronary artery disease 11/20/2011   Annia Friendly, PT 09/09/19 2:50 PM  [place], [place], [address] Phone: [phone]   Fax:  [phone]  Name: Shelby Anderson MRN: [id number] Date of Birth: 1956-05-11

## 2019-09-09 NOTE — Progress Notes (Signed)
REFERRING PROVIDER: Nicholas Lose, MD [place],  [place] [id number]  PRIMARY PROVIDER:  Lucretia Kern, DO  PRIMARY REASON FOR VISIT:  1. Malignant neoplasm of upper-outer quadrant of right breast in female, estrogen receptor negative ([place])   2. Family history of melanoma   3. Family history of pancreatic cancer      HISTORY OF PRESENT ILLNESS:   I connected with Shelby Anderson on 09/09/2019 at 2:19 EDT by Webex video conference and verified that I am speaking with the correct person using two identifiers.   Patient location: [facility] Provider location: Office  Shelby Anderson, a 63 y.o. female, was seen for a [facility] cancer genetics consultation at the request of Dr. Lindi Adie due to a personal and family history of cancer.  Shelby Anderson presents to clinic today to discuss the possibility of a hereditary predisposition to cancer, genetic testing, and to further clarify her future cancer risks, as well as potential cancer risks for family members.   In October 2020, at the age of 45, Shelby Anderson was diagnosed with cancer of the right breast. The treatment plan neoadjuvant chemotherapy and lumpectomy and radiation.    CANCER HISTORY:  Oncology History  Malignant neoplasm of upper-outer quadrant of right breast in female, estrogen receptor negative ([place])  09/07/2019 Initial Diagnosis   Routine screening mammogram detected a 2.1cm mass in the left breast and no left axillary adenopathy. 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      RISK FACTORS:  Menarche was at age 65.  First live birth at age 49.  Ovaries intact: yes.  Hysterectomy: no.  Menopausal status: postmenopausal.  HRT use: 1 years. Colonoscopy: yes; abnormal. Mammogram within the last year: yes. Number of breast biopsies: 2. Up to date with pelvic exams: yes. Any excessive radiation exposure in the past: no  Past Medical History:  Diagnosis Date   . Allergy   . Anemia    "chronic"  . Angina   . Anxiety and depression   . Arthritis   . Asthma   . Atrial fibrillation ([place])    h/o "AF w/frequent PVCs"  . Cancer (HCC)    hx of skin cancer   . CHF (congestive heart failure) ([place])   . Chronic back pain greater than 3 months duration    on chronic narcotics, treated at pain clinic  . Colon polyps    hyperplastic  . Coronary artery disease    Arrythmia, orthostatic hypotension, HLD, HTN; sees Dr. Einar Gip  . Difficult intubation    "TMJ & woke up when they were still cutting on me"  . Dysrhythmia    sees Dr. Einar Gip and a [facility]ologist at [facility]  . Esophageal stricture   . Family history of melanoma ]  . Family history of pancreatic cancer   . Fatty liver   . Fibroids   . Fibromyalgia    "in my legs"  . GERD (gastroesophageal reflux disease)    hx hiatal hernia, stricture and gastric ulcer  . Headache(784.0)   . Heart murmur   . Hiatal hernia   . History of loop recorder   . History of migraines    "dx'd when I was in my teens"  . Hyperlipemia   . Hypertension   . Mental disorder   . Mild episode of recurrent major depressive disorder ([place]) 12/06/2015  . Myocardial infarction [facility]) 1980's & 1990;   sees Dr. Einar Gip  . OSA (  obstructive sleep apnea)   . Pneumonia   . PONV (postoperative nausea and vomiting)   . Recurrent upper respiratory infection (URI)   . Shortness of breath 11/20/11   "all the time", sees pulmonlogy, ? asthma  . Stenotic cervical os   . Stomach ulcer    "3 small; found in 05/2011"  . TMJ (dislocation of temporomandibular joint)   . Tuberculosis    + TB SKIN TEST  . Type 2 diabetes mellitus without complication, without long-term current use of insulin ([place]) 12/06/2015   not on meds     Past Surgical History:  Procedure Laterality Date  . ACHILLES TENDON REPAIR  1970's   left ankle  . ARTHROSCOPIC REPAIR ACL     left knee cap  . BREAST BIOPSY Right 04/08/2013  . CARDIAC CATHETERIZATION      loop recorder  . CARPAL TUNNEL RELEASE  unknown   left hand  . ESOPHAGOGASTRODUODENOSCOPY (EGD) WITH PROPOFOL N/A 10/29/2017   Procedure: ESOPHAGOGASTRODUODENOSCOPY (EGD) WITH PROPOFOL;  Surgeon: Irene Shipper, MD;  Location: [facility] ENDOSCOPY;  Service: Endoscopy;  Laterality: N/A;  . LOOP RECORDER IMPLANT    . post ganglionectomy  1970's   "for migraine headaches"  . pouch string  [phone]   "did this 3 times (once w/each pregnancy)"  . SAVORY DILATION N/A 10/29/2017   Procedure: SAVORY DILATION;  Surgeon: Irene Shipper, MD;  Location: [facility];  Service: Endoscopy;  Laterality: N/A;  . TOTAL KNEE ARTHROPLASTY Left 09/25/2016   Procedure: LEFT TOTAL KNEE ARTHROPLASTY;  Surgeon: Paralee Cancel, MD;  Location: [facility] OR[facility];  Service: Orthopedics;  Laterality: Left;  . TUBAL LIGATION  1980's    Social History   Socioeconomic History  . Marital status: Married    Spouse name: Not on file  . Number of children: 3  . Years of education: Not on file  . Highest education level: Not on file  Occupational History  . Occupation: Retired Animal nutritionist  . Financial resource strain: Not on file  . Food insecurity    Worry: Not on file    Inability: Not on file  . Transportation needs    Medical: Not on file    Non-medical: Not on file  Tobacco Use  . Smoking status: Never Smoker  . Smokeless tobacco: Never Used  Substance and Sexual Activity  . Alcohol use: No    Alcohol/week: 0.0 standard drinks  . Drug use: No  . Sexual activity: Not Currently    Birth control/protection: Surgical    Comment: 1st intercourse 63 yo-Fewer than 5 partners  Lifestyle  . Physical activity    Days per week: Not on file    Minutes per session: Not on file  . Stress: Not on file  Relationships  . Social Herbalist on phone: Not on file    Gets together: Not on file    Attends religious service: Not on file    Active member of club or organization: Not on file    Attends meetings of clubs or  organizations: Not on file    Relationship status: Not on file  Other Topics Concern  . Not on file  Social History Narrative  . Not on file     FAMILY HISTORY:  We obtained a detailed, 4-generation family history.  Significant diagnoses are listed below: Family History  Problem Relation Age of Onset  . Malignant hyperthermia Father   . Hypertension Father   . Heart disease Father   .  Diabetes Father   . Cancer Father        skin  . Hypertension Mother   . Heart disease Mother   . Multiple myeloma Mother   . Cancer Sister        CERVICAL  . Hypertension Sister   . Cancer Brother 22       MELANOMA  . Heart disease Maternal Grandmother   . Other Maternal Grandmother 32       complications of childbirth  . Heart disease Maternal Grandfather   . Cancer Paternal Grandmother        ?   Marland Kitchen Heart disease Paternal Grandmother   . Heart disease Paternal Grandfather   . Cancer Brother        LUNG  . Diabetes Sister   . Hypertension Sister   . Heart disease Sister   . Cancer Sister   . Cancer Brother   . Pancreatic cancer Niece 60  . Cancer Nephew 40       unknown- currently in the [employer]  . Anesthesia problems Neg Hx   . Hypotension Neg Hx   . Pseudochol deficiency Neg Hx   . Colon cancer Neg Hx   . Esophageal cancer Neg Hx   . Rectal cancer Neg Hx   . Stomach cancer Neg Hx   . Breast cancer Neg Hx     The patient has three sons who are cancer free.  She has three brothers and four sisters.  One brother died at 21 from [place], one brother died at 65 from lung cancer.  A sister had cervical cancer at 28, and another sister has a daughter with pancreatic cancer at age 55.  A third sister has a son who has an unknown cancer at age 49.  The patient's parents are deceased.  The patient's mother had multiple myeloma and died at 75.  She had two brothers who are deceased from non cancer related issues.  The maternal grandparents are deceased.  One from stroke and the other  from childbirth.  The patient's father died of heart problems.  He had a paternal half sister who is deceased.  The paternal grandparents are deceased from non cancer related issues.  Shelby Anderson is unaware of previous family history of genetic testing for hereditary cancer risks. Patient's maternal ancestors are of [place] descent, and paternal ancestors are of Caucasian descent. There is no reported Ashkenazi Jewish ancestry. There is no known consanguinity.    GENETIC COUNSELING ASSESSMENT: Shelby Anderson is a 63 y.o. female with a personal and family history of cancer which is somewhat suggestive of a hereditary cancer syndrome and predisposition to cancer given her family history of melanoma, pancreatic cancer and personal history of breast cancer.. We, therefore, discussed and recommended the following at today's visit.   DISCUSSION: We discussed that 5 - 10% of breast cancer is hereditary, with most cases associated with BRCA mutations.  There are other genes that can be associated with hereditary breast cancer syndromes. Based on the family history of melanoma and pancreatic cancer we are also concerned about CDKN2A mutations.   We discussed that testing is beneficial for several reasons including knowing how to follow individuals after completing their treatment, identifying whether potential treatment options such as PARP inhibitors would be beneficial, and understand if other family members could be at risk for cancer and allow them to undergo genetic testing.   We reviewed the characteristics, features and inheritance patterns of hereditary cancer syndromes. We  also discussed genetic testing, including the appropriate family members to test, the process of testing, insurance coverage and turn-around-time for results. We discussed the implications of a negative, positive, carrier and/or variant of uncertain significant result. We recommended Shelby Anderson pursue genetic  testing for the common hereditary cancer gene panel. The Common Hereditary Gene Panel offered by [facility] includes sequencing and/or deletion duplication testing of the following 48 genes: APC, ATM, AXIN2, BARD1, BMPR1A, BRCA1, BRCA2, BRIP1, CDH1, CDK4, CDKN2A (p14ARF), CDKN2A (p16INK4a), CHEK2, CTNNA1, DICER1, EPCAM (Deletion/duplication testing only), GREM1 (promoter region deletion/duplication testing only), KIT, MEN1, MLH1, MSH2, MSH3, MSH6, MUTYH, NBN, NF1, NHTL1, PALB2, PDGFRA, PMS2, POLD1, POLE, PTEN, RAD50, RAD51C, RAD51D, RNF43, SDHB, SDHC, SDHD, SMAD4, SMARCA4. STK11, TP53, TSC1, TSC2, and VHL.  The following genes were evaluated for sequence changes only: SDHA and HOXB13 c.251G>A variant only.   Based on Shelby Anderson personal and family history of cancer, she meets medical criteria for genetic testing. Despite that she meets criteria, she may still have an out of pocket cost. We discussed that if her out of pocket cost for testing is over $100, the laboratory will call and confirm whether she wants to proceed with testing.  If the out of pocket cost of testing is less than $100 she will be billed by the genetic testing laboratory.   PLAN: After considering the risks, benefits, and limitations, Shelby Anderson provided informed consent to pursue genetic testing and the blood sample was sent to [facility] for analysis of the common hereditary cancer panel. Results should be available within approximately 2-3 weeks' time, at which point they will be disclosed by telephone to Shelby Anderson, as will any additional recommendations warranted by these results. Shelby Anderson will receive a summary of her genetic counseling visit and a copy of her results once available. This information will also be available in Epic.   Lastly, we encouraged Shelby Anderson to remain in contact with cancer genetics annually so that we can continuously update the family history and inform her of any changes  in cancer genetics and testing that may be of benefit for this family.   Shelby Anderson questions were answered to her satisfaction today. Our contact information was provided should additional questions or concerns arise. Thank you for the referral and allowing [place] to share in the care of your patient.   Jaydeen Odor P. Florene Glen, [place], [facility] Licensed, Insurance risk surveyor [email or web address] phone: [phone]  The patient was seen for a total of 35 minutes in face-to-face genetic counseling.  This patient was discussed with Drs. Magrinat, Lindi Adie and/or Burr Medico who agrees with the above.    _______________________________________________________________________ For Office Staff:  Number of people involved in session: 1 Was an Intern/ student involved with case: no

## 2019-09-09 NOTE — Progress Notes (Signed)
Radiation Oncology         [phone] ________________________________  Name: Shelby Anderson        MRN: [id number]  Date of Service: 09/09/2019 DOB: 1956/02/26  CC:Kim, Nickola Major, DO  Rolm Bookbinder, MD     REFERRING PHYSICIAN: Rolm Bookbinder, MD   DIAGNOSIS: The encounter diagnosis was Malignant neoplasm of upper-outer quadrant of right breast in female, estrogen receptor negative ([place]).   HISTORY OF PRESENT ILLNESS: Shelby Anderson is a 63 y.o. female seen in the multidisciplinary breast clinic for a new diagnosis of left breast cancer. The patient was noted to have a screening detected mass and distortion in the left breast.  She underwent diagnostic imaging which revealed a mass at 1:00 measuring 2.1 x 1.1 x 1 cm.  Her axilla was negative for adenopathy.  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  Her tumor was triple negative with a Ki-67 of 70%.  She is seen today in breast clinic to discuss treatment recommendations for her cancer.    PREVIOUS RADIATION THERAPY: No   PAST MEDICAL HISTORY:  Past Medical History:  Diagnosis Date   Allergy    Anemia    "chronic"   Angina    Anxiety and depression    Arthritis    Asthma    Atrial fibrillation ([place])    h/o "AF w/frequent PVCs"   Cancer ([place])    hx of skin cancer    CHF (congestive heart failure) (HCC)    Chronic back pain greater than 3 months duration    on chronic narcotics, treated at pain clinic   Colon polyps    hyperplastic   Coronary artery disease    Arrythmia, orthostatic hypotension, HLD, HTN; sees Dr. Einar Gip   Difficult intubation    "TMJ & woke up when they were still cutting on me"   Dysrhythmia    sees Dr. Einar Gip and a cardiologist at [facility] health   Esophageal stricture    Fatty liver    Fibroids    Fibromyalgia    "in my legs"   GERD (gastroesophageal reflux disease)    hx hiatal hernia, stricture and gastric ulcer    Headache(784.0)    Heart murmur    Hiatal hernia    History of loop recorder    History of migraines    "dx'd when I was in my teens"   Hyperlipemia    Hypertension    Mental disorder    Mild episode of recurrent major depressive disorder ([place]) 12/06/2015   Myocardial infarction [facility]) 1980's & 1990;   sees Dr. Einar Gip   OSA (obstructive sleep apnea)    Pneumonia    PONV (postoperative nausea and vomiting)    Recurrent upper respiratory infection (URI)    Shortness of breath 11/20/11   "all the time", sees pulmonlogy, ? asthma   Stenotic cervical os    Stomach ulcer    "3 small; found in 05/2011"   TMJ (dislocation of temporomandibular joint)    Tuberculosis    + TB SKIN TEST   Type 2 diabetes mellitus without complication, without long-term current use of insulin ([place]) 12/06/2015   not on meds        PAST SURGICAL HISTORY: Past Surgical History:  Procedure Laterality Date   ACHILLES TENDON REPAIR  1970's   left ankle   ARTHROSCOPIC REPAIR ACL     left knee cap   BREAST BIOPSY Right 04/08/2013  CARDIAC CATHETERIZATION     loop recorder   CARPAL TUNNEL RELEASE  unknown   left hand   ESOPHAGOGASTRODUODENOSCOPY (EGD) WITH PROPOFOL N/A 10/29/2017   Procedure: ESOPHAGOGASTRODUODENOSCOPY (EGD) WITH PROPOFOL;  Surgeon: Irene Shipper, MD;  Location: [facility];  Service: Endoscopy;  Laterality: N/A;   LOOP RECORDER IMPLANT     post ganglionectomy  1970's   "for migraine headaches"   pouch string  79,81,83   "did this 3 times (once w/each pregnancy)"   SAVORY DILATION N/A 10/29/2017   Procedure: SAVORY DILATION;  Surgeon: Irene Shipper, MD;  Location: [facility];  Service: Endoscopy;  Laterality: N/A;   TOTAL KNEE ARTHROPLASTY Left 09/25/2016   Procedure: LEFT TOTAL KNEE ARTHROPLASTY;  Surgeon: Paralee Cancel, MD;  Location: [facility];  Service: Orthopedics;  Laterality: Left;   TUBAL LIGATION  1980's     FAMILY HISTORY:  Family History    Problem Relation Age of Onset   Malignant hyperthermia Father    Hypertension Father    Heart disease Father    Diabetes Father    Cancer Father        skin   Hypertension Mother    Heart disease Mother    Cancer Sister        CERVICAL   Hypertension Sister    Cancer Brother        MELANOMA   Heart disease Maternal Grandmother    Heart disease Maternal Grandfather    Cancer Paternal Grandmother        ?    Heart disease Paternal Grandmother    Heart disease Paternal Grandfather    Cancer Brother        LUNG   Diabetes Sister    Hypertension Sister    Heart disease Sister    Cancer Sister    Cancer Brother    Anesthesia problems Neg Hx    Hypotension Neg Hx    Pseudochol deficiency Neg Hx    Colon cancer Neg Hx    Esophageal cancer Neg Hx    Pancreatic cancer Neg Hx    Rectal cancer Neg Hx    Stomach cancer Neg Hx    Breast cancer Neg Hx      SOCIAL HISTORY:  reports that she has never smoked. She has never used smokeless tobacco. She reports that she does not drink alcohol or use drugs.  The patient is married and lives in [place]. 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   ALLERGIES: Lodine [etodolac], Oxycontin [oxycodone hcl], Penicillins, Aspirin, Darvocet [propoxyphene n-acetaminophen], Nitroglycerin, Tramadol, Ultram [tramadol hcl], and Valium   MEDICATIONS:  Current Outpatient Medications  Medication Sig Dispense Refill   albuterol (PROVENTIL) (2.5 MG/3ML) 0.083% nebulizer solution Take 3 mLs (2.5 mg total) by nebulization every 6 (six) hours as needed for wheezing or shortness of breath (J45.40). 75 mL 3   albuterol (VENTOLIN HFA) 108 (90 Base) MCG/ACT inhaler Inhale 2 puffs into the lungs every 6 (six) hours as needed for wheezing or shortness of breath. 3 g 3   aspirin EC 81 MG tablet Take 81 mg at bedtime by mouth.      atorvastatin (LIPITOR) 40 MG tablet TAKE 1 TABLET BY MOUTH EVERY  DAY 90 tablet 0   budesonide-formoterol (SYMBICORT) 80-4.5 MCG/ACT inhaler INHALE 2 PUFFS INTO THE LUNGS EVERY MORNING AND ANOTHER 2 PUFFS 12 HOURS LATER 30.6 Inhaler 1   CINNAMON PO Take 1,000 mg 2 (two) times daily by  mouth.     doxepin (SINEQUAN) 10 MG capsule TAKE 1 CAPSULE BY MOUTH EVERYDAY AT BEDTIME     esomeprazole (NEXIUM) 40 MG capsule Take 1 capsule (40 mg total) by mouth 2 (two) times daily. 180 capsule 3   fluticasone (FLONASE) 50 MCG/ACT nasal spray Place 2 sprays at bedtime as needed into both nostrils for allergies or rhinitis.      glipiZIDE (GLUCOTROL XL) 5 MG 24 hr tablet TAKE 1 TABLET BY MOUTH EVERY DAY WITH BREAKFAST 90 tablet 3   isosorbide mononitrate (IMDUR) 60 MG 24 hr tablet TAKE 1 TABLET BY MOUTH EVERY DAY 90 tablet 1   L-Methylfolate (DEPLIN) 7.5 MG TABS Take 7.5 mg by mouth daily with breakfast.      LYRICA 150 MG capsule TAKE 1 CAPSULE BY MOUTH EVERY 8 HOURS AS DIRECTED  2   metFORMIN (GLUCOPHAGE) 500 MG tablet Take 1 tablet (500 mg total) by mouth 2 (two) times daily with a meal. 180 tablet 1   metoprolol tartrate (LOPRESSOR) 50 MG tablet TAKE 1 TABLET TWICE A DAY 180 tablet 1   Misc Natural Products (GLUCOS-CHONDROIT-MSM COMPLEX PO) Take 1 tablet 3 (three) times daily by mouth.      mometasone (ELOCON) 0.1 % cream Apply 1 application topically 2 (two) times daily as needed.      montelukast (SINGULAIR) 10 MG tablet TAKE 1 TABLET BY MOUTH EVERYDAY AT BEDTIME 90 tablet 1   Multiple Vitamin (MULITIVITAMIN WITH MINERALS) TABS Take 1 tablet by mouth daily.       nitroGLYCERIN (NITROSTAT) 0.3 MG SL tablet Place 0.3 mg under the tongue every 5 (five) minutes as needed for chest pain (as needed).     ondansetron (ZOFRAN) 4 MG tablet Take 1 tablet (4 mg total) by mouth every 4 (four) hours as needed for nausea or vomiting. 90 tablet 3   Oxycodone HCl 20 MG TABS Take 20 mg every 4 (four) hours as needed by mouth (pain).      OXYGEN Inhale 2 L into the lungs  at bedtime.     potassium chloride (K-DUR) 10 MEQ tablet TAKE 2 TABLETS BY MOUTH EVERY DAY 180 tablet 1   PROAIR HFA 108 (90 Base) MCG/ACT inhaler INHALE 2 PUFFS INTO THE LUNGS EVERY 4 HOURS AS NEEDED FOR SHORTNESS OF BREATH 8.5 Inhaler 3   promethazine (PHENERGAN) 25 MG tablet Take 1 tablet (25 mg total) by mouth every 6 (six) hours as needed for nausea or vomiting. 90 tablet 3   sucralfate (CARAFATE) 1 g tablet Take 1 tablet (1 g total) by mouth 4 (four) times daily -  with meals and at bedtime. Before taking slowly dissolve tablet in 1 Tablespoon of distilled water for about 15 minutes to creat a slurry. 360 tablet 3   tiZANidine (ZANAFLEX) 4 MG tablet Take 4 mg by mouth every 8 (eight) hours as needed for muscle spasms.     vitamin B-12 (CYANOCOBALAMIN) 500 MCG tablet Take 500 mcg by mouth daily.      No current facility-administered medications for this encounter.      REVIEW OF SYSTEMS: On review of systems, the patient reports that she is doing well overall. She denies any chest pain, shortness of breath, cough, fevers, chills, night sweats, unintended weight changes. She denies any bowel or bladder disturbances, and denies abdominal pain, nausea or vomiting. She denies any new musculoskeletal or joint aches or pains. A complete review of systems is obtained and is otherwise negative.  PHYSICAL EXAM:  Wt Readings from Last 3 Encounters:  09/09/19 229 lb 12.8 oz (104.2 kg)  08/19/19 235 lb 2 oz (106.7 kg)  07/16/19 234 lb 3.2 oz (106.2 kg)   Temp Readings from Last 3 Encounters:  09/09/19 98.9 F (37.2 C) (Temporal)  08/19/19 98.8 F (37.1 C) (Oral)  07/16/19 98.4 F (36.9 C) (Oral)   BP Readings from Last 3 Encounters:  09/09/19 126/83  08/19/19 122/80  07/16/19 118/74   Pulse Readings from Last 3 Encounters:  09/09/19 63  08/19/19 60  07/16/19 65   In general this is a well appearing caucasian female in no acute distress. She's alert and oriented x4 and  appropriate throughout the examination. Cardiopulmonary assessment is negative for acute distress and she exhibits normal effort. Breast exam is deferred.    ECOG = 0  0 - Asymptomatic (Fully active, able to carry on all predisease activities without restriction)  1 - Symptomatic but completely ambulatory (Restricted in physically strenuous activity but ambulatory and able to carry out work of a light or sedentary nature. For example, light housework, office work)  2 - Symptomatic, <50% in bed during the day (Ambulatory and capable of all self care but unable to carry out any work activities. Up and about more than 50% of waking hours)  3 - Symptomatic, >50% in bed, but not bedbound (Capable of only limited self-care, confined to bed or chair 50% or more of waking hours)  4 - Bedbound (Completely disabled. Cannot carry on any self-care. Totally confined to bed or chair)  5 - Death   Eustace Pen MM, Creech RH, Tormey DC, et al. [phone]). "Toxicity and response criteria of the [facility]". [place] Oncol. 5 (6): 649-55    LABORATORY DATA:  Lab Results  Component Value Date   WBC 7.3 09/09/2019   HGB 13.8 09/09/2019   HCT 44.1 09/09/2019   MCV 91.3 09/09/2019   PLT 264 09/09/2019   Lab Results  Component Value Date   NA 142 04/10/2019   K 4.0 04/10/2019   CL 101 04/10/2019   CO2 34 (H) 04/10/2019   Lab Results  Component Value Date   ALT 21 03/30/2015   AST 29 03/30/2015   ALKPHOS 79 03/30/2015   BILITOT 0.4 03/30/2015      RADIOGRAPHY: US Breast Ltd Uni Left Inc Axilla  Result Date: 08/31/2019 CLINICAL DATA:  Recall from screening mammography with tomosynthesis, possible mass or focal asymmetry associated with subtle architectural distortion involving the UPPER OUTER LEFT breast at MIDDLE to POSTERIOR depth. EXAM: DIGITAL DIAGNOSTIC LEFT MAMMOGRAM WITH TOMO ULTRASOUND LEFT BREAST COMPARISON:  Previous exam(s). ACR Breast Density Category b: There are  scattered areas of fibroglandular density. FINDINGS: Tomosynthesis and synthesized spot-compression CC and MLO views of the area of concern in the LEFT breast were obtained. The focal asymmetry questioned in the OUTER LEFT breast at POSTERIOR depth partially disperses though persists with compression. There is no visible underlying mass. There may be persistent vague architectural distortion. On correlative physical exam, there is palpable thickening in the UPPER OUTER QUADRANT of the LEFT breast. Targeted LEFT breast ultrasound is performed, showing a heterogeneous though predominantly hypoechoic mass with vague margins at the 1 o'clock position approximately 4 cm from the nipple measuring approximately 1.0 x 1.1 x 2.1 cm, demonstrating posterior acoustic shadowing and no internal power Doppler flow, associated with scattered calcifications, corresponding to the screening mammographic finding. Sonographic evaluation of the LEFT axilla demonstrates no pathologic  lymphadenopathy. IMPRESSION: 1. Suspicious approximate 2.1 cm mass involving the UPPER OUTER QUADRANT of the LEFT breast which accounts for the screening mammographic finding. 2. No pathologic LEFT axillary lymphadenopathy. RECOMMENDATION: Ultrasound-guided core needle biopsy of the LEFT breast mass. The ultrasound core needle biopsy procedure was discussed with the patient and her questions were answered. She has agreed to proceed and the biopsy has been scheduled for Wednesday, September 30. I have discussed the findings and recommendations with the patient. BI-RADS CATEGORY  4: Suspicious. Electronically Signed   By: Evangeline Dakin M.D.   On: 08/31/2019 15:43   Mm Diag Breast Tomo Uni Left  Result Date: 08/31/2019 CLINICAL DATA:  Recall from screening mammography with tomosynthesis, possible mass or focal asymmetry associated with subtle architectural distortion involving the UPPER OUTER LEFT breast at MIDDLE to POSTERIOR depth. EXAM: DIGITAL  DIAGNOSTIC LEFT MAMMOGRAM WITH TOMO ULTRASOUND LEFT BREAST COMPARISON:  Previous exam(s). ACR Breast Density Category b: There are scattered areas of fibroglandular density. FINDINGS: Tomosynthesis and synthesized spot-compression CC and MLO views of the area of concern in the LEFT breast were obtained. The focal asymmetry questioned in the OUTER LEFT breast at POSTERIOR depth partially disperses though persists with compression. There is no visible underlying mass. There may be persistent vague architectural distortion. On correlative physical exam, there is palpable thickening in the UPPER OUTER QUADRANT of the LEFT breast. Targeted LEFT breast ultrasound is performed, showing a heterogeneous though predominantly hypoechoic mass with vague margins at the 1 o'clock position approximately 4 cm from the nipple measuring approximately 1.0 x 1.1 x 2.1 cm, demonstrating posterior acoustic shadowing and no internal power Doppler flow, associated with scattered calcifications, corresponding to the screening mammographic finding. Sonographic evaluation of the LEFT axilla demonstrates no pathologic lymphadenopathy. IMPRESSION: 1. Suspicious approximate 2.1 cm mass involving the UPPER OUTER QUADRANT of the LEFT breast which accounts for the screening mammographic finding. 2. No pathologic LEFT axillary lymphadenopathy. RECOMMENDATION: Ultrasound-guided core needle biopsy of the LEFT breast mass. The ultrasound core needle biopsy procedure was discussed with the patient and her questions were answered. She has agreed to proceed and the biopsy has been scheduled for Wednesday, September 30. I have discussed the findings and recommendations with the patient. BI-RADS CATEGORY  4: Suspicious. Electronically Signed   By: Evangeline Dakin M.D.   On: 08/31/2019 15:43   Mm 3d Screen Breast Bilateral  Result Date: 08/26/2019 CLINICAL DATA:  Screening. EXAM: DIGITAL SCREENING BILATERAL MAMMOGRAM WITH TOMO AND CAD COMPARISON:   Previous exam(s). ACR Breast Density Category b: There are scattered areas of fibroglandular density. FINDINGS: In the left breast, possible distortion/mass warrants further evaluation. In the right breast, no findings suspicious for malignancy. Images were processed with CAD. IMPRESSION: Further evaluation is suggested for possible distortion/mass in the left breast. RECOMMENDATION: Diagnostic mammogram and possibly ultrasound of the left breast. (Code:[id number]) The patient will be contacted regarding the findings, and additional imaging will be scheduled. BI-RADS CATEGORY  0: Incomplete. Need additional imaging evaluation and/or prior mammograms for comparison. Electronically Signed   By: Everlean Alstrom M.D.   On: 08/26/2019 12:58   Mm Clip Placement Left  Result Date: 09/02/2019 CLINICAL DATA:  Status post ultrasound-guided core needle biopsy of a 2.1 cm mass in the 1 o'clock position of the left breast. EXAM: DIAGNOSTIC LEFT MAMMOGRAM POST ULTRASOUND BIOPSY COMPARISON:  Previous exam(s). FINDINGS: Mammographic images were obtained following ultrasound guided biopsy of the recently demonstrated 2.1 cm mass in the 1 o'clock position of the left breast.  These demonstrate a ribbon shaped biopsy marker clip in the biopsied mass. IMPRESSION: Appropriate clip deployment following left breast ultrasound-guided core needle biopsy. Final Assessment: Post Procedure Mammograms for Marker Placement Electronically Signed   By: Claudie Revering M.D.   On: 09/02/2019 11:41   [place] Lt Breast Bx W Loc Dev 1st Lesion Img Bx Spec US Guide  Addendum Date: 09/03/2019   ADDENDUM REPORT: 09/03/2019 13:38 ADDENDUM: Pathology revealed GRADE III INVASIVE DUCTAL CARCINOMA, DUCTAL CARCINOMA IN SITU of Left breast, 1 o'clock. This was found to be concordant by Dr. Claudie Revering. Pathology results were discussed with the patient by telephone. The patient reported doing well after the biopsy with tenderness at the site. Post biopsy  instructions and care were reviewed and questions were answered. The patient was encouraged to call [facility] for any additional concerns. The patient was referred to [facility] on September 09, 2019. Pathology results reported by Terie Purser, RN on 09/03/2019. Electronically Signed   By: Claudie Revering M.D.   On: 09/03/2019 13:38   Result Date: 09/03/2019 CLINICAL DATA:  2.1 cm mass in the 1 o'clock position of the left breast with recent imaging findings suspicious for malignancy. EXAM: ULTRASOUND GUIDED LEFT BREAST CORE NEEDLE BIOPSY COMPARISON:  Previous exam(s). FINDINGS: I met with the patient and we discussed the procedure of ultrasound-guided biopsy, including benefits and alternatives. We discussed the high likelihood of a successful procedure. We discussed the risks of the procedure, including infection, bleeding, tissue injury, clip migration, and inadequate sampling. Informed written consent was given. The usual time-out protocol was performed immediately prior to the procedure. Lesion quadrant: Upper outer quadrant Using sterile technique and 1% Lidocaine as local anesthetic, under direct ultrasound visualization, a 12 gauge spring-loaded device was used to perform biopsy of the recently demonstrated 2.1 cm mass in the 1 o'clock position of the left breast, 4 cm from the nipple, using a lateral approach. At the conclusion of the procedure ribbon shaped tissue marker clip was deployed into the biopsy cavity. Follow up 2 view mammogram was performed and dictated separately. IMPRESSION: Ultrasound guided biopsy of the recently demonstrated 2.1 cm mass in the 1 o'clock position of the left breast. No apparent complications. Electronically Signed: By: Claudie Revering M.D. On: 09/02/2019 11:18       IMPRESSION/PLAN: 1. 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 Dr. Lisbeth Renshaw discusses the pathology findings and reviews the nature of triple negative left breast disease. The consensus from the breast conference includes neoadjuvant chemotherapy, followed by surgical resection. She appears to be a good candidate for lumpectomy and sentinel node biopsy followed by adjuvant radiotherapy to the left breast with deep inspiration breath hold technique. She is however possibly interested in a mastectomy. We discussed the risks, benefits, short, and long term effects of radiotherapy, and the patient may proceed depending on the type of surgery she chooses. Dr. Lisbeth Renshaw discusses the delivery and logistics of radiotherapy and anticipates a course of 4-6 1/2 weeks of radiotherapy. We will see her back about 2 weeks after surgery to discuss the simulation process and anticipate we starting radiotherapy about 4-6 weeks after surgery.   2. Indwelling Loop Recorder. The patient's loop recorder is in situ and she reports Dr. Norville Haggard checks her recorder q3 months. Apparently the battery has not been actively recording for about a year but it was recommended  that she keep her device and have the battery replaced if she became symptomatic. 3. Possible genetic predisposition to malignancy. The patient is a candidate for genetic testing given her personal and family history. She was offered referral and is interested in meeting with the genetics team. This will be coordinated in the near future.  In a visit lasting 30 minutes, greater than 50% of the time was spent face to face discussing her case, and coordinating the patient's care.  The above documentation reflects my direct findings during this shared patient visit. Please see the separate note by Dr. Lisbeth Renshaw on this date for the remainder of the patient's plan of care.    Carola Rhine, PAC

## 2019-09-09 NOTE — Assessment & Plan Note (Addendum)
09/07/2019:Routine screening mammogram detected a 2.1cm mass in the left breast and no left axillary adenopathy. 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  Pathology and radiology counseling:Discussed with the patient, the details of pathology including the type of breast cancer,the clinical staging, the significance of ER, PR and HER-2/neu receptors and the implications for treatment. After reviewing the pathology in detail, we proceeded to discuss the different treatment options between surgery, radiation, chemotherapy, antiestrogen therapies.  Recommendations: 1.  Neoadjuvant chemotherapy with dose dense indomethacin and Cytoxan followed by Taxol and carboplatin 2. Breast conserving surgery followed by 3. Adjuvant radiation therapy   Chemotherapy Counseling: I discussed the risks and benefits of chemotherapy including the risks of nausea/ vomiting, risk of infection from low WBC count, fatigue due to chemo or anemia, bruising or bleeding due to low platelets, mouth sores, loss/ change in taste and decreased appetite. Liver and kidney function will be monitored through out chemotherapy as abnormalities in liver and kidney function may be a side effect of treatment. Cardiac dysfunction due to Adriamycin and neuropathy due to Taxol were discussed in detail. Risk of permanent bone marrow dysfunction and leukemia due to chemo were also discussed.  Plan: Follow-up, echocardiogram, chemo class, participation in SWOG 1714 clinical trial

## 2019-09-09 NOTE — Addendum Note (Signed)
Encounter addended by: Hayden Pedro, PA-C on: 09/09/2019 2:33 PM  Actions taken: Level of Service modified, Visit diagnoses modified

## 2019-09-10 ENCOUNTER — Other Ambulatory Visit: Payer: Self-pay | Admitting: Hematology and Oncology

## 2019-09-10 ENCOUNTER — Telehealth: Payer: Self-pay | Admitting: Hematology and Oncology

## 2019-09-10 DIAGNOSIS — C50411 Malignant neoplasm of upper-outer quadrant of right female breast: Secondary | ICD-10-CM

## 2019-09-10 DIAGNOSIS — Z171 Estrogen receptor negative status [ER-]: Secondary | ICD-10-CM

## 2019-09-10 MED ORDER — LORAZEPAM 0.5 MG PO TABS: 0.5000 mg | ORAL_TABLET | Freq: Every evening | ORAL | 0 refills | Status: DC | PRN

## 2019-09-10 MED ORDER — ONDANSETRON HCL 8 MG PO TABS: 8.0000 mg | ORAL_TABLET | Freq: Two times a day (BID) | ORAL | 1 refills | Status: DC | PRN

## 2019-09-10 MED ORDER — LIDOCAINE-PRILOCAINE 2.5-2.5 % EX CREA: TOPICAL_CREAM | CUTANEOUS | 3 refills | Status: DC

## 2019-09-10 MED ORDER — PROCHLORPERAZINE MALEATE 10 MG PO TABS: 10.0000 mg | ORAL_TABLET | Freq: Four times a day (QID) | ORAL | 1 refills | Status: DC | PRN

## 2019-09-10 NOTE — Progress Notes (Signed)
[place] Psychosocial Distress Screening Spiritual Care  Met with Carlisha in [facility] to introduce [place] team/resources, reviewing distress screen per protocol.  The patient scored a 6 on the Psychosocial Distress Thermometer which indicates moderate distress. Also assessed for distress and other psychosocial needs.   ONCBCN DISTRESS SCREENING 09/10/2019  Screening Type Initial Screening  Distress experienced in past week (1-10) 6  Practical problem type Food  Family Problem type Partner;Children;Other (comment)  Emotional problem type Depression;Adjusting to illness;Boredom  Information Concerns Type Lack of info about diagnosis;Lack of info about treatment;Lack of info about complementary therapy choices;Lack of info about maintaining fitness  Physical Problem type Pain;Nausea/vomiting;Sleep/insomnia;Breathing;Mouth sores/swallowing;Loss of appetitie;Tingling hands/feet;Skin dry/itchy;Swollen arms/legs  Referral to support programs Yes   One of Lashonna's top concerns is how to talk to and support her grandchildren, of whom she has custody, through her cancer treatment. Provided spiritual and emotional support related to this distress, as well as information about helping children cope, including encouragement to contact [place] for additional support. She is interested in [employer], including ideas from [employer] that she might be able to replicate at home with her grandchildren.   Follow up needed: Yes.   Ellicia is mostly still in the numb/surreal phase of processing, so we plan to f/u by phone and/or in infusion for further support and opportunity to work through the feelings and concerns that arise over time. Will also refer to Abigail Elmore/LCSW re exploring financial resources related to food scarcity, meds, and other concerns. Please also page if immediate needs arise. Thank you!   [place], [place], [facility] Pager  [phone] Voicemail [phone]

## 2019-09-10 NOTE — Progress Notes (Signed)
START OFF PATHWAY REGIMEN - Breast   OFF00004:Docetaxel + Cyclophosphamide (TC):   A cycle is every 21 days:     Docetaxel      Cyclophosphamide   **Always confirm dose/schedule in your pharmacy ordering system**  Patient Characteristics: Preoperative or Nonsurgical Candidate (Clinical Staging), Neoadjuvant Therapy followed by Surgery, Invasive Disease, Chemotherapy, HER2 Negative/Unknown/Equivocal, ER Negative/Unknown, Platinum Therapy Indicated Therapeutic Status: Preoperative or Nonsurgical Candidate (Clinical Staging) AJCC M Category: cM0 AJCC Grade: G3 Breast Surgical Plan: Neoadjuvant Therapy followed by Surgery ER Status: Negative (-) AJCC 8 Stage Grouping: IIB HER2 Status: Negative (-) AJCC T Category: cT2 AJCC N Category: cN0 PR Status: Negative (-) Type of Therapy: Platinum Therapy Indicated Intent of Therapy: Curative Intent, Discussed with Patient

## 2019-09-10 NOTE — Telephone Encounter (Signed)
Scheduled appt per 10/8 sch message - pt husband took the message that appts were added and pt will be getting a new schedule on 10/13 at chemo edu

## 2019-09-10 NOTE — Telephone Encounter (Signed)
No 10/7 LOS  

## 2019-09-11 ENCOUNTER — Other Ambulatory Visit: Payer: Self-pay | Admitting: Family Medicine

## 2019-09-14 ENCOUNTER — Encounter: Payer: Self-pay | Admitting: Neurology

## 2019-09-14 ENCOUNTER — Ambulatory Visit: Payer: Medicare Other | Admitting: Neurology

## 2019-09-14 ENCOUNTER — Other Ambulatory Visit: Payer: Self-pay

## 2019-09-14 VITALS — BP 130/78 | HR 98 | Ht 66.0 in | Wt 234.0 lb

## 2019-09-14 DIAGNOSIS — R42 Dizziness and giddiness: Secondary | ICD-10-CM

## 2019-09-14 DIAGNOSIS — M542 Cervicalgia: Secondary | ICD-10-CM | POA: Diagnosis not present

## 2019-09-14 NOTE — Patient Instructions (Signed)
Start neck physiotherapy and vestibular therapy  Start meclizine 25mg  twice daily for dizziness  Return to clinic in 3 months

## 2019-09-14 NOTE — Progress Notes (Signed)
[facility] Clinic Note - Initial Visit   Date: 09/14/19  Shelby Anderson MRN: [id number] DOB: May 28, 1956   Dear Dr. [name]:  Thank you for your kind referral of Shelby Anderson for consultation of vertigo. Although her history is well known to you, please allow [place] to reiterate it for the purpose of our medical record. The patient was accompanied to the clinic by self   History of Present Illness: Shelby Anderson is a 64 y.o. right-handed female with asthma, pulmonary hypertension, atrial fibrillation, diabetes mellitus, depression/anxiety, hyperlipidemia, left breast cancer (2020) presenting for evaluation of dizziness and neck pain.   She suffered a fall down 6 steps in May 2020 because her knee gave-way and she landed on her back of her head.  A few days later, she developed dizziness and nausea which is triggered by neck flexion.  She also complains of achy neck discomfort at the base of the head.  She has not done physical therapy for vestibular therapy.  She sees pain management who was offered muscle relaxants and she also takes oxycodone, which does not provide any relief.   She was recently diagnosed with left breast cancer and will be starting chemotherapy followed by radiation.  Out-side paper records, electronic medical record, and images have been reviewed where available and summarized as:  Lab Results  Component Value Date   HGBA1C 7.3 (H) 04/10/2019   Lab Results  Component Value Date   VITAMINB12 1,020 (H) 04/10/2019   Lab Results  Component Value Date   TSH 1.88 04/10/2016   No results found for: ESRSEDRATE, POCTSEDRATE  Past Medical History:  Diagnosis Date  . Allergy   . Anemia    "chronic"  . Angina   . Anxiety and depression   . Arthritis   . Asthma   . Atrial fibrillation ([place])    h/o "AF w/frequent PVCs"  . Cancer (HCC)    hx of skin cancer   . CHF (congestive heart failure) ([place])   . Chronic back pain  greater than 3 months duration    on chronic narcotics, treated at pain clinic  . Colon polyps    hyperplastic  . Coronary artery disease    Arrythmia, orthostatic hypotension, HLD, HTN; sees Dr. Einar Gip  . Difficult intubation    "TMJ & woke up when they were still cutting on me"  . Dysrhythmia    sees Dr. Einar Gip and a [facility]ologist at [facility]  . Esophageal stricture   . Family history of melanoma ]  . Family history of pancreatic cancer   . Fatty liver   . Fibroids   . Fibromyalgia    "in my legs"  . GERD (gastroesophageal reflux disease)    hx hiatal hernia, stricture and gastric ulcer  . Headache(784.0)   . Heart murmur   . Hiatal hernia   . History of loop recorder   . History of migraines    "dx'd when I was in my teens"  . Hyperlipemia   . Hypertension   . Mental disorder   . Mild episode of recurrent major depressive disorder ([place]) 12/06/2015  . Myocardial infarction [facility]) 1980's & 1990;   sees Dr. Einar Gip  . OSA (obstructive sleep apnea)   . Pneumonia   . PONV (postoperative nausea and vomiting)   . Recurrent upper respiratory infection (URI)   . Shortness of breath 11/20/11   "all the time", sees pulmonlogy, ? asthma  . Stenotic cervical os   .  Stomach ulcer    "3 small; found in 05/2011"  . TMJ (dislocation of temporomandibular joint)   . Tuberculosis    + TB SKIN TEST  . Type 2 diabetes mellitus without complication, without long-term current use of insulin ([place]) 12/06/2015   not on meds     Past Surgical History:  Procedure Laterality Date  . ACHILLES TENDON REPAIR  1970's   left ankle  . ARTHROSCOPIC REPAIR ACL     left knee cap  . BREAST BIOPSY Right 04/08/2013  . CARDIAC CATHETERIZATION     loop recorder  . CARPAL TUNNEL RELEASE  unknown   left hand  . ESOPHAGOGASTRODUODENOSCOPY (EGD) WITH PROPOFOL N/A 10/29/2017   Procedure: ESOPHAGOGASTRODUODENOSCOPY (EGD) WITH PROPOFOL;  Surgeon: Irene Shipper, MD;  Location: [facility] ENDOSCOPY;  Service: Endoscopy;   Laterality: N/A;  . LOOP RECORDER IMPLANT    . post ganglionectomy  1970's   "for migraine headaches"  . pouch string  [phone]   "did this 3 times (once w/each pregnancy)"  . SAVORY DILATION N/A 10/29/2017   Procedure: SAVORY DILATION;  Surgeon: Irene Shipper, MD;  Location: [facility];  Service: Endoscopy;  Laterality: N/A;  . TOTAL KNEE ARTHROPLASTY Left 09/25/2016   Procedure: LEFT TOTAL KNEE ARTHROPLASTY;  Surgeon: Paralee Cancel, MD;  Location: [facility] ORS;  Service: Orthopedics;  Laterality: Left;  . TUBAL LIGATION  1980's     Medications:  Outpatient Encounter Medications as of 09/14/2019  Medication Sig  . albuterol (PROVENTIL) (2.5 MG/3ML) 0.083% nebulizer solution Take 3 mLs (2.5 mg total) by nebulization every 6 (six) hours as needed for wheezing or shortness of breath (J45.40).  Marland Kitchen albuterol (VENTOLIN HFA) 108 (90 Base) MCG/ACT inhaler Inhale 2 puffs into the lungs every 6 (six) hours as needed for wheezing or shortness of breath.  Marland Kitchen aspirin EC 81 MG tablet Take 81 mg at bedtime by mouth.   Marland Kitchen atorvastatin (LIPITOR) 40 MG tablet TAKE 1 TABLET BY MOUTH EVERY DAY (Patient taking differently: Take 40 mg by mouth at bedtime. )  . budesonide-formoterol (SYMBICORT) 80-4.5 MCG/ACT inhaler INHALE 2 PUFFS INTO THE LUNGS EVERY MORNING AND ANOTHER 2 PUFFS 12 HOURS LATER (Patient taking differently: Inhale 2 puffs into the lungs 2 (two) times daily. )  . CINNAMON PO Take 1,000 mg 2 (two) times daily by mouth.  . doxepin (SINEQUAN) 10 MG capsule Take 20 mg by mouth at bedtime.   Marland Kitchen esomeprazole (NEXIUM) 40 MG capsule Take 1 capsule (40 mg total) by mouth 2 (two) times daily.  . fluticasone (FLONASE) 50 MCG/ACT nasal spray Place 2 sprays at bedtime as needed into both nostrils for allergies or rhinitis.   Marland Kitchen glipiZIDE (GLUCOTROL XL) 5 MG 24 hr tablet TAKE 1 TABLET BY MOUTH EVERY DAY WITH BREAKFAST (Patient taking differently: Take 5 mg by mouth daily with breakfast. )  . isosorbide mononitrate  (IMDUR) 60 MG 24 hr tablet TAKE 1 TABLET BY MOUTH EVERY DAY (Patient taking differently: Take 60 mg by mouth daily. )  . L-Methylfolate (DEPLIN) 7.5 MG TABS Take 7.5 mg by mouth daily with breakfast.   . lidocaine-prilocaine (EMLA) cream Apply to affected area once (Patient taking differently: Apply 1 application topically daily as needed (numbing). )  . LORazepam (ATIVAN) 0.5 MG tablet Take 1 tablet (0.5 mg total) by mouth at bedtime as needed for sleep.  Marland Kitchen LYRICA 150 MG capsule Take 150 mg by mouth 3 (three) times daily.   . metFORMIN (GLUCOPHAGE) 500 MG tablet Take 1 tablet (  500 mg total) by mouth 2 (two) times daily with a meal.  . metoprolol tartrate (LOPRESSOR) 50 MG tablet TAKE 1 TABLET BY MOUTH TWICE A DAY (Patient taking differently: Take 50 mg by mouth 2 (two) times daily. )  . Misc Natural Products (GLUCOS-CHONDROIT-MSM COMPLEX PO) Take 1 tablet 3 (three) times daily by mouth.   . Multiple Vitamin (MULITIVITAMIN WITH MINERALS) TABS Take 1 tablet by mouth daily.    . nitroGLYCERIN (NITROSTAT) 0.4 MG SL tablet Place 0.4 mg under the tongue every 5 (five) minutes as needed for chest pain.   Marland Kitchen ondansetron (ZOFRAN) 4 MG tablet Take 1 tablet (4 mg total) by mouth every 4 (four) hours as needed for nausea or vomiting.  . ondansetron (ZOFRAN) 8 MG tablet Take 1 tablet (8 mg total) by mouth 2 (two) times daily as needed for refractory nausea / vomiting. Start on day 3 after chemo.  Marland Kitchen Oxycodone HCl 20 MG TABS Take 20 mg every 4 (four) hours as needed by mouth (pain).   . OXYGEN Inhale 2 L into the lungs at bedtime as needed (shortness of breath).   . potassium chloride (K-DUR) 10 MEQ tablet TAKE 2 TABLETS BY MOUTH EVERY DAY (Patient taking differently: Take 20 mEq by mouth daily. )  . PROAIR HFA 108 (90 Base) MCG/ACT inhaler INHALE 2 PUFFS INTO THE LUNGS EVERY 4 HOURS AS NEEDED FOR SHORTNESS OF BREATH  . prochlorperazine (COMPAZINE) 10 MG tablet Take 1 tablet (10 mg total) by mouth every 6 (six)  hours as needed (Nausea or vomiting).  . promethazine (PHENERGAN) 25 MG tablet Take 1 tablet (25 mg total) by mouth every 6 (six) hours as needed for nausea or vomiting.  . sucralfate (CARAFATE) 1 g tablet Take 1 tablet (1 g total) by mouth 4 (four) times daily -  with meals and at bedtime. Before taking slowly dissolve tablet in 1 Tablespoon of distilled water for about 15 minutes to creat a slurry.  Marland Kitchen tiZANidine (ZANAFLEX) 4 MG tablet Take 4 mg by mouth every 8 (eight) hours as needed for muscle spasms.  . vitamin B-12 (CYANOCOBALAMIN) 500 MCG tablet Take 500 mcg by mouth daily.   . mometasone (ELOCON) 0.1 % cream Apply 1 application topically daily as needed (irritation).   . montelukast (SINGULAIR) 10 MG tablet TAKE 1 TABLET BY MOUTH EVERYDAY AT BEDTIME (Patient taking differently: Take 10 mg by mouth at bedtime. )   No facility-administered encounter medications on file as of 09/14/2019.     Allergies:  Allergies  Allergen Reactions  . Lodine [Etodolac] Anaphylaxis, Hives and Swelling  . Oxycontin [Oxycodone Hcl] Anaphylaxis    hives, trouble breathing, tongue swelling (Only Oxycontin) Tolerates plain oxycodone.  Marland Kitchen Penicillins Anaphylaxis    Told by a surgeon never to take it again. Has patient had a PCN reaction causing immediate rash, facial/tongue/throat swelling, SOB or lightheadedness with hypotension: Yes Has patient had a PCN reaction causing severe rash involving mucus membranes or skin necrosis: Unknown Has patient had a PCN reaction that required hospitalization: No Has patient had a PCN reaction occurring within the last 10 years: No If all of the above answers are "NO", then may proceed with Cephalosporin use.  . Aspirin Other (See Comments)    High-dose caused GI Bleeds  . Darvocet [Propoxyphene N-Acetaminophen] Hives  . Nitroglycerin     IV-BP drops dramatically  . Tramadol Hives and Itching  . Ultram [Tramadol Hcl] Hives  . Valium Other (See Comments)  Circulation problems. "Legs turned black".    Family History: Family History  Problem Relation Age of Onset  . Malignant hyperthermia Father   . Hypertension Father   . Heart disease Father   . Diabetes Father   . Cancer Father        skin  . Hypertension Mother   . Heart disease Mother   . Multiple myeloma Mother   . Cancer Sister        CERVICAL  . Hypertension Sister   . Cancer Brother 22       MELANOMA  . Heart disease Maternal Grandmother   . Other Maternal Grandmother 32       complications of childbirth  . Heart disease Maternal Grandfather   . Cancer Paternal Grandmother        ?   Marland Kitchen Heart disease Paternal Grandmother   . Heart disease Paternal Grandfather   . Cancer Brother        LUNG  . Diabetes Sister   . Hypertension Sister   . Heart disease Sister   . Cancer Sister   . Cancer Brother   . Pancreatic cancer Niece 6  . Cancer Nephew 40       unknown- currently in the [employer]  . Anesthesia problems Neg Hx   . Hypotension Neg Hx   . Pseudochol deficiency Neg Hx   . Colon cancer Neg Hx   . Esophageal cancer Neg Hx   . Rectal cancer Neg Hx   . Stomach cancer Neg Hx   . Breast cancer Neg Hx     Social History: Social History   Tobacco Use  . Smoking status: Never Smoker  . Smokeless tobacco: Never Used  Substance Use Topics  . Alcohol use: No    Alcohol/week: 0.0 standard drinks  . Drug use: No   Social History   Social History Narrative   Right handed   One story home   3 children    Review of Systems:  CONSTITUTIONAL: No fevers, chills, night sweats, or weight loss.   EYES: No visual changes or eye pain ENT: No hearing changes.  No history of nose bleeds.   RESPIRATORY: No cough, wheezing and shortness of breath.   CARDIOVASCULAR: Negative for chest pain, and palpitations.   GI: Negative for abdominal discomfort, blood in stools or black stools.  No recent change in bowel habits.   GU:  No history of incontinence.   MUSCLOSKELETAL:  No history of joint pain or swelling.  +myalgias.   SKIN: Negative for lesions, rash, and itching.   HEMATOLOGY/ONCOLOGY: Negative for prolonged bleeding, bruising easily, and swollen nodes.  +history of cancer.   ENDOCRINE: Negative for cold or heat intolerance, polydipsia or goiter.   PSYCH:  +depression or anxiety symptoms.   NEURO: As Above.   Vital Signs:  BP 130/78   Pulse 98   Ht _0  (1.676 m)   Wt 234 lb (106.1 kg)   SpO2 98%   BMI 37.77 kg/m    General Medical Exam:   General:  Tired appearing, comfortable.   Eyes/ENT: see cranial nerve examination.   Neck:   No carotid bruits. Respiratory:  Clear to auscultation, good air entry bilaterally.   Cardiac:  Regular rate and rhythm, no murmur.   Extremities:  No deformities, edema, or skin discoloration.  Skin:  No rashes or lesions.  Neurological Exam: MENTAL STATUS including orientation to time, place, person, recent and remote memory, attention span and concentration, language, and  fund of knowledge is normal.  Speech is not dysarthric.  CRANIAL NERVES: II:  No visual field defects.   III-IV-VI: Pupils equal round and reactive to light.  Normal conjugate, extra-ocular eye movements in all directions of gaze.  Mild nystagmus with end gaze at the left.  No ptosis.   V:  Normal facial sensation.    VII:  Normal facial symmetry and movements.   VIII:  Normal hearing and vestibular function.   IX-X:  Normal palatal movement.   XI:  Normal shoulder shrug and head rotation.   XII:  Normal tongue strength and range of motion, no deviation or fasciculation.  MOTOR:  No atrophy, fasciculations or abnormal movements.  No pronator drift.   Upper Extremity:  Right  Left  Deltoid  5/5   5/5   Biceps  5/5   5/5   Triceps  5/5   5/5   Infraspinatus 5/5  5/5  Medial pectoralis 5/5  5/5  Wrist extensors  5/5   5/5   Wrist flexors  5/5   5/5   Finger extensors  5/5   5/5   Finger flexors  5/5   5/5   Dorsal interossei  5/5    5/5   Abductor pollicis  5/5   5/5   Tone (Ashworth scale)  0  0   Lower Extremity:  Right  Left  Hip flexors  5/5   5/5   Hip extensors  5/5   5/5   Adductor 5/5  5/5  Abductor 5/5  5/5  Knee flexors  5/5   5/5   Knee extensors  5/5   5/5   Dorsiflexors  5/5   5/5   Plantarflexors  5/5   5/5   Toe extensors  5/5   5/5   Toe flexors  5/5   5/5   Tone (Ashworth scale)  0  0   MSRs:  Right        Left                  brachioradialis 2+  2+  biceps 2+  2+  triceps 2+  2+  patellar 2+  2+  ankle jerk 1+  1+  Hoffman no  no  plantar response down  down   SENSORY: Reduced vibration at the ankles bilaterally.    COORDINATION/GAIT: Normal finger-to- nose-finger.  Intact rapid alternating movements bilaterally.   Gait narrow based and stable. Tandem and stressed gait intact.    IMPRESSION: BPPV + cervicalgia vs. cervicogenic dizziness  - Start vestibular and neck PT  - Start meclizine 12.81m BID  - Continue muscle relaxants as prescribed by pain management  Return to clinic in 3 months.   Thank you for allowing me to participate in patient's care.  If I can answer any additional questions, I would be pleased to do so.    Sincerely,    Dezi Schaner K. PPosey Pronto DO

## 2019-09-15 ENCOUNTER — Encounter: Payer: Self-pay | Admitting: *Deleted

## 2019-09-15 ENCOUNTER — Inpatient Hospital Stay: Payer: Medicare Other

## 2019-09-15 ENCOUNTER — Encounter: Payer: Self-pay | Admitting: Hematology and Oncology

## 2019-09-15 ENCOUNTER — Other Ambulatory Visit: Payer: Self-pay

## 2019-09-15 NOTE — Progress Notes (Signed)
Met with patient in waiting room to introduce myself as Arboriculturist and to offer available resources.  Discussed one-time $1000 Radio broadcast assistant to assist with personal expenses while going through treatment. Advised what is needed to apply. She verbalized understanding.  Asked if she has met her ded/OOP for insurance. She states she is not sure. Advised if she wishes to contact her insurance company to find out and if she hasn't, she can may be able to apply for copay assistance for treatment drugs if available.  She has my card if interested in applying and for any additional financial questions or concerns.

## 2019-09-15 NOTE — Progress Notes (Signed)
[place] Work  Clinical Social Work received referral from [employer] for financial resources and support information.  CSW contacted patient at home to offer support and assess for needs.  Patient stated her primary concern was financial stress from additional medical bills.  Patient and her husband are the primary caregivers for their 2 young grandchildren that live in the home.  Patient stated they have experienced increased financial need since they "adopted" their grandchildren.  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  CSW provided education on the support team and support service at [facility].  CSW and patient discussed financial resources and application.  CSW left a packet with applications for [employer] [place] [place] in chemo education for patient.  CSW also provided a bag from the [employer].  CSW provided contact information encouraged patient to call with questions or concerns.        Johnnye Lana, MSW, LCSW, OSW-C Clinical Social Worker [facility] [phone]

## 2019-09-16 ENCOUNTER — Other Ambulatory Visit: Payer: Self-pay | Admitting: Family Medicine

## 2019-09-16 NOTE — Pre-Procedure Instructions (Signed)
[id number] [place], [place] - [address] [place] [address] Phone: [phone] Fax: [phone]      Your procedure is scheduled on  09-23-19  Report to [facility] Main Entrance "A" at 11A.M., and check in at the Admitting office.  Call this number if you have problems the morning of surgery:  [phone]  Call [phone] if you have any questions prior to your surgery date Monday-Friday 8am-4pm    Remember:  Do not eat  after midnight the night before your surgery  You may drink clear liquids until 10AM the morning of your surgery.   Clear liquids allowed are: Water, Non-Citrus Juices (without pulp), Carbonated Beverages, Clear Tea, Black Coffee Only, and Gatorade  Please complete your PRE-SURGERY G2that was provided to you by  10AM the morning of surgery.  Please, if able, drink it in one setting. DO NOT SIP.    Take these medicines the morning of surgery with A SIP OF WATER : atorvastatin (LIPITOR)  budesonide-formoterol (SYMBICORT) esomeprazole (NEXIUM) isosorbide mononitrate (IMDUR)  LYRICA metoprolol tartrate (LOPRESSOR)  albuterol (PROVENTIL)as needed nitroGLYCERIN (NITROSTAT)as needed  ondansetron (ZOFRAN)as needed Oxycodone HCl as needed PROAIR HFA as needed tiZANidine (ZANAFLEX) as needed  7 days prior to surgery STOP taking any Aspirin (unless otherwise instructed by your surgeon), Aleve, Naproxen, Ibuprofen, Motrin, Advil, Goody's, BC's, all herbal medications, fish oil, and all vitamins.   WHAT DO I DO ABOUT MY DIABETES MEDICATION?   Marland Kitchen Do not take oral diabetes medicines (pills) including METFORMIN and GLIPIZIDE the morning of surgery.   How to Manage Your Diabetes Before and After Surgery  Why is it important to control my blood sugar before and after surgery? . Improving blood sugar levels before and after surgery helps healing and can limit problems. . A way of improving  blood sugar control is eating a healthy diet by: o  Eating less sugar and carbohydrates o  Increasing activity/exercise o  Talking with your doctor about reaching your blood sugar goals . High blood sugars (greater than 180 mg/dL) can raise your risk of infections and slow your recovery, so you will need to focus on controlling your diabetes during the weeks before surgery. . Make sure that the doctor who takes care of your diabetes knows about your planned surgery including the date and location.  How do I manage my blood sugar before surgery? . Check your blood sugar at least 4 times a day, starting 2 days before surgery, to make sure that the level is not too high or low. o Check your blood sugar the morning of your surgery when you wake up and every 2 hours until you get to the Short Stay unit. . If your blood sugar is less than 70 mg/dL, you will need to treat for low blood sugar: o Do not take insulin. o Treat a low blood sugar (less than 70 mg/dL) with  cup of clear juice (cranberry or apple), 4 glucose tablets, OR glucose gel. o Recheck blood sugar in 15 minutes after treatment (to make sure it is greater than 70 mg/dL). If your blood sugar is not greater than 70 mg/dL on recheck, call [phone] for further instructions. . Report your blood sugar to the short stay nurse when you get to Short Stay.  . If you are admitted to the hospital after surgery: o Your blood sugar will be checked by the staff and you will probably be given  insulin after surgery (instead of oral diabetes medicines) to make sure you have good blood sugar levels. o The goal for blood sugar control after surgery is 80-180 mg/dL.    The Morning of Surgery  Do not wear jewelry, make-up or nail polish.  Do not wear lotions, powders, or perfumes, or deodorant  Do not shave 48 hours prior to surgery.    Do not bring valuables to the hospital.  [facility] is not responsible for any belongings or valuables.  If you  are a smoker, DO NOT Smoke 24 hours prior to surgery IF you wear a CPAP at night please bring your mask, tubing, and machine the morning of surgery   Remember that you must have someone to transport you home after your surgery, and remain with you for 24 hours if you are discharged the same day.   Contacts, glasses, hearing aids, dentures or bridgework may not be worn into surgery.    Leave your suitcase in the car.  After surgery it may be brought to your room.  For patients admitted to the hospital, discharge time will be determined by your treatment team.  Patients discharged the day of surgery will not be allowed to drive home.    Special instructions:   [place]- Preparing For Surgery  Before surgery, you can play an important role. Because skin is not sterile, your skin needs to be as free of germs as possible. You can reduce the number of germs on your skin by washing with CHG (chlorahexidine gluconate) Soap before surgery.  CHG is an antiseptic cleaner which kills germs and bonds with the skin to continue killing germs even after washing.    Oral Hygiene is also important to reduce your risk of infection.  Remember - BRUSH YOUR TEETH THE MORNING OF SURGERY WITH YOUR REGULAR TOOTHPASTE  Please do not use if you have an allergy to CHG or antibacterial soaps. If your skin becomes reddened/irritated stop using the CHG.  Do not shave (including legs and underarms) for at least 48 hours prior to first CHG shower. It is OK to shave your face.  Please follow these instructions carefully.   1. Shower the NIGHT BEFORE SURGERY and the MORNING OF SURGERY with CHG Soap.   2. If you chose to wash your hair, wash your hair first as usual with your normal shampoo.  3. After you shampoo, rinse your hair and body thoroughly to remove the shampoo.  4. Use CHG as you would any other liquid soap. You can apply CHG directly to the skin and wash gently with a scrungie or a clean washcloth.    5. Apply the CHG Soap to your body ONLY FROM THE NECK DOWN.  Do not use on open wounds or open sores. Avoid contact with your eyes, ears, mouth and genitals (private parts). Wash Face and genitals (private parts)  with your normal soap.   6. Wash thoroughly, paying special attention to the area where your surgery will be performed.  7. Thoroughly rinse your body with warm water from the neck down.  8. DO NOT shower/wash with your normal soap after using and rinsing off the CHG Soap.  9. Pat yourself dry with a CLEAN TOWEL.  10. Wear CLEAN PAJAMAS to bed the night before surgery, wear comfortable clothes the morning of surgery  11. Place CLEAN SHEETS on your bed the night of your first shower and DO NOT SLEEP WITH PETS.  Day of Surgery:  Do not  apply any deodorants/lotions. Please shower the morning of surgery with the CHG soap  Please wear clean clothes to the hospital/surgery center.   Remember to brush your teeth WITH YOUR REGULAR TOOTHPASTE.   Please read over the fact sheets that you were given.

## 2019-09-17 ENCOUNTER — Other Ambulatory Visit: Payer: Self-pay

## 2019-09-17 ENCOUNTER — Encounter (HOSPITAL_COMMUNITY)
Admission: RE | Admit: 2019-09-17 | Discharge: 2019-09-17 | Disposition: A | Discharge status: Home / Self Care | Payer: Medicare Other | Source: Ambulatory Visit | Attending: General Surgery | Admitting: General Surgery

## 2019-09-17 ENCOUNTER — Ambulatory Visit: Payer: Self-pay | Admitting: Genetic Counselor

## 2019-09-17 ENCOUNTER — Encounter: Payer: Self-pay | Admitting: Cardiology

## 2019-09-17 ENCOUNTER — Encounter: Payer: Self-pay | Admitting: Genetic Counselor

## 2019-09-17 ENCOUNTER — Telehealth: Payer: Self-pay | Admitting: Genetic Counselor

## 2019-09-17 ENCOUNTER — Telehealth: Payer: Self-pay | Admitting: *Deleted

## 2019-09-17 ENCOUNTER — Encounter (HOSPITAL_COMMUNITY): Payer: Self-pay

## 2019-09-17 DIAGNOSIS — Z01818 Encounter for other preprocedural examination: Secondary | ICD-10-CM | POA: Diagnosis not present

## 2019-09-17 DIAGNOSIS — M47816 Spondylosis without myelopathy or radiculopathy, lumbar region: Secondary | ICD-10-CM | POA: Diagnosis not present

## 2019-09-17 DIAGNOSIS — E119 Type 2 diabetes mellitus without complications: Secondary | ICD-10-CM | POA: Diagnosis not present

## 2019-09-17 DIAGNOSIS — Z171 Estrogen receptor negative status [ER-]: Secondary | ICD-10-CM | POA: Insufficient documentation

## 2019-09-17 DIAGNOSIS — C50411 Malignant neoplasm of upper-outer quadrant of right female breast: Secondary | ICD-10-CM | POA: Diagnosis not present

## 2019-09-17 DIAGNOSIS — Z1379 Encounter for other screening for genetic and chromosomal anomalies: Secondary | ICD-10-CM | POA: Insufficient documentation

## 2019-09-17 DIAGNOSIS — Z95818 Presence of other cardiac implants and grafts: Secondary | ICD-10-CM | POA: Diagnosis not present

## 2019-09-17 DIAGNOSIS — G4733 Obstructive sleep apnea (adult) (pediatric): Secondary | ICD-10-CM | POA: Diagnosis not present

## 2019-09-17 DIAGNOSIS — G894 Chronic pain syndrome: Secondary | ICD-10-CM | POA: Diagnosis not present

## 2019-09-17 DIAGNOSIS — S9422XS Injury of deep peroneal nerve at ankle and foot level, left leg, sequela: Secondary | ICD-10-CM | POA: Diagnosis not present

## 2019-09-17 DIAGNOSIS — Z79891 Long term (current) use of opiate analgesic: Secondary | ICD-10-CM | POA: Diagnosis not present

## 2019-09-17 DIAGNOSIS — K219 Gastro-esophageal reflux disease without esophagitis: Secondary | ICD-10-CM | POA: Diagnosis not present

## 2019-09-17 HISTORY — DX: Malignant neoplasm of unspecified site of unspecified female breast: C50.919

## 2019-09-17 LAB — HEMOGLOBIN A1C
Hgb A1c MFr Bld: 6.8 % — ABNORMAL HIGH (ref 4.8–5.6)
Mean Plasma Glucose: 148.46 mg/dL

## 2019-09-17 LAB — GLUCOSE, CAPILLARY: Glucose-Capillary: 120 mg/dL — ABNORMAL HIGH (ref 70–99)

## 2019-09-17 NOTE — Telephone Encounter (Signed)
Left message for a return phone call to follow up from [facility]. °

## 2019-09-17 NOTE — Telephone Encounter (Signed)
Revealed negative genetic testing.  Discussed that we do not know why she has breast cancer or why there is cancer in the family. It could be due to a different gene that we are not testing, or maybe our current technology may not be able to pick something up.  It will be important for her to keep in contact with genetics to keep up with whether additional testing may be needed.   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  This will not change medical management.

## 2019-09-17 NOTE — Progress Notes (Signed)
HPI:  Ms. Knodel was previously seen in the [facility] due to a personal and family history of cancer and concerns regarding a hereditary predisposition to cancer. Please refer to our prior cancer genetics clinic note for more information regarding our discussion, assessment and recommendations, at the time. Ms. Blunck recent genetic test results were disclosed to her, as were recommendations warranted by these results. These results and recommendations are discussed in more detail below.  CANCER HISTORY:  Oncology History  Malignant neoplasm of upper-outer quadrant of right breast in female, estrogen receptor negative ([place])  09/07/2019 Initial Diagnosis   Routine screening mammogram detected a 2.1cm mass in the left breast and no left axillary adenopathy. 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   09/22/2019 -  Chemotherapy   The patient had palonosetron (ALOXI) injection 0.25 mg, 0.25 mg, Intravenous,  Once, 0 of 6 cycles pegfilgrastim-jmdb (FULPHILA) injection 6 mg, 6 mg, Subcutaneous,  Once, 0 of 6 cycles cyclophosphamide (CYTOXAN) 1,100 mg in sodium chloride 0.9 % 250 mL chemo infusion, 500 mg/m2 = 1,100 mg (83.3 % of original dose 600 mg/m2), Intravenous,  Once, 0 of 6 cycles Dose modification: 500 mg/m2 (original dose 600 mg/m2, Cycle 1, Reason: Provider Judgment) DOCEtaxel (TAXOTERE) 140 mg in sodium chloride 0.9 % 250 mL chemo infusion, 65 mg/m2 = 140 mg (86.7 % of original dose 75 mg/m2), Intravenous,  Once, 0 of 6 cycles Dose modification: 65 mg/m2 (original dose 75 mg/m2, Cycle 1, Reason: Provider Judgment) fosaprepitant (EMEND) 150 mg in sodium chloride 0.9 % 145 mL IVPB, , Intravenous,  Once, 0 of 6 cycles  for chemotherapy treatment.      FAMILY HISTORY:  We obtained a detailed, 4-generation family history.  Significant diagnoses are listed below: Family History  Problem Relation Age of Onset   Malignant  hyperthermia Father    Hypertension Father    Heart disease Father    Diabetes Father    Cancer Father        skin   Hypertension Mother    Heart disease Mother    Multiple myeloma Mother    Cancer Sister        CERVICAL   Hypertension Sister    Cancer Brother 35       MELANOMA   Heart disease Maternal Grandmother    Other Maternal Grandmother 32       complications of childbirth   Heart disease Maternal Grandfather    Cancer Paternal Grandmother        ?    Heart disease Paternal Grandmother    Heart disease Paternal Grandfather    Cancer Brother        LUNG   Diabetes Sister    Hypertension Sister    Heart disease Sister    Cancer Sister    Cancer Brother    Pancreatic cancer Niece 48   Cancer Nephew 6       unknown- currently in the [employer]   Anesthesia problems Neg Hx    Hypotension Neg Hx    Pseudochol deficiency Neg Hx    Colon cancer Neg Hx    Esophageal cancer Neg Hx    Rectal cancer Neg Hx    Stomach cancer Neg Hx    Breast cancer Neg Hx     The patient has three sons who are cancer free.  She has three brothers and four sisters.  One brother died at 38 from [place], one  brother died at 50 from lung cancer.  A sister had cervical cancer at 27, and another sister has a daughter with pancreatic cancer at age 31.  A third sister has a son who has an unknown cancer at age 60.  The patient's parents are deceased.  The patient's mother had multiple myeloma and died at 45.  She had two brothers who are deceased from non cancer related issues.  The maternal grandparents are deceased.  One from stroke and the other from childbirth.  The patient's father died of heart problems.  He had a paternal half sister who is deceased.  The paternal grandparents are deceased from non cancer related issues.  Ms. Osier is unaware of previous family history of genetic testing for hereditary cancer risks. Patient's maternal ancestors are of  [place] descent, and paternal ancestors are of Caucasian descent. There is no reported Ashkenazi Jewish ancestry. There is no known consanguinity.     GENETIC TEST RESULTS: Genetic testing reported out on September 15, 2019 through the Multi-cancer panel found no pathogenic mutations. The Multi-Gene Panel offered by [facility] includes sequencing and/or deletion duplication testing of the following 85 genes: AIP, ALK, APC, ATM, AXIN2,BAP1,  BARD1, BLM, BMPR1A, BRCA1, BRCA2, BRIP1, CASR, CDC73, CDH1, CDK4, CDKN1B, CDKN1C, CDKN2A (p14ARF), CDKN2A (p16INK4a), CEBPA, CHEK2, CTNNA1, DICER1, DIS3L2, EGFR (c.2369C>T, p.Thr790Met variant only), EPCAM (Deletion/duplication testing only), FH, FLCN, GATA2, GPC3, GREM1 (Promoter region deletion/duplication testing only), HOXB13 (c.251G>A, p.Gly84Glu), HRAS, KIT, MAX, MEN1, MET, MITF (c.952G>A, p.Glu318Lys variant only), MLH1, MSH2, MSH3, MSH6, MUTYH, NBN, NF1, NF2, NTHL1, PALB2, PDGFRA, PHOX2B, PMS2, POLD1, POLE, POT1, PRKAR1A, PTCH1, PTEN, RAD50, RAD51C, RAD51D, RB1, RECQL4, RET, RNF43, RUNX1, SDHAF2, SDHA (sequence changes only), SDHB, SDHC, SDHD, SMAD4, SMARCA4, SMARCB1, SMARCE1, STK11, SUFU, TERC, TERT, TMEM127, TP53, TSC1, TSC2, VHL, WRN and WT1.  The test report has been scanned into EPIC and is located under the Molecular Pathology section of the Results Review tab.  A portion of the result report is included below for reference.     We discussed with [name] that because current genetic testing is not perfect, it is possible there may be a gene mutation in one of these genes that current testing cannot detect, but that chance is small.  We also discussed, that there could be another gene that has not yet been discovered, or that we have not yet tested, that is responsible for the cancer diagnoses in the family. It is also possible there is a hereditary cause for the cancer in the family that [name] did not inherit and therefore was not  identified in her testing.  Therefore, it is important to remain in touch with cancer genetics in the future so that we can continue to offer [name] the most up to date genetic testing.   Genetic testing did identify a variant of uncertain significance (VUS) was identified in the AXIN2 gene called c.2141+4A>G.  At this time, it is unknown if this variant is associated with increased cancer risk or if this is a normal finding, but most variants such as this get reclassified to being inconsequential. It should not be used to make medical management decisions. With time, we suspect the lab will determine the significance of this variant, if any. If we do learn more about it, we will try to contact [name] to discuss it further. However, it is important to stay in touch with [place] periodically and keep the address and phone number up to date.  ADDITIONAL  GENETIC TESTING: We discussed with [name] that her genetic testing was fairly extensive.  If there are genes identified to increase cancer risk that can be analyzed in the future, we would be happy to discuss and coordinate this testing at that time.    CANCER SCREENING RECOMMENDATIONS: Ms. Foisy test result is considered negative (normal).  This means that we have not identified a hereditary cause for her personal and family history of cancer at this time. Most cancers happen by chance and this negative test suggests that her cancer may fall into this category.    While reassuring, this does not definitively rule out a hereditary predisposition to cancer. It is still possible that there could be genetic mutations that are undetectable by current technology. There could be genetic mutations in genes that have not been tested or identified to increase cancer risk.  Therefore, it is recommended she continue to follow the cancer management and screening guidelines provided by her oncology and primary healthcare provider.   An  individual's cancer risk and medical management are not determined by genetic test results alone. Overall cancer risk assessment incorporates additional factors, including personal medical history, family history, and any available genetic information that may result in a personalized plan for cancer prevention and surveillance  RECOMMENDATIONS FOR FAMILY MEMBERS:  Individuals in this family might be at some increased risk of developing cancer, over the general population risk, simply due to the family history of cancer.  We recommended women in this family have a yearly mammogram beginning at age 68, or 17 years younger than the earliest onset of cancer, an annual clinical breast exam, and perform monthly breast self-exams. Women in this family should also have a gynecological exam as recommended by their primary provider. All family members should have a colonoscopy by age 96.  FOLLOW-UP: Lastly, we discussed with [name] that cancer genetics is a rapidly advancing field and it is possible that new genetic tests will be appropriate for her and/or her family members in the future. We encouraged her to remain in contact with cancer genetics on an annual basis so we can update her personal and family histories and let her know of advances in cancer genetics that may benefit this family.   Our contact number was provided. Ms. Souffrant questions were answered to her satisfaction, and she knows she is welcome to call us at anytime with additional questions or concerns.   Roma Kayser, [place], [facility] Licensed, Certified Genetic Counselor [email or web address]

## 2019-09-17 NOTE — Progress Notes (Addendum)
PCP - Colin Benton Cardiologist - Dr. Einar Gip Pt asked if she received cardiac clearance from Dr. Einar Gip.  Pt stated that Dr. Cristal Generous office was going to work on getting clearance. Pt also stated that she has not seen Dr. Einar Gip since last year, as it was not time for a follow up appointment. Pt stated she would contact Dr. Irven Shelling office, as well, for cardiac clearance.  Jeneen Rinks notified and is aware.  Loop Recorder - Dr. Norville Haggard [facility])  Chest x-ray - n/a EKG - 09-23-19 ECHO - 01-27-2017 Cardiac Cath - date unknown  SA - yes, wears CPAP  DM - Type 2, pt does not check sugars at home   Aspirin Instructions: follow surgeon's instructions on when to stop ASA 81mg .  Pt instructed to call Dr. Cristal Generous office for further instructions on when to stop ASA.  Pt stated understanding.  ERAS Protcol - yes, G2 given   COVID TEST- Saturday, 09-19-19   Anesthesia review: yes, heart history, loop recorder  Patient denies shortness of breath, fever, cough and chest pain at PAT appointment Pt has SOB, & chronic cough, not new symptoms   All instructions explained to the patient, with a verbal understanding of the material. Patient agrees to go over the instructions while at home for a better understanding. Patient also instructed to self quarantine after being tested for COVID-19. The opportunity to ask questions was provided.

## 2019-09-17 NOTE — Anesthesia Preprocedure Evaluation (Addendum)
Anesthesia Evaluation  Patient identified by MRN, date of birth, ID band Patient awake    Reviewed: Allergy & Precautions, NPO status , Patient's Chart, lab work & pertinent test results  History of Anesthesia Complications (+) PONV and DIFFICULT AIRWAY  Airway Mallampati: II  TM Distance: >3 FB     Dental   Pulmonary pneumonia,    breath sounds clear to auscultation       Cardiovascular hypertension, + angina + CAD, + Past MI and +CHF  + dysrhythmias + Valvular Problems/Murmurs  Rhythm:Regular Rate:Normal     Neuro/Psych    GI/Hepatic hiatal hernia, PUD, GERD  ,  Endo/Other  diabetes  Renal/GU      Musculoskeletal   Abdominal   Peds  Hematology  (+) anemia ,   Anesthesia Other Findings   Reproductive/Obstetrics                            Anesthesia Physical Anesthesia Plan  ASA: III  Anesthesia Plan: General   Post-op Pain Management:    Induction: Intravenous  PONV Risk Score and Plan: 4 or greater and Ondansetron, Dexamethasone and Midazolam  Airway Management Planned: LMA  Additional Equipment:   Intra-op Plan:   Post-operative Plan: Extubation in OR  Informed Consent: I have reviewed the patients History and Physical, chart, labs and discussed the procedure including the risks, benefits and alternatives for the proposed anesthesia with the patient or authorized representative who has indicated his/her understanding and acceptance.     Dental advisory given  Plan Discussed with: CRNA and Anesthesiologist  Anesthesia Plan Comments: (Previously followed with EP cardiology at [facility] for hx of palpitations and pre syncope. She had a loop recorder placed in 2015. Per last OV note 04/24/18 "Extensive review of her records indicate that she has not had any episodes of atrial fibrillation or bradycardia episodes. Numerous episodes which the device have called pauses or atrial  fibrillation have actually been under sensing or noise." Her device was noted to be at RRT as of 03/25/18 and decision was made not to implant new device as no significant events had been captured during the life of current device.   She also follows with Dr. Einar Gip for general cardiology. Per last OV note 01/30/18 " he is here on a 56-monthoffice visit and follow-up, except for sharp chest pain when she cannot take a deep breath that occurs at night occasionally she has remained stable and has not had any clinical evidence of heart failure.  Echocardiogram revealing right ventricular dilation secondary to severe restrictive lung disease without evidence of pulmonary hypertension and no clinical evidence of heart failure and no change from 6 months ago.  Hence at this point I will make her visits PRN with me.  Blood pressures well controlled.  She is on statins and diabetes is being managed by her PCP which patient states is also well controlled.  She is on appropriate medical therapy."  Follows with Dr. 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 Spirometry 07/2018 FVC 1.64 (46%), FEV1 1.34 (49%), ratio 82 (104%) showed restrictive but not obstructive pattern. Per last OV note 07/16/19 she had excellent compliance with CPAP.  She is maintained on symbicort and singulair for asthma with PRN albuterol.   Documented history of difficult intubation in pt chart since at least 2012. I spoke with patient and she denies this, says she has previously had general endotracheal intubation without issue - reports  procedure was 20+ years ago so we do not have records.  Reviewed anesthesia records available in Epic as well as requested records from arthroscopic knee surgery performed at [facility] in 2003, all procedures were performed under MAC, spinal, or GA with LMA, no complications reported. She does have a history of TMJ and reports she has had one dislocation many years ago that required reduction in the ED. She  also has chronic uncontrolled GERD, reports this continues to be moderate to severe.  TTE 01/07/17: Conclusion:  1.  Left ventricle cavity is normal in size.  Mild concentric hypertrophy of left ventricle.  Normal global wall motion.  Normal diastolic filling pattern, normal LAP.  Calculated EF 58%. 2.  Left atrial cavity is moderate to severely dilated.  LA measures 4.7 cm. 3.  Right atrial cavity is moderately to severely dilated. 4.  Right ventricle cavity is moderately dilated.  Normal right ventricular function. 5.  Trace tricuspid regurgitation.  No evidence of pulmonary hypertension.  PA pressure may be inaccurate due to minimal TR jet. )   Anesthesia Quick Evaluation

## 2019-09-19 ENCOUNTER — Other Ambulatory Visit (HOSPITAL_COMMUNITY)
Admission: RE | Admit: 2019-09-19 | Discharge: 2019-09-19 | Disposition: A | Discharge status: Home / Self Care | Payer: Medicare Other | Source: Ambulatory Visit | Attending: General Surgery | Admitting: General Surgery

## 2019-09-19 DIAGNOSIS — Z01812 Encounter for preprocedural laboratory examination: Secondary | ICD-10-CM | POA: Diagnosis not present

## 2019-09-19 DIAGNOSIS — Z20828 Contact with and (suspected) exposure to other viral communicable diseases: Secondary | ICD-10-CM | POA: Insufficient documentation

## 2019-09-20 LAB — NOVEL CORONAVIRUS, NAA (HOSP ORDER, SEND-OUT TO REF LAB; TAT 18-24 HRS): SARS-CoV-2, NAA: NOT DETECTED

## 2019-09-21 ENCOUNTER — Ambulatory Visit: Payer: Medicare Other | Admitting: Podiatry

## 2019-09-22 NOTE — Progress Notes (Signed)
Anesthesia Chart Review: Previously followed with EP cardiology at [facility] for hx of palpitations and pre syncope. She had a loop recorder placed in 2015. Per last OV note 04/24/18 "Extensive review of her records indicate that she has not had any episodes of atrial fibrillation or bradycardia episodes. Numerous episodes which the device have called pauses or atrial fibrillation have actually been under sensing or noise." Her device was noted to be at RRT as of 03/25/18 and decision was made not to implant new device as no significant events had been captured during the life of current device.   She also follows with Dr. Einar Gip for general cardiology. Per last OV note 01/30/18 " he is here on a 46-monthoffice visit and follow-up, except for sharp chest pain when she cannot take a deep breath that occurs at night occasionally she has remained stable and has not had any clinical evidence of heart failure.  Echocardiogram revealing right ventricular dilation secondary to severe restrictive lung disease without evidence of pulmonary hypertension and no clinical evidence of heart failure and no change from 6 months ago.  Hence at this point I will make her visits PRN with me.  Blood pressures well controlled.  She is on statins and diabetes is being managed by her PCP which patient states is also well controlled.  She is on appropriate medical therapy."  Follows with Dr. 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 Spirometry 07/2018 FVC 1.64 (46%), FEV1 1.34 (49%), ratio 82 (104%) showed restrictive but not obstructive pattern. Per last OV note 07/16/19 she had excellent compliance with CPAP.  She is maintained on symbicort and singulair for asthma with PRN albuterol.   Documented history of difficult intubation in pt chart since at least 2012. I spoke with patient and she denies this, says she has previously had general endotracheal intubation without issue - reports procedure was 20+ years ago so we do not  have records.  Reviewed anesthesia records available in Epic as well as [facility] all procedures were performed under MAC, spinal, or GA with LMA, no complications reported. She does have a history of TMJ and reports she has had one dislocation many years ago that required reduction in the ED. She also has chronic uncontrolled GERD, reports this continues to be moderate to severe.  TTE 01/07/17: Conclusion:  1.  Left ventricle cavity is normal in size.  Mild concentric hypertrophy of left ventricle.  Normal global wall motion.  Normal diastolic filling pattern, normal LAP.  Calculated EF 58%. 2.  Left atrial cavity is moderate to severely dilated.  LA measures 4.7 cm. 3.  Right atrial cavity is moderately to severely dilated. 4.  Right ventricle cavity is moderately dilated.  Normal right ventricular function. 5.  Trace tricuspid regurgitation.  No evidence of pulmonary hypertension.  PA pressure may be inaccurate due to minimal TR jet.    JWynonia MustyMMont[facility]/Anesthesiology Phone ([phone]/20/2020 9:54 AM

## 2019-09-23 ENCOUNTER — Ambulatory Visit (HOSPITAL_COMMUNITY)
Admission: RE | Admit: 2019-09-23 | Discharge: 2019-09-23 | Disposition: A | Discharge status: Home / Self Care | Payer: Medicare Other | Attending: General Surgery | Admitting: General Surgery

## 2019-09-23 ENCOUNTER — Ambulatory Visit (HOSPITAL_COMMUNITY): Payer: Medicare Other

## 2019-09-23 ENCOUNTER — Ambulatory Visit (HOSPITAL_COMMUNITY): Payer: Medicare Other | Admitting: Physician Assistant

## 2019-09-23 ENCOUNTER — Other Ambulatory Visit: Payer: Self-pay

## 2019-09-23 ENCOUNTER — Encounter (HOSPITAL_COMMUNITY): Payer: Self-pay

## 2019-09-23 ENCOUNTER — Ambulatory Visit (HOSPITAL_COMMUNITY): Payer: Medicare Other | Admitting: Certified Registered Nurse Anesthetist

## 2019-09-23 ENCOUNTER — Encounter (HOSPITAL_COMMUNITY)
Admission: RE | Disposition: A | Discharge status: Home / Self Care | Payer: Self-pay | Source: Home / Self Care | Attending: General Surgery

## 2019-09-23 DIAGNOSIS — G4733 Obstructive sleep apnea (adult) (pediatric): Secondary | ICD-10-CM | POA: Diagnosis not present

## 2019-09-23 DIAGNOSIS — Z452 Encounter for adjustment and management of vascular access device: Secondary | ICD-10-CM | POA: Diagnosis not present

## 2019-09-23 DIAGNOSIS — Z9981 Dependence on supplemental oxygen: Secondary | ICD-10-CM | POA: Diagnosis not present

## 2019-09-23 DIAGNOSIS — Z95828 Presence of other vascular implants and grafts: Secondary | ICD-10-CM

## 2019-09-23 DIAGNOSIS — J45909 Unspecified asthma, uncomplicated: Secondary | ICD-10-CM | POA: Diagnosis not present

## 2019-09-23 DIAGNOSIS — G4730 Sleep apnea, unspecified: Secondary | ICD-10-CM | POA: Diagnosis not present

## 2019-09-23 DIAGNOSIS — I110 Hypertensive heart disease with heart failure: Secondary | ICD-10-CM | POA: Insufficient documentation

## 2019-09-23 DIAGNOSIS — J9811 Atelectasis: Secondary | ICD-10-CM | POA: Diagnosis not present

## 2019-09-23 DIAGNOSIS — Z419 Encounter for procedure for purposes other than remedying health state, unspecified: Secondary | ICD-10-CM

## 2019-09-23 DIAGNOSIS — I252 Old myocardial infarction: Secondary | ICD-10-CM | POA: Diagnosis not present

## 2019-09-23 DIAGNOSIS — J189 Pneumonia, unspecified organism: Secondary | ICD-10-CM | POA: Diagnosis not present

## 2019-09-23 DIAGNOSIS — J4540 Moderate persistent asthma, uncomplicated: Secondary | ICD-10-CM | POA: Diagnosis not present

## 2019-09-23 DIAGNOSIS — C50912 Malignant neoplasm of unspecified site of left female breast: Secondary | ICD-10-CM | POA: Insufficient documentation

## 2019-09-23 DIAGNOSIS — E119 Type 2 diabetes mellitus without complications: Secondary | ICD-10-CM | POA: Insufficient documentation

## 2019-09-23 DIAGNOSIS — R0902 Hypoxemia: Secondary | ICD-10-CM | POA: Diagnosis not present

## 2019-09-23 DIAGNOSIS — I2510 Atherosclerotic heart disease of native coronary artery without angina pectoris: Secondary | ICD-10-CM | POA: Diagnosis not present

## 2019-09-23 DIAGNOSIS — I509 Heart failure, unspecified: Secondary | ICD-10-CM | POA: Insufficient documentation

## 2019-09-23 DIAGNOSIS — C50412 Malignant neoplasm of upper-outer quadrant of left female breast: Secondary | ICD-10-CM | POA: Diagnosis not present

## 2019-09-23 DIAGNOSIS — Z171 Estrogen receptor negative status [ER-]: Secondary | ICD-10-CM | POA: Diagnosis not present

## 2019-09-23 DIAGNOSIS — C50411 Malignant neoplasm of upper-outer quadrant of right female breast: Secondary | ICD-10-CM | POA: Diagnosis not present

## 2019-09-23 HISTORY — PX: PORTACATH PLACEMENT: SHX2246

## 2019-09-23 LAB — GLUCOSE, CAPILLARY
Glucose-Capillary: 117 mg/dL — ABNORMAL HIGH (ref 70–99)
Glucose-Capillary: 125 mg/dL — ABNORMAL HIGH (ref 70–99)

## 2019-09-23 SURGERY — INSERTION, TUNNELED CENTRAL VENOUS DEVICE, WITH PORT
Anesthesia: General | Site: Chest

## 2019-09-23 MED ORDER — HEPARIN SOD (PORK) LOCK FLUSH 100 UNIT/ML IV SOLN
INTRAVENOUS | Status: AC
  Filled 2019-09-23: qty 5

## 2019-09-23 MED ORDER — HYDROCODONE-ACETAMINOPHEN 10-325 MG PO TABS
ORAL_TABLET | ORAL | Status: AC
  Filled 2019-09-23: qty 1

## 2019-09-23 MED ORDER — CHLORHEXIDINE GLUCONATE CLOTH 2 % EX PADS: 6.0000 | MEDICATED_PAD | Freq: Once | CUTANEOUS | Status: DC

## 2019-09-23 MED ORDER — 0.9 % SODIUM CHLORIDE (POUR BTL) OPTIME
TOPICAL | Status: DC | PRN
  Administered 2019-09-23: 13:00:00 1000 mL

## 2019-09-23 MED ORDER — MIDAZOLAM HCL 5 MG/5ML IJ SOLN
INTRAMUSCULAR | Status: DC | PRN
  Administered 2019-09-23: 2 mg via INTRAVENOUS

## 2019-09-23 MED ORDER — METOPROLOL TARTRATE 5 MG/5ML IV SOLN
INTRAVENOUS | Status: AC
  Filled 2019-09-23: qty 5

## 2019-09-23 MED ORDER — EPHEDRINE SULFATE-NACL 50-0.9 MG/10ML-% IV SOSY
PREFILLED_SYRINGE | INTRAVENOUS | Status: DC | PRN
  Administered 2019-09-23: 10 mg via INTRAVENOUS

## 2019-09-23 MED ORDER — HYDROCODONE-ACETAMINOPHEN 10-325 MG PO TABS
1.0000 | ORAL_TABLET | Freq: Once | ORAL | Status: AC
  Administered 2019-09-23: 15:00:00 1 via ORAL

## 2019-09-23 MED ORDER — SODIUM CHLORIDE 0.9 % IV SOLN
INTRAVENOUS | Status: AC
  Filled 2019-09-23: qty 1.2

## 2019-09-23 MED ORDER — GABAPENTIN 100 MG PO CAPS
ORAL_CAPSULE | ORAL | Status: AC
  Administered 2019-09-23: 11:00:00 100 mg via ORAL
  Filled 2019-09-23: qty 1

## 2019-09-23 MED ORDER — SODIUM CHLORIDE 0.9 % IV SOLN: 250.0000 mL | INTRAVENOUS | Status: DC | PRN

## 2019-09-23 MED ORDER — SODIUM CHLORIDE 0.9 % IV SOLN
INTRAVENOUS | Status: DC | PRN
  Administered 2019-09-23: 13:00:00

## 2019-09-23 MED ORDER — HEPARIN SOD (PORK) LOCK FLUSH 100 UNIT/ML IV SOLN
INTRAVENOUS | Status: DC | PRN
  Administered 2019-09-23: 500 [IU] via INTRAVENOUS

## 2019-09-23 MED ORDER — DEXAMETHASONE SODIUM PHOSPHATE 10 MG/ML IJ SOLN
INTRAMUSCULAR | Status: DC | PRN
  Administered 2019-09-23: 5 mg via INTRAVENOUS

## 2019-09-23 MED ORDER — GLYCOPYRROLATE PF 0.2 MG/ML IJ SOSY
PREFILLED_SYRINGE | INTRAMUSCULAR | Status: DC | PRN
  Administered 2019-09-23: .1 mg via INTRAVENOUS
  Administered 2019-09-23: .2 mg via INTRAVENOUS

## 2019-09-23 MED ORDER — CIPROFLOXACIN IN D5W 400 MG/200ML IV SOLN
400.0000 mg | INTRAVENOUS | Status: AC
  Administered 2019-09-23: 13:00:00 400 mg via INTRAVENOUS

## 2019-09-23 MED ORDER — SODIUM CHLORIDE 0.9% FLUSH: 3.0000 mL | INTRAVENOUS | Status: DC | PRN

## 2019-09-23 MED ORDER — HYDROCODONE-ACETAMINOPHEN 10-325 MG PO TABS: 1.0000 | ORAL_TABLET | Freq: Four times a day (QID) | ORAL | 0 refills | Status: DC | PRN

## 2019-09-23 MED ORDER — BUPIVACAINE HCL (PF) 0.25 % IJ SOLN
INTRAMUSCULAR | Status: AC
  Filled 2019-09-23: qty 30

## 2019-09-23 MED ORDER — LIDOCAINE 2% (20 MG/ML) 5 ML SYRINGE
INTRAMUSCULAR | Status: DC | PRN
  Administered 2019-09-23: 100 mg via INTRAVENOUS

## 2019-09-23 MED ORDER — FENTANYL CITRATE (PF) 250 MCG/5ML IJ SOLN
INTRAMUSCULAR | Status: AC
  Filled 2019-09-23: qty 5

## 2019-09-23 MED ORDER — BUPIVACAINE HCL 0.25 % IJ SOLN
INTRAMUSCULAR | Status: DC | PRN
  Administered 2019-09-23: 6 mL

## 2019-09-23 MED ORDER — SODIUM CHLORIDE 0.9 % IV SOLN: INTRAVENOUS | Status: DC

## 2019-09-23 MED ORDER — PROPOFOL 10 MG/ML IV BOLUS
INTRAVENOUS | Status: AC
  Filled 2019-09-23: qty 20

## 2019-09-23 MED ORDER — ACETAMINOPHEN 325 MG PO TABS: 650.0000 mg | ORAL_TABLET | ORAL | Status: DC | PRN

## 2019-09-23 MED ORDER — SODIUM CHLORIDE 0.9% FLUSH: 3.0000 mL | Freq: Two times a day (BID) | INTRAVENOUS | Status: DC

## 2019-09-23 MED ORDER — GABAPENTIN 100 MG PO CAPS
100.0000 mg | ORAL_CAPSULE | ORAL | Status: AC
  Administered 2019-09-23: 11:00:00 100 mg via ORAL

## 2019-09-23 MED ORDER — MORPHINE SULFATE (PF) 2 MG/ML IV SOLN: 1.0000 mg | INTRAVENOUS | Status: DC | PRN

## 2019-09-23 MED ORDER — SODIUM CHLORIDE 0.9 % IV SOLN
INTRAVENOUS | Status: DC | PRN
  Administered 2019-09-23: 13:00:00 75 ug/min via INTRAVENOUS

## 2019-09-23 MED ORDER — LACTATED RINGERS IV SOLN
INTRAVENOUS | Status: DC
  Administered 2019-09-23: 11:00:00 via INTRAVENOUS

## 2019-09-23 MED ORDER — ACETAMINOPHEN 650 MG RE SUPP: 650.0000 mg | RECTAL | Status: DC | PRN

## 2019-09-23 MED ORDER — MIDAZOLAM HCL 2 MG/2ML IJ SOLN
INTRAMUSCULAR | Status: AC
  Filled 2019-09-23: qty 2

## 2019-09-23 MED ORDER — PROPOFOL 10 MG/ML IV BOLUS
INTRAVENOUS | Status: DC | PRN
  Administered 2019-09-23: 120 mg via INTRAVENOUS

## 2019-09-23 MED ORDER — CIPROFLOXACIN IN D5W 400 MG/200ML IV SOLN
INTRAVENOUS | Status: AC
  Filled 2019-09-23: qty 200

## 2019-09-23 MED ORDER — ONDANSETRON HCL 4 MG/2ML IJ SOLN
INTRAMUSCULAR | Status: DC | PRN
  Administered 2019-09-23: 4 mg via INTRAVENOUS

## 2019-09-23 MED ORDER — ENSURE PRE-SURGERY PO LIQD: 296.0000 mL | Freq: Once | ORAL | Status: DC

## 2019-09-23 SURGICAL SUPPLY — 51 items
ADH SKN CLS APL DERMABOND .7 (GAUZE/BANDAGES/DRESSINGS) ×1
BAG DECANTER FOR FLEXI CONT (MISCELLANEOUS) ×3 IMPLANT
CHLORAPREP W/TINT 10.5 ML (MISCELLANEOUS) ×3 IMPLANT
COVER PROBE W GEL 5X96 (DRAPES) ×2 IMPLANT
COVER SURGICAL LIGHT HANDLE (MISCELLANEOUS) ×3 IMPLANT
COVER TRANSDUCER ULTRASND GEL (DRAPE) ×3 IMPLANT
COVER WAND RF STERILE (DRAPES) ×3 IMPLANT
DECANTER SPIKE VIAL GLASS SM (MISCELLANEOUS) ×3 IMPLANT
DERMABOND ADVANCED (GAUZE/BANDAGES/DRESSINGS) ×2
DERMABOND ADVANCED .7 DNX12 (GAUZE/BANDAGES/DRESSINGS) ×1 IMPLANT
DRAPE C-ARM 42X72 X-RAY (DRAPES) ×3 IMPLANT
DRAPE CHEST BREAST 15X10 FENES (DRAPES) ×3 IMPLANT
DRSG TEGADERM 4X4.75 (GAUZE/BANDAGES/DRESSINGS) ×6 IMPLANT
ELECT CAUTERY BLADE 6.4 (BLADE) ×3 IMPLANT
ELECT REM PT RETURN 9FT ADLT (ELECTROSURGICAL) ×3
ELECTRODE REM PT RTRN 9FT ADLT (ELECTROSURGICAL) ×1 IMPLANT
GAUZE 4X4 16PLY RFD (DISPOSABLE) ×3 IMPLANT
GAUZE SPONGE 4X4 12PLY STRL (GAUZE/BANDAGES/DRESSINGS) ×2 IMPLANT
GEL ULTRASOUND 20GR AQUASONIC (MISCELLANEOUS) ×3 IMPLANT
GLOVE BIO SURGEON STRL SZ7 (GLOVE) ×5 IMPLANT
GLOVE BIO SURGEON STRL SZ8 (GLOVE) ×2 IMPLANT
GLOVE BIOGEL PI IND STRL 6.5 (GLOVE) IMPLANT
GLOVE BIOGEL PI IND STRL 7.5 (GLOVE) ×1 IMPLANT
GLOVE BIOGEL PI IND STRL 8 (GLOVE) IMPLANT
GLOVE BIOGEL PI INDICATOR 6.5 (GLOVE) ×2
GLOVE BIOGEL PI INDICATOR 7.5 (GLOVE) ×2
GLOVE BIOGEL PI INDICATOR 8 (GLOVE) ×2
GOWN STRL REUS W/ TWL LRG LVL3 (GOWN DISPOSABLE) ×2 IMPLANT
GOWN STRL REUS W/TWL 2XL LVL3 (GOWN DISPOSABLE) ×2 IMPLANT
GOWN STRL REUS W/TWL LRG LVL3 (GOWN DISPOSABLE) ×6
INTRODUCER COOK 11FR (CATHETERS) IMPLANT
KIT BASIN OR (CUSTOM PROCEDURE TRAY) ×3 IMPLANT
KIT PORT POWER 8FR ISP CVUE (Port) ×3 IMPLANT
KIT TURNOVER KIT B (KITS) ×3 IMPLANT
NS IRRIG 1000ML POUR BTL (IV SOLUTION) ×3 IMPLANT
PAD ARMBOARD 7.5X6 YLW CONV (MISCELLANEOUS) ×6 IMPLANT
PENCIL BUTTON HOLSTER BLD 10FT (ELECTRODE) ×3 IMPLANT
POSITIONER HEAD DONUT 9IN (MISCELLANEOUS) ×3 IMPLANT
SET INTRODUCER 12FR PACEMAKER (INTRODUCER) IMPLANT
SET SHEATH INTRODUCER 10FR (MISCELLANEOUS) IMPLANT
SHEATH COOK PEEL AWAY SET 9F (SHEATH) IMPLANT
SUT MNCRL AB 4-0 PS2 18 (SUTURE) ×3 IMPLANT
SUT PROLENE 2 0 SH DA (SUTURE) ×3 IMPLANT
SUT SILK 2 0 (SUTURE)
SUT SILK 2-0 18XBRD TIE 12 (SUTURE) IMPLANT
SUT VIC AB 3-0 SH 27 (SUTURE) ×3
SUT VIC AB 3-0 SH 27XBRD (SUTURE) ×1 IMPLANT
SYR 5ML LUER SLIP (SYRINGE) ×3 IMPLANT
TOWEL GREEN STERILE (TOWEL DISPOSABLE) ×3 IMPLANT
TOWEL GREEN STERILE FF (TOWEL DISPOSABLE) ×3 IMPLANT
TRAY LAPAROSCOPIC MC (CUSTOM PROCEDURE TRAY) ×3 IMPLANT

## 2019-09-23 NOTE — H&P (Signed)
63 yof referred by Dr Joneen Caraway for new left breast cancer. she has no prior history. no real fh. she had a screening mm. she has b density breasts. she had a left breast mass/distortion noted at 1 oclock. on [place] this is 2.1x1.1x1 cm in size. axillary [place] was negative. 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 she has a loop recorder present.   Past Surgical History Tawni Pummel, RN; 09/09/2019 7:23 AM) Breast Biopsy  Right. Colon Polyp Removal - Colonoscopy   Diagnostic Studies History Tawni Pummel, RN; 09/09/2019 7:23 AM) Colonoscopy  1-5 years ago Mammogram  within last year  Medication History Tawni Pummel, RN; 09/09/2019 7:23 AM) Medications Reconciled  Social History Tawni Pummel, RN; 09/09/2019 7:23 AM) Alcohol use  Occasional alcohol use. No caffeine use  No drug use  Tobacco use  Never smoker.  Family History Tawni Pummel, RN; 09/09/2019 7:23 AM) Alcohol Abuse  Brother. Anesthetic complications  Sister. Arthritis  Father. Bleeding disorder  Family Members In General. Cervical Cancer  Sister. Colon Cancer  Family Members In General. Diabetes Mellitus  Father. Hypertension  Father. Kidney Disease  Family Members In General. Malignant Neoplasm Of Pancreas  Family Members In General. Migraine Headache  Family Members In General, Sister.  Pregnancy / Birth History Tawni Pummel, RN; 09/09/2019 7:23 AM) Age at menarche  73 years. Age of menopause  82-60 Gravida  7 Irregular periods  Maternal age  46-20 Para  3  Other Problems Tawni Pummel, RN; 09/09/2019 7:23 AM) Anxiety Disorder  Atrial Fibrillation  Breast Cancer  Gastroesophageal Reflux Disease  High blood pressure  Home Oxygen Use  Hypercholesterolemia  Lump In Breast  Migraine Headache  Myocardial infarction  Sleep Apnea    Review of Systems Sunday Spillers Ledford RN; 09/09/2019 7:23 AM) General Present- Night Sweats. Not Present-  Appetite Loss, Chills, Fatigue, Fever, Weight Gain and Weight Loss. Skin Present- Non-Healing Wounds. Not Present- Change in Wart/Mole, Dryness, Hives, Jaundice, New Lesions, Rash and Ulcer. HEENT Present- Sore Throat. Not Present- Earache, Hearing Loss, Hoarseness, Nose Bleed, Oral Ulcers, Ringing in the Ears, Seasonal Allergies, Sinus Pain, Visual Disturbances, Wears glasses/contact lenses and Yellow Eyes. Respiratory Present- Difficulty Breathing, Snoring and Wheezing. Not Present- Bloody sputum and Chronic Cough. Breast Present- Breast Mass. Not Present- Breast Pain, Nipple Discharge and Skin Changes. Cardiovascular Present- Rapid Heart Rate. Not Present- Chest Pain, Difficulty Breathing Lying Down, Leg Cramps, Palpitations, Shortness of Breath and Swelling of Extremities. Gastrointestinal Present- Abdominal Pain, Excessive gas, Indigestion, Nausea and Vomiting. Not Present- Bloating, Bloody Stool, Change in Bowel Habits, Chronic diarrhea, Constipation, Difficulty Swallowing, Gets full quickly at meals, Hemorrhoids and Rectal Pain. Female Genitourinary Present- Frequency and Urgency. Not Present- Nocturia, Painful Urination and Pelvic Pain. Musculoskeletal Present- Back Pain. Not Present- Joint Pain, Joint Stiffness, Muscle Pain, Muscle Weakness and Swelling of Extremities. Neurological Present- Decreased Memory, Headaches, Numbness and Trouble walking. Not Present- Fainting, Seizures, Tingling, Tremor and Weakness. Psychiatric Present- Anxiety, Change in Sleep Pattern and Depression. Not Present- Bipolar, Fearful and Frequent crying. Endocrine Present- New Diabetes. Not Present- Cold Intolerance, Excessive Hunger, Hair Changes, Heat Intolerance and Hot flashes. Hematology Not Present- Blood Thinners, Easy Bruising, Excessive bleeding, Gland problems, HIV and Persistent Infections.   Physical Exam Rolm Bookbinder MD; 09/09/2019 2:35 PM) General Mental Status-Alert. Orientation-Oriented  X3. Head and Neck Trachea-midline. Eye Sclera/Conjunctiva - Bilateral-No scleral icterus. Chest and Lung Exam Chest and lung exam reveals -quiet, even and easy respiratory effort  with no use of accessory muscles. Breast Nipples-No Discharge. Breast Lump-No Palpable Breast Mass. Cardiovascular Cardiovascular examination reveals -normal heart sounds, regular rate and rhythm with no murmurs. Neurologic Neurologic evaluation reveals -alert and oriented x 3 with no impairment of recent or remote memory. Lymphatic Head & Neck General Head & Neck Lymphatics: Bilateral - Description - Normal. Axillary General Axillary Region: Bilateral - Description - Normal. Note: no Valders adenopathy   Assessment & Plan Rolm Bookbinder MD; 09/09/2019 3:01 PM) BREAST CANCER OF UPPER-OUTER QUADRANT OF LEFT FEMALE BREAST (C50.412) Story: Port, primary chemotherapy, likely lumpectomy/sn after chemo, no mri due to loop recorder We discussed the staging and pathophysiology of breast cancer. We discussed all of the different options for treatment for breast cancer including surgery, chemotherapy, radiation therapy, Herceptin, and antiestrogen therapy. discussed port placement on right side for chemotherapy, discussed risks. will schedule asap discussed later sn biopsy as well as lumpectomy/mastectomy. i think will be good candidate for lumpectomy and she understands this would require radiotherapy. plan to repeat mm/us at end of chemotherapy

## 2019-09-23 NOTE — Interval H&P Note (Signed)
History and Physical Interval Note:  09/23/2019 12:36 PM  Shelby Anderson  has presented today for surgery, with the diagnosis of BREAST CANCER.  The various methods of treatment have been discussed with the patient and family. After consideration of risks, benefits and other options for treatment, the patient has consented to  Procedure(s): INSERTION PORT-A-CATH WITH ULTRASOUND (N/A) as a surgical intervention.  The patient's history has been reviewed, patient examined, no change in status, stable for surgery.  I have reviewed the patient's chart and labs.  Questions were answered to the patient's satisfaction.     Rolm Bookbinder

## 2019-09-23 NOTE — Assessment & Plan Note (Deleted)
09/07/2019:Routine screening mammogram detected a 2.1cm mass in the left breast and no left axillary adenopathy. 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  Recommendations: 1.  Neoadjuvant chemotherapy with Taxotere and Cytoxan x6 cycles 2. Breast conserving surgery followed by 3. Adjuvant radiation therapy  ---------------------------------------------------------- We decided to do Taxotere and Cytoxan because she has a history of congestive heart failure and has comorbidities that can put her at increased risk of heart failure  Current treatment: Cycle 1 day 1 Taxotere and Cytoxan Labs reviewed Chemo class completed chemo consent obtained Antiemetics were reviewed  Return to clinic in 1 week for toxicity check

## 2019-09-23 NOTE — Anesthesia Procedure Notes (Signed)
Procedure Name: LMA Insertion Date/Time: 09/23/2019 1:11 PM Performed by: Valda Favia, CRNA Pre-anesthesia Checklist: Patient identified, Emergency Drugs available, Suction available and Patient being monitored Patient Re-evaluated:Patient Re-evaluated prior to induction Oxygen Delivery Method: Circle System Utilized Preoxygenation: Pre-oxygenation with 100% oxygen Induction Type: IV induction Ventilation: Mask ventilation without difficulty LMA: LMA inserted LMA Size: 4.0 Number of attempts: 1 Airway Equipment and Method: Bite block Placement Confirmation: positive ETCO2 Tube secured with: Tape Dental Injury: Teeth and Oropharynx as per pre-operative assessment

## 2019-09-23 NOTE — Discharge Instructions (Signed)
    PORT-A-CATH: POST OP INSTRUCTIONS  Always review your discharge instruction sheet given to you by the facility where your surgery was performed.   1. A prescription for pain medication may be given to you upon discharge. Take your pain medication as prescribed, if needed. If narcotic pain medicine is not needed, then you make take acetaminophen (Tylenol) or ibuprofen (Advil) as needed.  2. Take your usually prescribed medications unless otherwise directed. 3. If you need a refill on your pain medication, please contact our office. All narcotic pain medicine now requires a paper prescription.  Phoned in and fax refills are no longer allowed by law.  Prescriptions will not be filled after 5 pm or on weekends.  4. You should follow a light diet for the remainder of the day after your procedure. 5. Most patients will experience some mild swelling and/or bruising in the area of the incision. It may take several days to resolve. 6. It is common to experience some constipation if taking pain medication after surgery. Increasing fluid intake and taking a stool softener (such as Colace) will usually help or prevent this problem from occurring. A mild laxative (Milk of Magnesia or Miralax) should be taken according to package directions if there are no bowel movements after 48 hours.  7. Unless discharge instructions indicate otherwise, you may remove your bandages 48 hours after surgery, and you may shower at that time. You may have steri-strips (small white skin tapes) in place directly over the incision.  These strips should be left on the skin for 7-10 days.  If your surgeon used Dermabond (skin glue) on the incision, you may shower in 24 hours.  The glue will flake off over the next 2-3 weeks.  8. If your port is left accessed at the end of surgery (needle left in port), the dressing cannot get wet and should only by changed by a healthcare professional. When the port is no longer accessed (when the  needle has been removed), follow step 7.   9. ACTIVITIES:  Limit activity involving your arms for the next 72 hours. Do no strenuous exercise or activity for 1 week. You may drive when you are no longer taking prescription pain medication, you can comfortably wear a seatbelt, and you can maneuver your car. 10.You may need to see your doctor in the office for a follow-up appointment.  Please       check with your doctor.  11.When you receive a new Port-a-Cath, you will get a product guide and        ID card.  Please keep them in case you need them.  WHEN TO CALL YOUR DOCTOR ([phone]): 1. Fever over 101.0 2. Chills 3. Continued bleeding from incision 4. Increased redness and tenderness at the site 5. Shortness of breath, difficulty breathing   The clinic staff is available to answer your questions during regular business hours. Please don't hesitate to call and ask to speak to one of the nurses or medical assistants for clinical concerns. If you have a medical emergency, go to the nearest emergency room or call 911.  A surgeon from [facility] is always on call at the hospital.     For further information, please visit [email or web address]      

## 2019-09-23 NOTE — Transfer of Care (Signed)
Immediate Anesthesia Transfer of Care Note  Patient: Shelby Anderson  Procedure(s) Performed: INSERTION PORT-A-CATH Right Internal JugularWITH ULTRASOUND (N/A Chest)  Patient Location: PACU  Anesthesia Type:General  Level of Consciousness: awake and alert   Airway & Oxygen Therapy: Patient Spontanous Breathing and Patient connected to face mask oxygen  Post-op Assessment: Report given to RN and Post -op Vital signs reviewed and stable  Post vital signs: Reviewed and stable  Last Vitals:  Vitals Value Taken Time  BP 115/61 09/23/19 1405  Temp    Pulse 66 09/23/19 1408  Resp 14 09/23/19 1408  SpO2 100 % 09/23/19 1408  Vitals shown include unvalidated device data.  Last Pain:  Vitals:   09/23/19 1114  TempSrc:   PainSc: 5       Patients Stated Pain Goal: 2 ([id number])  Complications: No apparent anesthesia complications

## 2019-09-23 NOTE — Op Note (Signed)
Preoperative diagnosis:breast cancer need for venous access Postoperative diagnosis: saa Procedure: Right ij port placement with US guidance Surgeon: Dr Serita Grammes EBL: minimal Anesthesia: general  Complications none Drains none Specimens:none Sponge and needle count correct times two dispo to recovery stable  Indications: 89 yof with newly diagnosed breast cancer with planned primary chemotherapy. We discussed port placement.   Procedure:After informed consent was obtained the patient was taken to the operating room. She was given antibiotics. SCDs were placed. She was placed under general anesthesia without complication. She was prepped and draped in the standard sterile surgical fashion. A surgical timeout was then performed.  I identified the internal jugular vein on the right side with the ultrasound. I made a small nick in the skin. I accessed the internal jugular vein with the needle under ultrasound guidance. I passed the wire. The wire was confirmed to be in position with fluoroscopy. I confirmed the wire was in the IJ with the [place] as well. I then flushed infiltrated Marcaine below the clavicle. I made an incision and developed a subcutaneous pocket for the port. I then tunneled between the port site as well as the insertion site. I had to make the insertion site bigger to get the tunneler to pass. I brought the line through this. I then placed the dilator under fluoroscopic guidance over the wire. I remove the wire in the dilator. I then placed the line into the sheath. The sheath was then removed. I pulled the line back to be in the distal vena cava. I then hooked this up to the port. This was placed in the pocket and sutured in place with 2-0 Prolene suture. I then closed this with 3-0 Vicryl and 4-0 Monocryl. Glue was placed. I accessed this. It withdrew blood and flushed easily. I packed it with heparin.she was extubated and transferred to recovery  stable.

## 2019-09-24 ENCOUNTER — Encounter: Payer: Self-pay | Admitting: General Practice

## 2019-09-24 ENCOUNTER — Encounter: Payer: Self-pay | Admitting: Family Medicine

## 2019-09-24 ENCOUNTER — Inpatient Hospital Stay: Payer: Medicare Other

## 2019-09-24 ENCOUNTER — Encounter: Payer: Self-pay | Admitting: Adult Health

## 2019-09-24 ENCOUNTER — Inpatient Hospital Stay (HOSPITAL_BASED_OUTPATIENT_CLINIC_OR_DEPARTMENT_OTHER): Payer: Medicare Other | Admitting: Adult Health

## 2019-09-24 ENCOUNTER — Ambulatory Visit: Payer: Medicare Other | Admitting: Hematology and Oncology

## 2019-09-24 ENCOUNTER — Other Ambulatory Visit: Payer: Self-pay

## 2019-09-24 VITALS — BP 95/57 | HR 51 | Temp 97.8°F | Resp 14

## 2019-09-24 VITALS — BP 100/50 | HR 71 | Temp 98.2°F | Resp 18 | Ht 66.0 in | Wt 231.0 lb

## 2019-09-24 DIAGNOSIS — C50411 Malignant neoplasm of upper-outer quadrant of right female breast: Secondary | ICD-10-CM

## 2019-09-24 DIAGNOSIS — I2510 Atherosclerotic heart disease of native coronary artery without angina pectoris: Secondary | ICD-10-CM | POA: Diagnosis not present

## 2019-09-24 DIAGNOSIS — I252 Old myocardial infarction: Secondary | ICD-10-CM | POA: Diagnosis not present

## 2019-09-24 DIAGNOSIS — I509 Heart failure, unspecified: Secondary | ICD-10-CM | POA: Diagnosis not present

## 2019-09-24 DIAGNOSIS — K219 Gastro-esophageal reflux disease without esophagitis: Secondary | ICD-10-CM | POA: Diagnosis not present

## 2019-09-24 DIAGNOSIS — E1142 Type 2 diabetes mellitus with diabetic polyneuropathy: Secondary | ICD-10-CM | POA: Diagnosis not present

## 2019-09-24 DIAGNOSIS — Z79899 Other long term (current) drug therapy: Secondary | ICD-10-CM | POA: Diagnosis not present

## 2019-09-24 DIAGNOSIS — Z23 Encounter for immunization: Secondary | ICD-10-CM | POA: Diagnosis not present

## 2019-09-24 DIAGNOSIS — M1990 Unspecified osteoarthritis, unspecified site: Secondary | ICD-10-CM | POA: Diagnosis not present

## 2019-09-24 DIAGNOSIS — K760 Fatty (change of) liver, not elsewhere classified: Secondary | ICD-10-CM | POA: Diagnosis not present

## 2019-09-24 DIAGNOSIS — I110 Hypertensive heart disease with heart failure: Secondary | ICD-10-CM | POA: Diagnosis not present

## 2019-09-24 DIAGNOSIS — Z171 Estrogen receptor negative status [ER-]: Secondary | ICD-10-CM | POA: Diagnosis not present

## 2019-09-24 DIAGNOSIS — Z95828 Presence of other vascular implants and grafts: Secondary | ICD-10-CM

## 2019-09-24 DIAGNOSIS — I4891 Unspecified atrial fibrillation: Secondary | ICD-10-CM | POA: Diagnosis not present

## 2019-09-24 DIAGNOSIS — G4733 Obstructive sleep apnea (adult) (pediatric): Secondary | ICD-10-CM | POA: Diagnosis not present

## 2019-09-24 DIAGNOSIS — Z7982 Long term (current) use of aspirin: Secondary | ICD-10-CM | POA: Diagnosis not present

## 2019-09-24 DIAGNOSIS — E785 Hyperlipidemia, unspecified: Secondary | ICD-10-CM | POA: Diagnosis not present

## 2019-09-24 DIAGNOSIS — J45909 Unspecified asthma, uncomplicated: Secondary | ICD-10-CM | POA: Diagnosis not present

## 2019-09-24 DIAGNOSIS — M797 Fibromyalgia: Secondary | ICD-10-CM | POA: Diagnosis not present

## 2019-09-24 LAB — CMP (CANCER CENTER ONLY)
ALT: 30 U/L (ref 0–44)
AST: 28 U/L (ref 15–41)
Albumin: 3.2 g/dL — ABNORMAL LOW (ref 3.5–5.0)
Alkaline Phosphatase: 57 U/L (ref 38–126)
Anion gap: 9 (ref 5–15)
BUN: 10 mg/dL (ref 8–23)
CO2: 28 mmol/L (ref 22–32)
Calcium: 8.9 mg/dL (ref 8.9–10.3)
Chloride: 106 mmol/L (ref 98–111)
Creatinine: 0.72 mg/dL (ref 0.44–1.00)
GFR, Est AFR Am: >60 mL/min (ref >60)
GFR, Estimated: >60 mL/min (ref >60)
Glucose, Bld: 156 mg/dL — ABNORMAL HIGH (ref 70–99)
Potassium: 4.2 mmol/L (ref 3.5–5.1)
Sodium: 143 mmol/L (ref 135–145)
Total Bilirubin: 0.4 mg/dL (ref 0.3–1.2)
Total Protein: 7 g/dL (ref 6.5–8.1)

## 2019-09-24 LAB — CBC WITH DIFFERENTIAL (CANCER CENTER ONLY)
Abs Immature Granulocytes: 0.01 10*3/uL (ref 0.00–0.07)
Basophils Absolute: 0 10*3/uL (ref 0.0–0.1)
Basophils Relative: 0 %
Eosinophils Absolute: 0 10*3/uL (ref 0.0–0.5)
Eosinophils Relative: 0 %
HCT: 37.8 % (ref 36.0–46.0)
Hemoglobin: 11.9 g/dL — ABNORMAL LOW (ref 12.0–15.0)
Immature Granulocytes: 0 %
Lymphocytes Relative: 14 %
Lymphs Abs: 1.1 10*3/uL (ref 0.7–4.0)
MCH: 29.1 pg (ref 26.0–34.0)
MCHC: 31.5 g/dL (ref 30.0–36.0)
MCV: 92.4 fL (ref 80.0–100.0)
Monocytes Absolute: 0.4 10*3/uL (ref 0.1–1.0)
Monocytes Relative: 5 %
Neutro Abs: 6.5 10*3/uL (ref 1.7–7.7)
Neutrophils Relative %: 81 %
Platelet Count: 250 10*3/uL (ref 150–400)
RBC: 4.09 MIL/uL (ref 3.87–5.11)
RDW: 13.8 % (ref 11.5–15.5)
WBC Count: 8 10*3/uL (ref 4.0–10.5)
nRBC: 0 % (ref 0.0–0.2)

## 2019-09-24 MED ORDER — PALONOSETRON HCL INJECTION 0.25 MG/5ML
0.2500 mg | Freq: Once | INTRAVENOUS | Status: AC
  Administered 2019-09-24: 0.25 mg via INTRAVENOUS

## 2019-09-24 MED ORDER — SODIUM CHLORIDE 0.9 % IV SOLN
500.0000 mg/m2 | Freq: Once | INTRAVENOUS | Status: AC
  Administered 2019-09-24: 1100 mg via INTRAVENOUS
  Filled 2019-09-24: qty 55

## 2019-09-24 MED ORDER — HEPARIN SOD (PORK) LOCK FLUSH 100 UNIT/ML IV SOLN
500.0000 [IU] | Freq: Once | INTRAVENOUS | Status: AC | PRN
  Administered 2019-09-24: 500 [IU]
  Filled 2019-09-24: qty 5

## 2019-09-24 MED ORDER — INFLUENZA VAC A&B SA ADJ QUAD 0.5 ML IM PRSY: 0.5000 mL | PREFILLED_SYRINGE | Freq: Once | INTRAMUSCULAR | Status: DC

## 2019-09-24 MED ORDER — PEGFILGRASTIM 6 MG/0.6ML ~~LOC~~ PSKT
PREFILLED_SYRINGE | SUBCUTANEOUS | Status: AC
  Filled 2019-09-24: qty 0.6

## 2019-09-24 MED ORDER — SODIUM CHLORIDE 0.9 % IV SOLN
65.0000 mg/m2 | Freq: Once | INTRAVENOUS | Status: AC
  Administered 2019-09-24: 140 mg via INTRAVENOUS
  Filled 2019-09-24: qty 14

## 2019-09-24 MED ORDER — SODIUM CHLORIDE 0.9 % IV SOLN
Freq: Once | INTRAVENOUS | Status: AC
  Administered 2019-09-24: 10:00:00 via INTRAVENOUS
  Filled 2019-09-24: qty 5

## 2019-09-24 MED ORDER — INFLUENZA VAC SPLIT QUAD 0.5 ML IM SUSY
PREFILLED_SYRINGE | INTRAMUSCULAR | Status: AC
  Filled 2019-09-24: qty 0.5

## 2019-09-24 MED ORDER — PEGFILGRASTIM 6 MG/0.6ML ~~LOC~~ PSKT
6.0000 mg | PREFILLED_SYRINGE | Freq: Once | SUBCUTANEOUS | Status: AC
  Administered 2019-09-24: 6 mg via SUBCUTANEOUS

## 2019-09-24 MED ORDER — SODIUM CHLORIDE 0.9% FLUSH
10.0000 mL | INTRAVENOUS | Status: DC | PRN
  Administered 2019-09-24: 10 mL via INTRAVENOUS
  Filled 2019-09-24: qty 10

## 2019-09-24 MED ORDER — INFLUENZA VAC SPLIT QUAD 0.5 ML IM SUSY
0.5000 mL | PREFILLED_SYRINGE | Freq: Once | INTRAMUSCULAR | Status: AC
  Administered 2019-09-24: 0.5 mL via INTRAMUSCULAR

## 2019-09-24 MED ORDER — SODIUM CHLORIDE 0.9 % IV SOLN
Freq: Once | INTRAVENOUS | Status: AC
  Administered 2019-09-24: 09:00:00 via INTRAVENOUS
  Filled 2019-09-24: qty 250

## 2019-09-24 MED ORDER — PALONOSETRON HCL INJECTION 0.25 MG/5ML
INTRAVENOUS | Status: AC
  Filled 2019-09-24: qty 5

## 2019-09-24 MED ORDER — SODIUM CHLORIDE 0.9% FLUSH
10.0000 mL | INTRAVENOUS | Status: DC | PRN
  Administered 2019-09-24: 10 mL
  Filled 2019-09-24: qty 10

## 2019-09-24 NOTE — Patient Instructions (Signed)
[facility] Discharge Instructions for Patients Receiving Chemotherapy  Today you received the following chemotherapy agents taxotere and carboplatin To help prevent nausea and vomiting after your treatment, we encourage you to take your nausea medication as prescribed.   If you develop nausea and vomiting that is not controlled by your nausea medication, call the clinic.   BELOW ARE SYMPTOMS THAT SHOULD BE REPORTED IMMEDIATELY:  *FEVER GREATER THAN 100.5 F  *CHILLS WITH OR WITHOUT FEVER  NAUSEA AND VOMITING THAT IS NOT CONTROLLED WITH YOUR NAUSEA MEDICATION  *UNUSUAL SHORTNESS OF BREATH  *UNUSUAL BRUISING OR BLEEDING  TENDERNESS IN MOUTH AND THROAT WITH OR WITHOUT PRESENCE OF ULCERS  *URINARY PROBLEMS  *BOWEL PROBLEMS  UNUSUAL RASH Items with * indicate a potential emergency and should be followed up as soon as possible.  Feel free to call the clinic should you have any questions or concerns. The clinic phone number is [phone].  Please show the [facility] at check-in to the Emergency Department and triage nurse.

## 2019-09-24 NOTE — Progress Notes (Signed)
[facility] Spiritual Care Note  Followed up with Shelby Anderson at her first infusion to bring encouragement and care in the form of a handmade prayer shawl, for which she verbalized gratitude. Seeing a familiar representative of [facility] seemed to bring comfort and reassurance. We plan to f/u by phone next week for a more detailed conversation after she has had a few days to cope with tx.   [place], [place], [facility] Pager [phone] Voicemail [phone]

## 2019-09-24 NOTE — Assessment & Plan Note (Addendum)
09/07/2019:Routine screening mammogram detected a 2.1cm mass in the left breast and no left axillary adenopathy. 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  Pathology and radiology counseling:Discussed with the patient, the details of pathology including the type of breast cancer,the clinical staging, the significance of ER, PR and HER-2/neu receptors and the implications for treatment. After reviewing the pathology in detail, we proceeded to discuss the different treatment options between surgery, radiation, chemotherapy, antiestrogen therapies.  Recommendations: 1.  Neoadjuvant chemotherapy with Taxotere/Cytoxan x 6 starting 09/24/2019 2. Breast conserving surgery followed by 3. Adjuvant radiation therapy   Chemotherapy Counseling:   We reviewed the Taxotere/Cytoxan in detail.  She has her anti emetics and is ready to proceed with treatment.  She understands the risks and benefits of treatment and is willing to proceed.  I gave her an anti nausea map.  She understands how to take her meds.  She and I reviewed that she should call us if she develops any side effects of the medications.  She understands this.  We discussed her GCSF injection with Fulphila (neulasta biosimilar) on 09/26/2019.  We reviewed that because it is stimulating the WBC to increase, it can cause bone pain, and that she should take tylenol if she experiences this.  She knows to call us if she has pain not relieved by tylenol.    I recommended she stay active with healthy diet and exercise.    We will see Kessler back in one year for labs, and follow up.

## 2019-09-24 NOTE — Progress Notes (Signed)
Hypotension-Per Dr Lindi Adie it is okay to proceed with Taxotere and Cytoxan and BP .

## 2019-09-24 NOTE — Progress Notes (Signed)
[facility] Follow up:    Shelby Kern, DO [place] [address]   DIAGNOSIS: Cancer Staging Malignant neoplasm of upper-outer quadrant of right breast in female, estrogen receptor negative ([place]) Staging form: Breast, AJCC 8th Edition - Clinical stage from 09/09/2019: Stage IIB (cT2, cN0, cM0, G3, ER-, PR-, HER2-) - Unsigned   SUMMARY OF ONCOLOGIC HISTORY: Oncology History  Malignant neoplasm of upper-outer quadrant of right breast in female, estrogen receptor negative ([place])  09/07/2019 Initial Diagnosis   Routine screening mammogram detected a 2.1cm mass in the left breast and no left axillary adenopathy. 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   09/24/2019 -  Chemotherapy   The patient had palonosetron (ALOXI) injection 0.25 mg, 0.25 mg, Intravenous,  Once, 0 of 6 cycles pegfilgrastim-jmdb (FULPHILA) injection 6 mg, 6 mg, Subcutaneous,  Once, 0 of 6 cycles cyclophosphamide (CYTOXAN) 1,100 mg in sodium chloride 0.9 % 250 mL chemo infusion, 500 mg/m2 = 1,100 mg (83.3 % of original dose 600 mg/m2), Intravenous,  Once, 0 of 6 cycles Dose modification: 500 mg/m2 (original dose 600 mg/m2, Cycle 1, Reason: Provider Judgment) DOCEtaxel (TAXOTERE) 140 mg in sodium chloride 0.9 % 250 mL chemo infusion, 65 mg/m2 = 140 mg (86.7 % of original dose 75 mg/m2), Intravenous,  Once, 0 of 6 cycles Dose modification: 65 mg/m2 (original dose 75 mg/m2, Cycle 1, Reason: Provider Judgment) fosaprepitant (EMEND) 150 mg in sodium chloride 0.9 % 145 mL IVPB, , Intravenous,  Once, 0 of 6 cycles  for chemotherapy treatment.      CURRENT THERAPY: Taxotere/Cytoxan  INTERVAL HISTORY: Shelby Anderson 63 y.o. female returns for evaluation prior to chemotherapy.  She has picked up her anti emetics.  She understands how to take her anti emetics.  She has undergone chemotherapy education.  She is feeling well today.  She lives at home with  two grandchildren, her husband.  She has custody of her grandchildren who are 79 and 38 years old.  She walks a couple of times a day.    She underwent port placement yesterday and notes it went well.  She does have some soreness present.     Patient Active Problem List   Diagnosis Date Noted  . Genetic testing 09/17/2019  . Family history of melanoma   . Family history of pancreatic cancer   . Malignant neoplasm of upper-outer quadrant of right breast in female, estrogen receptor negative ([place]) 09/07/2019  . Encounter for loop recorder at end of battery life 04/24/2018  . Esophageal stricture 07/01/2017  . Hyperlipidemia associated with type 2 diabetes mellitus ([place]) 05/20/2017  . Allergic rhinitis 03/11/2017  . Dilated cardiomyopathy ([place]) 02/07/2017  . 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 Morbid obesity due to excess calories ([place]) 09/26/2016  . S/P left TKA 09/25/2016  . Increased endometrial stripe thickness 06/27/2016  . Intramural leiomyoma of uterus 06/27/2016  . Type 2 diabetes mellitus without complication, without long-term current use of insulin ([place]) 12/06/2015  . Chronic respiratory failure ([place]) 09/15/2015  . Dyspnea 09/01/2015  . Hypertension associated with diabetes ([place]) 07/19/2015  . Arrhythmia 07/19/2015  . Orthostatic hypotension 07/19/2015  . GERD (gastroesophageal reflux disease) 07/19/2015  . Chronic back pain 07/19/2015  . Neuropathy 07/19/2015  . Symptomatic PVCs 11/02/2014  . Syncope 11/02/2014  . Chest pain 12/23/2013  . Disorder of cervix 03/10/2013  . Vaginal atrophy 03/10/2013  . OSA (obstructive sleep  apnea) 07/31/2012  . Coronary artery disease 11/20/2011    is allergic to lodine [etodolac]; oxycontin [oxycodone hcl]; penicillins; aspirin; darvocet [propoxyphene n-acetaminophen]; nitroglycerin; tramadol; ultram [tramadol hcl]; and valium.  MEDICAL HISTORY: Past Medical History:  Diagnosis Date  . Allergy   . Anemia     "chronic"  . Angina   . Anxiety and depression   . Arthritis   . Asthma   . Atrial fibrillation ([place])    h/o "AF w/frequent PVCs"  . Breast cancer ([place])    Left  . Cancer (HCC)    hx of skin cancer   . CHF (congestive heart failure) ([place])   . Chronic back pain greater than 3 months duration    on chronic narcotics, treated at pain clinic  . Colon polyps    hyperplastic  . Coronary artery disease    Arrythmia, orthostatic hypotension, HLD, HTN; sees Dr. Einar Gip  . Difficult intubation    "TMJ & woke up when they were still cutting on me"  . Dysrhythmia    sees Dr. Einar Gip and a cardiologist at [facility]  . Esophageal stricture   . Family history of melanoma ]  . Family history of pancreatic cancer   . Fatty liver   . Fibroids   . Fibromyalgia    "in my legs"  . GERD (gastroesophageal reflux disease)    hx hiatal hernia, stricture and gastric ulcer  . Headache(784.0)   . Heart murmur   . Hiatal hernia   . History of loop recorder   . History of migraines    "dx'd when I was in my teens"  . Hyperlipemia   . Hypertension   . Mental disorder   . Mild episode of recurrent major depressive disorder ([place]) 12/06/2015  . Myocardial infarction [facility]) 1980's & 1990;   sees Dr. Einar Gip  . OSA (obstructive sleep apnea)   . Pneumonia   . PONV (postoperative nausea and vomiting)   . Recurrent upper respiratory infection (URI)   . Shortness of breath 11/20/11   "all the time", sees pulmonlogy, ? asthma  . Stenotic cervical os   . Stomach ulcer    "3 small; found in 05/2011"  . TMJ (dislocation of temporomandibular joint)   . Tuberculosis    + TB SKIN TEST  . Type 2 diabetes mellitus without complication, without long-term current use of insulin ([place]) 12/06/2015   not on meds     SURGICAL HISTORY: Past Surgical History:  Procedure Laterality Date  . ACHILLES TENDON REPAIR  1970's   left ankle  . ARTHROSCOPIC REPAIR ACL     left knee cap  . BREAST BIOPSY Right 04/08/2013  .  CARDIAC CATHETERIZATION     loop recorder  . CARPAL TUNNEL RELEASE  unknown   left hand  . ESOPHAGOGASTRODUODENOSCOPY (EGD) WITH PROPOFOL N/A 10/29/2017   Procedure: ESOPHAGOGASTRODUODENOSCOPY (EGD) WITH PROPOFOL;  Surgeon: Irene Shipper, MD;  Location: [facility];  Service: Endoscopy;  Laterality: N/A;  . LOOP RECORDER IMPLANT    . post ganglionectomy  1970's   "for migraine headaches"  . pouch string  [phone]   "did this 3 times (once w/each pregnancy)"  . SAVORY DILATION N/A 10/29/2017   Procedure: SAVORY DILATION;  Surgeon: Irene Shipper, MD;  Location: [facility];  Service: Endoscopy;  Laterality: N/A;  . TOTAL KNEE ARTHROPLASTY Left 09/25/2016   Procedure: LEFT TOTAL KNEE ARTHROPLASTY;  Surgeon: Paralee Cancel, MD;  Location: [facility];  Service: Orthopedics;  Laterality: Left;  .  TUBAL LIGATION  1980's    SOCIAL HISTORY: Social History   Socioeconomic History  . Marital status: Married    Spouse name: Not on file  . Number of children: 3  . Years of education: 20  . Highest education level: Not on file  Occupational History  . Occupation: Retired Animal nutritionist  . Financial resource strain: Not on file  . Food insecurity    Worry: Not on file    Inability: Not on file  . Transportation needs    Medical: Not on file    Non-medical: Not on file  Tobacco Use  . Smoking status: Never Smoker  . Smokeless tobacco: Never Used  Substance and Sexual Activity  . Alcohol use: No    Alcohol/week: 0.0 standard drinks  . Drug use: No  . Sexual activity: Not Currently    Birth control/protection: Surgical    Comment: 1st intercourse 63 yo-Fewer than 5 partners  Lifestyle  . Physical activity    Days per week: Not on file    Minutes per session: Not on file  . Stress: Not on file  Relationships  . Social Herbalist on phone: Not on file    Gets together: Not on file    Attends religious service: Not on file    Active member of club or organization: Not on  file    Attends meetings of clubs or organizations: Not on file    Relationship status: Not on file  . Intimate partner violence    Fear of current or ex partner: Not on file    Emotionally abused: Not on file    Physically abused: Not on file    Forced sexual activity: Not on file  Other Topics Concern  . Not on file  Social History Narrative   Right handed   One story home   3 children    FAMILY HISTORY: Family History  Problem Relation Age of Onset  . Malignant hyperthermia Father   . Hypertension Father   . Heart disease Father   . Diabetes Father   . Cancer Father        skin  . Hypertension Mother   . Heart disease Mother   . Multiple myeloma Mother   . Cancer Sister        CERVICAL  . Hypertension Sister   . Cancer Brother 22       MELANOMA  . Heart disease Maternal Grandmother   . Other Maternal Grandmother 32       complications of childbirth  . Heart disease Maternal Grandfather   . Cancer Paternal Grandmother        ?   Marland Kitchen Heart disease Paternal Grandmother   . Heart disease Paternal Grandfather   . Cancer Brother        LUNG  . Diabetes Sister   . Hypertension Sister   . Heart disease Sister   . Cancer Sister   . Cancer Brother   . Pancreatic cancer Niece 34  . Cancer Nephew 40       unknown- currently in the [employer]  . Anesthesia problems Neg Hx   . Hypotension Neg Hx   . Pseudochol deficiency Neg Hx   . Colon cancer Neg Hx   . Esophageal cancer Neg Hx   . Rectal cancer Neg Hx   . Stomach cancer Neg Hx   . Breast cancer Neg Hx     Review of Systems  Constitutional: Negative  for appetite change, chills, fatigue, fever and unexpected weight change.  HENT:   Negative for hearing loss, lump/mass and trouble swallowing.   Eyes: Negative for eye problems and icterus.  Respiratory: Negative for chest tightness, cough and shortness of breath.   Cardiovascular: Negative for chest pain, leg swelling and palpitations.  Gastrointestinal: Negative  for abdominal distention, abdominal pain, constipation, diarrhea, nausea and vomiting.  Endocrine: Negative for hot flashes.  Genitourinary: Negative for difficulty urinating.   Musculoskeletal: Negative for arthralgias.  Skin: Negative for itching and rash.  Neurological: Negative for dizziness, extremity weakness, headaches and numbness.  Hematological: Negative for adenopathy. Does not bruise/bleed easily.  Psychiatric/Behavioral: Negative for depression. The patient is not nervous/anxious.       PHYSICAL EXAMINATION  ECOG PERFORMANCE STATUS: 1 - Symptomatic but completely ambulatory  Vitals:   09/24/19 0834  BP: (!) 100/50  Pulse: 71  Resp: 18  Temp: 98.2 F (36.8 C)  SpO2: 92%    Physical Exam Constitutional:      General: She is not in acute distress.    Appearance: Normal appearance. She is not toxic-appearing.  HENT:     Head: Normocephalic and atraumatic.     Mouth/Throat:     Mouth: Mucous membranes are moist.     Pharynx: Oropharynx is clear. No oropharyngeal exudate or posterior oropharyngeal erythema.  Eyes:     General: No scleral icterus.    Pupils: Pupils are equal, round, and reactive to light.  Neck:     Musculoskeletal: Normal range of motion and neck supple.  Cardiovascular:     Rate and Rhythm: Normal rate and regular rhythm.     Pulses: Normal pulses.     Heart sounds: Normal heart sounds.  Pulmonary:     Effort: Pulmonary effort is normal.     Breath sounds: Normal breath sounds.     Comments: Cannot palpate left breast mass Abdominal:     General: Abdomen is flat. There is no distension.     Palpations: Abdomen is soft.     Tenderness: There is no abdominal tenderness.  Musculoskeletal:        General: No swelling.  Lymphadenopathy:     Cervical: No cervical adenopathy.  Skin:    General: Skin is warm and dry.     Capillary Refill: Capillary refill takes less than 2 seconds.     Findings: No rash.  Neurological:     General: No focal  deficit present.     Mental Status: She is alert and oriented to person, place, and time.  Psychiatric:        Mood and Affect: Mood normal.        Behavior: Behavior normal.     LABORATORY DATA:  CBC    Component Value Date/Time   WBC 7.3 09/09/2019 1228   WBC 6.0 12/02/2018 1009   RBC 4.83 09/09/2019 1228   HGB 13.8 09/09/2019 1228   HCT 44.1 09/09/2019 1228   PLT 264 09/09/2019 1228   MCV 91.3 09/09/2019 1228   MCH 28.6 09/09/2019 1228   MCHC 31.3 09/09/2019 1228   RDW 13.9 09/09/2019 1228   LYMPHSABS 2.0 09/09/2019 1228   MONOABS 0.5 09/09/2019 1228   EOSABS 0.2 09/09/2019 1228   BASOSABS 0.0 09/09/2019 1228    CMP     Component Value Date/Time   NA 144 09/09/2019 1228   K 4.0 09/09/2019 1228   CL 104 09/09/2019 1228   CO2 31 09/09/2019 1228   GLUCOSE 99  09/09/2019 1228   BUN 11 09/09/2019 1228   CREATININE 0.68 09/09/2019 1228   CALCIUM 9.3 09/09/2019 1228   PROT 7.5 09/09/2019 1228   ALBUMIN 3.5 09/09/2019 1228   AST 31 09/09/2019 1228   ALT 26 09/09/2019 1228   ALKPHOS 74 09/09/2019 1228   BILITOT 0.3 09/09/2019 1228   GFRNONAA >60 09/09/2019 1228   GFRAA >60 09/09/2019 1228     RADIOGRAPHIC STUDIES:  Dg Chest Port 1 View  Result Date: 09/23/2019 CLINICAL DATA:  63 year old female status post Port-A-Cath placement. EXAM: PORTABLE CHEST 1 VIEW COMPARISON:  Chest radiographs 07/15/2018 and earlier. FINDINGS: Portable AP upright view at 1415 hours. Right chest IJ approach porta cath in place, power port and currently accessed. Catheter tip is at the lower SVC level just below the carina. No adverse features. Lower lung volumes. Mild atelectasis. No pneumothorax. Stable cardiac size and mediastinal contours. Stable left chest cardiac event recorder. Visualized tracheal air column is within normal limits. Paucity of bowel gas in the upper abdomen. Degenerative changes at both shoulders. No acute osseous abnormality identified. IMPRESSION: 1. Right chest power  port placed with no adverse features. 2. Lower lung volumes with mild atelectasis. Electronically Signed   By: Genevie Ann M.D.   On: 09/23/2019 14:27     ASSESSMENT and THERAPY PLAN:   Malignant neoplasm of upper-outer quadrant of right breast in female, estrogen receptor negative ([place]) 09/07/2019:Routine screening mammogram detected a 2.1cm mass in the left breast and no left axillary adenopathy. 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  Pathology and radiology counseling:Discussed with the patient, the details of pathology including the type of breast cancer,the clinical staging, the significance of ER, PR and HER-2/neu receptors and the implications for treatment. After reviewing the pathology in detail, we proceeded to discuss the different treatment options between surgery, radiation, chemotherapy, antiestrogen therapies.  Recommendations: 1.  Neoadjuvant chemotherapy with Taxotere/Cytoxan x 6 starting 09/24/2019 2. Breast conserving surgery followed by 3. Adjuvant radiation therapy   Chemotherapy Counseling:   We reviewed the Taxotere/Cytoxan in detail.  She has her anti emetics and is ready to proceed with treatment.  She understands the risks and benefits of treatment and is willing to proceed.  I gave her an anti nausea map.  She understands how to take her meds.  She and I reviewed that she should call us if she develops any side effects of the medications.  She understands this.  We discussed her GCSF injection with Fulphila (neulasta biosimilar) on 09/26/2019.  We reviewed that because it is stimulating the WBC to increase, it can cause bone pain, and that she should take tylenol if she experiences this.  She knows to call us if she has pain not relieved by tylenol.    I recommended she stay active with healthy diet and exercise.    We will see Gillie back in one year for labs, and follow up.    All questions were answered. The patient knows to call the  clinic with any problems, questions or concerns. We can certainly see the patient much sooner if necessary.  A total of (30) minutes of face-to-face time was spent with this patient with greater than 50% of that time in counseling and care-coordination.  This note was electronically signed. Scot Dock, NP 09/24/2019

## 2019-09-25 ENCOUNTER — Telehealth: Payer: Self-pay | Admitting: Hematology and Oncology

## 2019-09-25 ENCOUNTER — Encounter (HOSPITAL_COMMUNITY): Payer: Self-pay | Admitting: General Surgery

## 2019-09-25 ENCOUNTER — Telehealth: Payer: Self-pay | Admitting: *Deleted

## 2019-09-25 ENCOUNTER — Other Ambulatory Visit: Payer: Self-pay

## 2019-09-25 ENCOUNTER — Encounter: Payer: Medicare Other | Admitting: Gynecology

## 2019-09-25 ENCOUNTER — Telehealth: Payer: Self-pay

## 2019-09-25 DIAGNOSIS — C50411 Malignant neoplasm of upper-outer quadrant of right female breast: Secondary | ICD-10-CM

## 2019-09-25 DIAGNOSIS — Z171 Estrogen receptor negative status [ER-]: Secondary | ICD-10-CM

## 2019-09-25 NOTE — Telephone Encounter (Signed)
RN placed call to follow up with patient.  Pt still with nausea, minimal vomiting on 10/22.  Pt reports being able to tolerated small amounts of fluid.    RN reviewed antiemetics with patient.  Pt has been able to take PO ativan and compazine with no emesis.  RN encouraged continued use of anti emetics are prescribed.  Pt educated on s/s of when to report to ED.  Pt has been set up for SM evaluation on 10/26.

## 2019-09-25 NOTE — Telephone Encounter (Signed)
R/s appt per 10/23 sch message - pt is aware of appt updated and will get an updated schedule next visit on 10/29

## 2019-09-25 NOTE — Telephone Encounter (Signed)
Called pt to see how she tolerated her 1st chemo. Relate she had N/V last night, did take compazine. She had not had any vomiting today, has taken compazine and is able to tolerate fluids. Asked pt if she would like to come in to [facility] and receive IVF. Pt denies needing to do so at this time and will try lorazepam. Instructed pt that it may make her sleepy and not to drive after taking it. Pt will call triage nurse if she does not have relief of nausea.

## 2019-09-26 ENCOUNTER — Ambulatory Visit: Payer: Medicare Other

## 2019-09-27 NOTE — Anesthesia Postprocedure Evaluation (Signed)
Anesthesia Post Note  Patient: Shelby Anderson  Procedure(s) Performed: INSERTION PORT-A-CATH Right Internal JugularWITH ULTRASOUND (N/A Chest)     Anesthesia Post Evaluation  Last Vitals:  Vitals:   09/23/19 1449 09/23/19 1515  BP: 116/75   Pulse: (!) 57   Resp: 18   Temp: 36.7 C   SpO2: 95% 97%    Last Pain:  Vitals:   09/23/19 1514  TempSrc:   PainSc: 8                  Haruko Mersch

## 2019-09-28 ENCOUNTER — Encounter: Payer: Medicare Other | Admitting: Medical

## 2019-09-28 ENCOUNTER — Other Ambulatory Visit: Payer: Medicare Other

## 2019-09-28 ENCOUNTER — Encounter: Payer: Self-pay | Admitting: *Deleted

## 2019-09-28 ENCOUNTER — Telehealth: Payer: Self-pay | Admitting: Emergency Medicine

## 2019-09-28 ENCOUNTER — Other Ambulatory Visit: Payer: Self-pay | Admitting: Family Medicine

## 2019-09-28 NOTE — Telephone Encounter (Signed)
Pt was advised by pharmacy that request have been sent to office with no response/ Pt is completely out of  metFORMIN (GLUCOPHAGE) 500 MG tablet  And needs a refill asap/ please advise

## 2019-09-28 NOTE — Progress Notes (Deleted)
[facility]pt[facility] Management Clinic Progress Note   Shelby Anderson [id number] February 03, 1956 63 y.o.  Shelby Anderson is managed by Dr. Nicholas Lose  Actively treated with chemotherapy/immunotherapy/hormonal therapy: yes  Current therapy: taxotere and carboplatin  Last treated: 09/24/2019 (cycle ***)  Next scheduled appointment with provider: 10/01/2019  Assessment: Plan:    No diagnosis found.   Malignant neoplasm of upper-outer quadrant of right breast in female, estrogen receptor negative: The patient is followed by Dr. Lindi Adie and is status post cycle *** of  taxotere and carboplatin which was dosed on 09/24/2019. She will be seen in follow up on 10/01/2019.  Nausea and vomiting: ***  Please see After Visit Summary for patient specific instructions.  Future Appointments  Date Time Provider [place]  09/28/2019 10:00 AM [facility] LAB 4 [facility] None  09/28/2019 10:30 AM Anyjah Roundtree, Lucianne Lei E., PA-C [facility] None  10/01/2019  2:15 PM [facility] LAB 1 [facility] None  10/01/2019  2:30 PM [facility] [place] FLUSH [facility] None  10/01/2019  3:00 PM Nicholas Lose, MD [facility] None  10/15/2019  9:30 AM [facility] LAB 3 [facility] None  10/15/2019  9:45 AM [facility] [place] FLUSH [facility] None  10/15/2019 10:15 AM Nicholas Lose, MD [facility] None  10/15/2019 11:15 AM [facility] INFUSION [facility] None  10/16/2019  3:00 PM Caren Macadam, MD [facility]  10/17/2019 11:45 AM [facility] [place] FLUSH [facility] None  10/26/2019  1:00 PM Pleasant, Eppie Gibson, RN [facility] None  11/05/2019 10:00 AM [facility] LAB 3 [facility] None  11/05/2019 10:15 AM [facility] [place] FLUSH [facility] None  11/05/2019 10:45 AM Nicholas Lose, MD [facility] None  11/05/2019 11:30 AM [facility] INFUSION [facility] None  11/07/2019 11:30 AM [facility] [place] FLUSH [facility] None  12/25/2019  2:30 PM Patel, Donika K, DO [facility] None  12/25/2019  4:15 PM [place], Stephani Police, DPM [facility]      No orders of the defined types were placed in this encounter.      Subjective:   Patient ID:  Shelby Anderson is a 63 y.o. (DOB Jun 06, 1956) female.  Chief Complaint: No chief complaint on file.   HPI Shelby Anderson Is a 63 y.o. female with a diagnosis of an ER negative malignant neoplasm of upper-outer quadrant of right breast. She is followed by Dr. Lindi Adie and is status post cycle *** of  taxotere and carboplatin which was dosed on 09/24/2019. She was contacted by our office on 09/25/2019. She reported ongoing nausea with minimal vomiting. She was able to tolerated small amounts of fluid. When she takes PO ativan and compazine she has no emesis.    Medications: I have reviewed the patient's current medications.  Allergies:  Allergies  Allergen Reactions   Lodine [Etodolac] Anaphylaxis, Hives and Swelling   Oxycontin [Oxycodone Hcl] Anaphylaxis    hives, trouble breathing, tongue swelling (Only Oxycontin) Tolerates plain oxycodone.   Penicillins Anaphylaxis    Told by a surgeon never to take it again. Has patient had a PCN reaction causing immediate rash, facial/tongue/throat swelling, SOB or lightheadedness with hypotension: Yes Has patient had a PCN reaction causing severe rash involving mucus membranes or skin necrosis: Unknown Has patient had a PCN reaction that required hospitalization: No Has patient had a PCN reaction occurring within the last 10 years: No If all of the above answers are "NO", then may proceed with Cephalosporin use.   Aspirin Other (See Comments)    High-dose caused GI Bleeds   Darvocet [Propoxyphene N-Acetaminophen] Hives   Nitroglycerin     IV-BP  drops dramatically   Tramadol Hives and Itching   Ultram [Tramadol Hcl] Hives   Valium Other (See Comments)    Circulation problems. "Legs turned black".    Past Medical History:  Diagnosis Date   Allergy    Anemia    "chronic"   Angina    Anxiety and depression     Arthritis    Asthma    Atrial fibrillation ([place])    h/o "AF w/frequent PVCs"   Breast cancer ([place])    Left   Cancer ([place])    hx of skin cancer    CHF (congestive heart failure) (HCC)    Chronic back pain greater than 3 months duration    on chronic narcotics, treated at pain clinic   Colon polyps    hyperplastic   Coronary artery disease    Arrythmia, orthostatic hypotension, HLD, HTN; sees Dr. Einar Gip   Difficult intubation    "TMJ & woke up when they were still cutting on me"   Dysrhythmia    sees Dr. Einar Gip and a cardiologist at [facility]   Esophageal stricture    Family history of melanoma ]   Family history of pancreatic cancer    Fatty liver    Fibroids    Fibromyalgia    "in my legs"   GERD (gastroesophageal reflux disease)    hx hiatal hernia, stricture and gastric ulcer   Headache(784.0)    Heart murmur    Hiatal hernia    History of loop recorder    History of migraines    "dx'd when I was in my teens"   Hyperlipemia    Hypertension    Mental disorder    Mild episode of recurrent major depressive disorder ([place]) 12/06/2015   Myocardial infarction [facility]) 1980's & 1990;   sees Dr. Einar Gip   OSA (obstructive sleep apnea)    Pneumonia    PONV (postoperative nausea and vomiting)    Recurrent upper respiratory infection (URI)    Shortness of breath 11/20/11   "all the time", sees pulmonlogy, ? asthma   Stenotic cervical os    Stomach ulcer    "3 small; found in 05/2011"   TMJ (dislocation of temporomandibular joint)    Tuberculosis    + TB SKIN TEST   Type 2 diabetes mellitus without complication, without long-term current use of insulin ([place]) 12/06/2015   not on meds     Past Surgical History:  Procedure Laterality Date   ACHILLES TENDON REPAIR  1970's   left ankle   ARTHROSCOPIC REPAIR ACL     left knee cap   BREAST BIOPSY Right 04/08/2013   CARDIAC CATHETERIZATION     loop recorder   CARPAL TUNNEL RELEASE  unknown    left hand   ESOPHAGOGASTRODUODENOSCOPY (EGD) WITH PROPOFOL N/A 10/29/2017   Procedure: ESOPHAGOGASTRODUODENOSCOPY (EGD) WITH PROPOFOL;  Surgeon: Irene Shipper, MD;  Location: [facility];  Service: Endoscopy;  Laterality: N/A;   LOOP RECORDER IMPLANT     PORTACATH PLACEMENT N/A 09/23/2019   Procedure: INSERTION PORT-A-CATH Right Internal Doreen Salvage;  Surgeon: Rolm Bookbinder, MD;  Location: [place];  Service: General;  Laterality: N/A;   post ganglionectomy  1970's   "for migraine headaches"   pouch string  [age]   "did this 3 times (once w/each pregnancy)"   SAVORY DILATION N/A 10/29/2017   Procedure: SAVORY DILATION;  Surgeon: Irene Shipper, MD;  [facility];  Service: Endoscopy;  Laterality: N/A;   TOTAL KNEE ARTHROPLASTY Left  09/25/2016   Procedure: LEFT TOTAL KNEE ARTHROPLASTY;  Surgeon: Paralee Cancel, MD;  Location: [facility];  Service: Orthopedics;  Laterality: Left;   TUBAL LIGATION  34's    Family History  Problem Relation Age of Onset   Malignant hyperthermia Father    Hypertension Father    Heart disease Father    Diabetes Father    Cancer Father        skin   Hypertension Mother    Heart disease Mother    Multiple myeloma Mother    Cancer Sister        CERVICAL   Hypertension Sister    Cancer Brother 57       MELANOMA   Heart disease Maternal Grandmother    Other Maternal Grandmother 32       complications of childbirth   Heart disease Maternal Grandfather    Cancer Paternal Grandmother        ?    Heart disease Paternal Grandmother    Heart disease Paternal Grandfather    Cancer Brother        LUNG   Diabetes Sister    Hypertension Sister    Heart disease Sister    Cancer Sister    Cancer Brother    Pancreatic cancer Niece 54   Cancer Nephew 36       unknown- currently in the [employer]   Anesthesia problems Neg Hx    Hypotension Neg Hx    Pseudochol deficiency Neg Hx    Colon cancer Neg  Hx    Esophageal cancer Neg Hx    Rectal cancer Neg Hx    Stomach cancer Neg Hx    Breast cancer Neg Hx     Social History   Socioeconomic History   Marital status: Married    Spouse name: Not on file   Number of children: 3   Years of education: 15   Highest education level: Not on file  Occupational History   Occupation: Retired Designer, multimedia strain: Not on file   Food insecurity    Worry: Not on file    Inability: Not on Lexicographer needs    Medical: Not on file    Non-medical: Not on file  Tobacco Use   Smoking status: Never Smoker   Smokeless tobacco: Never Used  Substance and Sexual Activity   Alcohol use: No    Alcohol/week: 0.0 standard drinks   Drug use: No   Sexual activity: Not Currently    Birth control/protection: Surgical    Comment: 1st intercourse 63 yo-Fewer than 5 partners  Lifestyle   Physical activity    Days per week: Not on file    Minutes per session: Not on file   Stress: Not on file  Relationships   Social connections    Talks on phone: Not on file    Gets together: Not on file    Attends religious service: Not on file    Active member of club or organization: Not on file    Attends meetings of clubs or organizations: Not on file    Relationship status: Not on file   Intimate partner violence    Fear of current or ex partner: Not on file    Emotionally abused: Not on file    Physically abused: Not on file    Forced sexual activity: Not on file  Other Topics Concern   Not on file  Social History Narrative  Right handed   One story home   3 children    Past Medical History, Surgical history, Social history, and Family history were reviewed and updated as appropriate.   Please see review of systems for further details on the patient's review from today.   Review of Systems:  Review of Systems  Constitutional: Positive for appetite change. Negative for chills, diaphoresis  and fever.  HENT: Negative for trouble swallowing.   Respiratory: Negative for cough, choking, shortness of breath and wheezing.   Cardiovascular: Negative for chest pain and palpitations.  Gastrointestinal: Positive for nausea and vomiting. Negative for constipation and diarrhea.  Genitourinary: Negative for decreased urine volume, difficulty urinating and dysuria.  Neurological: Negative for headaches.    Objective:   Physical Exam:  There were no vitals taken for this visit. ECOG: 0  Physical Exam  Lab Review:     Component Value Date/Time   NA 143 09/24/2019 0814   K 4.2 09/24/2019 0814   CL 106 09/24/2019 0814   CO2 28 09/24/2019 0814   GLUCOSE 156 (H) 09/24/2019 0814   BUN 10 09/24/2019 0814   CREATININE 0.72 09/24/2019 0814   CALCIUM 8.9 09/24/2019 0814   PROT 7.0 09/24/2019 0814   ALBUMIN 3.2 (L) 09/24/2019 0814   AST 28 09/24/2019 0814   ALT 30 09/24/2019 0814   ALKPHOS 57 09/24/2019 0814   BILITOT 0.4 09/24/2019 0814   GFRNONAA >60 09/24/2019 0814   GFRAA >60 09/24/2019 0814       Component Value Date/Time   WBC 8.0 09/24/2019 0814   WBC 6.0 12/02/2018 1009   RBC 4.09 09/24/2019 0814   HGB 11.9 (L) 09/24/2019 0814   HCT 37.8 09/24/2019 0814   PLT 250 09/24/2019 0814   MCV 92.4 09/24/2019 0814   MCH 29.1 09/24/2019 0814   MCHC 31.5 09/24/2019 0814   RDW 13.8 09/24/2019 0814   LYMPHSABS 1.1 09/24/2019 0814   MONOABS 0.4 09/24/2019 0814   EOSABS 0.0 09/24/2019 0814   BASOSABS 0.0 09/24/2019 0814   -------------------------------  Imaging from last 24 hours (if applicable):  Radiology interpretation: Dg Chest Port 1 View  Result Date: 09/23/2019 CLINICAL DATA:  63 year old female status post Port-A-Cath placement. EXAM: PORTABLE CHEST 1 VIEW COMPARISON:  Chest radiographs 07/15/2018 and earlier. FINDINGS: Portable AP upright view at 1415 hours. Right chest IJ approach porta cath in place, power port and currently accessed. Catheter tip is at the  lower SVC level just below the carina. No adverse features. Lower lung volumes. Mild atelectasis. No pneumothorax. Stable cardiac size and mediastinal contours. Stable left chest cardiac event recorder. Visualized tracheal air column is within normal limits. Paucity of bowel gas in the upper abdomen. Degenerative changes at both shoulders. No acute osseous abnormality identified. IMPRESSION: 1. Right chest power port placed with no adverse features. 2. Lower lung volumes with mild atelectasis. Electronically Signed   By: Genevie Ann M.D.   On: 09/23/2019 14:27   US Breast Ltd Uni Left Inc Axilla  Result Date: 08/31/2019 CLINICAL DATA:  Recall from screening mammography with tomosynthesis, possible mass or focal asymmetry associated with subtle architectural distortion involving the UPPER OUTER LEFT breast at MIDDLE to POSTERIOR depth. EXAM: DIGITAL DIAGNOSTIC LEFT MAMMOGRAM WITH TOMO ULTRASOUND LEFT BREAST COMPARISON:  Previous exam(s). ACR Breast Density Category b: There are scattered areas of fibroglandular density. FINDINGS: Tomosynthesis and synthesized spot-compression CC and MLO views of the area of concern in the LEFT breast were obtained. The focal asymmetry questioned in the  OUTER LEFT breast at POSTERIOR depth partially disperses though persists with compression. There is no visible underlying mass. There may be persistent vague architectural distortion. On correlative physical exam, there is palpable thickening in the UPPER OUTER QUADRANT of the LEFT breast. Targeted LEFT breast ultrasound is performed, showing a heterogeneous though predominantly hypoechoic mass with vague margins at the 1 o'clock position approximately 4 cm from the nipple measuring approximately 1.0 x 1.1 x 2.1 cm, demonstrating posterior acoustic shadowing and no internal power Doppler flow, associated with scattered calcifications, corresponding to the screening mammographic finding. Sonographic evaluation of the LEFT axilla  demonstrates no pathologic lymphadenopathy. IMPRESSION: 1. Suspicious approximate 2.1 cm mass involving the UPPER OUTER QUADRANT of the LEFT breast which accounts for the screening mammographic finding. 2. No pathologic LEFT axillary lymphadenopathy. RECOMMENDATION: Ultrasound-guided core needle biopsy of the LEFT breast mass. The ultrasound core needle biopsy procedure was discussed with the patient and her questions were answered. She has agreed to proceed and the biopsy has been scheduled for Wednesday, September 30. I have discussed the findings and recommendations with the patient. BI-RADS CATEGORY  4: Suspicious. Electronically Signed   By: Evangeline Dakin M.D.   On: 08/31/2019 15:43   Mm Diag Breast Tomo Uni Left  Result Date: 08/31/2019 CLINICAL DATA:  Recall from screening mammography with tomosynthesis, possible mass or focal asymmetry associated with subtle architectural distortion involving the UPPER OUTER LEFT breast at MIDDLE to POSTERIOR depth. EXAM: DIGITAL DIAGNOSTIC LEFT MAMMOGRAM WITH TOMO ULTRASOUND LEFT BREAST COMPARISON:  Previous exam(s). ACR Breast Density Category b: There are scattered areas of fibroglandular density. FINDINGS: Tomosynthesis and synthesized spot-compression CC and MLO views of the area of concern in the LEFT breast were obtained. The focal asymmetry questioned in the OUTER LEFT breast at POSTERIOR depth partially disperses though persists with compression. There is no visible underlying mass. There may be persistent vague architectural distortion. On correlative physical exam, there is palpable thickening in the UPPER OUTER QUADRANT of the LEFT breast. Targeted LEFT breast ultrasound is performed, showing a heterogeneous though predominantly hypoechoic mass with vague margins at the 1 o'clock position approximately 4 cm from the nipple measuring approximately 1.0 x 1.1 x 2.1 cm, demonstrating posterior acoustic shadowing and no internal power Doppler flow, associated  with scattered calcifications, corresponding to the screening mammographic finding. Sonographic evaluation of the LEFT axilla demonstrates no pathologic lymphadenopathy. IMPRESSION: 1. Suspicious approximate 2.1 cm mass involving the UPPER OUTER QUADRANT of the LEFT breast which accounts for the screening mammographic finding. 2. No pathologic LEFT axillary lymphadenopathy. RECOMMENDATION: Ultrasound-guided core needle biopsy of the LEFT breast mass. The ultrasound core needle biopsy procedure was discussed with the patient and her questions were answered. She has agreed to proceed and the biopsy has been scheduled for Wednesday, September 30. I have discussed the findings and recommendations with the patient. BI-RADS CATEGORY  4: Suspicious. Electronically Signed   By: Evangeline Dakin M.D.   On: 08/31/2019 15:43   Mm Clip Placement Left  Result Date: 09/02/2019 CLINICAL DATA:  Status post ultrasound-guided core needle biopsy of a 2.1 cm mass in the 1 o'clock position of the left breast. EXAM: DIAGNOSTIC LEFT MAMMOGRAM POST ULTRASOUND BIOPSY COMPARISON:  Previous exam(s). FINDINGS: Mammographic images were obtained following ultrasound guided biopsy of the recently demonstrated 2.1 cm mass in the 1 o'clock position of the left breast. These demonstrate a ribbon shaped biopsy marker clip in the biopsied mass. IMPRESSION: Appropriate clip deployment following left breast ultrasound-guided core needle biopsy.  Final Assessment: Post Procedure Mammograms for Marker Placement Electronically Signed   By: Claudie Revering M.D.   On: 09/02/2019 11:41   [place] Lt Breast Bx W Loc Dev 1st Lesion Img Bx Spec US Guide  Addendum Date: 09/03/2019   ADDENDUM REPORT: 09/03/2019 13:38 ADDENDUM: Pathology revealed GRADE III INVASIVE DUCTAL CARCINOMA, DUCTAL CARCINOMA IN SITU of Left breast, 1 o'clock. This was found to be concordant by Dr. Claudie Revering. Pathology results were discussed with the patient by telephone. The patient  reported doing well after the biopsy with tenderness at the site. Post biopsy instructions and care were reviewed and questions were answered. The patient was encouraged to call [facility] for any additional concerns. The patient was referred to [facility] on September 09, 2019. Pathology results reported by Terie Purser, RN on 09/03/2019. Electronically Signed   By: Claudie Revering M.D.   On: 09/03/2019 13:38   Result Date: 09/03/2019 CLINICAL DATA:  2.1 cm mass in the 1 o'clock position of the left breast with recent imaging findings suspicious for malignancy. EXAM: ULTRASOUND GUIDED LEFT BREAST CORE NEEDLE BIOPSY COMPARISON:  Previous exam(s). FINDINGS: I met with the patient and we discussed the procedure of ultrasound-guided biopsy, including benefits and alternatives. We discussed the high likelihood of a successful procedure. We discussed the risks of the procedure, including infection, bleeding, tissue injury, clip migration, and inadequate sampling. Informed written consent was given. The usual time-out protocol was performed immediately prior to the procedure. Lesion quadrant: Upper outer quadrant Using sterile technique and 1% Lidocaine as local anesthetic, under direct ultrasound visualization, a 12 gauge spring-loaded device was used to perform biopsy of the recently demonstrated 2.1 cm mass in the 1 o'clock position of the left breast, 4 cm from the nipple, using a lateral approach. At the conclusion of the procedure ribbon shaped tissue marker clip was deployed into the biopsy cavity. Follow up 2 view mammogram was performed and dictated separately. IMPRESSION: Ultrasound guided biopsy of the recently demonstrated 2.1 cm mass in the 1 o'clock position of the left breast. No apparent complications. Electronically Signed: By: Claudie Revering M.D. On: 09/02/2019 11:18

## 2019-09-28 NOTE — Telephone Encounter (Signed)
Called patient regarding [facility]/lab appt today.  Spoke with pt's husband Audry Pili who states that pt's N/V is under control and that she is able to tolerated food/fluids now.  Denies any diarrhea or fever/chills, reports only continued mild abd pain managed with medication.  Denies needing evaluation by [facility] or anything else at this time.  Appts for [place] for today cancelled, telephone note routed to MD [place] desk RN.

## 2019-09-29 ENCOUNTER — Other Ambulatory Visit: Payer: Self-pay | Admitting: Pharmacy Technician

## 2019-09-29 ENCOUNTER — Other Ambulatory Visit: Payer: Self-pay | Admitting: Pharmacist

## 2019-09-29 NOTE — Patient Outreach (Signed)
[facility]) Care Management  [place]   09/29/2019  Shelby Anderson September 25, 1956 [id number]  Patient has been approved for Symbicort and Ventolin patient assistance program(s).  Patient has been instructed on how to order refills and renewal process for 2021.  Plan: -Goals of care have been met. -I have provided my contact information if patient or family needs to reach out to me in the future.  -Thank you for allowing [facility] pharmacy to be involved in this patient's care.    Ralene Bathe, PharmD, [place] [phone]

## 2019-09-29 NOTE — Patient Outreach (Signed)
[facility]) Care Management  09/29/2019  Shelby Anderson 30-Jun-1956 [id number]    Successful call placed to patients husband regarding patient assistance medication delivery of Symbicort and Ventolin HFA, HIPAA identifiers verified. [name] states that patient is currently asleep but able to confirm that patient received medications in the mail from companies. Informed him that she is approved thru [employer] and [employer] until 12/03/19. Requested that they contact me with any additional questions they may have. He states that he will check with her when she wakes up and call if needed.  Follow up:  Will route note to [place] for case closure  Maud Deed. Chana Bode [place] Certified Pharmacy Technician [place] Management Direct [phone]

## 2019-09-30 ENCOUNTER — Other Ambulatory Visit: Payer: Self-pay

## 2019-09-30 ENCOUNTER — Telehealth: Payer: Self-pay

## 2019-09-30 ENCOUNTER — Encounter: Payer: Self-pay | Admitting: General Practice

## 2019-09-30 ENCOUNTER — Inpatient Hospital Stay (HOSPITAL_COMMUNITY)
Admission: EM | Admit: 2019-09-30 | Discharge: 2019-10-04 | DRG: 291 | Disposition: A | Discharge status: Home / Self Care | Payer: Medicare Other | Attending: Internal Medicine | Admitting: Internal Medicine

## 2019-09-30 ENCOUNTER — Emergency Department (HOSPITAL_COMMUNITY): Payer: Medicare Other

## 2019-09-30 DIAGNOSIS — Z807 Family history of other malignant neoplasms of lymphoid, hematopoietic and related tissues: Secondary | ICD-10-CM

## 2019-09-30 DIAGNOSIS — K219 Gastro-esophageal reflux disease without esophagitis: Secondary | ICD-10-CM | POA: Diagnosis not present

## 2019-09-30 DIAGNOSIS — R0902 Hypoxemia: Secondary | ICD-10-CM

## 2019-09-30 DIAGNOSIS — J45901 Unspecified asthma with (acute) exacerbation: Secondary | ICD-10-CM | POA: Diagnosis present

## 2019-09-30 DIAGNOSIS — E1169 Type 2 diabetes mellitus with other specified complication: Secondary | ICD-10-CM | POA: Diagnosis not present

## 2019-09-30 DIAGNOSIS — D709 Neutropenia, unspecified: Secondary | ICD-10-CM | POA: Diagnosis not present

## 2019-09-30 DIAGNOSIS — J810 Acute pulmonary edema: Secondary | ICD-10-CM

## 2019-09-30 DIAGNOSIS — Z171 Estrogen receptor negative status [ER-]: Secondary | ICD-10-CM

## 2019-09-30 DIAGNOSIS — I110 Hypertensive heart disease with heart failure: Secondary | ICD-10-CM | POA: Diagnosis not present

## 2019-09-30 DIAGNOSIS — R0602 Shortness of breath: Secondary | ICD-10-CM

## 2019-09-30 DIAGNOSIS — D61818 Other pancytopenia: Secondary | ICD-10-CM | POA: Diagnosis not present

## 2019-09-30 DIAGNOSIS — E119 Type 2 diabetes mellitus without complications: Secondary | ICD-10-CM

## 2019-09-30 DIAGNOSIS — Z886 Allergy status to analgesic agent status: Secondary | ICD-10-CM

## 2019-09-30 DIAGNOSIS — C50411 Malignant neoplasm of upper-outer quadrant of right female breast: Secondary | ICD-10-CM | POA: Diagnosis present

## 2019-09-30 DIAGNOSIS — Z8249 Family history of ischemic heart disease and other diseases of the circulatory system: Secondary | ICD-10-CM

## 2019-09-30 DIAGNOSIS — R509 Fever, unspecified: Secondary | ICD-10-CM | POA: Diagnosis not present

## 2019-09-30 DIAGNOSIS — Z800 Family history of malignant neoplasm of digestive organs: Secondary | ICD-10-CM

## 2019-09-30 DIAGNOSIS — I252 Old myocardial infarction: Secondary | ICD-10-CM

## 2019-09-30 DIAGNOSIS — E785 Hyperlipidemia, unspecified: Secondary | ICD-10-CM | POA: Diagnosis not present

## 2019-09-30 DIAGNOSIS — R5081 Fever presenting with conditions classified elsewhere: Secondary | ICD-10-CM | POA: Diagnosis present

## 2019-09-30 DIAGNOSIS — Z7982 Long term (current) use of aspirin: Secondary | ICD-10-CM

## 2019-09-30 DIAGNOSIS — I5033 Acute on chronic diastolic (congestive) heart failure: Secondary | ICD-10-CM | POA: Diagnosis present

## 2019-09-30 DIAGNOSIS — R21 Rash and other nonspecific skin eruption: Secondary | ICD-10-CM | POA: Diagnosis present

## 2019-09-30 DIAGNOSIS — Z6836 Body mass index (BMI) 36.0-36.9, adult: Secondary | ICD-10-CM

## 2019-09-30 DIAGNOSIS — Z96652 Presence of left artificial knee joint: Secondary | ICD-10-CM | POA: Diagnosis present

## 2019-09-30 DIAGNOSIS — J9601 Acute respiratory failure with hypoxia: Secondary | ICD-10-CM | POA: Diagnosis present

## 2019-09-30 DIAGNOSIS — Z9221 Personal history of antineoplastic chemotherapy: Secondary | ICD-10-CM

## 2019-09-30 DIAGNOSIS — Z79891 Long term (current) use of opiate analgesic: Secondary | ICD-10-CM

## 2019-09-30 DIAGNOSIS — I2510 Atherosclerotic heart disease of native coronary artery without angina pectoris: Secondary | ICD-10-CM | POA: Diagnosis not present

## 2019-09-30 DIAGNOSIS — Z85828 Personal history of other malignant neoplasm of skin: Secondary | ICD-10-CM

## 2019-09-30 DIAGNOSIS — I10 Essential (primary) hypertension: Secondary | ICD-10-CM | POA: Diagnosis present

## 2019-09-30 DIAGNOSIS — Z833 Family history of diabetes mellitus: Secondary | ICD-10-CM

## 2019-09-30 DIAGNOSIS — I4891 Unspecified atrial fibrillation: Secondary | ICD-10-CM | POA: Diagnosis not present

## 2019-09-30 DIAGNOSIS — G4733 Obstructive sleep apnea (adult) (pediatric): Secondary | ICD-10-CM | POA: Diagnosis not present

## 2019-09-30 DIAGNOSIS — Z86010 Personal history of colonic polyps: Secondary | ICD-10-CM

## 2019-09-30 DIAGNOSIS — E876 Hypokalemia: Secondary | ICD-10-CM | POA: Diagnosis not present

## 2019-09-30 DIAGNOSIS — Z880 Allergy status to penicillin: Secondary | ICD-10-CM | POA: Diagnosis not present

## 2019-09-30 DIAGNOSIS — Z8711 Personal history of peptic ulcer disease: Secondary | ICD-10-CM | POA: Diagnosis not present

## 2019-09-30 DIAGNOSIS — E1159 Type 2 diabetes mellitus with other circulatory complications: Secondary | ICD-10-CM | POA: Diagnosis not present

## 2019-09-30 DIAGNOSIS — Z885 Allergy status to narcotic agent status: Secondary | ICD-10-CM

## 2019-09-30 DIAGNOSIS — Z808 Family history of malignant neoplasm of other organs or systems: Secondary | ICD-10-CM

## 2019-09-30 DIAGNOSIS — Z888 Allergy status to other drugs, medicaments and biological substances status: Secondary | ICD-10-CM

## 2019-09-30 DIAGNOSIS — I152 Hypertension secondary to endocrine disorders: Secondary | ICD-10-CM | POA: Diagnosis present

## 2019-09-30 DIAGNOSIS — Z20828 Contact with and (suspected) exposure to other viral communicable diseases: Secondary | ICD-10-CM | POA: Diagnosis present

## 2019-09-30 DIAGNOSIS — Z7951 Long term (current) use of inhaled steroids: Secondary | ICD-10-CM

## 2019-09-30 DIAGNOSIS — Z9981 Dependence on supplemental oxygen: Secondary | ICD-10-CM

## 2019-09-30 DIAGNOSIS — Z7984 Long term (current) use of oral hypoglycemic drugs: Secondary | ICD-10-CM

## 2019-09-30 DIAGNOSIS — C50919 Malignant neoplasm of unspecified site of unspecified female breast: Secondary | ICD-10-CM | POA: Diagnosis not present

## 2019-09-30 DIAGNOSIS — C50412 Malignant neoplasm of upper-outer quadrant of left female breast: Secondary | ICD-10-CM

## 2019-09-30 DIAGNOSIS — Z79899 Other long term (current) drug therapy: Secondary | ICD-10-CM

## 2019-09-30 MED ORDER — ALBUTEROL SULFATE HFA 108 (90 BASE) MCG/ACT IN AERS
8.0000 | INHALATION_SPRAY | Freq: Once | RESPIRATORY_TRACT | Status: AC
  Administered 2019-10-01: 8 via RESPIRATORY_TRACT
  Filled 2019-09-30: qty 6.7

## 2019-09-30 MED ORDER — ALBUTEROL SULFATE HFA 108 (90 BASE) MCG/ACT IN AERS: 2.0000 | INHALATION_SPRAY | Freq: Once | RESPIRATORY_TRACT | Status: DC

## 2019-09-30 NOTE — Telephone Encounter (Signed)
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  Patient receiving neoadjuvant chemotherapy.  Past medical history of HLD, DM, cardiomyopathy, HTN, GERD, CAD.  Spoke with patient via phone this am to introduce self and service at [facility].  Noted per chart problems with nausea after first treatment.  Spoke with patient this am and reports some nausea this am and has taken zofran.  Has not eaten anything.  Reports problems with diarrhea.  Reports 12 diarrhea episodes yesterday and 3 so far today.  She is not taking anything for diarrhea.  Appetite has not been good, reports ate taco last night.    Medications:  Nexium, glipizide, ativan, metformin, MVI, zofran, compazine, KCL, carafate, Vit B 12  Labs: reviewed  Anthropometrics:   Height: 66 inches Weight: 231 (10/22) BMI: 37  Stable weight   NUTRITION DIAGNOSIS: Inadequate oral intake related to cancer related treatment side effects as evidenced by diarrhea and issues with nausea  INTERVENTION:  Discussed foods to choose with nausea, vomiting and diarrhea.  Will email handouts to patient.   Left message for Dr Geralyn Flash nurse regarding patient's reported symptoms. Patient has appointment to see Dr. Lindi Adie tomorrow 10/29   Patient confirmed that she received packet of information regarding nutritional tips for breast cancer patients at breast clinic. Patient has contact information   MONITORING, EVALUATION, and GOAL: Pt will consume adequate calories and protein to prevent loss of lean muscle mass during treatment  Next visit: Thursday, Dec 3 during infusion  Adalay Azucena B. Zenia Resides, [place], [place] Registered Dietitian [phone] (pager)

## 2019-09-30 NOTE — ED Provider Notes (Addendum)
[facility] DEPT Provider Note   CSN: [id number] Arrival date & time: 09/30/19  2253     History   Chief Complaint Chief Complaint  Patient presents with  . Fever  . Rash    HPI BRIANNE MAINA is a 63 y.o. female.     The history is provided by the patient and medical records.  Fever Associated symptoms: rash   Rash Associated symptoms: fever      63 y.o. F with hx of seasonal allergies, anemia, anxiety, AFIB not on anticoagulation, CAD, HLP, HTN, DM2, TMJ, presenting to the ED for fever.  Patient with left sided breast cancer, just started chemotherapy on 09/24/2019 with Taxotere/Cytoxan.  She is also receiving Fulphila (neulasta similar).  States for the past few days she has been having some vomiting/diarrhea but has been able to control this with zofran and imodium, able to eat in small amounts now.  Actually was feeling better today.  She was watching movie with grandchild and noticed she was having some cold chills.  T-max at home 102.55F.  States she did notice some shortness of breath but attributed this to her asthma.  She is on proair as well as Symbicort for this as well as as needed albuterol nebs.  She does report a dry cough but denies any production of sputum or hemoptysis.  She is not having any chest pain.  She was hypoxic on room air to 86%, bit once O2 was applied with EMS she started feeling better.  .  Generally does not require O2 at home.  Her only other complaint is rash that developed after her first chemo infusion.  She was told this was a normal side effect.  She denies any significant itching, weeping, or bleeding from the wound.  Past Medical History:  Diagnosis Date  . Allergy   . Anemia    "chronic"  . Angina   . Anxiety and depression   . Arthritis   . Asthma   . Atrial fibrillation ([place])    h/o "AF w/frequent PVCs"  . Breast cancer ([place])    Left  . Cancer (HCC)    hx of skin cancer   . CHF (congestive  heart failure) ([place])   . Chronic back pain greater than 3 months duration    on chronic narcotics, treated at pain clinic  . Colon polyps    hyperplastic  . Coronary artery disease    Arrythmia, orthostatic hypotension, HLD, HTN; sees Dr. Einar Gip  . Difficult intubation    "TMJ & woke up when they were still cutting on me"  . Dysrhythmia    sees Dr. Einar Gip and a cardiologist at [facility]  . Esophageal stricture   . Family history of melanoma ]  . Family history of pancreatic cancer   . Fatty liver   . Fibroids   . Fibromyalgia    "in my legs"  . GERD (gastroesophageal reflux disease)    hx hiatal hernia, stricture and gastric ulcer  . Headache(784.0)   . Heart murmur   . Hiatal hernia   . History of loop recorder   . History of migraines    "dx'd when I was in my teens"  . Hyperlipemia   . Hypertension   . Mental disorder   . Mild episode of recurrent major depressive disorder ([place]) 12/06/2015  . Myocardial infarction [facility]) 1980's & 1990;   sees Dr. Einar Gip  . OSA (obstructive sleep apnea)   . Pneumonia   .  PONV (postoperative nausea and vomiting)   . Recurrent upper respiratory infection (URI)   . Shortness of breath 11/20/11   "all the time", sees pulmonlogy, ? asthma  . Stenotic cervical os   . Stomach ulcer    "3 small; found in 05/2011"  . TMJ (dislocation of temporomandibular joint)   . Tuberculosis    + TB SKIN TEST  . Type 2 diabetes mellitus without complication, without long-term current use of insulin ([place]) 12/06/2015   not on meds     Patient Active Problem List   Diagnosis Date Noted  . Genetic testing 09/17/2019  . Family history of melanoma   . Family history of pancreatic cancer   . Malignant neoplasm of upper-outer quadrant of right breast in female, estrogen receptor negative ([place]) 09/07/2019  . Encounter for loop recorder at end of battery life 04/24/2018  . Esophageal stricture 07/01/2017  . Hyperlipidemia associated with type 2 diabetes mellitus  ([place]) 05/20/2017  . Allergic rhinitis 03/11/2017  . Dilated cardiomyopathy ([place]) 02/07/2017  . 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 Morbid obesity due to excess calories ([place]) 09/26/2016  . S/P left TKA 09/25/2016  . Increased endometrial stripe thickness 06/27/2016  . Intramural leiomyoma of uterus 06/27/2016  . Type 2 diabetes mellitus without complication, without long-term current use of insulin ([place]) 12/06/2015  . Chronic respiratory failure ([place]) 09/15/2015  . Dyspnea 09/01/2015  . Hypertension associated with diabetes ([place]) 07/19/2015  . Arrhythmia 07/19/2015  . Orthostatic hypotension 07/19/2015  . GERD (gastroesophageal reflux disease) 07/19/2015  . Chronic back pain 07/19/2015  . Neuropathy 07/19/2015  . Symptomatic PVCs 11/02/2014  . Syncope 11/02/2014  . Chest pain 12/23/2013  . Disorder of cervix 03/10/2013  . Vaginal atrophy 03/10/2013  . OSA (obstructive sleep apnea) 07/31/2012  . Coronary artery disease 11/20/2011    Past Surgical History:  Procedure Laterality Date  . ACHILLES TENDON REPAIR  1970's   left ankle  . ARTHROSCOPIC REPAIR ACL     left knee cap  . BREAST BIOPSY Right 04/08/2013  . CARDIAC CATHETERIZATION     loop recorder  . CARPAL TUNNEL RELEASE  unknown   left hand  . ESOPHAGOGASTRODUODENOSCOPY (EGD) WITH PROPOFOL N/A 10/29/2017   Procedure: ESOPHAGOGASTRODUODENOSCOPY (EGD) WITH PROPOFOL;  Surgeon: Irene Shipper, MD;  Location: [facility];  Service: Endoscopy;  Laterality: N/A;  . LOOP RECORDER IMPLANT    . PORTACATH PLACEMENT N/A 09/23/2019   Procedure: INSERTION PORT-A-CATH Right Internal Doreen Salvage;  Surgeon: Rolm Bookbinder, MD;  Location: [facility];  Service: General;  Laterality: N/A;  . post ganglionectomy  1970's   "for migraine headaches"  . pouch string  [phone]   "did this 3 times (once w/each pregnancy)"  . SAVORY DILATION N/A 10/29/2017   Procedure: SAVORY DILATION;  Surgeon: Irene Shipper,  MD;  Location: [facility];  Service: Endoscopy;  Laterality: N/A;  . TOTAL KNEE ARTHROPLASTY Left 09/25/2016   Procedure: LEFT TOTAL KNEE ARTHROPLASTY;  Surgeon: Paralee Cancel, MD;  Location: [facility];  Service: Orthopedics;  Laterality: Left;  . TUBAL LIGATION  1980's     OB History    Gravida  5   Para  3   Term      Preterm      AB  2   Living  3     SAB  2   TAB      Ectopic      Multiple      Live Births  Home Medications    Prior to Admission medications   Medication Sig Start Date End Date Taking? Authorizing Provider  albuterol (PROVENTIL) (2.5 MG/3ML) 0.083% nebulizer solution Take 3 mLs (2.5 mg total) by nebulization every 6 (six) hours as needed for wheezing or shortness of breath (J45.40). 09/19/16   Javier Glazier, MD  albuterol (VENTOLIN HFA) 108 (90 Base) MCG/ACT inhaler Inhale 2 puffs into the lungs every 6 (six) hours as needed for wheezing or shortness of breath. 09/07/19   Rigoberto Noel, MD  aspirin EC 81 MG tablet Take 81 mg at bedtime by mouth.     [provider]  atorvastatin (LIPITOR) 40 MG tablet TAKE 1 TABLET BY MOUTH EVERY DAY 09/16/19   Colin Benton R, DO  budesonide-formoterol (SYMBICORT) 80-4.5 MCG/ACT inhaler INHALE 2 PUFFS INTO THE LUNGS EVERY MORNING AND ANOTHER 2 PUFFS 12 HOURS LATER Patient taking differently: Inhale 2 puffs into the lungs 2 (two) times daily.  12/16/18   Martyn Ehrich, NP  CINNAMON PO Take 1,000 mg 2 (two) times daily by mouth.    [provider]  doxepin (SINEQUAN) 10 MG capsule Take 20 mg by mouth at bedtime.  03/27/19   [provider]  esomeprazole (NEXIUM) 40 MG capsule Take 1 capsule (40 mg total) by mouth 2 (two) times daily. 08/19/19   Irene Shipper, MD  fluticasone [facility]) 50 MCG/ACT nasal spray Place 2 sprays at bedtime as needed into both nostrils for allergies or rhinitis.     [provider]  glipiZIDE (GLUCOTROL XL) 5 MG 24 hr tablet TAKE 1 TABLET  BY MOUTH EVERY DAY WITH BREAKFAST Patient taking differently: Take 5 mg by mouth daily with breakfast.  06/22/19   Lucretia Kern, DO  HYDROcodone-acetaminophen (NORCO) 10-325 MG tablet Take 1 tablet by mouth every 6 (six) hours as needed. 09/23/19 09/22/20  Rolm Bookbinder, MD  isosorbide mononitrate (IMDUR) 60 MG 24 hr tablet TAKE 1 TABLET BY MOUTH EVERY DAY Patient taking differently: Take 60 mg by mouth daily.  09/11/19   Lucretia Kern, DO  L-Methylfolate (DEPLIN) 7.5 MG TABS Take 7.5 mg by mouth daily with breakfast.     [provider]  lidocaine-prilocaine (EMLA) cream Apply to affected area once Patient taking differently: Apply 1 application topically daily as needed (numbing).  09/10/19   Nicholas Lose, MD  LORazepam (ATIVAN) 0.5 MG tablet Take 1 tablet (0.5 mg total) by mouth at bedtime as needed for sleep. 09/10/19   Nicholas Lose, MD  LYRICA 150 MG capsule Take 150 mg by mouth 3 (three) times daily.  05/28/18   [provider]  metFORMIN (GLUCOPHAGE) 500 MG tablet TAKE 1 TABLET (500 MG TOTAL) BY MOUTH 2 (TWO) TIMES DAILY WITH A MEAL. 09/28/19   Caren Macadam, MD  metoprolol tartrate (LOPRESSOR) 50 MG tablet TAKE 1 TABLET BY MOUTH TWICE A DAY Patient taking differently: Take 50 mg by mouth 2 (two) times daily.  09/11/19   Lucretia Kern, DO  Misc Natural Products (GLUCOS-CHONDROIT-MSM COMPLEX PO) Take 1 tablet 3 (three) times daily by mouth.     [provider]  mometasone (ELOCON) 0.1 % cream Apply 1 application topically daily as needed (irritation).  05/17/19   [provider]  montelukast (SINGULAIR) 10 MG tablet TAKE 1 TABLET BY MOUTH EVERYDAY AT BEDTIME Patient taking differently: Take 10 mg by mouth at bedtime.  08/13/19   Rigoberto Noel, MD  Multiple Vitamin (MULITIVITAMIN WITH MINERALS) TABS Take 1  tablet by mouth daily.      [provider]  nitroGLYCERIN (NITROSTAT) 0.4 MG SL tablet Place 0.4 mg under the tongue every 5 (five)  minutes as needed for chest pain.     [provider]  ondansetron (ZOFRAN) 4 MG tablet Take 1 tablet (4 mg total) by mouth every 4 (four) hours as needed for nausea or vomiting. 08/19/19   Irene Shipper, MD  ondansetron (ZOFRAN) 8 MG tablet Take 1 tablet (8 mg total) by mouth 2 (two) times daily as needed for refractory nausea / vomiting. Start on day 3 after chemo. 09/10/19   Nicholas Lose, MD  Oxycodone HCl 20 MG TABS Take 20 mg every 4 (four) hours as needed by mouth (pain).  08/09/17   [provider]  OXYGEN Inhale 2 L into the lungs at bedtime as needed (shortness of breath).     [provider]  potassium chloride (K-DUR) 10 MEQ tablet TAKE 2 TABLETS BY MOUTH EVERY DAY Patient taking differently: Take 20 mEq by mouth daily.  07/23/19   Lucretia Kern, DO  PROAIR HFA 108 [phone] Base) MCG/ACT inhaler INHALE 2 PUFFS INTO THE LUNGS EVERY 4 HOURS AS NEEDED FOR SHORTNESS OF BREATH 03/23/19   Rigoberto Noel, MD  prochlorperazine (COMPAZINE) 10 MG tablet Take 1 tablet (10 mg total) by mouth every 6 (six) hours as needed (Nausea or vomiting). 09/10/19   Nicholas Lose, MD  promethazine (PHENERGAN) 25 MG tablet Take 1 tablet (25 mg total) by mouth every 6 (six) hours as needed for nausea or vomiting. 08/19/19   Irene Shipper, MD  sucralfate (CARAFATE) 1 g tablet Take 1 tablet (1 g total) by mouth 4 (four) times daily -  with meals and at bedtime. Before taking slowly dissolve tablet in 1 Tablespoon of distilled water for about 15 minutes to creat a slurry. 08/19/19   Irene Shipper, MD  tiZANidine (ZANAFLEX) 4 MG tablet Take 4 mg by mouth every 8 (eight) hours as needed for muscle spasms.    [provider]  vitamin B-12 (CYANOCOBALAMIN) 500 MCG tablet Take 500 mcg by mouth daily.     [provider]    Family History Family History  Problem Relation Age of Onset  . Malignant hyperthermia Father   . Hypertension Father   . Heart disease Father   . Diabetes Father   .  Cancer Father        skin  . Hypertension Mother   . Heart disease Mother   . Multiple myeloma Mother   . Cancer Sister        CERVICAL  . Hypertension Sister   . Cancer Brother 22       MELANOMA  . Heart disease Maternal Grandmother   . Other Maternal Grandmother 32       complications of childbirth  . Heart disease Maternal Grandfather   . Cancer Paternal Grandmother        ?   Marland Kitchen Heart disease Paternal Grandmother   . Heart disease Paternal Grandfather   . Cancer Brother        LUNG  . Diabetes Sister   . Hypertension Sister   . Heart disease Sister   . Cancer Sister   . Cancer Brother   . Pancreatic cancer Niece 37  . Cancer Nephew 40       unknown- currently in the [employer]  . Anesthesia problems Neg Hx   . Hypotension Neg Hx   .  Pseudochol deficiency Neg Hx   . Colon cancer Neg Hx   . Esophageal cancer Neg Hx   . Rectal cancer Neg Hx   . Stomach cancer Neg Hx   . Breast cancer Neg Hx     Social History Social History   Tobacco Use  . Smoking status: Never Smoker  . Smokeless tobacco: Never Used  Substance Use Topics  . Alcohol use: No    Alcohol/week: 0.0 standard drinks  . Drug use: No     Allergies   Lodine [etodolac], Oxycontin [oxycodone hcl], Penicillins, Aspirin, Darvocet [propoxyphene n-acetaminophen], Nitroglycerin, Tramadol, Ultram [tramadol hcl], and Valium   Review of Systems Review of Systems  Constitutional: Positive for fever.  Skin: Positive for rash.  All other systems reviewed and are negative.    Physical Exam Updated Vital Signs BP 133/84 (BP Location: Left Arm)   Pulse 91   Temp (!) 100.5 F (38.1 C) (Oral) Comment: 1000 mg tylenol at 21:30  Resp 20   Ht '5\' 6"'  (1.676 m)   Wt 102.1 kg   SpO2 97%   BMI 36.32 kg/m   Physical Exam Vitals signs and nursing note reviewed.  Constitutional:      Appearance: She is well-developed.  HENT:     Head: Normocephalic and atraumatic.  Eyes:     Conjunctiva/sclera:  Conjunctivae normal.     Pupils: Pupils are equal, round, and reactive to light.  Neck:     Musculoskeletal: Normal range of motion.  Cardiovascular:     Rate and Rhythm: Normal rate and regular rhythm.     Heart sounds: Normal heart sounds.  Pulmonary:     Effort: Pulmonary effort is normal.     Breath sounds: Normal breath sounds.  Chest:     Comments: Port right chest wall, appears clean Abdominal:     General: Bowel sounds are normal.     Palpations: Abdomen is soft.  Musculoskeletal: Normal range of motion.  Skin:    General: Skin is warm and dry.     Comments: Rash noted on bilateral breasts, somewhat beneath the breasts as well and around the neck, no signs of superimposed infection or cellulitic changes  Neurological:     Mental Status: She is alert and oriented to person, place, and time.      ED Treatments / Results  Labs (all labs ordered are listed, but only abnormal results are displayed) Labs Reviewed  CBC WITH DIFFERENTIAL/PLATELET - Abnormal; Notable for the following components:      Result Value   WBC 3.4 (*)    nRBC 1.2 (*)    Neutro Abs 1.2 (*)    Abs Immature Granulocytes 0.36 (*)    All other components within normal limits  COMPREHENSIVE METABOLIC PANEL - Abnormal; Notable for the following components:   Potassium 3.0 (*)    Glucose, Bld 108 (*)    BUN 7 (*)    Albumin 3.2 (*)    All other components within normal limits  CULTURE, BLOOD (ROUTINE X 2)  SARS CORONAVIRUS 2 BY RT PCR (HOSPITAL ORDER, [facility])  CULTURE, BLOOD (ROUTINE X 2)  URINE CULTURE  LACTIC ACID, PLASMA  URINALYSIS, ROUTINE W REFLEX MICROSCOPIC  BRAIN NATRIURETIC PEPTIDE  HIV ANTIBODY (ROUTINE TESTING W REFLEX)    EKG None  Radiology Dg Chest Port 1 View  Result Date: 10/01/2019 CLINICAL DATA:  Shortness of breath and fever. On chemotherapy. EXAM: PORTABLE CHEST 1 VIEW COMPARISON:  Chest radiograph  09/23/2019 FINDINGS: There is a right  chest wall power-injectable Port-A-Cath with tip at the cavoatrial junction via a right internal jugular vein approach. The heart size and mediastinal contours are within normal limits. There is shallow lung inflation without focal consolidation. The visualized skeletal structures are unremarkable. IMPRESSION: No active cardiopulmonary disease. Electronically Signed   By: Ulyses Jarred M.D.   On: 10/01/2019 00:01    Procedures Procedures (including critical care time)  CRITICAL CARE Performed by: Larene Pickett   Total critical care time: 40 minutes  Critical care time was exclusive of separately billable procedures and treating other patients.  Critical care was necessary to treat or prevent imminent or life-threatening deterioration.  Critical care was time spent personally by me on the following activities: development of treatment plan with patient and/or surrogate as well as nursing, discussions with consultants, evaluation of patient's response to treatment, examination of patient, obtaining history from patient or surrogate, ordering and performing treatments and interventions, ordering and review of laboratory studies, ordering and review of radiographic studies, pulse oximetry and re-evaluation of patient's condition.    Medications Ordered in ED Medications  sodium chloride flush (NS) 0.9 % injection 3 mL (has no administration in time range)  sodium chloride flush (NS) 0.9 % injection 3 mL (has no administration in time range)  0.9 %  sodium chloride infusion (has no administration in time range)  acetaminophen (TYLENOL) tablet 650 mg (has no administration in time range)  ondansetron (ZOFRAN) injection 4 mg (has no administration in time range)  enoxaparin (LOVENOX) injection 40 mg (has no administration in time range)  furosemide (LASIX) injection 40 mg (has no administration in time range)  potassium chloride SA (KLOR-CON) CR tablet 40 mEq (has no administration in time  range)  albuterol (VENTOLIN HFA) 108 (90 Base) MCG/ACT inhaler 8 puff (8 puffs Inhalation Given 10/01/19 0057)  albuterol (PROVENTIL) (2.5 MG/3ML) 0.083% nebulizer solution 5 mg (5 mg Nebulization Given 10/01/19 0354)  iohexol (OMNIPAQUE) 350 MG/ML injection 100 mL (100 mLs Intravenous Contrast Given 10/01/19 0327)  sodium chloride (PF) 0.9 % injection (  Given by Other 10/01/19 0401)  furosemide (LASIX) injection 40 mg (40 mg Intravenous Given 10/01/19 0430)     Initial Impression / Assessment and Plan / ED Course  I have reviewed the triage vital signs and the nursing notes.  Pertinent labs & imaging results that were available during my care of the patient were reviewed by me and considered in my medical decision making (see chart for details).  63 year old female with left-sided breast cancer newly started on chemotherapy 09/24/2019, presenting to the ED with new fever.  States T-max at home 102.26F.  She denies any upper respiratory or urinary symptoms.  No sick contacts or known Covid exposures.  Has had some vomiting and diarrhea but that has been controlled with home medications.  This was attributed to her new chemotherapy.  She has a low-grade fever on arrival here but is overall nontoxic in appearance.  She does have some diffuse expiratory wheezes, has history of fairly significant asthma, follows with pulmonology.  She was initially hypoxic down to 86%, currently saturating well on 3 L.  Does generally not require home O2.  Given chemotherapy, will obtain basic labs including blood and urine cultures, Covid screen, portable chest x-ray.  She was given albuterol inhaler here.  Labs overall reassuring, white blood cell count 3.4, mild left shift.  Normal lactate at 1.9.  Chest x-ray is clear.  UA without  any signs of infection.  Covid screen is still pending.  2:59 AM Patient reassessed--states she is feeling better after albuterol.  No longer feels like she is wheezing.  We reviewed her  labs and imaging studies are overall reassuring.  Covid test has come back and was negative.  As patient has been requiring 3 L oxygen, this was turned off for a trial of room air, however quickly desaturated down to 89%.  She denies feeling short of breath with this but does admit to feeling lightheaded.  Given her history of cancer, PE is a consideration.  Will obtain CTA of the chest.  4:09 AM CTA negative for PE, but does have pulmonary edema and a small right pleural effusion.  Patient still requiring supplemental O2.  She does report recently being diagnosed with CHF, not currently on lasix.  At this point, given ongoing O2 requirement she will require admission.  IV lasix ordered.  Discussed with Dr. Alcario Drought-- will admit for ongoing care.  Recommend to hold on abx at this time given no clear source of infection, follow blood cultures.  Final Clinical Impressions(s) / ED Diagnoses   Final diagnoses:  Fever, unspecified fever cause  Hypoxia  Acute pulmonary edema [facility])    ED Discharge Orders    None       Larene Pickett, PA-C 10/01/19 [place], Delice Bison, DO 10/01/19 [place], [place], PA-C 10/01/19 [place], Delice Bison, DO 10/01/19 [phone]

## 2019-09-30 NOTE — ED Triage Notes (Signed)
Pt presents to ED via [facility] coming from home pt c/o fever and a circumferential rash of her upper chest area x1 week that started after she began her cancer treatment last week as well.

## 2019-09-30 NOTE — Progress Notes (Signed)
[facility] Spiritual Care Note  Phoned Shelby Anderson as planned to check on her after first treatment. She was very pleased to be remembered and to know [facility] staff are available for support/encouragement as needed. Per pt, her grandchildren are doting on her and keeping her uplifted--and she also has space to be down or express other feelings when needed. Per pt, she is communicating regularly with her team re side effects, too. Encouraged her to contact chaplain anytime for further conversation.   [place], [place], [facility] Pager [phone] Voicemail [phone]

## 2019-09-30 NOTE — Telephone Encounter (Signed)
RN spoke with patient to follow up with reports of diarrhea on 10/27 and 10/28.    Pt reports 11-12 episodes of loose stool yesterday, and reports 3 today.  Pt is tolerating fluids well, reports she is making sure to replenish since she is having the diarrhea.  Pt is not using anything for diarrhea.    RN educated patient on use of OTC Imodium, and continuing with fluids.  Pt has follow up with MD tomorrow, and will keep appointment.

## 2019-10-01 ENCOUNTER — Observation Stay (HOSPITAL_BASED_OUTPATIENT_CLINIC_OR_DEPARTMENT_OTHER): Payer: Medicare Other

## 2019-10-01 ENCOUNTER — Inpatient Hospital Stay: Payer: Medicare Other

## 2019-10-01 ENCOUNTER — Emergency Department (HOSPITAL_COMMUNITY): Payer: Medicare Other

## 2019-10-01 ENCOUNTER — Encounter (HOSPITAL_COMMUNITY): Payer: Self-pay

## 2019-10-01 ENCOUNTER — Inpatient Hospital Stay: Payer: Medicare Other | Admitting: Hematology and Oncology

## 2019-10-01 DIAGNOSIS — I5031 Acute diastolic (congestive) heart failure: Secondary | ICD-10-CM

## 2019-10-01 DIAGNOSIS — E785 Hyperlipidemia, unspecified: Secondary | ICD-10-CM

## 2019-10-01 DIAGNOSIS — E1159 Type 2 diabetes mellitus with other circulatory complications: Secondary | ICD-10-CM | POA: Diagnosis not present

## 2019-10-01 DIAGNOSIS — R5081 Fever presenting with conditions classified elsewhere: Secondary | ICD-10-CM

## 2019-10-01 DIAGNOSIS — Z20828 Contact with and (suspected) exposure to other viral communicable diseases: Secondary | ICD-10-CM | POA: Diagnosis present

## 2019-10-01 DIAGNOSIS — I4891 Unspecified atrial fibrillation: Secondary | ICD-10-CM | POA: Diagnosis present

## 2019-10-01 DIAGNOSIS — C50919 Malignant neoplasm of unspecified site of unspecified female breast: Secondary | ICD-10-CM | POA: Diagnosis not present

## 2019-10-01 DIAGNOSIS — R05 Cough: Secondary | ICD-10-CM | POA: Diagnosis not present

## 2019-10-01 DIAGNOSIS — E119 Type 2 diabetes mellitus without complications: Secondary | ICD-10-CM

## 2019-10-01 DIAGNOSIS — Z880 Allergy status to penicillin: Secondary | ICD-10-CM | POA: Diagnosis not present

## 2019-10-01 DIAGNOSIS — Z171 Estrogen receptor negative status [ER-]: Secondary | ICD-10-CM

## 2019-10-01 DIAGNOSIS — I252 Old myocardial infarction: Secondary | ICD-10-CM | POA: Diagnosis not present

## 2019-10-01 DIAGNOSIS — I152 Hypertension secondary to endocrine disorders: Secondary | ICD-10-CM | POA: Diagnosis present

## 2019-10-01 DIAGNOSIS — C50411 Malignant neoplasm of upper-outer quadrant of right female breast: Secondary | ICD-10-CM | POA: Diagnosis not present

## 2019-10-01 DIAGNOSIS — R509 Fever, unspecified: Secondary | ICD-10-CM | POA: Diagnosis present

## 2019-10-01 DIAGNOSIS — D709 Neutropenia, unspecified: Secondary | ICD-10-CM

## 2019-10-01 DIAGNOSIS — J9601 Acute respiratory failure with hypoxia: Secondary | ICD-10-CM | POA: Diagnosis present

## 2019-10-01 DIAGNOSIS — E876 Hypokalemia: Secondary | ICD-10-CM | POA: Diagnosis present

## 2019-10-01 DIAGNOSIS — Z8711 Personal history of peptic ulcer disease: Secondary | ICD-10-CM | POA: Diagnosis not present

## 2019-10-01 DIAGNOSIS — I10 Essential (primary) hypertension: Secondary | ICD-10-CM

## 2019-10-01 DIAGNOSIS — E1169 Type 2 diabetes mellitus with other specified complication: Secondary | ICD-10-CM

## 2019-10-01 DIAGNOSIS — J45901 Unspecified asthma with (acute) exacerbation: Secondary | ICD-10-CM | POA: Diagnosis present

## 2019-10-01 DIAGNOSIS — G4733 Obstructive sleep apnea (adult) (pediatric): Secondary | ICD-10-CM | POA: Diagnosis present

## 2019-10-01 DIAGNOSIS — R21 Rash and other nonspecific skin eruption: Secondary | ICD-10-CM | POA: Diagnosis present

## 2019-10-01 DIAGNOSIS — K219 Gastro-esophageal reflux disease without esophagitis: Secondary | ICD-10-CM | POA: Diagnosis present

## 2019-10-01 DIAGNOSIS — I110 Hypertensive heart disease with heart failure: Secondary | ICD-10-CM | POA: Diagnosis present

## 2019-10-01 DIAGNOSIS — D61818 Other pancytopenia: Secondary | ICD-10-CM | POA: Diagnosis present

## 2019-10-01 DIAGNOSIS — I2510 Atherosclerotic heart disease of native coronary artery without angina pectoris: Secondary | ICD-10-CM | POA: Diagnosis present

## 2019-10-01 DIAGNOSIS — J810 Acute pulmonary edema: Secondary | ICD-10-CM | POA: Diagnosis not present

## 2019-10-01 DIAGNOSIS — R0902 Hypoxemia: Secondary | ICD-10-CM | POA: Diagnosis not present

## 2019-10-01 DIAGNOSIS — I5033 Acute on chronic diastolic (congestive) heart failure: Secondary | ICD-10-CM | POA: Diagnosis present

## 2019-10-01 DIAGNOSIS — Z6836 Body mass index (BMI) 36.0-36.9, adult: Secondary | ICD-10-CM | POA: Diagnosis not present

## 2019-10-01 LAB — URINALYSIS, ROUTINE W REFLEX MICROSCOPIC
Bacteria, UA: NONE SEEN
Bilirubin Urine: NEGATIVE
Glucose, UA: NEGATIVE mg/dL
Hgb urine dipstick: NEGATIVE
Ketones, ur: NEGATIVE mg/dL
Leukocytes,Ua: NEGATIVE
Nitrite: NEGATIVE
Protein, ur: NEGATIVE mg/dL
Specific Gravity, Urine: 1.021 (ref 1.005–1.030)
pH: 5 (ref 5.0–8.0)

## 2019-10-01 LAB — COMPREHENSIVE METABOLIC PANEL
ALT: 30 U/L (ref 0–44)
AST: 38 U/L (ref 15–41)
Albumin: 3.2 g/dL — ABNORMAL LOW (ref 3.5–5.0)
Alkaline Phosphatase: 63 U/L (ref 38–126)
Anion gap: 7 (ref 5–15)
BUN: 7 mg/dL — ABNORMAL LOW (ref 8–23)
CO2: 30 mmol/L (ref 22–32)
Calcium: 9 mg/dL (ref 8.9–10.3)
Chloride: 101 mmol/L (ref 98–111)
Creatinine, Ser: 0.71 mg/dL (ref 0.44–1.00)
GFR calc Af Amer: >60 mL/min (ref >60)
GFR calc non Af Amer: >60 mL/min (ref >60)
Glucose, Bld: 108 mg/dL — ABNORMAL HIGH (ref 70–99)
Potassium: 3 mmol/L — ABNORMAL LOW (ref 3.5–5.1)
Sodium: 138 mmol/L (ref 135–145)
Total Bilirubin: 0.3 mg/dL (ref 0.3–1.2)
Total Protein: 7.3 g/dL (ref 6.5–8.1)

## 2019-10-01 LAB — CBC WITH DIFFERENTIAL/PLATELET
Abs Immature Granulocytes: 0.36 10*3/uL — ABNORMAL HIGH (ref 0.00–0.07)
Basophils Absolute: 0 10*3/uL (ref 0.0–0.1)
Basophils Relative: 1 %
Eosinophils Absolute: 0.1 10*3/uL (ref 0.0–0.5)
Eosinophils Relative: 2 %
HCT: 39.8 % (ref 36.0–46.0)
Hemoglobin: 12.3 g/dL (ref 12.0–15.0)
Immature Granulocytes: 11 %
Lymphocytes Relative: 30 %
Lymphs Abs: 1 10*3/uL (ref 0.7–4.0)
MCH: 28.8 pg (ref 26.0–34.0)
MCHC: 30.9 g/dL (ref 30.0–36.0)
MCV: 93.2 fL (ref 80.0–100.0)
Monocytes Absolute: 0.7 10*3/uL (ref 0.1–1.0)
Monocytes Relative: 20 %
Neutro Abs: 1.2 10*3/uL — ABNORMAL LOW (ref 1.7–7.7)
Neutrophils Relative %: 36 %
Platelets: 162 10*3/uL (ref 150–400)
RBC: 4.27 MIL/uL (ref 3.87–5.11)
RDW: 13.6 % (ref 11.5–15.5)
WBC: 3.4 10*3/uL — ABNORMAL LOW (ref 4.0–10.5)
nRBC: 1.2 % — ABNORMAL HIGH (ref 0.0–0.2)

## 2019-10-01 LAB — MRSA PCR SCREENING: MRSA by PCR: NEGATIVE

## 2019-10-01 LAB — CBG MONITORING, ED
Glucose-Capillary: 112 mg/dL — ABNORMAL HIGH (ref 70–99)
Glucose-Capillary: 138 mg/dL — ABNORMAL HIGH (ref 70–99)

## 2019-10-01 LAB — ECHOCARDIOGRAM COMPLETE
Height: 66 in
Weight: 3600 oz

## 2019-10-01 LAB — GLUCOSE, CAPILLARY
Glucose-Capillary: 81 mg/dL (ref 70–99)
Glucose-Capillary: 103 mg/dL — ABNORMAL HIGH (ref 70–99)

## 2019-10-01 LAB — HIV ANTIBODY (ROUTINE TESTING W REFLEX): HIV Screen 4th Generation wRfx: NONREACTIVE

## 2019-10-01 LAB — BRAIN NATRIURETIC PEPTIDE: B Natriuretic Peptide: 88.5 pg/mL (ref 0.0–100.0)

## 2019-10-01 LAB — SARS CORONAVIRUS 2 BY RT PCR (HOSPITAL ORDER, PERFORMED IN ~~LOC~~ HOSPITAL LAB): SARS Coronavirus 2: NEGATIVE

## 2019-10-01 LAB — LACTIC ACID, PLASMA: Lactic Acid, Venous: 1.9 mmol/L (ref 0.5–1.9)

## 2019-10-01 MED ORDER — PREGABALIN 75 MG PO CAPS
150.0000 mg | ORAL_CAPSULE | Freq: Three times a day (TID) | ORAL | Status: DC
  Administered 2019-10-01 – 2019-10-04 (×11): 150 mg via ORAL
  Filled 2019-10-01: qty 2
  Filled 2019-10-01: qty 3
  Filled 2019-10-01 (×9): qty 2

## 2019-10-01 MED ORDER — METFORMIN HCL 500 MG PO TABS
500.0000 mg | ORAL_TABLET | Freq: Two times a day (BID) | ORAL | Status: DC
  Administered 2019-10-01 – 2019-10-04 (×7): 500 mg via ORAL
  Filled 2019-10-01 (×6): qty 1

## 2019-10-01 MED ORDER — METOPROLOL TARTRATE 50 MG PO TABS
50.0000 mg | ORAL_TABLET | Freq: Two times a day (BID) | ORAL | Status: DC
  Administered 2019-10-01 – 2019-10-02 (×3): 50 mg via ORAL
  Filled 2019-10-01: qty 2
  Filled 2019-10-01 (×2): qty 1

## 2019-10-01 MED ORDER — IOHEXOL 350 MG/ML SOLN
100.0000 mL | Freq: Once | INTRAVENOUS | Status: AC | PRN
  Administered 2019-10-01: 100 mL via INTRAVENOUS

## 2019-10-01 MED ORDER — ONDANSETRON HCL 4 MG/2ML IJ SOLN
4.0000 mg | Freq: Four times a day (QID) | INTRAMUSCULAR | Status: DC | PRN
  Administered 2019-10-01: 4 mg via INTRAVENOUS
  Filled 2019-10-01: qty 2

## 2019-10-01 MED ORDER — ATORVASTATIN CALCIUM 40 MG PO TABS
40.0000 mg | ORAL_TABLET | Freq: Every day | ORAL | Status: DC
  Administered 2019-10-01 – 2019-10-03 (×3): 40 mg via ORAL
  Filled 2019-10-01 (×4): qty 1

## 2019-10-01 MED ORDER — FLUTICASONE PROPIONATE 50 MCG/ACT NA SUSP: 2.0000 | Freq: Every evening | NASAL | Status: DC | PRN

## 2019-10-01 MED ORDER — PROCHLORPERAZINE MALEATE 10 MG PO TABS
10.0000 mg | ORAL_TABLET | Freq: Four times a day (QID) | ORAL | Status: DC | PRN
  Filled 2019-10-01: qty 1

## 2019-10-01 MED ORDER — ACETAMINOPHEN 325 MG PO TABS
650.0000 mg | ORAL_TABLET | ORAL | Status: DC | PRN
  Administered 2019-10-04: 650 mg via ORAL
  Filled 2019-10-01: qty 2

## 2019-10-01 MED ORDER — LORAZEPAM 0.5 MG PO TABS: 0.5000 mg | ORAL_TABLET | Freq: Every evening | ORAL | Status: DC | PRN

## 2019-10-01 MED ORDER — OXYCODONE HCL 5 MG PO TABS
20.0000 mg | ORAL_TABLET | ORAL | Status: DC | PRN
  Administered 2019-10-01 – 2019-10-04 (×11): 20 mg via ORAL
  Filled 2019-10-01 (×11): qty 4

## 2019-10-01 MED ORDER — ALBUTEROL SULFATE (2.5 MG/3ML) 0.083% IN NEBU
5.0000 mg | INHALATION_SOLUTION | Freq: Once | RESPIRATORY_TRACT | Status: AC
  Administered 2019-10-01: 5 mg via RESPIRATORY_TRACT
  Filled 2019-10-01: qty 6

## 2019-10-01 MED ORDER — PROMETHAZINE HCL 25 MG PO TABS
25.0000 mg | ORAL_TABLET | Freq: Four times a day (QID) | ORAL | Status: DC | PRN
  Filled 2019-10-01: qty 1

## 2019-10-01 MED ORDER — DEPLIN 7.5 MG PO TABS: 7.5000 mg | ORAL_TABLET | Freq: Every day | ORAL | Status: DC

## 2019-10-01 MED ORDER — SUCRALFATE 1 G PO TABS
1.0000 g | ORAL_TABLET | Freq: Three times a day (TID) | ORAL | Status: DC
  Administered 2019-10-01 – 2019-10-04 (×12): 1 g via ORAL
  Filled 2019-10-01 (×12): qty 1

## 2019-10-01 MED ORDER — POTASSIUM CHLORIDE CRYS ER 20 MEQ PO TBCR
40.0000 meq | EXTENDED_RELEASE_TABLET | ORAL | Status: AC
  Administered 2019-10-01 (×2): 40 meq via ORAL
  Filled 2019-10-01 (×2): qty 2

## 2019-10-01 MED ORDER — ALBUTEROL SULFATE HFA 108 (90 BASE) MCG/ACT IN AERS: 2.0000 | INHALATION_SPRAY | Freq: Four times a day (QID) | RESPIRATORY_TRACT | Status: DC | PRN

## 2019-10-01 MED ORDER — HYDROCODONE-ACETAMINOPHEN 10-325 MG PO TABS: 1.0000 | ORAL_TABLET | Freq: Four times a day (QID) | ORAL | Status: DC | PRN

## 2019-10-01 MED ORDER — SODIUM CHLORIDE 0.9 % IV SOLN: 250.0000 mL | INTRAVENOUS | Status: DC | PRN

## 2019-10-01 MED ORDER — VANCOMYCIN HCL 10 G IV SOLR
1500.0000 mg | INTRAVENOUS | Status: DC
  Administered 2019-10-02: 1500 mg via INTRAVENOUS
  Filled 2019-10-01: qty 1500

## 2019-10-01 MED ORDER — ASPIRIN EC 81 MG PO TBEC
81.0000 mg | DELAYED_RELEASE_TABLET | Freq: Every day | ORAL | Status: DC
  Administered 2019-10-01 – 2019-10-03 (×3): 81 mg via ORAL
  Filled 2019-10-01 (×3): qty 1

## 2019-10-01 MED ORDER — PANTOPRAZOLE SODIUM 40 MG PO TBEC
80.0000 mg | DELAYED_RELEASE_TABLET | Freq: Two times a day (BID) | ORAL | Status: DC
  Administered 2019-10-01 – 2019-10-04 (×7): 80 mg via ORAL
  Filled 2019-10-01 (×7): qty 2

## 2019-10-01 MED ORDER — SODIUM CHLORIDE (PF) 0.9 % IJ SOLN
INTRAMUSCULAR | Status: AC
  Administered 2019-10-01: 04:00:00
  Filled 2019-10-01: qty 50

## 2019-10-01 MED ORDER — MONTELUKAST SODIUM 10 MG PO TABS
10.0000 mg | ORAL_TABLET | Freq: Every day | ORAL | Status: DC
  Administered 2019-10-01 – 2019-10-03 (×3): 10 mg via ORAL
  Filled 2019-10-01 (×4): qty 1

## 2019-10-01 MED ORDER — FUROSEMIDE 10 MG/ML IJ SOLN
40.0000 mg | Freq: Every day | INTRAMUSCULAR | Status: DC
  Administered 2019-10-01 – 2019-10-04 (×4): 40 mg via INTRAVENOUS
  Filled 2019-10-01 (×4): qty 4

## 2019-10-01 MED ORDER — MOMETASONE FURO-FORMOTEROL FUM 100-5 MCG/ACT IN AERO
2.0000 | INHALATION_SPRAY | Freq: Two times a day (BID) | RESPIRATORY_TRACT | Status: DC
  Administered 2019-10-01 – 2019-10-04 (×6): 2 via RESPIRATORY_TRACT
  Filled 2019-10-01: qty 8.8

## 2019-10-01 MED ORDER — ALBUTEROL SULFATE (2.5 MG/3ML) 0.083% IN NEBU
2.5000 mg | INHALATION_SOLUTION | Freq: Four times a day (QID) | RESPIRATORY_TRACT | Status: DC | PRN
  Administered 2019-10-01: 2.5 mg via RESPIRATORY_TRACT
  Filled 2019-10-01: qty 3

## 2019-10-01 MED ORDER — SODIUM CHLORIDE 0.9 % IV SOLN
2.0000 g | Freq: Three times a day (TID) | INTRAVENOUS | Status: DC
  Administered 2019-10-01 – 2019-10-02 (×5): 2 g via INTRAVENOUS
  Filled 2019-10-01 (×6): qty 2

## 2019-10-01 MED ORDER — VANCOMYCIN HCL 10 G IV SOLR
2000.0000 mg | Freq: Once | INTRAVENOUS | Status: AC
  Administered 2019-10-01: 07:00:00 2000 mg via INTRAVENOUS
  Filled 2019-10-01: qty 2000

## 2019-10-01 MED ORDER — CYANOCOBALAMIN 500 MCG PO TABS
500.0000 ug | ORAL_TABLET | Freq: Every day | ORAL | Status: DC
  Administered 2019-10-01 – 2019-10-02 (×2): 500 ug via ORAL
  Filled 2019-10-01 (×2): qty 1

## 2019-10-01 MED ORDER — SODIUM CHLORIDE 0.9% FLUSH: 3.0000 mL | INTRAVENOUS | Status: DC | PRN

## 2019-10-01 MED ORDER — ENOXAPARIN SODIUM 40 MG/0.4ML ~~LOC~~ SOLN
40.0000 mg | Freq: Every day | SUBCUTANEOUS | Status: DC
  Administered 2019-10-01 – 2019-10-04 (×4): 40 mg via SUBCUTANEOUS
  Filled 2019-10-01 (×4): qty 0.4

## 2019-10-01 MED ORDER — SODIUM CHLORIDE 0.9% FLUSH
3.0000 mL | Freq: Two times a day (BID) | INTRAVENOUS | Status: DC
  Administered 2019-10-01 – 2019-10-04 (×6): 3 mL via INTRAVENOUS

## 2019-10-01 MED ORDER — FUROSEMIDE 10 MG/ML IJ SOLN
40.0000 mg | INTRAMUSCULAR | Status: AC
  Administered 2019-10-01: 40 mg via INTRAVENOUS
  Filled 2019-10-01: qty 4

## 2019-10-01 MED ORDER — DOXEPIN HCL 10 MG PO CAPS
20.0000 mg | ORAL_CAPSULE | Freq: Every day | ORAL | Status: DC
  Administered 2019-10-01 – 2019-10-03 (×3): 20 mg via ORAL
  Filled 2019-10-01 (×5): qty 2

## 2019-10-01 MED ORDER — GLIPIZIDE ER 5 MG PO TB24
5.0000 mg | ORAL_TABLET | Freq: Every day | ORAL | Status: DC
  Administered 2019-10-01 – 2019-10-04 (×4): 5 mg via ORAL
  Filled 2019-10-01 (×3): qty 1

## 2019-10-01 MED ORDER — ISOSORBIDE MONONITRATE ER 60 MG PO TB24
60.0000 mg | ORAL_TABLET | Freq: Every day | ORAL | Status: DC
  Administered 2019-10-01 – 2019-10-02 (×2): 60 mg via ORAL
  Filled 2019-10-01 (×2): qty 1

## 2019-10-01 MED ORDER — POTASSIUM CHLORIDE ER 10 MEQ PO TBCR
20.0000 meq | EXTENDED_RELEASE_TABLET | Freq: Every day | ORAL | Status: DC
  Filled 2019-10-01: qty 2

## 2019-10-01 NOTE — ED Notes (Signed)
Pt provided with a breakfast tray.  

## 2019-10-01 NOTE — Plan of Care (Signed)
63 year old female with a history of breast cancer stage IIb diagnosed earlier this month undergoing chemotherapy admitted with fever and rash.  She has history of A. fib not on anticoagulation type 2 diabetes and hypertension.  She developed fever of 102.9 at home.  When she came to the ED her oxygen saturation was 86% on room air.  She does not have oxygen at home.  When I saw her she was on 3 L of oxygen satting above 92%.  She had bilateral wheezing.  She was Covid negative.  CT angiogram negative for pulmonary embolism but does show mild pulmonary edema.  She has a history of recent diagnosis of CHF not on any diuretics at home. She is admitted for congestive heart failure, febrile neutropenia.  Her vital signs are blood pressure 109 x 66 pulse 77 respiration 2096% on 3 L.  Temperature 99.2.  Her physical exam was significant for bilateral wheezing and diminished breath sounds at the bases.  With hypoxia.  Labs significant for white count 3.4 hemoglobin 12.3 platelets 162 potassium 3.0 renal functions normal.  BNP was 88.5. Echo 10/01/2019 shows ejection fraction 60 to 65% left ventricle normal function no left ventricular hypertrophy. CT chest showed no pulmonary embolism cardiomegaly mild pulmonary edema and small right pleural effusion. Continue Vanco and aztreonam she is penicillin allergic for febrile neutropenia of unclear source. Continue Lasix 40 mg IV daily. Continue nebulizer treatments. Check MRSA PCR. This patient needs ongoing hospital care due to persistent hypoxia and fever and neutropenia and recent chemotherapy and fluid overload with pulmonary edema.

## 2019-10-01 NOTE — H&P (Signed)
History and Physical    Shelby Anderson QMG:[id number] DOB: 01/04/1956 DOA: 09/30/2019  PCP: System, Pcp Not In  Patient coming from: Home  I have personally briefly reviewed patient's old medical records in [place]  Chief Complaint: Fever, rash  HPI: Shelby Anderson is a 63 y.o. female with medical history significant of A.fib not on anticoagulation, HTN, DM2, breast cancer Stage 2B, triple negative, diagnosed earlier this month, currently undergoing neoadjuvant chemotherapy (just got first round of taxotere/cytoxan on 10/22).  Plan is for breast conserving surgery followed by adjuvant radiation therapy after chemo.  After chemo patient had N/V/D for a couple of days though this resolved and she was feeling better until last evening when she developed chills and Tm 102.9 at home.  Did notice some SOB which she attributed to asthma.  Dry cough, no hemotpysis no sputum production, no CP.  Does have a sparse rash, patches on trunk that developed after first chemo infusion.  She was told this was a normal side effect.  Took tylenol and presents to ED.   ED Course: Tm in ED is 100.5, O2 sat 86% on RA, satting well on O2 via .  COVID neg.  UA neg.  CTA is neg for PE, does show mild pulmonary edema.  On further questioning, patient does report recently being diagnosed with CHF, not currently on lasix.  EDP ordered dose of lasix, hospitalist asked to admit.   Review of Systems: As per HPI, otherwise all review of systems negative.  Past Medical History:  Diagnosis Date  . Allergy   . Anemia    "chronic"  . Angina   . Anxiety and depression   . Arthritis   . Asthma   . Atrial fibrillation ([place])    h/o "AF w/frequent PVCs"  . Breast cancer ([place])    Left  . Cancer (HCC)    hx of skin cancer   . CHF (congestive heart failure) ([place])   . Chronic back pain greater than 3 months duration    on chronic narcotics, treated at pain clinic  . Colon polyps     hyperplastic  . Coronary artery disease    Arrythmia, orthostatic hypotension, HLD, HTN; sees Dr. Einar Gip  . Difficult intubation    "TMJ & woke up when they were still cutting on me"  . Dysrhythmia    sees Dr. Einar Gip and a cardiologist at [facility]  . Esophageal stricture   . Family history of melanoma ]  . Family history of pancreatic cancer   . Fatty liver   . Fibroids   . Fibromyalgia    "in my legs"  . GERD (gastroesophageal reflux disease)    hx hiatal hernia, stricture and gastric ulcer  . Headache(784.0)   . Heart murmur   . Hiatal hernia   . History of loop recorder   . History of migraines    "dx'd when I was in my teens"  . Hyperlipemia   . Hypertension   . Mental disorder   . Mild episode of recurrent major depressive disorder ([place]) 12/06/2015  . Myocardial infarction [facility]) 1980's & 1990;   sees Dr. Einar Gip  . OSA (obstructive sleep apnea)   . Pneumonia   . PONV (postoperative nausea and vomiting)   . Recurrent upper respiratory infection (URI)   . Shortness of breath 11/20/11   "all the time", sees pulmonlogy, ? asthma  . Stenotic cervical os   . Stomach ulcer    "3 small;  found in 05/2011"  . TMJ (dislocation of temporomandibular joint)   . Tuberculosis    + TB SKIN TEST  . Type 2 diabetes mellitus without complication, without long-term current use of insulin ([place]) 12/06/2015   not on meds     Past Surgical History:  Procedure Laterality Date  . ACHILLES TENDON REPAIR  1970's   left ankle  . ARTHROSCOPIC REPAIR ACL     left knee cap  . BREAST BIOPSY Right 04/08/2013  . CARDIAC CATHETERIZATION     loop recorder  . CARPAL TUNNEL RELEASE  unknown   left hand  . ESOPHAGOGASTRODUODENOSCOPY (EGD) WITH PROPOFOL N/A 10/29/2017   Procedure: ESOPHAGOGASTRODUODENOSCOPY (EGD) WITH PROPOFOL;  Surgeon: Irene Shipper, MD;  Location: [facility];  Service: Endoscopy;  Laterality: N/A;  . LOOP RECORDER IMPLANT    . PORTACATH PLACEMENT N/A 09/23/2019   Procedure:  INSERTION PORT-A-CATH Right Internal Doreen Salvage;  Surgeon: Rolm Bookbinder, MD;  Location: [place];  Service: General;  Laterality: N/A;  . post ganglionectomy  1970's   "for migraine headaches"  . pouch string  [phone]   "did this 3 times (once w/each pregnancy)"  . SAVORY DILATION N/A 10/29/2017   Procedure: SAVORY DILATION;  Surgeon: Irene Shipper, MD;  Location: [facility];  Service: Endoscopy;  Laterality: N/A;  . TOTAL KNEE ARTHROPLASTY Left 09/25/2016   Procedure: LEFT TOTAL KNEE ARTHROPLASTY;  Surgeon: Paralee Cancel, MD;  Location: [facility];  Service: Orthopedics;  Laterality: Left;  . TUBAL LIGATION  1980's     reports that she has never smoked. She has never used smokeless tobacco. She reports that she does not drink alcohol or use drugs.  Allergies  Allergen Reactions  . Lodine [Etodolac] Anaphylaxis, Hives and Swelling  . Oxycontin [Oxycodone Hcl] Anaphylaxis    hives, trouble breathing, tongue swelling (Only Oxycontin) Tolerates plain oxycodone.  Marland Kitchen Penicillins Anaphylaxis    Told by a surgeon never to take it again. Has patient had a PCN reaction causing immediate rash, facial/tongue/throat swelling, SOB or lightheadedness with hypotension: Yes Has patient had a PCN reaction causing severe rash involving mucus membranes or skin necrosis: Unknown Has patient had a PCN reaction that required hospitalization: No Has patient had a PCN reaction occurring within the last 10 years: No If all of the above answers are "NO", then may proceed with Cephalosporin use.  . Aspirin Other (See Comments)    High-dose caused GI Bleeds  . Darvocet [Propoxyphene N-Acetaminophen] Hives  . Nitroglycerin     IV-BP drops dramatically  . Tramadol Hives and Itching  . Ultram [Tramadol Hcl] Hives  . Valium Other (See Comments)    Circulation problems. "Legs turned black".    Family History  Problem Relation Age of Onset  . Malignant hyperthermia Father   . Hypertension Father    . Heart disease Father   . Diabetes Father   . Cancer Father        skin  . Hypertension Mother   . Heart disease Mother   . Multiple myeloma Mother   . Cancer Sister        CERVICAL  . Hypertension Sister   . Cancer Brother 22       MELANOMA  . Heart disease Maternal Grandmother   . Other Maternal Grandmother 32       complications of childbirth  . Heart disease Maternal Grandfather   . Cancer Paternal Grandmother        ?   Marland Kitchen Heart disease Paternal  Grandmother   . Heart disease Paternal Grandfather   . Cancer Brother        LUNG  . Diabetes Sister   . Hypertension Sister   . Heart disease Sister   . Cancer Sister   . Cancer Brother   . Pancreatic cancer Niece 84  . Cancer Nephew 40       unknown- currently in the [employer]  . Anesthesia problems Neg Hx   . Hypotension Neg Hx   . Pseudochol deficiency Neg Hx   . Colon cancer Neg Hx   . Esophageal cancer Neg Hx   . Rectal cancer Neg Hx   . Stomach cancer Neg Hx   . Breast cancer Neg Hx      Prior to Admission medications   Medication Sig Start Date End Date Taking? Authorizing Provider  albuterol (PROVENTIL) (2.5 MG/3ML) 0.083% nebulizer solution Take 3 mLs (2.5 mg total) by nebulization every 6 (six) hours as needed for wheezing or shortness of breath (J45.40). 09/19/16  Yes Javier Glazier, MD  albuterol (VENTOLIN HFA) 108 (90 Base) MCG/ACT inhaler Inhale 2 puffs into the lungs every 6 (six) hours as needed for wheezing or shortness of breath. 09/07/19  Yes Rigoberto Noel, MD  aspirin EC 81 MG tablet Take 81 mg at bedtime by mouth.    Yes [provider]  atorvastatin (LIPITOR) 40 MG tablet TAKE 1 TABLET BY MOUTH EVERY DAY Patient taking differently: Take 40 mg by mouth daily.  09/16/19  Yes Colin Benton R, DO  budesonide-formoterol (SYMBICORT) 80-4.5 MCG/ACT inhaler INHALE 2 PUFFS INTO THE LUNGS EVERY MORNING AND ANOTHER 2 PUFFS 12 HOURS LATER Patient taking differently: Inhale 2 puffs into the lungs 2  (two) times daily.  12/16/18  Yes Martyn Ehrich, NP  CINNAMON PO Take 1,000 mg 2 (two) times daily by mouth.   Yes [provider]  doxepin (SINEQUAN) 10 MG capsule Take 20 mg by mouth at bedtime.  03/27/19  Yes [provider]  esomeprazole (NEXIUM) 40 MG capsule Take 1 capsule (40 mg total) by mouth 2 (two) times daily. 08/19/19  Yes Irene Shipper, MD  fluticasone [facility]) 50 MCG/ACT nasal spray Place 2 sprays at bedtime as needed into both nostrils for allergies or rhinitis.    Yes [provider]  glipiZIDE (GLUCOTROL XL) 5 MG 24 hr tablet TAKE 1 TABLET BY MOUTH EVERY DAY WITH BREAKFAST Patient taking differently: Take 5 mg by mouth daily with breakfast.  06/22/19  Yes Lucretia Kern, DO  HYDROcodone-acetaminophen (NORCO) 10-325 MG tablet Take 1 tablet by mouth every 6 (six) hours as needed. Patient taking differently: Take 1 tablet by mouth every 6 (six) hours as needed for moderate pain.  09/23/19 09/22/20 Yes Rolm Bookbinder, MD  isosorbide mononitrate (IMDUR) 60 MG 24 hr tablet TAKE 1 TABLET BY MOUTH EVERY DAY Patient taking differently: Take 60 mg by mouth daily.  09/11/19  Yes Colin Benton R, DO  L-Methylfolate (DEPLIN) 7.5 MG TABS Take 7.5 mg by mouth daily with breakfast.    Yes [provider]  lidocaine-prilocaine (EMLA) cream Apply to affected area once Patient taking differently: Apply 1 application topically daily as needed (numbing).  09/10/19  Yes Nicholas Lose, MD  LORazepam (ATIVAN) 0.5 MG tablet Take 1 tablet (0.5 mg total) by mouth at bedtime as needed for sleep. 09/10/19  Yes Nicholas Lose, MD  LYRICA 150 MG capsule Take 150 mg by mouth 3 (three) times daily.  05/28/18  Yes  [provider]  metFORMIN (GLUCOPHAGE) 500 MG tablet TAKE 1 TABLET (500 MG TOTAL) BY MOUTH 2 (TWO) TIMES DAILY WITH A MEAL. 09/28/19  Yes Koberlein, Junell C, MD  metoprolol tartrate (LOPRESSOR) 50 MG tablet TAKE 1 TABLET BY MOUTH TWICE A DAY Patient taking  differently: Take 50 mg by mouth 2 (two) times daily.  09/11/19  Yes Lucretia Kern, DO  Misc Natural Products (GLUCOS-CHONDROIT-MSM COMPLEX PO) Take 1 tablet 3 (three) times daily by mouth.    Yes [provider]  mometasone (ELOCON) 0.1 % cream Apply 1 application topically daily as needed (irritation).  05/17/19  Yes [provider]  montelukast (SINGULAIR) 10 MG tablet TAKE 1 TABLET BY MOUTH EVERYDAY AT BEDTIME Patient taking differently: Take 10 mg by mouth at bedtime.  08/13/19  Yes Rigoberto Noel, MD  Multiple Vitamin (MULITIVITAMIN WITH MINERALS) TABS Take 1 tablet by mouth daily.     Yes [provider]  nitroGLYCERIN (NITROSTAT) 0.4 MG SL tablet Place 0.4 mg under the tongue every 5 (five) minutes as needed for chest pain.    Yes [provider]  ondansetron (ZOFRAN) 4 MG tablet Take 1 tablet (4 mg total) by mouth every 4 (four) hours as needed for nausea or vomiting. 08/19/19  Yes Irene Shipper, MD  ondansetron (ZOFRAN) 8 MG tablet Take 1 tablet (8 mg total) by mouth 2 (two) times daily as needed for refractory nausea / vomiting. Start on day 3 after chemo. 09/10/19  Yes Nicholas Lose, MD  Oxycodone HCl 20 MG TABS Take 20 mg every 4 (four) hours as needed by mouth (pain).  08/09/17  Yes [provider]  potassium chloride (K-DUR) 10 MEQ tablet TAKE 2 TABLETS BY MOUTH EVERY DAY Patient taking differently: Take 20 mEq by mouth daily.  07/23/19  Yes Lucretia Kern, DO  prochlorperazine (COMPAZINE) 10 MG tablet Take 1 tablet (10 mg total) by mouth every 6 (six) hours as needed (Nausea or vomiting). 09/10/19  Yes Nicholas Lose, MD  promethazine (PHENERGAN) 25 MG tablet Take 1 tablet (25 mg total) by mouth every 6 (six) hours as needed for nausea or vomiting. 08/19/19  Yes Irene Shipper, MD  sucralfate (CARAFATE) 1 g tablet Take 1 tablet (1 g total) by mouth 4 (four) times daily -  with meals and at bedtime. Before taking slowly dissolve tablet in 1 Tablespoon of  distilled water for about 15 minutes to creat a slurry. 08/19/19  Yes Irene Shipper, MD  tiZANidine (ZANAFLEX) 4 MG tablet Take 4 mg by mouth every 8 (eight) hours as needed for muscle spasms.   Yes [provider]  vitamin B-12 (CYANOCOBALAMIN) 500 MCG tablet Take 500 mcg by mouth daily.    Yes [provider]  OXYGEN Inhale 2 L into the lungs at bedtime as needed (shortness of breath).     [provider]    Physical Exam: Vitals:   10/01/19 0315 10/01/19 0345 10/01/19 0400 10/01/19 0415  BP: 123/68 127/60 110/64 122/70  Pulse:  77 72 80  Resp: 19 (!) 23 13 (!) 21  Temp:      TempSrc:      SpO2:  100% 99% 99%  Weight:      Height:        Constitutional: NAD, calm, comfortable Eyes: PERRL, lids and conjunctivae normal ENMT: Mucous membranes are moist. Posterior pharynx clear of any exudate or lesions.Normal dentition.  Neck: normal, supple, no masses, no thyromegaly Respiratory: clear  to auscultation bilaterally, no wheezing, no crackles. Normal respiratory effort. No accessory muscle use.  Cardiovascular: Regular rate and rhythm, no murmurs / rubs / gallops. No extremity edema. 2+ pedal pulses. No carotid bruits.  Abdomen: no tenderness, no masses palpated. No hepatosplenomegaly. Bowel sounds positive.  Musculoskeletal: no clubbing / cyanosis. No joint deformity upper and lower extremities. Good ROM, no contractures. Normal muscle tone.  Skin: no rashes, lesions, ulcers. No induration Neurologic: CN 2-12 grossly intact. Sensation intact, DTR normal. Strength 5/5 in all 4.  Psychiatric: Normal judgment and insight. Alert and oriented x 3. Normal mood.    Labs on Admission: I have personally reviewed following labs and imaging studies  CBC: Recent Labs  Lab 09/24/19 0814 10/01/19 0051  WBC 8.0 3.4*  NEUTROABS 6.5 1.2*  HGB 11.9* 12.3  HCT 37.8 39.8  MCV 92.4 93.2  PLT 250 412   Basic Metabolic Panel: Recent Labs  Lab 09/24/19 0814  10/01/19 0051  NA 143 138  K 4.2 3.0*  CL 106 101  CO2 28 30  GLUCOSE 156* 108*  BUN 10 7*  CREATININE 0.72 0.71  CALCIUM 8.9 9.0   GFR: Estimated Creatinine Clearance: 89.1 mL/min (by C-G formula based on SCr of 0.71 mg/dL). Liver Function Tests: Recent Labs  Lab 09/24/19 0814 10/01/19 0051  AST 28 38  ALT 30 30  ALKPHOS 57 63  BILITOT 0.4 0.3  PROT 7.0 7.3  ALBUMIN 3.2* 3.2*   No results for input(s): LIPASE, AMYLASE in the last 168 hours. No results for input(s): AMMONIA in the last 168 hours. Coagulation Profile: No results for input(s): INR, PROTIME in the last 168 hours. Cardiac Enzymes: No results for input(s): CKTOTAL, CKMB, CKMBINDEX, TROPONINI in the last 168 hours. BNP (last 3 results) No results for input(s): PROBNP in the last 8760 hours. HbA1C: No results for input(s): HGBA1C in the last 72 hours. CBG: No results for input(s): GLUCAP in the last 168 hours. Lipid Profile: No results for input(s): CHOL, HDL, LDLCALC, TRIG, CHOLHDL, LDLDIRECT in the last 72 hours. Thyroid Function Tests: No results for input(s): TSH, T4TOTAL, FREET4, T3FREE, THYROIDAB in the last 72 hours. Anemia Panel: No results for input(s): VITAMINB12, FOLATE, FERRITIN, TIBC, IRON, RETICCTPCT in the last 72 hours. Urine analysis:    Component Value Date/Time   COLORURINE YELLOW 10/01/2019 0051   APPEARANCEUR CLEAR 10/01/2019 0051   LABSPEC 1.021 10/01/2019 0051   PHURINE 5.0 10/01/2019 0051   GLUCOSEU NEGATIVE 10/01/2019 0051   GLUCOSEU NEGATIVE 01/05/2013 1535   HGBUR NEGATIVE 10/01/2019 0051   BILIRUBINUR NEGATIVE 10/01/2019 0051   BILIRUBINUR n 10/27/2014 1118   KETONESUR NEGATIVE 10/01/2019 0051   PROTEINUR NEGATIVE 10/01/2019 0051   UROBILINOGEN 0.2 10/27/2014 1118   UROBILINOGEN 1.0 12/23/2013 1344   NITRITE NEGATIVE 10/01/2019 0051   LEUKOCYTESUR NEGATIVE 10/01/2019 0051    Radiological Exams on Admission: Ct Angio Chest Pe W And/or Wo Contrast  Result Date:  10/01/2019 CLINICAL DATA:  Breast cancer and interstitial lung disease with new onset hypoxia. EXAM: CT ANGIOGRAPHY CHEST WITH CONTRAST TECHNIQUE: Multidetector CT imaging of the chest was performed using the standard protocol during bolus administration of intravenous contrast. Multiplanar CT image reconstructions and MIPs were obtained to evaluate the vascular anatomy. CONTRAST:  159m OMNIPAQUE IOHEXOL 350 MG/ML SOLN COMPARISON:  CTA chest 10/05/2016 FINDINGS: Cardiovascular: --Pulmonary arteries: Contrast injection is sufficient to demonstrate satisfactory opacification of the pulmonary arteries to the segmental level, with attenuation of at least 200 HU at the main pulmonary  artery. There is no pulmonary embolus. The main pulmonary artery is within normal limits for size. --Aorta: Limited opacification of the aorta due to bolus timing optimization for the pulmonary arteries. The course and caliber of the aorta are normal. Conventional 3 vessel aortic branching pattern. There is no aortic atherosclerosis. --Heart: Mild cardiomegaly. No pericardial effusion. Mediastinum/Nodes: No mediastinal adenopathy. The thyroid gland is normal. Normal course of the esophagus. Lungs/Pleura: There is mild pulmonary edema. Small right pleural effusion. Upper Abdomen: Contrast bolus timing is not optimized for evaluation of the abdominal organs. The visualized portions of the organs of the upper abdomen are normal. Musculoskeletal: No chest wall abnormality. No bony spinal canal stenosis. Review of the MIP images confirms the above findings. IMPRESSION: 1. No pulmonary embolus. 2. Cardiomegaly and mild pulmonary edema. 3. Small right pleural effusion. Electronically Signed   By: Ulyses Jarred M.D.   On: 10/01/2019 03:55   Dg Chest Port 1 View  Result Date: 10/01/2019 CLINICAL DATA:  Shortness of breath and fever. On chemotherapy. EXAM: PORTABLE CHEST 1 VIEW COMPARISON:  Chest radiograph 09/23/2019 FINDINGS: There is a right  chest wall power-injectable Port-A-Cath with tip at the cavoatrial junction via a right internal jugular vein approach. The heart size and mediastinal contours are within normal limits. There is shallow lung inflation without focal consolidation. The visualized skeletal structures are unremarkable. IMPRESSION: No active cardiopulmonary disease. Electronically Signed   By: Ulyses Jarred M.D.   On: 10/01/2019 00:01    EKG: Independently reviewed.  Assessment/Plan Principal Problem:   Acute on chronic diastolic CHF (congestive heart failure) (HCC) Active Problems:   Hypertension associated with diabetes ([place])   Type 2 diabetes mellitus without complication, without long-term current use of insulin ([place])   Hyperlipidemia associated with type 2 diabetes mellitus ([place])   Malignant neoplasm of upper-outer quadrant of right breast in female, estrogen receptor negative (HCC)   Neutropenia with fever ([place])    1. Acute on chronic diastolic CHF - 1. CHF pathway 2. Lasix '40mg'$  IV x1 in ED then '40mg'$  IV daily 3. Tele monitor 4. 2d echo 5. Strict intake and output 6. Daily BMP 2. Febrile neutropenia - 1. Unclear cause or source 2. Patient is just neutropenic at this point with an [place] of 1100 (using the <1500 definition). 3. Will put on aztreonam and vanc (Severe PCN allergy). 4. Already got GCSF with chemo 5. Consult put into epic to heme/onc. 6. BCx pending 3. Hypokalemia - 1. 74mq PO K Q4H for 2 doses 2. Repeat BMP tomorrow AM. 4. HTN - 1. Continue metoprolol 2. Continue imdur 5. DM2 - 1. Will continue home metformin and glipizide 2. CBG checks AC/HS 6. Breast CA - 1. Heme/onc consult put into Epic as above 2. Patient had first chemo on 10/22 7. HLD - cont statin  DVT prophylaxis: Lovenox Code Status: Full Family Communication: No family in room Disposition Plan: Home after admit Consults called: Consult put into epic to heme/onc [place] presumably convert  to IP later today for febrile neutropenia    , JGlacier ViewHospitalists  How to contact the [facility] Medical CenterAttending or Consulting provider 7Gravityor covering provider during after hours [place] for this patient?  1. Check the care team in [facility] look for a) attending/consulting TR[facility] provider listed and b) the [facility] Hospitalteam listed 2. Log into [email or web address]  Amion Physician Scheduling and messaging for groups and whole hospitals  On call and physician  scheduling software for group practices, residents, hospitalists and other medical providers for call, clinic, rotation and shift schedules. OnCall Enterprise is a hospital-wide system for scheduling doctors and paging doctors on call. EasyPlot is for scientific plotting and data analysis.  [email or web address]  and use [place]'s universal password to access. If you do not have the password, please contact the hospital operator.  3. Locate the [facility] provider you are looking for under [facility] and page to a number that you can be directly reached. 4. If you still have difficulty reaching the provider, please page the [facility] (Director on Call) for the Hospitalists listed on amion for assistance.  10/01/2019, 4:57 AM

## 2019-10-01 NOTE — ED Notes (Signed)
Pt ambulated to restroom without O2, had audible wheezing upon return. Pt had removed O2 for awhile- but now is put back on at 3lpm.

## 2019-10-01 NOTE — Plan of Care (Signed)
Pt is aware of safety plan and will call when needing to get out of bed. All belongings within reach

## 2019-10-01 NOTE — Progress Notes (Signed)
Pharmacy Antibiotic Note  Shelby Anderson is a 63 y.o. female admitted on 09/30/2019 with febrile neutropenia.  Pharmacy has been consulted for vancomycin dosing.  Plan: Vancomycin 2 Gm x1 then 1500 mg IV q24h for est AUC = 475 Goal AUC = 400-550 F/u scr/cultures Azactam 2 Gm IV q8h (MD)  Height: 5\' 6"  (167.6 cm) Weight: 225 lb (102.1 kg) IBW/kg (Calculated) : 59.3  Temp (24hrs), Avg:100.5 F (38.1 C), Min:100.5 F (38.1 C), Max:100.5 F (38.1 C)  Recent Labs  Lab 09/24/19 0814 10/01/19 0051  WBC 8.0 3.4*  CREATININE 0.72 0.71  LATICACIDVEN  --  1.9    Estimated Creatinine Clearance: 89.1 mL/min (by C-G formula based on SCr of 0.71 mg/dL).    Allergies  Allergen Reactions  . Lodine [Etodolac] Anaphylaxis, Hives and Swelling  . Oxycontin [Oxycodone Hcl] Anaphylaxis    hives, trouble breathing, tongue swelling (Only Oxycontin) Tolerates plain oxycodone.  Marland Kitchen Penicillins Anaphylaxis    Told by a surgeon never to take it again. Has patient had a PCN reaction causing immediate rash, facial/tongue/throat swelling, SOB or lightheadedness with hypotension: Yes Has patient had a PCN reaction causing severe rash involving mucus membranes or skin necrosis: Unknown Has patient had a PCN reaction that required hospitalization: No Has patient had a PCN reaction occurring within the last 10 years: No If all of the above answers are "NO", then may proceed with Cephalosporin use.  . Aspirin Other (See Comments)    High-dose caused GI Bleeds  . Darvocet [Propoxyphene N-Acetaminophen] Hives  . Nitroglycerin     IV-BP drops dramatically  . Tramadol Hives and Itching  . Ultram [Tramadol Hcl] Hives  . Valium Other (See Comments)    Circulation problems. "Legs turned black".    Antimicrobials this admission: 10/26 azactam >>  10/26 vancomycin >>   Dose adjustments this admission:   Microbiology results:  BCx:   UCx:    Sputum:    MRSA PCR:   Thank you for allowing  pharmacy to be a part of this patient's care.  Dorrene German 10/01/2019 5:44 AM

## 2019-10-01 NOTE — Assessment & Plan Note (Deleted)
09/07/2019:Routine screening mammogram detected a 2.1cm mass in the left breast and no left axillary adenopathy. 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 T2N0 stage IIb  Treatment plan: 1.  Neoadjuvant chemotherapy with Taxotere/Cytoxan x 6 started 09/24/2019 2. Breast conserving surgery followed by 3. Adjuvant radiation therapy  ------------------------------------------------------------------------------------------------------------------------------------------------- Current treatment: Cycle 1 day 8 Taxotere and Cytoxan Chemo toxicities:  Return to clinic in 2 weeks for cycle 2

## 2019-10-01 NOTE — Progress Notes (Signed)
Echocardiogram 2D Echocardiogram has been performed.  Oneal Deputy Kourtney Terriquez 10/01/2019, 9:19 AM

## 2019-10-02 ENCOUNTER — Other Ambulatory Visit: Payer: Self-pay | Admitting: *Deleted

## 2019-10-02 DIAGNOSIS — I5033 Acute on chronic diastolic (congestive) heart failure: Secondary | ICD-10-CM

## 2019-10-02 LAB — CBC
HCT: 36.8 % (ref 36.0–46.0)
Hemoglobin: 11.3 g/dL — ABNORMAL LOW (ref 12.0–15.0)
MCH: 28.9 pg (ref 26.0–34.0)
MCHC: 30.7 g/dL (ref 30.0–36.0)
MCV: 94.1 fL (ref 80.0–100.0)
Platelets: 179 10*3/uL (ref 150–400)
RBC: 3.91 MIL/uL (ref 3.87–5.11)
RDW: 13.9 % (ref 11.5–15.5)
WBC: 13.9 10*3/uL — ABNORMAL HIGH (ref 4.0–10.5)
nRBC: 1 % — ABNORMAL HIGH (ref 0.0–0.2)

## 2019-10-02 LAB — BASIC METABOLIC PANEL
Anion gap: 10 (ref 5–15)
BUN: 7 mg/dL — ABNORMAL LOW (ref 8–23)
CO2: 32 mmol/L (ref 22–32)
Calcium: 8.7 mg/dL — ABNORMAL LOW (ref 8.9–10.3)
Chloride: 101 mmol/L (ref 98–111)
Creatinine, Ser: 0.64 mg/dL (ref 0.44–1.00)
GFR calc Af Amer: >60 mL/min (ref >60)
GFR calc non Af Amer: >60 mL/min (ref >60)
Glucose, Bld: 94 mg/dL (ref 70–99)
Potassium: 3.2 mmol/L — ABNORMAL LOW (ref 3.5–5.1)
Sodium: 143 mmol/L (ref 135–145)

## 2019-10-02 LAB — GLUCOSE, CAPILLARY
Glucose-Capillary: 93 mg/dL (ref 70–99)
Glucose-Capillary: 153 mg/dL — ABNORMAL HIGH (ref 70–99)
Glucose-Capillary: 120 mg/dL — ABNORMAL HIGH (ref 70–99)
Glucose-Capillary: 144 mg/dL — ABNORMAL HIGH (ref 70–99)

## 2019-10-02 LAB — MAGNESIUM: Magnesium: 1.5 mg/dL — ABNORMAL LOW (ref 1.7–2.4)

## 2019-10-02 LAB — URINE CULTURE: Culture: <10000 — AB

## 2019-10-02 MED ORDER — CHLORHEXIDINE GLUCONATE CLOTH 2 % EX PADS
6.0000 | MEDICATED_PAD | Freq: Every day | CUTANEOUS | Status: DC
  Administered 2019-10-03: 6 via TOPICAL

## 2019-10-02 MED ORDER — MAGNESIUM SULFATE 50 % IJ SOLN: 1.0000 g | Freq: Once | INTRAMUSCULAR | Status: DC

## 2019-10-02 MED ORDER — POTASSIUM CHLORIDE CRYS ER 10 MEQ PO TBCR
40.0000 meq | EXTENDED_RELEASE_TABLET | Freq: Once | ORAL | Status: AC
  Administered 2019-10-02: 40 meq via ORAL
  Filled 2019-10-02: qty 4
  Filled 2019-10-02: qty 2

## 2019-10-02 MED ORDER — L-METHYLFOLATE-B6-B12 3-35-2 MG PO TABS
1.0000 | ORAL_TABLET | Freq: Two times a day (BID) | ORAL | Status: DC
  Administered 2019-10-03 – 2019-10-04 (×3): 1 via ORAL
  Filled 2019-10-02 (×4): qty 1

## 2019-10-02 MED ORDER — ISOSORBIDE MONONITRATE ER 30 MG PO TB24
15.0000 mg | ORAL_TABLET | Freq: Every day | ORAL | Status: DC
  Administered 2019-10-03 – 2019-10-04 (×2): 15 mg via ORAL
  Filled 2019-10-02 (×2): qty 1

## 2019-10-02 MED ORDER — POTASSIUM CHLORIDE CRYS ER 20 MEQ PO TBCR: 40.0000 meq | EXTENDED_RELEASE_TABLET | Freq: Once | ORAL | Status: DC

## 2019-10-02 MED ORDER — POTASSIUM CHLORIDE CRYS ER 10 MEQ PO TBCR
20.0000 meq | EXTENDED_RELEASE_TABLET | Freq: Every day | ORAL | Status: DC
  Administered 2019-10-02: 20 meq via ORAL
  Filled 2019-10-02: qty 2

## 2019-10-02 MED ORDER — METOPROLOL TARTRATE 25 MG PO TABS
12.5000 mg | ORAL_TABLET | Freq: Two times a day (BID) | ORAL | Status: DC
  Administered 2019-10-02 – 2019-10-04 (×4): 12.5 mg via ORAL
  Filled 2019-10-02 (×4): qty 1

## 2019-10-02 MED ORDER — DIPHENHYDRAMINE HCL 25 MG PO CAPS: 25.0000 mg | ORAL_CAPSULE | Freq: Four times a day (QID) | ORAL | Status: DC | PRN

## 2019-10-02 MED ORDER — MAGNESIUM SULFATE IN D5W 1-5 GM/100ML-% IV SOLN
1.0000 g | Freq: Once | INTRAVENOUS | Status: AC
  Administered 2019-10-02: 1 g via INTRAVENOUS
  Filled 2019-10-02: qty 100

## 2019-10-02 MED ORDER — CLOBETASOL PROPIONATE 0.05 % EX CREA
TOPICAL_CREAM | Freq: Two times a day (BID) | CUTANEOUS | Status: DC
  Administered 2019-10-02 – 2019-10-03 (×3): via TOPICAL
  Filled 2019-10-02: qty 15

## 2019-10-02 MED ORDER — POTASSIUM CHLORIDE CRYS ER 10 MEQ PO TBCR
40.0000 meq | EXTENDED_RELEASE_TABLET | ORAL | Status: AC
  Administered 2019-10-02: 40 meq via ORAL
  Filled 2019-10-02: qty 4

## 2019-10-02 NOTE — Progress Notes (Signed)
PROGRESS NOTE    Shelby Anderson  [id number] DOB: 1956/04/24 DOA: 09/30/2019 PCP: System, Pcp Not In   Brief Narrative:63 y.o. female with medical history significant of A.fib not on anticoagulation, HTN, DM2, breast cancer Stage 2B, triple negative, diagnosed earlier this month, currently undergoing neoadjuvant chemotherapy (just got first round of taxotere/cytoxan on 10/22).  Plan is for breast conserving surgery followed by adjuvant radiation therapy after chemo.  After chemo patient had N/V/D for a couple of days though this resolved and she was feeling better until last evening when she developed chills and Tm 102.9 at home.  Did notice some SOB which she attributed to asthma.  Dry cough, no hemotpysis no sputum production, no CP.  Does have a sparse rash, patches on trunk that developed after first chemo infusion.  She was told this was a normal side effect.  Took tylenol and presents to ED.   ED Course: Tm in ED is 100.5, O2 sat 86% on RA, satting well on O2 via [place].  COVID neg.  UA neg.  CTA is neg for PE, does show mild pulmonary edema.  On further questioning, patient does report recently being diagnosed with CHF, not currently on lasix.  EDP ordered dose of lasix, hospitalist asked to admit.  Assessment & Plan:   Principal Problem:   Acute on chronic diastolic CHF (congestive heart failure) (HCC) Active Problems:   Hypertension associated with diabetes ([place])   Type 2 diabetes mellitus without complication, without long-term current use of insulin (HCC)   Hyperlipidemia associated with type 2 diabetes mellitus (HCC)   Malignant neoplasm of upper-outer quadrant of right breast in female, estrogen receptor negative (HCC)   Neutropenia with fever ([place])   #1 acute on chronic diastolic CHF exacerbation-patient has a diagnosis of congestive heart failure but was not on any diuretics prior to admission to hospital.  She was hypoxic 86% on room air on arrival.   Chest CT showed pulmonary edema.  Echo shows ejection fraction 60 to 65%. Continue IV Lasix 40 mg daily.  Her blood pressure is too soft to increase the dose of Lasix.  However she feels better but not back to her baseline yet.  Still dependent on oxygen at 2 L. Ambulate the patient Check pulse ox on room air Blood culture still negative. Urine culture with minimal growth. Continue IV antibiotics for another 24 hours if she remains afebrile will de-escalate antibiotics.  #2 fever with rash after chemotherapy with neutropenia-on aztreonam and Vanco.  Covid negative..  Neutropenia has resolved today her white count is 13.9.  #3 breast cancer followed by oncology.  #4 hypertension on Imdur 60 mg daily we will decrease the dose as her blood pressure is running soft.  Decrease metoprolol.  #5 type 2 diabetes continue glipizide and Metformin.  Blood sugar stable blood sugar anywhere from 81-1 53.  #6 rash?  Secondary to chemo clobetasol ordered by oncology  #7 hypokalemia and hypomagnesemia replete  #8 hyper lipidemia continue Lipitor     Estimated body mass index is 35.9 kg/m as calculated from the following:   Height as of this encounter: 5\' 6"  (1.676 m).   Weight as of this encounter: 100.9 kg.  DVT prophylaxis: Lovenox  code Status: Full code Family Communication: None Disposition Plan: Pending clinical improvement   Consultants:   Oncology  Procedures: None Antimicrobials: Vanco and aztreonam Subjective: Patient reports she is feeling slightly better but not back to her baseline continues to have wheezing and shortness of  breath.  Still on oxygen. Objective: Vitals:   10/02/19 0518 10/02/19 0722 10/02/19 0851 10/02/19 1234  BP: 101/64  115/68 (!) 103/55  Pulse: (!) 55  68 65  Resp: 12   20  Temp: 97.9 F (36.6 C)   98.4 F (36.9 C)  TempSrc: Oral   Oral  SpO2: 100% 97%  98%  Weight: 100.9 kg     Height:        Intake/Output Summary (Last 24 hours) at  10/02/2019 1510 Last data filed at 10/02/2019 1031 Gross per 24 hour  Intake 722.11 ml  Output 901 ml  Net -178.89 ml   Filed Weights   09/30/19 2258 10/02/19 0518  Weight: 102.1 kg 100.9 kg    Examination:  General exam: Appears calm and comfortable  Respiratory system: Wheezing bilaterally to auscultation. Respiratory effort normal. Cardiovascular system: S1 & S2 heard, RRR. No JVD, murmurs, rubs, gallops or clicks. No pedal edema. Gastrointestinal system: Abdomen is nondistended, soft and nontender. No organomegaly or masses felt. Normal bowel sounds heard. Central nervous system: Alert and oriented. No focal neurological deficits. Extremities: Symmetric 5 x 5 power. Skin: No rashes, lesions or ulcers Psychiatry: Judgement and insight appear normal. Mood & affect appropriate.     Data Reviewed: I have personally reviewed following labs and imaging studies  CBC: Recent Labs  Lab 10/01/19 0051 10/02/19 0421  WBC 3.4* 13.9*  NEUTROABS 1.2*  --   HGB 12.3 11.3*  HCT 39.8 36.8  MCV 93.2 94.1  PLT 162 0000000   Basic Metabolic Panel: Recent Labs  Lab 10/01/19 0051 10/02/19 0421  NA 138 143  K 3.0* 3.2*  CL 101 101  CO2 30 32  GLUCOSE 108* 94  BUN 7* 7*  CREATININE 0.71 0.64  CALCIUM 9.0 8.7*  MG  --  1.5*   GFR: Estimated Creatinine Clearance: 88.5 mL/min (by C-G formula based on SCr of 0.64 mg/dL). Liver Function Tests: Recent Labs  Lab 10/01/19 0051  AST 38  ALT 30  ALKPHOS 63  BILITOT 0.3  PROT 7.3  ALBUMIN 3.2*   No results for input(s): LIPASE, AMYLASE in the last 168 hours. No results for input(s): AMMONIA in the last 168 hours. Coagulation Profile: No results for input(s): INR, PROTIME in the last 168 hours. Cardiac Enzymes: No results for input(s): CKTOTAL, CKMB, CKMBINDEX, TROPONINI in the last 168 hours. BNP (last 3 results) No results for input(s): PROBNP in the last 8760 hours. HbA1C: No results for input(s): HGBA1C in the last 72  hours. CBG: Recent Labs  Lab 10/01/19 1215 10/01/19 1719 10/01/19 2247 10/02/19 0726 10/02/19 1131  GLUCAP 138* 81 103* 93 153*   Lipid Profile: No results for input(s): CHOL, HDL, LDLCALC, TRIG, CHOLHDL, LDLDIRECT in the last 72 hours. Thyroid Function Tests: No results for input(s): TSH, T4TOTAL, FREET4, T3FREE, THYROIDAB in the last 72 hours. Anemia Panel: No results for input(s): VITAMINB12, FOLATE, FERRITIN, TIBC, IRON, RETICCTPCT in the last 72 hours. Sepsis Labs: Recent Labs  Lab 10/01/19 0051  LATICACIDVEN 1.9    Recent Results (from the past 240 hour(s))  Blood culture (routine x 2)     Status: None (Preliminary result)   Collection Time: 10/01/19 12:51 AM   Specimen: BLOOD  Result Value Ref Range Status   Specimen Description   Final    BLOOD RIGHT ANTECUBITAL Performed at [facility] [address], [place], [place] [address]    Special Requests   Final    BOTTLES DRAWN  AEROBIC AND ANAEROBIC Blood Culture adequate volume Performed at [facility] [address], [place], [place] [address]    Culture   Final    NO GROWTH 1 DAY Performed at [facility], [place] [address] [place], [place] [address]    Report Status PENDING  Incomplete  Blood culture (routine x 2)     Status: None (Preliminary result)   Collection Time: 10/01/19 12:52 AM   Specimen: BLOOD LEFT FOREARM  Result Value Ref Range Status   Specimen Description   Final    BLOOD LEFT FOREARM Performed at [facility], [place] [address] [place], [place] [address]    Special Requests   Final    BOTTLES DRAWN AEROBIC AND ANAEROBIC Blood Culture adequate volume Performed at [facility] [address] [place], [place] [address]    Culture   Final    NO GROWTH 1 DAY Performed at [facility], [place] [address] [place], [place] [address]    Report Status PENDING  Incomplete  Urine culture     Status: Abnormal   Collection  Time: 10/01/19 12:52 AM   Specimen: Urine, Clean Catch  Result Value Ref Range Status   Specimen Description   Final    URINE, CLEAN CATCH Performed at [facility], [place] [address] [place], [place] [address]    Special Requests   Final    NONE Performed at [facility], [place] [address] [place], [place] [address]    Culture (A)  Final    <10,000 COLONIES/mL INSIGNIFICANT GROWTH MULTIPLE SPECIES PRESENT, SUGGEST RECOLLECTION   Report Status 10/02/2019 FINAL  Final  SARS Coronavirus 2 by RT PCR (hospital order, performed in [facility] hospital lab) Nasopharyngeal Nasopharyngeal Swab     Status: None   Collection Time: 10/01/19 12:52 AM   Specimen: Nasopharyngeal Swab  Result Value Ref Range Status   SARS Coronavirus 2 NEGATIVE NEGATIVE Final    Comment: (NOTE) If result is NEGATIVE SARS-CoV-2 target nucleic acids are NOT DETECTED. The SARS-CoV-2 RNA is generally detectable in upper and lower  respiratory specimens during the acute phase of infection. The lowest  concentration of SARS-CoV-2 viral copies this assay can detect is 250  copies / mL. A negative result does not preclude SARS-CoV-2 infection  and should not be used as the sole basis for treatment or other  patient management decisions.  A negative result may occur with  improper specimen collection / handling, submission of specimen other  than nasopharyngeal swab, presence of viral mutation(s) within the  areas targeted by this assay, and inadequate number of viral copies  (<250 copies / mL). A negative result must be combined with clinical  observations, patient history, and epidemiological information. If result is POSITIVE SARS-CoV-2 target nucleic acids are DETECTED. The SARS-CoV-2 RNA is generally detectable in upper and lower  respiratory specimens dur ing the acute phase of infection.  Positive  results are indicative of active infection with SARS-CoV-2.  Clinical   correlation with patient history and other diagnostic information is  necessary to determine patient infection status.  Positive results do  not rule out bacterial infection or co-infection with other viruses. If result is PRESUMPTIVE POSTIVE SARS-CoV-2 nucleic acids MAY BE PRESENT.   A presumptive positive result was obtained on the submitted specimen  and confirmed on repeat testing.  While 2019 novel coronavirus  (SARS-CoV-2) nucleic acids may be present in the submitted sample  additional  confirmatory testing may be necessary for epidemiological  and / or clinical management purposes  to differentiate between  SARS-CoV-2 and other Sarbecovirus currently known to infect humans.  If clinically indicated additional testing with an alternate test  methodology [phone]) is advised. The SARS-CoV-2 RNA is generally  detectable in upper and lower respiratory sp ecimens during the acute  phase of infection. The expected result is Negative. Fact Sheet for Patients:  StrictlyIdeas.no Fact Sheet for Healthcare Providers: BankingDealers.co.za This test is not yet approved or cleared by the [place] FDA and has been authorized for detection and/or diagnosis of SARS-CoV-2 by FDA under an Emergency Use Authorization (EUA).  This EUA will remain in effect (meaning this test can be used) for the duration of the COVID-19 declaration under Section 564(b)(1) of the Act, 21 U.S.C. section 360bbb-3(b)(1), unless the authorization is terminated or revoked sooner. Performed at [facility], [place] [address] [place], [place] [address]   MRSA PCR Screening     Status: None   Collection Time: 10/01/19  4:26 PM   Specimen: Nasopharyngeal  Result Value Ref Range Status   MRSA by PCR NEGATIVE NEGATIVE Final    Comment:        The GeneXpert MRSA Assay (FDA approved for NASAL specimens only), is one component of a comprehensive MRSA  colonization surveillance program. It is not intended to diagnose MRSA infection nor to guide or monitor treatment for MRSA infections. Performed at [facility], [place] [address] [place], [place] [address]          Radiology Studies: Ct Angio Chest Pe W And/or Wo Contrast  Result Date: 10/01/2019 CLINICAL DATA:  Breast cancer and interstitial lung disease with new onset hypoxia. EXAM: CT ANGIOGRAPHY CHEST WITH CONTRAST TECHNIQUE: Multidetector CT imaging of the chest was performed using the standard protocol during bolus administration of intravenous contrast. Multiplanar CT image reconstructions and MIPs were obtained to evaluate the vascular anatomy. CONTRAST:  179mL OMNIPAQUE IOHEXOL 350 MG/ML SOLN COMPARISON:  CTA chest 10/05/2016 FINDINGS: Cardiovascular: --Pulmonary arteries: Contrast injection is sufficient to demonstrate satisfactory opacification of the pulmonary arteries to the segmental level, with attenuation of at least 200 HU at the main pulmonary artery. There is no pulmonary embolus. The main pulmonary artery is within normal limits for size. --Aorta: Limited opacification of the aorta due to bolus timing optimization for the pulmonary arteries. The course and caliber of the aorta are normal. Conventional 3 vessel aortic branching pattern. There is no aortic atherosclerosis. --Heart: Mild cardiomegaly. No pericardial effusion. Mediastinum/Nodes: No mediastinal adenopathy. The thyroid gland is normal. Normal course of the esophagus. Lungs/Pleura: There is mild pulmonary edema. Small right pleural effusion. Upper Abdomen: Contrast bolus timing is not optimized for evaluation of the abdominal organs. The visualized portions of the organs of the upper abdomen are normal. Musculoskeletal: No chest wall abnormality. No bony spinal canal stenosis. Review of the MIP images confirms the above findings. IMPRESSION: 1. No pulmonary embolus. 2. Cardiomegaly and mild pulmonary  edema. 3. Small right pleural effusion. Electronically Signed   By: Ulyses Jarred M.D.   On: 10/01/2019 03:55   Dg Chest Port 1 View  Result Date: 10/01/2019 CLINICAL DATA:  Shortness of breath and fever. On chemotherapy. EXAM: PORTABLE CHEST 1 VIEW COMPARISON:  Chest radiograph 09/23/2019 FINDINGS: There is a right chest wall power-injectable Port-A-Cath with tip at the cavoatrial junction via a right internal jugular vein approach. The heart size and mediastinal contours are within normal limits.  There is shallow lung inflation without focal consolidation. The visualized skeletal structures are unremarkable. IMPRESSION: No active cardiopulmonary disease. Electronically Signed   By: Ulyses Jarred M.D.   On: 10/01/2019 00:01        Scheduled Meds: . aspirin EC  81 mg Oral QHS  . atorvastatin  40 mg Oral q1800  . Chlorhexidine Gluconate Cloth  6 each Topical Daily  . clobetasol cream   Topical BID  . doxepin  20 mg Oral QHS  . enoxaparin (LOVENOX) injection  40 mg Subcutaneous Daily  . furosemide  40 mg Intravenous Daily  . glipiZIDE  5 mg Oral Q breakfast  . isosorbide mononitrate  60 mg Oral Daily  . [START ON 10/03/2019] l-methylfolate-B6-B12  1 tablet Oral BID WC  . metFORMIN  500 mg Oral BID WC  . metoprolol tartrate  50 mg Oral BID  . mometasone-formoterol  2 puff Inhalation BID  . montelukast  10 mg Oral QHS  . pantoprazole  80 mg Oral BID  . potassium chloride  20 mEq Oral Daily  . pregabalin  150 mg Oral TID  . sodium chloride flush  3 mL Intravenous Q12H  . sucralfate  1 g Oral TID WC & HS   Continuous Infusions: . sodium chloride    . aztreonam 2 g (10/02/19 1340)  . vancomycin 1,500 mg (10/02/19 [id number])     LOS: 1 day     Georgette Shell, MD [facility] If 7PM-7AM, please contact night-coverage [email or web address] Password [email or web address] 10/02/2019, 3:10 PM

## 2019-10-02 NOTE — TOC Initial Note (Signed)
Transition of Care [facility]) - Initial/Assessment Note    Patient Details  Name: Shelby Anderson MRN: [id number] Date of Birth: 08-31-1956  Transition of [facility]) CM/SW Contact:    Dessa Phi, RN Phone Number: 10/02/2019, 3:22 PM  Clinical Narrative:Spoke to patient about d/c plans-home w/spouse has rw,cane.CM referral for CHF screen-only 1 adm/35months-no appropriate for CHF protocal.[facility] ACO. No current CM needs.                   Expected Discharge Plan: Home/Self Care Barriers to Discharge: Continued Medical Work up   Patient Goals and CMS Choice        Expected Discharge Plan and Services Expected Discharge Plan: Home/Self Care   Discharge Planning Services: CM Consult   Living arrangements for the past 2 months: Single Family Home                                      Prior Living Arrangements/Services Living arrangements for the past 2 months: Single Family Home Lives with:: Spouse Patient language and need for interpreter reviewed:: Yes Do you feel safe going back to the place where you live?: Yes      Need for Family Participation in Patient Care: No (Comment) Care giver support system in place?: Yes (comment) Current home services: DME(cane, rw;[facility]) Criminal Activity/Legal Involvement Pertinent to Current Situation/Hospitalization: No - Comment as needed  Activities of Daily Living Home Assistive Devices/Equipment: Nebulizer, Oxygen, Cane (specify quad or straight), Eyeglasses, Dentures (specify type)(single point cane, upper denture) ADL Screening (condition at time of admission) Patient's cognitive ability adequate to safely complete daily activities?: Yes Is the patient deaf or have difficulty hearing?: No Does the patient have difficulty seeing, even when wearing glasses/contacts?: No Does the patient have difficulty concentrating, remembering, or making decisions?: No Patient able to express need for assistance with ADLs?: Yes Does the  patient have difficulty dressing or bathing?: No Independently performs ADLs?: Yes (appropriate for developmental age) Does the patient have difficulty walking or climbing stairs?: Yes(secondary to weakness and shortness of breath) Weakness of Legs: Both Weakness of Arms/Hands: None  Permission Sought/Granted Permission sought to share information with : Case Manager Permission granted to share information with : Yes, Verbal Permission Granted  Share Information with NAMEJuliann Pulse [facility] [id number]           Emotional Assessment Appearance:: Appears stated age Attitude/Demeanor/Rapport: Gracious Affect (typically observed): Accepting Orientation: : Oriented to Self, Oriented to Place, Oriented to  Time, Oriented to Situation Alcohol / Substance Use: Not Applicable Psych Involvement: No (comment)  Admission diagnosis:  Acute pulmonary edema (HCC) [J81.0] Hypoxia [R09.02] Fever, unspecified fever cause [R50.9] Patient Active Problem List   Diagnosis Date Noted  . Acute on chronic diastolic CHF (congestive heart failure) ([place]) 10/01/2019  . Neutropenia with fever ([place]) 10/01/2019  . Genetic testing 09/17/2019  . Family history of melanoma   . Family history of pancreatic cancer   . Malignant neoplasm of upper-outer quadrant of right breast in female, estrogen receptor negative ([place]) 09/07/2019  . Encounter for loop recorder at end of battery life 04/24/2018  . Esophageal stricture 07/01/2017  . Hyperlipidemia associated with type 2 diabetes mellitus ([place]) 05/20/2017  . Allergic rhinitis 03/11/2017  . Dilated cardiomyopathy ([place]) 02/07/2017  . 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 Morbid obesity due to excess calories ([place]) 09/26/2016  . S/P  left TKA 09/25/2016  . Increased endometrial stripe thickness 06/27/2016  . Intramural leiomyoma of uterus 06/27/2016  . Type 2 diabetes mellitus without complication, without long-term current use of insulin ([place])  12/06/2015  . Chronic respiratory failure ([place]) 09/15/2015  . Dyspnea 09/01/2015  . Hypertension associated with diabetes ([place]) 07/19/2015  . Arrhythmia 07/19/2015  . Orthostatic hypotension 07/19/2015  . GERD (gastroesophageal reflux disease) 07/19/2015  . Chronic back pain 07/19/2015  . Neuropathy 07/19/2015  . Symptomatic PVCs 11/02/2014  . Syncope 11/02/2014  . Chest pain 12/23/2013  . Disorder of cervix 03/10/2013  . Vaginal atrophy 03/10/2013  . OSA (obstructive sleep apnea) 07/31/2012  . Coronary artery disease 11/20/2011   PCP:  System, Pcp Not In Pharmacy:   [id number] [place], [employer] [address] [address] Phone: [phone] Fax: [phone]     Social Determinants of Health (SDOH) Interventions    Readmission Risk Interventions Readmission Risk Prevention Plan 10/02/2019  Transportation Screening Complete  PCP or Specialist Appt within 5-7 Days Not Complete  Not Complete comments no d/c yet-patient able to make own pcp appt  Home Care Screening Complete  Medication Review (RN CM) Complete  Some recent data might be hidden

## 2019-10-02 NOTE — Progress Notes (Addendum)
HEMATOLOGY-ONCOLOGY PROGRESS NOTE  SUBJECTIVE: The patient presented to the emergency room with fever and rash.  In the ER, her temperature was 100.5 and O2 sats were 86% on room air.  COVID-19 testing negative.  CT angiogram the chest was negative for PE but showed pulmonary edema.  The patient does have a diagnosis of CHF and was not on diuretics prior to admission.  Receiving Lasix and is diuresing well.  Reports ongoing shortness of breath with exertion.  Denies chest pain.  Echocardiogram performed yesterday showed LVEF 60 to 65%.  Reports that she had some nausea and vomiting for a couple of days following chemotherapy then had 1 good day then developed 3 days of diarrhea.  She used Imodium which was effective.  She is not currently having nausea, vomiting, diarrhea.  Reports rash and itching started after chemotherapy prior to G-CSF.  Oncology History  Malignant neoplasm of upper-outer quadrant of right breast in female, estrogen receptor negative ([place])  09/07/2019 Initial Diagnosis   Routine screening mammogram detected a 2.1cm mass in the left breast and no left axillary adenopathy. 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   09/24/2019 -  Chemotherapy   The patient had palonosetron (ALOXI) injection 0.25 mg, 0.25 mg, Intravenous,  Once, 1 of 6 cycles Administration: 0.25 mg (09/24/2019) pegfilgrastim (NEULASTA ONPRO KIT) injection 6 mg, 6 mg, Subcutaneous, Once, 1 of 6 cycles Administration: 6 mg (09/24/2019) cyclophosphamide (CYTOXAN) 1,100 mg in sodium chloride 0.9 % 250 mL chemo infusion, 500 mg/m2 = 1,100 mg (83.3 % of original dose 600 mg/m2), Intravenous,  Once, 1 of 6 cycles Dose modification: 500 mg/m2 (original dose 600 mg/m2, Cycle 1, Reason: Provider Judgment) Administration: 1,100 mg (09/24/2019) DOCEtaxel (TAXOTERE) 140 mg in sodium chloride 0.9 % 250 mL chemo infusion, 65 mg/m2 = 140 mg (86.7 % of original dose 75 mg/m2), Intravenous,  Once, 1 of 6  cycles Dose modification: 65 mg/m2 (original dose 75 mg/m2, Cycle 1, Reason: Provider Judgment) Administration: 140 mg (09/24/2019) fosaprepitant (EMEND) 150 mg in sodium chloride 0.9 % 145 mL IVPB, , Intravenous,  Once, 1 of 6 cycles Administration:  (09/24/2019)  for chemotherapy treatment.       REVIEW OF SYSTEMS:   Constitutional: Denies fevers and chills since admission Respiratory: Reports ongoing dyspnea on exertion, no cough Cardiovascular: Denies palpitation, chest discomfort Gastrointestinal: No current nausea, vomiting, diarrhea Skin: Reports pruritic rash to her back Lymphatics: Denies new lymphadenopathy or easy bruising Neurological:Denies numbness, tingling or new weaknesses Behavioral/Psych: Mood is stable, no new changes  Extremities: No lower extremity edema Breast: Reports intermittent sharp pain to her left breast which is consistent with the location of her known breast mass All other systems were reviewed with the patient and are negative.  I have reviewed the past medical history, past surgical history, social history and family history with the patient and they are unchanged from previous note.   PHYSICAL EXAMINATION: ECOG PERFORMANCE STATUS: 1 - Symptomatic but completely ambulatory  Vitals:   10/02/19 0722 10/02/19 0851  BP:  115/68  Pulse:  68  Resp:    Temp:    SpO2: 97%    Filed Weights   09/30/19 2258 10/02/19 0518  Weight: 225 lb (102.1 kg) 222 lb 7.1 oz (100.9 kg)    Intake/Output from previous day: 10/29 0701 - 10/30 0700 In: 602.1 [P.O.:300; IV Piggyback:302.1] Out: 1 [Urine:1]  GENERAL:alert, no distress and comfortable SKIN: Maculopapular rash noted to her back EYES:  normal, Conjunctiva are pink and non-injected, sclera clear OROPHARYNX:no exudate, no erythema and lips, buccal mucosa, and tongue normal  NECK: supple, thyroid normal size, non-tender, without nodularity LYMPH:  no palpable lymphadenopathy in the cervical, axillary  or inguinal LUNGS: She has some scattered expiratory wheezes HEART: regular rate & rhythm and no murmurs and no lower extremity edema ABDOMEN:abdomen soft, non-tender and normal bowel sounds Musculoskeletal:no cyanosis of digits and no clubbing  NEURO: alert & oriented x 3 with fluent speech, no focal motor/sensory deficits  LABORATORY DATA:  I have reviewed the data as listed CMP Latest Ref Rng & Units 10/02/2019 10/01/2019 09/24/2019  Glucose 70 - 99 mg/dL 94 108(H) 156(H)  BUN 8 - 23 mg/dL 7(L) 7(L) 10  Creatinine 0.44 - 1.00 mg/dL 0.64 0.71 0.72  Sodium 135 - 145 mmol/L 143 138 143  Potassium 3.5 - 5.1 mmol/L 3.2(L) 3.0(L) 4.2  Chloride 98 - 111 mmol/L 101 101 106  CO2 22 - 32 mmol/L 32 30 28  Calcium 8.9 - 10.3 mg/dL 8.7(L) 9.0 8.9  Total Protein 6.5 - 8.1 g/dL - 7.3 7.0  Total Bilirubin 0.3 - 1.2 mg/dL - 0.3 0.4  Alkaline Phos 38 - 126 U/L - 63 57  AST 15 - 41 U/L - 38 28  ALT 0 - 44 U/L - 30 30    Lab Results  Component Value Date   WBC 13.9 (H) 10/02/2019   HGB 11.3 (L) 10/02/2019   HCT 36.8 10/02/2019   MCV 94.1 10/02/2019   PLT 179 10/02/2019   NEUTROABS 1.2 (L) 10/01/2019    Ct Angio Chest Pe W And/or Wo Contrast  Result Date: 10/01/2019 CLINICAL DATA:  Breast cancer and interstitial lung disease with new onset hypoxia. EXAM: CT ANGIOGRAPHY CHEST WITH CONTRAST TECHNIQUE: Multidetector CT imaging of the chest was performed using the standard protocol during bolus administration of intravenous contrast. Multiplanar CT image reconstructions and MIPs were obtained to evaluate the vascular anatomy. CONTRAST:  143m OMNIPAQUE IOHEXOL 350 MG/ML SOLN COMPARISON:  CTA chest 10/05/2016 FINDINGS: Cardiovascular: --Pulmonary arteries: Contrast injection is sufficient to demonstrate satisfactory opacification of the pulmonary arteries to the segmental level, with attenuation of at least 200 HU at the main pulmonary artery. There is no pulmonary embolus. The main pulmonary artery  is within normal limits for size. --Aorta: Limited opacification of the aorta due to bolus timing optimization for the pulmonary arteries. The course and caliber of the aorta are normal. Conventional 3 vessel aortic branching pattern. There is no aortic atherosclerosis. --Heart: Mild cardiomegaly. No pericardial effusion. Mediastinum/Nodes: No mediastinal adenopathy. The thyroid gland is normal. Normal course of the esophagus. Lungs/Pleura: There is mild pulmonary edema. Small right pleural effusion. Upper Abdomen: Contrast bolus timing is not optimized for evaluation of the abdominal organs. The visualized portions of the organs of the upper abdomen are normal. Musculoskeletal: No chest wall abnormality. No bony spinal canal stenosis. Review of the MIP images confirms the above findings. IMPRESSION: 1. No pulmonary embolus. 2. Cardiomegaly and mild pulmonary edema. 3. Small right pleural effusion. Electronically Signed   By: KUlyses JarredM.D.   On: 10/01/2019 03:55   Dg Chest Port 1 View  Result Date: 10/01/2019 CLINICAL DATA:  Shortness of breath and fever. On chemotherapy. EXAM: PORTABLE CHEST 1 VIEW COMPARISON:  Chest radiograph 09/23/2019 FINDINGS: There is a right chest wall power-injectable Port-A-Cath with tip at the cavoatrial junction via a right internal jugular vein approach. The heart size and mediastinal contours are within normal limits.  There is shallow lung inflation without focal consolidation. The visualized skeletal structures are unremarkable. IMPRESSION: No active cardiopulmonary disease. Electronically Signed   By: Ulyses Jarred M.D.   On: 10/01/2019 00:01   Dg Chest Port 1 View  Result Date: 09/23/2019 CLINICAL DATA:  63 year old female status post Port-A-Cath placement. EXAM: PORTABLE CHEST 1 VIEW COMPARISON:  Chest radiographs 07/15/2018 and earlier. FINDINGS: Portable AP upright view at 1415 hours. Right chest IJ approach porta cath in place, power port and currently accessed.  Catheter tip is at the lower SVC level just below the carina. No adverse features. Lower lung volumes. Mild atelectasis. No pneumothorax. Stable cardiac size and mediastinal contours. Stable left chest cardiac event recorder. Visualized tracheal air column is within normal limits. Paucity of bowel gas in the upper abdomen. Degenerative changes at both shoulders. No acute osseous abnormality identified. IMPRESSION: 1. Right chest power port placed with no adverse features. 2. Lower lung volumes with mild atelectasis. Electronically Signed   By: Genevie Ann M.D.   On: 09/23/2019 14:27   Mm Clip Placement Left  Result Date: 09/02/2019 CLINICAL DATA:  Status post ultrasound-guided core needle biopsy of a 2.1 cm mass in the 1 o'clock position of the left breast. EXAM: DIAGNOSTIC LEFT MAMMOGRAM POST ULTRASOUND BIOPSY COMPARISON:  Previous exam(s). FINDINGS: Mammographic images were obtained following ultrasound guided biopsy of the recently demonstrated 2.1 cm mass in the 1 o'clock position of the left breast. These demonstrate a ribbon shaped biopsy marker clip in the biopsied mass. IMPRESSION: Appropriate clip deployment following left breast ultrasound-guided core needle biopsy. Final Assessment: Post Procedure Mammograms for Marker Placement Electronically Signed   By: Claudie Revering M.D.   On: 09/02/2019 11:41   [place] Lt Breast Bx W Loc Dev 1st Lesion Img Bx Spec US Guide  Addendum Date: 09/03/2019   ADDENDUM REPORT: 09/03/2019 13:38 ADDENDUM: Pathology revealed GRADE III INVASIVE DUCTAL CARCINOMA, DUCTAL CARCINOMA IN SITU of Left breast, 1 o'clock. This was found to be concordant by Dr. Claudie Revering. Pathology results were discussed with the patient by telephone. The patient reported doing well after the biopsy with tenderness at the site. Post biopsy instructions and care were reviewed and questions were answered. The patient was encouraged to call [facility] for any additional concerns.  The patient was referred to [facility] on September 09, 2019. Pathology results reported by Terie Purser, RN on 09/03/2019. Electronically Signed   By: Claudie Revering M.D.   On: 09/03/2019 13:38   Result Date: 09/03/2019 CLINICAL DATA:  2.1 cm mass in the 1 o'clock position of the left breast with recent imaging findings suspicious for malignancy. EXAM: ULTRASOUND GUIDED LEFT BREAST CORE NEEDLE BIOPSY COMPARISON:  Previous exam(s). FINDINGS: I met with the patient and we discussed the procedure of ultrasound-guided biopsy, including benefits and alternatives. We discussed the high likelihood of a successful procedure. We discussed the risks of the procedure, including infection, bleeding, tissue injury, clip migration, and inadequate sampling. Informed written consent was given. The usual time-out protocol was performed immediately prior to the procedure. Lesion quadrant: Upper outer quadrant Using sterile technique and 1% Lidocaine as local anesthetic, under direct ultrasound visualization, a 12 gauge spring-loaded device was used to perform biopsy of the recently demonstrated 2.1 cm mass in the 1 o'clock position of the left breast, 4 cm from the nipple, using a lateral approach. At the conclusion of the procedure ribbon shaped  tissue marker clip was deployed into the biopsy cavity. Follow up 2 view mammogram was performed and dictated separately. IMPRESSION: Ultrasound guided biopsy of the recently demonstrated 2.1 cm mass in the 1 o'clock position of the left breast. No apparent complications. Electronically Signed: By: Claudie Revering M.D. On: 09/02/2019 11:18    ASSESSMENT AND PLAN: 1.  Left breast cancer, ER/PR negative, HER-2 negative 2.  Acute on chronic diastolic CHF 3.  Febrile neutropenia, resolved 4.  Hypertension 5.  Diabetes mellitus 6.  Hypokalemia 7.  Rash likely due to Taxotere  -Unsure is CHF exacerbated by recent  Taxotere. Taxotere tends to cause peripheral edema. Continue medical management.  The patient is responding well to diuresis. -The patient had mild neutropenia on admission which has now resolved.  She is no longer febrile.  Blood cultures negative to date and urine culture with less than 10,000 colonies per mL.  Consider de-escalation of antibiotics if she remains afebrile. -We will start the patient on Benadryl 25 mg p.o. every 6 hours as needed for itching I have also prescribed clobetasol cream to be applied twice a day to her rash.   LOS: 1 day   Mikey Bussing, DNP, AGPCNP-BC, AOCNP 10/02/19   Attending Note  I personally saw the patient, reviewed the chart and examined the patient. The plan of care was discussed with the patient and the admitting team. I agree with the assessment and plan as documented above. Thank you very much for the consultation. Fever with pancytopenia: Resolved, discontinue IV antibiotics. Shortness of breath due to acute congestive heart failure: Unrelated to Taxotere.  Patient is still having shortness of breath and hypoxia when she walks around. We will plan on reducing the dosage of cycle 2 of chemotherapy. I will see her in my office for her next cycle of treatment on 10/15/2019.

## 2019-10-02 NOTE — Patient Outreach (Signed)
[facility]) Care Management  10/02/2019  ANNELIE ALBER [birth year] [id number]   RN Health Coach discipline closure. Patient has been admitted to hospital.  [place] [facility] [phone]

## 2019-10-02 NOTE — Progress Notes (Signed)
SATURATION QUALIFICATIONS:   Patient Saturations on Room Air at Rest = 88-89 %  Patient Saturations on Room Air while Ambulating = 86 %  Patient Saturations on 2 Liters of oxygen while Ambulating = 97 %  Please briefly explain why patient needs home oxygen: Pt ambulated from bed to restroom. Activity tolerated and pt experienced increased SOB.

## 2019-10-03 ENCOUNTER — Inpatient Hospital Stay (HOSPITAL_COMMUNITY): Payer: Medicare Other

## 2019-10-03 DIAGNOSIS — I5033 Acute on chronic diastolic (congestive) heart failure: Secondary | ICD-10-CM | POA: Diagnosis not present

## 2019-10-03 LAB — BASIC METABOLIC PANEL
Anion gap: 8 (ref 5–15)
BUN: 8 mg/dL (ref 8–23)
CO2: 34 mmol/L — ABNORMAL HIGH (ref 22–32)
Calcium: 8.6 mg/dL — ABNORMAL LOW (ref 8.9–10.3)
Chloride: 101 mmol/L (ref 98–111)
Creatinine, Ser: 0.66 mg/dL (ref 0.44–1.00)
GFR calc Af Amer: >60 mL/min (ref >60)
GFR calc non Af Amer: >60 mL/min (ref >60)
Glucose, Bld: 103 mg/dL — ABNORMAL HIGH (ref 70–99)
Potassium: 3.8 mmol/L (ref 3.5–5.1)
Sodium: 143 mmol/L (ref 135–145)

## 2019-10-03 LAB — GLUCOSE, CAPILLARY
Glucose-Capillary: 81 mg/dL (ref 70–99)
Glucose-Capillary: 173 mg/dL — ABNORMAL HIGH (ref 70–99)
Glucose-Capillary: 214 mg/dL — ABNORMAL HIGH (ref 70–99)
Glucose-Capillary: 252 mg/dL — ABNORMAL HIGH (ref 70–99)

## 2019-10-03 MED ORDER — METHYLPREDNISOLONE SODIUM SUCC 125 MG IJ SOLR
60.0000 mg | Freq: Four times a day (QID) | INTRAMUSCULAR | Status: DC
  Administered 2019-10-03 – 2019-10-04 (×6): 60 mg via INTRAVENOUS
  Filled 2019-10-03 (×6): qty 2

## 2019-10-03 MED ORDER — POTASSIUM CHLORIDE CRYS ER 10 MEQ PO TBCR
20.0000 meq | EXTENDED_RELEASE_TABLET | Freq: Every day | ORAL | Status: DC
  Administered 2019-10-03 – 2019-10-04 (×2): 20 meq via ORAL
  Filled 2019-10-03: qty 2

## 2019-10-03 MED ORDER — BENZONATATE 100 MG PO CAPS
200.0000 mg | ORAL_CAPSULE | Freq: Three times a day (TID) | ORAL | Status: DC
  Administered 2019-10-03 – 2019-10-04 (×5): 200 mg via ORAL
  Filled 2019-10-03 (×5): qty 2

## 2019-10-03 NOTE — Progress Notes (Signed)
SATURATION QUALIFICATIONS: (This note is used to comply with regulatory documentation for home oxygen)  Patient Saturations on Room Air at Rest = 93%  Patient Saturations on Room Air while Ambulating = 90-93% for 175' then 88-89% for 120' with drop to 87% at end

## 2019-10-03 NOTE — Evaluation (Signed)
Physical Therapy Evaluation Patient Details Name: Shelby Anderson MRN: [id number] DOB: Jan 26, 1956 Today's Date: 10/03/2019   History of Present Illness  63 y.o. female with medical history significant of A.fib not on anticoagulation, HTN, DM2, breast cancer Stage 2B, triple negative, diagnosed earlier this month, currently undergoing neoadjuvant chemotherapy admitted with hypoxia and CHF.  Clinical Impression  Pt admitted with above diagnosis.  Pt currently with functional limitations due to the deficits listed below (see PT Problem List). Pt will benefit from skilled PT to increase their independence and safety with mobility to allow discharge to the venue listed below. During gait, o2 90-93% for 1st half and then 88-89% for 2nd with pt stating that is normal for her. Instructed in pursed lip breathing.  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 Recommend 3-1 BSC.     Follow Up Recommendations No PT follow up    Equipment Recommendations  3in1 (PT)    Recommendations for Other Services       Precautions / Restrictions Precautions Precautions: Other (comment) Precaution Comments: monitor o2      Mobility  Bed Mobility Overal bed mobility: Independent                Transfers Overall transfer level: Modified independent               General transfer comment: stood from bed and toilet  Ambulation/Gait Ambulation/Gait assistance: Min guard Gait Distance (Feet): 295 Feet Assistive device: None Gait Pattern/deviations: Step-through pattern;Narrow base of support     General Gait Details: Amb on RA for 175' with o2 90-93% then 120'at 88-89% with pt talking and no c/o SOB. Near end of gait it dropped to 87% and she did report feeling of tightness.  Once sitting it increased back into 90's quickly and she reports tightness better.  Stairs            Wheelchair Mobility    Modified Rankin (Stroke  Patients Only)       Balance Overall balance assessment: No apparent balance deficits (not formally assessed)                                           Pertinent Vitals/Pain Pain Assessment: Faces Faces Pain Scale: Hurts whole lot Pain Location: B ribs with big breath Pain Descriptors / Indicators: Stabbing Pain Intervention(s): Patient requesting pain meds-RN notified    Home Living Family/patient expects to be discharged to:: Private residence Living Arrangements: Spouse/significant other;Other relatives Available Help at Discharge: Family Type of Home: House Home Access: Ramped entrance     [place]: Laundry or work area in [place]: Environmental consultant - 2 wheels;Cane - single point      Prior Function Level of Independence: Independent with assistive device(s)         Comments: Uses cane outside if uneven surface     Hand Dominance        Extremity/Trunk Assessment   Upper Extremity Assessment Upper Extremity Assessment: Overall WFL for tasks assessed    Lower Extremity Assessment Lower Extremity Assessment: Overall WFL for tasks assessed       Communication      Cognition Arousal/Alertness: Awake/alert Behavior During Therapy: WFL for tasks assessed/performed Overall Cognitive Status: Within Functional Limits for tasks assessed  General Comments      Exercises     Assessment/Plan    PT Assessment Patient needs continued PT services  PT Problem List Cardiopulmonary status limiting activity;Decreased activity tolerance;Decreased mobility       PT Treatment Interventions DME instruction;Gait training;Therapeutic exercise;Therapeutic activities;Functional mobility training    PT Goals (Current goals can be found in the Care Plan section)  Acute Rehab PT Goals Patient Stated Goal: go home PT Goal Formulation: With patient/family Time For Goal Achievement:  10/17/19 Potential to Achieve Goals: Good    Frequency Min 3X/week   Barriers to discharge        Co-evaluation               AM-PAC PT "6 Clicks" Mobility  Outcome Measure Help needed turning from your back to your side while in a flat bed without using bedrails?: None Help needed moving from lying on your back to sitting on the side of a flat bed without using bedrails?: None Help needed moving to and from a bed to a chair (including a wheelchair)?: None Help needed standing up from a chair using your arms (e.g., wheelchair or bedside chair)?: None Help needed to walk in hospital room?: A Little Help needed climbing 3-5 steps with a railing? : A Little 6 Click Score: 22    End of Session   Activity Tolerance: Patient tolerated treatment well;Other (comment)(de-sat at end of gait) Patient left: in chair;with call bell/phone within reach;with family/visitor present Nurse Communication: Mobility status;Patient requests pain meds PT Visit Diagnosis: Difficulty in walking, not elsewhere classified (R26.2)    Time: 1003-1030 PT Time Calculation (min) (ACUTE ONLY): 27 min   Charges:   PT Evaluation $PT Eval Low Complexity: 1 Low PT Treatments $Gait Training: 8-22 mins        Shelby Anderson, [place] Pager [phone] 10/03/2019   Shelby Anderson 10/03/2019, 10:43 AM

## 2019-10-03 NOTE — Progress Notes (Signed)
PROGRESS NOTE    Shelby Anderson  [id number] DOB: 1956/05/21 DOA: 09/30/2019 PCP: System, Pcp Not In   Brief Narrative:63 y.o.femalewith medical history significant ofA.fib not on anticoagulation, HTN, DM2, breast cancer Stage 2B, triple negative, diagnosed earlier this month, currently undergoing neoadjuvant chemotherapy (just got first round of taxotere/cytoxan on 10/22). Plan is for breast conserving surgery followed by adjuvant radiation therapy after chemo.  After chemo patient had N/V/D for a couple of days though this resolved and she was feeling better until last evening when she developed chills and Tm 102.9 at home. Did notice some SOB which she attributed to asthma. Dry cough, no hemotpysis no sputum production, no CP.  Does have a sparse rash, patches on trunk that developed after first chemo infusion. She was told this was a normal side effect. Took tylenol and presents to ED.   ED Course:Tm in ED is 100.5, O2 sat 86% on RA, satting well on O2 via [place].  COVID neg. UA neg.  CTA is neg for PE, does show mild pulmonary edema.  On further questioning, patientdoes report recently being diagnosed with CHF, not currently on lasix.  EDP ordered dose of lasix, hospitalist asked to admit.   Assessment & Plan:   Principal Problem:   Acute on chronic diastolic CHF (congestive heart failure) (HCC) Active Problems:   Hypertension associated with diabetes ([place])   Type 2 diabetes mellitus without complication, without long-term current use of insulin (HCC)   Hyperlipidemia associated with type 2 diabetes mellitus (HCC)   Malignant neoplasm of upper-outer quadrant of right breast in female, estrogen receptor negative (HCC)   Neutropenia with fever ([place])  Acute hypoxic respiratory failure multifactorial-combination of pulmonary edema with asthma exacerbation.  Patient was found to be in mild CHF with pulmonary edema noted as an chest CT.  CT showed no  evidence of pulmonary embolism.  Patient also has history of asthma and is on multiple inhalers and is on albuterol in nebulizer at home.  This morning she is wheezing more than yesterday.  I will start her on Solu-Medrol.  And continue albuterol nebulizer.  Keep her on the oxygen.  Her room air saturation after ambulation was 87%.  Patient did not have oxygen prior to admission to hospital.  She will need at least another 24 hours on IV steroids.  Add Best boy.  Mild diastolic CHF continue Lasix 40 mg daily she is still only negative by 451 cc.  Echo normal ejection fraction.  Asthma exacerbation continue Solu-Medrol patient is actively wheezing continue albuterol nebulizer and her home regime of Flonase, Symbicort, Singulair  Fever with rash after chemo with mild neutropenia has resolved she was initially on Vanco and aztreonam.  Will DC antibiotics today.  Cultures have been negative.  Hypertension blood pressure running soft 103/68.  Hold Imdur metoprolol she takes at home.  Type 2 diabetes on Metformin and glipizide blood sugar anywhere from 81-1 53.  Rash after chemo on clobetasol  Hypokalemia and hypomagnesemia replete and recheck.  Hyperlipidemia continue statin.  Estimated body mass index is 35.98 kg/m as calculated from the following:   Height as of this encounter: 5\' 6"  (1.676 m).   Weight as of this encounter: 101.1 kg.  DVT prophylaxis: Lovenox  code Status: Full code Family Communication: None Disposition Plan: Pending clinical improvement   Consultants:   Oncology  Procedures: None Antimicrobials:  None  Subjective: Patient resting in bed feels better but not yet back to her baseline continues to have  wheezing and shortness of breath upon walking and hypoxic on walking denies any chest pain  Objective: Vitals:   10/02/19 2104 10/03/19 0546 10/03/19 0649 10/03/19 0744  BP: 132/73 103/68    Pulse: 77 60    Resp: 18 16    Temp: 99 F (37.2 C) 97.8  F (36.6 C)    TempSrc: Oral Oral    SpO2: 99% 100%  97%  Weight:   101.1 kg   Height:        Intake/Output Summary (Last 24 hours) at 10/03/2019 1052 Last data filed at 10/03/2019 0600 Gross per 24 hour  Intake 877.21 ml  Output 400 ml  Net 477.21 ml   Filed Weights   09/30/19 2258 10/02/19 0518 10/03/19 0649  Weight: 102.1 kg 100.9 kg 101.1 kg    Examination:  General exam: Appears calm and comfortable  Respiratory system: Bilateral diffuse wheezing to auscultation. Respiratory effort normal. Cardiovascular system: S1 & S2 heard, RRR. No JVD, murmurs, rubs, gallops or clicks. No pedal edema. Gastrointestinal system: Abdomen is nondistended, soft and nontender. No organomegaly or masses felt. Normal bowel sounds heard. Central nervous system: Alert and oriented. No focal neurological deficits. Extremities: Trace edema  skin: No rashes, lesions or ulcers Psychiatry: Judgement and insight appear normal. Mood & affect appropriate.     Data Reviewed: I have personally reviewed following labs and imaging studies  CBC: Recent Labs  Lab 10/01/19 0051 10/02/19 0421  WBC 3.4* 13.9*  NEUTROABS 1.2*  --   HGB 12.3 11.3*  HCT 39.8 36.8  MCV 93.2 94.1  PLT 162 0000000   Basic Metabolic Panel: Recent Labs  Lab 10/01/19 0051 10/02/19 0421 10/03/19 0507  NA 138 143 143  K 3.0* 3.2* 3.8  CL 101 101 101  CO2 30 32 34*  GLUCOSE 108* 94 103*  BUN 7* 7* 8  CREATININE 0.71 0.64 0.66  CALCIUM 9.0 8.7* 8.6*  MG  --  1.5*  --    GFR: Estimated Creatinine Clearance: 88.6 mL/min (by C-G formula based on SCr of 0.66 mg/dL). Liver Function Tests: Recent Labs  Lab 10/01/19 0051  AST 38  ALT 30  ALKPHOS 63  BILITOT 0.3  PROT 7.3  ALBUMIN 3.2*   No results for input(s): LIPASE, AMYLASE in the last 168 hours. No results for input(s): AMMONIA in the last 168 hours. Coagulation Profile: No results for input(s): INR, PROTIME in the last 168 hours. Cardiac Enzymes: No results  for input(s): CKTOTAL, CKMB, CKMBINDEX, TROPONINI in the last 168 hours. BNP (last 3 results) No results for input(s): PROBNP in the last 8760 hours. HbA1C: No results for input(s): HGBA1C in the last 72 hours. CBG: Recent Labs  Lab 10/02/19 0726 10/02/19 1131 10/02/19 1632 10/02/19 2210 10/03/19 0731  GLUCAP 93 153* 120* 144* 81   Lipid Profile: No results for input(s): CHOL, HDL, LDLCALC, TRIG, CHOLHDL, LDLDIRECT in the last 72 hours. Thyroid Function Tests: No results for input(s): TSH, T4TOTAL, FREET4, T3FREE, THYROIDAB in the last 72 hours. Anemia Panel: No results for input(s): VITAMINB12, FOLATE, FERRITIN, TIBC, IRON, RETICCTPCT in the last 72 hours. Sepsis Labs: Recent Labs  Lab 10/01/19 0051  LATICACIDVEN 1.9    Recent Results (from the past 240 hour(s))  Blood culture (routine x 2)     Status: None (Preliminary result)   Collection Time: 10/01/19 12:51 AM   Specimen: BLOOD  Result Value Ref Range Status   Specimen Description   Final    BLOOD RIGHT ANTECUBITAL Performed  at [facility], [place] [address], [place], [place] [address]    Special Requests   Final    BOTTLES DRAWN AEROBIC AND ANAEROBIC Blood Culture adequate volume Performed at [facility] [address] [place], [place] [address]    Culture   Final    NO GROWTH 2 DAYS Performed at [facility] [address] [place], [place] [address]    Report Status PENDING  Incomplete  Blood culture (routine x 2)     Status: None (Preliminary result)   Collection Time: 10/01/19 12:52 AM   Specimen: BLOOD LEFT FOREARM  Result Value Ref Range Status   Specimen Description   Final    BLOOD LEFT FOREARM Performed at [facility], [place] [address], [place], [place] [address]    Special Requests   Final    BOTTLES DRAWN AEROBIC AND ANAEROBIC Blood Culture adequate volume Performed at [facility] [address] [place], [place]  [address]    Culture   Final    NO GROWTH 2 DAYS Performed at [facility] [address] [place], [place] [address]    Report Status PENDING  Incomplete  Urine culture     Status: Abnormal   Collection Time: 10/01/19 12:52 AM   Specimen: Urine, Clean Catch  Result Value Ref Range Status   Specimen Description   Final    URINE, CLEAN CATCH Performed at [facility], [place] [address] [place], [place] [address]    Special Requests   Final    NONE Performed at [facility], [place] [address], [place],  [address]    Culture (A)  Final    <10,000 COLONIES/mL INSIGNIFICANT GROWTH MULTIPLE SPECIES PRESENT, SUGGEST RECOLLECTION   Report Status 10/02/2019 FINAL  Final  SARS Coronavirus 2 by RT PCR (hospital order, performed in [facility] hospital lab) Nasopharyngeal Nasopharyngeal Swab     Status: None   Collection Time: 10/01/19 12:52 AM   Specimen: Nasopharyngeal Swab  Result Value Ref Range Status   SARS Coronavirus 2 NEGATIVE NEGATIVE Final    Comment: (NOTE) If result is NEGATIVE SARS-CoV-2 target nucleic acids are NOT DETECTED. The SARS-CoV-2 RNA is generally detectable in upper and lower  respiratory specimens during the acute phase of infection. The lowest  concentration of SARS-CoV-2 viral copies this assay can detect is 250  copies / mL. A negative result does not preclude SARS-CoV-2 infection  and should not be used as the sole basis for treatment or other  patient management decisions.  A negative result may occur with  improper specimen collection / handling, submission of specimen other  than nasopharyngeal swab, presence of viral mutation(s) within the  areas targeted by this assay, and inadequate number of viral copies  (<250 copies / mL). A negative result must be combined with clinical  observations, patient history, and epidemiological information. If result is POSITIVE SARS-CoV-2 target nucleic acids are DETECTED. The  SARS-CoV-2 RNA is generally detectable in upper and lower  respiratory specimens dur ing the acute phase of infection.  Positive  results are indicative of active infection with SARS-CoV-2.  Clinical  correlation with patient history and other diagnostic information is  necessary to determine patient infection status.  Positive results do  not rule out bacterial infection or co-infection with other viruses. If result is PRESUMPTIVE POSTIVE SARS-CoV-2 nucleic acids MAY BE PRESENT.   A presumptive positive result was obtained on the submitted  specimen  and confirmed on repeat testing.  While 2019 novel coronavirus  (SARS-CoV-2) nucleic acids may be present in the submitted sample  additional confirmatory testing may be necessary for epidemiological  and / or clinical management purposes  to differentiate between  SARS-CoV-2 and other Sarbecovirus currently known to infect humans.  If clinically indicated additional testing with an alternate test  methodology [phone]) is advised. The SARS-CoV-2 RNA is generally  detectable in upper and lower respiratory sp ecimens during the acute  phase of infection. The expected result is Negative. Fact Sheet for Patients:  StrictlyIdeas.no Fact Sheet for Healthcare Providers: BankingDealers.co.za This test is not yet approved or cleared by the [place] FDA and has been authorized for detection and/or diagnosis of SARS-CoV-2 by FDA under an Emergency Use Authorization (EUA).  This EUA will remain in effect (meaning this test can be used) for the duration of the COVID-19 declaration under Section 564(b)(1) of the Act, 21 U.S.C. section 360bbb-3(b)(1), unless the authorization is terminated or revoked sooner. Performed at [facility], [place] [address] [place], [place] [address]   MRSA PCR Screening     Status: None   Collection Time: 10/01/19  4:26 PM   Specimen: Nasopharyngeal   Result Value Ref Range Status   MRSA by PCR NEGATIVE NEGATIVE Final    Comment:        The GeneXpert MRSA Assay (FDA approved for NASAL specimens only), is one component of a comprehensive MRSA colonization surveillance program. It is not intended to diagnose MRSA infection nor to guide or monitor treatment for MRSA infections. Performed at [facility], [place] [address] [place], [place] [address]          Radiology Studies: Dg Chest 1 View  Result Date: 10/03/2019 CLINICAL DATA:  Short of breath.  Cough. EXAM: CHEST  1 VIEW COMPARISON:  09/30/2019 and older exams. FINDINGS: Cardiac silhouette mildly enlarged.  No mediastinal or hilar masses. Lung base opacities noted on previous exams have improved consistent improved atelectasis. Lungs otherwise clear. No pleural effusion or pneumothorax. Stable right anterior chest wall Port-A-Cath. IMPRESSION: 1. No acute cardiopulmonary disease. Electronically Signed   By: Lajean Manes M.D.   On: 10/03/2019 10:01        Scheduled Meds: . aspirin EC  81 mg Oral QHS  . atorvastatin  40 mg Oral q1800  . benzonatate  200 mg Oral TID  . Chlorhexidine Gluconate Cloth  6 each Topical Daily  . clobetasol cream   Topical BID  . doxepin  20 mg Oral QHS  . enoxaparin (LOVENOX) injection  40 mg Subcutaneous Daily  . furosemide  40 mg Intravenous Daily  . glipiZIDE  5 mg Oral Q breakfast  . isosorbide mononitrate  15 mg Oral Daily  . l-methylfolate-B6-B12  1 tablet Oral BID WC  . metFORMIN  500 mg Oral BID WC  . methylPREDNISolone (SOLU-MEDROL) injection  60 mg Intravenous Q6H  . metoprolol tartrate  12.5 mg Oral BID  . mometasone-formoterol  2 puff Inhalation BID  . montelukast  10 mg Oral QHS  . pantoprazole  80 mg Oral BID  . potassium chloride  20 mEq Oral Daily  . pregabalin  150 mg Oral TID  . sodium chloride flush  3 mL Intravenous Q12H  . sucralfate  1 g Oral TID WC & HS   Continuous Infusions: . sodium  chloride       LOS: 2 days     Georgette Shell,  MD [facility]  If 7PM-7AM, please contact night-coverage [email or web address] Password [email or web address] 10/03/2019, 10:52 AM

## 2019-10-04 LAB — BASIC METABOLIC PANEL
Anion gap: 10 (ref 5–15)
BUN: 11 mg/dL (ref 8–23)
CO2: 31 mmol/L (ref 22–32)
Calcium: 9.5 mg/dL (ref 8.9–10.3)
Chloride: 101 mmol/L (ref 98–111)
Creatinine, Ser: 0.59 mg/dL (ref 0.44–1.00)
GFR calc Af Amer: >60 mL/min (ref >60)
GFR calc non Af Amer: >60 mL/min (ref >60)
Glucose, Bld: 197 mg/dL — ABNORMAL HIGH (ref 70–99)
Potassium: 4 mmol/L (ref 3.5–5.1)
Sodium: 142 mmol/L (ref 135–145)

## 2019-10-04 LAB — GLUCOSE, CAPILLARY
Glucose-Capillary: 144 mg/dL — ABNORMAL HIGH (ref 70–99)
Glucose-Capillary: 295 mg/dL — ABNORMAL HIGH (ref 70–99)
Glucose-Capillary: 333 mg/dL — ABNORMAL HIGH (ref 70–99)

## 2019-10-04 MED ORDER — PREDNISONE 10 MG PO TABS: ORAL_TABLET | ORAL | 0 refills | Status: DC

## 2019-10-04 MED ORDER — FUROSEMIDE 20 MG PO TABS: 20.0000 mg | ORAL_TABLET | Freq: Every day | ORAL | 1 refills | Status: DC

## 2019-10-04 MED ORDER — CLOBETASOL PROPIONATE 0.05 % EX CREA: TOPICAL_CREAM | Freq: Two times a day (BID) | CUTANEOUS | 0 refills | Status: DC

## 2019-10-04 NOTE — Discharge Instructions (Signed)
Please note that I have stopped your Imdur and Lopressor due to soft or low blood pressure.  Please check your blood pressure daily at home write it down and take it to your PCP to consider restarting Imdur and Lopressor.  If your systolic blood pressure goes above 140 please go ahead and restart Imdur and Lopressor and let your PCP know.

## 2019-10-04 NOTE — Discharge Summary (Signed)
Physician Discharge Summary  Shelby Anderson [id number] DOB: [birth year] DOA: 09/30/2019  PCP: System, Pcp Not In  Admit date: 09/30/2019 Discharge date: 10/04/2019  Admitted From: Home  disposition: Home  Recommendations for Outpatient Follow-up:  1. Follow up with PCP in 1-2 weeks 2. Please obtain BMP/CBC in one week 3. Follow-up with oncology Home Health: None  equipment/Devices none Discharge Condition stable and improved CODE STATUS full code  diet recommendation: Cardiac carb modified diet Brief/Interim Summary:63 y.o.femalewith medical history significant ofA.fib not on anticoagulation, HTN, DM2, breast cancer Stage 2B, triple negative, diagnosed earlier this month, currently undergoing neoadjuvant chemotherapy (just got first round of taxotere/cytoxan on 10/22). Plan is for breast conserving surgery followed by adjuvant radiation therapy after chemo.  After chemo patient had N/V/D for a couple of days though this resolved and she was feeling better until last evening when she developed chills and Tm 102.9 at home. Did notice some SOB which she attributed to asthma. Dry cough, no hemotpysis no sputum production, no CP.  Does have a sparse rash, patches on trunk that developed after first chemo infusion. She was told this was a normal side effect. Took tylenol and presents to ED.   ED Course:Tm in ED is 100.5, O2 sat 86% on RA, satting well on O2 via [place].  COVID neg. UA neg.  CTA is neg for PE, does show mild pulmonary edema.  On further questioning, patientdoes report recently being diagnosed with CHF, not currently on lasix.  EDP ordered dose of lasix, hospitalist asked to admit.    Discharge Diagnoses:  Principal Problem:   Acute on chronic diastolic CHF (congestive heart failure) (HCC) Active Problems:   Hypertension associated with diabetes ([place])   Type 2 diabetes mellitus without complication, without long-term current use of insulin  (HCC)   Hyperlipidemia associated with type 2 diabetes mellitus (HCC)   Malignant neoplasm of upper-outer quadrant of right breast in female, estrogen receptor negative (HCC)   Neutropenia with fever ([place])   #1 acute hypoxic respiratory failure secondary to diastolic CHF exacerbation and asthma exacerbation.  She was treated with IV Lasix, IV steroids and nebulizer treatments.  She was hypoxic at 86% on room air upon admission to the hospital.  On the day of discharge her saturation was above 92% on room air with ambulation.  Her chest CT showed pulmonary edema.  Echo showed ejection fraction 60 to 65%. She will be discharged on a prednisone taper and Lasix 20 mg daily with potassium replacement.  #2 fever with rash after chemotherapy with mild neutropenia she received Vanco and aztreonam on admission to the hospital this was stopped later as all the cultures remain negative and she was afebrile.  Covid negative.  She was started on clobetasol cream by oncology.  #3 breast cancer follow-up with oncology for the next treatment.  #4 hypertension her blood pressure was very soft during the hospital stay.  She was on Imdur 60 mg daily and Lopressor at home this was on hold during the hospital stay.  Still at the time of discharge her blood pressure is soft so this has been stopped on DC.  I have advised the patient to check her blood pressure daily at home and write it down and follow-up with PCP to restart these medications if needed.  I have also advised her if her blood pressure goes above [id number] systolic she may restart his medications at home by herself.  #5 type 2 diabetes continue Metformin and glipizide  #6  hypokalemia and hypomagnesemia repleted  #7 hyperlipidemia continue Lipitor.  Estimated body mass index is 35.75 kg/m as calculated from the following:   Height as of this encounter: 5\' 6"  (1.676 m).   Weight as of this encounter: 100.5 kg.  Discharge Instructions  Discharge  Instructions    Call MD for:  difficulty breathing, headache or visual disturbances   Complete by: As directed    Call MD for:  persistant nausea and vomiting   Complete by: As directed    Call MD for:  severe uncontrolled pain   Complete by: As directed    Call MD for:  temperature >100.4   Complete by: As directed    Diet - low sodium heart healthy   Complete by: As directed    Increase activity slowly   Complete by: As directed      Allergies as of 10/04/2019      Reactions   Lodine [etodolac] Anaphylaxis, Hives, Swelling   Oxycontin [oxycodone Hcl] Anaphylaxis   hives, trouble breathing, tongue swelling (Only Oxycontin) Tolerates plain oxycodone.   Penicillins Anaphylaxis   Told by a surgeon never to take it again. Has patient had a PCN reaction causing immediate rash, facial/tongue/throat swelling, SOB or lightheadedness with hypotension: Yes Has patient had a PCN reaction causing severe rash involving mucus membranes or skin necrosis: Unknown Has patient had a PCN reaction that required hospitalization: No Has patient had a PCN reaction occurring within the last 10 years: No If all of the above answers are "NO", then may proceed with Cephalosporin use.   Aspirin Other (See Comments)   High-dose caused GI Bleeds   Darvocet [propoxyphene N-acetaminophen] Hives   Nitroglycerin    IV-BP drops dramatically   Tramadol Hives, Itching   Ultram [tramadol Hcl] Hives   Valium Other (See Comments)   Circulation problems. "Legs turned black".      Medication List    STOP taking these medications   isosorbide mononitrate 60 MG 24 hr tablet Commonly known as: IMDUR   metoprolol tartrate 50 MG tablet Commonly known as: LOPRESSOR     TAKE these medications   albuterol (2.5 MG/3ML) 0.083% nebulizer solution Commonly known as: PROVENTIL Take 3 mLs (2.5 mg total) by nebulization every 6 (six) hours as needed for wheezing or shortness of breath (J45.40).   albuterol 108 (90 Base)  MCG/ACT inhaler Commonly known as: VENTOLIN HFA Inhale 2 puffs into the lungs every 6 (six) hours as needed for wheezing or shortness of breath.   aspirin EC 81 MG tablet Take 81 mg at bedtime by mouth.   atorvastatin 40 MG tablet Commonly known as: LIPITOR TAKE 1 TABLET BY MOUTH EVERY DAY   budesonide-formoterol 80-4.5 MCG/ACT inhaler Commonly known as: Symbicort INHALE 2 PUFFS INTO THE LUNGS EVERY MORNING AND ANOTHER 2 PUFFS 12 HOURS LATER What changed:   how much to take  how to take this  when to take this  additional instructions   CINNAMON PO Take 1,000 mg 2 (two) times daily by mouth.   clobetasol cream 0.05 % Commonly known as: TEMOVATE Apply topically 2 (two) times daily.   Deplin 7.5 MG Tabs Take 7.5 mg by mouth daily with breakfast.   doxepin 10 MG capsule Commonly known as: SINEQUAN Take 20 mg by mouth at bedtime.   esomeprazole 40 MG capsule Commonly known as: NexIUM Take 1 capsule (40 mg total) by mouth 2 (two) times daily.   fluticasone 50 MCG/ACT nasal spray Commonly known as: [place]  2 sprays at bedtime as needed into both nostrils for allergies or rhinitis.   furosemide 20 MG tablet Commonly known as: Lasix Take 1 tablet (20 mg total) by mouth daily.   glipiZIDE 5 MG 24 hr tablet Commonly known as: GLUCOTROL XL TAKE 1 TABLET BY MOUTH EVERY DAY WITH BREAKFAST What changed: See the new instructions.   GLUCOS-CHONDROIT-MSM COMPLEX PO Take 1 tablet 3 (three) times daily by mouth.   HYDROcodone-acetaminophen 10-325 MG tablet Commonly known as: NORCO Take 1 tablet by mouth every 6 (six) hours as needed. What changed: reasons to take this   lidocaine-prilocaine cream Commonly known as: EMLA Apply to affected area once What changed:   how much to take  how to take this  when to take this  reasons to take this  additional instructions   LORazepam 0.5 MG tablet Commonly known as: Ativan Take 1 tablet (0.5 mg total) by mouth  at bedtime as needed for sleep.   Lyrica 150 MG capsule Generic drug: pregabalin Take 150 mg by mouth 3 (three) times daily.   metFORMIN 500 MG tablet Commonly known as: GLUCOPHAGE TAKE 1 TABLET (500 MG TOTAL) BY MOUTH 2 (TWO) TIMES DAILY WITH A MEAL.   mometasone 0.1 % cream Commonly known as: ELOCON Apply 1 application topically daily as needed (irritation).   montelukast 10 MG tablet Commonly known as: SINGULAIR TAKE 1 TABLET BY MOUTH EVERYDAY AT BEDTIME What changed: See the new instructions.   multivitamin with minerals Tabs tablet Take 1 tablet by mouth daily.   nitroGLYCERIN 0.4 MG SL tablet Commonly known as: NITROSTAT Place 0.4 mg under the tongue every 5 (five) minutes as needed for chest pain.   ondansetron 8 MG tablet Commonly known as: Zofran Take 1 tablet (8 mg total) by mouth 2 (two) times daily as needed for refractory nausea / vomiting. Start on day 3 after chemo. What changed: Another medication with the same name was removed. Continue taking this medication, and follow the directions you see here.   Oxycodone HCl 20 MG Tabs Take 20 mg every 4 (four) hours as needed by mouth (pain).   OXYGEN Inhale 2 L into the lungs at bedtime as needed (shortness of breath).   potassium chloride 10 MEQ tablet Commonly known as: KLOR-CON TAKE 2 TABLETS BY MOUTH EVERY DAY   predniSONE 10 MG tablet Commonly known as: DELTASONE Take 4 tablets daily for 4 days then 3 tablets daily for 4 days then 2 tablets daily for 4 days then 1 tablet daily till done.   prochlorperazine 10 MG tablet Commonly known as: COMPAZINE Take 1 tablet (10 mg total) by mouth every 6 (six) hours as needed (Nausea or vomiting).   promethazine 25 MG tablet Commonly known as: PHENERGAN Take 1 tablet (25 mg total) by mouth every 6 (six) hours as needed for nausea or vomiting.   sucralfate 1 g tablet Commonly known as: Carafate Take 1 tablet (1 g total) by mouth 4 (four) times daily -  with  meals and at bedtime. Before taking slowly dissolve tablet in 1 Tablespoon of distilled water for about 15 minutes to creat a slurry.   tiZANidine 4 MG tablet Commonly known as: ZANAFLEX Take 4 mg by mouth every 8 (eight) hours as needed for muscle spasms.   vitamin B-12 500 MCG tablet Commonly known as: CYANOCOBALAMIN Take 500 mcg by mouth daily.      Follow-up Information    Nicholas Lose, MD Follow up.   Specialty: Hematology and  Oncology Contact information: [place] [id number] [phone]          Allergies  Allergen Reactions  . Lodine [Etodolac] Anaphylaxis, Hives and Swelling  . Oxycontin [Oxycodone Hcl] Anaphylaxis    hives, trouble breathing, tongue swelling (Only Oxycontin) Tolerates plain oxycodone.  Marland Kitchen Penicillins Anaphylaxis    Told by a surgeon never to take it again. Has patient had a PCN reaction causing immediate rash, facial/tongue/throat swelling, SOB or lightheadedness with hypotension: Yes Has patient had a PCN reaction causing severe rash involving mucus membranes or skin necrosis: Unknown Has patient had a PCN reaction that required hospitalization: No Has patient had a PCN reaction occurring within the last 10 years: No If all of the above answers are "NO", then may proceed with Cephalosporin use.  . Aspirin Other (See Comments)    High-dose caused GI Bleeds  . Darvocet [Propoxyphene N-Acetaminophen] Hives  . Nitroglycerin     IV-BP drops dramatically  . Tramadol Hives and Itching  . Ultram [Tramadol Hcl] Hives  . Valium Other (See Comments)    Circulation problems. "Legs turned black".    Consultations: Oncology  Procedures/Studies: Dg Chest 1 View  Result Date: 10/03/2019 CLINICAL DATA:  Short of breath.  Cough. EXAM: CHEST  1 VIEW COMPARISON:  09/30/2019 and older exams. FINDINGS: Cardiac silhouette mildly enlarged.  No mediastinal or hilar masses. Lung base opacities noted on previous exams have  improved consistent improved atelectasis. Lungs otherwise clear. No pleural effusion or pneumothorax. Stable right anterior chest wall Port-A-Cath. IMPRESSION: 1. No acute cardiopulmonary disease. Electronically Signed   By: Lajean Manes M.D.   On: 10/03/2019 10:01   Ct Angio Chest Pe W And/or Wo Contrast  Result Date: 10/01/2019 CLINICAL DATA:  Breast cancer and interstitial lung disease with new onset hypoxia. EXAM: CT ANGIOGRAPHY CHEST WITH CONTRAST TECHNIQUE: Multidetector CT imaging of the chest was performed using the standard protocol during bolus administration of intravenous contrast. Multiplanar CT image reconstructions and MIPs were obtained to evaluate the vascular anatomy. CONTRAST:  14mL OMNIPAQUE IOHEXOL 350 MG/ML SOLN COMPARISON:  CTA chest 10/05/2016 FINDINGS: Cardiovascular: --Pulmonary arteries: Contrast injection is sufficient to demonstrate satisfactory opacification of the pulmonary arteries to the segmental level, with attenuation of at least 200 HU at the main pulmonary artery. There is no pulmonary embolus. The main pulmonary artery is within normal limits for size. --Aorta: Limited opacification of the aorta due to bolus timing optimization for the pulmonary arteries. The course and caliber of the aorta are normal. Conventional 3 vessel aortic branching pattern. There is no aortic atherosclerosis. --Heart: Mild cardiomegaly. No pericardial effusion. Mediastinum/Nodes: No mediastinal adenopathy. The thyroid gland is normal. Normal course of the esophagus. Lungs/Pleura: There is mild pulmonary edema. Small right pleural effusion. Upper Abdomen: Contrast bolus timing is not optimized for evaluation of the abdominal organs. The visualized portions of the organs of the upper abdomen are normal. Musculoskeletal: No chest wall abnormality. No bony spinal canal stenosis. Review of the MIP images confirms the above findings. IMPRESSION: 1. No pulmonary embolus. 2. Cardiomegaly and mild  pulmonary edema. 3. Small right pleural effusion. Electronically Signed   By: Ulyses Jarred M.D.   On: 10/01/2019 03:55   Dg Chest Port 1 View  Result Date: 10/01/2019 CLINICAL DATA:  Shortness of breath and fever. On chemotherapy. EXAM: PORTABLE CHEST 1 VIEW COMPARISON:  Chest radiograph 09/23/2019 FINDINGS: There is a right chest wall power-injectable Port-A-Cath with tip at the cavoatrial junction via a right internal  jugular vein approach. The heart size and mediastinal contours are within normal limits. There is shallow lung inflation without focal consolidation. The visualized skeletal structures are unremarkable. IMPRESSION: No active cardiopulmonary disease. Electronically Signed   By: Ulyses Jarred M.D.   On: 10/01/2019 00:01   Dg Chest Port 1 View  Result Date: 09/23/2019 CLINICAL DATA:  62 year old female status post Port-A-Cath placement. EXAM: PORTABLE CHEST 1 VIEW COMPARISON:  Chest radiographs 07/15/2018 and earlier. FINDINGS: Portable AP upright view at 1415 hours. Right chest IJ approach porta cath in place, power port and currently accessed. Catheter tip is at the lower SVC level just below the carina. No adverse features. Lower lung volumes. Mild atelectasis. No pneumothorax. Stable cardiac size and mediastinal contours. Stable left chest cardiac event recorder. Visualized tracheal air column is within normal limits. Paucity of bowel gas in the upper abdomen. Degenerative changes at both shoulders. No acute osseous abnormality identified. IMPRESSION: 1. Right chest power port placed with no adverse features. 2. Lower lung volumes with mild atelectasis. Electronically Signed   By: Genevie Ann M.D.   On: 09/23/2019 14:27    (Echo, Carotid, EGD, Colonoscopy, ERCP)    Subjective:  Patient resting in bed anxious to go home feels better than yesterday ambulated with staff sats maintained above 92% on room air Discharge Exam: Vitals:   10/04/19 0503 10/04/19 0733  BP: 111/79   Pulse:  72   Resp: 20   Temp: 98.1 F (36.7 C)   SpO2: 94% 93%   Vitals:   10/03/19 1959 10/03/19 2119 10/04/19 0503 10/04/19 0733  BP:  134/69 111/79   Pulse:  93 72   Resp:  18 20   Temp:  98.8 F (37.1 C) 98.1 F (36.7 C)   TempSrc:  Oral    SpO2: 92% 92% 94% 93%  Weight:   100.5 kg   Height:        General: Pt is alert, awake, not in acute distress Cardiovascular: RRR, S1/S2 +, no rubs, no gallops Respiratory: Wheezing decreased compared to yesterday bilaterally, no wheezing, no rhonchi Abdominal: Soft, NT, ND, bowel sounds + Extremities: no edema, no cyanosis    The results of significant diagnostics from this hospitalization (including imaging, microbiology, ancillary and laboratory) are listed below for reference.     Microbiology: Recent Results (from the past 240 hour(s))  Blood culture (routine x 2)     Status: None (Preliminary result)   Collection Time: 10/01/19 12:51 AM   Specimen: BLOOD  Result Value Ref Range Status   Specimen Description   Final    BLOOD RIGHT ANTECUBITAL Performed at [facility] [address] [place], [place] [address]    Special Requests   Final    BOTTLES DRAWN AEROBIC AND ANAEROBIC Blood Culture adequate volume Performed at [facility] [address] [place], [place] [address]    Culture   Final    NO GROWTH 3 DAYS Performed at [facility], [place] [address] [place], [place] [address]    Report Status PENDING  Incomplete  Blood culture (routine x 2)     Status: None (Preliminary result)   Collection Time: 10/01/19 12:52 AM   Specimen: BLOOD LEFT FOREARM  Result Value Ref Range Status   Specimen Description   Final    BLOOD LEFT FOREARM Performed at [facility], [place] [address], [place], [place] [address]    Special Requests   Final    BOTTLES  DRAWN AEROBIC AND ANAEROBIC Blood Culture adequate volume Performed at [facility] [address],  [place], [place] [address]    Culture   Final    NO GROWTH 3 DAYS Performed at [facility], [place] [address] [place], [place] [address]    Report Status PENDING  Incomplete  Urine culture     Status: Abnormal   Collection Time: 10/01/19 12:52 AM   Specimen: Urine, Clean Catch  Result Value Ref Range Status   Specimen Description   Final    URINE, CLEAN CATCH Performed at [facility], [place] [address], [place], [place] [address]    Special Requests   Final    NONE Performed at [facility], [place] [address], [place],  [address]    Culture (A)  Final    <10,000 COLONIES/mL INSIGNIFICANT GROWTH MULTIPLE SPECIES PRESENT, SUGGEST RECOLLECTION   Report Status 10/02/2019 FINAL  Final  SARS Coronavirus 2 by RT PCR (hospital order, performed in [facility] hospital lab) Nasopharyngeal Nasopharyngeal Swab     Status: None   Collection Time: 10/01/19 12:52 AM   Specimen: Nasopharyngeal Swab  Result Value Ref Range Status   SARS Coronavirus 2 NEGATIVE NEGATIVE Final    Comment: (NOTE) If result is NEGATIVE SARS-CoV-2 target nucleic acids are NOT DETECTED. The SARS-CoV-2 RNA is generally detectable in upper and lower  respiratory specimens during the acute phase of infection. The lowest  concentration of SARS-CoV-2 viral copies this assay can detect is 250  copies / mL. A negative result does not preclude SARS-CoV-2 infection  and should not be used as the sole basis for treatment or other  patient management decisions.  A negative result may occur with  improper specimen collection / handling, submission of specimen other  than nasopharyngeal swab, presence of viral mutation(s) within the  areas targeted by this assay, and inadequate number of viral copies  (<250 copies / mL). A negative result must be combined with clinical  observations, patient history, and epidemiological information. If result is POSITIVE SARS-CoV-2 target nucleic acids are  DETECTED. The SARS-CoV-2 RNA is generally detectable in upper and lower  respiratory specimens dur ing the acute phase of infection.  Positive  results are indicative of active infection with SARS-CoV-2.  Clinical  correlation with patient history and other diagnostic information is  necessary to determine patient infection status.  Positive results do  not rule out bacterial infection or co-infection with other viruses. If result is PRESUMPTIVE POSTIVE SARS-CoV-2 nucleic acids MAY BE PRESENT.   A presumptive positive result was obtained on the submitted specimen  and confirmed on repeat testing.  While 2019 novel coronavirus  (SARS-CoV-2) nucleic acids may be present in the submitted sample  additional confirmatory testing may be necessary for epidemiological  and / or clinical management purposes  to differentiate between  SARS-CoV-2 and other Sarbecovirus currently known to infect humans.  If clinically indicated additional testing with an alternate test  methodology [phone]) is advised. The SARS-CoV-2 RNA is generally  detectable in upper and lower respiratory sp ecimens during the acute  phase of infection. The expected result is Negative. Fact Sheet for Patients:  StrictlyIdeas.no Fact Sheet for Healthcare Providers: BankingDealers.co.za This test is not yet approved or cleared by the [place] FDA and has been authorized for detection and/or diagnosis of SARS-CoV-2 by FDA under an Emergency Use Authorization (EUA).  This EUA will remain in effect (meaning this test can be used) for  the duration of the COVID-19 declaration under Section 564(b)(1) of the Act, 21 U.S.C. section 360bbb-3(b)(1), unless the authorization is terminated or revoked sooner. Performed at [facility], [place] [address], [place], [place] [address]   MRSA PCR Screening     Status: None   Collection Time: 10/01/19  4:26 PM   Specimen:  Nasopharyngeal  Result Value Ref Range Status   MRSA by PCR NEGATIVE NEGATIVE Final    Comment:        The GeneXpert MRSA Assay (FDA approved for NASAL specimens only), is one component of a comprehensive MRSA colonization surveillance program. It is not intended to diagnose MRSA infection nor to guide or monitor treatment for MRSA infections. Performed at [facility], [place] [address], [place], [place] [address]      Labs: BNP (last 3 results) Recent Labs    10/01/19 0113  BNP [id number]   Basic Metabolic Panel: Recent Labs  Lab 10/01/19 0051 10/02/19 0421 10/03/19 0507 10/04/19 0512  NA 138 143 143 142  K 3.0* 3.2* 3.8 4.0  CL 101 101 101 101  CO2 30 32 34* 31  GLUCOSE 108* 94 103* 197*  BUN 7* 7* 8 11  CREATININE 0.71 0.64 0.66 0.59  CALCIUM 9.0 8.7* 8.6* 9.5  MG  --  1.5*  --   --    Liver Function Tests: Recent Labs  Lab 10/01/19 0051  AST 38  ALT 30  ALKPHOS 63  BILITOT 0.3  PROT 7.3  ALBUMIN 3.2*   No results for input(s): LIPASE, AMYLASE in the last 168 hours. No results for input(s): AMMONIA in the last 168 hours. CBC: Recent Labs  Lab 10/01/19 0051 10/02/19 0421  WBC 3.4* 13.9*  NEUTROABS 1.2*  --   HGB 12.3 11.3*  HCT 39.8 36.8  MCV 93.2 94.1  PLT 162 179   Cardiac Enzymes: No results for input(s): CKTOTAL, CKMB, CKMBINDEX, TROPONINI in the last 168 hours. BNP: Invalid input(s): POCBNP CBG: Recent Labs  Lab 10/03/19 1135 10/03/19 1659 10/03/19 2117 10/04/19 0735 10/04/19 1158  GLUCAP 173* 214* 252* 144* 295*   D-Dimer No results for input(s): DDIMER in the last 72 hours. Hgb A1c No results for input(s): HGBA1C in the last 72 hours. Lipid Profile No results for input(s): CHOL, HDL, LDLCALC, TRIG, CHOLHDL, LDLDIRECT in the last 72 hours. Thyroid function studies No results for input(s): TSH, T4TOTAL, T3FREE, THYROIDAB in the last 72 hours.  Invalid input(s): FREET3 Anemia work up No results for input(s):  VITAMINB12, FOLATE, FERRITIN, TIBC, IRON, RETICCTPCT in the last 72 hours. Urinalysis    Component Value Date/Time   COLORURINE YELLOW 10/01/2019 0051   APPEARANCEUR CLEAR 10/01/2019 0051   LABSPEC 1.021 10/01/2019 0051   PHURINE 5.0 10/01/2019 0051   GLUCOSEU NEGATIVE 10/01/2019 0051   GLUCOSEU NEGATIVE 01/05/2013 1535   HGBUR NEGATIVE 10/01/2019 0051   BILIRUBINUR NEGATIVE 10/01/2019 0051   BILIRUBINUR n 10/27/2014 1118   KETONESUR NEGATIVE 10/01/2019 0051   PROTEINUR NEGATIVE 10/01/2019 0051   UROBILINOGEN 0.2 10/27/2014 1118   UROBILINOGEN 1.0 12/23/2013 1344   NITRITE NEGATIVE 10/01/2019 0051   LEUKOCYTESUR NEGATIVE 10/01/2019 0051   Sepsis Labs Invalid input(s): PROCALCITONIN,  WBC,  LACTICIDVEN Microbiology Recent Results (from the past 240 hour(s))  Blood culture (routine x 2)     Status: None (Preliminary result)   Collection Time: 10/01/19 12:51 AM   Specimen: BLOOD  Result Value Ref Range Status   Specimen Description   Final  BLOOD RIGHT ANTECUBITAL Performed at [facility] [address], [place], [place] [address]    Special Requests   Final    BOTTLES DRAWN AEROBIC AND ANAEROBIC Blood Culture adequate volume Performed at [facility] [address], [place], [place] [address]    Culture   Final    NO GROWTH 3 DAYS Performed at [facility], [place] [address] [place], [place] [address]    Report Status PENDING  Incomplete  Blood culture (routine x 2)     Status: None (Preliminary result)   Collection Time: 10/01/19 12:52 AM   Specimen: BLOOD LEFT FOREARM  Result Value Ref Range Status   Specimen Description   Final    BLOOD LEFT FOREARM Performed at [facility], [place] [address] [place], [place] [address]    Special Requests   Final    BOTTLES DRAWN AEROBIC AND ANAEROBIC Blood Culture adequate volume Performed at [facility] [address], [place], [place] [address]    Culture    Final    NO GROWTH 3 DAYS Performed at [facility], [place] [address], [place], [place] [address]    Report Status PENDING  Incomplete  Urine culture     Status: Abnormal   Collection Time: 10/01/19 12:52 AM   Specimen: Urine, Clean Catch  Result Value Ref Range Status   Specimen Description   Final    URINE, CLEAN CATCH Performed at [facility], [place] [address] [place], [place] [address]    Special Requests   Final    NONE Performed at [facility], [place] [address], [place],  [address]    Culture (A)  Final    <10,000 COLONIES/mL INSIGNIFICANT GROWTH MULTIPLE SPECIES PRESENT, SUGGEST RECOLLECTION   Report Status 10/02/2019 FINAL  Final  SARS Coronavirus 2 by RT PCR (hospital order, performed in [facility] hospital lab) Nasopharyngeal Nasopharyngeal Swab     Status: None   Collection Time: 10/01/19 12:52 AM   Specimen: Nasopharyngeal Swab  Result Value Ref Range Status   SARS Coronavirus 2 NEGATIVE NEGATIVE Final    Comment: (NOTE) If result is NEGATIVE SARS-CoV-2 target nucleic acids are NOT DETECTED. The SARS-CoV-2 RNA is generally detectable in upper and lower  respiratory specimens during the acute phase of infection. The lowest  concentration of SARS-CoV-2 viral copies this assay can detect is 250  copies / mL. A negative result does not preclude SARS-CoV-2 infection  and should not be used as the sole basis for treatment or other  patient management decisions.  A negative result may occur with  improper specimen collection / handling, submission of specimen other  than nasopharyngeal swab, presence of viral mutation(s) within the  areas targeted by this assay, and inadequate number of viral copies  (<250 copies / mL). A negative result must be combined with clinical  observations, patient history, and epidemiological information. If result is POSITIVE SARS-CoV-2 target nucleic acids are DETECTED. The SARS-CoV-2 RNA is  generally detectable in upper and lower  respiratory specimens dur ing the acute phase of infection.  Positive  results are indicative of active infection with SARS-CoV-2.  Clinical  correlation with patient history and other diagnostic information is  necessary to determine patient infection status.  Positive results do  not rule out bacterial infection or co-infection with other viruses. If result is PRESUMPTIVE POSTIVE SARS-CoV-2 nucleic acids MAY BE PRESENT.   A presumptive positive result was  obtained on the submitted specimen  and confirmed on repeat testing.  While 2019 novel coronavirus  (SARS-CoV-2) nucleic acids may be present in the submitted sample  additional confirmatory testing may be necessary for epidemiological  and / or clinical management purposes  to differentiate between  SARS-CoV-2 and other Sarbecovirus currently known to infect humans.  If clinically indicated additional testing with an alternate test  methodology [phone]) is advised. The SARS-CoV-2 RNA is generally  detectable in upper and lower respiratory sp ecimens during the acute  phase of infection. The expected result is Negative. Fact Sheet for Patients:  StrictlyIdeas.no Fact Sheet for Healthcare Providers: BankingDealers.co.za This test is not yet approved or cleared by the [place] FDA and has been authorized for detection and/or diagnosis of SARS-CoV-2 by FDA under an Emergency Use Authorization (EUA).  This EUA will remain in effect (meaning this test can be used) for the duration of the COVID-19 declaration under Section 564(b)(1) of the Act, 21 U.S.C. section 360bbb-3(b)(1), unless the authorization is terminated or revoked sooner. Performed at [facility], [place] [address] [place], [place] [address]   MRSA PCR Screening     Status: None   Collection Time: 10/01/19  4:26 PM   Specimen: Nasopharyngeal  Result Value Ref  Range Status   MRSA by PCR NEGATIVE NEGATIVE Final    Comment:        The GeneXpert MRSA Assay (FDA approved for NASAL specimens only), is one component of a comprehensive MRSA colonization surveillance program. It is not intended to diagnose MRSA infection nor to guide or monitor treatment for MRSA infections. Performed at [facility], [place] [address], [place], [place] [address]      Time coordinating discharge: 39 minutes  SIGNED:   Georgette Shell, MD  [facility] 10/04/2019, 12:11 PM Pager   If 7PM-7AM, please contact night-coverage [email or web address] Password [email or web address]

## 2019-10-06 LAB — CULTURE, BLOOD (ROUTINE X 2)
Culture: NO GROWTH
Culture: NO GROWTH
Special Requests: ADEQUATE
Special Requests: ADEQUATE

## 2019-10-08 ENCOUNTER — Other Ambulatory Visit: Payer: Medicare Other

## 2019-10-08 ENCOUNTER — Ambulatory Visit: Payer: Medicare Other | Admitting: Hematology and Oncology

## 2019-10-08 ENCOUNTER — Ambulatory Visit: Payer: Medicare Other

## 2019-10-10 ENCOUNTER — Ambulatory Visit: Payer: Medicare Other

## 2019-10-14 DIAGNOSIS — S9422XS Injury of deep peroneal nerve at ankle and foot level, left leg, sequela: Secondary | ICD-10-CM | POA: Diagnosis not present

## 2019-10-14 DIAGNOSIS — G894 Chronic pain syndrome: Secondary | ICD-10-CM | POA: Diagnosis not present

## 2019-10-14 DIAGNOSIS — M47816 Spondylosis without myelopathy or radiculopathy, lumbar region: Secondary | ICD-10-CM | POA: Diagnosis not present

## 2019-10-14 DIAGNOSIS — Z79891 Long term (current) use of opiate analgesic: Secondary | ICD-10-CM | POA: Diagnosis not present

## 2019-10-14 NOTE — Progress Notes (Signed)
Patient Care Team: System, Pcp Not In as PCP - General Rigoberto Noel, MD as Consulting Physician (Pulmonary Disease) Adrian Prows, MD as Consulting Physician (Cardiology) Lenon Oms, MD as Referring Physician (Obstetrics and Gynecology) Veneda Melter, MD as Referring Physician (Cardiology) [name], MD as Consulting Physician (Anesthesiology) Terrance Mass, MD (Inactive) as Consulting Physician (Gynecology) Rockwell Germany, RN as Oncology Nurse Navigator Mauro Kaufmann, RN as Oncology Nurse Navigator Rolm Bookbinder, MD as Consulting Physician (General Surgery) Nicholas Lose, MD as Consulting Physician (Hematology and Oncology) Kyung Rudd, MD as Consulting Physician (Radiation Oncology) Alda Berthold, DO as Consulting Physician (Neurology)  DIAGNOSIS:    ICD-10-CM   1. Malignant neoplasm of upper-outer quadrant of right breast in female, estrogen receptor negative ([place])  C50.411    Z17.1     SUMMARY OF ONCOLOGIC HISTORY: Oncology History  Malignant neoplasm of upper-outer quadrant of right breast in female, estrogen receptor negative ([place])  09/07/2019 Initial Diagnosis   Routine screening mammogram detected a 2.1cm mass in the left breast and no left axillary adenopathy. 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   09/24/2019 -  Chemotherapy   The patient had palonosetron (ALOXI) injection 0.25 mg, 0.25 mg, Intravenous,  Once, 1 of 6 cycles Administration: 0.25 mg (09/24/2019) pegfilgrastim (NEULASTA ONPRO KIT) injection 6 mg, 6 mg, Subcutaneous, Once, 1 of 6 cycles Administration: 6 mg (09/24/2019) cyclophosphamide (CYTOXAN) 1,100 mg in sodium chloride 0.9 % 250 mL chemo infusion, 500 mg/m2 = 1,100 mg (83.3 % of original dose 600 mg/m2), Intravenous,  Once, 1 of 6 cycles Dose modification: 500 mg/m2 (original dose 600 mg/m2, Cycle 1, Reason: Provider Judgment) Administration: 1,100 mg (09/24/2019) DOCEtaxel (TAXOTERE)  140 mg in sodium chloride 0.9 % 250 mL chemo infusion, 65 mg/m2 = 140 mg (86.7 % of original dose 75 mg/m2), Intravenous,  Once, 1 of 6 cycles Dose modification: 65 mg/m2 (original dose 75 mg/m2, Cycle 1, Reason: Provider Judgment) Administration: 140 mg (09/24/2019) fosaprepitant (EMEND) 150 mg in sodium chloride 0.9 % 145 mL IVPB, , Intravenous,  Once, 1 of 6 cycles Administration:  (09/24/2019)  for chemotherapy treatment.      CHIEF COMPLIANT: Cycle 2 Taxotere and Cytoxan  INTERVAL HISTORY: Shelby Anderson is a 63 y.o. 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 She is currently receiving neoadjuvant chemotherapy with Taxotere and Cytoxan. Her port was placed on 09/23/19 by Dr. Donne Hazel. Echo on 10/01/19 showed an ejection fraction of 60-65%. She presents to the clinic today for cycle 2.   her breathing has improved.  Her CHF and asthma symptoms have resolved.  She does not have any leg swelling.  REVIEW OF SYSTEMS:   Constitutional: Denies fevers, chills or abnormal weight loss Eyes: Denies blurriness of vision Ears, nose, mouth, throat, and face: Denies mucositis or sore throat Respiratory: Denies cough, dyspnea or wheezes Cardiovascular: Denies palpitation, chest discomfort Gastrointestinal: Denies nausea, heartburn or change in bowel habits Skin: Denies abnormal skin rashes Lymphatics: Denies new lymphadenopathy or easy bruising Neurological: Denies numbness, tingling or new weaknesses Behavioral/Psych: Mood is stable, no new changes  Extremities: No lower extremity edema Breast: denies any pain or lumps or nodules in either breasts All other systems were reviewed with the patient and are negative.  I have reviewed the past medical history, past surgical history, social history and family history with the patient and they are unchanged from previous note.  ALLERGIES:  is allergic to  lodine [etodolac]; oxycontin [oxycodone hcl]; penicillins;  aspirin; darvocet [propoxyphene n-acetaminophen]; nitroglycerin; tramadol; ultram [tramadol hcl]; and valium.  MEDICATIONS:  Current Outpatient Medications  Medication Sig Dispense Refill  . albuterol (PROVENTIL) (2.5 MG/3ML) 0.083% nebulizer solution Take 3 mLs (2.5 mg total) by nebulization every 6 (six) hours as needed for wheezing or shortness of breath (J45.40). 75 mL 3  . albuterol (VENTOLIN HFA) 108 (90 Base) MCG/ACT inhaler Inhale 2 puffs into the lungs every 6 (six) hours as needed for wheezing or shortness of breath. 3 g 3  . aspirin EC 81 MG tablet Take 81 mg at bedtime by mouth.     Marland Kitchen atorvastatin (LIPITOR) 40 MG tablet TAKE 1 TABLET BY MOUTH EVERY DAY (Patient taking differently: Take 40 mg by mouth daily. ) 90 tablet 0  . budesonide-formoterol (SYMBICORT) 80-4.5 MCG/ACT inhaler INHALE 2 PUFFS INTO THE LUNGS EVERY MORNING AND ANOTHER 2 PUFFS 12 HOURS LATER (Patient taking differently: Inhale 2 puffs into the lungs 2 (two) times daily. ) 30.6 Inhaler 1  . CINNAMON PO Take 1,000 mg 2 (two) times daily by mouth.    . clobetasol cream (TEMOVATE) 0.05 % Apply topically 2 (two) times daily. 30 g 0  . doxepin (SINEQUAN) 10 MG capsule Take 20 mg by mouth at bedtime.     Marland Kitchen esomeprazole (NEXIUM) 40 MG capsule Take 1 capsule (40 mg total) by mouth 2 (two) times daily. 180 capsule 3  . fluticasone (FLONASE) 50 MCG/ACT nasal spray Place 2 sprays at bedtime as needed into both nostrils for allergies or rhinitis.     . furosemide (LASIX) 20 MG tablet Take 1 tablet (20 mg total) by mouth daily. 30 tablet 1  . glipiZIDE (GLUCOTROL XL) 5 MG 24 hr tablet TAKE 1 TABLET BY MOUTH EVERY DAY WITH BREAKFAST (Patient taking differently: Take 5 mg by mouth daily with breakfast. ) 90 tablet 3  . HYDROcodone-acetaminophen (NORCO) 10-325 MG tablet Take 1 tablet by mouth every 6 (six) hours as needed. (Patient taking differently: Take 1 tablet by mouth every 6 (six) hours as needed for moderate pain. ) 6 tablet 0  .  L-Methylfolate (DEPLIN) 7.5 MG TABS Take 7.5 mg by mouth daily with breakfast.     . lidocaine-prilocaine (EMLA) cream Apply to affected area once (Patient taking differently: Apply 1 application topically daily as needed (numbing). ) 30 g 3  . LORazepam (ATIVAN) 0.5 MG tablet Take 1 tablet (0.5 mg total) by mouth at bedtime as needed for sleep. 30 tablet 0  . LYRICA 150 MG capsule Take 150 mg by mouth 3 (three) times daily.   2  . metFORMIN (GLUCOPHAGE) 500 MG tablet TAKE 1 TABLET (500 MG TOTAL) BY MOUTH 2 (TWO) TIMES DAILY WITH A MEAL. 180 tablet 1  . Misc Natural Products (GLUCOS-CHONDROIT-MSM COMPLEX PO) Take 1 tablet 3 (three) times daily by mouth.     . mometasone (ELOCON) 0.1 % cream Apply 1 application topically daily as needed (irritation).     . montelukast (SINGULAIR) 10 MG tablet TAKE 1 TABLET BY MOUTH EVERYDAY AT BEDTIME (Patient taking differently: Take 10 mg by mouth at bedtime. ) 90 tablet 1  . Multiple Vitamin (MULITIVITAMIN WITH MINERALS) TABS Take 1 tablet by mouth daily.      . nitroGLYCERIN (NITROSTAT) 0.4 MG SL tablet Place 0.4 mg under the tongue every 5 (five) minutes as needed for chest pain.     Marland Kitchen ondansetron (ZOFRAN) 8 MG tablet Take 1 tablet (8 mg total) by mouth  2 (two) times daily as needed for refractory nausea / vomiting. Start on day 3 after chemo. 30 tablet 1  . Oxycodone HCl 20 MG TABS Take 20 mg every 4 (four) hours as needed by mouth (pain).     . OXYGEN Inhale 2 L into the lungs at bedtime as needed (shortness of breath).     . potassium chloride (K-DUR) 10 MEQ tablet TAKE 2 TABLETS BY MOUTH EVERY DAY (Patient taking differently: Take 20 mEq by mouth daily. ) 180 tablet 1  . predniSONE (DELTASONE) 10 MG tablet Take 4 tablets daily for 4 days then 3 tablets daily for 4 days then 2 tablets daily for 4 days then 1 tablet daily till done. 30 tablet 0  . prochlorperazine (COMPAZINE) 10 MG tablet Take 1 tablet (10 mg total) by mouth every 6 (six) hours as needed (Nausea  or vomiting). 30 tablet 1  . promethazine (PHENERGAN) 25 MG tablet Take 1 tablet (25 mg total) by mouth every 6 (six) hours as needed for nausea or vomiting. 90 tablet 3  . sucralfate (CARAFATE) 1 g tablet Take 1 tablet (1 g total) by mouth 4 (four) times daily -  with meals and at bedtime. Before taking slowly dissolve tablet in 1 Tablespoon of distilled water for about 15 minutes to creat a slurry. 360 tablet 3  . tiZANidine (ZANAFLEX) 4 MG tablet Take 4 mg by mouth every 8 (eight) hours as needed for muscle spasms.    . vitamin B-12 (CYANOCOBALAMIN) 500 MCG tablet Take 500 mcg by mouth daily.      No current facility-administered medications for this visit.     PHYSICAL EXAMINATION: ECOG PERFORMANCE STATUS: 1 - Symptomatic but completely ambulatory  Vitals:   10/15/19 1100  BP: 139/83  Pulse: 84  Resp: 18  Temp: 98.7 F (37.1 C)  SpO2: 94%   Filed Weights   10/15/19 1100  Weight: 224 lb (101.6 kg)    GENERAL: alert, no distress and comfortable SKIN: skin color, texture, turgor are normal, no rashes or significant lesions EYES: normal, Conjunctiva are pink and non-injected, sclera clear OROPHARYNX: no exudate, no erythema and lips, buccal mucosa, and tongue normal  NECK: supple, thyroid normal size, non-tender, without nodularity LYMPH: no palpable lymphadenopathy in the cervical, axillary or inguinal LUNGS: clear to auscultation and percussion with normal breathing effort HEART: regular rate & rhythm and no murmurs and no lower extremity edema ABDOMEN: abdomen soft, non-tender and normal bowel sounds MUSCULOSKELETAL: no cyanosis of digits and no clubbing  NEURO: alert & oriented x 3 with fluent speech, no focal motor/sensory deficits EXTREMITIES: No lower extremity edema  LABORATORY DATA:  I have reviewed the data as listed CMP Latest Ref Rng & Units 10/04/2019 10/03/2019 10/02/2019  Glucose 70 - 99 mg/dL 197(H) 103(H) 94  BUN 8 - 23 mg/dL 11 8 7(L)  Creatinine 0.44 -  1.00 mg/dL 0.59 0.66 0.64  Sodium 135 - 145 mmol/L 142 143 143  Potassium 3.5 - 5.1 mmol/L 4.0 3.8 3.2(L)  Chloride 98 - 111 mmol/L 101 101 101  CO2 22 - 32 mmol/L 31 34(H) 32  Calcium 8.9 - 10.3 mg/dL 9.5 8.6(L) 8.7(L)  Total Protein 6.5 - 8.1 g/dL - - -  Total Bilirubin 0.3 - 1.2 mg/dL - - -  Alkaline Phos 38 - 126 U/L - - -  AST 15 - 41 U/L - - -  ALT 0 - 44 U/L - - -    Lab Results  Component Value  Date   WBC 13.9 (H) 10/02/2019   HGB 11.3 (L) 10/02/2019   HCT 36.8 10/02/2019   MCV 94.1 10/02/2019   PLT 179 10/02/2019   NEUTROABS 1.2 (L) 10/01/2019    ASSESSMENT & PLAN:  Malignant neoplasm of upper-outer quadrant of right breast in female, estrogen receptor negative ([place]) 09/07/2019:Routine screening mammogram detected a 2.1cm mass in the left breast and no left axillary adenopathy. 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  Recommendations: 1.  Neoadjuvant chemotherapy with Taxotere/Cytoxan x 6 starting 09/24/2019 2. Breast conserving surgery followed by 3. Adjuvant radiation therapy  --------------------------------------------------------------------------------------------------------------------------------------------- Current treatment: Cycle 2 Taxotere and Cytoxan Chemo toxicities: 1.  Hospitalization 68/25/7493-55/01/1746: Diastolic CHF and asthma exacerbation with hypoxia (prednisone taper and Lasix) 2.  Fever with rash: Could be Taxotere related ANC nadir was 1.2   We will keep the dosage of her chemotherapy the same.  Return to clinic in 3 weeks for cycle 3    No orders of the defined types were placed in this encounter.  The patient has a good understanding of the overall plan. she agrees with it. she will call with any problems that may develop before the next visit here.  Nicholas Lose, MD 10/15/2019  Julious Oka Dorshimer am acting as scribe for Dr. Nicholas Lose.  I have reviewed the above documentation for accuracy and  completeness, and I agree with the above.

## 2019-10-15 ENCOUNTER — Inpatient Hospital Stay: Payer: Medicare Other | Admitting: Hematology and Oncology

## 2019-10-15 ENCOUNTER — Other Ambulatory Visit: Payer: Self-pay

## 2019-10-15 ENCOUNTER — Telehealth: Payer: Self-pay

## 2019-10-15 ENCOUNTER — Inpatient Hospital Stay: Payer: Medicare Other

## 2019-10-15 ENCOUNTER — Other Ambulatory Visit: Payer: Self-pay | Admitting: Hematology and Oncology

## 2019-10-15 ENCOUNTER — Inpatient Hospital Stay: Payer: Medicare Other | Attending: Oncology

## 2019-10-15 DIAGNOSIS — C50411 Malignant neoplasm of upper-outer quadrant of right female breast: Secondary | ICD-10-CM | POA: Diagnosis not present

## 2019-10-15 DIAGNOSIS — Z7689 Persons encountering health services in other specified circumstances: Secondary | ICD-10-CM | POA: Insufficient documentation

## 2019-10-15 DIAGNOSIS — Z79899 Other long term (current) drug therapy: Secondary | ICD-10-CM | POA: Diagnosis not present

## 2019-10-15 DIAGNOSIS — Z5111 Encounter for antineoplastic chemotherapy: Secondary | ICD-10-CM | POA: Diagnosis not present

## 2019-10-15 DIAGNOSIS — Z171 Estrogen receptor negative status [ER-]: Secondary | ICD-10-CM

## 2019-10-15 LAB — CMP (CANCER CENTER ONLY)
ALT: 54 U/L — ABNORMAL HIGH (ref 0–44)
AST: 36 U/L (ref 15–41)
Albumin: 3.5 g/dL (ref 3.5–5.0)
Alkaline Phosphatase: 98 U/L (ref 38–126)
Anion gap: 11 (ref 5–15)
BUN: 16 mg/dL (ref 8–23)
CO2: 31 mmol/L (ref 22–32)
Calcium: 9.1 mg/dL (ref 8.9–10.3)
Chloride: 100 mmol/L (ref 98–111)
Creatinine: 0.73 mg/dL (ref 0.44–1.00)
GFR, Est AFR Am: >60 mL/min (ref >60)
GFR, Estimated: >60 mL/min (ref >60)
Glucose, Bld: 141 mg/dL — ABNORMAL HIGH (ref 70–99)
Potassium: 4.2 mmol/L (ref 3.5–5.1)
Sodium: 142 mmol/L (ref 135–145)
Total Bilirubin: 0.4 mg/dL (ref 0.3–1.2)
Total Protein: 7.1 g/dL (ref 6.5–8.1)

## 2019-10-15 LAB — CBC WITH DIFFERENTIAL (CANCER CENTER ONLY)
Abs Immature Granulocytes: 0.07 10*3/uL (ref 0.00–0.07)
Basophils Absolute: 0.1 10*3/uL (ref 0.0–0.1)
Basophils Relative: 1 %
Eosinophils Absolute: 0.1 10*3/uL (ref 0.0–0.5)
Eosinophils Relative: 1 %
HCT: 43 % (ref 36.0–46.0)
Hemoglobin: 13.5 g/dL (ref 12.0–15.0)
Immature Granulocytes: 1 %
Lymphocytes Relative: 15 %
Lymphs Abs: 1.9 10*3/uL (ref 0.7–4.0)
MCH: 29 pg (ref 26.0–34.0)
MCHC: 31.4 g/dL (ref 30.0–36.0)
MCV: 92.5 fL (ref 80.0–100.0)
Monocytes Absolute: 1.5 10*3/uL — ABNORMAL HIGH (ref 0.1–1.0)
Monocytes Relative: 11 %
Neutro Abs: 9 10*3/uL — ABNORMAL HIGH (ref 1.7–7.7)
Neutrophils Relative %: 71 %
Platelet Count: 314 10*3/uL (ref 150–400)
RBC: 4.65 MIL/uL (ref 3.87–5.11)
RDW: 15.5 % (ref 11.5–15.5)
WBC Count: 12.7 10*3/uL — ABNORMAL HIGH (ref 4.0–10.5)
nRBC: 0 % (ref 0.0–0.2)

## 2019-10-15 LAB — MAGNESIUM: Magnesium: 1.8 mg/dL (ref 1.7–2.4)

## 2019-10-15 MED ORDER — SODIUM CHLORIDE 0.9 % IV SOLN
65.0000 mg/m2 | Freq: Once | INTRAVENOUS | Status: AC
  Administered 2019-10-15: 140 mg via INTRAVENOUS
  Filled 2019-10-15: qty 14

## 2019-10-15 MED ORDER — PEGFILGRASTIM 6 MG/0.6ML ~~LOC~~ PSKT
6.0000 mg | PREFILLED_SYRINGE | Freq: Once | SUBCUTANEOUS | Status: AC
  Administered 2019-10-15: 6 mg via SUBCUTANEOUS

## 2019-10-15 MED ORDER — SODIUM CHLORIDE 0.9% FLUSH
10.0000 mL | INTRAVENOUS | Status: DC | PRN
  Administered 2019-10-15: 15:00:00 10 mL
  Filled 2019-10-15: qty 10

## 2019-10-15 MED ORDER — SODIUM CHLORIDE 0.9 % IV SOLN
500.0000 mg/m2 | Freq: Once | INTRAVENOUS | Status: AC
  Administered 2019-10-15: 1100 mg via INTRAVENOUS
  Filled 2019-10-15: qty 55

## 2019-10-15 MED ORDER — PEGFILGRASTIM 6 MG/0.6ML ~~LOC~~ PSKT
PREFILLED_SYRINGE | SUBCUTANEOUS | Status: AC
  Filled 2019-10-15: qty 0.6

## 2019-10-15 MED ORDER — PALONOSETRON HCL INJECTION 0.25 MG/5ML
INTRAVENOUS | Status: AC
  Filled 2019-10-15: qty 5

## 2019-10-15 MED ORDER — SODIUM CHLORIDE 0.9 % IV SOLN
Freq: Once | INTRAVENOUS | Status: AC
  Administered 2019-10-15: 12:00:00 via INTRAVENOUS
  Filled 2019-10-15: qty 250

## 2019-10-15 MED ORDER — PALONOSETRON HCL INJECTION 0.25 MG/5ML
0.2500 mg | Freq: Once | INTRAVENOUS | Status: AC
  Administered 2019-10-15: 0.25 mg via INTRAVENOUS

## 2019-10-15 MED ORDER — HEPARIN SOD (PORK) LOCK FLUSH 100 UNIT/ML IV SOLN
500.0000 [IU] | Freq: Once | INTRAVENOUS | Status: AC | PRN
  Administered 2019-10-15: 500 [IU]
  Filled 2019-10-15: qty 5

## 2019-10-15 MED ORDER — SODIUM CHLORIDE 0.9 % IV SOLN
Freq: Once | INTRAVENOUS | Status: AC
  Administered 2019-10-15: 12:00:00 via INTRAVENOUS
  Filled 2019-10-15: qty 5

## 2019-10-15 NOTE — Patient Instructions (Signed)
Docetaxel injection What is this medicine? DOCETAXEL (doe se TAX el) is a chemotherapy drug. It targets fast dividing cells, like cancer cells, and causes these cells to die. This medicine is used to treat many types of cancers like breast cancer, certain stomach cancers, head and neck cancer, lung cancer, and prostate cancer. This medicine may be used for other purposes; ask your health care provider or pharmacist if you have questions. COMMON BRAND NAME(S): Docefrez, Taxotere What should I tell my health care provider before I take this medicine? They need to know if you have any of these conditions:  infection (especially a virus infection such as chickenpox, cold sores, or herpes)  liver disease  low blood counts, like low white cell, platelet, or red cell counts  an unusual or allergic reaction to docetaxel, polysorbate 80, other chemotherapy agents, other medicines, foods, dyes, or preservatives  pregnant or trying to get pregnant  breast-feeding How should I use this medicine? This drug is given as an infusion into a vein. It is administered in a hospital or clinic by a specially trained health care professional. Talk to your pediatrician regarding the use of this medicine in children. Special care may be needed. Overdosage: If you think you have taken too much of this medicine contact a poison control center or emergency room at once. NOTE: This medicine is only for you. Do not share this medicine with others. What if I miss a dose? It is important not to miss your dose. Call your doctor or health care professional if you are unable to keep an appointment. What may interact with this medicine?  aprepitant  certain antibiotics like erythromycin or clarithromycin  certain antivirals for HIV or hepatitis  certain medicines for fungal infections like fluconazole, itraconazole, ketoconazole, posaconazole, or  voriconazole  cimetidine  ciprofloxacin  conivaptan  cyclosporine  dronedarone  fluvoxamine  grapefruit juice  imatinib  verapamil This list may not describe all possible interactions. Give your health care provider a list of all the medicines, herbs, non-prescription drugs, or dietary supplements you use. Also tell them if you smoke, drink alcohol, or use illegal drugs. Some items may interact with your medicine. What should I watch for while using this medicine? Your condition will be monitored carefully while you are receiving this medicine. You will need important blood work done while you are taking this medicine. Call your doctor or health care professional for advice if you get a fever, chills or sore throat, or other symptoms of a cold or flu. Do not treat yourself. This drug decreases your body's ability to fight infections. Try to avoid being around people who are sick. Some products may contain alcohol. Ask your health care professional if this medicine contains alcohol. Be sure to tell all health care professionals you are taking this medicine. Certain medicines, like metronidazole and disulfiram, can cause an unpleasant reaction when taken with alcohol. The reaction includes flushing, headache, nausea, vomiting, sweating, and increased thirst. The reaction can last from 30 minutes to several hours. You may get drowsy or dizzy. Do not drive, use machinery, or do anything that needs mental alertness until you know how this medicine affects you. Do not stand or sit up quickly, especially if you are an older patient. This reduces the risk of dizzy or fainting spells. Alcohol may interfere with the effect of this medicine. Talk to your health care professional about your risk of cancer. You may be more at risk for certain types of cancer if   you take this medicine. Do not become pregnant while taking this medicine or for 6 months after stopping it. Women should inform their doctor if  they wish to become pregnant or think they might be pregnant. There is a potential for serious side effects to an unborn child. Talk to your health care professional or pharmacist for more information. Do not breast-feed an infant while taking this medicine or for 1 to 2 weeks after stopping it. Males who get this medicine must use a condom during sex with females who can get pregnant. If you get a woman pregnant, the baby could have birth defects. The baby could die before they are born. You will need to continue wearing a condom for 3 months after stopping the medicine. Tell your health care provider right away if your partner becomes pregnant while you are taking this medicine. This may interfere with the ability to father a child. You should talk to your doctor or health care professional if you are concerned about your fertility. What side effects may I notice from receiving this medicine? Side effects that you should report to your doctor or health care professional as soon as possible:  allergic reactions like skin rash, itching or hives, swelling of the face, lips, or tongue  blurred vision  breathing problems  changes in vision  low blood counts - This drug may decrease the number of white blood cells, red blood cells and platelets. You may be at increased risk for infections and bleeding.  nausea and vomiting  pain, redness or irritation at site where injected  pain, tingling, numbness in the hands or feet  redness, blistering, peeling, or loosening of the skin, including inside the mouth  signs of decreased platelets or bleeding - bruising, pinpoint red spots on the skin, black, tarry stools, nosebleeds  signs of decreased red blood cells - unusually weak or tired, fainting spells, lightheadedness  signs of infection - fever or chills, cough, sore throat, pain or difficulty passing urine  swelling of the ankle, feet, hands Side effects that usually do not require medical  attention (report to your doctor or health care professional if they continue or are bothersome):  constipation  diarrhea  fingernail or toenail changes  hair loss  loss of appetite  mouth sores  muscle pain This list may not describe all possible side effects. Call your doctor for medical advice about side effects. You may report side effects to FDA at 1-800-FDA-1088. Where should I keep my medicine? This drug is given in a hospital or clinic and will not be stored at home. NOTE: This sheet is a summary. It may not cover all possible information. If you have questions about this medicine, talk to your doctor, pharmacist, or health care provider.  2020 [employer]/Gold Standard (2019-01-23 12:23:11) Cyclophosphamide injection What is this medicine? CYCLOPHOSPHAMIDE (sye kloe FOSS fa mide) is a chemotherapy drug. It slows the growth of cancer cells. This medicine is used to treat many types of cancer like lymphoma, myeloma, leukemia, breast cancer, and ovarian cancer, to name a few. This medicine may be used for other purposes; ask your health care provider or pharmacist if you have questions. COMMON BRAND NAME(S): Cytoxan, Neosar What should I tell my health care provider before I take this medicine? They need to know if you have any of these conditions:  blood disorders  history of other chemotherapy  infection  kidney disease  liver disease  recent or ongoing radiation therapy  tumors in the   bone marrow  an unusual or allergic reaction to cyclophosphamide, other chemotherapy, other medicines, foods, dyes, or preservatives  pregnant or trying to get pregnant  breast-feeding How should I use this medicine? This drug is usually given as an injection into a vein or muscle or by infusion into a vein. It is administered in a hospital or clinic by a specially trained health care professional. Talk to your pediatrician regarding the use of this medicine in children. Special  care may be needed. Overdosage: If you think you have taken too much of this medicine contact a poison control center or emergency room at once. NOTE: This medicine is only for you. Do not share this medicine with others. What if I miss a dose? It is important not to miss your dose. Call your doctor or health care professional if you are unable to keep an appointment. What may interact with this medicine? This medicine may interact with the following medications:  amiodarone  amphotericin B  azathioprine  certain antiviral medicines for HIV or AIDS such as protease inhibitors (e.g., indinavir, ritonavir) and zidovudine  certain blood pressure medications such as benazepril, captopril, enalapril, fosinopril, lisinopril, moexipril, monopril, perindopril, quinapril, ramipril, trandolapril  certain cancer medications such as anthracyclines (e.g., daunorubicin, doxorubicin), busulfan, cytarabine, paclitaxel, pentostatin, tamoxifen, trastuzumab  certain diuretics such as chlorothiazide, chlorthalidone, hydrochlorothiazide, indapamide, metolazone  certain medicines that treat or prevent blood clots like warfarin  certain muscle relaxants such as succinylcholine  cyclosporine  etanercept  indomethacin  medicines to increase blood counts like filgrastim, pegfilgrastim, sargramostim  medicines used as general anesthesia  metronidazole  natalizumab This list may not describe all possible interactions. Give your health care provider a list of all the medicines, herbs, non-prescription drugs, or dietary supplements you use. Also tell them if you smoke, drink alcohol, or use illegal drugs. Some items may interact with your medicine. What should I watch for while using this medicine? Visit your doctor for checks on your progress. This drug may make you feel generally unwell. This is not uncommon, as chemotherapy can affect healthy cells as well as cancer cells. Report any side effects.  Continue your course of treatment even though you feel ill unless your doctor tells you to stop. Drink water or other fluids as directed. Urinate often, even at night. In some cases, you may be given additional medicines to help with side effects. Follow all directions for their use. Call your doctor or health care professional for advice if you get a fever, chills or sore throat, or other symptoms of a cold or flu. Do not treat yourself. This drug decreases your body's ability to fight infections. Try to avoid being around people who are sick. This medicine may increase your risk to bruise or bleed. Call your doctor or health care professional if you notice any unusual bleeding. Be careful brushing and flossing your teeth or using a toothpick because you may get an infection or bleed more easily. If you have any dental work done, tell your dentist you are receiving this medicine. You may get drowsy or dizzy. Do not drive, use machinery, or do anything that needs mental alertness until you know how this medicine affects you. Do not become pregnant while taking this medicine or for 1 year after stopping it. Women should inform their doctor if they wish to become pregnant or think they might be pregnant. Men should not father a child while taking this medicine and for 4 months after stopping it. There is   a potential for serious side effects to an unborn child. Talk to your health care professional or pharmacist for more information. Do not breast-feed an infant while taking this medicine. This medicine may interfere with the ability to have a child. This medicine has caused ovarian failure in some women. This medicine has caused reduced sperm counts in some men. You should talk with your doctor or health care professional if you are concerned about your fertility. If you are going to have surgery, tell your doctor or health care professional that you have taken this medicine. What side effects may I notice  from receiving this medicine? Side effects that you should report to your doctor or health care professional as soon as possible:  allergic reactions like skin rash, itching or hives, swelling of the face, lips, or tongue  low blood counts - this medicine may decrease the number of white blood cells, red blood cells and platelets. You may be at increased risk for infections and bleeding.  signs of infection - fever or chills, cough, sore throat, pain or difficulty passing urine  signs of decreased platelets or bleeding - bruising, pinpoint red spots on the skin, black, tarry stools, blood in the urine  signs of decreased red blood cells - unusually weak or tired, fainting spells, lightheadedness  breathing problems  dark urine  dizziness  palpitations  swelling of the ankles, feet, hands  trouble passing urine or change in the amount of urine  weight gain  yellowing of the eyes or skin Side effects that usually do not require medical attention (report to your doctor or health care professional if they continue or are bothersome):  changes in nail or skin color  hair loss  missed menstrual periods  mouth sores  nausea, vomiting This list may not describe all possible side effects. Call your doctor for medical advice about side effects. You may report side effects to FDA at 1-800-FDA-1088. Where should I keep my medicine? This drug is given in a hospital or clinic and will not be stored at home. NOTE: This sheet is a summary. It may not cover all possible information. If you have questions about this medicine, talk to your doctor, pharmacist, or health care provider.  2020 [employer]/Gold Standard (2012-10-03 16:22:58)  

## 2019-10-15 NOTE — Telephone Encounter (Signed)
RN left voicemail for patient to call back regarding appointments for 11/12.

## 2019-10-15 NOTE — Assessment & Plan Note (Signed)
09/07/2019:Routine screening mammogram detected a 2.1cm mass in the left breast and no left axillary adenopathy. 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  Recommendations: 1.  Neoadjuvant chemotherapy with Taxotere/Cytoxan x 6 starting 09/24/2019 2. Breast conserving surgery followed by 3. Adjuvant radiation therapy  --------------------------------------------------------------------------------------------------------------------------------------------- Current treatment: Cycle 2 Taxotere and Cytoxan Chemo toxicities: 1.  Hospitalization 12/[id number]: Diastolic CHF and asthma exacerbation with hypoxia (prednisone taper and Lasix) 2.  Fever with rash: Could be Taxotere related no evidence of infection ANC nadir was 1.2 but because of suspicion of infection, we will reduce the dosage for cycle 2.  Return to clinic in 3 weeks for cycle 3

## 2019-10-16 ENCOUNTER — Telehealth: Payer: Self-pay | Admitting: *Deleted

## 2019-10-16 ENCOUNTER — Ambulatory Visit (INDEPENDENT_AMBULATORY_CARE_PROVIDER_SITE_OTHER): Payer: Medicare Other | Admitting: Family Medicine

## 2019-10-16 ENCOUNTER — Encounter: Payer: Self-pay | Admitting: Family Medicine

## 2019-10-16 ENCOUNTER — Telehealth: Payer: Self-pay

## 2019-10-16 VITALS — BP 110/78 | HR 86 | Temp 97.9°F | Ht 66.0 in | Wt 227.0 lb

## 2019-10-16 DIAGNOSIS — E1159 Type 2 diabetes mellitus with other circulatory complications: Secondary | ICD-10-CM

## 2019-10-16 DIAGNOSIS — C50411 Malignant neoplasm of upper-outer quadrant of right female breast: Secondary | ICD-10-CM

## 2019-10-16 DIAGNOSIS — E119 Type 2 diabetes mellitus without complications: Secondary | ICD-10-CM | POA: Diagnosis not present

## 2019-10-16 DIAGNOSIS — E785 Hyperlipidemia, unspecified: Secondary | ICD-10-CM

## 2019-10-16 DIAGNOSIS — I951 Orthostatic hypotension: Secondary | ICD-10-CM | POA: Diagnosis not present

## 2019-10-16 DIAGNOSIS — I10 Essential (primary) hypertension: Secondary | ICD-10-CM

## 2019-10-16 DIAGNOSIS — I420 Dilated cardiomyopathy: Secondary | ICD-10-CM | POA: Diagnosis not present

## 2019-10-16 DIAGNOSIS — G629 Polyneuropathy, unspecified: Secondary | ICD-10-CM

## 2019-10-16 DIAGNOSIS — Z171 Estrogen receptor negative status [ER-]: Secondary | ICD-10-CM

## 2019-10-16 DIAGNOSIS — E1169 Type 2 diabetes mellitus with other specified complication: Secondary | ICD-10-CM

## 2019-10-16 MED ORDER — BLOOD GLUCOSE MONITOR KIT: PACK | 0 refills | Status: DC

## 2019-10-16 NOTE — Progress Notes (Signed)
Shelby Anderson DOB: Apr 30, 1956 Encounter date: 10/16/2019  This is a 63 y.o. female who presents to establish care. Chief Complaint  Patient presents with  . Establish Care    History of present illness: Had chemo yesterday and not feeling best today. This is her second round. Last time for 2-3 days she had some pretty good nausea/vomiting. She got IV zofran and she is on compazine, zofran. If she can lay still she does better. Can take applesauce and doc suggested mixing in protein powder to help give her some nutrients. She hasn't had a chance to try this yet. She is getting chemo for right breast upper outer quadrant cancer. She is supposed to take another 7 rounds. Ended up in hospital for a week with CHF after previous treatment.   Worried about blood sugars. Has been on solumedrol and steroids. States no meter because told insurance wouldn't cover it. Still taking metformin and glipizide. Has been borderline for years. Dx almost 2 years ago. Last A1C was 10/15 and was 6.8. States that sugars were up in the 300's in the hospital.   bp medications were stopped due to orthostatic hypotension. Has stayed below 540 systolic. Usually range from 106-130's.   OSA - uses cpap nightly.   Cough variant asthma - breathing was feeling good until she was put in hospital. Didn't realize how bad she was doing. Feels like it is harder to get big breath since chemo yesterday. If she is breathing more shallowly she does ok. Just seems to catch a little. Using symbicort. Using albuterol some weeks not at all; sometimes 3-4 times.   HL: still on the lipitor  Still follows with pain clinic - [facility] - neuropathy and low back pain/bulging discs. Dr. Hardin Negus.   Last colonoscopy was 2012. They are planning to repeat next year. Dr. Henrene Pastor.   Does still follow with Dr. Toney Rakes for gyn needs. Has to reschedule this appointment because of cancer treatments.   Stomach ulcer: noted in 2012; had  to have scopes since then and esoph dilation. Starting to have issues with it again. Takes big pills and they start to build up on her.   Depression: sometimes feels like she is going crazy; sometimes just wants to cry. Tries to not dwell on things. They gave her sheet for web meetings, but she doesn't get good service where she lives.   Has had multiple skin lesions removed. Following with dermatology. Dr. Jarome Matin for this    Past Medical History:  Diagnosis Date  . Allergy   . Anemia    "chronic"  . Angina   . Anxiety and depression   . Arthritis   . Asthma   . Atrial fibrillation ([place])    h/o "AF w/frequent PVCs"  . Breast cancer ([place])    Left  . Cancer (HCC)    hx of skin cancer   . CHF (congestive heart failure) ([place])   . Chronic back pain greater than 3 months duration    on chronic narcotics, treated at pain clinic  . Colon polyps    hyperplastic  . Coronary artery disease    Arrythmia, orthostatic hypotension, HLD, HTN; sees Dr. Einar Gip  . Difficult intubation    "TMJ & woke up when they were still cutting on me"  . Dysrhythmia    sees Dr. Einar Gip and a cardiologist at [facility]  . Esophageal stricture   . Family history of melanoma ]  . Family history of pancreatic  cancer   . Fatty liver   . Fibroids   . Fibromyalgia    "in my legs"  . GERD (gastroesophageal reflux disease)    hx hiatal hernia, stricture and gastric ulcer  . Headache(784.0)   . Heart murmur   . Hiatal hernia   . History of loop recorder   . History of migraines    "dx'd when I was in my teens"  . Hyperlipemia   . Hypertension   . Mental disorder   . Mild episode of recurrent major depressive disorder ([place]) 12/06/2015  . Myocardial infarction [facility]) 1980's & 1990;   sees Dr. Einar Gip  . OSA (obstructive sleep apnea)   . Pneumonia   . PONV (postoperative nausea and vomiting)   . Recurrent upper respiratory infection (URI)   . Shortness of breath 11/20/11   "all the time", sees pulmonlogy,  ? asthma  . Stenotic cervical os   . Stomach ulcer    "3 small; found in 05/2011"  . TMJ (dislocation of temporomandibular joint)   . Tuberculosis    + TB SKIN TEST  . Type 2 diabetes mellitus without complication, without long-term current use of insulin ([place]) 12/06/2015   not on meds    Past Surgical History:  Procedure Laterality Date  . ACHILLES TENDON REPAIR  1970's   left ankle  . ARTHROSCOPIC REPAIR ACL     left knee cap  . BREAST BIOPSY Right 04/08/2013  . CARDIAC CATHETERIZATION     loop recorder  . CARPAL TUNNEL RELEASE  unknown   left hand  . ESOPHAGOGASTRODUODENOSCOPY (EGD) WITH PROPOFOL N/A 10/29/2017   Procedure: ESOPHAGOGASTRODUODENOSCOPY (EGD) WITH PROPOFOL;  Surgeon: Irene Shipper, MD;  Location: [facility];  Service: Endoscopy;  Laterality: N/A;  . LOOP RECORDER IMPLANT    . PORTACATH PLACEMENT N/A 09/23/2019   Procedure: INSERTION PORT-A-CATH Right Internal Doreen Salvage;  Surgeon: Rolm Bookbinder, MD;  Location: [place];  Service: General;  Laterality: N/A;  . post ganglionectomy  1970's   "for migraine headaches"  . pouch string  [phone]   "did this 3 times (once w/each pregnancy)"  . SAVORY DILATION N/A 10/29/2017   Procedure: SAVORY DILATION;  Surgeon: Irene Shipper, MD;  Location: [facility];  Service: Endoscopy;  Laterality: N/A;  . TOTAL KNEE ARTHROPLASTY Left 09/25/2016   Procedure: LEFT TOTAL KNEE ARTHROPLASTY;  Surgeon: Paralee Cancel, MD;  Location: [facility];  Service: Orthopedics;  Laterality: Left;  . TUBAL LIGATION  1980's   Allergies  Allergen Reactions  . Lodine [Etodolac] Anaphylaxis, Hives and Swelling  . Oxycontin [Oxycodone Hcl] Anaphylaxis    hives, trouble breathing, tongue swelling (Only Oxycontin) Tolerates plain oxycodone.  Marland Kitchen Penicillins Anaphylaxis    Told by a surgeon never to take it again. Has patient had a PCN reaction causing immediate rash, facial/tongue/throat swelling, SOB or lightheadedness with hypotension:  Yes Has patient had a PCN reaction causing severe rash involving mucus membranes or skin necrosis: Unknown Has patient had a PCN reaction that required hospitalization: No Has patient had a PCN reaction occurring within the last 10 years: No If all of the above answers are "NO", then may proceed with Cephalosporin use.  . Aspirin Other (See Comments)    High-dose caused GI Bleeds  . Darvocet [Propoxyphene N-Acetaminophen] Hives  . Nitroglycerin     IV-BP drops dramatically  . Tramadol Hives and Itching  . Ultram [Tramadol Hcl] Hives  . Valium Other (See Comments)    Circulation problems. "Legs turned black".  Current Meds  Medication Sig  . albuterol (PROVENTIL) (2.5 MG/3ML) 0.083% nebulizer solution Take 3 mLs (2.5 mg total) by nebulization every 6 (six) hours as needed for wheezing or shortness of breath (J45.40).  Marland Kitchen albuterol (VENTOLIN HFA) 108 (90 Base) MCG/ACT inhaler Inhale 2 puffs into the lungs every 6 (six) hours as needed for wheezing or shortness of breath.  Marland Kitchen aspirin EC 81 MG tablet Take 81 mg at bedtime by mouth.   Marland Kitchen atorvastatin (LIPITOR) 40 MG tablet TAKE 1 TABLET BY MOUTH EVERY DAY (Patient taking differently: Take 40 mg by mouth daily. )  . budesonide-formoterol (SYMBICORT) 80-4.5 MCG/ACT inhaler INHALE 2 PUFFS INTO THE LUNGS EVERY MORNING AND ANOTHER 2 PUFFS 12 HOURS LATER (Patient taking differently: Inhale 2 puffs into the lungs 2 (two) times daily. )  . CINNAMON PO Take 1,000 mg 2 (two) times daily by mouth.  . clobetasol cream (TEMOVATE) 0.05 % Apply topically 2 (two) times daily.  Marland Kitchen doxepin (SINEQUAN) 10 MG capsule Take 20 mg by mouth at bedtime.   Marland Kitchen esomeprazole (NEXIUM) 40 MG capsule Take 1 capsule (40 mg total) by mouth 2 (two) times daily.  . fluticasone (FLONASE) 50 MCG/ACT nasal spray Place 2 sprays at bedtime as needed into both nostrils for allergies or rhinitis.   . furosemide (LASIX) 20 MG tablet Take 1 tablet (20 mg total) by mouth daily.  Marland Kitchen glipiZIDE  (GLUCOTROL XL) 5 MG 24 hr tablet TAKE 1 TABLET BY MOUTH EVERY DAY WITH BREAKFAST (Patient taking differently: Take 5 mg by mouth daily with breakfast. )  . HYDROcodone-acetaminophen (NORCO) 10-325 MG tablet Take 1 tablet by mouth every 6 (six) hours as needed. (Patient taking differently: Take 1 tablet by mouth every 6 (six) hours as needed for moderate pain. )  . L-Methylfolate (DEPLIN) 7.5 MG TABS Take 7.5 mg by mouth daily with breakfast.   . lidocaine-prilocaine (EMLA) cream Apply to affected area once (Patient taking differently: Apply 1 application topically daily as needed (numbing). )  . LORazepam (ATIVAN) 0.5 MG tablet Take 1 tablet (0.5 mg total) by mouth at bedtime as needed for sleep.  Marland Kitchen LYRICA 150 MG capsule Take 150 mg by mouth 3 (three) times daily.   . metFORMIN (GLUCOPHAGE) 500 MG tablet TAKE 1 TABLET (500 MG TOTAL) BY MOUTH 2 (TWO) TIMES DAILY WITH A MEAL.  Marland Kitchen Misc Natural Products (GLUCOS-CHONDROIT-MSM COMPLEX PO) Take 1 tablet 3 (three) times daily by mouth.   . mometasone (ELOCON) 0.1 % cream Apply 1 application topically daily as needed (irritation).   . montelukast (SINGULAIR) 10 MG tablet TAKE 1 TABLET BY MOUTH EVERYDAY AT BEDTIME (Patient taking differently: Take 10 mg by mouth at bedtime. )  . Multiple Vitamin (MULITIVITAMIN WITH MINERALS) TABS Take 1 tablet by mouth daily.    . nitroGLYCERIN (NITROSTAT) 0.4 MG SL tablet Place 0.4 mg under the tongue every 5 (five) minutes as needed for chest pain.   Marland Kitchen ondansetron (ZOFRAN) 8 MG tablet Take 1 tablet (8 mg total) by mouth 2 (two) times daily as needed for refractory nausea / vomiting. Start on day 3 after chemo.  Marland Kitchen Oxycodone HCl 20 MG TABS Take 20 mg every 4 (four) hours as needed by mouth (pain).   . OXYGEN Inhale 2 L into the lungs at bedtime as needed (shortness of breath).   . potassium chloride (K-DUR) 10 MEQ tablet TAKE 2 TABLETS BY MOUTH EVERY DAY (Patient taking differently: Take 20 mEq by mouth daily. )  . predniSONE  (  DELTASONE) 10 MG tablet Take 4 tablets daily for 4 days then 3 tablets daily for 4 days then 2 tablets daily for 4 days then 1 tablet daily till done.  . prochlorperazine (COMPAZINE) 10 MG tablet Take 1 tablet (10 mg total) by mouth every 6 (six) hours as needed (Nausea or vomiting).  . promethazine (PHENERGAN) 25 MG tablet Take 1 tablet (25 mg total) by mouth every 6 (six) hours as needed for nausea or vomiting.  . sucralfate (CARAFATE) 1 g tablet Take 1 tablet (1 g total) by mouth 4 (four) times daily -  with meals and at bedtime. Before taking slowly dissolve tablet in 1 Tablespoon of distilled water for about 15 minutes to creat a slurry.  Marland Kitchen tiZANidine (ZANAFLEX) 4 MG tablet Take 4 mg by mouth every 8 (eight) hours as needed for muscle spasms.  . vitamin B-12 (CYANOCOBALAMIN) 500 MCG tablet Take 500 mcg by mouth daily.    Social History   Tobacco Use  . Smoking status: Never Smoker  . Smokeless tobacco: Never Used  Substance Use Topics  . Alcohol use: No    Alcohol/week: 0.0 standard drinks   Family History  Problem Relation Age of Onset  . Malignant hyperthermia Father   . Hypertension Father   . Heart disease Father   . Diabetes Father   . Cancer Father        skin  . Hypertension Mother   . Heart disease Mother   . Multiple myeloma Mother   . Cancer Sister        CERVICAL  . Hypertension Sister   . Cancer Brother 22       MELANOMA  . Heart disease Maternal Grandmother   . Other Maternal Grandmother 32       complications of childbirth  . Heart disease Maternal Grandfather   . Cancer Paternal Grandmother        ?   Marland Kitchen Heart disease Paternal Grandmother   . Heart disease Paternal Grandfather   . Cancer Brother        LUNG  . Diabetes Sister   . Hypertension Sister   . Heart disease Sister   . Cancer Sister   . Cancer Brother   . Pancreatic cancer Niece 30  . Cancer Nephew 40       unknown- currently in the [employer]  . Anesthesia problems Neg Hx   . Hypotension  Neg Hx   . Pseudochol deficiency Neg Hx   . Colon cancer Neg Hx   . Esophageal cancer Neg Hx   . Rectal cancer Neg Hx   . Stomach cancer Neg Hx   . Breast cancer Neg Hx      Review of Systems  Constitutional: Positive for fatigue. Negative for chills and fever.  HENT: Negative for congestion.   Respiratory: Negative for cough, chest tightness, shortness of breath and wheezing.   Cardiovascular: Negative for chest pain, palpitations and leg swelling.  Gastrointestinal: Positive for nausea and vomiting.  Musculoskeletal: Positive for back pain.  Neurological: Positive for numbness.    Objective:  BP 110/78 (BP Location: Left Arm, Patient Position: Sitting, Cuff Size: Large)   Pulse 86   Temp 97.9 F (36.6 C) (Temporal)   Ht _0  (1.676 m)   Wt 227 lb (103 kg)   SpO2 96%   BMI 36.64 kg/m   Weight: 227 lb (103 kg)   BP Readings from Last 3 Encounters:  10/16/19 110/78  10/15/19 139/83  10/04/19 111/79  Wt Readings from Last 3 Encounters:  10/16/19 227 lb (103 kg)  10/15/19 224 lb (101.6 kg)  10/04/19 221 lb 8 oz (100.5 kg)    Physical Exam Constitutional:      General: She is not in acute distress.    Appearance: She is well-developed.  Cardiovascular:     Rate and Rhythm: Normal rate and regular rhythm.     Heart sounds: Normal heart sounds. No murmur. No friction rub.  Pulmonary:     Effort: Pulmonary effort is normal. No respiratory distress.     Breath sounds: Normal breath sounds. No wheezing or rales.  Musculoskeletal:     Right lower leg: No edema.     Left lower leg: No edema.  Neurological:     Mental Status: She is alert and oriented to person, place, and time.  Psychiatric:        Behavior: Behavior normal.     Assessment/Plan:  1. Type 2 diabetes mellitus without complication, without long-term current use of insulin (HCC) We are going to get her home glucometer since she is able to check blood sugars at home.  She is very worried that they  have been elevated secondary to all of the steroids she has been put on.  I am going to have her check 1-2 times daily and then report back to me next week with how sugars are doing.  She is tapering down on prednisone currently.  I have also encouraged her to check whenever she is not feeling well at home so we can get an idea of where sugars are running.  Further blood work pending her update next week. - blood glucose meter kit and supplies KIT; Dispense based on patient and insurance preference. Use to check sugars up to twice daily.  Dispense: 1 each; Refill: 0  2. Hypertension associated with diabetes ([place]) Stable off of medications currently.  She continues to monitor blood pressures.  3. Malignant neoplasm of upper-outer quadrant of right breast in female, estrogen receptor negative [facility]) Following with oncology regularly.  Currently undergoing chemotherapy treatment.  4. Orthostatic hypotension See above.  Blood pressures have been stable.  She is monitoring.  5. Dilated cardiomyopathy ([place]) She is following with cardiology.  6. Hyperlipidemia associated with type 2 diabetes mellitus ([place]) Currently on statin treatment.  She is tolerating this well.  7. Neuropathy Follows with pain management for back pain as well as neuropathy.  This is stable.  Return for pending update on sugars next week.  [name], MD  Of note, we did call oncology office today to see if she can be given something else for nausea.  Unfortunately they confirmed that she received Aloxi, so she is unable to receive Zofran in the office here today.  She will continue to use the Compazine and she is going to try to add protein powder into her applesauce to see if this helps her feel better.

## 2019-10-16 NOTE — Telephone Encounter (Signed)
Kathlee Nations called back and stated the pt had a chemo treatment yesterday and during this the pt is given Aloxi, which is a pre-med in the same class as Zofran and this medication stays in the system for 3-5 days and Zofran was not recommended as this would not provide any additional relief.  Message sent to Dr Ethlyn Gallery and Kathlee Nations stated her direct line is [phone].

## 2019-10-16 NOTE — Telephone Encounter (Signed)
RN returned call to Dr. Berenice Bouton office regarding request for Zofran injection.  RN informed Di Kindle, at office that patient received Aloxi on 11/12, and Zofran injection would be ineffective.  Verbalized OK for other antiemetics if available.  Staff will notify MD, direct number provided for call back if needed.

## 2019-10-16 NOTE — Telephone Encounter (Signed)
Per Dr Ethlyn Gallery, I called the cancer center and left a detailed message on the voicemail of Dr Marilynne Drivers nurse stating the pt is here in the office now and Dr Ethlyn Gallery wanted to know if the pt could be given an injection of Zofran?  Dr Ethlyn Gallery stated the pt told her the oncology office stated she could not receive another injection.  CRM also created.

## 2019-10-16 NOTE — Telephone Encounter (Signed)
Noted  

## 2019-10-16 NOTE — Patient Instructions (Signed)
When you get glucometer, check twice daily at home to get a baseline. Check fasting morning sugar and then an evening sugar. Do not check right after eating; wait at least 2 hours before checking. You can alternate when you check. I would recommend checking if you don't feel well.

## 2019-10-17 ENCOUNTER — Ambulatory Visit: Payer: Medicare Other

## 2019-10-22 ENCOUNTER — Ambulatory Visit: Payer: Medicare Other | Admitting: Hematology and Oncology

## 2019-10-22 ENCOUNTER — Other Ambulatory Visit: Payer: Medicare Other

## 2019-10-22 ENCOUNTER — Ambulatory Visit: Payer: Medicare Other

## 2019-10-24 DIAGNOSIS — J189 Pneumonia, unspecified organism: Secondary | ICD-10-CM | POA: Diagnosis not present

## 2019-10-24 DIAGNOSIS — J45909 Unspecified asthma, uncomplicated: Secondary | ICD-10-CM | POA: Diagnosis not present

## 2019-10-24 DIAGNOSIS — R0902 Hypoxemia: Secondary | ICD-10-CM | POA: Diagnosis not present

## 2019-10-24 DIAGNOSIS — J4540 Moderate persistent asthma, uncomplicated: Secondary | ICD-10-CM | POA: Diagnosis not present

## 2019-10-26 ENCOUNTER — Ambulatory Visit (INDEPENDENT_AMBULATORY_CARE_PROVIDER_SITE_OTHER): Payer: Medicare Other | Admitting: Adult Health

## 2019-10-26 ENCOUNTER — Telehealth: Payer: Self-pay | Admitting: Pulmonary Disease

## 2019-10-26 ENCOUNTER — Ambulatory Visit (HOSPITAL_COMMUNITY)
Admission: RE | Admit: 2019-10-26 | Discharge: 2019-10-26 | Disposition: A | Discharge status: Home / Self Care | Payer: Medicare Other | Source: Ambulatory Visit | Attending: Adult Health | Admitting: Adult Health

## 2019-10-26 ENCOUNTER — Other Ambulatory Visit: Payer: Self-pay

## 2019-10-26 ENCOUNTER — Other Ambulatory Visit (HOSPITAL_COMMUNITY)
Admission: RE | Admit: 2019-10-26 | Discharge: 2019-10-26 | Disposition: A | Discharge status: Home / Self Care | Payer: Medicare Other | Attending: Adult Health | Admitting: Adult Health

## 2019-10-26 ENCOUNTER — Encounter: Payer: Self-pay | Admitting: Adult Health

## 2019-10-26 ENCOUNTER — Encounter: Payer: Self-pay | Admitting: *Deleted

## 2019-10-26 DIAGNOSIS — R0602 Shortness of breath: Secondary | ICD-10-CM | POA: Insufficient documentation

## 2019-10-26 LAB — BASIC METABOLIC PANEL
Anion gap: 12 (ref 5–15)
BUN: 11 mg/dL (ref 8–23)
CO2: 28 mmol/L (ref 22–32)
Calcium: 8.8 mg/dL — ABNORMAL LOW (ref 8.9–10.3)
Chloride: 100 mmol/L (ref 98–111)
Creatinine, Ser: 0.8 mg/dL (ref 0.44–1.00)
GFR calc Af Amer: >60 mL/min (ref >60)
GFR calc non Af Amer: >60 mL/min (ref >60)
Glucose, Bld: 91 mg/dL (ref 70–99)
Potassium: 3.7 mmol/L (ref 3.5–5.1)
Sodium: 140 mmol/L (ref 135–145)

## 2019-10-26 LAB — CBC WITH DIFFERENTIAL/PLATELET
Abs Immature Granulocytes: 1.07 10*3/uL — ABNORMAL HIGH (ref 0.00–0.07)
Basophils Absolute: 0 10*3/uL (ref 0.0–0.1)
Basophils Relative: 0 %
Eosinophils Absolute: 0 10*3/uL (ref 0.0–0.5)
Eosinophils Relative: 0 %
HCT: 39.5 % (ref 36.0–46.0)
Hemoglobin: 12.4 g/dL (ref 12.0–15.0)
Immature Granulocytes: 5 %
Lymphocytes Relative: 8 %
Lymphs Abs: 1.6 10*3/uL (ref 0.7–4.0)
MCH: 29.1 pg (ref 26.0–34.0)
MCHC: 31.4 g/dL (ref 30.0–36.0)
MCV: 92.7 fL (ref 80.0–100.0)
Monocytes Absolute: 1.3 10*3/uL — ABNORMAL HIGH (ref 0.1–1.0)
Monocytes Relative: 6 %
Neutro Abs: 16.5 10*3/uL — ABNORMAL HIGH (ref 1.7–7.7)
Neutrophils Relative %: 81 %
Platelets: 149 10*3/uL — ABNORMAL LOW (ref 150–400)
RBC: 4.26 MIL/uL (ref 3.87–5.11)
RDW: 15.9 % — ABNORMAL HIGH (ref 11.5–15.5)
WBC: 20.5 10*3/uL — ABNORMAL HIGH (ref 4.0–10.5)
nRBC: 0.3 % — ABNORMAL HIGH (ref 0.0–0.2)

## 2019-10-26 LAB — BRAIN NATRIURETIC PEPTIDE: B Natriuretic Peptide: 108 pg/mL — ABNORMAL HIGH (ref 0.0–100.0)

## 2019-10-26 MED ORDER — FUROSEMIDE 20 MG PO TABS: ORAL_TABLET | ORAL | 1 refills | Status: DC

## 2019-10-26 NOTE — Patient Instructions (Addendum)
Extra Lasix 20mg  daily for 3 days .  Weigh daily.  Continue on CHF program .  Continue on Oxygen 2l/m .  May use Albuterol Nebs every 6hrs as needed for wheezing .  Continue on Symbicort 2 puffs Twice daily  , rinse after use.  Chest xray and labs at [facility] .  COVID 19 testing at [facility] .  Follow up with Dr. Elsworth Soho  In 2 weeks and As needed   Please contact office for sooner follow up if symptoms do not improve or worsen or seek emergency care

## 2019-10-26 NOTE — Progress Notes (Addendum)
Virtual Visit via Telephone Note  I connected with Shelby Anderson on 10/26/19 at 10:30 AM EST by telephone and verified that I am speaking with the correct person using two identifiers.  Location: Patient: Home  Provider: Office     I discussed the limitations, risks, security and privacy concerns of performing an evaluation and management service by telephone and the availability of in person appointments. I also discussed with the patient that there may be a patient responsible charge related to this service. The patient expressed understanding and agreed to proceed.   History of Present Illness: 63 year old female followed for persistent asthma and obstructive sleep apnea Medical history significant for breast cancer (diagnosed 2020)-currently undergoing chemotherapy, diabetes, chronic back pain, A. fib Disabled RN  Today's televisit is an acute office visit.  Patient complains over the last 3 days that she has had intermittent wheezing initially and a low-grade fever.  She has had no fever for the last 48 hours.  And wheezing has decreased.  She complains that she has gained  2 pounds.  She has a chronic dry cough does not notice any increase.  Says that her shortness of breath has increased and is noticed that she has had low oxygen with activity.  She is started back wearing her oxygen at 2 L which has kept her O2 saturations greater than 90%. Patient was recently admitted late last month for acute decompensated diastolic heart failure and neutropenia with fever.  She was treated with IV Lasix steroids and nebulized treatments.  2D echo showed an EF of 60 to 65%.  CT chest was negative for PE, showed mild pulmonary edema.  She denies any hemoptysis or calf pain no chest pain.  COVID-19 testing was negative.  Cultures were negative.  She denies any loss of taste or smell.  As above has been diagnosed with Breast cancer , undergoing Chemotherapy (Docetaxel, Cytoxan) . Last treatment 1.5  weeks ago. Says tolerated okay . Has had 2 of 6 cycles.    Observations/Objective: Spirometry 12/2011 - no airway obstruction -No reversibility with bronchodilator , mod restriction Spirometry8/2019FVC 1.64 (46%), FEV1 1.34 (49%), ratio 82 (104%)  07/2018 FENO 11  Home sleep test 07/2012 - showed desatn,no OSA  11/2015 ONO positive , Started on oxygen 2l/m .  10/2016 CT angio neg PE  CT sinus 01/2017 >> clear  12/2013 -admitted asthma exacerbation,CAP, RSV bronchitis.  EGD 03/2011 ([facility]) - peptic ulcer disease.  12/2013 barium esophagram - distal esophageal stricture,stricture dilated again 2017,2018([facility]) 01/2017 Swallow >>Persistent/recurrent stricture at the GE junction  HST 08/2018 AHI 9/h 09/2018 >> CPAP 10 cm, med FF mask Has been in store and pick up food.   Chemo 1.5 weeks ago (tx 2/6)    Assessment and Plan: Mild asthma flare/URI- Low suspicion for COVID-19 however with her recent fever, immunosuppression will need to rule out COVID-19. We will hold on antibiotics and steroids at this time.  Continue on aggressive pulmonary regimen with Symbicort.  Use albuterol nebulizer as needed.  Diurese as able.  Continue on oxygen therapy.  Check chest x-ray today   Diastolic heart failure-recent decompensation with hospitalization.  Patient with increased shortness of breath hypoxia and weight gain of 2 pounds.  Suspicious for possible volume overload.  Will increase Lasix to 40 mg for the next 3 days.  Check labs today with CBC, be met, BNP.  She will continue on oxygen.  And close follow-up in 2 weeks.  Acute on chronic respiratory failure-patient has been  off of oxygen for a while now.  Now with exertional hypoxia.  Suspect is related to decompensated heart failure.  Patient will continue on 2 L of oxygen.  O2 saturation goal is greater than 90%.  Advised of red flags to look out for such as worsening shortness of breath, persistent hypoxemia, etc   Plan  Patient  Instructions  Extra Lasix 39m daily for 3 days .  Weigh daily.  Continue on CHF program .  Continue on Oxygen 2l/m .  May use Albuterol Nebs every 6hrs as needed for wheezing .  Continue on Symbicort 2 puffs Twice daily  , rinse after use.  [facility]  [facility]  Follow up with Dr. AElsworth Soho In 2 weeks and As needed   Please contact office for sooner follow up if symptoms do not improve or worsen or seek emergency care         Follow Up Instructions:   Follow-up in 2 weeks and as needed  Please contact office for sooner follow up if symptoms do not improve or worsen or seek emergency care   I discussed the assessment and treatment plan with the patient. The patient was provided an opportunity to ask questions and all were answered. The patient agreed with the plan and demonstrated an understanding of the instructions.   The patient was advised to call back or seek an in-person evaluation if the symptoms worsen or if the condition fails to improve as anticipated.  I provided 32  minutes of non-face-to-face time during this encounter.   TRexene Edison NP

## 2019-10-26 NOTE — Telephone Encounter (Signed)
Call returned to patient, confirmed DOB, she states she does the CHF program with [employer]. She has gained 2lbs and she is more SOB. Oxygen is staying at 82-85%. She reports her heart rate was 120. She states she was told to come in for a CXR and to continue to monitor her oxygen by Dr. Elsworth Soho over the weekend. She is currently is getting chemo for breast cancer. Her temp was 98.7 temporal this morning. She does have a cardiologist, she see's Dr. Eustace Pen. She is using 2.5 liters of oxygen and audible wheezing on phone.   Spoke with TP, sounds more cardiac related, okay to see her but she needs to contact cardiology.   Recommendations given to patient. Appt made. Nothing further needed at this time.

## 2019-10-26 NOTE — Telephone Encounter (Signed)
This encounter was created in error - please disregard.

## 2019-10-27 ENCOUNTER — Other Ambulatory Visit: Payer: Self-pay

## 2019-10-27 DIAGNOSIS — Z20822 Contact with and (suspected) exposure to covid-19: Secondary | ICD-10-CM

## 2019-10-27 NOTE — Progress Notes (Signed)
Called spoke with patient, advised of lab results / recs as stated by Parrett NP.  Pt verbalized understanding and denied any questions. 

## 2019-10-27 NOTE — Progress Notes (Signed)
Called spoke with patient, advised of cxr results / recs as stated by Parrett NP.  Pt verbalized understanding and denied any questions. 

## 2019-10-28 LAB — NOVEL CORONAVIRUS, NAA: SARS-CoV-2, NAA: NOT DETECTED

## 2019-10-30 ENCOUNTER — Emergency Department (HOSPITAL_COMMUNITY): Payer: Medicare Other

## 2019-10-30 ENCOUNTER — Encounter (HOSPITAL_COMMUNITY): Payer: Self-pay

## 2019-10-30 ENCOUNTER — Telehealth: Payer: Self-pay | Admitting: *Deleted

## 2019-10-30 ENCOUNTER — Other Ambulatory Visit: Payer: Self-pay

## 2019-10-30 ENCOUNTER — Inpatient Hospital Stay (HOSPITAL_COMMUNITY)
Admission: EM | Admit: 2019-10-30 | Discharge: 2019-11-03 | DRG: 205 | Disposition: A | Discharge status: Home / Self Care | Payer: Medicare Other | Attending: Internal Medicine | Admitting: Internal Medicine

## 2019-10-30 DIAGNOSIS — T451X5A Adverse effect of antineoplastic and immunosuppressive drugs, initial encounter: Secondary | ICD-10-CM | POA: Diagnosis not present

## 2019-10-30 DIAGNOSIS — Z7982 Long term (current) use of aspirin: Secondary | ICD-10-CM

## 2019-10-30 DIAGNOSIS — Z885 Allergy status to narcotic agent status: Secondary | ICD-10-CM

## 2019-10-30 DIAGNOSIS — Z20828 Contact with and (suspected) exposure to other viral communicable diseases: Secondary | ICD-10-CM | POA: Diagnosis present

## 2019-10-30 DIAGNOSIS — J9601 Acute respiratory failure with hypoxia: Secondary | ICD-10-CM | POA: Diagnosis not present

## 2019-10-30 DIAGNOSIS — Z6835 Body mass index (BMI) 35.0-35.9, adult: Secondary | ICD-10-CM

## 2019-10-30 DIAGNOSIS — F339 Major depressive disorder, recurrent, unspecified: Secondary | ICD-10-CM | POA: Diagnosis present

## 2019-10-30 DIAGNOSIS — I480 Paroxysmal atrial fibrillation: Secondary | ICD-10-CM | POA: Diagnosis not present

## 2019-10-30 DIAGNOSIS — Z7951 Long term (current) use of inhaled steroids: Secondary | ICD-10-CM

## 2019-10-30 DIAGNOSIS — Z7722 Contact with and (suspected) exposure to environmental tobacco smoke (acute) (chronic): Secondary | ICD-10-CM | POA: Diagnosis present

## 2019-10-30 DIAGNOSIS — J449 Chronic obstructive pulmonary disease, unspecified: Secondary | ICD-10-CM | POA: Diagnosis present

## 2019-10-30 DIAGNOSIS — C50412 Malignant neoplasm of upper-outer quadrant of left female breast: Secondary | ICD-10-CM

## 2019-10-30 DIAGNOSIS — F419 Anxiety disorder, unspecified: Secondary | ICD-10-CM | POA: Diagnosis present

## 2019-10-30 DIAGNOSIS — E785 Hyperlipidemia, unspecified: Secondary | ICD-10-CM | POA: Diagnosis present

## 2019-10-30 DIAGNOSIS — Z96652 Presence of left artificial knee joint: Secondary | ICD-10-CM | POA: Diagnosis present

## 2019-10-30 DIAGNOSIS — E1165 Type 2 diabetes mellitus with hyperglycemia: Secondary | ICD-10-CM | POA: Diagnosis not present

## 2019-10-30 DIAGNOSIS — I2510 Atherosclerotic heart disease of native coronary artery without angina pectoris: Secondary | ICD-10-CM | POA: Diagnosis present

## 2019-10-30 DIAGNOSIS — Z7984 Long term (current) use of oral hypoglycemic drugs: Secondary | ICD-10-CM

## 2019-10-30 DIAGNOSIS — J9621 Acute and chronic respiratory failure with hypoxia: Secondary | ICD-10-CM | POA: Diagnosis present

## 2019-10-30 DIAGNOSIS — M797 Fibromyalgia: Secondary | ICD-10-CM | POA: Diagnosis not present

## 2019-10-30 DIAGNOSIS — I5032 Chronic diastolic (congestive) heart failure: Secondary | ICD-10-CM | POA: Diagnosis present

## 2019-10-30 DIAGNOSIS — G4733 Obstructive sleep apnea (adult) (pediatric): Secondary | ICD-10-CM | POA: Diagnosis not present

## 2019-10-30 DIAGNOSIS — J704 Drug-induced interstitial lung disorders, unspecified: Principal | ICD-10-CM | POA: Diagnosis present

## 2019-10-30 DIAGNOSIS — Z807 Family history of other malignant neoplasms of lymphoid, hematopoietic and related tissues: Secondary | ICD-10-CM

## 2019-10-30 DIAGNOSIS — Z85828 Personal history of other malignant neoplasm of skin: Secondary | ICD-10-CM

## 2019-10-30 DIAGNOSIS — E119 Type 2 diabetes mellitus without complications: Secondary | ICD-10-CM | POA: Diagnosis not present

## 2019-10-30 DIAGNOSIS — Z886 Allergy status to analgesic agent status: Secondary | ICD-10-CM

## 2019-10-30 DIAGNOSIS — E1140 Type 2 diabetes mellitus with diabetic neuropathy, unspecified: Secondary | ICD-10-CM | POA: Diagnosis present

## 2019-10-30 DIAGNOSIS — C50411 Malignant neoplasm of upper-outer quadrant of right female breast: Secondary | ICD-10-CM | POA: Diagnosis present

## 2019-10-30 DIAGNOSIS — Z888 Allergy status to other drugs, medicaments and biological substances status: Secondary | ICD-10-CM

## 2019-10-30 DIAGNOSIS — T380X5A Adverse effect of glucocorticoids and synthetic analogues, initial encounter: Secondary | ICD-10-CM | POA: Diagnosis not present

## 2019-10-30 DIAGNOSIS — I10 Essential (primary) hypertension: Secondary | ICD-10-CM | POA: Diagnosis present

## 2019-10-30 DIAGNOSIS — Z801 Family history of malignant neoplasm of trachea, bronchus and lung: Secondary | ICD-10-CM

## 2019-10-30 DIAGNOSIS — J4541 Moderate persistent asthma with (acute) exacerbation: Secondary | ICD-10-CM | POA: Diagnosis present

## 2019-10-30 DIAGNOSIS — J9600 Acute respiratory failure, unspecified whether with hypoxia or hypercapnia: Secondary | ICD-10-CM | POA: Diagnosis present

## 2019-10-30 DIAGNOSIS — E1159 Type 2 diabetes mellitus with other circulatory complications: Secondary | ICD-10-CM | POA: Diagnosis present

## 2019-10-30 DIAGNOSIS — R0902 Hypoxemia: Secondary | ICD-10-CM

## 2019-10-30 DIAGNOSIS — Z880 Allergy status to penicillin: Secondary | ICD-10-CM

## 2019-10-30 DIAGNOSIS — I5031 Acute diastolic (congestive) heart failure: Secondary | ICD-10-CM | POA: Diagnosis not present

## 2019-10-30 DIAGNOSIS — Z9981 Dependence on supplemental oxygen: Secondary | ICD-10-CM

## 2019-10-30 DIAGNOSIS — I252 Old myocardial infarction: Secondary | ICD-10-CM

## 2019-10-30 DIAGNOSIS — E1169 Type 2 diabetes mellitus with other specified complication: Secondary | ICD-10-CM | POA: Diagnosis present

## 2019-10-30 DIAGNOSIS — Z833 Family history of diabetes mellitus: Secondary | ICD-10-CM

## 2019-10-30 DIAGNOSIS — Z9851 Tubal ligation status: Secondary | ICD-10-CM

## 2019-10-30 DIAGNOSIS — Z79899 Other long term (current) drug therapy: Secondary | ICD-10-CM

## 2019-10-30 DIAGNOSIS — R0602 Shortness of breath: Secondary | ICD-10-CM | POA: Diagnosis not present

## 2019-10-30 DIAGNOSIS — Z8611 Personal history of tuberculosis: Secondary | ICD-10-CM

## 2019-10-30 DIAGNOSIS — R609 Edema, unspecified: Secondary | ICD-10-CM | POA: Diagnosis not present

## 2019-10-30 DIAGNOSIS — M545 Low back pain: Secondary | ICD-10-CM | POA: Diagnosis not present

## 2019-10-30 DIAGNOSIS — K219 Gastro-esophageal reflux disease without esophagitis: Secondary | ICD-10-CM | POA: Diagnosis present

## 2019-10-30 DIAGNOSIS — L658 Other specified nonscarring hair loss: Secondary | ICD-10-CM | POA: Diagnosis present

## 2019-10-30 DIAGNOSIS — G8929 Other chronic pain: Secondary | ICD-10-CM | POA: Diagnosis present

## 2019-10-30 DIAGNOSIS — Z800 Family history of malignant neoplasm of digestive organs: Secondary | ICD-10-CM

## 2019-10-30 DIAGNOSIS — I152 Hypertension secondary to endocrine disorders: Secondary | ICD-10-CM | POA: Diagnosis not present

## 2019-10-30 DIAGNOSIS — Z171 Estrogen receptor negative status [ER-]: Secondary | ICD-10-CM | POA: Diagnosis not present

## 2019-10-30 DIAGNOSIS — Z808 Family history of malignant neoplasm of other organs or systems: Secondary | ICD-10-CM

## 2019-10-30 DIAGNOSIS — L271 Localized skin eruption due to drugs and medicaments taken internally: Secondary | ICD-10-CM | POA: Diagnosis not present

## 2019-10-30 DIAGNOSIS — M549 Dorsalgia, unspecified: Secondary | ICD-10-CM | POA: Diagnosis present

## 2019-10-30 DIAGNOSIS — R0600 Dyspnea, unspecified: Secondary | ICD-10-CM | POA: Diagnosis not present

## 2019-10-30 DIAGNOSIS — K0889 Other specified disorders of teeth and supporting structures: Secondary | ICD-10-CM | POA: Diagnosis present

## 2019-10-30 DIAGNOSIS — Z8049 Family history of malignant neoplasm of other genital organs: Secondary | ICD-10-CM

## 2019-10-30 DIAGNOSIS — Z8249 Family history of ischemic heart disease and other diseases of the circulatory system: Secondary | ICD-10-CM

## 2019-10-30 DIAGNOSIS — Z95828 Presence of other vascular implants and grafts: Secondary | ICD-10-CM

## 2019-10-30 LAB — CBC WITH DIFFERENTIAL/PLATELET
Abs Immature Granulocytes: 0.07 10*3/uL (ref 0.00–0.07)
Basophils Absolute: 0.1 10*3/uL (ref 0.0–0.1)
Basophils Relative: 1 %
Eosinophils Absolute: 0 10*3/uL (ref 0.0–0.5)
Eosinophils Relative: 0 %
HCT: 37.9 % (ref 36.0–46.0)
Hemoglobin: 11.8 g/dL — ABNORMAL LOW (ref 12.0–15.0)
Immature Granulocytes: 1 %
Lymphocytes Relative: 11 %
Lymphs Abs: 1.1 10*3/uL (ref 0.7–4.0)
MCH: 29.3 pg (ref 26.0–34.0)
MCHC: 31.1 g/dL (ref 30.0–36.0)
MCV: 94 fL (ref 80.0–100.0)
Monocytes Absolute: 0.7 10*3/uL (ref 0.1–1.0)
Monocytes Relative: 7 %
Neutro Abs: 7.9 10*3/uL — ABNORMAL HIGH (ref 1.7–7.7)
Neutrophils Relative %: 80 %
Platelets: 171 10*3/uL (ref 150–400)
RBC: 4.03 MIL/uL (ref 3.87–5.11)
RDW: 15.5 % (ref 11.5–15.5)
WBC: 9.8 10*3/uL (ref 4.0–10.5)
nRBC: 0 % (ref 0.0–0.2)

## 2019-10-30 LAB — COMPREHENSIVE METABOLIC PANEL
ALT: 28 U/L (ref 0–44)
AST: 32 U/L (ref 15–41)
Albumin: 3.3 g/dL — ABNORMAL LOW (ref 3.5–5.0)
Alkaline Phosphatase: 93 U/L (ref 38–126)
Anion gap: 10 (ref 5–15)
BUN: 11 mg/dL (ref 8–23)
CO2: 33 mmol/L — ABNORMAL HIGH (ref 22–32)
Calcium: 8.9 mg/dL (ref 8.9–10.3)
Chloride: 98 mmol/L (ref 98–111)
Creatinine, Ser: 0.69 mg/dL (ref 0.44–1.00)
GFR calc Af Amer: >60 mL/min (ref >60)
GFR calc non Af Amer: >60 mL/min (ref >60)
Glucose, Bld: 128 mg/dL — ABNORMAL HIGH (ref 70–99)
Potassium: 3.4 mmol/L — ABNORMAL LOW (ref 3.5–5.1)
Sodium: 141 mmol/L (ref 135–145)
Total Bilirubin: 0.6 mg/dL (ref 0.3–1.2)
Total Protein: 7 g/dL (ref 6.5–8.1)

## 2019-10-30 LAB — POC SARS CORONAVIRUS 2 AG -  ED: SARS Coronavirus 2 Ag: NEGATIVE

## 2019-10-30 LAB — URINALYSIS, ROUTINE W REFLEX MICROSCOPIC
Bilirubin Urine: NEGATIVE
Glucose, UA: NEGATIVE mg/dL
Hgb urine dipstick: NEGATIVE
Ketones, ur: NEGATIVE mg/dL
Nitrite: NEGATIVE
Protein, ur: NEGATIVE mg/dL
Specific Gravity, Urine: 1.006 (ref 1.005–1.030)
pH: 5 (ref 5.0–8.0)

## 2019-10-30 LAB — TROPONIN I (HIGH SENSITIVITY)
Troponin I (High Sensitivity): 4 ng/L (ref <18)
Troponin I (High Sensitivity): 3 ng/L (ref <18)

## 2019-10-30 LAB — BRAIN NATRIURETIC PEPTIDE: B Natriuretic Peptide: 54 pg/mL (ref 0.0–100.0)

## 2019-10-30 LAB — PROTIME-INR
INR: 1 (ref 0.8–1.2)
Prothrombin Time: 12.7 seconds (ref 11.4–15.2)

## 2019-10-30 LAB — LACTIC ACID, PLASMA: Lactic Acid, Venous: 1.6 mmol/L (ref 0.5–1.9)

## 2019-10-30 LAB — APTT: aPTT: 26 seconds (ref 24–36)

## 2019-10-30 MED ORDER — IOHEXOL 350 MG/ML SOLN
100.0000 mL | Freq: Once | INTRAVENOUS | Status: AC | PRN
  Administered 2019-10-30: 100 mL via INTRAVENOUS

## 2019-10-30 MED ORDER — HYDROCODONE-ACETAMINOPHEN 10-325 MG PO TABS
1.0000 | ORAL_TABLET | Freq: Once | ORAL | Status: AC
  Administered 2019-10-30: 1 via ORAL
  Filled 2019-10-30: qty 1

## 2019-10-30 MED ORDER — SODIUM CHLORIDE 0.9 % IV SOLN
1.0000 g | Freq: Once | INTRAVENOUS | Status: AC
  Administered 2019-10-30: 1 g via INTRAVENOUS
  Filled 2019-10-30: qty 10

## 2019-10-30 MED ORDER — SODIUM CHLORIDE 0.9 % IV SOLN
500.0000 mg | Freq: Once | INTRAVENOUS | Status: AC
  Administered 2019-10-31: 500 mg via INTRAVENOUS
  Filled 2019-10-30: qty 500

## 2019-10-30 MED ORDER — ALBUTEROL SULFATE HFA 108 (90 BASE) MCG/ACT IN AERS
4.0000 | INHALATION_SPRAY | Freq: Once | RESPIRATORY_TRACT | Status: AC
  Administered 2019-10-30: 4 via RESPIRATORY_TRACT
  Filled 2019-10-30: qty 6.7

## 2019-10-30 NOTE — ED Provider Notes (Signed)
[facility] DEPT Provider Note   CSN: [id number] Arrival date & time: 10/30/19  1547     History   Chief Complaint Chief Complaint  Patient presents with  . Shortness of Breath    HPI Shelby Anderson is a 63 y.o. female.     HPI  63 year old female presents with shortness of breath.  Has been present for about 6 days.  Recently had a negative Covid test, chest x-ray and labs performed about 3 days ago.  She was told to take extra Lasix for about 3 days which temporarily helped but now she is feeling worse.  She did notice some swelling in her left leg greater than right but when she accumulates fluid this is where it typically goes.  She has noticed her O2 sats decreasing at home and at 1 point was 70% on room air when walking.  She also has chest pressure and heaviness whenever she exerts herself.  No vomiting but has been nauseated.  Has a chronic cough that is unchanged.  No fevers though at one point her temperature was 99.6. O2 sats 85% on room air.  Past Medical History:  Diagnosis Date  . Allergy   . Anemia    "chronic"  . Angina   . Anxiety and depression   . Arthritis   . Asthma   . Atrial fibrillation ([place])    h/o "AF w/frequent PVCs"  . Breast cancer ([place])    Left  . Cancer (HCC)    hx of skin cancer   . CHF (congestive heart failure) ([place])   . Chronic back pain greater than 3 months duration    on chronic narcotics, treated at pain clinic  . Colon polyps    hyperplastic  . Coronary artery disease    Arrythmia, orthostatic hypotension, HLD, HTN; sees Dr. Einar Gip  . Difficult intubation    "TMJ & woke up when they were still cutting on me"  . Dysrhythmia    sees Dr. Einar Gip and a cardiologist at [facility]  . Esophageal stricture   . Family history of melanoma ]  . Family history of pancreatic cancer   . Fatty liver   . Fibroids   . Fibromyalgia    "in my legs"  . GERD (gastroesophageal reflux disease)    hx hiatal  hernia, stricture and gastric ulcer  . Headache(784.0)   . Heart murmur   . Hiatal hernia   . History of loop recorder   . History of migraines    "dx'd when I was in my teens"  . Hyperlipemia   . Hypertension   . Mental disorder   . Mild episode of recurrent major depressive disorder ([place]) 12/06/2015  . Myocardial infarction [facility]) 1980's & 1990;   sees Dr. Einar Gip  . OSA (obstructive sleep apnea)   . Pneumonia   . PONV (postoperative nausea and vomiting)   . Recurrent upper respiratory infection (URI)   . Shortness of breath 11/20/11   "all the time", sees pulmonlogy, ? asthma  . Stenotic cervical os   . Stomach ulcer    "3 small; found in 05/2011"  . TMJ (dislocation of temporomandibular joint)   . Tuberculosis    + TB SKIN TEST  . Type 2 diabetes mellitus without complication, without long-term current use of insulin ([place]) 12/06/2015   not on meds     Patient Active Problem List   Diagnosis Date Noted  . Acute on chronic diastolic CHF (congestive heart  failure) ([place]) 10/01/2019  . Neutropenia with fever ([place]) 10/01/2019  . Genetic testing 09/17/2019  . Family history of melanoma   . Family history of pancreatic cancer   . Malignant neoplasm of upper-outer quadrant of right breast in female, estrogen receptor negative ([place]) 09/07/2019  . Encounter for loop recorder at end of battery life 04/24/2018  . Esophageal stricture 07/01/2017  . Hyperlipidemia associated with type 2 diabetes mellitus ([place]) 05/20/2017  . Allergic rhinitis 03/11/2017  . Dilated cardiomyopathy ([place]) 02/07/2017  . 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 Morbid obesity due to excess calories ([place]) 09/26/2016  . S/P left TKA 09/25/2016  . Increased endometrial stripe thickness 06/27/2016  . Intramural leiomyoma of uterus 06/27/2016  . Type 2 diabetes mellitus without complication, without long-term current use of insulin ([place]) 12/06/2015  . Chronic respiratory failure ([place]) 09/15/2015   . Dyspnea 09/01/2015  . Hypertension associated with diabetes ([place]) 07/19/2015  . Arrhythmia 07/19/2015  . Orthostatic hypotension 07/19/2015  . GERD (gastroesophageal reflux disease) 07/19/2015  . Chronic back pain 07/19/2015  . Neuropathy 07/19/2015  . Symptomatic PVCs 11/02/2014  . Syncope 11/02/2014  . Chest pain 12/23/2013  . Disorder of cervix 03/10/2013  . Vaginal atrophy 03/10/2013  . OSA (obstructive sleep apnea) 07/31/2012  . Coronary artery disease 11/20/2011    Past Surgical History:  Procedure Laterality Date  . ACHILLES TENDON REPAIR  1970's   left ankle  . ARTHROSCOPIC REPAIR ACL     left knee cap  . BREAST BIOPSY Right 04/08/2013  . CARDIAC CATHETERIZATION     loop recorder  . CARPAL TUNNEL RELEASE  unknown   left hand  . ESOPHAGOGASTRODUODENOSCOPY (EGD) WITH PROPOFOL N/A 10/29/2017   Procedure: ESOPHAGOGASTRODUODENOSCOPY (EGD) WITH PROPOFOL;  Surgeon: Irene Shipper, MD;  Location: [facility];  Service: Endoscopy;  Laterality: N/A;  . LOOP RECORDER IMPLANT    . PORTACATH PLACEMENT N/A 09/23/2019   Procedure: INSERTION PORT-A-CATH Right Internal Doreen Salvage;  Surgeon: Rolm Bookbinder, MD;  Location: [facility];  Service: General;  Laterality: N/A;  . post ganglionectomy  1970's   "for migraine headaches"  . pouch string  [phone]   "did this 3 times (once w/each pregnancy)"  . SAVORY DILATION N/A 10/29/2017   Procedure: SAVORY DILATION;  Surgeon: Irene Shipper, MD;  Location: [facility];  Service: Endoscopy;  Laterality: N/A;  . TOTAL KNEE ARTHROPLASTY Left 09/25/2016   Procedure: LEFT TOTAL KNEE ARTHROPLASTY;  Surgeon: Paralee Cancel, MD;  Location: [facility];  Service: Orthopedics;  Laterality: Left;  . TUBAL LIGATION  1980's     OB History    Gravida  5   Para  3   Term      Preterm      AB  2   Living  3     SAB  2   TAB      Ectopic      Multiple      Live Births               Home Medications    Prior to  Admission medications   Medication Sig Start Date End Date Taking? Authorizing Provider  albuterol (PROVENTIL) (2.5 MG/3ML) 0.083% nebulizer solution Take 3 mLs (2.5 mg total) by nebulization every 6 (six) hours as needed for wheezing or shortness of breath (J45.40). 09/19/16  Yes Javier Glazier, MD  albuterol (VENTOLIN HFA) 108 (90 Base) MCG/ACT inhaler Inhale 2 puffs into the lungs every 6 (six) hours  as needed for wheezing or shortness of breath. 09/07/19  Yes Rigoberto Noel, MD  aspirin EC 81 MG tablet Take 81 mg at bedtime by mouth.    Yes [provider]  atorvastatin (LIPITOR) 40 MG tablet TAKE 1 TABLET BY MOUTH EVERY DAY Patient taking differently: Take 40 mg by mouth daily.  09/16/19  Yes Colin Benton R, DO  budesonide-formoterol (SYMBICORT) 80-4.5 MCG/ACT inhaler INHALE 2 PUFFS INTO THE LUNGS EVERY MORNING AND ANOTHER 2 PUFFS 12 HOURS LATER Patient taking differently: Inhale 2 puffs into the lungs 2 (two) times daily.  12/16/18  Yes Martyn Ehrich, NP  CINNAMON PO Take 1,000 mg 2 (two) times daily by mouth.   Yes [provider]  clobetasol cream (TEMOVATE) 0.05 % Apply topically 2 (two) times daily. 10/04/19  Yes Georgette Shell, MD  doxepin (SINEQUAN) 10 MG capsule Take 20 mg by mouth at bedtime.  03/27/19  Yes [provider]  esomeprazole (NEXIUM) 40 MG capsule Take 1 capsule (40 mg total) by mouth 2 (two) times daily. 08/19/19  Yes Irene Shipper, MD  fluticasone [facility]) 50 MCG/ACT nasal spray Place 2 sprays at bedtime as needed into both nostrils for allergies or rhinitis.    Yes [provider]  furosemide (LASIX) 20 MG tablet 1-2 daily as directed. Patient taking differently: Take 20-40 mg by mouth See admin instructions. Take 38m daily and may take another 232mwhen directed from doctor. 10/26/19  Yes Parrett, Tammy S, NP  glipiZIDE (GLUCOTROL XL) 5 MG 24 hr tablet TAKE 1 TABLET BY MOUTH EVERY DAY WITH BREAKFAST Patient taking  differently: Take 5 mg by mouth daily with breakfast.  06/22/19  Yes KiLucretia KernDO  HYDROcodone-acetaminophen (NORCO) 10-325 MG tablet Take 1 tablet by mouth every 6 (six) hours as needed. Patient taking differently: Take 1 tablet by mouth every 6 (six) hours as needed for moderate pain.  09/23/19 09/22/20 Yes WaRolm BookbinderMD  L-Methylfolate (DEPLIN) 7.5 MG TABS Take 7.5 mg by mouth daily with breakfast.    Yes [provider]  lidocaine-prilocaine (EMLA) cream Apply to affected area once Patient taking differently: Apply 1 application topically daily as needed (numbing).  09/10/19  Yes GuNicholas LoseMD  LORazepam (ATIVAN) 0.5 MG tablet Take 1 tablet (0.5 mg total) by mouth at bedtime as needed for sleep. 09/10/19  Yes GuNicholas LoseMD  LYRICA 150 MG capsule Take 150 mg by mouth 3 (three) times daily.  05/28/18  Yes [provider]  metFORMIN (GLUCOPHAGE) 500 MG tablet TAKE 1 TABLET (500 MG TOTAL) BY MOUTH 2 (TWO) TIMES DAILY WITH A MEAL. 09/28/19  Yes Koberlein, JuSteele BergMD  Misc Natural Products (GLUCOS-CHONDROIT-MSM COMPLEX PO) Take 1 tablet 3 (three) times daily by mouth.    Yes [provider]  mometasone (ELOCON) 0.1 % cream Apply 1 application topically daily as needed (irritation).  05/17/19  Yes [provider]  montelukast (SINGULAIR) 10 MG tablet TAKE 1 TABLET BY MOUTH EVERYDAY AT BEDTIME Patient taking differently: Take 10 mg by mouth at bedtime.  08/13/19  Yes AlRigoberto NoelMD  Multiple Vitamin (MULITIVITAMIN WITH MINERALS) TABS Take 1 tablet by mouth daily.     Yes [provider]  nitroGLYCERIN (NITROSTAT) 0.4 MG SL tablet Place 0.4 mg under the tongue every 5 (five) minutes as needed for chest pain.    Yes [provider]  ondansetron (ZOFRAN) 8 MG tablet Take 1 tablet (8 mg total) by mouth 2 (  two) times daily as needed for refractory nausea / vomiting. Start on day 3 after chemo. 09/10/19  Yes Nicholas Lose, MD  Oxycodone  HCl 20 MG TABS Take 20 mg every 4 (four) hours as needed by mouth (pain).  08/09/17  Yes [provider]  potassium chloride (K-DUR) 10 MEQ tablet TAKE 2 TABLETS BY MOUTH EVERY DAY Patient taking differently: Take 20 mEq by mouth daily.  07/23/19  Yes Lucretia Kern, DO  prochlorperazine (COMPAZINE) 10 MG tablet Take 1 tablet (10 mg total) by mouth every 6 (six) hours as needed (Nausea or vomiting). 09/10/19  Yes Nicholas Lose, MD  promethazine (PHENERGAN) 25 MG tablet Take 1 tablet (25 mg total) by mouth every 6 (six) hours as needed for nausea or vomiting. 08/19/19  Yes Irene Shipper, MD  sucralfate (CARAFATE) 1 g tablet Take 1 tablet (1 g total) by mouth 4 (four) times daily -  with meals and at bedtime. Before taking slowly dissolve tablet in 1 Tablespoon of distilled water for about 15 minutes to creat a slurry. 08/19/19  Yes Irene Shipper, MD  tiZANidine (ZANAFLEX) 4 MG tablet Take 4 mg by mouth every 8 (eight) hours as needed for muscle spasms.   Yes [provider]  vitamin B-12 (CYANOCOBALAMIN) 500 MCG tablet Take 500 mcg by mouth daily.    Yes [provider]  blood glucose meter kit and supplies KIT Dispense based on patient and insurance preference. Use to check sugars up to twice daily. 10/16/19   Caren Macadam, MD  OXYGEN Inhale 2 L into the lungs at bedtime as needed (shortness of breath).     [provider]  predniSONE (DELTASONE) 10 MG tablet Take 4 tablets daily for 4 days then 3 tablets daily for 4 days then 2 tablets daily for 4 days then 1 tablet daily till done. Patient not taking: Reported on 10/30/2019 10/04/19   Georgette Shell, MD    Family History Family History  Problem Relation Age of Onset  . Malignant hyperthermia Father   . Hypertension Father   . Heart disease Father   . Diabetes Father   . Cancer Father        skin  . Hypertension Mother   . Heart disease Mother   . Multiple myeloma Mother   . Cancer Sister         CERVICAL  . Hypertension Sister   . Cancer Brother 22       MELANOMA  . Heart disease Maternal Grandmother   . Other Maternal Grandmother 32       complications of childbirth  . Heart disease Maternal Grandfather   . Cancer Paternal Grandmother        ?   Marland Kitchen Heart disease Paternal Grandmother   . Heart disease Paternal Grandfather   . Cancer Brother        LUNG  . Diabetes Sister   . Hypertension Sister   . Heart disease Sister   . Cancer Sister   . Cancer Brother   . Pancreatic cancer Niece 21  . Cancer Nephew 40       unknown- currently in the [employer]  . Anesthesia problems Neg Hx   . Hypotension Neg Hx   . Pseudochol deficiency Neg Hx   . Colon cancer Neg Hx   . Esophageal cancer Neg Hx   . Rectal cancer Neg Hx   . Stomach cancer Neg Hx   . Breast cancer Neg Hx  Social History Social History   Tobacco Use  . Smoking status: Never Smoker  . Smokeless tobacco: Never Used  Substance Use Topics  . Alcohol use: No    Alcohol/week: 0.0 standard drinks  . Drug use: No     Allergies   Lodine [etodolac], Oxycontin [oxycodone hcl], Penicillins, Aspirin, Darvocet [propoxyphene n-acetaminophen], Nitroglycerin, Tramadol, Ultram [tramadol hcl], and Valium   Review of Systems Review of Systems  Constitutional: Negative for fever.  Respiratory: Positive for cough, shortness of breath and wheezing.   Cardiovascular: Positive for chest pain and leg swelling.  Gastrointestinal: Positive for nausea. Negative for abdominal pain and vomiting.  All other systems reviewed and are negative.    Physical Exam Updated Vital Signs BP 112/67 (BP Location: Left Arm)   Pulse 75   Temp 99.2 F (37.3 C) (Oral)   Resp 18   SpO2 100%   Physical Exam Vitals signs and nursing note reviewed.  Constitutional:      Appearance: She is well-developed.  HENT:     Head: Normocephalic and atraumatic.     Right Ear: External ear normal.     Left Ear: External ear normal.     Nose:  Nose normal.  Eyes:     General:        Right eye: No discharge.        Left eye: No discharge.  Cardiovascular:     Rate and Rhythm: Normal rate and regular rhythm.     Heart sounds: Normal heart sounds.  Pulmonary:     Effort: Pulmonary effort is normal. No tachypnea or accessory muscle usage.     Breath sounds: Wheezing (mild, scattered) and rales (mild, bibasilar) present.  Abdominal:     Palpations: Abdomen is soft.     Tenderness: There is no abdominal tenderness.  Musculoskeletal:     Right lower leg: No edema.     Left lower leg: No edema.  Skin:    General: Skin is warm and dry.  Neurological:     Mental Status: She is alert.  Psychiatric:        Mood and Affect: Mood is not anxious.      ED Treatments / Results  Labs (all labs ordered are listed, but only abnormal results are displayed) Labs Reviewed  COMPREHENSIVE METABOLIC PANEL - Abnormal; Notable for the following components:      Result Value   Potassium 3.4 (*)    CO2 33 (*)    Glucose, Bld 128 (*)    Albumin 3.3 (*)    All other components within normal limits  CBC WITH DIFFERENTIAL/PLATELET - Abnormal; Notable for the following components:   Hemoglobin 11.8 (*)    Neutro Abs 7.9 (*)    All other components within normal limits  URINALYSIS, ROUTINE W REFLEX MICROSCOPIC - Abnormal; Notable for the following components:   Color, Urine STRAW (*)    Leukocytes,Ua SMALL (*)    Bacteria, UA RARE (*)    All other components within normal limits  CULTURE, BLOOD (ROUTINE X 2)  CULTURE, BLOOD (ROUTINE X 2)  URINE CULTURE  SARS CORONAVIRUS 2 (TAT 6-24 HRS)  RESPIRATORY PANEL BY PCR  LACTIC ACID, PLASMA  APTT  PROTIME-INR  BRAIN NATRIURETIC PEPTIDE  LACTIC ACID, PLASMA  BLOOD GAS, VENOUS  LACTATE DEHYDROGENASE  D-DIMER, QUANTITATIVE (NOT AT [facility])  FERRITIN  C-REACTIVE PROTEIN  POC SARS CORONAVIRUS 2 AG -  ED  TROPONIN I (HIGH SENSITIVITY)  TROPONIN I (HIGH SENSITIVITY)  EKG EKG  Interpretation  Date/Time:  Friday October 30 2019 16:05:30 EST Ventricular Rate:  80 PR Interval:    QRS Duration: 108 QT Interval:  398 QTC Calculation: 460 R Axis:   -29 Text Interpretation: Sinus rhythm Borderline left axis deviation Borderline T abnormalities, anterior leads Baseline wander in lead(s) III V5 nonspecific T waves appear similar to Oct 2020 Confirmed by Sherwood Gambler [phone]) on 10/30/2019 4:40:31 PM   Radiology Ct Angio Chest Pe W And/or Wo Contrast  Result Date: 10/30/2019 CLINICAL DATA:  Shortness of breath EXAM: CT ANGIOGRAPHY CHEST WITH CONTRAST TECHNIQUE: Multidetector CT imaging of the chest was performed using the standard protocol during bolus administration of intravenous contrast. Multiplanar CT image reconstructions and MIPs were obtained to evaluate the vascular anatomy. CONTRAST:  131m OMNIPAQUE IOHEXOL 350 MG/ML SOLN COMPARISON:  Chest x-ray October 30, 2019. CTA of the chest October 01, 2019. FINDINGS: Cardiovascular: The thoracic aorta demonstrates no aneurysm or dissection. Coronary artery calcifications are identified involving the LAD. Heart is stable. Evaluation of pulmonary arteries beyond the segmental branches is limited due to respiratory motion. Within this limitation, no pulmonary emboli identified. Mediastinum/Nodes: No enlarged mediastinal, hilar, or axillary lymph nodes. Thyroid gland, trachea, and esophagus demonstrate no significant findings. Lungs/Pleura: Central airways. No pneumothorax. Patchy pulmonary opacities are identified, primarily ground-glass in nature. No consolidation identified. No nodules masses. Upper Abdomen: No acute abnormality. Musculoskeletal: No chest wall abnormality. No acute or significant osseous findings. Review of the MIP images confirms the above findings. IMPRESSION: 1. Scattered ground-glass opacities throughout the lungs may represent mild edema, scattered subsegmental atelectasis, or atypical infection. No  consolidation. 2. No central pulmonary emboli identified. Evaluation beyond the central pulmonary arteries is somewhat limited due to motion but no emboli are noted. 3. Coronary artery calcifications involving the LAD. 4. No other abnormalities. Aortic Atherosclerosis (ICD10-I70.0). Electronically Signed   By: DDorise BullionIII M.D   On: 10/30/2019 21:54   Dg Chest Port 1 View  Result Date: 10/30/2019 CLINICAL DATA:  Shortness of breath EXAM: PORTABLE CHEST 1 VIEW COMPARISON:  10/26/2019 FINDINGS: Cardiomegaly. Implantable loop recorder. Right chest port catheter. Both lungs are clear. The visualized skeletal structures are unremarkable. IMPRESSION: Cardiomegaly without acute abnormality of the lungs in AP portable projection. Electronically Signed   By: AEddie CandleM.D.   On: 10/30/2019 16:40    Procedures Procedures (including critical care time)  Medications Ordered in ED Medications  cefTRIAXone (ROCEPHIN) 1 g in sodium chloride 0.9 % 100 mL IVPB (1 g Intravenous New Bag/Given 10/30/19 2258)  azithromycin (ZITHROMAX) 500 mg in sodium chloride 0.9 % 250 mL IVPB (has no administration in time range)  albuterol (VENTOLIN HFA) 108 (90 Base) MCG/ACT inhaler 4 puff (4 puffs Inhalation Given 10/30/19 1700)  HYDROcodone-acetaminophen (NORCO) 10-325 MG per tablet 1 tablet (1 tablet Oral Given 10/30/19 2113)  iohexol (OMNIPAQUE) 350 MG/ML injection 100 mL (100 mLs Intravenous Contrast Given 10/30/19 2123)     Initial Impression / Assessment and Plan / ED Course  I have reviewed the triage vital signs and the nursing notes.  Pertinent labs & imaging results that were available during my care of the patient were reviewed by me and considered in my medical decision making (see chart for details).        Patient was placed on oxygen for O2 sats of about 85% on room air.  Felt better after albuterol inhaler.  Work-up fairly unremarkable including normal WBC though with a little bit of extra  neutrophils, normal lactate and normal troponins and BNP.  Chest x-ray pretty unrevealing.  Given the hypoxia CT angiography was obtained that shows no PE.  Atypical pneumonia is possibly on there and I think this is more likely than CHF given she has been taking her Lasix at an increased dose, has no peripheral edema, and normal BNP.  Will admit for respiratory failure as despite her feeling better she dropped her sats again when trying to walk and feels more short of breath.  We will give IV antibiotics.  She has tolerated cephalosporins in the past so I will give Rocephin as well as azithromycin.  KHAI TORBERT was evaluated in Emergency Department on 10/30/2019 for the symptoms described in the history of present illness. She was evaluated in the context of the global COVID-19 pandemic, which necessitated consideration that the patient might be at risk for infection with the SARS-CoV-2 virus that causes COVID-19. Institutional protocols and algorithms that pertain to the evaluation of patients at risk for COVID-19 are in a state of rapid change based on information released by regulatory bodies including the CDC and federal and state organizations. These policies and algorithms were followed during the patient's care in the ED.  Final Clinical Impressions(s) / ED Diagnoses   Final diagnoses:  Acute respiratory failure with hypoxia [facility])    ED Discharge Orders    None       Sherwood Gambler, MD 10/30/19 2322

## 2019-10-30 NOTE — H&P (Signed)
Shelby Anderson SWN:[id number] DOB: Jan 27, 1956 DOA: 10/30/2019     PCP: Caren Macadam, MD   Outpatient Specialists:   cardiology Dr. Einar Gip Pulmonary    Dr. Elsworth Soho  Oncology  Dr. Lindi Adie    Patient arrived to ER on 10/30/19 at 1547  Patient coming from: home Lives   With family    Chief Complaint:   Chief Complaint  Patient presents with  . Shortness of Breath    HPI: Shelby Anderson is a 63 y.o. female with medical history significant of asthma COPD and sleep apnea breast cancer currently undergoing chemotherapy, DM2, chronic back pain, paroxysmal atrial fibrillation, peptic ulcer disease, GERD    Presented with worsening shortness of breath for about a week has been having some wheezing as well occasionally feeling somewhat confused she was advised to go to emergency department She was taken some extra Lasix at home to see if that would help with her shortness of breath.  She reported leg swelling left worse than the right but she says she usually accumulates a fluid mainly in her left leg.  She checked her oxygen saturation at home and it was as low 70% room air while walking.  K no nausea no vomiting  currently seeing no fevers.    Recent episode of worsening wheezing associated low-grade fever she had a tentative visit to her pulmonologist on 23 November reporting increased shortness of breath she started to back using oxygen at 2 L which kept her oxygen saturation around 90s she is usually not using oxygen on a regular basis  Last admission was about a month ago for decompensated diastolic heart failure neutropenic fever she at that time was treated with IV Lasix 2D echo during her admission showed normal EF CAT scan showed no evidence of PE  Infectious risk factors:  Reports fever, shortness of breath, dry cough,   In  ER RAPID COVID TEST  in house testing  Pending  Lab Results  Component Value Date   [place] Not Detected 10/27/2019    [place] NEGATIVE 10/01/2019   [place] NOT DETECTED 09/19/2019   [place] Not Detected 05/03/2019     Regarding pertinent Chronic problems:     Hyperlipidemia -   on statins Lipitor 40      Chronic CHF diastolic  - last echo EF 93-81% Lasix 20 mg a day  no left ventricular hypertrophy.    DM 2 -  Lab Results  Component Value Date   HGBA1C 6.8 (H) 09/17/2019     PO meds only,      obesity-   BMI Readings from Last 1 Encounters:  10/16/19 36.64 kg/m       Asthma -poorly controlled on home inhalers/ nebs                          Last  Prior admission last month                       No  history of intubation   followed by pulmonology not on baseline oxygen       OSA -  on CPAP      A. Fib -  CHA2DS2 vas score 2:  current not  on anticoagulation  Per patient  cardiology              While in ER: Increased work of breathing with difficulty completion of sentences Desatted down to 87%  of ambulation  Scattered CT scan showing scattered groundglass opacities no PE Cardiomegaly without acute abnormality    The following Work up has been ordered so far:  Orders Placed This Encounter  Procedures  . Blood Culture (routine x 2)  . Urine culture  . SARS CORONAVIRUS 2 (TAT 6-24 HRS) Nasopharyngeal Nasopharyngeal Swab  . DG Chest Port 1 View  . CT Angio Chest PE W and/or Wo Contrast  . Lactic acid, plasma  . Comprehensive metabolic panel  . CBC WITH DIFFERENTIAL  . APTT  . Protime-INR  . Urinalysis, Routine w reflex microscopic  . Brain natriuretic peptide  . Diet NPO time specified  . If O2 Sat <94% administer O2 at 2 liters/minute via nasal cannula  . Cardiac monitoring  . Refer to Sidebar Report: Sepsis Sidebar ED/IP  . Document vital signs within 1-hour of fluid bolus completion and notify provider of bolus completion  . Document height and weight  . Insert peripheral IV x 2  . Initiate Carrier Fluid Protocol  . Check Pulse Oximetry while ambulating   . Consult to hospitalist  ALL PATIENTS BEING ADMITTED/HAVING PROCEDURES NEED COVID-19 SCREENING  . Airborne and Contact precautions  . Pulse oximetry, continuous  . POC SARS Coronavirus 2 Ag-ED - Nasal Swab (BD Veritor Kit)  . ED EKG     Following Medications were ordered in ER: Medications  cefTRIAXone (ROCEPHIN) 1 g in sodium chloride 0.9 % 100 mL IVPB (has no administration in time range)  azithromycin (ZITHROMAX) 500 mg in sodium chloride 0.9 % 250 mL IVPB (has no administration in time range)  albuterol (VENTOLIN HFA) 108 (90 Base) MCG/ACT inhaler 4 puff (4 puffs Inhalation Given 10/30/19 1700)  HYDROcodone-acetaminophen (NORCO) 10-325 MG per tablet 1 tablet (1 tablet Oral Given 10/30/19 2113)  iohexol (OMNIPAQUE) 350 MG/ML injection 100 mL (100 mLs Intravenous Contrast Given 10/30/19 2123)        Consult Orders  (From admission, onward)         Start     Ordered   10/30/19 2221  Consult to hospitalist  ALL PATIENTS BEING ADMITTED/HAVING PROCEDURES NEED COVID-19 SCREENING  Once    Comments: ALL PATIENTS BEING ADMITTED/HAVING PROCEDURES NEED COVID-19 SCREENING  Provider:  (Not yet assigned)  Question Answer Comment  Place call to: [facility]   Reason for Consult Admit      10/30/19 2220          Significant initial  Findings: Abnormal Labs Reviewed  COMPREHENSIVE METABOLIC PANEL - Abnormal; Notable for the following components:      Result Value   Potassium 3.4 (*)    CO2 33 (*)    Glucose, Bld 128 (*)    Albumin 3.3 (*)    All other components within normal limits  CBC WITH DIFFERENTIAL/PLATELET - Abnormal; Notable for the following components:   Hemoglobin 11.8 (*)    Neutro Abs 7.9 (*)    All other components within normal limits  URINALYSIS, ROUTINE W REFLEX MICROSCOPIC - Abnormal; Notable for the following components:   Color, Urine STRAW (*)    Leukocytes,Ua SMALL (*)    Bacteria, UA RARE (*)    All other components within normal limits      Otherwise labs showing:    Recent Labs  Lab 10/26/19 1610 10/30/19 1635  NA 140 141  K 3.7 3.4*  CO2 28 33*  GLUCOSE 91 128*  BUN 11 11  CREATININE 0.80 0.69  CALCIUM 8.8* 8.9    Cr  stable,  Up from baseline see below Lab Results  Component Value Date   CREATININE 0.69 10/30/2019   CREATININE 0.80 10/26/2019   CREATININE 0.73 10/15/2019    Recent Labs  Lab 10/30/19 1635  AST 32  ALT 28  ALKPHOS 93  BILITOT 0.6  PROT 7.0  ALBUMIN 3.3*   Lab Results  Component Value Date   CALCIUM 8.9 10/30/2019   PHOS 2.3 12/30/2013      WBC      Component Value Date/Time   WBC 9.8 10/30/2019 1635   ANC    Component Value Date/Time   NEUTROABS 7.9 (H) 10/30/2019 1635   ALC No components found for: LYMPHAB    Plt: Lab Results  Component Value Date   PLT 171 10/30/2019     Lactic Acid, Venous    Component Value Date/Time   LATICACIDVEN 1.6 10/30/2019 1635    Procalcitonin   Ordered   COVID-19 Labs  Recent Labs    10/31/19 0002  DDIMER 0.96*  LDH 197*    Lab Results  Component Value Date   SARSCOV2NAA Not Detected 10/27/2019   [place] NEGATIVE 10/01/2019   [place] NOT DETECTED 09/19/2019   [place] Not Detected 05/03/2019      Venous  Blood Gas result: ordered.  ABG    Component Value Date/Time   PHART 7.393 08/14/2011 0501   PCO2ART 44.9 08/14/2011 0501   PO2ART 56.0 (L) 08/14/2011 0501   HCO3 27.3 (H) 08/14/2011 0501   TCO2 29 08/14/2011 0501   O2SAT 88.0 08/14/2011 0501    HG/HCT   stable,       Component Value Date/Time   HGB 11.8 (L) 10/30/2019 1635   HGB 13.5 10/15/2019 1100   HCT 37.9 10/30/2019 1635     Troponin 3-4 ordered Cardiac Panel (last 3 results) No results for input(s): CKTOTAL, CKMB, TROPONINI, RELINDX in the last 72 hours.     ECG: Ordered Personally reviewed by me showing: HR : 80 Rhythm:  NSR    no evidence of ischemic changes QTC 460   BNP (last 3 results) Recent Labs     10/01/19 0113 10/26/19 1610 10/30/19 1635  BNP 88.5 108.0* 54.0     DM  labs:  HbA1C: Recent Labs    12/02/18 1009 04/10/19 0944 09/17/19 0849  HGBA1C 6.5 7.3* 6.8*      UA  no evidence of UTI   Urine analysis:    Component Value Date/Time   COLORURINE STRAW (A) 10/30/2019 [place] 10/30/2019 1635   LABSPEC 1.006 10/30/2019 1635   PHURINE 5.0 10/30/2019 1635   GLUCOSEU NEGATIVE 10/30/2019 1635   GLUCOSEU NEGATIVE 01/05/2013 1535   HGBUR NEGATIVE 10/30/2019 1635   BILIRUBINUR NEGATIVE 10/30/2019 1635   BILIRUBINUR n 10/27/2014 1118   KETONESUR NEGATIVE 10/30/2019 1635   PROTEINUR NEGATIVE 10/30/2019 1635   UROBILINOGEN 0.2 10/27/2014 1118   UROBILINOGEN 1.0 12/23/2013 1344   NITRITE NEGATIVE 10/30/2019 1635   LEUKOCYTESUR SMALL (A) 10/30/2019 1635       Ordered   CXR -  Cardiomegaly   CTA chest -   no PE, ground glass opacities      ED Triage Vitals  Enc Vitals Group     BP 10/30/19 1606 102/61     Pulse Rate 10/30/19 1606 82     Resp 10/30/19 1606 16     Temp 10/30/19 1606 99.2 F (37.3 C)     Temp Source 10/30/19 1606 Oral     SpO2  10/30/19 1606 (!) 85 %     Weight --      Height --      Head Circumference --      Peak Flow --      Pain Score 10/30/19 1602 8     Pain Loc --      Pain Edu? --      Excl. in [place]? --   TMAX(24)@       Latest  Blood pressure 103/65, pulse 78, temperature 99.2 F (37.3 C), temperature source Oral, resp. rate 14, SpO2 91 %.    Hospitalist was called for admission for COPD exacerbation   Review of Systems:    Pertinent positives include:  Fevers,fatigue shortness of breath at rest.  dyspnea on exertion non-productive cough, wheezing. Constitutional:  No weight loss, night sweats, chills, , weight loss  HEENT:  No headaches, Difficulty swallowing,Tooth/dental problems,Sore throat,  No sneezing, itching, ear ache, nasal congestion, post nasal drip,  Cardio-vascular:  No chest pain, Orthopnea, PND,  anasarca, dizziness, palpitations.no Bilateral lower extremity swelling  GI:  No heartburn, indigestion, abdominal pain, nausea, vomiting, diarrhea, change in bowel habits, loss of appetite, melena, blood in stool, hematemesis Resp:    No excess mucus, no productive cough,   No coughing up of blood.No change in color of mucus.No  Skin:  no rash or lesions. No jaundice GU:  no dysuria, change in color of urine, no urgency or frequency. No straining to urinate.  No flank pain.  Musculoskeletal:  No joint pain or no joint swelling. No decreased range of motion. No back pain.  Psych:  No change in mood or affect. No depression or anxiety. No memory loss.  Neuro: no localizing neurological complaints, no tingling, no weakness, no double vision, no gait abnormality, no slurred speech, no confusion  All systems reviewed and apart from [place] all are negative  Past Medical History:   Past Medical History:  Diagnosis Date  . Allergy   . Anemia    "chronic"  . Angina   . Anxiety and depression   . Arthritis   . Asthma   . Atrial fibrillation ([place])    h/o "AF w/frequent PVCs"  . Breast cancer ([place])    Left  . Cancer (HCC)    hx of skin cancer   . CHF (congestive heart failure) ([place])   . Chronic back pain greater than 3 months duration    on chronic narcotics, treated at pain clinic  . Colon polyps    hyperplastic  . Coronary artery disease    Arrythmia, orthostatic hypotension, HLD, HTN; sees Dr. Einar Gip  . Difficult intubation    "TMJ & woke up when they were still cutting on me"  . Dysrhythmia    sees Dr. Einar Gip and a cardiologist at [facility]  . Esophageal stricture   . Family history of melanoma ]  . Family history of pancreatic cancer   . Fatty liver   . Fibroids   . Fibromyalgia    "in my legs"  . GERD (gastroesophageal reflux disease)    hx hiatal hernia, stricture and gastric ulcer  . Headache(784.0)   . Heart murmur   . Hiatal hernia   . History of loop recorder    . History of migraines    "dx'd when I was in my teens"  . Hyperlipemia   . Hypertension   . Mental disorder   . Mild episode of recurrent major depressive disorder ([place]) 12/06/2015  . Myocardial infarction (  [place]) 1980's & 1990;   sees Dr. Einar Gip  . OSA (obstructive sleep apnea)   . Pneumonia   . PONV (postoperative nausea and vomiting)   . Recurrent upper respiratory infection (URI)   . Shortness of breath 11/20/11   "all the time", sees pulmonlogy, ? asthma  . Stenotic cervical os   . Stomach ulcer    "3 small; found in 05/2011"  . TMJ (dislocation of temporomandibular joint)   . Tuberculosis    + TB SKIN TEST  . Type 2 diabetes mellitus without complication, without long-term current use of insulin ([place]) 12/06/2015   not on meds       Past Surgical History:  Procedure Laterality Date  . ACHILLES TENDON REPAIR  1970's   left ankle  . ARTHROSCOPIC REPAIR ACL     left knee cap  . BREAST BIOPSY Right 04/08/2013  . CARDIAC CATHETERIZATION     loop recorder  . CARPAL TUNNEL RELEASE  unknown   left hand  . ESOPHAGOGASTRODUODENOSCOPY (EGD) WITH PROPOFOL N/A 10/29/2017   Procedure: ESOPHAGOGASTRODUODENOSCOPY (EGD) WITH PROPOFOL;  Surgeon: Irene Shipper, MD;  Location: [facility];  Service: Endoscopy;  Laterality: N/A;  . LOOP RECORDER IMPLANT    . PORTACATH PLACEMENT N/A 09/23/2019   Procedure: INSERTION PORT-A-CATH Right Internal Doreen Salvage;  Surgeon: Rolm Bookbinder, MD;  Location: [facility];  Service: General;  Laterality: N/A;  . post ganglionectomy  1970's   "for migraine headaches"  . pouch string  [phone]   "did this 3 times (once w/each pregnancy)"  . SAVORY DILATION N/A 10/29/2017   Procedure: SAVORY DILATION;  Surgeon: Irene Shipper, MD;  Location: [facility];  Service: Endoscopy;  Laterality: N/A;  . TOTAL KNEE ARTHROPLASTY Left 09/25/2016   Procedure: LEFT TOTAL KNEE ARTHROPLASTY;  Surgeon: Paralee Cancel, MD;  Location: [facility];  Service: Orthopedics;   Laterality: Left;  . TUBAL LIGATION  1980's    Social History:  Ambulatory independently cane,      reports that she has never smoked. She has never used smokeless tobacco. She reports that she does not drink alcohol or use drugs.     Family History:   Family History  Problem Relation Age of Onset  . Malignant hyperthermia Father   . Hypertension Father   . Heart disease Father   . Diabetes Father   . Cancer Father        skin  . Hypertension Mother   . Heart disease Mother   . Multiple myeloma Mother   . Cancer Sister        CERVICAL  . Hypertension Sister   . Cancer Brother 22       MELANOMA  . Heart disease Maternal Grandmother   . Other Maternal Grandmother 32       complications of childbirth  . Heart disease Maternal Grandfather   . Cancer Paternal Grandmother        ?   Marland Kitchen Heart disease Paternal Grandmother   . Heart disease Paternal Grandfather   . Cancer Brother        LUNG  . Diabetes Sister   . Hypertension Sister   . Heart disease Sister   . Cancer Sister   . Cancer Brother   . Pancreatic cancer Niece 66  . Cancer Nephew 40       unknown- currently in the [employer]  . Anesthesia problems Neg Hx   . Hypotension Neg Hx   . Pseudochol deficiency Neg Hx   .  Colon cancer Neg Hx   . Esophageal cancer Neg Hx   . Rectal cancer Neg Hx   . Stomach cancer Neg Hx   . Breast cancer Neg Hx     Allergies: Allergies  Allergen Reactions  . Lodine [Etodolac] Anaphylaxis, Hives and Swelling  . Oxycontin [Oxycodone Hcl] Anaphylaxis    hives, trouble breathing, tongue swelling (Only Oxycontin) Tolerates plain oxycodone.  Marland Kitchen Penicillins Anaphylaxis    Told by a surgeon never to take it again. Has patient had a PCN reaction causing immediate rash, facial/tongue/throat swelling, SOB or lightheadedness with hypotension: Yes Has patient had a PCN reaction causing severe rash involving mucus membranes or skin necrosis: Unknown Has patient had a PCN reaction that  required hospitalization: No Has patient had a PCN reaction occurring within the last 10 years: No If all of the above answers are "NO", then may proceed with Cephalosporin use.  . Aspirin Other (See Comments)    High-dose caused GI Bleeds  . Darvocet [Propoxyphene N-Acetaminophen] Hives  . Nitroglycerin     IV-BP drops dramatically  . Tramadol Hives and Itching  . Ultram [Tramadol Hcl] Hives  . Valium Other (See Comments)    Circulation problems. "Legs turned black".     Prior to Admission medications   Medication Sig Start Date End Date Taking? Authorizing Provider  albuterol (PROVENTIL) (2.5 MG/3ML) 0.083% nebulizer solution Take 3 mLs (2.5 mg total) by nebulization every 6 (six) hours as needed for wheezing or shortness of breath (J45.40). 09/19/16  Yes Javier Glazier, MD  albuterol (VENTOLIN HFA) 108 (90 Base) MCG/ACT inhaler Inhale 2 puffs into the lungs every 6 (six) hours as needed for wheezing or shortness of breath. 09/07/19  Yes Rigoberto Noel, MD  aspirin EC 81 MG tablet Take 81 mg at bedtime by mouth.    Yes [provider]  atorvastatin (LIPITOR) 40 MG tablet TAKE 1 TABLET BY MOUTH EVERY DAY Patient taking differently: Take 40 mg by mouth daily.  09/16/19  Yes Colin Benton R, DO  budesonide-formoterol (SYMBICORT) 80-4.5 MCG/ACT inhaler INHALE 2 PUFFS INTO THE LUNGS EVERY MORNING AND ANOTHER 2 PUFFS 12 HOURS LATER Patient taking differently: Inhale 2 puffs into the lungs 2 (two) times daily.  12/16/18  Yes Martyn Ehrich, NP  CINNAMON PO Take 1,000 mg 2 (two) times daily by mouth.   Yes [provider]  clobetasol cream (TEMOVATE) 0.05 % Apply topically 2 (two) times daily. 10/04/19  Yes Georgette Shell, MD  doxepin (SINEQUAN) 10 MG capsule Take 20 mg by mouth at bedtime.  03/27/19  Yes [provider]  esomeprazole (NEXIUM) 40 MG capsule Take 1 capsule (40 mg total) by mouth 2 (two) times daily. 08/19/19  Yes Irene Shipper, MD  fluticasone  [facility]) 50 MCG/ACT nasal spray Place 2 sprays at bedtime as needed into both nostrils for allergies or rhinitis.    Yes [provider]  furosemide (LASIX) 20 MG tablet 1-2 daily as directed. Patient taking differently: Take 20-40 mg by mouth See admin instructions. Take 58m daily and may take another 275mwhen directed from doctor. 10/26/19  Yes Parrett, Tammy S, NP  glipiZIDE (GLUCOTROL XL) 5 MG 24 hr tablet TAKE 1 TABLET BY MOUTH EVERY DAY WITH BREAKFAST Patient taking differently: Take 5 mg by mouth daily with breakfast.  06/22/19  Yes KiLucretia KernDO  HYDROcodone-acetaminophen (NORCO) 10-325 MG tablet Take 1 tablet by mouth every 6 (six) hours as needed. Patient taking differently: Take  1 tablet by mouth every 6 (six) hours as needed for moderate pain.  09/23/19 09/22/20 Yes Rolm Bookbinder, MD  L-Methylfolate (DEPLIN) 7.5 MG TABS Take 7.5 mg by mouth daily with breakfast.    Yes [provider]  lidocaine-prilocaine (EMLA) cream Apply to affected area once Patient taking differently: Apply 1 application topically daily as needed (numbing).  09/10/19  Yes Nicholas Lose, MD  LORazepam (ATIVAN) 0.5 MG tablet Take 1 tablet (0.5 mg total) by mouth at bedtime as needed for sleep. 09/10/19  Yes Nicholas Lose, MD  LYRICA 150 MG capsule Take 150 mg by mouth 3 (three) times daily.  05/28/18  Yes [provider]  metFORMIN (GLUCOPHAGE) 500 MG tablet TAKE 1 TABLET (500 MG TOTAL) BY MOUTH 2 (TWO) TIMES DAILY WITH A MEAL. 09/28/19  Yes Koberlein, Steele Berg, MD  Misc Natural Products (GLUCOS-CHONDROIT-MSM COMPLEX PO) Take 1 tablet 3 (three) times daily by mouth.    Yes [provider]  mometasone (ELOCON) 0.1 % cream Apply 1 application topically daily as needed (irritation).  05/17/19  Yes [provider]  montelukast (SINGULAIR) 10 MG tablet TAKE 1 TABLET BY MOUTH EVERYDAY AT BEDTIME Patient taking differently: Take 10 mg by mouth at bedtime.  08/13/19  Yes  Rigoberto Noel, MD  Multiple Vitamin (MULITIVITAMIN WITH MINERALS) TABS Take 1 tablet by mouth daily.     Yes [provider]  nitroGLYCERIN (NITROSTAT) 0.4 MG SL tablet Place 0.4 mg under the tongue every 5 (five) minutes as needed for chest pain.    Yes [provider]  ondansetron (ZOFRAN) 8 MG tablet Take 1 tablet (8 mg total) by mouth 2 (two) times daily as needed for refractory nausea / vomiting. Start on day 3 after chemo. 09/10/19  Yes Nicholas Lose, MD  Oxycodone HCl 20 MG TABS Take 20 mg every 4 (four) hours as needed by mouth (pain).  08/09/17  Yes [provider]  potassium chloride (K-DUR) 10 MEQ tablet TAKE 2 TABLETS BY MOUTH EVERY DAY Patient taking differently: Take 20 mEq by mouth daily.  07/23/19  Yes Lucretia Kern, DO  prochlorperazine (COMPAZINE) 10 MG tablet Take 1 tablet (10 mg total) by mouth every 6 (six) hours as needed (Nausea or vomiting). 09/10/19  Yes Nicholas Lose, MD  promethazine (PHENERGAN) 25 MG tablet Take 1 tablet (25 mg total) by mouth every 6 (six) hours as needed for nausea or vomiting. 08/19/19  Yes Irene Shipper, MD  sucralfate (CARAFATE) 1 g tablet Take 1 tablet (1 g total) by mouth 4 (four) times daily -  with meals and at bedtime. Before taking slowly dissolve tablet in 1 Tablespoon of distilled water for about 15 minutes to creat a slurry. 08/19/19  Yes Irene Shipper, MD  tiZANidine (ZANAFLEX) 4 MG tablet Take 4 mg by mouth every 8 (eight) hours as needed for muscle spasms.   Yes [provider]  vitamin B-12 (CYANOCOBALAMIN) 500 MCG tablet Take 500 mcg by mouth daily.    Yes [provider]  blood glucose meter kit and supplies KIT Dispense based on patient and insurance preference. Use to check sugars up to twice daily. 10/16/19   Caren Macadam, MD  OXYGEN Inhale 2 L into the lungs at bedtime as needed (shortness of breath).     [provider]  predniSONE (DELTASONE) 10 MG tablet Take 4 tablets daily  for 4 days then 3 tablets daily for 4 days then 2 tablets daily for 4 days  then 1 tablet daily till done. Patient not taking: Reported on 10/30/2019 10/04/19   Georgette Shell, MD   Physical Exam: Blood pressure 103/65, pulse 78, temperature 99.2 F (37.3 C), temperature source Oral, resp. rate 14, SpO2 91 %. 1. General:  in No  Acute distress    Chronically ill  -appearing 2. Psychological: Alert and  Oriented 3. Head/ENT:    Dry Mucous Membranes                          Head Non traumatic, neck supple                            Poor Dentition 4. SKIN:  decreased Skin turgor,  Skin clean Dry and intact no rash 5. Heart: Regular rate and rhythm no  Murmur, no Rub or gallop 6. Lungs: Occasional wheezes or crackles   7. Abdomen: Soft,  non-tender, Non distended  obese  bowel sounds present 8. Lower extremities: no clubbing, cyanosis, left worse than the right edema 9. Neurologically Grossly intact, moving all 4 extremities equally   10. MSK: Normal range of motion   All other LABS:     Recent Labs  Lab 10/26/19 1610 10/30/19 1635  WBC 20.5* 9.8  NEUTROABS 16.5* 7.9*  HGB 12.4 11.8*  HCT 39.5 37.9  MCV 92.7 94.0  PLT 149* 171     Recent Labs  Lab 10/26/19 1610 10/30/19 1635  NA 140 141  K 3.7 3.4*  CL 100 98  CO2 28 33*  GLUCOSE 91 128*  BUN 11 11  CREATININE 0.80 0.69  CALCIUM 8.8* 8.9     Recent Labs  Lab 10/30/19 1635  AST 32  ALT 28  ALKPHOS 93  BILITOT 0.6  PROT 7.0  ALBUMIN 3.3*      Cultures:    Component Value Date/Time   SDES  10/01/2019 0052    BLOOD LEFT FOREARM Performed at [facility] [address] [place], [place] [address]    SDES  10/01/2019 0052    URINE, CLEAN CATCH Performed at [facility], [place] [address], [place], [place] [address]    [place]  10/01/2019 0052    BOTTLES DRAWN AEROBIC AND ANAEROBIC Blood Culture adequate volume Performed at [facility] [address], [place], [place] [address]    SPECREQUEST  10/01/2019 7096    NONE Performed at [facility], [place] [address], [place], [place] [address]    CULT  10/01/2019 0052    NO GROWTH 5 DAYS Performed at [facility], [place] [address], [place], [place] [address]    CULT (A) 10/01/2019 0052    <10,000 COLONIES/mL INSIGNIFICANT GROWTH MULTIPLE SPECIES PRESENT, SUGGEST RECOLLECTION   REPTSTATUS 10/06/2019 FINAL 10/01/2019 0052   REPTSTATUS 10/02/2019 FINAL 10/01/2019 0052     Radiological Exams on Admission: Ct Angio Chest Pe W And/or Wo Contrast  Result Date: 10/30/2019 CLINICAL DATA:  Shortness of breath EXAM: CT ANGIOGRAPHY CHEST WITH CONTRAST TECHNIQUE: Multidetector CT imaging of the chest was performed using the standard protocol during bolus administration of intravenous contrast. Multiplanar CT image reconstructions and MIPs were obtained to evaluate the vascular anatomy. CONTRAST:  184m OMNIPAQUE IOHEXOL 350 MG/ML SOLN COMPARISON:  Chest x-ray October 30, 2019. CTA of the chest October 01, 2019. FINDINGS: Cardiovascular: The thoracic aorta demonstrates no aneurysm or dissection. Coronary artery calcifications are  identified involving the LAD. Heart is stable. Evaluation of pulmonary arteries beyond the segmental branches is limited due to respiratory motion. Within this limitation, no pulmonary emboli identified. Mediastinum/Nodes: No enlarged mediastinal, hilar, or axillary lymph nodes. Thyroid gland, trachea, and esophagus demonstrate no significant findings. Lungs/Pleura: Central airways. No pneumothorax. Patchy pulmonary opacities are identified, primarily ground-glass in nature. No consolidation identified. No nodules masses. Upper Abdomen: No acute abnormality. Musculoskeletal: No chest wall abnormality. No acute or significant osseous findings. Review of the MIP images confirms the above findings. IMPRESSION: 1. Scattered ground-glass opacities throughout the lungs  may represent mild edema, scattered subsegmental atelectasis, or atypical infection. No consolidation. 2. No central pulmonary emboli identified. Evaluation beyond the central pulmonary arteries is somewhat limited due to motion but no emboli are noted. 3. Coronary artery calcifications involving the LAD. 4. No other abnormalities. Aortic Atherosclerosis (ICD10-I70.0). Electronically Signed   By: Dorise Bullion III M.D   On: 10/30/2019 21:54   Dg Chest Port 1 View  Result Date: 10/30/2019 CLINICAL DATA:  Shortness of breath EXAM: PORTABLE CHEST 1 VIEW COMPARISON:  10/26/2019 FINDINGS: Cardiomegaly. Implantable loop recorder. Right chest port catheter. Both lungs are clear. The visualized skeletal structures are unremarkable. IMPRESSION: Cardiomegaly without acute abnormality of the lungs in AP portable projection. Electronically Signed   By: Eddie Candle M.D.   On: 10/30/2019 16:40    Chart has been reviewed    Assessment/Plan   63 y.o. female with medical history significant of asthma COPD and sleep apnea breast cancer currently undergoing chemotherapy, DM2, chronic back pain, paroxysmal atrial fibrillation, peptic ulcer disease, GERD  Admitted for COPD exacerbation versus atypical bronchitis  Present on Admission: .  Marland Kitchen Acute respiratory failure with hypoxia ([place])- multifactorial in nature with underlining asthma-like illness in chronic diastolic CHF although at this point does not appear to be fluid overloaded.    Given abnormal CT scan imaging repeat Covid testing Continue as PUI admission until negative Obtain respiratory panel Patient reports in the past have been responsive to steroids given possibility of asthma exacerbations will initiate steroid taper   Possible bronchitis versus atypical infection -patient has responded to steroids in the past will restart steroid taper.,  For normal azithromycin patient received a dose of Rocephin in the emergency department.  Patient has  anaphylactic reaction to penicillins.  Will observe overnight prior to challenge and again with another dose of Rocephin at this point appears to have tolerated well  . Hypertension associated with diabetes ([place]) -stable  . Hyperlipidemia associated with type 2 diabetes mellitus ([place]) -stable continue home medications  . Coronary artery disease -no chest pain troponin unremarkable no ischemic changes on EKG continue statin and baby aspirin  . OSA (obstructive sleep apnea) -hold off on CPAP while being evaluated for Covid  . GERD (gastroesophageal reflux disease) -chronic stable continue home medications  . Chronic back pain continue home medications  . Morbid obesity due to excess calories [facility]) -will need nutrition consult as an outpatient   . Chronic diastolic CHF (congestive heart failure) ([facility]) currently appears to be euvolemic we'll continue to monitor continue Lasix at home dosage for now  Isolated left lower extremity swelling will obtain Doppler to further evaluate  History of breast cancer -will notify oncology the patient has been admitted at this point no evidence of neutropenia  Dm 2-  - Order Sensitive  SSI     -  check TSH and HgA1C  - Hold by mouth medications  Reported history of  atrial fibrillation currently appears to be in sinus rhythm.  Patient states she is not on anticoagulation and been followed by cardiology   Other plan as per orders.  DVT prophylaxis:    Lovenox     Code Status:  FULL CODE as per patient  I had personally discussed CODE STATUS with patient   Family Communication:   Family not at  Bedside    Disposition Plan:   To home once workup is complete and patient is stable                     Would benefit from PT/OT eval prior to DC  Ordered                                       Consults called: pulmonology aware will see in consult in AM  Notified primary oncologist that pt has been admitted    Admission status:  ED Disposition     None        inpatient     Expect 2 midnight stay secondary to severity of patient's current illness including   hemodynamic instability despite optimal treatment (  hypoxia   )   Severe lab/radiological/exam abnormalities including:   Hypoxia,  abnormal CT scan of the lungs   and extensive comorbidities including:  Chronic pain  DM2   CHF  CAD  COPD/asthma  Morbid Obesity   malignancy,   That are currently affecting medical management.   I expect  patient to be hospitalized for 2 midnights requiring inpatient medical care.  Patient is at high risk for adverse outcome (such as loss of life or disability) if not treated.  Indication for inpatient stay as follows:  Severe change from baseline regarding mental status Hemodynamic instability despite maximal medical therapy,      New or worsening hypoxia  Need for IV antibiotics,      Level of care     tele  For 24H      Precautions:   Airborne and Contact precautions  PPE: Used by the provider:   P100  eye Goggles,  Gloves  gown   Jacque Garrels 10/31/2019, 1:04 AM    [facility]     after 2 AM please page floor coverage PA If 7AM-7PM, please contact the day team taking care of the patient using [email or web address]

## 2019-10-30 NOTE — ED Notes (Signed)
Patient in with X-ray

## 2019-10-30 NOTE — Telephone Encounter (Signed)
Received call from pt stating she has been experiencing SHOB x 1 week.  Pt states her cardiologist is not able to see her for 3-4 months.  Pt states her pulmonologist instructed her to take one extra lasix tablet a day x2 days.  Pt states no improvement in symptoms.  Pt short of breath and wheezing while talking on the phone.  Pt also states she is feeling more confused than usual as well.  Per MD. Pt advised to go to the emergency department for future workup.  Pt verbalized understanding.

## 2019-10-30 NOTE — ED Triage Notes (Signed)
Pt presents with c/o shortness of breath for 6 days. Pt is a breast cancer patient, last chemo was 11/12. Pt reports she was here on 11/23 and given some Lasix. Pt reports today she started feeling very short of breath. Pt is audibly wheezing and is having a hard time completing her sentences because of her shortness of breath.

## 2019-10-30 NOTE — ED Notes (Signed)
Patient transported to CT 

## 2019-10-30 NOTE — ED Notes (Signed)
Pt ambulated and desated to 87% during as well as some in/ex wheezing.

## 2019-10-31 ENCOUNTER — Inpatient Hospital Stay (HOSPITAL_COMMUNITY): Payer: Medicare Other

## 2019-10-31 ENCOUNTER — Encounter (HOSPITAL_COMMUNITY): Payer: Self-pay

## 2019-10-31 DIAGNOSIS — R609 Edema, unspecified: Secondary | ICD-10-CM

## 2019-10-31 LAB — CBC WITH DIFFERENTIAL/PLATELET
Abs Immature Granulocytes: 0.12 10*3/uL — ABNORMAL HIGH (ref 0.00–0.07)
Basophils Absolute: 0.1 10*3/uL (ref 0.0–0.1)
Basophils Relative: 1 %
Eosinophils Absolute: 0 10*3/uL (ref 0.0–0.5)
Eosinophils Relative: 0 %
HCT: 38 % (ref 36.0–46.0)
Hemoglobin: 11.5 g/dL — ABNORMAL LOW (ref 12.0–15.0)
Immature Granulocytes: 1 %
Lymphocytes Relative: 7 %
Lymphs Abs: 0.9 10*3/uL (ref 0.7–4.0)
MCH: 28.3 pg (ref 26.0–34.0)
MCHC: 30.3 g/dL (ref 30.0–36.0)
MCV: 93.6 fL (ref 80.0–100.0)
Monocytes Absolute: 0.7 10*3/uL (ref 0.1–1.0)
Monocytes Relative: 6 %
Neutro Abs: 10.3 10*3/uL — ABNORMAL HIGH (ref 1.7–7.7)
Neutrophils Relative %: 85 %
Platelets: 178 10*3/uL (ref 150–400)
RBC: 4.06 MIL/uL (ref 3.87–5.11)
RDW: 15.4 % (ref 11.5–15.5)
WBC: 12.1 10*3/uL — ABNORMAL HIGH (ref 4.0–10.5)
nRBC: 0 % (ref 0.0–0.2)

## 2019-10-31 LAB — LACTIC ACID, PLASMA: Lactic Acid, Venous: 1.5 mmol/L (ref 0.5–1.9)

## 2019-10-31 LAB — RESPIRATORY PANEL BY PCR

## 2019-10-31 LAB — PHOSPHORUS: Phosphorus: 4.4 mg/dL (ref 2.5–4.6)

## 2019-10-31 LAB — BLOOD GAS, VENOUS
Acid-Base Excess: 1.5 mmol/L (ref 0.0–2.0)
Bicarbonate: 27.1 mmol/L (ref 20.0–28.0)
O2 Saturation: 79.2 %
Patient temperature: 98.6
pCO2, Ven: 47.4 mmHg (ref 44.0–60.0)
pH, Ven: 7.375 (ref 7.250–7.430)
pO2, Ven: 47.5 mmHg — ABNORMAL HIGH (ref 32.0–45.0)

## 2019-10-31 LAB — GLUCOSE, CAPILLARY
Glucose-Capillary: 82 mg/dL (ref 70–99)
Glucose-Capillary: 134 mg/dL — ABNORMAL HIGH (ref 70–99)
Glucose-Capillary: 162 mg/dL — ABNORMAL HIGH (ref 70–99)
Glucose-Capillary: 151 mg/dL — ABNORMAL HIGH (ref 70–99)
Glucose-Capillary: 190 mg/dL — ABNORMAL HIGH (ref 70–99)
Glucose-Capillary: 204 mg/dL — ABNORMAL HIGH (ref 70–99)

## 2019-10-31 LAB — D-DIMER, QUANTITATIVE: D-Dimer, Quant: 0.96 ug/mL-FEU — ABNORMAL HIGH (ref 0.00–0.50)

## 2019-10-31 LAB — PROCALCITONIN: Procalcitonin: <0.1 ng/mL

## 2019-10-31 LAB — FERRITIN: Ferritin: 268 ng/mL (ref 11–307)

## 2019-10-31 LAB — TSH: TSH: 2.35 u[IU]/mL (ref 0.350–4.500)

## 2019-10-31 LAB — HEMOGLOBIN A1C
Hgb A1c MFr Bld: 7.7 % — ABNORMAL HIGH (ref 4.8–5.6)
Mean Plasma Glucose: 174.29 mg/dL

## 2019-10-31 LAB — SARS CORONAVIRUS 2 (TAT 6-24 HRS): SARS Coronavirus 2: NEGATIVE

## 2019-10-31 LAB — MAGNESIUM: Magnesium: 1.6 mg/dL — ABNORMAL LOW (ref 1.7–2.4)

## 2019-10-31 LAB — LACTATE DEHYDROGENASE: LDH: 197 U/L — ABNORMAL HIGH (ref 98–192)

## 2019-10-31 LAB — C-REACTIVE PROTEIN: CRP: 4.1 mg/dL — ABNORMAL HIGH (ref <1.0)

## 2019-10-31 MED ORDER — PANTOPRAZOLE SODIUM 40 MG PO TBEC
40.0000 mg | DELAYED_RELEASE_TABLET | Freq: Every day | ORAL | Status: DC
  Administered 2019-10-31 – 2019-11-02 (×3): 40 mg via ORAL
  Filled 2019-10-31 (×3): qty 1

## 2019-10-31 MED ORDER — LORAZEPAM 0.5 MG PO TABS: 0.5000 mg | ORAL_TABLET | Freq: Every evening | ORAL | Status: DC | PRN

## 2019-10-31 MED ORDER — DOXEPIN HCL 10 MG PO CAPS
20.0000 mg | ORAL_CAPSULE | Freq: Every day | ORAL | Status: DC
  Administered 2019-10-31 – 2019-11-02 (×3): 20 mg via ORAL
  Filled 2019-10-31 (×4): qty 2

## 2019-10-31 MED ORDER — SODIUM CHLORIDE 0.9% FLUSH: 10.0000 mL | INTRAVENOUS | Status: DC | PRN

## 2019-10-31 MED ORDER — HYDROCODONE-ACETAMINOPHEN 10-325 MG PO TABS
1.0000 | ORAL_TABLET | Freq: Four times a day (QID) | ORAL | Status: DC | PRN
  Administered 2019-10-31 (×2): 1 via ORAL
  Filled 2019-10-31 (×2): qty 1

## 2019-10-31 MED ORDER — FUROSEMIDE 40 MG PO TABS
40.0000 mg | ORAL_TABLET | Freq: Every day | ORAL | Status: DC
  Administered 2019-10-31 – 2019-11-01 (×2): 40 mg via ORAL
  Filled 2019-10-31 (×2): qty 1

## 2019-10-31 MED ORDER — ONDANSETRON HCL 4 MG PO TABS: 4.0000 mg | ORAL_TABLET | Freq: Four times a day (QID) | ORAL | Status: DC | PRN

## 2019-10-31 MED ORDER — INSULIN ASPART 100 UNIT/ML ~~LOC~~ SOLN
0.0000 [IU] | SUBCUTANEOUS | Status: DC
  Administered 2019-10-31: 3 [IU] via SUBCUTANEOUS
  Administered 2019-10-31 (×2): 2 [IU] via SUBCUTANEOUS
  Administered 2019-10-31: 1 [IU] via SUBCUTANEOUS
  Administered 2019-10-31: 2 [IU] via SUBCUTANEOUS
  Administered 2019-11-01: 3 [IU] via SUBCUTANEOUS
  Administered 2019-11-01: 2 [IU] via SUBCUTANEOUS
  Administered 2019-11-01: 3 [IU] via SUBCUTANEOUS
  Administered 2019-11-01: 2 [IU] via SUBCUTANEOUS
  Administered 2019-11-02: 1 [IU] via SUBCUTANEOUS
  Administered 2019-11-02: 2 [IU] via SUBCUTANEOUS
  Administered 2019-11-02: 3 [IU] via SUBCUTANEOUS
  Administered 2019-11-02 – 2019-11-03 (×2): 2 [IU] via SUBCUTANEOUS

## 2019-10-31 MED ORDER — METHYLPREDNISOLONE SODIUM SUCC 40 MG IJ SOLR
40.0000 mg | Freq: Two times a day (BID) | INTRAMUSCULAR | Status: AC
  Administered 2019-10-31 (×2): 40 mg via INTRAVENOUS
  Filled 2019-10-31 (×2): qty 1

## 2019-10-31 MED ORDER — AZITHROMYCIN 250 MG PO TABS
500.0000 mg | ORAL_TABLET | Freq: Every day | ORAL | Status: DC
  Administered 2019-11-01 – 2019-11-02 (×2): 500 mg via ORAL
  Filled 2019-10-31 (×2): qty 2

## 2019-10-31 MED ORDER — SODIUM CHLORIDE 0.9% FLUSH
3.0000 mL | Freq: Two times a day (BID) | INTRAVENOUS | Status: DC
  Administered 2019-10-31 – 2019-11-03 (×4): 3 mL via INTRAVENOUS

## 2019-10-31 MED ORDER — PREDNISONE 20 MG PO TABS
40.0000 mg | ORAL_TABLET | Freq: Every day | ORAL | Status: DC
  Administered 2019-11-01 – 2019-11-03 (×3): 40 mg via ORAL
  Filled 2019-10-31 (×3): qty 2

## 2019-10-31 MED ORDER — SODIUM CHLORIDE 0.9% FLUSH
3.0000 mL | Freq: Two times a day (BID) | INTRAVENOUS | Status: DC
  Administered 2019-11-02 – 2019-11-03 (×2): 3 mL via INTRAVENOUS

## 2019-10-31 MED ORDER — CYANOCOBALAMIN 500 MCG PO TABS
500.0000 ug | ORAL_TABLET | Freq: Every day | ORAL | Status: DC
  Administered 2019-10-31 – 2019-11-03 (×4): 500 ug via ORAL
  Filled 2019-10-31 (×6): qty 1

## 2019-10-31 MED ORDER — SODIUM CHLORIDE 0.9% FLUSH: 3.0000 mL | INTRAVENOUS | Status: DC | PRN

## 2019-10-31 MED ORDER — SODIUM CHLORIDE 0.9 % IV SOLN
500.0000 mg | INTRAVENOUS | Status: AC
  Administered 2019-10-31: 500 mg via INTRAVENOUS
  Filled 2019-10-31 (×3): qty 500

## 2019-10-31 MED ORDER — CHLORHEXIDINE GLUCONATE CLOTH 2 % EX PADS
6.0000 | MEDICATED_PAD | Freq: Every day | CUTANEOUS | Status: DC
  Administered 2019-10-31 – 2019-11-03 (×4): 6 via TOPICAL

## 2019-10-31 MED ORDER — ENOXAPARIN SODIUM 40 MG/0.4ML ~~LOC~~ SOLN
40.0000 mg | Freq: Every day | SUBCUTANEOUS | Status: DC
  Administered 2019-10-31 – 2019-11-03 (×4): 40 mg via SUBCUTANEOUS
  Filled 2019-10-31 (×4): qty 0.4

## 2019-10-31 MED ORDER — SUCRALFATE 1 G PO TABS
1.0000 g | ORAL_TABLET | Freq: Three times a day (TID) | ORAL | Status: DC
  Administered 2019-10-31 – 2019-11-03 (×15): 1 g via ORAL
  Filled 2019-10-31 (×15): qty 1

## 2019-10-31 MED ORDER — MONTELUKAST SODIUM 10 MG PO TABS
10.0000 mg | ORAL_TABLET | Freq: Every day | ORAL | Status: DC
  Administered 2019-10-31 – 2019-11-02 (×3): 10 mg via ORAL
  Filled 2019-10-31 (×3): qty 1

## 2019-10-31 MED ORDER — MOMETASONE FURO-FORMOTEROL FUM 100-5 MCG/ACT IN AERO: 2.0000 | INHALATION_SPRAY | Freq: Two times a day (BID) | RESPIRATORY_TRACT | Status: DC

## 2019-10-31 MED ORDER — ASPIRIN EC 81 MG PO TBEC
81.0000 mg | DELAYED_RELEASE_TABLET | Freq: Every day | ORAL | Status: DC
  Administered 2019-10-31 – 2019-11-02 (×3): 81 mg via ORAL
  Filled 2019-10-31 (×3): qty 1

## 2019-10-31 MED ORDER — SODIUM CHLORIDE 0.9 % IV SOLN: 250.0000 mL | INTRAVENOUS | Status: DC | PRN

## 2019-10-31 MED ORDER — PREGABALIN 75 MG PO CAPS
150.0000 mg | ORAL_CAPSULE | Freq: Three times a day (TID) | ORAL | Status: DC
  Administered 2019-10-31 – 2019-11-03 (×11): 150 mg via ORAL
  Filled 2019-10-31 (×11): qty 2

## 2019-10-31 MED ORDER — OXYCODONE HCL 5 MG PO TABS
20.0000 mg | ORAL_TABLET | ORAL | Status: DC | PRN
  Administered 2019-10-31 – 2019-11-03 (×10): 20 mg via ORAL
  Filled 2019-10-31 (×10): qty 4

## 2019-10-31 MED ORDER — ATORVASTATIN CALCIUM 40 MG PO TABS
40.0000 mg | ORAL_TABLET | Freq: Every day | ORAL | Status: DC
  Administered 2019-10-31 – 2019-11-03 (×4): 40 mg via ORAL
  Filled 2019-10-31 (×4): qty 1

## 2019-10-31 MED ORDER — ALBUTEROL SULFATE HFA 108 (90 BASE) MCG/ACT IN AERS: 2.0000 | INHALATION_SPRAY | Freq: Four times a day (QID) | RESPIRATORY_TRACT | Status: DC | PRN

## 2019-10-31 MED ORDER — MOMETASONE FURO-FORMOTEROL FUM 200-5 MCG/ACT IN AERO
1.0000 | INHALATION_SPRAY | Freq: Two times a day (BID) | RESPIRATORY_TRACT | Status: DC
  Administered 2019-10-31 – 2019-11-03 (×7): 1 via RESPIRATORY_TRACT
  Filled 2019-10-31: qty 8.8

## 2019-10-31 MED ORDER — SALINE SPRAY 0.65 % NA SOLN
1.0000 | NASAL | Status: DC | PRN
  Filled 2019-10-31: qty 44

## 2019-10-31 MED ORDER — ONDANSETRON HCL 4 MG/2ML IJ SOLN: 4.0000 mg | Freq: Four times a day (QID) | INTRAMUSCULAR | Status: DC | PRN

## 2019-10-31 NOTE — Plan of Care (Signed)

## 2019-10-31 NOTE — Progress Notes (Signed)
Left lower extremity venous duplex completed. Refer to "CV Proc" under chart review to view preliminary results.  10/31/2019 5:38 PM Kelby Aline., MHA, RVT, RDCS, RDMS

## 2019-10-31 NOTE — Progress Notes (Signed)
SATURATION QUALIFICATIONS: (This note is used to comply with regulatory documentation for home oxygen)  Patient Saturations on Room Air at Rest = 92-93%  Patient Saturations on Room Air while Ambulating = 86%  Patient Saturations on 2 Liters of oxygen while Ambulating = 93-96%  Please briefly explain why patient needs home oxygen: Patient with episodes of desats with ambulation on room air; down in mid 80s with need of 2LPM [place] to increase saturations back to low to mid 90s.

## 2019-10-31 NOTE — Plan of Care (Signed)
Patient lying in bed this morning; no needs expressed. Pain chronic but better after pain medication this morning. Will continue to monitor.

## 2019-10-31 NOTE — Evaluation (Signed)
Physical Therapy Evaluation Patient Details Name: Shelby Anderson MRN: [id number] DOB: 1956-03-15 Today's Date: 10/31/2019   History of Present Illness  63 year old female was admitted for SOB.  She was recently here for hypoxia and CHF. PMH:  COPD, asthma, breast CA, on chemo, DM, chronic back pain, A fib and GERD  Clinical Impression  Pt admitted as above and presents with generalized weakness, limited endurance and mild balance deficits limiting functional mobility.  Pt should progress to dc home with family assist.  This date, pt desating to 86% on RA with HR elevated to 119 but with 2L O2 improved to mid 90's saturation and HR ~ 110.    Follow Up Recommendations No PT follow up    Equipment Recommendations  None recommended by PT    Recommendations for Other Services       Precautions / Restrictions Precautions Precautions: Fall Restrictions Weight Bearing Restrictions: No      Mobility  Bed Mobility               General bed mobility comments: OOB on arrival to room  Transfers Overall transfer level: Needs assistance Equipment used: Rolling walker (2 wheeled) Transfers: Sit to/from Stand Sit to Stand: Min guard         General transfer comment: for safety; steadying  Ambulation/Gait Ambulation/Gait assistance: Min guard Gait Distance (Feet): 440 Feet Assistive device: IV Pole;None Gait Pattern/deviations: Step-to pattern;Step-through pattern;Decreased step length - right;Decreased step length - left;Shuffle;Trunk flexed;Wide base of support Gait velocity: decr   General Gait Details: Pt ambulated 340' with IV pole and minimal instability.  Pt ambulated additional 100' with no AD for support and noted decreased speed and increased instability but still with no LOB  Stairs            Wheelchair Mobility    Modified Rankin (Stroke Patients Only)       Balance Overall balance assessment: Needs assistance Sitting-balance support: No  upper extremity supported;Feet supported Sitting balance-Leahy Scale: Normal     Standing balance support: No upper extremity supported Standing balance-Leahy Scale: Fair                               Pertinent Vitals/Pain Pain Assessment: No/denies pain    Home Living Family/patient expects to be discharged to:: Private residence Living Arrangements: Spouse/significant other Available Help at Discharge: Family Type of Home: House Home Access: [place]: Laundry or work area in [place]: Environmental consultant - 2 wheels;Cane - single point      Prior Function Level of Independence: Independent with assistive device(s)         Comments: Uses cane outside if uneven surface     Hand Dominance        Extremity/Trunk Assessment   Upper Extremity Assessment Upper Extremity Assessment: Overall WFL for tasks assessed    Lower Extremity Assessment Lower Extremity Assessment: Generalized weakness       Communication   Communication: No difficulties  Cognition Arousal/Alertness: Awake/alert Behavior During Therapy: WFL for tasks assessed/performed Overall Cognitive Status: Within Functional Limits for tasks assessed                                        General Comments      Exercises     Assessment/Plan  PT Assessment Patient needs continued PT services  PT Problem List Decreased strength;Decreased activity tolerance;Decreased balance;Decreased mobility;Decreased knowledge of use of DME       PT Treatment Interventions DME instruction;Gait training;Stair training;Functional mobility training;Therapeutic activities;Therapeutic exercise;Patient/family education    PT Goals (Current goals can be found in the Care Plan section)  Acute Rehab PT Goals Patient Stated Goal: home PT Goal Formulation: With patient Time For Goal Achievement: 11/14/19 Potential to Achieve Goals: Good    Frequency Min  3X/week   Barriers to discharge        Co-evaluation               AM-PAC PT "6 Clicks" Mobility  Outcome Measure Help needed turning from your back to your side while in a flat bed without using bedrails?: A Little Help needed moving from lying on your back to sitting on the side of a flat bed without using bedrails?: A Little Help needed moving to and from a bed to a chair (including a wheelchair)?: A Little Help needed standing up from a chair using your arms (e.g., wheelchair or bedside chair)?: A Little Help needed to walk in hospital room?: A Little Help needed climbing 3-5 steps with a railing? : A Little 6 Click Score: 18    End of Session Equipment Utilized During Treatment: Gait belt Activity Tolerance: Patient tolerated treatment well Patient left: Other (comment)(sitting EOB to wash up) Nurse Communication: Mobility status PT Visit Diagnosis: Difficulty in walking, not elsewhere classified (R26.2);Muscle weakness (generalized) (M62.81);Unsteadiness on feet (R26.81)    Time: 1253-1310 PT Time Calculation (min) (ACUTE ONLY): 17 min   Charges:   PT Evaluation $PT Eval Low Complexity: 1 Low          [place] Pager [phone] Office [phone]   Arion Morgan 10/31/2019, 3:19 PM

## 2019-10-31 NOTE — Plan of Care (Signed)
Initiated care plan 

## 2019-10-31 NOTE — Evaluation (Signed)
Occupational Therapy Evaluation Patient Details Name: Shelby Anderson MRN: [id number] DOB: [birth year] Today's Date: 10/31/2019    History of Present Illness 63 year old female was admitted for SOB.  She was recently here for hypoxia and CHF. PMH:  COPD, asthma, breast CA, on chemo, DM, chronic back pain, A fib and GERD   Clinical Impression   Pt was admitted for the above.  At baseline, she is mostly mod I/set up for adls; grandchildren assist with LB adls as needed.  Pt is currently min guard assist ambulating with IV pole for safety and near baseline for adls. Recommend 3:1 commode for home    Follow Up Recommendations  Supervision/Assistance - 24 hour    Equipment Recommendations  3 in 1 bedside commode(was supposed to get one last admission and didn't)    Recommendations for Other Services       Precautions / Restrictions Precautions Precautions: Fall Restrictions Weight Bearing Restrictions: No      Mobility Bed Mobility               General bed mobility comments: oob  Transfers Overall transfer level: Needs assistance Equipment used: Rolling walker (2 wheeled) Transfers: Sit to/from Stand Sit to Stand: Min guard         General transfer comment: for safety; steadying    Balance                                           ADL either performed or assessed with clinical judgement   ADL Overall ADL's : Needs assistance/impaired Eating/Feeding: Independent   Grooming: Set up;Sitting   Upper Body Bathing: Set up   Lower Body Bathing: Min guard   Upper Body Dressing : Set up   Lower Body Dressing: Minimal assistance   Toilet Transfer: Min guard;Ambulation   Toileting- Clothing Manipulation and Hygiene: Min guard         General ADL Comments: pt states that she brings legs up for socks; she sometimes has difficulty getting second leg into pants/underwear and granddaughters assist. Pt walked with IV pole; much steadier  with this vs without. She uses a SPC at home.  Pt doesn't have a Secondary school teacher; educated that she could also use kitchen tongs to extend reach for adls     Vision         Perception     Praxis      Pertinent Vitals/Pain Pain Assessment: No/denies pain     Hand Dominance     Extremity/Trunk Assessment Upper Extremity Assessment Upper Extremity Assessment: Overall WFL for tasks assessed           Communication Communication Communication: No difficulties   Cognition Arousal/Alertness: Awake/alert Behavior During Therapy: WFL for tasks assessed/performed Overall Cognitive Status: Within Functional Limits for tasks assessed                                     General Comments       Exercises     Shoulder Instructions      Home Living Family/patient expects to be discharged to:: Private residence Living Arrangements: Spouse/significant other Available Help at Discharge: Family               Bathroom Shower/Tub: Teacher, early years/pre: Standard     Home  Equipment: Gilford Rile - 2 wheels;Cane - single point          Prior Functioning/Environment Level of Independence: Independent with assistive device(s)        Comments: Uses cane outside if uneven surface        OT Problem List:        OT Treatment/Interventions:      OT Goals(Current goals can be found in the care plan section) Acute Rehab OT Goals Patient Stated Goal: home OT Goal Formulation: All assessment and education complete, DC therapy  OT Frequency:     Barriers to D/C:            Co-evaluation              AM-PAC OT "6 Clicks" Daily Activity     Outcome Measure Help from another person eating meals?: None Help from another person taking care of personal grooming?: A Little Help from another person toileting, which includes using toliet, bedpan, or urinal?: A Little Help from another person bathing (including washing, rinsing, drying)?: A  Little Help from another person to put on and taking off regular upper body clothing?: A Little Help from another person to put on and taking off regular lower body clothing?: A Little 6 Click Score: 19   End of Session    Activity Tolerance: Patient tolerated treatment well Patient left: in chair;with call bell/phone within reach;with chair alarm set  OT Visit Diagnosis: Muscle weakness (generalized) (M62.81)                Time: [id number] OT Time Calculation (min): 15 min Charges:  OT General Charges $OT Visit: 1 Visit OT Evaluation $OT Eval Low Complexity: 1 Low  Lesle Chris, OTR/L Acute Rehabilitation Services [phone] [facility] pager [phone] office 10/31/2019  [place] 10/31/2019, 2:16 PM

## 2019-10-31 NOTE — Progress Notes (Signed)
PROGRESS NOTE    Shelby Anderson  [id number] DOB: Oct 10, 1956 DOA: 10/30/2019 PCP: Caren Macadam, MD   Brief Narrative:63 y.o. female with medical history significant of asthma COPD and sleep apnea breast cancer currently undergoing chemotherapy, DM2, chronic back pain, paroxysmal atrial fibrillation, peptic ulcer disease, GERD    Presented with worsening shortness of breath for about a week has been having some wheezing as well occasionally feeling somewhat confused she was advised to go to emergency department She was taken some extra Lasix at home to see if that would help with her shortness of breath.  She reported leg swelling left worse than the right but she says she usually accumulates a fluid mainly in her left leg.  She checked her oxygen saturation at home and it was as low 70% room air while walking.  K no nausea no vomiting  currently seeing no fevers.    Recent episode of worsening wheezing associated low-grade fever she had a tentative visit to her pulmonologist on 23 November reporting increased shortness of breath she started to back using oxygen at 2 L which kept her oxygen saturation around 90s she is usually not using oxygen on a regular basis  Last admission was about a month ago for decompensated diastolic heart failure neutropenic fever she at that time was treated with IV Lasix 2D echo during her admission showed normal EF CAT scan showed no evidence of PE  Assessment & Plan:   Active Problems:   Coronary artery disease   OSA (obstructive sleep apnea)   Hypertension associated with diabetes (HCC)   GERD (gastroesophageal reflux disease)   Chronic back pain   Type 2 diabetes mellitus without complication, without long-term current use of insulin (HCC)   Morbid obesity due to excess calories ([place])   Hyperlipidemia associated with type 2 diabetes mellitus ([place])   Malignant neoplasm of upper-outer quadrant of right breast in female, estrogen  receptor negative (HCC)   Acute respiratory failure with hypoxia (HCC)   Chronic diastolic CHF (congestive heart failure) ([place])   Acute respiratory failure ([place])    #1 acute on chronic hypoxic respiratory failure-multifactorial likely due to asthma exacerbation. Patient has history of asthma diastolic heart failure.  She was recently admitted to hospital with asthma exacerbation and CHF exacerbation a month ago.  At that time she was discharged without oxygen.  She has had oxygen in the past.  She lives at home with her husband who smokes all the time.  Her labs are significant for a pH of 7.3 7/47/47/79.  LDH is 197 ferritin is 268 CRP is 4.1 pro calcitonin is less than 0.10 lactic acid level is 1.5 white count is 12.1 hemoglobin 11.5 platelet count 178.  Her D-dimer is 0.96 TSH is 2.3.  Respiratory virus panel is negative.  Initial point-of-care antigen negative.  Repeat Covid negative. ROOM AIR OXYGEN 86% with ambulation Continue iv steroids/azithromycin She is anxious to go home  If she is better tomorrow consider dc  #2HTN 125/75 continue home meds   #3hyperlipedemia continue statin   #4OSA on cpap at home  [id number] diastolic chf -continue home lasix  #6type 2 dm blood sugar elevated due to steroids continue ssi  #7breast cancer follwed by dr Sonny Dandy   Estimated body mass index is 35.86 kg/m as calculated from the following:   Height as of this encounter: 5\' 6"  (1.676 m).   Weight as of this encounter: 100.8 kg.  DVT prophylaxis: lovenox Code Status: full Family Communication:  none Disposition Plan: pending improvement    Consultants:   none  Procedures: none Antimicrobials: azithromycin Subjective: Continues to c/o sob wheezing no nausea vomiting  Objective: Vitals:   10/31/19 0030 10/31/19 0137 10/31/19 0242 10/31/19 0445  BP: 116/70 (!) 150/103  131/83  Pulse: 75 82  82  Resp: 17 18  16   Temp:  99.1 F (37.3 C)  98.9 F (37.2 C)  TempSrc:  Oral  Oral   SpO2: 100% 100%  96%  Weight:   100.8 kg   Height:   5\' 6"  (1.676 m)     Intake/Output Summary (Last 24 hours) at 10/31/2019 1041 Last data filed at 10/31/2019 0445 Gross per 24 hour  Intake 460.39 ml  Output -  Net 460.39 ml   Filed Weights   10/31/19 0242  Weight: 100.8 kg    Examination:  General exam: Appears calm and comfortable  Respiratory system: diffuse wheezing, to auscultation. Respiratory effort normal. Cardiovascular system: S1 & S2 heard, RRR. No JVD, murmurs, rubs, gallops or clicks. No pedal edema. Gastrointestinal system: Abdomen is nondistended, soft and nontender. No organomegaly or masses felt. Normal bowel sounds heard. Central nervous system: Alert and oriented. No focal neurological deficits. Extremities: trace edema Skin: No rashes, lesions or ulcers Psychiatry: Judgement and insight appear normal. Mood & affect appropriate.     Data Reviewed: I have personally reviewed following labs and imaging studies  CBC: Recent Labs  Lab 10/26/19 1610 10/30/19 1635 10/31/19 0402  WBC 20.5* 9.8 12.1*  NEUTROABS 16.5* 7.9* 10.3*  HGB 12.4 11.8* 11.5*  HCT 39.5 37.9 38.0  MCV 92.7 94.0 93.6  PLT 149* 171 0000000   Basic Metabolic Panel: Recent Labs  Lab 10/26/19 1610 10/30/19 1635 10/31/19 0402  NA 140 141  --   K 3.7 3.4*  --   CL 100 98  --   CO2 28 33*  --   GLUCOSE 91 128*  --   BUN 11 11  --   CREATININE 0.80 0.69  --   CALCIUM 8.8* 8.9  --   MG  --   --  1.6*  PHOS  --   --  4.4   GFR: Estimated Creatinine Clearance: 88.5 mL/min (by C-G formula based on SCr of 0.69 mg/dL). Liver Function Tests: Recent Labs  Lab 10/30/19 1635  AST 32  ALT 28  ALKPHOS 93  BILITOT 0.6  PROT 7.0  ALBUMIN 3.3*   No results for input(s): LIPASE, AMYLASE in the last 168 hours. No results for input(s): AMMONIA in the last 168 hours. Coagulation Profile: Recent Labs  Lab 10/30/19 1635  INR 1.0   Cardiac Enzymes: No results for input(s): CKTOTAL,  CKMB, CKMBINDEX, TROPONINI in the last 168 hours. BNP (last 3 results) No results for input(s): PROBNP in the last 8760 hours. HbA1C: Recent Labs    10/31/19 0402  HGBA1C 7.7*   CBG: Recent Labs  Lab 10/31/19 0136 10/31/19 0446 10/31/19 0749  GLUCAP 82 134* 162*   Lipid Profile: No results for input(s): CHOL, HDL, LDLCALC, TRIG, CHOLHDL, LDLDIRECT in the last 72 hours. Thyroid Function Tests: Recent Labs    10/31/19 0402  TSH 2.350   Anemia Panel: Recent Labs    10/31/19 0002  FERRITIN 268   Sepsis Labs: Recent Labs  Lab 10/30/19 1635 10/31/19 0002 10/31/19 0402  PROCALCITON  --  <0.10  --   LATICACIDVEN 1.6  --  1.5    Recent Results (from the past 240 hour(s))  Novel  Coronavirus, NAA ([facility])     Status: None   Collection Time: 10/27/19 12:00 AM   Specimen: Nasopharyngeal(NP) swabs in vial transport medium   NASOPHARYNGE  TESTING  Result Value Ref Range Status   SARS-CoV-2, NAA Not Detected Not Detected Final    Comment: This nucleic acid amplification test was developed and its performance characteristics determined by [employer]. Nucleic acid amplification tests include PCR and TMA. This test has not been FDA cleared or approved. This test has been authorized by FDA under an Emergency Use Authorization (EUA). This test is only authorized for the duration of time the declaration that circumstances exist justifying the authorization of the emergency use of in vitro diagnostic tests for detection of SARS-CoV-2 virus and/or diagnosis of COVID-19 infection under section 564(b)(1) of the Act, 21 U.S.C. [id number]) (1), unless the authorization is terminated or revoked sooner. When diagnostic testing is negative, the possibility of a false negative result should be considered in the context of a patient's recent exposures and the presence of clinical signs and symptoms consistent with COVID-19. An individual without symptoms of COVID-19 and who  is not shedding SARS-CoV-2 virus would  expect to have a negative (not detected) result in this assay.   SARS CORONAVIRUS 2 (TAT 6-24 HRS) Nasopharyngeal Nasopharyngeal Swab     Status: None   Collection Time: 10/30/19  9:03 PM   Specimen: Nasopharyngeal Swab  Result Value Ref Range Status   SARS Coronavirus 2 NEGATIVE NEGATIVE Final    Comment: (NOTE) SARS-CoV-2 target nucleic acids are NOT DETECTED. The SARS-CoV-2 RNA is generally detectable in upper and lower respiratory specimens during the acute phase of infection. Negative results do not preclude SARS-CoV-2 infection, do not rule out co-infections with other pathogens, and should not be used as the sole basis for treatment or other patient management decisions. Negative results must be combined with clinical observations, patient history, and epidemiological information. The expected result is Negative. Fact Sheet for Patients: SugarRoll.be Fact Sheet for Healthcare Providers: https://www.woods-mathews.com/ This test is not yet approved or cleared by the [place] FDA and  has been authorized for detection and/or diagnosis of SARS-CoV-2 by FDA under an Emergency Use Authorization (EUA). This EUA will remain  in effect (meaning this test can be used) for the duration of the COVID-19 declaration under Section 56 4(b)(1) of the Act, 21 U.S.C. section 360bbb-3(b)(1), unless the authorization is terminated or revoked sooner. Performed at [facility], [place] [address] [place], [place] [address]   Respiratory Panel by PCR     Status: None   Collection Time: 10/31/19 12:02 AM   Specimen: Nasopharyngeal Swab; Respiratory  Result Value Ref Range Status   Adenovirus NOT DETECTED NOT DETECTED Final   Coronavirus 229E NOT DETECTED NOT DETECTED Final    Comment: (NOTE) The Coronavirus on the Respiratory Panel, DOES NOT test for the novel  Coronavirus (2019 nCoV)    Coronavirus HKU1  NOT DETECTED NOT DETECTED Final   Coronavirus NL63 NOT DETECTED NOT DETECTED Final   Coronavirus OC43 NOT DETECTED NOT DETECTED Final   Metapneumovirus NOT DETECTED NOT DETECTED Final   Rhinovirus / Enterovirus NOT DETECTED NOT DETECTED Final   Influenza A NOT DETECTED NOT DETECTED Final   Influenza B NOT DETECTED NOT DETECTED Final   Parainfluenza Virus 1 NOT DETECTED NOT DETECTED Final   Parainfluenza Virus 2 NOT DETECTED NOT DETECTED Final   Parainfluenza Virus 3 NOT DETECTED NOT DETECTED Final   Parainfluenza Virus 4 NOT DETECTED NOT  DETECTED Final   Respiratory Syncytial Virus NOT DETECTED NOT DETECTED Final   Bordetella pertussis NOT DETECTED NOT DETECTED Final   Chlamydophila pneumoniae NOT DETECTED NOT DETECTED Final   Mycoplasma pneumoniae NOT DETECTED NOT DETECTED Final    Comment: Performed at [facility], [place] [address] [place], [place] [address]         Radiology Studies: Ct Angio Chest Pe W And/or Wo Contrast  Result Date: 10/30/2019 CLINICAL DATA:  Shortness of breath EXAM: CT ANGIOGRAPHY CHEST WITH CONTRAST TECHNIQUE: Multidetector CT imaging of the chest was performed using the standard protocol during bolus administration of intravenous contrast. Multiplanar CT image reconstructions and MIPs were obtained to evaluate the vascular anatomy. CONTRAST:  175mL OMNIPAQUE IOHEXOL 350 MG/ML SOLN COMPARISON:  Chest x-ray October 30, 2019. CTA of the chest October 01, 2019. FINDINGS: Cardiovascular: The thoracic aorta demonstrates no aneurysm or dissection. Coronary artery calcifications are identified involving the LAD. Heart is stable. Evaluation of pulmonary arteries beyond the segmental branches is limited due to respiratory motion. Within this limitation, no pulmonary emboli identified. Mediastinum/Nodes: No enlarged mediastinal, hilar, or axillary lymph nodes. Thyroid gland, trachea, and esophagus demonstrate no significant findings. Lungs/Pleura: Central airways. No  pneumothorax. Patchy pulmonary opacities are identified, primarily ground-glass in nature. No consolidation identified. No nodules masses. Upper Abdomen: No acute abnormality. Musculoskeletal: No chest wall abnormality. No acute or significant osseous findings. Review of the MIP images confirms the above findings. IMPRESSION: 1. Scattered ground-glass opacities throughout the lungs may represent mild edema, scattered subsegmental atelectasis, or atypical infection. No consolidation. 2. No central pulmonary emboli identified. Evaluation beyond the central pulmonary arteries is somewhat limited due to motion but no emboli are noted. 3. Coronary artery calcifications involving the LAD. 4. No other abnormalities. Aortic Atherosclerosis (ICD10-I70.0). Electronically Signed   By: Dorise Bullion III M.D   On: 10/30/2019 21:54   Dg Chest Port 1 View  Result Date: 10/30/2019 CLINICAL DATA:  Shortness of breath EXAM: PORTABLE CHEST 1 VIEW COMPARISON:  10/26/2019 FINDINGS: Cardiomegaly. Implantable loop recorder. Right chest port catheter. Both lungs are clear. The visualized skeletal structures are unremarkable. IMPRESSION: Cardiomegaly without acute abnormality of the lungs in AP portable projection. Electronically Signed   By: Eddie Candle M.D.   On: 10/30/2019 16:40        Scheduled Meds: . aspirin EC  81 mg Oral QHS  . atorvastatin  40 mg Oral q1800  . [START ON 11/01/2019] azithromycin  500 mg Oral Daily  . Chlorhexidine Gluconate Cloth  6 each Topical Daily  . doxepin  20 mg Oral QHS  . enoxaparin (LOVENOX) injection  40 mg Subcutaneous Daily  . furosemide  40 mg Oral Daily  . insulin aspart  0-9 Units Subcutaneous Q4H  . methylPREDNISolone (SOLU-MEDROL) injection  40 mg Intravenous Q12H   Followed by  . [START ON 11/01/2019] predniSONE  40 mg Oral Q breakfast  . mometasone-formoterol  1 puff Inhalation BID  . montelukast  10 mg Oral QHS  . pantoprazole  40 mg Oral Daily  . pregabalin  150 mg  Oral TID  . sodium chloride flush  3 mL Intravenous Q12H  . sodium chloride flush  3 mL Intravenous Q12H  . sucralfate  1 g Oral TID WC & HS  . vitamin B-12  500 mcg Oral Daily   Continuous Infusions: . sodium chloride    . azithromycin       LOS: 1 day     Georgette Shell,  MD [facility]   If 7PM-7AM, please contact night-coverage [email or web address] Password [facility] 10/31/2019, 10:41 AM

## 2019-10-31 NOTE — Progress Notes (Signed)
Patient requested pain medication for chronic pain; hydrocodone/acetaminophen 10/325 ordered. Patient states that is what she got earlier this morning and it did take the edge off of her pain but did not work great and did not last long. States she takes 20mg  oxycodone every 4 hours as needed at home. Physician on call informed of normal pain medication regimen and request to change dosing to something closer to home regimen. New orders received; will continue to monitor.

## 2019-11-01 DIAGNOSIS — E119 Type 2 diabetes mellitus without complications: Secondary | ICD-10-CM

## 2019-11-01 DIAGNOSIS — E1169 Type 2 diabetes mellitus with other specified complication: Secondary | ICD-10-CM

## 2019-11-01 DIAGNOSIS — I10 Essential (primary) hypertension: Secondary | ICD-10-CM

## 2019-11-01 DIAGNOSIS — I5031 Acute diastolic (congestive) heart failure: Secondary | ICD-10-CM

## 2019-11-01 DIAGNOSIS — E785 Hyperlipidemia, unspecified: Secondary | ICD-10-CM

## 2019-11-01 DIAGNOSIS — E1159 Type 2 diabetes mellitus with other circulatory complications: Secondary | ICD-10-CM

## 2019-11-01 DIAGNOSIS — K219 Gastro-esophageal reflux disease without esophagitis: Secondary | ICD-10-CM

## 2019-11-01 DIAGNOSIS — G4733 Obstructive sleep apnea (adult) (pediatric): Secondary | ICD-10-CM

## 2019-11-01 LAB — CBC
HCT: 37.7 % (ref 36.0–46.0)
Hemoglobin: 11.6 g/dL — ABNORMAL LOW (ref 12.0–15.0)
MCH: 28.9 pg (ref 26.0–34.0)
MCHC: 30.8 g/dL (ref 30.0–36.0)
MCV: 94 fL (ref 80.0–100.0)
Platelets: 284 10*3/uL (ref 150–400)
RBC: 4.01 MIL/uL (ref 3.87–5.11)
RDW: 15.3 % (ref 11.5–15.5)
WBC: 13.8 10*3/uL — ABNORMAL HIGH (ref 4.0–10.5)
nRBC: 0 % (ref 0.0–0.2)

## 2019-11-01 LAB — COMPREHENSIVE METABOLIC PANEL
ALT: 26 U/L (ref 0–44)
AST: 25 U/L (ref 15–41)
Albumin: 3.2 g/dL — ABNORMAL LOW (ref 3.5–5.0)
Alkaline Phosphatase: 76 U/L (ref 38–126)
Anion gap: 8 (ref 5–15)
BUN: 13 mg/dL (ref 8–23)
CO2: 32 mmol/L (ref 22–32)
Calcium: 8.5 mg/dL — ABNORMAL LOW (ref 8.9–10.3)
Chloride: 100 mmol/L (ref 98–111)
Creatinine, Ser: 0.65 mg/dL (ref 0.44–1.00)
GFR calc Af Amer: >60 mL/min (ref >60)
GFR calc non Af Amer: >60 mL/min (ref >60)
Glucose, Bld: 265 mg/dL — ABNORMAL HIGH (ref 70–99)
Potassium: 3.5 mmol/L (ref 3.5–5.1)
Sodium: 140 mmol/L (ref 135–145)
Total Bilirubin: 0.5 mg/dL (ref 0.3–1.2)
Total Protein: 7 g/dL (ref 6.5–8.1)

## 2019-11-01 LAB — GLUCOSE, CAPILLARY
Glucose-Capillary: 118 mg/dL — ABNORMAL HIGH (ref 70–99)
Glucose-Capillary: 120 mg/dL — ABNORMAL HIGH (ref 70–99)
Glucose-Capillary: 165 mg/dL — ABNORMAL HIGH (ref 70–99)
Glucose-Capillary: 169 mg/dL — ABNORMAL HIGH (ref 70–99)
Glucose-Capillary: 217 mg/dL — ABNORMAL HIGH (ref 70–99)
Glucose-Capillary: 244 mg/dL — ABNORMAL HIGH (ref 70–99)

## 2019-11-01 LAB — URINE CULTURE: Culture: <10000 — AB

## 2019-11-01 MED ORDER — POTASSIUM CHLORIDE CRYS ER 20 MEQ PO TBCR
40.0000 meq | EXTENDED_RELEASE_TABLET | Freq: Two times a day (BID) | ORAL | Status: DC
  Filled 2019-11-01: qty 2

## 2019-11-01 MED ORDER — CLOBETASOL PROPIONATE 0.05 % EX CREA
TOPICAL_CREAM | Freq: Two times a day (BID) | CUTANEOUS | Status: DC
  Administered 2019-11-01: 22:00:00 via TOPICAL
  Administered 2019-11-01 – 2019-11-02 (×2): 1 via TOPICAL
  Administered 2019-11-02 – 2019-11-03 (×2): via TOPICAL
  Filled 2019-11-01: qty 15

## 2019-11-01 MED ORDER — POTASSIUM CHLORIDE CRYS ER 10 MEQ PO TBCR
40.0000 meq | EXTENDED_RELEASE_TABLET | Freq: Two times a day (BID) | ORAL | Status: AC
  Administered 2019-11-01 (×2): 40 meq via ORAL
  Filled 2019-11-01 (×2): qty 4

## 2019-11-01 MED ORDER — FUROSEMIDE 10 MG/ML IJ SOLN
40.0000 mg | Freq: Two times a day (BID) | INTRAMUSCULAR | Status: DC
  Administered 2019-11-01 – 2019-11-02 (×4): 40 mg via INTRAVENOUS
  Filled 2019-11-01 (×4): qty 4

## 2019-11-01 NOTE — Progress Notes (Signed)
SATURATION QUALIFICATIONS: (This note is used to comply with regulatory documentation for home oxygen)  Patient Saturations on Room Air at Rest = 89%  Patient Saturations on Room Air while Ambulating =83%  Patient Saturations on 2 Liters of oxygen while Ambulating = 100%  Please briefly explain why patient needs home oxygen:

## 2019-11-01 NOTE — Progress Notes (Signed)
PROGRESS NOTE    Shelby Anderson   [id number]  DOB: 09/28/1956  DOA: 10/30/2019 PCP: Caren Macadam, MD   Brief Narrative:  Shelby Anderson 63 y.o.femalewith medical history significant of asthma COPD and sleep apnea breast cancer currently undergoing chemotherapy, DM2, chronic back pain, paroxysmal atrial fibrillation, peptic ulcer disease, GERD   Presented withworsening shortness of breath for about a week has been having some wheezing as well occasionally feeling somewhat confused she was advised to go to emergency department She was taken some extra Lasix at home to see if that would help with her shortness of breath.  She reported leg swelling left worse than the right but she says she usually accumulates a fluid mainly in her left leg.  She checked her oxygen saturation at home and it was as low 70% room air while walking. K no nausea no vomiting  currently seeing no fevers.   Recent episode of worsening wheezing associated low-grade fever she had a tentative visit to her pulmonologist on 23 November reporting increased shortness of breath she started to back using oxygen at 2 L which kept her oxygen saturation around 90s she is usually not using oxygen on a regular basis  Last admission was about a month ago for decompensated diastolic heart failure neutropenic fever she at that time was treated with IV Lasix 2D echo during her admission showed normal EF CAT scan showed no evidence of PE  She was admitted for acute respiratory failure thought to be secondary to acute bronchitis and a COPD exacerbation.  Subjective: She continues to feel short of breath today.  She has an ongoing cough as well.    Assessment & Plan:   Principal Problem:   Acute respiratory failure with hypoxia  - acute COPD exacerbation and acute on chronic diastolic heart failure -Covid negative -She continues to require 2 L of oxygen in the hospital-on ambulation her pulse  ox is still dropping to the 80s on room air -We will continue IV steroids, azithromycin and nebulizer treatments -We will change her oral Lasix to IV as I feel that she is a little fluid overloaded as well   Active Problems:    OSA (obstructive sleep apnea) -Continue CPAP at bedtime    GERD (gastroesophageal reflux disease) - Protonix + Sucralfate  Breast cancer - receiving chemotherapy- last treatment last week    Chronic back pain -cont narcotics    Type 2 diabetes mellitus without complication, without long-term current use of insulin  - cont SSI     Morbid obesity due to excess calories (HCC) Body mass index is 35.86 kg/m.      Time spent in minutes: 35 DVT prophylaxis: Lovenox Code Status: Full code Family Communication:  Disposition Plan: home when respiratory status improves Consultants:   none Procedures:   none Antimicrobials:  Anti-infectives (From admission, onward)   Start     Dose/Rate Route Frequency Ordered Stop   11/01/19 2200  azithromycin (ZITHROMAX) tablet 500 mg     500 mg Oral Daily 10/31/19 0131 11/05/19 0959   10/31/19 2200  azithromycin (ZITHROMAX) 500 mg in sodium chloride 0.9 % 250 mL IVPB     500 mg 250 mL/hr over 60 Minutes Intravenous Every 24 hours 10/31/19 0131 10/31/19 2329   10/30/19 2230  cefTRIAXone (ROCEPHIN) 1 g in sodium chloride 0.9 % 100 mL IVPB     1 g 200 mL/hr over 30 Minutes Intravenous  Once 10/30/19 2222 10/30/19 2354   10/30/19 2230  azithromycin (ZITHROMAX) 500 mg in sodium chloride 0.9 % 250 mL IVPB     500 mg 250 mL/hr over 60 Minutes Intravenous  Once 10/30/19 2222 10/31/19 0104       Objective: Vitals:   11/01/19 1000 11/01/19 1200 11/01/19 1358 11/01/19 1600  BP: 105/74 115/70 110/67 104/61  Pulse: 68 74 68 71  Resp: 18 18 14 16   Temp: 98 F (36.7 C) 99 F (37.2 C) 99.3 F (37.4 C) 98.1 F (36.7 C)  TempSrc: Oral Oral Oral Oral  SpO2: 99% 97% 97% 97%  Weight:      Height:         Intake/Output Summary (Last 24 hours) at 11/01/2019 1606 Last data filed at 11/01/2019 1400 Gross per 24 hour  Intake 630 ml  Output 500 ml  Net 130 ml   Filed Weights   10/31/19 0242  Weight: 100.8 kg    Examination: General exam: Appears comfortable  HEENT: PERRLA, oral mucosa moist, no sclera icterus or thrush Respiratory system: crackles at bases-  Respiratory effort normal. Cardiovascular system: S1 & S2 heard, RRR.   Gastrointestinal system: Abdomen soft, non-tender, nondistended. Normal bowel sounds. Central nervous system: Alert and oriented. No focal neurological deficits. Extremities: No cyanosis, clubbing or edema Skin: No rashes or ulcers Psychiatry:  Mood & affect appropriate.     Data Reviewed: I have personally reviewed following labs and imaging studies  CBC: Recent Labs  Lab 10/26/19 1610 10/30/19 1635 10/31/19 0402 11/01/19 1215  WBC 20.5* 9.8 12.1* 13.8*  NEUTROABS 16.5* 7.9* 10.3*  --   HGB 12.4 11.8* 11.5* 11.6*  HCT 39.5 37.9 38.0 37.7  MCV 92.7 94.0 93.6 94.0  PLT 149* 171 178 [id number]   Basic Metabolic Panel: Recent Labs  Lab 10/26/19 1610 10/30/19 1635 10/31/19 0402 11/01/19 1215  NA 140 141  --  140  K 3.7 3.4*  --  3.5  CL 100 98  --  100  CO2 28 33*  --  32  GLUCOSE 91 128*  --  265*  BUN 11 11  --  13  CREATININE 0.80 0.69  --  0.65  CALCIUM 8.8* 8.9  --  8.5*  MG  --   --  1.6*  --   PHOS  --   --  4.4  --    GFR: Estimated Creatinine Clearance: 88.5 mL/min (by C-G formula based on SCr of 0.65 mg/dL). Liver Function Tests: Recent Labs  Lab 10/30/19 1635 11/01/19 1215  AST 32 25  ALT 28 26  ALKPHOS 93 76  BILITOT 0.6 0.5  PROT 7.0 7.0  ALBUMIN 3.3* 3.2*   No results for input(s): LIPASE, AMYLASE in the last 168 hours. No results for input(s): AMMONIA in the last 168 hours. Coagulation Profile: Recent Labs  Lab 10/30/19 1635  INR 1.0   Cardiac Enzymes: No results for input(s): CKTOTAL, CKMB, CKMBINDEX,  TROPONINI in the last 168 hours. BNP (last 3 results) No results for input(s): PROBNP in the last 8760 hours. HbA1C: Recent Labs    10/31/19 0402  HGBA1C 7.7*   CBG: Recent Labs  Lab 10/31/19 2142 11/01/19 0039 11/01/19 0430 11/01/19 0737 11/01/19 1206  GLUCAP 204* 169* 120* 118* 244*   Lipid Profile: No results for input(s): CHOL, HDL, LDLCALC, TRIG, CHOLHDL, LDLDIRECT in the last 72 hours. Thyroid Function Tests: Recent Labs    10/31/19 0402  TSH 2.350   Anemia Panel: Recent Labs    10/31/19 0002  FERRITIN 268  Urine analysis:    Component Value Date/Time   COLORURINE STRAW (A) 10/30/2019 1635   APPEARANCEUR CLEAR 10/30/2019 1635   LABSPEC 1.006 10/30/2019 1635   PHURINE 5.0 10/30/2019 1635   GLUCOSEU NEGATIVE 10/30/2019 1635   GLUCOSEU NEGATIVE 01/05/2013 1535   HGBUR NEGATIVE 10/30/2019 1635   BILIRUBINUR NEGATIVE 10/30/2019 1635   BILIRUBINUR n 10/27/2014 1118   KETONESUR NEGATIVE 10/30/2019 1635   PROTEINUR NEGATIVE 10/30/2019 1635   UROBILINOGEN 0.2 10/27/2014 1118   UROBILINOGEN 1.0 12/23/2013 1344   NITRITE NEGATIVE 10/30/2019 1635   LEUKOCYTESUR SMALL (A) 10/30/2019 1635   Sepsis Labs: @LABRCNTIP (procalcitonin:4,lacticidven:4) ) Recent Results (from the past 240 hour(s))  Novel Coronavirus, NAA ([facility])     Status: None   Collection Time: 10/27/19 12:00 AM   Specimen: Nasopharyngeal(NP) swabs in vial transport medium   NASOPHARYNGE  TESTING  Result Value Ref Range Status   SARS-CoV-2, NAA Not Detected Not Detected Final    Comment: This nucleic acid amplification test was developed and its performance characteristics determined by [employer]. Nucleic acid amplification tests include PCR and TMA. This test has not been FDA cleared or approved. This test has been authorized by FDA under an Emergency Use Authorization (EUA). This test is only authorized for the duration of time the declaration that circumstances exist justifying  the authorization of the emergency use of in vitro diagnostic tests for detection of SARS-CoV-2 virus and/or diagnosis of COVID-19 infection under section 564(b)(1) of the Act, 21 U.S.C. [id number]) (1), unless the authorization is terminated or revoked sooner. When diagnostic testing is negative, the possibility of a false negative result should be considered in the context of a patient's recent exposures and the presence of clinical signs and symptoms consistent with COVID-19. An individual without symptoms of COVID-19 and who is not shedding SARS-CoV-2 virus would  expect to have a negative (not detected) result in this assay.   Blood Culture (routine x 2)     Status: None (Preliminary result)   Collection Time: 10/30/19  4:35 PM   Specimen: BLOOD LEFT ARM  Result Value Ref Range Status   Specimen Description   Final    BLOOD LEFT ARM Performed at [facility] [address], [place], [place] [address]    Special Requests   Final    BOTTLES DRAWN AEROBIC AND ANAEROBIC Blood Culture results may not be optimal due to an inadequate volume of blood received in culture bottles Performed at [facility] [address], [place], [place] [address]    Culture   Final    NO GROWTH 2 DAYS Performed at [facility] [address], [place], [place] [address]    Report Status PENDING  Incomplete  Blood Culture (routine x 2)     Status: None (Preliminary result)   Collection Time: 10/30/19  4:35 PM   Specimen: BLOOD LEFT HAND  Result Value Ref Range Status   Specimen Description   Final    BLOOD LEFT HAND Performed at [facility] [address] [place], [place] [address]    Special Requests   Final    BOTTLES DRAWN AEROBIC AND ANAEROBIC Blood Culture results may not be optimal due to an inadequate volume of blood received in culture bottles Performed at [facility] [address] [place],  [place] [address]    Culture   Final    NO GROWTH 2 DAYS Performed at [facility] [address]  [place], [place] [address]    Report Status PENDING  Incomplete  Urine culture     Status: Abnormal   Collection Time: 10/30/19  4:35 PM   Specimen: In/Out Cath Urine  Result Value Ref Range Status   Specimen Description   Final    IN/OUT CATH URINE Performed at [facility] [address], [place], [place] [address]    Special Requests   Final    NONE Performed at [facility], [place] [address], [place], [place] [address]    Culture (A)  Final    <10,000 COLONIES/mL INSIGNIFICANT GROWTH Performed at [facility] [address], [place], [place] [address]    Report Status 11/01/2019 FINAL  Final  SARS CORONAVIRUS 2 (TAT 6-24 HRS) Nasopharyngeal Nasopharyngeal Swab     Status: None   Collection Time: 10/30/19  9:03 PM   Specimen: Nasopharyngeal Swab  Result Value Ref Range Status   SARS Coronavirus 2 NEGATIVE NEGATIVE Final    Comment: (NOTE) SARS-CoV-2 target nucleic acids are NOT DETECTED. The SARS-CoV-2 RNA is generally detectable in upper and lower respiratory specimens during the acute phase of infection. Negative results do not preclude SARS-CoV-2 infection, do not rule out co-infections with other pathogens, and should not be used as the sole basis for treatment or other patient management decisions. Negative results must be combined with clinical observations, patient history, and epidemiological information. The expected result is Negative. Fact Sheet for Patients: SugarRoll.be Fact Sheet for Healthcare Providers: https://www.woods-mathews.com/ This test is not yet approved or cleared by the [place] FDA and  has been authorized for detection and/or diagnosis of SARS-CoV-2 by FDA under an Emergency Use Authorization (EUA). This EUA will remain  in effect (meaning this test can be  used) for the duration of the COVID-19 declaration under Section 56 4(b)(1) of the Act, 21 U.S.C. section 360bbb-3(b)(1), unless the authorization is terminated or revoked sooner. Performed at [facility], [place] [address] [place], [place] [address]   Respiratory Panel by PCR     Status: None   Collection Time: 10/31/19 12:02 AM   Specimen: Nasopharyngeal Swab; Respiratory  Result Value Ref Range Status   Adenovirus NOT DETECTED NOT DETECTED Final   Coronavirus 229E NOT DETECTED NOT DETECTED Final    Comment: (NOTE) The Coronavirus on the Respiratory Panel, DOES NOT test for the novel  Coronavirus (2019 nCoV)    Coronavirus HKU1 NOT DETECTED NOT DETECTED Final   Coronavirus NL63 NOT DETECTED NOT DETECTED Final   Coronavirus OC43 NOT DETECTED NOT DETECTED Final   Metapneumovirus NOT DETECTED NOT DETECTED Final   Rhinovirus / Enterovirus NOT DETECTED NOT DETECTED Final   Influenza A NOT DETECTED NOT DETECTED Final   Influenza B NOT DETECTED NOT DETECTED Final   Parainfluenza Virus 1 NOT DETECTED NOT DETECTED Final   Parainfluenza Virus 2 NOT DETECTED NOT DETECTED Final   Parainfluenza Virus 3 NOT DETECTED NOT DETECTED Final   Parainfluenza Virus 4 NOT DETECTED NOT DETECTED Final   Respiratory Syncytial Virus NOT DETECTED NOT DETECTED Final   Bordetella pertussis NOT DETECTED NOT DETECTED Final   Chlamydophila pneumoniae NOT DETECTED NOT DETECTED Final   Mycoplasma pneumoniae NOT DETECTED NOT DETECTED Final    Comment: Performed at [facility], [place]. [address] [place], [place] [address]         Radiology Studies: Ct Angio Chest Pe W And/or Wo Contrast  Result Date: 10/30/2019 CLINICAL DATA:  Shortness of breath  EXAM: CT ANGIOGRAPHY CHEST WITH CONTRAST TECHNIQUE: Multidetector CT imaging of the chest was performed using the standard protocol during bolus administration of intravenous contrast. Multiplanar CT image reconstructions and MIPs were obtained to evaluate  the vascular anatomy. CONTRAST:  139mL OMNIPAQUE IOHEXOL 350 MG/ML SOLN COMPARISON:  Chest x-ray October 30, 2019. CTA of the chest October 01, 2019. FINDINGS: Cardiovascular: The thoracic aorta demonstrates no aneurysm or dissection. Coronary artery calcifications are identified involving the LAD. Heart is stable. Evaluation of pulmonary arteries beyond the segmental branches is limited due to respiratory motion. Within this limitation, no pulmonary emboli identified. Mediastinum/Nodes: No enlarged mediastinal, hilar, or axillary lymph nodes. Thyroid gland, trachea, and esophagus demonstrate no significant findings. Lungs/Pleura: Central airways. No pneumothorax. Patchy pulmonary opacities are identified, primarily ground-glass in nature. No consolidation identified. No nodules masses. Upper Abdomen: No acute abnormality. Musculoskeletal: No chest wall abnormality. No acute or significant osseous findings. Review of the MIP images confirms the above findings. IMPRESSION: 1. Scattered ground-glass opacities throughout the lungs may represent mild edema, scattered subsegmental atelectasis, or atypical infection. No consolidation. 2. No central pulmonary emboli identified. Evaluation beyond the central pulmonary arteries is somewhat limited due to motion but no emboli are noted. 3. Coronary artery calcifications involving the LAD. 4. No other abnormalities. Aortic Atherosclerosis (ICD10-I70.0). Electronically Signed   By: Dorise Bullion III M.D   On: 10/30/2019 21:54   Dg Chest Port 1 View  Result Date: 10/30/2019 CLINICAL DATA:  Shortness of breath EXAM: PORTABLE CHEST 1 VIEW COMPARISON:  10/26/2019 FINDINGS: Cardiomegaly. Implantable loop recorder. Right chest port catheter. Both lungs are clear. The visualized skeletal structures are unremarkable. IMPRESSION: Cardiomegaly without acute abnormality of the lungs in AP portable projection. Electronically Signed   By: Eddie Candle M.D.   On: 10/30/2019 16:40    Vas [place] Lower Extremity Venous (dvt)  Result Date: 11/01/2019  Lower Venous Study Indications: Swelling.  Limitations: Poor ultrasound/tissue interface and body habitus. Comparison Study: No prior study. Performing Technologist: Maudry Mayhew MHA, RDMS, RVT, RDCS  Examination Guidelines: A complete evaluation includes B-mode imaging, spectral Doppler, color Doppler, and power Doppler as needed of all accessible portions of each vessel. Bilateral testing is considered an integral part of a complete examination. Limited examinations for reoccurring indications may be performed as noted.  +-----+---------------+---------+-----------+----------+--------------+ RIGHTCompressibilityPhasicitySpontaneityPropertiesThrombus Aging +-----+---------------+---------+-----------+----------+--------------+ CFV  Full           Yes      Yes                                 +-----+---------------+---------+-----------+----------+--------------+   +---------+---------------+---------+-----------+----------+--------------+ LEFT     CompressibilityPhasicitySpontaneityPropertiesThrombus Aging +---------+---------------+---------+-----------+----------+--------------+ CFV      Full           Yes      Yes                                 +---------+---------------+---------+-----------+----------+--------------+ SFJ      Full                                                        +---------+---------------+---------+-----------+----------+--------------+ FV Prox  Full                                                        +---------+---------------+---------+-----------+----------+--------------+  FV Mid   Full                                                        +---------+---------------+---------+-----------+----------+--------------+ FV DistalFull                                                         +---------+---------------+---------+-----------+----------+--------------+ PFV      Full                                                        +---------+---------------+---------+-----------+----------+--------------+ POP      Full           Yes      Yes                                 +---------+---------------+---------+-----------+----------+--------------+ PTV      Full                                                        +---------+---------------+---------+-----------+----------+--------------+ PERO     Full                                                        +---------+---------------+---------+-----------+----------+--------------+     Summary: Right: No evidence of common femoral vein obstruction. Left: There is no evidence of deep vein thrombosis in the lower extremity. No cystic structure found in the popliteal fossa.  *See table(s) above for measurements and observations. Electronically signed by Curt Jews MD on 11/01/2019 at 7:47:47 AM.    Final       Scheduled Meds: . aspirin EC  81 mg Oral QHS  . atorvastatin  40 mg Oral q1800  . azithromycin  500 mg Oral Daily  . Chlorhexidine Gluconate Cloth  6 each Topical Daily  . clobetasol cream   Topical BID  . doxepin  20 mg Oral QHS  . enoxaparin (LOVENOX) injection  40 mg Subcutaneous Daily  . furosemide  40 mg Intravenous BID  . insulin aspart  0-9 Units Subcutaneous Q4H  . mometasone-formoterol  1 puff Inhalation BID  . montelukast  10 mg Oral QHS  . pantoprazole  40 mg Oral Daily  . potassium chloride  40 mEq Oral BID  . predniSONE  40 mg Oral Q breakfast  . pregabalin  150 mg Oral TID  . sodium chloride flush  3 mL Intravenous Q12H  . sodium chloride flush  3 mL Intravenous Q12H  . sucralfate  1 g Oral TID WC & HS  . vitamin B-12  500 mcg Oral Daily   Continuous  Infusions: . sodium chloride       LOS: 2 days      Debbe Odea, MD [facility] Pager: [email or web address] Password  [email or web address] 11/01/2019, 4:06 PM

## 2019-11-01 NOTE — Progress Notes (Signed)
Physical Therapy Treatment Patient Details Name: Shelby Anderson MRN: [id number] DOB: 1956/11/27 Today's Date: 11/01/2019    History of Present Illness 63 year old female was admitted for SOB.  She was recently here for hypoxia and CHF. PMH:  COPD, asthma, breast CA, on chemo, DM, chronic back pain, A fib and GERD    PT Comments    Pt very motivated and with noted improvement in ambulatory stability and activity tolerance.  Pt on 2: O2 throughout session and maintained sats 90%+.  Follow Up Recommendations  No PT follow up     Equipment Recommendations  None recommended by PT    Recommendations for Other Services       Precautions / Restrictions Precautions Precautions: Fall Restrictions Weight Bearing Restrictions: No    Mobility  Bed Mobility Overal bed mobility: Modified Independent                Transfers Overall transfer level: Needs assistance Equipment used: None Transfers: Sit to/from Stand Sit to Stand: Min guard;Supervision         General transfer comment: for safety; steadying  Ambulation/Gait Ambulation/Gait assistance: Min guard Gait Distance (Feet): 450 Feet(twice) Assistive device: Straight cane Gait Pattern/deviations: Step-through pattern;Decreased step length - right;Decreased step length - left;Shuffle;Wide base of support Gait velocity: decr   General Gait Details: ambulated 1x450 with seated rest break between; mild instability with no LOB; Pt on 2L O2 and maintained sats 90%+   Stairs             Wheelchair Mobility    Modified Rankin (Stroke Patients Only)       Balance Overall balance assessment: Needs assistance Sitting-balance support: No upper extremity supported;Feet supported Sitting balance-Leahy Scale: Normal     Standing balance support: No upper extremity supported Standing balance-Leahy Scale: Fair                              Cognition Arousal/Alertness: Awake/alert Behavior  During Therapy: WFL for tasks assessed/performed Overall Cognitive Status: Within Functional Limits for tasks assessed                                        Exercises      General Comments        Pertinent Vitals/Pain Pain Assessment: No/denies pain    Home Living                      Prior Function            PT Goals (current goals can now be found in the care plan section) Acute Rehab PT Goals Patient Stated Goal: home PT Goal Formulation: With patient Time For Goal Achievement: 11/14/19 Potential to Achieve Goals: Good Progress towards PT goals: Progressing toward goals    Frequency    Min 3X/week      PT Plan Current plan remains appropriate    Co-evaluation              AM-PAC PT "6 Clicks" Mobility   Outcome Measure  Help needed turning from your back to your side while in a flat bed without using bedrails?: None Help needed moving from lying on your back to sitting on the side of a flat bed without using bedrails?: None Help needed moving to and from a bed to a chair (  including a wheelchair)?: A Little Help needed standing up from a chair using your arms (e.g., wheelchair or bedside chair)?: A Little Help needed to walk in hospital room?: A Little Help needed climbing 3-5 steps with a railing? : A Little 6 Click Score: 20    End of Session Equipment Utilized During Treatment: Gait belt Activity Tolerance: Patient tolerated treatment well Patient left: Other (comment) Nurse Communication: Mobility status PT Visit Diagnosis: Difficulty in walking, not elsewhere classified (R26.2);Muscle weakness (generalized) (M62.81);Unsteadiness on feet (R26.81)     Time: [id number] PT Time Calculation (min) (ACUTE ONLY): 20 min  Charges:  $Gait Training: 8-22 mins                     [place] Pager [phone] Office [phone]    Levelle Edelen 11/01/2019, 1:09 PM

## 2019-11-01 NOTE — Progress Notes (Signed)
Nutrition Brief Note  Patient identified on the Malnutrition Screening Tool (MST) Report  Pt with insignificant weight loss. Currently consuming 100% of meals at this time.   Wt Readings from Last 15 Encounters:  10/31/19 100.8 kg  10/16/19 103 kg  10/15/19 101.6 kg  10/04/19 100.5 kg  09/24/19 104.8 kg  09/23/19 104.3 kg  09/17/19 104.9 kg  09/14/19 106.1 kg  09/09/19 104.2 kg  08/19/19 106.7 kg  07/16/19 106.2 kg  04/22/19 104.3 kg  04/02/19 100.7 kg  12/15/18 106.1 kg  12/02/18 107.3 kg    Body mass index is 35.86 kg/m. Patient meets criteria for obesity based on current BMI.   Current diet order is CHO modified, patient is consuming approximately 100% of meals at this time. Labs and medications reviewed.   No nutrition interventions warranted at this time. If nutrition issues arise, please consult RD.   Clayton Bibles, MS, RD, LDN Inpatient Clinical Dietitian Pager: [phone] After Hours Pager: [phone]

## 2019-11-02 ENCOUNTER — Inpatient Hospital Stay (HOSPITAL_COMMUNITY): Payer: Medicare Other

## 2019-11-02 DIAGNOSIS — R0600 Dyspnea, unspecified: Secondary | ICD-10-CM

## 2019-11-02 LAB — GLUCOSE, CAPILLARY
Glucose-Capillary: 115 mg/dL — ABNORMAL HIGH (ref 70–99)
Glucose-Capillary: 149 mg/dL — ABNORMAL HIGH (ref 70–99)
Glucose-Capillary: 163 mg/dL — ABNORMAL HIGH (ref 70–99)
Glucose-Capillary: 164 mg/dL — ABNORMAL HIGH (ref 70–99)
Glucose-Capillary: 198 mg/dL — ABNORMAL HIGH (ref 70–99)
Glucose-Capillary: 202 mg/dL — ABNORMAL HIGH (ref 70–99)
Glucose-Capillary: 95 mg/dL (ref 70–99)

## 2019-11-02 LAB — BASIC METABOLIC PANEL
Anion gap: 10 (ref 5–15)
BUN: 13 mg/dL (ref 8–23)
CO2: 34 mmol/L — ABNORMAL HIGH (ref 22–32)
Calcium: 9.2 mg/dL (ref 8.9–10.3)
Chloride: 100 mmol/L (ref 98–111)
Creatinine, Ser: 0.59 mg/dL (ref 0.44–1.00)
GFR calc Af Amer: >60 mL/min (ref >60)
GFR calc non Af Amer: >60 mL/min (ref >60)
Glucose, Bld: 133 mg/dL — ABNORMAL HIGH (ref 70–99)
Potassium: 3.6 mmol/L (ref 3.5–5.1)
Sodium: 144 mmol/L (ref 135–145)

## 2019-11-02 MED ORDER — POTASSIUM CHLORIDE CRYS ER 20 MEQ PO TBCR
40.0000 meq | EXTENDED_RELEASE_TABLET | Freq: Every day | ORAL | Status: DC
  Filled 2019-11-02: qty 2

## 2019-11-02 MED ORDER — PANTOPRAZOLE SODIUM 40 MG PO TBEC
40.0000 mg | DELAYED_RELEASE_TABLET | Freq: Two times a day (BID) | ORAL | Status: DC
  Administered 2019-11-02 – 2019-11-03 (×3): 40 mg via ORAL
  Filled 2019-11-02 (×3): qty 1

## 2019-11-02 MED ORDER — AZITHROMYCIN 250 MG PO TABS
250.0000 mg | ORAL_TABLET | Freq: Every day | ORAL | Status: DC
  Administered 2019-11-03: 250 mg via ORAL
  Filled 2019-11-02: qty 1

## 2019-11-02 MED ORDER — POTASSIUM CHLORIDE CRYS ER 10 MEQ PO TBCR
40.0000 meq | EXTENDED_RELEASE_TABLET | Freq: Every day | ORAL | Status: DC
  Administered 2019-11-02 – 2019-11-03 (×2): 40 meq via ORAL
  Filled 2019-11-02 (×2): qty 4

## 2019-11-02 NOTE — Care Management Important Message (Signed)
Important Message  Patient Details IM Letter given to Cookie McGibboney RN to present to the Patient Name: Shelby Anderson MRN: [id number] Date of Birth: Jun 13, 1956   Medicare Important Message Given:  Yes     Kerin Salen 11/02/2019, 11:28 AM

## 2019-11-02 NOTE — Plan of Care (Signed)
  Problem: Clinical Measurements: Goal: Respiratory complications will improve Outcome: Progressing   Problem: Activity: Goal: Risk for activity intolerance will decrease Outcome: Progressing   Problem: Pain Managment: Goal: General experience of comfort will improve Outcome: Progressing   

## 2019-11-02 NOTE — Progress Notes (Signed)
Patient just completed ambulating in halls. Ambulated 1100 ft in hallways. Oxygen saturation monitored during ambulation. O2 saturation stayed between 85-89% on room air. Would return above 90% when stopped. Denied Sob until the very end of ambulation once returned to room but reports no DOB, just exertional SOB. Will cont to monitor.

## 2019-11-02 NOTE — Progress Notes (Addendum)
PROGRESS NOTE    Shelby Anderson   [id number]  DOB: [birth year]  DOA: 10/30/2019 PCP: Caren Macadam, MD   Brief Narrative:  Shelby Anderson 63 y.o.femalewith medical history significant of asthma COPD and sleep apnea breast cancer currently undergoing chemotherapy, DM2, chronic back pain, paroxysmal atrial fibrillation, peptic ulcer disease, GERD   Presented withworsening shortness of breath for about a week has been having some wheezing as well occasionally feeling somewhat confused she was advised to go to emergency department She was taken some extra Lasix at home to see if that would help with her shortness of breath.  She reported leg swelling left worse than the right but she says she usually accumulates a fluid mainly in her left leg.  She checked her oxygen saturation at home and it was as low 70% room air while walking. K no nausea no vomiting  currently seeing no fevers.   Recent episode of worsening wheezing associated low-grade fever she had a tentative visit to her pulmonologist on 23 November reporting increased shortness of breath she started to back using oxygen at 2 L which kept her oxygen saturation around 90s she is usually not using oxygen on a regular basis  Last admission was about a month ago for decompensated diastolic heart failure neutropenic fever she at that time was treated with IV Lasix 2D echo during her admission showed normal EF CAT scan showed no evidence of PE  She was admitted for acute respiratory failure thought to be secondary to acute bronchitis and a COPD exacerbation.  Subjective: She ambulated today but pulse ox remains in the 80s. She becomes short of breath, feels her heart racing and then becomes lightheaded. Her cough is still present and not much better.     Assessment & Plan:   Principal Problem:   Acute respiratory failure with hypoxia  - acute COPD exacerbation and acute on chronic diastolic heart  failure -Covid negative -She continues to require 2 L of oxygen in the hospital-on ambulation her pulse ox is still dropping to the 80s on room air -We will continue steroids, azithromycin, singular and nebulizer treatments -continue Lasix IV as I feel that she is fluid overloaded   - will repeat imaging today with CXR   Active Problems:    OSA (obstructive sleep apnea) -Continue CPAP at bedtime    GERD (gastroesophageal reflux disease) - Protonix + Sucralfate  Breast cancer - receiving chemotherapy- last treatment last week    Chronic back pain -cont narcotics    Type 2 diabetes mellitus without complication, without long-term current use of insulin  - cont SSI- A1 c 7.7     Morbid obesity due to excess calories (HCC) Body mass index is 35.86 kg/m.      Time spent in minutes: 35 DVT prophylaxis: Lovenox Code Status: Full code Family Communication:  Disposition Plan: home when respiratory status improves Consultants:   none Procedures:   none Antimicrobials:  Anti-infectives (From admission, onward)   Start     Dose/Rate Route Frequency Ordered Stop   11/01/19 2200  azithromycin (ZITHROMAX) tablet 500 mg     500 mg Oral Daily 10/31/19 0131 11/05/19 0959   10/31/19 2200  azithromycin (ZITHROMAX) 500 mg in sodium chloride 0.9 % 250 mL IVPB     500 mg 250 mL/hr over 60 Minutes Intravenous Every 24 hours 10/31/19 0131 10/31/19 2329   10/30/19 2230  cefTRIAXone (ROCEPHIN) 1 g in sodium chloride 0.9 % 100 mL IVPB  1 g 200 mL/hr over 30 Minutes Intravenous  Once 10/30/19 2222 10/30/19 2354   10/30/19 2230  azithromycin (ZITHROMAX) 500 mg in sodium chloride 0.9 % 250 mL IVPB     500 mg 250 mL/hr over 60 Minutes Intravenous  Once 10/30/19 2222 10/31/19 0104       Objective: Vitals:   11/02/19 0010 11/02/19 0436 11/02/19 0750 11/02/19 1233  BP: 113/71 127/86 137/73 128/76  Pulse: 65 (!) 57 (!) 58 65  Resp: 18 18 19 19   Temp: 98.1 F (36.7 C) 99 F (37.2  C) 97.8 F (36.6 C) 98.7 F (37.1 C)  TempSrc: Axillary Oral Oral Oral  SpO2: 100% 100% 93% 97%  Weight:      Height:        Intake/Output Summary (Last 24 hours) at 11/02/2019 1337 Last data filed at 11/01/2019 1959 Gross per 24 hour  Intake 360 ml  Output 4200 ml  Net -3840 ml   Filed Weights   10/31/19 0242  Weight: 100.8 kg    Examination: General exam: Appears comfortable  HEENT: PERRLA, oral mucosa moist, no sclera icterus or thrush Respiratory system: faint crackles at bases- ongoing cough- Respiratory effort normal. Cardiovascular system: S1 & S2 heard,  No murmurs  Gastrointestinal system: Abdomen soft, non-tender, nondistended. Normal bowel sounds   Central nervous system: Alert and oriented. No focal neurological deficits. Extremities: No cyanosis, clubbing or edema Skin: No rashes or ulcers Psychiatry:  Mood & affect appropriate.    Data Reviewed: I have personally reviewed following labs and imaging studies  CBC: Recent Labs  Lab 10/26/19 1610 10/30/19 1635 10/31/19 0402 11/01/19 1215  WBC 20.5* 9.8 12.1* 13.8*  NEUTROABS 16.5* 7.9* 10.3*  --   HGB 12.4 11.8* 11.5* 11.6*  HCT 39.5 37.9 38.0 37.7  MCV 92.7 94.0 93.6 94.0  PLT 149* 171 178 [id number]   Basic Metabolic Panel: Recent Labs  Lab 10/26/19 1610 10/30/19 1635 10/31/19 0402 11/01/19 1215 11/02/19 0420  NA 140 141  --  140 144  K 3.7 3.4*  --  3.5 3.6  CL 100 98  --  100 100  CO2 28 33*  --  32 34*  GLUCOSE 91 128*  --  265* 133*  BUN 11 11  --  13 13  CREATININE 0.80 0.69  --  0.65 0.59  CALCIUM 8.8* 8.9  --  8.5* 9.2  MG  --   --  1.6*  --   --   PHOS  --   --  4.4  --   --    GFR: Estimated Creatinine Clearance: 88.5 mL/min (by C-G formula based on SCr of 0.59 mg/dL). Liver Function Tests: Recent Labs  Lab 10/30/19 1635 11/01/19 1215  AST 32 25  ALT 28 26  ALKPHOS 93 76  BILITOT 0.6 0.5  PROT 7.0 7.0  ALBUMIN 3.3* 3.2*   No results for input(s): LIPASE, AMYLASE in the  last 168 hours. No results for input(s): AMMONIA in the last 168 hours. Coagulation Profile: Recent Labs  Lab 10/30/19 1635  INR 1.0   Cardiac Enzymes: No results for input(s): CKTOTAL, CKMB, CKMBINDEX, TROPONINI in the last 168 hours. BNP (last 3 results) No results for input(s): PROBNP in the last 8760 hours. HbA1C: Recent Labs    10/31/19 0402  HGBA1C 7.7*   CBG: Recent Labs  Lab 11/01/19 1948 11/02/19 0007 11/02/19 0431 11/02/19 0744 11/02/19 1223  GLUCAP 165* 149* 115* 95 198*   Lipid Profile: No results for  input(s): CHOL, HDL, LDLCALC, TRIG, CHOLHDL, LDLDIRECT in the last 72 hours. Thyroid Function Tests: Recent Labs    10/31/19 0402  TSH 2.350   Anemia Panel: Recent Labs    10/31/19 0002  FERRITIN 268   Urine analysis:    Component Value Date/Time   COLORURINE STRAW (A) 10/30/2019 1635   APPEARANCEUR CLEAR 10/30/2019 1635   LABSPEC 1.006 10/30/2019 1635   PHURINE 5.0 10/30/2019 1635   GLUCOSEU NEGATIVE 10/30/2019 1635   GLUCOSEU NEGATIVE 01/05/2013 1535   HGBUR NEGATIVE 10/30/2019 1635   BILIRUBINUR NEGATIVE 10/30/2019 1635   BILIRUBINUR n 10/27/2014 1118   KETONESUR NEGATIVE 10/30/2019 1635   PROTEINUR NEGATIVE 10/30/2019 1635   UROBILINOGEN 0.2 10/27/2014 1118   UROBILINOGEN 1.0 12/23/2013 1344   NITRITE NEGATIVE 10/30/2019 1635   LEUKOCYTESUR SMALL (A) 10/30/2019 1635   Sepsis Labs: @LABRCNTIP (procalcitonin:4,lacticidven:4) ) Recent Results (from the past 240 hour(s))  Novel Coronavirus, NAA ([facility])     Status: None   Collection Time: 10/27/19 12:00 AM   Specimen: Nasopharyngeal(NP) swabs in vial transport medium   NASOPHARYNGE  TESTING  Result Value Ref Range Status   SARS-CoV-2, NAA Not Detected Not Detected Final    Comment: This nucleic acid amplification test was developed and its performance characteristics determined by [employer]. Nucleic acid amplification tests include PCR and TMA. This test has not been  FDA cleared or approved. This test has been authorized by FDA under an Emergency Use Authorization (EUA). This test is only authorized for the duration of time the declaration that circumstances exist justifying the authorization of the emergency use of in vitro diagnostic tests for detection of SARS-CoV-2 virus and/or diagnosis of COVID-19 infection under section 564(b)(1) of the Act, 21 U.S.C. [id number]) (1), unless the authorization is terminated or revoked sooner. When diagnostic testing is negative, the possibility of a false negative result should be considered in the context of a patient's recent exposures and the presence of clinical signs and symptoms consistent with COVID-19. An individual without symptoms of COVID-19 and who is not shedding SARS-CoV-2 virus would  expect to have a negative (not detected) result in this assay.   Blood Culture (routine x 2)     Status: None (Preliminary result)   Collection Time: 10/30/19  4:35 PM   Specimen: BLOOD LEFT ARM  Result Value Ref Range Status   Specimen Description   Final    BLOOD LEFT ARM Performed at [facility] [address], [place], [place] [address]    Special Requests   Final    BOTTLES DRAWN AEROBIC AND ANAEROBIC Blood Culture results may not be optimal due to an inadequate volume of blood received in culture bottles Performed at [facility] [address], [place], [place] [address]    Culture   Final    NO GROWTH 3 DAYS Performed at [facility], [place] [address] [place], [place] [address]    Report Status PENDING  Incomplete  Blood Culture (routine x 2)     Status: None (Preliminary result)   Collection Time: 10/30/19  4:35 PM   Specimen: BLOOD LEFT HAND  Result Value Ref Range Status   Specimen Description   Final    BLOOD LEFT HAND Performed at [facility] [address], [place],  [address]    Special Requests   Final    BOTTLES  DRAWN AEROBIC AND ANAEROBIC Blood Culture results may not be optimal due to an inadequate volume of blood  received in culture bottles Performed at [facility], [place] [address], [place], [place] [address]    Culture   Final    NO GROWTH 3 DAYS Performed at [facility], [place] [address], [place], [place] [address]    Report Status PENDING  Incomplete  Urine culture     Status: Abnormal   Collection Time: 10/30/19  4:35 PM   Specimen: In/Out Cath Urine  Result Value Ref Range Status   Specimen Description   Final    IN/OUT CATH URINE Performed at [facility] [address] [place], [place] [address]    Special Requests   Final    NONE Performed at [facility], [place] [address], [place], [place] [address]    Culture (A)  Final    <10,000 COLONIES/mL INSIGNIFICANT GROWTH Performed at [facility] [address], [place], [place] [address]    Report Status 11/01/2019 FINAL  Final  SARS CORONAVIRUS 2 (TAT 6-24 HRS) Nasopharyngeal Nasopharyngeal Swab     Status: None   Collection Time: 10/30/19  9:03 PM   Specimen: Nasopharyngeal Swab  Result Value Ref Range Status   SARS Coronavirus 2 NEGATIVE NEGATIVE Final    Comment: (NOTE) SARS-CoV-2 target nucleic acids are NOT DETECTED. The SARS-CoV-2 RNA is generally detectable in upper and lower respiratory specimens during the acute phase of infection. Negative results do not preclude SARS-CoV-2 infection, do not rule out co-infections with other pathogens, and should not be used as the sole basis for treatment or other patient management decisions. Negative results must be combined with clinical observations, patient history, and epidemiological information. The expected result is Negative. Fact Sheet for Patients: SugarRoll.be Fact Sheet for Healthcare Providers: https://www.woods-mathews.com/ This test is not yet approved or  cleared by the [place] FDA and  has been authorized for detection and/or diagnosis of SARS-CoV-2 by FDA under an Emergency Use Authorization (EUA). This EUA will remain  in effect (meaning this test can be used) for the duration of the COVID-19 declaration under Section 56 4(b)(1) of the Act, 21 U.S.C. section 360bbb-3(b)(1), unless the authorization is terminated or revoked sooner. Performed at [facility], [place] [address], [place], [place] [address]   Respiratory Panel by PCR     Status: None   Collection Time: 10/31/19 12:02 AM   Specimen: Nasopharyngeal Swab; Respiratory  Result Value Ref Range Status   Adenovirus NOT DETECTED NOT DETECTED Final   Coronavirus 229E NOT DETECTED NOT DETECTED Final    Comment: (NOTE) The Coronavirus on the Respiratory Panel, DOES NOT test for the novel  Coronavirus (2019 nCoV)    Coronavirus HKU1 NOT DETECTED NOT DETECTED Final   Coronavirus NL63 NOT DETECTED NOT DETECTED Final   Coronavirus OC43 NOT DETECTED NOT DETECTED Final   Metapneumovirus NOT DETECTED NOT DETECTED Final   Rhinovirus / Enterovirus NOT DETECTED NOT DETECTED Final   Influenza A NOT DETECTED NOT DETECTED Final   Influenza B NOT DETECTED NOT DETECTED Final   Parainfluenza Virus 1 NOT DETECTED NOT DETECTED Final   Parainfluenza Virus 2 NOT DETECTED NOT DETECTED Final   Parainfluenza Virus 3 NOT DETECTED NOT DETECTED Final   Parainfluenza Virus 4 NOT DETECTED NOT DETECTED Final   Respiratory Syncytial Virus NOT DETECTED NOT DETECTED Final   Bordetella pertussis NOT DETECTED NOT DETECTED Final   Chlamydophila pneumoniae NOT DETECTED NOT DETECTED Final   Mycoplasma pneumoniae NOT DETECTED NOT DETECTED Final    Comment:  Performed at [facility], [place] [address], [place], [place] [address]         Radiology Studies: Vas [place] Lower Extremity Venous (dvt)  Result Date: 11/01/2019  Lower Venous Study Indications: Swelling.  Limitations: Poor ultrasound/tissue  interface and body habitus. Comparison Study: No prior study. Performing Technologist: Maudry Mayhew MHA, RDMS, RVT, RDCS  Examination Guidelines: A complete evaluation includes B-mode imaging, spectral Doppler, color Doppler, and power Doppler as needed of all accessible portions of each vessel. Bilateral testing is considered an integral part of a complete examination. Limited examinations for reoccurring indications may be performed as noted.  +-----+---------------+---------+-----------+----------+--------------+ RIGHTCompressibilityPhasicitySpontaneityPropertiesThrombus Aging +-----+---------------+---------+-----------+----------+--------------+ CFV  Full           Yes      Yes                                 +-----+---------------+---------+-----------+----------+--------------+   +---------+---------------+---------+-----------+----------+--------------+ LEFT     CompressibilityPhasicitySpontaneityPropertiesThrombus Aging +---------+---------------+---------+-----------+----------+--------------+ CFV      Full           Yes      Yes                                 +---------+---------------+---------+-----------+----------+--------------+ SFJ      Full                                                        +---------+---------------+---------+-----------+----------+--------------+ FV Prox  Full                                                        +---------+---------------+---------+-----------+----------+--------------+ FV Mid   Full                                                        +---------+---------------+---------+-----------+----------+--------------+ FV DistalFull                                                        +---------+---------------+---------+-----------+----------+--------------+ PFV      Full                                                         +---------+---------------+---------+-----------+----------+--------------+ POP      Full           Yes      Yes                                 +---------+---------------+---------+-----------+----------+--------------+ PTV  Full                                                        +---------+---------------+---------+-----------+----------+--------------+ PERO     Full                                                        +---------+---------------+---------+-----------+----------+--------------+     Summary: Right: No evidence of common femoral vein obstruction. Left: There is no evidence of deep vein thrombosis in the lower extremity. No cystic structure found in the popliteal fossa.  *See table(s) above for measurements and observations. Electronically signed by Curt Jews MD on 11/01/2019 at 7:47:47 AM.    Final       Scheduled Meds: . aspirin EC  81 mg Oral QHS  . atorvastatin  40 mg Oral q1800  . azithromycin  500 mg Oral Daily  . Chlorhexidine Gluconate Cloth  6 each Topical Daily  . clobetasol cream   Topical BID  . doxepin  20 mg Oral QHS  . enoxaparin (LOVENOX) injection  40 mg Subcutaneous Daily  . furosemide  40 mg Intravenous BID  . insulin aspart  0-9 Units Subcutaneous Q4H  . mometasone-formoterol  1 puff Inhalation BID  . montelukast  10 mg Oral QHS  . pantoprazole  40 mg Oral Daily  . predniSONE  40 mg Oral Q breakfast  . pregabalin  150 mg Oral TID  . sodium chloride flush  3 mL Intravenous Q12H  . sodium chloride flush  3 mL Intravenous Q12H  . sucralfate  1 g Oral TID WC & HS  . vitamin B-12  500 mcg Oral Daily   Continuous Infusions: . sodium chloride       LOS: 3 days      Debbe Odea, MD [facility] Pager: [email or web address] Password [email or web address] 11/02/2019, 1:37 PM

## 2019-11-02 NOTE — Consult Note (Signed)
NAME:  AYANA IMHOF, MRN:  [id number], DOB:  19-Jun-1956, LOS: 3 ADMISSION DATE:  10/30/2019, CONSULTATION DATE:  11/29 REFERRING MD:  Wynelle Cleveland, CHIEF COMPLAINT:  Recurrent respiratory failure    Brief History   63 year old white female with current history of breast cancer undergoing chemotherapy (Cytoxan and docetaxel) just discharged November first after being treated for what was felt to be decompensated diastolic heart failure.  Readmitted 11/27 with acute hypoxic respiratory failure in the setting of diffuse bilateral groundglass pulmonary infiltrates  History of present illness   This is a pleasant 63 year old female patient followed by the pulmonary clinic in the outpatient setting for mild persistent asthma, recently discharged from The hospital on 11/1 for what was felt to be acute diastolic heart failure with pulmonary edema, she was discharged to home on diuretics 20 mg of Lasix daily and prednisone taper.  On 11/23 she had a telephone visit with our nurse practitioner at that point she was reporting worsening shortness of breath over approximately 1 week timeframe with approximately 2 pound weight gain.  She was having intermittent chest fullness, her cough was stable, no fever, was requiring supplemental oxygen at this point to keep saturations greater than 90%.  At that point it was felt she likely had some degree of decompensated heart dysfunction and it was recommended that she increase her Lasix.  Over the following 4 days her symptoms continued to decline with worsening shortness of breath pulse oximetry on room air was noted to be 70% and because of this she presented to the emergency room.  In the emergency room a CT of chest was obtained this was negative for pulmonary embolus but did show scattered groundglass opacities throughout both lung with some mild subsegmental atelectasis there is no obvious consolidation he was admitted to the medical service with initial treatment  including supplemental oxygen, steroids.  To 40 mg daily, and IV Lasix in addition to empiric antibiotics.  Because she has now been readmitted in less than 4 weeks with recurrent respiratory failure pulmonary is been asked to evaluate.  Past Medical History  Persistent asthma (FEV1 49% predicted), obstructive sleep apnea, atrial fibrillation, breast cancer diagnosed 2020 currently on Cytoxan and docetaxel, last therapy approximately 3 weeks ago she is completed 2 of 6 cycles.   Significant Hospital Events   11/27 admitted.  Started on IV Lasix, supplemental oxygen Intact antibiotics, and pulse steroids., Consults:  Pulmonary consulted  Procedures:    Significant Diagnostic Tests:  CT chest 11/27: Scattered groundglass pulmonary opacities throughout both lungs with subsegmental atelectasis negative for PE  10/29 echocardiogram:.  LVEF 60 to 65% no left ventricular hypertrophy, RV size was mildly enlarged  Micro Data:  Respiratory viral panel was -11/28 Blood cultures x211/27 - Urine culture 11/27: Insignificant growth COVID-19 11/27 - Antimicrobials:  azithromycin 11/27>>>   Interim history/subjective:  Feels better  Objective   Blood pressure 128/76, pulse 65, temperature 98.7 F (37.1 C), temperature source Oral, resp. rate 19, height '5\' 6"'  (1.676 m), weight 100.8 kg, SpO2 97 %.        Intake/Output Summary (Last 24 hours) at 11/02/2019 1545 Last data filed at 11/02/2019 1300 Gross per 24 hour  Intake 360 ml  Output 4200 ml  Net -3840 ml   Filed Weights   10/31/19 0242  Weight: 100.8 kg    Examination: General: Pleasant 63 year old white female resting comfortably in bed currently in no acute distress HENT: Normocephalic atraumatic no jugular venous distention no upper airway wheezing  Lungs: Clear to auscultation diminished bases Cardiovascular: Regular rhythm Abdomen: Soft not tender Extremities: Trace left lower extremity edema Neuro: Awake alert GU:  Voiding  Resolved Hospital Problem list     Assessment & Plan:   Acute on chronic hypoxic respiratory failure Diffuse groundglass pulmonary infiltrates Moderate persistent asthma Breast cancer Obstructive sleep apnea Severe GERD Morbid obesity Chronic back pain Diabetes   Pulmonary problem list: Acute on chronic hypoxic respiratory failure in the setting of diffuse groundglass pulmonary infiltrates  -Etiology unclear.  Certainly given her response to IV diuresis at least some component of this is likely volume overload/pulmonary edema however also need to consider possible drug-related pneumonitis (her dyspnea symptoms have been well controlled until starting chemotherapy, and symptoms seem to return shortly after prednisone taper completed), also considered but less likely infection or Progression of underlying malignancy Plan Continue IV diuresis as long as BUN and creatinine will tolerate Continue current prednisone dosing with slow taper Would complete 5 days of a azithromycin then can discontinue (do not think she is infected and see no indication for bronchoscopy at this point) Wean supplemental oxygen as tolerated will be important to identify home oxygen needs prior to discharge Might be reasonable to have oncology weigh in in regards to finances of her underlying chemotherapy Be sure to maximize reflux therapy, she has aspirated in the past and this is been a contributing factor to prior aspiration events (do not think this is currently the issue) Will need close pulmonary follow-up in the outpatient setting  Chronic fixed asthma -I do not think this is an asthmatic exacerbation Plan Continue Dulera  OSA Plan Nocturnal CPAP  Severe GERD Plan We will adjust therapy to be equal to home regimen   Labs   CBC: Recent Labs  Lab 10/26/19 1610 10/30/19 1635 10/31/19 0402 11/01/19 1215  WBC 20.5* 9.8 12.1* 13.8*  NEUTROABS 16.5* 7.9* 10.3*  --   HGB 12.4 11.8*  11.5* 11.6*  HCT 39.5 37.9 38.0 37.7  MCV 92.7 94.0 93.6 94.0  PLT 149* 171 178 902    Basic Metabolic Panel: Recent Labs  Lab 10/26/19 1610 10/30/19 1635 10/31/19 0402 11/01/19 1215 11/02/19 0420  NA 140 141  --  140 144  K 3.7 3.4*  --  3.5 3.6  CL 100 98  --  100 100  CO2 28 33*  --  32 34*  GLUCOSE 91 128*  --  265* 133*  BUN 11 11  --  13 13  CREATININE 0.80 0.69  --  0.65 0.59  CALCIUM 8.8* 8.9  --  8.5* 9.2  MG  --   --  1.6*  --   --   PHOS  --   --  4.4  --   --    GFR: Estimated Creatinine Clearance: 88.5 mL/min (by C-G formula based on SCr of 0.59 mg/dL). Recent Labs  Lab 10/26/19 1610 10/30/19 1635 10/31/19 0002 10/31/19 0402 11/01/19 1215  PROCALCITON  --   --  <0.10  --   --   WBC 20.5* 9.8  --  12.1* 13.8*  LATICACIDVEN  --  1.6  --  1.5  --     Liver Function Tests: Recent Labs  Lab 10/30/19 1635 11/01/19 1215  AST 32 25  ALT 28 26  ALKPHOS 93 76  BILITOT 0.6 0.5  PROT 7.0 7.0  ALBUMIN 3.3* 3.2*   No results for input(s): LIPASE, AMYLASE in the last 168 hours. No results for input(s): AMMONIA in  the last 168 hours.  ABG    Component Value Date/Time   PHART 7.393 08/14/2011 0501   PCO2ART 44.9 08/14/2011 0501   PO2ART 56.0 (L) 08/14/2011 0501   HCO3 27.1 10/31/2019 0002   TCO2 29 08/14/2011 0501   O2SAT 79.2 10/31/2019 0002     Coagulation Profile: Recent Labs  Lab 10/30/19 1635  INR 1.0    Cardiac Enzymes: No results for input(s): CKTOTAL, CKMB, CKMBINDEX, TROPONINI in the last 168 hours.  HbA1C: Hemoglobin A1C  Date/Time Value Ref Range Status  01/12/2017 5.9  Final    Comment:    See report-per health nurse   Hgb A1c MFr Bld  Date/Time Value Ref Range Status  10/31/2019 04:02 AM 7.7 (H) 4.8 - 5.6 % Final    Comment:    (NOTE) Pre diabetes:          5.7%-6.4% Diabetes:              >6.4% Glycemic control for   <7.0% adults with diabetes   09/17/2019 08:49 AM 6.8 (H) 4.8 - 5.6 % Final    Comment:     (NOTE) Pre diabetes:          5.7%-6.4% Diabetes:              >6.4% Glycemic control for   <7.0% adults with diabetes     CBG: Recent Labs  Lab 11/01/19 1948 11/02/19 0007 11/02/19 0431 11/02/19 0744 11/02/19 1223  GLUCAP 165* 149* 115* 95 198*    Review of Systems:   Review of Systems  Constitutional: Negative for fever and weight loss.  HENT: Negative.   Eyes: Negative.   Respiratory: Positive for cough, shortness of breath and wheezing. Negative for sputum production.   Cardiovascular: Positive for palpitations and leg swelling.  Gastrointestinal: Positive for heartburn and nausea.  Genitourinary: Negative.   Musculoskeletal: Negative.   Skin: Negative.   Neurological: Negative.   Endo/Heme/Allergies: Negative.   Psychiatric/Behavioral: Negative.      Past Medical History  She,  has a past medical history of Allergy, Anemia, Angina, Anxiety and depression, Arthritis, Asthma, Atrial fibrillation ([place]), Breast cancer ([place]), Cancer ([place]), CHF (congestive heart failure) ([place]), Chronic back pain greater than 3 months duration, Colon polyps, Coronary artery disease, Difficult intubation, Dysrhythmia, Esophageal stricture, Family history of melanoma (]), Family history of pancreatic cancer, Fatty liver, Fibroids, Fibromyalgia, GERD (gastroesophageal reflux disease), Headache(784.0), Heart murmur, Hiatal hernia, History of loop recorder, History of migraines, Hyperlipemia, Hypertension, Mental disorder, Mild episode of recurrent major depressive disorder ([place]) (12/06/2015), Myocardial infarction ([place]) (1980's & 1990;), OSA (obstructive sleep apnea), Pneumonia, PONV (postoperative nausea and vomiting), Recurrent upper respiratory infection (URI), Shortness of breath (11/20/11), Stenotic cervical os, Stomach ulcer, TMJ (dislocation of temporomandibular joint), Tuberculosis, and Type 2 diabetes mellitus without complication, without long-term current use of insulin ([place]) (12/06/2015).    Surgical History    Past Surgical History:  Procedure Laterality Date  . ACHILLES TENDON REPAIR  1970's   left ankle  . ARTHROSCOPIC REPAIR ACL     left knee cap  . BREAST BIOPSY Right 04/08/2013  . CARDIAC CATHETERIZATION     loop recorder  . CARPAL TUNNEL RELEASE  unknown   left hand  . ESOPHAGOGASTRODUODENOSCOPY (EGD) WITH PROPOFOL N/A 10/29/2017   Procedure: ESOPHAGOGASTRODUODENOSCOPY (EGD) WITH PROPOFOL;  Surgeon: Irene Shipper, MD;  Location: [facility];  Service: Endoscopy;  Laterality: N/A;  . LOOP RECORDER IMPLANT    . PORTACATH PLACEMENT N/A 09/23/2019   Procedure:  INSERTION PORT-A-CATH Right Internal Doreen Salvage;  Surgeon: Rolm Bookbinder, MD;  Location: [facility];  Service: General;  Laterality: N/A;  . post ganglionectomy  1970's   "for migraine headaches"  . pouch string  [phone]   "did this 3 times (once w/each pregnancy)"  . SAVORY DILATION N/A 10/29/2017   Procedure: SAVORY DILATION;  Surgeon: Irene Shipper, MD;  Location: [facility];  Service: Endoscopy;  Laterality: N/A;  . TOTAL KNEE ARTHROPLASTY Left 09/25/2016   Procedure: LEFT TOTAL KNEE ARTHROPLASTY;  Surgeon: Paralee Cancel, MD;  Location: [facility];  Service: Orthopedics;  Laterality: Left;  . TUBAL LIGATION  1980's     Social History   reports that she has never smoked. She has never used smokeless tobacco. She reports that she does not drink alcohol or use drugs.   Family History   Her family history includes Cancer in her brother, brother, father, paternal grandmother, sister, and sister; Cancer (age of onset: 83) in her brother; Cancer (age of onset: 40) in her nephew; Diabetes in her father and sister; Heart disease in her father, maternal grandfather, maternal grandmother, mother, paternal grandfather, paternal grandmother, and sister; Hypertension in her father, mother, sister, and sister; Malignant hyperthermia in her father; Multiple myeloma in her mother; Other (age of onset: 29) in  her maternal grandmother; Pancreatic cancer (age of onset: 8) in her niece. There is no history of Anesthesia problems, Hypotension, Pseudochol deficiency, Colon cancer, Esophageal cancer, Rectal cancer, Stomach cancer, or Breast cancer.   Allergies Allergies  Allergen Reactions  . Lodine [Etodolac] Anaphylaxis, Hives and Swelling  . Oxycontin [Oxycodone Hcl] Anaphylaxis    hives, trouble breathing, tongue swelling (Only Oxycontin) Tolerates plain oxycodone.  Marland Kitchen Penicillins Anaphylaxis    Told by a surgeon never to take it again. Has patient had a PCN reaction causing immediate rash, facial/tongue/throat swelling, SOB or lightheadedness with hypotension: Yes Has patient had a PCN reaction causing severe rash involving mucus membranes or skin necrosis: Unknown Has patient had a PCN reaction that required hospitalization: No Has patient had a PCN reaction occurring within the last 10 years: No If all of the above answers are "NO", then may proceed with Cephalosporin use.  . Aspirin Other (See Comments)    High-dose caused GI Bleeds  . Darvocet [Propoxyphene N-Acetaminophen] Hives  . Nitroglycerin     IV-BP drops dramatically  . Tramadol Hives and Itching  . Ultram [Tramadol Hcl] Hives  . Valium Other (See Comments)    Circulation problems. "Legs turned black".     Home Medications  Prior to Admission medications   Medication Sig Start Date End Date Taking? Authorizing Provider  albuterol (PROVENTIL) (2.5 MG/3ML) 0.083% nebulizer solution Take 3 mLs (2.5 mg total) by nebulization every 6 (six) hours as needed for wheezing or shortness of breath (J45.40). 09/19/16  Yes Javier Glazier, MD  albuterol (VENTOLIN HFA) 108 (90 Base) MCG/ACT inhaler Inhale 2 puffs into the lungs every 6 (six) hours as needed for wheezing or shortness of breath. 09/07/19  Yes Rigoberto Noel, MD  aspirin EC 81 MG tablet Take 81 mg at bedtime by mouth.    Yes [provider]  atorvastatin (LIPITOR) 40  MG tablet TAKE 1 TABLET BY MOUTH EVERY DAY Patient taking differently: Take 40 mg by mouth daily.  09/16/19  Yes Colin Benton R, DO  budesonide-formoterol (SYMBICORT) 80-4.5 MCG/ACT inhaler INHALE 2 PUFFS INTO THE LUNGS EVERY MORNING AND ANOTHER 2 PUFFS 12 HOURS LATER Patient taking differently: Inhale  2 puffs into the lungs 2 (two) times daily.  12/16/18  Yes Martyn Ehrich, NP  CINNAMON PO Take 1,000 mg 2 (two) times daily by mouth.   Yes [provider]  clobetasol cream (TEMOVATE) 0.05 % Apply topically 2 (two) times daily. 10/04/19  Yes Georgette Shell, MD  doxepin (SINEQUAN) 10 MG capsule Take 20 mg by mouth at bedtime.  03/27/19  Yes [provider]  esomeprazole (NEXIUM) 40 MG capsule Take 1 capsule (40 mg total) by mouth 2 (two) times daily. 08/19/19  Yes Irene Shipper, MD  fluticasone [facility]) 50 MCG/ACT nasal spray Place 2 sprays at bedtime as needed into both nostrils for allergies or rhinitis.    Yes [provider]  furosemide (LASIX) 20 MG tablet 1-2 daily as directed. Patient taking differently: Take 20-40 mg by mouth See admin instructions. Take 41m daily and may take another 281mwhen directed from doctor. 10/26/19  Yes Parrett, Tammy S, NP  glipiZIDE (GLUCOTROL XL) 5 MG 24 hr tablet TAKE 1 TABLET BY MOUTH EVERY DAY WITH BREAKFAST Patient taking differently: Take 5 mg by mouth daily with breakfast.  06/22/19  Yes KiLucretia KernDO  HYDROcodone-acetaminophen (NORCO) 10-325 MG tablet Take 1 tablet by mouth every 6 (six) hours as needed. Patient taking differently: Take 1 tablet by mouth every 6 (six) hours as needed for moderate pain.  09/23/19 09/22/20 Yes WaRolm BookbinderMD  L-Methylfolate (DEPLIN) 7.5 MG TABS Take 7.5 mg by mouth daily with breakfast.    Yes [provider]  lidocaine-prilocaine (EMLA) cream Apply to affected area once Patient taking differently: Apply 1 application topically daily as needed (numbing).  09/10/19  Yes  GuNicholas LoseMD  LORazepam (ATIVAN) 0.5 MG tablet Take 1 tablet (0.5 mg total) by mouth at bedtime as needed for sleep. 09/10/19  Yes GuNicholas LoseMD  LYRICA 150 MG capsule Take 150 mg by mouth 3 (three) times daily.  05/28/18  Yes [provider]  metFORMIN (GLUCOPHAGE) 500 MG tablet TAKE 1 TABLET (500 MG TOTAL) BY MOUTH 2 (TWO) TIMES DAILY WITH A MEAL. 09/28/19  Yes Koberlein, JuSteele BergMD  Misc Natural Products (GLUCOS-CHONDROIT-MSM COMPLEX PO) Take 1 tablet 3 (three) times daily by mouth.    Yes [provider]  mometasone (ELOCON) 0.1 % cream Apply 1 application topically daily as needed (irritation).  05/17/19  Yes [provider]  montelukast (SINGULAIR) 10 MG tablet TAKE 1 TABLET BY MOUTH EVERYDAY AT BEDTIME Patient taking differently: Take 10 mg by mouth at bedtime.  08/13/19  Yes AlRigoberto NoelMD  Multiple Vitamin (MULITIVITAMIN WITH MINERALS) TABS Take 1 tablet by mouth daily.     Yes [provider]  nitroGLYCERIN (NITROSTAT) 0.4 MG SL tablet Place 0.4 mg under the tongue every 5 (five) minutes as needed for chest pain.    Yes [provider]  ondansetron (ZOFRAN) 8 MG tablet Take 1 tablet (8 mg total) by mouth 2 (two) times daily as needed for refractory nausea / vomiting. Start on day 3 after chemo. 09/10/19  Yes GuNicholas LoseMD  Oxycodone HCl 20 MG TABS Take 20 mg every 4 (four) hours as needed by mouth (pain).  08/09/17  Yes [provider]  potassium chloride (K-DUR) 10 MEQ tablet TAKE 2 TABLETS BY MOUTH EVERY DAY Patient taking differently: Take 20 mEq by mouth daily.  07/23/19  Yes KiColin Benton, DO  prochlorperazine (COMPAZINE) 10 MG tablet Take 1 tablet (10 mg total)  by mouth every 6 (six) hours as needed (Nausea or vomiting). 09/10/19  Yes Nicholas Lose, MD  promethazine (PHENERGAN) 25 MG tablet Take 1 tablet (25 mg total) by mouth every 6 (six) hours as needed for nausea or vomiting. 08/19/19  Yes Irene Shipper, MD  sucralfate  (CARAFATE) 1 g tablet Take 1 tablet (1 g total) by mouth 4 (four) times daily -  with meals and at bedtime. Before taking slowly dissolve tablet in 1 Tablespoon of distilled water for about 15 minutes to creat a slurry. 08/19/19  Yes Irene Shipper, MD  tiZANidine (ZANAFLEX) 4 MG tablet Take 4 mg by mouth every 8 (eight) hours as needed for muscle spasms.   Yes [provider]  vitamin B-12 (CYANOCOBALAMIN) 500 MCG tablet Take 500 mcg by mouth daily.    Yes [provider]  blood glucose meter kit and supplies KIT Dispense based on patient and insurance preference. Use to check sugars up to twice daily. 10/16/19   Caren Macadam, MD  OXYGEN Inhale 2 L into the lungs at bedtime as needed (shortness of breath).     [provider]  predniSONE (DELTASONE) 10 MG tablet Take 4 tablets daily for 4 days then 3 tablets daily for 4 days then 2 tablets daily for 4 days then 1 tablet daily till done. Patient not taking: Reported on 10/30/2019 10/04/19   Georgette Shell, MD     Erick Colace ACNP-BC [place] Pager # [phone] OR # [phone] if no answer

## 2019-11-02 NOTE — Progress Notes (Signed)
LMOMTCB x 1 

## 2019-11-03 DIAGNOSIS — G8929 Other chronic pain: Secondary | ICD-10-CM

## 2019-11-03 DIAGNOSIS — M545 Low back pain: Secondary | ICD-10-CM

## 2019-11-03 DIAGNOSIS — Z171 Estrogen receptor negative status [ER-]: Secondary | ICD-10-CM

## 2019-11-03 DIAGNOSIS — I2510 Atherosclerotic heart disease of native coronary artery without angina pectoris: Secondary | ICD-10-CM

## 2019-11-03 DIAGNOSIS — J9601 Acute respiratory failure with hypoxia: Secondary | ICD-10-CM

## 2019-11-03 DIAGNOSIS — C50411 Malignant neoplasm of upper-outer quadrant of right female breast: Secondary | ICD-10-CM

## 2019-11-03 DIAGNOSIS — I5032 Chronic diastolic (congestive) heart failure: Secondary | ICD-10-CM

## 2019-11-03 LAB — BASIC METABOLIC PANEL
Anion gap: 10 (ref 5–15)
BUN: 24 mg/dL — ABNORMAL HIGH (ref 8–23)
CO2: 33 mmol/L — ABNORMAL HIGH (ref 22–32)
Calcium: 9.1 mg/dL (ref 8.9–10.3)
Chloride: 101 mmol/L (ref 98–111)
Creatinine, Ser: 0.68 mg/dL (ref 0.44–1.00)
GFR calc Af Amer: >60 mL/min (ref >60)
GFR calc non Af Amer: >60 mL/min (ref >60)
Glucose, Bld: 126 mg/dL — ABNORMAL HIGH (ref 70–99)
Potassium: 3.6 mmol/L (ref 3.5–5.1)
Sodium: 144 mmol/L (ref 135–145)

## 2019-11-03 LAB — GLUCOSE, CAPILLARY
Glucose-Capillary: 111 mg/dL — ABNORMAL HIGH (ref 70–99)
Glucose-Capillary: 102 mg/dL — ABNORMAL HIGH (ref 70–99)
Glucose-Capillary: 288 mg/dL — ABNORMAL HIGH (ref 70–99)
Glucose-Capillary: 226 mg/dL — ABNORMAL HIGH (ref 70–99)

## 2019-11-03 MED ORDER — FAMOTIDINE 20 MG PO TABS: 20.0000 mg | ORAL_TABLET | Freq: Two times a day (BID) | ORAL | 1 refills | Status: DC | PRN

## 2019-11-03 MED ORDER — INSULIN ASPART 100 UNIT/ML ~~LOC~~ SOLN
0.0000 [IU] | Freq: Three times a day (TID) | SUBCUTANEOUS | Status: DC
  Administered 2019-11-03: 3 [IU] via SUBCUTANEOUS
  Administered 2019-11-03: 5 [IU] via SUBCUTANEOUS

## 2019-11-03 MED ORDER — HEPARIN SOD (PORK) LOCK FLUSH 100 UNIT/ML IV SOLN
250.0000 [IU] | INTRAVENOUS | Status: AC | PRN
  Administered 2019-11-03: 250 [IU]
  Filled 2019-11-03: qty 2.5

## 2019-11-03 MED ORDER — PREDNISONE 10 MG PO TABS: ORAL_TABLET | ORAL | 0 refills | Status: DC

## 2019-11-03 NOTE — Discharge Summary (Signed)
Physician Discharge Summary  Shelby Anderson AYT:[id number] DOB: [birth year] DOA: 10/30/2019  PCP: Caren Macadam, MD  Admit date: 10/30/2019 Discharge date: 11/03/2019  Admitted From: home  Disposition:  home   Recommendations for Outpatient Follow-up:  1. Wean O2 as able 2. Follow for GI side effect of Prednisone   Discharge Condition:  stable   CODE STATUS:  Full code   Diet recommendation:  diabetic Consultations:  PCCM  Oncology     Discharge Diagnoses:  Principal Problem:   Acute respiratory failure with hypoxia (HCC) Active Problems:   Coronary artery disease   OSA (obstructive sleep apnea)   Hypertension associated with diabetes (HCC)   GERD (gastroesophageal reflux disease)   Chronic back pain   Type 2 diabetes mellitus without complication, without long-term current use of insulin (HCC)   Morbid obesity due to excess calories ([place])   Hyperlipidemia associated with type 2 diabetes mellitus ([place])   Malignant neoplasm of upper-outer quadrant of right breast in female, estrogen receptor negative       Brief Summary: Shelby Anderson 63 y.o.femalewith medical history significant of asthma COPD and sleep apnea breast cancer currently undergoing chemotherapy, DM2, chronic back pain, paroxysmal atrial fibrillation, peptic ulcer disease, GERD   Presented withworsening shortness of breath for about a week has been having some wheezing as well occasionally feeling somewhat confused she was advised to go to emergency department She was taken some extra Lasix at home to see if that would help with her shortness of breath.  She reported leg swelling left worse than the right but she says she usually accumulates a fluid mainly in her left leg.  She checked her oxygen saturation at home and it was as low 70% room air while walking. K no nausea no vomiting  currently seeing no fevers.   Recent episode of worsening wheezing associated low-grade fever  she had a tentative visit to her pulmonologist on 23 November reporting increased shortness of breath she started to back using oxygen at 2 L which kept her oxygen saturation around 90s she is usually not using oxygen on a regular basis  Last admission was about a month ago for decompensated diastolic heart failure neutropenic fever she at that time was treated with IV Lasix 2D echo during her admission showed normal EF CAT scan showed no evidence of PE  She was admitted for acute respiratory failure thought to be secondary to acute bronchitis and a COPD exacerbation.  Hospital Course:  Principal Problem:   Acute respiratory failure with hypoxia  - initially suspected to be due to acute COPD exacerbation and acute on chronic diastolic heart failure -Covid negative - despite treatment for COPD and CHF with steroids, nebs, Azithromycin and Lasix, she continues to require 2 L of oxygen in the hospital-on ambulation her pulse ox is still dropping to the 80s on room air - PCCM consulted due to lack of improvement- per PCCM, she may have a pneumonitis which might be related to her chemo- they have recommended a slow Prednisone taper (10 mg every 5 days) and to complete a course of 5 days of Azithromycin (completed today) - she is stable to go home today but will require 2 O2 which I have ordered as well - She will f/u with PCCM as outpt prior to finishing her taper   Active Problems:   Breast cancer - receiving chemotherapy- last treatment last week- oncology has been made aware that chemo may be the source of her respiratory  issues    OSA (obstructive sleep apnea) -Continue CPAP at bedtime    GERD (gastroesophageal reflux disease) - Protonix + Sucralfate    Chronic back pain -cont home doses of narcotics which we have not changed during her hospital stay    Type 2 diabetes mellitus without complication, without long-term current use of insulin  - cont SSI- A1 c 7.7     Morbid  obesity due to excess calories (HCC) Body mass index is 35.86 kg/m.        Discharge Exam: Vitals:   11/03/19 0416 11/03/19 0843  BP: 117/72   Pulse: (!) 57   Resp: 18   Temp: 97.8 F (36.6 C)   SpO2: 98% 90%   Vitals:   11/02/19 2100 11/03/19 0209 11/03/19 0416 11/03/19 0843  BP: 120/68 121/65 117/72   Pulse: 77 76 (!) 57   Resp: '20 20 18   ' Temp: 98.3 F (36.8 C) 98.7 F (37.1 C) 97.8 F (36.6 C)   TempSrc: Oral Oral Oral   SpO2: 90% 92% 98% 90%  Weight:      Height:        General: Pt is alert, awake, not in acute distress Cardiovascular: RRR, S1/S2 +, no rubs, no gallops Respiratory: CTA bilaterally, no wheezing, no rhonchi Abdominal: Soft, NT, ND, bowel sounds + Extremities: no edema, no cyanosis   Discharge Instructions  Discharge Instructions    AMB Referral to [facility]   Complete by: As directed    Please refer to telephonic RN for assessment of community needs and chronic disease management.  Netta Cedars, MSN, RN [facility] Liaison Nurse Mobile Phone [phone]  Toll free office [phone]   Reason for consult: Post hospital follow up   Diagnoses of:  Diabetes COPD/ Pneumonia     Expected date of contact: 1-3 days (reserved for hospital discharges)   Diet - low sodium heart healthy   Complete by: As directed    Increase activity slowly   Complete by: As directed      Allergies as of 11/03/2019      Reactions   Lodine [etodolac] Anaphylaxis, Hives, Swelling   Oxycontin [oxycodone Hcl] Anaphylaxis   hives, trouble breathing, tongue swelling (Only Oxycontin) Tolerates plain oxycodone.   Penicillins Anaphylaxis   Told by a surgeon never to take it again. Has patient had a PCN reaction causing immediate rash, facial/tongue/throat swelling, SOB or lightheadedness with hypotension: Yes Has patient had a PCN reaction causing severe rash involving mucus membranes or skin necrosis: Unknown Has patient had a PCN  reaction that required hospitalization: No Has patient had a PCN reaction occurring within the last 10 years: No If all of the above answers are "NO", then may proceed with Cephalosporin use.   Aspirin Other (See Comments)   High-dose caused GI Bleeds   Darvocet [propoxyphene N-acetaminophen] Hives   Nitroglycerin    IV-BP drops dramatically   Tramadol Hives, Itching   Ultram [tramadol Hcl] Hives   Valium Other (See Comments)   Circulation problems. "Legs turned black".      Medication List    STOP taking these medications   lidocaine-prilocaine cream Commonly known as: EMLA     TAKE these medications   albuterol (2.5 MG/3ML) 0.083% nebulizer solution Commonly known as: PROVENTIL Take 3 mLs (2.5 mg total) by nebulization every 6 (six) hours as needed for wheezing or shortness of breath (J45.40).   albuterol 108 (90 Base) MCG/ACT inhaler Commonly known as: VENTOLIN HFA Inhale  2 puffs into the lungs every 6 (six) hours as needed for wheezing or shortness of breath.   aspirin EC 81 MG tablet Take 81 mg at bedtime by mouth.   atorvastatin 40 MG tablet Commonly known as: LIPITOR TAKE 1 TABLET BY MOUTH EVERY DAY   blood glucose meter kit and supplies Kit Dispense based on patient and insurance preference. Use to check sugars up to twice daily.   budesonide-formoterol 80-4.5 MCG/ACT inhaler Commonly known as: Symbicort INHALE 2 PUFFS INTO THE LUNGS EVERY MORNING AND ANOTHER 2 PUFFS 12 HOURS LATER What changed:   how much to take  how to take this  when to take this  additional instructions   CINNAMON PO Take 1,000 mg 2 (two) times daily by mouth.   clobetasol cream 0.05 % Commonly known as: TEMOVATE Apply topically 2 (two) times daily.   Deplin 7.5 MG Tabs Take 7.5 mg by mouth daily with breakfast.   doxepin 10 MG capsule Commonly known as: SINEQUAN Take 20 mg by mouth at bedtime.   esomeprazole 40 MG capsule Commonly known as: NexIUM Take 1 capsule (40  mg total) by mouth 2 (two) times daily.   famotidine 20 MG tablet Commonly known as: PEPCID Take 1 tablet (20 mg total) by mouth 2 (two) times daily as needed for heartburn or indigestion.   fluticasone 50 MCG/ACT nasal spray Commonly known as: FLONASE Place 2 sprays at bedtime as needed into both nostrils for allergies or rhinitis.   furosemide 20 MG tablet Commonly known as: Lasix 1-2 daily as directed. What changed:   how much to take  how to take this  when to take this  additional instructions   glipiZIDE 5 MG 24 hr tablet Commonly known as: GLUCOTROL XL TAKE 1 TABLET BY MOUTH EVERY DAY WITH BREAKFAST What changed: See the new instructions.   GLUCOS-CHONDROIT-MSM COMPLEX PO Take 1 tablet 3 (three) times daily by mouth.   HYDROcodone-acetaminophen 10-325 MG tablet Commonly known as: NORCO Take 1 tablet by mouth every 6 (six) hours as needed. What changed: reasons to take this   LORazepam 0.5 MG tablet Commonly known as: Ativan Take 1 tablet (0.5 mg total) by mouth at bedtime as needed for sleep.   Lyrica 150 MG capsule Generic drug: pregabalin Take 150 mg by mouth 3 (three) times daily.   metFORMIN 500 MG tablet Commonly known as: GLUCOPHAGE TAKE 1 TABLET (500 MG TOTAL) BY MOUTH 2 (TWO) TIMES DAILY WITH A MEAL.   mometasone 0.1 % cream Commonly known as: ELOCON Apply 1 application topically daily as needed (irritation).   montelukast 10 MG tablet Commonly known as: SINGULAIR TAKE 1 TABLET BY MOUTH EVERYDAY AT BEDTIME What changed: See the new instructions.   multivitamin with minerals Tabs tablet Take 1 tablet by mouth daily.   nitroGLYCERIN 0.4 MG SL tablet Commonly known as: NITROSTAT Place 0.4 mg under the tongue every 5 (five) minutes as needed for chest pain.   ondansetron 8 MG tablet Commonly known as: Zofran Take 1 tablet (8 mg total) by mouth 2 (two) times daily as needed for refractory nausea / vomiting. Start on day 3 after chemo.    Oxycodone HCl 20 MG Tabs Take 20 mg every 4 (four) hours as needed by mouth (pain).   OXYGEN Inhale 2 L into the lungs at bedtime as needed (shortness of breath).   potassium chloride 10 MEQ tablet Commonly known as: KLOR-CON TAKE 2 TABLETS BY MOUTH EVERY DAY   predniSONE 10 MG  tablet Commonly known as: DELTASONE Start with 40 mg daily and decrease by 10 mg every 5 days. What changed: additional instructions   prochlorperazine 10 MG tablet Commonly known as: COMPAZINE Take 1 tablet (10 mg total) by mouth every 6 (six) hours as needed (Nausea or vomiting).   promethazine 25 MG tablet Commonly known as: PHENERGAN Take 1 tablet (25 mg total) by mouth every 6 (six) hours as needed for nausea or vomiting.   sucralfate 1 g tablet Commonly known as: Carafate Take 1 tablet (1 g total) by mouth 4 (four) times daily -  with meals and at bedtime. Before taking slowly dissolve tablet in 1 Tablespoon of distilled water for about 15 minutes to creat a slurry.   tiZANidine 4 MG tablet Commonly known as: ZANAFLEX Take 4 mg by mouth every 8 (eight) hours as needed for muscle spasms.   vitamin B-12 500 MCG tablet Commonly known as: CYANOCOBALAMIN Take 500 mcg by mouth daily.      Follow-up Information    Rigoberto Noel, MD Follow up on 11/17/2019.   Specialty: Pulmonary Disease Why: 3pm  Contact information: [address] [place] [address] [phone]          Allergies  Allergen Reactions  . Lodine [Etodolac] Anaphylaxis, Hives and Swelling  . Oxycontin [Oxycodone Hcl] Anaphylaxis    hives, trouble breathing, tongue swelling (Only Oxycontin) Tolerates plain oxycodone.  Marland Kitchen Penicillins Anaphylaxis    Told by a surgeon never to take it again. Has patient had a PCN reaction causing immediate rash, facial/tongue/throat swelling, SOB or lightheadedness with hypotension: Yes Has patient had a PCN reaction causing severe rash involving mucus membranes or skin  necrosis: Unknown Has patient had a PCN reaction that required hospitalization: No Has patient had a PCN reaction occurring within the last 10 years: No If all of the above answers are "NO", then may proceed with Cephalosporin use.  . Aspirin Other (See Comments)    High-dose caused GI Bleeds  . Darvocet [Propoxyphene N-Acetaminophen] Hives  . Nitroglycerin     IV-BP drops dramatically  . Tramadol Hives and Itching  . Ultram [Tramadol Hcl] Hives  . Valium Other (See Comments)    Circulation problems. "Legs turned black".     Procedures/Studies:    Dg Chest 2 View  Result Date: 11/02/2019 CLINICAL DATA:  Shortness of breath, hypoxia EXAM: CHEST - 2 VIEW COMPARISON:  October 30, 2019 FINDINGS: Right chest wall port catheter tip is unchanged. No new consolidation or edema. Opacities on CT are not well seen. No pleural effusion. No pneumothorax. Stable cardiomediastinal contours. IMPRESSION: No new findings. Electronically Signed   By: Macy Mis M.D.   On: 11/02/2019 14:48   Dg Chest 2 View  Result Date: 10/26/2019 CLINICAL DATA:  Shortness of breath EXAM: CHEST - 2 VIEW COMPARISON:  10/03/2019 FINDINGS: There is a well-positioned right-sided Port-A-Cath. There is a loop recorder in place. There is no pneumothorax. No large pleural effusion. There is atelectasis involving the lung bases bilaterally. There may be trace bilateral pleural effusions. There is no acute osseous abnormality. IMPRESSION: 1. Bibasilar atelectasis. 2. Possible trace bilateral pleural effusions. 3. Well-positioned right-sided Port-A-Cath. Electronically Signed   By: Constance Holster M.D.   On: 10/26/2019 16:09   Ct Angio Chest Pe W And/or Wo Contrast  Result Date: 10/30/2019 CLINICAL DATA:  Shortness of breath EXAM: CT ANGIOGRAPHY CHEST WITH CONTRAST TECHNIQUE: Multidetector CT imaging of the chest was performed using the standard protocol  during bolus administration of intravenous contrast. Multiplanar CT  image reconstructions and MIPs were obtained to evaluate the vascular anatomy. CONTRAST:  164m OMNIPAQUE IOHEXOL 350 MG/ML SOLN COMPARISON:  Chest x-ray October 30, 2019. CTA of the chest October 01, 2019. FINDINGS: Cardiovascular: The thoracic aorta demonstrates no aneurysm or dissection. Coronary artery calcifications are identified involving the LAD. Heart is stable. Evaluation of pulmonary arteries beyond the segmental branches is limited due to respiratory motion. Within this limitation, no pulmonary emboli identified. Mediastinum/Nodes: No enlarged mediastinal, hilar, or axillary lymph nodes. Thyroid gland, trachea, and esophagus demonstrate no significant findings. Lungs/Pleura: Central airways. No pneumothorax. Patchy pulmonary opacities are identified, primarily ground-glass in nature. No consolidation identified. No nodules masses. Upper Abdomen: No acute abnormality. Musculoskeletal: No chest wall abnormality. No acute or significant osseous findings. Review of the MIP images confirms the above findings. IMPRESSION: 1. Scattered ground-glass opacities throughout the lungs may represent mild edema, scattered subsegmental atelectasis, or atypical infection. No consolidation. 2. No central pulmonary emboli identified. Evaluation beyond the central pulmonary arteries is somewhat limited due to motion but no emboli are noted. 3. Coronary artery calcifications involving the LAD. 4. No other abnormalities. Aortic Atherosclerosis (ICD10-I70.0). Electronically Signed   By: DDorise BullionIII M.D   On: 10/30/2019 21:54   Dg Chest Port 1 View  Result Date: 10/30/2019 CLINICAL DATA:  Shortness of breath EXAM: PORTABLE CHEST 1 VIEW COMPARISON:  10/26/2019 FINDINGS: Cardiomegaly. Implantable loop recorder. Right chest port catheter. Both lungs are clear. The visualized skeletal structures are unremarkable. IMPRESSION: Cardiomegaly without acute abnormality of the lungs in AP portable projection. Electronically  Signed   By: AEddie CandleM.D.   On: 10/30/2019 16:40   Vas UKoreaLower Extremity Venous (dvt)  Result Date: 11/01/2019  Lower Venous Study Indications: Swelling.  Limitations: Poor ultrasound/tissue interface and body habitus. Comparison Study: No prior study. Performing Technologist: MMaudry MayhewMHA, RDMS, RVT, RDCS  Examination Guidelines: A complete evaluation includes B-mode imaging, spectral Doppler, color Doppler, and power Doppler as needed of all accessible portions of each vessel. Bilateral testing is considered an integral part of a complete examination. Limited examinations for reoccurring indications may be performed as noted.  +-----+---------------+---------+-----------+----------+--------------+ RIGHTCompressibilityPhasicitySpontaneityPropertiesThrombus Aging +-----+---------------+---------+-----------+----------+--------------+ CFV  Full           Yes      Yes                                 +-----+---------------+---------+-----------+----------+--------------+   +---------+---------------+---------+-----------+----------+--------------+ LEFT     CompressibilityPhasicitySpontaneityPropertiesThrombus Aging +---------+---------------+---------+-----------+----------+--------------+ CFV      Full           Yes      Yes                                 +---------+---------------+---------+-----------+----------+--------------+ SFJ      Full                                                        +---------+---------------+---------+-----------+----------+--------------+ FV Prox  Full                                                        +---------+---------------+---------+-----------+----------+--------------+  FV Mid   Full                                                        +---------+---------------+---------+-----------+----------+--------------+ FV DistalFull                                                         +---------+---------------+---------+-----------+----------+--------------+ PFV      Full                                                        +---------+---------------+---------+-----------+----------+--------------+ POP      Full           Yes      Yes                                 +---------+---------------+---------+-----------+----------+--------------+ PTV      Full                                                        +---------+---------------+---------+-----------+----------+--------------+ PERO     Full                                                        +---------+---------------+---------+-----------+----------+--------------+     Summary: Right: No evidence of common femoral vein obstruction. Left: There is no evidence of deep vein thrombosis in the lower extremity. No cystic structure found in the popliteal fossa.  *See table(s) above for measurements and observations. Electronically signed by Curt Jews MD on 11/01/2019 at 7:47:47 AM.    Final      The results of significant diagnostics from this hospitalization (including imaging, microbiology, ancillary and laboratory) are listed below for reference.     Microbiology: Recent Results (from the past 240 hour(s))  Novel Coronavirus, NAA ([facility])     Status: None   Collection Time: 10/27/19 12:00 AM   Specimen: Nasopharyngeal(NP) swabs in vial transport medium   NASOPHARYNGE  TESTING  Result Value Ref Range Status   SARS-CoV-2, NAA Not Detected Not Detected Final    Comment: This nucleic acid amplification test was developed and its performance characteristics determined by [employer]. Nucleic acid amplification tests include PCR and TMA. This test has not been FDA cleared or approved. This test has been authorized by FDA under an Emergency Use Authorization (EUA). This test is only authorized for the duration of time the declaration that circumstances exist justifying the authorization  of the emergency use of in vitro diagnostic tests for detection of SARS-CoV-2 virus and/or diagnosis of COVID-19 infection under section 564(b)(1) of the Act, 21 U.S.C.  360bbb-3(b) (1), unless the authorization is terminated or revoked sooner. When diagnostic testing is negative, the possibility of a false negative result should be considered in the context of a patient's recent exposures and the presence of clinical signs and symptoms consistent with COVID-19. An individual without symptoms of COVID-19 and who is not shedding SARS-CoV-2 virus would  expect to have a negative (not detected) result in this assay.   Blood Culture (routine x 2)     Status: None (Preliminary result)   Collection Time: 10/30/19  4:35 PM   Specimen: BLOOD LEFT ARM  Result Value Ref Range Status   Specimen Description   Final    BLOOD LEFT ARM Performed at [facility] [address] [place], [place] [address]    Special Requests   Final    BOTTLES DRAWN AEROBIC AND ANAEROBIC Blood Culture results may not be optimal due to an inadequate volume of blood received in culture bottles Performed at [facility] [address] [place], [place] [address]    Culture   Final    NO GROWTH 4 DAYS Performed at [facility], [place] [address], [place], [place] [address]    Report Status PENDING  Incomplete  Blood Culture (routine x 2)     Status: None (Preliminary result)   Collection Time: 10/30/19  4:35 PM   Specimen: BLOOD LEFT HAND  Result Value Ref Range Status   Specimen Description   Final    BLOOD LEFT HAND Performed at [facility] [address], [place], [place] [address]    Special Requests   Final    BOTTLES DRAWN AEROBIC AND ANAEROBIC Blood Culture results may not be optimal due to an inadequate volume of blood received in culture bottles Performed at [facility] [address], [place], [place] [address]     Culture   Final    NO GROWTH 4 DAYS Performed at [facility], [place] [address] [place], [place] [address]    Report Status PENDING  Incomplete  Urine culture     Status: Abnormal   Collection Time: 10/30/19  4:35 PM   Specimen: In/Out Cath Urine  Result Value Ref Range Status   Specimen Description   Final    IN/OUT CATH URINE Performed at [facility] [address] [place], [place] [address]    Special Requests   Final    NONE Performed at [facility], [place] [address], [place], [place] [address]    Culture (A)  Final    <10,000 COLONIES/mL INSIGNIFICANT GROWTH Performed at [facility] [address] [place], [place] [address]    Report Status 11/01/2019 FINAL  Final  SARS CORONAVIRUS 2 (TAT 6-24 HRS) Nasopharyngeal Nasopharyngeal Swab     Status: None   Collection Time: 10/30/19  9:03 PM   Specimen: Nasopharyngeal Swab  Result Value Ref Range Status   SARS Coronavirus 2 NEGATIVE NEGATIVE Final    Comment: (NOTE) SARS-CoV-2 target nucleic acids are NOT DETECTED. The SARS-CoV-2 RNA is generally detectable in upper and lower respiratory specimens during the acute phase of infection. Negative results do not preclude SARS-CoV-2 infection, do not rule out co-infections with other pathogens, and should not be used as the sole basis for treatment or other patient management decisions. Negative results must be combined with clinical observations, patient history, and epidemiological information. The expected result is Negative. Fact Sheet for Patients: [employer] Fact  Sheet for Healthcare Providers: https://www.woods-mathews.com/ This test is not yet approved or cleared by the [place] FDA and  has been authorized for detection and/or diagnosis of SARS-CoV-2 by FDA under an Emergency Use Authorization (EUA). This EUA will remain  in effect (meaning this test can be used) for the  duration of the COVID-19 declaration under Section 56 4(b)(1) of the Act, 21 U.S.C. section 360bbb-3(b)(1), unless the authorization is terminated or revoked sooner. Performed at [facility], [place] [address] [place], [place] [address]   Respiratory Panel by PCR     Status: None   Collection Time: 10/31/19 12:02 AM   Specimen: Nasopharyngeal Swab; Respiratory  Result Value Ref Range Status   Adenovirus NOT DETECTED NOT DETECTED Final   Coronavirus 229E NOT DETECTED NOT DETECTED Final    Comment: (NOTE) The Coronavirus on the Respiratory Panel, DOES NOT test for the novel  Coronavirus (2019 nCoV)    Coronavirus HKU1 NOT DETECTED NOT DETECTED Final   Coronavirus NL63 NOT DETECTED NOT DETECTED Final   Coronavirus OC43 NOT DETECTED NOT DETECTED Final   Metapneumovirus NOT DETECTED NOT DETECTED Final   Rhinovirus / Enterovirus NOT DETECTED NOT DETECTED Final   Influenza A NOT DETECTED NOT DETECTED Final   Influenza B NOT DETECTED NOT DETECTED Final   Parainfluenza Virus 1 NOT DETECTED NOT DETECTED Final   Parainfluenza Virus 2 NOT DETECTED NOT DETECTED Final   Parainfluenza Virus 3 NOT DETECTED NOT DETECTED Final   Parainfluenza Virus 4 NOT DETECTED NOT DETECTED Final   Respiratory Syncytial Virus NOT DETECTED NOT DETECTED Final   Bordetella pertussis NOT DETECTED NOT DETECTED Final   Chlamydophila pneumoniae NOT DETECTED NOT DETECTED Final   Mycoplasma pneumoniae NOT DETECTED NOT DETECTED Final    Comment: Performed at [facility], [place]. [address] [place], [place] [address]     Labs: BNP (last 3 results) Recent Labs    10/01/19 0113 10/26/19 1610 10/30/19 1635  BNP 88.5 108.0* 24.5   Basic Metabolic Panel: Recent Labs  Lab 10/30/19 1635 10/31/19 0402 11/01/19 1215 11/02/19 0420 11/03/19 0429  NA 141  --  140 144 144  K 3.4*  --  3.5 3.6 3.6  CL 98  --  100 100 101  CO2 33*  --  32 34* 33*  GLUCOSE 128*  --  265* 133* 126*  BUN 11  --  13 13 24*   CREATININE 0.69  --  0.65 0.59 0.68  CALCIUM 8.9  --  8.5* 9.2 9.1  MG  --  1.6*  --   --   --   PHOS  --  4.4  --   --   --    Liver Function Tests: Recent Labs  Lab 10/30/19 1635 11/01/19 1215  AST 32 25  ALT 28 26  ALKPHOS 93 76  BILITOT 0.6 0.5  PROT 7.0 7.0  ALBUMIN 3.3* 3.2*   No results for input(s): LIPASE, AMYLASE in the last 168 hours. No results for input(s): AMMONIA in the last 168 hours. CBC: Recent Labs  Lab 10/30/19 1635 10/31/19 0402 11/01/19 1215  WBC 9.8 12.1* 13.8*  NEUTROABS 7.9* 10.3*  --   HGB 11.8* 11.5* 11.6*  HCT 37.9 38.0 37.7  MCV 94.0 93.6 94.0  PLT 171 178 284   Cardiac Enzymes: No results for input(s): CKTOTAL, CKMB, CKMBINDEX, TROPONINI in the last 168 hours. BNP: Invalid input(s): POCBNP CBG: Recent Labs  Lab 11/02/19 2057 11/02/19 2355 11/03/19 0413 11/03/19  0754 11/03/19 1139  GLUCAP 164* 163* 111* 102* 288*   D-Dimer No results for input(s): DDIMER in the last 72 hours. Hgb A1c No results for input(s): HGBA1C in the last 72 hours. Lipid Profile No results for input(s): CHOL, HDL, LDLCALC, TRIG, CHOLHDL, LDLDIRECT in the last 72 hours. Thyroid function studies No results for input(s): TSH, T4TOTAL, T3FREE, THYROIDAB in the last 72 hours.  Invalid input(s): FREET3 Anemia work up No results for input(s): VITAMINB12, FOLATE, FERRITIN, TIBC, IRON, RETICCTPCT in the last 72 hours. Urinalysis    Component Value Date/Time   COLORURINE STRAW (A) 10/30/2019 1635   APPEARANCEUR CLEAR 10/30/2019 1635   LABSPEC 1.006 10/30/2019 1635   PHURINE 5.0 10/30/2019 1635   GLUCOSEU NEGATIVE 10/30/2019 1635   GLUCOSEU NEGATIVE 01/05/2013 1535   HGBUR NEGATIVE 10/30/2019 1635   BILIRUBINUR NEGATIVE 10/30/2019 1635   BILIRUBINUR n 10/27/2014 1118   KETONESUR NEGATIVE 10/30/2019 1635   PROTEINUR NEGATIVE 10/30/2019 1635   UROBILINOGEN 0.2 10/27/2014 1118   UROBILINOGEN 1.0 12/23/2013 1344   NITRITE NEGATIVE 10/30/2019 1635    LEUKOCYTESUR SMALL (A) 10/30/2019 1635   Sepsis Labs Invalid input(s): PROCALCITONIN,  WBC,  LACTICIDVEN Microbiology Recent Results (from the past 240 hour(s))  Novel Coronavirus, NAA ([facility])     Status: None   Collection Time: 10/27/19 12:00 AM   Specimen: Nasopharyngeal(NP) swabs in vial transport medium   NASOPHARYNGE  TESTING  Result Value Ref Range Status   SARS-CoV-2, NAA Not Detected Not Detected Final    Comment: This nucleic acid amplification test was developed and its performance characteristics determined by [employer]. Nucleic acid amplification tests include PCR and TMA. This test has not been FDA cleared or approved. This test has been authorized by FDA under an Emergency Use Authorization (EUA). This test is only authorized for the duration of time the declaration that circumstances exist justifying the authorization of the emergency use of in vitro diagnostic tests for detection of SARS-CoV-2 virus and/or diagnosis of COVID-19 infection under section 564(b)(1) of the Act, 21 U.S.C. 295FMB-3(U) (1), unless the authorization is terminated or revoked sooner. When diagnostic testing is negative, the possibility of a false negative result should be considered in the context of a patient's recent exposures and the presence of clinical signs and symptoms consistent with COVID-19. An individual without symptoms of COVID-19 and who is not shedding SARS-CoV-2 virus would  expect to have a negative (not detected) result in this assay.   Blood Culture (routine x 2)     Status: None (Preliminary result)   Collection Time: 10/30/19  4:35 PM   Specimen: BLOOD LEFT ARM  Result Value Ref Range Status   Specimen Description   Final    BLOOD LEFT ARM Performed at [facility] [address] [place], [place] [address]    Special Requests   Final    BOTTLES DRAWN AEROBIC AND ANAEROBIC Blood Culture results may not be optimal due to an inadequate  volume of blood received in culture bottles Performed at [facility] [address] [place], [place] [address]    Culture   Final    NO GROWTH 4 DAYS Performed at [facility], [place] [address], [place],  [address]    Report Status PENDING  Incomplete  Blood Culture (routine x 2)     Status: None (Preliminary result)   Collection Time: 10/30/19  4:35 PM   Specimen: BLOOD LEFT HAND  Result Value Ref Range Status  Specimen Description   Final    BLOOD LEFT HAND Performed at [facility] [address], [place], [place] [address]    Special Requests   Final    BOTTLES DRAWN AEROBIC AND ANAEROBIC Blood Culture results may not be optimal due to an inadequate volume of blood received in culture bottles Performed at [facility] [address] [place], [place] [address]    Culture   Final    NO GROWTH 4 DAYS Performed at [facility], [place] [address] [place], [place] [address]    Report Status PENDING  Incomplete  Urine culture     Status: Abnormal   Collection Time: 10/30/19  4:35 PM   Specimen: In/Out Cath Urine  Result Value Ref Range Status   Specimen Description   Final    IN/OUT CATH URINE Performed at [facility] [address] [place], [place] [address]    Special Requests   Final    NONE Performed at [facility], [place] [address], [place], [place] [address]    Culture (A)  Final    <10,000 COLONIES/mL INSIGNIFICANT GROWTH Performed at [facility] [address], [place], [place] [address]    Report Status 11/01/2019 FINAL  Final  SARS CORONAVIRUS 2 (TAT 6-24 HRS) Nasopharyngeal Nasopharyngeal Swab     Status: None   Collection Time: 10/30/19  9:03 PM   Specimen: Nasopharyngeal Swab  Result Value Ref Range Status   SARS Coronavirus 2 NEGATIVE NEGATIVE Final    Comment: (NOTE) SARS-CoV-2 target nucleic acids are NOT DETECTED. The SARS-CoV-2  RNA is generally detectable in upper and lower respiratory specimens during the acute phase of infection. Negative results do not preclude SARS-CoV-2 infection, do not rule out co-infections with other pathogens, and should not be used as the sole basis for treatment or other patient management decisions. Negative results must be combined with clinical observations, patient history, and epidemiological information. The expected result is Negative. Fact Sheet for Patients: SugarRoll.be Fact Sheet for Healthcare Providers: https://www.woods-mathews.com/ This test is not yet approved or cleared by the [place] FDA and  has been authorized for detection and/or diagnosis of SARS-CoV-2 by FDA under an Emergency Use Authorization (EUA). This EUA will remain  in effect (meaning this test can be used) for the duration of the COVID-19 declaration under Section 56 4(b)(1) of the Act, 21 U.S.C. section 360bbb-3(b)(1), unless the authorization is terminated or revoked sooner. Performed at [facility], [place] [address] [place], [place] [address]   Respiratory Panel by PCR     Status: None   Collection Time: 10/31/19 12:02 AM   Specimen: Nasopharyngeal Swab; Respiratory  Result Value Ref Range Status   Adenovirus NOT DETECTED NOT DETECTED Final   Coronavirus 229E NOT DETECTED NOT DETECTED Final    Comment: (NOTE) The Coronavirus on the Respiratory Panel, DOES NOT test for the novel  Coronavirus (2019 nCoV)    Coronavirus HKU1 NOT DETECTED NOT DETECTED Final   Coronavirus NL63 NOT DETECTED NOT DETECTED Final   Coronavirus OC43 NOT DETECTED NOT DETECTED Final   Metapneumovirus NOT DETECTED NOT DETECTED Final   Rhinovirus / Enterovirus NOT DETECTED NOT DETECTED Final   Influenza A NOT DETECTED NOT DETECTED Final   Influenza B NOT DETECTED NOT DETECTED Final   Parainfluenza Virus 1 NOT DETECTED NOT DETECTED Final   Parainfluenza Virus 2 NOT  DETECTED NOT DETECTED Final   Parainfluenza Virus 3 NOT  DETECTED NOT DETECTED Final   Parainfluenza Virus 4 NOT DETECTED NOT DETECTED Final   Respiratory Syncytial Virus NOT DETECTED NOT DETECTED Final   Bordetella pertussis NOT DETECTED NOT DETECTED Final   Chlamydophila pneumoniae NOT DETECTED NOT DETECTED Final   Mycoplasma pneumoniae NOT DETECTED NOT DETECTED Final    Comment: Performed at [facility], [place] [address], [place], [place] [address]     Time coordinating discharge in minutes: 57  SIGNED:   Debbe Odea, MD  [facility] 11/03/2019, 1:36 PM Pager   If 7PM-7AM, please contact night-coverage [email or web address] Password [email or web address]

## 2019-11-03 NOTE — TOC Progression Note (Addendum)
Transition of Care [facility]) - Progression Note    Patient Details  Name: TANESHA VULLO MRN: [id number] Date of Birth: 1956-06-04  Transition of Care [facility]) CM/SW Contact  Purcell Mouton, RN Phone Number: 11/03/2019, 3:20 PM  Clinical Narrative: Spoke with pt concerning Home O2. Pt is with [employer]. Pt was asking for a small, shoulder tank. Explained to pt that she will need to follow up with MD and that most DME agencies do not carry that type of O2. Zach with [employer] called for home O2. Pt will follow up with pulmonary MD in two weeks for COPD.         Expected Discharge Plan and Services           Expected Discharge Date: 11/03/19                                     Social Determinants of Health (SDOH) Interventions    Readmission Risk Interventions Readmission Risk Prevention Plan 10/02/2019  Transportation Screening Complete  PCP or Specialist Appt within 5-7 Days Not Complete  Not Complete comments no d/c yet-patient able to make own pcp appt  Home Care Screening Complete  Medication Review (RN CM) Complete  Some recent data might be hidden

## 2019-11-03 NOTE — Progress Notes (Signed)
   NAME:  Shelby Anderson, MRN:  [id number], DOB:  08-13-1956, LOS: 4 ADMISSION DATE:  10/30/2019, CONSULTATION DATE:  11/29 REFERRING MD:  Wynelle Cleveland, CHIEF COMPLAINT:  Recurrent respiratory failure    Brief History   63 year old white female with current history of breast cancer undergoing chemotherapy (Cytoxan and docetaxel) just discharged November first after being treated for what was felt to be decompensated diastolic heart failure.  Readmitted 11/27 with acute hypoxic respiratory failure in the setting of diffuse bilateral groundglass pulmonary infiltrates  Past Medical History  Persistent asthma (FEV1 49% predicted), obstructive sleep apnea, atrial fibrillation, breast cancer diagnosed 2020 currently on Cytoxan and docetaxel, last therapy approximately 3 weeks ago she is completed 2 of 6 cycles.   Significant Hospital Events   11/27 admitted.  Started on IV Lasix, supplemental oxygen Intact antibiotics, and pulse steroids., Consults:  Pulmonary consulted  Procedures:    Significant Diagnostic Tests:  CT chest 11/27: Scattered groundglass pulmonary opacities throughout both lungs with subsegmental atelectasis negative for PE  10/29 echocardiogram:.  LVEF 60 to 65% no left ventricular hypertrophy, RV size was mildly enlarged  Micro Data:  Respiratory viral panel was -11/28 Blood cultures x211/27 - Urine culture 11/27: Insignificant growth COVID-19 11/27 - Antimicrobials:  azithromycin 11/27>>>   Interim history/subjective:  Feeling better but desaturating to 80s w/ ambulation  Objective   Blood pressure 117/72, pulse (Abnormal) 57, temperature 97.8 F (36.6 C), temperature source Oral, resp. rate 18, height 5\' 6"  (1.676 m), weight 100.8 kg, SpO2 90 %.        Intake/Output Summary (Last 24 hours) at 11/03/2019 1109 Last data filed at 11/02/2019 2200 Gross per 24 hour  Intake 600 ml  Output 1800 ml  Net -1200 ml   Filed Weights   10/31/19 0242  Weight: 100.8 kg     Examination: General pleasant 63 year old white female resting in bed awaiting to start ambulation HENT NCAT no JVD pulm faint crackles in bases no accessory use  Card Regular irreg abd not tender + bowel sounds Ext warm and dry trace LE edema Neuro intact   Resolved Hospital Problem list     Assessment & Plan:   Acute on chronic hypoxic respiratory failure Diffuse groundglass pulmonary infiltrates Moderate persistent asthma Breast cancer Obstructive sleep apnea Severe GERD Morbid obesity Chronic back pain Diabetes   Pulmonary problem list: Acute on chronic hypoxic respiratory failure in the setting of diffuse groundglass pulmonary infiltrates  -Etiology unclear.  Very concerned about possibility of drug related pneumonitis in the setting of her chemotherapy regimen. There may be a component of volume here but do not think that this is the primary issue.   Plan Cont lasix as able Complete 5d azith Wean oxygen as able. Will need to assess walking oximetry prior to dc Will need slow pred taper decrease by 10mg /d every 5 days.  Will make sure she has followup.  Have asked onc to see. They may need to re-evaluate her chemotherapy regimen.    Chronic fixed asthma -I do not think this is an asthmatic exacerbation Plan Cont home BDs  OSA Plan Cont CPAP   Severe GERD Plan Cont gerd rx     Erick Colace ACNP-BC [place] Pager # [phone] OR # [phone] if no answer

## 2019-11-03 NOTE — Progress Notes (Addendum)
HEMATOLOGY-ONCOLOGY PROGRESS NOTE  SUBJECTIVE: Presented to the ER with shortness of breath with wheezing X 1 week. O2 sats were down to 70% on room air at home. In the ER, she was noted to have increased work of breathing with O2 sats down to 87%. CT scan showed scattered groundglass opacities, no PE. Was started on steroids and azithromycin. Has been seen by PCCM who is concerned about drug related pneumonitis.   She reports that her breathing has improved.  However, when ambulating she desats to the lower 80s without oxygen.  She denies chest pain, abdominal pain, nausea, vomiting.  She developed a rash after her last cycle of chemotherapy on her arms which is resolving.  Oncology History  Malignant neoplasm of upper-outer quadrant of right breast in female, estrogen receptor negative ([place])  09/07/2019 Initial Diagnosis   Routine screening mammogram detected a 2.1cm mass in the left breast and no left axillary adenopathy. 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   09/24/2019 -  Chemotherapy   The patient had palonosetron (ALOXI) injection 0.25 mg, 0.25 mg, Intravenous,  Once, 2 of 6 cycles Administration: 0.25 mg (09/24/2019), 0.25 mg (10/15/2019) pegfilgrastim (NEULASTA ONPRO KIT) injection 6 mg, 6 mg, Subcutaneous, Once, 2 of 6 cycles Administration: 6 mg (09/24/2019), 6 mg (10/15/2019) cyclophosphamide (CYTOXAN) 1,100 mg in sodium chloride 0.9 % 250 mL chemo infusion, 500 mg/m2 = 1,100 mg (83.3 % of original dose 600 mg/m2), Intravenous,  Once, 2 of 6 cycles Dose modification: 500 mg/m2 (original dose 600 mg/m2, Cycle 1, Reason: Provider Judgment) Administration: 1,100 mg (09/24/2019), 1,100 mg (10/15/2019) DOCEtaxel (TAXOTERE) 140 mg in sodium chloride 0.9 % 250 mL chemo infusion, 65 mg/m2 = 140 mg (86.7 % of original dose 75 mg/m2), Intravenous,  Once, 2 of 6 cycles Dose modification: 65 mg/m2 (original dose 75 mg/m2, Cycle 1, Reason: Provider  Judgment) Administration: 140 mg (09/24/2019), 140 mg (10/15/2019) fosaprepitant (EMEND) 150 mg in sodium chloride 0.9 % 145 mL IVPB, , Intravenous,  Once, 2 of 6 cycles Administration:  (09/24/2019),  (10/15/2019)  for chemotherapy treatment.       REVIEW OF SYSTEMS:   Constitutional: Denies fevers and chills Ears, nose, mouth, throat, and face: Denies mucositis or sore throat Respiratory: Shortness of breath improving Cardiovascular: Denies palpitation, chest discomfort Gastrointestinal:  Denies nausea, heartburn or change in bowel habits Skin: Denies abnormal skin rashes Lymphatics: Denies new lymphadenopathy or easy bruising Neurological:Denies numbness, tingling or new weaknesses Behavioral/Psych: Mood is stable, no new changes  Extremities: No lower extremity edema All other systems were reviewed with the patient and are negative.  I have reviewed the past medical history, past surgical history, social history and family history with the patient and they are unchanged from previous note.   PHYSICAL EXAMINATION: ECOG PERFORMANCE STATUS: 1 - Symptomatic but completely ambulatory  Vitals:   11/03/19 0416 11/03/19 0843  BP: 117/72   Pulse: (!) 57   Resp: 18   Temp: 97.8 F (36.6 C)   SpO2: 98% 90%   Filed Weights   10/31/19 0242  Weight: 222 lb 3.2 oz (100.8 kg)    Intake/Output from previous day: 11/30 0701 - 12/01 0700 In: 640 [P.O.:480; I.V.:160] Out: 2100 [Urine:2100]  GENERAL:alert, no distress and comfortable SKIN: Faint rash to arms LUNGS: Faint rales to the lung bases HEART: regular rate & rhythm and no murmurs and no lower extremity edema ABDOMEN:abdomen soft, non-tender and normal bowel sounds Musculoskeletal:no cyanosis of digits and  no clubbing  NEURO: alert & oriented x 3 with fluent speech, no focal motor/sensory deficits  LABORATORY DATA:  I have reviewed the data as listed CMP Latest Ref Rng & Units 11/03/2019 11/02/2019 11/01/2019  Glucose  70 - 99 mg/dL 126(H) 133(H) 265(H)  BUN 8 - 23 mg/dL 24(H) 13 13  Creatinine 0.44 - 1.00 mg/dL 0.68 0.59 0.65  Sodium 135 - 145 mmol/L 144 144 140  Potassium 3.5 - 5.1 mmol/L 3.6 3.6 3.5  Chloride 98 - 111 mmol/L 101 100 100  CO2 22 - 32 mmol/L 33(H) 34(H) 32  Calcium 8.9 - 10.3 mg/dL 9.1 9.2 8.5(L)  Total Protein 6.5 - 8.1 g/dL - - 7.0  Total Bilirubin 0.3 - 1.2 mg/dL - - 0.5  Alkaline Phos 38 - 126 U/L - - 76  AST 15 - 41 U/L - - 25  ALT 0 - 44 U/L - - 26    Lab Results  Component Value Date   WBC 13.8 (H) 11/01/2019   HGB 11.6 (L) 11/01/2019   HCT 37.7 11/01/2019   MCV 94.0 11/01/2019   PLT 284 11/01/2019   NEUTROABS 10.3 (H) 10/31/2019    Dg Chest 2 View  Result Date: 11/02/2019 CLINICAL DATA:  Shortness of breath, hypoxia EXAM: CHEST - 2 VIEW COMPARISON:  October 30, 2019 FINDINGS: Right chest wall port catheter tip is unchanged. No new consolidation or edema. Opacities on CT are not well seen. No pleural effusion. No pneumothorax. Stable cardiomediastinal contours. IMPRESSION: No new findings. Electronically Signed   By: Macy Mis M.D.   On: 11/02/2019 14:48   Dg Chest 2 View  Result Date: 10/26/2019 CLINICAL DATA:  Shortness of breath EXAM: CHEST - 2 VIEW COMPARISON:  10/03/2019 FINDINGS: There is a well-positioned right-sided Port-A-Cath. There is a loop recorder in place. There is no pneumothorax. No large pleural effusion. There is atelectasis involving the lung bases bilaterally. There may be trace bilateral pleural effusions. There is no acute osseous abnormality. IMPRESSION: 1. Bibasilar atelectasis. 2. Possible trace bilateral pleural effusions. 3. Well-positioned right-sided Port-A-Cath. Electronically Signed   By: Constance Holster M.D.   On: 10/26/2019 16:09   Ct Angio Chest Pe W And/or Wo Contrast  Result Date: 10/30/2019 CLINICAL DATA:  Shortness of breath EXAM: CT ANGIOGRAPHY CHEST WITH CONTRAST TECHNIQUE: Multidetector CT imaging of the chest was  performed using the standard protocol during bolus administration of intravenous contrast. Multiplanar CT image reconstructions and MIPs were obtained to evaluate the vascular anatomy. CONTRAST:  136m OMNIPAQUE IOHEXOL 350 MG/ML SOLN COMPARISON:  Chest x-ray October 30, 2019. CTA of the chest October 01, 2019. FINDINGS: Cardiovascular: The thoracic aorta demonstrates no aneurysm or dissection. Coronary artery calcifications are identified involving the LAD. Heart is stable. Evaluation of pulmonary arteries beyond the segmental branches is limited due to respiratory motion. Within this limitation, no pulmonary emboli identified. Mediastinum/Nodes: No enlarged mediastinal, hilar, or axillary lymph nodes. Thyroid gland, trachea, and esophagus demonstrate no significant findings. Lungs/Pleura: Central airways. No pneumothorax. Patchy pulmonary opacities are identified, primarily ground-glass in nature. No consolidation identified. No nodules masses. Upper Abdomen: No acute abnormality. Musculoskeletal: No chest wall abnormality. No acute or significant osseous findings. Review of the MIP images confirms the above findings. IMPRESSION: 1. Scattered ground-glass opacities throughout the lungs may represent mild edema, scattered subsegmental atelectasis, or atypical infection. No consolidation. 2. No central pulmonary emboli identified. Evaluation beyond the central pulmonary arteries is somewhat limited due to motion but no emboli are noted. 3. Coronary  artery calcifications involving the LAD. 4. No other abnormalities. Aortic Atherosclerosis (ICD10-I70.0). Electronically Signed   By: Dorise Bullion III M.D   On: 10/30/2019 21:54   Dg Chest Port 1 View  Result Date: 10/30/2019 CLINICAL DATA:  Shortness of breath EXAM: PORTABLE CHEST 1 VIEW COMPARISON:  10/26/2019 FINDINGS: Cardiomegaly. Implantable loop recorder. Right chest port catheter. Both lungs are clear. The visualized skeletal structures are unremarkable.  IMPRESSION: Cardiomegaly without acute abnormality of the lungs in AP portable projection. Electronically Signed   By: Eddie Candle M.D.   On: 10/30/2019 16:40   Vas [place] Lower Extremity Venous (dvt)  Result Date: 11/01/2019  Lower Venous Study Indications: Swelling.  Limitations: Poor ultrasound/tissue interface and body habitus. Comparison Study: No prior study. Performing Technologist: Maudry Mayhew MHA, RDMS, RVT, RDCS  Examination Guidelines: A complete evaluation includes B-mode imaging, spectral Doppler, color Doppler, and power Doppler as needed of all accessible portions of each vessel. Bilateral testing is considered an integral part of a complete examination. Limited examinations for reoccurring indications may be performed as noted.  +-----+---------------+---------+-----------+----------+--------------+ RIGHTCompressibilityPhasicitySpontaneityPropertiesThrombus Aging +-----+---------------+---------+-----------+----------+--------------+ CFV  Full           Yes      Yes                                 +-----+---------------+---------+-----------+----------+--------------+   +---------+---------------+---------+-----------+----------+--------------+ LEFT     CompressibilityPhasicitySpontaneityPropertiesThrombus Aging +---------+---------------+---------+-----------+----------+--------------+ CFV      Full           Yes      Yes                                 +---------+---------------+---------+-----------+----------+--------------+ SFJ      Full                                                        +---------+---------------+---------+-----------+----------+--------------+ FV Prox  Full                                                        +---------+---------------+---------+-----------+----------+--------------+ FV Mid   Full                                                         +---------+---------------+---------+-----------+----------+--------------+ FV DistalFull                                                        +---------+---------------+---------+-----------+----------+--------------+ PFV      Full                                                        +---------+---------------+---------+-----------+----------+--------------+  POP      Full           Yes      Yes                                 +---------+---------------+---------+-----------+----------+--------------+ PTV      Full                                                        +---------+---------------+---------+-----------+----------+--------------+ PERO     Full                                                        +---------+---------------+---------+-----------+----------+--------------+     Summary: Right: No evidence of common femoral vein obstruction. Left: There is no evidence of deep vein thrombosis in the lower extremity. No cystic structure found in the popliteal fossa.  *See table(s) above for measurements and observations. Electronically signed by Curt Jews MD on 11/01/2019 at 7:47:47 AM.    Final     ASSESSMENT AND PLAN: 1.  Right breast cancer, ER/PR negative 2.  Acute respiratory failure with hypoxia 3.  Mild leukocytosis secondary to steroids 4.  Obstructive sleep apnea 5.  Type 2 diabetes  -Discussed findings from PCCM with the patient.  Unclear if current chemotherapy is causing drug-related pneumonitis.  However, she has been admitted following both cycles of chemotherapy.  Discussed with the patient that we may consider stopping her chemotherapy at this point and proceeding with surgery.  Final recommendations will be discussed further by Dr. Lindi Adie. -Discussed with patient that she will likely discharge home with oxygen.  She has outpatient follow-up with pulmonology in approximately 2 weeks.  She was advised to keep that appointment.   LOS: 4 days    Mikey Bussing, DNP, AGPCNP-BC, AOCNP 11/03/19  Attending Note  I personally saw the patient, reviewed the chart and examined the patient.   I agree with the assessment and plan as documented above.   Triple negative breast cancer: On neoadjuvant chemotherapy with Taxotere and Cytoxan.  Patient is unable to tolerate chemotherapy. Even though it is unclear if Taxotere was the cause of her respiratory issues, given the frequency of these episodes after each chemotherapy, we decided that we need to stop further chemotherapy. Once she is medically stable she could be evaluated for surgery.

## 2019-11-03 NOTE — Progress Notes (Signed)
Received report from [place], planned care continues. Pt stable, denies pain, resting in bed. Will cont to monitor. SRP RN

## 2019-11-03 NOTE — Progress Notes (Signed)
Physical Therapy Treatment Patient Details Name: Shelby Anderson MRN: [id number] DOB: 01/29/1956 Today's Date: 11/03/2019    History of Present Illness 63 year old female was admitted for SOB.  She was recently here for hypoxia and CHF. PMH:  COPD, asthma, breast CA, on chemo, DM, chronic back pain, A fib and GERD    PT Comments    Pt demonstrating increased gait without rest breaks and decreased assist for transfers.  She does require 2 LPM O2 with activity for stable O2 sats.  Reports hx of fall, but no episodes of knee buckling with PT.  Discussed and performed HEP for quad and LE strengthening.  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   Follow Up Recommendations  No PT follow up     Equipment Recommendations  None recommended by PT    Recommendations for Other Services       Precautions / Restrictions Precautions Precautions: Fall    Mobility  Bed Mobility Overal bed mobility: Modified Independent             General bed mobility comments: EOB on arrival  Transfers Overall transfer level: Needs assistance Equipment used: None Transfers: Sit to/from Stand Sit to Stand: Supervision            Ambulation/Gait Ambulation/Gait assistance: Min guard Gait Distance (Feet): 450 Feet Assistive device: IV Pole Gait Pattern/deviations: Decreased stride length;Step-through pattern     General Gait Details: mild instability but no LOB; on 2 LPM O2 with sats 93% (tried RA earlier with nursing but sats dropped)   Stairs             Wheelchair Mobility    Modified Rankin (Stroke Patients Only)       Balance Overall balance assessment: Needs assistance Sitting-balance support: No upper extremity supported;Feet supported Sitting balance-Leahy Scale: Normal     Standing balance support: No upper extremity supported Standing balance-Leahy Scale: Fair                              Cognition Arousal/Alertness: Awake/alert Behavior  During Therapy: WFL for tasks assessed/performed Overall Cognitive Status: Within Functional Limits for tasks assessed                                        Exercises General Exercises - Lower Extremity Quad Sets: AROM;10 reps;Both Long Arc Quad: AROM;10 reps;Both Other Exercises Other Exercises: sit to stand x 10 with SBA    General Comments General comments (skin integrity, edema, etc.): Pt reports falls at home due to knees buckling - typically on unlevel surfaces (uses cane outside)      Pertinent Vitals/Pain Pain Assessment: No/denies pain    Home Living                      Prior Function            PT Goals (current goals can now be found in the care plan section) Progress towards PT goals: Progressing toward goals    Frequency    Min 3X/week      PT Plan Current plan remains appropriate    Co-evaluation              AM-PAC PT "6 Clicks" Mobility   Outcome Measure  Help needed turning from your back to your side while  in a flat bed without using bedrails?: None Help needed moving from lying on your back to sitting on the side of a flat bed without using bedrails?: None Help needed moving to and from a bed to a chair (including a wheelchair)?: None Help needed standing up from a chair using your arms (e.g., wheelchair or bedside chair)?: None Help needed to walk in hospital room?: None Help needed climbing 3-5 steps with a railing? : A Little 6 Click Score: 23    End of Session Equipment Utilized During Treatment: Gait belt;Oxygen Activity Tolerance: Patient tolerated treatment well Patient left: in bed;with call bell/phone within reach(refuses bed alarm) Nurse Communication: Mobility status PT Visit Diagnosis: Difficulty in walking, not elsewhere classified (R26.2);Muscle weakness (generalized) (M62.81);Unsteadiness on feet (R26.81)     Time: [id number] PT Time Calculation (min) (ACUTE ONLY): 20 min  Charges:   $Therapeutic Exercise: 8-22 mins                     Maggie Font, PT Acute Rehab Services Pager [phone] [facility] [phone] [facility] [phone]    Karlton Lemon 11/03/2019, 3:28 PM

## 2019-11-03 NOTE — Progress Notes (Signed)
When assessing the patient, I asked her if she has had any falls in the last 6 months and she responded yes. I told her I would need to put her on the bed alarm and she refused.

## 2019-11-03 NOTE — Progress Notes (Signed)
Pt discharged to home instructions reviewed with patients. Acknowledge understanding. SRP,RN

## 2019-11-03 NOTE — Consult Note (Signed)
   [facility] CM Inpatient Consult   11/03/2019  KATHERLEEN IMAMURA June 28, 1956 [id number]   Patient chart has been reviewed for readmissions less than 30 days and for high risk score, 24%, for unplanned readmissions.  Patient assessed for community [place] Management follow up needs. Patient had been engaged with a [place] for chronic disease management.  Spoke with patient by telephone. HIPAA verified. Reviewed [facility] CM services with patient and inquired about continued needs. Patient states that she welcomes [facility] CM follow up with RN when she returns home. States that she may be prescribed oxygen requirement when she goes home. States that this will be a change for her.  Referral for [facility] Care Management RN follow up.  Of note, [facility] Care Management services does not replace or interfere with any services that are arranged by inpatient case management or social work.  Netta Cedars, MSN, [facility] Liaison Nurse Mobile Phone [phone]  Toll free office [phone]

## 2019-11-03 NOTE — Progress Notes (Signed)
SATURATION QUALIFICATIONS: (This note is used to comply with regulatory documentation for home oxygen)  Patient Saturations on Room Air at Rest = 93%  Patient Saturations on Room Air while Ambulating = 82%  Patient Saturations on 2 Liters of oxygen while Ambulating = 95%  Please briefly explain why patient needs home oxygen:  Pt gets very short of breath when she ambulates without oxygen and gets lightheaded.

## 2019-11-04 ENCOUNTER — Telehealth: Payer: Self-pay | Admitting: Internal Medicine

## 2019-11-04 ENCOUNTER — Other Ambulatory Visit: Payer: Self-pay | Admitting: *Deleted

## 2019-11-04 ENCOUNTER — Encounter: Payer: Self-pay | Admitting: *Deleted

## 2019-11-04 LAB — CULTURE, BLOOD (ROUTINE X 2)
Culture: NO GROWTH
Culture: NO GROWTH

## 2019-11-04 NOTE — Telephone Encounter (Signed)
Pt reported that she was hospitalised and the hosp MD discontinued Nexium and prescribed Pepcid.  Pt stated that she cannot tolerate Pepcid and can only take brand Nexium.  She would like to discuss with nurse.

## 2019-11-04 NOTE — Progress Notes (Signed)
Patient Care Team: Caren Macadam, MD as PCP - General (Family Medicine) Rigoberto Noel, MD as Consulting Physician (Pulmonary Disease) Adrian Prows, MD as Consulting Physician (Cardiology) Lenon Oms, MD as Referring Physician (Obstetrics and Gynecology) Veneda Melter, MD as Referring Physician (Cardiology) [name], MD as Consulting Physician (Anesthesiology) Terrance Mass, MD (Inactive) as Consulting Physician (Gynecology) Rockwell Germany, RN as Oncology Nurse Navigator Mauro Kaufmann, RN as Oncology Nurse Navigator Rolm Bookbinder, MD as Consulting Physician (General Surgery) Nicholas Lose, MD as Consulting Physician (Hematology and Oncology) Kyung Rudd, MD as Consulting Physician (Radiation Oncology) Alda Berthold, DO as Consulting Physician (Neurology) Jarome Matin, MD as Consulting Physician (Dermatology) Valente David, RN as [place] Management  DIAGNOSIS:    ICD-10-CM   1. Malignant neoplasm of upper-outer quadrant of right breast in female, estrogen receptor negative ([place])  C50.411 MR BREAST BILATERAL W [place] CAD   Z17.1     SUMMARY OF ONCOLOGIC HISTORY: Oncology History  Malignant neoplasm of upper-outer quadrant of right breast in female, estrogen receptor negative ([place])  09/07/2019 Initial Diagnosis   Routine screening mammogram detected a 2.1cm mass in the left breast and no left axillary adenopathy. 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   09/24/2019 -  Chemotherapy   The patient had palonosetron (ALOXI) injection 0.25 mg, 0.25 mg, Intravenous,  Once, 2 of 6 cycles Administration: 0.25 mg (09/24/2019), 0.25 mg (10/15/2019) pegfilgrastim (NEULASTA ONPRO KIT) injection 6 mg, 6 mg, Subcutaneous, Once, 2 of 6 cycles Administration: 6 mg (09/24/2019), 6 mg (10/15/2019) cyclophosphamide (CYTOXAN) 1,100 mg in sodium chloride 0.9 % 250 mL chemo infusion, 500 mg/m2 = 1,100 mg  (83.3 % of original dose 600 mg/m2), Intravenous,  Once, 2 of 6 cycles Dose modification: 500 mg/m2 (original dose 600 mg/m2, Cycle 1, Reason: Provider Judgment) Administration: 1,100 mg (09/24/2019), 1,100 mg (10/15/2019) DOCEtaxel (TAXOTERE) 140 mg in sodium chloride 0.9 % 250 mL chemo infusion, 65 mg/m2 = 140 mg (86.7 % of original dose 75 mg/m2), Intravenous,  Once, 2 of 6 cycles Dose modification: 65 mg/m2 (original dose 75 mg/m2, Cycle 1, Reason: Provider Judgment) Administration: 140 mg (09/24/2019), 140 mg (10/15/2019) fosaprepitant (EMEND) 150 mg in sodium chloride 0.9 % 145 mL IVPB, , Intravenous,  Once, 2 of 6 cycles Administration:  (09/24/2019),  (10/15/2019)  for chemotherapy treatment.      CHIEF COMPLIANT: Cycle 3 Taxotere and Cytoxan, follow-up of hospitalization  INTERVAL HISTORY: VERNA DESROCHER is a 63 y.o. 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 She is currently receiving neoadjuvant chemotherapy with Taxotere and Cytoxan. She was admitted from 11/27-12/1 for acute respiratory failure secondary to acute bronchitis and COPD exacerbation, after presenting to the ED for SOB x1 week.  We decided to cancel all further chemotherapy because of recurrent respiratory exacerbations with each treatment and she is still not fully improved.  She still requires 24-hour oxygen at home.  REVIEW OF SYSTEMS:   Constitutional: Denies fevers, chills or abnormal weight loss Eyes: Denies blurriness of vision Ears, nose, mouth, throat, and face: Denies mucositis or sore throat Respiratory: Shortness of breath even to minimal exertion, De-satting easily at home, requiring home oxygen. Cardiovascular: Denies palpitation, chest discomfort Gastrointestinal: Denies nausea, heartburn or change in bowel habits Skin: Denies abnormal skin rashes Lymphatics: Denies new lymphadenopathy or easy bruising Neurological: Denies numbness, tingling or new  weaknesses Behavioral/Psych: Mood is stable, no new changes  Extremities: No lower extremity edema Breast: denies any pain or lumps or nodules in either breasts All other systems were reviewed with the patient and are negative.  I have reviewed the past medical history, past surgical history, social history and family history with the patient and they are unchanged from previous note.  ALLERGIES:  is allergic to lodine [etodolac]; oxycontin [oxycodone hcl]; penicillins; aspirin; darvocet [propoxyphene n-acetaminophen]; nitroglycerin; tramadol; ultram [tramadol hcl]; and valium.  MEDICATIONS:  Current Outpatient Medications  Medication Sig Dispense Refill   albuterol (PROVENTIL) (2.5 MG/3ML) 0.083% nebulizer solution Take 3 mLs (2.5 mg total) by nebulization every 6 (six) hours as needed for wheezing or shortness of breath (J45.40). 75 mL 3   albuterol (VENTOLIN HFA) 108 (90 Base) MCG/ACT inhaler Inhale 2 puffs into the lungs every 6 (six) hours as needed for wheezing or shortness of breath. 3 g 3   aspirin EC 81 MG tablet Take 81 mg at bedtime by mouth.      atorvastatin (LIPITOR) 40 MG tablet TAKE 1 TABLET BY MOUTH EVERY DAY (Patient taking differently: Take 40 mg by mouth daily. ) 90 tablet 0   blood glucose meter kit and supplies KIT Dispense based on patient and insurance preference. Use to check sugars up to twice daily. 1 each 0   budesonide-formoterol (SYMBICORT) 80-4.5 MCG/ACT inhaler INHALE 2 PUFFS INTO THE LUNGS EVERY MORNING AND ANOTHER 2 PUFFS 12 HOURS LATER (Patient taking differently: Inhale 2 puffs into the lungs 2 (two) times daily. ) 30.6 Inhaler 1   CINNAMON PO Take 1,000 mg 2 (two) times daily by mouth.     clobetasol cream (TEMOVATE) 0.05 % Apply topically 2 (two) times daily. 30 g 0   doxepin (SINEQUAN) 10 MG capsule Take 20 mg by mouth at bedtime.      esomeprazole (NEXIUM) 40 MG capsule Take 1 capsule (40 mg total) by mouth 2 (two) times daily. 180 capsule 3    famotidine (PEPCID) 20 MG tablet Take 1 tablet (20 mg total) by mouth 2 (two) times daily as needed for heartburn or indigestion. 60 tablet 1   fluticasone (FLONASE) 50 MCG/ACT nasal spray Place 2 sprays at bedtime as needed into both nostrils for allergies or rhinitis.      furosemide (LASIX) 20 MG tablet 1-2 daily as directed. (Patient taking differently: Take 20-40 mg by mouth See admin instructions. Take 12m daily and may take another 270mwhen directed from doctor.) 60 tablet 1   glipiZIDE (GLUCOTROL XL) 5 MG 24 hr tablet TAKE 1 TABLET BY MOUTH EVERY DAY WITH BREAKFAST (Patient taking differently: Take 5 mg by mouth daily with breakfast. ) 90 tablet 3   HYDROcodone-acetaminophen (NORCO) 10-325 MG tablet Take 1 tablet by mouth every 6 (six) hours as needed. (Patient not taking: Reported on 11/04/2019) 6 tablet 0   L-Methylfolate (DEPLIN) 7.5 MG TABS Take 7.5 mg by mouth daily with breakfast.      LORazepam (ATIVAN) 0.5 MG tablet Take 1 tablet (0.5 mg total) by mouth at bedtime as needed for sleep. 30 tablet 0   LYRICA 150 MG capsule Take 150 mg by mouth 3 (three) times daily.   2   metFORMIN (GLUCOPHAGE) 500 MG tablet TAKE 1 TABLET (500 MG TOTAL) BY MOUTH 2 (TWO) TIMES DAILY WITH A MEAL. 180 tablet 1   Misc Natural Products (GLUCOS-CHONDROIT-MSM COMPLEX PO) Take 1 tablet 3 (three) times daily by mouth.      mometasone (ELOCON) 0.1 % cream Apply 1 application topically daily  as needed (irritation).      montelukast (SINGULAIR) 10 MG tablet TAKE 1 TABLET BY MOUTH EVERYDAY AT BEDTIME (Patient taking differently: Take 10 mg by mouth at bedtime. ) 90 tablet 1   Multiple Vitamin (MULITIVITAMIN WITH MINERALS) TABS Take 1 tablet by mouth daily.       nitroGLYCERIN (NITROSTAT) 0.4 MG SL tablet Place 0.4 mg under the tongue every 5 (five) minutes as needed for chest pain.      ondansetron (ZOFRAN) 8 MG tablet Take 1 tablet (8 mg total) by mouth 2 (two) times daily as needed for refractory  nausea / vomiting. Start on day 3 after chemo. 30 tablet 1   Oxycodone HCl 20 MG TABS Take 20 mg every 4 (four) hours as needed by mouth (pain).      OXYGEN Inhale 2 L into the lungs at bedtime as needed (shortness of breath).      potassium chloride (K-DUR) 10 MEQ tablet TAKE 2 TABLETS BY MOUTH EVERY DAY (Patient taking differently: Take 20 mEq by mouth daily. ) 180 tablet 1   predniSONE (DELTASONE) 10 MG tablet Start with 40 mg daily and decrease by 10 mg every 5 days. 50 tablet 0   prochlorperazine (COMPAZINE) 10 MG tablet Take 1 tablet (10 mg total) by mouth every 6 (six) hours as needed (Nausea or vomiting). 30 tablet 1   promethazine (PHENERGAN) 25 MG tablet Take 1 tablet (25 mg total) by mouth every 6 (six) hours as needed for nausea or vomiting. 90 tablet 3   sucralfate (CARAFATE) 1 g tablet Take 1 tablet (1 g total) by mouth 4 (four) times daily -  with meals and at bedtime. Before taking slowly dissolve tablet in 1 Tablespoon of distilled water for about 15 minutes to creat a slurry. 360 tablet 3   tiZANidine (ZANAFLEX) 4 MG tablet Take 4 mg by mouth every 8 (eight) hours as needed for muscle spasms.     vitamin B-12 (CYANOCOBALAMIN) 500 MCG tablet Take 500 mcg by mouth daily.      No current facility-administered medications for this visit.     PHYSICAL EXAMINATION: ECOG PERFORMANCE STATUS: 2 - Symptomatic, <50% confined to bed  Vitals:   11/05/19 1115  BP: 126/62  Pulse: 66  Resp: 18  Temp: 98.9 F (37.2 C)  SpO2: 99%   There were no vitals filed for this visit.  GENERAL: alert, no distress and comfortable SKIN: skin color, texture, turgor are normal, no rashes or significant lesions EYES: normal, Conjunctiva are pink and non-injected, sclera clear OROPHARYNX: no exudate, no erythema and lips, buccal mucosa, and tongue normal  NECK: supple, thyroid normal size, non-tender, without nodularity LYMPH: no palpable lymphadenopathy in the cervical, axillary or  inguinal LUNGS: clear to auscultation and percussion with normal breathing effort HEART: regular rate & rhythm and no murmurs and no lower extremity edema ABDOMEN: abdomen soft, non-tender and normal bowel sounds MUSCULOSKELETAL: no cyanosis of digits and no clubbing  NEURO: alert & oriented x 3 with fluent speech, no focal motor/sensory deficits EXTREMITIES: No lower extremity edema  LABORATORY DATA:  I have reviewed the data as listed CMP Latest Ref Rng & Units 11/03/2019 11/02/2019 11/01/2019  Glucose 70 - 99 mg/dL 126(H) 133(H) 265(H)  BUN 8 - 23 mg/dL 24(H) 13 13  Creatinine 0.44 - 1.00 mg/dL 0.68 0.59 0.65  Sodium 135 - 145 mmol/L 144 144 140  Potassium 3.5 - 5.1 mmol/L 3.6 3.6 3.5  Chloride 98 - 111 mmol/L 101 100  100  CO2 22 - 32 mmol/L 33(H) 34(H) 32  Calcium 8.9 - 10.3 mg/dL 9.1 9.2 8.5(L)  Total Protein 6.5 - 8.1 g/dL - - 7.0  Total Bilirubin 0.3 - 1.2 mg/dL - - 0.5  Alkaline Phos 38 - 126 U/L - - 76  AST 15 - 41 U/L - - 25  ALT 0 - 44 U/L - - 26    Lab Results  Component Value Date   WBC 15.7 (H) 11/05/2019   HGB 11.3 (L) 11/05/2019   HCT 35.9 (L) 11/05/2019   MCV 93.0 11/05/2019   PLT 407 (H) 11/05/2019   NEUTROABS 13.1 (H) 11/05/2019    ASSESSMENT & PLAN:  Malignant neoplasm of upper-outer quadrant of right breast in female, estrogen receptor negative ([place]) 09/07/2019:Routine screening mammogram detected a 2.1cm mass in the left breast and no left axillary adenopathy. 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  Recommendations: 1. Neoadjuvant chemotherapy with Taxotere/Cytoxanx 2starting 09/24/2019 (discontinued because of respiratory failure after each chemotherapy) 2. Breast conserving surgery followed by 3. Adjuvant radiation therapy  --------------------------------------------------------------------------------------------------------------------------------------------- 1.  Hospitalization 41/66/0630-16/0/1093: Diastolic  CHF and asthma exacerbation with hypoxia (prednisone taper and Lasix) 2.  Hospitalization 10/30/2019-11/03/2019: Respiratory failure  We will perform breast MRI and get her to see surgery. We will not be able to resume her chemotherapy so port can be removed during surgery. We may consider adjuvant Xeloda depending on the final pathology result.  Return to clinic after surgery to discuss pathology result.    Orders Placed This Encounter  Procedures   MR BREAST BILATERAL W WO CONTRAST INC CAD    Standing Status:   Future    Standing Expiration Date:   01/05/2021    Order Specific Question:   ** REASON FOR EXAM (FREE TEXT)    Answer:   Post neoadjuvant chemo    Order Specific Question:   If indicated for the ordered procedure, I authorize the administration of contrast media per Radiology protocol    Answer:   Yes    Order Specific Question:   What is the patient's sedation requirement?    Answer:   No Sedation    Order Specific Question:   Does the patient have a pacemaker or implanted devices?    Answer:   No    Order Specific Question:   Radiology Contrast Protocol - do NOT remove file path    Answer:   \[email or web address]    Order Specific Question:   Preferred imaging location?    Answer:   [facility] (table limit-550lbs)   The patient has a good understanding of the overall plan. she agrees with it. she will call with any problems that may develop before the next visit here.  Nicholas Lose, MD 11/05/2019  Julious Oka Dorshimer, am acting as scribe for Dr. Nicholas Lose.  I have reviewed the above documentation for accuracy and completeness, and I agree with the above.

## 2019-11-04 NOTE — Patient Outreach (Signed)
[facility] [facility]) Care Management  11/04/2019  GRINDLE CANTARERO [birth year] [id number]   Referral received from hospital liaison as member was recently discharged after being treated for hypoxia, a complication of her chemotherapy treatments of breast cancer.  Primary MD office will complete transition of care assessment.  Per chart, she also has history of hypertension, diabetes, CHF, OSA, GERD, and hyperlipidemia.  Call placed to member, identity verified.  This care manager introduced self and stated purpose of call. [facility] care management services explained.  Report she is doing better since being home.  Recently diagnosed with breast cancer and was receiving chemotherapy.  Was told that the treatment was making her lungs and heart worse.  Was supposed to have another chemo treatment tomorrow but that has been put on hold as they anticipate it will send her back to the hospital, and she state "It gets worse every time."  The decision to continue current treatment will be decided by her pulmonologist after follow up appointment on 12/15.  She will still attend appointment with oncology on tomorrow but will not have treatment.  If unable to take chemo, they will consider surgery and radiation.  Was in hospital when her cardiology appointment was scheduled but will call to reschedule.    Report she has had a decreased appetite due to treatment but also report with all the medical changes finances are an issue.  Concern for inability to buy food.  She was told during hospital stay that she may be eligible for Medicaid now and is willing to apply.    Denies any urgent concerns at this time.  Will place referral to social worker for community resources and will follow up with member within the next 2 weeks.  Depression screen [facility] 2/9 11/04/2019 07/27/2019 04/09/2019 12/02/2018 05/09/2018  Decreased Interest 2 3 1 1 3   Down, Depressed, Hopeless 1 1 1 2  0  PHQ - 2 Score 3 4 2 3 3   Altered  sleeping 3 3 1  - 3  Tired, decreased energy 3 3 1 2 3   Change in appetite 2 2 3 2 2   Feeling bad or failure about yourself  0 0 0 - 1  Trouble concentrating 0 0 1 - 0  Moving slowly or fidgety/restless 0 0 0 - 0  Suicidal thoughts 0 0 0 - 0  PHQ-9 Score 11 12 8  - 12  Difficult doing work/chores - Somewhat difficult - - Somewhat difficult  Some recent data might be hidden   Fall Risk  11/04/2019 09/14/2019 07/27/2019 12/02/2018 05/09/2018  Falls in the past year? 1 1 1 1  Yes  Number falls in past yr: 1 0 1 1 2  or more  Injury with Fall? 0 0 0 1 No  Comment - - Per patient she fell bruising elbow - bruising  Risk Factor Category  - - - - High Fall Risk  Risk for fall due to : History of fall(s) - History of fall(s);Impaired balance/gait;Impaired mobility - History of fall(s);Impaired balance/gait;Impaired mobility  Follow up - - Falls evaluation completed;Education provided;Falls prevention discussed - Falls evaluation completed;Education provided;Falls prevention discussed  Comment - - - - -   THN CM Care Plan Problem One     Most Recent Value  Care Plan Problem One  Risk for readmission related to complications of cancer treatment as evidenced by recent hospitalization  Role Documenting the Problem One  Care Management [place] for Problem One  Active  [facility] Long Term Goal  Member will not be readmitted to hospital within the next 31 days  THN Long Term Goal Start Date  11/04/19  Interventions for Problem One Long Term Goal  Discharge instructions reviewed with member.  Advised of importance of following plan of care in effort to decrease risk of readmission.  Referral placed to social worker for community resources and support.  THN CM Short Term Goal #1   Member will take medicatiosn as prescribed over the next 4 weeks  THN CM Short Term Goal #1 Start Date  11/04/19  Interventions for Short Term Goal #1  Medications reviewed with member.  Advised to check blood pressure and  follow instructions to restart BP meds if SBP remains greater than 140.  THN CM Short Term Goal #2   Member will be able to verbalize plan for cancer treatment within the next 4 weeks  THN CM Short Term Goal #2 Start Date  11/04/19  Interventions for Short Term Goal #2  Follow up appointments for pulmonology and oncology reviewed with member.  Advised of importance to keep all appointments to establish new treatment plan     Valente David, RN, MSN [place] [phone]

## 2019-11-04 NOTE — Telephone Encounter (Signed)
Would you check with her on this? Seemed like a nurse question!

## 2019-11-04 NOTE — Telephone Encounter (Signed)
Reviewed the d/c summary from the hospital with pt and discussed with her she is to continue taking the nexium 40mg  bid and may take pepcid 20mg  bid as needed. Pt verbalized understanding and thanked this rn for the call.

## 2019-11-05 ENCOUNTER — Other Ambulatory Visit: Payer: Self-pay

## 2019-11-05 ENCOUNTER — Inpatient Hospital Stay: Payer: Medicare Other | Attending: Oncology

## 2019-11-05 ENCOUNTER — Inpatient Hospital Stay (HOSPITAL_BASED_OUTPATIENT_CLINIC_OR_DEPARTMENT_OTHER): Payer: Medicare Other | Admitting: Hematology and Oncology

## 2019-11-05 ENCOUNTER — Other Ambulatory Visit: Payer: Self-pay | Admitting: *Deleted

## 2019-11-05 ENCOUNTER — Inpatient Hospital Stay: Payer: Medicare Other

## 2019-11-05 ENCOUNTER — Encounter: Payer: Self-pay | Admitting: Nutrition

## 2019-11-05 ENCOUNTER — Inpatient Hospital Stay: Payer: Medicare Other | Admitting: Nutrition

## 2019-11-05 ENCOUNTER — Encounter: Payer: Self-pay | Admitting: General Practice

## 2019-11-05 DIAGNOSIS — Z171 Estrogen receptor negative status [ER-]: Secondary | ICD-10-CM

## 2019-11-05 DIAGNOSIS — C50411 Malignant neoplasm of upper-outer quadrant of right female breast: Secondary | ICD-10-CM | POA: Diagnosis not present

## 2019-11-05 DIAGNOSIS — Z95828 Presence of other vascular implants and grafts: Secondary | ICD-10-CM

## 2019-11-05 DIAGNOSIS — Z79899 Other long term (current) drug therapy: Secondary | ICD-10-CM | POA: Insufficient documentation

## 2019-11-05 LAB — CBC WITH DIFFERENTIAL (CANCER CENTER ONLY)
Abs Immature Granulocytes: 0.11 10*3/uL — ABNORMAL HIGH (ref 0.00–0.07)
Basophils Absolute: 0.1 10*3/uL (ref 0.0–0.1)
Basophils Relative: 1 %
Eosinophils Absolute: 0 10*3/uL (ref 0.0–0.5)
Eosinophils Relative: 0 %
HCT: 35.9 % — ABNORMAL LOW (ref 36.0–46.0)
Hemoglobin: 11.3 g/dL — ABNORMAL LOW (ref 12.0–15.0)
Immature Granulocytes: 1 %
Lymphocytes Relative: 9 %
Lymphs Abs: 1.4 10*3/uL (ref 0.7–4.0)
MCH: 29.3 pg (ref 26.0–34.0)
MCHC: 31.5 g/dL (ref 30.0–36.0)
MCV: 93 fL (ref 80.0–100.0)
Monocytes Absolute: 1 10*3/uL (ref 0.1–1.0)
Monocytes Relative: 7 %
Neutro Abs: 13.1 10*3/uL — ABNORMAL HIGH (ref 1.7–7.7)
Neutrophils Relative %: 82 %
Platelet Count: 407 10*3/uL — ABNORMAL HIGH (ref 150–400)
RBC: 3.86 MIL/uL — ABNORMAL LOW (ref 3.87–5.11)
RDW: 15.9 % — ABNORMAL HIGH (ref 11.5–15.5)
WBC Count: 15.7 10*3/uL — ABNORMAL HIGH (ref 4.0–10.5)
nRBC: 0 % (ref 0.0–0.2)

## 2019-11-05 LAB — CMP (CANCER CENTER ONLY)
ALT: 36 U/L (ref 0–44)
AST: 29 U/L (ref 15–41)
Albumin: 3.2 g/dL — ABNORMAL LOW (ref 3.5–5.0)
Alkaline Phosphatase: 77 U/L (ref 38–126)
Anion gap: 8 (ref 5–15)
BUN: 19 mg/dL (ref 8–23)
CO2: 31 mmol/L (ref 22–32)
Calcium: 9.3 mg/dL (ref 8.9–10.3)
Chloride: 106 mmol/L (ref 98–111)
Creatinine: 0.69 mg/dL (ref 0.44–1.00)
GFR, Est AFR Am: >60 mL/min (ref >60)
GFR, Estimated: >60 mL/min (ref >60)
Glucose, Bld: 165 mg/dL — ABNORMAL HIGH (ref 70–99)
Potassium: 3.9 mmol/L (ref 3.5–5.1)
Sodium: 145 mmol/L (ref 135–145)
Total Bilirubin: <0.2 mg/dL — ABNORMAL LOW (ref 0.3–1.2)
Total Protein: 6.7 g/dL (ref 6.5–8.1)

## 2019-11-05 MED ORDER — SODIUM CHLORIDE 0.9% FLUSH
10.0000 mL | INTRAVENOUS | Status: DC | PRN
  Filled 2019-11-05: qty 10

## 2019-11-05 MED ORDER — HEPARIN SOD (PORK) LOCK FLUSH 100 UNIT/ML IV SOLN
500.0000 [IU] | Freq: Once | INTRAVENOUS | Status: AC
  Administered 2019-11-05: 500 [IU] via INTRAVENOUS
  Filled 2019-11-05: qty 5

## 2019-11-05 MED ORDER — SODIUM CHLORIDE 0.9% FLUSH
10.0000 mL | INTRAVENOUS | Status: DC | PRN
  Administered 2019-11-05: 10 mL via INTRAVENOUS
  Filled 2019-11-05: qty 10

## 2019-11-05 NOTE — Progress Notes (Signed)
Noted all chemotherapy cancelled due to respiratory distress. Nutrition follow up cancelled. Patient has RD contact information if needed.

## 2019-11-05 NOTE — Patient Instructions (Signed)

## 2019-11-05 NOTE — Assessment & Plan Note (Signed)
09/07/2019:Routine screening mammogram detected a 2.1cm mass in the left breast and no left axillary adenopathy. 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  Recommendations: 1. Neoadjuvant chemotherapy with Taxotere/Cytoxanx 2starting 09/24/2019 (discontinued because of respiratory failure after each chemotherapy) 2. Breast conserving surgery followed by 3. Adjuvant radiation therapy  --------------------------------------------------------------------------------------------------------------------------------------------- 1.  Hospitalization 99/23/4144-36/0/1658: Diastolic CHF and asthma exacerbation with hypoxia (prednisone taper and Lasix) 2.  Hospitalization 10/30/2019-11/03/2019: Respiratory failure  We will perform breast MRI and get her to see surgery. We will not be able to resume her chemotherapy so port can be removed during surgery. We may consider adjuvant Xeloda depending on the final pathology result.  Return to clinic after surgery to discuss pathology result.

## 2019-11-05 NOTE — Progress Notes (Signed)
[place] Spiritual Care Note  Planned to see Shelby Anderson in infusion today; however, noted she will not receive chemo due to respiratory failure. Plan to f/u by phone for pastoral check-in.   [place], [place], [facility] Pager [phone] Voicemail [phone]

## 2019-11-06 ENCOUNTER — Encounter: Payer: Self-pay | Admitting: General Practice

## 2019-11-06 ENCOUNTER — Encounter: Payer: Self-pay | Admitting: *Deleted

## 2019-11-06 NOTE — Progress Notes (Signed)
[name] Spiritual Care Note  Shelby Anderson for pastoral follow-up support. She used the opportunity to share and process present health concerns, as well as past experiences with illness and death in family members, because those experiences add extra emotional load to her current situation. In particular, she is having trouble getting back to sleep at night due to distress. One tool she plans to try is listening to free audiobooks available through the [employer] and free [employer]. I have also mailed her our handout on self-care apps for many topics. We plan to follow up by phone again next week for further processing and to ensure that she has space in her week to talk with someone outside her immediate family.   [place], [place], [facility] Pager [phone] Voicemail [phone]

## 2019-11-07 ENCOUNTER — Ambulatory Visit: Payer: Medicare Other

## 2019-11-12 DIAGNOSIS — Z79891 Long term (current) use of opiate analgesic: Secondary | ICD-10-CM | POA: Diagnosis not present

## 2019-11-12 DIAGNOSIS — S9422XS Injury of deep peroneal nerve at ankle and foot level, left leg, sequela: Secondary | ICD-10-CM | POA: Diagnosis not present

## 2019-11-12 DIAGNOSIS — M47816 Spondylosis without myelopathy or radiculopathy, lumbar region: Secondary | ICD-10-CM | POA: Diagnosis not present

## 2019-11-12 DIAGNOSIS — G894 Chronic pain syndrome: Secondary | ICD-10-CM | POA: Diagnosis not present

## 2019-11-13 ENCOUNTER — Other Ambulatory Visit: Payer: Self-pay

## 2019-11-13 ENCOUNTER — Other Ambulatory Visit: Payer: Self-pay | Admitting: *Deleted

## 2019-11-13 ENCOUNTER — Encounter: Payer: Self-pay | Admitting: General Practice

## 2019-11-13 NOTE — Patient Outreach (Signed)
[facility]) Care Management  11/13/2019  Shelby Anderson [birth year] [id number]   Social work referral received from [facility], [place], on 11/09/19.  "Recently diagnosed with cancer, would like to apply for Medicaid. Also interested in food resources. Requesting help with DME - shower chair." Unsuccessful outreach to patient today.  Left voicemail message and mailed unsuccessful outreach letter. Will attempt to reach again within four business days.   Ronn Melena, BSW Social Worker [phone]

## 2019-11-13 NOTE — Patient Outreach (Signed)
[facility]) Care Management  11/13/2019  [name] [birth year] [id number]   Member's case discussed with multidisciplinary team due to readmission less than 30 days.  Update provided that member has been having respiratory issues due to complications of her chemo treatment.  She did not receive her last scheduled treatment, has follow up with pulmonology next week and hoping to have a plan for further cancer treatment.  This care manager will follow up with member next week as planned, will update team within the next 2 weeks.  Valente David, [place], MSN [place] [phone]

## 2019-11-13 NOTE — Progress Notes (Signed)
[place] Note  Shelby Anderson by phone for follow-up pastoral call. She was upbeat and appreciative of call, noting that 1) having her surgery scheduled and 2) having her sister do fun activities with the children are both reducing her distress. Provided empathic listening, normalization of feelings, and pastoral reflection. Shelby Anderson welcomes chaplain check-ins for sharing and reflection, so we plan to follow up by phone again next week.   [place], [place], [facility] Pager [phone] Voicemail [phone]

## 2019-11-17 ENCOUNTER — Other Ambulatory Visit: Payer: Self-pay

## 2019-11-17 ENCOUNTER — Ambulatory Visit: Payer: Medicare Other | Admitting: Pulmonary Disease

## 2019-11-17 ENCOUNTER — Encounter: Payer: Self-pay | Admitting: Pulmonary Disease

## 2019-11-17 DIAGNOSIS — G4733 Obstructive sleep apnea (adult) (pediatric): Secondary | ICD-10-CM | POA: Diagnosis not present

## 2019-11-17 DIAGNOSIS — J9611 Chronic respiratory failure with hypoxia: Secondary | ICD-10-CM | POA: Diagnosis not present

## 2019-11-17 DIAGNOSIS — J45991 Cough variant asthma: Secondary | ICD-10-CM

## 2019-11-17 NOTE — Assessment & Plan Note (Signed)
Appears to be related to drug induced pneumonitis which is now resolved. She will complete her course of prednisone She will try to stay off oxygen for longer periods She will be cleared for breast surgery?  Lumpectomy

## 2019-11-17 NOTE — Patient Outreach (Addendum)
[place] [facility]) Care Management  11/17/2019  FERRARI ZEHM 1956-03-12 [id number]   Social work referral received from [facility], [place], on 11/09/19.  "Recently diagnosed with cancer, would like to apply for Medicaid. Also interested in food resources. Requesting help with DME - shower chair." Successful outreach to patient today.  Talked with patient about Medicaid eligibility.  Patient/spouse are over income requirement for Medicaid.  Informed patient that Department of Social Services processes the applications so I cannot say with absolute certainty whether or not she would qualify.  Did inform her, however, that income is main criteria for eligibility.  Patient declined application being mailed to her.  Patient did request that Application for Food and Nutrition Services be mailed.  Per patient, she and spouse applied before but did not qualify.  Due to recent diagnosis which will result in increase of medical bills, patient thinks it is worth applying again.  Will also send her [employer] [place] [employer] which lists all pantries and kitchens in the area. Encouraged patient to contact [facility] about coverage for shower chair.  Did inform her that most Medicare plans do not cover bathroom DME. Patient does have [employer].  Patient was also provided with contact information for [facility] and encouraged to call about possible used equipment.  Will follow up with patient within the next two weeks to ensure receipt of Application for Food and Nutrition Services and food resources.  Ronn Melena, BSW Social Worker [phone]

## 2019-11-17 NOTE — Progress Notes (Signed)
Subjective:    Patient ID: Shelby Anderson, female    DOB: [birth year], 63 y.o.   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  Drug-induced pneumonitis She reports asthma since childhood which was only mild intermittent through adult life requiring prn SABA. 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 and underwent neoadjuvant chemotherapy with Cytoxan and docetaxel She had 2 hospital admissions -initially was treated for diastolic heart failure, CT angiogram was negative  Readmitted 11/27 with acute hypoxic respiratory failure in the setting of diffuse bilateral groundglass pulmonary infiltrates -after extensive work-up, negative respiratory viral panel Covid testing, she was felt to have drug-induced pneumonitis -not clear that Taxotere was causing her symptoms but since she had had recurrent after each round of chemotherapy, it was decided to stop further chemotherapy and proceed directly with surgery  She was discharged on oxygen and tapering dose of prednisone. She is now down to 10 mg of prednisone.  Breathing is improved.  She has stayed on 2 L of oxygen at all times  Chest x-ray from 11/30 was reviewed which does not show any evidence of infiltrates Patient was 98% on room air and stayed around 94% on patient today  Significant tests/ events reviewed  CT chest 11/27: Scattered groundglass pulmonary opacities throughout both lungs with subsegmental atelectasis negative for PE CT angiogram 10/29  Neg , GGO 10/29 echocardiogram:.  LVEF 60 to 65% no left ventricular hypertrophy, RV size was mildly enlarged  Spirometry 12/2011 - no airway obstruction -No reversibility with bronchodilator , mod restriction Spirometry8/2019FVC 1.64 (46%), FEV1 1.34 (49%), ratio 82  (104%)  07/2018 FENO 11  11/2015 ONO >> Started on oxygen 2l/m .  10/2016 CT angio neg PE  CT sinus 01/2017 >> clear  EGD 03/2011 ([facility]) - peptic ulcer disease.  12/2013 barium esophagram - distal esophageal stricture,stricture dilated again 2017,2018([place]) 01/2017 Swallow >>Persistent/recurrent stricture at the GE junction  Home sleep test 07/2012 - showed desatn,no OSA HST 08/2018 AHI 9/h 09/2018 >> CPAP 10 cm, med FF mask  Past Medical History:  Diagnosis Date  . Allergy   . Anemia    "chronic"  . Angina   . Anxiety and depression   . Arthritis   . Asthma   . Atrial fibrillation ([place])    h/o "AF w/frequent PVCs"  . Breast cancer ([place])    Left  . Cancer (HCC)    hx of skin cancer   . CHF (congestive heart failure) ([place])   . Chronic back pain greater than 3 months duration    on chronic narcotics, treated at pain clinic  . Colon polyps    hyperplastic  . Coronary artery disease    Arrythmia, orthostatic hypotension, HLD, HTN; sees Dr. Einar Gip  . Difficult intubation    "TMJ & woke up when they were still cutting on me"  . Dysrhythmia    sees Dr. Einar Gip and a cardiologist at [facility]  . Esophageal stricture   . Family history of melanoma ]  . Family history of pancreatic cancer   . Fatty liver   . Fibroids   . Fibromyalgia    "in my legs"  . GERD (gastroesophageal reflux disease)    hx hiatal hernia, stricture and gastric ulcer  . Headache(784.0)   . Heart  murmur   . Hiatal hernia   . History of loop recorder   . History of migraines    "dx'd when I was in my teens"  . Hyperlipemia   . Hypertension   . Mental disorder   . Mild episode of recurrent major depressive disorder ([place]) 12/06/2015  . Myocardial infarction [facility]) 1980's & 1990;   sees Dr. Einar Gip  . OSA (obstructive sleep apnea)   . Pneumonia   . PONV (postoperative nausea and vomiting)   . Recurrent upper respiratory infection (URI)   . Shortness of breath 11/20/11   "all the time",  sees pulmonlogy, ? asthma  . Stenotic cervical os   . Stomach ulcer    "3 small; found in 05/2011"  . TMJ (dislocation of temporomandibular joint)   . Tuberculosis    + TB SKIN TEST  . Type 2 diabetes mellitus without complication, without long-term current use of insulin ([place]) 12/06/2015   not on meds      Review of Systems neg for any significant sore throat, dysphagia, itching, sneezing, nasal congestion or excess/ purulent secretions, fever, chills, sweats, unintended wt loss, pleuritic or exertional cp, hempoptysis, orthopnea pnd or change in chronic leg swelling. Also denies presyncope, palpitations, heartburn, abdominal pain, nausea, vomiting, diarrhea or change in bowel or urinary habits, dysuria,hematuria, rash, arthralgias, visual complaints, headache, numbness weakness or ataxia.     Objective:   Physical Exam   Gen. Pleasant, obese, in no distress, normal affect ENT - no pallor,icterus, no post nasal drip, class 2-3 airway Neck: No JVD, no thyromegaly, no carotid bruits Lungs: no use of accessory muscles, no dullness to percussion, decreased without rales or rhonchi  Cardiovascular: Rhythm regular, heart sounds  normal, no murmurs or gallops, 1+ peripheral edema Abdomen: soft and non-tender, no hepatosplenomegaly, BS normal. Musculoskeletal: No deformities, no cyanosis or clubbing Neuro:  alert, non focal, no tremors        Assessment & Plan:

## 2019-11-17 NOTE — Assessment & Plan Note (Signed)
Continue on Symbicort Use albuterol as needed

## 2019-11-17 NOTE — Patient Instructions (Signed)
We will clear you for surgery once you are off oxygen  Ambulatory saturation today

## 2019-11-17 NOTE — Assessment & Plan Note (Signed)
She is compliant with CPAP and this is helping. Continue CPAP 10 cm during sleep, emphasized this in the postoperative setting  Weight loss encouraged, compliance with goal of at least 4-6 hrs every night is the expectation. Advised against medications with sedative side effects Cautioned against driving when sleepy - understanding that sleepiness will vary on a day to day basis

## 2019-11-18 ENCOUNTER — Other Ambulatory Visit: Payer: Self-pay | Admitting: *Deleted

## 2019-11-18 NOTE — Patient Outreach (Signed)
[facility]) Care Management  11/18/2019  SHALETTE DEEKEN March 27, 1956 [id number]   Call placed to member to follow up on current health status and appointment with pulmonologist.  Female answering phone state she is asleep, unable to come to the phone.  Request made for member to call this care manager back once she is awake and available.  If no call back, will follow up within the next 3-4 business days.  Valente David, [place], MSN [place] [phone]

## 2019-11-19 ENCOUNTER — Ambulatory Visit
Admission: RE | Admit: 2019-11-19 | Discharge: 2019-11-19 | Disposition: A | Discharge status: Home / Self Care | Payer: Medicare Other | Source: Ambulatory Visit | Attending: Hematology and Oncology | Admitting: Hematology and Oncology

## 2019-11-19 ENCOUNTER — Encounter: Payer: Self-pay | Admitting: General Practice

## 2019-11-19 ENCOUNTER — Other Ambulatory Visit: Payer: Self-pay | Admitting: Hematology and Oncology

## 2019-11-19 ENCOUNTER — Ambulatory Visit: Payer: Medicare Other | Admitting: *Deleted

## 2019-11-19 ENCOUNTER — Other Ambulatory Visit: Payer: Self-pay

## 2019-11-19 DIAGNOSIS — Z171 Estrogen receptor negative status [ER-]: Secondary | ICD-10-CM

## 2019-11-19 DIAGNOSIS — C50412 Malignant neoplasm of upper-outer quadrant of left female breast: Secondary | ICD-10-CM

## 2019-11-19 DIAGNOSIS — N6321 Unspecified lump in the left breast, upper outer quadrant: Secondary | ICD-10-CM | POA: Diagnosis not present

## 2019-11-19 DIAGNOSIS — R928 Other abnormal and inconclusive findings on diagnostic imaging of breast: Secondary | ICD-10-CM | POA: Diagnosis not present

## 2019-11-19 DIAGNOSIS — C50411 Malignant neoplasm of upper-outer quadrant of right female breast: Secondary | ICD-10-CM

## 2019-11-19 NOTE — Progress Notes (Signed)
[place] Spiritual Care Note  LVM of care and encouragement, also encouraging callback.   [place], [place], [facility] Pager [phone] Voicemail [phone]

## 2019-11-20 ENCOUNTER — Other Ambulatory Visit: Payer: Self-pay | Admitting: General Surgery

## 2019-11-20 DIAGNOSIS — C50412 Malignant neoplasm of upper-outer quadrant of left female breast: Secondary | ICD-10-CM

## 2019-11-20 DIAGNOSIS — Z170 Estrogen receptor positive status [ER+]: Secondary | ICD-10-CM

## 2019-11-21 ENCOUNTER — Other Ambulatory Visit: Payer: Self-pay | Admitting: Family Medicine

## 2019-11-23 ENCOUNTER — Other Ambulatory Visit: Payer: Self-pay | Admitting: *Deleted

## 2019-11-23 DIAGNOSIS — J45909 Unspecified asthma, uncomplicated: Secondary | ICD-10-CM | POA: Diagnosis not present

## 2019-11-23 DIAGNOSIS — J189 Pneumonia, unspecified organism: Secondary | ICD-10-CM | POA: Diagnosis not present

## 2019-11-23 DIAGNOSIS — R0902 Hypoxemia: Secondary | ICD-10-CM | POA: Diagnosis not present

## 2019-11-23 DIAGNOSIS — J4540 Moderate persistent asthma, uncomplicated: Secondary | ICD-10-CM | POA: Diagnosis not present

## 2019-11-23 NOTE — Patient Outreach (Signed)
[place] [facility]) Care Management  11/23/2019  Shelby Anderson 1956-08-30 [id number]   Outreach attempt #2, successful.  Call placed to member to follow up on current health status and appointment with pulmonologist.  Member report she is doing much better, denies shortness of breath at this time.  She is only having to use her oxygen when she is active & mobile (when going out of the home), otherwise using CPAP at night without problems.  She continues to monitor oxygen levels daily in effort to remain above 90%  Report per her appointment with pulmonologist and oncologist, decision was made to stop chemo and to have surgery.  State according to mammogram and ultrasound results, tumor has decreased in size.  She has an appointment scheduled with the surgeon on 12/30 to discuss lumpectomy versus mastectomy.  She is not clear on the understanding between the two, open to receiving EMMI education with explanation.    Denies any urgent concerns at this time.  Will assign EMMI education: Breast Cancer Treatment Options and Early Stages Breast Cancer Adjuvant Treatment Options.  Will follow up within the next 2 weeks.  THN CM Care Plan Problem One     Most Recent Value  Care Plan Problem One  Risk for readmission related to complications of cancer treatment as evidenced by recent hospitalization  Role Documenting the Problem One  Care Management [place] for Problem One  Active  THN Long Term Goal   Member will not be readmitted to hospital within the next 31 days  THN Long Term Goal Start Date  11/04/19  Interventions for Problem One Long Term Goal  Reviewed upcoming appointments with member, advised of importance of keeping in effort to manage conditions and develop treatment plan  THN CM Short Term Goal #1   Member will take medicatiosn as prescribed over the next 4 weeks  THN CM Short Term Goal #1 Start Date  11/04/19  [facility] CM Short Term Goal #1 Met Date   11/23/19  [facility] CM Short Term Goal #2   Member will be able to verbalize plan for cancer treatment within the next 4 weeks  THN CM Short Term Goal #2 Start Date  11/04/19  Interventions for Short Term Goal #2  Assigned member EMMI education regarding cancer treatment, focusing on treatment (lumptectomy vs mastectomy).     Valente David, [place], MSN [place] [phone]

## 2019-11-24 ENCOUNTER — Other Ambulatory Visit: Payer: Self-pay | Admitting: General Surgery

## 2019-11-24 ENCOUNTER — Encounter: Payer: Self-pay | Admitting: *Deleted

## 2019-11-24 DIAGNOSIS — C50412 Malignant neoplasm of upper-outer quadrant of left female breast: Secondary | ICD-10-CM

## 2019-11-25 ENCOUNTER — Telehealth: Payer: Self-pay | Admitting: Hematology and Oncology

## 2019-11-25 NOTE — Telephone Encounter (Signed)
Scheduled appt per 12/22 sch message - pt aware of appt date and time

## 2019-11-26 ENCOUNTER — Other Ambulatory Visit: Payer: Self-pay | Admitting: *Deleted

## 2019-11-26 NOTE — Patient Outreach (Signed)
[facility]) Care Management  11/26/2019  Shelby Anderson 06/26/1956 [id number]   Member's case discussed with multidisciplinary team as a follow up from previous discussion.  Advised team of treatment plan for cancer, which is now surgery and possible radiation.  They are also advised that member has been educated on surgery & treatment options.  Will follow up with member within the next 2 weeks as planned.  Valente David, [place], MSN [place] [phone]

## 2019-11-27 ENCOUNTER — Other Ambulatory Visit: Payer: Self-pay | Admitting: Family Medicine

## 2019-11-27 DIAGNOSIS — E119 Type 2 diabetes mellitus without complications: Secondary | ICD-10-CM

## 2019-11-30 ENCOUNTER — Other Ambulatory Visit: Payer: Self-pay | Admitting: *Deleted

## 2019-11-30 DIAGNOSIS — Z171 Estrogen receptor negative status [ER-]: Secondary | ICD-10-CM

## 2019-11-30 DIAGNOSIS — C50411 Malignant neoplasm of upper-outer quadrant of right female breast: Secondary | ICD-10-CM

## 2019-12-01 ENCOUNTER — Other Ambulatory Visit: Payer: Self-pay

## 2019-12-01 NOTE — Patient Outreach (Signed)
[place] [facility]) Care Management  12/01/2019  Shelby Anderson 1956/01/10 [id number]   Successful follow up call to patient today who confirmed receipt of resources mailed on 11/17/19.  Patient denied need for additional assistance at this time.  Closing case but did encourage her to call if additional needs arise.  Ronn Melena, BSW Social Worker [phone]

## 2019-12-02 ENCOUNTER — Other Ambulatory Visit: Payer: Self-pay | Admitting: General Surgery

## 2019-12-02 ENCOUNTER — Other Ambulatory Visit: Payer: Self-pay | Admitting: Family Medicine

## 2019-12-02 DIAGNOSIS — C50412 Malignant neoplasm of upper-outer quadrant of left female breast: Secondary | ICD-10-CM | POA: Diagnosis not present

## 2019-12-02 NOTE — Progress Notes (Signed)
Patient's chart reviewed with Dr Elgie Congo, due to patients recent admissions for resp failure and CHF, going home on continuous oxygen at 2L, she will be better served being done at [place].

## 2019-12-05 ENCOUNTER — Other Ambulatory Visit (HOSPITAL_COMMUNITY)
Admission: RE | Admit: 2019-12-05 | Discharge: 2019-12-05 | Disposition: A | Discharge status: Home / Self Care | Payer: Medicare Other | Source: Ambulatory Visit | Attending: General Surgery | Admitting: General Surgery

## 2019-12-05 DIAGNOSIS — Z01812 Encounter for preprocedural laboratory examination: Secondary | ICD-10-CM | POA: Diagnosis not present

## 2019-12-05 DIAGNOSIS — Z20822 Contact with and (suspected) exposure to covid-19: Secondary | ICD-10-CM | POA: Diagnosis not present

## 2019-12-06 LAB — NOVEL CORONAVIRUS, NAA (HOSP ORDER, SEND-OUT TO REF LAB; TAT 18-24 HRS): SARS-CoV-2, NAA: NOT DETECTED

## 2019-12-07 ENCOUNTER — Other Ambulatory Visit: Payer: Self-pay | Admitting: Family Medicine

## 2019-12-07 NOTE — Pre-Procedure Instructions (Signed)
Maeby Luhrsen Coley-Heath  12/07/2019    Your procedure is scheduled on Wednesday, December 09, 2019 at 8:30 AM.   Report to [facility] Entrance "A" Admitting Office at 6:30 AM.   Call this number if you have problems the morning of surgery: [phone]   Remember:  Do not eat food after midnight tonight.  You may drink clear liquids until 4:30 AM.  Clear liquids allowed are:  Water, Juice (non-citric and without pulp), Carbonated beverages, Clear Tea, Black Coffee only, Plain Jell-O only, Gatorade and Plain Popsicles only   Drink the 10 ounce bottle of water by 4:30 AM.     Take these medicines the morning of surgery with A SIP OF WATER: Esomeprazole (Nexium), Lyrica, Pepcid - if needed, Oxycodone - if needed, Ondansetron (Zofran) - if needed, Symbicort inhaler, Albuterol nebulizer - if needed, Albuterol inhaler - if needed (bring this inhaler with you day of surgery.  Stop Herbal medications (Cinnamon, Glucose chondroitin, etc) and Multivitamins as of today. Stop Aspirin as instructed by surgeon.  Do not take Metformin or Glipizide day of surgery.   How to Manage Your Diabetes Before Surgery   Why is it important to control my blood sugar before and after surgery?   Improving blood sugar levels before and after surgery helps healing and can limit problems.  A way of improving blood sugar control is eating a healthy diet by:  - Eating less sugar and carbohydrates  - Increasing activity/exercise  - Talk with your doctor about reaching your blood sugar goals  High blood sugars (greater than 180 mg/dL) can raise your risk of infections and slow down your recovery so you will need to focus on controlling your diabetes during the weeks before surgery.  Make sure that the doctor who takes care of your diabetes knows about your planned surgery including the date and location.  How do I manage my blood sugars before surgery?   Check your blood sugar at least 4 times a day, 2  days before surgery to make sure that they are not too high or low.  Check your blood sugar the morning of your surgery when you wake up and every 2 hours until you get to the Short-Stay unit.  Treat a low blood sugar (less than 70 mg/dL) with 1/2 cup of clear juice (cranberry or apple), 4 glucose tablets, OR glucose gel.  Recheck blood sugar in 15 minutes after treatment (to make sure it is greater than 70 mg/dL).  If blood sugar is not greater than 70 mg/dL on re-check, call [phone] for further instructions.   Report your blood sugar to the Short-Stay nurse when you get to Short-Stay.  References:  [facility], 2007 "How to Manage your Diabetes Before and After Surgery".    Do not wear jewelry, make-up or nail polish.  Do not wear lotions, powders, perfumes or deodorant.  Do not shave 48 hours prior to surgery.    Do not bring valuables to the hospital.  [facility] is not responsible for any belongings or valuables.  Contacts, dentures or bridgework may not be worn into surgery.  Leave your suitcase in the car.  After surgery it may be brought to your room.  For patients admitted to the hospital, discharge time will be determined by your treatment team.  Patients discharged the day of surgery will not be allowed to drive home.   [place] - Preparing for Surgery  Before surgery, you can play  an important role.  Because skin is not sterile, your skin needs to be as free of germs as possible.  You can reduce the number of germs on you skin by washing with CHG (chlorahexidine gluconate) soap before surgery.  CHG is an antiseptic cleaner which kills germs and bonds with the skin to continue killing germs even after washing.  Oral Hygiene is also important in reducing the risk of infection.  Remember to brush your teeth with your regular toothpaste the morning of surgery.  Please DO NOT use if you have an allergy to CHG or antibacterial soaps.  If your  skin becomes reddened/irritated stop using the CHG and inform your nurse when you arrive at Short Stay.  Do not shave (including legs and underarms) for at least 48 hours prior to the first CHG shower.  You may shave your face.  Please follow these instructions carefully:   1.  Shower with CHG Soap the night before surgery and the morning of Surgery.  2.  If you choose to wash your hair, wash your hair first as usual with your normal shampoo.  3.  After you shampoo, rinse your hair and body thoroughly to remove the shampoo. 4.  Use CHG as you would any other liquid soap.  You can apply chg directly to the skin and wash gently with a      scrungie or washcloth.           5.  Apply the CHG Soap to your body ONLY FROM THE NECK DOWN.   Do not use on open wounds or open sores. Avoid contact with your eyes, ears, mouth and genitals (private parts).  Wash genitals (private parts) with your normal soap - do this prior to using CHG soap.  6.  Wash thoroughly, paying special attention to the area where your surgery will be performed.  7.  Thoroughly rinse your body with warm water from the neck down.  8.  DO NOT shower/wash with your normal soap after using and rinsing off the CHG Soap.  9.  Pat yourself dry with a clean towel.            10.  Wear clean pajamas.            11.  Place clean sheets on your bed the night of your first shower and do not sleep with pets.  Day of Surgery  Shower as above. Do not apply any lotions/deodorants the morning of surgery.   Please wear clean clothes to the hospital. Remember to brush your teeth with toothpaste.  Please read over the fact sheets that you were given.

## 2019-12-08 ENCOUNTER — Other Ambulatory Visit: Payer: Self-pay

## 2019-12-08 ENCOUNTER — Other Ambulatory Visit: Payer: Self-pay | Admitting: *Deleted

## 2019-12-08 ENCOUNTER — Encounter (HOSPITAL_COMMUNITY)
Admission: RE | Admit: 2019-12-08 | Discharge: 2019-12-08 | Disposition: A | Discharge status: Home / Self Care | Payer: Medicare Other | Source: Ambulatory Visit | Attending: General Surgery | Admitting: General Surgery

## 2019-12-08 ENCOUNTER — Encounter (HOSPITAL_COMMUNITY): Payer: Self-pay

## 2019-12-08 ENCOUNTER — Other Ambulatory Visit: Payer: Self-pay | Admitting: General Surgery

## 2019-12-08 ENCOUNTER — Ambulatory Visit
Admission: RE | Admit: 2019-12-08 | Discharge: 2019-12-08 | Disposition: A | Discharge status: Home / Self Care | Payer: Medicare Other | Source: Ambulatory Visit | Attending: General Surgery | Admitting: General Surgery

## 2019-12-08 DIAGNOSIS — C50912 Malignant neoplasm of unspecified site of left female breast: Secondary | ICD-10-CM | POA: Insufficient documentation

## 2019-12-08 DIAGNOSIS — Z01812 Encounter for preprocedural laboratory examination: Secondary | ICD-10-CM | POA: Insufficient documentation

## 2019-12-08 DIAGNOSIS — I2510 Atherosclerotic heart disease of native coronary artery without angina pectoris: Secondary | ICD-10-CM | POA: Diagnosis not present

## 2019-12-08 DIAGNOSIS — I252 Old myocardial infarction: Secondary | ICD-10-CM | POA: Insufficient documentation

## 2019-12-08 DIAGNOSIS — E119 Type 2 diabetes mellitus without complications: Secondary | ICD-10-CM | POA: Diagnosis not present

## 2019-12-08 DIAGNOSIS — Z7982 Long term (current) use of aspirin: Secondary | ICD-10-CM | POA: Diagnosis not present

## 2019-12-08 DIAGNOSIS — G4733 Obstructive sleep apnea (adult) (pediatric): Secondary | ICD-10-CM | POA: Diagnosis not present

## 2019-12-08 DIAGNOSIS — Z79899 Other long term (current) drug therapy: Secondary | ICD-10-CM | POA: Insufficient documentation

## 2019-12-08 DIAGNOSIS — I509 Heart failure, unspecified: Secondary | ICD-10-CM | POA: Diagnosis not present

## 2019-12-08 DIAGNOSIS — C50412 Malignant neoplasm of upper-outer quadrant of left female breast: Secondary | ICD-10-CM

## 2019-12-08 DIAGNOSIS — I110 Hypertensive heart disease with heart failure: Secondary | ICD-10-CM | POA: Diagnosis not present

## 2019-12-08 DIAGNOSIS — D0512 Intraductal carcinoma in situ of left breast: Secondary | ICD-10-CM | POA: Diagnosis not present

## 2019-12-08 LAB — COMPREHENSIVE METABOLIC PANEL
ALT: 29 U/L (ref 0–44)
AST: 28 U/L (ref 15–41)
Albumin: 3.3 g/dL — ABNORMAL LOW (ref 3.5–5.0)
Alkaline Phosphatase: 63 U/L (ref 38–126)
Anion gap: 14 (ref 5–15)
BUN: 6 mg/dL — ABNORMAL LOW (ref 8–23)
CO2: 27 mmol/L (ref 22–32)
Calcium: 9.2 mg/dL (ref 8.9–10.3)
Chloride: 102 mmol/L (ref 98–111)
Creatinine, Ser: 0.6 mg/dL (ref 0.44–1.00)
GFR calc Af Amer: >60 mL/min (ref >60)
GFR calc non Af Amer: >60 mL/min (ref >60)
Glucose, Bld: 150 mg/dL — ABNORMAL HIGH (ref 70–99)
Potassium: 3.7 mmol/L (ref 3.5–5.1)
Sodium: 143 mmol/L (ref 135–145)
Total Bilirubin: 0.3 mg/dL (ref 0.3–1.2)
Total Protein: 6.6 g/dL (ref 6.5–8.1)

## 2019-12-08 LAB — CBC
HCT: 40.9 % (ref 36.0–46.0)
Hemoglobin: 12.5 g/dL (ref 12.0–15.0)
MCH: 29.4 pg (ref 26.0–34.0)
MCHC: 30.6 g/dL (ref 30.0–36.0)
MCV: 96.2 fL (ref 80.0–100.0)
Platelets: 265 10*3/uL (ref 150–400)
RBC: 4.25 MIL/uL (ref 3.87–5.11)
RDW: 14.4 % (ref 11.5–15.5)
WBC: 5.7 10*3/uL (ref 4.0–10.5)
nRBC: 0 % (ref 0.0–0.2)

## 2019-12-08 LAB — GLUCOSE, CAPILLARY: Glucose-Capillary: 163 mg/dL — ABNORMAL HIGH (ref 70–99)

## 2019-12-08 NOTE — Progress Notes (Signed)
Anesthesia Chart Review:  Previously followed with EP cardiology at [facility] for hx of palpitations and pre syncope. She had a loop recorder placed in 2015. Per last OV note 04/24/18 "Extensive review of her records indicate that she has not had any episodes of atrial fibrillation or bradycardia episodes. Numerous episodes which the device have called pauses or atrial fibrillation have actually been under sensing or noise." Her device was noted to be at RRT as of 03/25/18 and decision was made not to implant new device as no significant events had been captured during the life of current device.   She also follows with Dr. Einar Gip for general cardiology. Per last OV note 01/30/18 " he is here on a 86-monthoffice visit and follow-up, except for sharp chest pain when she cannot take a deep breath that occurs at night occasionally she has remained stable and has not had any clinical evidence of heart failure.  Echocardiogram revealing right ventricular dilation secondary to severe restrictive lung disease without evidence of pulmonary hypertension and no clinical evidence of heart failure and no change from 6 months ago.  Hence at this point I will make her visits PRN with me.  Blood pressures well controlled.  She is on statins and diabetes is being managed by her PCP which patient states is also well controlled.  She is on appropriate medical therapy."  Follows with Dr. AElsworth Sohofor OSA and cough variant asthma vs. Pseudoasthma. Spirometry 07/2018 FVC 1.64 (46%), FEV1 1.34 (49%), ratio 82 (104%) showed restrictive but not obstructive pattern. Pt recently recently diagnosed with triple negative breast cancer and underwent neoadjuvant chemotherapy with Cytoxan and docetaxel. She had 2 hospital admissions -initially was treated for diastolic heart failure, CT angiogram was negative.Readmitted 11/27 with acute hypoxic respiratory failure in the setting of diffuse bilateral groundglass pulmonary infiltrates -after extensive  work-up, negative respiratory viral panel Covid testing, she was felt to have drug-induced pneumonitis -not clear that Taxotere was causing her symptoms but since she had had recurrent after each round of chemotherapy, it was decided to stop further chemotherapy and proceed directly with surgery. She was last seen by Dr. AElsworth Soho12/15/20, and per note her breathing had improved, she was working to wean her O2 use, and she was cleared for breast surgery.   Documented history of difficult intubation in pt chart since at least 2012. I spoke with patient and she denies this, says she has previously had general endotracheal intubation without issue - reports procedure was 20+ years ago so we do not have records.  Reviewed anesthesia records available in Epic as well as [facility] all procedures were performed under MAC, spinal, or GA with LMA, no complications reported. She does have a history of TMJ and reports she has had one dislocation many years ago that required reduction in the ED. She also has chronic uncontrolled GERD, reports this continues to be moderate to severe.  Preop labs reviewed, unremarkable.   EKG 10/30/19: Sinus rhythm. Rate 80. Borderline left axis deviation. Borderline T abnormalities, anterior leads. Baseline wander in lead(s) III V5. Nonspecific T waves appear similar to Oct 2020  TTE 10/01/19:  1. Left ventricular ejection fraction, by visual estimation, is 60 to 65%. The left ventricle has normal function. There is no left ventricular hypertrophy.  2. The left ventricle has no regional wall motion abnormalities.  3. Global right ventricle has normal systolic function.The right ventricular size is mildly enlarged. No increase  in right ventricular wall thickness.  4. Left atrial size was normal.  5. Right atrial size was normal.  6. Presence of pericardial fat pad.  7. Mild to moderate mitral annular calcification.  8.  The mitral valve is degenerative. No evidence of mitral valve regurgitation. No evidence of mitral stenosis.  9. The tricuspid valve is grossly normal. Tricuspid valve regurgitation is trivial. 10. The aortic valve is tricuspid. Aortic valve regurgitation is not visualized. No evidence of aortic valve sclerosis or stenosis. 11. There is Mild calcification of the aortic valve. 12. There is Mild thickening of the aortic valve. 13. The pulmonic valve was grossly normal. Pulmonic valve regurgitation is not visualized. 14. Mild plaque invoving the ascending aorta. 15. Normal pulmonary artery systolic pressure. 16. The tricuspid regurgitant velocity is 1.94 m/s, and with an assumed right atrial pressure of 15 mmHg, the estimated right ventricular systolic pressure is normal at 30.1 mmHg. 17. The inferior vena cava is dilated in size with <50% respiratory variability, suggesting right atrial pressure of 15 mmHg.   Wynonia Musty [facility] Short Stay Center/Anesthesiology Phone [phone] 12/08/2019 2:23 PM

## 2019-12-08 NOTE — Patient Outreach (Signed)
[facility] [facility]) Care Management  12/08/2019  Shelby Anderson [birth year] [id number]   Call placed to member to follow up on EMMI videos for breast cancer treatment options.  She report she had her pre-admit testing completed today.  She will have lumpectomy versus mastectomy and she will see radiology oncologist after surgery tomorrow prior to being discharged.  She does not expect to be admitted overnight.  Denies any questions at this time, will follow up with member within the next week.  Valente David, [place], MSN [place] [phone]

## 2019-12-08 NOTE — Progress Notes (Signed)
PCP - Dr. [name] Cardiologist - Dr. Einar Gip  Chest x-ray - 11/02/19 EKG - 10/30/19 Stress Test -  ECHO - 10/01/19 Cardiac Cath - 2012  Pt has a loop recorder  Sleep Study - 10/02/18 CPAP - yes  Fasting Blood Sugar - 118-268 Checks Blood Sugar __1-2___ times a day  Blood Thinner Instructions: N/A Aspirin Instructions: No instructions given, pt states she won't take it tonight  ERAS Protcol - yes PRE-SURGERY Ensure or G2- 10 ounce water  COVID TEST- 12/05/19 - negative  Called Dr. Cristal Generous office to have him add "port-a-cath removal" to pt's consent order   Anesthesia review: yes  Patient denies shortness of breath, fever, cough and chest pain at PAT appointment   All instructions explained to the patient, with a verbal understanding of the material. Patient agrees to go over the instructions while at home for a better understanding. Patient also instructed to self quarantine after being tested for COVID-19. The opportunity to ask questions was provided.

## 2019-12-08 NOTE — Anesthesia Preprocedure Evaluation (Addendum)
Anesthesia Evaluation  Patient identified by MRN, date of birth, ID band Patient awake    Reviewed: Allergy & Precautions, NPO status , Patient's Chart, lab work & pertinent test results  History of Anesthesia Complications (+) PONVNegative for: history of anesthetic complications  Airway Mallampati: II  TM Distance: >3 FB Neck ROM: Full    Dental  (+) Poor Dentition, Edentulous Upper, Missing, Loose,    Pulmonary shortness of breath, asthma , sleep apnea and Oxygen sleep apnea ,  Restrictive lung disease, possible drug-induced pneumonitis 2/2 chemo   Pulmonary exam normal        Cardiovascular hypertension, + CAD, + Past MI and +CHF  (-) Cardiac Stents and (-) CABG Normal cardiovascular exam     Neuro/Psych PSYCHIATRIC DISORDERS Anxiety Depression negative neurological ROS     GI/Hepatic hiatal hernia, PUD, GERD  Poorly Controlled,(+)     substance abuse  ,   Endo/Other  diabetes, Type 2, Oral Hypoglycemic Agents  Renal/GU negative Renal ROS  negative genitourinary   Musculoskeletal  (+) Fibromyalgia -, narcotic dependent  Abdominal   Peds  Hematology negative hematology ROS (+)   Anesthesia Other Findings Breast cancer s/p chemo  Echo 10/01/19: EF 60-65%, normal RV function with mild RV enlargement, no evidence of pulm HTN, valves unremarkable  Reproductive/Obstetrics                           Anesthesia Physical Anesthesia Plan  ASA: III  Anesthesia Plan: General   Post-op Pain Management: GA combined w/ Regional for post-op pain   Induction: Intravenous  PONV Risk Score and Plan: 4 or greater and Ondansetron, Dexamethasone, Treatment may vary due to age or medical condition, Midazolam, TIVA and Propofol infusion  Airway Management Planned: LMA  Additional Equipment: None  Intra-op Plan:   Post-operative Plan: Extubation in OR  Informed Consent: I have reviewed the  patients History and Physical, chart, labs and discussed the procedure including the risks, benefits and alternatives for the proposed anesthesia with the patient or authorized representative who has indicated his/her understanding and acceptance.     Dental advisory given  Plan Discussed with:   Anesthesia Plan Comments: (Ketamine + Propofol w/ PECS block)     Anesthesia Quick Evaluation

## 2019-12-09 ENCOUNTER — Ambulatory Visit (HOSPITAL_BASED_OUTPATIENT_CLINIC_OR_DEPARTMENT_OTHER)
Admission: RE | Admit: 2019-12-09 | Discharge: 2019-12-09 | Disposition: A | Discharge status: Home / Self Care | Payer: Medicare Other | Attending: General Surgery | Admitting: General Surgery

## 2019-12-09 ENCOUNTER — Ambulatory Visit (HOSPITAL_COMMUNITY): Payer: Medicare Other

## 2019-12-09 ENCOUNTER — Encounter (HOSPITAL_COMMUNITY): Payer: Self-pay | Admitting: General Surgery

## 2019-12-09 ENCOUNTER — Encounter (HOSPITAL_COMMUNITY)
Admission: RE | Disposition: A | Discharge status: Home / Self Care | Payer: Self-pay | Source: Home / Self Care | Attending: General Surgery

## 2019-12-09 ENCOUNTER — Encounter (HOSPITAL_COMMUNITY)
Admission: RE | Admit: 2019-12-09 | Discharge: 2019-12-09 | Disposition: A | Discharge status: Home / Self Care | Payer: Medicare Other | Source: Ambulatory Visit | Attending: General Surgery | Admitting: General Surgery

## 2019-12-09 ENCOUNTER — Ambulatory Visit (HOSPITAL_COMMUNITY): Payer: Medicare Other | Admitting: Physician Assistant

## 2019-12-09 ENCOUNTER — Ambulatory Visit
Admission: RE | Admit: 2019-12-09 | Discharge: 2019-12-09 | Disposition: A | Discharge status: Home / Self Care | Payer: Medicare Other | Source: Ambulatory Visit | Attending: General Surgery | Admitting: General Surgery

## 2019-12-09 DIAGNOSIS — Z79899 Other long term (current) drug therapy: Secondary | ICD-10-CM | POA: Diagnosis not present

## 2019-12-09 DIAGNOSIS — F330 Major depressive disorder, recurrent, mild: Secondary | ICD-10-CM | POA: Insufficient documentation

## 2019-12-09 DIAGNOSIS — E119 Type 2 diabetes mellitus without complications: Secondary | ICD-10-CM | POA: Insufficient documentation

## 2019-12-09 DIAGNOSIS — N6092 Unspecified benign mammary dysplasia of left breast: Secondary | ICD-10-CM | POA: Diagnosis not present

## 2019-12-09 DIAGNOSIS — Z9221 Personal history of antineoplastic chemotherapy: Secondary | ICD-10-CM | POA: Diagnosis not present

## 2019-12-09 DIAGNOSIS — K219 Gastro-esophageal reflux disease without esophagitis: Secondary | ICD-10-CM | POA: Diagnosis not present

## 2019-12-09 DIAGNOSIS — F419 Anxiety disorder, unspecified: Secondary | ICD-10-CM | POA: Insufficient documentation

## 2019-12-09 DIAGNOSIS — Z7984 Long term (current) use of oral hypoglycemic drugs: Secondary | ICD-10-CM | POA: Diagnosis not present

## 2019-12-09 DIAGNOSIS — Z452 Encounter for adjustment and management of vascular access device: Secondary | ICD-10-CM | POA: Insufficient documentation

## 2019-12-09 DIAGNOSIS — E785 Hyperlipidemia, unspecified: Secondary | ICD-10-CM | POA: Insufficient documentation

## 2019-12-09 DIAGNOSIS — N6489 Other specified disorders of breast: Secondary | ICD-10-CM | POA: Insufficient documentation

## 2019-12-09 DIAGNOSIS — N6022 Fibroadenosis of left breast: Secondary | ICD-10-CM | POA: Diagnosis not present

## 2019-12-09 DIAGNOSIS — C50411 Malignant neoplasm of upper-outer quadrant of right female breast: Secondary | ICD-10-CM | POA: Diagnosis not present

## 2019-12-09 DIAGNOSIS — C50912 Malignant neoplasm of unspecified site of left female breast: Secondary | ICD-10-CM | POA: Diagnosis not present

## 2019-12-09 DIAGNOSIS — Z7982 Long term (current) use of aspirin: Secondary | ICD-10-CM | POA: Diagnosis not present

## 2019-12-09 DIAGNOSIS — Z96652 Presence of left artificial knee joint: Secondary | ICD-10-CM | POA: Diagnosis not present

## 2019-12-09 DIAGNOSIS — J45909 Unspecified asthma, uncomplicated: Secondary | ICD-10-CM | POA: Diagnosis not present

## 2019-12-09 DIAGNOSIS — I2510 Atherosclerotic heart disease of native coronary artery without angina pectoris: Secondary | ICD-10-CM | POA: Diagnosis not present

## 2019-12-09 DIAGNOSIS — Z85828 Personal history of other malignant neoplasm of skin: Secondary | ICD-10-CM | POA: Insufficient documentation

## 2019-12-09 DIAGNOSIS — I509 Heart failure, unspecified: Secondary | ICD-10-CM | POA: Diagnosis not present

## 2019-12-09 DIAGNOSIS — I252 Old myocardial infarction: Secondary | ICD-10-CM | POA: Insufficient documentation

## 2019-12-09 DIAGNOSIS — Z171 Estrogen receptor negative status [ER-]: Secondary | ICD-10-CM | POA: Diagnosis not present

## 2019-12-09 DIAGNOSIS — Z170 Estrogen receptor positive status [ER+]: Secondary | ICD-10-CM

## 2019-12-09 DIAGNOSIS — G4733 Obstructive sleep apnea (adult) (pediatric): Secondary | ICD-10-CM | POA: Insufficient documentation

## 2019-12-09 DIAGNOSIS — G8918 Other acute postprocedural pain: Secondary | ICD-10-CM | POA: Diagnosis not present

## 2019-12-09 DIAGNOSIS — I110 Hypertensive heart disease with heart failure: Secondary | ICD-10-CM | POA: Diagnosis not present

## 2019-12-09 DIAGNOSIS — M797 Fibromyalgia: Secondary | ICD-10-CM | POA: Diagnosis not present

## 2019-12-09 DIAGNOSIS — C50412 Malignant neoplasm of upper-outer quadrant of left female breast: Secondary | ICD-10-CM

## 2019-12-09 DIAGNOSIS — N6012 Diffuse cystic mastopathy of left breast: Secondary | ICD-10-CM | POA: Diagnosis not present

## 2019-12-09 DIAGNOSIS — Z7951 Long term (current) use of inhaled steroids: Secondary | ICD-10-CM | POA: Insufficient documentation

## 2019-12-09 DIAGNOSIS — I420 Dilated cardiomyopathy: Secondary | ICD-10-CM | POA: Diagnosis not present

## 2019-12-09 HISTORY — PX: PORT-A-CATH REMOVAL: SHX5289

## 2019-12-09 HISTORY — PX: BREAST LUMPECTOMY: SHX2

## 2019-12-09 HISTORY — PX: BREAST LUMPECTOMY WITH RADIOACTIVE SEED AND SENTINEL LYMPH NODE BIOPSY: SHX6550

## 2019-12-09 LAB — GLUCOSE, CAPILLARY
Glucose-Capillary: 129 mg/dL — ABNORMAL HIGH (ref 70–99)
Glucose-Capillary: 124 mg/dL — ABNORMAL HIGH (ref 70–99)

## 2019-12-09 SURGERY — BREAST LUMPECTOMY WITH RADIOACTIVE SEED AND SENTINEL LYMPH NODE BIOPSY
Anesthesia: General

## 2019-12-09 MED ORDER — SODIUM CHLORIDE (PF) 0.9 % IJ SOLN
INTRAVENOUS | Status: DC | PRN
  Administered 2019-12-09: 5 mL via INTRAMUSCULAR

## 2019-12-09 MED ORDER — ROPIVACAINE HCL 5 MG/ML IJ SOLN
INTRAMUSCULAR | Status: DC | PRN
  Administered 2019-12-09: 30 mL via PERINEURAL

## 2019-12-09 MED ORDER — ONDANSETRON HCL 4 MG/2ML IJ SOLN
INTRAMUSCULAR | Status: DC | PRN
  Administered 2019-12-09: 4 mg via INTRAVENOUS

## 2019-12-09 MED ORDER — KETAMINE HCL 50 MG/5ML IJ SOSY
PREFILLED_SYRINGE | INTRAMUSCULAR | Status: AC
  Filled 2019-12-09: qty 5

## 2019-12-09 MED ORDER — LACTATED RINGERS IV SOLN: INTRAVENOUS | Status: DC

## 2019-12-09 MED ORDER — OXYCODONE HCL 5 MG PO TABS
5.0000 mg | ORAL_TABLET | Freq: Once | ORAL | Status: AC | PRN
  Administered 2019-12-09: 5 mg via ORAL

## 2019-12-09 MED ORDER — FENTANYL CITRATE (PF) 250 MCG/5ML IJ SOLN
INTRAMUSCULAR | Status: AC
  Filled 2019-12-09: qty 5

## 2019-12-09 MED ORDER — LIDOCAINE 2% (20 MG/ML) 5 ML SYRINGE
INTRAMUSCULAR | Status: DC | PRN
  Administered 2019-12-09: 60 mg via INTRAVENOUS

## 2019-12-09 MED ORDER — MIDAZOLAM HCL 5 MG/5ML IJ SOLN
INTRAMUSCULAR | Status: DC | PRN
  Administered 2019-12-09 (×2): 1 mg via INTRAVENOUS

## 2019-12-09 MED ORDER — MIDAZOLAM HCL 2 MG/2ML IJ SOLN
INTRAMUSCULAR | Status: AC
  Filled 2019-12-09: qty 2

## 2019-12-09 MED ORDER — CIPROFLOXACIN IN D5W 400 MG/200ML IV SOLN
400.0000 mg | INTRAVENOUS | Status: AC
  Administered 2019-12-09: 400 mg via INTRAVENOUS
  Filled 2019-12-09: qty 200

## 2019-12-09 MED ORDER — PROPOFOL 10 MG/ML IV BOLUS
INTRAVENOUS | Status: DC | PRN
  Administered 2019-12-09: 150 ug via INTRAVENOUS
  Administered 2019-12-09: 30 ug via INTRAVENOUS

## 2019-12-09 MED ORDER — ONDANSETRON HCL 4 MG/2ML IJ SOLN
INTRAMUSCULAR | Status: AC
  Filled 2019-12-09: qty 2

## 2019-12-09 MED ORDER — BUPIVACAINE HCL (PF) 0.25 % IJ SOLN
INTRAMUSCULAR | Status: DC | PRN
  Administered 2019-12-09: 11 mL

## 2019-12-09 MED ORDER — DIPHENHYDRAMINE HCL 50 MG/ML IJ SOLN
INTRAMUSCULAR | Status: AC
  Filled 2019-12-09: qty 1

## 2019-12-09 MED ORDER — OXYCODONE HCL 5 MG/5ML PO SOLN: 5.0000 mg | Freq: Once | ORAL | Status: AC | PRN

## 2019-12-09 MED ORDER — KETAMINE HCL 10 MG/ML IJ SOLN
INTRAMUSCULAR | Status: DC | PRN
  Administered 2019-12-09: 20 mg via INTRAVENOUS
  Administered 2019-12-09: 10 mg via INTRAVENOUS

## 2019-12-09 MED ORDER — ONDANSETRON HCL 4 MG/2ML IJ SOLN: 4.0000 mg | Freq: Once | INTRAMUSCULAR | Status: DC | PRN

## 2019-12-09 MED ORDER — ACETAMINOPHEN 500 MG PO TABS
1000.0000 mg | ORAL_TABLET | ORAL | Status: AC
  Administered 2019-12-09: 1000 mg via ORAL
  Filled 2019-12-09: qty 2

## 2019-12-09 MED ORDER — LIDOCAINE 2% (20 MG/ML) 5 ML SYRINGE
INTRAMUSCULAR | Status: AC
  Filled 2019-12-09: qty 5

## 2019-12-09 MED ORDER — METHYLENE BLUE 0.5 % INJ SOLN
INTRAVENOUS | Status: AC
  Filled 2019-12-09: qty 10

## 2019-12-09 MED ORDER — 0.9 % SODIUM CHLORIDE (POUR BTL) OPTIME
TOPICAL | Status: DC | PRN
  Administered 2019-12-09: 1000 mL

## 2019-12-09 MED ORDER — CHLORHEXIDINE GLUCONATE CLOTH 2 % EX PADS: 6.0000 | MEDICATED_PAD | Freq: Once | CUTANEOUS | Status: DC

## 2019-12-09 MED ORDER — ACETAMINOPHEN 650 MG RE SUPP: 650.0000 mg | RECTAL | Status: DC | PRN

## 2019-12-09 MED ORDER — SODIUM CHLORIDE (PF) 0.9 % IJ SOLN
INTRAMUSCULAR | Status: AC
  Filled 2019-12-09: qty 10

## 2019-12-09 MED ORDER — FENTANYL CITRATE (PF) 100 MCG/2ML IJ SOLN
INTRAMUSCULAR | Status: DC | PRN
  Administered 2019-12-09 (×2): 50 ug via INTRAVENOUS

## 2019-12-09 MED ORDER — FENTANYL CITRATE (PF) 100 MCG/2ML IJ SOLN
25.0000 ug | INTRAMUSCULAR | Status: DC | PRN
  Administered 2019-12-09 (×2): 50 ug via INTRAVENOUS

## 2019-12-09 MED ORDER — FENTANYL CITRATE (PF) 100 MCG/2ML IJ SOLN
INTRAMUSCULAR | Status: AC
  Filled 2019-12-09: qty 2

## 2019-12-09 MED ORDER — ENSURE PRE-SURGERY PO LIQD: 296.0000 mL | Freq: Once | ORAL | Status: DC

## 2019-12-09 MED ORDER — TECHNETIUM TC 99M SULFUR COLLOID FILTERED
1.0000 | Freq: Once | INTRAVENOUS | Status: AC | PRN
  Administered 2019-12-09: 1 via INTRADERMAL

## 2019-12-09 MED ORDER — ACETAMINOPHEN 325 MG PO TABS: 650.0000 mg | ORAL_TABLET | ORAL | Status: DC | PRN

## 2019-12-09 MED ORDER — PROPOFOL 500 MG/50ML IV EMUL
INTRAVENOUS | Status: DC | PRN
  Administered 2019-12-09: 100 ug/kg/min via INTRAVENOUS

## 2019-12-09 MED ORDER — GABAPENTIN 100 MG PO CAPS
100.0000 mg | ORAL_CAPSULE | ORAL | Status: AC
  Administered 2019-12-09: 100 mg via ORAL
  Filled 2019-12-09: qty 1

## 2019-12-09 MED ORDER — KETAMINE HCL 50 MG/5ML IJ SOSY
PREFILLED_SYRINGE | INTRAMUSCULAR | Status: AC
  Filled 2019-12-09: qty 10

## 2019-12-09 MED ORDER — OXYCODONE HCL 5 MG PO TABS
ORAL_TABLET | ORAL | Status: AC
  Filled 2019-12-09: qty 1

## 2019-12-09 MED ORDER — HYDROCODONE-ACETAMINOPHEN 10-325 MG PO TABS: 1.0000 | ORAL_TABLET | Freq: Four times a day (QID) | ORAL | 0 refills | Status: DC | PRN

## 2019-12-09 MED ORDER — PROPOFOL 10 MG/ML IV BOLUS
INTRAVENOUS | Status: AC
  Filled 2019-12-09: qty 20

## 2019-12-09 MED ORDER — DEXAMETHASONE SODIUM PHOSPHATE 10 MG/ML IJ SOLN
INTRAMUSCULAR | Status: AC
  Filled 2019-12-09: qty 1

## 2019-12-09 MED ORDER — BUPIVACAINE HCL (PF) 0.25 % IJ SOLN
INTRAMUSCULAR | Status: AC
  Filled 2019-12-09: qty 30

## 2019-12-09 SURGICAL SUPPLY — 60 items
ADH SKN CLS APL DERMABOND .7 (GAUZE/BANDAGES/DRESSINGS) ×6
APL PRP STRL LF DISP 70% ISPRP (MISCELLANEOUS) ×2
APPLIER CLIP 9.375 MED OPEN (MISCELLANEOUS) ×4
APR CLP MED 9.3 20 MLT OPN (MISCELLANEOUS) ×2
BINDER BREAST LRG (GAUZE/BANDAGES/DRESSINGS) IMPLANT
BINDER BREAST XLRG (GAUZE/BANDAGES/DRESSINGS) ×2 IMPLANT
CANISTER SUCT 3000ML PPV (MISCELLANEOUS) ×4 IMPLANT
CHLORAPREP W/TINT 10.5 ML (MISCELLANEOUS) ×4 IMPLANT
CHLORAPREP W/TINT 26 (MISCELLANEOUS) ×4 IMPLANT
CLIP APPLIE 9.375 MED OPEN (MISCELLANEOUS) IMPLANT
CLIP VESOCCLUDE MED 6/CT (CLIP) ×4 IMPLANT
CLOSURE STERI-STRIP 1/2X4 (GAUZE/BANDAGES/DRESSINGS) ×3
CLOSURE WOUND 1/2 X4 (GAUZE/BANDAGES/DRESSINGS) ×1
CLSR STERI-STRIP ANTIMIC 1/2X4 (GAUZE/BANDAGES/DRESSINGS) ×3 IMPLANT
COVER PROBE W GEL 5X96 (DRAPES) ×4 IMPLANT
COVER SURGICAL LIGHT HANDLE (MISCELLANEOUS) ×4 IMPLANT
COVER WAND RF STERILE (DRAPES) ×2 IMPLANT
DECANTER SPIKE VIAL GLASS SM (MISCELLANEOUS) ×2 IMPLANT
DERMABOND ADVANCED (GAUZE/BANDAGES/DRESSINGS) ×6
DERMABOND ADVANCED .7 DNX12 (GAUZE/BANDAGES/DRESSINGS) ×2 IMPLANT
DEVICE DUBIN SPECIMEN MAMMOGRA (MISCELLANEOUS) ×4 IMPLANT
DRAPE CHEST BREAST 15X10 FENES (DRAPES) ×4 IMPLANT
DRAPE LAPAROTOMY 100X72 PEDS (DRAPES) ×2 IMPLANT
ELECT CAUTERY BLADE 6.4 (BLADE) ×4 IMPLANT
ELECT COATED BLADE 2.86 ST (ELECTRODE) ×4 IMPLANT
ELECT REM PT RETURN 9FT ADLT (ELECTROSURGICAL) ×4
ELECTRODE REM PT RTRN 9FT ADLT (ELECTROSURGICAL) ×2 IMPLANT
GAUZE 4X4 16PLY RFD (DISPOSABLE) ×4 IMPLANT
GLOVE BIO SURGEON STRL SZ7 (GLOVE) ×4 IMPLANT
GLOVE BIOGEL PI IND STRL 7.5 (GLOVE) ×2 IMPLANT
GLOVE BIOGEL PI INDICATOR 7.5 (GLOVE) ×2
GOWN STRL REUS W/ TWL LRG LVL3 (GOWN DISPOSABLE) ×4 IMPLANT
GOWN STRL REUS W/TWL LRG LVL3 (GOWN DISPOSABLE) ×8
ILLUMINATOR WAVEGUIDE N/F (MISCELLANEOUS) IMPLANT
KIT BASIN OR (CUSTOM PROCEDURE TRAY) ×4 IMPLANT
KIT MARKER MARGIN INK (KITS) ×4 IMPLANT
KIT TURNOVER KIT B (KITS) ×4 IMPLANT
NDL 18GX1X1/2 (RX/OR ONLY) (NEEDLE) IMPLANT
NDL FILTER BLUNT 18X1 1/2 (NEEDLE) IMPLANT
NDL HYPO 25GX1X1/2 BEV (NEEDLE) ×2 IMPLANT
NEEDLE 18GX1X1/2 (RX/OR ONLY) (NEEDLE) ×4 IMPLANT
NEEDLE FILTER BLUNT 18X 1/2SAF (NEEDLE) ×2
NEEDLE FILTER BLUNT 18X1 1/2 (NEEDLE) ×2 IMPLANT
NEEDLE HYPO 25GX1X1/2 BEV (NEEDLE) ×8 IMPLANT
NS IRRIG 1000ML POUR BTL (IV SOLUTION) ×4 IMPLANT
PACK GENERAL/GYN (CUSTOM PROCEDURE TRAY) ×4 IMPLANT
PAD ARMBOARD 7.5X6 YLW CONV (MISCELLANEOUS) ×8 IMPLANT
PENCIL SMOKE EVACUATOR (MISCELLANEOUS) ×4 IMPLANT
RETRACTOR ONETRAX LX 90X20 (MISCELLANEOUS) ×2 IMPLANT
STRIP CLOSURE SKIN 1/2X4 (GAUZE/BANDAGES/DRESSINGS) ×3 IMPLANT
SUT MNCRL AB 4-0 PS2 18 (SUTURE) ×10 IMPLANT
SUT MON AB 5-0 PS2 18 (SUTURE) ×2 IMPLANT
SUT SILK 2 0 SH (SUTURE) ×2 IMPLANT
SUT VIC AB 2-0 SH 27 (SUTURE) ×12
SUT VIC AB 2-0 SH 27XBRD (SUTURE) ×4 IMPLANT
SUT VIC AB 3-0 SH 27 (SUTURE) ×12
SUT VIC AB 3-0 SH 27X BRD (SUTURE) ×4 IMPLANT
SYR CONTROL 10ML LL (SYRINGE) ×6 IMPLANT
TOWEL GREEN STERILE (TOWEL DISPOSABLE) ×4 IMPLANT
TOWEL GREEN STERILE FF (TOWEL DISPOSABLE) ×4 IMPLANT

## 2019-12-09 NOTE — Brief Op Note (Signed)
12/09/2019  10:04 AM  PATIENT:  Shelby Anderson  64 y.o. female  PRE-OPERATIVE DIAGNOSIS:  LEFT BREAST CANCER  POST-OPERATIVE DIAGNOSIS:  LEFT BREAST CANCER  PROCEDURE:  Procedure(s) with comments: LEFT BREAST LUMPECTOMY WITH RADIOACTIVE SEED AND LEFT AXILLARY SENTINEL LYMPH NODE BIOPSY (Left) - PEC BLOCK REMOVAL PORT-A-CATH (N/A)  SURGEON:  Surgeon(s) and Role:    * Rolm Bookbinder, MD - Primary  PHYSICIAN ASSISTANT:   ASSISTANTS: none   ANESTHESIA:   general with pec block  EBL: minimal  BLOOD ADMINISTERED:none  DRAINS: none   LOCAL MEDICATIONS USED:  MARCAINE     SPECIMEN:  Excision  DISPOSITION OF SPECIMEN:  PATHOLOGY  COUNTS:  YES  TOURNIQUET:  * No tourniquets in log *  DICTATION: .Dragon Dictation  PLAN OF CARE: Discharge to home after PACU  PATIENT DISPOSITION:  PACU - hemodynamically stable.   Delay start of Pharmacological VTE agent (>24hrs) due to surgical blood loss or risk of bleeding: not applicable

## 2019-12-09 NOTE — Interval H&P Note (Signed)
History and Physical Interval Note:  12/09/2019 8:16 AM  Shelby Anderson  has presented today for surgery, with the diagnosis of LEFT BREAST CANCER.  The various methods of treatment have been discussed with the patient and family. After consideration of risks, benefits and other options for treatment, the patient has consented to  Procedure(s) with comments: LEFT BREAST LUMPECTOMY WITH RADIOACTIVE SEED AND LEFT AXILLARY SENTINEL LYMPH NODE BIOPSY (Left) - PEC BLOCK REMOVAL PORT-A-CATH (N/A) as a surgical intervention.  The patient's history has been reviewed, patient examined, no change in status, stable for surgery.  I have reviewed the patient's chart and labs.  Questions were answered to the patient's satisfaction.     Rolm Bookbinder

## 2019-12-09 NOTE — Discharge Instructions (Signed)
[facility] Office Phone Number [phone]  BREAST BIOPSY/ PARTIAL MASTECTOMY: POST OP INSTRUCTIONS Take 400 mg of ibuprofen every 8 hours or 650 mg tylenol every 6 hours for next 72 hours then as needed. Use ice several times daily also. Always review your discharge instruction sheet given to you by the facility where your surgery was performed.  IF YOU HAVE DISABILITY OR FAMILY LEAVE FORMS, YOU MUST BRING THEM TO THE OFFICE FOR PROCESSING.  DO NOT GIVE THEM TO YOUR DOCTOR.  1. A prescription for pain medication may be given to you upon discharge.  Take your pain medication as prescribed, if needed.  If narcotic pain medicine is not needed, then you may take acetaminophen (Tylenol), naprosyn (Alleve) or ibuprofen (Advil) as needed. 2. Take your usually prescribed medications unless otherwise directed 3. If you need a refill on your pain medication, please contact your pharmacy.  They will contact our office to request authorization.  Prescriptions will not be filled after 5pm or on week-ends. 4. You should eat very light the first 24 hours after surgery, such as soup, crackers, pudding, etc.  Resume your normal diet the day after surgery. 5. Most patients will experience some swelling and bruising in the breast.  Ice packs and a good support bra will help.  Wear the breast binder provided or a sports bra for 72 hours day and night.  After that wear a sports bra during the day until you return to the office. Swelling and bruising can take several days to resolve.  6. It is common to experience some constipation if taking pain medication after surgery.  Increasing fluid intake and taking a stool softener will usually help or prevent this problem from occurring.  A mild laxative (Milk of Magnesia or Miralax) should be taken according to package directions if there are no bowel movements after 48 hours. 7. Unless discharge instructions indicate otherwise, you may remove your bandages 48  hours after surgery and you may shower at that time.  You may have steri-strips (small skin tapes) in place directly over the incision.  These strips should be left on the skin for 7-10 days and will come off on their own.  If your surgeon used skin glue on the incision, you may shower in 24 hours.  The glue will flake off over the next 2-3 weeks.  Any sutures or staples will be removed at the office during your follow-up visit. 8. ACTIVITIES:  You may resume regular daily activities (gradually increasing) beginning the next day.  Wearing a good support bra or sports bra minimizes pain and swelling.  You may have sexual intercourse when it is comfortable. a. You may drive when you no longer are taking prescription pain medication, you can comfortably wear a seatbelt, and you can safely maneuver your car and apply brakes. b. RETURN TO WORK:  ______________________________________________________________________________________ 9. You should see your doctor in the office for a follow-up appointment approximately two weeks after your surgery.  Your doctor's nurse will typically make your follow-up appointment when she calls you with your pathology report.  Expect your pathology report 3-4 business days after your surgery.  You may call to check if you do not hear from us after three days. 10. OTHER INSTRUCTIONS: _______________________________________________________________________________________________ _____________________________________________________________________________________________________________________________________ _____________________________________________________________________________________________________________________________________ _____________________________________________________________________________________________________________________________________  WHEN TO CALL DR Flemington Paone: 1. Fever over 101.0 2. Nausea and/or vomiting. 3. Extreme swelling or  bruising. 4. Continued bleeding from incision. 5. Increased pain, redness, or drainage from the incision.  The clinic   staff is available to answer your questions during regular business hours.  Please don't hesitate to call and ask to speak to one of the nurses for clinical concerns.  If you have a medical emergency, go to the nearest emergency room or call 911.  A surgeon from [facility] is always on call at the hospital.  For further questions, please visit [email or web address]  

## 2019-12-09 NOTE — Op Note (Signed)
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 Serita Grammes Anesthesia: General  Specimens: 1.Leftbreast tissue marked with paint, containing seed and clip 2. Additionalleft breast posterolateral margin marked short superior, long lateral, double deep 3.Leftdeep axillary nodes Estimated blood [id number] cc Complications: None Drains: None Sponge and count was correct at completion Disposition to recovery in stable condition  Indications: This is a77 yof who has clinical stage II tn left breast cancer. She had port placed and had two cycles of chemotherapy.  Unable to tolerate and we discussed proceeding with surgery. Oncology requested port removal as well as she is not candidate for further chemotherapy.    Procedure: After informed consent was obtained shewas placed under general anesthesia without complication.  She had undergone a pectoral block and injected with technetium in standard periareolar fashion. She was given antibiotics. SCDs were in place. She was then prepped and draped in the standard sterile surgical fashion. A surgical timeout was then performed.  I injected combination of methylene blue/saline mixture in periareolar fashion to find the sentinel node. I massaged this.   I then infiltrated marcaine in right chest  I reentered port incision and removed port and line in their entirety. I removed all suture material. Hemostasis observed. I then closed with 3-0 vicryl and 4-0 monocryl. Glue was placed.  I then identified the seed in theupper outer quadrant of the left breast.Imade a periareolar incision to hide the scar later.Iinfiltrated Marcaine.I then tunneled to the seed using the neoprobe for guidance.  I then removed the seed and the surrounding tissue with an attempt to get a clear margin. The seed and the clip were both on the specimen mammogram. I thought the seed was close in a couple margins so I removed some more. the posterior margin was the pectoralis. I then located the sentinel nodes in the low axilla.Ienteredthe axillary fascia. I then identified the sentinel node with a count of55. I excised this. There were several other nodes that I excised as well.  The background radioactivity was less than 10% and there were no palpable nodes present. There was no blue dye presentI then obtained hemostasis. I closed the fascia with 2-0 vicryl. I closed this with 2-0 vicryl 3-0 vicryl and 4-0 monocryl.  I mobilized the breast tissue and closed this with 2-0 Vicryl, 3-0 Vicryl, and5-0 Monocryl.glue and steristrips were applied. She had a binder placed. She was extubated and transferred to recovery stable

## 2019-12-09 NOTE — Anesthesia Postprocedure Evaluation (Signed)
Anesthesia Post Note  Patient: Shelby Anderson  Procedure(s) Performed: LEFT BREAST LUMPECTOMY WITH RADIOACTIVE SEED AND LEFT AXILLARY SENTINEL LYMPH NODE BIOPSY (Left ) REMOVAL PORT-A-CATH (N/A )     Patient location during evaluation: PACU Anesthesia Type: General Level of consciousness: awake and alert Pain management: pain level controlled Vital Signs Assessment: post-procedure vital signs reviewed and stable Respiratory status: spontaneous breathing, nonlabored ventilation and respiratory function stable Cardiovascular status: blood pressure returned to baseline and stable Postop Assessment: no apparent nausea or vomiting Anesthetic complications: no    Last Vitals:  Vitals:   12/09/19 1100 12/09/19 1115  BP: 128/77 118/63  Pulse: (!) 56 (!) 56  Resp: 17 (!) 29  Temp:  (!) 36.2 C  SpO2: 97% 96%    Last Pain:  Vitals:   12/09/19 0716  TempSrc:   PainSc: 6                  Lidia Collum

## 2019-12-09 NOTE — Transfer of Care (Signed)
Immediate Anesthesia Transfer of Care Note  Patient: Shelby Anderson  Procedure(s) Performed: LEFT BREAST LUMPECTOMY WITH RADIOACTIVE SEED AND LEFT AXILLARY SENTINEL LYMPH NODE BIOPSY (Left ) REMOVAL PORT-A-CATH (N/A )   Patient Location: PACU  Anesthesia Type:GA combined with regional for post-op pain  Level of Consciousness: awake, alert  and oriented  Airway & Oxygen Therapy: Patient Spontanous Breathing and Patient connected to face mask oxygen  Post-op Assessment: Report given to RN and Post -op Vital signs reviewed and stable  Post vital signs: Reviewed and stable  Last Vitals:  Vitals Value Taken Time  BP 137/74 12/09/19 1015  Temp    Pulse 64 12/09/19 1017  Resp 10 12/09/19 1017  SpO2 100 % 12/09/19 1017  Vitals shown include unvalidated device data.  Last Pain:  Vitals:   12/09/19 0716  TempSrc:   PainSc: 6          Complications: No apparent anesthesia complications

## 2019-12-09 NOTE — H&P (Signed)
Shelby Anderson is an 64 y.o. female.   Chief Complaint: left breast cancer HPI: 48 yof who was diagnosed on screening mm with 2.1 cm left breast mass, no ax lad. Biopsy was idc with dcis, grade 3, tn with high Ki. She began primary chemotherapy which put her in hospital twice. Cannot tolerate now.  She is better off oxygen at this point. Cant get mr due to loop recorder.   We repeated mm/us and there is now a 1.5 cm mass with no other findings.  She is due for surgery now and can have her port removed. She is much better and is off oxygen.    Past Medical History:  Diagnosis Date  . Allergy   . Anemia    "chronic"  . Angina    Prinzmetal angina  . Anxiety and depression   . Arthritis   . Asthma   . Atrial fibrillation ([place])    h/o "AF w/frequent PVCs"  . Breast cancer ([place])    Left  . Cancer (HCC)    hx of skin cancer   . CHF (congestive heart failure) ([place])   . Chronic back pain greater than 3 months duration    on chronic narcotics, treated at pain clinic  . Colon polyps    hyperplastic  . Coronary artery disease    Arrythmia, orthostatic hypotension, HLD, HTN; sees Dr. Einar Gip  . Difficult intubation    "TMJ & woke up when they were still cutting on me"  . Dysrhythmia    sees Dr. Einar Gip and a cardiologist at [facility]  . Esophageal stricture   . Family history of melanoma ]  . Family history of pancreatic cancer   . Fatty liver   . Fatty liver   . Fibroids   . Fibromyalgia    "in my legs"  . GERD (gastroesophageal reflux disease)    hx hiatal hernia, stricture and gastric ulcer  . Headache(784.0)    migraines  . Heart murmur   . Hiatal hernia   . History of loop recorder   . History of migraines    "dx'd when I was in my teens"  . Hyperlipemia   . Hypertension   . Mental disorder   . Mild episode of recurrent major depressive disorder ([place]) 12/06/2015  . Myocardial infarction [facility]) 1980's & 1990;   sees Dr. Einar Gip  . OSA (obstructive sleep apnea)   .  Pneumonia    multiple times  . PONV (postoperative nausea and vomiting)   . Recurrent upper respiratory infection (URI)   . Shortness of breath 11/20/11   "all the time", sees pulmonlogy, ? asthma  . Stenotic cervical os   . Stomach ulcer    "3 small; found in 05/2011"  . TMJ (dislocation of temporomandibular joint)   . Tuberculosis    + TB SKIN TEST  . Type 2 diabetes mellitus without complication, without long-term current use of insulin ([place]) 12/06/2015   not on meds     Past Surgical History:  Procedure Laterality Date  . ACHILLES TENDON REPAIR  1970's   left ankle  . ARTHROSCOPIC REPAIR ACL     left knee cap  . BREAST BIOPSY Right 04/08/2013   again in October or November 2020  . CARDIAC CATHETERIZATION     loop recorder  . CARPAL TUNNEL RELEASE  unknown   left hand  . COLONOSCOPY    . ESOPHAGOGASTRODUODENOSCOPY (EGD) WITH PROPOFOL N/A 10/29/2017   Procedure: ESOPHAGOGASTRODUODENOSCOPY (EGD) WITH PROPOFOL;  Surgeon: Irene Shipper, MD;  Location: [facility];  Service: Endoscopy;  Laterality: N/A;  . LOOP RECORDER IMPLANT    . PORTACATH PLACEMENT N/A 09/23/2019   Procedure: INSERTION PORT-A-CATH Right Internal Doreen Salvage;  Surgeon: Rolm Bookbinder, MD;  Location: [facility];  Service: General;  Laterality: N/A;  . post ganglionectomy  1970's   "for migraine headaches"  . pouch string  [phone]   "did this 3 times (once w/each pregnancy)"  . SAVORY DILATION N/A 10/29/2017   Procedure: SAVORY DILATION;  Surgeon: Irene Shipper, MD;  Location: [facility];  Service: Endoscopy;  Laterality: N/A;  . TOTAL KNEE ARTHROPLASTY Left 09/25/2016   Procedure: LEFT TOTAL KNEE ARTHROPLASTY;  Surgeon: Paralee Cancel, MD;  Location: [facility];  Service: Orthopedics;  Laterality: Left;  . TUBAL LIGATION  1980's    Family History  Problem Relation Age of Onset  . Malignant hyperthermia Father   . Hypertension Father   . Heart disease Father   . Diabetes Father   . Cancer Father         skin  . Hypertension Mother   . Heart disease Mother   . Multiple myeloma Mother   . Cancer Sister        CERVICAL  . Hypertension Sister   . Cancer Brother 22       MELANOMA  . Heart disease Maternal Grandmother   . Other Maternal Grandmother 32       complications of childbirth  . Heart disease Maternal Grandfather   . Cancer Paternal Grandmother        ?   Marland Kitchen Heart disease Paternal Grandmother   . Heart disease Paternal Grandfather   . Cancer Brother        LUNG  . Diabetes Sister   . Hypertension Sister   . Heart disease Sister   . Cancer Sister   . Cancer Brother   . Pancreatic cancer Niece 38  . Cancer Nephew 40       unknown- currently in the [employer]  . Anesthesia problems Neg Hx   . Hypotension Neg Hx   . Pseudochol deficiency Neg Hx   . Colon cancer Neg Hx   . Esophageal cancer Neg Hx   . Rectal cancer Neg Hx   . Stomach cancer Neg Hx   . Breast cancer Neg Hx    Social History:  reports that she has never smoked. She has never used smokeless tobacco. She reports that she does not drink alcohol or use drugs.  Allergies:  Allergies  Allergen Reactions  . Lodine [Etodolac] Anaphylaxis, Hives and Swelling  . Oxycontin [Oxycodone Hcl] Anaphylaxis    hives, trouble breathing, tongue swelling (Only Oxycontin) Tolerates plain oxycodone.  Marland Kitchen Penicillins Anaphylaxis    Told by a surgeon never to take it again. Has patient had a PCN reaction causing immediate rash, facial/tongue/throat swelling, SOB or lightheadedness with hypotension: Yes Has patient had a PCN reaction causing severe rash involving mucus membranes or skin necrosis: Unknown Has patient had a PCN reaction that required hospitalization: No Has patient had a PCN reaction occurring within the last 10 years: No If all of the above answers are "NO", then may proceed with Cephalosporin use.  . Aspirin Other (See Comments)    High-dose caused GI Bleeds  . Darvocet [Propoxyphene N-Acetaminophen] Hives   . Nitroglycerin Other (See Comments)    IV-BP drops dramatically Can take po  . Tramadol Hives and Itching  . Ultram [Tramadol Hcl] Hives  .  Valium Other (See Comments)    Circulation problems. "Legs turned black".    Medications Prior to Admission  Medication Sig Dispense Refill  . albuterol (VENTOLIN HFA) 108 (90 Base) MCG/ACT inhaler Inhale 2 puffs into the lungs every 6 (six) hours as needed for wheezing or shortness of breath. 3 g 3  . aspirin EC 81 MG tablet Take 81 mg at bedtime by mouth.     Marland Kitchen atorvastatin (LIPITOR) 40 MG tablet TAKE 1 TABLET BY MOUTH EVERY DAY (Patient taking differently: Take 40 mg by mouth at bedtime. ) 90 tablet 0  . Blood Glucose Monitoring Suppl (ONE TOUCH ULTRA 2) w/Device KIT USE TO CHECK BLOOD SUGAR 2 TIMES A DAY 1 kit 0  . budesonide-formoterol (SYMBICORT) 80-4.5 MCG/ACT inhaler INHALE 2 PUFFS INTO THE LUNGS EVERY MORNING AND ANOTHER 2 PUFFS 12 HOURS LATER (Patient taking differently: Inhale 2 puffs into the lungs 2 (two) times daily. ) 30.6 Inhaler 1  . Calcium Carbonate-Vitamin D 600-200 MG-UNIT TABS Take 1 tablet by mouth daily.    Marland Kitchen CINNAMON PO Take 1,000 mg 2 (two) times daily by mouth.    . clobetasol cream (TEMOVATE) 0.05 % Apply topically 2 (two) times daily. (Patient taking differently: Apply topically daily as needed (rash). ) 30 g 0  . doxepin (SINEQUAN) 10 MG capsule Take 20 mg by mouth at bedtime.     Marland Kitchen esomeprazole (NEXIUM) 40 MG capsule Take 1 capsule (40 mg total) by mouth 2 (two) times daily. (Patient taking differently: Take 80 mg by mouth 2 (two) times daily. ) 180 capsule 3  . famotidine (PEPCID) 20 MG tablet Take 1 tablet (20 mg total) by mouth 2 (two) times daily as needed for heartburn or indigestion. 60 tablet 1  . furosemide (LASIX) 20 MG tablet 1-2 daily as directed. (Patient taking differently: Take 20-40 mg by mouth See admin instructions. Take 65m daily and may take another 238mwhen directed from doctor. Shortness of breath) 60  tablet 1  . glipiZIDE (GLUCOTROL XL) 5 MG 24 hr tablet TAKE 1 TABLET BY MOUTH EVERY DAY WITH BREAKFAST (Patient taking differently: Take 5 mg by mouth daily with breakfast. ) 90 tablet 3  . L-Methylfolate (DEPLIN) 7.5 MG TABS Take 7.5 mg by mouth daily with breakfast.     . Lancets (ONETOUCH DELICA PLUS LAANVBTY60AMISC USE TO CHECK BLOOD SUGAR 2 TIMES A DAY 100 each 3  . LORazepam (ATIVAN) 0.5 MG tablet Take 1 tablet (0.5 mg total) by mouth at bedtime as needed for sleep. 30 tablet 0  . LYRICA 150 MG capsule Take 150 mg by mouth 3 (three) times daily.   2  . metFORMIN (GLUCOPHAGE) 500 MG tablet TAKE 1 TABLET (500 MG TOTAL) BY MOUTH 2 (TWO) TIMES DAILY WITH A MEAL. 180 tablet 1  . Misc Natural Products (GLUCOS-CHONDROIT-MSM COMPLEX PO) Take 1 tablet 3 (three) times daily by mouth.     . mometasone (ELOCON) 0.1 % cream Apply 1 application topically daily as needed (irritation).     . montelukast (SINGULAIR) 10 MG tablet TAKE 1 TABLET BY MOUTH EVERYDAY AT BEDTIME (Patient taking differently: Take 10 mg by mouth at bedtime. ) 90 tablet 1  . Multiple Vitamin (MULITIVITAMIN WITH MINERALS) TABS Take 1 tablet by mouth daily.      . nitroGLYCERIN (NITROSTAT) 0.4 MG SL tablet Place 0.4 mg under the tongue every 5 (five) minutes as needed for chest pain.     . Marland Kitchenndansetron (ZOFRAN) 4 MG tablet Take 4  mg by mouth every 8 (eight) hours as needed for nausea or vomiting.    . ondansetron (ZOFRAN) 8 MG tablet Take 1 tablet (8 mg total) by mouth 2 (two) times daily as needed for refractory nausea / vomiting. Start on day 3 after chemo. 30 tablet 1  . ONETOUCH ULTRA test strip USE TO CHECK BLOOD SUGAR 2 TIMES A DAY 100 strip 1  . Oxycodone HCl 20 MG TABS Take 20 mg every 4 (four) hours as needed by mouth (pain).     . OXYGEN Inhale 2 L into the lungs at bedtime as needed (shortness of breath).     . potassium chloride (K-DUR) 10 MEQ tablet TAKE 2 TABLETS BY MOUTH EVERY DAY (Patient taking differently: Take 20 mEq by  mouth daily. ) 180 tablet 1  . promethazine (PHENERGAN) 25 MG tablet Take 1 tablet (25 mg total) by mouth every 6 (six) hours as needed for nausea or vomiting. (Patient taking differently: Take 25 mg by mouth at bedtime. Additional as needed) 90 tablet 3  . sucralfate (CARAFATE) 1 g tablet Take 1 tablet (1 g total) by mouth 4 (four) times daily -  with meals and at bedtime. Before taking slowly dissolve tablet in 1 Tablespoon of distilled water for about 15 minutes to creat a slurry. (Patient taking differently: Take 1 g by mouth 4 (four) times daily. Before taking slowly dissolve tablet in 1 Tablespoon of distilled water for about 15 minutes to creat a slurry.) 360 tablet 3  . tiZANidine (ZANAFLEX) 4 MG tablet Take 4 mg by mouth every 8 (eight) hours as needed for muscle spasms.    . vitamin B-12 (CYANOCOBALAMIN) 500 MCG tablet Take 500 mcg by mouth daily.     Marland Kitchen albuterol (PROVENTIL) (2.5 MG/3ML) 0.083% nebulizer solution Take 3 mLs (2.5 mg total) by nebulization every 6 (six) hours as needed for wheezing or shortness of breath (J45.40). (Patient taking differently: Take 2.5 mg by nebulization every 6 (six) hours as needed for wheezing or shortness of breath. ) 75 mL 3  . fluticasone (FLONASE) 50 MCG/ACT nasal spray Place 2 sprays at bedtime as needed into both nostrils for allergies or rhinitis.     Marland Kitchen HYDROcodone-acetaminophen (NORCO) 10-325 MG tablet Take 1 tablet by mouth every 6 (six) hours as needed. (Patient not taking: Reported on 12/02/2019) 6 tablet 0  . predniSONE (DELTASONE) 10 MG tablet Start with 40 mg daily and decrease by 10 mg every 5 days. (Patient not taking: Reported on 12/02/2019) 50 tablet 0  . prochlorperazine (COMPAZINE) 10 MG tablet Take 1 tablet (10 mg total) by mouth every 6 (six) hours as needed (Nausea or vomiting). 30 tablet 1    Results for orders placed or performed during the hospital encounter of 12/08/19 (from the past 48 hour(s))  Glucose, capillary     Status:  Abnormal   Collection Time: 12/08/19  9:05 AM  Result Value Ref Range   Glucose-Capillary 163 (H) 70 - 99 mg/dL  Comprehensive metabolic panel     Status: Abnormal   Collection Time: 12/08/19 10:25 AM  Result Value Ref Range   Sodium 143 135 - 145 mmol/L   Potassium 3.7 3.5 - 5.1 mmol/L   Chloride 102 98 - 111 mmol/L   CO2 27 22 - 32 mmol/L   Glucose, Bld 150 (H) 70 - 99 mg/dL   BUN 6 (L) 8 - 23 mg/dL   Creatinine, Ser 0.60 0.44 - 1.00 mg/dL   Calcium 9.2 8.9 -  10.3 mg/dL   Total Protein 6.6 6.5 - 8.1 g/dL   Albumin 3.3 (L) 3.5 - 5.0 g/dL   AST 28 15 - 41 U/L   ALT 29 0 - 44 U/L   Alkaline Phosphatase 63 38 - 126 U/L   Total Bilirubin 0.3 0.3 - 1.2 mg/dL   GFR calc non Af Amer >60 >60 mL/min   GFR calc Af Amer >60 >60 mL/min   Anion gap 14 5 - 15    Comment: Performed at [facility] [address], [place] [address]  CBC     Status: None   Collection Time: 12/08/19 10:25 AM  Result Value Ref Range   WBC 5.7 4.0 - 10.5 K/uL   RBC 4.25 3.87 - 5.11 MIL/uL   Hemoglobin 12.5 12.0 - 15.0 g/dL   HCT 40.9 36.0 - 46.0 %   MCV 96.2 80.0 - 100.0 fL   MCH 29.4 26.0 - 34.0 pg   MCHC 30.6 30.0 - 36.0 g/dL   RDW 14.4 11.5 - 15.5 %   Platelets 265 150 - 400 K/uL   nRBC 0.0 0.0 - 0.2 %    Comment: Performed at [facility], [place] [address], [place], [place] [address]   MM LT RADIOACTIVE SEED LOC MAMMO GUIDE  Result Date: 12/08/2019 CLINICAL DATA:  Patient presents for seed localization prior to LEFT lumpectomy for recently diagnosed invasive ductal carcinoma. Patient has undergone neoadjuvant treatment. EXAM: MAMMOGRAPHIC GUIDED RADIOACTIVE SEED LOCALIZATION OF THE LEFT BREAST COMPARISON:  Previous exam(s). FINDINGS: Patient presents for radioactive seed localization prior to lumpectomy. I met with the patient and we discussed the procedure of seed localization including benefits and alternatives. We discussed the high likelihood of a successful procedure. We discussed  the risks of the procedure including infection, bleeding, tissue injury and further surgery. We discussed the low dose of radioactivity involved in the procedure. Informed, written consent was given. The usual time-out protocol was performed immediately prior to the procedure. Using mammographic guidance, sterile technique, 1% lidocaine and an I-125 radioactive seed, the ribbon shaped clip in the UPPER-OUTER QUADRANT of the LEFT breast was localized using a craniocaudal approach. The follow-up mammogram images confirm the seed in the expected location and were marked for Dr. Donne Hazel. Follow-up survey of the patient confirms presence of the radioactive seed. Order number of I-125 seed:  [id number]. Total activity:  6.314 millicuries reference Date: 11/02/2019 The patient tolerated the procedure well and was released from the [facility]. She was given instructions regarding seed removal. IMPRESSION: Radioactive seed localization left breast. No apparent complications. Electronically Signed   By: Nolon Nations M.D.   On: 12/08/2019 15:03    Review of Systems  All other systems reviewed and are negative.   Blood pressure 135/74, pulse 72, temperature 98.7 F (37.1 C), temperature source Oral, resp. rate 17, height '5\' 6"'  (1.676 m), weight 105 kg, SpO2 98 %. Physical Exam  cv rrr Lungs clear No breast mass, no adenopathy  Assessment/Plan Left breast seed guided lumpectomy, left ax sn biopsy, port removal We discussed the staging and pathophysiology of breast cancer. We discussed all of the different options for treatment for breast cancer including surgery, chemotherapy, radiation therapy, Herceptin, and antiestrogen therapy. We discussed a sentinel lymph node biopsy as she does not appear to having lymph node involvement right now. We discussed the performance of that with injection of radioactive tracer. We discussed that there is a chance of having a positive node  with a sentinel lymph node  biopsy and we will await the permanent pathology to make any other first further decisions in terms of her treatment. We discussed up to a 5% risk lifetime of chronic shoulder pain as well as lymphedema associated with a sentinel lymph node biopsy. We discussed the options for treatment of the breast cancer which included lumpectomy versus a mastectomy. We discussed the performance of the lumpectomy with radioactive seed placement. We discussed a 5-10% chance of a positive margin requiring reexcision in the operating room. We also discussed that she will likely need radiation therapy if she undergoes lumpectomy. The breast cannot undergo more radiation therapy in the same breast after lumpectomy in the future. We discussed mastectomy and the postoperative care for that as well. Mastectomy can be followed by reconstruction. The decision for lumpectomy vs mastectomy has no impact on decision for chemotherapy. Most mastectomy patients will not need radiation therapy. We discussed that there is no difference in her survival whether she undergoes lumpectomy with radiation therapy or antiestrogen therapy versus a mastectomy. There is also no real difference between her recurrence in the breast. We discussed the risks of operation including bleeding, infection, possible reoperation. She understands her further therapy will be based on what her stages at the time of her operation.  Rolm Bookbinder, MD 12/09/2019, 8:12 AM

## 2019-12-09 NOTE — Anesthesia Procedure Notes (Signed)
Anesthesia Regional Block: Pectoralis block   Pre-Anesthetic Checklist: ,, timeout performed, Correct Patient, Correct Site, Correct Laterality, Correct Procedure, Correct Position, site marked, Risks and benefits discussed,  Surgical consent,  Pre-op evaluation,  At surgeon's request and post-op pain management  Laterality: Left  Prep: chloraprep       Needles:  Injection technique: Single-shot  Needle Type: Echogenic Stimulator Needle     Needle Length: 10cm  Needle Gauge: 21     Additional Needles:   Procedures:,,,, ultrasound used (permanent image in chart),,,,  Narrative:  Start time: 12/09/2019 8:10 AM End time: 12/09/2019 8:18 AM Injection made incrementally with aspirations every 5 mL.  Performed by: Personally  Anesthesiologist: Lidia Collum, MD  Additional Notes: Monitors applied. Injection made in 5cc increments. No resistance to injection. Good needle visualization. Patient tolerated procedure well.

## 2019-12-09 NOTE — Anesthesia Procedure Notes (Signed)
Procedure Name: LMA Insertion Date/Time: 12/09/2019 8:50 AM Performed by: Alain Marion, CRNA Pre-anesthesia Checklist: Patient identified, Emergency Drugs available, Suction available and Patient being monitored Patient Re-evaluated:Patient Re-evaluated prior to induction Oxygen Delivery Method: Circle System Utilized Preoxygenation: Pre-oxygenation with 100% oxygen Induction Type: IV induction Ventilation: Mask ventilation without difficulty LMA: LMA inserted LMA Size: 4.0 Number of attempts: 1 Airway Equipment and Method: Bite block Placement Confirmation: positive ETCO2 Tube secured with: Tape Dental Injury: Teeth and Oropharynx as per pre-operative assessment

## 2019-12-10 ENCOUNTER — Other Ambulatory Visit: Payer: Self-pay

## 2019-12-10 MED ORDER — FAMOTIDINE 20 MG PO TABS: 20.0000 mg | ORAL_TABLET | Freq: Two times a day (BID) | ORAL | 3 refills | Status: DC | PRN

## 2019-12-12 ENCOUNTER — Other Ambulatory Visit: Payer: Self-pay | Admitting: Family Medicine

## 2019-12-14 ENCOUNTER — Other Ambulatory Visit: Payer: Self-pay | Admitting: Adult Health

## 2019-12-14 ENCOUNTER — Encounter: Payer: Self-pay | Admitting: *Deleted

## 2019-12-14 DIAGNOSIS — M47816 Spondylosis without myelopathy or radiculopathy, lumbar region: Secondary | ICD-10-CM | POA: Diagnosis not present

## 2019-12-14 DIAGNOSIS — S9422XS Injury of deep peroneal nerve at ankle and foot level, left leg, sequela: Secondary | ICD-10-CM | POA: Diagnosis not present

## 2019-12-14 DIAGNOSIS — G894 Chronic pain syndrome: Secondary | ICD-10-CM | POA: Diagnosis not present

## 2019-12-14 DIAGNOSIS — Z79891 Long term (current) use of opiate analgesic: Secondary | ICD-10-CM | POA: Diagnosis not present

## 2019-12-14 LAB — SURGICAL PATHOLOGY

## 2019-12-14 NOTE — Telephone Encounter (Signed)
Shelby Anderson, please advise if it is okay to refill med for pt?

## 2019-12-15 ENCOUNTER — Other Ambulatory Visit: Payer: Self-pay | Admitting: *Deleted

## 2019-12-15 NOTE — Patient Outreach (Signed)
[facility]) Care Management  12/15/2019  Shelby Anderson 19-Nov-1956 [id number]   Call placed to member to follow up on lumpectomy.  She report she is doing "ok" but is still having pain.  She was prescribed Vicodin but report it does not help as much as her what she was taking prior to surgery (Oxycodone and Lyrica).  When she take these she report pain is decreased but not relieved.  She is also using her pillow to brace her incision site when moving to help decrease risk of pain.  She has appointment with oncologist this week and with radiology oncologist next week to discuss schedule for radiation.    She has been managing her chronic medical conditions well but report her blood sugars are slightly elevated since being on Prednisone.  Report range from 118-200s, but mainly 150s.  She has appointment with PCP within the next 2 weeks and will discuss concern for elevated blood sugars.  State her weights have been stable, ranging 228-230 pounds.  Denies any urgent concerns, advised to contact this care manager with questions.  Will follow up within the next month.  [facility] CM Care Plan Problem One     Most Recent Value  Care Plan Problem One  Risk for readmission related to complications of cancer treatment as evidenced by recent hospitalization  Role Documenting the Problem One  Care Management [place] for Problem One  Not Active  [facility] Long Term Goal   Member will not be readmitted to hospital within the next 31 days  [facility] Long Term Goal Start Date  11/04/19  [facility] Long Term Goal Met Date  12/15/19  [facility] CM Short Term Goal #2   Member will be able to verbalize plan for cancer treatment within the next 4 weeks  [facility] CM Short Term Goal #2 Start Date  11/04/19  [facility] CM Short Term Goal #2 Met Date  12/15/19    [facility] CM Care Plan Problem Two     Most Recent Value  Care Plan Problem Two  Knowledge deficit regarding new diagnosis of breast cancer  Role Documenting the  Problem Two  Care Management [place] for Problem Two  Active  [facility] for Problem Two Long Term Goal   Reviewed plan for radiation therapy post lumpectomy.  She will discuss specific dates of treatments with MD during next appointment.  [place] Term Goal  Member will report no complications of new cancer treatment plan over the next 45 days  [facility] Long Term Goal Start Date  12/15/19  [facility] CM Short Term Goal #1   Member will keepand attend appointments with oncologist and radiology oncologist within the next 2 weeks  [facility] CM Short Term Goal #1 Start Date  12/15/19  Inter[facility] for Short Term Goal #2   Reviewed upcoming appointments with member, assessed need for transportation resources, none needed  [facility] CM Short Term Goal #2   Member will report decreased pain at the surgical site within the next 2 weeks  [facility] CM Short Term Goal #2 Start Date  12/15/19  Inter[facility] for Short Term Goal #2  Interventions for pain relief discussed with member     Valente David, RN, MSN [place] Manager [phone]

## 2019-12-17 NOTE — Progress Notes (Signed)
Patient Care Team: Caren Macadam, MD as PCP - General (Family Medicine) Rigoberto Noel, MD as Consulting Physician (Pulmonary Disease) Adrian Prows, MD as Consulting Physician (Cardiology) Lenon Oms, MD as Referring Physician (Obstetrics and Gynecology) Veneda Melter, MD as Referring Physician (Cardiology) [name], MD as Consulting Physician (Anesthesiology) Terrance Mass, MD (Inactive) as Consulting Physician (Gynecology) Rockwell Germany, RN as Oncology Nurse Navigator Mauro Kaufmann, RN as Oncology Nurse Navigator Rolm Bookbinder, MD as Consulting Physician (General Surgery) Nicholas Lose, MD as Consulting Physician (Hematology and Oncology) Kyung Rudd, MD as Consulting Physician (Radiation Oncology) Alda Berthold, DO as Consulting Physician (Neurology) Jarome Matin, MD as Consulting Physician (Dermatology) Valente David, RN as [facility]  DIAGNOSIS:    ICD-10-CM   1. Malignant neoplasm of upper-outer quadrant of right breast in female, estrogen receptor negative ([place])  C50.411    Z17.1     SUMMARY OF ONCOLOGIC HISTORY: Oncology History  Malignant neoplasm of upper-outer quadrant of right breast in female, estrogen receptor negative ([place])  09/07/2019 Initial Diagnosis   Routine screening mammogram detected a 2.1cm mass in the left breast and no left axillary adenopathy. 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   09/24/2019 -  Chemotherapy   The patient had palonosetron (ALOXI) injection 0.25 mg, 0.25 mg, Intravenous,  Once, 2 of 6 cycles Administration: 0.25 mg (09/24/2019), 0.25 mg (10/15/2019) pegfilgrastim (NEULASTA ONPRO KIT) injection 6 mg, 6 mg, Subcutaneous, Once, 2 of 6 cycles Administration: 6 mg (09/24/2019), 6 mg (10/15/2019) cyclophosphamide (CYTOXAN) 1,100 mg in sodium chloride 0.9 % 250 mL chemo infusion, 500 mg/m2 = 1,100 mg (83.3 % of original dose 600 mg/m2),  Intravenous,  Once, 2 of 6 cycles Dose modification: 500 mg/m2 (original dose 600 mg/m2, Cycle 1, Reason: Provider Judgment) Administration: 1,100 mg (09/24/2019), 1,100 mg (10/15/2019) DOCEtaxel (TAXOTERE) 140 mg in sodium chloride 0.9 % 250 mL chemo infusion, 65 mg/m2 = 140 mg (86.7 % of original dose 75 mg/m2), Intravenous,  Once, 2 of 6 cycles Dose modification: 65 mg/m2 (original dose 75 mg/m2, Cycle 1, Reason: Provider Judgment) Administration: 140 mg (09/24/2019), 140 mg (10/15/2019) fosaprepitant (EMEND) 150 mg in sodium chloride 0.9 % 145 mL IVPB, , Intravenous,  Once, 2 of 6 cycles Administration:  (09/24/2019),  (10/15/2019)  for chemotherapy treatment.    12/09/2019 Surgery   Left lumpectomy Donne Shelby): microscopic focus of residual IDC, grade 3, with high grade DCIS, clear margins.      CHIEF COMPLIANT: Follow-up s/p lumpectomy to review pathology  INTERVAL HISTORY: KALIKA SMAY is a 64 y.o. 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 Neoadjuvant chemotherapy with Taxotere and Cytoxan was discontinued due to recurrent respiratory exacerbations. She underwent a lumpectomy on 12/09/19 with Dr. Donne Shelby for which pathology showed microscopic focus of residual invasive ductal carcinoma, grade 3, with high grade DCIS, clear margins. She presents to the clinic today to review the pathology report and discuss further treatment.  She is experiencing a lot of pain related to recent surgery.  ALLERGIES:  is allergic to lodine [etodolac]; oxycontin [oxycodone hcl]; penicillins; aspirin; darvocet [propoxyphene n-acetaminophen]; nitroglycerin; tramadol; ultram [tramadol hcl]; and valium.  MEDICATIONS:  Current Outpatient Medications  Medication Sig Dispense Refill  . albuterol (PROVENTIL) (2.5 MG/3ML) 0.083% nebulizer solution Take 3 mLs (2.5 mg total) by nebulization every 6 (six) hours as needed for wheezing or shortness of breath (J45.40). (Patient taking  differently: Take 2.5 mg  by nebulization every 6 (six) hours as needed for wheezing or shortness of breath. ) 75 mL 3  . albuterol (VENTOLIN HFA) 108 (90 Base) MCG/ACT inhaler Inhale 2 puffs into the lungs every 6 (six) hours as needed for wheezing or shortness of breath. 3 g 3  . aspirin EC 81 MG tablet Take 81 mg at bedtime by mouth.     Marland Kitchen atorvastatin (LIPITOR) 40 MG tablet Take 1 tablet (40 mg total) by mouth at bedtime. 90 tablet 1  . Blood Glucose Monitoring Suppl (ONE TOUCH ULTRA 2) w/Device KIT USE TO CHECK BLOOD SUGAR 2 TIMES A DAY 1 kit 0  . budesonide-formoterol (SYMBICORT) 80-4.5 MCG/ACT inhaler INHALE 2 PUFFS INTO THE LUNGS EVERY MORNING AND ANOTHER 2 PUFFS 12 HOURS LATER (Patient taking differently: Inhale 2 puffs into the lungs 2 (two) times daily. ) 30.6 Inhaler 1  . Calcium Carbonate-Vitamin D 600-200 MG-UNIT TABS Take 1 tablet by mouth daily.    Marland Kitchen CINNAMON PO Take 1,000 mg 2 (two) times daily by mouth.    . clobetasol cream (TEMOVATE) 0.05 % Apply topically 2 (two) times daily. (Patient taking differently: Apply topically daily as needed (rash). ) 30 g 0  . doxepin (SINEQUAN) 10 MG capsule Take 20 mg by mouth at bedtime.     Marland Kitchen esomeprazole (NEXIUM) 40 MG capsule Take 1 capsule (40 mg total) by mouth 2 (two) times daily. (Patient taking differently: Take 80 mg by mouth 2 (two) times daily. ) 180 capsule 3  . famotidine (PEPCID) 20 MG tablet Take 1 tablet (20 mg total) by mouth 2 (two) times daily as needed for heartburn or indigestion. 60 tablet 3  . fluticasone (FLONASE) 50 MCG/ACT nasal spray Place 2 sprays at bedtime as needed into both nostrils for allergies or rhinitis.     . furosemide (LASIX) 20 MG tablet TAKE 1-2 TABLETS BY MOUTH DAILY AS DIRECTED. 60 tablet 0  . glipiZIDE (GLUCOTROL XL) 5 MG 24 hr tablet TAKE 1 TABLET BY MOUTH EVERY DAY WITH BREAKFAST (Patient taking differently: Take 5 mg by mouth daily with breakfast. ) 90 tablet 3  . HYDROcodone-acetaminophen (NORCO)  10-325 MG tablet Take 1 tablet by mouth every 6 (six) hours as needed. 10 tablet 0  . L-Methylfolate (DEPLIN) 7.5 MG TABS Take 7.5 mg by mouth daily with breakfast.     . Lancets (ONETOUCH DELICA PLUS [id number]) MISC USE TO CHECK BLOOD SUGAR 2 TIMES A DAY 100 each 3  . LORazepam (ATIVAN) 0.5 MG tablet Take 1 tablet (0.5 mg total) by mouth at bedtime as needed for sleep. 30 tablet 0  . LYRICA 150 MG capsule Take 150 mg by mouth 3 (three) times daily.   2  . metFORMIN (GLUCOPHAGE) 500 MG tablet TAKE 1 TABLET (500 MG TOTAL) BY MOUTH 2 (TWO) TIMES DAILY WITH A MEAL. 180 tablet 1  . Misc Natural Products (GLUCOS-CHONDROIT-MSM COMPLEX PO) Take 1 tablet 3 (three) times daily by mouth.     . mometasone (ELOCON) 0.1 % cream Apply 1 application topically daily as needed (irritation).     . montelukast (SINGULAIR) 10 MG tablet TAKE 1 TABLET BY MOUTH EVERYDAY AT BEDTIME (Patient taking differently: Take 10 mg by mouth at bedtime. ) 90 tablet 1  . Multiple Vitamin (MULITIVITAMIN WITH MINERALS) TABS Take 1 tablet by mouth daily.      . nitroGLYCERIN (NITROSTAT) 0.4 MG SL tablet Place 0.4 mg under the tongue every 5 (five) minutes as needed for chest pain.     Marland Kitchen  ondansetron (ZOFRAN) 4 MG tablet Take 4 mg by mouth every 8 (eight) hours as needed for nausea or vomiting.    . ondansetron (ZOFRAN) 8 MG tablet Take 1 tablet (8 mg total) by mouth 2 (two) times daily as needed for refractory nausea / vomiting. Start on day 3 after chemo. 30 tablet 1  . ONETOUCH ULTRA test strip USE TO CHECK BLOOD SUGAR 2 TIMES A DAY 100 strip 1  . Oxycodone HCl 20 MG TABS Take 20 mg every 4 (four) hours as needed by mouth (pain).     . OXYGEN Inhale 2 L into the lungs at bedtime as needed (shortness of breath).     . potassium chloride (K-DUR) 10 MEQ tablet TAKE 2 TABLETS BY MOUTH EVERY DAY (Patient taking differently: Take 20 mEq by mouth daily. ) 180 tablet 1  . prochlorperazine (COMPAZINE) 10 MG tablet Take 1 tablet (10 mg total) by  mouth every 6 (six) hours as needed (Nausea or vomiting). 30 tablet 1  . promethazine (PHENERGAN) 25 MG tablet Take 1 tablet (25 mg total) by mouth every 6 (six) hours as needed for nausea or vomiting. (Patient taking differently: Take 25 mg by mouth at bedtime. Additional as needed) 90 tablet 3  . sucralfate (CARAFATE) 1 g tablet Take 1 tablet (1 g total) by mouth 4 (four) times daily -  with meals and at bedtime. Before taking slowly dissolve tablet in 1 Tablespoon of distilled water for about 15 minutes to creat a slurry. (Patient taking differently: Take 1 g by mouth 4 (four) times daily. Before taking slowly dissolve tablet in 1 Tablespoon of distilled water for about 15 minutes to creat a slurry.) 360 tablet 3  . tiZANidine (ZANAFLEX) 4 MG tablet Take 4 mg by mouth every 8 (eight) hours as needed for muscle spasms.    . vitamin B-12 (CYANOCOBALAMIN) 500 MCG tablet Take 500 mcg by mouth daily.      No current facility-administered medications for this visit.    PHYSICAL EXAMINATION: ECOG PERFORMANCE STATUS: 1 - Symptomatic but completely ambulatory  Vitals:   12/18/19 1024  BP: 108/74  Pulse: 75  Resp: 18  Temp: 98 F (36.7 C)  SpO2: 92%   Filed Weights   12/18/19 1024  Weight: 229 lb 8 oz (104.1 kg)    LABORATORY DATA:  I have reviewed the data as listed CMP Latest Ref Rng & Units 12/08/2019 11/05/2019 11/03/2019  Glucose 70 - 99 mg/dL 150(H) 165(H) 126(H)  BUN 8 - 23 mg/dL 6(L) 19 24(H)  Creatinine 0.44 - 1.00 mg/dL 0.60 0.69 0.68  Sodium 135 - 145 mmol/L 143 145 144  Potassium 3.5 - 5.1 mmol/L 3.7 3.9 3.6  Chloride 98 - 111 mmol/L 102 106 101  CO2 22 - 32 mmol/L 27 31 33(H)  Calcium 8.9 - 10.3 mg/dL 9.2 9.3 9.1  Total Protein 6.5 - 8.1 g/dL 6.6 6.7 -  Total Bilirubin 0.3 - 1.2 mg/dL 0.3 <0.2(L) -  Alkaline Phos 38 - 126 U/L 63 77 -  AST 15 - 41 U/L 28 29 -  ALT 0 - 44 U/L 29 36 -    Lab Results  Component Value Date   WBC 5.7 12/08/2019   HGB 12.5 12/08/2019   HCT  40.9 12/08/2019   MCV 96.2 12/08/2019   PLT 265 12/08/2019   NEUTROABS 13.1 (H) 11/05/2019    ASSESSMENT & PLAN:  Malignant neoplasm of upper-outer quadrant of right breast in female, estrogen receptor negative ([place]) 09/07/2019:Routine  screening mammogram detected a 2.1cm mass in the left breast and no left axillary adenopathy. 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  Recommendations: 1. Neoadjuvant chemotherapy with Taxotere/Cytoxanx 2starting 09/24/2019 (discontinued because of respiratory failure after each chemotherapy) 2. Breast conserving surgery 12/09/2019: Microscopic foci of residual IDC grade 3, largest measured 2.5 mm foci of DCIS high-grade, resection margins negative, ER 0%, PR 0%, HER-2 negative, Ki-67 20% on the final path, 0/1 lymph node negative 3. Adjuvant radiation therapy --------------------------------------------------------------------------------------------------------------------------------------------- 1.Hospitalization 93/10/2161-44/05/9506: Diastolic CHF and asthma exacerbation with hypoxia (prednisone taper and Lasix) 2.  Hospitalization 10/30/2019-11/03/2019: Respiratory failure  Pathology counseling: I discussed the final pathology report of the patient provided  a copy of this report. I discussed the margins as well as lymph node surgeries. We also discussed the final staging along with previously performed ER/PR and HER-2/neu testing.  Plan: Adjuvant radiation therapy followed by surveillance I was very happy to see that she had a response even for 2 rounds of chemotherapy.  Based on this I do not recommend adjuvant Xeloda.  Return to clinic at the end of radiation.    No orders of the defined types were placed in this encounter.  The patient has a good understanding of the overall plan. she agrees with it. she will call with any problems that may develop before the next visit here.  Total time spent: 30 mins  including face to face time and time spent for planning, charting and coordination of care  Nicholas Lose, MD 12/18/2019  I, Cloyde Reams Dorshimer, am acting as scribe for Dr. Nicholas Lose.  I have reviewed the above documentation for accuracy and completeness, and I agree with the above.

## 2019-12-18 ENCOUNTER — Other Ambulatory Visit: Payer: Self-pay

## 2019-12-18 ENCOUNTER — Inpatient Hospital Stay: Payer: Medicare Other | Attending: Oncology | Admitting: Hematology and Oncology

## 2019-12-18 DIAGNOSIS — Z923 Personal history of irradiation: Secondary | ICD-10-CM | POA: Insufficient documentation

## 2019-12-18 DIAGNOSIS — Z171 Estrogen receptor negative status [ER-]: Secondary | ICD-10-CM | POA: Insufficient documentation

## 2019-12-18 DIAGNOSIS — Z79899 Other long term (current) drug therapy: Secondary | ICD-10-CM | POA: Insufficient documentation

## 2019-12-18 DIAGNOSIS — D0512 Intraductal carcinoma in situ of left breast: Secondary | ICD-10-CM | POA: Diagnosis not present

## 2019-12-18 DIAGNOSIS — Z9221 Personal history of antineoplastic chemotherapy: Secondary | ICD-10-CM | POA: Insufficient documentation

## 2019-12-18 DIAGNOSIS — C50411 Malignant neoplasm of upper-outer quadrant of right female breast: Secondary | ICD-10-CM | POA: Diagnosis not present

## 2019-12-18 NOTE — Assessment & Plan Note (Signed)
09/07/2019:Routine screening mammogram detected a 2.1cm mass in the left breast and no left axillary adenopathy. 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  Recommendations: 1. Neoadjuvant chemotherapy with Taxotere/Cytoxanx 2starting 09/24/2019 (discontinued because of respiratory failure after each chemotherapy) 2. Breast conserving surgery 12/09/2019: Microscopic foci of residual IDC grade 3, largest measured 2.5 mm foci of DCIS high-grade, resection margins negative, ER 0%, PR 0%, HER-2 negative, Ki-67 20% on the final path, 0/1 lymph node negative 3. Adjuvant radiation therapy --------------------------------------------------------------------------------------------------------------------------------------------- 1.Hospitalization 58/48/3507-57/02/2255: Diastolic CHF and asthma exacerbation with hypoxia (prednisone taper and Lasix) 2.  Hospitalization 10/30/2019-11/03/2019: Respiratory failure  Pathology counseling: I discussed the final pathology report of the patient provided  a copy of this report. I discussed the margins as well as lymph node surgeries. We also discussed the final staging along with previously performed ER/PR and HER-2/neu testing.  Plan: Adjuvant radiation therapy followed by surveillance I was very happy to see that she had a response even for 2 rounds of chemotherapy.  Based on this I do not recommend adjuvant Xeloda.  Return to clinic at the end of radiation.

## 2019-12-23 ENCOUNTER — Ambulatory Visit
Admission: RE | Admit: 2019-12-23 | Discharge: 2019-12-23 | Disposition: A | Discharge status: Home / Self Care | Payer: Medicare Other | Source: Ambulatory Visit | Attending: Radiation Oncology | Admitting: Radiation Oncology

## 2019-12-23 DIAGNOSIS — C50412 Malignant neoplasm of upper-outer quadrant of left female breast: Secondary | ICD-10-CM

## 2019-12-23 DIAGNOSIS — Z9889 Other specified postprocedural states: Secondary | ICD-10-CM | POA: Diagnosis not present

## 2019-12-23 DIAGNOSIS — C50411 Malignant neoplasm of upper-outer quadrant of right female breast: Secondary | ICD-10-CM | POA: Diagnosis not present

## 2019-12-23 DIAGNOSIS — Z95818 Presence of other cardiac implants and grafts: Secondary | ICD-10-CM | POA: Diagnosis not present

## 2019-12-23 DIAGNOSIS — Z9221 Personal history of antineoplastic chemotherapy: Secondary | ICD-10-CM | POA: Diagnosis not present

## 2019-12-23 DIAGNOSIS — G4733 Obstructive sleep apnea (adult) (pediatric): Secondary | ICD-10-CM | POA: Diagnosis not present

## 2019-12-23 DIAGNOSIS — Z171 Estrogen receptor negative status [ER-]: Secondary | ICD-10-CM | POA: Diagnosis not present

## 2019-12-23 NOTE — Progress Notes (Signed)
Radiation Oncology         [phone] ________________________________  Follow Up Outpatient Visit - Conducted via telephone due to current COVID-19 concerns for limiting patient exposure  I spoke with the patient to conduct this consult visit via telephone to spare the patient unnecessary potential exposure in the healthcare setting during the current COVID-19 pandemic. The patient was notified in advance and was offered a [place] meeting to allow for face to face communication but unfortunately reported that they did not have the appropriate resources/technology to support such a visit and instead preferred to proceed with a telephone visit.   ________________________________  Name: Shelby Anderson        MRN: [id number]  Date of Service: 12/23/2019 DOB: [birth year]  PO:[id number], Steele Berg, MD  Nicholas Lose, MD     REFERRING PHYSICIAN: Nicholas Lose, MD   DIAGNOSIS: The encounter diagnosis was Malignant neoplasm of upper-outer quadrant of right breast in female, estrogen receptor negative ([place]).   HISTORY OF PRESENT ILLNESS: Shelby Anderson is a 64 y.o. female originally seen in the multidisciplinary breast clinic for a new diagnosis of left breast cancer. The patient was noted to have a screening detected mass and distortion in the left breast.  She underwent diagnostic imaging which revealed a mass at 1:00 measuring 2.1 x 1.1 x 1 cm.  Her axilla was negative for adenopathy.  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  Her tumor was triple negative with a Ki-67 of 70%.  She proceeded with systemic chemotherapy. Her course was abreviated due to medical admissions to the hospital, so she only received 2 cycles of chemotherapy. That being said, she was found by ultrasound to have a reduction in the size of her lesion to 1.5 cm and no adenopathy was noted. She proceeded with left lumpectomy and sentinel node biopsy on 12/09/19 which revealed a  microscopic focus of grade 3 invasive ductal carcinoma with focal high grade DCIS. Her tumor was triple negative. Her margins were negative, and though it was felt that nodal tissue had been sampled, no lymphoid tissue was seen in the specimen. She is finished with systemic therapy and is contacted today to discuss options of adjuvant radiotherapy.   PREVIOUS RADIATION THERAPY: No   PAST MEDICAL HISTORY:  Past Medical History:  Diagnosis Date   Allergy    Anemia    "chronic"   Angina    Prinzmetal angina   Anxiety and depression    Arthritis    Asthma    Atrial fibrillation ([place])    h/o "AF w/frequent PVCs"   Breast cancer ([place])    Left   Cancer (HCC)    hx of skin cancer    CHF (congestive heart failure) (HCC)    Chronic back pain greater than 3 months duration    on chronic narcotics, treated at pain clinic   Colon polyps    hyperplastic   Coronary artery disease    Arrythmia, orthostatic hypotension, HLD, HTN; sees Dr. Einar Gip   Difficult intubation    "TMJ & woke up when they were still cutting on me"   Dysrhythmia    sees Dr. Einar Gip and a cardiologist at [facility]   Esophageal stricture    Family history of melanoma ]   Family history of pancreatic cancer    Fatty liver    Fatty liver    Fibroids    Fibromyalgia    "in my legs"  GERD (gastroesophageal reflux disease)    hx hiatal hernia, stricture and gastric ulcer   Headache(784.0)    migraines   Heart murmur    Hiatal hernia    History of loop recorder    History of migraines    "dx'd when I was in my teens"   Hyperlipemia    Hypertension    Mental disorder    Mild episode of recurrent major depressive disorder ([place]) 12/06/2015   Myocardial infarction [facility]) 1980's & 1990;   sees Dr. Einar Gip   OSA (obstructive sleep apnea)    Pneumonia    multiple times   PONV (postoperative nausea and vomiting)    Recurrent upper respiratory infection (URI)    Shortness of breath  11/20/11   "all the time", sees pulmonlogy, ? asthma   Stenotic cervical os    Stomach ulcer    "3 small; found in 05/2011"   TMJ (dislocation of temporomandibular joint)    Tuberculosis    + TB SKIN TEST   Type 2 diabetes mellitus without complication, without long-term current use of insulin ([place]) 12/06/2015   not on meds        PAST SURGICAL HISTORY: Past Surgical History:  Procedure Laterality Date   ACHILLES TENDON REPAIR  1970's   left ankle   ARTHROSCOPIC REPAIR ACL     left knee cap   BREAST BIOPSY Right 04/08/2013   again in October or November 2020   BREAST LUMPECTOMY WITH RADIOACTIVE SEED AND SENTINEL LYMPH NODE BIOPSY Left 12/09/2019   Procedure: LEFT BREAST LUMPECTOMY WITH RADIOACTIVE SEED AND LEFT AXILLARY SENTINEL LYMPH NODE BIOPSY;  Surgeon: Rolm Bookbinder, MD;  Location: [place];  Service: General;  Laterality: Left;  PEC BLOCK   CARDIAC CATHETERIZATION     loop recorder   CARPAL TUNNEL RELEASE  unknown   left hand   COLONOSCOPY     ESOPHAGOGASTRODUODENOSCOPY (EGD) WITH PROPOFOL N/A 10/29/2017   Procedure: ESOPHAGOGASTRODUODENOSCOPY (EGD) WITH PROPOFOL;  Surgeon: Irene Shipper, MD;  Location: [facility];  Service: Endoscopy;  Laterality: N/A;   LOOP RECORDER IMPLANT     PORT-A-CATH REMOVAL N/A 12/09/2019   Procedure: REMOVAL PORT-A-CATH;  Surgeon: Rolm Bookbinder, MD;  Location: [place];  Service: General;  Laterality: N/A;   PORTACATH PLACEMENT N/A 09/23/2019   Procedure: INSERTION PORT-A-CATH Right Internal Doreen Salvage;  Surgeon: Rolm Bookbinder, MD;  Location: [facility] OR;  Service: General;  Laterality: N/A;   post ganglionectomy  1970's   "for migraine headaches"   pouch string  16,10,96   "did this 3 times (once w/each pregnancy)"   SAVORY DILATION N/A 10/29/2017   Procedure: SAVORY DILATION;  Surgeon: Irene Shipper, MD;  Location: [facility];  Service: Endoscopy;  Laterality: N/A;   TOTAL KNEE ARTHROPLASTY Left 09/25/2016     Procedure: LEFT TOTAL KNEE ARTHROPLASTY;  Surgeon: Paralee Cancel, MD;  Location: [facility];  Service: Orthopedics;  Laterality: Left;   TUBAL LIGATION  1980's     FAMILY HISTORY:  Family History  Problem Relation Age of Onset   Malignant hyperthermia Father    Hypertension Father    Heart disease Father    Diabetes Father    Cancer Father        skin   Hypertension Mother    Heart disease Mother    Multiple myeloma Mother    Cancer Sister        CERVICAL   Hypertension Sister    Cancer Brother 38  MELANOMA   Heart disease Maternal Grandmother    Other Maternal Grandmother 32       complications of childbirth   Heart disease Maternal Grandfather    Cancer Paternal Grandmother        ?    Heart disease Paternal Grandmother    Heart disease Paternal Grandfather    Cancer Brother        LUNG   Diabetes Sister    Hypertension Sister    Heart disease Sister    Cancer Sister    Cancer Brother    Pancreatic cancer Niece 40   Cancer Nephew 56       unknown- currently in the [employer]   Anesthesia problems Neg Hx    Hypotension Neg Hx    Pseudochol deficiency Neg Hx    Colon cancer Neg Hx    Esophageal cancer Neg Hx    Rectal cancer Neg Hx    Stomach cancer Neg Hx    Breast cancer Neg Hx      SOCIAL HISTORY:  reports that she has never smoked. She has never used smokeless tobacco. She reports that she does not drink alcohol or use drugs.  The patient is married and lives in [place]. 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   ALLERGIES: Lodine [etodolac], Oxycontin [oxycodone hcl], Penicillins, Aspirin, Darvocet [propoxyphene n-acetaminophen], Nitroglycerin, Tramadol, Ultram [tramadol hcl], and Valium   MEDICATIONS:  Current Outpatient Medications  Medication Sig Dispense Refill   albuterol (PROVENTIL) (2.5 MG/3ML) 0.083% nebulizer solution Take 3 mLs (2.5 mg total) by nebulization every 6 (six)  hours as needed for wheezing or shortness of breath (J45.40). (Patient taking differently: Take 2.5 mg by nebulization every 6 (six) hours as needed for wheezing or shortness of breath. ) 75 mL 3   albuterol (VENTOLIN HFA) 108 (90 Base) MCG/ACT inhaler Inhale 2 puffs into the lungs every 6 (six) hours as needed for wheezing or shortness of breath. 3 g 3   aspirin EC 81 MG tablet Take 81 mg at bedtime by mouth.      atorvastatin (LIPITOR) 40 MG tablet Take 1 tablet (40 mg total) by mouth at bedtime. 90 tablet 1   Blood Glucose Monitoring Suppl (ONE TOUCH ULTRA 2) w/Device KIT USE TO CHECK BLOOD SUGAR 2 TIMES A DAY 1 kit 0   budesonide-formoterol (SYMBICORT) 80-4.5 MCG/ACT inhaler INHALE 2 PUFFS INTO THE LUNGS EVERY MORNING AND ANOTHER 2 PUFFS 12 HOURS LATER (Patient taking differently: Inhale 2 puffs into the lungs 2 (two) times daily. ) 30.6 Inhaler 1   Calcium Carbonate-Vitamin D 600-200 MG-UNIT TABS Take 1 tablet by mouth daily.     CINNAMON PO Take 1,000 mg 2 (two) times daily by mouth.     clobetasol cream (TEMOVATE) 0.05 % Apply topically 2 (two) times daily. (Patient taking differently: Apply topically daily as needed (rash). ) 30 g 0   doxepin (SINEQUAN) 10 MG capsule Take 20 mg by mouth at bedtime.      esomeprazole (NEXIUM) 40 MG capsule Take 1 capsule (40 mg total) by mouth 2 (two) times daily. (Patient taking differently: Take 80 mg by mouth 2 (two) times daily. ) 180 capsule 3   famotidine (PEPCID) 20 MG tablet Take 1 tablet (20 mg total) by mouth 2 (two) times daily as needed for heartburn or indigestion. 60 tablet 3   fluticasone (FLONASE) 50 MCG/ACT nasal spray Place 2 sprays at bedtime as needed into both nostrils  for allergies or rhinitis.      furosemide (LASIX) 20 MG tablet TAKE 1-2 TABLETS BY MOUTH DAILY AS DIRECTED. 60 tablet 0   glipiZIDE (GLUCOTROL XL) 5 MG 24 hr tablet TAKE 1 TABLET BY MOUTH EVERY DAY WITH BREAKFAST (Patient taking differently: Take 5 mg by mouth  daily with breakfast. ) 90 tablet 3   HYDROcodone-acetaminophen (NORCO) 10-325 MG tablet Take 1 tablet by mouth every 6 (six) hours as needed. 10 tablet 0   L-Methylfolate (DEPLIN) 7.5 MG TABS Take 7.5 mg by mouth daily with breakfast.      Lancets (ONETOUCH DELICA PLUS [id number]) MISC USE TO CHECK BLOOD SUGAR 2 TIMES A DAY 100 each 3   LORazepam (ATIVAN) 0.5 MG tablet Take 1 tablet (0.5 mg total) by mouth at bedtime as needed for sleep. 30 tablet 0   LYRICA 150 MG capsule Take 150 mg by mouth 3 (three) times daily.   2   metFORMIN (GLUCOPHAGE) 500 MG tablet TAKE 1 TABLET (500 MG TOTAL) BY MOUTH 2 (TWO) TIMES DAILY WITH A MEAL. 180 tablet 1   Misc Natural Products (GLUCOS-CHONDROIT-MSM COMPLEX PO) Take 1 tablet 3 (three) times daily by mouth.      mometasone (ELOCON) 0.1 % cream Apply 1 application topically daily as needed (irritation).      montelukast (SINGULAIR) 10 MG tablet TAKE 1 TABLET BY MOUTH EVERYDAY AT BEDTIME (Patient taking differently: Take 10 mg by mouth at bedtime. ) 90 tablet 1   Multiple Vitamin (MULITIVITAMIN WITH MINERALS) TABS Take 1 tablet by mouth daily.       nitroGLYCERIN (NITROSTAT) 0.4 MG SL tablet Place 0.4 mg under the tongue every 5 (five) minutes as needed for chest pain.      ondansetron (ZOFRAN) 4 MG tablet Take 4 mg by mouth every 8 (eight) hours as needed for nausea or vomiting.     ondansetron (ZOFRAN) 8 MG tablet Take 1 tablet (8 mg total) by mouth 2 (two) times daily as needed for refractory nausea / vomiting. Start on day 3 after chemo. 30 tablet 1   ONETOUCH ULTRA test strip USE TO CHECK BLOOD SUGAR 2 TIMES A DAY 100 strip 1   Oxycodone HCl 20 MG TABS Take 20 mg every 4 (four) hours as needed by mouth (pain).      OXYGEN Inhale 2 L into the lungs at bedtime as needed (shortness of breath).      potassium chloride (K-DUR) 10 MEQ tablet TAKE 2 TABLETS BY MOUTH EVERY DAY (Patient taking differently: Take 20 mEq by mouth daily. ) 180 tablet 1    prochlorperazine (COMPAZINE) 10 MG tablet Take 1 tablet (10 mg total) by mouth every 6 (six) hours as needed (Nausea or vomiting). 30 tablet 1   promethazine (PHENERGAN) 25 MG tablet Take 1 tablet (25 mg total) by mouth every 6 (six) hours as needed for nausea or vomiting. (Patient taking differently: Take 25 mg by mouth at bedtime. Additional as needed) 90 tablet 3   sucralfate (CARAFATE) 1 g tablet Take 1 tablet (1 g total) by mouth 4 (four) times daily -  with meals and at bedtime. Before taking slowly dissolve tablet in 1 Tablespoon of distilled water for about 15 minutes to creat a slurry. (Patient taking differently: Take 1 g by mouth 4 (four) times daily. Before taking slowly dissolve tablet in 1 Tablespoon of distilled water for about 15 minutes to creat a slurry.) 360 tablet 3   tiZANidine (ZANAFLEX) 4 MG tablet Take 4  mg by mouth every 8 (eight) hours as needed for muscle spasms.     vitamin B-12 (CYANOCOBALAMIN) 500 MCG tablet Take 500 mcg by mouth daily.      No current facility-administered medications for this encounter.     REVIEW OF SYSTEMS: On review of systems, the patient reports that she is doing well overall. She reports her surgical site is healing well but is somewhat sore. She is scheduled to see Dr. Donne Hazel later this week for a postoperative check but denies any edema or swelling of her breast or upper extremity. She denies any chest pain, shortness of breath, cough, fevers, chills, night sweats, unintended weight changes. She denies any bowel or bladder disturbances, and denies abdominal pain, nausea or vomiting. She denies any new musculoskeletal or joint aches or pains. A complete review of systems is obtained and is otherwise negative.     PHYSICAL EXAM:  Unable to assess due to encounter.    ECOG = 0  0 - Asymptomatic (Fully active, able to carry on all predisease activities without restriction)  1 - Symptomatic but completely ambulatory (Restricted in  physically strenuous activity but ambulatory and able to carry out work of a light or sedentary nature. For example, light housework, office work)  2 - Symptomatic, <50% in bed during the day (Ambulatory and capable of all self care but unable to carry out any work activities. Up and about more than 50% of waking hours)  3 - Symptomatic, >50% in bed, but not bedbound (Capable of only limited self-care, confined to bed or chair 50% or more of waking hours)  4 - Bedbound (Completely disabled. Cannot carry on any self-care. Totally confined to bed or chair)  5 - Death   Eustace Pen MM, Creech RH, Tormey DC, et al. [phone]). "Toxicity and response criteria of the [facility]". [place] Oncol. 5 (6): 649-55    LABORATORY DATA:  Lab Results  Component Value Date   WBC 5.7 12/08/2019   HGB 12.5 12/08/2019   HCT 40.9 12/08/2019   MCV 96.2 12/08/2019   PLT 265 12/08/2019   Lab Results  Component Value Date   NA 143 12/08/2019   K 3.7 12/08/2019   CL 102 12/08/2019   CO2 27 12/08/2019   Lab Results  Component Value Date   ALT 29 12/08/2019   AST 28 12/08/2019   ALKPHOS 63 12/08/2019   BILITOT 0.3 12/08/2019      RADIOGRAPHY: NM Sentinel Node Inj-No Rpt (Breast)  Result Date: 12/09/2019 Sulfur colloid was injected by the nuclear medicine technologist for melanoma sentinel node.   MM Breast Surgical Specimen  Result Date: 12/09/2019 CLINICAL DATA:  Evaluate surgical specimen following lumpectomy for LEFT breast cancer. EXAM: SPECIMEN RADIOGRAPH OF THE LEFT BREAST COMPARISON:  Previous exam(s). FINDINGS: Status post excision of the LEFT breast. The radioactive seed and biopsy marker clip are present, completely intact, and were marked for pathology. IMPRESSION: Specimen radiograph of the LEFT breast. Electronically Signed   By: Margarette Canada M.D.   On: 12/09/2019 09:32   MM LT RADIOACTIVE SEED LOC MAMMO GUIDE  Result Date: 12/08/2019 CLINICAL DATA:  Patient presents  for seed localization prior to LEFT lumpectomy for recently diagnosed invasive ductal carcinoma. Patient has undergone neoadjuvant treatment. EXAM: MAMMOGRAPHIC GUIDED RADIOACTIVE SEED LOCALIZATION OF THE LEFT BREAST COMPARISON:  Previous exam(s). FINDINGS: Patient presents for radioactive seed localization prior to lumpectomy. I met with the patient and we discussed the procedure of seed localization including  benefits and alternatives. We discussed the high likelihood of a successful procedure. We discussed the risks of the procedure including infection, bleeding, tissue injury and further surgery. We discussed the low dose of radioactivity involved in the procedure. Informed, written consent was given. The usual time-out protocol was performed immediately prior to the procedure. Using mammographic guidance, sterile technique, 1% lidocaine and an I-125 radioactive seed, the ribbon shaped clip in the UPPER-OUTER QUADRANT of the LEFT breast was localized using a craniocaudal approach. The follow-up mammogram images confirm the seed in the expected location and were marked for Dr. Donne Hazel. Follow-up survey of the patient confirms presence of the radioactive seed. Order number of I-125 seed:  [id number]. Total activity:  8.677 millicuries reference Date: 11/02/2019 The patient tolerated the procedure well and was released from the [facility]. She was given instructions regarding seed removal. IMPRESSION: Radioactive seed localization left breast. No apparent complications. Electronically Signed   By: Nolon Nations M.D.   On: 12/08/2019 15:03       IMPRESSION/PLAN: 1. 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 Dr. Lisbeth Renshaw discusses the final pathology findings and reviews the nature of triple negative left breast disease. She has completed her systemic therapy though abbreviated, and she's healing from surgery. She would benefit from adjuvant radiotherapy to the  right breast with high tangent fields to reduce the risks of local recurrence. We discussed the risks, benefits, short, and long term effects of radiotherapy, and the patient is interested in proceeding. Dr. Lisbeth Renshaw discusses the delivery and logistics of radiotherapy and anticipates a course of 4  weeks of radiotherapy. She will return on 01/05/20 for simulation at which time she will sign written consent to proceed.  2. Indwelling Loop Recorder. The patient's loop recorder is in situ and she reports Dr. Norville Haggard checks her recorder q3 months at [facility]. The battery has not been actively recording for about a year per report, but it was recommended that she keep her device and have the battery replaced if she became symptomatic. We will reach out to Dr. Rosita Fire for permission to proceed with radiotherapy with the in situ device.   Given current concerns for patient exposure during the COVID-19 pandemic, this encounter was conducted via telephone.  The patient has given verbal consent for this type of encounter. The time spent during this encounter was 45 minutes and 50% of that time was spent in the coordination of her care. The attendants for this meeting include Dr. Lisbeth Renshaw, Shona Simpson, [facility] and [name]  During the encounter, Dr. Lisbeth Renshaw and Shona Simpson [facility] were located at [facility] Radiation Oncology Department.  [name]  was located at home.  The above documentation reflects my direct findings during this shared patient visit. Please see the separate note by Dr. Lisbeth Renshaw on this date for the remainder of the patient's plan of care.    Carola Rhine, PAC

## 2019-12-24 ENCOUNTER — Telehealth: Payer: Self-pay | Admitting: Radiation Oncology

## 2019-12-24 DIAGNOSIS — J189 Pneumonia, unspecified organism: Secondary | ICD-10-CM | POA: Diagnosis not present

## 2019-12-24 DIAGNOSIS — R0902 Hypoxemia: Secondary | ICD-10-CM | POA: Diagnosis not present

## 2019-12-24 DIAGNOSIS — J4540 Moderate persistent asthma, uncomplicated: Secondary | ICD-10-CM | POA: Diagnosis not present

## 2019-12-24 DIAGNOSIS — J45909 Unspecified asthma, uncomplicated: Secondary | ICD-10-CM | POA: Diagnosis not present

## 2019-12-24 NOTE — Telephone Encounter (Signed)
Faxed ICD form to Dr. Norville Haggard to complete as requested by Shona Simpson, PA-C. Fax confirmation of delivery obtained. Awaiting return fax.

## 2019-12-24 NOTE — Telephone Encounter (Signed)
-----   Message from Hayden Pedro, [place] sent at 12/23/2019 12:28 PM EST ----- Can you send ICD forms for ms. Shelby-health's loop recorder to Dr. Norville Haggard at [facility]? She's left sided, so it's probably about 3-5 cm from the treatment site. She is to start early FEbruary!Felix Ahmadi

## 2019-12-24 NOTE — Addendum Note (Signed)
Encounter addended by: Kyung Rudd, MD on: 12/24/2019 10:08 AM  Actions taken: Problem List modified, Visit diagnoses modified

## 2019-12-25 ENCOUNTER — Ambulatory Visit: Payer: Medicare Other | Admitting: Podiatry

## 2019-12-25 ENCOUNTER — Telehealth (INDEPENDENT_AMBULATORY_CARE_PROVIDER_SITE_OTHER): Payer: Medicare Other | Admitting: Neurology

## 2019-12-25 ENCOUNTER — Other Ambulatory Visit: Payer: Self-pay

## 2019-12-25 ENCOUNTER — Encounter: Payer: Self-pay | Admitting: Neurology

## 2019-12-25 ENCOUNTER — Encounter: Payer: Self-pay | Admitting: Podiatry

## 2019-12-25 VITALS — Ht 66.0 in | Wt 226.0 lb

## 2019-12-25 DIAGNOSIS — R519 Headache, unspecified: Secondary | ICD-10-CM

## 2019-12-25 DIAGNOSIS — B351 Tinea unguium: Secondary | ICD-10-CM

## 2019-12-25 DIAGNOSIS — R42 Dizziness and giddiness: Secondary | ICD-10-CM

## 2019-12-25 DIAGNOSIS — E1151 Type 2 diabetes mellitus with diabetic peripheral angiopathy without gangrene: Secondary | ICD-10-CM | POA: Diagnosis not present

## 2019-12-25 DIAGNOSIS — M542 Cervicalgia: Secondary | ICD-10-CM

## 2019-12-25 DIAGNOSIS — L601 Onycholysis: Secondary | ICD-10-CM

## 2019-12-25 DIAGNOSIS — T451X5A Adverse effect of antineoplastic and immunosuppressive drugs, initial encounter: Secondary | ICD-10-CM

## 2019-12-25 MED ORDER — MUPIROCIN CALCIUM 2 % EX CREA: TOPICAL_CREAM | CUTANEOUS | 1 refills | Status: DC

## 2019-12-25 NOTE — Patient Instructions (Addendum)
Apply Mupirocin Ointment to toenails/skin lesions once daily.   Diabetes Mellitus and Foot Care Foot care is an important part of your health, especially when you have diabetes. Diabetes may cause you to have problems because of poor blood flow (circulation) to your feet and legs, which can cause your skin to:  Become thinner and drier.  Break more easily.  Heal more slowly.  Peel and crack. You may also have nerve damage (neuropathy) in your legs and feet, causing decreased feeling in them. This means that you may not notice minor injuries to your feet that could lead to more serious problems. Noticing and addressing any potential problems early is the best way to prevent future foot problems. How to care for your feet Foot hygiene  Wash your feet daily with warm water and mild soap. Do not use hot water. Then, pat your feet and the areas between your toes until they are completely dry. Do not soak your feet as this can dry your skin.  Trim your toenails straight across. Do not dig under them or around the cuticle. File the edges of your nails with an emery board or nail file.  Apply a moisturizing lotion or petroleum jelly to the skin on your feet and to dry, brittle toenails. Use lotion that does not contain alcohol and is unscented. Do not apply lotion between your toes. Shoes and socks  Wear clean socks or stockings every day. Make sure they are not too tight. Do not wear knee-high stockings since they may decrease blood flow to your legs.  Wear shoes that fit properly and have enough cushioning. Always look in your shoes before you put them on to be sure there are no objects inside.  To break in new shoes, wear them for just a few hours a day. This prevents injuries on your feet. Wounds, scrapes, corns, and calluses  Check your feet daily for blisters, cuts, bruises, sores, and redness. If you cannot see the bottom of your feet, use a mirror or ask someone for help.  Do not cut  corns or calluses or try to remove them with medicine.  If you find a minor scrape, cut, or break in the skin on your feet, keep it and the skin around it clean and dry. You may clean these areas with mild soap and water. Do not clean the area with peroxide, alcohol, or iodine.  If you have a wound, scrape, corn, or callus on your foot, look at it several times a day to make sure it is healing and not infected. Check for: ? Redness, swelling, or pain. ? Fluid or blood. ? Warmth. ? Pus or a bad smell. General instructions  Do not cross your legs. This may decrease blood flow to your feet.  Do not use heating pads or hot water bottles on your feet. They may burn your skin. If you have lost feeling in your feet or legs, you may not know this is happening until it is too late.  Protect your feet from hot and cold by wearing shoes, such as at the beach or on hot pavement.  Schedule a complete foot exam at least once a year (annually) or more often if you have foot problems. If you have foot problems, report any cuts, sores, or bruises to your health care provider immediately. Contact a health care provider if:  You have a medical condition that increases your risk of infection and you have any cuts, sores, or bruises on  your feet.  You have an injury that is not healing.  You have redness on your legs or feet.  You feel burning or tingling in your legs or feet.  You have pain or cramps in your legs and feet.  Your legs or feet are numb.  Your feet always feel cold.  You have pain around a toenail. Get help right away if:  You have a wound, scrape, corn, or callus on your foot and: ? You have pain, swelling, or redness that gets worse. ? You have fluid or blood coming from the wound, scrape, corn, or callus. ? Your wound, scrape, corn, or callus feels warm to the touch. ? You have pus or a bad smell coming from the wound, scrape, corn, or callus. ? You have a fever. ? You have a  red line going up your leg. Summary  Check your feet every day for cuts, sores, red spots, swelling, and blisters.  Moisturize feet and legs daily.  Wear shoes that fit properly and have enough cushioning.  If you have foot problems, report any cuts, sores, or bruises to your health care provider immediately.  Schedule a complete foot exam at least once a year (annually) or more often if you have foot problems. This information is not intended to replace advice given to you by your health care provider. Make sure you discuss any questions you have with your health care provider. Document Revised: 08/12/2019 Document Reviewed: 12/21/2016 Elsevier Patient Education  [place].

## 2019-12-25 NOTE — Progress Notes (Signed)
   Due to the COVID-19 crisis, this virtual check-in visit was done via telephone from my office and it was initiated and consent given by this patient and or family.   Telephone (Audio) Visit The purpose of this telephone visit is to provide medical care while limiting exposure to the novel coronavirus.    Consent was obtained for telephone visit and initiated by pt/family:  Yes.   Answered questions that patient had about telehealth interaction:  Yes.   I discussed the limitations, risks, security and privacy concerns of performing an evaluation and management service by telephone. I also discussed with the patient that there may be a patient responsible charge related to this service. The patient expressed understanding and agreed to proceed.  Pt location: Home Physician Location: office Name of referring provider:  Lucretia Kern, DO I connected with .[name] at patients initiation/request on 12/25/2019 at  2:30 PM EST by telephone and verified that I am speaking with the correct person using two identifiers.  Pt MRN:  [id number] Pt DOB:  Apr 09, 1956   History of Present Illness: This is a 64 year old female returning for follow-up of vertigo, neck pain, and new complaints of morning headaches.  At her last visit, I suggested she start taking meclizine and vestibular therapy.  Unfortunately, she has had several hospitalizations since her last visit and underwent surgery for left breast cancer, and has not been able to do any vestibular exercises.  She continues to have episodic vertigo and nausea, which is triggered by looking down, and quickly resolves within about a minute.  She also reports having history of headaches, with new headaches that are worse in the morning.  She denies any vision changes, numbness tingling, weakness.   Assessment and Plan:   1.  Morning headaches in cancer patient which is new warrants intracranial imaging.  Last MRI brain was done in 2011 was within  normal limits.   - MRI brain wwo contrast  2.  BPPV  - Continue meclizine 25mg  twice daily as needed  - Patient will call my office when she would like to to start vestibular therapy  Follow Up Instructions:   I discussed the assessment and treatment plan with the patient. The patient was provided an opportunity to ask questions and all were answered. The patient agreed with the plan and demonstrated an understanding of the instructions.   The patient was advised to call back or seek an in-person evaluation if the symptoms worsen or if the condition fails to improve as anticipated.    Total Time spent in visit with the patient was:  9 min, of which 100% of the time was spent in counseling and/or coordinating care.   Pt understands and agrees with the plan of care outlined.     Alda Berthold, DO

## 2019-12-28 ENCOUNTER — Encounter: Payer: Self-pay | Admitting: *Deleted

## 2019-12-29 ENCOUNTER — Encounter: Payer: Self-pay | Admitting: *Deleted

## 2019-12-31 ENCOUNTER — Other Ambulatory Visit: Payer: Self-pay

## 2019-12-31 ENCOUNTER — Ambulatory Visit (INDEPENDENT_AMBULATORY_CARE_PROVIDER_SITE_OTHER): Payer: Medicare Other

## 2019-12-31 ENCOUNTER — Ambulatory Visit: Payer: Medicare Other | Attending: General Surgery

## 2019-12-31 VITALS — BP 135/81 | HR 72 | Ht 66.0 in | Wt 224.5 lb

## 2019-12-31 DIAGNOSIS — M25611 Stiffness of right shoulder, not elsewhere classified: Secondary | ICD-10-CM | POA: Diagnosis not present

## 2019-12-31 DIAGNOSIS — Z171 Estrogen receptor negative status [ER-]: Secondary | ICD-10-CM | POA: Diagnosis not present

## 2019-12-31 DIAGNOSIS — G8929 Other chronic pain: Secondary | ICD-10-CM | POA: Diagnosis not present

## 2019-12-31 DIAGNOSIS — M25512 Pain in left shoulder: Secondary | ICD-10-CM | POA: Insufficient documentation

## 2019-12-31 DIAGNOSIS — H9190 Unspecified hearing loss, unspecified ear: Secondary | ICD-10-CM

## 2019-12-31 DIAGNOSIS — R293 Abnormal posture: Secondary | ICD-10-CM | POA: Diagnosis not present

## 2019-12-31 DIAGNOSIS — Z0000 Encounter for general adult medical examination without abnormal findings: Secondary | ICD-10-CM

## 2019-12-31 DIAGNOSIS — C50412 Malignant neoplasm of upper-outer quadrant of left female breast: Secondary | ICD-10-CM | POA: Diagnosis not present

## 2019-12-31 DIAGNOSIS — M25612 Stiffness of left shoulder, not elsewhere classified: Secondary | ICD-10-CM | POA: Diagnosis not present

## 2019-12-31 DIAGNOSIS — M25511 Pain in right shoulder: Secondary | ICD-10-CM | POA: Diagnosis not present

## 2019-12-31 NOTE — Patient Instructions (Signed)
Ms. Shelby Anderson , Thank you for taking time to participate in your Medicare Wellness Visit. I appreciate your ongoing commitment to your health goals. Please review the following plan we discussed and let me know if I can assist you in the future.   Screening recommendations/referrals: Colorectal Screening: completed 12/29/2010; due again 12/29/2020. Mammogram: 11/19/2019. Undergoing treatment for breast cancer. Bone Density: completed 03/17/2015. Normal results.  Vision and Dental Exams: Recommended annual ophthalmology exams for early detection of glaucoma and other disorders of the eye Recommended annual dental exams for proper oral hygiene  Diabetic Exams: Diabetic Eye Exam: patient reports she needs to schedule an exam due to problems with her vision. Diabetic Foot Exam: completed 12/25/2019. Due again 12/24/2020.  Vaccinations: Influenza vaccine: completed 09/24/2019. Due again in Fall 2021. Pneumococcal vaccine: completed 12/12/2017. Needs PCV13 now that high risk category.  Tdap vaccine: completed 06/28/2014; due again 06/28/2024.  Shingles vaccine: Speak to your pharmacy to determine your out of pocket expense for the Shingrix vaccine. You may receive this vaccine at your local pharmacy. This is a series of two injections to be given 2-6 months apart. Medicare does not cover this cost. Clear with oncology before doing this.  Advanced directives: Advance directives discussed with you today. I have provided a copy for you to complete at home and have notarized. Once this is complete please bring a copy in to our office so we can scan it into your chart.  Goals: Recommend to drink at least 6-8 8oz glasses of water per day. Try to exercise in any way that you can. Recommend to remove any items from the home that may cause slips or trips.  Next appointment: Please schedule your Annual Wellness Visit with your Nurse Health Advisor in one year.  Preventive Care 62 Years and Older,  Female Preventive care refers to lifestyle choices and visits with your health care provider that can promote health and wellness. What does preventive care include?  A yearly physical exam. This is also called an annual well check.  Dental exams once or twice a year.  Routine eye exams. Ask your health care provider how often you should have your eyes checked.  Personal lifestyle choices, including:  Daily care of your teeth and gums.  Regular physical activity.  Eating a healthy diet.  Avoiding tobacco and drug use.  Limiting alcohol use.  Practicing safe sex.  Taking low-dose aspirin every day if recommended by your health care provider.  Taking vitamin and mineral supplements as recommended by your health care provider. What happens during an annual well check? The services and screenings done by your health care provider during your annual well check will depend on your age, overall health, lifestyle risk factors, and family history of disease. Counseling  Your health care provider may ask you questions about your:  Alcohol use.  Tobacco use.  Drug use.  Emotional well-being.  Home and relationship well-being.  Sexual activity.  Eating habits.  History of falls.  Memory and ability to understand (cognition).  Work and work Statistician.  Reproductive health. Screening  You may have the following tests or measurements:  Height, weight, and BMI.  Blood pressure.  Lipid and cholesterol levels. These may be checked every 5 years, or more frequently if you are over 50 years old.  Skin check.  Lung cancer screening. You may have this screening every year starting at age 62 if you have a 30-pack-year history of smoking and currently smoke or have quit within the  past 15 years.  Fecal occult blood test (FOBT) of the stool. You may have this test every year starting at age 62.  Flexible sigmoidoscopy or colonoscopy. You may have a sigmoidoscopy every 5  years or a colonoscopy every 10 years starting at age 50.  Hepatitis C blood test.  Hepatitis B blood test.  Sexually transmitted disease (STD) testing.  Diabetes screening. This is done by checking your blood sugar (glucose) after you have not eaten for a while (fasting). You may have this done every 1-3 years.  Bone density scan. This is done to screen for osteoporosis. You may have this done starting at age 62.  Mammogram. This may be done every 1-2 years. Talk to your health care provider about how often you should have regular mammograms. Talk with your health care provider about your test results, treatment options, and if necessary, the need for more tests. Vaccines  Your health care provider may recommend certain vaccines, such as:  Influenza vaccine. This is recommended every year.  Tetanus, diphtheria, and acellular pertussis (Tdap, Td) vaccine. You may need a Td booster every 10 years.  Zoster vaccine. You may need this after age 62.  Pneumococcal 13-valent conjugate (PCV13) vaccine. One dose is recommended after age 62.  Pneumococcal polysaccharide (PPSV23) vaccine. One dose is recommended after age 65. Talk to your health care provider about which screenings and vaccines you need and how often you need them. This information is not intended to replace advice given to you by your health care provider. Make sure you discuss any questions you have with your health care provider. Document Released: 12/16/2015 Document Revised: 08/08/2016 Document Reviewed: 09/20/2015 Elsevier Interactive Patient Education  2017 [place] Prevention in the Home Falls can cause injuries. They can happen to people of all ages. There are many things you can do to make your home safe and to help prevent falls. What can I do on the outside of my home?  Regularly fix the edges of walkways and driveways and fix any cracks.  Remove anything that might make you trip as you walk through  a door, such as a raised step or threshold.  Trim any bushes or trees on the path to your home.  Use bright outdoor lighting.  Clear any walking paths of anything that might make someone trip, such as rocks or tools.  Regularly check to see if handrails are loose or broken. Make sure that both sides of any steps have handrails.  Any raised decks and porches should have guardrails on the edges.  Have any leaves, snow, or ice cleared regularly.  Use sand or salt on walking paths during winter.  Clean up any spills in your garage right away. This includes oil or grease spills. What can I do in the bathroom?  Use night lights.  Install grab bars by the toilet and in the tub and shower. Do not use towel bars as grab bars.  Use non-skid mats or decals in the tub or shower.  If you need to sit down in the shower, use a plastic, non-slip stool.  Keep the floor dry. Clean up any water that spills on the floor as soon as it happens.  Remove soap buildup in the tub or shower regularly.  Attach bath mats securely with double-sided non-slip rug tape.  Do not have throw rugs and other things on the floor that can make you trip. What can I do in the bedroom?  Use night lights.  Make sure that you have a light by your bed that is easy to reach.  Do not use any sheets or blankets that are too big for your bed. They should not hang down onto the floor.  Have a firm chair that has side arms. You can use this for support while you get dressed.  Do not have throw rugs and other things on the floor that can make you trip. What can I do in the kitchen?  Clean up any spills right away.  Avoid walking on wet floors.  Keep items that you use a lot in easy-to-reach places.  If you need to reach something above you, use a strong step stool that has a grab bar.  Keep electrical cords out of the way.  Do not use floor polish or wax that makes floors slippery. If you must use wax, use  non-skid floor wax.  Do not have throw rugs and other things on the floor that can make you trip. What can I do with my stairs?  Do not leave any items on the stairs.  Make sure that there are handrails on both sides of the stairs and use them. Fix handrails that are broken or loose. Make sure that handrails are as long as the stairways.  Check any carpeting to make sure that it is firmly attached to the stairs. Fix any carpet that is loose or worn.  Avoid having throw rugs at the top or bottom of the stairs. If you do have throw rugs, attach them to the floor with carpet tape.  Make sure that you have a light switch at the top of the stairs and the bottom of the stairs. If you do not have them, ask someone to add them for you. What else can I do to help prevent falls?  Wear shoes that:  Do not have high heels.  Have rubber bottoms.  Are comfortable and fit you well.  Are closed at the toe. Do not wear sandals.  If you use a stepladder:  Make sure that it is fully opened. Do not climb a closed stepladder.  Make sure that both sides of the stepladder are locked into place.  Ask someone to hold it for you, if possible.  Clearly mark and make sure that you can see:  Any grab bars or handrails.  First and last steps.  Where the edge of each step is.  Use tools that help you move around (mobility aids) if they are needed. These include:  Canes.  Walkers.  Scooters.  Crutches.  Turn on the lights when you go into a dark area. Replace any light bulbs as soon as they burn out.  Set up your furniture so you have a clear path. Avoid moving your furniture around.  If any of your floors are uneven, fix them.  If there are any pets around you, be aware of where they are.  Review your medicines with your doctor. Some medicines can make you feel dizzy. This can increase your chance of falling. Ask your doctor what other things that you can do to help prevent falls. This  information is not intended to replace advice given to you by your health care provider. Make sure you discuss any questions you have with your health care provider. Document Released: 09/15/2009 Document Revised: 04/26/2016 Document Reviewed: 12/24/2014 Elsevier Interactive Patient Education  2017 [employer].

## 2019-12-31 NOTE — Progress Notes (Signed)
This visit is being conducted via phone call due to the COVID-19 pandemic. This patient has given me verbal consent via phone to conduct this visit, patient states they are participating from their home address. Some vital signs may be absent or patient reported.   Patient identification: identified by name, DOB, and current address.  Location provider: [facility], Office Persons participating in the virtual visit: Mrs. Brigitta Pricer and Franne Forts, LPN.    Subjective:   Shelby Anderson is a 64 y.o. female who presents for Medicare Annual (Subsequent) preventive examination.  [name] was diagnosed in October 2020 with left breast cancer. She recently had a lumpectomy and is experiencing discomfort from that. She is under a great deal of stress associated with financial strain of massive medical bills and  costs of medications which are making it difficult to buy groceries and pay bills. She is disabled and husband is retired. They also have custody of two grand-daughters ages 98 and 22 who are at home with them during the day with virtual school. She complains of decreased hearing, increased depression, nausea/vomiting, and loss of appetite.  Review of Systems:  No ROS; Annual Medicare Wellness Subsequent Visit      Objective:     Vitals: BP 135/81   Pulse 72   Ht '5\' 6"'  (1.676 m)   Wt 224 lb 8 oz (101.8 kg)   BMI 36.24 kg/m   Body mass index is 36.24 kg/m.  Advanced Directives 12/31/2019 12/08/2019 11/04/2019 10/31/2019 10/30/2019 10/01/2019 09/30/2019  Does Patient Have a Medical Advance Directive? No Yes Yes Yes Yes Yes Yes  Type of Advance Directive - Healthcare Power of [place];Living will Living will Healthcare Power of [place]  Does patient want to make changes to medical advance directive? - No - Patient declined No - Patient declined No - Patient declined -  No - Patient declined -  Copy of [place] in Chart? - No - copy requested - - - No - copy requested -  Would patient like information on creating a medical advance directive? No - Patient declined - - - - No - Patient declined -  Pre-existing out of facility DNR order (yellow form or pink MOST form) - - - - - - -    Tobacco Social History   Tobacco Use  Smoking Status Never Smoker  Smokeless Tobacco Never Used     Counseling given: Not Answered   Clinical Intake:  Pre-visit preparation completed: Yes  Pain Score: 6  Pain Location: Breast Pain Orientation: Left     BMI - recorded: 36.24 Nutritional Status: BMI > 30  Obese Diabetes: Yes CBG done?: (142 fasting this morning)  How often do you need to have someone help you when you read instructions, pamphlets, or other written materials from your doctor or pharmacy?: 1 - Never What is the last grade level you completed in school?: 3 years college  Interpreter Needed?: No  Information entered by :: Franne Forts, LPN.  Past Medical History:  Diagnosis Date  . Allergy   . Anemia    "chronic"  . Angina    Prinzmetal angina  . Anxiety and depression   . Arthritis   . Asthma   . Atrial fibrillation ([place])    h/o "AF w/frequent PVCs"  . Breast cancer ([place])    Left  . Cancer (HCC)    hx of skin cancer   .  CHF (congestive heart failure) ([place])   . Chronic back pain greater than 3 months duration    on chronic narcotics, treated at pain clinic  . Colon polyps    hyperplastic  . Coronary artery disease    Arrythmia, orthostatic hypotension, HLD, HTN; sees Dr. Einar Gip  . Difficult intubation    "TMJ & woke up when they were still cutting on me"  . Dysrhythmia    sees Dr. Einar Gip and a [facility]ologist at [facility]  . Esophageal stricture   . Family history of melanoma ]  . Family history of pancreatic cancer   . Fatty liver   . Fatty liver   . Fibroids   . Fibromyalgia    "in my legs"  . GERD  (gastroesophageal reflux disease)    hx hiatal hernia, stricture and gastric ulcer  . Headache(784.0)    migraines  . Heart murmur   . Hiatal hernia   . History of loop recorder   . History of migraines    "dx'd when I was in my teens"  . Hyperlipemia   . Hypertension   . Mental disorder   . Mild episode of recurrent major depressive disorder ([place]) 12/06/2015  . Myocardial infarction [facility]) 1980's & 1990;   sees Dr. Einar Gip  . OSA (obstructive sleep apnea)   . Pneumonia    multiple times  . PONV (postoperative nausea and vomiting)   . Recurrent upper respiratory infection (URI)   . Shortness of breath 11/20/11   "all the time", sees pulmonlogy, ? asthma  . Stenotic cervical os   . Stomach ulcer    "3 small; found in 05/2011"  . TMJ (dislocation of temporomandibular joint)   . Tuberculosis    + TB SKIN TEST  . Type 2 diabetes mellitus without complication, without long-term current use of insulin ([place]) 12/06/2015   not on meds    Past Surgical History:  Procedure Laterality Date  . ACHILLES TENDON REPAIR  1970's   left ankle  . ARTHROSCOPIC REPAIR ACL     left knee cap  . BREAST BIOPSY Right 04/08/2013   again in October or November 2020  . BREAST LUMPECTOMY WITH RADIOACTIVE SEED AND SENTINEL LYMPH NODE BIOPSY Left 12/09/2019   Procedure: LEFT BREAST LUMPECTOMY WITH RADIOACTIVE SEED AND LEFT AXILLARY SENTINEL LYMPH NODE BIOPSY;  Surgeon: Rolm Bookbinder, MD;  Location: [place];  Service: General;  Laterality: Left;  PEC BLOCK  . CARDIAC CATHETERIZATION     loop recorder  . CARPAL TUNNEL RELEASE  unknown   left hand  . COLONOSCOPY    . ESOPHAGOGASTRODUODENOSCOPY (EGD) WITH PROPOFOL N/A 10/29/2017   Procedure: ESOPHAGOGASTRODUODENOSCOPY (EGD) WITH PROPOFOL;  Surgeon: Irene Shipper, MD;  Location: [facility];  Service: Endoscopy;  Laterality: N/A;  . LOOP RECORDER IMPLANT    . PORT-A-CATH REMOVAL N/A 12/09/2019   Procedure: REMOVAL PORT-A-CATH;  Surgeon: Rolm Bookbinder, MD;   Location: [place];  Service: General;  Laterality: N/A;  . PORTACATH PLACEMENT N/A 09/23/2019   Procedure: INSERTION PORT-A-CATH Right Internal Doreen Salvage;  Surgeon: Rolm Bookbinder, MD;  Location: [place];  Service: General;  Laterality: N/A;  . post ganglionectomy  1970's   "for migraine headaches"  . pouch string  [phone]   "did this 3 times (once w/each pregnancy)"  . SAVORY DILATION N/A 10/29/2017   Procedure: SAVORY DILATION;  Surgeon: Irene Shipper, MD;  Location: [facility];  Service: Endoscopy;  Laterality: N/A;  . TOTAL KNEE ARTHROPLASTY Left 09/25/2016  Procedure: LEFT TOTAL KNEE ARTHROPLASTY;  Surgeon: Paralee Cancel, MD;  Location: [facility];  Service: Orthopedics;  Laterality: Left;  . TUBAL LIGATION  1980's   Family History  Problem Relation Age of Onset  . Malignant hyperthermia Father   . Hypertension Father   . Heart disease Father   . Diabetes Father   . Cancer Father        skin  . Hypertension Mother   . Heart disease Mother   . Multiple myeloma Mother   . Cancer Sister        CERVICAL  . Hypertension Sister   . Cancer Brother 22       MELANOMA  . Heart disease Maternal Grandmother   . Other Maternal Grandmother 32       complications of childbirth  . Heart disease Maternal Grandfather   . Cancer Paternal Grandmother        ?   Marland Kitchen Heart disease Paternal Grandmother   . Heart disease Paternal Grandfather   . Cancer Brother        LUNG  . Diabetes Sister   . Hypertension Sister   . Heart disease Sister   . Cancer Sister   . Cancer Brother   . Pancreatic cancer Niece 49  . Cancer Nephew 40       unknown- currently in the [employer]  . Anesthesia problems Neg Hx   . Hypotension Neg Hx   . Pseudochol deficiency Neg Hx   . Colon cancer Neg Hx   . Esophageal cancer Neg Hx   . Rectal cancer Neg Hx   . Stomach cancer Neg Hx   . Breast cancer Neg Hx    Social History   Socioeconomic History  . Marital status: Married    Spouse name: Not on  file  . Number of children: 3  . Years of education: 16  . Highest education level: Some college, no degree  Occupational History  . Occupation: Retired Therapist, sports  Tobacco Use  . Smoking status: Never Smoker  . Smokeless tobacco: Never Used  Substance and Sexual Activity  . Alcohol use: No    Alcohol/week: 0.0 standard drinks  . Drug use: No  . Sexual activity: Not Currently    Birth control/protection: Surgical    Comment: 1st intercourse 64 yo-Fewer than 5 partners  Other Topics Concern  . Not on file  Social History Narrative   Right handed   One story home   3 children   Has custody of 2 grandchildren ages 64 & 42    Social Determinants of Health   Financial Resource Strain: High Risk  . Difficulty of Paying Living Expenses: Very hard  Food Insecurity: Food Insecurity Present  . Worried About Charity fundraiser in the Last Year: Sometimes true  . Ran Out of Food in the Last Year: Sometimes true  Transportation Needs: No Transportation Needs  . Lack of Transportation (Medical): No  . Lack of Transportation (Non-Medical): No  Physical Activity: Inactive  . Days of Exercise per Week: 0 days  . Minutes of Exercise per Session: 0 min  Stress: Stress Concern Present  . Feeling of Stress : Very much  Social Connections: Unknown  . Frequency of Communication with Friends and Family: More than three times a week  . Frequency of Social Gatherings with Friends and Family: Three times a week  . Attends Religious Services: Not on file  . Active Member of Clubs or Organizations: Not on file  .  Attends Archivist Meetings: Not on file  . Marital Status: Married    Outpatient Encounter Medications as of 12/31/2019  Medication Sig  . albuterol (PROVENTIL) (2.5 MG/3ML) 0.083% nebulizer solution Take 3 mLs (2.5 mg total) by nebulization every 6 (six) hours as needed for wheezing or shortness of breath (J45.40). (Patient taking differently: Take 2.5 mg by nebulization every 6 (six)  hours as needed for wheezing or shortness of breath. )  . albuterol (VENTOLIN HFA) 108 (90 Base) MCG/ACT inhaler Inhale 2 puffs into the lungs every 6 (six) hours as needed for wheezing or shortness of breath.  Marland Kitchen aspirin EC 81 MG tablet Take 81 mg at bedtime by mouth.   Marland Kitchen atorvastatin (LIPITOR) 40 MG tablet Take 1 tablet (40 mg total) by mouth at bedtime.  . Blood Glucose Monitoring Suppl (ONE TOUCH ULTRA 2) w/Device KIT USE TO CHECK BLOOD SUGAR 2 TIMES A DAY  . budesonide-formoterol (SYMBICORT) 80-4.5 MCG/ACT inhaler INHALE 2 PUFFS INTO THE LUNGS EVERY MORNING AND ANOTHER 2 PUFFS 12 HOURS LATER (Patient taking differently: Inhale 2 puffs into the lungs 2 (two) times daily. )  . Calcium Carbonate-Vitamin D 600-200 MG-UNIT TABS Take 1 tablet by mouth daily.  Marland Kitchen CINNAMON PO Take 1,000 mg 2 (two) times daily by mouth.  . clobetasol cream (TEMOVATE) 0.05 % Apply topically 2 (two) times daily. (Patient taking differently: Apply topically daily as needed (rash). )  . doxepin (SINEQUAN) 10 MG capsule Take 20 mg by mouth at bedtime.   Marland Kitchen esomeprazole (NEXIUM) 40 MG capsule Take 1 capsule (40 mg total) by mouth 2 (two) times daily. (Patient taking differently: Take 80 mg by mouth 2 (two) times daily. )  . famotidine (PEPCID) 20 MG tablet Take 1 tablet (20 mg total) by mouth 2 (two) times daily as needed for heartburn or indigestion.  . fluticasone (FLONASE) 50 MCG/ACT nasal spray Place 2 sprays at bedtime as needed into both nostrils for allergies or rhinitis.   . furosemide (LASIX) 20 MG tablet TAKE 1-2 TABLETS BY MOUTH DAILY AS DIRECTED.  Marland Kitchen glipiZIDE (GLUCOTROL XL) 5 MG 24 hr tablet TAKE 1 TABLET BY MOUTH EVERY DAY WITH BREAKFAST (Patient taking differently: Take 5 mg by mouth daily with breakfast. )  . HYDROcodone-acetaminophen (NORCO) 10-325 MG tablet Take 1 tablet by mouth every 6 (six) hours as needed. (Patient not taking: Reported on 12/31/2019)  . L-Methylfolate (DEPLIN) 7.5 MG TABS Take 7.5 mg by mouth  daily with breakfast.   . Lancets (ONETOUCH DELICA PLUS [id number]) MISC USE TO CHECK BLOOD SUGAR 2 TIMES A DAY  . LYRICA 150 MG capsule Take 150 mg by mouth 3 (three) times daily.   . metFORMIN (GLUCOPHAGE) 500 MG tablet TAKE 1 TABLET (500 MG TOTAL) BY MOUTH 2 (TWO) TIMES DAILY WITH A MEAL.  Marland Kitchen Misc Natural Products (GLUCOS-CHONDROIT-MSM COMPLEX PO) Take 1 tablet 3 (three) times daily by mouth.   . montelukast (SINGULAIR) 10 MG tablet TAKE 1 TABLET BY MOUTH EVERYDAY AT BEDTIME (Patient taking differently: Take 10 mg by mouth at bedtime. )  . Multiple Vitamin (MULITIVITAMIN WITH MINERALS) TABS Take 1 tablet by mouth daily.    . mupirocin cream (BACTROBAN) 2 % Apply once daily to lesions on feet as needed.  . nitroGLYCERIN (NITROSTAT) 0.4 MG SL tablet Place 0.4 mg under the tongue every 5 (five) minutes as needed for chest pain.   Marland Kitchen ondansetron (ZOFRAN) 4 MG tablet Take 4 mg by mouth every 8 (eight) hours as needed for  nausea or vomiting.  Glory Rosebush ULTRA test strip USE TO CHECK BLOOD SUGAR 2 TIMES A DAY  . Oxycodone HCl 20 MG TABS Take 20 mg every 4 (four) hours as needed by mouth (pain).   . OXYGEN Inhale 2 L into the lungs at bedtime as needed (shortness of breath).   . potassium chloride (K-DUR) 10 MEQ tablet TAKE 2 TABLETS BY MOUTH EVERY DAY (Patient taking differently: Take 20 mEq by mouth daily. )  . promethazine (PHENERGAN) 25 MG tablet Take 1 tablet (25 mg total) by mouth every 6 (six) hours as needed for nausea or vomiting. (Patient taking differently: Take 25 mg by mouth at bedtime. Additional as needed)  . sucralfate (CARAFATE) 1 g tablet Take 1 tablet (1 g total) by mouth 4 (four) times daily -  with meals and at bedtime. Before taking slowly dissolve tablet in 1 Tablespoon of distilled water for about 15 minutes to creat a slurry. (Patient taking differently: Take 1 g by mouth 4 (four) times daily. Before taking slowly dissolve tablet in 1 Tablespoon of distilled water for about 15 minutes  to creat a slurry.)  . tiZANidine (ZANAFLEX) 4 MG tablet Take 4 mg by mouth every 8 (eight) hours as needed for muscle spasms.  . vitamin B-12 (CYANOCOBALAMIN) 500 MCG tablet Take 500 mcg by mouth daily.   . Vitamins/Minerals TABS Take by mouth.   No facility-administered encounter medications on file as of 12/31/2019.    Activities of Daily Living In your present state of health, do you have any difficulty performing the following activities: 12/08/2019 11/04/2019  Hearing? N N  Vision? N N  Difficulty concentrating or making decisions? N N  Walking or climbing stairs? Y N  Comment due to pain in back and legs -  Dressing or bathing? N N  Doing errands, shopping? N N  Preparing Food and eating ? - N  Using the Toilet? - N  In the past six months, have you accidently leaked urine? - N  Do you have problems with loss of bowel control? - N  Managing your Medications? - N  Managing your Finances? - N  Housekeeping or managing your Housekeeping? - N  Some recent data might be hidden    Patient Care Team: Caren Macadam, MD as PCP - General (Family Medicine) Rigoberto Noel, MD as Consulting Physician (Pulmonary Disease) Adrian Prows, MD as Consulting Physician (Cardiology) Lenon Oms, MD as Referring Physician (Obstetrics and Gynecology) Veneda Melter, MD as Referring Physician (Cardiology) [name], MD as Consulting Physician (Anesthesiology) Terrance Mass, MD (Inactive) as Consulting Physician (Gynecology) Rockwell Germany, RN as Oncology Nurse Navigator Mauro Kaufmann, RN as Oncology Nurse Navigator Rolm Bookbinder, MD as Consulting Physician (General Surgery) Nicholas Lose, MD as Consulting Physician (Hematology and Oncology) Kyung Rudd, MD as Consulting Physician (Radiation Oncology) Alda Berthold, DO as Consulting Physician (Neurology) Jarome Matin, MD as Consulting Physician (Dermatology) Valente David, RN as [place]  Management    Assessment:   This is a routine wellness examination for Gregory.  Exercise Activities and Dietary recommendations    Goals    . Community resources     Find counseling services, medication assistance, grocery assistance, and financial assistance for medical bills       Fall Risk Fall Risk  12/31/2019 12/25/2019 11/04/2019 09/14/2019 07/27/2019  Falls in the past year? 1 - '1 1 1  ' Number falls in past yr: 1 0 1 0 1  Injury with Fall? 1 1 0 0 0  Comment - - - - Per patient she fell bruising elbow  Risk Factor Category  - - - - -  Risk for fall due to : History of fall(s);Medication side effect;Other (Comment) Impaired balance/gait History of fall(s) - History of fall(s);Impaired balance/gait;Impaired mobility  Risk for fall due to: Comment dizziness and recent cancer diagnosis - - - -  Follow up Falls evaluation completed;Education provided;Falls prevention discussed Falls evaluation completed - - Falls evaluation completed;Education provided;Falls prevention discussed  Comment - - - - -   Is the patient's home free of loose throw rugs in walkways, pet beds, electrical cords, etc?   yes      Grab bars in the bathroom? no      Handrails on the stairs?   yes      Adequate lighting?   yes  Timed Get Up and Go performed: N/A due to telephone visit  Depression Screen PHQ 2/9 Scores 12/31/2019 11/04/2019 07/27/2019 04/09/2019  PHQ - 2 Score '4 3 4 2  ' PHQ- 9 Score '15 11 12 8     ' Cognitive Function        Immunization History  Administered Date(s) Administered  . Influenza Split 08/19/2011  . Influenza Whole 01/03/2013  . Influenza,inj,Quad PF,6+ Mos 08/20/2013, 09/15/2014, 08/26/2015, 08/01/2016, 08/22/2017, 10/15/2018, 09/24/2019  . Influenza-Unspecified 08/03/2016  . Pneumococcal Polysaccharide-23 08/19/2011, 12/12/2017  . Tdap 06/28/2014    Qualifies for Shingles Vaccine?yes  Screening Tests Health Maintenance  Topic Date Due  . URINE MICROALBUMIN  07/25/2019    . OPHTHALMOLOGY EXAM  04/14/2020 (Originally 07/23/2019)  . HEMOGLOBIN A1C  04/29/2020  . FOOT EXAM  12/24/2020  . COLONOSCOPY  12/29/2020  . PAP SMEAR-Modifier  08/21/2021  . MAMMOGRAM  08/24/2021  . TETANUS/TDAP  06/28/2024  . INFLUENZA VACCINE  Completed  . PNEUMOCOCCAL POLYSACCHARIDE VACCINE AGE 97-64 HIGH RISK  Completed  . HIV Screening  Completed  . Hepatitis C Screening  Addressed    Cancer Screenings: Lung: Low Dose CT Chest recommended if Age 81-80 years, 30 pack-year currently smoking OR have quit w/in 15years. Patient does not qualify. Breast:  Up to date on Mammogram? Yes   Up to date of Bone Density/Dexa? Yes Colorectal: yes  Additional Screenings:  Hepatitis C Screening: completed 11/03/2015.  Plan:   Mrs. Peaden is under a tremendous amount of stress at this time due to recent diagnosis of left breast cancer. Internal referral made to [facility] care team to help with finding resources to assist with medical bills, medication costs, food/groceries. External referrals made to audiology for decreased hearing and to behavioral health for increased depression per patient report. Samples of glucerna were provided to patient to pick up from office as well.    I have personally reviewed and noted the following in the patient's chart:   . Medical and social history . Use of alcohol, tobacco or illicit drugs  . Current medications and supplements . Functional ability and status . Nutritional status . Physical activity . Advanced directives . List of other physicians . Hospitalizations, surgeries, and ER visits in previous 12 months . Vitals . Screenings to include cognitive, depression, and falls . Referrals and appointments  In addition, I have reviewed and discussed with patient certain preventive protocols, quality metrics, and best practice recommendations. A written personalized care plan for preventive services as well as general preventive health recommendations  were provided to patient.     Franne Forts, LPN  [id number]/50/[id number]

## 2019-12-31 NOTE — Therapy (Signed)
[place], [place], [address] Phone: [phone]   Fax:  [phone]  Physical Therapy Evaluation  Patient Details  Name: Shelby Anderson MRN: [id number] Date of Birth: 07-Apr-1956 Referring Provider (PT): Rolm Bookbinder MD   Encounter Date: 12/31/2019  PT End of Session - 12/31/19 1207    Visit Number  1    Number of Visits  3    Date for PT Re-Evaluation  01/28/20    PT Start Time  1007    PT Stop Time  1100    PT Time Calculation (min)  53 min    Activity Tolerance  Patient tolerated treatment well    Behavior During Therapy  [facility] for tasks assessed/performed       Past Medical History:  Diagnosis Date  . Allergy   . Anemia    "chronic"  . Angina    Prinzmetal angina  . Anxiety and depression   . Arthritis   . Asthma   . Atrial fibrillation ([place])    h/o "AF w/frequent PVCs"  . Breast cancer ([place])    Left  . Cancer (HCC)    hx of skin cancer   . CHF (congestive heart failure) ([place])   . Chronic back pain greater than 3 months duration    on chronic narcotics, treated at pain clinic  . Colon polyps    hyperplastic  . Coronary artery disease    Arrythmia, orthostatic hypotension, HLD, HTN; sees Dr. Einar Gip  . Difficult intubation    "TMJ & woke up when they were still cutting on me"  . Dysrhythmia    sees Dr. Einar Gip and a cardiologist at [facility]  . Esophageal stricture   . Family history of melanoma ]  . Family history of pancreatic cancer   . Fatty liver   . Fatty liver   . Fibroids   . Fibromyalgia    "in my legs"  . GERD (gastroesophageal reflux disease)    hx hiatal hernia, stricture and gastric ulcer  . Headache(784.0)    migraines  . Heart murmur   . Hiatal hernia   . History of loop recorder   . History of migraines    "dx'd when I was in my teens"  . Hyperlipemia   . Hypertension   . Mental disorder   . Mild episode of recurrent major depressive disorder  ([place]) 12/06/2015  . Myocardial infarction [facility]) 1980's & 1990;   sees Dr. Einar Gip  . OSA (obstructive sleep apnea)   . Pneumonia    multiple times  . PONV (postoperative nausea and vomiting)   . Recurrent upper respiratory infection (URI)   . Shortness of breath 11/20/11   "all the time", sees pulmonlogy, ? asthma  . Stenotic cervical os   . Stomach ulcer    "3 small; found in 05/2011"  . TMJ (dislocation of temporomandibular joint)   . Tuberculosis    + TB SKIN TEST  . Type 2 diabetes mellitus without complication, without long-term current use of insulin ([place]) 12/06/2015   not on meds     Past Surgical History:  Procedure Laterality Date  . ACHILLES TENDON REPAIR  1970's   left ankle  . ARTHROSCOPIC REPAIR ACL     left knee cap  . BREAST BIOPSY Right 04/08/2013   again in October or November 2020  . BREAST LUMPECTOMY WITH RADIOACTIVE SEED AND SENTINEL LYMPH NODE BIOPSY Left 12/09/2019   Procedure: LEFT BREAST LUMPECTOMY WITH RADIOACTIVE  SEED AND LEFT AXILLARY SENTINEL LYMPH NODE BIOPSY;  Surgeon: Rolm Bookbinder, MD;  Location: [facility];  Service: General;  Laterality: Left;  PEC BLOCK  . CARDIAC CATHETERIZATION     loop recorder  . CARPAL TUNNEL RELEASE  unknown   left hand  . COLONOSCOPY    . ESOPHAGOGASTRODUODENOSCOPY (EGD) WITH PROPOFOL N/A 10/29/2017   Procedure: ESOPHAGOGASTRODUODENOSCOPY (EGD) WITH PROPOFOL;  Surgeon: Irene Shipper, MD;  [facility]: [facility];  Service: Endoscopy;  Laterality: N/A;  . LOOP RECORDER IMPLANT    . PORT-A-CATH REMOVAL N/A 12/09/2019   Procedure: REMOVAL PORT-A-CATH;  Surgeon: Rolm Bookbinder, MD;  Location: [facility];  Service: General;  Laterality: N/A;  . PORTACATH PLACEMENT N/A 09/23/2019   Procedure: INSERTION PORT-A-CATH Right Internal Doreen Salvage;  Surgeon: Rolm Bookbinder, MD;  Location: [facility];  Service: General;  Laterality: N/A;  . post ganglionectomy  1970's   "for migraine headaches"  . pouch string  [phone]   "did  this 3 times (once w/each pregnancy)"  . SAVORY DILATION N/A 10/29/2017   Procedure: SAVORY DILATION;  Surgeon: Irene Shipper, MD;  Location: [facility];  Service: Endoscopy;  Laterality: N/A;  . TOTAL KNEE ARTHROPLASTY Left 09/25/2016   Procedure: LEFT TOTAL KNEE ARTHROPLASTY;  Surgeon: Paralee Cancel, MD;  Location: [facility];  Service: Orthopedics;  Laterality: Left;  . TUBAL LIGATION  1980's    There were no vitals filed for this visit.   Subjective Assessment - 12/31/19 1012    Subjective  Pt reports that she has not noticed any significant changes in her shoulder since her surgery. She states that she has had pain in her Bil shoulders for years and preivously tried to go to physical therapy but was unable to do the exercises due to pain. She was referred to an orthopedist but never had an appointment. She states that she has not noticed any increased swelling in her arm and states she has a little swelling in her L breast. She is no longer wearing the post-operative bra and is able to wear a regular bra. She states that it took her some time to put on a regular bra due to rubbing on her incision site but is able to wear it now. She saw her MD on Monday and he stated that her incision looks good. Her steristrips have not come off completely at this time.    Pertinent History  lumpectomy on 12/09/2019 with 1 lymph node removal that was negative for findings.2.5 mm foci of DCIS high-grade, resection margins negative, ER 0%, PR 0%, HER-2 negative, Ki-67 20% on the final path, 0/1 lymph node negative    Patient Stated Goals  I want to be able to get into position for radiation.    Currently in Pain?  Yes    Pain Score  6     Pain Location  Breast    Pain Orientation  Left    Pain Descriptors / Indicators  Aching;Burning    Pain Type  Acute pain    Pain Onset  1 to 4 weeks ago    Pain Frequency  Intermittent    Aggravating Factors   movement, walking    Pain Relieving Factors  pressure    Effect  of Pain on Daily Activities  pt reports she is still doing everything she just pushes through the pain    Multiple Pain Sites  Yes    Pain Score  7    Pain Location  Shoulder  Pain Orientation  Right;Left    Pain Descriptors / Indicators  Stabbing;Sharp;Aching    Pain Type  Chronic pain    Pain Radiating Towards  shoulder blades, occasionally finger tips    Pain Onset  More than a month ago    Pain Frequency  Constant    Aggravating Factors   movemet, reaching above the head    Pain Relieving Factors  ice, rest, limiting movement and pain medication         [facility] PT Assessment - 12/31/19 0001      Assessment   Medical Diagnosis  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  3    Has the patient had a decrease in activity level because of a fear of falling?   Yes    Is the patient reluctant to leave their home because of a fear of falling?   No      Home Film/video editor residence    Living Arrangements  Spouse/significant other;Other relatives    Type of [place] to enter;Ramped entrance    Entrance Stairs-Number of Steps  4    Entrance Stairs-Rails  None    Home Layout  Two level    Alternate Level Stairs-Number of Steps  6    Alternate Level Stairs-Rails  None   patient holds onto the wall      Prior Function   Level of Independence  Independent with gait;Independent with transfers    Vocation  Retired;On disability    Leisure  sewing      Cognition   Overall Cognitive Status  Within Functional Limits for tasks assessed       Posture/Postural Control   Posture/Postural Control  Postural limitations    Postural Limitations  Rounded Shoulders;Forward head      ROM / Strength   AROM / PROM / Strength  AROM      AROM   AROM Assessment Site  Shoulder    Right/Left Shoulder  Right;Left    Right Shoulder Flexion  143 Degrees    Right Shoulder ABduction  121 Degrees    Right Shoulder Internal Rotation  68 Degrees    Right Shoulder External Rotation  42 Degrees    Left Shoulder Flexion  152 Degrees    Left Shoulder ABduction  130 Degrees    Left Shoulder Internal Rotation  82 Degrees    Left Shoulder External Rotation  80 Degrees        LYMPHEDEMA/ONCOLOGY QUESTIONNAIRE - 12/31/19 1038      Type   Cancer Type  L breast cancer       Surgeries   Lumpectomy Date  12/09/19    Sentinel Lymph Node Biopsy Date  12/09/19    Number Lymph Nodes Removed  1      Treatment   Active Chemotherapy Treatment  No    Past Chemotherapy Treatment  Yes    Date  10/15/19    Active Radiation Treatment  No    Past Radiation Treatment  No    Current Hormone Treatment  No    Past Hormone Therapy  No      What other symptoms do you have   Are you Having Heaviness or Tightness  Yes    Are you having Pain  Yes    Are you having pitting edema  No    Is it Hard or Difficult finding clothes that fit  No    Do you have infections  No    Is there Decreased scar mobility  Yes      Lymphedema Assessments   Lymphedema Assessments  Upper extremities      Right Upper Extremity Lymphedema   15 cm Proximal to Olecranon Process  35.8 cm    15 cm Proximal to Ulnar Styloid Process  25.8 cm    Just Proximal to Ulnar Styloid Process  16.3 cm    Across Hand at [employer]  18.8 cm    At [place] of 2nd Digit  6.1 cm      Left Upper Extremity Lymphedema   15 cm Proximal to Olecranon Process  35.8 cm    15 cm Proximal to Ulnar Styloid Process  25.5 cm    Just Proximal to Ulnar Styloid Process  16 cm    Across Hand at [employer]   18 cm    At [place] of 2nd Digit  5.8 cm          Quick Dash - 12/31/19 0001    Open a tight or new jar  Moderate difficulty    Do heavy household chores (wash walls, wash floors)  Moderate difficulty    Carry a shopping bag or briefcase  Mild difficulty    Wash your back  Moderate difficulty    Use a knife to cut food  No difficulty    Recreational activities in which you take some force or impact through your arm, shoulder, or hand (golf, hammering, tennis)  Severe difficulty    During the past week, to what extent has your arm, shoulder or hand problem interfered with your normal social activities with family, friends, neighbors, or groups?  Slightly    During the past week, to what extent has your arm, shoulder or hand problem limited your work or other regular daily activities  Slightly    Arm, shoulder, or hand pain.  Mild    Tingling (pins and needles) in your arm, shoulder, or hand  Mild    Difficulty Sleeping  Severe difficulty    DASH Score  38.64 %        Outpatient Rehab from 12/31/2019 in [place]  Lymphedema Life Impact Scale Total Score  11.76 %      Objective measurements completed on examination: See above findings.              PT Education - 12/31/19 1206    Education Details  Pt was educated on risk of lymphedema following lumpectomy and lymph node biopsy as well as increased risk for lymphedema following radiation therapy. Pt was educated on the lymphatic system. Discussed risk reduction practices and signs/symptoms of lymphedema including heaviness, aching and increased fluid in the L breast/LUE. Discussed POC with patient.    Person(s) Educated  Patient    Methods  Explanation;Handout    Comprehension  Verbalized understanding  PT Short Term Goals - 12/31/19 1215      PT SHORT TERM GOAL #1   Title  Pt will be able to identify 3 risk reduction practices for lymphedema in order to demonstrate understanding  of education.    Baseline  pt does not know risk reduction practices    Time  4    Period  Weeks    Status  New    Target Date  01/28/20        PT Long Term Goals - 12/31/19 1216      PT LONG TERM GOAL #1   Title  Pt will be able to reach 150 degrees R shoulder flexion/abduction and L shoulder abduction to demonstrate improved functional ROM with pain 2/10 or less.    Baseline  R shoulder flexoin: 143, abduction:  121, L shoulder abduction: 130    Time  4    Period  Weeks    Status  New    Target Date  01/28/20      PT LONG TERM GOAL #2   Title  Pt will report pain 4/10 at the worst with functional ues of BIl shoulders and 1/10 or less at the L breast in order to demonstrate an improved quality of life.    Baseline  7/10 at Bil shoulders, 5/10 at L breast    Time  4    Period  Weeks    Status  New    Target Date  01/28/20      PT LONG TERM GOAL #3   Title  Pt will demonstrate 25% orr less disability with the DASH to demonstrate improved functional BIL UE mobility.    Baseline  38.64    Time  4    Period  Weeks    Status  New    Target Date  01/28/20             Plan - 12/31/19 1208    Clinical Impression Statement  Pt presents to physical therapy post lumpectomy with 1 lymphnode biopsy. She has chronic Bil shoulder pain that has been bothering her for years. Pt demonstrates no significant different between Bil UE with circumferential measurement. She demonstrates decreased abduction Bil with the R shoulder demonstrating greater loss of functional ROM than the L. The L shoulder has adequate ROM for radiation therapy but pt does report pain with movement which she reports has been going on for years and was unrelieved by physical therapy previously. Pt incision is healing well with no signs/symptoms of infection including redness, exudate or pain/tenderness. She does report some soreness over the incision site that feels better with compresion; pt was provided with gray 1/2  inch foam to apply pressure to this area for pain relief and was provided with a scipt for compression bra as well as information for DME store in the area. Pt was provided with hand outs for risk reduction practices for lymphedema and handout for strength ABC class. Pt was educated that if she is unable to tolerat her radiation appointment on 01/05/20 she will return to physical therapy to see if we are able to help her gain increased pain-free ROM to tolerate this position. Otherwise pt will return to physical therapy only if she notices any signs/symptoms of increased edema. Pt will benefit from skilled physical therapy services if she is unable to tolerably hold her position needed for radiation therapy or has an increased in edema.    Personal Factors and Comorbidities  Comorbidity 2  Comorbidities  Chronic shoulder pain, L lumpectomy/lymph node biopsy.    Stability/Clinical Decision Making  Stable/Uncomplicated    Clinical Decision Making  Low    Rehab Potential  Fair    PT Frequency  1x / week    PT Duration  3 weeks    PT Treatment/Interventions  Iontophoresis 66m/ml Dexamethasone;Therapeutic exercise;Neuromuscular re-education;Manual techniques;Therapeutic activities;Patient/family education    PT Next Visit Plan  if pt returns light STM, easy ROM activities    PT Home Exercise Plan  pt will continue with post op ROM activities she was provided by MD and will wear 1/2 inch gray foam over incision area.    Recommended Other Services  Compression bra and prophylactic sleeve. Pt was provided with script for MD to sign.    Consulted and Agree with Plan of Care  Patient       Patient will benefit from skilled therapeutic intervention in order to improve the following deficits and impairments:  Pain, Decreased range of motion  Visit Diagnosis: Malignant neoplasm of upper-outer quadrant of left breast in female, estrogen receptor negative ([place] - Plan: PT plan of care cert/re-cert  Abnormal  posture - Plan: PT plan of care cert/re-cert  Chronic left shoulder pain - Plan: PT plan of care cert/re-cert  Stiffness of left shoulder, not elsewhere classified - Plan: PT plan of care cert/re-cert  Chronic right shoulder pain - Plan: PT plan of care cert/re-cert  Stiffness of right shoulder, not elsewhere classified - Plan: PT plan of care cert/re-cert     Problem List Patient Active Problem List   Diagnosis Date Noted  . Acute on chronic diastolic CHF (congestive heart failure) ([place] 10/01/2019  . Neutropenia with fever ([place] 10/01/2019  . Genetic testing 09/17/2019  . Family history of melanoma   . Family history of pancreatic cancer   . Malignant neoplasm of upper-outer quadrant of left breast in female, estrogen receptor negative ([place] 09/07/2019  . Encounter for loop recorder at end of battery life 04/24/2018  . Esophageal stricture 07/01/2017  . Hyperlipidemia associated with type 2 diabetes mellitus ([place] 05/20/2017  . Allergic rhinitis 03/11/2017  . Dilated cardiomyopathy ([place] 02/07/2017  . 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 Morbid obesity due to excess calories ([place] 09/26/2016  . S/P left TKA 09/25/2016  . Increased endometrial stripe thickness 06/27/2016  . Intramural leiomyoma of uterus 06/27/2016  . Type 2 diabetes mellitus without complication, without long-term current use of insulin ([place] 12/06/2015  . Chronic respiratory failure ([place] 09/15/2015  . Hypertension associated with diabetes ([place] 07/19/2015  . Arrhythmia 07/19/2015  . Orthostatic hypotension 07/19/2015  . GERD (gastroesophageal reflux disease) 07/19/2015  . Chronic back pain 07/19/2015  . Neuropathy 07/19/2015  . Symptomatic PVCs 11/02/2014  . Syncope 11/02/2014  . Chest pain 12/23/2013  . Disorder of cervix 03/10/2013  . Vaginal atrophy 03/10/2013  . OSA (obstructive sleep apnea) 07/31/2012  . Coronary artery disease 11/20/2011    CAnder Purpura  PT 12/31/2019, 12:21 PM  [place] [address]Phone: [phone]  Fax:  [phone] Name: Shelby LANZOMRN: [id number] of Birth: [birth year]

## 2020-01-01 NOTE — Progress Notes (Signed)
Subjective: Shelby Anderson presents today for follow up of preventative diabetic foot care with and painful mycotic nails b/l that are difficult to trim. Pain interferes with ambulation. Aggravating factors include wearing enclosed shoe gear. Pain is relieved with periodic professional debridement.   Shelby Anderson was diagnosed with breast cancer in October, 2020. She has had lumpectomy and chemotherapy. She is about to start radiation soon.   She states her right great and 3rd toe have come off.   Allergies  Allergen Reactions  . Lodine [Etodolac] Anaphylaxis, Hives and Swelling  . Oxycontin [Oxycodone Hcl] Anaphylaxis    hives, trouble breathing, tongue swelling (Only Oxycontin) Tolerates plain oxycodone.  Marland Kitchen Penicillins Anaphylaxis    Told by a surgeon never to take it again. Has patient had a PCN reaction causing immediate rash, facial/tongue/throat swelling, SOB or lightheadedness with hypotension: Yes Has patient had a PCN reaction causing severe rash involving mucus membranes or skin necrosis: Unknown Has patient had a PCN reaction that required hospitalization: No Has patient had a PCN reaction occurring within the last 10 years: No If all of the above answers are "NO", then may proceed with Cephalosporin use.  . Aspirin Other (See Comments)    High-dose caused GI Bleeds  . Darvocet [Propoxyphene N-Acetaminophen] Hives  . Nitroglycerin Other (See Comments)    IV-BP drops dramatically Can take po  . Tramadol Hives and Itching  . Ultram [Tramadol Hcl] Hives  . Valium Other (See Comments)    Circulation problems. "Legs turned black".     Objective: There were no vitals filed for this visit.  Vascular Examination:  Capillary refill time to digits immediate b/l, faintly palpable DP pulses b/l, non-palpable PT pulses b/l, pedal hair absent b/l and skin temperature gradient within normal limits b/l  Dermatological Examination: Pedal skin with normal turgor, texture and  tone bilaterally, no open wounds bilaterally, no interdigital macerations bilaterally and toenails 2, 4, 5 right and 1-5 left elongated, dystrophic, thickened, crumbly with subungual debris.  Anonychia right great toe and right 3rd toe(s). Nailbed(s) completely epithelialized and intact. No erythema, no edema, no drainage, no flocculence.  Musculoskeletal: Normal muscle strength 5/5 to all lower extremity muscle groups bilaterally, no gross bony deformities bilaterally and no pain crepitus or joint limitation noted with ROM b/l  Neurological: Protective sensation intact 5/5 intact bilaterally with 10g monofilament b/l  Assessment: 1. Onychomycosis   2. Diabetes mellitus type 2 with peripheral artery disease ([place])   3. Onycholysis of toenail      Plan: -Continue diabetic foot care principles. Literature dispensed on today.  -Prescription written for Mupirocin Ointment. Patient is to apply to affected digits once daily until healed prn when toenails fall off. -Toenails 2, 4, 5 right and 1-5 left were debrided in length and girth without iatrogenic bleeding. -Patient to continue soft, supportive shoe gear daily. -Patient to report any pedal injuries to medical professional immediately. -Patient/POA to call should there be question/concern in the interim.  Return in about 3 months (around 03/24/2020) for diabetic nail trim.

## 2020-01-05 ENCOUNTER — Ambulatory Visit
Admission: RE | Admit: 2020-01-05 | Discharge: 2020-01-05 | Disposition: A | Discharge status: Home / Self Care | Payer: Medicare Other | Source: Ambulatory Visit | Attending: Radiation Oncology | Admitting: Radiation Oncology

## 2020-01-05 ENCOUNTER — Other Ambulatory Visit: Payer: Self-pay

## 2020-01-05 DIAGNOSIS — Z510 Encounter for antineoplastic radiation therapy: Secondary | ICD-10-CM | POA: Insufficient documentation

## 2020-01-05 DIAGNOSIS — Z171 Estrogen receptor negative status [ER-]: Secondary | ICD-10-CM | POA: Diagnosis not present

## 2020-01-05 DIAGNOSIS — C50412 Malignant neoplasm of upper-outer quadrant of left female breast: Secondary | ICD-10-CM | POA: Insufficient documentation

## 2020-01-06 ENCOUNTER — Telehealth: Payer: Self-pay

## 2020-01-06 NOTE — Telephone Encounter (Signed)
01/06/2020 Spoke with patient about Doctor, hospital Grandparents raising grandchildren and [employer]. Ambrose Mantle [phone]

## 2020-01-11 ENCOUNTER — Other Ambulatory Visit: Payer: Self-pay | Admitting: *Deleted

## 2020-01-11 ENCOUNTER — Encounter: Payer: Self-pay | Admitting: *Deleted

## 2020-01-11 DIAGNOSIS — S9422XS Injury of deep peroneal nerve at ankle and foot level, left leg, sequela: Secondary | ICD-10-CM | POA: Diagnosis not present

## 2020-01-11 DIAGNOSIS — M47816 Spondylosis without myelopathy or radiculopathy, lumbar region: Secondary | ICD-10-CM | POA: Diagnosis not present

## 2020-01-11 DIAGNOSIS — G894 Chronic pain syndrome: Secondary | ICD-10-CM | POA: Diagnosis not present

## 2020-01-11 DIAGNOSIS — Z79891 Long term (current) use of opiate analgesic: Secondary | ICD-10-CM | POA: Diagnosis not present

## 2020-01-11 NOTE — Patient Outreach (Signed)
[facility]) Care Management  01/11/2020  Shelby Anderson 08/24/1956 [id number]   Call placed to member to follow up on breast cancer treatments, no answer.  Unable to leave voice message, will follow up within the next 3-4 business days.  Valente David, [place], MSN [place] [phone]

## 2020-01-12 ENCOUNTER — Ambulatory Visit: Payer: Medicare Other | Admitting: *Deleted

## 2020-01-12 ENCOUNTER — Telehealth: Payer: Self-pay | Admitting: Hematology and Oncology

## 2020-01-12 ENCOUNTER — Other Ambulatory Visit: Payer: Self-pay

## 2020-01-12 ENCOUNTER — Ambulatory Visit
Admission: RE | Admit: 2020-01-12 | Discharge: 2020-01-12 | Disposition: A | Discharge status: Home / Self Care | Payer: Medicare Other | Source: Ambulatory Visit | Attending: Radiation Oncology | Admitting: Radiation Oncology

## 2020-01-12 DIAGNOSIS — C50412 Malignant neoplasm of upper-outer quadrant of left female breast: Secondary | ICD-10-CM | POA: Diagnosis not present

## 2020-01-12 DIAGNOSIS — Z171 Estrogen receptor negative status [ER-]: Secondary | ICD-10-CM | POA: Diagnosis not present

## 2020-01-12 DIAGNOSIS — Z510 Encounter for antineoplastic radiation therapy: Secondary | ICD-10-CM | POA: Diagnosis not present

## 2020-01-12 NOTE — Telephone Encounter (Signed)
I could not reach patient regarding 3/8 will mail

## 2020-01-13 ENCOUNTER — Other Ambulatory Visit: Payer: Self-pay | Admitting: Family Medicine

## 2020-01-13 ENCOUNTER — Ambulatory Visit
Admission: RE | Admit: 2020-01-13 | Discharge: 2020-01-13 | Disposition: A | Discharge status: Home / Self Care | Payer: Medicare Other | Source: Ambulatory Visit | Attending: Radiation Oncology | Admitting: Radiation Oncology

## 2020-01-13 ENCOUNTER — Other Ambulatory Visit: Payer: Self-pay

## 2020-01-13 DIAGNOSIS — C50412 Malignant neoplasm of upper-outer quadrant of left female breast: Secondary | ICD-10-CM | POA: Diagnosis not present

## 2020-01-13 DIAGNOSIS — Z510 Encounter for antineoplastic radiation therapy: Secondary | ICD-10-CM | POA: Diagnosis not present

## 2020-01-13 DIAGNOSIS — Z171 Estrogen receptor negative status [ER-]: Secondary | ICD-10-CM | POA: Diagnosis not present

## 2020-01-13 MED ORDER — POTASSIUM CHLORIDE ER 10 MEQ PO TBCR: 20.0000 meq | EXTENDED_RELEASE_TABLET | Freq: Every day | ORAL | 1 refills | Status: DC

## 2020-01-14 ENCOUNTER — Ambulatory Visit
Admission: RE | Admit: 2020-01-14 | Discharge: 2020-01-14 | Disposition: A | Discharge status: Home / Self Care | Payer: Medicare Other | Source: Ambulatory Visit | Attending: Radiation Oncology | Admitting: Radiation Oncology

## 2020-01-14 ENCOUNTER — Other Ambulatory Visit: Payer: Self-pay

## 2020-01-14 ENCOUNTER — Other Ambulatory Visit: Payer: Self-pay | Admitting: *Deleted

## 2020-01-14 DIAGNOSIS — Z171 Estrogen receptor negative status [ER-]: Secondary | ICD-10-CM | POA: Diagnosis not present

## 2020-01-14 DIAGNOSIS — Z510 Encounter for antineoplastic radiation therapy: Secondary | ICD-10-CM | POA: Diagnosis not present

## 2020-01-14 DIAGNOSIS — C50412 Malignant neoplasm of upper-outer quadrant of left female breast: Secondary | ICD-10-CM | POA: Diagnosis not present

## 2020-01-14 NOTE — Patient Outreach (Signed)
[facility]) Care Management  01/14/2020  JENASIS GASCA 1956/01/30 [id number]   Outreach attempt #2, unsuccessful.  Call placed to member to follow up on breast cancer treatments.  Female answering phone state she is asleep, not available to speak at this time.  Request for member to call this care manager back when she is available.  Will await call back, will send unsuccessful outreach letter and follow up within the next 3-4 business days if no call back.    Valente David, [place], MSN [place] [phone]

## 2020-01-15 ENCOUNTER — Ambulatory Visit
Admission: RE | Admit: 2020-01-15 | Discharge: 2020-01-15 | Disposition: A | Discharge status: Home / Self Care | Payer: Medicare Other | Source: Ambulatory Visit | Attending: Radiation Oncology | Admitting: Radiation Oncology

## 2020-01-15 ENCOUNTER — Other Ambulatory Visit: Payer: Self-pay

## 2020-01-15 DIAGNOSIS — C50412 Malignant neoplasm of upper-outer quadrant of left female breast: Secondary | ICD-10-CM | POA: Diagnosis not present

## 2020-01-15 DIAGNOSIS — Z171 Estrogen receptor negative status [ER-]: Secondary | ICD-10-CM | POA: Diagnosis not present

## 2020-01-15 DIAGNOSIS — Z510 Encounter for antineoplastic radiation therapy: Secondary | ICD-10-CM | POA: Diagnosis not present

## 2020-01-15 MED ORDER — SONAFINE EX EMUL
1.0000 "application " | Freq: Once | CUTANEOUS | Status: AC
  Administered 2020-01-15: 1 via TOPICAL

## 2020-01-15 MED ORDER — ALRA NON-METALLIC DEODORANT (RAD-ONC)
1.0000 "application " | Freq: Once | TOPICAL | Status: AC
  Administered 2020-01-15: 1 via TOPICAL

## 2020-01-15 NOTE — Progress Notes (Signed)
Pt here for patient teaching.  Pt given Radiation and You booklet, skin care instructions, [place] deodorant and Sonafine.  Reviewed areas of pertinence such as fatigue, hair loss, skin changes, breast tenderness and breast swelling . Pt able to give teach back of to pat skin and use unscented/gentle soap,apply Sonafine bid, avoid applying anything to skin within 4 hours of treatment, avoid wearing an under wire bra and to use an electric razor if they must shave. Pt verbalizes understanding of information given and will contact nursing with any questions or concerns.     Motty Borin M. Jacion Dismore RN, BSN             

## 2020-01-18 ENCOUNTER — Ambulatory Visit
Admission: RE | Admit: 2020-01-18 | Discharge: 2020-01-18 | Disposition: A | Discharge status: Home / Self Care | Payer: Medicare Other | Source: Ambulatory Visit | Attending: Radiation Oncology | Admitting: Radiation Oncology

## 2020-01-18 ENCOUNTER — Other Ambulatory Visit: Payer: Self-pay

## 2020-01-18 DIAGNOSIS — Z510 Encounter for antineoplastic radiation therapy: Secondary | ICD-10-CM | POA: Diagnosis not present

## 2020-01-18 DIAGNOSIS — C50412 Malignant neoplasm of upper-outer quadrant of left female breast: Secondary | ICD-10-CM | POA: Diagnosis not present

## 2020-01-18 DIAGNOSIS — Z171 Estrogen receptor negative status [ER-]: Secondary | ICD-10-CM | POA: Diagnosis not present

## 2020-01-19 ENCOUNTER — Ambulatory Visit
Admission: RE | Admit: 2020-01-19 | Discharge: 2020-01-19 | Disposition: A | Discharge status: Home / Self Care | Payer: Medicare Other | Source: Ambulatory Visit | Attending: Radiation Oncology | Admitting: Radiation Oncology

## 2020-01-19 ENCOUNTER — Other Ambulatory Visit: Payer: Self-pay | Admitting: *Deleted

## 2020-01-19 DIAGNOSIS — Z171 Estrogen receptor negative status [ER-]: Secondary | ICD-10-CM | POA: Diagnosis not present

## 2020-01-19 DIAGNOSIS — Z510 Encounter for antineoplastic radiation therapy: Secondary | ICD-10-CM | POA: Diagnosis not present

## 2020-01-19 DIAGNOSIS — C50412 Malignant neoplasm of upper-outer quadrant of left female breast: Secondary | ICD-10-CM | POA: Diagnosis not present

## 2020-01-19 NOTE — Patient Outreach (Signed)
[facility]) Care Management  01/19/2020  Shelby Anderson 1956-01-12 [id number]   Outreach #3, successful.  Call placed to member to follow up on breast cancer treatments.  She report she has been "hanging in there."  Completed first week of radiation last week, going for daily treatment for a total of 4-5 weeks.  She will have repeat scans once sessions are complete to determine tumor size.  State the only complication she has been experiencing is shortness of breath with lying flat for the treatment.  Also report she is now having to sleep on 2 pillows.  Denies having swelling in legs or abdomen, denies any weight gain. She will continue to monitor her weights and will voice concerns to MD if symptoms continue or progress.    Report her appetite has remained decreased, drinking Ensure for supplements.  She has nausea often, using multiple meds to decrease symptoms.  She has seen GI in the past, will contact them for follow up if needed.  Also report she has had a couple falls in the past couple months, using a cane for stability.  Has intermittent dizziness when looking down or when moving too fast.  Report she is scheduled for MRI but won't have it completed until after her radiation sessions. Until then, she is careful to limit her neck movement.  She is hoping to have some form of PT but MD prefer to wait until after MRI resulted to determine severity of injury.    Denies any urgent concerns, advised to contact this care manager with questions. Will follow up within the next month.  [facility] CM Care Plan Problem Two     Most Recent Value  Care Plan Problem Two  Knowledge deficit regarding new diagnosis of breast cancer  Role Documenting the Problem Two  Care Management Coordinator  Care Plan for Problem Two  Active  Interventions for Problem Two Long Term Goal   Reviewed plan of care for radiation treatments.  Advised to discuss symptoms with MD   [facility] Long Term Goal  Member  will report no complications of new cancer treatment plan over the next 45 days  [facility] Long Term Goal Start Date  12/15/19  [facility] CM Short Term Goal #1   Member will keepand attend appointments with oncologist and radiology oncologist within the next 2 weeks  [facility] CM Short Term Goal #1 Start Date  12/15/19  [facility] CM Short Term Goal #1 Met Date   01/19/20  [facility] CM Short Term Goal #2   Member will report decreased pain at the surgical site within the next 2 weeks  [facility] CM Short Term Goal #2 Start Date  12/15/19  [facility] CM Short Term Goal #2 Met Date  01/19/20  [facility] CM Short Term Goal #3   Member will report no falls within the next 4 weeks  [facility] CM Short Term Goal #3 Start Date  01/19/20  Interventions for Short Term Goal #3  Reviewed fall prevention with member, discussed interventions to decrease risk of falling (having someone present with transition, using cane/walker)     Valente David, RN, MSN [place] Manager [phone]

## 2020-01-20 ENCOUNTER — Other Ambulatory Visit: Payer: Self-pay

## 2020-01-20 ENCOUNTER — Ambulatory Visit
Admission: RE | Admit: 2020-01-20 | Discharge: 2020-01-20 | Disposition: A | Discharge status: Home / Self Care | Payer: Medicare Other | Source: Ambulatory Visit | Attending: Radiation Oncology | Admitting: Radiation Oncology

## 2020-01-20 DIAGNOSIS — Z171 Estrogen receptor negative status [ER-]: Secondary | ICD-10-CM | POA: Diagnosis not present

## 2020-01-20 DIAGNOSIS — Z510 Encounter for antineoplastic radiation therapy: Secondary | ICD-10-CM | POA: Diagnosis not present

## 2020-01-20 DIAGNOSIS — C50412 Malignant neoplasm of upper-outer quadrant of left female breast: Secondary | ICD-10-CM | POA: Diagnosis not present

## 2020-01-21 ENCOUNTER — Ambulatory Visit
Admission: RE | Admit: 2020-01-21 | Discharge: 2020-01-21 | Disposition: A | Discharge status: Home / Self Care | Payer: Medicare Other | Source: Ambulatory Visit | Attending: Radiation Oncology | Admitting: Radiation Oncology

## 2020-01-21 ENCOUNTER — Other Ambulatory Visit: Payer: Self-pay

## 2020-01-21 ENCOUNTER — Other Ambulatory Visit: Payer: Self-pay | Admitting: Adult Health

## 2020-01-21 DIAGNOSIS — Z510 Encounter for antineoplastic radiation therapy: Secondary | ICD-10-CM | POA: Diagnosis not present

## 2020-01-21 DIAGNOSIS — Z171 Estrogen receptor negative status [ER-]: Secondary | ICD-10-CM | POA: Diagnosis not present

## 2020-01-21 DIAGNOSIS — C50412 Malignant neoplasm of upper-outer quadrant of left female breast: Secondary | ICD-10-CM | POA: Diagnosis not present

## 2020-01-22 ENCOUNTER — Other Ambulatory Visit: Payer: Self-pay

## 2020-01-22 ENCOUNTER — Ambulatory Visit
Admission: RE | Admit: 2020-01-22 | Discharge: 2020-01-22 | Disposition: A | Discharge status: Home / Self Care | Payer: Medicare Other | Source: Ambulatory Visit | Attending: Radiation Oncology | Admitting: Radiation Oncology

## 2020-01-22 DIAGNOSIS — Z171 Estrogen receptor negative status [ER-]: Secondary | ICD-10-CM | POA: Diagnosis not present

## 2020-01-22 DIAGNOSIS — Z510 Encounter for antineoplastic radiation therapy: Secondary | ICD-10-CM | POA: Diagnosis not present

## 2020-01-22 DIAGNOSIS — C50412 Malignant neoplasm of upper-outer quadrant of left female breast: Secondary | ICD-10-CM | POA: Diagnosis not present

## 2020-01-24 DIAGNOSIS — R0902 Hypoxemia: Secondary | ICD-10-CM | POA: Diagnosis not present

## 2020-01-24 DIAGNOSIS — J189 Pneumonia, unspecified organism: Secondary | ICD-10-CM | POA: Diagnosis not present

## 2020-01-24 DIAGNOSIS — J4540 Moderate persistent asthma, uncomplicated: Secondary | ICD-10-CM | POA: Diagnosis not present

## 2020-01-24 DIAGNOSIS — J45909 Unspecified asthma, uncomplicated: Secondary | ICD-10-CM | POA: Diagnosis not present

## 2020-01-25 ENCOUNTER — Ambulatory Visit
Admission: RE | Admit: 2020-01-25 | Discharge: 2020-01-25 | Disposition: A | Discharge status: Home / Self Care | Payer: Medicare Other | Source: Ambulatory Visit | Attending: Radiation Oncology | Admitting: Radiation Oncology

## 2020-01-25 ENCOUNTER — Other Ambulatory Visit: Payer: Self-pay

## 2020-01-25 DIAGNOSIS — Z171 Estrogen receptor negative status [ER-]: Secondary | ICD-10-CM | POA: Diagnosis not present

## 2020-01-25 DIAGNOSIS — C50412 Malignant neoplasm of upper-outer quadrant of left female breast: Secondary | ICD-10-CM | POA: Diagnosis not present

## 2020-01-25 DIAGNOSIS — Z510 Encounter for antineoplastic radiation therapy: Secondary | ICD-10-CM | POA: Diagnosis not present

## 2020-01-26 ENCOUNTER — Ambulatory Visit
Admission: RE | Admit: 2020-01-26 | Discharge: 2020-01-26 | Disposition: A | Discharge status: Home / Self Care | Payer: Medicare Other | Source: Ambulatory Visit | Attending: Radiation Oncology | Admitting: Radiation Oncology

## 2020-01-26 DIAGNOSIS — Z510 Encounter for antineoplastic radiation therapy: Secondary | ICD-10-CM | POA: Diagnosis not present

## 2020-01-26 DIAGNOSIS — Z171 Estrogen receptor negative status [ER-]: Secondary | ICD-10-CM | POA: Diagnosis not present

## 2020-01-26 DIAGNOSIS — C50412 Malignant neoplasm of upper-outer quadrant of left female breast: Secondary | ICD-10-CM | POA: Diagnosis not present

## 2020-01-27 ENCOUNTER — Other Ambulatory Visit: Payer: Self-pay

## 2020-01-27 ENCOUNTER — Ambulatory Visit
Admission: RE | Admit: 2020-01-27 | Discharge: 2020-01-27 | Disposition: A | Discharge status: Home / Self Care | Payer: Medicare Other | Source: Ambulatory Visit | Attending: Radiation Oncology | Admitting: Radiation Oncology

## 2020-01-27 DIAGNOSIS — Z510 Encounter for antineoplastic radiation therapy: Secondary | ICD-10-CM | POA: Diagnosis not present

## 2020-01-27 DIAGNOSIS — C50412 Malignant neoplasm of upper-outer quadrant of left female breast: Secondary | ICD-10-CM | POA: Diagnosis not present

## 2020-01-27 DIAGNOSIS — Z171 Estrogen receptor negative status [ER-]: Secondary | ICD-10-CM | POA: Diagnosis not present

## 2020-01-28 ENCOUNTER — Other Ambulatory Visit: Payer: Self-pay

## 2020-01-28 ENCOUNTER — Ambulatory Visit
Admission: RE | Admit: 2020-01-28 | Discharge: 2020-01-28 | Disposition: A | Discharge status: Home / Self Care | Payer: Medicare Other | Source: Ambulatory Visit | Attending: Radiation Oncology | Admitting: Radiation Oncology

## 2020-01-28 DIAGNOSIS — Z171 Estrogen receptor negative status [ER-]: Secondary | ICD-10-CM | POA: Diagnosis not present

## 2020-01-28 DIAGNOSIS — Z510 Encounter for antineoplastic radiation therapy: Secondary | ICD-10-CM | POA: Diagnosis not present

## 2020-01-28 DIAGNOSIS — C50412 Malignant neoplasm of upper-outer quadrant of left female breast: Secondary | ICD-10-CM | POA: Diagnosis not present

## 2020-01-29 ENCOUNTER — Ambulatory Visit: Payer: Medicare Other | Admitting: Radiation Oncology

## 2020-01-29 ENCOUNTER — Telehealth: Payer: Self-pay

## 2020-01-29 ENCOUNTER — Other Ambulatory Visit: Payer: Self-pay | Admitting: Adult Health

## 2020-01-29 ENCOUNTER — Ambulatory Visit
Admission: RE | Admit: 2020-01-29 | Discharge: 2020-01-29 | Disposition: A | Discharge status: Home / Self Care | Payer: Medicare Other | Source: Ambulatory Visit | Attending: Radiation Oncology | Admitting: Radiation Oncology

## 2020-01-29 DIAGNOSIS — C50412 Malignant neoplasm of upper-outer quadrant of left female breast: Secondary | ICD-10-CM | POA: Diagnosis not present

## 2020-01-29 DIAGNOSIS — Z510 Encounter for antineoplastic radiation therapy: Secondary | ICD-10-CM | POA: Diagnosis not present

## 2020-01-29 DIAGNOSIS — Z171 Estrogen receptor negative status [ER-]: Secondary | ICD-10-CM | POA: Diagnosis not present

## 2020-01-29 NOTE — Telephone Encounter (Signed)
01/29/2020  Unable to leave message  voicemail does not pick up.  Will call again next week. Ambrose Mantle [phone]

## 2020-02-01 ENCOUNTER — Ambulatory Visit
Admission: RE | Admit: 2020-02-01 | Discharge: 2020-02-01 | Disposition: A | Discharge status: Home / Self Care | Payer: Medicare Other | Source: Ambulatory Visit | Attending: Radiation Oncology | Admitting: Radiation Oncology

## 2020-02-01 ENCOUNTER — Other Ambulatory Visit: Payer: Self-pay

## 2020-02-01 DIAGNOSIS — C50412 Malignant neoplasm of upper-outer quadrant of left female breast: Secondary | ICD-10-CM | POA: Insufficient documentation

## 2020-02-01 DIAGNOSIS — Z171 Estrogen receptor negative status [ER-]: Secondary | ICD-10-CM | POA: Insufficient documentation

## 2020-02-01 DIAGNOSIS — Z510 Encounter for antineoplastic radiation therapy: Secondary | ICD-10-CM | POA: Insufficient documentation

## 2020-02-02 ENCOUNTER — Encounter: Payer: Self-pay | Admitting: Radiation Oncology

## 2020-02-02 ENCOUNTER — Telehealth: Payer: Self-pay

## 2020-02-02 ENCOUNTER — Ambulatory Visit
Admission: RE | Admit: 2020-02-02 | Discharge: 2020-02-02 | Disposition: A | Discharge status: Home / Self Care | Payer: Medicare Other | Source: Ambulatory Visit | Attending: Radiation Oncology | Admitting: Radiation Oncology

## 2020-02-02 DIAGNOSIS — C50412 Malignant neoplasm of upper-outer quadrant of left female breast: Secondary | ICD-10-CM | POA: Diagnosis not present

## 2020-02-02 DIAGNOSIS — Z510 Encounter for antineoplastic radiation therapy: Secondary | ICD-10-CM | POA: Diagnosis not present

## 2020-02-02 DIAGNOSIS — Z171 Estrogen receptor negative status [ER-]: Secondary | ICD-10-CM | POA: Diagnosis not present

## 2020-02-02 NOTE — Progress Notes (Signed)
This is a follow up to document discussion with our nurse who let me know the patient has had 3 days over the last 5 of dizziness. She has bp medications on hold per last hospitalization and per PCP.  She is still taking lasix. She reports eating and drinking well in the last week. Her BP was has not gone over [id number] systolic per report. Today at our office, her  BP 132/96 sitting HR 87, 118/61 standing HR 94.   I recommended that the patient follow up with cardiology. She has an indwelling loop recorder, I'm not sure if that needs to be looked at since her recorder has a dead battery. She does not have any shortness of breath or chest pain. She was advised to contact cardiology for evaluation.

## 2020-02-02 NOTE — Telephone Encounter (Signed)
02/02/2020 Spoke with patient, she has contacted resources and has an appointment with [employer] next  week.  I emailed an application for the [employer], part of the form will need to be completed by the cancer center.   Shelby Anderson [phone]

## 2020-02-02 NOTE — Progress Notes (Signed)
Patient came around after her treatment with complaints of feeling dizzy.  Orthostatic BP taken 132/96, 87 sitting and 118/61 94 standing.  She states that her PCP has placed her BP medications on hold and she has recently been started on Lasix daily by her pulmonologist.  She was advised to monitor her BP and to restart her medications based on parameters set by her PCP.  She states the dizziness started last week, occurring a few days.  No other issues going on, no chest pain, or SOB.  After discussion with PA she was advised to continue to monitor her BP and to call her cardiologist office to see if she can get an appointment to be evaluated.  She verbalized understanding.  Will continue to follow as necessary.  Gloriajean Dell. Leonie Green, BSN

## 2020-02-03 ENCOUNTER — Other Ambulatory Visit: Payer: Self-pay

## 2020-02-03 ENCOUNTER — Ambulatory Visit
Admission: RE | Admit: 2020-02-03 | Discharge: 2020-02-03 | Disposition: A | Discharge status: Home / Self Care | Payer: Medicare Other | Source: Ambulatory Visit | Attending: Radiation Oncology | Admitting: Radiation Oncology

## 2020-02-03 DIAGNOSIS — Z510 Encounter for antineoplastic radiation therapy: Secondary | ICD-10-CM | POA: Diagnosis not present

## 2020-02-03 DIAGNOSIS — Z171 Estrogen receptor negative status [ER-]: Secondary | ICD-10-CM | POA: Diagnosis not present

## 2020-02-03 DIAGNOSIS — C50412 Malignant neoplasm of upper-outer quadrant of left female breast: Secondary | ICD-10-CM | POA: Diagnosis not present

## 2020-02-04 ENCOUNTER — Ambulatory Visit
Admission: RE | Admit: 2020-02-04 | Discharge: 2020-02-04 | Disposition: A | Discharge status: Home / Self Care | Payer: Medicare Other | Source: Ambulatory Visit | Attending: Radiation Oncology | Admitting: Radiation Oncology

## 2020-02-04 ENCOUNTER — Other Ambulatory Visit: Payer: Self-pay

## 2020-02-04 ENCOUNTER — Ambulatory Visit
Admission: RE | Admit: 2020-02-04 | Discharge: 2020-02-04 | Disposition: A | Discharge status: Home / Self Care | Payer: Medicare Other | Source: Ambulatory Visit | Attending: Neurology | Admitting: Neurology

## 2020-02-04 DIAGNOSIS — R519 Headache, unspecified: Secondary | ICD-10-CM

## 2020-02-04 DIAGNOSIS — M542 Cervicalgia: Secondary | ICD-10-CM

## 2020-02-04 DIAGNOSIS — R42 Dizziness and giddiness: Secondary | ICD-10-CM

## 2020-02-04 DIAGNOSIS — C50412 Malignant neoplasm of upper-outer quadrant of left female breast: Secondary | ICD-10-CM | POA: Diagnosis not present

## 2020-02-04 DIAGNOSIS — Z510 Encounter for antineoplastic radiation therapy: Secondary | ICD-10-CM | POA: Diagnosis not present

## 2020-02-04 DIAGNOSIS — I6782 Cerebral ischemia: Secondary | ICD-10-CM | POA: Diagnosis not present

## 2020-02-04 DIAGNOSIS — Z171 Estrogen receptor negative status [ER-]: Secondary | ICD-10-CM | POA: Diagnosis not present

## 2020-02-04 MED ORDER — GADOBENATE DIMEGLUMINE 529 MG/ML IV SOLN
20.0000 mL | Freq: Once | INTRAVENOUS | Status: AC | PRN
  Administered 2020-02-04: 18:00:00 20 mL via INTRAVENOUS

## 2020-02-05 ENCOUNTER — Other Ambulatory Visit: Payer: Self-pay

## 2020-02-05 ENCOUNTER — Ambulatory Visit
Admission: RE | Admit: 2020-02-05 | Discharge: 2020-02-05 | Disposition: A | Discharge status: Home / Self Care | Payer: Medicare Other | Source: Ambulatory Visit | Attending: Radiation Oncology | Admitting: Radiation Oncology

## 2020-02-05 DIAGNOSIS — Z171 Estrogen receptor negative status [ER-]: Secondary | ICD-10-CM | POA: Diagnosis not present

## 2020-02-05 DIAGNOSIS — Z510 Encounter for antineoplastic radiation therapy: Secondary | ICD-10-CM | POA: Diagnosis not present

## 2020-02-05 DIAGNOSIS — C50412 Malignant neoplasm of upper-outer quadrant of left female breast: Secondary | ICD-10-CM | POA: Diagnosis not present

## 2020-02-06 NOTE — Progress Notes (Signed)
  Radiation Oncology         [phone]) [phone] ________________________________  Name: Shelby Anderson MRN: [id number]  Date: 01/05/2020  DOB: 04-08-1956   DIAGNOSIS:     ICD-10-CM   1. Malignant neoplasm of upper-outer quadrant of left breast in female, estrogen receptor negative ([place])  C50.412    Z17.1     SIMULATION AND TREATMENT PLANNING NOTE  The patient presented for simulation prior to beginning her course of radiation treatment for her diagnosis of left-sided breast cancer. The patient was placed in a supine position on a breast board. A customized vac-lock bag was constructed and this complex treatment device will be used on a daily basis during her treatment. In this fashion, a CT scan was obtained through the chest area and an isocenter was placed near the chest wall within the breast.  The patient will be planned to receive a course of radiation initially to a dose of 42.56 Gy. This will consist of a whole breast radiotherapy technique. To accomplish this, 2 customized blocks have been designed which will correspond to medial and lateral whole breast tangent fields. This treatment will be accomplished at 2.66 Gy per fraction. A forward planning technique will also be evaluated to determine if this approach improves the plan. It is anticipated that the patient will then receive a 8 Gy boost to the seroma cavity which has been contoured. This will be accomplished at 2 Gy per fraction.   This initial treatment will consist of a 3-D conformal technique. The seroma has been contoured as the primary target structure. Additionally, dose volume histograms of both this target as well as the lungs and heart will also be evaluated. Such an approach is necessary to ensure that the target area is adequately covered while the nearby critical  normal structures are adequately spared.  Plan:  The final anticipated total dose therefore will correspond to 50.56 Gy.   Special treatment procedure was  performed today due to the extra time and effort required by myself to plan and prepare this patient for deep inspiration breath hold technique.  I have determined cardiac sparing to be of benefit to this patient to prevent long term cardiac damage due to radiation of the heart.  Bellows were placed on the patient's abdomen. To facilitate cardiac sparing, the patient was coached by the radiation therapists on breath hold techniques and breathing practice was performed. Practice waveforms were obtained. The patient was then scanned while maintaining breath hold in the treatment position.  This image was then transferred over to the imaging specialist. The imaging specialist then created a fusion of the free breathing and breath hold scans using the chest wall as the stable structure. I personally reviewed the fusion in axial, coronal and sagittal image planes.  Excellent cardiac sparing was obtained.  I felt the patient is an appropriate candidate for breath hold and the patient will be treated as such.  The image fusion was then reviewed with the patient to reinforce the necessity of reproducible breath hold.     _______________________________   Jodelle Gross, MD, PhD

## 2020-02-06 NOTE — Progress Notes (Signed)
  Radiation Oncology         [phone]) [phone] ________________________________  Name: TEREASA Anderson MRN: [id number]  Date: 01/05/2020  DOB: [birth year]  Optical Surface Tracking Plan:  Since intensity modulated radiotherapy (IMRT) and 3D conformal radiation treatment methods are predicated on accurate and precise positioning for treatment, intrafraction motion monitoring is medically necessary to ensure accurate and safe treatment delivery.  The ability to quantify intrafraction motion without excessive ionizing radiation dose can only be performed with optical surface tracking. Accordingly, surface imaging offers the opportunity to obtain 3D measurements of patient position throughout IMRT and 3D treatments without excessive radiation exposure.  I am ordering optical surface tracking for this patient's upcoming course of radiotherapy. ________________________________  Kyung Rudd, MD 02/06/2020 3:30 PM    Reference:   Ursula Alert, J, et al. Surface imaging-based analysis of intrafraction motion for breast radiotherapy patients.Journal of [place], n. 6, nov. 2014. ISSN [id number].   Available at: <http://www.jacmp.org/index.php/jacmp/article/view/4957>.

## 2020-02-07 NOTE — Progress Notes (Signed)
Patient Care Team: Caren Macadam, MD as PCP - General (Family Medicine) Rigoberto Noel, MD as Consulting Physician (Pulmonary Disease) Adrian Prows, MD as Consulting Physician (Cardiology) Lenon Oms, MD as Referring Physician (Obstetrics and Gynecology) Veneda Melter, MD as Referring Physician (Cardiology) [name], MD as Consulting Physician (Anesthesiology) Terrance Mass, MD (Inactive) as Consulting Physician (Gynecology) Rockwell Germany, RN as Oncology Nurse Navigator Mauro Kaufmann, RN as Oncology Nurse Navigator Rolm Bookbinder, MD as Consulting Physician (General Surgery) Nicholas Lose, MD as Consulting Physician (Hematology and Oncology) Kyung Rudd, MD as Consulting Physician (Radiation Oncology) Alda Berthold, DO as Consulting Physician (Neurology) Jarome Matin, MD as Consulting Physician (Dermatology) Valente David, RN as [place] Management  DIAGNOSIS:    ICD-10-CM   1. Malignant neoplasm of upper-outer quadrant of left breast in female, estrogen receptor negative ([place])  C50.412    Z17.1     SUMMARY OF ONCOLOGIC HISTORY: Oncology History  Malignant neoplasm of upper-outer quadrant of left breast in female, estrogen receptor negative ([place])  09/07/2019 Initial Diagnosis   Routine screening mammogram detected a 2.1cm mass in the left breast and no left axillary adenopathy. 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   09/24/2019 -  Chemotherapy   The patient had palonosetron (ALOXI) injection 0.25 mg, 0.25 mg, Intravenous,  Once, 2 of 6 cycles Administration: 0.25 mg (09/24/2019), 0.25 mg (10/15/2019) pegfilgrastim (NEULASTA ONPRO KIT) injection 6 mg, 6 mg, Subcutaneous, Once, 2 of 6 cycles Administration: 6 mg (09/24/2019), 6 mg (10/15/2019) cyclophosphamide (CYTOXAN) 1,100 mg in sodium chloride 0.9 % 250 mL chemo infusion, 500 mg/m2 = 1,100 mg (83.3 % of original dose 600 mg/m2),  Intravenous,  Once, 2 of 6 cycles Dose modification: 500 mg/m2 (original dose 600 mg/m2, Cycle 1, Reason: Provider Judgment) Administration: 1,100 mg (09/24/2019), 1,100 mg (10/15/2019) DOCEtaxel (TAXOTERE) 140 mg in sodium chloride 0.9 % 250 mL chemo infusion, 65 mg/m2 = 140 mg (86.7 % of original dose 75 mg/m2), Intravenous,  Once, 2 of 6 cycles Dose modification: 65 mg/m2 (original dose 75 mg/m2, Cycle 1, Reason: Provider Judgment) Administration: 140 mg (09/24/2019), 140 mg (10/15/2019) fosaprepitant (EMEND) 150 mg in sodium chloride 0.9 % 145 mL IVPB, , Intravenous,  Once, 2 of 6 cycles Administration:  (09/24/2019),  (10/15/2019)  for chemotherapy treatment.    12/09/2019 Surgery   Left lumpectomy Donne Hazel): microscopic focus of residual IDC, grade 3, with high grade DCIS, clear margins.    12/10/2019 Cancer Staging   Staging form: Breast, AJCC 8th Edition - Pathologic stage from 12/10/2019: No Stage Recommended (ypT1a, pN0, cM0) - Signed by Gardenia Phlegm, NP on 12/23/2019   01/13/2020 -  Radiation Therapy   Adjuvant radiation therapy     CHIEF COMPLIANT: Follow-up of triple negative left breast cancer  INTERVAL HISTORY: Shelby Anderson is a 64 y.o. with above-mentioned history of triple negative left breast cancer treated with neoadjuvant hemotherapy, lumpectomy, and who is currently on radiation. She presents to the clinic today to discuss further treatment.   ALLERGIES:  is allergic to lodine [etodolac]; oxycontin [oxycodone hcl]; penicillins; aspirin; darvocet [propoxyphene n-acetaminophen]; nitroglycerin; tramadol; ultram [tramadol hcl]; and valium.  MEDICATIONS:  Current Outpatient Medications  Medication Sig Dispense Refill  . albuterol (PROVENTIL) (2.5 MG/3ML) 0.083% nebulizer solution Take 3 mLs (2.5 mg total) by nebulization every 6 (six) hours as needed for wheezing or shortness of breath (J45.40). (Patient taking differently: Take 2.5 mg by  nebulization  every 6 (six) hours as needed for wheezing or shortness of breath. ) 75 mL 3  . albuterol (VENTOLIN HFA) 108 (90 Base) MCG/ACT inhaler Inhale 2 puffs into the lungs every 6 (six) hours as needed for wheezing or shortness of breath. 3 g 3  . aspirin EC 81 MG tablet Take 81 mg at bedtime by mouth.     Marland Kitchen atorvastatin (LIPITOR) 40 MG tablet Take 1 tablet (40 mg total) by mouth at bedtime. 90 tablet 1  . Blood Glucose Monitoring Suppl (ONE TOUCH ULTRA 2) w/Device KIT USE TO CHECK BLOOD SUGAR 2 TIMES A DAY 1 kit 0  . budesonide-formoterol (SYMBICORT) 80-4.5 MCG/ACT inhaler INHALE 2 PUFFS INTO THE LUNGS EVERY MORNING AND ANOTHER 2 PUFFS 12 HOURS LATER (Patient taking differently: Inhale 2 puffs into the lungs 2 (two) times daily. ) 30.6 Inhaler 1  . Calcium Carbonate-Vitamin D 600-200 MG-UNIT TABS Take 1 tablet by mouth daily.    Marland Kitchen CINNAMON PO Take 1,000 mg 2 (two) times daily by mouth.    . clobetasol cream (TEMOVATE) 0.05 % Apply topically 2 (two) times daily. (Patient taking differently: Apply topically daily as needed (rash). ) 30 g 0  . doxepin (SINEQUAN) 10 MG capsule Take 20 mg by mouth at bedtime.     Marland Kitchen esomeprazole (NEXIUM) 40 MG capsule Take 1 capsule (40 mg total) by mouth 2 (two) times daily. (Patient taking differently: Take 80 mg by mouth 2 (two) times daily. ) 180 capsule 3  . famotidine (PEPCID) 20 MG tablet Take 1 tablet (20 mg total) by mouth 2 (two) times daily as needed for heartburn or indigestion. 60 tablet 3  . fluticasone (FLONASE) 50 MCG/ACT nasal spray Place 2 sprays at bedtime as needed into both nostrils for allergies or rhinitis.     . furosemide (LASIX) 20 MG tablet TAKE 1-2 TABLETS BY MOUTH DAILY AS DIRECTED. 60 tablet 0  . glipiZIDE (GLUCOTROL XL) 5 MG 24 hr tablet TAKE 1 TABLET BY MOUTH EVERY DAY WITH BREAKFAST (Patient taking differently: Take 5 mg by mouth daily with breakfast. ) 90 tablet 3  . HYDROcodone-acetaminophen (NORCO) 10-325 MG tablet Take 1 tablet by mouth every  6 (six) hours as needed. (Patient not taking: Reported on 12/31/2019) 10 tablet 0  . L-Methylfolate (DEPLIN) 7.5 MG TABS Take 7.5 mg by mouth daily with breakfast.     . Lancets (ONETOUCH DELICA PLUS EXNTZG01V) MISC USE TO CHECK BLOOD SUGAR 2 TIMES A DAY 100 each 3  . LYRICA 150 MG capsule Take 150 mg by mouth 3 (three) times daily.   2  . metFORMIN (GLUCOPHAGE) 500 MG tablet TAKE 1 TABLET (500 MG TOTAL) BY MOUTH 2 (TWO) TIMES DAILY WITH A MEAL. 180 tablet 1  . Misc Natural Products (GLUCOS-CHONDROIT-MSM COMPLEX PO) Take 1 tablet 3 (three) times daily by mouth.     . montelukast (SINGULAIR) 10 MG tablet TAKE 1 TABLET BY MOUTH EVERYDAY AT BEDTIME (Patient taking differently: Take 10 mg by mouth at bedtime. ) 90 tablet 1  . Multiple Vitamin (MULITIVITAMIN WITH MINERALS) TABS Take 1 tablet by mouth daily.      . mupirocin cream (BACTROBAN) 2 % Apply once daily to lesions on feet as needed. 30 g 1  . nitroGLYCERIN (NITROSTAT) 0.4 MG SL tablet Place 0.4 mg under the tongue every 5 (five) minutes as needed for chest pain.     Marland Kitchen ondansetron (ZOFRAN) 4 MG tablet Take 4 mg by mouth every 8 (eight) hours  as needed for nausea or vomiting.    Glory Rosebush ULTRA test strip USE TO CHECK BLOOD SUGAR 2 TIMES A DAY 100 strip 1  . Oxycodone HCl 20 MG TABS Take 20 mg every 4 (four) hours as needed by mouth (pain).     . OXYGEN Inhale 2 L into the lungs at bedtime as needed (shortness of breath).     . potassium chloride (KLOR-CON) 10 MEQ tablet Take 2 tablets (20 mEq total) by mouth daily. 180 tablet 1  . promethazine (PHENERGAN) 25 MG tablet Take 1 tablet (25 mg total) by mouth every 6 (six) hours as needed for nausea or vomiting. (Patient taking differently: Take 25 mg by mouth at bedtime. Additional as needed) 90 tablet 3  . sucralfate (CARAFATE) 1 g tablet Take 1 tablet (1 g total) by mouth 4 (four) times daily -  with meals and at bedtime. Before taking slowly dissolve tablet in 1 Tablespoon of distilled water for  about 15 minutes to creat a slurry. (Patient taking differently: Take 1 g by mouth 4 (four) times daily. Before taking slowly dissolve tablet in 1 Tablespoon of distilled water for about 15 minutes to creat a slurry.) 360 tablet 3  . tiZANidine (ZANAFLEX) 4 MG tablet Take 4 mg by mouth every 8 (eight) hours as needed for muscle spasms.    . vitamin B-12 (CYANOCOBALAMIN) 500 MCG tablet Take 500 mcg by mouth daily.     . Vitamins/Minerals TABS Take by mouth.     No current facility-administered medications for this visit.    PHYSICAL EXAMINATION: ECOG PERFORMANCE STATUS: 1 - Symptomatic but completely ambulatory  Vitals:   02/08/20 1134  BP: 132/89  Pulse: 95  Resp: 17  Temp: 98 F (36.7 C)  SpO2: 94%   Filed Weights   02/08/20 1134  Weight: 225 lb 4.8 oz (102.2 kg)    LABORATORY DATA:  I have reviewed the data as listed CMP Latest Ref Rng & Units 12/08/2019 11/05/2019 11/03/2019  Glucose 70 - 99 mg/dL 150(H) 165(H) 126(H)  BUN 8 - 23 mg/dL 6(L) 19 24(H)  Creatinine 0.44 - 1.00 mg/dL 0.60 0.69 0.68  Sodium 135 - 145 mmol/L 143 145 144  Potassium 3.5 - 5.1 mmol/L 3.7 3.9 3.6  Chloride 98 - 111 mmol/L 102 106 101  CO2 22 - 32 mmol/L 27 31 33(H)  Calcium 8.9 - 10.3 mg/dL 9.2 9.3 9.1  Total Protein 6.5 - 8.1 g/dL 6.6 6.7 -  Total Bilirubin 0.3 - 1.2 mg/dL 0.3 <0.2(L) -  Alkaline Phos 38 - 126 U/L 63 77 -  AST 15 - 41 U/L 28 29 -  ALT 0 - 44 U/L 29 36 -    Lab Results  Component Value Date   WBC 5.7 12/08/2019   HGB 12.5 12/08/2019   HCT 40.9 12/08/2019   MCV 96.2 12/08/2019   PLT 265 12/08/2019   NEUTROABS 13.1 (H) 11/05/2019    ASSESSMENT & PLAN:  Malignant neoplasm of upper-outer quadrant of left breast in female, estrogen receptor negative ([place]) 09/07/2019:Routine screening mammogram detected a 2.1cm mass in the left breast and no left axillary adenopathy. 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  Recommendations: 1. Neoadjuvant  chemotherapy with Taxotere/Cytoxanx2starting 09/24/2019(discontinued because of respiratory failure after each chemotherapy) 2. Breast conserving surgery 12/09/2019: Microscopic foci of residual IDC grade 3, largest measured 2.5 mm foci of DCIS high-grade, resection margins negative, ER 0%, PR 0%, HER-2 negative, Ki-67 20%  on the final path, 0/1 lymph node negative 3. Adjuvant radiation therapy2/09/2020-02/08/2020 --------------------------------------------------------------------------------------------------------------------------------------------- 1.Hospitalization 68/61/6837-29/0/2111: Diastolic CHF and asthma exacerbation with hypoxia (prednisone taper and Lasix) 2.Hospitalization 10/30/2019-11/03/2019: Respiratory failure  Treatment plan: Surveillance Return to clinic in 3 months for survivorship care plan visit    No orders of the defined types were placed in this encounter.  The patient has a good understanding of the overall plan. she agrees with it. she will call with any problems that may develop before the next visit here.  Total time spent: 30 mins including face to face time and time spent for planning, charting and coordination of care  Nicholas Lose, MD 02/08/2020  I, Cloyde Reams Dorshimer, am acting as scribe for Dr. Nicholas Lose.  I have reviewed the above documentation for accuracy and completeness, and I agree with the above.

## 2020-02-08 ENCOUNTER — Telehealth: Payer: Self-pay | Admitting: Hematology and Oncology

## 2020-02-08 ENCOUNTER — Ambulatory Visit: Payer: Medicare Other | Admitting: Hematology and Oncology

## 2020-02-08 ENCOUNTER — Encounter: Payer: Self-pay | Admitting: Radiation Oncology

## 2020-02-08 ENCOUNTER — Other Ambulatory Visit: Payer: Self-pay

## 2020-02-08 ENCOUNTER — Inpatient Hospital Stay: Payer: Medicare Other | Attending: Oncology | Admitting: Hematology and Oncology

## 2020-02-08 ENCOUNTER — Encounter: Payer: Self-pay | Admitting: *Deleted

## 2020-02-08 ENCOUNTER — Ambulatory Visit
Admission: RE | Admit: 2020-02-08 | Discharge: 2020-02-08 | Disposition: A | Discharge status: Home / Self Care | Payer: Medicare Other | Source: Ambulatory Visit | Attending: Radiation Oncology | Admitting: Radiation Oncology

## 2020-02-08 DIAGNOSIS — Z923 Personal history of irradiation: Secondary | ICD-10-CM | POA: Diagnosis not present

## 2020-02-08 DIAGNOSIS — Z171 Estrogen receptor negative status [ER-]: Secondary | ICD-10-CM | POA: Diagnosis not present

## 2020-02-08 DIAGNOSIS — Z79899 Other long term (current) drug therapy: Secondary | ICD-10-CM | POA: Diagnosis not present

## 2020-02-08 DIAGNOSIS — Z9221 Personal history of antineoplastic chemotherapy: Secondary | ICD-10-CM | POA: Insufficient documentation

## 2020-02-08 DIAGNOSIS — C50412 Malignant neoplasm of upper-outer quadrant of left female breast: Secondary | ICD-10-CM | POA: Diagnosis not present

## 2020-02-08 DIAGNOSIS — Z510 Encounter for antineoplastic radiation therapy: Secondary | ICD-10-CM | POA: Diagnosis not present

## 2020-02-08 NOTE — Assessment & Plan Note (Signed)
09/07/2019:Routine screening mammogram detected a 2.1cm mass in the left breast and no left axillary adenopathy. 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  Recommendations: 1. Neoadjuvant chemotherapy with Taxotere/Cytoxanx2starting 09/24/2019(discontinued because of respiratory failure after each chemotherapy) 2. Breast conserving surgery 12/09/2019: Microscopic foci of residual IDC grade 3, largest measured 2.5 mm foci of DCIS high-grade, resection margins negative, ER 0%, PR 0%, HER-2 negative, Ki-67 20% on the final path, 0/1 lymph node negative 3. Adjuvant radiation therapy2/09/2020-02/08/2020 --------------------------------------------------------------------------------------------------------------------------------------------- 1.Hospitalization 27/00/4849-86/04/1685: Diastolic CHF and asthma exacerbation with hypoxia (prednisone taper and Lasix) 2.Hospitalization 10/30/2019-11/03/2019: Respiratory failure  Treatment plan: Surveillance Return to clinic in 3 months for survivorship care plan visit

## 2020-02-08 NOTE — Telephone Encounter (Signed)
I left a message regarding schedule  

## 2020-02-09 ENCOUNTER — Other Ambulatory Visit: Payer: Self-pay | Admitting: Adult Health

## 2020-02-09 ENCOUNTER — Telehealth: Payer: Self-pay

## 2020-02-09 NOTE — Telephone Encounter (Signed)
-----   Message from Alda Berthold, DO sent at 02/09/2020 10:05 AM EST ----- Can you also call patient with these results?  It does not appear that she is active on MyChart.  Thanks

## 2020-02-09 NOTE — Telephone Encounter (Signed)
Left message for patient to call office to discuss imaging results.

## 2020-02-10 ENCOUNTER — Telehealth: Payer: Self-pay

## 2020-02-10 NOTE — Telephone Encounter (Signed)
-----   Message from Shelby Anderson, [place] sent at 02/09/2020 10:25 AM EST ----- Left message for patient to call office to discuss imaging results.

## 2020-02-10 NOTE — Telephone Encounter (Signed)
Left message for patient to contact the office regarding results and recommendations. 

## 2020-02-11 ENCOUNTER — Other Ambulatory Visit: Payer: Self-pay | Admitting: Family Medicine

## 2020-02-15 ENCOUNTER — Ambulatory Visit: Payer: Medicare Other | Admitting: Adult Health

## 2020-02-16 ENCOUNTER — Telehealth: Payer: Self-pay | Admitting: Neurology

## 2020-02-16 NOTE — Telephone Encounter (Signed)
See prior telephone/result note.  Please inform patient that her MRI brain shows age-related changes, no evidence of tumor or anything worrisome. Specifically, nothing which would explain her vertigo. Thanks.

## 2020-02-16 NOTE — Telephone Encounter (Signed)
Patient called and lmom returning a call to the office for her MRI results. Please Call. Thank you

## 2020-02-17 NOTE — Telephone Encounter (Signed)
Patient informed Per Dr Posey Pronto that her MRI brain shows age-related changes, no evidence of tumor or anything worrisome. Specifically, nothing which would explain her vertigo. Pt verbalized understanding

## 2020-02-18 ENCOUNTER — Other Ambulatory Visit: Payer: Self-pay | Admitting: Internal Medicine

## 2020-02-18 ENCOUNTER — Other Ambulatory Visit: Payer: Self-pay | Admitting: *Deleted

## 2020-02-18 NOTE — Patient Outreach (Signed)
[facility]) Care Management  02/18/2020  TANAY SKAJA 15-Aug-1956 [id number]   Call placed to member to follow up on treatment of breast cancer, no answer.  Unable to leave voice message as phone only rings.  Will follow up within the next 3-4 business days.  Valente David, [place], MSN [place] [phone]

## 2020-02-19 ENCOUNTER — Other Ambulatory Visit: Payer: Self-pay

## 2020-02-19 ENCOUNTER — Encounter: Payer: Self-pay | Admitting: Adult Health

## 2020-02-19 ENCOUNTER — Ambulatory Visit: Payer: Medicare Other | Admitting: Adult Health

## 2020-02-19 DIAGNOSIS — J4530 Mild persistent asthma, uncomplicated: Secondary | ICD-10-CM

## 2020-02-19 DIAGNOSIS — J9611 Chronic respiratory failure with hypoxia: Secondary | ICD-10-CM

## 2020-02-19 DIAGNOSIS — J45991 Cough variant asthma: Secondary | ICD-10-CM

## 2020-02-19 DIAGNOSIS — G4733 Obstructive sleep apnea (adult) (pediatric): Secondary | ICD-10-CM | POA: Diagnosis not present

## 2020-02-19 MED ORDER — MONTELUKAST SODIUM 10 MG PO TABS: ORAL_TABLET | ORAL | 1 refills | Status: DC

## 2020-02-19 MED ORDER — BUDESONIDE-FORMOTEROL FUMARATE 80-4.5 MCG/ACT IN AERO: INHALATION_SPRAY | RESPIRATORY_TRACT | 1 refills | Status: DC

## 2020-02-19 MED ORDER — ALBUTEROL SULFATE HFA 108 (90 BASE) MCG/ACT IN AERS: 2.0000 | INHALATION_SPRAY | Freq: Four times a day (QID) | RESPIRATORY_TRACT | 3 refills | Status: DC | PRN

## 2020-02-19 NOTE — Progress Notes (Signed)
'@Patient'  ID: [name], female    DOB: 07-Mar-1956, 64 y.o.   MRN: [id number]  Chief Complaint  Patient presents with  . Follow-up    asthma osa    Referring provider: Caren Macadam, MD  HPI: 64 year old female followed for asthma and obstructive sleep apnea Hospitalization November 2020 with suspected drug-induced pneumonitis (from chemo) Medical history significant for triple negative breast cancer diagnosed in 2020 treated with chemo-Cytoxan and docetaxel.  Treatment stopped December 2020.  Status post left breast lumpectomy January 2021.  Status post XRT - Medical history significant for diabetes, chronic back pain, A. Fib, previous positive PPD Disabled RN  TEST/EVENTS :  Spirometry 12/2011 - no airway obstruction -No reversibility with bronchodilator , mod restriction  Spirometry8/2019FVC 1.64 (46%), FEV1 1.34 (49%), ratio 82 (104%)  07/2018 FENO 11  Home sleep test 07/2012 - showed desatn,no OSA  11/2015 ONO positive , Started on oxygen 2l/m .  10/2016 CT angio neg PE  CT sinus 01/2017 >> clear  12/2013 -admitted asthma exacerbation,CAP, RSV bronchitis.  EGD 03/2011 ([facility]) - peptic ulcer disease.  12/2013 barium esophagram - distal esophageal stricture,stricture dilated again 2017,2018([facility]) 01/2017 Swallow >>Persistent/recurrent stricture at the GE junction  HST 08/2018 AHI 9/h 09/2018 >> CPAP 10 cm, med FF mask    02/19/2020 Follow up ; Asthma, OSA  Patient returns for a 78-monthfollow-up.  She has underlying  asthma.  She says overall breathing has been doing well.  She is had no flare of her cough or wheezing.  She remains on Symbicort and Singulair.  She has had no increased albuterol use. Patient says she is starting to feel better she is had a rough time after being diagnosed with breast cancer.  She was started on chemotherapy however only received 2 treatments and was hospitalized after both treatments.  Consider to have drug  induced pneumonitis.  On follow-up visit in December 2020 was starting to improve and was weaned off of oxygen.  She says her oxygen levels have been good at home.  O2 sats run in mid 90s on room air.  Wants an order sent to the DME company to pick up her oxygen as she has no longer using it.  O2 saturations 95% on room air today.  Patient underwent a lumpectomy in January 2021.  And radiation therapy. She says she did find a new lump on the left breast and has seen her sPsychologist, sport and exercise  And has a planned ultrasound and mammogram planned in 2 weeks.  Patient has underlying obstructive sleep apnea.  Says she is doing well on CPAP.  She never misses a night.  Says she feels rested with no significant daytime sleepiness.  CPAP download shows excellent compliance with 100% usage.  Daily average usage at 5.5 hours.  Patient is on CPAP 10 cm H2O.  AHI 1.0.  Positive leaks.  Patient support system is her husband at home.  Is raising her 2 grandchildren that are school age.  Allergies  Allergen Reactions  . Lodine [Etodolac] Anaphylaxis, Hives and Swelling  . Oxycontin [Oxycodone Hcl] Anaphylaxis    hives, trouble breathing, tongue swelling (Only Oxycontin) Tolerates plain oxycodone.  .Marland KitchenPenicillins Anaphylaxis    Told by a surgeon never to take it again. Has patient had a PCN reaction causing immediate rash, facial/tongue/throat swelling, SOB or lightheadedness with hypotension: Yes Has patient had a PCN reaction causing severe rash involving mucus membranes or skin necrosis: Unknown Has patient had a PCN reaction that required hospitalization:  No Has patient had a PCN reaction occurring within the last 10 years: No If all of the above answers are "NO", then may proceed with Cephalosporin use.  . Aspirin Other (See Comments)    High-dose caused GI Bleeds  . Darvocet [Propoxyphene N-Acetaminophen] Hives  . Nitroglycerin Other (See Comments)    IV-BP drops dramatically Can take po  . Tramadol Hives and  Itching  . Ultram [Tramadol Hcl] Hives  . Valium Other (See Comments)    Circulation problems. "Legs turned black".    Immunization History  Administered Date(s) Administered  . Influenza Split 08/19/2011  . Influenza Whole 01/03/2013  . Influenza,inj,Quad PF,6+ Mos 08/20/2013, 09/15/2014, 08/26/2015, 08/01/2016, 08/22/2017, 10/15/2018, 09/24/2019  . Influenza-Unspecified 08/03/2016  . Pneumococcal Polysaccharide-23 08/19/2011, 12/12/2017  . Tdap 06/28/2014    Past Medical History:  Diagnosis Date  . Allergy   . Anemia    "chronic"  . Angina    Prinzmetal angina  . Anxiety and depression   . Arthritis   . Asthma   . Atrial fibrillation ([place])    h/o "AF w/frequent PVCs"  . Breast cancer ([place])    Left  . Cancer (HCC)    hx of skin cancer   . CHF (congestive heart failure) ([place])   . Chronic back pain greater than 3 months duration    on chronic narcotics, treated at pain clinic  . Colon polyps    hyperplastic  . Coronary artery disease    Arrythmia, orthostatic hypotension, HLD, HTN; sees Dr. Einar Gip  . Difficult intubation    "TMJ & woke up when they were still cutting on me"  . Dysrhythmia    sees Dr. Einar Gip and a cardiologist at [facility]  . Esophageal stricture   . Family history of melanoma ]  . Family history of pancreatic cancer   . Fatty liver   . Fatty liver   . Fibroids   . Fibromyalgia    "in my legs"  . GERD (gastroesophageal reflux disease)    hx hiatal hernia, stricture and gastric ulcer  . Headache(784.0)    migraines  . Heart murmur   . Hiatal hernia   . History of loop recorder   . History of migraines    "dx'd when I was in my teens"  . Hyperlipemia   . Hypertension   . Mental disorder   . Mild episode of recurrent major depressive disorder ([place]) 12/06/2015  . Myocardial infarction [facility]) 1980's & 1990;   sees Dr. Einar Gip  . OSA (obstructive sleep apnea)   . Pneumonia    multiple times  . PONV (postoperative nausea and vomiting)   .  Recurrent upper respiratory infection (URI)   . Shortness of breath 11/20/11   "all the time", sees pulmonlogy, ? asthma  . Stenotic cervical os   . Stomach ulcer    "3 small; found in 05/2011"  . TMJ (dislocation of temporomandibular joint)   . Tuberculosis    + TB SKIN TEST  . Type 2 diabetes mellitus without complication, without long-term current use of insulin ([place]) 12/06/2015   not on meds     Tobacco History: Social History   Tobacco Use  Smoking Status Never Smoker  Smokeless Tobacco Never Used   Counseling given: Not Answered   Outpatient Medications Prior to Visit  Medication Sig Dispense Refill  . albuterol (PROVENTIL) (2.5 MG/3ML) 0.083% nebulizer solution Take 3 mLs (2.5 mg total) by nebulization every 6 (six) hours as needed for wheezing or shortness  of breath (J45.40). (Patient taking differently: Take 2.5 mg by nebulization every 6 (six) hours as needed for wheezing or shortness of breath. ) 75 mL 3  . aspirin EC 81 MG tablet Take 81 mg at bedtime by mouth.     Marland Kitchen atorvastatin (LIPITOR) 40 MG tablet Take 1 tablet (40 mg total) by mouth at bedtime. 90 tablet 1  . Blood Glucose Monitoring Suppl (ONE TOUCH ULTRA 2) w/Device KIT USE TO CHECK BLOOD SUGAR 2 TIMES A DAY 1 kit 0  . Calcium Carbonate-Vitamin D 600-200 MG-UNIT TABS Take 1 tablet by mouth daily.    Marland Kitchen CINNAMON PO Take 1,000 mg 2 (two) times daily by mouth.    . clobetasol cream (TEMOVATE) 0.05 % Apply topically 2 (two) times daily. (Patient taking differently: Apply topically daily as needed (rash). ) 30 g 0  . doxepin (SINEQUAN) 10 MG capsule Take 20 mg by mouth at bedtime.     Marland Kitchen esomeprazole (NEXIUM) 40 MG capsule Take 1 capsule (40 mg total) by mouth 2 (two) times daily. (Patient taking differently: Take 80 mg by mouth 2 (two) times daily. ) 180 capsule 3  . famotidine (PEPCID) 20 MG tablet Take 1 tablet (20 mg total) by mouth 2 (two) times daily as needed for heartburn or indigestion. 60 tablet 3  . fluticasone  (FLONASE) 50 MCG/ACT nasal spray Place 2 sprays at bedtime as needed into both nostrils for allergies or rhinitis.     . furosemide (LASIX) 20 MG tablet TAKE 1-2 TABLETS BY MOUTH DAILY AS DIRECTED. 60 tablet 2  . glipiZIDE (GLUCOTROL XL) 5 MG 24 hr tablet TAKE 1 TABLET BY MOUTH EVERY DAY WITH BREAKFAST (Patient taking differently: Take 5 mg by mouth daily with breakfast. ) 90 tablet 3  . L-Methylfolate (DEPLIN) 7.5 MG TABS Take 7.5 mg by mouth daily with breakfast.     . Lancets (ONETOUCH DELICA PLUS YBOFBP10C) MISC USE TO CHECK BLOOD SUGAR 2 TIMES A DAY 100 each 3  . LYRICA 150 MG capsule Take 150 mg by mouth 3 (three) times daily.   2  . metFORMIN (GLUCOPHAGE) 500 MG tablet TAKE 1 TABLET (500 MG TOTAL) BY MOUTH 2 (TWO) TIMES DAILY WITH A MEAL. 180 tablet 1  . Misc Natural Products (GLUCOS-CHONDROIT-MSM COMPLEX PO) Take 1 tablet 3 (three) times daily by mouth.     . Multiple Vitamin (MULITIVITAMIN WITH MINERALS) TABS Take 1 tablet by mouth daily.      . mupirocin cream (BACTROBAN) 2 % Apply once daily to lesions on feet as needed. 30 g 1  . nitroGLYCERIN (NITROSTAT) 0.4 MG SL tablet Place 0.4 mg under the tongue every 5 (five) minutes as needed for chest pain.     Marland Kitchen ondansetron (ZOFRAN) 4 MG tablet Take 4 mg by mouth every 8 (eight) hours as needed for nausea or vomiting.    Glory Rosebush ULTRA test strip USE TO CHECK BLOOD SUGAR 2 TIMES A DAY 100 strip 1  . Oxycodone HCl 20 MG TABS Take 20 mg every 4 (four) hours as needed by mouth (pain).     . OXYGEN Inhale 2 L into the lungs at bedtime as needed (shortness of breath).     . potassium chloride (KLOR-CON) 10 MEQ tablet Take 2 tablets (20 mEq total) by mouth daily. 180 tablet 1  . promethazine (PHENERGAN) 25 MG tablet TAKE 1 TABLET (25 MG TOTAL) BY MOUTH EVERY 6 (SIX) HOURS AS NEEDED FOR NAUSEA OR VOMITING. 90 tablet 3  .  sucralfate (CARAFATE) 1 g tablet Take 1 tablet (1 g total) by mouth 4 (four) times daily -  with meals and at bedtime. Before  taking slowly dissolve tablet in 1 Tablespoon of distilled water for about 15 minutes to creat a slurry. (Patient taking differently: Take 1 g by mouth 4 (four) times daily. Before taking slowly dissolve tablet in 1 Tablespoon of distilled water for about 15 minutes to creat a slurry.) 360 tablet 3  . tiZANidine (ZANAFLEX) 4 MG tablet Take 4 mg by mouth every 8 (eight) hours as needed for muscle spasms.    . vitamin B-12 (CYANOCOBALAMIN) 500 MCG tablet Take 500 mcg by mouth daily.     Marland Kitchen albuterol (VENTOLIN HFA) 108 (90 Base) MCG/ACT inhaler Inhale 2 puffs into the lungs every 6 (six) hours as needed for wheezing or shortness of breath. 3 g 3  . budesonide-formoterol (SYMBICORT) 80-4.5 MCG/ACT inhaler INHALE 2 PUFFS INTO THE LUNGS EVERY MORNING AND ANOTHER 2 PUFFS 12 HOURS LATER (Patient taking differently: Inhale 2 puffs into the lungs 2 (two) times daily. ) 30.6 Inhaler 1  . montelukast (SINGULAIR) 10 MG tablet TAKE 1 TABLET BY MOUTH EVERYDAY AT BEDTIME (Patient taking differently: Take 10 mg by mouth at bedtime. ) 90 tablet 1  . Vitamins/Minerals TABS Take by mouth.     No facility-administered medications prior to visit.     Review of Systems:   Constitutional:   No  weight loss, night sweats,  Fevers, chills,  +fatigue, or  lassitude.  HEENT:   No headaches,  Difficulty swallowing,  Tooth/dental problems, or  Sore throat,                No sneezing, itching, ear ache, nasal congestion, post nasal drip,   CV:  No chest pain,  Orthopnea, PND, swelling in lower extremities, anasarca, dizziness, palpitations, syncope.   GI  No heartburn, indigestion, abdominal pain, nausea, vomiting, diarrhea, change in bowel habits, loss of appetite, bloody stools.   Resp:    No chest wall deformity  Skin: no rash or lesions.  GU: no dysuria, change in color of urine, no urgency or frequency.  No flank pain, no hematuria   MS:  No joint pain or swelling.  No decreased range of motion.  No back  pain.    Physical Exam  BP 106/66 (BP Location: Left Arm, Cuff Size: Large)   Pulse 75   Temp (!) 97.1 F (36.2 C) (Temporal)   Ht '5\' 6"'  (1.676 m)   Wt 227 lb 6.4 oz (103.1 kg)   SpO2 95% Comment: RA  BMI 36.70 kg/m   GEN: A/Ox3; pleasant , NAD, BMI 36   HEENT:  [place]/AT,  TMs-wnl, NOSE-clear, THROAT-clear, no lesions, no postnasal drip or exudate noted.   NECK:  Supple w/ fair ROM; no JVD; normal carotid impulses w/o bruits; no thyromegaly or nodules palpated; no lymphadenopathy.    RESP  Clear  P & A; w/o, wheezes/ rales/ or rhonchi. no accessory muscle use, no dullness to percussion  CARD:  RRR, no m/r/g, no peripheral edema, pulses intact, no cyanosis or clubbing.  GI:   Soft & nt; nml bowel sounds; no organomegaly or masses detected.   Musco: Warm bil, no deformities or joint swelling noted.   Neuro: alert, no focal deficits noted.    Skin: Warm, no lesions or rashes    Lab Results:  CBC    Component Value Date/Time   WBC 5.7 12/08/2019 1025   RBC  4.25 12/08/2019 1025   HGB 12.5 12/08/2019 1025   HGB 11.3 (L) 11/05/2019 1100   HCT 40.9 12/08/2019 1025   PLT 265 12/08/2019 1025   PLT 407 (H) 11/05/2019 1100   MCV 96.2 12/08/2019 1025   MCH 29.4 12/08/2019 1025   MCHC 30.6 12/08/2019 1025   RDW 14.4 12/08/2019 1025   LYMPHSABS 1.4 11/05/2019 1100   MONOABS 1.0 11/05/2019 1100   EOSABS 0.0 11/05/2019 1100   BASOSABS 0.1 11/05/2019 1100    BMET    Component Value Date/Time   NA 143 12/08/2019 1025   K 3.7 12/08/2019 1025   CL 102 12/08/2019 1025   CO2 27 12/08/2019 1025   GLUCOSE 150 (H) 12/08/2019 1025   BUN 6 (L) 12/08/2019 1025   CREATININE 0.60 12/08/2019 1025   CREATININE 0.69 11/05/2019 1100   CALCIUM 9.2 12/08/2019 1025   GFRNONAA >60 12/08/2019 1025   GFRNONAA >60 11/05/2019 1100   GFRAA >60 12/08/2019 1025   GFRAA >60 11/05/2019 1100    BNP    Component Value Date/Time   BNP 54.0 10/30/2019 1635    ProBNP    Component Value  Date/Time   PROBNP 40.0 07/15/2018 1455    Imaging:     No flowsheet data found.  Lab Results  Component Value Date   NITRICOXIDE 8 11/13/2016        Assessment & Plan:   OSA (obstructive sleep apnea) Excellent control and compliance on nocturnal CPAP  Plan  Patient Instructions  Continue on Symbicort 2 puffs Twice daily  , rinse after use.  Continue on Singulair daily  Albuterol As needed   Low salt diet  Covid vaccine when available.  Continue on CPAP At bedtime   Work on healthy diet and weight loss.  Follow up with Dr. Elsworth Soho  In 3 months and As needed   Please contact office for sooner follow up if symptoms do not improve or worsen or seek emergency care         Asthma Currently under good control.  Continue on present regimen.  Trigger prevention  Plan  Patient Instructions  Continue on Symbicort 2 puffs Twice daily  , rinse after use.  Continue on Singulair daily  Albuterol As needed   Low salt diet  Covid vaccine when available.  Continue on CPAP At bedtime   Work on healthy diet and weight loss.  Follow up with Dr. Elsworth Soho  In 3 months and As needed   Please contact office for sooner follow up if symptoms do not improve or worsen or seek emergency care         Chronic respiratory failure [facility]) Was recently on oxygen after hospitalization in November 2020.  This has resolved.  May discontinue oxygen from home we will send order to DME.     Rexene Edison, NP 02/19/2020

## 2020-02-19 NOTE — Patient Instructions (Signed)
Continue on Symbicort 2 puffs Twice daily  , rinse after use.  Continue on Singulair daily  Albuterol As needed   Low salt diet  Covid vaccine when available.  Continue on CPAP At bedtime   Work on healthy diet and weight loss.  Follow up with Dr. Elsworth Soho  In 3 months and As needed   Please contact office for sooner follow up if symptoms do not improve or worsen or seek emergency care

## 2020-02-19 NOTE — Assessment & Plan Note (Signed)
Was recently on oxygen after hospitalization in November 2020.  This has resolved.  May discontinue oxygen from home we will send order to DME.

## 2020-02-19 NOTE — Addendum Note (Signed)
Addended by: Parke Poisson E on: 02/19/2020 12:03 PM   Modules accepted: Orders

## 2020-02-19 NOTE — Assessment & Plan Note (Signed)
Excellent control and compliance on nocturnal CPAP  Plan  Patient Instructions  Continue on Symbicort 2 puffs Twice daily  , rinse after use.  Continue on Singulair daily  Albuterol As needed   Low salt diet  Covid vaccine when available.  Continue on CPAP At bedtime   Work on healthy diet and weight loss.  Follow up with Dr. Elsworth Soho  In 3 months and As needed   Please contact office for sooner follow up if symptoms do not improve or worsen or seek emergency care

## 2020-02-19 NOTE — Assessment & Plan Note (Signed)
Currently under good control.  Continue on present regimen.  Trigger prevention  Plan  Patient Instructions  Continue on Symbicort 2 puffs Twice daily  , rinse after use.  Continue on Singulair daily  Albuterol As needed   Low salt diet  Covid vaccine when available.  Continue on CPAP At bedtime   Work on healthy diet and weight loss.  Follow up with Dr. Elsworth Soho  In 3 months and As needed   Please contact office for sooner follow up if symptoms do not improve or worsen or seek emergency care

## 2020-02-23 ENCOUNTER — Other Ambulatory Visit: Payer: Self-pay | Admitting: *Deleted

## 2020-02-23 NOTE — Patient Outreach (Signed)
[place] [facility]) Care Management  02/23/2020  Shelby Anderson February 18, 1956 [id number]   Outreach attempt #2, unsuccessful.  Call placed to member to follow up on treatment for breast cancer.  Female answering phone state member is out of town.  Will send unsuccessful outreach letter and follow up within the next week as notified that member wouldn't be back in town until late this week or the weekend.  Valente David, [place], MSN [place] [phone]

## 2020-03-02 ENCOUNTER — Other Ambulatory Visit: Payer: Self-pay | Admitting: *Deleted

## 2020-03-02 ENCOUNTER — Telehealth: Payer: Self-pay | Admitting: Radiation Oncology

## 2020-03-02 NOTE — Telephone Encounter (Signed)
  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  Interval Since Last Radiation:  3 weeks   01/12/20-02/08/20: The left breast was treated to 42.56 Gy in 16 fractions followed by an 8 Gy boost in 4 fractions.  Narrative:  The patient was contacted today for routine follow-up. During treatment she did very well with radiotherapy and did not have significant desquamation. She reports she noticed a new area in the left breast in the past few weeks. She saw Dr. Donne Hazel and he recommended repeat ultrasound which is scheduled for 03/10/20.  Impression/Plan: 1. 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 The patient has been doing well since completion of radiotherapy. We discussed that we would be happy to continue to follow her as needed, but she will also continue to follow up with Dr. Lindi Adie in medical oncology. She was counseled on skin care as well as measures to avoid sun exposure to this area.  2. Survivorship. We discussed the importance of survivorship evaluation and encouraged her to attend her upcoming visit with that clinic. 3. New palpable mass in the left breast. She will follow up with her ultrasound and subsequently with Dr. Donne Hazel. We will follow up expectantly.    Carola Rhine, PAC

## 2020-03-02 NOTE — Patient Outreach (Signed)
[place] [facility]) Care Management  03/02/2020  MINYON TOW 1956-10-17 [id number]   Outreach attempt #3, unsuccessful.    Call placed to member to follow up on treatment for breast cancer.  Female answering phone state member is asleep, request made for member to call this care manager back.  Will await call back, if no call back will follow up with 4th and final outreach within the next week.  Valente David, [place], MSN [place] [phone]

## 2020-03-09 ENCOUNTER — Other Ambulatory Visit: Payer: Self-pay | Admitting: *Deleted

## 2020-03-09 NOTE — Patient Outreach (Signed)
[place] [facility]) Care Management  03/09/2020  Shelby Anderson 27-Nov-1956 [id number]   Outreach attempt #4, successful, identity verified.  She report she is doing well, completed her radiation sessions but has now found new lumps.  She will have an ultrasound on the new areas and follow up with oncologist pending results.  She is unsure about what the plan will be for treatment until after these appointments.  Report being nervous about what her next steps will be but denies any other ongoing concerns.  Other chronic medical conditions (CHF, HTN, DM) have all been well managed during this time of her cancer treatments.  Agrees to ongoing follow up calls,will follow up within the next month.  THN CM Care Plan Problem Two     Most Recent Value  Care Plan Problem Two  Knowledge deficit regarding new diagnosis of breast cancer  Role Documenting the Problem Two  Care Management Coordinator  Care Plan for Problem Two  Active  THN Long Term Goal  Member will report no complications of new cancer treatment plan over the next 45 days  THN Long Term Goal Start Date  12/15/19  [facility] Long Term Goal Met Date  03/09/20  [facility] CM Short Term Goal #3   Member will report no falls within the next 4 weeks  THN CM Short Term Goal #3 Start Date  01/19/20  [facility] CM Short Term Goal #3 Met Date  03/09/20     Valente David, RN, MSN [place] [phone]

## 2020-03-10 ENCOUNTER — Ambulatory Visit: Payer: Self-pay | Admitting: *Deleted

## 2020-03-10 DIAGNOSIS — M47816 Spondylosis without myelopathy or radiculopathy, lumbar region: Secondary | ICD-10-CM | POA: Diagnosis not present

## 2020-03-10 DIAGNOSIS — G893 Neoplasm related pain (acute) (chronic): Secondary | ICD-10-CM | POA: Diagnosis not present

## 2020-03-10 DIAGNOSIS — G894 Chronic pain syndrome: Secondary | ICD-10-CM | POA: Diagnosis not present

## 2020-03-10 DIAGNOSIS — Z79891 Long term (current) use of opiate analgesic: Secondary | ICD-10-CM | POA: Diagnosis not present

## 2020-03-11 DIAGNOSIS — C50412 Malignant neoplasm of upper-outer quadrant of left female breast: Secondary | ICD-10-CM | POA: Diagnosis not present

## 2020-03-20 ENCOUNTER — Other Ambulatory Visit: Payer: Self-pay | Admitting: Family Medicine

## 2020-03-22 DIAGNOSIS — G4733 Obstructive sleep apnea (adult) (pediatric): Secondary | ICD-10-CM | POA: Diagnosis not present

## 2020-03-23 ENCOUNTER — Other Ambulatory Visit: Payer: Self-pay | Admitting: Family Medicine

## 2020-03-28 ENCOUNTER — Ambulatory Visit: Payer: Medicare Other | Admitting: Podiatry

## 2020-04-05 NOTE — Progress Notes (Signed)
  Radiation Oncology         [phone] ________________________________  Name: Shelby Anderson MRN: [id number]  Date: 02/08/2020  DOB: 04/24/1956  End of Treatment Note  Diagnosis:   left-sided breast cancer     Indication for treatment:  Curative       Radiation treatment dates:   01/12/20 - 02/08/20  Site/dose:   The patient initially received a dose of 42.56 Gy in 16 fractions to the breast using whole-breast tangent fields. This was delivered using a 3-D conformal technique. The patient then received a boost to the seroma. This delivered an additional 8 Gy in 8fractions using a 3 field photon technique due to the depth of the seroma. The total dose was 50.56 Gy.  Narrative: The patient tolerated radiation treatment relatively well.   The patient had some expected skin irritation as she progressed during treatment. Moist desquamation was not present at the end of treatment.  Plan: The patient has completed radiation treatment. The patient will return to radiation oncology clinic for routine followup in one month. I advised the patient to call or return sooner if they have any questions or concerns related to their recovery or treatment. ________________________________  Jodelle Gross, M.D., Ph.D.

## 2020-04-06 ENCOUNTER — Other Ambulatory Visit: Payer: Self-pay | Admitting: *Deleted

## 2020-04-06 NOTE — Patient Outreach (Signed)
[name]) Care Management  04/06/2020  Shelby Anderson [birth year] [id number]   Call placed to member to follow up on management of current medical conditions, no answer.  Unable to leave message, will follow up within the next 3-4 business days.  Valente David, [place], MSN [place] [phone]

## 2020-04-07 DIAGNOSIS — M47816 Spondylosis without myelopathy or radiculopathy, lumbar region: Secondary | ICD-10-CM | POA: Diagnosis not present

## 2020-04-07 DIAGNOSIS — Z79891 Long term (current) use of opiate analgesic: Secondary | ICD-10-CM | POA: Diagnosis not present

## 2020-04-07 DIAGNOSIS — G894 Chronic pain syndrome: Secondary | ICD-10-CM | POA: Diagnosis not present

## 2020-04-07 DIAGNOSIS — G893 Neoplasm related pain (acute) (chronic): Secondary | ICD-10-CM | POA: Diagnosis not present

## 2020-04-11 ENCOUNTER — Other Ambulatory Visit: Payer: Self-pay | Admitting: Internal Medicine

## 2020-04-11 ENCOUNTER — Other Ambulatory Visit: Payer: Self-pay | Admitting: *Deleted

## 2020-04-11 NOTE — Patient Outreach (Signed)
[name]) Care Management  04/11/2020  Shelby Anderson 04/05/1956 [id number]   Outreach attempt #2.  Call placed to member to follow up on management of current medical conditions, no answer.  Unable to leave message.  Will send unsuccessful outreach letter and follow up within the next 3-4 business days.  Valente David, [place], MSN [place] [phone]

## 2020-04-14 ENCOUNTER — Other Ambulatory Visit: Payer: Self-pay | Admitting: *Deleted

## 2020-04-14 NOTE — Patient Outreach (Signed)
[place] [facility]) Care Management  04/14/2020  Shelby Anderson Jul 10, 1956 [id number]   Outreach attempt #3, unsuccessful, unable to leave message.  Unsuccessful letter was sent on 5/10.   If no call back by 5/19, will close case due to inability to maintain contact.  Valente David, [place], MSN [place] [phone]

## 2020-04-20 DIAGNOSIS — D3132 Benign neoplasm of left choroid: Secondary | ICD-10-CM | POA: Diagnosis not present

## 2020-04-20 DIAGNOSIS — E119 Type 2 diabetes mellitus without complications: Secondary | ICD-10-CM | POA: Diagnosis not present

## 2020-04-20 DIAGNOSIS — H2513 Age-related nuclear cataract, bilateral: Secondary | ICD-10-CM | POA: Diagnosis not present

## 2020-04-20 DIAGNOSIS — H04123 Dry eye syndrome of bilateral lacrimal glands: Secondary | ICD-10-CM | POA: Diagnosis not present

## 2020-04-21 ENCOUNTER — Other Ambulatory Visit: Payer: Self-pay | Admitting: *Deleted

## 2020-04-21 DIAGNOSIS — C50412 Malignant neoplasm of upper-outer quadrant of left female breast: Secondary | ICD-10-CM | POA: Diagnosis not present

## 2020-04-21 NOTE — Patient Outreach (Signed)
[place] [facility]) Care Management  04/21/2020  Shelby Anderson 1956-01-14 [id number]   No response from member after multiple unsuccessful outreach attempts and letter sent.  Will close case at this time due to inability to maintain contact.  Will notify member and primary MD of case closure.  Valente David, [place], MSN [place] [phone]

## 2020-05-04 ENCOUNTER — Other Ambulatory Visit: Payer: Self-pay | Admitting: Family Medicine

## 2020-05-06 DIAGNOSIS — M47816 Spondylosis without myelopathy or radiculopathy, lumbar region: Secondary | ICD-10-CM | POA: Diagnosis not present

## 2020-05-06 DIAGNOSIS — G894 Chronic pain syndrome: Secondary | ICD-10-CM | POA: Diagnosis not present

## 2020-05-06 DIAGNOSIS — Z79891 Long term (current) use of opiate analgesic: Secondary | ICD-10-CM | POA: Diagnosis not present

## 2020-05-06 DIAGNOSIS — G893 Neoplasm related pain (acute) (chronic): Secondary | ICD-10-CM | POA: Diagnosis not present

## 2020-05-14 ENCOUNTER — Other Ambulatory Visit: Payer: Self-pay | Admitting: Family Medicine

## 2020-05-16 NOTE — Telephone Encounter (Signed)
30 DAY SUPPLY SENT TO THE PHARMACY WITH MESSAGE THAT PT SHOULD SCHEDULE CPX.

## 2020-05-16 NOTE — Telephone Encounter (Signed)
[place] TO THE PHARMACY WITH MESSAGE THAT PT IS DUE FOR CPX/LAB WORK.

## 2020-05-18 ENCOUNTER — Other Ambulatory Visit: Payer: Self-pay

## 2020-05-18 ENCOUNTER — Inpatient Hospital Stay: Payer: Medicare Other | Attending: Oncology | Admitting: Adult Health

## 2020-05-18 VITALS — BP 112/68 | HR 73 | Temp 98.7°F | Resp 18 | Ht 66.0 in | Wt 221.7 lb

## 2020-05-18 DIAGNOSIS — E785 Hyperlipidemia, unspecified: Secondary | ICD-10-CM | POA: Insufficient documentation

## 2020-05-18 DIAGNOSIS — J45909 Unspecified asthma, uncomplicated: Secondary | ICD-10-CM | POA: Insufficient documentation

## 2020-05-18 DIAGNOSIS — Z8049 Family history of malignant neoplasm of other genital organs: Secondary | ICD-10-CM | POA: Diagnosis not present

## 2020-05-18 DIAGNOSIS — Z808 Family history of malignant neoplasm of other organs or systems: Secondary | ICD-10-CM | POA: Diagnosis not present

## 2020-05-18 DIAGNOSIS — K219 Gastro-esophageal reflux disease without esophagitis: Secondary | ICD-10-CM | POA: Insufficient documentation

## 2020-05-18 DIAGNOSIS — I10 Essential (primary) hypertension: Secondary | ICD-10-CM | POA: Diagnosis not present

## 2020-05-18 DIAGNOSIS — Z9221 Personal history of antineoplastic chemotherapy: Secondary | ICD-10-CM | POA: Insufficient documentation

## 2020-05-18 DIAGNOSIS — E2839 Other primary ovarian failure: Secondary | ICD-10-CM | POA: Diagnosis not present

## 2020-05-18 DIAGNOSIS — I4891 Unspecified atrial fibrillation: Secondary | ICD-10-CM | POA: Diagnosis not present

## 2020-05-18 DIAGNOSIS — Z171 Estrogen receptor negative status [ER-]: Secondary | ICD-10-CM | POA: Diagnosis not present

## 2020-05-18 DIAGNOSIS — Z79899 Other long term (current) drug therapy: Secondary | ICD-10-CM | POA: Insufficient documentation

## 2020-05-18 DIAGNOSIS — Z7982 Long term (current) use of aspirin: Secondary | ICD-10-CM | POA: Diagnosis not present

## 2020-05-18 DIAGNOSIS — Z800 Family history of malignant neoplasm of digestive organs: Secondary | ICD-10-CM | POA: Insufficient documentation

## 2020-05-18 DIAGNOSIS — Z85828 Personal history of other malignant neoplasm of skin: Secondary | ICD-10-CM | POA: Insufficient documentation

## 2020-05-18 DIAGNOSIS — E119 Type 2 diabetes mellitus without complications: Secondary | ICD-10-CM | POA: Insufficient documentation

## 2020-05-18 DIAGNOSIS — G4733 Obstructive sleep apnea (adult) (pediatric): Secondary | ICD-10-CM | POA: Insufficient documentation

## 2020-05-18 DIAGNOSIS — M797 Fibromyalgia: Secondary | ICD-10-CM | POA: Diagnosis not present

## 2020-05-18 DIAGNOSIS — C50412 Malignant neoplasm of upper-outer quadrant of left female breast: Secondary | ICD-10-CM | POA: Diagnosis not present

## 2020-05-18 DIAGNOSIS — Z853 Personal history of malignant neoplasm of breast: Secondary | ICD-10-CM | POA: Insufficient documentation

## 2020-05-18 DIAGNOSIS — Z7984 Long term (current) use of oral hypoglycemic drugs: Secondary | ICD-10-CM | POA: Diagnosis not present

## 2020-05-18 DIAGNOSIS — Z923 Personal history of irradiation: Secondary | ICD-10-CM | POA: Insufficient documentation

## 2020-05-18 DIAGNOSIS — Z8701 Personal history of pneumonia (recurrent): Secondary | ICD-10-CM | POA: Insufficient documentation

## 2020-05-18 DIAGNOSIS — I252 Old myocardial infarction: Secondary | ICD-10-CM | POA: Insufficient documentation

## 2020-05-18 NOTE — Telephone Encounter (Signed)
Yes the patient was seen on 02/19/20 by Rexene Edison if she now needs supplies then we can sign for them

## 2020-05-18 NOTE — Telephone Encounter (Signed)
Shelby Anderson, please advise if this has been taken care of and if we can close this encounter.

## 2020-05-18 NOTE — Progress Notes (Signed)
SURVIVORSHIP VISIT:   BRIEF ONCOLOGIC HISTORY:  Oncology History  Malignant neoplasm of upper-outer quadrant of left breast in female, estrogen receptor negative ([place])  09/07/2019 Initial Diagnosis   Routine screening mammogram detected a 2.1cm mass in the left breast and no left axillary adenopathy. 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   09/15/2019 Genetic Testing   Genetic testing reported out on September 15, 2019 through the Multi-cancer panel found no pathogenic mutations. The Multi-Gene Panel offered by [facility] includes sequencing and/or deletion duplication testing of the following 85 genes: AIP, ALK, APC, ATM, AXIN2,BAP1,  BARD1, BLM, BMPR1A, BRCA1, BRCA2, BRIP1, CASR, CDC73, CDH1, CDK4, CDKN1B, CDKN1C, CDKN2A (p14ARF), CDKN2A (p16INK4a), CEBPA, CHEK2, CTNNA1, DICER1, DIS3L2, EGFR (c.2369C>T, p.Thr790Met variant only), EPCAM (Deletion/duplication testing only), FH, FLCN, GATA2, GPC3, GREM1 (Promoter region deletion/duplication testing only), HOXB13 (c.251G>A, p.Gly84Glu), HRAS, KIT, MAX, MEN1, MET, MITF (c.952G>A, p.Glu318Lys variant only), MLH1, MSH2, MSH3, MSH6, MUTYH, NBN, NF1, NF2, NTHL1, PALB2, PDGFRA, PHOX2B, PMS2, POLD1, POLE, POT1, PRKAR1A, PTCH1, PTEN, RAD50, RAD51C, RAD51D, RB1, RECQL4, RET, RNF43, RUNX1, SDHAF2, SDHA (sequence changes only), SDHB, SDHC, SDHD, SMAD4, SMARCA4, SMARCB1, SMARCE1, STK11, SUFU, TERC, TERT, TMEM127, TP53, TSC1, TSC2, VHL, WRN and WT1.  The test report has been scanned into EPIC and is located under the Molecular Pathology section of the Results Review tab.  A portion of the result report is included below for reference.    09/24/2019 - 10/15/2019 Chemotherapy   palonosetron (ALOXI) injection 0.25 mg, 0.25 mg, Intravenous,  Once, 2 of 6 cycles. Administration: 0.25 mg (09/24/2019), 0.25 mg (10/15/2019)  pegfilgrastim (NEULASTA ONPRO KIT) injection 6 mg, 6 mg, Subcutaneous, Once, 2 of 6 cycles. Administration: 6 mg  (09/24/2019), 6 mg (10/15/2019)  cyclophosphamide (CYTOXAN) 1,100 mg in sodium chloride 0.9 % 250 mL chemo infusion, 500 mg/m2 = 1,100 mg (83.3 % of original dose 600 mg/m2), Intravenous,  Once, 2 of 6 cycles. Dose modification: 500 mg/m2 (original dose 600 mg/m2, Cycle 1, Reason: Provider Judgment). Administration: 1,100 mg (09/24/2019), 1,100 mg (10/15/2019)  DOCEtaxel (TAXOTERE) 140 mg in sodium chloride 0.9 % 250 mL chemo infusion, 65 mg/m2 = 140 mg (86.7 % of original dose 75 mg/m2), Intravenous,  Once, 2 of 6 cycles. Dose modification: 65 mg/m2 (original dose 75 mg/m2, Cycle 1, Reason: Provider Judgment). Administration: 140 mg (09/24/2019), 140 mg (10/15/2019)  fosaprepitant (EMEND) 150 mg in sodium chloride 0.9 % 145 mL IVPB, Intravenous,  Once, 2 of 6 cycles. Administration:  (09/24/2019),  (10/15/2019)   12/09/2019 Surgery   Left lumpectomy Shelby Anderson) [phone]): microscopic focus of residual IDC, grade 3, with high grade DCIS, clear margins. No regional lymph nodes were examined.   12/10/2019 Cancer Staging   Staging form: Breast, AJCC 8th Edition - Pathologic stage from 12/10/2019: No Stage Recommended (ypT1a, pN0, cM0) - Signed by Gardenia Phlegm, NP on 12/23/2019   01/13/2020 - 02/08/2020 Radiation Therapy   The patient initially received a dose of 42.56 Gy in 16 fractions to the breast using whole-breast tangent fields. This was delivered using a 3-D conformal technique. The pt received a boost delivering an additional 8 Gy in 4 fractions using a electron boost with 37mV electrons. The total dose was 50.56 Gy.     INTERVAL HISTORY:  Shelby Anderson review her survivorship care plan detailing her treatment course for breast cancer, as well as monitoring long-term side effects of that treatment, education regarding health maintenance, screening, and overall wellness and health promotion.  Overall, Shelby Anderson reports feeling moderately well.  She completed her  survivorship survey.  She notes stress, financial concern, memory changes since quitting work since 2012, fatigue at a 5/10, not interfering with every day activities, hot flashes, chronic back pain, sleep difficulty, varying exercise, and varying fruit and vegetable intake depending on her income.  She has never undergone bone density testing.    REVIEW OF SYSTEMS:  Review of Systems  Constitutional: Positive for fatigue. Negative for appetite change, chills, fever and unexpected weight change.  HENT:   Negative for hearing loss and lump/mass.   Eyes: Negative for eye problems and icterus.  Respiratory: Negative for chest tightness, cough and shortness of breath.   Gastrointestinal: Negative for abdominal distention, abdominal pain, constipation, diarrhea, nausea and vomiting.  Endocrine: Negative for hot flashes.  Genitourinary: Negative for difficulty urinating.   Musculoskeletal: Positive for back pain. Negative for arthralgias.  Skin: Negative for itching and rash.  Neurological: Negative for dizziness, extremity weakness, headaches and numbness.  Hematological: Negative for adenopathy. Does not bruise/bleed easily.  Psychiatric/Behavioral: Positive for sleep disturbance (has sleep apnea, wears CPAP). Negative for depression. The patient is not nervous/anxious.   Breast: Denies any new nodularity, masses, tenderness, nipple changes, or nipple discharge.      ONCOLOGY TREATMENT TEAM:  1. Surgeon:  Dr. Donne Anderson at [facility] 2. Medical Oncologist: Dr. Lindi Anderson  3. Radiation Oncologist: Dr. Lisbeth Anderson    PAST MEDICAL/SURGICAL HISTORY:  Past Medical History:  Diagnosis Date  . Allergy   . Anemia    "chronic"  . Angina    Prinzmetal angina  . Anxiety and depression   . Arthritis   . Asthma   . Atrial fibrillation ([place])    h/o "AF w/frequent PVCs"  . Breast cancer ([place])    Left  . Cancer (H[facility])    hx of skin cancer   . CHF (congestive heart failure) ([place])   .  Chronic back pain greater than 3 months duration    on chronic narcotics, treated at pain clinic  . Colon polyps    hyperplastic  . Coronary artery disease    Arrythmia, orthostatic hypotension, HLD, HTN; sees Dr. Einar Gip  . Difficult intubation    "TMJ & woke up when they were still cutting on me"  . Dysrhythmia    sees Dr. Einar Gip and a cardiologist at [facility]  . Esophageal stricture   . Family history of melanoma ]  . Family history of pancreatic cancer   . Fatty liver   . Fatty liver   . Fibroids   . Fibromyalgia    "in my legs"  . GERD (gastroesophageal reflux disease)    hx hiatal hernia, stricture and gastric ulcer  . Headache(784.0)    migraines  . Heart murmur   . Hiatal hernia   . History of loop recorder   . History of migraines    "dx'd when I was in my teens"  . Hyperlipemia   . Hypertension   . Mental disorder   . Mild episode of recurrent major depressive disorder ([place]) 12/06/2015  . Myocardial infarction [facility]) 1980's & 1990;   sees Dr. Einar Gip  . OSA (obstructive sleep apnea)   . Pneumonia    multiple times  . PONV (postoperative nausea and vomiting)   . Recurrent upper respiratory infection (URI)   . Shortness of breath 11/20/11   "all the time", sees pulmonlogy, ? asthma  . Stenotic cervical os   . Stomach ulcer    "  3 small; found in 05/2011"  . TMJ (dislocation of temporomandibular joint)   . Tuberculosis    + TB SKIN TEST  . Type 2 diabetes mellitus without complication, without long-term current use of insulin ([place]) 12/06/2015   not on meds    Past Surgical History:  Procedure Laterality Date  . ACHILLES TENDON REPAIR  1970's   left ankle  . ARTHROSCOPIC REPAIR ACL     left knee cap  . BREAST BIOPSY Right 04/08/2013   again in October or November 2020  . BREAST LUMPECTOMY WITH RADIOACTIVE SEED AND SENTINEL LYMPH NODE BIOPSY Left 12/09/2019   Procedure: LEFT BREAST LUMPECTOMY WITH RADIOACTIVE SEED AND LEFT AXILLARY SENTINEL LYMPH NODE BIOPSY;   Surgeon: Rolm Bookbinder, MD;  Location: [name];  Service: General;  Laterality: Left;  PEC BLOCK  . CARDIAC CATHETERIZATION     loop recorder  . CARPAL TUNNEL RELEASE  unknown   left hand  . COLONOSCOPY    . ESOPHAGOGASTRODUODENOSCOPY (EGD) WITH PROPOFOL N/A 10/29/2017   Procedure: ESOPHAGOGASTRODUODENOSCOPY (EGD) WITH PROPOFOL;  Surgeon: Irene Shipper, MD;  Location: [facility];  Service: Endoscopy;  Laterality: N/A;  . LOOP RECORDER IMPLANT    . PORT-A-CATH REMOVAL N/A 12/09/2019   Procedure: REMOVAL PORT-A-CATH;  Surgeon: Rolm Bookbinder, MD;  Location: [place];  Service: General;  Laterality: N/A;  . PORTACATH PLACEMENT N/A 09/23/2019   Procedure: INSERTION PORT-A-CATH Right Internal Doreen Salvage;  Surgeon: Rolm Bookbinder, MD;  Location: [place];  Service: General;  Laterality: N/A;  . post ganglionectomy  1970's   "for migraine headaches"  . pouch string  [phone]   "did this 3 times (once w/each pregnancy)"  . SAVORY DILATION N/A 10/29/2017   Procedure: SAVORY DILATION;  Surgeon: Irene Shipper, MD;  Location: [facility];  Service: Endoscopy;  Laterality: N/A;  . TOTAL KNEE ARTHROPLASTY Left 09/25/2016   Procedure: LEFT TOTAL KNEE ARTHROPLASTY;  Surgeon: Paralee Cancel, MD;  Location: [facility];  Service: Orthopedics;  Laterality: Left;  . TUBAL LIGATION  1980's     ALLERGIES:  Allergies  Allergen Reactions  . Lodine [Etodolac] Anaphylaxis, Hives and Swelling  . Oxycontin [Oxycodone Hcl] Anaphylaxis    hives, trouble breathing, tongue swelling (Only Oxycontin) Tolerates plain oxycodone.  Marland Kitchen Penicillins Anaphylaxis    Told by a surgeon never to take it again. Has patient had a PCN reaction causing immediate rash, facial/tongue/throat swelling, SOB or lightheadedness with hypotension: Yes Has patient had a PCN reaction causing severe rash involving mucus membranes or skin necrosis: Unknown Has patient had a PCN reaction that required hospitalization: No Has patient  had a PCN reaction occurring within the last 10 years: No If all of the above answers are "NO", then may proceed with Cephalosporin use.  . Aspirin Other (See Comments)    High-dose caused GI Bleeds  . Darvocet [Propoxyphene N-Acetaminophen] Hives  . Nitroglycerin Other (See Comments)    IV-BP drops dramatically Can take po  . Tramadol Hives and Itching  . Ultram [Tramadol Hcl] Hives  . Valium Other (See Comments)    Circulation problems. "Legs turned black".     CURRENT MEDICATIONS:  Outpatient Encounter Medications as of 05/18/2020  Medication Sig  . albuterol (PROVENTIL) (2.5 MG/3ML) 0.083% nebulizer solution Take 3 mLs (2.5 mg total) by nebulization every 6 (six) hours as needed for wheezing or shortness of breath (J45.40). (Patient taking differently: Take 2.5 mg by nebulization every 6 (six) hours as needed for wheezing or shortness of breath. )  .  albuterol (VENTOLIN HFA) 108 (90 Base) MCG/ACT inhaler Inhale 2 puffs into the lungs every 6 (six) hours as needed for wheezing or shortness of breath.  Marland Kitchen aspirin EC 81 MG tablet Take 81 mg at bedtime by mouth.   Marland Kitchen atorvastatin (LIPITOR) 40 MG tablet Take 1 tablet by mouth everyday at bedtime.  **DUE FOR YEARLY PHYSICAL**  . Blood Glucose Monitoring Suppl (ONE TOUCH ULTRA 2) w/Device KIT USE TO CHECK BLOOD SUGAR 2 TIMES A DAY  . budesonide-formoterol (SYMBICORT) 80-4.5 MCG/ACT inhaler INHALE 2 PUFFS INTO THE LUNGS EVERY MORNING AND ANOTHER 2 PUFFS 12 HOURS LATER  . Calcium Carbonate-Vitamin D 600-200 MG-UNIT TABS Take 1 tablet by mouth daily.  Marland Kitchen CINNAMON PO Take 1,000 mg 2 (two) times daily by mouth.  . clobetasol cream (TEMOVATE) 0.05 % Apply topically 2 (two) times daily. (Patient taking differently: Apply topically daily as needed (rash). )  . doxepin (SINEQUAN) 10 MG capsule Take 20 mg by mouth at bedtime.   Marland Kitchen esomeprazole (NEXIUM) 40 MG capsule Take 1 capsule (40 mg total) by mouth 2 (two) times daily. (Patient taking differently:  Take 80 mg by mouth 2 (two) times daily. )  . famotidine (PEPCID) 20 MG tablet Take 1 tablet (20 mg total) by mouth 2 (two) times daily as needed for heartburn or indigestion.  . fluticasone (FLONASE) 50 MCG/ACT nasal spray Place 2 sprays at bedtime as needed into both nostrils for allergies or rhinitis.   . furosemide (LASIX) 20 MG tablet TAKE 1-2 TABLETS BY MOUTH DAILY AS DIRECTED.  Marland Kitchen glipiZIDE (GLUCOTROL XL) 5 MG 24 hr tablet Take 1 tablet by mouth everyday with breakfast.  **DUE FOR YEARLY PHYSICAL**  . L-Methylfolate (DEPLIN) 7.5 MG TABS Take 7.5 mg by mouth daily with breakfast.   . Lancets (ONETOUCH DELICA PLUS [id number]) MISC USE TO CHECK BLOOD SUGAR 2 TIMES A DAY  . LYRICA 150 MG capsule Take 150 mg by mouth 3 (three) times daily.   . metFORMIN (GLUCOPHAGE) 500 MG tablet TAKE 1 TABLET BY MOUTH TWICE A DAY WITH MEALS  . Misc Natural Products (GLUCOS-CHONDROIT-MSM COMPLEX PO) Take 1 tablet 3 (three) times daily by mouth.   . montelukast (SINGULAIR) 10 MG tablet TAKE 1 TABLET BY MOUTH EVERYDAY AT BEDTIME  . Multiple Vitamin (MULITIVITAMIN WITH MINERALS) TABS Take 1 tablet by mouth daily.    . mupirocin cream (BACTROBAN) 2 % Apply once daily to lesions on feet as needed.  . nitroGLYCERIN (NITROSTAT) 0.4 MG SL tablet Place 0.4 mg under the tongue every 5 (five) minutes as needed for chest pain.   Marland Kitchen ondansetron (ZOFRAN) 4 MG tablet Take 4 mg by mouth every 8 (eight) hours as needed for nausea or vomiting.  Glory Rosebush ULTRA test strip USE TO CHECK BLOOD SUGAR 2 TIMES A DAY  . Oxycodone HCl 20 MG TABS Take 20 mg every 4 (four) hours as needed by mouth (pain).   . OXYGEN Inhale 2 L into the lungs at bedtime as needed (shortness of breath).   . potassium chloride (KLOR-CON) 10 MEQ tablet Take 2 tablets (20 mEq total) by mouth daily.  . promethazine (PHENERGAN) 25 MG tablet TAKE 1 TABLET (25 MG TOTAL) BY MOUTH EVERY 6 (SIX) HOURS AS NEEDED FOR NAUSEA OR VOMITING.  . sucralfate (CARAFATE) 1 g tablet  Take 1 tablet (1 g total) by mouth 4 (four) times daily -  with meals and at bedtime. Before taking slowly dissolve tablet in 1 Tablespoon of distilled water for about 15  minutes to creat a slurry. (Patient taking differently: Take 1 g by mouth 4 (four) times daily. Before taking slowly dissolve tablet in 1 Tablespoon of distilled water for about 15 minutes to creat a slurry.)  . tiZANidine (ZANAFLEX) 4 MG tablet Take 4 mg by mouth every 8 (eight) hours as needed for muscle spasms.  . vitamin B-12 (CYANOCOBALAMIN) 500 MCG tablet Take 500 mcg by mouth daily.    No facility-administered encounter medications on file as of 05/18/2020.     ONCOLOGIC FAMILY HISTORY:  Family History  Problem Relation Age of Onset  . Malignant hyperthermia Father   . Hypertension Father   . Heart disease Father   . Diabetes Father   . Cancer Father        skin  . Hypertension Mother   . Heart disease Mother   . Multiple myeloma Mother   . Cancer Sister        CERVICAL  . Hypertension Sister   . Cancer Brother 22       MELANOMA  . Heart disease Maternal Grandmother   . Other Maternal Grandmother 32       complications of childbirth  . Heart disease Maternal Grandfather   . Cancer Paternal Grandmother        ?   Marland Kitchen Heart disease Paternal Grandmother   . Heart disease Paternal Grandfather   . Cancer Brother        LUNG  . Diabetes Sister   . Hypertension Sister   . Heart disease Sister   . Cancer Sister   . Cancer Brother   . Pancreatic cancer Niece 32  . Cancer Nephew 40       unknown- currently in the [employer]  . Anesthesia problems Neg Hx   . Hypotension Neg Hx   . Pseudochol deficiency Neg Hx   . Colon cancer Neg Hx   . Esophageal cancer Neg Hx   . Rectal cancer Neg Hx   . Stomach cancer Neg Hx   . Breast cancer Neg Hx      GENETIC COUNSELING/TESTING: See above  SOCIAL HISTORY:  Social History   Socioeconomic History  . Marital status: Married    Spouse name: Not on file  .  Number of children: 3  . Years of education: 70  . Highest education level: Some college, no degree  Occupational History  . Occupation: Retired Therapist, sports  Tobacco Use  . Smoking status: Never Smoker  . Smokeless tobacco: Never Used  Vaping Use  . Vaping Use: Never used  Substance and Sexual Activity  . Alcohol use: No    Alcohol/week: 0.0 standard drinks  . Drug use: No  . Sexual activity: Not Currently    Birth control/protection: Surgical    Comment: 1st intercourse 64 yo-Fewer than 5 partners  Other Topics Concern  . Not on file  Social History Narrative   Right handed   One story home   3 children   Has custody of 2 grandchildren ages 25 & 63    Social Determinants of Health   Financial Resource Strain: High Risk  . Difficulty of Paying Living Expenses: Very hard  Food Insecurity: Food Insecurity Present  . Worried About Charity fundraiser in the Last Year: Sometimes true  . Ran Out of Food in the Last Year: Sometimes true  Transportation Needs: No Transportation Needs  . Lack of Transportation (Medical): No  . Lack of Transportation (Non-Medical): No  Physical Activity: Inactive  . Days  of Exercise per Week: 0 days  . Minutes of Exercise per Session: 0 min  Stress: Stress Concern Present  . Feeling of Stress : Very much  Social Connections: Unknown  . Frequency of Communication with Friends and Family: More than three times a week  . Frequency of Social Gatherings with Friends and Family: Three times a week  . Attends Religious Services: Not on file  . Active Member of Clubs or Organizations: Not on file  . Attends Archivist Meetings: Not on file  . Marital Status: Married  Human resources officer Violence: Unknown  . Fear of Current or Ex-Partner: Patient refused  . Emotionally Abused: Patient refused  . Physically Abused: Patient refused  . Sexually Abused: Patient refused     OBSERVATIONS/OBJECTIVE:  BP 112/68   Pulse 73   Temp 98.7 F (37.1 C)   Resp  18   Ht '5\' 6"'  (1.676 m)   Wt 221 lb 11.2 oz (100.6 kg)   SpO2 92%   BMI 35.78 kg/m  GENERAL: Patient is a well appearing female in no acute distress HEENT:  Sclerae anicteric.  Oropharynx clear and moist. No ulcerations or evidence of oropharyngeal candidiasis. Neck is supple.  NODES:  No cervical, supraclavicular, or axillary lymphadenopathy palpated.  BREAST EXAM:  Right breast benign, left breast s/p lumpectomy and radiation, no sign of local recurrence. LUNGS:  Clear to auscultation bilaterally.  No wheezes or rhonchi. HEART:  Regular rate and rhythm. No murmur appreciated. ABDOMEN:  Soft, nontender.  Positive, normoactive bowel sounds. No organomegaly palpated. MSK:  No focal spinal tenderness to palpation. Full range of motion bilaterally in the upper extremities. EXTREMITIES:  No peripheral edema.   SKIN:  Clear with no obvious rashes or skin changes. No nail dyscrasia. NEURO:  Nonfocal. Well oriented.  Appropriate affect.    LABORATORY DATA:  None for this visit.  DIAGNOSTIC IMAGING:  None for this visit.      ASSESSMENT AND PLAN:  Shelby Anderson is a pleasant 64 y.o. female with Stage IIB left breast invasive ductal carcinoma, ER-/PR-/HER2-, diagnosed in 09/2019, treated with neoadjuvant chemotherapy,  lumpectomy, and adjuvant radiation therapy. She presents to the Survivorship Clinic for our initial meeting and routine follow-up post-completion of treatment for breast cancer.    1. Stage IIB right/left breast cancer:  Shelby Anderson is continuing to recover from definitive treatment for breast cancer. She will follow-up with her medical oncologist, Dr. Lindi Anderson in 5 months with history and physical exam per surveillance protocol. Her mammogram is due 09/2020; orders placed today.  She is concerned about some areas in her breast, and is following closely with Dr. Donne Anderson about this.  I do not palpate any areas today on exam that feel like a breast cancer recurrence.   Today, a comprehensive survivorship care plan and treatment summary was reviewed with the patient today detailing her breast cancer diagnosis, treatment course, potential late/long-term effects of treatment, appropriate follow-up care with recommendations for the future, and patient education resources.  A copy of this summary, along with a letter will be sent to the patient's primary care provider via mail/fax/In Basket message after today's visit.    2.  Positive distress screen: QOL CSV given.  Will receive call from social work.  3. Other survey results: These are related to chronic interventions and do not require intervention.  4. Bone health:    She will undergo bone density testing in October when she has her mammogram.  She was given education  on specific activities to promote bone health.  5. Cancer screening:  Due to Shelby Anderson's history and her age, she should receive screening for skin cancers, colon cancer, and gynecologic cancers.  The information and recommendations are listed on the patient's comprehensive care plan/treatment summary and were reviewed in detail with the patient.    6. Health maintenance and wellness promotion: Shelby Anderson was encouraged to consume 5-7 servings of fruits and vegetables per day. We reviewed the "Nutrition Rainbow" handout, as well as the handout "Take Control of Your Health and Reduce Your Cancer Risk" from the [place].  She was also encouraged to engage in moderate to vigorous exercise for 30 minutes per day most days of the week. We discussed the [employer] fitness program, which is designed for cancer survivors to help them become more physically fit after cancer treatments.  She was instructed to limit her alcohol consumption and continue to abstain from tobacco use.     7. Support services/counseling: It is not uncommon for this period of the patient's cancer care trajectory to be one of many emotions and stressors.  We  discussed how this can be increasingly difficult during the times of quarantine and social distancing due to the COVID-19 pandemic.   She was given information regarding our available services and encouraged to contact me with any questions or for help enrolling in any of our support group/programs.    Follow up instructions:    -Return to cancer center 10/2020  -Mammogram due in 09/2020 -Bone density 09/2020 -Follow up with surgery 07/2020 -She is welcome to return back to the Survivorship Clinic at any time; no additional follow-up needed at this time.  -Consider referral back to survivorship as a long-term survivor for continued surveillance  She knows to call for any questions that may arise between now and her next appointment.  We are happy to see her sooner if needed.  Total encounter time: 45 minutes*  Wilber Bihari, NP 05/18/20 11:05 AM Medical Oncology and Hematology [facility] [place], [place] [address] Tel. [phone]    Fax. [phone]   *Total Encounter Time as defined by the Centers for Medicare and Medicaid Services includes, in addition to the face-to-face time of a patient visit (documented in the note above) non-face-to-face time: obtaining and reviewing outside history, ordering and reviewing medications, tests or procedures, care coordination (communications with other health care professionals or caregivers) and documentation in the medical record.

## 2020-05-19 ENCOUNTER — Telehealth: Payer: Self-pay | Admitting: Adult Health

## 2020-05-19 NOTE — Telephone Encounter (Signed)
Scheduled appts per 6/16 los. Pt confirmed appt date and time.

## 2020-05-20 ENCOUNTER — Encounter: Payer: Self-pay | Admitting: Licensed Clinical Social Worker

## 2020-05-20 NOTE — Progress Notes (Signed)
[facility] Quality of Life Screening Clinical Social Work  Clinical Social Work was referred by survivorship quality of life screening protocol.  The patient scored a 28.13 on the Quality of Life/ Cancer Survivor (QOL-CS) scale which indicates moderate quality of life.   Clinical Social Worker attempted to contact patient by phone to assess for needs. No answer, left VM.  Based on screener, depression, fear of 2nd cancer or recurrence, isolation, and financial burden are concerns.    CSW will attempt to reach patient again at a later date.     Trace Wirick E, LCSW

## 2020-06-15 ENCOUNTER — Other Ambulatory Visit: Payer: Self-pay | Admitting: Family Medicine

## 2020-06-21 DIAGNOSIS — G4733 Obstructive sleep apnea (adult) (pediatric): Secondary | ICD-10-CM | POA: Diagnosis not present

## 2020-06-29 ENCOUNTER — Other Ambulatory Visit: Payer: Self-pay | Admitting: Family Medicine

## 2020-06-30 DIAGNOSIS — G893 Neoplasm related pain (acute) (chronic): Secondary | ICD-10-CM | POA: Diagnosis not present

## 2020-06-30 DIAGNOSIS — G894 Chronic pain syndrome: Secondary | ICD-10-CM | POA: Diagnosis not present

## 2020-06-30 DIAGNOSIS — M47816 Spondylosis without myelopathy or radiculopathy, lumbar region: Secondary | ICD-10-CM | POA: Diagnosis not present

## 2020-06-30 DIAGNOSIS — Z79891 Long term (current) use of opiate analgesic: Secondary | ICD-10-CM | POA: Diagnosis not present

## 2020-07-12 ENCOUNTER — Other Ambulatory Visit: Payer: Self-pay | Admitting: Internal Medicine

## 2020-07-13 ENCOUNTER — Other Ambulatory Visit: Payer: Self-pay | Admitting: Family Medicine

## 2020-07-14 ENCOUNTER — Other Ambulatory Visit: Payer: Self-pay | Admitting: Family Medicine

## 2020-07-27 ENCOUNTER — Other Ambulatory Visit: Payer: Self-pay | Admitting: Family Medicine

## 2020-07-29 ENCOUNTER — Ambulatory Visit: Payer: Medicare Other | Admitting: Adult Health

## 2020-07-29 ENCOUNTER — Encounter: Payer: Self-pay | Admitting: Adult Health

## 2020-07-29 ENCOUNTER — Other Ambulatory Visit: Payer: Self-pay

## 2020-07-29 DIAGNOSIS — C50412 Malignant neoplasm of upper-outer quadrant of left female breast: Secondary | ICD-10-CM | POA: Diagnosis not present

## 2020-07-29 DIAGNOSIS — G4733 Obstructive sleep apnea (adult) (pediatric): Secondary | ICD-10-CM | POA: Diagnosis not present

## 2020-07-29 DIAGNOSIS — J4530 Mild persistent asthma, uncomplicated: Secondary | ICD-10-CM

## 2020-07-29 NOTE — Progress Notes (Signed)
'@Patient'  ID: Shelby Anderson, female    DOB: 1956-06-20, 64 y.o.   MRN: [id number]  Chief Complaint  Patient presents with  . 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  Treatment stopped December 2020.  Status post left breast lumpectomy January 2021.  Status post XRT -unable to tolerate chemo- drug induced pneumonitis .  Medical history significant for diabetes, chronic back pain, A. Fib, previous positive PPD Disabled RN  TEST/Events :  Spirometry 12/2011 - no airway obstruction -No reversibility with bronchodilator , mod restriction  Spirometry8/2019FVC 1.64 (46%), FEV1 1.34 (49%), ratio 82 (104%)  07/2018 FENO 11  Home sleep test 07/2012 - showed desatn,no OSA  11/2015 ONO positive , Started on oxygen 2l/m .  10/2016 CT angio neg PE  CT sinus 01/2017 >> clear  12/2013 -admitted asthma exacerbation,CAP, RSV bronchitis.  EGD 03/2011 ([facility]) - peptic ulcer disease.  12/2013 barium esophagram - distal esophageal stricture,stricture dilated again 2017,2018([facility]) 01/2017 Swallow >>Persistent/recurrent stricture at the GE junction  HST 08/2018 AHI 9/h 09/2018 >> CPAP 10 cm, med FF mask  07/29/2020 Follow up : Asthma , OSA  Patient returns for a 6 month follow up . She has underlying asthma. Remains on Symbicort and Singulair. Uses albuterol on occasion.  She says overall she feels like her breathing has been doing well.  She says she did have some trouble when it was more smoky outside but says overall feels like her breathing's been doing good.  She denies any flare of cough or wheezing.  Patient has underlying OSA on CPAP . Wears it all night long. Never misses a night . CPAP  download shows excellent compliance with 100% usage. Daily avg usage at 6.5 hrs. On CPAP 10cmH2O.  Says she is doing well on CPAP feels that she is pretty rested.  Raising her 2 grandchildren school age.  Under a lot of stress.  Working on weight loss.     Allergies  Allergen Reactions  . Lodine [Etodolac] Anaphylaxis, Hives and Swelling  . Oxycontin [Oxycodone Hcl] Anaphylaxis    hives, trouble breathing, tongue swelling (Only Oxycontin) Tolerates plain oxycodone.  Marland Kitchen Penicillins Anaphylaxis    Told by a surgeon never to take it again. Has patient had a PCN reaction causing immediate rash, facial/tongue/throat swelling, SOB or lightheadedness with hypotension: Yes Has patient had a PCN reaction causing severe rash involving mucus membranes or skin necrosis: Unknown Has patient had a PCN reaction that required hospitalization: No Has patient had a PCN reaction occurring within the last 10 years: No If all of the above answers are "NO", then may proceed with Cephalosporin use.  . Aspirin Other (See Comments)    High-dose caused GI Bleeds  . Darvocet [Propoxyphene N-Acetaminophen] Hives  . Nitroglycerin Other (See Comments)    IV-BP drops dramatically Can take po  . Tramadol Hives and Itching  . Ultram [Tramadol Hcl] Hives  . Valium Other (See Comments)    Circulation problems. "Legs turned black".    Immunization History  Administered Date(s) Administered  . Influenza Split 08/19/2011  . Influenza Whole 01/03/2013  . Influenza,inj,Quad PF,6+ Mos 08/20/2013, 09/15/2014, 08/26/2015, 08/01/2016, 08/22/2017, 10/15/2018, 09/24/2019  . Influenza-Unspecified 08/03/2016  . Pneumococcal Polysaccharide-23 08/19/2011, 12/12/2017  . Tdap 06/28/2014  Past Medical History:  Diagnosis Date  . Allergy   . Anemia    "chronic"  . Angina    Prinzmetal angina  . Anxiety and depression   . Arthritis   . Asthma   . Atrial fibrillation ([place])    h/o "AF w/frequent PVCs"  . Breast  cancer ([place])    Left  . Cancer ([facility])    hx of skin cancer   . CHF (congestive heart failure) ([place])   . Chronic back pain greater than 3 months duration    on chronic narcotics, treated at pain clinic  . Colon polyps    hyperplastic  . Coronary artery disease    Arrythmia, orthostatic hypotension, HLD, HTN; sees Dr. Einar Gip  . Difficult intubation    "TMJ & woke up when they were still cutting on me"  . Dysrhythmia    sees Dr. Einar Gip and a cardiologist at [facility]  . Esophageal stricture   . Family history of melanoma ]  . Family history of pancreatic cancer   . Fatty liver   . Fatty liver   . Fibroids   . Fibromyalgia    "in my legs"  . GERD (gastroesophageal reflux disease)    hx hiatal hernia, stricture and gastric ulcer  . Headache(784.0)    migraines  . Heart murmur   . Hiatal hernia   . History of loop recorder   . History of migraines    "dx'd when I was in my teens"  . Hyperlipemia   . Hypertension   . Mental disorder   . Mild episode of recurrent major depressive disorder ([place]) 12/06/2015  . Myocardial infarction [facility]) 1980's & 1990;   sees Dr. Einar Gip  . OSA (obstructive sleep apnea)   . Pneumonia    multiple times  . PONV (postoperative nausea and vomiting)   . Recurrent upper respiratory infection (URI)   . Shortness of breath 11/20/11   "all the time", sees pulmonlogy, ? asthma  . Stenotic cervical os   . Stomach ulcer    "3 small; found in 05/2011"  . TMJ (dislocation of temporomandibular joint)   . Tuberculosis    + TB SKIN TEST  . Type 2 diabetes mellitus without complication, without long-term current use of insulin ([place]) 12/06/2015   not on meds     Tobacco History: Social History   Tobacco Use  Smoking Status Never Smoker  Smokeless Tobacco Never Used   Counseling given: Not Answered   Outpatient Medications Prior to Visit  Medication Sig Dispense Refill  . albuterol (PROVENTIL) (2.5 MG/3ML) 0.083% nebulizer solution Take 3 mLs (2.5 mg  total) by nebulization every 6 (six) hours as needed for wheezing or shortness of breath (J45.40). (Patient taking differently: Take 2.5 mg by nebulization every 6 (six) hours as needed for wheezing or shortness of breath. ) 75 mL 3  . albuterol (VENTOLIN HFA) 108 (90 Base) MCG/ACT inhaler Inhale 2 puffs into the lungs every 6 (six) hours as needed for wheezing or shortness of breath. 3 g 3  . aspirin EC 81 MG tablet Take 81 mg at bedtime by mouth.     Marland Kitchen atorvastatin (LIPITOR) 40 MG tablet TAKE 1 TABLET BY MOUTH EVERYDAY AT BEDTIME. **DUE FOR YEARLY PHYSICAL** 30 tablet 0  . Blood Glucose Monitoring Suppl (ONE TOUCH ULTRA 2) w/Device KIT USE TO CHECK BLOOD SUGAR 2 TIMES A DAY 1 kit 0  . budesonide-formoterol (SYMBICORT) 80-4.5 MCG/ACT inhaler INHALE 2 PUFFS INTO THE LUNGS EVERY MORNING  AND ANOTHER 2 PUFFS 12 HOURS LATER 30.6 Inhaler 1  . Calcium Carbonate-Vitamin D 600-200 MG-UNIT TABS Take 1 tablet by mouth daily.    Marland Kitchen CINNAMON PO Take 1,000 mg 2 (two) times daily by mouth.    . clobetasol cream (TEMOVATE) 0.05 % Apply topically 2 (two) times daily. (Patient taking differently: Apply topically daily as needed (rash). ) 30 g 0  . doxepin (SINEQUAN) 10 MG capsule Take 20 mg by mouth at bedtime.     Marland Kitchen esomeprazole (NEXIUM) 40 MG capsule Take 1 capsule (40 mg total) by mouth 2 (two) times daily. (Patient taking differently: Take 80 mg by mouth 2 (two) times daily. ) 180 capsule 3  . famotidine (PEPCID) 20 MG tablet TAKE 1 TABLET (20 MG TOTAL) BY MOUTH 2 (TWO) TIMES DAILY AS NEEDED FOR HEARTBURN OR INDIGESTION. 180 tablet 0  . fluticasone (FLONASE) 50 MCG/ACT nasal spray Place 2 sprays at bedtime as needed into both nostrils for allergies or rhinitis.     . furosemide (LASIX) 20 MG tablet TAKE 1-2 TABLETS BY MOUTH DAILY AS DIRECTED. 60 tablet 0  . glipiZIDE (GLUCOTROL XL) 5 MG 24 hr tablet TAKE 1 TABLET BY MOUTH EVERYDAY WITH BREAKFAST. **DUE FOR YEARLY PHYSICAL** 30 tablet 0  . L-Methylfolate (DEPLIN)  7.5 MG TABS Take 7.5 mg by mouth daily with breakfast.     . Lancets (ONETOUCH DELICA PLUS [id number]) MISC USE TO CHECK BLOOD SUGAR 2 TIMES A DAY 100 each 3  . LYRICA 150 MG capsule Take 150 mg by mouth 3 (three) times daily.   2  . metFORMIN (GLUCOPHAGE) 500 MG tablet TAKE 1 TABLET BY MOUTH TWICE A DAY WITH MEALS 60 tablet 0  . Misc Natural Products (GLUCOS-CHONDROIT-MSM COMPLEX PO) Take 1 tablet 3 (three) times daily by mouth.     . montelukast (SINGULAIR) 10 MG tablet TAKE 1 TABLET BY MOUTH EVERYDAY AT BEDTIME 90 tablet 1  . Multiple Vitamin (MULITIVITAMIN WITH MINERALS) TABS Take 1 tablet by mouth daily.      . mupirocin cream (BACTROBAN) 2 % Apply once daily to lesions on feet as needed. 30 g 1  . nitroGLYCERIN (NITROSTAT) 0.4 MG SL tablet Place 0.4 mg under the tongue every 5 (five) minutes as needed for chest pain.     Marland Kitchen ondansetron (ZOFRAN) 4 MG tablet Take 4 mg by mouth every 8 (eight) hours as needed for nausea or vomiting.    Glory Rosebush ULTRA test strip USE TO CHECK BLOOD SUGAR 2 TIMES A DAY 100 strip 1  . Oxycodone HCl 20 MG TABS Take 20 mg every 4 (four) hours as needed by mouth (pain).     . OXYGEN Inhale 2 L into the lungs at bedtime as needed (shortness of breath).     . potassium chloride (KLOR-CON) 10 MEQ tablet Take 2 tablets (20 mEq total) by mouth daily. 180 tablet 1  . promethazine (PHENERGAN) 25 MG tablet TAKE 1 TABLET (25 MG TOTAL) BY MOUTH EVERY 6 (SIX) HOURS AS NEEDED FOR NAUSEA OR VOMITING. 90 tablet 3  . sucralfate (CARAFATE) 1 g tablet Take 1 tablet (1 g total) by mouth 4 (four) times daily -  with meals and at bedtime. Before taking slowly dissolve tablet in 1 Tablespoon of distilled water for about 15 minutes to creat a slurry. (Patient taking differently: Take 1 g by mouth 4 (four) times daily. Before taking slowly dissolve tablet in 1 Tablespoon of distilled water for about 15 minutes to creat a slurry.)  360 tablet 3  . tiZANidine (ZANAFLEX) 4 MG tablet Take 4 mg by  mouth every 8 (eight) hours as needed for muscle spasms.    . vitamin B-12 (CYANOCOBALAMIN) 500 MCG tablet Take 500 mcg by mouth daily.      No facility-administered medications prior to visit.     Review of Systems:   Constitutional:   No  weight loss, night sweats,  Fevers, chills,  +fatigue, or  lassitude.  HEENT:   No headaches,  Difficulty swallowing,  Tooth/dental problems, or  Sore throat,                No sneezing, itching, ear ache, nasal congestion, post nasal drip,   CV:  No chest pain,  Orthopnea, PND, swelling in lower extremities, anasarca, dizziness, palpitations, syncope.   GI  No heartburn, indigestion, abdominal pain, nausea, vomiting, diarrhea, change in bowel habits, loss of appetite, bloody stools.   Resp: No shortness of breath with exertion or at rest.  No excess mucus, no productive cough,  No non-productive cough,  No coughing up of blood.  No change in color of mucus.  No wheezing.  No chest wall deformity  Skin: no rash or lesions.  GU: no dysuria, change in color of urine, no urgency or frequency.  No flank pain, no hematuria   MS:  No joint pain or swelling.  No decreased range of motion.  No back pain.    Physical Exam  BP 122/82 (BP Location: Right Arm, Cuff Size: Normal)   Pulse 83   Temp (!) 97.5 F (36.4 C) (Oral)   Ht '5\' 6"'  (1.676 m)   Wt 215 lb 9.6 oz (97.8 kg)   SpO2 94%   BMI 34.80 kg/m   GEN: A/Ox3; pleasant , NAD, well nourished    HEENT:  /AT,   NOSE-clear, THROAT-clear, no lesions, no postnasal drip or exudate noted.   NECK:  Supple w/ fair ROM; no JVD; normal carotid impulses w/o bruits; no thyromegaly or nodules palpated; no lymphadenopathy.    RESP  Clear  P & A; w/o, wheezes/ rales/ or rhonchi. no accessory muscle use, no dullness to percussion  CARD:  RRR, no m/r/g, no peripheral edema, pulses intact, no cyanosis or clubbing.  GI:   Soft & nt; nml bowel sounds; no organomegaly or masses detected.   Musco: Warm bil,  no deformities or joint swelling noted.   Neuro: alert, no focal deficits noted.    Skin: Warm, no lesions or rashes    Lab Results:  CBC   Imaging: No results found.    No flowsheet data found.       Assessment & Plan:   OSA (obstructive sleep apnea) Excellent control and compliance on nocturnal CPAP  Plan  Patient Instructions  Continue on Symbicort 2 puffs Twice daily  , rinse after use.  Continue on Singulair daily  Albuterol As needed   Low salt diet  Continue on CPAP At bedtime   Work on healthy diet and weight loss.  Follow up with Dr. Elsworth Soho  In 3-4  months and As needed   Please contact office for sooner follow up if symptoms do not improve or worsen or seek emergency care         Asthma Good control on current regimen  Plan  Patient Instructions  Continue on Symbicort 2 puffs Twice daily  , rinse after use.  Continue on Singulair daily  Albuterol As needed   Low salt diet  Continue on CPAP At bedtime   Work on healthy diet and weight loss.  Follow up with Dr. Elsworth Soho  In 3-4  months and As needed   Please contact office for sooner follow up if symptoms do not improve or worsen or seek emergency care            Rexene Edison, NP 07/29/2020

## 2020-07-29 NOTE — Assessment & Plan Note (Signed)
Good control on current regimen  Plan  Patient Instructions  Continue on Symbicort 2 puffs Twice daily  , rinse after use.  Continue on Singulair daily  Albuterol As needed   Low salt diet  Continue on CPAP At bedtime   Work on healthy diet and weight loss.  Follow up with Dr. Elsworth Soho  In 3-4  months and As needed   Please contact office for sooner follow up if symptoms do not improve or worsen or seek emergency care

## 2020-07-29 NOTE — Assessment & Plan Note (Signed)
Excellent control and compliance on nocturnal CPAP  Plan  Patient Instructions  Continue on Symbicort 2 puffs Twice daily  , rinse after use.  Continue on Singulair daily  Albuterol As needed   Low salt diet  Continue on CPAP At bedtime   Work on healthy diet and weight loss.  Follow up with Dr. Elsworth Soho  In 3-4  months and As needed   Please contact office for sooner follow up if symptoms do not improve or worsen or seek emergency care

## 2020-07-29 NOTE — Patient Instructions (Signed)
Continue on Symbicort 2 puffs Twice daily  , rinse after use.  Continue on Singulair daily  Albuterol As needed   Low salt diet  Continue on CPAP At bedtime   Work on healthy diet and weight loss.  Follow up with Dr. Elsworth Soho  In 3-4  months and As needed   Please contact office for sooner follow up if symptoms do not improve or worsen or seek emergency care

## 2020-08-03 ENCOUNTER — Other Ambulatory Visit: Payer: Self-pay | Admitting: Internal Medicine

## 2020-08-05 ENCOUNTER — Other Ambulatory Visit: Payer: Self-pay | Admitting: Family Medicine

## 2020-08-06 ENCOUNTER — Other Ambulatory Visit: Payer: Self-pay | Admitting: Family Medicine

## 2020-08-17 ENCOUNTER — Other Ambulatory Visit: Payer: Self-pay | Admitting: Adult Health

## 2020-08-26 ENCOUNTER — Other Ambulatory Visit: Payer: Self-pay | Admitting: Family Medicine

## 2020-09-01 DIAGNOSIS — M47816 Spondylosis without myelopathy or radiculopathy, lumbar region: Secondary | ICD-10-CM | POA: Diagnosis not present

## 2020-09-01 DIAGNOSIS — G893 Neoplasm related pain (acute) (chronic): Secondary | ICD-10-CM | POA: Diagnosis not present

## 2020-09-01 DIAGNOSIS — Z79891 Long term (current) use of opiate analgesic: Secondary | ICD-10-CM | POA: Diagnosis not present

## 2020-09-01 DIAGNOSIS — G894 Chronic pain syndrome: Secondary | ICD-10-CM | POA: Diagnosis not present

## 2020-09-02 ENCOUNTER — Other Ambulatory Visit: Payer: Self-pay

## 2020-09-02 ENCOUNTER — Ambulatory Visit (INDEPENDENT_AMBULATORY_CARE_PROVIDER_SITE_OTHER): Payer: Medicare Other

## 2020-09-02 ENCOUNTER — Encounter (HOSPITAL_COMMUNITY): Payer: Self-pay

## 2020-09-02 ENCOUNTER — Ambulatory Visit (HOSPITAL_COMMUNITY)
Admission: EM | Admit: 2020-09-02 | Discharge: 2020-09-02 | Disposition: A | Discharge status: Home / Self Care | Payer: Medicare Other | Attending: Family Medicine | Admitting: Family Medicine

## 2020-09-02 DIAGNOSIS — S93491A Sprain of other ligament of right ankle, initial encounter: Secondary | ICD-10-CM | POA: Diagnosis not present

## 2020-09-02 DIAGNOSIS — M25571 Pain in right ankle and joints of right foot: Secondary | ICD-10-CM | POA: Diagnosis not present

## 2020-09-02 NOTE — ED Triage Notes (Signed)
Pt presenting with right ankle pain after she fell 4 days ago while walking outside to feed her chickens. States her knee "gave out" and she just fell on the lateral aspect of her foot. Experienced nausea with extreme pain.  Denies any head injury, n/v, numbness, tingling, Limited ROM and weight bearing to extremity  with edema, +2 DP pulses. Patient took advil this morning with some relief

## 2020-09-02 NOTE — ED Provider Notes (Addendum)
[place]    CSN: [id number] Arrival date & time: 09/02/20  3474      History   Chief Complaint Chief Complaint  Patient presents with  . Ankle Injury  . Fall    HPI Shelby Anderson is a 64 y.o. female.   HPI  Patient states that she has "weak ankles".  She states that th they have caused her to fall on multiple occasions.  She states that couple of days ago she fell while she was outdoors walking on uneven ground.  Inverted her right ankle.'s been painful and swollen ever since then.  She has been limiting her walking.  Using ice.  Trying to elevate.  She takes over-the-counter Advil or Aleve for pain Patient has diabetes and multiple medical problems.  She does have a primary care doctor.  She is scheduled to see them for a physical soon   Past Medical History:  Diagnosis Date  . Allergy   . Anemia    "chronic"  . Angina    Prinzmetal angina  . Anxiety and depression   . Arthritis   . Asthma   . Atrial fibrillation ([place])    h/o "AF w/frequent PVCs"  . Breast cancer ([place])    Left  . Cancer (HCC)    hx of skin cancer   . CHF (congestive heart failure) ([place])   . Chronic back pain greater than 3 months duration    on chronic narcotics, treated at pain clinic  . Colon polyps    hyperplastic  . Coronary artery disease    Arrythmia, orthostatic hypotension, HLD, HTN; sees Dr. Einar Gip  . Difficult intubation    "TMJ & woke up when they were still cutting on me"  . Dysrhythmia    sees Dr. Einar Gip and a cardiologist at [id number]  . Esophageal stricture   . Family history of melanoma ]  . Family history of pancreatic cancer   . Fatty liver   . Fatty liver   . Fibroids   . Fibromyalgia    "in my legs"  . GERD (gastroesophageal reflux disease)    hx hiatal hernia, stricture and gastric ulcer  . Headache(784.0)    migraines  . Heart murmur   . Hiatal hernia   . History of loop recorder   . History of migraines    "dx'd when I was in my teens"   . Hyperlipemia   . Hypertension   . Mental disorder   . Mild episode of recurrent major depressive disorder ([place]) 12/06/2015  . Myocardial infarction [facility]) 1980's & 1990;   sees Dr. Einar Gip  . OSA (obstructive sleep apnea)   . Pneumonia    multiple times  . PONV (postoperative nausea and vomiting)   . Recurrent upper respiratory infection (URI)   . Shortness of breath 11/20/11   "all the time", sees pulmonlogy, ? asthma  . Stenotic cervical os   . Stomach ulcer    "3 small; found in 05/2011"  . TMJ (dislocation of temporomandibular joint)   . Tuberculosis    + TB SKIN TEST  . Type 2 diabetes mellitus without complication, without long-term current use of insulin ([place]) 12/06/2015   not on meds     Patient Active Problem List   Diagnosis Date Noted  . Acute on chronic diastolic CHF (congestive heart failure) ([place]) 10/01/2019  . Neutropenia with fever ([place]) 10/01/2019  . Genetic testing 09/17/2019  . Family history of melanoma   . Family history  of pancreatic cancer   . Malignant neoplasm of upper-outer quadrant of left breast in female, estrogen receptor negative ([place]) 09/07/2019  . Encounter for loop recorder at end of battery life 04/24/2018  . Esophageal stricture 07/01/2017  . Hyperlipidemia associated with type 2 diabetes mellitus ([place]) 05/20/2017  . Allergic rhinitis 03/11/2017  . Dilated cardiomyopathy ([place]) 02/07/2017  . 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 Morbid obesity due to excess calories ([place]) 09/26/2016  . S/P left TKA 09/25/2016  . Increased endometrial stripe thickness 06/27/2016  . Intramural leiomyoma of uterus 06/27/2016  . Type 2 diabetes mellitus without complication, without long-term current use of insulin ([place]) 12/06/2015  . Chronic respiratory failure ([place]) 09/15/2015  . Hypertension associated with diabetes ([place]) 07/19/2015  . Arrhythmia 07/19/2015  . Orthostatic hypotension 07/19/2015  . GERD (gastroesophageal reflux disease)  07/19/2015  . Chronic back pain 07/19/2015  . Neuropathy 07/19/2015  . Symptomatic PVCs 11/02/2014  . Syncope 11/02/2014  . Chest pain 12/23/2013  . Disorder of cervix 03/10/2013  . Vaginal atrophy 03/10/2013  . OSA (obstructive sleep apnea) 07/31/2012  . Asthma 04/29/2012  . Coronary artery disease 11/20/2011    Past Surgical History:  Procedure Laterality Date  . ACHILLES TENDON REPAIR  1970's   left ankle  . ARTHROSCOPIC REPAIR ACL     left knee cap  . BREAST BIOPSY Right 04/08/2013   again in October or November 2020  . BREAST LUMPECTOMY WITH RADIOACTIVE SEED AND SENTINEL LYMPH NODE BIOPSY Left 12/09/2019   Procedure: LEFT BREAST LUMPECTOMY WITH RADIOACTIVE SEED AND LEFT AXILLARY SENTINEL LYMPH NODE BIOPSY;  Surgeon: Rolm Bookbinder, MD;  Location: [place];  Service: General;  Laterality: Left;  PEC BLOCK  . CARDIAC CATHETERIZATION     loop recorder  . CARPAL TUNNEL RELEASE  unknown   left hand  . COLONOSCOPY    . ESOPHAGOGASTRODUODENOSCOPY (EGD) WITH PROPOFOL N/A 10/29/2017   Procedure: ESOPHAGOGASTRODUODENOSCOPY (EGD) WITH PROPOFOL;  Surgeon: Irene Shipper, MD;  Location: [facility];  Service: Endoscopy;  Laterality: N/A;  . LOOP RECORDER IMPLANT    . PORT-A-CATH REMOVAL N/A 12/09/2019   Procedure: REMOVAL PORT-A-CATH;  Surgeon: Rolm Bookbinder, MD;  Location: [place];  Service: General;  Laterality: N/A;  . PORTACATH PLACEMENT N/A 09/23/2019   Procedure: INSERTION PORT-A-CATH Right Internal Doreen Salvage;  Surgeon: Rolm Bookbinder, MD;  Location: [place];  Service: General;  Laterality: N/A;  . post ganglionectomy  1970's   "for migraine headaches"  . pouch string  [phone]   "did this 3 times (once w/each pregnancy)"  . SAVORY DILATION N/A 10/29/2017   Procedure: SAVORY DILATION;  Surgeon: Irene Shipper, MD;  Location: [facility];  Service: Endoscopy;  Laterality: N/A;  . TOTAL KNEE ARTHROPLASTY Left 09/25/2016   Procedure: LEFT TOTAL KNEE ARTHROPLASTY;   Surgeon: Paralee Cancel, MD;  Location: [facility];  Service: Orthopedics;  Laterality: Left;  . TUBAL LIGATION  1980's    OB History    Gravida  5   Para  3   Term      Preterm      AB  2   Living  3     SAB  2   TAB      Ectopic      Multiple      Live Births               Home Medications    Prior to Admission medications   Medication Sig Start Date  End Date Taking? Authorizing Provider  albuterol (VENTOLIN HFA) 108 (90 Base) MCG/ACT inhaler Inhale 2 puffs into the lungs every 6 (six) hours as needed for wheezing or shortness of breath. 02/19/20  Yes Parrett, Tammy S, NP  aspirin EC 81 MG tablet Take 81 mg at bedtime by mouth.    Yes [provider]  atorvastatin (LIPITOR) 40 MG tablet TAKE 1 TABLET BY MOUTH EVERYDAY AT BEDTIME. **DUE FOR YEARLY PHYSICAL** 08/05/20  Yes Koberlein, Junell C, MD  budesonide-formoterol (SYMBICORT) 80-4.5 MCG/ACT inhaler INHALE 2 PUFFS INTO THE LUNGS EVERY MORNING AND ANOTHER 2 PUFFS 12 HOURS LATER 02/19/20  Yes Parrett, Tammy S, NP  doxepin (SINEQUAN) 10 MG capsule Take 20 mg by mouth at bedtime.  03/27/19  Yes [provider]  esomeprazole (NEXIUM) 40 MG capsule Take 1 capsule (40 mg total) by mouth 2 (two) times daily. Patient taking differently: Take 80 mg by mouth 2 (two) times daily.  08/19/19  Yes Irene Shipper, MD  famotidine (PEPCID) 20 MG tablet TAKE 1 TABLET (20 MG TOTAL) BY MOUTH 2 (TWO) TIMES DAILY AS NEEDED FOR HEARTBURN OR INDIGESTION. 07/12/20 07/12/21 Yes Irene Shipper, MD  fluticasone [facility]) 50 MCG/ACT nasal spray Place 2 sprays at bedtime as needed into both nostrils for allergies or rhinitis.    Yes [provider]  furosemide (LASIX) 20 MG tablet TAKE 1-2 TABLETS BY MOUTH DAILY AS DIRECTED. 08/26/20  Yes Koberlein, Junell C, MD  glipiZIDE (GLUCOTROL XL) 5 MG 24 hr tablet TAKE 1 TABLET BY MOUTH EVERYDAY WITH BREAKFAST. **DUE FOR YEARLY PHYSICAL** 07/14/20  Yes Koberlein, Junell C, MD  L-Methylfolate  (DEPLIN) 7.5 MG TABS Take 7.5 mg by mouth daily with breakfast.    Yes [provider]  LYRICA 150 MG capsule Take 150 mg by mouth 3 (three) times daily.  05/28/18  Yes [provider]  metFORMIN (GLUCOPHAGE) 500 MG tablet TAKE 1 TABLET BY MOUTH TWICE A DAY WITH MEALS 07/27/20  Yes Koberlein, Junell C, MD  montelukast (SINGULAIR) 10 MG tablet TAKE 1 TABLET BY MOUTH EVERYDAY AT BEDTIME 08/17/20  Yes Parrett, Tammy S, NP  Multiple Vitamin (MULITIVITAMIN WITH MINERALS) TABS Take 1 tablet by mouth daily.     Yes [provider]  potassium chloride (KLOR-CON) 10 MEQ tablet Take 2 tablets (20 mEq total) by mouth daily. 01/13/20  Yes Koberlein, Junell C, MD  tiZANidine (ZANAFLEX) 4 MG tablet Take 4 mg by mouth every 8 (eight) hours as needed for muscle spasms.   Yes [provider]  albuterol (PROVENTIL) (2.5 MG/3ML) 0.083% nebulizer solution Take 3 mLs (2.5 mg total) by nebulization every 6 (six) hours as needed for wheezing or shortness of breath (J45.40). Patient taking differently: Take 2.5 mg by nebulization every 6 (six) hours as needed for wheezing or shortness of breath.  09/19/16   Javier Glazier, MD  Blood Glucose Monitoring Suppl (ONE TOUCH ULTRA 2) w/Device KIT USE TO CHECK BLOOD SUGAR 2 TIMES A DAY 11/30/19   Koberlein, Steele Berg, MD  Calcium Carbonate-Vitamin D 600-200 MG-UNIT TABS Take 1 tablet by mouth daily. 01/01/11   [provider]  CINNAMON PO Take 1,000 mg 2 (two) times daily by mouth.    [provider]  clobetasol cream (TEMOVATE) 0.05 % Apply topically 2 (two) times daily. Patient taking differently: Apply topically daily as needed (rash).  10/04/19   Georgette Shell, MD  Lancets [facility] DELICA PLUS [id number]) MISC USE TO CHECK BLOOD SUGAR 2 TIMES A  DAY 12/02/19   Koberlein, Steele Berg, MD  Misc Natural Products (GLUCOS-CHONDROIT-MSM COMPLEX PO) Take 1 tablet 3 (three) times daily by mouth.     [provider]   mupirocin cream (BACTROBAN) 2 % Apply once daily to lesions on feet as needed. 12/25/19   Marzetta Board, DPM  nitroGLYCERIN (NITROSTAT) 0.4 MG SL tablet Place 0.4 mg under the tongue every 5 (five) minutes as needed for chest pain.     [provider]  ondansetron (ZOFRAN) 4 MG tablet Take 4 mg by mouth every 8 (eight) hours as needed for nausea or vomiting.    [provider]  ONETOUCH ULTRA test strip USE TO CHECK BLOOD SUGAR 2 TIMES A DAY 08/09/20   Koberlein, Andris Flurry C, MD  Oxycodone HCl 20 MG TABS Take 20 mg every 4 (four) hours as needed by mouth (pain).  08/09/17   [provider]  OXYGEN Inhale 2 L into the lungs at bedtime as needed (shortness of breath).     [provider]  promethazine (PHENERGAN) 25 MG tablet TAKE 1 TABLET (25 MG TOTAL) BY MOUTH EVERY 6 (SIX) HOURS AS NEEDED FOR NAUSEA OR VOMITING. 02/19/20   Irene Shipper, MD  sucralfate (CARAFATE) 1 g tablet DISSOLVE 1 TAB IN 1 TABLESPOON OF DISTILLED WATER FOR 15 MINS. TAKE 4XDAILY WITH MEALS & AT BEDTIME 08/03/20   Irene Shipper, MD  vitamin B-12 (CYANOCOBALAMIN) 500 MCG tablet Take 500 mcg by mouth daily.     [provider]    Family History Family History  Problem Relation Age of Onset  . Malignant hyperthermia Father   . Hypertension Father   . Heart disease Father   . Diabetes Father   . Cancer Father        skin  . Hypertension Mother   . Heart disease Mother   . Multiple myeloma Mother   . Cancer Sister        CERVICAL  . Hypertension Sister   . Cancer Brother 22       MELANOMA  . Heart disease Maternal Grandmother   . Other Maternal Grandmother 32       complications of childbirth  . Heart disease Maternal Grandfather   . Cancer Paternal Grandmother        ?   Marland Kitchen Heart disease Paternal Grandmother   . Heart disease Paternal Grandfather   . Cancer Brother        LUNG  . Diabetes Sister   . Hypertension Sister   . Heart disease Sister   . Cancer Sister   .  Cancer Brother   . Pancreatic cancer Niece 7  . Cancer Nephew 40       unknown- currently in the [employer]  . Anesthesia problems Neg Hx   . Hypotension Neg Hx   . Pseudochol deficiency Neg Hx   . Colon cancer Neg Hx   . Esophageal cancer Neg Hx   . Rectal cancer Neg Hx   . Stomach cancer Neg Hx   . Breast cancer Neg Hx     Social History Social History   Tobacco Use  . Smoking status: Never Smoker  . Smokeless tobacco: Never Used  Vaping Use  . Vaping Use: Never used  Substance Use Topics  . Alcohol use: No    Alcohol/week: 0.0 standard drinks  . Drug use: No     Allergies   Lodine [etodolac], Oxycontin [oxycodone hcl], Penicillins, Aspirin, Darvocet [propoxyphene n-acetaminophen], Nitroglycerin, Tramadol, Ultram [  tramadol hcl], and Valium   Review of Systems Review of Systems  See HPI Physical Exam   No data found.  Updated Vital Signs BP 131/67 (BP Location: Right Arm)   Pulse 60   Temp 98.4 F (36.9 C) (Oral)   Resp 17   SpO2 98%      Physical Exam Constitutional:      General: She is not in acute distress.    Appearance: She is well-developed.  HENT:     Head: Normocephalic and atraumatic.  Eyes:     Conjunctiva/sclera: Conjunctivae normal.     Pupils: Pupils are equal, round, and reactive to light.  Cardiovascular:     Rate and Rhythm: Normal rate.  Pulmonary:     Effort: Pulmonary effort is normal. No respiratory distress.  Abdominal:     Palpations: Abdomen is soft.  Musculoskeletal:        General: Normal range of motion.     Cervical back: Normal range of motion.     Comments: swelling and tenderness over the lateral ankle and lat malleolus.  No instability  Skin:    General: Skin is warm and dry.  Neurological:     Mental Status: She is alert.      UC Treatments / Results  Labs (all labs ordered are listed, but only abnormal results are displayed) Labs Reviewed - No data to display  EKG   Radiology DG Ankle Complete  Right  Result Date: 09/02/2020 CLINICAL DATA:  Fall, right ankle pain EXAM: RIGHT ANKLE - COMPLETE 3+ VIEW COMPARISON:  None. FINDINGS: No fracture or dislocation is seen. The ankle mortise is intact. Well corticated fragments along the medial malleolus, possibly sequela of prior trauma. The base of the fifth metatarsal is unremarkable. The visualized soft tissues are unremarkable. Small plantar and posterior calcaneal enthesophytes. IMPRESSION: Negative. Electronically Signed   By: Julian Hy M.D.   On: 09/02/2020 10:07    Procedures Procedures (including critical care time)  Medications Ordered in UC Medications - No data to display  Initial Impression / Assessment and Plan / UC Course  I have reviewed the triage vital signs and the nursing notes.  Pertinent labs & imaging results that were available during my care of the patient were reviewed by me and considered in my medical decision making (see chart for details).     Patient is retired [employer] scientist.  Recovery expectations discussed Final Clinical Impressions(s) / UC Diagnoses   Final diagnoses:  Sprain of anterior talofibular ligament of right ankle, initial encounter     Discharge Instructions     Wear brace until you can walk comfortably Ice or heat to area Tylenol or NSAID ( ibuprofen or naproxen) for pain See your doctor if pain persists    ED Prescriptions    None     PDMP not reviewed this encounter.   Raylene Everts, MD 09/02/20 1057    Raylene Everts, MD 09/02/20 1106

## 2020-09-02 NOTE — Discharge Instructions (Addendum)
Wear brace until you can walk comfortably Ice or heat to area Tylenol or NSAID ( ibuprofen or naproxen) for pain See your doctor if pain persists

## 2020-09-04 ENCOUNTER — Other Ambulatory Visit: Payer: Self-pay | Admitting: Family Medicine

## 2020-09-12 DIAGNOSIS — Z20822 Contact with and (suspected) exposure to covid-19: Secondary | ICD-10-CM | POA: Diagnosis not present

## 2020-09-15 ENCOUNTER — Other Ambulatory Visit: Payer: Self-pay | Admitting: Family Medicine

## 2020-09-19 DIAGNOSIS — G4733 Obstructive sleep apnea (adult) (pediatric): Secondary | ICD-10-CM | POA: Diagnosis not present

## 2020-09-22 ENCOUNTER — Other Ambulatory Visit: Payer: Self-pay | Admitting: Family Medicine

## 2020-09-24 ENCOUNTER — Other Ambulatory Visit: Payer: Self-pay | Admitting: Family Medicine

## 2020-09-30 ENCOUNTER — Ambulatory Visit (INDEPENDENT_AMBULATORY_CARE_PROVIDER_SITE_OTHER): Payer: Medicare Other | Admitting: Family Medicine

## 2020-09-30 ENCOUNTER — Encounter: Payer: Self-pay | Admitting: Family Medicine

## 2020-09-30 ENCOUNTER — Other Ambulatory Visit: Payer: Self-pay

## 2020-09-30 ENCOUNTER — Ambulatory Visit (INDEPENDENT_AMBULATORY_CARE_PROVIDER_SITE_OTHER): Payer: Medicare Other

## 2020-09-30 VITALS — BP 128/80 | HR 85 | Temp 98.2°F | Ht 66.0 in | Wt 218.8 lb

## 2020-09-30 DIAGNOSIS — I152 Hypertension secondary to endocrine disorders: Secondary | ICD-10-CM

## 2020-09-30 DIAGNOSIS — E1159 Type 2 diabetes mellitus with other circulatory complications: Secondary | ICD-10-CM

## 2020-09-30 DIAGNOSIS — E1169 Type 2 diabetes mellitus with other specified complication: Secondary | ICD-10-CM

## 2020-09-30 DIAGNOSIS — G629 Polyneuropathy, unspecified: Secondary | ICD-10-CM

## 2020-09-30 DIAGNOSIS — E538 Deficiency of other specified B group vitamins: Secondary | ICD-10-CM | POA: Diagnosis not present

## 2020-09-30 DIAGNOSIS — K219 Gastro-esophageal reflux disease without esophagitis: Secondary | ICD-10-CM | POA: Diagnosis not present

## 2020-09-30 DIAGNOSIS — R7989 Other specified abnormal findings of blood chemistry: Secondary | ICD-10-CM

## 2020-09-30 DIAGNOSIS — M25571 Pain in right ankle and joints of right foot: Secondary | ICD-10-CM | POA: Diagnosis not present

## 2020-09-30 DIAGNOSIS — Z171 Estrogen receptor negative status [ER-]: Secondary | ICD-10-CM

## 2020-09-30 DIAGNOSIS — J4530 Mild persistent asthma, uncomplicated: Secondary | ICD-10-CM

## 2020-09-30 DIAGNOSIS — E559 Vitamin D deficiency, unspecified: Secondary | ICD-10-CM

## 2020-09-30 DIAGNOSIS — E119 Type 2 diabetes mellitus without complications: Secondary | ICD-10-CM | POA: Diagnosis not present

## 2020-09-30 DIAGNOSIS — R252 Cramp and spasm: Secondary | ICD-10-CM

## 2020-09-30 DIAGNOSIS — C50412 Malignant neoplasm of upper-outer quadrant of left female breast: Secondary | ICD-10-CM

## 2020-09-30 DIAGNOSIS — E785 Hyperlipidemia, unspecified: Secondary | ICD-10-CM

## 2020-09-30 MED ORDER — SPACER/AERO-HOLDING CHAMBERS DEVI: 1.0000 | Freq: Every day | 0 refills | Status: DC | PRN

## 2020-09-30 NOTE — Progress Notes (Signed)
Shelby Anderson DOB: 1956-01-28 Encounter date: 09/30/2020  This is a 64 y.o. female who presents with Chief Complaint  Patient presents with  . Follow-up    History of present illness: Went to urgent care and told that she had fracture but "nobody wants to do anything about it". Brace put on her cuts into her leg. Making everything above that area swells, hurts. Moving foot at all hurts and feels like foot is on fire and swells any time she is on it. Has to elevate it for a whole day before being able to walk again. Using ice which helps; props it up. But if foot moves, then she has pain/feels like foot is on fire and pain radiates to knee. Feels like it hurts now more than it did then. Now it swells more and burns more. Inversion injury. Just got worse with walking on it. Didn't get significant bruising initially. Can't even drive with right foot - hurts to push on toes. She can stand on right foot.   Blood sugars - go from 79-140's. Staying in this area. Glipizide 38m, metformin 5017mBID,   She has been on steroids 1.5 times since she was here last time.   Breathing is doing ok at times. Has sore throat with inhalers. Not using recue inhaler as much and has been better since pollen has decreased. Sats around 94 at recent pulm visit. Using symbicort.   Has some nodules that have come up on left breast; surgeon thinks it is radiation changes - she has upcoming mammogram.   HL: still on lipitor - doing well with 407mGetting cramping in left leg; but has been told this was fibromyalgia.   Acid reflux: pepcid, nexium.    Allergies  Allergen Reactions  . Lodine [Etodolac] Anaphylaxis, Hives and Swelling  . Oxycontin [Oxycodone Hcl] Anaphylaxis    hives, trouble breathing, tongue swelling (Only Oxycontin) Tolerates plain oxycodone.  . PMarland Kitchennicillins Anaphylaxis    Told by a surgeon never to take it again. Has patient had a PCN reaction causing immediate rash, facial/tongue/throat  swelling, SOB or lightheadedness with hypotension: Yes Has patient had a PCN reaction causing severe rash involving mucus membranes or skin necrosis: Unknown Has patient had a PCN reaction that required hospitalization: No Has patient had a PCN reaction occurring within the last 10 years: No If all of the above answers are "NO", then may proceed with Cephalosporin use.  . Aspirin Other (See Comments)    High-dose caused GI Bleeds  . Darvocet [Propoxyphene N-Acetaminophen] Hives  . Nitroglycerin Other (See Comments)    IV-BP drops dramatically Can take po  . Tramadol Hives and Itching  . Ultram [Tramadol Hcl] Hives  . Valium Other (See Comments)    Circulation problems. "Legs turned black".   Current Meds  Medication Sig  . albuterol (PROVENTIL) (2.5 MG/3ML) 0.083% nebulizer solution Take 3 mLs (2.5 mg total) by nebulization every 6 (six) hours as needed for wheezing or shortness of breath (J45.40). (Patient taking differently: Take 2.5 mg by nebulization every 6 (six) hours as needed for wheezing or shortness of breath. )  . albuterol (VENTOLIN HFA) 108 (90 Base) MCG/ACT inhaler Inhale 2 puffs into the lungs every 6 (six) hours as needed for wheezing or shortness of breath.  . aMarland Kitchenpirin EC 81 MG tablet Take 81 mg at bedtime by mouth.   . aMarland Kitchenorvastatin (LIPITOR) 40 MG tablet TAKE 1 TABLET BY MOUTH EVERYDAY AT BEDTIME. **DUE FOR YEARLY PHYSICAL**  . Blood Glucose  Monitoring Suppl (ONE TOUCH ULTRA 2) w/Device KIT USE TO CHECK BLOOD SUGAR 2 TIMES A DAY  . budesonide-formoterol (SYMBICORT) 80-4.5 MCG/ACT inhaler INHALE 2 PUFFS INTO THE LUNGS EVERY MORNING AND ANOTHER 2 PUFFS 12 HOURS LATER  . Calcium Carbonate-Vitamin D 600-200 MG-UNIT TABS Take 1 tablet by mouth daily.  Marland Kitchen CINNAMON PO Take 1,000 mg 2 (two) times daily by mouth.  . clobetasol cream (TEMOVATE) 0.05 % Apply topically 2 (two) times daily. (Patient taking differently: Apply topically daily as needed (rash). )  . doxepin (SINEQUAN) 10  MG capsule Take 20 mg by mouth at bedtime.   Marland Kitchen esomeprazole (NEXIUM) 40 MG capsule Take 1 capsule (40 mg total) by mouth 2 (two) times daily. (Patient taking differently: Take 80 mg by mouth 2 (two) times daily. )  . famotidine (PEPCID) 20 MG tablet TAKE 1 TABLET (20 MG TOTAL) BY MOUTH 2 (TWO) TIMES DAILY AS NEEDED FOR HEARTBURN OR INDIGESTION.  . fluticasone (FLONASE) 50 MCG/ACT nasal spray Place 2 sprays at bedtime as needed into both nostrils for allergies or rhinitis.   . furosemide (LASIX) 20 MG tablet TAKE 1 TO 2 TABLETS BY MOUTH EVERY DAY AS DIRECTED  . glipiZIDE (GLUCOTROL XL) 5 MG 24 hr tablet TAKE 1 TABLET BY MOUTH EVERYDAY WITH BREAKFAST. **DUE FOR YEARLY PHYSICAL**  . L-Methylfolate (DEPLIN) 7.5 MG TABS Take 7.5 mg by mouth daily with breakfast.   . Lancets (ONETOUCH DELICA PLUS [id number]) MISC USE TO CHECK BLOOD SUGAR 2 TIMES A DAY  . LYRICA 150 MG capsule Take 150 mg by mouth 3 (three) times daily.   . metFORMIN (GLUCOPHAGE) 500 MG tablet TAKE 1 TABLET BY MOUTH TWICE A DAY WITH MEALS  . Misc Natural Products (GLUCOS-CHONDROIT-MSM COMPLEX PO) Take 1 tablet 3 (three) times daily by mouth.   . montelukast (SINGULAIR) 10 MG tablet TAKE 1 TABLET BY MOUTH EVERYDAY AT BEDTIME  . Multiple Vitamin (MULITIVITAMIN WITH MINERALS) TABS Take 1 tablet by mouth daily.    . mupirocin cream (BACTROBAN) 2 % Apply once daily to lesions on feet as needed.  . nitroGLYCERIN (NITROSTAT) 0.4 MG SL tablet Place 0.4 mg under the tongue every 5 (five) minutes as needed for chest pain.   Marland Kitchen ondansetron (ZOFRAN) 4 MG tablet Take 4 mg by mouth every 8 (eight) hours as needed for nausea or vomiting.  Glory Rosebush ULTRA test strip USE TO CHECK BLOOD SUGAR 2 TIMES A DAY  . Oxycodone HCl 20 MG TABS Take 20 mg every 4 (four) hours as needed by mouth (pain).   . OXYGEN Inhale 2 L into the lungs at bedtime as needed (shortness of breath).   . potassium chloride (KLOR-CON) 10 MEQ tablet TAKE 2 TABLETS BY MOUTH DAILY  .  promethazine (PHENERGAN) 25 MG tablet TAKE 1 TABLET (25 MG TOTAL) BY MOUTH EVERY 6 (SIX) HOURS AS NEEDED FOR NAUSEA OR VOMITING.  . sucralfate (CARAFATE) 1 g tablet DISSOLVE 1 TAB IN 1 TABLESPOON OF DISTILLED WATER FOR 15 MINS. TAKE 4XDAILY WITH MEALS & AT BEDTIME  . tiZANidine (ZANAFLEX) 4 MG tablet Take 4 mg by mouth every 8 (eight) hours as needed for muscle spasms.  . vitamin B-12 (CYANOCOBALAMIN) 500 MCG tablet Take 500 mcg by mouth daily.     Review of Systems  Constitutional: Positive for fatigue. Negative for chills and fever.  Respiratory: Negative for cough, chest tightness, shortness of breath and wheezing.   Cardiovascular: Negative for chest pain, palpitations and leg swelling.  Musculoskeletal: Positive for arthralgias  and gait problem.    Objective:  BP 128/80 (BP Location: Right Arm, Patient Position: Sitting, Cuff Size: Large)   Pulse 85   Temp 98.2 F (36.8 C) (Oral)   Ht '5\' 6"'  (1.676 m)   Wt 218 lb 12.8 oz (99.2 kg)   BMI 35.32 kg/m   Weight: 218 lb 12.8 oz (99.2 kg)   BP Readings from Last 3 Encounters:  09/30/20 128/80  09/02/20 131/67  07/29/20 122/82   Wt Readings from Last 3 Encounters:  09/30/20 218 lb 12.8 oz (99.2 kg)  07/29/20 215 lb 9.6 oz (97.8 kg)  05/18/20 221 lb 11.2 oz (100.6 kg)    Physical Exam Constitutional:      General: She is not in acute distress.    Appearance: She is well-developed.  Cardiovascular:     Rate and Rhythm: Normal rate and regular rhythm.     Heart sounds: Normal heart sounds. No murmur heard.  No friction rub.  Pulmonary:     Effort: Pulmonary effort is normal. No respiratory distress.     Breath sounds: Normal breath sounds. No wheezing or rales.  Musculoskeletal:     Right lower leg: No edema.     Left lower leg: No edema.     Comments: She has significant tenderness with palpation of the lateral malleolus.  No active bruising.  Mild edema of the right ankle.  Of note, she has a picture that she shows me of a  recurrent rash she gets on her lower legs, and in this picture she does have some significant bruising around the lateral malleolus which was status post injury.  Skin:    Comments: Normal monofilament exam to feet bilaterally.  Neurological:     Mental Status: She is alert and oriented to person, place, and time.  Psychiatric:        Behavior: Behavior normal.     Assessment/Plan  1. Right ankle pain, unspecified chronicity Concerned that pain seems to be worsening with her ankle.  We will re-xray today for comparison and determine what follow-up as needed.  Current ankle brace is not fitting her well, so she may need a more stable brace. - DG Ankle Complete Right; Future  2. Mild persistent asthma without complication Breathing is doing better although she does get some soreness in the back of the throat.  I have sent in a spacing chamber for her to use, which I am hoping will help with medication delivery. - Spacer/Aero-Holding Chambers DEVI; 1 Device by Does not apply route daily as needed.  Dispense: 1 Device; Refill: 0  3. Hypertension associated with diabetes ([place]) Blood pressure stable today. - CBC with Differential/Platelet; Future - Comprehensive metabolic panel; Future - Comprehensive metabolic panel - CBC with Differential/Platelet  4. Gastroesophageal reflux disease without esophagitis 40 mg twice daily, Pepcid 20 mg twice daily, Carafate as directed from GI.  She does still use Zofran if needed.  5. Type 2 diabetes mellitus without complication, without long-term current use of insulin (HCC) Continue with glipizide 5 mg daily, continue Metformin 500 mg twice daily. - Hemoglobin A1c; Future - HM DIABETES FOOT EXAM - Hemoglobin A1c  6. Hyperlipidemia associated with type 2 diabetes mellitus (HCC) Continue Lipitor 40 mg daily.  We will recheck blood work today. - Lipid panel; Future - TSH; Future - TSH - Lipid panel  7. Neuropathy This is a mild and has improved  somewhat.  8. Malignant neoplasm of upper-outer quadrant of left breast in female, estrogen  receptor negative ([place]) He does follow regularly with oncology.  9. B12 deficiency We will recheck levels. - Vitamin B12; Future - Vitamin B12  10. Vitamin D deficiency Recheck levels. - VITAMIN D 25 Hydroxy (Vit-D Deficiency, Fractures); Future - VITAMIN D 25 Hydroxy (Vit-D Deficiency, Fractures)  11. Muscle cramps - Iron, TIBC and Ferritin Panel; Future - Iron, TIBC and Ferritin Panel   Return in about 3 months (around 12/31/2020) for Chronic condition visit.    [name], MD

## 2020-10-03 ENCOUNTER — Other Ambulatory Visit: Payer: Self-pay | Admitting: Internal Medicine

## 2020-10-03 ENCOUNTER — Other Ambulatory Visit: Payer: Self-pay | Admitting: Family Medicine

## 2020-10-04 ENCOUNTER — Other Ambulatory Visit: Payer: Self-pay | Admitting: Internal Medicine

## 2020-10-04 LAB — CBC WITH DIFFERENTIAL/PLATELET
Absolute Monocytes: 429 cells/uL (ref 200–950)
Basophils Absolute: 28 cells/uL (ref 0–200)
Basophils Relative: 0.5 %
Eosinophils Absolute: 160 cells/uL (ref 15–500)
Eosinophils Relative: 2.9 %
HCT: 42.8 % (ref 35.0–45.0)
Hemoglobin: 13.7 g/dL (ref 11.7–15.5)
Lymphs Abs: 1320 cells/uL (ref 850–3900)
MCH: 27.2 pg (ref 27.0–33.0)
MCHC: 32 g/dL (ref 32.0–36.0)
MCV: 84.9 fL (ref 80.0–100.0)
MPV: 10.9 fL (ref 7.5–12.5)
Monocytes Relative: 7.8 %
Neutro Abs: 3564 cells/uL (ref 1500–7800)
Neutrophils Relative %: 64.8 %
Platelets: 271 10*3/uL (ref 140–400)
RBC: 5.04 10*6/uL (ref 3.80–5.10)
RDW: 13.9 % (ref 11.0–15.0)
Total Lymphocyte: 24 %
WBC: 5.5 10*3/uL (ref 3.8–10.8)

## 2020-10-04 LAB — TSH: TSH: 6.5 mIU/L — ABNORMAL HIGH (ref 0.40–4.50)

## 2020-10-04 LAB — COMPREHENSIVE METABOLIC PANEL
AG Ratio: 1.3 (calc) (ref 1.0–2.5)
ALT: 25 U/L (ref 6–29)
AST: 26 U/L (ref 10–35)
Albumin: 4.3 g/dL (ref 3.6–5.1)
Alkaline phosphatase (APISO): 77 U/L (ref 37–153)
BUN: 12 mg/dL (ref 7–25)
CO2: 29 mmol/L (ref 20–32)
Calcium: 9.8 mg/dL (ref 8.6–10.4)
Chloride: 101 mmol/L (ref 98–110)
Creat: 0.67 mg/dL (ref 0.50–0.99)
Globulin: 3.2 g/dL (calc) (ref 1.9–3.7)
Glucose, Bld: 112 mg/dL — ABNORMAL HIGH (ref 65–99)
Potassium: 4.2 mmol/L (ref 3.5–5.3)
Sodium: 142 mmol/L (ref 135–146)
Total Bilirubin: 0.5 mg/dL (ref 0.2–1.2)
Total Protein: 7.5 g/dL (ref 6.1–8.1)

## 2020-10-04 LAB — TEST AUTHORIZATION

## 2020-10-04 LAB — IRON,TIBC AND FERRITIN PANEL
%SAT: 17 % (calc) (ref 16–45)
Ferritin: 41 ng/mL (ref 16–288)
Iron: 67 ug/dL (ref 45–160)
TIBC: 387 mcg/dL (calc) (ref 250–450)

## 2020-10-04 LAB — LIPID PANEL
Cholesterol: 141 mg/dL (ref <200)
HDL: 37 mg/dL — ABNORMAL LOW (ref >=50)
LDL Cholesterol (Calc): 73 mg/dL (calc)
Non-HDL Cholesterol (Calc): 104 mg/dL (calc) (ref <130)
Total CHOL/HDL Ratio: 3.8 (calc) (ref <5.0)
Triglycerides: 226 mg/dL — ABNORMAL HIGH (ref <150)

## 2020-10-04 LAB — T4, FREE: Free T4: 1.2 ng/dL (ref 0.8–1.8)

## 2020-10-04 LAB — HEMOGLOBIN A1C
Hgb A1c MFr Bld: 7.2 % of total Hgb — ABNORMAL HIGH (ref <5.7)
Mean Plasma Glucose: 160 (calc)
eAG (mmol/L): 8.9 (calc)

## 2020-10-04 LAB — T3, FREE: T3, Free: 2.9 pg/mL (ref 2.3–4.2)

## 2020-10-04 LAB — VITAMIN D 25 HYDROXY (VIT D DEFICIENCY, FRACTURES): Vit D, 25-Hydroxy: 57 ng/mL (ref 30–100)

## 2020-10-04 LAB — VITAMIN B12: Vitamin B-12: 898 pg/mL (ref 200–1100)

## 2020-10-06 ENCOUNTER — Telehealth: Payer: Self-pay | Admitting: Family Medicine

## 2020-10-06 MED ORDER — LEVOTHYROXINE SODIUM 50 MCG PO TABS: 50.0000 ug | ORAL_TABLET | Freq: Every day | ORAL | 0 refills | Status: DC

## 2020-10-06 NOTE — Telephone Encounter (Signed)
See results note. 

## 2020-10-06 NOTE — Addendum Note (Signed)
Addended by: Lahoma Crocker A on: 10/06/2020 03:00 PM   Modules accepted: Orders

## 2020-10-06 NOTE — Telephone Encounter (Signed)
Patient called for her lab and X-Ray results

## 2020-10-18 ENCOUNTER — Other Ambulatory Visit: Payer: Self-pay | Admitting: Family Medicine

## 2020-10-18 ENCOUNTER — Inpatient Hospital Stay: Payer: Medicare Other | Attending: Hematology and Oncology | Admitting: Hematology and Oncology

## 2020-10-18 NOTE — Assessment & Plan Note (Deleted)
09/07/2019:Routine screening mammogram detected a 2.1cm mass in the left breast and no left axillary adenopathy. 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  Recommendations: 1. Neoadjuvant chemotherapy with Taxotere/Cytoxanx2starting 09/24/2019(discontinued because of respiratory failure after each chemotherapy) 2. Breast conserving surgery1/05/2020: Microscopic foci of residual IDC grade 3, largest measured 2.5 mm foci of DCIS high-grade, resection margins negative, ER 0%, PR 0%, HER-2 negative, Ki-67 20% on the final path, 0/1 lymph node negative 3. Adjuvant radiation therapy2/09/2020-02/08/2020 --------------------------------------------------------------------------------------------------------------------------------------------- 1.Hospitalization 09/64/3838-18/03/374: Diastolic CHF and asthma exacerbation with hypoxia (prednisone taper and Lasix) 2.Hospitalization 10/30/2019-11/03/2019: Respiratory failure  Treatment plan: Surveillance 1. Breast Exam: 10/18/2020 2. mammogram to be done January 2022  Return to clinic in 1 year for follow-up

## 2020-10-20 ENCOUNTER — Other Ambulatory Visit: Payer: Self-pay | Admitting: Family Medicine

## 2020-10-24 ENCOUNTER — Telehealth: Payer: Self-pay | Admitting: Family Medicine

## 2020-10-24 DIAGNOSIS — M25571 Pain in right ankle and joints of right foot: Secondary | ICD-10-CM

## 2020-10-24 NOTE — Telephone Encounter (Signed)
Pt call and stated she need to know what to do about her ankle because it is getting weaker and a have pain she also stated she want a call back.

## 2020-10-25 IMAGING — DX RIGHT ELBOW - COMPLETE 3+ VIEW
4 series · 4 of 4 positions shown · non-contrast
Comparison: None.

CLINICAL DATA: Acute RIGHT elbow pain following fall. Initial
encounter.

EXAM:
RIGHT ELBOW - COMPLETE 3+ VIEW

[elbow ap (1 of 3)]
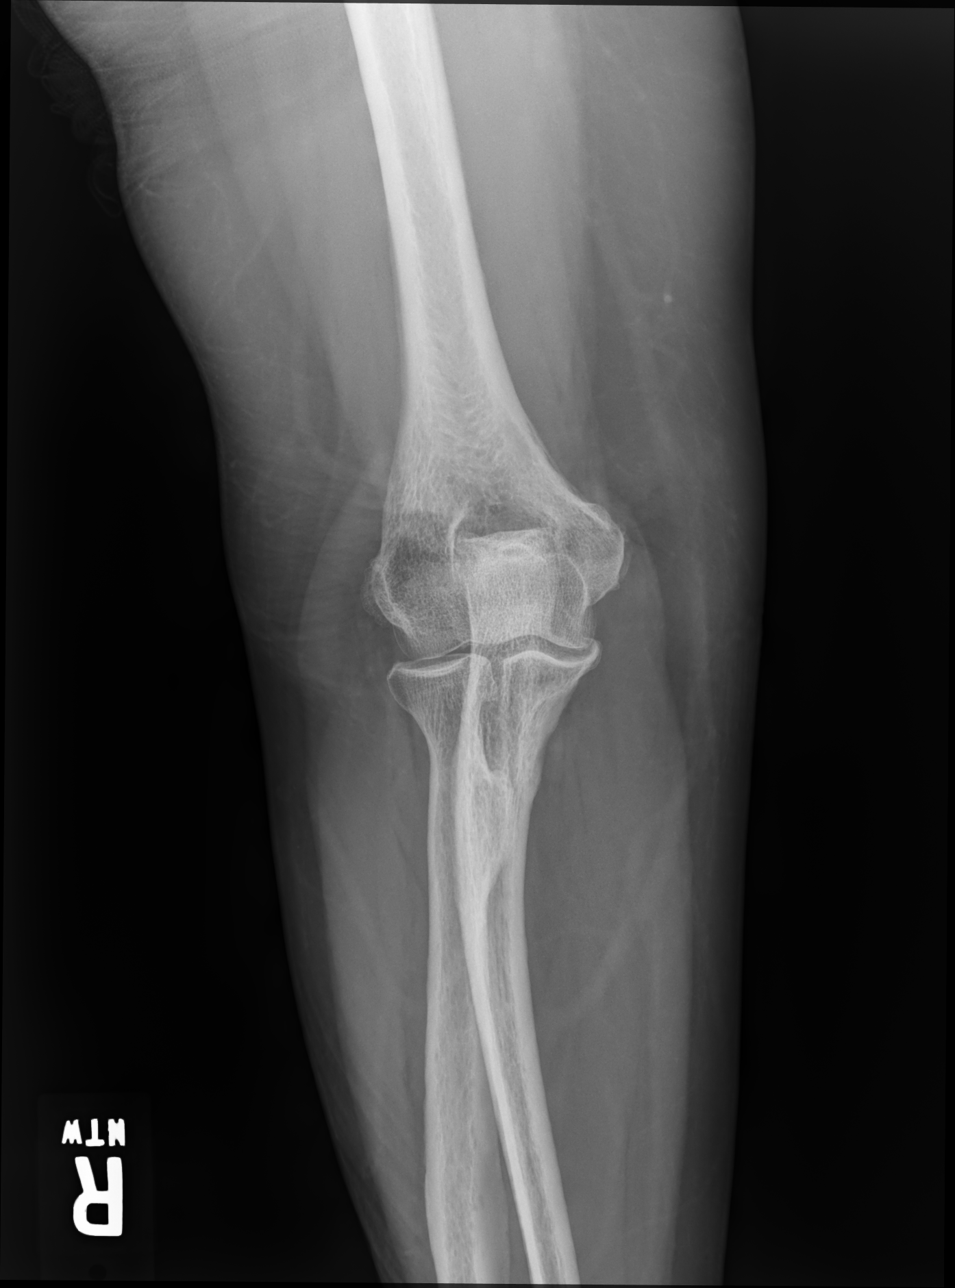

[elbow lat]
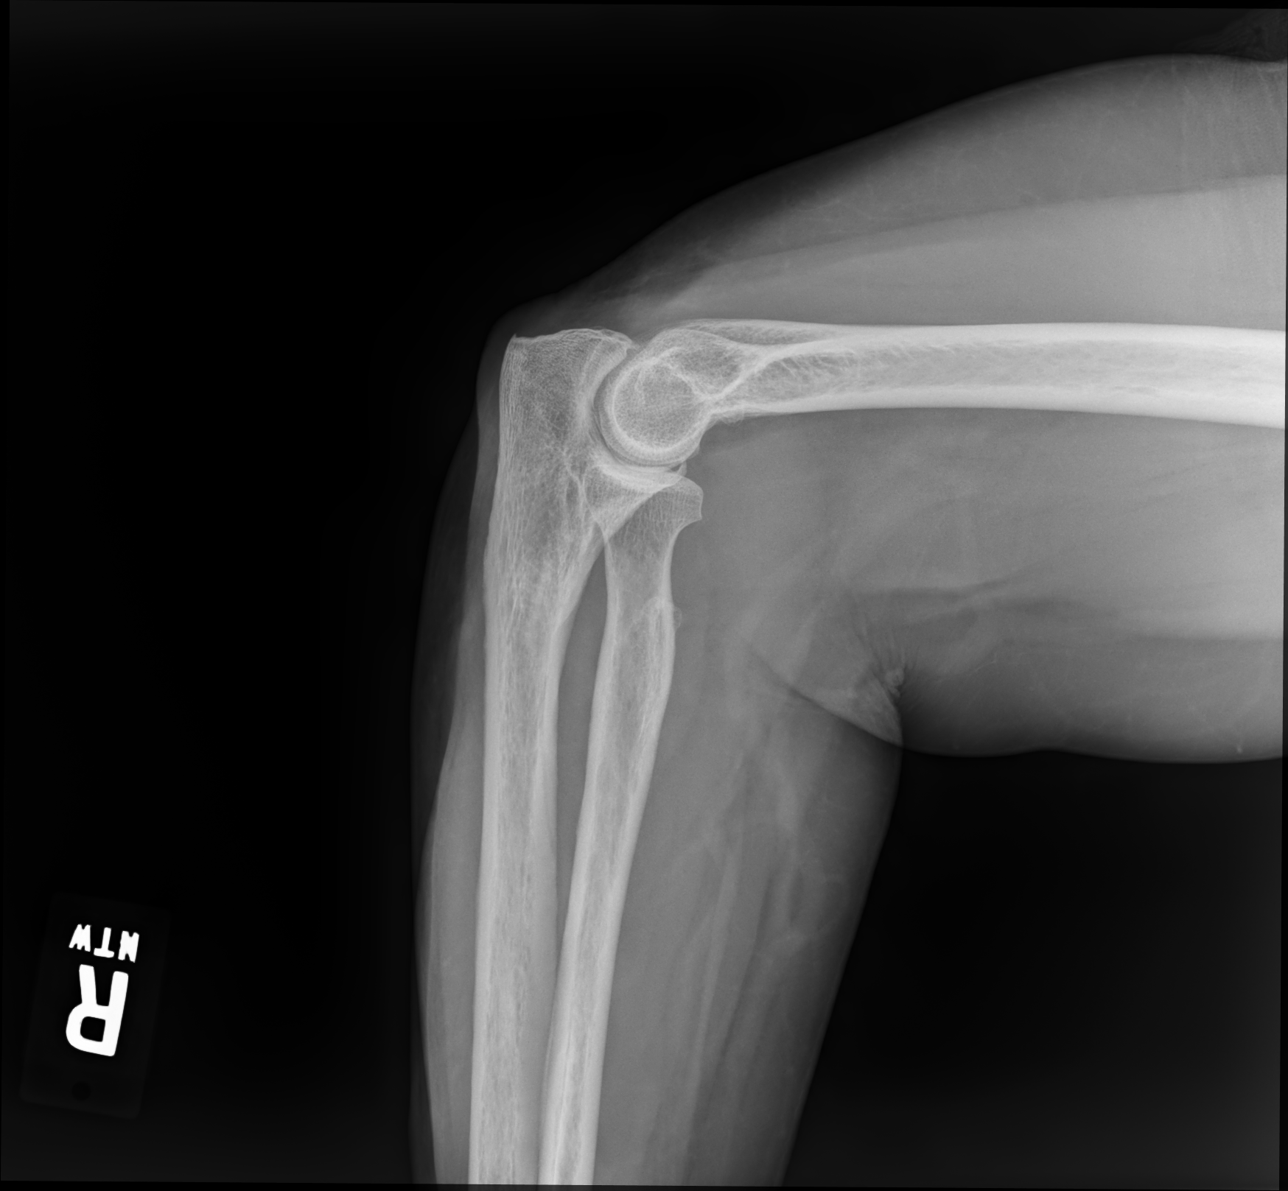

[elbow ap (2 of 3)]
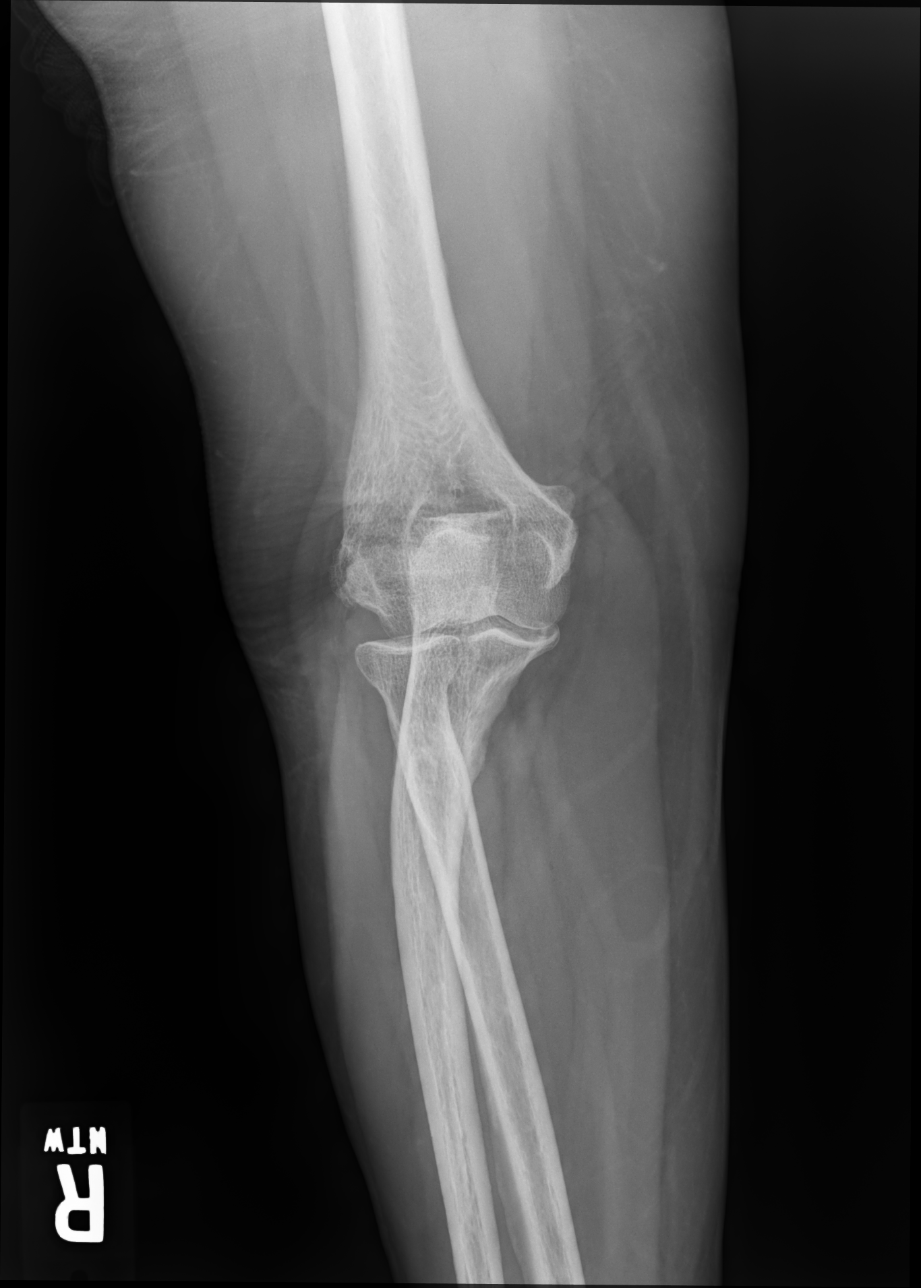

[elbow ap (3 of 3)]
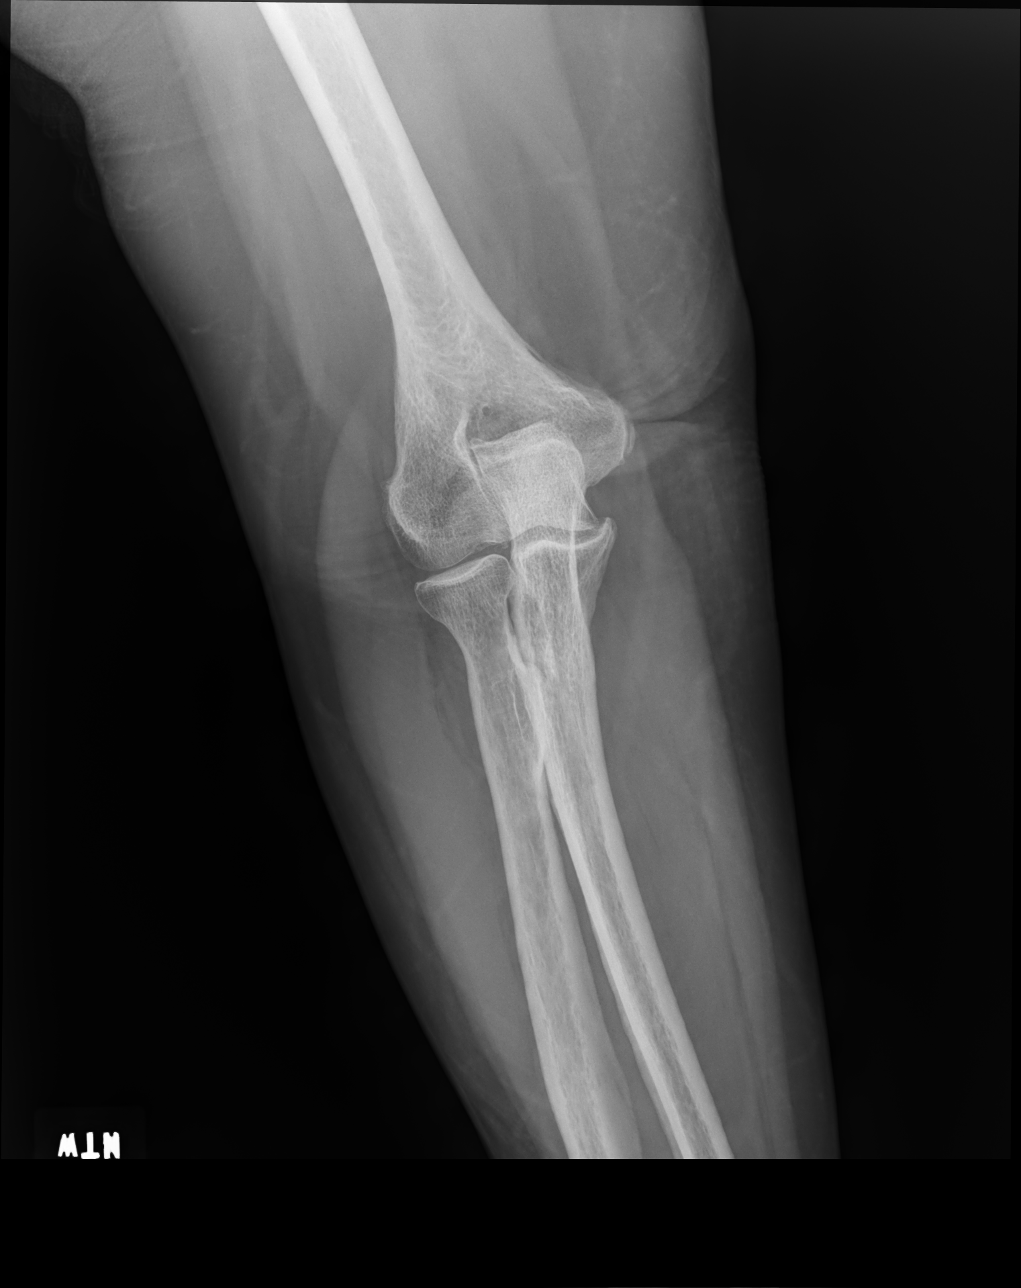

[4 of 4 positions shown; findings below may reference images not displayed]

FINDINGS: There is no evidence of acute fracture, subluxation or dislocation.

Soft tissue calcification along the LATERAL epicondyle may be
posttraumatic or represent epicondylitis.

There is no evidence of joint effusion.

No suspicious focal bony abnormalities are present.
IMPRESSION: 1. No evidence of acute bony abnormality or joint effusion.
2. Soft tissue calcification along the LATERAL epicondyle suggesting
remote injury or LATERAL epicondylitis.

## 2020-10-25 NOTE — Addendum Note (Signed)
Addended by: Agnes Lawrence on: 10/25/2020 08:56 AM   Modules accepted: Orders

## 2020-10-25 NOTE — Telephone Encounter (Signed)
Please refer to ortho. If appointment will take more than 2 weeks to get in let me know because I would consider additional imaging if symptoms are worsening.

## 2020-10-25 NOTE — Telephone Encounter (Signed)
Patient informed of the message below, is aware the referral was placed and will call back if she is not scheduled for an appt in the next 2 weeks.

## 2020-10-26 ENCOUNTER — Other Ambulatory Visit: Payer: Self-pay | Admitting: Family Medicine

## 2020-10-26 DIAGNOSIS — G893 Neoplasm related pain (acute) (chronic): Secondary | ICD-10-CM | POA: Diagnosis not present

## 2020-10-26 DIAGNOSIS — G894 Chronic pain syndrome: Secondary | ICD-10-CM | POA: Diagnosis not present

## 2020-10-26 DIAGNOSIS — M47816 Spondylosis without myelopathy or radiculopathy, lumbar region: Secondary | ICD-10-CM | POA: Diagnosis not present

## 2020-10-26 DIAGNOSIS — Z79891 Long term (current) use of opiate analgesic: Secondary | ICD-10-CM | POA: Diagnosis not present

## 2020-11-01 ENCOUNTER — Telehealth: Payer: Self-pay | Admitting: *Deleted

## 2020-11-01 ENCOUNTER — Other Ambulatory Visit: Payer: Self-pay | Admitting: Adult Health

## 2020-11-01 ENCOUNTER — Telehealth: Payer: Self-pay | Admitting: Hematology and Oncology

## 2020-11-01 DIAGNOSIS — C50412 Malignant neoplasm of upper-outer quadrant of left female breast: Secondary | ICD-10-CM

## 2020-11-01 NOTE — Telephone Encounter (Signed)
Scheduled appointment per 11/30 sch msg. Called patient, no answer and no voicemail set up. Mailed calendar to patient with appointment date and time.

## 2020-11-01 NOTE — Telephone Encounter (Signed)
Received call from pt regarding scheduling f/u with Dr. Lindi Adie.  Per last office note pt needing to f/u in November.  RN attempt x1 to contact pt.  Unable to LVM due to VM not being set up.  RN sent scheduling message to schedule pt within the next 2 weeks.

## 2020-11-02 ENCOUNTER — Other Ambulatory Visit: Payer: Self-pay | Admitting: Internal Medicine

## 2020-11-03 NOTE — Addendum Note (Signed)
Addended by: Marrion Coy on: 11/03/2020 03:25 PM   Modules accepted: Orders

## 2020-11-07 ENCOUNTER — Ambulatory Visit: Payer: Medicare Other | Admitting: Podiatry

## 2020-11-07 ENCOUNTER — Encounter: Payer: Self-pay | Admitting: Podiatry

## 2020-11-07 ENCOUNTER — Other Ambulatory Visit: Payer: Self-pay

## 2020-11-07 DIAGNOSIS — B351 Tinea unguium: Secondary | ICD-10-CM | POA: Diagnosis not present

## 2020-11-07 DIAGNOSIS — L600 Ingrowing nail: Secondary | ICD-10-CM

## 2020-11-07 DIAGNOSIS — E1151 Type 2 diabetes mellitus with diabetic peripheral angiopathy without gangrene: Secondary | ICD-10-CM | POA: Diagnosis not present

## 2020-11-13 NOTE — Progress Notes (Signed)
Subjective: Shelby Anderson presents today for follow up of preventative diabetic foot care with and painful mycotic nails b/l that are difficult to trim. Pain interferes with ambulation. Aggravating factors include wearing enclosed shoe gear. Pain is relieved with periodic professional debridement.   Right great toe is tender on today's visit.  PCP is Dr. [name] and last visit was 09/30/2020.  Allergies  Allergen Reactions   Lodine [Etodolac] Anaphylaxis, Hives and Swelling   Oxycontin [Oxycodone Hcl] Anaphylaxis    hives, trouble breathing, tongue swelling (Only Oxycontin) Tolerates plain oxycodone.   Penicillins Anaphylaxis    Told by a surgeon never to take it again. Has patient had a PCN reaction causing immediate rash, facial/tongue/throat swelling, SOB or lightheadedness with hypotension: Yes Has patient had a PCN reaction causing severe rash involving mucus membranes or skin necrosis: Unknown Has patient had a PCN reaction that required hospitalization: No Has patient had a PCN reaction occurring within the last 10 years: No If all of the above answers are "NO", then may proceed with Cephalosporin use.   Aspirin Other (See Comments)    High-dose caused GI Bleeds   Darvocet [Propoxyphene N-Acetaminophen] Hives   Nitroglycerin Other (See Comments)    IV-BP drops dramatically Can take po   Tramadol Hives and Itching   Ultram [Tramadol Hcl] Hives   Valium Other (See Comments)    Circulation problems. "Legs turned black".     Objective: There were no vitals filed for this visit.  Vascular Examination:  Capillary refill time to digits immediate b/l, faintly palpable DP pulses b/l, non-palpable PT pulses b/l, pedal hair absent b/l and skin temperature gradient within normal limits b/l  Dermatological Examination: Pedal skin with normal turgor, texture and tone bilaterally, no open wounds bilaterally, no interdigital macerations bilaterally and toenails  1-5 b/l elongated, dystrophic, thickened, crumbly with subungual debris.  Right hallux nailplate has grown back and is embedded at the distal edge with tenderness to palpation. No drainage nor cellulitis noted. Incurvated nailplate left great toe b/l border(s) with tenderness to palpation. No erythema, no edema, no drainage noted.  Musculoskeletal: Normal muscle strength 5/5 to all lower extremity muscle groups bilaterally, no gross bony deformities bilaterally and no pain crepitus or joint limitation noted with ROM b/l  Neurological: Protective sensation intact 5/5 intact bilaterally with 10g monofilament b/l  Assessment: 1. Onychomycosis   2. Ingrown toenail without infection   3. Diabetes mellitus type 2 with peripheral artery disease (HCC)    Plan: -Continue diabetic foot care principles. Literature dispensed on today.  -Right great toe nail plate debrided and curretaged. Distal edge cleaned. Left hallux debrided and curretaged at medial and lateral nail borders. Both digits cleansed. Antibiotic ointment applied. She was advised to apply Vaseline Petroleum Jelly to skin around toenails. -Toenails 2, 4, 5 right and 1-5 left were debrided in length and girth without iatrogenic bleeding. -Patient to continue soft, supportive shoe gear daily. -Patient to report any pedal injuries to medical professional immediately. -Patient/POA to call should there be question/concern in the interim.  Return in about 3 months (around 02/05/2021) for diabetic foot care.

## 2020-11-14 ENCOUNTER — Ambulatory Visit (INDEPENDENT_AMBULATORY_CARE_PROVIDER_SITE_OTHER): Payer: Medicare Other | Admitting: Orthopedic Surgery

## 2020-11-14 ENCOUNTER — Other Ambulatory Visit: Payer: Self-pay

## 2020-11-14 DIAGNOSIS — M14879 Arthropathies in other specified diseases classified elsewhere, unspecified ankle and foot: Secondary | ICD-10-CM

## 2020-11-14 DIAGNOSIS — E11618 Type 2 diabetes mellitus with other diabetic arthropathy: Secondary | ICD-10-CM | POA: Diagnosis not present

## 2020-11-15 ENCOUNTER — Other Ambulatory Visit: Payer: Self-pay | Admitting: Family Medicine

## 2020-11-15 ENCOUNTER — Encounter: Payer: Self-pay | Admitting: Orthopedic Surgery

## 2020-11-15 ENCOUNTER — Inpatient Hospital Stay: Payer: Medicare Other | Attending: Hematology and Oncology | Admitting: Hematology and Oncology

## 2020-11-15 NOTE — Progress Notes (Signed)
Office Visit Note   Patient: Shelby Anderson           Date of Birth: May 28, 1956           MRN: [id number] Visit Date: 11/14/2020              Requested by: Caren Macadam, MD [place],  [place] [address] PCP: Caren Macadam, MD  Chief Complaint  Patient presents with  . Right Ankle - Pain      HPI: Patient is a 64 year old woman with diabetic insensate neuropathy she has had multiple episodes of instability with her right ankle and foot.  She had a fall and a twisting injury back in September of this year.  Patient states she has had an old fracture of the ankle but has the most pain on uneven terrain and feels unstable.  She states she has tried an ankle stabilizing orthosis that made her foot worse.  She states she cannot drive due to the pain over the sinus Tarsi area she states her foot feels unsteady on uneven terrain.  Patient states her most recent hemoglobin A1c was 7.9.  Assessment & Plan: Visit Diagnoses:  1. 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  Discussed that we could proceed with a subtalar fusion this would make her foot stiff on uneven terrain but should resolve the instability and pain from the subtalar joint.  Risks and benefits were discussed including risk of a wound not healing risk of the skin not healing need for additional surgery.  Patient states she understands and wishes to proceed with outpatient subtalar fusion at this time.  Follow-Up Instructions: Return if symptoms worsen or fail to improve, for Follow-up 1 week after surgery.   Ortho Exam  Patient is alert, oriented, no adenopathy, well-dressed, normal affect, normal respiratory effort. Examination patient has a good dorsalis pedis pulse.  She has good range of motion of the ankle which is asymptomatic palpation of the anterior aspect of the  ankle is asymptomatic.  Patient's pain is reproduced with attempted subtalar motion of only about 20 degrees of internal and external rotation.  She is maximally tender to palpation over the sinus Tarsi with over the posterior facet.  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  There is bony spurs over the medial lateral malleolus consistent with previous ligamentous injuries to the ankle.  Imaging: No results found. No images are attached to the encounter.  Labs: Lab Results  Component Value Date   HGBA1C 7.2 (H) 09/30/2020   HGBA1C 7.7 (H) 10/31/2019   HGBA1C 6.8 (H) 09/17/2019   CRP 4.1 (H) 10/31/2019   REPTSTATUS 11/04/2019 FINAL 10/30/2019   REPTSTATUS 11/04/2019 FINAL 10/30/2019   REPTSTATUS 11/01/2019 FINAL 10/30/2019   GRAMSTAIN  12/24/2013    ABUNDANT WBC PRESENT,BOTH PMN AND MONONUCLEAR NO SQUAMOUS EPITHELIAL CELLS SEEN RARE GRAM POSITIVE COCCI IN PAIRS Performed at [facility]  10/30/2019    NO GROWTH 5 DAYS Performed at [facility], [place] [address], [place], [place] [address]    CULT  10/30/2019    NO GROWTH 5 DAYS Performed at [facility] [address], [place], [place] [address]    CULT (A) 10/30/2019    <10,000 COLONIES/mL INSIGNIFICANT GROWTH Performed at  [facility], [place] [address] [place], [place] [address]      Lab Results  Component Value Date   ALBUMIN 3.3 (L) 12/08/2019   ALBUMIN 3.2 (L) 11/05/2019   ALBUMIN 3.2 (L) 11/01/2019    Lab Results  Component Value Date   MG 1.6 (L) 10/31/2019   MG 1.8 10/15/2019   MG 1.5 (L) 10/02/2019   Lab Results  Component Value Date   VD25OH 57 09/30/2020    No results found for: PREALBUMIN CBC EXTENDED Latest Ref Rng & Units 09/30/2020 12/08/2019 11/05/2019  WBC 3.8 - 10.8 Thousand/uL 5.5 5.7 15.7(H)  RBC 3.80 - 5.10 Million/uL 5.04 4.25 3.86(L)  HGB 11.7 - 15.5 g/dL 13.7 12.5 11.3(L)  HCT 35.0  - 45.0 % 42.8 40.9 35.9(L)  PLT 140 - 400 Thousand/uL 271 265 407(H)  NEUTROABS 1,500 - 7,800 cells/uL 3,564 - 13.1(H)  LYMPHSABS 850 - 3,900 cells/uL 1,320 - 1.4     There is no height or weight on file to calculate BMI.  Orders:  No orders of the defined types were placed in this encounter.  No orders of the defined types were placed in this encounter.    Procedures: No procedures performed  Clinical Data: No additional findings.  ROS:  All other systems negative, except as noted in the HPI. Review of Systems  Objective: Vital Signs: There were no vitals taken for this visit.  Specialty Comments:  No specialty comments available.  PMFS History: Patient Active Problem List   Diagnosis Date Noted  . Acute on chronic diastolic CHF (congestive heart failure) ([place]) 10/01/2019  . Neutropenia with fever ([place]) 10/01/2019  . Genetic testing 09/17/2019  . Family history of melanoma   . Family history of pancreatic cancer   . Malignant neoplasm of upper-outer quadrant of left breast in female, estrogen receptor negative ([place]) 09/07/2019  . Encounter for loop recorder at end of battery life 04/24/2018  . Esophageal stricture 07/01/2017  . Hyperlipidemia associated with type 2 diabetes mellitus ([place]) 05/20/2017  . Allergic rhinitis 03/11/2017  . Dilated cardiomyopathy ([place]) 02/07/2017  . 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 Morbid obesity due to excess calories ([place]) 09/26/2016  . S/P left TKA 09/25/2016  . Increased endometrial stripe thickness 06/27/2016  . Intramural leiomyoma of uterus 06/27/2016  . Type 2 diabetes mellitus without complication, without long-term current use of insulin ([place]) 12/06/2015  . Chronic respiratory failure ([place]) 09/15/2015  . Hypertension associated with diabetes ([place]) 07/19/2015  . Arrhythmia 07/19/2015  . Orthostatic hypotension 07/19/2015  . GERD (gastroesophageal reflux disease) 07/19/2015  . Chronic back pain  07/19/2015  . Neuropathy 07/19/2015  . Symptomatic PVCs 11/02/2014  . Syncope 11/02/2014  . Chest pain 12/23/2013  . Disorder of cervix 03/10/2013  . Vaginal atrophy 03/10/2013  . OSA (obstructive sleep apnea) 07/31/2012  . Asthma 04/29/2012  . Coronary artery disease 11/20/2011   Past Medical History:  Diagnosis Date  . Allergy   . Anemia    "chronic"  . Angina    Prinzmetal angina  . Anxiety and depression   . Arthritis   . Asthma   . Atrial fibrillation ([place])    h/o "AF w/frequent PVCs"  . Breast cancer ([place])    Left  . Cancer (HCC)    hx of skin cancer   . CHF (congestive heart failure) ([place])   . Chronic back pain greater than 3 months duration    on chronic narcotics, treated at pain clinic  .  Colon polyps    hyperplastic  . Coronary artery disease    Arrythmia, orthostatic hypotension, HLD, HTN; sees Dr. Einar Gip  . Difficult intubation    "TMJ & woke up when they were still cutting on me"  . Dysrhythmia    sees Dr. Einar Gip and a cardiologist at [facility]  . Esophageal stricture   . Family history of melanoma ]  . Family history of pancreatic cancer   . Fatty liver   . Fatty liver   . Fibroids   . Fibromyalgia    "in my legs"  . GERD (gastroesophageal reflux disease)    hx hiatal hernia, stricture and gastric ulcer  . Headache(784.0)    migraines  . Heart murmur   . Hiatal hernia   . History of loop recorder   . History of migraines    "dx'd when I was in my teens"  . Hyperlipemia   . Hypertension   . Mental disorder   . Mild episode of recurrent major depressive disorder ([place]) 12/06/2015  . Myocardial infarction [facility]) 1980's & 1990;   sees Dr. Einar Gip  . OSA (obstructive sleep apnea)   . Pneumonia    multiple times  . PONV (postoperative nausea and vomiting)   . Recurrent upper respiratory infection (URI)   . Shortness of breath 11/20/11   "all the time", sees pulmonlogy, ? asthma  . Stenotic cervical os   . Stomach ulcer    "3 small; found in  05/2011"  . TMJ (dislocation of temporomandibular joint)   . Tuberculosis    + TB SKIN TEST  . Type 2 diabetes mellitus without complication, without long-term current use of insulin ([place]) 12/06/2015   not on meds     Family History  Problem Relation Age of Onset  . Malignant hyperthermia Father   . Hypertension Father   . Heart disease Father   . Diabetes Father   . Cancer Father        skin  . Hypertension Mother   . Heart disease Mother   . Multiple myeloma Mother   . Cancer Sister        CERVICAL  . Hypertension Sister   . Cancer Brother 22       MELANOMA  . Heart disease Maternal Grandmother   . Other Maternal Grandmother 32       complications of childbirth  . Heart disease Maternal Grandfather   . Cancer Paternal Grandmother        ?   Marland Kitchen Heart disease Paternal Grandmother   . Heart disease Paternal Grandfather   . Cancer Brother        LUNG  . Diabetes Sister   . Hypertension Sister   . Heart disease Sister   . Cancer Sister   . Cancer Brother   . Pancreatic cancer Niece 36  . Cancer Nephew 40       unknown- currently in the [employer]  . Anesthesia problems Neg Hx   . Hypotension Neg Hx   . Pseudochol deficiency Neg Hx   . Colon cancer Neg Hx   . Esophageal cancer Neg Hx   . Rectal cancer Neg Hx   . Stomach cancer Neg Hx   . Breast cancer Neg Hx     Past Surgical History:  Procedure Laterality Date  . ACHILLES TENDON REPAIR  1970's   left ankle  . ARTHROSCOPIC REPAIR ACL     left knee cap  . BREAST BIOPSY Right 04/08/2013   again  in October or November 2020  . BREAST LUMPECTOMY WITH RADIOACTIVE SEED AND SENTINEL LYMPH NODE BIOPSY Left 12/09/2019   Procedure: LEFT BREAST LUMPECTOMY WITH RADIOACTIVE SEED AND LEFT AXILLARY SENTINEL LYMPH NODE BIOPSY;  Surgeon: Rolm Bookbinder, MD;  Location: [facility];  Service: General;  Laterality: Left;  PEC BLOCK  . CARDIAC CATHETERIZATION     loop recorder  . CARPAL TUNNEL RELEASE  unknown   left hand  . COLONOSCOPY     . ESOPHAGOGASTRODUODENOSCOPY (EGD) WITH PROPOFOL N/A 10/29/2017   Procedure: ESOPHAGOGASTRODUODENOSCOPY (EGD) WITH PROPOFOL;  Surgeon: Irene Shipper, MD;  Location: [facility];  Service: Endoscopy;  Laterality: N/A;  . LOOP RECORDER IMPLANT    . PORT-A-CATH REMOVAL N/A 12/09/2019   Procedure: REMOVAL PORT-A-CATH;  Surgeon: Rolm Bookbinder, MD;  Location: [facility];  Service: General;  Laterality: N/A;  . PORTACATH PLACEMENT N/A 09/23/2019   Procedure: INSERTION PORT-A-CATH Right Internal Doreen Salvage;  Surgeon: Rolm Bookbinder, MD;  Location: [facility];  Service: General;  Laterality: N/A;  . post ganglionectomy  1970's   "for migraine headaches"  . pouch string  [phone]   "did this 3 times (once w/each pregnancy)"  . SAVORY DILATION N/A 10/29/2017   Procedure: SAVORY DILATION;  Surgeon: Irene Shipper, MD;  Location: [facility];  Service: Endoscopy;  Laterality: N/A;  . TOTAL KNEE ARTHROPLASTY Left 09/25/2016   Procedure: LEFT TOTAL KNEE ARTHROPLASTY;  Surgeon: Paralee Cancel, MD;  Location: [facility];  Service: Orthopedics;  Laterality: Left;  . TUBAL LIGATION  1980's   Social History   Occupational History  . Occupation: Retired Therapist, sports  Tobacco Use  . Smoking status: Never Smoker  . Smokeless tobacco: Never Used  Vaping Use  . Vaping Use: Never used  Substance and Sexual Activity  . Alcohol use: No    Alcohol/week: 0.0 standard drinks  . Drug use: No  . Sexual activity: Not Currently    Birth control/protection: Surgical    Comment: 1st intercourse 64 yo-Fewer than 5 partners

## 2020-11-18 ENCOUNTER — Telehealth: Payer: Self-pay | Admitting: Hematology and Oncology

## 2020-11-18 NOTE — Telephone Encounter (Signed)
Scheduled appts per 12/17 incoming call from pt requesting to reschedule missed appt on 12/14. Pt confirmed appt date and time.

## 2020-11-20 ENCOUNTER — Other Ambulatory Visit: Payer: Self-pay | Admitting: Family Medicine

## 2020-11-21 ENCOUNTER — Ambulatory Visit: Payer: Medicare Other | Admitting: Hematology and Oncology

## 2020-11-21 ENCOUNTER — Telehealth: Payer: Self-pay | Admitting: Licensed Clinical Social Worker

## 2020-11-21 ENCOUNTER — Other Ambulatory Visit: Payer: Self-pay
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
## 2020-11-21 NOTE — Assessment & Plan Note (Deleted)
09/07/2019:Routine screening mammogram detected a 2.1cm mass in the left breast and no left axillary adenopathy. 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  Recommendations: 1. Neoadjuvant chemotherapy with Taxotere/Cytoxanx2starting 09/24/2019(discontinued because of respiratory failure after each chemotherapy) 2. Breast conserving surgery1/05/2020: Microscopic foci of residual IDC grade 3, largest measured 2.5 mm foci of DCIS high-grade, resection margins negative, ER 0%, PR 0%, HER-2 negative, Ki-67 20% on the final path, 0/1 lymph node negative 3. Adjuvant radiation therapy2/09/2020-02/08/2020 --------------------------------------------------------------------------------------------------------------------------------------------- 1.Hospitalization 27/14/2320-08/06/1790: Diastolic CHF and asthma exacerbation with hypoxia (prednisone taper and Lasix) 2.Hospitalization 10/30/2019-11/03/2019: Respiratory failure  Treatment plan: Surveillance  Breast cancer surveillance: 1.  Breast exam 11/21/2020: Benign 2. mammogram scheduled for 12/09/2020  Return to clinic in 1 year for follow-up

## 2020-11-23 DIAGNOSIS — G894 Chronic pain syndrome: Secondary | ICD-10-CM | POA: Diagnosis not present

## 2020-11-23 DIAGNOSIS — M47816 Spondylosis without myelopathy or radiculopathy, lumbar region: Secondary | ICD-10-CM | POA: Diagnosis not present

## 2020-11-23 DIAGNOSIS — Z79891 Long term (current) use of opiate analgesic: Secondary | ICD-10-CM | POA: Diagnosis not present

## 2020-11-23 DIAGNOSIS — G893 Neoplasm related pain (acute) (chronic): Secondary | ICD-10-CM | POA: Diagnosis not present

## 2020-12-02 ENCOUNTER — Other Ambulatory Visit: Payer: Self-pay | Admitting: Family Medicine

## 2020-12-02 ENCOUNTER — Other Ambulatory Visit: Payer: Self-pay | Admitting: Hematology and Oncology

## 2020-12-02 DIAGNOSIS — C50411 Malignant neoplasm of upper-outer quadrant of right female breast: Secondary | ICD-10-CM

## 2020-12-02 DIAGNOSIS — Z171 Estrogen receptor negative status [ER-]: Secondary | ICD-10-CM

## 2020-12-07 ENCOUNTER — Ambulatory Visit (INDEPENDENT_AMBULATORY_CARE_PROVIDER_SITE_OTHER): Payer: Medicare Other | Admitting: *Deleted

## 2020-12-07 ENCOUNTER — Other Ambulatory Visit: Payer: Medicare Other

## 2020-12-07 ENCOUNTER — Other Ambulatory Visit: Payer: Self-pay

## 2020-12-07 DIAGNOSIS — R7989 Other specified abnormal findings of blood chemistry: Secondary | ICD-10-CM

## 2020-12-07 DIAGNOSIS — Z23 Encounter for immunization: Secondary | ICD-10-CM

## 2020-12-07 LAB — TSH: TSH: 2.99 u[IU]/mL (ref 0.35–4.50)

## 2020-12-09 ENCOUNTER — Ambulatory Visit
Admission: RE | Admit: 2020-12-09 | Discharge: 2020-12-09 | Disposition: A | Discharge status: Home / Self Care | Payer: Medicare Other | Source: Ambulatory Visit | Attending: Adult Health | Admitting: Adult Health

## 2020-12-09 ENCOUNTER — Other Ambulatory Visit: Payer: Self-pay

## 2020-12-09 DIAGNOSIS — Z853 Personal history of malignant neoplasm of breast: Secondary | ICD-10-CM | POA: Diagnosis not present

## 2020-12-09 DIAGNOSIS — C50412 Malignant neoplasm of upper-outer quadrant of left female breast: Secondary | ICD-10-CM

## 2020-12-09 DIAGNOSIS — R922 Inconclusive mammogram: Secondary | ICD-10-CM | POA: Diagnosis not present

## 2020-12-09 HISTORY — DX: Personal history of irradiation: Z92.3

## 2020-12-09 HISTORY — DX: Personal history of antineoplastic chemotherapy: Z92.21

## 2020-12-12 NOTE — Progress Notes (Signed)
Patient Care Team: Caren Macadam, MD as PCP - General (Family Medicine) Rigoberto Noel, MD as Consulting Physician (Pulmonary Disease) Adrian Prows, MD as Consulting Physician (Cardiology) Lenon Oms, MD as Referring Physician (Obstetrics and Gynecology) Veneda Melter, MD as Referring Physician (Cardiology) [name], MD as Consulting Physician (Anesthesiology) Rolm Bookbinder, MD as Consulting Physician (General Surgery) Nicholas Lose, MD as Consulting Physician (Hematology and Oncology) Kyung Rudd, MD as Consulting Physician (Radiation Oncology) Alda Berthold, DO as Consulting Physician (Neurology) Jarome Matin, MD as Consulting Physician (Dermatology)  DIAGNOSIS:    ICD-10-CM   1. Malignant neoplasm of upper-outer quadrant of left breast in female, estrogen receptor negative (Shelby Anderson)  C50.412    Z17.1     SUMMARY OF ONCOLOGIC HISTORY: Oncology History  Malignant neoplasm of upper-outer quadrant of left breast in female, estrogen receptor negative (Shelby Anderson)  09/07/2019 Initial Diagnosis   Routine screening mammogram detected a 2.1cm mass in the left breast and no left axillary adenopathy. 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   09/15/2019 Genetic Testing   Genetic testing reported out on September 15, 2019 through the Multi-cancer panel found no pathogenic mutations. The Multi-Gene Panel offered by [facility] includes sequencing and/or deletion duplication testing of the following 85 genes: AIP, ALK, APC, ATM, AXIN2,BAP1,  BARD1, BLM, BMPR1A, BRCA1, BRCA2, BRIP1, CASR, CDC73, CDH1, CDK4, CDKN1B, CDKN1C, CDKN2A (p14ARF), CDKN2A (p16INK4a), CEBPA, CHEK2, CTNNA1, DICER1, DIS3L2, EGFR (c.2369C>T, p.Thr790Met variant only), EPCAM (Deletion/duplication testing only), FH, FLCN, GATA2, GPC3, GREM1 (Promoter region deletion/duplication testing only), HOXB13 (c.251G>A, p.Gly84Glu), HRAS, KIT, MAX, MEN1, MET, MITF (c.952G>A, p.Glu318Lys variant  only), MLH1, MSH2, MSH3, MSH6, MUTYH, NBN, NF1, NF2, NTHL1, PALB2, PDGFRA, PHOX2B, PMS2, POLD1, POLE, POT1, PRKAR1A, PTCH1, PTEN, RAD50, RAD51C, RAD51D, RB1, RECQL4, RET, RNF43, RUNX1, SDHAF2, SDHA (sequence changes only), SDHB, SDHC, SDHD, SMAD4, SMARCA4, SMARCB1, SMARCE1, STK11, SUFU, TERC, TERT, TMEM127, TP53, TSC1, TSC2, VHL, WRN and WT1.  The test report has been scanned into EPIC and is located under the Molecular Pathology section of the Results Review tab.  A portion of the result report is included below for reference.    09/24/2019 - 10/15/2019 Chemotherapy   palonosetron (ALOXI) injection 0.25 mg, 0.25 mg, Intravenous,  Once, 2 of 6 cycles. Administration: 0.25 mg (09/24/2019), 0.25 mg (10/15/2019)  pegfilgrastim (NEULASTA ONPRO KIT) injection 6 mg, 6 mg, Subcutaneous, Once, 2 of 6 cycles. Administration: 6 mg (09/24/2019), 6 mg (10/15/2019)  cyclophosphamide (CYTOXAN) 1,100 mg in sodium chloride 0.9 % 250 mL chemo infusion, 500 mg/m2 = 1,100 mg (83.3 % of original dose 600 mg/m2), Intravenous,  Once, 2 of 6 cycles. Dose modification: 500 mg/m2 (original dose 600 mg/m2, Cycle 1, Reason: Provider Judgment). Administration: 1,100 mg (09/24/2019), 1,100 mg (10/15/2019)  DOCEtaxel (TAXOTERE) 140 mg in sodium chloride 0.9 % 250 mL chemo infusion, 65 mg/m2 = 140 mg (86.7 % of original dose 75 mg/m2), Intravenous,  Once, 2 of 6 cycles. Dose modification: 65 mg/m2 (original dose 75 mg/m2, Cycle 1, Reason: Provider Judgment). Administration: 140 mg (09/24/2019), 140 mg (10/15/2019)  fosaprepitant (EMEND) 150 mg in sodium chloride 0.9 % 145 mL IVPB, Intravenous,  Once, 2 of 6 cycles. Administration:  (09/24/2019),  (10/15/2019)   12/09/2019 Surgery   Left lumpectomy Shelby Anderson) [phone]): microscopic focus of residual IDC, grade 3, with high grade DCIS, clear margins. No regional lymph nodes were examined.   12/10/2019 Cancer Staging   Staging form: Breast, AJCC 8th Edition - Pathologic stage  from  12/10/2019: No Stage Recommended (ypT1a, pN0, cM0) - Signed by Gardenia Phlegm, NP on 12/23/2019   01/13/2020 - 02/08/2020 Radiation Therapy   The patient initially received a dose of 42.56 Gy in 16 fractions to the breast using whole-breast tangent fields. This was delivered using a 3-D conformal technique. The pt received a boost delivering an additional 8 Gy in 4 fractions using a electron boost with 82mV electrons. The total dose was 50.56 Gy.     CHIEF COMPLIANT: Follow-up of triple negative left breast cancer  INTERVAL HISTORY: Shelby VENNEis a 65y.o. 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 Mammogram on 12/09/20 showed no evidence of malignancy bilaterally. She presents to the clinic today dfor follow-up.  She reports that she no longer has any adverse effects from prior chemo.  Energy levels are excellent.  Denies any neuropathy.    ALLERGIES:  is allergic to lodine [etodolac], oxycontin [oxycodone hcl], penicillins, aspirin, darvocet [propoxyphene n-acetaminophen], nitroglycerin, tramadol, ultram [tramadol hcl], and valium.  MEDICATIONS:  Current Outpatient Medications  Medication Sig Dispense Refill   albuterol (PROVENTIL) (2.5 MG/3ML) 0.083% nebulizer solution Take 3 mLs (2.5 mg total) by nebulization every 6 (six) hours as needed for wheezing or shortness of breath (J45.40). (Patient taking differently: Take 2.5 mg by nebulization every 6 (six) hours as needed for wheezing or shortness of breath. ) 75 mL 3   albuterol (VENTOLIN HFA) 108 (90 Base) MCG/ACT inhaler Inhale 2 puffs into the lungs every 6 (six) hours as needed for wheezing or shortness of breath. 3 g 3   aspirin EC 81 MG tablet Take 81 mg at bedtime by mouth.      atorvastatin (LIPITOR) 40 MG tablet TAKE 1 TABLET BY MOUTH EVERYDAY AT BEDTIME. **DUE FOR YEARLY PHYSICAL** 30 tablet 5   Blood  Glucose Monitoring Suppl (ONE TOUCH ULTRA 2) w/Device KIT USE TO CHECK BLOOD SUGAR 2 TIMES A DAY 1 kit 0   budesonide-formoterol (SYMBICORT) 80-4.5 MCG/ACT inhaler INHALE 2 PUFFS INTO THE LUNGS EVERY MORNING AND ANOTHER 2 PUFFS 12 HOURS LATER 30.6 Inhaler 1   Calcium Carbonate-Vitamin D 600-200 MG-UNIT TABS Take 1 tablet by mouth daily.     CINNAMON PO Take 1,000 mg 2 (two) times daily by mouth.     clobetasol cream (TEMOVATE) 0.05 % Apply topically 2 (two) times daily. (Patient taking differently: Apply topically daily as needed (rash). ) 30 g 0   doxepin (SINEQUAN) 10 MG capsule Take 20 mg by mouth at bedtime.      famotidine (PEPCID) 20 MG tablet TAKE 1 TABLET BY MOUTH 2 TIMES DAILY AS NEEDED FOR HEARTBURN OR INDIGESTION. 180 tablet 1   fluticasone (FLONASE) 50 MCG/ACT nasal spray Place 2 sprays at bedtime as needed into both nostrils for allergies or rhinitis.      furosemide (LASIX) 20 MG tablet TAKE 1 TO 2 TABLETS BY MOUTH EVERY DAY AS DIRECTED 60 tablet 1   glipiZIDE (GLUCOTROL XL) 5 MG 24 hr tablet TAKE 1 TABLET BY MOUTH EVERYDAY WITH BREAKFAST. **DUE FOR YEARLY PHYSICAL** 30 tablet 2   L-Methylfolate (DEPLIN) 7.5 MG TABS Take 7.5 mg by mouth daily with breakfast.      L-Methylfolate-Algae (DEPLIN 7.5) 7.5-90.314 MG CAPS Take 1 capsule by mouth daily.     Lancets (ONETOUCH DELICA PLUS [id number] MISC USE TO CHECK BLOOD SUGAR 2 TIMES A DAY 100 each 3   levothyroxine (SYNTHROID) 50 MCG  tablet TAKE 1 TABLET BY MOUTH EVERY DAY 90 tablet 0   LYRICA 150 MG capsule Take 150 mg by mouth 3 (three) times daily.   2   metFORMIN (GLUCOPHAGE) 500 MG tablet TAKE 1 TABLET BY MOUTH TWICE A DAY WITH MEALS 60 tablet 2   Misc Natural Products (GLUCOS-CHONDROIT-MSM COMPLEX PO) Take 1 tablet 3 (three) times daily by mouth.      mometasone (ELOCON) 0.1 % cream Apply topically.     montelukast (SINGULAIR) 10 MG tablet TAKE 1 TABLET BY MOUTH EVERYDAY AT BEDTIME 90 tablet 1   Multiple Vitamin  (MULITIVITAMIN WITH MINERALS) TABS Take 1 tablet by mouth daily.       mupirocin cream (BACTROBAN) 2 % Apply once daily to lesions on feet as needed. 30 g 1   NEXIUM 40 MG capsule TAKE 1 CAPSULE BY MOUTH TWICE A DAY 180 capsule 1   nitroGLYCERIN (NITROSTAT) 0.4 MG SL tablet Place 0.4 mg under the tongue every 5 (five) minutes as needed for chest pain.      ondansetron (ZOFRAN) 4 MG tablet TAKE 1 TABLET (4 MG TOTAL) BY MOUTH EVERY 4 (FOUR) HOURS AS NEEDED FOR NAUSEA OR VOMITING. 90 tablet 3   ONETOUCH ULTRA test strip USE TO CHECK BLOOD SUGAR 2 TIMES A DAY 100 strip 0   Oxycodone HCl 20 MG TABS Take 20 mg every 4 (four) hours as needed by mouth (pain).      OXYGEN Inhale 2 L into the lungs at bedtime as needed (shortness of breath).      potassium chloride (KLOR-CON) 10 MEQ tablet TAKE 2 TABLETS BY MOUTH EVERY DAY 180 tablet 0   promethazine (PHENERGAN) 25 MG tablet TAKE 1 TABLET (25 MG TOTAL) BY MOUTH EVERY 6 (SIX) HOURS AS NEEDED FOR NAUSEA OR VOMITING. 90 tablet 1   Spacer/Aero-Holding Chambers DEVI 1 Device by Does not apply route daily as needed. 1 Device 0   sucralfate (CARAFATE) 1 g tablet DISSOLVE 1 TAB IN 1 TABLESPOON OF DISTILLED WATER FOR 15 MINS. TAKE 4XDAILY WITH MEALS & AT BEDTIME 360 tablet 3   tiZANidine (ZANAFLEX) 4 MG tablet Take 4 mg by mouth every 8 (eight) hours as needed for muscle spasms.     vitamin B-12 (CYANOCOBALAMIN) 500 MCG tablet Take 500 mcg by mouth daily.      No current facility-administered medications for this visit.    PHYSICAL EXAMINATION: ECOG PERFORMANCE STATUS: 1 - Symptomatic but completely ambulatory  Vitals:   12/13/20 1519  BP: (!) 118/59  Pulse: 76  Resp: 18  Temp: 97.9 F (36.6 C)  SpO2: 94%   Filed Weights   12/13/20 1519  Weight: 217 lb 9.6 oz (98.7 kg)    BREAST: No palpable masses or nodules in either right or left breasts. No palpable axillary supraclavicular or infraclavicular adenopathy no breast tenderness or nipple  discharge. (exam performed in the presence of a chaperone)  LABORATORY DATA:  I have reviewed the data as listed CMP Latest Ref Rng & Units 09/30/2020 12/08/2019 11/05/2019  Glucose 65 - 99 mg/dL 112(H) 150(H) 165(H)  BUN 7 - 25 mg/dL 12 6(L) 19  Creatinine 0.50 - 0.99 mg/dL 0.67 0.60 0.69  Sodium 135 - 146 mmol/L 142 143 145  Potassium 3.5 - 5.3 mmol/L 4.2 3.7 3.9  Chloride 98 - 110 mmol/L 101 102 106  CO2 20 - 32 mmol/L '29 27 31  ' Calcium 8.6 - 10.4 mg/dL 9.8 9.2 9.3  Total Protein 6.1 - 8.1 g/dL 7.5  6.6 6.7  Total Bilirubin 0.2 - 1.2 mg/dL 0.5 0.3 <0.2(L)  Alkaline Phos 38 - 126 U/L - 63 77  AST 10 - 35 U/L '26 28 29  ' ALT 6 - 29 U/L 25 29 36    Lab Results  Component Value Date   WBC 5.5 09/30/2020   HGB 13.7 09/30/2020   HCT 42.8 09/30/2020   MCV 84.9 09/30/2020   PLT 271 09/30/2020   NEUTROABS 3,564 09/30/2020    ASSESSMENT & PLAN:  Malignant neoplasm of upper-outer quadrant of left breast in female, estrogen receptor negative ([place]) 09/07/2019:Routine screening mammogram detected a 2.1cm mass in the left breast and no left axillary adenopathy. 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  Recommendations: 1. Neoadjuvant chemotherapy with Taxotere/Cytoxanx2starting 09/24/2019(discontinued because of respiratory failure after each chemotherapy) 2. Breast conserving surgery1/05/2020: Microscopic foci of residual IDC grade 3, largest measured 2.5 mm foci of DCIS high-grade, resection margins negative, ER 0%, PR 0%, HER-2 negative, Ki-67 20% on the final path, 0/1 lymph node negative 3. Adjuvant radiation therapy2/09/2020-02/08/2020 --------------------------------------------------------------------------------------------------------------------------------------------- 1.Hospitalization 09/47/0962-83/05/6293: Diastolic CHF and asthma exacerbation with hypoxia (prednisone taper and Lasix) 2.Hospitalization 10/30/2019-11/03/2019: Respiratory  failure  Breast cancer surveillance: 1. 12/13/2020 breast exam: Benign 2. 12/09/2020: Benign breast density category C  Return to clinic in 1 year for follow-up t    No orders of the defined types were placed in this encounter.  The patient has a good understanding of the overall plan. she agrees with it. she will call with any problems that may develop before the next visit here.  Total time spent: 20 mins including face to face time and time spent for planning, charting and coordination of care  Nicholas Lose, MD 12/13/2020  I, Cloyde Reams Dorshimer, am acting as scribe for Dr. Nicholas Lose.  I have reviewed the above documentation for accuracy and completeness, and I agree with the above.

## 2020-12-13 ENCOUNTER — Other Ambulatory Visit: Payer: Self-pay

## 2020-12-13 ENCOUNTER — Inpatient Hospital Stay: Payer: Medicare Other | Attending: Hematology and Oncology | Admitting: Hematology and Oncology

## 2020-12-13 DIAGNOSIS — Z171 Estrogen receptor negative status [ER-]: Secondary | ICD-10-CM | POA: Diagnosis not present

## 2020-12-13 DIAGNOSIS — Z79899 Other long term (current) drug therapy: Secondary | ICD-10-CM | POA: Insufficient documentation

## 2020-12-13 DIAGNOSIS — C50412 Malignant neoplasm of upper-outer quadrant of left female breast: Secondary | ICD-10-CM | POA: Insufficient documentation

## 2020-12-13 DIAGNOSIS — Z9221 Personal history of antineoplastic chemotherapy: Secondary | ICD-10-CM | POA: Insufficient documentation

## 2020-12-13 DIAGNOSIS — Z923 Personal history of irradiation: Secondary | ICD-10-CM | POA: Diagnosis not present

## 2020-12-13 NOTE — Assessment & Plan Note (Signed)
09/07/2019:Routine screening mammogram detected a 2.1cm mass in the left breast and no left axillary adenopathy. 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  Recommendations: 1. Neoadjuvant chemotherapy with Taxotere/Cytoxanx2starting 09/24/2019(discontinued because of respiratory failure after each chemotherapy) 2. Breast conserving surgery1/05/2020: Microscopic foci of residual IDC grade 3, largest measured 2.5 mm foci of DCIS high-grade, resection margins negative, ER 0%, PR 0%, HER-2 negative, Ki-67 20% on the final path, 0/1 lymph node negative 3. Adjuvant radiation therapy2/09/2020-02/08/2020 --------------------------------------------------------------------------------------------------------------------------------------------- 1.Hospitalization 61/44/3154-00/07/6760: Diastolic CHF and asthma exacerbation with hypoxia (prednisone taper and Lasix) 2.Hospitalization 10/30/2019-11/03/2019: Respiratory failure  Breast cancer surveillance: 1. 12/13/2020 breast exam: Benign 2. 12/09/2020: Benign breast density category C  Return to clinic in 1 year for follow-up t

## 2020-12-20 DIAGNOSIS — G4733 Obstructive sleep apnea (adult) (pediatric): Secondary | ICD-10-CM | POA: Diagnosis not present

## 2020-12-29 ENCOUNTER — Other Ambulatory Visit: Payer: Self-pay | Admitting: Internal Medicine

## 2021-01-06 ENCOUNTER — Encounter: Payer: Self-pay | Admitting: Family Medicine

## 2021-01-06 ENCOUNTER — Other Ambulatory Visit: Payer: Self-pay

## 2021-01-06 ENCOUNTER — Ambulatory Visit (INDEPENDENT_AMBULATORY_CARE_PROVIDER_SITE_OTHER): Payer: Medicare Other | Admitting: Family Medicine

## 2021-01-06 VITALS — BP 100/62 | HR 68 | Temp 98.3°F | Ht 66.0 in | Wt 220.5 lb

## 2021-01-06 DIAGNOSIS — E1169 Type 2 diabetes mellitus with other specified complication: Secondary | ICD-10-CM

## 2021-01-06 DIAGNOSIS — E1159 Type 2 diabetes mellitus with other circulatory complications: Secondary | ICD-10-CM | POA: Diagnosis not present

## 2021-01-06 DIAGNOSIS — E119 Type 2 diabetes mellitus without complications: Secondary | ICD-10-CM

## 2021-01-06 DIAGNOSIS — I152 Hypertension secondary to endocrine disorders: Secondary | ICD-10-CM | POA: Diagnosis not present

## 2021-01-06 DIAGNOSIS — E785 Hyperlipidemia, unspecified: Secondary | ICD-10-CM | POA: Diagnosis not present

## 2021-01-06 DIAGNOSIS — J4530 Mild persistent asthma, uncomplicated: Secondary | ICD-10-CM | POA: Diagnosis not present

## 2021-01-06 DIAGNOSIS — K219 Gastro-esophageal reflux disease without esophagitis: Secondary | ICD-10-CM | POA: Diagnosis not present

## 2021-01-06 DIAGNOSIS — L84 Corns and callosities: Secondary | ICD-10-CM | POA: Diagnosis not present

## 2021-01-06 DIAGNOSIS — F339 Major depressive disorder, recurrent, unspecified: Secondary | ICD-10-CM

## 2021-01-06 LAB — POCT GLYCOSYLATED HEMOGLOBIN (HGB A1C): Hemoglobin A1C: 7.2 % — AB (ref 4.0–5.6)

## 2021-01-06 MED ORDER — BUPROPION HCL ER (XL) 150 MG PO TB24: 150.0000 mg | ORAL_TABLET | Freq: Every day | ORAL | 1 refills | Status: DC

## 2021-01-06 NOTE — Patient Instructions (Signed)
Call Dr. Henrene Pastor to get appointment for eval with swallowing/reflux and for colonoscopy.

## 2021-01-06 NOTE — Progress Notes (Signed)
Shelby Anderson DOB: 08-15-1956 Encounter date: 01/06/2021  This is a 65 y.o. female who presents with Chief Complaint  Patient presents with  . Follow-up    History of present illness: Last visit was 09/30/20  Told by Dr. Sharol Given that she has old fractures on ankle - told her she needs surgery, pinning. Still can't go up on tip toes; throbs with walking and she can't go up on toes. Rubbing on lateral maleolus and hurts every time she wears shoe.   Has callous on left foot - she has been trying to soak it with vinegar but pedicure lady told her to stop; was open under and they were worried she would get infection. Did give her ointment to put on this area. Hurts to walk on; peels layers off every once in awhile. Has been there over a year. Corn pads help somewhat; but hard to fit.   Blood sugars - Glipizide 78m, metformin 5023mBID. Running in 120's-140's. Not sure why higher than last time. Eating less. Trying to find gym she can go to. Can walk somewhat - as long as hard surface.   Breathing is doing ok at times. Has sore throat with inhalers - so spacer sent in last visit to help with delivery. Using symbicort. Sore throat doesn't feel as bad as previous. Mild hoarse voice but not as bad. Hasn't used extra inhaler.   HL: still on lipitor - doing well with 4029mDoing well with this.   Misses regular exercise. Issue is finding gym close to her that participates in silver sneakers.   Acid reflux: pepcid, nexium, carafate. Has been worse in last week and a half. Sick on stomach every time she eats something. Taking phenergan at bedtime; zofran during day. Has been eating more crackers to try and help with reflux. More issues with getting choked on food and drink; wondering if time for her to get restretched. Even noting this with water - lasting 3-4 months.   Mood is bad. She gets it together for grandkids. She cooks for them; does things for them.  If she does not have to do anything,  she does not do it.  Activity level has significantly decreased.  She does not find joy and activities and lacks motivation.    Allergies  Allergen Reactions  . Lodine [Etodolac] Anaphylaxis, Hives and Swelling  . Oxycontin [Oxycodone Hcl] Anaphylaxis    hives, trouble breathing, tongue swelling (Only Oxycontin) Tolerates plain oxycodone.  . PMarland Kitchennicillins Anaphylaxis    Told by a surgeon never to take it again. Has patient had a PCN reaction causing immediate rash, facial/tongue/throat swelling, SOB or lightheadedness with hypotension: Yes Has patient had a PCN reaction causing severe rash involving mucus membranes or skin necrosis: Unknown Has patient had a PCN reaction that required hospitalization: No Has patient had a PCN reaction occurring within the last 10 years: No If all of the above answers are "NO", then may proceed with Cephalosporin use.  . Aspirin Other (See Comments)    High-dose caused GI Bleeds  . Darvocet [Propoxyphene N-Acetaminophen] Hives  . Nitroglycerin Other (See Comments)    IV-BP drops dramatically Can take po  . Tramadol Hives and Itching  . Ultram [Tramadol Hcl] Hives  . Valium Other (See Comments)    Circulation problems. "Legs turned black".   Current Meds  Medication Sig  . albuterol (PROVENTIL) (2.5 MG/3ML) 0.083% nebulizer solution Take 3 mLs (2.5 mg total) by nebulization every 6 (six) hours as needed for  wheezing or shortness of breath (J45.40). (Patient taking differently: Take 2.5 mg by nebulization every 6 (six) hours as needed for wheezing or shortness of breath.)  . albuterol (VENTOLIN HFA) 108 (90 Base) MCG/ACT inhaler Inhale 2 puffs into the lungs every 6 (six) hours as needed for wheezing or shortness of breath.  Marland Kitchen aspirin EC 81 MG tablet Take 81 mg at bedtime by mouth.   Marland Kitchen atorvastatin (LIPITOR) 40 MG tablet TAKE 1 TABLET BY MOUTH EVERYDAY AT BEDTIME. **DUE FOR YEARLY PHYSICAL**  . Blood Glucose Monitoring Suppl (ONE TOUCH ULTRA 2) w/Device  KIT USE TO CHECK BLOOD SUGAR 2 TIMES A DAY  . budesonide-formoterol (SYMBICORT) 80-4.5 MCG/ACT inhaler INHALE 2 PUFFS INTO THE LUNGS EVERY MORNING AND ANOTHER 2 PUFFS 12 HOURS LATER  . buPROPion (WELLBUTRIN XL) 150 MG 24 hr tablet Take 1 tablet (150 mg total) by mouth daily.  . Calcium Carbonate-Vitamin D 600-200 MG-UNIT TABS Take 1 tablet by mouth daily.  Marland Kitchen CINNAMON PO Take 1,000 mg 2 (two) times daily by mouth.  . clobetasol cream (TEMOVATE) 0.05 % Apply topically 2 (two) times daily. (Patient taking differently: Apply topically daily as needed (rash).)  . doxepin (SINEQUAN) 10 MG capsule Take 20 mg by mouth at bedtime.   . famotidine (PEPCID) 20 MG tablet TAKE 1 TABLET BY MOUTH 2 TIMES DAILY AS NEEDED FOR HEARTBURN OR INDIGESTION.  . fluticasone (FLONASE) 50 MCG/ACT nasal spray Place 2 sprays at bedtime as needed into both nostrils for allergies or rhinitis.   . furosemide (LASIX) 20 MG tablet TAKE 1 TO 2 TABLETS BY MOUTH EVERY DAY AS DIRECTED  . glipiZIDE (GLUCOTROL XL) 5 MG 24 hr tablet TAKE 1 TABLET BY MOUTH EVERYDAY WITH BREAKFAST. **DUE FOR YEARLY PHYSICAL**  . L-Methylfolate (DEPLIN) 7.5 MG TABS Take 7.5 mg by mouth daily with breakfast.  . L-Methylfolate-Algae (DEPLIN 7.5) 7.5-90.314 MG CAPS Take 1 capsule by mouth daily.  . Lancets (ONETOUCH DELICA PLUS [id number]) MISC USE TO CHECK BLOOD SUGAR 2 TIMES A DAY  . levothyroxine (SYNTHROID) 50 MCG tablet TAKE 1 TABLET BY MOUTH EVERY DAY  . LYRICA 150 MG capsule Take 150 mg by mouth 3 (three) times daily.   . metFORMIN (GLUCOPHAGE) 500 MG tablet TAKE 1 TABLET BY MOUTH TWICE A DAY WITH MEALS  . Misc Natural Products (GLUCOS-CHONDROIT-MSM COMPLEX PO) Take 1 tablet 3 (three) times daily by mouth.   . mometasone (ELOCON) 0.1 % cream Apply topically.  . montelukast (SINGULAIR) 10 MG tablet TAKE 1 TABLET BY MOUTH EVERYDAY AT BEDTIME  . Multiple Vitamin (MULITIVITAMIN WITH MINERALS) TABS Take 1 tablet by mouth daily.  . mupirocin cream (BACTROBAN)  2 % Apply once daily to lesions on feet as needed.  Marland Kitchen NEXIUM 40 MG capsule TAKE 1 CAPSULE BY MOUTH TWICE A DAY  . nitroGLYCERIN (NITROSTAT) 0.4 MG SL tablet Place 0.4 mg under the tongue every 5 (five) minutes as needed for chest pain.   Marland Kitchen ondansetron (ZOFRAN) 4 MG tablet TAKE 1 TABLET (4 MG TOTAL) BY MOUTH EVERY 4 (FOUR) HOURS AS NEEDED FOR NAUSEA OR VOMITING.  Glory Rosebush ULTRA test strip USE TO CHECK BLOOD SUGAR 2 TIMES A DAY  . Oxycodone HCl 20 MG TABS Take 20 mg every 4 (four) hours as needed by mouth (pain).   . OXYGEN Inhale 2 L into the lungs at bedtime as needed (shortness of breath).   . potassium chloride (KLOR-CON) 10 MEQ tablet TAKE 2 TABLETS BY MOUTH EVERY DAY  . promethazine (PHENERGAN) 25  MG tablet TAKE 1 TABLET (25 MG TOTAL) BY MOUTH EVERY 6 (SIX) HOURS AS NEEDED FOR NAUSEA OR VOMITING.  Marland Kitchen Spacer/Aero-Holding Chambers DEVI 1 Device by Does not apply route daily as needed.  . sucralfate (CARAFATE) 1 g tablet DISSOLVE 1 TAB IN 1 TABLESPOON OF DISTILLED WATER FOR 15 MINS. TAKE 4XDAILY WITH MEALS & AT BEDTIME  . tiZANidine (ZANAFLEX) 4 MG tablet Take 4 mg by mouth every 8 (eight) hours as needed for muscle spasms.  . vitamin B-12 (CYANOCOBALAMIN) 500 MCG tablet Take 500 mcg by mouth daily.     Review of Systems  Constitutional: Negative for chills, fatigue and fever.  Respiratory: Negative for cough, chest tightness, shortness of breath and wheezing.   Cardiovascular: Negative for chest pain, palpitations and leg swelling.  Gastrointestinal: Positive for abdominal pain and nausea.       See HPI.  Musculoskeletal: Positive for gait problem.    Objective:  BP 100/62 (BP Location: Right Arm, Patient Position: Sitting, Cuff Size: Large)   Pulse 68   Temp 98.3 F (36.8 C) (Oral)   Ht '5\' 6"'  (1.676 m)   Wt 220 lb 8 oz (100 kg)   BMI 35.59 kg/m   Weight: 220 lb 8 oz (100 kg)   BP Readings from Last 3 Encounters:  01/06/21 100/62  12/13/20 (!) 118/59  09/30/20 128/80   Wt  Readings from Last 3 Encounters:  01/06/21 220 lb 8 oz (100 kg)  12/13/20 217 lb 9.6 oz (98.7 kg)  09/30/20 218 lb 12.8 oz (99.2 kg)    Physical Exam Constitutional:      General: She is not in acute distress.    Appearance: She is well-developed.  Cardiovascular:     Rate and Rhythm: Normal rate and regular rhythm.     Heart sounds: Normal heart sounds. No murmur heard. No friction rub.  Pulmonary:     Effort: Pulmonary effort is normal. No respiratory distress.     Breath sounds: Normal breath sounds. No wheezing or rales.  Musculoskeletal:     Right lower leg: No edema.     Left lower leg: No edema.  Skin:    Comments: Callus under the fifth metatarsal head.   Neurological:     Mental Status: She is alert and oriented to person, place, and time.  Psychiatric:        Behavior: Behavior normal.     Corn paring procedure:  After patient consent was obtained, the left foot was cleansed with alcohol and dermal curette was used to pare down Callus. Patient tolerated this well and had some immediate pain relief. Central corn had dried blood/no active bleeding, erythema, irritation. Stopped paring due to tenderness.   Assessment/Plan  1. Hypertension associated with diabetes ([place]) Has been well controlled.  Not currently on medications (except Lasix) - CBC with Differential/Platelet; Future - Comprehensive metabolic panel; Future - AMB Referral to [facility]  2. Mild persistent asthma without complication Breathing has been stable.  Continue Symbicort, Flonase, Singulair. Hoping pharmacy can assist with cost of medications. - AMB Referral to [place]  3. Gastroesophageal reflux disease without esophagitis Worsening of reflux.  Instructed her to call GI for follow-up.  Additionally having some food sticking and choking with swallowing.  Overdue for colonoscopy and likely needs EGD as well. Med cost is an issue with GI meds; hoping pharmacy can  help.  - AMB Referral to [place]  4. Type 2 diabetes mellitus without complication, without  long-term current use of insulin ([place]) Med cost is a concern for her if any additional medications were needed to help control sugars.  A1c has been stable on Metformin and glipizide.  We will continue to monitor and address lifestyle behaviors.  She felt better when exercising, so would like to help her get back on track with this. - POC HgB A1c - Hemoglobin A1c; Future - Microalbumin / creatinine urine ratio; Future - AMB Referral to [facility]ordin[facility]  5. Hyperlipidemia associated with type 2 diabetes mellitus ([place]) Cholesterol has been well controlled on Lipitor 40 mg daily - Lipid panel; Future - AMB Referral to [place]  6. Corn of foot Lesion was paring down in the office today.  I do not feel that entire central corn.  Encouraged to work on foot and we can reshave at follow-up visit.  She did have some immediate improvement in discomfort.  7. Depression, recurrent ([place]) She has been on doxepin for years.  Depressed mood is worsening.  I think she would benefit from adding Wellbutrin.  And hopefully this will help with motivation and perhaps some appetite control. - buPROPion (WELLBUTRIN XL) 150 MG 24 hr tablet; Take 1 tablet (150 mg total) by mouth daily.  Dispense: 90 tablet; Refill: 1 - AMB Referral to [place]   Return in about 1 month (around 02/03/2021) for physical exam.   45 minutes spent in chart review, time with patient, exam, charting excluding time for procedure.   [name], MD

## 2021-01-09 ENCOUNTER — Telehealth: Payer: Self-pay | Admitting: Family Medicine

## 2021-01-09 NOTE — Telephone Encounter (Signed)
Pt is calling in stating that the pharmacy told her that the bupropion (WELLBUTRIN XL) 150 MG and doxepin (SINEQUAN) 10 MG has a interaction w/each other it may cause pt to have seizures and she would like to know if Dr. Ethlyn Gallery would like for her to continue to get the new Rx (WELLBUTRIN XL).   Pharm:  [facility] [address].  Pt would like to have a call back.

## 2021-01-10 NOTE — Telephone Encounter (Signed)
Either med alone has increased risk of seizure - we consider this most strongly in those with a seizure history as the medications will not necessarily cause, but can lower the threshold for a seizure. I still feel that benefits outweight risks for her. I am happy to talk through details with her a little more though if she would like (if she wants to talk further just message me back and I will call her when I'm back in the office)

## 2021-01-10 NOTE — Telephone Encounter (Signed)
Left a message for the pt to return my call.  

## 2021-01-11 NOTE — Telephone Encounter (Signed)
Left a message for the pt to return my call.  

## 2021-01-12 ENCOUNTER — Other Ambulatory Visit: Payer: Self-pay | Admitting: Family Medicine

## 2021-01-13 NOTE — Telephone Encounter (Signed)
Spoke with the pt and informed her of the message below.  Patient stated she has never had a seizure herself and only has a family history of seizures.  Patient stated she talked with the pharmacist as he told her it was a low dose and she is fine with taking these together.  Patient questioned when she should take Bupropion-morning or night?  Message sent to PCP.

## 2021-01-13 NOTE — Telephone Encounter (Signed)
I would suggest taking the bupropion in the morning since she takes the doxepin at night.

## 2021-01-13 NOTE — Telephone Encounter (Signed)
Patient returned Shelby Anderson's call

## 2021-01-16 NOTE — Telephone Encounter (Signed)
Left a detailed message at the pts cell number with the information below. 

## 2021-01-25 DIAGNOSIS — G894 Chronic pain syndrome: Secondary | ICD-10-CM | POA: Diagnosis not present

## 2021-01-25 DIAGNOSIS — G893 Neoplasm related pain (acute) (chronic): Secondary | ICD-10-CM | POA: Diagnosis not present

## 2021-01-25 DIAGNOSIS — Z79891 Long term (current) use of opiate analgesic: Secondary | ICD-10-CM | POA: Diagnosis not present

## 2021-01-25 DIAGNOSIS — M47816 Spondylosis without myelopathy or radiculopathy, lumbar region: Secondary | ICD-10-CM | POA: Diagnosis not present

## 2021-01-28 NOTE — Progress Notes (Signed)
Primary Physician/Referring:  Wynn Banker, MD  Patient ID: Shelby Anderson, female    DOB: 1956/07/31, 64 y.o.   MRN: [id number]  Chief Complaint  Patient presents with  . Hypertension  . Follow-up  . Dizziness   HPI:    Shelby Anderson  is a 65 y.o. obese Caucasian female with history of NSTEMI with normal coronary arteries, diabetes mellitus, orthostatic hypotension, chronic chest pain syndrome, recurrent syncope status post loop recorder implantation by [facility] cardiology.  She also has history of chronic severe back pain, bronchial asthma and severe restrictive lung disease, severe GERD, distal esophageal stricture status post dilation, peptic ulcer disease, depression.  Patient is a retired Engineer, civil (consulting).  Echocardiogram and February 2018 revealed RV dilation.  She was hospitalized 1020 08/2019-10/04/2019 for acute hypoxic respiratory failure, felt to be secondary acute on chronic diastolic heart failure versus adverse reaction secondary to chemotherapy for breast cancer.  Since hospitalization patient has recuperated well.  She does note that upon discharge her Imdur and Lopressor were discontinued and she was started on furosemide 20 mg daily.  Patient was last seen in our office 01/30/2018 (3 years ago) by Dr. Jacinto Halim for follow-up of cor pulmonale.  At that time she was stable from a cardiovascular standpoint and recommended for as needed follow-up.  Patient now presents to our office to reestablish care as she is concerned her blood pressure has been trending up over the last several months.  Patient continues to have occasional chest pain particularly with exertion which is chronic and stable.  She continues to take nitroglycerin as needed, denies increase in frequency of use.  She also reports occasional episodes of brief dizziness or light headedness upon standing.  Denies palpitations, dyspnea, syncope.  Denies PND, orthopnea, leg edema.  She continues to walk on a regular  basis.  Past Medical History:  Diagnosis Date  . Allergy   . Anemia    "chronic"  . Angina    Prinzmetal angina  . Anxiety and depression   . Arthritis   . Asthma   . Atrial fibrillation (HCC)    h/o "AF w/frequent PVCs"  . Breast cancer (HCC)    Left  . Cancer (HCC)    hx of skin cancer   . CHF (congestive heart failure) (HCC)   . Chronic back pain greater than 3 months duration    on chronic narcotics, treated at pain clinic  . Colon polyps    hyperplastic  . Coronary artery disease    Arrythmia, orthostatic hypotension, HLD, HTN; sees Dr. Jacinto Halim  . Difficult intubation    "TMJ & woke up when they were still cutting on me"  . Dysrhythmia    sees Dr. Jacinto Halim and a cardiologist at [facility]  . Esophageal stricture   . Family history of melanoma ]  . Family history of pancreatic cancer   . Fatty liver   . Fatty liver   . Fibroids   . Fibromyalgia    "in my legs"  . GERD (gastroesophageal reflux disease)    hx hiatal hernia, stricture and gastric ulcer  . Headache(784.0)    migraines  . Heart murmur   . Hiatal hernia   . History of loop recorder   . History of migraines    "dx'd when I was in my teens"  . Hyperlipemia   . Hypertension   . Mental disorder   . Mild episode of recurrent major depressive disorder (HCC) 12/06/2015  . Myocardial infarction (HCC)  1980's & 1990;   sees Dr. Jacinto Halim  . OSA (obstructive sleep apnea)   . Personal history of chemotherapy   . Personal history of radiation therapy   . Pneumonia    multiple times  . PONV (postoperative nausea and vomiting)   . Recurrent upper respiratory infection (URI)   . Shortness of breath 11/20/11   "all the time", sees pulmonlogy, ? asthma  . Stenotic cervical os   . Stomach ulcer    "3 small; found in 05/2011"  . TMJ (dislocation of temporomandibular joint)   . Tuberculosis    + TB SKIN TEST  . Type 2 diabetes mellitus without complication, without long-term current use of insulin (HCC) 12/06/2015    not on meds    Past Surgical History:  Procedure Laterality Date  . ACHILLES TENDON REPAIR  1970's   left ankle  . ARTHROSCOPIC REPAIR ACL     left knee cap  . BREAST BIOPSY Right 04/08/2013   again in October or November 2020  . BREAST LUMPECTOMY WITH RADIOACTIVE SEED AND SENTINEL LYMPH NODE BIOPSY Left 12/09/2019   Procedure: LEFT BREAST LUMPECTOMY WITH RADIOACTIVE SEED AND LEFT AXILLARY SENTINEL LYMPH NODE BIOPSY;  Surgeon: Emelia Loron, MD;  Location: [facility] OR;  Service: General;  Laterality: Left;  PEC BLOCK  . CARDIAC CATHETERIZATION     loop recorder  . CARPAL TUNNEL RELEASE  unknown   left hand  . COLONOSCOPY    . ESOPHAGOGASTRODUODENOSCOPY (EGD) WITH PROPOFOL N/A 10/29/2017   Procedure: ESOPHAGOGASTRODUODENOSCOPY (EGD) WITH PROPOFOL;  Surgeon: Hilarie Fredrickson, MD;  Location: [facility] ENDOSCOPY;  Service: Endoscopy;  Laterality: N/A;  . LOOP RECORDER IMPLANT    . PORT-A-CATH REMOVAL N/A 12/09/2019   Procedure: REMOVAL PORT-A-CATH;  Surgeon: Emelia Loron, MD;  Location[facility];  Service: General;  Laterality: N/A;  . PORTACATH PLACEMENT N/A 09/23/2019   Procedure: INSERTION PORT-A-CATH Right Internal Feliberto Harts;  Surgeon: Emelia Loron, MD;  Location: [facility] OR;  Service: General;  Laterality: N/A;  . post ganglionectomy  1970's   "for migraine headaches"  . pouch string  [id number]   "did this 3 times (once w/each pregnancy)"  . SAVORY DILATION N/A 10/29/2017   Procedure: SAVORY DILATION;  Surgeon: Hilarie Fredrickson, MD;  Location: [facility] ENDOSCOPY;  Service: Endoscopy;  Laterality: N/A;  . TOTAL KNEE ARTHROPLASTY Left 09/25/2016   Procedure: LEFT TOTAL KNEE ARTHROPLASTY;  Surgeon: Durene Romans, MD;  Location: [facility] ORS;  Service: Orthopedics;  Laterality: Left;  . TUBAL LIGATION  1980's   Family History  Problem Relation Age of Onset  . Malignant hyperthermia Father   . Hypertension Father   . Heart disease Father   . Diabetes Father   . Cancer Father        skin  .  Hypertension Mother   . Heart disease Mother   . Multiple myeloma Mother   . Cancer Sister        CERVICAL  . Hypertension Sister   . Cancer Brother 22       MELANOMA  . Heart disease Maternal Grandmother   . Other Maternal Grandmother 32       complications of childbirth  . Heart disease Maternal Grandfather   . Cancer Paternal Grandmother        ?   Marland Kitchen Heart disease Paternal Grandmother   . Heart disease Paternal Grandfather   . Cancer Brother        LUNG  . Diabetes Sister   . Hypertension Sister   .  Heart disease Sister   . Cancer Sister   . Cancer Brother   . Pancreatic cancer Niece 66  . Cancer Nephew 40       unknown- currently in the [employer]  . Anesthesia problems Neg Hx   . Hypotension Neg Hx   . Pseudochol deficiency Neg Hx   . Colon cancer Neg Hx   . Esophageal cancer Neg Hx   . Rectal cancer Neg Hx   . Stomach cancer Neg Hx   . Breast cancer Neg Hx     Social History   Tobacco Use  . Smoking status: Never Smoker  . Smokeless tobacco: Never Used  Substance Use Topics  . Alcohol use: No    Alcohol/week: 0.0 standard drinks   Marital Status: Married   ROS  Review of Systems  Constitutional: Negative for malaise/fatigue and weight gain.  Cardiovascular: Positive for chest pain (stable, chronic). Negative for claudication, leg swelling, near-syncope, orthopnea, palpitations, paroxysmal nocturnal dyspnea and syncope.  Respiratory: Negative for shortness of breath.   Hematologic/Lymphatic: Does not bruise/bleed easily.  Gastrointestinal: Negative for melena.  Neurological: Positive for dizziness (orthostatic). Negative for weakness.    Objective  Blood pressure 128/88, pulse 92, temperature 98.7 F (37.1 C), resp. rate 17, height 5\' 6"  (1.676 m), weight 216 lb (98 kg), SpO2 95 %.  Vitals with BMI 01/30/2021 01/06/2021 12/13/2020  Height 5\' 6"  5\' 6"  5\' 6"   Weight 216 lbs 220 lbs 8 oz 217 lbs 10 oz  BMI 34.88 35.61 35.14  Systolic 128 100 161  Diastolic  88 62 59  Pulse 92 68 76    Orthostatic VS for the past 72 hrs (Last 3 readings):  Orthostatic BP Patient Position BP Location Cuff Size Orthostatic Pulse  01/30/21 1308 112/72 Standing Right Arm Large 93  01/30/21 1307 119/71 Sitting Right Arm Large 84  01/30/21 1306 119/70 Supine Right Arm Large 77     Physical Exam Vitals reviewed.  Constitutional:      Appearance: She is obese.  HENT:     Head: Normocephalic and atraumatic.  Cardiovascular:     Rate and Rhythm: Normal rate and regular rhythm.     Pulses: Intact distal pulses.          Carotid pulses are 2+ on the right side and 2+ on the left side.      Radial pulses are 2+ on the right side and 2+ on the left side.       Dorsalis pedis pulses are 2+ on the right side and 2+ on the left side.       Posterior tibial pulses are 1+ on the right side and 1+ on the left side.     Heart sounds: S1 normal and S2 normal. No murmur heard. No gallop.      Comments: Femoral and popliteal pulses difficult to evaluate due to patient body habitus. Pulmonary:     Effort: Pulmonary effort is normal. No respiratory distress.     Breath sounds: No wheezing, rhonchi or rales.  Musculoskeletal:     Right lower leg: No edema.     Left lower leg: No edema.  Skin:    General: Skin is warm and dry.     Capillary Refill: Capillary refill takes less than 2 seconds.  Neurological:     Mental Status: She is alert.     Laboratory examination:   Recent Labs    09/30/20 1533  NA 142  K 4.2  CL 101  CO2 29  GLUCOSE 112*  BUN 12  CREATININE 0.67  CALCIUM 9.8   CrCl cannot be calculated (Patient's most recent lab result is older than the maximum 21 days allowed.).  CMP Latest Ref Rng & Units 09/30/2020 12/08/2019 11/05/2019  Glucose 65 - 99 mg/dL 161(W) 960(A) 540(J)  BUN 7 - 25 mg/dL 12 6(L) 19  Creatinine 0.50 - 0.99 mg/dL 8.11 9.14 7.82  Sodium 135 - 146 mmol/L 142 143 145  Potassium 3.5 - 5.3 mmol/L 4.2 3.7 3.9  Chloride 98 - 110  mmol/L 101 102 106  CO2 20 - 32 mmol/L 29 27 31   Calcium 8.6 - 10.4 mg/dL 9.8 9.2 9.3  Total Protein 6.1 - 8.1 g/dL 7.5 6.6 6.7  Total Bilirubin 0.2 - 1.2 mg/dL 0.5 0.3 <9.5(A)  Alkaline Phos 38 - 126 U/L - 63 77  AST 10 - 35 U/L 26 28 29   ALT 6 - 29 U/L 25 29 36   CBC Latest Ref Rng & Units 09/30/2020 12/08/2019 11/05/2019  WBC 3.8 - 10.8 Thousand/uL 5.5 5.7 15.7(H)  Hemoglobin 11.7 - 15.5 g/dL 21.3 08.6 11.3(L)  Hematocrit 35.0 - 45.0 % 42.8 40.9 35.9(L)  Platelets 140 - 400 Thousand/uL 271 265 407(H)    Lipid Panel Recent Labs    09/30/20 1533  CHOL 141  TRIG 226*  LDLCALC 73  HDL 37*  CHOLHDL 3.8    HEMOGLOBIN A1C Lab Results  Component Value Date   HGBA1C 7.2 (A) 01/06/2021   MPG 160 09/30/2020   TSH Recent Labs    09/30/20 1533 12/07/20 1433  TSH 6.50* 2.99    External labs:   None   Medications and allergies   Allergies  Allergen Reactions  . Lodine [Etodolac] Anaphylaxis, Hives and Swelling  . Oxycontin [Oxycodone Hcl] Anaphylaxis    hives, trouble breathing, tongue swelling (Only Oxycontin) Tolerates plain oxycodone.  Marland Kitchen Penicillins Anaphylaxis    Told by a surgeon never to take it again. Has patient had a PCN reaction causing immediate rash, facial/tongue/throat swelling, SOB or lightheadedness with hypotension: Yes Has patient had a PCN reaction causing severe rash involving mucus membranes or skin necrosis: Unknown Has patient had a PCN reaction that required hospitalization: No Has patient had a PCN reaction occurring within the last 10 years: No If all of the above answers are "NO", then may proceed with Cephalosporin use.  . Aspirin Other (See Comments)    High-dose caused GI Bleeds  . Darvocet [Propoxyphene N-Acetaminophen] Hives  . Nitroglycerin Other (See Comments)    IV-BP drops dramatically Can take po  . Tramadol Hives and Itching  . Ultram [Tramadol Hcl] Hives  . Valium Other (See Comments)    Circulation problems. "Legs turned  black".     Outpatient Medications Prior to Visit  Medication Sig Dispense Refill  . albuterol (PROVENTIL) (2.5 MG/3ML) 0.083% nebulizer solution Take 3 mLs (2.5 mg total) by nebulization every 6 (six) hours as needed for wheezing or shortness of breath (J45.40). (Patient taking differently: Take 2.5 mg by nebulization every 6 (six) hours as needed for wheezing or shortness of breath.) 75 mL 3  . albuterol (VENTOLIN HFA) 108 (90 Base) MCG/ACT inhaler Inhale 2 puffs into the lungs every 6 (six) hours as needed for wheezing or shortness of breath. 3 g 3  . aspirin EC 81 MG tablet Take 81 mg at bedtime by mouth.     Marland Kitchen atorvastatin (LIPITOR) 40 MG tablet TAKE 1 TABLET BY MOUTH EVERYDAY AT BEDTIME. **DUE  FOR YEARLY PHYSICAL** 30 tablet 5  . Blood Glucose Monitoring Suppl (ONE TOUCH ULTRA 2) w/Device KIT USE TO CHECK BLOOD SUGAR 2 TIMES A DAY 1 kit 0  . budesonide-formoterol (SYMBICORT) 80-4.5 MCG/ACT inhaler INHALE 2 PUFFS INTO THE LUNGS EVERY MORNING AND ANOTHER 2 PUFFS 12 HOURS LATER 30.6 Inhaler 1  . buPROPion (WELLBUTRIN XL) 150 MG 24 hr tablet Take 1 tablet (150 mg total) by mouth daily. 90 tablet 1  . Calcium Carbonate-Vitamin D 600-200 MG-UNIT TABS Take 1 tablet by mouth daily.    Marland Kitchen CINNAMON PO Take 1,000 mg 2 (two) times daily by mouth.    . clobetasol cream (TEMOVATE) 0.05 % Apply topically 2 (two) times daily. (Patient taking differently: Apply topically daily as needed (rash).) 30 g 0  . doxepin (SINEQUAN) 10 MG capsule Take 20 mg by mouth at bedtime.     . famotidine (PEPCID) 20 MG tablet TAKE 1 TABLET BY MOUTH 2 TIMES DAILY AS NEEDED FOR HEARTBURN OR INDIGESTION. 180 tablet 1  . fluticasone (FLONASE) 50 MCG/ACT nasal spray Place 2 sprays at bedtime as needed into both nostrils for allergies or rhinitis.     Marland Kitchen glipiZIDE (GLUCOTROL XL) 5 MG 24 hr tablet TAKE 1 TABLET BY MOUTH EVERYDAY WITH BREAKFAST. **DUE FOR YEARLY PHYSICAL** 30 tablet 2  . L-Methylfolate (DEPLIN) 7.5 MG TABS Take 7.5 mg  by mouth daily with breakfast.    . Lancets (ONETOUCH DELICA PLUS LANCET30G) MISC USE TO CHECK BLOOD SUGAR 2 TIMES A DAY 100 each 3  . levothyroxine (SYNTHROID) 50 MCG tablet TAKE 1 TABLET BY MOUTH EVERY DAY 90 tablet 0  . LYRICA 150 MG capsule Take 150 mg by mouth 3 (three) times daily.   2  . metFORMIN (GLUCOPHAGE) 500 MG tablet TAKE 1 TABLET BY MOUTH TWICE A DAY WITH MEALS 60 tablet 2  . Misc Natural Products (GLUCOS-CHONDROIT-MSM COMPLEX PO) Take 1 tablet 3 (three) times daily by mouth.     . mometasone (ELOCON) 0.1 % cream Apply topically.    . montelukast (SINGULAIR) 10 MG tablet TAKE 1 TABLET BY MOUTH EVERYDAY AT BEDTIME 90 tablet 1  . Multiple Vitamin (MULITIVITAMIN WITH MINERALS) TABS Take 1 tablet by mouth daily.    . mupirocin cream (BACTROBAN) 2 % Apply once daily to lesions on feet as needed. 30 g 1  . NEXIUM 40 MG capsule TAKE 1 CAPSULE BY MOUTH TWICE A DAY 180 capsule 1  . nitroGLYCERIN (NITROSTAT) 0.4 MG SL tablet Place 0.4 mg under the tongue every 5 (five) minutes as needed for chest pain.     Marland Kitchen ondansetron (ZOFRAN) 4 MG tablet TAKE 1 TABLET (4 MG TOTAL) BY MOUTH EVERY 4 (FOUR) HOURS AS NEEDED FOR NAUSEA OR VOMITING. 90 tablet 3  . ONETOUCH ULTRA test strip USE TO CHECK BLOOD SUGAR 2 TIMES A DAY 100 strip 0  . Oxycodone HCl 20 MG TABS Take 20 mg every 4 (four) hours as needed by mouth (pain).     . potassium chloride (KLOR-CON) 10 MEQ tablet TAKE 2 TABLETS BY MOUTH EVERY DAY 180 tablet 0  . promethazine (PHENERGAN) 25 MG tablet TAKE 1 TABLET (25 MG TOTAL) BY MOUTH EVERY 6 (SIX) HOURS AS NEEDED FOR NAUSEA OR VOMITING. 90 tablet 1  . Spacer/Aero-Holding Chambers DEVI 1 Device by Does not apply route daily as needed. 1 Device 0  . sucralfate (CARAFATE) 1 g tablet DISSOLVE 1 TAB IN 1 TABLESPOON OF DISTILLED WATER FOR 15 MINS. TAKE 4XDAILY WITH MEALS &  AT BEDTIME 360 tablet 3  . tiZANidine (ZANAFLEX) 4 MG tablet Take 4 mg by mouth every 8 (eight) hours as needed for muscle spasms.     . vitamin B-12 (CYANOCOBALAMIN) 500 MCG tablet Take 500 mcg by mouth daily.     . furosemide (LASIX) 20 MG tablet TAKE 1 TO 2 TABLETS BY MOUTH EVERY DAY AS DIRECTED 60 tablet 1  . L-Methylfolate-Algae (DEPLIN 7.5) 7.5-90.314 MG CAPS Take 1 capsule by mouth daily.    . OXYGEN Inhale 2 L into the lungs at bedtime as needed (shortness of breath).      No facility-administered medications prior to visit.     Radiology:   No results found.  Cardiac Studies:  Echocardiogram 10/01/2019: 1. Left ventricular ejection fraction, by visual estimation, is 60 to  65%. The left ventricle has normal function. There is no left ventricular  hypertrophy.  2. The left ventricle has no regional wall motion abnormalities.  3. Global right ventricle has normal systolic function.The right  ventricular size is mildly enlarged. No increase in right ventricular wall  thickness.  4. Left atrial size was normal.  5. Right atrial size was normal.  6. Presence of pericardial fat pad.  7. Mild to moderate mitral annular calcification.  8. The mitral valve is degenerative. No evidence of mitral valve  regurgitation. No evidence of mitral stenosis.  9. The tricuspid valve is grossly normal. Tricuspid valve regurgitation  is trivial.  10. The aortic valve is tricuspid. Aortic valve regurgitation is not  visualized. No evidence of aortic valve sclerosis or stenosis.  11. There is Mild calcification of the aortic valve.  12. There is Mild thickening of the aortic valve.  13. The pulmonic valve was grossly normal. Pulmonic valve regurgitation is  not visualized.  14. Mild plaque invoving the ascending aorta.  15. Normal pulmonary artery systolic pressure.  16. The tricuspid regurgitant velocity is 1.94 m/s, and with an assumed  right atrial pressure of 15 mmHg, the estimated right ventricular systolic  pressure is normal at 30.1 mmHg.  17. The inferior vena cava is dilated in size with <50% respiratory   variability, suggesting right atrial pressure of 15 mmHg.   Echocardiogram 07/16/2017: Left ventricle cavity is normal in size.  Normal global wall motion.  Calculated EF 62%. Moderate biatrial enlargement Moderate RV enlargement with preserved systolic function No significant change compared to prior study 01/2017.  Assessment of pulmonary systolic pressure is again limited due to inadequate TR jet visualization  Heart catheterization 04/07/2010: Normal coronary arteries, LVEF 65%  EKG:   EKG 01/22/2021: Sinus rhythm at a rate of 86 bpm.  Left axis, left anterior fascicular block.  Poor progression, cannot exclude anteroseptal infarct old.  Nonspecific T wave abnormality.  Low voltage complexes, consider pulmonary disease pattern.  Compared to EKG 01/30/2018, no significant change.  EKG 01/30/2018: Normal sinus rhythm at a rate of 65 bpm, left axis deviation, left anterior fascicular block.  Poor progression, cannot exclude anteroseptal infarct old.  Nonspecific T abnormality.  No significant change from 07/31/2017.  Assessment     ICD-10-CM   1. Essential hypertension  I10 EKG 12-Lead  2. Orthostatic hypotension  I95.1   3. Type 2 diabetes mellitus without complication, without long-term current use of insulin (HCC)  E11.9      Medications Discontinued During This Encounter  Medication Reason  . L-Methylfolate-Algae (DEPLIN 7.5) 7.5-90.314 MG CAPS Error  . OXYGEN Error  . furosemide (LASIX) 20 MG tablet Dose change  Meds ordered this encounter  Medications  . furosemide (LASIX) 20 MG tablet    Sig: Take 1 tablet (20 mg total) by mouth daily as needed for fluid.    Dispense:  90 tablet    Refill:  3  . metoprolol succinate (TOPROL-XL) 25 MG 24 hr tablet    Sig: Take 1 tablet (25 mg total) by mouth daily. Take with or immediately following a meal.    Dispense:  90 tablet    Refill:  3    Recommendations:   Shelby Anderson is a 65 y.o. obese Caucasian female with history  of NSTEMI with normal coronary arteries, diabetes mellitus, orthostatic hypotension, chronic chest pain syndrome, recurrent syncope status post loop recorder implantation by [facility] cardiology.  She also has history of chronic severe back pain, bronchial asthma and severe restrictive lung disease, severe GERD, distal esophageal stricture status post dilation, peptic ulcer disease, depression.  Patient is a retired Engineer, civil (consulting).  Patient was last seen in our office by Dr. Jacinto Halim in 2019, she now presents with concerns that her blood pressure is elevated.  Patient is presently without clinical signs of heart failure.  Her EKG remained stable without concerning ischemic changes compared to previous EKG in 2019.  I personally reviewed external labs, lipids are well controlled.  In regard to hypertension, patient's blood pressure in the office is well controlled however she reports home blood pressure readings that are elevated above goal, >130/80 mmHg.  Patient has previously tolerated metoprolol well, which also improved her chronic chest pain symptoms.  Patient does have a history of asthma, however she is not presently wheezing, therefore will restart patient on metoprolol succinate 25 mg once daily.  Advised patient to notify our office of adverse effects or changes in her clinical status from an asthma standpoint, she verbalized understanding and agreement.  Patient will continue to monitor her blood pressure on a daily basis and bring a log with her to her next visit.  In regard to chronic chest pain, symptoms are unchanged from last evaluation in 2019.  I have refilled her nitroglycerin as patient reports her supply at home had expired.  Follow-up in 6 weeks, sooner if needed, for hypertension, chronic chest pain syndrome.   Rayford Halsted, PA-C 01/30/2021, 5:02 PM Office: [phone]

## 2021-01-30 ENCOUNTER — Ambulatory Visit: Payer: Medicare Other | Admitting: Student

## 2021-01-30 ENCOUNTER — Other Ambulatory Visit: Payer: Self-pay

## 2021-01-30 ENCOUNTER — Encounter: Payer: Self-pay | Admitting: Student

## 2021-01-30 VITALS — BP 128/88 | HR 92 | Temp 98.7°F | Resp 17 | Ht 66.0 in | Wt 216.0 lb

## 2021-01-30 DIAGNOSIS — I951 Orthostatic hypotension: Secondary | ICD-10-CM

## 2021-01-30 DIAGNOSIS — E119 Type 2 diabetes mellitus without complications: Secondary | ICD-10-CM | POA: Diagnosis not present

## 2021-01-30 DIAGNOSIS — I10 Essential (primary) hypertension: Secondary | ICD-10-CM | POA: Diagnosis not present

## 2021-01-30 MED ORDER — FUROSEMIDE 20 MG PO TABS: 20.0000 mg | ORAL_TABLET | Freq: Every day | ORAL | 3 refills | Status: DC | PRN

## 2021-01-30 MED ORDER — METOPROLOL SUCCINATE ER 25 MG PO TB24: 25.0000 mg | ORAL_TABLET | Freq: Every day | ORAL | 3 refills | Status: DC

## 2021-02-01 ENCOUNTER — Encounter: Payer: Self-pay | Admitting: Pulmonary Disease

## 2021-02-01 ENCOUNTER — Telehealth: Payer: Self-pay | Admitting: Family Medicine

## 2021-02-01 ENCOUNTER — Other Ambulatory Visit: Payer: Self-pay

## 2021-02-01 ENCOUNTER — Ambulatory Visit: Payer: Medicare Other | Admitting: Pulmonary Disease

## 2021-02-01 DIAGNOSIS — G4733 Obstructive sleep apnea (adult) (pediatric): Secondary | ICD-10-CM | POA: Diagnosis not present

## 2021-02-01 DIAGNOSIS — J4530 Mild persistent asthma, uncomplicated: Secondary | ICD-10-CM

## 2021-02-01 NOTE — Assessment & Plan Note (Signed)
CPAP download was reviewed which shows excellent control of events on 10 cm, good usage more than 6 hours every night and minimal leak. CPAP is certainly helped improve her daytime somnolence and fatigue. I congratulated her on weight loss of 15 pounds, if she loses another 15 then will consider repeating home sleep test  Weight loss encouraged, compliance with goal of at least 4-6 hrs every night is the expectation. Advised against medications with sedative side effects Cautioned against driving when sleepy - understanding that sleepiness will vary on a day to day basis

## 2021-02-01 NOTE — Progress Notes (Signed)
  Chronic Care Management   Note  02/01/2021 Name: TYRINA HINES MRN: [id number] DOB: Jun 13, 1956  [name] is a 66 y.o. year old female who is a primary care patient of Koberlein, Steele Berg, MD. I reached out to [employer] by phone today in response to a referral sent by Ms. [name]'s PCP, Caren Macadam, MD.   Ms. Miu was given information about Chronic Care Management services today including:  1. CCM service includes personalized support from designated clinical staff supervised by her physician, including individualized plan of care and coordination with other care providers 2. 24/7 contact phone numbers for assistance for urgent and routine care needs. 3. Service will only be billed when office clinical staff spend 20 minutes or more in a month to coordinate care. 4. Only one practitioner may furnish and bill the service in a calendar month. 5. The patient may stop CCM services at any time (effective at the end of the month) by phone call to the office staff.   Patient agreed to services and verbal consent obtained.   Follow up plan:   Carley Perdue [employer] Scheduler

## 2021-02-01 NOTE — Assessment & Plan Note (Signed)
Has done well with asthma controlled on Symbicort 80.  We will continue this level for another year and if she does not have any flareups, then we will consider changing Symbicort to an as-needed basis

## 2021-02-01 NOTE — Patient Instructions (Signed)
  Stay on symbicort CPAP is working well

## 2021-02-01 NOTE — Progress Notes (Signed)
   Subjective:    Patient ID: Shelby Anderson, female    DOB: 1956/06/28, 65 y.o.   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  Drug-induced pneumonitis She reports asthma since childhood which was only mild intermittent through adult life requiring prn SABA. 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 Taxotere  , lumpectomy & radiation  18-month follow-up, breathing is doing well, no nocturnal cough or wheezing. Chest x-ray 10/2019 reviewed clear no infiltrates. She has lost 15 pounds from 232 to 215 pounds ComPliant with Symbicort, she has not needed rescue inhaler much.  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 LVEF 60 to 65% no left ventricular hypertrophy, RV size was mildly enlarged  Spirometry 12/2011 - no airway obstruction -No reversibility with bronchodilator , mod restriction Spirometry8/2019FVC 1.64 (46%), FEV1 1.34 (49%), ratio 82 (104%)  07/2018 FENO 11  11/2015 ONO >> Started on oxygen 2l/m .  10/2016 CT angio neg PE  CT sinus 01/2017 >> clear  EGD 03/2011 (Buccini) - peptic ulcer disease.  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 Also denies presyncope, palpitations, heartburn, abdominal pain, nausea, vomiting, diarrhea or change in bowel or urinary habits, dysuria,hematuria, rash, arthralgias, visual complaints, headache, numbness weakness or ataxia.     Objective:   Physical Exam  Gen. Pleasant, well-nourished, in no distress ENT - no thrush, no pallor/icterus,no post nasal drip Neck: No JVD, no thyromegaly, no carotid bruits Lungs: no use of accessory muscles, no dullness to percussion, clear without rales or rhonchi  Cardiovascular: Rhythm regular, heart sounds  normal, no murmurs or gallops, no peripheral edema Musculoskeletal: No deformities, no cyanosis or clubbing        Assessment & Plan:

## 2021-02-05 ENCOUNTER — Other Ambulatory Visit: Payer: Self-pay | Admitting: Family Medicine

## 2021-02-06 ENCOUNTER — Ambulatory Visit: Payer: Medicare Other | Admitting: Podiatry

## 2021-02-06 ENCOUNTER — Other Ambulatory Visit: Payer: Self-pay

## 2021-02-06 DIAGNOSIS — E1151 Type 2 diabetes mellitus with diabetic peripheral angiopathy without gangrene: Secondary | ICD-10-CM

## 2021-02-06 DIAGNOSIS — B351 Tinea unguium: Secondary | ICD-10-CM | POA: Diagnosis not present

## 2021-02-06 DIAGNOSIS — L600 Ingrowing nail: Secondary | ICD-10-CM

## 2021-02-09 ENCOUNTER — Other Ambulatory Visit: Payer: Self-pay | Admitting: Family Medicine

## 2021-02-12 ENCOUNTER — Encounter: Payer: Self-pay | Admitting: Podiatry

## 2021-02-12 NOTE — Progress Notes (Signed)
Subjective: Shelby Anderson presents today for follow up of preventative diabetic foot care with and painful mycotic nails b/l that are difficult to trim. Pain interferes with ambulation. Aggravating factors include wearing enclosed shoe gear. Pain is relieved with periodic professional debridement.   Right great toe is tender on today's visit.  PCP is Dr. [name] and last visit was 01/06/2021.  Allergies  Allergen Reactions  . Lodine [Etodolac] Anaphylaxis, Hives and Swelling  . Oxycontin [Oxycodone Hcl] Anaphylaxis    hives, trouble breathing, tongue swelling (Only Oxycontin) Tolerates plain oxycodone.  Marland Kitchen Penicillins Anaphylaxis    Told by a surgeon never to take it again. Has patient had a PCN reaction causing immediate rash, facial/tongue/throat swelling, SOB or lightheadedness with hypotension: Yes Has patient had a PCN reaction causing severe rash involving mucus membranes or skin necrosis: Unknown Has patient had a PCN reaction that required hospitalization: No Has patient had a PCN reaction occurring within the last 10 years: No If all of the above answers are "NO", then may proceed with Cephalosporin use.  . Aspirin Other (See Comments)    High-dose caused GI Bleeds  . Darvocet [Propoxyphene N-Acetaminophen] Hives  . Nitroglycerin Other (See Comments)    IV-BP drops dramatically Can take po  . Tramadol Hives and Itching  . Ultram [Tramadol Hcl] Hives  . Valium Other (See Comments)    Circulation problems. "Legs turned black".     Objective: There were no vitals filed for this visit.  Vascular Examination:  Capillary refill time to digits immediate b/l, faintly palpable DP pulses b/l, non-palpable PT pulses b/l, pedal hair absent b/l and skin temperature gradient within normal limits b/l  Dermatological Examination: Pedal skin with normal turgor, texture and tone bilaterally, no open wounds bilaterally, no interdigital macerations bilaterally and toenails  1-5 b/l elongated, dystrophic, thickened, crumbly with subungual debris.  Bilateral hallux nailplates incurvated b/l great toes. No drainage nor cellulitis noted. Incurvated nailplate left great toe b/l border(s) with tenderness to palpation. No erythema, no edema, no drainage noted.  Musculoskeletal: Normal muscle strength 5/5 to all lower extremity muscle groups bilaterally, no gross bony deformities bilaterally and no pain crepitus or joint limitation noted with ROM b/l  Neurological: Protective sensation intact 5/5 intact bilaterally with 10g monofilament b/l  Assessment: 1. Onychomycosis   2. Ingrown toenail without infection   3. Diabetes mellitus type 2 with peripheral artery disease (HCC)    Plan: -Continue diabetic foot care principles. Literature dispensed on today.  -Bilateral great toe nail plates debrided and curretaged  at medial and lateral nail borders. Both digits cleansed. Antibiotic ointment applied. She was advised to apply Vaseline Petroleum Jelly or Neosporin to skin around toenails daily. -Toenails 2, 4, 5 right and 1-5 left were debrided in length and girth without iatrogenic bleeding. -Patient to continue soft, supportive shoe gear daily. -Patient to report any pedal injuries to medical professional immediately. -Patient/POA to call should there be question/concern in the interim.  Return in about 3 months (around 05/09/2021).

## 2021-02-23 ENCOUNTER — Other Ambulatory Visit: Payer: Self-pay

## 2021-02-24 ENCOUNTER — Other Ambulatory Visit: Payer: Self-pay | Admitting: Internal Medicine

## 2021-02-24 ENCOUNTER — Encounter: Payer: Self-pay | Admitting: Family Medicine

## 2021-02-24 ENCOUNTER — Ambulatory Visit (INDEPENDENT_AMBULATORY_CARE_PROVIDER_SITE_OTHER): Payer: Medicare Other | Admitting: Family Medicine

## 2021-02-24 VITALS — BP 112/80 | HR 68 | Temp 98.0°F | Ht 64.75 in | Wt 219.6 lb

## 2021-02-24 DIAGNOSIS — R42 Dizziness and giddiness: Secondary | ICD-10-CM

## 2021-02-24 DIAGNOSIS — I152 Hypertension secondary to endocrine disorders: Secondary | ICD-10-CM

## 2021-02-24 DIAGNOSIS — M5030 Other cervical disc degeneration, unspecified cervical region: Secondary | ICD-10-CM

## 2021-02-24 DIAGNOSIS — Z0000 Encounter for general adult medical examination without abnormal findings: Secondary | ICD-10-CM | POA: Diagnosis not present

## 2021-02-24 DIAGNOSIS — E785 Hyperlipidemia, unspecified: Secondary | ICD-10-CM

## 2021-02-24 DIAGNOSIS — J4530 Mild persistent asthma, uncomplicated: Secondary | ICD-10-CM

## 2021-02-24 DIAGNOSIS — E1169 Type 2 diabetes mellitus with other specified complication: Secondary | ICD-10-CM

## 2021-02-24 DIAGNOSIS — D251 Intramural leiomyoma of uterus: Secondary | ICD-10-CM

## 2021-02-24 DIAGNOSIS — Z23 Encounter for immunization: Secondary | ICD-10-CM | POA: Diagnosis not present

## 2021-02-24 DIAGNOSIS — E1159 Type 2 diabetes mellitus with other circulatory complications: Secondary | ICD-10-CM | POA: Diagnosis not present

## 2021-02-24 DIAGNOSIS — F321 Major depressive disorder, single episode, moderate: Secondary | ICD-10-CM

## 2021-02-24 DIAGNOSIS — M542 Cervicalgia: Secondary | ICD-10-CM | POA: Diagnosis not present

## 2021-02-24 DIAGNOSIS — K219 Gastro-esophageal reflux disease without esophagitis: Secondary | ICD-10-CM

## 2021-02-24 MED ORDER — SHINGRIX 50 MCG/0.5ML IM SUSR: 0.5000 mL | Freq: Once | INTRAMUSCULAR | 0 refills | Status: AC

## 2021-02-24 NOTE — Patient Instructions (Signed)
Can follow up with Dr. Dellis Filbert - gynecology.

## 2021-02-24 NOTE — Progress Notes (Signed)
Shelby Anderson DOB: 1956/04/25 Encounter date: 02/24/2021  This is a 65 y.o. female who presents for complete physical   History of present illness/Additional concerns:  Last visit was 01/06/2021.  She was having issues with acid reflux being quite severe at that time and even causing nausea.  Advised to call GI for follow-up.  Likely needs EGD and is due for colonoscopy.  She fell down stairs in past (end of last year- hit head and neck on back of sink and cabinet; landed in flexed position); now notes that when she flexes neck or bends over for any prolonged time gets pain above shoulder blades to mid head. Right in center. Feels like big knot, weight. If she lifts head up and keeps it that way it will go away. Makes her dizzy with standing up; just throws off all of balance. Turning head side to side doesn't bother her. Lying on side at night - hears more popping/crunching in neck on left. Popping/crunching left neck at night. Gets some room spinning when this happens. Once she is straight up then knot goes away in about 2 minutes. Nausea and dizziness lasts 15 min. Pain, heaviness comes even with doing something like folding laundry. 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 She had MRI brain w and wo contrast 02/2020 (for follow up also for sequelae of fall 6 mo earlier) -  IMPRESSION: 1. No acute intracranial abnormality or evidence of metastatic disease. 2. Progressive, moderate chronic small vessel ischemic disease. 3. 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   Hypertension: Recently started on Toprol 25 mg daily. Asthma: Symbicort, Flonase, Singulair Diabetes 2: Metformin, glipizide.  We discussed getting back on track with regular exercise. Hyperlipidemia: Lipitor 40 mg At last visit, we  discussed depression.  We added Wellbutrin 150 mg daily. Feels calmer, less stressed out than she did before. Just felt like it was one thing after another. Feels she has handled stressors better.   She is on call back list for GI; but otherwise has appointment for summer.    Past Medical History:  Diagnosis Date  . Allergy   . Anemia    "chronic"  . Angina    Prinzmetal angina  . Anxiety and depression   . Arthritis   . Asthma   . Atrial fibrillation ([place])    h/o "AF w/frequent PVCs"  . Breast cancer ([place])    Left  . Cancer (HCC)    hx of skin cancer   . CHF (congestive heart failure) ([place])   . Chronic back pain greater than 3 months duration    on chronic narcotics, treated at pain clinic  . Chronic respiratory failure ([place]) 09/15/2015   ONO 09/04/15 + desats >begin O2 at 2l/ m   . Colon polyps    hyperplastic  . Coronary artery disease    Arrythmia, orthostatic hypotension, HLD, HTN; sees Dr. Einar Gip  . Difficult intubation    "TMJ & woke up when they were still cutting on me"  . Dysrhythmia    sees Dr. Einar Gip and a cardiologist at [facility]  . Esophageal stricture   . Family history of melanoma ]  . Family history of pancreatic cancer   . Fatty liver   . Fatty liver   . Fibroids   . Fibromyalgia    "in my legs"  .  GERD (gastroesophageal reflux disease)    hx hiatal hernia, stricture and gastric ulcer  . Headache(784.0)    migraines  . Heart murmur   . Hiatal hernia   . History of loop recorder   . History of migraines    "dx'd when I was in my teens"  . Hyperlipemia   . Hypertension   . Mental disorder   . Mild episode of recurrent major depressive disorder (HCC) 12/06/2015  . Myocardial infarction (HCC) 1980's & 1990;   sees Dr. Ganji  . OSA (obstructive sleep apnea)   . Personal history of chemotherapy   . Personal history of radiation therapy   . Pneumonia    multiple times  . PONV (postoperative nausea and vomiting)   . Recurrent upper respiratory  infection (URI)   . Shortness of breath 11/20/11   "all the time", sees pulmonlogy, ? asthma  . Stenotic cervical os   . Stomach ulcer    "3 small; found in 05/2011"  . TMJ (dislocation of temporomandibular joint)   . Tuberculosis    + TB SKIN TEST  . Type 2 diabetes mellitus without complication, without long-term current use of insulin (HCC) 12/06/2015   not on meds    Past Surgical History:  Procedure Laterality Date  . ACHILLES TENDON REPAIR  1970's   left ankle  . ARTHROSCOPIC REPAIR ACL     left knee cap  . BREAST BIOPSY Right 04/08/2013   again in October or November 2020  . BREAST LUMPECTOMY WITH RADIOACTIVE SEED AND SENTINEL LYMPH NODE BIOPSY Left 12/09/2019   Procedure: LEFT BREAST LUMPECTOMY WITH RADIOACTIVE SEED AND LEFT AXILLARY SENTINEL LYMPH NODE BIOPSY;  Surgeon: Wakefield, Matthew, MD;  Location: [facility] OR;  Service: General;  Laterality: Left;  PEC BLOCK  . CARDIAC CATHETERIZATION     loop recorder  . CARPAL TUNNEL RELEASE  unknown   left hand  . COLONOSCOPY    . ESOPHAGOGASTRODUODENOSCOPY (EGD) WITH PROPOFOL N/A 10/29/2017   Procedure: ESOPHAGOGASTRODUODENOSCOPY (EGD) WITH PROPOFOL;  Surgeon: Perry, John N, MD;  Location: [facility];  Service: Endoscopy;  Laterality: N/A;  . LOOP RECORDER IMPLANT    . PORT-A-CATH REMOVAL N/A 12/09/2019   Procedure: REMOVAL PORT-A-CATH;  Surgeon: Wakefield, Matthew, MD;  Location: [facility] OR;  Service: General;  Laterality: N/A;  . PORTACATH PLACEMENT N/A 09/23/2019   Procedure: INSERTION PORT-A-CATH Right Internal JugularWITH ULTRASOUND;  Surgeon: Wakefield, Matthew, MD;  Location: [facility] OR;  Service: General;  Laterality: N/A;  . post ganglionectomy  1970's   "for migraine headaches"  . pouch string  79,81,83   "did this 3 times (once w/each pregnancy)"  . SAVORY DILATION N/A 10/29/2017   Procedure: SAVORY DILATION;  Surgeon: Perry, John N, MD;  Location: [facility];  Service: Endoscopy;  Laterality: N/A;  . TOTAL KNEE ARTHROPLASTY Left  09/25/2016   Procedure: LEFT TOTAL KNEE ARTHROPLASTY;  Surgeon: Matthew Olin, MD;  Location: [facility]S;  Service: Orthopedics;  Laterality: Left;  . TUBAL LIGATION  1980's   Allergies  Allergen Reactions  . Lodine [Etodolac] Anaphylaxis, Hives and Swelling  . Oxycontin [Oxycodone Hcl] Anaphylaxis    hives, trouble breathing, tongue swelling (Only Oxycontin) Tolerates plain oxycodone.  . Penicillins Anaphylaxis    Told by a surgeon never to take it again. Has patient had a PCN reaction causing immediate rash, facial/tongue/throat swelling, SOB or lightheadedness with hypotension: Yes Has patient had a PCN reaction causing severe rash involving mucus membranes or skin necrosis: Unknown Has patient had a PCN   reaction that required hospitalization: No Has patient had a PCN reaction occurring within the last 10 years: No If all of the above answers are "NO", then may proceed with Cephalosporin use.  . Aspirin Other (See Comments)    High-dose caused GI Bleeds  . Darvocet [Propoxyphene N-Acetaminophen] Hives  . Nitroglycerin Other (See Comments)    IV-BP drops dramatically Can take po  . Tramadol Hives and Itching  . Ultram [Tramadol Hcl] Hives  . Valium Other (See Comments)    Circulation problems. "Legs turned black".   Current Meds  Medication Sig  . albuterol (PROVENTIL) (2.5 MG/3ML) 0.083% nebulizer solution Take 3 mLs (2.5 mg total) by nebulization every 6 (six) hours as needed for wheezing or shortness of breath (J45.40). (Patient taking differently: Take 2.5 mg by nebulization every 6 (six) hours as needed for wheezing or shortness of breath.)  . albuterol (VENTOLIN HFA) 108 (90 Base) MCG/ACT inhaler Inhale 2 puffs into the lungs every 6 (six) hours as needed for wheezing or shortness of breath.  . aspirin EC 81 MG tablet Take 81 mg at bedtime by mouth.   . atorvastatin (LIPITOR) 40 MG tablet TAKE 1 TABLET BY MOUTH EVERYDAY AT BEDTIME. **DUE FOR YEARLY PHYSICAL**  . Blood Glucose  Monitoring Suppl (ONE TOUCH ULTRA 2) w/Device KIT USE TO CHECK BLOOD SUGAR 2 TIMES A DAY  . budesonide-formoterol (SYMBICORT) 80-4.5 MCG/ACT inhaler INHALE 2 PUFFS INTO THE LUNGS EVERY MORNING AND ANOTHER 2 PUFFS 12 HOURS LATER  . buPROPion (WELLBUTRIN XL) 150 MG 24 hr tablet Take 1 tablet (150 mg total) by mouth daily.  . Calcium Carbonate-Vitamin D 600-200 MG-UNIT TABS Take 1 tablet by mouth daily.  . CINNAMON PO Take 1,000 mg 2 (two) times daily by mouth.  . clobetasol cream (TEMOVATE) 0.05 % Apply topically 2 (two) times daily. (Patient taking differently: Apply topically daily as needed (rash).)  . doxepin (SINEQUAN) 10 MG capsule Take 20 mg by mouth at bedtime.   . famotidine (PEPCID) 20 MG tablet TAKE 1 TABLET BY MOUTH 2 TIMES DAILY AS NEEDED FOR HEARTBURN OR INDIGESTION.  . fluticasone (FLONASE) 50 MCG/ACT nasal spray Place 2 sprays at bedtime as needed into both nostrils for allergies or rhinitis.   . furosemide (LASIX) 20 MG tablet Take 1 tablet (20 mg total) by mouth daily as needed for fluid.  . glipiZIDE (GLUCOTROL XL) 5 MG 24 hr tablet TAKE 1 TABLET BY MOUTH EVERYDAY WITH BREAKFAST. **DUE FOR YEARLY PHYSICAL**  . L-Methylfolate (DEPLIN) 7.5 MG TABS Take 7.5 mg by mouth daily with breakfast.  . Lancets (ONETOUCH DELICA PLUS LANCET30G) MISC USE TO CHECK BLOOD SUGAR 2 TIMES A DAY  . levothyroxine (SYNTHROID) 50 MCG tablet TAKE 1 TABLET BY MOUTH EVERY DAY  . LYRICA 150 MG capsule Take 150 mg by mouth 3 (three) times daily.   . metFORMIN (GLUCOPHAGE) 500 MG tablet TAKE 1 TABLET BY MOUTH TWICE A DAY WITH MEALS  . metoprolol succinate (TOPROL-XL) 25 MG 24 hr tablet Take 1 tablet (25 mg total) by mouth daily. Take with or immediately following a meal.  . Misc Natural Products (GLUCOS-CHONDROIT-MSM COMPLEX PO) Take 1 tablet 3 (three) times daily by mouth.   . mometasone (ELOCON) 0.1 % cream Apply topically.  . montelukast (SINGULAIR) 10 MG tablet TAKE 1 TABLET BY MOUTH EVERYDAY AT BEDTIME  .  Multiple Vitamin (MULITIVITAMIN WITH MINERALS) TABS Take 1 tablet by mouth daily.  . mupirocin cream (BACTROBAN) 2 % Apply once daily to lesions on feet   as needed.  . NEXIUM 40 MG capsule TAKE 1 CAPSULE BY MOUTH TWICE A DAY  . nitroGLYCERIN (NITROSTAT) 0.4 MG SL tablet Place 0.4 mg under the tongue every 5 (five) minutes as needed for chest pain.   . ondansetron (ZOFRAN) 4 MG tablet TAKE 1 TABLET (4 MG TOTAL) BY MOUTH EVERY 4 (FOUR) HOURS AS NEEDED FOR NAUSEA OR VOMITING.  . ONETOUCH ULTRA test strip USE TO CHECK BLOOD SUGAR 2 TIMES A DAY  . Oxycodone HCl 20 MG TABS Take 20 mg every 4 (four) hours as needed by mouth (pain).   . potassium chloride (KLOR-CON) 10 MEQ tablet TAKE 2 TABLETS BY MOUTH EVERY DAY  . promethazine (PHENERGAN) 25 MG tablet TAKE 1 TABLET (25 MG TOTAL) BY MOUTH EVERY 6 (SIX) HOURS AS NEEDED FOR NAUSEA OR VOMITING.  . Spacer/Aero-Holding Chambers DEVI 1 Device by Does not apply route daily as needed.  . sucralfate (CARAFATE) 1 g tablet DISSOLVE 1 TAB IN 1 TABLESPOON OF DISTILLED WATER FOR 15 MINS. TAKE 4XDAILY WITH MEALS & AT BEDTIME  . tiZANidine (ZANAFLEX) 4 MG tablet Take 4 mg by mouth every 8 (eight) hours as needed for muscle spasms.  . vitamin B-12 (CYANOCOBALAMIN) 500 MCG tablet Take 500 mcg by mouth daily.   . Zoster Vaccine Adjuvanted (SHINGRIX) injection Inject 0.5 mLs into the muscle once for 1 dose. Repeat in 2-6 months   Social History   Tobacco Use  . Smoking status: Never Smoker  . Smokeless tobacco: Never Used  Substance Use Topics  . Alcohol use: No    Alcohol/week: 0.0 standard drinks   Family History  Problem Relation Age of Onset  . Malignant hyperthermia Father   . Hypertension Father   . Heart disease Father   . Diabetes Father   . Cancer Father        skin  . Hypertension Mother   . Heart disease Mother   . Multiple myeloma Mother   . Cancer Sister        CERVICAL  . Hypertension Sister   . Cancer Brother 22       MELANOMA  . Heart  disease Maternal Grandmother   . Other Maternal Grandmother 32       complications of childbirth  . Heart disease Maternal Grandfather   . Cancer Paternal Grandmother        ?   . Heart disease Paternal Grandmother   . Heart disease Paternal Grandfather   . Cancer Brother        LUNG  . Diabetes Sister   . Hypertension Sister   . Heart disease Sister   . Cancer Sister   . Cancer Brother   . Pancreatic cancer Niece 35  . Cancer Nephew 40       unknown- currently in the military  . Anesthesia problems Neg Hx   . Hypotension Neg Hx   . Pseudochol deficiency Neg Hx   . Colon cancer Neg Hx   . Esophageal cancer Neg Hx   . Rectal cancer Neg Hx   . Stomach cancer Neg Hx   . Breast cancer Neg Hx      Review of Systems  Constitutional: Positive for fatigue. Negative for activity change, appetite change, chills, fever and unexpected weight change.  HENT: Negative for congestion, ear pain, hearing loss, sinus pressure, sinus pain, sore throat and trouble swallowing.   Eyes: Negative for pain and visual disturbance.  Respiratory: Negative for cough, chest tightness, shortness of breath and wheezing.     Cardiovascular: Negative for chest pain, palpitations and leg swelling.  Gastrointestinal: Negative for abdominal pain, blood in stool, constipation, diarrhea, nausea and vomiting.  Genitourinary: Negative for difficulty urinating and menstrual problem.  Musculoskeletal: Positive for arthralgias, neck pain and neck stiffness. Negative for back pain.  Skin: Negative for rash.  Neurological: Negative for dizziness, weakness, numbness and headaches.  Hematological: Negative for adenopathy. Does not bruise/bleed easily.  Psychiatric/Behavioral: Negative for sleep disturbance and suicidal ideas. The patient is nervous/anxious.     CBC:  Lab Results  Component Value Date   WBC 5.5 09/30/2020   HGB 13.7 09/30/2020   HGB 11.3 (L) 11/05/2019   HCT 42.8 09/30/2020   MCH 27.2 09/30/2020    MCHC 32.0 09/30/2020   RDW 13.9 09/30/2020   PLT 271 09/30/2020   PLT 407 (H) 11/05/2019   MPV 10.9 09/30/2020   CMP: Lab Results  Component Value Date   NA 142 09/30/2020   K 4.2 09/30/2020   CL 101 09/30/2020   CO2 29 09/30/2020   ANIONGAP 14 12/08/2019   GLUCOSE 112 (H) 09/30/2020   BUN 12 09/30/2020   CREATININE 0.67 09/30/2020   GFRAA >60 12/08/2019   GFRAA >60 11/05/2019   CALCIUM 9.8 09/30/2020   PROT 7.5 09/30/2020   BILITOT 0.5 09/30/2020   BILITOT <0.2 (L) 11/05/2019   ALKPHOS 63 12/08/2019   ALT 25 09/30/2020   ALT 36 11/05/2019   AST 26 09/30/2020   AST 29 11/05/2019   LIPID: Lab Results  Component Value Date   CHOL 141 09/30/2020   TRIG 226 (H) 09/30/2020   HDL 37 (L) 09/30/2020   LDLCALC 73 09/30/2020    Objective:  BP 112/80 (BP Location: Right Arm, Patient Position: Sitting, Cuff Size: Large)   Pulse 68   Temp 98 F (36.7 C) (Oral)   Ht 5' 4.75" (1.645 m)   Wt 219 lb 9.6 oz (99.6 kg)   SpO2 98%   BMI 36.83 kg/m   Weight: 219 lb 9.6 oz (99.6 kg)   BP Readings from Last 3 Encounters:  02/24/21 112/80  02/01/21 110/80  01/30/21 128/88   Wt Readings from Last 3 Encounters:  02/24/21 219 lb 9.6 oz (99.6 kg)  02/01/21 215 lb 9.6 oz (97.8 kg)  01/30/21 216 lb (98 kg)    Physical Exam Constitutional:      General: She is not in acute distress.    Appearance: She is well-developed.  HENT:     Head: Normocephalic and atraumatic.     Right Ear: External ear normal.     Left Ear: External ear normal.     Mouth/Throat:     Pharynx: No oropharyngeal exudate.  Eyes:     Conjunctiva/sclera: Conjunctivae normal.     Pupils: Pupils are equal, round, and reactive to light.  Neck:     Thyroid: No thyromegaly.  Cardiovascular:     Rate and Rhythm: Normal rate and regular rhythm.     Heart sounds: Normal heart sounds. No murmur heard. No friction rub. No gallop.   Pulmonary:     Effort: Pulmonary effort is normal.     Breath sounds: Normal  breath sounds.  Abdominal:     General: Bowel sounds are normal. There is no distension.     Palpations: Abdomen is soft. There is no mass.     Tenderness: There is no abdominal tenderness. There is no guarding.     Hernia: No hernia is present.  Musculoskeletal:          General: No tenderness or deformity. Normal range of motion.     Cervical back: Normal range of motion and neck supple.  Lymphadenopathy:     Cervical: No cervical adenopathy.  Skin:    General: Skin is warm and dry.     Findings: No rash.  Neurological:     Mental Status: She is alert and oriented to person, place, and time.     Deep Tendon Reflexes: Reflexes normal.     Reflex Scores:      Tricep reflexes are 2+ on the right side and 2+ on the left side.      Bicep reflexes are 2+ on the right side and 2+ on the left side.      Brachioradialis reflexes are 2+ on the right side and 2+ on the left side.      Patellar reflexes are 2+ on the right side and 2+ on the left side. Psychiatric:        Speech: Speech normal.        Behavior: Behavior normal.        Thought Content: Thought content normal.    PHQ9 SCORE ONLY 02/24/2021 01/06/2021 12/31/2019  PHQ-9 Total Score 6 8 15     Assessment/Plan: Health Maintenance Due  Topic Date Due  . URINE MICROALBUMIN  07/25/2019  . COVID-19 Vaccine (4 - Booster for Moderna series) 01/31/2021   Health Maintenance reviewed.  1. Preventative health care Discussed importance of regular exercise and healthy eating.  2. Hypertension associated with diabetes (HCC) She was recently restarted on metoprolol from cardiology to maintain blood pressure control as well as for help with chronic chest pain.  This had helped her in the past.  I would consider ACE/ARB for renal protection, but also do not want to drop her blood pressure too much since she does have a history of orthostatic hypotension.  This is certainly something we will continue to assess and may add on in the  future.  3. Mild persistent asthma without complication This is been well controlled with current medications including Symbicort, Singulair and albuterol as needed  4. Gastroesophageal reflux disease without esophagitis She has follow-up set up with GI for the summer.  Currently taking Pepcid, Carafate, Zofran as needed  5. Hyperlipidemia associated with type 2 diabetes mellitus (HCC) Continue Lipitor 40 mg daily.  6. Depression, major, single episode, moderate (HCC) Continue with Wellbutrin 150 mg daily, doxepin grams nightly.  7. Neck pain With additional imaging.  Is causing her tenuous pain and has been bothering her for years.  I do feel that further imaging will help guide future treatment plans.  8. Dizziness See above.  Seems related to neck pain when it occurs.  9. Other cervical disc degeneration, unspecified cervical region - MR Cervical Spine Wo Contrast; Future  10. Intramural leiomyoma of uterus She follows with Dr. Lavoie for gyn management.   11. Need for pneumococcal vaccination - Pneumococcal conjugate vaccine 13-valent IM   Return in about 6 months (around 08/27/2021).  Junell Koberlein, MD     

## 2021-02-27 ENCOUNTER — Other Ambulatory Visit (INDEPENDENT_AMBULATORY_CARE_PROVIDER_SITE_OTHER): Payer: Medicare Other

## 2021-02-27 ENCOUNTER — Other Ambulatory Visit: Payer: Medicare Other

## 2021-02-27 ENCOUNTER — Other Ambulatory Visit: Payer: Self-pay

## 2021-02-27 DIAGNOSIS — E1159 Type 2 diabetes mellitus with other circulatory complications: Secondary | ICD-10-CM

## 2021-02-27 DIAGNOSIS — I152 Hypertension secondary to endocrine disorders: Secondary | ICD-10-CM

## 2021-02-27 DIAGNOSIS — E119 Type 2 diabetes mellitus without complications: Secondary | ICD-10-CM

## 2021-02-27 DIAGNOSIS — E1169 Type 2 diabetes mellitus with other specified complication: Secondary | ICD-10-CM | POA: Diagnosis not present

## 2021-02-27 DIAGNOSIS — E785 Hyperlipidemia, unspecified: Secondary | ICD-10-CM | POA: Diagnosis not present

## 2021-02-27 LAB — CBC WITH DIFFERENTIAL/PLATELET
Basophils Absolute: 0 10*3/uL (ref 0.0–0.1)
Basophils Relative: 0.5 % (ref 0.0–3.0)
Eosinophils Absolute: 0.2 10*3/uL (ref 0.0–0.7)
Eosinophils Relative: 4.1 % (ref 0.0–5.0)
HCT: 38.1 % (ref 36.0–46.0)
Hemoglobin: 12.6 g/dL (ref 12.0–15.0)
Lymphocytes Relative: 23.6 % (ref 12.0–46.0)
Lymphs Abs: 1.3 10*3/uL (ref 0.7–4.0)
MCHC: 33.1 g/dL (ref 30.0–36.0)
MCV: 85.1 fl (ref 78.0–100.0)
Monocytes Absolute: 0.4 10*3/uL (ref 0.1–1.0)
Monocytes Relative: 7.9 % (ref 3.0–12.0)
Neutro Abs: 3.5 10*3/uL (ref 1.4–7.7)
Neutrophils Relative %: 63.9 % (ref 43.0–77.0)
Platelets: 222 10*3/uL (ref 150.0–400.0)
RBC: 4.48 Mil/uL (ref 3.87–5.11)
RDW: 15.2 % (ref 11.5–15.5)
WBC: 5.5 10*3/uL (ref 4.0–10.5)

## 2021-02-27 LAB — COMPREHENSIVE METABOLIC PANEL
ALT: 18 U/L (ref 0–35)
AST: 18 U/L (ref 0–37)
Albumin: 3.7 g/dL (ref 3.5–5.2)
Alkaline Phosphatase: 68 U/L (ref 39–117)
BUN: 12 mg/dL (ref 6–23)
CO2: 34 mEq/L — ABNORMAL HIGH (ref 19–32)
Calcium: 9 mg/dL (ref 8.4–10.5)
Chloride: 101 mEq/L (ref 96–112)
Creatinine, Ser: 0.65 mg/dL (ref 0.40–1.20)
GFR: 94.01 mL/min (ref >60.00)
Glucose, Bld: 89 mg/dL (ref 70–99)
Potassium: 4.1 mEq/L (ref 3.5–5.1)
Sodium: 142 mEq/L (ref 135–145)
Total Bilirubin: 0.4 mg/dL (ref 0.2–1.2)
Total Protein: 6.5 g/dL (ref 6.0–8.3)

## 2021-02-27 LAB — LIPID PANEL
Cholesterol: 119 mg/dL (ref 0–200)
HDL: 31 mg/dL — ABNORMAL LOW (ref >39.00)
LDL Cholesterol: 51 mg/dL (ref 0–99)
NonHDL: 87.65
Total CHOL/HDL Ratio: 4
Triglycerides: 183 mg/dL — ABNORMAL HIGH (ref 0.0–149.0)
VLDL: 36.6 mg/dL (ref 0.0–40.0)

## 2021-02-27 LAB — MICROALBUMIN / CREATININE URINE RATIO
Creatinine,U: 127.7 mg/dL
Microalb Creat Ratio: 0.9 mg/g (ref 0.0–30.0)
Microalb, Ur: 1.1 mg/dL (ref 0.0–1.9)

## 2021-02-27 LAB — HEMOGLOBIN A1C: Hgb A1c MFr Bld: 7.4 % — ABNORMAL HIGH (ref 4.6–6.5)

## 2021-03-13 ENCOUNTER — Encounter: Payer: Self-pay | Admitting: Student

## 2021-03-13 ENCOUNTER — Other Ambulatory Visit: Payer: Self-pay

## 2021-03-13 ENCOUNTER — Ambulatory Visit: Payer: Medicare Other | Admitting: Student

## 2021-03-13 VITALS — BP 116/65 | HR 65 | Temp 98.3°F | Ht 64.0 in | Wt 213.0 lb

## 2021-03-13 DIAGNOSIS — I10 Essential (primary) hypertension: Secondary | ICD-10-CM | POA: Diagnosis not present

## 2021-03-13 DIAGNOSIS — I951 Orthostatic hypotension: Secondary | ICD-10-CM

## 2021-03-13 NOTE — Progress Notes (Signed)
Primary Physician/Referring:  Caren Macadam, MD  Patient ID: Shelby Anderson, female    DOB: 05-19-1956, 65 y.o.   MRN: [id number]  Chief Complaint  Patient presents with  . Hypertension  . Follow-up   HPI:    Shelby Anderson  is a 65 y.o. obese Caucasian female with history of NSTEMI with normal coronary arteries, diabetes mellitus, orthostatic hypotension, chronic chest pain syndrome, recurrent syncope status post loop recorder implantation by [facility] cardiology.  She also has history of chronic severe back pain, bronchial asthma and severe restrictive lung disease, severe GERD, distal esophageal stricture status post dilation, peptic ulcer disease, depression.  Patient is a retired [employer] scientist.  Echocardiogram and February 2018 revealed RV dilation.  Patient presents for 6-week follow-up of hypertension and chronic chest pain syndrome.  At last visit restarted patient on metoprolol succinate 25 mg once daily.  Patient reports she is tolerating metoprolol succinate without issue.  Although she does report occasional episodes of dizziness or lightheadedness when bending over.  These episodes only last several seconds.  She reports chronic chest pain has improved, and she has not needed nitroglycerin.  Patient denies palpitations, dyspnea, syncope, near-syncope, PND, orthopnea, leg edema.  She continues to walk on a regular basis.  Patient reports home blood pressure readings averaging 110-115/65-70 mmHg.  Past Medical History:  Diagnosis Date  . Allergy   . Anemia    "chronic"  . Angina    Prinzmetal angina  . Anxiety and depression   . Arthritis   . Asthma   . Atrial fibrillation ([place])    h/o "AF w/frequent PVCs"  . Breast cancer ([place])    Left  . Cancer (HCC)    hx of skin cancer   . CHF (congestive heart failure) ([place])   . Chronic back pain greater than 3 months duration    on chronic narcotics, treated at pain clinic  . Chronic respiratory failure ([place]) 09/15/2015    ONO 09/04/15 + desats >begin O2 at 2l/ m   . Colon polyps    hyperplastic  . Coronary artery disease    Arrythmia, orthostatic hypotension, HLD, HTN; sees Dr. Einar Gip  . Difficult intubation    "TMJ & woke up when they were still cutting on me"  . Dysrhythmia    sees Dr. Einar Gip and a cardiologist at [facility]  . Esophageal stricture   . Family history of melanoma ]  . Family history of pancreatic cancer   . Fatty liver   . Fatty liver   . Fibroids   . Fibromyalgia    "in my legs"  . GERD (gastroesophageal reflux disease)    hx hiatal hernia, stricture and gastric ulcer  . Headache(784.0)    migraines  . Heart murmur   . Hiatal hernia   . History of loop recorder   . History of migraines    "dx'd when I was in my teens"  . Hyperlipemia   . Hypertension   . Mental disorder   . Mild episode of recurrent major depressive disorder ([place]) 12/06/2015  . Myocardial infarction [facility]) 1980's & 1990;   sees Dr. Einar Gip  . OSA (obstructive sleep apnea)   . Personal history of chemotherapy   . Personal history of radiation therapy   . Pneumonia    multiple times  . PONV (postoperative nausea and vomiting)   . Recurrent upper respiratory infection (URI)   . Shortness of breath 11/20/11   "all the time", sees pulmonlogy, ? asthma  .  Stenotic cervical os   . Stomach ulcer    "3 small; found in 05/2011"  . TMJ (dislocation of temporomandibular joint)   . Tuberculosis    + TB SKIN TEST  . Type 2 diabetes mellitus without complication, without long-term current use of insulin ([place]) 12/06/2015   not on meds    Past Surgical History:  Procedure Laterality Date  . ACHILLES TENDON REPAIR  1970's   left ankle  . ARTHROSCOPIC REPAIR ACL     left knee cap  . BREAST BIOPSY Right 04/08/2013   again in October or November 2020  . BREAST LUMPECTOMY WITH RADIOACTIVE SEED AND SENTINEL LYMPH NODE BIOPSY Left 12/09/2019   Procedure: LEFT BREAST LUMPECTOMY WITH RADIOACTIVE SEED AND LEFT AXILLARY SENTINEL  LYMPH NODE BIOPSY;  Surgeon: Rolm Bookbinder, MD;  Location: [place];  Service: General;  Laterality: Left;  PEC BLOCK  . CARDIAC CATHETERIZATION     loop recorder  . CARPAL TUNNEL RELEASE  unknown   left hand  . COLONOSCOPY    . ESOPHAGOGASTRODUODENOSCOPY (EGD) WITH PROPOFOL N/A 10/29/2017   Procedure: ESOPHAGOGASTRODUODENOSCOPY (EGD) WITH PROPOFOL;  Surgeon: Irene Shipper, MD;  Location: [facility];  Service: Endoscopy;  Laterality: N/A;  . LOOP RECORDER IMPLANT    . PORT-A-CATH REMOVAL N/A 12/09/2019   Procedure: REMOVAL PORT-A-CATH;  Surgeon: Rolm Bookbinder, MD;  Location: [place];  Service: General;  Laterality: N/A;  . PORTACATH PLACEMENT N/A 09/23/2019   Procedure: INSERTION PORT-A-CATH Right Internal Doreen Salvage;  Surgeon: Rolm Bookbinder, MD;  Location: [place];  Service: General;  Laterality: N/A;  . post ganglionectomy  1970's   "for migraine headaches"  . pouch string  [phone]   "did this 3 times (once w/each pregnancy)"  . SAVORY DILATION N/A 10/29/2017   Procedure: SAVORY DILATION;  Surgeon: Irene Shipper, MD;  Location: [facility];  Service: Endoscopy;  Laterality: N/A;  . TOTAL KNEE ARTHROPLASTY Left 09/25/2016   Procedure: LEFT TOTAL KNEE ARTHROPLASTY;  Surgeon: Paralee Cancel, MD;  Location: [facility];  Service: Orthopedics;  Laterality: Left;  . TUBAL LIGATION  1980's   Family History  Problem Relation Age of Onset  . Malignant hyperthermia Father   . Hypertension Father   . Heart disease Father   . Diabetes Father   . Cancer Father        skin  . Hypertension Mother   . Heart disease Mother   . Multiple myeloma Mother   . Cancer Sister        CERVICAL  . Hypertension Sister   . Cancer Brother 22       MELANOMA  . Heart disease Maternal Grandmother   . Other Maternal Grandmother 32       complications of childbirth  . Heart disease Maternal Grandfather   . Cancer Paternal Grandmother        ?   Marland Kitchen Heart disease Paternal Grandmother   .  Heart disease Paternal Grandfather   . Cancer Brother        LUNG  . Diabetes Sister   . Hypertension Sister   . Heart disease Sister   . Cancer Sister   . Cancer Brother   . Pancreatic cancer Niece 67  . Cancer Nephew 40       unknown- currently in the [employer]  . Anesthesia problems Neg Hx   . Hypotension Neg Hx   . Pseudochol deficiency Neg Hx   . Colon cancer Neg Hx   . Esophageal cancer Neg Hx   .  Rectal cancer Neg Hx   . Stomach cancer Neg Hx   . Breast cancer Neg Hx     Social History   Tobacco Use  . Smoking status: Never Smoker  . Smokeless tobacco: Never Used  Substance Use Topics  . Alcohol use: No    Alcohol/week: 0.0 standard drinks   Marital Status: Married   ROS  Review of Systems  Constitutional: Negative for malaise/fatigue and weight gain.  Cardiovascular: Positive for chest pain (stable, chronic). Negative for claudication, leg swelling, near-syncope, orthopnea, palpitations, paroxysmal nocturnal dyspnea and syncope.  Respiratory: Negative for shortness of breath.   Hematologic/Lymphatic: Does not bruise/bleed easily.  Gastrointestinal: Negative for melena.  Neurological: Positive for dizziness (orthostatic). Negative for weakness.    Objective  Blood pressure 116/65, pulse 65, temperature 98.3 F (36.8 C), height _0  (1.626 m), weight 213 lb (96.6 kg), SpO2 96 %.  Vitals with BMI 03/13/2021 02/24/2021 02/01/2021  Height _1  5' 4.75" _2   Weight 213 lbs 219 lbs 10 oz 215 lbs 10 oz  BMI 36.54 03.50 09.38  Systolic 182 993 716  Diastolic 65 80 80  Pulse 65 68 89    Orthostatic VS for the past 72 hrs (Last 3 readings):  Patient Position BP Location Cuff Size  03/13/21 1459 Sitting Right Arm Large     Physical Exam Vitals reviewed.  Constitutional:      Appearance: She is obese.  HENT:     Head: Normocephalic and atraumatic.  Cardiovascular:     Rate and Rhythm: Normal rate and regular rhythm.     Pulses: Intact distal pulses.           Carotid pulses are 2+ on the right side and 2+ on the left side.      Radial pulses are 2+ on the right side and 2+ on the left side.       Dorsalis pedis pulses are 2+ on the right side and 2+ on the left side.       Posterior tibial pulses are 1+ on the right side and 1+ on the left side.     Heart sounds: S1 normal and S2 normal. No murmur heard. No gallop.      Comments: Femoral and popliteal pulses difficult to evaluate due to patient body habitus. Pulmonary:     Effort: Pulmonary effort is normal. No respiratory distress.     Breath sounds: No wheezing, rhonchi or rales.  Musculoskeletal:     Right lower leg: No edema.     Left lower leg: No edema.  Skin:    General: Skin is warm and dry.     Capillary Refill: Capillary refill takes less than 2 seconds.  Neurological:     Mental Status: She is alert.     Laboratory examination:   Recent Labs    09/30/20 1533 02/27/21 1300  NA 142 142  K 4.2 4.1  CL 101 101  CO2 29 34*  GLUCOSE 112* 89  BUN 12 12  CREATININE 0.67 0.65  CALCIUM 9.8 9.0   estimated creatinine clearance is 82.3 mL/min (by C-G formula based on SCr of 0.65 mg/dL).  CMP Latest Ref Rng & Units 02/27/2021 09/30/2020 12/08/2019  Glucose 70 - 99 mg/dL 89 112(H) 150(H)  BUN 6 - 23 mg/dL 12 12 6(L)  Creatinine 0.40 - 1.20 mg/dL 0.65 0.67 0.60  Sodium 135 - 145 mEq/L 142 142 143  Potassium 3.5 - 5.1 mEq/L 4.1 4.2 3.7  Chloride 96 -  112 mEq/L 101 101 102  CO2 19 - 32 mEq/L 34(H) 29 27  Calcium 8.4 - 10.5 mg/dL 9.0 9.8 9.2  Total Protein 6.0 - 8.3 g/dL 6.5 7.5 6.6  Total Bilirubin 0.2 - 1.2 mg/dL 0.4 0.5 0.3  Alkaline Phos 39 - 117 U/L 68 - 63  AST 0 - 37 U/L _0 ALT 0 - 35 U/L _1 CBC Latest Ref Rng & Units 02/27/2021 09/30/2020 12/08/2019  WBC 4.0 - 10.5 K/uL 5.5 5.5 5.7  Hemoglobin 12.0 - 15.0 g/dL 12.6 13.7 12.5  Hematocrit 36.0 - 46.0 % 38.1 42.8 40.9  Platelets 150.0 - 400.0 K/uL 222.0 271 265    Lipid Panel Recent Labs     09/30/20 1533 02/27/21 1300  CHOL 141 119  TRIG 226* 183.0*  LDLCALC 73 51  VLDL  --  36.6  HDL 37* 31.00*  CHOLHDL 3.8 4    HEMOGLOBIN A1C Lab Results  Component Value Date   HGBA1C 7.4 (H) 02/27/2021   MPG 160 09/30/2020   TSH Recent Labs    09/30/20 1533 12/07/20 1433  TSH 6.50* 2.99    External labs:   None   Medications and allergies   Allergies  Allergen Reactions  . Lodine [Etodolac] Anaphylaxis, Hives and Swelling  . Oxycontin [Oxycodone Hcl] Anaphylaxis    hives, trouble breathing, tongue swelling (Only Oxycontin) Tolerates plain oxycodone.  Marland Kitchen Penicillins Anaphylaxis    Told by a surgeon never to take it again. Has patient had a PCN reaction causing immediate rash, facial/tongue/throat swelling, SOB or lightheadedness with hypotension: Yes Has patient had a PCN reaction causing severe rash involving mucus membranes or skin necrosis: Unknown Has patient had a PCN reaction that required hospitalization: No Has patient had a PCN reaction occurring within the last 10 years: No If all of the above answers are "NO", then may proceed with Cephalosporin use.  . Aspirin Other (See Comments)    High-dose caused GI Bleeds  . Darvocet [Propoxyphene N-Acetaminophen] Hives  . Nitroglycerin Other (See Comments)    IV-BP drops dramatically Can take po  . Tramadol Hives and Itching  . Ultram [Tramadol Hcl] Hives  . Valium Other (See Comments)    Circulation problems. "Legs turned black".     Outpatient Medications Prior to Visit  Medication Sig Dispense Refill  . albuterol (PROVENTIL) (2.5 MG/3ML) 0.083% nebulizer solution Take 3 mLs (2.5 mg total) by nebulization every 6 (six) hours as needed for wheezing or shortness of breath (J45.40). (Patient taking differently: Take 2.5 mg by nebulization every 6 (six) hours as needed for wheezing or shortness of breath.) 75 mL 3  . albuterol (VENTOLIN HFA) 108 (90 Base) MCG/ACT inhaler Inhale 2 puffs into the lungs every 6  (six) hours as needed for wheezing or shortness of breath. 3 g 3  . aspirin EC 81 MG tablet Take 81 mg at bedtime by mouth.     Marland Kitchen atorvastatin (LIPITOR) 40 MG tablet TAKE 1 TABLET BY MOUTH EVERYDAY AT BEDTIME. **DUE FOR YEARLY PHYSICAL** 30 tablet 5  . Blood Glucose Monitoring Suppl (ONE TOUCH ULTRA 2) w/Device KIT USE TO CHECK BLOOD SUGAR 2 TIMES A DAY 1 kit 0  . budesonide-formoterol (SYMBICORT) 80-4.5 MCG/ACT inhaler INHALE 2 PUFFS INTO THE LUNGS EVERY MORNING AND ANOTHER 2 PUFFS 12 HOURS LATER 30.6 Inhaler 1  . buPROPion (WELLBUTRIN XL) 150 MG 24 hr tablet Take 1 tablet (150 mg total) by mouth daily. 90 tablet 1  .  Calcium Carbonate-Vitamin D 600-200 MG-UNIT TABS Take 1 tablet by mouth daily.    Marland Kitchen CINNAMON PO Take 1,000 mg 2 (two) times daily by mouth.    . clobetasol cream (TEMOVATE) 0.05 % Apply topically 2 (two) times daily. (Patient taking differently: Apply topically daily as needed (rash).) 30 g 0  . doxepin (SINEQUAN) 10 MG capsule Take 20 mg by mouth at bedtime.     . famotidine (PEPCID) 20 MG tablet TAKE 1 TABLET BY MOUTH 2 TIMES DAILY AS NEEDED FOR HEARTBURN OR INDIGESTION. 180 tablet 1  . fluticasone (FLONASE) 50 MCG/ACT nasal spray Place 2 sprays at bedtime as needed into both nostrils for allergies or rhinitis.     . furosemide (LASIX) 20 MG tablet Take 1 tablet (20 mg total) by mouth daily as needed for fluid. 90 tablet 3  . glipiZIDE (GLUCOTROL XL) 5 MG 24 hr tablet TAKE 1 TABLET BY MOUTH EVERYDAY WITH BREAKFAST. **DUE FOR YEARLY PHYSICAL** 30 tablet 2  . L-Methylfolate (DEPLIN) 7.5 MG TABS Take 7.5 mg by mouth daily with breakfast.    . Lancets (ONETOUCH DELICA PLUS [id number]) MISC USE TO CHECK BLOOD SUGAR 2 TIMES A DAY 100 each 3  . levothyroxine (SYNTHROID) 50 MCG tablet TAKE 1 TABLET BY MOUTH EVERY DAY 90 tablet 0  . LYRICA 150 MG capsule Take 150 mg by mouth 3 (three) times daily.   2  . metFORMIN (GLUCOPHAGE) 500 MG tablet TAKE 1 TABLET BY MOUTH TWICE A DAY WITH MEALS 60  tablet 2  . metoprolol succinate (TOPROL-XL) 25 MG 24 hr tablet Take 1 tablet (25 mg total) by mouth daily. Take with or immediately following a meal. 90 tablet 3  . Misc Natural Products (GLUCOS-CHONDROIT-MSM COMPLEX PO) Take 1 tablet 3 (three) times daily by mouth.     . mometasone (ELOCON) 0.1 % cream Apply topically.    . montelukast (SINGULAIR) 10 MG tablet TAKE 1 TABLET BY MOUTH EVERYDAY AT BEDTIME 90 tablet 1  . Multiple Vitamin (MULITIVITAMIN WITH MINERALS) TABS Take 1 tablet by mouth daily.    . mupirocin cream (BACTROBAN) 2 % Apply once daily to lesions on feet as needed. 30 g 1  . NEXIUM 40 MG capsule TAKE 1 CAPSULE BY MOUTH TWICE A DAY 180 capsule 1  . nitroGLYCERIN (NITROSTAT) 0.4 MG SL tablet Place 0.4 mg under the tongue every 5 (five) minutes as needed for chest pain.     Marland Kitchen ondansetron (ZOFRAN) 4 MG tablet TAKE 1 TABLET (4 MG TOTAL) BY MOUTH EVERY 4 (FOUR) HOURS AS NEEDED FOR NAUSEA OR VOMITING. 90 tablet 3  . ONETOUCH ULTRA test strip USE TO CHECK BLOOD SUGAR 2 TIMES A DAY 100 strip 3  . Oxycodone HCl 20 MG TABS Take 20 mg every 4 (four) hours as needed by mouth (pain).     . potassium chloride (KLOR-CON) 10 MEQ tablet TAKE 2 TABLETS BY MOUTH EVERY DAY 180 tablet 0  . promethazine (PHENERGAN) 25 MG tablet TAKE 1 TABLET (25 MG TOTAL) BY MOUTH EVERY 6 (SIX) HOURS AS NEEDED FOR NAUSEA OR VOMITING. 90 tablet 1  . Spacer/Aero-Holding Chambers DEVI 1 Device by Does not apply route daily as needed. 1 Device 0  . sucralfate (CARAFATE) 1 g tablet DISSOLVE 1 TAB IN 1 TABLESPOON OF DISTILLED WATER FOR 15 MINS. TAKE 4XDAILY WITH MEALS & AT BEDTIME 360 tablet 3  . tiZANidine (ZANAFLEX) 4 MG tablet Take 4 mg by mouth every 8 (eight) hours as needed for muscle spasms.    Marland Kitchen  vitamin B-12 (CYANOCOBALAMIN) 500 MCG tablet Take 500 mcg by mouth daily.     . vitamin E 200 UNIT capsule Take 200 Units by mouth daily.     No facility-administered medications prior to visit.     Radiology:   No  results found.  Cardiac Studies:  Echocardiogram 10/01/2019: 1. Left ventricular ejection fraction, by visual estimation, is 60 to  65%. The left ventricle has normal function. There is no left ventricular  hypertrophy.  2. The left ventricle has no regional wall motion abnormalities.  3. Global right ventricle has normal systolic function.The right  ventricular size is mildly enlarged. No increase in right ventricular wall  thickness.  4. Left atrial size was normal.  5. Right atrial size was normal.  6. Presence of pericardial fat pad.  7. Mild to moderate mitral annular calcification.  8. The mitral valve is degenerative. No evidence of mitral valve  regurgitation. No evidence of mitral stenosis.  9. The tricuspid valve is grossly normal. Tricuspid valve regurgitation  is trivial.  10. The aortic valve is tricuspid. Aortic valve regurgitation is not  visualized. No evidence of aortic valve sclerosis or stenosis.  11. There is Mild calcification of the aortic valve.  12. There is Mild thickening of the aortic valve.  13. The pulmonic valve was grossly normal. Pulmonic valve regurgitation is  not visualized.  14. Mild plaque invoving the ascending aorta.  15. Normal pulmonary artery systolic pressure.  16. The tricuspid regurgitant velocity is 1.94 m/s, and with an assumed  right atrial pressure of 15 mmHg, the estimated right ventricular systolic  pressure is normal at 30.1 mmHg.  17. The inferior vena cava is dilated in size with <50% respiratory  variability, suggesting right atrial pressure of 15 mmHg.   Echocardiogram 07/16/2017: Left ventricle cavity is normal in size.  Normal global wall motion.  Calculated EF 62%. Moderate biatrial enlargement Moderate RV enlargement with preserved systolic function No significant change compared to prior study 01/2017.  Assessment of pulmonary systolic pressure is again limited due to inadequate TR jet visualization  Heart  catheterization 04/07/2010: Normal coronary arteries, LVEF 65%  EKG:   EKG 01/22/2021: Sinus rhythm at a rate of 86 bpm.  Left axis, left anterior fascicular block.  Poor progression, cannot exclude anteroseptal infarct old.  Nonspecific T wave abnormality.  Low voltage complexes, consider pulmonary disease pattern.  Compared to EKG 01/30/2018, no significant change.  EKG 01/30/2018: Normal sinus rhythm at a rate of 65 bpm, left axis deviation, left anterior fascicular block.  Poor progression, cannot exclude anteroseptal infarct old.  Nonspecific T abnormality.  No significant change from 07/31/2017.  Assessment     ICD-10-CM   1. Essential hypertension  I10   2. Orthostatic hypotension  I95.1      There are no discontinued medications.  No orders of the defined types were placed in this encounter.   Recommendations:   ATINA FEELEY is a 65 y.o. obese Caucasian female with history of NSTEMI with normal coronary arteries, diabetes mellitus, orthostatic hypotension, chronic chest pain syndrome, recurrent syncope status post loop recorder implantation by [facility] cardiology.  She also has history of chronic severe back pain, bronchial asthma and severe restrictive lung disease, severe GERD, distal esophageal stricture status post dilation, peptic ulcer disease, depression.  Patient is a retired [employer].  Patient presents for 6-week follow-up of hypertension and chronic chest pain syndrome.  At last visit restarted patient on metoprolol succinate 25 mg once daily.  Patient is tolerating metoprolol without issue and blood pressure is now well controlled.  Advised patient to continue with conservative measures regarding orthostatic hypotension including liberal hydration.  She does have history of asthma, therefore counseled patient regarding signs and symptoms that would warrant notifying our office, for consideration of discontinuation of metoprolol.  In regard to chronic chest pain syndrome,  symptoms are stable from 2019, therefore do not feel further investigation is warranted at this time.  Plan for repeat echocardiogram following next office visit in order to reevaluate left ventricle systolic function.   Follow-up in 6 months, sooner if needed, for hypertension and orthostatic hypotension.   Alethia Berthold, PA-C 03/13/2021, 3:26 PM Office: [phone]

## 2021-03-17 IMAGING — MG MM DIGITAL DIAGNOSTIC UNILAT*L* W/ TOMO W/ CAD
4 series · 4 of 12 positions shown · non-contrast
Comparison: Previous exam(s).

CLINICAL DATA: Recall from screening mammography with
tomosynthesis, possible mass or focal asymmetry associated with
subtle architectural distortion involving the UPPER OUTER LEFT
breast at MIDDLE to POSTERIOR depth.

EXAM:
DIGITAL DIAGNOSTIC LEFT MAMMOGRAM WITH TOMO
ULTRASOUND LEFT BREAST

[L CC synth-2D]
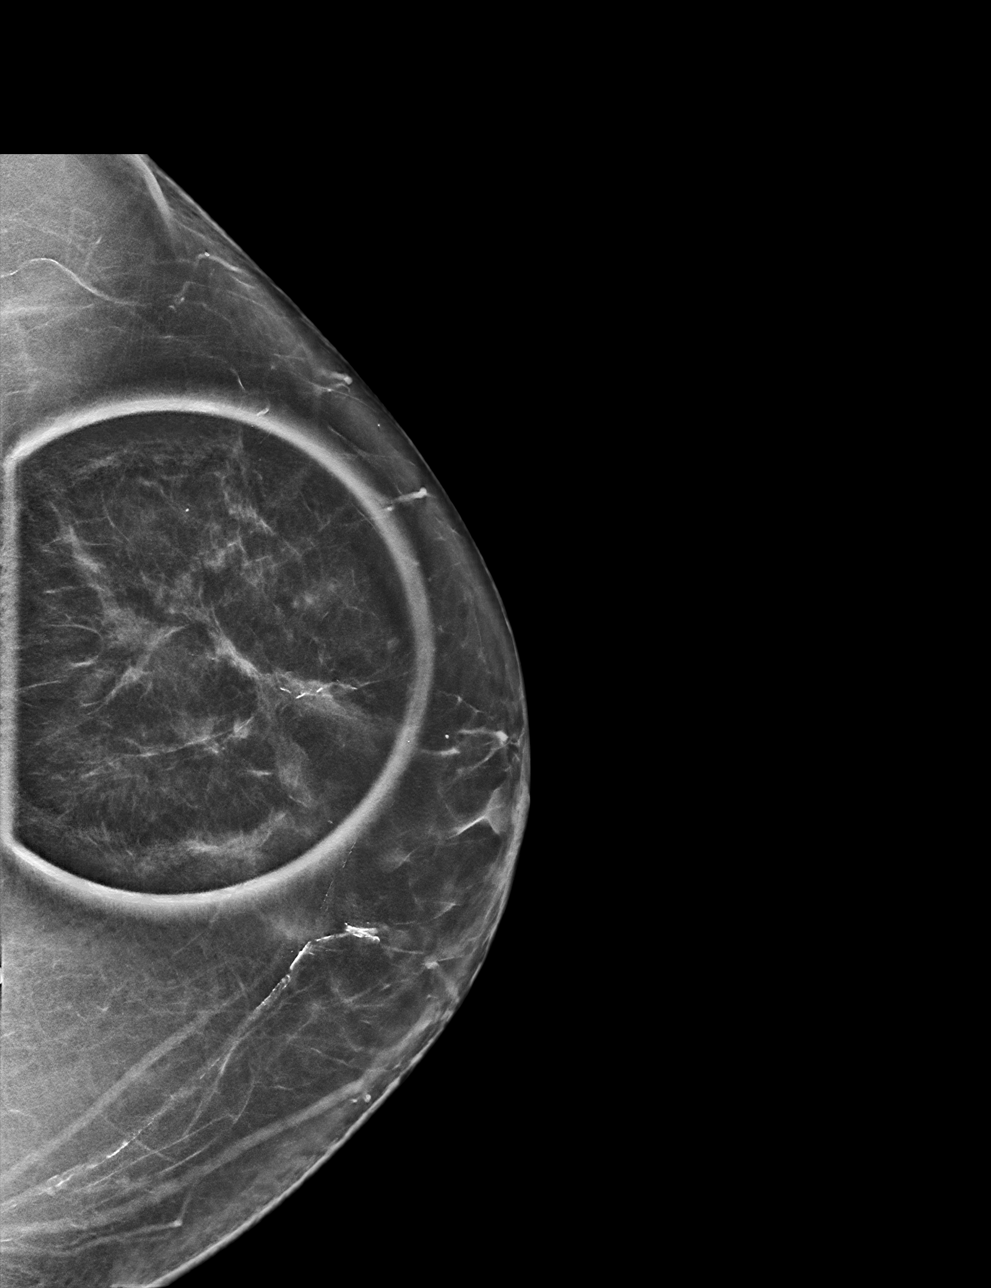

[L MLO synth-2D]
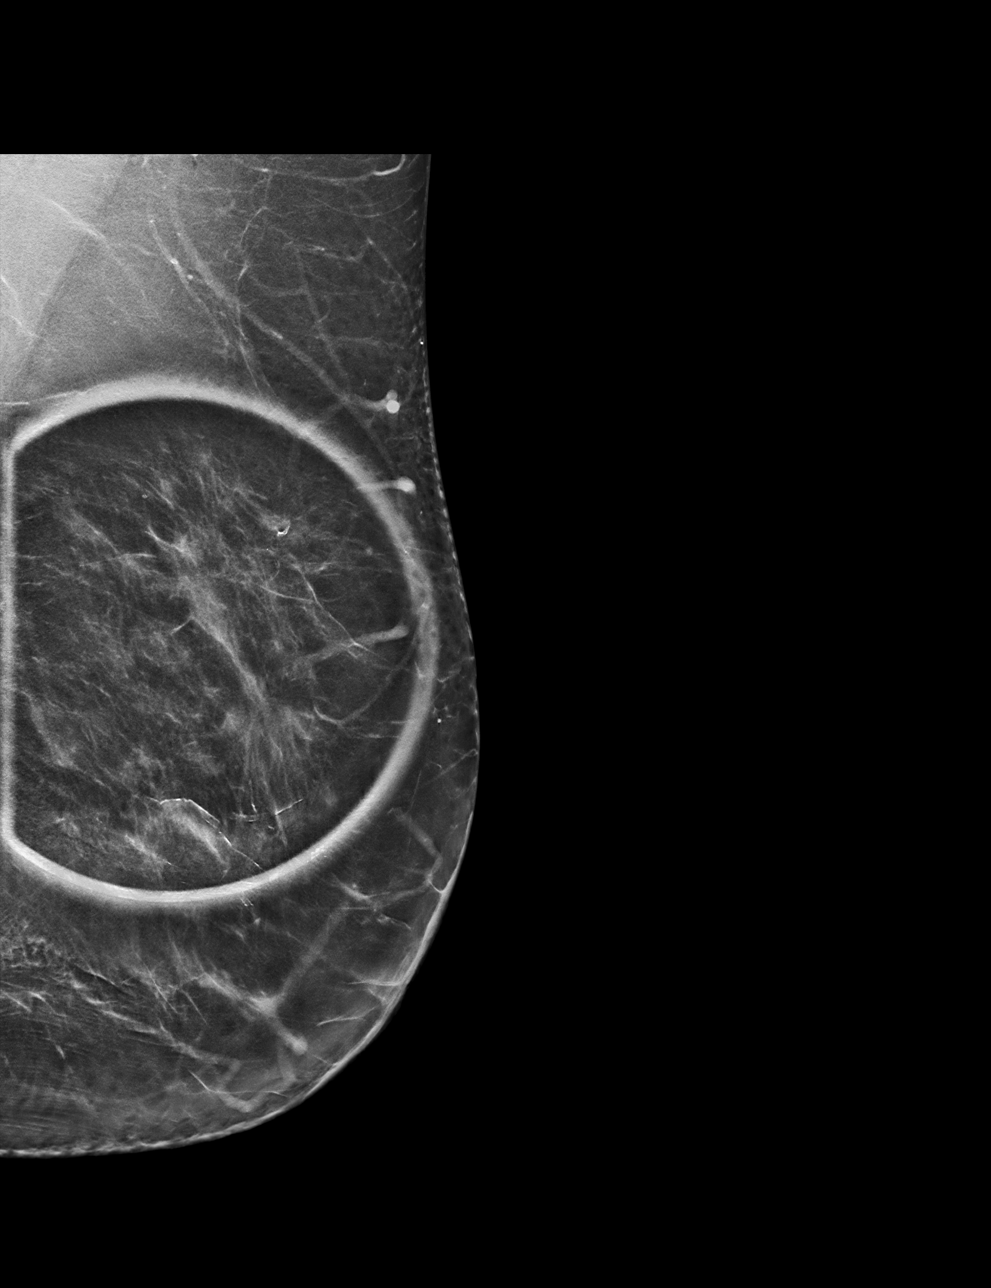

[L CC tomo · tomo slice 35/69.0]
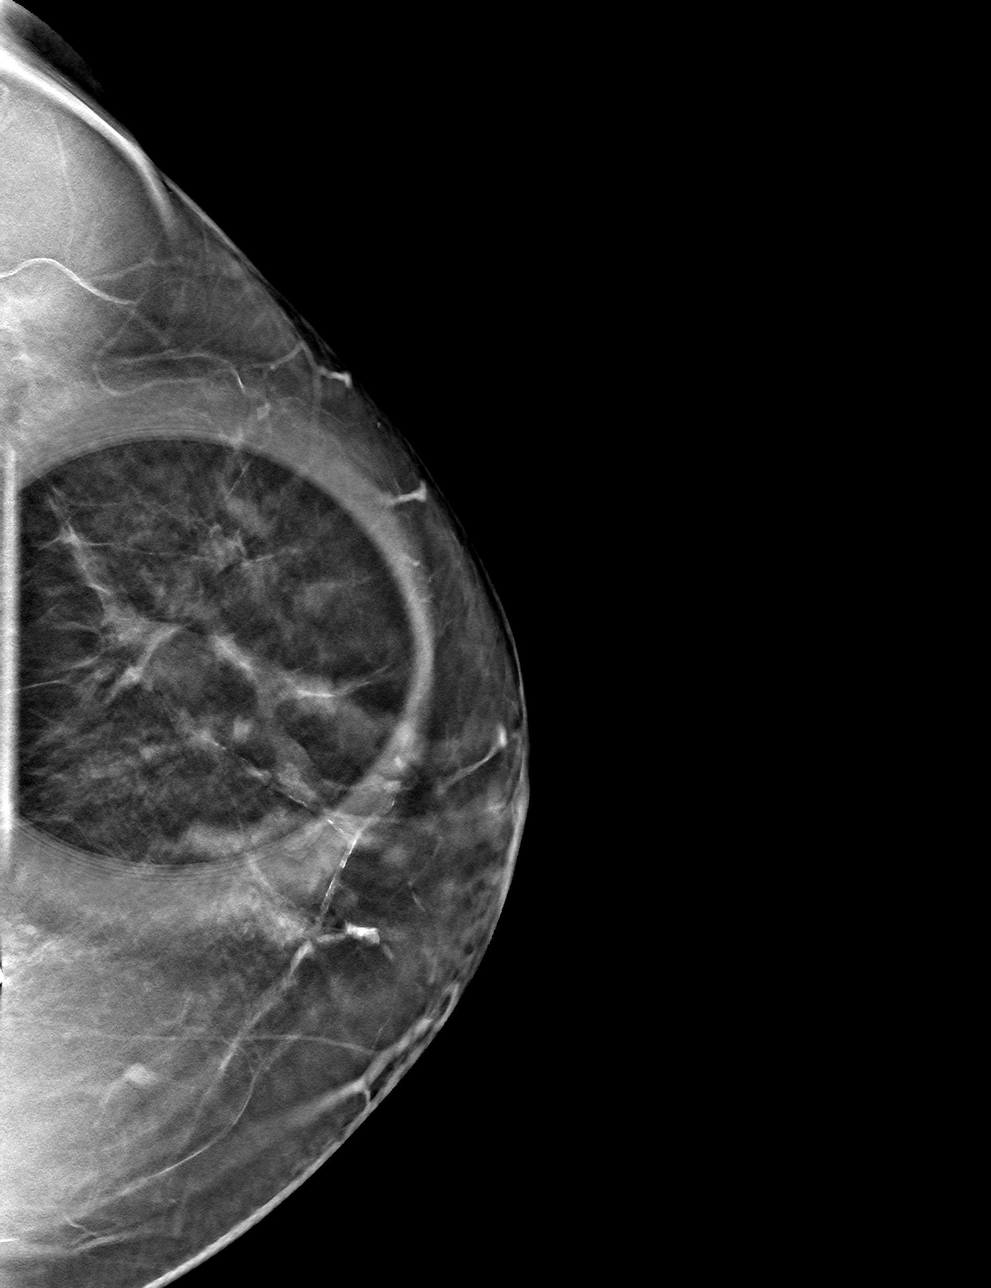

[L MLO tomo · tomo slice 40/79.0]
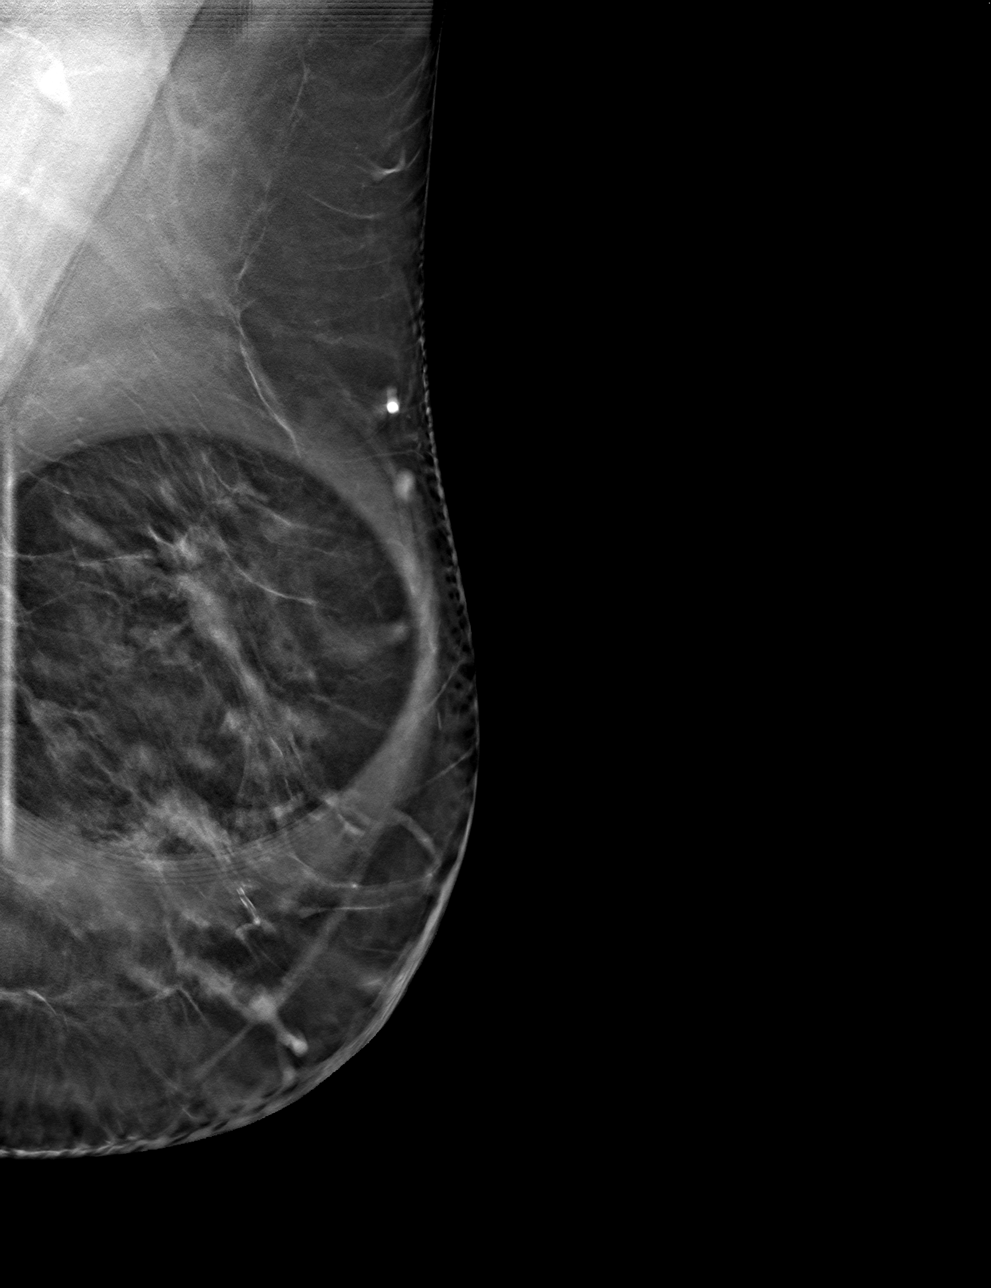

[4 of 12 positions shown; findings below may reference images not displayed]

ACR Breast Density Category b: There are scattered areas of
fibroglandular density.
FINDINGS: Tomosynthesis and synthesized spot-compression CC and MLO views of
the area of concern in the LEFT breast were obtained. The focal
asymmetry questioned in the OUTER LEFT breast at POSTERIOR depth
partially disperses though persists with compression. There is no
visible underlying mass. There may be persistent vague architectural
distortion.

On correlative physical exam, there is palpable thickening in the
UPPER OUTER QUADRANT of the LEFT breast.

Targeted LEFT breast ultrasound is performed, showing a
heterogeneous though predominantly hypoechoic mass with vague
margins at the 1 o'clock position approximately 4 cm from the nipple
measuring approximately 1.0 x 1.1 x 2.1 cm, demonstrating posterior
acoustic shadowing and no internal power Doppler flow, associated
with scattered calcifications, corresponding to the screening
mammographic finding.

Sonographic evaluation of the LEFT axilla demonstrates no pathologic
lymphadenopathy.
IMPRESSION: 1. Suspicious approximate 2.1 cm mass involving the UPPER OUTER
QUADRANT of the LEFT breast which accounts for the screening
mammographic finding.
2. No pathologic LEFT axillary lymphadenopathy.

RECOMMENDATION:
Ultrasound-guided core needle biopsy of the LEFT breast mass.

The ultrasound core needle biopsy procedure was discussed with the
patient and her questions were answered. She has agreed to proceed
and the biopsy has been scheduled for [REDACTED], [DATE].

I have discussed the findings and recommendations with the patient.

BI-RADS CATEGORY  4: Suspicious.

## 2021-03-18 ENCOUNTER — Other Ambulatory Visit: Payer: Self-pay | Admitting: Family Medicine

## 2021-03-19 ENCOUNTER — Ambulatory Visit
Admission: RE | Admit: 2021-03-19 | Discharge: 2021-03-19 | Disposition: A | Discharge status: Home / Self Care | Payer: Medicare Other | Source: Ambulatory Visit | Attending: Family Medicine | Admitting: Family Medicine

## 2021-03-19 ENCOUNTER — Other Ambulatory Visit: Payer: Self-pay

## 2021-03-19 DIAGNOSIS — M4802 Spinal stenosis, cervical region: Secondary | ICD-10-CM | POA: Diagnosis not present

## 2021-03-19 DIAGNOSIS — M5030 Other cervical disc degeneration, unspecified cervical region: Secondary | ICD-10-CM

## 2021-03-21 ENCOUNTER — Telehealth: Payer: Self-pay | Admitting: Pulmonary Disease

## 2021-03-21 DIAGNOSIS — J45909 Unspecified asthma, uncomplicated: Secondary | ICD-10-CM | POA: Diagnosis not present

## 2021-03-21 DIAGNOSIS — G4733 Obstructive sleep apnea (adult) (pediatric): Secondary | ICD-10-CM | POA: Diagnosis not present

## 2021-03-21 NOTE — Telephone Encounter (Signed)
Would not recommend antibiotic unless she is having persistent green/yellow sputum. If it is a stomach bug, then hydration plenty of oral fluids If wheezing increases, let us know. May recommend COVID testing since other family members are sick -unless this is obviously a stomach bug

## 2021-03-21 NOTE — Telephone Encounter (Signed)
Called and spoke with pt who states she has had increased SOB, cough which mainly was a dry cough but then started coughing up yellow phlegm today 4/19. Pt also has been nauseous and did begin to vomit overnight 4/18 multiple times.  Pt said that her granddaughter had the stomach flu last Wednesday 4/13 and now she is having symptoms. States that her daughter and granddaughter live with her.  Pt denies any complaints of fever, states that her temp was 98.7.  Pt has been taking advil to see if that would help. Also did use her phenergan overnight 4/18 and her zofran today 4/19 but even with the nausea meds on board, she is still vomiting.  Pt had to use her rescue inhaler twice yesterday 4/18 and also has had to use it once today 4/19. Pt is using her symbicort as prescribed.  Pt wants to know what we can recommend to help with her symptoms. Dr. Elsworth Soho, please advise.

## 2021-03-21 NOTE — Telephone Encounter (Signed)
Spoke with pt and review Dr. Bari Mantis dictated recommendations. Pt stated understanding. Nothing further needed at this time.

## 2021-03-22 DIAGNOSIS — Z20822 Contact with and (suspected) exposure to covid-19: Secondary | ICD-10-CM | POA: Diagnosis not present

## 2021-03-24 ENCOUNTER — Telehealth: Payer: Self-pay | Admitting: Pharmacist

## 2021-03-24 NOTE — Chronic Care Management (AMB) (Signed)
Chronic Care Management Pharmacy Assistant   Name: Shelby Anderson  MRN: [id number] DOB: March 05, 1956  Reason for Encounter: Chart Review for CPP visit on 03/27/2021   Conditions to be addressed/monitored: CHF, HTN, HLD, DMII, Pulmonary Disease and GERD, Asthma   Recent office visits:  02/27/2021- Lab Visit (PCP)- CMP, Hgb A1C, Lipid, CBC, Microalbumin  02/24/2021- Dr. [name] (PCP)-CPE Zostervax Vaccine given Hypertension: Recently started on Toprol 25 mg daily. Asthma: Symbicort, Flonase, Singulair Diabetes 2: Metformin, glipizide.  We discussed getting back on track with regular exercise. Hyperlipidemia: Continue Lipitor 40 mg Depression:  We added Wellbutrin 150 mg daily. Feels calmer, less stressed out than she did before. Just felt like it was one thing after another. Feels she has handled stressors better. Continue with Wellbutrin 150 mg daily, doxepin grams nightly. GERD:She is on call back list for GI; but otherwise has appointment for summer. Currently taking Pepcid, Carafate, Zofran as needed. Return in about 6 months (around 08/27/2021).  01/06/2021- Dr. [name] (PCP) - Follow up visit.  HTN: Has been well controlled.  Not currently on medications (except Lasix) Mild persistent asthma without complication: Breathing has been stable.  Continue Symbicort, Flonase, Singulair. Hoping pharmacy can assist with cost of medications. AMB Referral to [facility] Gastroesophageal reflux disease without esophagitis:Worsening of reflux.  Instructed her to call GI for follow-up.  Additionally having some food sticking and choking with swallowing.  Overdue for colonoscopy and likely needs EGD as well. Med cost is an issue with GI meds; hoping pharmacy can help.  Type 2 diabetes mellitus without complication, without long-term current use of insulin (HCC):Med cost is a concern for her if any additional medications were needed to help control sugars.  A1c has  been stable on Metformin and glipizide.  We will continue to monitor and address lifestyle behaviors.  She felt better when exercising, so would like to help her get back on track with this. Hyperlipidemia associated with type 2 diabetes mellitus ([place]): Cholesterol has been well controlled on Lipitor 40 mg daily Depression, recurrent ([place]): She has been on doxepin for years.  Depressed mood is worsening.  I think she would benefit from adding Wellbutrin.  And hopefully this will help with motivation and perhaps some appetite control. Return in about 1 month.  09/30/2020- Dr [name] (PCP)- Follow up Visit Mild persistent asthma without complication: Breathing is doing better although she does get some soreness in the back of the throat.  I have sent in a spacing chamber for her to use, which I am hoping will help with medication delivery. Spacer/Aero-Holding Chambers DEVI; 1 Device by Does not apply route daily as needed.   Hypertension associated with diabetes ([place]): Blood pressure stable today. Gastroesophageal reflux disease without esophagitis: 40 mg twice daily, Pepcid 20 mg twice daily, Carafate as directed from GI.  She does still use Zofran if needed. Type 2 diabetes mellitus without complication, without long-term current use of insulin ([place]): Continue with glipizide 5 mg daily, continue Metformin 500 mg twice daily. Hyperlipidemia associated with type 2 diabetes mellitus ([place]): Continue Lipitor 40 mg daily.  We will recheck blood work today. Return in about 3 months (around 12/31/2020) for Chronic condition visit.   Recent consult visits:  03/13/2021- [name], PA-C (Cardiology) Patient presents for 6-week follow-up of hypertension and chronic chest pain syndrome.  At last visit restarted patient on metoprolol succinate 25 mg once daily.  Patient reports she is tolerating metoprolol succinate without issue.  Although she does report occasional episodes of dizziness or  lightheadedness when bending over.  These episodes only last several seconds.  She reports chronic chest pain has improved, and she has not needed nitroglycerin.  Patient denies palpitations, dyspnea, syncope, near-syncope, PND, orthopnea, leg edema.  She continues to walk on a regular basis.  Patient reports home blood pressure readings averaging 110-115/65-70 mmHg. Plan for repeat echocardiogram following next office visit in order to reevaluate left ventricle systolic function. Follow-up in 6 months, sooner if needed, for hypertension and orthostatic hypotension.  02/06/2021- Acquanetta Sit, DPM (Podiatry)-  Follow up of preventative diabetic foot care with and painful mycotic nails b/l that are difficult to trim. Pain interferes with ambulation. Aggravating factors include wearing enclosed shoe gear. Pain is relieved with periodic professional debridement. Bilateral great toe nail plates debrided and curretaged  at medial and lateral nail borders. Both digits cleansed. Antibiotic ointment applied. She was advised to apply Vaseline Petroleum Jelly or Neosporin to skin around toenails daily. Toenails 2, 4, 5 right and 1-5 left were debrided in length and girth without iatrogenic bleeding. Patient to continue soft, supportive shoe gear daily. Return in about 3 months (around 05/09/2021).  02/01/2021- Dr. [name] (Pulmonary)- Follow up Visit Has done well with asthma controlled on Symbicort 80.  We will continue this level for another year and if she does not have any flareups, then we will consider changing Symbicort to an as-needed basis. Stay on symbicort, CPAP is working well. Return in about 6 months (around 08/04/2021) for TP.  01/30/2021- Celeste Cantwell, PA-C (Cardiology)- Follow up visit 65 y.o. 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 Metoprolol 25 mg 1 tablet daily and Lasix 20 mg 1 tablet daily added at visit.  In regard to hypertension, patient's blood pressure in the office is well controlled however she reports home blood pressure readings that are elevated above goal, >130/80 mmHg.  Patient has previously tolerated metoprolol well, which also improved her chronic chest pain symptoms.  Patient does have a history of asthma, however she is not presently wheezing, therefore will restart patient on metoprolol succinate 25 mg once daily. Follow-up in 6 weeks, sooner if needed, for hypertension, chronic chest pain syndrome.  12/13/2020- Dr. 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  Discussed that we could proceed with a subtalar fusion this would make her foot stiff on uneven terrain but should resolve the instability and pain from the subtalar joint.  Risks and benefits were discussed including risk of a wound not healing risk of the skin not healing need for additional surgery.  Patient states she understands and wishes to proceed with outpatient subtalar fusion at this time. Follow-Up Instructions: Return if symptoms worsen or fail to improve, for Follow-up 1 week after surgery  11/07/2020- Acquanetta Sit, DPM (Podiatry)- Follow up visit Preventative diabetic foot care with and painful mycotic nails b/l that are difficult to trim. Pain interferes with ambulation. Aggravating factors include wearing enclosed shoe  gear. Pain is relieved with  periodic professional debridement. Right great toe is tender on today's visit. Right great toe nail plate debrided and curretaged. Distal edge cleaned. Left hallux debrided and curretaged at medial and lateral nail borders. Both digits cleansed. Antibiotic ointment applied. She was advised to apply Vaseline Petroleum Jelly to skin around toenails. Toenails 2, 4, 5 right and 1-5 left were debrided in length and girth without iatrogenic bleeding.Patient to continue soft, supportive shoe gear daily.Patient to report any pedal injuries to medical professional immediately.Return in about 3 months (around 02/05/2021) for diabetic foot care.  10/26/2020- Dr. [name] (Pain Medicine)- Notes unavailable   Hospital visits:  None in previous 6 months   Medications: Outpatient Encounter Medications as of 03/24/2021  Medication Sig  . albuterol (PROVENTIL) (2.5 MG/3ML) 0.083% nebulizer solution Take 3 mLs (2.5 mg total) by nebulization every 6 (six) hours as needed for wheezing or shortness of breath (J45.40). (Patient taking differently: Take 2.5 mg by nebulization every 6 (six) hours as needed for wheezing or shortness of breath.)  . albuterol (VENTOLIN HFA) 108 (90 Base) MCG/ACT inhaler Inhale 2 puffs into the lungs every 6 (six) hours as needed for wheezing or shortness of breath.  Marland Kitchen aspirin EC 81 MG tablet Take 81 mg at bedtime by mouth.   Marland Kitchen atorvastatin (LIPITOR) 40 MG tablet TAKE 1 TABLET BY MOUTH EVERYDAY AT BEDTIME. **DUE FOR YEARLY PHYSICAL**  . Blood Glucose Monitoring Suppl (ONE TOUCH ULTRA 2) w/Device KIT USE TO CHECK BLOOD SUGAR 2 TIMES A DAY  . budesonide-formoterol (SYMBICORT) 80-4.5 MCG/ACT inhaler INHALE 2 PUFFS INTO THE LUNGS EVERY MORNING AND ANOTHER 2 PUFFS 12 HOURS LATER  . buPROPion (WELLBUTRIN XL) 150 MG 24 hr tablet Take 1 tablet (150 mg total) by mouth daily.  . Calcium Carbonate-Vitamin D 600-200 MG-UNIT TABS Take 1 tablet by mouth daily.  Marland Kitchen CINNAMON PO Take 1,000 mg 2 (two) times  daily by mouth.  . clobetasol cream (TEMOVATE) 0.05 % Apply topically 2 (two) times daily. (Patient taking differently: Apply topically daily as needed (rash).)  . doxepin (SINEQUAN) 10 MG capsule Take 20 mg by mouth at bedtime.   . famotidine (PEPCID) 20 MG tablet TAKE 1 TABLET BY MOUTH 2 TIMES DAILY AS NEEDED FOR HEARTBURN OR INDIGESTION.  . fluticasone (FLONASE) 50 MCG/ACT nasal spray Place 2 sprays at bedtime as needed into both nostrils for allergies or rhinitis.   . furosemide (LASIX) 20 MG tablet Take 1 tablet (20 mg total) by mouth daily as needed for fluid.  Marland Kitchen glipiZIDE (GLUCOTROL XL) 5 MG 24 hr tablet TAKE 1 TABLET BY MOUTH EVERYDAY WITH BREAKFAST. **DUE FOR YEARLY PHYSICAL**  . L-Methylfolate (DEPLIN) 7.5 MG TABS Take 7.5 mg by mouth daily with breakfast.  . Lancets (ONETOUCH DELICA PLUS [id number]) MISC USE TO CHECK BLOOD SUGAR 2 TIMES A DAY  . levothyroxine (SYNTHROID) 50 MCG tablet TAKE 1 TABLET BY MOUTH EVERY DAY  . LYRICA 150 MG capsule Take 150 mg by mouth 3 (three) times daily.   . metFORMIN (GLUCOPHAGE) 500 MG tablet TAKE 1 TABLET BY MOUTH TWICE A DAY WITH MEALS  . metoprolol succinate (TOPROL-XL) 25 MG 24 hr tablet Take 1 tablet (25 mg total) by mouth daily. Take with or immediately following a meal.  . Misc Natural Products (GLUCOS-CHONDROIT-MSM COMPLEX PO) Take 1 tablet 3 (three) times daily by mouth.   . mometasone (ELOCON) 0.1 % cream Apply topically.  . montelukast (SINGULAIR) 10 MG tablet TAKE 1 TABLET BY MOUTH  EVERYDAY AT BEDTIME  . Multiple Vitamin (MULITIVITAMIN WITH MINERALS) TABS Take 1 tablet by mouth daily.  . mupirocin cream (BACTROBAN) 2 % Apply once daily to lesions on feet as needed.  Marland Kitchen NEXIUM 40 MG capsule TAKE 1 CAPSULE BY MOUTH TWICE A DAY  . nitroGLYCERIN (NITROSTAT) 0.4 MG SL tablet Place 0.4 mg under the tongue every 5 (five) minutes as needed for chest pain.   Marland Kitchen ondansetron (ZOFRAN) 4 MG tablet TAKE 1 TABLET (4 MG TOTAL) BY MOUTH EVERY 4 (FOUR) HOURS AS  NEEDED FOR NAUSEA OR VOMITING.  Glory Rosebush ULTRA test strip USE TO CHECK BLOOD SUGAR 2 TIMES A DAY  . Oxycodone HCl 20 MG TABS Take 20 mg every 4 (four) hours as needed by mouth (pain).   . potassium chloride (KLOR-CON) 10 MEQ tablet TAKE 2 TABLETS BY MOUTH EVERY DAY  . promethazine (PHENERGAN) 25 MG tablet TAKE 1 TABLET (25 MG TOTAL) BY MOUTH EVERY 6 (SIX) HOURS AS NEEDED FOR NAUSEA OR VOMITING.  Marland Kitchen Spacer/Aero-Holding Chambers DEVI 1 Device by Does not apply route daily as needed.  . sucralfate (CARAFATE) 1 g tablet DISSOLVE 1 TAB IN 1 TABLESPOON OF DISTILLED WATER FOR 15 MINS. TAKE 4XDAILY WITH MEALS & AT BEDTIME  . tiZANidine (ZANAFLEX) 4 MG tablet Take 4 mg by mouth every 8 (eight) hours as needed for muscle spasms.  . vitamin B-12 (CYANOCOBALAMIN) 500 MCG tablet Take 500 mcg by mouth daily.   . vitamin E 200 UNIT capsule Take 200 Units by mouth daily.   No facility-administered encounter medications on file as of 03/24/2021.    Star Rating Drugs: Atorvastatin 40 mg- Last filled 01/25/2021 for 30 day supply at [facility] Glipizide ER 5 mg- Last filled 03/12/2021 for 30 day supply at [facility] Metformin 500 mg- Last filled 03/12/2021 for 30 day supply at [facility].   Note: Unable to speak with patient for initial questions, called twice, left message to return call and remind of appointment. [name], CPP notified.  SIG: Pattricia Boss, [place] Pharmacist Assistant [phone]

## 2021-03-27 ENCOUNTER — Ambulatory Visit: Payer: Medicare Other

## 2021-03-27 NOTE — Progress Notes (Deleted)
Chronic Care Management Pharmacy Note  03/27/2021 Name:  ANECIA NUSBAUM MRN:  [id number] DOB:  [birth year]  Subjective: Shelby Anderson is an 65 y.o. year old female who is a primary patient of Koberlein, Steele Berg, MD.  The CCM team was consulted for assistance with disease management and care coordination needs.    Engaged with patient face to face for initial visit in response to provider referral for pharmacy case management and/or care coordination services.   Consent to Services:  The patient was given the following information about Chronic Care Management services today, agreed to services, and gave verbal consent: 1. CCM service includes personalized support from designated clinical staff supervised by the primary care provider, including individualized plan of care and coordination with other care providers 2. 24/7 contact phone numbers for assistance for urgent and routine care needs. 3. Service will only be billed when office clinical staff spend 20 minutes or more in a month to coordinate care. 4. Only one practitioner may furnish and bill the service in a calendar month. 5.The patient may stop CCM services at any time (effective at the end of the month) by phone call to the office staff. 6. The patient will be responsible for cost sharing (co-pay) of up to 20% of the service fee (after annual deductible is met). Patient agreed to services and consent obtained.  Patient Care Team: Caren Macadam, MD as PCP - General (Family Medicine) Rigoberto Noel, MD as Consulting Physician (Pulmonary Disease) Adrian Prows, MD as Consulting Physician (Cardiology) Lenon Oms, MD as Referring Physician (Obstetrics and Gynecology) Veneda Melter, MD as Referring Physician (Cardiology) [name], MD as Consulting Physician (Anesthesiology) Rolm Bookbinder, MD as Consulting Physician (General Surgery) Nicholas Lose, MD as Consulting Physician (Hematology and  Oncology) Kyung Rudd, MD as Consulting Physician (Radiation Oncology) Alda Berthold, DO as Consulting Physician (Neurology) Jarome Matin, MD as Consulting Physician (Dermatology) Viona Gilmore, [facility] as Pharmacist (Pharmacist)  Recent office visits: 02/27/2021- Lab Visit (PCP)- CMP, Hgb A1C, Lipid, CBC, Microalbumin  02/24/2021- Dr. [name] (PCP)-CPE Shingrix vaccine prescription given. Prevnar 13 administered.   01/06/2021- Dr. [name] (PCP) - Follow up visit. Prescribed Wellbutrin 150 mg daily.  09/30/2020- Dr [name] (PCP)- Follow up Visit  Recent consult visits: 03/13/2021- [name], PA-C (Cardiology): Patient presents for 6-week follow-up of hypertension and chronic chest pain syndrome. At last visit restarted patient on metoprolol succinate 25 mg once daily. Plan for repeat echocardiogram following next office visit.  02/06/2021- Acquanetta Sit, DPM (Podiatry)- Follow up of preventative diabetic foot care with and painful mycotic nails b/l that are difficult to trim.   02/01/2021- Dr. [name] (Pulmonary)- Follow up Visit Has done well with asthma controlled on Symbicort 80. If she does not have any flareups, then we will consider changing Symbicort to an as-needed basis.  01/30/2021- [name], PA-C (Cardiology)- Follow up visit. Medications discontinued during visit Deplin 7.5 mg, Oxygen and Lasix 20 mg dose change. Metoprolol 25 mg 1 tablet daily and Lasix 20 mg 1 tablet daily added at visit.   12/13/2020- Dr. Nicholas Lose (Oncology)- Follow-up of triple negative left breast cancer Breast exam: Benign, Return to clinic in1 year for follow-up  11/23/2020- 12/27/2019- Dr. [name] (Pain Medicine)- Notes unavailable  11/14/2020- Dr Meridee Score (Orthopedics)- Initial visit. Plan to follow up post surgery.  11/07/2020- Acquanetta Sit, DPM (Podiatry)- Follow up visit Preventative diabetic foot care with and painful mycotic nails  b/l that are difficult  to trim.   10/26/2020- Dr. [name] (Pain Medicine)- Notes unavailable.  Hospital visits: None in previous 6 months  Objective:  Lab Results  Component Value Date   CREATININE 0.65 02/27/2021   BUN 12 02/27/2021   GFR 94.01 02/27/2021   GFRNONAA >60 12/08/2019   GFRAA >60 12/08/2019   NA 142 02/27/2021   K 4.1 02/27/2021   CALCIUM 9.0 02/27/2021   CO2 34 (H) 02/27/2021   GLUCOSE 89 02/27/2021    Lab Results  Component Value Date/Time   HGBA1C 7.4 (H) 02/27/2021 01:00 PM   HGBA1C 7.2 (A) 01/06/2021 04:54 PM   HGBA1C 7.2 (H) 09/30/2020 03:33 PM   HGBA1C 5.9 01/12/2017 12:00 AM   GFR 94.01 02/27/2021 01:00 PM   GFR 96.05 04/10/2019 09:44 AM   MICROALBUR 1.1 02/27/2021 01:00 PM   MICROALBUR 0.9 07/24/2018 04:53 PM    Last diabetic Eye exam:  Lab Results  Component Value Date/Time   HMDIABEYEEXA No Retinopathy 07/22/2018 12:00 AM    Last diabetic Foot exam: No results found for: HMDIABFOOTEX   Lab Results  Component Value Date   CHOL 119 02/27/2021   HDL 31.00 (L) 02/27/2021   LDLCALC 51 02/27/2021   TRIG 183.0 (H) 02/27/2021   CHOLHDL 4 02/27/2021    Hepatic Function Latest Ref Rng & Units 02/27/2021 09/30/2020 12/08/2019  Total Protein 6.0 - 8.3 g/dL 6.5 7.5 6.6  Albumin 3.5 - 5.2 g/dL 3.7 - 3.3(L)  AST 0 - 37 U/L _0 ALT 0 - 35 U/L _1 Alk Phosphatase 39 - 117 U/L 68 - 63  Total Bilirubin 0.2 - 1.2 mg/dL 0.4 0.5 0.3  Bilirubin, Direct 0.0 - 0.3 mg/dL - - -    Lab Results  Component Value Date/Time   TSH 2.99 12/07/2020 02:33 PM   TSH 6.50 (H) 09/30/2020 03:33 PM   FREET4 1.2 09/30/2020 03:33 PM   FREET4 0.81 04/10/2016 03:11 PM    CBC Latest Ref Rng & Units 02/27/2021 09/30/2020 12/08/2019  WBC 4.0 - 10.5 K/uL 5.5 5.5 5.7  Hemoglobin 12.0 - 15.0 g/dL 12.6 13.7 12.5  Hematocrit 36.0 - 46.0 % 38.1 42.8 40.9  Platelets 150.0 - 400.0 K/uL 222.0 271 265    Lab Results  Component Value Date/Time   VD25OH 57  09/30/2020 03:33 PM    Clinical ASCVD: {YES/NO:21197} The ASCVD Risk score Mikey Bussing DC Jr., et al., 2013) failed to calculate for the following reasons:   The patient has a prior MI or stroke diagnosis    Depression screen [facility] 2/9 02/24/2021 01/06/2021 12/31/2019  Decreased Interest _2 Down, Depressed, Hopeless _3 PHQ - 2 Score _4 Altered sleeping _5 Tired, decreased energy _6 Change in appetite _7 Feeling bad or failure about yourself  0 0 2  Trouble concentrating 1 0 0  Moving slowly or fidgety/restless 0 0 0  Suicidal thoughts 0 0 0  PHQ-9 Score _8 Difficult doing work/chores - - -  Some recent data might be hidden     ***Other: (CHADS2VASc if Afib, MMRC or CAT for COPD, ACT, DEXA)  Social History   Tobacco Use  Smoking Status Never Smoker  Smokeless Tobacco Never Used   BP Readings from Last 3 Encounters:  03/13/21 116/65  02/24/21 112/80  02/01/21 110/80   Pulse Readings from Last 3 Encounters:  03/13/21 65  02/24/21 68  02/01/21 89   Wt Readings from Last 3 Encounters:  03/13/21 213 lb (96.6 kg)  02/24/21 219 lb 9.6 oz (99.6 kg)  02/01/21 215 lb 9.6 oz (97.8 kg)   BMI Readings from Last 3 Encounters:  03/13/21 36.56 kg/m  02/24/21 36.83 kg/m  02/01/21 34.80 kg/m    Assessment/Interventions: Review of patient past medical history, allergies, medications, health status, including review of consultants reports, laboratory and other test data, was performed as part of comprehensive evaluation and provision of chronic care management services.   SDOH:  (Social Determinants of Health) assessments and interventions performed: {yes/no:20286}  SDOH Screenings   Alcohol Screen: Not on file  Depression (PHQ2-9): Medium Risk  . PHQ-2 Score: 6  Financial Resource Strain: Not on file  Food Insecurity: Not on file  Housing: Not on file  Physical Activity: Not on file  Social Connections: Not on file  Stress: Not on file  Tobacco Use:  Low Risk   . Smoking Tobacco Use: Never Smoker  . Smokeless Tobacco Use: Never Used  Transportation Needs: Not on file    CCM Care Plan  Allergies  Allergen Reactions  . Lodine [Etodolac] Anaphylaxis, Hives and Swelling  . Oxycontin [Oxycodone Hcl] Anaphylaxis    hives, trouble breathing, tongue swelling (Only Oxycontin) Tolerates plain oxycodone.  Marland Kitchen Penicillins Anaphylaxis    Told by a surgeon never to take it again. Has patient had a PCN reaction causing immediate rash, facial/tongue/throat swelling, SOB or lightheadedness with hypotension: Yes Has patient had a PCN reaction causing severe rash involving mucus membranes or skin necrosis: Unknown Has patient had a PCN reaction that required hospitalization: No Has patient had a PCN reaction occurring within the last 10 years: No If all of the above answers are "NO", then may proceed with Cephalosporin use.  . Aspirin Other (See Comments)    High-dose caused GI Bleeds  . Darvocet [Propoxyphene N-Acetaminophen] Hives  . Nitroglycerin Other (See Comments)    IV-BP drops dramatically Can take po  . Tramadol Hives and Itching  . Ultram [Tramadol Hcl] Hives  . Valium Other (See Comments)    Circulation problems. "Legs turned black".    Medications Reviewed Today    Reviewed by Verneda Skill (Physician Assistant Certified) on 73/71/06 at 1508  Med List Status: <None>  Medication Order Taking? Sig Documenting Provider Last Dose Status Informant  albuterol (PROVENTIL) (2.5 MG/3ML) 0.083% nebulizer solution [id number] Yes Take 3 mLs (2.5 mg total) by nebulization every 6 (six) hours as needed for wheezing or shortness of breath (J45.40).  Patient taking differently: Take 2.5 mg by nebulization every 6 (six) hours as needed for wheezing or shortness of breath.   Javier Glazier, MD Taking Active   albuterol (VENTOLIN HFA) 108 (90 Base) MCG/ACT inhaler [id number] Yes Inhale 2 puffs into the lungs every 6 (six) hours as needed  for wheezing or shortness of breath. Parrett, Fonnie Mu, NP Taking Active   aspirin EC 81 MG tablet [id number] Yes Take 81 mg at bedtime by mouth.  [provider] Taking Active Self  atorvastatin (LIPITOR) 40 MG tablet [id number] Yes TAKE 1 TABLET BY MOUTH EVERYDAY AT BEDTIME. **DUE FOR YEARLY PHYSICAL** Koberlein, Junell C, MD Taking Active   Blood Glucose Monitoring Suppl (ONE TOUCH ULTRA 2) w/Device KIT [id number] Yes USE TO CHECK BLOOD SUGAR 2 TIMES A DAY Koberlein, Junell C, MD Taking Active Self  budesonide-formoterol (SYMBICORT) 80-4.5 MCG/ACT inhaler [id number] Yes INHALE 2 PUFFS INTO THE LUNGS EVERY MORNING AND  ANOTHER 2 PUFFS 12 HOURS LATER Parrett, Tammy S, NP Taking Active   buPROPion (WELLBUTRIN XL) 150 MG 24 hr tablet [id number] Yes Take 1 tablet (150 mg total) by mouth daily. Caren Macadam, MD Taking Active   Calcium Carbonate-Vitamin D 600-200 MG-UNIT TABS [id number] Yes Take 1 tablet by mouth daily. [provider] Taking Active Self  CINNAMON PO [id number] Yes Take 1,000 mg 2 (two) times daily by mouth. [provider] Taking Active Self  clobetasol cream (TEMOVATE) 0.05 % [id number] Yes Apply topically 2 (two) times daily.  Patient taking differently: Apply topically daily as needed (rash).   Georgette Shell, MD Taking Active   doxepin [facility]) 10 MG capsule [id number] Yes Take 20 mg by mouth at bedtime.  [provider] Taking Active Self  famotidine (PEPCID) 20 MG tablet [id number] Yes TAKE 1 TABLET BY MOUTH 2 TIMES DAILY AS NEEDED FOR HEARTBURN OR INDIGESTION. Irene Shipper, MD Taking Active   fluticasone [facility]) 50 MCG/ACT nasal spray [id number] Yes Place 2 sprays at bedtime as needed into both nostrils for allergies or rhinitis.  [provider] Taking Active Self  furosemide (LASIX) 20 MG tablet [id number] Yes Take 1 tablet (20 mg total) by mouth daily as needed for fluid. Cantwell, Celeste C, PA-C Taking Active   glipiZIDE  (GLUCOTROL XL) 5 MG 24 hr tablet [id number] Yes TAKE 1 TABLET BY MOUTH EVERYDAY WITH BREAKFAST. **DUE FOR YEARLY PHYSICAL** Koberlein, Junell C, MD Taking Active   L-Methylfolate (DEPLIN) 7.5 MG TABS [id number] Yes Take 7.5 mg by mouth daily with breakfast. [provider] Taking Active Self  Lancets [facility] DELICA PLUS [id number]) MISC [id number] Yes USE TO CHECK BLOOD SUGAR 2 TIMES A DAY Koberlein, Junell C, MD Taking Active   levothyroxine (SYNTHROID) 50 MCG tablet [id number] Yes TAKE 1 TABLET BY MOUTH EVERY DAY Koberlein, Junell C, MD Taking Active   LYRICA 150 MG capsule [id number] Yes Take 150 mg by mouth 3 (three) times daily.  [provider] Taking Active Self  metFORMIN (GLUCOPHAGE) 500 MG tablet [id number] Yes TAKE 1 TABLET BY MOUTH TWICE A DAY WITH MEALS Koberlein, Junell C, MD Taking Active   metoprolol succinate (TOPROL-XL) 25 MG 24 hr tablet [id number] Yes Take 1 tablet (25 mg total) by mouth daily. Take with or immediately following a meal. Cantwell, Celeste C, PA-C Taking Active   Misc Natural Products (GLUCOS-CHONDROIT-MSM COMPLEX PO) [id number] Yes Take 1 tablet 3 (three) times daily by mouth.  [provider] Taking Active Self  mometasone (ELOCON) 0.1 % cream [id number] Yes Apply topically. [provider] Taking Active   montelukast (SINGULAIR) 10 MG tablet [id number] Yes TAKE 1 TABLET BY MOUTH EVERYDAY AT BEDTIME Parrett, Fonnie Mu, NP Taking Active   Multiple Vitamin (MULITIVITAMIN WITH MINERALS) TABS [id number] Yes Take 1 tablet by mouth daily. [provider] Taking Active Self  mupirocin cream (BACTROBAN) 2 % [id number] Yes Apply once daily to lesions on feet as needed. Marzetta Board, DPM Taking Active   NEXIUM 40 MG capsule [id number] Yes TAKE 1 CAPSULE BY MOUTH TWICE A DAY Irene Shipper, MD Taking Active   nitroGLYCERIN (NITROSTAT) 0.4 MG SL tablet [id number] Yes Place 0.4 mg under the tongue every 5 (five) minutes as needed for chest pain.   [provider] Taking Active Self  ondansetron (ZOFRAN) 4 MG tablet [id number] Yes TAKE 1 TABLET (4 MG TOTAL) BY MOUTH EVERY 4 (FOUR) HOURS AS NEEDED FOR NAUSEA OR VOMITING. Irene Shipper, MD Taking Active  ONETOUCH ULTRA test strip [id number] Yes USE TO CHECK BLOOD SUGAR 2 TIMES A DAY Koberlein, Junell C, MD Taking Active   Oxycodone HCl 20 MG TABS [id number] Yes Take 20 mg every 4 (four) hours as needed by mouth (pain).  [provider] Taking Active Self  potassium chloride (KLOR-CON) 10 MEQ tablet [id number] Yes TAKE 2 TABLETS BY MOUTH EVERY DAY Koberlein, Junell C, MD Taking Active   promethazine (PHENERGAN) 25 MG tablet [id number] Yes TAKE 1 TABLET (25 MG TOTAL) BY MOUTH EVERY 6 (SIX) HOURS AS NEEDED FOR NAUSEA OR VOMITING. Irene Shipper, MD Taking Active   Spacer/Aero-Holding [place] DEVI [id number] Yes 1 Device by Does not apply route daily as needed. Caren Macadam, MD Taking Active   sucralfate (CARAFATE) 1 g tablet [id number] Yes DISSOLVE 1 TAB IN 1 TABLESPOON OF DISTILLED WATER FOR 15 MINS. TAKE 4XDAILY WITH MEALS & AT BEDTIME Irene Shipper, MD Taking Active   tiZANidine (ZANAFLEX) 4 MG tablet [id number] Yes Take 4 mg by mouth every 8 (eight) hours as needed for muscle spasms. [provider] Taking Active Self  vitamin B-12 (CYANOCOBALAMIN) 500 MCG tablet [id number] Yes Take 500 mcg by mouth daily.  [provider] Taking Active Self  vitamin E 200 UNIT capsule [id number] Yes Take 200 Units by mouth daily. [provider] Taking Active           Patient Active Problem List   Diagnosis Date Noted  . Acute on chronic diastolic CHF (congestive heart failure) ([place]) 10/01/2019  . Neutropenia with fever ([place]) 10/01/2019  . Genetic testing 09/17/2019  . Family history of melanoma   . Family history of pancreatic cancer   . Malignant neoplasm of upper-outer quadrant of left breast in female, estrogen receptor negative ([place]) 09/07/2019  .  Encounter for loop recorder at end of battery life 04/24/2018  . Esophageal stricture 07/01/2017  . Hyperlipidemia associated with type 2 diabetes mellitus ([place]) 05/20/2017  . Allergic rhinitis 03/11/2017  . Dilated cardiomyopathy ([place]) 02/07/2017  . 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 Morbid obesity due to excess calories ([place]) 09/26/2016  . S/P left TKA 09/25/2016  . Increased endometrial stripe thickness 06/27/2016  . Intramural leiomyoma of uterus 06/27/2016  . Type 2 diabetes mellitus without complication, without long-term current use of insulin ([place]) 12/06/2015  . Hypertension associated with diabetes ([place]) 07/19/2015  . Arrhythmia 07/19/2015  . Orthostatic hypotension 07/19/2015  . GERD (gastroesophageal reflux disease) 07/19/2015  . Chronic back pain 07/19/2015  . Neuropathy 07/19/2015  . Symptomatic PVCs 11/02/2014  . Syncope 11/02/2014  . Chest pain 12/23/2013  . Disorder of cervix 03/10/2013  . Vaginal atrophy 03/10/2013  . OSA (obstructive sleep apnea) 07/31/2012  . Asthma 04/29/2012  . Coronary artery disease 11/20/2011    Immunization History  Administered Date(s) Administered  . Influenza Split 08/19/2011  . Influenza Whole 01/03/2013  . Influenza,inj,Quad PF,6+ Mos 08/20/2013, 09/15/2014, 08/26/2015, 08/01/2016, 08/22/2017, 10/15/2018, 09/24/2019, 12/07/2020  . Influenza-Unspecified 08/03/2016  . Moderna Sars-Covid-2 Vaccination 03/11/2020, 04/08/2020, 08/03/2020  . Pneumococcal Conjugate-13 02/24/2021  . Pneumococcal Polysaccharide-23 08/19/2011, 12/12/2017  . Tdap 06/28/2014    Conditions to be addressed/monitored:  Hypertension, Hyperlipidemia, Diabetes, Heart Failure, GERD, Asthma, Hypothyroidism, Depression and Neuropathy, Back pain  There are no care plans that you recently modified to display for this patient.   Current Barriers:  . {pharmacybarriers:24917}  Pharmacist Clinical Goal(s):  Marland Kitchen Patient will  {PHARMACYGOALCHOICES:24921} through collaboration with PharmD and provider.   Interventions: .  1:1 collaboration with Caren Macadam, MD regarding development and update of comprehensive plan of care as evidenced by provider attestation and co-signature . Inter-disciplinary care team collaboration (see longitudinal plan of care) . Comprehensive medication review performed; medication list updated in electronic medical record  BP Readings from Last 3 Encounters:  03/13/21 116/65  02/24/21 112/80  02/01/21 110/80    Hypertension (BP goal <130/80) -Controlled -Current treatment: . Metoprolol succinate 25 mg 1 tablet daily -Medications previously tried: ***  -Current home readings: *** -Current dietary habits: *** -Current exercise habits: *** -{ACTIONS;DENIES/REPORTS:21021675::"Denies"} hypotensive/hypertensive symptoms -Educated on {CCM BP Counseling:25124} -Counseled to monitor BP at home ***, document, and provide log at future appointments -{CCMPHARMDINTERVENTION:25122}  Lab Results  Component Value Date   CHOL 119 02/27/2021   HDL 31.00 (L) 02/27/2021   LDLCALC 51 02/27/2021   TRIG 183.0 (H) 02/27/2021   CHOLHDL 4 02/27/2021    Hyperlipidemia: (LDL goal < 70) -Controlled -Current treatment: . Atorvastatin 40 mg 1 tablet daily -Medications previously tried: ***  -Current dietary patterns: *** -Current exercise habits: *** -Educated on {CCM HLD Counseling:25126} -{CCMPHARMDINTERVENTION:25122}   CAD (Goal: ***) -{US controlled/uncontrolled:25276} -Current treatment  . Aspirin 81 mg 1 tablet daily . Atorvastatin 40 mg 1 tablet daily . Nitroglycerin 0.4 mg 1 tablet SL as needed -Medications previously tried: ***  -{CCMPHARMDINTERVENTION:25122}    Lab Results  Component Value Date   HGBA1C 7.4 (H) 02/27/2021    Diabetes (A1c goal <7%) -Uncontrolled -Current medications: Marland Kitchen Glipizide XL 5 mg 1 tablet daily . Metformin 500 mg 1 tablet twice  daily -Medications previously tried: ***  -Current home glucose readings . fasting glucose: *** . post prandial glucose: *** -{ACTIONS;DENIES/REPORTS:21021675::"Denies"} hypoglycemic/hyperglycemic symptoms -Current meal patterns:  . breakfast: ***  . lunch: ***  . dinner: *** . snacks: *** . drinks: *** -Current exercise: *** -Educated on {CCM DM COUNSELING:25123} -Counseled to check feet daily and get yearly eye exams -{CCMPHARMDINTERVENTION:25122}  Heart Failure (Goal: manage symptoms and prevent exacerbations) -{US controlled/uncontrolled:25276} -Last ejection fraction: *** (Date: ***) -HF type: {type of heart failure:30421350} -NYHA Class: {CHL HP Upstream Pharm NYHA Class:[phone]} -AHA HF Stage: {CHL HP Upstream Pharm AHA HF Stage:[phone]} -Current treatment:  Metoprolol succinate 25 mg 1 tablet daily  Furosemide 20 mg 1 tablet daily as needed  Potassium chloride 10 mEq 2 tablets daily -Medications previously tried: ***  -Current home BP/HR readings: *** -Current dietary habits: *** -Current exercise habits: *** -Educated on {CCM HF Counseling:25125} -{CCMPHARMDINTERVENTION:25122}  Asthma (Goal: control symptoms) -{US controlled/uncontrolled:25276} -Current treatment  . Symbicort 80-4.5 mcg/act 2 puffs twice daily - taking?  . Montelukast 10 mg 1 tablet daily . Albuterol HFA 1 puff as needed . Albuterol nebulizer use as needed -Medications previously tried: ***  -Gold Grade: {CHL HP Upstream Pharm COPD Gold MWUXL:[id number]} -Current COPD Classification:  {CHL HP Upstream Pharm COPD Classification:[phone]} -MMRC/CAT score: *** -Pulmonary function testing: *** -Exacerbations requiring treatment in last 6 months: *** -Patient {Actions; denies-reports:120008} consistent use of maintenance inhaler -Frequency of rescue inhaler use: *** -Counseled on {CCMINHALERCOUNSELING:25121} -{CCMPHARMDINTERVENTION:25122}  Depression (Goal: ***) -{US  controlled/uncontrolled:25276} -Current treatment: . Bupropion XL 150 mg 1 tablet daily . Doxepin 10 mg 1 capsule at bedtime -Medications previously tried/failed: *** -PHQ9: *** -GAD7: *** -Connected with *** for mental health support -Educated on {CCM mental health counseling:25127} -{CCMPHARMDINTERVENTION:25122}  GERD/nausea (Goal: ***) -{US controlled/uncontrolled:25276} -Current treatment  . Famotidine 20 mg 1 tablet twice daily as needed . Nexium 40 mg 1 capsule twice daily . Sucralfate 1 g  1 teaspoon four times daily with meals and at bedtime . Ondansetron 4 mg 1 tablet as needed . Promethazine 25 mg 1 tablet as needed -Medications previously tried: ***  -{CCMPHARMDINTERVENTION:25122}   Neuropathy/muscle spasms (Goal: ***) -{US controlled/uncontrolled:25276} -Current treatment  . Lyrica 150 mg 1 capsule three times daily . Tizanidine 4 mg 1 tablet as needed -Medications previously tried: ***  -{CCMPHARMDINTERVENTION:25122}  Pain (Goal: ***) -{US controlled/uncontrolled:25276} -Current treatment  . Oxycodone 20 mg 1 tablet every 4 hours as needed for pain -Medications previously tried: ***  -{CCMPHARMDINTERVENTION:25122}  Vaccines   Reviewed and discussed patient's vaccination history.    Immunization History  Administered Date(s) Administered  . Influenza Split 08/19/2011  . Influenza Whole 01/03/2013  . Influenza,inj,Quad PF,6+ Mos 08/20/2013, 09/15/2014, 08/26/2015, 08/01/2016, 08/22/2017, 10/15/2018, 09/24/2019, 12/07/2020  . Influenza-Unspecified 08/03/2016  . Moderna Sars-Covid-2 Vaccination 03/11/2020, 04/08/2020, 08/03/2020  . Pneumococcal Conjugate-13 02/24/2021  . Pneumococcal Polysaccharide-23 08/19/2011, 12/12/2017  . Tdap 06/28/2014    Plan  Recommended patient receive *** vaccine in *** office.   Health Maintenance -Vaccine gaps: shingles -Current therapy:  . Calcium-vitamin D . Vitamin E 200 unit 1 capsule daily -  need? . Mupirocin? . Vitamin B12 500 mcg 1 tablet daily . Multivitamin 1 tablet daily . Glucosamine-chondroitin daily . Deplin? Verneita Griffes . Flonase -Educated on {ccm supplement counseling:25128} -{CCM Patient satisfied:25129} -{CCMPHARMDINTERVENTION:25122}   Patient Goals/Self-Care Activities . Patient will:  - {pharmacypatientgoals:24919}  Follow Up Plan: {CM FOLLOW UP XVEZ:50158}   Medication Assistance: {MEDASSISTANCEINFO:25044}  Patient's preferred pharmacy is:  [id number] [place] [place] [address] [place]Phone: [phone]: [phone] [id number] [place] [place]: [phone]: [phone] pill box? {Yes or If no, why not?:20788} Pt endorses ***% compliance  We discussed: {Pharmacy options:24294} Patient decided to: {US Pharmacy PlCEQF:37445}Care Plan and Follow Up Patient Decision:  {FOLLOWUP:24991}  Plan: {CM FOLLOW UP PLHQUI:47998}MaJeni SallesPharmD BCNavarroharmacist [place] Br[place]yer

## 2021-03-28 ENCOUNTER — Other Ambulatory Visit: Payer: Self-pay | Admitting: Family Medicine

## 2021-03-28 ENCOUNTER — Other Ambulatory Visit: Payer: Self-pay | Admitting: *Deleted

## 2021-03-28 ENCOUNTER — Other Ambulatory Visit: Payer: Self-pay | Admitting: Internal Medicine

## 2021-03-28 ENCOUNTER — Other Ambulatory Visit: Payer: Self-pay | Admitting: Adult Health

## 2021-03-28 DIAGNOSIS — M47812 Spondylosis without myelopathy or radiculopathy, cervical region: Secondary | ICD-10-CM

## 2021-03-28 DIAGNOSIS — M542 Cervicalgia: Secondary | ICD-10-CM

## 2021-04-05 ENCOUNTER — Telehealth: Payer: Self-pay | Admitting: Family Medicine

## 2021-04-05 NOTE — Telephone Encounter (Signed)
Left message for patient to call back and schedule Medicare Annual Wellness Visit (AWV) either virtually or in office.   Last AWV 12/31/19  please schedule at anytime with [facility] Nurse Health Advisor 1 or 2   This should be a 45 minute visit. 

## 2021-04-10 ENCOUNTER — Telehealth: Payer: Self-pay | Admitting: Internal Medicine

## 2021-04-10 NOTE — Telephone Encounter (Signed)
Inbound call from patient requesting additional refills for Phenergan and Nexium medications, has an appointment scheduled for 05/04/21 but is completely out.  Please advise.

## 2021-04-11 MED ORDER — ESOMEPRAZOLE MAGNESIUM 40 MG PO CPDR: 40.0000 mg | DELAYED_RELEASE_CAPSULE | Freq: Two times a day (BID) | ORAL | 1 refills | Status: DC

## 2021-04-11 MED ORDER — PROMETHAZINE HCL 25 MG PO TABS: 25.0000 mg | ORAL_TABLET | Freq: Four times a day (QID) | ORAL | 1 refills | Status: DC | PRN

## 2021-04-11 NOTE — Telephone Encounter (Signed)
Refilled Nexium and Phenergan

## 2021-04-20 ENCOUNTER — Other Ambulatory Visit: Payer: Self-pay | Admitting: Internal Medicine

## 2021-04-20 ENCOUNTER — Other Ambulatory Visit: Payer: Self-pay | Admitting: Family Medicine

## 2021-04-20 DIAGNOSIS — F339 Major depressive disorder, recurrent, unspecified: Secondary | ICD-10-CM

## 2021-04-21 ENCOUNTER — Ambulatory Visit: Payer: Medicare Other | Admitting: Orthopaedic Surgery

## 2021-04-21 ENCOUNTER — Other Ambulatory Visit: Payer: Self-pay

## 2021-04-21 ENCOUNTER — Encounter: Payer: Self-pay | Admitting: Orthopaedic Surgery

## 2021-04-21 DIAGNOSIS — M47812 Spondylosis without myelopathy or radiculopathy, cervical region: Secondary | ICD-10-CM | POA: Diagnosis not present

## 2021-04-21 NOTE — Progress Notes (Signed)
Office Visit Note   Patient: Shelby Anderson           Date of Birth: 1956-01-07           MRN: [id number] Visit Date: 04/21/2021              Requested by: Caren Macadam, MD [place],  [place] [address] PCP: Caren Macadam, MD   Assessment & Plan: Visit Diagnoses:  1. Spondylosis without myelopathy or radiculopathy, cervical region     Plan: We reviewed MRI scan with patient she does have some narrowing left paracentral and moderate foraminal stenosis but no radicular symptoms.  No myelopathic findings.  We discussed possible physical therapy referral if she would like to work on strengthening and balance and fall prevention.  She will call if she like proceed with this.  Plan to recheck her in 3 months.  Follow-Up Instructions: Return in about 3 months (around 07/22/2021).   Orders:  No orders of the defined types were placed in this encounter.  No orders of the defined types were placed in this encounter.     Procedures: No procedures performed   Clinical Data: No additional findings.   Subjective: Chief Complaint  Patient presents with  . Neck - Pain    HPI 65 year old female returns with spondylosis.  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  Previous C2 ganglionectomy by Dr. Billie Ruddy had problems with postop CSF leak x2 after the procedure.  This was done for problems with migraines.  She has had recent MRI scan which shows left side paracentral disc protrusion flattening the cord with moderate foraminal narrowing on the left.  She is having pain on the left side of her neck and does not radiate down to her hand.  She has discomfort when she turns her head to the left.  She states she fell down the stairs and landed against the wall and washing machine.  She had history of several falls which she attributes to her old broken ankle and knee problems.  She denies claudication symptoms no myelopathic type symptoms.  Patient  states when she bends her head down she sometimes gets dizzy.  Other problems include heart failure, hyperlipidemia with type 2 diabetes hypertension.  Positive history of breast cancer with chemotherapy and radiation.  Review of Systems 14 point system update unchanged other than as mentioned HPI from 04/22/2019 visit.   Objective: Vital Signs: BP 112/62   Pulse 81   Ht _0  (1.651 m)   Wt 207 lb 12.8 oz (94.3 kg)   BMI 34.58 kg/m   Physical Exam Constitutional:      Appearance: She is well-developed.  HENT:     Head: Normocephalic.     Right Ear: External ear normal.     Left Ear: External ear normal.  Eyes:     Pupils: Pupils are equal, round, and reactive to light.  Neck:     Thyroid: No thyromegaly.     Trachea: No tracheal deviation.  Cardiovascular:     Rate and Rhythm: Normal rate.  Pulmonary:     Effort: Pulmonary effort is normal.  Abdominal:     Palpations: Abdomen is soft.  Skin:    General: Skin is warm and dry.  Neurological:     Mental Status: She is alert and oriented to person, place, and time.  Psychiatric:        Behavior: Behavior normal.     Ortho Exam patient  has minimal brachial plexus tenderness.  50% rotation the left and tilting left with sharp neck pain.  No radicular symptoms to her hand.  Upper extremity reflexes are 2+ and symmetrical.  No lower extremity hyperreflexia. Specialty Comments:  No specialty comments available.  Imaging: No results found.   PMFS History: Patient Active Problem List   Diagnosis Date Noted  . Spondylosis without myelopathy or radiculopathy, cervical region 04/21/2021  . Acute on chronic diastolic CHF (congestive heart failure) ([place]) 10/01/2019  . Neutropenia with fever ([place]) 10/01/2019  . Genetic testing 09/17/2019  . Family history of melanoma   . Family history of pancreatic cancer   . Malignant neoplasm of upper-outer quadrant of left breast in female, estrogen receptor negative ([place]) 09/07/2019  .  Encounter for loop recorder at end of battery life 04/24/2018  . Esophageal stricture 07/01/2017  . Hyperlipidemia associated with type 2 diabetes mellitus ([place]) 05/20/2017  . Allergic rhinitis 03/11/2017  . Dilated cardiomyopathy ([place]) 02/07/2017  . 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 Morbid obesity due to excess calories ([place]) 09/26/2016  . S/P left TKA 09/25/2016  . Increased endometrial stripe thickness 06/27/2016  . Intramural leiomyoma of uterus 06/27/2016  . Type 2 diabetes mellitus without complication, without long-term current use of insulin ([place]) 12/06/2015  . Hypertension associated with diabetes ([place]) 07/19/2015  . Arrhythmia 07/19/2015  . Orthostatic hypotension 07/19/2015  . GERD (gastroesophageal reflux disease) 07/19/2015  . Chronic back pain 07/19/2015  . Neuropathy 07/19/2015  . Symptomatic PVCs 11/02/2014  . Syncope 11/02/2014  . Chest pain 12/23/2013  . Disorder of cervix 03/10/2013  . Vaginal atrophy 03/10/2013  . OSA (obstructive sleep apnea) 07/31/2012  . Asthma 04/29/2012  . Coronary artery disease 11/20/2011   Past Medical History:  Diagnosis Date  . Allergy   . Anemia    "chronic"  . Angina    Prinzmetal angina  . Anxiety and depression   . Arthritis   . Asthma   . Atrial fibrillation ([place])    h/o "AF w/frequent PVCs"  . Breast cancer ([place])    Left  . Cancer (HCC)    hx of skin cancer   . CHF (congestive heart failure) ([place])   . Chronic back pain greater than 3 months duration    on chronic narcotics, treated at pain clinic  . Chronic respiratory failure ([place]) 09/15/2015   ONO 09/04/15 + desats >begin O2 at 2l/ m   . Colon polyps    hyperplastic  . Coronary artery disease    Arrythmia, orthostatic hypotension, HLD, HTN; sees Dr. Einar Gip  . Difficult intubation    "TMJ & woke up when they were still cutting on me"  . Dysrhythmia    sees Dr. Einar Gip and a cardiologist at [facility]  . Esophageal stricture   . Family  history of melanoma ]  . Family history of pancreatic cancer   . Fatty liver   . Fatty liver   . Fibroids   . Fibromyalgia    "in my legs"  . GERD (gastroesophageal reflux disease)    hx hiatal hernia, stricture and gastric ulcer  . Headache(784.0)    migraines  . Heart murmur   . Hiatal hernia   . History of loop recorder   . History of migraines    "dx'd when I was in my teens"  . Hyperlipemia   . Hypertension   . Mental disorder   . Mild episode of recurrent major depressive disorder ([place])  12/06/2015  . Myocardial infarction [facility]) 1980's & 1990;   sees Dr. Einar Gip  . OSA (obstructive sleep apnea)   . Personal history of chemotherapy   . Personal history of radiation therapy   . Pneumonia    multiple times  . PONV (postoperative nausea and vomiting)   . Recurrent upper respiratory infection (URI)   . Shortness of breath 11/20/11   "all the time", sees pulmonlogy, ? asthma  . Stenotic cervical os   . Stomach ulcer    "3 small; found in 05/2011"  . TMJ (dislocation of temporomandibular joint)   . Tuberculosis    + TB SKIN TEST  . Type 2 diabetes mellitus without complication, without long-term current use of insulin ([place]) 12/06/2015   not on meds     Family History  Problem Relation Age of Onset  . Malignant hyperthermia Father   . Hypertension Father   . Heart disease Father   . Diabetes Father   . Cancer Father        skin  . Hypertension Mother   . Heart disease Mother   . Multiple myeloma Mother   . Cancer Sister        CERVICAL  . Hypertension Sister   . Cancer Brother 22       MELANOMA  . Heart disease Maternal Grandmother   . Other Maternal Grandmother 32       complications of childbirth  . Heart disease Maternal Grandfather   . Cancer Paternal Grandmother        ?   Marland Kitchen Heart disease Paternal Grandmother   . Heart disease Paternal Grandfather   . Cancer Brother        LUNG  . Diabetes Sister   . Hypertension Sister   . Heart disease Sister   .  Cancer Sister   . Cancer Brother   . Pancreatic cancer Niece 61  . Cancer Nephew 40       unknown- currently in the [employer]  . Anesthesia problems Neg Hx   . Hypotension Neg Hx   . Pseudochol deficiency Neg Hx   . Colon cancer Neg Hx   . Esophageal cancer Neg Hx   . Rectal cancer Neg Hx   . Stomach cancer Neg Hx   . Breast cancer Neg Hx     Past Surgical History:  Procedure Laterality Date  . ACHILLES TENDON REPAIR  1970's   left ankle  . ARTHROSCOPIC REPAIR ACL     left knee cap  . BREAST BIOPSY Right 04/08/2013   again in October or November 2020  . BREAST LUMPECTOMY WITH RADIOACTIVE SEED AND SENTINEL LYMPH NODE BIOPSY Left 12/09/2019   Procedure: LEFT BREAST LUMPECTOMY WITH RADIOACTIVE SEED AND LEFT AXILLARY SENTINEL LYMPH NODE BIOPSY;  Surgeon: Rolm Bookbinder, MD;  Location: [facility];  Service: General;  Laterality: Left;  PEC BLOCK  . CARDIAC CATHETERIZATION     loop recorder  . CARPAL TUNNEL RELEASE  unknown   left hand  . COLONOSCOPY    . ESOPHAGOGASTRODUODENOSCOPY (EGD) WITH PROPOFOL N/A 10/29/2017   Procedure: ESOPHAGOGASTRODUODENOSCOPY (EGD) WITH PROPOFOL;  Surgeon: Irene Shipper, MD;  Location: [facility];  Service: Endoscopy;  Laterality: N/A;  . LOOP RECORDER IMPLANT    . PORT-A-CATH REMOVAL N/A 12/09/2019   Procedure: REMOVAL PORT-A-CATH;  Surgeon: Rolm Bookbinder, MD;  Location: [facility];  Service: General;  Laterality: N/A;  . PORTACATH PLACEMENT N/A 09/23/2019   Procedure: INSERTION PORT-A-CATH Right Internal Doreen Salvage;  Surgeon: Donne Hazel,  Rodman Key, MD;  Location: [facility];  Service: General;  Laterality: N/A;  . post ganglionectomy  1970's   "for migraine headaches"  . pouch string  [id number]   "did this 3 times (once w/each pregnancy)"  . SAVORY DILATION N/A 10/29/2017   Procedure: SAVORY DILATION;  Surgeon: Irene Shipper, MD;  [facility];  Service: Endoscopy;  Laterality: N/A;  . TOTAL KNEE ARTHROPLASTY Left 09/25/2016   Procedure:  LEFT TOTAL KNEE ARTHROPLASTY;  Surgeon: Paralee Cancel, MD;  Location: [facility];  Service: Orthopedics;  Laterality: Left;  . TUBAL LIGATION  1980's   Social History   Occupational History  . Occupation: Retired Therapist, sports  Tobacco Use  . Smoking status: Never Smoker  . Smokeless tobacco: Never Used  Vaping Use  . Vaping Use: Never used  Substance and Sexual Activity  . Alcohol use: No    Alcohol/week: 0.0 standard drinks  . Drug use: No  . Sexual activity: Not Currently    Birth control/protection: Surgical    Comment: 1st intercourse 65 yo-Fewer than 5 partners

## 2021-04-25 DIAGNOSIS — M47816 Spondylosis without myelopathy or radiculopathy, lumbar region: Secondary | ICD-10-CM | POA: Diagnosis not present

## 2021-04-25 DIAGNOSIS — G894 Chronic pain syndrome: Secondary | ICD-10-CM | POA: Diagnosis not present

## 2021-04-25 DIAGNOSIS — G893 Neoplasm related pain (acute) (chronic): Secondary | ICD-10-CM | POA: Diagnosis not present

## 2021-04-25 DIAGNOSIS — Z79891 Long term (current) use of opiate analgesic: Secondary | ICD-10-CM | POA: Diagnosis not present

## 2021-05-04 ENCOUNTER — Encounter: Payer: Self-pay | Admitting: Nurse Practitioner

## 2021-05-04 ENCOUNTER — Ambulatory Visit: Payer: Medicare Other | Admitting: Nurse Practitioner

## 2021-05-04 VITALS — BP 138/84 | HR 85 | Ht 65.0 in | Wt 204.0 lb

## 2021-05-04 DIAGNOSIS — R110 Nausea: Secondary | ICD-10-CM | POA: Diagnosis not present

## 2021-05-04 DIAGNOSIS — Z1211 Encounter for screening for malignant neoplasm of colon: Secondary | ICD-10-CM

## 2021-05-04 DIAGNOSIS — R1319 Other dysphagia: Secondary | ICD-10-CM

## 2021-05-04 DIAGNOSIS — K219 Gastro-esophageal reflux disease without esophagitis: Secondary | ICD-10-CM

## 2021-05-04 MED ORDER — PROMETHAZINE HCL 25 MG PO TABS: 25.0000 mg | ORAL_TABLET | Freq: Every day | ORAL | 1 refills | Status: DC

## 2021-05-04 MED ORDER — ESOMEPRAZOLE MAGNESIUM 40 MG PO CPDR: 40.0000 mg | DELAYED_RELEASE_CAPSULE | Freq: Two times a day (BID) | ORAL | 1 refills | Status: DC

## 2021-05-04 MED ORDER — FAMOTIDINE 20 MG PO TABS: 20.0000 mg | ORAL_TABLET | Freq: Two times a day (BID) | ORAL | 1 refills | Status: DC | PRN

## 2021-05-04 MED ORDER — NA SULFATE-K SULFATE-MG SULF 17.5-3.13-1.6 GM/177ML PO SOLN: 1.0000 | Freq: Once | ORAL | 0 refills | Status: AC

## 2021-05-04 MED ORDER — ONDANSETRON HCL 4 MG PO TABS: 4.0000 mg | ORAL_TABLET | Freq: Three times a day (TID) | ORAL | 5 refills | Status: DC | PRN

## 2021-05-04 NOTE — Patient Instructions (Addendum)
We have sent the following medications to your pharmacy for you to pick up at your convenience: Nexium 40 mg twice daily.  Pepcid 20 mg twice daily as needed. Zofran 4 mg every 8 hours as needed for nausea. Phenergan nightly.   You have been scheduled for an endoscopy and colonoscopy. Please follow the written instructions given to you at your visit today. Please pick up your prep supplies at the pharmacy within the next 1-3 days. If you use inhalers (even only as needed), please bring them with you on the day of your procedure.  If you are age 55 or older, your body mass index should be between 23-30. Your Body mass index is 33.95 kg/m. If this is out of the aforementioned range listed, please consider follow up with your Primary Care Provider.  If you are age 18 or younger, your body mass index should be between 19-25. Your Body mass index is 33.95 kg/m. If this is out of the aformentioned range listed, please consider follow up with your Primary Care Provider.   __________________________________________________________  The [facility] providers would like to encourage you to use [facility] to communicate with providers for non-urgent requests or questions.  Due to long hold times on the telephone, sending your provider a message by [facility] may be a faster and more efficient way to get a response.  Please allow 48 business hours for a response.  Please remember that this is for non-urgent requests.

## 2021-05-04 NOTE — Progress Notes (Signed)
ASSESSMENT AND PLAN    # 65 yo female with chronic GERD manifested as regurgitation and complicated by peptic stricture. Also with chronic nausea. Takes Zofran as needed during the day, Phenergan at night. She is on BID PPI plus BID Pepcid. Also on Carafate TID.  --Continue anti-reflux measures  --Will refill Zofran 4 mg, Phenergan 25 mg # 30,  --Continue Carafate TID ac. Advised to separate from other medications by two hours. --Not sure there is added benefit to BID pepcid since she is already on high dose PPI. Recommended she change to taking Pepcid on a prn basis.  --For further evaluation and treatment of dysphagia patient will be scheduled for EGD with dilation. The risks and benefits of EGD with possible biopsies was discussed with the patient and she agrees to proceed.  --Advised patient to eat small bites, chew well with liquids in between bites to avoid food impaction.   # Colon cancer screening. Her last colonoscopy was just over 10 years ago.   --Will schedule for screening colonoscopy to be done at time of EGD. The risks and benefits of colonoscopy with possible polypectomy / biopsies were discussed and the patient agrees to proceed.  # Chronic, intermittent minor rectal bleeding with BMs in absence of constipation / straining. Bleeding probably hemorrhoidal.   HISTORY OF PRESENT ILLNESS    Chief Complaint : needs refills. swallowing difficulties, rectal bleeding   Shelby Anderson is an  65 y.o. female known to Dr. Henrene Pastor  with a past medical history significant for diabetes, GERD complicated by peptic stricture. hyperlipidemia, left breast cancer October 2022 s/p lumpectomy and chemoradiation.She is on multiple medications and has several medication allergies  See PMH below for any additional medical problems.     Shelby Anderson was last seen in 2018. She as chronic GERD manifested as regurgitation. She has chronic nausea.  She takes Phenergan at night and Zofran if needed  during the day. She is on BID PPI, Carafate TID and at some point she was started on BID Pepcid. She has sleep apnea, uses a CPAP which she says helps with reflux. Over the last six months she has developed recurrent solid food dysphagia.  Her last EGD with dilation of a mild benign appearing esophageal stenosis was in 2018.Dilation was performed with a Maloney dilator with no resistance at 70 Fr.  Shelby Anderson sometimes has blood in her stool. She denies constipation or bowel changes  DATA REVIEWED: March 2022 A1c 7.4 Normal renal function Normal CBC    PREVIOUS ENDOSCOPIC EVALUATIONS / PERTINENT STUDIES:    November 2018 EGD -Benign-appearing esophageal stenosis. Dilated. 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 - Normal examined duodenum. - No specimens collected     Past Medical History:  Diagnosis Date  . Allergy   . Anemia    "chronic"  . Angina    Prinzmetal angina  . Anxiety and depression   . Arthritis   . Asthma   . Atrial fibrillation ([place])    h/o "AF w/frequent PVCs"  . Breast cancer ([place])    Left  . Cancer (HCC)    hx of skin cancer   . CHF (congestive heart failure) ([place])   . Chronic back pain greater than 3 months duration    on chronic narcotics, treated at pain clinic  . Chronic respiratory failure ([place]) 09/15/2015   ONO 09/04/15 + desats >begin O2 at 2l/ m   . Colon polyps    hyperplastic  . Coronary  artery disease    Arrythmia, orthostatic hypotension, HLD, HTN; sees Dr. Einar Gip  . Difficult intubation    "TMJ & woke up when they were still cutting on me"  . Dysrhythmia    sees Dr. Einar Gip and a [facility]ologist at [facility]  . Esophageal stricture   . Family history of melanoma ]  . Family history of pancreatic cancer   . Fatty liver   . Fatty liver   . Fibroids   . Fibromyalgia    "in my legs"  . GERD (gastroesophageal reflux disease)    hx hiatal hernia, stricture and gastric ulcer  . Headache(784.0)    migraines  . Heart murmur   .  Hiatal hernia   . History of loop recorder   . History of migraines    "dx'd when I was in my teens"  . Hyperlipemia   . Hypertension   . Mental disorder   . Mild episode of recurrent major depressive disorder ([place]) 12/06/2015  . Myocardial infarction [facility]) 1980's & 1990;   sees Dr. Einar Gip  . OSA (obstructive sleep apnea)   . Personal history of chemotherapy   . Personal history of radiation therapy   . Pneumonia    multiple times  . PONV (postoperative nausea and vomiting)   . Recurrent upper respiratory infection (URI)   . Shortness of breath 11/20/11   "all the time", sees pulmonlogy, ? asthma  . Stenotic cervical os   . Stomach ulcer    "3 small; found in 05/2011"  . TMJ (dislocation of temporomandibular joint)   . Tuberculosis    + TB SKIN TEST  . Type 2 diabetes mellitus without complication, without long-term current use of insulin ([place]) 12/06/2015   not on meds     Current Medications, Allergies, Past Surgical History, Family History and Social History were reviewed in [employer] record.   Current Outpatient Medications  Medication Sig Dispense Refill  . albuterol (PROVENTIL) (2.5 MG/3ML) 0.083% nebulizer solution Take 3 mLs (2.5 mg total) by nebulization every 6 (six) hours as needed for wheezing or shortness of breath (J45.40). (Patient taking differently: Take 2.5 mg by nebulization every 6 (six) hours as needed for wheezing or shortness of breath.) 75 mL 3  . albuterol (VENTOLIN HFA) 108 (90 Base) MCG/ACT inhaler Inhale 2 puffs into the lungs every 6 (six) hours as needed for wheezing or shortness of breath. 3 g 3  . aspirin EC 81 MG tablet Take 81 mg at bedtime by mouth.     Marland Kitchen atorvastatin (LIPITOR) 40 MG tablet TAKE 1 TABLET BY MOUTH EVERYDAY AT BEDTIME. **DUE FOR YEARLY PHYSICAL** 30 tablet 5  . Blood Glucose Monitoring Suppl (ONE TOUCH ULTRA 2) w/Device KIT USE TO CHECK BLOOD SUGAR 2 TIMES A DAY 1 kit 0  . budesonide-formoterol (SYMBICORT)  80-4.5 MCG/ACT inhaler INHALE 2 PUFFS INTO THE LUNGS EVERY MORNING AND ANOTHER 2 PUFFS 12 HOURS LATER 30.6 Inhaler 1  . buPROPion (WELLBUTRIN XL) 150 MG 24 hr tablet TAKE 1 TABLET BY MOUTH EVERY DAY 90 tablet 1  . Calcium Carbonate-Vitamin D 600-200 MG-UNIT TABS Take 1 tablet by mouth daily.    Marland Kitchen CINNAMON PO Take 1,000 mg 2 (two) times daily by mouth.    . clobetasol cream (TEMOVATE) 0.05 % Apply topically 2 (two) times daily. (Patient taking differently: Apply topically daily as needed (rash).) 30 g 0  . doxepin (SINEQUAN) 10 MG capsule Take 20 mg by mouth at bedtime.     Marland Kitchen  esomeprazole (NEXIUM) 40 MG capsule Take 1 capsule (40 mg total) by mouth 2 (two) times daily. 180 capsule 1  . famotidine (PEPCID) 20 MG tablet TAKE 1 TABLET BY MOUTH 2 TIMES DAILY AS NEEDED FOR HEARTBURN OR INDIGESTION. 180 tablet 1  . fluticasone (FLONASE) 50 MCG/ACT nasal spray Place 2 sprays at bedtime as needed into both nostrils for allergies or rhinitis.     . furosemide (LASIX) 20 MG tablet Take 1 tablet (20 mg total) by mouth daily as needed for fluid. 90 tablet 3  . glipiZIDE (GLUCOTROL XL) 5 MG 24 hr tablet TAKE 1 TABLET BY MOUTH EVERYDAY WITH BREAKFAST. **DUE FOR YEARLY PHYSICAL** 30 tablet 2  . L-Methylfolate (DEPLIN) 7.5 MG TABS Take 7.5 mg by mouth daily with breakfast.    . Lancets (ONETOUCH DELICA PLUS [id number]) MISC USE TO CHECK BLOOD SUGAR 2 TIMES A DAY 100 each 3  . levothyroxine (SYNTHROID) 50 MCG tablet TAKE 1 TABLET BY MOUTH EVERY DAY 90 tablet 0  . LYRICA 150 MG capsule Take 150 mg by mouth 3 (three) times daily.   2  . metFORMIN (GLUCOPHAGE) 500 MG tablet TAKE 1 TABLET BY MOUTH TWICE A DAY WITH MEALS 60 tablet 2  . metoprolol succinate (TOPROL-XL) 25 MG 24 hr tablet Take 1 tablet (25 mg total) by mouth daily. Take with or immediately following a meal. 90 tablet 3  . Misc Natural Products (GLUCOS-CHONDROIT-MSM COMPLEX PO) Take 1 tablet 3 (three) times daily by mouth.     . mometasone (ELOCON) 0.1 %  cream Apply topically.    . montelukast (SINGULAIR) 10 MG tablet TAKE 1 TABLET BY MOUTH EVERYDAY AT BEDTIME 90 tablet 1  . Multiple Vitamin (MULITIVITAMIN WITH MINERALS) TABS Take 1 tablet by mouth daily.    . mupirocin cream (BACTROBAN) 2 % Apply once daily to lesions on feet as needed. 30 g 1  . nitroGLYCERIN (NITROSTAT) 0.4 MG SL tablet Place 0.4 mg under the tongue every 5 (five) minutes as needed for chest pain.     Marland Kitchen ondansetron (ZOFRAN) 4 MG tablet TAKE 1 TABLET (4 MG TOTAL) BY MOUTH EVERY 4 (FOUR) HOURS AS NEEDED FOR NAUSEA OR VOMITING. 90 tablet 3  . ONETOUCH ULTRA test strip USE TO CHECK BLOOD SUGAR 2 TIMES A DAY 100 strip 3  . Oxycodone HCl 20 MG TABS Take 20 mg every 4 (four) hours as needed by mouth (pain).     . potassium chloride (KLOR-CON) 10 MEQ tablet TAKE 2 TABLETS BY MOUTH EVERY DAY 180 tablet 0  . promethazine (PHENERGAN) 25 MG tablet Take 1 tablet (25 mg total) by mouth every 6 (six) hours as needed for nausea or vomiting. 90 tablet 1  . Spacer/Aero-Holding Chambers DEVI 1 Device by Does not apply route daily as needed. 1 Device 0  . sucralfate (CARAFATE) 1 g tablet DISSOLVE 1 TAB IN 1 TABLESPOON OF DISTILLED WATER FOR 15 MINS. TAKE 4XDAILY WITH MEALS & AT BEDTIME 360 tablet 3  . tiZANidine (ZANAFLEX) 4 MG tablet Take 4 mg by mouth every 8 (eight) hours as needed for muscle spasms.    . vitamin B-12 (CYANOCOBALAMIN) 500 MCG tablet Take 500 mcg by mouth daily.     . vitamin E 200 UNIT capsule Take 200 Units by mouth daily.     No current facility-administered medications for this visit.    Review of Systems: No chest pain. No shortness of breath. No urinary complaints.   PHYSICAL EXAM :    Wt Readings  from Last 3 Encounters:  05/04/21 204 lb (92.5 kg)  04/21/21 207 lb 12.8 oz (94.3 kg)  03/13/21 213 lb (96.6 kg)    BP 138/84   Pulse 85   Ht _0  (1.651 m)   Wt 204 lb (92.5 kg)   BMI 33.95 kg/m  Constitutional:  Pleasant female in no acute  distress. Psychiatric: Normal mood and affect. Behavior is normal. EENT: Pupils normal.  Conjunctivae are normal. No scleral icterus. Neck supple.  Cardiovascular: Normal rate, regular rhythm. No edema Pulmonary/chest: Effort normal and breath sounds normal. No wheezing, rales or rhonchi. Abdominal: Soft, nondistended, nontender. Bowel sounds active throughout. There are no masses palpable. No hepatomegaly. Neurological: Alert and oriented to person place and time. Skin: Skin is warm and dry. No rashes noted.  Tye Savoy, NP  05/04/2021, 3:48 PM

## 2021-05-05 NOTE — Progress Notes (Signed)
Noted  

## 2021-05-10 ENCOUNTER — Ambulatory Visit (AMBULATORY_SURGERY_CENTER): Payer: Medicare Other | Admitting: Internal Medicine

## 2021-05-10 ENCOUNTER — Other Ambulatory Visit: Payer: Self-pay

## 2021-05-10 ENCOUNTER — Telehealth: Payer: Self-pay | Admitting: Internal Medicine

## 2021-05-10 ENCOUNTER — Encounter: Payer: Self-pay | Admitting: Internal Medicine

## 2021-05-10 VITALS — BP 122/74 | HR 77 | Temp 97.3°F | Resp 15 | Ht 65.0 in | Wt 204.0 lb

## 2021-05-10 DIAGNOSIS — K222 Esophageal obstruction: Secondary | ICD-10-CM | POA: Diagnosis not present

## 2021-05-10 DIAGNOSIS — R1319 Other dysphagia: Secondary | ICD-10-CM

## 2021-05-10 DIAGNOSIS — R1310 Dysphagia, unspecified: Secondary | ICD-10-CM | POA: Diagnosis not present

## 2021-05-10 DIAGNOSIS — K219 Gastro-esophageal reflux disease without esophagitis: Secondary | ICD-10-CM

## 2021-05-10 MED ORDER — SODIUM CHLORIDE 0.9 % IV SOLN: 500.0000 mL | Freq: Once | INTRAVENOUS | Status: DC

## 2021-05-10 NOTE — Progress Notes (Signed)
A and O x3. Report to RN. Tolerated MAC anesthesia well.Teeth unchanged after procedure.

## 2021-05-10 NOTE — Telephone Encounter (Signed)
Inbound call from pt stating that she has a procedure today at 11:00am. She stated she took her prep last night but overslept this morning and didn't take it at 6:00am. Please advise. Thank you.

## 2021-05-10 NOTE — Telephone Encounter (Signed)
Pt overslept and did not complete 2nd half of prep.  Spoke with Dr. Henrene Pastor and he advised that pt can do upper procedure only today or reschedule both.  Per pt she will come in for EGD only today.  Rescheduled colon for 8/8 at 10:00am.

## 2021-05-10 NOTE — Progress Notes (Signed)
Called to room to assist during endoscopic procedure.  Patient ID and intended procedure confirmed with present staff. Received instructions for my participation in the procedure from the performing physician.  

## 2021-05-10 NOTE — Progress Notes (Signed)
VS taken by [facility]

## 2021-05-10 NOTE — Patient Instructions (Signed)
FOLLOW DILATATION DIET GIVEN TO YOU TODAY - DUE TO DILATION OF ESOPHAGUS TODAY  KEEP PLANS FOR COLONOSCOPY AND VISIT WITH NURSE FOR INSTRUCTIONS AS ALREADY SCHEDULED ( SEE BELOW)     YOU HAD AN ENDOSCOPIC PROCEDURE TODAY AT [place]:   Refer to the procedure report that was given to you for any specific questions about what was found during the examination.  If the procedure report does not answer your questions, please call your gastroenterologist to clarify.  If you requested that your care partner not be given the details of your procedure findings, then the procedure report has been included in a sealed envelope for you to review at your convenience later.  YOU SHOULD EXPECT: Some feelings of bloating in the abdomen. Passage of more gas than usual.  Walking can help get rid of the air that was put into your GI tract during the procedure and reduce the bloating. If you had a lower endoscopy (such as a colonoscopy or flexible sigmoidoscopy) you may notice spotting of blood in your stool or on the toilet paper. If you underwent a bowel prep for your procedure, you may not have a normal bowel movement for a few days.  Please Note:  You might notice some irritation and congestion in your nose or some drainage.  This is from the oxygen used during your procedure.  There is no need for concern and it should clear up in a day or so.  SYMPTOMS TO REPORT IMMEDIATELY:     Following upper endoscopy (EGD)  Vomiting of blood or coffee ground material  New chest pain or pain under the shoulder blades  Painful or persistently difficult swallowing  New shortness of breath  Fever of 100F or higher  Black, tarry-looking stools  For urgent or emergent issues, a gastroenterologist can be reached at any hour by calling [phone]. Do not use MyChart messaging for urgent concerns.    DIET:  We do recommend a small meal at first, but then you may proceed to your regular diet.   Drink plenty of fluids but you should avoid alcoholic beverages for 24 hours.  ACTIVITY:  You should plan to take it easy for the rest of today and you should NOT DRIVE or use heavy machinery until tomorrow (because of the sedation medicines used during the test).    FOLLOW UP: Our staff will call the number listed on your records 48-72 hours following your procedure to check on you and address any questions or concerns that you may have regarding the information given to you following your procedure. If we do not reach you, we will leave a message.  We will attempt to reach you two times.  During this call, we will ask if you have developed any symptoms of COVID 19. If you develop any symptoms (ie: fever, flu-like symptoms, shortness of breath, cough etc.) before then, please call [phone].  If you test positive for Covid 19 in the 2 weeks post procedure, please call and report this information to [place].    If any biopsies were taken you will be contacted by phone or by letter within the next 1-3 weeks.  Please call us at [phone] if you have not heard about the biopsies in 3 weeks.    SIGNATURES/CONFIDENTIALITY: You and/or your care partner have signed paperwork which will be entered into your electronic medical record.  These signatures attest to the fact that that the information above on your After  Visit Summary has been reviewed and is understood.  Full responsibility of the confidentiality of this discharge information lies with you and/or your care-partner.

## 2021-05-10 NOTE — Op Note (Signed)
[place] Patient Name: Shelby Anderson Procedure Date: 05/10/2021 11:14 AM MRN: [id number] Endoscopist: Docia Chuck. Henrene Pastor , MD Age: 65 Referring MD:  Date of Birth: 02-25-1956 Gender: Female Account #: [id number] Procedure:                Upper GI endoscopy with [facility] dilation of the                            esophagus. 21 [place] Indications:              Dysphagia, Therapeutic procedure, Esophageal reflux Medicines:                Monitored Anesthesia Care Procedure:                Pre-Anesthesia Assessment:                           - Prior to the procedure, a History and Physical                            was performed, and patient medications and                            allergies were reviewed. The patient's tolerance of                            previous anesthesia was also reviewed. The risks                            and benefits of the procedure and the sedation                            options and risks were discussed with the patient.                            All questions were answered, and informed consent                            was obtained. Prior Anticoagulants: The patient has                            taken no previous anticoagulant or antiplatelet                            agents. After reviewing the risks and benefits, the                            patient was deemed in satisfactory condition to                            undergo the procedure.                           After obtaining informed consent, the endoscope was  passed under direct vision. Throughout the                            procedure, the patient's blood pressure, pulse, and                            oxygen saturations were monitored continuously. The                            Endoscope was introduced through the mouth, and                            advanced to the second part of duodenum. The upper                            GI endoscopy  was accomplished without difficulty.                            The patient tolerated the procedure well. Scope In: Scope Out: Findings:                 One benign-appearing, intrinsic moderate stenosis                            was found 35 cm from the incisors. This stenosis                            measured 1.5 cm (inner diameter). The scope was                            withdrawn. Dilation was performed with a Maloney                            dilator with no resistance at 13 Fr.                           The exam of the esophagus was otherwise normal.                           The stomach was normal, save hiatal hernia.                           The examined duodenum was normal.                           The cardia and gastric fundus were normal on                            retroflexion. Complications:            No immediate complications. Estimated Blood Loss:     Estimated blood loss: none. Impression:               1. GERD with esophageal stricture status post  dilation                           2. Otherwise unremarkable EGD. Recommendation:           - Patient has a contact number available for                            emergencies. The signs and symptoms of potential                            delayed complications were discussed with the                            patient. Return to normal activities tomorrow.                            Written discharge instructions were provided to the                            patient.                           - Post dilation diet.                           - Continue present medications.                           -Keep plans for screening colonoscopy as rescheduled [name], MD 05/10/2021 11:38:58 AM This report has been signed electronically.

## 2021-05-12 ENCOUNTER — Telehealth: Payer: Self-pay | Admitting: *Deleted

## 2021-05-12 ENCOUNTER — Telehealth: Payer: Self-pay

## 2021-05-12 ENCOUNTER — Other Ambulatory Visit: Payer: Self-pay | Admitting: Family Medicine

## 2021-05-12 NOTE — Telephone Encounter (Signed)
  Follow up Call-  Call back number 05/10/2021  Post procedure Call Back phone  # [phone]  Permission to leave phone message Yes  Some recent data might be hidden     Patient questions:  Do you have a fever, pain , or abdominal swelling? No. Pain Score  0 *  Have you tolerated food without any problems? Yes.    Have you been able to return to your normal activities? Yes.    Do you have any questions about your discharge instructions: Diet   No. Medications  No. Follow up visit  No.  Do you have questions or concerns about your Care? No.  Actions: * If pain score is 4 or above: No action needed, pain <4.  Have you developed a fever since your procedure? no  2.   Have you had an respiratory symptoms (SOB or cough) since your procedure? no  3.   Have you tested positive for COVID 19 since your procedure no  4.   Have you had any family members/close contacts diagnosed with the COVID 19 since your procedure?  no   If yes to any of these questions please route to Joylene John, RN and Joella Prince, RN

## 2021-05-12 NOTE — Telephone Encounter (Signed)
Left message on follow up call. 

## 2021-05-17 ENCOUNTER — Ambulatory Visit: Payer: Medicare Other | Admitting: Podiatry

## 2021-05-17 ENCOUNTER — Other Ambulatory Visit: Payer: Self-pay

## 2021-05-17 ENCOUNTER — Encounter: Payer: Self-pay | Admitting: Podiatry

## 2021-05-17 DIAGNOSIS — B351 Tinea unguium: Secondary | ICD-10-CM | POA: Diagnosis not present

## 2021-05-17 DIAGNOSIS — E1151 Type 2 diabetes mellitus with diabetic peripheral angiopathy without gangrene: Secondary | ICD-10-CM

## 2021-05-18 ENCOUNTER — Other Ambulatory Visit: Payer: Self-pay | Admitting: Family Medicine

## 2021-05-18 DIAGNOSIS — C50412 Malignant neoplasm of upper-outer quadrant of left female breast: Secondary | ICD-10-CM | POA: Diagnosis not present
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
## 2021-05-31 ENCOUNTER — Other Ambulatory Visit: Payer: Self-pay | Admitting: Family Medicine

## 2021-06-07 ENCOUNTER — Telehealth: Payer: Self-pay | Admitting: Family Medicine

## 2021-06-07 ENCOUNTER — Other Ambulatory Visit: Payer: Self-pay | Admitting: Family Medicine

## 2021-06-07 NOTE — Telephone Encounter (Signed)
Tried calling patient to schedule Medicare Annual Wellness Visit (AWV) either virtually or in office.  Bad connection tried several times   Last AWV 12/31/19  please schedule at anytime with [facility] Nurse Health Advisor 1 or 2   This should be a 45 minute visit.

## 2021-06-20 DIAGNOSIS — G894 Chronic pain syndrome: Secondary | ICD-10-CM | POA: Diagnosis not present

## 2021-06-20 DIAGNOSIS — Z79891 Long term (current) use of opiate analgesic: Secondary | ICD-10-CM | POA: Diagnosis not present

## 2021-06-20 DIAGNOSIS — G893 Neoplasm related pain (acute) (chronic): Secondary | ICD-10-CM | POA: Diagnosis not present

## 2021-06-20 DIAGNOSIS — J45909 Unspecified asthma, uncomplicated: Secondary | ICD-10-CM | POA: Diagnosis not present

## 2021-06-20 DIAGNOSIS — G4733 Obstructive sleep apnea (adult) (pediatric): Secondary | ICD-10-CM | POA: Diagnosis not present

## 2021-06-20 DIAGNOSIS — M47816 Spondylosis without myelopathy or radiculopathy, lumbar region: Secondary | ICD-10-CM | POA: Diagnosis not present

## 2021-06-26 ENCOUNTER — Other Ambulatory Visit: Payer: Self-pay

## 2021-06-26 ENCOUNTER — Ambulatory Visit (AMBULATORY_SURGERY_CENTER): Payer: Self-pay | Admitting: *Deleted

## 2021-06-26 VITALS — Ht 65.0 in | Wt 213.0 lb

## 2021-06-26 DIAGNOSIS — Z1211 Encounter for screening for malignant neoplasm of colon: Secondary | ICD-10-CM

## 2021-06-26 MED ORDER — NA SULFATE-K SULFATE-MG SULF 17.5-3.13-1.6 GM/177ML PO SOLN: ORAL | 0 refills | Status: DC

## 2021-06-26 NOTE — Progress Notes (Signed)
Patient is here in-person for PV. Patient denies any allergies to eggs or soy. Patient denies any problems with anesthesia/sedation. Patient denies any oxygen use at home. Patient denies taking any diet/weight loss medications or blood thinners. Patient is aware of our care-partner policy and 0000000 safety protocol. EMMI education assigned to the patient for the procedure, sent to [place].   Patient is COVID-19 vaccinated, per patient.  Patient denies any medical hx, family hx or medicine changes since last GI visit.

## 2021-07-05 ENCOUNTER — Telehealth: Payer: Self-pay | Admitting: Family Medicine

## 2021-07-05 NOTE — Telephone Encounter (Signed)
Left message for patient to call back and schedule Medicare Annual Wellness Visit (AWV) either virtually or in office.   Last AWV 12/31/19  please schedule at anytime with [facility] Nurse Health Advisor 1 or 2   This should be a 45 minute visit. 

## 2021-07-10 ENCOUNTER — Ambulatory Visit (AMBULATORY_SURGERY_CENTER): Payer: Medicare Other | Admitting: Internal Medicine

## 2021-07-10 ENCOUNTER — Encounter: Payer: Self-pay | Admitting: Internal Medicine

## 2021-07-10 ENCOUNTER — Other Ambulatory Visit: Payer: Self-pay

## 2021-07-10 VITALS — BP 115/79 | HR 60 | Temp 97.8°F | Resp 13 | Ht 65.0 in | Wt 213.0 lb

## 2021-07-10 DIAGNOSIS — Z1211 Encounter for screening for malignant neoplasm of colon: Secondary | ICD-10-CM | POA: Diagnosis not present

## 2021-07-10 DIAGNOSIS — Z86010 Personal history of colonic polyps: Secondary | ICD-10-CM

## 2021-07-10 DIAGNOSIS — K635 Polyp of colon: Secondary | ICD-10-CM

## 2021-07-10 DIAGNOSIS — D120 Benign neoplasm of cecum: Secondary | ICD-10-CM

## 2021-07-10 MED ORDER — SODIUM CHLORIDE 0.9 % IV SOLN: 500.0000 mL | Freq: Once | INTRAVENOUS | Status: DC

## 2021-07-10 NOTE — Progress Notes (Signed)
Called to room to assist during endoscopic procedure.  Patient ID and intended procedure confirmed with present staff. Received instructions for my participation in the procedure from the performing physician.  

## 2021-07-10 NOTE — Op Note (Signed)
[place] Patient Name: Guinivere Dercole Procedure Date: 07/10/2021 10:43 AM MRN: [id number] Endoscopist: Docia Chuck. Henrene Pastor , MD Age: 65 Referring MD:  Date of Birth: [birth year] Gender: Female Account #: [id number] Procedure:                Colonoscopy with cold snare polypectomy x 1 Indications:              Screening for colorectal malignant neoplasm. Index                            examination 2012 ([facility]) with hyperplastic                            polyps. Medicines:                Monitored Anesthesia Care Procedure:                Pre-Anesthesia Assessment:                           - Prior to the procedure, a History and Physical                            was performed, and patient medications and                            allergies were reviewed. The patient's tolerance of                            previous anesthesia was also reviewed. The risks                            and benefits of the procedure and the sedation                            options and risks were discussed with the patient.                            All questions were answered, and informed consent                            was obtained. Prior Anticoagulants: The patient has                            taken no previous anticoagulant or antiplatelet                            agents. ASA Grade Assessment: II - A patient with                            mild systemic disease. After reviewing the risks                            and benefits, the patient was deemed in  satisfactory condition to undergo the procedure.                           After obtaining informed consent, the colonoscope                            was passed under direct vision. Throughout the                            procedure, the patient's blood pressure, pulse, and                            oxygen saturations were monitored continuously. The                            Olympus CF-HQ190L  Colonscope was introduced through                            the anus and advanced to the the cecum, identified                            by appendiceal orifice and ileocecal valve. The                            ileocecal valve, appendiceal orifice, and rectum                            were photographed. The quality of the bowel                            preparation was excellent. The colonoscopy was                            performed without difficulty. The patient tolerated                            the procedure well. The bowel preparation used was                            SUPREP via split dose instruction. Scope In: 10:51:47 AM Scope Out: 11:08:11 AM Scope Withdrawal Time: 0 hours 12 minutes 48 seconds  Total Procedure Duration: 0 hours 16 minutes 24 seconds  Findings:                 A 2 mm polyp was found in the cecum. The polyp was                            removed with a cold snare. Resection and retrieval                            were complete.                           The exam was otherwise without abnormality on  direct and retroflexion views. Complications:            No immediate complications. Estimated blood loss:                            None. Estimated Blood Loss:     Estimated blood loss: none. Impression:               - One 2 mm polyp in the cecum, removed with a cold                            snare. Resected and retrieved.                           - The examination was otherwise normal on direct                            and retroflexion views. Recommendation:           - Repeat colonoscopy in 10 years for surveillance.                           - Patient has a contact number available for                            emergencies. The signs and symptoms of potential                            delayed complications were discussed with the                            patient. Return to normal activities tomorrow.                             Written discharge instructions were provided to the                            patient.                           - Resume previous diet.                           - Continue present medications.                           - Await pathology results. Docia Chuck. Henrene Pastor, MD 07/10/2021 11:15:29 AM This report has been signed electronically.

## 2021-07-10 NOTE — Progress Notes (Signed)
Pt Drowsy. VSS. To PACU, report to RN. No anesthetic complications noted.  

## 2021-07-10 NOTE — Patient Instructions (Signed)
Handout on polyps given to you today  Await pathology report on polyp removed     YOU HAD AN ENDOSCOPIC PROCEDURE TODAY AT [place]:   Refer to the procedure report that was given to you for any specific questions about what was found during the examination.  If the procedure report does not answer your questions, please call your gastroenterologist to clarify.  If you requested that your care partner not be given the details of your procedure findings, then the procedure report has been included in a sealed envelope for you to review at your convenience later.  YOU SHOULD EXPECT: Some feelings of bloating in the abdomen. Passage of more gas than usual.  Walking can help get rid of the air that was put into your GI tract during the procedure and reduce the bloating. If you had a lower endoscopy (such as a colonoscopy or flexible sigmoidoscopy) you may notice spotting of blood in your stool or on the toilet paper. If you underwent a bowel prep for your procedure, you may not have a normal bowel movement for a few days.  Please Note:  You might notice some irritation and congestion in your nose or some drainage.  This is from the oxygen used during your procedure.  There is no need for concern and it should clear up in a day or so.  SYMPTOMS TO REPORT IMMEDIATELY:  Following lower endoscopy (colonoscopy or flexible sigmoidoscopy):  Excessive amounts of blood in the stool  Significant tenderness or worsening of abdominal pains  Swelling of the abdomen that is new, acute  Fever of 100F or higher   For urgent or emergent issues, a gastroenterologist can be reached at any hour by calling [phone]. Do not use MyChart messaging for urgent concerns.    DIET:  We do recommend a small meal at first, but then you may proceed to your regular diet.  Drink plenty of fluids but you should avoid alcoholic beverages for 24 hours.  ACTIVITY:  You should plan to take it easy for  the rest of today and you should NOT DRIVE or use heavy machinery until tomorrow (because of the sedation medicines used during the test).    FOLLOW UP: Our staff will call the number listed on your records 48-72 hours following your procedure to check on you and address any questions or concerns that you may have regarding the information given to you following your procedure. If we do not reach you, we will leave a message.  We will attempt to reach you two times.  During this call, we will ask if you have developed any symptoms of COVID 19. If you develop any symptoms (ie: fever, flu-like symptoms, shortness of breath, cough etc.) before then, please call [phone].  If you test positive for Covid 19 in the 2 weeks post procedure, please call and report this information to [place].    If any biopsies were taken you will be contacted by phone or by letter within the next 1-3 weeks.  Please call us at [phone] if you have not heard about the biopsies in 3 weeks.    SIGNATURES/CONFIDENTIALITY: You and/or your care partner have signed paperwork which will be entered into your electronic medical record.  These signatures attest to the fact that that the information above on your After Visit Summary has been reviewed and is understood.  Full responsibility of the confidentiality of this discharge information lies with you and/or your care-partner.

## 2021-07-10 NOTE — Progress Notes (Signed)
VS by CW  Pt's states no medical or surgical changes since previsit or office visit.  

## 2021-07-12 ENCOUNTER — Telehealth: Payer: Self-pay

## 2021-07-12 NOTE — Telephone Encounter (Signed)
Attempted f/u call back. No answer, left VM. 

## 2021-07-12 NOTE — Telephone Encounter (Signed)
Left message on answering machine. 

## 2021-07-14 ENCOUNTER — Telehealth: Payer: Self-pay | Admitting: Pharmacist

## 2021-07-14 ENCOUNTER — Encounter: Payer: Self-pay | Admitting: Internal Medicine

## 2021-07-14 NOTE — Chronic Care Management (AMB) (Signed)
Chronic Care Management Pharmacy Assistant   Name: Shelby Anderson  MRN: [id number] DOB: 1956/09/08  Shelby Anderson is an 65 y.o. year old female who presents for herinitial CCM visit with the clinical pharmacist.  Reason for Encounter: Chart Prep for initial visit on 07/17/21 with [name] the clinical pharmacist.   Conditions to be addressed/monitored: CHF, CAD, HTN, HLD, DMII, Anxiety, and Asthma.  Primary concerns for visit include:   Recent office visits:  02/24/21 [name] MD (PCP) - seen for preventative healthcare and other chronic conditions. Patient given pneumonia vaccine in clinic and prescribed to get the shingles vaccine. Follow up in 6 months.   Recent consult visits:  05/17/21 Acquanetta Sit DPM (Podiatry) -  seen for onychomycosis and routine foot care. No medication changes and follow up in 3 months.  05/04/21 Tye Savoy NP (Gastroenterology) - seen for special screening for malignant neoplasms. Patient started on oral colonoscopy drink. Promethazine changed to just at bedtime daily. Follow up with PCP.  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 No medication changes. Follow up in 3 months.   03/13/21 Celeste Cantwell PA-C (Cardiology) - seen for hypertension and orthostatic hypotension. No medication changes. Follow up in 6 months.   02/06/21 Acquanetta Sit DPM (Podiatry) - seen for onychomycosis and other issues. No medication changes and follow up in 3 months.   02/01/21 [name] MD (Pulmonology) - seen for mild persistent asthma. No medication changes. Follow up in 6 months.   01/30/21 [name] PA-C (Cardiology) - seen for hypertension and other issues. Patient started on metoprolol 74m daily. Changed furosemide to only 254mdaily as needed and discontinued l-methylfolate-algae. Follow up in 6 weeks.  01/25/21 MaNicholaus Bloompain medicine) -  no medication changes or follow up noted.   Hospital visits:  None in previous 6 months  Medications: Outpatient Encounter Medications as of 07/14/2021  Medication Sig   albuterol (PROVENTIL) (2.5 MG/3ML) 0.083% nebulizer solution Take 3 mLs (2.5 mg total) by nebulization every 6 (six) hours as needed for wheezing or shortness of breath (J45.40). (Patient taking differently: Take 2.5 mg by nebulization every 6 (six) hours as needed for wheezing or shortness of breath.)   albuterol (VENTOLIN HFA) 108 (90 Base) MCG/ACT inhaler Inhale 2 puffs into the lungs every 6 (six) hours as needed for wheezing or shortness of breath.   aspirin EC 81 MG tablet Take 81 mg at bedtime by mouth.    atorvastatin (LIPITOR) 40 MG tablet TAKE 1 TABLET BY MOUTH EVERYDAY AT BEDTIME. **DUE FOR YEARLY PHYSICAL**   Blood Glucose Monitoring Suppl (ONE TOUCH ULTRA 2) w/Device KIT USE TO CHECK BLOOD SUGAR 2 TIMES A DAY   budesonide-formoterol (SYMBICORT) 80-4.5 MCG/ACT inhaler INHALE 2 PUFFS INTO THE LUNGS EVERY MORNING AND ANOTHER 2 PUFFS 12 HOURS LATER   buPROPion (WELLBUTRIN XL) 150 MG 24 hr tablet TAKE 1 TABLET BY MOUTH EVERY DAY   Calcium Carbonate-Vitamin D 600-200 MG-UNIT TABS Take 1 tablet by mouth daily.   CINNAMON PO Take 1,000 mg 2 (two) times daily by mouth.   clobetasol cream (TEMOVATE) 0.05 % Apply topically 2 (two) times daily. (Patient taking differently: Apply topically daily as needed (rash).)   doxepin (SINEQUAN) 10 MG capsule Take 20 mg by mouth at bedtime.    esomeprazole (NEXIUM) 40 MG capsule Take 1 capsule (40 mg total) by mouth 2 (two) times daily.   famotidine (PEPCID) 20 MG  tablet Take 1 tablet (20 mg total) by mouth 2 (two) times daily as needed for heartburn or indigestion.   fluticasone (FLONASE) 50 MCG/ACT nasal spray Place 2 sprays at bedtime as needed into both nostrils for allergies or rhinitis.    furosemide (LASIX) 20 MG tablet Take 1 tablet (20 mg total) by mouth daily as needed for fluid.    glipiZIDE (GLUCOTROL XL) 5 MG 24 hr tablet TAKE 1 TABLET BY MOUTH EVERYDAY WITH BREAKFAST. **DUE FOR YEARLY PHYSICAL**   L-Methylfolate (DEPLIN) 7.5 MG TABS Take 7.5 mg by mouth daily with breakfast.   Lancets (ONETOUCH DELICA PLUS [id number]) MISC USE TO CHECK BLOOD SUGAR 2 TIMES A DAY   levothyroxine (SYNTHROID) 50 MCG tablet TAKE 1 TABLET BY MOUTH EVERY DAY   LYRICA 150 MG capsule Take 150 mg by mouth 3 (three) times daily.    metFORMIN (GLUCOPHAGE) 500 MG tablet TAKE 1 TABLET BY MOUTH TWICE A DAY WITH MEALS   metoprolol succinate (TOPROL-XL) 25 MG 24 hr tablet Take 1 tablet (25 mg total) by mouth daily. Take with or immediately following a meal.   Misc Natural Products (GLUCOS-CHONDROIT-MSM COMPLEX PO) Take 1 tablet 3 (three) times daily by mouth.    mometasone (ELOCON) 0.1 % cream Apply topically.   montelukast (SINGULAIR) 10 MG tablet TAKE 1 TABLET BY MOUTH EVERYDAY AT BEDTIME   Multiple Vitamin (MULITIVITAMIN WITH MINERALS) TABS Take 1 tablet by mouth daily.   mupirocin cream (BACTROBAN) 2 % Apply once daily to lesions on feet as needed.   Na Sulfate-K Sulfate-Mg Sulf 17.5-3.13-1.6 GM/177ML SOLN Suprep (no substitutions)-TAKE AS DIRECTED.   nitroGLYCERIN (NITROSTAT) 0.4 MG SL tablet Place 0.4 mg under the tongue every 5 (five) minutes as needed for chest pain.    ondansetron (ZOFRAN) 4 MG tablet Take 1 tablet (4 mg total) by mouth every 8 (eight) hours as needed for nausea or vomiting.   ONETOUCH ULTRA test strip USE TO CHECK BLOOD SUGAR 2 TIMES A DAY   Oxycodone HCl 20 MG TABS Take 20 mg every 4 (four) hours as needed by mouth (pain).    potassium chloride (KLOR-CON) 10 MEQ tablet TAKE 2 TABLETS BY MOUTH EVERY DAY   promethazine (PHENERGAN) 25 MG tablet Take 1 tablet (25 mg total) by mouth at bedtime.   Spacer/Aero-Holding Chambers DEVI 1 Device by Does not apply route daily as needed.   sucralfate (CARAFATE) 1 g tablet DISSOLVE 1 TAB IN 1 TABLESPOON OF DISTILLED WATER FOR 15 MINS. TAKE  4XDAILY WITH MEALS & AT BEDTIME   tiZANidine (ZANAFLEX) 4 MG tablet Take 4 mg by mouth every 8 (eight) hours as needed for muscle spasms.   vitamin B-12 (CYANOCOBALAMIN) 500 MCG tablet Take 500 mcg by mouth daily.    vitamin E 200 UNIT capsule Take 200 Units by mouth daily.   No facility-administered encounter medications on file as of 07/14/2021.   Fill History: ATORVASTATIN 40 MG TABLET 05/18/2021 90   SYMBICORT  80-4.5 MCG/ACT AERO 03/24/2020 30   doxepin 10 mg capsule 06/20/2021 30   NEXIUM DR 40 MG CAPSULE 06/12/2021 30   FAMOTIDINE 20 MG TABLET 05/14/2021 90   FUROSEMIDE 20 MG TABLET 01/12/2021 30   ONETOUCH ULTRA TEST STRIP 05/21/2021 50   L-METHYLFOLATE 7.5 MG TABLET 06/27/2021 30   LEVOTHYROXINE 50 MCG TABLET 03/28/2021 90   METOPROLOL SUCC ER 25 MG TAB 05/12/2021 90   MONTELUKAST SOD 10 MG TABLET 04/20/2021 90   ONDANSETRON HCL 4 MG TABLET 02/24/2021 24  POTASSIUM CL ER 10 MEQ TABLET 06/27/2021 90   PREGABALIN 150 MG CAPSULE 06/11/2021 30   PROMETHAZINE 25 MG TABLET 05/09/2021 90   SUCRALFATE 1 GM TABLET 04/20/2021 90   BUPROPION HCL XL 150 MG TABLET 06/16/2021 90   GLIPIZIDE ER 5 MG TABLET 06/07/2021 90   METFORMIN HCL 500 MG TABLET 05/31/2021 90   OXYCODONE HCL (IR) 15 MG TAB 06/21/2021 30   tizanidine 4 mg tablet 05/15/2021 30    Initial Questions: Have you seen any other providers since your last visit?   Any changes in your medications or health?   Any side effects from any medications?   Do you have an symptoms or problems not managed by your medications?   Any concerns about your health right now?   Has your provider asked that you check blood pressure, blood sugar, or follow special diet at home?   Do you get any type of exercise on a regular basis?   Can you think of a goal you would like to reach for your health?   Do you have any problems getting your medications?   Is there anything that you would like to discuss during the  appointment?   Please bring medications and supplements to appointment.  Unsuccessful at reaching patient.  Care Gaps:  AWV - message left by Ramond Craver to schedule. Zoster vaccines - never done. Covid - 19 vaccine booster 4 - overdue since 11/02/20 Influenza vaccine - due  Star Rating Drugs:  Atorvastatin 56m - last filled on 05/18/21 [facility] at [facility] Glipizide 527m- last filled on 06/07/21 [facility] at [facility] Metformin 50056m [phone]

## 2021-07-17 ENCOUNTER — Ambulatory Visit (INDEPENDENT_AMBULATORY_CARE_PROVIDER_SITE_OTHER): Payer: Medicare Other | Admitting: Pharmacist

## 2021-07-17 ENCOUNTER — Other Ambulatory Visit: Payer: Self-pay

## 2021-07-17 DIAGNOSIS — E785 Hyperlipidemia, unspecified: Secondary | ICD-10-CM

## 2021-07-17 DIAGNOSIS — I152 Hypertension secondary to endocrine disorders: Secondary | ICD-10-CM | POA: Diagnosis not present

## 2021-07-17 DIAGNOSIS — E1169 Type 2 diabetes mellitus with other specified complication: Secondary | ICD-10-CM

## 2021-07-17 DIAGNOSIS — E1159 Type 2 diabetes mellitus with other circulatory complications: Secondary | ICD-10-CM

## 2021-07-17 NOTE — Progress Notes (Signed)
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 inhaler -Counseled on diet and exercise extensively Recommended to continue current medication Assessed patient finances. Plan to apply for formulary or tier exception for Symbicort.  Depression/Anxiety (Goal: minimize symptoms) -Not ideally controlled -Current treatment: Wellbutrin XL 150 mg 1 tablet daily L-methylfolate 7.5 mg daily with food -Medications previously tried/failed: several (unknown names) -  PHQ9: 1 -GAD7: n/a -Educated on Benefits of medication for symptom control Benefits of cognitive-behavioral therapy with or without medication -Recommended to continue current  medication Counseled on possibility of Wellbutrin increasing anxiety.  Insomnia (Goal: improve quality and quantity of sleep) -Not ideally controlled -Current treatment  Doxepin 10 mg 3 capsules at bedtime -Medications previously tried: none  -Counseled on practicing good sleep hygiene by setting a sleep schedule and maintaining it, avoid excessive napping, following a nightly routine, avoiding screen time for 30-60 minutes before going to bed, and making the bedroom a cool, quiet and dark space Patient is not napping and not drinking caffeine.  GERD (Goal: minimize symptoms) -Uncontrolled -Current treatment  Esomeprazole 40 mg 1 capsule twice daily Famotidine 20 mg 1 tablet twice daily as needed Promethazine 25 mg at bedtime Sucralfate 1 g - three times a day (2 meals and 1 at bedtime) -Medications previously tried: n/a  -Counseled on non-pharmacologic management of symptoms such as elevating the head of your bed, avoiding eating 2-3 hours before bed, avoiding triggering foods such as acidic, spicy, or fatty foods, eating smaller meals, and wearing clothes that are loose around the waist   Hypothyroidism (Goal: 2.5-3.5) -Controlled -Current treatment  Levothyroxine 50 mcg 1 tablet daily -Medications previously tried: none  -Counseled on importance of taking on an empty stomach separate from other medications.  Pain (Goal: minimize pain) -Uncontrolled -Current treatment  Lyrica 150 mg 1 capsule three times daily  Oxycodone 20 mg every 4 hours as needed Tizanidine 4 mg every 8 hours as needed (leg spasms) -Medications previously tried: none  -Recommended to continue current medication  Allergic rhinitis (Goal: minimize symptoms) -Controlled -Current treatment  Loratadine 10 mg daily as needed Benadryl  Flonase as needed -Medications previously tried: none  -Recommended to continue current medication   Health Maintenance -Vaccine gaps: shingrix, COVID booster,  influenza -Current therapy:  Potassium 10 mEq 2 tablets daily Cinnamon daily Clobetasol cream 0.05% as needed Fluticasone nasal spray as needed Glucosoamine chondroitin 2 tablets daily Mometasone 0.1% cream Multivitamin daily Zofran as needed Vitamin B12 500 mcg daily Vitamin E 200 unit capsule Promethazine as needed Apple cider vinegar for stomach Biotin 10000 mcg daily -Educated on Herbal supplement research is limited and benefits usually cannot be proven Cost vs benefit of each product must be carefully weighed by individual consumer -Patient is satisfied with current therapy and denies issues -Recommended to continue current medication  Patient Goals/Self-Care Activities Patient will:  - take medications as prescribed check glucose daily, document, and provide at future appointments check blood pressure daily, document, and provide at future appointments weigh daily, and contact provider if weight gain of > 3 lbs in one day or > 5 lbs in one week target a minimum of 150 minutes of moderate intensity exercise weekly  Follow Up Plan: Telephone follow up appointment with care management team member scheduled for:        Medication Assistance:  Provided information for LIS application.  Compliance/Adherence/Medication fill history: Care Gaps: Shingrix, influenza, COVID booster  Star-Rating Drugs: Atorvastatin 57m - last [facility] on 05/18/21 [facility] Glipizide 555m- last filled on 06/07/21 [facility] at [facility] Metformin 50059m last filled on 05/31/21 [facility] at [facility]  Patient's preferred pharmacy is:  [id number] [id number]: [phone]: [id number] [address]  [place] [place] [address] Phone: [phone] Fax: [phone]  Uses pill box? Yes Pt endorses 90% compliance  We discussed: Current  pharmacy is preferred with insurance plan and patient is satisfied with pharmacy services Patient decided to: Continue current medication management strategy  Care Plan and Follow Up Patient Decision:  Patient agrees to Care Plan and Follow-up.  Plan: Telephone follow up appointment with care management team member scheduled for:  3 months  [name], PharmD, [place] Pharmacist [facility] [place] [phone]

## 2021-07-17 NOTE — Patient Instructions (Signed)
Hi Gita,  It was great to get to meet you in person! Below is a summary of some of the topics we discussed.   Please make sure to supplement with vitamin D as we discussed and increase the calcium in your diet.  Don't forget to apply for Medicare extra help: DictionaryDirectory.es.action or call 3868592685 to walk you through the application and let me know if you have questions while filling out the application.  Please reach out to me if you have any questions or need anything before our follow up!  Best, Maddie  [name], PharmD, [facility] [place]   Visit Information   Goals Addressed   None    Patient Care Plan: CCM Pharmacy Care Plan     Problem Identified: Problem: Hypertension, Hyperlipidemia, Diabetes, Coronary Artery Disease, GERD, Asthma, Hypothyroidism, Depression, and Allergic Rhinitis      Long-Range Goal: Patient-Specific Goal   Start Date: 07/17/2021  Expected End Date: 07/17/2022  This Visit's Progress: On track  Priority: High  Note:   Current Barriers:  Unable to independently afford treatment regimen Unable to independently monitor therapeutic efficacy Unable to achieve control of cholesterol   Pharmacist Clinical Goal(s):  Patient will verbalize ability to afford treatment regimen achieve adherence to monitoring guidelines and medication adherence to achieve therapeutic efficacy achieve control of cholesterol as evidenced by next lipid panel  through collaboration with PharmD and provider.   Interventions: 1:1 collaboration with Caren Macadam, MD regarding development and update of comprehensive plan of care as evidenced by provider attestation and co-signature Inter-disciplinary care team collaboration (see longitudinal plan of care) Comprehensive medication review performed; medication list updated in electronic medical record  Hypertension (BP goal  <130/80) -Controlled -Current treatment: Metoprolol succinate 25 mg 1 tablet daily -Medications previously tried: none  -Current home readings: 130-140s/80-90s; checking once or twice a day -Current dietary habits: doesn't use any salt; chicken and porchops or ribs; no red meat in a while; 1-2 vegetables every day; not much fruit -Current exercise habits: walking some -Reports hypotensive/hypertensive symptoms -Educated on BP goals and benefits of medications for prevention of heart attack, stroke and kidney damage; Exercise goal of 150 minutes per week; Importance of home blood pressure monitoring; Proper BP monitoring technique; Symptoms of hypotension and importance of maintaining adequate hydration; -Counseled to monitor BP at home daily, document, and provide log at future appointments -Counseled on diet and exercise extensively Recommended to continue current medication  Hyperlipidemia: (LDL goal < 70) -Controlled -Current treatment: Atorvastatin 40 mg 1 tablet at bedtime -Medications previously tried: fish oil (cardiologist removed this)  -Current dietary patterns: refer to above -Current exercise habits: walking some -Educated on Cholesterol goals;  Benefits of statin for ASCVD risk reduction; Importance of limiting foods high in cholesterol; Exercise goal of 150 minutes per week; -Counseled on diet and exercise extensively Recommended to continue current medication Recommended repeat lipid panel.  CAD (Goal: prevent heart events) -Controlled -Current treatment  Aspirin 81 mg 1 tablet daily Atorvastatin 40 mg 1 tablet at bedtime Nitroglycerin 0.4 mg SL 1 tablet as needed -Medications previously tried: none  -Recommended verifying expiration date on nitroglycerin. Requested a refill for medication.  Heart Failure (Goal: manage symptoms and prevent exacerbations) -Controlled -Last ejection fraction: 60-65% (Date: 10/01/19) -HF type: Diastolic -NYHA Class: II  (slight limitation of activity) -AHA HF Stage: B (Heart disease present - no symptoms present) -Current treatment: Furosemide 20 mg 1 tablet as needed Metoprolol succinate 25 mg 1  tablet daily -Medications previously tried: none  -Current home BP/HR readings: refer to above -Current dietary habits:  limits salt intake -Current exercise habits: some walking -Educated on Benefits of medications for managing symptoms and prolonging life Importance of weighing daily; if you gain more than 3 pounds in one day or 5 pounds in one week, call cardiologist Proper diuretic administration and potassium supplementation Importance of blood pressure control -Counseled on diet and exercise extensively Recommended to continue current medication   Diabetes (A1c goal <7%) -Uncontrolled -Current medications: Metformin 500 mg 1 tablet twice daily Glipizide 5 mg 1 tablet with breakfast -Medications previously tried: none  -Current home glucose readings fasting glucose:  110-125 (twice a day); 2-3 hours after eating   post prandial glucose: does not check -Denies hypoglycemic/hyperglycemic symptoms -Current meal patterns:  breakfast: n/a lunch: n/a dinner: n/a snacks: no snacks; most of the time no desserts; cut back on junk food (individual bags for kids); fig newtons drinks: water -Current exercise: some walking -Educated on A1c and blood sugar goals; Exercise goal of 150 minutes per week; Carbohydrate counting and/or plate method -Counseled to check feet daily and get yearly eye exams -Counseled on diet and exercise extensively Recommended increasing metformin to 1000 mg twice daily.  Asthma (Goal: control symptoms) -Uncontrolled -Current treatment  Montelukast 10 mg 1 tablet at bedtime Symbicort 804.5 mcg/act 2 puffs twice daily Albuterol HFA as needed Albuterol nebulizer as needed -Medications previously tried: none  -Pulmonary function testing: 07/2018 -Exacerbations requiring  treatment in last 6 months: none -Patient reports consistent use of maintenance inhaler -Frequency of rescue inhaler use: a few times a week -Counseled on Proper inhaler technique; Benefits of consistent maintenance inhaler use When to use rescue inhaler -Counseled on diet and exercise extensively Recommended to continue current medication Assessed patient finances. Plan to apply for formulary or tier exception for Symbicort.  Depression/Anxiety (Goal: minimize symptoms) -Not ideally controlled -Current treatment: Wellbutrin XL 150 mg 1 tablet daily L-methylfolate 7.5 mg daily with food -Medications previously tried/failed: several (unknown names) -PHQ9: 1 -GAD7: n/a -Educated on Benefits of medication for symptom control Benefits of cognitive-behavioral therapy with or without medication -Recommended to continue current medication Counseled on possibility of Wellbutrin increasing anxiety.  Insomnia (Goal: improve quality and quantity of sleep) -Not ideally controlled -Current treatment  Doxepin 10 mg 3 capsules at bedtime -Medications previously tried: none  -Counseled on practicing good sleep hygiene by setting a sleep schedule and maintaining it, avoid excessive napping, following a nightly routine, avoiding screen time for 30-60 minutes before going to bed, and making the bedroom a cool, quiet and dark space Patient is not napping and not drinking caffeine.  GERD (Goal: minimize symptoms) -Uncontrolled -Current treatment  Esomeprazole 40 mg 1 capsule twice daily Famotidine 20 mg 1 tablet twice daily as needed Promethazine 25 mg at bedtime Sucralfate 1 g - three times a day (2 meals and 1 at bedtime) -Medications previously tried: n/a  -Counseled on non-pharmacologic management of symptoms such as elevating the head of your bed, avoiding eating 2-3 hours before bed, avoiding triggering foods such as acidic, spicy, or fatty foods, eating smaller meals, and wearing clothes  that are loose around the waist   Hypothyroidism (Goal: 2.5-3.5) -Controlled -Current treatment  Levothyroxine 50 mcg 1 tablet daily -Medications previously tried: none  -Counseled on importance of taking on an empty stomach separate from other medications.  Pain (Goal: minimize pain) -Uncontrolled -Current treatment  Lyrica 150 mg 1 capsule three times daily  Oxycodone 20 mg every 4 hours as needed Tizanidine 4 mg every 8 hours as needed (leg spasms) -Medications previously tried: none  -Recommended to continue current medication  Allergic rhinitis (Goal: minimize symptoms) -Controlled -Current treatment  Loratadine 10 mg daily as needed Benadryl  Flonase as needed -Medications previously tried: none  -Recommended to continue current medication   Health Maintenance -Vaccine gaps: shingrix, COVID booster, influenza -Current therapy:  Potassium 10 mEq 2 tablets daily Cinnamon daily Clobetasol cream 0.05% as needed Fluticasone nasal spray as needed Glucosoamine chondroitin 2 tablets daily Mometasone 0.1% cream Multivitamin daily Zofran as needed Vitamin B12 500 mcg daily Vitamin E 200 unit capsule Promethazine as needed Apple cider vinegar for stomach Biotin 10000 mcg daily -Educated on Herbal supplement research is limited and benefits usually cannot be proven Cost vs benefit of each product must be carefully weighed by individual consumer -Patient is satisfied with current therapy and denies issues -Recommended to continue current medication  Patient Goals/Self-Care Activities Patient will:  - take medications as prescribed check glucose daily, document, and provide at future appointments check blood pressure daily, document, and provide at future appointments weigh daily, and contact provider if weight gain of > 3 lbs in one day or > 5 lbs in one week target a minimum of 150 minutes of moderate intensity exercise weekly  Follow Up Plan: Telephone follow up  appointment with care management team member scheduled for:      Ms. Anis was given information about Chronic Care Management services today including:  CCM service includes personalized support from designated clinical staff supervised by her physician, including individualized plan of care and coordination with other care providers 24/7 contact phone numbers for assistance for urgent and routine care needs. Standard insurance, coinsurance, copays and deductibles apply for chronic care management only during months in which we provide at least 20 minutes of these services. Most insurances cover these services at 100%, however patients may be responsible for any copay, coinsurance and/or deductible if applicable. This service may help you avoid the need for more expensive face-to-face services. Only one practitioner may furnish and bill the service in a calendar month. The patient may stop CCM services at any time (effective at the end of the month) by phone call to the office staff.  Patient agreed to services and verbal consent obtained.   The patient verbalized understanding of instructions, educational materials, and care plan provided today and agreed to receive a mailed copy of patient instructions, educational materials, and care plan.  Telephone follow up appointment with pharmacy team member scheduled for:3 months  Viona Gilmore, [facility]

## 2021-07-18 ENCOUNTER — Ambulatory Visit (INDEPENDENT_AMBULATORY_CARE_PROVIDER_SITE_OTHER): Payer: Medicare Other

## 2021-07-18 VITALS — BP 110/70 | HR 82 | Temp 98.3°F | Wt 214.6 lb

## 2021-07-18 DIAGNOSIS — Z599 Problem related to housing and economic circumstances, unspecified: Secondary | ICD-10-CM

## 2021-07-18 DIAGNOSIS — Z0000 Encounter for general adult medical examination without abnormal findings: Secondary | ICD-10-CM

## 2021-07-18 NOTE — Patient Instructions (Signed)
Shelby Anderson , Thank you for taking time to come for your Medicare Wellness Visit. I appreciate your ongoing commitment to your health goals. Please review the following plan we discussed and let me know if I can assist you in the future.   Screening recommendations/referrals: Colonoscopy: Done 07/10/21 Mammogram: Done 12/09/2020 repeat every year Bone Density: Done 03/17/15 repeat every 2 years  Recommended yearly ophthalmology/optometry visit for glaucoma screening and checkup Recommended yearly dental visit for hygiene and checkup  Vaccinations: Influenza vaccine: Due Pneumococcal vaccine: Completed  Tdap vaccine: Done 06/28/14 repeat every 10 years due 06/28/14 Shingles vaccine: Shingrix discussed. Please contact your pharmacy for coverage information.   Covid-19: Completed 4/9, 5/7, & 08/03/20  Advanced directives: Please bring a copy of your health care power of attorney and living will to the office at your convenience.  Conditions/risks identified: lose milk  Next appointment: Follow up in one year for your annual wellness visit.   Preventive Care 40-64 Years, Female Preventive care refers to lifestyle choices and visits with your health care provider that can promote health and wellness. What does preventive care include? A yearly physical exam. This is also called an annual well check. Dental exams once or twice a year. Routine eye exams. Ask your health care provider how often you should have your eyes checked. Personal lifestyle choices, including: Daily care of your teeth and gums. Regular physical activity. Eating a healthy diet. Avoiding tobacco and drug use. Limiting alcohol use. Practicing safe sex. Taking low-dose aspirin daily starting at age 30. Taking vitamin and mineral supplements as recommended by your health care provider. What happens during an annual well check? The services and screenings done by your health care provider during your annual well check will  depend on your age, overall health, lifestyle risk factors, and family history of disease. Counseling  Your health care provider may ask you questions about your: Alcohol use. Tobacco use. Drug use. Emotional well-being. Home and relationship well-being. Sexual activity. Eating habits. Work and work Statistician. Method of birth control. Menstrual cycle. Pregnancy history. Screening  You may have the following tests or measurements: Height, weight, and BMI. Blood pressure. Lipid and cholesterol levels. These may be checked every 5 years, or more frequently if you are over 29 years old. Skin check. Lung cancer screening. You may have this screening every year starting at age 15 if you have a 30-pack-year history of smoking and currently smoke or have quit within the past 15 years. Fecal occult blood test (FOBT) of the stool. You may have this test every year starting at age 25. Flexible sigmoidoscopy or colonoscopy. You may have a sigmoidoscopy every 5 years or a colonoscopy every 10 years starting at age 6. Hepatitis C blood test. Hepatitis B blood test. Sexually transmitted disease (STD) testing. Diabetes screening. This is done by checking your blood sugar (glucose) after you have not eaten for a while (fasting). You may have this done every 1-3 years. Mammogram. This may be done every 1-2 years. Talk to your health care provider about when you should start having regular mammograms. This may depend on whether you have a family history of breast cancer. BRCA-related cancer screening. This may be done if you have a family history of breast, ovarian, tubal, or peritoneal cancers. Pelvic exam and Pap test. This may be done every 3 years starting at age 18. Starting at age 72, this may be done every 5 years if you have a Pap test in combination with an  HPV test. Bone density scan. This is done to screen for osteoporosis. You may have this scan if you are at high risk for  osteoporosis. Discuss your test results, treatment options, and if necessary, the need for more tests with your health care provider. Vaccines  Your health care provider may recommend certain vaccines, such as: Influenza vaccine. This is recommended every year. Tetanus, diphtheria, and acellular pertussis (Tdap, Td) vaccine. You may need a Td booster every 10 years. Zoster vaccine. You may need this after age 29. Pneumococcal 13-valent conjugate (PCV13) vaccine. You may need this if you have certain conditions and were not previously vaccinated. Pneumococcal polysaccharide (PPSV23) vaccine. You may need one or two doses if you smoke cigarettes or if you have certain conditions. Talk to your health care provider about which screenings and vaccines you need and how often you need them. This information is not intended to replace advice given to you by your health care provider. Make sure you discuss any questions you have with your health care provider. Document Released: 12/16/2015 Document Revised: 08/08/2016 Document Reviewed: 09/20/2015 Elsevier Interactive Patient Education  2017 [employer] Prevention in the Home Falls can cause injuries. They can happen to people of all ages. There are many things you can do to make your home safe and to help prevent falls. What can I do on the outside of my home? Regularly fix the edges of walkways and driveways and fix any cracks. Remove anything that might make you trip as you walk through a door, such as a raised step or threshold. Trim any bushes or trees on the path to your home. Use bright outdoor lighting. Clear any walking paths of anything that might make someone trip, such as rocks or tools. Regularly check to see if handrails are loose or broken. Make sure that both sides of any steps have handrails. Any raised decks and porches should have guardrails on the edges. Have any leaves, snow, or ice cleared regularly. Use sand or  salt on walking paths during winter. Clean up any spills in your garage right away. This includes oil or grease spills. What can I do in the bathroom? Use night lights. Install grab bars by the toilet and in the tub and shower. Do not use towel bars as grab bars. Use non-skid mats or decals in the tub or shower. If you need to sit down in the shower, use a plastic, non-slip stool. Keep the floor dry. Clean up any water that spills on the floor as soon as it happens. Remove soap buildup in the tub or shower regularly. Attach bath mats securely with double-sided non-slip rug tape. Do not have throw rugs and other things on the floor that can make you trip. What can I do in the bedroom? Use night lights. Make sure that you have a light by your bed that is easy to reach. Do not use any sheets or blankets that are too big for your bed. They should not hang down onto the floor. Have a firm chair that has side arms. You can use this for support while you get dressed. Do not have throw rugs and other things on the floor that can make you trip. What can I do in the kitchen? Clean up any spills right away. Avoid walking on wet floors. Keep items that you use a lot in easy-to-reach places. If you need to reach something above you, use a strong step stool that has a  grab bar. Keep electrical cords out of the way. Do not use floor polish or wax that makes floors slippery. If you must use wax, use non-skid floor wax. Do not have throw rugs and other things on the floor that can make you trip. What can I do with my stairs? Do not leave any items on the stairs. Make sure that there are handrails on both sides of the stairs and use them. Fix handrails that are broken or loose. Make sure that handrails are as long as the stairways. Check any carpeting to make sure that it is firmly attached to the stairs. Fix any carpet that is loose or worn. Avoid having throw rugs at the top or bottom of the stairs. If  you do have throw rugs, attach them to the floor with carpet tape. Make sure that you have a light switch at the top of the stairs and the bottom of the stairs. If you do not have them, ask someone to add them for you. What else can I do to help prevent falls? Wear shoes that: Do not have high heels. Have rubber bottoms. Are comfortable and fit you well. Are closed at the toe. Do not wear sandals. If you use a stepladder: Make sure that it is fully opened. Do not climb a closed stepladder. Make sure that both sides of the stepladder are locked into place. Ask someone to hold it for you, if possible. Clearly mark and make sure that you can see: Any grab bars or handrails. First and last steps. Where the edge of each step is. Use tools that help you move around (mobility aids) if they are needed. These include: Canes. Walkers. Scooters. Crutches. Turn on the lights when you go into a dark area. Replace any light bulbs as soon as they burn out. Set up your furniture so you have a clear path. Avoid moving your furniture around. If any of your floors are uneven, fix them. If there are any pets around you, be aware of where they are. Review your medicines with your doctor. Some medicines can make you feel dizzy. This can increase your chance of falling. Ask your doctor what other things that you can do to help prevent falls. This information is not intended to replace advice given to you by your health care provider. Make sure you discuss any questions you have with your health care provider. Document Released: 09/15/2009 Document Revised: 04/26/2016 Document Reviewed: 12/24/2014 Elsevier Interactive Patient Education  2017 [employer].

## 2021-07-18 NOTE — Progress Notes (Addendum)
Subjective:   SHAKTHI SCIPIO is a 65 y.o. female who presents for Medicare Annual (Subsequent) preventive examination.  Review of Systems     Cardiac Risk Factors include: hypertension;dyslipidemia;diabetes mellitus;obesity (BMI >30kg/m2)     Objective:    Today's Vitals   07/18/21 1527  BP: 110/70  Pulse: 82  Temp: 98.3 F (36.8 C)  SpO2: 93%  Weight: 214 lb 9.6 oz (97.3 kg)   Body mass index is 35.71 kg/m.  Advanced Directives 07/18/2021 12/31/2019 12/08/2019 11/04/2019 10/31/2019 10/30/2019 10/01/2019  Does Patient Have a Medical Advance Directive? Yes No Yes Yes Yes Yes Yes  Type of [employer] Power of [place];Living will Living will Healthcare Power of [place]  Does patient want to make changes to medical advance directive? - - No - Patient declined No - Patient declined No - Patient declined - No - Patient declined  Copy of [place] in Chart? - - No - copy requested - - - No - copy requested  Would patient like information on creating a medical advance directive? - No - Patient declined - - - - No - Patient declined  Pre-existing out of facility DNR order (yellow form or pink MOST form) - - - - - - -    Current Medications (verified) Outpatient Encounter Medications as of 07/18/2021  Medication Sig   aspirin EC 81 MG tablet Take 81 mg at bedtime by mouth.    atorvastatin (LIPITOR) 40 MG tablet TAKE 1 TABLET BY MOUTH EVERYDAY AT BEDTIME. **DUE FOR YEARLY PHYSICAL**   Blood Glucose Monitoring Suppl (ONE TOUCH ULTRA 2) w/Device KIT USE TO CHECK BLOOD SUGAR 2 TIMES A DAY   budesonide-formoterol (SYMBICORT) 80-4.5 MCG/ACT inhaler INHALE 2 PUFFS INTO THE LUNGS EVERY MORNING AND ANOTHER 2 PUFFS 12 HOURS LATER   buPROPion (WELLBUTRIN XL) 150 MG 24 hr tablet TAKE 1 TABLET BY MOUTH EVERY DAY   Calcium Carbonate-Vitamin D 600-200 MG-UNIT TABS Take 1  tablet by mouth daily.   CINNAMON PO Take 1,000 mg 2 (two) times daily by mouth.   doxepin (SINEQUAN) 10 MG capsule Take 30 mg by mouth at bedtime.   esomeprazole (NEXIUM) 40 MG capsule Take 1 capsule (40 mg total) by mouth 2 (two) times daily.   famotidine (PEPCID) 20 MG tablet Take 1 tablet (20 mg total) by mouth 2 (two) times daily as needed for heartburn or indigestion.   fluticasone (FLONASE) 50 MCG/ACT nasal spray Place 2 sprays at bedtime as needed into both nostrils for allergies or rhinitis.    furosemide (LASIX) 20 MG tablet Take 1 tablet (20 mg total) by mouth daily as needed for fluid.   glipiZIDE (GLUCOTROL XL) 5 MG 24 hr tablet TAKE 1 TABLET BY MOUTH EVERYDAY WITH BREAKFAST. **DUE FOR YEARLY PHYSICAL**   L-Methylfolate (DEPLIN) 7.5 MG TABS Take 7.5 mg by mouth daily with breakfast.   Lancets (ONETOUCH DELICA PLUS [id number]) MISC USE TO CHECK BLOOD SUGAR 2 TIMES A DAY   levothyroxine (SYNTHROID) 50 MCG tablet TAKE 1 TABLET BY MOUTH EVERY DAY   LYRICA 150 MG capsule Take 150 mg by mouth 3 (three) times daily.    metFORMIN (GLUCOPHAGE) 500 MG tablet TAKE 1 TABLET BY MOUTH TWICE A DAY WITH MEALS   metoprolol succinate (TOPROL-XL) 25 MG 24 hr tablet Take 1 tablet (25 mg total) by mouth daily. Take with or immediately following a meal.   Misc Natural  Products (GLUCOS-CHONDROIT-MSM COMPLEX PO) Take 2 tablets by mouth daily.   mometasone (ELOCON) 0.1 % cream Apply topically.   montelukast (SINGULAIR) 10 MG tablet TAKE 1 TABLET BY MOUTH EVERYDAY AT BEDTIME   Multiple Vitamin (MULITIVITAMIN WITH MINERALS) TABS Take 1 tablet by mouth daily.   mupirocin cream (BACTROBAN) 2 % Apply once daily to lesions on feet as needed.   ondansetron (ZOFRAN) 4 MG tablet Take 1 tablet (4 mg total) by mouth every 8 (eight) hours as needed for nausea or vomiting.   ONETOUCH ULTRA test strip USE TO CHECK BLOOD SUGAR 2 TIMES A DAY   Oxycodone HCl 20 MG TABS Take 15 mg by mouth every 4 (four) hours as needed  (pain).   potassium chloride (KLOR-CON) 10 MEQ tablet TAKE 2 TABLETS BY MOUTH EVERY DAY   promethazine (PHENERGAN) 25 MG tablet Take 1 tablet (25 mg total) by mouth at bedtime.   Spacer/Aero-Holding Chambers DEVI 1 Device by Does not apply route daily as needed.   sucralfate (CARAFATE) 1 g tablet DISSOLVE 1 TAB IN 1 TABLESPOON OF DISTILLED WATER FOR 15 MINS. TAKE 4XDAILY WITH MEALS & AT BEDTIME   tiZANidine (ZANAFLEX) 4 MG tablet Take 4 mg by mouth every 8 (eight) hours as needed for muscle spasms.   vitamin B-12 (CYANOCOBALAMIN) 500 MCG tablet Take 500 mcg by mouth daily.    vitamin E 200 UNIT capsule Take 200 Units by mouth daily.   albuterol (PROVENTIL) (2.5 MG/3ML) 0.083% nebulizer solution Take 3 mLs (2.5 mg total) by nebulization every 6 (six) hours as needed for wheezing or shortness of breath (J45.40). (Patient not taking: Reported on 07/18/2021)   albuterol (VENTOLIN HFA) 108 (90 Base) MCG/ACT inhaler Inhale 2 puffs into the lungs every 6 (six) hours as needed for wheezing or shortness of breath. (Patient not taking: Reported on 07/18/2021)   nitroGLYCERIN (NITROSTAT) 0.4 MG SL tablet Place 0.4 mg under the tongue every 5 (five) minutes as needed for chest pain.  (Patient not taking: No sig reported)   No facility-administered encounter medications on file as of 07/18/2021.    Allergies (verified) Lodine [etodolac], Oxycontin [oxycodone hcl], Penicillins, Aspirin, Darvocet [propoxyphene n-acetaminophen], Nitroglycerin, Tramadol, Ultram [tramadol hcl], and Valium   History: Past Medical History:  Diagnosis Date   Allergy    Anemia    "chronic"   Angina    Prinzmetal angina   Anxiety and depression    Arthritis    Asthma    Atrial fibrillation ([place])    h/o "AF w/frequent PVCs"   Breast cancer ([place])    Left   Cancer (HCC)    hx of skin cancer    CHF (congestive heart failure) (HCC)    Chronic back pain greater than 3 months duration    on chronic narcotics, treated at pain  clinic   Chronic respiratory failure ([place]) 09/15/2015   ONO 09/04/15 + desats >begin O2 at 2l/ m    Colon polyps    hyperplastic   Coronary artery disease    Arrythmia, orthostatic hypotension, HLD, HTN; sees Dr. Einar Gip   Difficult intubation    "TMJ & woke up when they were still cutting on me"   Dysrhythmia    sees Dr. 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 Einar Gip   OSA (obstructive sleep apnea)    Personal history of chemotherapy    Personal history of radiation therapy    Pneumonia    multiple times   PONV (postoperative nausea and vomiting)    Recurrent upper respiratory infection (URI)    Shortness of breath 11/20/11   "all the time", sees pulmonlogy, ? asthma   Stenotic cervical os    Stomach ulcer    "3 small; found in 05/2011"   TMJ (dislocation of temporomandibular joint)    Tuberculosis    + TB SKIN TEST   Type 2 diabetes mellitus without complication, without long-term current use of insulin ([place]) 12/06/2015   not on meds    Past Surgical History:  Procedure Laterality Date   ACHILLES TENDON REPAIR  1970's   left ankle   ARTHROSCOPIC REPAIR ACL     left knee cap   BREAST BIOPSY Right 04/08/2013   again in October or November 2020   BREAST LUMPECTOMY WITH RADIOACTIVE SEED AND SENTINEL LYMPH NODE BIOPSY Left 12/09/2019   Procedure: LEFT BREAST LUMPECTOMY WITH RADIOACTIVE SEED AND  LEFT AXILLARY SENTINEL LYMPH NODE BIOPSY;  Surgeon: Rolm Bookbinder, MD;  Location: [place];  Service: General;  Laterality: Left;  PEC BLOCK   CARDIAC CATHETERIZATION     loop recorder   CARPAL TUNNEL RELEASE  unknown   left hand   COLONOSCOPY     ESOPHAGOGASTRODUODENOSCOPY (EGD) WITH PROPOFOL N/A 10/29/2017   Procedure: ESOPHAGOGASTRODUODENOSCOPY (EGD) WITH PROPOFOL;  Surgeon: Irene Shipper, MD;  Location: [facility];  Service: Endoscopy;  Laterality: N/A;   LOOP RECORDER IMPLANT     PORT-A-CATH REMOVAL N/A 12/09/2019   Procedure: REMOVAL PORT-A-CATH;  Surgeon: Rolm Bookbinder, MD;  Location: [place];  Service: General;  Laterality: N/A;   PORTACATH PLACEMENT N/A 09/23/2019   Procedure: INSERTION PORT-A-CATH Right Internal Doreen Salvage;  Surgeon: Rolm Bookbinder, MD;  Location: [facility] OR;  Service: General;  Laterality: N/A;   post ganglionectomy  1970's   "for migraine headaches"   pouch string  40,98,11   "did this 3 times (once w/each pregnancy)"   SAVORY DILATION N/A 10/29/2017   Procedure: SAVORY DILATION;  Surgeon: Irene Shipper, MD;  Location: [facility];  Service: Endoscopy;  Laterality: N/A;   TOTAL KNEE ARTHROPLASTY Left 09/25/2016   Procedure: LEFT TOTAL KNEE ARTHROPLASTY;  Surgeon: Paralee Cancel, MD;  Location: [facility];  Service: Orthopedics;  Laterality: Left;   TUBAL LIGATION  13's   Family History  Problem Relation Age of Onset   Malignant hyperthermia Father    Hypertension Father    Heart disease Father    Diabetes Father    Cancer Father        skin   Hypertension Mother    Heart disease Mother    Multiple myeloma Mother    Cancer Sister        CERVICAL   Hypertension Sister    Cancer Brother 55  MELANOMA   Heart disease Maternal Grandmother    Other Maternal Grandmother 32       complications of childbirth   Heart disease Maternal Grandfather    Cancer Paternal Grandmother        ?    Heart disease Paternal Grandmother    Heart  disease Paternal Grandfather    Cancer Brother        LUNG   Diabetes Sister    Hypertension Sister    Heart disease Sister    Cancer Sister    Cancer Brother    Pancreatic cancer Niece 83   Cancer Nephew 63       unknown- currently in the [employer]   Anesthesia problems Neg Hx    Hypotension Neg Hx    Pseudochol deficiency Neg Hx    Colon cancer Neg Hx    Esophageal cancer Neg Hx    Rectal cancer Neg Hx    Stomach cancer Neg Hx    Breast cancer Neg Hx    Social History   Socioeconomic History   Marital status: Married    Spouse name: Not on file   Number of children: 3   Years of education: 15   Highest education level: Some college, no degree  Occupational History   Occupation: Retired Therapist, sports  Tobacco Use   Smoking status: Never   Smokeless tobacco: Never  Vaping Use   Vaping Use: Never used  Substance and Sexual Activity   Alcohol use: No    Alcohol/week: 0.0 standard drinks   Drug use: No   Sexual activity: Not Currently    Birth control/protection: Surgical    Comment: 1st intercourse 65 yo-Fewer than 5 partners  Other Topics Concern   Not on file  Social History Narrative   Right handed   One story home   3 children   Has custody of 2 grandchildren ages 11 & 41    Social Determinants of Health   Financial Resource Strain: High Risk   Difficulty of Paying Living Expenses: Very hard  Food Insecurity: Landscape architect Present   Worried About Charity fundraiser in the Last Year: Sometimes true   Arboriculturist in the Last Year: Sometimes true  Transportation Needs: No Transportation Needs   Lack of Transportation (Medical): No   Lack of Transportation (Non-Medical): No  Physical Activity: Sufficiently Active   Days of Exercise per Week: 5 days   Minutes of Exercise per Session: 60 min  Stress: Stress Concern Present   Feeling of Stress : Rather much  Social Connections: Moderately Isolated   Frequency of Communication with Friends and Family: Once a week    Frequency of Social Gatherings with Friends and Family: Once a week   Attends Religious Services: More than 4 times per year   Active Member of [employer] or Organizations: No   Attends Music therapist: Never   Marital Status: Married    Tobacco Counseling Counseling given: Not Answered   Clinical Intake:  Pre-visit preparation completed: Yes  Pain : No/denies pain     BMI - recorded: 35.71 Nutritional Status: BMI > 30  Obese Nutritional Risks: Nausea/ vomitting/ diarrhea (loose stool and nausea) Diabetes: Yes CBG done?: Yes (127) CBG resulted in Enter/ Edit results?: No Did pt. bring in CBG monitor from home?: No  How often do you need to have someone help you when you read instructions, pamphlets, or other written materials from your doctor or pharmacy?: 1 - Never  Diabetic?Nutrition Risk Assessment:  Has the patient had any N/V/D within the last 2 months?  Yes  Does the patient have any non-healing wounds?  Yes  left foot below little toe, pt followed up with podiatrist  Has the patient had any unintentional weight loss or weight gain?  No   Diabetes:  Is the patient diabetic?  Yes  If diabetic, was a CBG obtained today?  Yes  Did the patient bring in their glucometer from home?  No  How often do you monitor your CBG's? Daily.   Financial Strains and Diabetes Management:  Are you having any financial strains with the device, your supplies or your medication? Yes .  Does the patient want to be seen by Chronic Care Management for management of their diabetes?  No  Would the patient like to be referred to a Nutritionist or for Diabetic Management?  No   Diabetic Exams:  Diabetic Eye Exam: Completed 10/03/20 Diabetic Foot Exam: Completed 09/30/20   Interpreter Needed?: No  Information entered by :: Charlott Rakes, LPN   Activities of Daily Living In your present state of health, do you have any difficulty performing the following activities:  07/18/2021  Hearing? Y  Comment mild loss  Vision? N  Difficulty concentrating or making decisions? N  Walking or climbing stairs? Y  Comment use hand rail  Dressing or bathing? N  Doing errands, shopping? N  Preparing Food and eating ? N  Using the Toilet? N  In the past six months, have you accidently leaked urine? N  Do you have problems with loss of bowel control? N  Managing your Medications? N  Managing your Finances? N  Housekeeping or managing your Housekeeping? N  Some recent data might be hidden    Patient Care Team: Caren Macadam, MD as PCP - General (Family Medicine) Rigoberto Noel, MD as Consulting Physician (Pulmonary Disease) Adrian Prows, MD as Consulting Physician (Cardiology) Lenon Oms, MD as Referring Physician (Obstetrics and Gynecology) Veneda Melter, MD as Referring Physician (Cardiology) [name], MD as Consulting Physician (Anesthesiology) Rolm Bookbinder, MD as Consulting Physician (General Surgery) Nicholas Lose, MD as Consulting Physician (Hematology and Oncology) Kyung Rudd, MD as Consulting Physician (Radiation Oncology) Alda Berthold, DO as Consulting Physician (Neurology) Jarome Matin, MD as Consulting Physician (Dermatology) Viona Gilmore, [facility] as Pharmacist (Pharmacist)  Indicate any recent Medical Services you may have received from other than [facility] providers in the past year (date may be approximate).     Assessment:   This is a routine wellness examination for Keaisha.  Hearing/Vision screen Hearing Screening - Comments:: Pt stated mild loss  Vision Screening - Comments:: Pt follows up with Dr Katy Fitch  for annual eye exams   Dietary issues and exercise activities discussed: Current Exercise Habits: Home exercise routine, Type of exercise: walking, Time (Minutes): 30, Frequency (Times/Week): 5, Weekly Exercise (Minutes/Week): 150   Goals Addressed             This Visit's Progress    Patient Stated        Lose weight        Depression Screen PHQ 2/9 Scores 07/18/2021 02/24/2021 01/06/2021 12/31/2019 11/04/2019 07/27/2019 04/09/2019  PHQ - 2 Score '1 2 3 4 3 4 2  ' PHQ- 9 Score - '6 8 15 11 12 8    ' Fall Risk Fall Risk  07/18/2021 02/24/2021 12/31/2019 12/25/2019 11/04/2019  Falls in the past year? '1 1 1 ' - 1  Number falls in  past yr: - 1 1 0 1  Injury with Fall? '1 1 1 1 ' 0  Comment head injury - - - -  Risk Factor Category  - - - - -  Risk for fall due to : Impaired balance/gait;Impaired mobility;Impaired vision;History of fall(s) - History of fall(s);Medication side effect;Other (Comment) Impaired balance/gait History of fall(s)  Risk for fall due to: Comment - - dizziness and recent cancer diagnosis - -  Follow up Falls prevention discussed - Falls evaluation completed;Education provided;Falls prevention discussed Falls evaluation completed -  Comment - - - - -    FALL RISK PREVENTION PERTAINING TO THE HOME:  Any stairs in or around the home? Yes  If so, are there any without handrails? No  Home free of loose throw rugs in walkways, pet beds, electrical cords, etc? Yes  Adequate lighting in your home to reduce risk of falls? Yes   ASSISTIVE DEVICES UTILIZED TO PREVENT FALLS:  Life alert? No  Use of a cane, walker or w/c? Yes  Grab bars in the bathroom? Yes  Shower chair or bench in shower? No  Elevated toilet seat or a handicapped toilet? No   TIMED UP AND GO:  Was the test performed? Yes .  Length of time to ambulate 10 feet: 10 sec.   Gait steady and fast without use of assistive device  Cognitive Function:     6CIT Screen 07/18/2021  What Year? 0 points  What month? 0 points  What time? 0 points  Count back from 20 0 points  Months in reverse 0 points  Repeat phrase 0 points  Total Score 0    Immunizations Immunization History  Administered Date(s) Administered   Influenza Split 08/19/2011   Influenza Whole 01/03/2013   Influenza,inj,Quad PF,6+ Mos 08/20/2013,  09/15/2014, 08/26/2015, 08/01/2016, 08/22/2017, 10/15/2018, 09/24/2019, 12/07/2020   Influenza-Unspecified 08/03/2016   Moderna Sars-Covid-2 Vaccination 03/11/2020, 04/08/2020, 08/03/2020   Pneumococcal Conjugate-13 02/24/2021   Pneumococcal Polysaccharide-23 08/19/2011, 12/12/2017   Tdap 06/28/2014    TDAP status: Up to date  Flu Vaccine status: Due, Education has been provided regarding the importance of this vaccine. Advised may receive this vaccine at local pharmacy or Health Dept. Aware to provide a copy of the vaccination record if obtained from local pharmacy or Health Dept. Verbalized acceptance and understanding.  Pneumococcal vaccine status: Up to date  Covid-19 vaccine status: Completed vaccines  Qualifies for Shingles Vaccine? Yes   Zostavax completed No   Shingrix Completed?: No.    Education has been provided regarding the importance of this vaccine. Patient has been advised to call insurance company to determine out of pocket expense if they have not yet received this vaccine. Advised may also receive vaccine at local pharmacy or Health Dept. Verbalized acceptance and understanding.  Screening Tests Health Maintenance  Topic Date Due   Zoster Vaccines- Shingrix (1 of 2) Never done   COVID-19 Vaccine (4 - Booster for Moderna series) 11/02/2020   INFLUENZA VACCINE  07/03/2021   PAP SMEAR-Modifier  08/21/2021   HEMOGLOBIN A1C  08/30/2021   FOOT EXAM  09/30/2021   OPHTHALMOLOGY EXAM  10/03/2021   URINE MICROALBUMIN  02/27/2022   MAMMOGRAM  12/09/2022   Pneumococcal Vaccine 9-21 Years old (4 - PPSV23 or PCV20) 03/17/2023   TETANUS/TDAP  06/28/2024   COLONOSCOPY (Pts 45-71yr Insurance coverage will need to be confirmed)  07/11/2031   PNEUMOCOCCAL POLYSACCHARIDE VACCINE AGE 55-64 HIGH RISK  Completed   Hepatitis C Screening  Completed   HIV Screening  Completed   HPV VACCINES  Aged Out    Health Maintenance  Health Maintenance Due  Topic Date Due   Zoster  Vaccines- Shingrix (1 of 2) Never done   COVID-19 Vaccine (4 - Booster for Moderna series) 11/02/2020   INFLUENZA VACCINE  07/03/2021   PAP SMEAR-Modifier  08/21/2021    Colorectal cancer screening: Type of screening: Colonoscopy. Completed 07/10/2021. Repeat every 10 years  Mammogram status: Completed 12/09/20. Repeat every year  Bone Density status: Completed 03/17/15. Results reflect: Bone density results: NORMAL. Repeat every 2 years.    Additional Screening:  Hepatitis C Screening: Completed 11/03/15  Vision Screening: Recommended annual ophthalmology exams for early detection of glaucoma and other disorders of the eye. Is the patient up to date with their annual eye exam?  Yes  Who is the provider or what is the name of the office in which the patient attends annual eye exams? Dr Katy Fitch If pt is not established with a provider, would they like to be referred to a provider to establish care? No .   Dental Screening: Recommended annual dental exams for proper oral hygiene  Community Resource Referral / Chronic Care Management: CRR required this visit?  Yes   CCM required this visit?  Yes      Plan:     I have personally reviewed and noted the following in the patient's chart:   Medical and social history Use of alcohol, tobacco or illicit drugs  Current medications and supplements including opioid prescriptions.  Functional ability and status Nutritional status Physical activity Advanced directives List of other physicians Hospitalizations, surgeries, and ER visits in previous 12 months Vitals Screenings to include cognitive, depression, and falls Referrals and appointments  In addition, I have reviewed and discussed with patient certain preventive protocols, quality metrics, and best practice recommendations. A written personalized care plan for preventive services as well as general preventive health recommendations were provided to patient.     Willette Brace,  LPN   [id number]   Nurse Notes: pt stated that she has been having loose stool and nausea since colonoscopy, pt stated she has reached out to Gastrology as well.

## 2021-07-20 ENCOUNTER — Telehealth: Payer: Self-pay | Admitting: *Deleted

## 2021-07-20 ENCOUNTER — Encounter: Payer: Self-pay | Admitting: *Deleted

## 2021-07-20 ENCOUNTER — Other Ambulatory Visit: Payer: Self-pay | Admitting: Family Medicine

## 2021-07-20 DIAGNOSIS — G894 Chronic pain syndrome: Secondary | ICD-10-CM | POA: Diagnosis not present

## 2021-07-20 DIAGNOSIS — G893 Neoplasm related pain (acute) (chronic): Secondary | ICD-10-CM | POA: Diagnosis not present

## 2021-07-20 DIAGNOSIS — M47816 Spondylosis without myelopathy or radiculopathy, lumbar region: Secondary | ICD-10-CM | POA: Diagnosis not present

## 2021-07-20 DIAGNOSIS — Z79891 Long term (current) use of opiate analgesic: Secondary | ICD-10-CM | POA: Diagnosis not present

## 2021-07-20 NOTE — Chronic Care Management (AMB) (Signed)
  Chronic Care Management   Outreach Note  07/20/2021 Name: Shelby Anderson MRN: [id number] DOB: [birth year]  [name] is a 65 y.o. year old female who is a primary care patient of Koberlein, Steele Berg, MD. I reached out to [employer] by phone today in response to a referral sent by Ms. [name]'s PCP, Caren Macadam, MD      An unsuccessful telephone outreach was attempted today. The patient was referred to the case management team for assistance with care management and care coordination.   Follow Up Plan: A HIPAA compliant phone message was left for the patient providing contact information and requesting a return call. The care management team will reach out to the patient again over the next 7 days.  If patient returns call to provider office, please advise to call [place] at [phone].  [facility]  Direct Dial: [phone]

## 2021-07-20 NOTE — Progress Notes (Signed)
Error

## 2021-07-21 ENCOUNTER — Ambulatory Visit: Payer: Medicare Other | Admitting: Orthopaedic Surgery

## 2021-07-21 NOTE — Chronic Care Management (AMB) (Signed)
  Chronic Care Management   Outreach Note  07/21/2021 Name: Shelby Anderson MRN: [id number] DOB: 1956-07-21  Shelby Anderson is a 65 y.o. year old female who is a primary care patient of Koberlein, Steele Berg, MD. I reached out to [employer] by phone today in response to a referral sent by Ms. [name]'s PCP, Caren Macadam, MD      A second unsuccessful telephone outreach was attempted today. The patient was referred to the case management team for assistance with care management and care coordination.   Follow Up Plan: A HIPAA compliant phone message was left for the patient providing contact information and requesting a return call. The care management team will reach out to the patient again over the next 5 days.  If patient returns call to provider office, please advise to call [place] at [phone].  [place] Management  Direct Dial: [phone]

## 2021-07-24 NOTE — Chronic Care Management (AMB) (Signed)
  Chronic Care Management   Outreach Note  07/24/2021 Name: Shelby Anderson MRN: [id number] DOB: 1956/01/10  Shelby Anderson is a 65 y.o. year old female who is a primary care patient of Koberlein, Steele Berg, MD. I reached out to [employer] by phone today in response to a referral sent by Shelby Anderson's PCP, Caren Macadam, MD      Third unsuccessful telephone outreach was attempted today. The patient was referred to the case management team for assistance with care management and care coordination. The patient's primary care provider has been notified of our unsuccessful attempts to make or maintain contact with the patient. The care management team is pleased to engage with this patient at any time in the future should he/she be interested in assistance from the care management team.   Follow Up Plan: We have been unable to make contact with the patient. The care management team is available to follow up with the patient after provider conversation with the patient regarding recommendation for care management engagement and subsequent re-referral to the care management team. A HIPAA compliant phone message was left for the patient providing contact information and requesting a return call.   [facility]  Direct Dial: [phone]

## 2021-07-27 ENCOUNTER — Ambulatory Visit (INDEPENDENT_AMBULATORY_CARE_PROVIDER_SITE_OTHER): Payer: Medicare Other | Admitting: Surgery

## 2021-07-27 ENCOUNTER — Encounter: Payer: Self-pay | Admitting: Surgery

## 2021-07-27 ENCOUNTER — Other Ambulatory Visit: Payer: Self-pay

## 2021-07-27 VITALS — BP 125/82 | HR 78

## 2021-07-27 DIAGNOSIS — M47812 Spondylosis without myelopathy or radiculopathy, cervical region: Secondary | ICD-10-CM

## 2021-07-27 NOTE — Progress Notes (Signed)
65 year old white female returns for recheck for her cervical spondylosis, left-sided neck pain, bilateral shoulder radiculopathy and occipital headaches.  When I tried to discuss patient's symptoms regarding her neck she adamantly told me that her neck is not a problem and she is having headaches because of her fall that occurred 4 months ago.  When she had this fall she was evaluated by her primary care physician who eventually went on to order a cervical spine MRI scan.  Patient was very argumentative and told me multiple times that there is nothing wrong with her neck and she is not really sure why she is being seen in our office after I told her that if she has an intracranial problem that this is not something that is addressed by [place] and should be discussed with her primary care physician and/or neurology.  She stated that this was a waste of time.  Patient did pay a co-pay which was returned to her.  No charge patient for today's visit and advised her that I recommend that she contact her primary care physician to discuss whether or not she needs further imaging of her brain with a CT or MRI due to her ongoing headaches that she feels is related to her head injury that occurred a few months ago.  I will schedule her a follow-up visit with Dr. Lorin Mercy in 3 weeks for recheck if she desires.

## 2021-07-28 ENCOUNTER — Other Ambulatory Visit: Payer: Self-pay | Admitting: Family Medicine

## 2021-07-28 MED ORDER — NITROGLYCERIN 0.4 MG SL SUBL: 0.4000 mg | SUBLINGUAL_TABLET | SUBLINGUAL | 2 refills | Status: DC | PRN

## 2021-08-01 ENCOUNTER — Telehealth: Payer: Self-pay | Admitting: *Deleted

## 2021-08-01 NOTE — Telephone Encounter (Signed)
Left a message for the patient to return my call.  

## 2021-08-01 NOTE — Telephone Encounter (Signed)
-----   Message from Caren Macadam, MD sent at 07/31/2021 11:04 PM EDT ----- I was Cc'd on ortho visit and looked like she was upset feeling that headaches were not being addressed (I had sent her there for significantly abnormal cervical imaging and was concerned that headaches may be related to findings on cervical imaging). She has seen neurology (Dr. Posey Pronto) in the past and can certainly call her for follow up if she is strongly feeling that headaches are not related to neck. I'm sure Dr. Posey Pronto will review recent imaging as well as part of that visit. Please let me know if any questions/concerns.    ----- Message ----- From: Lanae Crumbly, PA-C Sent: 07/27/2021   4:14 PM EDT To: Caren Macadam, MD

## 2021-08-08 ENCOUNTER — Other Ambulatory Visit: Payer: Self-pay | Admitting: Pulmonary Disease

## 2021-08-08 ENCOUNTER — Other Ambulatory Visit: Payer: Self-pay | Admitting: Family Medicine

## 2021-08-08 ENCOUNTER — Telehealth: Payer: Self-pay | Admitting: *Deleted

## 2021-08-08 NOTE — Telephone Encounter (Signed)
Spoke with the patient and informed her of the message below.  Patient stated she was seen twice by the orthopedic specialist , was told they do not treat headaches and only spine problems.  Patient stated she has Dr Serita Grit number and agreed to call for an appt.

## 2021-08-10 ENCOUNTER — Encounter: Payer: Self-pay | Admitting: Adult Health

## 2021-08-10 ENCOUNTER — Ambulatory Visit: Payer: Medicare Other | Admitting: Adult Health

## 2021-08-10 ENCOUNTER — Other Ambulatory Visit: Payer: Self-pay

## 2021-08-10 DIAGNOSIS — J302 Other seasonal allergic rhinitis: Secondary | ICD-10-CM | POA: Diagnosis not present

## 2021-08-10 DIAGNOSIS — J4530 Mild persistent asthma, uncomplicated: Secondary | ICD-10-CM

## 2021-08-10 DIAGNOSIS — G4733 Obstructive sleep apnea (adult) (pediatric): Secondary | ICD-10-CM | POA: Diagnosis not present

## 2021-08-10 NOTE — Assessment & Plan Note (Signed)
Continue on current regimen .   

## 2021-08-10 NOTE — Assessment & Plan Note (Signed)
Excellent control and compliance on CPAP   Plan  . Patient Instructions  Continue on Symbicort 2 puffs Twice daily  , rinse after use.  Continue on Singulair daily  Albuterol As needed   Low salt diet  Continue on CPAP At bedtime   Work on healthy diet and weight loss.  Follow up with Dr. Elsworth Soho  In 6 months and As needed   Please contact office for sooner follow up if symptoms do not improve or worsen or seek emergency care
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
## 2021-08-10 NOTE — Assessment & Plan Note (Signed)
Good control   Plan  Patient Instructions  Continue on Symbicort 2 puffs Twice daily  , rinse after use.  Continue on Singulair daily  Albuterol As needed   Low salt diet  Continue on CPAP At bedtime   Work on healthy diet and weight loss.  Flu shot and covid booster this fall.  Follow up with Dr. Elsworth Soho  In 6 months and As needed   Please contact office for sooner follow up if symptoms do not improve or worsen or seek emergency care

## 2021-08-10 NOTE — Patient Instructions (Addendum)
Continue on Symbicort 2 puffs Twice daily  , rinse after use.  Continue on Singulair daily  Albuterol As needed   Low salt diet  Continue on CPAP At bedtime   Work on healthy diet and weight loss.  Flu shot and covid booster this fall.  Follow up with Dr. Elsworth Soho  In 6 months and As needed   Please contact office for sooner follow up if symptoms do not improve or worsen or seek emergency care

## 2021-08-17 DIAGNOSIS — G893 Neoplasm related pain (acute) (chronic): Secondary | ICD-10-CM | POA: Diagnosis not present

## 2021-08-17 DIAGNOSIS — Z79891 Long term (current) use of opiate analgesic: Secondary | ICD-10-CM | POA: Diagnosis not present

## 2021-08-17 DIAGNOSIS — M47816 Spondylosis without myelopathy or radiculopathy, lumbar region: Secondary | ICD-10-CM | POA: Diagnosis not present

## 2021-08-17 DIAGNOSIS — G894 Chronic pain syndrome: Secondary | ICD-10-CM | POA: Diagnosis not present

## 2021-08-18 ENCOUNTER — Other Ambulatory Visit: Payer: Self-pay | Admitting: Student

## 2021-08-18 ENCOUNTER — Other Ambulatory Visit: Payer: Self-pay | Admitting: Family Medicine

## 2021-08-25 ENCOUNTER — Encounter: Payer: Self-pay | Admitting: Podiatry

## 2021-08-25 ENCOUNTER — Ambulatory Visit (INDEPENDENT_AMBULATORY_CARE_PROVIDER_SITE_OTHER): Payer: Medicare Other | Admitting: Podiatry

## 2021-08-25 ENCOUNTER — Other Ambulatory Visit: Payer: Self-pay

## 2021-08-25 DIAGNOSIS — E1151 Type 2 diabetes mellitus with diabetic peripheral angiopathy without gangrene: Secondary | ICD-10-CM | POA: Diagnosis not present

## 2021-08-25 DIAGNOSIS — L600 Ingrowing nail: Secondary | ICD-10-CM | POA: Diagnosis not present

## 2021-08-25 DIAGNOSIS — B351 Tinea unguium: Secondary | ICD-10-CM | POA: Diagnosis not present

## 2021-08-29 NOTE — Progress Notes (Signed)
Subjective:  Patient ID: Shelby Anderson, female    DOB: 07-01-1956,  MRN: [id number]  Shelby Anderson presents to clinic today for at risk foot care. Pt has h/o NIDDM with PAD and painful thick toenails that are difficult to trim. Pain interferes with ambulation. Aggravating factors include wearing enclosed shoe gear. Pain is relieved with periodic professional  debridement.  Blood glucose was 126 mg/dl today.  She feels the 2nd and 3rd digits on her right foot are turning down.  PCP is Dr. [name] and last visit was 02/24/2021  Allergies  Allergen Reactions   Lodine [Etodolac] Anaphylaxis, Hives and Swelling   Oxycontin [Oxycodone Hcl] Anaphylaxis    hives, trouble breathing, tongue swelling (Only Oxycontin) Tolerates plain oxycodone.   Penicillins Anaphylaxis    Told by a surgeon never to take it again. Has patient had a PCN reaction causing immediate rash, facial/tongue/throat swelling, SOB or lightheadedness with hypotension: Yes Has patient had a PCN reaction causing severe rash involving mucus membranes or skin necrosis: Unknown Has patient had a PCN reaction that required hospitalization: No Has patient had a PCN reaction occurring within the last 10 years: No If all of the above answers are "NO", then may proceed with Cephalosporin use.   Aspirin Other (See Comments)    High-dose caused GI Bleeds   Darvocet [Propoxyphene N-Acetaminophen] Hives   Nitroglycerin Other (See Comments)    IV-BP drops dramatically Can take po   Tramadol Hives and Itching   Ultram [Tramadol Hcl] Hives   Valium Other (See Comments)    Circulation problems. "Legs turned black".    Review of Systems: Negative except as noted in the HPI. Objective:   Constitutional Shelby Anderson is a pleasant 65 y.o. Caucasian female, obese in NAD. AAO x 3.   Vascular Capillary refill time to digits immediate b/l. Faintly palpable DP pulse(s) b/l lower extremities. Nonpalpable PT pulse(s) b/l lower extremities. Pedal hair absent. Lower extremity skin temperature gradient within normal limits. No cyanosis or clubbing noted.  Neurologic Normal speech. Oriented to person, place, and time. Protective sensation intact 5/5 intact bilaterally with 10g monofilament b/l.  Dermatologic Toenails 1-5 b/l elongated, discolored, dystrophic, thickened, crumbly with  subungual debris and tenderness to dorsal palpation. Incurvated nailplate b/l border(s) L hallux and R hallux.  Nail border hypertrophy minimal. There is tenderness to palpation. Sign(s) of infection: no clinical signs of infection noted on examination today..  Orthopedic: Normal muscle strength 5/5 to all lower extremity muscle groups bilaterally. No pain crepitus or joint limitation noted with ROM b/l. No gross bony deformities bilaterally.   Radiographs: None Assessment:   1. Onychomycosis   2. Ingrown toenail without infection   3. Diabetes mellitus type 2 with peripheral artery disease ([place])    Plan:  -Continue diabetic foot care principles: inspect feet daily, monitor glucose as recommended by PCP and/or Endocrinologist, and follow prescribed diet per PCP, Endocrinologist and/or dietician. -Patient to continue soft, supportive shoe gear daily. -Toenails 1-5 b/l were debrided in length and girth with sterile nail nippers and dremel without iatrogenic bleeding.  -Offending nail border debrided and curretaged L hallux and R hallux utilizing sterile nail nipper and currette. Border cleansed with alcohol and triple antibiotic applied. No further treatment required by patient/caregiver. We discussed matrixectomy for left great toe due to chronicity of her ingrown toenail. We will schedule this to be done with Dr. March Rummage in November. If she has any problems before November, call office for sooner appointment. -Patient to  report any pedal injuries to medical professional immediately. -Patient/POA to call should there be question/concern in the interim.  Return in about 3 months (around 11/24/2021).  Marzetta Board, DPM

## 2021-09-01 ENCOUNTER — Ambulatory Visit: Payer: Medicare Other | Admitting: Family Medicine

## 2021-09-12 ENCOUNTER — Encounter: Payer: Self-pay | Admitting: Student

## 2021-09-12 ENCOUNTER — Ambulatory Visit: Payer: Medicare Other | Admitting: Student

## 2021-09-12 ENCOUNTER — Other Ambulatory Visit: Payer: Self-pay

## 2021-09-12 VITALS — BP 108/78 | HR 79 | Temp 98.2°F | Ht 66.0 in | Wt 212.0 lb

## 2021-09-12 DIAGNOSIS — I951 Orthostatic hypotension: Secondary | ICD-10-CM | POA: Diagnosis not present

## 2021-09-12 DIAGNOSIS — I10 Essential (primary) hypertension: Secondary | ICD-10-CM

## 2021-09-12 DIAGNOSIS — R072 Precordial pain: Secondary | ICD-10-CM

## 2021-09-12 MED ORDER — HYDRALAZINE HCL 25 MG PO TABS: 25.0000 mg | ORAL_TABLET | Freq: Every day | ORAL | 3 refills | Status: DC | PRN

## 2021-09-12 NOTE — Progress Notes (Signed)
Primary Physician/Referring:  Caren Macadam, MD  Patient ID: Shelby Anderson, female    DOB: 1956-11-22, 65 y.o.   MRN: [id number]  Chief Complaint  Patient presents with   Hypertension   HPI:    Shelby Anderson  is a 65 y.o. obese Caucasian female with history of NSTEMI with normal coronary arteries, diabetes mellitus, orthostatic hypotension, chronic chest pain syndrome, recurrent syncope status post loop recorder implantation by [facility] cardiology.  She also has history of chronic severe back pain, bronchial asthma and severe restrictive lung disease, severe GERD, distal esophageal stricture status post dilation, peptic ulcer disease, depression.  Patient is a retired [employer] scientist.  Echocardiogram and February 2018 revealed RV dilation.  Patient presents for 63-monthfollow-up of hypertension and orthostatic hypotension.  At last office visit patient was stable and no changes were made.  Patient does continue to have chronic chest pain which is stable with episodes occurring both at rest and with exertion.  She also reports occasional episodes approximately 1-2 times per week where her blood pressure is elevated in the 1510C-585ImmHg systolic and she experiences headaches.  However typically her blood pressure is soft.  Patient denies palpitations, dyspnea, syncope, near-syncope, PND, orthopnea, leg edema.   Past Medical History:  Diagnosis Date   Allergy    Anemia    "chronic"   Angina    Prinzmetal angina   Anxiety and depression    Arthritis    Asthma    Atrial fibrillation ([place]    h/o "AF w/frequent PVCs"   Breast cancer ([place]    Left   Cancer (HCC)    hx of skin cancer    CHF (congestive heart failure) (HCC)    Chronic back pain greater than 3 months duration    on chronic narcotics, treated at pain clinic   Chronic respiratory failure ([place] 09/15/2015   ONO 09/04/15 + desats >begin O2 at 2l/ m    Colon polyps    hyperplastic   Coronary artery disease     Arrythmia, orthostatic hypotension, HLD, HTN; sees Dr. GEinar Gip  Difficult intubation    "TMJ & woke up when they were still cutting on me"   Dysrhythmia    [facility]   Esophageal stricture    Family history of melanoma ]   Family history of pancreatic cancer    Fatty liver    Fatty liver    Fibroids    Fibromyalgia    "in my legs"   GERD (gastroesophageal reflux disease)    hx hiatal hernia, stricture and gastric ulcer   Headache(784.0)    migraines   Heart murmur    Hiatal hernia    History of loop recorder    History of migraines    "dx'd when I was in my teens"   Hyperlipemia    Hypertension    Mental disorder    Mild episode of recurrent major depressive disorder ([place] 12/06/2015   Myocardial infarction ([facility] 1980's & 1990;   sees Dr. GEinar Gip  OSA (obstructive sleep apnea)    Personal history of chemotherapy    Personal history of radiation therapy    Pneumonia    multiple times   PONV (postoperative nausea and vomiting)    Recurrent upper respiratory infection (URI)    Shortness of breath 11/20/11   "all the time", sees pulmonlogy, ? asthma   Stenotic cervical os    Stomach ulcer    "  3 small; found in 05/2011"   TMJ (dislocation of temporomandibular joint)    Tuberculosis    + TB SKIN TEST   Type 2 diabetes mellitus without complication, without long-term current use of insulin ([place]) 12/06/2015   not on meds    Past Surgical History:  Procedure Laterality Date   ACHILLES TENDON REPAIR  1970's   left ankle   ARTHROSCOPIC REPAIR ACL     left knee cap   BREAST BIOPSY Right 04/08/2013   again in October or November 2020   BREAST LUMPECTOMY WITH RADIOACTIVE SEED AND SENTINEL LYMPH NODE BIOPSY Left 12/09/2019   Procedure: LEFT BREAST LUMPECTOMY WITH RADIOACTIVE SEED AND LEFT AXILLARY SENTINEL LYMPH NODE BIOPSY;  Surgeon: Rolm Bookbinder, MD;  Location: [place];  Service: General;  Laterality: Left;  PEC BLOCK   CARDIAC  CATHETERIZATION     loop recorder   CARPAL TUNNEL RELEASE  unknown   left hand   COLONOSCOPY     ESOPHAGOGASTRODUODENOSCOPY (EGD) WITH PROPOFOL N/A 10/29/2017   Procedure: ESOPHAGOGASTRODUODENOSCOPY (EGD) WITH PROPOFOL;  Surgeon: Irene Shipper, MD;  Location: [facility];  Service: Endoscopy;  Laterality: N/A;   LOOP RECORDER IMPLANT     PORT-A-CATH REMOVAL N/A 12/09/2019   Procedure: REMOVAL PORT-A-CATH;  Surgeon: Rolm Bookbinder, MD;  Location: [place];  Service: General;  Laterality: N/A;   PORTACATH PLACEMENT N/A 09/23/2019   Procedure: INSERTION PORT-A-CATH Right Internal Doreen Salvage;  Surgeon: Rolm Bookbinder, MD;  [facility];  Service: General;  Laterality: N/A;   post ganglionectomy  1970's   "for migraine headaches"   pouch string  36,74,25   "did this 3 times (once w/each pregnancy)"   SAVORY DILATION N/A 10/29/2017   Procedure: SAVORY DILATION;  Surgeon: Irene Shipper, MD;  Location: [facility];  Service: Endoscopy;  Laterality: N/A;   TOTAL KNEE ARTHROPLASTY Left 09/25/2016   Procedure: LEFT TOTAL KNEE ARTHROPLASTY;  Surgeon: Paralee Cancel, MD;  Location: [facility];  Service: Orthopedics;  Laterality: Left;   TUBAL LIGATION  43's   Family History  Problem Relation Age of Onset   Malignant hyperthermia Father    Hypertension Father    Heart disease Father    Diabetes Father    Cancer Father        skin   Hypertension Mother    Heart disease Mother    Multiple myeloma Mother    Cancer Sister        CERVICAL   Hypertension Sister    Cancer Brother 82       MELANOMA   Heart disease Maternal Grandmother    Other Maternal Grandmother 32       complications of childbirth   Heart disease Maternal Grandfather    Cancer Paternal Grandmother        ?    Heart disease Paternal Grandmother    Heart disease Paternal Grandfather    Cancer Brother        LUNG   Diabetes Sister    Hypertension Sister    Heart disease Sister    Cancer Sister    Cancer  Brother    Pancreatic cancer Niece 58   Cancer Nephew 52       unknown- currently in the [employer]   Anesthesia problems Neg Hx    Hypotension Neg Hx    Pseudochol deficiency Neg Hx    Colon cancer Neg Hx    Esophageal cancer Neg Hx    Rectal cancer Neg Hx    Stomach  cancer Neg Hx    Breast cancer Neg Hx     Social History   Tobacco Use   Smoking status: Never   Smokeless tobacco: Never  Substance Use Topics   Alcohol use: No    Alcohol/week: 0.0 standard drinks   Marital Status: Married   ROS  Review of Systems  Constitutional: Negative for malaise/fatigue and weight gain.  Cardiovascular:  Positive for chest pain (stable, chronic). Negative for claudication, leg swelling, near-syncope, orthopnea, palpitations, paroxysmal nocturnal dyspnea and syncope.  Respiratory:  Negative for shortness of breath.   Hematologic/Lymphatic: Does not bruise/bleed easily.  Gastrointestinal:  Negative for melena.  Neurological:  Negative for weakness. Dizziness: improved.  Objective  Blood pressure 108/78, pulse 79, temperature 98.2 F (36.8 C), height '5\' 6"'  (1.676 m), weight 212 lb (96.2 kg), SpO2 95 %.  Vitals with BMI 09/12/2021 08/10/2021 07/27/2021  Height '5\' 6"'  '5\' 6"'  -  Weight 212 lbs 215 lbs 3 oz -  BMI 76.54 65.03 -  Systolic 546 568 127  Diastolic 78 82 82  Pulse 79 77 78    Orthostatic VS for the past 72 hrs (Last 3 readings):  Patient Position BP Location Cuff Size  09/12/21 1257 Sitting Left Arm Large      Physical Exam Vitals reviewed.  Constitutional:      Appearance: She is obese.  HENT:     Head: Normocephalic and atraumatic.  Cardiovascular:     Rate and Rhythm: Normal rate and regular rhythm.     Pulses: Intact distal pulses.          Carotid pulses are 2+ on the right side and 2+ on the left side.      Radial pulses are 2+ on the right side and 2+ on the left side.       Dorsalis pedis pulses are 2+ on the right side and 2+ on the left side.       Posterior  tibial pulses are 1+ on the right side and 1+ on the left side.     Heart sounds: S1 normal and S2 normal. No murmur heard.   No gallop.     Comments: Femoral and popliteal pulses difficult to evaluate due to patient body habitus. Pulmonary:     Effort: Pulmonary effort is normal. No respiratory distress.     Breath sounds: No wheezing, rhonchi or rales.  Musculoskeletal:     Right lower leg: No edema.     Left lower leg: Edema (trace) present.  Skin:    General: Skin is warm and dry.     Capillary Refill: Capillary refill takes less than 2 seconds.  Neurological:     Mental Status: She is alert.    Laboratory examination:   Recent Labs    09/30/20 1533 02/27/21 1300  NA 142 142  K 4.2 4.1  CL 101 101  CO2 29 34*  GLUCOSE 112* 89  BUN 12 12  CREATININE 0.67 0.65  CALCIUM 9.8 9.0   CrCl cannot be calculated (Patient's most recent lab result is older than the maximum 21 days allowed.).  CMP Latest Ref Rng & Units 02/27/2021 09/30/2020 12/08/2019  Glucose 70 - 99 mg/dL 89 112(H) 150(H)  BUN 6 - 23 mg/dL 12 12 6(L)  Creatinine 0.40 - 1.20 mg/dL 0.65 0.67 0.60  Sodium 135 - 145 mEq/L 142 142 143  Potassium 3.5 - 5.1 mEq/L 4.1 4.2 3.7  Chloride 96 - 112 mEq/L 101 101 102  CO2 19 -  32 mEq/L 34(H) 29 27  Calcium 8.4 - 10.5 mg/dL 9.0 9.8 9.2  Total Protein 6.0 - 8.3 g/dL 6.5 7.5 6.6  Total Bilirubin 0.2 - 1.2 mg/dL 0.4 0.5 0.3  Alkaline Phos 39 - 117 U/L 68 - 63  AST 0 - 37 U/L '18 26 28  ' ALT 0 - 35 U/L '18 25 29   ' CBC Latest Ref Rng & Units 02/27/2021 09/30/2020 12/08/2019  WBC 4.0 - 10.5 K/uL 5.5 5.5 5.7  Hemoglobin 12.0 - 15.0 g/dL 12.6 13.7 12.5  Hematocrit 36.0 - 46.0 % 38.1 42.8 40.9  Platelets 150.0 - 400.0 K/uL 222.0 271 265    Lipid Panel Recent Labs    09/30/20 1533 02/27/21 1300  CHOL 141 119  TRIG 226* 183.0*  LDLCALC 73 51  VLDL  --  36.6  HDL 37* 31.00*  CHOLHDL 3.8 4    HEMOGLOBIN A1C Lab Results  Component Value Date   HGBA1C 7.4 (H) 02/27/2021    MPG 160 09/30/2020   TSH Recent Labs    09/30/20 1533 12/07/20 1433  TSH 6.50* 2.99    External labs:   None   Allergies   Allergies  Allergen Reactions   Lodine [Etodolac] Anaphylaxis, Hives and Swelling   Oxycontin [Oxycodone Hcl] Anaphylaxis    hives, trouble breathing, tongue swelling (Only Oxycontin) Tolerates plain oxycodone.   Penicillins Anaphylaxis    Told by a surgeon never to take it again. Has patient had a PCN reaction causing immediate rash, facial/tongue/throat swelling, SOB or lightheadedness with hypotension: Yes Has patient had a PCN reaction causing severe rash involving mucus membranes or skin necrosis: Unknown Has patient had a PCN reaction that required hospitalization: No Has patient had a PCN reaction occurring within the last 10 years: No If all of the above answers are "NO", then may proceed with Cephalosporin use.   Aspirin Other (See Comments)    High-dose caused GI Bleeds   Darvocet [Propoxyphene N-Acetaminophen] Hives   Nitroglycerin Other (See Comments)    IV-BP drops dramatically Can take po   Tramadol Hives and Itching   Ultram [Tramadol Hcl] Hives   Valium Other (See Comments)    Circulation problems. "Legs turned black".      Medications Prior to Visit:   Outpatient Medications Prior to Visit  Medication Sig Dispense Refill   albuterol (PROVENTIL) (2.5 MG/3ML) 0.083% nebulizer solution Take 3 mLs (2.5 mg total) by nebulization every 6 (six) hours as needed for wheezing or shortness of breath (J45.40). 75 mL 3   albuterol (VENTOLIN HFA) 108 (90 Base) MCG/ACT inhaler Inhale 2 puffs into the lungs every 6 (six) hours as needed for wheezing or shortness of breath. 3 g 3   aspirin EC 81 MG tablet Take 81 mg at bedtime by mouth.      atorvastatin (LIPITOR) 40 MG tablet TAKE 1 TABLET BY MOUTH EVERYDAY AT BEDTIME. **DUE FOR YEARLY PHYSICAL** 30 tablet 5   Blood Glucose Monitoring Suppl (ONE TOUCH ULTRA 2) w/Device KIT USE TO CHECK BLOOD  SUGAR 2 TIMES A DAY 1 kit 0   budesonide-formoterol (SYMBICORT) 80-4.5 MCG/ACT inhaler INHALE 2 PUFFS INTO THE LUNGS EVERY MORNING AND ANOTHER 2 PUFFS 12 HOURS LATER 30.6 Inhaler 1   buPROPion (WELLBUTRIN XL) 150 MG 24 hr tablet TAKE 1 TABLET BY MOUTH EVERY DAY 90 tablet 1   Calcium Carbonate-Vitamin D 600-200 MG-UNIT TABS Take 1 tablet by mouth daily.     CINNAMON PO Take 1,000 mg 2 (two) times daily by  mouth.     doxepin (SINEQUAN) 10 MG capsule Take 30 mg by mouth at bedtime.     esomeprazole (NEXIUM) 40 MG capsule Take 1 capsule (40 mg total) by mouth 2 (two) times daily. 180 capsule 1   famotidine (PEPCID) 20 MG tablet Take 1 tablet (20 mg total) by mouth 2 (two) times daily as needed for heartburn or indigestion. 180 tablet 1   fluticasone (FLONASE) 50 MCG/ACT nasal spray Place 2 sprays at bedtime as needed into both nostrils for allergies or rhinitis.      furosemide (LASIX) 20 MG tablet Take 1 tablet (20 mg total) by mouth daily as needed for fluid. 90 tablet 3   glipiZIDE (GLUCOTROL XL) 5 MG 24 hr tablet TAKE 1 TABLET BY MOUTH EVERYDAY WITH BREAKFAST. **DUE FOR YEARLY PHYSICAL** 90 tablet 1   L-Methylfolate (DEPLIN) 7.5 MG TABS Take 7.5 mg by mouth daily with breakfast.     Lancets (ONETOUCH DELICA PLUS [id number]) MISC USE TO CHECK BLOOD SUGAR 2 TIMES A DAY 100 each 3   levothyroxine (SYNTHROID) 50 MCG tablet TAKE 1 TABLET BY MOUTH EVERY DAY 90 tablet 0   LYRICA 150 MG capsule Take 150 mg by mouth 3 (three) times daily.   2   metFORMIN (GLUCOPHAGE) 500 MG tablet TAKE 1 TABLET BY MOUTH TWICE A DAY WITH MEALS 180 tablet 1   metoprolol succinate (TOPROL-XL) 25 MG 24 hr tablet TAKE 1 TABLET BY MOUTH DAILY. TAKE WITH OR IMMEDIATELY FOLLOWING A MEAL. 90 tablet 3   Misc Natural Products (GLUCOS-CHONDROIT-MSM COMPLEX PO) Take 2 tablets by mouth daily.     mometasone (ELOCON) 0.1 % cream Apply topically.     montelukast (SINGULAIR) 10 MG tablet TAKE 1 TABLET BY MOUTH EVERYDAY AT BEDTIME 90 tablet  1   Multiple Vitamin (MULITIVITAMIN WITH MINERALS) TABS Take 1 tablet by mouth daily.     nitroGLYCERIN (NITROSTAT) 0.4 MG SL tablet Place 1 tablet (0.4 mg total) under the tongue every 5 (five) minutes as needed for chest pain. 10 tablet 2   ondansetron (ZOFRAN) 4 MG tablet Take 1 tablet (4 mg total) by mouth every 8 (eight) hours as needed for nausea or vomiting. 80 tablet 5   ONETOUCH ULTRA test strip USE TO CHECK BLOOD SUGAR 2 TIMES A DAY 100 strip 3   oxyCODONE (ROXICODONE) 15 MG immediate release tablet Take 15 mg by mouth 5 (five) times daily.     potassium chloride (KLOR-CON) 10 MEQ tablet TAKE 2 TABLETS BY MOUTH EVERY DAY 180 tablet 1   promethazine (PHENERGAN) 25 MG tablet Take 1 tablet (25 mg total) by mouth at bedtime. 90 tablet 1   Spacer/Aero-Holding Chambers DEVI 1 Device by Does not apply route daily as needed. 1 Device 0   sucralfate (CARAFATE) 1 g tablet DISSOLVE 1 TAB IN 1 TABLESPOON OF DISTILLED WATER FOR 15 MINS. TAKE 4XDAILY WITH MEALS & AT BEDTIME 360 tablet 3   tiZANidine (ZANAFLEX) 4 MG tablet Take 4 mg by mouth every 8 (eight) hours as needed for muscle spasms.     vitamin B-12 (CYANOCOBALAMIN) 500 MCG tablet Take 500 mcg by mouth daily.      vitamin E 200 UNIT capsule Take 200 Units by mouth daily.     mupirocin cream (BACTROBAN) 2 % Apply once daily to lesions on feet as needed. 30 g 1   No facility-administered medications prior to visit.   Final Medications at End of Visit    Current Meds  Medication Sig  albuterol (PROVENTIL) (2.5 MG/3ML) 0.083% nebulizer solution Take 3 mLs (2.5 mg total) by nebulization every 6 (six) hours as needed for wheezing or shortness of breath (J45.40).   albuterol (VENTOLIN HFA) 108 (90 Base) MCG/ACT inhaler Inhale 2 puffs into the lungs every 6 (six) hours as needed for wheezing or shortness of breath.   aspirin EC 81 MG tablet Take 81 mg at bedtime by mouth.    atorvastatin (LIPITOR) 40 MG tablet TAKE 1 TABLET BY MOUTH EVERYDAY AT  BEDTIME. **DUE FOR YEARLY PHYSICAL**   Blood Glucose Monitoring Suppl (ONE TOUCH ULTRA 2) w/Device KIT USE TO CHECK BLOOD SUGAR 2 TIMES A DAY   budesonide-formoterol (SYMBICORT) 80-4.5 MCG/ACT inhaler INHALE 2 PUFFS INTO THE LUNGS EVERY MORNING AND ANOTHER 2 PUFFS 12 HOURS LATER   buPROPion (WELLBUTRIN XL) 150 MG 24 hr tablet TAKE 1 TABLET BY MOUTH EVERY DAY   Calcium Carbonate-Vitamin D 600-200 MG-UNIT TABS Take 1 tablet by mouth daily.   CINNAMON PO Take 1,000 mg 2 (two) times daily by mouth.   doxepin (SINEQUAN) 10 MG capsule Take 30 mg by mouth at bedtime.   esomeprazole (NEXIUM) 40 MG capsule Take 1 capsule (40 mg total) by mouth 2 (two) times daily.   famotidine (PEPCID) 20 MG tablet Take 1 tablet (20 mg total) by mouth 2 (two) times daily as needed for heartburn or indigestion.   fluticasone (FLONASE) 50 MCG/ACT nasal spray Place 2 sprays at bedtime as needed into both nostrils for allergies or rhinitis.    furosemide (LASIX) 20 MG tablet Take 1 tablet (20 mg total) by mouth daily as needed for fluid.   glipiZIDE (GLUCOTROL XL) 5 MG 24 hr tablet TAKE 1 TABLET BY MOUTH EVERYDAY WITH BREAKFAST. **DUE FOR YEARLY PHYSICAL**   hydrALAZINE (APRESOLINE) 25 MG tablet Take 1 tablet (25 mg total) by mouth daily as needed (for BP >140/90 mmHg, up to three times daily).   L-Methylfolate (DEPLIN) 7.5 MG TABS Take 7.5 mg by mouth daily with breakfast.   Lancets (ONETOUCH DELICA PLUS [id number]) MISC USE TO CHECK BLOOD SUGAR 2 TIMES A DAY   levothyroxine (SYNTHROID) 50 MCG tablet TAKE 1 TABLET BY MOUTH EVERY DAY   LYRICA 150 MG capsule Take 150 mg by mouth 3 (three) times daily.    metFORMIN (GLUCOPHAGE) 500 MG tablet TAKE 1 TABLET BY MOUTH TWICE A DAY WITH MEALS   metoprolol succinate (TOPROL-XL) 25 MG 24 hr tablet TAKE 1 TABLET BY MOUTH DAILY. TAKE WITH OR IMMEDIATELY FOLLOWING A MEAL.   Misc Natural Products (GLUCOS-CHONDROIT-MSM COMPLEX PO) Take 2 tablets by mouth daily.   mometasone (ELOCON) 0.1 %  cream Apply topically.   montelukast (SINGULAIR) 10 MG tablet TAKE 1 TABLET BY MOUTH EVERYDAY AT BEDTIME   Multiple Vitamin (MULITIVITAMIN WITH MINERALS) TABS Take 1 tablet by mouth daily.   nitroGLYCERIN (NITROSTAT) 0.4 MG SL tablet Place 1 tablet (0.4 mg total) under the tongue every 5 (five) minutes as needed for chest pain.   ondansetron (ZOFRAN) 4 MG tablet Take 1 tablet (4 mg total) by mouth every 8 (eight) hours as needed for nausea or vomiting.   ONETOUCH ULTRA test strip USE TO CHECK BLOOD SUGAR 2 TIMES A DAY   oxyCODONE (ROXICODONE) 15 MG immediate release tablet Take 15 mg by mouth 5 (five) times daily.   potassium chloride (KLOR-CON) 10 MEQ tablet TAKE 2 TABLETS BY MOUTH EVERY DAY   promethazine (PHENERGAN) 25 MG tablet Take 1 tablet (25 mg total) by mouth at bedtime.  Spacer/Aero-Holding Chambers DEVI 1 Device by Does not apply route daily as needed.   sucralfate (CARAFATE) 1 g tablet DISSOLVE 1 TAB IN 1 TABLESPOON OF DISTILLED WATER FOR 15 MINS. TAKE 4XDAILY WITH MEALS & AT BEDTIME   tiZANidine (ZANAFLEX) 4 MG tablet Take 4 mg by mouth every 8 (eight) hours as needed for muscle spasms.   vitamin B-12 (CYANOCOBALAMIN) 500 MCG tablet Take 500 mcg by mouth daily.    vitamin E 200 UNIT capsule Take 200 Units by mouth daily.   Radiology:   No results found.  Cardiac Studies:  Echocardiogram 10/01/2019: 1. Left ventricular ejection fraction, by visual estimation, is 60 to 65%. The left ventricle has normal function. There is no left ventricular hypertrophy.   2. The left ventricle has no regional wall motion abnormalities.   3. Global right ventricle has normal systolic function.The right ventricular size is mildly enlarged. No increase in right ventricular wall thickness.   4. Left atrial size was normal.   5. Right atrial size was normal.   6. Presence of pericardial fat pad.   7. Mild to moderate mitral annular calcification.   8. The mitral valve is degenerative. No evidence  of mitral valve  regurgitation. No evidence of mitral stenosis.   9. The tricuspid valve is grossly normal. Tricuspid valve regurgitation  is trivial.  10. The aortic valve is tricuspid. Aortic valve regurgitation is not  visualized. No evidence of aortic valve sclerosis or stenosis.  11. There is Mild calcification of the aortic valve.  12. There is Mild thickening of the aortic valve.  13. The pulmonic valve was grossly normal. Pulmonic valve regurgitation is  not visualized.  14. Mild plaque invoving the ascending aorta.  15. Normal pulmonary artery systolic pressure.  16. The tricuspid regurgitant velocity is 1.94 m/s, and with an assumed  right atrial pressure of 15 mmHg, the estimated right ventricular systolic  pressure is normal at 30.1 mmHg.  17. The inferior vena cava is dilated in size with <50% respiratory  variability, suggesting right atrial pressure of 15 mmHg.   Echocardiogram 07/16/2017: Left ventricle cavity is normal in size.  Normal global wall motion.  Calculated EF 62%. Moderate biatrial enlargement Moderate RV enlargement with preserved systolic function No significant change compared to prior study 01/2017.  Assessment of pulmonary systolic pressure is again limited due to inadequate TR jet visualization  Heart catheterization 04/07/2010: Normal coronary arteries, LVEF 65%  EKG:  09/12/2021: Sinus rhythm at a rate of 74 bpm.  Normal axis.  Poor R wave progression, cannot exclude anteroseptal infarct old.  Cannot exclude inferior infarct old.  Nonspecific T wave abnormality.  EKG 01/22/2021: Sinus rhythm at a rate of 86 bpm.  Left axis, left anterior fascicular block.  Poor progression, cannot exclude anteroseptal infarct old.  Nonspecific T wave abnormality.  Low voltage complexes, consider pulmonary disease pattern.  Compared to EKG 01/30/2018, no significant change.  EKG 01/30/2018: Normal sinus rhythm at a rate of 65 bpm, left axis deviation, left anterior  fascicular block.  Poor progression, cannot exclude anteroseptal infarct old.  Nonspecific T abnormality.  No significant change from 07/31/2017.  Assessment     ICD-10-CM   1. Essential hypertension  I10 EKG 12-Lead    2. Orthostatic hypotension  I95.1     3. Precordial pain  R07.2 PCV ECHOCARDIOGRAM COMPLETE    PCV MYOCARDIAL PERFUSION WO LEXISCAN       There are no discontinued medications.  Meds ordered this encounter  Medications   hydrALAZINE (APRESOLINE) 25 MG tablet    Sig: Take 1 tablet (25 mg total) by mouth daily as needed (for BP >140/90 mmHg, up to three times daily).    Dispense:  30 tablet    Refill:  3    Recommendations:   URIEL HORKEY is a 65 y.o. obese Caucasian female with history of NSTEMI with normal coronary arteries, diabetes mellitus, orthostatic hypotension, chronic chest pain syndrome, recurrent syncope status post loop recorder implantation by [facility] cardiology.  She also has history of chronic severe back pain, bronchial asthma and severe restrictive lung disease, severe GERD, distal esophageal stricture status post dilation, peptic ulcer disease, depression.  Patient is a retired [employer] scientist.  Patient presents for 62-monthfollow-up of hypertension and orthostatic hypotension.  At last office visit patient was stable and no changes were made.  Patient remains relatively stable.  However she does continue to have both exertional and nonexertional chest pain.  Her last ischemic evaluation was greater than 10 years ago.  We will therefore obtain repeat echocardiogram as well as nuclear stress test.  In regard to blood pressure management, patient's blood pressure is well controlled today.  She does note headaches with blood pressures >>998mmHg systolic.  I have therefore provided hydralazine 25 mg to be taken as needed for blood pressure >140/90 mmHg.  Patient verbalized understanding agreement.  Given patient's blood pressure is typically soft and she has  history of orthostatic dizziness do not recommend additional daily antihypertensive medications at this time.  Patient is otherwise stable from a cardiovascular standpoint.  Follow-up in 6 months, sooner if needed.    CAlethia Berthold PA-C 09/12/2021, 1:39 PM Office: [phone]

## 2021-09-19 DIAGNOSIS — G4733 Obstructive sleep apnea (adult) (pediatric): Secondary | ICD-10-CM | POA: Diagnosis not present

## 2021-09-19 DIAGNOSIS — J45909 Unspecified asthma, uncomplicated: Secondary | ICD-10-CM | POA: Diagnosis not present

## 2021-09-27 ENCOUNTER — Other Ambulatory Visit: Payer: Self-pay

## 2021-09-27 ENCOUNTER — Ambulatory Visit: Payer: Medicare Other

## 2021-09-27 DIAGNOSIS — R072 Precordial pain: Secondary | ICD-10-CM

## 2021-10-03 ENCOUNTER — Other Ambulatory Visit: Payer: Self-pay | Admitting: Family Medicine

## 2021-10-03 ENCOUNTER — Other Ambulatory Visit: Payer: Self-pay

## 2021-10-03 ENCOUNTER — Ambulatory Visit: Payer: Medicare Other | Admitting: Podiatry

## 2021-10-03 DIAGNOSIS — Z79891 Long term (current) use of opiate analgesic: Secondary | ICD-10-CM | POA: Diagnosis not present

## 2021-10-03 DIAGNOSIS — G894 Chronic pain syndrome: Secondary | ICD-10-CM | POA: Diagnosis not present

## 2021-10-03 DIAGNOSIS — G893 Neoplasm related pain (acute) (chronic): Secondary | ICD-10-CM | POA: Diagnosis not present

## 2021-10-03 DIAGNOSIS — M47816 Spondylosis without myelopathy or radiculopathy, lumbar region: Secondary | ICD-10-CM | POA: Diagnosis not present

## 2021-10-03 DIAGNOSIS — L600 Ingrowing nail: Secondary | ICD-10-CM

## 2021-10-03 DIAGNOSIS — M79676 Pain in unspecified toe(s): Secondary | ICD-10-CM

## 2021-10-03 NOTE — Progress Notes (Signed)
  Subjective:  Patient ID: Shelby Anderson, female    DOB: 18-Feb-1956,  MRN: [id number]  Chief Complaint  Patient presents with   Ingrown Toenail    Bilateral great toenail removal. Left over right.     65 y.o. female presents with the above complaint. History confirmed with patient. Requests total removal of both nails. Last A1c 7.9  Objective:  Physical Exam: warm, good capillary refill, no trophic changes or ulcerative lesions, normal DP and PT pulses, and normal sensory exam.  Painful ingrowing nail at both borders of the left, right, hallux; without warmth, erythema or drainage  Assessment:   1. Ingrown nail   2. Pain around toenail    Plan:  Patient was evaluated and treated and all questions answered.  Ingrown Nail, bilateral -Patient elects to proceed with ingrown toenail removal today -Ingrown nail excised. See procedure note. -We discussed r/b of procedure, her A1c is below 8 which should not put her at increased risk of infection. -Educated on post-procedure care including soaking. Written instructions provided. -Rx for Cortisporin drops -Patient to follow up in 2 weeks for nail check.  Procedure: Excision of Ingrown Toenail Location: Bilateral 1st toe  Anesthesia: Lidocaine 1% plain; 78mL, digital block. Skin Prep: Betadine. Dressing: Silvadene; telfa; dry, sterile, compression dressing. Technique: Following skin prep, the toe was exsanguinated and a tourniquet was secured at the base of the toe. The affected nail was freed and excised. Chemical matrixectomy was then performed with phenol and irrigated out with alcohol. The tourniquet was then removed and sterile dressing applied. Disposition: Patient tolerated procedure well. Patient to return in 2 weeks for follow-up.  Return in about 2 weeks (around 10/17/2021) for Nail Check.

## 2021-10-03 NOTE — Patient Instructions (Signed)

## 2021-10-04 MED ORDER — ONETOUCH DELICA PLUS LANCET30G MISC: 1 refills | Status: DC

## 2021-10-04 NOTE — Addendum Note (Signed)
Addended by: Agnes Lawrence on: 10/04/2021 08:52 AM   Modules accepted: Orders

## 2021-10-04 NOTE — Telephone Encounter (Signed)
Rx done. 

## 2021-10-05 ENCOUNTER — Other Ambulatory Visit: Payer: Self-pay | Admitting: Family Medicine

## 2021-10-05 DIAGNOSIS — F339 Major depressive disorder, recurrent, unspecified: Secondary | ICD-10-CM

## 2021-10-14 ENCOUNTER — Other Ambulatory Visit: Payer: Self-pay | Admitting: Nurse Practitioner

## 2021-10-17 ENCOUNTER — Ambulatory Visit: Payer: Medicare Other | Admitting: Podiatry

## 2021-10-17 ENCOUNTER — Other Ambulatory Visit: Payer: Self-pay

## 2021-10-17 DIAGNOSIS — M79676 Pain in unspecified toe(s): Secondary | ICD-10-CM | POA: Diagnosis not present

## 2021-10-17 DIAGNOSIS — L600 Ingrowing nail: Secondary | ICD-10-CM | POA: Diagnosis not present

## 2021-10-17 NOTE — Progress Notes (Signed)
  Subjective:  Patient ID: Shelby Anderson, female    DOB: 20-Jun-1956,  MRN: [id number]  Chief Complaint  Patient presents with   Ingrown Toenail    Nail check of bilateral great ingrown total removal. Pt complains of bleeding that covers the entire bandage when taken off.    65 y.o. female presents for follow up of nail procedure. History confirmed with patient.   Objective:  Physical Exam: Ingrown nail avulsion site: overlying soft crust, no warmth, no drainage, and no erythema. Healing granular nail beds. Assessment:   1. Ingrown nail   2. Pain around toenail    Plan:  Patient was evaluated and treated and all questions answered.  S/p Ingrown Toenail Excision, bilateral -Healing well, still with healing nail beds. No signs of infection. -Can d/c soaking, continue abx ointment and band-aid  -F/u in 2 weeks to assess healing.

## 2021-10-31 DIAGNOSIS — Z79891 Long term (current) use of opiate analgesic: Secondary | ICD-10-CM | POA: Diagnosis not present

## 2021-10-31 DIAGNOSIS — G894 Chronic pain syndrome: Secondary | ICD-10-CM | POA: Diagnosis not present

## 2021-10-31 DIAGNOSIS — M47816 Spondylosis without myelopathy or radiculopathy, lumbar region: Secondary | ICD-10-CM | POA: Diagnosis not present

## 2021-10-31 DIAGNOSIS — G893 Neoplasm related pain (acute) (chronic): Secondary | ICD-10-CM | POA: Diagnosis not present

## 2021-11-03 ENCOUNTER — Ambulatory Visit: Payer: Medicare Other | Admitting: Podiatry

## 2021-11-03 ENCOUNTER — Other Ambulatory Visit: Payer: Self-pay

## 2021-11-03 DIAGNOSIS — M79676 Pain in unspecified toe(s): Secondary | ICD-10-CM | POA: Diagnosis not present

## 2021-11-03 DIAGNOSIS — L600 Ingrowing nail: Secondary | ICD-10-CM | POA: Diagnosis not present

## 2021-11-03 NOTE — Progress Notes (Signed)
  Subjective:  Patient ID: Shelby Anderson, female    DOB: [birth year],  MRN: [id number]  No chief complaint on file.  65 y.o. female presents for follow up of nail procedure. History confirmed with patient. States the toes continue to have a little bleeding and still having pain and not able to put shoes on yet.  Objective:  Physical Exam: Ingrown nail avulsion site: overlying soft crust, no warmth, no drainage, and no erythema. Healing granular nail beds. Assessment:   1. Ingrown nail   2. Pain around toenail    Plan:  Patient was evaluated and treated and all questions answered.  S/p Ingrown Toenail Excision, bilateral -Still a little slow to heal but well-appearing. Advised to apply neosporin and band-aid daily x1 week. This was applied today. Thereafter apply neosporin and band-aid qOD with a break to leave OTA between these. -F/u should the wound not heal in 2 weeks. Advised she can send me a picture through MyChart if she prefers so I can monitor the healing.

## 2021-11-24 ENCOUNTER — Other Ambulatory Visit: Payer: Self-pay | Admitting: Family Medicine

## 2021-11-29 ENCOUNTER — Other Ambulatory Visit: Payer: Self-pay | Admitting: Family Medicine

## 2021-11-30 DIAGNOSIS — G894 Chronic pain syndrome: Secondary | ICD-10-CM | POA: Diagnosis not present

## 2021-11-30 DIAGNOSIS — G893 Neoplasm related pain (acute) (chronic): Secondary | ICD-10-CM | POA: Diagnosis not present

## 2021-11-30 DIAGNOSIS — Z79891 Long term (current) use of opiate analgesic: Secondary | ICD-10-CM | POA: Diagnosis not present

## 2021-11-30 DIAGNOSIS — M47816 Spondylosis without myelopathy or radiculopathy, lumbar region: Secondary | ICD-10-CM | POA: Diagnosis not present

## 2021-12-06 ENCOUNTER — Ambulatory Visit (INDEPENDENT_AMBULATORY_CARE_PROVIDER_SITE_OTHER): Payer: Medicare Other | Admitting: Podiatry

## 2021-12-06 ENCOUNTER — Other Ambulatory Visit: Payer: Self-pay

## 2021-12-06 DIAGNOSIS — E1151 Type 2 diabetes mellitus with diabetic peripheral angiopathy without gangrene: Secondary | ICD-10-CM

## 2021-12-06 DIAGNOSIS — E119 Type 2 diabetes mellitus without complications: Secondary | ICD-10-CM

## 2021-12-06 DIAGNOSIS — M79676 Pain in unspecified toe(s): Secondary | ICD-10-CM | POA: Diagnosis not present

## 2021-12-10 ENCOUNTER — Encounter: Payer: Self-pay | Admitting: Podiatry

## 2021-12-10 NOTE — Progress Notes (Signed)
ANNUAL DIABETIC FOOT EXAM  Subjective: Shelby Anderson presents today for for annual diabetic foot examination.  Patient relates 4 year h/o diabetes.  Patient denies any h/o foot wounds.  Patient has been diagnosed with neuropathy.  Patient's blood sugar was 129 mg/dl today.   Shelby Macadam, MD is patient's PCP.  She had matrixectomies  performed bilateral great toes by Dr. March Rummage in November. States she still has some soreness of both great toes and cannot wear shoes at times.  Past Medical History:  Diagnosis Date   Allergy    Anemia    "chronic"   Angina    Prinzmetal angina   Anxiety and depression    Arthritis    Asthma    Atrial fibrillation ([place])    h/o "AF w/frequent PVCs"   Breast cancer ([place])    Left   Cancer (HCC)    hx of skin cancer    CHF (congestive heart failure) (HCC)    Chronic back pain greater than 3 months duration    on chronic narcotics, treated at pain clinic   Chronic respiratory failure ([place]) 09/15/2015   ONO 09/04/15 + desats >begin O2 at 2l/ m    Colon polyps    hyperplastic   Coronary artery disease    Arrythmia, orthostatic hypotension, HLD, HTN; sees Dr. Einar Gip   Difficult intubation    "TMJ & woke up when they were still cutting on me"   Dysrhythmia    sees Dr. Einar Gip and a cardiologist at [facility]   Esophageal stricture    Family history of melanoma ]   Family history of pancreatic cancer    Fatty liver    Fatty liver    Fibroids    Fibromyalgia    "in my legs"   GERD (gastroesophageal reflux disease)    hx hiatal hernia, stricture and gastric ulcer   Headache(784.0)    migraines   Heart murmur    Hiatal hernia    History of loop recorder    History of migraines    "dx'd when I was in my teens"   Hyperlipemia    Hypertension    Mental disorder    Mild episode of recurrent major depressive disorder ([place]) 12/06/2015   Myocardial infarction [facility]) 1980's & 1990;   sees Dr. Einar Gip   OSA (obstructive sleep apnea)     Personal history of chemotherapy    Personal history of radiation therapy    Pneumonia    multiple times   PONV (postoperative nausea and vomiting)    Recurrent upper respiratory infection (URI)    Shortness of breath 11/20/11   "all the time", sees pulmonlogy, ? asthma   Stenotic cervical os    Stomach ulcer    "3 small; found in 05/2011"   TMJ (dislocation of temporomandibular joint)    Tuberculosis    + TB SKIN TEST   Type 2 diabetes mellitus without complication, without long-term current use of insulin ([place]) 12/06/2015   not on meds    Patient Active Problem List   Diagnosis Date Noted   Spondylosis without myelopathy or radiculopathy, cervical region 04/21/2021   Acute on chronic diastolic CHF (congestive heart failure) ([place]) 10/01/2019   Neutropenia with fever ([place]) 10/01/2019   Genetic testing 09/17/2019   Family history of melanoma    Family history of pancreatic cancer    Malignant neoplasm of upper-outer quadrant of left breast in female, estrogen receptor negative ([place]) 09/07/2019   Encounter for loop recorder  at end of battery life 04/24/2018  ° Esophageal stricture 07/01/2017  ° Hyperlipidemia associated with type 2 diabetes mellitus (HCC) 05/20/2017  ° Allergic rhinitis 03/11/2017  ° Dilated cardiomyopathy (HCC) 02/07/2017  ° 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 11/13/2016  ° Morbid obesity due to excess calories (HCC) 09/26/2016  ° S/P left TKA 09/25/2016  ° Increased endometrial stripe thickness 06/27/2016  ° Intramural leiomyoma of uterus 06/27/2016  ° Type 2 diabetes mellitus without complication, without long-term current use of insulin (HCC) 12/06/2015  ° Hypertension associated with diabetes (HCC) 07/19/2015  ° Arrhythmia 07/19/2015  ° Orthostatic hypotension 07/19/2015  ° GERD (gastroesophageal reflux disease) 07/19/2015  ° Chronic back pain 07/19/2015  ° Neuropathy 07/19/2015  ° Symptomatic PVCs 11/02/2014  ° Syncope 11/02/2014  ° Chest pain 12/23/2013  °  Disorder of cervix 03/10/2013  ° Vaginal atrophy 03/10/2013  ° OSA (obstructive sleep apnea) 07/31/2012  ° Asthma 04/29/2012  ° Coronary artery disease 11/20/2011  ° °Past Surgical History:  °Procedure Laterality Date  ° ACHILLES TENDON REPAIR  1970's  ° left ankle  ° ARTHROSCOPIC REPAIR ACL    ° left knee cap  ° BREAST BIOPSY Right 04/08/2013  ° again in October or November 2020  ° BREAST LUMPECTOMY WITH RADIOACTIVE SEED AND SENTINEL LYMPH NODE BIOPSY Left 12/09/2019  ° Procedure: LEFT BREAST LUMPECTOMY WITH RADIOACTIVE SEED AND LEFT AXILLARY SENTINEL LYMPH NODE BIOPSY;  Surgeon: Wakefield, Matthew, MD;  Location: [facility] OR;  Service: General;  Laterality: Left;  PEC BLOCK  ° CARDIAC CATHETERIZATION    ° loop recorder  ° CARPAL TUNNEL RELEASE  unknown  ° left hand  ° COLONOSCOPY    ° ESOPHAGOGASTRODUODENOSCOPY (EGD) WITH PROPOFOL N/A 10/29/2017  ° Procedure: ESOPHAGOGASTRODUODENOSCOPY (EGD) WITH PROPOFOL;  Surgeon: Perry, John N, MD;  Location: [facility] END[facility];  Service: Endoscopy;  Laterality: N/A;  ° LOOP RECORDER IMPLANT    ° PORT-A-CATH REMOVAL N/A 12/09/2019  ° Procedure: REMOVAL PORT-A-CATH;  Surgeon: Wakefield, Matthew, MD;  Location: [facility] OR;  Service: General;  Laterality: N/A;  ° PORTACATH PLACEMENT N/A 09/23/2019  ° Procedure: INSERTION PORT-A-CATH Right Internal JugularWITH ULTRASOUND;  Surgeon: Wakefield, Matthew, MD;  Location: [facility] OR;  Service: General;  Laterality: N/A;  ° post ganglionectomy  1970's  ° "for migraine headaches"  ° pouch string  79,81,83  ° "did this 3 times (once w/each pregnancy)"  ° SAVORY DILATION N/A 10/29/2017  ° Procedure: SAVORY DILATION;  Surgeon: Perry, John N, MD;  Location: [facility];  Service: Endoscopy;  Laterality: N/A;  ° TOTAL KNEE ARTHROPLASTY Left 09/25/2016  ° Procedure: LEFT TOTAL KNEE ARTHROPLASTY;  Surgeon: Matthew Olin, MD;  Location: [facility] ORS;  Service: Orthopedics;  Laterality: Left;  ° TUBAL LIGATION  1980's  ° °Current Outpatient Medications on File Prior to Visit   °Medication Sig Dispense Refill  ° albuterol (PROVENTIL) (2.5 MG/3ML) 0.083% nebulizer solution Take 3 mLs (2.5 mg total) by nebulization every 6 (six) hours as needed for wheezing or shortness of breath (J45.40). 75 mL 3  ° albuterol (VENTOLIN HFA) 108 (90 Base) MCG/ACT inhaler Inhale 2 puffs into the lungs every 6 (six) hours as needed for wheezing or shortness of breath. 3 g 3  ° aspirin EC 81 MG tablet Take 81 mg at bedtime by mouth.     ° atorvastatin (LIPITOR) 40 MG tablet TAKE 1 TABLET BY MOUTH EVERYDAY AT BEDTIME. 90 tablet 1  ° Blood Glucose Monitoring Suppl (ONE TOUCH ULTRA 2) w/Device KIT USE TO CHECK BLOOD SUGAR   2 TIMES A DAY 1 kit 0  ° budesonide-formoterol (SYMBICORT) 80-4.5 MCG/ACT inhaler INHALE 2 PUFFS INTO THE LUNGS EVERY MORNING AND ANOTHER 2 PUFFS 12 HOURS LATER 30.6 Inhaler 1  ° buPROPion (WELLBUTRIN XL) 150 MG 24 hr tablet TAKE 1 TABLET BY MOUTH EVERY DAY 90 tablet 0  ° Calcium Carbonate-Vitamin D 600-200 MG-UNIT TABS Take 1 tablet by mouth daily.    ° CINNAMON PO Take 1,000 mg 2 (two) times daily by mouth.    ° doxepin (SINEQUAN) 10 MG capsule Take 30 mg by mouth at bedtime.    ° esomeprazole (NEXIUM) 40 MG capsule Take 1 capsule (40 mg total) by mouth 2 (two) times daily. 180 capsule 1  ° famotidine (PEPCID) 20 MG tablet Take 1 tablet (20 mg total) by mouth 2 (two) times daily as needed for heartburn or indigestion. 180 tablet 1  ° fluticasone (FLONASE) 50 MCG/ACT nasal spray Place 2 sprays at bedtime as needed into both nostrils for allergies or rhinitis.     ° furosemide (LASIX) 20 MG tablet Take 1 tablet (20 mg total) by mouth daily as needed for fluid. 90 tablet 3  ° glipiZIDE (GLUCOTROL XL) 5 MG 24 hr tablet TAKE 1 TABLET BY MOUTH EVERYDAY WITH BREAKFAST. **DUE FOR YEARLY PHYSICAL** 90 tablet 1  ° hydrALAZINE (APRESOLINE) 25 MG tablet Take 1 tablet (25 mg total) by mouth daily as needed (for BP >140/90 mmHg, up to three times daily). 30 tablet 3  ° L-Methylfolate (DEPLIN) 7.5 MG TABS  Take 7.5 mg by mouth daily with breakfast.    ° Lancets (ONETOUCH DELICA PLUS LANCET30G) MISC USE TO CHECK BLOOD SUGAR 2 TIMES A DAY 100 each 1  ° Lancets (ONETOUCH DELICA PLUS LANCET30G) MISC Use as directed 100 each 1  ° levothyroxine (SYNTHROID) 50 MCG tablet TAKE 1 TABLET BY MOUTH EVERY DAY 90 tablet 1  ° LYRICA 150 MG capsule Take 150 mg by mouth 3 (three) times daily.   2  ° metFORMIN (GLUCOPHAGE) 500 MG tablet TAKE 1 TABLET BY MOUTH TWICE A DAY WITH MEALS 180 tablet 1  ° metoprolol succinate (TOPROL-XL) 25 MG 24 hr tablet TAKE 1 TABLET BY MOUTH DAILY. TAKE WITH OR IMMEDIATELY FOLLOWING A MEAL. 90 tablet 3  ° Misc Natural Products (GLUCOS-CHONDROIT-MSM COMPLEX PO) Take 2 tablets by mouth daily.    ° mometasone (ELOCON) 0.1 % cream Apply topically.    ° montelukast (SINGULAIR) 10 MG tablet TAKE 1 TABLET BY MOUTH EVERYDAY AT BEDTIME 90 tablet 1  ° Multiple Vitamin (MULITIVITAMIN WITH MINERALS) TABS Take 1 tablet by mouth daily.    ° mupirocin cream (BACTROBAN) 2 % Apply once daily to lesions on feet as needed. 30 g 1  ° nitroGLYCERIN (NITROSTAT) 0.4 MG SL tablet Place 1 tablet (0.4 mg total) under the tongue every 5 (five) minutes as needed for chest pain. 10 tablet 2  ° ondansetron (ZOFRAN) 4 MG tablet Take 1 tablet (4 mg total) by mouth every 8 (eight) hours as needed for nausea or vomiting. 80 tablet 5  ° ONETOUCH ULTRA test strip USE TO CHECK BLOOD SUGAR 2 TIMES A DAY 100 strip 3  ° oxyCODONE (ROXICODONE) 15 MG immediate release tablet Take 15 mg by mouth 5 (five) times daily.    ° potassium chloride (KLOR-CON) 10 MEQ tablet TAKE 2 TABLETS BY MOUTH EVERY DAY 180 tablet 0  ° promethazine (PHENERGAN) 25 MG tablet TAKE 1 TABLET BY MOUTH EVERYDAY AT BEDTIME 90 tablet 1  ° Spacer/Aero-Holding Chambers   DEVI 1 Device by Does not apply route daily as needed. 1 Device 0   sucralfate (CARAFATE) 1 g tablet DISSOLVE 1 TAB IN 1 TABLESPOON OF DISTILLED WATER FOR 15 MINS. TAKE 4XDAILY WITH MEALS & AT BEDTIME 360 tablet 3    tiZANidine (ZANAFLEX) 4 MG tablet Take 4 mg by mouth every 8 (eight) hours as needed for muscle spasms.     vitamin B-12 (CYANOCOBALAMIN) 500 MCG tablet Take 500 mcg by mouth daily.      vitamin E 200 UNIT capsule Take 200 Units by mouth daily.     No current facility-administered medications on file prior to visit.    Allergies  Allergen Reactions   Lodine [Etodolac] Anaphylaxis, Hives and Swelling   Oxycontin [Oxycodone Hcl] Anaphylaxis    hives, trouble breathing, tongue swelling (Only Oxycontin) Tolerates plain oxycodone.   Penicillins Anaphylaxis    Told by a surgeon never to take it again. Has patient had a PCN reaction causing immediate rash, facial/tongue/throat swelling, SOB or lightheadedness with hypotension: Yes Has patient had a PCN reaction causing severe rash involving mucus membranes or skin necrosis: Unknown Has patient had a PCN reaction that required hospitalization: No Has patient had a PCN reaction occurring within the last 10 years: No If all of the above answers are "NO", then may proceed with Cephalosporin use.   Aspirin Other (See Comments)    High-dose caused GI Bleeds   Darvocet [Propoxyphene N-Acetaminophen] Hives   Nitroglycerin Other (See Comments)    IV-BP drops dramatically Can take po   Tramadol Hives and Itching   Ultram [Tramadol Hcl] Hives   Valium Other (See Comments)    Circulation problems. "Legs turned black".   Social History   Occupational History   Occupation: Retired Therapist, sports  Tobacco Use   Smoking status: Never   Smokeless tobacco: Never  Scientific laboratory technician Use: Never used  Substance and Sexual Activity   Alcohol use: No    Alcohol/week: 0.0 standard drinks   Drug use: No   Sexual activity: Not Currently    Birth control/protection: Surgical    Comment: 1st intercourse 66 yo-Fewer than 5 partners   Family History  Problem Relation Age of Onset   Malignant hyperthermia Father    Hypertension Father    Heart disease Father     Diabetes Father    Cancer Father        skin   Hypertension Mother    Heart disease Mother    Multiple myeloma Mother    Cancer Sister        CERVICAL   Hypertension Sister    Cancer Brother 22       MELANOMA   Heart disease Maternal Grandmother    Other Maternal Grandmother 32       complications of childbirth   Heart disease Maternal Grandfather    Cancer Paternal Grandmother        ?    Heart disease Paternal Grandmother    Heart disease Paternal Grandfather    Cancer Brother        LUNG   Diabetes Sister    Hypertension Sister    Heart disease Sister    Cancer Sister    Cancer Brother    Pancreatic cancer Niece 84   Cancer Nephew 21       unknown- currently in the [place]   Anesthesia problems Neg Hx    Hypotension Neg Hx    Pseudochol deficiency Neg Hx  Colon cancer Neg Hx    Esophageal cancer Neg Hx    Rectal cancer Neg Hx    Stomach cancer Neg Hx    Breast cancer Neg Hx    Immunization History  Administered Date(s) Administered   Influenza Split 08/19/2011   Influenza Whole 01/03/2013   Influenza,inj,Quad PF,6+ Mos 08/20/2013, 09/15/2014, 08/26/2015, 08/01/2016, 08/22/2017, 10/15/2018, 09/24/2019, 12/07/2020   Influenza-Unspecified 08/03/2016   Moderna Sars-Covid-2 Vaccination 03/11/2020, 04/08/2020, 08/03/2020   Pneumococcal Conjugate-13 02/24/2021   Pneumococcal Polysaccharide-23 08/19/2011, 12/12/2017   Tdap 06/28/2014     Review of Systems: Negative except as noted in the HPI.   Objective: There were no vitals filed for this visit.  BRENLEY PRIORE is a pleasant 66 y.o. female in NAD. AAO X 3.  Vascular Examination: CFT <3 seconds b/l LE. Faintly palpable DP pulses b/l. Nonpalpable PT pulses b/l. Pedal hair absent b/l. Skin temperature gradient WNL b/l. No pain with calf compression b/l. No edema b/l LE. No cyanosis or clubbing noted b/l LE.  Dermatological Examination: Pedal integument with normal turgor, texture and tone b/l LE. No  open wounds b/l. No interdigital macerations b/l. Toenails 2-5 bilaterally elongated, thickened, discolored with subungual debris. +Tenderness with dorsal palpation of nailplates. No hyperkeratotic or porokeratotic lesions present. Procedure site of bilateral great toes noted to be completely healed with no erythema, no edema, no drainage, no purulence.   Musculoskeletal Examination: Normal muscle strength 5/5 to all lower extremity muscle groups bilaterally. No pain, crepitus or joint limitation noted with ROM b/l LE. No gross bony pedal deformities b/l. Patient ambulates independently without assistive aids.  Footwear Assessment: Does the patient wear appropriate shoes? Yes Does the patient need inserts/orthotics? No.  Neurological Examination: Pt has subjective symptoms of neuropathy. Protective sensation intact 5/5 intact bilaterally with 10g monofilament b/l.  Hemoglobin A1C Latest Ref Rng & Units 02/27/2021 01/06/2021  HGBA1C 4.6 - 6.5 % 7.4(H) 7.2(A)  Some recent data might be hidden   Assessment: 1. Pain around toenail   2. Diabetes mellitus type 2 with peripheral artery disease ([place])   3. Encounter for diabetic foot exam ([place])      ADA Risk Categorization: High Risk  Patient has one or more of the following: Loss of protective sensation Absent pedal pulses Severe Foot deformity History of foot ulcer  Plan: -Diabetic foot examination performed today. -Continue foot and shoe inspections daily. Monitor blood glucose per PCP/Endocrinologist's recommendations. -Mycotic toenails 2-5 bilaterally were debrided in length and girth with sterile nail nippers and dremel without iatrogenic bleeding. -Nailbeds bilateral great toes are healed. She still has tenderness. Recommended applying Neosporin and fabric band-aids when wearing shoes. -Patient/POA to call should there be question/concern in the interim.  Return in about 3 months (around 03/06/2022).  Marzetta Board, DPM

## 2021-12-13 ENCOUNTER — Inpatient Hospital Stay: Payer: Medicare Other | Admitting: Hematology and Oncology

## 2021-12-18 DIAGNOSIS — J45909 Unspecified asthma, uncomplicated: Secondary | ICD-10-CM | POA: Diagnosis not present

## 2021-12-18 DIAGNOSIS — G4733 Obstructive sleep apnea (adult) (pediatric): Secondary | ICD-10-CM | POA: Diagnosis not present

## 2021-12-21 NOTE — Assessment & Plan Note (Signed)
09/07/2019:Routine screening mammogram detected a 2.1cm mass in the left breast and no left axillary adenopathy. 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  Recommendations: 1. Neoadjuvant chemotherapy with Taxotere/Cytoxanx2starting 09/24/2019(discontinued because of respiratory failure after each chemotherapy) 2. Breast conserving surgery1/05/2020: Microscopic foci of residual IDC grade 3, largest measured 2.5 mm foci of DCIS high-grade, resection margins negative, ER 0%, PR 0%, HER-2 negative, Ki-67 20% on the final path, 0/1 lymph node negative 3. Adjuvant radiation therapy2/09/2020-02/08/2020 --------------------------------------------------------------------------------------------------------------------------------------------- 1.Hospitalization 33/78/0108-09/07/5398: Diastolic CHF and asthma exacerbation with hypoxia (prednisone taper and Lasix) 2.Hospitalization 10/30/2019-11/03/2019: Respiratory failure  Breast cancer surveillance: 1. 12/22/2021 breast exam: Benign 2. 12/09/2020: Benign breast density category C  Return to clinic in 1 year for follow-up

## 2021-12-21 NOTE — Progress Notes (Signed)
Patient Care Team: Caren Macadam, MD as PCP - General (Family Medicine) Rigoberto Noel, MD as Consulting Physician (Pulmonary Disease) Adrian Prows, MD as Consulting Physician (Cardiology) Lenon Oms, MD as Referring Physician (Obstetrics and Gynecology) Veneda Melter, MD as Referring Physician (Cardiology) [name], MD as Consulting Physician (Anesthesiology) Rolm Bookbinder, MD as Consulting Physician (General Surgery) Nicholas Lose, MD as Consulting Physician (Hematology and Oncology) Kyung Rudd, MD as Consulting Physician (Radiation Oncology) Alda Berthold, DO as Consulting Physician (Neurology) Jarome Matin, MD as Consulting Physician (Dermatology) Viona Gilmore, [facility] as Pharmacist (Pharmacist)  DIAGNOSIS:    ICD-10-CM   1. Malignant neoplasm of upper-outer quadrant of left breast in female, estrogen receptor negative ([place])  C50.412    Z17.1       SUMMARY OF ONCOLOGIC HISTORY: Oncology History  Malignant neoplasm of upper-outer quadrant of left breast in female, estrogen receptor negative ([place])  09/07/2019 Initial Diagnosis   Routine screening mammogram detected a 2.1cm mass in the left breast and no left axillary adenopathy. 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   09/15/2019 Genetic Testing   Genetic testing reported out on September 15, 2019 through the Multi-cancer panel found no pathogenic mutations. The Multi-Gene Panel offered by [facility] includes sequencing and/or deletion duplication testing of the following 85 genes: AIP, ALK, APC, ATM, AXIN2,BAP1,  BARD1, BLM, BMPR1A, BRCA1, BRCA2, BRIP1, CASR, CDC73, CDH1, CDK4, CDKN1B, CDKN1C, CDKN2A (p14ARF), CDKN2A (p16INK4a), CEBPA, CHEK2, CTNNA1, DICER1, DIS3L2, EGFR (c.2369C>T, p.Thr790Met variant only), EPCAM (Deletion/duplication testing only), FH, FLCN, GATA2, GPC3, GREM1 (Promoter region deletion/duplication testing only), HOXB13 (c.251G>A, p.Gly84Glu), HRAS, KIT,  MAX, MEN1, MET, MITF (c.952G>A, p.Glu318Lys variant only), MLH1, MSH2, MSH3, MSH6, MUTYH, NBN, NF1, NF2, NTHL1, PALB2, PDGFRA, PHOX2B, PMS2, POLD1, POLE, POT1, PRKAR1A, PTCH1, PTEN, RAD50, RAD51C, RAD51D, RB1, RECQL4, RET, RNF43, RUNX1, SDHAF2, SDHA (sequence changes only), SDHB, SDHC, SDHD, SMAD4, SMARCA4, SMARCB1, SMARCE1, STK11, SUFU, TERC, TERT, TMEM127, TP53, TSC1, TSC2, VHL, WRN and WT1.  The test report has been scanned into EPIC and is located under the Molecular Pathology section of the Results Review tab.  A portion of the result report is included below for reference.    09/24/2019 - 10/15/2019 Chemotherapy   palonosetron (ALOXI) injection 0.25 mg, 0.25 mg, Intravenous,  Once, 2 of 6 cycles. Administration: 0.25 mg (09/24/2019), 0.25 mg (10/15/2019)  pegfilgrastim (NEULASTA ONPRO KIT) injection 6 mg, 6 mg, Subcutaneous, Once, 2 of 6 cycles. Administration: 6 mg (09/24/2019), 6 mg (10/15/2019)  cyclophosphamide (CYTOXAN) 1,100 mg in sodium chloride 0.9 % 250 mL chemo infusion, 500 mg/m2 = 1,100 mg (83.3 % of original dose 600 mg/m2), Intravenous,  Once, 2 of 6 cycles. Dose modification: 500 mg/m2 (original dose 600 mg/m2, Cycle 1, Reason: Provider Judgment). Administration: 1,100 mg (09/24/2019), 1,100 mg (10/15/2019)  DOCEtaxel (TAXOTERE) 140 mg in sodium chloride 0.9 % 250 mL chemo infusion, 65 mg/m2 = 140 mg (86.7 % of original dose 75 mg/m2), Intravenous,  Once, 2 of 6 cycles. Dose modification: 65 mg/m2 (original dose 75 mg/m2, Cycle 1, Reason: Provider Judgment). Administration: 140 mg (09/24/2019), 140 mg (10/15/2019)  fosaprepitant (EMEND) 150 mg in sodium chloride 0.9 % 145 mL IVPB, Intravenous,  Once, 2 of 6 cycles. Administration:  (09/24/2019),  (10/15/2019)   12/09/2019 Surgery   Left lumpectomy Donne Hazel) [id number]): microscopic focus of residual IDC, grade 3, with high grade DCIS, clear margins. No regional lymph nodes were examined.   12/10/2019 Cancer Staging   Staging  form: Breast, AJCC 8th Edition - Pathologic stage from 12/10/2019: No Stage Recommended (ypT1a, pN0, cM0) - Signed by Gardenia Phlegm, NP on 12/23/2019    01/13/2020 - 02/08/2020 Radiation Therapy   The patient initially received a dose of 42.56 Gy in 16 fractions to the breast using whole-breast tangent fields. This was delivered using a 3-D conformal technique. The pt received a boost delivering an additional 8 Gy in 4 fractions using a electron boost with 31mV electrons. The total dose was 50.56 Gy.     CHIEF COMPLIANT: Follow-up of triple negative left breast cancer  INTERVAL HISTORY: Shelby STLAURENTis a 66y.o. 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 She presents to the clinic today for follow-up.  She denies any lumps or nodules in the breast.  ALLERGIES:  is allergic to lodine [etodolac], oxycontin [oxycodone hcl], penicillins, aspirin, darvocet [propoxyphene n-acetaminophen], nitroglycerin, tramadol, ultram [tramadol hcl], and valium.  MEDICATIONS:  Current Outpatient Medications  Medication Sig Dispense Refill   albuterol (PROVENTIL) (2.5 MG/3ML) 0.083% nebulizer solution Take 3 mLs (2.5 mg total) by nebulization every 6 (six) hours as needed for wheezing or shortness of breath (J45.40). 75 mL 3   albuterol (VENTOLIN HFA) 108 (90 Base) MCG/ACT inhaler Inhale 2 puffs into the lungs every 6 (six) hours as needed for wheezing or shortness of breath. 3 g 3   aspirin EC 81 MG tablet Take 81 mg at bedtime by mouth.      atorvastatin (LIPITOR) 40 MG tablet TAKE 1 TABLET BY MOUTH EVERYDAY AT BEDTIME. 90 tablet 1   Blood Glucose Monitoring Suppl (ONE TOUCH ULTRA 2) w/Device KIT USE TO CHECK BLOOD SUGAR 2 TIMES A DAY 1 kit 0   budesonide-formoterol (SYMBICORT) 80-4.5 MCG/ACT inhaler INHALE 2 PUFFS INTO THE LUNGS EVERY MORNING AND ANOTHER 2 PUFFS 12 HOURS LATER 30.6 Inhaler  1   buPROPion (WELLBUTRIN XL) 150 MG 24 hr tablet TAKE 1 TABLET BY MOUTH EVERY DAY 90 tablet 0   Calcium Carbonate-Vitamin D 600-200 MG-UNIT TABS Take 1 tablet by mouth daily.     CINNAMON PO Take 1,000 mg 2 (two) times daily by mouth.     doxepin (SINEQUAN) 10 MG capsule Take 30 mg by mouth at bedtime.     famotidine (PEPCID) 20 MG tablet Take 1 tablet (20 mg total) by mouth 2 (two) times daily as needed for heartburn or indigestion. 180 tablet 1   fluticasone (FLONASE) 50 MCG/ACT nasal spray Place 2 sprays at bedtime as needed into both nostrils for allergies or rhinitis.      furosemide (LASIX) 20 MG tablet Take 1 tablet (20 mg total) by mouth daily as needed for fluid. 90 tablet 3   glipiZIDE (GLUCOTROL XL) 5 MG 24 hr tablet TAKE 1 TABLET BY MOUTH EVERYDAY WITH BREAKFAST. **DUE FOR YEARLY PHYSICAL** 90 tablet 1   hydrALAZINE (APRESOLINE) 25 MG tablet Take 1 tablet (25 mg total) by mouth daily as needed (for BP >140/90 mmHg, up to three times daily). 30 tablet 3   L-Methylfolate (DEPLIN) 7.5 MG TABS Take 7.5 mg by mouth daily with breakfast.     Lancets (ONETOUCH DELICA PLUS [id number] MISC USE TO CHECK BLOOD SUGAR 2 TIMES A DAY 100 each 1   Lancets (ONETOUCH DELICA PLUS [id number] MISC Use as directed 100 each 1   levothyroxine (SYNTHROID) 50 MCG tablet TAKE 1 TABLET BY MOUTH EVERY DAY 90 tablet 1  LYRICA 150 MG capsule Take 150 mg by mouth 3 (three) times daily.   2   metFORMIN (GLUCOPHAGE) 500 MG tablet TAKE 1 TABLET BY MOUTH TWICE A DAY WITH MEALS 180 tablet 1   metoprolol succinate (TOPROL-XL) 25 MG 24 hr tablet TAKE 1 TABLET BY MOUTH DAILY. TAKE WITH OR IMMEDIATELY FOLLOWING A MEAL. 90 tablet 3   Misc Natural Products (GLUCOS-CHONDROIT-MSM COMPLEX PO) Take 2 tablets by mouth daily.     mometasone (ELOCON) 0.1 % cream Apply topically.     montelukast (SINGULAIR) 10 MG tablet TAKE 1 TABLET BY MOUTH EVERYDAY AT BEDTIME 90 tablet 1   Multiple Vitamin (MULITIVITAMIN WITH MINERALS) TABS Take  1 tablet by mouth daily.     mupirocin cream (BACTROBAN) 2 % Apply once daily to lesions on feet as needed. 30 g 1   NEXIUM 40 MG capsule TAKE 1 CAPSULE BY MOUTH TWICE A DAY 180 capsule 1   nitroGLYCERIN (NITROSTAT) 0.4 MG SL tablet Place 1 tablet (0.4 mg total) under the tongue every 5 (five) minutes as needed for chest pain. 10 tablet 2   ondansetron (ZOFRAN) 4 MG tablet Take 1 tablet (4 mg total) by mouth every 8 (eight) hours as needed for nausea or vomiting. 80 tablet 5   ONETOUCH ULTRA test strip USE TO CHECK BLOOD SUGAR 2 TIMES A DAY 100 strip 3   oxyCODONE (ROXICODONE) 15 MG immediate release tablet Take 15 mg by mouth 5 (five) times daily.     potassium chloride (KLOR-CON) 10 MEQ tablet TAKE 2 TABLETS BY MOUTH EVERY DAY 180 tablet 0   promethazine (PHENERGAN) 25 MG tablet TAKE 1 TABLET BY MOUTH EVERYDAY AT BEDTIME 90 tablet 1   Spacer/Aero-Holding Chambers DEVI 1 Device by Does not apply route daily as needed. 1 Device 0   sucralfate (CARAFATE) 1 g tablet DISSOLVE 1 TAB IN 1 TABLESPOON OF DISTILLED WATER FOR 15 MINS. TAKE 4XDAILY WITH MEALS & AT BEDTIME 360 tablet 3   tiZANidine (ZANAFLEX) 4 MG tablet Take 4 mg by mouth every 8 (eight) hours as needed for muscle spasms.     vitamin B-12 (CYANOCOBALAMIN) 500 MCG tablet Take 500 mcg by mouth daily.      vitamin E 200 UNIT capsule Take 200 Units by mouth daily.     No current facility-administered medications for this visit.    PHYSICAL EXAMINATION: ECOG PERFORMANCE STATUS: 1 - Symptomatic but completely ambulatory  Vitals:   12/22/21 1150  BP: (!) 102/59  Pulse: 64  Resp: 18  Temp: (!) 97.5 F (36.4 C)  SpO2: 96%   Filed Weights   12/22/21 1150  Weight: 221 lb 9.6 oz (100.5 kg)    BREAST: No palpable masses or nodules in either right or left breasts. No palpable axillary supraclavicular or infraclavicular adenopathy no breast tenderness or nipple discharge. (exam performed in the presence of a chaperone)  LABORATORY DATA:   I have reviewed the data as listed CMP Latest Ref Rng & Units 02/27/2021 09/30/2020 12/08/2019  Glucose 70 - 99 mg/dL 89 112(H) 150(H)  BUN 6 - 23 mg/dL 12 12 6(L)  Creatinine 0.40 - 1.20 mg/dL 0.65 0.67 0.60  Sodium 135 - 145 mEq/L 142 142 143  Potassium 3.5 - 5.1 mEq/L 4.1 4.2 3.7  Chloride 96 - 112 mEq/L 101 101 102  CO2 19 - 32 mEq/L 34(H) 29 27  Calcium 8.4 - 10.5 mg/dL 9.0 9.8 9.2  Total Protein 6.0 - 8.3 g/dL 6.5 7.5 6.6  Total Bilirubin  0.2 - 1.2 mg/dL 0.4 0.5 0.3  Alkaline Phos 39 - 117 U/L 68 - 63  AST 0 - 37 U/L _0 ALT 0 - 35 U/L _1 Lab Results  Component Value Date   WBC 5.5 02/27/2021   HGB 12.6 02/27/2021   HCT 38.1 02/27/2021   MCV 85.1 02/27/2021   PLT 222.0 02/27/2021   NEUTROABS 3.5 02/27/2021    ASSESSMENT & PLAN:  Malignant neoplasm of upper-outer quadrant of left breast in female, estrogen receptor negative ([place]) 09/07/2019:Routine screening mammogram detected a 2.1cm mass in the left breast and no left axillary adenopathy. 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   Recommendations: 1.  Neoadjuvant chemotherapy with Taxotere/Cytoxan x 2 starting 09/24/2019 (discontinued because of respiratory failure after each chemotherapy) 2. Breast conserving surgery 12/09/2019: Microscopic foci of residual IDC grade 3, largest measured 2.5 mm foci of DCIS high-grade, resection margins negative, ER 0%, PR 0%, HER-2 negative, Ki-67 20% on the final path, 0/1 lymph node negative 3. Adjuvant radiation therapy 01/13/2020-02/08/2020 --------------------------------------------------------------------------------------------------------------------------------------------- 1.  Hospitalization 91/69/4503-88/07/2799: Diastolic CHF and asthma exacerbation with hypoxia (prednisone taper and Lasix) 2.  Hospitalization 10/30/2019-11/03/2019: Respiratory failure   Breast cancer surveillance: 1. 12/22/2021 breast exam: Benign 2. 12/09/2020: Benign  breast density category C   Return to clinic in 1 year for follow-up   No orders of the defined types were placed in this encounter.  The patient has a good understanding of the overall plan. she agrees with it. she will call with any problems that may develop before the next visit here.  Total time spent: 20 mins including face to face time and time spent for planning, charting and coordination of care  Rulon Eisenmenger, MD, MPH 12/22/2021  I, Thana Ates, am acting as scribe for Dr. Nicholas Lose.  I have reviewed the above documentation for accuracy and completeness, and I agree with the above.

## 2021-12-22 ENCOUNTER — Inpatient Hospital Stay: Payer: Medicare Other | Attending: Hematology and Oncology | Admitting: Hematology and Oncology

## 2021-12-22 ENCOUNTER — Other Ambulatory Visit: Payer: Self-pay | Admitting: Nurse Practitioner

## 2021-12-22 ENCOUNTER — Other Ambulatory Visit: Payer: Self-pay

## 2021-12-22 DIAGNOSIS — Z171 Estrogen receptor negative status [ER-]: Secondary | ICD-10-CM | POA: Diagnosis not present

## 2021-12-22 DIAGNOSIS — Z853 Personal history of malignant neoplasm of breast: Secondary | ICD-10-CM | POA: Diagnosis not present

## 2021-12-22 DIAGNOSIS — Z7951 Long term (current) use of inhaled steroids: Secondary | ICD-10-CM | POA: Insufficient documentation

## 2021-12-22 DIAGNOSIS — Z7982 Long term (current) use of aspirin: Secondary | ICD-10-CM | POA: Diagnosis not present

## 2021-12-22 DIAGNOSIS — Z79899 Other long term (current) drug therapy: Secondary | ICD-10-CM | POA: Diagnosis not present

## 2021-12-22 DIAGNOSIS — Z7984 Long term (current) use of oral hypoglycemic drugs: Secondary | ICD-10-CM | POA: Diagnosis not present

## 2021-12-22 DIAGNOSIS — C50412 Malignant neoplasm of upper-outer quadrant of left female breast: Secondary | ICD-10-CM | POA: Diagnosis not present

## 2021-12-22 DIAGNOSIS — Z9221 Personal history of antineoplastic chemotherapy: Secondary | ICD-10-CM | POA: Insufficient documentation

## 2021-12-22 DIAGNOSIS — Z923 Personal history of irradiation: Secondary | ICD-10-CM | POA: Insufficient documentation

## 2021-12-25 ENCOUNTER — Other Ambulatory Visit: Payer: Self-pay | Admitting: Family Medicine

## 2021-12-25 ENCOUNTER — Telehealth: Payer: Self-pay | Admitting: Internal Medicine

## 2021-12-25 DIAGNOSIS — Z9889 Other specified postprocedural states: Secondary | ICD-10-CM

## 2021-12-25 NOTE — Telephone Encounter (Signed)
Patient called stating Medicare does not want to pay for her name brand Nexium because she takes it twice a day instead of once.  She said she goes through this about every two years or so and we write a letter for her.  Can you please take care of this for her please?  Thank you.

## 2021-12-27 DIAGNOSIS — Z79891 Long term (current) use of opiate analgesic: Secondary | ICD-10-CM | POA: Diagnosis not present

## 2021-12-27 DIAGNOSIS — M47816 Spondylosis without myelopathy or radiculopathy, lumbar region: Secondary | ICD-10-CM | POA: Diagnosis not present

## 2021-12-27 DIAGNOSIS — G893 Neoplasm related pain (acute) (chronic): Secondary | ICD-10-CM | POA: Diagnosis not present

## 2021-12-27 DIAGNOSIS — G894 Chronic pain syndrome: Secondary | ICD-10-CM | POA: Diagnosis not present

## 2021-12-28 NOTE — Telephone Encounter (Signed)
Submitted a PA to cover my meds and got the response that it already had an approval on file good through 12/02/2022 and did not need one.  Sent patient a mychart message to let her know.

## 2021-12-31 ENCOUNTER — Other Ambulatory Visit: Payer: Self-pay | Admitting: Family Medicine

## 2021-12-31 DIAGNOSIS — F339 Major depressive disorder, recurrent, unspecified: Secondary | ICD-10-CM

## 2022-01-01 ENCOUNTER — Telehealth: Payer: Self-pay | Admitting: Neurology

## 2022-01-01 NOTE — Telephone Encounter (Signed)
She will need to be assessed in the office, please have her schedule follow-up visit.

## 2022-01-01 NOTE — Telephone Encounter (Signed)
Called patient and informed her that she will need to be assessed in office. Informed patient I would have someone from the front give her a call to get her scheduled. Patient verbalized understanding and had no further questions or concerns.

## 2022-01-01 NOTE — Telephone Encounter (Signed)
Called patient and she informed me that she is still having dizziness after her fall. She states everytime she does a quick movement with her head or if she looks down the room starts spinning. Patient also stated that she has pain on the left side of her head down to her shoulders.   I informed patient that based off what she has informed me of she should go to Urgent care to be evaluated. Patient stated she already went to Urgent Care and they referred her to Orthopedics, Dr. Ricard Dillon.  I asked patient who her previous orthopaedic was so that we can request notes and patient stated she does not know she will have to look and let us know.   Patient is aware that I will send Dr. Posey Pronto her message and give her a call back once I here back.

## 2022-01-01 NOTE — Telephone Encounter (Signed)
What are her neurological symptoms?  If she is having pain, she may want to see urgent care as it would be quicker for evaluation.  Can you also find out which orthopeadic office, so we can request notes.  OK to schedule next available appointment.  Thanks.

## 2022-01-01 NOTE — Telephone Encounter (Signed)
Patient left a message with AN, she fell down some stairs and hit her head on sink. She went to ortho but they told her she needed to be seen with a neurologist. I called her and LM to call us back.

## 2022-01-02 NOTE — Telephone Encounter (Signed)
Called patient and left a voice mail to call back for scheduling.

## 2022-01-08 ENCOUNTER — Encounter: Payer: Self-pay | Admitting: Neurology

## 2022-01-08 ENCOUNTER — Ambulatory Visit: Payer: Medicare Other | Admitting: Neurology

## 2022-01-08 ENCOUNTER — Other Ambulatory Visit: Payer: Self-pay

## 2022-01-08 VITALS — BP 108/65 | HR 95 | Ht 66.0 in | Wt 221.0 lb

## 2022-01-08 DIAGNOSIS — G4486 Cervicogenic headache: Secondary | ICD-10-CM | POA: Diagnosis not present

## 2022-01-08 NOTE — Patient Instructions (Addendum)
Refer to out-patient physical therapy

## 2022-01-08 NOTE — Progress Notes (Signed)
Follow-up Visit   Date: 01/08/22   Shelby Anderson MRN: [id number] DOB: 1956/04/29   Interim History: Shelby Anderson is a 66 y.o. right-handed Caucasian female with asthma, pulmonary hypertension, atrial fibrillation, diabetes mellitus, depression/anxiety, hyperlipidemia, BPPV and left breast cancer (2020) returning to the clinic for follow-up of dizziness following fall.  The patient was accompanied to the clinic by self.  She suffered a fall down steps from her kitchen to laundry area during the summer of 2022 and hit her head on the sink cabinet. There was no loss of consciousness. Since this time, she has sharp pain over the left side of her head and neck with associated dizziness. Dizziness is worse with neck flexion or side to side head movements.  She complains of shooting pain over the left side of the head which occurs about 3-4 times per week.  She has not tried PT.  She sees pain management for low back pain and takes tizanidine 41m 1.5 tablets three times daily, Lyrica 1534mTID, oxycodone 1511m-3 times daily.   Medications:  Current Outpatient Medications on File Prior to Visit  Medication Sig Dispense Refill   albuterol (PROVENTIL) (2.5 MG/3ML) 0.083% nebulizer solution Take 3 mLs (2.5 mg total) by nebulization every 6 (six) hours as needed for wheezing or shortness of breath (J45.40). 75 mL 3   albuterol (VENTOLIN HFA) 108 (90 Base) MCG/ACT inhaler Inhale 2 puffs into the lungs every 6 (six) hours as needed for wheezing or shortness of breath. 3 g 3   aspirin EC 81 MG tablet Take 81 mg at bedtime by mouth.      atorvastatin (LIPITOR) 40 MG tablet TAKE 1 TABLET BY MOUTH EVERYDAY AT BEDTIME. 90 tablet 1   Blood Glucose Monitoring Suppl (ONE TOUCH ULTRA 2) w/Device KIT USE TO CHECK BLOOD SUGAR 2 TIMES A DAY 1 kit 0   budesonide-formoterol (SYMBICORT) 80-4.5 MCG/ACT inhaler INHALE 2 PUFFS INTO THE LUNGS EVERY MORNING AND ANOTHER 2 PUFFS 12 HOURS LATER 30.6 Inhaler 1    buPROPion (WELLBUTRIN XL) 150 MG 24 hr tablet TAKE 1 TABLET BY MOUTH EVERY DAY 90 tablet 0   Calcium Carbonate-Vitamin D 600-200 MG-UNIT TABS Take 1 tablet by mouth daily.     CINNAMON PO Take 1,000 mg 2 (two) times daily by mouth.     doxepin (SINEQUAN) 10 MG capsule Take 30 mg by mouth at bedtime.     famotidine (PEPCID) 20 MG tablet Take 1 tablet (20 mg total) by mouth 2 (two) times daily as needed for heartburn or indigestion. 180 tablet 1   fluticasone (FLONASE) 50 MCG/ACT nasal spray Place 2 sprays at bedtime as needed into both nostrils for allergies or rhinitis.      furosemide (LASIX) 20 MG tablet Take 1 tablet (20 mg total) by mouth daily as needed for fluid. 90 tablet 3   glipiZIDE (GLUCOTROL XL) 5 MG 24 hr tablet TAKE 1 TABLET BY MOUTH EVERYDAY WITH BREAKFAST. **DUE FOR YEARLY PHYSICAL** 90 tablet 1   hydrALAZINE (APRESOLINE) 25 MG tablet Take 1 tablet (25 mg total) by mouth daily as needed (for BP >140/90 mmHg, up to three times daily). 30 tablet 3   L-Methylfolate (DEPLIN) 7.5 MG TABS Take 7.5 mg by mouth daily with breakfast.     Lancets (ONETOUCH DELICA PLUS [id number] USE TO CHECK BLOOD SUGAR 2 TIMES A DAY 100 each 1   Lancets (ONETOUCH DELICA PLUS [id number] Use as directed 100 each 1  levothyroxine (SYNTHROID) 50 MCG tablet TAKE 1 TABLET BY MOUTH EVERY DAY 90 tablet 1   LYRICA 150 MG capsule Take 150 mg by mouth 3 (three) times daily.   2   metFORMIN (GLUCOPHAGE) 500 MG tablet TAKE 1 TABLET BY MOUTH TWICE A DAY WITH MEALS 180 tablet 1   metoprolol succinate (TOPROL-XL) 25 MG 24 hr tablet TAKE 1 TABLET BY MOUTH DAILY. TAKE WITH OR IMMEDIATELY FOLLOWING A MEAL. 90 tablet 3   Misc Natural Products (GLUCOS-CHONDROIT-MSM COMPLEX PO) Take 2 tablets by mouth daily.     mometasone (ELOCON) 0.1 % cream Apply topically.     montelukast (SINGULAIR) 10 MG tablet TAKE 1 TABLET BY MOUTH EVERYDAY AT BEDTIME 90 tablet 1   Multiple Vitamin (MULITIVITAMIN WITH MINERALS) TABS Take 1  tablet by mouth daily.     mupirocin cream (BACTROBAN) 2 % Apply once daily to lesions on feet as needed. 30 g 1   NEXIUM 40 MG capsule TAKE 1 CAPSULE BY MOUTH TWICE A DAY 180 capsule 1   nitroGLYCERIN (NITROSTAT) 0.4 MG SL tablet Place 1 tablet (0.4 mg total) under the tongue every 5 (five) minutes as needed for chest pain. 10 tablet 2   ondansetron (ZOFRAN) 4 MG tablet Take 1 tablet (4 mg total) by mouth every 8 (eight) hours as needed for nausea or vomiting. 80 tablet 5   ONETOUCH ULTRA test strip USE TO CHECK BLOOD SUGAR 2 TIMES A DAY 100 strip 3   oxyCODONE (ROXICODONE) 15 MG immediate release tablet Take 15 mg by mouth 5 (five) times daily.     potassium chloride (KLOR-CON) 10 MEQ tablet TAKE 2 TABLETS BY MOUTH EVERY DAY 180 tablet 0   promethazine (PHENERGAN) 25 MG tablet TAKE 1 TABLET BY MOUTH EVERYDAY AT BEDTIME 90 tablet 1   Spacer/Aero-Holding Chambers DEVI 1 Device by Does not apply route daily as needed. 1 Device 0   sucralfate (CARAFATE) 1 g tablet DISSOLVE 1 TAB IN 1 TABLESPOON OF DISTILLED WATER FOR 15 MINS. TAKE 4XDAILY WITH MEALS & AT BEDTIME 360 tablet 3   tiZANidine (ZANAFLEX) 4 MG tablet Take 4 mg by mouth every 8 (eight) hours as needed for muscle spasms.     vitamin B-12 (CYANOCOBALAMIN) 500 MCG tablet Take 500 mcg by mouth daily.      vitamin E 200 UNIT capsule Take 200 Units by mouth daily.     No current facility-administered medications on file prior to visit.    Allergies:  Allergies  Allergen Reactions   Lodine [Etodolac] Anaphylaxis, Hives and Swelling   Oxycontin [Oxycodone Hcl] Anaphylaxis    hives, trouble breathing, tongue swelling (Only Oxycontin) Tolerates plain oxycodone.   Penicillins Anaphylaxis    Told by a surgeon never to take it again. Has patient had a PCN reaction causing immediate rash, facial/tongue/throat swelling, SOB or lightheadedness with hypotension: Yes Has patient had a PCN reaction causing severe rash involving mucus membranes or skin  necrosis: Unknown Has patient had a PCN reaction that required hospitalization: No Has patient had a PCN reaction occurring within the last 10 years: No If all of the above answers are "NO", then may proceed with Cephalosporin use.   Aspirin Other (See Comments)    High-dose caused GI Bleeds   Darvocet [Propoxyphene N-Acetaminophen] Hives   Nitroglycerin Other (See Comments)    IV-BP drops dramatically Can take po   Tramadol Hives and Itching   Ultram [Tramadol Hcl] Hives   Valium Other (See Comments)    Circulation  problems. "Legs turned black".    Vital Signs:  BP 108/65    Pulse 95    Ht 5' 6" (1.676 m)    Wt 221 lb (100.2 kg)    SpO2 99%    BMI 35.67 kg/m   Neurological Exam: MENTAL STATUS including orientation to time, place, person, recent and remote memory, attention span and concentration, language, and fund of knowledge is normal.  Speech is not dysarthric.  CRANIAL NERVES:  No visual field defects.  Pupils equal round and reactive to light.  Normal conjugate, extra-ocular eye movements in all directions of gaze.  No ptosis.  No nystagmus. Face is symmetric. Palate elevates symmetrically.  Tongue is midline.  Very poor dentition.  MOTOR:  Motor strength is 5/5 in all extremities.  No atrophy, fasciculations or abnormal movements.  No pronator drift.  Tone is normal.    MSRs:  Reflexes are 2+/4 throughout.  SENSORY:  Intact to vibration throughout.  COORDINATION/GAIT:  Normal finger-to- nose-finger.  Intact rapid alternating movements bilaterally.  Gait appears nonphysiologic at times, unassisted.   Data: MRI cervical spine 03/20/2021: 1. 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 Finding could serve as a source of patient's symptoms. 2. Left paracentral disc osteophyte complex at C5-6 with resultant mild canal and moderate left C6 foraminal stenosis. 3. Moderate left-sided facet hypertrophy at C3-4 with resultant mild left C4  foraminal stenosis. 4. 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  MRI brain wo contrast 02/04/2020: 1. No acute intracranial abnormality or evidence of metastatic disease. 2. Progressive, moderate chronic small vessel ischemic disease. 3. 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    IMPRESSION/PLAN: Cervicogenic headaches and dizziness.   I tried to explain to patient the pathophysiology behind her symptoms and why it is not caused by an intracranial or radicular process and why management is focused at cervical muscle stretching and muscle relaxants, the latter which she is already taking.  Patient was not in agreement with what I had to offer at which time I asked her what she wanted - MRI brain, MRI cervical spine, migraine medication?  She is already on Lyrica 115m TID, tizanidine 615mTID and oxycodone 1534m-3 times per day which is written by pain management.  I offered to start her on daily preventative medication, such as topiramate or nortriptyline, but my overall suspicion that she has migraines is very low. She does not want to take additional medication. Her exam is nonfocal making intracranial pathology very unlikely and prior MRI brain from 2021 was nondiagnostic. MRI cervical spine from April 2022 shows left C6 foraminal stenosis which would not explain head pain.  She agreed to consider PT, so [facility] to apply for [facility] assistance. If symptoms do not improve with PT, imaging can be ordered.    Thank you for allowing me to participate in patient's care.  If I can answer any additional questions, I would be pleased to do so.    Sincerely,    Hanish Laraia K. PatPosey ProntoO

## 2022-01-12 ENCOUNTER — Telehealth (INDEPENDENT_AMBULATORY_CARE_PROVIDER_SITE_OTHER): Payer: Medicare Other | Admitting: Family Medicine

## 2022-01-12 DIAGNOSIS — K219 Gastro-esophageal reflux disease without esophagitis: Secondary | ICD-10-CM | POA: Diagnosis not present

## 2022-01-12 DIAGNOSIS — I2510 Atherosclerotic heart disease of native coronary artery without angina pectoris: Secondary | ICD-10-CM

## 2022-01-12 DIAGNOSIS — E038 Other specified hypothyroidism: Secondary | ICD-10-CM

## 2022-01-12 DIAGNOSIS — J4530 Mild persistent asthma, uncomplicated: Secondary | ICD-10-CM | POA: Diagnosis not present

## 2022-01-12 DIAGNOSIS — E785 Hyperlipidemia, unspecified: Secondary | ICD-10-CM

## 2022-01-12 DIAGNOSIS — I951 Orthostatic hypotension: Secondary | ICD-10-CM | POA: Diagnosis not present

## 2022-01-12 DIAGNOSIS — E1169 Type 2 diabetes mellitus with other specified complication: Secondary | ICD-10-CM

## 2022-01-12 DIAGNOSIS — G629 Polyneuropathy, unspecified: Secondary | ICD-10-CM | POA: Diagnosis not present

## 2022-01-12 DIAGNOSIS — I152 Hypertension secondary to endocrine disorders: Secondary | ICD-10-CM | POA: Diagnosis not present

## 2022-01-12 DIAGNOSIS — U071 COVID-19: Secondary | ICD-10-CM | POA: Diagnosis not present

## 2022-01-12 DIAGNOSIS — E119 Type 2 diabetes mellitus without complications: Secondary | ICD-10-CM | POA: Diagnosis not present

## 2022-01-12 DIAGNOSIS — E1159 Type 2 diabetes mellitus with other circulatory complications: Secondary | ICD-10-CM | POA: Diagnosis not present

## 2022-01-12 MED ORDER — MOLNUPIRAVIR EUA 200MG CAPSULE: 4.0000 | ORAL_CAPSULE | Freq: Two times a day (BID) | ORAL | 0 refills | Status: AC

## 2022-01-12 NOTE — Progress Notes (Signed)
Virtual Visit via Video Note - patient unable to connect via video, so visit completed by telephone.  I connected with Shelby Anderson  on 01/12/22 at  4:00 PM EST by a video enabled telemedicine application and verified that I am speaking with the correct person using two identifiers.  Location patient: home Location provider: [place], [place] [address] Persons participating in the virtual visit: patient, provider  I discussed the limitations of evaluation and management by telemedicine and the availability of in person appointments. The patient expressed understanding and agreed to proceed.   LATRESHIA Anderson DOB: Jul 16, 1956 Encounter date: 01/12/2022  This is a 66 y.o. female who presents for chronic follow up.     History of present illness: Last visit was 11 months ago for complete physical.  Right now sneezing, congestion, cough. Just started last night. No fever. No exposure. She has had covid exposure; just found out yesterday. Her son and son in law both are positive. She has had exposures to them this week. Head feels bad. No shortness of breath.   Hypertension: Follows with cardiology. Asthma: Symbicort 80 mcg 2 puffs twice daily. Doing well with this. Hasn't needed albuterol inhaler at all.  Sleep apnea: using cpap nightly.  Diabetes: Follows with podiatry for onychomycosis.  Last visit 05/2021. Sugars have been going up and down. Highest 146.usually in 120's. Taking glipizide 5mg  daily xl, metformin 500mg  BID. Tolerates well.  Hyperlipidemia: Lipitor 40 mg daily Depression: Wellbutrin 150 mg XL daily, doxepin 30 mg at bedtime Acid reflux: Last visit with GI was June/2022.  Pepcid 20 mg twice daily.  Was on Carafate and Zofran and Nexium as well for heartburn and nausea per GI. She feels like she is doing better overall. Carafate made her sick so not taking that. She takes the zofran occasionally. Nexium 40mg  daily. Pepcid 20mg   bid. Unable to tolerate backing off nexium -issues come right back.  Hypothyroid: synthroid 60mcg daily.   Mood has been doing good.   Allergies  Allergen Reactions   Lodine [Etodolac] Anaphylaxis, Hives and Swelling   Oxycontin [Oxycodone Hcl] Anaphylaxis    hives, trouble breathing, tongue swelling (Only Oxycontin) Tolerates plain oxycodone.   Penicillins Anaphylaxis    Told by a surgeon never to take it again. Has patient had a PCN reaction causing immediate rash, facial/tongue/throat swelling, SOB or lightheadedness with hypotension: Yes Has patient had a PCN reaction causing severe rash involving mucus membranes or skin necrosis: Unknown Has patient had a PCN reaction that required hospitalization: No Has patient had a PCN reaction occurring within the last 10 years: No If all of the above answers are "NO", then may proceed with Cephalosporin use.   Aspirin Other (See Comments)    High-dose caused GI Bleeds   Darvocet [Propoxyphene N-Acetaminophen] Hives   Nitroglycerin Other (See Comments)    IV-BP drops dramatically Can take po   Tramadol Hives and Itching   Ultram [Tramadol Hcl] Hives   Valium Other (See Comments)    Circulation problems. "Legs turned black".   Current Meds  Medication Sig   molnupiravir EUA (LAGEVRIO) 200 mg CAPS capsule Take 4 capsules (800 mg total) by mouth 2 (two) times daily for 5 days.    Review of Systems  Constitutional:  Positive for fatigue. Negative for chills and fever.  HENT:  Positive for congestion, postnasal drip, sinus pressure, sinus pain and sore throat. Negative for ear pain.   Respiratory:  Positive  for cough. Negative for shortness of breath and wheezing.   Cardiovascular:  Negative for chest pain.  Neurological:  Positive for headaches.   Objective:  There were no vitals taken for this visit.      BP Readings from Last 3 Encounters:  01/08/22 108/65  12/22/21 (!) 102/59  09/12/21 108/78   Wt Readings from Last 3  Encounters:  01/08/22 221 lb (100.2 kg)  12/22/21 221 lb 9.6 oz (100.5 kg)  09/12/21 212 lb (96.2 kg)    EXAM:  GENERAL: sounds alert, oriented. HEENT: sounds congested, hoarse. LUNGS:  no obvious gross SOB. She does cough occasionally during discussion. PSYCH/NEURO: pleasant and cooperative, no obvious depression or anxiety, speech and thought processing grossly intact   Assessment/Plan  1. Hypertension associated with diabetes ([place]) Continue with current medication. Next visit should be in person to confirm that bp well controlled.  - CBC with Differential/Platelet; Future - Comprehensive metabolic panel; Future  2. 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    4. Mild persistent asthma without complication Advised that she is ok to increase symbicort while sick (4 puffs bid)  5. Gastroesophageal reflux disease without esophagitis She continues with nexium, pepcid. She has reflux sx if she cuts back.   6. Type 2 diabetes mellitus without complication, without long-term current use of insulin (HCC) Continue with glipizide 5mg , metformin 500mg bid. Recheck labs.  - Hemoglobin A1c; Future - Microalbumin / creatinine urine ratio; Future  7. Hyperlipidemia associated with type 2 diabetes mellitus ([facility]) Lipitor. Recheck bloodwork when feeling better.  - Lipid panel; Future  8. COVID-19 Patient with known COVID exposure x 2 in last week and onset of sx. She has no covid test at home and since it is Friday I worry about her chronic health conditions contributing to worse sx of covid without treatment. We discussed treatment and I will send this in today for her. If able, encouraged her to get a home test to verify +. - molnupiravir EUA (LAGEVRIO) 200 mg CAPS capsule; Take 4 capsules (800 mg total) by mouth 2 (two) times daily for 5 days.  Dispense: 40 capsule; Refill: 0  10. Other  specified hypothyroidism Continue with synthroid 64mcg daily. We will recheck labs when feeling better. - TSH; Future    I discussed the assessment and treatment plan with the patient. The patient was provided an opportunity to ask questions and all were answered. The patient agreed with the plan and demonstrated an understanding of the instructions.   The patient was advised to call back or seek an in-person evaluation if the symptoms worsen or if the condition fails to improve as anticipated.  I provided 25 minutes of non face-to-face time during this encounter.   [name], MD

## 2022-01-22 ENCOUNTER — Other Ambulatory Visit: Payer: Self-pay | Admitting: Family Medicine

## 2022-01-22 ENCOUNTER — Ambulatory Visit
Admission: RE | Admit: 2022-01-22 | Discharge: 2022-01-22 | Disposition: A | Discharge status: Home / Self Care | Payer: Medicare Other | Source: Ambulatory Visit | Attending: Family Medicine | Admitting: Family Medicine

## 2022-01-22 ENCOUNTER — Other Ambulatory Visit: Payer: Self-pay | Admitting: Internal Medicine

## 2022-01-22 DIAGNOSIS — R922 Inconclusive mammogram: Secondary | ICD-10-CM | POA: Diagnosis not present

## 2022-01-22 DIAGNOSIS — Z9889 Other specified postprocedural states: Secondary | ICD-10-CM

## 2022-01-25 ENCOUNTER — Other Ambulatory Visit (INDEPENDENT_AMBULATORY_CARE_PROVIDER_SITE_OTHER): Payer: Medicare Other

## 2022-01-25 ENCOUNTER — Telehealth: Payer: Self-pay | Admitting: *Deleted

## 2022-01-25 DIAGNOSIS — E1169 Type 2 diabetes mellitus with other specified complication: Secondary | ICD-10-CM | POA: Diagnosis not present

## 2022-01-25 DIAGNOSIS — E1159 Type 2 diabetes mellitus with other circulatory complications: Secondary | ICD-10-CM

## 2022-01-25 DIAGNOSIS — E038 Other specified hypothyroidism: Secondary | ICD-10-CM | POA: Diagnosis not present

## 2022-01-25 DIAGNOSIS — E119 Type 2 diabetes mellitus without complications: Secondary | ICD-10-CM

## 2022-01-25 DIAGNOSIS — E785 Hyperlipidemia, unspecified: Secondary | ICD-10-CM

## 2022-01-25 DIAGNOSIS — Z79891 Long term (current) use of opiate analgesic: Secondary | ICD-10-CM | POA: Diagnosis not present

## 2022-01-25 DIAGNOSIS — G893 Neoplasm related pain (acute) (chronic): Secondary | ICD-10-CM | POA: Diagnosis not present

## 2022-01-25 DIAGNOSIS — I152 Hypertension secondary to endocrine disorders: Secondary | ICD-10-CM

## 2022-01-25 DIAGNOSIS — G894 Chronic pain syndrome: Secondary | ICD-10-CM | POA: Diagnosis not present

## 2022-01-25 DIAGNOSIS — M47816 Spondylosis without myelopathy or radiculopathy, lumbar region: Secondary | ICD-10-CM | POA: Diagnosis not present

## 2022-01-25 LAB — CBC WITH DIFFERENTIAL/PLATELET
Basophils Absolute: 0.1 10*3/uL (ref 0.0–0.1)
Basophils Relative: 1 % (ref 0.0–3.0)
Eosinophils Absolute: 0.2 10*3/uL (ref 0.0–0.7)
Eosinophils Relative: 2.3 % (ref 0.0–5.0)
HCT: 40.3 % (ref 36.0–46.0)
Hemoglobin: 13.3 g/dL (ref 12.0–15.0)
Lymphocytes Relative: 16.8 % (ref 12.0–46.0)
Lymphs Abs: 1.3 10*3/uL (ref 0.7–4.0)
MCHC: 32.9 g/dL (ref 30.0–36.0)
MCV: 88.3 fl (ref 78.0–100.0)
Monocytes Absolute: 0.4 10*3/uL (ref 0.1–1.0)
Monocytes Relative: 5.2 % (ref 3.0–12.0)
Neutro Abs: 6 10*3/uL (ref 1.4–7.7)
Neutrophils Relative %: 74.7 % (ref 43.0–77.0)
Platelets: 271 10*3/uL (ref 150.0–400.0)
RBC: 4.57 Mil/uL (ref 3.87–5.11)
RDW: 14.4 % (ref 11.5–15.5)
WBC: 8 10*3/uL (ref 4.0–10.5)

## 2022-01-25 LAB — COMPREHENSIVE METABOLIC PANEL
ALT: 30 U/L (ref 0–35)
AST: 25 U/L (ref 0–37)
Albumin: 4.2 g/dL (ref 3.5–5.2)
Alkaline Phosphatase: 76 U/L (ref 39–117)
BUN: 16 mg/dL (ref 6–23)
CO2: 31 mEq/L (ref 19–32)
Calcium: 9.5 mg/dL (ref 8.4–10.5)
Chloride: 101 mEq/L (ref 96–112)
Creatinine, Ser: 0.79 mg/dL (ref 0.40–1.20)
GFR: 79.36 mL/min (ref >60.00)
Glucose, Bld: 135 mg/dL — ABNORMAL HIGH (ref 70–99)
Potassium: 4.1 mEq/L (ref 3.5–5.1)
Sodium: 140 mEq/L (ref 135–145)
Total Bilirubin: 0.5 mg/dL (ref 0.2–1.2)
Total Protein: 7.3 g/dL (ref 6.0–8.3)

## 2022-01-25 LAB — LIPID PANEL
Cholesterol: 127 mg/dL (ref 0–200)
HDL: 35.8 mg/dL — ABNORMAL LOW (ref >39.00)
LDL Cholesterol: 62 mg/dL (ref 0–99)
NonHDL: 91.22
Total CHOL/HDL Ratio: 4
Triglycerides: 146 mg/dL (ref 0.0–149.0)
VLDL: 29.2 mg/dL (ref 0.0–40.0)

## 2022-01-25 LAB — HEMOGLOBIN A1C: Hgb A1c MFr Bld: 7.5 % — ABNORMAL HIGH (ref 4.6–6.5)

## 2022-01-25 LAB — TSH: TSH: 9.47 u[IU]/mL — ABNORMAL HIGH (ref 0.35–5.50)

## 2022-01-25 LAB — MICROALBUMIN / CREATININE URINE RATIO
Creatinine,U: 112.3 mg/dL
Microalb Creat Ratio: 6.9 mg/g (ref 0.0–30.0)
Microalb, Ur: 7.7 mg/dL — ABNORMAL HIGH (ref 0.0–1.9)

## 2022-01-25 NOTE — Chronic Care Management (AMB) (Signed)
°  Care Management   Note  01/25/2022 Name: MICHA ERCK MRN: [id number] DOB: 30-Aug-1956  [name] is a 66 y.o. year old female who is a primary care patient of Caren Macadam, MD and is actively engaged with the care management team. I reached out to [name] by phone today to assist with scheduling an initial visit with the RN Case Manager  Follow up plan: Telephone appointment with care management team member scheduled for:01/29/22  [place] Management  Direct Dial: [phone]

## 2022-01-26 ENCOUNTER — Telehealth: Payer: Self-pay | Admitting: Pharmacist

## 2022-01-26 NOTE — Chronic Care Management (AMB) (Signed)
° ° °  Chronic Care Management Pharmacy Assistant   Name: Shelby Anderson  MRN: [id number] DOB: September 27, 1956  Reason for Encounter: Schedule follow up visit with [name], patients last visit was 07/17/2021.  Rescheduled with patient to 05/28/2022.    [place] Pharmacist Assistant [phone]

## 2022-01-29 ENCOUNTER — Ambulatory Visit (INDEPENDENT_AMBULATORY_CARE_PROVIDER_SITE_OTHER): Payer: Medicare Other

## 2022-01-29 DIAGNOSIS — M545 Low back pain, unspecified: Secondary | ICD-10-CM

## 2022-01-29 DIAGNOSIS — I420 Dilated cardiomyopathy: Secondary | ICD-10-CM

## 2022-01-29 DIAGNOSIS — E1169 Type 2 diabetes mellitus with other specified complication: Secondary | ICD-10-CM

## 2022-01-29 DIAGNOSIS — E1159 Type 2 diabetes mellitus with other circulatory complications: Secondary | ICD-10-CM

## 2022-01-29 DIAGNOSIS — I2510 Atherosclerotic heart disease of native coronary artery without angina pectoris: Secondary | ICD-10-CM

## 2022-01-29 DIAGNOSIS — I152 Hypertension secondary to endocrine disorders: Secondary | ICD-10-CM

## 2022-01-29 DIAGNOSIS — E119 Type 2 diabetes mellitus without complications: Secondary | ICD-10-CM

## 2022-01-29 DIAGNOSIS — G8929 Other chronic pain: Secondary | ICD-10-CM

## 2022-01-29 DIAGNOSIS — J4530 Mild persistent asthma, uncomplicated: Secondary | ICD-10-CM

## 2022-01-29 DIAGNOSIS — E785 Hyperlipidemia, unspecified: Secondary | ICD-10-CM

## 2022-01-29 NOTE — Chronic Care Management (AMB) (Signed)
Chronic Care Management   CCM RN Visit Note  01/29/2022 Name: Shelby Anderson MRN: [id number] DOB: [birth year]  Subjective: Shelby Anderson is a 66 y.o. year old female who is a primary care patient of Koberlein, Steele Berg, MD. The care management team was consulted for assistance with disease management and care coordination needs.    Engaged with patient by telephone for initial visit in response to provider referral for case management and/or care coordination services.   Consent to Services:  The patient was given the following information about Chronic Care Management services today, agreed to services, and gave verbal consent: 1. CCM service includes personalized support from designated clinical staff supervised by the primary care provider, including individualized plan of care and coordination with other care providers 2. 24/7 contact phone numbers for assistance for urgent and routine care needs. 3. Service will only be billed when office clinical staff spend 20 minutes or more in a month to coordinate care. 4. Only one practitioner may furnish and bill the service in a calendar month. 5.The patient may stop CCM services at any time (effective at the end of the month) by phone call to the office staff. 6. The patient will be responsible for cost sharing (co-pay) of up to 20% of the service fee (after annual deductible is met). Patient agreed to services and consent obtained.  Patient agreed to services and verbal consent obtained.   Assessment: Review of patient past medical history, allergies, medications, health status, including review of consultants reports, laboratory and other test data, was performed as part of comprehensive evaluation and provision of chronic care management services.   SDOH (Social Determinants of Health) assessments and interventions performed:  SDOH Interventions    Flowsheet Row Most Recent Value  SDOH Interventions   Food Insecurity Interventions  Other (Comment)  [care guide referral for food resources]  Financial Strain Interventions Other (Comment)  [care guide referral]  Housing Interventions Intervention Not Indicated  Stress Interventions Intervention Not Indicated  Transportation Interventions Intervention Not Indicated        CCM Care Plan  Allergies  Allergen Reactions   Lodine [Etodolac] Anaphylaxis, Hives and Swelling   Oxycontin [Oxycodone Hcl] Anaphylaxis    hives, trouble breathing, tongue swelling (Only Oxycontin) Tolerates plain oxycodone.   Penicillins Anaphylaxis    Told by a surgeon never to take it again. Has patient had a PCN reaction causing immediate rash, facial/tongue/throat swelling, SOB or lightheadedness with hypotension: Yes Has patient had a PCN reaction causing severe rash involving mucus membranes or skin necrosis: Unknown Has patient had a PCN reaction that required hospitalization: No Has patient had a PCN reaction occurring within the last 10 years: No If all of the above answers are "NO", then may proceed with Cephalosporin use.   Aspirin Other (See Comments)    High-dose caused GI Bleeds   Darvocet [Propoxyphene N-Acetaminophen] Hives   Nitroglycerin Other (See Comments)    IV-BP drops dramatically Can take po   Tramadol Hives and Itching   Ultram [Tramadol Hcl] Hives   Valium Other (See Comments)    Circulation problems. "Legs turned black".    Outpatient Encounter Medications as of 01/29/2022  Medication Sig   albuterol (PROVENTIL) (2.5 MG/3ML) 0.083% nebulizer solution Take 3 mLs (2.5 mg total) by nebulization every 6 (six) hours as needed for wheezing or shortness of breath (J45.40).   albuterol (VENTOLIN HFA) 108 (90 Base) MCG/ACT inhaler Inhale 2 puffs into the lungs every 6 (six) hours as  for wheezing or shortness of breath.  ° aspirin EC 81 MG tablet Take 81 mg at bedtime by mouth.   ° atorvastatin (LIPITOR) 40 MG tablet TAKE 1 TABLET BY MOUTH EVERYDAY AT BEDTIME.  °  Blood Glucose Monitoring Suppl (ONE TOUCH ULTRA 2) w/Device KIT USE TO CHECK BLOOD SUGAR 2 TIMES A DAY  ° budesonide-formoterol (SYMBICORT) 80-4.5 MCG/ACT inhaler INHALE 2 PUFFS INTO THE LUNGS EVERY MORNING AND ANOTHER 2 PUFFS 12 HOURS LATER  ° buPROPion (WELLBUTRIN XL) 150 MG 24 hr tablet TAKE 1 TABLET BY MOUTH EVERY DAY  ° Calcium Carbonate-Vitamin D 600-200 MG-UNIT TABS Take 1 tablet by mouth daily.  ° CINNAMON PO Take 1,000 mg 2 (two) times daily by mouth.  ° doxepin (SINEQUAN) 10 MG capsule Take 30 mg by mouth at bedtime.  ° famotidine (PEPCID) 20 MG tablet Take 1 tablet (20 mg total) by mouth 2 (two) times daily as needed for heartburn or indigestion.  ° fluticasone (FLONASE) 50 MCG/ACT nasal spray Place 2 sprays at bedtime as needed into both nostrils for allergies or rhinitis.   ° furosemide (LASIX) 20 MG tablet TAKE 1 OR 2 TABLETS BY MOUTH EVERY DAY AS DIRECTED  ° glipiZIDE (GLUCOTROL XL) 5 MG 24 hr tablet TAKE 1 TABLET BY MOUTH EVERYDAY WITH BREAKFAST. **DUE FOR YEARLY PHYSICAL**  ° hydrALAZINE (APRESOLINE) 25 MG tablet Take 1 tablet (25 mg total) by mouth daily as needed (for BP >140/90 mmHg, up to three times daily).  ° L-Methylfolate (DEPLIN) 7.5 MG TABS Take 7.5 mg by mouth daily with breakfast.  ° Lancets (ONETOUCH DELICA PLUS LANCET30G) MISC USE TO CHECK BLOOD SUGAR 2 TIMES A DAY  ° Lancets (ONETOUCH DELICA PLUS LANCET30G) MISC Use as directed  ° levothyroxine (SYNTHROID) 50 MCG tablet TAKE 1 TABLET BY MOUTH EVERY DAY  ° LYRICA 150 MG capsule Take 150 mg by mouth 3 (three) times daily.   ° metFORMIN (GLUCOPHAGE) 500 MG tablet TAKE 1 TABLET BY MOUTH TWICE A DAY WITH MEALS  ° metoprolol succinate (TOPROL-XL) 25 MG 24 hr tablet TAKE 1 TABLET BY MOUTH DAILY. TAKE WITH OR IMMEDIATELY FOLLOWING A MEAL.  ° Misc Natural Products (GLUCOS-CHONDROIT-MSM COMPLEX PO) Take 2 tablets by mouth daily.  ° mometasone (ELOCON) 0.1 % cream Apply topically.  ° montelukast (SINGULAIR) 10 MG tablet TAKE 1 TABLET BY MOUTH  EVERYDAY AT BEDTIME  ° Multiple Vitamin (MULITIVITAMIN WITH MINERALS) TABS Take 1 tablet by mouth daily.  ° mupirocin cream (BACTROBAN) 2 % Apply once daily to lesions on feet as needed.  ° NEXIUM 40 MG capsule TAKE 1 CAPSULE BY MOUTH TWICE A DAY  ° nitroGLYCERIN (NITROSTAT) 0.4 MG SL tablet Place 1 tablet (0.4 mg total) under the tongue every 5 (five) minutes as needed for chest pain.  ° ondansetron (ZOFRAN) 4 MG tablet Take 1 tablet (4 mg total) by mouth every 8 (eight) hours as needed for nausea or vomiting.  ° ONETOUCH ULTRA test strip USE TO CHECK BLOOD SUGAR 2 TIMES A DAY  ° oxyCODONE (ROXICODONE) 15 MG immediate release tablet Take 15 mg by mouth 5 (five) times daily.  ° potassium chloride (KLOR-CON) 10 MEQ tablet TAKE 2 TABLETS BY MOUTH EVERY DAY  ° promethazine (PHENERGAN) 25 MG tablet TAKE 1 TABLET BY MOUTH EVERYDAY AT BEDTIME  ° Spacer/Aero-Holding Chambers DEVI 1 Device by Does not apply route daily as needed.  ° tiZANidine (ZANAFLEX) 4 MG tablet Take 4 mg by mouth every 8 (eight) hours as needed for muscle spasms.  °   vitamin B-12 (CYANOCOBALAMIN) 500 MCG tablet Take 500 mcg by mouth daily.    vitamin E 200 UNIT capsule Take 200 Units by mouth daily.   No facility-administered encounter medications on file as of 01/29/2022.    Patient Active Problem List   Diagnosis Date Noted   Spondylosis without myelopathy or radiculopathy, cervical region 04/21/2021   Acute on chronic diastolic CHF (congestive heart failure) ([place]) 10/01/2019   Neutropenia with fever ([place]) 10/01/2019   Genetic testing 09/17/2019   Family history of melanoma    Family history of pancreatic cancer    Malignant neoplasm of upper-outer quadrant of left breast in female, estrogen receptor negative ([place]) 09/07/2019   Encounter for loop recorder at end of battery life 04/24/2018   Esophageal stricture 07/01/2017   Hyperlipidemia associated with type 2 diabetes mellitus ([place]) 05/20/2017   Allergic rhinitis 03/11/2017    Dilated cardiomyopathy ([place]) 02/07/2017   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 11/13/2016   Morbid obesity due to excess calories ([place]) 09/26/2016   S/P left TKA 09/25/2016   Increased endometrial stripe thickness 06/27/2016   Intramural leiomyoma of uterus 06/27/2016   Type 2 diabetes mellitus without complication, without long-term current use of insulin ([place]) 12/06/2015   Hypertension associated with diabetes ([place]) 07/19/2015   Arrhythmia 07/19/2015   Orthostatic hypotension 07/19/2015   GERD (gastroesophageal reflux disease) 07/19/2015   Chronic back pain 07/19/2015   Neuropathy 07/19/2015   Symptomatic PVCs 11/02/2014   Syncope 11/02/2014   Chest pain 12/23/2013   Disorder of cervix 03/10/2013   Vaginal atrophy 03/10/2013   OSA (obstructive sleep apnea) 07/31/2012   Asthma 04/29/2012   Coronary artery disease 11/20/2011    Conditions to be addressed/monitored:CHF, CAD, HTN, HLD, DMII, Asthma, and chronic pain  Care Plan : RN Care Manager Plan of Care  Updates made by Dimitri Ped, RN since 01/29/2022 12:00 AM     Problem: Chronic Disease Management and Care Coordination Needs (DM2, CAD, CHF, Asthma,HTN, HLD, chronic pain)   Priority: High     Long-Range Goal: Establish Plan of Care for Chronic Disease Management Needs (DM2, CAD, CHF, Asthma,HTN, HLD, chronic pain)   Start Date: 01/29/2022  Expected End Date: 01/29/2023  Priority: High  Note:   Current Barriers:  Knowledge Deficits related to plan of care for management of CHF, CAD, HTN, HLD, DMII, Asthma, and chronic back pain  Care Coordination needs related to Financial constraints related to food and Limited access to food Chronic Disease Management support and education needs related to CHF, CAD, HTN, HLD, DMII, Asthma, and chronic back pain   Financial Constraints  States that she sometimes has difficulty affording food and especially healthy food  RNCM Clinical Goal(s):  Patient will verbalize  understanding of plan for management of CHF, CAD, HTN, HLD, DMII, Asthma, and chronic back pain  as evidenced by voiced adherence to plan of care verbalize basic understanding of  CHF, CAD, HTN, HLD, DMII, Asthma, and chronic back pain disease process and self health management plan as evidenced by voiced understanding and teach back take all medications exactly as prescribed and will call provider for medication related questions as evidenced by dispense report and pt verbalization  attend all scheduled medical appointments: Podiatry 03/07/22, Cardiology 03/14/22, CCM PharmD 05/28/22  as evidenced by medical records demonstrate Improved adherence to prescribed treatment plan for CHF, CAD, HTN, HLD, DMII, Asthma, and chronic back pain as evidenced by readings within limits, voiced adherence to plan of care continue to work  with RN Care Manager to address care management and care coordination needs related to  CHF, CAD, HTN, HLD, DMII, Asthma, and chronic back pain as evidenced by adherence to CM Team Scheduled appointments °work with pharmacist to address Financial constraints related to food and Limited access to food related toCHF, CAD, HTN, HLD, DMII, Asthma, and chronic back pain  as evidenced by review or EMR and patient or pharmacist report °work with community resource care guide to address needs related to  Financial constraints related to food and Limited access to food as evidenced by patient and/or community resource care guide support through collaboration with RN Care manager, provider, and care team.  ° °Interventions: °1:1 collaboration with primary care provider regarding development and update of comprehensive plan of care as evidenced by provider attestation and co-signature °Inter-disciplinary care team collaboration (see longitudinal plan of care) °Evaluation of current treatment plan related to  self management and patient's adherence to plan as established by provider ° ° °Asthma: (Status:New  goal.) Long Term Goal °Provided patient with basic written and verbal Asthma education on self care/management/and exacerbation prevention °Advised patient to track and manage Asthma triggers °Provided instruction about proper use of medications used for management of Asthma including inhalers °Advised patient to engage in light exercise as tolerated 3-5 days a week to aid in the the management of Asthma ° ° °CAD Interventions: (Status:  New goal.) Long Term Goal °Assessed understanding of CAD diagnosis °Medications reviewed including medications utilized in CAD treatment plan °Provided education on importance of blood pressure control in management of CAD °Provided education on Importance of limiting foods high in cholesterol °Counseled on the importance of exercise goals with target of 150 minutes per week °Reviewed Importance of taking all medications as prescribed °Reviewed Importance of attending all scheduled provider appointments °Advised to report any changes in symptoms or exercise tolerance ° ° °Heart Failure Interventions:  (Status:  New goal.) Long Term Goal °Basic overview and discussion of pathophysiology of Heart Failure reviewed °Provided education on low sodium diet °Reviewed Heart Failure Action Plan in depth and provided written copy °Assessed need for readable accurate scales in home °Discussed importance of daily weight and advised patient to weigh and record daily °Reviewed role of diuretics in prevention of fluid overload and management of heart failure; °Discussed the importance of keeping all appointments with provider ° °Diabetes Interventions:  (Status:  New goal.) Long Term Goal °Assessed patient's understanding of A1c goal: <7% °Provided education to patient about basic DM disease process °Reviewed medications with patient and discussed importance of medication adherence °Provided patient with written educational materials related to hypo and hyperglycemia and importance of correct  treatment °Advised patient, providing education and rationale, to check cbg twice a day and record, calling provider for findings outside established parameters °Referral made to pharmacy team for assistance with cost, next CCM Pharm D appt 05/28/22 °Referral made to community resources care guide team for assistance with food resources and finances  °Review of patient status, including review of consultants reports, relevant laboratory and other test results, and medications completed °Screening for signs and symptoms of depression related to chronic disease state  °Assessed social determinant of health barriers °Lab Results  °Component Value Date  ° HGBA1C 7.5 (H) 01/25/2022  ° °Hyperlipidemia Interventions:  (Status:  New goal.) Long Term Goal °Medication review performed; medication list updated in electronic medical record.  °Provider established cholesterol goals reviewed °Counseled on importance of regular laboratory monitoring as prescribed °Reviewed role and benefits   of statin for ASCVD risk reduction Reviewed importance of limiting foods high in cholesterol Reviewed exercise goals and target of 150 minutes per week  Hypertension Interventions:  (Status:  New goal.) Long Term Goal Last practice recorded BP readings:  BP Readings from Last 3 Encounters:  01/08/22 108/65  12/22/21 (!) 102/59  09/12/21 108/78  Most recent eGFR/CrCl: No results found for: EGFR  No components found for: CRCL  Evaluation of current treatment plan related to hypertension self management and patient's adherence to plan as established by provider Provided education to patient re: stroke prevention, s/s of heart attack and stroke Counseled on the importance of exercise goals with target of 150 minutes per week Discussed plans with patient for ongoing care management follow up and provided patient with direct contact information for care management team Advised patient, providing education and rationale, to monitor  blood pressure daily and record, calling PCP for findings outside established parameters Provided education on prescribed diet low sodium low CHO heart healthy  Pain Interventions:  (Status:  New goal.) Long Term Goal Pain assessment performed Medications reviewed Reviewed provider established plan for pain management Discussed importance of adherence to all scheduled medical appointments Counseled on the importance of reporting any/all new or changed pain symptoms or management strategies to pain management provider Advised patient to report to care team affect of pain on daily activities Discussed use of relaxation techniques and/or diversional activities to assist with pain reduction (distraction, imagery, relaxation, massage, acupressure, TENS, heat, and cold application Reviewed with patient prescribed pharmacological and nonpharmacological pain relief strategies  Patient Goals/Self-Care Activities: Take all medications as prescribed Attend all scheduled provider appointments Call pharmacy for medication refills 3-7 days in advance of running out of medications Call provider office for new concerns or questions  call office if I gain more than 2 pounds in one day or 5 pounds in one week watch for swelling in feet, ankles and legs every day weigh myself daily develop a rescue plan eat more whole grains, fruits and vegetables, lean meats and healthy fats keep appointment with eye doctor check blood sugar at prescribed times: twice daily and when you have symptoms of low or high blood sugar take the blood sugar log to all doctor visits fill half of plate with vegetables manage portion size wash and dry feet carefully every day check blood pressure daily choose a place to take my blood pressure (home, clinic or office, retail store) write blood pressure results in a log or diary take blood pressure log to all doctor appointments limit salt intake to 2354m/day call for medicine  refill 2 or 3 days before it runs out take all medications exactly as prescribed call doctor with any symptoms you believe are related to your medicine  Follow Up Plan:  Telephone follow up appointment with care management team member scheduled for:  02/26/22 The patient has been provided with contact information for the care management team and has been advised to call with any health related questions or concerns.       Plan:Telephone follow up appointment with care management team member scheduled for:  02/26/22 The patient has been provided with contact information for the care management team and has been advised to call with any health related questions or concerns.  MPeter GarterRN, BJackquline Denmark CDE Care Management Coordinator [facility] ([phone]

## 2022-01-29 NOTE — Patient Instructions (Signed)
Visit Information   Thank you for taking time to visit with me today. Please don't hesitate to contact me if I can be of assistance to you before our next scheduled telephone appointment.  Following are the goals we discussed today:  Take all medications as prescribed Attend all scheduled provider appointments Call pharmacy for medication refills 3-7 days in advance of running out of medications Call provider office for new concerns or questions  call office if I gain more than 2 pounds in one day or 5 pounds in one week watch for swelling in feet, ankles and legs every day weigh myself daily develop a rescue plan eat more whole grains, fruits and vegetables, lean meats and healthy fats keep appointment with eye doctor check blood sugar at prescribed times: twice daily and when you have symptoms of low or high blood sugar take the blood sugar log to all doctor visits fill half of plate with vegetables manage portion size wash and dry feet carefully every day check blood pressure daily choose a place to take my blood pressure (home, clinic or office, retail store) write blood pressure results in a log or diary take blood pressure log to all doctor appointments limit salt intake to 2347m/day call for medicine refill 2 or 3 days before it runs out take all medications exactly as prescribed call doctor with any symptoms you believe are related to your medicine Type 2 Diabetes Mellitus, Self-Care, Adult When you have type 2 diabetes (type 2 diabetes mellitus), you must make sure your blood sugar (glucose) stays in a healthy range. You can do this with: Nutrition. Exercise. Lifestyle changes. Medicines or insulin, if needed. Support from your doctors and others. What are the risks? Having type 2 diabetes can raise your risk for other long-term (chronic) health problems. You may get medicines to help prevent these problems. How to stay aware of your blood sugar  Check your blood  sugar level every day, as often as told. Have your A1C (hemoglobin A1C) level checked two or more times a year. Have it checked more often if told. Your doctor will set personal treatment goals for you. In general, you should have these blood sugar levels: Before meals: 80-130 mg/dL (4.4-7.2 mmol/L). After meals: below 180 mg/dL (10 mmol/L). A1C: less than 7%. How to manage high and low blood sugar Symptoms of high blood sugar High blood sugar is also called hyperglycemia. Know the symptoms of high blood sugar. These may include: More thirst. Hunger. Feeling very tired. Needing to pee (urinate) more often than normal. Seeing things blurry. Symptoms of low blood sugar Low blood sugar is also called hypoglycemia. This is when blood sugar is at or below 70 mg/dL (3.9 mmol/L). Symptoms may include: Hunger. Feeling worried or nervous (anxious). Feeling sweaty and cold to the touch (clammy). Being dizzy or light-headed. Feeling sleepy. A fast heartbeat. Feeling grouchy (irritable). Tingling or loss of feeling (numbness) around your mouth, lips, or tongue. Restless sleep. Diabetes medicines can cause low blood sugar. You are more at risk: While you exercise. After exercise. During sleep. When you are sick. When you skip meals or do not eat for a long time. Treating low blood sugar If you think you have low blood sugar, eat or drink something sugary right away. Keep 15 grams of a fast-acting carb (carbohydrate) with you all the time. Make sure your family and friends know how to treat you if you cannot treat yourself. Treating very low blood sugar Severe hypoglycemia is  when your blood sugar is at or below 54 mg/dL (3 mmol/L). Severe hypoglycemia is an emergency. Get medical help right away. Call your local emergency services (911 in the [place] Do not wait to see if the symptoms will go away. Do not drive yourself to the hospital. You may need a glucagon shot if you have very low blood  sugar and you cannot eat or drink. Have a family member or friend learn how to check your blood sugar and how to give you a glucagon shot. Ask your doctor if you should have a kit for glucagon shots. Follow these instructions at home: Medicines Take prescribed insulin or diabetes medicines as told by your health care provider. Do not run out of insulin or other medicines. Plan ahead. If you use insulin, change the amount you take based on how active you are and what foods you eat. Your doctor will tell you how to do this. Take over-the-counter and prescription medicines only as told by your doctor. Eating and drinking  Eat healthy foods. These include: Low-fat (lean) proteins. Complex carbs, such as whole grains. Fresh fruits and vegetables. Low-fat dairy products. Healthy fats. Meet with a food expert (dietitian) to make an eating plan. Follow instructions from your doctor about what you cannot eat or drink. Drink enough fluid to keep your pee (urine) pale yellow. Keep track of carbs that you eat. Read food labels and learn serving sizes of foods. Follow your sick-day plan when you cannot eat or drink as normal. Make this plan with your doctor so it is ready to use. Activity Exercise as told by your doctor. You may need to: Do stretching and strength exercises two or more times a week. Do 150 minutes or more of exercise each week that makes your heart beat faster and makes you sweat. Spread out your exercise over 3 or more days a week. Do not go more than 2 days in a row without exercise. Talk with your doctor before you start a new exercise. Your doctor may tell you to change: How much insulin or medicines you take. How much food you eat. Lifestyle Do not smoke or use any products that contain nicotine or tobacco. If you need help quitting, ask your doctor. If you drink alcohol and your doctor says that it is safe for you: Limit how much you have to: 0-1 drink a day for women who  are not pregnant. 0-2 drinks a day for men. Know how much alcohol is in your drink. In the [place] one drink equals one 12 oz bottle of beer (355 mL), one 5 oz glass of wine (148 mL), or one 1 oz glass of hard liquor (44 mL). Learn to deal with stress. If you need help, ask your doctor. Body care  Stay up to date with your shots (immunizations). Have your eyes and feet checked by a doctor as often as told. Check your skin and feet every day. Check for cuts, bruises, redness, blisters, or sores. Brush your teeth and gums two times a day. Floss one or more times a day. Go to the dentist one or more times every 6 months. Stay at a healthy weight. General instructions Share your diabetes care plan with: Your work or school. People you live with. Carry a card or wear jewelry that says you have diabetes. Keep all follow-up visits. Questions to ask your doctor Do I need to meet with a certified expert in diabetes education and care? Where can I find  a support group? Where to find more information For help and guidance and more information about diabetes, please go to: American Diabetes Association: www.diabetes.org American Association of Diabetes Care and Education Specialists: www.diabeteseducator.org International Diabetes Federation: MemberVerification.ca Summary When you have type 2 diabetes, you must make sure your blood sugar (glucose) stays in a healthy range. You can do this with nutrition, exercise, medicines and insulin, and support from doctors and others. Check your blood sugar every day, or as often as told. Having diabetes can raise your risk for other long-term health problems. You may get medicines to help prevent these problems. Share your diabetes management plan with people at work, school, and home. Keep all follow-up visits. This information is not intended to replace advice given to you by your health care provider. Make sure you discuss any questions you have with your health  care provider. Document Revised: 02/13/2021 Document Reviewed: 02/13/2021 Elsevier Patient Education  2022 [place] next appointment is by telephone on 02/26/22 at 3 PM  Please call the care guide team at [phone] if you need to cancel or reschedule your appointment.   If you are experiencing a Mental Health or [place] or need someone to talk to, please call the Suicide and Crisis Lifeline: 988 call the [place] National Suicide Prevention Lifeline: 9152638372 or TTY: 509-532-0615 TTY 548-641-4900) to talk to a trained counselor call 1-800-273-TALK (toll free, 24 hour hotline) go to [facility] [address] [place] [phone]) call 911   Following is a copy of your full care plan:  Care Plan : [place] of Care  Updates made by Dimitri Ped, RN since 01/29/2022 12:00 AM     Problem: Chronic Disease Management and Care Coordination Needs (DM2, CAD, CHF, Asthma,HTN, HLD, chronic pain)   Priority: High     Long-Range Goal: Establish Plan of Care for Chronic Disease Management Needs (DM2, CAD, CHF, Asthma,HTN, HLD, chronic pain)   Start Date: 01/29/2022  Expected End Date: 01/29/2023  Priority: High  Note:   Current Barriers:  Knowledge Deficits related to plan of care for management of CHF, CAD, HTN, HLD, DMII, Asthma, and chronic back pain  Care Coordination needs related to Financial constraints related to food and Limited access to food Chronic Disease Management support and education needs related to CHF, CAD, HTN, HLD, DMII, Asthma, and chronic back pain   Financial Constraints  States that she sometimes has difficulty affording food and especially healthy food  RNCM Clinical Goal(s):  Patient will verbalize understanding of plan for management of CHF, CAD, HTN, HLD, DMII, Asthma, and chronic back pain  as evidenced by voiced adherence to plan of care verbalize basic understanding of   CHF, CAD, HTN, HLD, DMII, Asthma, and chronic back pain disease process and self health management plan as evidenced by voiced understanding and teach back take all medications exactly as prescribed and will call provider for medication related questions as evidenced by dispense report and pt verbalization  attend all scheduled medical appointments: Podiatry 03/07/22, Cardiology 03/14/22, CCM PharmD 05/28/22  as evidenced by medical records demonstrate Improved adherence to prescribed treatment plan for CHF, CAD, HTN, HLD, DMII, Asthma, and chronic back pain as evidenced by readings within limits, voiced adherence to plan of care continue to work with RN Care Manager to address care management and care coordination needs related to  CHF, CAD, HTN, HLD, DMII, Asthma, and chronic back pain as evidenced by adherence to  CM Team Scheduled appointments work with pharmacist to address Financial constraints related to food and Limited access to food related toCHF, CAD, HTN, HLD, DMII, Asthma, and chronic back pain  as evidenced by review or EMR and patient or pharmacist report work with community resource care guide to address needs related to  Financial constraints related to food and Limited access to food as evidenced by patient and/or community resource care guide support through collaboration with Consulting civil engineer, provider, and care team.   Interventions: 1:1 collaboration with primary care provider regarding development and update of comprehensive plan of care as evidenced by provider attestation and co-signature Inter-disciplinary care team collaboration (see longitudinal plan of care) Evaluation of current treatment plan related to  self management and patient's adherence to plan as established by provider   Asthma: (Status:New goal.) Long Term Goal Provided patient with basic written and verbal Asthma education on self care/management/and exacerbation prevention Advised patient to track and manage  Asthma triggers Provided instruction about proper use of medications used for management of Asthma including inhalers Advised patient to engage in light exercise as tolerated 3-5 days a week to aid in the the management of Asthma   CAD Interventions: (Status:  New goal.) Long Term Goal Assessed understanding of CAD diagnosis Medications reviewed including medications utilized in CAD treatment plan Provided education on importance of blood pressure control in management of CAD Provided education on Importance of limiting foods high in cholesterol Counseled on the importance of exercise goals with target of 150 minutes per week Reviewed Importance of taking all medications as prescribed Reviewed Importance of attending all scheduled provider appointments Advised to report any changes in symptoms or exercise tolerance   Heart Failure Interventions:  (Status:  New goal.) Long Term Goal Basic overview and discussion of pathophysiology of Heart Failure reviewed Provided education on low sodium diet Reviewed Heart Failure Action Plan in depth and provided written copy Assessed need for readable accurate scales in home Discussed importance of daily weight and advised patient to weigh and record daily Reviewed role of diuretics in prevention of fluid overload and management of heart failure; Discussed the importance of keeping all appointments with provider  Diabetes Interventions:  (Status:  New goal.) Long Term Goal Assessed patient's understanding of A1c goal: <7% Provided education to patient about basic DM disease process Reviewed medications with patient and discussed importance of medication adherence Provided patient with written educational materials related to hypo and hyperglycemia and importance of correct treatment Advised patient, providing education and rationale, to check cbg twice a day and record, calling provider for findings outside established parameters Referral made to  pharmacy team for assistance with cost, next CCM Pharm D appt 05/28/22 Referral made to community resources care guide team for assistance with food resources and finances  Review of patient status, including review of consultants reports, relevant laboratory and other test results, and medications completed Screening for signs and symptoms of depression related to chronic disease state  Assessed social determinant of health barriers Lab Results  Component Value Date   HGBA1C 7.5 (H) 01/25/2022   Hyperlipidemia Interventions:  (Status:  New goal.) Long Term Goal Medication review performed; medication list updated in electronic medical record.  Provider established cholesterol goals reviewed Counseled on importance of regular laboratory monitoring as prescribed Reviewed role and benefits of statin for ASCVD risk reduction Reviewed importance of limiting foods high in cholesterol Reviewed exercise goals and target of 150 minutes per week  Hypertension Interventions:  (Status:  New goal.) Long Term Goal Last practice recorded BP readings:  BP Readings from Last 3 Encounters:  01/08/22 108/65  12/22/21 (!) 102/59  09/12/21 108/78  Most recent eGFR/CrCl: No results found for: EGFR  No components found for: CRCL  Evaluation of current treatment plan related to hypertension self management and patient's adherence to plan as established by provider Provided education to patient re: stroke prevention, s/s of heart attack and stroke Counseled on the importance of exercise goals with target of 150 minutes per week Discussed plans with patient for ongoing care management follow up and provided patient with direct contact information for care management team Advised patient, providing education and rationale, to monitor blood pressure daily and record, calling PCP for findings outside established parameters Provided education on prescribed diet low sodium low CHO heart healthy  Pain Interventions:   (Status:  New goal.) Long Term Goal Pain assessment performed Medications reviewed Reviewed provider established plan for pain management Discussed importance of adherence to all scheduled medical appointments Counseled on the importance of reporting any/all new or changed pain symptoms or management strategies to pain management provider Advised patient to report to care team affect of pain on daily activities Discussed use of relaxation techniques and/or diversional activities to assist with pain reduction (distraction, imagery, relaxation, massage, acupressure, TENS, heat, and cold application Reviewed with patient prescribed pharmacological and nonpharmacological pain relief strategies  Patient Goals/Self-Care Activities: Take all medications as prescribed Attend all scheduled provider appointments Call pharmacy for medication refills 3-7 days in advance of running out of medications Call provider office for new concerns or questions  call office if I gain more than 2 pounds in one day or 5 pounds in one week watch for swelling in feet, ankles and legs every day weigh myself daily develop a rescue plan eat more whole grains, fruits and vegetables, lean meats and healthy fats keep appointment with eye doctor check blood sugar at prescribed times: twice daily and when you have symptoms of low or high blood sugar take the blood sugar log to all doctor visits fill half of plate with vegetables manage portion size wash and dry feet carefully every day check blood pressure daily choose a place to take my blood pressure (home, clinic or office, retail store) write blood pressure results in a log or diary take blood pressure log to all doctor appointments limit salt intake to 2329m/day call for medicine refill 2 or 3 days before it runs out take all medications exactly as prescribed call doctor with any symptoms you believe are related to your medicine  Follow Up Plan:  Telephone  follow up appointment with care management team member scheduled for:  02/26/22 The patient has been provided with contact information for the care management team and has been advised to call with any health related questions or concerns.       Consent to CCM Services: Ms. CCaudellwas given information about Chronic Care Management services including:  CCM service includes personalized support from designated clinical staff supervised by her physician, including individualized plan of care and coordination with other care providers 24/7 contact phone numbers for assistance for urgent and routine care needs. Service will only be billed when office clinical staff spend 20 minutes or more in a month to coordinate care. Only one practitioner may furnish and bill the service in a calendar month. The patient may stop CCM services at any time (effective at the end of the month) by phone call to the office  staff. The patient will be responsible for cost sharing (co-pay) of up to 20% of the service fee (after annual deductible is met).  Patient agreed to services and verbal consent obtained.   Patient verbalizes understanding of instructions and care plan provided today and agrees to view in [place]. Active MyChart status confirmed with patient.    Telephone follow up appointment with care management team member scheduled for: 02/26/22 at 3 PM [place], [facility], CDE Care Management Coordinator [facility] [phone]

## 2022-01-30 DIAGNOSIS — E1169 Type 2 diabetes mellitus with other specified complication: Secondary | ICD-10-CM | POA: Diagnosis not present

## 2022-01-30 DIAGNOSIS — J4530 Mild persistent asthma, uncomplicated: Secondary | ICD-10-CM | POA: Diagnosis not present

## 2022-01-30 DIAGNOSIS — I2510 Atherosclerotic heart disease of native coronary artery without angina pectoris: Secondary | ICD-10-CM

## 2022-01-30 DIAGNOSIS — E785 Hyperlipidemia, unspecified: Secondary | ICD-10-CM

## 2022-01-30 DIAGNOSIS — E119 Type 2 diabetes mellitus without complications: Secondary | ICD-10-CM | POA: Diagnosis not present

## 2022-01-30 DIAGNOSIS — E1159 Type 2 diabetes mellitus with other circulatory complications: Secondary | ICD-10-CM | POA: Diagnosis not present

## 2022-01-30 DIAGNOSIS — I152 Hypertension secondary to endocrine disorders: Secondary | ICD-10-CM | POA: Diagnosis not present

## 2022-01-31 ENCOUNTER — Other Ambulatory Visit: Payer: Self-pay | Admitting: *Deleted

## 2022-01-31 ENCOUNTER — Telehealth: Payer: Self-pay

## 2022-01-31 DIAGNOSIS — C50412 Malignant neoplasm of upper-outer quadrant of left female breast: Secondary | ICD-10-CM

## 2022-01-31 MED ORDER — LEVOTHYROXINE SODIUM 75 MCG PO TABS: 75.0000 ug | ORAL_TABLET | Freq: Every day | ORAL | 1 refills | Status: DC

## 2022-01-31 NOTE — Progress Notes (Signed)
M

## 2022-01-31 NOTE — Telephone Encounter (Signed)
? ?  Telephone encounter was:  Unsuccessful.  01/31/2022 ?Name: NIASHA DEVINS MRN: [id number] DOB: [birth year] ? ?Unsuccessful outbound call made today to assist with:  Food Insecurity and financial resources ? ?Outreach Attempt:  1st Attempt ? ?A HIPAA compliant voice message was left requesting a return call.  Instructed patient to call back at [phone]. ? ?Nyeemah Jennette, [place], CHC ?Care Guide  Embedded Care Coordination ?[facility]  ?[address] ?[place], [place] [address] ???[email or web address]  ?? [phone]   ?[email or web address] ?  ?

## 2022-01-31 NOTE — Addendum Note (Signed)
Addended by: Agnes Lawrence on: 01/31/2022 10:28 AM   Modules accepted: Orders

## 2022-02-02 ENCOUNTER — Telehealth: Payer: Self-pay

## 2022-02-02 NOTE — Telephone Encounter (Signed)
? ?  Telephone encounter was:  Unsuccessful.  02/02/2022 ?Name: Shelby Anderson MRN: [id number] DOB: Sep 10, 1956 ? ?Unsuccessful outbound call made today to assist with:  Food Insecurity and Financial Difficulties related to utilities ? ?Outreach Attempt:  2nd Attempt ? ?A HIPAA compliant voice message was left requesting a return call.  Instructed patient to call back at [phone]. ? ?Aaminah Forrester, [place], CHC ?Care Guide  Embedded Care Coordination ?[facility]  ?[address] ?[place], [place] [address] ???[email or web address]  ?? [phone]   ?[email or web address] ?  ?

## 2022-02-05 ENCOUNTER — Encounter: Payer: Self-pay | Admitting: Family Medicine

## 2022-02-05 ENCOUNTER — Ambulatory Visit (INDEPENDENT_AMBULATORY_CARE_PROVIDER_SITE_OTHER): Payer: Medicare Other | Admitting: Family Medicine

## 2022-02-05 VITALS — BP 122/82 | HR 70 | Temp 98.3°F | Ht 66.0 in | Wt 223.8 lb

## 2022-02-05 DIAGNOSIS — E1169 Type 2 diabetes mellitus with other specified complication: Secondary | ICD-10-CM | POA: Diagnosis not present

## 2022-02-05 DIAGNOSIS — E785 Hyperlipidemia, unspecified: Secondary | ICD-10-CM | POA: Diagnosis not present

## 2022-02-05 DIAGNOSIS — I152 Hypertension secondary to endocrine disorders: Secondary | ICD-10-CM

## 2022-02-05 DIAGNOSIS — E119 Type 2 diabetes mellitus without complications: Secondary | ICD-10-CM

## 2022-02-05 DIAGNOSIS — Z23 Encounter for immunization: Secondary | ICD-10-CM

## 2022-02-05 DIAGNOSIS — I10 Essential (primary) hypertension: Secondary | ICD-10-CM

## 2022-02-05 DIAGNOSIS — G4733 Obstructive sleep apnea (adult) (pediatric): Secondary | ICD-10-CM

## 2022-02-05 DIAGNOSIS — E1159 Type 2 diabetes mellitus with other circulatory complications: Secondary | ICD-10-CM | POA: Diagnosis not present

## 2022-02-05 MED ORDER — EMPAGLIFLOZIN 10 MG PO TABS: 10.0000 mg | ORAL_TABLET | Freq: Every day | ORAL | 0 refills | Status: DC

## 2022-02-05 MED ORDER — LISINOPRIL 5 MG PO TABS: 5.0000 mg | ORAL_TABLET | Freq: Every day | ORAL | 3 refills | Status: DC

## 2022-02-05 NOTE — Patient Instructions (Signed)
If you are doing well with the jardiance, sent me a message in 2 weeks to let me know so I can work on getting you a real prescription. ?

## 2022-02-05 NOTE — Progress Notes (Signed)
Shelby Anderson DOB: 08/21/1956 Encounter date: 02/05/2022  This is a 66 y.o. female who presents with Chief Complaint  Patient presents with   Results    History of present illness: Lab work done on 01/28/2022.  Thyroid under replaced and just increased to 75 mcg daily.  Additionally, I asked her to double up on her Lipitor to an 80 mg dose for better LDL control.  I encouraged her to follow-up for A1c discussion since A1c was at 7.5 (previously 7.4).  Diabetes type 2: Glipizide 5 mg daily, metformin 500 mg twice daily.   She states that bp has been all over the place in the last few weeks.   Sleep apnea: Wears CPAP nightly.  She has excellent compliance according to pulmonology notes.  Allergies  Allergen Reactions   Lodine [Etodolac] Anaphylaxis, Hives and Swelling   Oxycontin [Oxycodone Hcl] Anaphylaxis    hives, trouble breathing, tongue swelling (Only Oxycontin) Tolerates plain oxycodone.   Penicillins Anaphylaxis    Told by a surgeon never to take it again. Has patient had a PCN reaction causing immediate rash, facial/tongue/throat swelling, SOB or lightheadedness with hypotension: Yes Has patient had a PCN reaction causing severe rash involving mucus membranes or skin necrosis: Unknown Has patient had a PCN reaction that required hospitalization: No Has patient had a PCN reaction occurring within the last 10 years: No If all of the above answers are "NO", then may proceed with Cephalosporin use.   Aspirin Other (See Comments)    High-dose caused GI Bleeds   Darvocet [Propoxyphene N-Acetaminophen] Hives   Nitroglycerin Other (See Comments)    IV-BP drops dramatically Can take po   Tramadol Hives and Itching   Ultram [Tramadol Hcl] Hives   Valium Other (See Comments)    Circulation problems. "Legs turned black".   Current Meds  Medication Sig   albuterol (PROVENTIL) (2.5 MG/3ML) 0.083% nebulizer solution Take 3 mLs (2.5 mg total) by nebulization every 6 (six)  hours as needed for wheezing or shortness of breath (J45.40).   albuterol (VENTOLIN HFA) 108 (90 Base) MCG/ACT inhaler Inhale 2 puffs into the lungs every 6 (six) hours as needed for wheezing or shortness of breath.   aspirin EC 81 MG tablet Take 81 mg at bedtime by mouth.    atorvastatin (LIPITOR) 40 MG tablet TAKE 1 TABLET BY MOUTH EVERYDAY AT BEDTIME.   Blood Glucose Monitoring Suppl (ONE TOUCH ULTRA 2) w/Device KIT USE TO CHECK BLOOD SUGAR 2 TIMES A DAY   budesonide-formoterol (SYMBICORT) 80-4.5 MCG/ACT inhaler INHALE 2 PUFFS INTO THE LUNGS EVERY MORNING AND ANOTHER 2 PUFFS 12 HOURS LATER   buPROPion (WELLBUTRIN XL) 150 MG 24 hr tablet TAKE 1 TABLET BY MOUTH EVERY DAY   Calcium Carbonate-Vitamin D 600-200 MG-UNIT TABS Take 1 tablet by mouth daily.   CINNAMON PO Take 1,000 mg 2 (two) times daily by mouth.   doxepin (SINEQUAN) 10 MG capsule Take 30 mg by mouth at bedtime.   famotidine (PEPCID) 20 MG tablet Take 1 tablet (20 mg total) by mouth 2 (two) times daily as needed for heartburn or indigestion.   fluticasone (FLONASE) 50 MCG/ACT nasal spray Place 2 sprays at bedtime as needed into both nostrils for allergies or rhinitis.    furosemide (LASIX) 20 MG tablet TAKE 1 OR 2 TABLETS BY MOUTH EVERY DAY AS DIRECTED   glipiZIDE (GLUCOTROL XL) 5 MG 24 hr tablet TAKE 1 TABLET BY MOUTH EVERYDAY WITH BREAKFAST. **DUE FOR YEARLY PHYSICAL**   L-Methylfolate (DEPLIN) 7.5  MG TABS Take 7.5 mg by mouth daily with breakfast.   Lancets (ONETOUCH DELICA PLUS [id number]) MISC USE TO CHECK BLOOD SUGAR 2 TIMES A DAY   Lancets (ONETOUCH DELICA PLUS [id number]) MISC Use as directed   levothyroxine (SYNTHROID) 75 MCG tablet Take 1 tablet (75 mcg total) by mouth daily before breakfast.   LYRICA 150 MG capsule Take 150 mg by mouth 3 (three) times daily.    metFORMIN (GLUCOPHAGE) 500 MG tablet TAKE 1 TABLET BY MOUTH TWICE A DAY WITH MEALS   metoprolol succinate (TOPROL-XL) 25 MG 24 hr tablet TAKE 1 TABLET BY MOUTH DAILY.  TAKE WITH OR IMMEDIATELY FOLLOWING A MEAL.   Misc Natural Products (GLUCOS-CHONDROIT-MSM COMPLEX PO) Take 2 tablets by mouth daily.   mometasone (ELOCON) 0.1 % cream Apply topically.   montelukast (SINGULAIR) 10 MG tablet TAKE 1 TABLET BY MOUTH EVERYDAY AT BEDTIME   Multiple Vitamin (MULITIVITAMIN WITH MINERALS) TABS Take 1 tablet by mouth daily.   mupirocin cream (BACTROBAN) 2 % Apply once daily to lesions on feet as needed.   NEXIUM 40 MG capsule TAKE 1 CAPSULE BY MOUTH TWICE A DAY   nitroGLYCERIN (NITROSTAT) 0.4 MG SL tablet Place 1 tablet (0.4 mg total) under the tongue every 5 (five) minutes as needed for chest pain.   ondansetron (ZOFRAN) 4 MG tablet Take 1 tablet (4 mg total) by mouth every 8 (eight) hours as needed for nausea or vomiting.   ONETOUCH ULTRA test strip USE TO CHECK BLOOD SUGAR 2 TIMES A DAY   oxyCODONE (ROXICODONE) 15 MG immediate release tablet Take 15 mg by mouth 5 (five) times daily.   potassium chloride (KLOR-CON) 10 MEQ tablet TAKE 2 TABLETS BY MOUTH EVERY DAY   promethazine (PHENERGAN) 25 MG tablet TAKE 1 TABLET BY MOUTH EVERYDAY AT BEDTIME   Spacer/Aero-Holding Chambers DEVI 1 Device by Does not apply route daily as needed.   tiZANidine (ZANAFLEX) 4 MG tablet Take 4 mg by mouth every 8 (eight) hours as needed for muscle spasms.   vitamin B-12 (CYANOCOBALAMIN) 500 MCG tablet Take 500 mcg by mouth daily.    vitamin E 200 UNIT capsule Take 200 Units by mouth daily.    Review of Systems  Constitutional:  Negative for chills, fatigue and fever.  Respiratory:  Negative for cough, chest tightness, shortness of breath and wheezing.   Cardiovascular:  Negative for chest pain, palpitations and leg swelling.   Objective:  BP 122/82 (BP Location: Right Arm, Patient Position: Sitting, Cuff Size: Large)    Pulse 70    Temp 98.3 F (36.8 C) (Oral)    Ht '5\' 6"'  (1.676 m)    Wt 223 lb 12.8 oz (101.5 kg)    SpO2 96%    BMI 36.12 kg/m   Weight: 223 lb 12.8 oz (101.5 kg)   BP  Readings from Last 3 Encounters:  02/05/22 122/82  01/08/22 108/65  12/22/21 (!) 102/59   Wt Readings from Last 3 Encounters:  02/05/22 223 lb 12.8 oz (101.5 kg)  01/08/22 221 lb (100.2 kg)  12/22/21 221 lb 9.6 oz (100.5 kg)    Physical Exam Constitutional:      General: She is not in acute distress.    Appearance: She is well-developed.  HENT:     Head: Normocephalic and atraumatic.  Cardiovascular:     Rate and Rhythm: Normal rate and regular rhythm.     Heart sounds: Normal heart sounds. No murmur heard. Pulmonary:     Effort: Pulmonary effort is  normal.     Breath sounds: Normal breath sounds.  Abdominal:     General: Bowel sounds are normal. There is no distension.     Palpations: Abdomen is soft.     Tenderness: There is no abdominal tenderness. There is no guarding.  Feet:     Comments: Normal bilateral monofilament foot exam.  Mild heel callus.  Skin is intact. Skin:    General: Skin is warm and dry.     Comments: Sensory exam of the foot is normal, tested with the monofilament. Good pulses, no lesions or ulcers, good peripheral pulses.  Psychiatric:        Judgment: Judgment normal.    Assessment/Plan  1. Type 2 diabetes mellitus without complication, without long-term current use of insulin (HCC) We are going to add lisinopril and low-dose to help with renal protection.  Previously she was on chemotherapy agents that raise concern for med reactions with ACE/ARB's.  We discussed benefits of ACE/ARB and diabetes. She was given Jardiance 10 mg samples today in the office.  Continue with metformin.  Would like to get A1c less than 7.  She will let me know how she does with the Jardiance.  We may need to help with med cost. - lisinopril (ZESTRIL) 5 MG tablet; Take 1 tablet (5 mg total) by mouth daily.  Dispense: 90 tablet; Refill: 3  2. Hyperlipidemia associated with type 2 diabetes mellitus (HCC) We are increasing Lipitor to 80 mg.  She is just above goal with LDL,  but since she is already on Lipitor 40 mg, we will increase to 84 reaching goal LDL.  3. OSA (obstructive sleep apnea) She is compliant with CPAP.  4. Hypertension associated with diabetes ([place]) Blood pressure has been generally well controlled.  Adding lisinopril today and will strive to maintain excellent control pressures.  5. Need for immunization against influenza - Flu Vaccine QUAD 6+ mos PF IM (Fluarix Quad PF)  6. Need for prophylactic vaccination against Streptococcus pneumoniae (pneumococcus) - Pneumococcal conjugate vaccine 20-valent (Prevnar 20)  Return in about 8 weeks (around 04/02/2022) for lab visit.       [name], MD

## 2022-02-06 ENCOUNTER — Telehealth: Payer: Self-pay

## 2022-02-06 NOTE — Telephone Encounter (Signed)
? ?  Telephone encounter was:  Unsuccessful.  02/06/2022 ?Name: SCARLET ABAD MRN: [id number] DOB: 01/05/1956 ? ?Unsuccessful outbound call made today to assist with:  Food Insecurity and utilities ? ?Outreach Attempt:  3rd Attempt.  Referral closed unable to contact patient. ? ?A HIPAA compliant voice message was left requesting a return call.  Instructed patient to call back at [phone]. ? ?Rinnah Peppel, [place], CHC ?Care Guide  Embedded Care Coordination ?[facility]  ?[address] ?[place], [place] [address] ???[email or web address]  ?? [phone]   [email or web address] ?  ?

## 2022-02-20 ENCOUNTER — Encounter: Payer: Self-pay | Admitting: Family Medicine

## 2022-02-22 MED ORDER — ATORVASTATIN CALCIUM 80 MG PO TABS: 80.0000 mg | ORAL_TABLET | Freq: Every day | ORAL | 1 refills | Status: DC

## 2022-02-25 ENCOUNTER — Other Ambulatory Visit: Payer: Self-pay | Admitting: Family Medicine

## 2022-02-25 MED ORDER — EMPAGLIFLOZIN 25 MG PO TABS
25.0000 mg | ORAL_TABLET | Freq: Every day | ORAL | 1 refills | Status: DC
Start: 2022-02-25 — End: 2022-06-13

## 2022-02-26 ENCOUNTER — Ambulatory Visit (INDEPENDENT_AMBULATORY_CARE_PROVIDER_SITE_OTHER): Payer: Medicare Other

## 2022-02-26 DIAGNOSIS — M47816 Spondylosis without myelopathy or radiculopathy, lumbar region: Secondary | ICD-10-CM | POA: Diagnosis not present

## 2022-02-26 DIAGNOSIS — G893 Neoplasm related pain (acute) (chronic): Secondary | ICD-10-CM | POA: Diagnosis not present

## 2022-02-26 DIAGNOSIS — I2510 Atherosclerotic heart disease of native coronary artery without angina pectoris: Secondary | ICD-10-CM

## 2022-02-26 DIAGNOSIS — Z79891 Long term (current) use of opiate analgesic: Secondary | ICD-10-CM | POA: Diagnosis not present

## 2022-02-26 DIAGNOSIS — E1159 Type 2 diabetes mellitus with other circulatory complications: Secondary | ICD-10-CM

## 2022-02-26 DIAGNOSIS — G894 Chronic pain syndrome: Secondary | ICD-10-CM | POA: Diagnosis not present

## 2022-02-26 DIAGNOSIS — E119 Type 2 diabetes mellitus without complications: Secondary | ICD-10-CM

## 2022-02-26 NOTE — Patient Instructions (Signed)
Visit Information ? ?Thank you for taking time to visit with me today. Please don't hesitate to contact me if I can be of assistance to you before our next scheduled telephone appointment. ? ?Following are the goals we discussed today:  ?Take all medications as prescribed ?Attend all scheduled provider appointments ?Call pharmacy for medication refills 3-7 days in advance of running out of medications ?Call provider office for new concerns or questions  ?call office if I gain more than 2 pounds in one day or 5 pounds in one week ?keep legs up while sitting ?watch for swelling in feet, ankles and legs every day ?weigh myself daily ?develop a rescue plan ?follow rescue plan if symptoms flare-up ?eat more whole grains, fruits and vegetables, lean meats and healthy fats ?keep appointment with eye doctor ?check blood sugar at prescribed times: twice daily and when you have symptoms of low or high blood sugar ?take the blood sugar log to all doctor visits ?fill half of plate with vegetables ?manage portion size ?wash and dry feet carefully every day ?check blood pressure daily ?choose a place to take my blood pressure (home, clinic or office, retail store) ?write blood pressure results in a log or diary ?take blood pressure log to all doctor appointments ?limit salt intake to '2300mg'$ /day ?call for medicine refill 2 or 3 days before it runs out ?take all medications exactly as prescribed ?call doctor with any symptoms you believe are related to your medicine ? ?Our next appointment is by telephone on 04/16/22 at 3 PM ? ?Please call the care guide team at [phone] if you need to cancel or reschedule your appointment.  ? ?If you are experiencing a Mental Health or [place] or need someone to talk to, please call the Suicide and Crisis Lifeline: 988 ?call the [place] National Suicide Prevention Lifeline: (240) 760-2943 or TTY: 5131622766 TTY [phone]) to talk to a trained counselor ?call 1-800-273-TALK  (toll free, 24 hour hotline) ?go to [facility] [address] [place] [phone]) ?call 911  ? ?Patient verbalizes understanding of instructions and care plan provided today and agrees to view in [place]. Active MyChart status confirmed with patient.   ? ?Peter Garter RN, BSN,CCM, CDE ?Care Management Coordinator ?[facility] ?[phone]   ?

## 2022-02-26 NOTE — Chronic Care Management (AMB) (Signed)
?Chronic Care Management  ? ?CCM RN Visit Note ? ?02/26/2022 ?Name: Shelby Anderson MRN: [id number] DOB: 12-19-1955 ? ?Subjective: ?Shelby Anderson is a 66 y.o. year old female who is a primary care patient of Koberlein, Steele Berg, MD. The care management team was consulted for assistance with disease management and care coordination needs.   ? ?Engaged with patient by telephone for follow up visit in response to provider referral for case management and/or care coordination services.  ? ?Consent to Services:  ?The patient was given information about Chronic Care Management services, agreed to services, and gave verbal consent prior to initiation of services.  Please see initial visit note for detailed documentation.  ? ?Patient agreed to services and verbal consent obtained.  ? ?Assessment: Review of patient past medical history, allergies, medications, health status, including review of consultants reports, laboratory and other test data, was performed as part of comprehensive evaluation and provision of chronic care management services.  ? ?SDOH (Social Determinants of Health) assessments and interventions performed:   ? ?CCM Care Plan ? ?Allergies  ?Allergen Reactions  ? Lodine [Etodolac] Anaphylaxis, Hives and Swelling  ? Oxycontin [Oxycodone Hcl] Anaphylaxis  ?  hives, trouble breathing, tongue swelling (Only Oxycontin) Tolerates plain oxycodone.  ? Penicillins Anaphylaxis  ?  Told by a surgeon never to take it again. ?Has patient had a PCN reaction causing immediate rash, facial/tongue/throat swelling, SOB or lightheadedness with hypotension: Yes ?Has patient had a PCN reaction causing severe rash involving mucus membranes or skin necrosis: Unknown ?Has patient had a PCN reaction that required hospitalization: No ?Has patient had a PCN reaction occurring within the last 10 years: No ?If all of the above answers are "NO", then may proceed with Cephalosporin use.  ? Aspirin Other (See Comments)  ?   High-dose caused GI Bleeds  ? Darvocet [Propoxyphene N-Acetaminophen] Hives  ? Nitroglycerin Other (See Comments)  ?  IV-BP drops dramatically ?Can take po  ? Tramadol Hives and Itching  ? Ultram [Tramadol Hcl] Hives  ? Valium Other (See Comments)  ?  Circulation problems. "Legs turned black".  ? ? ?Outpatient Encounter Medications as of 02/26/2022  ?Medication Sig  ? albuterol (PROVENTIL) (2.5 MG/3ML) 0.083% nebulizer solution Take 3 mLs (2.5 mg total) by nebulization every 6 (six) hours as needed for wheezing or shortness of breath (J45.40).  ? albuterol (VENTOLIN HFA) 108 (90 Base) MCG/ACT inhaler Inhale 2 puffs into the lungs every 6 (six) hours as needed for wheezing or shortness of breath.  ? aspirin EC 81 MG tablet Take 81 mg at bedtime by mouth.   ? atorvastatin (LIPITOR) 80 MG tablet Take 1 tablet (80 mg total) by mouth daily.  ? Blood Glucose Monitoring Suppl (ONE TOUCH ULTRA 2) w/Device KIT USE TO CHECK BLOOD SUGAR 2 TIMES A DAY  ? budesonide-formoterol (SYMBICORT) 80-4.5 MCG/ACT inhaler INHALE 2 PUFFS INTO THE LUNGS EVERY MORNING AND ANOTHER 2 PUFFS 12 HOURS LATER  ? buPROPion (WELLBUTRIN XL) 150 MG 24 hr tablet TAKE 1 TABLET BY MOUTH EVERY DAY  ? Calcium Carbonate-Vitamin D 600-200 MG-UNIT TABS Take 1 tablet by mouth daily.  ? CINNAMON PO Take 1,000 mg 2 (two) times daily by mouth.  ? doxepin (SINEQUAN) 10 MG capsule Take 30 mg by mouth at bedtime.  ? empagliflozin (JARDIANCE) 25 MG TABS tablet Take 1 tablet (25 mg total) by mouth daily before breakfast.  ? famotidine (PEPCID) 20 MG tablet Take 1 tablet (20 mg total) by mouth 2 (two) times  daily as needed for heartburn or indigestion.  ? fluticasone (FLONASE) 50 MCG/ACT nasal spray Place 2 sprays at bedtime as needed into both nostrils for allergies or rhinitis.   ? furosemide (LASIX) 20 MG tablet TAKE 1 OR 2 TABLETS BY MOUTH EVERY DAY AS DIRECTED  ? glipiZIDE (GLUCOTROL XL) 5 MG 24 hr tablet TAKE 1 TABLET BY MOUTH EVERYDAY WITH BREAKFAST. **DUE FOR  YEARLY PHYSICAL**  ? hydrALAZINE (APRESOLINE) 25 MG tablet Take 1 tablet (25 mg total) by mouth daily as needed (for BP >140/90 mmHg, up to three times daily).  ? L-Methylfolate (DEPLIN) 7.5 MG TABS Take 7.5 mg by mouth daily with breakfast.  ? Lancets (ONETOUCH DELICA PLUS [id number]) MISC USE TO CHECK BLOOD SUGAR 2 TIMES A DAY  ? Lancets (ONETOUCH DELICA PLUS [id number]) MISC Use as directed  ? levothyroxine (SYNTHROID) 75 MCG tablet Take 1 tablet (75 mcg total) by mouth daily before breakfast.  ? lisinopril (ZESTRIL) 5 MG tablet Take 1 tablet (5 mg total) by mouth daily.  ? LYRICA 150 MG capsule Take 150 mg by mouth 3 (three) times daily.   ? metFORMIN (GLUCOPHAGE) 500 MG tablet TAKE 1 TABLET BY MOUTH TWICE A DAY WITH MEALS  ? metoprolol succinate (TOPROL-XL) 25 MG 24 hr tablet TAKE 1 TABLET BY MOUTH DAILY. TAKE WITH OR IMMEDIATELY FOLLOWING A MEAL.  ? Misc Natural Products (GLUCOS-CHONDROIT-MSM COMPLEX PO) Take 2 tablets by mouth daily.  ? mometasone (ELOCON) 0.1 % cream Apply topically.  ? montelukast (SINGULAIR) 10 MG tablet TAKE 1 TABLET BY MOUTH EVERYDAY AT BEDTIME  ? Multiple Vitamin (MULITIVITAMIN WITH MINERALS) TABS Take 1 tablet by mouth daily.  ? mupirocin cream (BACTROBAN) 2 % Apply once daily to lesions on feet as needed.  ? NEXIUM 40 MG capsule TAKE 1 CAPSULE BY MOUTH TWICE A DAY  ? nitroGLYCERIN (NITROSTAT) 0.4 MG SL tablet Place 1 tablet (0.4 mg total) under the tongue every 5 (five) minutes as needed for chest pain.  ? ondansetron (ZOFRAN) 4 MG tablet Take 1 tablet (4 mg total) by mouth every 8 (eight) hours as needed for nausea or vomiting.  ? ONETOUCH ULTRA test strip USE TO CHECK BLOOD SUGAR 2 TIMES A DAY  ? oxyCODONE (ROXICODONE) 15 MG immediate release tablet Take 15 mg by mouth 5 (five) times daily.  ? potassium chloride (KLOR-CON) 10 MEQ tablet TAKE 2 TABLETS BY MOUTH EVERY DAY  ? promethazine (PHENERGAN) 25 MG tablet TAKE 1 TABLET BY MOUTH EVERYDAY AT BEDTIME  ? Spacer/Aero-Holding Chambers  DEVI 1 Device by Does not apply route daily as needed.  ? tiZANidine (ZANAFLEX) 4 MG tablet Take 4 mg by mouth every 8 (eight) hours as needed for muscle spasms.  ? vitamin B-12 (CYANOCOBALAMIN) 500 MCG tablet Take 500 mcg by mouth daily.   ? vitamin E 200 UNIT capsule Take 200 Units by mouth daily.  ? ?No facility-administered encounter medications on file as of 02/26/2022.  ? ? ?Patient Active Problem List  ? Diagnosis Date Noted  ? Spondylosis without myelopathy or radiculopathy, cervical region 04/21/2021  ? Neutropenia with fever ([place]) 10/01/2019  ? Genetic testing 09/17/2019  ? Family history of melanoma   ? Family history of pancreatic cancer   ? Malignant neoplasm of upper-outer quadrant of left breast in female, estrogen receptor negative ([place]) 09/07/2019  ? Encounter for loop recorder at end of battery life 04/24/2018  ? Esophageal stricture 07/01/2017  ? Hyperlipidemia associated with type 2 diabetes mellitus ([place]) 05/20/2017  ? Allergic  rhinitis 03/11/2017  ? Dilated cardiomyopathy ([place]) 02/07/2017  ? 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 Morbid obesity due to excess calories ([place]) 09/26/2016  ? Increased endometrial stripe thickness 06/27/2016  ? Intramural leiomyoma of uterus 06/27/2016  ? Type 2 diabetes mellitus without complication, without long-term current use of insulin ([place]) 12/06/2015  ? Hypertension associated with diabetes ([place]) 07/19/2015  ? Arrhythmia 07/19/2015  ? Orthostatic hypotension 07/19/2015  ? GERD (gastroesophageal reflux disease) 07/19/2015  ? Chronic back pain 07/19/2015  ? Neuropathy 07/19/2015  ? Symptomatic PVCs 11/02/2014  ? Syncope 11/02/2014  ? Chest pain 12/23/2013  ? Disorder of cervix 03/10/2013  ? Vaginal atrophy 03/10/2013  ? OSA (obstructive sleep apnea) 07/31/2012  ? Asthma 04/29/2012  ? Coronary artery disease 11/20/2011  ? ? ?Conditions to be addressed/monitored:CHF, CAD, HTN, HLD, DMII, Asthma, and chronic pain ? ?Care Plan : RN Care Manager  Plan of Care  ?Updates made by Dimitri Ped, RN since 02/26/2022 12:00 AM  ?  ? ?Problem: Chronic Disease Management and Care Coordination Needs (DM2, CAD, CHF, Asthma,HTN, HLD, chronic pain)   ?Priority

## 2022-02-27 ENCOUNTER — Other Ambulatory Visit: Payer: Self-pay | Admitting: Family Medicine

## 2022-02-27 ENCOUNTER — Other Ambulatory Visit: Payer: Self-pay | Admitting: Pulmonary Disease

## 2022-02-27 ENCOUNTER — Other Ambulatory Visit: Payer: Self-pay | Admitting: Nurse Practitioner

## 2022-02-27 DIAGNOSIS — F339 Major depressive disorder, recurrent, unspecified: Secondary | ICD-10-CM

## 2022-02-28 ENCOUNTER — Other Ambulatory Visit: Payer: Self-pay

## 2022-02-28 ENCOUNTER — Ambulatory Visit: Payer: Medicare Other | Attending: Neurology

## 2022-02-28 DIAGNOSIS — M542 Cervicalgia: Secondary | ICD-10-CM | POA: Insufficient documentation

## 2022-02-28 DIAGNOSIS — G4486 Cervicogenic headache: Secondary | ICD-10-CM | POA: Insufficient documentation

## 2022-02-28 DIAGNOSIS — R252 Cramp and spasm: Secondary | ICD-10-CM | POA: Insufficient documentation

## 2022-02-28 NOTE — Therapy (Signed)
?OUTPATIENT PHYSICAL THERAPY CERVICAL EVALUATION ? ? ?Patient Name: Shelby Anderson ?MRN: [id number] ?DOB:March 07, 1956, 66 y.o., female ?Today's Date: 03/01/2022 ? ? PT End of Session - 03/01/22 1444   ? ? Visit Number 1   ? Number of Visits 13   ? Date for PT Re-Evaluation 04/20/22   ? Authorization Type UHC MEDICARE   ? PT Start Time 1024   ? PT Stop Time 1110   ? PT Time Calculation (min) 46 min   ? Activity Tolerance Patient tolerated treatment well   ? Behavior During Therapy [facility] for tasks assessed/performed   ? ?  ?  ? ?  ? ? ?Past Medical History:  ?Diagnosis Date  ? Allergy   ? Anemia   ? "chronic"  ? Angina   ? Prinzmetal angina  ? Anxiety and depression   ? Arthritis   ? Asthma   ? Atrial fibrillation ([place])   ? h/o "AF w/frequent PVCs"  ? Breast cancer ([place])   ? Left  ? Cancer [facility])   ? hx of skin cancer   ? CHF (congestive heart failure) ([place])   ? Chronic back pain greater than 3 months duration   ? on chronic narcotics, treated at pain clinic  ? Chronic respiratory failure ([place]) 09/15/2015  ? ONO 09/04/15 + desats >begin O2 at 2l/ m   ? Colon polyps   ? hyperplastic  ? Coronary artery disease   ? Arrythmia, orthostatic hypotension, HLD, HTN; sees Dr. Einar Gip  ? Difficult intubation   ? "TMJ & woke up when they were still cutting on me"  ? Dysrhythmia   ? sees Dr. Einar Gip and a cardiologist at [facility] health  ? Esophageal stricture   ? Family history of melanoma ]  ? Family history of pancreatic cancer   ? Fatty liver   ? Fatty liver   ? Fibroids   ? Fibromyalgia   ? "in my legs"  ? GERD (gastroesophageal reflux disease)   ? hx hiatal hernia, stricture and gastric ulcer  ? Headache(784.0)   ? migraines  ? Heart murmur   ? Hiatal hernia   ? History of loop recorder   ? History of migraines   ? "dx'd when I was in my teens"  ? Hyperlipemia   ? Hypertension   ? Mental disorder   ? Mild episode of recurrent major depressive disorder ([place]) 12/06/2015  ? Myocardial infarction [facility]) 1980's & 1990;  ? sees Dr. Einar Gip  ?  OSA (obstructive sleep apnea)   ? Personal history of chemotherapy   ? Personal history of radiation therapy   ? Pneumonia   ? multiple times  ? PONV (postoperative nausea and vomiting)   ? Recurrent upper respiratory infection (URI)   ? S/P left TKA 09/25/2016  ? Shortness of breath 11/20/11  ? "all the time", sees pulmonlogy, ? asthma  ? Stenotic cervical os   ? Stomach ulcer   ? "3 small; found in 05/2011"  ? TMJ (dislocation of temporomandibular joint)   ? Tuberculosis   ? + TB SKIN TEST  ? Type 2 diabetes mellitus without complication, without long-term current use of insulin ([place]) 12/06/2015  ? not on meds   ? ?Past Surgical History:  ?Procedure Laterality Date  ? ACHILLES TENDON REPAIR  1970's  ? left ankle  ? ARTHROSCOPIC REPAIR ACL    ? left knee cap  ? BREAST BIOPSY Right 04/08/2013  ? again in October or November 2020  ? BREAST LUMPECTOMY  Left 12/09/2019  ? BREAST LUMPECTOMY WITH RADIOACTIVE SEED AND SENTINEL LYMPH NODE BIOPSY Left 12/09/2019  ? Procedure: LEFT BREAST LUMPECTOMY WITH RADIOACTIVE SEED AND LEFT AXILLARY SENTINEL LYMPH NODE BIOPSY;  Surgeon: Rolm Bookbinder, MD;  Location: [place];  Service: General;  Laterality: Left;  PEC BLOCK  ? CARDIAC CATHETERIZATION    ? loop recorder  ? CARPAL TUNNEL RELEASE  unknown  ? left hand  ? COLONOSCOPY    ? ESOPHAGOGASTRODUODENOSCOPY (EGD) WITH PROPOFOL N/A 10/29/2017  ? Procedure: ESOPHAGOGASTRODUODENOSCOPY (EGD) WITH PROPOFOL;  Surgeon: Irene Shipper, MD;  [facility];  Service: Endoscopy;  Laterality: N/A;  ? LOOP RECORDER IMPLANT    ? PORT-A-CATH REMOVAL N/A 12/09/2019  ? Procedure: REMOVAL PORT-A-CATH;  Surgeon: Rolm Bookbinder, MD;  Location: [place];  Service: General;  Laterality: N/A;  ? PORTACATH PLACEMENT N/A 09/23/2019  ? Procedure: INSERTION PORT-A-CATH Right Internal Doreen Salvage;  Surgeon: Rolm Bookbinder, MD;  Location: [place];  Service: General;  Laterality: N/A;  ? post ganglionectomy  1970's  ? "for migraine  headaches"  ? pouch string  [phone]  ? "did this 3 times (once w/each pregnancy)"  ? SAVORY DILATION N/A 10/29/2017  ? Procedure: SAVORY DILATION;  Surgeon: Irene Shipper, MD;  [facility];  Service: Endoscopy;  Laterality: N/A;  ? TOTAL KNEE ARTHROPLASTY Left 09/25/2016  ? Procedure: LEFT TOTAL KNEE ARTHROPLASTY;  Surgeon: Paralee Cancel, MD;  [facility];  Service: Orthopedics;  Laterality: Left;  ? TUBAL LIGATION  1980's  ? ?Patient Active Problem List  ? Diagnosis Date Noted  ? Spondylosis without myelopathy or radiculopathy, cervical region 04/21/2021  ? Neutropenia with fever ([place]) 10/01/2019  ? Genetic testing 09/17/2019  ? Family history of melanoma   ? Family history of pancreatic cancer   ? Malignant neoplasm of upper-outer quadrant of left breast in female, estrogen receptor negative ([place]) 09/07/2019  ? Encounter for loop recorder at end of battery life 04/24/2018  ? Esophageal stricture 07/01/2017  ? Hyperlipidemia associated with type 2 diabetes mellitus ([place]) 05/20/2017  ? Allergic rhinitis 03/11/2017  ? Dilated cardiomyopathy ([place]) 02/07/2017  ? 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 Morbid obesity due to excess calories ([place]) 09/26/2016  ? Increased endometrial stripe thickness 06/27/2016  ? Intramural leiomyoma of uterus 06/27/2016  ? Type 2 diabetes mellitus without complication, without long-term current use of insulin ([place]) 12/06/2015  ? Hypertension associated with diabetes ([place]) 07/19/2015  ? Arrhythmia 07/19/2015  ? Orthostatic hypotension 07/19/2015  ? GERD (gastroesophageal reflux disease) 07/19/2015  ? Chronic back pain 07/19/2015  ? Neuropathy 07/19/2015  ? Symptomatic PVCs 11/02/2014  ? Syncope 11/02/2014  ? Chest pain 12/23/2013  ? Disorder of cervix 03/10/2013  ? Vaginal atrophy 03/10/2013  ? OSA (obstructive sleep apnea) 07/31/2012  ? Asthma 04/29/2012  ? Coronary artery disease 11/20/2011  ? ? ?PCP: Caren Macadam, MD ? ?REFERRING PROVIDER:  Alda Berthold, DO ? ?REFERRING DIAG: Cervicogenic headache ? ?THERAPY DIAG:  ?Cervicalgia ? ?Cramp and spasm ? ?ONSET DATE: Summer 2022 ? ?SUBJECTIVE:                                                                                                                                                                                                        ? ?  SUBJECTIVE STATEMENT: ?Pt reports she has been having upper neck, mid back, and ram's horn pattern HAs since a fall down 5 steps in her home and hitting her head on a cabinet. She also experiences nausea and dizziness c neck movement to the L. She also reports having auras c HAs. She notes she had HAs as a teenager, but after a posterior ganglionectomy the HAs on the L side of her head resolved while those on the R remained. Additionally, she reports her R knee will give out. ? ?PERTINENT HISTORY:  ?Arthritis, Hx of HAs, DM2, anxiety and depression, angina, Afib, CHF, chronic respiratory failure, Hx MI, cancer, posterior ganglionectomy 1970s, Hx of LBP ? ?PAIN:  ?Are you having pain? Yes: NPRS scale: 6-9/10 ?Pain location: neck and head ?Pain description: upper neck and HA, constant pain ?Aggravating factors: Noise, sunlight ?Relieving factors: Excedrian migraine, sitting down and resting ? ?PRECAUTIONS: None ? ?WEIGHT BEARING RESTRICTIONS No ? ?FALLS:  ?Has patient fallen in last 6 months? Yes. Number of falls 2 ?In addition to above, a fall 2 weeks ago when her foot got caught in the strap of her granddaughter's book bag. ? ?LIVING ENVIRONMENT: ?Lives with: lives with their family ?Lives in: House/apartment ?Stairs: Yes: Internal: 5 steps; none and External: 1 steps; none ?Has following equipment at home: None ?Uses an appliance for hand assist on home steps ? ?OCCUPATION: Disability ? ?PLOF: Independent ? ?PATIENT GOALS To decrease HAs ? ?OBJECTIVE:  ? ?DIAGNOSTIC FINDINGS:  ?Cervical MRI 03/20/21 ?IMPRESSION: ?1. Reactive marrow edema about the left C1-2  articulation due to ?osteoarthritic changes. Finding could serve as a source of patient's ?symptoms. ?2. Left paracentral disc osteophyte complex at C5-6 with resultant ?mild canal and moderate left C6 foraminal stenosis. ?

## 2022-03-02 DIAGNOSIS — Z7984 Long term (current) use of oral hypoglycemic drugs: Secondary | ICD-10-CM

## 2022-03-02 DIAGNOSIS — J45909 Unspecified asthma, uncomplicated: Secondary | ICD-10-CM | POA: Diagnosis not present

## 2022-03-02 DIAGNOSIS — I110 Hypertensive heart disease with heart failure: Secondary | ICD-10-CM | POA: Diagnosis not present

## 2022-03-02 DIAGNOSIS — I2510 Atherosclerotic heart disease of native coronary artery without angina pectoris: Secondary | ICD-10-CM | POA: Diagnosis not present

## 2022-03-02 DIAGNOSIS — I509 Heart failure, unspecified: Secondary | ICD-10-CM | POA: Diagnosis not present

## 2022-03-02 DIAGNOSIS — E1159 Type 2 diabetes mellitus with other circulatory complications: Secondary | ICD-10-CM

## 2022-03-02 DIAGNOSIS — E785 Hyperlipidemia, unspecified: Secondary | ICD-10-CM | POA: Diagnosis not present

## 2022-03-06 ENCOUNTER — Telehealth: Payer: Self-pay | Admitting: Pharmacist

## 2022-03-06 NOTE — Telephone Encounter (Signed)
Already working on patient assistance for her ;) I think she probably will qualify because she has for other meds in the past. We can definitely sample her on it until then. It looks like she picked it up though... ? ?

## 2022-03-06 NOTE — Chronic Care Management (AMB) (Signed)
? ? ?  Chronic Care Management ?Pharmacy Assistant  ? ?Name: Shelby Anderson  MRN: [id number] DOB: 1956-10-06 ? ?Reason for Encounter: Move appointment to April  and do PAP for Jardiance.  ?PAP for Jardiance completed, to be mailed to patient on 03/07/2022. ?Rescheduled appointment to 03/14/2022 with patient   ? ?[name] CMA  ?Clinical Pharmacist Assistant ?[phone] ? ?

## 2022-03-07 ENCOUNTER — Encounter: Payer: Self-pay | Admitting: Podiatry

## 2022-03-07 ENCOUNTER — Ambulatory Visit (INDEPENDENT_AMBULATORY_CARE_PROVIDER_SITE_OTHER): Payer: Medicare Other | Admitting: Podiatry

## 2022-03-07 DIAGNOSIS — B351 Tinea unguium: Secondary | ICD-10-CM

## 2022-03-07 DIAGNOSIS — E1151 Type 2 diabetes mellitus with diabetic peripheral angiopathy without gangrene: Secondary | ICD-10-CM | POA: Diagnosis not present

## 2022-03-07 DIAGNOSIS — L84 Corns and callosities: Secondary | ICD-10-CM

## 2022-03-12 NOTE — Progress Notes (Signed)
?Subjective:  ?Patient ID: Shelby Anderson, female    DOB: 06-18-1956,  MRN: [id number] ? ?Shelby Anderson presents to clinic today for at risk foot care. Pt has h/o NIDDM with PAD and callus(es) b/l lower extremities and painful thick toenails that are difficult to trim. Painful toenails interfere with ambulation. Aggravating factors include wearing enclosed shoe gear. Pain is relieved with periodic professional debridement. Painful calluses are aggravated when weightbearing with and without shoegear. Pain is relieved with periodic professional debridement. ? ?Patient states blood glucose was 109 mg/dl today.  Last HgA1c was 7.6%. ? ?New problem(s): None.  ? ?PCP is Shelby Macadam, MD , and last visit was February 05, 2022. ? ?Allergies  ?Allergen Reactions  ? Lodine [Etodolac] Anaphylaxis, Hives and Swelling  ? Oxycontin [Oxycodone Hcl] Anaphylaxis  ?  hives, trouble breathing, tongue swelling (Only Oxycontin) Tolerates plain oxycodone.  ? Penicillins Anaphylaxis  ?  Told by a surgeon never to take it again. ?Has patient had a PCN reaction causing immediate rash, facial/tongue/throat swelling, SOB or lightheadedness with hypotension: Yes ?Has patient had a PCN reaction causing severe rash involving mucus membranes or skin necrosis: Unknown ?Has patient had a PCN reaction that required hospitalization: No ?Has patient had a PCN reaction occurring within the last 10 years: No ?If all of the above answers are "NO", then may proceed with Cephalosporin use.  ? Aspirin Other (See Comments)  ?  High-dose caused GI Bleeds  ? Darvocet [Propoxyphene N-Acetaminophen] Hives  ? Nitroglycerin Other (See Comments)  ?  IV-BP drops dramatically ?Can take po  ? Tramadol Hives and Itching  ? Ultram [Tramadol Hcl] Hives  ? Valium Other (See Comments)  ?  Circulation problems. "Legs turned black".  ? ? ?Review of Systems: Negative except as noted in the HPI. ? ?Objective: No changes noted in today's physical  examination. ?Shelby Anderson is a pleasant 66 y.o. female in NAD. AAO X 3. ? ?Vascular Examination: ?CFT <3 seconds b/l LE. Faintly palpable DP pulses b/l. Nonpalpable PT pulses b/l. Pedal hair absent b/l. Skin temperature gradient WNL b/l. No pain with calf compression b/l. No edema b/l LE. No cyanosis or clubbing noted b/l LE. ? ?Dermatological Examination: ?Pedal integument with normal turgor, texture and tone b/l LE. No open wounds b/l. No interdigital macerations b/l. Toenails 2-5 bilaterally elongated, thickened, discolored with subungual debris. +Tenderness with dorsal palpation of nailplates.Anonychia noted bilateral great toes. Nailbed(s) epithelialized.  Hyperkeratotic lesion(s) submet head 5 b/l.  No erythema, no edema, no drainage, no fluctuance.  ?Musculoskeletal Examination: ?Normal muscle strength 5/5 to all lower extremity muscle groups bilaterally. No pain, crepitus or joint limitation noted with ROM b/l LE. No gross bony pedal deformities b/l. Patient ambulates independently without assistive aids. ? ?Neurological Examination: ?Pt has subjective symptoms of neuropathy. Protective sensation intact 5/5 intact bilaterally with 10g monofilament b/l. ? ?  Latest Ref Rng & Units 01/25/2022  ?  1:11 PM  ?Hemoglobin A1C  ?Hemoglobin-A1c 4.6 - 6.5 % 7.5    ? ?Assessment/Plan: ?1. Onychomycosis   ?2. Callus   ?3. Diabetes mellitus type 2 with peripheral artery disease ([place])   ?  ?-Examined patient. ?-Continue diabetic foot care principles: inspect feet daily, monitor glucose as recommended by PCP and/or Endocrinologist, and follow prescribed diet per PCP, Endocrinologist and/or dietician. ?-Toenails 2-5 bilaterally debrided in length and girth without iatrogenic bleeding with sterile nail nipper and dremel.  ?-Callus(es) submet head 5 b/l pared utilizing sterile scalpel blade without complication or  incident. Total number debrided =2. ?-Patient/POA to call should there be question/concern in the  interim.  ? ?Return in about 3 months (around 06/06/2022). ? ?Shelby Anderson, DPM  ?

## 2022-03-13 MED ORDER — EMPAGLIFLOZIN 25 MG PO TABS: 25.0000 mg | ORAL_TABLET | Freq: Every day | ORAL | 0 refills | Status: DC

## 2022-03-13 NOTE — Therapy (Signed)
?OUTPATIENT PHYSICAL THERAPY TREATMENT NOTE ? ? ?Patient Name: Shelby Anderson ?MRN: [id number] ?DOB:06/30/1956, 66 y.o., female ?Today's Date: 03/15/2022 ? ?PCP: Caren Macadam, MD ?REFERRING PROVIDER: Alda Berthold, DO ? ?END OF SESSION:  ? PT End of Session - 03/14/22 1421   ? ? Visit Number 2   ? Number of Visits 13   ? Date for PT Re-Evaluation 04/20/22   ? Authorization Type UHC MEDICARE   ? Progress Note Due on Visit 10   ? PT Start Time 1420   ? PT Stop Time 1505   ? PT Time Calculation (min) 45 min   ? Activity Tolerance Patient tolerated treatment well   ? Behavior During Therapy [facility] for tasks assessed/performed   ? ?  ?  ? ?  ? ? ?Past Medical History:  ?Diagnosis Date  ? Allergy   ? Anemia   ? "chronic"  ? Angina   ? Prinzmetal angina  ? Anxiety and depression   ? Arthritis   ? Asthma   ? Atrial fibrillation ([place])   ? h/o "AF w/frequent PVCs"  ? Breast cancer ([place])   ? Left  ? Cancer [facility])   ? hx of skin cancer   ? CHF (congestive heart failure) ([place])   ? Chronic back pain greater than 3 months duration   ? on chronic narcotics, treated at pain clinic  ? Chronic respiratory failure ([place]) 09/15/2015  ? ONO 09/04/15 + desats >begin O2 at 2l/ m   ? Colon polyps   ? hyperplastic  ? Coronary artery disease   ? Arrythmia, orthostatic hypotension, HLD, HTN; sees Dr. Einar Gip  ? Difficult intubation   ? "TMJ & woke up when they were still cutting on me"  ? Dysrhythmia   ? sees Dr. Einar Gip and a cardiologist at [facility] health  ? Esophageal stricture   ? Family history of melanoma ]  ? Family history of pancreatic cancer   ? Fatty liver   ? Fatty liver   ? Fibroids   ? Fibromyalgia   ? "in my legs"  ? GERD (gastroesophageal reflux disease)   ? hx hiatal hernia, stricture and gastric ulcer  ? Headache(784.0)   ? migraines  ? Heart murmur   ? Hiatal hernia   ? History of loop recorder   ? History of migraines   ? "dx'd when I was in my teens"  ? Hyperlipemia   ? Hypertension   ? Mental disorder   ? Mild episode of  recurrent major depressive disorder ([place]) 12/06/2015  ? Myocardial infarction [facility]) 1980's & 1990;  ? sees Dr. Einar Gip  ? OSA (obstructive sleep apnea)   ? Personal history of chemotherapy   ? Personal history of radiation therapy   ? Pneumonia   ? multiple times  ? PONV (postoperative nausea and vomiting)   ? Recurrent upper respiratory infection (URI)   ? S/P left TKA 09/25/2016  ? Shortness of breath 11/20/11  ? "all the time", sees pulmonlogy, ? asthma  ? Stenotic cervical os   ? Stomach ulcer   ? "3 small; found in 05/2011"  ? TMJ (dislocation of temporomandibular joint)   ? Tuberculosis   ? + TB SKIN TEST  ? Type 2 diabetes mellitus without complication, without long-term current use of insulin ([place]) 12/06/2015  ? not on meds   ? ?Past Surgical History:  ?Procedure Laterality Date  ? ACHILLES TENDON REPAIR  1970's  ? left ankle  ? ARTHROSCOPIC REPAIR ACL    ?  left knee cap  ? BREAST BIOPSY Right 04/08/2013  ? again in October or November 2020  ? BREAST LUMPECTOMY Left 12/09/2019  ? BREAST LUMPECTOMY WITH RADIOACTIVE SEED AND SENTINEL LYMPH NODE BIOPSY Left 12/09/2019  ? Procedure: LEFT BREAST LUMPECTOMY WITH RADIOACTIVE SEED AND LEFT AXILLARY SENTINEL LYMPH NODE BIOPSY;  Surgeon: Rolm Bookbinder, MD;  Location: [place];  Service: General;  Laterality: Left;  PEC BLOCK  ? CARDIAC CATHETERIZATION    ? loop recorder  ? CARPAL TUNNEL RELEASE  unknown  ? left hand  ? COLONOSCOPY    ? ESOPHAGOGASTRODUODENOSCOPY (EGD) WITH PROPOFOL N/A 10/29/2017  ? Procedure: ESOPHAGOGASTRODUODENOSCOPY (EGD) WITH PROPOFOL;  Surgeon: Irene Shipper, MD;  [facility];  Service: Endoscopy;  Laterality: N/A;  ? LOOP RECORDER IMPLANT    ? PORT-A-CATH REMOVAL N/A 12/09/2019  ? Procedure: REMOVAL PORT-A-CATH;  Surgeon: Rolm Bookbinder, MD;  Location: [place];  Service: General;  Laterality: N/A;  ? PORTACATH PLACEMENT N/A 09/23/2019  ? Procedure: INSERTION PORT-A-CATH Right Internal Doreen Salvage;  Surgeon: Rolm Bookbinder, MD;  Location: [place];  Service: General;  Laterality: N/A;  ? post ganglionectomy  1970's  ? "for migraine headaches"  ? pouch string  [phone]  ? "did this 3 times (once w/each pregnancy)"  ? SAVORY DILATION N/A 10/29/2017  ? Procedure: SAVORY DILATION;  Surgeon: Irene Shipper, MD;  [facility];  Service: Endoscopy;  Laterality: N/A;  ? TOTAL KNEE ARTHROPLASTY Left 09/25/2016  ? Procedure: LEFT TOTAL KNEE ARTHROPLASTY;  Surgeon: Paralee Cancel, MD;  [facility];  Service: Orthopedics;  Laterality: Left;  ? TUBAL LIGATION  1980's  ? ?Patient Active Problem List  ? Diagnosis Date Noted  ? Spondylosis without myelopathy or radiculopathy, cervical region 04/21/2021  ? Neutropenia with fever ([place]) 10/01/2019  ? Genetic testing 09/17/2019  ? Family history of melanoma   ? Family history of pancreatic cancer   ? Malignant neoplasm of upper-outer quadrant of left breast in female, estrogen receptor negative ([place]) 09/07/2019  ? Encounter for loop recorder at end of battery life 04/24/2018  ? Esophageal stricture 07/01/2017  ? Hyperlipidemia associated with type 2 diabetes mellitus ([place]) 05/20/2017  ? Allergic rhinitis 03/11/2017  ? Dilated cardiomyopathy ([place]) 02/07/2017  ? 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 Morbid obesity due to excess calories ([place]) 09/26/2016  ? Increased endometrial stripe thickness 06/27/2016  ? Intramural leiomyoma of uterus 06/27/2016  ? Type 2 diabetes mellitus without complication, without long-term current use of insulin ([place]) 12/06/2015  ? Hypertension associated with diabetes ([place]) 07/19/2015  ? Arrhythmia 07/19/2015  ? Orthostatic hypotension 07/19/2015  ? GERD (gastroesophageal reflux disease) 07/19/2015  ? Chronic back pain 07/19/2015  ? Neuropathy 07/19/2015  ? Symptomatic PVCs 11/02/2014  ? Syncope 11/02/2014  ? Chest pain 12/23/2013  ? Disorder of cervix 03/10/2013  ? Vaginal atrophy 03/10/2013  ? OSA (obstructive sleep apnea) 07/31/2012  ?  Asthma 04/29/2012  ? Coronary artery disease 11/20/2011  ? ? ?REFERRING DIAG: Cervicogenic headache ? ?THERAPY DIAG:  ?Cervicalgia ? ?Cramp and spasm ? ?SUBJECTIVE:                                                                                                                                                                                                        ?  ?  SUBJECTIVE STATEMENT: ?Pt reports her L neck pain and HAs have both improved occurring less frequently. ?   ?PAIN:  ?Are you having pain? Yes: NPRS scale: 5-9/10 with decreased frequency of pain at higher levels ?Pain location: neck and head ?Pain description: upper neck and HA, constant pain ?Aggravating factors: Noise, sunlight ?Relieving factors: Excedrian migraine, sitting down and resting ? ?PERTINENT HISTORY:  ?Arthritis, Hx of HAs, DM2, anxiety and depression, angina, Afib, CHF, chronic respiratory failure, Hx MI, cancer, posterior ganglionectomy 1970s, Hx of LBP ?  ?PRECAUTIONS: None ?  ?WEIGHT BEARING RESTRICTIONS No ?  ?OCCUPATION: Disability ?  ?PLOF: Independent ?  ?PATIENT GOALS To decrease HAs ?  ?OBJECTIVE:  ?  ?DIAGNOSTIC FINDINGS:  ?Cervical MRI 03/20/21 ?IMPRESSION: ?1. Reactive marrow edema about the left C1-2 articulation due to ?osteoarthritic changes. Finding could serve as a source of patient's ?symptoms. ?2. Left paracentral disc osteophyte complex at C5-6 with resultant ?mild canal and moderate left C6 foraminal stenosis. ?3. Moderate left-sided facet hypertrophy at C3-4 with resultant mild ?left C4 foraminal stenosis. ?4. 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 ?  ?PATIENT SURVEYS:  ?FOTO TBA ?  ?COGNITION: ?Overall cognitive status: Within functional limits for tasks assessed ?  ?SENSATION: ?WFL ?  ?POSTURE:  ?Forward head c CT step off and neck hump, rounded shoulders ?  ?PALPATION: ?TTP of the bilat upper trap, levator, cervical paraspinals and suboccipitals L>R ?  ?CERVICAL ROM:  ?  ?Active ROM A/PROM  (deg) ?03/01/2022  ?Flexion 40 sharp L pain on return  ?Extension 18 sharp pain L  ?Right lateral flexion 20 pulling pain L  ?Left lateral flexion 18 sharp pain L  ?Right rotation 40 no pain  ?Left rotation 30 sharp pain

## 2022-03-13 NOTE — Addendum Note (Signed)
Addended by: Agnes Lawrence on: 03/13/2022 08:38 AM ? ? Modules accepted: Orders ? ?

## 2022-03-14 ENCOUNTER — Ambulatory Visit: Payer: Medicare Other | Admitting: Student

## 2022-03-14 ENCOUNTER — Ambulatory Visit (INDEPENDENT_AMBULATORY_CARE_PROVIDER_SITE_OTHER): Payer: Medicare Other | Admitting: Pharmacist

## 2022-03-14 ENCOUNTER — Ambulatory Visit: Payer: Medicare Other | Attending: Neurology

## 2022-03-14 VITALS — BP 132/70

## 2022-03-14 DIAGNOSIS — R252 Cramp and spasm: Secondary | ICD-10-CM

## 2022-03-14 DIAGNOSIS — E1169 Type 2 diabetes mellitus with other specified complication: Secondary | ICD-10-CM

## 2022-03-14 DIAGNOSIS — M542 Cervicalgia: Secondary | ICD-10-CM

## 2022-03-14 DIAGNOSIS — E119 Type 2 diabetes mellitus without complications: Secondary | ICD-10-CM

## 2022-03-14 NOTE — Progress Notes (Signed)
? ?Chronic Care Management ?Pharmacy Note ? ?03/14/2022 ?Name:  Shelby Anderson MRN:  [id number] DOB:  [birth year] ? ?Summary: ?Pt has trouble affording her medications ?A1c not at goal < 7% ? ?Recommendations/Changes made from today's visit: ?-Recommend checking expiration date on test strips ?-Recommend switching metformin and Jardiance to Synjardy to minimize pill burden ?-Transition to [facility] for medications to be filled simultaneously ?-Recommended GI follow up for diarrhea and blood in stool ?-Adjusted medication schedule for morning meds ? ?Plan: ?Apply for PAP for Symbicort and Jardiance ?BP and DM assessment in 2 months ? ? ?Subjective: ?Shelby Anderson is an 66 y.o. year old female who is a primary patient of Koberlein, Steele Berg, MD.  The CCM team was consulted for assistance with disease management and care coordination needs.   ? ?Engaged with patient face to face for follow up visit in response to provider referral for pharmacy case management and/or care coordination services.  ? ?Consent to Services:  ?The patient was given information about Chronic Care Management services, agreed to services, and gave verbal consent prior to initiation of services.  Please see initial visit note for detailed documentation.  ? ?Patient Care Team: ?Caren Macadam, MD as PCP - General (Family Medicine) ?Rigoberto Noel, MD as Consulting Physician (Pulmonary Disease) ?Adrian Prows, MD as Consulting Physician (Cardiology) ?Lenon Oms, MD as Referring Physician (Obstetrics and Gynecology) ?Veneda Melter, MD as Referring Physician (Cardiology) ?[name], MD as Consulting Physician (Anesthesiology) ?Rolm Bookbinder, MD as Consulting Physician (General Surgery) ?Nicholas Lose, MD as Consulting Physician (Hematology and Oncology) ?Kyung Rudd, MD as Consulting Physician (Radiation Oncology) ?Alda Berthold, DO as Consulting Physician (Neurology) ?Jarome Matin, MD as Consulting  Physician (Dermatology) ?Viona Gilmore, [facility] as Pharmacist (Pharmacist) ?Dimitri Ped, RN as Case Manager ? ?Recent office visits: ?02/05/22 [name] MD: Patient presented for follow up for lab work. Sampled Jardiance 10 mg and plan to start. Plan for follow up in 8 weeks for lab visit. Prescribed lisinopril 5 mg daily. ? ?01/12/22 [name] MD: Patient presented for video visit for HTN. Prescribed molnupiraivir based on recent COVID exposure. Patient must confirm with positive test. ? ?Recent consult visits: ?03/07/22 Acquanetta Sit DPM (Podiatry) -  seen for onychomycosis and routine foot care. No medication changes and follow up in 3 months. ? ?02/28/22 Gar Ponto, PT (outpatient rehab): Patient presented for initial PT evaluation for cervicalgia. ? ?01/08/22 Narda Amber, DO (neurology): Patient presented for cervicogenic headache. Referred to PT. ? ?12/22/21 Nicholas Lose, MD (hem/onc): Patient presented for breast cancer follow up. Follow up in 1 year. ? ?12/06/21 Acquanetta Sit DPM (Podiatry) -  seen for onychomycosis and routine foot care. No medication changes and follow up in 3 months. ? ?11/30/21 [name] (pain medicine): Patient presented for follow up. Unable to access notes. ? ?11/03/21 Hardie Pulley, DPM (podiatry): Patient presented for ingrown toe nail follow up. ? ?09/27/21 Adrian Prows, MD (Cardiology): Patient presented for myocardial imaging. ? ?09/12/21 [name] PA-C (Cardiology): Patient presented for hypertension follow up. Prescribed hydralazine 25 mg PRN. ?  ?05/04/21 Tye Savoy NP (Gastroenterology) - seen for special screening for malignant neoplasms. Patient started on oral colonoscopy drink. Promethazine changed to just at bedtime daily. Follow up with PCP. ?  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 No medication changes. Follow up in 3 months.  ?  ?02/01/21 [name] MD  (Pulmonology) - seen  for mild persistent asthma. No medication changes. Follow up in 6 months.  ? ? ?Hospital visits: ?None in previous 6 months ? ? ?Objective: ? ?Lab Results  ?Component Value Date  ? CREATININE 0.79 01/25/2022  ? BUN 16 01/25/2022  ? GFR 79.36 01/25/2022  ? GFRNONAA >60 12/08/2019  ? GFRAA >60 12/08/2019  ? NA 140 01/25/2022  ? K 4.1 01/25/2022  ? CALCIUM 9.5 01/25/2022  ? CO2 31 01/25/2022  ? GLUCOSE 135 (H) 01/25/2022  ? ? ?Lab Results  ?Component Value Date/Time  ? HGBA1C 7.5 (H) 01/25/2022 01:11 PM  ? HGBA1C 7.4 (H) 02/27/2021 01:00 PM  ? HGBA1C 5.9 01/12/2017 12:00 AM  ? GFR 79.36 01/25/2022 01:11 PM  ? GFR 94.01 02/27/2021 01:00 PM  ? MICROALBUR 7.7 (H) 01/25/2022 01:11 PM  ? MICROALBUR 1.1 02/27/2021 01:00 PM  ?  ?Last diabetic Eye exam:  ?Lab Results  ?Component Value Date/Time  ? HMDIABEYEEXA No Retinopathy 07/22/2018 12:00 AM  ?  ?Last diabetic Foot exam: No results found for: HMDIABFOOTEX  ? ?Lab Results  ?Component Value Date  ? CHOL 127 01/25/2022  ? HDL 35.80 (L) 01/25/2022  ? [place] 62 01/25/2022  ? TRIG 146.0 01/25/2022  ? CHOLHDL 4 01/25/2022  ? ? ? ?  Latest Ref Rng & Units 01/25/2022  ?  1:11 PM 02/27/2021  ?  1:00 PM 09/30/2020  ?  3:33 PM  ?Hepatic Function  ?Total Protein 6.0 - 8.3 g/dL 7.3   6.5   7.5    ?Albumin 3.5 - 5.2 g/dL 4.2   3.7     ?AST 0 - 37 U/L _0 ?ALT 0 - 35 U/L _1 ?Alk Phosphatase 39 - 117 U/L 76   68     ?Total Bilirubin 0.2 - 1.2 mg/dL 0.5   0.4   0.5    ? ? ?Lab Results  ?Component Value Date/Time  ? TSH 9.47 (H) 01/25/2022 01:11 PM  ? TSH 2.99 12/07/2020 02:33 PM  ? FREET4 1.2 09/30/2020 03:33 PM  ? FREET4 0.81 04/10/2016 03:11 PM  ? ? ? ?  Latest Ref Rng & Units 01/25/2022  ?  1:11 PM 02/27/2021  ?  1:00 PM 09/30/2020  ?  3:33 PM  ?CBC  ?WBC 4.0 - 10.5 K/uL 8.0   5.5   5.5    ?Hemoglobin 12.0 - 15.0 g/dL 13.3   12.6   13.7    ?Hematocrit 36.0 - 46.0 % 40.3   38.1   42.8    ?Platelets 150.0 - 400.0 K/uL 271.0   222.0   271     ? ? ?Lab Results  ?Component Value Date/Time  ? VD25OH 57 09/30/2020 03:33 PM  ? ? ?Clinical ASCVD: Yes  ?The ASCVD Risk score (Arnett DK, et al., 2019) failed to calculate for the following reasons: ?  The patient has a prior MI or stroke diagnosis   ? ? ?  02/05/2022  ? 11:14 AM 01/29/2022  ?  1:26 PM 07/18/2021  ?  3:35 PM  ?Depression screen PHQ 2/9  ?Decreased Interest 1 0 0  ?Down, Depressed, Hopeless _2 ?PHQ - 2 Score _3 ?Altered sleeping 2    ?Tired, decreased energy 2    ?Change in appetite 1    ?Feeling bad or failure about yourself  0    ?Trouble concentrating 0    ?  Moving slowly or fidgety/restless 0    ?Suicidal thoughts 0    ?PHQ-9 Score 7    ?  ? ? ?Social History  ? ?Tobacco Use  ?Smoking Status Never  ?Smokeless Tobacco Never  ? ?BP Readings from Last 3 Encounters:  ?02/26/22 132/70  ?02/05/22 122/82  ?01/08/22 108/65  ? ?Pulse Readings from Last 3 Encounters:  ?02/05/22 70  ?01/08/22 95  ?12/22/21 64  ? ?Wt Readings from Last 3 Encounters:  ?02/05/22 223 lb 12.8 oz (101.5 kg)  ?01/08/22 221 lb (100.2 kg)  ?12/22/21 221 lb 9.6 oz (100.5 kg)  ? ?BMI Readings from Last 3 Encounters:  ?02/05/22 36.12 kg/m?  ?01/08/22 35.67 kg/m?  ?12/22/21 35.77 kg/m?  ? ? ?Assessment/Interventions: Review of patient past medical history, allergies, medications, health status, including review of consultants reports, laboratory and other test data, was performed as part of comprehensive evaluation and provision of chronic care management services.  ? ?SDOH:  (Social Determinants of Health) assessments and interventions performed: Yes ? ? ?SDOH Screenings  ? ?Alcohol Screen: Not on file  ?Depression (PHQ2-9): Medium Risk  ? PHQ-2 Score: 7  ?Financial Resource Strain: High Risk  ? Difficulty of Paying Living Expenses: Hard  ?Food Insecurity: Food Insecurity Present  ? Worried About Charity fundraiser in the Last Year: Sometimes true  ? Ran Out of Food in the Last Year: Sometimes true  ?Housing: Low Risk   ? Last  Housing Risk Score: 0  ?Physical Activity: Sufficiently Active  ? Days of Exercise per Week: 5 days  ? Minutes of Exercise per Session: 60 min  ?Social Connections: Moderately Isolated  ? Frequency of Communication wi

## 2022-03-14 NOTE — Patient Instructions (Signed)

## 2022-03-14 NOTE — Patient Instructions (Addendum)
You can take the levothyroxine at 7:30am-8am when you first wake up  ?Then take Nexium at least 2 hours later around 9:30-10am with other medications ?Then take Carafate 4 hours after levothyroxine - starting around noon ? ?Maddie ?[name], PharmD, BCACP ?Clinical Pharmacist ?Therapist, [facility] ?[phone] ?

## 2022-03-16 ENCOUNTER — Ambulatory Visit: Payer: Medicare Other

## 2022-03-16 DIAGNOSIS — M542 Cervicalgia: Secondary | ICD-10-CM

## 2022-03-16 DIAGNOSIS — R252 Cramp and spasm: Secondary | ICD-10-CM

## 2022-03-16 NOTE — Therapy (Signed)
?OUTPATIENT PHYSICAL THERAPY TREATMENT NOTE ? ? ?Patient Name: Shelby Anderson ?MRN: [id number] ?DOB:1956-11-16, 66 y.o., female ?Today's Date: 03/16/2022 ? ?PCP: Caren Macadam, MD ?REFERRING PROVIDER: Caren Macadam, MD ? ?END OF SESSION:  ? PT End of Session - 03/16/22 1520   ? ? Visit Number 3   ? Number of Visits 13   ? Date for PT Re-Evaluation 04/20/22   ? Authorization Type [employer] MEDICARE   ? Progress Note Due on Visit 10   ? PT Start Time 1315   ? PT Stop Time 1400   ? PT Time Calculation (min) 45 min   ? Activity Tolerance Patient tolerated treatment well   ? Behavior During Therapy [facility] for tasks assessed/performed   ? ?  ?  ? ?  ? ? ? ?Past Medical History:  ?Diagnosis Date  ? Allergy   ? Anemia   ? "chronic"  ? Angina   ? Prinzmetal angina  ? Anxiety and depression   ? Arthritis   ? Asthma   ? Atrial fibrillation ([place])   ? h/o "AF w/frequent PVCs"  ? Breast cancer ([place])   ? Left  ? Cancer [facility])   ? hx of skin cancer   ? CHF (congestive heart failure) ([place])   ? Chronic back pain greater than 3 months duration   ? on chronic narcotics, treated at pain clinic  ? Chronic respiratory failure ([place]) 09/15/2015  ? ONO 09/04/15 + desats >begin O2 at 2l/ m   ? Colon polyps   ? hyperplastic  ? Coronary artery disease   ? Arrythmia, orthostatic hypotension, HLD, HTN; sees Dr. Einar Gip  ? Difficult intubation   ? "TMJ & woke up when they were still cutting on me"  ? Dysrhythmia   ? sees Dr. Einar Gip and a cardiologist at [facility]  ? Esophageal stricture   ? Family history of melanoma ]  ? Family history of pancreatic cancer   ? Fatty liver   ? Fatty liver   ? Fibroids   ? Fibromyalgia   ? "in my legs"  ? GERD (gastroesophageal reflux disease)   ? hx hiatal hernia, stricture and gastric ulcer  ? Headache(784.0)   ? migraines  ? Heart murmur   ? Hiatal hernia   ? History of loop recorder   ? History of migraines   ? "dx'd when I was in my teens"  ? Hyperlipemia   ? Hypertension   ? Mental disorder   ? Mild  episode of recurrent major depressive disorder ([place]) 12/06/2015  ? Myocardial infarction [facility]) 1980's & 1990;  ? sees Dr. Einar Gip  ? OSA (obstructive sleep apnea)   ? Personal history of chemotherapy   ? Personal history of radiation therapy   ? Pneumonia   ? multiple times  ? PONV (postoperative nausea and vomiting)   ? Recurrent upper respiratory infection (URI)   ? S/P left TKA 09/25/2016  ? Shortness of breath 11/20/11  ? "all the time", sees pulmonlogy, ? asthma  ? Stenotic cervical os   ? Stomach ulcer   ? "3 small; found in 05/2011"  ? TMJ (dislocation of temporomandibular joint)   ? Tuberculosis   ? + TB SKIN TEST  ? Type 2 diabetes mellitus without complication, without long-term current use of insulin ([place]) 12/06/2015  ? not on meds   ? ?Past Surgical History:  ?Procedure Laterality Date  ? ACHILLES TENDON REPAIR  1970's  ? left ankle  ? ARTHROSCOPIC REPAIR  ACL    ? left knee cap  ? BREAST BIOPSY Right 04/08/2013  ? again in October or November 2020  ? BREAST LUMPECTOMY Left 12/09/2019  ? BREAST LUMPECTOMY WITH RADIOACTIVE SEED AND SENTINEL LYMPH NODE BIOPSY Left 12/09/2019  ? Procedure: LEFT BREAST LUMPECTOMY WITH RADIOACTIVE SEED AND LEFT AXILLARY SENTINEL LYMPH NODE BIOPSY;  Surgeon: Rolm Bookbinder, MD;  Location: [place];  Service: General;  Laterality: Left;  PEC BLOCK  ? CARDIAC CATHETERIZATION    ? loop recorder  ? CARPAL TUNNEL RELEASE  unknown  ? left hand  ? COLONOSCOPY    ? ESOPHAGOGASTRODUODENOSCOPY (EGD) WITH PROPOFOL N/A 10/29/2017  ? Procedure: ESOPHAGOGASTRODUODENOSCOPY (EGD) WITH PROPOFOL;  Surgeon: Irene Shipper, MD;  Location: [facility];  Service: Endoscopy;  Laterality: N/A;  ? LOOP RECORDER IMPLANT    ? PORT-A-CATH REMOVAL N/A 12/09/2019  ? Procedure: REMOVAL PORT-A-CATH;  Surgeon: Rolm Bookbinder, MD;  Location: [place];  Service: General;  Laterality: N/A;  ? PORTACATH PLACEMENT N/A 09/23/2019  ? Procedure: INSERTION PORT-A-CATH Right Internal Doreen Salvage;  Surgeon:  Rolm Bookbinder, MD;  Location: [place];  Service: General;  Laterality: N/A;  ? post ganglionectomy  1970's  ? "for migraine headaches"  ? pouch string  [phone]  ? "did this 3 times (once w/each pregnancy)"  ? SAVORY DILATION N/A 10/29/2017  ? Procedure: SAVORY DILATION;  Surgeon: Irene Shipper, MD;  Location[facility];  Service: Endoscopy;  Laterality: N/A;  ? TOTAL KNEE ARTHROPLASTY Left 09/25/2016  ? Procedure: LEFT TOTAL KNEE ARTHROPLASTY;  Surgeon: Paralee Cancel, MD;  Location: [facility];  Service: Orthopedics;  Laterality: Left;  ? TUBAL LIGATION  1980's  ? ?Patient Active Problem List  ? Diagnosis Date Noted  ? Spondylosis without myelopathy or radiculopathy, cervical region 04/21/2021  ? Neutropenia with fever ([place]) 10/01/2019  ? Genetic testing 09/17/2019  ? Family history of melanoma   ? Family history of pancreatic cancer   ? Malignant neoplasm of upper-outer quadrant of left breast in female, estrogen receptor negative ([place]) 09/07/2019  ? Encounter for loop recorder at end of battery life 04/24/2018  ? Esophageal stricture 07/01/2017  ? Hyperlipidemia associated with type 2 diabetes mellitus ([address]) 05/20/2017  ? Allergic rhinitis 03/11/2017  ? Dilated cardiomyopathy ([place]) 02/07/2017  ? 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 Morbid obesity due to excess calories ([place]) 09/26/2016  ? Increased endometrial stripe thickness 06/27/2016  ? Intramural leiomyoma of uterus 06/27/2016  ? Type 2 diabetes mellitus without complication, without long-term current use of insulin ([place]) 12/06/2015  ? Hypertension associated with diabetes ([place]) 07/19/2015  ? Arrhythmia 07/19/2015  ? Orthostatic hypotension 07/19/2015  ? GERD (gastroesophageal reflux disease) 07/19/2015  ? Chronic back pain 07/19/2015  ? Neuropathy 07/19/2015  ? Symptomatic PVCs 11/02/2014  ? Syncope 11/02/2014  ? Chest pain 12/23/2013  ? Disorder of cervix 03/10/2013  ? Vaginal atrophy 03/10/2013  ? OSA (obstructive sleep apnea)  07/31/2012  ? Asthma 04/29/2012  ? Coronary artery disease 11/20/2011  ? ? ?REFERRING DIAG: Cervicogenic headache ? ?THERAPY DIAG:  ?Cervicalgia ? ?Cramp and spasm ? ?SUBJECTIVE:                                                                                                                                                                                                        ?  ?  SUBJECTIVE STATEMENT: ?My neck pain and ROM are both improved. I'm able to change lanes better when driving. ?   ?PAIN:  ?Are you having pain? Yes: NPRS scale: 0/10 at rest, 5/10 with tightness at the end range of motion.  ?Pain location: neck and head ?Pain description: upper neck and HA, constant pain ?Aggravating factors: Noise, sunlight ?Relieving factors: [place] migraine, sitting down and resting ? ?PERTINENT HISTORY:  ?Arthritis, Hx of HAs, DM2, anxiety and depression, angina, Afib, CHF, chronic respiratory failure, Hx MI, cancer, posterior ganglionectomy 1970s, Hx of LBP ?  ?PRECAUTIONS: None ?  ?WEIGHT BEARING RESTRICTIONS No ?  ?OCCUPATION: Disability ?  ?PLOF: Independent ?  ?PATIENT GOALS To decrease HAs ?  ?OBJECTIVE:  ?  ?DIAGNOSTIC FINDINGS:  ?Cervical MRI 03/20/21 ?IMPRESSION: ?1. Reactive marrow edema about the left C1-2 articulation due to ?osteoarthritic changes. Finding could serve as a source of patient's ?symptoms. ?2. Left paracentral disc osteophyte complex at C5-6 with resultant ?mild canal and moderate left C6 foraminal stenosis. ?3. Moderate left-sided facet hypertrophy at C3-4 with resultant mild ?left C4 foraminal stenosis. ?4. 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 ?  ?PATIENT SURVEYS:  ?FOTO TBA ?  ?COGNITION: ?Overall cognitive status: Within functional limits for tasks assessed ?  ?SENSATION: ?WFL ?  ?POSTURE:  ?Forward head c CT step off and neck hump, rounded shoulders ?  ?PALPATION: ?TTP of the bilat upper trap, levator, cervical paraspinals and suboccipitals L>R ?  ?CERVICAL ROM:  ?   ?Active ROM A/PROM (deg) ?03/01/2022 AROM ?03/16/22  ?Flexion 40 sharp L pain on return   ?Extension 18 sharp pain L   ?Right lateral flexion 20 pulling pain L   ?Left lateral flexion 18 sharp pain L   ?Right rotation 40 no

## 2022-03-18 DIAGNOSIS — J45909 Unspecified asthma, uncomplicated: Secondary | ICD-10-CM | POA: Diagnosis not present

## 2022-03-18 DIAGNOSIS — G4733 Obstructive sleep apnea (adult) (pediatric): Secondary | ICD-10-CM | POA: Diagnosis not present

## 2022-03-19 ENCOUNTER — Encounter: Payer: Self-pay | Admitting: Student

## 2022-03-19 ENCOUNTER — Ambulatory Visit: Payer: Medicare Other | Admitting: Student

## 2022-03-19 VITALS — BP 117/74 | HR 74 | Temp 97.8°F | Resp 17 | Ht 66.0 in | Wt 217.8 lb

## 2022-03-19 DIAGNOSIS — I951 Orthostatic hypotension: Secondary | ICD-10-CM

## 2022-03-19 DIAGNOSIS — I10 Essential (primary) hypertension: Secondary | ICD-10-CM | POA: Diagnosis not present

## 2022-03-19 NOTE — Progress Notes (Signed)
? ?Primary Physician/Referring:  Caren Macadam, MD ? ?Patient ID: Shelby Anderson, female    DOB: 1955-12-16, 66 y.o.   MRN: [id number] ? ?Chief Complaint  ?Patient presents with  ? Hypertension  ? CHRONIC CHEST PAIN  ?  6 MONTH  ? ?HPI:   ? ?Shelby Anderson  is a 66 y.o. obese Caucasian female with history of NSTEMI with normal coronary arteries, diabetes mellitus, orthostatic hypotension, chronic chest pain syndrome, recurrent syncope status post loop recorder implantation by [facility] cardiology.  She also has history of chronic severe back pain, bronchial asthma and severe restrictive lung disease, severe GERD, distal esophageal stricture status post dilation, peptic ulcer disease, depression.  Patient is a retired [employer] scientist.  Echocardiogram and February 2018 revealed RV dilation. ? ?Patient presents for 6  month follow up. At last office visit ordered echo and stress test given atypical chest pain. Also at last visit added PRN hydralazine. Echo was stable compared to previous with LVEF 60-65% and stress test was overall low risk.  Patient is feeling well without complaints today.  Denies chest pain, dyspnea, dizziness.  She is only taking hydralazine a few times as blood pressure is typically well controlled.  She walks 1-1/2 miles 4-5 times per week without issue. ? ?Past Medical History:  ?Diagnosis Date  ? Allergy   ? Anemia   ? "chronic"  ? Angina   ? Prinzmetal angina  ? Anxiety and depression   ? Arthritis   ? Asthma   ? Atrial fibrillation ([place])   ? h/o "AF w/frequent PVCs"  ? Breast cancer ([place])   ? Left  ? Cancer [facility])   ? hx of skin cancer   ? CHF (congestive heart failure) ([place])   ? Chronic back pain greater than 3 months duration   ? on chronic narcotics, treated at pain clinic  ? Chronic respiratory failure ([place]) 09/15/2015  ? ONO 09/04/15 + desats >begin O2 at 2l/ m   ? Colon polyps   ? hyperplastic  ? Coronary artery disease   ? Arrythmia, orthostatic hypotension, HLD, HTN; sees Dr. Einar Gip   ? Difficult intubation   ? "TMJ & woke up when they were still cutting on me"  ? Dysrhythmia   ? sees Dr. Einar Gip and a cardiologist at [facility] health  ? Esophageal stricture   ? Family history of melanoma ]  ? Family history of pancreatic cancer   ? Fatty liver   ? Fatty liver   ? Fibroids   ? Fibromyalgia   ? "in my legs"  ? GERD (gastroesophageal reflux disease)   ? hx hiatal hernia, stricture and gastric ulcer  ? Headache(784.0)   ? migraines  ? Heart murmur   ? Hiatal hernia   ? History of loop recorder   ? History of migraines   ? "dx'd when I was in my teens"  ? Hyperlipemia   ? Hypertension   ? Mental disorder   ? Mild episode of recurrent major depressive disorder ([place]) 12/06/2015  ? Myocardial infarction [facility]) 1980's & 1990;  ? sees Dr. Einar Gip  ? OSA (obstructive sleep apnea)   ? Personal history of chemotherapy   ? Personal history of radiation therapy   ? Pneumonia   ? multiple times  ? PONV (postoperative nausea and vomiting)   ? Recurrent upper respiratory infection (URI)   ? S/P left TKA 09/25/2016  ? Shortness of breath 11/20/11  ? "all the time", sees pulmonlogy, ? asthma  ?  Stenotic cervical os   ? Stomach ulcer   ? "3 small; found in 05/2011"  ? TMJ (dislocation of temporomandibular joint)   ? Tuberculosis   ? + TB SKIN TEST  ? Type 2 diabetes mellitus without complication, without long-term current use of insulin ([place]) 12/06/2015  ? not on meds   ? ?Past Surgical History:  ?Procedure Laterality Date  ? ACHILLES TENDON REPAIR  1970's  ? left ankle  ? ARTHROSCOPIC REPAIR ACL    ? left knee cap  ? BREAST BIOPSY Right 04/08/2013  ? again in October or November 2020  ? BREAST LUMPECTOMY Left 12/09/2019  ? BREAST LUMPECTOMY WITH RADIOACTIVE SEED AND SENTINEL LYMPH NODE BIOPSY Left 12/09/2019  ? Procedure: LEFT BREAST LUMPECTOMY WITH RADIOACTIVE SEED AND LEFT AXILLARY SENTINEL LYMPH NODE BIOPSY;  Surgeon: Rolm Bookbinder, MD;  Location: [place];  Service: General;  Laterality: Left;  PEC BLOCK  ? CARDIAC  CATHETERIZATION    ? loop recorder  ? CARPAL TUNNEL RELEASE  unknown  ? left hand  ? COLONOSCOPY    ? ESOPHAGOGASTRODUODENOSCOPY (EGD) WITH PROPOFOL N/A 10/29/2017  ? Procedure: ESOPHAGOGASTRODUODENOSCOPY (EGD) WITH PROPOFOL;  Surgeon: Irene Shipper, MD;  [facility];  Service: Endoscopy;  Laterality: N/A;  ? LOOP RECORDER IMPLANT    ? PORT-A-CATH REMOVAL N/A 12/09/2019  ? Procedure: REMOVAL PORT-A-CATH;  Surgeon: Rolm Bookbinder, MD;  Location: [place];  Service: General;  Laterality: N/A;  ? PORTACATH PLACEMENT N/A 09/23/2019  ? Procedure: INSERTION PORT-A-CATH Right Internal Doreen Salvage;  Surgeon: Rolm Bookbinder, MD;  Location: [place];  Service: General;  Laterality: N/A;  ? post ganglionectomy  1970's  ? "for migraine headaches"  ? pouch string  [phone]  ? "did this 3 times (once w/each pregnancy)"  ? SAVORY DILATION N/A 10/29/2017  ? Procedure: SAVORY DILATION;  Surgeon: Irene Shipper, MD;  [facility];  Service: Endoscopy;  Laterality: N/A;  ? TOTAL KNEE ARTHROPLASTY Left 09/25/2016  ? Procedure: LEFT TOTAL KNEE ARTHROPLASTY;  Surgeon: Paralee Cancel, MD;  [facility];  Service: Orthopedics;  Laterality: Left;  ? TUBAL LIGATION  1980's  ? ?Family History  ?Problem Relation Age of Onset  ? Malignant hyperthermia Father   ? Hypertension Father   ? Heart disease Father   ? Diabetes Father   ? Cancer Father   ?     skin  ? Hypertension Mother   ? Heart disease Mother   ? Multiple myeloma Mother   ? Cancer Sister   ?     CERVICAL  ? Hypertension Sister   ? Cancer Brother 22  ?     MELANOMA  ? Heart disease Maternal Grandmother   ? Other Maternal Grandmother 32  ?     complications of childbirth  ? Heart disease Maternal Grandfather   ? Cancer Paternal Grandmother   ?     ?   ? Heart disease Paternal Grandmother   ? Heart disease Paternal Grandfather   ? Cancer Brother   ?     LUNG  ? Diabetes Sister   ? Hypertension Sister   ? Heart disease Sister   ? Cancer Sister   ?  Cancer Brother   ? Pancreatic cancer Niece 52  ? Cancer Nephew 64  ?     unknown- currently in the [place]  ? Anesthesia problems Neg Hx   ? Hypotension Neg Hx   ? Pseudochol deficiency Neg Hx   ? Colon cancer Neg Hx   ?  Esophageal cancer Neg Hx   ? Rectal cancer Neg Hx   ? Stomach cancer Neg Hx   ? Breast cancer Neg Hx   ?  ?Social History  ? ?Tobacco Use  ? Smoking status: Never  ? Smokeless tobacco: Never  ?Substance Use Topics  ? Alcohol use: No  ?  Alcohol/week: 0.0 standard drinks  ? ?Marital Status: Married  ? ?ROS  ?Review of Systems  ?Constitutional: Negative for malaise/fatigue and weight gain.  ?Cardiovascular:  Negative for chest pain (no recurrence), claudication, leg swelling, near-syncope, orthopnea, palpitations, paroxysmal nocturnal dyspnea and syncope.  ?Respiratory:  Negative for shortness of breath.   ?Hematologic/Lymphatic: Does not bruise/bleed easily.  ?Gastrointestinal:  Negative for melena.  ?Neurological:  Negative for weakness. Dizziness: improved. ? ?Objective  ?Blood pressure 117/74, pulse 74, temperature 97.8 ?F (36.6 ?C), temperature source Temporal, resp. rate 17, height _0  (1.676 m), weight 217 lb 12.8 oz (98.8 kg), SpO2 95 %.  ? ?  03/19/2022  ? 11:00 AM 02/26/2022  ?  9:00 AM 02/05/2022  ? 11:16 AM  ?Vitals with BMI  ?Height _1   _2   ?Weight 217 lbs 13 oz  223 lbs 13 oz  ?BMI 35.17  36.14  ?Systolic 471 855 015  ?Diastolic 74 70 82  ?Pulse 74  70  ?  ? Physical Exam ?Vitals reviewed.  ?Constitutional:   ?   Appearance: She is obese.  ?Cardiovascular:  ?   Rate and Rhythm: Normal rate and regular rhythm.  ?   Pulses: Intact distal pulses.     ?     Carotid pulses are 2+ on the right side and 2+ on the left side. ?     Radial pulses are 2+ on the right side and 2+ on the left side.  ?     Dorsalis pedis pulses are 2+ on the right side and 2+ on the left side.  ?     Posterior tibial pulses are 1+ on the right side and 1+ on the left side.  ?   Heart sounds: S1 normal and S2  normal. No murmur heard. ?  No gallop.  ?   Comments: Femoral and popliteal pulses difficult to evaluate due to patient body habitus. ?Pulmonary:  ?   Effort: Pulmonary effort is normal. No respiratory distress.  ?

## 2022-03-20 ENCOUNTER — Ambulatory Visit: Payer: Medicare Other

## 2022-03-20 DIAGNOSIS — R252 Cramp and spasm: Secondary | ICD-10-CM

## 2022-03-20 DIAGNOSIS — M542 Cervicalgia: Secondary | ICD-10-CM

## 2022-03-20 NOTE — Therapy (Signed)
?OUTPATIENT PHYSICAL THERAPY TREATMENT NOTE ? ? ?Patient Name: Shelby Anderson ?MRN: [id number] ?DOB:1956-03-09, 66 y.o., female ?Today's Date: 03/20/2022 ? ?PCP: Caren Macadam, MD ?REFERRING PROVIDER: Caren Macadam, MD ? ?END OF SESSION:  ? PT End of Session - 03/20/22 1334   ? ? Visit Number 4   ? Number of Visits 13   ? Date for PT Re-Evaluation 04/20/22   ? Authorization Type [employer] MEDICARE   ? Progress Note Due on Visit 10   ? PT Start Time 1330   ? PT Stop Time 6073   ? PT Time Calculation (min) 45 min   ? Activity Tolerance Patient tolerated treatment well   ? Behavior During Therapy [facility] for tasks assessed/performed   ? ?  ?  ? ?  ? ? ? ? ?Past Medical History:  ?Diagnosis Date  ? Allergy   ? Anemia   ? "chronic"  ? Angina   ? Prinzmetal angina  ? Anxiety and depression   ? Arthritis   ? Asthma   ? Atrial fibrillation ([place])   ? h/o "AF w/frequent PVCs"  ? Breast cancer ([place])   ? Left  ? Cancer [facility])   ? hx of skin cancer   ? CHF (congestive heart failure) ([name])   ? Chronic back pain greater than 3 months duration   ? on chronic narcotics, treated at pain clinic  ? Chronic respiratory failure ([place]) 09/15/2015  ? ONO 09/04/15 + desats >begin O2 at 2l/ m   ? Colon polyps   ? hyperplastic  ? Coronary artery disease   ? Arrythmia, orthostatic hypotension, HLD, HTN; sees Dr. Einar Gip  ? Difficult intubation   ? "TMJ & woke up when they were still cutting on me"  ? Dysrhythmia   ? sees Dr. Einar Gip and a cardiologist at [facility] health  ? Esophageal stricture   ? Family history of melanoma ]  ? Family history of pancreatic cancer   ? Fatty liver   ? Fatty liver   ? Fibroids   ? Fibromyalgia   ? "in my legs"  ? GERD (gastroesophageal reflux disease)   ? hx hiatal hernia, stricture and gastric ulcer  ? Headache(784.0)   ? migraines  ? Heart murmur   ? Hiatal hernia   ? History of loop recorder   ? History of migraines   ? "dx'd when I was in my teens"  ? Hyperlipemia   ? Hypertension   ? Mental disorder   ? Mild  episode of recurrent major depressive disorder ([place]) 12/06/2015  ? Myocardial infarction [facility]) 1980's & 1990;  ? sees Dr. Einar Gip  ? OSA (obstructive sleep apnea)   ? Personal history of chemotherapy   ? Personal history of radiation therapy   ? Pneumonia   ? multiple times  ? PONV (postoperative nausea and vomiting)   ? Recurrent upper respiratory infection (URI)   ? S/P left TKA 09/25/2016  ? Shortness of breath 11/20/11  ? "all the time", sees pulmonlogy, ? asthma  ? Stenotic cervical os   ? Stomach ulcer   ? "3 small; found in 05/2011"  ? TMJ (dislocation of temporomandibular joint)   ? Tuberculosis   ? + TB SKIN TEST  ? Type 2 diabetes mellitus without complication, without long-term current use of insulin ([place]) 12/06/2015  ? not on meds   ? ?Past Surgical History:  ?Procedure Laterality Date  ? ACHILLES TENDON REPAIR  1970's  ? left ankle  ? ARTHROSCOPIC  REPAIR ACL    ? left knee cap  ? BREAST BIOPSY Right 04/08/2013  ? again in October or November 2020  ? BREAST LUMPECTOMY Left 12/09/2019  ? BREAST LUMPECTOMY WITH RADIOACTIVE SEED AND SENTINEL LYMPH NODE BIOPSY Left 12/09/2019  ? Procedure: LEFT BREAST LUMPECTOMY WITH RADIOACTIVE SEED AND LEFT AXILLARY SENTINEL LYMPH NODE BIOPSY;  Surgeon: Rolm Bookbinder, MD;  Location: [place];  Service: General;  Laterality: Left;  PEC BLOCK  ? CARDIAC CATHETERIZATION    ? loop recorder  ? CARPAL TUNNEL RELEASE  unknown  ? left hand  ? COLONOSCOPY    ? ESOPHAGOGASTRODUODENOSCOPY (EGD) WITH PROPOFOL N/A 10/29/2017  ? Procedure: ESOPHAGOGASTRODUODENOSCOPY (EGD) WITH PROPOFOL;  Surgeon: Irene Shipper, MD;  [facility];  Service: Endoscopy;  Laterality: N/A;  ? LOOP RECORDER IMPLANT    ? PORT-A-CATH REMOVAL N/A 12/09/2019  ? Procedure: REMOVAL PORT-A-CATH;  Surgeon: Rolm Bookbinder, MD;  Location: [place];  Service: General;  Laterality: N/A;  ? PORTACATH PLACEMENT N/A 09/23/2019  ? Procedure: INSERTION PORT-A-CATH Right Internal Doreen Salvage;  Surgeon:  Rolm Bookbinder, MD;  Location: [place];  Service: General;  Laterality: N/A;  ? post ganglionectomy  1970's  ? "for migraine headaches"  ? pouch string  [phone]  ? "did this 3 times (once w/each pregnancy)"  ? SAVORY DILATION N/A 10/29/2017  ? Procedure: SAVORY DILATION;  Surgeon: Irene Shipper, MD;  [facility];  Service: Endoscopy;  Laterality: N/A;  ? TOTAL KNEE ARTHROPLASTY Left 09/25/2016  ? Procedure: LEFT TOTAL KNEE ARTHROPLASTY;  Surgeon: Paralee Cancel, MD;  [facility];  Service: Orthopedics;  Laterality: Left;  ? TUBAL LIGATION  1980's  ? ?Patient Active Problem List  ? Diagnosis Date Noted  ? Spondylosis without myelopathy or radiculopathy, cervical region 04/21/2021  ? Neutropenia with fever ([place]) 10/01/2019  ? Genetic testing 09/17/2019  ? Family history of melanoma   ? Family history of pancreatic cancer   ? Malignant neoplasm of upper-outer quadrant of left breast in female, estrogen receptor negative ([place]) 09/07/2019  ? Encounter for loop recorder at end of battery life 04/24/2018  ? Esophageal stricture 07/01/2017  ? Hyperlipidemia associated with type 2 diabetes mellitus ([place]) 05/20/2017  ? Allergic rhinitis 03/11/2017  ? Dilated cardiomyopathy ([place]) 02/07/2017  ? 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 Morbid obesity due to excess calories ([place]) 09/26/2016  ? Increased endometrial stripe thickness 06/27/2016  ? Intramural leiomyoma of uterus 06/27/2016  ? Type 2 diabetes mellitus without complication, without long-term current use of insulin ([place]) 12/06/2015  ? Hypertension associated with diabetes ([place]) 07/19/2015  ? Arrhythmia 07/19/2015  ? Orthostatic hypotension 07/19/2015  ? GERD (gastroesophageal reflux disease) 07/19/2015  ? Chronic back pain 07/19/2015  ? Neuropathy 07/19/2015  ? Symptomatic PVCs 11/02/2014  ? Syncope 11/02/2014  ? Chest pain 12/23/2013  ? Disorder of cervix 03/10/2013  ? Vaginal atrophy 03/10/2013  ? OSA (obstructive sleep apnea)  07/31/2012  ? Asthma 04/29/2012  ? Coronary artery disease 11/20/2011  ? ? ?REFERRING DIAG: Cervicogenic headache ? ?THERAPY DIAG:  ?Cervicalgia ? ?Cramp and spasm ? ?SUBJECTIVE:                                                                                                                                                                                                        ?  ?  SUBJECTIVE STATEMENT: ?My neck pain and ROM are better than Friday and my migraines are better as well. Pt has not had a migaine in 6 days.  ?   ?PAIN:  ?Are you having pain? Yes: NPRS scale: 0/10 at rest, 5/10 with tightness at the end range of motion.  ?Pain location: neck and head ?Pain description: upper neck and HA, constant pain ?Aggravating factors: Noise, sunlight ?Relieving factors: [place] migraine, sitting down and resting ? ?PERTINENT HISTORY:  ?Arthritis, Hx of HAs, DM2, anxiety and depression, angina, Afib, CHF, chronic respiratory failure, Hx MI, cancer, posterior ganglionectomy 1970s, Hx of LBP ?  ?PRECAUTIONS: None ?  ?WEIGHT BEARING RESTRICTIONS No ?  ?OCCUPATION: Disability ?  ?PLOF: Independent ?  ?PATIENT GOALS To decrease HAs ?  ?OBJECTIVE:  ?  ?DIAGNOSTIC FINDINGS:  ?Cervical MRI 03/20/21 ?IMPRESSION: ?1. Reactive marrow edema about the left C1-2 articulation due to ?osteoarthritic changes. Finding could serve as a source of patient's ?symptoms. ?2. Left paracentral disc osteophyte complex at C5-6 with resultant ?mild canal and moderate left C6 foraminal stenosis. ?3. Moderate left-sided facet hypertrophy at C3-4 with resultant mild ?left C4 foraminal stenosis. ?4. 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 ?  ?PATIENT SURVEYS:  ?FOTO TBA ?  ?COGNITION: ?Overall cognitive status: Within functional limits for tasks assessed ?  ?SENSATION: ?WFL ?  ?POSTURE:  ?Forward head c CT step off and neck hump, rounded shoulders ?  ?PALPATION: ?TTP of the bilat upper trap, levator, cervical paraspinals and suboccipitals  L>R ?  ?CERVICAL ROM:  ?  ?Active ROM A/PROM (deg) ?03/01/2022 AROM ?03/16/22  ?Flexion 40 sharp L pain on return   ?Extension 18 sharp pain L   ?Right lateral flexion 20 pulling pain L   ?Left lateral flexion 18 sh

## 2022-03-21 NOTE — Therapy (Incomplete)
?OUTPATIENT PHYSICAL THERAPY TREATMENT NOTE ? ? ?Patient Name: Shelby Anderson ?MRN: [id number] ?DOB:[birth year], 66 y.o., female ?Today's Date: 03/21/2022 ? ?PCP: Caren Macadam, MD ?REFERRING PROVIDER: Alda Berthold, DO ? ?END OF SESSION:  ? ? ? ? ? ?Past Medical History:  ?Diagnosis Date  ? Allergy   ? Anemia   ? "chronic"  ? Angina   ? Prinzmetal angina  ? Anxiety and depression   ? Arthritis   ? Asthma   ? Atrial fibrillation ([place])   ? h/o "AF w/frequent PVCs"  ? Breast cancer ([place])   ? Left  ? Cancer [facility])   ? hx of skin cancer   ? CHF (congestive heart failure) ([place])   ? Chronic back pain greater than 3 months duration   ? on chronic narcotics, treated at pain clinic  ? Chronic respiratory failure ([place]) 09/15/2015  ? ONO 09/04/15 + desats >begin O2 at 2l/ m   ? Colon polyps   ? hyperplastic  ? Coronary artery disease   ? Arrythmia, orthostatic hypotension, HLD, HTN; sees Dr. Einar Gip  ? Difficult intubation   ? "TMJ & woke up when they were still cutting on me"  ? Dysrhythmia   ? sees Dr. Einar Gip and a cardiologist at [facility]  ? Esophageal stricture   ? Family history of melanoma ]  ? Family history of pancreatic cancer   ? Fatty liver   ? Fatty liver   ? Fibroids   ? Fibromyalgia   ? "in my legs"  ? GERD (gastroesophageal reflux disease)   ? hx hiatal hernia, stricture and gastric ulcer  ? Headache(784.0)   ? migraines  ? Heart murmur   ? Hiatal hernia   ? History of loop recorder   ? History of migraines   ? "dx'd when I was in my teens"  ? Hyperlipemia   ? Hypertension   ? Mental disorder   ? Mild episode of recurrent major depressive disorder ([place]) 12/06/2015  ? Myocardial infarction [facility]) 1980's & 1990;  ? sees Dr. Einar Gip  ? OSA (obstructive sleep apnea)   ? Personal history of chemotherapy   ? Personal history of radiation therapy   ? Pneumonia   ? multiple times  ? PONV (postoperative nausea and vomiting)   ? Recurrent upper respiratory infection (URI)   ? S/P left TKA 09/25/2016  ? Shortness of  breath 11/20/11  ? "all the time", sees pulmonlogy, ? asthma  ? Stenotic cervical os   ? Stomach ulcer   ? "3 small; found in 05/2011"  ? TMJ (dislocation of temporomandibular joint)   ? Tuberculosis   ? + TB SKIN TEST  ? Type 2 diabetes mellitus without complication, without long-term current use of insulin ([place]) 12/06/2015  ? not on meds   ? ?Past Surgical History:  ?Procedure Laterality Date  ? ACHILLES TENDON REPAIR  1970's  ? left ankle  ? ARTHROSCOPIC REPAIR ACL    ? left knee cap  ? BREAST BIOPSY Right 04/08/2013  ? again in October or November 2020  ? BREAST LUMPECTOMY Left 12/09/2019  ? BREAST LUMPECTOMY WITH RADIOACTIVE SEED AND SENTINEL LYMPH NODE BIOPSY Left 12/09/2019  ? Procedure: LEFT BREAST LUMPECTOMY WITH RADIOACTIVE SEED AND LEFT AXILLARY SENTINEL LYMPH NODE BIOPSY;  Surgeon: Rolm Bookbinder, MD;  Location: [place];  Service: General;  Laterality: Left;  PEC BLOCK  ? CARDIAC CATHETERIZATION    ? loop recorder  ? CARPAL TUNNEL RELEASE  unknown  ? left  hand  ? COLONOSCOPY    ? ESOPHAGOGASTRODUODENOSCOPY (EGD) WITH PROPOFOL N/A 10/29/2017  ? Procedure: ESOPHAGOGASTRODUODENOSCOPY (EGD) WITH PROPOFOL;  Surgeon: Irene Shipper, MD;  [facility];  Service: Endoscopy;  Laterality: N/A;  ? LOOP RECORDER IMPLANT    ? PORT-A-CATH REMOVAL N/A 12/09/2019  ? Procedure: REMOVAL PORT-A-CATH;  Surgeon: Rolm Bookbinder, MD;  Location: [place];  Service: General;  Laterality: N/A;  ? PORTACATH PLACEMENT N/A 09/23/2019  ? Procedure: INSERTION PORT-A-CATH Right Internal Doreen Salvage;  Surgeon: Rolm Bookbinder, MD;  Location: [place];  Service: General;  Laterality: N/A;  ? post ganglionectomy  1970's  ? "for migraine headaches"  ? pouch string  [phone]  ? "did this 3 times (once w/each pregnancy)"  ? SAVORY DILATION N/A 10/29/2017  ? Procedure: SAVORY DILATION;  Surgeon: Irene Shipper, MD;  [facility];  Service: Endoscopy;  Laterality: N/A;  ? TOTAL KNEE ARTHROPLASTY Left 09/25/2016  ?  Procedure: LEFT TOTAL KNEE ARTHROPLASTY;  Surgeon: Paralee Cancel, MD;  [facility];  Service: Orthopedics;  Laterality: Left;  ? TUBAL LIGATION  1980's  ? ?Patient Active Problem List  ? Diagnosis Date Noted  ? Spondylosis without myelopathy or radiculopathy, cervical region 04/21/2021  ? Neutropenia with fever ([place]) 10/01/2019  ? Genetic testing 09/17/2019  ? Family history of melanoma   ? Family history of pancreatic cancer   ? Malignant neoplasm of upper-outer quadrant of left breast in female, estrogen receptor negative ([place]) 09/07/2019  ? Encounter for loop recorder at end of battery life 04/24/2018  ? Esophageal stricture 07/01/2017  ? Hyperlipidemia associated with type 2 diabetes mellitus ([place]) 05/20/2017  ? Allergic rhinitis 03/11/2017  ? Dilated cardiomyopathy ([place]) 02/07/2017  ? 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 Morbid obesity due to excess calories ([place]) 09/26/2016  ? Increased endometrial stripe thickness 06/27/2016  ? Intramural leiomyoma of uterus 06/27/2016  ? Type 2 diabetes mellitus without complication, without long-term current use of insulin ([place]) 12/06/2015  ? Hypertension associated with diabetes ([place]) 07/19/2015  ? Arrhythmia 07/19/2015  ? Orthostatic hypotension 07/19/2015  ? GERD (gastroesophageal reflux disease) 07/19/2015  ? Chronic back pain 07/19/2015  ? Neuropathy 07/19/2015  ? Symptomatic PVCs 11/02/2014  ? Syncope 11/02/2014  ? Chest pain 12/23/2013  ? Disorder of cervix 03/10/2013  ? Vaginal atrophy 03/10/2013  ? OSA (obstructive sleep apnea) 07/31/2012  ? Asthma 04/29/2012  ? Coronary artery disease 11/20/2011  ? ? ?REFERRING DIAG: Cervicogenic headache ? ?THERAPY DIAG:  ?No diagnosis found. ? ?SUBJECTIVE:                                                                                                                                                                                                        ?  ?  SUBJECTIVE STATEMENT: ?My neck pain and ROM  are better than Friday and my migraines are better as well. Pt has not had a migaine in 6 days.  ?   ?PAIN:  ?Are you having pain? Yes: NPRS scale: 0/10 at rest, 5/10 with tightness at the end range of motion.  ?Pain location: neck and head ?Pain description: upper neck and HA, constant pain ?Aggravating factors: Noise, sunlight ?Relieving factors: Excedrian migraine, sitting down and resting ? ?PERTINENT HISTORY:  ?Arthritis, Hx of HAs, DM2, anxiety and depression, angina, Afib, CHF, chronic respiratory failure, Hx MI, cancer, posterior ganglionectomy 1970s, Hx of LBP ?  ?PRECAUTIONS: None ?  ?WEIGHT BEARING RESTRICTIONS No ?  ?OCCUPATION: Disability ?  ?PLOF: Independent ?  ?PATIENT GOALS To decrease HAs ?  ?OBJECTIVE:  ?  ?DIAGNOSTIC FINDINGS:  ?Cervical MRI 03/20/21 ?IMPRESSION: ?1. Reactive marrow edema about the left C1-2 articulation due to ?osteoarthritic changes. Finding could serve as a source of patient's ?symptoms. ?2. Left paracentral disc osteophyte complex at C5-6 with resultant ?mild canal and moderate left C6 foraminal stenosis. ?3. Moderate left-sided facet hypertrophy at C3-4 with resultant mild ?left C4 foraminal stenosis. ?4. 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 ?  ?PATIENT SURVEYS:  ?FOTO TBA ?  ?COGNITION: ?Overall cognitive status: Within functional limits for tasks assessed ?  ?SENSATION: ?WFL ?  ?POSTURE:  ?Forward head c CT step off and neck hump, rounded shoulders ?  ?PALPATION: ?TTP of the bilat upper trap, levator, cervical paraspinals and suboccipitals L>R ?  ?CERVICAL ROM:  ?  ?Active ROM A/PROM (deg) ?03/01/2022 AROM ?03/16/22  ?Flexion 40 sharp L pain on return   ?Extension 18 sharp pain L   ?Right lateral flexion 20 pulling pain L   ?Left lateral flexion 18 sharp pain L   ?Right rotation 40 no pain 47  ?Left rotation 30 sharp pain L 38  ? (Blank rows = not tested) ?  ?UE ROM: ?           WNLs bilat ?  ?UE MMT: ?4+ to 5/5 and equal L and R ?  ?TODAY'S TREATMENT:  ? [facility]  Adult PT Treatment:                                                DATE: 03/22/22 ?Therapeutic Exercise: ?*** ?Manual Therapy: ?*** ?Neuromuscular re-ed: ?*** ?Therapeutic Activity: ?*** ?Modalities: ?***

## 2022-03-22 ENCOUNTER — Ambulatory Visit: Payer: Medicare Other

## 2022-03-22 ENCOUNTER — Telehealth: Payer: Self-pay | Admitting: Pharmacist

## 2022-03-22 NOTE — Chronic Care Management (AMB) (Signed)
? ? ?  Chronic Care Management ?Pharmacy Assistant  ? ?Name: KATHERINNE MOFIELD  MRN: [id number] DOB: [birth year] ? ?Reason for Encounter: Follow up with patient for Onboarding ? ?Left message for patient, [name] will contact patient next week to start the onboarding process.  ? ?[name] CMA  ?Clinical Pharmacist Assistant ?[phone] ? ?

## 2022-03-27 ENCOUNTER — Ambulatory Visit: Payer: Medicare Other

## 2022-03-27 NOTE — Therapy (Incomplete)
?OUTPATIENT PHYSICAL THERAPY TREATMENT NOTE ? ? ?Patient Name: Shelby Anderson ?MRN: [id number] ?DOB:1956/09/24, 66 y.o., female ?Today's Date: 03/27/2022 ? ?PCP: Caren Macadam, MD ?REFERRING PROVIDER: Caren Macadam, MD ? ?END OF SESSION:  ? ? ? ? ? ?Past Medical History:  ?Diagnosis Date  ? Allergy   ? Anemia   ? "chronic"  ? Angina   ? Prinzmetal angina  ? Anxiety and depression   ? Arthritis   ? Asthma   ? Atrial fibrillation ([place])   ? h/o "AF w/frequent PVCs"  ? Breast cancer ([place])   ? Left  ? Cancer [facility])   ? hx of skin cancer   ? CHF (congestive heart failure) ([place])   ? Chronic back pain greater than 3 months duration   ? on chronic narcotics, treated at pain clinic  ? Chronic respiratory failure ([place]) 09/15/2015  ? ONO 09/04/15 + desats >begin O2 at 2l/ m   ? Colon polyps   ? hyperplastic  ? Coronary artery disease   ? Arrythmia, orthostatic hypotension, HLD, HTN; sees Dr. Einar Gip  ? Difficult intubation   ? "TMJ & woke up when they were still cutting on me"  ? Dysrhythmia   ? sees Dr. Einar Gip and a cardiologist at [facility]  ? Esophageal stricture   ? Family history of melanoma ]  ? Family history of pancreatic cancer   ? Fatty liver   ? Fatty liver   ? Fibroids   ? Fibromyalgia   ? "in my legs"  ? GERD (gastroesophageal reflux disease)   ? hx hiatal hernia, stricture and gastric ulcer  ? Headache(784.0)   ? migraines  ? Heart murmur   ? Hiatal hernia   ? History of loop recorder   ? History of migraines   ? "dx'd when I was in my teens"  ? Hyperlipemia   ? Hypertension   ? Mental disorder   ? Mild episode of recurrent major depressive disorder ([place]) 12/06/2015  ? Myocardial infarction [facility]) 1980's & 1990;  ? sees Dr. Einar Gip  ? OSA (obstructive sleep apnea)   ? Personal history of chemotherapy   ? Personal history of radiation therapy   ? Pneumonia   ? multiple times  ? PONV (postoperative nausea and vomiting)   ? Recurrent upper respiratory infection (URI)   ? S/P left TKA 09/25/2016  ? Shortness  of breath 11/20/11  ? "all the time", sees pulmonlogy, ? asthma  ? Stenotic cervical os   ? Stomach ulcer   ? "3 small; found in 05/2011"  ? TMJ (dislocation of temporomandibular joint)   ? Tuberculosis   ? + TB SKIN TEST  ? Type 2 diabetes mellitus without complication, without long-term current use of insulin ([place]) 12/06/2015  ? not on meds   ? ?Past Surgical History:  ?Procedure Laterality Date  ? ACHILLES TENDON REPAIR  1970's  ? left ankle  ? ARTHROSCOPIC REPAIR ACL    ? left knee cap  ? BREAST BIOPSY Right 04/08/2013  ? again in October or November 2020  ? BREAST LUMPECTOMY Left 12/09/2019  ? BREAST LUMPECTOMY WITH RADIOACTIVE SEED AND SENTINEL LYMPH NODE BIOPSY Left 12/09/2019  ? Procedure: LEFT BREAST LUMPECTOMY WITH RADIOACTIVE SEED AND LEFT AXILLARY SENTINEL LYMPH NODE BIOPSY;  Surgeon: Rolm Bookbinder, MD;  Location: [facility];  Service: General;  Laterality: Left;  PEC BLOCK  ? CARDIAC CATHETERIZATION    ? loop recorder  ? CARPAL TUNNEL RELEASE  unknown  ? left  hand  ? COLONOSCOPY    ? ESOPHAGOGASTRODUODENOSCOPY (EGD) WITH PROPOFOL N/A 10/29/2017  ? Procedure: ESOPHAGOGASTRODUODENOSCOPY (EGD) WITH PROPOFOL;  Surgeon: Irene Shipper, MD;  [facility];  Service: Endoscopy;  Laterality: N/A;  ? LOOP RECORDER IMPLANT    ? PORT-A-CATH REMOVAL N/A 12/09/2019  ? Procedure: REMOVAL PORT-A-CATH;  Surgeon: Rolm Bookbinder, MD;  Location: [place];  Service: General;  Laterality: N/A;  ? PORTACATH PLACEMENT N/A 09/23/2019  ? Procedure: INSERTION PORT-A-CATH Right Internal Doreen Salvage;  Surgeon: Rolm Bookbinder, MD;  Location: [place];  Service: General;  Laterality: N/A;  ? post ganglionectomy  1970's  ? "for migraine headaches"  ? pouch string  [phone]  ? "did this 3 times (once w/each pregnancy)"  ? SAVORY DILATION N/A 10/29/2017  ? Procedure: SAVORY DILATION;  Surgeon: Irene Shipper, MD;  [facility];  Service: Endoscopy;  Laterality: N/A;  ? TOTAL KNEE ARTHROPLASTY Left 09/25/2016   ? Procedure: LEFT TOTAL KNEE ARTHROPLASTY;  Surgeon: Paralee Cancel, MD;  [facility];  Service: Orthopedics;  Laterality: Left;  ? TUBAL LIGATION  1980's  ? ?Patient Active Problem List  ? Diagnosis Date Noted  ? Spondylosis without myelopathy or radiculopathy, cervical region 04/21/2021  ? Neutropenia with fever ([place]) 10/01/2019  ? Genetic testing 09/17/2019  ? Family history of melanoma   ? Family history of pancreatic cancer   ? Malignant neoplasm of upper-outer quadrant of left breast in female, estrogen receptor negative ([place]) 09/07/2019  ? Encounter for loop recorder at end of battery life 04/24/2018  ? Esophageal stricture 07/01/2017  ? Hyperlipidemia associated with type 2 diabetes mellitus ([place]) 05/20/2017  ? Allergic rhinitis 03/11/2017  ? Dilated cardiomyopathy ([place]) 02/07/2017  ? 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 Morbid obesity due to excess calories ([place]) 09/26/2016  ? Increased endometrial stripe thickness 06/27/2016  ? Intramural leiomyoma of uterus 06/27/2016  ? Type 2 diabetes mellitus without complication, without long-term current use of insulin ([place]) 12/06/2015  ? Hypertension associated with diabetes ([place]) 07/19/2015  ? Arrhythmia 07/19/2015  ? Orthostatic hypotension 07/19/2015  ? GERD (gastroesophageal reflux disease) 07/19/2015  ? Chronic back pain 07/19/2015  ? Neuropathy 07/19/2015  ? Symptomatic PVCs 11/02/2014  ? Syncope 11/02/2014  ? Chest pain 12/23/2013  ? Disorder of cervix 03/10/2013  ? Vaginal atrophy 03/10/2013  ? OSA (obstructive sleep apnea) 07/31/2012  ? Asthma 04/29/2012  ? Coronary artery disease 11/20/2011  ? ? ?REFERRING DIAG: Cervicogenic headache ? ?THERAPY DIAG:  ?No diagnosis found. ? ?SUBJECTIVE:                                                                                                                                                                                                        ?  ?  SUBJECTIVE STATEMENT: ?My neck pain and ROM  are better than Friday and my migraines are better as well. Pt has not had a migaine in 6 days.  ?   ?PAIN:  ?Are you having pain? Yes: NPRS scale: 0/10 at rest, 5/10 with tightness at the end range of motion.  ?Pain location: neck and head ?Pain description: upper neck and HA, constant pain ?Aggravating factors: Noise, sunlight ?Relieving factors: Excedrian migraine, sitting down and resting ? ?PERTINENT HISTORY:  ?Arthritis, Hx of HAs, DM2, anxiety and depression, angina, Afib, CHF, chronic respiratory failure, Hx MI, cancer, posterior ganglionectomy 1970s, Hx of LBP ?  ?PRECAUTIONS: None ?  ?WEIGHT BEARING RESTRICTIONS No ?  ?OCCUPATION: Disability ?  ?PLOF: Independent ?  ?PATIENT GOALS To decrease HAs ?  ?OBJECTIVE:  ?  ?DIAGNOSTIC FINDINGS:  ?Cervical MRI 03/20/21 ?IMPRESSION: ?1. Reactive marrow edema about the left C1-2 articulation due to ?osteoarthritic changes. Finding could serve as a source of patient's ?symptoms. ?2. Left paracentral disc osteophyte complex at C5-6 with resultant ?mild canal and moderate left C6 foraminal stenosis. ?3. Moderate left-sided facet hypertrophy at C3-4 with resultant mild ?left C4 foraminal stenosis. ?4. 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 ?  ?PATIENT SURVEYS:  ?FOTO TBA ?  ?COGNITION: ?Overall cognitive status: Within functional limits for tasks assessed ?  ?SENSATION: ?WFL ?  ?POSTURE:  ?Forward head c CT step off and neck hump, rounded shoulders ?  ?PALPATION: ?TTP of the bilat upper trap, levator, cervical paraspinals and suboccipitals L>R ?  ?CERVICAL ROM:  ?  ?Active ROM A/PROM (deg) ?03/01/2022 AROM ?03/16/22  ?Flexion 40 sharp L pain on return   ?Extension 18 sharp pain L   ?Right lateral flexion 20 pulling pain L   ?Left lateral flexion 18 sharp pain L   ?Right rotation 40 no pain 47  ?Left rotation 30 sharp pain L 38  ? (Blank rows = not tested) ?  ?UE ROM: ?           WNLs bilat ?  ?UE MMT: ?4+ to 5/5 and equal L and R ?  ?TODAY'S TREATMENT:  ? [facility]  Adult PT Treatment:                                                DATE: 03/27/22 ?Therapeutic Exercise: ?*** ?Manual Therapy: ?*** ?Neuromuscular re-ed: ?*** ?Therapeutic Activity: ?*** ?Modalities:

## 2022-03-28 ENCOUNTER — Telehealth: Payer: Self-pay | Admitting: Pharmacist

## 2022-03-28 NOTE — Chronic Care Management (AMB) (Signed)
? ? ?  Chronic Care Management ?Pharmacy Assistant  ? ?Name: Shelby Anderson  MRN: [id number] DOB: 1956/05/30 ? ?Reason for Encounter: Follow up patient assistance for [place]. ? ?Spoke with Skyla at [facility], patients application was received 03/19/2022 and denied. She will need to apply for [place] at the [place].  ?If [employer] with SS is denied we can then send the denial letter with the application back to [facility] to attempt approval again.  ? ?Unable to reach patient after several attempts.  ?  ?[name] CMA  ?Clinical Pharmacist Assistant ?[phone] ? ?

## 2022-03-28 NOTE — Therapy (Incomplete)
?OUTPATIENT PHYSICAL THERAPY TREATMENT NOTE ? ? ?Patient Name: Shelby Anderson ?MRN: [id number] ?DOB:Jan 01, 1956, 66 y.o., female ?Today's Date: 03/28/2022 ? ?PCP: Caren Macadam, MD ?REFERRING PROVIDER: Alda Berthold, DO ? ?END OF SESSION:  ? ? ? ? ? ?Past Medical History:  ?Diagnosis Date  ? Allergy   ? Anemia   ? "chronic"  ? Angina   ? Prinzmetal angina  ? Anxiety and depression   ? Arthritis   ? Asthma   ? Atrial fibrillation ([place])   ? h/o "AF w/frequent PVCs"  ? Breast cancer ([place])   ? Left  ? Cancer [facility])   ? hx of skin cancer   ? CHF (congestive heart failure) ([place])   ? Chronic back pain greater than 3 months duration   ? on chronic narcotics, treated at pain clinic  ? Chronic respiratory failure ([place]) 09/15/2015  ? ONO 09/04/15 + desats >begin O2 at 2l/ m   ? Colon polyps   ? hyperplastic  ? Coronary artery disease   ? Arrythmia, orthostatic hypotension, HLD, HTN; sees Dr. Einar Gip  ? Difficult intubation   ? "TMJ & woke up when they were still cutting on me"  ? Dysrhythmia   ? sees Dr. Einar Gip and a cardiologist at [facility]  ? Esophageal stricture   ? Family history of melanoma ]  ? Family history of pancreatic cancer   ? Fatty liver   ? Fatty liver   ? Fibroids   ? Fibromyalgia   ? "in my legs"  ? GERD (gastroesophageal reflux disease)   ? hx hiatal hernia, stricture and gastric ulcer  ? Headache(784.0)   ? migraines  ? Heart murmur   ? Hiatal hernia   ? History of loop recorder   ? History of migraines   ? "dx'd when I was in my teens"  ? Hyperlipemia   ? Hypertension   ? Mental disorder   ? Mild episode of recurrent major depressive disorder ([place]) 12/06/2015  ? Myocardial infarction [facility]) 1980's & 1990;  ? sees Dr. Einar Gip  ? OSA (obstructive sleep apnea)   ? Personal history of chemotherapy   ? Personal history of radiation therapy   ? Pneumonia   ? multiple times  ? PONV (postoperative nausea and vomiting)   ? Recurrent upper respiratory infection (URI)   ? S/P left TKA 09/25/2016  ? Shortness of  breath 11/20/11  ? "all the time", sees pulmonlogy, ? asthma  ? Stenotic cervical os   ? Stomach ulcer   ? "3 small; found in 05/2011"  ? TMJ (dislocation of temporomandibular joint)   ? Tuberculosis   ? + TB SKIN TEST  ? Type 2 diabetes mellitus without complication, without long-term current use of insulin ([place]) 12/06/2015  ? not on meds   ? ?Past Surgical History:  ?Procedure Laterality Date  ? ACHILLES TENDON REPAIR  1970's  ? left ankle  ? ARTHROSCOPIC REPAIR ACL    ? left knee cap  ? BREAST BIOPSY Right 04/08/2013  ? again in October or November 2020  ? BREAST LUMPECTOMY Left 12/09/2019  ? BREAST LUMPECTOMY WITH RADIOACTIVE SEED AND SENTINEL LYMPH NODE BIOPSY Left 12/09/2019  ? Procedure: LEFT BREAST LUMPECTOMY WITH RADIOACTIVE SEED AND LEFT AXILLARY SENTINEL LYMPH NODE BIOPSY;  Surgeon: Rolm Bookbinder, MD;  Location: [place];  Service: General;  Laterality: Left;  PEC BLOCK  ? CARDIAC CATHETERIZATION    ? loop recorder  ? CARPAL TUNNEL RELEASE  unknown  ? left  hand  ? COLONOSCOPY    ? ESOPHAGOGASTRODUODENOSCOPY (EGD) WITH PROPOFOL N/A 10/29/2017  ? Procedure: ESOPHAGOGASTRODUODENOSCOPY (EGD) WITH PROPOFOL;  Surgeon: Irene Shipper, MD;  [facility];  Service: Endoscopy;  Laterality: N/A;  ? LOOP RECORDER IMPLANT    ? PORT-A-CATH REMOVAL N/A 12/09/2019  ? Procedure: REMOVAL PORT-A-CATH;  Surgeon: Rolm Bookbinder, MD;  Location: [place];  Service: General;  Laterality: N/A;  ? PORTACATH PLACEMENT N/A 09/23/2019  ? Procedure: INSERTION PORT-A-CATH Right Internal Doreen Salvage;  Surgeon: Rolm Bookbinder, MD;  Location: [place];  Service: General;  Laterality: N/A;  ? post ganglionectomy  1970's  ? "for migraine headaches"  ? pouch string  [phone]  ? "did this 3 times (once w/each pregnancy)"  ? SAVORY DILATION N/A 10/29/2017  ? Procedure: SAVORY DILATION;  Surgeon: Irene Shipper, MD;  [facility];  Service: Endoscopy;  Laterality: N/A;  ? TOTAL KNEE ARTHROPLASTY Left 09/25/2016  ?  Procedure: LEFT TOTAL KNEE ARTHROPLASTY;  Surgeon: Paralee Cancel, MD;  [facility];  Service: Orthopedics;  Laterality: Left;  ? TUBAL LIGATION  1980's  ? ?Patient Active Problem List  ? Diagnosis Date Noted  ? Spondylosis without myelopathy or radiculopathy, cervical region 04/21/2021  ? Neutropenia with fever ([place]) 10/01/2019  ? Genetic testing 09/17/2019  ? Family history of melanoma   ? Family history of pancreatic cancer   ? Malignant neoplasm of upper-outer quadrant of left breast in female, estrogen receptor negative ([place]) 09/07/2019  ? Encounter for loop recorder at end of battery life 04/24/2018  ? Esophageal stricture 07/01/2017  ? Hyperlipidemia associated with type 2 diabetes mellitus ([place]) 05/20/2017  ? Allergic rhinitis 03/11/2017  ? Dilated cardiomyopathy ([place]) 02/07/2017  ? 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 Morbid obesity due to excess calories ([employer]) 09/26/2016  ? Increased endometrial stripe thickness 06/27/2016  ? Intramural leiomyoma of uterus 06/27/2016  ? Type 2 diabetes mellitus without complication, without long-term current use of insulin ([place]) 12/06/2015  ? Hypertension associated with diabetes ([place]) 07/19/2015  ? Arrhythmia 07/19/2015  ? Orthostatic hypotension 07/19/2015  ? GERD (gastroesophageal reflux disease) 07/19/2015  ? Chronic back pain 07/19/2015  ? Neuropathy 07/19/2015  ? Symptomatic PVCs 11/02/2014  ? Syncope 11/02/2014  ? Chest pain 12/23/2013  ? Disorder of cervix 03/10/2013  ? Vaginal atrophy 03/10/2013  ? OSA (obstructive sleep apnea) 07/31/2012  ? Asthma 04/29/2012  ? Coronary artery disease 11/20/2011  ? ? ?REFERRING DIAG: Cervicogenic headache ? ?THERAPY DIAG:  ?No diagnosis found. ? ?SUBJECTIVE:                                                                                                                                                                                                        ?  ?  SUBJECTIVE STATEMENT: ?My neck pain and ROM  are better than Friday and my migraines are better as well. Pt has not had a migaine in 6 days.  ?   ?PAIN:  ?Are you having pain? Yes: NPRS scale: 0/10 at rest, 5/10 with tightness at the end range of motion.  ?Pain location: neck and head ?Pain description: upper neck and HA, constant pain ?Aggravating factors: Noise, sunlight ?Relieving factors: Excedrian migraine, sitting down and resting ? ?PERTINENT HISTORY:  ?Arthritis, Hx of HAs, DM2, anxiety and depression, angina, Afib, CHF, chronic respiratory failure, Hx MI, cancer, posterior ganglionectomy 1970s, Hx of LBP ?  ?PRECAUTIONS: None ?  ?WEIGHT BEARING RESTRICTIONS No ?  ?OCCUPATION: Disability ?  ?PLOF: Independent ?  ?PATIENT GOALS To decrease HAs ?  ?OBJECTIVE:  ?  ?DIAGNOSTIC FINDINGS:  ?Cervical MRI 03/20/21 ?IMPRESSION: ?1. Reactive marrow edema about the left C1-2 articulation due to ?osteoarthritic changes. Finding could serve as a source of patient's ?symptoms. ?2. Left paracentral disc osteophyte complex at C5-6 with resultant ?mild canal and moderate left C6 foraminal stenosis. ?3. Moderate left-sided facet hypertrophy at C3-4 with resultant mild ?left C4 foraminal stenosis. ?4. 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 ?  ?PATIENT SURVEYS:  ?FOTO TBA ?  ?COGNITION: ?Overall cognitive status: Within functional limits for tasks assessed ?  ?SENSATION: ?WFL ?  ?POSTURE:  ?Forward head c CT step off and neck hump, rounded shoulders ?  ?PALPATION: ?TTP of the bilat upper trap, levator, cervical paraspinals and suboccipitals L>R ?  ?CERVICAL ROM:  ?  ?Active ROM A/PROM (deg) ?03/01/2022 AROM ?03/16/22  ?Flexion 40 sharp L pain on return   ?Extension 18 sharp pain L   ?Right lateral flexion 20 pulling pain L   ?Left lateral flexion 18 sharp pain L   ?Right rotation 40 no pain 47  ?Left rotation 30 sharp pain L 38  ? (Blank rows = not tested) ?  ?UE ROM: ?           WNLs bilat ?  ?UE MMT: ?4+ to 5/5 and equal L and R ?  ?TODAY'S TREATMENT:  ? [facility]  Adult PT Treatment:                                                DATE: 03/29/22 ?Therapeutic Exercise: ?*** ?Manual Therapy: ?*** ?Neuromuscular re-ed: ?*** ?Therapeutic Activity: ?*** ?Modalities: ?***

## 2022-03-29 ENCOUNTER — Ambulatory Visit: Payer: Medicare Other

## 2022-04-01 ENCOUNTER — Other Ambulatory Visit: Payer: Self-pay | Admitting: Family Medicine

## 2022-04-01 DIAGNOSIS — E785 Hyperlipidemia, unspecified: Secondary | ICD-10-CM

## 2022-04-01 DIAGNOSIS — E119 Type 2 diabetes mellitus without complications: Secondary | ICD-10-CM | POA: Diagnosis not present

## 2022-04-01 DIAGNOSIS — E1169 Type 2 diabetes mellitus with other specified complication: Secondary | ICD-10-CM | POA: Diagnosis not present

## 2022-04-02 ENCOUNTER — Other Ambulatory Visit (INDEPENDENT_AMBULATORY_CARE_PROVIDER_SITE_OTHER): Payer: Medicare Other

## 2022-04-02 ENCOUNTER — Other Ambulatory Visit: Payer: Self-pay | Admitting: Family Medicine

## 2022-04-02 DIAGNOSIS — E038 Other specified hypothyroidism: Secondary | ICD-10-CM | POA: Diagnosis not present

## 2022-04-02 DIAGNOSIS — G894 Chronic pain syndrome: Secondary | ICD-10-CM | POA: Diagnosis not present

## 2022-04-02 DIAGNOSIS — E119 Type 2 diabetes mellitus without complications: Secondary | ICD-10-CM

## 2022-04-02 DIAGNOSIS — M47816 Spondylosis without myelopathy or radiculopathy, lumbar region: Secondary | ICD-10-CM | POA: Diagnosis not present

## 2022-04-02 DIAGNOSIS — G893 Neoplasm related pain (acute) (chronic): Secondary | ICD-10-CM | POA: Diagnosis not present

## 2022-04-02 DIAGNOSIS — Z79891 Long term (current) use of opiate analgesic: Secondary | ICD-10-CM | POA: Diagnosis not present

## 2022-04-02 LAB — TSH: TSH: 1.63 u[IU]/mL (ref 0.35–5.50)

## 2022-04-03 ENCOUNTER — Ambulatory Visit: Payer: Medicare Other | Attending: Neurology

## 2022-04-03 DIAGNOSIS — R252 Cramp and spasm: Secondary | ICD-10-CM | POA: Diagnosis not present

## 2022-04-03 DIAGNOSIS — M542 Cervicalgia: Secondary | ICD-10-CM

## 2022-04-03 MED ORDER — ONETOUCH ULTRA 2 W/DEVICE KIT: PACK | 1 refills | Status: DC

## 2022-04-03 NOTE — Therapy (Signed)
?OUTPATIENT PHYSICAL THERAPY TREATMENT NOTE ? ? ?Patient Name: Shelby Anderson ?MRN: [id number] ?DOB:[birth year], 66 y.o., female ?Today's Date: 04/03/2022 ? ?PCP: Caren Macadam, MD ?REFERRING PROVIDER: Caren Macadam, MD ? ?END OF SESSION:  ? PT End of Session - 04/03/22 1201   ? ? Visit Number 5   ? Number of Visits 13   ? Date for PT Re-Evaluation 04/20/22   ? Authorization Type [employer] MEDICARE   ? Progress Note Due on Visit 10   ? PT Start Time 1146   ? PT Stop Time 1230   ? PT Time Calculation (min) 44 min   ? Activity Tolerance Patient tolerated treatment well   ? Behavior During Therapy [facility] for tasks assessed/performed   ? ?  ?  ? ?  ? ? ? ? ? ?Past Medical History:  ?Diagnosis Date  ? Allergy   ? Anemia   ? "chronic"  ? Angina   ? Prinzmetal angina  ? Anxiety and depression   ? Arthritis   ? Asthma   ? Atrial fibrillation ([place])   ? h/o "AF w/frequent PVCs"  ? Breast cancer ([place])   ? Left  ? Cancer [facility])   ? hx of skin cancer   ? CHF (congestive heart failure) ([place])   ? Chronic back pain greater than 3 months duration   ? on chronic narcotics, treated at pain clinic  ? Chronic respiratory failure ([place]) 09/15/2015  ? ONO 09/04/15 + desats >begin O2 at 2l/ m   ? Colon polyps   ? hyperplastic  ? Coronary artery disease   ? Arrythmia, orthostatic hypotension, HLD, HTN; sees Dr. Einar Gip  ? Difficult intubation   ? "TMJ & woke up when they were still cutting on me"  ? Dysrhythmia   ? sees Dr. Einar Gip and a cardiologist at [facility] health  ? Esophageal stricture   ? Family history of melanoma ]  ? Family history of pancreatic cancer   ? Fatty liver   ? Fatty liver   ? Fibroids   ? Fibromyalgia   ? "in my legs"  ? GERD (gastroesophageal reflux disease)   ? hx hiatal hernia, stricture and gastric ulcer  ? Headache(784.0)   ? migraines  ? Heart murmur   ? Hiatal hernia   ? History of loop recorder   ? History of migraines   ? "dx'd when I was in my teens"  ? Hyperlipemia   ? Hypertension   ? Mental disorder   ? Mild  episode of recurrent major depressive disorder ([place]) 12/06/2015  ? Myocardial infarction [facility]) 1980's & 1990;  ? sees Dr. Einar Gip  ? OSA (obstructive sleep apnea)   ? Personal history of chemotherapy   ? Personal history of radiation therapy   ? Pneumonia   ? multiple times  ? PONV (postoperative nausea and vomiting)   ? Recurrent upper respiratory infection (URI)   ? S/P left TKA 09/25/2016  ? Shortness of breath 11/20/11  ? "all the time", sees pulmonlogy, ? asthma  ? Stenotic cervical os   ? Stomach ulcer   ? "3 small; found in 05/2011"  ? TMJ (dislocation of temporomandibular joint)   ? Tuberculosis   ? + TB SKIN TEST  ? Type 2 diabetes mellitus without complication, without long-term current use of insulin ([place]) 12/06/2015  ? not on meds   ? ?Past Surgical History:  ?Procedure Laterality Date  ? ACHILLES TENDON REPAIR  1970's  ? left ankle  ?  ARTHROSCOPIC REPAIR ACL    ? left knee cap  ? BREAST BIOPSY Right 04/08/2013  ? again in October or November 2020  ? BREAST LUMPECTOMY Left 12/09/2019  ? BREAST LUMPECTOMY WITH RADIOACTIVE SEED AND SENTINEL LYMPH NODE BIOPSY Left 12/09/2019  ? Procedure: LEFT BREAST LUMPECTOMY WITH RADIOACTIVE SEED AND LEFT AXILLARY SENTINEL LYMPH NODE BIOPSY;  Surgeon: Rolm Bookbinder, MD;  Location: [place];  Service: General;  Laterality: Left;  PEC BLOCK  ? CARDIAC CATHETERIZATION    ? loop recorder  ? CARPAL TUNNEL RELEASE  unknown  ? left hand  ? COLONOSCOPY    ? ESOPHAGOGASTRODUODENOSCOPY (EGD) WITH PROPOFOL N/A 10/29/2017  ? Procedure: ESOPHAGOGASTRODUODENOSCOPY (EGD) WITH PROPOFOL;  Surgeon: Irene Shipper, MD;  [facility];  Service: Endoscopy;  Laterality: N/A;  ? LOOP RECORDER IMPLANT    ? PORT-A-CATH REMOVAL N/A 12/09/2019  ? Procedure: REMOVAL PORT-A-CATH;  Surgeon: Rolm Bookbinder, MD;  Location: [place];  Service: General;  Laterality: N/A;  ? PORTACATH PLACEMENT N/A 09/23/2019  ? Procedure: INSERTION PORT-A-CATH Right Internal Doreen Salvage;  Surgeon:  Rolm Bookbinder, MD;  Location: [place];  Service: General;  Laterality: N/A;  ? post ganglionectomy  1970's  ? "for migraine headaches"  ? pouch string  [phone]  ? "did this 3 times (once w/each pregnancy)"  ? SAVORY DILATION N/A 10/29/2017  ? Procedure: SAVORY DILATION;  Surgeon: Irene Shipper, MD;  [facility];  Service: Endoscopy;  Laterality: N/A;  ? TOTAL KNEE ARTHROPLASTY Left 09/25/2016  ? Procedure: LEFT TOTAL KNEE ARTHROPLASTY;  Surgeon: Paralee Cancel, MD;  [facility];  Service: Orthopedics;  Laterality: Left;  ? TUBAL LIGATION  1980's  ? ?Patient Active Problem List  ? Diagnosis Date Noted  ? Spondylosis without myelopathy or radiculopathy, cervical region 04/21/2021  ? Neutropenia with fever ([place]) 10/01/2019  ? Genetic testing 09/17/2019  ? Family history of melanoma   ? Family history of pancreatic cancer   ? Malignant neoplasm of upper-outer quadrant of left breast in female, estrogen receptor negative ([place]) 09/07/2019  ? Encounter for loop recorder at end of battery life 04/24/2018  ? Esophageal stricture 07/01/2017  ? Hyperlipidemia associated with type 2 diabetes mellitus ([place]) 05/20/2017  ? Allergic rhinitis 03/11/2017  ? Dilated cardiomyopathy ([place]) 02/07/2017  ? 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 Morbid obesity due to excess calories ([place]) 09/26/2016  ? Increased endometrial stripe thickness 06/27/2016  ? Intramural leiomyoma of uterus 06/27/2016  ? Type 2 diabetes mellitus without complication, without long-term current use of insulin ([place]) 12/06/2015  ? Hypertension associated with diabetes ([place]) 07/19/2015  ? Arrhythmia 07/19/2015  ? Orthostatic hypotension 07/19/2015  ? GERD (gastroesophageal reflux disease) 07/19/2015  ? Chronic back pain 07/19/2015  ? Neuropathy 07/19/2015  ? Symptomatic PVCs 11/02/2014  ? Syncope 11/02/2014  ? Chest pain 12/23/2013  ? Disorder of cervix 03/10/2013  ? Vaginal atrophy 03/10/2013  ? OSA (obstructive sleep apnea)  07/31/2012  ? Asthma 04/29/2012  ? Coronary artery disease 11/20/2011  ? ? ?REFERRING DIAG: Cervicogenic headache ? ?THERAPY DIAG:  ?Cervicalgia ? ?Cramp and spasm ? ?SUBJECTIVE:                                                                                                                                                                                                        ?  ?  SUBJECTIVE STATEMENT: ?Pt reports overal she is doing better with improved neck ROM and less pain, approx 50%. Checking her blid spot when driving is better. Pt notes after the TPDN for the suboccipitals she experienced a migraine and increased l neck pain. ?   ?PAIN:  ?Are you having pain? Yes: NPRS scale: 0/10 at rest, 4/10 with tightness at the end range of motion.  ?Pain location: neck and head ?Pain description: upper neck and HA, constant pain ?Aggravating factors: Noise, sunlight ?Relieving factors: Excedrian migraine, sitting down and resting ? ?PERTINENT HISTORY:  ?Arthritis, Hx of HAs, DM2, anxiety and depression, angina, Afib, CHF, chronic respiratory failure, Hx MI, cancer, posterior ganglionectomy 1970s, Hx of LBP ?  ?PRECAUTIONS: None ?  ?WEIGHT BEARING RESTRICTIONS No ?  ?OCCUPATION: Disability ?  ?PLOF: Independent ?  ?PATIENT GOALS To decrease HAs ?  ?OBJECTIVE:  ?  ?DIAGNOSTIC FINDINGS:  ?Cervical MRI 03/20/21 ?IMPRESSION: ?1. Reactive marrow edema about the left C1-2 articulation due to ?osteoarthritic changes. Finding could serve as a source of patient's ?symptoms. ?2. Left paracentral disc osteophyte complex at C5-6 with resultant ?mild canal and moderate left C6 foraminal stenosis. ?3. Moderate left-sided facet hypertrophy at C3-4 with resultant mild ?left C4 foraminal stenosis. ?4. 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 ?  ?PATIENT SURVEYS:  ?FOTO TBA ?  ?COGNITION: ?Overall cognitive status: Within functional limits for tasks assessed ?  ?SENSATION: ?WFL ?  ?POSTURE:  ?Forward head c CT step off and neck  hump, rounded shoulders ?  ?PALPATION: ?TTP of the bilat upper trap, levator, cervical paraspinals and suboccipitals L>R ?  ?CERVICAL ROM:  ?  ?Active ROM A/PROM (deg) ?03/01/2022 AROM ?03/16/22 AROM ?04/03/22  ?Flex

## 2022-04-03 NOTE — Addendum Note (Signed)
Addended by: Agnes Lawrence on: 04/03/2022 10:56 AM ? ? Modules accepted: Orders ? ?

## 2022-04-04 ENCOUNTER — Telehealth: Payer: Self-pay | Admitting: Pharmacist

## 2022-04-04 NOTE — Telephone Encounter (Signed)
Called patient about [facility] PAP update and had to leave a voicemail. Requested a call back and will try to reach patient at another time. ?

## 2022-04-05 ENCOUNTER — Encounter: Payer: Self-pay | Admitting: Physical Therapy

## 2022-04-05 ENCOUNTER — Other Ambulatory Visit: Payer: Self-pay | Admitting: Family Medicine

## 2022-04-05 ENCOUNTER — Ambulatory Visit: Payer: Medicare Other | Admitting: Physical Therapy

## 2022-04-05 DIAGNOSIS — R252 Cramp and spasm: Secondary | ICD-10-CM

## 2022-04-05 DIAGNOSIS — M542 Cervicalgia: Secondary | ICD-10-CM | POA: Diagnosis not present

## 2022-04-05 DIAGNOSIS — E119 Type 2 diabetes mellitus without complications: Secondary | ICD-10-CM

## 2022-04-05 NOTE — Therapy (Signed)
?OUTPATIENT PHYSICAL THERAPY TREATMENT NOTE ? ? ?Patient Name: Shelby Anderson ?MRN: [id number] ?DOB:[birth year], 66 y.o., female ?Today's Date: 04/05/2022 ? ?PCP: Caren Macadam, MD ?REFERRING PROVIDER: Alda Berthold, DO ? ?END OF SESSION:  ? PT End of Session - 04/05/22 1150   ? ? Visit Number 6   ? Number of Visits 13   ? Date for PT Re-Evaluation 04/20/22   ? Authorization Type [employer]HC MEDICARE   ? Progress Note Due on Visit 10   ? PT Start Time 1148   ? PT Stop Time 1230   ? PT Time Calculation (min) 42 min   ? ?  ?  ? ?  ? ? ? ? ? ?Past Medical History:  ?Diagnosis Date  ? Allergy   ? Anemia   ? "chronic"  ? Angina   ? Prinzmetal angina  ? Anxiety and depression   ? Arthritis   ? Asthma   ? Atrial fibrillation ([place])   ? h/o "AF w/frequent PVCs"  ? Breast cancer ([place])   ? Left  ? Cancer [facility])   ? hx of skin cancer   ? CHF (congestive heart failure) ([place])   ? Chronic back pain greater than 3 months duration   ? on chronic narcotics, treated at pain clinic  ? Chronic respiratory failure ([place]) 09/15/2015  ? ONO 09/04/15 + desats >begin O2 at 2l/ m   ? Colon polyps   ? hyperplastic  ? Coronary artery disease   ? Arrythmia, orthostatic hypotension, HLD, HTN; sees Dr. Einar Gip  ? Difficult intubation   ? "TMJ & woke up when they were still cutting on me"  ? Dysrhythmia   ? sees Dr. Einar Gip and a cardiologist at [facility]  ? Esophageal stricture   ? Family history of melanoma ]  ? Family history of pancreatic cancer   ? Fatty liver   ? Fatty liver   ? Fibroids   ? Fibromyalgia   ? "in my legs"  ? GERD (gastroesophageal reflux disease)   ? hx hiatal hernia, stricture and gastric ulcer  ? Headache(784.0)   ? migraines  ? Heart murmur   ? Hiatal hernia   ? History of loop recorder   ? History of migraines   ? "dx'd when I was in my teens"  ? Hyperlipemia   ? Hypertension   ? Mental disorder   ? Mild episode of recurrent major depressive disorder ([place]) 12/06/2015  ? Myocardial infarction [facility]) 1980's & 1990;  ? sees Dr.  Einar Gip  ? OSA (obstructive sleep apnea)   ? Personal history of chemotherapy   ? Personal history of radiation therapy   ? Pneumonia   ? multiple times  ? PONV (postoperative nausea and vomiting)   ? Recurrent upper respiratory infection (URI)   ? S/P left TKA 09/25/2016  ? Shortness of breath 11/20/11  ? "all the time", sees pulmonlogy, ? asthma  ? Stenotic cervical os   ? Stomach ulcer   ? "3 small; found in 05/2011"  ? TMJ (dislocation of temporomandibular joint)   ? Tuberculosis   ? + TB SKIN TEST  ? Type 2 diabetes mellitus without complication, without long-term current use of insulin ([place]) 12/06/2015  ? not on meds   ? ?Past Surgical History:  ?Procedure Laterality Date  ? ACHILLES TENDON REPAIR  1970's  ? left ankle  ? ARTHROSCOPIC REPAIR ACL    ? left knee cap  ? BREAST BIOPSY Right 04/08/2013  ? again  in October or November 2020  ? BREAST LUMPECTOMY Left 12/09/2019  ? BREAST LUMPECTOMY WITH RADIOACTIVE SEED AND SENTINEL LYMPH NODE BIOPSY Left 12/09/2019  ? Procedure: LEFT BREAST LUMPECTOMY WITH RADIOACTIVE SEED AND LEFT AXILLARY SENTINEL LYMPH NODE BIOPSY;  Surgeon: Rolm Bookbinder, MD;  Location: [place];  Service: General;  Laterality: Left;  PEC BLOCK  ? CARDIAC CATHETERIZATION    ? loop recorder  ? CARPAL TUNNEL RELEASE  unknown  ? left hand  ? COLONOSCOPY    ? ESOPHAGOGASTRODUODENOSCOPY (EGD) WITH PROPOFOL N/A 10/29/2017  ? Procedure: ESOPHAGOGASTRODUODENOSCOPY (EGD) WITH PROPOFOL;  Surgeon: Irene Shipper, MD;  [facility];  Service: Endoscopy;  Laterality: N/A;  ? LOOP RECORDER IMPLANT    ? PORT-A-CATH REMOVAL N/A 12/09/2019  ? Procedure: REMOVAL PORT-A-CATH;  Surgeon: Rolm Bookbinder, MD;  Location: [place];  Service: General;  Laterality: N/A;  ? PORTACATH PLACEMENT N/A 09/23/2019  ? Procedure: INSERTION PORT-A-CATH Right Internal Doreen Salvage;  Surgeon: Rolm Bookbinder, MD;  Location: [place];  Service: General;  Laterality: N/A;  ? post ganglionectomy  1970's  ? "for migraine  headaches"  ? pouch string  [phone]  ? "did this 3 times (once w/each pregnancy)"  ? SAVORY DILATION N/A 10/29/2017  ? Procedure: SAVORY DILATION;  Surgeon: Irene Shipper, MD;  [facility];  Service: Endoscopy;  Laterality: N/A;  ? TOTAL KNEE ARTHROPLASTY Left 09/25/2016  ? Procedure: LEFT TOTAL KNEE ARTHROPLASTY;  Surgeon: Paralee Cancel, MD;  [facility];  Service: Orthopedics;  Laterality: Left;  ? TUBAL LIGATION  1980's  ? ?Patient Active Problem List  ? Diagnosis Date Noted  ? Spondylosis without myelopathy or radiculopathy, cervical region 04/21/2021  ? Neutropenia with fever ([place]) 10/01/2019  ? Genetic testing 09/17/2019  ? Family history of melanoma   ? Family history of pancreatic cancer   ? Malignant neoplasm of upper-outer quadrant of left breast in female, estrogen receptor negative ([place]) 09/07/2019  ? Encounter for loop recorder at end of battery life 04/24/2018  ? Esophageal stricture 07/01/2017  ? Hyperlipidemia associated with type 2 diabetes mellitus ([place]) 05/20/2017  ? Allergic rhinitis 03/11/2017  ? Dilated cardiomyopathy ([place]) 02/07/2017  ? 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 Morbid obesity due to excess calories ([place]) 09/26/2016  ? Increased endometrial stripe thickness 06/27/2016  ? Intramural leiomyoma of uterus 06/27/2016  ? Type 2 diabetes mellitus without complication, without long-term current use of insulin ([place]) 12/06/2015  ? Hypertension associated with diabetes ([place]) 07/19/2015  ? Arrhythmia 07/19/2015  ? Orthostatic hypotension 07/19/2015  ? GERD (gastroesophageal reflux disease) 07/19/2015  ? Chronic back pain 07/19/2015  ? Neuropathy 07/19/2015  ? Symptomatic PVCs 11/02/2014  ? Syncope 11/02/2014  ? Chest pain 12/23/2013  ? Disorder of cervix 03/10/2013  ? Vaginal atrophy 03/10/2013  ? OSA (obstructive sleep apnea) 07/31/2012  ? Asthma 04/29/2012  ? Coronary artery disease 11/20/2011  ? ? ?REFERRING DIAG: Cervicogenic headache ? ?THERAPY DIAG:   ?Cervicalgia ? ?Cramp and spasm ? ?SUBJECTIVE:                                                                                                                                                                                                        ?  ?  SUBJECTIVE STATEMENT: ?Pt reports overal she is doing better with improved neck ROM and less pain, approx 50%. Checking her blid spot when driving is better. Pt notes after the TPDN for the suboccipitals she experienced a migraine and increased l neck pain. ?   ?PAIN:  ?Are you having pain? Yes: NPRS scale: 0/10 at rest, 4/10 with tightness at the end range of motion.  ?Pain location: neck and head ?Pain description: upper neck and HA, constant pain ?Aggravating factors: Noise, sunlight ?Relieving factors: Excedrian migraine, sitting down and resting ? ?PERTINENT HISTORY:  ?Arthritis, Hx of HAs, DM2, anxiety and depression, angina, Afib, CHF, chronic respiratory failure, Hx MI, cancer, posterior ganglionectomy 1970s, Hx of LBP ?  ?PRECAUTIONS: None ?  ?WEIGHT BEARING RESTRICTIONS No ?  ?OCCUPATION: Disability ?  ?PLOF: Independent ?  ?PATIENT GOALS To decrease HAs ?  ?OBJECTIVE:  ?  ?DIAGNOSTIC FINDINGS:  ?Cervical MRI 03/20/21 ?IMPRESSION: ?1. Reactive marrow edema about the left C1-2 articulation due to ?osteoarthritic changes. Finding could serve as a source of patient's ?symptoms. ?2. Left paracentral disc osteophyte complex at C5-6 with resultant ?mild canal and moderate left C6 foraminal stenosis. ?3. Moderate left-sided facet hypertrophy at C3-4 with resultant mild ?left C4 foraminal stenosis. ?4. 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 ?  ?PATIENT SURVEYS:  ?FOTO TBA ?  ?COGNITION: ?Overall cognitive status: Within functional limits for tasks assessed ?  ?SENSATION: ?WFL ?  ?POSTURE:  ?Forward head c CT step off and neck hump, rounded shoulders ?  ?PALPATION: ?TTP of the bilat upper trap, levator, cervical paraspinals and suboccipitals L>R ?   ?CERVICAL ROM:  ?  ?Active ROM A/PROM (deg) ?03/01/2022 AROM ?03/16/22 AROM ?04/03/22  ?Flexion 40 sharp L pain on return    ?Extension 18 sharp pain L    ?Right lateral flexion 20 pulling pain L  24  ?Left latera

## 2022-04-06 MED ORDER — ONETOUCH ULTRA 2 W/DEVICE KIT: PACK | 1 refills | Status: DC

## 2022-04-06 NOTE — Addendum Note (Signed)
Addended by: Lahoma Crocker A on: 04/06/2022 11:01 AM ? ? Modules accepted: Orders ? ?

## 2022-04-10 ENCOUNTER — Other Ambulatory Visit: Payer: Self-pay

## 2022-04-10 ENCOUNTER — Other Ambulatory Visit: Payer: Self-pay | Admitting: *Deleted

## 2022-04-10 ENCOUNTER — Ambulatory Visit: Payer: Medicare Other

## 2022-04-10 DIAGNOSIS — M542 Cervicalgia: Secondary | ICD-10-CM

## 2022-04-10 DIAGNOSIS — J45991 Cough variant asthma: Secondary | ICD-10-CM

## 2022-04-10 DIAGNOSIS — R252 Cramp and spasm: Secondary | ICD-10-CM | POA: Diagnosis not present

## 2022-04-10 MED ORDER — BUDESONIDE-FORMOTEROL FUMARATE 80-4.5 MCG/ACT IN AERO: INHALATION_SPRAY | RESPIRATORY_TRACT | 3 refills | Status: DC

## 2022-04-10 NOTE — Therapy (Signed)
**Note Shelby-Identified via Obfuscation** ?OUTPATIENT PHYSICAL THERAPY TREATMENT NOTE ? ? ?Patient Name: TARREN Anderson ?MRN: [id number] ?DOB:1956/02/28, 66 y.o., female ?Today's Date: 04/10/2022 ? ?PCP: Caren Macadam, MD ?REFERRING PROVIDER: Alda Berthold, DO ? ?END OF SESSION:  ? PT End of Session - 04/10/22 1214   ? ? Visit Number 7   ? Number of Visits 13   ? Date for PT Re-Evaluation 04/20/22   ? Authorization Type [employer]HC MEDICARE   ? Progress Note Due on Visit 10   ? PT Start Time 1058   ? PT Stop Time 1145   ? PT Time Calculation (min) 47 min   ? Activity Tolerance Patient tolerated treatment well   ? Behavior During Therapy [facility] for tasks assessed/performed   ? ?  ?  ? ?  ? ? ? ? ? ? ?Past Medical History:  ?Diagnosis Date  ? Allergy   ? Anemia   ? "chronic"  ? Angina   ? Prinzmetal angina  ? Anxiety and depression   ? Arthritis   ? Asthma   ? Atrial fibrillation ([place])   ? h/o "AF w/frequent PVCs"  ? Breast cancer ([place] Anderson)   ? Left  ? [facility])   ? hx of skin cancer   ? CHF (congestive heart failure) ([place] Anderson)   ? Chronic back pain greater than 3 months duration   ? on chronic narcotics, treated at pain clinic  ? Chronic respiratory failure ([place]) 09/15/2015  ? ONO 09/04/15 + desats >begin O2 at 2l/ m   ? Colon polyps   ? hyperplastic  ? Coronary artery disease   ? Arrythmia, orthostatic hypotension, HLD, HTN; sees Dr. Einar Gip  ? Difficult intubation   ? "TMJ & woke up when they were still cutting on me"  ? Dysrhythmia   ? sees Dr. Einar Gip and a cardiologist at [facility]  ? Esophageal stricture   ? Family history of melanoma ]  ? Family history of pancreatic cancer   ? Fatty liver   ? Fatty liver   ? Fibroids   ? Fibromyalgia   ? "in my legs"  ? GERD (gastroesophageal reflux disease)   ? hx hiatal hernia, stricture and gastric ulcer  ? Headache(784.0)   ? migraines  ? Heart murmur   ? Hiatal hernia   ? History of loop recorder   ? History of migraines   ? "dx'd when I was in my teens"  ? Hyperlipemia   ? Hypertension   ? Mental disorder   ? Mild  episode of recurrent major depressive disorder ([place] Anderson) 12/06/2015  ? Myocardial infarction [facility]) 1980's & 1990;  ? sees Dr. Einar Gip  ? OSA (obstructive sleep apnea)   ? Personal history of chemotherapy   ? Personal history of radiation therapy   ? Pneumonia   ? multiple times  ? PONV (postoperative nausea and vomiting)   ? Recurrent upper respiratory infection (URI)   ? S/P left TKA 09/25/2016  ? Shortness of breath 11/20/11  ? "all the time", sees pulmonlogy, ? asthma  ? Stenotic cervical os   ? Stomach ulcer   ? "3 small; found in 05/2011"  ? TMJ (dislocation of temporomandibular joint)   ? Tuberculosis   ? + TB SKIN TEST  ? Type 2 diabetes mellitus without complication, without long-term current use of insulin ([place] Anderson) 12/06/2015  ? not on meds   ? ?Past Surgical History:  ?Procedure Laterality Date  ? ACHILLES TENDON REPAIR  1970's  ? left ankle  ?  ARTHROSCOPIC REPAIR ACL    ? left knee cap  ? BREAST BIOPSY Right 04/08/2013  ? again in October or November 2020  ? BREAST LUMPECTOMY Left 12/09/2019  ? BREAST LUMPECTOMY WITH RADIOACTIVE SEED AND SENTINEL LYMPH NODE BIOPSY Left 12/09/2019  ? Procedure: LEFT BREAST LUMPECTOMY WITH RADIOACTIVE SEED AND LEFT AXILLARY SENTINEL LYMPH NODE BIOPSY;  Surgeon: Rolm Bookbinder, MD;  Location: [place];  Service: General;  Laterality: Left;  PEC BLOCK  ? CARDIAC CATHETERIZATION    ? loop recorder  ? CARPAL TUNNEL RELEASE  unknown  ? left hand  ? COLONOSCOPY    ? ESOPHAGOGASTRODUODENOSCOPY (EGD) WITH PROPOFOL N/A 10/29/2017  ? Procedure: ESOPHAGOGASTRODUODENOSCOPY (EGD) WITH PROPOFOL;  Surgeon: Irene Shipper, MD;  [facility];  Service: Endoscopy;  Laterality: N/A;  ? LOOP RECORDER IMPLANT    ? PORT-A-CATH REMOVAL N/A 12/09/2019  ? Procedure: REMOVAL PORT-A-CATH;  Surgeon: Rolm Bookbinder, MD;  Location: [place];  Service: General;  Laterality: N/A;  ? PORTACATH PLACEMENT N/A 09/23/2019  ? Procedure: INSERTION PORT-A-CATH Right Internal Doreen Salvage;  Surgeon:  Rolm Bookbinder, MD;  Location: [place];  Service: General;  Laterality: N/A;  ? post ganglionectomy  1970's  ? "for migraine headaches"  ? pouch string  [phone]  ? "did this 3 times (once w/each pregnancy)"  ? SAVORY DILATION N/A 10/29/2017  ? Procedure: SAVORY DILATION;  Surgeon: Irene Shipper, MD;  Location[facility];  Service: Endoscopy;  Laterality: N/A;  ? TOTAL KNEE ARTHROPLASTY Left 09/25/2016  ? Procedure: LEFT TOTAL KNEE ARTHROPLASTY;  Surgeon: Paralee Cancel, MD;  [facility];  Service: Orthopedics;  Laterality: Left;  ? TUBAL LIGATION  1980's  ? ?Patient Active Problem List  ? Diagnosis Date Noted  ? Spondylosis without myelopathy or radiculopathy, cervical region 04/21/2021  ? Neutropenia with fever ([place]) 10/01/2019  ? Genetic testing 09/17/2019  ? Family history of melanoma   ? Family history of pancreatic cancer   ? Malignant neoplasm of upper-outer quadrant of left breast in female, estrogen receptor negative ([place]) 09/07/2019  ? Encounter for loop recorder at end of battery life 04/24/2018  ? Esophageal stricture 07/01/2017  ? Hyperlipidemia associated with type 2 diabetes mellitus ([place]) 05/20/2017  ? Allergic rhinitis 03/11/2017  ? Dilated cardiomyopathy ([place]hel[place] Anderson) 02/07/2017  ? 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 Morbid obesity due to excess calories ([place]hel[place] Anderson) 09/26/2016  ? Increased endometrial stripe thickness 06/27/2016  ? Intramural leiomyoma of uterus 06/27/2016  ? Type 2 diabetes mellitus without complication, without long-term current use of insulin ([place] Anderson) 12/06/2015  ? Hypertension associated with diabetes ([place]) 07/19/2015  ? Arrhythmia 07/19/2015  ? Orthostatic hypotension 07/19/2015  ? GERD (gastroesophageal reflux disease) 07/19/2015  ? Chronic back pain 07/19/2015  ? Neuropathy 07/19/2015  ? Symptomatic PVCs 11/02/2014  ? Syncope 11/02/2014  ? Chest pain 12/23/2013  ? Disorder of cervix 03/10/2013  ? Vaginal atrophy 03/10/2013  ? OSA (obstructive sleep apnea)  07/31/2012  ? Asthma 04/29/2012  ? Coronary artery disease 11/20/2011  ? ? ?REFERRING DIAG: Cervicogenic headache ? ?THERAPY DIAG:  ?Cervicalgia ? ?Cramp and spasm ? ?SUBJECTIVE:                                                                                                                                                                                                        ?  ?  SUBJECTIVE STATEMENT: I wake up with a HA and buzzing in my ears every morning. Pt notes she was having migraine HAs 2-3x per week , now ony 1 per week. ? ?PAIN:  ?Are you having pain? Yes: NPRS scale: 0/10 at rest, 4/10 with tightness at the end range of motion.  ?Pain location: neck and head ?Pain description: upper neck and HA, constant pain ?Aggravating factors: Noise, sunlight ?Relieving factors: Excedrian migraine, sitting down and resting ? ?PERTINENT HISTORY:  ?Arthritis, Hx of HAs, DM2, anxiety and depression, angina, Afib, CHF, chronic respiratory failure, Hx MI, cancer, posterior ganglionectomy 1970s, Hx of LBP ?  ?PRECAUTIONS: None ?  ?WEIGHT BEARING RESTRICTIONS No ?  ?OCCUPATION: Disability ?  ?PLOF: Independent ?  ?PATIENT GOALS To decrease HAs ?  ?OBJECTIVE:  ?  ?DIAGNOSTIC FINDINGS:  ?Cervical MRI 03/20/21 ?IMPRESSION: ?1. Reactive marrow edema about the left C1-2 articulation due to ?osteoarthritic changes. Finding could serve as a source of patient's ?symptoms. ?2. Left paracentral disc osteophyte complex at C5-6 with resultant ?mild canal and moderate left C6 foraminal stenosis. ?3. Moderate left-sided facet hypertrophy at C3-4 with resultant mild ?left C4 foraminal stenosis. ?4. 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 ?  ?PATIENT SURVEYS:  ?FOTO TBA ?  ?COGNITION: ?Overall cognitive status: Within functional limits for tasks assessed ?  ?SENSATION: ?WFL ?  ?POSTURE:  ?Forward head c CT step off and neck hump, rounded shoulders ?  ?PALPATION: ?TTP of the bilat upper trap, levator, cervical paraspinals and  suboccipitals L>R ?  ?CERVICAL ROM:  ?  ?Active ROM A/PROM (deg) ?03/01/2022 AROM ?03/16/22 AROM ?04/03/22  ?Flexion 40 sharp L pain on return    ?Extension 18 sharp pain L    ?Right lateral flexion 20 pulling pain L  24

## 2022-04-11 NOTE — Therapy (Addendum)
OUTPATIENT PHYSICAL THERAPY TREATMENT NOTE/Discharge   Patient Name: Shelby Anderson MRN: [id number] DOB:[birth year], 66 y.o., female Today's Date: 04/12/2022  PCP: Caren Macadam, MD REFERRING PROVIDER: Alda Berthold, DO  END OF SESSION:   PT End of Session - 04/12/22 1331     Visit Number 8    Number of Visits 13    Date for PT Re-Evaluation 04/20/22    Authorization Type [employer] MEDICARE    Progress Note Due on Visit 10    PT Start Time 1330    PT Stop Time 1413    PT Time Calculation (min) 43 min    Activity Tolerance Patient tolerated treatment well    Behavior During Therapy WFL for tasks assessed/performed                  Past Medical History:  Diagnosis Date   Allergy    Anemia    "chronic"   Angina    Prinzmetal angina   Anxiety and depression    Arthritis    Asthma    Atrial fibrillation ([place])    h/o "AF w/frequent PVCs"   Breast cancer ([place])    Left   Cancer ([place])    hx of skin cancer    CHF (congestive heart failure) (HCC)    Chronic back pain greater than 3 months duration    on chronic narcotics, treated at pain clinic   Chronic respiratory failure ([place]) 09/15/2015   ONO 09/04/15 + desats >begin O2 at 2l/ m    Colon polyps    hyperplastic   Coronary artery disease    Arrythmia, orthostatic hypotension, HLD, HTN; sees Dr. Einar Gip   Difficult intubation    "TMJ & woke up when they were still cutting on me"   Dysrhythmia    sees Dr. Einar Gip and a cardiologist at [facility]   Esophageal stricture    Family history of melanoma ]   Family history of pancreatic cancer    Fatty liver    Fatty liver    Fibroids    Fibromyalgia    "in my legs"   GERD (gastroesophageal reflux disease)    hx hiatal hernia, stricture and gastric ulcer   Headache(784.0)    migraines   Heart murmur    Hiatal hernia    History of loop recorder    History of migraines    "dx'd when I was in my teens"   Hyperlipemia    Hypertension    Mental disorder     Mild episode of recurrent major depressive disorder ([place]) 12/06/2015   Myocardial infarction [facility]) 1980's & 1990;   sees Dr. Einar Gip   OSA (obstructive sleep apnea)    Personal history of chemotherapy    Personal history of radiation therapy    Pneumonia    multiple times   PONV (postoperative nausea and vomiting)    Recurrent upper respiratory infection (URI)    S/P left TKA 09/25/2016   Shortness of breath 11/20/11   "all the time", sees pulmonlogy, ? asthma   Stenotic cervical os    Stomach ulcer    "3 small; found in 05/2011"   TMJ (dislocation of temporomandibular joint)    Tuberculosis    + TB SKIN TEST   Type 2 diabetes mellitus without complication, without long-term current use of insulin ([place]) 12/06/2015   not on meds    Past Surgical History:  Procedure Laterality Date   ACHILLES TENDON REPAIR  1970's   left ankle  ARTHROSCOPIC REPAIR ACL     left knee cap   BREAST BIOPSY Right 04/08/2013   again in October or November 2020   BREAST LUMPECTOMY Left 12/09/2019   BREAST LUMPECTOMY WITH RADIOACTIVE SEED AND SENTINEL LYMPH NODE BIOPSY Left 12/09/2019   Procedure: LEFT BREAST LUMPECTOMY WITH RADIOACTIVE SEED AND LEFT AXILLARY SENTINEL LYMPH NODE BIOPSY;  Surgeon: Rolm Bookbinder, MD;  Location: [facility];  Service: General;  Laterality: Left;  PEC BLOCK   CARDIAC CATHETERIZATION     loop recorder   CARPAL TUNNEL RELEASE  unknown   left hand   COLONOSCOPY     ESOPHAGOGASTRODUODENOSCOPY (EGD) WITH PROPOFOL N/A 10/29/2017   Procedure: ESOPHAGOGASTRODUODENOSCOPY (EGD) WITH PROPOFOL;  Surgeon: Irene Shipper, MD;  Location: [facility];  Service: Endoscopy;  Laterality: N/A;   LOOP RECORDER IMPLANT     PORT-A-CATH REMOVAL N/A 12/09/2019   Procedure: REMOVAL PORT-A-CATH;  Surgeon: Rolm Bookbinder, MD;  Location: [facility];  Service: General;  Laterality: N/A;   PORTACATH PLACEMENT N/A 09/23/2019   Procedure: INSERTION PORT-A-CATH Right Internal Doreen Salvage;  Surgeon:  Rolm Bookbinder, MD;  Location: [facility] OR;  Service: General;  Laterality: N/A;   post ganglionectomy  1970's   "for migraine headaches"   pouch string  58,31,49   "did this 3 times (once w/each pregnancy)"   SAVORY DILATION N/A 10/29/2017   Procedure: SAVORY DILATION;  Surgeon: Irene Shipper, MD;  Location: [facility];  Service: Endoscopy;  Laterality: N/A;   TOTAL KNEE ARTHROPLASTY Left 09/25/2016   Procedure: LEFT TOTAL KNEE ARTHROPLASTY;  Surgeon: Paralee Cancel, MD;  Location: [facility];  Service: Orthopedics;  Laterality: Left;   TUBAL LIGATION  1980's   Patient Active Problem List   Diagnosis Date Noted   Spondylosis without myelopathy or radiculopathy, cervical region 04/21/2021   Neutropenia with fever ([place]) 10/01/2019   Genetic testing 09/17/2019   Family history of melanoma    Family history of pancreatic cancer    Malignant neoplasm of upper-outer quadrant of left breast in female, estrogen receptor negative ([place]) 09/07/2019   Encounter for loop recorder at end of battery life 04/24/2018   Esophageal stricture 07/01/2017   Hyperlipidemia associated with type 2 diabetes mellitus ([place]) 05/20/2017   Allergic rhinitis 03/11/2017   Dilated cardiomyopathy ([place]) 02/07/2017   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 11/13/2016   Morbid obesity due to excess calories ([place]) 09/26/2016   Increased endometrial stripe thickness 06/27/2016   Intramural leiomyoma of uterus 06/27/2016   Type 2 diabetes mellitus without complication, without long-term current use of insulin ([place]) 12/06/2015   Hypertension associated with diabetes ([place]) 07/19/2015   Arrhythmia 07/19/2015   Orthostatic hypotension 07/19/2015   GERD (gastroesophageal reflux disease) 07/19/2015   Chronic back pain 07/19/2015   Neuropathy 07/19/2015   Symptomatic PVCs 11/02/2014   Syncope 11/02/2014   Chest pain 12/23/2013   Disorder of cervix 03/10/2013   Vaginal atrophy 03/10/2013   OSA (obstructive sleep apnea)  07/31/2012   Asthma 04/29/2012   Coronary artery disease 11/20/2011    REFERRING DIAG: Cervicogenic headache  THERAPY DIAG:  Cervicalgia  Cramp and spasm  SUBJECTIVE:  SUBJECTIVE STATEMENT: I haven't had a migraine HA this week. The TPDN the last session was helpful. The HEP helps me out a lot.  PAIN:  Are you having pain? Yes: NPRS scale: 0-2/10 with most ROMs , 4/10 with tightness at the end range of motion on l with L rotation Pain location: neck and head Pain description: upper neck and HA, constant pain Aggravating factors: Noise, sunlight Relieving factors: Excedrian migraine, sitting down and resting  PERTINENT HISTORY:  Arthritis, Hx of HAs, DM2, anxiety and depression, angina, Afib, CHF, chronic respiratory failure, Hx MI, cancer, posterior ganglionectomy 1970s, Hx of LBP   PRECAUTIONS: None   WEIGHT BEARING RESTRICTIONS No   OCCUPATION: Disability   PLOF: Independent   PATIENT GOALS To decrease HAs   OBJECTIVE:    DIAGNOSTIC FINDINGS:  Cervical MRI 03/20/21 IMPRESSION: 1. 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 Finding could serve as a source of patient's symptoms. 2. Left paracentral disc osteophyte complex at C5-6 with resultant mild canal and moderate left C6 foraminal stenosis. 3. Moderate left-sided facet hypertrophy at C3-4 with resultant mild left C4 foraminal stenosis. 4. 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   PATIENT SURVEYS:  FOTO TBA   COGNITION: Overall cognitive status: Within functional limits for tasks assessed   SENSATION: WFL   POSTURE:  Forward head c CT step off and neck hump, rounded shoulders   PALPATION: TTP of the bilat upper trap, levator, cervical paraspinals and  suboccipitals L>R   CERVICAL ROM:    Active ROM A/PROM (deg) 03/01/2022 AROM 03/16/22 AROM 04/03/22 AROM 04/12/22  Flexion 40 sharp L pain on return     Extension 18 sharp pain L     Right lateral flexion 20 pulling pain L  24   Left lateral flexion 18 sharp pain L  38   Right rotation 40 no pain 47 45 55  Left rotation 30 sharp pain L 38 38 55   (Blank rows = not tested)   UE ROM:            WNLs bilat   UE MMT: 4+ to 5/5 and equal L and R   TODAY'S TREATMENT:  [facility] Adult PT Treatment:                                                DATE: 04/12/22 herapeutic Exercise: Supine cervical retraction x5 5" and DNF liftoffs x5 5" Seated cervical rotation with retractions x3 5 sec  Seated cervical lat flex with retractions x3 5 sec Seated cervical rotation c pt over pressure x3 10 sec Shoulder row bilat 3x10 GTB Shoulder Hor ext 3x10 GTB  Manual Therapy: STM and DTM to the bilat upper trap, cervial paraspinals and suboccipitals Suboccipital release Grade ll UPAs C2-C5 C/R ROM/mob L and R for side bending  [facility] Adult PT Treatment:                                                DATE: 04/10/22 Therapeutic Exercise: Supine cervical retraction x5 5" and DNF liftoffs x5 5" Seated cervical rotation with retractions x3 5 sec  Seated cervical lat flex with retractions x3 5 sec Saeted cervical rotation  c pt over pressure x3 10 sec Seated shoulder ER bilat 3x10 RTB Seated shoulder Hor Abd 3x10 GTB Updated HEP Manual Therapy: STM and DTM to the bilat upper trap, cervial paraspinals and suboccipitals Suboccipital release Grade ll UPAs C2-C5 Trigger Point Dry Needling Treatment: Skilled palpation for identification TPs and taut muscles Pre-treatment instruction: Patient instructed on dry needling rationale, procedures, and possible side effects including pain during treatment (achy,cramping feeling), bruising, drop of blood, lightheadedness, nausea, sweating. Patient Consent Given:  Yes Education handout provided: Yes Muscles treated: multifidi, splenius capitus Needle size and number: .30x35m 1 .30x584m1 Electrical stimulation performed: No Parameters: N/A Treatment response/outcome: Twitch response elicited and Palpable decrease in muscle tension Post-treatment instructions: Patient instructed to expect possible mild to moderate muscle soreness later today and/or tomorrow. Patient instructed in methods to reduce muscle soreness and to continue prescribed HEP. If patient was dry needled over the lung field, patient was instructed on signs and symptoms of pneumothorax and, however unlikely, to see immediate medical attention should they occur. Patient was also educated on signs and symptoms of infection and to seek medical attention should they occur. Patient verbalized understanding of these instructions and educati   [facility]                                                [facility]: 04[facility]22 Therapeutic Exercise: UBE level  2, 2 min each way  Supine  DNF liftoffs x 13 5" Seated cervical rotation with retractions x3 5 sec  Seated cervical lat flex with retractions x3 5 sec Seated shoulder ER bilat 2x15 RTB Seated shoulder Hor Abd 2x15 RTB   Manual Therapy: STM and DTM to the bilat upper trap, cervial paraspinals and suboccipitals Suboccipital release -2 tennis balls placed at occiput x 3 minutes then with neck retractions x 10 for tack and stretch   PATIENT EDUCATION:  Education details: Eval findings, POC, HEP Person educated: Patient Education method: Explanation, Demonstration, Tactile cues, Verbal cues, and Handouts Education comprehension: verbalized understanding, returned demonstration, verbal cues required, and tactile cues required     HOME EXERCISE PROGRAM: Access Code: [email or web address] URL: [email or web address] Date: 04/03/2022 Prepared by: AlGar PontoExercises - Seated Passive Cervical Retraction  - 6 x daily - 7 x weekly -  1 sets - 3-5 reps - 3 hold - Supine Cervical Retraction with Towel  - 6 x daily - 7 x weekly - 1 sets - 3-5 reps - 3 hold - Supine DNF Liftoffs  - 1 x daily - 7 x weekly - 1 sets - 5 reps - 5 hold - Standing Cervical Retraction with Sidebending  - 2 x daily - 7 x weekly - 1 sets - 5 reps - 5 hold - Seated Cervical Retraction and Rotation  - 2 x daily - 7 x weekly - 1 sets - 5 reps - 5 hold - Seated Assisted Cervical Rotation with Towel  - 1 x daily - 7 x weekly - 1 sets - 5 reps - 5 hold - Shoulder External Rotation and Scapular Retraction with Resistance  - 1 x daily - 7 x weekly - 2 sets - 10-15 reps - 3 hold - Standing Shoulder Horizontal Abduction with Resistance  - 1 x daily - 7 x weekly - 2 sets - 10-15 reps - 3 hold   ASSESSMENT:  CLINICAL IMPRESSION: Pt continues to respond well to PT interventions with manual care, TPDN,  therex for DNF and posterior chain strengthening, and cervical ROM. Pt's HA and neck pain continue to decrease, while cervical ROM is increasing. Pt is finding the HEP beneficial for decreasing her symptoms. Pt will continue to benefit from skilled PT to maximize neck function with less pain.    OBJECTIVE IMPAIRMENTS decreased activity tolerance, decreased balance, decreased ROM, decreased strength, impaired flexibility, postural dysfunction, obesity, and pain.     REHAB POTENTIAL: Fair considering the chronicity of the pt's issue     GOALS: Goals reviewed with patient?    SHORT TERM GOALS=LTGs     LONG TERM GOALS: Target date: 04/20/2022   Increase pt's cervical ROM by 10d for improved neck function. 04/03/22: Improving see flow sheets 04/12/22: 55d bilat Baseline: see flow sheets Goal status: MET for rotation   2.  Pt will demonstrate proper sitting posture Baseline: Forwrad head and rounded sholders Goal status: INITIAL   3.  Pt will report a decrease in the intensity of neck and HA pain to 4/10 or less and with a 50% decrease in frequency Baseline:  6/-10/10 and constant Goal status: INITIAL   4.  Improve pt's FOTO score to the predicted value Baseline: TBA Goal status: Deferred. Not captured during the 1st 2 visits   5.  Pt will be Ind a final HEP to maintain achieved LOF Baseline: Provided on eval  Goal status: Ongoing     PLAN: PT FREQUENCY: 2x/week   PT DURATION: 2 weeks   PLANNED INTERVENTIONS: Therapeutic exercises, Therapeutic activity, Neuromuscular re-education, Balance training, Patient/Family education, Joint mobilization, Dry Needling, Electrical stimulation, Spinal manipulation, Spinal mobilization, Cryotherapy, Moist heat, Taping, Traction, Ionotophoresis 44m/ml Dexamethasone, and Manual therapy   PLAN FOR NEXT SESSION: Use manual therapy, modalities, and TPDN as indicated. Progress therex as indicated. Complete re-assessment to determine course of care.  Cresencia Asmus MS, PT 04/12/22 4:20 PM  PHYSICAL THERAPY DISCHARGE SUMMARY  Visits from Start of Care: 8  Current functional level related to goals / functional outcomes:   Remaining deficits: Making progress-See clinical impression and PT goals.    Education / Equipment: See clinical impression and PT goals.    Patient agrees to discharge. Patient goals were partially met. Patient is being discharged due to not returning since the last visit.  Syana Degraffenreid MS, PT 10/31/22 1:51 PM

## 2022-04-12 ENCOUNTER — Ambulatory Visit: Payer: Medicare Other

## 2022-04-12 DIAGNOSIS — M542 Cervicalgia: Secondary | ICD-10-CM

## 2022-04-12 DIAGNOSIS — R252 Cramp and spasm: Secondary | ICD-10-CM

## 2022-04-16 ENCOUNTER — Ambulatory Visit (INDEPENDENT_AMBULATORY_CARE_PROVIDER_SITE_OTHER): Payer: Medicare Other

## 2022-04-16 DIAGNOSIS — E1169 Type 2 diabetes mellitus with other specified complication: Secondary | ICD-10-CM

## 2022-04-16 DIAGNOSIS — E119 Type 2 diabetes mellitus without complications: Secondary | ICD-10-CM

## 2022-04-16 DIAGNOSIS — I2510 Atherosclerotic heart disease of native coronary artery without angina pectoris: Secondary | ICD-10-CM

## 2022-04-16 DIAGNOSIS — E1159 Type 2 diabetes mellitus with other circulatory complications: Secondary | ICD-10-CM

## 2022-04-16 DIAGNOSIS — I420 Dilated cardiomyopathy: Secondary | ICD-10-CM

## 2022-04-16 NOTE — Chronic Care Management (AMB) (Signed)
?Chronic Care Management  ? ?CCM RN Visit Note ? ?04/16/2022 ?Name: Shelby Anderson MRN: [id number] DOB: [birth year] ? ?Subjective: ?Shelby Anderson is a 66 y.o. year old female who is a primary care patient of Koberlein, Steele Berg, MD. The care management team was consulted for assistance with disease management and care coordination needs.   ? ?Engaged with patient by telephone for follow up visit in response to provider referral for case management and/or care coordination services.  ? ?Consent to Services:  ?The patient was given information about Chronic Care Management services, agreed to services, and gave verbal consent prior to initiation of services.  Please see initial visit note for detailed documentation.  ? ?Patient agreed to services and verbal consent obtained.  ? ?Assessment: Review of patient past medical history, allergies, medications, health status, including review of consultants reports, laboratory and other test data, was performed as part of comprehensive evaluation and provision of chronic care management services.  ? ?SDOH (Social Determinants of Health) assessments and interventions performed:   ? ?CCM Care Plan ? ?Allergies  ?Allergen Reactions  ? Lodine [Etodolac] Anaphylaxis, Hives and Swelling  ? Oxycontin [Oxycodone Hcl] Anaphylaxis  ?  hives, trouble breathing, tongue swelling (Only Oxycontin) Tolerates plain oxycodone.  ? Penicillins Anaphylaxis  ?  Told by a surgeon never to take it again. ?Has patient had a PCN reaction causing immediate rash, facial/tongue/throat swelling, SOB or lightheadedness with hypotension: Yes ?Has patient had a PCN reaction causing severe rash involving mucus membranes or skin necrosis: Unknown ?Has patient had a PCN reaction that required hospitalization: No ?Has patient had a PCN reaction occurring within the last 10 years: No ?If all of the above answers are "NO", then may proceed with Cephalosporin use.  ? Aspirin Other (See Comments)  ?   High-dose caused GI Bleeds  ? Darvocet [Propoxyphene N-Acetaminophen] Hives  ? Nitroglycerin Other (See Comments)  ?  IV-BP drops dramatically ?Can take po  ? Tramadol Hives and Itching  ? Ultram [Tramadol Hcl] Hives  ? Valium Other (See Comments)  ?  Circulation problems. "Legs turned black".  ? ? ?Outpatient Encounter Medications as of 04/16/2022  ?Medication Sig  ? albuterol (PROVENTIL) (2.5 MG/3ML) 0.083% nebulizer solution Take 3 mLs (2.5 mg total) by nebulization every 6 (six) hours as needed for wheezing or shortness of breath (J45.40).  ? albuterol (VENTOLIN HFA) 108 (90 Base) MCG/ACT inhaler Inhale 2 puffs into the lungs every 6 (six) hours as needed for wheezing or shortness of breath.  ? aspirin EC 81 MG tablet Take 81 mg at bedtime by mouth.   ? atorvastatin (LIPITOR) 80 MG tablet Take 1 tablet (80 mg total) by mouth daily.  ? Blood Glucose Monitoring Suppl (ONE TOUCH ULTRA 2) w/Device KIT Use as directed  ? budesonide-formoterol (SYMBICORT) 80-4.5 MCG/ACT inhaler INHALE 2 PUFFS INTO THE LUNGS EVERY MORNING AND ANOTHER 2 PUFFS 12 HOURS LATER  ? budesonide-formoterol (SYMBICORT) 80-4.5 MCG/ACT inhaler Inhale 2 puffs into the lungs every morning and another 2 puffs 12 hours later.  ? buPROPion (WELLBUTRIN XL) 150 MG 24 hr tablet TAKE 1 TABLET BY MOUTH EVERY DAY  ? Calcium Carbonate-Vitamin D 600-200 MG-UNIT TABS Take 1 tablet by mouth daily.  ? CINNAMON PO Take 1,000 mg 2 (two) times daily by mouth.  ? doxepin (SINEQUAN) 10 MG capsule Take 30 mg by mouth at bedtime.  ? empagliflozin (JARDIANCE) 25 MG TABS tablet Take 1 tablet (25 mg total) by mouth daily before breakfast.  ?  famotidine (PEPCID) 20 MG tablet TAKE 1 TABLET (20 MG TOTAL) BY MOUTH 2 (TWO) TIMES DAILY AS NEEDED FOR HEARTBURN OR INDIGESTION.  ? fluticasone (FLONASE) 50 MCG/ACT nasal spray Place 2 sprays at bedtime as needed into both nostrils for allergies or rhinitis.   ? furosemide (LASIX) 20 MG tablet TAKE 1 OR 2 TABLETS BY MOUTH EVERY DAY AS  DIRECTED  ? glipiZIDE (GLUCOTROL XL) 5 MG 24 hr tablet TAKE 1 TABLET BY MOUTH EVERYDAY WITH BREAKFAST. **DUE FOR YEARLY PHYSICAL**  ? hydrALAZINE (APRESOLINE) 25 MG tablet Take 1 tablet (25 mg total) by mouth daily as needed (for BP >140/90 mmHg, up to three times daily).  ? hydrocortisone 2.5 % cream Apply topically as needed.  ? isosorbide mononitrate (IMDUR) 60 MG 24 hr tablet Take by mouth.  ? L-Methylfolate (DEPLIN) 7.5 MG TABS Take 7.5 mg by mouth daily with breakfast.  ? Lancets (ONETOUCH DELICA PLUS [id number]) MISC USE TO CHECK BLOOD SUGAR 2 TIMES A DAY  ? Lancets (ONETOUCH DELICA PLUS [id number]) MISC USE AS DIRECTED  ? levothyroxine (SYNTHROID) 75 MCG tablet Take 1 tablet (75 mcg total) by mouth daily before breakfast.  ? lisinopril (ZESTRIL) 5 MG tablet Take 1 tablet (5 mg total) by mouth daily.  ? LYRICA 150 MG capsule Take 150 mg by mouth 3 (three) times daily.   ? metFORMIN (GLUCOPHAGE) 500 MG tablet TAKE 1 TABLET BY MOUTH TWICE A DAY WITH MEALS  ? metoprolol succinate (TOPROL-XL) 25 MG 24 hr tablet TAKE 1 TABLET BY MOUTH DAILY. TAKE WITH OR IMMEDIATELY FOLLOWING A MEAL.  ? Misc Natural Products (GLUCOS-CHONDROIT-MSM COMPLEX PO) Take 2 tablets by mouth daily.  ? mometasone (ELOCON) 0.1 % cream Apply topically.  ? montelukast (SINGULAIR) 10 MG tablet TAKE 1 TABLET BY MOUTH EVERYDAY AT BEDTIME  ? Multiple Vitamin (MULITIVITAMIN WITH MINERALS) TABS Take 1 tablet by mouth daily.  ? mupirocin cream (BACTROBAN) 2 % Apply once daily to lesions on feet as needed.  ? NEXIUM 40 MG capsule TAKE 1 CAPSULE BY MOUTH TWICE A DAY  ? nitroGLYCERIN (NITROSTAT) 0.4 MG SL tablet Place 1 tablet (0.4 mg total) under the tongue every 5 (five) minutes as needed for chest pain.  ? ondansetron (ZOFRAN) 4 MG tablet Take 1 tablet (4 mg total) by mouth every 8 (eight) hours as needed for nausea or vomiting.  ? ONETOUCH ULTRA test strip USE TO CHECK BLOOD SUGAR 2 TIMES A DAY  ? oxyCODONE (ROXICODONE) 15 MG immediate release tablet  Take 15 mg by mouth 5 (five) times daily.  ? potassium chloride (KLOR-CON) 10 MEQ tablet TAKE 2 TABLETS BY MOUTH EVERY DAY  ? promethazine (PHENERGAN) 25 MG tablet TAKE 1 TABLET BY MOUTH EVERYDAY AT BEDTIME  ? tiZANidine (ZANAFLEX) 4 MG tablet Take 4 mg by mouth every 8 (eight) hours as needed for muscle spasms.  ? vitamin B-12 (CYANOCOBALAMIN) 500 MCG tablet Take 500 mcg by mouth daily.   ? vitamin E 200 UNIT capsule Take 200 Units by mouth daily.  ? ?No facility-administered encounter medications on file as of 04/16/2022.  ? ? ?Patient Active Problem List  ? Diagnosis Date Noted  ? Spondylosis without myelopathy or radiculopathy, cervical region 04/21/2021  ? Neutropenia with fever ([place]) 10/01/2019  ? Genetic testing 09/17/2019  ? Family history of melanoma   ? Family history of pancreatic cancer   ? Malignant neoplasm of upper-outer quadrant of left breast in female, estrogen receptor negative ([place]) 09/07/2019  ? Encounter for loop recorder at  end of battery life 04/24/2018  ? Esophageal stricture 07/01/2017  ? Hyperlipidemia associated with type 2 diabetes mellitus ([place]) 05/20/2017  ? Allergic rhinitis 03/11/2017  ? Dilated cardiomyopathy ([place]) 02/07/2017  ? 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 Morbid obesity due to excess calories ([place]) 09/26/2016  ? Increased endometrial stripe thickness 06/27/2016  ? Intramural leiomyoma of uterus 06/27/2016  ? Type 2 diabetes mellitus without complication, without long-term current use of insulin ([place]) 12/06/2015  ? Hypertension associated with diabetes ([place]) 07/19/2015  ? Arrhythmia 07/19/2015  ? Orthostatic hypotension 07/19/2015  ? GERD (gastroesophageal reflux disease) 07/19/2015  ? Chronic back pain 07/19/2015  ? Neuropathy 07/19/2015  ? Symptomatic PVCs 11/02/2014  ? Syncope 11/02/2014  ? Chest pain 12/23/2013  ? Disorder of cervix 03/10/2013  ? Vaginal atrophy 03/10/2013  ? OSA (obstructive sleep apnea) 07/31/2012  ? Asthma 04/29/2012  ?  Coronary artery disease 11/20/2011  ? ? ?Conditions to be addressed/monitored:CHF, CAD, HTN, HLD, DMII, and chronic pain ? ?Care Plan : RN Care Manager Plan of Care  ?Updates made by Dimitri Ped, RN since 5

## 2022-04-16 NOTE — Patient Instructions (Signed)
Visit Information ? ?Thank you for taking time to visit with me today. Please don't hesitate to contact me if I can be of assistance to you before our next scheduled telephone appointment. ? ?Following are the goals we discussed today:  ?Take all medications as prescribed ?Attend all scheduled provider appointments ?Call pharmacy for medication refills 3-7 days in advance of running out of medications ?Call provider office for new concerns or questions  ?call office if I gain more than 2 pounds in one day or 5 pounds in one week ?keep legs up while sitting ?watch for swelling in feet, ankles and legs every day ?weigh myself daily ?develop a rescue plan ?follow rescue plan if symptoms flare-up ?eat more whole grains, fruits and vegetables, lean meats and healthy fats ?keep appointment with eye doctor ?check blood sugar at prescribed times: twice daily and when you have symptoms of low or high blood sugar ?take the blood sugar log to all doctor visits ?fill half of plate with vegetables ?manage portion size ?wash and dry feet carefully every day ?check blood pressure daily ?choose a place to take my blood pressure (home, clinic or office, retail store) ?write blood pressure results in a log or diary ?take blood pressure log to all doctor appointments ?limit salt intake to '2300mg'$ /day ?call for medicine refill 2 or 3 days before it runs out ?take all medications exactly as prescribed ?call doctor with any symptoms you believe are related to your medicine ? ?Our next appointment is by telephone on 06/11/22 at 3 PM ? ?Please call the care guide team at [phone] if you need to cancel or reschedule your appointment.  ? ?If you are experiencing a Mental Health or [place] or need someone to talk to, please call the Suicide and Crisis Lifeline: 988 ?call the [place] National Suicide Prevention Lifeline: 939-408-5988 or TTY: 425-770-4667 TTY (437)175-1509) to talk to a trained counselor ?call 1-800-273-TALK  (toll free, 24 hour hotline) ?go to [facility] [address], [place] [phone]) ?call 911  ? ?Patient verbalizes understanding of instructions and care plan provided today and agrees to view in [place]. Active MyChart status confirmed with patient.   ? ?Peter Garter RN, BSN,CCM, CDE ?Care Management Coordinator ?[facility] ?[phone]   ?

## 2022-04-17 DIAGNOSIS — D3132 Benign neoplasm of left choroid: Secondary | ICD-10-CM | POA: Diagnosis not present

## 2022-04-17 DIAGNOSIS — G4733 Obstructive sleep apnea (adult) (pediatric): Secondary | ICD-10-CM | POA: Diagnosis not present

## 2022-04-17 DIAGNOSIS — H04123 Dry eye syndrome of bilateral lacrimal glands: Secondary | ICD-10-CM | POA: Diagnosis not present

## 2022-04-17 DIAGNOSIS — E119 Type 2 diabetes mellitus without complications: Secondary | ICD-10-CM | POA: Diagnosis not present

## 2022-04-17 DIAGNOSIS — H2513 Age-related nuclear cataract, bilateral: Secondary | ICD-10-CM | POA: Diagnosis not present

## 2022-04-17 DIAGNOSIS — J45909 Unspecified asthma, uncomplicated: Secondary | ICD-10-CM | POA: Diagnosis not present

## 2022-04-17 LAB — HM DIABETES EYE EXAM

## 2022-04-18 NOTE — Telephone Encounter (Addendum)
Called patient back to make her aware of the need to reapply for LIS in order to qualify/re-apply for Jardiance patient assistant. Filled out application online. Let patient know to pick up samples of Jardiance in the office as her earliest convenience.

## 2022-04-19 ENCOUNTER — Encounter: Payer: Self-pay | Admitting: Family Medicine

## 2022-04-19 ENCOUNTER — Telehealth: Payer: Self-pay | Admitting: Pharmacist

## 2022-04-19 ENCOUNTER — Telehealth: Payer: Self-pay | Admitting: Internal Medicine

## 2022-04-19 MED ORDER — FAMOTIDINE 20 MG PO TABS: 20.0000 mg | ORAL_TABLET | Freq: Two times a day (BID) | ORAL | 1 refills | Status: DC | PRN

## 2022-04-19 MED ORDER — ONDANSETRON HCL 4 MG PO TABS: 4.0000 mg | ORAL_TABLET | Freq: Three times a day (TID) | ORAL | 2 refills | Status: DC | PRN

## 2022-04-19 NOTE — Telephone Encounter (Signed)
We received a call from pharmacy stating patient is switching pharmacies and are requesting medications (Zofran & Pepcid) be [facility]. Please advise.

## 2022-04-19 NOTE — Telephone Encounter (Signed)
Pepcid and Zofran sent to [facility]

## 2022-04-19 NOTE — Chronic Care Management (AMB) (Addendum)
Chronic Care Management Pharmacy Assistant   Name: CARLIA BOMKAMP  MRN: [id number] DOB: 02-06-1956  Reason for Encounter: Onboarding  Recent office visits:  None  Recent consult visits:  04/12/2022 Gar Ponto PT - Patient was seen for PT treatment. Treatment plan 2 times per week for 2 weeks.   04/10/2022 Gar Ponto PT - Patient was seen for PT treatment. Treatment plan 2 times per week for 2 weeks.  04/05/2022 Hessie Diener PTA - Patient was seen for PT treatment. Treatment plan 2 times per week for 2 weeks.  04/03/2022 Gar Ponto PT - Patient was seen for PT treatment. Treatment plan 2 times per week for 2 weeks.  Hospital visits:  None  Medications: Outpatient Encounter Medications as of 04/19/2022  Medication Sig   albuterol (PROVENTIL) (2.5 MG/3ML) 0.083% nebulizer solution Take 3 mLs (2.5 mg total) by nebulization every 6 (six) hours as needed for wheezing or shortness of breath (J45.40).   albuterol (VENTOLIN HFA) 108 (90 Base) MCG/ACT inhaler Inhale 2 puffs into the lungs every 6 (six) hours as needed for wheezing or shortness of breath.   aspirin EC 81 MG tablet Take 81 mg at bedtime by mouth.    atorvastatin (LIPITOR) 80 MG tablet Take 1 tablet (80 mg total) by mouth daily.   Blood Glucose Monitoring Suppl (ONE TOUCH ULTRA 2) w/Device KIT Use as directed   budesonide-formoterol (SYMBICORT) 80-4.5 MCG/ACT inhaler INHALE 2 PUFFS INTO THE LUNGS EVERY MORNING AND ANOTHER 2 PUFFS 12 HOURS LATER   budesonide-formoterol (SYMBICORT) 80-4.5 MCG/ACT inhaler Inhale 2 puffs into the lungs every morning and another 2 puffs 12 hours later.   buPROPion (WELLBUTRIN XL) 150 MG 24 hr tablet TAKE 1 TABLET BY MOUTH EVERY DAY   Calcium Carbonate-Vitamin D 600-200 MG-UNIT TABS Take 1 tablet by mouth daily.   CINNAMON PO Take 1,000 mg 2 (two) times daily by mouth.   doxepin (SINEQUAN) 10 MG capsule Take 30 mg by mouth at bedtime.   empagliflozin (JARDIANCE) 25 MG TABS tablet Take  1 tablet (25 mg total) by mouth daily before breakfast.   famotidine (PEPCID) 20 MG tablet TAKE 1 TABLET (20 MG TOTAL) BY MOUTH 2 (TWO) TIMES DAILY AS NEEDED FOR HEARTBURN OR INDIGESTION.   fluticasone (FLONASE) 50 MCG/ACT nasal spray Place 2 sprays at bedtime as needed into both nostrils for allergies or rhinitis.    furosemide (LASIX) 20 MG tablet TAKE 1 OR 2 TABLETS BY MOUTH EVERY DAY AS DIRECTED   glipiZIDE (GLUCOTROL XL) 5 MG 24 hr tablet TAKE 1 TABLET BY MOUTH EVERYDAY WITH BREAKFAST. **DUE FOR YEARLY PHYSICAL**   hydrALAZINE (APRESOLINE) 25 MG tablet Take 1 tablet (25 mg total) by mouth daily as needed (for BP >140/90 mmHg, up to three times daily).   hydrocortisone 2.5 % cream Apply topically as needed.   isosorbide mononitrate (IMDUR) 60 MG 24 hr tablet Take by mouth.   L-Methylfolate (DEPLIN) 7.5 MG TABS Take 7.5 mg by mouth daily with breakfast.   Lancets (ONETOUCH DELICA PLUS [id number]) MISC USE TO CHECK BLOOD SUGAR 2 TIMES A DAY   Lancets (ONETOUCH DELICA PLUS [id number]) MISC USE AS DIRECTED   levothyroxine (SYNTHROID) 75 MCG tablet Take 1 tablet (75 mcg total) by mouth daily before breakfast.   lisinopril (ZESTRIL) 5 MG tablet Take 1 tablet (5 mg total) by mouth daily.   LYRICA 150 MG capsule Take 150 mg by mouth 3 (three) times daily.    metFORMIN (GLUCOPHAGE) 500 MG tablet TAKE 1  TABLET BY MOUTH TWICE A DAY WITH MEALS   metoprolol succinate (TOPROL-XL) 25 MG 24 hr tablet TAKE 1 TABLET BY MOUTH DAILY. TAKE WITH OR IMMEDIATELY FOLLOWING A MEAL.   Misc Natural Products (GLUCOS-CHONDROIT-MSM COMPLEX PO) Take 2 tablets by mouth daily.   mometasone (ELOCON) 0.1 % cream Apply topically.   montelukast (SINGULAIR) 10 MG tablet TAKE 1 TABLET BY MOUTH EVERYDAY AT BEDTIME   Multiple Vitamin (MULITIVITAMIN WITH MINERALS) TABS Take 1 tablet by mouth daily.   mupirocin cream (BACTROBAN) 2 % Apply once daily to lesions on feet as needed.   NEXIUM 40 MG capsule TAKE 1 CAPSULE BY MOUTH TWICE A DAY    nitroGLYCERIN (NITROSTAT) 0.4 MG SL tablet Place 1 tablet (0.4 mg total) under the tongue every 5 (five) minutes as needed for chest pain.   ondansetron (ZOFRAN) 4 MG tablet Take 1 tablet (4 mg total) by mouth every 8 (eight) hours as needed for nausea or vomiting.   ONETOUCH ULTRA test strip USE TO CHECK BLOOD SUGAR 2 TIMES A DAY   oxyCODONE (ROXICODONE) 15 MG immediate release tablet Take 15 mg by mouth 5 (five) times daily.   potassium chloride (KLOR-CON) 10 MEQ tablet TAKE 2 TABLETS BY MOUTH EVERY DAY   promethazine (PHENERGAN) 25 MG tablet TAKE 1 TABLET BY MOUTH EVERYDAY AT BEDTIME   tiZANidine (ZANAFLEX) 4 MG tablet Take 4 mg by mouth every 8 (eight) hours as needed for muscle spasms.   vitamin B-12 (CYANOCOBALAMIN) 500 MCG tablet Take 500 mcg by mouth daily.    vitamin E 200 UNIT capsule Take 200 Units by mouth daily.   No facility-administered encounter medications on file as of 04/19/2022.    Referred by: Caren Macadam, MD  Reason for referral: Chronic care management  Reviewed chart for medication changes ahead of medication coordination call.  BP Readings from Last 3 Encounters:  03/19/22 117/74  02/26/22 132/70  02/05/22 122/82    Lab Results  Component Value Date   HGBA1C 7.5 (H) 01/25/2022     Verbal consent obtained for [facility] enhanced pharmacy services (medication synchronization, adherence packaging, delivery coordination). A medication sync plan was created to allow patient to get all medications delivered once every 30 to 90 days per patient preference. Patient understands they have freedom to choose pharmacy and clinical pharmacist will coordinate care between all prescribers and [facility].  Patient requested to obtain medications through Adherence Packaging  30 Days   Med Sync Plan: Fuorsemide 20 mg 1-2 tabs as needed x            Nexium 40 mg  Irene Shipper, MD  1  1   03/29/2022 for 30 DS Apr 28, 2022  Levothyroxine 75 mcg (taking  1.5 of these for now) x   1.5      #90 June 01, 2022  Tizanidine 4 mg  [name], MD  '2 2 2   ' #300 July 11, 2022  L-methylfolate 7.5 mg [name], MD  1     #30 May 02, 2022  Lyrica 150 mg  [name], MD  '1 1 1   ' #90 May 02, 2022  Phenergan 25 mg  Irene Shipper, MD     1  #90 July 01, 2022  Montelukast 10 mg  Rigoberto Noel, MD     1  04/18/2022 for 90 DS July 17, 2022  hydralazine 25 mg  Cantwell, Celeste C, PA-C       PRN last  fill date 04/18/22 #30  Zofran 4 mg  Willia Craze, NP       PRN (no last fill dates)  Potassium 10 meq  x    2     #180 July 01, 2022  metformin 500 mg x    1  1   #80 May 12, 2022  Nitroglycerin 0.4 mg x         PRN (no last fill dates)  Wellbutrin XL 150 mg x    1     #30 May 02, 2022  isosorbide 60 mg  x    1     #30 May 02, 2022  metoprolol 25 mg  Alethia Berthold, [place]  1     01/29/2022 for 90 DS Apr 29, 2022  atrovastatin 80 mg x       1  #90 on 02/22/2022 May 23, 2022  Lisinopril 5 mg x    1     #17 Apr 19, 2022  glipizide 5 mg x    1     02/25/2022 for 90 DS May 26, 2022  Pepcid 20 mg  Willia Craze, NP  1  1   #150 June 16, 2022  Doxepin 10 mg  [name], MD     3  #110 May 08, 2022    Patient will need a short fill of (med), prior to adherence delivery.    [facility], verified with [facility] Nexium last fill date 03/29/2022 30 DS Montelukast last fill date 04/18/2022 90 DS Metoprolol last fill date 01/29/2022 90 DS Glipizide last fill date 02/25/2022 90 DS Hydralazine last fill date 04/18/2022 30DS  [facility] [place] All prescriptions except Zofran and Nitroglycerine will be transferred [facility] by Threasa Beards at [facility] Tye Savoy PA-C) for refill of Zofran to be sent to [facility] (spoke with [facility])   Care Gaps: AWV - scheduled 08/07/2022 Last BP - 117/74 on 03/19/2022 Last A1C - 7.5 on 01/25/2022 Covid booster - overdue Shingix -  postponed Papsmear - postponed  Star Rating Drugs: Atorvastatin 80 mg  - 02/22/2022 [facility] Jardiance 25 mg - 02/26/2022 [facility] Lisinopril 5 mg - 02/05/2022 [facility] Metformin 500 mg - 02/11/2022 90 DS at [place] Pharmacist Assistant [phone]

## 2022-04-23 ENCOUNTER — Telehealth: Payer: Self-pay | Admitting: *Deleted

## 2022-04-23 DIAGNOSIS — E119 Type 2 diabetes mellitus without complications: Secondary | ICD-10-CM

## 2022-04-23 MED ORDER — POTASSIUM CHLORIDE ER 10 MEQ PO TBCR: 20.0000 meq | EXTENDED_RELEASE_TABLET | Freq: Every day | ORAL | 0 refills | Status: DC

## 2022-04-23 MED ORDER — METFORMIN HCL 500 MG PO TABS: 500.0000 mg | ORAL_TABLET | Freq: Two times a day (BID) | ORAL | 0 refills | Status: DC

## 2022-04-23 MED ORDER — LEVOTHYROXINE SODIUM 75 MCG PO TABS
75.0000 ug | ORAL_TABLET | Freq: Every day | ORAL | 0 refills | Status: DC
Start: 2022-04-23 — End: 2022-09-06

## 2022-04-23 MED ORDER — LISINOPRIL 5 MG PO TABS: 5.0000 mg | ORAL_TABLET | Freq: Every day | ORAL | 0 refills | Status: DC

## 2022-04-23 MED ORDER — FUROSEMIDE 20 MG PO TABS
ORAL_TABLET | ORAL | 2 refills | Status: DC
Start: 2022-04-23 — End: 2022-04-25

## 2022-04-23 MED ORDER — ATORVASTATIN CALCIUM 80 MG PO TABS: 80.0000 mg | ORAL_TABLET | Freq: Every day | ORAL | 0 refills | Status: DC

## 2022-04-23 NOTE — Telephone Encounter (Signed)
-----   Message from Viona Gilmore, [facility] sent at 04/20/2022  4:00 PM EDT ----- Regarding: Refills Hi,  [name] is going to try [facility] for the adherence packaging. Can you please send refills of the following to them? -atorvastatin -furosemide -levothyroxine -lisinopril -metformin -potassium chloride  Thank you! Maddie

## 2022-04-23 NOTE — Telephone Encounter (Signed)
Rx done. 

## 2022-04-25 ENCOUNTER — Ambulatory Visit: Payer: Medicare Other

## 2022-04-25 ENCOUNTER — Other Ambulatory Visit: Payer: Self-pay | Admitting: Family Medicine

## 2022-04-27 ENCOUNTER — Ambulatory Visit: Payer: Medicare Other

## 2022-04-29 ENCOUNTER — Other Ambulatory Visit: Payer: Self-pay | Admitting: Internal Medicine

## 2022-04-29 ENCOUNTER — Other Ambulatory Visit: Payer: Self-pay | Admitting: Family Medicine

## 2022-04-29 DIAGNOSIS — E119 Type 2 diabetes mellitus without complications: Secondary | ICD-10-CM

## 2022-05-01 ENCOUNTER — Telehealth: Payer: Self-pay | Admitting: *Deleted

## 2022-05-01 DIAGNOSIS — Z79891 Long term (current) use of opiate analgesic: Secondary | ICD-10-CM | POA: Diagnosis not present

## 2022-05-01 DIAGNOSIS — G894 Chronic pain syndrome: Secondary | ICD-10-CM | POA: Diagnosis not present

## 2022-05-01 DIAGNOSIS — M47816 Spondylosis without myelopathy or radiculopathy, lumbar region: Secondary | ICD-10-CM | POA: Diagnosis not present

## 2022-05-01 DIAGNOSIS — G893 Neoplasm related pain (acute) (chronic): Secondary | ICD-10-CM | POA: Diagnosis not present

## 2022-05-01 MED ORDER — GLIPIZIDE ER 5 MG PO TB24: ORAL_TABLET | ORAL | 0 refills | Status: DC

## 2022-05-01 NOTE — Telephone Encounter (Signed)
-----   Message from Viona Gilmore, [facility] sent at 05/01/2022 12:25 PM EDT ----- Regarding: Glipizide refill Hi,  Ms. Kiaira is running low on glipizide. Is it possible to send a refill to [facility]?  Thanks, Maddie

## 2022-05-01 NOTE — Telephone Encounter (Signed)
Rx done. 

## 2022-05-02 DIAGNOSIS — I110 Hypertensive heart disease with heart failure: Secondary | ICD-10-CM | POA: Diagnosis not present

## 2022-05-02 DIAGNOSIS — I2510 Atherosclerotic heart disease of native coronary artery without angina pectoris: Secondary | ICD-10-CM

## 2022-05-02 DIAGNOSIS — I509 Heart failure, unspecified: Secondary | ICD-10-CM

## 2022-05-02 DIAGNOSIS — E785 Hyperlipidemia, unspecified: Secondary | ICD-10-CM | POA: Diagnosis not present

## 2022-05-02 DIAGNOSIS — J45909 Unspecified asthma, uncomplicated: Secondary | ICD-10-CM | POA: Diagnosis not present

## 2022-05-02 DIAGNOSIS — Z7984 Long term (current) use of oral hypoglycemic drugs: Secondary | ICD-10-CM

## 2022-05-02 DIAGNOSIS — E1159 Type 2 diabetes mellitus with other circulatory complications: Secondary | ICD-10-CM

## 2022-05-07 ENCOUNTER — Telehealth: Payer: Self-pay | Admitting: Pulmonary Disease

## 2022-05-07 ENCOUNTER — Telehealth: Payer: Self-pay | Admitting: Pharmacist

## 2022-05-07 ENCOUNTER — Telehealth: Payer: Self-pay | Admitting: Internal Medicine

## 2022-05-07 NOTE — Chronic Care Management (AMB) (Cosign Needed)
    Chronic Care Management Pharmacy Assistant   Name: Shelby Anderson  MRN: [id number] DOB: 01/08/1956  Reason for Encounter: Calls to physicians for new scripts  Calls made to physicians to fill scripts for onboarding.  [facility].   [name], MD (GI) [phone] New scripts requested with [facility] Phenergan  [name], MD (Pain Mgmt)  [phone] Left a detailed message requesting new prescriptions for medications below to be sent to [facility]. (No option to speak with a person) Tizanidine L Methylfolate Lyrica Doxepin  [name] PA (Cardilolgy) [phone] Spoke with [name] to sent through escribe Hydralazine Metoprolol  [name] DO / [name] MD Message sent to [name] to have scripts sent  Isosorbide Wellbutrin Glipizide  [name] (Pulmonology)  [phone] New script requested with [name] Montelukast   Update to Onboarding: [facility] per [name] Nexium - 05/02/22  30 DS Lyrica - 05/02/22 30 DS Wellbutrin - 04/18/22 90 DS Doxepine - 02/13/22 90 DS  Verified with patient: L Methylfolate - per patient she has #10  Lisinopril - per patient she just picked up #30 on 05/08/22 Isosorbide - overdue for fill - she has been out of this medication for 3 months Metoprolol - per patient she has #10   [name] Pharmacist Assistant [phone]

## 2022-05-07 NOTE — Telephone Encounter (Signed)
Inbound call from pharmacy stating that patient  needs refills for Nexium and Phenergan. Please advise.

## 2022-05-08 ENCOUNTER — Other Ambulatory Visit: Payer: Self-pay | Admitting: Family

## 2022-05-08 ENCOUNTER — Other Ambulatory Visit: Payer: Self-pay

## 2022-05-08 DIAGNOSIS — F339 Major depressive disorder, recurrent, unspecified: Secondary | ICD-10-CM

## 2022-05-08 MED ORDER — HYDRALAZINE HCL 25 MG PO TABS: 25.0000 mg | ORAL_TABLET | Freq: Every day | ORAL | 3 refills | Status: DC | PRN

## 2022-05-08 MED ORDER — PROMETHAZINE HCL 25 MG PO TABS: ORAL_TABLET | ORAL | 1 refills | Status: DC

## 2022-05-08 MED ORDER — ESOMEPRAZOLE MAGNESIUM 40 MG PO CPDR: 40.0000 mg | DELAYED_RELEASE_CAPSULE | Freq: Two times a day (BID) | ORAL | 0 refills | Status: DC

## 2022-05-08 MED ORDER — METOPROLOL SUCCINATE ER 25 MG PO TB24: 25.0000 mg | ORAL_TABLET | Freq: Every day | ORAL | 3 refills | Status: DC

## 2022-05-08 MED ORDER — BUPROPION HCL ER (XL) 150 MG PO TB24: 150.0000 mg | ORAL_TABLET | Freq: Every day | ORAL | 1 refills | Status: DC

## 2022-05-08 NOTE — Telephone Encounter (Signed)
Nexium and Phenergan refilled

## 2022-05-08 NOTE — Telephone Encounter (Signed)
She needs an OV to get further refills.

## 2022-05-09 NOTE — Telephone Encounter (Signed)
Left voicemail for patient to call back to schedule follow up with Dr. Elsworth Soho.

## 2022-05-10 ENCOUNTER — Ambulatory Visit: Payer: Medicare Other | Admitting: Family

## 2022-05-11 ENCOUNTER — Ambulatory Visit (INDEPENDENT_AMBULATORY_CARE_PROVIDER_SITE_OTHER): Payer: Medicare Other | Admitting: Family Medicine

## 2022-05-11 ENCOUNTER — Encounter: Payer: Self-pay | Admitting: Family Medicine

## 2022-05-11 VITALS — BP 108/64 | HR 63 | Temp 98.6°F | Wt 213.0 lb

## 2022-05-11 DIAGNOSIS — E119 Type 2 diabetes mellitus without complications: Secondary | ICD-10-CM | POA: Diagnosis not present

## 2022-05-11 DIAGNOSIS — B3731 Acute candidiasis of vulva and vagina: Secondary | ICD-10-CM | POA: Diagnosis not present

## 2022-05-11 MED ORDER — FLUCONAZOLE 150 MG PO TABS: 150.0000 mg | ORAL_TABLET | Freq: Once | ORAL | 5 refills | Status: AC

## 2022-05-11 NOTE — Progress Notes (Signed)
   Subjective:    Patient ID: Shelby Anderson, female    DOB: March 30, 1956, 67 y.o.   MRN: [id number]  HPI Here for what appears to be her very first vaginal yeast infection. For the past week she has had burning and itching in the vaginal area with a scant DC. She was started on Jardiance in March, and the dose has been titrated up since then. She was told that a yeast infection could be a side effect of this. Her last A1c in February was 7.5, but her am fasting glucoses have been very well controlled lately (usually 100-120).    Review of Systems  Constitutional: Negative.   Respiratory: Negative.    Cardiovascular: Negative.        Objective:   Physical Exam Constitutional:      Appearance: Normal appearance. She is not ill-appearing.  Cardiovascular:     Rate and Rhythm: Normal rate and regular rhythm.     Pulses: Normal pulses.     Heart sounds: Normal heart sounds.  Pulmonary:     Effort: Pulmonary effort is normal.     Breath sounds: Normal breath sounds.  Neurological:     Mental Status: She is alert.           Assessment & Plan:  Yeast vaginitis, possibly as a side effect of Jardiance. She will use Diflucan as needed. If this continues, we may need to stop the Jardiance and try something else.  Alysia Penna, MD

## 2022-05-12 ENCOUNTER — Other Ambulatory Visit: Payer: Self-pay | Admitting: Family

## 2022-05-12 MED ORDER — ISOSORBIDE MONONITRATE ER 60 MG PO TB24: 60.0000 mg | ORAL_TABLET | Freq: Every day | ORAL | 0 refills | Status: DC

## 2022-05-17 DIAGNOSIS — Z171 Estrogen receptor negative status [ER-]: Secondary | ICD-10-CM | POA: Diagnosis not present

## 2022-05-17 DIAGNOSIS — C50412 Malignant neoplasm of upper-outer quadrant of left female breast: Secondary | ICD-10-CM | POA: Diagnosis not present

## 2022-05-28 ENCOUNTER — Ambulatory Visit: Payer: Medicare Other

## 2022-05-30 ENCOUNTER — Other Ambulatory Visit: Payer: Self-pay | Admitting: Adult Health

## 2022-05-30 DIAGNOSIS — J9611 Chronic respiratory failure with hypoxia: Secondary | ICD-10-CM

## 2022-05-30 DIAGNOSIS — J45991 Cough variant asthma: Secondary | ICD-10-CM

## 2022-05-31 ENCOUNTER — Ambulatory Visit: Payer: Self-pay

## 2022-05-31 DIAGNOSIS — Z79891 Long term (current) use of opiate analgesic: Secondary | ICD-10-CM | POA: Diagnosis not present

## 2022-05-31 DIAGNOSIS — E119 Type 2 diabetes mellitus without complications: Secondary | ICD-10-CM

## 2022-05-31 DIAGNOSIS — G893 Neoplasm related pain (acute) (chronic): Secondary | ICD-10-CM | POA: Diagnosis not present

## 2022-05-31 DIAGNOSIS — E1159 Type 2 diabetes mellitus with other circulatory complications: Secondary | ICD-10-CM

## 2022-05-31 DIAGNOSIS — M47816 Spondylosis without myelopathy or radiculopathy, lumbar region: Secondary | ICD-10-CM | POA: Diagnosis not present

## 2022-05-31 DIAGNOSIS — G894 Chronic pain syndrome: Secondary | ICD-10-CM | POA: Diagnosis not present

## 2022-05-31 NOTE — Chronic Care Management (AMB) (Signed)
Chronic Care Management   CCM RN Visit Note  05/31/2022 Name: Shelby Anderson MRN: [id number] DOB: 05-29-1956  Subjective: Shelby Anderson is a 66 y.o. year old female who is a primary care patient of Koberlein, Steele Berg, MD (Inactive). The care management team was consulted for assistance with disease management and care coordination needs.    Engaged with patient by telephone for follow up visit in response to provider referral for case management and/or care coordination services.   Consent to Services:  The patient was given information about Chronic Care Management services, agreed to services, and gave verbal consent prior to initiation of services.  Please see initial visit note for detailed documentation.   Patient agreed to services and verbal consent obtained.   Assessment: Review of patient past medical history, allergies, medications, health status, including review of consultants reports, laboratory and other test data, was performed as part of comprehensive evaluation and provision of chronic care management services.   SDOH (Social Determinants of Health) assessments and interventions performed:    CCM Care Plan  Allergies  Allergen Reactions   Lodine [Etodolac] Anaphylaxis, Hives and Swelling   Oxycontin [Oxycodone Hcl] Anaphylaxis    hives, trouble breathing, tongue swelling (Only Oxycontin) Tolerates plain oxycodone.   Penicillins Anaphylaxis    Told by a surgeon never to take it again. Has patient had a PCN reaction causing immediate rash, facial/tongue/throat swelling, SOB or lightheadedness with hypotension: Yes Has patient had a PCN reaction causing severe rash involving mucus membranes or skin necrosis: Unknown Has patient had a PCN reaction that required hospitalization: No Has patient had a PCN reaction occurring within the last 10 years: No If all of the above answers are "NO", then may proceed with Cephalosporin use.   Aspirin Other (See  Comments)    High-dose caused GI Bleeds   Darvocet [Propoxyphene N-Acetaminophen] Hives   Nitroglycerin Other (See Comments)    IV-BP drops dramatically Can take po   Tramadol Hives and Itching   Ultram [Tramadol Hcl] Hives   Valium Other (See Comments)    Circulation problems. "Legs turned black".    Outpatient Encounter Medications as of 05/31/2022  Medication Sig   albuterol (PROVENTIL) (2.5 MG/3ML) 0.083% nebulizer solution Take 3 mLs (2.5 mg total) by nebulization every 6 (six) hours as needed for wheezing or shortness of breath (J45.40).   albuterol (VENTOLIN HFA) 108 (90 Base) MCG/ACT inhaler Inhale 2 puffs into the lungs every 6 (six) hours as needed for wheezing or shortness of breath.   aspirin EC 81 MG tablet Take 81 mg at bedtime by mouth.    atorvastatin (LIPITOR) 80 MG tablet Take 1 tablet (80 mg total) by mouth daily.   Blood Glucose Monitoring Suppl (ONE TOUCH ULTRA 2) w/Device KIT Use as directed   budesonide-formoterol (SYMBICORT) 80-4.5 MCG/ACT inhaler INHALE 2 PUFFS INTO THE LUNGS EVERY MORNING AND ANOTHER 2 PUFFS 12 HOURS LATER   budesonide-formoterol (SYMBICORT) 80-4.5 MCG/ACT inhaler Inhale 2 puffs into the lungs every morning and another 2 puffs 12 hours later.   buPROPion (WELLBUTRIN XL) 150 MG 24 hr tablet Take 1 tablet (150 mg total) by mouth daily.   Calcium Carbonate-Vitamin D 600-200 MG-UNIT TABS Take 1 tablet by mouth daily.   CINNAMON PO Take 1,000 mg 2 (two) times daily by mouth.   doxepin (SINEQUAN) 10 MG capsule Take 30 mg by mouth at bedtime.   empagliflozin (JARDIANCE) 25 MG TABS tablet Take 1 tablet (25 mg total) by mouth daily before  breakfast.   esomeprazole (NEXIUM) 40 MG capsule Take 1 capsule (40 mg total) by mouth 2 (two) times daily.   famotidine (PEPCID) 20 MG tablet Take 1 tablet (20 mg total) by mouth 2 (two) times daily as needed for heartburn or indigestion.   fluticasone (FLONASE) 50 MCG/ACT nasal spray Place 2 sprays at bedtime as needed  into both nostrils for allergies or rhinitis.    furosemide (LASIX) 20 MG tablet TAKE 1 OR 2 TABLETS BY MOUTH EVERY DAY AS DIRECTED   glipiZIDE (GLUCOTROL XL) 5 MG 24 hr tablet TAKE 1 TABLET BY MOUTH EVERYDAY WITH BREAKFAST.   hydrALAZINE (APRESOLINE) 25 MG tablet Take 1 tablet (25 mg total) by mouth daily as needed (for BP >140/90 mmHg, up to three times daily).   hydrocortisone 2.5 % cream Apply topically as needed.   isosorbide mononitrate (IMDUR) 60 MG 24 hr tablet Take 1 tablet (60 mg total) by mouth daily.   L-Methylfolate (DEPLIN) 7.5 MG TABS Take 7.5 mg by mouth daily with breakfast.   Lancets (ONETOUCH DELICA PLUS CHENID78E) MISC USE TO CHECK BLOOD SUGAR 2 TIMES A DAY   Lancets (ONETOUCH DELICA PLUS UMPNTI14E) MISC USE AS DIRECTED   levothyroxine (SYNTHROID) 75 MCG tablet Take 1 tablet (75 mcg total) by mouth daily before breakfast.   lisinopril (ZESTRIL) 5 MG tablet TAKE 1 TABLET (5 MG TOTAL) BY MOUTH DAILY.   LYRICA 150 MG capsule Take 150 mg by mouth 3 (three) times daily.    metFORMIN (GLUCOPHAGE) 500 MG tablet Take 1 tablet (500 mg total) by mouth 2 (two) times daily with a meal.   metoprolol succinate (TOPROL-XL) 25 MG 24 hr tablet Take 1 tablet (25 mg total) by mouth daily. TAKE WITH OR IMMEDIATELY FOLLOWING A MEAL.   Misc Natural Products (GLUCOS-CHONDROIT-MSM COMPLEX PO) Take 2 tablets by mouth daily.   mometasone (ELOCON) 0.1 % cream Apply topically.   montelukast (SINGULAIR) 10 MG tablet TAKE 1 TABLET BY MOUTH EVERYDAY AT BEDTIME   Multiple Vitamin (MULITIVITAMIN WITH MINERALS) TABS Take 1 tablet by mouth daily.   mupirocin cream (BACTROBAN) 2 % Apply once daily to lesions on feet as needed.   nitroGLYCERIN (NITROSTAT) 0.4 MG SL tablet Place 1 tablet (0.4 mg total) under the tongue every 5 (five) minutes as needed for chest pain.   ondansetron (ZOFRAN) 4 MG tablet Take 1 tablet (4 mg total) by mouth every 8 (eight) hours as needed for nausea or vomiting.   ONETOUCH ULTRA test  strip USE TO CHECK BLOOD SUGAR 2 TIMES A DAY   oxyCODONE (ROXICODONE) 15 MG immediate release tablet Take 15 mg by mouth 5 (five) times daily.   potassium chloride (KLOR-CON) 10 MEQ tablet Take 2 tablets (20 mEq total) by mouth daily.   promethazine (PHENERGAN) 25 MG tablet TAKE 1 TABLET BY MOUTH EVERYDAY AT BEDTIME   tiZANidine (ZANAFLEX) 4 MG tablet Take 4 mg by mouth every 8 (eight) hours as needed for muscle spasms.   vitamin B-12 (CYANOCOBALAMIN) 500 MCG tablet Take 500 mcg by mouth daily.    vitamin E 200 UNIT capsule Take 200 Units by mouth daily.   No facility-administered encounter medications on file as of 05/31/2022.    Patient Active Problem List   Diagnosis Date Noted   Spondylosis without myelopathy or radiculopathy, cervical region 04/21/2021   Neutropenia with fever ([place]) 10/01/2019   Genetic testing 09/17/2019   Family history of melanoma    Family history of pancreatic cancer    Malignant neoplasm  of upper-outer quadrant of left breast in female, estrogen receptor negative ([place]) 09/07/2019   Encounter for loop recorder at end of battery life 04/24/2018   Esophageal stricture 07/01/2017   Hyperlipidemia associated with type 2 diabetes mellitus ([place]) 05/20/2017   Allergic rhinitis 03/11/2017   Dilated cardiomyopathy ([place]) 02/07/2017   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 11/13/2016   Morbid obesity due to excess calories ([place]) 09/26/2016   Increased endometrial stripe thickness 06/27/2016   Intramural leiomyoma of uterus 06/27/2016   Type 2 diabetes mellitus without complication, without long-term current use of insulin ([place]) 12/06/2015   Hypertension associated with diabetes ([place]) 07/19/2015   Arrhythmia 07/19/2015   Orthostatic hypotension 07/19/2015   GERD (gastroesophageal reflux disease) 07/19/2015   Chronic back pain 07/19/2015   Neuropathy 07/19/2015   Symptomatic PVCs 11/02/2014   Syncope 11/02/2014   Chest pain 12/23/2013   Disorder of cervix  03/10/2013   Vaginal atrophy 03/10/2013   OSA (obstructive sleep apnea) 07/31/2012   Asthma 04/29/2012   Coronary artery disease 11/20/2011    Conditions to be addressed/monitored:CHF, CAD, HTN, and DMII  Care Plan : RN Care Manager Plan of Care  Updates made by Dimitri Ped, RN since 05/31/2022 12:00 AM  Completed 05/31/2022   Problem: Chronic Disease Management and Care Coordination Needs (DM2, CAD, CHF, Asthma,HTN, HLD, chronic pain) Resolved 05/31/2022  Priority: High     Long-Range Goal: Establish Plan of Care for Chronic Disease Management Needs (DM2, CAD, CHF, Asthma,HTN, HLD, chronic pain) Completed 05/31/2022  Start Date: 01/29/2022  Expected End Date: 01/29/2023  Priority: High  Note:   RNCM case closed goals met Current Barriers:  Knowledge Deficits related to plan of care for management of CHF, CAD, HTN, HLD, DMII, Asthma, and chronic back pain  Care Coordination needs related to Financial constraints related to food and Limited access to food Chronic Disease Management support and education needs related to CHF, CAD, HTN, HLD, DMII, Asthma, and chronic back pain   Financial Constraints  States that she sometimes has difficulty affording food and especially healthy food States she has been feeling good.  States her CBGs have been ranging from 97-130.   States she is trying to eat healthy and she does not use salt.  States she is walking for about 1 hour a day weather permitting.   States she is going to neuro rehab and it is helping some with her headaches  RNCM Clinical Goal(s):  Patient will verbalize understanding of plan for management of CHF, CAD, HTN, HLD, DMII, Asthma, and chronic back pain  as evidenced by voiced adherence to plan of care verbalize basic understanding of  CHF, CAD, HTN, HLD, DMII, Asthma, and chronic back pain disease process and self health management plan as evidenced by voiced understanding and teach back take all medications exactly as  prescribed and will call provider for medication related questions as evidenced by dispense report and pt verbalization  attend all scheduled medical appointments: Podiatry 06/13/22, Cardiology 03/20/23, Annual Wellness Visit 08/07/22,   as evidenced by medical records demonstrate Improved adherence to prescribed treatment plan for CHF, CAD, HTN, HLD, DMII, Asthma, and chronic back pain as evidenced by readings within limits, voiced adherence to plan of care continue to work with RN Care Manager to address care management and care coordination needs related to  CHF, CAD, HTN, HLD, DMII, Asthma, and chronic back pain as evidenced by adherence to CM Team Scheduled appointments work with pharmacist to address Financial constraints related  to food and Limited access to food related toCHF, CAD, HTN, HLD, DMII, Asthma, and chronic back pain  as evidenced by review or EMR and patient or pharmacist report work with community resource care guide to address needs related to  Financial constraints related to food and Limited access to food as evidenced by patient and/or community resource care guide support through collaboration with Consulting civil engineer, provider, and care team.   Interventions: 1:1 collaboration with primary care provider regarding development and update of comprehensive plan of care as evidenced by provider attestation and co-signature Inter-disciplinary care team collaboration (see longitudinal plan of care) Evaluation of current treatment plan related to  self management and patient's adherence to plan as established by provider   Asthma: (Status:Goal Met.) Long Term Goal Provided patient with basic written and verbal Asthma education on self care/management/and exacerbation prevention Advised patient to track and manage Asthma triggers Provided instruction about proper use of medications used for management of Asthma including inhalers Advised patient to self assesses Asthma action plan zone and  make appointment with provider if in the yellow zone for 48 hours without improvement Advised patient to engage in light exercise as tolerated 3-5 days a week to aid in the the management of Asthma   CAD Interventions: (Status:  Goal Met.) Long Term Goal Assessed understanding of CAD diagnosis Medications reviewed including medications utilized in CAD treatment plan Provided education on importance of blood pressure control in management of CAD Counseled on the importance of exercise goals with target of 150 minutes per week Reviewed Importance of taking all medications as prescribed Reviewed Importance of attending all scheduled provider appointments Advised to report any changes in symptoms or exercise tolerance   Heart Failure Interventions:  (Status:  Goal Met.) Long Term Goal Basic overview and discussion of pathophysiology of Heart Failure reviewed Provided education on low sodium diet Reviewed Heart Failure Action Plan in depth and provided written copy Discussed importance of daily weight and advised patient to weigh and record daily Reviewed role of diuretics in prevention of fluid overload and management of heart failure; Discussed the importance of keeping all appointments with provider Provided patient with education about the role of exercise in the management of heart failure Reinforced s/sx to call provider  Diabetes Interventions:  (Status:  Goal Met.) Long Term Goal Assessed patient's understanding of A1c goal: <7% Provided education to patient about basic DM disease process Reviewed medications with patient and discussed importance of medication adherence Provided patient with written educational materials related to hypo and hyperglycemia and importance of correct treatment Advised patient, providing education and rationale, to check cbg twice a day and record, calling provider for findings outside established parameters Referral made to pharmacy team for assistance  with cost, working with CCM PharmD for cost of Vania Rea  continues process for assistance Review of patient status, including review of consultants reports, relevant laboratory and other test results, and medications completed Reviewed importance of continuing to exercise daily to help lower her blood sugars. Reinforced fasting blood sugar goals of 80-130 and less than 180 1 1/2-2 hours after meals.  Lab Results  Component Value Date   HGBA1C 7.5 (H) 01/25/2022   Hyperlipidemia Interventions:  (Status:  Goal Met.) Long Term Goal Medication review performed; medication list updated in electronic medical record.  Counseled on importance of regular laboratory monitoring as prescribed Reviewed role and benefits of statin for ASCVD risk reduction Reviewed importance of limiting foods high in cholesterol Reviewed exercise goals and target  of 150 minutes per week  Hypertension Interventions:  (Status:  Goal Met.) Long Term Goal Last practice recorded BP readings:  BP Readings from Last 3 Encounters:  03/19/22 117/74  02/26/22 132/70  02/05/22 122/82  Most recent eGFR/CrCl: No results found for: EGFR  No components found for: CRCL  Evaluation of current treatment plan related to hypertension self management and patient's adherence to plan as established by provider Provided education to patient re: stroke prevention, s/s of heart attack and stroke Reviewed medications with patient and discussed importance of compliance Counseled on the importance of exercise goals with target of 150 minutes per week Discussed plans with patient for ongoing care management follow up and provided patient with direct contact information for care management team Advised patient, providing education and rationale, to monitor blood pressure daily and record, calling PCP for findings outside established parameters Provided education on prescribed diet low sodium low CHO heart healthy Discussed complications of poorly  controlled blood pressure such as heart disease, stroke, circulatory complications, vision complications, kidney impairment, sexual dysfunction  Pain Interventions:  (Status:  Goal Met.) Long Term Goal Pain assessment performed Medications reviewed Reviewed provider established plan for pain management Discussed importance of adherence to all scheduled medical appointments Counseled on the importance of reporting any/all new or changed pain symptoms or management strategies to pain management provider Advised patient to report to care team affect of pain on daily activities Discussed use of relaxation techniques and/or diversional activities to assist with pain reduction (distraction, imagery, relaxation, massage, acupressure, TENS, heat, and cold application Reviewed with patient prescribed pharmacological and nonpharmacological pain relief strategies Reviewed to keep appointment to  Neuro-rehab  Patient Goals/Self-Care Activities: Take all medications as prescribed Attend all scheduled provider appointments Call pharmacy for medication refills 3-7 days in advance of running out of medications Call provider office for new concerns or questions  call office if I gain more than 2 pounds in one day or 5 pounds in one week keep legs up while sitting watch for swelling in feet, ankles and legs every day weigh myself daily develop a rescue plan follow rescue plan if symptoms flare-up eat more whole grains, fruits and vegetables, lean meats and healthy fats keep appointment with eye doctor check blood sugar at prescribed times: twice daily and when you have symptoms of low or high blood sugar take the blood sugar log to all doctor visits fill half of plate with vegetables manage portion size wash and dry feet carefully every day check blood pressure daily choose a place to take my blood pressure (home, clinic or office, retail store) write blood pressure results in a log or diary take  blood pressure log to all doctor appointments limit salt intake to 235m/day call for medicine refill 2 or 3 days before it runs out take all medications exactly as prescribed call doctor with any symptoms you believe are related to your medicine  Follow Up Plan:  The patient has been provided with contact information for the care management team and has been advised to call with any health related questions or concerns.  No further follow up required: RNCM case closed goals met       Plan:The patient has been provided with contact information for the care management team and has been advised to call with any health related questions or concerns.  No further follow up required: RNCM case closed goals met MPeter GarterRN, BAsh[facility] CDE Care Management Coordinator [facility] ([phone]

## 2022-05-31 NOTE — Patient Instructions (Signed)
Visit Information RNCM case closed goals met Thank you for allowing me to share the care management and care coordination services that are available to you as part of your health plan and services through your primary care provider and medical home. Please reach out to me at [phone] if the care management/care coordination team may be of assistance to you in the future.   Peter Garter RN, Jackquline Denmark, CDE Care Management Coordinator [facility] [phone]

## 2022-06-01 ENCOUNTER — Telehealth: Payer: Self-pay | Admitting: Pharmacist

## 2022-06-01 DIAGNOSIS — G894 Chronic pain syndrome: Secondary | ICD-10-CM | POA: Diagnosis not present

## 2022-06-01 DIAGNOSIS — Z79891 Long term (current) use of opiate analgesic: Secondary | ICD-10-CM | POA: Diagnosis not present

## 2022-06-01 NOTE — Telephone Encounter (Signed)
Called patient as [facility] nurse reached out for assistance with the Jardiance. Patient had questions about the application for patient assistance and wanted a call back. Unable to reach patient and left a voicemail. Requested a call back.

## 2022-06-01 NOTE — Telephone Encounter (Signed)
Called patient again to follow up on Medicare extra help denial. Medicare representative was unable to provide information over the phone about her application and is only able to speak with the patient. Left a detailed voicemail letting the patient know and provided a telephone number of [phone] for the patient to call and request a new denial letter to be mailed to her. Requested a call back.

## 2022-06-01 NOTE — Telephone Encounter (Signed)
Patient called back. Patient has called Medicare 3 times and was told she didn't quality for extra help but they have not provided her with written documentation. Set aside samples of Jardiance for her to pick up while waiting for documented rejection from medicare extra help. Will attempt to call Medicare to follow up on application status.

## 2022-06-06 ENCOUNTER — Other Ambulatory Visit (HOSPITAL_BASED_OUTPATIENT_CLINIC_OR_DEPARTMENT_OTHER): Payer: Self-pay

## 2022-06-06 ENCOUNTER — Encounter: Payer: Self-pay | Admitting: Hematology and Oncology

## 2022-06-06 MED ORDER — OXYCODONE HCL 15 MG PO TABS
ORAL_TABLET | ORAL | 0 refills | Status: DC
  Filled 2022-06-06: qty 120, 30d supply, fill #0

## 2022-06-07 ENCOUNTER — Telehealth: Payer: Self-pay | Admitting: Pharmacist

## 2022-06-07 NOTE — Chronic Care Management (AMB) (Signed)
Chronic Care Management Pharmacy Assistant   Name: Shelby Anderson  MRN: [id number] DOB: July 14, 1956  Reason for Encounter: Disease State / Diabetes Assessment Call   Conditions to be addressed/monitored: DMII  Recent office visits:  05/11/2022 - Alysia Penna MD - Patient was seen for yeast vaginitis and an additional issue. Started Fluconazole 150 mg daily. No follow up noted.   Recent consult visits:  05/17/2022 Rolm Bookbinder MD (general surgery) - Patient was seen for Malignant neoplasm of upper-outer quadrant of left breast in female, estrogen receptor negative. No medication changes. Follow up in 1 year.  Hospital visits:  None  Medications: Outpatient Encounter Medications as of 06/07/2022  Medication Sig   albuterol (PROVENTIL) (2.5 MG/3ML) 0.083% nebulizer solution Take 3 mLs (2.5 mg total) by nebulization every 6 (six) hours as needed for wheezing or shortness of breath (J45.40).   albuterol (VENTOLIN HFA) 108 (90 Base) MCG/ACT inhaler TAKE 2 PUFFS BY MOUTH EVERY 6 HOURS AS NEEDED FOR WHEEZE OR SHORTNESS OF BREATH   aspirin EC 81 MG tablet Take 81 mg at bedtime by mouth.    atorvastatin (LIPITOR) 80 MG tablet Take 1 tablet (80 mg total) by mouth daily.   Blood Glucose Monitoring Suppl (ONE TOUCH ULTRA 2) w/Device KIT Use as directed   budesonide-formoterol (SYMBICORT) 80-4.5 MCG/ACT inhaler INHALE 2 PUFFS INTO THE LUNGS EVERY MORNING AND ANOTHER 2 PUFFS 12 HOURS LATER   budesonide-formoterol (SYMBICORT) 80-4.5 MCG/ACT inhaler Inhale 2 puffs into the lungs every morning and another 2 puffs 12 hours later.   buPROPion (WELLBUTRIN XL) 150 MG 24 hr tablet Take 1 tablet (150 mg total) by mouth daily.   Calcium Carbonate-Vitamin D 600-200 MG-UNIT TABS Take 1 tablet by mouth daily.   CINNAMON PO Take 1,000 mg 2 (two) times daily by mouth.   doxepin (SINEQUAN) 10 MG capsule Take 30 mg by mouth at bedtime.   empagliflozin (JARDIANCE) 25 MG TABS tablet Take 1 tablet (25 mg  total) by mouth daily before breakfast.   esomeprazole (NEXIUM) 40 MG capsule Take 1 capsule (40 mg total) by mouth 2 (two) times daily.   famotidine (PEPCID) 20 MG tablet Take 1 tablet (20 mg total) by mouth 2 (two) times daily as needed for heartburn or indigestion.   fluticasone (FLONASE) 50 MCG/ACT nasal spray Place 2 sprays at bedtime as needed into both nostrils for allergies or rhinitis.    furosemide (LASIX) 20 MG tablet TAKE 1 OR 2 TABLETS BY MOUTH EVERY DAY AS DIRECTED   glipiZIDE (GLUCOTROL XL) 5 MG 24 hr tablet TAKE 1 TABLET BY MOUTH EVERYDAY WITH BREAKFAST.   hydrALAZINE (APRESOLINE) 25 MG tablet Take 1 tablet (25 mg total) by mouth daily as needed (for BP >140/90 mmHg, up to three times daily).   hydrocortisone 2.5 % cream Apply topically as needed.   isosorbide mononitrate (IMDUR) 60 MG 24 hr tablet Take 1 tablet (60 mg total) by mouth daily.   L-Methylfolate (DEPLIN) 7.5 MG TABS Take 7.5 mg by mouth daily with breakfast.   Lancets (ONETOUCH DELICA PLUS [id number]) MISC USE TO CHECK BLOOD SUGAR 2 TIMES A DAY   Lancets (ONETOUCH DELICA PLUS [id number]) MISC USE AS DIRECTED   levothyroxine (SYNTHROID) 75 MCG tablet Take 1 tablet (75 mcg total) by mouth daily before breakfast.   lisinopril (ZESTRIL) 5 MG tablet TAKE 1 TABLET (5 MG TOTAL) BY MOUTH DAILY.   LYRICA 150 MG capsule Take 150 mg by mouth 3 (three) times daily.    metFORMIN (  GLUCOPHAGE) 500 MG tablet Take 1 tablet (500 mg total) by mouth 2 (two) times daily with a meal.   metoprolol succinate (TOPROL-XL) 25 MG 24 hr tablet Take 1 tablet (25 mg total) by mouth daily. TAKE WITH OR IMMEDIATELY FOLLOWING A MEAL.   Misc Natural Products (GLUCOS-CHONDROIT-MSM COMPLEX PO) Take 2 tablets by mouth daily.   mometasone (ELOCON) 0.1 % cream Apply topically.   montelukast (SINGULAIR) 10 MG tablet TAKE 1 TABLET BY MOUTH EVERYDAY AT BEDTIME   Multiple Vitamin (MULITIVITAMIN WITH MINERALS) TABS Take 1 tablet by mouth daily.   mupirocin cream  (BACTROBAN) 2 % Apply once daily to lesions on feet as needed.   nitroGLYCERIN (NITROSTAT) 0.4 MG SL tablet Place 1 tablet (0.4 mg total) under the tongue every 5 (five) minutes as needed for chest pain.   ondansetron (ZOFRAN) 4 MG tablet Take 1 tablet (4 mg total) by mouth every 8 (eight) hours as needed for nausea or vomiting.   ONETOUCH ULTRA test strip USE TO CHECK BLOOD SUGAR 2 TIMES A DAY   oxyCODONE (ROXICODONE) 15 MG immediate release tablet Take 15 mg by mouth 5 (five) times daily.   oxyCODONE (ROXICODONE) 15 MG immediate release tablet Take 1 tablet by mouth four times a day   potassium chloride (KLOR-CON) 10 MEQ tablet Take 2 tablets (20 mEq total) by mouth daily.   promethazine (PHENERGAN) 25 MG tablet TAKE 1 TABLET BY MOUTH EVERYDAY AT BEDTIME   tiZANidine (ZANAFLEX) 4 MG tablet Take 4 mg by mouth every 8 (eight) hours as needed for muscle spasms.   vitamin B-12 (CYANOCOBALAMIN) 500 MCG tablet Take 500 mcg by mouth daily.    vitamin E 200 UNIT capsule Take 200 Units by mouth daily.   No facility-administered encounter medications on file as of 06/07/2022.  Fill History: atorvastatin 80 mg tablet 05/17/2022 56   BUPROPION HCL XL 150 MG TABLET 04/18/2022 90   doxepin 10 mg capsule 05/08/2022 90   JARDIANCE 25 MG TABLET 02/26/2022 90   NEXIUM DR 40 MG CAPSULE 06/01/2022 30   FAMOTIDINE 20 MG TABLET 02/24/2022 90   FUROSEMIDE 20 MG TABLET 01/24/2022 90   glipizide ER 5 mg tablet, extended release 24 hr 05/17/2022 53   isosorbide mononitrate ER 60 mg tablet,extended release 24 hr 05/14/2022 65   levomefolate calcium 7.5 mg tablet 05/17/2022 59   LEVOTHYROXINE 75 MCG TABLET 01/31/2022 90   LISINOPRIL 5 MG TABLET 05/01/2022 90   metformin 500 mg tablet 05/11/2022 68   metoprolol succinate ER 25 mg tablet,extended release 24 hr 05/17/2022 59   MOMETASONE FUROATE 0.1% CREAM 03/26/2022 30   MONTELUKAST SOD 10 MG TABLET 04/18/2022 90   NITROGLYCERIN 0.4 MG TABLET SL  07/28/2021 5   oxyCODONE (ROXICODONE) 15 MG immediate release tablet 06/06/2022 30   POTASSIUM CL ER 10 MEQ TABLET 03/06/2022 90   pregabalin 150 mg capsule 05/31/2022 30   PROMETHAZINE 25 MG TABLET 04/03/2022 90   tizanidine 4 mg tablet 05/31/2022 47   Recent Relevant Labs: Lab Results  Component Value Date/Time   HGBA1C 7.5 (H) 01/25/2022 01:11 PM   HGBA1C 7.4 (H) 02/27/2021 01:00 PM   HGBA1C 5.9 01/12/2017 12:00 AM   MICROALBUR 7.7 (H) 01/25/2022 01:11 PM   MICROALBUR 1.1 02/27/2021 01:00 PM    Kidney Function Lab Results  Component Value Date/Time   CREATININE 0.79 01/25/2022 01:11 PM   CREATININE 0.65 02/27/2021 01:00 PM   CREATININE 0.67 09/30/2020 03:33 PM   CREATININE 0.69 11/05/2019 11:00 AM  CREATININE 0.73 10/15/2019 11:00 AM   GFR 79.36 01/25/2022 01:11 PM   GFRNONAA >60 12/08/2019 10:25 AM   GFRNONAA >60 11/05/2019 11:00 AM   GFRAA >60 12/08/2019 10:25 AM   GFRAA >60 11/05/2019 11:00 AM    Current antihyperglycemic regimen:  Metformin 500 mg 1 tablet twice daily Glipizide 5 mg 1 tablet with breakfast  Jardiance 25 mg 1 tablet daily   What recent interventions/DTPs have been made to improve glycemic control: No recent interventions.  Have there been any recent hospitalizations or ED visits since last visit with CPP? No recent hospital visits  Patient hypoglycemic symptoms, including   Patient  hyperglycemic symptoms, including   How often are you checking your blood sugar?   What are your blood sugars ranging?  Fasting:  Before meals:  After meals:  Bedtime:   During the week, how often does your blood glucose drop below 70?   Are you checking your feet daily/regularly?   Adherence Review: Is the patient currently on a STATIN medication? Yes Is the patient currently on ACE/ARB medication? Yes Does the patient have >5 day gap between last estimated fill dates? No  Unable to reach patient after several attempts  Care Gaps: AWV -  scheduled 08/07/2022 Last BP - 108/64 on 05/11/2022 Last A1C - 7.5 on 01/25/2022 Shingrix - never done Covid booster - overdue Papsmear - postponed  Star Rating Drugs: Atorvastatin 80 mg  - last filled 05/17/2022 56 DS at [facility] Jardiance 25 mg - last filled 02/26/2022 90 [facility] at [facility] verified with [facility] Glipizide 5 mg - last filled 05/17/2022 53 DS at [facility] Lisinopril 5 mg - last filled 05/01/2022 90 DS at [facility] Metformin 500 mg - last filled 05/11/2022 68 DS at [facility] [phone]

## 2022-06-11 ENCOUNTER — Telehealth: Payer: Medicare Other

## 2022-06-12 ENCOUNTER — Encounter: Payer: Self-pay | Admitting: Adult Health

## 2022-06-12 ENCOUNTER — Ambulatory Visit (INDEPENDENT_AMBULATORY_CARE_PROVIDER_SITE_OTHER): Payer: Medicare Other

## 2022-06-12 ENCOUNTER — Ambulatory Visit: Payer: Medicare Other | Admitting: Adult Health

## 2022-06-12 VITALS — BP 100/60 | HR 74 | Temp 97.9°F | Ht 66.0 in | Wt 216.6 lb

## 2022-06-12 DIAGNOSIS — G4733 Obstructive sleep apnea (adult) (pediatric): Secondary | ICD-10-CM

## 2022-06-12 DIAGNOSIS — K029 Dental caries, unspecified: Secondary | ICD-10-CM | POA: Insufficient documentation

## 2022-06-12 DIAGNOSIS — J4530 Mild persistent asthma, uncomplicated: Secondary | ICD-10-CM

## 2022-06-12 DIAGNOSIS — J45909 Unspecified asthma, uncomplicated: Secondary | ICD-10-CM | POA: Diagnosis not present

## 2022-06-12 NOTE — Progress Notes (Signed)
'@Patient'  ID: Shelby Anderson, female    DOB: Feb 08, 1956, 66 y.o.   MRN: [id number]  Chief Complaint  Patient presents with   Follow-up    Referring provider: No ref. 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  She underwent lumpectomy and January 2021.  And completed her course of radiation. 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  TEST/EVENTS :  Spirometry 12/2011 - no airway obstruction -No reversibility with bronchodilator , mod restriction   Spirometry 07/2018 FVC 1.64 (46%), FEV1 1.34 (49%), ratio 82 (104%)    07/2018 FENO 11   Home sleep test 07/2012 - showed desatn,no OSA   11/2015 ONO positive , Started on oxygen 2l/m .   10/2016 CT angio neg PE   CT sinus 01/2017 >> clear   12/2013 -admitted  asthma exacerbation,CAP, RSV bronchitis.  EGD 03/2011 ([facility]) - peptic ulcer disease.  12/2013  barium esophagram - distal esophageal stricture, stricture dilated again 4098,1191 ([facility]) 01/2017  Swallow >> Persistent/recurrent stricture at the GE junction    HST 08/2018 AHI 9/h 09/2018 >> CPAP 10 cm, med FF mask  06/12/2022 Follow up : Asthma and OSA  Patient presents for a 65-monthfollow-up.  Patient is underlying asthma.  She remains on Symbicort and Singulair.  She says overall breathing is doing okay.  She denies any increased albuterol use.  Did notice a few weeks ago with poor air quality she had 1 day where she did not breathe as well.  But seems to have resolved pretty quickly.  She says her activity tolerance is at baseline.  She  gets pretty winded if she does heavy activities.  She does try to stay active.  Walks occasionally.  Has chronic allergies takes Nasacort on occasion.  Says overall they are doing well without much flare.  Patient has underlying sleep apnea.  Patient is on nocturnal CPAP.  Says she tries to wear it each night.  Some nights only gets in a few hours.  If she is not sleeping well.  CPAP download shows 93% compliance.  Daily average usage at 5.5 hours.  Patient is on CPAP 10 cm H2O.  AHI 0.6. Patient says she does feel better if she wears her CPAP with decreased daytime sleepiness  Has several dental issues but can not afford to have teeth pulled . We discussed looking into 1 tooth at at time to see if this would be an option.   Lives at home with husband. Has custody of grandkids age 6586and 19 Stays active.     Allergies  Allergen Reactions   Lodine [Etodolac] Anaphylaxis, Hives and Swelling   Oxycontin [Oxycodone Hcl] Anaphylaxis    hives, trouble breathing, tongue swelling (Only Oxycontin) Tolerates plain oxycodone.   Penicillins Anaphylaxis    Told by a surgeon never to take it again. Has patient had a PCN reaction causing immediate rash, facial/tongue/throat swelling, SOB or lightheadedness with hypotension: Yes Has patient had a PCN reaction causing severe rash involving mucus membranes or skin necrosis: Unknown Has patient had a PCN reaction that required hospitalization: No Has patient had a PCN reaction occurring within the last  10 years: No If all of the above answers are "NO", then may proceed with Cephalosporin use.   Aspirin Other (See Comments)    High-dose caused GI Bleeds   Darvocet [Propoxyphene N-Acetaminophen] Hives   Nitroglycerin Other (See Comments)    IV-BP drops dramatically Can take po   Tramadol Hives and Itching   Ultram [Tramadol Hcl] Hives   Valium Other (See Comments)    Circulation problems. "Legs turned black".    Immunization History  Administered  Date(s) Administered   Influenza Split 08/19/2011   Influenza Whole 01/03/2013   Influenza,inj,Quad PF,6+ Mos 08/20/2013, 09/15/2014, 08/26/2015, 08/01/2016, 08/22/2017, 10/15/2018, 09/24/2019, 12/07/2020, 02/05/2022   Influenza-Unspecified 08/03/2016   Moderna Sars-Covid-2 Vaccination 03/11/2020, 04/08/2020, 08/03/2020   PNEUMOCOCCAL CONJUGATE-20 02/05/2022   Pneumococcal Conjugate-13 02/24/2021   Pneumococcal Polysaccharide-23 08/19/2011, 12/12/2017   Tdap 06/28/2014    Past Medical History:  Diagnosis Date   Allergy    Anemia    "chronic"   Angina    Prinzmetal angina   Anxiety and depression    Arthritis    Asthma    Atrial fibrillation ([place])    h/o "AF w/frequent PVCs"   Breast cancer ([place])    Left   Cancer ([place])    hx of skin cancer    CHF (congestive heart failure) (H[facility])    Chronic back pain greater than 3 months duration    on chronic narcotics, treated at pain clinic   Chronic respiratory failure ([place]) 09/15/2015   ONO 09/04/15 + desats >begin O2 at 2l/ m    Colon polyps    hyperplastic   Coronary artery disease    Arrythmia, orthostatic hypotension, HLD, HTN; sees Dr. Einar Gip   Difficult intubation    "TMJ & woke up when they were still cutting on me"   Dysrhythmia    sees Dr. Einar Gip and a cardiologist at [facility]   Esophageal stricture    Family history of melanoma ]   Family history of pancreatic cancer    Fatty liver    Fatty liver    Fibroids    Fibromyalgia    "in my legs"   GERD (gastroesophageal reflux disease)    hx hiatal hernia, stricture and gastric ulcer   Headache(784.0)    migraines   Heart murmur    Hiatal hernia    History of loop recorder    History of migraines    "dx'd when I was in my teens"   Hyperlipemia    Hypertension    Mental disorder    Mild episode of recurrent major depressive disorder ([place]) 12/06/2015   Myocardial infarction [facility]) 1980's & 1990;   sees Dr. Einar Gip   OSA (obstructive sleep apnea)    Personal history of  chemotherapy    Personal history of radiation therapy    Pneumonia    multiple times   PONV (postoperative nausea and vomiting)    Recurrent upper respiratory infection (URI)    S/P left TKA 09/25/2016   Shortness of breath 11/20/11   "all the time", sees pulmonlogy, ? asthma   Stenotic cervical os    Stomach ulcer    "3 small; found in 05/2011"   TMJ (dislocation of temporomandibular joint)    Tuberculosis    + TB SKIN TEST   Type 2 diabetes mellitus without complication, without long-term current use of insulin ([place]) 12/06/2015   not on meds     Tobacco History: Social History   Tobacco Use  Smoking Status Never  Smokeless  Tobacco Never   Counseling given: Not Answered   Outpatient Medications Prior to Visit  Medication Sig Dispense Refill   albuterol (PROVENTIL) (2.5 MG/3ML) 0.083% nebulizer solution Take 3 mLs (2.5 mg total) by nebulization every 6 (six) hours as needed for wheezing or shortness of breath (J45.40). 75 mL 3   albuterol (VENTOLIN HFA) 108 (90 Base) MCG/ACT inhaler TAKE 2 PUFFS BY MOUTH EVERY 6 HOURS AS NEEDED FOR WHEEZE OR SHORTNESS OF BREATH 20.1 each 3   aspirin EC 81 MG tablet Take 81 mg at bedtime by mouth.      atorvastatin (LIPITOR) 80 MG tablet Take 1 tablet (80 mg total) by mouth daily. 90 tablet 0   Blood Glucose Monitoring Suppl (ONE TOUCH ULTRA 2) w/Device KIT Use as directed 1 kit 1   budesonide-formoterol (SYMBICORT) 80-4.5 MCG/ACT inhaler INHALE 2 PUFFS INTO THE LUNGS EVERY MORNING AND ANOTHER 2 PUFFS 12 HOURS LATER 10.2 g 3   budesonide-formoterol (SYMBICORT) 80-4.5 MCG/ACT inhaler Inhale 2 puffs into the lungs every morning and another 2 puffs 12 hours later. 3 each 3   buPROPion (WELLBUTRIN XL) 150 MG 24 hr tablet Take 1 tablet (150 mg total) by mouth daily. 90 tablet 1   Calcium Carbonate-Vitamin D 600-200 MG-UNIT TABS Take 1 tablet by mouth daily.     CINNAMON PO Take 1,000 mg 2 (two) times daily by mouth.     doxepin (SINEQUAN) 10 MG  capsule Take 30 mg by mouth at bedtime.     empagliflozin (JARDIANCE) 25 MG TABS tablet Take 1 tablet (25 mg total) by mouth daily before breakfast. 90 tablet 1   esomeprazole (NEXIUM) 40 MG capsule Take 1 capsule (40 mg total) by mouth 2 (two) times daily. 180 capsule 0   famotidine (PEPCID) 20 MG tablet Take 1 tablet (20 mg total) by mouth 2 (two) times daily as needed for heartburn or indigestion. 180 tablet 1   fluticasone (FLONASE) 50 MCG/ACT nasal spray Place 2 sprays at bedtime as needed into both nostrils for allergies or rhinitis.      furosemide (LASIX) 20 MG tablet TAKE 1 OR 2 TABLETS BY MOUTH EVERY DAY AS DIRECTED 180 tablet 1   glipiZIDE (GLUCOTROL XL) 5 MG 24 hr tablet TAKE 1 TABLET BY MOUTH EVERYDAY WITH BREAKFAST. 90 tablet 0   hydrALAZINE (APRESOLINE) 25 MG tablet Take 1 tablet (25 mg total) by mouth daily as needed (for BP >140/90 mmHg, up to three times daily). 30 tablet 3   hydrocortisone 2.5 % cream Apply topically as needed.     isosorbide mononitrate (IMDUR) 60 MG 24 hr tablet Take 1 tablet (60 mg total) by mouth daily. 90 tablet 0   L-Methylfolate (DEPLIN) 7.5 MG TABS Take 7.5 mg by mouth daily with breakfast.     Lancets (ONETOUCH DELICA PLUS [id number]) MISC USE TO CHECK BLOOD SUGAR 2 TIMES A DAY 100 each 1   Lancets (ONETOUCH DELICA PLUS [id number]) MISC USE AS DIRECTED 100 each 1   levothyroxine (SYNTHROID) 75 MCG tablet Take 1 tablet (75 mcg total) by mouth daily before breakfast. 90 tablet 0   lisinopril (ZESTRIL) 5 MG tablet TAKE 1 TABLET (5 MG TOTAL) BY MOUTH DAILY. 90 tablet 0   LYRICA 150 MG capsule Take 150 mg by mouth 3 (three) times daily.   2   metFORMIN (GLUCOPHAGE) 500 MG tablet Take 1 tablet (500 mg total) by mouth 2 (two) times daily with a meal. 180 tablet 0   metoprolol succinate (TOPROL-XL) 25 MG  24 hr tablet Take 1 tablet (25 mg total) by mouth daily. TAKE WITH OR IMMEDIATELY FOLLOWING A MEAL. 90 tablet 3   Misc Natural Products (GLUCOS-CHONDROIT-MSM  COMPLEX PO) Take 2 tablets by mouth daily.     mometasone (ELOCON) 0.1 % cream Apply topically.     montelukast (SINGULAIR) 10 MG tablet TAKE 1 TABLET BY MOUTH EVERYDAY AT BEDTIME 90 tablet 0   Multiple Vitamin (MULITIVITAMIN WITH MINERALS) TABS Take 1 tablet by mouth daily.     mupirocin cream (BACTROBAN) 2 % Apply once daily to lesions on feet as needed. 30 g 1   nitroGLYCERIN (NITROSTAT) 0.4 MG SL tablet Place 1 tablet (0.4 mg total) under the tongue every 5 (five) minutes as needed for chest pain. 10 tablet 2   ondansetron (ZOFRAN) 4 MG tablet Take 1 tablet (4 mg total) by mouth every 8 (eight) hours as needed for nausea or vomiting. 80 tablet 2   ONETOUCH ULTRA test strip USE TO CHECK BLOOD SUGAR 2 TIMES A DAY 100 strip 3   oxyCODONE (ROXICODONE) 15 MG immediate release tablet Take 15 mg by mouth 5 (five) times daily.     oxyCODONE (ROXICODONE) 15 MG immediate release tablet Take 1 tablet by mouth four times a day 120 tablet 0   potassium chloride (KLOR-CON) 10 MEQ tablet Take 2 tablets (20 mEq total) by mouth daily. 180 tablet 0   promethazine (PHENERGAN) 25 MG tablet TAKE 1 TABLET BY MOUTH EVERYDAY AT BEDTIME 90 tablet 1   tiZANidine (ZANAFLEX) 4 MG tablet Take 4 mg by mouth every 8 (eight) hours as needed for muscle spasms.     vitamin B-12 (CYANOCOBALAMIN) 500 MCG tablet Take 500 mcg by mouth daily.      vitamin E 200 UNIT capsule Take 200 Units by mouth daily.     No facility-administered medications prior to visit.     Review of Systems:   Constitutional:   No  weight loss, night sweats,  Fevers, chills,  +fatigue, or  lassitude.  HEENT:   No headaches,  Difficulty swallowing,  Tooth/dental problems, or  Sore throat,                No sneezing, itching, ear ache, nasal congestion, post nasal drip,   CV:  No chest pain,  Orthopnea, PND, swelling in lower extremities, anasarca, dizziness, palpitations, syncope.   GI  No heartburn, indigestion, abdominal pain, nausea, vomiting,  diarrhea, change in bowel habits, loss of appetite, bloody stools.   Resp: ,  No non-productive cough,  No coughing up of blood.  No change in color of mucus.  No wheezing.  No chest wall deformity  Skin: no rash or lesions.  GU: no dysuria, change in color of urine, no urgency or frequency.  No flank pain, no hematuria   MS:  No joint pain or swelling.  No decreased range of motion.  No back pain.    Physical Exam  BP 100/60 (BP Location: Right Arm, Patient Position: Sitting, Cuff Size: Large)   Pulse 74   Temp 97.9 F (36.6 C) (Oral)   Ht '5\' 6"'  (1.676 m)   Wt 216 lb 9.6 oz (98.2 kg)   SpO2 92%   BMI 34.96 kg/m   GEN: A/Ox3; pleasant , NAD, well nourished    HEENT:  [place]/AT,  NOSE-clear, THROAT-clear, no lesions, no postnasal drip or exudate noted.  Class III MP airway, poor dentition  NECK:  Supple w/ fair ROM; no JVD; normal carotid impulses  w/o bruits; no thyromegaly or nodules palpated; no lymphadenopathy.    RESP  Clear  P & A; w/o, wheezes/ rales/ or rhonchi. no accessory muscle use, no dullness to percussion  CARD:  RRR, no m/r/g, no peripheral edema, pulses intact, no cyanosis or clubbing.  GI:   Soft & nt; nml bowel sounds; no organomegaly or masses detected.   Musco: Warm bil, no deformities or joint swelling noted.   Neuro: alert, no focal deficits noted.    Skin: Warm, no lesions or rashes    Lab Results:  CBC   BNP   Imaging: No results found.        No data to display          Lab Results  Component Value Date   NITRICOXIDE 8 11/13/2016        Assessment & Plan:   OSA (obstructive sleep apnea) Patient is encouraged on CPAP compliance.  Plan  Patient Instructions  Continue on Symbicort 2 puffs Twice daily  , rinse after use.  Continue on Singulair daily  Albuterol As needed   Chest xray today .   Continue on CPAP At bedtime   Wear all night long for at least 6hr each day.  Work on [employer] and weight loss.  Do  not drive if sleepy   Follow up with dentist   Follow up with Dr. Elsworth Soho  In 6 months and As needed   Please contact office for sooner follow up if symptoms do not improve or worsen or seek emergency care        Asthma Well-controlled on Symbicort and Singulair Check chest x-ray today.  Plan  Patient Instructions  Continue on Symbicort 2 puffs Twice daily  , rinse after use.  Continue on Singulair daily  Albuterol As needed   Chest xray today .   Continue on CPAP At bedtime   Wear all night long for at least 6hr each day.  Work on [employer] and weight loss.  Do not drive if sleepy   Follow up with dentist   Follow up with Dr. Elsworth Soho  In 6 months and As needed   Please contact office for sooner follow up if symptoms do not improve or worsen or seek emergency care        Dental caries Multiple broken teeth and dental caries.  Have discussed following up with a dentist for removal if possible.  Have advised her to look into different options for removal to help with financial burden.     Rexene Edison, NP 06/12/2022

## 2022-06-12 NOTE — Patient Instructions (Addendum)
Continue on Symbicort 2 puffs Twice daily  , rinse after use.  Continue on Singulair daily  Albuterol As needed   Chest xray today .   Continue on CPAP At bedtime   Wear all night long for at least 6hr each day.  Work on [employer] and weight loss.  Do not drive if sleepy   Follow up with dentist   Follow up with Dr. Elsworth Soho  In 6 months and As needed   Please contact office for sooner follow up if symptoms do not improve or worsen or seek emergency care

## 2022-06-12 NOTE — Assessment & Plan Note (Signed)
Multiple broken teeth and dental caries.  Have discussed following up with a dentist for removal if possible.  Have advised her to look into different options for removal to help with financial burden.

## 2022-06-12 NOTE — Assessment & Plan Note (Signed)
Patient is encouraged on CPAP compliance.  Plan  Patient Instructions  Continue on Symbicort 2 puffs Twice daily  , rinse after use.  Continue on Singulair daily  Albuterol As needed   Chest xray today .   Continue on CPAP At bedtime   Wear all night long for at least 6hr each day.  Work on [employer] and weight loss.  Do not drive if sleepy   Follow up with dentist   Follow up with Dr. Elsworth Soho  In 6 months and As needed   Please contact office for sooner follow up if symptoms do not improve or worsen or seek emergency care

## 2022-06-12 NOTE — Assessment & Plan Note (Addendum)
Well-controlled on Symbicort and Singulair Check chest x-ray today.  Plan  Patient Instructions  Continue on Symbicort 2 puffs Twice daily  , rinse after use.  Continue on Singulair daily  Albuterol As needed   Chest xray today .   Continue on CPAP At bedtime   Wear all night long for at least 6hr each day.  Work on [employer] and weight loss.  Do not drive if sleepy   Follow up with dentist   Follow up with Dr. Elsworth Soho  In 6 months and As needed   Please contact office for sooner follow up if symptoms do not improve or worsen or seek emergency care

## 2022-06-13 ENCOUNTER — Encounter: Payer: Self-pay | Admitting: Podiatry

## 2022-06-13 ENCOUNTER — Other Ambulatory Visit: Payer: Self-pay | Admitting: *Deleted

## 2022-06-13 ENCOUNTER — Ambulatory Visit: Payer: Medicare Other | Admitting: Podiatry

## 2022-06-13 DIAGNOSIS — M79675 Pain in left toe(s): Secondary | ICD-10-CM | POA: Diagnosis not present

## 2022-06-13 DIAGNOSIS — L84 Corns and callosities: Secondary | ICD-10-CM | POA: Diagnosis not present

## 2022-06-13 DIAGNOSIS — M79674 Pain in right toe(s): Secondary | ICD-10-CM | POA: Diagnosis not present

## 2022-06-13 DIAGNOSIS — B351 Tinea unguium: Secondary | ICD-10-CM

## 2022-06-13 DIAGNOSIS — E1151 Type 2 diabetes mellitus with diabetic peripheral angiopathy without gangrene: Secondary | ICD-10-CM | POA: Diagnosis not present

## 2022-06-13 MED ORDER — EMPAGLIFLOZIN 25 MG PO TABS: 25.0000 mg | ORAL_TABLET | Freq: Every day | ORAL | 0 refills | Status: DC

## 2022-06-13 NOTE — Addendum Note (Signed)
Addended by: Vanessa Barbara on: 06/13/2022 04:30 PM   Modules accepted: Orders

## 2022-06-18 NOTE — Progress Notes (Signed)
Subjective:  Patient ID: Shelby Anderson, female    DOB: 1956-01-06,  MRN: [id number]  Shelby Anderson presents to clinic today for at risk foot care. Pt has h/o NIDDM with PAD and callus(es) b/l lower extremities and painful thick toenails that are difficult to trim. Painful toenails interfere with ambulation. Aggravating factors include wearing enclosed shoe gear. Pain is relieved with periodic professional debridement. Painful calluses are aggravated when weightbearing with and without shoegear. Pain is relieved with periodic professional debridement.  Patient states blood glucose was 110 mg/dl today.  Last A1c was 7.6%.  New problem(s): None.   PCP is Koberlein, Steele Berg, MD (Inactive) , and last visit was two weeks ago.  Allergies  Allergen Reactions   Lodine [Etodolac] Anaphylaxis, Hives and Swelling   Oxycodone Anaphylaxis    Other reaction(s): Other (See Comments) UNKNOWN  hives, trouble breathing, tongue swelling (Only Oxycontin) Tolerates plain oxycodone.   Oxycontin [Oxycodone Hcl] Anaphylaxis    hives, trouble breathing, tongue swelling (Only Oxycontin) Tolerates plain oxycodone.   Penicillins Anaphylaxis    Told by a surgeon never to take it again. Has patient had a PCN reaction causing immediate rash, facial/tongue/throat swelling, SOB or lightheadedness with hypotension: Yes Has patient had a PCN reaction causing severe rash involving mucus membranes or skin necrosis: Unknown Has patient had a PCN reaction that required hospitalization: No Has patient had a PCN reaction occurring within the last 10 years: No If all of the above answers are "NO", then may proceed with Cephalosporin use.   Aspirin Other (See Comments)    High-dose caused GI Bleeds   Darvocet [Propoxyphene N-Acetaminophen] Hives   Nitroglycerin Other (See Comments)    IV-BP drops dramatically Can take po   Propoxyphene Hives   Tramadol Hives and Itching   Ultram [Tramadol Hcl] Hives    Valium Other (See Comments)    Circulation problems. "Legs turned black".    Review of Systems: Negative except as noted in the HPI.  Objective: No changes noted in today's physical examination. Shelby Anderson is a pleasant 66 y.o. female in NAD. AAO X 3.  Vascular Examination: CFT <3 seconds b/l LE. Faintly palpable DP pulses b/l. Nonpalpable PT pulses b/l. Pedal hair absent b/l. Skin temperature gradient WNL b/l. No pain with calf compression b/l. No edema b/l LE. No cyanosis or clubbing noted b/l LE.  Dermatological Examination: Pedal integument with normal turgor, texture and tone b/l LE. No open wounds b/l. No interdigital macerations b/l. Toenails 2-5 bilaterally elongated, thickened, discolored with subungual debris. +Tenderness with dorsal palpation of nailplates. Anonychia noted bilateral great toes. Nailbed(s) epithelialized.  Hyperkeratotic lesion(s) submet head 5 b/l.  No erythema, no edema, no drainage, no fluctuance.   Musculoskeletal Examination: Normal muscle strength 5/5 to all lower extremity muscle groups bilaterally. No pain, crepitus or joint limitation noted with ROM b/l LE. No gross bony pedal deformities b/l. Patient ambulates independently without assistive aids.  Neurological Examination: Pt has subjective symptoms of neuropathy. Protective sensation intact 5/5 intact bilaterally with 10g monofilament b/l.     Latest Ref Rng & Units 01/25/2022    1:11 PM  Hemoglobin A1C  Hemoglobin-A1c 4.6 - 6.5 % 7.5    Assessment/Plan: 1. Pain due to onychomycosis of toenails of both feet   2. Callus   3. Diabetes mellitus type 2 with peripheral artery disease ([place])      -Consent given for treatment as described below: -Mycotic toenails 2-5 bilaterally were debrided in length and girth with  sterile nail nippers and dremel without iatrogenic bleeding. -Callus(es) submet head 5 b/l pared utilizing mandrel sander without complication or incident. Total number debrided  =2. -Patient/POA to call should there be question/concern in the interim.   Return in about 3 months (around 09/13/2022).  Marzetta Board, DPM

## 2022-06-19 ENCOUNTER — Ambulatory Visit (HOSPITAL_COMMUNITY)
Admission: EM | Admit: 2022-06-19 | Discharge: 2022-06-19 | Disposition: A | Discharge status: Home / Self Care | Payer: Medicare Other | Attending: Family Medicine | Admitting: Family Medicine

## 2022-06-19 ENCOUNTER — Ambulatory Visit (INDEPENDENT_AMBULATORY_CARE_PROVIDER_SITE_OTHER): Payer: Medicare Other

## 2022-06-19 ENCOUNTER — Encounter (HOSPITAL_COMMUNITY): Payer: Self-pay

## 2022-06-19 DIAGNOSIS — M25531 Pain in right wrist: Secondary | ICD-10-CM | POA: Diagnosis not present

## 2022-06-19 DIAGNOSIS — S99922A Unspecified injury of left foot, initial encounter: Secondary | ICD-10-CM | POA: Diagnosis not present

## 2022-06-19 DIAGNOSIS — M25561 Pain in right knee: Secondary | ICD-10-CM

## 2022-06-19 DIAGNOSIS — W19XXXA Unspecified fall, initial encounter: Secondary | ICD-10-CM | POA: Diagnosis not present

## 2022-06-19 DIAGNOSIS — M7989 Other specified soft tissue disorders: Secondary | ICD-10-CM | POA: Diagnosis not present

## 2022-06-19 DIAGNOSIS — M79672 Pain in left foot: Secondary | ICD-10-CM

## 2022-06-19 DIAGNOSIS — M7732 Calcaneal spur, left foot: Secondary | ICD-10-CM | POA: Diagnosis not present

## 2022-06-19 NOTE — ED Triage Notes (Signed)
Pt states pulled down by her dog today. Pt c/o rt wrist, rt knee, and lt foot pain. Denies LOC.

## 2022-06-19 NOTE — Discharge Instructions (Addendum)
Your x-rays did not show any broken bones  Ice and elevate what ever hurts

## 2022-06-19 NOTE — ED Provider Notes (Signed)
[place]    CSN: [id number] Arrival date & time: 06/19/22  1748      History   Chief Complaint Chief Complaint  Patient presents with   Fall    HPI Shelby Anderson is a 66 y.o. female.    Fall   Here for right wrist pain, right knee pain and left foot pain.  This afternoon about 5 hours ago she was walking her [place] dog when it got spooked by a loud noise and pulled her down and dragged her.  She notes pain and swelling of her right wrist.  She also has pain on her inferomedial aspect of her right knee which shoots downward when she walks.  She also notes pain of the dorsum of her left foot.  She takes oxycodone routinely for chronic pain.  Allergic to a number of pain medications including tramadol and Lodine.  Loading causes anaphylaxis and hives. Past Medical History:  Diagnosis Date   Allergy    Anemia    "chronic"   Angina    Prinzmetal angina   Anxiety and depression    Arthritis    Asthma    Atrial fibrillation ([place])    h/o "AF w/frequent PVCs"   Breast cancer ([place])    Left   Cancer (HCC)    hx of skin cancer    CHF (congestive heart failure) (HCC)    Chronic back pain greater than 3 months duration    on chronic narcotics, treated at pain clinic   Chronic respiratory failure ([place]) 09/15/2015   ONO 09/04/15 + desats >begin O2 at 2l/ m    Colon polyps    hyperplastic   Coronary artery disease    Arrythmia, orthostatic hypotension, HLD, HTN; sees Dr. Einar Gip   Difficult intubation    "TMJ & woke up when they were still cutting on me"   Dysrhythmia    sees Dr. Einar Gip and a cardiologist at [facility]   Esophageal stricture    Family history of melanoma ]   Family history of pancreatic cancer    Fatty liver    Fatty liver    Fibroids    Fibromyalgia    "in my legs"   GERD (gastroesophageal reflux disease)    hx hiatal hernia, stricture and gastric ulcer   Headache(784.0)    migraines   Heart murmur    Hiatal hernia     History of loop recorder    History of migraines    "dx'd when I was in my teens"   Hyperlipemia    Hypertension    Mental disorder    Mild episode of recurrent major depressive disorder ([place]) 12/06/2015   Myocardial infarction [facility]) 1980's & 1990;   sees Dr. Einar Gip   OSA (obstructive sleep apnea)    Personal history of chemotherapy    Personal history of radiation therapy    Pneumonia    multiple times   PONV (postoperative nausea and vomiting)    Recurrent upper respiratory infection (URI)    S/P left TKA 09/25/2016   Shortness of breath 11/20/11   "all the time", sees pulmonlogy, ? asthma   Stenotic cervical os    Stomach ulcer    "3 small; found in 05/2011"   TMJ (dislocation of temporomandibular joint)    Tuberculosis    + TB SKIN TEST   Type 2 diabetes mellitus without complication, without long-term current use of insulin ([place]) 12/06/2015   not on meds  Patient Active Problem List   Diagnosis Date Noted   Dental caries 06/12/2022   Spondylosis without myelopathy or radiculopathy, cervical region 04/21/2021   Neutropenia with fever ([place]) 10/01/2019   Genetic testing 09/17/2019   Family history of melanoma    Family history of pancreatic cancer    Malignant neoplasm of upper-outer quadrant of left breast in female, estrogen receptor negative ([place]) 09/07/2019   Encounter for loop recorder at end of battery life 04/24/2018   Esophageal stricture 07/01/2017   Hyperlipidemia associated with type 2 diabetes mellitus ([place]) 05/20/2017   Allergic rhinitis 03/11/2017   Dilated cardiomyopathy ([place]) 02/07/2017   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 11/13/2016   Morbid obesity due to excess calories ([place]) 09/26/2016   Increased endometrial stripe thickness 06/27/2016   Intramural leiomyoma of uterus 06/27/2016   Type 2 diabetes mellitus without complication, without long-term current use of insulin ([place]) 12/06/2015   Hypertension associated with diabetes ([place])  07/19/2015   Arrhythmia 07/19/2015   Orthostatic hypotension 07/19/2015   GERD (gastroesophageal reflux disease) 07/19/2015   Chronic back pain 07/19/2015   Neuropathy 07/19/2015   Symptomatic PVCs 11/02/2014   Syncope 11/02/2014   Chest pain 12/23/2013   Disorder of cervix 03/10/2013   Vaginal atrophy 03/10/2013   OSA (obstructive sleep apnea) 07/31/2012   Asthma 04/29/2012   Coronary artery disease 11/20/2011    Past Surgical History:  Procedure Laterality Date   ACHILLES TENDON REPAIR  1970's   left ankle   ARTHROSCOPIC REPAIR ACL     left knee cap   BREAST BIOPSY Right 04/08/2013   again in October or November 2020   BREAST LUMPECTOMY Left 12/09/2019   BREAST LUMPECTOMY WITH RADIOACTIVE SEED AND SENTINEL LYMPH NODE BIOPSY Left 12/09/2019   Procedure: LEFT BREAST LUMPECTOMY WITH RADIOACTIVE SEED AND LEFT AXILLARY SENTINEL LYMPH NODE BIOPSY;  Surgeon: Rolm Bookbinder, MD;  Location: [place];  Service: General;  Laterality: Left;  PEC BLOCK   CARDIAC CATHETERIZATION     loop recorder   CARPAL TUNNEL RELEASE  unknown   left hand   COLONOSCOPY     ESOPHAGOGASTRODUODENOSCOPY (EGD) WITH PROPOFOL N/A 10/29/2017   Procedure: ESOPHAGOGASTRODUODENOSCOPY (EGD) WITH PROPOFOL;  Surgeon: Irene Shipper, MD;  Location: [facility];  Service: Endoscopy;  Laterality: N/A;   LOOP RECORDER IMPLANT     PORT-A-CATH REMOVAL N/A 12/09/2019   Procedure: REMOVAL PORT-A-CATH;  Surgeon: Rolm Bookbinder, MD;  Location: [place];  Service: General;  Laterality: N/A;   PORTACATH PLACEMENT N/A 09/23/2019   Procedure: INSERTION PORT-A-CATH Right Internal Doreen Salvage;  Surgeon: Rolm Bookbinder, MD;  Location: [facility] OR;  Service: General;  Laterality: N/A;   post ganglionectomy  1970's   "for migraine headaches"   pouch string  57,01,77   "did this 3 times (once w/each pregnancy)"   SAVORY DILATION N/A 10/29/2017   Procedure: SAVORY DILATION;  Surgeon: Irene Shipper, MD;  Location: [facility]  ENDOS[facility];  Service: Endoscopy;  Laterality: N/A;   TOTAL KNEE ARTHROPLASTY Left 09/25/2016   Procedure: LEFT TOTAL KNEE ARTHROPLASTY;  Surgeon: Paralee Cancel, MD;  Location: [facility];  Service: Orthopedics;  Laterality: Left;   TUBAL LIGATION  1980's    OB History     Gravida  5   Para  3   Term      Preterm      AB  2   Living  3      SAB  2   IAB      Ectopic  Multiple      Live Births               Home Medications    Prior to Admission medications   Medication Sig Start Date End Date Taking? Authorizing Provider  albuterol (PROVENTIL) (2.5 MG/3ML) 0.083% nebulizer solution Take 3 mLs (2.5 mg total) by nebulization every 6 (six) hours as needed for wheezing or shortness of breath (J45.40). 09/19/16   Javier Glazier, MD  albuterol (VENTOLIN HFA) 108 (90 Base) MCG/ACT inhaler TAKE 2 PUFFS BY MOUTH EVERY 6 HOURS AS NEEDED FOR WHEEZE OR SHORTNESS OF BREATH 06/04/22   Parrett, Fonnie Mu, NP  aspirin EC 81 MG tablet Take 81 mg at bedtime by mouth.     [provider]  atorvastatin (LIPITOR) 80 MG tablet Take 1 tablet (80 mg total) by mouth daily. 04/23/22   Koberlein, Steele Berg, MD  Blood Glucose Monitoring Suppl (ONE TOUCH ULTRA 2) w/Device KIT Use as directed 04/06/22   Koberlein, Andris Flurry C, MD  budesonide-formoterol (SYMBICORT) 80-4.5 MCG/ACT inhaler INHALE 2 PUFFS INTO THE LUNGS EVERY MORNING AND ANOTHER 2 PUFFS 12 HOURS LATER 04/10/22   Parrett, Fonnie Mu, NP  budesonide-formoterol (SYMBICORT) 80-4.5 MCG/ACT inhaler Inhale 2 puffs into the lungs every morning and another 2 puffs 12 hours later. 04/10/22   Parrett, Fonnie Mu, NP  buPROPion (WELLBUTRIN XL) 150 MG 24 hr tablet Take 1 tablet by mouth daily. 05/08/22   [provider]  Calcium Carbonate-Vitamin D 600-200 MG-UNIT TABS Take 1 tablet by mouth daily. 01/01/11   [provider]  CINNAMON PO Take 1,000 mg 2 (two) times daily by mouth.    [provider]  doxepin (SINEQUAN) 10 MG  capsule Take by mouth. 05/01/22   [provider]  empagliflozin (JARDIANCE) 25 MG TABS tablet Take 1 tablet (25 mg total) by mouth daily before breakfast. 06/13/22   Dutch Quint B, FNP  esomeprazole (NEXIUM) 40 MG capsule Take 1 capsule (40 mg total) by mouth 2 (two) times daily. 05/08/22   Irene Shipper, MD  famotidine (PEPCID) 20 MG tablet Take 1 tablet (20 mg total) by mouth 2 (two) times daily as needed for heartburn or indigestion. 04/19/22   Irene Shipper, MD  fluticasone [facility]) 50 MCG/ACT nasal spray Place into the nose.    [provider]  furosemide (LASIX) 20 MG tablet TAKE 1 OR 2 TABLETS BY MOUTH EVERY DAY AS DIRECTED 04/25/22   Koberlein, Junell C, MD  glipiZIDE (GLUCOTROL XL) 5 MG 24 hr tablet TAKE 1 TABLET BY MOUTH EVERYDAY WITH BREAKFAST. 05/01/22   Caren Macadam, MD  hydrALAZINE (APRESOLINE) 25 MG tablet Take 1 tablet (25 mg total) by mouth daily as needed (for BP >140/90 mmHg, up to three times daily). 05/08/22 09/05/22  Cantwell, Celeste C, PA-C  hydrocortisone 2.5 % cream Apply topically as needed. 10/06/21   [provider]  isosorbide mononitrate (IMDUR) 60 MG 24 hr tablet Take 1 tablet (60 mg total) by mouth daily. 05/12/22   Dutch Quint B, FNP  L-Methylfolate (DEPLIN) 7.5 MG TABS Take 7.5 mg by mouth daily with breakfast.    [provider]  Lancets (ONETOUCH DELICA PLUS [id number]) MISC USE TO CHECK BLOOD SUGAR 2 TIMES A DAY 10/04/21   Koberlein, Steele Berg, MD  Lancets (ONETOUCH DELICA PLUS [id number]) [place] USE AS DIRECTED 04/02/22   Caren Macadam, MD  levothyroxine (SYNTHROID) 75 MCG tablet Take 1 tablet (75 mcg total) by mouth daily before breakfast. 04/23/22  Koberlein, Junell C, MD  lisinopril (ZESTRIL) 5 MG tablet TAKE 1 TABLET (5 MG TOTAL) BY MOUTH DAILY. 05/01/22   Koberlein, Steele Berg, MD  LYRICA 150 MG capsule Take 150 mg by mouth 3 (three) times daily.  05/28/18   [provider]  metFORMIN (GLUCOPHAGE) 500 MG tablet Take 1  tablet (500 mg total) by mouth 2 (two) times daily with a meal. 04/23/22   Koberlein, Steele Berg, MD  metoprolol succinate (TOPROL-XL) 25 MG 24 hr tablet Take 1 tablet (25 mg total) by mouth daily. TAKE WITH OR IMMEDIATELY FOLLOWING A MEAL. 05/08/22   Cantwell, Celeste C, PA-C  Misc Natural Products (GLUCOS-CHONDROIT-MSM COMPLEX PO) Take 2 tablets by mouth daily.    [provider]  mometasone (ELOCON) 0.1 % cream Apply topically. 09/12/20   [provider]  montelukast (SINGULAIR) 10 MG tablet TAKE 1 TABLET BY MOUTH EVERYDAY AT BEDTIME 02/28/22   Rigoberto Noel, MD  Multiple Vitamin (MULITIVITAMIN WITH MINERALS) TABS Take 1 tablet by mouth daily.    [provider]  mupirocin cream (BACTROBAN) 2 % Apply once daily to lesions on feet as needed. 12/25/19   Marzetta Board, DPM  nitroGLYCERIN (NITROSTAT) 0.4 MG SL tablet Place 1 tablet (0.4 mg total) under the tongue every 5 (five) minutes as needed for chest pain. 07/28/21   Caren Macadam, MD  NURTEC 75 MG TBDP  04/02/22   [provider]  ondansetron (ZOFRAN) 4 MG tablet Take 1 tablet (4 mg total) by mouth every 8 (eight) hours as needed for nausea or vomiting. 04/19/22   Irene Shipper, MD  [facility] ULTRA test strip USE TO CHECK BLOOD SUGAR 2 TIMES A DAY 01/01/22   Koberlein, Steele Berg, MD  oxyCODONE (ROXICODONE) 15 MG immediate release tablet Take 15 mg by mouth 5 (five) times daily. 08/18/21   [provider]  oxyCODONE (ROXICODONE) 15 MG immediate release tablet Take 1 tablet by mouth four times a day 06/06/22     potassium chloride (KLOR-CON) 10 MEQ tablet Take 2 tablets (20 mEq total) by mouth daily. 04/23/22   Caren Macadam, MD  promethazine (PHENERGAN) 25 MG tablet TAKE 1 TABLET BY MOUTH EVERYDAY AT BEDTIME 05/08/22   Irene Shipper, MD  tiZANidine (ZANAFLEX) 4 MG tablet Take 4 mg by mouth every 8 (eight) hours as needed for muscle spasms.    [provider]  vitamin B-12 (CYANOCOBALAMIN) 500  MCG tablet Take 500 mcg by mouth daily.     [provider]  vitamin E 200 UNIT capsule Take 200 Units by mouth daily.    [provider]    Family History Family History  Problem Relation Age of Onset   Malignant hyperthermia Father    Hypertension Father    Heart disease Father    Diabetes Father    Cancer Father        skin   Hypertension Mother    Heart disease Mother    Multiple myeloma Mother    Cancer Sister        CERVICAL   Hypertension Sister    Cancer Brother 19       MELANOMA   Heart disease Maternal Grandmother    Other Maternal Grandmother 32       complications of childbirth   Heart disease Maternal Grandfather    Cancer Paternal Grandmother        ?    Heart disease Paternal Grandmother    Heart disease Paternal  Grandfather    Cancer Brother        LUNG   Diabetes Sister    Hypertension Sister    Heart disease Sister    Cancer Sister    Cancer Brother    Pancreatic cancer Niece 68   Cancer Nephew 71       unknown- currently in the [place]   Anesthesia problems Neg Hx    Hypotension Neg Hx    Pseudochol deficiency Neg Hx    Colon cancer Neg Hx    Esophageal cancer Neg Hx    Rectal cancer Neg Hx    Stomach cancer Neg Hx    Breast cancer Neg Hx     Social History Social History   Tobacco Use   Smoking status: Never   Smokeless tobacco: Never  Vaping Use   Vaping Use: Never used  Substance Use Topics   Alcohol use: No    Alcohol/week: 0.0 standard drinks of alcohol   Drug use: No     Allergies   Lodine [etodolac], Oxycodone, Oxycontin [oxycodone hcl], Penicillins, Aspirin, Darvocet [propoxyphene n-acetaminophen], Nitroglycerin, Propoxyphene, Tramadol, Ultram [tramadol hcl], and Valium   Review of Systems Review of Systems   Physical Exam Triage Vital Signs ED Triage Vitals  Enc Vitals Group     BP 06/19/22 1848 106/63     Pulse Rate 06/19/22 1848 77     Resp 06/19/22 1848 18     Temp 06/19/22 1848 98 F  (36.7 C)     Temp Source 06/19/22 1848 Oral     SpO2 06/19/22 1848 95 %     Weight --      Height --      Head Circumference --      Peak Flow --      Pain Score 06/19/22 1849 9     Pain Loc --      Pain Edu? --      Excl. in [place]? --    No data found.  Updated Vital Signs BP 106/63 (BP Location: Left Arm)   Pulse 77   Temp 98 F (36.7 C) (Oral)   Resp 18   SpO2 95%   Visual Acuity Right Eye Distance:   Left Eye Distance:   Bilateral Distance:    Right Eye Near:   Left Eye Near:    Bilateral Near:     Physical Exam Vitals reviewed.  Constitutional:      General: She is not in acute distress.    Appearance: She is not toxic-appearing.  Musculoskeletal:     Comments: There is swelling an tenderness about the right wrist joint.  Neurovascular is intact distally  There is some tenderness of the inferomedial right knee.  There is no erythema or deformity there.  Range of motion is normal  There is some swelling and tenderness of the dorsum of the left foot.  No obvious deformity  Neurological:     General: No focal deficit present.     Mental Status: She is alert and oriented to person, place, and time.  Psychiatric:        Behavior: Behavior normal.      UC Treatments / Results  Labs (all labs ordered are listed, but only abnormal results are displayed) Labs Reviewed - No data to display  EKG   Radiology DG Foot Complete Left  Result Date: 06/19/2022 CLINICAL DATA:  Fall, injury EXAM: LEFT FOOT - COMPLETE 3+ VIEW COMPARISON:  None Available. FINDINGS: There is no evidence  of fracture or dislocation. Degenerative changes at the first MTP joint. Soft tissues are unremarkable. Small plantar calcaneal spur. IMPRESSION: No acute osseous abnormality. Electronically Signed   By: Donavan Foil M.D.   On: 06/19/2022 20:07   DG Knee AP/LAT W/[place] Right  Result Date: 06/19/2022 CLINICAL DATA:  Knee pain EXAM: RIGHT KNEE 3 VIEWS COMPARISON:  None Available.  FINDINGS: No fracture or malalignment. Moderate tricompartment arthritis of the knee. No significant effusion IMPRESSION: No acute osseous abnormality Electronically Signed   By: Donavan Foil M.D.   On: 06/19/2022 20:06   DG Wrist Complete Right  Result Date: 06/19/2022 CLINICAL DATA:  Fall EXAM: RIGHT WRIST - COMPLETE 3+ VIEW COMPARISON:  None Available. FINDINGS: No acute displaced fracture or malalignment. Advanced arthritis at the first [facility] joint. Diffuse soft tissue swelling. IMPRESSION: No acute osseous abnormality Electronically Signed   By: Donavan Foil M.D.   On: 06/19/2022 20:05    Procedures Procedures (including critical care time)  Medications Ordered in UC Medications - No data to display  Initial Impression / Assessment and Plan / UC Course  I have reviewed the triage vital signs and the nursing notes.  Pertinent labs & imaging results that were available during my care of the patient were reviewed by me and considered in my medical decision making (see chart for details).     X-rays of her wrist, right knee, and left foot, are all negative for fractures.  There is some arthritis present.  We will apply a brace and she can ice and elevate what ever hurts. Final Clinical Impressions(s) / UC Diagnoses   Final diagnoses:  Right wrist pain  Acute pain of right knee  Left foot pain     Discharge Instructions      Your x-rays did not show any broken bones  Ice and elevate what ever hurts     ED Prescriptions   None    I have reviewed the PDMP during this encounter.   Barrett Henle, MD 06/19/22 2013

## 2022-06-25 ENCOUNTER — Other Ambulatory Visit: Payer: Self-pay

## 2022-07-02 NOTE — Telephone Encounter (Signed)
Called patient again to follow up on Medicare extra help status and if patient is needing more Jardiance samples. Did receive paperwork from her that she dropped off last week about still not receiving a denial letter from medicare extra help, despite her husband getting approval. Unable to reach and left a voicemail requesting a call back.

## 2022-07-03 ENCOUNTER — Telehealth: Payer: Self-pay | Admitting: Pharmacist

## 2022-07-03 ENCOUNTER — Other Ambulatory Visit: Payer: Self-pay

## 2022-07-03 NOTE — Chronic Care Management (AMB) (Signed)
Chronic Care Management Pharmacy Assistant   Name: Shelby Anderson  MRN: [id number] DOB: [birth year]  Reason for Encounter: Medication Review / Medication Coordination Call   Recent office visits:  None  Recent consult visits:  06/13/2022 Acquanetta Sit DPM (podiatry) - Patient was seen for Pain due to onychomycosis of toenails of both feet and additional issues. Changed Bupropion to 150 mg 24 hr tab daily. Decreased Doxepin to 10 mg daily.Follow up in 3 months.   06/12/2022 Tammy Parrett NP (pulmonary) - Patient was seen for Mild persistent asthma without complication and additional issues. No medication changes. Follow up in 6 months.   Hospital visits:  Patient was seen at [facility] on 06/19/2022 (1 hr) due to right wrist pain, acute pain for right knee and left foot pain after a fall.    New?Medications Started at [facility] Discharge:?? No new medications Medication Changes at Hospital Discharge: No medication changes Medications Discontinued at Hospital Discharge: No medications discontinued Medications that remain the same after Hospital Discharge:??  -All other medications will remain the same.    Medications: Outpatient Encounter Medications as of 07/03/2022  Medication Sig   albuterol (PROVENTIL) (2.5 MG/3ML) 0.083% nebulizer solution Take 3 mLs (2.5 mg total) by nebulization every 6 (six) hours as needed for wheezing or shortness of breath (J45.40).   albuterol (VENTOLIN HFA) 108 (90 Base) MCG/ACT inhaler TAKE 2 PUFFS BY MOUTH EVERY 6 HOURS AS NEEDED FOR WHEEZE OR SHORTNESS OF BREATH   aspirin EC 81 MG tablet Take 81 mg at bedtime by mouth.    atorvastatin (LIPITOR) 80 MG tablet Take 1 tablet (80 mg total) by mouth daily.   Blood Glucose Monitoring Suppl (ONE TOUCH ULTRA 2) w/Device KIT Use as directed   budesonide-formoterol (SYMBICORT) 80-4.5 MCG/ACT inhaler INHALE 2 PUFFS INTO THE LUNGS EVERY MORNING AND ANOTHER 2 PUFFS 12 HOURS LATER    budesonide-formoterol (SYMBICORT) 80-4.5 MCG/ACT inhaler Inhale 2 puffs into the lungs every morning and another 2 puffs 12 hours later.   buPROPion (WELLBUTRIN XL) 150 MG 24 hr tablet Take 1 tablet by mouth daily.   Calcium Carbonate-Vitamin D 600-200 MG-UNIT TABS Take 1 tablet by mouth daily.   CINNAMON PO Take 1,000 mg 2 (two) times daily by mouth.   doxepin (SINEQUAN) 10 MG capsule Take by mouth.   empagliflozin (JARDIANCE) 25 MG TABS tablet Take 1 tablet (25 mg total) by mouth daily before breakfast.   esomeprazole (NEXIUM) 40 MG capsule Take 1 capsule (40 mg total) by mouth 2 (two) times daily.   famotidine (PEPCID) 20 MG tablet Take 1 tablet (20 mg total) by mouth 2 (two) times daily as needed for heartburn or indigestion.   fluticasone (FLONASE) 50 MCG/ACT nasal spray Place into the nose.   furosemide (LASIX) 20 MG tablet TAKE 1 OR 2 TABLETS BY MOUTH EVERY DAY AS DIRECTED   glipiZIDE (GLUCOTROL XL) 5 MG 24 hr tablet TAKE 1 TABLET BY MOUTH EVERYDAY WITH BREAKFAST.   hydrALAZINE (APRESOLINE) 25 MG tablet Take 1 tablet (25 mg total) by mouth daily as needed (for BP >140/90 mmHg, up to three times daily).   hydrocortisone 2.5 % cream Apply topically as needed.   isosorbide mononitrate (IMDUR) 60 MG 24 hr tablet Take 1 tablet (60 mg total) by mouth daily.   L-Methylfolate (DEPLIN) 7.5 MG TABS Take 7.5 mg by mouth daily with breakfast.   Lancets (ONETOUCH DELICA PLUS [id number]) MISC USE TO CHECK BLOOD SUGAR 2 TIMES A DAY  Lancets (ONETOUCH DELICA PLUS [id number]) MISC USE AS DIRECTED   levothyroxine (SYNTHROID) 75 MCG tablet Take 1 tablet (75 mcg total) by mouth daily before breakfast.   lisinopril (ZESTRIL) 5 MG tablet TAKE 1 TABLET (5 MG TOTAL) BY MOUTH DAILY.   LYRICA 150 MG capsule Take 150 mg by mouth 3 (three) times daily.    metFORMIN (GLUCOPHAGE) 500 MG tablet Take 1 tablet (500 mg total) by mouth 2 (two) times daily with a meal.   metoprolol succinate (TOPROL-XL) 25 MG 24 hr tablet  Take 1 tablet (25 mg total) by mouth daily. TAKE WITH OR IMMEDIATELY FOLLOWING A MEAL.   Misc Natural Products (GLUCOS-CHONDROIT-MSM COMPLEX PO) Take 2 tablets by mouth daily.   mometasone (ELOCON) 0.1 % cream Apply topically.   montelukast (SINGULAIR) 10 MG tablet TAKE 1 TABLET BY MOUTH EVERYDAY AT BEDTIME   Multiple Vitamin (MULITIVITAMIN WITH MINERALS) TABS Take 1 tablet by mouth daily.   mupirocin cream (BACTROBAN) 2 % Apply once daily to lesions on feet as needed.   nitroGLYCERIN (NITROSTAT) 0.4 MG SL tablet Place 1 tablet (0.4 mg total) under the tongue every 5 (five) minutes as needed for chest pain.   NURTEC 75 MG TBDP    ondansetron (ZOFRAN) 4 MG tablet Take 1 tablet (4 mg total) by mouth every 8 (eight) hours as needed for nausea or vomiting.   ONETOUCH ULTRA test strip USE TO CHECK BLOOD SUGAR 2 TIMES A DAY   oxyCODONE (ROXICODONE) 15 MG immediate release tablet Take 15 mg by mouth 5 (five) times daily.   oxyCODONE (ROXICODONE) 15 MG immediate release tablet Take 1 tablet by mouth four times a day   potassium chloride (KLOR-CON) 10 MEQ tablet Take 2 tablets (20 mEq total) by mouth daily.   promethazine (PHENERGAN) 25 MG tablet TAKE 1 TABLET BY MOUTH EVERYDAY AT BEDTIME   tiZANidine (ZANAFLEX) 4 MG tablet Take 4 mg by mouth every 8 (eight) hours as needed for muscle spasms.   vitamin B-12 (CYANOCOBALAMIN) 500 MCG tablet Take 500 mcg by mouth daily.    vitamin E 200 UNIT capsule Take 200 Units by mouth daily.   No facility-administered encounter medications on file as of 07/03/2022.   Reviewed chart for medication changes ahead of medication coordination call.  No OVs, Consults, or hospital visits since last care coordination call/Pharmacist visit. (If appropriate, list visit date, provider name)  No medication changes indicated OR if recent visit, treatment plan here.  BP Readings from Last 3 Encounters:  06/19/22 106/63  06/12/22 100/60  05/11/22 108/64    Lab Results   Component Value Date   HGBA1C 7.5 (H) 01/25/2022     Patient obtains medications through Adherence Packaging  30 Days   Last adherence delivery included: New onboard 04/19/2022  Patient declined last month:    Patient is due for next adherence delivery on: 07/16/2022  Called patient and reviewed medications and coordinated delivery.  This delivery to include: Levothyroxine 75 mcg - 1 tablets before breakfast (verified with patient she is taking 75 mcg daily) Tizanidine 4 mg - 1 tablets at breakfast, lunch and dinner (verified with patient she states she is taking 2 tabs tid) L-methylfolate 7.5 mg - 1 tablet at breakfast Lyrica 150 mg - 1 tablet at breakfast, lunch and dinner Phenergan 25 mg - 1 tablet at bedtime Montelukast 10 mg - 1 tablet at bedtime Potassium 10 meq - 2 tablets at breakfast Metformin 500 mg - 1 tablet at breakfast and dinner Wellbutrin XL  150 mg - 1 tablet at breakfast Isosorbide 60 mg - 1 tablet at breakfast Metoprolol 25 mg  - 1 tablet at breakfast Atorvastatin 80 mg - 1 tablet at bedtime Lisinopril 5 mg - 1 tablet at breakfast Glipizide 5 mg - 1 tablet at breakfast Pepcid 20 mg - 1 tablet at breakfast and dinner Doxepine 10 mg - 3 tablets at dinner    Patient will need a short fill:   Coordinated acute fill:   Patient declined the following medications:  Nexium 40 mg - she is filling this at [facility] due to cost  Confirmed delivery date of 07/16/2022, advised patient that pharmacy will contact them the morning of delivery.  Care Gaps: AWV - scheduled 08/07/2022 Last BP - 106/63 on 06/19/2022 Last A1C - 7.5 on 01/25/2022 Shingrix - never done Covid booster - overdue Flu - due Papsmear - postponed  Star Rating Drugs: Atorvastatin 80 mg - last filled 05/17/2022 56 DS at [facility] Jardiance 25 mg - last filled 02/26/2022 90 DS at [facility] (see note in chart-samples-pap denied) Glipizide 5 mg - last filled 05/17/2022 53 DS at [facility] Lisinopril 5 mg -  last filled 05/01/2022 90 DS at [facility] Metformin 500 mg - last filled 05/11/2022 68 DS at [place] [phone]

## 2022-07-10 ENCOUNTER — Telehealth: Payer: Self-pay | Admitting: Family Medicine

## 2022-07-10 MED ORDER — EMPAGLIFLOZIN 25 MG PO TABS: 25.0000 mg | ORAL_TABLET | Freq: Every day | ORAL | 0 refills | Status: DC

## 2022-07-10 NOTE — Telephone Encounter (Signed)
Pt called to ask if she can pick up samples  of  Jardiance from the Pharmacist. Please advise.  [phone]

## 2022-07-11 NOTE — Telephone Encounter (Signed)
I left a detailed message at the patient's cell number stating Apolonio Schneiders was given a note to enter the information for the samples that were already left at the front desk for the patient.  Requested the patient call back if needed.

## 2022-07-11 NOTE — Telephone Encounter (Signed)
Yes of course no problem!

## 2022-07-12 ENCOUNTER — Telehealth: Payer: Self-pay | Admitting: Adult Health

## 2022-07-12 ENCOUNTER — Other Ambulatory Visit: Payer: Self-pay | Admitting: Family

## 2022-07-13 ENCOUNTER — Telehealth: Payer: Self-pay | Admitting: Pharmacist

## 2022-07-13 ENCOUNTER — Other Ambulatory Visit: Payer: Self-pay | Admitting: Pulmonary Disease

## 2022-07-13 MED ORDER — MONTELUKAST SODIUM 10 MG PO TABS: 10.0000 mg | ORAL_TABLET | Freq: Every day | ORAL | 3 refills | Status: DC

## 2022-07-13 NOTE — Telephone Encounter (Signed)
Rx for pt's montelukast has been sent to pharmacy for pt. Nothing further needed.

## 2022-07-13 NOTE — Telephone Encounter (Signed)
Patient is running out of glipizide, atorvastatin, isosorbide, and metformin and needs refills sent to [facility] pharmacy. Routing to [facility] for refills.

## 2022-07-15 NOTE — Telephone Encounter (Signed)
Ok to refill all meds but she will need to see me to establish care. Thanks!

## 2022-07-16 DIAGNOSIS — G4733 Obstructive sleep apnea (adult) (pediatric): Secondary | ICD-10-CM | POA: Diagnosis not present

## 2022-07-16 DIAGNOSIS — J45909 Unspecified asthma, uncomplicated: Secondary | ICD-10-CM | POA: Diagnosis not present

## 2022-07-16 MED ORDER — METFORMIN HCL 500 MG PO TABS: 500.0000 mg | ORAL_TABLET | Freq: Two times a day (BID) | ORAL | 0 refills | Status: DC

## 2022-07-16 MED ORDER — GLIPIZIDE ER 5 MG PO TB24
ORAL_TABLET | ORAL | 0 refills | Status: DC
Start: 2022-07-16 — End: 2022-10-01

## 2022-07-16 MED ORDER — ATORVASTATIN CALCIUM 80 MG PO TABS: 80.0000 mg | ORAL_TABLET | Freq: Every day | ORAL | 0 refills | Status: DC

## 2022-07-16 MED ORDER — ISOSORBIDE MONONITRATE ER 60 MG PO TB24: 60.0000 mg | ORAL_TABLET | Freq: Every day | ORAL | 0 refills | Status: DC

## 2022-07-16 NOTE — Telephone Encounter (Signed)
Rx done. 

## 2022-07-25 DIAGNOSIS — G894 Chronic pain syndrome: Secondary | ICD-10-CM | POA: Diagnosis not present

## 2022-07-25 DIAGNOSIS — Z79891 Long term (current) use of opiate analgesic: Secondary | ICD-10-CM | POA: Diagnosis not present

## 2022-07-25 DIAGNOSIS — M47816 Spondylosis without myelopathy or radiculopathy, lumbar region: Secondary | ICD-10-CM | POA: Diagnosis not present

## 2022-07-25 DIAGNOSIS — G893 Neoplasm related pain (acute) (chronic): Secondary | ICD-10-CM | POA: Diagnosis not present

## 2022-07-27 ENCOUNTER — Other Ambulatory Visit: Payer: Self-pay | Admitting: *Deleted

## 2022-07-27 DIAGNOSIS — E119 Type 2 diabetes mellitus without complications: Secondary | ICD-10-CM

## 2022-07-27 MED ORDER — LISINOPRIL 5 MG PO TABS: 5.0000 mg | ORAL_TABLET | Freq: Every day | ORAL | 0 refills | Status: DC

## 2022-07-27 NOTE — Telephone Encounter (Signed)
I will fill but she will need an appt in Sept for her 6 month follow up

## 2022-08-01 ENCOUNTER — Telehealth: Payer: Self-pay | Admitting: Pharmacist

## 2022-08-01 NOTE — Chronic Care Management (AMB) (Signed)
Chronic Care Management Pharmacy Assistant   Name: Shelby Anderson  MRN: [id number] DOB: 02-16-1956  Reason for Encounter: Medication Review / Medication Coordination Call  Recent office visits:  None  Recent consult visits:  None  Hospital visits:  None  Medications: Outpatient Encounter Medications as of 08/01/2022  Medication Sig   albuterol (PROVENTIL) (2.5 MG/3ML) 0.083% nebulizer solution Take 3 mLs (2.5 mg total) by nebulization every 6 (six) hours as needed for wheezing or shortness of breath (J45.40).   albuterol (VENTOLIN HFA) 108 (90 Base) MCG/ACT inhaler TAKE 2 PUFFS BY MOUTH EVERY 6 HOURS AS NEEDED FOR WHEEZE OR SHORTNESS OF BREATH   aspirin EC 81 MG tablet Take 81 mg at bedtime by mouth.    atorvastatin (LIPITOR) 80 MG tablet Take 1 tablet (80 mg total) by mouth daily.   Blood Glucose Monitoring Suppl (ONE TOUCH ULTRA 2) w/Device KIT Use as directed   budesonide-formoterol (SYMBICORT) 80-4.5 MCG/ACT inhaler INHALE 2 PUFFS INTO THE LUNGS EVERY MORNING AND ANOTHER 2 PUFFS 12 HOURS LATER   budesonide-formoterol (SYMBICORT) 80-4.5 MCG/ACT inhaler Inhale 2 puffs into the lungs every morning and another 2 puffs 12 hours later.   buPROPion (WELLBUTRIN XL) 150 MG 24 hr tablet Take 1 tablet by mouth daily.   Calcium Carbonate-Vitamin D 600-200 MG-UNIT TABS Take 1 tablet by mouth daily.   CINNAMON PO Take 1,000 mg 2 (two) times daily by mouth.   doxepin (SINEQUAN) 10 MG capsule Take by mouth.   empagliflozin (JARDIANCE) 25 MG TABS tablet Take 1 tablet (25 mg total) by mouth daily before breakfast.   esomeprazole (NEXIUM) 40 MG capsule Take 1 capsule (40 mg total) by mouth 2 (two) times daily.   famotidine (PEPCID) 20 MG tablet Take 1 tablet (20 mg total) by mouth 2 (two) times daily as needed for heartburn or indigestion.   fluticasone (FLONASE) 50 MCG/ACT nasal spray Place into the nose.   furosemide (LASIX) 20 MG tablet TAKE 1 OR 2 TABLETS BY MOUTH EVERY DAY AS  DIRECTED   glipiZIDE (GLUCOTROL XL) 5 MG 24 hr tablet TAKE 1 TABLET BY MOUTH EVERYDAY WITH BREAKFAST.   hydrALAZINE (APRESOLINE) 25 MG tablet Take 1 tablet (25 mg total) by mouth daily as needed (for BP >140/90 mmHg, up to three times daily).   hydrocortisone 2.5 % cream Apply topically as needed.   isosorbide mononitrate (IMDUR) 60 MG 24 hr tablet Take 1 tablet (60 mg total) by mouth daily.   L-Methylfolate (DEPLIN) 7.5 MG TABS Take 7.5 mg by mouth daily with breakfast.   Lancets (ONETOUCH DELICA PLUS [id number]) MISC USE TO CHECK BLOOD SUGAR 2 TIMES A DAY   Lancets (ONETOUCH DELICA PLUS [id number]) MISC USE AS DIRECTED   levothyroxine (SYNTHROID) 75 MCG tablet Take 1 tablet (75 mcg total) by mouth daily before breakfast.   lisinopril (ZESTRIL) 5 MG tablet Take 1 tablet (5 mg total) by mouth daily.   LYRICA 150 MG capsule Take 150 mg by mouth 3 (three) times daily.    metFORMIN (GLUCOPHAGE) 500 MG tablet Take 1 tablet (500 mg total) by mouth 2 (two) times daily with a meal.   metoprolol succinate (TOPROL-XL) 25 MG 24 hr tablet Take 1 tablet (25 mg total) by mouth daily. TAKE WITH OR IMMEDIATELY FOLLOWING A MEAL.   Misc Natural Products (GLUCOS-CHONDROIT-MSM COMPLEX PO) Take 2 tablets by mouth daily.   mometasone (ELOCON) 0.1 % cream Apply topically.   montelukast (SINGULAIR) 10 MG tablet Take 1 tablet (10 mg total)  by mouth at bedtime.   Multiple Vitamin (MULITIVITAMIN WITH MINERALS) TABS Take 1 tablet by mouth daily.   mupirocin cream (BACTROBAN) 2 % Apply once daily to lesions on feet as needed.   nitroGLYCERIN (NITROSTAT) 0.4 MG SL tablet Place 1 tablet (0.4 mg total) under the tongue every 5 (five) minutes as needed for chest pain.   NURTEC 75 MG TBDP    ondansetron (ZOFRAN) 4 MG tablet Take 1 tablet (4 mg total) by mouth every 8 (eight) hours as needed for nausea or vomiting.   ONETOUCH ULTRA test strip USE TO CHECK BLOOD SUGAR 2 TIMES A DAY   oxyCODONE (ROXICODONE) 15 MG immediate release  tablet Take 15 mg by mouth 5 (five) times daily.   oxyCODONE (ROXICODONE) 15 MG immediate release tablet Take 1 tablet by mouth four times a day   potassium chloride (KLOR-CON) 10 MEQ tablet Take 2 tablets (20 mEq total) by mouth daily.   promethazine (PHENERGAN) 25 MG tablet TAKE 1 TABLET BY MOUTH EVERYDAY AT BEDTIME   tiZANidine (ZANAFLEX) 4 MG tablet Take 4 mg by mouth every 8 (eight) hours as needed for muscle spasms.   vitamin B-12 (CYANOCOBALAMIN) 500 MCG tablet Take 500 mcg by mouth daily.    vitamin E 200 UNIT capsule Take 200 Units by mouth daily.   No facility-administered encounter medications on file as of 08/01/2022.   Reviewed chart for medication changes ahead of medication coordination call.  BP Readings from Last 3 Encounters:  06/19/22 106/63  06/12/22 100/60  05/11/22 108/64    Lab Results  Component Value Date   HGBA1C 7.5 (H) 01/25/2022     Patient obtains medications through Adherence Packaging  30 Days    Last adherence delivery included:  Levothyroxine 75 mcg - 1 tablets before breakfast (verified with patient she is taking 75 mcg daily) Tizanidine 4 mg - 1 tablets at breakfast, lunch and dinner (verified with patient she states she is taking 2 tabs tid) L-methylfolate 7.5 mg - 1 tablet at breakfast Lyrica 150 mg - 1 tablet at breakfast, lunch and dinner Phenergan 25 mg - 1 tablet at bedtime Montelukast 10 mg - 1 tablet at bedtime Potassium 10 meq - 2 tablets at breakfast Metformin 500 mg - 1 tablet at breakfast and dinner Wellbutrin XL 150 mg - 1 tablet at breakfast Isosorbide 60 mg - 1 tablet at breakfast Metoprolol 25 mg  - 1 tablet at breakfast Atorvastatin 80 mg - 1 tablet at bedtime Lisinopril 5 mg - 1 tablet at breakfast Glipizide 5 mg - 1 tablet at breakfast Pepcid 20 mg - 1 tablet at breakfast and dinner Doxepine 10 mg - 3 tablets at dinner    Patient declined last month:   Nexium 40 mg - she is filling this at [facility] due to cost   Patient is  due for next adherence delivery on: 08/14/2022   Called patient and reviewed medications and coordinated delivery.   This delivery to include: Levothyroxine 75 mcg - 1 tablets before breakfast  Tizanidine 4 mg - 2 tablets at breakfast, lunch and dinner (verified with patient she states she is taking 2 tabs tid) L-methylfolate 7.5 mg - 1 tablet at breakfast Lyrica 150 mg - 1 tablet at breakfast, lunch and dinner Phenergan 25 mg - 1 tablet at bedtime Montelukast 10 mg - 1 tablet at bedtime Potassium 10 meq - 2 tablets at breakfast Metformin 500 mg - 1 tablet at breakfast and dinner Wellbutrin XL 150 mg - 1  tablet at breakfast Isosorbide 60 mg - 1 tablet at breakfast Metoprolol 25 mg  - 1 tablet at breakfast Atorvastatin 80 mg - 1 tablet at bedtime Lisinopril 5 mg - 1 tablet at breakfast Glipizide 5 mg - 1 tablet at breakfast Pepcid 20 mg - 1 tablet at breakfast and dinner Doxepine 10 mg - 3 tablets at dinner      Patient will need a short fill:    Coordinated acute fill:    Patient declined the following medications:    Unable to confirmed delivery date of 08/14/2022 after several attempts.   Care Gaps: AWV - scheduled 08/07/2022 Last BP - 106/63 on 06/19/2022 Last A1C - 7.5 on 01/25/2022 Shingrix - never done Covid booster - overdue Flu - due HGA1C - overdue Pap smear - postponed  Star Rating Drugs: Atorvastatin 80 mg - last filled 07/16/2022 30 DS at [facility] Jardiance 25 mg - last filled 02/26/2022 90 [facility] (see note in chart-samples-pap denied) Glipizide 5 mg - last filled 07/16/2022 30 DS at [facility] Lisinopril 5 mg - last filled 07/12/2022 30 DS at [facility] Metformin 500 mg - last filled 07/16/2022 30 DS at [place] [phone]

## 2022-08-03 MED ORDER — EMPAGLIFLOZIN 25 MG PO TABS: 25.0000 mg | ORAL_TABLET | Freq: Every day | ORAL | 1 refills | Status: DC

## 2022-08-03 NOTE — Telephone Encounter (Signed)
Message sent to Dr Legrand Como to request approval.

## 2022-08-03 NOTE — Telephone Encounter (Signed)
Rx done. 

## 2022-08-03 NOTE — Telephone Encounter (Signed)
Ok to refill 

## 2022-08-03 NOTE — Addendum Note (Signed)
Addended by: Agnes Lawrence on: 08/03/2022 04:16 PM   Modules accepted: Orders

## 2022-08-07 ENCOUNTER — Ambulatory Visit (INDEPENDENT_AMBULATORY_CARE_PROVIDER_SITE_OTHER): Payer: Medicare Other

## 2022-08-07 VITALS — BP 100/60 | HR 74 | Temp 98.4°F | Ht 65.0 in | Wt 217.6 lb

## 2022-08-07 DIAGNOSIS — Z0000 Encounter for general adult medical examination without abnormal findings: Secondary | ICD-10-CM | POA: Diagnosis not present

## 2022-08-07 NOTE — Patient Instructions (Signed)
Shelby Anderson , Thank you for taking time to come for your Medicare Wellness Visit. I appreciate your ongoing commitment to your health goals. Please review the following plan we discussed and let me know if I can assist you in the future.   Screening recommendations/referrals: Colonoscopy: completed 07/10/2021, due 07/11/2031 Mammogram: completed 01/22/2022, due 01/23/2023 Bone Density: n/a Recommended yearly ophthalmology/optometry visit for glaucoma screening and checkup Recommended yearly dental visit for hygiene and checkup  Vaccinations: Influenza vaccine: due Pneumococcal vaccine: n/a Tdap vaccine: completed 06/28/2014, due 06/28/2024 Shingles vaccine: discussed  Covid-19:  08/03/2020, 04/08/2020, 03/11/2020  Advanced directives: Please bring a copy of your POA (Power of Attorney) and/or Living Will to your next appointment.   Conditions/risks identified: none  Next appointment: Follow up in one year for your annual wellness visit.   Preventive Care 40-64 Years, Female Preventive care refers to lifestyle choices and visits with your health care provider that can promote health and wellness. What does preventive care include? A yearly physical exam. This is also called an annual well check. Dental exams once or twice a year. Routine eye exams. Ask your health care provider how often you should have your eyes checked. Personal lifestyle choices, including: Daily care of your teeth and gums. Regular physical activity. Eating a healthy diet. Avoiding tobacco and drug use. Limiting alcohol use. Practicing safe sex. Taking low-dose aspirin daily starting at age 49. Taking vitamin and mineral supplements as recommended by your health care provider. What happens during an annual well check? The services and screenings done by your health care provider during your annual well check will depend on your age, overall health, lifestyle risk factors, and family history of disease. Counseling   Your health care provider may ask you questions about your: Alcohol use. Tobacco use. Drug use. Emotional well-being. Home and relationship well-being. Sexual activity. Eating habits. Work and work Statistician. Method of birth control. Menstrual cycle. Pregnancy history. Screening  You may have the following tests or measurements: Height, weight, and BMI. Blood pressure. Lipid and cholesterol levels. These may be checked every 5 years, or more frequently if you are over 76 years old. Skin check. Lung cancer screening. You may have this screening every year starting at age 62 if you have a 30-pack-year history of smoking and currently smoke or have quit within the past 15 years. Fecal occult blood test (FOBT) of the stool. You may have this test every year starting at age 69. Flexible sigmoidoscopy or colonoscopy. You may have a sigmoidoscopy every 5 years or a colonoscopy every 10 years starting at age 6. Hepatitis C blood test. Hepatitis B blood test. Sexually transmitted disease (STD) testing. Diabetes screening. This is done by checking your blood sugar (glucose) after you have not eaten for a while (fasting). You may have this done every 1-3 years. Mammogram. This may be done every 1-2 years. Talk to your health care provider about when you should start having regular mammograms. This may depend on whether you have a family history of breast cancer. BRCA-related cancer screening. This may be done if you have a family history of breast, ovarian, tubal, or peritoneal cancers. Pelvic exam and Pap test. This may be done every 3 years starting at age 52. Starting at age 29, this may be done every 5 years if you have a Pap test in combination with an HPV test. Bone density scan. This is done to screen for osteoporosis. You may have this scan if you are at high risk  for osteoporosis. Discuss your test results, treatment options, and if necessary, the need for more tests with your health  care provider. Vaccines  Your health care provider may recommend certain vaccines, such as: Influenza vaccine. This is recommended every year. Tetanus, diphtheria, and acellular pertussis (Tdap, Td) vaccine. You may need a Td booster every 10 years. Zoster vaccine. You may need this after age 25. Pneumococcal 13-valent conjugate (PCV13) vaccine. You may need this if you have certain conditions and were not previously vaccinated. Pneumococcal polysaccharide (PPSV23) vaccine. You may need one or two doses if you smoke cigarettes or if you have certain conditions. Talk to your health care provider about which screenings and vaccines you need and how often you need them. This information is not intended to replace advice given to you by your health care provider. Make sure you discuss any questions you have with your health care provider. Document Released: 12/16/2015 Document Revised: 08/08/2016 Document Reviewed: 09/20/2015 Elsevier Interactive Patient Education  2017 [place] Prevention in the Home Falls can cause injuries. They can happen to people of all ages. There are many things you can do to make your home safe and to help prevent falls. What can I do on the outside of my home? Regularly fix the edges of walkways and driveways and fix any cracks. Remove anything that might make you trip as you walk through a door, such as a raised step or threshold. Trim any bushes or trees on the path to your home. Use bright outdoor lighting. Clear any walking paths of anything that might make someone trip, such as rocks or tools. Regularly check to see if handrails are loose or broken. Make sure that both sides of any steps have handrails. Any raised decks and porches should have guardrails on the edges. Have any leaves, snow, or ice cleared regularly. Use sand or salt on walking paths during winter. Clean up any spills in your garage right away. This includes oil or grease  spills. What can I do in the bathroom? Use night lights. Install grab bars by the toilet and in the tub and shower. Do not use towel bars as grab bars. Use non-skid mats or decals in the tub or shower. If you need to sit down in the shower, use a plastic, non-slip stool. Keep the floor dry. Clean up any water that spills on the floor as soon as it happens. Remove soap buildup in the tub or shower regularly. Attach bath mats securely with double-sided non-slip rug tape. Do not have throw rugs and other things on the floor that can make you trip. What can I do in the bedroom? Use night lights. Make sure that you have a light by your bed that is easy to reach. Do not use any sheets or blankets that are too big for your bed. They should not hang down onto the floor. Have a firm chair that has side arms. You can use this for support while you get dressed. Do not have throw rugs and other things on the floor that can make you trip. What can I do in the kitchen? Clean up any spills right away. Avoid walking on wet floors. Keep items that you use a lot in easy-to-reach places. If you need to reach something above you, use a strong step stool that has a grab bar. Keep electrical cords out of the way. Do not use floor polish or wax that makes floors slippery. If you must  use wax, use non-skid floor wax. Do not have throw rugs and other things on the floor that can make you trip. What can I do with my stairs? Do not leave any items on the stairs. Make sure that there are handrails on both sides of the stairs and use them. Fix handrails that are broken or loose. Make sure that handrails are as long as the stairways. Check any carpeting to make sure that it is firmly attached to the stairs. Fix any carpet that is loose or worn. Avoid having throw rugs at the top or bottom of the stairs. If you do have throw rugs, attach them to the floor with carpet tape. Make sure that you have a light switch at the  top of the stairs and the bottom of the stairs. If you do not have them, ask someone to add them for you. What else can I do to help prevent falls? Wear shoes that: Do not have high heels. Have rubber bottoms. Are comfortable and fit you well. Are closed at the toe. Do not wear sandals. If you use a stepladder: Make sure that it is fully opened. Do not climb a closed stepladder. Make sure that both sides of the stepladder are locked into place. Ask someone to hold it for you, if possible. Clearly mark and make sure that you can see: Any grab bars or handrails. First and last steps. Where the edge of each step is. Use tools that help you move around (mobility aids) if they are needed. These include: Canes. Walkers. Scooters. Crutches. Turn on the lights when you go into a dark area. Replace any light bulbs as soon as they burn out. Set up your furniture so you have a clear path. Avoid moving your furniture around. If any of your floors are uneven, fix them. If there are any pets around you, be aware of where they are. Review your medicines with your doctor. Some medicines can make you feel dizzy. This can increase your chance of falling. Ask your doctor what other things that you can do to help prevent falls. This information is not intended to replace advice given to you by your health care provider. Make sure you discuss any questions you have with your health care provider. Document Released: 09/15/2009 Document Revised: 04/26/2016 Document Reviewed: 12/24/2014 Elsevier Interactive Patient Education  2017 [employer].

## 2022-08-07 NOTE — Progress Notes (Signed)
Subjective:   Shelby Anderson is a 66 y.o. female who presents for Medicare Annual (Subsequent) preventive examination.  Review of Systems     Cardiac Risk Factors include: diabetes mellitus;dyslipidemia;hypertension;obesity (BMI >30kg/m2)     Objective:    Today's Vitals   08/07/22 1507 08/07/22 1512  BP: 100/60   Pulse: 74   Temp: 98.4 F (36.9 C)   TempSrc: Oral   SpO2: 94%   Weight: 217 lb 9.6 oz (98.7 kg)   Height: _0  (1.651 m)   PainSc:  6    Body mass index is 36.21 kg/m.     08/07/2022    3:21 PM 02/28/2022   10:29 AM 01/29/2022    1:27 PM 01/08/2022   10:42 AM 07/18/2021    3:40 PM 12/31/2019    4:36 PM 12/08/2019    9:26 AM  Advanced Directives  Does Patient Have a Medical Advance Directive? _1  No Yes  Type of Paramedic of [place];Living will Living will [place];Living will [place];Living will;Out of facility DNR (pink MOST or yellow form) Healthcare Power of [place]  Does patient want to make changes to medical advance directive?  No - Patient declined No - Patient declined    No - Patient declined  Copy of [place] in Chart? No - copy requested  Yes - validated most recent copy scanned in chart (See row information)    No - copy requested  Would patient like information on creating a medical advance directive?      No - Patient declined     Current Medications (verified) Outpatient Encounter Medications as of 08/07/2022  Medication Sig   albuterol (PROVENTIL) (2.5 MG/3ML) 0.083% nebulizer solution Take 3 mLs (2.5 mg total) by nebulization every 6 (six) hours as needed for wheezing or shortness of breath (J45.40).   albuterol (VENTOLIN HFA) 108 (90 Base) MCG/ACT inhaler TAKE 2 PUFFS BY MOUTH EVERY 6 HOURS AS NEEDED FOR WHEEZE OR SHORTNESS OF BREATH   aspirin EC 81 MG tablet Take 81 mg at bedtime by mouth.    atorvastatin  (LIPITOR) 80 MG tablet Take 1 tablet (80 mg total) by mouth daily.   Blood Glucose Monitoring Suppl (ONE TOUCH ULTRA 2) w/Device KIT Use as directed   budesonide-formoterol (SYMBICORT) 80-4.5 MCG/ACT inhaler INHALE 2 PUFFS INTO THE LUNGS EVERY MORNING AND ANOTHER 2 PUFFS 12 HOURS LATER   buPROPion (WELLBUTRIN XL) 150 MG 24 hr tablet Take 1 tablet by mouth daily.   Calcium Carbonate-Vitamin D 600-200 MG-UNIT TABS Take 1 tablet by mouth daily.   CINNAMON PO Take 1,000 mg 2 (two) times daily by mouth.   doxepin (SINEQUAN) 10 MG capsule Take by mouth.   empagliflozin (JARDIANCE) 25 MG TABS tablet Take 1 tablet (25 mg total) by mouth daily before breakfast.   esomeprazole (NEXIUM) 40 MG capsule Take 1 capsule (40 mg total) by mouth 2 (two) times daily.   famotidine (PEPCID) 20 MG tablet Take 1 tablet (20 mg total) by mouth 2 (two) times daily as needed for heartburn or indigestion.   fluticasone (FLONASE) 50 MCG/ACT nasal spray Place into the nose.   furosemide (LASIX) 20 MG tablet TAKE 1 OR 2 TABLETS BY MOUTH EVERY DAY AS DIRECTED   glipiZIDE (GLUCOTROL XL) 5 MG 24 hr tablet TAKE 1 TABLET BY MOUTH EVERYDAY WITH BREAKFAST.   hydrALAZINE (APRESOLINE) 25 MG tablet Take 1 tablet (  25 mg total) by mouth daily as needed (for BP >140/90 mmHg, up to three times daily).   hydrocortisone 2.5 % cream Apply topically as needed.   isosorbide mononitrate (IMDUR) 60 MG 24 hr tablet Take 1 tablet (60 mg total) by mouth daily.   L-Methylfolate (DEPLIN) 7.5 MG TABS Take 7.5 mg by mouth daily with breakfast.   Lancets (ONETOUCH DELICA PLUS [id number]) MISC USE TO CHECK BLOOD SUGAR 2 TIMES A DAY   Lancets (ONETOUCH DELICA PLUS [id number]) MISC USE AS DIRECTED   levothyroxine (SYNTHROID) 75 MCG tablet Take 1 tablet (75 mcg total) by mouth daily before breakfast.   lisinopril (ZESTRIL) 5 MG tablet Take 1 tablet (5 mg total) by mouth daily.   LYRICA 150 MG capsule Take 150 mg by mouth 3 (three) times daily.    metFORMIN  (GLUCOPHAGE) 500 MG tablet Take 1 tablet (500 mg total) by mouth 2 (two) times daily with a meal.   metoprolol succinate (TOPROL-XL) 25 MG 24 hr tablet Take 1 tablet (25 mg total) by mouth daily. TAKE WITH OR IMMEDIATELY FOLLOWING A MEAL.   Misc Natural Products (GLUCOS-CHONDROIT-MSM COMPLEX PO) Take 2 tablets by mouth daily.   mometasone (ELOCON) 0.1 % cream Apply topically.   montelukast (SINGULAIR) 10 MG tablet Take 1 tablet (10 mg total) by mouth at bedtime.   Multiple Vitamin (MULITIVITAMIN WITH MINERALS) TABS Take 1 tablet by mouth daily.   mupirocin cream (BACTROBAN) 2 % Apply once daily to lesions on feet as needed.   nitroGLYCERIN (NITROSTAT) 0.4 MG SL tablet Place 1 tablet (0.4 mg total) under the tongue every 5 (five) minutes as needed for chest pain.   NURTEC 75 MG TBDP    ondansetron (ZOFRAN) 4 MG tablet Take 1 tablet (4 mg total) by mouth every 8 (eight) hours as needed for nausea or vomiting.   ONETOUCH ULTRA test strip USE TO CHECK BLOOD SUGAR 2 TIMES A DAY   oxyCODONE (ROXICODONE) 15 MG immediate release tablet Take 15 mg by mouth 5 (five) times daily.   oxyCODONE (ROXICODONE) 15 MG immediate release tablet Take 1 tablet by mouth four times a day   potassium chloride (KLOR-CON) 10 MEQ tablet Take 2 tablets (20 mEq total) by mouth daily.   promethazine (PHENERGAN) 25 MG tablet TAKE 1 TABLET BY MOUTH EVERYDAY AT BEDTIME   tiZANidine (ZANAFLEX) 4 MG tablet Take 4 mg by mouth every 8 (eight) hours as needed for muscle spasms.   vitamin B-12 (CYANOCOBALAMIN) 500 MCG tablet Take 500 mcg by mouth daily.    vitamin E 200 UNIT capsule Take 200 Units by mouth daily.   budesonide-formoterol (SYMBICORT) 80-4.5 MCG/ACT inhaler Inhale 2 puffs into the lungs every morning and another 2 puffs 12 hours later.   empagliflozin (JARDIANCE) 25 MG TABS tablet Take 1 tablet (25 mg total) by mouth daily before breakfast.   No facility-administered encounter medications on file as of 08/07/2022.     Allergies (verified) Lodine [etodolac], Oxycodone, Oxycontin [oxycodone hcl], Penicillins, Aspirin, Darvocet [propoxyphene n-acetaminophen], Nitroglycerin, Propoxyphene, Tramadol, Ultram [tramadol hcl], and Valium   History: Past Medical History:  Diagnosis Date   Allergy    Anemia    "chronic"   Angina    Prinzmetal angina   Anxiety and depression    Arthritis    Asthma    Atrial fibrillation ([place])    h/o "AF w/frequent PVCs"   Breast cancer ([place])    Left   Cancer ([place])    hx of skin cancer  CHF (congestive heart failure) (HCC)    Chronic back pain greater than 3 months duration    on chronic narcotics, treated at pain clinic   Chronic respiratory failure ([place]) 09/15/2015   ONO 09/04/15 + desats >begin O2 at 2l/ m    Colon polyps    hyperplastic   Coronary artery disease    Arrythmia, orthostatic hypotension, HLD, HTN; sees Dr. Einar Gip   Difficult intubation    "TMJ & woke up when they were still cutting on me"   Dysrhythmia    sees Dr. 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 Einar Gip   OSA (obstructive sleep apnea)    Personal history of chemotherapy    Personal history of radiation therapy    Pneumonia    multiple times   PONV (postoperative nausea and vomiting)    Recurrent upper respiratory infection (URI)    S/P left TKA 09/25/2016   Shortness of breath 11/20/11   "all the  time", sees pulmonlogy, ? asthma   Stenotic cervical os    Stomach ulcer    "3 small; found in 05/2011"   TMJ (dislocation of temporomandibular joint)    Tuberculosis    + TB SKIN TEST   Type 2 diabetes mellitus without complication, without long-term current use of insulin ([place]) 12/06/2015   not on meds    Past Surgical History:  Procedure Laterality Date   ACHILLES TENDON REPAIR  1970's   left ankle   ARTHROSCOPIC REPAIR ACL     left knee cap   BREAST BIOPSY Right 04/08/2013   again in October or November 2020   BREAST LUMPECTOMY Left 12/09/2019   BREAST LUMPECTOMY WITH RADIOACTIVE SEED AND SENTINEL LYMPH NODE BIOPSY Left 12/09/2019   Procedure: LEFT BREAST LUMPECTOMY WITH RADIOACTIVE SEED AND LEFT AXILLARY SENTINEL LYMPH NODE BIOPSY;  Surgeon: Rolm Bookbinder, MD;  Location: [facility];  Service: General;  Laterality: Left;  PEC BLOCK   CARDIAC CATHETERIZATION     loop recorder   CARPAL TUNNEL RELEASE  unknown   left hand   COLONOSCOPY     ESOPHAGOGASTRODUODENOSCOPY (EGD) WITH PROPOFOL N/A 10/29/2017   Procedure: ESOPHAGOGASTRODUODENOSCOPY (EGD) WITH PROPOFOL;  Surgeon: Irene Shipper, MD;  [facility];  Service: Endoscopy;  Laterality: N/A;   LOOP RECORDER IMPLANT     PORT-A-CATH REMOVAL N/A 12/09/2019   Procedure: REMOVAL PORT-A-CATH;  Surgeon: Rolm Bookbinder, MD;  Location: [facility];  Service: General;  Laterality: N/A;   PORTACATH PLACEMENT N/A 09/23/2019   Procedure: INSERTION PORT-A-CATH Right Internal Doreen Salvage;  Surgeon: Rolm Bookbinder, MD;  Location: [facility];  Service: General;  Laterality: N/A;   post ganglionectomy  1970's   "for migraine headaches"   pouch string  70,35,00   "  did this 3 times (once w/each pregnancy)"   SAVORY DILATION N/A 10/29/2017   Procedure: SAVORY DILATION;  Surgeon: Irene Shipper, MD;  [facility];  Service: Endoscopy;  Laterality: N/A;   TOTAL KNEE ARTHROPLASTY Left 09/25/2016   Procedure: LEFT TOTAL KNEE  ARTHROPLASTY;  Surgeon: Paralee Cancel, MD;  [facility];  Service: Orthopedics;  Laterality: Left;   TUBAL LIGATION  41's   Family History  Problem Relation Age of Onset   Malignant hyperthermia Father    Hypertension Father    Heart disease Father    Diabetes Father    Cancer Father        skin   Hypertension Mother    Heart disease Mother    Multiple myeloma Mother    Cancer Sister        CERVICAL   Hypertension Sister    Cancer Brother 57       MELANOMA   Heart disease Maternal Grandmother    Other Maternal Grandmother 32       complications of childbirth   Heart disease Maternal Grandfather    Cancer Paternal Grandmother        ?    Heart disease Paternal Grandmother    Heart disease Paternal Grandfather    Cancer Brother        LUNG   Diabetes Sister    Hypertension Sister    Heart disease Sister    Cancer Sister    Cancer Brother    Pancreatic cancer Niece 75   Cancer Nephew 71       unknown- currently in the [employer]   Anesthesia problems Neg Hx    Hypotension Neg Hx    Pseudochol deficiency Neg Hx    Colon cancer Neg Hx    Esophageal cancer Neg Hx    Rectal cancer Neg Hx    Stomach cancer Neg Hx    Breast cancer Neg Hx    Social History   Socioeconomic History   Marital status: Married    Spouse name: Not on file   Number of children: 3   Years of education: 15   Highest education level: Some college, no degree  Occupational History   Occupation: Retired Therapist, sports  Tobacco Use   Smoking status: Never   Smokeless tobacco: Never  Vaping Use   Vaping Use: Never used  Substance and Sexual Activity   Alcohol use: No    Alcohol/week: 0.0 standard drinks of alcohol   Drug use: No   Sexual activity: Not Currently    Birth control/protection: Surgical    Comment: 1st intercourse 66 yo-Fewer than 5 partners  Other Topics Concern   Not on file  Social History Narrative   Right handed   One story home   3 children   Has custody of 2 grandchildren  ages 37 & 57    Social Determinants of Health   Financial Resource Strain: Medium Risk (08/07/2022)   Overall Financial Resource Strain (CARDIA)    Difficulty of Paying Living Expenses: Somewhat hard  Food Insecurity: No Food Insecurity (08/07/2022)   Hunger Vital Sign    Worried About Running Out of Food in the Last Year: Never true    Ran Out of Food in the Last Year: Never true  Transportation Needs: No Transportation Needs (08/07/2022)   PRAPARE - Hydrologist (Medical): No    Lack of Transportation (Non-Medical): No  Physical Activity: Sufficiently Active (08/07/2022)   Exercise Vital Sign  Days of Exercise per Week: 4 days    Minutes of Exercise per Session: 50 min  Stress: No Stress Concern Present (08/07/2022)   [place]    Feeling of Stress : Not at all  Social Connections: Moderately Isolated (07/18/2021)   Social Connection and Isolation Panel [NHANES]    Frequency of Communication with Friends and Family: Once a week    Frequency of Social Gatherings with Friends and Family: Once a week    Attends Religious Services: More than 4 times per year    Active Member of [employer] or Organizations: No    Attends Music therapist: Never    Marital Status: Married    Tobacco Counseling Counseling given: Not Answered   Clinical Intake:  Pre-visit preparation completed: Yes  Pain : 0-10 Pain Score: 6  Pain Type: Chronic pain Pain Location: Back Pain Orientation: Lower Pain Descriptors / Indicators: Aching Pain Onset: More than a month ago Pain Frequency: Constant     Nutritional Status: BMI > 30  Obese Nutritional Risks: Nausea/ vomitting/ diarrhea (has chronic nausea due to reflux) Diabetes: Yes  How often do you need to have someone help you when you read instructions, pamphlets, or other written materials from your doctor or pharmacy?: 1 - Never What is the last  grade level you completed in school?: asociates degree  Diabetic? Yes Nutrition Risk Assessment:  Has the patient had any N/V/D within the last 2 months?  Yes  Does the patient have any non-healing wounds?  Yes  Has the patient had any unintentional weight loss or weight gain?  No   Diabetes:  Is the patient diabetic?  Yes  If diabetic, was a CBG obtained today?  No  Did the patient bring in their glucometer from home?  No  How often do you monitor your CBG's? Twice daily.   Financial Strains and Diabetes Management:  Are you having any financial strains with the device, your supplies or your medication? No .  Does the patient want to be seen by Chronic Care Management for management of their diabetes?  No  Would the patient like to be referred to a Nutritionist or for Diabetic Management?  No   Diabetic Exams:  Diabetic Eye Exam: Completed 04/17/2022 Diabetic Foot Exam: Completed 12/06/2021   Interpreter Needed?: No  Information entered by :: NAllen LPN   Activities of Daily Living    08/07/2022    3:31 PM  In your present state of health, do you have any difficulty performing the following activities:  Hearing? 1  Comment decreased on right side  Vision? 1  Comment trouble close up  Difficulty concentrating or making decisions? 1  Comment some trouble with memory  Walking or climbing stairs? 0  Dressing or bathing? 0  Doing errands, shopping? 0  Preparing Food and eating ? N  Using the Toilet? N  In the past six months, have you accidently leaked urine? N  Do you have problems with loss of bowel control? N  Managing your Medications? N  Managing your Finances? N  Housekeeping or managing your Housekeeping? N    Patient Care Team: Farrel Conners, MD as PCP - General (Family Medicine) Rigoberto Noel, MD as Consulting Physician (Pulmonary Disease) Adrian Prows, MD as Consulting Physician (Cardiology) Lenon Oms, MD as Referring Physician (Obstetrics and  Gynecology) Veneda Melter, MD as Referring Physician (Cardiology) [name], MD as Consulting Physician (Anesthesiology) Rolm Bookbinder,  MD as Consulting Physician (General Surgery) Nicholas Lose, MD as Consulting Physician (Hematology and Oncology) Kyung Rudd, MD as Consulting Physician ([place]) Alda Berthold, DO as Consulting Physician (Neurology) Jarome Matin, MD as Consulting Physician (Dermatology) Viona Gilmore, [facility] as Pharmacist (Pharmacist)  Indicate any recent Medical Services you may have received from other than [facility] providers in the past year (date may be approximate).     Assessment:   This is a routine wellness examination for [place].  Hearing/Vision screen Vision Screening - Comments:: Regular eye exams, [facility]  Dietary issues and exercise activities discussed: Current Exercise Habits: Home exercise routine, Type of exercise: walking, Time (Minutes): 45, Frequency (Times/Week): 4, Weekly Exercise (Minutes/Week): 180   Goals Addressed             This Visit's Progress    Patient Stated       08/07/2022, wants to lose weight       Depression Screen    08/07/2022    3:25 PM 05/11/2022    1:22 PM 02/05/2022   11:14 AM 01/29/2022    1:26 PM 07/18/2021    3:35 PM 02/24/2021    4:30 PM 01/06/2021    4:53 PM  PHQ 2/9 Scores  PHQ - 2 Score _0 PHQ- 9 Score _1 Fall Risk    08/07/2022    3:22 PM 05/11/2022    1:22 PM 01/29/2022    1:25 PM 01/08/2022   10:42 AM 07/18/2021    3:44 PM  Fall Risk   Falls in the past year? _2 Comment gets dizzy      Number falls in past yr: _3 Injury with Fall? 0 _4 Comment   fell over book bag, skinned knees and hit back  head injury  Risk for fall due to : Medication side effect;History of fall(s) History of fall(s);Orthopedic patient;Impaired balance/gait History of fall(s);Impaired balance/gait;Impaired mobility  Impaired balance/gait;Impaired  mobility;Impaired vision;History of fall(s)  Follow up Falls evaluation completed;Education provided;Falls prevention discussed Falls evaluation completed Education provided;Falls prevention discussed  Falls prevention discussed    FALL RISK PREVENTION PERTAINING TO THE HOME:  Any stairs in or around the home? Yes  If so, are there any without handrails? Yes  Home free of loose throw rugs in walkways, pet beds, electrical cords, etc? Yes  Adequate lighting in your home to reduce risk of falls? Yes   ASSISTIVE DEVICES UTILIZED TO PREVENT FALLS:  Life alert? No  Use of a cane, walker or w/c? No  Grab bars in the bathroom? Yes  Shower chair or bench in shower? No  Elevated toilet seat or a handicapped toilet? No   TIMED UP AND GO:  Was the test performed? Yes .  Length of time to ambulate 10 feet: 5 sec.   Gait slow and steady without use of assistive device  Cognitive Function:        08/07/2022    3:34 PM 07/18/2021    3:47 PM  6CIT Screen  What Year? 0 points 0 points  What month? 0 points 0 points  What time? 0 points 0 points  Count back from 20 0 points 0 points  Months in reverse 2 points 0 points  Repeat phrase 2 points 0 points  Total Score 4 points 0 points    Immunizations Immunization History  Administered Date(s) Administered   Influenza Split 08/19/2011   Influenza Whole 01/03/2013   Influenza,inj,Quad PF,6+ Mos 08/20/2013, 09/15/2014, 08/26/2015, 08/01/2016, 08/22/2017, 10/15/2018, 09/24/2019, 12/07/2020, 02/05/2022   Influenza-Unspecified 08/03/2016   Moderna Sars-Covid-2 Vaccination 03/11/2020, 04/08/2020, 08/03/2020   PNEUMOCOCCAL CONJUGATE-20 02/05/2022   Pneumococcal Conjugate-13 02/24/2021   Pneumococcal Polysaccharide-23 08/19/2011, 12/12/2017   Tdap 06/28/2014    TDAP status: Up to date  Flu Vaccine status: Due, Education has been provided regarding the importance of this vaccine. Advised may receive this vaccine at local pharmacy or Health  Dept. Aware to provide a copy of the vaccination record if obtained from local pharmacy or Health Dept. Verbalized acceptance and understanding.  Pneumococcal vaccine status: Up to date  Covid-19 vaccine status: Completed vaccines  Qualifies for Shingles Vaccine? Yes   Zostavax completed No   Shingrix Completed?: No.    Education has been provided regarding the importance of this vaccine. Patient has been advised to call insurance company to determine out of pocket expense if they have not yet received this vaccine. Advised may also receive vaccine at local pharmacy or Health Dept. Verbalized acceptance and understanding.  Screening Tests Health Maintenance  Topic Date Due   Zoster Vaccines- Shingrix (1 of 2) Never done   COVID-19 Vaccine (4 - Moderna risk series) 09/28/2020   INFLUENZA VACCINE  07/03/2022   HEMOGLOBIN A1C  07/25/2022   PAP SMEAR-Modifier  08/05/2023 (Originally 08/21/2021)   FOOT EXAM  12/06/2022   OPHTHALMOLOGY EXAM  04/18/2023   MAMMOGRAM  01/23/2024   TETANUS/TDAP  06/28/2024   COLONOSCOPY (Pts 45-54yr Insurance coverage will need to be confirmed)  07/11/2031   Hepatitis C Screening  Completed   HIV Screening  Completed   HPV VACCINES  Aged Out    Health Maintenance  Health Maintenance Due  Topic Date Due   Zoster Vaccines- Shingrix (1 of 2) Never done   COVID-19 Vaccine (4 - Moderna risk series) 09/28/2020   INFLUENZA VACCINE  07/03/2022   HEMOGLOBIN A1C  07/25/2022    Colorectal cancer screening: Type of screening: Colonoscopy. Completed 07/10/2021. Repeat every 10 years  Mammogram status: Completed 01/22/2022. Repeat every year  Bone Density status:n/a  Lung Cancer Screening: (Low Dose CT Chest recommended if Age 64-80years, 30 pack-year currently smoking OR have quit w/in 15years.) does not qualify.   Lung Cancer Screening Referral: no  Additional Screening:  Hepatitis C Screening: does qualify; Completed 11/03/2015  Vision Screening:  Recommended annual ophthalmology exams for early detection of glaucoma and other disorders of the eye. Is the patient up to date with their annual eye exam?  Yes  Who is the provider or what is the name of the office in which the patient attends annual eye exams? Dr. GKaty FitchIf pt is not established with a provider, would they like to be referred to a provider to establish care? No .   Dental Screening: Recommended annual dental exams for proper oral hygiene  Community Resource Referral / Chronic Care Management: CRR required this visit?  No   CCM required this visit?  No      Plan:     I have personally reviewed and noted the following in the patient's chart:   Medical and social history Use of alcohol, tobacco or illicit drugs  Current medications and supplements including opioid prescriptions. Patient is currently taking opioid prescriptions. Information provided to patient regarding non-opioid alternatives. Patient advised to discuss non-opioid treatment plan with their provider. Functional ability and status Nutritional status Physical activity  Advanced directives List of other physicians Hospitalizations, surgeries, and ER visits in previous 12 months Vitals Screenings to include cognitive, depression, and falls Referrals and appointments  In addition, I have reviewed and discussed with patient certain preventive protocols, quality metrics, and best practice recommendations. A written personalized care plan for preventive services as well as general preventive health recommendations were provided to patient.     Kellie Simmering, LPN   03/09/1854   Nurse Notes: none

## 2022-08-08 ENCOUNTER — Telehealth: Payer: Self-pay

## 2022-08-08 NOTE — Telephone Encounter (Signed)
   Telephone encounter was:  Unsuccessful.  08/08/2022 Name: Shelby Anderson MRN: [id number] DOB: Apr 21, 1956  Unsuccessful outbound call made today to assist with:  Food Insecurity and therapist  Outreach Attempt:  1st Attempt  Unable to [place], [facility]  [phone] [address], [place], [place] [address] Phone: [phone] Email: [email or web address]

## 2022-08-10 ENCOUNTER — Telehealth: Payer: Self-pay

## 2022-08-10 NOTE — Telephone Encounter (Signed)
   Telephone encounter was:  Unsuccessful.  08/10/2022 Name: Shelby Anderson MRN: [id number] DOB: June 11, 1956  Unsuccessful outbound call made today to assist with:  Food Insecurity  Outreach Attempt:  2nd Attempt  A HIPAA compliant voice message was left requesting a return call.  Instructed patient to call back at  earliest [place], [facility], Care Management  [phone] [address], [place], [place] [address] Phone: [phone] Email: [email or web address]

## 2022-08-13 ENCOUNTER — Telehealth: Payer: Self-pay

## 2022-08-13 NOTE — Telephone Encounter (Signed)
   Telephone encounter was:  Unsuccessful.  08/13/2022 Name: Shelby Anderson MRN: [id number] DOB: 09/14/1956  Unsuccessful outbound call made today to assist with:  Food Insecurity  Outreach Attempt:  3rd Attempt.  Referral closed unable to contact patient.  A HIPAA compliant voice message was left requesting a return call.  Instructed patient to call back at earliest convenience .    [place], Care Management  [phone] [address], [place], [place] [address] Phone: [phone] Email: [email or web address]

## 2022-08-21 ENCOUNTER — Other Ambulatory Visit: Payer: Self-pay | Admitting: Family Medicine

## 2022-08-21 ENCOUNTER — Telehealth: Payer: Self-pay | Admitting: Pharmacist

## 2022-08-21 DIAGNOSIS — E119 Type 2 diabetes mellitus without complications: Secondary | ICD-10-CM

## 2022-08-21 MED ORDER — DAPAGLIFLOZIN PROPANEDIOL 10 MG PO TABS: 10.0000 mg | ORAL_TABLET | Freq: Every day | ORAL | 3 refills | Status: DC

## 2022-08-21 NOTE — Telephone Encounter (Signed)
Rx sent 

## 2022-08-21 NOTE — Telephone Encounter (Signed)
Called patient about the switch from [place] to [place]. Patient already picked up the Jardiance samples yesterday and will continue to use until she runs out and then will switch to the [place]. Patient was approved for patient assistance for the Farxiga through [employer].

## 2022-08-21 NOTE — Telephone Encounter (Signed)
Patient called as she still cannot afford Jardiance despite being approved for partial LIS. Copay for Jardiance in the donut hole is still $90 and patient cannot afford this. Set aside 1 months worth of samples. Will consider applying for Farxiga patient assistance as patient already qualified for [place] with Symbicort. Will discuss with PCP.

## 2022-08-21 NOTE — Telephone Encounter (Signed)
Yes that would be great 10 mg once daily

## 2022-08-22 ENCOUNTER — Other Ambulatory Visit: Payer: Self-pay

## 2022-08-22 MED ORDER — EMPAGLIFLOZIN 25 MG PO TABS: 25.0000 mg | ORAL_TABLET | Freq: Every day | ORAL | 0 refills | Status: DC

## 2022-08-30 ENCOUNTER — Other Ambulatory Visit: Payer: Self-pay | Admitting: *Deleted

## 2022-08-30 ENCOUNTER — Telehealth: Payer: Self-pay | Admitting: Pharmacist

## 2022-08-30 NOTE — Chronic Care Management (AMB) (Signed)
  Chronic Care Management Pharmacy Assistant   Name: Shelby Anderson  MRN: [id number] DOB: 02/05/1956  Reason for Encounter: Medication Review / Medication Coordination Call   Recent office visits:  08/07/2022 Shelby Allen LPN - Patient was seen for  Medicare annual wellness exam.   Recent consult visits:  None  Hospital visits:  None  Medications: Outpatient Encounter Medications as of 08/30/2022  Medication Sig   albuterol (PROVENTIL) (2.5 MG/3ML) 0.083% nebulizer solution Take 3 mLs (2.5 mg total) by nebulization every 6 (six) hours as needed for wheezing or shortness of breath (J45.40).   albuterol (VENTOLIN HFA) 108 (90 Base) MCG/ACT inhaler TAKE 2 PUFFS BY MOUTH EVERY 6 HOURS AS NEEDED FOR WHEEZE OR SHORTNESS OF BREATH   aspirin EC 81 MG tablet Take 81 mg at bedtime by mouth.    atorvastatin (LIPITOR) 80 MG tablet Take 1 tablet (80 mg total) by mouth daily.   Blood Glucose Monitoring Suppl (ONE TOUCH ULTRA 2) w/Device KIT Use as directed   budesonide-formoterol (SYMBICORT) 80-4.5 MCG/ACT inhaler INHALE 2 PUFFS INTO THE LUNGS EVERY MORNING AND ANOTHER 2 PUFFS 12 HOURS LATER   budesonide-formoterol (SYMBICORT) 80-4.5 MCG/ACT inhaler Inhale 2 puffs into the lungs every morning and another 2 puffs 12 hours later.   buPROPion (WELLBUTRIN XL) 150 MG 24 hr tablet Take 1 tablet by mouth daily.   Calcium Carbonate-Vitamin D 600-200 MG-UNIT TABS Take 1 tablet by mouth daily.   CINNAMON PO Take 1,000 mg 2 (two) times daily by mouth.   dapagliflozin propanediol (FARXIGA) 10 MG TABS tablet Take 1 tablet (10 mg total) by mouth daily before breakfast.   doxepin (SINEQUAN) 10 MG capsule Take by mouth.   empagliflozin (JARDIANCE) 25 MG TABS tablet Take 1 tablet (25 mg total) by mouth daily before breakfast.   esomeprazole (NEXIUM) 40 MG capsule Take 1 capsule (40 mg total) by mouth 2 (two) times daily.   famotidine (PEPCID) 20 MG tablet Take 1 tablet (20 mg total) by mouth 2 (two) times  daily as needed for heartburn or indigestion.   fluticasone (FLONASE) 50 MCG/ACT nasal spray Place into the nose.   furosemide (LASIX) 20 MG tablet TAKE 1 OR 2 TABLETS BY MOUTH EVERY DAY AS DIRECTED   glipiZIDE (GLUCOTROL XL) 5 MG 24 hr tablet TAKE 1 TABLET BY MOUTH EVERYDAY WITH BREAKFAST.   hydrALAZINE (APRESOLINE) 25 MG tablet Take 1 tablet (25 mg total) by mouth daily as needed (for BP >140/90 mmHg, up to three times daily).   hydrocortisone 2.5 % cream Apply topically as needed.   isosorbide mononitrate (IMDUR) 60 MG 24 hr tablet Take 1 tablet (60 mg total) by mouth daily.   L-Methylfolate (DEPLIN) 7.5 MG TABS Take 7.5 mg by mouth daily with breakfast.   Lancets (ONETOUCH DELICA PLUS LANCET30G) MISC USE TO CHECK BLOOD SUGAR 2 TIMES A DAY   Lancets (ONETOUCH DELICA PLUS LANCET30G) MISC USE AS DIRECTED   levothyroxine (SYNTHROID) 75 MCG tablet Take 1 tablet (75 mcg total) by mouth daily before breakfast.   lisinopril (ZESTRIL) 5 MG tablet Take 1 tablet (5 mg total) by mouth daily.   LYRICA 150 MG capsule Take 150 mg by mouth 3 (three) times daily.    metFORMIN (GLUCOPHAGE) 500 MG tablet Take 1 tablet (500 mg total) by mouth 2 (two) times daily with a meal.   metoprolol succinate (TOPROL-XL) 25 MG 24 hr tablet Take 1 tablet (25 mg total) by mouth daily. TAKE WITH OR IMMEDIATELY FOLLOWING A MEAL.     Misc Natural Products (GLUCOS-CHONDROIT-MSM COMPLEX PO) Take 2 tablets by mouth daily.   mometasone (ELOCON) 0.1 % cream Apply topically.   montelukast (SINGULAIR) 10 MG tablet Take 1 tablet (10 mg total) by mouth at bedtime.   Multiple Vitamin (MULITIVITAMIN WITH MINERALS) TABS Take 1 tablet by mouth daily.   mupirocin cream (BACTROBAN) 2 % Apply once daily to lesions on feet as needed.   nitroGLYCERIN (NITROSTAT) 0.4 MG SL tablet Place 1 tablet (0.4 mg total) under the tongue every 5 (five) minutes as needed for chest pain.   NURTEC 75 MG TBDP    ondansetron (ZOFRAN) 4 MG tablet Take 1 tablet (4  mg total) by mouth every 8 (eight) hours as needed for nausea or vomiting.   ONETOUCH ULTRA test strip USE TO CHECK BLOOD SUGAR 2 TIMES A DAY   oxyCODONE (ROXICODONE) 15 MG immediate release tablet Take 15 mg by mouth 5 (five) times daily.   oxyCODONE (ROXICODONE) 15 MG immediate release tablet Take 1 tablet by mouth four times a day   potassium chloride (KLOR-CON) 10 MEQ tablet Take 2 tablets (20 mEq total) by mouth daily.   promethazine (PHENERGAN) 25 MG tablet TAKE 1 TABLET BY MOUTH EVERYDAY AT BEDTIME   tiZANidine (ZANAFLEX) 4 MG tablet Take 4 mg by mouth every 8 (eight) hours as needed for muscle spasms.   vitamin B-12 (CYANOCOBALAMIN) 500 MCG tablet Take 500 mcg by mouth daily.    vitamin E 200 UNIT capsule Take 200 Units by mouth daily.   No facility-administered encounter medications on file as of 08/30/2022.   Reviewed chart for medication changes ahead of medication coordination call.  BP Readings from Last 3 Encounters:  08/07/22 100/60  06/19/22 106/63  06/12/22 100/60    Lab Results  Component Value Date   HGBA1C 7.5 (H) 01/25/2022     Patient obtains medications through Adherence Packaging  30 Days    Last adherence delivery included:  Levothyroxine 75 mcg - 1 tablets before breakfast  Tizanidine 4 mg - 2 tablets at breakfast, lunch and dinner (verified with patient she states she is taking 2 tabs tid) L-methylfolate 7.5 mg - 1 tablet at breakfast Lyrica 150 mg - 1 tablet at breakfast, lunch and dinner Phenergan 25 mg - 1 tablet at bedtime Montelukast 10 mg - 1 tablet at bedtime Potassium 10 meq - 2 tablets at breakfast Metformin 500 mg - 1 tablet at breakfast and dinner Wellbutrin XL 150 mg - 1 tablet at breakfast Isosorbide 60 mg - 1 tablet at breakfast Metoprolol 25 mg  - 1 tablet at breakfast Atorvastatin 80 mg - 1 tablet at bedtime Lisinopril 5 mg - 1 tablet at breakfast Glipizide 5 mg - 1 tablet at breakfast Pepcid 20 mg - 1 tablet at breakfast and  dinner Doxepine 10 mg - 3 tablets at dinner    Patient declined last month:   Nexium 40 mg - she is filling this at [facility] due to cost   Patient is due for next adherence delivery on: 09/12/2022   Called patient and reviewed medications and coordinated delivery.   This delivery to include: Levothyroxine 75 mcg - 1 tablets before breakfast  Tizanidine 4 mg - 2 tablets at breakfast, lunch and dinner  L-methylfolate 7.5 mg - 1 tablet at breakfast Lyrica 150 mg - 1 tablet at breakfast, lunch and dinner Phenergan 25 mg - 1 tablet at bedtime Montelukast 10 mg - 1 tablet at bedtime Potassium 10 meq - 2 tablets at breakfast  Metformin 500 mg - 1 tablet at breakfast and dinner Wellbutrin XL 150 mg - 1 tablet at breakfast Isosorbide 60 mg - 1 tablet at breakfast Metoprolol 25 mg  - 1 tablet at breakfast Atorvastatin 80 mg - 1 tablet at bedtime Lisinopril 5 mg - 1 tablet at breakfast Glipizide 5 mg - 1 tablet at breakfast Pepcid 20 mg - 1 tablet at breakfast and dinner Doxepine 10 mg - 3 tablets at dinner    Patient will need a short fill: No short fill needed   Coordinated acute fill: No acute fill needed   Patient declined the following medications: No medications declined  Confirmed delivery date of 09/12/2022, advised patient that pharmacy will contact them the morning of delivery.   Care Gaps: AWV - scheduled 08/12/2023 Last BP - 100/60/ on 08/07/2022 Last A1C - 7.5 on 01/25/2022 Shingrix - never done Covid booster - overdue Flu - due HGA1C - overdue Papsmear - postponed  Star Rating Drugs: Atorvastatin 80 mg - last filled 08/09/2022 30 DS at [facility] Farxiga 10 mg  - no fill history Jardiance 25 mg - last filled 08/20/2022 90 DS [facility] Glipizide 5 mg - last filled 08/09/2022 30 DS at [facility] Lisinopril 5 mg - last filled 08/09/2022 30 DS at [facility] Metformin 500 mg - last filled 08/09/2022 30 DS at [facility]  Janie Johnson CMA  Clinical Pharmacist Assistant [phone]  

## 2022-08-31 MED ORDER — POTASSIUM CHLORIDE ER 10 MEQ PO TBCR: 20.0000 meq | EXTENDED_RELEASE_TABLET | Freq: Every day | ORAL | 0 refills | Status: DC

## 2022-09-06 ENCOUNTER — Ambulatory Visit (INDEPENDENT_AMBULATORY_CARE_PROVIDER_SITE_OTHER): Payer: Medicare Other | Admitting: Family Medicine

## 2022-09-06 ENCOUNTER — Encounter: Payer: Self-pay | Admitting: Family Medicine

## 2022-09-06 VITALS — BP 100/62 | HR 66 | Temp 98.5°F | Resp 18 | Ht 65.0 in | Wt 215.8 lb

## 2022-09-06 DIAGNOSIS — E119 Type 2 diabetes mellitus without complications: Secondary | ICD-10-CM

## 2022-09-06 DIAGNOSIS — I10 Essential (primary) hypertension: Secondary | ICD-10-CM

## 2022-09-06 DIAGNOSIS — E039 Hypothyroidism, unspecified: Secondary | ICD-10-CM

## 2022-09-06 DIAGNOSIS — Z23 Encounter for immunization: Secondary | ICD-10-CM

## 2022-09-06 LAB — POCT GLYCOSYLATED HEMOGLOBIN (HGB A1C): Hemoglobin A1C: 6.5 % — AB (ref 4.0–5.6)

## 2022-09-06 MED ORDER — LEVOTHYROXINE SODIUM 75 MCG PO TABS: 75.0000 ug | ORAL_TABLET | Freq: Every day | ORAL | 3 refills | Status: DC

## 2022-09-06 NOTE — Progress Notes (Signed)
Established Patient Office Visit  Subjective   Patient ID: Shelby Anderson, female    DOB: 1956/08/07  Age: 66 y.o. MRN: [id number]  Chief Complaint  Patient presents with   Establish Care    Patient is here for transition of care visit. Patient reports no new symptoms, no new issues to report.  HTN -- BP in office performed and is well controlled. She  reports no side effects to the medications, no chest pain, SOB, dizziness or headaches. She has a BP cuff at home and is checking BP regularly, reports they are in the normal range.   Diabetes- Pt reports compliance with medication. She reports a history of diabetic neuropathy and she sees Dr. Hardin Negus for pain management. She is UTD on her foot exam and eye exam, we reviewed her last set of labs. She is currently on: Farxiga 10 mg daily (formerly on jardiance but this is not covered) and glipizide 5 mg daily.  Hypthyroid-- on 75 mcg daily of levothyroxine, she denies any weight gain, hair loss, or constipation.   I have reviewed her other chronic medical problems as well as her medications. She sees a pain mgt physician, podiatrist, pulmonary specialist, cardiologist, neurologist recently in the past year for follow up of her multiple medical problems. She is UTD on her health maintenance measures also.   Current Outpatient Medications  Medication Instructions   albuterol (PROVENTIL) 2.5 mg, Nebulization, Every 6 hours PRN   albuterol (VENTOLIN HFA) 108 (90 Base) MCG/ACT inhaler TAKE 2 PUFFS BY MOUTH EVERY 6 HOURS AS NEEDED FOR WHEEZE OR SHORTNESS OF BREATH   aspirin EC 81 mg, Oral, Daily at bedtime   atorvastatin (LIPITOR) 80 mg, Oral, Daily   Blood Glucose Monitoring Suppl (ONE TOUCH ULTRA 2) w/Device KIT Use as directed   budesonide-formoterol (SYMBICORT) 80-4.5 MCG/ACT inhaler INHALE 2 PUFFS INTO THE LUNGS EVERY MORNING AND ANOTHER 2 PUFFS 12 HOURS LATER   budesonide-formoterol (SYMBICORT) 80-4.5 MCG/ACT inhaler Inhale 2  puffs into the lungs every morning and another 2 puffs 12 hours later.   buPROPion (WELLBUTRIN XL) 150 MG 24 hr tablet 1 tablet, Oral, Daily   Calcium Carbonate-Vitamin D 600-200 MG-UNIT TABS 1 tablet, Oral, Daily   CINNAMON PO 1,000 mg, Oral, 2 times daily   dapagliflozin propanediol (FARXIGA) 10 mg, Oral, Daily before breakfast   [place]plin 7.5 mg, Oral, Daily with breakfast,     doxepin (SINEQUAN) 10 MG capsule Oral   esomeprazole (NEXIUM) 40 mg, Oral, 2 times daily   famotidine (PEPCID) 20 mg, Oral, 2 times daily PRN   fluticasone (FLONASE) 50 MCG/ACT nasal spray Nasal   furosemide (LASIX) 20 MG tablet TAKE 1 OR 2 TABLETS BY MOUTH EVERY DAY AS DIRECTED   glipiZIDE (GLUCOTROL XL) 5 MG 24 hr tablet TAKE 1 TABLET BY MOUTH EVERYDAY WITH BREAKFAST.   hydrALAZINE (APRESOLINE) 25 mg, Oral, Daily PRN   hydrocortisone 2.5 % cream Topical, As needed   isosorbide mononitrate (IMDUR) 60 mg, Oral, Daily   Lancets (ONETOUCH DELICA PLUS DQQIWL79G) MISC USE TO CHECK BLOOD SUGAR 2 TIMES A DAY   Lancets (ONETOUCH DELICA PLUS [id number]) MISC USE AS DIRECTED   levothyroxine (SYNTHROID) 75 mcg, Oral, Daily before breakfast   lisinopril (ZESTRIL) 5 mg, Oral, Daily   Lyrica 150 mg, Oral, 3 times daily   metFORMIN (GLUCOPHAGE) 500 mg, Oral, 2 times daily with meals   metoprolol succinate (TOPROL-XL) 25 mg, Oral, Daily, TAKE WITH OR IMMEDIATELY FOLLOWING A MEAL.   Misc Natural Products (  GLUCOS-CHONDROIT-MSM COMPLEX PO) 2 tablets, Oral, Daily   mometasone (ELOCON) 0.1 % cream Topical   montelukast (SINGULAIR) 10 mg, Oral, Daily at bedtime   Multiple Vitamin (MULITIVITAMIN WITH MINERALS) TABS 1 tablet, Oral, Daily,     mupirocin cream (BACTROBAN) 2 % Apply once daily to lesions on feet as needed.   nitroGLYCERIN (NITROSTAT) 0.4 mg, Sublingual, Every 5 min PRN   NURTEC 75 MG TBDP No dose, route, or frequency recorded.   ondansetron (ZOFRAN) 4 mg, Oral, Every 8 hours PRN   ONETOUCH ULTRA test strip USE TO CHECK  BLOOD SUGAR 2 TIMES A DAY   oxyCODONE (ROXICODONE) 15 MG immediate release tablet Take 1 tablet by mouth four times a day   oxyCODONE (ROXICODONE) 15 mg, Oral, 5 times daily   potassium chloride (KLOR-CON) 10 MEQ tablet 20 mEq, Oral, Daily   promethazine (PHENERGAN) 25 MG tablet TAKE 1 TABLET BY MOUTH EVERYDAY AT BEDTIME   tiZANidine (ZANAFLEX) 4 mg, Oral, Every 8 hours PRN   vitamin B-12 (CYANOCOBALAMIN) 500 mcg, Oral, Daily   vitamin E 200 Units, Oral, Daily    Patient Active Problem List   Diagnosis Date Noted   Hypothyroidism 09/11/2022   Dental caries 06/12/2022   Spondylosis without myelopathy or radiculopathy, cervical region 04/21/2021   Neutropenia with fever ([place]) 10/01/2019   Genetic testing 09/17/2019   Family history of melanoma    Family history of pancreatic cancer    Malignant neoplasm of upper-outer quadrant of left breast in female, estrogen receptor negative ([place]) 09/07/2019   Encounter for loop recorder at end of battery life 04/24/2018   Esophageal stricture 07/01/2017   Hyperlipidemia associated with type 2 diabetes mellitus ([place]) 05/20/2017   Allergic rhinitis 03/11/2017   Dilated cardiomyopathy ([place]) 02/07/2017   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 11/13/2016   Morbid obesity due to excess calories ([name]) 09/26/2016   Increased endometrial stripe thickness 06/27/2016   Intramural leiomyoma of uterus 06/27/2016   Type 2 diabetes mellitus without complication, without long-term current use of insulin ([place]) 12/06/2015   HTN (hypertension), benign 07/19/2015   Arrhythmia 07/19/2015   Orthostatic hypotension 07/19/2015   GERD (gastroesophageal reflux disease) 07/19/2015   Chronic back pain 07/19/2015   Neuropathy 07/19/2015   Symptomatic PVCs 11/02/2014   Syncope 11/02/2014   Chest pain 12/23/2013   Disorder of cervix 03/10/2013   Vaginal atrophy 03/10/2013   OSA (obstructive sleep apnea) 07/31/2012   Asthma 04/29/2012   Coronary artery disease  11/20/2011      Review of Systems  All other systems reviewed and are negative.     Objective:     BP 100/62 (BP Location: Left Arm, Patient Position: Sitting, Cuff Size: Large)   Pulse 66   Temp 98.5 F (36.9 C) (Oral)   Resp 18   Ht '5\' 5"'  (1.651 m)   Wt 215 lb 12.8 oz (97.9 kg)   SpO2 97%   BMI 35.91 kg/m  BP Readings from Last 3 Encounters:  09/06/22 100/62  08/07/22 100/60  06/19/22 106/63   Wt Readings from Last 3 Encounters:  09/06/22 215 lb 12.8 oz (97.9 kg)  08/07/22 217 lb 9.6 oz (98.7 kg)  06/12/22 216 lb 9.6 oz (98.2 kg)      Physical Exam Vitals reviewed.  Constitutional:      Appearance: Normal appearance. She is well-groomed. She is obese.  Eyes:     Conjunctiva/sclera: Conjunctivae normal.  Cardiovascular:     Rate and Rhythm: Normal rate and regular  rhythm.     Pulses: Normal pulses.     Heart sounds: S1 normal and S2 normal.  Pulmonary:     Effort: Pulmonary effort is normal.     Breath sounds: Normal breath sounds and air entry.  Abdominal:     General: Bowel sounds are normal.     Palpations: Abdomen is soft.  Musculoskeletal:        General: Normal range of motion.     Cervical back: Normal range of motion and neck supple.     Right lower leg: No edema.     Left lower leg: No edema.  Skin:    General: Skin is warm and dry.  Neurological:     Mental Status: She is alert and oriented to person, place, and time. Mental status is at baseline.     Gait: Gait is intact.  Psychiatric:        Mood and Affect: Mood and affect normal.        Speech: Speech normal.        Behavior: Behavior normal.        Judgment: Judgment normal.      Results for orders placed or performed in visit on 09/06/22  POC HgB A1c  Result Value Ref Range   Hemoglobin A1C 6.5 (A) 4.0 - 5.6 %   HbA1c POC (<> result, manual entry)     HbA1c, POC (prediabetic range)     HbA1c, POC (controlled diabetic range)      Last metabolic panel Lab Results   Component Value Date   GLUCOSE 135 (H) 01/25/2022   NA 140 01/25/2022   K 4.1 01/25/2022   CL 101 01/25/2022   CO2 31 01/25/2022   BUN 16 01/25/2022   CREATININE 0.79 01/25/2022   GFRNONAA >60 12/08/2019   CALCIUM 9.5 01/25/2022   PHOS 4.4 10/31/2019   PROT 7.3 01/25/2022   ALBUMIN 4.2 01/25/2022   BILITOT 0.5 01/25/2022   ALKPHOS 76 01/25/2022   AST 25 01/25/2022   ALT 30 01/25/2022   ANIONGAP 14 12/08/2019   Last lipids Lab Results  Component Value Date   CHOL 127 01/25/2022   HDL 35.80 (L) 01/25/2022   LDLCALC 62 01/25/2022   TRIG 146.0 01/25/2022   CHOLHDL 4 01/25/2022    The ASCVD Risk score (Arnett DK, et al., 2019) failed to calculate for the following reasons:   The patient has a prior MI or stroke diagnosis    Assessment & Plan:   Problem List Items Addressed This Visit       Cardiovascular and Mediastinum   HTN (hypertension), benign (Chronic)    Current hypertension medications:       Sig   furosemide (LASIX) 20 MG tablet (Taking) TAKE 1 OR 2 TABLETS BY MOUTH EVERY DAY AS DIRECTED   lisinopril (ZESTRIL) 5 MG tablet (Taking) Take 1 tablet (5 mg total) by mouth daily.   metoprolol succinate (TOPROL-XL) 25 MG 24 hr tablet (Taking) Take 1 tablet (25 mg total) by mouth daily. TAKE WITH OR IMMEDIATELY FOLLOWING A MEAL.   hydrALAZINE (APRESOLINE) 25 MG tablet Take 1 tablet (25 mg total) by mouth daily as needed (for BP >140/90 mmHg, up to three times daily).  BP performed today in office, well controlled, will continue the above listed medications as prescribed.        Endocrine   Type 2 diabetes mellitus without complication, without long-term current use of insulin (HCC) - Primary (Chronic)    A1C performed in  office today, 6.5 which is well controlled.       Relevant Orders   POC HgB A1c (Completed)   CMP   Hemoglobin A1c   Lipid Panel   Hypothyroidism (Chronic)    Patient is currently on levothyroxine 75 mcg daily, last TSH was reviewed, will  continue the current dose and recheck her TSH in [place] before her next visit.       Relevant Medications   levothyroxine (SYNTHROID) 75 MCG tablet   Other Visit Diagnoses     Hypothyroidism (acquired)       Relevant Medications   levothyroxine (SYNTHROID) 75 MCG tablet   Other Relevant Orders   TSH   Need for immunization against influenza       Relevant Orders   Flu Vaccine QUAD 6+ mos PF IM (Fluarix Quad PF) (Completed)      42 minutes spent with the patient today reviewing her extensive medical history, reviewing medications and other specialists notes. Return in about 5 months (around 01/28/2023).    Farrel Conners, MD

## 2022-09-07 NOTE — Assessment & Plan Note (Signed)
A1C performed in office today, 6.5 which is well controlled.

## 2022-09-07 NOTE — Assessment & Plan Note (Signed)
Current hypertension medications:      Sig   furosemide (LASIX) 20 MG tablet (Taking) TAKE 1 OR 2 TABLETS BY MOUTH EVERY DAY AS DIRECTED   lisinopril (ZESTRIL) 5 MG tablet (Taking) Take 1 tablet (5 mg total) by mouth daily.   metoprolol succinate (TOPROL-XL) 25 MG 24 hr tablet (Taking) Take 1 tablet (25 mg total) by mouth daily. TAKE WITH OR IMMEDIATELY FOLLOWING A MEAL.   hydrALAZINE (APRESOLINE) 25 MG tablet Take 1 tablet (25 mg total) by mouth daily as needed (for BP >140/90 mmHg, up to three times daily).     BP performed today in office, well controlled, will continue the above listed medications as prescribed.

## 2022-09-11 DIAGNOSIS — E039 Hypothyroidism, unspecified: Secondary | ICD-10-CM | POA: Insufficient documentation

## 2022-09-11 NOTE — Assessment & Plan Note (Signed)
Patient is currently on levothyroxine 75 mcg daily, last TSH was reviewed, will continue the current dose and recheck her TSH in [place] before her next visit.

## 2022-09-18 ENCOUNTER — Ambulatory Visit (INDEPENDENT_AMBULATORY_CARE_PROVIDER_SITE_OTHER): Payer: Medicare Other | Admitting: Podiatry

## 2022-09-18 DIAGNOSIS — M79674 Pain in right toe(s): Secondary | ICD-10-CM | POA: Diagnosis not present

## 2022-09-18 DIAGNOSIS — B351 Tinea unguium: Secondary | ICD-10-CM

## 2022-09-18 DIAGNOSIS — M79675 Pain in left toe(s): Secondary | ICD-10-CM

## 2022-09-18 DIAGNOSIS — L84 Corns and callosities: Secondary | ICD-10-CM

## 2022-09-18 DIAGNOSIS — E1151 Type 2 diabetes mellitus with diabetic peripheral angiopathy without gangrene: Secondary | ICD-10-CM | POA: Diagnosis not present

## 2022-09-18 NOTE — Progress Notes (Signed)
  Subjective:  Patient ID: Shelby Anderson, female    DOB: 01/09/1956,  MRN: [id number]  Shelby Anderson presents to clinic today for:  Chief Complaint  Patient presents with   Nail Problem    Diabetic foot care [id number] A1C-6.4 Shelby Anderson PCP VST-09/2022   New problem(s): None.   PCP is Shelby Conners, Shelby Anderson.  Allergies  Allergen Reactions   Lodine [Etodolac] Anaphylaxis, Hives and Swelling   Oxycodone Anaphylaxis    Other reaction(s): Other (See Comments) UNKNOWN  hives, trouble breathing, tongue swelling (Only Oxycontin) Tolerates plain oxycodone.   Oxycontin [Oxycodone Hcl] Anaphylaxis    hives, trouble breathing, tongue swelling (Only Oxycontin) Tolerates plain oxycodone.   Penicillins Anaphylaxis    Told by a surgeon never to take it again. Has patient had a PCN reaction causing immediate rash, facial/tongue/throat swelling, SOB or lightheadedness with hypotension: Yes Has patient had a PCN reaction causing severe rash involving mucus membranes or skin necrosis: Unknown Has patient had a PCN reaction that required hospitalization: No Has patient had a PCN reaction occurring within the last 10 years: No If all of the above answers are "NO", then may proceed with Cephalosporin use.   Aspirin Other (See Comments)    High-dose caused GI Bleeds   Darvocet [Propoxyphene N-Acetaminophen] Hives   Nitroglycerin Other (See Comments)    IV-BP drops dramatically Can take po   Propoxyphene Hives   Tramadol Hives and Itching   Ultram [Tramadol Hcl] Hives   Valium Other (See Comments)    Circulation problems. "Legs turned black".    Review of Systems: Negative except as noted in the HPI.  Objective:   Shelby Anderson is a pleasant 66 y.o. female in NAD. AAO x 3.  Vascular Examination: CFT <3 seconds b/l LE. Faintly palpable DP pulses b/l. Nonpalpable PT pulses b/l. Pedal hair absent b/l. Skin temperature gradient WNL b/l. No pain with calf  compression b/l. No edema b/l LE. No cyanosis or clubbing noted b/l LE.  Dermatological Examination: Pedal integument with normal turgor, texture and tone b/l LE. No open wounds b/l. No interdigital macerations b/l. Toenails 2-5 bilaterally elongated, thickened, discolored with subungual debris. +Tenderness with dorsal palpation of nailplates. Anonychia noted bilateral great toes. Nailbed(s) epithelialized.    Hyperkeratotic lesion(s) submet head 5 b/l and submet head 1 left foot.  No erythema, no edema, no drainage, no fluctuance.   Musculoskeletal Examination: Normal muscle strength 5/5 to all lower extremity muscle groups bilaterally. No pain, crepitus or joint limitation noted with ROM b/l LE. No gross bony pedal deformities b/l. Patient ambulates independently without assistive aids.  Neurological Examination: Pt has subjective symptoms of neuropathy. Protective sensation intact 5/5 intact bilaterally with 10g monofilament b/l.  Assessment/Plan: 1. Pain due to onychomycosis of toenails of both feet   2. Callus   3. Diabetes mellitus type 2 with peripheral artery disease (HCC)     No orders of the defined types were placed in this encounter.   -Patient was evaluated and treated. All patient's and/or POA's questions/concerns answered on today's visit. -Toenails 1-5 bilaterally debrided in length and girth without iatrogenic bleeding with sterile nail nipper and dremel.  -Callus(es) submet head 1 left foot and submet head 5 b/l pared utilizing sterile scalpel blade without complication or incident. Total number debrided =3. -Patient/POA to call should there be question/concern in the interim.   Return in about 3 months (around 12/19/2022).  Marzetta Board, DPM

## 2022-09-22 ENCOUNTER — Encounter: Payer: Self-pay | Admitting: Podiatry

## 2022-09-25 ENCOUNTER — Telehealth: Payer: Self-pay | Admitting: Pharmacist

## 2022-09-25 NOTE — Chronic Care Management (AMB) (Signed)
    Chronic Care Management Pharmacy Assistant   Name: Shelby Anderson  MRN: [id number] DOB: September 13, 1956  09/26/2022 APPOINTMENT REMINDER  Called [name], No answer, left message of appointment on 09/26/2022 at 1:00 via office visit with [name], Pharm D. Notified to have all medications, supplements, blood pressure and/or blood sugar logs available during appointment and to return call if need to reschedule.  Care Gaps: AWV - scheduled 08/12/2023 Last BP - 100/62 on 09/06/2022 Last A1C - 6.5 on 09/06/2022 AIC - never done Covid - overdue Pap Smear - postponed  Star Rating Drug: Atorvastatin 80 mg - last filled 09/07/2022 [facility] Glipizide 5 mg - last filled 09/07/2022 30 [facility] Lisinopril 5 mg - last filled 09/07/2022 30 [facility] at [facility] Metformin 500 mg - last filled 09/07/2022 30 [facility] at [facility] Farxiga 10 mg  - no fill history   Any gaps in medications fill history? No  [name]  Catering manager [phone]

## 2022-09-26 ENCOUNTER — Ambulatory Visit: Payer: Medicare Other

## 2022-09-26 DIAGNOSIS — Z79891 Long term (current) use of opiate analgesic: Secondary | ICD-10-CM | POA: Diagnosis not present

## 2022-09-26 DIAGNOSIS — G894 Chronic pain syndrome: Secondary | ICD-10-CM | POA: Diagnosis not present

## 2022-09-26 DIAGNOSIS — G893 Neoplasm related pain (acute) (chronic): Secondary | ICD-10-CM | POA: Diagnosis not present

## 2022-09-26 DIAGNOSIS — M47816 Spondylosis without myelopathy or radiculopathy, lumbar region: Secondary | ICD-10-CM | POA: Diagnosis not present

## 2022-09-26 NOTE — Progress Notes (Deleted)
Chronic Care Management Pharmacy Note  09/26/2022 Name:  Shelby Anderson MRN:  [id number] DOB:  27-Mar-1956  Summary: Pt has trouble affording her medications A1c not at goal < 7%  Recommendations/Changes made from today's visit: -Recommend checking expiration date on test strips -Recommend switching metformin and Jardiance to Synjardy to minimize pill burden -[facility] to [facility] for medications to be filled simultaneously -Recommended GI follow up for diarrhea and blood in stool -Adjusted medication schedule for morning meds  Plan: Apply for PAP for Symbicort and Jardiance BP and DM assessment in 2 months   Subjective: Shelby Anderson is an 66 y.o. year old female who is a primary patient of Legrand Como, Royston Cowper, MD.  The CCM team was consulted for assistance with disease management and care coordination needs.    Engaged with patient face to face for follow up visit in response to provider referral for pharmacy case management and/or care coordination services.   Consent to Services:  The patient was given information about Chronic Care Management services, agreed to services, and gave verbal consent prior to initiation of services.  Please see initial visit note for detailed documentation.   Patient Care Team: Farrel Conners, MD as PCP - General (Family Medicine) Rigoberto Noel, MD as Consulting Physician (Pulmonary Disease) Adrian Prows, MD as Consulting Physician (Cardiology) Lenon Oms, MD as Referring Physician (Obstetrics and Gynecology) Veneda Melter, MD as Referring Physician (Cardiology) [name], MD as Consulting Physician (Anesthesiology) Rolm Bookbinder, MD as Consulting Physician (General Surgery) Nicholas Lose, MD as Consulting Physician (Hematology and Oncology) Kyung Rudd, MD as Consulting Physician (Radiation Oncology) Alda Berthold, DO as Consulting Physician (Neurology) Jarome Matin, MD as Consulting  Physician (Dermatology) Viona Gilmore, [facility] as Pharmacist (Pharmacist)  Recent office visits: 09/06/22 Shelby Freshwater, MD: Patient presented for [facility] visit. A1c improved to 6.5%. Influenza vaccine administered. Follow up in 5 months.  08/07/2022 Shelby Durand LPN - Patient was seen for Medicare annual wellness exam.   05/11/2022 - Alysia Penna MD - Patient was seen for yeast vaginitis and an additional issue. Started Fluconazole 150 mg daily. No follow up noted.   Recent consult visits: 09/18/22 Acquanetta Sit DPM (Podiatry) -  seen for onychomycosis and routine foot care. No medication changes and follow up in 3 months.  06/13/2022 Acquanetta Sit DPM (podiatry) - Patient was seen for Pain due to onychomycosis of toenails of both feet and additional issues. Changed Bupropion to 150 mg 24 hr tab daily. Decreased Doxepin to 10 mg daily.Follow up in 3 months.    06/12/2022 Tammy Parrett NP (pulmonary) - Patient was seen for Mild persistent asthma without complication and additional issues. No medication changes. Follow up in 6 months.   05/17/2022 Rolm Bookbinder MD (general surgery) - Patient was seen for Malignant neoplasm of upper-outer quadrant of left breast in female, estrogen receptor negative. No medication changes. Follow up in 1 year.  04/12/2022 Gar Ponto PT - Patient was seen for PT treatment. Treatment plan 2 times per week for 2 weeks.   Hospital visits: Patient was seen at [facility] on 06/19/2022 (1 hr) due to right wrist pain, acute pain for right knee and left foot pain after a fall.    New?Medications Started at [facility] Discharge:?? No new medications Medication Changes at Hospital Discharge: No medication changes Medications Discontinued at Hospital Discharge: No medications discontinued Medications that remain the same after Hospital Discharge:??  -All other medications will remain the same.  Objective:  Lab Results  Component Value Date    CREATININE 0.79 01/25/2022   BUN 16 01/25/2022   GFR 79.36 01/25/2022   GFRNONAA >60 12/08/2019   GFRAA >60 12/08/2019   NA 140 01/25/2022   K 4.1 01/25/2022   CALCIUM 9.5 01/25/2022   CO2 31 01/25/2022   GLUCOSE 135 (H) 01/25/2022    Lab Results  Component Value Date/Time   HGBA1C 6.5 (A) 09/06/2022 04:10 PM   HGBA1C 7.5 (H) 01/25/2022 01:11 PM   HGBA1C 7.4 (H) 02/27/2021 01:00 PM   HGBA1C 5.9 01/12/2017 12:00 AM   GFR 79.36 01/25/2022 01:11 PM   GFR 94.01 02/27/2021 01:00 PM   MICROALBUR 7.7 (H) 01/25/2022 01:11 PM   MICROALBUR 1.1 02/27/2021 01:00 PM    Last diabetic Eye exam:  Lab Results  Component Value Date/Time   HMDIABEYEEXA No Retinopathy 04/17/2022 12:00 AM    Last diabetic Foot exam: No results found for: "HMDIABFOOTEX"   Lab Results  Component Value Date   CHOL 127 01/25/2022   HDL 35.80 (L) 01/25/2022   LDLCALC 62 01/25/2022   TRIG 146.0 01/25/2022   CHOLHDL 4 01/25/2022       Latest Ref Rng & Units 01/25/2022    1:11 PM 02/27/2021    1:00 PM 09/30/2020    3:33 PM  Hepatic Function  Total Protein 6.0 - 8.3 g/dL 7.3  6.5  7.5   Albumin 3.5 - 5.2 g/dL 4.2  3.7    AST 0 - 37 U/L '25  18  26   ' ALT 0 - 35 U/L '30  18  25   ' Alk Phosphatase 39 - 117 U/L 76  68    Total Bilirubin 0.2 - 1.2 mg/dL 0.5  0.4  0.5     Lab Results  Component Value Date/Time   TSH 1.63 04/02/2022 11:08 AM   TSH 9.47 (H) 01/25/2022 01:11 PM   FREET4 1.2 09/30/2020 03:33 PM   FREET4 0.81 04/10/2016 03:11 PM       Latest Ref Rng & Units 01/25/2022    1:11 PM 02/27/2021    1:00 PM 09/30/2020    3:33 PM  CBC  WBC 4.0 - 10.5 K/uL 8.0  5.5  5.5   Hemoglobin 12.0 - 15.0 g/dL 13.3  12.6  13.7   Hematocrit 36.0 - 46.0 % 40.3  38.1  42.8   Platelets 150.0 - 400.0 K/uL 271.0  222.0  271     Lab Results  Component Value Date/Time   VD25OH 57 09/30/2020 03:33 PM    Clinical ASCVD: Yes  The ASCVD Risk score (Arnett DK, et al., 2019) failed to calculate for the following  reasons:   The patient has a prior MI or stroke diagnosis       09/06/2022    4:07 PM 08/07/2022    3:25 PM 05/11/2022    1:22 PM  Depression screen PHQ 2/9  Decreased Interest '1 3 1  ' Down, Depressed, Hopeless '1 3 2  ' PHQ - 2 Score '2 6 3  ' Altered sleeping '1 1 1  ' Tired, decreased energy '2 3 1  ' Change in appetite '2 3 1  ' Feeling bad or failure about yourself  0 0 0  Trouble concentrating 0 0 0  Moving slowly or fidgety/restless 0 0 0  Suicidal thoughts 0 0 0  PHQ-9 Score '7 13 6  ' Difficult doing work/chores Somewhat difficult Somewhat difficult Somewhat difficult      Social History   Tobacco Use  Smoking  Status Never  Smokeless Tobacco Never   BP Readings from Last 3 Encounters:  09/06/22 100/62  08/07/22 100/60  06/19/22 106/63   Pulse Readings from Last 3 Encounters:  09/06/22 66  08/07/22 74  06/19/22 77   Wt Readings from Last 3 Encounters:  09/06/22 215 lb 12.8 oz (97.9 kg)  08/07/22 217 lb 9.6 oz (98.7 kg)  06/12/22 216 lb 9.6 oz (98.2 kg)   BMI Readings from Last 3 Encounters:  09/06/22 35.91 kg/m  08/07/22 36.21 kg/m  06/12/22 34.96 kg/m    Assessment/Interventions: Review of patient past medical history, allergies, medications, health status, including review of consultants reports, laboratory and other test data, was performed as part of comprehensive evaluation and provision of chronic care management services.   SDOH:  (Social Determinants of Health) assessments and interventions performed: Yes *** SDOH Interventions    Flowsheet Row Clinical Support from 08/07/2022 in [place] at [place] Management from 01/29/2022 in [place] at [place] Management from 07/17/2021 in [place] [facility] [place] Patient Outreach Telephone from 11/04/2019 in [place] Patient Outreach Telephone from 07/27/2019 in [place] Patient Outreach Telephone from 05/09/2018 in [place] Interventions Intervention Not Indicated Other (Comment)  Dossie Arbour guide referral for food resources] -- -- -- --  Housing Interventions -- Intervention Not Indicated -- -- -- --  Transportation Interventions Intervention Not Indicated Intervention Not Indicated Intervention Not Indicated -- -- --  Depression Interventions/Treatment  Currently on Treatment -- -- Medication, Currently on Treatment Counseling, Currently on Treatment Currently on Treatment  Financial Strain Interventions Other (Comment)  Dossie Arbour guide referral] Other (Comment)  Dossie Arbour guide referral] Other (Comment)  [working on patient assistance] -- -- --  Stress Interventions Intervention Not Indicated Intervention Not Indicated -- -- -- --       SDOH Screenings   Food Insecurity: No Food Insecurity (08/07/2022)  Housing: Low Risk  (01/29/2022)  Transportation Needs: No Transportation Needs (08/07/2022)  Depression (PHQ2-9): Medium Risk (09/06/2022)  Financial Resource Strain: Medium Risk (08/07/2022)  Physical Activity: Sufficiently Active (08/07/2022)  Social Connections: Moderately Isolated (07/18/2021)  Stress: No Stress Concern Present (08/07/2022)  Tobacco Use: Low Risk  (09/22/2022)    CCM Care Plan  Allergies  Allergen Reactions   Lodine [Etodolac] Anaphylaxis, Hives and Swelling   Oxycodone Anaphylaxis    Other reaction(s): Other (See Comments) UNKNOWN  hives, trouble breathing, tongue swelling (Only Oxycontin) Tolerates plain oxycodone.   Oxycontin [Oxycodone Hcl] Anaphylaxis    hives, trouble breathing, tongue swelling (Only Oxycontin) Tolerates plain oxycodone.   Penicillins Anaphylaxis    Told by a surgeon never to take it again. Has patient had a PCN reaction causing immediate rash, facial/tongue/throat swelling, SOB or lightheadedness with hypotension: Yes Has patient had a PCN reaction causing severe rash involving mucus membranes or skin necrosis: Unknown Has patient  had a PCN reaction that required hospitalization: No Has patient had a PCN reaction occurring within the last 10 years: No If all of the above answers are "NO", then may proceed with Cephalosporin use.   Aspirin Other (See Comments)    High-dose caused GI Bleeds   Darvocet [Propoxyphene N-Acetaminophen] Hives   Nitroglycerin Other (See Comments)    IV-BP drops dramatically Can take po   Propoxyphene Hives   Tramadol Hives and Itching   Ultram [Tramadol Hcl] Hives   Valium Other (See Comments)    Circulation problems. "  Legs turned black".    Medications Reviewed Today     Reviewed by Marzetta Board, DPM (Physician) on 09/22/22 at [place] List Status: <None>   Medication Order Taking? Sig Documenting Provider Last Dose Status Informant  albuterol (PROVENTIL) (2.5 MG/3ML) 0.083% nebulizer solution [id number] No Take 3 mLs (2.5 mg total) by nebulization every 6 (six) hours as needed for wheezing or shortness of breath (J45.40). Javier Glazier, MD Taking Active   albuterol (VENTOLIN HFA) 108 (90 Base) MCG/ACT inhaler [id number] No TAKE 2 PUFFS BY MOUTH EVERY 6 HOURS AS NEEDED FOR WHEEZE OR SHORTNESS OF BREATH Parrett, Tammy S, NP Taking Active   aspirin EC 81 MG tablet [id number] No Take 81 mg at bedtime by mouth.  [provider] Taking Active Self  atorvastatin (LIPITOR) 80 MG tablet [id number] No Take 1 tablet (80 mg total) by mouth daily. Farrel Conners, MD Taking Active   Blood Glucose Monitoring Suppl (ONE TOUCH ULTRA 2) w/Device Drucie Opitz [id number] No Use as directed Caren Macadam, MD Taking Active   budesonide-formoterol (SYMBICORT) 80-4.5 MCG/ACT inhaler [id number] No INHALE 2 PUFFS INTO THE LUNGS EVERY MORNING AND ANOTHER 2 PUFFS 12 HOURS LATER Parrett, Tammy S, NP Taking Active   budesonide-formoterol (SYMBICORT) 80-4.5 MCG/ACT inhaler [id number] No Inhale 2 puffs into the lungs every morning and another 2 puffs 12 hours later. Parrett, Fonnie Mu, NP Taking Active    buPROPion (WELLBUTRIN XL) 150 MG 24 hr tablet [id number] No Take 1 tablet by mouth daily. [provider] Taking Active   Calcium Carbonate-Vitamin D 600-200 MG-UNIT TABS [id number] No Take 1 tablet by mouth daily. [provider] Taking Active Self  CINNAMON PO [id number] No Take 1,000 mg 2 (two) times daily by mouth. [provider] Taking Active Self  dapagliflozin propanediol (FARXIGA) 10 MG TABS tablet [id number] No Take 1 tablet (10 mg total) by mouth daily before breakfast. Farrel Conners, MD Taking Active   doxepin (SINEQUAN) 10 MG capsule [id number] No Take by mouth. [provider] Taking Active   esomeprazole (NEXIUM) 40 MG capsule [id number] No Take 1 capsule (40 mg total) by mouth 2 (two) times daily. Irene Shipper, MD Taking Active   famotidine (PEPCID) 20 MG tablet [id number] No Take 1 tablet (20 mg total) by mouth 2 (two) times daily as needed for heartburn or indigestion. Irene Shipper, MD Taking Active   fluticasone [facility]) 50 MCG/ACT nasal spray [id number] No Place into the nose. [provider] Taking Active   furosemide (LASIX) 20 MG tablet [id number] No TAKE 1 OR 2 TABLETS BY MOUTH EVERY DAY AS DIRECTED Koberlein, Junell C, MD Taking Active   glipiZIDE (GLUCOTROL XL) 5 MG 24 hr tablet [id number] No TAKE 1 TABLET BY MOUTH EVERYDAY WITH BREAKFAST. Farrel Conners, MD Taking Active   hydrALAZINE (APRESOLINE) 25 MG tablet [id number] No Take 1 tablet (25 mg total) by mouth daily as needed (for BP >140/90 mmHg, up to three times daily). Cantwell, Celeste C, PA-C Taking Expired 09/05/22 2359   hydrocortisone 2.5 % cream [id number] No Apply topically as needed. [provider] Taking Active   isosorbide mononitrate (IMDUR) 60 MG 24 hr tablet [id number] No Take 1 tablet (60 mg total) by mouth daily. Farrel Conners, MD Taking Active   L-Methylfolate [facility]) 7.5 MG TABS [id number] No Take 7.5 mg by mouth daily with breakfast. [provider] Taking Active Self  Lancets (ONETOUCH DELICA PLUS EZMOQH47M) [place] [id number] No USE TO CHECK BLOOD  SUGAR 2 TIMES A DAY Caren Macadam, MD Taking Active   Lancets (ONETOUCH DELICA PLUS [id number]) [place] [id number] No USE AS DIRECTED Koberlein, Steele Berg, MD Taking Active   levothyroxine (SYNTHROID) 75 MCG tablet [id number]  Take 1 tablet (75 mcg total) by mouth daily before breakfast. Farrel Conners, MD  Active   lisinopril (ZESTRIL) 5 MG tablet [id number] No Take 1 tablet (5 mg total) by mouth daily. Farrel Conners, MD Taking Active   LYRICA 150 MG capsule [id number] No Take 150 mg by mouth 3 (three) times daily.  [provider] Taking Active Self  metFORMIN (GLUCOPHAGE) 500 MG tablet [id number] No Take 1 tablet (500 mg total) by mouth 2 (two) times daily with a meal. Farrel Conners, MD Taking Active   metoprolol succinate (TOPROL-XL) 25 MG 24 hr tablet [id number] No Take 1 tablet (25 mg total) by mouth daily. TAKE WITH OR IMMEDIATELY FOLLOWING A MEAL. Cantwell, Celeste C, PA-C Taking Active   Misc Natural Products (GLUCOS-CHONDROIT-MSM COMPLEX PO) [id number] No Take 2 tablets by mouth daily. [provider] Taking Active Self  mometasone (ELOCON) 0.1 % cream [id number] No Apply topically. [provider] Taking Active   montelukast (SINGULAIR) 10 MG tablet [id number] No Take 1 tablet (10 mg total) by mouth at bedtime. Parrett, Fonnie Mu, NP Taking Active   Multiple Vitamin (MULITIVITAMIN WITH MINERALS) TABS [id number] No Take 1 tablet by mouth daily. [provider] Taking Active Self  mupirocin cream (BACTROBAN) 2 % [id number] No Apply once daily to lesions on feet as needed. Marzetta Board, DPM Taking Active   nitroGLYCERIN (NITROSTAT) 0.4 MG SL tablet [id number] No Place 1 tablet (0.4 mg total) under the tongue every 5 (five) minutes as needed for chest pain. Caren Macadam, MD Taking Active   NURTEC 75 MG TBDP [id number] No  [provider] Taking Active   ondansetron (ZOFRAN) 4 MG tablet [id number] No Take 1 tablet (4 mg total) by mouth every 8 (eight) hours as needed for nausea or vomiting. Irene Shipper, MD Taking Active   [facility] ULTRA test strip [id number] No USE TO CHECK BLOOD SUGAR 2 TIMES A DAY Koberlein, Junell C, MD Taking Active   oxyCODONE (ROXICODONE) 15 MG immediate release tablet [id number] No Take 15 mg by mouth 5 (five) times daily. [provider] Taking Active   oxyCODONE (ROXICODONE) 15 MG immediate release tablet [id number] No Take 1 tablet by mouth four times a day  Taking Active   potassium chloride (KLOR-CON) 10 MEQ tablet [id number] No Take 2 tablets (20 mEq total) by mouth daily. Farrel Conners, MD Taking Active   promethazine [facility]) 25 MG tablet [id number] No TAKE 1 TABLET BY MOUTH EVERYDAY AT BEDTIME Irene Shipper, MD Taking Active   tiZANidine (ZANAFLEX) 4 MG tablet [id number] No Take 4 mg by mouth every 8 (eight) hours as needed for muscle spasms. [provider] Taking Active Self  vitamin B-12 (CYANOCOBALAMIN) 500 MCG tablet [id number] No Take 500 mcg by mouth daily.  [provider] Taking Active Self  vitamin E 200 UNIT capsule [id number] No Take 200 Units by mouth daily. [provider] Taking Active             Patient Active Problem List   Diagnosis Date Noted   Hypothyroidism 09/11/2022   Dental caries 06/12/2022   Spondylosis without myelopathy or radiculopathy, cervical region 04/21/2021   Neutropenia with fever ([place]) 10/01/2019   Genetic testing 09/17/2019   Family  history of melanoma    Family history of pancreatic cancer    Malignant neoplasm of upper-outer quadrant of left breast in female, estrogen receptor negative ([place]) 09/07/2019   Encounter for loop recorder at end of battery life 04/24/2018   Esophageal stricture 07/01/2017   Hyperlipidemia associated with type 2 diabetes mellitus ([place]) 05/20/2017   Allergic rhinitis  03/11/2017   Dilated cardiomyopathy ([place]) 02/07/2017   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 11/13/2016   Morbid obesity due to excess calories ([place]) 09/26/2016   Increased endometrial stripe thickness 06/27/2016   Intramural leiomyoma of uterus 06/27/2016   Type 2 diabetes mellitus without complication, without long-term current use of insulin ([place]) 12/06/2015   HTN (hypertension), benign 07/19/2015   Arrhythmia 07/19/2015   Orthostatic hypotension 07/19/2015   GERD (gastroesophageal reflux disease) 07/19/2015   Chronic back pain 07/19/2015   Neuropathy 07/19/2015   Symptomatic PVCs 11/02/2014   Syncope 11/02/2014   Chest pain 12/23/2013   Disorder of cervix 03/10/2013   Vaginal atrophy 03/10/2013   OSA (obstructive sleep apnea) 07/31/2012   Asthma 04/29/2012   Coronary artery disease 11/20/2011    Immunization History  Administered Date(s) Administered   Influenza Split 08/19/2011   Influenza Whole 01/03/2013   Influenza,inj,Quad PF,6+ Mos 08/20/2013, 09/15/2014, 08/26/2015, 08/01/2016, 08/22/2017, 10/15/2018, 09/24/2019, 12/07/2020, 02/05/2022, 09/06/2022   Influenza-Unspecified 08/03/2016   Moderna Sars-Covid-2 Vaccination 03/11/2020, 04/08/2020, 08/03/2020   PNEUMOCOCCAL CONJUGATE-20 02/05/2022   Pneumococcal Conjugate-13 02/24/2021   Pneumococcal Polysaccharide-23 08/19/2011, 12/12/2017   Tdap 06/28/2014   Zoster Recombinat (Shingrix) 09/03/2022   Patient reports she was denied by [facility] because she made $6 over the limit. She reports several of her medications are still expensive and the most expensive is Nexium.   Patient reports she has had some diarrhea over the past few weeks and even blood in her stool. She has seen some shells of tablets in her stool and was curious if she should be worried about this and not absorbing medications.   Patient also inquired about timing of medications as she has to wake up in the middle of the night to take levothyroxine and  wondered if we could have a better schedule to help with preventing this.  -getting farxiga regularly?  -how often does she use Lasix?  -yeast infections? More than one?  -BP?  -Bgs?  -repeat DEXA order? Last in [facility] at [facility] gynecology  -nurtec?   -breathing?   Conditions to be addressed/monitored:  Hypertension, Hyperlipidemia, Diabetes, Coronary Artery Disease, GERD, Asthma, Hypothyroidism, Depression, and Allergic Rhinitis  Conditions to be addressed this visit: Diabetes, hypertension  There are no care plans that you recently modified to display for this patient.    Medication Assistance:  Wilder Glade obtained through [place] medication assistance program.  Enrollment ends 12/02/22  Compliance/Adherence/Medication fill history: Care Gaps: COVID booster, shingrix (second dose), PAP smear Last BP - 100/62 on 09/06/2022 Last A1C - 6.5 on 09/06/2022  Star-Rating Drugs: Atorvastatin 80 mg - last filled 09/07/2022 30 [facility] Glipizide 5 mg - last filled 09/07/2022 30 [facility] Lisinopril 5 mg - last filled 09/07/2022 30 [facility] at [facility] Metformin 500 mg - last filled 09/07/2022 30 [facility] at [facility] Farxiga 10 mg  - Patient assistance   Patient's preferred pharmacy is:  [facility] - [place], [place] - [address] [place] [address] Phone: [phone] Fax: [phone]  [id number] [place] [address] [place] [address] Phone: [phone] Fax: [phone]   Uses pill box? Yes Pt endorses 90% compliance  We discussed: Benefits of medication synchronization, packaging and delivery as well as enhanced pharmacist oversight with [facility]. Patient decided to: Utilize [facility] pharmacy for medication synchronization, packaging and delivery  Care Plan and Follow Up Patient Decision:  Patient agrees to Care Plan and  Follow-up.  Plan: Telephone follow up appointment with care management team member scheduled for:  3 months  [name], PharmD, [place] Pharmacist [place] at [place] [phone]

## 2022-09-27 ENCOUNTER — Telehealth: Payer: Self-pay | Admitting: Pharmacist

## 2022-09-27 NOTE — Chronic Care Management (AMB) (Signed)
    Chronic Care Management Pharmacy Assistant   Name: Shelby Anderson  MRN: [id number] DOB: 1956-10-01  Reason for Encounter: Reschedule 09/26/2022 appointment with [name] Clinical Pharmacist  Rescheduled with patient to 10/22/2022   [place] Pharmacist Assistant [phone]

## 2022-10-01 ENCOUNTER — Other Ambulatory Visit: Payer: Self-pay | Admitting: Family Medicine

## 2022-10-02 ENCOUNTER — Telehealth: Payer: Self-pay | Admitting: Pharmacist

## 2022-10-02 NOTE — Chronic Care Management (AMB) (Signed)
Chronic Care Management Pharmacy Assistant   Name: Shelby Anderson  MRN: [id number] DOB: Aug 20, 1956  Reason for Encounter: Medication Review / Medication Coordination Call  Recent office visits:  09/06/2022 [name] MD - Patient was seen for Type 2 diabetes mellitus without complication, without long-term current use of insulin and additional concerns. Discontinued Jardiance. Follow up in 5 months.   Recent consult visits:  09/18/2022 Acquanetta Sit DPM - Patient was seen for Pain due to onychomycosis of toenails of both feet and additional concerns. No medication changes. Follow up in 3 months.   Hospital visits:  None  Medications: Outpatient Encounter Medications as of 10/02/2022  Medication Sig   albuterol (PROVENTIL) (2.5 MG/3ML) 0.083% nebulizer solution Take 3 mLs (2.5 mg total) by nebulization every 6 (six) hours as needed for wheezing or shortness of breath (J45.40).   albuterol (VENTOLIN HFA) 108 (90 Base) MCG/ACT inhaler TAKE 2 PUFFS BY MOUTH EVERY 6 HOURS AS NEEDED FOR WHEEZE OR SHORTNESS OF BREATH   aspirin EC 81 MG tablet Take 81 mg at bedtime by mouth.    atorvastatin (LIPITOR) 80 MG tablet TAKE ONE TABLET BY MOUTH EVERYDAY AT BEDTIME Last refill, patient needs to make appt with PCP for further refills   Blood Glucose Monitoring Suppl (ONE TOUCH ULTRA 2) w/Device KIT Use as directed   budesonide-formoterol (SYMBICORT) 80-4.5 MCG/ACT inhaler INHALE 2 PUFFS INTO THE LUNGS EVERY MORNING AND ANOTHER 2 PUFFS 12 HOURS LATER   budesonide-formoterol (SYMBICORT) 80-4.5 MCG/ACT inhaler Inhale 2 puffs into the lungs every morning and another 2 puffs 12 hours later.   buPROPion (WELLBUTRIN XL) 150 MG 24 hr tablet Take 1 tablet by mouth daily.   Calcium Carbonate-Vitamin D 600-200 MG-UNIT TABS Take 1 tablet by mouth daily.   CINNAMON PO Take 1,000 mg 2 (two) times daily by mouth.   dapagliflozin propanediol (FARXIGA) 10 MG TABS tablet Take 1 tablet (10 mg total) by  mouth daily before breakfast.   doxepin (SINEQUAN) 10 MG capsule Take by mouth.   esomeprazole (NEXIUM) 40 MG capsule Take 1 capsule (40 mg total) by mouth 2 (two) times daily.   famotidine (PEPCID) 20 MG tablet Take 1 tablet (20 mg total) by mouth 2 (two) times daily as needed for heartburn or indigestion.   fluticasone (FLONASE) 50 MCG/ACT nasal spray Place into the nose.   furosemide (LASIX) 20 MG tablet TAKE 1 OR 2 TABLETS BY MOUTH EVERY DAY AS DIRECTED   glipiZIDE (GLUCOTROL XL) 5 MG 24 hr tablet TAKE ONE TABLET BY MOUTH EVERY MORNING Last refill, patient needs to make appt with PCP for further refills   hydrALAZINE (APRESOLINE) 25 MG tablet Take 1 tablet (25 mg total) by mouth daily as needed (for BP >140/90 mmHg, up to three times daily).   hydrocortisone 2.5 % cream Apply topically as needed.   isosorbide mononitrate (IMDUR) 60 MG 24 hr tablet TAKE ONE TABLET BY MOUTH EVERY MORNING Last refill, patient needs to make appt with PCP for further refills   L-Methylfolate (DEPLIN) 7.5 MG TABS Take 7.5 mg by mouth daily with breakfast.   Lancets (ONETOUCH DELICA PLUS [id number]) MISC USE TO CHECK BLOOD SUGAR 2 TIMES A DAY   Lancets (ONETOUCH DELICA PLUS [id number]) MISC USE AS DIRECTED   levothyroxine (SYNTHROID) 75 MCG tablet Take 1 tablet (75 mcg total) by mouth daily before breakfast.   lisinopril (ZESTRIL) 5 MG tablet Take 1 tablet (5 mg total) by mouth daily.   LYRICA 150 MG capsule Take  150 mg by mouth 3 (three) times daily.    metFORMIN (GLUCOPHAGE) 500 MG tablet TAKE ONE TABLET BY MOUTH TWICE DAILY WITH A MEAL Last refill, patient needs to make appt with PCP for refills   metoprolol succinate (TOPROL-XL) 25 MG 24 hr tablet Take 1 tablet (25 mg total) by mouth daily. TAKE WITH OR IMMEDIATELY FOLLOWING A MEAL.   Misc Natural Products (GLUCOS-CHONDROIT-MSM COMPLEX PO) Take 2 tablets by mouth daily.   mometasone (ELOCON) 0.1 % cream Apply topically.   montelukast (SINGULAIR) 10 MG tablet Take  1 tablet (10 mg total) by mouth at bedtime.   Multiple Vitamin (MULITIVITAMIN WITH MINERALS) TABS Take 1 tablet by mouth daily.   mupirocin cream (BACTROBAN) 2 % Apply once daily to lesions on feet as needed.   nitroGLYCERIN (NITROSTAT) 0.4 MG SL tablet Place 1 tablet (0.4 mg total) under the tongue every 5 (five) minutes as needed for chest pain.   NURTEC 75 MG TBDP    ondansetron (ZOFRAN) 4 MG tablet Take 1 tablet (4 mg total) by mouth every 8 (eight) hours as needed for nausea or vomiting.   ONETOUCH ULTRA test strip USE TO CHECK BLOOD SUGAR 2 TIMES A DAY   oxyCODONE (ROXICODONE) 15 MG immediate release tablet Take 15 mg by mouth 5 (five) times daily.   oxyCODONE (ROXICODONE) 15 MG immediate release tablet Take 1 tablet by mouth four times a day   potassium chloride (KLOR-CON) 10 MEQ tablet Take 2 tablets (20 mEq total) by mouth daily.   promethazine (PHENERGAN) 25 MG tablet TAKE 1 TABLET BY MOUTH EVERYDAY AT BEDTIME   tiZANidine (ZANAFLEX) 4 MG tablet Take 4 mg by mouth every 8 (eight) hours as needed for muscle spasms.   vitamin B-12 (CYANOCOBALAMIN) 500 MCG tablet Take 500 mcg by mouth daily.    vitamin E 200 UNIT capsule Take 200 Units by mouth daily.   No facility-administered encounter medications on file as of 10/02/2022.   Reviewed chart for medication changes ahead of medication coordination call.  BP Readings from Last 3 Encounters:  09/06/22 100/62  08/07/22 100/60  06/19/22 106/63    Lab Results  Component Value Date   HGBA1C 6.5 (A) 09/06/2022     Patient obtains medications through Adherence Packaging  30 Days    Last adherence delivery included:  Levothyroxine 75 mcg - 1 tablets before breakfast  Tizanidine 4 mg - 2 tablets at breakfast, lunch and dinner  L-methylfolate 7.5 mg - 1 tablet at breakfast Lyrica 150 mg - 1 tablet at breakfast, lunch and dinner Phenergan 25 mg - 1 tablet at bedtime Montelukast 10 mg - 1 tablet at bedtime Potassium 10 meq - 2 tablets  at breakfast Metformin 500 mg - 1 tablet at breakfast and dinner Wellbutrin XL 150 mg - 1 tablet at breakfast Isosorbide 60 mg - 1 tablet at breakfast Metoprolol 25 mg  - 1 tablet at breakfast Atorvastatin 80 mg - 1 tablet at bedtime Lisinopril 5 mg - 1 tablet at breakfast Glipizide 5 mg - 1 tablet at breakfast Pepcid 20 mg - 1 tablet at breakfast and dinner Doxepine 10 mg - 3 tablets at dinner    Patient declined last month:     Patient is due for next adherence delivery on: 10/12/2022   Called patient and reviewed medications and coordinated delivery.   This delivery to include: Doxepine 10 mg - 3 tablets at bedtime Levothyroxine 75 mcg - 1 tablets before breakfast  Tizanidine 4 mg - 2  tablets at breakfast, lunch and dinner  L-methylfolate 7.5 mg - 1 tablet at breakfast Lyrica 150 mg - 1 tablet at breakfast, lunch and dinner Phenergan 25 mg - 1 tablet at bedtime Montelukast 10 mg - 1 tablet at bedtime Potassium 10 meq - 2 tablets at breakfast Metformin 500 mg - 1 tablet at breakfast and dinner Wellbutrin XL 150 mg - 1 tablet at breakfast Isosorbide 60 mg - 1 tablet at breakfast Metoprolol 25 mg  - 1 tablet at breakfast Atorvastatin 80 mg - 1 tablet at bedtime Lisinopril 5 mg - 1 tablet at breakfast Glipizide 5 mg - 1 tablet at breakfast Pepcid 20 mg - 1 tablet at breakfast and dinner   Patient will need a short fill: No short fill needed   Coordinated acute fill: No acute fill needed   Patient declined the following medications:  Nexium - she is getting this OTC as needed, it cost less than prescription.   Confirmed delivery date of 10/12/2022, advised patient that pharmacy will contact them the morning of delivery.   Care Gaps: AWV - scheduled 08/12/2023 Last BP - 100/62 on 09/06/2022 Last A1C - 6.5 on 09/06/2022 Covid - overdue Papsmear - postponed  Star Rating Drugs: Atorvastatin 80 mg - last filled 09/07/2022 30 [facility] Glipizide 5 mg - last filled  09/07/2022 [facility] Lisinopril 5 mg - last filled 09/07/2022 [facility] Metformin 500 mg - last filled 09/07/2022 30 [facility] Farxiga 10 mg  - no fill history   [facility] Pharmacist Assistant [phone]

## 2022-10-08 ENCOUNTER — Other Ambulatory Visit: Payer: Self-pay | Admitting: Family Medicine

## 2022-10-08 ENCOUNTER — Other Ambulatory Visit: Payer: Self-pay | Admitting: *Deleted

## 2022-10-08 DIAGNOSIS — E119 Type 2 diabetes mellitus without complications: Secondary | ICD-10-CM

## 2022-10-08 MED ORDER — GLUCOSE BLOOD VI STRP: ORAL_STRIP | 3 refills | Status: DC

## 2022-10-14 DIAGNOSIS — J45909 Unspecified asthma, uncomplicated: Secondary | ICD-10-CM | POA: Diagnosis not present

## 2022-10-14 DIAGNOSIS — G4733 Obstructive sleep apnea (adult) (pediatric): Secondary | ICD-10-CM | POA: Diagnosis not present

## 2022-10-22 ENCOUNTER — Ambulatory Visit: Payer: Medicare Other

## 2022-10-24 ENCOUNTER — Telehealth: Payer: Self-pay | Admitting: Pharmacist

## 2022-10-24 NOTE — Chronic Care Management (AMB) (Signed)
    Chronic Care Management Pharmacy Assistant   Name: Shelby Anderson  MRN: [id number] DOB: [birth year]  10/29/2022 APPOINTMENT REMINDER  Shelby Anderson was reminded to have all medications, supplements and any blood glucose and blood pressure readings available for review with Shelby Anderson, Pharm. D, at her office visit on 10/29/2022 at 4:15.  Care Gaps: AWV - scheduled 08/12/2023 Last BP - 100/62 on 09/06/2022 Last A1C - 6.5 on 09/06/2022 Covid  - overdue Pap smear - postponed  Star Rating Drug: Atorvastatin 80 mg - last filled 10/09/2022 30 [facility] Glipizide 5 mg - last filled 10/09/2022 30 [facility] Lisinopril 5 mg - last filled 10/09/2022 30 [facility] Metformin 500 mg - last filled 10/09/2022 30 [facility] Farxiga 10 mg  - no fill history   Any gaps in medications fill history? No  [facility]  Catering manager [phone]

## 2022-10-29 ENCOUNTER — Other Ambulatory Visit: Payer: Self-pay | Admitting: *Deleted

## 2022-10-29 ENCOUNTER — Other Ambulatory Visit: Payer: Self-pay | Admitting: Internal Medicine

## 2022-10-29 ENCOUNTER — Ambulatory Visit (INDEPENDENT_AMBULATORY_CARE_PROVIDER_SITE_OTHER): Payer: Medicare Other | Admitting: Pharmacist

## 2022-10-29 DIAGNOSIS — I10 Essential (primary) hypertension: Secondary | ICD-10-CM

## 2022-10-29 DIAGNOSIS — Z780 Asymptomatic menopausal state: Secondary | ICD-10-CM

## 2022-10-29 DIAGNOSIS — E119 Type 2 diabetes mellitus without complications: Secondary | ICD-10-CM

## 2022-10-29 MED ORDER — NURTEC 75 MG PO TBDP: 1.0000 | ORAL_TABLET | Freq: Every day | ORAL | 0 refills | Status: AC | PRN

## 2022-10-29 NOTE — Progress Notes (Unsigned)
Chronic Care Management Pharmacy Note  10/30/2022 Name:  Shelby Anderson MRN:  [id number] DOB:  [birth year]  Summary: Pt is not having as much trouble affording her medications A1c at goal < 7% Pt is having lots of migraines  Recommendations/Changes made from today's visit: -Recommended repeat DEXA scan -Recommended LCSW outreach for food resources -Recommended migraine prevention medication and consider follow up with neurology  Plan: Provided PAP for Ubrelvy to consider with pain management/neurology Follow up to schedule DEXA  Subjective: Shelby Anderson is an 66 y.o. year old female who is a primary patient of Legrand Como, Royston Cowper, MD.  The CCM team was consulted for assistance with disease management and care coordination needs.    Engaged with patient face to face for follow up visit in response to provider referral for pharmacy case management and/or care coordination services.   Consent to Services:  The patient was given information about Chronic Care Management services, agreed to services, and gave verbal consent prior to initiation of services.  Please see initial visit note for detailed documentation.   Patient Care Team: Farrel Conners, MD as PCP - General (Family Medicine) Rigoberto Noel, MD as Consulting Physician (Pulmonary Disease) Adrian Prows, MD as Consulting Physician (Cardiology) Lenon Oms, MD as Referring Physician (Obstetrics and Gynecology) Veneda Melter, MD as Referring Physician (Cardiology) [name], MD as Consulting Physician (Anesthesiology) Rolm Bookbinder, MD as Consulting Physician (General Surgery) Nicholas Lose, MD as Consulting Physician (Hematology and Oncology) Kyung Rudd, MD as Consulting Physician (Radiation Oncology) Alda Berthold, DO as Consulting Physician (Neurology) Jarome Matin, MD as Consulting Physician (Dermatology) Viona Gilmore, [facility] as Pharmacist (Pharmacist)  Recent office  visits: 09/06/2022 [name] MD - Patient was seen for Type 2 diabetes mellitus without complication, without long-term current use of insulin and additional concerns. Discontinued Jardiance. Follow up in 5 months.    08/07/2022 [name] LPN - Patient was seen for  Medicare annual wellness exam.    05/11/2022 - Alysia Penna MD - Patient was seen for yeast vaginitis and an additional issue. Started Fluconazole 150 mg daily. No follow up noted.    Recent consult visits: 09/18/2022 Acquanetta Sit DPM - Patient was seen for Pain due to onychomycosis of toenails of both feet and additional concerns. No medication changes. Follow up in 3 months.    06/13/2022 Acquanetta Sit DPM (podiatry) - Patient was seen for Pain due to onychomycosis of toenails of both feet and additional issues. Changed Bupropion to 150 mg 24 hr tab daily. Decreased Doxepin to 10 mg daily.Follow up in 3 months.    06/12/2022 Tammy Parrett NP (pulmonary) - Patient was seen for Mild persistent asthma without complication and additional issues. No medication changes. Follow up in 6 months.   05/17/2022 Rolm Bookbinder MD (general surgery) - Patient was seen for Malignant neoplasm of upper-outer quadrant of left breast in female, estrogen receptor negative. No medication changes. Follow up in 1 year.   Hospital visits: Patient was seen at [facility] on 06/19/2022 (1 hr) due to right wrist pain, acute pain for right knee and left foot pain after a fall.    New?Medications Started at [facility] Discharge:?? No new medications Medication Changes at Hospital Discharge: No medication changes Medications Discontinued at Hospital Discharge: No medications discontinued Medications that remain the same after Hospital Discharge:??  -All other medications will remain the same.     Objective:  Lab Results  Component Value Date   CREATININE  0.79 01/25/2022   BUN 16 01/25/2022   GFR 79.36 01/25/2022   GFRNONAA >60  12/08/2019   GFRAA >60 12/08/2019   NA 140 01/25/2022   K 4.1 01/25/2022   CALCIUM 9.5 01/25/2022   CO2 31 01/25/2022   GLUCOSE 135 (H) 01/25/2022    Lab Results  Component Value Date/Time   HGBA1C 6.5 (A) 09/06/2022 04:10 PM   HGBA1C 7.5 (H) 01/25/2022 01:11 PM   HGBA1C 7.4 (H) 02/27/2021 01:00 PM   HGBA1C 5.9 01/12/2017 12:00 AM   GFR 79.36 01/25/2022 01:11 PM   GFR 94.01 02/27/2021 01:00 PM   MICROALBUR 7.7 (H) 01/25/2022 01:11 PM   MICROALBUR 1.1 02/27/2021 01:00 PM    Last diabetic Eye exam:  Lab Results  Component Value Date/Time   HMDIABEYEEXA No Retinopathy 04/17/2022 12:00 AM    Last diabetic Foot exam: No results found for: "HMDIABFOOTEX"   Lab Results  Component Value Date   CHOL 127 01/25/2022   HDL 35.80 (L) 01/25/2022   LDLCALC 62 01/25/2022   TRIG 146.0 01/25/2022   CHOLHDL 4 01/25/2022       Latest Ref Rng & Units 01/25/2022    1:11 PM 02/27/2021    1:00 PM 09/30/2020    3:33 PM  Hepatic Function  Total Protein 6.0 - 8.3 g/dL 7.3  6.5  7.5   Albumin 3.5 - 5.2 g/dL 4.2  3.7    AST 0 - 37 U/L _0 ALT 0 - 35 U/L _1 Alk Phosphatase 39 - 117 U/L 76  68    Total Bilirubin 0.2 - 1.2 mg/dL 0.5  0.4  0.5     Lab Results  Component Value Date/Time   TSH 1.63 04/02/2022 11:08 AM   TSH 9.47 (H) 01/25/2022 01:11 PM   FREET4 1.2 09/30/2020 03:33 PM   FREET4 0.81 04/10/2016 03:11 PM       Latest Ref Rng & Units 01/25/2022    1:11 PM 02/27/2021    1:00 PM 09/30/2020    3:33 PM  CBC  WBC 4.0 - 10.5 K/uL 8.0  5.5  5.5   Hemoglobin 12.0 - 15.0 g/dL 13.3  12.6  13.7   Hematocrit 36.0 - 46.0 % 40.3  38.1  42.8   Platelets 150.0 - 400.0 K/uL 271.0  222.0  271     Lab Results  Component Value Date/Time   VD25OH 57 09/30/2020 03:33 PM    Clinical ASCVD: Yes  The ASCVD Risk score (Arnett DK, et al., 2019) failed to calculate for the following reasons:   The patient has a prior MI or stroke diagnosis       09/06/2022    4:07 PM  08/07/2022    3:25 PM 05/11/2022    1:22 PM  Depression screen PHQ 2/9  Decreased Interest _2 Down, Depressed, Hopeless _3 PHQ - 2 Score _4 Altered sleeping _5 Tired, decreased energy _6 Change in appetite _7 Feeling bad or failure about yourself  0 0 0  Trouble concentrating 0 0 0  Moving slowly or fidgety/restless 0 0 0  Suicidal thoughts 0 0 0  PHQ-9 Score _8 Difficult doing work/chores Somewhat difficult Somewhat difficult Somewhat difficult      Social History   Tobacco Use  Smoking Status Never  Smokeless Tobacco Never   BP Readings from  Last 3 Encounters:  09/06/22 100/62  08/07/22 100/60  06/19/22 106/63   Pulse Readings from Last 3 Encounters:  09/06/22 66  08/07/22 74  06/19/22 77   Wt Readings from Last 3 Encounters:  09/06/22 215 lb 12.8 oz (97.9 kg)  08/07/22 217 lb 9.6 oz (98.7 kg)  06/12/22 216 lb 9.6 oz (98.2 kg)   BMI Readings from Last 3 Encounters:  09/06/22 35.91 kg/m  08/07/22 36.21 kg/m  06/12/22 34.96 kg/m    Assessment/Interventions: Review of patient past medical history, allergies, medications, health status, including review of consultants reports, laboratory and other test data, was performed as part of comprehensive evaluation and provision of chronic care management services.   SDOH:  (Social Determinants of Health) assessments and interventions performed: Yes SDOH Interventions    Flowsheet Row Chronic Care Management from 10/29/2022 in [place] at [place] from 08/07/2022 in [place] at [place] Management from 01/29/2022 in [place] at [place] Management from 07/17/2021 in [place] at [place] Patient Outreach Telephone from 11/04/2019 in [place] Patient Outreach Telephone from 07/27/2019 in [place] Interventions -- Intervention Not Indicated  Other (Comment)  Dossie Arbour guide referral for food resources] -- -- --  Housing Interventions -- -- Intervention Not Indicated -- -- --  Transportation Interventions -- Intervention Not Indicated Intervention Not Indicated Intervention Not Indicated -- --  Depression Interventions/Treatment  -- Currently on Treatment -- -- Medication, Currently on Treatment Counseling, Currently on Treatment  Financial Strain Interventions Other (Comment)  [reached out to [facility] [facility] for resources] Other (Comment)  Dossie Arbour guide referral] Other (Comment)  Dossie Arbour guide referral] Other (Comment)  [working on patient assistance] -- --  Stress Interventions -- Intervention Not Indicated Intervention Not Indicated -- -- --       SDOH Screenings   Food Insecurity: No Food Insecurity (08/07/2022)  Housing: Low Risk  (01/29/2022)  Transportation Needs: No Transportation Needs (08/07/2022)  Depression (PHQ2-9): Medium Risk (09/06/2022)  Financial Resource Strain: Medium Risk (10/30/2022)  Physical Activity: Sufficiently Active (08/07/2022)  Social Connections: Moderately Isolated (07/18/2021)  Stress: No Stress Concern Present (08/07/2022)  Tobacco Use: Low Risk  (09/22/2022)    CCM Care Plan  Allergies  Allergen Reactions   Lodine [Etodolac] Anaphylaxis, Hives and Swelling   Oxycodone Anaphylaxis    Other reaction(s): Other (See Comments) UNKNOWN  hives, trouble breathing, tongue swelling (Only Oxycontin) Tolerates plain oxycodone.   Oxycontin [Oxycodone Hcl] Anaphylaxis    hives, trouble breathing, tongue swelling (Only Oxycontin) Tolerates plain oxycodone.   Penicillins Anaphylaxis    Told by a surgeon never to take it again. Has patient had a PCN reaction causing immediate rash, facial/tongue/throat swelling, SOB or lightheadedness with hypotension: Yes Has patient had a PCN reaction causing severe rash involving mucus membranes or skin necrosis: Unknown Has patient had a PCN reaction that required hospitalization: No Has  patient had a PCN reaction occurring within the last 10 years: No If all of the above answers are "NO", then may proceed with Cephalosporin use.   Aspirin Other (See Comments)    High-dose caused GI Bleeds   Darvocet [Propoxyphene N-Acetaminophen] Hives   Nitroglycerin Other (See Comments)    IV-BP drops dramatically Can take po   Propoxyphene Hives   Tramadol Hives and Itching   Ultram [Tramadol Hcl] Hives   Valium Other (See Comments)    Circulation problems. "Legs turned black".  Medications Reviewed Today     Reviewed by Marzetta Board, DPM (Physician) on 09/22/22 at [place] List Status: <None>   Medication Order Taking? Sig Documenting Provider Last Dose Status Informant  albuterol (PROVENTIL) (2.5 MG/3ML) 0.083% nebulizer solution [id number] No Take 3 mLs (2.5 mg total) by nebulization every 6 (six) hours as needed for wheezing or shortness of breath (J45.40). Javier Glazier, MD Taking Active   albuterol (VENTOLIN HFA) 108 (90 Base) MCG/ACT inhaler [id number] No TAKE 2 PUFFS BY MOUTH EVERY 6 HOURS AS NEEDED FOR WHEEZE OR SHORTNESS OF BREATH Parrett, Tammy S, NP Taking Active   aspirin EC 81 MG tablet [id number] No Take 81 mg at bedtime by mouth.  [provider] Taking Active Self  atorvastatin (LIPITOR) 80 MG tablet [id number] No Take 1 tablet (80 mg total) by mouth daily. Farrel Conners, MD Taking Active   Blood Glucose Monitoring Suppl (ONE TOUCH ULTRA 2) w/Device Drucie Opitz [id number] No Use as directed Caren Macadam, MD Taking Active   budesonide-formoterol (SYMBICORT) 80-4.5 MCG/ACT inhaler [id number] No INHALE 2 PUFFS INTO THE LUNGS EVERY MORNING AND ANOTHER 2 PUFFS 12 HOURS LATER Parrett, Tammy S, NP Taking Active   budesonide-formoterol (SYMBICORT) 80-4.5 MCG/ACT inhaler [id number] No Inhale 2 puffs into the lungs every morning and another 2 puffs 12 hours later. Parrett, Fonnie Mu, NP Taking Active   buPROPion (WELLBUTRIN XL) 150 MG 24 hr tablet [id number] No  Take 1 tablet by mouth daily. [provider] Taking Active   Calcium Carbonate-Vitamin D 600-200 MG-UNIT TABS [id number] No Take 1 tablet by mouth daily. [provider] Taking Active Self  CINNAMON PO [id number] No Take 1,000 mg 2 (two) times daily by mouth. [provider] Taking Active Self  dapagliflozin propanediol (FARXIGA) 10 MG TABS tablet [id number] No Take 1 tablet (10 mg total) by mouth daily before breakfast. Farrel Conners, MD Taking Active   doxepin (SINEQUAN) 10 MG capsule [id number] No Take by mouth. [provider] Taking Active   esomeprazole (NEXIUM) 40 MG capsule [id number] No Take 1 capsule (40 mg total) by mouth 2 (two) times daily. Irene Shipper, MD Taking Active   famotidine (PEPCID) 20 MG tablet [id number] No Take 1 tablet (20 mg total) by mouth 2 (two) times daily as needed for heartburn or indigestion. Irene Shipper, MD Taking Active   fluticasone [facility]) 50 MCG/ACT nasal spray [id number] No Place into the nose. [provider] Taking Active   furosemide (LASIX) 20 MG tablet [id number] No TAKE 1 OR 2 TABLETS BY MOUTH EVERY DAY AS DIRECTED Koberlein, Junell C, MD Taking Active   glipiZIDE (GLUCOTROL XL) 5 MG 24 hr tablet [id number] No TAKE 1 TABLET BY MOUTH EVERYDAY WITH BREAKFAST. Farrel Conners, MD Taking Active   hydrALAZINE (APRESOLINE) 25 MG tablet [id number] No Take 1 tablet (25 mg total) by mouth daily as needed (for BP >140/90 mmHg, up to three times daily). Cantwell, Celeste C, PA-C Taking Expired 09/05/22 2359   hydrocortisone 2.5 % cream [id number] No Apply topically as needed. [provider] Taking Active   isosorbide mononitrate (IMDUR) 60 MG 24 hr tablet [id number] No Take 1 tablet (60 mg total) by mouth daily. Farrel Conners, MD Taking Active   L-Methylfolate [facility]) 7.5 MG TABS [id number] No Take 7.5 mg by mouth daily with breakfast. [provider] Taking Active Self  Lancets (ONETOUCH DELICA  PLUS [id number]) [place] [id number] No USE TO CHECK BLOOD SUGAR 2 TIMES A DAY Koberlein,  Steele Berg, MD Taking Active   Lancets (ONETOUCH DELICA PLUS [id number]) [place] [id number] No USE AS DIRECTED Caren Macadam, MD Taking Active   levothyroxine (SYNTHROID) 75 MCG tablet [id number]  Take 1 tablet (75 mcg total) by mouth daily before breakfast. Farrel Conners, MD  Active   lisinopril (ZESTRIL) 5 MG tablet [id number] No Take 1 tablet (5 mg total) by mouth daily. Farrel Conners, MD Taking Active   LYRICA 150 MG capsule [id number] No Take 150 mg by mouth 3 (three) times daily.  [provider] Taking Active Self  metFORMIN (GLUCOPHAGE) 500 MG tablet [id number] No Take 1 tablet (500 mg total) by mouth 2 (two) times daily with a meal. Farrel Conners, MD Taking Active   metoprolol succinate (TOPROL-XL) 25 MG 24 hr tablet [id number] No Take 1 tablet (25 mg total) by mouth daily. TAKE WITH OR IMMEDIATELY FOLLOWING A MEAL. Cantwell, Celeste C, PA-C Taking Active   Misc Natural Products (GLUCOS-CHONDROIT-MSM COMPLEX PO) [id number] No Take 2 tablets by mouth daily. [provider] Taking Active Self  mometasone (ELOCON) 0.1 % cream [id number] No Apply topically. [provider] Taking Active   montelukast (SINGULAIR) 10 MG tablet [id number] No Take 1 tablet (10 mg total) by mouth at bedtime. Parrett, Fonnie Mu, NP Taking Active   Multiple Vitamin (MULITIVITAMIN WITH MINERALS) TABS [id number] No Take 1 tablet by mouth daily. [provider] Taking Active Self  mupirocin cream (BACTROBAN) 2 % [id number] No Apply once daily to lesions on feet as needed. Marzetta Board, DPM Taking Active   nitroGLYCERIN (NITROSTAT) 0.4 MG SL tablet [id number] No Place 1 tablet (0.4 mg total) under the tongue every 5 (five) minutes as needed for chest pain. Caren Macadam, MD Taking Active   NURTEC 75 MG TBDP [id number] No  [provider] Taking Active   ondansetron (ZOFRAN) 4 MG tablet  [id number] No Take 1 tablet (4 mg total) by mouth every 8 (eight) hours as needed for nausea or vomiting. Irene Shipper, MD Taking Active   [facility] ULTRA test strip [id number] No USE TO CHECK BLOOD SUGAR 2 TIMES A DAY Koberlein, Junell C, MD Taking Active   oxyCODONE (ROXICODONE) 15 MG immediate release tablet [id number] No Take 15 mg by mouth 5 (five) times daily. [provider] Taking Active   oxyCODONE (ROXICODONE) 15 MG immediate release tablet [id number] No Take 1 tablet by mouth four times a day  Taking Active   potassium chloride (KLOR-CON) 10 MEQ tablet [id number] No Take 2 tablets (20 mEq total) by mouth daily. Farrel Conners, MD Taking Active   promethazine [facility] 25 MG tablet [id number] No TAKE 1 TABLET BY MOUTH EVERYDAY AT BEDTIME Irene Shipper, MD Taking Active   tiZANidine (ZANAFLEX) 4 MG tablet [id number] No Take 4 mg by mouth every 8 (eight) hours as needed for muscle spasms. [provider] Taking Active Self  vitamin B-12 (CYANOCOBALAMIN) 500 MCG tablet [id number] No Take 500 mcg by mouth daily.  [provider] Taking Active Self  vitamin E 200 UNIT capsule [id number] No Take 200 Units by mouth daily. [provider] Taking Active             Patient Active Problem List   Diagnosis Date Noted   Hypothyroidism 09/11/2022   Dental caries 06/12/2022   Spondylosis without myelopathy or radiculopathy, cervical region 04/21/2021   Neutropenia with fever ([place]) 10/01/2019   Genetic testing 09/17/2019   Family history of melanoma  Family history of pancreatic cancer    Malignant neoplasm of upper-outer quadrant of left breast in female, estrogen receptor negative ([place]) 09/07/2019   Encounter for loop recorder at end of battery life 04/24/2018   Esophageal stricture 07/01/2017   Hyperlipidemia associated with type 2 diabetes mellitus ([place]) 05/20/2017   Allergic rhinitis 03/11/2017   Dilated cardiomyopathy ([place]) 02/07/2017   Cough variant  asthma 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 11/13/2016   Morbid obesity due to excess calories ([place]) 09/26/2016   Increased endometrial stripe thickness 06/27/2016   Intramural leiomyoma of uterus 06/27/2016   Type 2 diabetes mellitus without complication, without long-term current use of insulin ([place]) 12/06/2015   HTN (hypertension), benign 07/19/2015   Arrhythmia 07/19/2015   Orthostatic hypotension 07/19/2015   GERD (gastroesophageal reflux disease) 07/19/2015   Chronic back pain 07/19/2015   Neuropathy 07/19/2015   Symptomatic PVCs 11/02/2014   Syncope 11/02/2014   Chest pain 12/23/2013   Disorder of cervix 03/10/2013   Vaginal atrophy 03/10/2013   OSA (obstructive sleep apnea) 07/31/2012   Asthma 04/29/2012   Coronary artery disease 11/20/2011    Immunization History  Administered Date(s) Administered   Influenza Split 08/19/2011   Influenza Whole 01/03/2013   Influenza,inj,Quad PF,6+ Mos 08/20/2013, 09/15/2014, 08/26/2015, 08/01/2016, 08/22/2017, 10/15/2018, 09/24/2019, 12/07/2020, 02/05/2022, 09/06/2022   Influenza-Unspecified 08/03/2016   Moderna Sars-Covid-2 Vaccination 03/11/2020, 04/08/2020, 08/03/2020   PNEUMOCOCCAL CONJUGATE-20 02/05/2022   Pneumococcal Conjugate-13 02/24/2021   Pneumococcal Polysaccharide-23 08/19/2011, 12/12/2017   Tdap 06/28/2014   Zoster Recombinat (Shingrix) 09/03/2022   Patient reports the cost of her medications is so much better with [facility] so she is very pleased. She spent < $100 on her medications this past month and last year around this time spent about $300 on her meds with [facility].   Patient is pleased with her blood sugars right now as well. She reports a slight bump in her readings when starting on the Farxiga but have leveled out and have improved since then.   Conditions to be addressed/monitored:  Hypertension, Hyperlipidemia, Diabetes, Coronary Artery Disease, GERD, Asthma, Hypothyroidism, Depression, and Allergic Rhinitis  Conditions to  be addressed this visit: Diabetes, hypertension  Care Plan : CCM Pharmacy Care Plan  Updates made by Viona Gilmore, [place] since 10/30/2022 12:00 AM     Problem: Problem: Hypertension, Hyperlipidemia, Diabetes, Coronary Artery Disease, GERD, Asthma, Hypothyroidism, Depression, and Allergic Rhinitis      Long-Range Goal: Patient-Specific Goal   Start Date: 07/17/2021  Expected End Date: 07/17/2022  Recent Progress: On track  Priority: High  Note:   Current Barriers:  Unable to independently afford treatment regimen Unable to independently monitor therapeutic efficacy Unable to achieve control of cholesterol   Pharmacist Clinical Goal(s):  Patient will verbalize ability to afford treatment regimen achieve adherence to monitoring guidelines and medication adherence to achieve therapeutic efficacy achieve control of cholesterol as evidenced by next lipid panel  through collaboration with PharmD and provider.   Interventions: 1:1 collaboration with Farrel Conners, MD regarding development and update of comprehensive plan of care as evidenced by provider attestation and co-signature Inter-disciplinary care team collaboration (see longitudinal plan of care) Comprehensive medication review performed; medication list updated in electronic medical record  Hypertension (BP goal <130/80) -Controlled -Current treatment: Metoprolol succinate 25 mg 1 tablet daily - Appropriate, Effective, Safe, Accessible -Medications previously tried: none  -Current home readings: 130-140s/80-90s; checking once or twice a day -Current dietary habits: doesn't use any salt; chicken and porchops or ribs; no red meat in a  while; 1-2 vegetables every day; not much fruit -Current exercise habits: walking some -Reports hypotensive/hypertensive symptoms -Educated on BP goals and benefits of medications for prevention of heart attack, stroke and kidney damage; Exercise goal of 150 minutes per week; Importance  of home blood pressure monitoring; Proper BP monitoring technique; Symptoms of hypotension and importance of maintaining adequate hydration; -Counseled to monitor BP at home daily, document, and provide log at future appointments -Counseled on diet and exercise extensively Recommended to continue current medication  Hyperlipidemia: (LDL goal < 55) -Not ideally controlled -Current treatment: Atorvastatin 80 mg 1 tablet at bedtime - Appropriate, Query effective, Safe, Accessible -Medications previously tried: fish oil (cardiologist removed this)  -Current dietary patterns: refer to above -Current exercise habits: walking some -Educated on Cholesterol goals;  Benefits of statin for ASCVD risk reduction; Importance of limiting foods high in cholesterol; Exercise goal of 150 minutes per week; -Counseled on diet and exercise extensively Recommended to continue current medication Recommended repeat lipid panel. Consider addition of Zetia if LDL is not < 55.  CAD (Goal: prevent heart events) -Controlled -Current treatment  Aspirin 81 mg 1 tablet daily - Appropriate, Effective, Safe, Accessible Atorvastatin 80 mg 1 tablet at bedtime - Appropriate, Effective, Safe, Accessible Nitroglycerin 0.4 mg SL 1 tablet as needed - Appropriate, Effective, Safe, Accessible -Medications previously tried: none  -Recommended verifying expiration date on nitroglycerin. Requested a refill for medication.  Heart Failure (Goal: manage symptoms and prevent exacerbations) -Controlled -Last ejection fraction: 60-65% (Date: 10/01/19) -HF type: Diastolic -NYHA Class: II (slight limitation of activity) -AHA HF Stage: B (Heart disease present - no symptoms present) -Current treatment: Furosemide 20 mg 1 tablet as needed - Appropriate, Effective, Safe, Accessible Metoprolol succinate 25 mg 1 tablet daily - Appropriate, Effective, Safe, Accessible Jardiance 25 mg 1 tablet daily - Appropriate, Effective, Safe,  Accessible -Medications previously tried: none  -Current home BP/HR readings: refer to above -Current dietary habits:  limits salt intake -Current exercise habits: some walking -Educated on Benefits of medications for managing symptoms and prolonging life Importance of weighing daily; if you gain more than 3 pounds in one day or 5 pounds in one week, call cardiologist Proper diuretic administration and potassium supplementation Importance of blood pressure control -Counseled on diet and exercise extensively Recommended to continue current medication   Diabetes (A1c goal <7%) -Controlled -Current medications: Metformin 500 mg 1 tablet twice daily - Appropriate, Effective, Safe, Accessible Glipizide 5 mg 1 tablet with breakfast - Appropriate, Effective, Safe, Accessible Farxiga 10 mg 1 tablet daily - Appropriate, Effective, Safe, Accessible -Medications previously tried: none  -Current home glucose readings fasting glucose:  109, 155, 98 (twice a day); 2-3 hours after eating   post prandial glucose: does not check -Denies hypoglycemic/hyperglycemic symptoms -Current meal patterns:  breakfast: n/a lunch: n/a dinner: n/a snacks: no snacks; most of the time no desserts; cut back on junk food (individual bags for kids); fig newtons drinks: water -Current exercise: some walking -Educated on A1c and blood sugar goals; Exercise goal of 150 minutes per week; Carbohydrate counting and/or plate method -Counseled to check feet daily and get yearly eye exams -Counseled on diet and exercise extensively  Asthma (Goal: control symptoms) -Uncontrolled -Current treatment  Montelukast 10 mg 1 tablet at bedtime - Appropriate, Effective, Safe, Accessible Symbicort 80 mcg-4.5 mcg/act 2 puffs twice daily - Appropriate, Effective, Safe, Accessible Albuterol HFA as needed - Appropriate, Effective, Safe, Accessible Albuterol nebulizer as needed - Appropriate, Effective, Safe, Accessible -Medications  previously tried:  none  -Pulmonary function testing: 07/2018 -Exacerbations requiring treatment in last 6 months: none -Patient reports consistent use of maintenance inhaler -Frequency of rescue inhaler use: a few times a week -Counseled on Proper inhaler technique; Benefits of consistent maintenance inhaler use When to use rescue inhaler -Counseled on diet and exercise extensively Recommended to continue current medication  Depression/Anxiety (Goal: minimize symptoms) -Not ideally controlled -Current treatment: Wellbutrin XL 150 mg 1 tablet daily - Appropriate, Effective, Safe, Accessible L-methylfolate 7.5 mg daily with food - Appropriate, Effective, Safe, Query accessible -Medications previously tried/failed: several (unknown names) -PHQ9: 1 -GAD7: n/a -Educated on Benefits of medication for symptom control Benefits of cognitive-behavioral therapy with or without medication -Recommended to continue current medication Counseled on possibility of Wellbutrin increasing anxiety.  Insomnia (Goal: improve quality and quantity of sleep) -Not ideally controlled -Current treatment  Doxepin 10 mg 3 capsules at bedtime - Appropriate, Effective, Safe, Query accessible -Medications previously tried: none  -Counseled on practicing good sleep hygiene by setting a sleep schedule and maintaining it, avoid excessive napping, following a nightly routine, avoiding screen time for 30-60 minutes before going to bed, and making the bedroom a cool, quiet and dark space Patient is not napping and not drinking caffeine.  GERD (Goal: minimize symptoms) -Uncontrolled -Current treatment  Nexium 40 mg 1 capsule twice daily - Appropriate, Effective, Safe, Accessible Famotidine 20 mg 1 tablet twice daily as needed - Appropriate, Effective, Safe, Accessible Promethazine 25 mg at bedtime - Appropriate, Effective, Safe, Accessible Sucralfate 1 g - three times a day (2 meals and 1 at bedtime) - Appropriate,  Effective, Safe, Accessible -Medications previously tried: n/a  -Counseled on non-pharmacologic management of symptoms such as elevating the head of your bed, avoiding eating 2-3 hours before bed, avoiding triggering foods such as acidic, spicy, or fatty foods, eating smaller meals, and wearing clothes that are loose around the waist   Hypothyroidism (Goal: 2.5-3.5) -Uncontrolled -Current treatment  Levothyroxine 75 mcg 1 tablet daily - Appropriate, Query effective, Safe, Accessible -Medications previously tried: none  -Counseled on importance of taking on an empty stomach separate from other medications.  Pain (Goal: minimize pain) -Uncontrolled -Current treatment  Lyrica 150 mg 1 capsule three times daily - Appropriate, Effective, Safe, Accessible Oxycodone 20 mg every 4 hours as needed - Appropriate, Effective, Safe, Accessible Tizanidine 4 mg every 8 hours as needed (leg spasms) - Appropriate, Effective, Safe, Accessible -Medications previously tried: none  -Recommended to continue current medication  Allergic rhinitis (Goal: minimize symptoms) -Controlled -Current treatment  Loratadine 10 mg daily as needed - Appropriate, Effective, Safe, Accessible Benadryl as needed - Appropriate, Effective, Query Safe, Accessible Flonase as needed - Appropriate, Effective, Safe, Accessible -Medications previously tried: none  -Recommended to continue current medication   Health Maintenance -Vaccine gaps: shingrix, COVID booster -Current therapy:  Potassium 10 mEq 2 tablets daily Cinnamon daily Clobetasol cream 0.05% as needed Fluticasone nasal spray as needed Glucosoamine chondroitin 2 tablets daily Mometasone 0.1% cream Multivitamin daily Zofran as needed Vitamin B12 500 mcg daily Vitamin E 200 unit capsule Promethazine as needed Apple cider vinegar for stomach Biotin 10000 mcg daily -Educated on Herbal supplement research is limited and benefits usually cannot be proven Cost  vs benefit of each product must be carefully weighed by individual consumer -Patient is satisfied with current therapy and denies issues -Recommended to continue current medication  Patient Goals/Self-Care Activities Patient will:  - take medications as prescribed check glucose daily, document, and provide at future appointments check blood  pressure daily, document, and provide at future appointments weigh daily, and contact provider if weight gain of > 3 lbs in one day or > 5 lbs in one week target a minimum of 150 minutes of moderate intensity exercise weekly  Follow Up Plan: The care management team will reach out to the patient again over the next 14 days.       Medication Assistance:  Wilder Glade and Symbicort obtained through [employer]  medication assistance program.  Enrollment ends 12/02/22  Compliance/Adherence/Medication fill history: Care Gaps: Shingrix (2nd dose)- scheduled for Dec 3rd, COVID booster, PAP smear Last BP - 100/62 on 09/06/2022 Last A1C - 6.5 on 09/06/2022   [facility]: Atorvastatin 80 mg - last filled 10/09/2022 30 DS at [facility] Glipizide 5 mg - last filled 10/09/2022 30 DS at [facility] Lisinopril 5 mg - last filled 10/09/2022 30 DS at [facility] Metformin 500 mg - last filled 10/09/2022 30 DS at [facility] 10 mg  - approved for PAP   Patient's preferred pharmacy is:  [facility] - [place], [place] - [address] [place] [address] Phone: [phone] Fax: [phone]  [id number] [place] [id number]: [phone]: [phone]  Uses pill box? Yes Pt endorses 90% compliance  We discussed: Benefits of medication synchronization, packaging and delivery as well as enhanced pharmacist oversight with [facility]. Patient decided to: Utilize [facility] pharmacy for medication  synchronization, packaging and delivery  Care Plan and Follow Up Patient Decision:  Patient agrees to Care Plan and Follow-up.  Plan: The care management team will reach out to the patient again over the next 14 days.  MJeni Salles PharmD, BAmoPharmacist [place] [facility]

## 2022-10-30 NOTE — Addendum Note (Signed)
Addended by: Farrel Conners on: 10/30/2022 03:32 PM   Modules accepted: Orders

## 2022-10-30 NOTE — Patient Instructions (Signed)
Hi Diann,  It was great to see you again!  Please reach out to me if you have any questions or need anything!  Best, Maddie  [name], PharmD, [facility]  Visit Information   Goals Addressed   None    Patient Care Plan: CCM Pharmacy Care Plan     Problem Identified: Problem: Hypertension, Hyperlipidemia, Diabetes, Coronary Artery Disease, GERD, Asthma, Hypothyroidism, Depression, and Allergic Rhinitis      Long-Range Goal: Patient-Specific Goal   Start Date: 07/17/2021  Expected End Date: 07/17/2022  Recent Progress: On track  Priority: High  Note:   Current Barriers:  Unable to independently afford treatment regimen Unable to independently monitor therapeutic efficacy Unable to achieve control of cholesterol   Pharmacist Clinical Goal(s):  Patient will verbalize ability to afford treatment regimen achieve adherence to monitoring guidelines and medication adherence to achieve therapeutic efficacy achieve control of cholesterol as evidenced by next lipid panel  through collaboration with PharmD and provider.   Interventions: 1:1 collaboration with Farrel Conners, MD regarding development and update of comprehensive plan of care as evidenced by provider attestation and co-signature Inter-disciplinary care team collaboration (see longitudinal plan of care) Comprehensive medication review performed; medication list updated in electronic medical record  Hypertension (BP goal <130/80) -Controlled -Current treatment: Metoprolol succinate 25 mg 1 tablet daily - Appropriate, Effective, Safe, Accessible -Medications previously tried: none  -Current home readings: 130-140s/80-90s; checking once or twice a day -Current dietary habits: doesn't use any salt; chicken and porchops or ribs; no red meat in a while; 1-2 vegetables every day; not much fruit -Current exercise habits: walking some -Reports  hypotensive/hypertensive symptoms -Educated on BP goals and benefits of medications for prevention of heart attack, stroke and kidney damage; Exercise goal of 150 minutes per week; Importance of home blood pressure monitoring; Proper BP monitoring technique; Symptoms of hypotension and importance of maintaining adequate hydration; -Counseled to monitor BP at home daily, document, and provide log at future appointments -Counseled on diet and exercise extensively Recommended to continue current medication  Hyperlipidemia: (LDL goal < 55) -Not ideally controlled -Current treatment: Atorvastatin 80 mg 1 tablet at bedtime - Appropriate, Query effective, Safe, Accessible -Medications previously tried: fish oil (cardiologist removed this)  -Current dietary patterns: refer to above -Current exercise habits: walking some -Educated on Cholesterol goals;  Benefits of statin for ASCVD risk reduction; Importance of limiting foods high in cholesterol; Exercise goal of 150 minutes per week; -Counseled on diet and exercise extensively Recommended to continue current medication Recommended repeat lipid panel. Consider addition of Zetia if LDL is not < 55.  CAD (Goal: prevent heart events) -Controlled -Current treatment  Aspirin 81 mg 1 tablet daily - Appropriate, Effective, Safe, Accessible Atorvastatin 80 mg 1 tablet at bedtime - Appropriate, Effective, Safe, Accessible Nitroglycerin 0.4 mg SL 1 tablet as needed - Appropriate, Effective, Safe, Accessible -Medications previously tried: none  -Recommended verifying expiration date on nitroglycerin. Requested a refill for medication.  Heart Failure (Goal: manage symptoms and prevent exacerbations) -Controlled -Last ejection fraction: 60-65% (Date: 10/01/19) -HF type: Diastolic -NYHA Class: II (slight limitation of activity) -AHA HF Stage: B (Heart disease present - no symptoms present) -Current treatment: Furosemide 20 mg 1 tablet as needed -  Appropriate, Effective, Safe, Accessible Metoprolol succinate 25 mg 1 tablet daily - Appropriate, Effective, Safe, Accessible Jardiance 25 mg 1 tablet daily - Appropriate, Effective, Safe, Accessible -Medications previously tried: none  -Current home BP/HR readings: refer to  above -Current dietary habits:  limits salt intake -Current exercise habits: some walking -Educated on Benefits of medications for managing symptoms and prolonging life Importance of weighing daily; if you gain more than 3 pounds in one day or 5 pounds in one week, call cardiologist Proper diuretic administration and potassium supplementation Importance of blood pressure control -Counseled on diet and exercise extensively Recommended to continue current medication   Diabetes (A1c goal <7%) -Controlled -Current medications: Metformin 500 mg 1 tablet twice daily - Appropriate, Effective, Safe, Accessible Glipizide 5 mg 1 tablet with breakfast - Appropriate, Effective, Safe, Accessible Farxiga 10 mg 1 tablet daily - Appropriate, Effective, Safe, Accessible -Medications previously tried: none  -Current home glucose readings fasting glucose:  109, 155, 98 (twice a day); 2-3 hours after eating   post prandial glucose: does not check -Denies hypoglycemic/hyperglycemic symptoms -Current meal patterns:  breakfast: n/a lunch: n/a dinner: n/a snacks: no snacks; most of the time no desserts; cut back on junk food (individual bags for kids); fig newtons drinks: water -Current exercise: some walking -Educated on A1c and blood sugar goals; Exercise goal of 150 minutes per week; Carbohydrate counting and/or plate method -Counseled to check feet daily and get yearly eye exams -Counseled on diet and exercise extensively  Asthma (Goal: control symptoms) -Uncontrolled -Current treatment  Montelukast 10 mg 1 tablet at bedtime - Appropriate, Effective, Safe, Accessible Symbicort 80 mcg-4.5 mcg/act 2 puffs twice daily -  Appropriate, Effective, Safe, Accessible Albuterol HFA as needed - Appropriate, Effective, Safe, Accessible Albuterol nebulizer as needed - Appropriate, Effective, Safe, Accessible -Medications previously tried: none  -Pulmonary function testing: 07/2018 -Exacerbations requiring treatment in last 6 months: none -Patient reports consistent use of maintenance inhaler -Frequency of rescue inhaler use: a few times a week -Counseled on Proper inhaler technique; Benefits of consistent maintenance inhaler use When to use rescue inhaler -Counseled on diet and exercise extensively Recommended to continue current medication  Depression/Anxiety (Goal: minimize symptoms) -Not ideally controlled -Current treatment: Wellbutrin XL 150 mg 1 tablet daily - Appropriate, Effective, Safe, Accessible L-methylfolate 7.5 mg daily with food - Appropriate, Effective, Safe, Query accessible -Medications previously tried/failed: several (unknown names) -PHQ9: 1 -GAD7: n/a -Educated on Benefits of medication for symptom control Benefits of cognitive-behavioral therapy with or without medication -Recommended to continue current medication Counseled on possibility of Wellbutrin increasing anxiety.  Insomnia (Goal: improve quality and quantity of sleep) -Not ideally controlled -Current treatment  Doxepin 10 mg 3 capsules at bedtime - Appropriate, Effective, Safe, Query accessible -Medications previously tried: none  -Counseled on practicing good sleep hygiene by setting a sleep schedule and maintaining it, avoid excessive napping, following a nightly routine, avoiding screen time for 30-60 minutes before going to bed, and making the bedroom a cool, quiet and dark space Patient is not napping and not drinking caffeine.  GERD (Goal: minimize symptoms) -Uncontrolled -Current treatment  Nexium 40 mg 1 capsule twice daily - Appropriate, Effective, Safe, Accessible Famotidine 20 mg 1 tablet twice daily as needed -  Appropriate, Effective, Safe, Accessible Promethazine 25 mg at bedtime - Appropriate, Effective, Safe, Accessible Sucralfate 1 g - three times a day (2 meals and 1 at bedtime) - Appropriate, Effective, Safe, Accessible -Medications previously tried: n/a  -Counseled on non-pharmacologic management of symptoms such as elevating the head of your bed, avoiding eating 2-3 hours before bed, avoiding triggering foods such as acidic, spicy, or fatty foods, eating smaller meals, and wearing clothes that are loose around the waist   Hypothyroidism (Goal:  2.5-3.5) -Uncontrolled -Current treatment  Levothyroxine 75 mcg 1 tablet daily - Appropriate, Query effective, Safe, Accessible -Medications previously tried: none  -Counseled on importance of taking on an empty stomach separate from other medications.  Pain (Goal: minimize pain) -Uncontrolled -Current treatment  Lyrica 150 mg 1 capsule three times daily - Appropriate, Effective, Safe, Accessible Oxycodone 20 mg every 4 hours as needed - Appropriate, Effective, Safe, Accessible Tizanidine 4 mg every 8 hours as needed (leg spasms) - Appropriate, Effective, Safe, Accessible -Medications previously tried: none  -Recommended to continue current medication  Allergic rhinitis (Goal: minimize symptoms) -Controlled -Current treatment  Loratadine 10 mg daily as needed - Appropriate, Effective, Safe, Accessible Benadryl as needed - Appropriate, Effective, Query Safe, Accessible Flonase as needed - Appropriate, Effective, Safe, Accessible -Medications previously tried: none  -Recommended to continue current medication   Health Maintenance -Vaccine gaps: shingrix, COVID booster -Current therapy:  Potassium 10 mEq 2 tablets daily Cinnamon daily Clobetasol cream 0.05% as needed Fluticasone nasal spray as needed Glucosoamine chondroitin 2 tablets daily Mometasone 0.1% cream Multivitamin daily Zofran as needed Vitamin B12 500 mcg daily Vitamin E  200 unit capsule Promethazine as needed Apple cider vinegar for stomach Biotin 10000 mcg daily -Educated on Herbal supplement research is limited and benefits usually cannot be proven Cost vs benefit of each product must be carefully weighed by individual consumer -Patient is satisfied with current therapy and denies issues -Recommended to continue current medication  Patient Goals/Self-Care Activities Patient will:  - take medications as prescribed check glucose daily, document, and provide at future appointments check blood pressure daily, document, and provide at future appointments weigh daily, and contact provider if weight gain of > 3 lbs in one day or > 5 lbs in one week target a minimum of 150 minutes of moderate intensity exercise weekly  Follow Up Plan: The care management team will reach out to the patient again over the next 14 days.      Patient Care Plan: RN Care Manager Plan of Care  Completed 05/31/2022   Problem Identified: Chronic Disease Management and Care Coordination Needs (DM2, CAD, CHF, Asthma,HTN, HLD, chronic pain) Resolved 05/31/2022  Priority: High     Long-Range Goal: Establish Plan of Care for Chronic Disease Management Needs (DM2, CAD, CHF, Asthma,HTN, HLD, chronic pain) Completed 05/31/2022  Start Date: 01/29/2022  Expected End Date: 01/29/2023  Priority: High  Note:   RNCM case closed goals met Current Barriers:  Knowledge Deficits related to plan of care for management of CHF, CAD, HTN, HLD, DMII, Asthma, and chronic back pain  Care Coordination needs related to Financial constraints related to food and Limited access to food Chronic Disease Management support and education needs related to CHF, CAD, HTN, HLD, DMII, Asthma, and chronic back pain   Financial Constraints  States that she sometimes has difficulty affording food and especially healthy food States she has been feeling good.  States her CBGs have been ranging from 97-130.   States she is  trying to eat healthy and she does not use salt.  States she is walking for about 1 hour a day weather permitting.   States she is going to neuro rehab and it is helping some with her headaches  RNCM Clinical Goal(s):  Patient will verbalize understanding of plan for management of CHF, CAD, HTN, HLD, DMII, Asthma, and chronic back pain  as evidenced by voiced adherence to plan of care verbalize basic understanding of  CHF, CAD, HTN, HLD, DMII, Asthma, and  chronic back pain disease process and self health management plan as evidenced by voiced understanding and teach back take all medications exactly as prescribed and will call provider for medication related questions as evidenced by dispense report and pt verbalization  attend all scheduled medical appointments: Podiatry 06/13/22, Cardiology 03/20/23, Annual Wellness Visit 08/07/22,   as evidenced by medical records demonstrate Improved adherence to prescribed treatment plan for CHF, CAD, HTN, HLD, DMII, Asthma, and chronic back pain as evidenced by readings within limits, voiced adherence to plan of care continue to work with RN Care Manager to address care management and care coordination needs related to  CHF, CAD, HTN, HLD, DMII, Asthma, and chronic back pain as evidenced by adherence to CM Team Scheduled appointments work with pharmacist to address Financial constraints related to food and Limited access to food related toCHF, CAD, HTN, HLD, DMII, Asthma, and chronic back pain  as evidenced by review or EMR and patient or pharmacist report work with community resource care guide to address needs related to  Financial constraints related to food and Limited access to food as evidenced by patient and/or community resource care guide support through collaboration with Consulting civil engineer, provider, and care team.   Interventions: 1:1 collaboration with primary care provider regarding development and update of comprehensive plan of care as evidenced by  provider attestation and co-signature Inter-disciplinary care team collaboration (see longitudinal plan of care) Evaluation of current treatment plan related to  self management and patient's adherence to plan as established by provider   Asthma: (Status:Goal Met.) Long Term Goal Provided patient with basic written and verbal Asthma education on self care/management/and exacerbation prevention Advised patient to track and manage Asthma triggers Provided instruction about proper use of medications used for management of Asthma including inhalers Advised patient to self assesses Asthma action plan zone and make appointment with provider if in the yellow zone for 48 hours without improvement Advised patient to engage in light exercise as tolerated 3-5 days a week to aid in the the management of Asthma   CAD Interventions: (Status:  Goal Met.) Long Term Goal Assessed understanding of CAD diagnosis Medications reviewed including medications utilized in CAD treatment plan Provided education on importance of blood pressure control in management of CAD Counseled on the importance of exercise goals with target of 150 minutes per week Reviewed Importance of taking all medications as prescribed Reviewed Importance of attending all scheduled provider appointments Advised to report any changes in symptoms or exercise tolerance   Heart Failure Interventions:  (Status:  Goal Met.) Long Term Goal Basic overview and discussion of pathophysiology of Heart Failure reviewed Provided education on low sodium diet Reviewed Heart Failure Action Plan in depth and provided written copy Discussed importance of daily weight and advised patient to weigh and record daily Reviewed role of diuretics in prevention of fluid overload and management of heart failure; Discussed the importance of keeping all appointments with provider Provided patient with education about the role of exercise in the management of heart  failure Reinforced s/sx to call provider  Diabetes Interventions:  (Status:  Goal Met.) Long Term Goal Assessed patient's understanding of A1c goal: <7% Provided education to patient about basic DM disease process Reviewed medications with patient and discussed importance of medication adherence Provided patient with written educational materials related to hypo and hyperglycemia and importance of correct treatment Advised patient, providing education and rationale, to check cbg twice a day and record, calling provider for findings outside established parameters Referral  made to pharmacy team for assistance with cost, working with CCM PharmD for cost of Vania Rea  continues process for assistance Review of patient status, including review of consultants reports, relevant laboratory and other test results, and medications completed Reviewed importance of continuing to exercise daily to help lower her blood sugars. Reinforced fasting blood sugar goals of 80-130 and less than 180 1 1/2-2 hours after meals.  Lab Results  Component Value Date   HGBA1C 7.5 (H) 01/25/2022   Hyperlipidemia Interventions:  (Status:  Goal Met.) Long Term Goal Medication review performed; medication list updated in electronic medical record.  Counseled on importance of regular laboratory monitoring as prescribed Reviewed role and benefits of statin for ASCVD risk reduction Reviewed importance of limiting foods high in cholesterol Reviewed exercise goals and target of 150 minutes per week  Hypertension Interventions:  (Status:  Goal Met.) Long Term Goal Last practice recorded BP readings:  BP Readings from Last 3 Encounters:  03/19/22 117/74  02/26/22 132/70  02/05/22 122/82  Most recent eGFR/CrCl: No results found for: EGFR  No components found for: CRCL  Evaluation of current treatment plan related to hypertension self management and patient's adherence to plan as established by provider Provided education to  patient re: stroke prevention, s/s of heart attack and stroke Reviewed medications with patient and discussed importance of compliance Counseled on the importance of exercise goals with target of 150 minutes per week Discussed plans with patient for ongoing care management follow up and provided patient with direct contact information for care management team Advised patient, providing education and rationale, to monitor blood pressure daily and record, calling PCP for findings outside established parameters Provided education on prescribed diet low sodium low CHO heart healthy Discussed complications of poorly controlled blood pressure such as heart disease, stroke, circulatory complications, vision complications, kidney impairment, sexual dysfunction  Pain Interventions:  (Status:  Goal Met.) Long Term Goal Pain assessment performed Medications reviewed Reviewed provider established plan for pain management Discussed importance of adherence to all scheduled medical appointments Counseled on the importance of reporting any/all new or changed pain symptoms or management strategies to pain management provider Advised patient to report to care team affect of pain on daily activities Discussed use of relaxation techniques and/or diversional activities to assist with pain reduction (distraction, imagery, relaxation, massage, acupressure, TENS, heat, and cold application Reviewed with patient prescribed pharmacological and nonpharmacological pain relief strategies Reviewed to keep appointment to  Neuro-rehab  Patient Goals/Self-Care Activities: Take all medications as prescribed Attend all scheduled provider appointments Call pharmacy for medication refills 3-7 days in advance of running out of medications Call provider office for new concerns or questions  call office if I gain more than 2 pounds in one day or 5 pounds in one week keep legs up while sitting watch for swelling in feet, ankles  and legs every day weigh myself daily develop a rescue plan follow rescue plan if symptoms flare-up eat more whole grains, fruits and vegetables, lean meats and healthy fats keep appointment with eye doctor check blood sugar at prescribed times: twice daily and when you have symptoms of low or high blood sugar take the blood sugar log to all doctor visits fill half of plate with vegetables manage portion size wash and dry feet carefully every day check blood pressure daily choose a place to take my blood pressure (home, clinic or office, retail store) write blood pressure results in a log or diary take blood pressure log to all doctor  appointments limit salt intake to 2345m/day call for medicine refill 2 or 3 days before it runs out take all medications exactly as prescribed call doctor with any symptoms you believe are related to your medicine  Follow Up Plan:  The patient has been provided with contact information for the care management team and has been advised to call with any health related questions or concerns.  No further follow up required: RNCM case closed goals met        Patient verbalizes understanding of instructions and care plan provided today and agrees to [place] in [place] [facility] [place] status and patient understanding of how to access instructions and care plan via MyChart confirmed with patient.    The pharmacy team will reach out to the patient again over the next 14 days.   MViona Gilmore [facility]

## 2022-10-31 ENCOUNTER — Telehealth: Payer: Self-pay | Admitting: Pharmacist

## 2022-10-31 NOTE — Chronic Care Management (AMB) (Signed)
Chronic Care Management Pharmacy Assistant   Name: Shelby Anderson  MRN: [id number] DOB: 05-02-1956  Reason for Encounter: Medication Review / Medication Coordination Call   Recent office visits:  None  Recent consult visits:  None  Hospital visits:  None  Medications: Outpatient Encounter Medications as of 10/31/2022  Medication Sig   albuterol (PROVENTIL) (2.5 MG/3ML) 0.083% nebulizer solution Take 3 mLs (2.5 mg total) by nebulization every 6 (six) hours as needed for wheezing or shortness of breath (J45.40).   albuterol (VENTOLIN HFA) 108 (90 Base) MCG/ACT inhaler TAKE 2 PUFFS BY MOUTH EVERY 6 HOURS AS NEEDED FOR WHEEZE OR SHORTNESS OF BREATH   aspirin EC 81 MG tablet Take 81 mg at bedtime by mouth.    atorvastatin (LIPITOR) 80 MG tablet TAKE ONE TABLET BY MOUTH EVERYDAY AT BEDTIME Last refill, patient needs to make appt with PCP for further refills   Blood Glucose Monitoring Suppl (ONE TOUCH ULTRA 2) w/Device KIT Use as directed   budesonide-formoterol (SYMBICORT) 80-4.5 MCG/ACT inhaler INHALE 2 PUFFS INTO THE LUNGS EVERY MORNING AND ANOTHER 2 PUFFS 12 HOURS LATER   budesonide-formoterol (SYMBICORT) 80-4.5 MCG/ACT inhaler Inhale 2 puffs into the lungs every morning and another 2 puffs 12 hours later.   buPROPion (WELLBUTRIN XL) 150 MG 24 hr tablet Take 1 tablet by mouth daily.   Calcium Carbonate-Vitamin D 600-200 MG-UNIT TABS Take 1 tablet by mouth daily.   CINNAMON PO Take 1,000 mg 2 (two) times daily by mouth.   dapagliflozin propanediol (FARXIGA) 10 MG TABS tablet Take 1 tablet (10 mg total) by mouth daily before breakfast.   doxepin (SINEQUAN) 10 MG capsule Take by mouth.   esomeprazole (NEXIUM) 40 MG capsule Take 1 capsule (40 mg total) by mouth 2 (two) times daily.   famotidine (PEPCID) 20 MG tablet TAKE ONE TABLET BY MOUTH TWICE DAILY FOR INDIGESTION / heartburn   fluticasone (FLONASE) 50 MCG/ACT nasal spray Place into the nose.   furosemide (LASIX) 20 MG tablet  TAKE 1 OR 2 TABLETS BY MOUTH EVERY DAY AS DIRECTED   glipiZIDE (GLUCOTROL XL) 5 MG 24 hr tablet TAKE ONE TABLET BY MOUTH EVERY MORNING Last refill, patient needs to make appt with PCP for further refills   glucose blood test strip Use as instructed   hydrALAZINE (APRESOLINE) 25 MG tablet Take 1 tablet (25 mg total) by mouth daily as needed (for BP >140/90 mmHg, up to three times daily).   hydrocortisone 2.5 % cream Apply topically as needed.   isosorbide mononitrate (IMDUR) 60 MG 24 hr tablet TAKE ONE TABLET BY MOUTH EVERY MORNING Last refill, patient needs to make appt with PCP for further refills   L-Methylfolate (DEPLIN) 7.5 MG TABS Take 7.5 mg by mouth daily with breakfast.   Lancets (ONETOUCH DELICA PLUS [id number]) MISC USE TO CHECK BLOOD SUGAR 2 TIMES A DAY   Lancets (ONETOUCH DELICA PLUS [id number]) MISC USE AS DIRECTED   levothyroxine (SYNTHROID) 75 MCG tablet Take 1 tablet (75 mcg total) by mouth daily before breakfast.   lisinopril (ZESTRIL) 5 MG tablet TAKE ONE TABLET BY MOUTH EVERY MORNING   LYRICA 150 MG capsule Take 150 mg by mouth 3 (three) times daily.    metFORMIN (GLUCOPHAGE) 500 MG tablet TAKE ONE TABLET BY MOUTH TWICE DAILY WITH A MEAL Last refill, patient needs to make appt with PCP for refills   metoprolol succinate (TOPROL-XL) 25 MG 24 hr tablet Take 1 tablet (25 mg total) by mouth daily. TAKE WITH OR  IMMEDIATELY FOLLOWING A MEAL.   Misc Natural Products (GLUCOS-CHONDROIT-MSM COMPLEX PO) Take 2 tablets by mouth daily.   mometasone (ELOCON) 0.1 % cream Apply topically.   montelukast (SINGULAIR) 10 MG tablet Take 1 tablet (10 mg total) by mouth at bedtime.   Multiple Vitamin (MULITIVITAMIN WITH MINERALS) TABS Take 1 tablet by mouth daily.   mupirocin cream (BACTROBAN) 2 % Apply once daily to lesions on feet as needed.   nitroGLYCERIN (NITROSTAT) 0.4 MG SL tablet Place 1 tablet (0.4 mg total) under the tongue every 5 (five) minutes as needed for chest pain.   NURTEC 75 MG TBDP  Take 1 each by mouth daily as needed.   ondansetron (ZOFRAN) 4 MG tablet Take 1 tablet (4 mg total) by mouth every 8 (eight) hours as needed for nausea or vomiting.   ONETOUCH ULTRA test strip USE TO CHECK BLOOD SUGAR 2 TIMES A DAY   oxyCODONE (ROXICODONE) 15 MG immediate release tablet Take 15 mg by mouth 5 (five) times daily.   oxyCODONE (ROXICODONE) 15 MG immediate release tablet Take 1 tablet by mouth four times a day   potassium chloride (KLOR-CON) 10 MEQ tablet Take 2 tablets (20 mEq total) by mouth daily.   promethazine (PHENERGAN) 25 MG tablet TAKE 1 TABLET BY MOUTH EVERYDAY AT BEDTIME   tiZANidine (ZANAFLEX) 4 MG tablet Take 4 mg by mouth every 8 (eight) hours as needed for muscle spasms.   vitamin B-12 (CYANOCOBALAMIN) 500 MCG tablet Take 500 mcg by mouth daily.    vitamin E 200 UNIT capsule Take 200 Units by mouth daily.   No facility-administered encounter medications on file as of 10/31/2022.   Reviewed chart for medication changes ahead of medication coordination call. BP Readings from Last 3 Encounters:  09/06/22 100/62  08/07/22 100/60  06/19/22 106/63    Lab Results  Component Value Date   HGBA1C 6.5 (A) 09/06/2022     Patient obtains medications through Adherence Packaging  30 Days    Last adherence delivery included:  Doxepine 10 mg - 3 tablets at bedtime Levothyroxine 75 mcg - 1 tablets before breakfast  Tizanidine 4 mg - 2 tablets at breakfast, lunch and dinner  L-methylfolate 7.5 mg - 1 tablet at breakfast Lyrica 150 mg - 1 tablet at breakfast, lunch and dinner Phenergan 25 mg - 1 tablet at bedtime Montelukast 10 mg - 1 tablet at bedtime Potassium 10 meq - 2 tablets at breakfast Metformin 500 mg - 1 tablet at breakfast and dinner Wellbutrin XL 150 mg - 1 tablet at breakfast Isosorbide 60 mg - 1 tablet at breakfast Metoprolol 25 mg  - 1 tablet at breakfast Atorvastatin 80 mg - 1 tablet at bedtime Lisinopril 5 mg - 1 tablet at breakfast Glipizide 5 mg - 1  tablet at breakfast Pepcid 20 mg - 1 tablet at breakfast and dinner   Patient declined last month: Nexium - she is getting this OTC as needed, it cost less than prescription.     Patient is due for next adherence delivery on: 11/13/2022   Called patient and reviewed medications and coordinated delivery.   This delivery to include: Doxepine 10 mg - 3 tablets at bedtime Levothyroxine 75 mcg - 1 tablets before breakfast  Tizanidine 4 mg - 2 tablets at breakfast, lunch and dinner  L-methylfolate 7.5 mg - 1 tablet at breakfast Lyrica 150 mg - 1 tablet at breakfast, lunch and dinner Phenergan 25 mg - 1 tablet at bedtime Montelukast 10 mg - 1 tablet  at bedtime Potassium 10 meq - 2 tablets at breakfast Metformin 500 mg - 1 tablet at breakfast and dinner Wellbutrin XL 150 mg - 1 tablet at breakfast Isosorbide 60 mg - 1 tablet at breakfast Metoprolol 25 mg  - 1 tablet at breakfast Atorvastatin 80 mg - 1 tablet at bedtime Lisinopril 5 mg - 1 tablet at breakfast Glipizide 5 mg - 1 tablet at breakfast Pepcid 20 mg - 1 tablet at breakfast and dinner   Patient will need a short fill: No short fill needed   Coordinated acute fill: No acute fill needed   Patient declined the following medications:   Unable to confirm delivery date of 11/13/2022, unable to reach patient after several attempts.  Care Gaps: AWV - scheduled 08/12/2023 Last BP - 100/62 on 09/06/2022 Last A1C - 6.5 on 09/06/2022 Covid - overdue Shingrix - overdue Papsmear - postponed  Star Rating Drugs: Atorvastatin 80 mg - last filled 10/09/2022 [facility] Glipizide 5 mg - last filled 10/09/2022 [facility] Lisinopril 5 mg - last filled 10/09/2022 [facility] Metformin 500 mg - last filled 10/09/2022 [facility] Farxiga 10 mg  - no fill history  [place] Pharmacist Assistant [phone]

## 2022-11-01 DIAGNOSIS — E785 Hyperlipidemia, unspecified: Secondary | ICD-10-CM | POA: Diagnosis not present

## 2022-11-01 DIAGNOSIS — E1159 Type 2 diabetes mellitus with other circulatory complications: Secondary | ICD-10-CM | POA: Diagnosis not present

## 2022-11-01 DIAGNOSIS — I110 Hypertensive heart disease with heart failure: Secondary | ICD-10-CM | POA: Diagnosis not present

## 2022-11-01 DIAGNOSIS — Z7984 Long term (current) use of oral hypoglycemic drugs: Secondary | ICD-10-CM

## 2022-11-01 DIAGNOSIS — I2510 Atherosclerotic heart disease of native coronary artery without angina pectoris: Secondary | ICD-10-CM

## 2022-11-01 DIAGNOSIS — J45909 Unspecified asthma, uncomplicated: Secondary | ICD-10-CM | POA: Diagnosis not present

## 2022-11-01 DIAGNOSIS — I5030 Unspecified diastolic (congestive) heart failure: Secondary | ICD-10-CM

## 2022-11-02 ENCOUNTER — Telehealth: Payer: Self-pay | Admitting: *Deleted

## 2022-11-02 NOTE — Patient Outreach (Signed)
  Care Coordination   11/02/2022 Name: Shelby Anderson MRN: [id number] DOB: Jul 02, 1956   Care Coordination Outreach Attempts:  An unsuccessful telephone outreach was attempted today to offer the patient information about available care coordination services as a benefit of their health plan.   Follow Up Plan:  Additional outreach attempts will be made to offer the patient care coordination information and services.   Encounter Outcome:  No Answer   Care Coordination Interventions:  No, not indicated    Raina Mina, RN Care Management Coordinator [place] Office [phone]

## 2022-11-22 DIAGNOSIS — M47816 Spondylosis without myelopathy or radiculopathy, lumbar region: Secondary | ICD-10-CM | POA: Diagnosis not present

## 2022-11-22 DIAGNOSIS — G893 Neoplasm related pain (acute) (chronic): Secondary | ICD-10-CM | POA: Diagnosis not present

## 2022-11-22 DIAGNOSIS — Z79891 Long term (current) use of opiate analgesic: Secondary | ICD-10-CM | POA: Diagnosis not present

## 2022-11-22 DIAGNOSIS — G894 Chronic pain syndrome: Secondary | ICD-10-CM | POA: Diagnosis not present

## 2022-11-23 ENCOUNTER — Telehealth: Payer: Self-pay

## 2022-11-23 NOTE — Progress Notes (Signed)
  Chronic Care Management   Note  11/23/2022 Name: DALISHA SHIVELY MRN: [id number] DOB: [birth year]  KENDALYNN WIDEMAN is a 66 y.o. year old female who is a primary care patient of Farrel Conners, MD. I reached out to [employer] by phone today in response to a referral sent by Ms. [name]'s PCP.  Ms. Creekmore was given information about Chronic Care Management services today including:  CCM service includes personalized support from designated clinical staff supervised by the physician, including individualized plan of care and coordination with other care providers 24/7 contact phone numbers for assistance for urgent and routine care needs. Service will only be billed when office clinical staff spend 20 minutes or more in a month to coordinate care. Only one practitioner may furnish and bill the service in a calendar month. The patient may stop CCM services at amy time (effective at the end of the month) by phone call to the office staff. The patient will be responsible for cost sharing (co-pay) or up to 20% of the service fee (after annual deductible is met)  Ms. MYKELL GENAO  agreedto scheduling an appointment with the CCM RN Case Manager   Follow up plan: Patient agreed to scheduled appointment with RN Case Manager on 12/17/2022(date/time).   Noreene Larsson, [place], [place] [address] Direct Dial: [phone] [email or web address]

## 2022-11-26 ENCOUNTER — Other Ambulatory Visit: Payer: Self-pay | Admitting: Internal Medicine

## 2022-11-29 ENCOUNTER — Other Ambulatory Visit: Payer: Self-pay | Admitting: Internal Medicine

## 2022-11-29 ENCOUNTER — Other Ambulatory Visit: Payer: Self-pay | Admitting: Family Medicine

## 2022-11-30 ENCOUNTER — Telehealth: Payer: Self-pay | Admitting: Pharmacist

## 2022-11-30 NOTE — Chronic Care Management (AMB) (Signed)
Chronic Care Management Pharmacy Assistant   Name: Shelby Anderson  MRN: [id number] DOB: 01/26/1956  Reason for Encounter: Medication Review / Medication Coordination Call   Recent office visits:  None  Recent consult visits:  None  Hospital visits:  None  Medications: Outpatient Encounter Medications as of 11/30/2022  Medication Sig   albuterol (PROVENTIL) (2.5 MG/3ML) 0.083% nebulizer solution Take 3 mLs (2.5 mg total) by nebulization every 6 (six) hours as needed for wheezing or shortness of breath (J45.40).   albuterol (VENTOLIN HFA) 108 (90 Base) MCG/ACT inhaler TAKE 2 PUFFS BY MOUTH EVERY 6 HOURS AS NEEDED FOR WHEEZE OR SHORTNESS OF BREATH   aspirin EC 81 MG tablet Take 81 mg at bedtime by mouth.    atorvastatin (LIPITOR) 80 MG tablet TAKE ONE TABLET BY MOUTH EVERYDAY AT BEDTIME Last refill, patient needs to make appt with PCP for further refills   Blood Glucose Monitoring Suppl (ONE TOUCH ULTRA 2) w/Device KIT Use as directed   budesonide-formoterol (SYMBICORT) 80-4.5 MCG/ACT inhaler INHALE 2 PUFFS INTO THE LUNGS EVERY MORNING AND ANOTHER 2 PUFFS 12 HOURS LATER   budesonide-formoterol (SYMBICORT) 80-4.5 MCG/ACT inhaler Inhale 2 puffs into the lungs every morning and another 2 puffs 12 hours later.   buPROPion (WELLBUTRIN XL) 150 MG 24 hr tablet Take 1 tablet by mouth daily.   Calcium Carbonate-Vitamin D 600-200 MG-UNIT TABS Take 1 tablet by mouth daily.   CINNAMON PO Take 1,000 mg 2 (two) times daily by mouth.   dapagliflozin propanediol (FARXIGA) 10 MG TABS tablet Take 1 tablet (10 mg total) by mouth daily before breakfast.   doxepin (SINEQUAN) 10 MG capsule Take by mouth.   esomeprazole (NEXIUM) 40 MG capsule Take 1 capsule (40 mg total) by mouth 2 (two) times daily. Office visit for further refills   famotidine (PEPCID) 20 MG tablet TAKE ONE TABLET BY MOUTH TWICE DAILY FOR INDIGESTION / heartburn   fluticasone (FLONASE) 50 MCG/ACT nasal spray Place into the nose.    furosemide (LASIX) 20 MG tablet TAKE 1 OR 2 TABLETS BY MOUTH EVERY DAY AS DIRECTED   glipiZIDE (GLUCOTROL XL) 5 MG 24 hr tablet TAKE ONE TABLET BY MOUTH EVERY MORNING Last refill, patient needs to make appt with PCP for further refills   glucose blood test strip Use as instructed   hydrALAZINE (APRESOLINE) 25 MG tablet Take 1 tablet (25 mg total) by mouth daily as needed (for BP >140/90 mmHg, up to three times daily).   hydrocortisone 2.5 % cream Apply topically as needed.   isosorbide mononitrate (IMDUR) 60 MG 24 hr tablet TAKE ONE TABLET BY MOUTH EVERY MORNING Last refill, patient needs to make appt with PCP for further refills   L-Methylfolate (DEPLIN) 7.5 MG TABS Take 7.5 mg by mouth daily with breakfast.   Lancets (ONETOUCH DELICA PLUS [id number]) MISC USE TO CHECK BLOOD SUGAR 2 TIMES A DAY   Lancets (ONETOUCH DELICA PLUS [id number]) MISC USE AS DIRECTED   levothyroxine (SYNTHROID) 75 MCG tablet Take 1 tablet (75 mcg total) by mouth daily before breakfast.   lisinopril (ZESTRIL) 5 MG tablet TAKE ONE TABLET BY MOUTH EVERY MORNING   LYRICA 150 MG capsule Take 150 mg by mouth 3 (three) times daily.    metFORMIN (GLUCOPHAGE) 500 MG tablet TAKE ONE TABLET BY MOUTH TWICE DAILY WITH A MEAL Last refill, patient needs to make appt with PCP for refills   metoprolol succinate (TOPROL-XL) 25 MG 24 hr tablet Take 1 tablet (25 mg total) by  mouth daily. TAKE WITH OR IMMEDIATELY FOLLOWING A MEAL.   Misc Natural Products (GLUCOS-CHONDROIT-MSM COMPLEX PO) Take 2 tablets by mouth daily.   mometasone (ELOCON) 0.1 % cream Apply topically.   montelukast (SINGULAIR) 10 MG tablet Take 1 tablet (10 mg total) by mouth at bedtime.   Multiple Vitamin (MULITIVITAMIN WITH MINERALS) TABS Take 1 tablet by mouth daily.   mupirocin cream (BACTROBAN) 2 % Apply once daily to lesions on feet as needed.   nitroGLYCERIN (NITROSTAT) 0.4 MG SL tablet Place 1 tablet (0.4 mg total) under the tongue every 5 (five) minutes as needed  for chest pain.   NURTEC 75 MG TBDP Take 1 each by mouth daily as needed.   ondansetron (ZOFRAN) 4 MG tablet Take 1 tablet (4 mg total) by mouth every 8 (eight) hours as needed for nausea or vomiting.   ONETOUCH ULTRA test strip USE TO CHECK BLOOD SUGAR 2 TIMES A DAY   oxyCODONE (ROXICODONE) 15 MG immediate release tablet Take 15 mg by mouth 5 (five) times daily.   oxyCODONE (ROXICODONE) 15 MG immediate release tablet Take 1 tablet by mouth four times a day   potassium chloride (KLOR-CON) 10 MEQ tablet Take 2 tablets (20 mEq total) by mouth daily.   promethazine (PHENERGAN) 25 MG tablet TAKE ONE TABLET BY MOUTH EVERYDAY AT BEDTIME   tiZANidine (ZANAFLEX) 4 MG tablet Take 4 mg by mouth every 8 (eight) hours as needed for muscle spasms.   vitamin B-12 (CYANOCOBALAMIN) 500 MCG tablet Take 500 mcg by mouth daily.    vitamin E 200 UNIT capsule Take 200 Units by mouth daily.   No facility-administered encounter medications on file as of 11/30/2022.   Reviewed chart for medication changes ahead of medication coordination call.  BP Readings from Last 3 Encounters:  09/06/22 100/62  08/07/22 100/60  06/19/22 106/63    Lab Results  Component Value Date   HGBA1C 6.5 (A) 09/06/2022     Patient obtains medications through Adherence Packaging  30 Days    Last adherence delivery included:  Doxepine 10 mg - 3 tablets at bedtime Levothyroxine 75 mcg - 1 tablets before breakfast  Tizanidine 4 mg - 2 tablets at breakfast, lunch and dinner  L-methylfolate 7.5 mg - 1 tablet at breakfast Lyrica 150 mg - 1 tablet at breakfast, lunch and dinner Phenergan 25 mg - 1 tablet at bedtime Montelukast 10 mg - 1 tablet at bedtime Potassium 10 meq - 2 tablets at breakfast Metformin 500 mg - 1 tablet at breakfast and dinner Wellbutrin XL 150 mg - 1 tablet at breakfast Isosorbide 60 mg - 1 tablet at breakfast Metoprolol 25 mg  - 1 tablet at breakfast Atorvastatin 80 mg - 1 tablet at bedtime Lisinopril 5 mg - 1  tablet at breakfast Glipizide 5 mg - 1 tablet at breakfast Pepcid 20 mg - 1 tablet at breakfast and dinner   Patient declined last month: Nexium - she is getting this OTC as needed, it cost less than prescription.     Patient is due for next adherence delivery on: 12/12/2022   Called patient and reviewed medications and coordinated delivery.   This delivery to include: Doxepine 10 mg - 3 tablets at bedtime Levothyroxine 75 mcg - 1 tablets before breakfast  Tizanidine 4 mg - 2 tablets at breakfast, lunch and dinner  L-methylfolate 7.5 mg - 1 tablet at breakfast Lyrica 150 mg - 1 tablet at breakfast, lunch and dinner Phenergan 25 mg - 1 tablet at bedtime  Montelukast 10 mg - 1 tablet at bedtime Potassium 10 meq - 2 tablets at breakfast Metformin 500 mg - 1 tablet at breakfast and dinner Wellbutrin XL 150 mg - 1 tablet at breakfast Isosorbide 60 mg - 1 tablet at breakfast Metoprolol 25 mg  - 1 tablet at breakfast Atorvastatin 80 mg - 1 tablet at bedtime Lisinopril 5 mg - 1 tablet at breakfast Glipizide 5 mg - 1 tablet at breakfast Pepcid 20 mg - 1 tablet at breakfast and dinner   Patient will need a short fill: No short fill needed   Coordinated acute fill: No acute fill needed   Patient declined the following medications:   Nexium - she is getting this OTC as needed, it cost less than prescription.    Unable to confirm delivery date of 12/12/2022, unable to reach patient.   Care Gaps: AWV - scheduled 08/12/2023 Last BP - 100/62 on 09/06/2022 Last A1C - 6.5 on 09/06/2022 Covid - overdue Shingrix - overdue Pap smear - postponed  Star Rating Drugs: Atorvastatin 80 mg - last filled 11/08/2022 30 [facility] Glipizide 5 mg - last filled 11/08/2022 [facility] Lisinopril 5 mg - last filled 11/08/2022 30 [facility] Metformin 500 mg - last filled 11/08/2022 30 [facility] Farxiga 10 mg  - no fill history  [place] Pharmacist  Assistant [phone]

## 2022-12-17 ENCOUNTER — Ambulatory Visit (INDEPENDENT_AMBULATORY_CARE_PROVIDER_SITE_OTHER): Payer: Medicare Other

## 2022-12-17 DIAGNOSIS — E119 Type 2 diabetes mellitus without complications: Secondary | ICD-10-CM

## 2022-12-17 DIAGNOSIS — I10 Essential (primary) hypertension: Secondary | ICD-10-CM

## 2022-12-17 NOTE — Patient Instructions (Addendum)
Thank you for allowing the Chronic Care Management team to participate in your care. It was great speaking with you today!  Our next outreach is scheduled for February 09, 2023 at 3:00 pm. Please do not hesitate to call if you require assistance prior to our next outreach. Please call the care guide team at [phone] if you need to cancel or reschedule your appointment.    Following is a copy of the CCM Program Consent:  CCM service includes personalized support from designated clinical staff supervised by the physician, including individualized plan of care and coordination with other care providers 24/7 contact phone numbers for assistance for urgent and routine care needs. Service will only be billed when office clinical staff spend 20 minutes or more in a month to coordinate care. Only one practitioner may furnish and bill the service in a calendar month. The patient may stop CCM services at amy time (effective at the end of the month) by phone call to the office staff. The patient will be responsible for cost sharing (co-pay) or up to 20% of the service fee (after annual deductible is met)  Following is a copy of your full provider care plan:   Goals Addressed             This Visit's Progress    Goal: CCM (Diabetes) Expected Outcome:  Monitor, Self-Manage And Reduce Symptoms of Diabetes       Current Barriers:  Chronic Disease Management support and education needs related to Diabetes   Planned Interventions: Discussed plan for Diabetes management. Reviewed medications and discussed importance of medication adherence. Reports taking all medications as prescribed. Denies concerns related to medication management. Discussed importance of consistent blood glucose monitoring. Reports monitoring twice a day. Reports fasting readings have been within range. Reports evening readings have ranged in the 120s to 150s. Fasting reading today was 87 mg/dl.  Discussed information regarding  s/sx of hypoglycemia and hyperglycemia along with recommended interventions. Denies recent hypoglycemic or hyperglycemic episodes.  Discussed nutritional intake and importance of complying with a diabetic diet. Discussed importance of consuming vegetables, fruits, and lean proteins. Advised to monitor intake of carbohydrates and avoid foods and beverages with added sugar. Declines current need for additional nutritional resources. Discussed importance of completing recommended DM preventive care. Reports completing daily foot care. Completing foot exams with Podiatry/Dr. Adah Perl every three months. Reports completing eye exams annually. Reports eye exam is up to date. Discussed importance of completing ordered labs as prescribed.  Assessed social determinant of health barriers.    Lab Results  Component Value Date   HGBA1C 6.5 (A) 09/06/2022    Symptom Management: Take medications as prescribed   Attend all scheduled provider appointments Call pharmacy for medication refills 3-7 days in advance of running out of medications Continue monitoring blood sugars twice a day Keep a blood sugar log Check feet daily for cuts, sores or redness Wash and dry feet carefully every day Wear comfortable, cotton socks Wear comfortable, well-fitting shoes Read food labels for fat, fiber, carbohydrates and portion size Call provider office for new concerns or questions    Follow Up Plan:  Will follow up in March         Goal: CCM (Hypertension) Expected Outcome:  Monitor, Self-Manage And Reduce Symptoms of Hypertension       Current Barriers:  Chronic Disease Management support and education needs related to Hypertension  Planned Interventions: Reviewed current treatment plan related to Hypertension, self-management, and adherence to plan as established  by provider.  Reviewed medications and indications for use. Reviewed instructions and BP ranges for administering hydralazine. Reports taking all  medications as prescribed. Provided information regarding established blood pressure parameters along with indications for notifying a provider. Reports not monitoring consistently d/t needing a new BP monitor. Advised to monitor daily once new cuff is obtained. Advised to record readings. Reviewed symptoms. Reports history of angina and palpitations. Denies headaches, dizziness, or visual changes. Thorough discussion regarding worsening symptoms that require immediate medical attention. Discussed compliance with recommended cardiac prudent diet. Encouraged to read nutrition labels, monitor, sodium intake, and avoid highly processed foods when possible.  Reviewed s/sx of heart attack, stroke and worsening symptoms that require immediate medical attention.    BP Readings from Last 3 Encounters:  09/06/22 100/62  08/07/22 100/60  06/19/22 106/63     Symptom Management: Take all medications as prescribed Attend all scheduled provider appointments Call pharmacy for medication refills 3-7 days in advance of running out of medications Check blood pressure and record readings Engage in low impact activity as tolerated Eat whole grains, fruit, and vegetables, lean meats and healthy fats Continue limiting sodium intake Contact provider with new questions and concerns   Follow Up Plan:  Will follow up in March           [name] verbalizes understanding of instructions and care plan provided today. Agrees to view in MyChart.   A member of the care management team will follow up in March.    Horris Latino RN Care Manager/Chronic Care Management [phone]

## 2022-12-17 NOTE — Chronic Care Management (AMB) (Signed)
Chronic Care Management   CCM RN Visit Note  12/17/2022 Name: Shelby Anderson MRN: [id number] DOB: [birth year]  Subjective: Shelby Anderson is a 67 y.o. year old female who is a primary care patient of Farrel Conners, MD. The patient was referred to the Chronic Care Management team for assistance with care management needs subsequent to provider initiation of CCM services and plan of care.    Today's Visit:  Engaged with patient by telephone for initial visit.     SDOH Interventions Today    Flowsheet Row Most Recent Value  SDOH Interventions   Food Insecurity Interventions Intervention Not Indicated  Housing Interventions Intervention Not Indicated  Transportation Interventions Intervention Not Indicated  Utilities Interventions Intervention Not Indicated  Alcohol Usage Interventions Intervention Not Indicated (Score <7)  Financial Strain Interventions Other (Comment)  [Declines current need for resources]  Stress Interventions Intervention Not Indicated  Social Connections Interventions Intervention Not Indicated         Goals Addressed             This Visit's Progress    Goal: CCM (Diabetes) Expected Outcome:  Monitor, Self-Manage And Reduce Symptoms of Diabetes       Current Barriers:  Chronic Disease Management support and education needs related to Diabetes   Planned Interventions: Discussed plan for Diabetes management. Reviewed medications and discussed importance of medication adherence. Reports taking all medications as prescribed. Denies concerns related to medication management. Discussed importance of consistent blood glucose monitoring. Reports monitoring twice a day. Reports fasting readings have been within range. Reports evening readings have ranged in the 120s to 150s. Fasting reading today was 87 mg/dl.  Discussed information regarding s/sx of hypoglycemia and hyperglycemia along with recommended interventions. Denies recent hypoglycemic or  hyperglycemic episodes.  Discussed nutritional intake and importance of complying with a diabetic diet. Discussed importance of consuming vegetables, fruits, and lean proteins. Advised to monitor intake of carbohydrates and avoid foods and beverages with added sugar. Declines current need for additional nutritional resources. Discussed importance of completing recommended DM preventive care. Reports completing daily foot care. Completing foot exams with Podiatry/Dr. Adah Perl every three months. Reports completing eye exams annually. Reports eye exam is up to date. Discussed importance of completing ordered labs as prescribed.  Assessed social determinant of health barriers.    Lab Results  Component Value Date   HGBA1C 6.5 (A) 09/06/2022    Symptom Management: Take medications as prescribed   Attend all scheduled provider appointments Call pharmacy for medication refills 3-7 days in advance of running out of medications Continue monitoring blood sugars twice a day Keep a blood sugar log Check feet daily for cuts, sores or redness Wash and dry feet carefully every day Wear comfortable, cotton socks Wear comfortable, well-fitting shoes Read food labels for fat, fiber, carbohydrates and portion size Call provider office for new concerns or questions    Follow Up Plan:  Will follow up in March         Goal: CCM (Hypertension) Expected Outcome:  Monitor, Self-Manage And Reduce Symptoms of Hypertension       Current Barriers:  Chronic Disease Management support and education needs related to Hypertension  Planned Interventions: Reviewed current treatment plan related to Hypertension, self-management, and adherence to plan as established by provider.  Reviewed medications and indications for use. Reviewed instructions and BP ranges for administering hydralazine. Reports taking all medications as prescribed. Provided information regarding established blood pressure parameters along  with indications for notifying a  provider. Reports not monitoring consistently d/t needing a new BP monitor. Advised to monitor daily once new cuff is obtained. Advised to record readings. Reviewed symptoms. Reports history of angina and palpitations. Denies headaches, dizziness, or visual changes. Thorough discussion regarding worsening symptoms that require immediate medical attention. Discussed compliance with recommended cardiac prudent diet. Encouraged to read nutrition labels, monitor, sodium intake, and avoid highly processed foods when possible.  Reviewed s/sx of heart attack, stroke and worsening symptoms that require immediate medical attention.    BP Readings from Last 3 Encounters:  09/06/22 100/62  08/07/22 100/60  06/19/22 106/63     Symptom Management: Take all medications as prescribed Attend all scheduled provider appointments Call pharmacy for medication refills 3-7 days in advance of running out of medications Check blood pressure and record readings Engage in low impact activity as tolerated Eat whole grains, fruit, and vegetables, lean meats and healthy fats Continue limiting sodium intake Contact provider with new questions and concerns   Follow Up Plan:  Will follow up in March            PLAN Will follow up in March   [place] Manager/Chronic Care Management [phone]

## 2022-12-17 NOTE — Plan of Care (Signed)
Chronic Care Management Provider Comprehensive Care Plan    12/17/2022 Name: Shelby Anderson MRN: [id number] DOB: Jan 05, 1956  Referral to Chronic Care Management (CCM) services was placed by Provider:  Farrel Conners, MD on Date: 10/30/22.  Chronic Condition 1: HTN Provider Assessment and Plan  HTN (hypertension), benign (Chronic)       Current hypertension medications:         Sig    furosemide (LASIX) 20 MG tablet (Taking) TAKE 1 OR 2 TABLETS BY MOUTH EVERY DAY AS DIRECTED    lisinopril (ZESTRIL) 5 MG tablet (Taking) Take 1 tablet (5 mg total) by mouth daily.    metoprolol succinate (TOPROL-XL) 25 MG 24 hr tablet (Taking) Take 1 tablet (25 mg total) by mouth daily. TAKE WITH OR IMMEDIATELY FOLLOWING A MEAL.    hydrALAZINE (APRESOLINE) 25 MG tablet Take 1 tablet (25 mg total) by mouth daily as needed (for BP >140/90 mmHg, up to three times daily).  BP performed today in office, well controlled, will continue the above listed medications as prescribed.      Expected Outcome/Goals Addressed This Visit (Provider CCM goals/Provider Assessment and plan  Goal: CCM (Hypertension) Expected Outcome:  Monitor, Self-Manage And Reduce Symptoms of Hypertension  Symptom Management Condition 1: Take all medications as prescribed Attend all scheduled provider appointments Call pharmacy for medication refills 3-7 days in advance of running out of medications Check blood pressure and record readings Engage in low impact activity as tolerated Eat whole grains, fruit, and vegetables, lean meats and healthy fats Continue limiting sodium intake Contact provider with new questions and concerns     Chronic Condition 2: DM Provider Assessment and Plan  Type 2 diabetes mellitus without complication, without long-term current use of insulin (HCC) - Primary (Chronic)       A1C performed in office today, 6.5 which is well controlled.       Expected Outcome/Goals Addressed This Visit  (Provider CCM goals/Provider Assessment and Plan  Goal: CCM (Diabetes) Expected Outcome:  Monitor, Self-Manage And Reduce Symptoms of Diabetes  Symptom Management Condition 2: Take medications as prescribed   Attend all scheduled provider appointments Call pharmacy for medication refills 3-7 days in advance of running out of medications Continue monitoring blood sugars twice a day Keep a blood sugar log Check feet daily for cuts, sores or redness Wash and dry feet carefully every day Wear comfortable, cotton socks Wear comfortable, well-fitting shoes Read food labels for fat, fiber, carbohydrates and portion size Call provider office for new concerns or questions     Problem List Patient Active Problem List   Diagnosis Date Noted   Hypothyroidism 09/11/2022   Dental caries 06/12/2022   Spondylosis without myelopathy or radiculopathy, cervical region 04/21/2021   Neutropenia with fever ([place]) 10/01/2019   Genetic testing 09/17/2019   Family history of melanoma    Family history of pancreatic cancer    Malignant neoplasm of upper-outer quadrant of left breast in female, estrogen receptor negative ([place]) 09/07/2019   Encounter for loop recorder at end of battery life 04/24/2018   Esophageal stricture 07/01/2017   Hyperlipidemia associated with type 2 diabetes mellitus ([place]) 05/20/2017   Allergic rhinitis 03/11/2017   Dilated cardiomyopathy ([place]) 02/07/2017   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 11/13/2016   Morbid obesity due to excess calories ([place]) 09/26/2016   Increased endometrial stripe thickness 06/27/2016   Intramural leiomyoma of uterus 06/27/2016   Type 2 diabetes mellitus without complication, without long-term current use of  insulin ([place]) 12/06/2015   HTN (hypertension), benign 07/19/2015   Arrhythmia 07/19/2015   Orthostatic hypotension 07/19/2015   GERD (gastroesophageal reflux disease) 07/19/2015   Chronic back pain 07/19/2015   Neuropathy 07/19/2015    Symptomatic PVCs 11/02/2014   Syncope 11/02/2014   Chest pain 12/23/2013   Disorder of cervix 03/10/2013   Vaginal atrophy 03/10/2013   OSA (obstructive sleep apnea) 07/31/2012   Asthma 04/29/2012   Coronary artery disease 11/20/2011    Medication Management  Current Outpatient Medications:    albuterol (PROVENTIL) (2.5 MG/3ML) 0.083% nebulizer solution, Take 3 mLs (2.5 mg total) by nebulization every 6 (six) hours as needed for wheezing or shortness of breath (J45.40)., Disp: 75 mL, Rfl: 3   albuterol (VENTOLIN HFA) 108 (90 Base) MCG/ACT inhaler, TAKE 2 PUFFS BY MOUTH EVERY 6 HOURS AS NEEDED FOR WHEEZE OR SHORTNESS OF BREATH, Disp: 20.1 each, Rfl: 3   aspirin EC 81 MG tablet, Take 81 mg at bedtime by mouth. , Disp: , Rfl:    atorvastatin (LIPITOR) 80 MG tablet, TAKE ONE TABLET BY MOUTH EVERYDAY AT BEDTIME Last refill, patient needs to make appt with PCP for further refills, Disp: 90 tablet, Rfl: 0   Blood Glucose Monitoring Suppl (ONE TOUCH ULTRA 2) w/Device KIT, Use as directed, Disp: 1 kit, Rfl: 1   budesonide-formoterol (SYMBICORT) 80-4.5 MCG/ACT inhaler, INHALE 2 PUFFS INTO THE LUNGS EVERY MORNING AND ANOTHER 2 PUFFS 12 HOURS LATER, Disp: 10.2 g, Rfl: 3   budesonide-formoterol (SYMBICORT) 80-4.5 MCG/ACT inhaler, Inhale 2 puffs into the lungs every morning and another 2 puffs 12 hours later., Disp: 3 each, Rfl: 3   buPROPion (WELLBUTRIN XL) 150 MG 24 hr tablet, Take 1 tablet by mouth daily., Disp: , Rfl:    Calcium Carbonate-Vitamin D 600-200 MG-UNIT TABS, Take 1 tablet by mouth daily., Disp: , Rfl:    CINNAMON PO, Take 1,000 mg 2 (two) times daily by mouth., Disp: , Rfl:    dapagliflozin propanediol (FARXIGA) 10 MG TABS tablet, Take 1 tablet (10 mg total) by mouth daily before breakfast., Disp: 90 tablet, Rfl: 3   doxepin (SINEQUAN) 10 MG capsule, Take by mouth., Disp: , Rfl:    esomeprazole (NEXIUM) 40 MG capsule, Take 1 capsule (40 mg total) by mouth 2 (two) times daily. Office visit  for further refills, Disp: 180 capsule, Rfl: 0   famotidine (PEPCID) 20 MG tablet, TAKE ONE TABLET BY MOUTH TWICE DAILY FOR INDIGESTION / heartburn, Disp: 180 tablet, Rfl: 1   fluticasone (FLONASE) 50 MCG/ACT nasal spray, Place into the nose., Disp: , Rfl:    furosemide (LASIX) 20 MG tablet, TAKE 1 OR 2 TABLETS BY MOUTH EVERY DAY AS DIRECTED, Disp: 180 tablet, Rfl: 1   glipiZIDE (GLUCOTROL XL) 5 MG 24 hr tablet, TAKE ONE TABLET BY MOUTH EVERY MORNING Last refill, patient needs to make appt with PCP for further refills, Disp: 90 tablet, Rfl: 0   glucose blood test strip, Use as instructed, Disp: 100 each, Rfl: 3   hydrALAZINE (APRESOLINE) 25 MG tablet, Take 1 tablet (25 mg total) by mouth daily as needed (for BP >140/90 mmHg, up to three times daily)., Disp: 30 tablet, Rfl: 3   hydrocortisone 2.5 % cream, Apply topically as needed., Disp: , Rfl:    isosorbide mononitrate (IMDUR) 60 MG 24 hr tablet, TAKE ONE TABLET BY MOUTH EVERY MORNING Last refill, patient needs to make appt with PCP for further refills, Disp: 90 tablet, Rfl: 0   L-Methylfolate (DEPLIN) 7.5 MG TABS,  Take 7.5 mg by mouth daily with breakfast., Disp: , Rfl:    Lancets (ONETOUCH DELICA PLUS [id number]) MISC, USE TO CHECK BLOOD SUGAR 2 TIMES A DAY, Disp: 100 each, Rfl: 1   Lancets (ONETOUCH DELICA PLUS [id number]) MISC, USE AS DIRECTED, Disp: 100 each, Rfl: 1   levothyroxine (SYNTHROID) 75 MCG tablet, Take 1 tablet (75 mcg total) by mouth daily before breakfast., Disp: 90 tablet, Rfl: 3   lisinopril (ZESTRIL) 5 MG tablet, TAKE ONE TABLET BY MOUTH EVERY MORNING, Disp: 90 tablet, Rfl: 1   LYRICA 150 MG capsule, Take 150 mg by mouth 3 (three) times daily. , Disp: , Rfl: 2   metFORMIN (GLUCOPHAGE) 500 MG tablet, TAKE ONE TABLET BY MOUTH TWICE DAILY WITH A MEAL Last refill, patient needs to make appt with PCP for refills, Disp: 180 tablet, Rfl: 0   metoprolol succinate (TOPROL-XL) 25 MG 24 hr tablet, Take 1 tablet (25 mg total) by mouth daily.  TAKE WITH OR IMMEDIATELY FOLLOWING A MEAL., Disp: 90 tablet, Rfl: 3   Misc Natural Products (GLUCOS-CHONDROIT-MSM COMPLEX PO), Take 2 tablets by mouth daily., Disp: , Rfl:    mometasone (ELOCON) 0.1 % cream, Apply topically., Disp: , Rfl:    montelukast (SINGULAIR) 10 MG tablet, Take 1 tablet (10 mg total) by mouth at bedtime., Disp: 90 tablet, Rfl: 3   Multiple Vitamin (MULITIVITAMIN WITH MINERALS) TABS, Take 1 tablet by mouth daily., Disp: , Rfl:    mupirocin cream (BACTROBAN) 2 %, Apply once daily to lesions on feet as needed., Disp: 30 g, Rfl: 1   nitroGLYCERIN (NITROSTAT) 0.4 MG SL tablet, Place 1 tablet (0.4 mg total) under the tongue every 5 (five) minutes as needed for chest pain., Disp: 10 tablet, Rfl: 2   NURTEC 75 MG TBDP, Take 1 each by mouth daily as needed., Disp: 2 tablet, Rfl: 0   ondansetron (ZOFRAN) 4 MG tablet, Take 1 tablet (4 mg total) by mouth every 8 (eight) hours as needed for nausea or vomiting., Disp: 80 tablet, Rfl: 2   ONETOUCH ULTRA test strip, USE TO CHECK BLOOD SUGAR 2 TIMES A DAY, Disp: 100 strip, Rfl: 3   oxyCODONE (ROXICODONE) 15 MG immediate release tablet, Take 15 mg by mouth 5 (five) times daily., Disp: , Rfl:    oxyCODONE (ROXICODONE) 15 MG immediate release tablet, Take 1 tablet by mouth four times a day, Disp: 120 tablet, Rfl: 0   potassium chloride (KLOR-CON) 10 MEQ tablet, TAKE TWO TABLETS BY MOUTH EVERY MORNING, Disp: 180 tablet, Rfl: 1   promethazine (PHENERGAN) 25 MG tablet, TAKE ONE TABLET BY MOUTH EVERYDAY AT BEDTIME, Disp: 90 tablet, Rfl: 3   tiZANidine (ZANAFLEX) 4 MG tablet, Take 4 mg by mouth every 8 (eight) hours as needed for muscle spasms., Disp: , Rfl:    vitamin B-12 (CYANOCOBALAMIN) 500 MCG tablet, Take 500 mcg by mouth daily. , Disp: , Rfl:    vitamin E 200 UNIT capsule, Take 200 Units by mouth daily., Disp: , Rfl:   Cognitive Assessment Identity Confirmed: : Name; DOB Cognitive Status: Normal   Functional Assessment Hearing Difficulty  or Deaf: no Hearing Management: some hearing loss Wear Glasses or Blind: yes Vision Management: Reading glasses only Concentrating, Remembering or Making Decisions Difficulty (CP): no Difficulty Communicating: no Difficulty Eating/Swallowing: no (Reports having dilation last year/Will follow up with GI) Eating/Swallowing Management: sometimes needs esophagus dilitated cuts up food Walking or Climbing Stairs Difficulty: yes Walking or Climbing Stairs: ambulation difficulty, requires equipment Mobility Management: Has  [employer] Dressing/Bathing Difficulty: no Doing Errands Independently Difficulty (such as shopping) (CP): no Change in Functional Status Since Onset of Current Illness/Injury: no   Caregiver Assessment  Primary Source of Support/Comfort: significant other; extended family Name of Support/Comfort Primary Source: husband Firefighter People in Home: grandchild(ren); spouse Name(s) of People in Home: husband Elyse Jarvis 2 granddaughters 47 and 71 Family Caregiver if Needed: none Family Caregiver Names: husband Elyse Jarvis Primary Roles/Responsibilities: caregiver for other(s)   Planned Interventions  HTN Reviewed current treatment plan related to Hypertension, self-management, and adherence to plan as established by provider.  Reviewed medications and indications for use. Reviewed instructions and BP ranges for administering hydralazine. Reports taking all medications as prescribed. Provided information regarding established blood pressure parameters along with indications for notifying a provider. Reports not monitoring consistently d/t needing a new BP monitor. Advised to monitor daily once new cuff is obtained. Advised to record readings. Reviewed symptoms. Reports history of angina and palpitations. Denies headaches, dizziness, or visual changes. Thorough discussion regarding worsening symptoms that require immediate medical attention. Discussed compliance with  recommended cardiac prudent diet. Encouraged to read nutrition labels, monitor, sodium intake, and avoid highly processed foods when possible.  Reviewed s/sx of heart attack, stroke and worsening symptoms that require immediate medical attention.    DM Discussed plan for Diabetes management. Reviewed medications and discussed importance of medication adherence. Reports taking all medications as prescribed. Denies concerns related to medication management. Discussed importance of consistent blood glucose monitoring. Reports monitoring twice a day. Reports fasting readings have been within range. Reports evening readings have ranged in the 120s to 150s. Fasting reading today was 87 mg/dl.  Discussed information regarding s/sx of hypoglycemia and hyperglycemia along with recommended interventions. Denies recent hypoglycemic or hyperglycemic episodes.  Discussed nutritional intake and importance of complying with a diabetic diet. Discussed importance of consuming vegetables, fruits, and lean proteins. Advised to monitor intake of carbohydrates and avoid foods and beverages with added sugar. Declines current need for additional nutritional resources. Discussed importance of completing recommended DM preventive care. Reports completing daily foot care. Completing foot exams with Podiatry/Dr. Adah Perl every three months. Reports completing eye exams annually. Reports eye exam is up to date. Discussed importance of completing ordered labs as prescribed.  Assessed social determinant of health barriers.     Interaction and coordination with outside resources, practitioners, and providers See CCM Referral  Care Plan: Available in MyChart

## 2022-12-24 ENCOUNTER — Inpatient Hospital Stay: Payer: Medicare Other | Admitting: Hematology and Oncology

## 2022-12-24 NOTE — Assessment & Plan Note (Deleted)
09/07/2019:Routine screening mammogram detected a 2.1cm mass in the left breast and no left axillary adenopathy. 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   Recommendations: 1.  Neoadjuvant chemotherapy with Taxotere/Cytoxan x 2 starting 09/24/2019 (discontinued because of respiratory failure after each chemotherapy) 2. Breast conserving surgery 12/09/2019: Microscopic foci of residual IDC grade 3, largest measured 2.5 mm foci of DCIS high-grade, resection margins negative, ER 0%, PR 0%, HER-2 negative, Ki-67 20% on the final path, 0/1 lymph node negative 3. Adjuvant radiation therapy 01/13/2020-02/08/2020 --------------------------------------------------------------------------------------------------------------------------------------------- 1.  Hospitalization [id number]: Diastolic CHF and asthma exacerbation with hypoxia (prednisone taper and Lasix) 2.  Hospitalization 10/30/2019-11/03/2019: Respiratory failure   Breast cancer surveillance: 1.  12/24/2022 breast exam: Benign 2. 01/22/2022 benign breast density category C   Return to clinic in 1 year for follow-up

## 2022-12-24 NOTE — Progress Notes (Incomplete)
Patient Care Team: Farrel Conners, MD as PCP - General (Family Medicine) Rigoberto Noel, MD as Consulting Physician (Pulmonary Disease) Adrian Prows, MD as Consulting Physician (Cardiology) Lenon Oms, MD as Referring Physician (Obstetrics and Gynecology) Veneda Melter, MD as Referring Physician (Cardiology) [name], MD as Consulting Physician (Anesthesiology) Rolm Bookbinder, MD as Consulting Physician (General Surgery) Nicholas Lose, MD as Consulting Physician (Hematology and Oncology) Kyung Rudd, MD as Consulting Physician (Radiation Oncology) Alda Berthold, DO as Consulting Physician (Neurology) Jarome Matin, MD as Consulting Physician (Dermatology) Viona Gilmore, [employer] (Inactive) as Pharmacist (Pharmacist) Neldon Labella, RN as Case Manager Irene Shipper, MD as Consulting Physician (Gastroenterology)  DIAGNOSIS:  Encounter Diagnosis  Name Primary?   Malignant neoplasm of upper-outer quadrant of left breast in female, estrogen receptor negative ([name]) Yes    SUMMARY OF ONCOLOGIC HISTORY: Oncology History  Malignant neoplasm of upper-outer quadrant of left breast in female, estrogen receptor negative ([name])  09/07/2019 Initial Diagnosis   Routine screening mammogram detected a 2.1cm mass in the left breast and no left axillary adenopathy. 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   09/15/2019 Genetic Testing   Genetic testing reported out on September 15, 2019 through the Multi-cancer panel found no pathogenic mutations. The Multi-Gene Panel offered by [facility] includes sequencing and/or deletion duplication testing of the following 85 genes: AIP, ALK, APC, ATM, AXIN2,BAP1,  BARD1, BLM, BMPR1A, BRCA1, BRCA2, BRIP1, CASR, CDC73, CDH1, CDK4, CDKN1B, CDKN1C, CDKN2A (p14ARF), CDKN2A (p16INK4a), CEBPA, CHEK2, CTNNA1, DICER1, DIS3L2, EGFR (c.2369C>T, p.Thr790Met variant only), EPCAM (Deletion/duplication testing only), FH, FLCN,  GATA2, GPC3, GREM1 (Promoter region deletion/duplication testing only), HOXB13 (c.251G>A, p.Gly84Glu), HRAS, KIT, MAX, MEN1, MET, MITF (c.952G>A, p.Glu318Lys variant only), MLH1, MSH2, MSH3, MSH6, MUTYH, NBN, NF1, NF2, NTHL1, PALB2, PDGFRA, PHOX2B, PMS2, POLD1, POLE, POT1, PRKAR1A, PTCH1, PTEN, RAD50, RAD51C, RAD51D, RB1, RECQL4, RET, RNF43, RUNX1, SDHAF2, SDHA (sequence changes only), SDHB, SDHC, SDHD, SMAD4, SMARCA4, SMARCB1, SMARCE1, STK11, SUFU, TERC, TERT, TMEM127, TP53, TSC1, TSC2, VHL, WRN and WT1.  The test report has been scanned into EPIC and is located under the Molecular Pathology section of the Results Review tab.  A portion of the result report is included below for reference.    09/24/2019 - 10/15/2019 Chemotherapy   palonosetron (ALOXI) injection 0.25 mg, 0.25 mg, Intravenous,  Once, 2 of 6 cycles. Administration: 0.25 mg (09/24/2019), 0.25 mg (10/15/2019)  pegfilgrastim (NEULASTA ONPRO KIT) injection 6 mg, 6 mg, Subcutaneous, Once, 2 of 6 cycles. Administration: 6 mg (09/24/2019), 6 mg (10/15/2019)  cyclophosphamide (CYTOXAN) 1,100 mg in sodium chloride 0.9 % 250 mL chemo infusion, 500 mg/m2 = 1,100 mg (83.3 % of original dose 600 mg/m2), Intravenous,  Once, 2 of 6 cycles. Dose modification: 500 mg/m2 (original dose 600 mg/m2, Cycle 1, Reason: Provider Judgment). Administration: 1,100 mg (09/24/2019), 1,100 mg (10/15/2019)  DOCEtaxel (TAXOTERE) 140 mg in sodium chloride 0.9 % 250 mL chemo infusion, 65 mg/m2 = 140 mg (86.7 % of original dose 75 mg/m2), Intravenous,  Once, 2 of 6 cycles. Dose modification: 65 mg/m2 (original dose 75 mg/m2, Cycle 1, Reason: Provider Judgment). Administration: 140 mg (09/24/2019), 140 mg (10/15/2019)  fosaprepitant (EMEND) 150 mg in sodium chloride 0.9 % 145 mL IVPB, Intravenous,  Once, 2 of 6 cycles. Administration:  (09/24/2019),  (10/15/2019)   12/09/2019 Surgery   Left lumpectomy Shelby Anderson) [phone]): microscopic focus of residual IDC, grade 3,  with high grade DCIS, clear margins. No regional lymph nodes were examined.  12/10/2019 Cancer Staging   Staging form: Breast, AJCC 8th Edition - Pathologic stage from 12/10/2019: No Stage Recommended (ypT1a, pN0, cM0) - Signed by Gardenia Phlegm, NP on 12/23/2019   01/13/2020 - 02/08/2020 Radiation Therapy   The patient initially received a dose of 42.56 Gy in 16 fractions to the breast using whole-breast tangent fields. This was delivered using a 3-D conformal technique. The pt received a boost delivering an additional 8 Gy in 4 fractions using a electron boost with 96mV electrons. The total dose was 50.56 Gy.     CHIEF COMPLIANT:   INTERVAL HISTORY: Shelby Anderson a   ALLERGIES:  is allergic to lodine [etodolac], oxycodone, oxycontin [oxycodone hcl], penicillins, aspirin, darvocet [propoxyphene n-acetaminophen], nitroglycerin, propoxyphene, tramadol, ultram [tramadol hcl], and valium.  MEDICATIONS:  Current Outpatient Medications  Medication Sig Dispense Refill   albuterol (PROVENTIL) (2.5 MG/3ML) 0.083% nebulizer solution Take 3 mLs (2.5 mg total) by nebulization every 6 (six) hours as needed for wheezing or shortness of breath (J45.40). 75 mL 3   albuterol (VENTOLIN HFA) 108 (90 Base) MCG/ACT inhaler TAKE 2 PUFFS BY MOUTH EVERY 6 HOURS AS NEEDED FOR WHEEZE OR SHORTNESS OF BREATH 20.1 each 3   aspirin EC 81 MG tablet Take 81 mg at bedtime by mouth.      atorvastatin (LIPITOR) 80 MG tablet TAKE ONE TABLET BY MOUTH EVERYDAY AT BEDTIME Last refill, patient needs to make appt with PCP for further refills 90 tablet 0   Blood Glucose Monitoring Suppl (ONE TOUCH ULTRA 2) w/Device KIT Use as directed 1 kit 1   budesonide-formoterol (SYMBICORT) 80-4.5 MCG/ACT inhaler INHALE 2 PUFFS INTO THE LUNGS EVERY MORNING AND ANOTHER 2 PUFFS 12 HOURS LATER 10.2 g 3   budesonide-formoterol (SYMBICORT) 80-4.5 MCG/ACT inhaler Inhale 2 puffs into the lungs every morning and another 2 puffs 12 hours  later. 3 each 3   buPROPion (WELLBUTRIN XL) 150 MG 24 hr tablet Take 1 tablet by mouth daily.     Calcium Carbonate-Vitamin D 600-200 MG-UNIT TABS Take 1 tablet by mouth daily.     CINNAMON PO Take 1,000 mg 2 (two) times daily by mouth.     dapagliflozin propanediol (FARXIGA) 10 MG TABS tablet Take 1 tablet (10 mg total) by mouth daily before breakfast. 90 tablet 3   doxepin (SINEQUAN) 10 MG capsule Take by mouth.     esomeprazole (NEXIUM) 40 MG capsule Take 1 capsule (40 mg total) by mouth 2 (two) times daily. Office visit for further refills 180 capsule 0   famotidine (PEPCID) 20 MG tablet TAKE ONE TABLET BY MOUTH TWICE DAILY FOR INDIGESTION / heartburn 180 tablet 1   fluticasone (FLONASE) 50 MCG/ACT nasal spray Place into the nose.     furosemide (LASIX) 20 MG tablet TAKE 1 OR 2 TABLETS BY MOUTH EVERY DAY AS DIRECTED 180 tablet 1   glipiZIDE (GLUCOTROL XL) 5 MG 24 hr tablet TAKE ONE TABLET BY MOUTH EVERY MORNING Last refill, patient needs to make appt with PCP for further refills 90 tablet 0   glucose blood test strip Use as instructed 100 each 3   hydrALAZINE (APRESOLINE) 25 MG tablet Take 1 tablet (25 mg total) by mouth daily as needed (for BP >140/90 mmHg, up to three times daily). 30 tablet 3   hydrocortisone 2.5 % cream Apply topically as needed.     isosorbide mononitrate (IMDUR) 60 MG 24 hr tablet TAKE ONE TABLET BY MOUTH EVERY MORNING Last refill, patient needs to make appt  with PCP for further refills 90 tablet 0   L-Methylfolate (DEPLIN) 7.5 MG TABS Take 7.5 mg by mouth daily with breakfast.     Lancets (ONETOUCH DELICA PLUS [id number]) MISC USE TO CHECK BLOOD SUGAR 2 TIMES A DAY 100 each 1   Lancets (ONETOUCH DELICA PLUS [id number]) MISC USE AS DIRECTED 100 each 1   levothyroxine (SYNTHROID) 75 MCG tablet Take 1 tablet (75 mcg total) by mouth daily before breakfast. 90 tablet 3   lisinopril (ZESTRIL) 5 MG tablet TAKE ONE TABLET BY MOUTH EVERY MORNING 90 tablet 1   LYRICA 150 MG  capsule Take 150 mg by mouth 3 (three) times daily.   2   metFORMIN (GLUCOPHAGE) 500 MG tablet TAKE ONE TABLET BY MOUTH TWICE DAILY WITH A MEAL Last refill, patient needs to make appt with PCP for refills 180 tablet 0   metoprolol succinate (TOPROL-XL) 25 MG 24 hr tablet Take 1 tablet (25 mg total) by mouth daily. TAKE WITH OR IMMEDIATELY FOLLOWING A MEAL. 90 tablet 3   Misc Natural Products (GLUCOS-CHONDROIT-MSM COMPLEX PO) Take 2 tablets by mouth daily.     mometasone (ELOCON) 0.1 % cream Apply topically.     montelukast (SINGULAIR) 10 MG tablet Take 1 tablet (10 mg total) by mouth at bedtime. 90 tablet 3   Multiple Vitamin (MULITIVITAMIN WITH MINERALS) TABS Take 1 tablet by mouth daily.     mupirocin cream (BACTROBAN) 2 % Apply once daily to lesions on feet as needed. 30 g 1   nitroGLYCERIN (NITROSTAT) 0.4 MG SL tablet Place 1 tablet (0.4 mg total) under the tongue every 5 (five) minutes as needed for chest pain. 10 tablet 2   NURTEC 75 MG TBDP Take 1 each by mouth daily as needed. 2 tablet 0   ondansetron (ZOFRAN) 4 MG tablet Take 1 tablet (4 mg total) by mouth every 8 (eight) hours as needed for nausea or vomiting. 80 tablet 2   ONETOUCH ULTRA test strip USE TO CHECK BLOOD SUGAR 2 TIMES A DAY 100 strip 3   oxyCODONE (ROXICODONE) 15 MG immediate release tablet Take 15 mg by mouth 5 (five) times daily.     oxyCODONE (ROXICODONE) 15 MG immediate release tablet Take 1 tablet by mouth four times a day 120 tablet 0   potassium chloride (KLOR-CON) 10 MEQ tablet TAKE TWO TABLETS BY MOUTH EVERY MORNING 180 tablet 1   promethazine (PHENERGAN) 25 MG tablet TAKE ONE TABLET BY MOUTH EVERYDAY AT BEDTIME 90 tablet 3   tiZANidine (ZANAFLEX) 4 MG tablet Take 4 mg by mouth every 8 (eight) hours as needed for muscle spasms.     vitamin B-12 (CYANOCOBALAMIN) 500 MCG tablet Take 500 mcg by mouth daily.      vitamin E 200 UNIT capsule Take 200 Units by mouth daily.     No current facility-administered medications  for this visit.    PHYSICAL EXAMINATION: ECOG PERFORMANCE STATUS: {CHL ONC ECOG PS:[phone]}  There were no vitals filed for this visit. There were no vitals filed for this visit.  BREAST:*** No palpable masses or nodules in either right or left breasts. No palpable axillary supraclavicular or infraclavicular adenopathy no breast tenderness or nipple discharge. (exam performed in the presence of a chaperone)  LABORATORY DATA:  I have reviewed the data as listed    Latest Ref Rng & Units 01/25/2022    1:11 PM 02/27/2021    1:00 PM 09/30/2020    3:33 PM  CMP  Glucose 70 - 99 mg/dL  135  89  112   BUN 6 - 23 mg/dL '16  12  12   '$ Creatinine 0.40 - 1.20 mg/dL 0.79  0.65  0.67   Sodium 135 - 145 mEq/L 140  142  142   Potassium 3.5 - 5.1 mEq/L 4.1  4.1  4.2   Chloride 96 - 112 mEq/L 101  101  101   CO2 19 - 32 mEq/L 31  34  29   Calcium 8.4 - 10.5 mg/dL 9.5  9.0  9.8   Total Protein 6.0 - 8.3 g/dL 7.3  6.5  7.5   Total Bilirubin 0.2 - 1.2 mg/dL 0.5  0.4  0.5   Alkaline Phos 39 - 117 U/L 76  68    AST 0 - 37 U/L '25  18  26   '$ ALT 0 - 35 U/L '30  18  25     '$ Lab Results  Component Value Date   WBC 8.0 01/25/2022   HGB 13.3 01/25/2022   HCT 40.3 01/25/2022   MCV 88.3 01/25/2022   PLT 271.0 01/25/2022   NEUTROABS 6.0 01/25/2022    ASSESSMENT & PLAN:  No problem-specific Assessment & Plan notes found for this encounter.    No orders of the defined types were placed in this encounter.  The patient has a good understanding of the overall plan. she agrees with it. she will call with any problems that may develop before the next visit here. Total time spent: 30 mins including face to face time and time spent for planning, charting and co-ordination of care   Suzzette Righter, [place] 12/24/22    I Gardiner Coins am acting as a Education administrator for [employer]  ***

## 2022-12-26 ENCOUNTER — Telehealth: Payer: Self-pay | Admitting: Hematology and Oncology

## 2022-12-26 NOTE — Telephone Encounter (Signed)
Patient called to r/s missed appointment. R/s patient and notified.

## 2022-12-28 ENCOUNTER — Telehealth: Payer: Self-pay | Admitting: Pharmacist

## 2022-12-28 NOTE — Progress Notes (Unsigned)
Care Management & Coordination Services Pharmacy Team  Reason for Encounter: Medication coordination and delivery  Contacted patient to discuss medications and coordinate delivery from [facility]. {US HC Outreach:28874}  Cycle dispensing form sent to *** for review.   Last adherence delivery date:12/12/2022      Patient is due for next adherence delivery on: 01/10/2023  This delivery to include: Adherence Packaging  30 Days  Doxepine 10 mg - 3 tablets at bedtime Levothyroxine 75 mcg - 1 tablets before breakfast  Tizanidine 4 mg - 2 tablets at breakfast, lunch and dinner  L-methylfolate 7.5 mg - 1 tablet at breakfast Lyrica 150 mg - 1 tablet at breakfast, lunch and dinner Phenergan 25 mg - 1 tablet at bedtime Montelukast 10 mg - 1 tablet at bedtime Potassium 10 meq - 2 tablets at breakfast Metformin 500 mg - 1 tablet at breakfast and dinner Wellbutrin XL 150 mg - 1 tablet at breakfast Isosorbide 60 mg - 1 tablet at breakfast Metoprolol 25 mg  - 1 tablet at breakfast Atorvastatin 80 mg - 1 tablet at bedtime Lisinopril 5 mg - 1 tablet at breakfast Glipizide 5 mg - 1 tablet at breakfast Pepcid 20 mg - 1 tablet at breakfast and dinner  Patient declined the following medications this month: Nexium - she is getting this OTC as needed, it cost less than prescription.   {Delivery LSLH:73428}  Any concerns about your medications? {yes/no:20286}  How often do you forget or accidentally miss a dose? {Missed doses:25554}  Is patient in packaging Yes  If yes  What is the date on your next pill pack?  Any concerns or issues with your packaging?  Recent blood pressure readings are as follows:***  Recent blood glucose readings are as follows:***  Chart review:  Recent office visits:  None  Recent consult visits:  None  Hospital visits:  None  Medications: Outpatient Encounter Medications as of 12/28/2022  Medication Sig Note   albuterol (PROVENTIL) (2.5 MG/3ML)  0.083% nebulizer solution Take 3 mLs (2.5 mg total) by nebulization every 6 (six) hours as needed for wheezing or shortness of breath (J45.40).    albuterol (VENTOLIN HFA) 108 (90 Base) MCG/ACT inhaler TAKE 2 PUFFS BY MOUTH EVERY 6 HOURS AS NEEDED FOR WHEEZE OR SHORTNESS OF BREATH    aspirin EC 81 MG tablet Take 81 mg at bedtime by mouth.     atorvastatin (LIPITOR) 80 MG tablet TAKE ONE TABLET BY MOUTH EVERYDAY AT BEDTIME Last refill, patient needs to make appt with PCP for further refills    Blood Glucose Monitoring Suppl (ONE TOUCH ULTRA 2) w/Device KIT Use as directed    budesonide-formoterol (SYMBICORT) 80-4.5 MCG/ACT inhaler INHALE 2 PUFFS INTO THE LUNGS EVERY MORNING AND ANOTHER 2 PUFFS 12 HOURS LATER    budesonide-formoterol (SYMBICORT) 80-4.5 MCG/ACT inhaler Inhale 2 puffs into the lungs every morning and another 2 puffs 12 hours later.    buPROPion (WELLBUTRIN XL) 150 MG 24 hr tablet Take 1 tablet by mouth daily.    Calcium Carbonate-Vitamin D 600-200 MG-UNIT TABS Take 1 tablet by mouth daily.    CINNAMON PO Take 1,000 mg 2 (two) times daily by mouth.    dapagliflozin propanediol (FARXIGA) 10 MG TABS tablet Take 1 tablet (10 mg total) by mouth daily before breakfast.    doxepin (SINEQUAN) 10 MG capsule Take by mouth.    esomeprazole (NEXIUM) 40 MG capsule Take 1 capsule (40 mg total) by mouth 2 (two) times daily. Office visit for further refills  famotidine (PEPCID) 20 MG tablet TAKE ONE TABLET BY MOUTH TWICE DAILY FOR INDIGESTION / heartburn    fluticasone (FLONASE) 50 MCG/ACT nasal spray Place into the nose.    furosemide (LASIX) 20 MG tablet TAKE 1 OR 2 TABLETS BY MOUTH EVERY DAY AS DIRECTED 12/17/2022: Reports only taking as needed for edema   glipiZIDE (GLUCOTROL XL) 5 MG 24 hr tablet TAKE ONE TABLET BY MOUTH EVERY MORNING Last refill, patient needs to make appt with PCP for further refills    glucose blood test strip Use as instructed    hydrALAZINE (APRESOLINE) 25 MG tablet Take 1  tablet (25 mg total) by mouth daily as needed (for BP >140/90 mmHg, up to three times daily).    hydrocortisone 2.5 % cream Apply topically as needed.    isosorbide mononitrate (IMDUR) 60 MG 24 hr tablet TAKE ONE TABLET BY MOUTH EVERY MORNING Last refill, patient needs to make appt with PCP for further refills    L-Methylfolate (DEPLIN) 7.5 MG TABS Take 7.5 mg by mouth daily with breakfast.    Lancets (ONETOUCH DELICA PLUS [id number]) MISC USE TO CHECK BLOOD SUGAR 2 TIMES A DAY    Lancets (ONETOUCH DELICA PLUS [id number]) MISC USE AS DIRECTED    levothyroxine (SYNTHROID) 75 MCG tablet Take 1 tablet (75 mcg total) by mouth daily before breakfast.    lisinopril (ZESTRIL) 5 MG tablet TAKE ONE TABLET BY MOUTH EVERY MORNING    LYRICA 150 MG capsule Take 150 mg by mouth 3 (three) times daily.     metFORMIN (GLUCOPHAGE) 500 MG tablet TAKE ONE TABLET BY MOUTH TWICE DAILY WITH A MEAL Last refill, patient needs to make appt with PCP for refills    metoprolol succinate (TOPROL-XL) 25 MG 24 hr tablet Take 1 tablet (25 mg total) by mouth daily. TAKE WITH OR IMMEDIATELY FOLLOWING A MEAL.    Misc Natural Products (GLUCOS-CHONDROIT-MSM COMPLEX PO) Take 2 tablets by mouth daily.    mometasone (ELOCON) 0.1 % cream Apply topically.    montelukast (SINGULAIR) 10 MG tablet Take 1 tablet (10 mg total) by mouth at bedtime.    Multiple Vitamin (MULITIVITAMIN WITH MINERALS) TABS Take 1 tablet by mouth daily.    mupirocin cream (BACTROBAN) 2 % Apply once daily to lesions on feet as needed.    nitroGLYCERIN (NITROSTAT) 0.4 MG SL tablet Place 1 tablet (0.4 mg total) under the tongue every 5 (five) minutes as needed for chest pain.    NURTEC 75 MG TBDP Take 1 each by mouth daily as needed.    ondansetron (ZOFRAN) 4 MG tablet Take 1 tablet (4 mg total) by mouth every 8 (eight) hours as needed for nausea or vomiting.    ONETOUCH ULTRA test strip USE TO CHECK BLOOD SUGAR 2 TIMES A DAY    oxyCODONE (ROXICODONE) 15 MG immediate  release tablet Take 15 mg by mouth 5 (five) times daily.    oxyCODONE (ROXICODONE) 15 MG immediate release tablet Take 1 tablet by mouth four times a day    potassium chloride (KLOR-CON) 10 MEQ tablet TAKE TWO TABLETS BY MOUTH EVERY MORNING    promethazine (PHENERGAN) 25 MG tablet TAKE ONE TABLET BY MOUTH EVERYDAY AT BEDTIME    tiZANidine (ZANAFLEX) 4 MG tablet Take 4 mg by mouth every 8 (eight) hours as needed for muscle spasms.    vitamin B-12 (CYANOCOBALAMIN) 500 MCG tablet Take 500 mcg by mouth daily.     vitamin E 200 UNIT capsule Take 200 Units by mouth daily.  No facility-administered encounter medications on file as of 12/28/2022.   BP Readings from Last 3 Encounters:  09/06/22 100/62  08/07/22 100/60  06/19/22 106/63    Pulse Readings from Last 3 Encounters:  09/06/22 66  08/07/22 74  06/19/22 77    Lab Results  Component Value Date/Time   HGBA1C 6.5 (A) 09/06/2022 04:10 PM   HGBA1C 7.5 (H) 01/25/2022 01:11 PM   HGBA1C 7.4 (H) 02/27/2021 01:00 PM   HGBA1C 5.9 01/12/2017 12:00 AM   Lab Results  Component Value Date   CREATININE 0.79 01/25/2022   BUN 16 01/25/2022   GFR 79.36 01/25/2022   GFRNONAA >60 12/08/2019   GFRAA >60 12/08/2019   NA 140 01/25/2022   K 4.1 01/25/2022   CALCIUM 9.5 01/25/2022   CO2 31 01/25/2022   [place] Pharmacist Assistant [phone]

## 2022-12-30 ENCOUNTER — Other Ambulatory Visit: Payer: Self-pay | Admitting: Family Medicine

## 2022-12-30 ENCOUNTER — Other Ambulatory Visit: Payer: Self-pay | Admitting: Family

## 2022-12-30 DIAGNOSIS — F339 Major depressive disorder, recurrent, unspecified: Secondary | ICD-10-CM

## 2023-01-02 DIAGNOSIS — I10 Essential (primary) hypertension: Secondary | ICD-10-CM

## 2023-01-02 DIAGNOSIS — E119 Type 2 diabetes mellitus without complications: Secondary | ICD-10-CM

## 2023-01-02 NOTE — Assessment & Plan Note (Signed)
09/07/2019:Routine screening mammogram detected a 2.1cm mass in the left breast and no left axillary adenopathy. 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   Recommendations: 1.  Neoadjuvant chemotherapy with Taxotere/Cytoxan x 2 starting 09/24/2019 (discontinued because of respiratory failure after each chemotherapy) 2. Breast conserving surgery 12/09/2019: Microscopic foci of residual IDC grade 3, largest measured 2.5 mm foci of DCIS high-grade, resection margins negative, ER 0%, PR 0%, HER-2 negative, Ki-67 20% on the final path, 0/1 lymph node negative 3. Adjuvant radiation therapy 01/13/2020-02/08/2020 --------------------------------------------------------------------------------------------------------------------------------------------- 1.  Hospitalization 28/20/6015-61/04/3793: Diastolic CHF and asthma exacerbation with hypoxia (prednisone taper and Lasix) 2.  Hospitalization 10/30/2019-11/03/2019: Respiratory failure   Breast cancer surveillance: 1.  01/03/2023 breast exam: Benign 2. 01/22/2022: Mammograms: Benign breast density category B   Return to clinic in 1 year for follow-up

## 2023-01-03 ENCOUNTER — Other Ambulatory Visit: Payer: Self-pay

## 2023-01-03 ENCOUNTER — Inpatient Hospital Stay: Payer: Medicare Other | Attending: Hematology and Oncology | Admitting: Hematology and Oncology

## 2023-01-03 VITALS — BP 104/76 | HR 62 | Temp 97.3°F | Resp 19 | Wt 214.6 lb

## 2023-01-03 DIAGNOSIS — Z171 Estrogen receptor negative status [ER-]: Secondary | ICD-10-CM

## 2023-01-03 DIAGNOSIS — Z923 Personal history of irradiation: Secondary | ICD-10-CM | POA: Diagnosis not present

## 2023-01-03 DIAGNOSIS — Z853 Personal history of malignant neoplasm of breast: Secondary | ICD-10-CM | POA: Insufficient documentation

## 2023-01-03 DIAGNOSIS — C50412 Malignant neoplasm of upper-outer quadrant of left female breast: Secondary | ICD-10-CM | POA: Diagnosis not present

## 2023-01-03 DIAGNOSIS — Z9221 Personal history of antineoplastic chemotherapy: Secondary | ICD-10-CM | POA: Diagnosis not present

## 2023-01-03 NOTE — Progress Notes (Signed)
Patient Care Team: Farrel Conners, MD as PCP - General (Family Medicine) Rigoberto Noel, MD as Consulting Physician (Pulmonary Disease) Adrian Prows, MD as Consulting Physician (Cardiology) Lenon Oms, MD as Referring Physician (Obstetrics and Gynecology) Veneda Melter, MD as Referring Physician (Cardiology) [name], MD as Consulting Physician (Anesthesiology) Rolm Bookbinder, MD as Consulting Physician (General Surgery) Nicholas Lose, MD as Consulting Physician (Hematology and Oncology) Kyung Rudd, MD as Consulting Physician (Radiation Oncology) Alda Berthold, DO as Consulting Physician (Neurology) Jarome Matin, MD as Consulting Physician (Dermatology) Viona Gilmore, [facility] (Inactive) as Pharmacist (Pharmacist) Neldon Labella, RN as Case Manager Irene Shipper, MD as Consulting Physician (Gastroenterology)  DIAGNOSIS:  Encounter Diagnosis  Name Primary?   Malignant neoplasm of upper-outer quadrant of left breast in female, estrogen receptor negative ([place]) Yes    SUMMARY OF ONCOLOGIC HISTORY: Oncology History  Malignant neoplasm of upper-outer quadrant of left breast in female, estrogen receptor negative ([place])  09/07/2019 Initial Diagnosis   Routine screening mammogram detected a 2.1cm mass in the left breast and no left axillary adenopathy. 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   09/15/2019 Genetic Testing   Genetic testing reported out on September 15, 2019 through the Multi-cancer panel found no pathogenic mutations. The Multi-Gene Panel offered by [facility] includes sequencing and/or deletion duplication testing of the following 85 genes: AIP, ALK, APC, ATM, AXIN2,BAP1,  BARD1, BLM, BMPR1A, BRCA1, BRCA2, BRIP1, CASR, CDC73, CDH1, CDK4, CDKN1B, CDKN1C, CDKN2A (p14ARF), CDKN2A (p16INK4a), CEBPA, CHEK2, CTNNA1, DICER1, DIS3L2, EGFR (c.2369C>T, p.Thr790Met variant only), EPCAM (Deletion/duplication testing only), FH, FLCN,  GATA2, GPC3, GREM1 (Promoter region deletion/duplication testing only), HOXB13 (c.251G>A, p.Gly84Glu), HRAS, KIT, MAX, MEN1, MET, MITF (c.952G>A, p.Glu318Lys variant only), MLH1, MSH2, MSH3, MSH6, MUTYH, NBN, NF1, NF2, NTHL1, PALB2, PDGFRA, PHOX2B, PMS2, POLD1, POLE, POT1, PRKAR1A, PTCH1, PTEN, RAD50, RAD51C, RAD51D, RB1, RECQL4, RET, RNF43, RUNX1, SDHAF2, SDHA (sequence changes only), SDHB, SDHC, SDHD, SMAD4, SMARCA4, SMARCB1, SMARCE1, STK11, SUFU, TERC, TERT, TMEM127, TP53, TSC1, TSC2, VHL, WRN and WT1.  The test report has been scanned into EPIC and is located under the Molecular Pathology section of the Results Review tab.  A portion of the result report is included below for reference.    09/24/2019 - 10/15/2019 Chemotherapy   palonosetron (ALOXI) injection 0.25 mg, 0.25 mg, Intravenous,  Once, 2 of 6 cycles. Administration: 0.25 mg (09/24/2019), 0.25 mg (10/15/2019)  pegfilgrastim (NEULASTA ONPRO KIT) injection 6 mg, 6 mg, Subcutaneous, Once, 2 of 6 cycles. Administration: 6 mg (09/24/2019), 6 mg (10/15/2019)  cyclophosphamide (CYTOXAN) 1,100 mg in sodium chloride 0.9 % 250 mL chemo infusion, 500 mg/m2 = 1,100 mg (83.3 % of original dose 600 mg/m2), Intravenous,  Once, 2 of 6 cycles. Dose modification: 500 mg/m2 (original dose 600 mg/m2, Cycle 1, Reason: Provider Judgment). Administration: 1,100 mg (09/24/2019), 1,100 mg (10/15/2019)  DOCEtaxel (TAXOTERE) 140 mg in sodium chloride 0.9 % 250 mL chemo infusion, 65 mg/m2 = 140 mg (86.7 % of original dose 75 mg/m2), Intravenous,  Once, 2 of 6 cycles. Dose modification: 65 mg/m2 (original dose 75 mg/m2, Cycle 1, Reason: Provider Judgment). Administration: 140 mg (09/24/2019), 140 mg (10/15/2019)  fosaprepitant (EMEND) 150 mg in sodium chloride 0.9 % 145 mL IVPB, Intravenous,  Once, 2 of 6 cycles. Administration:  (09/24/2019),  (10/15/2019)   12/09/2019 Surgery   Left lumpectomy Donne Hazel) [phone]): microscopic focus of residual IDC, grade 3,  with high grade DCIS, clear margins. No regional lymph nodes were examined.  12/10/2019 Cancer Staging   Staging form: Breast, AJCC 8th Edition - Pathologic stage from 12/10/2019: No Stage Recommended (ypT1a, pN0, cM0) - Signed by Gardenia Phlegm, NP on 12/23/2019   01/13/2020 - 02/08/2020 Radiation Therapy   The patient initially received a dose of 42.56 Gy in 16 fractions to the breast using whole-breast tangent fields. This was delivered using a 3-D conformal technique. The pt received a boost delivering an additional 8 Gy in 4 fractions using a electron boost with 66mV electrons. The total dose was 50.56 Gy.     CHIEF COMPLIANT: Follow-up of triple negative left breast cancer surveillance  INTERVAL HISTORY: Shelby LOOMISis a 67y.o. 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 She presents to the clinic today for follow-up.  She has lost 30 to 35 pounds and is feeling much better.  This she has done by eating better exercising more often.   ALLERGIES:  is allergic to lodine [etodolac], oxycodone, oxycontin [oxycodone hcl], penicillins, aspirin, darvocet [propoxyphene n-acetaminophen], nitroglycerin, propoxyphene, tramadol, ultram [tramadol hcl], and valium.  MEDICATIONS:  Current Outpatient Medications  Medication Sig Dispense Refill   albuterol (PROVENTIL) (2.5 MG/3ML) 0.083% nebulizer solution Take 3 mLs (2.5 mg total) by nebulization every 6 (six) hours as needed for wheezing or shortness of breath (J45.40). 75 mL 3   albuterol (VENTOLIN HFA) 108 (90 Base) MCG/ACT inhaler TAKE 2 PUFFS BY MOUTH EVERY 6 HOURS AS NEEDED FOR WHEEZE OR SHORTNESS OF BREATH 20.1 each 3   aspirin EC 81 MG tablet Take 81 mg at bedtime by mouth.      atorvastatin (LIPITOR) 80 MG tablet TAKE ONE TABLET BY MOUTH EVERYDAY AT BEDTIME Needs appointment for further refills 90 tablet 1   Blood Glucose  Monitoring Suppl (ONE TOUCH ULTRA 2) w/Device KIT Use as directed 1 kit 1   budesonide-formoterol (SYMBICORT) 80-4.5 MCG/ACT inhaler INHALE 2 PUFFS INTO THE LUNGS EVERY MORNING AND ANOTHER 2 PUFFS 12 HOURS LATER 10.2 g 3   budesonide-formoterol (SYMBICORT) 80-4.5 MCG/ACT inhaler Inhale 2 puffs into the lungs every morning and another 2 puffs 12 hours later. 3 each 3   buPROPion (WELLBUTRIN XL) 150 MG 24 hr tablet Take 1 tablet by mouth daily.     Calcium Carbonate-Vitamin D 600-200 MG-UNIT TABS Take 1 tablet by mouth daily.     CINNAMON PO Take 1,000 mg 2 (two) times daily by mouth.     dapagliflozin propanediol (FARXIGA) 10 MG TABS tablet Take 1 tablet (10 mg total) by mouth daily before breakfast. 90 tablet 3   doxepin (SINEQUAN) 10 MG capsule Take by mouth.     esomeprazole (NEXIUM) 40 MG capsule Take 1 capsule (40 mg total) by mouth 2 (two) times daily. Office visit for further refills 180 capsule 0   famotidine (PEPCID) 20 MG tablet TAKE ONE TABLET BY MOUTH TWICE DAILY FOR INDIGESTION / heartburn 180 tablet 1   fluticasone (FLONASE) 50 MCG/ACT nasal spray Place into the nose.     furosemide (LASIX) 20 MG tablet TAKE 1 OR 2 TABLETS BY MOUTH EVERY DAY AS DIRECTED 180 tablet 1   glipiZIDE (GLUCOTROL XL) 5 MG 24 hr tablet TAKE ONE TABLET BY MOUTH EVERY MORNING Needs appointment for further refills 90 tablet 1   glucose blood test strip Use as instructed 100 each 3   hydrALAZINE (APRESOLINE) 25 MG tablet Take 1 tablet (25 mg total) by mouth daily as needed (for  BP >140/90 mmHg, up to three times daily). 30 tablet 3   hydrocortisone 2.5 % cream Apply topically as needed.     isosorbide mononitrate (IMDUR) 60 MG 24 hr tablet TAKE ONE TABLET BY MOUTH EVERY MORNING Needs appointment for further refills 90 tablet 1   L-Methylfolate (DEPLIN) 7.5 MG TABS Take 7.5 mg by mouth daily with breakfast.     Lancets (ONETOUCH DELICA PLUS [id number]) MISC USE TO CHECK BLOOD SUGAR 2 TIMES A DAY 100 each 1   Lancets  (ONETOUCH DELICA PLUS [id number]) MISC USE AS DIRECTED 100 each 1   levothyroxine (SYNTHROID) 75 MCG tablet Take 1 tablet (75 mcg total) by mouth daily before breakfast. 90 tablet 3   lisinopril (ZESTRIL) 5 MG tablet TAKE ONE TABLET BY MOUTH EVERY MORNING 90 tablet 1   LYRICA 150 MG capsule Take 150 mg by mouth 3 (three) times daily.   2   metFORMIN (GLUCOPHAGE) 500 MG tablet TAKE ONE TABLET BY MOUTH TWICE DAILY WITH A MEAL Needs appointment for further refills 180 tablet 1   metoprolol succinate (TOPROL-XL) 25 MG 24 hr tablet Take 1 tablet (25 mg total) by mouth daily. TAKE WITH OR IMMEDIATELY FOLLOWING A MEAL. 90 tablet 3   Misc Natural Products (GLUCOS-CHONDROIT-MSM COMPLEX PO) Take 2 tablets by mouth daily.     mometasone (ELOCON) 0.1 % cream Apply topically.     montelukast (SINGULAIR) 10 MG tablet Take 1 tablet (10 mg total) by mouth at bedtime. 90 tablet 3   Multiple Vitamin (MULITIVITAMIN WITH MINERALS) TABS Take 1 tablet by mouth daily.     mupirocin cream (BACTROBAN) 2 % Apply once daily to lesions on feet as needed. 30 g 1   nitroGLYCERIN (NITROSTAT) 0.4 MG SL tablet Place 1 tablet (0.4 mg total) under the tongue every 5 (five) minutes as needed for chest pain. 10 tablet 2   NURTEC 75 MG TBDP Take 1 each by mouth daily as needed. 2 tablet 0   ondansetron (ZOFRAN) 4 MG tablet Take 1 tablet (4 mg total) by mouth every 8 (eight) hours as needed for nausea or vomiting. 80 tablet 2   ONETOUCH ULTRA test strip USE TO CHECK BLOOD SUGAR 2 TIMES A DAY 100 strip 3   oxyCODONE (ROXICODONE) 15 MG immediate release tablet Take 15 mg by mouth 5 (five) times daily.     oxyCODONE (ROXICODONE) 15 MG immediate release tablet Take 1 tablet by mouth four times a day 120 tablet 0   potassium chloride (KLOR-CON) 10 MEQ tablet TAKE TWO TABLETS BY MOUTH EVERY MORNING 180 tablet 1   promethazine (PHENERGAN) 25 MG tablet TAKE ONE TABLET BY MOUTH EVERYDAY AT BEDTIME 90 tablet 3   tiZANidine (ZANAFLEX) 4 MG tablet  Take 4 mg by mouth every 8 (eight) hours as needed for muscle spasms.     vitamin B-12 (CYANOCOBALAMIN) 500 MCG tablet Take 500 mcg by mouth daily.      vitamin E 200 UNIT capsule Take 200 Units by mouth daily.     No current facility-administered medications for this visit.    PHYSICAL EXAMINATION: ECOG PERFORMANCE STATUS: 1 - Symptomatic but completely ambulatory  Vitals:   01/03/23 1011  BP: 104/76  Pulse: 62  Resp: 19  Temp: (!) 97.3 F (36.3 C)  SpO2: 97%   Filed Weights   01/03/23 1011  Weight: 214 lb 9 oz (97.3 kg)    BREAST: No palpable masses or nodules in either right or left breasts. No palpable axillary supraclavicular  or infraclavicular adenopathy no breast tenderness or nipple discharge. (exam performed in the presence of a chaperone)  LABORATORY DATA:  I have reviewed the data as listed    Latest Ref Rng & Units 01/25/2022    1:11 PM 02/27/2021    1:00 PM 09/30/2020    3:33 PM  CMP  Glucose 70 - 99 mg/dL 135  89  112   BUN 6 - 23 mg/dL '16  12  12   '$ Creatinine 0.40 - 1.20 mg/dL 0.79  0.65  0.67   Sodium 135 - 145 mEq/L 140  142  142   Potassium 3.5 - 5.1 mEq/L 4.1  4.1  4.2   Chloride 96 - 112 mEq/L 101  101  101   CO2 19 - 32 mEq/L 31  34  29   Calcium 8.4 - 10.5 mg/dL 9.5  9.0  9.8   Total Protein 6.0 - 8.3 g/dL 7.3  6.5  7.5   Total Bilirubin 0.2 - 1.2 mg/dL 0.5  0.4  0.5   Alkaline Phos 39 - 117 U/L 76  68    AST 0 - 37 U/L '25  18  26   '$ ALT 0 - 35 U/L '30  18  25     '$ Lab Results  Component Value Date   WBC 8.0 01/25/2022   HGB 13.3 01/25/2022   HCT 40.3 01/25/2022   MCV 88.3 01/25/2022   PLT 271.0 01/25/2022   NEUTROABS 6.0 01/25/2022    ASSESSMENT & PLAN:  Malignant neoplasm of upper-outer quadrant of left breast in female, estrogen receptor negative ([place]) 09/07/2019:Routine screening mammogram detected a 2.1cm mass in the left breast and no left axillary adenopathy. 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    Recommendations: 1.  Neoadjuvant chemotherapy with Taxotere/Cytoxan x 2 starting 09/24/2019 (discontinued because of respiratory failure after each chemotherapy) 2. Breast conserving surgery 12/09/2019: Microscopic foci of residual IDC grade 3, largest measured 2.5 mm foci of DCIS high-grade, resection margins negative, ER 0%, PR 0%, HER-2 negative, Ki-67 20% on the final path, 0/1 lymph node negative 3. Adjuvant radiation therapy 01/13/2020-02/08/2020 --------------------------------------------------------------------------------------------------------------------------------------------- 1.  Hospitalization [id number]/[id number]/08/4853: Diastolic CHF and asthma exacerbation with hypoxia (prednisone taper and Lasix) 2.  Hospitalization 10/30/2019-11/03/2019: Respiratory failure   Breast cancer surveillance: 1.  01/03/2023 breast exam: Benign 2. 01/22/2022: Mammograms: Benign breast density category B   Return to clinic in 1 year for follow-up     No orders of the defined types were placed in this encounter.  The patient has a good understanding of the overall plan. she agrees with it. she will call with any problems that may develop before the next visit here. Total time spent: 30 mins including face to face time and time spent for planning, charting and co-ordination of care   Harriette Ohara, MD 01/03/23    I Gardiner Coins am acting as a Education administrator for [employer]  I have reviewed the above documentation for accuracy and completeness, and I agree with the above.

## 2023-01-08 ENCOUNTER — Encounter: Payer: Self-pay | Admitting: Podiatry

## 2023-01-08 ENCOUNTER — Ambulatory Visit: Payer: Medicare Other | Admitting: Podiatry

## 2023-01-08 DIAGNOSIS — M79675 Pain in left toe(s): Secondary | ICD-10-CM | POA: Diagnosis not present

## 2023-01-08 DIAGNOSIS — M79674 Pain in right toe(s): Secondary | ICD-10-CM

## 2023-01-08 DIAGNOSIS — B351 Tinea unguium: Secondary | ICD-10-CM

## 2023-01-08 DIAGNOSIS — L84 Corns and callosities: Secondary | ICD-10-CM | POA: Diagnosis not present

## 2023-01-08 DIAGNOSIS — E1151 Type 2 diabetes mellitus with diabetic peripheral angiopathy without gangrene: Secondary | ICD-10-CM

## 2023-01-08 NOTE — Progress Notes (Unsigned)
Subjective:  Patient ID: Shelby Anderson, female    DOB: 22-Jan-1956,  MRN: [id number]  Shelby Anderson presents to clinic today for at risk foot care. Pt has h/o NIDDM with PAD and callus(es) b/l lower extremities and painful thick toenails that are difficult to trim. Painful toenails interfere with ambulation. Aggravating factors include wearing enclosed shoe gear. Pain is relieved with periodic professional debridement. Painful calluses are aggravated when weightbearing with and without shoegear. Pain is relieved with periodic professional debridement.  New problem(s): None.   PCP is Shelby Conners, MD.  Allergies  Allergen Reactions   Lodine [Etodolac] Anaphylaxis, Hives and Swelling   Oxycodone Anaphylaxis    Other reaction(s): Other (See Comments) UNKNOWN  hives, trouble breathing, tongue swelling (Only Oxycontin) Tolerates plain oxycodone.   Oxycontin [Oxycodone Hcl] Anaphylaxis    hives, trouble breathing, tongue swelling (Only Oxycontin) Tolerates plain oxycodone.   Penicillins Anaphylaxis    Told by a surgeon never to take it again. Has patient had a PCN reaction causing immediate rash, facial/tongue/throat swelling, SOB or lightheadedness with hypotension: Yes Has patient had a PCN reaction causing severe rash involving mucus membranes or skin necrosis: Unknown Has patient had a PCN reaction that required hospitalization: No Has patient had a PCN reaction occurring within the last 10 years: No If all of the above answers are "NO", then may proceed with Cephalosporin use.   Aspirin Other (See Comments)    High-dose caused GI Bleeds   Darvocet [Propoxyphene N-Acetaminophen] Hives   Nitroglycerin Other (See Comments)    IV-BP drops dramatically Can take po   Propoxyphene Hives   Tramadol Hives and Itching   Ultram [Tramadol Hcl] Hives   Valium Other (See Comments)    Circulation problems. "Legs turned black".    Review of Systems: Negative except as noted  in the HPI.  Objective: No changes noted in today's physical examination. There were no vitals filed for this visit. Shelby Anderson is a pleasant 67 y.o. female obese in NAD. AAO x 3. Vascular Examination: CFT <3 seconds b/l LE. Faintly palpable DP pulses b/l. Nonpalpable PT pulses b/l. Pedal hair absent b/l. Skin temperature gradient WNL b/l. No pain with calf compression b/l. No edema b/l LE. No cyanosis or clubbing noted b/l LE.  Dermatological Examination: Pedal integument with normal turgor, texture and tone b/l LE. No open wounds b/l. No interdigital macerations b/l. Toenails 2-5 bilaterally elongated, thickened, discolored with subungual debris. +Tenderness with dorsal palpation of nailplates. Anonychia noted bilateral great toes. Nailbed(s) epithelialized.    Hyperkeratotic lesion(s) submet head 5 b/l and submet head 1 left foot.  No erythema, no edema, no drainage, no fluctuance.   Musculoskeletal Examination: Normal muscle strength 5/5 to all lower extremity muscle groups bilaterally. No pain, crepitus or joint limitation noted with ROM b/l LE. No gross bony pedal deformities b/l. Patient ambulates independently without assistive aids.  Neurological Examination: Pt has subjective symptoms of neuropathy. Protective sensation intact 5/5 intact bilaterally with 10g monofilament b/l.  Assessment/Plan: 1. Pain due to onychomycosis of toenails of both feet   2. Callus   3. Diabetes mellitus type 2 with peripheral artery disease ([place])     -Patient treated by Clovis Riley, CMA, under my supervision. -Toenails 1-5 b/l were debrided in length and girth with sterile nail nippers and dremel without iatrogenic bleeding.  -Callus(es) submet head 1 left foot and submet head 5 b/l pared utilizing sterile scalpel blade without complication or incident. Total number debrided =.3. -Patient/POA  to call should there be question/concern in the interim.   Return in about 3 months (around  04/08/2023).  Marzetta Board, DPM

## 2023-01-13 DIAGNOSIS — J45909 Unspecified asthma, uncomplicated: Secondary | ICD-10-CM | POA: Diagnosis not present

## 2023-01-13 DIAGNOSIS — G4733 Obstructive sleep apnea (adult) (pediatric): Secondary | ICD-10-CM | POA: Diagnosis not present

## 2023-01-23 DIAGNOSIS — M47816 Spondylosis without myelopathy or radiculopathy, lumbar region: Secondary | ICD-10-CM | POA: Diagnosis not present

## 2023-01-23 DIAGNOSIS — G893 Neoplasm related pain (acute) (chronic): Secondary | ICD-10-CM | POA: Diagnosis not present

## 2023-01-23 DIAGNOSIS — G894 Chronic pain syndrome: Secondary | ICD-10-CM | POA: Diagnosis not present

## 2023-01-23 DIAGNOSIS — Z79891 Long term (current) use of opiate analgesic: Secondary | ICD-10-CM | POA: Diagnosis not present

## 2023-01-28 ENCOUNTER — Ambulatory Visit (INDEPENDENT_AMBULATORY_CARE_PROVIDER_SITE_OTHER)
Admission: RE | Admit: 2023-01-28 | Discharge: 2023-01-28 | Disposition: A | Discharge status: Home / Self Care | Payer: Medicare Other | Source: Ambulatory Visit | Attending: Family Medicine | Admitting: Family Medicine

## 2023-01-28 DIAGNOSIS — Z780 Asymptomatic menopausal state: Secondary | ICD-10-CM | POA: Diagnosis not present

## 2023-01-29 ENCOUNTER — Telehealth: Payer: Self-pay

## 2023-01-29 ENCOUNTER — Other Ambulatory Visit (INDEPENDENT_AMBULATORY_CARE_PROVIDER_SITE_OTHER): Payer: Medicare Other

## 2023-01-29 ENCOUNTER — Other Ambulatory Visit: Payer: Self-pay | Admitting: Family

## 2023-01-29 DIAGNOSIS — F339 Major depressive disorder, recurrent, unspecified: Secondary | ICD-10-CM

## 2023-01-29 DIAGNOSIS — E119 Type 2 diabetes mellitus without complications: Secondary | ICD-10-CM

## 2023-01-29 DIAGNOSIS — E039 Hypothyroidism, unspecified: Secondary | ICD-10-CM

## 2023-01-29 LAB — LIPID PANEL
Cholesterol: 110 mg/dL (ref 0–200)
HDL: 43.7 mg/dL (ref >39.00)
LDL Cholesterol: 52 mg/dL (ref 0–99)
NonHDL: 65.92
Total CHOL/HDL Ratio: 3
Triglycerides: 72 mg/dL (ref 0.0–149.0)
VLDL: 14.4 mg/dL (ref 0.0–40.0)

## 2023-01-29 LAB — COMPREHENSIVE METABOLIC PANEL
ALT: 21 U/L (ref 0–35)
AST: 24 U/L (ref 0–37)
Albumin: 3.8 g/dL (ref 3.5–5.2)
Alkaline Phosphatase: 78 U/L (ref 39–117)
BUN: 21 mg/dL (ref 6–23)
CO2: 32 mEq/L (ref 19–32)
Calcium: 9.6 mg/dL (ref 8.4–10.5)
Chloride: 102 mEq/L (ref 96–112)
Creatinine, Ser: 0.81 mg/dL (ref 0.40–1.20)
GFR: 76.47 mL/min (ref >60.00)
Glucose, Bld: 94 mg/dL (ref 70–99)
Potassium: 3.9 mEq/L (ref 3.5–5.1)
Sodium: 141 mEq/L (ref 135–145)
Total Bilirubin: 0.3 mg/dL (ref 0.2–1.2)
Total Protein: 7.2 g/dL (ref 6.0–8.3)

## 2023-01-29 LAB — HEMOGLOBIN A1C: Hgb A1c MFr Bld: 6.9 % — ABNORMAL HIGH (ref 4.6–6.5)

## 2023-01-29 LAB — TSH: TSH: 3.13 u[IU]/mL (ref 0.35–5.50)

## 2023-01-29 NOTE — Progress Notes (Unsigned)
Care Management & Coordination Services Pharmacy Team  Reason for Encounter: Medication coordination and delivery  Contacted patient to discuss medications and coordinate delivery from [facility]. {US HC Outreach:28874}  Cycle dispensing form sent to *** for review.   Last adherence delivery date:01/10/2023      Patient is due for next adherence delivery on: 02/11/2023  SCHEDULE FOLLOW UP IN MAY  This delivery to include: Adherence Packaging  30 Days  Doxepine 10 mg - 3 tablets at bedtime Levothyroxine 75 mcg - 1 tablets before breakfast  Tizanidine 4 mg - 2 tablets at breakfast, lunch and dinner  L-methylfolate 7.5 mg - 1 tablet at breakfast Lyrica 150 mg - 1 tablet at breakfast, lunch and dinner Phenergan 25 mg - 1 tablet at bedtime Montelukast 10 mg - 1 tablet at bedtime Potassium 10 meq - 2 tablets at breakfast Metformin 500 mg - 1 tablet at breakfast and dinner Wellbutrin XL 150 mg - 1 tablet at breakfast Isosorbide 60 mg - 1 tablet at breakfast Metoprolol 25 mg  - 1 tablet at breakfast Atorvastatin 80 mg - 1 tablet at bedtime Lisinopril 5 mg - 1 tablet at breakfast Glipizide 5 mg - 1 tablet at breakfast Pepcid 20 mg - 1 tablet at breakfast and dinner  Patient declined the following medications this month: Nexium - she is getting this OTC as needed, it cost less than prescription.   {Delivery BK:1911189  Any concerns about your medications? {yes/no:20286}  How often do you forget or accidentally miss a dose? {Missed doses:25554}  Is patient in packaging Yes  If yes  What is the date on your next pill pack?  Any concerns or issues with your packaging?  Recent blood pressure readings are as follows: Patients blood pressure this morning was 118/66   Recent blood glucose readings are as follows:  Patients fasting blood sugar this morning was 108  Chart review: Recent office visits:  None  Recent consult visits:  Acquanetta Sit DPM - Patient was seen  for Pain due to onychomycosis of toenails of both feet and additional concerns. No medication changes.    01/03/2023 Nicholas Lose MD (hem/onc) - Patient was seen for Malignant neoplasm of upper-outer quadrant of left breast in female, estrogen receptor negative. Decreased oxycodone '15mg'$  to 4 times daily.   Hospital visits:  None  Medications: Outpatient Encounter Medications as of 01/29/2023  Medication Sig Note   albuterol (PROVENTIL) (2.5 MG/3ML) 0.083% nebulizer solution Take 3 mLs (2.5 mg total) by nebulization every 6 (six) hours as needed for wheezing or shortness of breath (J45.40).    albuterol (VENTOLIN HFA) 108 (90 Base) MCG/ACT inhaler TAKE 2 PUFFS BY MOUTH EVERY 6 HOURS AS NEEDED FOR WHEEZE OR SHORTNESS OF BREATH    aspirin EC 81 MG tablet Take 81 mg at bedtime by mouth.     atorvastatin (LIPITOR) 80 MG tablet TAKE ONE TABLET BY MOUTH EVERYDAY AT BEDTIME Needs appointment for further refills    Blood Glucose Monitoring Suppl (ONE TOUCH ULTRA 2) w/Device KIT Use as directed    budesonide-formoterol (SYMBICORT) 80-4.5 MCG/ACT inhaler Inhale 2 puffs into the lungs every morning and another 2 puffs 12 hours later.    buPROPion (WELLBUTRIN XL) 150 MG 24 hr tablet Take 1 tablet by mouth daily.    Calcium Carbonate-Vitamin D 600-200 MG-UNIT TABS Take 1 tablet by mouth daily.    CINNAMON PO Take 1,000 mg 2 (two) times daily by mouth.    dapagliflozin propanediol (FARXIGA) 10 MG TABS  tablet Take 1 tablet (10 mg total) by mouth daily before breakfast.    doxepin (SINEQUAN) 10 MG capsule Take by mouth.    esomeprazole (NEXIUM) 40 MG capsule Take 1 capsule (40 mg total) by mouth 2 (two) times daily. Office visit for further refills    famotidine (PEPCID) 20 MG tablet TAKE ONE TABLET BY MOUTH TWICE DAILY FOR INDIGESTION / heartburn    fluticasone (FLONASE) 50 MCG/ACT nasal spray Place into the nose.    furosemide (LASIX) 20 MG tablet TAKE 1 OR 2 TABLETS BY MOUTH EVERY DAY AS DIRECTED 12/17/2022:  Reports only taking as needed for edema   glipiZIDE (GLUCOTROL XL) 5 MG 24 hr tablet TAKE ONE TABLET BY MOUTH EVERY MORNING Needs appointment for further refills    glucose blood test strip Use as instructed    hydrALAZINE (APRESOLINE) 25 MG tablet Take 1 tablet (25 mg total) by mouth daily as needed (for BP >140/90 mmHg, up to three times daily).    hydrocortisone 2.5 % cream Apply topically as needed.    isosorbide mononitrate (IMDUR) 60 MG 24 hr tablet TAKE ONE TABLET BY MOUTH EVERY MORNING Needs appointment for further refills    L-Methylfolate (DEPLIN) 7.5 MG TABS Take 7.5 mg by mouth daily with breakfast.    Lancets (ONETOUCH DELICA PLUS Q000111Q) MISC USE TO CHECK BLOOD SUGAR 2 TIMES A DAY    levothyroxine (SYNTHROID) 75 MCG tablet Take 1 tablet (75 mcg total) by mouth daily before breakfast.    lisinopril (ZESTRIL) 5 MG tablet TAKE ONE TABLET BY MOUTH EVERY MORNING    LYRICA 150 MG capsule Take 150 mg by mouth 3 (three) times daily.     metFORMIN (GLUCOPHAGE) 500 MG tablet TAKE ONE TABLET BY MOUTH TWICE DAILY WITH A MEAL Needs appointment for further refills    metoprolol succinate (TOPROL-XL) 25 MG 24 hr tablet Take 1 tablet (25 mg total) by mouth daily. TAKE WITH OR IMMEDIATELY FOLLOWING A MEAL.    Misc Natural Products (GLUCOS-CHONDROIT-MSM COMPLEX PO) Take 2 tablets by mouth daily.    mometasone (ELOCON) 0.1 % cream Apply topically.    montelukast (SINGULAIR) 10 MG tablet Take 1 tablet (10 mg total) by mouth at bedtime.    Multiple Vitamin (MULITIVITAMIN WITH MINERALS) TABS Take 1 tablet by mouth daily.    mupirocin cream (BACTROBAN) 2 % Apply once daily to lesions on feet as needed.    nitroGLYCERIN (NITROSTAT) 0.4 MG SL tablet Place 1 tablet (0.4 mg total) under the tongue every 5 (five) minutes as needed for chest pain.    NURTEC 75 MG TBDP Take 1 each by mouth daily as needed.    ondansetron (ZOFRAN) 4 MG tablet Take 1 tablet (4 mg total) by mouth every 8 (eight) hours as needed  for nausea or vomiting.    ONETOUCH ULTRA test strip USE TO CHECK BLOOD SUGAR 2 TIMES A DAY    oxyCODONE (ROXICODONE) 15 MG immediate release tablet Take 1 tablet by mouth four times a day    potassium chloride (KLOR-CON) 10 MEQ tablet TAKE TWO TABLETS BY MOUTH EVERY MORNING    promethazine (PHENERGAN) 25 MG tablet TAKE ONE TABLET BY MOUTH EVERYDAY AT BEDTIME    tiZANidine (ZANAFLEX) 4 MG tablet Take 4 mg by mouth every 8 (eight) hours as needed for muscle spasms.    vitamin B-12 (CYANOCOBALAMIN) 500 MCG tablet Take 500 mcg by mouth daily.     vitamin E 200 UNIT capsule Take 200 Units by mouth daily.  No facility-administered encounter medications on file as of 01/29/2023.   BP Readings from Last 3 Encounters:  01/03/23 104/76  09/06/22 100/62  08/07/22 100/60    Pulse Readings from Last 3 Encounters:  01/03/23 62  09/06/22 66  08/07/22 74    Lab Results  Component Value Date/Time   HGBA1C 6.5 (A) 09/06/2022 04:10 PM   HGBA1C 7.5 (H) 01/25/2022 01:11 PM   HGBA1C 7.4 (H) 02/27/2021 01:00 PM   HGBA1C 5.9 01/12/2017 12:00 AM   Lab Results  Component Value Date   CREATININE 0.79 01/25/2022   BUN 16 01/25/2022   GFR 79.36 01/25/2022   GFRNONAA >60 12/08/2019   GFRAA >60 12/08/2019   NA 140 01/25/2022   K 4.1 01/25/2022   CALCIUM 9.5 01/25/2022   CO2 31 01/25/2022   [place] Pharmacist Assistant [phone]

## 2023-02-05 ENCOUNTER — Encounter: Payer: Self-pay | Admitting: Family Medicine

## 2023-02-05 ENCOUNTER — Ambulatory Visit (INDEPENDENT_AMBULATORY_CARE_PROVIDER_SITE_OTHER): Payer: Medicare Other | Admitting: Family Medicine

## 2023-02-05 ENCOUNTER — Other Ambulatory Visit: Payer: Self-pay | Admitting: Internal Medicine

## 2023-02-05 VITALS — BP 112/80 | HR 73 | Temp 98.0°F | Ht 65.0 in | Wt 215.9 lb

## 2023-02-05 DIAGNOSIS — R609 Edema, unspecified: Secondary | ICD-10-CM

## 2023-02-05 DIAGNOSIS — R600 Localized edema: Secondary | ICD-10-CM

## 2023-02-05 DIAGNOSIS — Z0000 Encounter for general adult medical examination without abnormal findings: Secondary | ICD-10-CM | POA: Diagnosis not present

## 2023-02-05 DIAGNOSIS — H903 Sensorineural hearing loss, bilateral: Secondary | ICD-10-CM | POA: Diagnosis not present

## 2023-02-05 MED ORDER — FUROSEMIDE 20 MG PO TABS
20.0000 mg | ORAL_TABLET | Freq: Every day | ORAL | 1 refills | Status: DC | PRN
  Filled 2023-11-25: qty 30, 30d supply, fill #0
  Filled 2023-12-02 – 2023-12-19 (×2): qty 30, 30d supply, fill #1
  Filled 2024-01-07 – 2024-01-14 (×2): qty 30, 30d supply, fill #2
  Filled 2024-01-28 – 2024-02-11 (×2): qty 30, 30d supply, fill #3
  Filled ????-??-??: fill #1

## 2023-02-05 NOTE — Patient Instructions (Signed)

## 2023-02-05 NOTE — Progress Notes (Unsigned)
Complete physical exam  Patient: Shelby Anderson   DOB: Apr 20, 1956   67 y.o. Female  MRN: [id number]  Subjective:    Chief Complaint  Patient presents with   Annual Exam    Shelby Anderson is a 67 y.o. female who presents today for a complete physical exam. She reports consuming a general and low salt low sugar  diet. Home exercise routine includes walking 45 minutes hrs per day. She generally feels well. She reports sleeping poorly. She does not have additional problems to discuss today.    Most recent fall risk assessment:    02/05/2023    3:52 PM  [place] in the past year? 1  Number falls in past yr: 1  Injury with Fall? 1  Risk for fall due to : No Fall Risks  Follow up Falls evaluation completed     Most recent depression screenings:    12/17/2022    4:33 PM 09/06/2022    4:07 PM  PHQ 2/9 Scores  PHQ - 2 Score 1 2  PHQ- 9 Score  7    Vision:Within last year and Dental: No regular dental care   Patient Active Problem List   Diagnosis Date Noted   Hypothyroidism 09/11/2022   Dental caries 06/12/2022   Spondylosis without myelopathy or radiculopathy, cervical region 04/21/2021   Neutropenia with fever ([place]) 10/01/2019   Genetic testing 09/17/2019   Family history of melanoma    Family history of pancreatic cancer    Malignant neoplasm of upper-outer quadrant of left breast in female, estrogen receptor negative ([place]) 09/07/2019   Encounter for loop recorder at end of battery life 04/24/2018   Esophageal stricture 07/01/2017   Hyperlipidemia associated with type 2 diabetes mellitus ([place]) 05/20/2017   Allergic rhinitis 03/11/2017   Dilated cardiomyopathy ([place]) 02/07/2017   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 11/13/2016   Morbid obesity due to excess calories ([place]) 09/26/2016   Increased endometrial stripe thickness 06/27/2016   Intramural leiomyoma of uterus 06/27/2016   Type 2 diabetes mellitus without complication, without  long-term current use of insulin ([place]) 12/06/2015   HTN (hypertension), benign 07/19/2015   Arrhythmia 07/19/2015   Orthostatic hypotension 07/19/2015   GERD (gastroesophageal reflux disease) 07/19/2015   Chronic back pain 07/19/2015   Neuropathy 07/19/2015   Symptomatic PVCs 11/02/2014   Syncope 11/02/2014   Chest pain 12/23/2013   Disorder of cervix 03/10/2013   Vaginal atrophy 03/10/2013   OSA (obstructive sleep apnea) 07/31/2012   Asthma 04/29/2012   Coronary artery disease 11/20/2011      Patient Care Team: Farrel Conners, MD as PCP - General (Family Medicine) Rigoberto Noel, MD as Consulting Physician (Pulmonary Disease) Adrian Prows, MD as Consulting Physician (Cardiology) Lenon Oms, MD as Referring Physician (Obstetrics and Gynecology) Veneda Melter, MD as Referring Physician (Cardiology) [name], MD as Consulting Physician (Anesthesiology) Rolm Bookbinder, MD as Consulting Physician (General Surgery) Nicholas Lose, MD as Consulting Physician (Hematology and Oncology) Kyung Rudd, MD as Consulting Physician (Radiation Oncology) Alda Berthold, DO as Consulting Physician (Neurology) Jarome Matin, MD as Consulting Physician (Dermatology) Viona Gilmore, [facility] (Inactive) as Pharmacist (Pharmacist) Neldon Labella, RN as Case Manager Irene Shipper, MD as Consulting Physician (Gastroenterology)   Outpatient Medications Prior to Visit  Medication Sig   albuterol (PROVENTIL) (2.5 MG/3ML) 0.083% nebulizer solution Take 3 mLs (2.5 mg total) by nebulization every 6 (six) hours as needed for wheezing or shortness of breath (J45.40).  albuterol (VENTOLIN HFA) 108 (90 Base) MCG/ACT inhaler TAKE 2 PUFFS BY MOUTH EVERY 6 HOURS AS NEEDED FOR WHEEZE OR SHORTNESS OF BREATH   aspirin EC 81 MG tablet Take 81 mg at bedtime by mouth.    atorvastatin (LIPITOR) 80 MG tablet TAKE ONE TABLET BY MOUTH EVERYDAY AT BEDTIME Needs appointment for further refills   Blood  Glucose Monitoring Suppl (ONE TOUCH ULTRA 2) w/Device KIT Use as directed   budesonide-formoterol (SYMBICORT) 80-4.5 MCG/ACT inhaler Inhale 2 puffs into the lungs every morning and another 2 puffs 12 hours later.   buPROPion (WELLBUTRIN XL) 150 MG 24 hr tablet Take 1 tablet by mouth daily.   Calcium Carbonate-Vitamin D 600-200 MG-UNIT TABS Take 1 tablet by mouth daily.   CINNAMON PO Take 1,000 mg 2 (two) times daily by mouth.   dapagliflozin propanediol (FARXIGA) 10 MG TABS tablet Take 1 tablet (10 mg total) by mouth daily before breakfast.   doxepin (SINEQUAN) 10 MG capsule Take by mouth.   famotidine (PEPCID) 20 MG tablet TAKE ONE TABLET BY MOUTH TWICE DAILY FOR INDIGESTION / heartburn   fluticasone (FLONASE) 50 MCG/ACT nasal spray Place into the nose.   glipiZIDE (GLUCOTROL XL) 5 MG 24 hr tablet TAKE ONE TABLET BY MOUTH EVERY MORNING Needs appointment for further refills   glucose blood test strip Use as instructed   hydrocortisone 2.5 % cream Apply topically as needed.   isosorbide mononitrate (IMDUR) 60 MG 24 hr tablet TAKE ONE TABLET BY MOUTH EVERY MORNING Needs appointment for further refills   L-Methylfolate (DEPLIN) 7.5 MG TABS Take 7.5 mg by mouth daily with breakfast.   Lancets (ONETOUCH DELICA PLUS Q000111Q) MISC USE TO CHECK BLOOD SUGAR 2 TIMES A DAY   levothyroxine (SYNTHROID) 75 MCG tablet Take 1 tablet (75 mcg total) by mouth daily before breakfast.   lisinopril (ZESTRIL) 5 MG tablet TAKE ONE TABLET BY MOUTH EVERY MORNING   LYRICA 150 MG capsule Take 150 mg by mouth 3 (three) times daily.    metFORMIN (GLUCOPHAGE) 500 MG tablet TAKE ONE TABLET BY MOUTH TWICE DAILY WITH A MEAL Needs appointment for further refills   metoprolol succinate (TOPROL-XL) 25 MG 24 hr tablet Take 1 tablet (25 mg total) by mouth daily. TAKE WITH OR IMMEDIATELY FOLLOWING A MEAL.   Misc Natural Products (GLUCOS-CHONDROIT-MSM COMPLEX PO) Take 2 tablets by mouth daily.   mometasone (ELOCON) 0.1 % cream Apply  topically.   montelukast (SINGULAIR) 10 MG tablet Take 1 tablet (10 mg total) by mouth at bedtime.   Multiple Vitamin (MULITIVITAMIN WITH MINERALS) TABS Take 1 tablet by mouth daily.   mupirocin cream (BACTROBAN) 2 % Apply once daily to lesions on feet as needed.   NEXIUM 40 MG capsule TAKE 1 CAPSULE BY MOUTH TWICE A DAY   nitroGLYCERIN (NITROSTAT) 0.4 MG SL tablet Place 1 tablet (0.4 mg total) under the tongue every 5 (five) minutes as needed for chest pain.   NURTEC 75 MG TBDP Take 1 each by mouth daily as needed.   ondansetron (ZOFRAN) 4 MG tablet Take 1 tablet (4 mg total) by mouth every 8 (eight) hours as needed for nausea or vomiting.   ONETOUCH ULTRA test strip USE TO CHECK BLOOD SUGAR 2 TIMES A DAY   oxyCODONE (ROXICODONE) 15 MG immediate release tablet Take 1 tablet by mouth four times a day   potassium chloride (KLOR-CON) 10 MEQ tablet TAKE TWO TABLETS BY MOUTH EVERY MORNING   promethazine (PHENERGAN) 25 MG tablet TAKE ONE TABLET BY  MOUTH EVERYDAY AT BEDTIME   tiZANidine (ZANAFLEX) 4 MG tablet Take 4 mg by mouth every 8 (eight) hours as needed for muscle spasms.   vitamin B-12 (CYANOCOBALAMIN) 500 MCG tablet Take 500 mcg by mouth daily.    vitamin E 200 UNIT capsule Take 200 Units by mouth daily.   [DISCONTINUED] furosemide (LASIX) 20 MG tablet TAKE 1 OR 2 TABLETS BY MOUTH EVERY DAY AS DIRECTED   hydrALAZINE (APRESOLINE) 25 MG tablet Take 1 tablet (25 mg total) by mouth daily as needed (for BP >140/90 mmHg, up to three times daily).   No facility-administered medications prior to visit.    Review of Systems  HENT:  Negative for hearing loss.   Eyes:  Negative for blurred vision.  Respiratory:  Negative for shortness of breath.   Cardiovascular:  Negative for chest pain.  Gastrointestinal: Negative.   Genitourinary: Negative.   Musculoskeletal:  Negative for back pain.  Neurological:  Negative for headaches.  Psychiatric/Behavioral:  Negative for depression.   All other  systems reviewed and are negative.         Objective:     BP 112/80 (BP Location: Right Arm, Patient Position: Sitting, Cuff Size: Large)   Pulse 73   Temp 98 F (36.7 C) (Oral)   Ht '5\' 5"'$  (1.651 m)   Wt 215 lb 14.4 oz (97.9 kg)   SpO2 98%   BMI 35.93 kg/m  {Vitals History (Optional):23777}  Physical Exam Vitals reviewed.  Constitutional:      Appearance: Normal appearance. She is well-groomed and normal weight.  Eyes:     Conjunctiva/sclera: Conjunctivae normal.  Neck:     Thyroid: No thyromegaly.  Cardiovascular:     Rate and Rhythm: Normal rate and regular rhythm.     Pulses: Normal pulses.     Heart sounds: S1 normal and S2 normal.  Pulmonary:     Effort: Pulmonary effort is normal.     Breath sounds: Normal breath sounds and air entry.  Abdominal:     General: Bowel sounds are normal.  Musculoskeletal:     Right lower leg: No edema.     Left lower leg: No edema.  Neurological:     Mental Status: She is alert and oriented to person, place, and time. Mental status is at baseline.     Gait: Gait is intact.  Psychiatric:        Mood and Affect: Mood and affect normal.        Speech: Speech normal.        Behavior: Behavior normal.        Judgment: Judgment normal.      No results found for any visits on 02/05/23. {Show previous labs (optional):23779}    Assessment & Plan:    Routine Health Maintenance and Physical Exam  Immunization History  Administered Date(s) Administered   Influenza Split 08/19/2011   Influenza Whole 01/03/2013   Influenza,inj,Quad PF,6+ Mos 08/20/2013, 09/15/2014, 08/26/2015, 08/01/2016, 08/22/2017, 10/15/2018, 09/24/2019, 12/07/2020, 02/05/2022, 09/06/2022   Influenza-Unspecified 08/03/2016   Moderna Sars-Covid-2 Vaccination 03/11/2020, 04/08/2020, 08/03/2020   PNEUMOCOCCAL CONJUGATE-20 02/05/2022   Pneumococcal Conjugate-13 02/24/2021   Pneumococcal Polysaccharide-23 08/19/2011, 12/12/2017   Tdap 06/28/2014   Zoster  Recombinat (Shingrix) 09/03/2022, 11/04/2022    Health Maintenance  Topic Date Due   Diabetic kidney evaluation - Urine ACR  01/25/2023   COVID-19 Vaccine (4 - 2023-24 season) 02/21/2023 (Originally 08/03/2022)   FOOT EXAM  04/16/2023 (Originally 12/06/2022)   PAP SMEAR-Modifier  08/05/2023 (Originally 08/21/2021)  OPHTHALMOLOGY EXAM  04/18/2023   HEMOGLOBIN A1C  07/30/2023   Medicare Annual Wellness (AWV)  08/08/2023   MAMMOGRAM  01/23/2024   Diabetic kidney evaluation - eGFR measurement  01/30/2024   DTaP/Tdap/Td (2 - Td or Tdap) 06/28/2024   COLONOSCOPY (Pts 45-61yr Insurance coverage will need to be confirmed)  07/11/2031   INFLUENZA VACCINE  Completed   Hepatitis C Screening  Completed   HIV Screening  Completed   Zoster Vaccines- Shingrix  Completed   HPV VACCINES  Aged Out    Discussed health benefits of physical activity, and encouraged her to engage in regular exercise appropriate for her age and condition.  Problem List Items Addressed This Visit   None Visit Diagnoses     Routine physical examination    -  Primary   Sensorineural hearing loss (SNHL) of both ears       Relevant Orders   Ambulatory referral to Audiology   Peripheral edema       Relevant Medications   furosemide (LASIX) 20 MG tablet      Return in 6 months (on 08/08/2023).     BFarrel Conners MD

## 2023-02-08 ENCOUNTER — Telehealth: Payer: Medicare Other

## 2023-02-08 ENCOUNTER — Telehealth: Payer: Self-pay

## 2023-02-08 NOTE — Telephone Encounter (Signed)
   CCM RN Visit Note   02/08/23 Name: [name] MRN: [id number]      DOB: 1956/01/10  Subjective: Shelby Anderson is a 67 y.o. year old female who is a primary care patient of [name], MD. The patient was referred to the Chronic Care Management team for assistance with care management needs subsequent to provider initiation of CCM services and plan of care.      An unsuccessful outreach attempt was made today for a scheduled CCM visit.    PLAN: A HIPAA compliant phone message was left for the patient providing contact information and requesting a return call.   Horris Latino RN Care Manager/Chronic Care Management [phone]

## 2023-02-15 ENCOUNTER — Ambulatory Visit (HOSPITAL_BASED_OUTPATIENT_CLINIC_OR_DEPARTMENT_OTHER): Payer: Medicare Other | Admitting: Pulmonary Disease

## 2023-02-26 ENCOUNTER — Ambulatory Visit: Payer: Medicare Other | Attending: Audiologist | Admitting: Audiologist

## 2023-02-26 DIAGNOSIS — H903 Sensorineural hearing loss, bilateral: Secondary | ICD-10-CM | POA: Insufficient documentation

## 2023-02-26 DIAGNOSIS — G893 Neoplasm related pain (acute) (chronic): Secondary | ICD-10-CM | POA: Diagnosis not present

## 2023-02-26 DIAGNOSIS — M47816 Spondylosis without myelopathy or radiculopathy, lumbar region: Secondary | ICD-10-CM | POA: Diagnosis not present

## 2023-02-26 DIAGNOSIS — Z79891 Long term (current) use of opiate analgesic: Secondary | ICD-10-CM | POA: Diagnosis not present

## 2023-02-26 DIAGNOSIS — G894 Chronic pain syndrome: Secondary | ICD-10-CM | POA: Diagnosis not present

## 2023-02-27 ENCOUNTER — Telehealth: Payer: Self-pay

## 2023-02-27 NOTE — Procedures (Signed)
  Outpatient Audiology and [place], [place]  [address] [phone]  AUDIOLOGICAL  EVALUATION  NAME: Shelby Anderson     DOB:   Nov 12, 1956      MRN: [id number]                                                                                     DATE: 02/27/2023     REFERENT: Farrel Conners, MD STATUS: Outpatient DIAGNOSIS: Sensorineural Hearing Loss    History: Joshlin was seen for an audiological evaluation. Akeya is receiving a hearing evaluation due to concerns for difficulty hearing people clearly. Aza has difficulty hearing her younger grandchild. She cannot hear her husband if he is in another room. This difficulty began gradually. No pain or pressure reported in either ear.  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 Anjolaoluwa has a history of noise exposure from working in [employer] for many years.  Medical history positive for diabetes which is a risk factor for hearing loss. No other relevant case history reported.   Evaluation:  Otoscopy showed a clear view of the tympanic membranes, bilaterally Tympanometry results were consistent with normal middle ear function, bilaterally   Audiometric testing was completed using conventional audiometry with supraural transducer. Speech Recognition Thresholds were 15 dB in the right ear and 5 dB in the left ear. Word Recognition was  performed 40dB SL, scored 100% in the right ear and 100% in the left ear. Pure tone thresholds show normal hearing sloping after 2kHz to a moderately severe sensorineural hearing loss in both ears.   Results:  The test results were reviewed with Hassan Rowan. She is a borderline hearing aid candidate. She has normal hearing in the low and mid pitches. She is missing the high pitches which will make speech sound unclear. She needs communicate face to face within five feet. Try hearing aids if ready for daily use. Mateja would like to wait a year before trying hearing aids.     Recommendations: 1.   No further audiologic testing is needed at this time. Recommend annual hearing testing due to diabetes and noise exposure history.   35 minutes spent testing and counseling on results.   Alfonse Alpers  Audiologist, Au.D., CCC-A 02/27/2023  9:45 AM  Cc: Farrel Conners, MD

## 2023-02-27 NOTE — Progress Notes (Signed)
Care Management & Coordination Services Pharmacy Team  Reason for Encounter: Medication coordination and delivery  Contacted patient to discuss medications and coordinate delivery from [facility]. Spoke with patient on 02/27/2023   Cycle dispensing form sent to [facility] for review.   Last adherence delivery date: 02/11/2023      Patient is due for next adherence delivery on: 03/12/2023  This delivery to include: Adherence Packaging  30 Days  Doxepine 10 mg - 3 tablets at bedtime Levothyroxine 75 mcg - 1 tablets before breakfast  Tizanidine 4 mg - 2 tablets at breakfast, lunch and dinner  L-methylfolate 7.5 mg - 1 tablet at breakfast Lyrica 150 mg - 1 tablet at breakfast, lunch and dinner Phenergan 25 mg - 1 tablet at bedtime Montelukast 10 mg - 1 tablet at bedtime Potassium 10 meq - 2 tablets at breakfast Metformin 500 mg - 1 tablet at breakfast and dinner Wellbutrin XL 150 mg - 1 tablet at breakfast Isosorbide 60 mg - 1 tablet at breakfast Metoprolol 25 mg  - 1 tablet at breakfast Atorvastatin 80 mg - 1 tablet at bedtime Lisinopril 5 mg - 1 tablet at breakfast Glipizide 5 mg - 1 tablet at breakfast Pepcid 20 mg - 1 tablet at breakfast and dinner  Patient declined the following medications this month: Nexium - she is getting this OTC as needed, it cost less than prescription.   Confirmed delivery date of 03/12/2023, advised patient that pharmacy will contact them the morning of delivery.  Any concerns about your medications? Patient denies  How often do you forget or accidentally miss a dose? Patient denies  Is patient in packaging Yes  If yes  What is the date on your next pill pack? 02/27/2023 PM  Any concerns or issues with your packaging? Patient denies   Recent blood pressure readings are as follows:  Patient states this mornings reading was 111/85  Recent blood glucose readings are as follows: Patient states this mornings fasting reading was  120   Chart review: Recent office visits:  02/05/2023 [name] MD - Patient was seen for Routine physical examination and additional concerns. Decreased Furosemide 20 mg to 1 tablet daily as needed for ankle swelling.  Recent consult visits:  02/26/2023 Alfonse Alpers AUD (audiology) - Patient was seen for Sensorineural hearing loss, bilateral. No medication changes.   Hospital visits:  None  Medications: Outpatient Encounter Medications as of 02/27/2023  Medication Sig   albuterol (PROVENTIL) (2.5 MG/3ML) 0.083% nebulizer solution Take 3 mLs (2.5 mg total) by nebulization every 6 (six) hours as needed for wheezing or shortness of breath (J45.40).   albuterol (VENTOLIN HFA) 108 (90 Base) MCG/ACT inhaler TAKE 2 PUFFS BY MOUTH EVERY 6 HOURS AS NEEDED FOR WHEEZE OR SHORTNESS OF BREATH   aspirin EC 81 MG tablet Take 81 mg at bedtime by mouth.    atorvastatin (LIPITOR) 80 MG tablet TAKE ONE TABLET BY MOUTH EVERYDAY AT BEDTIME Needs appointment for further refills   Blood Glucose Monitoring Suppl (ONE TOUCH ULTRA 2) w/Device KIT Use as directed   budesonide-formoterol (SYMBICORT) 80-4.5 MCG/ACT inhaler Inhale 2 puffs into the lungs every morning and another 2 puffs 12 hours later.   buPROPion (WELLBUTRIN XL) 150 MG 24 hr tablet Take 1 tablet by mouth daily.   Calcium Carbonate-Vitamin D 600-200 MG-UNIT TABS Take 1 tablet by mouth daily.   CINNAMON PO Take 1,000 mg 2 (two) times daily by mouth.   dapagliflozin propanediol (FARXIGA) 10 MG TABS tablet Take 1  tablet (10 mg total) by mouth daily before breakfast.   doxepin (SINEQUAN) 10 MG capsule Take by mouth.   famotidine (PEPCID) 20 MG tablet TAKE ONE TABLET BY MOUTH TWICE DAILY FOR INDIGESTION / heartburn   fluticasone (FLONASE) 50 MCG/ACT nasal spray Place into the nose.   furosemide (LASIX) 20 MG tablet 1 tablet daily as needed for ankle swelling   glipiZIDE (GLUCOTROL XL) 5 MG 24 hr tablet TAKE ONE TABLET BY MOUTH EVERY MORNING Needs  appointment for further refills   glucose blood test strip Use as instructed   hydrALAZINE (APRESOLINE) 25 MG tablet Take 1 tablet (25 mg total) by mouth daily as needed (for BP >140/90 mmHg, up to three times daily).   hydrocortisone 2.5 % cream Apply topically as needed.   isosorbide mononitrate (IMDUR) 60 MG 24 hr tablet TAKE ONE TABLET BY MOUTH EVERY MORNING Needs appointment for further refills   L-Methylfolate (DEPLIN) 7.5 MG TABS Take 7.5 mg by mouth daily with breakfast.   Lancets (ONETOUCH DELICA PLUS Q000111Q) MISC USE TO CHECK BLOOD SUGAR 2 TIMES A DAY   levothyroxine (SYNTHROID) 75 MCG tablet Take 1 tablet (75 mcg total) by mouth daily before breakfast.   lisinopril (ZESTRIL) 5 MG tablet TAKE ONE TABLET BY MOUTH EVERY MORNING   LYRICA 150 MG capsule Take 150 mg by mouth 3 (three) times daily.    metFORMIN (GLUCOPHAGE) 500 MG tablet TAKE ONE TABLET BY MOUTH TWICE DAILY WITH A MEAL Needs appointment for further refills   metoprolol succinate (TOPROL-XL) 25 MG 24 hr tablet Take 1 tablet (25 mg total) by mouth daily. TAKE WITH OR IMMEDIATELY FOLLOWING A MEAL.   Misc Natural Products (GLUCOS-CHONDROIT-MSM COMPLEX PO) Take 2 tablets by mouth daily.   mometasone (ELOCON) 0.1 % cream Apply topically.   montelukast (SINGULAIR) 10 MG tablet Take 1 tablet (10 mg total) by mouth at bedtime.   Multiple Vitamin (MULITIVITAMIN WITH MINERALS) TABS Take 1 tablet by mouth daily.   mupirocin cream (BACTROBAN) 2 % Apply once daily to lesions on feet as needed.   NEXIUM 40 MG capsule TAKE 1 CAPSULE BY MOUTH TWICE A DAY   nitroGLYCERIN (NITROSTAT) 0.4 MG SL tablet Place 1 tablet (0.4 mg total) under the tongue every 5 (five) minutes as needed for chest pain.   NURTEC 75 MG TBDP Take 1 each by mouth daily as needed.   ondansetron (ZOFRAN) 4 MG tablet Take 1 tablet (4 mg total) by mouth every 8 (eight) hours as needed for nausea or vomiting.   ONETOUCH ULTRA test strip USE TO CHECK BLOOD SUGAR 2 TIMES A  DAY   oxyCODONE (ROXICODONE) 15 MG immediate release tablet Take 1 tablet by mouth four times a day   potassium chloride (KLOR-CON) 10 MEQ tablet TAKE TWO TABLETS BY MOUTH EVERY MORNING   promethazine (PHENERGAN) 25 MG tablet TAKE ONE TABLET BY MOUTH EVERYDAY AT BEDTIME   tiZANidine (ZANAFLEX) 4 MG tablet Take 4 mg by mouth every 8 (eight) hours as needed for muscle spasms.   vitamin B-12 (CYANOCOBALAMIN) 500 MCG tablet Take 500 mcg by mouth daily.    vitamin E 200 UNIT capsule Take 200 Units by mouth daily.   No facility-administered encounter medications on file as of 02/27/2023.   BP Readings from Last 3 Encounters:  02/05/23 112/80  01/03/23 104/76  09/06/22 100/62    Pulse Readings from Last 3 Encounters:  02/05/23 73  01/03/23 62  09/06/22 66    Lab Results  Component Value Date/Time  HGBA1C 6.9 (H) 01/29/2023 10:30 AM   HGBA1C 6.5 (A) 09/06/2022 04:10 PM   HGBA1C 7.5 (H) 01/25/2022 01:11 PM   HGBA1C 5.9 01/12/2017 12:00 AM   Lab Results  Component Value Date   CREATININE 0.81 01/29/2023   BUN 21 01/29/2023   GFR 76.47 01/29/2023   GFRNONAA >60 12/08/2019   GFRAA >60 12/08/2019   NA 141 01/29/2023   K 3.9 01/29/2023   CALCIUM 9.6 01/29/2023   CO2 32 01/29/2023   [facility] Pharmacist Assistant [phone]

## 2023-03-20 ENCOUNTER — Ambulatory Visit: Payer: Medicare Other | Admitting: Student

## 2023-03-20 ENCOUNTER — Ambulatory Visit: Payer: Medicare Other | Admitting: Internal Medicine

## 2023-03-25 ENCOUNTER — Encounter: Payer: Self-pay | Admitting: Internal Medicine

## 2023-03-25 ENCOUNTER — Ambulatory Visit: Payer: Medicare Other | Admitting: Internal Medicine

## 2023-03-25 VITALS — BP 119/72 | HR 72 | Resp 18 | Ht 65.0 in | Wt 217.0 lb

## 2023-03-25 DIAGNOSIS — I10 Essential (primary) hypertension: Secondary | ICD-10-CM

## 2023-03-25 DIAGNOSIS — R42 Dizziness and giddiness: Secondary | ICD-10-CM | POA: Diagnosis not present

## 2023-03-25 DIAGNOSIS — I951 Orthostatic hypotension: Secondary | ICD-10-CM | POA: Diagnosis not present

## 2023-03-25 NOTE — Progress Notes (Unsigned)
Primary Physician/Referring:  Karie Georges, MD  Patient ID: Shelby Anderson, female    DOB: 1956-06-28, 67 y.o.   MRN: [id number]  Chief Complaint  Patient presents with   orthostatic hypotension   Follow-up    1 year   HPI:    Shelby Anderson  is a 67 y.o. obese Caucasian female with history of NSTEMI with normal coronary arteries, diabetes mellitus, orthostatic hypotension, chronic chest pain syndrome, recurrent syncope status post loop recorder implantation by [facility] cardiology.  She also has history of chronic severe back pain, bronchial asthma and severe restrictive lung disease, severe GERD, distal esophageal stricture status post dilation, peptic ulcer disease, depression.  Patient is a retired Engineer, civil (consulting).  Echocardiogram and February 2018 revealed RV dilation.  Patient presents for 6  month follow up. At last office visit ordered echo and stress test given atypical chest pain. Also at last visit added PRN hydralazine. Echo was stable compared to previous with LVEF 60-65% and stress test was overall low risk.  Patient is feeling well without complaints today.  Denies chest pain, dyspnea, dizziness.  She is only taking hydralazine a few times as blood pressure is typically well controlled.  She walks 1-1/2 miles 4-5 times per week without issue.  Past Medical History:  Diagnosis Date   Allergy    Anemia    "chronic"   Angina    Prinzmetal angina   Anxiety and depression    Arthritis    Asthma    Atrial fibrillation    h/o "AF w/frequent PVCs"   Breast cancer    Left   Cancer    hx of skin cancer    CHF (congestive heart failure)    Chronic back pain greater than 3 months duration    on chronic narcotics, treated at pain clinic   Chronic respiratory failure 09/15/2015   ONO 09/04/15 + desats >begin O2 at 2l/ m    Colon polyps    hyperplastic   Coronary artery disease    Arrythmia, orthostatic hypotension, HLD, HTN; sees Dr. Jacinto Halim   Difficult intubation     "TMJ & woke up when they were still cutting on me"   Dysrhythmia    sees Dr. Jacinto Halim and a cardiologist at [facility]   Esophageal stricture    Family history of melanoma ]   Family history of pancreatic cancer    Fatty liver    Fatty liver    Fibroids    Fibromyalgia    "in my legs"   GERD (gastroesophageal reflux disease)    hx hiatal hernia, stricture and gastric ulcer   Headache(784.0)    migraines   Heart murmur    Hiatal hernia    History of loop recorder    History of migraines    "dx'd when I was in my teens"   Hyperlipemia    Hypertension    Mental disorder    Mild episode of recurrent major depressive disorder 12/06/2015   Myocardial infarction 1980's & 1990;   sees Dr. 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 asthma   Stenotic cervical os    Stomach ulcer    "  3 small; found in 05/2011"   TMJ (dislocation of temporomandibular joint)    Tuberculosis    + TB SKIN TEST   Type 2 diabetes mellitus without complication, without long-term current use of insulin 12/06/2015   not on meds    Past Surgical History:  Procedure Laterality Date   ACHILLES TENDON REPAIR  1970's   left ankle   ARTHROSCOPIC REPAIR ACL     left knee cap   BREAST BIOPSY Right 04/08/2013   again in October or November 2020   BREAST LUMPECTOMY Left 12/09/2019   BREAST LUMPECTOMY WITH RADIOACTIVE SEED AND SENTINEL LYMPH NODE BIOPSY Left 12/09/2019   Procedure: LEFT BREAST LUMPECTOMY WITH RADIOACTIVE SEED AND LEFT AXILLARY SENTINEL LYMPH NODE BIOPSY;  Surgeon: Emelia Loron, MD;  Location: [facility] OR;  Service: General;  Laterality: Left;  PEC BLOCK   CARDIAC CATHETERIZATION     loop recorder   CARPAL TUNNEL  RELEASE  unknown   left hand   COLONOSCOPY     ESOPHAGOGASTRODUODENOSCOPY (EGD) WITH PROPOFOL N/A 10/29/2017   Procedure: ESOPHAGOGASTRODUODENOSCOPY (EGD) WITH PROPOFOL;  Surgeon: Hilarie Fredrickson, MD;  Location: [facility] ENDOSCOPY;  Service: Endoscopy;  Laterality: N/A;   LOOP RECORDER IMPLANT     PORT-A-CATH REMOVAL N/A 12/09/2019   Procedure: REMOVAL PORT-A-CATH;  Surgeon: Emelia Loron, MD;  Location: [facility] OR;  Service: General;  Laterality: N/A;   PORTACATH PLACEMENT N/A 09/23/2019   Procedure: INSERTION PORT-A-CATH Right Internal Feliberto Harts;  Surgeon: Emelia Loron, MD;  Location: [facility] OR;  Service: General;  Laterality: N/A;   post ganglionectomy  1970's   "for migraine headaches"   pouch string  91,47,82   "did this 3 times (once w/each pregnancy)"   SAVORY DILATION N/A 10/29/2017   Procedure: SAVORY DILATION;  Surgeon: Hilarie Fredrickson, MD;  Location: [facility] ENDOSCOPY;  Service: Endoscopy;  Laterality: N/A;   TOTAL KNEE ARTHROPLASTY Left 09/25/2016   Procedure: LEFT TOTAL KNEE ARTHROPLASTY;  Surgeon: Durene Romans, MD;  Location: [facility] ORS;  Service: Orthopedics;  Laterality: Left;   TUBAL LIGATION  1980's   Family History  Problem Relation Age of Onset   Malignant hyperthermia Father    Hypertension Father    Heart disease Father    Diabetes Father    Cancer Father        skin   Hypertension Mother    Heart disease Mother    Multiple myeloma Mother    Cancer Sister        CERVICAL   Hypertension Sister    Cancer Brother 2       MELANOMA   Heart disease Maternal Grandmother    Other Maternal Grandmother 32       complications of childbirth   Heart disease Maternal Grandfather    Cancer Paternal Grandmother        ?    Heart disease Paternal Grandmother    Heart disease Paternal Grandfather    Cancer Brother        LUNG   Diabetes Sister    Hypertension Sister    Heart disease Sister    Cancer Sister    Cancer Brother    Pancreatic cancer Niece 49   Cancer  Nephew 40       unknown- currently in the [employer]   Anesthesia problems Neg Hx    Hypotension Neg Hx    Pseudochol deficiency Neg Hx    Colon cancer Neg Hx    Esophageal cancer Neg Hx    Rectal cancer Neg  Hx    Stomach cancer Neg Hx    Breast cancer Neg Hx     Social History   Tobacco Use   Smoking status: Never   Smokeless tobacco: Never  Substance Use Topics   Alcohol use: No    Alcohol/week: 0.0 standard drinks of alcohol   Marital Status: Married   ROS  Review of Systems  Constitutional: Negative for malaise/fatigue and weight gain.  Cardiovascular:  Negative for chest pain (no recurrence), claudication, leg swelling, near-syncope, orthopnea, palpitations, paroxysmal nocturnal dyspnea and syncope.  Respiratory:  Negative for shortness of breath.   Hematologic/Lymphatic: Does not bruise/bleed easily.  Gastrointestinal:  Negative for melena.  Neurological:  Negative for weakness. Dizziness: improved.   Objective  Blood pressure 119/72, pulse 72, resp. rate 18, height  (1.651 m), weight 217 lb (98.4 kg), SpO2 96 %.     03/25/2023    1:28 PM 02/05/2023    3:51 PM 01/03/2023   10:11 AM  Vitals with BMI  Height     Weight 217 lbs 215 lbs 14 oz 214 lbs 9 oz  BMI 36.11 35.93   Systolic 119 112 161  Diastolic 72 80 76  Pulse 72 73 62     Physical Exam Vitals reviewed.  Constitutional:      Appearance: She is obese.  Cardiovascular:     Rate and Rhythm: Normal rate and regular rhythm.     Pulses: Intact distal pulses.          Carotid pulses are 2+ on the right side and 2+ on the left side.      Radial pulses are 2+ on the right side and 2+ on the left side.       Dorsalis pedis pulses are 2+ on the right side and 2+ on the left side.       Posterior tibial pulses are 1+ on the right side and 1+ on the left side.     Heart sounds: S1 normal and S2 normal. No murmur heard.    No gallop.     Comments: Femoral and popliteal pulses difficult to evaluate  due to patient body habitus. Pulmonary:     Effort: Pulmonary effort is normal. No respiratory distress.     Breath sounds: No wheezing, rhonchi or rales.  Musculoskeletal:     Right lower leg: No edema.     Left lower leg: No edema.  Neurological:     Mental Status: She is alert.     Laboratory examination:   Recent Labs    01/29/23 1030  NA 141  K 3.9  CL 102  CO2 32  GLUCOSE 94  BUN 21  CREATININE 0.81  CALCIUM 9.6   CrCl cannot be calculated (Patient's most recent lab result is older than the maximum 21 days allowed.).     Latest Ref Rng & Units 01/29/2023   10:30 AM 01/25/2022    1:11 PM 02/27/2021    1:00 PM  CMP  Glucose 70 - 99 mg/dL 94  096  89   BUN 6 - 23 mg/dL Creatinine 0.40 - 1.20 mg/dL 0.45  4.09  8.11   Sodium 135 - 145 mEq/L 141  140  142   Potassium 3.5 - 5.1 mEq/L 3.9  4.1  4.1   Chloride 96 - 112 mEq/L 102  101  101   CO2 19 - 32 mEq/L 32  31  34   Calcium  8.4 - 10.5 mg/dL 9.6  9.5  9.0   Total Protein 6.0 - 8.3 g/dL 7.2  7.3  6.5   Total Bilirubin 0.2 - 1.2 mg/dL 0.3  0.5  0.4   Alkaline Phos 39 - 117 U/L 78  76  68   AST 0 - 37 U/L 24  25  18    ALT 0 - 35 U/L 21  30  18        Latest Ref Rng & Units 01/25/2022    1:11 PM 02/27/2021    1:00 PM 09/30/2020    3:33 PM  CBC  WBC 4.0 - 10.5 K/uL 8.0  5.5  5.5   Hemoglobin 12.0 - 15.0 g/dL 16.1  09.6  04.5   Hematocrit 36.0 - 46.0 % 40.3  38.1  42.8   Platelets 150.0 - 400.0 K/uL 271.0  222.0  271     Lipid Panel Recent Labs    01/29/23 1030  CHOL 110  TRIG 72.0  LDLCALC 52  VLDL 14.4  HDL 43.70  CHOLHDL 3    HEMOGLOBIN A1C Lab Results  Component Value Date   HGBA1C 6.9 (H) 01/29/2023   MPG 160 09/30/2020   TSH Recent Labs    04/02/22 1108 01/29/23 1030  TSH 1.63 3.13    External labs:   None   Allergies   Allergies  Allergen Reactions   Lodine [Etodolac] Anaphylaxis, Hives and Swelling   Oxycodone Anaphylaxis    Other reaction(s): Other (See  Comments) UNKNOWN  hives, trouble breathing, tongue swelling (Only Oxycontin) Tolerates plain oxycodone.   Oxycontin [Oxycodone Hcl] Anaphylaxis    hives, trouble breathing, tongue swelling (Only Oxycontin) Tolerates plain oxycodone.   Penicillins Anaphylaxis    Told by a surgeon never to take it again. Has patient had a PCN reaction causing immediate rash, facial/tongue/throat swelling, SOB or lightheadedness with hypotension: Yes Has patient had a PCN reaction causing severe rash involving mucus membranes or skin necrosis: Unknown Has patient had a PCN reaction that required hospitalization: No Has patient had a PCN reaction occurring within the last 10 years: No If all of the above answers are "NO", then may proceed with Cephalosporin use.   Aspirin Other (See Comments)    High-dose caused GI Bleeds   Darvocet [Propoxyphene N-Acetaminophen] Hives   Nitroglycerin Other (See Comments)    IV-BP drops dramatically Can take po   Propoxyphene Hives   Tramadol Hives and Itching   Ultram [Tramadol Hcl] Hives   Valium Other (See Comments)    Circulation problems. "Legs turned black".    Medications Prior to Visit:   Outpatient Medications Prior to Visit  Medication Sig Dispense Refill   albuterol (PROVENTIL) (2.5 MG/3ML) 0.083% nebulizer solution Take 3 mLs (2.5 mg total) by nebulization every 6 (six) hours as needed for wheezing or shortness of breath (J45.40). 75 mL 3   albuterol (VENTOLIN HFA) 108 (90 Base) MCG/ACT inhaler TAKE 2 PUFFS BY MOUTH EVERY 6 HOURS AS NEEDED FOR WHEEZE OR SHORTNESS OF BREATH 20.1 each 3   aspirin EC 81 MG tablet Take 81 mg at bedtime by mouth.      atorvastatin (LIPITOR) 80 MG tablet TAKE ONE TABLET BY MOUTH EVERYDAY AT BEDTIME Needs appointment for further refills 90 tablet 1   Blood Glucose Monitoring Suppl (ONE TOUCH ULTRA 2) w/Device KIT Use as directed 1 kit 1   budesonide-formoterol (SYMBICORT) 80-4.5 MCG/ACT inhaler Inhale 2 puffs into the lungs every  morning and another 2 puffs 12 hours later.  3 each 3   buPROPion (WELLBUTRIN XL) 150 MG 24 hr tablet Take 1 tablet by mouth daily.     Calcium Carbonate-Vitamin D 600-200 MG-UNIT TABS Take 1 tablet by mouth daily.     CINNAMON PO Take 1,000 mg 2 (two) times daily by mouth.     dapagliflozin propanediol (FARXIGA) 10 MG TABS tablet Take 1 tablet (10 mg total) by mouth daily before breakfast. 90 tablet 3   doxepin (SINEQUAN) 10 MG capsule Take by mouth.     famotidine (PEPCID) 20 MG tablet TAKE ONE TABLET BY MOUTH TWICE DAILY FOR INDIGESTION / heartburn 180 tablet 1   fluticasone (FLONASE) 50 MCG/ACT nasal spray Place into the nose.     furosemide (LASIX) 20 MG tablet 1 tablet daily as needed for ankle swelling 180 tablet 1   glipiZIDE (GLUCOTROL XL) 5 MG 24 hr tablet TAKE ONE TABLET BY MOUTH EVERY MORNING Needs appointment for further refills 90 tablet 1   Glucosamine 750 MG TABS Take by mouth.     glucose blood test strip Use as instructed 100 each 3   hydrALAZINE (APRESOLINE) 25 MG tablet Take 1 tablet (25 mg total) by mouth daily as needed (for BP >140/90 mmHg, up to three times daily). 30 tablet 3   hydrocortisone 2.5 % cream Apply topically as needed.     isosorbide mononitrate (IMDUR) 60 MG 24 hr tablet TAKE ONE TABLET BY MOUTH EVERY MORNING Needs appointment for further refills 90 tablet 1   L-Methylfolate (DEPLIN) 7.5 MG TABS Take 7.5 mg by mouth daily with breakfast.     Lancets (ONETOUCH DELICA PLUS LANCET30G) MISC USE TO CHECK BLOOD SUGAR 2 TIMES A DAY 100 each 1   levothyroxine (SYNTHROID) 75 MCG tablet Take 1 tablet (75 mcg total) by mouth daily before breakfast. 90 tablet 3   lisinopril (ZESTRIL) 5 MG tablet TAKE ONE TABLET BY MOUTH EVERY MORNING 90 tablet 1   LYRICA 150 MG capsule Take 150 mg by mouth 3 (three) times daily.   2   metFORMIN (GLUCOPHAGE) 500 MG tablet TAKE ONE TABLET BY MOUTH TWICE DAILY WITH A MEAL Needs appointment for further refills 180 tablet 1   metoprolol  succinate (TOPROL-XL) 25 MG 24 hr tablet Take 1 tablet (25 mg total) by mouth daily. TAKE WITH OR IMMEDIATELY FOLLOWING A MEAL. 90 tablet 3   Misc Natural Products (GLUCOS-CHONDROIT-MSM COMPLEX PO) Take 2 tablets by mouth daily.     mometasone (ELOCON) 0.1 % cream Apply topically.     montelukast (SINGULAIR) 10 MG tablet Take 1 tablet (10 mg total) by mouth at bedtime. 90 tablet 3   Multiple Vitamin (MULITIVITAMIN WITH MINERALS) TABS Take 1 tablet by mouth daily.     mupirocin cream (BACTROBAN) 2 % Apply once daily to lesions on feet as needed. 30 g 1   NEXIUM 40 MG capsule TAKE 1 CAPSULE BY MOUTH TWICE A DAY 180 capsule 0   nitroGLYCERIN (NITROSTAT) 0.4 MG SL tablet Place 1 tablet (0.4 mg total) under the tongue every 5 (five) minutes as needed for chest pain. 10 tablet 2   NURTEC 75 MG TBDP Take 1 each by mouth daily as needed. 2 tablet 0   ondansetron (ZOFRAN) 4 MG tablet Take 1 tablet (4 mg total) by mouth every 8 (eight) hours as needed for nausea or vomiting. 80 tablet 2   ONETOUCH ULTRA test strip USE TO CHECK BLOOD SUGAR 2 TIMES A DAY 100 strip 3   oxyCODONE (ROXICODONE) 15 MG  immediate release tablet Take 1 tablet by mouth four times a day 120 tablet 0   potassium chloride (KLOR-CON) 10 MEQ tablet TAKE TWO TABLETS BY MOUTH EVERY MORNING 180 tablet 1   promethazine (PHENERGAN) 25 MG tablet TAKE ONE TABLET BY MOUTH EVERYDAY AT BEDTIME 90 tablet 3   tiZANidine (ZANAFLEX) 4 MG tablet Take 4 mg by mouth every 8 (eight) hours as needed for muscle spasms.     vitamin B-12 (CYANOCOBALAMIN) 500 MCG tablet Take 500 mcg by mouth daily.      vitamin E 200 UNIT capsule Take 200 Units by mouth daily.     No facility-administered medications prior to visit.   Final Medications at End of Visit    Current Meds  Medication Sig   albuterol (PROVENTIL) (2.5 MG/3ML) 0.083% nebulizer solution Take 3 mLs (2.5 mg total) by nebulization every 6 (six) hours as needed for wheezing or shortness of breath  (J45.40).   albuterol (VENTOLIN HFA) 108 (90 Base) MCG/ACT inhaler TAKE 2 PUFFS BY MOUTH EVERY 6 HOURS AS NEEDED FOR WHEEZE OR SHORTNESS OF BREATH   aspirin EC 81 MG tablet Take 81 mg at bedtime by mouth.    atorvastatin (LIPITOR) 80 MG tablet TAKE ONE TABLET BY MOUTH EVERYDAY AT BEDTIME Needs appointment for further refills   Blood Glucose Monitoring Suppl (ONE TOUCH ULTRA 2) w/Device KIT Use as directed   budesonide-formoterol (SYMBICORT) 80-4.5 MCG/ACT inhaler Inhale 2 puffs into the lungs every morning and another 2 puffs 12 hours later.   buPROPion (WELLBUTRIN XL) 150 MG 24 hr tablet Take 1 tablet by mouth daily.   Calcium Carbonate-Vitamin D 600-200 MG-UNIT TABS Take 1 tablet by mouth daily.   CINNAMON PO Take 1,000 mg 2 (two) times daily by mouth.   dapagliflozin propanediol (FARXIGA) 10 MG TABS tablet Take 1 tablet (10 mg total) by mouth daily before breakfast.   doxepin (SINEQUAN) 10 MG capsule Take by mouth.   famotidine (PEPCID) 20 MG tablet TAKE ONE TABLET BY MOUTH TWICE DAILY FOR INDIGESTION / heartburn   fluticasone (FLONASE) 50 MCG/ACT nasal spray Place into the nose.   furosemide (LASIX) 20 MG tablet 1 tablet daily as needed for ankle swelling   glipiZIDE (GLUCOTROL XL) 5 MG 24 hr tablet TAKE ONE TABLET BY MOUTH EVERY MORNING Needs appointment for further refills   Glucosamine 750 MG TABS Take by mouth.   glucose blood test strip Use as instructed   hydrALAZINE (APRESOLINE) 25 MG tablet Take 1 tablet (25 mg total) by mouth daily as needed (for BP >140/90 mmHg, up to three times daily).   hydrocortisone 2.5 % cream Apply topically as needed.   isosorbide mononitrate (IMDUR) 60 MG 24 hr tablet TAKE ONE TABLET BY MOUTH EVERY MORNING Needs appointment for further refills   L-Methylfolate (DEPLIN) 7.5 MG TABS Take 7.5 mg by mouth daily with breakfast.   Lancets (ONETOUCH DELICA PLUS LANCET30G) MISC USE TO CHECK BLOOD SUGAR 2 TIMES A DAY   levothyroxine (SYNTHROID) 75 MCG tablet Take  1 tablet (75 mcg total) by mouth daily before breakfast.   lisinopril (ZESTRIL) 5 MG tablet TAKE ONE TABLET BY MOUTH EVERY MORNING   LYRICA 150 MG capsule Take 150 mg by mouth 3 (three) times daily.    metFORMIN (GLUCOPHAGE) 500 MG tablet TAKE ONE TABLET BY MOUTH TWICE DAILY WITH A MEAL Needs appointment for further refills   metoprolol succinate (TOPROL-XL) 25 MG 24 hr tablet Take 1 tablet (25 mg total) by mouth daily. TAKE WITH  OR IMMEDIATELY FOLLOWING A MEAL.   Misc Natural Products (GLUCOS-CHONDROIT-MSM COMPLEX PO) Take 2 tablets by mouth daily.   mometasone (ELOCON) 0.1 % cream Apply topically.   montelukast (SINGULAIR) 10 MG tablet Take 1 tablet (10 mg total) by mouth at bedtime.   Multiple Vitamin (MULITIVITAMIN WITH MINERALS) TABS Take 1 tablet by mouth daily.   mupirocin cream (BACTROBAN) 2 % Apply once daily to lesions on feet as needed.   NEXIUM 40 MG capsule TAKE 1 CAPSULE BY MOUTH TWICE A DAY   nitroGLYCERIN (NITROSTAT) 0.4 MG SL tablet Place 1 tablet (0.4 mg total) under the tongue every 5 (five) minutes as needed for chest pain.   NURTEC 75 MG TBDP Take 1 each by mouth daily as needed.   ondansetron (ZOFRAN) 4 MG tablet Take 1 tablet (4 mg total) by mouth every 8 (eight) hours as needed for nausea or vomiting.   ONETOUCH ULTRA test strip USE TO CHECK BLOOD SUGAR 2 TIMES A DAY   oxyCODONE (ROXICODONE) 15 MG immediate release tablet Take 1 tablet by mouth four times a day   potassium chloride (KLOR-CON) 10 MEQ tablet TAKE TWO TABLETS BY MOUTH EVERY MORNING   promethazine (PHENERGAN) 25 MG tablet TAKE ONE TABLET BY MOUTH EVERYDAY AT BEDTIME   tiZANidine (ZANAFLEX) 4 MG tablet Take 4 mg by mouth every 8 (eight) hours as needed for muscle spasms.   vitamin B-12 (CYANOCOBALAMIN) 500 MCG tablet Take 500 mcg by mouth daily.    vitamin E 200 UNIT capsule Take 200 Units by mouth daily.   Radiology:   No results found.  Cardiac Studies:  PCV ECHOCARDIOGRAM COMPLETE 09/27/2021 Normal  LV systolic function with visual EF 60-65%. Left ventricle cavity is normal in size. Mild left ventricular hypertrophy. Normal global wall motion. Normal diastolic filling pattern, normal LAP. No significant valvular heart disease. Compared to study 10/01/2019 no significant change.   PCV MYOCARDIAL PERFUSION WO LEXISCAN 09/27/2021 Normal ECG stress. The patient exercised for 7 minutes and 13 seconds of a Bruce protocol, achieving approximately 5.91 METs. Normal BP response. Reduced exercise tolerance. Breast tissue attenuation noted in the inferior wall.  Normal myocardial perfusion without ischemi aor scar. Gated SPECT imaging of the left ventricle was normal. All segments of left ventricle demonstrated normal wall motion and thickening. Normal left ventricle.  . No stress lung uptake. TID is normal. Stress LV EF is normal 58%. No previous exam available for comparison. Low risk study.  Echocardiogram 07/16/2017: Left ventricle cavity is normal in size.  Normal global wall motion.  Calculated EF 62%. Moderate biatrial enlargement Moderate RV enlargement with preserved systolic function No significant change compared to prior study 01/2017.  Assessment of pulmonary systolic pressure is again limited due to inadequate TR jet visualization  Heart catheterization 04/07/2010: Normal coronary arteries, LVEF 65%  EKG:  03/19/2022: Sinus rhythm with borderline first-degree AV block at a rate of 61 bpm.  Left axis.  Poor R wave progression, cannot exclude anteroseptal infarct old.   09/12/2021: Sinus rhythm at a rate of 74 bpm.  Normal axis.  Poor R wave progression, cannot exclude anteroseptal infarct old.  Cannot exclude inferior infarct old.  Nonspecific T wave abnormality.  EKG 01/22/2021: Sinus rhythm at a rate of 86 bpm.  Left axis, left anterior fascicular block.  Poor progression, cannot exclude anteroseptal infarct old.  Nonspecific T wave abnormality.  Low voltage complexes, consider pulmonary  disease pattern.  Compared to EKG 01/30/2018, no significant change.  EKG 01/30/2018: Normal sinus  rhythm at a rate of 65 bpm, left axis deviation, left anterior fascicular block.  Poor progression, cannot exclude anteroseptal infarct old.  Nonspecific T abnormality.  No significant change from 07/31/2017.  Assessment     ICD-10-CM   1. Orthostatic hypotension  I95.1 EKG 12-Lead    PCV CAROTID DUPLEX (BILATERAL)    2. Dizziness  R42 PCV CAROTID DUPLEX (BILATERAL)      There are no discontinued medications.   No orders of the defined types were placed in this encounter.   Recommendations:   Shelby Anderson is a 67 y.o. obese Caucasian female with history of NSTEMI with normal coronary arteries, diabetes mellitus, orthostatic hypotension, chronic chest pain syndrome, recurrent syncope status post loop recorder implantation by [facility] cardiology.  She also has history of chronic severe back pain, bronchial asthma and severe restrictive lung disease, severe GERD, distal esophageal stricture status post dilation, peptic ulcer disease, depression.  Patient is a retired Engineer, civil (consulting).  Patient presents for 6 month follow up. At last office visit ordered echo and stress test given atypical chest pain. Also at last visit added PRN hydralazine. Echo was stable compared to previous with LVEF 60-65% and stress test was overall low risk.  Patient is asymptomatic from a cardiovascular standpoint.  Blood pressure is overall well controlled.  Lipids are well controlled.  Reviewed and discussed results of echocardiogram and stress test, details above.  Advised patient to continue to increase physical activity and focus on diet and lifestyle changes in order to lose weight.  No changes are made at today's visit, will continue present medications.   Carotid duplex, if nothing seen then CTA head/neck, if nothing there then MRI brain  Follow-up in 1 year, sooner if needed.   Clotilde Dieter, DO 03/25/2023, 1:52  PM Office: [phone]

## 2023-03-26 ENCOUNTER — Other Ambulatory Visit: Payer: Self-pay | Admitting: Family Medicine

## 2023-03-26 DIAGNOSIS — Z1231 Encounter for screening mammogram for malignant neoplasm of breast: Secondary | ICD-10-CM

## 2023-03-27 ENCOUNTER — Other Ambulatory Visit: Payer: Self-pay | Admitting: Family Medicine

## 2023-03-27 DIAGNOSIS — M47816 Spondylosis without myelopathy or radiculopathy, lumbar region: Secondary | ICD-10-CM | POA: Diagnosis not present

## 2023-03-27 DIAGNOSIS — G893 Neoplasm related pain (acute) (chronic): Secondary | ICD-10-CM | POA: Diagnosis not present

## 2023-03-27 DIAGNOSIS — Z79891 Long term (current) use of opiate analgesic: Secondary | ICD-10-CM | POA: Diagnosis not present

## 2023-03-27 DIAGNOSIS — G894 Chronic pain syndrome: Secondary | ICD-10-CM | POA: Diagnosis not present

## 2023-03-27 DIAGNOSIS — E119 Type 2 diabetes mellitus without complications: Secondary | ICD-10-CM

## 2023-03-27 DIAGNOSIS — F339 Major depressive disorder, recurrent, unspecified: Secondary | ICD-10-CM

## 2023-03-27 MED ORDER — BUPROPION HCL ER (XL) 150 MG PO TB24: 150.0000 mg | ORAL_TABLET | Freq: Every day | ORAL | 1 refills | Status: DC

## 2023-03-28 ENCOUNTER — Ambulatory Visit: Payer: Medicare Other

## 2023-03-29 ENCOUNTER — Telehealth: Payer: Self-pay

## 2023-03-29 NOTE — Progress Notes (Unsigned)
Care Management & Coordination Services Pharmacy Team  Reason for Encounter: Medication coordination and delivery  Contacted patient to discuss medications and coordinate delivery from [facility]. {US HC Outreach:28874}  Cycle dispensing form sent to *** for review.   Last adherence delivery date:03/12/2023      Patient is due for next adherence delivery on: 04/11/2023 RESCHEDULE APPT IT IS ON MEMORIAL DAY This delivery to include: Adherence Packaging  30 Days  Doxepine 10 mg - 3 tablets at bedtime Levothyroxine 75 mcg - 1 tablets before breakfast  Tizanidine 4 mg - 2 tablets at breakfast, lunch and dinner  L-methylfolate 7.5 mg - 1 tablet at breakfast Lyrica 150 mg - 1 tablet at breakfast, lunch and dinner Phenergan 25 mg - 1 tablet at bedtime Montelukast 10 mg - 1 tablet at bedtime Potassium 10 meq - 2 tablets at breakfast Metformin 500 mg - 1 tablet at breakfast and dinner Wellbutrin XL 150 mg - 1 tablet at breakfast Isosorbide 60 mg - 1 tablet at breakfast Metoprolol 25 mg  - 1 tablet at breakfast Atorvastatin 80 mg - 1 tablet at bedtime Lisinopril 5 mg - 1 tablet at breakfast Glipizide 5 mg - 1 tablet at breakfast Pepcid 20 mg - 1 tablet at breakfast and dinner Furosemide 20 mg - 1 tablet as needed  Patient declined the following medications this month: Nexium - she is getting this OTC as needed, it cost less than prescription.   {Delivery UJWJ:19147}  Any concerns about your medications? {yes/no:20286}  How often do you forget or accidentally miss a dose? {Missed doses:25554}  Is patient in packaging {yes/no:20286}  If yes  What is the date on your next pill pack?  Any concerns or issues with your packaging?  Recent blood pressure readings are as follows:***  Recent blood glucose readings are as follows:***   Chart review: Recent office visits:  None  Recent consult visits:  03/25/2023 Rozell Searing Custovic DO (cardiology) - Patient was seen for  orthostatic hypotension and additonal concerns.   Hospital visits:  None  Medications: Outpatient Encounter Medications as of 03/29/2023  Medication Sig   albuterol (PROVENTIL) (2.5 MG/3ML) 0.083% nebulizer solution Take 3 mLs (2.5 mg total) by nebulization every 6 (six) hours as needed for wheezing or shortness of breath (J45.40).   albuterol (VENTOLIN HFA) 108 (90 Base) MCG/ACT inhaler TAKE 2 PUFFS BY MOUTH EVERY 6 HOURS AS NEEDED FOR WHEEZE OR SHORTNESS OF BREATH   aspirin EC 81 MG tablet Take 81 mg at bedtime by mouth.    atorvastatin (LIPITOR) 80 MG tablet TAKE ONE TABLET BY MOUTH EVERYDAY AT BEDTIME Needs appointment for further refills   Blood Glucose Monitoring Suppl (ONE TOUCH ULTRA 2) w/Device KIT Use as directed   budesonide-formoterol (SYMBICORT) 80-4.5 MCG/ACT inhaler Inhale 2 puffs into the lungs every morning and another 2 puffs 12 hours later.   buPROPion (WELLBUTRIN XL) 150 MG 24 hr tablet Take 1 tablet (150 mg total) by mouth daily.   Calcium Carbonate-Vitamin D 600-200 MG-UNIT TABS Take 1 tablet by mouth daily.   CINNAMON PO Take 1,000 mg 2 (two) times daily by mouth.   dapagliflozin propanediol (FARXIGA) 10 MG TABS tablet Take 1 tablet (10 mg total) by mouth daily before breakfast.   doxepin (SINEQUAN) 10 MG capsule Take by mouth.   famotidine (PEPCID) 20 MG tablet TAKE ONE TABLET BY MOUTH TWICE DAILY FOR INDIGESTION / heartburn   fluticasone (FLONASE) 50 MCG/ACT nasal spray Place into the nose.   furosemide (LASIX)  20 MG tablet 1 tablet daily as needed for ankle swelling   glipiZIDE (GLUCOTROL XL) 5 MG 24 hr tablet TAKE ONE TABLET BY MOUTH EVERY MORNING Needs appointment for further refills   Glucosamine 750 MG TABS Take by mouth.   glucose blood test strip Use as instructed   hydrALAZINE (APRESOLINE) 25 MG tablet Take 1 tablet (25 mg total) by mouth daily as needed (for BP >140/90 mmHg, up to three times daily).   hydrocortisone 2.5 % cream Apply topically as needed.    isosorbide mononitrate (IMDUR) 60 MG 24 hr tablet TAKE ONE TABLET BY MOUTH EVERY MORNING Needs appointment for further refills   L-Methylfolate (DEPLIN) 7.5 MG TABS Take 7.5 mg by mouth daily with breakfast.   Lancets (ONETOUCH DELICA PLUS LANCET30G) MISC USE TO CHECK BLOOD SUGAR 2 TIMES A DAY   levothyroxine (SYNTHROID) 75 MCG tablet Take 1 tablet (75 mcg total) by mouth daily before breakfast.   lisinopril (ZESTRIL) 5 MG tablet TAKE ONE TABLET BY MOUTH EVERY MORNING   LYRICA 150 MG capsule Take 150 mg by mouth 3 (three) times daily.    metFORMIN (GLUCOPHAGE) 500 MG tablet TAKE ONE TABLET BY MOUTH TWICE DAILY WITH A MEAL Needs appointment for further refills   metoprolol succinate (TOPROL-XL) 25 MG 24 hr tablet Take 1 tablet (25 mg total) by mouth daily. TAKE WITH OR IMMEDIATELY FOLLOWING A MEAL.   Misc Natural Products (GLUCOS-CHONDROIT-MSM COMPLEX PO) Take 2 tablets by mouth daily.   mometasone (ELOCON) 0.1 % cream Apply topically.   montelukast (SINGULAIR) 10 MG tablet Take 1 tablet (10 mg total) by mouth at bedtime.   Multiple Vitamin (MULITIVITAMIN WITH MINERALS) TABS Take 1 tablet by mouth daily.   mupirocin cream (BACTROBAN) 2 % Apply once daily to lesions on feet as needed.   NEXIUM 40 MG capsule TAKE 1 CAPSULE BY MOUTH TWICE A DAY   nitroGLYCERIN (NITROSTAT) 0.4 MG SL tablet Place 1 tablet (0.4 mg total) under the tongue every 5 (five) minutes as needed for chest pain.   NURTEC 75 MG TBDP Take 1 each by mouth daily as needed.   ondansetron (ZOFRAN) 4 MG tablet Take 1 tablet (4 mg total) by mouth every 8 (eight) hours as needed for nausea or vomiting.   ONETOUCH ULTRA test strip USE TO CHECK BLOOD SUGAR 2 TIMES A DAY   oxyCODONE (ROXICODONE) 15 MG immediate release tablet Take 1 tablet by mouth four times a day   potassium chloride (KLOR-CON) 10 MEQ tablet TAKE TWO TABLETS BY MOUTH EVERY MORNING   promethazine (PHENERGAN) 25 MG tablet TAKE ONE TABLET BY MOUTH EVERYDAY AT BEDTIME    tiZANidine (ZANAFLEX) 4 MG tablet Take 4 mg by mouth every 8 (eight) hours as needed for muscle spasms.   vitamin B-12 (CYANOCOBALAMIN) 500 MCG tablet Take 500 mcg by mouth daily.    vitamin E 200 UNIT capsule Take 200 Units by mouth daily.   No facility-administered encounter medications on file as of 03/29/2023.   BP Readings from Last 3 Encounters:  03/25/23 119/72  02/05/23 112/80  01/03/23 104/76    Pulse Readings from Last 3 Encounters:  03/25/23 72  02/05/23 73  01/03/23 62    Lab Results  Component Value Date/Time   HGBA1C 6.9 (H) 01/29/2023 10:30 AM   HGBA1C 6.5 (A) 09/06/2022 04:10 PM   HGBA1C 7.5 (H) 01/25/2022 01:11 PM   HGBA1C 5.9 01/12/2017 12:00 AM   Lab Results  Component Value Date   CREATININE 0.81 01/29/2023  BUN 21 01/29/2023   GFR 76.47 01/29/2023   GFRNONAA >60 12/08/2019   GFRAA >60 12/08/2019   NA 141 01/29/2023   K 3.9 01/29/2023   CALCIUM 9.6 01/29/2023   CO2 32 01/29/2023   [name] CMA  Clinical Pharmacist Assistant [phone]

## 2023-04-02 ENCOUNTER — Ambulatory Visit (HOSPITAL_BASED_OUTPATIENT_CLINIC_OR_DEPARTMENT_OTHER): Payer: Medicare Other | Admitting: Pulmonary Disease

## 2023-04-13 DIAGNOSIS — J45909 Unspecified asthma, uncomplicated: Secondary | ICD-10-CM | POA: Diagnosis not present

## 2023-04-13 DIAGNOSIS — G4733 Obstructive sleep apnea (adult) (pediatric): Secondary | ICD-10-CM | POA: Diagnosis not present

## 2023-04-17 ENCOUNTER — Telehealth: Payer: Self-pay | Admitting: Internal Medicine

## 2023-04-17 NOTE — Telephone Encounter (Signed)
Dr Melton Alar Pt prescreened for Lpa trial   Her Lpa was 179.5.    She Met Lp(a) criteria for the trial, but is excluded due to breast cancer with in last 5 years  Wanted to let you know in case you wanted to consider PCSK9   Thanks Hazle Nordmann, PharmD Medication Management [facility]

## 2023-04-23 ENCOUNTER — Ambulatory Visit: Payer: Medicare Other

## 2023-04-23 DIAGNOSIS — R42 Dizziness and giddiness: Secondary | ICD-10-CM

## 2023-04-23 DIAGNOSIS — E119 Type 2 diabetes mellitus without complications: Secondary | ICD-10-CM | POA: Diagnosis not present

## 2023-04-23 DIAGNOSIS — H2513 Age-related nuclear cataract, bilateral: Secondary | ICD-10-CM | POA: Diagnosis not present

## 2023-04-23 DIAGNOSIS — D3132 Benign neoplasm of left choroid: Secondary | ICD-10-CM | POA: Diagnosis not present

## 2023-04-23 DIAGNOSIS — I951 Orthostatic hypotension: Secondary | ICD-10-CM

## 2023-04-23 DIAGNOSIS — H04123 Dry eye syndrome of bilateral lacrimal glands: Secondary | ICD-10-CM | POA: Diagnosis not present

## 2023-04-23 LAB — HM DIABETES EYE EXAM

## 2023-04-24 ENCOUNTER — Encounter: Payer: Self-pay | Admitting: Podiatry

## 2023-04-24 ENCOUNTER — Ambulatory Visit: Payer: Medicare Other | Admitting: Podiatry

## 2023-04-24 DIAGNOSIS — B351 Tinea unguium: Secondary | ICD-10-CM

## 2023-04-24 DIAGNOSIS — M79675 Pain in left toe(s): Secondary | ICD-10-CM | POA: Diagnosis not present

## 2023-04-24 DIAGNOSIS — L84 Corns and callosities: Secondary | ICD-10-CM | POA: Diagnosis not present

## 2023-04-24 DIAGNOSIS — M21621 Bunionette of right foot: Secondary | ICD-10-CM | POA: Diagnosis not present

## 2023-04-24 DIAGNOSIS — E1151 Type 2 diabetes mellitus with diabetic peripheral angiopathy without gangrene: Secondary | ICD-10-CM | POA: Diagnosis not present

## 2023-04-24 DIAGNOSIS — E119 Type 2 diabetes mellitus without complications: Secondary | ICD-10-CM | POA: Diagnosis not present

## 2023-04-24 DIAGNOSIS — M79674 Pain in right toe(s): Secondary | ICD-10-CM | POA: Diagnosis not present

## 2023-04-24 DIAGNOSIS — M2041 Other hammer toe(s) (acquired), right foot: Secondary | ICD-10-CM | POA: Diagnosis not present

## 2023-04-24 DIAGNOSIS — M2042 Other hammer toe(s) (acquired), left foot: Secondary | ICD-10-CM | POA: Diagnosis not present

## 2023-04-24 NOTE — Progress Notes (Signed)
ANNUAL DIABETIC FOOT EXAM  Subjective: Shelby Anderson presents today annual diabetic foot exam. She is concerned about bump on outside of right foot as well as left great toe site of previous nail avulsion. States area has been draining "pus". She has been keeping it clean and applying antibiotic cream.  Chief Complaint  Patient presents with   Nail Problem    DFC/Great toenails has some drainage BS-118 A1C-6.9 PCP-Michael PCP VST- 1 month ago    Patient confirms h/o diabetes.  Patient denies any h/o foot wounds.  Patient has been diagnosed with neuropathy.  Risk factors: diabetes, neuropathy, HTN, CAD, CHF, hyperlipidemia.  Karie Georges, MD is patient's PCP.  Past Medical History:  Diagnosis Date   Allergy    Anemia    "chronic"   Angina    Prinzmetal angina   Anxiety and depression    Arthritis    Asthma    Atrial fibrillation (HCC)    h/o "AF w/frequent PVCs"   Breast cancer (HCC)    Left   Cancer (HCC)    hx of skin cancer    CHF (congestive heart failure) (HCC)    Chronic back pain greater than 3 months duration    on chronic narcotics, treated at pain clinic   Chronic respiratory failure (HCC) 09/15/2015   ONO 09/04/15 + desats >begin O2 at 2l/ m    Colon polyps    hyperplastic   Coronary artery disease    Arrythmia, orthostatic hypotension, HLD, HTN; sees Dr. Jacinto Halim   Difficult intubation    "TMJ & woke up when they were still cutting on me"   Dysrhythmia    sees Dr. 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 asthma   Stenotic cervical os    Stomach ulcer    "3 small; found in 05/2011"   TMJ (dislocation of temporomandibular joint)    Tuberculosis    + TB SKIN TEST   Type 2 diabetes mellitus without complication, without long-term current use of insulin (HCC) 12/06/2015   not on meds    Patient Active Problem List   Diagnosis Date Noted   Hypothyroidism 09/11/2022   Dental caries 06/12/2022   Spondylosis without myelopathy or radiculopathy, cervical region 04/21/2021   Neutropenia with fever (HCC) 10/01/2019   Genetic testing 09/17/2019   Family  history of melanoma    Family history of pancreatic cancer    Malignant neoplasm of upper-outer quadrant of left breast in female, estrogen receptor negative (HCC) 09/07/2019   Encounter for loop recorder at end of battery life 04/24/2018   Esophageal stricture 07/01/2017   Hyperlipidemia associated with type 2 diabetes mellitus (HCC) 05/20/2017   Allergic rhinitis 03/11/2017   Dilated cardiomyopathy (HCC) 02/07/2017   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 11/13/2016   Morbid obesity due to excess calories (HCC) 09/26/2016   Increased endometrial stripe thickness 06/27/2016   Intramural leiomyoma of uterus 06/27/2016   Type 2 diabetes mellitus without complication, without long-term current use of insulin (HCC) 12/06/2015   HTN (hypertension), benign 07/19/2015   Arrhythmia 07/19/2015   Orthostatic hypotension 07/19/2015   GERD (gastroesophageal  reflux disease) 07/19/2015   Chronic back pain 07/19/2015   Neuropathy 07/19/2015   Symptomatic PVCs 11/02/2014   Syncope 11/02/2014   Status post placement of implantable loop recorder 11/02/2014   Stenosis of cervix 01/07/2014   Chest pain 12/23/2013   Disorder of cervix 03/10/2013   Vaginal atrophy 03/10/2013   OSA (obstructive sleep apnea) 07/31/2012   Asthma 04/29/2012   Coronary artery disease 11/20/2011   Past Surgical History:  Procedure Laterality Date   ACHILLES TENDON REPAIR  1970's   left ankle   ARTHROSCOPIC REPAIR ACL     left knee cap   BREAST BIOPSY Right 04/08/2013   again in October or November 2020   BREAST LUMPECTOMY Left 12/09/2019   BREAST LUMPECTOMY WITH RADIOACTIVE SEED AND SENTINEL LYMPH NODE BIOPSY Left 12/09/2019   Procedure: LEFT BREAST LUMPECTOMY WITH RADIOACTIVE SEED AND LEFT AXILLARY SENTINEL LYMPH NODE BIOPSY;  Surgeon: Emelia Loron, MD;  Location: [facility] OR;  Service: General;  Laterality: Left;  PEC BLOCK   CARDIAC CATHETERIZATION     loop recorder   CARPAL TUNNEL RELEASE  unknown   left hand   COLONOSCOPY     ESOPHAGOGASTRODUODENOSCOPY (EGD) WITH PROPOFOL N/A 10/29/2017   Procedure: ESOPHAGOGASTRODUODENOSCOPY (EGD) WITH PROPOFOL;  Surgeon: Hilarie Fredrickson, MD;  Location: [facility];  Service: Endoscopy;  Laterality: N/A;   LOOP RECORDER IMPLANT     PORT-A-CATH REMOVAL N/A 12/09/2019   Procedure: REMOVAL PORT-A-CATH;  Surgeon: Emelia Loron, MD;  Location: [facility] OR;  Service: General;  Laterality: N/A;   PORTACATH PLACEMENT N/A 09/23/2019   Procedure: INSERTION PORT-A-CATH Right Internal Feliberto Harts;  Surgeon: Emelia Loron, MD;  Location: [facility] OR;  Service: General;  Laterality: N/A;   post ganglionectomy  1970's   "for migraine headaches"   pouch string  41,32,44   "did this 3 times (once w/each pregnancy)"   SAVORY DILATION N/A 10/29/2017   Procedure: SAVORY DILATION;  Surgeon: Hilarie Fredrickson, MD;  Location: [facility];   Service: Endoscopy;  Laterality: N/A;   TOTAL KNEE ARTHROPLASTY Left 09/25/2016   Procedure: LEFT TOTAL KNEE ARTHROPLASTY;  Surgeon: Durene Romans, MD;  Location: [facility] ORS;  Service: Orthopedics;  Laterality: Left;   TUBAL LIGATION  1980's   Current Outpatient Medications on File Prior to Visit  Medication Sig Dispense Refill   albuterol (PROVENTIL) (2.5 MG/3ML) 0.083% nebulizer solution Take 3 mLs (2.5 mg total) by nebulization every 6 (six) hours as needed for wheezing or shortness of breath (J45.40). 75 mL 3   albuterol (VENTOLIN HFA) 108 (90 Base) MCG/ACT inhaler TAKE 2 PUFFS BY MOUTH EVERY 6 HOURS AS NEEDED FOR WHEEZE OR SHORTNESS OF BREATH 20.1 each 3   aspirin EC 81  MG tablet Take 81 mg at bedtime by mouth.      atorvastatin (LIPITOR) 80 MG tablet TAKE ONE TABLET BY MOUTH EVERYDAY AT BEDTIME Needs appointment for further refills 90 tablet 1   Blood Glucose Monitoring Suppl (ONE TOUCH ULTRA 2) w/Device KIT Use as directed 1 kit 1   budesonide-formoterol (SYMBICORT) 80-4.5 MCG/ACT inhaler Inhale 2 puffs into the lungs every morning and another 2 puffs 12 hours later. 3 each 3   buPROPion (WELLBUTRIN XL) 150 MG 24 hr tablet Take 1 tablet (150 mg total) by mouth daily. 90 tablet 1   Calcium Carbonate-Vitamin D 600-200 MG-UNIT TABS Take 1 tablet by mouth daily.     CINNAMON PO Take 1,000 mg 2 (two) times daily by mouth.     dapagliflozin propanediol (FARXIGA) 10 MG TABS tablet Take 1 tablet (10 mg total) by mouth daily before breakfast. 90 tablet 3   doxepin (SINEQUAN) 10 MG capsule Take by mouth.     famotidine (PEPCID) 20 MG tablet TAKE ONE TABLET BY MOUTH TWICE DAILY FOR INDIGESTION / heartburn 180 tablet 1   fluticasone (FLONASE) 50 MCG/ACT nasal spray Place into the nose.     furosemide (LASIX) 20 MG tablet 1 tablet daily as needed for ankle swelling 180 tablet 1   glipiZIDE (GLUCOTROL XL) 5 MG 24 hr tablet TAKE ONE TABLET BY MOUTH EVERY MORNING Needs appointment for further refills 90  tablet 1   Glucosamine 750 MG TABS Take by mouth.     glucose blood test strip Use as instructed 100 each 3   hydrALAZINE (APRESOLINE) 25 MG tablet Take 1 tablet (25 mg total) by mouth daily as needed (for BP >140/90 mmHg, up to three times daily). 30 tablet 3   hydrocortisone 2.5 % cream Apply topically as needed.     isosorbide mononitrate (IMDUR) 60 MG 24 hr tablet TAKE ONE TABLET BY MOUTH EVERY MORNING Needs appointment for further refills 90 tablet 1   L-Methylfolate (DEPLIN) 7.5 MG TABS Take 7.5 mg by mouth daily with breakfast.     Lancets (ONETOUCH DELICA PLUS LANCET30G) MISC USE TO CHECK BLOOD SUGAR 2 TIMES A DAY 100 each 1   levothyroxine (SYNTHROID) 75 MCG tablet Take 1 tablet (75 mcg total) by mouth daily before breakfast. 90 tablet 3   lisinopril (ZESTRIL) 5 MG tablet TAKE ONE TABLET BY MOUTH EVERY MORNING 90 tablet 1   LYRICA 150 MG capsule Take 150 mg by mouth 3 (three) times daily.   2   metFORMIN (GLUCOPHAGE) 500 MG tablet TAKE ONE TABLET BY MOUTH TWICE DAILY WITH A MEAL Needs appointment for further refills 180 tablet 1   metoprolol succinate (TOPROL-XL) 25 MG 24 hr tablet Take 1 tablet (25 mg total) by mouth daily. TAKE WITH OR IMMEDIATELY FOLLOWING A MEAL. 90 tablet 3   Misc Natural Products (GLUCOS-CHONDROIT-MSM COMPLEX PO) Take 2 tablets by mouth daily.     mometasone (ELOCON) 0.1 % cream Apply topically.     montelukast (SINGULAIR) 10 MG tablet Take 1 tablet (10 mg total) by mouth at bedtime. 90 tablet 3   Multiple Vitamin (MULITIVITAMIN WITH MINERALS) TABS Take 1 tablet by mouth daily.     mupirocin cream (BACTROBAN) 2 % Apply once daily to lesions on feet as needed. 30 g 1   NEXIUM 40 MG capsule TAKE 1 CAPSULE BY MOUTH TWICE A DAY 180 capsule 0   nitroGLYCERIN (NITROSTAT) 0.4 MG SL tablet Place 1 tablet (0.4 mg total) under the tongue every 5 (  five) minutes as needed for chest pain. 10 tablet 2   NURTEC 75 MG TBDP Take 1 each by mouth daily as needed. 2 tablet 0    ondansetron (ZOFRAN) 4 MG tablet Take 1 tablet (4 mg total) by mouth every 8 (eight) hours as needed for nausea or vomiting. 80 tablet 2   ONETOUCH ULTRA test strip USE TO CHECK BLOOD SUGAR 2 TIMES A DAY 100 strip 3   oxyCODONE (ROXICODONE) 15 MG immediate release tablet Take 1 tablet by mouth four times a day 120 tablet 0   potassium chloride (KLOR-CON) 10 MEQ tablet TAKE TWO TABLETS BY MOUTH EVERY MORNING 180 tablet 1   promethazine (PHENERGAN) 25 MG tablet TAKE ONE TABLET BY MOUTH EVERYDAY AT BEDTIME 90 tablet 3   tiZANidine (ZANAFLEX) 4 MG tablet Take 4 mg by mouth every 8 (eight) hours as needed for muscle spasms.     vitamin B-12 (CYANOCOBALAMIN) 500 MCG tablet Take 500 mcg by mouth daily.      vitamin E 200 UNIT capsule Take 200 Units by mouth daily.     No current facility-administered medications on file prior to visit.    Allergies  Allergen Reactions   Lodine [Etodolac] Anaphylaxis, Hives and Swelling   Oxycodone Anaphylaxis    Other reaction(s): Other (See Comments) UNKNOWN  hives, trouble breathing, tongue swelling (Only Oxycontin) Tolerates plain oxycodone.   Oxycontin [Oxycodone Hcl] Anaphylaxis    hives, trouble breathing, tongue swelling (Only Oxycontin) Tolerates plain oxycodone.   Penicillins Anaphylaxis    Told by a surgeon never to take it again. Has patient had a PCN reaction causing immediate rash, facial/tongue/throat swelling, SOB or lightheadedness with hypotension: Yes Has patient had a PCN reaction causing severe rash involving mucus membranes or skin necrosis: Unknown Has patient had a PCN reaction that required hospitalization: No Has patient had a PCN reaction occurring within the last 10 years: No If all of the above answers are "NO", then may proceed with Cephalosporin use.   Aspirin Other (See Comments)    High-dose caused GI Bleeds   Darvocet [Propoxyphene N-Acetaminophen] Hives   Nitroglycerin Other (See Comments)    IV-BP drops dramatically Can  take po   Propoxyphene Hives   Tramadol Hives and Itching   Ultram [Tramadol Hcl] Hives   Valium Other (See Comments)    Circulation problems. "Legs turned black".   Social History   Occupational History   Occupation: Retired Charity fundraiser  Tobacco Use   Smoking status: Never   Smokeless tobacco: Never  Building services engineer Use: Never used  Substance and Sexual Activity   Alcohol use: No    Alcohol/week: 0.0 standard drinks of alcohol   Drug use: No   Sexual activity: Not Currently    Birth control/protection: Surgical    Comment: 1st intercourse 67 yo-Fewer than 5 partners   Family History  Problem Relation Age of Onset   Malignant hyperthermia Father    Hypertension Father    Heart disease Father    Diabetes Father    Cancer Father        skin   Hypertension Mother    Heart disease Mother    Multiple myeloma Mother    Cancer Sister        CERVICAL   Hypertension Sister    Cancer Brother 22       MELANOMA   Heart disease Maternal Grandmother    Other Maternal Grandmother 32       complications of childbirth  Heart disease Maternal Grandfather    Cancer Paternal Grandmother        ?    Heart disease Paternal Grandmother    Heart disease Paternal Grandfather    Cancer Brother        LUNG   Diabetes Sister    Hypertension Sister    Heart disease Sister    Cancer Sister    Cancer Brother    Pancreatic cancer Niece 74   Cancer Nephew 40       unknown- currently in the military   Anesthesia problems Neg Hx    Hypotension Neg Hx    Pseudochol deficiency Neg Hx    Colon cancer Neg Hx    Esophageal cancer Neg Hx    Rectal cancer Neg Hx    Stomach cancer Neg Hx    Breast cancer Neg Hx    Immunization History  Administered Date(s) Administered   Influenza Split 08/19/2011   Influenza Whole 01/03/2013   Influenza,inj,Quad PF,6+ Mos 08/20/2013, 09/15/2014, 08/26/2015, 08/01/2016, 08/22/2017, 10/15/2018, 09/24/2019, 12/07/2020, 02/05/2022, 09/06/2022    Influenza-Unspecified 08/03/2016   Moderna Sars-Covid-2 Vaccination 03/11/2020, 04/08/2020, 08/03/2020   PNEUMOCOCCAL CONJUGATE-20 02/05/2022   Pneumococcal Conjugate-13 02/24/2021   Pneumococcal Polysaccharide-23 08/19/2011, 12/12/2017   Tdap 06/28/2014   Zoster Recombinat (Shingrix) 09/03/2022, 11/04/2022     Review of Systems: Negative except as noted in the HPI.   Objective: There were no vitals filed for this visit.  CIRE GALAN is a pleasant 67 y.o. female in NAD. AAO X 3.  Vascular Examination: CFT <3 seconds b/l. DP pulses faintly palpable b/l. PT pulses nonpalpable b/l. Digital hair absent. Skin temperature gradient warm to warm b/l. No pain with calf compression. No ischemia or gangrene. No cyanosis or clubbing noted b/l.    Neurological Examination: Sensation grossly intact b/l with 10 gram monofilament. Vibratory sensation intact b/l. Pt has subjective symptoms of neuropathy.  Dermatological Examination: Pedal skin warm and supple b/l. No open wounds b/l. No interdigital macerations. Toenails  2-5 b/l thick, discolored, elongated with subungual debris and pain on dorsal palpation.    Anonychia left great toe with area of dried heme. No erythema, no edema, no drainage expressed, no underlying wound noted. Anonychia right great toe. Nailbed intact.  Incurvated nailplate medial border R 5th toe.  Nail border hypertrophy absent. There is tenderness to palpation. Sign(s) of infection: no clinical signs of infection noted on examination today.   Hyperkeratotic lesion(s) submet head 5 b/l.  No erythema, no edema, no drainage, no fluctuance.  Musculoskeletal Examination: Muscle strength 5/5 to all lower extremity muscle groups bilaterally. Tailor's bunion deformity noted right lower extremity. Hammertoe deformity noted 1-5 b/l. Patient ambulates independent of any assistive aids.  Radiographs: None  Last HgA1c:      Latest Ref Rng & Units 01/29/2023   10:30 AM  09/06/2022    4:10 PM  Hemoglobin A1C  Hemoglobin-A1c 4.6 - 6.5 % 6.9  6.5    ADA Risk Categorization: High Risk  Patient has one or more of the following: Loss of protective sensation Absent pedal pulses Severe Foot deformity History of foot ulcer  Assessment: 1. Pain due to onychomycosis of toenails of both feet   2. Callus   3. Hammertoes of both feet   4. Tailor's bunion of right foot   5. Diabetes mellitus type 2 with peripheral artery disease (HCC)   6. Encounter for diabetic foot exam (HCC)     Plan: Orders Placed This Encounter  Procedures  For Home Use Only DME Diabetic Shoe    Dispense one pair of extra depth shoes and 3 pair total contact insoles. Offload calluses submet head 5 bilaterally.   FOR HOME USE ONLY DME DIABETIC SHOE VAS [place] ABI WITH/WO TBI  -Patient was evaluated and treated. All patient's and/or POA's questions/concerns answered on today's visit. -Due to risk factor(s), ordered noninvasive arterial studies ABIs with and without TBIs for b/l lower extremities. -Diabetic foot examination performed today. -Patient to continue soft, supportive shoe gear daily. -Discussed diabetic shoe benefit available based on patient's diagnoses. Patient/POA would like to proceed. Order entered for one pair extra depth shoes and 3 pair total contact insoles. Patient qualifies based on diagnoses. -Toenails were debrided in length and girth 2-5 bilaterally with sterile nail nippers and dremel without iatrogenic bleeding.  -Callus(es) submet head 5 b/l pared utilizing sterile scalpel blade without complication or incident. Total number debrided =2. -Patient referred to Dr. Nicholes Rough for evaluation of Tailor's bunion and left great toe site of previous nail avulsion. -Patient/POA to call should there be question/concern in the interim. Return in about 3 months (around 07/25/2023).  Freddie Breech, DPM

## 2023-04-30 ENCOUNTER — Telehealth: Payer: Self-pay

## 2023-04-30 NOTE — Progress Notes (Signed)
Care Management & Coordination Services Pharmacy Note  05/01/2023 Name:  Shelby Anderson MRN:  [id number] DOB:  [birth year]  Summary: BP at goal <130/80 A1C at goal <7 Feels like she is having a-fib episodes, has f/u with cardio on 6/3 to discuss  Recommendations/Changes made from today's visit: -Counseled to continue to check BP/HR on daily basis -Counseled to continue twice daily BG checks  Follow up plan: BP call in 1 and 4 months Pharmacist visit in 6 months    Subjective: Shelby Anderson is an 67 y.o. year old female who is a primary patient of Karie Georges, MD.  The care coordination team was consulted for assistance with disease management and care coordination needs.    Engaged with patient by telephone for follow up visit.  Recent office visits: 02/05/2023 [name] MD - Patient was seen for Routine physical examination and additional concerns. Decreased Furosemide 20 mg to 1 tablet daily as needed for ankle swelling.   Recent consult visits: 04/24/23 Geralynn Rile, DPM (Podiatry) - For pain due to onychomycosis of toenails of both feet. No medication changes  03/25/2023 Clotilde Dieter DO (cardiology) - Patient was seen for orthostatic hypotension and additonal concerns.   02/26/2023 Ammie Ferrier AUD (audiology) - Patient was seen for Sensorineural hearing loss, bilateral. No medication changes   Hospital visits: None in previous 6 months   Objective:  Lab Results  Component Value Date   CREATININE 0.81 01/29/2023   BUN 21 01/29/2023   GFR 76.47 01/29/2023   GFRNONAA >60 12/08/2019   GFRAA >60 12/08/2019   NA 141 01/29/2023   K 3.9 01/29/2023   CALCIUM 9.6 01/29/2023   CO2 32 01/29/2023   GLUCOSE 94 01/29/2023    Lab Results  Component Value Date/Time   HGBA1C 6.9 (H) 01/29/2023 10:30 AM   HGBA1C 6.5 (A) 09/06/2022 04:10 PM   HGBA1C 7.5 (H) 01/25/2022 01:11 PM   HGBA1C 5.9 01/12/2017 12:00 AM   GFR 76.47 01/29/2023 10:30 AM    GFR 79.36 01/25/2022 01:11 PM   MICROALBUR 7.7 (H) 01/25/2022 01:11 PM   MICROALBUR 1.1 02/27/2021 01:00 PM    Last diabetic Eye exam:  Lab Results  Component Value Date/Time   HMDIABEYEEXA No Retinopathy 04/23/2023 10:03 AM    Last diabetic Foot exam: No results found for: "HMDIABFOOTEX"   Lab Results  Component Value Date   CHOL 110 01/29/2023   HDL 43.70 01/29/2023   LDLCALC 52 01/29/2023   TRIG 72.0 01/29/2023   CHOLHDL 3 01/29/2023       Latest Ref Rng & Units 01/29/2023   10:30 AM 01/25/2022    1:11 PM 02/27/2021    1:00 PM  Hepatic Function  Total Protein 6.0 - 8.3 g/dL 7.2  7.3  6.5   Albumin 3.5 - 5.2 g/dL 3.8  4.2  3.7   AST 0 - 37 U/L 24  25  18    ALT 0 - 35 U/L 21  30  18    Alk Phosphatase 39 - 117 U/L 78  76  68   Total Bilirubin 0.2 - 1.2 mg/dL 0.3  0.5  0.4     Lab Results  Component Value Date/Time   TSH 3.13 01/29/2023 10:30 AM   TSH 1.63 04/02/2022 11:08 AM   FREET4 1.2 09/30/2020 03:33 PM   FREET4 0.81 04/10/2016 03:11 PM       Latest Ref Rng & Units 01/25/2022    1:11 PM 02/27/2021    1:00 PM 09/30/2020  3:33 PM  CBC  WBC 4.0 - 10.5 K/uL 8.0  5.5  5.5   Hemoglobin 12.0 - 15.0 g/dL 16.1  09.6  04.5   Hematocrit 36.0 - 46.0 % 40.3  38.1  42.8   Platelets 150.0 - 400.0 K/uL 271.0  222.0  271     Lab Results  Component Value Date/Time   VD25OH 57 09/30/2020 03:33 PM   VITAMINB12 898 09/30/2020 03:33 PM   VITAMINB12 1,020 (H) 04/10/2019 09:44 AM    Clinical ASCVD: Yes  The ASCVD Risk score (Arnett DK, et al., 2019) failed to calculate for the following reasons:   The patient has a prior MI or stroke diagnosis       02/05/2023    4:21 PM 12/17/2022    4:33 PM 09/06/2022    4:07 PM  Depression screen PHQ 2/9  Decreased Interest 1 0 1  Down, Depressed, Hopeless 1 1 1   PHQ - 2 Score 2 1 2   Altered sleeping 1  1  Tired, decreased energy 1  2  Change in appetite 2  2  Feeling bad or failure about yourself  0  0  Trouble concentrating  0  0  Moving slowly or fidgety/restless 0  0  Suicidal thoughts 0  0  PHQ-9 Score 6  7  Difficult doing work/chores Somewhat difficult  Somewhat difficult     Social History   Tobacco Use  Smoking Status Never  Smokeless Tobacco Never   BP Readings from Last 3 Encounters:  03/25/23 119/72  02/05/23 112/80  01/03/23 104/76   Pulse Readings from Last 3 Encounters:  03/25/23 72  02/05/23 73  01/03/23 62   Wt Readings from Last 3 Encounters:  03/25/23 217 lb (98.4 kg)  02/05/23 215 lb 14.4 oz (97.9 kg)  01/03/23 214 lb 9 oz (97.3 kg)   BMI Readings from Last 3 Encounters:  03/25/23 36.11 kg/m  02/05/23 35.93 kg/m  01/03/23 35.71 kg/m    Allergies  Allergen Reactions   Lodine [Etodolac] Anaphylaxis, Hives and Swelling   Oxycodone Anaphylaxis    Other reaction(s): Other (See Comments) UNKNOWN  hives, trouble breathing, tongue swelling (Only Oxycontin) Tolerates plain oxycodone.   Oxycontin [Oxycodone Hcl] Anaphylaxis    hives, trouble breathing, tongue swelling (Only Oxycontin) Tolerates plain oxycodone.   Penicillins Anaphylaxis    Told by a surgeon never to take it again. Has patient had a PCN reaction causing immediate rash, facial/tongue/throat swelling, SOB or lightheadedness with hypotension: Yes Has patient had a PCN reaction causing severe rash involving mucus membranes or skin necrosis: Unknown Has patient had a PCN reaction that required hospitalization: No Has patient had a PCN reaction occurring within the last 10 years: No If all of the above answers are "NO", then may proceed with Cephalosporin use.   Aspirin Other (See Comments)    High-dose caused GI Bleeds   Darvocet [Propoxyphene N-Acetaminophen] Hives   Nitroglycerin Other (See Comments)    IV-BP drops dramatically Can take po   Propoxyphene Hives   Tramadol Hives and Itching   Ultram [Tramadol Hcl] Hives   Valium Other (See Comments)    Circulation problems. "Legs turned black".     Medications Reviewed Today     Reviewed by [name], RPH (Pharmacist) on 05/01/23 at 1523  Med List Status: <None>   Medication Order Taking? Sig Documenting Provider Last Dose Status Informant  albuterol (PROVENTIL) (2.5 MG/3ML) 0.083% nebulizer solution [id number] No Take 3 mLs (2.5 mg total) by  nebulization every 6 (six) hours as needed for wheezing or shortness of breath (J45.40). Roslynn Amble, MD Taking Active   albuterol (VENTOLIN HFA) 108 (90 Base) MCG/ACT inhaler [id number] No TAKE 2 PUFFS BY MOUTH EVERY 6 HOURS AS NEEDED FOR WHEEZE OR SHORTNESS OF BREATH Parrett, Tammy S, NP Taking Active   aspirin EC 81 MG tablet [id number] No Take 81 mg at bedtime by mouth.  [provider] Taking Active Self  atorvastatin (LIPITOR) 80 MG tablet [id number] No TAKE ONE TABLET BY MOUTH EVERYDAY AT BEDTIME Needs appointment for further refills Karie Georges, MD Taking Active   Blood Glucose Monitoring Suppl (ONE TOUCH ULTRA 2) w/Device KIT [id number] No Use as directed Wynn Banker, MD Taking Active   budesonide-formoterol (SYMBICORT) 80-4.5 MCG/ACT inhaler [id number] No Inhale 2 puffs into the lungs every morning and another 2 puffs 12 hours later. Parrett, Virgel Bouquet, NP Taking Active   buPROPion (WELLBUTRIN XL) 150 MG 24 hr tablet [id number]  Take 1 tablet (150 mg total) by mouth daily. Karie Georges, MD  Active   Calcium Carbonate-Vitamin D 600-200 MG-UNIT TABS [id number] No Take 1 tablet by mouth daily. [provider] Taking Active Self  CINNAMON PO [id number] No Take 1,000 mg 2 (two) times daily by mouth. [provider] Taking Active Self  dapagliflozin propanediol (FARXIGA) 10 MG TABS tablet [id number] No Take 1 tablet (10 mg total) by mouth daily before breakfast. Karie Georges, MD Taking Active   doxepin (SINEQUAN) 10 MG capsule [id number] No Take by mouth. [provider] Taking Active   famotidine (PEPCID) 20 MG tablet [id number] No  TAKE ONE TABLET BY MOUTH TWICE DAILY FOR INDIGESTION / heartburn Hilarie Fredrickson, MD Taking Active   fluticasone [facility]) 50 MCG/ACT nasal spray [id number] No Place into the nose. [provider] Taking Active   furosemide (LASIX) 20 MG tablet [id number] No 1 tablet daily as needed for ankle swelling Karie Georges, MD Taking Active   glipiZIDE (GLUCOTROL XL) 5 MG 24 hr tablet [id number] No TAKE ONE TABLET BY MOUTH EVERY MORNING Needs appointment for further refills Karie Georges, MD Taking Active   Glucosamine 750 MG TABS [id number] No Take by mouth. [provider] Taking Active   glucose blood test strip [id number] No Use as instructed Karie Georges, MD Taking Active   hydrALAZINE (APRESOLINE) 25 MG tablet [id number] No Take 1 tablet (25 mg total) by mouth daily as needed (for BP >140/90 mmHg, up to three times daily). Cantwell, Celeste C, PA-C Taking Expired 03/25/23 2359   hydrocortisone 2.5 % cream [id number] No Apply topically as needed. [provider] Taking Active   isosorbide mononitrate (IMDUR) 60 MG 24 hr tablet [id number] No TAKE ONE TABLET BY MOUTH EVERY MORNING Needs appointment for further refills Karie Georges, MD Taking Active   L-Methylfolate (DEPLIN) 7.5 MG TABS [id number] No Take 7.5 mg by mouth daily with breakfast. [provider] Taking Active Self  Lancets Letta Pate DELICA PLUS [place]) MISC [id number] No USE TO CHECK BLOOD SUGAR 2 TIMES A DAY Koberlein, Junell C, MD Taking Active   levothyroxine (SYNTHROID) 75 MCG tablet [id number] No Take 1 tablet (75 mcg total) by mouth daily before breakfast. Karie Georges, MD Taking Active   lisinopril (ZESTRIL) 5 MG tablet [id number]  TAKE ONE TABLET BY MOUTH EVERY MORNING Karie Georges, MD  Active   LYRICA 150 MG capsule [id number] No Take 150 mg by mouth 3 (three) times daily.  [provider] Taking Active Self  metFORMIN (GLUCOPHAGE) 500 MG tablet [id number] No TAKE ONE  TABLET BY MOUTH TWICE DAILY WITH A MEAL Needs appointment for further refills Karie Georges, MD Taking Active   metoprolol succinate (TOPROL-XL) 25 MG 24 hr tablet [id number] No Take 1 tablet (25 mg total) by mouth daily. TAKE WITH OR IMMEDIATELY FOLLOWING A MEAL. Cantwell, Celeste C, PA-C Taking Active   Misc Natural Products (GLUCOS-CHONDROIT-MSM COMPLEX PO) [id number] No Take 2 tablets by mouth daily. [provider] Taking Active Self  mometasone (ELOCON) 0.1 % cream [id number] No Apply topically. [provider] Taking Active   montelukast (SINGULAIR) 10 MG tablet [id number] No Take 1 tablet (10 mg total) by mouth at bedtime. Parrett, Virgel Bouquet, NP Taking Active   Multiple Vitamin (MULITIVITAMIN WITH MINERALS) TABS [id number] No Take 1 tablet by mouth daily. [provider] Taking Active Self  mupirocin cream (BACTROBAN) 2 % [id number] No Apply once daily to lesions on feet as needed. Freddie Breech, DPM Taking Active   NEXIUM 40 MG capsule [id number] No TAKE 1 CAPSULE BY MOUTH TWICE A DAY Hilarie Fredrickson, MD Taking Active   nitroGLYCERIN (NITROSTAT) 0.4 MG SL tablet [id number] No Place 1 tablet (0.4 mg total) under the tongue every 5 (five) minutes as needed for chest pain. Wynn Banker, MD Taking Active   NURTEC 75 MG TBDP [id number] No Take 1 each by mouth daily as needed. Karie Georges, MD Taking Active   ondansetron [facility]) 4 MG tablet [id number] No Take 1 tablet (4 mg total) by mouth every 8 (eight) hours as needed for nausea or vomiting. Hilarie Fredrickson, MD Taking Active   [facility] ULTRA test strip [id number] No USE TO CHECK BLOOD SUGAR 2 TIMES A DAY Koberlein, Junell C, MD Taking Active   oxyCODONE (ROXICODONE) 15 MG immediate release tablet [id number] No Take 1 tablet by mouth four times a day  Taking Active   potassium chloride (KLOR-CON) 10 MEQ tablet [id number] No TAKE TWO TABLETS BY MOUTH EVERY MORNING Karie Georges, MD Taking Active   promethazine  (PHENERGAN) 25 MG tablet [id number] No TAKE ONE TABLET BY MOUTH EVERYDAY AT BEDTIME Hilarie Fredrickson, MD Taking Active   tiZANidine (ZANAFLEX) 4 MG tablet [id number] No Take 4 mg by mouth every 8 (eight) hours as needed for muscle spasms. [provider] Taking Active Self  vitamin B-12 (CYANOCOBALAMIN) 500 MCG tablet [id number] No Take 500 mcg by mouth daily.  [provider] Taking Active Self  vitamin E 200 UNIT capsule [id number] No Take 200 Units by mouth daily. [provider] Taking Active             SDOH:  (Social Determinants of Health) assessments and interventions performed: Yes SDOH Interventions    Flowsheet Row Care Coordination from 05/01/2023 in [facility] Chronic Care Management from 12/17/2022 in [facility] [place] Chronic Care Management from 10/29/2022 in [facility] [place] Clinical Support from 08/07/2022 in [facility] [place] Chronic Care Management from 01/29/2022 in [facility] [place] Chronic Care Management from 07/17/2021 in [facility] [place]  SDOH Interventions        Food Insecurity Interventions Intervention Not Indicated Intervention Not Indicated -- Intervention Not Indicated Other (Comment)  Lavinia Sharps guide referral for food resources] --  Housing Interventions Intervention Not Indicated Intervention Not Indicated -- -- Intervention Not Indicated --  Transportation Interventions -- Intervention  Not Indicated -- Intervention Not Indicated Intervention Not Indicated Intervention Not Indicated  Utilities Interventions -- Intervention Not Indicated -- -- -- --  Alcohol Usage Interventions -- Intervention Not Indicated (Score <7) -- -- -- --  Depression Interventions/Treatment  -- -- -- Currently on Treatment -- --  Financial Strain Interventions -- Other (Comment)  [Declines current need for resources] Other  (Comment)  [reached out to [employer] for resources] Other (Comment)  Lavinia Sharps guide referral] Other (Comment)  Lavinia Sharps guide referral] Other (Comment)  [working on patient assistance]  Stress Interventions -- Intervention Not Indicated -- Intervention Not Indicated Intervention Not Indicated --  Social Connections Interventions -- Intervention Not Indicated -- -- -- --       Medication Assistance: None required.  Patient affirms current coverage meets needs.  Medication Access: Name and location of current pharmacy:  [facility] - [place], [place] - [address] [place] [address] Phone: [phone] Fax: [phone]  Within the past 30 days, how often has patient missed a dose of medication? None Is a pillbox or other method used to improve adherence? Yes  Factors that may affect medication adherence? hearing impairment Are meds synced by current pharmacy? Yes  Are meds delivered by current pharmacy? Yes  Does patient experience delays in picking up medications due to transportation concerns? No   Compliance/Adherence/Medication fill history: Care Gaps: AWV - completed 08/07/2022 Last eye exam - 04/23/2023 Last foot exam - 04/24/2023 Last BP - 119/72 on 03/25/2023 Last A1C - 6.9 on 01/29/2023 Covid - overdue Urine ACR- overdue Pap smear - postponed  Star-Rating Drugs: Atorvastatin 80 mg - last filled 04/09/2023 30 [facility] Glipizide 5 mg - last filled 04/09/2023 30 [facility] Lisinopril 5 mg - last filled 04/09/2023 30 [facility] Metformin 500 mg - last filled 04/09/2023 30 [facility] Farxiga 10 mg  - PAP    Assessment/Plan Hypertension (BP goal <130/80) -Controlled -Current treatment: Lasix 20mg  1 qd prn swelling Appropriate, Effective, Safe, Accessible Lisinopril 5mg  1 qam Appropriate, Effective, Safe, Accessible Hydralazine 25mg  1 qd prn for BP >140/90 up to TID Appropriate, Effective, Safe,  Accessible Metoprolol XL 25mg  1 qd Appropriate, Effective, Safe, Accessible -Medications previously tried: None  -Current home readings: 128/78 HR 87 -Current dietary habits: mindful of salt intake -Current exercise habits: not discussed -Denies hypotensive/hypertensive symptoms -Educated on BP goals and benefits of medications for prevention of heart attack, stroke and kidney damage; Daily salt intake goal < 2300 mg; Importance of home blood pressure monitoring; Proper BP monitoring technique; -Counseled to monitor BP at home daily, document, and provide log at future appointments -Counseled on diet and exercise extensively Recommended to continue current medication  Diabetes (A1c goal <7%) -Controlled -Current medications: Metformin 500mg  1 BID Appropriate, Effective, Safe, Accessible Glipizide XL 5mg  1 qam Appropriate, Effective, Safe, Accessible Farxiga 10mg  1 qd Appropriate, Effective, Safe, Accessible -Medications previously tried: Jardiance  -Current home glucose readings fasting glucose: 117 -Denies hypoglycemic/hyperglycemic symptoms -Current meal patterns:  Mindful of carb intake -Current exercise: Not discussed -Educated on A1c and blood sugar goals; Complications of diabetes including kidney damage, retinal damage, and cardiovascular disease; Prevention and management of hypoglycemic episodes; Benefits of routine self-monitoring of blood sugar; -Counseled to check feet daily and get yearly eye exams -Counseled on diet and exercise extensively Recommended to continue current medication  [name] Clinical Pharmacist [phone]

## 2023-04-30 NOTE — Progress Notes (Signed)
Care Management & Coordination Services Pharmacy Team  Reason for Encounter: Appointment Reminder  Contacted patient to confirm telephone appointment with [name], PharmD on 05/01/2023 at 3:45. Unsuccessful outreach. Left voicemail for patient to return call.   Care Gaps: AWV - completed 08/07/2022 Last eye exam - 04/23/2023 Last foot exam - 04/24/2023 Last BP - 119/72 on 03/25/2023 Last A1C - 6.9 on 01/29/2023 Covid - overdue Urine ACR- overdue Pap smear - postponed   Star Rating Drugs: Atorvastatin 80 mg - last filled 04/09/2023 30 [facility] Glipizide 5 mg - last filled 04/09/2023 30 [facility] Lisinopril 5 mg - last filled 04/09/2023 30 [facility] at [facility] Metformin 500 mg - last filled 04/09/2023 30 [facility] at [facility] Farxiga 10 mg  - PAP  [name] [facility]  Clinical Pharmacist Assistant [phone]

## 2023-05-01 ENCOUNTER — Ambulatory Visit: Payer: Medicare Other

## 2023-05-01 ENCOUNTER — Other Ambulatory Visit: Payer: Self-pay

## 2023-05-01 MED ORDER — HYDRALAZINE HCL 25 MG PO TABS: 25.0000 mg | ORAL_TABLET | Freq: Every day | ORAL | 11 refills | Status: DC | PRN

## 2023-05-01 MED ORDER — NITROGLYCERIN 0.4 MG SL SUBL
0.4000 mg | SUBLINGUAL_TABLET | SUBLINGUAL | 4 refills | Status: AC | PRN
  Filled 2023-11-25: qty 25, 1d supply, fill #0
  Filled 2023-12-02 – 2023-12-19 (×2): qty 25, 1d supply, fill #1
  Filled ????-??-??: fill #1

## 2023-05-01 NOTE — Telephone Encounter (Signed)
Please call pt and schedule f/u with Rozell Searing

## 2023-05-06 ENCOUNTER — Other Ambulatory Visit: Payer: Self-pay | Admitting: *Deleted

## 2023-05-06 ENCOUNTER — Other Ambulatory Visit: Payer: Self-pay | Admitting: Family Medicine

## 2023-05-06 ENCOUNTER — Encounter: Payer: Self-pay | Admitting: Internal Medicine

## 2023-05-06 ENCOUNTER — Ambulatory Visit: Payer: Medicare Other | Admitting: Internal Medicine

## 2023-05-06 VITALS — BP 104/64 | HR 61 | Ht 65.0 in | Wt 214.0 lb

## 2023-05-06 DIAGNOSIS — I951 Orthostatic hypotension: Secondary | ICD-10-CM

## 2023-05-06 DIAGNOSIS — M47816 Spondylosis without myelopathy or radiculopathy, lumbar region: Secondary | ICD-10-CM | POA: Diagnosis not present

## 2023-05-06 DIAGNOSIS — I10 Essential (primary) hypertension: Secondary | ICD-10-CM

## 2023-05-06 DIAGNOSIS — G893 Neoplasm related pain (acute) (chronic): Secondary | ICD-10-CM | POA: Diagnosis not present

## 2023-05-06 DIAGNOSIS — Z79891 Long term (current) use of opiate analgesic: Secondary | ICD-10-CM | POA: Diagnosis not present

## 2023-05-06 DIAGNOSIS — G894 Chronic pain syndrome: Secondary | ICD-10-CM | POA: Diagnosis not present

## 2023-05-06 DIAGNOSIS — R42 Dizziness and giddiness: Secondary | ICD-10-CM | POA: Diagnosis not present

## 2023-05-06 MED ORDER — ONETOUCH ULTRA VI STRP: 1.0000 | ORAL_STRIP | Freq: Two times a day (BID) | 3 refills | Status: DC

## 2023-05-06 NOTE — Progress Notes (Signed)
Primary Physician/Referring:  Karie Georges, MD  Patient ID: Shelby Anderson, female    DOB: 24-Sep-1956, 67 y.o.   MRN: [id number]  Chief Complaint  Patient presents with   Hypertension   Follow-up   HPI:    Shelby Anderson  is a 67 y.o. obese Caucasian female with history of NSTEMI with normal coronary arteries, diabetes mellitus, orthostatic hypotension, chronic chest pain syndrome, recurrent syncope status post loop recorder implantation by [facility] cardiology.  She also has history of chronic severe back pain, bronchial asthma and severe restrictive lung disease, severe GERD, distal esophageal stricture status post dilation, peptic ulcer disease, depression.  Patient is a retired Engineer, civil (consulting).    Patient presents for follow-up visit. She states someone called her to discuss an Lpa trial that she did not qualify for due to history of cancer. She has been feeling well overall. She does not have any concerns or complaints today. She will talk to our research team prior to leaving just to clarify that she did not qualify for the trial due to cancer history. Denies chest pain, shortness of breath, palpitations, diaphoresis, syncope, edema, PND, orthopnea.   Past Medical History:  Diagnosis Date   Allergy    Anemia    "chronic"   Angina    Prinzmetal angina   Anxiety and depression    Arthritis    Asthma    Atrial fibrillation (HCC)    h/o "AF w/frequent PVCs"   Breast cancer (HCC)    Left   Cancer (HCC)    hx of skin cancer    CHF (congestive heart failure) (HCC)    Chronic back pain greater than 3 months duration    on chronic narcotics, treated at pain clinic   Chronic respiratory failure (HCC) 09/15/2015   ONO 09/04/15 + desats >begin O2 at 2l/ m    Colon polyps    hyperplastic   Coronary artery disease    Arrythmia, orthostatic hypotension, HLD, HTN; sees Dr. Jacinto Halim   Difficult intubation    "TMJ & woke up when they were still cutting on me"   Dysrhythmia    sees  Dr. Jacinto Halim and a cardiologist at [facility]   Esophageal stricture    Family history of melanoma ]   Family history of pancreatic cancer    Fatty liver    Fatty liver    Fibroids    Fibromyalgia    "in my legs"   GERD (gastroesophageal reflux disease)    hx hiatal hernia, stricture and gastric ulcer   Headache(784.0)    migraines   Heart murmur    Hiatal hernia    History of loop recorder    History of migraines    "dx'd when I was in my teens"   Hyperlipemia    Hypertension    Mental disorder    Mild episode of recurrent major depressive disorder (HCC) 12/06/2015   Myocardial infarction [facility]) 1980's & 1990;   sees Dr. 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 asthma   Stenotic cervical os    Stomach ulcer    "3 small; found in 05/2011"   TMJ (  dislocation of temporomandibular joint)    Tuberculosis    + TB SKIN TEST   Type 2 diabetes mellitus without complication, without long-term current use of insulin (HCC) 12/06/2015   not on meds    Past Surgical History:  Procedure Laterality Date   ACHILLES TENDON REPAIR  1970's   left ankle   ARTHROSCOPIC REPAIR ACL     left knee cap   BREAST BIOPSY Right 04/08/2013   again in October or November 2020   BREAST LUMPECTOMY Left 12/09/2019   BREAST LUMPECTOMY WITH RADIOACTIVE SEED AND SENTINEL LYMPH NODE BIOPSY Left 12/09/2019   Procedure: LEFT BREAST LUMPECTOMY WITH RADIOACTIVE SEED AND LEFT AXILLARY SENTINEL LYMPH NODE BIOPSY;  Surgeon: Emelia Loron, MD;  Location: [facility] OR;  Service: General;  Laterality: Left;  PEC BLOCK   CARDIAC CATHETERIZATION     loop recorder   CARPAL TUNNEL RELEASE  unknown   left hand   COLONOSCOPY      ESOPHAGOGASTRODUODENOSCOPY (EGD) WITH PROPOFOL N/A 10/29/2017   Procedure: ESOPHAGOGASTRODUODENOSCOPY (EGD) WITH PROPOFOL;  Surgeon: Hilarie Fredrickson, MD;  Location: [facility] ENDOSCOPY;  Service: Endoscopy;  Laterality: N/A;   LOOP RECORDER IMPLANT     PORT-A-CATH REMOVAL N/A 12/09/2019   Procedure: REMOVAL PORT-A-CATH;  Surgeon: Emelia Loron, MD;  Location: [facility] OR;  Service: General;  Laterality: N/A;   PORTACATH PLACEMENT N/A 09/23/2019   Procedure: INSERTION PORT-A-CATH Right Internal Feliberto Harts;  Surgeon: Emelia Loron, MD;  Location: [facility] OR;  Service: General;  Laterality: N/A;   post ganglionectomy  1970's   "for migraine headaches"   pouch string  42,10,27   "did this 3 times (once w/each pregnancy)"   SAVORY DILATION N/A 10/29/2017   Procedure: SAVORY DILATION;  Surgeon: Hilarie Fredrickson, MD;  Location: [facility] ENDOSCOPY;  Service: Endoscopy;  Laterality: N/A;   TOTAL KNEE ARTHROPLASTY Left 09/25/2016   Procedure: LEFT TOTAL KNEE ARTHROPLASTY;  Surgeon: Durene Romans, MD;  Location: [facility] ORS;  Service: Orthopedics;  Laterality: Left;   TUBAL LIGATION  1980's   Family History  Problem Relation Age of Onset   Malignant hyperthermia Father    Hypertension Father    Heart disease Father    Diabetes Father    Cancer Father        skin   Hypertension Mother    Heart disease Mother    Multiple myeloma Mother    Cancer Sister        CERVICAL   Hypertension Sister    Cancer Brother 50       MELANOMA   Heart disease Maternal Grandmother    Other Maternal Grandmother 32       complications of childbirth   Heart disease Maternal Grandfather    Cancer Paternal Grandmother        ?    Heart disease Paternal Grandmother    Heart disease Paternal Grandfather    Cancer Brother        LUNG   Diabetes Sister    Hypertension Sister    Heart disease Sister    Cancer Sister    Cancer Brother    Pancreatic cancer Niece 70   Cancer Nephew 40       unknown- currently in the [employer]    Anesthesia problems Neg Hx    Hypotension Neg Hx    Pseudochol deficiency Neg Hx    Colon cancer Neg Hx    Esophageal cancer Neg Hx    Rectal cancer Neg Hx    Stomach cancer Neg  Hx    Breast cancer Neg Hx     Social History   Tobacco Use   Smoking status: Never   Smokeless tobacco: Never  Substance Use Topics   Alcohol use: No    Alcohol/week: 0.0 standard drinks of alcohol   Marital Status: Married   ROS  Review of Systems  Constitutional: Negative for malaise/fatigue and weight gain.  Cardiovascular:  Negative for chest pain (no recurrence), claudication, leg swelling, near-syncope, orthopnea, palpitations, paroxysmal nocturnal dyspnea and syncope.  Respiratory:  Negative for shortness of breath.   Hematologic/Lymphatic: Does not bruise/bleed easily.  Gastrointestinal:  Negative for melena.  Neurological:  Negative for dizziness and weakness.    Objective  Blood pressure 104/64, pulse 61, height 5\' 5"  (1.651 m), weight 214 lb (97.1 kg), SpO2 96 %.     05/06/2023    8:43 AM 03/25/2023    1:28 PM 02/05/2023    3:51 PM  Vitals with BMI  Height 5\' 5"  5\' 5"  5\' 5"   Weight 214 lbs 217 lbs 215 lbs 14 oz  BMI 35.61 36.11 35.93  Systolic 104 119 644  Diastolic 64 72 80  Pulse 61 72 73     Physical Exam Vitals reviewed.  Constitutional:      Appearance: She is obese.  Cardiovascular:     Rate and Rhythm: Normal rate and regular rhythm.     Pulses: Intact distal pulses.          Carotid pulses are 2+ on the right side and 2+ on the left side.      Radial pulses are 2+ on the right side and 2+ on the left side.       Dorsalis pedis pulses are 2+ on the right side and 2+ on the left side.       Posterior tibial pulses are 1+ on the right side and 1+ on the left side.     Heart sounds: S1 normal and S2 normal. No murmur heard.    No gallop.     Comments: Femoral and popliteal pulses difficult to evaluate due to patient body habitus. Pulmonary:     Effort: Pulmonary effort  is normal. No respiratory distress.     Breath sounds: No wheezing, rhonchi or rales.  Musculoskeletal:     Right lower leg: No edema.     Left lower leg: No edema.  Neurological:     Mental Status: She is alert.     Laboratory examination:   Recent Labs    01/29/23 1030  NA 141  K 3.9  CL 102  CO2 32  GLUCOSE 94  BUN 21  CREATININE 0.81  CALCIUM 9.6   CrCl cannot be calculated (Patient's most recent lab result is older than the maximum 21 days allowed.).     Latest Ref Rng & Units 01/29/2023   10:30 AM 01/25/2022    1:11 PM 02/27/2021    1:00 PM  CMP  Glucose 70 - 99 mg/dL 94  034  89   BUN 6 - 23 mg/dL 21  16  12    Creatinine 0.40 - 1.20 mg/dL 7.42  5.95  6.38   Sodium 135 - 145 mEq/L 141  140  142   Potassium 3.5 - 5.1 mEq/L 3.9  4.1  4.1   Chloride 96 - 112 mEq/L 102  101  101   CO2 19 - 32 mEq/L 32  31  34   Calcium 8.4 - 10.5 mg/dL 9.6  9.5  9.0  Total Protein 6.0 - 8.3 g/dL 7.2  7.3  6.5   Total Bilirubin 0.2 - 1.2 mg/dL 0.3  0.5  0.4   Alkaline Phos 39 - 117 U/L 78  76  68   AST 0 - 37 U/L 24  25  18    ALT 0 - 35 U/L 21  30  18        Latest Ref Rng & Units 01/25/2022    1:11 PM 02/27/2021    1:00 PM 09/30/2020    3:33 PM  CBC  WBC 4.0 - 10.5 K/uL 8.0  5.5  5.5   Hemoglobin 12.0 - 15.0 g/dL 60.4  54.0  98.1   Hematocrit 36.0 - 46.0 % 40.3  38.1  42.8   Platelets 150.0 - 400.0 K/uL 271.0  222.0  271     Lipid Panel Recent Labs    01/29/23 1030  CHOL 110  TRIG 72.0  LDLCALC 52  VLDL 14.4  HDL 43.70  CHOLHDL 3    HEMOGLOBIN A1C Lab Results  Component Value Date   HGBA1C 6.9 (H) 01/29/2023   MPG 160 09/30/2020   TSH Recent Labs    01/29/23 1030  TSH 3.13    External labs:   None   Allergies   Allergies  Allergen Reactions   Lodine [Etodolac] Anaphylaxis, Hives and Swelling   Oxycodone Anaphylaxis    Other reaction(s): Other (See Comments) UNKNOWN  hives, trouble breathing, tongue swelling (Only Oxycontin) Tolerates plain  oxycodone.   Oxycontin [Oxycodone Hcl] Anaphylaxis    hives, trouble breathing, tongue swelling (Only Oxycontin) Tolerates plain oxycodone.   Penicillins Anaphylaxis    Told by a surgeon never to take it again. Has patient had a PCN reaction causing immediate rash, facial/tongue/throat swelling, SOB or lightheadedness with hypotension: Yes Has patient had a PCN reaction causing severe rash involving mucus membranes or skin necrosis: Unknown Has patient had a PCN reaction that required hospitalization: No Has patient had a PCN reaction occurring within the last 10 years: No If all of the above answers are "NO", then may proceed with Cephalosporin use.   Aspirin Other (See Comments)    High-dose caused GI Bleeds   Darvocet [Propoxyphene N-Acetaminophen] Hives   Nitroglycerin Other (See Comments)    IV-BP drops dramatically Can take po   Propoxyphene Hives   Tramadol Hives and Itching   Ultram [Tramadol Hcl] Hives   Valium Other (See Comments)    Circulation problems. "Legs turned black".    Medications Prior to Visit:   Outpatient Medications Prior to Visit  Medication Sig Dispense Refill   albuterol (PROVENTIL) (2.5 MG/3ML) 0.083% nebulizer solution Take 3 mLs (2.5 mg total) by nebulization every 6 (six) hours as needed for wheezing or shortness of breath (J45.40). 75 mL 3   albuterol (VENTOLIN HFA) 108 (90 Base) MCG/ACT inhaler TAKE 2 PUFFS BY MOUTH EVERY 6 HOURS AS NEEDED FOR WHEEZE OR SHORTNESS OF BREATH 20.1 each 3   aspirin EC 81 MG tablet Take 81 mg at bedtime by mouth.      atorvastatin (LIPITOR) 80 MG tablet TAKE ONE TABLET BY MOUTH EVERYDAY AT BEDTIME Needs appointment for further refills 90 tablet 1   Blood Glucose Monitoring Suppl (ONE TOUCH ULTRA 2) w/Device KIT Use as directed 1 kit 1   budesonide-formoterol (SYMBICORT) 80-4.5 MCG/ACT inhaler Inhale 2 puffs into the lungs every morning and another 2 puffs 12 hours later. 3 each 3   buPROPion (WELLBUTRIN XL) 150 MG 24 hr  tablet Take  1 tablet (150 mg total) by mouth daily. 90 tablet 1   Calcium Carbonate-Vitamin D 600-200 MG-UNIT TABS Take 1 tablet by mouth daily.     CINNAMON PO Take 1,000 mg 2 (two) times daily by mouth.     dapagliflozin propanediol (FARXIGA) 10 MG TABS tablet Take 1 tablet (10 mg total) by mouth daily before breakfast. 90 tablet 3   doxepin (SINEQUAN) 10 MG capsule Take by mouth.     famotidine (PEPCID) 20 MG tablet TAKE ONE TABLET BY MOUTH TWICE DAILY FOR INDIGESTION / heartburn 180 tablet 1   fluticasone (FLONASE) 50 MCG/ACT nasal spray Place into the nose.     furosemide (LASIX) 20 MG tablet 1 tablet daily as needed for ankle swelling 180 tablet 1   glipiZIDE (GLUCOTROL XL) 5 MG 24 hr tablet TAKE ONE TABLET BY MOUTH EVERY MORNING Needs appointment for further refills 90 tablet 1   Glucosamine 750 MG TABS Take by mouth.     glucose blood test strip Use as instructed 100 each 3   hydrALAZINE (APRESOLINE) 25 MG tablet Take 1 tablet (25 mg total) by mouth daily as needed (for BP >140/90 mmHg, up to three times daily). 30 tablet 11   hydrocortisone 2.5 % cream Apply topically as needed.     isosorbide mononitrate (IMDUR) 60 MG 24 hr tablet TAKE ONE TABLET BY MOUTH EVERY MORNING Needs appointment for further refills 90 tablet 1   L-Methylfolate (DEPLIN) 7.5 MG TABS Take 7.5 mg by mouth daily with breakfast.     Lancets (ONETOUCH DELICA PLUS LANCET30G) MISC USE TO CHECK BLOOD SUGAR 2 TIMES A DAY 100 each 1   levothyroxine (SYNTHROID) 75 MCG tablet Take 1 tablet (75 mcg total) by mouth daily before breakfast. 90 tablet 3   lisinopril (ZESTRIL) 5 MG tablet TAKE ONE TABLET BY MOUTH EVERY MORNING 90 tablet 1   LYRICA 150 MG capsule Take 150 mg by mouth 3 (three) times daily.   2   metFORMIN (GLUCOPHAGE) 500 MG tablet TAKE ONE TABLET BY MOUTH TWICE DAILY WITH A MEAL Needs appointment for further refills 180 tablet 1   metoprolol succinate (TOPROL-XL) 25 MG 24 hr tablet Take 1 tablet (25 mg total) by  mouth daily. TAKE WITH OR IMMEDIATELY FOLLOWING A MEAL. 90 tablet 3   Misc Natural Products (GLUCOS-CHONDROIT-MSM COMPLEX PO) Take 2 tablets by mouth daily.     mometasone (ELOCON) 0.1 % cream Apply topically.     montelukast (SINGULAIR) 10 MG tablet Take 1 tablet (10 mg total) by mouth at bedtime. 90 tablet 3   Multiple Vitamin (MULITIVITAMIN WITH MINERALS) TABS Take 1 tablet by mouth daily.     mupirocin cream (BACTROBAN) 2 % Apply once daily to lesions on feet as needed. 30 g 1   NEXIUM 40 MG capsule TAKE 1 CAPSULE BY MOUTH TWICE A DAY 180 capsule 0   nitroGLYCERIN (NITROSTAT) 0.4 MG SL tablet Place 1 tablet (0.4 mg total) under the tongue every 5 (five) minutes as needed for chest pain. 10 tablet 6   NURTEC 75 MG TBDP Take 1 each by mouth daily as needed. 2 tablet 0   ondansetron (ZOFRAN) 4 MG tablet Take 1 tablet (4 mg total) by mouth every 8 (eight) hours as needed for nausea or vomiting. 80 tablet 2   ONETOUCH ULTRA test strip USE TO CHECK BLOOD SUGAR 2 TIMES A DAY 100 strip 3   oxyCODONE (ROXICODONE) 15 MG immediate release tablet Take 1 tablet by mouth four times  a day 120 tablet 0   potassium chloride (KLOR-CON) 10 MEQ tablet TAKE TWO TABLETS BY MOUTH EVERY MORNING 180 tablet 1   promethazine (PHENERGAN) 25 MG tablet TAKE ONE TABLET BY MOUTH EVERYDAY AT BEDTIME 90 tablet 3   tiZANidine (ZANAFLEX) 4 MG tablet Take 4 mg by mouth every 8 (eight) hours as needed for muscle spasms.     vitamin B-12 (CYANOCOBALAMIN) 500 MCG tablet Take 500 mcg by mouth daily.      vitamin E 200 UNIT capsule Take 200 Units by mouth daily.     No facility-administered medications prior to visit.   Final Medications at End of Visit    Current Meds  Medication Sig   albuterol (PROVENTIL) (2.5 MG/3ML) 0.083% nebulizer solution Take 3 mLs (2.5 mg total) by nebulization every 6 (six) hours as needed for wheezing or shortness of breath (J45.40).   albuterol (VENTOLIN HFA) 108 (90 Base) MCG/ACT inhaler TAKE 2  PUFFS BY MOUTH EVERY 6 HOURS AS NEEDED FOR WHEEZE OR SHORTNESS OF BREATH   aspirin EC 81 MG tablet Take 81 mg at bedtime by mouth.    atorvastatin (LIPITOR) 80 MG tablet TAKE ONE TABLET BY MOUTH EVERYDAY AT BEDTIME Needs appointment for further refills   Blood Glucose Monitoring Suppl (ONE TOUCH ULTRA 2) w/Device KIT Use as directed   budesonide-formoterol (SYMBICORT) 80-4.5 MCG/ACT inhaler Inhale 2 puffs into the lungs every morning and another 2 puffs 12 hours later.   buPROPion (WELLBUTRIN XL) 150 MG 24 hr tablet Take 1 tablet (150 mg total) by mouth daily.   Calcium Carbonate-Vitamin D 600-200 MG-UNIT TABS Take 1 tablet by mouth daily.   CINNAMON PO Take 1,000 mg 2 (two) times daily by mouth.   dapagliflozin propanediol (FARXIGA) 10 MG TABS tablet Take 1 tablet (10 mg total) by mouth daily before breakfast.   doxepin (SINEQUAN) 10 MG capsule Take by mouth.   famotidine (PEPCID) 20 MG tablet TAKE ONE TABLET BY MOUTH TWICE DAILY FOR INDIGESTION / heartburn   fluticasone (FLONASE) 50 MCG/ACT nasal spray Place into the nose.   furosemide (LASIX) 20 MG tablet 1 tablet daily as needed for ankle swelling   glipiZIDE (GLUCOTROL XL) 5 MG 24 hr tablet TAKE ONE TABLET BY MOUTH EVERY MORNING Needs appointment for further refills   Glucosamine 750 MG TABS Take by mouth.   glucose blood test strip Use as instructed   hydrALAZINE (APRESOLINE) 25 MG tablet Take 1 tablet (25 mg total) by mouth daily as needed (for BP >140/90 mmHg, up to three times daily).   hydrocortisone 2.5 % cream Apply topically as needed.   isosorbide mononitrate (IMDUR) 60 MG 24 hr tablet TAKE ONE TABLET BY MOUTH EVERY MORNING Needs appointment for further refills   L-Methylfolate (DEPLIN) 7.5 MG TABS Take 7.5 mg by mouth daily with breakfast.   Lancets (ONETOUCH DELICA PLUS LANCET30G) MISC USE TO CHECK BLOOD SUGAR 2 TIMES A DAY   levothyroxine (SYNTHROID) 75 MCG tablet Take 1 tablet (75 mcg total) by mouth daily before breakfast.    lisinopril (ZESTRIL) 5 MG tablet TAKE ONE TABLET BY MOUTH EVERY MORNING   LYRICA 150 MG capsule Take 150 mg by mouth 3 (three) times daily.    metFORMIN (GLUCOPHAGE) 500 MG tablet TAKE ONE TABLET BY MOUTH TWICE DAILY WITH A MEAL Needs appointment for further refills   metoprolol succinate (TOPROL-XL) 25 MG 24 hr tablet Take 1 tablet (25 mg total) by mouth daily. TAKE WITH OR IMMEDIATELY FOLLOWING A MEAL.  Misc Natural Products (GLUCOS-CHONDROIT-MSM COMPLEX PO) Take 2 tablets by mouth daily.   mometasone (ELOCON) 0.1 % cream Apply topically.   montelukast (SINGULAIR) 10 MG tablet Take 1 tablet (10 mg total) by mouth at bedtime.   Multiple Vitamin (MULITIVITAMIN WITH MINERALS) TABS Take 1 tablet by mouth daily.   mupirocin cream (BACTROBAN) 2 % Apply once daily to lesions on feet as needed.   NEXIUM 40 MG capsule TAKE 1 CAPSULE BY MOUTH TWICE A DAY   nitroGLYCERIN (NITROSTAT) 0.4 MG SL tablet Place 1 tablet (0.4 mg total) under the tongue every 5 (five) minutes as needed for chest pain.   NURTEC 75 MG TBDP Take 1 each by mouth daily as needed.   ondansetron (ZOFRAN) 4 MG tablet Take 1 tablet (4 mg total) by mouth every 8 (eight) hours as needed for nausea or vomiting.   ONETOUCH ULTRA test strip USE TO CHECK BLOOD SUGAR 2 TIMES A DAY   oxyCODONE (ROXICODONE) 15 MG immediate release tablet Take 1 tablet by mouth four times a day   potassium chloride (KLOR-CON) 10 MEQ tablet TAKE TWO TABLETS BY MOUTH EVERY MORNING   promethazine (PHENERGAN) 25 MG tablet TAKE ONE TABLET BY MOUTH EVERYDAY AT BEDTIME   tiZANidine (ZANAFLEX) 4 MG tablet Take 4 mg by mouth every 8 (eight) hours as needed for muscle spasms.   vitamin B-12 (CYANOCOBALAMIN) 500 MCG tablet Take 500 mcg by mouth daily.    vitamin E 200 UNIT capsule Take 200 Units by mouth daily.   Radiology:   No results found.  Cardiac Studies:  PCV ECHOCARDIOGRAM COMPLETE 09/27/2021 Normal LV systolic function with visual EF 60-65%. Left ventricle  cavity is normal in size. Mild left ventricular hypertrophy. Normal global wall motion. Normal diastolic filling pattern, normal LAP. No significant valvular heart disease. Compared to study 10/01/2019 no significant change.   PCV MYOCARDIAL PERFUSION WO LEXISCAN 09/27/2021 Normal ECG stress. The patient exercised for 7 minutes and 13 seconds of a Bruce protocol, achieving approximately 5.91 METs. Normal BP response. Reduced exercise tolerance. Breast tissue attenuation noted in the inferior wall.  Normal myocardial perfusion without ischemi aor scar. Gated SPECT imaging of the left ventricle was normal. All segments of left ventricle demonstrated normal wall motion and thickening. Normal left ventricle.  . No stress lung uptake. TID is normal. Stress LV EF is normal 58%. No previous exam available for comparison. Low risk study.  Echocardiogram 07/16/2017: Left ventricle cavity is normal in size.  Normal global wall motion.  Calculated EF 62%. Moderate biatrial enlargement Moderate RV enlargement with preserved systolic function No significant change compared to prior study 01/2017.  Assessment of pulmonary systolic pressure is again limited due to inadequate TR jet visualization  Heart catheterization 04/07/2010: Normal coronary arteries, LVEF 65%  Carotid artery duplex 04/23/2023:  The bifurcation, internal, external and common carotid arteries reveal no  evidence of significant stenosis, bilaterally.  There is minimal plaque noted in bilateral carotid arteries.  Antegrade right vertebral artery flow. Antegrade left vertebral artery  flow.   EKG:  03/19/2022: Sinus rhythm with borderline first-degree AV block at a rate of 61 bpm.  Left axis.  Poor R wave progression, cannot exclude anteroseptal infarct old.   09/12/2021: Sinus rhythm at a rate of 74 bpm.  Normal axis.  Poor R wave progression, cannot exclude anteroseptal infarct old.  Cannot exclude inferior infarct old.  Nonspecific T  wave abnormality.  EKG 01/22/2021: Sinus rhythm at a rate of 86 bpm.  Left axis,  left anterior fascicular block.  Poor progression, cannot exclude anteroseptal infarct old.  Nonspecific T wave abnormality.  Low voltage complexes, consider pulmonary disease pattern.  Compared to EKG 01/30/2018, no significant change.  EKG 01/30/2018: Normal sinus rhythm at a rate of 65 bpm, left axis deviation, left anterior fascicular block.  Poor progression, cannot exclude anteroseptal infarct old.  Nonspecific T abnormality.  No significant change from 07/31/2017.  Assessment     ICD-10-CM   1. Orthostatic hypotension  I95.1     2. Essential hypertension  I10     3. Dizziness  R42       There are no discontinued medications.   No orders of the defined types were placed in this encounter.   Recommendations:   Shelby Anderson is a 67 y.o. obese Caucasian female with history of NSTEMI with normal coronary arteries, diabetes mellitus, orthostatic hypotension, chronic chest pain syndrome, recurrent syncope status post loop recorder implantation by [facility] cardiology.  She also has history of chronic severe back pain, bronchial asthma and severe restrictive lung disease, severe GERD, distal esophageal stricture status post dilation, peptic ulcer disease, depression.  Patient is a retired Engineer, civil (consulting).  Orthostatic hypotension Essential hypertension She is not having further  symptoms of feeling dizzy or light-headed Blood pressure is very well controlled She should keep the follow-up visit she already has scheduled   Dizziness Carotid duplex reviewed with patient Carotid artery duplex 04/23/2023: The bifurcation, internal, external and common carotid arteries reveal no evidence of significant stenosis, bilaterally. There is minimal plaque noted in bilateral carotid arteries. Antegrade right vertebral artery flow. Antegrade left vertebral artery flow.      Clotilde Dieter, DO 05/06/2023, 1:33  PM Office: [phone]

## 2023-05-07 ENCOUNTER — Other Ambulatory Visit: Payer: Self-pay | Admitting: Internal Medicine

## 2023-05-08 ENCOUNTER — Telehealth: Payer: Self-pay | Admitting: Internal Medicine

## 2023-05-08 MED ORDER — FAMOTIDINE 20 MG PO TABS: ORAL_TABLET | ORAL | 1 refills | Status: DC

## 2023-05-08 NOTE — Telephone Encounter (Signed)
Pepcid refilled

## 2023-05-08 NOTE — Telephone Encounter (Signed)
Inbound call from [facility] with [facility] requesting medication refill on Famotine. Please advise.

## 2023-05-09 ENCOUNTER — Other Ambulatory Visit: Payer: Self-pay | Admitting: Internal Medicine

## 2023-05-09 ENCOUNTER — Other Ambulatory Visit: Payer: Self-pay

## 2023-05-09 MED ORDER — METOPROLOL SUCCINATE ER 25 MG PO TB24
25.0000 mg | ORAL_TABLET | Freq: Every day | ORAL | 3 refills | Status: DC
  Filled 2023-08-27: qty 30, 30d supply, fill #0
  Filled 2023-09-24: qty 30, 30d supply, fill #1
  Filled 2023-11-20 – 2023-11-25 (×2): qty 30, 30d supply, fill #2
  Filled 2023-12-02 – 2023-12-19 (×3): qty 30, 30d supply, fill #3
  Filled 2024-01-07 – 2024-01-14 (×2): qty 30, 30d supply, fill #4
  Filled 2024-01-28 – 2024-02-11 (×2): qty 30, 30d supply, fill #5
  Filled 2024-02-17 – 2024-03-10 (×3): qty 30, 30d supply, fill #6
  Filled 2024-03-23 – 2024-04-02 (×2): qty 30, 30d supply, fill #7

## 2023-05-10 ENCOUNTER — Ambulatory Visit (INDEPENDENT_AMBULATORY_CARE_PROVIDER_SITE_OTHER): Payer: Medicare Other

## 2023-05-10 ENCOUNTER — Ambulatory Visit: Payer: Medicare Other | Admitting: Podiatry

## 2023-05-10 DIAGNOSIS — M21621 Bunionette of right foot: Secondary | ICD-10-CM

## 2023-05-10 DIAGNOSIS — M2042 Other hammer toe(s) (acquired), left foot: Secondary | ICD-10-CM

## 2023-05-10 DIAGNOSIS — M2041 Other hammer toe(s) (acquired), right foot: Secondary | ICD-10-CM | POA: Diagnosis not present

## 2023-05-10 NOTE — Progress Notes (Signed)
Subjective:  Patient ID: Shelby Anderson, female    DOB: December 08, 1955,  MRN: [id number]  Chief Complaint  Patient presents with   Bunions    Patient is concerned about the bilateral bunion and hammer toes. Patient states this has been an issue for the last 6 months. Aggravates factors are when she's on her feet walking. No treatment     66 y.o. female presents with the above complaint.  Patient presents with bilateral fifth metatarsal plantarflexed metatarsal with underlying submetatarsal 5 pain.  Patient has been on for quite some time is progressive and worsens with ambulation with pressure she has failed all conservative care with Dr. Donzetta Matters.  She would like to discuss surgical options at this time.  She has failed shoe gear modification padding protecting offloading   Review of Systems: Negative except as noted in the HPI. Denies N/V/F/Ch.  Past Medical History:  Diagnosis Date   Allergy    Anemia    "chronic"   Angina    Prinzmetal angina   Anxiety and depression    Arthritis    Asthma    Atrial fibrillation (HCC)    h/o "AF w/frequent PVCs"   Breast cancer (HCC)    Left   Cancer (HCC)    hx of skin cancer    CHF (congestive heart failure) (HCC)    Chronic back pain greater than 3 months duration    on chronic narcotics, treated at pain clinic   Chronic respiratory failure (HCC) 09/15/2015   ONO 09/04/15 + desats >begin O2 at 2l/ m    Colon polyps    hyperplastic   Coronary artery disease    Arrythmia, orthostatic hypotension, HLD, HTN; sees Dr. Jacinto Halim   Difficult intubation    "TMJ & woke up when they were still cutting on me"   Dysrhythmia    sees Dr. 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 asthma   Stenotic cervical os    Stomach ulcer    "3 small; found in 05/2011"   TMJ (dislocation of temporomandibular joint)    Tuberculosis    + TB SKIN TEST   Type 2 diabetes mellitus without complication, without long-term current use of insulin (HCC) 12/06/2015   not on meds     Current Outpatient Medications:    albuterol (PROVENTIL) (2.5 MG/3ML)  0.083% nebulizer solution, Take 3 mLs (2.5 mg total) by nebulization every 6 (six) hours as needed for wheezing or shortness of breath (J45.40)., Disp: 75 mL, Rfl: 3   albuterol (VENTOLIN HFA) 108 (90 Base) MCG/ACT inhaler, TAKE 2 PUFFS BY MOUTH EVERY 6 HOURS AS NEEDED FOR WHEEZE OR SHORTNESS OF BREATH, Disp: 20.1 each, Rfl: 3   aspirin EC 81 MG tablet, Take 81 mg at bedtime by mouth. , Disp: , Rfl:    atorvastatin (LIPITOR) 80 MG tablet, TAKE ONE TABLET BY MOUTH EVERYDAY AT BEDTIME Needs appointment for further refills, Disp: 90 tablet, Rfl: 1   Blood Glucose Monitoring Suppl (ONE TOUCH ULTRA 2) w/Device KIT, Use as directed, Disp: 1 kit, Rfl: 1   budesonide-formoterol (SYMBICORT) 80-4.5 MCG/ACT inhaler, Inhale 2 puffs into the lungs every morning and another 2 puffs 12 hours later., Disp: 3 each, Rfl: 3   buPROPion (WELLBUTRIN XL) 150 MG 24 hr tablet, Take 1 tablet (150 mg total) by mouth  daily., Disp: 90 tablet, Rfl: 1   Calcium Carbonate-Vitamin D 600-200 MG-UNIT TABS, Take 1 tablet by mouth daily., Disp: , Rfl:    CINNAMON PO, Take 1,000 mg 2 (two) times daily by mouth., Disp: , Rfl:    dapagliflozin propanediol (FARXIGA) 10 MG TABS tablet, Take 1 tablet (10 mg total) by mouth daily before breakfast., Disp: 90 tablet, Rfl: 3   doxepin (SINEQUAN) 10 MG capsule, Take by mouth., Disp: , Rfl:    famotidine (PEPCID) 20 MG tablet, TAKE ONE TABLET BY MOUTH TWICE DAILY FOR INDIGESTION / heartburn, Disp: 180 tablet, Rfl: 1   fluticasone (FLONASE) 50 MCG/ACT nasal spray, Place into the nose., Disp: , Rfl:    furosemide (LASIX) 20 MG tablet, 1 tablet daily as needed for ankle swelling, Disp: 180 tablet, Rfl: 1   glipiZIDE (GLUCOTROL XL) 5 MG 24 hr tablet, TAKE ONE TABLET BY MOUTH EVERY MORNING Needs appointment for further refills, Disp: 90 tablet, Rfl: 1   Glucosamine 750 MG TABS, Take by mouth., Disp: , Rfl:    glucose blood (ONETOUCH ULTRA) test strip, 1 each by Other route 2 (two) times daily., Disp: 100 strip, Rfl: 3   hydrALAZINE (APRESOLINE) 25 MG tablet, Take 1 tablet (25 mg total) by mouth daily as needed (for BP >140/90 mmHg, up to three times daily)., Disp: 30 tablet, Rfl: 11   hydrocortisone 2.5 % cream, Apply topically as needed., Disp: , Rfl:    isosorbide mononitrate (IMDUR) 60 MG 24 hr tablet, TAKE ONE TABLET BY MOUTH EVERY MORNING Needs appointment for further refills, Disp: 90 tablet, Rfl: 1   L-Methylfolate (DEPLIN) 7.5 MG TABS, Take 7.5 mg by mouth daily with breakfast., Disp: , Rfl:    Lancets (ONETOUCH DELICA PLUS LANCET30G) MISC, USE TO CHECK BLOOD SUGAR 2 TIMES A DAY, Disp: 100 each, Rfl: 1   levothyroxine (SYNTHROID) 75 MCG tablet, Take 1 tablet (75 mcg total) by mouth daily before breakfast., Disp: 90 tablet, Rfl: 3   lisinopril (ZESTRIL) 5 MG tablet, TAKE ONE TABLET BY MOUTH EVERY MORNING, Disp: 90 tablet, Rfl: 1   LYRICA 150 MG capsule, Take 150 mg by mouth 3  (three) times daily. , Disp: , Rfl: 2   metFORMIN (GLUCOPHAGE) 500 MG tablet, TAKE ONE TABLET BY MOUTH TWICE DAILY WITH A MEAL Needs appointment for further refills, Disp: 180 tablet, Rfl: 1   metoprolol succinate (TOPROL-XL) 25 MG 24 hr tablet, Take 1 tablet (25 mg total) by mouth daily. TAKE WITH OR IMMEDIATELY FOLLOWING A  MEAL., Disp: 90 tablet, Rfl: 3   Misc Natural Products (GLUCOS-CHONDROIT-MSM COMPLEX PO), Take 2 tablets by mouth daily., Disp: , Rfl:    mometasone (ELOCON) 0.1 % cream, Apply topically., Disp: , Rfl:    montelukast (SINGULAIR) 10 MG tablet, Take 1 tablet (10 mg total) by mouth at bedtime., Disp: 90 tablet, Rfl: 3   Multiple Vitamin (MULITIVITAMIN WITH MINERALS) TABS, Take 1 tablet by mouth daily., Disp: , Rfl:    mupirocin cream (BACTROBAN) 2 %, Apply once daily to lesions on feet as needed., Disp: 30 g, Rfl: 1   NEXIUM 40 MG capsule, TAKE 1 CAPSULE BY MOUTH TWICE A DAY, Disp: 180 capsule, Rfl: 0   nitroGLYCERIN (NITROSTAT) 0.4 MG SL tablet, Place 1 tablet (0.4 mg total) under the tongue every 5 (five) minutes as needed for chest pain., Disp: 10 tablet, Rfl: 6   NURTEC 75 MG TBDP, Take 1 each by mouth daily as needed., Disp: 2 tablet, Rfl: 0   ondansetron (ZOFRAN) 4 MG tablet, Take 1 tablet (4 mg total) by mouth every 8 (eight) hours as needed for nausea or vomiting., Disp: 80 tablet, Rfl: 2   ONETOUCH ULTRA test strip, USE TO CHECK BLOOD SUGAR 2 TIMES A DAY, Disp: 100 strip, Rfl: 3   oxyCODONE (ROXICODONE) 15 MG immediate release tablet, Take 1 tablet by mouth four times a day, Disp: 120 tablet, Rfl: 0   potassium chloride (KLOR-CON) 10 MEQ tablet, TAKE TWO TABLETS BY MOUTH EVERY MORNING, Disp: 180 tablet, Rfl: 1   promethazine (PHENERGAN) 25 MG tablet, TAKE ONE TABLET BY MOUTH EVERYDAY AT BEDTIME, Disp: 90 tablet, Rfl: 3   tiZANidine (ZANAFLEX) 4 MG tablet, Take 4 mg by mouth every 8 (eight) hours as needed for muscle spasms., Disp: , Rfl:    vitamin B-12 (CYANOCOBALAMIN)  500 MCG tablet, Take 500 mcg by mouth daily. , Disp: , Rfl:    vitamin E 200 UNIT capsule, Take 200 Units by mouth daily., Disp: , Rfl:   Social History   Tobacco Use  Smoking Status Never  Smokeless Tobacco Never    Allergies  Allergen Reactions   Lodine [Etodolac] Anaphylaxis, Hives and Swelling   Oxycodone Anaphylaxis    Other reaction(s): Other (See Comments) UNKNOWN  hives, trouble breathing, tongue swelling (Only Oxycontin) Tolerates plain oxycodone.   Oxycontin [Oxycodone Hcl] Anaphylaxis    hives, trouble breathing, tongue swelling (Only Oxycontin) Tolerates plain oxycodone.   Penicillins Anaphylaxis    Told by a surgeon never to take it again. Has patient had a PCN reaction causing immediate rash, facial/tongue/throat swelling, SOB or lightheadedness with hypotension: Yes Has patient had a PCN reaction causing severe rash involving mucus membranes or skin necrosis: Unknown Has patient had a PCN reaction that required hospitalization: No Has patient had a PCN reaction occurring within the last 10 years: No If all of the above answers are "NO", then may proceed with Cephalosporin use.   Aspirin Other (See Comments)    High-dose caused GI Bleeds   Darvocet [Propoxyphene N-Acetaminophen] Hives   Nitroglycerin Other (See Comments)    IV-BP drops dramatically Can take po   Propoxyphene Hives   Tramadol Hives and Itching   Ultram [Tramadol Hcl] Hives   Valium Other (See Comments)    Circulation problems. "Legs turned black".   Objective:  There were no vitals filed for this visit. There is no height or weight on file to calculate BMI. Constitutional Well developed. Well nourished.  Vascular Dorsalis pedis pulses palpable bilaterally.  Posterior tibial pulses palpable bilaterally. Capillary refill normal to all digits.  No cyanosis or clubbing noted. Pedal hair growth normal.  Neurologic Normal speech. Oriented to person, place, and time. Epicritic sensation to  light touch grossly present bilaterally.  Dermatologic Nails well groomed and normal in appearance. No open wounds. No skin lesions.  Orthopedic: Bilateral plantarflexed fifth metatarsal noted with submetatarsal 5 pain with hyperkeratotic lesion.   Radiographs: 3 views of skeletally mature the bilateral foot:: Midfoot arthritis noted arthritis noted at the first tarsometatarsal joint bilaterally.  Plantarflexed fifth metatarsal negative Assessment:   1. Tailor's bunion of right foot   2. Hammertoes of both feet    Plan:  Patient was evaluated and treated and all questions answered.  Bilateral plantarflexed fifth metatarsal -I explained to patient the etiology of plantarflexed metatarsal versus treatment options were discussed.  Patient is scheduled to get a new ABIs PVRs done.  If they are within normal limits patient will benefit from floating osteotomy of the fifth metatarsal I discussed with patient she states understanding.  No follow-ups on file.

## 2023-05-14 ENCOUNTER — Other Ambulatory Visit: Payer: Self-pay | Admitting: Adult Health

## 2023-05-14 DIAGNOSIS — J45991 Cough variant asthma: Secondary | ICD-10-CM

## 2023-05-14 DIAGNOSIS — J9611 Chronic respiratory failure with hypoxia: Secondary | ICD-10-CM

## 2023-05-15 ENCOUNTER — Ambulatory Visit (INDEPENDENT_AMBULATORY_CARE_PROVIDER_SITE_OTHER): Payer: Medicare Other | Admitting: Podiatry

## 2023-05-15 DIAGNOSIS — M21621 Bunionette of right foot: Secondary | ICD-10-CM

## 2023-05-15 DIAGNOSIS — M2041 Other hammer toe(s) (acquired), right foot: Secondary | ICD-10-CM

## 2023-05-15 DIAGNOSIS — E1151 Type 2 diabetes mellitus with diabetic peripheral angiopathy without gangrene: Secondary | ICD-10-CM

## 2023-05-15 DIAGNOSIS — M2042 Other hammer toe(s) (acquired), left foot: Secondary | ICD-10-CM

## 2023-05-15 NOTE — Progress Notes (Signed)
Patient presents today to measure for diabetic shoes and insoles.  Patient was measured for 1 pair of diabetic shoes and 3 pairs of foam casted diabetic insoles.   Ht  5'6 Wt 217  Shoe size men's 9.5 xw   Shoe style [id number]  Treating physician is dr Gae Dry   Re-appointment for regularly scheduled diabetic foot care visits or if they should experience any trouble with the shoes or insoles.

## 2023-05-17 ENCOUNTER — Ambulatory Visit: Payer: Medicare Other

## 2023-05-20 ENCOUNTER — Telehealth: Payer: Self-pay | Admitting: Urology

## 2023-05-20 ENCOUNTER — Ambulatory Visit (HOSPITAL_COMMUNITY)
Admission: RE | Admit: 2023-05-20 | Discharge: 2023-05-20 | Disposition: A | Discharge status: Home / Self Care | Payer: Medicare Other | Source: Ambulatory Visit | Attending: Podiatry | Admitting: Podiatry

## 2023-05-20 ENCOUNTER — Telehealth (INDEPENDENT_AMBULATORY_CARE_PROVIDER_SITE_OTHER): Payer: Medicare Other | Admitting: Podiatry

## 2023-05-20 DIAGNOSIS — E1151 Type 2 diabetes mellitus with diabetic peripheral angiopathy without gangrene: Secondary | ICD-10-CM | POA: Diagnosis not present

## 2023-05-20 LAB — VAS US ABI WITH/WO TBI
Left ABI: 1.24
Right ABI: 1.25

## 2023-05-20 NOTE — Telephone Encounter (Signed)
Called to release results of ABI testing which were normal. No answer.  LVM and will reattempt to call on tomorrow.

## 2023-05-20 NOTE — Telephone Encounter (Signed)
DOS - 06/17/23  METATARSAL OSTEOTOMY 5TH BILAT --- 16109  [employer] EFFECTIVE DATE - 12/03/22  DEDUCTIBLE - $0.00 OOP - $4,500.00 W/ $4,021.13 REMAINING COINSURANCE - 0%   PER [employer] WEBSITE FOR CPT CODE 60454 Notification or Prior Authorization is not required for the requested services You are not required to submit a notification/prior authorization based on the information provided. If you have general questions about the prior authorization requirements, visit [email or web address] > Clinician Resources > Advance and Admission Notification Requirements. The number above acknowledges your notification. Please write this reference number down for future reference. If you would like to request an organization determination, please call us at [phone].   Decision ID #: [id number]

## 2023-05-24 ENCOUNTER — Telehealth (INDEPENDENT_AMBULATORY_CARE_PROVIDER_SITE_OTHER): Payer: Medicare Other | Admitting: Podiatry

## 2023-05-24 NOTE — Telephone Encounter (Signed)
Informed patient of normal ABI results. She related understanding. Will see Dr. Allena Katz on 06/26/2023.

## 2023-06-03 ENCOUNTER — Other Ambulatory Visit: Payer: Self-pay | Admitting: Family Medicine

## 2023-06-03 DIAGNOSIS — Z79891 Long term (current) use of opiate analgesic: Secondary | ICD-10-CM | POA: Diagnosis not present

## 2023-06-03 DIAGNOSIS — G4700 Insomnia, unspecified: Secondary | ICD-10-CM | POA: Diagnosis not present

## 2023-06-03 DIAGNOSIS — M47816 Spondylosis without myelopathy or radiculopathy, lumbar region: Secondary | ICD-10-CM | POA: Diagnosis not present

## 2023-06-03 DIAGNOSIS — G894 Chronic pain syndrome: Secondary | ICD-10-CM | POA: Diagnosis not present

## 2023-06-17 ENCOUNTER — Other Ambulatory Visit: Payer: Self-pay | Admitting: Podiatry

## 2023-06-17 DIAGNOSIS — M21541 Acquired clubfoot, right foot: Secondary | ICD-10-CM | POA: Diagnosis not present

## 2023-06-17 DIAGNOSIS — M21542 Acquired clubfoot, left foot: Secondary | ICD-10-CM | POA: Diagnosis not present

## 2023-06-17 DIAGNOSIS — M2012 Hallux valgus (acquired), left foot: Secondary | ICD-10-CM | POA: Diagnosis not present

## 2023-06-17 DIAGNOSIS — M205X1 Other deformities of toe(s) (acquired), right foot: Secondary | ICD-10-CM | POA: Diagnosis not present

## 2023-06-17 DIAGNOSIS — M205X2 Other deformities of toe(s) (acquired), left foot: Secondary | ICD-10-CM | POA: Diagnosis not present

## 2023-06-17 DIAGNOSIS — M2011 Hallux valgus (acquired), right foot: Secondary | ICD-10-CM | POA: Diagnosis not present

## 2023-06-17 MED ORDER — HYDROCODONE-ACETAMINOPHEN 5-325 MG PO TABS: 1.0000 | ORAL_TABLET | Freq: Four times a day (QID) | ORAL | 0 refills | Status: DC | PRN

## 2023-06-26 ENCOUNTER — Ambulatory Visit (INDEPENDENT_AMBULATORY_CARE_PROVIDER_SITE_OTHER): Payer: Medicare Other | Admitting: Podiatry

## 2023-06-26 DIAGNOSIS — Z9889 Other specified postprocedural states: Secondary | ICD-10-CM

## 2023-06-26 NOTE — Progress Notes (Signed)
Subjective:  Patient ID: Shelby Anderson, female    DOB: [birth year],  MRN: [id number]  No chief complaint on file.   DOS: 06/17/2023 Procedure: Bilateral floating osteotomy fifth  67 y.o. female returns for post-op check.  Patient states she is doing well.  Minimal complaints.  Minimal pain.  Bandages clean dry and intact ambulating with bilateral surgical shoe  Review of Systems: Negative except as noted in the HPI. Denies N/V/F/Ch.  Past Medical History:  Diagnosis Date   Allergy    Anemia    "chronic"   Angina    Prinzmetal angina   Anxiety and depression    Arthritis    Asthma    Atrial fibrillation (HCC)    h/o "AF w/frequent PVCs"   Breast cancer (HCC)    Left   Cancer (HCC)    hx of skin cancer    CHF (congestive heart failure) (HCC)    Chronic back pain greater than 3 months duration    on chronic narcotics, treated at pain clinic   Chronic respiratory failure (HCC) 09/15/2015   ONO 09/04/15 + desats >begin O2 at 2l/ m    Colon polyps    hyperplastic   Coronary artery disease    Arrythmia, orthostatic hypotension, HLD, HTN; sees Dr. Jacinto Halim   Difficult intubation    "TMJ & woke up when they were still cutting on me"   Dysrhythmia    sees Dr. 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 Jacinto Halim   OSA (obstructive sleep apnea)    Personal history of chemotherapy    Personal  history of radiation therapy    Pneumonia    multiple times   PONV (postoperative nausea and vomiting)    Recurrent upper respiratory infection (URI)    S/P left TKA 09/25/2016   Shortness of breath 11/20/11   "all the time", sees pulmonlogy, ? asthma   Stenotic cervical os    Stomach ulcer    "3 small; found in 05/2011"   TMJ (dislocation of temporomandibular joint)    Tuberculosis    + TB SKIN TEST   Type 2 diabetes mellitus without complication, without long-term current use of insulin (HCC) 12/06/2015   not on meds     Current Outpatient Medications:    albuterol (PROVENTIL) (2.5 MG/3ML) 0.083% nebulizer solution, Take 3 mLs (2.5 mg total) by nebulization every 6 (six) hours as needed for wheezing or shortness of breath (J45.40)., Disp: 75 mL, Rfl: 3   albuterol (VENTOLIN HFA) 108 (90 Base) MCG/ACT inhaler, TAKE 2 PUFFS BY MOUTH EVERY 6 HOURS AS NEEDED FOR WHEEZE OR SHORTNESS OF BREATH, Disp: 20.1 each, Rfl: 3   aspirin EC 81 MG tablet, Take 81 mg at bedtime by  mouth. , Disp: , Rfl:    atorvastatin (LIPITOR) 80 MG tablet, TAKE ONE TABLET BY MOUTH EVERYDAY AT BEDTIME Needs appointment for further refills, Disp: 90 tablet, Rfl: 1   Blood Glucose Monitoring Suppl (ONE TOUCH ULTRA 2) w/Device KIT, Use as directed, Disp: 1 kit, Rfl: 1   budesonide-formoterol (SYMBICORT) 80-4.5 MCG/ACT inhaler, Inhale 2 puffs into the lungs every morning and another 2 puffs 12 hours later., Disp: 3 each, Rfl: 3   buPROPion (WELLBUTRIN XL) 150 MG 24 hr tablet, Take 1 tablet (150 mg total) by mouth daily., Disp: 90 tablet, Rfl: 1   Calcium Carbonate-Vitamin D 600-200 MG-UNIT TABS, Take 1 tablet by mouth daily., Disp: , Rfl:    CINNAMON PO, Take 1,000 mg 2 (two) times daily by mouth., Disp: , Rfl:    dapagliflozin propanediol (FARXIGA) 10 MG TABS tablet, Take 1 tablet (10 mg total) by mouth daily before breakfast., Disp: 90 tablet, Rfl: 3   doxepin (SINEQUAN) 10 MG capsule, Take by mouth., Disp: , Rfl:     famotidine (PEPCID) 20 MG tablet, TAKE ONE TABLET BY MOUTH TWICE DAILY FOR INDIGESTION / heartburn, Disp: 180 tablet, Rfl: 1   fluticasone (FLONASE) 50 MCG/ACT nasal spray, Place into the nose., Disp: , Rfl:    furosemide (LASIX) 20 MG tablet, 1 tablet daily as needed for ankle swelling, Disp: 180 tablet, Rfl: 1   glipiZIDE (GLUCOTROL XL) 5 MG 24 hr tablet, TAKE ONE TABLET BY MOUTH EVERY MORNING Needs appointment for further refills, Disp: 90 tablet, Rfl: 1   Glucosamine 750 MG TABS, Take by mouth., Disp: , Rfl:    glucose blood (ONETOUCH ULTRA) test strip, 1 each by Other route 2 (two) times daily., Disp: 100 strip, Rfl: 3   hydrALAZINE (APRESOLINE) 25 MG tablet, Take 1 tablet (25 mg total) by mouth daily as needed (for BP >140/90 mmHg, up to three times daily)., Disp: 30 tablet, Rfl: 11   HYDROcodone-acetaminophen (NORCO) 5-325 MG tablet, Take 1 tablet by mouth every 6 (six) hours as needed for moderate pain., Disp: 30 tablet, Rfl: 0   hydrocortisone 2.5 % cream, Apply topically as needed., Disp: , Rfl:    isosorbide mononitrate (IMDUR) 60 MG 24 hr tablet, TAKE ONE TABLET BY MOUTH EVERY MORNING Needs appointment for further refills, Disp: 90 tablet, Rfl: 1   L-Methylfolate (DEPLIN) 7.5 MG TABS, Take 7.5 mg by mouth daily with breakfast., Disp: , Rfl:    Lancets (ONETOUCH DELICA PLUS LANCET30G) MISC, USE TO CHECK BLOOD SUGAR 2 TIMES A DAY, Disp: 100 each, Rfl: 1   levothyroxine (SYNTHROID) 75 MCG tablet, Take 1 tablet (75 mcg total) by mouth daily before breakfast., Disp: 90 tablet, Rfl: 3   lisinopril (ZESTRIL) 5 MG tablet, TAKE ONE TABLET BY MOUTH EVERY MORNING, Disp: 90 tablet, Rfl: 1   LYRICA 150 MG capsule, Take 150 mg by mouth 3 (three) times daily. , Disp: , Rfl: 2   metFORMIN (GLUCOPHAGE) 500 MG tablet, TAKE ONE TABLET BY MOUTH TWICE DAILY WITH A MEAL Needs appointment for further refills, Disp: 180 tablet, Rfl: 1   metoprolol succinate (TOPROL-XL) 25 MG 24 hr tablet, Take 1 tablet (25 mg  total) by mouth daily. TAKE WITH OR IMMEDIATELY FOLLOWING A MEAL., Disp: 90 tablet, Rfl: 3   Misc Natural Products (GLUCOS-CHONDROIT-MSM COMPLEX PO), Take 2 tablets by mouth daily., Disp: , Rfl:    mometasone (ELOCON) 0.1 % cream, Apply topically., Disp: , Rfl:    montelukast (SINGULAIR) 10 MG tablet, Take 1 tablet (  10 mg total) by mouth at bedtime., Disp: 90 tablet, Rfl: 3   Multiple Vitamin (MULITIVITAMIN WITH MINERALS) TABS, Take 1 tablet by mouth daily., Disp: , Rfl:    mupirocin cream (BACTROBAN) 2 %, Apply once daily to lesions on feet as needed., Disp: 30 g, Rfl: 1   NEXIUM 40 MG capsule, TAKE 1 CAPSULE BY MOUTH TWICE A DAY, Disp: 180 capsule, Rfl: 0   nitroGLYCERIN (NITROSTAT) 0.4 MG SL tablet, Place 1 tablet (0.4 mg total) under the tongue every 5 (five) minutes as needed for chest pain., Disp: 10 tablet, Rfl: 6   NURTEC 75 MG TBDP, Take 1 each by mouth daily as needed., Disp: 2 tablet, Rfl: 0   ondansetron (ZOFRAN) 4 MG tablet, Take 1 tablet (4 mg total) by mouth every 8 (eight) hours as needed for nausea or vomiting., Disp: 80 tablet, Rfl: 2   ONETOUCH ULTRA test strip, USE TO CHECK BLOOD SUGAR 2 TIMES A DAY, Disp: 100 strip, Rfl: 3   oxyCODONE (ROXICODONE) 15 MG immediate release tablet, Take 1 tablet by mouth four times a day, Disp: 120 tablet, Rfl: 0   potassium chloride (KLOR-CON) 10 MEQ tablet, TAKE TWO TABLETS BY MOUTH EVERY MORNING, Disp: 180 tablet, Rfl: 1   promethazine (PHENERGAN) 25 MG tablet, TAKE ONE TABLET BY MOUTH EVERYDAY AT BEDTIME, Disp: 90 tablet, Rfl: 3   tiZANidine (ZANAFLEX) 4 MG tablet, Take 4 mg by mouth every 8 (eight) hours as needed for muscle spasms., Disp: , Rfl:    vitamin B-12 (CYANOCOBALAMIN) 500 MCG tablet, Take 500 mcg by mouth daily. , Disp: , Rfl:    vitamin E 200 UNIT capsule, Take 200 Units by mouth daily., Disp: , Rfl:   Social History   Tobacco Use  Smoking Status Never  Smokeless Tobacco Never    Allergies  Allergen Reactions   Lodine  [Etodolac] Anaphylaxis, Hives and Swelling   Oxycodone Anaphylaxis    Other reaction(s): Other (See Comments) UNKNOWN  hives, trouble breathing, tongue swelling (Only Oxycontin) Tolerates plain oxycodone.   Oxycontin [Oxycodone Hcl] Anaphylaxis    hives, trouble breathing, tongue swelling (Only Oxycontin) Tolerates plain oxycodone.   Penicillins Anaphylaxis    Told by a surgeon never to take it again. Has patient had a PCN reaction causing immediate rash, facial/tongue/throat swelling, SOB or lightheadedness with hypotension: Yes Has patient had a PCN reaction causing severe rash involving mucus membranes or skin necrosis: Unknown Has patient had a PCN reaction that required hospitalization: No Has patient had a PCN reaction occurring within the last 10 years: No If all of the above answers are "NO", then may proceed with Cephalosporin use.   Aspirin Other (See Comments)    High-dose caused GI Bleeds   Darvocet [Propoxyphene N-Acetaminophen] Hives   Nitroglycerin Other (See Comments)    IV-BP drops dramatically Can take po   Propoxyphene Hives   Tramadol Hives and Itching   Ultram [Tramadol Hcl] Hives   Valium Other (See Comments)    Circulation problems. "Legs turned black".   Objective:  There were no vitals filed for this visit. There is no height or weight on file to calculate BMI. Constitutional Well developed. Well nourished.  Vascular Foot warm and well perfused. Capillary refill normal to all digits.   Neurologic Normal speech. Oriented to person, place, and time. Epicritic sensation to light touch grossly present bilaterally.  Dermatologic Skin healing well without signs of infection. Skin edges well coapted without signs of infection.  Orthopedic: Tenderness to palpation  noted about the surgical site.   Radiographs: None Assessment:   1. Status post foot surgery    Plan:  Patient was evaluated and treated and all questions answered.  S/p foot surgery  bilaterally -Progressing as expected post-operatively. -XR: None -WB Status: Weightbearing as tolerated in surgical shoe -Sutures: Intact.  No current signs of Deis is noted no complication noted. -Medications: None -Foot redressed.  No follow-ups on file.

## 2023-07-02 DIAGNOSIS — G4700 Insomnia, unspecified: Secondary | ICD-10-CM | POA: Diagnosis not present

## 2023-07-02 DIAGNOSIS — G894 Chronic pain syndrome: Secondary | ICD-10-CM | POA: Diagnosis not present

## 2023-07-02 DIAGNOSIS — Z79891 Long term (current) use of opiate analgesic: Secondary | ICD-10-CM | POA: Diagnosis not present

## 2023-07-02 DIAGNOSIS — M47816 Spondylosis without myelopathy or radiculopathy, lumbar region: Secondary | ICD-10-CM | POA: Diagnosis not present

## 2023-07-05 ENCOUNTER — Other Ambulatory Visit: Payer: Self-pay | Admitting: Family Medicine

## 2023-07-05 ENCOUNTER — Other Ambulatory Visit: Payer: Self-pay | Admitting: Adult Health

## 2023-07-10 ENCOUNTER — Telehealth: Payer: Self-pay | Admitting: Family Medicine

## 2023-07-10 ENCOUNTER — Ambulatory Visit (INDEPENDENT_AMBULATORY_CARE_PROVIDER_SITE_OTHER): Payer: Medicare Other | Admitting: Podiatry

## 2023-07-10 DIAGNOSIS — E1151 Type 2 diabetes mellitus with diabetic peripheral angiopathy without gangrene: Secondary | ICD-10-CM

## 2023-07-10 DIAGNOSIS — Z9889 Other specified postprocedural states: Secondary | ICD-10-CM

## 2023-07-10 NOTE — Progress Notes (Signed)
Subjective:  Patient ID: Shelby Anderson, female    DOB: 1956-07-26,  MRN: [id number]  Chief Complaint  Patient presents with   Routine Post Op    DOS: 06/17/2023 Procedure: Bilateral floating osteotomy fifth  68 y.o. female returns for post-op check.  Patient states she is doing well.  Minimal complaints.  Minimal pain.  No bandages ambulating and bilateral surgical shoe  Review of Systems: Negative except as noted in the HPI. Denies N/V/F/Ch.  Past Medical History:  Diagnosis Date   Allergy    Anemia    "chronic"   Angina    Prinzmetal angina   Anxiety and depression    Arthritis    Asthma    Atrial fibrillation (HCC)    h/o "AF w/frequent PVCs"   Breast cancer (HCC)    Left   Cancer (HCC)    hx of skin cancer    CHF (congestive heart failure) (HCC)    Chronic back pain greater than 3 months duration    on chronic narcotics, treated at pain clinic   Chronic respiratory failure (HCC) 09/15/2015   ONO 09/04/15 + desats >begin O2 at 2l/ m    Colon polyps    hyperplastic   Coronary artery disease    Arrythmia, orthostatic hypotension, HLD, HTN; sees Dr. Jacinto Halim   Difficult intubation    "TMJ & woke up when they were still cutting on me"   Dysrhythmia    sees Dr. Jacinto Halim and a cardiologist at [facility]   Esophageal stricture    Family history of melanoma ]   Family history of pancreatic cancer    Fatty liver    Fatty liver    Fibroids    Fibromyalgia    "in my legs"   GERD (gastroesophageal reflux disease)    hx hiatal hernia, stricture and gastric ulcer   Headache(784.0)    migraines   Heart murmur    Hiatal hernia    History of loop recorder    History of migraines    "dx'd when I was in my teens"   Hyperlipemia    Hypertension    Mental disorder    Mild episode of recurrent major depressive disorder (HCC) 12/06/2015   Myocardial infarction [facility]) 1980's & 1990;   sees Dr. 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 asthma   Stenotic cervical os    Stomach ulcer    "3 small; found in 05/2011"   TMJ (dislocation of temporomandibular joint)    Tuberculosis    + TB SKIN TEST   Type 2 diabetes mellitus without complication, without long-term current use of insulin (HCC) 12/06/2015   not on meds     Current Outpatient Medications:    albuterol (PROVENTIL) (2.5 MG/3ML) 0.083% nebulizer solution, Take 3 mLs (2.5 mg total) by nebulization every 6 (six) hours as needed for wheezing or shortness of breath (J45.40)., Disp: 75 mL, Rfl: 3   albuterol (VENTOLIN HFA) 108 (90 Base) MCG/ACT inhaler, TAKE 2 PUFFS BY MOUTH EVERY 6 HOURS AS NEEDED FOR WHEEZE OR SHORTNESS OF BREATH, Disp: 20.1 each, Rfl: 3   aspirin EC 81 MG tablet, Take 81  mg at bedtime by mouth. , Disp: , Rfl:    atorvastatin (LIPITOR) 80 MG tablet, TAKE ONE TABLET BY MOUTH EVERYDAY AT BEDTIME Needs appointment for further refills, Disp: 90 tablet, Rfl: 1   Blood Glucose Monitoring Suppl (ONE TOUCH ULTRA 2) w/Device KIT, Use as directed, Disp: 1 kit, Rfl: 1   budesonide-formoterol (SYMBICORT) 80-4.5 MCG/ACT inhaler, Inhale 2 puffs into the lungs every morning and another 2 puffs 12 hours later., Disp: 3 each, Rfl: 3   buPROPion (WELLBUTRIN XL) 150 MG 24 hr tablet, Take 1 tablet (150 mg total) by mouth daily., Disp: 90 tablet, Rfl: 1   Calcium Carbonate-Vitamin D 600-200 MG-UNIT TABS, Take 1 tablet by mouth daily., Disp: , Rfl:    CINNAMON PO, Take 1,000 mg 2 (two) times daily by mouth., Disp: , Rfl:    dapagliflozin propanediol (FARXIGA) 10 MG TABS tablet, Take 1 tablet (10 mg total) by mouth daily before breakfast., Disp: 90 tablet, Rfl: 3   doxepin (SINEQUAN) 10 MG capsule, Take by mouth., Disp: , Rfl:     famotidine (PEPCID) 20 MG tablet, TAKE ONE TABLET BY MOUTH TWICE DAILY FOR INDIGESTION / heartburn, Disp: 180 tablet, Rfl: 1   fluticasone (FLONASE) 50 MCG/ACT nasal spray, Place into the nose., Disp: , Rfl:    furosemide (LASIX) 20 MG tablet, 1 tablet daily as needed for ankle swelling, Disp: 180 tablet, Rfl: 1   glipiZIDE (GLUCOTROL XL) 5 MG 24 hr tablet, TAKE ONE TABLET BY MOUTH EVERY MORNING Needs appointment for further refills, Disp: 90 tablet, Rfl: 1   Glucosamine 750 MG TABS, Take by mouth., Disp: , Rfl:    glucose blood (ONETOUCH ULTRA) test strip, 1 each by Other route 2 (two) times daily., Disp: 100 strip, Rfl: 3   hydrALAZINE (APRESOLINE) 25 MG tablet, Take 1 tablet (25 mg total) by mouth daily as needed (for BP >140/90 mmHg, up to three times daily)., Disp: 30 tablet, Rfl: 11   HYDROcodone-acetaminophen (NORCO) 5-325 MG tablet, Take 1 tablet by mouth every 6 (six) hours as needed for moderate pain., Disp: 30 tablet, Rfl: 0   hydrocortisone 2.5 % cream, Apply topically as needed., Disp: , Rfl:    isosorbide mononitrate (IMDUR) 60 MG 24 hr tablet, TAKE ONE TABLET BY MOUTH EVERY MORNING Needs appointment for further refills, Disp: 90 tablet, Rfl: 1   L-Methylfolate (DEPLIN) 7.5 MG TABS, Take 7.5 mg by mouth daily with breakfast., Disp: , Rfl:    Lancets (ONETOUCH DELICA PLUS LANCET30G) MISC, USE TO CHECK BLOOD SUGAR 2 TIMES A DAY, Disp: 100 each, Rfl: 1   levothyroxine (SYNTHROID) 75 MCG tablet, Take 1 tablet (75 mcg total) by mouth daily before breakfast., Disp: 90 tablet, Rfl: 3   lisinopril (ZESTRIL) 5 MG tablet, TAKE ONE TABLET BY MOUTH EVERY MORNING, Disp: 90 tablet, Rfl: 1   LYRICA 150 MG capsule, Take 150 mg by mouth 3 (three) times daily. , Disp: , Rfl: 2   metFORMIN (GLUCOPHAGE) 500 MG tablet, TAKE ONE TABLET BY MOUTH TWICE DAILY WITH A MEAL Needs appointment for further refills, Disp: 180 tablet, Rfl: 1   metoprolol succinate (TOPROL-XL) 25 MG 24 hr tablet, Take 1 tablet (25  mg total) by mouth daily. TAKE WITH OR IMMEDIATELY FOLLOWING A MEAL., Disp: 90 tablet, Rfl: 3   Misc Natural Products (GLUCOS-CHONDROIT-MSM COMPLEX PO), Take 2 tablets by mouth daily., Disp: , Rfl:    mometasone (ELOCON) 0.1 % cream, Apply topically., Disp: , Rfl:    montelukast (SINGULAIR) 10 MG  tablet, TAKE ONE TABLET BY MOUTH EVERYDAY AT BEDTIME, Disp: 90 tablet, Rfl: 0   Multiple Vitamin (MULITIVITAMIN WITH MINERALS) TABS, Take 1 tablet by mouth daily., Disp: , Rfl:    mupirocin cream (BACTROBAN) 2 %, Apply once daily to lesions on feet as needed., Disp: 30 g, Rfl: 1   NEXIUM 40 MG capsule, TAKE 1 CAPSULE BY MOUTH TWICE A DAY, Disp: 180 capsule, Rfl: 0   nitroGLYCERIN (NITROSTAT) 0.4 MG SL tablet, Place 1 tablet (0.4 mg total) under the tongue every 5 (five) minutes as needed for chest pain., Disp: 10 tablet, Rfl: 6   NURTEC 75 MG TBDP, Take 1 each by mouth daily as needed., Disp: 2 tablet, Rfl: 0   ondansetron (ZOFRAN) 4 MG tablet, Take 1 tablet (4 mg total) by mouth every 8 (eight) hours as needed for nausea or vomiting., Disp: 80 tablet, Rfl: 2   ONETOUCH ULTRA test strip, USE TO CHECK BLOOD SUGAR 2 TIMES A DAY, Disp: 100 strip, Rfl: 3   oxyCODONE (ROXICODONE) 15 MG immediate release tablet, Take 1 tablet by mouth four times a day, Disp: 120 tablet, Rfl: 0   potassium chloride (KLOR-CON) 10 MEQ tablet, TAKE TWO TABLETS BY MOUTH EVERY MORNING, Disp: 180 tablet, Rfl: 1   promethazine (PHENERGAN) 25 MG tablet, TAKE ONE TABLET BY MOUTH EVERYDAY AT BEDTIME, Disp: 90 tablet, Rfl: 3   tiZANidine (ZANAFLEX) 4 MG tablet, Take 4 mg by mouth every 8 (eight) hours as needed for muscle spasms., Disp: , Rfl:    vitamin B-12 (CYANOCOBALAMIN) 500 MCG tablet, Take 500 mcg by mouth daily. , Disp: , Rfl:    vitamin E 200 UNIT capsule, Take 200 Units by mouth daily., Disp: , Rfl:   Social History   Tobacco Use  Smoking Status Never  Smokeless Tobacco Never    Allergies  Allergen Reactions   Lodine  [Etodolac] Anaphylaxis, Hives and Swelling   Oxycodone Anaphylaxis    Other reaction(s): Other (See Comments) UNKNOWN  hives, trouble breathing, tongue swelling (Only Oxycontin) Tolerates plain oxycodone.   Oxycontin [Oxycodone Hcl] Anaphylaxis    hives, trouble breathing, tongue swelling (Only Oxycontin) Tolerates plain oxycodone.   Penicillins Anaphylaxis    Told by a surgeon never to take it again. Has patient had a PCN reaction causing immediate rash, facial/tongue/throat swelling, SOB or lightheadedness with hypotension: Yes Has patient had a PCN reaction causing severe rash involving mucus membranes or skin necrosis: Unknown Has patient had a PCN reaction that required hospitalization: No Has patient had a PCN reaction occurring within the last 10 years: No If all of the above answers are "NO", then may proceed with Cephalosporin use.   Aspirin Other (See Comments)    High-dose caused GI Bleeds   Darvocet [Propoxyphene N-Acetaminophen] Hives   Nitroglycerin Other (See Comments)    IV-BP drops dramatically Can take po   Propoxyphene Hives   Tramadol Hives and Itching   Ultram [Tramadol Hcl] Hives   Valium Other (See Comments)    Circulation problems. "Legs turned black".   Objective:  There were no vitals filed for this visit. There is no height or weight on file to calculate BMI. Constitutional Well developed. Well nourished.  Vascular Foot warm and well perfused. Capillary refill normal to all digits.   Neurologic Normal speech. Oriented to person, place, and time. Epicritic sensation to light touch grossly present bilaterally.  Dermatologic Skin completely epithelialized.  No signs of dehiscence noted no complication noted.  Orthopedic: No further tenderness to  palpation noted about the surgical site.   Radiographs: None Assessment:   No diagnosis found.  Plan:  Patient was evaluated and treated and all questions answered.  S/p foot surgery  bilaterally -Clinically healed and officially discharged from my care if any foot and ankle issues advised her to come back and see me.  Stitches were removed reduction of deformity and pressure noted to submetatarsal 5 bilaterally  No follow-ups on file.

## 2023-07-10 NOTE — Telephone Encounter (Signed)
Patient dropped off document  DME Compliance Documentation Pak , to be filled out by provider. Patient requested to send it back via Call Patient to pick up within ASAP. Document is located in providers tray at front office.Please advise at Mobile [phone] (mobile)

## 2023-07-11 NOTE — Telephone Encounter (Signed)
PCP completed the forms at no charge and this was faxed to [facility] at [phone].  Sent to be scanned also.

## 2023-07-18 ENCOUNTER — Encounter (INDEPENDENT_AMBULATORY_CARE_PROVIDER_SITE_OTHER): Payer: Self-pay

## 2023-07-18 ENCOUNTER — Other Ambulatory Visit: Payer: Self-pay

## 2023-07-18 DIAGNOSIS — E119 Type 2 diabetes mellitus without complications: Secondary | ICD-10-CM

## 2023-07-18 MED ORDER — DAPAGLIFLOZIN PROPANEDIOL 10 MG PO TABS
10.0000 mg | ORAL_TABLET | Freq: Every day | ORAL | 1 refills | Status: DC
Start: 2023-07-18 — End: 2024-05-25

## 2023-07-18 NOTE — Progress Notes (Signed)
   07/18/2023  Patient ID: Shelby Anderson, female   DOB: 12-30-1955, 67 y.o.   MRN: [id number]  Needing a refill on Farxiga from [facility] She has a few tablets remaining She called to request the refill from [facility] and was told a new prescription was needed- pending for Dr. Casimiro Needle to sign

## 2023-07-23 ENCOUNTER — Other Ambulatory Visit: Payer: Self-pay | Admitting: Internal Medicine

## 2023-07-30 DIAGNOSIS — M47816 Spondylosis without myelopathy or radiculopathy, lumbar region: Secondary | ICD-10-CM | POA: Diagnosis not present

## 2023-07-30 DIAGNOSIS — G4700 Insomnia, unspecified: Secondary | ICD-10-CM | POA: Diagnosis not present

## 2023-07-30 DIAGNOSIS — G894 Chronic pain syndrome: Secondary | ICD-10-CM | POA: Diagnosis not present

## 2023-07-30 DIAGNOSIS — Z79891 Long term (current) use of opiate analgesic: Secondary | ICD-10-CM | POA: Diagnosis not present

## 2023-07-31 ENCOUNTER — Emergency Department (HOSPITAL_COMMUNITY): Payer: Medicare Other

## 2023-07-31 ENCOUNTER — Inpatient Hospital Stay (HOSPITAL_COMMUNITY)
Admission: EM | Admit: 2023-07-31 | Discharge: 2023-08-03 | DRG: 690 | Disposition: A | Discharge status: Home / Self Care | Payer: Medicare Other | Attending: Internal Medicine | Admitting: Internal Medicine

## 2023-07-31 ENCOUNTER — Ambulatory Visit: Payer: Medicare Other

## 2023-07-31 DIAGNOSIS — Z885 Allergy status to narcotic agent status: Secondary | ICD-10-CM

## 2023-07-31 DIAGNOSIS — I10 Essential (primary) hypertension: Secondary | ICD-10-CM | POA: Diagnosis present

## 2023-07-31 DIAGNOSIS — I700 Atherosclerosis of aorta: Secondary | ICD-10-CM | POA: Diagnosis not present

## 2023-07-31 DIAGNOSIS — G4733 Obstructive sleep apnea (adult) (pediatric): Secondary | ICD-10-CM | POA: Diagnosis not present

## 2023-07-31 DIAGNOSIS — F32A Depression, unspecified: Secondary | ICD-10-CM | POA: Diagnosis present

## 2023-07-31 DIAGNOSIS — E119 Type 2 diabetes mellitus without complications: Secondary | ICD-10-CM | POA: Diagnosis not present

## 2023-07-31 DIAGNOSIS — M797 Fibromyalgia: Secondary | ICD-10-CM | POA: Diagnosis not present

## 2023-07-31 DIAGNOSIS — R112 Nausea with vomiting, unspecified: Secondary | ICD-10-CM | POA: Diagnosis not present

## 2023-07-31 DIAGNOSIS — E039 Hypothyroidism, unspecified: Secondary | ICD-10-CM | POA: Diagnosis not present

## 2023-07-31 DIAGNOSIS — K449 Diaphragmatic hernia without obstruction or gangrene: Secondary | ICD-10-CM | POA: Diagnosis present

## 2023-07-31 DIAGNOSIS — Z85828 Personal history of other malignant neoplasm of skin: Secondary | ICD-10-CM | POA: Diagnosis not present

## 2023-07-31 DIAGNOSIS — I4891 Unspecified atrial fibrillation: Secondary | ICD-10-CM | POA: Diagnosis not present

## 2023-07-31 DIAGNOSIS — R1013 Epigastric pain: Secondary | ICD-10-CM | POA: Diagnosis not present

## 2023-07-31 DIAGNOSIS — Z8711 Personal history of peptic ulcer disease: Secondary | ICD-10-CM

## 2023-07-31 DIAGNOSIS — Z808 Family history of malignant neoplasm of other organs or systems: Secondary | ICD-10-CM

## 2023-07-31 DIAGNOSIS — G43909 Migraine, unspecified, not intractable, without status migrainosus: Secondary | ICD-10-CM | POA: Diagnosis not present

## 2023-07-31 DIAGNOSIS — Z807 Family history of other malignant neoplasms of lymphoid, hematopoietic and related tissues: Secondary | ICD-10-CM

## 2023-07-31 DIAGNOSIS — Z79899 Other long term (current) drug therapy: Secondary | ICD-10-CM

## 2023-07-31 DIAGNOSIS — Z800 Family history of malignant neoplasm of digestive organs: Secondary | ICD-10-CM

## 2023-07-31 DIAGNOSIS — N3000 Acute cystitis without hematuria: Secondary | ICD-10-CM

## 2023-07-31 DIAGNOSIS — Z79891 Long term (current) use of opiate analgesic: Secondary | ICD-10-CM

## 2023-07-31 DIAGNOSIS — R1084 Generalized abdominal pain: Secondary | ICD-10-CM | POA: Diagnosis not present

## 2023-07-31 DIAGNOSIS — Z79890 Hormone replacement therapy (postmenopausal): Secondary | ICD-10-CM

## 2023-07-31 DIAGNOSIS — K219 Gastro-esophageal reflux disease without esophagitis: Secondary | ICD-10-CM | POA: Diagnosis present

## 2023-07-31 DIAGNOSIS — Z923 Personal history of irradiation: Secondary | ICD-10-CM

## 2023-07-31 DIAGNOSIS — I252 Old myocardial infarction: Secondary | ICD-10-CM | POA: Diagnosis not present

## 2023-07-31 DIAGNOSIS — M1990 Unspecified osteoarthritis, unspecified site: Secondary | ICD-10-CM | POA: Diagnosis present

## 2023-07-31 DIAGNOSIS — E785 Hyperlipidemia, unspecified: Secondary | ICD-10-CM | POA: Diagnosis present

## 2023-07-31 DIAGNOSIS — J45909 Unspecified asthma, uncomplicated: Secondary | ICD-10-CM | POA: Diagnosis not present

## 2023-07-31 DIAGNOSIS — Z833 Family history of diabetes mellitus: Secondary | ICD-10-CM

## 2023-07-31 DIAGNOSIS — J9610 Chronic respiratory failure, unspecified whether with hypoxia or hypercapnia: Secondary | ICD-10-CM | POA: Diagnosis present

## 2023-07-31 DIAGNOSIS — Z9981 Dependence on supplemental oxygen: Secondary | ICD-10-CM | POA: Diagnosis not present

## 2023-07-31 DIAGNOSIS — E1169 Type 2 diabetes mellitus with other specified complication: Secondary | ICD-10-CM | POA: Diagnosis present

## 2023-07-31 DIAGNOSIS — Z96652 Presence of left artificial knee joint: Secondary | ICD-10-CM | POA: Diagnosis present

## 2023-07-31 DIAGNOSIS — M549 Dorsalgia, unspecified: Secondary | ICD-10-CM | POA: Diagnosis present

## 2023-07-31 DIAGNOSIS — Z9221 Personal history of antineoplastic chemotherapy: Secondary | ICD-10-CM

## 2023-07-31 DIAGNOSIS — Z8249 Family history of ischemic heart disease and other diseases of the circulatory system: Secondary | ICD-10-CM

## 2023-07-31 DIAGNOSIS — R61 Generalized hyperhidrosis: Secondary | ICD-10-CM | POA: Diagnosis present

## 2023-07-31 DIAGNOSIS — Z7951 Long term (current) use of inhaled steroids: Secondary | ICD-10-CM

## 2023-07-31 DIAGNOSIS — K760 Fatty (change of) liver, not elsewhere classified: Secondary | ICD-10-CM | POA: Diagnosis present

## 2023-07-31 DIAGNOSIS — Z853 Personal history of malignant neoplasm of breast: Secondary | ICD-10-CM

## 2023-07-31 DIAGNOSIS — N390 Urinary tract infection, site not specified: Secondary | ICD-10-CM | POA: Diagnosis not present

## 2023-07-31 DIAGNOSIS — G8929 Other chronic pain: Secondary | ICD-10-CM | POA: Diagnosis present

## 2023-07-31 DIAGNOSIS — R0600 Dyspnea, unspecified: Secondary | ICD-10-CM | POA: Diagnosis not present

## 2023-07-31 DIAGNOSIS — Z86010 Personal history of colonic polyps: Secondary | ICD-10-CM

## 2023-07-31 DIAGNOSIS — Z880 Allergy status to penicillin: Secondary | ICD-10-CM

## 2023-07-31 DIAGNOSIS — I2510 Atherosclerotic heart disease of native coronary artery without angina pectoris: Secondary | ICD-10-CM | POA: Diagnosis present

## 2023-07-31 DIAGNOSIS — Z7982 Long term (current) use of aspirin: Secondary | ICD-10-CM

## 2023-07-31 DIAGNOSIS — F419 Anxiety disorder, unspecified: Secondary | ICD-10-CM | POA: Diagnosis present

## 2023-07-31 DIAGNOSIS — Z888 Allergy status to other drugs, medicaments and biological substances status: Secondary | ICD-10-CM

## 2023-07-31 DIAGNOSIS — Z886 Allergy status to analgesic agent status: Secondary | ICD-10-CM

## 2023-07-31 DIAGNOSIS — Z7984 Long term (current) use of oral hypoglycemic drugs: Secondary | ICD-10-CM

## 2023-07-31 DIAGNOSIS — K30 Functional dyspepsia: Secondary | ICD-10-CM | POA: Diagnosis present

## 2023-07-31 LAB — URINALYSIS, ROUTINE W REFLEX MICROSCOPIC
Bilirubin Urine: NEGATIVE
Glucose, UA: >=500 mg/dL — AB
Hgb urine dipstick: NEGATIVE
Ketones, ur: 80 mg/dL — AB
Nitrite: NEGATIVE
Protein, ur: 30 mg/dL — AB
Specific Gravity, Urine: 1.035 — ABNORMAL HIGH (ref 1.005–1.030)
WBC, UA: >50 WBC/hpf (ref 0–5)
pH: 5 (ref 5.0–8.0)

## 2023-07-31 LAB — BRAIN NATRIURETIC PEPTIDE: B Natriuretic Peptide: 100.8 pg/mL — ABNORMAL HIGH (ref 0.0–100.0)

## 2023-07-31 LAB — CBG MONITORING, ED
Glucose-Capillary: 137 mg/dL — ABNORMAL HIGH (ref 70–99)
Glucose-Capillary: 124 mg/dL — ABNORMAL HIGH (ref 70–99)

## 2023-07-31 LAB — COMPREHENSIVE METABOLIC PANEL
ALT: 27 U/L (ref 0–44)
AST: 26 U/L (ref 15–41)
Albumin: 3.5 g/dL (ref 3.5–5.0)
Alkaline Phosphatase: 70 U/L (ref 38–126)
Anion gap: 15 (ref 5–15)
BUN: 18 mg/dL (ref 8–23)
CO2: 24 mmol/L (ref 22–32)
Calcium: 9.5 mg/dL (ref 8.9–10.3)
Chloride: 102 mmol/L (ref 98–111)
Creatinine, Ser: 0.96 mg/dL (ref 0.44–1.00)
GFR, Estimated: >60 mL/min (ref >60)
Glucose, Bld: 170 mg/dL — ABNORMAL HIGH (ref 70–99)
Potassium: 3.5 mmol/L (ref 3.5–5.1)
Sodium: 141 mmol/L (ref 135–145)
Total Bilirubin: 0.7 mg/dL (ref 0.3–1.2)
Total Protein: 7.7 g/dL (ref 6.5–8.1)

## 2023-07-31 LAB — LIPASE, BLOOD: Lipase: 25 U/L (ref 11–51)

## 2023-07-31 LAB — CBC
HCT: 43.2 % (ref 36.0–46.0)
Hemoglobin: 13.9 g/dL (ref 12.0–15.0)
MCH: 28.1 pg (ref 26.0–34.0)
MCHC: 32.2 g/dL (ref 30.0–36.0)
MCV: 87.4 fL (ref 80.0–100.0)
Platelets: 246 10*3/uL (ref 150–400)
RBC: 4.94 MIL/uL (ref 3.87–5.11)
RDW: 15.5 % (ref 11.5–15.5)
WBC: 8.8 10*3/uL (ref 4.0–10.5)
nRBC: 0 % (ref 0.0–0.2)

## 2023-07-31 LAB — TROPONIN I (HIGH SENSITIVITY)
Troponin I (High Sensitivity): 8 ng/L (ref <18)
Troponin I (High Sensitivity): 9 ng/L (ref <18)

## 2023-07-31 MED ORDER — ACETAMINOPHEN 650 MG RE SUPP: 650.0000 mg | Freq: Four times a day (QID) | RECTAL | Status: DC | PRN

## 2023-07-31 MED ORDER — SODIUM CHLORIDE 0.9 % IV SOLN
2.0000 g | INTRAVENOUS | Status: DC
  Administered 2023-08-01 – 2023-08-03 (×3): 2 g via INTRAVENOUS
  Filled 2023-07-31 (×3): qty 20

## 2023-07-31 MED ORDER — ONDANSETRON HCL 4 MG/2ML IJ SOLN
4.0000 mg | Freq: Once | INTRAMUSCULAR | Status: AC
  Administered 2023-07-31: 4 mg via INTRAVENOUS
  Filled 2023-07-31: qty 2

## 2023-07-31 MED ORDER — DIPHENHYDRAMINE HCL 50 MG/ML IJ SOLN
25.0000 mg | Freq: Once | INTRAMUSCULAR | Status: AC
  Administered 2023-07-31: 25 mg via INTRAVENOUS
  Filled 2023-07-31: qty 1

## 2023-07-31 MED ORDER — ONDANSETRON 4 MG PO TBDP
4.0000 mg | ORAL_TABLET | Freq: Once | ORAL | Status: AC | PRN
  Administered 2023-07-31: 4 mg via ORAL
  Filled 2023-07-31: qty 1

## 2023-07-31 MED ORDER — ACETAMINOPHEN 325 MG PO TABS: 650.0000 mg | ORAL_TABLET | Freq: Four times a day (QID) | ORAL | Status: DC | PRN

## 2023-07-31 MED ORDER — ENOXAPARIN SODIUM 40 MG/0.4ML IJ SOSY
40.0000 mg | PREFILLED_SYRINGE | INTRAMUSCULAR | Status: DC
  Administered 2023-07-31 – 2023-08-02 (×3): 40 mg via SUBCUTANEOUS
  Filled 2023-07-31 (×3): qty 0.4

## 2023-07-31 MED ORDER — PROMETHAZINE HCL 25 MG PO TABS
25.0000 mg | ORAL_TABLET | Freq: Once | ORAL | Status: AC
  Administered 2023-07-31: 25 mg via ORAL
  Filled 2023-07-31: qty 1

## 2023-07-31 MED ORDER — ALBUTEROL SULFATE (2.5 MG/3ML) 0.083% IN NEBU
2.5000 mg | INHALATION_SOLUTION | Freq: Four times a day (QID) | RESPIRATORY_TRACT | Status: DC | PRN
  Administered 2023-08-02 (×2): 2.5 mg via RESPIRATORY_TRACT
  Filled 2023-07-31 (×2): qty 3

## 2023-07-31 MED ORDER — DOXEPIN HCL 10 MG PO CAPS
10.0000 mg | ORAL_CAPSULE | Freq: Every day | ORAL | Status: DC
  Administered 2023-08-01 – 2023-08-02 (×2): 10 mg via ORAL
  Filled 2023-07-31 (×3): qty 1

## 2023-07-31 MED ORDER — MOMETASONE FURO-FORMOTEROL FUM 100-5 MCG/ACT IN AERO
2.0000 | INHALATION_SPRAY | Freq: Two times a day (BID) | RESPIRATORY_TRACT | Status: DC
  Administered 2023-08-02 – 2023-08-03 (×4): 2 via RESPIRATORY_TRACT
  Filled 2023-07-31: qty 8.8

## 2023-07-31 MED ORDER — FENTANYL CITRATE PF 50 MCG/ML IJ SOSY: 50.0000 ug | PREFILLED_SYRINGE | Freq: Once | INTRAMUSCULAR | Status: DC

## 2023-07-31 MED ORDER — LISINOPRIL 10 MG PO TABS
5.0000 mg | ORAL_TABLET | Freq: Every morning | ORAL | Status: DC
  Administered 2023-08-01 – 2023-08-03 (×3): 5 mg via ORAL
  Filled 2023-07-31 (×3): qty 1

## 2023-07-31 MED ORDER — OXYCODONE HCL 5 MG PO TABS
15.0000 mg | ORAL_TABLET | Freq: Four times a day (QID) | ORAL | Status: DC | PRN
  Administered 2023-08-01 – 2023-08-03 (×5): 15 mg via ORAL
  Filled 2023-07-31 (×5): qty 3

## 2023-07-31 MED ORDER — SODIUM CHLORIDE 0.9 % IV SOLN
2.0000 g | Freq: Once | INTRAVENOUS | Status: AC
  Administered 2023-07-31: 2 g via INTRAVENOUS
  Filled 2023-07-31: qty 20

## 2023-07-31 MED ORDER — MONTELUKAST SODIUM 10 MG PO TABS
10.0000 mg | ORAL_TABLET | Freq: Every day | ORAL | Status: DC
  Administered 2023-08-01 – 2023-08-02 (×2): 10 mg via ORAL
  Filled 2023-07-31 (×2): qty 1

## 2023-07-31 MED ORDER — INSULIN ASPART 100 UNIT/ML IJ SOLN: 0.0000 [IU] | Freq: Every day | INTRAMUSCULAR | Status: DC

## 2023-07-31 MED ORDER — ATORVASTATIN CALCIUM 80 MG PO TABS
80.0000 mg | ORAL_TABLET | Freq: Every day | ORAL | Status: DC
  Administered 2023-08-01 – 2023-08-02 (×2): 80 mg via ORAL
  Filled 2023-07-31 (×2): qty 1

## 2023-07-31 MED ORDER — ASPIRIN 81 MG PO TBEC
81.0000 mg | DELAYED_RELEASE_TABLET | Freq: Every day | ORAL | Status: DC
  Administered 2023-08-01 – 2023-08-02 (×2): 81 mg via ORAL
  Filled 2023-07-31 (×2): qty 1

## 2023-07-31 MED ORDER — LACTATED RINGERS IV BOLUS
1000.0000 mL | Freq: Once | INTRAVENOUS | Status: AC
  Administered 2023-07-31: 1000 mL via INTRAVENOUS

## 2023-07-31 MED ORDER — SENNOSIDES-DOCUSATE SODIUM 8.6-50 MG PO TABS: 1.0000 | ORAL_TABLET | Freq: Every evening | ORAL | Status: DC | PRN

## 2023-07-31 MED ORDER — LACTATED RINGERS IV SOLN: INTRAVENOUS | Status: AC

## 2023-07-31 MED ORDER — BUPROPION HCL ER (XL) 150 MG PO TB24
150.0000 mg | ORAL_TABLET | Freq: Every day | ORAL | Status: DC
  Administered 2023-08-01 – 2023-08-03 (×3): 150 mg via ORAL
  Filled 2023-07-31 (×3): qty 1

## 2023-07-31 MED ORDER — INSULIN ASPART 100 UNIT/ML IJ SOLN
0.0000 [IU] | Freq: Three times a day (TID) | INTRAMUSCULAR | Status: DC
  Administered 2023-08-02 – 2023-08-03 (×2): 2 [IU] via SUBCUTANEOUS
  Administered 2023-08-03: 1 [IU] via SUBCUTANEOUS

## 2023-07-31 MED ORDER — IPRATROPIUM-ALBUTEROL 0.5-2.5 (3) MG/3ML IN SOLN
9.0000 mL | Freq: Once | RESPIRATORY_TRACT | Status: AC
  Administered 2023-07-31: 9 mL via RESPIRATORY_TRACT
  Filled 2023-07-31: qty 9

## 2023-07-31 MED ORDER — ISOSORBIDE MONONITRATE ER 60 MG PO TB24
60.0000 mg | ORAL_TABLET | Freq: Every day | ORAL | Status: DC
  Administered 2023-08-01 – 2023-08-03 (×3): 60 mg via ORAL
  Filled 2023-07-31 (×3): qty 1

## 2023-07-31 MED ORDER — PREGABALIN 75 MG PO CAPS
150.0000 mg | ORAL_CAPSULE | Freq: Three times a day (TID) | ORAL | Status: DC
  Administered 2023-08-01 – 2023-08-03 (×7): 150 mg via ORAL
  Filled 2023-07-31 (×8): qty 2

## 2023-07-31 MED ORDER — PREDNISONE 20 MG PO TABS
60.0000 mg | ORAL_TABLET | Freq: Once | ORAL | Status: AC
  Administered 2023-07-31: 60 mg via ORAL
  Filled 2023-07-31: qty 3

## 2023-07-31 MED ORDER — LEVOTHYROXINE SODIUM 75 MCG PO TABS
75.0000 ug | ORAL_TABLET | Freq: Every day | ORAL | Status: DC
  Administered 2023-08-01 – 2023-08-03 (×3): 75 ug via ORAL
  Filled 2023-07-31 (×3): qty 1

## 2023-07-31 MED ORDER — PROCHLORPERAZINE EDISYLATE 10 MG/2ML IJ SOLN
10.0000 mg | Freq: Once | INTRAMUSCULAR | Status: AC
  Administered 2023-07-31: 10 mg via INTRAVENOUS
  Filled 2023-07-31: qty 2

## 2023-07-31 MED ORDER — SODIUM CHLORIDE 0.9 % IV SOLN
12.5000 mg | Freq: Four times a day (QID) | INTRAVENOUS | Status: DC | PRN
  Administered 2023-07-31 – 2023-08-02 (×3): 12.5 mg via INTRAVENOUS
  Filled 2023-07-31: qty 12.5
  Filled 2023-07-31 (×2): qty 0.5

## 2023-07-31 MED ORDER — IOHEXOL 350 MG/ML SOLN
75.0000 mL | Freq: Once | INTRAVENOUS | Status: AC | PRN
  Administered 2023-07-31: 75 mL via INTRAVENOUS

## 2023-07-31 MED ORDER — METOPROLOL SUCCINATE ER 25 MG PO TB24
25.0000 mg | ORAL_TABLET | Freq: Every day | ORAL | Status: DC
  Administered 2023-08-01 – 2023-08-03 (×3): 25 mg via ORAL
  Filled 2023-07-31 (×3): qty 1

## 2023-07-31 NOTE — Progress Notes (Signed)
CPAP on standby due to pt complaining of nausea at this time.

## 2023-07-31 NOTE — H&P (Signed)
History and Physical    ABI BAAS Shelby Anderson DOB: [birth year] DOA: 07/31/2023  PCP: Karie Georges, MD  Patient coming from: Home  I have personally briefly reviewed patient's old medical records in [facility]  Chief Complaint: Nausea and vomiting  HPI: Shelby Anderson is a 67 y.o. female with medical history significant for asthma, T2DM, HTN, HLD, hypothyroidism, depression/anxiety, chronic pain on chronic narcotics, OSA on CPAP who presented to the ED for evaluation of nausea and vomiting.  Patient reports 3 days of persistent nausea and vomiting and inability maintain any adequate oral intake.  She also reports generalized abdominal pain including suprapubic pain as well as increased urinary frequency.  She has been having alternating episodes of diaphoresis and chills.  ED Course  Labs/Imaging on admission: I have personally reviewed following labs and imaging studies.  Initial vitals showed BP 147/61, pulse 67, RR 20, temp 97.6 F, SpO2 100% room air.  Labs show WBC 8.8, hemoglobin 13.9, platelets 246,000, sodium 141, potassium 3.5, bicarb 24, BUN 18, creatinine 0.96, serum glucose 170, LFTs within normal limits, lipase 25, troponin negative x 2, BNP 100.8.  UA showed negative nitrite, large excite, 3-5 RBCs, >50 WBCs, few bacteria on microscopy.  Urine culture collected and pending.  CT chest/abdomen/pelvis with contrast negative for acute or focal lesion to explain patient's symptoms.  CAD, small hiatal hernia, fat herniates into the inguinal canals bilaterally without associated bowel also reported.  Patient was given 1 L LR, IV ceftriaxone, DuoNeb treatment, oral prednisone 60 mg, IV Phenergan, Compazine, Zofran, Benadryl.  The hospitalist service was consulted to admit for further evaluation management of intractable nausea vomiting.  Review of Systems: All systems reviewed and are negative except as documented in history of present illness  above.   Past Medical History:  Diagnosis Date   Allergy    Anemia    "chronic"   Angina    Prinzmetal angina   Anxiety and depression    Arthritis    Asthma    Atrial fibrillation (HCC)    h/o "AF w/frequent PVCs"   Breast cancer (HCC)    Left   Cancer (HCC)    hx of skin cancer    CHF (congestive heart failure) (HCC)    Chronic back pain greater than 3 months duration    on chronic narcotics, treated at pain clinic   Chronic respiratory failure (HCC) 09/15/2015   ONO 09/04/15 + desats >begin O2 at 2l/ m    Colon polyps    hyperplastic   Coronary artery disease    Arrythmia, orthostatic hypotension, HLD, HTN; sees Dr. Jacinto Halim   Difficult intubation    "TMJ & woke up when they were still cutting on me"   Dysrhythmia    sees Dr. 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 asthma   Stenotic cervical os    Stomach ulcer    "3 small; found in 05/2011"   TMJ (dislocation of temporomandibular joint)    Tuberculosis    + TB SKIN TEST   Type 2 diabetes mellitus  without complication, without long-term current use of insulin (HCC) 12/06/2015   not on meds     Past Surgical History:  Procedure Laterality Date   ACHILLES TENDON REPAIR  1970's   left ankle   ARTHROSCOPIC REPAIR ACL     left knee cap   BREAST BIOPSY Right 04/08/2013   again in October or November 2020   BREAST LUMPECTOMY Left 12/09/2019   BREAST LUMPECTOMY WITH RADIOACTIVE SEED AND SENTINEL LYMPH NODE BIOPSY Left 12/09/2019   Procedure: LEFT BREAST LUMPECTOMY WITH RADIOACTIVE SEED AND LEFT AXILLARY SENTINEL LYMPH NODE BIOPSY;  Surgeon: Emelia Loron, MD;  Location: [facility] OR;  Service: General;  Laterality: Left;  PEC BLOCK   CARDIAC CATHETERIZATION     loop recorder   CARPAL TUNNEL RELEASE  unknown   left hand   COLONOSCOPY     ESOPHAGOGASTRODUODENOSCOPY (EGD) WITH PROPOFOL N/A 10/29/2017   Procedure: ESOPHAGOGASTRODUODENOSCOPY (EGD) WITH PROPOFOL;  Surgeon: Hilarie Fredrickson, MD;  Location: [facility] ENDOSCOPY;  Service: Endoscopy;  Laterality: N/A;   LOOP RECORDER IMPLANT     PORT-A-CATH REMOVAL N/A 12/09/2019   Procedure: REMOVAL PORT-A-CATH;  Surgeon: Emelia Loron, MD;  Location: [facility] OR;  Service: General;  Laterality: N/A;   PORTACATH PLACEMENT N/A 09/23/2019   Procedure: INSERTION PORT-A-CATH Right Internal Feliberto Harts;  Surgeon: Emelia Loron, MD;  Location: [facility] OR;  Service: General;  Laterality: N/A;   post ganglionectomy  1970's   "for migraine headaches"   pouch string  93,23,55   "did this 3 times (once w/each pregnancy)"   SAVORY DILATION N/A 10/29/2017   Procedure: SAVORY DILATION;  Surgeon: Hilarie Fredrickson, MD;  Location: [facility] ENDOSCOPY;  Service: Endoscopy;  Laterality: N/A;   TOTAL KNEE ARTHROPLASTY Left 09/25/2016   Procedure: LEFT TOTAL KNEE ARTHROPLASTY;  Surgeon: Durene Romans, MD;  Location: [facility] ORS;  Service: Orthopedics;  Laterality: Left;   TUBAL LIGATION  1980's    Social History:  reports that she has never smoked. She has never used smokeless  tobacco. She reports that she does not drink alcohol and does not use drugs.  Allergies  Allergen Reactions   Lodine [Etodolac] Anaphylaxis, Hives and Swelling   Oxycontin [Oxycodone Hcl] Anaphylaxis    hives, trouble breathing, tongue swelling (Only Oxycontin) Tolerates plain oxycodone.   Penicillins Anaphylaxis    Told by a surgeon never to take it again. Has patient had a PCN reaction causing immediate rash, facial/tongue/throat swelling, SOB or lightheadedness with hypotension: Yes Has patient had a PCN reaction causing severe rash involving mucus membranes or skin necrosis: Unknown Has patient had a PCN reaction that required hospitalization: No Has patient had a PCN reaction occurring within the last 10 years: No If all of the above answers are "NO", then may proceed  with Cephalosporin use.   Aspirin Other (See Comments)    High-dose caused GI Bleeds   Darvocet [Propoxyphene N-Acetaminophen] Hives   Nitroglycerin Other (See Comments)    IV-BP drops dramatically Can take po   Propoxyphene Hives   Tramadol Hives and Itching   Valium Other (See Comments)    Circulation problems. "Legs turned black".    Family History  Problem Relation Age of Onset   Malignant hyperthermia Father    Hypertension Father    Heart disease Father    Diabetes Father    Cancer Father        skin   Hypertension Mother    Heart disease Mother    Multiple myeloma Mother    Cancer Sister        CERVICAL   Hypertension Sister    Cancer Brother 47       MELANOMA   Heart disease Maternal Grandmother    Other Maternal Grandmother 32       complications of childbirth   Heart disease Maternal Grandfather    Cancer Paternal Grandmother        ?    Heart disease Paternal Grandmother    Heart disease Paternal Grandfather    Cancer Brother        LUNG   Diabetes Sister    Hypertension Sister    Heart disease Sister    Cancer Sister    Cancer Brother    Pancreatic cancer Niece 75   Cancer  Nephew 40       unknown- currently in the [employer]   Anesthesia problems Neg Hx    Hypotension Neg Hx    Pseudochol deficiency Neg Hx    Colon cancer Neg Hx    Esophageal cancer Neg Hx    Rectal cancer Neg Hx    Stomach cancer Neg Hx    Breast cancer Neg Hx      Prior to Admission medications   Medication Sig Start Date End Date Taking? Authorizing Provider  albuterol (PROVENTIL) (2.5 MG/3ML) 0.083% nebulizer solution Take 3 mLs (2.5 mg total) by nebulization every 6 (six) hours as needed for wheezing or shortness of breath (J45.40). 09/19/16   Roslynn Amble, MD  albuterol (VENTOLIN HFA) 108 (90 Base) MCG/ACT inhaler TAKE 2 PUFFS BY MOUTH EVERY 6 HOURS AS NEEDED FOR WHEEZE OR SHORTNESS OF BREATH 05/15/23   Parrett, Virgel Bouquet, NP  aspirin EC 81 MG tablet Take 81 mg at bedtime by mouth.     [provider]  atorvastatin (LIPITOR) 80 MG tablet TAKE ONE TABLET BY MOUTH EVERYDAY AT BEDTIME Needs appointment for further refills 07/08/23   Karie Georges, MD  Blood Glucose Monitoring Suppl (ONE TOUCH ULTRA 2) w/Device KIT Use as directed 04/06/22   Wynn Banker, MD  budesonide-formoterol (SYMBICORT) 80-4.5 MCG/ACT inhaler Inhale 2 puffs into the lungs every morning and another 2 puffs 12 hours later. 04/10/22   Parrett, Virgel Bouquet, NP  buPROPion (WELLBUTRIN XL) 150 MG 24 hr tablet Take 1 tablet (150 mg total) by mouth daily. 03/27/23   Karie Georges, MD  Calcium Carbonate-Vitamin D 600-200 MG-UNIT TABS Take 1 tablet by mouth daily. 01/01/11   [provider]  CINNAMON PO Take 1,000 mg 2 (two) times daily by mouth.    [provider]  dapagliflozin propanediol (FARXIGA) 10 MG TABS tablet Take 1 tablet (10 mg total) by mouth daily before breakfast. 07/18/23   Karie Georges, MD  doxepin (SINEQUAN) 10 MG  capsule Take by mouth. 05/01/22   [provider]  esomeprazole (NEXIUM) 40 MG capsule Take 1 capsule (40 mg total) by mouth 2 (two) times daily. Office  visit for further refills 07/24/23   Hilarie Fredrickson, MD  famotidine (PEPCID) 20 MG tablet TAKE ONE TABLET BY MOUTH TWICE DAILY FOR INDIGESTION / heartburn 05/08/23   Hilarie Fredrickson, MD  fluticasone [facility]) 50 MCG/ACT nasal spray Place into the nose.    [provider]  furosemide (LASIX) 20 MG tablet 1 tablet daily as needed for ankle swelling 02/05/23   Karie Georges, MD  glipiZIDE (GLUCOTROL XL) 5 MG 24 hr tablet TAKE ONE TABLET BY MOUTH EVERY MORNING Needs appointment for further refills 07/08/23   Karie Georges, MD  Glucosamine 750 MG TABS Take by mouth. 11/02/14   [provider]  glucose blood (ONETOUCH ULTRA) test strip 1 each by Other route 2 (two) times daily. 05/06/23   Karie Georges, MD  hydrALAZINE (APRESOLINE) 25 MG tablet Take 1 tablet (25 mg total) by mouth daily as needed (for BP >140/90 mmHg, up to three times daily). 05/01/23 08/29/23  Yates Decamp, MD  HYDROcodone-acetaminophen (NORCO) 5-325 MG tablet Take 1 tablet by mouth every 6 (six) hours as needed for moderate pain. 06/17/23   Candelaria Stagers, DPM  hydrocortisone 2.5 % cream Apply topically as needed. 10/06/21   [provider]  isosorbide mononitrate (IMDUR) 60 MG 24 hr tablet TAKE ONE TABLET BY MOUTH EVERY MORNING Needs appointment for further refills 07/08/23   Karie Georges, MD  L-Methylfolate (DEPLIN) 7.5 MG TABS Take 7.5 mg by mouth daily with breakfast.    [provider]  Lancets (ONETOUCH DELICA PLUS LANCET30G) MISC USE TO CHECK BLOOD SUGAR 2 TIMES A DAY 10/04/21   Wynn Banker, MD  levothyroxine (SYNTHROID) 75 MCG tablet Take 1 tablet (75 mcg total) by mouth daily before breakfast. 09/06/22   Karie Georges, MD  lisinopril (ZESTRIL) 5 MG tablet TAKE ONE TABLET BY MOUTH EVERY MORNING 03/27/23   Karie Georges, MD  LYRICA 150 MG capsule Take 150 mg by mouth 3 (three) times daily.  05/28/18   [provider]  metFORMIN (GLUCOPHAGE) 500 MG tablet TAKE ONE TABLET  BY MOUTH TWICE DAILY WITH A MEAL Needs appointment for further refills 07/08/23   Karie Georges, MD  metoprolol succinate (TOPROL-XL) 25 MG 24 hr tablet Take 1 tablet (25 mg total) by mouth daily. TAKE WITH OR IMMEDIATELY FOLLOWING A MEAL. 05/09/23   Tolia, Sunit, DO  Misc Natural Products (GLUCOS-CHONDROIT-MSM COMPLEX PO) Take 2 tablets by mouth daily.    [provider]  mometasone (ELOCON) 0.1 % cream Apply topically. 09/12/20   [provider]  montelukast (SINGULAIR) 10 MG tablet TAKE ONE TABLET BY MOUTH EVERYDAY AT BEDTIME 07/08/23   Parrett, Tammy S, NP  Multiple Vitamin (MULITIVITAMIN WITH MINERALS) TABS Take 1 tablet by mouth daily.    [provider]  mupirocin cream (BACTROBAN) 2 % Apply once daily to lesions on feet as needed. 12/25/19   Freddie Breech, DPM  nitroGLYCERIN (NITROSTAT) 0.4 MG SL tablet Place 1 tablet (0.4 mg total) under the tongue every 5 (five) minutes as needed for chest pain. 05/01/23   Yates Decamp, MD  NURTEC 75 MG TBDP Take 1 each by mouth daily as needed. 10/29/22   Karie Georges, MD  ondansetron (ZOFRAN) 4 MG tablet Take 1 tablet (4 mg total) by mouth every 8 (  eight) hours as needed for nausea or vomiting. 04/19/22   Hilarie Fredrickson, MD  [facility] ULTRA test strip USE TO CHECK BLOOD SUGAR 2 TIMES A DAY 01/01/22   Wynn Banker, MD  oxyCODONE (ROXICODONE) 15 MG immediate release tablet Take 1 tablet by mouth four times a day 06/06/22     potassium chloride (KLOR-CON) 10 MEQ tablet TAKE TWO TABLETS BY MOUTH EVERY MORNING 06/05/23   Karie Georges, MD  promethazine (PHENERGAN) 25 MG tablet TAKE ONE TABLET BY MOUTH EVERYDAY AT BEDTIME 11/29/22   Hilarie Fredrickson, MD  tiZANidine (ZANAFLEX) 4 MG tablet Take 4 mg by mouth every 8 (eight) hours as needed for muscle spasms.    [provider]  vitamin B-12 (CYANOCOBALAMIN) 500 MCG tablet Take 500 mcg by mouth daily.     [provider]  vitamin E 200 UNIT capsule Take 200  Units by mouth daily.    [provider]    Physical Exam: Vitals:   07/31/23 1448 07/31/23 1719 07/31/23 1750 07/31/23 1800  BP: (!) 147/61  123/62 (!) 123/59  Pulse: 67  93 91  Resp: 20  17   Temp: 97.6 F (36.4 C)  98.3 F (36.8 C)   TempSrc: Oral  Oral   SpO2: 100% 100% 100% 100%   Constitutional: Resting in bed, NAD, calm, comfortable Eyes: EOMI, lids and conjunctivae normal ENMT: Mucous membranes are moist. Posterior pharynx clear of any exudate or lesions.Normal dentition.  Neck: normal, supple, no masses. Respiratory: clear to auscultation bilaterally, no wheezing, no crackles. Normal respiratory effort. No accessory muscle use.  Cardiovascular: Regular rate and rhythm, soft stock murmur. No extremity edema. 2+ pedal pulses. Abdomen: Generalized abdominal tenderness, suprapubic tenderness.  No masses palpated.  Musculoskeletal: no clubbing / cyanosis. No joint deformity upper and lower extremities. Good ROM, no contractures. Normal muscle tone.  Skin: no rashes, lesions, ulcers. No induration Neurologic: Sensation intact. Strength 5/5 in all 4.  Psychiatric: Normal judgment and insight. Alert and oriented x 3. Normal mood.   EKG: Not performed.  Assessment/Plan Principal Problem:   Nausea and vomiting Active Problems:   Acute cystitis   Asthma   OSA (obstructive sleep apnea)   HTN (hypertension), benign   Chronic back pain   Type 2 diabetes mellitus without complication, without long-term current use of insulin (HCC)   Hyperlipidemia associated with type 2 diabetes mellitus (HCC)   Hypothyroidism   Shelby Anderson is a 67 y.o. female with medical history significant for asthma, T2DM, HTN, HLD, hypothyroidism, depression/anxiety, chronic pain on chronic narcotics, OSA on CPAP who is admitted with nausea and vomiting.  Assessment and Plan: Intractable nausea vomiting: Continued symptoms despite multiple antiemetics given while in the ED.  CT A/P  unrevealing for acute etiology.  Potentially related to acute cystitis. -Continue IV fluid hydration overnight -IV Phenergan as needed -Advance diet as tolerated  Acute cystitis: Continue IV ceftriaxone.  Follow urine culture.  Asthma: Stable on admission.  Continue Symbicort, Singulair, albuterol as needed.  Type 2 diabetes: Hold home meds, placed on SSI.  Hypertension: Continue Imdur, lisinopril, Toprol-XL.  Hypothyroidism: Continue Synthroid.  Hyperlipidemia: Continue atorvastatin.  Chronic pain: Continue home Oxy IR and Lyrica. OSA: Continue CPAP nightly.   DVT prophylaxis: enoxaparin (LOVENOX) injection 40 mg Start: 07/31/23 2200 Code Status: Full code, confirmed with patient on admission Family Communication: Husband at bedside Disposition Plan: From home and likely discharge to home pending clinical progress Consults called: None Severity of Illness: The  appropriate patient status for this patient is OBSERVATION. Observation status is judged to be reasonable and necessary in order to provide the required intensity of service to ensure the patient's safety. The patient's presenting symptoms, physical exam findings, and initial radiographic and laboratory data in the context of their medical condition is felt to place them at decreased risk for further clinical deterioration. Furthermore, it is anticipated that the patient will be medically stable for discharge from the hospital within 2 midnights of admission.   Darreld Mclean MD [facility]  If 7PM-7AM, please contact night-coverage [email or web address]  07/31/2023, 9:59 PM

## 2023-07-31 NOTE — ED Provider Notes (Signed)
[facility] Provider Note  CSN: [id number] Arrival date & time: 07/31/23 1128  Chief Complaint(s) Nausea  HPI Shelby [facility] is a 67 y.o. female with PMH A-fib, CHF, CAD, GERD, pneumonia who presents emergency department for evaluation of multiple complaints including abdominal pain, nausea and shortness of breath.  She states that for the last 3 days she has had persistent nausea and vomiting and unable to tolerate p.o.  Endorses generalized bodyaches and chills.  States that today she started to feel short of breath and arrives with complaints of abdominal pain, nausea, vomiting, shortness of breath and headache today.  Denies chest pain, diarrhea or other systemic symptoms.   Past Medical History Past Medical History:  Diagnosis Date   Allergy    Anemia    "chronic"   Angina    Prinzmetal angina   Anxiety and depression    Arthritis    Asthma    Atrial fibrillation (HCC)    h/o "AF w/frequent PVCs"   Breast cancer (HCC)    Left   Cancer (HCC)    hx of skin cancer    CHF (congestive heart failure) (HCC)    Chronic back pain greater than 3 months duration    on chronic narcotics, treated at pain clinic   Chronic respiratory failure (HCC) 09/15/2015   ONO 09/04/15 + desats >begin O2 at 2l/ m    Colon polyps    hyperplastic   Coronary artery disease    Arrythmia, orthostatic hypotension, HLD, HTN; sees Dr. Jacinto Halim   Difficult intubation    "TMJ & woke up when they were still cutting on me"   Dysrhythmia    sees Dr. Jacinto Halim and a cardiologist at [facility]   Esophageal stricture    Family history of melanoma ]   Family history of pancreatic cancer    Fatty liver    Fatty liver    Fibroids    Fibromyalgia    "in my legs"   GERD (gastroesophageal reflux disease)    hx hiatal hernia, stricture and gastric ulcer   Headache(784.0)    migraines   Heart murmur    Hiatal hernia    History of loop recorder    History of  migraines    "dx'd when I was in my teens"   Hyperlipemia    Hypertension    Mental disorder    Mild episode of recurrent major depressive disorder (HCC) 12/06/2015   Myocardial infarction [facility]) 1980's & 1990;   sees Dr. 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 asthma   Stenotic cervical os    Stomach ulcer    "3 small; found in 05/2011"   TMJ (dislocation of temporomandibular joint)    Tuberculosis    + TB SKIN TEST   Type 2 diabetes mellitus without complication, without long-term current use of insulin (HCC) 12/06/2015   not on meds    Patient Active Problem List   Diagnosis Date Noted   Hypothyroidism 09/11/2022   Dental caries 06/12/2022   Spondylosis without myelopathy or radiculopathy, cervical region 04/21/2021   Neutropenia with fever (HCC) 10/01/2019  Genetic testing 09/17/2019   Family history of melanoma    Family history of pancreatic cancer    Malignant neoplasm of upper-outer quadrant of left breast in female, estrogen receptor negative (HCC) 09/07/2019   Encounter for loop recorder at end of battery life 04/24/2018   Esophageal stricture 07/01/2017   Hyperlipidemia associated with type 2 diabetes mellitus (HCC) 05/20/2017   Allergic rhinitis 03/11/2017   Dilated cardiomyopathy (HCC) 02/07/2017   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 11/13/2016   Morbid obesity due to excess calories (HCC) 09/26/2016   Increased endometrial stripe thickness 06/27/2016   Intramural leiomyoma of uterus 06/27/2016   Type 2 diabetes mellitus without complication, without long-term current use of insulin (HCC) 12/06/2015   HTN (hypertension), benign 07/19/2015   Arrhythmia 07/19/2015    Orthostatic hypotension 07/19/2015   GERD (gastroesophageal reflux disease) 07/19/2015   Chronic back pain 07/19/2015   Neuropathy 07/19/2015   Symptomatic PVCs 11/02/2014   Syncope 11/02/2014   Status post placement of implantable loop recorder 11/02/2014   Stenosis of cervix 01/07/2014   Chest pain 12/23/2013   Disorder of cervix 03/10/2013   Vaginal atrophy 03/10/2013   OSA (obstructive sleep apnea) 07/31/2012   Asthma 04/29/2012   Coronary artery disease 11/20/2011   Home Medication(s) Prior to Admission medications   Medication Sig Start Date End Date Taking? Authorizing Provider  albuterol (PROVENTIL) (2.5 MG/3ML) 0.083% nebulizer solution Take 3 mLs (2.5 mg total) by nebulization every 6 (six) hours as needed for wheezing or shortness of breath (J45.40). 09/19/16   Roslynn Amble, MD  albuterol (VENTOLIN HFA) 108 (90 Base) MCG/ACT inhaler TAKE 2 PUFFS BY MOUTH EVERY 6 HOURS AS NEEDED FOR WHEEZE OR SHORTNESS OF BREATH 05/15/23   Parrett, Virgel Bouquet, NP  aspirin EC 81 MG tablet Take 81 mg at bedtime by mouth.     [provider]  atorvastatin (LIPITOR) 80 MG tablet TAKE ONE TABLET BY MOUTH EVERYDAY AT BEDTIME Needs appointment for further refills 07/08/23   Karie Georges, MD  Blood Glucose Monitoring Suppl (ONE TOUCH ULTRA 2) w/Device KIT Use as directed 04/06/22   Wynn Banker, MD  budesonide-formoterol (SYMBICORT) 80-4.5 MCG/ACT inhaler Inhale 2 puffs into the lungs every morning and another 2 puffs 12 hours later. 04/10/22   Parrett, Virgel Bouquet, NP  buPROPion (WELLBUTRIN XL) 150 MG 24 hr tablet Take 1 tablet (150 mg total) by mouth daily. 03/27/23   Karie Georges, MD  Calcium Carbonate-Vitamin D 600-200 MG-UNIT TABS Take 1 tablet by mouth daily. 01/01/11   [provider]  CINNAMON PO Take 1,000 mg 2 (two) times daily by mouth.    [provider]  dapagliflozin propanediol (FARXIGA) 10 MG TABS tablet Take 1 tablet (10 mg total) by mouth daily before  breakfast. 07/18/23   Karie Georges, MD  doxepin (SINEQUAN) 10 MG capsule Take by mouth. 05/01/22   [provider]  esomeprazole (NEXIUM) 40 MG capsule Take 1 capsule (40 mg total) by mouth 2 (two) times daily. Office visit for further refills 07/24/23   Hilarie Fredrickson, MD  famotidine (PEPCID) 20 MG tablet TAKE ONE TABLET BY MOUTH TWICE DAILY FOR INDIGESTION / heartburn 05/08/23   Hilarie Fredrickson, MD  fluticasone [facility]) 50 MCG/ACT nasal spray Place into the nose.    [provider]  furosemide (LASIX) 20 MG tablet 1 tablet daily as needed for ankle swelling 02/05/23   Karie Georges, MD  glipiZIDE (GLUCOTROL XL) 5 MG 24  hr tablet TAKE ONE TABLET BY MOUTH EVERY MORNING Needs appointment for further refills 07/08/23   Karie Georges, MD  Glucosamine 750 MG TABS Take by mouth. 11/02/14   [provider]  glucose blood (ONETOUCH ULTRA) test strip 1 each by Other route 2 (two) times daily. 05/06/23   Karie Georges, MD  hydrALAZINE (APRESOLINE) 25 MG tablet Take 1 tablet (25 mg total) by mouth daily as needed (for BP >140/90 mmHg, up to three times daily). 05/01/23 08/29/23  Yates Decamp, MD  HYDROcodone-acetaminophen (NORCO) 5-325 MG tablet Take 1 tablet by mouth every 6 (six) hours as needed for moderate pain. 06/17/23   Candelaria Stagers, DPM  hydrocortisone 2.5 % cream Apply topically as needed. 10/06/21   [provider]  isosorbide mononitrate (IMDUR) 60 MG 24 hr tablet TAKE ONE TABLET BY MOUTH EVERY MORNING Needs appointment for further refills 07/08/23   Karie Georges, MD  L-Methylfolate (DEPLIN) 7.5 MG TABS Take 7.5 mg by mouth daily with breakfast.    [provider]  Lancets (ONETOUCH DELICA PLUS LANCET30G) MISC USE TO CHECK BLOOD SUGAR 2 TIMES A DAY 10/04/21   Wynn Banker, MD  levothyroxine (SYNTHROID) 75 MCG tablet Take 1 tablet (75 mcg total) by mouth daily before breakfast. 09/06/22   Karie Georges, MD  lisinopril (ZESTRIL) 5 MG tablet  TAKE ONE TABLET BY MOUTH EVERY MORNING 03/27/23   Karie Georges, MD  LYRICA 150 MG capsule Take 150 mg by mouth 3 (three) times daily.  05/28/18   [provider]  metFORMIN (GLUCOPHAGE) 500 MG tablet TAKE ONE TABLET BY MOUTH TWICE DAILY WITH A MEAL Needs appointment for further refills 07/08/23   Karie Georges, MD  metoprolol succinate (TOPROL-XL) 25 MG 24 hr tablet Take 1 tablet (25 mg total) by mouth daily. TAKE WITH OR IMMEDIATELY FOLLOWING A MEAL. 05/09/23   Tolia, Sunit, DO  Misc Natural Products (GLUCOS-CHONDROIT-MSM COMPLEX PO) Take 2 tablets by mouth daily.    [provider]  mometasone (ELOCON) 0.1 % cream Apply topically. 09/12/20   [provider]  montelukast (SINGULAIR) 10 MG tablet TAKE ONE TABLET BY MOUTH EVERYDAY AT BEDTIME 07/08/23   Parrett, Tammy S, NP  Multiple Vitamin (MULITIVITAMIN WITH MINERALS) TABS Take 1 tablet by mouth daily.    [provider]  mupirocin cream (BACTROBAN) 2 % Apply once daily to lesions on feet as needed. 12/25/19   Freddie Breech, DPM  nitroGLYCERIN (NITROSTAT) 0.4 MG SL tablet Place 1 tablet (0.4 mg total) under the tongue every 5 (five) minutes as needed for chest pain. 05/01/23   Yates Decamp, MD  NURTEC 75 MG TBDP Take 1 each by mouth daily as needed. 10/29/22   Karie Georges, MD  ondansetron (ZOFRAN) 4 MG tablet Take 1 tablet (4 mg total) by mouth every 8 (eight) hours as needed for nausea or vomiting. 04/19/22   Hilarie Fredrickson, MD  [facility] ULTRA test strip USE TO CHECK BLOOD SUGAR 2 TIMES A DAY 01/01/22   Wynn Banker, MD  oxyCODONE (ROXICODONE) 15 MG immediate release tablet Take 1 tablet by mouth four times a day 06/06/22     potassium chloride (KLOR-CON) 10 MEQ tablet TAKE TWO TABLETS BY MOUTH EVERY MORNING 06/05/23   Karie Georges, MD  promethazine (PHENERGAN) 25 MG tablet TAKE ONE TABLET BY MOUTH EVERYDAY AT BEDTIME 11/29/22   Hilarie Fredrickson, MD  tiZANidine (ZANAFLEX) 4 MG tablet Take 4 mg by  mouth every 8 (eight) hours as needed for muscle spasms.    [provider]  vitamin B-12 (CYANOCOBALAMIN) 500 MCG tablet Take 500 mcg by mouth daily.     [provider]  vitamin E 200 UNIT capsule Take 200 Units by mouth daily.    [provider]                                                                                                                                    Past Surgical History Past Surgical History:  Procedure Laterality Date   ACHILLES TENDON REPAIR  1970's   left ankle   ARTHROSCOPIC REPAIR ACL     left knee cap   BREAST BIOPSY Right 04/08/2013   again in October or November 2020   BREAST LUMPECTOMY Left 12/09/2019   BREAST LUMPECTOMY WITH RADIOACTIVE SEED AND SENTINEL LYMPH NODE BIOPSY Left 12/09/2019   Procedure: LEFT BREAST LUMPECTOMY WITH RADIOACTIVE SEED AND LEFT AXILLARY SENTINEL LYMPH NODE BIOPSY;  Surgeon: Emelia Loron, MD;  Location: [facility] OR;  Service: General;  Laterality: Left;  PEC BLOCK   CARDIAC CATHETERIZATION     loop recorder   CARPAL TUNNEL RELEASE  unknown   left hand   COLONOSCOPY     ESOPHAGOGASTRODUODENOSCOPY (EGD) WITH PROPOFOL N/A 10/29/2017   Procedure: ESOPHAGOGASTRODUODENOSCOPY (EGD) WITH PROPOFOL;  Surgeon: Hilarie Fredrickson, MD;  Location: [facility];  Service: [facility];  Laterality: N/A;   LOOP RECORDER IMPLANT     PORT-A-CATH REMOVAL N/A 12/09/2019   Procedure: REMOVAL PORT-A-CATH;  Surgeon: Emelia Loron, MD;  Location: [facility] OR;  Service: General;  Laterality: N/A;   PORTACATH PLACEMENT N/A 09/23/2019   Procedure: INSERTION PORT-A-CATH Right Internal Feliberto Harts;  Surgeon: Emelia Loron, MD;  Location: [facility] OR;  Service: General;  Laterality: N/A;   post ganglionectomy  1970's   "for migraine headaches"   pouch string  16,10,96   "did this 3 times (once w/each pregnancy)"   SAVORY DILATION N/A 10/29/2017   Procedure: SAVORY DILATION;  Surgeon: Hilarie Fredrickson, MD;  Location: [facility];  Service: [facility];  Laterality: N/A;   TOTAL KNEE ARTHROPLASTY Left 09/25/2016   Procedure: LEFT TOTAL KNEE ARTHROPLASTY;  Surgeon: Durene Romans, MD;  Location: [facility];  Service: Orthopedics;  Laterality: Left;   TUBAL LIGATION  1980's   Family History Family History  Problem Relation Age of Onset   Malignant hyperthermia Father    Hypertension Father    Heart disease Father    Diabetes Father    Cancer Father        skin   Hypertension Mother    Heart disease Mother    Multiple myeloma Mother    Cancer Sister        CERVICAL   Hypertension Sister    Cancer Brother 14       MELANOMA   Heart disease Maternal Grandmother    Other Maternal Grandmother 31  complications of childbirth   Heart disease Maternal Grandfather    Cancer Paternal Grandmother        ?    Heart disease Paternal Grandmother    Heart disease Paternal Grandfather    Cancer Brother        LUNG   Diabetes Sister    Hypertension Sister    Heart disease Sister    Cancer Sister    Cancer Brother    Pancreatic cancer Niece 42   Cancer Nephew 40       unknown- currently in the [employer]   Anesthesia problems Neg Hx    Hypotension Neg Hx    Pseudochol deficiency Neg Hx    Colon cancer Neg Hx    Esophageal cancer Neg Hx    Rectal cancer Neg Hx    Stomach cancer Neg Hx    Breast cancer Neg Hx     Social History Social History   Tobacco Use   Smoking status: Never   Smokeless tobacco: Never  Vaping Use   Vaping status: Never Used  Substance Use Topics   Alcohol use: No    Alcohol/week: 0.0 standard drinks of alcohol   Drug use: No   Allergies Lodine [etodolac], Oxycodone, Oxycontin [oxycodone hcl], Penicillins, Aspirin, Darvocet [propoxyphene n-acetaminophen], Nitroglycerin, Propoxyphene, Tramadol, Ultram [tramadol hcl], and Valium  Review of Systems Review of Systems  Respiratory:  Positive for shortness of breath.   Gastrointestinal:  Positive for abdominal pain, nausea and  vomiting.  Genitourinary:  Positive for dysuria.    Physical Exam Vital Signs  I have reviewed the triage vital signs BP (!) 147/61 (BP Location: Right Arm)   Pulse 67   Temp 97.6 F (36.4 C) (Oral)   Resp 20   SpO2 100%   Physical Exam Vitals and nursing note reviewed.  Constitutional:      General: She is not in acute distress.    Appearance: She is well-developed. She is ill-appearing.  HENT:     Head: Normocephalic and atraumatic.  Eyes:     Conjunctiva/sclera: Conjunctivae normal.  Cardiovascular:     Rate and Rhythm: Normal rate and regular rhythm.     Heart sounds: No murmur heard. Pulmonary:     Effort: Pulmonary effort is normal. No respiratory distress.     Breath sounds: Wheezing present.  Abdominal:     Tenderness: There is abdominal tenderness.  Musculoskeletal:        General: No swelling.     Cervical back: Neck supple.  Skin:    General: Skin is warm and dry.     Capillary Refill: Capillary refill takes less than 2 seconds.  Neurological:     Mental Status: She is alert.  Psychiatric:        Mood and Affect: Mood normal.     ED Results and Treatments Labs (all labs ordered are listed, but only abnormal results are displayed) Labs Reviewed  COMPREHENSIVE METABOLIC PANEL - Abnormal; Notable for the following components:      Result Value   Glucose, Bld 170 (*)    All other components within normal limits  URINALYSIS, ROUTINE W REFLEX MICROSCOPIC - Abnormal; Notable for the following components:   APPearance HAZY (*)    Specific Gravity, Urine 1.035 (*)    Glucose, UA >=500 (*)    Ketones, ur 80 (*)    Protein, ur 30 (*)    Leukocytes,Ua LARGE (*)    Bacteria, UA FEW (*)    All other components  within normal limits  CBG MONITORING, ED - Abnormal; Notable for the following components:   Glucose-Capillary 137 (*)    All other components within normal limits  URINE CULTURE  LIPASE, BLOOD  CBC                                                                                                                           Radiology No results found.  Pertinent labs & imaging results that were available during my care of the patient were reviewed by me and considered in my medical decision making (see MDM for details).  Medications Ordered in ED Medications  ondansetron (ZOFRAN) injection 4 mg (has no administration in time range)  lactated ringers bolus 1,000 mL (has no administration in time range)  ondansetron (ZOFRAN-ODT) disintegrating tablet 4 mg (4 mg Oral Given 07/31/23 1156)                                                                                                                                     Procedures Procedures  (including critical care time)  Medical Decision Making / ED Course   This patient presents to the ED for concern of abdominal pain, shortness of breath, vomiting, this involves an extensive number of treatment options, and is a complaint that carries with it a high risk of complications and morbidity.  The differential diagnosis includes viral illness, pneumonia, aspiration, pancreatitis, cholecystitis, appendicitis, UTI, pyelonephritis  MDM: Patient seen emergency room for evaluation of multiple complaints as described above.  Physical exam with wheezing bilaterally, generalized abdominal tenderness to palpation.  Laboratory evaluation largely unremarkable outside of a BNP of 100.8 and urinalysis is concerning for infection with large leuk esterase, greater than 50 white blood cells and few bacteria.  Ceftriaxone initiated.  Chest x-ray unremarkable and CT imaging also unremarkable.  3 DuoNebs given for patient's wheezing leading to resolution of her respiratory symptoms.  Headache cocktail given with Compazine and Benadryl leading to improvement of nausea and a headache.  However, on reevaluation, nausea is returning and we did attempt p.o. trial with Phenergan but patient had persistent vomiting.  Thus,  she will require hospital admission for UTI and intractable nausea and vomiting.  Patient then admitted.   Additional history obtained: -Additional history obtained from husband -External records from outside source obtained and reviewed including: Chart review including previous notes, labs, imaging, consultation notes  Lab Tests: -I ordered, reviewed, and interpreted labs.   The pertinent results include:   Labs Reviewed  COMPREHENSIVE METABOLIC PANEL - Abnormal; Notable for the following components:      Result Value   Glucose, Bld 170 (*)    All other components within normal limits  URINALYSIS, ROUTINE W REFLEX MICROSCOPIC - Abnormal; Notable for the following components:   APPearance HAZY (*)    Specific Gravity, Urine 1.035 (*)    Glucose, UA >=500 (*)    Ketones, ur 80 (*)    Protein, ur 30 (*)    Leukocytes,Ua LARGE (*)    Bacteria, UA FEW (*)    All other components within normal limits  CBG MONITORING, ED - Abnormal; Notable for the following components:   Glucose-Capillary 137 (*)    All other components within normal limits  URINE CULTURE  LIPASE, BLOOD  CBC         Imaging Studies ordered: I ordered imaging studies including chest x-ray, CT CAP I independently visualized and interpreted imaging. I agree with the radiologist interpretation   Medicines ordered and prescription drug management: Meds ordered this encounter  Medications   ondansetron (ZOFRAN-ODT) disintegrating tablet 4 mg   ondansetron (ZOFRAN) injection 4 mg   lactated ringers bolus 1,000 mL    -I have reviewed the patients home medicines and have made adjustments as needed  Critical interventions none    Cardiac Monitoring: The patient was maintained on a cardiac monitor.  I personally viewed and interpreted the cardiac monitored which showed an underlying rhythm of: NSR  Social Determinants of Health:  Factors impacting patients care include: none   Reevaluation: After  the interventions noted above, I reevaluated the patient and found that they have :improved  Co morbidities that complicate the patient evaluation  Past Medical History:  Diagnosis Date   Allergy    Anemia    "chronic"   Angina    Prinzmetal angina   Anxiety and depression    Arthritis    Asthma    Atrial fibrillation (HCC)    h/o "AF w/frequent PVCs"   Breast cancer (HCC)    Left   Cancer (HCC)    hx of skin cancer    CHF (congestive heart failure) (HCC)    Chronic back pain greater than 3 months duration    on chronic narcotics, treated at pain clinic   Chronic respiratory failure (HCC) 09/15/2015   ONO 09/04/15 + desats >begin O2 at 2l/ m    Colon polyps    hyperplastic   Coronary artery disease    Arrythmia, orthostatic hypotension, HLD, HTN; sees Dr. Jacinto Halim   Difficult intubation    "TMJ & woke up when they were still cutting on me"   Dysrhythmia    sees Dr. Jacinto Halim and a cardiologist at [facility]   Esophageal stricture    Family history of melanoma ]   Family history of pancreatic cancer    Fatty liver    Fatty liver    Fibroids    Fibromyalgia    "in my legs"   GERD (gastroesophageal reflux disease)    hx hiatal hernia, stricture and gastric ulcer   Headache(784.0)    migraines   Heart murmur    Hiatal hernia    History of loop recorder    History of migraines    "dx'd when I was in my teens"   Hyperlipemia    Hypertension    Mental disorder    Mild  episode of recurrent major depressive disorder (HCC) 12/06/2015   Myocardial infarction [facility]) 1980's & 1990;   sees Dr. 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 asthma   Stenotic cervical os    Stomach ulcer    "3  small; found in 05/2011"   TMJ (dislocation of temporomandibular joint)    Tuberculosis    + TB SKIN TEST   Type 2 diabetes mellitus without complication, without long-term current use of insulin (HCC) 12/06/2015   not on meds       Dispostion: I considered admission for this patient, and given intractable nausea and vomiting in the setting of UTI patient require hospital admission     Final Clinical Impression(s) / ED Diagnoses Final diagnoses:  None     @PCDICTATION @    Glendora Score, MD 08/01/23 1131

## 2023-07-31 NOTE — ED Triage Notes (Signed)
Pt arrives POV to ED. Pt c/o N/V x3 days and states she has not been able to keep anything down since. Pt endorses generalized body aches. Pt endorses diffuse abdominal pain. Pt states she feels weak.

## 2023-07-31 NOTE — Hospital Course (Signed)
Shelby [facility] is a 67 y.o. female with medical history significant for asthma, T2DM, HTN, HLD, hypothyroidism, depression/anxiety, chronic pain on chronic narcotics, OSA on CPAP who is admitted with nausea and vomiting.

## 2023-08-01 ENCOUNTER — Encounter (HOSPITAL_COMMUNITY): Payer: Self-pay | Admitting: Internal Medicine

## 2023-08-01 ENCOUNTER — Other Ambulatory Visit: Payer: Self-pay

## 2023-08-01 DIAGNOSIS — J45909 Unspecified asthma, uncomplicated: Secondary | ICD-10-CM | POA: Diagnosis present

## 2023-08-01 DIAGNOSIS — G4733 Obstructive sleep apnea (adult) (pediatric): Secondary | ICD-10-CM | POA: Diagnosis present

## 2023-08-01 DIAGNOSIS — Z96652 Presence of left artificial knee joint: Secondary | ICD-10-CM | POA: Diagnosis present

## 2023-08-01 DIAGNOSIS — G43909 Migraine, unspecified, not intractable, without status migrainosus: Secondary | ICD-10-CM | POA: Diagnosis present

## 2023-08-01 DIAGNOSIS — N390 Urinary tract infection, site not specified: Secondary | ICD-10-CM | POA: Diagnosis present

## 2023-08-01 DIAGNOSIS — I10 Essential (primary) hypertension: Secondary | ICD-10-CM | POA: Diagnosis present

## 2023-08-01 DIAGNOSIS — I2510 Atherosclerotic heart disease of native coronary artery without angina pectoris: Secondary | ICD-10-CM | POA: Diagnosis present

## 2023-08-01 DIAGNOSIS — Z79890 Hormone replacement therapy (postmenopausal): Secondary | ICD-10-CM | POA: Diagnosis not present

## 2023-08-01 DIAGNOSIS — M797 Fibromyalgia: Secondary | ICD-10-CM | POA: Diagnosis present

## 2023-08-01 DIAGNOSIS — Z9221 Personal history of antineoplastic chemotherapy: Secondary | ICD-10-CM | POA: Diagnosis not present

## 2023-08-01 DIAGNOSIS — Z9981 Dependence on supplemental oxygen: Secondary | ICD-10-CM | POA: Diagnosis not present

## 2023-08-01 DIAGNOSIS — Z85828 Personal history of other malignant neoplasm of skin: Secondary | ICD-10-CM | POA: Diagnosis not present

## 2023-08-01 DIAGNOSIS — M549 Dorsalgia, unspecified: Secondary | ICD-10-CM | POA: Diagnosis present

## 2023-08-01 DIAGNOSIS — Z923 Personal history of irradiation: Secondary | ICD-10-CM | POA: Diagnosis not present

## 2023-08-01 DIAGNOSIS — E119 Type 2 diabetes mellitus without complications: Secondary | ICD-10-CM | POA: Diagnosis present

## 2023-08-01 DIAGNOSIS — F32A Depression, unspecified: Secondary | ICD-10-CM | POA: Diagnosis present

## 2023-08-01 DIAGNOSIS — E785 Hyperlipidemia, unspecified: Secondary | ICD-10-CM | POA: Diagnosis present

## 2023-08-01 DIAGNOSIS — I4891 Unspecified atrial fibrillation: Secondary | ICD-10-CM | POA: Diagnosis present

## 2023-08-01 DIAGNOSIS — E039 Hypothyroidism, unspecified: Secondary | ICD-10-CM | POA: Diagnosis present

## 2023-08-01 DIAGNOSIS — G8929 Other chronic pain: Secondary | ICD-10-CM | POA: Diagnosis present

## 2023-08-01 DIAGNOSIS — R112 Nausea with vomiting, unspecified: Secondary | ICD-10-CM | POA: Diagnosis not present

## 2023-08-01 DIAGNOSIS — I252 Old myocardial infarction: Secondary | ICD-10-CM | POA: Diagnosis not present

## 2023-08-01 DIAGNOSIS — J9610 Chronic respiratory failure, unspecified whether with hypoxia or hypercapnia: Secondary | ICD-10-CM | POA: Diagnosis present

## 2023-08-01 DIAGNOSIS — K760 Fatty (change of) liver, not elsewhere classified: Secondary | ICD-10-CM | POA: Diagnosis present

## 2023-08-01 LAB — CBC
HCT: 38.4 % (ref 36.0–46.0)
Hemoglobin: 12.3 g/dL (ref 12.0–15.0)
MCH: 28.3 pg (ref 26.0–34.0)
MCHC: 32 g/dL (ref 30.0–36.0)
MCV: 88.3 fL (ref 80.0–100.0)
Platelets: 217 10*3/uL (ref 150–400)
RBC: 4.35 MIL/uL (ref 3.87–5.11)
RDW: 15.7 % — ABNORMAL HIGH (ref 11.5–15.5)
WBC: 9.3 10*3/uL (ref 4.0–10.5)
nRBC: 0 % (ref 0.0–0.2)

## 2023-08-01 LAB — BASIC METABOLIC PANEL
Anion gap: 12 (ref 5–15)
BUN: 20 mg/dL (ref 8–23)
CO2: 23 mmol/L (ref 22–32)
Calcium: 8.9 mg/dL (ref 8.9–10.3)
Chloride: 106 mmol/L (ref 98–111)
Creatinine, Ser: 0.84 mg/dL (ref 0.44–1.00)
GFR, Estimated: >60 mL/min (ref >60)
Glucose, Bld: 148 mg/dL — ABNORMAL HIGH (ref 70–99)
Potassium: 3.7 mmol/L (ref 3.5–5.1)
Sodium: 141 mmol/L (ref 135–145)

## 2023-08-01 LAB — GLUCOSE, CAPILLARY
Glucose-Capillary: 110 mg/dL — ABNORMAL HIGH (ref 70–99)
Glucose-Capillary: 80 mg/dL (ref 70–99)
Glucose-Capillary: 103 mg/dL — ABNORMAL HIGH (ref 70–99)
Glucose-Capillary: 122 mg/dL — ABNORMAL HIGH (ref 70–99)

## 2023-08-01 LAB — HIV ANTIBODY (ROUTINE TESTING W REFLEX): HIV Screen 4th Generation wRfx: NONREACTIVE

## 2023-08-01 MED ORDER — SODIUM CHLORIDE 0.9 % IV BOLUS
500.0000 mL | Freq: Once | INTRAVENOUS | Status: AC
  Administered 2023-08-01: 500 mL via INTRAVENOUS

## 2023-08-01 NOTE — ED Notes (Signed)
ED TO INPATIENT HANDOFF REPORT  ED Nurse Name and Phone #: Dahlia Client RN [phone]  S Name/Age/Gender Shelby Anderson 67 y.o. female Room/Bed: 004C/004C  Code Status   Code Status: Full Code  Home/SNF/Other Home Patient oriented to: self, place, time, and situation Is this baseline? Yes   Triage Complete: Triage complete  Chief Complaint Nausea and vomiting [R11.2]  Triage Note Pt arrives POV to ED. Pt c/o N/V x3 days and states she has not been able to keep anything down since. Pt endorses generalized body aches. Pt endorses diffuse abdominal pain. Pt states she feels weak.    Allergies Allergies  Allergen Reactions   Lodine [Etodolac] Anaphylaxis, Hives and Swelling   Oxycontin [Oxycodone Hcl] Anaphylaxis    hives, trouble breathing, tongue swelling (Only Oxycontin) Tolerates plain oxycodone.   Penicillins Anaphylaxis    Told by a surgeon never to take it again. Has patient had a PCN reaction causing immediate rash, facial/tongue/throat swelling, SOB or lightheadedness with hypotension: Yes Has patient had a PCN reaction causing severe rash involving mucus membranes or skin necrosis: Unknown Has patient had a PCN reaction that required hospitalization: No Has patient had a PCN reaction occurring within the last 10 years: No If all of the above answers are "NO", then may proceed with Cephalosporin use.   Aspirin Other (See Comments)    High-dose caused GI Bleeds   Darvocet [Propoxyphene N-Acetaminophen] Hives   Nitroglycerin Other (See Comments)    IV-BP drops dramatically Can take po   Propoxyphene Hives   Tramadol Hives and Itching   Valium Other (See Comments)    Circulation problems. "Legs turned black".    Level of Care/Admitting Diagnosis ED Disposition     ED Disposition  Admit   Condition  --   Comment  Hospital Area: [facility] [[id number]]  Level of Care: Med-Surg [16]  May place patient in observation at [facility] or [facility]  if equivalent level of care is available:: No  Covid Evaluation: Asymptomatic - no recent exposure (last 10 days) testing not required  Diagnosis: Nausea and vomiting [[id number]]  Admitting Physician: Charlsie Quest [[id number]]  Attending Physician: Charlsie Quest [[id number]]          B Medical/Surgery History Past Medical History:  Diagnosis Date   Allergy    Anemia    "chronic"   Angina    Prinzmetal angina   Anxiety and depression    Arthritis    Asthma    Atrial fibrillation (HCC)    h/o "AF w/frequent PVCs"   Breast cancer (HCC)    Left   Cancer (HCC)    hx of skin cancer    CHF (congestive heart failure) (HCC)    Chronic back pain greater than 3 months duration    on chronic narcotics, treated at pain clinic   Chronic respiratory failure (HCC) 09/15/2015   ONO 09/04/15 + desats >begin O2 at 2l/ m    Colon polyps    hyperplastic   Coronary artery disease    Arrythmia, orthostatic hypotension, HLD, HTN; sees Dr. Jacinto Halim   Difficult intubation    "TMJ & woke up when they were still cutting on me"   Dysrhythmia    sees Dr. 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 Jacinto Halim   OSA (obstructive sleep apnea)    Personal history of chemotherapy    Personal history of radiation therapy    Pneumonia    multiple times   PONV (postoperative nausea and vomiting)    Recurrent upper respiratory  infection (URI)    S/P left TKA 09/25/2016   Shortness of breath 11/20/11   "all the time", sees pulmonlogy, ? asthma   Stenotic cervical os    Stomach ulcer    "3 small; found in 05/2011"   TMJ (dislocation of temporomandibular joint)    Tuberculosis    + TB SKIN TEST   Type 2 diabetes mellitus without complication, without long-term current use of insulin (HCC) 12/06/2015   not on meds    Past Surgical History:  Procedure Laterality Date   ACHILLES TENDON REPAIR  1970's   left ankle   ARTHROSCOPIC REPAIR ACL     left knee cap   BREAST BIOPSY Right 04/08/2013   again in October or November 2020   BREAST LUMPECTOMY Left 12/09/2019   BREAST LUMPECTOMY WITH RADIOACTIVE SEED AND SENTINEL LYMPH NODE BIOPSY Left 12/09/2019   Procedure: LEFT BREAST LUMPECTOMY WITH RADIOACTIVE SEED AND LEFT AXILLARY SENTINEL LYMPH NODE BIOPSY;  Surgeon: Emelia Loron, MD;  Location: [facility] OR;  Service: General;  Laterality: Left;  PEC BLOCK   CARDIAC CATHETERIZATION     loop recorder   CARPAL TUNNEL RELEASE  unknown   left hand   COLONOSCOPY     ESOPHAGOGASTRODUODENOSCOPY (EGD) WITH PROPOFOL N/A 10/29/2017   Procedure: ESOPHAGOGASTRODUODENOSCOPY (EGD) WITH PROPOFOL;  Surgeon: Hilarie Fredrickson, MD;  Location: [facility] ENDOSCOPY;  Service: Endoscopy;  Laterality: N/A;   LOOP RECORDER IMPLANT     PORT-A-CATH REMOVAL N/A 12/09/2019   Procedure: REMOVAL PORT-A-CATH;  Surgeon: Emelia Loron, MD;  Location: [facility] OR;  Service: General;  Laterality: N/A;   PORTACATH PLACEMENT N/A 09/23/2019   Procedure: INSERTION PORT-A-CATH Right Internal Feliberto Harts;  Surgeon: Emelia Loron, MD;  Location: [facility] OR;  Service: General;  Laterality: N/A;   post ganglionectomy  1970's   "for migraine headaches"   pouch string  16,10,96   "did this 3 times (once w/each pregnancy)"   SAVORY DILATION N/A 10/29/2017   Procedure: SAVORY DILATION;  Surgeon: Hilarie Fredrickson, MD;  Location: [facility] ENDOSCOPY;  Service: Endoscopy;   Laterality: N/A;   TOTAL KNEE ARTHROPLASTY Left 09/25/2016   Procedure: LEFT TOTAL KNEE ARTHROPLASTY;  Surgeon: Durene Romans, MD;  Location: [facility] ORS;  Service: Orthopedics;  Laterality: Left;   TUBAL LIGATION  1980's     A IV Location/Drains/Wounds Patient Lines/Drains/Airways Status     Active Line/Drains/Airways     Name Placement date Placement time Site Days   Implanted Port 09/23/19 Right Chest 09/23/19  1326  Chest  1408   Peripheral IV 07/31/23 Anterior;Proximal;Right Antecubital 07/31/23  1600  Antecubital  1  Intake/Output Last 24 hours No intake or output data in the 24 hours ending 08/01/23 0715  Labs/Imaging Results for orders placed or performed during the hospital encounter of 07/31/23 (from the past 48 hour(s))  CBG monitoring, ED     Status: Abnormal   Collection Time: 07/31/23 11:39 AM  Result Value Ref Range   Glucose-Capillary 137 (H) 70 - 99 mg/dL    Comment: Glucose reference range applies only to samples taken after fasting for at least 8 hours.  Lipase, blood     Status: None   Collection Time: 07/31/23 11:54 AM  Result Value Ref Range   Lipase 25 11 - 51 U/L    Comment: Performed at [facility], [address] [place], [place] [address]  Comprehensive metabolic panel     Status: Abnormal   Collection Time: 07/31/23 11:54 AM  Result Value Ref Range   Sodium 141 135 - 145 mmol/L   Potassium 3.5 3.5 - 5.1 mmol/L   Chloride 102 98 - 111 mmol/L   CO2 24 22 - 32 mmol/L   Glucose, Bld 170 (H) 70 - 99 mg/dL    Comment: Glucose reference range applies only to samples taken after fasting for at least 8 hours.   BUN 18 8 - 23 mg/dL   Creatinine, Ser 6.04 0.44 - 1.00 mg/dL   Calcium 9.5 8.9 - 54.0 mg/dL   Total Protein 7.7 6.5 - 8.1 g/dL   Albumin 3.5 3.5 - 5.0 g/dL   AST 26 15 - 41 U/L   ALT 27 0 - 44 U/L   Alkaline Phosphatase 70 38 - 126 U/L   Total Bilirubin 0.7 0.3 - 1.2 mg/dL   GFR, Estimated >98 >11 mL/min    Comment:  (NOTE) Calculated using the CKD-EPI Creatinine Equation (2021)    Anion gap 15 5 - 15    Comment: Performed at [facility], [address], [place], [place] [address]  CBC     Status: None   Collection Time: 07/31/23 11:54 AM  Result Value Ref Range   WBC 8.8 4.0 - 10.5 K/uL   RBC 4.94 3.87 - 5.11 MIL/uL   Hemoglobin 13.9 12.0 - 15.0 g/dL   HCT 29.5 62.1 - 30.8 %   MCV 87.4 80.0 - 100.0 fL   MCH 28.1 26.0 - 34.0 pg   MCHC 32.2 30.0 - 36.0 g/dL   RDW 65.7 84.6 - 96.2 %   Platelets 246 150 - 400 K/uL   nRBC 0.0 0.0 - 0.2 %    Comment: Performed at [facility], [address] [place], [place] [address]  Urinalysis, Routine w reflex microscopic -Urine, Clean Catch     Status: Abnormal   Collection Time: 07/31/23 11:54 AM  Result Value Ref Range   Color, Urine YELLOW YELLOW   APPearance HAZY (A) CLEAR   Specific Gravity, Urine 1.035 (H) 1.005 - 1.030   pH 5.0 5.0 - 8.0   Glucose, UA >=500 (A) NEGATIVE mg/dL   Hgb urine dipstick NEGATIVE NEGATIVE   Bilirubin Urine NEGATIVE NEGATIVE   Ketones, ur 80 (A) NEGATIVE mg/dL   Protein, ur 30 (A) NEGATIVE mg/dL   Nitrite NEGATIVE NEGATIVE   Leukocytes,Ua LARGE (A) NEGATIVE   RBC / HPF 0-5 0 - 5 RBC/hpf   WBC, UA >50 0 - 5 WBC/hpf   Bacteria, UA FEW (A) NONE SEEN   Squamous Epithelial / HPF 0-5 0 - 5 /HPF   Mucus PRESENT  Comment: Performed at [facility], [address], [place], [place] [address]  Troponin I (High Sensitivity)     Status: None   Collection Time: 07/31/23  4:41 PM  Result Value Ref Range   Troponin I (High Sensitivity) 8 <18 ng/L    Comment: (NOTE) Elevated high sensitivity troponin I (hsTnI) values and significant  changes across serial measurements may suggest ACS but many other  chronic and acute conditions are known to elevate hsTnI results.  Refer to the "Links" section for chest pain algorithms and additional  guidance. Performed at [facility], [address], [place],  [place] [address]   Brain natriuretic peptide     Status: Abnormal   Collection Time: 07/31/23  4:41 PM  Result Value Ref Range   B Natriuretic Peptide 100.8 (H) 0.0 - 100.0 pg/mL    Comment: Performed at [facility], [address], [place], [place] [address]  Troponin I (High Sensitivity)     Status: None   Collection Time: 07/31/23  6:22 PM  Result Value Ref Range   Troponin I (High Sensitivity) 9 <18 ng/L    Comment: (NOTE) Elevated high sensitivity troponin I (hsTnI) values and significant  changes across serial measurements may suggest ACS but many other  chronic and acute conditions are known to elevate hsTnI results.  Refer to the "Links" section for chest pain algorithms and additional  guidance. Performed at [facility], [address] [place], [place] [address]   CBG monitoring, ED     Status: Abnormal   Collection Time: 07/31/23 10:24 PM  Result Value Ref Range   Glucose-Capillary 124 (H) 70 - 99 mg/dL    Comment: Glucose reference range applies only to samples taken after fasting for at least 8 hours.  Basic metabolic panel     Status: Abnormal   Collection Time: 08/01/23  3:22 AM  Result Value Ref Range   Sodium 141 135 - 145 mmol/L   Potassium 3.7 3.5 - 5.1 mmol/L   Chloride 106 98 - 111 mmol/L   CO2 23 22 - 32 mmol/L   Glucose, Bld 148 (H) 70 - 99 mg/dL    Comment: Glucose reference range applies only to samples taken after fasting for at least 8 hours.   BUN 20 8 - 23 mg/dL   Creatinine, Ser 5.95 0.44 - 1.00 mg/dL   Calcium 8.9 8.9 - 63.8 mg/dL   GFR, Estimated >75 >64 mL/min    Comment: (NOTE) Calculated using the CKD-EPI Creatinine Equation (2021)    Anion gap 12 5 - 15    Comment: Performed at [facility], [address], [place], [place] [address]  CBC     Status: Abnormal   Collection Time: 08/01/23  3:22 AM  Result Value Ref Range   WBC 9.3 4.0 - 10.5 K/uL   RBC 4.35 3.87 - 5.11 MIL/uL   Hemoglobin 12.3 12.0 - 15.0 g/dL   HCT 18.8  41.6 - 60.6 %   MCV 88.3 80.0 - 100.0 fL   MCH 28.3 26.0 - 34.0 pg   MCHC 32.0 30.0 - 36.0 g/dL   RDW 30.1 (H) 60.1 - 09.3 %   Platelets 217 150 - 400 K/uL   nRBC 0.0 0.0 - 0.2 %    Comment: Performed at [facility], [address] [place], [place] [address]   *Note: Due to a large number of results and/or encounters for the requested time  period, some results have not been displayed. A complete set of results can be found in Results Review.   DG Chest 2 View  Result Date: 07/31/2023 CLINICAL DATA:  Dyspnea. Nausea and vomiting for 3 days. Generalized body aches. Diffuse abdominal pain. EXAM: CHEST - 2 VIEW COMPARISON:  06/12/2022 FINDINGS: Shallow inspiration. Heart size and pulmonary vascularity are normal for technique. Lungs are clear. No pleural effusions. No pneumothorax. Mediastinal contours appear intact. A loop recorder is present. Degenerative changes in the spine and shoulders. Calcification of the aorta. IMPRESSION: No active cardiopulmonary disease. Electronically Signed   By: Burman Nieves M.D.   On: 07/31/2023 19:04   CT CHEST ABDOMEN PELVIS W CONTRAST  Result Date: 07/31/2023 CLINICAL DATA:  Epigastric pain.  Back pain. EXAM: CT CHEST, ABDOMEN, AND PELVIS WITH CONTRAST TECHNIQUE: Multidetector CT imaging of the chest, abdomen and pelvis was performed following the standard protocol during bolus administration of intravenous contrast. RADIATION DOSE REDUCTION: This exam was performed according to the departmental dose-optimization program which includes automated exposure control, adjustment of the mA and/or kV according to patient size and/or use of iterative reconstruction technique. CONTRAST:  75mL OMNIPAQUE IOHEXOL 350 MG/ML SOLN COMPARISON:  CT angio chest 10/30/2019 FINDINGS: CT CHEST FINDINGS Cardiovascular: Heart size is normal. Coronary artery calcifications are present. No significant pericardial effusion is present. Aortic arch and great vessel origins are within  normal limits. Pulmonary arteries are unremarkable. Mediastinum/Nodes: No enlarged mediastinal, hilar, or axillary lymph nodes. Thyroid gland, trachea, and esophagus demonstrate no significant findings. Lungs/Pleura: Lungs are clear. No pleural effusion or pneumothorax. Musculoskeletal: The vertebral body heights and alignment are normal. Alignment is anatomic. Mild rightward curvature is present in the thoracic spine. The ribs are unremarkable. The sternum is intact. Surgical clips are present in the left breast. CT ABDOMEN PELVIS FINDINGS Hepatobiliary: No focal liver abnormality is seen. No gallstones, gallbladder wall thickening, or biliary dilatation. Pancreas: Unremarkable. No pancreatic ductal dilatation or surrounding inflammatory changes. Spleen: Normal in size without focal abnormality. Adrenals/Urinary Tract: Adrenal glands are normal bilaterally. Kidneys are unremarkable. No stone or mass lesion is present. Ureters are normal. No obstruction is present. The urinary bladder is normal, mostly collapsed. Stomach/Bowel: A small hiatal hernia is present. The stomach and duodenum are within normal limits. Small bowel is unremarkable. Terminal ileum is within normal limits. The appendix is not discretely visualized scratched at the appendix is visualized and normal. The ascending and transverse colon are within normal limits. Descending and sigmoid colon are normal. Vascular/Lymphatic: Atherosclerotic calcifications are present in the aorta and branch vessels. No aneurysm is present. Reproductive: Uterus and bilateral adnexa are unremarkable. Other: Fat herniates into the inguinal canals bilaterally without associated bowel. An 8 mm paraumbilical hernia contains fat without bowel. No other significant ventral hernias are present. No free fluid or free air is present. Musculoskeletal: Mild facet hypertrophy is present in the lower lumbar spine. No focal osseous lesions are present. Bony pelvis is normal. Mild  degenerative changes are present in the hips bilaterally, right greater than left. IMPRESSION: 1. No acute or focal lesion to explain the patient's symptoms. 2. Coronary artery disease. 3. Small hiatal hernia. 4. Fat herniates into the inguinal canals bilaterally without associated bowel. 5. Mild degenerative changes in the hips bilaterally, right greater than left. 6.  Aortic Atherosclerosis (ICD10-I70.0). Electronically Signed   By: Marin Roberts M.D.   On: 07/31/2023 18:55    Pending Labs [employer] (From admission, onward)     Start  Ordered   08/01/23 0500  HIV Antibody (routine testing w rflx)  (HIV Antibody (Routine testing w reflex) panel)  Tomorrow morning,   R        07/31/23 2154   07/31/23 1621  Urine Culture  Once,   URGENT       Question:  Indication  Answer:  Dysuria   07/31/23 1620            Vitals/Pain Today's Vitals   08/01/23 0312 08/01/23 0400 08/01/23 0431 08/01/23 0630  BP:  124/76  130/77  Pulse:  85  76  Resp:  18  18  Temp:   98.5 F (36.9 C)   TempSrc:   Axillary   SpO2:  92%  98%  Weight: 97.1 kg     Height: 5\' 5"  (1.651 m)     PainSc:        Isolation Precautions No active isolations  Medications Medications  promethazine (PHENERGAN) 12.5 mg in sodium chloride 0.9 % 50 mL IVPB (0 mg Intravenous Stopped 08/01/23 0504)  cefTRIAXone (ROCEPHIN) 2 g in sodium chloride 0.9 % 100 mL IVPB (has no administration in time range)  enoxaparin (LOVENOX) injection 40 mg (40 mg Subcutaneous Given 07/31/23 2318)  acetaminophen (TYLENOL) tablet 650 mg (has no administration in time range)    Or  acetaminophen (TYLENOL) suppository 650 mg (has no administration in time range)  senna-docusate (Senokot-S) tablet 1 tablet (has no administration in time range)  lactated ringers infusion ( Intravenous New Bag/Given 07/31/23 2317)  insulin aspart (novoLOG) injection 0-9 Units (has no administration in time range)  insulin aspart (novoLOG) injection 0-5  Units ( Subcutaneous Not Given 07/31/23 2237)  albuterol (PROVENTIL) (2.5 MG/3ML) 0.083% nebulizer solution 2.5 mg (has no administration in time range)  aspirin EC tablet 81 mg (has no administration in time range)  atorvastatin (LIPITOR) tablet 80 mg (has no administration in time range)  mometasone-formoterol (DULERA) 100-5 MCG/ACT inhaler 2 puff (0 puffs Inhalation Hold 07/31/23 2319)  buPROPion (WELLBUTRIN XL) 24 hr tablet 150 mg (has no administration in time range)  doxepin (SINEQUAN) capsule 10 mg (has no administration in time range)  isosorbide mononitrate (IMDUR) 24 hr tablet 60 mg (has no administration in time range)  levothyroxine (SYNTHROID) tablet 75 mcg (75 mcg Oral Given 08/01/23 0647)  lisinopril (ZESTRIL) tablet 5 mg (has no administration in time range)  pregabalin (LYRICA) capsule 150 mg (has no administration in time range)  metoprolol succinate (TOPROL-XL) 24 hr tablet 25 mg (has no administration in time range)  montelukast (SINGULAIR) tablet 10 mg (has no administration in time range)  oxyCODONE (Oxy IR/ROXICODONE) immediate release tablet 15 mg (15 mg Oral Given 08/01/23 0107)  ondansetron (ZOFRAN-ODT) disintegrating tablet 4 mg (4 mg Oral Given 07/31/23 1156)  ondansetron (ZOFRAN) injection 4 mg (4 mg Intravenous Given 07/31/23 1636)  lactated ringers bolus 1,000 mL (0 mLs Intravenous Stopped 07/31/23 1732)  prochlorperazine (COMPAZINE) injection 10 mg (10 mg Intravenous Given 07/31/23 1645)  diphenhydrAMINE (BENADRYL) injection 25 mg (25 mg Intravenous Given 07/31/23 1648)  ipratropium-albuterol (DUONEB) 0.5-2.5 (3) MG/3ML nebulizer solution 9 mL (9 mLs Nebulization Given 07/31/23 1718)  cefTRIAXone (ROCEPHIN) 2 g in sodium chloride 0.9 % 100 mL IVPB (0 g Intravenous Stopped 07/31/23 1903)  iohexol (OMNIPAQUE) 350 MG/ML injection 75 mL (75 mLs Intravenous Contrast Given 07/31/23 1827)  promethazine (PHENERGAN) tablet 25 mg (25 mg Oral Given 07/31/23 1926)  predniSONE  (DELTASONE) tablet 60 mg (60 mg Oral Given 07/31/23 1926)  Mobility walks        R Recommendations: See Admitting Provider Note  Report given to:

## 2023-08-01 NOTE — Progress Notes (Signed)
PROGRESS NOTE  Shelby Anderson MWN:[id number] DOB: [birth year] DOA: 07/31/2023 PCP: Karie Georges, MD   LOS: 0 days   Brief Narrative / Interim history: 67 y.o. female with medical history significant for asthma, T2DM, HTN, HLD, hypothyroidism, depression/anxiety, chronic pain on chronic narcotics, OSA on CPAP who presented to the ED for evaluation of nausea and vomiting. Patient reports 3 days of persistent nausea and vomiting and inability maintain any adequate oral intake.  She also reports generalized abdominal pain including suprapubic pain as well as increased urinary frequency.  She has been having alternating episodes of diaphoresis and chills.  Subjective / 24h Interval events: Doing well this morning, still with some intermittent nausea  Assesement and Plan: Principal Problem:   Nausea and vomiting Active Problems:   Acute cystitis   Asthma   OSA (obstructive sleep apnea)   HTN (hypertension), benign   Chronic back pain   Type 2 diabetes mellitus without complication, without long-term current use of insulin (HCC)   Hyperlipidemia associated with type 2 diabetes mellitus (HCC)   Hypothyroidism   Principal problem Intractable nausea vomiting due to UTI -while she reports feeling a little bit better, has tried some clear liquid diet and for now states that if she is scared to try more and would like to take it very slow.  Advance as tolerated. -Continue antiemetic, supportive care, antibiotics, monitor urine cultures  Active problems  Acute cystitis - Continue IV ceftriaxone.  Follow urine culture.   Asthma - Stable on admission.  Continue Symbicort, Singulair, albuterol as needed.   Type 2 diabetes - Hold home meds, placed on SSI.  Lab Results  Component Value Date   HGBA1C 6.9 (H) 01/29/2023   CBG (last 3)  Recent Labs    07/31/23 2224 08/01/23 0812 08/01/23 1217  GLUCAP 124* 110* 80    Hypertension - Continue Imdur, lisinopril, Toprol-XL.    Hypothyroidism - Continue Synthroid.   Hyperlipidemia - Continue atorvastatin.   Chronic pain - Continue home Oxy IR and Lyrica.  OSA - Continue CPAP nightly.  Scheduled Meds:  aspirin EC  81 mg Oral QHS   atorvastatin  80 mg Oral QHS   buPROPion  150 mg Oral Daily   doxepin  10 mg Oral QHS   enoxaparin (LOVENOX) injection  40 mg Subcutaneous Q24H   insulin aspart  0-5 Units Subcutaneous QHS   insulin aspart  0-9 Units Subcutaneous TID WC   isosorbide mononitrate  60 mg Oral Daily   levothyroxine  75 mcg Oral Q0600   lisinopril  5 mg Oral q morning   metoprolol succinate  25 mg Oral Daily   mometasone-formoterol  2 puff Inhalation BID   montelukast  10 mg Oral QHS   pregabalin  150 mg Oral TID   Continuous Infusions:  cefTRIAXone (ROCEPHIN)  IV 2 g (08/01/23 1141)   promethazine (PHENERGAN) injection (IM or IVPB) Stopped (08/01/23 0504)   PRN Meds:.acetaminophen **OR** acetaminophen, albuterol, oxyCODONE, promethazine (PHENERGAN) injection (IM or IVPB), senna-docusate  Current Outpatient Medications  Medication Instructions   albuterol (PROVENTIL) 2.5 mg, Nebulization, Every 6 hours PRN   albuterol (VENTOLIN HFA) 108 (90 Base) MCG/ACT inhaler TAKE 2 PUFFS BY MOUTH EVERY 6 HOURS AS NEEDED FOR WHEEZE OR SHORTNESS OF BREATH   aspirin EC 81 mg, Oral, Daily at bedtime   atorvastatin (LIPITOR) 80 MG tablet TAKE ONE TABLET BY MOUTH EVERYDAY AT BEDTIME Needs appointment for further refills   Blood Glucose Monitoring Suppl (ONE TOUCH ULTRA 2) w/Device  KIT Use as directed   budesonide-formoterol (SYMBICORT) 80-4.5 MCG/ACT inhaler Inhale 2 puffs into the lungs every morning and another 2 puffs 12 hours later.   buPROPion (WELLBUTRIN XL) 150 mg, Oral, Daily   Calcium Carbonate-Vitamin D 600-200 MG-UNIT TABS 1 tablet, Oral, Daily   CINNAMON PO 1,000 mg, Oral, 2 times daily   dapagliflozin propanediol (FARXIGA) 10 mg, Oral, Daily before breakfast   Deplin 7.5 mg, Oral, Daily with  breakfast,     doxepin (SINEQUAN) 10 MG capsule Oral   esomeprazole (NEXIUM) 40 mg, Oral, 2 times daily, Office visit for further refills   famotidine (PEPCID) 20 MG tablet TAKE ONE TABLET BY MOUTH TWICE DAILY FOR INDIGESTION / heartburn   fluticasone (FLONASE) 50 MCG/ACT nasal spray 1 spray, Each Nare, Nightly   furosemide (LASIX) 20 MG tablet 1 tablet daily as needed for ankle swelling   glipiZIDE (GLUCOTROL XL) 5 MG 24 hr tablet TAKE ONE TABLET BY MOUTH EVERY MORNING Needs appointment for further refills   Glucosamine 750 mg, Oral, 2 times daily   glucose blood (ONETOUCH ULTRA) test strip 1 each, Other, 2 times daily   hydrALAZINE (APRESOLINE) 25 mg, Oral, Daily PRN   isosorbide mononitrate (IMDUR) 60 MG 24 hr tablet TAKE ONE TABLET BY MOUTH EVERY MORNING Needs appointment for further refills   Lancets (ONETOUCH DELICA PLUS LANCET30G) MISC USE TO CHECK BLOOD SUGAR 2 TIMES A DAY   levothyroxine (SYNTHROID) 75 mcg, Oral, Daily before breakfast   lisinopril (ZESTRIL) 5 mg, Oral, Every morning   Lyrica 150 mg, Oral, 3 times daily   metFORMIN (GLUCOPHAGE) 500 MG tablet TAKE ONE TABLET BY MOUTH TWICE DAILY WITH A MEAL Needs appointment for further refills   metoprolol succinate (TOPROL-XL) 25 mg, Oral, Daily, TAKE WITH OR IMMEDIATELY FOLLOWING A MEAL.   Misc Natural Products (GLUCOS-CHONDROIT-MSM COMPLEX PO) 2 tablets, Oral, Daily   mometasone (ELOCON) 0.1 % cream Topical   montelukast (SINGULAIR) 10 MG tablet TAKE ONE TABLET BY MOUTH EVERYDAY AT BEDTIME   Multiple Vitamin (MULITIVITAMIN WITH MINERALS) TABS 1 tablet, Oral, Daily,     nitroGLYCERIN (NITROSTAT) 0.4 mg, Sublingual, Every 5 min PRN   NURTEC 75 MG TBDP 1 each, Oral, Daily PRN   ondansetron (ZOFRAN) 4 mg, Oral, Every 8 hours PRN   ONETOUCH ULTRA test strip USE TO CHECK BLOOD SUGAR 2 TIMES A DAY   oxyCODONE (ROXICODONE) 15 MG immediate release tablet Take 1 tablet by mouth four times a day   potassium chloride (KLOR-CON) 10 MEQ  tablet 20 mEq, Oral, Every morning   promethazine (PHENERGAN) 25 MG tablet TAKE ONE TABLET BY MOUTH EVERYDAY AT BEDTIME   tiZANidine (ZANAFLEX) 8 mg, Oral, Every 8 hours PRN   vitamin B-12 (CYANOCOBALAMIN) 500 mcg, Oral, Daily   Vitamin D3 2,000 Units, Oral, Daily   vitamin E 200 Units, Oral, Daily    Diet Orders (From admission, onward)     Start     Ordered   08/01/23 0804  Diet clear liquid Room service appropriate? Yes; Fluid consistency: Thin  Diet effective now       Question Answer Comment  Room service appropriate? Yes   Fluid consistency: Thin      08/01/23 0804            DVT prophylaxis: enoxaparin (LOVENOX) injection 40 mg Start: 07/31/23 2200   Lab Results  Component Value Date   PLT 217 08/01/2023      Code Status: Full Code  Family Communication: husband at bedside  Status is: Observation The patient will require care spanning > 2 midnights and should be moved to inpatient because: persistent symptoms, cannot tolerate good po intake   Level of care: Med-Surg  Consultants:  none  Objective: Vitals:   08/01/23 0630 08/01/23 0715 08/01/23 0806 08/01/23 1126  BP: 130/77 134/79 (!) 142/79 122/77  Pulse: 76 78 88 70  Resp: 18 20 20 16   Temp:  (!) 97.4 F (36.3 C) (!) 97.1 F (36.2 C) 98.4 F (36.9 C)  TempSrc:  Axillary Axillary Oral  SpO2: 98% 96% 100% 100%  Weight:   96.2 kg   Height:   5\' 6"  (1.676 m)    No intake or output data in the 24 hours ending 08/01/23 1300 Wt Readings from Last 3 Encounters:  08/01/23 96.2 kg  05/06/23 97.1 kg  03/25/23 98.4 kg    Examination:  Constitutional: NAD Eyes: no scleral icterus ENMT: Mucous membranes are moist.  Neck: normal, supple Respiratory: clear to auscultation bilaterally, no wheezing, no crackles.  Cardiovascular: Regular rate and rhythm, no murmurs / rubs / gallops. No LE edema.  Abdomen: non distended, no tenderness. Bowel sounds positive.  Musculoskeletal: no clubbing / cyanosis.    Data Reviewed: I have independently reviewed following labs and imaging studies   CBC Recent Labs  Lab 07/31/23 1154 08/01/23 0322  WBC 8.8 9.3  HGB 13.9 12.3  HCT 43.2 38.4  PLT 246 217  MCV 87.4 88.3  MCH 28.1 28.3  MCHC 32.2 32.0  RDW 15.5 15.7*    Recent Labs  Lab 07/31/23 1154 07/31/23 1641 08/01/23 0322  NA 141  --  141  K 3.5  --  3.7  CL 102  --  106  CO2 24  --  23  GLUCOSE 170*  --  148*  BUN 18  --  20  CREATININE 0.96  --  0.84  CALCIUM 9.5  --  8.9  AST 26  --   --   ALT 27  --   --   ALKPHOS 70  --   --   BILITOT 0.7  --   --   ALBUMIN 3.5  --   --   BNP  --  100.8*  --     ------------------------------------------------------------------------------------------------------------------ No results for input(s): "CHOL", "HDL", "LDLCALC", "TRIG", "CHOLHDL", "LDLDIRECT" in the last 72 hours.  Lab Results  Component Value Date   HGBA1C 6.9 (H) 01/29/2023   ------------------------------------------------------------------------------------------------------------------ No results for input(s): "TSH", "T4TOTAL", "T3FREE", "THYROIDAB" in the last 72 hours.  Invalid input(s): "FREET3"  Cardiac Enzymes No results for input(s): "CKMB", "TROPONINI", "MYOGLOBIN" in the last 168 hours.  Invalid input(s): "CK" ------------------------------------------------------------------------------------------------------------------    Component Value Date/Time   BNP 100.8 (H) 07/31/2023 1641    CBG: Recent Labs  Lab 07/31/23 1139 07/31/23 2224 08/01/23 0812 08/01/23 1217  GLUCAP 137* 124* 110* 80    No results found for this or any previous visit (from the past 240 hour(s)).   Radiology Studies: DG Chest 2 View  Result Date: 07/31/2023 CLINICAL DATA:  Dyspnea. Nausea and vomiting for 3 days. Generalized body aches. Diffuse abdominal pain. EXAM: CHEST - 2 VIEW COMPARISON:  06/12/2022 FINDINGS: Shallow inspiration. Heart size and pulmonary  vascularity are normal for technique. Lungs are clear. No pleural effusions. No pneumothorax. Mediastinal contours appear intact. A loop recorder is present. Degenerative changes in the spine and shoulders. Calcification of the aorta. IMPRESSION: No active cardiopulmonary disease. Electronically Signed   By: Marisa Cyphers.D.  On: 07/31/2023 19:04   CT CHEST ABDOMEN PELVIS W CONTRAST  Result Date: 07/31/2023 CLINICAL DATA:  Epigastric pain.  Back pain. EXAM: CT CHEST, ABDOMEN, AND PELVIS WITH CONTRAST TECHNIQUE: Multidetector CT imaging of the chest, abdomen and pelvis was performed following the standard protocol during bolus administration of intravenous contrast. RADIATION DOSE REDUCTION: This exam was performed according to the departmental dose-optimization program which includes automated exposure control, adjustment of the mA and/or kV according to patient size and/or use of iterative reconstruction technique. CONTRAST:  75mL OMNIPAQUE IOHEXOL 350 MG/ML SOLN COMPARISON:  CT angio chest 10/30/2019 FINDINGS: CT CHEST FINDINGS Cardiovascular: Heart size is normal. Coronary artery calcifications are present. No significant pericardial effusion is present. Aortic arch and great vessel origins are within normal limits. Pulmonary arteries are unremarkable. Mediastinum/Nodes: No enlarged mediastinal, hilar, or axillary lymph nodes. Thyroid gland, trachea, and esophagus demonstrate no significant findings. Lungs/Pleura: Lungs are clear. No pleural effusion or pneumothorax. Musculoskeletal: The vertebral body heights and alignment are normal. Alignment is anatomic. Mild rightward curvature is present in the thoracic spine. The ribs are unremarkable. The sternum is intact. Surgical clips are present in the left breast. CT ABDOMEN PELVIS FINDINGS Hepatobiliary: No focal liver abnormality is seen. No gallstones, gallbladder wall thickening, or biliary dilatation. Pancreas: Unremarkable. No pancreatic ductal  dilatation or surrounding inflammatory changes. Spleen: Normal in size without focal abnormality. Adrenals/Urinary Tract: Adrenal glands are normal bilaterally. Kidneys are unremarkable. No stone or mass lesion is present. Ureters are normal. No obstruction is present. The urinary bladder is normal, mostly collapsed. Stomach/Bowel: A small hiatal hernia is present. The stomach and duodenum are within normal limits. Small bowel is unremarkable. Terminal ileum is within normal limits. The appendix is not discretely visualized scratched at the appendix is visualized and normal. The ascending and transverse colon are within normal limits. Descending and sigmoid colon are normal. Vascular/Lymphatic: Atherosclerotic calcifications are present in the aorta and branch vessels. No aneurysm is present. Reproductive: Uterus and bilateral adnexa are unremarkable. Other: Fat herniates into the inguinal canals bilaterally without associated bowel. An 8 mm paraumbilical hernia contains fat without bowel. No other significant ventral hernias are present. No free fluid or free air is present. Musculoskeletal: Mild facet hypertrophy is present in the lower lumbar spine. No focal osseous lesions are present. Bony pelvis is normal. Mild degenerative changes are present in the hips bilaterally, right greater than left. IMPRESSION: 1. No acute or focal lesion to explain the patient's symptoms. 2. Coronary artery disease. 3. Small hiatal hernia. 4. Fat herniates into the inguinal canals bilaterally without associated bowel. 5. Mild degenerative changes in the hips bilaterally, right greater than left. 6.  Aortic Atherosclerosis (ICD10-I70.0). Electronically Signed   By: Marin Roberts M.D.   On: 07/31/2023 18:55     Pamella Pert, MD, PhD [facility]  Between 7 am - 7 pm I am available, please contact me via Amion (for emergencies) or Securechat (non urgent messages)  Between 7 pm - 7 am I am not available, please  contact night coverage MD/APP via Amion

## 2023-08-02 ENCOUNTER — Other Ambulatory Visit: Payer: Self-pay | Admitting: Family Medicine

## 2023-08-02 ENCOUNTER — Other Ambulatory Visit: Payer: Self-pay | Admitting: Cardiology

## 2023-08-02 DIAGNOSIS — R112 Nausea with vomiting, unspecified: Secondary | ICD-10-CM | POA: Diagnosis not present

## 2023-08-02 DIAGNOSIS — E039 Hypothyroidism, unspecified: Secondary | ICD-10-CM

## 2023-08-02 LAB — GLUCOSE, CAPILLARY
Glucose-Capillary: 103 mg/dL — ABNORMAL HIGH (ref 70–99)
Glucose-Capillary: 104 mg/dL — ABNORMAL HIGH (ref 70–99)
Glucose-Capillary: 160 mg/dL — ABNORMAL HIGH (ref 70–99)
Glucose-Capillary: 185 mg/dL — ABNORMAL HIGH (ref 70–99)
Glucose-Capillary: 81 mg/dL (ref 70–99)

## 2023-08-02 LAB — URINE CULTURE

## 2023-08-02 NOTE — Plan of Care (Signed)

## 2023-08-02 NOTE — Progress Notes (Signed)
PROGRESS NOTE  Shelby Anderson ZOX:[id number] DOB: 1956/03/09 DOA: 07/31/2023 PCP: Karie Georges, MD   LOS: 1 day   Brief Narrative / Interim history: 67 y.o. female with medical history significant for asthma, T2DM, HTN, HLD, hypothyroidism, depression/anxiety, chronic pain on chronic narcotics, OSA on CPAP who presented to the ED for evaluation of nausea and vomiting. Patient reports 3 days of persistent nausea and vomiting and inability maintain any adequate oral intake.  She also reports generalized abdominal pain including suprapubic pain as well as increased urinary frequency.  She has been having alternating episodes of diaphoresis and chills.  Subjective / 24h Interval events: Nausea this morning, but improved after antiemetics  Assesement and Plan: Principal Problem:   Nausea and vomiting Active Problems:   Acute cystitis   Asthma   OSA (obstructive sleep apnea)   HTN (hypertension), benign   Chronic back pain   Type 2 diabetes mellitus without complication, without long-term current use of insulin (HCC)   Hyperlipidemia associated with type 2 diabetes mellitus (HCC)   Hypothyroidism   UTI (urinary tract infection)   Principal problem Intractable nausea vomiting due to UTI -  Advance to regular today as overall she is better -Continue antiemetic, supportive care, antibiotics, urine cultures unfortunately did not identify a predominant species.  Will convert to orals tomorrow anticipating home discharge  Active problems  Acute cystitis - Continue IV ceftriaxone for now   Asthma - Stable on admission.  Continue Symbicort, Singulair, albuterol as needed.   Type 2 diabetes - Hold home meds, placed on SSI.  Lab Results  Component Value Date   HGBA1C 6.9 (H) 01/29/2023   CBG (last 3)  Recent Labs    08/02/23 0004 08/02/23 0737 08/02/23 1327  GLUCAP 81 103* 185*    Hypertension - Continue Imdur, lisinopril, Toprol-XL.   Hypothyroidism - Continue  Synthroid.   Hyperlipidemia - Continue atorvastatin.   Chronic pain - Continue home Oxy IR and Lyrica.  OSA - Continue CPAP nightly.  Scheduled Meds:  aspirin EC  81 mg Oral QHS   atorvastatin  80 mg Oral QHS   buPROPion  150 mg Oral Daily   doxepin  10 mg Oral QHS   enoxaparin (LOVENOX) injection  40 mg Subcutaneous Q24H   insulin aspart  0-5 Units Subcutaneous QHS   insulin aspart  0-9 Units Subcutaneous TID WC   isosorbide mononitrate  60 mg Oral Daily   levothyroxine  75 mcg Oral Q0600   lisinopril  5 mg Oral q morning   metoprolol succinate  25 mg Oral Daily   mometasone-formoterol  2 puff Inhalation BID   montelukast  10 mg Oral QHS   pregabalin  150 mg Oral TID   Continuous Infusions:  cefTRIAXone (ROCEPHIN)  IV 2 g (08/02/23 0943)   promethazine (PHENERGAN) injection (IM or IVPB) 12.5 mg (08/02/23 0837)   PRN Meds:.acetaminophen **OR** acetaminophen, albuterol, oxyCODONE, promethazine (PHENERGAN) injection (IM or IVPB), senna-docusate  Current Outpatient Medications  Medication Instructions   albuterol (PROVENTIL) 2.5 mg, Nebulization, Every 6 hours PRN   albuterol (VENTOLIN HFA) 108 (90 Base) MCG/ACT inhaler TAKE 2 PUFFS BY MOUTH EVERY 6 HOURS AS NEEDED FOR WHEEZE OR SHORTNESS OF BREATH   aspirin EC 81 mg, Oral, Daily at bedtime   atorvastatin (LIPITOR) 80 MG tablet TAKE ONE TABLET BY MOUTH EVERYDAY AT BEDTIME Needs appointment for further refills   Blood Glucose Monitoring Suppl (ONE TOUCH ULTRA 2) w/Device KIT Use as directed   budesonide-formoterol (SYMBICORT) 80-4.5  MCG/ACT inhaler Inhale 2 puffs into the lungs every morning and another 2 puffs 12 hours later.   buPROPion (WELLBUTRIN XL) 150 mg, Oral, Daily   Calcium Carbonate-Vitamin D 600-200 MG-UNIT TABS 1 tablet, Oral, Daily   CINNAMON PO 1,000 mg, Oral, 2 times daily   dapagliflozin propanediol (FARXIGA) 10 mg, Oral, Daily before breakfast   Deplin 7.5 mg, Oral, Daily with breakfast,     doxepin (SINEQUAN)  10 MG capsule Oral   esomeprazole (NEXIUM) 40 mg, Oral, 2 times daily, Office visit for further refills   famotidine (PEPCID) 20 MG tablet TAKE ONE TABLET BY MOUTH TWICE DAILY FOR INDIGESTION / heartburn   fluticasone (FLONASE) 50 MCG/ACT nasal spray 1 spray, Each Nare, Nightly   furosemide (LASIX) 20 MG tablet 1 tablet daily as needed for ankle swelling   glipiZIDE (GLUCOTROL XL) 5 MG 24 hr tablet TAKE ONE TABLET BY MOUTH EVERY MORNING Needs appointment for further refills   Glucosamine 750 mg, Oral, 2 times daily   glucose blood (ONETOUCH ULTRA) test strip 1 each, Other, 2 times daily   hydrALAZINE (APRESOLINE) 25 MG tablet TAKE 1 TABLET BY MOUTH DAILY AS NEEDED ( FOR BLOOD PRESSURE >140/90MMHG UP TO THREE TIMES A DAY )   isosorbide mononitrate (IMDUR) 60 MG 24 hr tablet TAKE ONE TABLET BY MOUTH EVERY MORNING Needs appointment for further refills   Lancets (ONETOUCH DELICA PLUS LANCET30G) MISC USE TO CHECK BLOOD SUGAR 2 TIMES A DAY   levothyroxine (SYNTHROID) 75 mcg, Oral, Daily before breakfast   lisinopril (ZESTRIL) 5 mg, Oral, Every morning   Lyrica 150 mg, Oral, 3 times daily   metFORMIN (GLUCOPHAGE) 500 MG tablet TAKE ONE TABLET BY MOUTH TWICE DAILY WITH A MEAL Needs appointment for further refills   metoprolol succinate (TOPROL-XL) 25 mg, Oral, Daily, TAKE WITH OR IMMEDIATELY FOLLOWING A MEAL.   Misc Natural Products (GLUCOS-CHONDROIT-MSM COMPLEX PO) 2 tablets, Oral, Daily   mometasone (ELOCON) 0.1 % cream Topical   montelukast (SINGULAIR) 10 MG tablet TAKE ONE TABLET BY MOUTH EVERYDAY AT BEDTIME   Multiple Vitamin (MULITIVITAMIN WITH MINERALS) TABS 1 tablet, Oral, Daily,     nitroGLYCERIN (NITROSTAT) 0.4 mg, Sublingual, Every 5 min PRN   NURTEC 75 MG TBDP 1 each, Oral, Daily PRN   ondansetron (ZOFRAN) 4 mg, Oral, Every 8 hours PRN   ONETOUCH ULTRA test strip USE TO CHECK BLOOD SUGAR 2 TIMES A DAY   oxyCODONE (ROXICODONE) 15 MG immediate release tablet Take 1 tablet by mouth four  times a day   potassium chloride (KLOR-CON) 10 MEQ tablet 20 mEq, Oral, Every morning   promethazine (PHENERGAN) 25 MG tablet TAKE ONE TABLET BY MOUTH EVERYDAY AT BEDTIME   tiZANidine (ZANAFLEX) 8 mg, Oral, Every 8 hours PRN   vitamin B-12 (CYANOCOBALAMIN) 500 mcg, Oral, Daily   Vitamin D3 2,000 Units, Oral, Daily   vitamin E 200 Units, Oral, Daily    Diet Orders (From admission, onward)     Start     Ordered   08/02/23 1346  Diet regular Room service appropriate? Yes; Fluid consistency: Thin  Diet effective now       Question Answer Comment  Room service appropriate? Yes   Fluid consistency: Thin      08/02/23 1345            DVT prophylaxis: enoxaparin (LOVENOX) injection 40 mg Start: 07/31/23 2200   Lab Results  Component Value Date   PLT 217 08/01/2023      Code Status:  Full Code  Family Communication: husband at bedside   Status is: Inpatient   Level of care: Med-Surg  Consultants:  none  Objective: Vitals:   08/02/23 0415 08/02/23 0736 08/02/23 0834 08/02/23 1338  BP: 105/72 129/71 133/72 101/60  Pulse: (!) 55 63 63 76  Resp: 17 18    Temp: 97.7 F (36.5 C) 97.7 F (36.5 C)  98.2 F (36.8 C)  TempSrc: Oral   Oral  SpO2: 99% 100% 98% 97%  Weight:      Height:        Intake/Output Summary (Last 24 hours) at 08/02/2023 1411 Last data filed at 08/02/2023 1049 Gross per 24 hour  Intake 593.55 ml  Output 350 ml  Net 243.55 ml   Wt Readings from Last 3 Encounters:  08/01/23 96.2 kg  05/06/23 97.1 kg  03/25/23 98.4 kg    Examination:  Constitutional: NAD Eyes: lids and conjunctivae normal, no scleral icterus ENMT: mmm Neck: normal, supple Respiratory: clear to auscultation bilaterally, no wheezing, no crackles.  Cardiovascular: Regular rate and rhythm, no murmurs / rubs / gallops. No LE edema. Abdomen: soft, no distention, no tenderness. Bowel sounds positive.   Data Reviewed: I have independently reviewed following labs and imaging  studies   CBC Recent Labs  Lab 07/31/23 1154 08/01/23 0322  WBC 8.8 9.3  HGB 13.9 12.3  HCT 43.2 38.4  PLT 246 217  MCV 87.4 88.3  MCH 28.1 28.3  MCHC 32.2 32.0  RDW 15.5 15.7*    Recent Labs  Lab 07/31/23 1154 07/31/23 1641 08/01/23 0322  NA 141  --  141  K 3.5  --  3.7  CL 102  --  106  CO2 24  --  23  GLUCOSE 170*  --  148*  BUN 18  --  20  CREATININE 0.96  --  0.84  CALCIUM 9.5  --  8.9  AST 26  --   --   ALT 27  --   --   ALKPHOS 70  --   --   BILITOT 0.7  --   --   ALBUMIN 3.5  --   --   BNP  --  100.8*  --     ------------------------------------------------------------------------------------------------------------------ No results for input(s): "CHOL", "HDL", "LDLCALC", "TRIG", "CHOLHDL", "LDLDIRECT" in the last 72 hours.  Lab Results  Component Value Date   HGBA1C 6.9 (H) 01/29/2023   ------------------------------------------------------------------------------------------------------------------ No results for input(s): "TSH", "T4TOTAL", "T3FREE", "THYROIDAB" in the last 72 hours.  Invalid input(s): "FREET3"  Cardiac Enzymes No results for input(s): "CKMB", "TROPONINI", "MYOGLOBIN" in the last 168 hours.  Invalid input(s): "CK" ------------------------------------------------------------------------------------------------------------------    Component Value Date/Time   BNP 100.8 (H) 07/31/2023 1641    CBG: Recent Labs  Lab 08/01/23 1617 08/01/23 2057 08/02/23 0004 08/02/23 0737 08/02/23 1327  GLUCAP 103* 122* 81 103* 185*    Recent Results (from the past 240 hour(s))  Urine Culture     Status: Abnormal   Collection Time: 07/31/23 11:54 AM   Specimen: Urine, Clean Catch  Result Value Ref Range Status   Specimen Description URINE, CLEAN CATCH  Final   Special Requests   Final    NONE Performed at [facility], [address], [place], [place] [address]    Culture MULTIPLE SPECIES PRESENT, SUGGEST RECOLLECTION (A)   Final   Report Status 08/02/2023 FINAL  Final     Radiology Studies: No results found.   Pamella Pert, MD, PhD [facility]  Between 7 am - 7 pm I am available, please contact me via Amion (for emergencies) or Securechat (non urgent messages)  Between 7 pm - 7 am I am not available, please contact night coverage MD/APP via Amion

## 2023-08-02 NOTE — Progress Notes (Signed)
   08/02/23 0039  BiPAP/CPAP/SIPAP  $ Non-Invasive Ventilator  Non-Invasive Vent Set Up  $ Face Mask Medium Yes  BiPAP/CPAP/SIPAP Pt Type Adult  BiPAP/CPAP/SIPAP Resmed  Mask Type Full face mask  Mask Size Medium  Patient Home Equipment No  Auto Titrate Yes (low 5 high 14)  CPAP/SIPAP surface wiped down Yes   Pt resting comfortably on bipap.  RT wil lcontinue to monitor

## 2023-08-03 ENCOUNTER — Other Ambulatory Visit (HOSPITAL_COMMUNITY): Payer: Self-pay

## 2023-08-03 DIAGNOSIS — R112 Nausea with vomiting, unspecified: Secondary | ICD-10-CM | POA: Diagnosis not present

## 2023-08-03 LAB — GLUCOSE, CAPILLARY
Glucose-Capillary: 136 mg/dL — ABNORMAL HIGH (ref 70–99)
Glucose-Capillary: 161 mg/dL — ABNORMAL HIGH (ref 70–99)

## 2023-08-03 MED ORDER — CEFADROXIL 500 MG PO CAPS: 500.0000 mg | ORAL_CAPSULE | Freq: Two times a day (BID) | ORAL | Status: DC

## 2023-08-03 MED ORDER — CEFADROXIL 500 MG PO CAPS
500.0000 mg | ORAL_CAPSULE | Freq: Two times a day (BID) | ORAL | 0 refills | Status: DC
Start: 2023-08-03 — End: 2023-08-08
  Filled 2023-08-03: qty 10, 5d supply, fill #0

## 2023-08-03 NOTE — Discharge Summary (Signed)
Physician Discharge Summary  Shelby Anderson XBM:[id number] DOB: 02-02-1956 DOA: 07/31/2023  PCP: Karie Georges, MD  Admit date: 07/31/2023 Discharge date: 08/03/2023  Admitted From: home Disposition:  home  Recommendations for Outpatient Follow-up:  Follow up with PCP in 1-2 weeks  Home Health: none Equipment/Devices: none  Discharge Condition: stable CODE STATUS: Full code Diet Orders (From admission, onward)     Start     Ordered   08/02/23 1346  Diet regular Room service appropriate? Yes; Fluid consistency: Thin  Diet effective now       Question Answer Comment  Room service appropriate? Yes   Fluid consistency: Thin      08/02/23 1345            HPI: Per admitting MD, Shelby Anderson is a 67 y.o. female with medical history significant for asthma, T2DM, HTN, HLD, hypothyroidism, depression/anxiety, chronic pain on chronic narcotics, OSA on CPAP who presented to the ED for evaluation of nausea and vomiting. Patient reports 3 days of persistent nausea and vomiting and inability maintain any adequate oral intake.  She also reports generalized abdominal pain including suprapubic pain as well as increased urinary frequency.  She has been having alternating episodes of diaphoresis and chills.  Hospital Course / Discharge diagnoses: Principal Problem:   Nausea and vomiting Active Problems:   Acute cystitis   Asthma   OSA (obstructive sleep apnea)   HTN (hypertension), benign   Chronic back pain   Type 2 diabetes mellitus without complication, without long-term current use of insulin (HCC)   Hyperlipidemia associated with type 2 diabetes mellitus (HCC)   Hypothyroidism   UTI (urinary tract infection)   Principal problem Intractable nausea vomiting due to UTI -patient was admitted to the hospital with urinary symptoms as well as nausea and vomiting.  She was initially made n.p.o., placed on fluids and ceftriaxone.  She improved with antibiotics, diet  was advanced to regular, she is tolerating eating without further nausea or vomiting.  Urine cultures were sent and unfortunately did not speciate a predominant bacteria, and will be transition to cefadroxil for 5 additional days upon discharge.  Active problems  Acute cystitis - as above  Asthma -continue home medications Type 2 diabetes -continue home medications Hypertension - Continue Imdur, lisinopril, Toprol-XL. Hypothyroidism - Continue Synthroid. Hyperlipidemia - Continue atorvastatin. Chronic pain - Continue home Oxy IR and Lyrica. OSA - Continue CPAP nightly.  Sepsis ruled out   Discharge Instructions   Allergies as of 08/03/2023       Reactions   Lodine [etodolac] Anaphylaxis, Hives, Swelling   Oxycontin [oxycodone Hcl] Anaphylaxis   hives, trouble breathing, tongue swelling (Only Oxycontin) Tolerates plain oxycodone.   Penicillins Anaphylaxis   Told by a surgeon never to take it again. Has patient had a PCN reaction causing immediate rash, facial/tongue/throat swelling, SOB or lightheadedness with hypotension: Yes Has patient had a PCN reaction causing severe rash involving mucus membranes or skin necrosis: Unknown Has patient had a PCN reaction that required hospitalization: No Has patient had a PCN reaction occurring within the last 10 years: No If all of the above answers are "NO", then may proceed with Cephalosporin use.   Aspirin Other (See Comments)   High-dose caused GI Bleeds   Darvocet [propoxyphene N-acetaminophen] Hives   Nitroglycerin Other (See Comments)   IV-BP drops dramatically Can take po   Propoxyphene Hives   Tramadol Hives, Itching   Valium Other (See Comments)   Circulation problems. "Legs turned  black".        Medication List     TAKE these medications    albuterol (2.5 MG/3ML) 0.083% nebulizer solution Commonly known as: PROVENTIL Take 3 mLs (2.5 mg total) by nebulization every 6 (six) hours as needed for wheezing or shortness of  breath (J45.40).   albuterol 108 (90 Base) MCG/ACT inhaler Commonly known as: VENTOLIN HFA TAKE 2 PUFFS BY MOUTH EVERY 6 HOURS AS NEEDED FOR WHEEZE OR SHORTNESS OF BREATH   aspirin EC 81 MG tablet Take 81 mg at bedtime by mouth.   atorvastatin 80 MG tablet Commonly known as: LIPITOR TAKE ONE TABLET BY MOUTH EVERYDAY AT BEDTIME Needs appointment for further refills   budesonide-formoterol 80-4.5 MCG/ACT inhaler Commonly known as: Symbicort Inhale 2 puffs into the lungs every morning and another 2 puffs 12 hours later.   buPROPion 150 MG 24 hr tablet Commonly known as: WELLBUTRIN XL Take 1 tablet (150 mg total) by mouth daily.   Calcium Carbonate-Vitamin D 600-200 MG-UNIT Tabs Take 1 tablet by mouth daily.   cefadroxil 500 MG capsule Commonly known as: DURICEF Take 1 capsule (500 mg total) by mouth 2 (two) times daily for 5 days.   CINNAMON PO Take 1,000 mg 2 (two) times daily by mouth.   dapagliflozin propanediol 10 MG Tabs tablet Commonly known as: Farxiga Take 1 tablet (10 mg total) by mouth daily before breakfast.   Deplin 7.5 MG Tabs Take 7.5 mg by mouth daily with breakfast.   doxepin 10 MG capsule Commonly known as: SINEQUAN Take by mouth.   esomeprazole 40 MG capsule Commonly known as: NexIUM Take 1 capsule (40 mg total) by mouth 2 (two) times daily. Office visit for further refills   famotidine 20 MG tablet Commonly known as: PEPCID TAKE ONE TABLET BY MOUTH TWICE DAILY FOR INDIGESTION / heartburn   fluticasone 50 MCG/ACT nasal spray Commonly known as: FLONASE Place 1 spray into both nostrils at bedtime.   furosemide 20 MG tablet Commonly known as: LASIX 1 tablet daily as needed for ankle swelling   glipiZIDE 5 MG 24 hr tablet Commonly known as: GLUCOTROL XL TAKE ONE TABLET BY MOUTH EVERY MORNING Needs appointment for further refills   GLUCOS-CHONDROIT-MSM COMPLEX PO Take 2 tablets by mouth daily.   Glucosamine 750 MG Tabs Take 750 mg by mouth 2  (two) times daily.   hydrALAZINE 25 MG tablet Commonly known as: APRESOLINE TAKE 1 TABLET BY MOUTH DAILY AS NEEDED ( FOR BLOOD PRESSURE >140/90MMHG UP TO THREE TIMES A DAY ) What changed: See the new instructions.   isosorbide mononitrate 60 MG 24 hr tablet Commonly known as: IMDUR TAKE ONE TABLET BY MOUTH EVERY MORNING Needs appointment for further refills   levothyroxine 75 MCG tablet Commonly known as: Synthroid Take 1 tablet (75 mcg total) by mouth daily before breakfast.   lisinopril 5 MG tablet Commonly known as: ZESTRIL TAKE ONE TABLET BY MOUTH EVERY MORNING   Lyrica 150 MG capsule Generic drug: pregabalin Take 150 mg by mouth 3 (three) times daily.   metFORMIN 500 MG tablet Commonly known as: GLUCOPHAGE TAKE ONE TABLET BY MOUTH TWICE DAILY WITH A MEAL Needs appointment for further refills   metoprolol succinate 25 MG 24 hr tablet Commonly known as: TOPROL-XL Take 1 tablet (25 mg total) by mouth daily. TAKE WITH OR IMMEDIATELY FOLLOWING A MEAL.   mometasone 0.1 % cream Commonly known as: ELOCON Apply topically.   montelukast 10 MG tablet Commonly known as: SINGULAIR TAKE ONE TABLET  BY MOUTH EVERYDAY AT BEDTIME   multivitamin with minerals Tabs tablet Take 1 tablet by mouth daily.   nitroGLYCERIN 0.4 MG SL tablet Commonly known as: NITROSTAT Place 1 tablet (0.4 mg total) under the tongue every 5 (five) minutes as needed for chest pain.   Nurtec 75 MG Tbdp Generic drug: Rimegepant Sulfate Take 1 each by mouth daily as needed.   ondansetron 4 MG tablet Commonly known as: ZOFRAN Take 1 tablet (4 mg total) by mouth every 8 (eight) hours as needed for nausea or vomiting.   ONE TOUCH ULTRA 2 w/Device Kit Use as directed   OneTouch Delica Plus Lancet30G Misc USE TO CHECK BLOOD SUGAR 2 TIMES A DAY   OneTouch Ultra test strip Generic drug: glucose blood USE TO CHECK BLOOD SUGAR 2 TIMES A DAY   OneTouch Ultra test strip Generic drug: glucose blood 1 each  by Other route 2 (two) times daily.   oxyCODONE 15 MG immediate release tablet Commonly known as: ROXICODONE Take 1 tablet by mouth four times a day   potassium chloride 10 MEQ tablet Commonly known as: KLOR-CON TAKE TWO TABLETS BY MOUTH EVERY MORNING   promethazine 25 MG tablet Commonly known as: PHENERGAN TAKE ONE TABLET BY MOUTH EVERYDAY AT BEDTIME   tiZANidine 4 MG tablet Commonly known as: ZANAFLEX Take 8 mg by mouth every 8 (eight) hours as needed for muscle spasms.   vitamin B-12 500 MCG tablet Commonly known as: CYANOCOBALAMIN Take 500 mcg by mouth daily.   Vitamin D3 50 MCG (2000 UT) capsule Take 2,000 Units by mouth daily.   vitamin E 200 UNIT capsule Take 200 Units by mouth daily.         Consultations: none  Procedures/Studies:  DG Chest 2 View  Result Date: 07/31/2023 CLINICAL DATA:  Dyspnea. Nausea and vomiting for 3 days. Generalized body aches. Diffuse abdominal pain. EXAM: CHEST - 2 VIEW COMPARISON:  06/12/2022 FINDINGS: Shallow inspiration. Heart size and pulmonary vascularity are normal for technique. Lungs are clear. No pleural effusions. No pneumothorax. Mediastinal contours appear intact. A loop recorder is present. Degenerative changes in the spine and shoulders. Calcification of the aorta. IMPRESSION: No active cardiopulmonary disease. Electronically Signed   By: Burman Nieves M.D.   On: 07/31/2023 19:04   CT CHEST ABDOMEN PELVIS W CONTRAST  Result Date: 07/31/2023 CLINICAL DATA:  Epigastric pain.  Back pain. EXAM: CT CHEST, ABDOMEN, AND PELVIS WITH CONTRAST TECHNIQUE: Multidetector CT imaging of the chest, abdomen and pelvis was performed following the standard protocol during bolus administration of intravenous contrast. RADIATION DOSE REDUCTION: This exam was performed according to the departmental dose-optimization program which includes automated exposure control, adjustment of the mA and/or kV according to patient size and/or use of  iterative reconstruction technique. CONTRAST:  75mL OMNIPAQUE IOHEXOL 350 MG/ML SOLN COMPARISON:  CT angio chest 10/30/2019 FINDINGS: CT CHEST FINDINGS Cardiovascular: Heart size is normal. Coronary artery calcifications are present. No significant pericardial effusion is present. Aortic arch and great vessel origins are within normal limits. Pulmonary arteries are unremarkable. Mediastinum/Nodes: No enlarged mediastinal, hilar, or axillary lymph nodes. Thyroid gland, trachea, and esophagus demonstrate no significant findings. Lungs/Pleura: Lungs are clear. No pleural effusion or pneumothorax. Musculoskeletal: The vertebral body heights and alignment are normal. Alignment is anatomic. Mild rightward curvature is present in the thoracic spine. The ribs are unremarkable. The sternum is intact. Surgical clips are present in the left breast. CT ABDOMEN PELVIS FINDINGS Hepatobiliary: No focal liver abnormality is seen. No gallstones, gallbladder  wall thickening, or biliary dilatation. Pancreas: Unremarkable. No pancreatic ductal dilatation or surrounding inflammatory changes. Spleen: Normal in size without focal abnormality. Adrenals/Urinary Tract: Adrenal glands are normal bilaterally. Kidneys are unremarkable. No stone or mass lesion is present. Ureters are normal. No obstruction is present. The urinary bladder is normal, mostly collapsed. Stomach/Bowel: A small hiatal hernia is present. The stomach and duodenum are within normal limits. Small bowel is unremarkable. Terminal ileum is within normal limits. The appendix is not discretely visualized scratched at the appendix is visualized and normal. The ascending and transverse colon are within normal limits. Descending and sigmoid colon are normal. Vascular/Lymphatic: Atherosclerotic calcifications are present in the aorta and branch vessels. No aneurysm is present. Reproductive: Uterus and bilateral adnexa are unremarkable. Other: Fat herniates into the inguinal canals  bilaterally without associated bowel. An 8 mm paraumbilical hernia contains fat without bowel. No other significant ventral hernias are present. No free fluid or free air is present. Musculoskeletal: Mild facet hypertrophy is present in the lower lumbar spine. No focal osseous lesions are present. Bony pelvis is normal. Mild degenerative changes are present in the hips bilaterally, right greater than left. IMPRESSION: 1. No acute or focal lesion to explain the patient's symptoms. 2. Coronary artery disease. 3. Small hiatal hernia. 4. Fat herniates into the inguinal canals bilaterally without associated bowel. 5. Mild degenerative changes in the hips bilaterally, right greater than left. 6.  Aortic Atherosclerosis (ICD10-I70.0). Electronically Signed   By: Marin Roberts M.D.   On: 07/31/2023 18:55   DG Foot Complete Left  Result Date: 07/18/2023 Please see detailed radiograph report in office note.  DG Foot Complete Right  Result Date: 07/18/2023 Please see detailed radiograph report in office note.    Subjective: - no chest pain, shortness of breath, no abdominal pain, nausea or vomiting.   Discharge Exam: BP 123/85 (BP Location: Left Arm)   Pulse 68   Temp 97.6 F (36.4 C) (Oral)   Resp 18   Ht 5\' 6"  (1.676 m)   Wt 96.2 kg   SpO2 95%   BMI 34.23 kg/m   General: Pt is alert, awake, not in acute distress Cardiovascular: RRR, S1/S2 +, no rubs, no gallops Respiratory: CTA bilaterally, no wheezing, no rhonchi Abdominal: Soft, NT, ND, bowel sounds + Extremities: no edema, no cyanosis    The results of significant diagnostics from this hospitalization (including imaging, microbiology, ancillary and laboratory) are listed below for reference.     Microbiology: Recent Results (from the past 240 hour(s))  Urine Culture     Status: Abnormal   Collection Time: 07/31/23 11:54 AM   Specimen: Urine, Clean Catch  Result Value Ref Range Status   Specimen Description URINE, CLEAN  CATCH  Final   Special Requests   Final    NONE Performed at [facility], [address], [place], [place] [address]    Culture MULTIPLE SPECIES PRESENT, SUGGEST RECOLLECTION (A)  Final   Report Status 08/02/2023 FINAL  Final     Labs: Basic Metabolic Panel: Recent Labs  Lab 07/31/23 1154 08/01/23 0322  NA 141 141  K 3.5 3.7  CL 102 106  CO2 24 23  GLUCOSE 170* 148*  BUN 18 20  CREATININE 0.96 0.84  CALCIUM 9.5 8.9   Liver Function Tests: Recent Labs  Lab 07/31/23 1154  AST 26  ALT 27  ALKPHOS 70  BILITOT 0.7  PROT 7.7  ALBUMIN 3.5   CBC: Recent Labs  Lab 07/31/23 1154  08/01/23 0322  WBC 8.8 9.3  HGB 13.9 12.3  HCT 43.2 38.4  MCV 87.4 88.3  PLT 246 217   CBG: Recent Labs  Lab 08/02/23 0737 08/02/23 1327 08/02/23 1707 08/02/23 2011 08/03/23 0744  GLUCAP 103* 185* 104* 160* 136*   Hgb A1c No results for input(s): "HGBA1C" in the last 72 hours. Lipid Profile No results for input(s): "CHOL", "HDL", "LDLCALC", "TRIG", "CHOLHDL", "LDLDIRECT" in the last 72 hours. Thyroid function studies No results for input(s): "TSH", "T4TOTAL", "T3FREE", "THYROIDAB" in the last 72 hours.  Invalid input(s): "FREET3" Urinalysis    Component Value Date/Time   COLORURINE YELLOW 07/31/2023 1154   APPEARANCEUR HAZY (A) 07/31/2023 1154   LABSPEC 1.035 (H) 07/31/2023 1154   PHURINE 5.0 07/31/2023 1154   GLUCOSEU >=500 (A) 07/31/2023 1154   GLUCOSEU NEGATIVE 01/05/2013 1535   HGBUR NEGATIVE 07/31/2023 1154   BILIRUBINUR NEGATIVE 07/31/2023 1154   BILIRUBINUR n 10/27/2014 1118   KETONESUR 80 (A) 07/31/2023 1154   PROTEINUR 30 (A) 07/31/2023 1154   UROBILINOGEN 0.2 10/27/2014 1118   UROBILINOGEN 1.0 12/23/2013 1344   NITRITE NEGATIVE 07/31/2023 1154   LEUKOCYTESUR LARGE (A) 07/31/2023 1154    FURTHER DISCHARGE INSTRUCTIONS:   Get Medicines reviewed and adjusted: Please take all your medications with you for your next visit with your Primary MD    Laboratory/radiological data: Please request your Primary MD to go over all hospital tests and procedure/radiological results at the follow up, please ask your Primary MD to get all Hospital records sent to his/her office.   In some cases, they will be blood work, cultures and biopsy results pending at the time of your discharge. Please request that your primary care M.D. goes through all the records of your hospital data and follows up on these results.   Also Note the following: If you experience worsening of your admission symptoms, develop shortness of breath, life threatening emergency, suicidal or homicidal thoughts you must seek medical attention immediately by calling 911 or calling your MD immediately  if symptoms less severe.   You must read complete instructions/literature along with all the possible adverse reactions/side effects for all the Medicines you take and that have been prescribed to you. Take any new Medicines after you have completely understood and accpet all the possible adverse reactions/side effects.    Do not drive when taking Pain medications or sleeping medications (Benzodaizepines)   Do not take more than prescribed Pain, Sleep and Anxiety Medications. It is not advisable to combine anxiety,sleep and pain medications without talking with your primary care practitioner   Special Instructions: If you have smoked or chewed Tobacco  in the last 2 yrs please stop smoking, stop any regular Alcohol  and or any Recreational drug use.   Wear Seat belts while driving.   Please note: You were cared for by a hospitalist during your hospital stay. Once you are discharged, your primary care physician will handle any further medical issues. Please note that NO REFILLS for any discharge medications will be authorized once you are discharged, as it is imperative that you return to your primary care physician (or establish a relationship with a primary care physician if you do not  have one) for your post hospital discharge needs so that they can reassess your need for medications and monitor your lab values.  Time coordinating discharge: 35 minutes  SIGNED:  Pamella Pert, MD, PhD 08/03/2023, 10:04 AM

## 2023-08-03 NOTE — TOC Transition Note (Signed)
 Discharge medications are filled and stored in the [facility] awaiting patient discharge.

## 2023-08-06 ENCOUNTER — Telehealth: Payer: Self-pay

## 2023-08-06 NOTE — Progress Notes (Signed)
   08/06/2023  Patient ID: Shelby Anderson, female   DOB: 1956-06-26, 67 y.o.   MRN: [id number]  Received call from patient stating she still has not received Farxiga 10mg  from [facility].  Contacted the program, and the medication was delivered today at 1:42pm.  Called Shelby Anderson back, and she was able to verify this has now been received.    Lenna Gilford, PharmD, DPLA

## 2023-08-06 NOTE — Transitions of Care (Post Inpatient/ED Visit) (Signed)
08/06/2023  Name: Shelby Anderson MRN: [id number] DOB: November 06, 1956  Today's TOC FU Call Status: Today's TOC FU Call Status:: Successful TOC FU Call Completed TOC FU Call Complete Date: 08/06/23 (Incoming call from pt-returning RN CM call.) Patient's Name and Date of Birth confirmed.  Transition Care Management Follow-up Telephone Call Date of Discharge: 08/03/23 Discharge Facility: [facility]) Type of Discharge: Inpatient Admission Primary Inpatient Discharge Diagnosis:: "UTI w/ heamturia" How have you been since you were released from the hospital?: Same (pt states she is "still coughing up green phlegm'-taking abxs as ordered and doing neb/inhaler txs. Denies any SOB-usingCPAP QHS. Appetite fair-occasional N&V-Zofran helping. LBM today. Urine clear-"odor improving" per pt-taking abxs) Any questions or concerns?: No  Items Reviewed: Did you receive and understand the discharge instructions provided?: Yes Medications obtained,verified, and reconciled?: Yes (Medications Reviewed) Any new allergies since your discharge?: No Dietary orders reviewed?: Yes Type of Diet Ordered:: low salt/heart healthy/carb modified Do you have support at home?: Yes People in Home: spouse Name of Support/Comfort Primary Source: Ricky  Medications Reviewed Today: Medications Reviewed Today     Reviewed by Charlyn Minerva, RN (Registered Nurse) on 08/06/23 at 1442  Med List Status: <None>   Medication Order Taking? Sig Documenting Provider Last Dose Status Informant  albuterol (PROVENTIL) (2.5 MG/3ML) 0.083% nebulizer solution [id number] Yes Take 3 mLs (2.5 mg total) by nebulization every 6 (six) hours as needed for wheezing or shortness of breath (J45.40). Roslynn Amble, MD Taking Active Self  albuterol (VENTOLIN HFA) 108 (90 Base) MCG/ACT inhaler [id number] Yes TAKE 2 PUFFS BY MOUTH EVERY 6 HOURS AS NEEDED FOR WHEEZE OR SHORTNESS OF BREATH Parrett, Tammy S, NP Taking Active Self   aspirin EC 81 MG tablet [id number] Yes Take 81 mg at bedtime by mouth.  [provider] Taking Active Self  atorvastatin (LIPITOR) 80 MG tablet [id number] Yes TAKE ONE TABLET BY MOUTH EVERYDAY AT BEDTIME Needs appointment for further refills Karie Georges, MD Taking Active Self  Blood Glucose Monitoring Suppl (ONE TOUCH ULTRA 2) w/Device Andria Rhein [id number] Yes Use as directed Wynn Banker, MD Taking Active Self  budesonide-formoterol (SYMBICORT) 80-4.5 MCG/ACT inhaler [id number] Yes Inhale 2 puffs into the lungs every morning and another 2 puffs 12 hours later. Parrett, Virgel Bouquet, NP Taking Active Self  buPROPion (WELLBUTRIN XL) 150 MG 24 hr tablet [id number] Yes Take 1 tablet (150 mg total) by mouth daily. Karie Georges, MD Taking Active Self  Calcium Carbonate-Vitamin D 600-200 MG-UNIT TABS [id number] Yes Take 1 tablet by mouth daily. [provider] Taking Active Self  cefadroxil (DURICEF) 500 MG capsule [id number] Yes Take 1 capsule (500 mg total) by mouth 2 (two) times daily for 5 days. Leatha Gilding, MD Taking Active   Cholecalciferol (VITAMIN D3) 50 MCG (2000 UT) capsule [id number] Yes Take 2,000 Units by mouth daily. [provider] Taking Active Self  CINNAMON PO [id number] Yes Take 1,000 mg 2 (two) times daily by mouth. [provider] Taking Active Self  dapagliflozin propanediol (FARXIGA) 10 MG TABS tablet [id number] Yes Take 1 tablet (10 mg total) by mouth daily before breakfast. Karie Georges, MD Taking Active Self  doxepin (SINEQUAN) 10 MG capsule [id number] Yes Take by mouth. [provider] Taking Active Self  esomeprazole (NEXIUM) 40 MG capsule [id number] Yes Take 1 capsule (40 mg total) by mouth 2 (two) times daily. Office visit for further refills Hilarie Fredrickson, MD Taking Active Self  Med Note Ripley Fraise, AUDWIN   Wed Jul 31, 2023  8:57 PM) Has to take brand name  famotidine (PEPCID) 20 MG tablet [id number] Yes TAKE ONE  TABLET BY MOUTH TWICE DAILY FOR INDIGESTION / heartburn Hilarie Fredrickson, MD Taking Active Self  fluticasone (FLONASE) 50 MCG/ACT nasal spray [id number] Yes Place 1 spray into both nostrils at bedtime. [provider] Taking Active Self  furosemide (LASIX) 20 MG tablet [id number] Yes 1 tablet daily as needed for ankle swelling Karie Georges, MD Taking Active Self  glipiZIDE (GLUCOTROL XL) 5 MG 24 hr tablet [id number] Yes TAKE ONE TABLET BY MOUTH EVERY MORNING Needs appointment for further refills Karie Georges, MD Taking Active Self  Glucosamine 750 MG TABS [id number] Yes Take 750 mg by mouth 2 (two) times daily. [provider] Taking Active Self  glucose blood (ONETOUCH ULTRA) test strip [id number] Yes 1 each by Other route 2 (two) times daily. Karie Georges, MD Taking Active Self  hydrALAZINE (APRESOLINE) 25 MG tablet [id number] Yes TAKE 1 TABLET BY MOUTH DAILY AS NEEDED ( FOR BLOOD PRESSURE >140/90MMHG UP TO THREE TIMES A DAY ) Tolia, Sunit, DO Taking Active   isosorbide mononitrate (IMDUR) 60 MG 24 hr tablet [id number] Yes TAKE ONE TABLET BY MOUTH EVERY MORNING Needs appointment for further refills Karie Georges, MD Taking Active Self  L-Methylfolate (DEPLIN) 7.5 MG TABS [id number] Yes Take 7.5 mg by mouth daily with breakfast. [provider] Taking Active Self  Lancets [facility] DELICA PLUS [place]) MISC [id number] Yes USE TO CHECK BLOOD SUGAR 2 TIMES A DAY Wynn Banker, MD Taking Active Self  levothyroxine (SYNTHROID) 75 MCG tablet [id number] Yes TAKE 1 TABLET BY MOUTH BEFORE BREAKFAST Karie Georges, MD Taking Active   lisinopril (ZESTRIL) 5 MG tablet [id number] Yes TAKE ONE TABLET BY MOUTH EVERY MORNING Karie Georges, MD Taking Active Self  LYRICA 150 MG capsule [id number] Yes Take 150 mg by mouth 3 (three) times daily.  [provider] Taking Active Self  metFORMIN (GLUCOPHAGE) 500 MG tablet [id number] Yes TAKE ONE TABLET BY MOUTH TWICE  DAILY WITH A MEAL Needs appointment for further refills Karie Georges, MD Taking Active Self  metoprolol succinate (TOPROL-XL) 25 MG 24 hr tablet [id number] Yes Take 1 tablet (25 mg total) by mouth daily. TAKE WITH OR IMMEDIATELY FOLLOWING A MEAL. Tessa Lerner, DO Taking Active Self  Misc Natural Products (GLUCOS-CHONDROIT-MSM COMPLEX PO) [id number] Yes Take 2 tablets by mouth daily. [provider] Taking Active Self  mometasone (ELOCON) 0.1 % cream [id number] Yes Apply topically. [provider] Taking Active Self  montelukast (SINGULAIR) 10 MG tablet [id number] Yes TAKE ONE TABLET BY MOUTH EVERYDAY AT BEDTIME Parrett, Virgel Bouquet, NP Taking Active Self  Multiple Vitamin (MULITIVITAMIN WITH MINERALS) TABS [id number] Yes Take 1 tablet by mouth daily. [provider] Taking Active Self  nitroGLYCERIN (NITROSTAT) 0.4 MG SL tablet [id number] Yes Place 1 tablet (0.4 mg total) under the tongue every 5 (five) minutes as needed for chest pain. Yates Decamp, MD Taking Active Self  NURTEC 75 MG TBDP [id number] Yes Take 1 each by mouth daily as needed. Karie Georges, MD Taking Active Self  ondansetron [facility]) 4 MG tablet [id number] Yes Take 1 tablet (4 mg total) by mouth every 8 (eight) hours as needed for nausea or vomiting. Hilarie Fredrickson, MD Taking Active Self  [facility] ULTRA test strip [id number] Yes USE TO CHECK BLOOD SUGAR 2 TIMES A DAY Koberlein, Paris Lore, MD Taking  Active Self  oxyCODONE (ROXICODONE) 15 MG immediate release tablet [id number] Yes Take 1 tablet by mouth four times a day  Taking Active Self  potassium chloride (KLOR-CON) 10 MEQ tablet [id number] Yes TAKE TWO TABLETS BY MOUTH EVERY MORNING Karie Georges, MD Taking Active Self  promethazine (PHENERGAN) 25 MG tablet [id number] Yes TAKE ONE TABLET BY MOUTH EVERYDAY AT BEDTIME Hilarie Fredrickson, MD Taking Active Self  tiZANidine (ZANAFLEX) 4 MG tablet [id number] Yes Take 8 mg by mouth every 8 (eight) hours as needed for muscle  spasms. [provider] Taking Active Self  vitamin B-12 (CYANOCOBALAMIN) 500 MCG tablet [id number] Yes Take 500 mcg by mouth daily.  [provider] Taking Active Self  vitamin E 200 UNIT capsule [id number] Yes Take 200 Units by mouth daily. [provider] Taking Active Self            Home Care and Equipment/Supplies: Were Home Health Services Ordered?: NA Any new equipment or medical supplies ordered?: NA  Functional Questionnaire: Do you need assistance with bathing/showering or dressing?: No Do you need assistance with meal preparation?: No Do you need assistance with eating?: No Do you have difficulty maintaining continence: No Do you need assistance with getting out of bed/getting out of a chair/moving?: No Do you have difficulty managing or taking your medications?: No  Follow up appointments reviewed: PCP Follow-up appointment confirmed?: Yes Date of PCP follow-up appointment?: 08/08/23 Follow-up Provider: Dr. Casimiro Needle [facility] Follow-up appointment confirmed?: NA Do you need transportation to your follow-up appointment?: No (pt confirms spouse abel to drive her to appts) Do you understand care options if your condition(s) worsen?: Yes-patient verbalized understanding  SDOH Interventions Today    Flowsheet Row Most Recent Value  SDOH Interventions   Food Insecurity Interventions Intervention Not Indicated  Transportation Interventions Intervention Not Indicated      TOC Interventions Today    Flowsheet Row Most Recent Value  TOC Interventions   TOC Interventions Discussed/Reviewed TOC Interventions Discussed      Interventions Today    Flowsheet Row Most Recent Value  Chronic Disease   Chronic disease during today's visit Diabetes, Hypertension (HTN)  General Interventions   General Interventions Discussed/Reviewed General Interventions Discussed, Doctor Visits, Communication with, Durable Medical Equipment (DME)   Doctor Visits Discussed/Reviewed Doctor Visits Discussed, PCP, Specialist  Durable Medical Equipment (DME) BP Cuff, Glucomoter  [pt is checking BP daily-states BP readings have been WNL since returning home BP today 120/72, blood sugars reported as having been WNL as well,cbg today-118]  PCP/Specialist Visits Compliance with follow-up visit  Communication with RN  [contacted CCM nurse to follow up if pt still active with services and request follow up call to pt]  Education Interventions   Education Provided Provided Education  Provided Verbal Education On Nutrition, Blood Sugar Monitoring, When to see the doctor, Other  [sx mgmt-resp & GI-pt takign a lot of meds in the morning then getting nauseated-discussed spacing meds out & eating before taking meds]  Nutrition Interventions   Nutrition Discussed/Reviewed Nutrition Discussed, Fluid intake, Decreasing sugar intake, Increasing proteins, Decreasing fats, Decreasing salt, Adding fruits and vegetables  Pharmacy Interventions   Pharmacy Dicussed/Reviewed Pharmacy Topics Discussed, Medications and their functions  Safety Interventions   Safety Discussed/Reviewed Safety Discussed, Home Safety       Alessandra Grout [facility] Care Management Care Management Community Coordinator Direct Phone: [phone] Toll Free: [phone] Fax: [phone]

## 2023-08-06 NOTE — Transitions of Care (Post Inpatient/ED Visit) (Signed)
   08/06/2023  Name: Shelby Anderson MRN: [id number] DOB: Jun 24, 1956  Today's TOC FU Call Status: Today's TOC FU Call Status:: Unsuccessful Call (1st Attempt) Unsuccessful Call (1st Attempt) Date: 08/06/23  Attempted to reach the patient regarding the most recent Inpatient/ED visit.  Follow Up Plan: Additional outreach attempts will be made to reach the patient to complete the Transitions of Care (Post Inpatient/ED visit) call.    Antionette Fairy, RN,BSN,CCM [facility] Care Management Care Management Community Coordinator Direct Phone: [phone] Toll Free: [phone] Fax: [phone]

## 2023-08-08 ENCOUNTER — Ambulatory Visit (INDEPENDENT_AMBULATORY_CARE_PROVIDER_SITE_OTHER): Payer: Medicare Other | Admitting: Family Medicine

## 2023-08-08 ENCOUNTER — Encounter: Payer: Self-pay | Admitting: Family Medicine

## 2023-08-08 VITALS — BP 100/64 | HR 64 | Temp 98.4°F | Ht 66.0 in | Wt 217.8 lb

## 2023-08-08 DIAGNOSIS — E119 Type 2 diabetes mellitus without complications: Secondary | ICD-10-CM

## 2023-08-08 DIAGNOSIS — R112 Nausea with vomiting, unspecified: Secondary | ICD-10-CM | POA: Diagnosis not present

## 2023-08-08 DIAGNOSIS — Z23 Encounter for immunization: Secondary | ICD-10-CM | POA: Diagnosis not present

## 2023-08-08 LAB — POCT GLYCOSYLATED HEMOGLOBIN (HGB A1C): Hemoglobin A1C: 6.4 % — AB (ref 4.0–5.6)

## 2023-08-08 NOTE — Assessment & Plan Note (Signed)
Chronic, ongoing, pt states that she is taking the promethazine every night and also was given zofran, however she continues to have issues, it appears that it is more chronic and it might be worsened by the doxycycline which can cause nausea. She took her last doxycycline dose this morning so hopefully her symptoms will improve. I reviewed her medications, she is already treated with nexium, pepcid, promethazine. I adivsed pt to speak with her GI doctor, perhaps she might benefit from a Nissen procedure? RTC in 6 months

## 2023-08-08 NOTE — Assessment & Plan Note (Signed)
A1C is well controlled today at 6.4, pt reports compliance with her medications. Reviewed her labs from the hospital.

## 2023-08-08 NOTE — Progress Notes (Signed)
Established Patient Office Visit  Subjective   Patient ID: Shelby Anderson, female    DOB: 1956-07-07  Age: 67 y.o. MRN: [id number]  Chief Complaint  Patient presents with   Medical Management of Chronic Issues    Patient is here for hospital follow up. Was admitted for dehydration/ n/v, was diagnosed with a UTI and admitted for IV antibiotics. Her urine culture grew "multiple species" and they recommended recollection, but she was already being treated with abx. She was discharged on 8/31 and was given 7 days additionally of doxycycline. States that she is some better but she is still having vomiting, states that it is waking her up at night, states she is trying to get into her GI doctor. I reviewed her CT of chest/abd/pelvis which was largely unremarkable, she does have BL inguinal hernias and a small hiatal hernia.     Current Outpatient Medications  Medication Instructions   albuterol (PROVENTIL) 2.5 mg, Nebulization, Every 6 hours PRN   albuterol (VENTOLIN HFA) 108 (90 Base) MCG/ACT inhaler TAKE 2 PUFFS BY MOUTH EVERY 6 HOURS AS NEEDED FOR WHEEZE OR SHORTNESS OF BREATH   aspirin EC 81 mg, Oral, Daily at bedtime   atorvastatin (LIPITOR) 80 MG tablet TAKE ONE TABLET BY MOUTH EVERYDAY AT BEDTIME Needs appointment for further refills   Blood Glucose Monitoring Suppl (ONE TOUCH ULTRA 2) w/Device KIT Use as directed   budesonide-formoterol (SYMBICORT) 80-4.5 MCG/ACT inhaler Inhale 2 puffs into the lungs every morning and another 2 puffs 12 hours later.   buPROPion (WELLBUTRIN XL) 150 mg, Oral, Daily   Calcium Carbonate-Vitamin D 600-200 MG-UNIT TABS 1 tablet, Oral, Daily   CINNAMON PO 1,000 mg, Oral, 2 times daily   dapagliflozin propanediol (FARXIGA) 10 mg, Oral, Daily before breakfast   Deplin 7.5 mg, Oral, Daily with breakfast,     doxepin (SINEQUAN) 10 MG capsule Oral   esomeprazole (NEXIUM) 40 mg, Oral, 2 times daily, Office visit for further refills   famotidine (PEPCID) 20  MG tablet TAKE ONE TABLET BY MOUTH TWICE DAILY FOR INDIGESTION / heartburn   fluticasone (FLONASE) 50 MCG/ACT nasal spray 1 spray, Each Nare, Nightly   furosemide (LASIX) 20 MG tablet 1 tablet daily as needed for ankle swelling   glipiZIDE (GLUCOTROL XL) 5 MG 24 hr tablet TAKE ONE TABLET BY MOUTH EVERY MORNING Needs appointment for further refills   Glucosamine 750 mg, Oral, 2 times daily   glucose blood (ONETOUCH ULTRA) test strip 1 each, Other, 2 times daily   hydrALAZINE (APRESOLINE) 25 MG tablet TAKE 1 TABLET BY MOUTH DAILY AS NEEDED ( FOR BLOOD PRESSURE >140/90MMHG UP TO THREE TIMES A DAY )   isosorbide mononitrate (IMDUR) 60 MG 24 hr tablet TAKE ONE TABLET BY MOUTH EVERY MORNING Needs appointment for further refills   Lancets (ONETOUCH DELICA PLUS LANCET30G) MISC USE TO CHECK BLOOD SUGAR 2 TIMES A DAY   levothyroxine (SYNTHROID) 75 MCG tablet TAKE 1 TABLET BY MOUTH BEFORE BREAKFAST   lisinopril (ZESTRIL) 5 mg, Oral, Every morning   Lyrica 150 mg, Oral, 3 times daily   metFORMIN (GLUCOPHAGE) 500 MG tablet TAKE ONE TABLET BY MOUTH TWICE DAILY WITH A MEAL Needs appointment for further refills   metoprolol succinate (TOPROL-XL) 25 mg, Oral, Daily, TAKE WITH OR IMMEDIATELY FOLLOWING A MEAL.   Misc Natural Products (GLUCOS-CHONDROIT-MSM COMPLEX PO) 2 tablets, Oral, Daily   mometasone (ELOCON) 0.1 % cream Topical   montelukast (SINGULAIR) 10 MG tablet TAKE ONE TABLET BY MOUTH EVERYDAY AT  BEDTIME   Multiple Vitamin (MULITIVITAMIN WITH MINERALS) TABS 1 tablet, Oral, Daily,     nitroGLYCERIN (NITROSTAT) 0.4 mg, Sublingual, Every 5 min PRN   NURTEC 75 MG TBDP 1 each, Oral, Daily PRN   ondansetron (ZOFRAN) 4 mg, Oral, Every 8 hours PRN   ONETOUCH ULTRA test strip USE TO CHECK BLOOD SUGAR 2 TIMES A DAY   oxyCODONE (ROXICODONE) 15 MG immediate release tablet Take 1 tablet by mouth four times a day   potassium chloride (KLOR-CON) 10 MEQ tablet 20 mEq, Oral, Every morning   promethazine (PHENERGAN) 25  MG tablet TAKE ONE TABLET BY MOUTH EVERYDAY AT BEDTIME   tiZANidine (ZANAFLEX) 8 mg, Oral, Every 8 hours PRN   vitamin B-12 (CYANOCOBALAMIN) 500 mcg, Oral, Daily   Vitamin D3 2,000 Units, Oral, Daily   vitamin E 200 Units, Oral, Daily    Patient Active Problem List   Diagnosis Date Noted   UTI (urinary tract infection) 08/01/2023   Nausea and vomiting 07/31/2023   Acute cystitis 07/31/2023   Hypothyroidism 09/11/2022   Dental caries 06/12/2022   Spondylosis without myelopathy or radiculopathy, cervical region 04/21/2021   Neutropenia with fever (HCC) 10/01/2019   Genetic testing 09/17/2019   Family history of melanoma    Family history of pancreatic cancer    Malignant neoplasm of upper-outer quadrant of left breast in female, estrogen receptor negative (HCC) 09/07/2019   Encounter for loop recorder at end of battery life 04/24/2018   Esophageal stricture 07/01/2017   Hyperlipidemia associated with type 2 diabetes mellitus (HCC) 05/20/2017   Allergic rhinitis 03/11/2017   Dilated cardiomyopathy (HCC) 02/07/2017   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 11/13/2016   Morbid obesity due to excess calories (HCC) 09/26/2016   Increased endometrial stripe thickness 06/27/2016   Intramural leiomyoma of uterus 06/27/2016   Type 2 diabetes mellitus without complication, without long-term current use of insulin (HCC) 12/06/2015   HTN (hypertension), benign 07/19/2015   Arrhythmia 07/19/2015   Orthostatic hypotension 07/19/2015   GERD (gastroesophageal reflux disease) 07/19/2015   Chronic back pain 07/19/2015   Neuropathy 07/19/2015   Symptomatic PVCs 11/02/2014   Syncope 11/02/2014   Status post placement of implantable loop recorder 11/02/2014   Stenosis of cervix 01/07/2014   Chest pain 12/23/2013   Disorder of cervix 03/10/2013   Vaginal atrophy 03/10/2013   OSA (obstructive sleep apnea) 07/31/2012   Asthma 04/29/2012   Coronary artery disease 11/20/2011      Review  of Systems  All other systems reviewed and are negative.     Objective:     BP 100/64 (BP Location: Right Arm, Patient Position: Sitting, Cuff Size: Large)   Pulse 64   Temp 98.4 F (36.9 C) (Oral)   Ht 5\' 6"  (1.676 m)   Wt 217 lb 12.8 oz (98.8 kg)   SpO2 98%   BMI 35.15 kg/m    Physical Exam Vitals reviewed.  Constitutional:      Appearance: Normal appearance. She is obese.  Eyes:     Conjunctiva/sclera: Conjunctivae normal.  Cardiovascular:     Rate and Rhythm: Normal rate and regular rhythm.     Pulses: Normal pulses.     Heart sounds: Normal heart sounds. No murmur heard. Pulmonary:     Effort: Pulmonary effort is normal.     Breath sounds: Normal breath sounds. No wheezing.  Neurological:     Mental Status: She is alert and oriented to person, place, and time. Mental status is at baseline.  Psychiatric:        Mood and Affect: Mood normal.        Behavior: Behavior normal.      Results for orders placed or performed in visit on 08/08/23  POC HgB A1c  Result Value Ref Range   Hemoglobin A1C 6.4 (A) 4.0 - 5.6 %   HbA1c POC (<> result, manual entry)     HbA1c, POC (prediabetic range)     HbA1c, POC (controlled diabetic range)        The ASCVD Risk score (Arnett DK, et al., 2019) failed to calculate for the following reasons:   The valid total cholesterol range is 130 to 320 mg/dL    Assessment & Plan:  Type 2 diabetes mellitus without complication, without long-term current use of insulin (HCC) Assessment & Plan: A1C is well controlled today at 6.4, pt reports compliance with her medications. Reviewed her labs from the hospital.   Orders: -     POCT glycosylated hemoglobin (Hb A1C)  Need for immunization against influenza -     Flu Vaccine Trivalent High Dose (Fluad)  Nausea and vomiting, unspecified vomiting type Assessment & Plan:  Chronic, ongoing, pt states that she is taking the promethazine every night and also was given zofran, however she  continues to have issues, it appears that it is more chronic and it might be worsened by the doxycycline which can cause nausea. She took her last doxycycline dose this morning so hopefully her symptoms will improve. I reviewed her medications, she is already treated with nexium, pepcid, promethazine. I adivsed pt to speak with her GI doctor, perhaps she might benefit from a Nissen procedure? RTC in 6 months     Return in about 6 months (around 02/05/2024) for DM, HTN.    Karie Georges, MD

## 2023-08-08 NOTE — Patient Instructions (Signed)
Ask your GI doctor about a procedure called a Nissen Fundoplication-- would this be effective in managing your vomiting?

## 2023-08-09 ENCOUNTER — Other Ambulatory Visit (HOSPITAL_COMMUNITY): Payer: Self-pay

## 2023-08-09 ENCOUNTER — Telehealth: Payer: Self-pay | Admitting: Pharmacist

## 2023-08-09 NOTE — Progress Notes (Signed)
   08/09/2023  Patient ID: Shelby Anderson, female   DOB: [birth year], 67 y.o.   MRN: [id number]  Patient has concerns regarding price increase of medications with [facility] for $31 to [facility], which was $75 this month. Reviewed plan information. Cheapest options would be [facility] mail order pharmacy. OR [facility], [place], [facility], or [facility] at the same price.   [facility] is considered out-of-network with [facility] Medicare, which explains the cost increase seen.   Patient interested in transferring prescriptions to [facility] for home delivery and local ability to go to pharmacy with any issues. Contacted pharmacy and process started for medication transfers. Patient noted that 08/17/23 is when next Lyrica is due.   Will be notified by pharmacy if any issues obtaining medications on her list. Advising patient to contact Thyra Breed for Lyrica refill to be sent to pharmacy. Provided pharmacy phone number for patient to reach out in 1 week for payment and shipping information.   Marlowe Aschoff, PharmD [facility] Phone Number: [phone]

## 2023-08-12 ENCOUNTER — Other Ambulatory Visit (HOSPITAL_COMMUNITY): Payer: Self-pay

## 2023-08-12 ENCOUNTER — Telehealth: Payer: Self-pay

## 2023-08-12 NOTE — Telephone Encounter (Signed)
Unsuccessful attempt to reach patient on preferred number listed in notes for scheduled AWV. Left message on voicemail okay to reschedule. 

## 2023-08-15 ENCOUNTER — Other Ambulatory Visit (HOSPITAL_COMMUNITY): Payer: Self-pay

## 2023-08-19 ENCOUNTER — Other Ambulatory Visit (HOSPITAL_COMMUNITY): Payer: Self-pay

## 2023-08-19 ENCOUNTER — Ambulatory Visit (INDEPENDENT_AMBULATORY_CARE_PROVIDER_SITE_OTHER): Payer: Medicare Other | Admitting: Podiatry

## 2023-08-19 DIAGNOSIS — B351 Tinea unguium: Secondary | ICD-10-CM

## 2023-08-19 DIAGNOSIS — M79675 Pain in left toe(s): Secondary | ICD-10-CM | POA: Diagnosis not present

## 2023-08-19 DIAGNOSIS — M79674 Pain in right toe(s): Secondary | ICD-10-CM

## 2023-08-19 DIAGNOSIS — L84 Corns and callosities: Secondary | ICD-10-CM

## 2023-08-19 DIAGNOSIS — E1151 Type 2 diabetes mellitus with diabetic peripheral angiopathy without gangrene: Secondary | ICD-10-CM

## 2023-08-19 NOTE — Progress Notes (Signed)
Subjective:  Patient ID: Shelby Anderson, female    DOB: 1955-12-18,  MRN: [id number]  Shelby Anderson presents to clinic today for:  Chief Complaint  Patient presents with   routine foot care    Nails and calluses,diabetic   Patient notes nails are thick and elongated, causing pain in shoe gear when ambulating.  She has painful calluses bilateral submet 5.  She underwent surgery in July from Dr. Allena Katz to adjust the bone position of the fifth metatarsals to try to decrease her callus formation.  As she does not have any follow-ups scheduled with him in the future.  Notes that she still has some pain on the plantar aspect of the right forefoot since the surgery.  PCP is Karie Georges, MD. date last seen is August 08, 2023  Past Medical History:  Diagnosis Date   Allergy    Anemia    "chronic"   Angina    Prinzmetal angina   Anxiety and depression    Arthritis    Asthma    Atrial fibrillation (HCC)    h/o "AF w/frequent PVCs"   Breast cancer (HCC)    Left   Cancer (HCC)    hx of skin cancer    CHF (congestive heart failure) (HCC)    Chronic back pain greater than 3 months duration    on chronic narcotics, treated at pain clinic   Chronic respiratory failure (HCC) 09/15/2015   ONO 09/04/15 + desats >begin O2 at 2l/ m    Colon polyps    hyperplastic   Coronary artery disease    Arrythmia, orthostatic hypotension, HLD, HTN; sees Dr. Jacinto Halim   Difficult intubation    "TMJ & woke up when they were still cutting on me"   Dysrhythmia    sees Dr. 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 asthma   Stenotic cervical os    Stomach ulcer    "3 small; found in 05/2011"   TMJ (dislocation of temporomandibular joint)    Tuberculosis    + TB SKIN TEST   Type 2 diabetes mellitus without complication, without long-term current use of insulin (HCC) 12/06/2015   not on meds     Allergies  Allergen Reactions   Lodine [Etodolac] Anaphylaxis, Hives and Swelling   Oxycontin [  Oxycodone Hcl] Anaphylaxis    hives, trouble breathing, tongue swelling (Only Oxycontin) Tolerates plain oxycodone.   Penicillins Anaphylaxis    Told by a surgeon never to take it again. Has patient had a PCN reaction causing immediate rash, facial/tongue/throat swelling, SOB or lightheadedness with hypotension: Yes Has patient had a PCN reaction causing severe rash involving mucus membranes or skin necrosis: Unknown Has patient had a PCN reaction that required hospitalization: No Has patient had a PCN reaction occurring within the last 10 years: No If all of the above answers are "NO", then may proceed with Cephalosporin use.   Aspirin Other (See Comments)    High-dose caused GI Bleeds   Darvocet [Propoxyphene N-Acetaminophen] Hives   Nitroglycerin Other (See Comments)    IV-BP drops dramatically Can take po   Propoxyphene Hives   Tramadol Hives and Itching   Valium Other (See Comments)    Circulation problems. "Legs turned black".    Objective:  There  were no vitals filed for this visit.  Shelby Anderson is a pleasant 67 y.o. female in NAD. AAO x 3.  Vascular Examination: Patient has palpable DP pulse, absent PT pulse bilateral.  Delayed capillary refill bilateral toes.  Sparse digital hair bilateral.  Proximal to distal cooling WNL bilateral.    Dermatological Examination: Interspaces are clear with no open lesions noted bilateral.  Skin is shiny and atrophic bilateral.  Nails are 3-2mm thick, with yellowish/brown discoloration, subungual debris and distal onycholysis x8.  There is pain with compression of nails x8.  There are hyperkeratotic lesions noted submet 5 bilateral .  Neurological Examination: Protective sensation intact b/l LE.  Musculoskeletal Examination: Muscle strength 5/5 to all LE muscle groups b/l.  POP plantar right forefoot in 4th interspace, but proximal to sulcus area and met heads.  No nodules noted     Latest Ref Rng & Units 08/08/2023    4:20 PM 01/29/2023   10:30 AM 09/06/2022    4:10 PM  Hemoglobin A1C  Hemoglobin-A1c 4.0 - 5.6 % 6.4  6.9  6.5    Patient qualifies for at-risk foot care because of Diabetes with PVD .  Assessment/Plan: 1. Pain due to onychomycosis of toenails of both feet   2. Diabetes mellitus type 2 with peripheral artery disease (HCC)   3. Callus     Mycotic nails x8 were sharply debrided with sterile nail nippers and power debriding burr to decrease bulk and length.  Hyperkeratotic lesions x2 were shaved / sanded with #312 blade & umbrella bur.  Recommend the patient purchase Salonpas gel to massage into the area of discomfort on the plantar aspect of the right forefoot followed by massage to the area with either a golf ball or tennis ball.  Recommended physical therapy to have ultrasound performed to this area in case there is some nerve entrapment and scar tissue in the adjacent area.  She will call if the at home therapy is ineffective so that we can get her set up with formal  physical therapy.  If she has any continued questions or concerns from her surgery she was encouraged to follow-up with Dr. Allena Katz.  Return in about 3 months (around 11/18/2023) for [facility]   Clerance Lav, DPM, FACFAS [facility]     [address]  [place], [place] [address]                Office [phone]  Fax [phone]

## 2023-08-20 ENCOUNTER — Other Ambulatory Visit (HOSPITAL_COMMUNITY): Payer: Self-pay

## 2023-08-20 ENCOUNTER — Other Ambulatory Visit: Payer: Self-pay | Admitting: Internal Medicine

## 2023-08-21 ENCOUNTER — Other Ambulatory Visit (HOSPITAL_COMMUNITY): Payer: Self-pay

## 2023-08-21 ENCOUNTER — Ambulatory Visit
Admission: RE | Admit: 2023-08-21 | Discharge: 2023-08-21 | Disposition: A | Discharge status: Home / Self Care | Payer: Medicare Other | Source: Ambulatory Visit | Attending: Family Medicine | Admitting: Family Medicine

## 2023-08-21 DIAGNOSIS — Z1231 Encounter for screening mammogram for malignant neoplasm of breast: Secondary | ICD-10-CM | POA: Diagnosis not present

## 2023-08-21 MED ORDER — TIZANIDINE HCL 4 MG PO TABS
8.0000 mg | ORAL_TABLET | Freq: Three times a day (TID) | ORAL | 0 refills | Status: DC
  Filled 2023-08-27: qty 540, 90d supply, fill #0
  Filled 2023-11-20: qty 30, 5d supply, fill #0
  Filled 2023-11-25: qty 180, 30d supply, fill #0
  Filled 2023-12-02 – 2023-12-19 (×3): qty 180, 30d supply, fill #1

## 2023-08-21 MED ORDER — HYDRALAZINE HCL 25 MG PO TABS: 25.0000 mg | ORAL_TABLET | Freq: Every day | ORAL | 5 refills | Status: DC | PRN

## 2023-08-22 ENCOUNTER — Encounter: Payer: Self-pay | Admitting: Adult Health

## 2023-08-22 ENCOUNTER — Other Ambulatory Visit (HOSPITAL_COMMUNITY): Payer: Self-pay

## 2023-08-22 ENCOUNTER — Other Ambulatory Visit: Payer: Self-pay

## 2023-08-22 ENCOUNTER — Ambulatory Visit: Payer: Medicare Other | Admitting: Adult Health

## 2023-08-22 ENCOUNTER — Telehealth: Payer: Self-pay | Admitting: Internal Medicine

## 2023-08-22 VITALS — BP 124/82 | HR 81 | Temp 98.1°F | Ht 66.0 in | Wt 218.4 lb

## 2023-08-22 DIAGNOSIS — J9611 Chronic respiratory failure with hypoxia: Secondary | ICD-10-CM | POA: Diagnosis not present

## 2023-08-22 DIAGNOSIS — N3000 Acute cystitis without hematuria: Secondary | ICD-10-CM

## 2023-08-22 DIAGNOSIS — J302 Other seasonal allergic rhinitis: Secondary | ICD-10-CM | POA: Diagnosis not present

## 2023-08-22 DIAGNOSIS — J45991 Cough variant asthma: Secondary | ICD-10-CM

## 2023-08-22 DIAGNOSIS — J4530 Mild persistent asthma, uncomplicated: Secondary | ICD-10-CM | POA: Diagnosis not present

## 2023-08-22 DIAGNOSIS — G4733 Obstructive sleep apnea (adult) (pediatric): Secondary | ICD-10-CM | POA: Diagnosis not present

## 2023-08-22 MED ORDER — FAMOTIDINE 20 MG PO TABS: 20.0000 mg | ORAL_TABLET | Freq: Two times a day (BID) | ORAL | 0 refills | Status: DC

## 2023-08-22 MED ORDER — LEVOTHYROXINE SODIUM 75 MCG PO TABS
75.0000 ug | ORAL_TABLET | Freq: Every day | ORAL | 5 refills | Status: DC
  Filled 2023-08-27: qty 30, 30d supply, fill #0
  Filled 2023-09-24: qty 30, 30d supply, fill #1

## 2023-08-22 MED ORDER — LISINOPRIL 5 MG PO TABS
5.0000 mg | ORAL_TABLET | Freq: Every morning | ORAL | 1 refills | Status: DC
  Filled 2023-08-27: qty 30, 30d supply, fill #0

## 2023-08-22 MED ORDER — MONTELUKAST SODIUM 10 MG PO TABS
10.0000 mg | ORAL_TABLET | Freq: Every evening | ORAL | 3 refills | Status: DC
  Filled 2023-08-22: qty 90, 90d supply, fill #0
  Filled 2023-10-15: qty 30, 30d supply, fill #0
  Filled 2023-10-15: qty 90, 90d supply, fill #0
  Filled 2023-11-20 – 2023-11-25 (×2): qty 30, 30d supply, fill #1
  Filled 2023-12-02 – 2023-12-19 (×3): qty 30, 30d supply, fill #2
  Filled 2024-01-07 – 2024-01-14 (×2): qty 30, 30d supply, fill #3
  Filled 2024-01-28 – 2024-02-11 (×2): qty 30, 30d supply, fill #4
  Filled 2024-02-17 – 2024-03-10 (×3): qty 30, 30d supply, fill #5
  Filled 2024-03-23 – 2024-04-02 (×2): qty 30, 30d supply, fill #6
  Filled 2024-04-20 – 2024-04-28 (×2): qty 30, 30d supply, fill #7
  Filled 2024-05-13 – 2024-06-04 (×2): qty 30, 30d supply, fill #8
  Filled 2024-07-07: qty 30, 30d supply, fill #9
  Filled 2024-08-04: qty 30, 30d supply, fill #10

## 2023-08-22 MED ORDER — ATORVASTATIN CALCIUM 80 MG PO TABS
80.0000 mg | ORAL_TABLET | Freq: Every day | ORAL | 4 refills | Status: DC
  Filled 2023-08-27: qty 30, 30d supply, fill #0
  Filled 2023-09-24: qty 30, 30d supply, fill #1
  Filled 2023-11-26: qty 30, 30d supply, fill #2
  Filled 2023-12-02 – 2023-12-19 (×4): qty 30, 30d supply, fill #3

## 2023-08-22 MED ORDER — GLIPIZIDE ER 5 MG PO TB24
5.0000 mg | ORAL_TABLET | Freq: Every morning | ORAL | 4 refills | Status: DC
  Filled 2023-08-27 – 2023-08-29 (×2): qty 30, 30d supply, fill #0
  Filled 2023-09-24: qty 30, 30d supply, fill #1

## 2023-08-22 MED ORDER — HYDRALAZINE HCL 25 MG PO TABS
25.0000 mg | ORAL_TABLET | Freq: Every day | ORAL | 10 refills | Status: DC | PRN
  Filled 2023-08-22: qty 30, 30d supply, fill #0
  Filled 2023-11-25: qty 30, 10d supply, fill #0
  Filled 2023-12-02 – 2023-12-19 (×2): qty 30, 10d supply, fill #1
  Filled 2024-01-07 – 2024-01-14 (×2): qty 30, 10d supply, fill #2
  Filled ????-??-??: fill #1

## 2023-08-22 MED ORDER — ISOSORBIDE MONONITRATE ER 60 MG PO TB24
60.0000 mg | ORAL_TABLET | Freq: Every morning | ORAL | 4 refills | Status: DC
  Filled 2023-09-24: qty 30, 30d supply, fill #0
  Filled 2023-11-26: qty 30, 30d supply, fill #1
  Filled 2023-12-02 – 2023-12-19 (×3): qty 30, 30d supply, fill #2

## 2023-08-22 MED ORDER — ALBUTEROL SULFATE (2.5 MG/3ML) 0.083% IN NEBU
2.5000 mg | INHALATION_SOLUTION | Freq: Four times a day (QID) | RESPIRATORY_TRACT | 5 refills | Status: AC | PRN
  Filled 2023-08-22: qty 75, 7d supply, fill #0

## 2023-08-22 MED ORDER — ALBUTEROL SULFATE HFA 108 (90 BASE) MCG/ACT IN AERS
1.0000 | INHALATION_SPRAY | RESPIRATORY_TRACT | 3 refills | Status: AC | PRN
  Filled 2023-08-22: qty 6.7, 17d supply, fill #0
  Filled 2024-06-08: qty 6.7, 17d supply, fill #1

## 2023-08-22 MED ORDER — METFORMIN HCL 500 MG PO TABS
500.0000 mg | ORAL_TABLET | Freq: Two times a day (BID) | ORAL | 4 refills | Status: DC
  Filled 2023-08-27: qty 60, 30d supply, fill #0
  Filled 2023-09-24: qty 60, 30d supply, fill #1
  Filled 2023-11-20 – 2023-11-25 (×2): qty 60, 30d supply, fill #2
  Filled 2023-12-02 – 2023-12-19 (×3): qty 60, 30d supply, fill #3

## 2023-08-22 MED ORDER — PREGABALIN 150 MG PO CAPS
150.0000 mg | ORAL_CAPSULE | Freq: Three times a day (TID) | ORAL | 2 refills | Status: DC
  Filled 2023-08-22 – 2023-08-23 (×2): qty 90, 30d supply, fill #0
  Filled 2023-09-24: qty 90, 30d supply, fill #1
  Filled 2023-10-15 – 2023-10-18 (×2): qty 90, 30d supply, fill #2

## 2023-08-22 MED ORDER — DOXEPIN HCL 50 MG PO CAPS
50.0000 mg | ORAL_CAPSULE | Freq: Every day | ORAL | 2 refills | Status: DC
  Filled 2023-08-27: qty 30, 30d supply, fill #0
  Filled 2023-09-24: qty 30, 30d supply, fill #1

## 2023-08-22 MED ORDER — L-METHYLFOLATE 7.5 MG PO TABS
7.5000 mg | ORAL_TABLET | Freq: Every morning | ORAL | 4 refills | Status: DC
  Filled 2023-08-27 – 2023-09-24 (×3): qty 30, 30d supply, fill #0

## 2023-08-22 MED ORDER — BUDESONIDE-FORMOTEROL FUMARATE 80-4.5 MCG/ACT IN AERO: INHALATION_SPRAY | RESPIRATORY_TRACT | 3 refills | Status: DC

## 2023-08-22 MED ORDER — BUDESONIDE-FORMOTEROL FUMARATE 80-4.5 MCG/ACT IN AERO
INHALATION_SPRAY | RESPIRATORY_TRACT | 3 refills | Status: AC
  Filled 2023-08-22: qty 30.6, 90d supply, fill #0
  Filled 2023-11-25: qty 10.2, 30d supply, fill #1
  Filled 2023-12-02 – 2023-12-19 (×3): qty 10.2, 30d supply, fill #2
  Filled 2024-01-07 – 2024-01-14 (×2): qty 10.2, 30d supply, fill #3
  Filled 2024-01-28 – 2024-02-11 (×2): qty 10.2, 30d supply, fill #4
  Filled 2024-02-17 – 2024-03-10 (×3): qty 10.2, 30d supply, fill #5
  Filled 2024-03-23 – 2024-04-20 (×3): qty 10.2, 30d supply, fill #6

## 2023-08-22 MED ORDER — BUPROPION HCL ER (XL) 150 MG PO TB24
150.0000 mg | ORAL_TABLET | Freq: Every day | ORAL | 1 refills | Status: DC
  Filled 2023-08-27: qty 30, 30d supply, fill #0

## 2023-08-22 NOTE — Assessment & Plan Note (Signed)
Excellent control compliance on nocturnal CPAP.  No changes in setting  Plan  Patient Instructions  Continue on Symbicort 2 puffs Twice daily  , rinse after use.  Continue on Singulair daily  Albuterol As needed    Continue on CPAP At bedtime   Wear all night long for at least 6hr each day.  Work on [employer] and weight loss.  Do not drive if sleepy   Follow up with Dr. Vassie Loll  In 1 year and As needed   Please contact office for sooner follow up if symptoms do not improve or worsen or seek emergency care

## 2023-08-22 NOTE — Patient Instructions (Signed)
Continue on Symbicort 2 puffs Twice daily  , rinse after use.  Continue on Singulair daily  Albuterol As needed    Continue on CPAP At bedtime   Wear all night long for at least 6hr each day.  Work on [employer] and weight loss.  Do not drive if sleepy   Follow up with Dr. Vassie Loll  In 1 year and As needed   Please contact office for sooner follow up if symptoms do not improve or worsen or seek emergency care

## 2023-08-22 NOTE — Progress Notes (Signed)
@Patient  ID: Shelby Anderson, female    DOB: 1956-02-15, 67 y.o.   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  Underwent lobectomy and January 2021.  Completed her course of radiation Medical history for diabetes, chronic back pain, A-fib, previous positive PPD Former RN-on disability  TEST/EVENTS :  Spirometry 12/2011 - no airway obstruction -No reversibility with bronchodilator , mod restriction   Spirometry 07/2018 FVC 1.64 (46%), FEV1 1.34 (49%), ratio 82 (104%)    07/2018 FENO 11   Home sleep test 07/2012 - showed desatn,no OSA   11/2015 ONO positive , Started on oxygen 2l/m .   10/2016 CT angio neg PE   CT sinus 01/2017 >> clear   12/2013 -admitted  asthma exacerbation,CAP, RSV bronchitis.  EGD 03/2011 ([facility]) - peptic ulcer disease.  12/2013  barium esophagram - distal esophageal stricture, stricture dilated again 0109,3235 (perry) 01/2017  Swallow >> Persistent/recurrent stricture at the GE junction    HST 08/2018 AHI 9/h 09/2018 >> CPAP 10 cm, med FF mask  08/22/2023 Follow up ; Asthma and OSA  Patient presents for a 1 year follow-up.  Last seen July 2023 patient is followed for mild persistent asthma.  She remains on Symbicort twice daily.  Singulair daily.  Says overall breathing has been doing okay.  She denies any flare of cough or wheezing.  Gets winded with heavy activities. Was recently admitted to the hospital for urinary tract infection.  Says she is doing better.  Completed her course of antibiotics. Chest xray was clear.  Has chronic  rhinitis since her allergies are doing okay.  Remains on Singulair daily.  Patient has obstructive sleep apnea is on nocturnal CPAP.  Says she wears her CPAP every single night.  Says that she benefits from CPAP with decreased daytime sleepiness.  CPAP download shows 83% compliance.  Daily average usage at 8 hours.  Patient is on CPAP 10 cm H2O.  AHI 0.5/hour.  (Time missed was during her hospital stay)   Allergies  Allergen Reactions   Lodine [Etodolac] Anaphylaxis, Hives and Swelling   Oxycontin [Oxycodone Hcl] Anaphylaxis    hives, trouble breathing, tongue swelling (Only Oxycontin) Tolerates plain oxycodone.   Penicillins Anaphylaxis    Told by a surgeon never to take it again. Has patient had a PCN reaction causing immediate rash, facial/tongue/throat swelling, SOB or lightheadedness with hypotension: Yes Has patient had a PCN reaction causing severe rash involving mucus membranes or skin necrosis: Unknown Has patient had a PCN reaction that required hospitalization: No Has patient had a PCN reaction occurring within the last 10 years: No If all of the above answers are "NO", then may proceed with Cephalosporin use.   Aspirin Other (See Comments)    High-dose caused GI Bleeds   Darvocet [Propoxyphene N-Acetaminophen] Hives   Nitroglycerin Other (See Comments)    IV-BP drops dramatically Can take po   Propoxyphene Hives   Tramadol Hives and Itching   Valium Other (See Comments)    Circulation problems. "Legs turned black".    Immunization History  Administered Date(s) Administered   Fluad Trivalent(High Dose 65+) 08/08/2023  Influenza Split 08/19/2011   Influenza Whole 01/03/2013   Influenza,inj,Quad PF,6+ Mos 08/20/2013, 09/15/2014, 08/26/2015, 08/01/2016, 08/22/2017, 10/15/2018, 09/24/2019, 12/07/2020, 02/05/2022, 09/06/2022   Influenza-Unspecified 08/03/2016   Moderna Sars-Covid-2 Vaccination 03/11/2020, 04/08/2020, 08/03/2020   PNEUMOCOCCAL CONJUGATE-20 02/05/2022    Pneumococcal Conjugate-13 02/24/2021   Pneumococcal Polysaccharide-23 08/19/2011, 12/12/2017   Tdap 06/28/2014   Zoster Recombinant(Shingrix) 09/03/2022, 11/04/2022    Past Medical History:  Diagnosis Date   Allergy    Anemia    "chronic"   Angina    Prinzmetal angina   Anxiety and depression    Arthritis    Asthma    Atrial fibrillation (HCC)    h/o "AF w/frequent PVCs"   Breast cancer (HCC)    Left   Cancer (HCC)    hx of skin cancer    CHF (congestive heart failure) (HCC)    Chronic back pain greater than 3 months duration    on chronic narcotics, treated at pain clinic   Chronic respiratory failure (HCC) 09/15/2015   ONO 09/04/15 + desats >begin O2 at 2l/ m    Colon polyps    hyperplastic   Coronary artery disease    Arrythmia, orthostatic hypotension, HLD, HTN; sees Dr. Jacinto Halim   Difficult intubation    "TMJ & woke up when they were still cutting on me"   Dysrhythmia    sees Dr. 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 asthma   Stenotic cervical os     Stomach ulcer    "3 small; found in 05/2011"   TMJ (dislocation of temporomandibular joint)    Tuberculosis    + TB SKIN TEST   Type 2 diabetes mellitus without complication, without long-term current use of insulin (HCC) 12/06/2015   not on meds     Tobacco History: Social History   Tobacco Use  Smoking Status Never  Smokeless Tobacco Never   Counseling given: Not Answered   Outpatient Medications Prior to Visit  Medication Sig Dispense Refill   aspirin EC 81 MG tablet Take 81 mg at bedtime by mouth.      atorvastatin (LIPITOR) 80 MG tablet TAKE ONE TABLET BY MOUTH EVERYDAY AT BEDTIME Needs appointment for further refills 90 tablet 1   Blood Glucose Monitoring Suppl (ONE TOUCH ULTRA 2) w/Device KIT Use as directed 1 kit 1   buPROPion (  WELLBUTRIN XL) 150 MG 24 hr tablet Take 1 tablet (150 mg total) by mouth daily. 90 tablet 1   Calcium Carbonate-Vitamin D 600-200 MG-UNIT TABS Take 1 tablet by mouth daily.     Cholecalciferol (VITAMIN D3) 50 MCG (2000 UT) capsule Take 2,000 Units by mouth daily.     CINNAMON PO Take 1,000 mg 2 (two) times daily by mouth.     dapagliflozin propanediol (FARXIGA) 10 MG TABS tablet Take 1 tablet (10 mg total) by mouth daily before breakfast. 90 tablet 1   doxepin (SINEQUAN) 10 MG capsule Take by mouth.     esomeprazole (NEXIUM) 40 MG capsule Take 1 capsule (40 mg total) by mouth 2 (two) times daily. Office visit for further refills 180 capsule 0   famotidine (PEPCID) 20 MG tablet TAKE ONE TABLET BY MOUTH TWICE DAILY FOR INDIGESTION / heartburn 180 tablet 1   fluticasone (FLONASE) 50 MCG/ACT nasal spray Place 1 spray into both nostrils at bedtime.     furosemide (LASIX) 20 MG tablet Take 1 tablet (20 mg total) by mouth daily as needed for ankle swelling. 180 tablet 1   glipiZIDE (GLUCOTROL XL) 5 MG 24 hr tablet TAKE ONE TABLET BY MOUTH EVERY MORNING Needs appointment for further refills 90 tablet 1   Glucosamine 750 MG TABS Take 750 mg by mouth 2 (two)  times daily.     glucose blood (ONETOUCH ULTRA) test strip 1 each by Other route 2 (two) times daily. 100 strip 3   hydrALAZINE (APRESOLINE) 25 MG tablet TAKE 1 TABLET BY MOUTH DAILY AS NEEDED ( FOR BLOOD PRESSURE >140/90MMHG UP TO THREE TIMES A DAY ) 30 tablet 10   hydrALAZINE (APRESOLINE) 25 MG tablet Take 1 tablet (25 mg total) by mouth daily as needed (for blood pressure >140/1mmHg up to three times daily). 30 tablet 5   isosorbide mononitrate (IMDUR) 60 MG 24 hr tablet TAKE ONE TABLET BY MOUTH EVERY MORNING Needs appointment for further refills 90 tablet 1   L-Methylfolate (DEPLIN) 7.5 MG TABS Take 7.5 mg by mouth daily with breakfast.     Lancets (ONETOUCH DELICA PLUS LANCET30G) MISC USE TO CHECK BLOOD SUGAR 2 TIMES A DAY 100 each 1   levothyroxine (SYNTHROID) 75 MCG tablet TAKE 1 TABLET BY MOUTH BEFORE BREAKFAST 30 tablet 5   lisinopril (ZESTRIL) 5 MG tablet TAKE ONE TABLET BY MOUTH EVERY MORNING 90 tablet 1   LYRICA 150 MG capsule Take 150 mg by mouth 3 (three) times daily.   2   metFORMIN (GLUCOPHAGE) 500 MG tablet TAKE ONE TABLET BY MOUTH TWICE DAILY WITH A MEAL Needs appointment for further refills 180 tablet 1   metoprolol succinate (TOPROL-XL) 25 MG 24 hr tablet Take 1 tablet (25 mg total) by mouth daily. TAKE WITH OR IMMEDIATELY FOLLOWING A MEAL. 90 tablet 3   Misc Natural Products (GLUCOS-CHONDROIT-MSM COMPLEX PO) Take 2 tablets by mouth daily.     mometasone (ELOCON) 0.1 % cream Apply topically.     Multiple Vitamin (MULITIVITAMIN WITH MINERALS) TABS Take 1 tablet by mouth daily.     nitroGLYCERIN (NITROSTAT) 0.4 MG SL tablet Place 1 tablet (0.4 mg total) under the tongue every 5 (five) minutes as needed for chest pain up to 3 times. If no relief, call 911. 25 tablet 4   NURTEC 75 MG TBDP Take 1 each by mouth daily as needed. 2 tablet 0   ondansetron (ZOFRAN) 4 MG tablet Take 1 tablet (4 mg total) by mouth every 8 (eight)  hours as needed for nausea or vomiting. 80 tablet 2    ONETOUCH ULTRA test strip USE TO CHECK BLOOD SUGAR 2 TIMES A DAY 100 strip 3   oxyCODONE (ROXICODONE) 15 MG immediate release tablet Take 1 tablet by mouth four times a day 120 tablet 0   potassium chloride (KLOR-CON) 10 MEQ tablet Take 2 tablets (20 mEq total) by mouth every morning. 180 tablet 1   promethazine (PHENERGAN) 25 MG tablet Take 1 tablet (25 mg total) by mouth at bedtime. 90 tablet 3   tiZANidine (ZANAFLEX) 4 MG tablet Take 8 mg by mouth every 8 (eight) hours as needed for muscle spasms.     tiZANidine (ZANAFLEX) 4 MG tablet Take 2 tablets (8 mg total) by mouth 3 (three) times daily. 1134 tablet 0   vitamin B-12 (CYANOCOBALAMIN) 500 MCG tablet Take 500 mcg by mouth daily.      vitamin E 200 UNIT capsule Take 200 Units by mouth daily.     albuterol (PROVENTIL) (2.5 MG/3ML) 0.083% nebulizer solution Take 3 mLs (2.5 mg total) by nebulization every 6 (six) hours as needed for wheezing or shortness of breath (J45.40). 75 mL 3   albuterol (VENTOLIN HFA) 108 (90 Base) MCG/ACT inhaler TAKE 2 PUFFS BY MOUTH EVERY 6 HOURS AS NEEDED FOR WHEEZE OR SHORTNESS OF BREATH 20.1 each 3   budesonide-formoterol (SYMBICORT) 80-4.5 MCG/ACT inhaler Inhale 2 puffs into the lungs every morning and another 2 puffs 12 hours later. 3 each 3   montelukast (SINGULAIR) 10 MG tablet Take 1 tablet (10 mg total) by mouth at bedtime. 90 tablet 0   No facility-administered medications prior to visit.     Review of Systems:   Constitutional:   No  weight loss, night sweats,  Fevers, chills, fatigue, or  lassitude.  HEENT:   No headaches,  Difficulty swallowing,  Tooth/dental problems, or  Sore throat,                No sneezing, itching, ear ache,  +nasal congestion, post nasal drip,   CV:  No chest pain,  Orthopnea, PND, swelling in lower extremities, anasarca, dizziness, palpitations, syncope.   GI  No heartburn, indigestion, abdominal pain, nausea, vomiting, diarrhea, change in bowel habits, loss of appetite,  bloody stools.   Resp:   No chest wall deformity  Skin: no rash or lesions.  GU: no dysuria, change in color of urine, no urgency or frequency.  No flank pain, no hematuria   MS:  No joint pain or swelling.  No decreased range of motion.  No back pain.    Physical Exam  BP 124/82 (BP Location: Right Arm, Patient Position: Sitting, Cuff Size: Large)   Pulse 81   Temp 98.1 F (36.7 C) (Oral)   Ht 5\' 6"  (1.676 m)   Wt 218 lb 6.4 oz (99.1 kg)   SpO2 95%   BMI 35.25 kg/m   GEN: A/Ox3; pleasant , NAD, well nourished    HEENT:  [place]/AT,   NOSE-clear, THROAT-clear, no lesions, no postnasal drip or exudate noted. Poor dentition  NECK:  Supple w/ fair ROM; no JVD; normal carotid impulses w/o bruits; no thyromegaly or nodules palpated; no lymphadenopathy.    RESP  Clear  P & A; w/o, wheezes/ rales/ or rhonchi. no accessory muscle use, no dullness to percussion  CARD:  RRR, no m/r/g, no peripheral edema, pulses intact, no cyanosis or clubbing.  GI:   Soft & nt; nml bowel sounds; no organomegaly or masses  detected.   Musco: Warm bil, no deformities or joint swelling noted.   Neuro: alert, no focal deficits noted.    Skin: Warm, no lesions or rashes    Lab Results:  CBC     Imaging: DG Chest 2 View  Result Date: 07/31/2023 CLINICAL DATA:  Dyspnea. Nausea and vomiting for 3 days. Generalized body aches. Diffuse abdominal pain. EXAM: CHEST - 2 VIEW COMPARISON:  06/12/2022 FINDINGS: Shallow inspiration. Heart size and pulmonary vascularity are normal for technique. Lungs are clear. No pleural effusions. No pneumothorax. Mediastinal contours appear intact. A loop recorder is present. Degenerative changes in the spine and shoulders. Calcification of the aorta. IMPRESSION: No active cardiopulmonary disease. Electronically Signed   By: Burman Nieves M.D.   On: 07/31/2023 19:04   CT CHEST ABDOMEN PELVIS W CONTRAST  Result Date: 07/31/2023 CLINICAL DATA:  Epigastric pain.  Back  pain. EXAM: CT CHEST, ABDOMEN, AND PELVIS WITH CONTRAST TECHNIQUE: Multidetector CT imaging of the chest, abdomen and pelvis was performed following the standard protocol during bolus administration of intravenous contrast. RADIATION DOSE REDUCTION: This exam was performed according to the departmental dose-optimization program which includes automated exposure control, adjustment of the mA and/or kV according to patient size and/or use of iterative reconstruction technique. CONTRAST:  75mL OMNIPAQUE IOHEXOL 350 MG/ML SOLN COMPARISON:  CT angio chest 10/30/2019 FINDINGS: CT CHEST FINDINGS Cardiovascular: Heart size is normal. Coronary artery calcifications are present. No significant pericardial effusion is present. Aortic arch and great vessel origins are within normal limits. Pulmonary arteries are unremarkable. Mediastinum/Nodes: No enlarged mediastinal, hilar, or axillary lymph nodes. Thyroid gland, trachea, and esophagus demonstrate no significant findings. Lungs/Pleura: Lungs are clear. No pleural effusion or pneumothorax. Musculoskeletal: The vertebral body heights and alignment are normal. Alignment is anatomic. Mild rightward curvature is present in the thoracic spine. The ribs are unremarkable. The sternum is intact. Surgical clips are present in the left breast. CT ABDOMEN PELVIS FINDINGS Hepatobiliary: No focal liver abnormality is seen. No gallstones, gallbladder wall thickening, or biliary dilatation. Pancreas: Unremarkable. No pancreatic ductal dilatation or surrounding inflammatory changes. Spleen: Normal in size without focal abnormality. Adrenals/Urinary Tract: Adrenal glands are normal bilaterally. Kidneys are unremarkable. No stone or mass lesion is present. Ureters are normal. No obstruction is present. The urinary bladder is normal, mostly collapsed. Stomach/Bowel: A small hiatal hernia is present. The stomach and duodenum are within normal limits. Small bowel is unremarkable. Terminal ileum is  within normal limits. The appendix is not discretely visualized scratched at the appendix is visualized and normal. The ascending and transverse colon are within normal limits. Descending and sigmoid colon are normal. Vascular/Lymphatic: Atherosclerotic calcifications are present in the aorta and branch vessels. No aneurysm is present. Reproductive: Uterus and bilateral adnexa are unremarkable. Other: Fat herniates into the inguinal canals bilaterally without associated bowel. An 8 mm paraumbilical hernia contains fat without bowel. No other significant ventral hernias are present. No free fluid or free air is present. Musculoskeletal: Mild facet hypertrophy is present in the lower lumbar spine. No focal osseous lesions are present. Bony pelvis is normal. Mild degenerative changes are present in the hips bilaterally, right greater than left. IMPRESSION: 1. No acute or focal lesion to explain the patient's symptoms. 2. Coronary artery disease. 3. Small hiatal hernia. 4. Fat herniates into the inguinal canals bilaterally without associated bowel. 5. Mild degenerative changes in the hips bilaterally, right greater than left. 6.  Aortic Atherosclerosis (ICD10-I70.0). Electronically Signed   By: Virl Son.D.  On: 07/31/2023 18:55    Administration History     None           No data to display          Lab Results  Component Value Date   NITRICOXIDE 8 11/13/2016        Assessment & Plan:   Asthma Mild persistent asthma currently well-controlled on Symbicort and Singulair.  No changes. Flu shot is up-to-date  Plan Patient Instructions  Continue on Symbicort 2 puffs Twice daily  , rinse after use.  Continue on Singulair daily  Albuterol As needed    Continue on CPAP At bedtime   Wear all night long for at least 6hr each day.  Work on [employer] and weight loss.  Do not drive if sleepy   Follow up with Dr. Vassie Loll  In 1 year and As needed   Please contact office for  sooner follow up if symptoms do not improve or worsen or seek emergency care      Allergic rhinitis Continue on Singulair daily.  OSA (obstructive sleep apnea) Excellent control compliance on nocturnal CPAP.  No changes in setting  Plan  Patient Instructions  Continue on Symbicort 2 puffs Twice daily  , rinse after use.  Continue on Singulair daily  Albuterol As needed    Continue on CPAP At bedtime   Wear all night long for at least 6hr each day.  Work on [employer] and weight loss.  Do not drive if sleepy   Follow up with Dr. Vassie Loll  In 1 year and As needed   Please contact office for sooner follow up if symptoms do not improve or worsen or seek emergency care      Acute cystitis Recent admission now resolved     Rubye Oaks, NP 08/22/2023

## 2023-08-22 NOTE — Assessment & Plan Note (Signed)
Continue on Singulair daily

## 2023-08-22 NOTE — Assessment & Plan Note (Signed)
Recent admission now resolved

## 2023-08-22 NOTE — Telephone Encounter (Signed)
Inbound call from patient requesting refill for Nexium sent to [facility] pharmacy.

## 2023-08-22 NOTE — Assessment & Plan Note (Signed)
Mild persistent asthma currently well-controlled on Symbicort and Singulair.  No changes. Flu shot is up-to-date  Plan Patient Instructions  Continue on Symbicort 2 puffs Twice daily  , rinse after use.  Continue on Singulair daily  Albuterol As needed    Continue on CPAP At bedtime   Wear all night long for at least 6hr each day.  Work on [employer] and weight loss.  Do not drive if sleepy   Follow up with Dr. Vassie Loll  In 1 year and As needed   Please contact office for sooner follow up if symptoms do not improve or worsen or seek emergency care

## 2023-08-22 NOTE — Telephone Encounter (Signed)
Patient needs office visit scheduled and then I can refill medicine

## 2023-08-23 ENCOUNTER — Other Ambulatory Visit (HOSPITAL_COMMUNITY): Payer: Self-pay

## 2023-08-23 ENCOUNTER — Other Ambulatory Visit: Payer: Self-pay

## 2023-08-27 ENCOUNTER — Other Ambulatory Visit: Payer: Self-pay

## 2023-08-27 ENCOUNTER — Other Ambulatory Visit: Payer: Self-pay | Admitting: Adult Health

## 2023-08-27 ENCOUNTER — Other Ambulatory Visit (HOSPITAL_COMMUNITY): Payer: Self-pay

## 2023-08-27 MED ORDER — ONDANSETRON HCL 4 MG PO TABS
4.0000 mg | ORAL_TABLET | Freq: Three times a day (TID) | ORAL | 2 refills | Status: DC | PRN
  Filled 2023-08-27: qty 80, 27d supply, fill #0
  Filled 2023-08-27: qty 80, 26d supply, fill #0
  Filled 2023-09-24: qty 80, 27d supply, fill #1
  Filled 2024-06-08: qty 80, 27d supply, fill #2

## 2023-08-27 MED ORDER — ESOMEPRAZOLE MAGNESIUM 40 MG PO CPDR: 40.0000 mg | DELAYED_RELEASE_CAPSULE | Freq: Two times a day (BID) | ORAL | 1 refills | Status: DC

## 2023-08-27 MED ORDER — PROMETHAZINE HCL 25 MG PO TABS
25.0000 mg | ORAL_TABLET | Freq: Every day | ORAL | 1 refills | Status: DC
  Filled 2023-08-27: qty 30, 30d supply, fill #0
  Filled 2023-08-27: qty 90, 90d supply, fill #0
  Filled 2023-08-29: qty 30, 30d supply, fill #0
  Filled 2023-09-24: qty 30, 30d supply, fill #1
  Filled 2023-11-20 – 2023-11-25 (×2): qty 30, 30d supply, fill #2
  Filled 2023-12-02 – 2023-12-19 (×3): qty 30, 30d supply, fill #3
  Filled 2024-01-07 – 2024-01-14 (×2): qty 30, 30d supply, fill #4
  Filled 2024-01-28 – 2024-02-11 (×2): qty 30, 30d supply, fill #5

## 2023-08-27 MED ORDER — FAMOTIDINE 20 MG PO TABS
20.0000 mg | ORAL_TABLET | Freq: Two times a day (BID) | ORAL | 1 refills | Status: DC
  Filled 2023-08-27: qty 180, 90d supply, fill #0
  Filled 2023-08-27: qty 60, 30d supply, fill #0
  Filled 2023-09-24: qty 60, 30d supply, fill #1
  Filled 2023-11-26: qty 60, 30d supply, fill #2
  Filled 2023-12-02 – 2023-12-19 (×4): qty 60, 30d supply, fill #3
  Filled 2024-01-07 – 2024-01-14 (×2): qty 60, 30d supply, fill #4
  Filled 2024-01-28 – 2024-02-11 (×2): qty 60, 30d supply, fill #5

## 2023-08-27 MED FILL — Promethazine HCl Tab 25 MG: ORAL | 30 days supply | Qty: 30 | Fill #0 | Status: CN

## 2023-08-28 ENCOUNTER — Telehealth: Payer: Self-pay | Admitting: Family Medicine

## 2023-08-28 ENCOUNTER — Other Ambulatory Visit (HOSPITAL_COMMUNITY): Payer: Self-pay

## 2023-08-28 ENCOUNTER — Other Ambulatory Visit: Payer: Self-pay

## 2023-08-28 MED ORDER — POTASSIUM CHLORIDE ER 10 MEQ PO TBCR
20.0000 meq | EXTENDED_RELEASE_TABLET | Freq: Every morning | ORAL | 1 refills | Status: DC
  Filled 2023-08-28: qty 180, 90d supply, fill #0
  Filled 2023-10-15: qty 60, 30d supply, fill #0
  Filled 2023-10-15: qty 180, 90d supply, fill #0
  Filled 2023-11-20 – 2023-11-25 (×2): qty 60, 30d supply, fill #1
  Filled 2023-12-02 – 2023-12-19 (×3): qty 60, 30d supply, fill #2
  Filled 2024-01-07 – 2024-01-14 (×2): qty 60, 30d supply, fill #3
  Filled 2024-01-28 – 2024-02-11 (×2): qty 60, 30d supply, fill #4
  Filled 2024-02-17 – 2024-03-10 (×3): qty 60, 30d supply, fill #5

## 2023-08-28 NOTE — Telephone Encounter (Signed)
Prescription Request  08/28/2023  LOV: 08/08/2023  What is the name of the medication or equipment? potassium chloride (KLOR-CON) 10 MEQ tablet  Have you contacted your pharmacy to request a refill? Yes   Which pharmacy would you like this sent to?  [place] [facility] [address] [place] [address] Phone: [phone] Fax: [phone]   Patient notified that their request is being sent to the clinical staff for review and that they should receive a response within 2 business days.   Please advise at Mobile [phone] (mobile)

## 2023-08-28 NOTE — Telephone Encounter (Signed)
Rx done.

## 2023-08-29 ENCOUNTER — Other Ambulatory Visit: Payer: Self-pay

## 2023-08-29 ENCOUNTER — Other Ambulatory Visit (HOSPITAL_COMMUNITY): Payer: Self-pay

## 2023-08-30 ENCOUNTER — Telehealth: Payer: Self-pay | Admitting: Podiatry

## 2023-08-30 ENCOUNTER — Other Ambulatory Visit (HOSPITAL_COMMUNITY): Payer: Self-pay

## 2023-08-30 NOTE — Telephone Encounter (Signed)
Called the pt lmom for the pt to call the office to get scheduled to pick up her shoes w/[employer].

## 2023-09-02 ENCOUNTER — Other Ambulatory Visit (HOSPITAL_COMMUNITY): Payer: Self-pay

## 2023-09-02 ENCOUNTER — Other Ambulatory Visit: Payer: Self-pay

## 2023-09-02 DIAGNOSIS — M47816 Spondylosis without myelopathy or radiculopathy, lumbar region: Secondary | ICD-10-CM | POA: Diagnosis not present

## 2023-09-02 DIAGNOSIS — G4700 Insomnia, unspecified: Secondary | ICD-10-CM | POA: Diagnosis not present

## 2023-09-02 DIAGNOSIS — Z79891 Long term (current) use of opiate analgesic: Secondary | ICD-10-CM | POA: Diagnosis not present

## 2023-09-02 DIAGNOSIS — G894 Chronic pain syndrome: Secondary | ICD-10-CM | POA: Diagnosis not present

## 2023-09-02 MED ORDER — TIZANIDINE HCL 4 MG PO TABS
8.0000 mg | ORAL_TABLET | Freq: Three times a day (TID) | ORAL | 2 refills | Status: DC
  Filled 2023-09-02: qty 180, 30d supply, fill #0
  Filled 2023-10-15 (×2): qty 180, 30d supply, fill #1

## 2023-09-02 MED ORDER — DOXEPIN HCL 50 MG PO CAPS
50.0000 mg | ORAL_CAPSULE | Freq: Every day | ORAL | 1 refills | Status: DC
  Filled 2023-11-26 (×2): qty 30, 30d supply, fill #0
  Filled 2023-11-26: qty 90, 90d supply, fill #0
  Filled 2023-11-26: qty 30, 30d supply, fill #0
  Filled 2023-12-02 – 2023-12-19 (×3): qty 30, 30d supply, fill #1

## 2023-09-09 ENCOUNTER — Other Ambulatory Visit (HOSPITAL_COMMUNITY): Payer: Self-pay

## 2023-09-16 ENCOUNTER — Ambulatory Visit: Payer: Medicare Other

## 2023-09-16 NOTE — Progress Notes (Signed)
Patient walked in to try on shoes  9.5 XWD was too short  Re-ordered same shoe in 10./5 XWD  Will call to set appt when in  Shelby Anderson Cped, CFo, Cfm

## 2023-09-24 ENCOUNTER — Other Ambulatory Visit: Payer: Self-pay | Admitting: Family Medicine

## 2023-09-24 ENCOUNTER — Other Ambulatory Visit (HOSPITAL_COMMUNITY): Payer: Self-pay

## 2023-09-24 ENCOUNTER — Other Ambulatory Visit: Payer: Self-pay

## 2023-09-25 ENCOUNTER — Other Ambulatory Visit (HOSPITAL_COMMUNITY): Payer: Self-pay

## 2023-09-25 ENCOUNTER — Other Ambulatory Visit: Payer: Self-pay

## 2023-09-26 ENCOUNTER — Other Ambulatory Visit: Payer: Self-pay

## 2023-09-27 ENCOUNTER — Ambulatory Visit (INDEPENDENT_AMBULATORY_CARE_PROVIDER_SITE_OTHER): Payer: Medicare Other

## 2023-09-27 ENCOUNTER — Other Ambulatory Visit: Payer: Self-pay

## 2023-09-27 VITALS — BP 120/62 | HR 78 | Temp 98.5°F | Ht 66.0 in | Wt 222.9 lb

## 2023-09-27 DIAGNOSIS — Z0000 Encounter for general adult medical examination without abnormal findings: Secondary | ICD-10-CM | POA: Diagnosis not present

## 2023-09-27 NOTE — Progress Notes (Signed)
Subjective:   Shelby Anderson is a 67 y.o. female who presents for Medicare Annual (Subsequent) preventive examination.  Visit Complete: In person      Objective:    Today's Vitals   09/27/23 1523  BP: 120/62  Pulse: 78  Temp: 98.5 F (36.9 C)  TempSrc: Oral  SpO2: 93%  Weight: 222 lb 14.4 oz (101.1 kg)  Height: 5\' 6"  (1.676 m)   Body mass index is 35.98 kg/m.     09/27/2023    3:55 PM 07/31/2023   11:52 AM 08/07/2022    3:21 PM 02/28/2022   10:29 AM 01/29/2022    1:27 PM 01/08/2022   10:42 AM 07/18/2021    3:40 PM  Advanced Directives  Does Patient Have a Medical Advance Directive? Yes Yes Yes Yes Yes Yes Yes  Type of Estate agent of [place];Living will Living will Healthcare Power of [place];Living will Living will Healthcare Power of [place];Living will Healthcare Power of [place];Living will;Out of facility DNR (pink MOST or yellow form) Healthcare Power of Attorney  Does patient want to make changes to medical advance directive?  No - Patient declined  No - Patient declined No - Patient declined    Copy of Healthcare Power of Attorney in Chart? No - copy requested  No - copy requested  Yes - validated most recent copy scanned in chart (See row information)    Would patient like information on creating a medical advance directive?  No - Patient declined         Current Medications (verified) Outpatient Encounter Medications as of 09/27/2023  Medication Sig   albuterol (PROVENTIL) (2.5 MG/3ML) 0.083% nebulizer solution Inhale 3 mLs (2.5 mg total) by nebulization every 6 (six) hours as needed for wheezing or shortness of breath (J45.40).   albuterol (VENTOLIN HFA) 108 (90 Base) MCG/ACT inhaler Inhale 1-2 puffs into the lungs every 4 (four) hours as needed for wheezing or shortness of breath.   aspirin EC 81 MG tablet Take 81 mg at bedtime by mouth.    atorvastatin (LIPITOR) 80 MG tablet TAKE ONE TABLET BY MOUTH EVERYDAY AT BEDTIME Needs  appointment for further refills   atorvastatin (LIPITOR) 80 MG tablet Take 1 tablet (80 mg total) by mouth at bedtime.   Blood Glucose Monitoring Suppl (ONE TOUCH ULTRA 2) w/Device KIT Use as directed   budesonide-formoterol (SYMBICORT) 80-4.5 MCG/ACT inhaler Inhale 2 puffs into the lungs every morning and another 2 puffs 12 hours later.   buPROPion (WELLBUTRIN XL) 150 MG 24 hr tablet Take 1 tablet (150 mg total) by mouth daily.   buPROPion (WELLBUTRIN XL) 150 MG 24 hr tablet Take 1 tablet (150 mg total) by mouth daily.   Calcium Carbonate-Vitamin D 600-200 MG-UNIT TABS Take 1 tablet by mouth daily.   Cholecalciferol (VITAMIN D3) 50 MCG (2000 UT) capsule Take 2,000 Units by mouth daily.   CINNAMON PO Take 1,000 mg 2 (two) times daily by mouth.   dapagliflozin propanediol (FARXIGA) 10 MG TABS tablet Take 1 tablet (10 mg total) by mouth daily before breakfast.   doxepin (SINEQUAN) 10 MG capsule Take by mouth.   doxepin (SINEQUAN) 50 MG capsule Take 1 capsule (50 mg total) by mouth at bedtime.   doxepin (SINEQUAN) 50 MG capsule Take 1 capsule (50 mg total) by mouth at bedtime.   esomeprazole (NEXIUM) 40 MG capsule Take 1 capsule (40 mg total) by mouth 2 (two) times daily. Office visit for further refills   famotidine (PEPCID) 20  MG tablet TAKE ONE TABLET BY MOUTH TWICE DAILY FOR INDIGESTION / heartburn   famotidine (PEPCID) 20 MG tablet Take 1 tablet (20 mg total) by mouth 2 (two) times daily.   fluticasone (FLONASE) 50 MCG/ACT nasal spray Place 1 spray into both nostrils at bedtime.   furosemide (LASIX) 20 MG tablet Take 1 tablet (20 mg total) by mouth daily as needed for ankle swelling.   glipiZIDE (GLUCOTROL XL) 5 MG 24 hr tablet TAKE ONE TABLET BY MOUTH EVERY MORNING Needs appointment for further refills   glipiZIDE (GLUCOTROL XL) 5 MG 24 hr tablet Take 1 tablet (5 mg total) by mouth every morning.   Glucosamine 750 MG TABS Take 750 mg by mouth 2 (two) times daily.   glucose blood (ONETOUCH  ULTRA) test strip 1 each by Other route 2 (two) times daily.   hydrALAZINE (APRESOLINE) 25 MG tablet TAKE 1 TABLET BY MOUTH DAILY AS NEEDED ( FOR BLOOD PRESSURE >140/90MMHG UP TO THREE TIMES A DAY )   hydrALAZINE (APRESOLINE) 25 MG tablet Take 1 tablet (25 mg total) by mouth daily as needed (for blood pressure >140/79mmHg up to three times daily).   hydrALAZINE (APRESOLINE) 25 MG tablet Take 1 tablet (25 mg total) by mouth daily as needed for blood pressure >140/35mmhg, up to three times a day   isosorbide mononitrate (IMDUR) 60 MG 24 hr tablet TAKE ONE TABLET BY MOUTH EVERY MORNING Needs appointment for further refills   isosorbide mononitrate (IMDUR) 60 MG 24 hr tablet Take 1 tablet (60 mg total) by mouth every morning.   L-Methylfolate (DEPLIN) 7.5 MG TABS Take 7.5 mg by mouth daily with breakfast.   L-Methylfolate 7.5 MG TABS Take 1 tablet (7.5 mg total) by mouth every morning.   Lancets (ONETOUCH DELICA PLUS LANCET30G) MISC USE TO CHECK BLOOD SUGAR 2 TIMES A DAY   levothyroxine (SYNTHROID) 75 MCG tablet TAKE 1 TABLET BY MOUTH BEFORE BREAKFAST   levothyroxine (SYNTHROID) 75 MCG tablet Take 1 tablet (75 mcg total) by mouth daily before breakfast.   lisinopril (ZESTRIL) 5 MG tablet TAKE ONE TABLET BY MOUTH EVERY MORNING   lisinopril (ZESTRIL) 5 MG tablet Take 1 tablet (5 mg total) by mouth every morning.   LYRICA 150 MG capsule Take 150 mg by mouth 3 (three) times daily.    metFORMIN (GLUCOPHAGE) 500 MG tablet TAKE ONE TABLET BY MOUTH TWICE DAILY WITH A MEAL Needs appointment for further refills   metFORMIN (GLUCOPHAGE) 500 MG tablet Take 1 tablet (500 mg total) by mouth 2 (two) times daily with a meal.   metoprolol succinate (TOPROL-XL) 25 MG 24 hr tablet Take 1 tablet (25 mg total) by mouth daily. TAKE WITH OR IMMEDIATELY FOLLOWING A MEAL.   Misc Natural Products (GLUCOS-CHONDROIT-MSM COMPLEX PO) Take 2 tablets by mouth daily.   mometasone (ELOCON) 0.1 % cream Apply topically.   montelukast  (SINGULAIR) 10 MG tablet Take 1 tablet (10 mg total) by mouth at bedtime.   Multiple Vitamin (MULITIVITAMIN WITH MINERALS) TABS Take 1 tablet by mouth daily.   nitroGLYCERIN (NITROSTAT) 0.4 MG SL tablet Place 1 tablet (0.4 mg total) under the tongue every 5 (five) minutes as needed for chest pain up to 3 times. If no relief, call 911.   NURTEC 75 MG TBDP Take 1 each by mouth daily as needed.   ondansetron (ZOFRAN) 4 MG tablet Take 1 tablet (4 mg total) by mouth every 8 (eight) hours as needed for nausea or vomiting.   ONETOUCH ULTRA test strip  USE TO CHECK BLOOD SUGAR 2 TIMES A DAY   oxyCODONE (ROXICODONE) 15 MG immediate release tablet Take 1 tablet by mouth four times a day   potassium chloride (KLOR-CON) 10 MEQ tablet Take 2 tablets (20 mEq total) by mouth every morning.   pregabalin (LYRICA) 150 MG capsule Take 1 capsule (150 mg total) by mouth 3 (three) times daily.   promethazine (PHENERGAN) 25 MG tablet Take 1 tablet (25 mg total) by mouth at bedtime.   tiZANidine (ZANAFLEX) 4 MG tablet Take 8 mg by mouth every 8 (eight) hours as needed for muscle spasms.   tiZANidine (ZANAFLEX) 4 MG tablet Take 2 tablets (8 mg total) by mouth 3 (three) times daily.   tiZANidine (ZANAFLEX) 4 MG tablet Take 2 tablets (8 mg total) by mouth 3 (three) times daily.   vitamin B-12 (CYANOCOBALAMIN) 500 MCG tablet Take 500 mcg by mouth daily.    vitamin E 200 UNIT capsule Take 200 Units by mouth daily.   No facility-administered encounter medications on file as of 09/27/2023.    Allergies (verified) Lodine [etodolac], Oxycontin [oxycodone hcl], Penicillins, Aspirin, Darvocet [propoxyphene n-acetaminophen], Nitroglycerin, Propoxyphene, Tramadol, and Valium   History: Past Medical History:  Diagnosis Date   Allergy    Anemia    "chronic"   Angina    Prinzmetal angina   Anxiety and depression    Arthritis    Asthma    Atrial fibrillation (HCC)    h/o "AF w/frequent PVCs"   Breast cancer (HCC)    Left    Cancer (HCC)    hx of skin cancer    CHF (congestive heart failure) (HCC)    Chronic back pain greater than 3 months duration    on chronic narcotics, treated at pain clinic   Chronic respiratory failure (HCC) 09/15/2015   ONO 09/04/15 + desats >begin O2 at 2l/ m    Colon polyps    hyperplastic   Coronary artery disease    Arrythmia, orthostatic hypotension, HLD, HTN; sees Dr. Jacinto Halim   Difficult intubation    "TMJ & woke up when they were still cutting on me"   Dysrhythmia    sees Dr. 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 asthma   Stenotic cervical os    Stomach ulcer    "3 small; found in 05/2011"  TMJ (dislocation of temporomandibular joint)    Tuberculosis    + TB SKIN TEST   Type 2 diabetes mellitus without complication, without long-term current use of insulin (HCC) 12/06/2015   not on meds    Past Surgical History:  Procedure Laterality Date   ACHILLES TENDON REPAIR  1970's   left ankle   ARTHROSCOPIC REPAIR ACL     left knee cap   BREAST BIOPSY Right  04/08/2013   again in October or November 2020   BREAST LUMPECTOMY Left 12/09/2019   BREAST LUMPECTOMY WITH RADIOACTIVE SEED AND SENTINEL LYMPH NODE BIOPSY Left 12/09/2019   Procedure: LEFT BREAST LUMPECTOMY WITH RADIOACTIVE SEED AND LEFT AXILLARY SENTINEL LYMPH NODE BIOPSY;  Surgeon: Emelia Loron, MD;  Location: [facility] OR;  Service: General;  Laterality: Left;  PEC BLOCK   CARDIAC CATHETERIZATION     loop recorder   CARPAL TUNNEL RELEASE  unknown   left hand   COLONOSCOPY     ESOPHAGOGASTRODUODENOSCOPY (EGD) WITH PROPOFOL N/A 10/29/2017   Procedure: ESOPHAGOGASTRODUODENOSCOPY (EGD) WITH PROPOFOL;  Surgeon: Hilarie Fredrickson, MD;  Location: [facility] ENDOSCOPY;  Service: Endoscopy;  Laterality: N/A;   LOOP RECORDER IMPLANT     PORT-A-CATH REMOVAL N/A 12/09/2019   Procedure: REMOVAL PORT-A-CATH;  Surgeon: Emelia Loron, MD;  Location: [facility] OR;  Service: General;  Laterality: N/A;   PORTACATH PLACEMENT N/A 09/23/2019   Procedure: INSERTION PORT-A-CATH Right Internal Feliberto Harts;  Surgeon: Emelia Loron, MD;  Location: [facility] OR;  Service: General;  Laterality: N/A;   post ganglionectomy  1970's   "for migraine headaches"   pouch string  96,04,54   "did this 3 times (once w/each pregnancy)"   SAVORY DILATION N/A 10/29/2017   Procedure: SAVORY DILATION;  Surgeon: Hilarie Fredrickson, MD;  Location: [facility] ENDOSCOPY;  Service: Endoscopy;  Laterality: N/A;   TOTAL KNEE ARTHROPLASTY Left 09/25/2016   Procedure: LEFT TOTAL KNEE ARTHROPLASTY;  Surgeon: Durene Romans, MD;  Location: [facility] ORS;  Service: Orthopedics;  Laterality: Left;   TUBAL LIGATION  1980's   Family History  Problem Relation Age of Onset   Malignant hyperthermia Father    Hypertension Father    Heart disease Father    Diabetes Father    Cancer Father        skin   Hypertension Mother    Heart disease Mother    Multiple myeloma Mother    Cancer Sister        CERVICAL   Hypertension Sister    Cancer Brother 68       MELANOMA    Heart disease Maternal Grandmother    Other Maternal Grandmother 32       complications of childbirth   Heart disease Maternal Grandfather    Cancer Paternal Grandmother        ?    Heart disease Paternal Grandmother    Heart disease Paternal Grandfather    Cancer Brother        LUNG   Diabetes Sister    Hypertension Sister    Heart disease Sister    Cancer Sister    Cancer Brother    Pancreatic cancer Niece 7   Cancer Nephew 40       unknown- currently in the [employer]   Anesthesia problems Neg Hx    Hypotension Neg Hx    Pseudochol deficiency Neg Hx    Colon cancer Neg Hx    Esophageal cancer Neg Hx    Rectal cancer Neg Hx    Stomach cancer  Neg Hx    Breast cancer Neg Hx    Social History   Socioeconomic History   Marital status: Married    Spouse name: Not on file   Number of children: 3   Years of education: 15   Highest education level: Some college, no degree  Occupational History   Occupation: Retired Charity fundraiser  Tobacco Use   Smoking status: Never   Smokeless tobacco: Never  Vaping Use   Vaping status: Never Used  Substance and Sexual Activity   Alcohol use: No    Alcohol/week: 0.0 standard drinks of alcohol   Drug use: No   Sexual activity: Not Currently    Birth control/protection: Surgical    Comment: 1st intercourse 67 yo-Fewer than 5 partners  Other Topics Concern   Not on file  Social History Narrative   Right handed   One story home   3 children   Has custody of 2 grandchildren ages 77 & 8    Social Determinants of Health   Financial Resource Strain: Low Risk  (09/27/2023)   Overall Financial Resource Strain (CARDIA)    Difficulty of Paying Living Expenses: Not hard at all  Food Insecurity: No Food Insecurity (09/27/2023)   Hunger Vital Sign    Worried About Running Out of Food in the Last Year: Never true    Ran Out of Food in the Last Year: Never true  Transportation Needs: No Transportation Needs (09/27/2023)   PRAPARE - Therapist, art (Medical): No    Lack of Transportation (Non-Medical): No  Physical Activity: Sufficiently Active (09/27/2023)   Exercise Vital Sign    Days of Exercise per Week: 3 days    Minutes of Exercise per Session: 60 min  Stress: Stress Concern Present (09/27/2023)   [employer] of Occupational Health - Occupational Stress Questionnaire    Feeling of Stress : Very much  Social Connections: Socially Integrated (09/27/2023)   Social Connection and Isolation Panel [NHANES]    Frequency of Communication with Friends and Family: More than three times a week    Frequency of Social Gatherings with Friends and Family: More than three times a week    Attends Religious Services: More than 4 times per year    Active Member of [employer] or Organizations: Yes    Attends Engineer, structural: More than 4 times per year    Marital Status: Married    Tobacco Counseling Counseling given: Not Answered   Clinical Intake:  Pre-visit preparation completed: Yes  Pain : No/denies pain     BMI - recorded: 35.98 Nutritional Status: BMI > 30  Obese Nutritional Risks: None Diabetes: Yes CBG done?: Yes (CBG 121 Per patient) CBG resulted in Enter/ Edit results?: Yes Did pt. bring in CBG monitor from home?: No  How often do you need to have someone help you when you read instructions, pamphlets, or other written materials from your doctor or pharmacy?: 3 - Sometimes (Only small print)  Interpreter Needed?: No  Information entered by :: Theresa Mulligan LPN   Activities of Daily Living    09/27/2023    3:50 PM 08/01/2023    8:00 AM  In your present state of health, do you have any difficulty performing the following activities:  Hearing? 0 0  Vision? 0 0  Difficulty concentrating or making decisions? 0 0  Walking or climbing stairs? 1 0  Comment Uses a Cane   Dressing or bathing? 0 0  Doing errands,  shopping? 0 0  Preparing Food and eating ? N   Using the Toilet?  N   In the past six months, have you accidently leaked urine? N   Do you have problems with loss of bowel control? Y   Comment PCP aware   Managing your Medications? N   Managing your Finances? N   Housekeeping or managing your Housekeeping? N     Patient Care Team: Karie Georges, MD as PCP - General (Family Medicine) Oretha Milch, MD as Consulting Physician (Pulmonary Disease) Yates Decamp, MD as Consulting Physician (Cardiology) Orma Flaming, MD as Referring Physician (Obstetrics and Gynecology) Garen Grams, MD as Referring Physician (Cardiology) Thyra Breed, MD as Consulting Physician (Anesthesiology) Emelia Loron, MD as Consulting Physician (General Surgery) Serena Croissant, MD as Consulting Physician (Hematology and Oncology) Dorothy Puffer, MD as Consulting Physician (Radiation Oncology) Glendale Chard, DO as Consulting Physician (Neurology) Donzetta Starch, MD as Consulting Physician (Dermatology) Juanell Fairly, RN as Case Manager Hilarie Fredrickson, MD as Consulting Physician (Gastroenterology) [name], [facility] (Pharmacist)  Indicate any recent Medical Services you may have received from other than [facility] providers in the past year (date may be approximate).     Assessment:   This is a routine wellness examination for Fionnuala.  Hearing/Vision screen Hearing Screening - Comments:: Denies hearing difficulties   Vision Screening - Comments:: Wears reading glasses - up to date with routine eye exams with  Dr Dione Booze   Goals Addressed               This Visit's Progress     Function without issues (pt-stated)         Depression Screen    09/27/2023    3:47 PM 09/27/2023    3:46 PM 08/08/2023    4:13 PM 02/05/2023    4:21 PM 12/17/2022    4:33 PM 09/06/2022    4:07 PM 08/07/2022    3:25 PM  PHQ 2/9 Scores  PHQ - 2 Score 2 0 2 2 1 2 6   PHQ- 9 Score 6 0 6 6  7 13     Fall Risk    09/27/2023    3:53 PM 02/05/2023    3:52 PM 12/17/2022    3:49 PM  08/07/2022    3:22 PM 05/11/2022    1:22 PM  Fall Risk   Falls in the past year? 1 1 1 1 1   Comment    gets dizzy   Number falls in past yr: 0 1 1 1 1   Injury with Fall? 0 1 1 0 1  Risk for fall due to : History of fall(s) No Fall Risks History of fall(s);Impaired mobility;Medication side effect;Impaired balance/gait Medication side effect;History of fall(s) History of fall(s);Orthopedic patient;Impaired balance/gait  Risk for fall due to: Comment   Using and cane    Follow up Falls prevention discussed Falls evaluation completed Falls prevention discussed Falls evaluation completed;Education provided;Falls prevention discussed Falls evaluation completed    MEDICARE RISK AT HOME: Medicare Risk at Home Any stairs in or around the home?: No If so, are there any without handrails?: No Home free of loose throw rugs in walkways, pet beds, electrical cords, etc?: Yes Adequate lighting in your home to reduce risk of falls?: Yes Life alert?: No Use of a cane, walker or w/c?: Yes Grab bars in the bathroom?: Yes Shower chair or bench in shower?: No Elevated toilet seat or a handicapped toilet?: Yes  TIMED UP AND GO:  Was the test performed?  Yes  Length of time to ambulate 10 feet: 10 sec Gait slow and steady without use of assistive device    Cognitive Function:        09/27/2023    3:55 PM 08/07/2022    3:34 PM 07/18/2021    3:47 PM  6CIT Screen  What Year? 0 points 0 points 0 points  What month? 0 points 0 points 0 points  What time? 0 points 0 points 0 points  Count back from 20 0 points 0 points 0 points  Months in reverse 0 points 2 points 0 points  Repeat phrase 0 points 2 points 0 points  Total Score 0 points 4 points 0 points    Immunizations Immunization History  Administered Date(s) Administered   Fluad Trivalent(High Dose 65+) 08/08/2023   Influenza Split 08/19/2011   Influenza Whole 01/03/2013   Influenza,inj,Quad PF,6+ Mos 08/20/2013, 09/15/2014, 08/26/2015,  08/01/2016, 08/22/2017, 10/15/2018, 09/24/2019, 12/07/2020, 02/05/2022, 09/06/2022   Influenza-Unspecified 08/03/2016   Moderna Sars-Covid-2 Vaccination 03/11/2020, 04/08/2020, 08/03/2020   PNEUMOCOCCAL CONJUGATE-20 02/05/2022   Pneumococcal Conjugate-13 02/24/2021   Pneumococcal Polysaccharide-23 08/19/2011, 12/12/2017   Tdap 06/28/2014   Zoster Recombinant(Shingrix) 09/03/2022, 11/04/2022    TDAP status: Up to date  Flu Vaccine status: Up to date  Pneumococcal vaccine status: Up to date  Covid-19 vaccine status: Declined, Education has been provided regarding the importance of this vaccine but patient still declined. Advised may receive this vaccine at local pharmacy or Health Dept.or vaccine clinic. Aware to provide a copy of the vaccination record if obtained from local pharmacy or Health Dept. Verbalized acceptance and understanding.  Qualifies for Shingles Vaccine? Yes   Zostavax completed Yes   Shingrix Completed?: Yes  Screening Tests Health Maintenance  Topic Date Due   Cervical Cancer Screening (HPV/Pap Cotest)  08/21/2021   Diabetic kidney evaluation - Urine ACR  01/25/2023   COVID-19 Vaccine (4 - 2023-24 season) 08/04/2023   HEMOGLOBIN A1C  02/05/2024   OPHTHALMOLOGY EXAM  04/22/2024   FOOT EXAM  04/23/2024   DTaP/Tdap/Td (2 - Td or Tdap) 06/28/2024   Diabetic kidney evaluation - eGFR measurement  07/31/2024   Medicare Annual Wellness (AWV)  09/26/2024   MAMMOGRAM  08/20/2025   Colonoscopy  07/11/2031   Pneumonia Vaccine 46+ Years old  Completed   INFLUENZA VACCINE  Completed   DEXA SCAN  Completed   Hepatitis C Screening  Completed   HIV Screening  Completed   Zoster Vaccines- Shingrix  Completed   HPV VACCINES  Aged Out    Health Maintenance  Health Maintenance Due  Topic Date Due   Cervical Cancer Screening (HPV/Pap Cotest)  08/21/2021   Diabetic kidney evaluation - Urine ACR  01/25/2023   COVID-19 Vaccine (4 - 2023-24 season) 08/04/2023     Colorectal cancer screening: Type of screening: Colonoscopy. Completed 07/10/21. Repeat every 10 years  Mammogram status: Completed 08/21/23. Repeat every year  Bone Density status: Completed 01/28/23. Results reflect: Bone density results: OSTEOPOROSIS. Repeat every   years.      Additional Screening:  Hepatitis C Screening: does qualify; Completed 11/03/15  Vision Screening: Recommended annual ophthalmology exams for early detection of glaucoma and other disorders of the eye. Is the patient up to date with their annual eye exam?  Yes  Who is the provider or what is the name of the office in which the patient attends annual eye exams? Dr Dione Booze If pt is not established with a provider, would they like to  be referred to a provider to establish care? No .   Dental Screening: Recommended annual dental exams for proper oral hygiene  Diabetic Foot Exam: Diabetic Foot Exam: Completed 04/24/23  Community Resource Referral / Chronic Care Management:  CRR required this visit?  No   CCM required this visit?  No     Plan:     I have personally reviewed and noted the following in the patient's chart:   Medical and social history Use of alcohol, tobacco or illicit drugs  Current medications and supplements including opioid prescriptions. Patient is currently taking opioid prescriptions. Information provided to patient regarding non-opioid alternatives. Patient advised to discuss non-opioid treatment plan with their provider. Functional ability and status Nutritional status Physical activity Advanced directives List of other physicians Hospitalizations, surgeries, and ER visits in previous 12 months Vitals Screenings to include cognitive, depression, and falls Referrals and appointments  In addition, I have reviewed and discussed with patient certain preventive protocols, quality metrics, and best practice recommendations. A written personalized care plan for preventive services as  well as general preventive health recommendations were provided to patient.     Tillie Rung, LPN   [id number]   After Visit Summary: Given  Nurse Notes: None

## 2023-09-27 NOTE — Patient Instructions (Addendum)
Shelby Anderson , Thank you for taking time to come for your Medicare Wellness Visit. I appreciate your ongoing commitment to your health goals. Please review the following plan we discussed and let me know if I can assist you in the future.   Referrals/Orders/Follow-Ups/Clinician Recommendations:   This is a list of the screening recommended for you and due dates:  Health Maintenance  Topic Date Due   Pap with HPV screening  08/21/2021   Yearly kidney health urinalysis for diabetes  01/25/2023   COVID-19 Vaccine (4 - 2023-24 season) 08/04/2023   Hemoglobin A1C  02/05/2024   Eye exam for diabetics  04/22/2024   Complete foot exam   04/23/2024   DTaP/Tdap/Td vaccine (2 - Td or Tdap) 06/28/2024   Yearly kidney function blood test for diabetes  07/31/2024   Medicare Annual Wellness Visit  09/26/2024   Mammogram  08/20/2025   Colon Cancer Screening  07/11/2031   Pneumonia Vaccine  Completed   Flu Shot  Completed   DEXA scan (bone density measurement)  Completed   Hepatitis C Screening  Completed   HIV Screening  Completed   Zoster (Shingles) Vaccine  Completed   HPV Vaccine  Aged Out   Opioid Pain Medicine Management Opioids are powerful medicines that are used to treat moderate to severe pain. When used for short periods of time, they can help you to: Sleep better. Do better in physical or occupational therapy. Feel better in the first few days after an injury. Recover from surgery. Opioids should be taken with the supervision of a trained health care provider. They should be taken for the shortest period of time possible. This is because opioids can be addictive, and the longer you take opioids, the greater your risk of addiction. This addiction can also be called opioid use disorder. What are the risks? Using opioid pain medicines for longer than 3 days increases your risk of side effects. Side effects include: Constipation. Nausea and vomiting. Breathing difficulties  (respiratory depression). Drowsiness. Confusion. Opioid use disorder. Itching. Taking opioid pain medicine for a long period of time can affect your ability to do daily tasks. It also puts you at risk for: Motor vehicle crashes. Depression. Suicide. Heart attack. Overdose, which can be life-threatening. What is a pain treatment plan? A pain treatment plan is an agreement between you and your health care provider. Pain is unique to each person, and treatments vary depending on your condition. To manage your pain, you and your health care provider need to work together. To help you do this: Discuss the goals of your treatment, including how much pain you might expect to have and how you will manage the pain. Review the risks and benefits of taking opioid medicines. Remember that a good treatment plan uses more than one approach and minimizes the chance of side effects. Be honest about the amount of medicines you take and about any drug or alcohol use. Get pain medicine prescriptions from only one health care provider. Pain can be managed with many types of alternative treatments. Ask your health care provider to refer you to one or more specialists who can help you manage pain through: Physical or occupational therapy. Counseling (cognitive behavioral therapy). Good nutrition. Biofeedback. Massage. Meditation. Non-opioid medicine. Following a gentle exercise program. How to use opioid pain medicine Taking medicine Take your pain medicine exactly as told by your health care provider. Take it only when you need it. If your pain gets less severe, you may take less  than your prescribed dose if your health care provider approves. If you are not having pain, do nottake pain medicine unless your health care provider tells you to take it. If your pain is severe, do nottry to treat it yourself by taking more pills than instructed on your prescription. Contact your health care provider for  help. Write down the times when you take your pain medicine. It is easy to become confused while on pain medicine. Writing the time can help you avoid overdose. Take other over-the-counter or prescription medicines only as told by your health care provider. Keeping yourself and others safe  While you are taking opioid pain medicine: Do not drive, use machinery, or power tools. Do not sign legal documents. Do not drink alcohol. Do not take sleeping pills. Do not supervise children by yourself. Do not do activities that require climbing or being in high places. Do not go to a lake, river, ocean, spa, or swimming pool. Do not share your pain medicine with anyone. Keep pain medicine in a locked cabinet or in a secure area where pets and children cannot reach it. Stopping your use of opioids If you have been taking opioid medicine for more than a few weeks, you may need to slowly decrease (taper) how much you take until you stop completely. Tapering your use of opioids can decrease your risk of symptoms of withdrawal, such as: Pain and cramping in the abdomen. Nausea. Sweating. Sleepiness. Restlessness. Uncontrollable shaking (tremors). Cravings for the medicine. Do not attempt to taper your use of opioids on your own. Talk with your health care provider about how to do this. Your health care provider may prescribe a step-down schedule based on how much medicine you are taking and how long you have been taking it. Getting rid of leftover pills Do not save any leftover pills. Get rid of leftover pills safely by: Taking the medicine to a prescription take-back program. This is usually offered by the county or law enforcement. Bringing them to a pharmacy that has a drug disposal container. Flushing them down the toilet. Check the label or package insert of your medicine to see whether this is safe to do. Throwing them out in the trash. Check the label or package insert of your medicine to see  whether this is safe to do. If it is safe to throw it out, remove the medicine from the original container, put it into a sealable bag or container, and mix it with used coffee grounds, food scraps, dirt, or cat litter before putting it in the trash. Follow these instructions at home: Activity Do exercises as told by your health care provider. Avoid activities that make your pain worse. Return to your normal activities as told by your health care provider. Ask your health care provider what activities are safe for you. General instructions You may need to take these actions to prevent or treat constipation: Drink enough fluid to keep your urine pale yellow. Take over-the-counter or prescription medicines. Eat foods that are high in fiber, such as beans, whole grains, and fresh fruits and vegetables. Limit foods that are high in fat and processed sugars, such as fried or sweet foods. Keep all follow-up visits. This is important. Where to find support If you have been taking opioids for a long time, you may benefit from receiving support for quitting from a local support group or counselor. Ask your health care provider for a referral to these resources in your area. Where to find more  information Centers for Disease Control and Prevention (CDC): FootballExhibition.com.br [place] Food and Drug Administration (FDA): PumpkinSearch.com.ee Get help right away if: You may have taken too much of an opioid (overdosed). Common symptoms of an overdose: Your breathing is slower or more shallow than normal. You have a very slow heartbeat (pulse). You have slurred speech. You have nausea and vomiting. Your pupils become very small. You have other potential symptoms: You are very confused. You faint or feel like you will faint. You have cold, clammy skin. You have blue lips or fingernails. You have thoughts of harming yourself or harming others. These symptoms may represent a serious problem that is an emergency. Do not wait  to see if the symptoms will go away. Get medical help right away. Call your local emergency services (911 in the [place] Do not drive yourself to the hospital.  If you ever feel like you may hurt yourself or others, or have thoughts about taking your own life, get help right away. Go to your nearest emergency department or: Call your local emergency services (911 in the [place] Call the [facility] ((905) 849-8729 in the [place] Call a suicide crisis helpline, such as the National Suicide Prevention Lifeline at (715)133-9132 or 988 in the [place] This is open 24 hours a day in the [place] Text the Crisis Text Line at (256) 160-9391 (in the [place] Summary Opioid medicines can help you manage moderate to severe pain for a short period of time. A pain treatment plan is an agreement between you and your health care provider. Discuss the goals of your treatment, including how much pain you might expect to have and how you will manage the pain. If you think that you or someone else may have taken too much of an opioid, get medical help right away. This information is not intended to replace advice given to you by your health care provider. Make sure you discuss any questions you have with your health care provider. Document Revised: 06/14/2021 Document Reviewed: 03/01/2021 Elsevier Patient Education  2024 [employer]  Advanced directives: (Copy Requested) Please bring a copy of your health care power of attorney and living will to the office to be added to your chart at your convenience.  Next Medicare Annual Wellness Visit scheduled for next year: Yes

## 2023-09-30 ENCOUNTER — Other Ambulatory Visit: Payer: Self-pay | Admitting: Family Medicine

## 2023-09-30 ENCOUNTER — Other Ambulatory Visit: Payer: Self-pay

## 2023-10-01 ENCOUNTER — Other Ambulatory Visit: Payer: Self-pay

## 2023-10-02 ENCOUNTER — Other Ambulatory Visit: Payer: Self-pay

## 2023-10-02 ENCOUNTER — Other Ambulatory Visit (HOSPITAL_COMMUNITY): Payer: Self-pay

## 2023-10-02 DIAGNOSIS — G894 Chronic pain syndrome: Secondary | ICD-10-CM | POA: Diagnosis not present

## 2023-10-02 DIAGNOSIS — M47816 Spondylosis without myelopathy or radiculopathy, lumbar region: Secondary | ICD-10-CM | POA: Diagnosis not present

## 2023-10-02 DIAGNOSIS — G4700 Insomnia, unspecified: Secondary | ICD-10-CM | POA: Diagnosis not present

## 2023-10-02 DIAGNOSIS — Z79891 Long term (current) use of opiate analgesic: Secondary | ICD-10-CM | POA: Diagnosis not present

## 2023-10-04 ENCOUNTER — Other Ambulatory Visit: Payer: Self-pay

## 2023-10-04 ENCOUNTER — Other Ambulatory Visit (HOSPITAL_COMMUNITY): Payer: Self-pay

## 2023-10-04 DIAGNOSIS — E119 Type 2 diabetes mellitus without complications: Secondary | ICD-10-CM

## 2023-10-04 DIAGNOSIS — F339 Major depressive disorder, recurrent, unspecified: Secondary | ICD-10-CM

## 2023-10-04 MED ORDER — BUPROPION HCL ER (XL) 150 MG PO TB24
150.0000 mg | ORAL_TABLET | Freq: Every day | ORAL | 1 refills | Status: DC
  Filled 2023-10-04: qty 30, 30d supply, fill #0
  Filled 2023-10-04: qty 90, 90d supply, fill #0

## 2023-10-04 MED ORDER — LISINOPRIL 5 MG PO TABS
5.0000 mg | ORAL_TABLET | Freq: Every morning | ORAL | 1 refills | Status: DC
  Filled 2023-10-04: qty 90, 90d supply, fill #0
  Filled 2023-10-04: qty 30, 30d supply, fill #0

## 2023-10-15 ENCOUNTER — Other Ambulatory Visit (HOSPITAL_COMMUNITY): Payer: Self-pay

## 2023-10-15 ENCOUNTER — Other Ambulatory Visit: Payer: Self-pay

## 2023-10-17 ENCOUNTER — Other Ambulatory Visit (HOSPITAL_COMMUNITY): Payer: Self-pay

## 2023-10-18 ENCOUNTER — Other Ambulatory Visit: Payer: Self-pay

## 2023-10-18 ENCOUNTER — Other Ambulatory Visit (HOSPITAL_COMMUNITY): Payer: Self-pay

## 2023-10-23 ENCOUNTER — Other Ambulatory Visit (HOSPITAL_COMMUNITY): Payer: Self-pay

## 2023-10-30 ENCOUNTER — Other Ambulatory Visit: Payer: Self-pay

## 2023-10-30 ENCOUNTER — Other Ambulatory Visit (HOSPITAL_COMMUNITY): Payer: Self-pay

## 2023-10-30 DIAGNOSIS — Z79891 Long term (current) use of opiate analgesic: Secondary | ICD-10-CM | POA: Diagnosis not present

## 2023-10-30 DIAGNOSIS — G894 Chronic pain syndrome: Secondary | ICD-10-CM | POA: Diagnosis not present

## 2023-10-30 DIAGNOSIS — M47816 Spondylosis without myelopathy or radiculopathy, lumbar region: Secondary | ICD-10-CM | POA: Diagnosis not present

## 2023-10-30 DIAGNOSIS — G4700 Insomnia, unspecified: Secondary | ICD-10-CM | POA: Diagnosis not present

## 2023-10-30 MED ORDER — PREGABALIN 150 MG PO CAPS
150.0000 mg | ORAL_CAPSULE | Freq: Three times a day (TID) | ORAL | 2 refills | Status: DC
  Filled 2023-11-25: qty 90, 30d supply, fill #0
  Filled 2023-12-02 – 2023-12-23 (×4): qty 90, 30d supply, fill #1

## 2023-11-04 NOTE — Patient Outreach (Signed)
  Care Management   Visit Note   Name: ROGENA GOLLEHON MRN: [id number] DOB: 1956/10/26  Subjective: JAILYN HOFSTAD is a 66 y.o. year old female who is a primary care patient of Karie Georges, MD. The Care Management team was consulted for assistance.      Outreach with [name] regarding care management services. Agreeable to completing outreach on November 15, 2023. Agreed to call or contact the clinic if she requires assistance prior to the scheduled outreach.   PLAN Appointment scheduled for 11/15/23 at 2pm.   [facility]  [facility] RN Care Manager Direct Dial: [phone] Website: [email or web address]

## 2023-11-08 ENCOUNTER — Ambulatory Visit: Payer: Medicare Other | Admitting: Gastroenterology

## 2023-11-08 ENCOUNTER — Encounter: Payer: Self-pay | Admitting: Gastroenterology

## 2023-11-08 ENCOUNTER — Other Ambulatory Visit (INDEPENDENT_AMBULATORY_CARE_PROVIDER_SITE_OTHER): Payer: Medicare Other

## 2023-11-08 VITALS — BP 118/68 | HR 86 | Ht 66.0 in | Wt 213.0 lb

## 2023-11-08 DIAGNOSIS — K219 Gastro-esophageal reflux disease without esophagitis: Secondary | ICD-10-CM | POA: Diagnosis not present

## 2023-11-08 DIAGNOSIS — R197 Diarrhea, unspecified: Secondary | ICD-10-CM

## 2023-11-08 DIAGNOSIS — R112 Nausea with vomiting, unspecified: Secondary | ICD-10-CM | POA: Diagnosis not present

## 2023-11-08 DIAGNOSIS — R1310 Dysphagia, unspecified: Secondary | ICD-10-CM | POA: Diagnosis not present

## 2023-11-08 DIAGNOSIS — Z8719 Personal history of other diseases of the digestive system: Secondary | ICD-10-CM

## 2023-11-08 LAB — URINALYSIS, ROUTINE W REFLEX MICROSCOPIC
Bilirubin Urine: NEGATIVE
Hgb urine dipstick: NEGATIVE
Ketones, ur: 15 — AB
Leukocytes,Ua: NEGATIVE
Nitrite: NEGATIVE
Specific Gravity, Urine: 1.02 (ref 1.000–1.030)
Total Protein, Urine: NEGATIVE
Urine Glucose: >=1000 — AB
Urobilinogen, UA: 0.2 (ref 0.0–1.0)
pH: 6 (ref 5.0–8.0)

## 2023-11-08 MED ORDER — FLUCONAZOLE 100 MG PO TABS
100.0000 mg | ORAL_TABLET | Freq: Every day | ORAL | 0 refills | Status: DC
Start: 2023-11-08 — End: 2024-01-06

## 2023-11-08 NOTE — Patient Instructions (Signed)
Your provider has requested that you go to the basement level for lab work before leaving today. Press "B" on the elevator. The lab is located at the first door on the left as you exit the elevator.  Take Benefiber - 2 tsp in 6-8 ounces of water daily.  If diarrhea returns, complete a food/stool diary.  _______________________________________________________  If your blood pressure at your visit was 140/90 or greater, please contact your primary care physician to follow up on this.  _______________________________________________________  If you are age 65 or older, your body mass index should be between 23-30. Your Body mass index is 34.38 kg/m. If this is out of the aforementioned range listed, please consider follow up with your Primary Care Provider.  If you are age 4 or younger, your body mass index should be between 19-25. Your Body mass index is 34.38 kg/m. If this is out of the aformentioned range listed, please consider follow up with your Primary Care Provider.   ________________________________________________________  The [facility] providers would like to encourage you to use [facility] to communicate with providers for non-urgent requests or questions.  Due to long hold times on the telephone, sending your provider a message by [facility] may be a faster and more efficient way to get a response.  Please allow 48 business hours for a response.  Please remember that this is for non-urgent requests.  _______________________________________________________

## 2023-11-08 NOTE — Progress Notes (Signed)
11/08/2023 Shelby Anderson [id number] 1956-01-03   HISTORY OF PRESENT ILLNESS:  This is a 67 year old female known to Dr. Marina Goodell with a past medical history significant for diabetes, GERD complicated by peptic stricture. hyperlipidemia, left breast cancer October 2022 s/p lumpectomy and chemoradiation.  She is on multiple medications and has several medication allergies  See PMH below for any additional medical problems.  She is here today to get her Nexium refilled.  She has not been seen in the office since 2022.  Also had EGD and colonoscopy in 2022.  She is on Nexium 40 mg twice daily and Pepcid 20 mg twice daily as well.  She also has issues with chronic nausea and takes Zofran and Phenergan for that.  She was hospitalized in August for intractable nausea and vomiting and was treated for a UTI.  She says that now for the past couple of days she has been having issues with vomiting again.  She also mentions that she has been having issues with intermittent diarrhea.  She says that overall she seems to be moving her bowels better on a more regular basis, but will have random episodes of sudden onset diarrhea.  She says that she is afraid to go anywhere because she does not know when it is going to happen.  She says that she has tried cutting out dairy and different things in her diet.  Has not had any episodes of this in the past 2 to 3 weeks now though.  Some recurrent issues with swallowing solids and liquids, but not really severe at this time.   Past Medical History:  Diagnosis Date   Allergy    Anemia    "chronic"   Angina    Prinzmetal angina   Anxiety and depression    Arthritis    Asthma    Atrial fibrillation (HCC)    h/o "AF w/frequent PVCs"   Breast cancer (HCC)    Left   Cancer (HCC)    hx of skin cancer    CHF (congestive heart failure) (HCC)    Chronic back pain greater than 3 months duration    on chronic narcotics, treated at pain clinic   Chronic respiratory  failure (HCC) 09/15/2015   ONO 09/04/15 + desats >begin O2 at 2l/ m    Colon polyps    hyperplastic   Coronary artery disease    Arrythmia, orthostatic hypotension, HLD, HTN; sees Dr. Jacinto Halim   Difficult intubation    "TMJ & woke up when they were still cutting on me"   Dysrhythmia    sees Dr. 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 Jacinto Halim   OSA (obstructive sleep apnea)  Personal history of chemotherapy    Personal history of radiation therapy    Pneumonia    multiple times   PONV (postoperative nausea and vomiting)    Recurrent upper respiratory infection (URI)    S/P left TKA 09/25/2016   Shortness of breath 11/20/11   "all the time", sees pulmonlogy, ? asthma   Stenotic cervical os    Stomach ulcer    "3 small; found in 05/2011"   TMJ (dislocation of temporomandibular joint)    Tuberculosis    + TB SKIN TEST   Type 2 diabetes mellitus without complication, without long-term current use of insulin (HCC) 12/06/2015   not on meds    Past Surgical History:  Procedure Laterality Date   ACHILLES TENDON REPAIR  1970's   left ankle   ARTHROSCOPIC REPAIR ACL     left knee cap   BREAST BIOPSY Right 04/08/2013   again in October or November 2020   BREAST LUMPECTOMY Left 12/09/2019   BREAST LUMPECTOMY WITH RADIOACTIVE SEED AND SENTINEL LYMPH NODE BIOPSY Left 12/09/2019   Procedure: LEFT BREAST  LUMPECTOMY WITH RADIOACTIVE SEED AND LEFT AXILLARY SENTINEL LYMPH NODE BIOPSY;  Surgeon: Emelia Loron, MD;  Location: [facility] OR;  Service: General;  Laterality: Left;  PEC BLOCK   CARDIAC CATHETERIZATION     loop recorder   CARPAL TUNNEL RELEASE  unknown   left hand   COLONOSCOPY     ESOPHAGOGASTRODUODENOSCOPY (EGD) WITH PROPOFOL N/A 10/29/2017   Procedure: ESOPHAGOGASTRODUODENOSCOPY (EGD) WITH PROPOFOL;  Surgeon: Hilarie Fredrickson, MD;  Location: [facility] ENDOSCOPY;  Service: Endoscopy;  Laterality: N/A;   LOOP RECORDER IMPLANT     PORT-A-CATH REMOVAL N/A 12/09/2019   Procedure: REMOVAL PORT-A-CATH;  Surgeon: Emelia Loron, MD;  Location: [facility] OR;  Service: General;  Laterality: N/A;   PORTACATH PLACEMENT N/A 09/23/2019   Procedure: INSERTION PORT-A-CATH Right Internal Feliberto Harts;  Surgeon: Emelia Loron, MD;  Location[facility] OR;  Service: General;  Laterality: N/A;   post ganglionectomy  1970's   "for migraine headaches"   pouch string  95,18,84   "did this 3 times (once w/each pregnancy)"   SAVORY DILATION N/A 10/29/2017   Procedure: SAVORY DILATION;  Surgeon: Hilarie Fredrickson, MD;  Location: [facility] ENDOSCOPY;  Service: Endoscopy;  Laterality: N/A;   TOTAL KNEE ARTHROPLASTY Left 09/25/2016   Procedure: LEFT TOTAL KNEE ARTHROPLASTY;  Surgeon: Durene Romans, MD;  Location: [facility] ORS;  Service: Orthopedics;  Laterality: Left;   TUBAL LIGATION  1980's    reports that she has never smoked. She has never used smokeless tobacco. She reports that she does not drink alcohol and does not use drugs. family history includes Cancer in her brother, brother, father, paternal grandmother, sister, and sister; Cancer (age of onset: 55) in her brother; Cancer (age of onset: 58) in her nephew; Diabetes in her father and sister; Heart disease in her father, maternal grandfather, maternal grandmother, mother, paternal grandfather, paternal grandmother, and sister; Hypertension in her father, mother, sister, and  sister; Malignant hyperthermia in her father; Multiple myeloma in her mother; Other (age of onset: 47) in her maternal grandmother; Pancreatic cancer (age of onset: 35) in her niece. Allergies  Allergen Reactions   Lodine [Etodolac] Anaphylaxis, Hives and Swelling   Oxycontin [Oxycodone Hcl] Anaphylaxis    hives, trouble breathing, tongue swelling (Only Oxycontin) Tolerates plain oxycodone.   Penicillins Anaphylaxis    Told by a surgeon never to take it again. Has patient had a PCN reaction causing immediate rash, facial/tongue/throat swelling,  SOB or lightheadedness with hypotension: Yes Has patient had a PCN reaction causing severe rash involving mucus membranes or skin necrosis: Unknown Has patient had a PCN reaction that required hospitalization: No Has patient had a PCN reaction occurring within the last 10 years: No If all of the above answers are "NO", then may proceed with Cephalosporin use.   Aspirin Other (See Comments)    High-dose caused GI Bleeds   Darvocet [Propoxyphene N-Acetaminophen] Hives   Nitroglycerin Other (See Comments)    IV-BP drops dramatically Can take po   Propoxyphene Hives   Tramadol Hives and Itching   Valium Other (See Comments)    Circulation problems. "Legs turned black".      Outpatient Encounter Medications as of 11/08/2023  Medication Sig   albuterol (PROVENTIL) (2.5 MG/3ML) 0.083% nebulizer solution Inhale 3 mLs (2.5 mg total) by nebulization every 6 (six) hours as needed for wheezing or shortness of breath (J45.40).   albuterol (VENTOLIN HFA) 108 (90 Base) MCG/ACT inhaler Inhale 1-2 puffs into the lungs every 4 (four) hours as needed for wheezing or shortness of breath.   aspirin EC 81 MG tablet Take 81 mg at bedtime by mouth.    atorvastatin (LIPITOR) 80 MG tablet TAKE ONE TABLET BY MOUTH EVERYDAY AT BEDTIME Needs appointment for further refills   atorvastatin (LIPITOR) 80 MG tablet Take 1 tablet (80 mg total) by mouth at bedtime.   Blood  Glucose Monitoring Suppl (ONE TOUCH ULTRA 2) w/Device KIT Use as directed   budesonide-formoterol (SYMBICORT) 80-4.5 MCG/ACT inhaler Inhale 2 puffs into the lungs every morning and another 2 puffs 12 hours later.   buPROPion (WELLBUTRIN XL) 150 MG 24 hr tablet Take 1 tablet (150 mg total) by mouth daily.   buPROPion (WELLBUTRIN XL) 150 MG 24 hr tablet Take 1 tablet (150 mg total) by mouth daily.   Calcium Carbonate-Vitamin D 600-200 MG-UNIT TABS Take 1 tablet by mouth daily.   Cholecalciferol (VITAMIN D3) 50 MCG (2000 UT) capsule Take 2,000 Units by mouth daily.   CINNAMON PO Take 1,000 mg 2 (two) times daily by mouth.   dapagliflozin propanediol (FARXIGA) 10 MG TABS tablet Take 1 tablet (10 mg total) by mouth daily before breakfast.   doxepin (SINEQUAN) 10 MG capsule Take by mouth.   doxepin (SINEQUAN) 50 MG capsule Take 1 capsule (50 mg total) by mouth at bedtime.   doxepin (SINEQUAN) 50 MG capsule Take 1 capsule (50 mg total) by mouth at bedtime.   esomeprazole (NEXIUM) 40 MG capsule Take 1 capsule (40 mg total) by mouth 2 (two) times daily. Office visit for further refills   famotidine (PEPCID) 20 MG tablet TAKE ONE TABLET BY MOUTH TWICE DAILY FOR INDIGESTION / heartburn   famotidine (PEPCID) 20 MG tablet Take 1 tablet (20 mg total) by mouth 2 (two) times daily.   fluticasone (FLONASE) 50 MCG/ACT nasal spray Place 1 spray into both nostrils at bedtime.   furosemide (LASIX) 20 MG tablet Take 1 tablet (20 mg total) by mouth daily as needed for ankle swelling.   glipiZIDE (GLUCOTROL XL) 5 MG 24 hr tablet TAKE ONE TABLET BY MOUTH EVERY MORNING Needs appointment for further refills   glipiZIDE (GLUCOTROL XL) 5 MG 24 hr tablet Take 1 tablet (5 mg total) by mouth every morning.   Glucosamine 750 MG TABS Take 750 mg by mouth 2 (two) times daily.   glucose blood (ONETOUCH ULTRA) test strip 1 each by Other route 2 (two) times daily.   hydrALAZINE (APRESOLINE)  25 MG tablet TAKE 1 TABLET BY MOUTH DAILY  AS NEEDED ( FOR BLOOD PRESSURE >140/90MMHG UP TO THREE TIMES A DAY )   hydrALAZINE (APRESOLINE) 25 MG tablet Take 1 tablet (25 mg total) by mouth daily as needed (for blood pressure >140/72mmHg up to three times daily).   hydrALAZINE (APRESOLINE) 25 MG tablet Take 1 tablet (25 mg total) by mouth daily as needed for blood pressure >140/26mmhg, up to three times a day   isosorbide mononitrate (IMDUR) 60 MG 24 hr tablet TAKE ONE TABLET BY MOUTH EVERY MORNING Needs appointment for further refills   isosorbide mononitrate (IMDUR) 60 MG 24 hr tablet Take 1 tablet (60 mg total) by mouth every morning.   L-Methylfolate (DEPLIN) 7.5 MG TABS Take 7.5 mg by mouth daily with breakfast.   L-Methylfolate 7.5 MG TABS Take 1 tablet (7.5 mg total) by mouth every morning.   Lancets (ONETOUCH DELICA PLUS LANCET30G) MISC USE TO CHECK BLOOD SUGAR 2 TIMES A DAY   levothyroxine (SYNTHROID) 75 MCG tablet TAKE 1 TABLET BY MOUTH BEFORE BREAKFAST   levothyroxine (SYNTHROID) 75 MCG tablet Take 1 tablet (75 mcg total) by mouth daily before breakfast.   lisinopril (ZESTRIL) 5 MG tablet Take 1 tablet (5 mg total) by mouth every morning.   lisinopril (ZESTRIL) 5 MG tablet Take 1 tablet (5 mg total) by mouth every morning.   LYRICA 150 MG capsule Take 150 mg by mouth 3 (three) times daily.    metFORMIN (GLUCOPHAGE) 500 MG tablet TAKE ONE TABLET BY MOUTH TWICE DAILY WITH A MEAL Needs appointment for further refills   metFORMIN (GLUCOPHAGE) 500 MG tablet Take 1 tablet (500 mg total) by mouth 2 (two) times daily with a meal.   metoprolol succinate (TOPROL-XL) 25 MG 24 hr tablet Take 1 tablet (25 mg total) by mouth daily. TAKE WITH OR IMMEDIATELY FOLLOWING A MEAL.   Misc Natural Products (GLUCOS-CHONDROIT-MSM COMPLEX PO) Take 2 tablets by mouth daily.   mometasone (ELOCON) 0.1 % cream Apply topically.   montelukast (SINGULAIR) 10 MG tablet Take 1 tablet (10 mg total) by mouth at bedtime.   Multiple Vitamin (MULITIVITAMIN WITH  MINERALS) TABS Take 1 tablet by mouth daily.   nitroGLYCERIN (NITROSTAT) 0.4 MG SL tablet Place 1 tablet (0.4 mg total) under the tongue every 5 (five) minutes as needed for chest pain up to 3 times. If no relief, call 911.   NURTEC 75 MG TBDP Take 1 each by mouth daily as needed.   ondansetron (ZOFRAN) 4 MG tablet Take 1 tablet (4 mg total) by mouth every 8 (eight) hours as needed for nausea or vomiting.   ONETOUCH ULTRA test strip USE TO CHECK BLOOD SUGAR 2 TIMES A DAY   oxyCODONE (ROXICODONE) 15 MG immediate release tablet Take 1 tablet by mouth four times a day   potassium chloride (KLOR-CON) 10 MEQ tablet Take 2 tablets (20 mEq total) by mouth every morning.   pregabalin (LYRICA) 150 MG capsule Take 1 capsule (150 mg total) by mouth 3 (three) times daily.   promethazine (PHENERGAN) 25 MG tablet Take 1 tablet (25 mg total) by mouth at bedtime.   tiZANidine (ZANAFLEX) 4 MG tablet Take 8 mg by mouth every 8 (eight) hours as needed for muscle spasms.   tiZANidine (ZANAFLEX) 4 MG tablet Take 2 tablets (8 mg total) by mouth 3 (three) times daily.   tiZANidine (ZANAFLEX) 4 MG tablet Take 2 tablets (8 mg total) by mouth 3 (three) times daily.   vitamin B-12 (  CYANOCOBALAMIN) 500 MCG tablet Take 500 mcg by mouth daily.    vitamin E 200 UNIT capsule Take 200 Units by mouth daily.   No facility-administered encounter medications on file as of 11/08/2023.    REVIEW OF SYSTEMS  : All other systems reviewed and negative except where noted in the History of Present Illness.   PHYSICAL EXAM: BP 118/68   Pulse 86   Ht 5\' 6"  (1.676 m)   Wt 213 lb (96.6 kg)   BMI 34.38 kg/m  General: Well developed white female in no acute distress Head: Normocephalic and atraumatic Eyes:  Sclerae anicteric, conjunctiva pink. Ears: Normal auditory acuity Lungs: Clear throughout to auscultation; no W/R/R. Heart: Regular rate and rhythm; no M/R/G. Abdomen: Soft, non-distended.  BS present.   Non-tender. Musculoskeletal: Symmetrical with no gross deformities  Skin: No lesions on visible extremities Neurological: Alert oriented x 4, grossly non-focal Psychological:  Alert and cooperative. Normal mood and affect  ASSESSMENT AND PLAN: *GERD: Will continue her Nexium 40 mg twice daily.  Is also on famotidine twice daily. *Nausea and vomiting: Has had issues with this intermittently in the past.  Uses Zofran and Phenergan as needed.  Was hospitalized in August for intractable nausea and vomiting.  Treated for UTI.  Did not have any other classic UTI symptoms.  Has been having vomiting again for the past couple of days.  Will recheck a UA with reflex. *Diarrhea: Seems to happen very sporadically.  She will have normal bowel movements for several days and then randomly will have sudden onset of diarrhea.  Has not had any issues with this now for the past 2 or 3 weeks though.  Sounds like it likely is something dietary related.  I asked her to keep a food and stool diarrhea.  She can try adding Benefiber powder beginning with 2 teaspoons mixed in 8 ounces of liquid daily to try to help with bulking the stool contents. *Dysphagia: Has a history of peptic stricture, last dilated in 2022.  Having some mild issues with dysphagia at this point, but wants to hold off on EGD with dilation for now.   CC:  Karie Georges, MD

## 2023-11-09 NOTE — Progress Notes (Signed)
Noted  

## 2023-11-13 ENCOUNTER — Emergency Department (HOSPITAL_COMMUNITY)
Admission: EM | Admit: 2023-11-13 | Discharge: 2023-11-14 | Disposition: A | Discharge status: Home / Self Care | Payer: Medicare Other | Attending: Emergency Medicine | Admitting: Emergency Medicine

## 2023-11-13 ENCOUNTER — Emergency Department (HOSPITAL_COMMUNITY): Payer: Medicare Other

## 2023-11-13 ENCOUNTER — Ambulatory Visit (HOSPITAL_COMMUNITY)
Admission: RE | Admit: 2023-11-13 | Discharge: 2023-11-13 | Disposition: A | Discharge status: Home / Self Care | Payer: Medicare Other | Source: Ambulatory Visit | Attending: Family Medicine | Admitting: Family Medicine

## 2023-11-13 ENCOUNTER — Other Ambulatory Visit: Payer: Self-pay

## 2023-11-13 ENCOUNTER — Encounter (HOSPITAL_COMMUNITY): Payer: Self-pay

## 2023-11-13 VITALS — BP 93/65 | HR 91 | Temp 98.3°F | Resp 20

## 2023-11-13 DIAGNOSIS — R071 Chest pain on breathing: Secondary | ICD-10-CM

## 2023-11-13 DIAGNOSIS — Z7982 Long term (current) use of aspirin: Secondary | ICD-10-CM | POA: Insufficient documentation

## 2023-11-13 DIAGNOSIS — R112 Nausea with vomiting, unspecified: Secondary | ICD-10-CM

## 2023-11-13 DIAGNOSIS — B349 Viral infection, unspecified: Secondary | ICD-10-CM | POA: Diagnosis not present

## 2023-11-13 DIAGNOSIS — J189 Pneumonia, unspecified organism: Secondary | ICD-10-CM | POA: Insufficient documentation

## 2023-11-13 DIAGNOSIS — R059 Cough, unspecified: Secondary | ICD-10-CM | POA: Diagnosis not present

## 2023-11-13 DIAGNOSIS — Z20822 Contact with and (suspected) exposure to covid-19: Secondary | ICD-10-CM | POA: Insufficient documentation

## 2023-11-13 DIAGNOSIS — R918 Other nonspecific abnormal finding of lung field: Secondary | ICD-10-CM | POA: Diagnosis not present

## 2023-11-13 LAB — CBC WITH DIFFERENTIAL/PLATELET
Abs Immature Granulocytes: 0.05 10*3/uL (ref 0.00–0.07)
Basophils Absolute: 0.1 10*3/uL (ref 0.0–0.1)
Basophils Relative: 1 %
Eosinophils Absolute: 0.3 10*3/uL (ref 0.0–0.5)
Eosinophils Relative: 3 %
HCT: 42 % (ref 36.0–46.0)
Hemoglobin: 13.4 g/dL (ref 12.0–15.0)
Immature Granulocytes: 1 %
Lymphocytes Relative: 16 %
Lymphs Abs: 1.5 10*3/uL (ref 0.7–4.0)
MCH: 28.6 pg (ref 26.0–34.0)
MCHC: 31.9 g/dL (ref 30.0–36.0)
MCV: 89.6 fL (ref 80.0–100.0)
Monocytes Absolute: 0.7 10*3/uL (ref 0.1–1.0)
Monocytes Relative: 8 %
Neutro Abs: 6.4 10*3/uL (ref 1.7–7.7)
Neutrophils Relative %: 71 %
Platelets: 359 10*3/uL (ref 150–400)
RBC: 4.69 MIL/uL (ref 3.87–5.11)
RDW: 14.7 % (ref 11.5–15.5)
WBC: 8.9 10*3/uL (ref 4.0–10.5)
nRBC: 0 % (ref 0.0–0.2)

## 2023-11-13 LAB — COMPREHENSIVE METABOLIC PANEL
ALT: 25 U/L (ref 0–44)
AST: 31 U/L (ref 15–41)
Albumin: 3 g/dL — ABNORMAL LOW (ref 3.5–5.0)
Alkaline Phosphatase: 67 U/L (ref 38–126)
Anion gap: 19 — ABNORMAL HIGH (ref 5–15)
BUN: 21 mg/dL (ref 8–23)
CO2: 22 mmol/L (ref 22–32)
Calcium: 10.2 mg/dL (ref 8.9–10.3)
Chloride: 103 mmol/L (ref 98–111)
Creatinine, Ser: 0.97 mg/dL (ref 0.44–1.00)
GFR, Estimated: >60 mL/min (ref >60)
Glucose, Bld: 109 mg/dL — ABNORMAL HIGH (ref 70–99)
Potassium: 3.7 mmol/L (ref 3.5–5.1)
Sodium: 144 mmol/L (ref 135–145)
Total Bilirubin: 0.4 mg/dL (ref <1.2)
Total Protein: 7.5 g/dL (ref 6.5–8.1)

## 2023-11-13 LAB — I-STAT CG4 LACTIC ACID, ED: Lactic Acid, Venous: 2 mmol/L (ref 0.5–1.9)

## 2023-11-13 LAB — URINALYSIS, W/ REFLEX TO CULTURE (INFECTION SUSPECTED)
Bilirubin Urine: NEGATIVE
Glucose, UA: >=500 mg/dL — AB
Hgb urine dipstick: NEGATIVE
Ketones, ur: NEGATIVE mg/dL
Nitrite: NEGATIVE
Protein, ur: NEGATIVE mg/dL
Specific Gravity, Urine: 1.023 (ref 1.005–1.030)
pH: 5 (ref 5.0–8.0)

## 2023-11-13 NOTE — ED Provider Notes (Signed)
[facility]    CSN: [id number] Arrival date & time: 11/13/23  1402      History   Chief Complaint Chief Complaint  Patient presents with   Fever    Need to have COVID test done.. Have had symptoms since 11/06/2023 - Entered by patient   Emesis   Sore Throat   Cough    HPI Shelby Anderson is a 67 y.o. female.   Patient presents today for 1 week history of, dry cough, shortness of breath, pain in her chest when she takes a deep breath, runny and stuffy nose, sore throat, headache, dizziness/lightheadedness with position changes, nausea and vomiting, decreased appetite, and fatigue.  Reports fever broke Sunday, has not had a fever since then, Tmax prior to the fever breaking was 103.1 F.  Reports abdominal pain comes and goes, has had multiple episodes of nausea, vomiting, without diarrhea.  Has been taking Zofran and Phenergan for vomiting without relief.  Reports yesterday, she was not able to keep any food or fluids down, today she has been able to keep sips of ginger ale down okay.  Tried calling her PCP office, they told her they could not get her in until the end of January.  Saw GI provider on Friday for chronic issues, they recommended urgent care.  Patient reports she had similar symptoms a few months ago and was diagnosed with a UTI, was hospitalized for 5 days.  She denies any burning with urination, increased urinary frequency or urgency, or voiding smaller amounts.    Past Medical History:  Diagnosis Date   Allergy    Anemia    "chronic"   Angina    Prinzmetal angina   Anxiety and depression    Arthritis    Asthma    Atrial fibrillation (HCC)    h/o "AF w/frequent PVCs"   Breast cancer (HCC)    Left   Cancer (HCC)    hx of skin cancer    CHF (congestive heart failure) (HCC)    Chronic back pain greater than 3 months duration    on chronic narcotics, treated at pain clinic   Chronic respiratory failure (HCC) 09/15/2015   ONO 09/04/15 + desats  >begin O2 at 2l/ m    Colon polyps    hyperplastic   Coronary artery disease    Arrythmia, orthostatic hypotension, HLD, HTN; sees Dr. Jacinto Halim   Difficult intubation    "TMJ & woke up when they were still cutting on me"   Dysrhythmia    sees Dr. Jacinto Halim and a cardiologist at [facility]   Esophageal stricture    Family history of melanoma ]   Family history of pancreatic cancer    Fatty liver    Fatty liver    Fibroids    Fibromyalgia    "in my legs"   GERD (gastroesophageal reflux disease)    hx hiatal hernia, stricture and gastric ulcer   Headache(784.0)    migraines   Heart murmur    Hiatal hernia    History of loop recorder    History of migraines    "dx'd when I was in my teens"   Hyperlipemia    Hypertension    Mental disorder    Mild episode of recurrent major depressive disorder (HCC) 12/06/2015   Myocardial infarction [facility]) 1980's & 1990;   sees Dr. Jacinto Halim   OSA (obstructive sleep apnea)    Personal history of chemotherapy    Personal history of radiation therapy  Pneumonia    multiple times   PONV (postoperative nausea and vomiting)    Recurrent upper respiratory infection (URI)    S/P left TKA 09/25/2016   Shortness of breath 11/20/11   "all the time", sees pulmonlogy, ? asthma   Stenotic cervical os    Stomach ulcer    "3 small; found in 05/2011"   TMJ (dislocation of temporomandibular joint)    Tuberculosis    + TB SKIN TEST   Type 2 diabetes mellitus without complication, without long-term current use of insulin (HCC) 12/06/2015   not on meds     Patient Active Problem List   Diagnosis Date Noted   UTI (urinary tract infection) 08/01/2023   Nausea and vomiting 07/31/2023   Acute cystitis 07/31/2023   Hypothyroidism 09/11/2022   Dental caries 06/12/2022   Spondylosis without myelopathy or radiculopathy, cervical region 04/21/2021   Neutropenia with fever (HCC) 10/01/2019   Genetic testing 09/17/2019   Family history of melanoma    Family history of  pancreatic cancer    Malignant neoplasm of upper-outer quadrant of left breast in female, estrogen receptor negative (HCC) 09/07/2019   Encounter for loop recorder at end of battery life 04/24/2018   Esophageal stricture 07/01/2017   Hyperlipidemia associated with type 2 diabetes mellitus (HCC) 05/20/2017   Allergic rhinitis 03/11/2017   Dilated cardiomyopathy (HCC) 02/07/2017   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 11/13/2016   Morbid obesity due to excess calories (HCC) 09/26/2016   Increased endometrial stripe thickness 06/27/2016   Intramural leiomyoma of uterus 06/27/2016   Type 2 diabetes mellitus without complication, without long-term current use of insulin (HCC) 12/06/2015   HTN (hypertension), benign 07/19/2015   Arrhythmia 07/19/2015   Orthostatic hypotension 07/19/2015   GERD (gastroesophageal reflux disease) 07/19/2015   Chronic back pain 07/19/2015   Neuropathy 07/19/2015   Symptomatic PVCs 11/02/2014   Syncope 11/02/2014   Status post placement of implantable loop recorder 11/02/2014   Stenosis of cervix 01/07/2014   Chest pain 12/23/2013   Disorder of cervix 03/10/2013   Vaginal atrophy 03/10/2013   OSA (obstructive sleep apnea) 07/31/2012   Asthma 04/29/2012   Coronary artery disease 11/20/2011    Past Surgical History:  Procedure Laterality Date   ACHILLES TENDON REPAIR  1970's   left ankle   ARTHROSCOPIC REPAIR ACL     left knee cap   BREAST BIOPSY Right 04/08/2013   again in October or November 2020   BREAST LUMPECTOMY Left 12/09/2019   BREAST LUMPECTOMY WITH RADIOACTIVE SEED AND SENTINEL LYMPH NODE BIOPSY Left 12/09/2019   Procedure: LEFT BREAST LUMPECTOMY WITH RADIOACTIVE SEED AND LEFT AXILLARY SENTINEL LYMPH NODE BIOPSY;  Surgeon: Emelia Loron, MD;  Location: [facility];  Service: General;  Laterality: Left;  PEC BLOCK   CARDIAC CATHETERIZATION     loop recorder   CARPAL TUNNEL RELEASE  unknown   left hand   COLONOSCOPY      ESOPHAGOGASTRODUODENOSCOPY (EGD) WITH PROPOFOL N/A 10/29/2017   Procedure: ESOPHAGOGASTRODUODENOSCOPY (EGD) WITH PROPOFOL;  Surgeon: Hilarie Fredrickson, MD;  Location: [facility];  Service: Endoscopy;  Laterality: N/A;   LOOP RECORDER IMPLANT     PORT-A-CATH REMOVAL N/A 12/09/2019   Procedure: REMOVAL PORT-A-CATH;  Surgeon: Emelia Loron, MD;  Location: [facility] OR;  Service: General;  Laterality: N/A;   PORTACATH PLACEMENT N/A 09/23/2019   Procedure: INSERTION PORT-A-CATH Right Internal Feliberto Harts;  Surgeon: Emelia Loron, MD;  Location: [facility] OR;  Service: General;  Laterality: N/A;   post ganglionectomy  1970's   "for migraine headaches"   pouch string  79,81,83   "did this 3 times (once w/each pregnancy)"   SAVORY DILATION N/A 10/29/2017   Procedure: SAVORY DILATION;  Surgeon: Hilarie Fredrickson, MD;  Location: [facility];  Service: Endoscopy;  Laterality: N/A;   TOTAL KNEE ARTHROPLASTY Left 09/25/2016   Procedure: LEFT TOTAL KNEE ARTHROPLASTY;  Surgeon: Durene Romans, MD;  Location: [facility];  Service: Orthopedics;  Laterality: Left;   TUBAL LIGATION  1980's    OB History     Gravida  5   Para  3   Term      Preterm      AB  2   Living  3      SAB  2   IAB      Ectopic      Multiple      Live Births               Home Medications    Prior to Admission medications   Medication Sig Start Date End Date Taking? Authorizing Provider  albuterol (VENTOLIN HFA) 108 (90 Base) MCG/ACT inhaler Inhale 1-2 puffs into the lungs every 4 (four) hours as needed for wheezing or shortness of breath. 08/22/23  Yes Parrett, Tammy S, NP  aspirin EC 81 MG tablet Take 81 mg at bedtime by mouth.    Yes [provider]  atorvastatin (LIPITOR) 80 MG tablet TAKE ONE TABLET BY MOUTH EVERYDAY AT BEDTIME Needs appointment for further refills 07/08/23  Yes Karie Georges, MD  Blood Glucose Monitoring Suppl (ONE TOUCH ULTRA 2) w/Device KIT Use as directed 04/06/22  Yes  Koberlein, Paris Lore, MD  budesonide-formoterol (SYMBICORT) 80-4.5 MCG/ACT inhaler Inhale 2 puffs into the lungs every morning and another 2 puffs 12 hours later. 08/22/23  Yes Parrett, Tammy S, NP  buPROPion (WELLBUTRIN XL) 150 MG 24 hr tablet Take 1 tablet (150 mg total) by mouth daily. 03/27/23  Yes   Calcium Carbonate-Vitamin D 600-200 MG-UNIT TABS Take 1 tablet by mouth daily. 01/01/11  Yes [provider]  Cholecalciferol (VITAMIN D3) 50 MCG (2000 UT) capsule Take 2,000 Units by mouth daily.   Yes [provider]  CINNAMON PO Take 1,000 mg 2 (two) times daily by mouth.   Yes [provider]  dapagliflozin propanediol (FARXIGA) 10 MG TABS tablet Take 1 tablet (10 mg total) by mouth daily before breakfast. 07/18/23  Yes Karie Georges, MD  doxepin (SINEQUAN) 10 MG capsule Take by mouth. 05/01/22  Yes [provider]  doxepin (SINEQUAN) 50 MG capsule Take 1 capsule (50 mg total) by mouth at bedtime. 04/05/23  Yes Thyra Breed, MD  esomeprazole (NEXIUM) 40 MG capsule Take 1 capsule (40 mg total) by mouth 2 (two) times daily. Office visit for further refills 08/27/23  Yes Hilarie Fredrickson, MD  famotidine (PEPCID) 20 MG tablet TAKE ONE TABLET BY MOUTH TWICE DAILY FOR INDIGESTION / heartburn 05/08/23  Yes Hilarie Fredrickson, MD  fluticasone [facility]) 50 MCG/ACT nasal spray Place 1 spray into both nostrils at bedtime.   Yes [provider]  furosemide (LASIX) 20 MG tablet Take 1 tablet (20 mg total) by mouth daily as needed for ankle swelling. 02/05/23  Yes Karie Georges, MD  glipiZIDE (GLUCOTROL XL) 5 MG 24 hr tablet TAKE ONE TABLET BY MOUTH EVERY MORNING Needs appointment for further refills 07/08/23  Yes Karie Georges, MD  Glucosamine 750 MG TABS Take 750 mg by mouth 2 (  two) times daily. 11/02/14  Yes [provider]  glucose blood (ONETOUCH ULTRA) test strip 1 each by Other route 2 (two) times daily. 05/06/23  Yes Karie Georges, MD  hydrALAZINE  (APRESOLINE) 25 MG tablet Take 1 tablet (25 mg total) by mouth daily as needed for blood pressure >140/55mmhg, up to three times a day 08/02/23  Yes   isosorbide mononitrate (IMDUR) 60 MG 24 hr tablet TAKE ONE TABLET BY MOUTH EVERY MORNING Needs appointment for further refills 07/08/23  Yes Karie Georges, MD  L-Methylfolate (DEPLIN) 7.5 MG TABS Take 7.5 mg by mouth daily with breakfast.   Yes [provider]  Lancets (ONETOUCH DELICA PLUS LANCET30G) MISC USE TO CHECK BLOOD SUGAR 2 TIMES A DAY 10/04/21  Yes Wynn Banker, MD  levothyroxine (SYNTHROID) 75 MCG tablet TAKE 1 TABLET BY MOUTH BEFORE BREAKFAST 08/06/23  Yes Karie Georges, MD  lisinopril (ZESTRIL) 5 MG tablet Take 1 tablet (5 mg total) by mouth every morning. 03/27/23  Yes   LYRICA 150 MG capsule Take 150 mg by mouth 3 (three) times daily.  05/28/18  Yes [provider]  metFORMIN (GLUCOPHAGE) 500 MG tablet Take 1 tablet (500 mg total) by mouth 2 (two) times daily with a meal. 07/08/23  Yes   metoprolol succinate (TOPROL-XL) 25 MG 24 hr tablet Take 1 tablet (25 mg total) by mouth daily. TAKE WITH OR IMMEDIATELY FOLLOWING A MEAL. 05/09/23  Yes Tolia, Sunit, DO  Misc Natural Products (GLUCOS-CHONDROIT-MSM COMPLEX PO) Take 2 tablets by mouth daily.   Yes [provider]  mometasone (ELOCON) 0.1 % cream Apply topically. 09/12/20  Yes [provider]  montelukast (SINGULAIR) 10 MG tablet Take 1 tablet (10 mg total) by mouth at bedtime. 08/22/23  Yes Parrett, Tammy S, NP  Multiple Vitamin (MULITIVITAMIN WITH MINERALS) TABS Take 1 tablet by mouth daily.   Yes [provider]  NURTEC 75 MG TBDP Take 1 each by mouth daily as needed. 10/29/22  Yes Karie Georges, MD  ondansetron (ZOFRAN) 4 MG tablet Take 1 tablet (4 mg total) by mouth every 8 (eight) hours as needed for nausea or vomiting. 08/27/23  Yes Hilarie Fredrickson, MD  [facility] ULTRA test strip USE TO CHECK BLOOD SUGAR 2 TIMES A DAY 01/01/22  Yes  Koberlein, Paris Lore, MD  potassium chloride (KLOR-CON) 10 MEQ tablet Take 2 tablets (20 mEq total) by mouth every morning. 08/28/23  Yes Karie Georges, MD  pregabalin (LYRICA) 150 MG capsule Take 1 capsule (150 mg total) by mouth 3 (three) times daily. 10/30/23  Yes   promethazine (PHENERGAN) 25 MG tablet Take 1 tablet (25 mg total) by mouth at bedtime. 08/27/23  Yes Hilarie Fredrickson, MD  tiZANidine (ZANAFLEX) 4 MG tablet Take 2 tablets (8 mg total) by mouth 3 (three) times daily. 05/06/23  Yes   vitamin B-12 (CYANOCOBALAMIN) 500 MCG tablet Take 500 mcg by mouth daily.    Yes [provider]  vitamin E 200 UNIT capsule Take 200 Units by mouth daily.   Yes [provider]  albuterol (PROVENTIL) (2.5 MG/3ML) 0.083% nebulizer solution Inhale 3 mLs (2.5 mg total) by nebulization every 6 (six) hours as needed for wheezing or shortness of breath (J45.40). 08/22/23   Parrett, Virgel Bouquet, NP  fluconazole (DIFLUCAN) 100 MG tablet Take 1 tablet (100 mg total) by mouth daily. 11/08/23   Zehr, Shanda Bumps D, PA-C  hydrALAZINE (APRESOLINE) 25 MG tablet TAKE 1 TABLET BY MOUTH DAILY AS  NEEDED ( FOR BLOOD PRESSURE >140/90MMHG UP TO THREE TIMES A DAY ) 08/02/23   Tolia, Sunit, DO  nitroGLYCERIN (NITROSTAT) 0.4 MG SL tablet Place 1 tablet (0.4 mg total) under the tongue every 5 (five) minutes as needed for chest pain up to 3 times. If no relief, call 911. 05/01/23   Yates Decamp, MD  oxyCODONE (ROXICODONE) 15 MG immediate release tablet Take 1 tablet by mouth four times a day 06/06/22       Family History Family History  Problem Relation Age of Onset   Malignant hyperthermia Father    Hypertension Father    Heart disease Father    Diabetes Father    Cancer Father        skin   Hypertension Mother    Heart disease Mother    Multiple myeloma Mother    Cancer Sister        CERVICAL   Hypertension Sister    Cancer Brother 22       MELANOMA   Heart disease Maternal Grandmother    Other Maternal Grandmother  32       complications of childbirth   Heart disease Maternal Grandfather    Cancer Paternal Grandmother        ?    Heart disease Paternal Grandmother    Heart disease Paternal Grandfather    Cancer Brother        LUNG   Diabetes Sister    Hypertension Sister    Heart disease Sister    Cancer Sister    Cancer Brother    Pancreatic cancer Niece 71   Cancer Nephew 40       unknown- currently in the military   Anesthesia problems Neg Hx    Hypotension Neg Hx    Pseudochol deficiency Neg Hx    Colon cancer Neg Hx    Esophageal cancer Neg Hx    Rectal cancer Neg Hx    Stomach cancer Neg Hx    Breast cancer Neg Hx     Social History Social History   Tobacco Use   Smoking status: Never   Smokeless tobacco: Never  Vaping Use   Vaping status: Never Used  Substance Use Topics   Alcohol use: No    Alcohol/week: 0.0 standard drinks of alcohol   Drug use: No     Allergies   Lodine [etodolac], Oxycontin [oxycodone hcl], Penicillins, Aspirin, Darvocet [propoxyphene n-acetaminophen], Nitroglycerin, Propoxyphene, Tramadol, and Valium   Review of Systems Review of Systems Per HPI  Physical Exam Triage Vital Signs ED Triage Vitals  Encounter Vitals Group     BP 11/13/23 1415 93/65     Systolic BP Percentile --      Diastolic BP Percentile --      Pulse Rate 11/13/23 1415 91     Resp 11/13/23 1415 20     Temp 11/13/23 1415 98.3 F (36.8 C)     Temp Source 11/13/23 1415 Oral     SpO2 11/13/23 1415 93 %     Weight --      Height --      Head Circumference --      Peak Flow --      Pain Score 11/13/23 1411 3     Pain Loc --      Pain Education --      Exclude from Growth Chart --    No data found.  Updated Vital Signs BP 93/65 (BP Location: Right Arm)   Pulse 91  Temp 98.3 F (36.8 C) (Oral)   Resp 20   SpO2 93%   Visual Acuity Right Eye Distance:   Left Eye Distance:   Bilateral Distance:    Right Eye Near:   Left Eye Near:    Bilateral Near:      Physical Exam Vitals and nursing note reviewed.  Constitutional:      General: She is not in acute distress.    Appearance: Normal appearance. She is not ill-appearing or toxic-appearing.  HENT:     Head: Normocephalic and atraumatic.     Right Ear: Tympanic membrane, ear canal and external ear normal. No drainage, swelling or tenderness. No middle ear effusion. Tympanic membrane is not erythematous.     Left Ear: Tympanic membrane, ear canal and external ear normal. No drainage, swelling or tenderness.  No middle ear effusion. Tympanic membrane is not erythematous.     Nose: Congestion present. No rhinorrhea.     Mouth/Throat:     Mouth: Mucous membranes are dry.     Pharynx: Oropharynx is clear. No oropharyngeal exudate or posterior oropharyngeal erythema.  Eyes:     General: No scleral icterus.    Extraocular Movements: Extraocular movements intact.  Cardiovascular:     Rate and Rhythm: Regular rhythm. Tachycardia present.  Pulmonary:     Effort: Pulmonary effort is normal. No respiratory distress.     Breath sounds: Normal breath sounds. Decreased air movement present.  Abdominal:     General: Abdomen is flat.  Musculoskeletal:     Cervical back: Normal range of motion and neck supple.  Lymphadenopathy:     Cervical: No cervical adenopathy.  Skin:    General: Skin is warm and dry.     Coloration: Skin is not jaundiced or pale.     Findings: No erythema or rash.  Neurological:     Mental Status: She is alert and oriented to person, place, and time.     Motor: No weakness.  Psychiatric:        Behavior: Behavior is cooperative.   Examination slightly reviewed as it is my recommendation patient be evaluated in the emergency room for symptoms.   UC Treatments / Results  Labs (all labs ordered are listed, but only abnormal results are displayed) Labs Reviewed - No data to display  EKG   Radiology No results found.  Procedures Procedures (including critical care  time)  Medications Ordered in UC Medications - No data to display  Initial Impression / Assessment and Plan / UC Course  I have reviewed the triage vital signs and the nursing notes.  Pertinent labs & imaging results that were available during my care of the patient were reviewed by me and considered in my medical decision making (see chart for details).   In triage, vitals are stable, however blood pressure is slightly decreased and patient reports not taking any antihypertensives since she has been sick.  SpO2 is 93% on room air.  1. Nausea and vomiting, unspecified vomiting type 2. Viral illness 3. Chest pain varying with breathing   Concerned that patient is slightly hypotensive, is dizzy/lightheaded with position changes and not able to control nausea and vomiting with Zofran and Phenergan I do suspect a viral etiology, possible pneumonia with a right sided chest pain, however given unstable vital signs and length of illness, I believe further workup and evaluation is warranted Patient in agreement to plan She is to transport via private vehicle  The patient was given the opportunity to  ask questions.  All questions answered to their satisfaction.  The patient is in agreement to this plan.    Final Clinical Impressions(s) / UC Diagnoses   Final diagnoses:  Nausea and vomiting, unspecified vomiting type  Viral illness  Chest pain varying with breathing   Discharge Instructions   None    ED Prescriptions   None    PDMP not reviewed this encounter.   Valentino Nose, NP 11/13/23 1513

## 2023-11-13 NOTE — ED Triage Notes (Signed)
Pt states that she has been coughing, vomiting, fever since 12/3. She has seen her GI MD last week for the vomiting, they did UA and advised follow up with PCP pt states she called but can't get into that office until next year.   She hasn't been taking her diabetic meds due to vomiting.   She has a nebulizer but states she only uses it in winter so she hasn't used it for these sx. She has a rx for zofran and phenergan which she has been taking both.

## 2023-11-13 NOTE — ED Triage Notes (Signed)
Pt came to ED for rib pain that started last Tuesday. and low BP. Pt states had same sx in August and was admitted for it. C/O coughing and vomiting. Axox4.

## 2023-11-13 NOTE — ED Provider Triage Note (Signed)
Emergency Medicine Provider Triage Evaluation Note  Shelby Anderson , a 67 y.o. female  was evaluated in triage.  Pt complains of cough. 1 weeks of fever, chills, cough, cp, sob, n/v, congestion and decreased in appetite.  Seen at [facility] today and sent here for further eval  Review of Systems  Positive: As above Negative: As above  Physical Exam  BP 108/79   Pulse 88   Temp 98.2 F (36.8 C)   Resp 18   Ht 5\' 6"  (1.676 m)   Wt 96.6 kg   SpO2 96%   BMI 34.38 kg/m  Gen:   Awake, no distress   Resp:  Normal effort  MSK:   Moves extremities without difficulty  Other:    Medical Decision Making  Medically screening exam initiated at 3:26 PM.  Appropriate orders placed.  Shelby Anderson was informed that the remainder of the evaluation will be completed by another provider, this initial triage assessment does not replace that evaluation, and the importance of remaining in the ED until their evaluation is complete.     Fayrene Helper, PA-C 11/13/23 1529

## 2023-11-13 NOTE — ED Notes (Signed)
Patient is being discharged from the Urgent Care and sent to the Emergency Department via POV . Per Cathlean Marseilles, NP, patient is in need of higher level of care due to hypotensive, abdominal pain and vomiting. Patient is aware and verbalizes understanding of plan of care.  Vitals:   11/13/23 1415  BP: 93/65  Pulse: 91  Resp: 20  Temp: 98.3 F (36.8 C)  SpO2: 93%

## 2023-11-14 ENCOUNTER — Other Ambulatory Visit (HOSPITAL_COMMUNITY): Payer: Self-pay

## 2023-11-14 LAB — RESP PANEL BY RT-PCR (RSV, FLU A&B, COVID)  RVPGX2
Influenza A by PCR: NEGATIVE
Influenza B by PCR: NEGATIVE
Resp Syncytial Virus by PCR: NEGATIVE
SARS Coronavirus 2 by RT PCR: NEGATIVE

## 2023-11-14 MED ORDER — LEVOFLOXACIN 500 MG PO TABS: 500.0000 mg | ORAL_TABLET | Freq: Every day | ORAL | 0 refills | Status: DC

## 2023-11-14 MED ORDER — HYDROCODONE BIT-HOMATROP MBR 5-1.5 MG/5ML PO SOLN
ORAL | Status: AC
  Filled 2023-11-14: qty 5

## 2023-11-14 MED ORDER — LEVOFLOXACIN 500 MG PO TABS
500.0000 mg | ORAL_TABLET | Freq: Every day | ORAL | 0 refills | Status: DC
  Filled 2023-11-14: qty 7, 7d supply, fill #0

## 2023-11-14 MED ORDER — LEVOFLOXACIN 750 MG PO TABS
750.0000 mg | ORAL_TABLET | Freq: Once | ORAL | Status: AC
  Administered 2023-11-14: 750 mg via ORAL

## 2023-11-14 MED ORDER — HYDROCODONE BIT-HOMATROP MBR 5-1.5 MG/5ML PO SOLN
5.0000 mL | Freq: Four times a day (QID) | ORAL | 0 refills | Status: DC | PRN
  Filled 2023-11-14: qty 120, 6d supply, fill #0

## 2023-11-14 MED ORDER — HYDROCODONE BIT-HOMATROP MBR 5-1.5 MG/5ML PO SOLN: 5.0000 mL | Freq: Four times a day (QID) | ORAL | 0 refills | Status: DC | PRN

## 2023-11-14 NOTE — ED Notes (Signed)
Per Dr. Clayborne Dana, do not collect repeat lactic

## 2023-11-15 ENCOUNTER — Other Ambulatory Visit: Payer: Self-pay

## 2023-11-15 ENCOUNTER — Other Ambulatory Visit (HOSPITAL_COMMUNITY): Payer: Self-pay

## 2023-11-15 ENCOUNTER — Telehealth: Payer: Self-pay

## 2023-11-15 NOTE — Patient Outreach (Signed)
  Care Management   Outreach Note  11/15/2023 Name: Shelby Anderson MRN: [id number] DOB: 1956-06-05  An unsuccessful outreach attempt was made today for a scheduled Care Management visit.     Follow Up Plan:  A HIPAA compliant phone message was left for the patient providing contact information and requesting a return call.    [facility]  [facility], [facility] RN Care Manager Direct Dial: [phone] Website: [email or web address]

## 2023-11-15 NOTE — ED Provider Notes (Signed)
[place] EMERGENCY DEPARTMENT AT [facility] Provider Note   CSN: [id number] Arrival date & time: 11/13/23  1505     History  Chief Complaint  Patient presents with   Hypotension   Rib Injury    Shelby Anderson is a 67 y.o. female.  40 y o F here with rib pain from coughing for the last few days. Went to [facility] had soft pressures and concern for PNA so sent here for further eval. Patient denies fever. Does have productive cough. Wants checked for covid. States her BP's on a good day are around 105-110 systolic since she is on HTN meds. . No lightheadedness. Has had some emesis but mostly just cough. No diarrhea. No sick contacts.         Home Medications Prior to Admission medications   Medication Sig Start Date End Date Taking? Authorizing Provider  albuterol (PROVENTIL) (2.5 MG/3ML) 0.083% nebulizer solution Inhale 3 mLs (2.5 mg total) by nebulization every 6 (six) hours as needed for wheezing or shortness of breath (J45.40). 08/22/23  Yes Parrett, Tammy S, NP  albuterol (VENTOLIN HFA) 108 (90 Base) MCG/ACT inhaler Inhale 1-2 puffs into the lungs every 4 (four) hours as needed for wheezing or shortness of breath. 08/22/23  Yes Parrett, Tammy S, NP  aspirin EC 81 MG tablet Take 81 mg at bedtime by mouth.    Yes [provider]  atorvastatin (LIPITOR) 80 MG tablet TAKE ONE TABLET BY MOUTH EVERYDAY AT BEDTIME Needs appointment for further refills 07/08/23  Yes Karie Georges, MD  budesonide-formoterol [facility]) 80-4.5 MCG/ACT inhaler Inhale 2 puffs into the lungs every morning and another 2 puffs 12 hours later. 08/22/23  Yes Parrett, Tammy S, NP  buPROPion (WELLBUTRIN XL) 150 MG 24 hr tablet Take 1 tablet (150 mg total) by mouth daily. 03/27/23  Yes   Calcium Carbonate-Vitamin D 600-200 MG-UNIT TABS Take 1 tablet by mouth daily. 01/01/11  Yes [provider]  Cholecalciferol (VITAMIN D3) 50 MCG (2000 UT) capsule Take 2,000 Units by mouth daily.   Yes  [provider]  CINNAMON PO Take 1,000 mg 2 (two) times daily by mouth.   Yes [provider]  dapagliflozin propanediol (FARXIGA) 10 MG TABS tablet Take 1 tablet (10 mg total) by mouth daily before breakfast. 07/18/23  Yes Karie Georges, MD  doxepin (SINEQUAN) 50 MG capsule Take 1 capsule (50 mg total) by mouth at bedtime. 04/05/23  Yes Thyra Breed, MD  esomeprazole (NEXIUM) 40 MG capsule Take 1 capsule (40 mg total) by mouth 2 (two) times daily. Office visit for further refills 08/27/23  Yes Hilarie Fredrickson, MD  famotidine (PEPCID) 20 MG tablet TAKE ONE TABLET BY MOUTH TWICE DAILY FOR INDIGESTION / heartburn 05/08/23  Yes Hilarie Fredrickson, MD  fluticasone [facility]) 50 MCG/ACT nasal spray Place 1 spray into both nostrils at bedtime.   Yes [provider]  furosemide (LASIX) 20 MG tablet Take 1 tablet (20 mg total) by mouth daily as needed for ankle swelling. 02/05/23  Yes Karie Georges, MD  glipiZIDE (GLUCOTROL XL) 5 MG 24 hr tablet TAKE ONE TABLET BY MOUTH EVERY MORNING Needs appointment for further refills 07/08/23  Yes Karie Georges, MD  hydrALAZINE (APRESOLINE) 25 MG tablet Take 1 tablet (25 mg total) by mouth daily as needed for blood pressure >140/39mmhg, up to three times a day 08/02/23  Yes   isosorbide mononitrate (IMDUR) 60 MG 24 hr tablet TAKE ONE TABLET BY MOUTH  EVERY MORNING Needs appointment for further refills 07/08/23  Yes Karie Georges, MD  L-Methylfolate (DEPLIN) 7.5 MG TABS Take 7.5 mg by mouth daily with breakfast.   Yes [provider]  levothyroxine (SYNTHROID) 75 MCG tablet TAKE 1 TABLET BY MOUTH BEFORE BREAKFAST 08/06/23  Yes Karie Georges, MD  lisinopril (ZESTRIL) 5 MG tablet Take 1 tablet (5 mg total) by mouth every morning. 03/27/23  Yes   LYRICA 150 MG capsule Take 150 mg by mouth 3 (three) times daily.  05/28/18  Yes [provider]  metFORMIN (GLUCOPHAGE) 500 MG tablet Take 1 tablet (500 mg total) by mouth 2 (two) times  daily with a meal. 07/08/23  Yes   metoprolol succinate (TOPROL-XL) 25 MG 24 hr tablet Take 1 tablet (25 mg total) by mouth daily. TAKE WITH OR IMMEDIATELY FOLLOWING A MEAL. 05/09/23  Yes Tolia, Sunit, DO  Misc Natural Products (GLUCOS-CHONDROIT-MSM COMPLEX PO) Take 2 tablets by mouth daily.   Yes [provider]  mometasone (ELOCON) 0.1 % cream Apply 1 Application topically daily as needed (for redness/raw). Apply topically behind ears 09/12/20  Yes [provider]  montelukast (SINGULAIR) 10 MG tablet Take 1 tablet (10 mg total) by mouth at bedtime. 08/22/23  Yes Parrett, Tammy S, NP  Multiple Vitamin (MULITIVITAMIN WITH MINERALS) TABS Take 1 tablet by mouth daily.   Yes [provider]  nitroGLYCERIN (NITROSTAT) 0.4 MG SL tablet Place 1 tablet (0.4 mg total) under the tongue every 5 (five) minutes as needed for chest pain up to 3 times. If no relief, call 911. 05/01/23  Yes Yates Decamp, MD  NURTEC 75 MG TBDP Take 1 each by mouth daily as needed. Patient taking differently: Take 75 mg by mouth daily as needed (for migraines). 10/29/22  Yes Karie Georges, MD  ondansetron (ZOFRAN) 4 MG tablet Take 1 tablet (4 mg total) by mouth every 8 (eight) hours as needed for nausea or vomiting. 08/27/23  Yes Hilarie Fredrickson, MD  oxyCODONE (ROXICODONE) 15 MG immediate release tablet Take 1 tablet by mouth four times a day Patient taking differently: Take 15 mg by mouth every 8 (eight) hours as needed for pain. 06/06/22  Yes   potassium chloride (KLOR-CON) 10 MEQ tablet Take 2 tablets (20 mEq total) by mouth every morning. 08/28/23  Yes Karie Georges, MD  pregabalin (LYRICA) 150 MG capsule Take 1 capsule (150 mg total) by mouth 3 (three) times daily. 10/30/23  Yes   tiZANidine (ZANAFLEX) 4 MG tablet Take 2 tablets (8 mg total) by mouth 3 (three) times daily. 05/06/23  Yes   vitamin B-12 (CYANOCOBALAMIN) 500 MCG tablet Take 500 mcg by mouth daily.    Yes [provider]  vitamin E 200  UNIT capsule Take 200 Units by mouth daily.   Yes [provider]  Blood Glucose Monitoring Suppl (ONE TOUCH ULTRA 2) w/Device KIT Use as directed 04/06/22   Wynn Banker, MD  fluconazole (DIFLUCAN) 100 MG tablet Take 1 tablet (100 mg total) by mouth daily. Patient not taking: Reported on 11/14/2023 11/08/23   Zehr, Shanda Bumps D, PA-C  glucose blood (ONETOUCH ULTRA) test strip 1 each by Other route 2 (two) times daily. 05/06/23   Karie Georges, MD  HYDROcodone bit-homatropine [facility]) 5-1.5 MG/5ML syrup Take 5 mLs by mouth every 6 (six) hours as needed for cough. 11/14/23   Florene Brill, Barbara Cower, MD  Lancets (ONETOUCH DELICA PLUS LANCET30G) MISC USE TO CHECK BLOOD SUGAR 2 TIMES A DAY  10/04/21   Wynn Banker, MD  levofloxacin (LEVAQUIN) 500 MG tablet Take 1 tablet (500 mg total) by mouth daily. 11/14/23   Dawnyel Leven, Barbara Cower, MD  ONETOUCH ULTRA test strip USE TO CHECK BLOOD SUGAR 2 TIMES A DAY 01/01/22   Koberlein, Paris Lore, MD  promethazine (PHENERGAN) 25 MG tablet Take 1 tablet (25 mg total) by mouth at bedtime. 08/27/23   Hilarie Fredrickson, MD      Allergies    Lodine [etodolac], Oxycontin [oxycodone hcl], Penicillins, Aspirin, Darvocet [propoxyphene n-acetaminophen], Nitroglycerin, Propoxyphene, Tramadol, and Valium    Review of Systems   Review of Systems  Physical Exam Updated Vital Signs BP (!) 109/99 (BP Location: Right Arm)   Pulse 73   Temp 98.5 F (36.9 C) (Oral)   Resp 18   Ht 5\' 6"  (1.676 m)   Wt 96.6 kg   SpO2 98%   BMI 34.38 kg/m  Physical Exam Vitals and nursing note reviewed.  Constitutional:      Appearance: She is well-developed.  HENT:     Head: Normocephalic and atraumatic.     Nose: Congestion present. No rhinorrhea.     Mouth/Throat:     Mouth: Mucous membranes are dry.  Eyes:     Pupils: Pupils are equal, round, and reactive to light.  Cardiovascular:     Rate and Rhythm: Normal rate and regular rhythm.  Pulmonary:     Effort: No respiratory  distress.     Breath sounds: No stridor. Wheezing present.  Abdominal:     General: There is no distension.  Musculoskeletal:        General: No swelling or tenderness. Normal range of motion.     Cervical back: Normal range of motion.  Skin:    General: Skin is warm and dry.  Neurological:     General: No focal deficit present.     Mental Status: She is alert.     ED Results / Procedures / Treatments   Labs (all labs ordered are listed, but only abnormal results are displayed) Labs Reviewed  COMPREHENSIVE METABOLIC PANEL - Abnormal; Notable for the following components:      Result Value   Glucose, Bld 109 (*)    Albumin 3.0 (*)    Anion gap 19 (*)    All other components within normal limits  URINALYSIS, W/ REFLEX TO CULTURE (INFECTION SUSPECTED) - Abnormal; Notable for the following components:   APPearance HAZY (*)    Glucose, UA >=500 (*)    Leukocytes,Ua TRACE (*)    Bacteria, UA RARE (*)    All other components within normal limits  I-STAT CG4 LACTIC ACID, ED - Abnormal; Notable for the following components:   Lactic Acid, Venous 2.0 (*)    All other components within normal limits  RESP PANEL BY RT-PCR (RSV, FLU A&B, COVID)  [id number]  CBC WITH DIFFERENTIAL/PLATELET    EKG None  Radiology DG Chest 2 View Result Date: 11/13/2023 CLINICAL DATA:  Cough. EXAM: CHEST - 2 VIEW COMPARISON:  07/31/2023. FINDINGS: There are heterogeneous faint opacities overlying the right mid lung zone, laterally. These are nonspecific but concerning for underlying focal pneumonitis. Correlate clinically. Remaining bilateral lung fields are otherwise clear. Bilateral costophrenic angles are clear. Stable cardio-mediastinal silhouette. Loop recorder device noted overlying the left lower anterior chest wall. No acute osseous abnormalities. The soft tissues are within normal limits. IMPRESSION: *Heterogeneous faint opacities overlying the right mid lung zone, laterally. These are nonspecific but  concerning for underlying  focal pneumonitis. Correlate clinically. Electronically Signed   By: Jules Schick M.D.   On: 11/13/2023 17:27    Procedures Procedures    Medications Ordered in ED Medications  HYDROcodone bit-homatropine (HYCODAN) 5-1.5 MG/5ML syrup (  Given During Downtime 11/14/23 0229)  levofloxacin (LEVAQUIN) tablet 750 mg (750 mg Oral Given During Downtime 11/14/23 0120)    ED Course/ Medical Decision Making/ A&P                                 Medical Decision Making Risk Prescription drug management.   Secondary to ed logistics, patient ended up being in the ED for >15 hours to complete workup. No hypotension, hypoxia or distress noted. Ambulated without difficulty. Secondary to likely developing pneumonia and h/o same started abx here that would also cover for possible UTI. No indication for further workup or imaging. Patient stable. Offered observation stay in hospital however patient says she felt fine and without any severe abnormalities on labs/VS I don't see a strong indication for same. Will return if not improving and not able to see PCP. Will also return if worsens.    Final Clinical Impression(s) / ED Diagnoses Final diagnoses:  Community acquired pneumonia of right middle lobe of lung    Rx / DC Orders ED Discharge Orders          Ordered    HYDROcodone bit-homatropine (HYCODAN) 5-1.5 MG/5ML syrup  Every 6 hours PRN,   Status:  Discontinued        11/14/23 0536    levofloxacin (LEVAQUIN) 500 MG tablet  Daily,   Status:  Discontinued        11/14/23 0536    HYDROcodone bit-homatropine (HYCODAN) 5-1.5 MG/5ML syrup  Every 6 hours PRN        11/14/23 0541    levofloxacin (LEVAQUIN) 500 MG tablet  Daily        11/14/23 0541              Emmy Keng, Barbara Cower, MD 11/15/23 [phone]

## 2023-11-18 ENCOUNTER — Other Ambulatory Visit: Payer: Self-pay

## 2023-11-19 ENCOUNTER — Ambulatory Visit: Payer: Medicare Other | Admitting: Podiatry

## 2023-11-19 ENCOUNTER — Encounter: Payer: Self-pay | Admitting: Podiatry

## 2023-11-19 DIAGNOSIS — L84 Corns and callosities: Secondary | ICD-10-CM | POA: Diagnosis not present

## 2023-11-19 DIAGNOSIS — M79675 Pain in left toe(s): Secondary | ICD-10-CM

## 2023-11-19 DIAGNOSIS — E1151 Type 2 diabetes mellitus with diabetic peripheral angiopathy without gangrene: Secondary | ICD-10-CM | POA: Diagnosis not present

## 2023-11-19 DIAGNOSIS — B351 Tinea unguium: Secondary | ICD-10-CM | POA: Diagnosis not present

## 2023-11-19 DIAGNOSIS — M79674 Pain in right toe(s): Secondary | ICD-10-CM

## 2023-11-20 ENCOUNTER — Other Ambulatory Visit: Payer: Self-pay

## 2023-11-22 ENCOUNTER — Other Ambulatory Visit: Payer: Self-pay

## 2023-11-22 ENCOUNTER — Ambulatory Visit (INDEPENDENT_AMBULATORY_CARE_PROVIDER_SITE_OTHER): Payer: Medicare Other | Admitting: Family Medicine

## 2023-11-22 ENCOUNTER — Encounter: Payer: Self-pay | Admitting: Family Medicine

## 2023-11-22 VITALS — BP 100/68 | HR 76 | Temp 98.4°F | Ht 66.0 in | Wt 216.6 lb

## 2023-11-22 DIAGNOSIS — R81 Glycosuria: Secondary | ICD-10-CM | POA: Diagnosis not present

## 2023-11-22 DIAGNOSIS — K219 Gastro-esophageal reflux disease without esophagitis: Secondary | ICD-10-CM | POA: Diagnosis not present

## 2023-11-22 DIAGNOSIS — J690 Pneumonitis due to inhalation of food and vomit: Secondary | ICD-10-CM | POA: Diagnosis not present

## 2023-11-22 MED ORDER — GUAIFENESIN 200 MG/5ML PO LIQD
100.0000 mg | ORAL | 0 refills | Status: DC | PRN
  Filled 2023-11-22: qty 118, 8d supply, fill #0

## 2023-11-22 NOTE — Progress Notes (Signed)
Established Patient Office Visit   Subjective  Patient ID: Shelby Anderson, female    DOB: 25-Apr-1956  Age: 67 y.o. MRN: [id number]  Chief Complaint  Patient presents with   Cough    Patient came in today for cough chest pain, Right side pneumia, Bilateral rib pain, Nausea, Vomiting,  ER follow-up, started Dec 3rd, seen on 12/11 and given levaquin and hycodan, patient states she is get worse     Patient is a 67 year old female followed by Dr. Casimiro Needle and seen for ED follow-up.  Patient seen in ED on 11/13/2023.  Thought to have developing pneumonia and questionable UTI.  CXR with pneumonitis in right middle lobe.  Started on Levaquin and given hydrocodone cough syrup. Pt with continued cough, decreased energy, and stabbing pain in upper abdomen/chest.  Denies fever, chills.  Of note prior to ED visit patient seen by GI for nausea and vomiting x 3 days.  Patient notes history of severe GERD, on Pepcid and Nexium daily.  At GI appointment UA noted to have yeast.  Given Diflucan.  Patient later seen at [facility] for abdominal pain, nausea and vomiting and ultimately sent to the ED for further evaluation of symptoms.  Patient also mentions being told she had high levels of glucose in her urine.  Patient has a history of diabetes and is on [place].    Patient Active Problem List   Diagnosis Date Noted   UTI (urinary tract infection) 08/01/2023   Nausea and vomiting 07/31/2023   Acute cystitis 07/31/2023   Hypothyroidism 09/11/2022   Dental caries 06/12/2022   Spondylosis without myelopathy or radiculopathy, cervical region 04/21/2021   Neutropenia with fever (HCC) 10/01/2019   Genetic testing 09/17/2019   Family history of melanoma    Family history of pancreatic cancer    Malignant neoplasm of upper-outer quadrant of left breast in female, estrogen receptor negative (HCC) 09/07/2019   Encounter for loop recorder at end of battery life 04/24/2018   Esophageal stricture 07/01/2017    Hyperlipidemia associated with type 2 diabetes mellitus (HCC) 05/20/2017   Allergic rhinitis 03/11/2017   Dilated cardiomyopathy (HCC) 02/07/2017   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 11/13/2016   Morbid obesity due to excess calories (HCC) 09/26/2016   Increased endometrial stripe thickness 06/27/2016   Intramural leiomyoma of uterus 06/27/2016   Type 2 diabetes mellitus without complication, without long-term current use of insulin (HCC) 12/06/2015   HTN (hypertension), benign 07/19/2015   Arrhythmia 07/19/2015   Orthostatic hypotension 07/19/2015   GERD (gastroesophageal reflux disease) 07/19/2015   Chronic back pain 07/19/2015   Neuropathy 07/19/2015   Symptomatic PVCs 11/02/2014   Syncope 11/02/2014   Status post placement of implantable loop recorder 11/02/2014   Stenosis of cervix 01/07/2014   Chest pain 12/23/2013   Disorder of cervix 03/10/2013   Vaginal atrophy 03/10/2013   OSA (obstructive sleep apnea) 07/31/2012   Asthma 04/29/2012   Coronary artery disease 11/20/2011   Past Medical History:  Diagnosis Date   Allergy    Anemia    "chronic"   Angina    Prinzmetal angina   Anxiety and depression    Arthritis    Asthma    Atrial fibrillation (HCC)    h/o "AF w/frequent PVCs"   Breast cancer (HCC)    Left   Cancer (HCC)    hx of skin cancer    CHF (congestive heart failure) (HCC)    Chronic back pain greater than 3 months duration  on chronic narcotics, treated at pain clinic   Chronic respiratory failure (HCC) 09/15/2015   ONO 09/04/15 + desats >begin O2 at 2l/ m    Colon polyps    hyperplastic   Coronary artery disease    Arrythmia, orthostatic hypotension, HLD, HTN; sees Dr. Jacinto Halim   Difficult intubation    "TMJ & woke up when they were still cutting on me"   Dysrhythmia    sees Dr. Jacinto Halim and a cardiologist at [facility]   Esophageal stricture    Family history of melanoma ]   Family history of pancreatic cancer    Fatty liver    Fatty  liver    Fibroids    Fibromyalgia    "in my legs"   GERD (gastroesophageal reflux disease)    hx hiatal hernia, stricture and gastric ulcer   Headache(784.0)    migraines   Heart murmur    Hiatal hernia    History of loop recorder    History of migraines    "dx'd when I was in my teens"   Hyperlipemia    Hypertension    Mental disorder    Mild episode of recurrent major depressive disorder (HCC) 12/06/2015   Myocardial infarction [facility] 1980's & 1990;   sees Dr. 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 asthma   Stenotic cervical os    Stomach ulcer    "3 small; found in 05/2011"   TMJ (dislocation of temporomandibular joint)    Tuberculosis    + TB SKIN TEST   Type 2 diabetes mellitus without complication, without long-term current use of insulin (HCC) 12/06/2015   not on meds    Past Surgical History:  Procedure Laterality Date   ACHILLES TENDON REPAIR  1970's   left ankle   ARTHROSCOPIC REPAIR ACL     left knee cap   BREAST BIOPSY Right 04/08/2013   again in October or November 2020   BREAST LUMPECTOMY Left 12/09/2019   BREAST LUMPECTOMY WITH RADIOACTIVE SEED AND SENTINEL LYMPH NODE BIOPSY Left 12/09/2019   Procedure: LEFT BREAST LUMPECTOMY WITH RADIOACTIVE SEED AND LEFT AXILLARY SENTINEL LYMPH NODE BIOPSY;  Surgeon: Emelia Loron, MD;  Location: [facility] OR;  Service: General;  Laterality: Left;  PEC BLOCK   CARDIAC CATHETERIZATION     loop recorder   CARPAL TUNNEL RELEASE  unknown   left hand   COLONOSCOPY     ESOPHAGOGASTRODUODENOSCOPY (EGD) WITH PROPOFOL N/A 10/29/2017   Procedure: ESOPHAGOGASTRODUODENOSCOPY (EGD) WITH PROPOFOL;  Surgeon: Hilarie Fredrickson, MD;  Location: [facility]  ENDOSCOPY;  Service: Endoscopy;  Laterality: N/A;   LOOP RECORDER IMPLANT     PORT-A-CATH REMOVAL N/A 12/09/2019   Procedure: REMOVAL PORT-A-CATH;  Surgeon: Emelia Loron, MD;  Location: [facility] OR;  Service: General;  Laterality: N/A;   PORTACATH PLACEMENT N/A 09/23/2019   Procedure: INSERTION PORT-A-CATH Right Internal Feliberto Harts;  Surgeon: Emelia Loron, MD;  Location: [facility] OR;  Service: General;  Laterality: N/A;   post ganglionectomy  1970's   "for migraine headaches"   pouch string  16,10,96   "did this 3 times (once w/each pregnancy)"   SAVORY DILATION N/A 10/29/2017   Procedure:  SAVORY DILATION;  Surgeon: Hilarie Fredrickson, MD;  Location: [facility]COPY;  Service: Endoscopy;  Laterality: N/A;   TOTAL KNEE ARTHROPLASTY Left 09/25/2016   Procedure: LEFT TOTAL KNEE ARTHROPLASTY;  Surgeon: Durene Romans, MD;  Location: [facility] ORS;  Service: Orthopedics;  Laterality: Left;   TUBAL LIGATION  1980's   Social History   Tobacco Use   Smoking status: Never   Smokeless tobacco: Never  Vaping Use   Vaping status: Never Used  Substance Use Topics   Alcohol use: No    Alcohol/week: 0.0 standard drinks of alcohol   Drug use: No   Family History  Problem Relation Age of Onset   Malignant hyperthermia Father    Hypertension Father    Heart disease Father    Diabetes Father    Cancer Father        skin   Hypertension Mother    Heart disease Mother    Multiple myeloma Mother    Cancer Sister        CERVICAL   Hypertension Sister    Cancer Brother 22       MELANOMA   Heart disease Maternal Grandmother    Other Maternal Grandmother 32       complications of childbirth   Heart disease Maternal Grandfather    Cancer Paternal Grandmother        ?    Heart disease Paternal Grandmother    Heart disease Paternal Grandfather    Cancer Brother        LUNG   Diabetes Sister    Hypertension Sister    Heart disease Sister    Cancer Sister    Cancer Brother    Pancreatic cancer  Niece 27   Cancer Nephew 40       unknown- currently in the [employer]   Anesthesia problems Neg Hx    Hypotension Neg Hx    Pseudochol deficiency Neg Hx    Colon cancer Neg Hx    Esophageal cancer Neg Hx    Rectal cancer Neg Hx    Stomach cancer Neg Hx    Breast cancer Neg Hx    Allergies  Allergen Reactions   Lodine [Etodolac] Anaphylaxis, Hives and Swelling   Oxycontin [Oxycodone Hcl] Anaphylaxis    hives, trouble breathing, tongue swelling (Only Oxycontin) Tolerates plain oxycodone.   Penicillins Anaphylaxis    Told by a surgeon never to take it again. Has patient had a PCN reaction causing immediate rash, facial/tongue/throat swelling, SOB or lightheadedness with hypotension: Yes Has patient had a PCN reaction causing severe rash involving mucus membranes or skin necrosis: Unknown Has patient had a PCN reaction that required hospitalization: No Has patient had a PCN reaction occurring within the last 10 years: No If all of the above answers are "NO", then may proceed with Cephalosporin use.   Aspirin Other (See Comments)    High-dose caused GI Bleeds   Darvocet [Propoxyphene N-Acetaminophen] Hives   Nitroglycerin Other (See Comments)    IV-BP drops dramatically Can take po   Propoxyphene Hives   Tramadol Hives and Itching   Valium Other (See Comments)    Circulation problems. "Legs turned black".      ROS Negative unless stated above    Objective:     BP 100/68 (BP Location: Right Arm, Patient Position: Sitting, Cuff Size: Large)   Pulse 76   Temp 98.4 F (36.9 C) (Oral)   Ht 5\' 6"  (1.676 m)   Wt 216 lb 9.6 oz (98.2  kg)   SpO2 94%   BMI 34.96 kg/m  BP Readings from Last 3 Encounters:  11/22/23 100/68  11/14/23 (!) 109/99  11/13/23 93/65   Wt Readings from Last 3 Encounters:  11/22/23 216 lb 9.6 oz (98.2 kg)  11/13/23 213 lb (96.6 kg)  11/08/23 213 lb (96.6 kg)      Physical Exam Constitutional:      General: She is not in acute distress.     Appearance: Normal appearance. She is obese. She is not ill-appearing or toxic-appearing.  HENT:     Head: Normocephalic and atraumatic.     Right Ear: Tympanic membrane normal.     Left Ear: Tympanic membrane normal.     Nose: Nose normal.     Mouth/Throat:     Mouth: Mucous membranes are moist.  Cardiovascular:     Rate and Rhythm: Normal rate and regular rhythm.     Heart sounds: Normal heart sounds. No murmur heard.    No gallop.  Pulmonary:     Effort: Pulmonary effort is normal. No respiratory distress.     Breath sounds: Examination of the right-middle field reveals rhonchi. Examination of the right-lower field reveals rhonchi. Examination of the left-lower field reveals rhonchi. Rhonchi present. No wheezing or rales.  Abdominal:     Palpations: Abdomen is soft.     Tenderness: There is abdominal tenderness.  Musculoskeletal:     Comments: TTP of anterior chest wall and lower rib cage bilaterally.  Lymphadenopathy:     Cervical: No cervical adenopathy.  Skin:    General: Skin is warm and dry.  Neurological:     Mental Status: She is alert and oriented to person, place, and time.      No results found for any visits on 11/22/23.    Assessment & Plan:  Aspiration pneumonia of right middle lobe, unspecified aspiration pneumonia type (HCC) -     guaiFENesin; Take 2.5 mLs (100 mg total) by mouth every 4 (four) hours as needed.  Dispense: 118 mL; Refill: 0  Glucosuria  Gastroesophageal reflux disease without esophagitis  Seen for ED follow-up on pneumonia.  Pneumonia likely due to aspiration given history of severe GERD rather than CAP.  CXR from ED visit on 11/13/2023 reviewed: Heterogeneous faint opacities overlying the right middle lung zones laterally.  These are nonspecific but concerning for underlying focal pneumonitis.  Complete ABX course.  Guaifenesin cough syrup sent to pharmacy.  Patient advised on likely duration of symptoms/return to baseline.  Soreness in  chest 2/2 increased use of intercostal muscles.  Per chart review glucosuria on 12/6 and 12/11 1005 100 respectively.  Likely 2/2 use of Farxiga.  Yeast in urine on 11/08/2023 also likely due to [place].  Continue to monitor.  Return if symptoms worsen or fail to improve.   Deeann Saint, MD

## 2023-11-25 ENCOUNTER — Other Ambulatory Visit (HOSPITAL_COMMUNITY): Payer: Self-pay

## 2023-11-25 ENCOUNTER — Other Ambulatory Visit: Payer: Self-pay

## 2023-11-25 ENCOUNTER — Telehealth: Payer: Self-pay

## 2023-11-25 ENCOUNTER — Other Ambulatory Visit: Payer: Self-pay | Admitting: Family Medicine

## 2023-11-25 DIAGNOSIS — E039 Hypothyroidism, unspecified: Secondary | ICD-10-CM

## 2023-11-25 MED ORDER — L-METHYLFOLATE FORTE 7.5-90.314 MG PO CAPS
1.0000 | ORAL_CAPSULE | Freq: Every day | ORAL | 4 refills | Status: DC
  Filled 2023-11-25: qty 30, 30d supply, fill #0

## 2023-11-25 MED ORDER — LISINOPRIL 5 MG PO TABS
5.0000 mg | ORAL_TABLET | Freq: Every morning | ORAL | 2 refills | Status: DC
  Filled 2023-11-25: qty 30, 30d supply, fill #0
  Filled 2023-12-02 – 2023-12-19 (×3): qty 30, 30d supply, fill #1
  Filled 2024-01-07 – 2024-01-14 (×2): qty 30, 30d supply, fill #2

## 2023-11-25 MED ORDER — BUPROPION HCL ER (XL) 150 MG PO TB24
150.0000 mg | ORAL_TABLET | Freq: Every day | ORAL | 2 refills | Status: DC
  Filled 2023-11-25: qty 30, 30d supply, fill #0
  Filled 2023-12-02 – 2023-12-19 (×4): qty 30, 30d supply, fill #1
  Filled 2024-01-07 – 2024-01-14 (×2): qty 30, 30d supply, fill #2

## 2023-11-25 MED FILL — Glipizide Tab ER 24HR 5 MG: ORAL | 30 days supply | Qty: 30 | Fill #0 | Status: AC

## 2023-11-25 MED FILL — Levothyroxine Sodium Tab 75 MCG: ORAL | 30 days supply | Qty: 30 | Fill #0 | Status: AC

## 2023-11-25 NOTE — Telephone Encounter (Signed)
Copied from CRM [id number]. Topic: Clinical - Medication Refill >> Nov 25, 2023 12:36 PM Elizebeth Brooking wrote: Most Recent Primary Care Visit:  Provider: Tillie Rung  Department: [facility]  Visit Type: MEDICARE AWV, SEQUENTIAL  Date: 09/27/2023  Medication: ***  Has the patient contacted their pharmacy?  (Agent: If no, request that the patient contact the pharmacy for the refill. If patient does not wish to contact the pharmacy document the reason why and proceed with request.) (Agent: If yes, when and what did the pharmacy advise?)  Is this the correct pharmacy for this prescription?  If no, delete pharmacy and type the correct one.  This is the patient's preferred pharmacy:  [facility] [address] [place] [address] Phone: [phone] Fax: [phone]  [id number] [place], [place] - [address] [place] [address] Phone: [phone] Fax: [phone]   Has the prescription been filled recently?   Is the patient out of the medication?   Has the patient been seen for an appointment in the last year OR does the patient have an upcoming appointment?   Can we respond through MyChart?   Agent: Please be advised that Rx refills may take up to 3 business days. We ask that you follow-up with your pharmacy.

## 2023-11-25 NOTE — Telephone Encounter (Signed)
Copied from CRM [id number]. Topic: Clinical - Prescription Issue >> Nov 25, 2023 10:52 AM Corin V wrote: Reason for CRM: Patient called while at the pharmacy. Her Rx for the Guaifenesin 200 MG/5ML LIQD [facility]  [facility]. Patient is waiting at pharmacy and asked if the script could be called in or reset ASAP.

## 2023-11-25 NOTE — Telephone Encounter (Signed)
Spoke with the patient and informed her Dr Salomon Fick sent the Rx to [facility], since she is not in the office, we are unable to change this and offered to inform her of the address.  Patient stated she is currently at the [facility] pharmacy now.

## 2023-11-26 ENCOUNTER — Other Ambulatory Visit (HOSPITAL_COMMUNITY): Payer: Self-pay

## 2023-11-26 ENCOUNTER — Other Ambulatory Visit: Payer: Self-pay

## 2023-11-28 NOTE — Progress Notes (Signed)
Subjective:  Patient ID: Shelby Anderson, female    DOB: 1956-02-28,  MRN: [id number]  Shelby Anderson presents to clinic today for at risk foot care. Pt has h/o NIDDM with PAD and callus(es) of both feet and painful thick toenails that are difficult to trim. Painful toenails interfere with ambulation. Aggravating factors include wearing enclosed shoe gear. Pain is relieved with periodic professional debridement. Painful calluses are aggravated when weightbearing with and without shoegear. Pain is relieved with periodic professional debridement.   Patient c/o about medial border left great toe. States she notices dried drainage from that border since procedure was done. She feels like there is still a remnant of nail in the border.  Chief Complaint  Patient presents with   Diabetes    Patient states she saw her pcp 3 months ago , patient does not remember her A1C she has not ad it done since the beginning of the year   New problem(s): None.   PCP is Karie Georges, MD.  Allergies  Allergen Reactions   Lodine [Etodolac] Anaphylaxis, Hives and Swelling   Oxycontin [Oxycodone Hcl] Anaphylaxis    hives, trouble breathing, tongue swelling (Only Oxycontin) Tolerates plain oxycodone.   Penicillins Anaphylaxis    Told by a surgeon never to take it again. Has patient had a PCN reaction causing immediate rash, facial/tongue/throat swelling, SOB or lightheadedness with hypotension: Yes Has patient had a PCN reaction causing severe rash involving mucus membranes or skin necrosis: Unknown Has patient had a PCN reaction that required hospitalization: No Has patient had a PCN reaction occurring within the last 10 years: No If all of the above answers are "NO", then may proceed with Cephalosporin use.   Aspirin Other (See Comments)    High-dose caused GI Bleeds   Darvocet [Propoxyphene N-Acetaminophen] Hives   Nitroglycerin Other (See Comments)    IV-BP drops dramatically Can take po    Propoxyphene Hives   Tramadol Hives and Itching   Valium Other (See Comments)    Circulation problems. "Legs turned black".    Review of Systems: Negative except as noted in the HPI.  Objective: No changes noted in today's physical examination. There were no vitals filed for this visit. DAJAHNAE Anderson is a pleasant 67 y.o. female in NAD. AAO x 3.   Vascular Examination: CFT <3 seconds b/l. DP pulses faintly palpable b/l. PT pulses nonpalpable b/l. Digital hair absent. Skin temperature gradient warm to warm b/l. No pain with calf compression. No ischemia or gangrene. No cyanosis or clubbing noted b/l.    Neurological Examination: Sensation grossly intact b/l with 10 gram monofilament. Vibratory sensation intact b/l. Pt has subjective symptoms of neuropathy.  Dermatological Examination: Pedal skin warm and supple b/l. No open wounds b/l. No interdigital macerations. Toenails  2-5 b/l thick, discolored, elongated with subungual debris and pain on dorsal palpation.    Anonychia left great toe with area of dried heme. No erythema, no edema, no drainage expressed, no underlying wound noted.   Anonychia right great toe. Nailbed intact.  Incurvated nailplate medial border R 5th toe.  Nail border hypertrophy absent. There is tenderness to palpation. Sign(s) of infection: no clinical signs of infection noted on examination today.   Hyperkeratotic lesion(s) submet head 5 b/l.  No erythema, no edema, no drainage, no fluctuance.  Musculoskeletal Examination: Muscle strength 5/5 to all lower extremity muscle groups bilaterally. Tailor's bunion deformity noted right lower extremity. Hammertoe deformity noted 1-5 b/l. Patient ambulates independent of any assistive  aids.  Radiographs: None  Assessment/Plan: 1. Pain due to onychomycosis of toenails of both feet   2. Callus   3. Hx of toe surgery   4. Diabetes mellitus type 2 with peripheral artery disease (HCC)     -Consent given for  treatment as described below: -Examined patient. -Curretaged medial border left hallux. Cleansed with alcohol and TAO applied. Patient to schedule appt with Dr. Allena Katz to discuss further management. In the meantime, continue Neosporin or antibiotic cream to toe once daily. -Continue supportive shoe gear daily. -Mycotic toenails 2-5 bilaterally were debrided in length and girth with sterile nail nippers and dremel without iatrogenic bleeding. -Callus(es) submet head 5 b/l pared utilizing sterile scalpel blade without complication or incident. Total number debrided =2. -Patient/POA to call should there be question/concern in the interim.   Return in about 3 months (around 02/17/2024).  Freddie Breech, DPM      [place] LOCATION: [address] [place] [address]                   Office [phone]   [facility] LOCATION: [address] [place], [place] [address] Office [phone]

## 2023-12-02 ENCOUNTER — Other Ambulatory Visit (HOSPITAL_COMMUNITY): Payer: Self-pay

## 2023-12-02 ENCOUNTER — Other Ambulatory Visit: Payer: Self-pay

## 2023-12-02 MED FILL — Levothyroxine Sodium Tab 75 MCG: ORAL | 30 days supply | Qty: 30 | Fill #1 | Status: CN

## 2023-12-02 MED FILL — Glipizide Tab ER 24HR 5 MG: ORAL | 30 days supply | Qty: 30 | Fill #1 | Status: CN

## 2023-12-02 NOTE — Patient Outreach (Signed)
  Care Management   Visit Note   Name: Shelby Anderson MRN: [id number] DOB: September 30, 1956  Subjective: Shelby Anderson is a 67 y.o. year old female who is a primary care patient of Karie Georges, MD. The Care Management team was consulted for assistance.      Engaged with Shelby Anderson  Assessment:  Review of patient past medical history, allergies, medications, health status, including review of consultants reports, laboratory and other test data, was performed as part of  evaluation and provision of care management services.

## 2023-12-11 ENCOUNTER — Other Ambulatory Visit: Payer: Self-pay

## 2023-12-14 ENCOUNTER — Other Ambulatory Visit (HOSPITAL_COMMUNITY): Payer: Self-pay

## 2023-12-18 ENCOUNTER — Other Ambulatory Visit (HOSPITAL_COMMUNITY): Payer: Self-pay

## 2023-12-18 ENCOUNTER — Other Ambulatory Visit: Payer: Self-pay

## 2023-12-18 MED FILL — Levothyroxine Sodium Tab 75 MCG: ORAL | 30 days supply | Qty: 30 | Fill #1 | Status: CN

## 2023-12-18 MED FILL — Glipizide Tab ER 24HR 5 MG: ORAL | 30 days supply | Qty: 30 | Fill #1 | Status: CN

## 2023-12-19 ENCOUNTER — Other Ambulatory Visit: Payer: Self-pay

## 2023-12-19 ENCOUNTER — Other Ambulatory Visit (HOSPITAL_COMMUNITY): Payer: Self-pay

## 2023-12-19 MED ORDER — LEVOTHYROXINE SODIUM 75 MCG PO TABS
75.0000 ug | ORAL_TABLET | Freq: Every day | ORAL | 4 refills | Status: DC
  Filled 2023-12-19 (×2): qty 30, 30d supply, fill #0
  Filled 2024-01-07 – 2024-01-14 (×2): qty 30, 30d supply, fill #1
  Filled 2024-01-28 – 2024-02-11 (×2): qty 30, 30d supply, fill #2
  Filled 2024-02-17 – 2024-03-10 (×3): qty 30, 30d supply, fill #3
  Filled 2024-03-23 – 2024-04-02 (×2): qty 30, 30d supply, fill #4

## 2023-12-19 MED ORDER — GLIPIZIDE ER 5 MG PO TB24
5.0000 mg | ORAL_TABLET | Freq: Every morning | ORAL | 3 refills | Status: DC
  Filled 2023-12-19: qty 30, 30d supply, fill #0

## 2023-12-19 MED FILL — Glipizide Tab ER 24HR 5 MG: ORAL | 30 days supply | Qty: 30 | Fill #1 | Status: CN

## 2023-12-19 MED FILL — Levothyroxine Sodium Tab 75 MCG: ORAL | 30 days supply | Qty: 30 | Fill #1 | Status: CN

## 2023-12-20 ENCOUNTER — Other Ambulatory Visit (HOSPITAL_COMMUNITY): Payer: Self-pay

## 2023-12-20 ENCOUNTER — Other Ambulatory Visit: Payer: Self-pay

## 2023-12-21 ENCOUNTER — Other Ambulatory Visit (HOSPITAL_COMMUNITY): Payer: Self-pay

## 2024-01-02 ENCOUNTER — Other Ambulatory Visit (HOSPITAL_COMMUNITY): Payer: Self-pay

## 2024-01-02 DIAGNOSIS — G894 Chronic pain syndrome: Secondary | ICD-10-CM | POA: Diagnosis not present

## 2024-01-02 DIAGNOSIS — Z79891 Long term (current) use of opiate analgesic: Secondary | ICD-10-CM | POA: Diagnosis not present

## 2024-01-02 DIAGNOSIS — M47816 Spondylosis without myelopathy or radiculopathy, lumbar region: Secondary | ICD-10-CM | POA: Diagnosis not present

## 2024-01-02 DIAGNOSIS — G4700 Insomnia, unspecified: Secondary | ICD-10-CM | POA: Diagnosis not present

## 2024-01-02 MED ORDER — TIZANIDINE HCL 4 MG PO TABS
8.0000 mg | ORAL_TABLET | Freq: Three times a day (TID) | ORAL | 2 refills | Status: DC
  Filled 2024-01-02 – 2024-01-07 (×2): qty 540, 90d supply, fill #0
  Filled 2024-01-14: qty 180, 30d supply, fill #0
  Filled 2024-01-28 – 2024-02-11 (×2): qty 180, 30d supply, fill #1
  Filled 2024-02-17 – 2024-03-10 (×3): qty 180, 30d supply, fill #2
  Filled 2024-03-23 – 2024-04-02 (×2): qty 180, 30d supply, fill #3
  Filled 2024-04-20 – 2024-04-28 (×2): qty 180, 30d supply, fill #4
  Filled 2024-05-13 – 2024-06-04 (×2): qty 180, 30d supply, fill #5
  Filled 2024-07-07: qty 180, 30d supply, fill #6
  Filled 2024-08-04: qty 180, 30d supply, fill #7

## 2024-01-02 MED ORDER — PREGABALIN 150 MG PO CAPS
150.0000 mg | ORAL_CAPSULE | Freq: Three times a day (TID) | ORAL | 2 refills | Status: DC
  Filled 2024-01-02 – 2024-01-27 (×3): qty 90, 30d supply, fill #0
  Filled 2024-02-27: qty 90, 30d supply, fill #1
  Filled 2024-05-01: qty 90, 30d supply, fill #2

## 2024-01-06 ENCOUNTER — Inpatient Hospital Stay: Payer: Medicare Other | Attending: Hematology and Oncology | Admitting: Hematology and Oncology

## 2024-01-06 VITALS — BP 108/62 | HR 62 | Temp 98.2°F | Resp 18 | Ht 66.0 in | Wt 217.0 lb

## 2024-01-06 DIAGNOSIS — Z923 Personal history of irradiation: Secondary | ICD-10-CM | POA: Insufficient documentation

## 2024-01-06 DIAGNOSIS — C50412 Malignant neoplasm of upper-outer quadrant of left female breast: Secondary | ICD-10-CM

## 2024-01-06 DIAGNOSIS — R42 Dizziness and giddiness: Secondary | ICD-10-CM | POA: Diagnosis not present

## 2024-01-06 DIAGNOSIS — Z853 Personal history of malignant neoplasm of breast: Secondary | ICD-10-CM | POA: Insufficient documentation

## 2024-01-06 DIAGNOSIS — Z171 Estrogen receptor negative status [ER-]: Secondary | ICD-10-CM

## 2024-01-06 DIAGNOSIS — R519 Headache, unspecified: Secondary | ICD-10-CM | POA: Insufficient documentation

## 2024-01-06 NOTE — Progress Notes (Signed)
Patient Care Team: Karie Georges, MD as PCP - General (Family Medicine) Oretha Milch, MD as Consulting Physician (Pulmonary Disease) Yates Decamp, MD as Consulting Physician (Cardiology) Orma Flaming, MD as Referring Physician (Obstetrics and Gynecology) Garen Grams, MD as Referring Physician (Cardiology) Thyra Breed, MD as Consulting Physician (Anesthesiology) Emelia Loron, MD as Consulting Physician (General Surgery) Serena Croissant, MD as Consulting Physician (Hematology and Oncology) Dorothy Puffer, MD as Consulting Physician (Radiation Oncology) Glendale Chard, DO as Consulting Physician (Neurology) Donzetta Starch, MD as Consulting Physician (Dermatology) Juanell Fairly, RN as Case Manager Hilarie Fredrickson, MD as Consulting Physician (Gastroenterology) [name], [facility] (Pharmacist)  DIAGNOSIS:  Encounter Diagnosis  Name Primary?   Malignant neoplasm of upper-outer quadrant of left breast in female, estrogen receptor negative (HCC) Yes    SUMMARY OF ONCOLOGIC HISTORY: Oncology History  Malignant neoplasm of upper-outer quadrant of left breast in female, estrogen receptor negative (HCC)  09/07/2019 Initial Diagnosis   Routine screening mammogram detected a 2.1cm mass in the left breast and no left axillary adenopathy. 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   09/15/2019 Genetic Testing   Genetic testing reported out on September 15, 2019 through the Multi-cancer panel found no pathogenic mutations. The Multi-Gene Panel offered by [facility] includes sequencing and/or deletion duplication testing of the following 85 genes: AIP, ALK, APC, ATM, AXIN2,BAP1,  BARD1, BLM, BMPR1A, BRCA1, BRCA2, BRIP1, CASR, CDC73, CDH1, CDK4, CDKN1B, CDKN1C, CDKN2A (p14ARF), CDKN2A (p16INK4a), CEBPA, CHEK2, CTNNA1, DICER1, DIS3L2, EGFR (c.2369C>T, p.Thr790Met variant only), EPCAM (Deletion/duplication testing only), FH, FLCN, GATA2, GPC3, GREM1 (Promoter  region deletion/duplication testing only), HOXB13 (c.251G>A, p.Gly84Glu), HRAS, KIT, MAX, MEN1, MET, MITF (c.952G>A, p.Glu318Lys variant only), MLH1, MSH2, MSH3, MSH6, MUTYH, NBN, NF1, NF2, NTHL1, PALB2, PDGFRA, PHOX2B, PMS2, POLD1, POLE, POT1, PRKAR1A, PTCH1, PTEN, RAD50, RAD51C, RAD51D, RB1, RECQL4, RET, RNF43, RUNX1, SDHAF2, SDHA (sequence changes only), SDHB, SDHC, SDHD, SMAD4, SMARCA4, SMARCB1, SMARCE1, STK11, SUFU, TERC, TERT, TMEM127, TP53, TSC1, TSC2, VHL, WRN and WT1.  The test report has been scanned into EPIC and is located under the Molecular Pathology section of the Results Review tab.  A portion of the result report is included below for reference.    09/24/2019 - 10/15/2019 Chemotherapy   palonosetron (ALOXI) injection 0.25 mg, 0.25 mg, Intravenous,  Once, 2 of 6 cycles. Administration: 0.25 mg (09/24/2019), 0.25 mg (10/15/2019)  pegfilgrastim (NEULASTA ONPRO KIT) injection 6 mg, 6 mg, Subcutaneous, Once, 2 of 6 cycles. Administration: 6 mg (09/24/2019), 6 mg (10/15/2019)  cyclophosphamide (CYTOXAN) 1,100 mg in sodium chloride 0.9 % 250 mL chemo infusion, 500 mg/m2 = 1,100 mg (83.3 % of original dose 600 mg/m2), Intravenous,  Once, 2 of 6 cycles. Dose modification: 500 mg/m2 (original dose 600 mg/m2, Cycle 1, Reason: Provider Judgment). Administration: 1,100 mg (09/24/2019), 1,100 mg (10/15/2019)  DOCEtaxel (TAXOTERE) 140 mg in sodium chloride 0.9 % 250 mL chemo infusion, 65 mg/m2 = 140 mg (86.7 % of original dose 75 mg/m2), Intravenous,  Once, 2 of 6 cycles. Dose modification: 65 mg/m2 (original dose 75 mg/m2, Cycle 1, Reason: Provider Judgment). Administration: 140 mg (09/24/2019), 140 mg (10/15/2019)  fosaprepitant (EMEND) 150 mg in sodium chloride 0.9 % 145 mL IVPB, Intravenous,  Once, 2 of 6 cycles. Administration:  (09/24/2019),  (10/15/2019)   12/09/2019 Surgery   Left lumpectomy Dwain Sarna) [phone]): microscopic focus of residual IDC, grade 3, with high grade DCIS, clear  margins. No regional lymph nodes were examined.   12/10/2019  Cancer Staging   Staging form: Breast, AJCC 8th Edition - Pathologic stage from 12/10/2019: No Stage Recommended (ypT1a, pN0, cM0) - Signed by Loa Socks, NP on 12/23/2019   01/13/2020 - 02/08/2020 Radiation Therapy   The patient initially received a dose of 42.56 Gy in 16 fractions to the breast using whole-breast tangent fields. This was delivered using a 3-D conformal technique. The pt received a boost delivering an additional 8 Gy in 4 fractions using a electron boost with electrons. The total dose was 50.56 Gy.     CHIEF COMPLIANT: Surveillance of breast cancer.  HISTORY OF PRESENT ILLNESS:  History of Present Illness   The patient is a 68 year old female with a history of breast cancer who presents with headaches and lightheadedness.  She experiences headaches and a sensation of lightheadedness, particularly when standing up or turning. Her vision becomes blurry when focusing on objects. These symptoms occur intermittently and usually coincide with high blood pressure, although this time her blood pressure was low.  She has a history of breast cancer diagnosed almost five years ago and underwent radiation therapy to the breast and axillary area. She continues to experience soreness in these areas, describing it as painful to touch.  She had a recent episode where she felt unwell and went to the ER, suspecting COVID-19. She was tested for RSV, COVID-19, and flu, all of which were negative, but was diagnosed with pneumonia. She was given medications during this ER visit, which have since been completed.  She mentions difficulty swallowing medications with water, preferring to take them with ginger ale to prevent them from sticking in her throat.         ALLERGIES:  is allergic to lodine [etodolac], oxycontin [oxycodone hcl], penicillins, aspirin, darvocet [propoxyphene n-acetaminophen], nitroglycerin,  propoxyphene, tramadol, and valium.  MEDICATIONS:  Current Outpatient Medications  Medication Sig Dispense Refill   albuterol (PROVENTIL) (2.5 MG/3ML) 0.083% nebulizer solution Inhale 3 mLs (2.5 mg total) by nebulization every 6 (six) hours as needed for wheezing or shortness of breath (J45.40). 75 mL 5   albuterol (VENTOLIN HFA) 108 (90 Base) MCG/ACT inhaler Inhale 1-2 puffs into the lungs every 4 (four) hours as needed for wheezing or shortness of breath. 6.7 g 3   aspirin EC 81 MG tablet Take 81 mg at bedtime by mouth.      atorvastatin (LIPITOR) 80 MG tablet TAKE ONE TABLET BY MOUTH EVERYDAY AT BEDTIME Needs appointment for further refills 90 tablet 1   Blood Glucose Monitoring Suppl (ONE TOUCH ULTRA 2) w/Device KIT Use as directed 1 kit 1   budesonide-formoterol (SYMBICORT) 80-4.5 MCG/ACT inhaler Inhale 2 puffs into the lungs every morning and another 2 puffs 12 hours later. 30.6 g 3   buPROPion (WELLBUTRIN XL) 150 MG 24 hr tablet Take 1 tablet (150 mg total) by mouth daily. 30 tablet 2   Calcium Carbonate-Vitamin D 600-200 MG-UNIT TABS Take 1 tablet by mouth daily.     Cholecalciferol (VITAMIN D3) 50 MCG (2000 UT) capsule Take 2,000 Units by mouth daily.     CINNAMON PO Take 1,000 mg 2 (two) times daily by mouth.     dapagliflozin propanediol (FARXIGA) 10 MG TABS tablet Take 1 tablet (10 mg total) by mouth daily before breakfast. 90 tablet 1   doxepin (SINEQUAN) 50 MG capsule Take 1 capsule (50 mg total) by mouth at bedtime. 30 capsule 2   esomeprazole (NEXIUM) 40 MG capsule Take 1 capsule (40 mg total) by mouth  2 (two) times daily. Office visit for further refills 180 capsule 1   famotidine (PEPCID) 20 MG tablet Take 1 tablet (20 mg total) by mouth 2 (two) times daily. 180 tablet 1   fluticasone (FLONASE) 50 MCG/ACT nasal spray Place 1 spray into both nostrils at bedtime.     furosemide (LASIX) 20 MG tablet Take 1 tablet (20 mg total) by mouth daily as needed for ankle swelling. 180 tablet  1   glucose blood (ONETOUCH ULTRA) test strip 1 each by Other route 2 (two) times daily. 100 strip 3   hydrALAZINE (APRESOLINE) 25 MG tablet Take 1 tablet (25 mg total) by mouth daily as needed for blood pressure >140/87mmhg, up to three times a day 30 tablet 10   isosorbide mononitrate (IMDUR) 60 MG 24 hr tablet TAKE ONE TABLET BY MOUTH EVERY MORNING Needs appointment for further refills 90 tablet 1   L-Methylfolate (DEPLIN) 7.5 MG TABS Take 7.5 mg by mouth daily with breakfast.     L-Methylfolate-Algae (L-METHYLFOLATE FORTE) 7.5-90.314 MG CAPS Take 1 capsule by mouth daily. 30 capsule 4   Lancets (ONETOUCH DELICA PLUS LANCET30G) MISC USE TO CHECK BLOOD SUGAR 2 TIMES A DAY 100 each 1   levothyroxine (SYNTHROID) 75 MCG tablet Take 1 tablet (75 mcg total) by mouth daily before breakfast. 30 tablet 4   lisinopril (ZESTRIL) 5 MG tablet Take 1 tablet (5 mg total) by mouth every morning. 30 tablet 2   LYRICA 150 MG capsule Take 150 mg by mouth 3 (three) times daily.   2   metFORMIN (GLUCOPHAGE) 500 MG tablet Take 1 tablet (500 mg total) by mouth 2 (two) times daily with a meal. 60 tablet 4   metoprolol succinate (TOPROL-XL) 25 MG 24 hr tablet Take 1 tablet (25 mg total) by mouth daily. TAKE WITH OR IMMEDIATELY FOLLOWING A MEAL. 90 tablet 3   Misc Natural Products (GLUCOS-CHONDROIT-MSM COMPLEX PO) Take 2 tablets by mouth daily.     mometasone (ELOCON) 0.1 % cream Apply 1 Application topically daily as needed (for redness/raw). Apply topically behind ears     montelukast (SINGULAIR) 10 MG tablet Take 1 tablet (10 mg total) by mouth at bedtime. 90 tablet 3   Multiple Vitamin (MULITIVITAMIN WITH MINERALS) TABS Take 1 tablet by mouth daily.     nitroGLYCERIN (NITROSTAT) 0.4 MG SL tablet Place 1 tablet under the tongue every 5 minutes as needed for chest pain up to 3 times. If no relief, call 911. 25 tablet 4   NURTEC 75 MG TBDP Take 1 each by mouth daily as needed. (Patient taking differently: Take 75 mg by  mouth daily as needed (for migraines).) 2 tablet 0   ondansetron (ZOFRAN) 4 MG tablet Take 1 tablet (4 mg total) by mouth every 8 (eight) hours as needed for nausea or vomiting. 80 tablet 2   ONETOUCH ULTRA test strip USE TO CHECK BLOOD SUGAR 2 TIMES A DAY 100 strip 3   oxyCODONE (ROXICODONE) 15 MG immediate release tablet Take 1 tablet by mouth four times a day (Patient taking differently: Take 15 mg by mouth every 8 (eight) hours as needed for pain.) 120 tablet 0   potassium chloride (KLOR-CON) 10 MEQ tablet Take 2 tablets (20 mEq total) by mouth every morning. 180 tablet 1   pregabalin (LYRICA) 150 MG capsule Take 1 capsule (150 mg total) by mouth 3 (three) times daily. 90 capsule 2   promethazine (PHENERGAN) 25 MG tablet Take 1 tablet (25 mg total) by mouth  at bedtime. 90 tablet 1   tiZANidine (ZANAFLEX) 4 MG tablet Take 2 tablets (8 mg total) by mouth 3 (three) times daily. 540 tablet 2   vitamin B-12 (CYANOCOBALAMIN) 500 MCG tablet Take 500 mcg by mouth daily.      vitamin E 200 UNIT capsule Take 200 Units by mouth daily.     No current facility-administered medications for this visit.    PHYSICAL EXAMINATION: ECOG PERFORMANCE STATUS: 1 - Symptomatic but completely ambulatory  Vitals:   01/06/24 1512 01/06/24 1513  BP: (!) 98/54 108/62  Pulse: 62   Resp: 18   Temp: 98.2 F (36.8 C)   SpO2: 98%    Filed Weights   01/06/24 1512  Weight: 217 lb (98.4 kg)    Physical Exam   BREAST: Tenderness in the breast and axillary area with palpable scar tissue.      (exam performed in the presence of a chaperone)  LABORATORY DATA:  I have reviewed the data as listed    Latest Ref Rng & Units 11/13/2023    3:30 PM 08/01/2023    3:22 AM 07/31/2023   11:54 AM  CMP  Glucose 70 - 99 mg/dL 161  096  045   BUN 8 - 23 mg/dL 21  20  18    Creatinine 0.44 - 1.00 mg/dL 4.09  8.11  9.14   Sodium 135 - 145 mmol/L 144  141  141   Potassium 3.5 - 5.1 mmol/L 3.7  3.7  3.5   Chloride 98 - 111  mmol/L 103  106  102   CO2 22 - 32 mmol/L 22  23  24    Calcium 8.9 - 10.3 mg/dL 78.2  8.9  9.5   Total Protein 6.5 - 8.1 g/dL 7.5   7.7   Total Bilirubin <1.2 mg/dL 0.4   0.7   Alkaline Phos 38 - 126 U/L 67   70   AST 15 - 41 U/L 31   26   ALT 0 - 44 U/L 25   27     Lab Results  Component Value Date   WBC 8.9 11/13/2023   HGB 13.4 11/13/2023   HCT 42.0 11/13/2023   MCV 89.6 11/13/2023   PLT 359 11/13/2023   NEUTROABS 6.4 11/13/2023    ASSESSMENT & PLAN:  Malignant neoplasm of upper-outer quadrant of left breast in female, estrogen receptor negative (HCC) 09/07/2019:Routine screening mammogram detected a 2.1cm mass in the left breast and no left axillary adenopathy. 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   Recommendations: 1.  Neoadjuvant chemotherapy with Taxotere/Cytoxan x 2 starting 09/24/2019 (discontinued because of respiratory failure after each chemotherapy) 2. Breast conserving surgery 12/09/2019: Microscopic foci of residual IDC grade 3, largest measured 2.5 mm foci of DCIS high-grade, resection margins negative, ER 0%, PR 0%, HER-2 negative, Ki-67 20% on the final path, 0/1 lymph node negative 3. Adjuvant radiation therapy 01/13/2020-02/08/2020 --------------------------------------------------------------------------------------------------------------------------------------------- 1.  Hospitalization 09/30/2019-10/04/2019: Diastolic CHF and asthma exacerbation with hypoxia (prednisone taper and Lasix) 2.  Hospitalization 10/30/2019-11/03/2019: Respiratory failure   Breast cancer surveillance: 1.  01/06/2024 breast exam: Benign 2. 08/23/2023: Mammograms: Benign breast density category C   Return to clinic in 1 year for follow-up      No orders of the defined types were placed in this encounter.  The patient has a good understanding of the overall plan. she agrees with it. she will call with any problems that may develop before the next  visit  here. Total time spent: 30 mins including face to face time and time spent for planning, charting and co-ordination of care   Tamsen Meek, MD 01/06/24

## 2024-01-06 NOTE — Assessment & Plan Note (Signed)
09/07/2019:Routine screening mammogram detected a 2.1cm mass in the left breast and no left axillary adenopathy. 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   Recommendations: 1.  Neoadjuvant chemotherapy with Taxotere/Cytoxan x 2 starting 09/24/2019 (discontinued because of respiratory failure after each chemotherapy) 2. Breast conserving surgery 12/09/2019: Microscopic foci of residual IDC grade 3, largest measured 2.5 mm foci of DCIS high-grade, resection margins negative, ER 0%, PR 0%, HER-2 negative, Ki-67 20% on the final path, 0/1 lymph node negative 3. Adjuvant radiation therapy 01/13/2020-02/08/2020 --------------------------------------------------------------------------------------------------------------------------------------------- 1.  Hospitalization 09/30/2019-10/04/2019: Diastolic CHF and asthma exacerbation with hypoxia (prednisone taper and Lasix) 2.  Hospitalization 10/30/2019-11/03/2019: Respiratory failure   Breast cancer surveillance: 1.  01/06/2024 breast exam: Benign 2. 08/23/2023: Mammograms: Benign breast density category C   Return to clinic in 1 year for follow-up

## 2024-01-07 ENCOUNTER — Other Ambulatory Visit: Payer: Self-pay | Admitting: Family Medicine

## 2024-01-07 ENCOUNTER — Other Ambulatory Visit (HOSPITAL_COMMUNITY): Payer: Self-pay

## 2024-01-07 ENCOUNTER — Other Ambulatory Visit (HOSPITAL_BASED_OUTPATIENT_CLINIC_OR_DEPARTMENT_OTHER): Payer: Self-pay

## 2024-01-07 ENCOUNTER — Other Ambulatory Visit: Payer: Self-pay

## 2024-01-07 MED FILL — Metformin HCl Tab 500 MG: ORAL | 30 days supply | Qty: 60 | Fill #0 | Status: CN

## 2024-01-08 ENCOUNTER — Other Ambulatory Visit (HOSPITAL_BASED_OUTPATIENT_CLINIC_OR_DEPARTMENT_OTHER): Payer: Self-pay

## 2024-01-08 ENCOUNTER — Other Ambulatory Visit (HOSPITAL_COMMUNITY): Payer: Self-pay

## 2024-01-08 MED ORDER — DOXEPIN HCL 50 MG PO CAPS
50.0000 mg | ORAL_CAPSULE | Freq: Every day | ORAL | 1 refills | Status: DC
  Filled 2024-01-08 – 2024-01-16 (×3): qty 90, 90d supply, fill #0
  Filled 2024-03-23: qty 90, 90d supply, fill #1
  Filled 2024-04-02: qty 30, 30d supply, fill #1
  Filled 2024-04-20 – 2024-04-28 (×2): qty 30, 30d supply, fill #2
  Filled 2024-05-13 – 2024-06-04 (×2): qty 30, 30d supply, fill #3

## 2024-01-09 ENCOUNTER — Other Ambulatory Visit (HOSPITAL_COMMUNITY): Payer: Self-pay

## 2024-01-14 ENCOUNTER — Other Ambulatory Visit: Payer: Self-pay

## 2024-01-14 ENCOUNTER — Other Ambulatory Visit (HOSPITAL_COMMUNITY): Payer: Self-pay

## 2024-01-14 MED FILL — Metformin HCl Tab 500 MG: ORAL | 30 days supply | Qty: 60 | Fill #0 | Status: AC

## 2024-01-15 ENCOUNTER — Other Ambulatory Visit: Payer: Self-pay

## 2024-01-16 ENCOUNTER — Other Ambulatory Visit (HOSPITAL_COMMUNITY): Payer: Self-pay

## 2024-01-16 ENCOUNTER — Other Ambulatory Visit: Payer: Self-pay | Admitting: Family Medicine

## 2024-01-16 ENCOUNTER — Other Ambulatory Visit: Payer: Self-pay

## 2024-01-16 DIAGNOSIS — E119 Type 2 diabetes mellitus without complications: Secondary | ICD-10-CM

## 2024-01-16 MED FILL — Atorvastatin Calcium Tab 80 MG (Base Equivalent): ORAL | 30 days supply | Qty: 30 | Fill #0 | Status: AC

## 2024-01-16 MED FILL — Isosorbide Mononitrate Tab ER 24HR 60 MG: ORAL | 30 days supply | Qty: 30 | Fill #0 | Status: AC

## 2024-01-16 NOTE — Telephone Encounter (Unsigned)
Copied from CRM [id number]. Topic: Clinical - Prescription Issue >> Jan 16, 2024  4:35 PM Shelby Anderson wrote: Reason for CRM: Patient has an appointment scheduled for March and she states that she is out of medication for Glipizide, isosorbide mononitrate (IMDUR) 60 MG, and atorvastatin (LIPITOR) 80 MG tablet and wants to know if her prescriptions can still be called in since appointment is so far out.

## 2024-01-17 ENCOUNTER — Other Ambulatory Visit: Payer: Self-pay

## 2024-01-17 ENCOUNTER — Other Ambulatory Visit (HOSPITAL_COMMUNITY): Payer: Self-pay

## 2024-01-17 MED FILL — Glipizide Tab ER 24HR 5 MG: ORAL | 90 days supply | Qty: 90 | Fill #0 | Status: AC

## 2024-01-22 NOTE — Patient Outreach (Signed)
  Care Management   Visit Note   Name: [name] MRN: [id number] DOB: 08/28/1956  Subjective: Shelby Anderson is a 68 y.o. year old female who is a primary care patient of Karie Georges, MD. The Care Management team was consulted for assistance.        Brief call with Ms. Coley regarding care management needs. Reports doing well today. Unable to complete outreach today but notes blood sugars and blood pressures have been stable. Denies urgent medication or care management needs Advised to continue monitoring blood sugars and blood pressures to review during scheduled outreach.  Appointment with Nurse Manager scheduled for 02/13/24. Agreed to call or contact clinic if she requires assistance prior to scheduled outreach.   PLAN Will follow up on 02/13/24     Juanell Fairly [facility] Population [facility] RN Care Manager Direct Dial: [phone]  Fax: [phone] Website: [email or web address]

## 2024-01-23 ENCOUNTER — Other Ambulatory Visit (HOSPITAL_COMMUNITY): Payer: Self-pay

## 2024-01-27 ENCOUNTER — Other Ambulatory Visit (HOSPITAL_COMMUNITY): Payer: Self-pay

## 2024-01-28 ENCOUNTER — Other Ambulatory Visit: Payer: Self-pay | Admitting: Family Medicine

## 2024-01-28 ENCOUNTER — Other Ambulatory Visit (HOSPITAL_COMMUNITY): Payer: Self-pay

## 2024-01-28 ENCOUNTER — Other Ambulatory Visit: Payer: Self-pay

## 2024-01-28 MED ORDER — BUPROPION HCL ER (XL) 150 MG PO TB24
150.0000 mg | ORAL_TABLET | Freq: Every day | ORAL | 2 refills | Status: DC
  Filled 2024-02-11: qty 30, 30d supply, fill #0
  Filled 2024-02-17 – 2024-03-10 (×3): qty 30, 30d supply, fill #1
  Filled 2024-03-23 – 2024-04-02 (×2): qty 30, 30d supply, fill #2

## 2024-01-28 MED ORDER — LISINOPRIL 5 MG PO TABS
5.0000 mg | ORAL_TABLET | Freq: Every morning | ORAL | 2 refills | Status: DC
  Filled 2024-02-11: qty 30, 30d supply, fill #0
  Filled 2024-02-17 – 2024-03-10 (×3): qty 30, 30d supply, fill #1
  Filled 2024-03-23 – 2024-04-02 (×2): qty 30, 30d supply, fill #2

## 2024-01-28 MED FILL — Isosorbide Mononitrate Tab ER 24HR 60 MG: ORAL | 30 days supply | Qty: 30 | Fill #1 | Status: CN

## 2024-01-28 MED FILL — Atorvastatin Calcium Tab 80 MG (Base Equivalent): ORAL | 30 days supply | Qty: 30 | Fill #1 | Status: CN

## 2024-01-28 MED FILL — Metformin HCl Tab 500 MG: ORAL | 30 days supply | Qty: 60 | Fill #1 | Status: CN

## 2024-01-29 ENCOUNTER — Other Ambulatory Visit: Payer: Self-pay

## 2024-01-29 ENCOUNTER — Other Ambulatory Visit (HOSPITAL_COMMUNITY): Payer: Self-pay

## 2024-02-02 ENCOUNTER — Other Ambulatory Visit: Payer: Self-pay | Admitting: Internal Medicine

## 2024-02-03 NOTE — Telephone Encounter (Signed)
 PA needed for Nexium

## 2024-02-04 DIAGNOSIS — G894 Chronic pain syndrome: Secondary | ICD-10-CM | POA: Diagnosis not present

## 2024-02-04 DIAGNOSIS — Z79891 Long term (current) use of opiate analgesic: Secondary | ICD-10-CM | POA: Diagnosis not present

## 2024-02-04 DIAGNOSIS — G4700 Insomnia, unspecified: Secondary | ICD-10-CM | POA: Diagnosis not present

## 2024-02-04 DIAGNOSIS — M47816 Spondylosis without myelopathy or radiculopathy, lumbar region: Secondary | ICD-10-CM | POA: Diagnosis not present

## 2024-02-06 ENCOUNTER — Encounter: Payer: Self-pay | Admitting: Family Medicine

## 2024-02-06 ENCOUNTER — Ambulatory Visit: Payer: Medicare Other | Admitting: Family Medicine

## 2024-02-06 VITALS — BP 98/58 | HR 75 | Temp 98.3°F | Ht 66.0 in | Wt 220.4 lb

## 2024-02-06 DIAGNOSIS — E039 Hypothyroidism, unspecified: Secondary | ICD-10-CM | POA: Diagnosis not present

## 2024-02-06 DIAGNOSIS — F331 Major depressive disorder, recurrent, moderate: Secondary | ICD-10-CM | POA: Diagnosis not present

## 2024-02-06 DIAGNOSIS — I10 Essential (primary) hypertension: Secondary | ICD-10-CM | POA: Diagnosis not present

## 2024-02-06 DIAGNOSIS — E119 Type 2 diabetes mellitus without complications: Secondary | ICD-10-CM

## 2024-02-06 LAB — POCT GLYCOSYLATED HEMOGLOBIN (HGB A1C): Hemoglobin A1C: 7 % — AB (ref 4.0–5.6)

## 2024-02-06 MED ORDER — DULOXETINE HCL 30 MG PO CPEP: 30.0000 mg | ORAL_CAPSULE | Freq: Every day | ORAL | 3 refills | Status: DC

## 2024-02-06 NOTE — Patient Instructions (Signed)
 Call Dr Casimiro Needle in 6 weeks to let me know how the Cymbalta is working for you. Then we may consider increasing to 60 mg daily.   Can buy L-methylfolate over the counter plus a B complex vitamin once daily.

## 2024-02-06 NOTE — Progress Notes (Signed)
 Established Patient Office Visit  Subjective   Patient ID: Shelby Anderson, female    DOB: 21-Feb-1956  Age: 68 y.o. MRN: [id number]  Chief Complaint  Patient presents with   Medical Management of Chronic Issues    Pt is here for 6 month follow up today. States that she is in her normal state of health, no new symptoms or issues today.   DM- A1C performed in office today for follow up. It is at her goal of 7.0, but it is slightly higher than previous results. She reports compliance with her medications, she is due for labs today. Health maintenance reviewed.   HTN - BP on the low side today, however she denies any dizziness or syncopal symptoms.   Depression-- pt reports worsening depression symptoms, PHQ score was reviewed. We discussed options for treatment, I rei      02/06/2024    4:18 PM 09/27/2023    3:47 PM 09/27/2023    3:46 PM 08/08/2023    4:13 PM 02/05/2023    4:21 PM  Depression screen PHQ 2/9  Decreased Interest 3 1 0 1 1  Down, Depressed, Hopeless 3 1 0 1 1  PHQ - 2 Score 6 2 0 2 2  Altered sleeping 1 1 0 0 1  Tired, decreased energy 3 1 0 3 1  Change in appetite 1 1   2   Feeling bad or failure about yourself  0 0 0 0 0  Trouble concentrating 0 1 0 1 0  Moving slowly or fidgety/restless 0 0 0 0 0  Suicidal thoughts 0 0 0 0 0  PHQ-9 Score 11 6 0 6 6  Difficult doing work/chores  Not difficult at all   Somewhat difficult     Current Outpatient Medications  Medication Instructions   albuterol (PROVENTIL) (2.5 MG/3ML) 0.083% nebulizer solution Inhale 3 mLs (2.5 mg total) by nebulization every 6 (six) hours as needed for wheezing or shortness of breath (J45.40).   albuterol (VENTOLIN HFA) 108 (90 Base) MCG/ACT inhaler 1-2 puffs, Inhalation, Every 4 hours PRN   aspirin EC 81 mg, Daily at bedtime   atorvastatin (LIPITOR) 80 mg, Oral, Daily at bedtime   Blood Glucose Monitoring Suppl (ONE TOUCH ULTRA 2) w/Device KIT Use as directed   budesonide-formoterol  (SYMBICORT) 80-4.5 MCG/ACT inhaler Inhale 2 puffs into the lungs every morning and another 2 puffs 12 hours later.   buPROPion (WELLBUTRIN XL) 150 mg, Oral, Daily   Calcium Carbonate-Vitamin D 600-200 MG-UNIT TABS 1 tablet, Daily   CINNAMON PO 1,000 mg, 2 times daily   dapagliflozin propanediol (FARXIGA) 10 mg, Oral, Daily before breakfast   doxepin (SINEQUAN) 50 mg, Oral, Daily at bedtime   DULoxetine (CYMBALTA) 30 mg, Oral, Daily   esomeprazole (NEXIUM) 40 mg, Oral, 2 times daily, Office visit for further refills   famotidine (PEPCID) 20 mg, Oral, 2 times daily   fluticasone (FLONASE) 50 MCG/ACT nasal spray 1 spray, Nightly   furosemide (LASIX) 20 MG tablet Take 1 tablet (20 mg total) by mouth daily as needed for ankle swelling.   glipiZIDE (GLUCOTROL XL) 5 MG 24 hr tablet Take 1 tablet (5 mg total) by mouth in the morning.   glucose blood (ONETOUCH ULTRA) test strip 1 each, Other, 2 times daily   hydrALAZINE (APRESOLINE) 25 MG tablet Take 1 tablet (25 mg total) by mouth daily as needed for blood pressure >140/48mmhg, up to three times a day   isosorbide mononitrate (IMDUR) 60 MG 24 hr  tablet Take 1 tablet (60 mg total) by mouth every morning.   Lancets (ONETOUCH DELICA PLUS LANCET30G) MISC USE TO CHECK BLOOD SUGAR 2 TIMES A DAY   levothyroxine (SYNTHROID) 75 mcg, Oral, Daily before breakfast   lisinopril (ZESTRIL) 5 mg, Oral, Every morning   Lyrica 150 mg, 3 times daily   metFORMIN (GLUCOPHAGE) 500 mg, Oral, 2 times daily with meals   metoprolol succinate (TOPROL-XL) 25 mg, Oral, Daily, TAKE WITH OR IMMEDIATELY FOLLOWING A MEAL.   Misc Natural Products (GLUCOS-CHONDROIT-MSM COMPLEX PO) 2 tablets, Daily   mometasone (ELOCON) 0.1 % cream 1 Application, Daily PRN   montelukast (SINGULAIR) 10 mg, Oral, Nightly   Multiple Vitamin (MULITIVITAMIN WITH MINERALS) TABS 1 tablet, Daily   nitroGLYCERIN (NITROSTAT) 0.4 MG SL tablet Place 1 tablet under the tongue every 5 minutes as needed for chest  pain up to 3 times. If no relief, call 911.   NURTEC 75 MG TBDP 1 each, Oral, Daily PRN   ondansetron (ZOFRAN) 4 mg, Oral, Every 8 hours PRN   ONETOUCH ULTRA test strip USE TO CHECK BLOOD SUGAR 2 TIMES A DAY   oxyCODONE (ROXICODONE) 15 MG immediate release tablet Take 1 tablet by mouth four times a day   potassium chloride (KLOR-CON) 10 MEQ tablet 20 mEq, Oral, Every morning   pregabalin (LYRICA) 150 mg, Oral, 3 times daily   promethazine (PHENERGAN) 25 mg, Oral, Daily at bedtime   tiZANidine (ZANAFLEX) 8 mg, Oral, 3 times daily   vitamin B-12 (CYANOCOBALAMIN) 500 mcg, Daily   Vitamin D3 2,000 Units, Daily   vitamin E 200 Units, Daily    Patient Active Problem List   Diagnosis Date Noted   Moderate episode of recurrent major depressive disorder (HCC) 02/10/2024   UTI (urinary tract infection) 08/01/2023   Nausea and vomiting 07/31/2023   Acute cystitis 07/31/2023   Hypothyroidism 09/11/2022   Dental caries 06/12/2022   Spondylosis without myelopathy or radiculopathy, cervical region 04/21/2021   Neutropenia with fever (HCC) 10/01/2019   Genetic testing 09/17/2019   Family history of melanoma    Family history of pancreatic cancer    Malignant neoplasm of upper-outer quadrant of left breast in female, estrogen receptor negative (HCC) 09/07/2019   Encounter for loop recorder at end of battery life 04/24/2018   Esophageal stricture 07/01/2017   Hyperlipidemia associated with type 2 diabetes mellitus (HCC) 05/20/2017   Allergic rhinitis 03/11/2017   Dilated cardiomyopathy (HCC) 02/07/2017   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 11/13/2016   Morbid obesity due to excess calories (HCC) 09/26/2016   Increased endometrial stripe thickness 06/27/2016   Intramural leiomyoma of uterus 06/27/2016   Type 2 diabetes mellitus without complication, without long-term current use of insulin (HCC) 12/06/2015   HTN (hypertension), benign 07/19/2015   Arrhythmia 07/19/2015   Orthostatic  hypotension 07/19/2015   GERD (gastroesophageal reflux disease) 07/19/2015   Chronic back pain 07/19/2015   Neuropathy 07/19/2015   Symptomatic PVCs 11/02/2014   Syncope 11/02/2014   Status post placement of implantable loop recorder 11/02/2014   Stenosis of cervix 01/07/2014   Chest pain 12/23/2013   Disorder of cervix 03/10/2013   Vaginal atrophy 03/10/2013   OSA (obstructive sleep apnea) 07/31/2012   Asthma 04/29/2012   Coronary artery disease 11/20/2011      Review of Systems  All other systems reviewed and are negative.     Objective:     BP (!) 98/58   Pulse 75   Temp 98.3 F (36.8 C) (Oral)  Ht 5\' 6"  (1.676 m)   Wt 220 lb 6.4 oz (100 kg)   SpO2 97%   BMI 35.57 kg/m    Physical Exam Vitals reviewed.  Constitutional:      Appearance: Normal appearance. She is obese.  Eyes:     Conjunctiva/sclera: Conjunctivae normal.  Cardiovascular:     Rate and Rhythm: Normal rate and regular rhythm.     Pulses: Normal pulses.     Heart sounds: Normal heart sounds. No murmur heard. Pulmonary:     Effort: Pulmonary effort is normal.     Breath sounds: Normal breath sounds. No wheezing.  Neurological:     Mental Status: She is alert and oriented to person, place, and time. Mental status is at baseline.  Psychiatric:        Mood and Affect: Mood normal.        Behavior: Behavior normal.      Results for orders placed or performed in visit on 02/06/24  POC HgB A1c  Result Value Ref Range   Hemoglobin A1C 7.0 (A) 4.0 - 5.6 %   HbA1c POC (<> result, manual entry)     HbA1c, POC (prediabetic range)     HbA1c, POC (controlled diabetic range)        The ASCVD Risk score (Arnett DK, et al., 2019) failed to calculate for the following reasons:   The valid total cholesterol range is 130 to 320 mg/dL    Assessment & Plan:  Type 2 diabetes mellitus without complication, without long-term current use of insulin (HCC) Assessment & Plan: A1C today is slightly higher  than previous but still controlled at 7.0. I have ordered her lipid panel and microalbumin testing. RTC in 6 months.   Orders: -     POCT glycosylated hemoglobin (Hb A1C) -     Collection capillary blood specimen -     Lipid panel; Future -     Microalbumin / creatinine urine ratio; Future  Hypothyroidism, unspecified type Assessment & Plan: Patient is currently on levothyroxine 75 mcg daily, last TSH was reviewed, needs new TSH check today  Orders: -     TSH; Future  HTN (hypertension), benign Assessment & Plan: Current hypertension medications:       Sig   furosemide (LASIX) 20 MG tablet (Taking As Needed) Take 1 tablet (20 mg total) by mouth daily as needed for ankle swelling.   hydrALAZINE (APRESOLINE) 25 MG tablet (Taking As Needed) Take 1 tablet (25 mg total) by mouth daily as needed for blood pressure >140/23mmhg, up to three times a day   lisinopril (ZESTRIL) 5 MG tablet (Taking) Take 1 tablet (5 mg total) by mouth every morning.   metoprolol succinate (TOPROL-XL) 25 MG 24 hr tablet (Taking) Take 1 tablet (25 mg total) by mouth daily. TAKE WITH OR IMMEDIATELY FOLLOWING A MEAL.      BP performed today in office, well controlled, will continue the above listed medications as prescribed.   Moderate episode of recurrent major depressive disorder [facility]) Assessment & Plan: New problem. We discussed medications that could help, risks and benefits of the medication were discussed, will start cymbalta 30 mg daily, this may also help her with her neuropathy pain. She will let me know how the medication is working for her.   Orders: -     DULoxetine HCl; Take 1 capsule (30 mg total) by mouth daily.  Dispense: 30 capsule; Refill: 3     Return in about 6 months (around 08/08/2024).  Karie Georges, MD

## 2024-02-07 ENCOUNTER — Other Ambulatory Visit

## 2024-02-07 DIAGNOSIS — E039 Hypothyroidism, unspecified: Secondary | ICD-10-CM

## 2024-02-07 DIAGNOSIS — E119 Type 2 diabetes mellitus without complications: Secondary | ICD-10-CM | POA: Diagnosis not present

## 2024-02-07 LAB — LIPID PANEL
Cholesterol: 118 mg/dL (ref 0–200)
HDL: 40.9 mg/dL (ref >39.00)
LDL Cholesterol: 57 mg/dL (ref 0–99)
NonHDL: 76.98
Total CHOL/HDL Ratio: 3
Triglycerides: 102 mg/dL (ref 0.0–149.0)
VLDL: 20.4 mg/dL (ref 0.0–40.0)

## 2024-02-07 LAB — MICROALBUMIN / CREATININE URINE RATIO
Creatinine,U: 105.6 mg/dL
Microalb Creat Ratio: 14.1 mg/g (ref 0.0–30.0)
Microalb, Ur: 1.5 mg/dL (ref 0.0–1.9)

## 2024-02-07 LAB — TSH: TSH: 2.01 u[IU]/mL (ref 0.35–5.50)

## 2024-02-10 ENCOUNTER — Encounter: Payer: Self-pay | Admitting: Family Medicine

## 2024-02-10 DIAGNOSIS — F331 Major depressive disorder, recurrent, moderate: Secondary | ICD-10-CM | POA: Insufficient documentation

## 2024-02-10 NOTE — Assessment & Plan Note (Signed)
 Patient is currently on levothyroxine 75 mcg daily, last TSH was reviewed, needs new TSH check today

## 2024-02-10 NOTE — Assessment & Plan Note (Signed)
 Current hypertension medications:       Sig   furosemide (LASIX) 20 MG tablet (Taking As Needed) Take 1 tablet (20 mg total) by mouth daily as needed for ankle swelling.   hydrALAZINE (APRESOLINE) 25 MG tablet (Taking As Needed) Take 1 tablet (25 mg total) by mouth daily as needed for blood pressure >140/33mmhg, up to three times a day   lisinopril (ZESTRIL) 5 MG tablet (Taking) Take 1 tablet (5 mg total) by mouth every morning.   metoprolol succinate (TOPROL-XL) 25 MG 24 hr tablet (Taking) Take 1 tablet (25 mg total) by mouth daily. TAKE WITH OR IMMEDIATELY FOLLOWING A MEAL.      BP performed today in office, well controlled, will continue the above listed medications as prescribed.

## 2024-02-10 NOTE — Assessment & Plan Note (Signed)
 New problem. We discussed medications that could help, risks and benefits of the medication were discussed, will start cymbalta 30 mg daily, this may also help her with her neuropathy pain. She will let me know how the medication is working for her.

## 2024-02-10 NOTE — Assessment & Plan Note (Signed)
 A1C today is slightly higher than previous but still controlled at 7.0. I have ordered her lipid panel and microalbumin testing. RTC in 6 months.

## 2024-02-11 ENCOUNTER — Other Ambulatory Visit: Payer: Self-pay

## 2024-02-11 ENCOUNTER — Other Ambulatory Visit: Payer: Self-pay | Admitting: Family Medicine

## 2024-02-11 ENCOUNTER — Other Ambulatory Visit (HOSPITAL_COMMUNITY): Payer: Self-pay

## 2024-02-11 DIAGNOSIS — R600 Localized edema: Secondary | ICD-10-CM

## 2024-02-11 MED ORDER — FUROSEMIDE 20 MG PO TABS
20.0000 mg | ORAL_TABLET | Freq: Every day | ORAL | 1 refills | Status: AC | PRN
  Filled 2024-02-11: qty 30, 30d supply, fill #0
  Filled 2024-06-07: qty 90, 90d supply, fill #0

## 2024-02-11 MED ORDER — ISOSORBIDE MONONITRATE ER 60 MG PO TB24
60.0000 mg | ORAL_TABLET | Freq: Every morning | ORAL | 5 refills | Status: DC
  Filled 2024-02-11: qty 30, 30d supply, fill #0
  Filled 2024-02-17 – 2024-03-10 (×3): qty 30, 30d supply, fill #1
  Filled 2024-03-23 – 2024-04-02 (×2): qty 30, 30d supply, fill #2
  Filled 2024-04-20 – 2024-04-28 (×2): qty 30, 30d supply, fill #3
  Filled 2024-05-13 – 2024-06-04 (×2): qty 30, 30d supply, fill #4
  Filled 2024-07-07: qty 30, 30d supply, fill #5

## 2024-02-11 MED FILL — Atorvastatin Calcium Tab 80 MG (Base Equivalent): ORAL | 30 days supply | Qty: 30 | Fill #1 | Status: AC

## 2024-02-11 MED FILL — Metformin HCl Tab 500 MG: ORAL | 30 days supply | Qty: 60 | Fill #1 | Status: AC

## 2024-02-11 MED FILL — Isosorbide Mononitrate Tab ER 24HR 60 MG: ORAL | 30 days supply | Qty: 30 | Fill #1 | Status: CN

## 2024-02-12 ENCOUNTER — Other Ambulatory Visit: Payer: Self-pay

## 2024-02-12 ENCOUNTER — Other Ambulatory Visit (HOSPITAL_COMMUNITY): Payer: Self-pay

## 2024-02-13 ENCOUNTER — Telehealth: Payer: Self-pay | Admitting: Internal Medicine

## 2024-02-13 ENCOUNTER — Other Ambulatory Visit: Payer: Self-pay

## 2024-02-13 ENCOUNTER — Other Ambulatory Visit (HOSPITAL_COMMUNITY): Payer: Self-pay

## 2024-02-13 ENCOUNTER — Telehealth: Payer: Self-pay

## 2024-02-13 NOTE — Telephone Encounter (Signed)
 Patient is calling to request a refill on her esomeprazole until she is able to be seen. Please advise.

## 2024-02-13 NOTE — Patient Outreach (Signed)
  Care Management   Outreach Note  02/13/2024 Name: Shelby Anderson MRN: [id number] DOB: June 14, 1956  An unsuccessful outreach attempt was made today for a scheduled Care Management visit.    Follow Up Plan:  A HIPAA compliant phone message was left for the patient providing contact information and requesting a return call.     Juanell Fairly [facility] Population Health RN Care Manager Direct Dial: [phone]  Fax: [phone] Website: [email or web address]

## 2024-02-14 ENCOUNTER — Other Ambulatory Visit (HOSPITAL_COMMUNITY): Payer: Self-pay

## 2024-02-14 ENCOUNTER — Other Ambulatory Visit: Payer: Self-pay

## 2024-02-14 MED ORDER — ESOMEPRAZOLE MAGNESIUM 40 MG PO CPDR
40.0000 mg | DELAYED_RELEASE_CAPSULE | Freq: Two times a day (BID) | ORAL | 1 refills | Status: DC
  Filled 2024-02-14: qty 180, 90d supply, fill #0

## 2024-02-14 NOTE — Telephone Encounter (Signed)
 Nexium refilled

## 2024-02-17 ENCOUNTER — Other Ambulatory Visit (HOSPITAL_COMMUNITY): Payer: Self-pay

## 2024-02-17 ENCOUNTER — Other Ambulatory Visit: Payer: Self-pay

## 2024-02-17 ENCOUNTER — Other Ambulatory Visit: Payer: Self-pay | Admitting: Internal Medicine

## 2024-02-17 ENCOUNTER — Other Ambulatory Visit: Payer: Self-pay | Admitting: Family Medicine

## 2024-02-17 ENCOUNTER — Ambulatory Visit: Payer: Medicare Other | Admitting: Podiatry

## 2024-02-17 MED ORDER — PROMETHAZINE HCL 25 MG PO TABS
25.0000 mg | ORAL_TABLET | Freq: Every day | ORAL | 1 refills | Status: DC
  Filled 2024-03-05 – 2024-03-10 (×2): qty 30, 30d supply, fill #0
  Filled 2024-03-23 – 2024-04-02 (×2): qty 30, 30d supply, fill #1
  Filled 2024-04-20 – 2024-04-28 (×2): qty 30, 30d supply, fill #2
  Filled 2024-05-13 – 2024-06-04 (×2): qty 30, 30d supply, fill #3
  Filled 2024-07-07: qty 30, 30d supply, fill #4
  Filled 2024-08-04: qty 30, 30d supply, fill #5

## 2024-02-17 MED ORDER — METFORMIN HCL 500 MG PO TABS
500.0000 mg | ORAL_TABLET | Freq: Two times a day (BID) | ORAL | 5 refills | Status: DC
  Filled 2024-03-05 – 2024-03-10 (×2): qty 60, 30d supply, fill #0
  Filled 2024-03-23 – 2024-04-02 (×2): qty 60, 30d supply, fill #1
  Filled 2024-04-20 – 2024-04-28 (×2): qty 60, 30d supply, fill #2
  Filled 2024-05-13 – 2024-06-04 (×2): qty 60, 30d supply, fill #3
  Filled 2024-07-07: qty 60, 30d supply, fill #4
  Filled 2024-08-04: qty 60, 30d supply, fill #5

## 2024-02-17 MED ORDER — FAMOTIDINE 20 MG PO TABS
20.0000 mg | ORAL_TABLET | Freq: Two times a day (BID) | ORAL | 1 refills | Status: DC
  Filled 2024-03-05 – 2024-03-10 (×2): qty 60, 30d supply, fill #0
  Filled 2024-03-23 – 2024-04-02 (×2): qty 60, 30d supply, fill #1

## 2024-02-17 MED FILL — Atorvastatin Calcium Tab 80 MG (Base Equivalent): ORAL | 30 days supply | Qty: 30 | Fill #2 | Status: CN

## 2024-02-18 ENCOUNTER — Telehealth: Payer: Self-pay

## 2024-02-18 ENCOUNTER — Other Ambulatory Visit (HOSPITAL_COMMUNITY): Payer: Self-pay

## 2024-02-18 NOTE — Telephone Encounter (Signed)
 Pharmacy Patient Advocate Encounter   Received notification from RX Request Messages that prior authorization for Esomeprazole Magnesium 40MG  dr capsules is required/requested.   Insurance verification completed.   The patient is insured through [facility] Medicare Part D .   Per test claim: Refill too soon. PA is not needed at this time. Medication was filled 02-14-2024. Next eligible fill date is 04-22-2024.

## 2024-02-18 NOTE — Telephone Encounter (Signed)
 PA not required at this time.

## 2024-02-19 ENCOUNTER — Telehealth: Payer: Self-pay

## 2024-02-19 ENCOUNTER — Other Ambulatory Visit (HOSPITAL_COMMUNITY): Payer: Self-pay

## 2024-02-19 ENCOUNTER — Ambulatory Visit: Payer: Medicare Other

## 2024-02-19 MED ORDER — NEXIUM 40 MG PO CPDR
40.0000 mg | DELAYED_RELEASE_CAPSULE | Freq: Two times a day (BID) | ORAL | 0 refills | Status: DC
  Filled 2024-02-19: qty 180, 90d supply, fill #0

## 2024-02-19 NOTE — Telephone Encounter (Signed)
 Nexium refilled - should be name brand

## 2024-02-19 NOTE — Telephone Encounter (Signed)
 LM for patient that ppw has expired. Patient can still come in and try on shoes but will not be dispensed if ppw is not received by time of visit Ppw faxed to Dr Galen Daft this am  Addison Bailey CPed, CFo, CFm

## 2024-02-19 NOTE — Telephone Encounter (Signed)
 Inbound call from patient requesting for name brand Nexium be refilled at [address] instead on generic. Stated name brand works better for her. Please advise, thank you.

## 2024-02-19 NOTE — Progress Notes (Signed)
 Patient was here and fit with DM shoes and inserts all items fit well -however ppw has expired once received patient can pick up    Patient presents today to pick up diabetic shoes and insoles.  Patient tried on 1 pair of diabetic shoes and 3 pairs of foam casted diabetic insoles. Fit was satisfactory. Instructions for break-in and wear was reviewed and a copy was given to the patient.   Re-appointment for regularly scheduled diabetic foot care visits or if they should experience any trouble with the shoes or insoles.   I will add charges upon pick up

## 2024-02-24 ENCOUNTER — Other Ambulatory Visit (HOSPITAL_COMMUNITY): Payer: Self-pay

## 2024-02-24 ENCOUNTER — Other Ambulatory Visit: Payer: Self-pay

## 2024-02-24 MED ORDER — NEXIUM 40 MG PO CPDR: 40.0000 mg | DELAYED_RELEASE_CAPSULE | Freq: Every day | ORAL | 1 refills | Status: DC

## 2024-02-24 NOTE — Addendum Note (Signed)
 Addended by: Alonna Buckler K on: 02/24/2024 03:00 PM   Modules accepted: Orders

## 2024-02-24 NOTE — Telephone Encounter (Signed)
 Name brand Nexium sent to [facility]

## 2024-02-24 NOTE — Telephone Encounter (Signed)
 Patient called and stated that her pharmacy has still not received her Nexium and was wanting to know what they status of her Prior Authorization for the Nexium. Patient is requesting a call back.

## 2024-02-25 NOTE — Telephone Encounter (Signed)
 Lm on vm that Nexium was resent to correct pharamacy.  Does not appear to need a PA

## 2024-02-26 NOTE — Telephone Encounter (Signed)
 Patient called stating that [facility] had not received a refill request for Nexium. I advised patient that the refill was sent to [facility] pharmacy. Patient is [facility] [address] in [place] because it is cheaper. Please advise.

## 2024-02-27 ENCOUNTER — Other Ambulatory Visit (HOSPITAL_COMMUNITY): Payer: Self-pay

## 2024-02-27 ENCOUNTER — Telehealth: Payer: Self-pay

## 2024-02-27 NOTE — Telephone Encounter (Signed)
 Pharmacy Patient Advocate Encounter  Received notification from [facility] Medicare Part D that Prior Authorization for NEXIUM 40 MG delayed-release capsules has been DENIED.  Full denial letter will be uploaded to the media tab. See denial reason below.  Nexium capsule is denied because it is not on your plan's Drug List (formulary). Medication authorization requires the following:  (1) You need to try three (3) of these covered drugs:  (A) Dexlansoprazole (B) Lansoprazole (C) Omeprazole (D) Rabeprazole (2) OR your doctor needs to give [place] specific medical reasons why three (3) of the covered drug(s) are not appropriate for you.  PA #/Case ID/Reference #: [id number]

## 2024-02-27 NOTE — Telephone Encounter (Signed)
 This patient has been on brand name Nexium for years and each year we have to do a PA of some kind to get the price down.  Please initiate this - she cannot take generic.  Thanks!

## 2024-02-27 NOTE — Telephone Encounter (Signed)
 PA request has been Submitted. New Encounter has been or will be created for follow up. For additional info see Pharmacy Prior Auth telephone encounter from 02-27-2024.

## 2024-02-27 NOTE — Telephone Encounter (Signed)
 Pharmacy Patient Advocate Encounter   Received notification from Pt Calls Messages that prior authorization for NEXIUM 40 MG delayed-release capsules is required/requested.   Insurance verification completed.   The patient is insured through [facility] Medicare Part D .   Per test claim: PA required; PA submitted to above mentioned insurance via CoverMyMeds Key/confirmation #/EOC [facility] Status is pending

## 2024-02-28 ENCOUNTER — Other Ambulatory Visit (HOSPITAL_COMMUNITY): Payer: Self-pay

## 2024-03-02 ENCOUNTER — Ambulatory Visit: Admitting: Podiatry

## 2024-03-02 DIAGNOSIS — M79675 Pain in left toe(s): Secondary | ICD-10-CM

## 2024-03-02 DIAGNOSIS — B351 Tinea unguium: Secondary | ICD-10-CM | POA: Diagnosis not present

## 2024-03-02 DIAGNOSIS — M79674 Pain in right toe(s): Secondary | ICD-10-CM | POA: Diagnosis not present

## 2024-03-02 NOTE — Progress Notes (Unsigned)
 Subjective:  Patient ID: Shelby Anderson, female    DOB: Jul 19, 1956,  MRN: [id number]  Shelby Anderson presents to clinic today for:  Chief Complaint  Patient presents with   Nail Problem    Nail trim    Patient notes nails are thick and elongated, causing pain in shoe gear when ambulating.    PCP is Karie Georges, MD.   Past Medical History:  Diagnosis Date   Allergy    Anemia    "chronic"   Angina    Prinzmetal angina   Anxiety and depression    Arthritis    Asthma    Atrial fibrillation (HCC)    h/o "AF w/frequent PVCs"   Breast cancer (HCC)    Left   Cancer (HCC)    hx of skin cancer    CHF (congestive heart failure) (HCC)    Chronic back pain greater than 3 months duration    on chronic narcotics, treated at pain clinic   Chronic respiratory failure (HCC) 09/15/2015   ONO 09/04/15 + desats >begin O2 at 2l/ m    Colon polyps    hyperplastic   Coronary artery disease    Arrythmia, orthostatic hypotension, HLD, HTN; sees Dr. Jacinto Halim   Difficult intubation    "TMJ & woke up when they were still cutting on me"   Dysrhythmia    sees Dr. Jacinto Halim and a cardiologist at [facility]   Esophageal stricture    Family history of melanoma ]   Family history of pancreatic cancer    Fatty liver    Fatty liver    Fibroids    Fibromyalgia    "in my legs"   GERD (gastroesophageal reflux disease)    hx hiatal hernia, stricture and gastric ulcer   Headache(784.0)    migraines   Heart murmur    Hiatal hernia    History of loop recorder    History of migraines    "dx'd when I was in my teens"   Hyperlipemia    Hypertension    Mental disorder    Mild episode of recurrent major depressive disorder (HCC) 12/06/2015   Myocardial infarction [facility]) 1980's & 1990;   sees Dr. Jacinto Halim   OSA (obstructive sleep apnea)    Personal history of chemotherapy    Personal history of radiation therapy    Pneumonia    multiple times   PONV (postoperative nausea and  vomiting)    Recurrent upper respiratory infection (URI)    S/P left TKA 09/25/2016   Shortness of breath 11/20/11   "all the time", sees pulmonlogy, ? asthma   Stenotic cervical os    Stomach ulcer    "3 small; found in 05/2011"   TMJ (dislocation of temporomandibular joint)    Tuberculosis    + TB SKIN TEST   Type 2 diabetes mellitus without complication, without long-term current use of insulin (HCC) 12/06/2015   not on meds     Allergies  Allergen Reactions   Lodine [Etodolac] Anaphylaxis, Hives and Swelling   Oxycontin [Oxycodone Hcl] Anaphylaxis    hives, trouble breathing, tongue swelling (Only Oxycontin) Tolerates plain oxycodone.   Penicillins Anaphylaxis    Told by a surgeon never to take it again. Has patient had a PCN reaction causing immediate rash, facial/tongue/throat swelling, SOB or lightheadedness with hypotension: Yes Has patient had a PCN reaction causing severe rash involving mucus membranes or skin necrosis: Unknown Has patient had a PCN reaction  that required hospitalization: No Has patient had a PCN reaction occurring within the last 10 years: No If all of the above answers are "NO", then may proceed with Cephalosporin use.   Aspirin Other (See Comments)    High-dose caused GI Bleeds   Darvocet [Propoxyphene N-Acetaminophen] Hives   Nitroglycerin Other (See Comments)    IV-BP drops dramatically Can take po   Propoxyphene Hives   Tramadol Hives and Itching   Valium Other (See Comments)    Circulation problems. "Legs turned black".    Objective:  KORENA Anderson is a pleasant 68 y.o. female in NAD. AAO x 3.  Vascular Examination: Patient has palpable DP pulse, absent PT pulse bilateral.  Delayed capillary refill bilateral toes.  Sparse digital hair bilateral.  Proximal to distal cooling WNL bilateral.    Dermatological Examination: Interspaces are clear with no open lesions noted bilateral.  Skin is shiny and atrophic bilateral.  Nails are 3-21mm  thick, with yellowish/brown discoloration, subungual debris and distal onycholysis x8.  There is pain with compression of nails x8.       Latest Ref Rng & Units 02/06/2024    4:29 PM 08/08/2023    4:20 PM  Hemoglobin A1C  Hemoglobin-A1c 4.0 - 5.6 % 7.0  6.4    Patient qualifies for at-risk foot care because of Diabetes with PVD .  Assessment/Plan: 1. Pain due to onychomycosis of toenails of both feet     Mycotic nails x8 were sharply debrided with sterile nail nippers and power debriding burr to decrease bulk and length.  Return in about 3 months (around 06/01/2024) for RFC.   Clerance Lav, DPM, FACFAS [facility]     [address] [place], [place] [address]                Office [phone]  Fax [phone]

## 2024-03-03 NOTE — Telephone Encounter (Signed)
 Lm on vm

## 2024-03-04 NOTE — Telephone Encounter (Signed)
 PT returning call to discuss medication. Please advise.

## 2024-03-05 ENCOUNTER — Other Ambulatory Visit: Payer: Self-pay | Admitting: Internal Medicine

## 2024-03-05 ENCOUNTER — Other Ambulatory Visit (HOSPITAL_COMMUNITY): Payer: Self-pay

## 2024-03-05 ENCOUNTER — Other Ambulatory Visit: Payer: Self-pay

## 2024-03-05 MED FILL — Atorvastatin Calcium Tab 80 MG (Base Equivalent): ORAL | 30 days supply | Qty: 30 | Fill #2 | Status: CN

## 2024-03-06 ENCOUNTER — Other Ambulatory Visit (HOSPITAL_COMMUNITY): Payer: Self-pay

## 2024-03-06 ENCOUNTER — Other Ambulatory Visit: Payer: Self-pay

## 2024-03-06 NOTE — Telephone Encounter (Signed)
 Pharmacy Patient Advocate Encounter  Information has been sent to clinical pharmacist for appeals review. It may take 5-7 days to prepare the necessary documentation to request the appeal from the insurance.

## 2024-03-06 NOTE — Telephone Encounter (Signed)
 Lm on vm

## 2024-03-06 NOTE — Telephone Encounter (Signed)
 I spoke to the patient and she verbalized that she has already tried Prevacid, Omeprazole, and Aciphex - all of which are on the formulary.  Brand name Nexium, for which she gets an approved PA every year, is the only that is effective.

## 2024-03-09 ENCOUNTER — Telehealth: Payer: Self-pay | Admitting: Pharmacist

## 2024-03-09 NOTE — Telephone Encounter (Signed)
 Appeal has been submitted for branded Nexium. Will advise when response is received, please be advised that most companies may take 30 days to make a decision. Appeal letter and supporting documentation have been faxed to [phone] on 03/09/2024 @12 :19 pm.  Thank you, Dellie Burns, PharmD Clinical Pharmacist  [place]  Direct Dial: [phone]

## 2024-03-10 ENCOUNTER — Other Ambulatory Visit (HOSPITAL_COMMUNITY): Payer: Self-pay

## 2024-03-10 ENCOUNTER — Other Ambulatory Visit: Payer: Self-pay

## 2024-03-10 MED FILL — Atorvastatin Calcium Tab 80 MG (Base Equivalent): ORAL | 30 days supply | Qty: 30 | Fill #2 | Status: AC

## 2024-03-12 ENCOUNTER — Other Ambulatory Visit: Payer: Self-pay

## 2024-03-13 ENCOUNTER — Other Ambulatory Visit (HOSPITAL_COMMUNITY): Payer: Self-pay

## 2024-03-13 MED ORDER — NEXIUM 40 MG PO CPDR
40.0000 mg | DELAYED_RELEASE_CAPSULE | Freq: Two times a day (BID) | ORAL | 1 refills | Status: DC
  Filled 2024-03-13 – 2024-03-17 (×2): qty 60, 30d supply, fill #0

## 2024-03-13 NOTE — Addendum Note (Signed)
 Addended by: Jeanine Luz on: 03/13/2024 04:57 PM   Modules accepted: Orders

## 2024-03-13 NOTE — Telephone Encounter (Signed)
 Per insurance, the appeal for Nexium Brand name is approved through 12/02/2024

## 2024-03-13 NOTE — Telephone Encounter (Signed)
 Appeal for brand name Nexium approved;  resent rx to pharmacy

## 2024-03-14 ENCOUNTER — Other Ambulatory Visit (HOSPITAL_COMMUNITY): Payer: Self-pay

## 2024-03-16 ENCOUNTER — Other Ambulatory Visit: Payer: Self-pay

## 2024-03-17 ENCOUNTER — Other Ambulatory Visit (HOSPITAL_COMMUNITY): Payer: Self-pay

## 2024-03-17 ENCOUNTER — Other Ambulatory Visit: Payer: Self-pay

## 2024-03-17 DIAGNOSIS — G4700 Insomnia, unspecified: Secondary | ICD-10-CM | POA: Diagnosis not present

## 2024-03-17 DIAGNOSIS — M47816 Spondylosis without myelopathy or radiculopathy, lumbar region: Secondary | ICD-10-CM | POA: Diagnosis not present

## 2024-03-17 DIAGNOSIS — G894 Chronic pain syndrome: Secondary | ICD-10-CM | POA: Diagnosis not present

## 2024-03-17 DIAGNOSIS — Z79891 Long term (current) use of opiate analgesic: Secondary | ICD-10-CM | POA: Diagnosis not present

## 2024-03-23 ENCOUNTER — Other Ambulatory Visit: Payer: Self-pay

## 2024-03-23 ENCOUNTER — Other Ambulatory Visit: Payer: Self-pay | Admitting: Family Medicine

## 2024-03-23 MED ORDER — POTASSIUM CHLORIDE ER 10 MEQ PO TBCR
20.0000 meq | EXTENDED_RELEASE_TABLET | Freq: Every morning | ORAL | 1 refills | Status: DC
  Filled 2024-04-02: qty 60, 30d supply, fill #0
  Filled 2024-04-20 – 2024-05-05 (×5): qty 180, 90d supply, fill #1
  Filled 2024-05-05: qty 60, 30d supply, fill #1
  Filled 2024-05-13 – 2024-06-04 (×2): qty 60, 30d supply, fill #2

## 2024-03-23 MED FILL — Glipizide Tab ER 24HR 5 MG: ORAL | 90 days supply | Qty: 90 | Fill #1 | Status: CN

## 2024-03-23 MED FILL — Atorvastatin Calcium Tab 80 MG (Base Equivalent): ORAL | 30 days supply | Qty: 30 | Fill #3 | Status: CN

## 2024-03-24 ENCOUNTER — Ambulatory Visit: Payer: Self-pay | Admitting: Cardiology

## 2024-03-24 ENCOUNTER — Other Ambulatory Visit (HOSPITAL_COMMUNITY): Payer: Self-pay

## 2024-03-24 ENCOUNTER — Other Ambulatory Visit: Payer: Self-pay

## 2024-03-25 NOTE — Progress Notes (Unsigned)
 Brigitte Canard, PA-C [address] [place], [place]  [address] Phone: [phone]   Primary Care Physician: Aida House, MD  Primary Gastroenterologist:  Brigitte Canard, PA-C / Legrand Puma, MD   Chief Complaint: Medication refill; follow-up GERD   HPI:   Shelby Anderson is a 68 y.o. female, established patient of Dr. Elvin Hammer, presents for follow-up of GERD, nausea, vomiting, dysphagia, and diarrhea.  She last saw Jessica Zier, PA-C for these symptoms.  Continued on Nexium  40 Mg twice daily and added famotidine  20 Mg twice daily.  Continues Zofran  and Phenergan  as needed.  Given trial of Benefiber.  PMH: Significant for diabetes, GERD, Hx peptic stricture, hyperlipidemia, left breast cancer October 2022 s/p lumpectomy and chemoradiation. She is on multiple medications and has several medication allergies   05/2021 last EGD by Dr. Elvin Hammer: 1 benign moderate distal esophageal stenosis dilated with 54 [employer] dilator.  Hiatal hernia.  Otherwise normal clinic, esophagus, and duodenum.  07/2021 last colonoscopy by Dr. Elvin Hammer: Excellent prep.  One smal 2 mm benign colon mucosal polyp removed from cecum.  Otherwise normal.  10-year repeat.  Current Outpatient Medications  Medication Sig Dispense Refill   albuterol  (PROVENTIL ) (2.5 MG/3ML) 0.083% nebulizer solution Inhale 3 mLs (2.5 mg total) by nebulization every 6 (six) hours as needed for wheezing or shortness of breath (J45.40). 75 mL 5   albuterol  (VENTOLIN  HFA) 108 (90 Base) MCG/ACT inhaler Inhale 1-2 puffs into the lungs every 4 (four) hours as needed for wheezing or shortness of breath. 6.7 g 3   aspirin  EC 81 MG tablet Take 81 mg at bedtime by mouth.      atorvastatin  (LIPITOR) 80 MG tablet Take 1 tablet (80 mg total) by mouth at bedtime. 30 tablet 4   Blood Glucose Monitoring Suppl (ONE TOUCH ULTRA 2) w/Device KIT Use as directed 1 kit 1   budesonide -formoterol  (SYMBICORT ) 80-4.5 MCG/ACT inhaler Inhale 2 puffs into the  lungs every morning and another 2 puffs 12 hours later. 30.6 g 3   buPROPion  (WELLBUTRIN  XL) 150 MG 24 hr tablet Take 1 tablet (150 mg total) by mouth daily. 30 tablet 2   Calcium  Carbonate-Vitamin D 600-200 MG-UNIT TABS Take 1 tablet by mouth daily.     Cholecalciferol (VITAMIN D3) 50 MCG (2000 UT) capsule Take 2,000 Units by mouth daily.     CINNAMON PO Take 1,000 mg 2 (two) times daily by mouth.     dapagliflozin  propanediol (FARXIGA ) 10 MG TABS tablet Take 1 tablet (10 mg total) by mouth daily before breakfast. 90 tablet 1   doxepin  (SINEQUAN ) 50 MG capsule Take 1 capsule (50 mg total) by mouth at bedtime. 90 capsule 1   DULoxetine  (CYMBALTA ) 30 MG capsule Take 1 capsule (30 mg total) by mouth daily. 30 capsule 3   famotidine  (PEPCID ) 20 MG tablet Take 1 tablet (20 mg total) by mouth 2 (two) times daily. 180 tablet 1   fluticasone  (FLONASE ) 50 MCG/ACT nasal spray Place 1 spray into both nostrils at bedtime.     furosemide  (LASIX ) 20 MG tablet Take 1 tablet (20 mg total) by mouth daily as needed for ankle swelling. 180 tablet 1   glipiZIDE  (GLUCOTROL  XL) 5 MG 24 hr tablet Take 1 tablet (5 mg total) by mouth in the morning. 90 tablet 1   glucose blood (ONETOUCH ULTRA) test strip 1 each by Other route 2 (two) times daily. 100 strip 3   hydrALAZINE  (APRESOLINE ) 25 MG tablet  Take 1 tablet (25 mg total) by mouth daily as needed for blood pressure >140/65mmhg, up to three times a day 30 tablet 10   isosorbide  mononitrate (IMDUR ) 60 MG 24 hr tablet Take 1 tablet (60 mg total) by mouth every morning. 30 tablet 5   Lancets (ONETOUCH DELICA PLUS LANCET30G) MISC USE TO CHECK BLOOD SUGAR 2 TIMES A DAY 100 each 1   levothyroxine  (SYNTHROID ) 75 MCG tablet Take 1 tablet (75 mcg total) by mouth daily before breakfast. 30 tablet 4   lisinopril  (ZESTRIL ) 5 MG tablet Take 1 tablet (5 mg total) by mouth every morning. 30 tablet 2   LYRICA  150 MG capsule Take 150 mg by mouth 3 (three) times daily.   2   metFORMIN   (GLUCOPHAGE ) 500 MG tablet Take 1 tablet (500 mg total) by mouth 2 (two) times daily with a meal. 60 tablet 5   metoprolol  succinate (TOPROL -XL) 25 MG 24 hr tablet Take 1 tablet (25 mg total) by mouth daily. TAKE WITH OR IMMEDIATELY FOLLOWING A MEAL. 90 tablet 3   Misc Natural Products (GLUCOS-CHONDROIT-MSM COMPLEX PO) Take 2 tablets by mouth daily.     mometasone  (ELOCON ) 0.1 % cream Apply 1 Application topically daily as needed (for redness/raw). Apply topically behind ears     montelukast  (SINGULAIR ) 10 MG tablet Take 1 tablet (10 mg total) by mouth at bedtime. 90 tablet 3   Multiple Vitamin (MULITIVITAMIN WITH MINERALS) TABS Take 1 tablet by mouth daily.     NEXIUM  40 MG capsule Take 1 capsule (40 mg total) by mouth in the morning and at bedtime. 180 capsule 0   NEXIUM  40 MG capsule Take 1 capsule (40 mg total) by mouth daily at 12 noon. 90 capsule 1   NEXIUM  40 MG capsule Take 1 capsule (40 mg total) by mouth 2 (two) times daily. Office visit for further refills 180 capsule 1   nitroGLYCERIN  (NITROSTAT ) 0.4 MG SL tablet Place 1 tablet under the tongue every 5 minutes as needed for chest pain up to 3 times. If no relief, call 911. 25 tablet 4   NURTEC 75 MG TBDP Take 1 each by mouth daily as needed. (Patient taking differently: Take 75 mg by mouth daily as needed (for migraines).) 2 tablet 0   ondansetron  (ZOFRAN ) 4 MG tablet Take 1 tablet (4 mg total) by mouth every 8 (eight) hours as needed for nausea or vomiting. 80 tablet 2   ONETOUCH ULTRA test strip USE TO CHECK BLOOD SUGAR 2 TIMES A DAY 100 strip 3   oxyCODONE  (ROXICODONE ) 15 MG immediate release tablet Take 1 tablet by mouth four times a day (Patient taking differently: Take 15 mg by mouth every 8 (eight) hours as needed for pain.) 120 tablet 0   potassium chloride  (KLOR-CON ) 10 MEQ tablet Take 2 tablets (20 mEq total) by mouth every morning. 180 tablet 1   pregabalin  (LYRICA ) 150 MG capsule Take 1 capsule (150 mg total) by mouth 3 (three)  times daily. 90 capsule 2   promethazine  (PHENERGAN ) 25 MG tablet Take 1 tablet (25 mg total) by mouth at bedtime. 90 tablet 1   tiZANidine  (ZANAFLEX ) 4 MG tablet Take 2 tablets (8 mg total) by mouth 3 (three) times daily. 540 tablet 2   vitamin B-12 (CYANOCOBALAMIN ) 500 MCG tablet Take 500 mcg by mouth daily.      vitamin E 200 UNIT capsule Take 200 Units by mouth daily.     No current facility-administered medications for this visit.  Allergies as of 03/26/2024 - Review Complete 03/02/2024  Allergen Reaction Noted   Lodine [etodolac] Anaphylaxis, Hives, and Swelling 11/20/2011   Oxycontin  [oxycodone  hcl] Anaphylaxis 11/21/2011   Penicillins Anaphylaxis 11/20/2011   Aspirin  Other (See Comments) 11/20/2011   Darvocet [propoxyphene n-acetaminophen ] Hives 11/20/2011   Nitroglycerin  Other (See Comments) 12/19/2011   Propoxyphene Hives 11/20/2011   Tramadol Hives and Itching 10/08/2011   Valium Other (See Comments) 11/20/2011    Past Medical History:  Diagnosis Date   Allergy     Anemia    "chronic"   Angina    Prinzmetal angina   Anxiety and depression    Arthritis    Asthma    Atrial fibrillation (HCC)    h/o "AF w/frequent PVCs"   Breast cancer (HCC)    Left   Cancer (HCC)    hx of skin cancer    CHF (congestive heart failure) (HCC)    Chronic back pain greater than 3 months duration    on chronic narcotics, treated at pain clinic   Chronic respiratory failure (HCC) 09/15/2015   ONO 09/04/15 + desats >begin O2 at 2l/ m    Colon polyps    hyperplastic   Coronary artery disease    Arrythmia, orthostatic hypotension, HLD, HTN; sees Dr. Berry Bristol   Difficult intubation    "TMJ & woke up when they were still cutting on me"   Dysrhythmia    sees Dr. 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 Berry Bristol   OSA (obstructive sleep apnea)    Personal history of chemotherapy    Personal history of radiation therapy    Pneumonia    multiple times   PONV (postoperative nausea and vomiting)    Recurrent upper respiratory infection (URI)    S/P left TKA 09/25/2016   Shortness of breath 11/20/11   "all the time", sees pulmonlogy, ? asthma   Stenotic cervical os    Stomach ulcer    "3 small; found in 05/2011"   TMJ (dislocation of temporomandibular joint)    Tuberculosis    + TB SKIN TEST   Type 2 diabetes mellitus without complication, without long-term current use of insulin  (HCC) 12/06/2015   not on meds     Past Surgical History:  Procedure Laterality Date   ACHILLES TENDON REPAIR  1970's   left ankle   ARTHROSCOPIC REPAIR ACL     left knee cap   BREAST BIOPSY Right 04/08/2013   again in October or November 2020   BREAST LUMPECTOMY Left 12/09/2019   BREAST LUMPECTOMY WITH RADIOACTIVE SEED AND SENTINEL LYMPH NODE BIOPSY Left 12/09/2019   Procedure: LEFT BREAST LUMPECTOMY WITH RADIOACTIVE SEED AND LEFT AXILLARY SENTINEL LYMPH NODE BIOPSY;  Surgeon: Enid Harry,  MD;  Location: [facility] OR;  Service: General;  Laterality: Left;  PEC BLOCK   CARDIAC CATHETERIZATION     loop recorder   CARPAL TUNNEL RELEASE  unknown   left hand   COLONOSCOPY     ESOPHAGOGASTRODUODENOSCOPY (EGD) WITH PROPOFOL  N/A 10/29/2017   Procedure: ESOPHAGOGASTRODUODENOSCOPY (EGD) WITH PROPOFOL ;  Surgeon: Tobin Forts, MD;  Location: [facility];  Service: Endoscopy;  Laterality: N/A;   LOOP RECORDER  IMPLANT     PORT-A-CATH REMOVAL N/A 12/09/2019   Procedure: REMOVAL PORT-A-CATH;  Surgeon: Enid Harry, MD;  Location: [facility] OR;  Service: General;  Laterality: N/A;   PORTACATH PLACEMENT N/A 09/23/2019   Procedure: INSERTION PORT-A-CATH Right Internal Fronie Jewett;  Surgeon: Enid Harry, MD;  Location: [facility] OR;  Service: General;  Laterality: N/A;   post ganglionectomy  1970's   "for migraine headaches"   pouch string  16,10,96   "did this 3 times (once w/each pregnancy)"   SAVORY DILATION N/A 10/29/2017   Procedure: SAVORY DILATION;  Surgeon: Tobin Forts, MD;  Location: [facility];  Service: Endoscopy;  Laterality: N/A;   TOTAL KNEE ARTHROPLASTY Left 09/25/2016   Procedure: LEFT TOTAL KNEE ARTHROPLASTY;  Surgeon: Claiborne Crew, MD;  Location: [facility];  Service: Orthopedics;  Laterality: Left;   TUBAL LIGATION  1980's    Review of Systems:    All systems reviewed and negative except where noted in HPI.    Physical Exam:  There were no vitals taken for this visit. No LMP recorded. Patient is postmenopausal.  General: Well-nourished, well-developed in no acute distress.  Lungs: Clear to auscultation bilaterally. Non-labored. Heart: Regular rate and rhythm, no murmurs rubs or gallops.  Abdomen: Bowel sounds are normal; Abdomen is Soft; No hepatosplenomegaly, masses or hernias;  No Abdominal Tenderness; No guarding or rebound tenderness. Neuro: Alert and oriented x 3.  Grossly intact.  Psych: Alert and cooperative, normal mood and affect.   Imaging Studies: No results found.  Labs: CBC    Component Value Date/Time   WBC 8.9 11/13/2023 1530   RBC 4.69 11/13/2023 1530   HGB 13.4 11/13/2023 1530   HGB 11.3 (L) 11/05/2019 1100   HCT 42.0 11/13/2023 1530   PLT 359 11/13/2023 1530   PLT 407 (H) 11/05/2019 1100   MCV 89.6 11/13/2023 1530   MCH 28.6 11/13/2023 1530   MCHC 31.9 11/13/2023 1530   RDW 14.7 11/13/2023 1530   LYMPHSABS 1.5 11/13/2023 1530    MONOABS 0.7 11/13/2023 1530   EOSABS 0.3 11/13/2023 1530   BASOSABS 0.1 11/13/2023 1530    CMP     Component Value Date/Time   NA 144 11/13/2023 1530   K 3.7 11/13/2023 1530   CL 103 11/13/2023 1530   CO2 22 11/13/2023 1530   GLUCOSE 109 (H) 11/13/2023 1530   BUN 21 11/13/2023 1530   CREATININE 0.97 11/13/2023 1530   CREATININE 0.67 09/30/2020 1533   CALCIUM  10.2 11/13/2023 1530   PROT 7.5 11/13/2023 1530   ALBUMIN 3.0 (L) 11/13/2023 1530   AST 31 11/13/2023 1530   AST 29 11/05/2019 1100   ALT 25 11/13/2023 1530   ALT 36 11/05/2019 1100   ALKPHOS 67 11/13/2023 1530   BILITOT 0.4 11/13/2023 1530   BILITOT <0.2 (L) 11/05/2019 1100   GFRNONAA >60 11/13/2023 1530   GFRNONAA >60 11/05/2019 1100   GFRAA >60 12/08/2019 1025   GFRAA >60 11/05/2019 1100       Assessment and Plan:   Shelby Anderson is a 68  y.o. y/o female returns for annual follow-up of  1.  Chronic GERD  2.  History of esophageal stricture; last dilated in 2022; currently asymptomatic    Brigitte Canard, PA-C  Follow up ***

## 2024-03-26 ENCOUNTER — Ambulatory Visit: Admitting: Physician Assistant

## 2024-03-26 ENCOUNTER — Encounter: Payer: Self-pay | Admitting: Physician Assistant

## 2024-03-26 VITALS — BP 110/70 | HR 70 | Ht 66.0 in | Wt 217.0 lb

## 2024-03-26 DIAGNOSIS — K219 Gastro-esophageal reflux disease without esophagitis: Secondary | ICD-10-CM | POA: Diagnosis not present

## 2024-03-26 DIAGNOSIS — Z8719 Personal history of other diseases of the digestive system: Secondary | ICD-10-CM

## 2024-03-26 MED ORDER — FAMOTIDINE 20 MG PO TABS
20.0000 mg | ORAL_TABLET | Freq: Two times a day (BID) | ORAL | 3 refills | Status: AC
  Filled 2024-04-07: qty 180, 90d supply, fill #0
  Filled 2024-04-20 – 2024-05-13 (×5): qty 180, 90d supply, fill #1
  Filled 2024-06-04: qty 60, 30d supply, fill #1
  Filled 2024-06-07 – 2024-09-08 (×3): qty 180, 90d supply, fill #1
  Filled 2024-09-14 – 2024-09-16 (×2): qty 60, 30d supply, fill #1
  Filled 2024-10-12: qty 60, 30d supply, fill #2
  Filled 2024-11-05 – 2024-11-09 (×3): qty 60, 30d supply, fill #3
  Filled 2024-12-09: qty 60, 30d supply, fill #4
  Filled 2025-01-08 (×2): qty 60, 30d supply, fill #5

## 2024-03-26 MED ORDER — NEXIUM 40 MG PO CPDR: 40.0000 mg | DELAYED_RELEASE_CAPSULE | Freq: Two times a day (BID) | ORAL | 3 refills | Status: DC

## 2024-03-26 NOTE — Progress Notes (Signed)
 Noted.

## 2024-03-26 NOTE — Patient Instructions (Signed)
 Your medications have been refilled  Follow up in 1 year  _______________________________________________________  If your blood pressure at your visit was 140/90 or greater, please contact your primary care physician to follow up on this.  _______________________________________________________  If you are age 66 or older, your body mass index should be between 23-30. Your Body mass index is 35.02 kg/m. If this is out of the aforementioned range listed, please consider follow up with your Primary Care Provider.  If you are age 60 or younger, your body mass index should be between 19-25. Your Body mass index is 35.02 kg/m. If this is out of the aformentioned range listed, please consider follow up with your Primary Care Provider.   ________________________________________________________  The [facility] providers would like to encourage you to use MYCHART to communicate with providers for non-urgent requests or questions.  Due to long hold times on the telephone, sending your provider a message by [facility] may be a faster and more efficient way to get a response.  Please allow 48 business hours for a response.  Please remember that this is for non-urgent requests.  _______________________________________________________   I appreciate the  opportunity to care for you  Thank You   Tina Garrett,PA-C

## 2024-03-28 ENCOUNTER — Other Ambulatory Visit (HOSPITAL_COMMUNITY): Payer: Self-pay

## 2024-04-02 ENCOUNTER — Other Ambulatory Visit (HOSPITAL_COMMUNITY): Payer: Self-pay

## 2024-04-02 ENCOUNTER — Other Ambulatory Visit: Payer: Self-pay

## 2024-04-02 MED ORDER — GLIPIZIDE ER 5 MG PO TB24
5.0000 mg | ORAL_TABLET | ORAL | 1 refills | Status: DC
  Filled 2024-04-02: qty 30, 30d supply, fill #0
  Filled 2024-04-20 – 2024-04-28 (×2): qty 30, 30d supply, fill #1
  Filled 2024-05-13 – 2024-06-04 (×2): qty 30, 30d supply, fill #2

## 2024-04-02 MED ORDER — GLIPIZIDE 5 MG PO TABS
5.0000 mg | ORAL_TABLET | ORAL | 1 refills | Status: DC
  Filled 2024-04-02: qty 30, 30d supply, fill #0

## 2024-04-02 MED FILL — Glipizide Tab ER 24HR 5 MG: ORAL | 90 days supply | Qty: 90 | Fill #1 | Status: CN

## 2024-04-02 MED FILL — Atorvastatin Calcium Tab 80 MG (Base Equivalent): ORAL | 30 days supply | Qty: 30 | Fill #3 | Status: AC

## 2024-04-02 NOTE — Progress Notes (Signed)
 Lm for patient that ppw has been recevd by treating Dr for DM shoes / Items are in cupboard and is OK for her to just pick up as she has already tried on  ABN with her shoes and needs to be signed and left on my desk  Thank you, Kerney Pee

## 2024-04-03 ENCOUNTER — Other Ambulatory Visit: Payer: Self-pay

## 2024-04-06 ENCOUNTER — Other Ambulatory Visit: Payer: Self-pay

## 2024-04-07 ENCOUNTER — Telehealth: Payer: Self-pay | Admitting: Physician Assistant

## 2024-04-07 ENCOUNTER — Other Ambulatory Visit: Payer: Self-pay

## 2024-04-07 ENCOUNTER — Other Ambulatory Visit (HOSPITAL_COMMUNITY): Payer: Self-pay

## 2024-04-07 DIAGNOSIS — M2041 Other hammer toe(s) (acquired), right foot: Secondary | ICD-10-CM | POA: Diagnosis not present

## 2024-04-07 DIAGNOSIS — E1151 Type 2 diabetes mellitus with diabetic peripheral angiopathy without gangrene: Secondary | ICD-10-CM | POA: Diagnosis not present

## 2024-04-07 DIAGNOSIS — M2042 Other hammer toe(s) (acquired), left foot: Secondary | ICD-10-CM | POA: Diagnosis not present

## 2024-04-07 NOTE — Telephone Encounter (Signed)
 Called patient back and said she got it straight it was sent to the wrong pharmacy and she [facility]

## 2024-04-07 NOTE — Telephone Encounter (Signed)
 Inbound call from patient, states she is at the pharmacy and they have not received the pepcid  prescription, patient would like it called back in.

## 2024-04-09 ENCOUNTER — Other Ambulatory Visit: Payer: Self-pay | Admitting: Family Medicine

## 2024-04-13 ENCOUNTER — Other Ambulatory Visit: Payer: Self-pay

## 2024-04-14 DIAGNOSIS — Z79891 Long term (current) use of opiate analgesic: Secondary | ICD-10-CM | POA: Diagnosis not present

## 2024-04-14 DIAGNOSIS — G4700 Insomnia, unspecified: Secondary | ICD-10-CM | POA: Diagnosis not present

## 2024-04-14 DIAGNOSIS — G894 Chronic pain syndrome: Secondary | ICD-10-CM | POA: Diagnosis not present

## 2024-04-14 DIAGNOSIS — M47816 Spondylosis without myelopathy or radiculopathy, lumbar region: Secondary | ICD-10-CM | POA: Diagnosis not present

## 2024-04-20 ENCOUNTER — Other Ambulatory Visit: Payer: Self-pay

## 2024-04-20 ENCOUNTER — Other Ambulatory Visit: Payer: Self-pay | Admitting: Family Medicine

## 2024-04-20 ENCOUNTER — Other Ambulatory Visit: Payer: Self-pay | Admitting: Cardiology

## 2024-04-20 ENCOUNTER — Other Ambulatory Visit (HOSPITAL_COMMUNITY): Payer: Self-pay

## 2024-04-20 MED ORDER — LISINOPRIL 5 MG PO TABS
5.0000 mg | ORAL_TABLET | Freq: Every morning | ORAL | 3 refills | Status: DC
  Filled 2024-04-28: qty 30, 30d supply, fill #0
  Filled 2024-05-13 – 2024-06-04 (×2): qty 30, 30d supply, fill #1
  Filled 2024-07-07: qty 30, 30d supply, fill #2
  Filled 2024-08-04: qty 30, 30d supply, fill #3

## 2024-04-20 MED ORDER — BUPROPION HCL ER (XL) 150 MG PO TB24
150.0000 mg | ORAL_TABLET | Freq: Every day | ORAL | 3 refills | Status: DC
  Filled 2024-04-28: qty 30, 30d supply, fill #0
  Filled 2024-05-13 – 2024-06-04 (×2): qty 30, 30d supply, fill #1
  Filled 2024-07-07: qty 30, 30d supply, fill #2
  Filled 2024-08-04: qty 30, 30d supply, fill #3

## 2024-04-20 MED FILL — Levothyroxine Sodium Tab 75 MCG: ORAL | 30 days supply | Qty: 30 | Fill #0 | Status: CN

## 2024-04-20 MED FILL — Atorvastatin Calcium Tab 80 MG (Base Equivalent): ORAL | 30 days supply | Qty: 30 | Fill #4 | Status: CN

## 2024-04-21 ENCOUNTER — Encounter (HOSPITAL_COMMUNITY): Payer: Self-pay

## 2024-04-21 ENCOUNTER — Other Ambulatory Visit (HOSPITAL_COMMUNITY): Payer: Self-pay

## 2024-04-21 ENCOUNTER — Other Ambulatory Visit: Payer: Self-pay

## 2024-04-22 ENCOUNTER — Other Ambulatory Visit (HOSPITAL_COMMUNITY): Payer: Self-pay

## 2024-04-25 ENCOUNTER — Other Ambulatory Visit (HOSPITAL_COMMUNITY): Payer: Self-pay

## 2024-04-28 ENCOUNTER — Other Ambulatory Visit (HOSPITAL_COMMUNITY): Payer: Self-pay

## 2024-04-28 ENCOUNTER — Other Ambulatory Visit: Payer: Self-pay

## 2024-04-28 MED FILL — Levothyroxine Sodium Tab 75 MCG: ORAL | 30 days supply | Qty: 30 | Fill #0 | Status: AC

## 2024-04-28 MED FILL — Atorvastatin Calcium Tab 80 MG (Base Equivalent): ORAL | 30 days supply | Qty: 30 | Fill #4 | Status: AC

## 2024-04-29 DIAGNOSIS — D3132 Benign neoplasm of left choroid: Secondary | ICD-10-CM | POA: Diagnosis not present

## 2024-04-29 DIAGNOSIS — H04123 Dry eye syndrome of bilateral lacrimal glands: Secondary | ICD-10-CM | POA: Diagnosis not present

## 2024-04-29 DIAGNOSIS — E119 Type 2 diabetes mellitus without complications: Secondary | ICD-10-CM | POA: Diagnosis not present

## 2024-04-29 DIAGNOSIS — H2513 Age-related nuclear cataract, bilateral: Secondary | ICD-10-CM | POA: Diagnosis not present

## 2024-04-29 LAB — HM DIABETES EYE EXAM

## 2024-05-01 ENCOUNTER — Other Ambulatory Visit: Payer: Self-pay

## 2024-05-01 ENCOUNTER — Other Ambulatory Visit (HOSPITAL_COMMUNITY): Payer: Self-pay

## 2024-05-02 ENCOUNTER — Other Ambulatory Visit (HOSPITAL_COMMUNITY): Payer: Self-pay

## 2024-05-04 ENCOUNTER — Telehealth: Payer: Self-pay | Admitting: *Deleted

## 2024-05-04 ENCOUNTER — Other Ambulatory Visit: Payer: Self-pay | Admitting: Family Medicine

## 2024-05-04 ENCOUNTER — Other Ambulatory Visit: Payer: Self-pay

## 2024-05-04 ENCOUNTER — Other Ambulatory Visit (HOSPITAL_COMMUNITY): Payer: Self-pay

## 2024-05-04 ENCOUNTER — Other Ambulatory Visit (HOSPITAL_BASED_OUTPATIENT_CLINIC_OR_DEPARTMENT_OTHER): Payer: Self-pay

## 2024-05-04 DIAGNOSIS — F331 Major depressive disorder, recurrent, moderate: Secondary | ICD-10-CM

## 2024-05-04 MED ORDER — DULOXETINE HCL 60 MG PO CPEP
60.0000 mg | ORAL_CAPSULE | Freq: Every day | ORAL | 3 refills | Status: DC
  Filled 2024-05-04: qty 30, 30d supply, fill #0
  Filled 2024-05-13 – 2024-06-04 (×2): qty 30, 30d supply, fill #1
  Filled 2024-07-07: qty 30, 30d supply, fill #2
  Filled 2024-08-04: qty 30, 30d supply, fill #3

## 2024-05-04 NOTE — Telephone Encounter (Signed)
 See prior note

## 2024-05-04 NOTE — Telephone Encounter (Signed)
 Copied from CRM [id number]. Topic: General - Other >> May 04, 2024 12:11 PM [place] J wrote: Reason for CRM: Patient wanted to let Dr. Bambi Lever know that the DULoxetine  (CYMBALTA ) 30 MG capsule has been working out for here and if Dr. Bambi Lever wanted to up the dose on this medication, to please send it to:  [id number], [place] - [address] [place] [address] Phone: [phone] Fax: [phone] Hours: Not open 24 hours

## 2024-05-04 NOTE — Telephone Encounter (Signed)
 Copied from CRM [id number]. Topic: General - Other >> May 01, 2024  2:46 PM Caliyah H wrote: Reason for CRM: Patient called to inform Dr. Bambi Lever that she has been taking the Cymbalta  for a month. She reports that it has helped with her symptoms, it has not provided significant relief.

## 2024-05-04 NOTE — Telephone Encounter (Signed)
 Copied from CRM [id number]. Topic: Clinical - Medication Refill >> May 04, 2024 12:07 PM [place] J wrote: Medication: metoprolol  succinate (TOPROL -XL) 25 MG 24 hr tablet  Has the patient contacted their pharmacy? Yes (Agent: If no, request that the patient contact the pharmacy for the refill. If patient does not wish to contact the pharmacy document the reason why and proceed with request.) (Agent: If yes, when and what did the pharmacy advise?)  This is the patient's preferred pharmacy:   [place] [facility] [address] [place] [address] Phone: [phone] Fax: [phone]  Is this the correct pharmacy for this prescription? Yes If no, delete pharmacy and type the correct one.   Has the prescription been filled recently? No  Is the patient out of the medication? Yes  Has the patient been seen for an appointment in the last year OR does the patient have an upcoming appointment? Yes  Can we respond through MyChart? Yes  Agent: Please be advised that Rx refills may take up to 3 business days. We ask that you follow-up with your pharmacy.

## 2024-05-04 NOTE — Telephone Encounter (Signed)
 Would she be agreeable to increasing the dose to 60 mg? Or would she prefer to stay on the 30 mg?

## 2024-05-04 NOTE — Telephone Encounter (Signed)
 I sent in the 60 mg capsules

## 2024-05-04 NOTE — Telephone Encounter (Signed)
Left a detailed message with the information below at the patient's cell number and requested she call back with her preference. 

## 2024-05-05 ENCOUNTER — Telehealth: Payer: Self-pay | Admitting: Cardiology

## 2024-05-05 ENCOUNTER — Other Ambulatory Visit: Payer: Self-pay | Admitting: Cardiology

## 2024-05-05 ENCOUNTER — Other Ambulatory Visit: Payer: Self-pay

## 2024-05-05 ENCOUNTER — Other Ambulatory Visit (HOSPITAL_COMMUNITY): Payer: Self-pay

## 2024-05-05 ENCOUNTER — Other Ambulatory Visit (HOSPITAL_BASED_OUTPATIENT_CLINIC_OR_DEPARTMENT_OTHER): Payer: Self-pay

## 2024-05-05 MED ORDER — METOPROLOL SUCCINATE ER 25 MG PO TB24
25.0000 mg | ORAL_TABLET | Freq: Every day | ORAL | 0 refills | Status: DC
  Filled 2024-05-05: qty 30, 30d supply, fill #0
  Filled 2024-05-05: qty 90, 90d supply, fill #0
  Filled 2024-05-13 – 2024-06-04 (×2): qty 30, 30d supply, fill #1
  Filled 2024-07-07: qty 30, 30d supply, fill #2

## 2024-05-05 MED ORDER — PREGABALIN 150 MG PO CAPS
150.0000 mg | ORAL_CAPSULE | Freq: Three times a day (TID) | ORAL | 2 refills | Status: DC
  Filled 2024-05-05 – 2024-06-07 (×2): qty 90, 30d supply, fill #0

## 2024-05-05 NOTE — Telephone Encounter (Signed)
*  STAT* If patient is at the pharmacy, call can be transferred to refill team.   1. Which medications need to be refilled? (please list name of each medication and dose if known)   metoprolol succinate (TOPROL-XL) 25 MG 24 hr tablet    2. Which pharmacy/location (including street and city if local pharmacy) is medication to be sent to? [place] [facility]   3. Do they need a 30 day or 90 day supply? 90

## 2024-05-05 NOTE — Telephone Encounter (Signed)
 Pt's medication was sent to pt's pharmacy as requested. Confirmation received.

## 2024-05-06 ENCOUNTER — Other Ambulatory Visit (HOSPITAL_COMMUNITY): Payer: Self-pay

## 2024-05-07 ENCOUNTER — Other Ambulatory Visit (HOSPITAL_COMMUNITY): Payer: Self-pay

## 2024-05-13 ENCOUNTER — Other Ambulatory Visit: Payer: Self-pay | Admitting: Family Medicine

## 2024-05-13 ENCOUNTER — Other Ambulatory Visit (HOSPITAL_COMMUNITY): Payer: Self-pay

## 2024-05-13 ENCOUNTER — Other Ambulatory Visit: Payer: Self-pay

## 2024-05-13 MED ORDER — ATORVASTATIN CALCIUM 80 MG PO TABS
80.0000 mg | ORAL_TABLET | Freq: Every day | ORAL | 4 refills | Status: DC
  Filled 2024-06-04: qty 30, 30d supply, fill #0
  Filled 2024-07-07: qty 30, 30d supply, fill #1
  Filled 2024-08-04: qty 30, 30d supply, fill #2
  Filled 2024-09-03 – 2024-09-16 (×4): qty 30, 30d supply, fill #3
  Filled 2024-10-12: qty 30, 30d supply, fill #4

## 2024-05-13 MED FILL — Levothyroxine Sodium Tab 75 MCG: ORAL | 30 days supply | Qty: 30 | Fill #1 | Status: CN

## 2024-05-18 ENCOUNTER — Telehealth: Payer: Self-pay

## 2024-05-21 ENCOUNTER — Other Ambulatory Visit (HOSPITAL_COMMUNITY): Payer: Self-pay

## 2024-05-25 ENCOUNTER — Telehealth: Payer: Self-pay

## 2024-05-25 DIAGNOSIS — E119 Type 2 diabetes mellitus without complications: Secondary | ICD-10-CM

## 2024-05-25 MED ORDER — DAPAGLIFLOZIN PROPANEDIOL 10 MG PO TABS: 10.0000 mg | ORAL_TABLET | Freq: Every day | ORAL | 1 refills | Status: DC

## 2024-05-25 NOTE — Progress Notes (Signed)
   05/25/2024  Patient ID: Shelby Anderson, female   DOB: January 08, 1956, 68 y.o.   MRN: [id number]  Received request from [employer]  [facility]AP for a new rx for patient. PCP had already signed off for refills for 12/04/2023 through 12/02/2024 but company confirms new rx needed.  Resending order for remainder of year.  Jon VEAR Lindau, PharmD Clinical Pharmacist [phone]

## 2024-05-27 ENCOUNTER — Other Ambulatory Visit: Payer: Self-pay | Admitting: Family Medicine

## 2024-05-27 DIAGNOSIS — F331 Major depressive disorder, recurrent, moderate: Secondary | ICD-10-CM

## 2024-06-04 ENCOUNTER — Other Ambulatory Visit (HOSPITAL_COMMUNITY): Payer: Self-pay

## 2024-06-04 ENCOUNTER — Other Ambulatory Visit: Payer: Self-pay

## 2024-06-04 MED FILL — Levothyroxine Sodium Tab 75 MCG: ORAL | 30 days supply | Qty: 30 | Fill #1 | Status: AC

## 2024-06-08 ENCOUNTER — Ambulatory Visit: Admitting: Podiatry

## 2024-06-08 ENCOUNTER — Other Ambulatory Visit: Payer: Self-pay

## 2024-06-08 ENCOUNTER — Other Ambulatory Visit (HOSPITAL_COMMUNITY): Payer: Self-pay

## 2024-06-09 ENCOUNTER — Other Ambulatory Visit: Payer: Self-pay

## 2024-06-11 ENCOUNTER — Other Ambulatory Visit: Payer: Self-pay

## 2024-06-17 DIAGNOSIS — M47816 Spondylosis without myelopathy or radiculopathy, lumbar region: Secondary | ICD-10-CM | POA: Diagnosis not present

## 2024-06-17 DIAGNOSIS — G4700 Insomnia, unspecified: Secondary | ICD-10-CM | POA: Diagnosis not present

## 2024-06-17 DIAGNOSIS — G894 Chronic pain syndrome: Secondary | ICD-10-CM | POA: Diagnosis not present

## 2024-06-17 DIAGNOSIS — Z79891 Long term (current) use of opiate analgesic: Secondary | ICD-10-CM | POA: Diagnosis not present

## 2024-06-19 ENCOUNTER — Other Ambulatory Visit (HOSPITAL_COMMUNITY): Payer: Self-pay

## 2024-06-19 ENCOUNTER — Ambulatory Visit: Attending: Cardiology | Admitting: Cardiology

## 2024-06-19 ENCOUNTER — Encounter: Payer: Self-pay | Admitting: Cardiology

## 2024-06-19 ENCOUNTER — Other Ambulatory Visit: Payer: Self-pay | Admitting: *Deleted

## 2024-06-19 VITALS — BP 126/60 | HR 72 | Ht 66.0 in | Wt 214.0 lb

## 2024-06-19 DIAGNOSIS — R600 Localized edema: Secondary | ICD-10-CM

## 2024-06-19 DIAGNOSIS — I10 Essential (primary) hypertension: Secondary | ICD-10-CM

## 2024-06-19 MED ORDER — POTASSIUM CHLORIDE ER 10 MEQ PO TBCR: EXTENDED_RELEASE_TABLET | ORAL | Status: DC

## 2024-06-19 MED ORDER — SPIRONOLACTONE 25 MG PO TABS
25.0000 mg | ORAL_TABLET | ORAL | 2 refills | Status: DC
  Filled 2024-06-19: qty 30, 30d supply, fill #0
  Filled 2024-07-07 – 2024-07-13 (×2): qty 30, 30d supply, fill #1
  Filled 2024-08-04: qty 30, 30d supply, fill #2

## 2024-06-19 NOTE — Patient Instructions (Addendum)
 Medication Instructions:  Your physician has recommended you make the following change in your medication: Start spironolactone 25 mg by mouth daily  Change potassium to as needed --take with furosemide   *If you need a refill on your cardiac medications before your next appointment, please call your pharmacy*  Lab Work: Have lab work done in 3 weeks.  BMP.  This can be done at any [facility] location If you have labs (blood work) drawn today and your tests are completely normal, you will receive your results only by: MyChart Message (if you have MyChart) OR A paper copy in the mail If you have any lab test that is abnormal or we need to change your treatment, we will call you to review the results.  Testing/Procedures: none  Follow-Up: At [facility], you and your health needs are our priority.  As part of our continuing mission to provide you with exceptional heart care, our providers are all part of one team.  This team includes your primary Cardiologist (physician) and Advanced Practice Providers or APPs (Physician Assistants and Nurse Practitioners) who all work together to provide you with the care you need, when you need it.  Your next appointment:   As needed  Provider:   Gordy Bergamo, MD    We recommend signing up for the patient portal called MyChart.  Sign up information is provided on this After Visit Summary.  MyChart is used to connect with patients for Virtual Visits (Telemedicine).  Patients are able to view lab/test results, encounter notes, upcoming appointments, etc.  Non-urgent messages can be sent to your provider as well.   To learn more about what you can do with MyChart, go to [email or web address].   Other Instructions

## 2024-06-19 NOTE — Progress Notes (Signed)
 Cardiology Office Note:  .   Date:  06/20/2024  ID:  Shelby Anderson, DOB 01-22-1956, MRN [id number] PCP: Ozell Heron HERO, MD  [facility] Cardiologist:  Gordy Bergamo, MD   History of Present Illness: .   Shelby Anderson is a 68 y.o. obese Caucasian female with history of NSTEMI with normal coronary arteries, diabetes mellitus, orthostatic hypotension, chronic chest pain syndrome, recurrent syncope status post loop recorder implantation by [facility] cardiology but no obvious etiology found and not loop has reached EOL. She also has history of chronic severe back pain, bronchial asthma and severe restrictive lung disease, severe GERD, distal esophageal stricture status post dilation, peptic ulcer disease, depression. Patient is a retired Engineer, civil (consulting).  Presents here to discuss leg edema.  Otherwise remains stable and has been losing weight with very aggressive diet and has noticed marked improvement in dyspnea and overall wellbeing.  Discussed the use of AI scribe software for clinical note transcription with the patient, who gave verbal consent to proceed.  History of Present Illness Shelby Anderson is a 68 year old female who presents with persistent leg swelling.  She experiences persistent leg swelling despite taking double doses of Lasix . The swelling was particularly severe a few days ago, described as 'four plus stick in a day.' Her potassium levels are consistently low, requiring daily supplementation.  She experiences occasional chest pain, which she considers 'normal' with no significant changes. Her current medications include atorvastatin  80 mg once daily, metoprolol  25 mg once daily, and isosorbide  mononitrate 60 mg once daily.  She has experienced stress due to recent family events, which she believes has contributed to her blood pressure being in the 130-140 range recently. She does not smoke and has lost approximately 30-32 pounds over the past few  months.   Labs   Lab Results  Component Value Date   CHOL 118 02/07/2024   HDL 40.90 02/07/2024   LDLCALC 57 02/07/2024   TRIG 102.0 02/07/2024   CHOLHDL 3 02/07/2024   No results found for: LIPOA  Lab Results  Component Value Date   NA 144 11/13/2023   K 3.7 11/13/2023   CO2 22 11/13/2023   GLUCOSE 109 (H) 11/13/2023   BUN 21 11/13/2023   CREATININE 0.97 11/13/2023   CALCIUM  10.2 11/13/2023   GFR 76.47 01/29/2023   GFRNONAA >60 11/13/2023      Latest Ref Rng & Units 11/13/2023    3:30 PM 08/01/2023    3:22 AM 07/31/2023   11:54 AM  BMP  Glucose 70 - 99 mg/dL 890  851  829   BUN 8 - 23 mg/dL 21  20  18    Creatinine 0.44 - 1.00 mg/dL 9.02  9.15  9.03   Sodium 135 - 145 mmol/L 144  141  141   Potassium 3.5 - 5.1 mmol/L 3.7  3.7  3.5   Chloride 98 - 111 mmol/L 103  106  102   CO2 22 - 32 mmol/L 22  23  24    Calcium  8.9 - 10.3 mg/dL 89.7  8.9  9.5       Latest Ref Rng & Units 11/13/2023    3:30 PM 08/01/2023    3:22 AM 07/31/2023   11:54 AM  CBC  WBC 4.0 - 10.5 K/uL 8.9  9.3  8.8   Hemoglobin 12.0 - 15.0 g/dL 86.5  87.6  86.0   Hematocrit 36.0 - 46.0 % 42.0  38.4  43.2   Platelets 150 - 400  K/uL 359  217  246    Lab Results  Component Value Date   HGBA1C 7.0 (A) 02/06/2024    Lab Results  Component Value Date   TSH 2.01 02/07/2024     ROS  Review of Systems  Cardiovascular:  Positive for dyspnea on exertion (chronic and improved) and leg swelling. Negative for chest pain.   Physical Exam:   VS:  BP 126/60 (BP Location: Right Arm, Patient Position: Sitting)   Pulse 72   Ht 5' 6 (1.676 m)   Wt 214 lb (97.1 kg)   SpO2 96%   BMI 34.54 kg/m    Wt Readings from Last 3 Encounters:  06/19/24 214 lb (97.1 kg)  03/26/24 217 lb (98.4 kg)  02/06/24 220 lb 6.4 oz (100 kg)    Physical Exam Neck:     Vascular: No carotid bruit or JVD.  Cardiovascular:     Rate and Rhythm: Normal rate and regular rhythm.     Pulses: Intact distal pulses.     Heart  sounds: Normal heart sounds. No murmur heard.    No gallop.  Pulmonary:     Effort: Pulmonary effort is normal.     Breath sounds: Normal breath sounds.  Abdominal:     General: Bowel sounds are normal.     Palpations: Abdomen is soft.  Musculoskeletal:     Right lower leg: Edema (1-2+ below knee pitting edema) present.     Left lower leg: Edema (1-2+ below knee pitting edema) present.    Studies Reviewed: SABRA    Exercise nuclear stress test 09/27/2021: Normal ECG stress. The patient exercised for 7 minutes and 13 seconds of a Bruce protocol, achieving approximately 5.91 METs. Normal BP response. Reduced exercise tolerance. Breast tissue attenuation noted in the inferior wall.  Normal myocardial perfusion without ischemi aor scar. Gated SPECT imaging of the left ventricle was normal. All segments of left ventricle demonstrated normal wall motion and thickening. Normal left ventricle.  . No stress lung uptake. TID is normal. Stress LV EF is normal 58%. No previous exam available for comparison. Low risk study.   Echocardiogram 09/27/2021: Normal LV systolic function with visual EF 60-65%. Left ventricle cavity is normal in size. Mild left ventricular hypertrophy. Normal global wall motion. Normal diastolic filling pattern, normal LAP. No significant valvular heart disease. Compared to study 10/01/2019 no significant change. EKG:    EKG Interpretation Date/Time:  Friday June 19 2024 15:33:44 EDT Ventricular Rate:  72 PR Interval:  170 QRS Duration:  100 QT Interval:  414 QTC Calculation: 453 R Axis:   13  Text Interpretation: EKG 06/19/2024: Normal sinus rhythm with rate of 72 bpm, normal axis.  Poor R wave progression.  Borderline low voltage complexes.  Compared to 10/30/2019, no significant change. Confirmed by Sharmila Wrobleski, Jagadeesh ([id number]) on 06/19/2024 3:53:14 PM    Medications ordered    Meds ordered this encounter  Medications   spironolactone  (ALDACTONE ) 25 MG tablet    Sig:  Take 1 tablet (25 mg total) by mouth every morning.    Dispense:  30 tablet    Refill:  2   potassium chloride  (KLOR-CON ) 10 MEQ tablet    Sig: Take 2 tablets by mouth daily as needed--take on days you take furosemide      ASSESSMENT AND PLAN: .      ICD-10-CM   1. Essential hypertension  I10 EKG 12-Lead    spironolactone  (ALDACTONE ) 25 MG tablet    Basic Metabolic Panel (BMET)  CANCELED: Basic Metabolic Panel (BMET)    2. Bilateral leg edema  R60.0 spironolactone  (ALDACTONE ) 25 MG tablet    Basic Metabolic Panel (BMET)    CANCELED: Basic Metabolic Panel (BMET)      Assessment and Plan Assessment & Plan Edema Persistent edema despite double dose of Lasix , indicating ineffective management of swelling. - Discontinue Lasix  as a regular medication and use as needed. - Initiate spironolactone  therapy to manage fluid retention and provide cardiovascular and renal protection. - Monitor potassium levels and kidney function in 2-3 weeks at [facility].  Hypertension Blood pressure is well controlled with current medication regimen, though recent stressors have caused occasional elevations. - Continue current antihypertensive regimen. - Add spironolactone  to aid in blood pressure control.  Hypokalemia Chronic low potassium levels requiring supplementation, with potential increase due to spironolactone . - Change potassium supplementation to as needed, only if taking Lasix  as we are starting spironolactone . - Monitor potassium levels in 2-3 weeks at [facility].  Chest pain/angina pectoris with normal coronary arteries Intermittent chest pain with no significant episodes outside of normal occurrences, likely related to weight and capillary constriction, improved with weight loss. - Continue current management with metoprolol  and isosorbide  mononitrate. - If no further recurrence, could consider discontinuing Imdur  to reduce medications.  Hyperlipidemia LDL cholesterol is well controlled  with atorvastatin  80 mg daily. - Continue atorvastatin  80 mg daily.  Office visit as needed, if medication is effective, will request PCP to resume Rx.   Signed,  Gordy Bergamo, MD, [facility] 06/20/2024, 8:59 AM [facility] [address] [place], [place] [address] Phone: [phone]. Fax:  [phone]

## 2024-06-22 ENCOUNTER — Other Ambulatory Visit: Payer: Self-pay | Admitting: Family Medicine

## 2024-06-22 ENCOUNTER — Other Ambulatory Visit (HOSPITAL_COMMUNITY): Payer: Self-pay

## 2024-06-22 ENCOUNTER — Other Ambulatory Visit: Payer: Self-pay

## 2024-06-22 MED ORDER — GLIPIZIDE ER 5 MG PO TB24
5.0000 mg | ORAL_TABLET | ORAL | 1 refills | Status: DC
  Filled 2024-07-07: qty 30, 30d supply, fill #0
  Filled 2024-08-04: qty 30, 30d supply, fill #1
  Filled 2024-09-03 – 2024-09-16 (×4): qty 30, 30d supply, fill #2
  Filled 2024-10-12: qty 30, 30d supply, fill #3
  Filled 2024-11-05 – 2024-11-09 (×3): qty 30, 30d supply, fill #4
  Filled 2024-12-09: qty 30, 30d supply, fill #5

## 2024-06-23 ENCOUNTER — Other Ambulatory Visit: Payer: Self-pay

## 2024-06-25 ENCOUNTER — Other Ambulatory Visit: Payer: Self-pay

## 2024-06-26 ENCOUNTER — Encounter: Payer: Self-pay | Admitting: Adult Health

## 2024-06-26 ENCOUNTER — Ambulatory Visit: Admitting: Adult Health

## 2024-06-26 VITALS — BP 96/62 | HR 74 | Temp 98.2°F | Wt 210.0 lb

## 2024-06-26 DIAGNOSIS — S31030A Puncture wound without foreign body of lower back and pelvis without penetration into retroperitoneum, initial encounter: Secondary | ICD-10-CM

## 2024-06-26 DIAGNOSIS — T148XXA Other injury of unspecified body region, initial encounter: Secondary | ICD-10-CM

## 2024-06-26 DIAGNOSIS — Z23 Encounter for immunization: Secondary | ICD-10-CM | POA: Diagnosis not present

## 2024-06-26 MED ORDER — DOXYCYCLINE HYCLATE 100 MG PO CAPS: 100.0000 mg | ORAL_CAPSULE | Freq: Two times a day (BID) | ORAL | 0 refills | Status: DC

## 2024-06-26 NOTE — Progress Notes (Signed)
 Subjective:    Patient ID: Shelby Anderson, female    DOB: 1955-12-21, 68 y.o.   MRN: [id number]  HPI 68 year old female who  has a past medical history of Allergy , Anemia, Angina, Anxiety and depression, Arthritis, Asthma, Atrial fibrillation (HCC), Breast cancer (HCC), Cancer (HCC), CHF (congestive heart failure) (HCC), Chronic back pain greater than 3 months duration, Chronic respiratory failure (HCC) (09/15/2015), Colon polyps, Coronary artery disease, Difficult intubation, Dysrhythmia, Esophageal stricture, Family history of melanoma (]), Family history of pancreatic cancer, Fatty liver, Fatty liver, Fibroids, Fibromyalgia, GERD (gastroesophageal reflux disease), Headache(784.0), Heart murmur, Hiatal hernia, History of loop recorder, History of migraines, Hyperlipemia, Hypertension, Mental disorder, 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  She is a patient of Dr. Ozell who I am seeing today for an acute visit. She reports falling onto a board that had multiple rusty nail that went into her right lower back and scrapped up hr right wrist and right lower leg. Her fall was due to pulling a door off an old building that came off easier than she expected and she fell bacwards onto a board that was on the ground.   Her last TDAP was in July 2015    Review of Systems See HPI   Past Medical History:  Diagnosis Date   Allergy     Anemia    chronic   Angina     Prinzmetal angina   Anxiety and depression    Arthritis    Asthma    Atrial fibrillation (HCC)    h/o AF w/frequent PVCs   Breast cancer (HCC)    Left   Cancer (HCC)    hx of skin cancer    CHF (congestive heart failure) (HCC)    Chronic back pain greater than 3 months duration    on chronic narcotics, treated at pain clinic   Chronic respiratory failure (HCC) 09/15/2015   ONO 09/04/15 + desats >begin O2 at 2l/ m    Colon polyps    hyperplastic   Coronary artery disease    Arrythmia, orthostatic hypotension, HLD, HTN; sees Dr. Ladona   Difficult intubation    TMJ & woke up when they were still cutting on me   Dysrhythmia    sees Dr. Ladona and a cardiologist at [facility]   Esophageal stricture    Family history of melanoma ]   Family history of pancreatic cancer    Fatty liver    Fatty liver    Fibroids    Fibromyalgia    in my legs   GERD (gastroesophageal reflux disease)    hx hiatal hernia, stricture and gastric ulcer   Headache(784.0)    migraines   Heart murmur    Hiatal hernia    History of loop recorder    History of migraines    dx'd when I was in my teens   Hyperlipemia    Hypertension    Mental disorder    Mild episode of recurrent major depressive disorder (HCC) 12/06/2015   Myocardial infarction [facility]) 1980's & 1990;   sees Dr. Ladona   OSA (obstructive  sleep apnea)    Personal history of chemotherapy    Personal history of radiation therapy    Pneumonia    multiple times   PONV (postoperative nausea and vomiting)    Recurrent upper respiratory infection (URI)    S/P left TKA 09/25/2016   Shortness of breath 11/20/11   all the time, sees pulmonlogy, ? asthma   Stenotic cervical os    Stomach ulcer    3 small; found in 05/2011   TMJ (dislocation of temporomandibular joint)    Tuberculosis    + TB SKIN TEST   Type 2 diabetes mellitus without complication, without long-term current use of insulin  (HCC) 12/06/2015   not on meds      Social History   Socioeconomic History   Marital status: Married    Spouse name: Not on file   Number of children: 3   Years of education: 15   Highest education level: Some college, no degree  Occupational History   Occupation: Retired Charity fundraiser  Tobacco Use   Smoking status: Never   Smokeless tobacco: Never  Vaping Use   Vaping status: Never Used  Substance and Sexual Activity   Alcohol use: No    Alcohol/week: 0.0 standard drinks of alcohol   Drug use: No   Sexual activity: Not Currently    Birth control/protection: Surgical    Comment: 1st intercourse 68 yo-Fewer than 5 partners  Other Topics Concern   Not on file  Social History Narrative   Right handed   One story home   3 children   Has custody of 2 grandchildren ages 10 & 17    Social Drivers of Corporate investment banker Strain: Low Risk  (09/27/2023)   Overall Financial Resource Strain (CARDIA)    Difficulty of Paying Living Expenses: Not hard at all  Food Insecurity: No Food Insecurity (09/27/2023)   Hunger Vital Sign    Worried About Running Out of Food in the Last Year: Never true    Ran Out of Food in the Last Year: Never true  Transportation Needs: No Transportation Needs (09/27/2023)   PRAPARE - Administrator, Civil Service (Medical): No    Lack of Transportation (Non-Medical): No  Physical Activity: Sufficiently Active (09/27/2023)   Exercise Vital Sign    Days of Exercise per Week: 3 days    Minutes of Exercise per Session: 60 min  Stress: Stress Concern Present (09/27/2023)   [employer] of Occupational Health - Occupational Stress Questionnaire    Feeling of Stress : Very much  Social Connections: Socially Integrated (09/27/2023)   Social Connection and Isolation Panel    Frequency of Communication with Friends and Family: More than three times a week    Frequency of Social Gatherings with Friends and Family: More than three times a week    Attends Religious Services: More than  4 times per year    Active Member of [employer] or Organizations: Yes    Attends Banker Meetings: More than 4 times per year    Marital Status: Married  Catering manager Violence: Not At Risk (09/27/2023)   Humiliation, Afraid, Rape, and Kick questionnaire    Fear of Current or Ex-Partner: No    Emotionally Abused: No    Physically Abused: No    Sexually Abused: No    Past Surgical History:  Procedure Laterality Date   ACHILLES TENDON REPAIR  1970's   left ankle   ARTHROSCOPIC REPAIR ACL  left knee cap   BREAST BIOPSY Right 04/08/2013   again in October or November 2020   BREAST LUMPECTOMY Left 12/09/2019   BREAST LUMPECTOMY WITH RADIOACTIVE SEED AND SENTINEL LYMPH NODE BIOPSY Left 12/09/2019   Procedure: LEFT BREAST LUMPECTOMY WITH RADIOACTIVE SEED AND LEFT AXILLARY SENTINEL LYMPH NODE BIOPSY;  Surgeon: Ebbie Cough, MD;  Location: [facility] OR;  Service: General;  Laterality: Left;  PEC BLOCK   CARDIAC CATHETERIZATION     loop recorder   CARPAL TUNNEL RELEASE  unknown   left hand   COLONOSCOPY     ESOPHAGOGASTRODUODENOSCOPY (EGD) WITH PROPOFOL  N/A 10/29/2017   Procedure: ESOPHAGOGASTRODUODENOSCOPY (EGD) WITH PROPOFOL ;  Surgeon: Abran Norleen SAILOR, MD;  Location: [facility] ENDOSCOPY;  Service: Endoscopy;  Laterality: N/A;   LOOP RECORDER IMPLANT     PORT-A-CATH REMOVAL N/A 12/09/2019   Procedure: REMOVAL PORT-A-CATH;  Surgeon: Ebbie Cough, MD;  Location: [facility] OR;  Service: General;  Laterality: N/A;   PORTACATH PLACEMENT N/A 09/23/2019   Procedure: INSERTION PORT-A-CATH Right Internal Brooke RATER;  Surgeon: Ebbie Cough, MD;  Location: [facility] OR;  Service: General;  Laterality: N/A;   post ganglionectomy  1970's   for migraine headaches   pouch string  [phone]   did this 3 times (once w/each pregnancy)   SAVORY DILATION N/A 10/29/2017   Procedure: SAVORY DILATION;  Surgeon: Abran Norleen SAILOR, MD;  Location: [facility] ENDOSCOPY;  Service: Endoscopy;  Laterality:  N/A;   TOTAL KNEE ARTHROPLASTY Left 09/25/2016   Procedure: LEFT TOTAL KNEE ARTHROPLASTY;  Surgeon: Cough Car, MD;  Location: [facility] ORS;  Service: Orthopedics;  Laterality: Left;   TUBAL LIGATION  1980's    Family History  Problem Relation Age of Onset   Malignant hyperthermia Father    Hypertension Father    Heart disease Father    Diabetes Father    Cancer Father        skin   Hypertension Mother    Heart disease Mother    Multiple myeloma Mother    Cancer Sister        CERVICAL   Hypertension Sister    Cancer Brother 16       MELANOMA   Heart disease Maternal Grandmother    Other Maternal Grandmother 32       complications of childbirth   Heart disease Maternal Grandfather    Cancer Paternal Grandmother        ?    Heart disease Paternal Grandmother    Heart disease Paternal Grandfather    Cancer Brother        LUNG   Diabetes Sister    Hypertension Sister    Heart disease Sister    Cancer Sister    Cancer Brother    Pancreatic cancer Niece 63   Cancer Nephew 40       unknown- currently in the [employer]   Anesthesia problems Neg Hx    Hypotension Neg Hx    Pseudochol deficiency Neg Hx    Colon cancer Neg Hx    Esophageal cancer Neg Hx    Rectal cancer Neg Hx    Stomach cancer Neg Hx    Breast cancer Neg Hx     Allergies  Allergen Reactions   Lodine [Etodolac] Anaphylaxis, Hives and Swelling   Oxycontin  [Oxycodone  Hcl] Anaphylaxis    hives, trouble breathing, tongue swelling (Only Oxycontin ) Tolerates plain oxycodone .   Penicillins Anaphylaxis    Told by a surgeon never to take it again. Has patient had a PCN reaction causing  immediate rash, facial/tongue/throat swelling, SOB or lightheadedness with hypotension: Yes Has patient had a PCN reaction causing severe rash involving mucus membranes or skin necrosis: Unknown Has patient had a PCN reaction that required hospitalization: No Has patient had a PCN reaction occurring within the last 10 years: No If  all of the above answers are NO, then may proceed with Cephalosporin use.   Aspirin  Other (See Comments)    High-dose caused GI Bleeds   Darvocet [Propoxyphene N-Acetaminophen ] Hives   Nitroglycerin  Other (See Comments)    IV-BP drops dramatically Can take po   Propoxyphene Hives   Tramadol Hives and Itching   Valium Other (See Comments)    Circulation problems. Legs turned black.    Current Outpatient Medications on File Prior to Visit  Medication Sig Dispense Refill   albuterol  (PROVENTIL ) (2.5 MG/3ML) 0.083% nebulizer solution Inhale 3 mLs (2.5 mg total) by nebulization every 6 (six) hours as needed for wheezing or shortness of breath (J45.40). 75 mL 5   albuterol  (VENTOLIN  HFA) 108 (90 Base) MCG/ACT inhaler Inhale 1-2 puffs into the lungs every 4 (four) hours as needed for wheezing or shortness of breath. 6.7 g 3   atorvastatin  (LIPITOR) 80 MG tablet Take 1 tablet (80 mg total) by mouth at bedtime. 30 tablet 4   Blood Glucose Monitoring Suppl (ONE TOUCH ULTRA 2) w/Device KIT Use as directed 1 kit 1   budesonide -formoterol  (SYMBICORT ) 80-4.5 MCG/ACT inhaler Inhale 2 puffs into the lungs every morning and another 2 puffs 12 hours later. 30.6 g 3   buPROPion  (WELLBUTRIN  XL) 150 MG 24 hr tablet Take 1 tablet (150 mg total) by mouth daily. 30 tablet 3   Calcium  Carbonate-Vitamin D 600-200 MG-UNIT TABS Take 1 tablet by mouth daily.     Cholecalciferol (VITAMIN D3) 50 MCG (2000 UT) capsule Take 2,000 Units by mouth daily.     CINNAMON PO Take 1,000 mg 2 (two) times daily by mouth.     dapagliflozin  propanediol (FARXIGA ) 10 MG TABS tablet Take 1 tablet (10 mg total) by mouth daily before breakfast. 90 tablet 1   doxepin  (SINEQUAN ) 50 MG capsule Take 1 capsule (50 mg total) by mouth at bedtime. 90 capsule 1   DULoxetine  (CYMBALTA ) 60 MG capsule Take 1 capsule (60 mg total) by mouth daily. 30 capsule 3   famotidine  (PEPCID ) 20 MG tablet Take 1 tablet (20 mg total) by mouth 2 (two) times  daily. 180 tablet 3   fluticasone  (FLONASE ) 50 MCG/ACT nasal spray Place 1 spray into both nostrils at bedtime.     furosemide  (LASIX ) 20 MG tablet Take 1 tablet (20 mg total) by mouth daily as needed for ankle swelling. 180 tablet 1   glipiZIDE  (GLUCOTROL  XL) 5 MG 24 hr tablet Take 1 tablet (5 mg total) by mouth every morning. 90 tablet 1   hydrALAZINE  (APRESOLINE ) 25 MG tablet Take 1 tablet (25 mg total) by mouth daily as needed for blood pressure >140/69mmhg, up to three times a day 30 tablet 10   isosorbide  mononitrate (IMDUR ) 60 MG 24 hr tablet Take 1 tablet (60 mg total) by mouth every morning. 30 tablet 5   Lancets (ONETOUCH DELICA PLUS LANCET30G) MISC USE TO CHECK BLOOD SUGAR 2 TIMES A DAY 100 each 1   levothyroxine  (SYNTHROID ) 75 MCG tablet Take 1 tablet (75 mcg total) by mouth daily before breakfast. 30 tablet 4   lisinopril  (ZESTRIL ) 5 MG tablet Take 1 tablet (5 mg total) by mouth every morning. 30 tablet  3   LYRICA  150 MG capsule Take 150 mg by mouth 3 (three) times daily.   2   metFORMIN  (GLUCOPHAGE ) 500 MG tablet Take 1 tablet (500 mg total) by mouth 2 (two) times daily with a meal. 60 tablet 5   metoprolol  succinate (TOPROL -XL) 25 MG 24 hr tablet Take 1 tablet (25 mg total) by mouth daily. TAKE WITH OR IMMEDIATELY FOLLOWING A MEAL. 90 tablet 0   Misc Natural Products (GLUCOS-CHONDROIT-MSM COMPLEX PO) Take 2 tablets by mouth daily.     mometasone  (ELOCON ) 0.1 % cream Apply 1 Application topically daily as needed (for redness/raw). Apply topically behind ears     montelukast  (SINGULAIR ) 10 MG tablet Take 1 tablet (10 mg total) by mouth at bedtime. 90 tablet 3   Multiple Vitamin (MULITIVITAMIN WITH MINERALS) TABS Take 1 tablet by mouth daily.     NEXIUM  40 MG capsule Take 1 capsule (40 mg total) by mouth in the morning and at bedtime. 180 capsule 3   nitroGLYCERIN  (NITROSTAT ) 0.4 MG SL tablet Place 1 tablet under the tongue every 5 minutes as needed for chest pain up to 3 times. If no  relief, call 911. 25 tablet 4   NURTEC 75 MG TBDP Take 1 each by mouth daily as needed. (Patient taking differently: Take 75 mg by mouth daily as needed (for migraines).) 2 tablet 0   ondansetron  (ZOFRAN ) 4 MG tablet Take 1 tablet (4 mg total) by mouth every 8 (eight) hours as needed for nausea or vomiting. 80 tablet 2   ONETOUCH ULTRA test strip USE TO CHECK BLOOD SUGAR 2 TIMES A DAY 100 strip 3   ONETOUCH ULTRA TEST test strip USE AS DIRECTED TWICE A DAY 100 strip 3   oxyCODONE  (ROXICODONE ) 15 MG immediate release tablet Take 1 tablet by mouth four times a day (Patient taking differently: Take 15 mg by mouth every 8 (eight) hours as needed for pain.) 120 tablet 0   potassium chloride  (KLOR-CON ) 10 MEQ tablet Take 2 tablets by mouth daily as needed--take on days you take furosemide      pregabalin  (LYRICA ) 150 MG capsule Take 1 capsule (150 mg total) by mouth 3 (three) times daily. 90 capsule 2   promethazine  (PHENERGAN ) 25 MG tablet Take 1 tablet (25 mg total) by mouth at bedtime. 90 tablet 1   spironolactone  (ALDACTONE ) 25 MG tablet Take 1 tablet (25 mg total) by mouth every morning. 30 tablet 2   tiZANidine  (ZANAFLEX ) 4 MG tablet Take 2 tablets (8 mg total) by mouth 3 (three) times daily. 540 tablet 2   vitamin B-12 (CYANOCOBALAMIN ) 500 MCG tablet Take 500 mcg by mouth daily.      vitamin E 200 UNIT capsule Take 200 Units by mouth daily.     No current facility-administered medications on file prior to visit.    BP 96/62   Pulse 74   Temp 98.2 F (36.8 C) (Oral)   Wt 210 lb (95.3 kg)   SpO2 95%   BMI 33.89 kg/m       Objective:   Physical Exam Vitals and nursing note reviewed.  Constitutional:      Appearance: Normal appearance.  Skin:    General: Skin is warm and dry.      Neurological:     General: No focal deficit present.     Mental Status: She is alert and oriented to person, place, and time.  Psychiatric:        Mood and Affect: Mood normal.  Behavior: Behavior  normal.        Thought Content: Thought content normal.        Judgment: Judgment normal.        Assessment & Plan:  1. Puncture wound (Primary) - Tdap updated today. Will place on Doxycycline  as precautionary measure due to concern for puncture wound.  - Follow up as needed - doxycycline  (VIBRAMYCIN ) 100 MG capsule; Take 1 capsule (100 mg total) by mouth 2 (two) times daily.  Dispense: 14 capsule; Refill: 0 - Tdap vaccine greater than or equal to 7yo IM   Oveta Idris, NP

## 2024-07-06 ENCOUNTER — Other Ambulatory Visit (HOSPITAL_COMMUNITY): Payer: Self-pay

## 2024-07-07 ENCOUNTER — Other Ambulatory Visit: Payer: Self-pay

## 2024-07-07 ENCOUNTER — Other Ambulatory Visit (HOSPITAL_COMMUNITY): Payer: Self-pay

## 2024-07-07 ENCOUNTER — Other Ambulatory Visit: Payer: Self-pay | Admitting: Family Medicine

## 2024-07-07 MED ORDER — DOXEPIN HCL 50 MG PO CAPS
50.0000 mg | ORAL_CAPSULE | Freq: Every day | ORAL | 1 refills | Status: DC
  Filled 2024-07-07: qty 30, 30d supply, fill #0

## 2024-07-07 MED ORDER — POTASSIUM CHLORIDE ER 10 MEQ PO TBCR
20.0000 meq | EXTENDED_RELEASE_TABLET | Freq: Every morning | ORAL | 1 refills | Status: DC
  Filled 2024-07-07: qty 60, 30d supply, fill #0
  Filled 2024-08-04: qty 60, 30d supply, fill #1
  Filled 2024-09-03 – 2024-09-16 (×4): qty 60, 30d supply, fill #2
  Filled 2024-10-12: qty 60, 30d supply, fill #3
  Filled 2024-11-05 – 2024-11-09 (×3): qty 60, 30d supply, fill #4
  Filled 2024-12-09: qty 60, 30d supply, fill #5

## 2024-07-07 MED FILL — Levothyroxine Sodium Tab 75 MCG: ORAL | 30 days supply | Qty: 30 | Fill #2 | Status: AC

## 2024-07-08 ENCOUNTER — Other Ambulatory Visit: Payer: Self-pay

## 2024-07-09 ENCOUNTER — Other Ambulatory Visit: Payer: Self-pay

## 2024-07-09 ENCOUNTER — Other Ambulatory Visit (HOSPITAL_COMMUNITY): Payer: Self-pay

## 2024-07-09 NOTE — Patient Instructions (Addendum)
 Thank you for allowing the Complex Care Management team to participate in your care. It was great speaking with you!  Reminders: -Please attend your PCP appointment as scheduled on August 11, 2024  We will follow up on August 18, 2024 at 1230. Please do not hesitate to contact me if you require assistance prior to our next outreach.     Jackson Acron [facility] Population Health RN Care Manager Direct Dial: [phone]  Fax: [phone] Website: [email or web address]

## 2024-07-09 NOTE — Patient Outreach (Signed)
 Complex Care Management   Visit Note  07/09/2024  Name:  Shelby Anderson MRN: [id number] DOB: 17-Jan-1956  Situation: Referral received for Complex Care Management related to Asthma and Diabetes. I obtained verbal consent from Patient.  Visit completed with Shelby Anderson via telephone.  Background:   Past Medical History:  Diagnosis Date   Allergy     Anemia    chronic   Angina    Prinzmetal angina   Anxiety and depression    Arthritis    Asthma    Atrial fibrillation (HCC)    h/o AF w/frequent PVCs   Breast cancer (HCC)    Left   Cancer (HCC)    hx of skin cancer    CHF (congestive heart failure) (HCC)    Chronic back pain greater than 3 months duration    on chronic narcotics, treated at pain clinic   Chronic respiratory failure (HCC) 09/15/2015   ONO 09/04/15 + desats >begin O2 at 2l/ m    Colon polyps    hyperplastic   Coronary artery disease    Arrythmia, orthostatic hypotension, HLD, HTN; sees Dr. Ladona   Difficult intubation    TMJ & woke up when they were still cutting on me   Dysrhythmia    sees Dr. 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 asthma   Stenotic cervical os    Stomach ulcer    3 small; found in 05/2011   TMJ (dislocation of temporomandibular joint)    Tuberculosis    + TB SKIN TEST   Type 2 diabetes mellitus without complication, without long-term current use of insulin  (HCC) 12/06/2015   not on meds        Assessment: Patient Reported Symptoms: Cognitive Cognitive Status: No symptoms reported, Alert and oriented to person, place, and time, Normal speech and language skills Cognitive/Intellectual Conditions Management [RPT]: None reported or documented in medical history or problem list Health Maintenance Behaviors: Annual physical exam, Stress management, Healthy diet Healing Pattern: Average Health Facilitated by: Rest, Stress management, Healthy diet  Neurological Neurological Review of Symptoms: No symptoms reported Neurological Management Strategies: Routine screening Neurological Self-Management Outcome: 4 (good)  HEENT HEENT Symptoms Reported: No symptoms reported HEENT Management Strategies: Routine screening HEENT Self-Management Outcome: 4 (good)  Cardiovascular Cardiovascular Symptoms Reported: No symptoms reported Does patient have uncontrolled  Hypertension?: No Cardiovascular Management Strategies: Medication therapy, Medical device, Diet modification, Coping strategies, Routine screening, Weight management Do You Have a Working Readable Scale?: Yes Weight: 211 lb (95.7 kg) Cardiovascular Self-Management Outcome: 4 (good)  Respiratory Respiratory Symptoms Reported: No symptoms reported Respiratory Management Strategies: Coping strategies, Routine screening, Spacer/mask, Medication therapy Respiratory Self-Management Outcome: 4 (good)  Endocrine Endocrine Symptoms Reported: No symptoms reported Is patient diabetic?: Yes Is patient checking blood sugars at home?: Yes List most  recent blood sugar readings, include date and time of day: Reports fasting readings have ranged from 94 -117  Gastrointestinal Gastrointestinal Symptoms Reported: No symptoms reported Gastrointestinal Management Strategies: Coping strategies, Diet modification, Medication therapy Gastrointestinal Self-Management Outcome: 4 (good)  Genitourinary Genitourinary Symptoms Reported: No symptoms reported Genitourinary Management Strategies: Coping strategies, Medical device Genitourinary Self-Management Outcome: 4 (good)  Integumentary Integumentary Symptoms Reported: Wound Additional Integumentary Details: Reports scabbed wound to back secondary to falling on nails. Reports taking antibiotics as prescribed. Denies current signs or symptoms of infection since taking antibiotics. Skin Management Strategies: Coping strategies, Medication therapy, Routine screening Skin Self-Management Outcome: 4 (good)  Musculoskeletal Musculoskelatal Symptoms Reviewed: No symptoms reported Musculoskeletal Management Strategies: Medication therapy, Routine screening Musculoskeletal Self-Management Outcome: 4 (good) Falls in the past year?: Yes Number of falls in past year: 1 or less Was there an injury with Fall?: No (Did not require emergent evaluation. Required updated vaccine and antibiotics due to falling on nails.) Fall Risk Category Calculator: 1 Patient Fall Risk Level: Low Fall Risk Patient at Risk for Falls Due to: Medication side effect, Other (Comment) (History of spasms.)  Psychosocial Psychosocial Symptoms Reported: No symptoms reported Behavioral Management Strategies: Support system, Coping strategies, Medication therapy Behavioral Health Self-Management Outcome: 4 (good) Major Change/Loss/Stressor/Fears (CP): Denies Behaviors When Feeling Stressed/Fearful: Spend time with family, Rest Techniques to [place] with Loss/Stress/Change: Medication Quality of Family Relationships: supportive Do you feel  physically threatened by others?: No      07/09/2024    1:01 PM  Depression screen PHQ 2/9  Decreased Interest 0  Down, Depressed, Hopeless 0  PHQ - 2 Score 0    There were no vitals filed for this visit.  Medications Reviewed Today     Reviewed by Karoline Lima, RN (Registered Nurse) on 07/09/24 at 2311  Med List Status: <None>   Medication Order Taking? Sig Documenting Provider Last Dose Status Informant  albuterol  (PROVENTIL ) (2.5 MG/3ML) 0.083% nebulizer solution [id number]  Inhale 3 mLs (2.5 mg total) by nebulization every 6 (six) hours as needed for wheezing or shortness of breath (J45.40). Parrett, Madelin RAMAN, NP  Active Self  albuterol  (VENTOLIN  HFA) 108 (90 Base) MCG/ACT inhaler [id number]  Inhale 1-2 puffs into the lungs every 4 (four) hours as needed for wheezing or shortness of breath. Parrett, Madelin RAMAN, NP  Active Self  atorvastatin  (LIPITOR) 80 MG tablet [id number]  Take 1 tablet (80 mg total) by mouth at bedtime. Ozell Heron HERO, MD  Active   Blood Glucose Monitoring Suppl (ONE TOUCH ULTRA 2) w/Device PRESSLEY [id number]  Use as directed Koberlein, Junell C, MD  Active Self  budesonide -formoterol  (SYMBICORT ) 80-4.5 MCG/ACT inhaler [id number]  Inhale 2 puffs into the lungs every morning and another 2 puffs 12 hours later. Parrett, Madelin RAMAN, NP  Active Self  buPROPion  (WELLBUTRIN  XL) 150 MG 24 hr tablet [id number]  Take 1 tablet (150 mg total) by mouth daily. Ozell Heron HERO, MD  Active   Calcium  Carbonate-Vitamin D 600-200 MG-UNIT TABS [id number]  Take 1 tablet by mouth  daily. [provider]  Active Self  Cholecalciferol (VITAMIN D3) 50 MCG (2000 UT) capsule [id number]  Take 2,000 Units by mouth daily. [provider]  Active Self  CINNAMON PO [id number]  Take 1,000 mg 2 (two) times daily by mouth. [provider]  Active Self  dapagliflozin  propanediol (FARXIGA ) 10 MG TABS tablet [id number]  Take 1 tablet (10 mg total) by mouth daily before breakfast. Ozell Heron HERO, MD  Active   doxepin  (SINEQUAN ) 50 MG capsule [id number]  Take 1 capsule (50 mg total) by mouth at bedtime. Ozell Heron HERO, MD  Active   doxycycline  (VIBRAMYCIN ) 100 MG capsule [id number]  Take 1 capsule (100 mg total) by mouth 2 (two) times daily. Nafziger, Darleene, NP  Active   DULoxetine  (CYMBALTA ) 60 MG capsule [id number]  Take 1 capsule (60 mg total) by mouth daily. Ozell Heron HERO, MD  Active   famotidine  (PEPCID ) 20 MG tablet [id number]  Take 1 tablet (20 mg total) by mouth 2 (two) times daily. [place], PA-C  Active   fluticasone  (FLONASE ) 50 MCG/ACT nasal spray [id number]  Place 1 spray into both nostrils at bedtime. [provider]  Active Self  furosemide  (LASIX ) 20 MG tablet [id number]  Take 1 tablet (20 mg total) by mouth daily as needed for ankle swelling. Ozell Heron HERO, MD  Active   glipiZIDE  (GLUCOTROL  XL) 5 MG 24 hr tablet [id number]  Take 1 tablet (5 mg total) by mouth every morning. Ozell Heron HERO, MD  Active   hydrALAZINE  (APRESOLINE ) 25 MG tablet [id number]  Take 1 tablet (25 mg total) by mouth daily as needed for blood pressure >140/94mmhg, up to three times a day   Active Self  isosorbide  mononitrate (IMDUR ) 60 MG 24 hr tablet [id number]  Take 1 tablet (60 mg total) by mouth every morning. Ozell Heron HERO, MD  Active   Lancets [facility] DELICA PLUS [place]) [place] [id number]  USE TO CHECK BLOOD SUGAR 2 TIMES A DAY Koberlein, Junell C, MD  Active Self  levothyroxine  (SYNTHROID ) 75 MCG tablet [id number]  Take 1 tablet (75 mcg total) by mouth daily before breakfast. Ozell Heron HERO, MD  Active   lisinopril  (ZESTRIL ) 5 MG tablet [id number]  Take 1 tablet (5 mg total) by mouth every morning. Ozell Heron HERO, MD  Active   LYRICA  150 MG capsule [id number]  Take 150 mg by mouth 3 (three) times daily.  [provider]  Active Self  metFORMIN  (GLUCOPHAGE ) 500 MG tablet [id number]  Take 1 tablet (500 mg total) by mouth 2 (two) times daily with a  meal. Ozell Heron HERO, MD  Active   metoprolol  succinate (TOPROL -XL) 25 MG 24 hr tablet [id number]  Take 1 tablet (25 mg total) by mouth daily. TAKE WITH OR IMMEDIATELY FOLLOWING A MEAL. Ladona Heinz, MD  Active   Misc Natural Products (GLUCOS-CHONDROIT-MSM COMPLEX PO) [id number]  Take 2 tablets by mouth daily. [provider]  Active Self  mometasone  (ELOCON ) 0.1 % cream [id number]  Apply 1 Application topically daily as needed (for redness/raw). Apply topically behind ears [provider]  Active Self  montelukast  (SINGULAIR ) 10 MG tablet [id number]  Take 1 tablet (10 mg total) by mouth at bedtime. Parrett, Madelin RAMAN, NP  Active Self  Multiple Vitamin (MULITIVITAMIN WITH MINERALS) TABS [id number]  Take 1 tablet by mouth daily. [provider]  Active Self  NEXIUM  40 MG capsule [id number]  Take 1 capsule (40 mg total) by mouth in the morning and at bedtime. [place],  Ellouise, PA-C  Active   nitroGLYCERIN  (NITROSTAT ) 0.4 MG SL tablet [id number]  Place 1 tablet under the tongue every 5 minutes as needed for chest pain up to 3 times. If no relief, call 911. Ladona Heinz, MD  Active Self  NURTEC 75 MG TBDP [id number]  Take 1 each by mouth daily as needed.  Patient taking differently: Take 75 mg by mouth daily as needed (for migraines).   Ozell Heron HERO, MD  Active Self  ondansetron  (ZOFRAN ) 4 MG tablet [id number]  Take 1 tablet (4 mg total) by mouth every 8 (eight) hours as needed for nausea or vomiting. Abran Norleen SAILOR, MD  Active Self  [facility] ULTRA test strip [id number]  USE TO CHECK BLOOD SUGAR 2 TIMES A DAY Koberlein, Junell C, MD  Active Self  ONETOUCH ULTRA TEST test strip [id number]  USE AS DIRECTED TWICE A DAY Ozell Heron HERO, MD  Active   oxyCODONE  (ROXICODONE ) 15 MG immediate release tablet [id number]  Take 1 tablet by mouth four times a day  Patient taking differently: Take 15 mg by mouth every 8 (eight) hours as needed for pain.     Active Self  potassium chloride  (KLOR-CON )  10 MEQ tablet [id number]  Take 2 tablets by mouth daily as needed--take on days you take furosemide  Ladona Heinz, MD  Active   potassium chloride  (KLOR-CON ) 10 MEQ tablet [id number]  Take 2 tablets (20 mEq total) by mouth every morning. Ozell Heron HERO, MD  Active   pregabalin  (LYRICA ) 150 MG capsule [id number]  Take 1 capsule (150 mg total) by mouth 3 (three) times daily.   Active   promethazine  (PHENERGAN ) 25 MG tablet [id number]  Take 1 tablet (25 mg total) by mouth at bedtime. Abran Norleen SAILOR, MD  Active   spironolactone  (ALDACTONE ) 25 MG tablet [id number]  Take 1 tablet (25 mg total) by mouth every morning. Ladona Heinz, MD  Active   tiZANidine  (ZANAFLEX ) 4 MG tablet [id number]  Take 2 tablets (8 mg total) by mouth 3 (three) times daily.   Active   vitamin B-12 (CYANOCOBALAMIN ) 500 MCG tablet [id number]  Take 500 mcg by mouth daily.  [provider]  Active Self  vitamin E 200 UNIT capsule [id number]  Take 200 Units by mouth daily. [provider]  Active Self            Recommendation:   PCP Follow-up August 11, 2024  Follow Up Plan:   Telephone follow up appointment with Nurse Case Manager on  August 18, 2024   Jackson Acron [facility] RN Care Manager Direct Dial: [phone]  Fax: [phone] Website: [email or web address]

## 2024-07-13 ENCOUNTER — Other Ambulatory Visit: Payer: Self-pay | Admitting: Family Medicine

## 2024-07-13 ENCOUNTER — Other Ambulatory Visit (HOSPITAL_COMMUNITY): Payer: Self-pay

## 2024-07-13 ENCOUNTER — Other Ambulatory Visit: Payer: Self-pay

## 2024-07-13 DIAGNOSIS — Z79891 Long term (current) use of opiate analgesic: Secondary | ICD-10-CM | POA: Diagnosis not present

## 2024-07-13 DIAGNOSIS — G4700 Insomnia, unspecified: Secondary | ICD-10-CM | POA: Diagnosis not present

## 2024-07-13 DIAGNOSIS — M47816 Spondylosis without myelopathy or radiculopathy, lumbar region: Secondary | ICD-10-CM | POA: Diagnosis not present

## 2024-07-13 DIAGNOSIS — G894 Chronic pain syndrome: Secondary | ICD-10-CM | POA: Diagnosis not present

## 2024-07-13 MED ORDER — DULOXETINE HCL 30 MG PO CPEP
30.0000 mg | ORAL_CAPSULE | Freq: Every day | ORAL | 2 refills | Status: DC
  Filled 2024-07-13: qty 30, 30d supply, fill #0
  Filled 2024-08-04: qty 30, 30d supply, fill #1

## 2024-07-13 MED ORDER — DOXEPIN HCL 50 MG PO CAPS
50.0000 mg | ORAL_CAPSULE | Freq: Every day | ORAL | 1 refills | Status: DC
  Filled 2024-07-13: qty 90, 90d supply, fill #0
  Filled 2024-08-04: qty 30, 30d supply, fill #0

## 2024-07-14 ENCOUNTER — Other Ambulatory Visit (HOSPITAL_COMMUNITY): Payer: Self-pay

## 2024-07-16 ENCOUNTER — Other Ambulatory Visit (HOSPITAL_COMMUNITY): Payer: Self-pay

## 2024-07-27 ENCOUNTER — Other Ambulatory Visit: Payer: Self-pay | Admitting: Cardiology

## 2024-07-27 ENCOUNTER — Other Ambulatory Visit (HOSPITAL_COMMUNITY): Payer: Self-pay

## 2024-07-27 ENCOUNTER — Other Ambulatory Visit: Payer: Self-pay

## 2024-07-27 ENCOUNTER — Other Ambulatory Visit: Payer: Self-pay | Admitting: Family Medicine

## 2024-07-27 MED ORDER — ISOSORBIDE MONONITRATE ER 60 MG PO TB24
60.0000 mg | ORAL_TABLET | Freq: Every morning | ORAL | 5 refills | Status: DC
  Filled 2024-08-04: qty 30, 30d supply, fill #0
  Filled 2024-09-03 – 2024-09-16 (×4): qty 30, 30d supply, fill #1

## 2024-07-28 ENCOUNTER — Other Ambulatory Visit (HOSPITAL_COMMUNITY): Payer: Self-pay

## 2024-07-28 MED ORDER — METOPROLOL SUCCINATE ER 25 MG PO TB24
25.0000 mg | ORAL_TABLET | Freq: Every day | ORAL | 3 refills | Status: DC
Start: 2024-07-28 — End: 2024-08-08
  Filled 2024-08-04: qty 30, 30d supply, fill #0

## 2024-07-29 ENCOUNTER — Other Ambulatory Visit: Payer: Self-pay

## 2024-08-02 ENCOUNTER — Inpatient Hospital Stay (HOSPITAL_COMMUNITY)
Admission: EM | Admit: 2024-08-02 | Discharge: 2024-08-08 | DRG: 071 | Disposition: A | Discharge status: Home Health | Attending: Internal Medicine | Admitting: Internal Medicine

## 2024-08-02 ENCOUNTER — Emergency Department (HOSPITAL_COMMUNITY)

## 2024-08-02 ENCOUNTER — Other Ambulatory Visit: Payer: Self-pay

## 2024-08-02 DIAGNOSIS — F1110 Opioid abuse, uncomplicated: Secondary | ICD-10-CM | POA: Diagnosis present

## 2024-08-02 DIAGNOSIS — R001 Bradycardia, unspecified: Secondary | ICD-10-CM | POA: Diagnosis present

## 2024-08-02 DIAGNOSIS — I4891 Unspecified atrial fibrillation: Secondary | ICD-10-CM | POA: Diagnosis present

## 2024-08-02 DIAGNOSIS — Z9221 Personal history of antineoplastic chemotherapy: Secondary | ICD-10-CM

## 2024-08-02 DIAGNOSIS — Z87892 Personal history of anaphylaxis: Secondary | ICD-10-CM

## 2024-08-02 DIAGNOSIS — R4182 Altered mental status, unspecified: Principal | ICD-10-CM

## 2024-08-02 DIAGNOSIS — E039 Hypothyroidism, unspecified: Secondary | ICD-10-CM | POA: Diagnosis not present

## 2024-08-02 DIAGNOSIS — Z880 Allergy status to penicillin: Secondary | ICD-10-CM

## 2024-08-02 DIAGNOSIS — Z85828 Personal history of other malignant neoplasm of skin: Secondary | ICD-10-CM

## 2024-08-02 DIAGNOSIS — K760 Fatty (change of) liver, not elsewhere classified: Secondary | ICD-10-CM | POA: Diagnosis not present

## 2024-08-02 DIAGNOSIS — R296 Repeated falls: Secondary | ICD-10-CM | POA: Diagnosis present

## 2024-08-02 DIAGNOSIS — Z8701 Personal history of pneumonia (recurrent): Secondary | ICD-10-CM

## 2024-08-02 DIAGNOSIS — Z8249 Family history of ischemic heart disease and other diseases of the circulatory system: Secondary | ICD-10-CM

## 2024-08-02 DIAGNOSIS — G9340 Encephalopathy, unspecified: Secondary | ICD-10-CM | POA: Diagnosis present

## 2024-08-02 DIAGNOSIS — Z808 Family history of malignant neoplasm of other organs or systems: Secondary | ICD-10-CM

## 2024-08-02 DIAGNOSIS — G9341 Metabolic encephalopathy: Secondary | ICD-10-CM | POA: Diagnosis not present

## 2024-08-02 DIAGNOSIS — N390 Urinary tract infection, site not specified: Secondary | ICD-10-CM

## 2024-08-02 DIAGNOSIS — I110 Hypertensive heart disease with heart failure: Secondary | ICD-10-CM | POA: Diagnosis not present

## 2024-08-02 DIAGNOSIS — Z860100 Personal history of colon polyps, unspecified: Secondary | ICD-10-CM

## 2024-08-02 DIAGNOSIS — I509 Heart failure, unspecified: Secondary | ICD-10-CM | POA: Diagnosis not present

## 2024-08-02 DIAGNOSIS — R54 Age-related physical debility: Secondary | ICD-10-CM | POA: Diagnosis present

## 2024-08-02 DIAGNOSIS — Z853 Personal history of malignant neoplasm of breast: Secondary | ICD-10-CM

## 2024-08-02 DIAGNOSIS — K5903 Drug induced constipation: Secondary | ICD-10-CM | POA: Diagnosis not present

## 2024-08-02 DIAGNOSIS — F0154 Vascular dementia, unspecified severity, with anxiety: Secondary | ICD-10-CM | POA: Diagnosis present

## 2024-08-02 DIAGNOSIS — E119 Type 2 diabetes mellitus without complications: Secondary | ICD-10-CM | POA: Diagnosis present

## 2024-08-02 DIAGNOSIS — J45909 Unspecified asthma, uncomplicated: Secondary | ICD-10-CM | POA: Diagnosis not present

## 2024-08-02 DIAGNOSIS — F32A Depression, unspecified: Secondary | ICD-10-CM | POA: Diagnosis present

## 2024-08-02 DIAGNOSIS — Z79890 Hormone replacement therapy (postmenopausal): Secondary | ICD-10-CM

## 2024-08-02 DIAGNOSIS — I252 Old myocardial infarction: Secondary | ICD-10-CM

## 2024-08-02 DIAGNOSIS — I2510 Atherosclerotic heart disease of native coronary artery without angina pectoris: Secondary | ICD-10-CM | POA: Diagnosis not present

## 2024-08-02 DIAGNOSIS — R8281 Pyuria: Secondary | ICD-10-CM | POA: Diagnosis present

## 2024-08-02 DIAGNOSIS — Z886 Allergy status to analgesic agent status: Secondary | ICD-10-CM

## 2024-08-02 DIAGNOSIS — Z833 Family history of diabetes mellitus: Secondary | ICD-10-CM

## 2024-08-02 DIAGNOSIS — G8929 Other chronic pain: Secondary | ICD-10-CM | POA: Diagnosis not present

## 2024-08-02 DIAGNOSIS — K4020 Bilateral inguinal hernia, without obstruction or gangrene, not specified as recurrent: Secondary | ICD-10-CM | POA: Diagnosis not present

## 2024-08-02 DIAGNOSIS — I429 Cardiomyopathy, unspecified: Secondary | ICD-10-CM | POA: Diagnosis present

## 2024-08-02 DIAGNOSIS — J9611 Chronic respiratory failure with hypoxia: Secondary | ICD-10-CM | POA: Diagnosis not present

## 2024-08-02 DIAGNOSIS — Z6833 Body mass index (BMI) 33.0-33.9, adult: Secondary | ICD-10-CM

## 2024-08-02 DIAGNOSIS — M797 Fibromyalgia: Secondary | ICD-10-CM | POA: Diagnosis present

## 2024-08-02 DIAGNOSIS — E785 Hyperlipidemia, unspecified: Secondary | ICD-10-CM | POA: Diagnosis present

## 2024-08-02 DIAGNOSIS — T402X5A Adverse effect of other opioids, initial encounter: Secondary | ICD-10-CM | POA: Diagnosis present

## 2024-08-02 DIAGNOSIS — I10 Essential (primary) hypertension: Secondary | ICD-10-CM | POA: Diagnosis not present

## 2024-08-02 DIAGNOSIS — G4733 Obstructive sleep apnea (adult) (pediatric): Secondary | ICD-10-CM | POA: Diagnosis present

## 2024-08-02 DIAGNOSIS — Z7951 Long term (current) use of inhaled steroids: Secondary | ICD-10-CM

## 2024-08-02 DIAGNOSIS — Z923 Personal history of irradiation: Secondary | ICD-10-CM

## 2024-08-02 DIAGNOSIS — K449 Diaphragmatic hernia without obstruction or gangrene: Secondary | ICD-10-CM | POA: Diagnosis not present

## 2024-08-02 DIAGNOSIS — R109 Unspecified abdominal pain: Secondary | ICD-10-CM | POA: Diagnosis not present

## 2024-08-02 DIAGNOSIS — R112 Nausea with vomiting, unspecified: Secondary | ICD-10-CM | POA: Diagnosis present

## 2024-08-02 DIAGNOSIS — Z800 Family history of malignant neoplasm of digestive organs: Secondary | ICD-10-CM

## 2024-08-02 DIAGNOSIS — E876 Hypokalemia: Secondary | ICD-10-CM | POA: Diagnosis not present

## 2024-08-02 DIAGNOSIS — R0600 Dyspnea, unspecified: Secondary | ICD-10-CM | POA: Diagnosis not present

## 2024-08-02 DIAGNOSIS — K219 Gastro-esophageal reflux disease without esophagitis: Secondary | ICD-10-CM | POA: Diagnosis present

## 2024-08-02 DIAGNOSIS — Z7984 Long term (current) use of oral hypoglycemic drugs: Secondary | ICD-10-CM

## 2024-08-02 DIAGNOSIS — I6782 Cerebral ischemia: Secondary | ICD-10-CM | POA: Diagnosis not present

## 2024-08-02 DIAGNOSIS — Z8711 Personal history of peptic ulcer disease: Secondary | ICD-10-CM

## 2024-08-02 DIAGNOSIS — Z807 Family history of other malignant neoplasms of lymphoid, hematopoietic and related tissues: Secondary | ICD-10-CM

## 2024-08-02 DIAGNOSIS — Z96652 Presence of left artificial knee joint: Secondary | ICD-10-CM | POA: Diagnosis present

## 2024-08-02 DIAGNOSIS — F0153 Vascular dementia, unspecified severity, with mood disturbance: Secondary | ICD-10-CM | POA: Diagnosis present

## 2024-08-02 DIAGNOSIS — E669 Obesity, unspecified: Secondary | ICD-10-CM | POA: Diagnosis present

## 2024-08-02 DIAGNOSIS — Z79899 Other long term (current) drug therapy: Secondary | ICD-10-CM

## 2024-08-02 DIAGNOSIS — Z888 Allergy status to other drugs, medicaments and biological substances status: Secondary | ICD-10-CM

## 2024-08-02 DIAGNOSIS — Z885 Allergy status to narcotic agent status: Secondary | ICD-10-CM

## 2024-08-02 DIAGNOSIS — Z7409 Other reduced mobility: Secondary | ICD-10-CM | POA: Diagnosis present

## 2024-08-02 DIAGNOSIS — F331 Major depressive disorder, recurrent, moderate: Secondary | ICD-10-CM

## 2024-08-02 LAB — BASIC METABOLIC PANEL WITH GFR
Anion gap: 17 — ABNORMAL HIGH (ref 5–15)
BUN: 18 mg/dL (ref 8–23)
CO2: 19 mmol/L — ABNORMAL LOW (ref 22–32)
Calcium: 10.1 mg/dL (ref 8.9–10.3)
Chloride: 107 mmol/L (ref 98–111)
Creatinine, Ser: 1.12 mg/dL — ABNORMAL HIGH (ref 0.44–1.00)
GFR, Estimated: 54 mL/min — ABNORMAL LOW (ref >60)
Glucose, Bld: 184 mg/dL — ABNORMAL HIGH (ref 70–99)
Potassium: 3.8 mmol/L (ref 3.5–5.1)
Sodium: 143 mmol/L (ref 135–145)

## 2024-08-02 LAB — PROTIME-INR
INR: 1 (ref 0.8–1.2)
Prothrombin Time: 13.6 s (ref 11.4–15.2)

## 2024-08-02 LAB — CBC
HCT: 44.2 % (ref 36.0–46.0)
Hemoglobin: 14.6 g/dL (ref 12.0–15.0)
MCH: 28.3 pg (ref 26.0–34.0)
MCHC: 33 g/dL (ref 30.0–36.0)
MCV: 85.7 fL (ref 80.0–100.0)
Platelets: 282 K/uL (ref 150–400)
RBC: 5.16 MIL/uL — ABNORMAL HIGH (ref 3.87–5.11)
RDW: 15.4 % (ref 11.5–15.5)
WBC: 7 K/uL (ref 4.0–10.5)
nRBC: 0 % (ref 0.0–0.2)

## 2024-08-02 LAB — I-STAT VENOUS BLOOD GAS, ED
Acid-Base Excess: 4 mmol/L — ABNORMAL HIGH (ref 0.0–2.0)
Bicarbonate: 26.7 mmol/L (ref 20.0–28.0)
Calcium, Ion: 1.18 mmol/L (ref 1.15–1.40)
HCT: 38 % (ref 36.0–46.0)
Hemoglobin: 12.9 g/dL (ref 12.0–15.0)
O2 Saturation: 86 %
Potassium: 3.5 mmol/L (ref 3.5–5.1)
Sodium: 141 mmol/L (ref 135–145)
TCO2: 28 mmol/L (ref 22–32)
pCO2, Ven: 33.2 mmHg — ABNORMAL LOW (ref 44–60)
pH, Ven: 7.514 — ABNORMAL HIGH (ref 7.25–7.43)
pO2, Ven: 46 mmHg — ABNORMAL HIGH (ref 32–45)

## 2024-08-02 LAB — TROPONIN I (HIGH SENSITIVITY)
Troponin I (High Sensitivity): 11 ng/L (ref <18)
Troponin I (High Sensitivity): 10 ng/L (ref <18)

## 2024-08-02 LAB — BRAIN NATRIURETIC PEPTIDE: B Natriuretic Peptide: 138.1 pg/mL — ABNORMAL HIGH (ref 0.0–100.0)

## 2024-08-02 MED ORDER — IOHEXOL 350 MG/ML SOLN
75.0000 mL | Freq: Once | INTRAVENOUS | Status: AC | PRN
  Administered 2024-08-02: 75 mL via INTRAVENOUS

## 2024-08-02 MED ORDER — ONDANSETRON 4 MG PO TBDP
4.0000 mg | ORAL_TABLET | Freq: Once | ORAL | Status: AC
  Administered 2024-08-02: 4 mg via ORAL
  Filled 2024-08-02: qty 1

## 2024-08-02 NOTE — ED Triage Notes (Signed)
 Patient's family stated emesis started yesterday with fatigue and generalized weakness.

## 2024-08-02 NOTE — ED Notes (Signed)
 Patient transported to X-ray

## 2024-08-02 NOTE — ED Triage Notes (Signed)
 Family reported patient 's persistent emesis with mild SOB .

## 2024-08-03 ENCOUNTER — Emergency Department (HOSPITAL_COMMUNITY)

## 2024-08-03 ENCOUNTER — Encounter (HOSPITAL_COMMUNITY): Payer: Self-pay | Admitting: Radiology

## 2024-08-03 ENCOUNTER — Inpatient Hospital Stay (HOSPITAL_COMMUNITY)

## 2024-08-03 DIAGNOSIS — G4733 Obstructive sleep apnea (adult) (pediatric): Secondary | ICD-10-CM | POA: Diagnosis present

## 2024-08-03 DIAGNOSIS — G9340 Encephalopathy, unspecified: Secondary | ICD-10-CM | POA: Diagnosis present

## 2024-08-03 DIAGNOSIS — Z6833 Body mass index (BMI) 33.0-33.9, adult: Secondary | ICD-10-CM | POA: Diagnosis not present

## 2024-08-03 DIAGNOSIS — F0154 Vascular dementia, unspecified severity, with anxiety: Secondary | ICD-10-CM | POA: Diagnosis present

## 2024-08-03 DIAGNOSIS — K760 Fatty (change of) liver, not elsewhere classified: Secondary | ICD-10-CM | POA: Diagnosis present

## 2024-08-03 DIAGNOSIS — I110 Hypertensive heart disease with heart failure: Secondary | ICD-10-CM | POA: Diagnosis present

## 2024-08-03 DIAGNOSIS — I509 Heart failure, unspecified: Secondary | ICD-10-CM | POA: Diagnosis present

## 2024-08-03 DIAGNOSIS — J45909 Unspecified asthma, uncomplicated: Secondary | ICD-10-CM | POA: Diagnosis present

## 2024-08-03 DIAGNOSIS — E785 Hyperlipidemia, unspecified: Secondary | ICD-10-CM | POA: Diagnosis present

## 2024-08-03 DIAGNOSIS — J9611 Chronic respiratory failure with hypoxia: Secondary | ICD-10-CM | POA: Diagnosis present

## 2024-08-03 DIAGNOSIS — I4891 Unspecified atrial fibrillation: Secondary | ICD-10-CM | POA: Diagnosis present

## 2024-08-03 DIAGNOSIS — I429 Cardiomyopathy, unspecified: Secondary | ICD-10-CM | POA: Diagnosis present

## 2024-08-03 DIAGNOSIS — G8929 Other chronic pain: Secondary | ICD-10-CM | POA: Diagnosis present

## 2024-08-03 DIAGNOSIS — F0153 Vascular dementia, unspecified severity, with mood disturbance: Secondary | ICD-10-CM | POA: Diagnosis present

## 2024-08-03 DIAGNOSIS — E876 Hypokalemia: Secondary | ICD-10-CM | POA: Diagnosis not present

## 2024-08-03 DIAGNOSIS — I2510 Atherosclerotic heart disease of native coronary artery without angina pectoris: Secondary | ICD-10-CM | POA: Diagnosis present

## 2024-08-03 DIAGNOSIS — G9341 Metabolic encephalopathy: Secondary | ICD-10-CM | POA: Diagnosis present

## 2024-08-03 DIAGNOSIS — F1110 Opioid abuse, uncomplicated: Secondary | ICD-10-CM | POA: Diagnosis present

## 2024-08-03 DIAGNOSIS — K5903 Drug induced constipation: Secondary | ICD-10-CM | POA: Diagnosis present

## 2024-08-03 DIAGNOSIS — I6782 Cerebral ischemia: Secondary | ICD-10-CM | POA: Diagnosis not present

## 2024-08-03 DIAGNOSIS — F32A Depression, unspecified: Secondary | ICD-10-CM | POA: Diagnosis present

## 2024-08-03 DIAGNOSIS — K219 Gastro-esophageal reflux disease without esophagitis: Secondary | ICD-10-CM | POA: Diagnosis present

## 2024-08-03 DIAGNOSIS — R296 Repeated falls: Secondary | ICD-10-CM | POA: Diagnosis present

## 2024-08-03 DIAGNOSIS — E119 Type 2 diabetes mellitus without complications: Secondary | ICD-10-CM | POA: Diagnosis present

## 2024-08-03 DIAGNOSIS — E039 Hypothyroidism, unspecified: Secondary | ICD-10-CM | POA: Diagnosis present

## 2024-08-03 LAB — HIV ANTIBODY (ROUTINE TESTING W REFLEX): HIV Screen 4th Generation wRfx: NONREACTIVE

## 2024-08-03 LAB — URINALYSIS, ROUTINE W REFLEX MICROSCOPIC
Bacteria, UA: NONE SEEN
Bilirubin Urine: NEGATIVE
Glucose, UA: >=500 mg/dL — AB
Hgb urine dipstick: NEGATIVE
Ketones, ur: 20 mg/dL — AB
Nitrite: NEGATIVE
Protein, ur: 30 mg/dL — AB
Specific Gravity, Urine: 1.04 — ABNORMAL HIGH (ref 1.005–1.030)
pH: 7 (ref 5.0–8.0)

## 2024-08-03 LAB — GLUCOSE, CAPILLARY
Glucose-Capillary: 143 mg/dL — ABNORMAL HIGH (ref 70–99)
Glucose-Capillary: 126 mg/dL — ABNORMAL HIGH (ref 70–99)
Glucose-Capillary: 146 mg/dL — ABNORMAL HIGH (ref 70–99)
Glucose-Capillary: 150 mg/dL — ABNORMAL HIGH (ref 70–99)

## 2024-08-03 LAB — RAPID URINE DRUG SCREEN, HOSP PERFORMED
Amphetamines: NOT DETECTED
Barbiturates: NOT DETECTED
Benzodiazepines: NOT DETECTED
Cocaine: NOT DETECTED
Opiates: NOT DETECTED
Tetrahydrocannabinol: NOT DETECTED

## 2024-08-03 LAB — HEMOGLOBIN A1C
Hgb A1c MFr Bld: 6.5 % — ABNORMAL HIGH (ref 4.8–5.6)
Mean Plasma Glucose: 139.85 mg/dL

## 2024-08-03 LAB — ETHANOL: Alcohol, Ethyl (B): <15 mg/dL (ref <15)

## 2024-08-03 MED ORDER — MONTELUKAST SODIUM 10 MG PO TABS
10.0000 mg | ORAL_TABLET | Freq: Every day | ORAL | Status: DC
  Administered 2024-08-05 – 2024-08-07 (×3): 10 mg via ORAL
  Filled 2024-08-03 (×5): qty 1

## 2024-08-03 MED ORDER — FAMOTIDINE 20 MG PO TABS
20.0000 mg | ORAL_TABLET | Freq: Two times a day (BID) | ORAL | Status: DC
  Administered 2024-08-05 – 2024-08-08 (×6): 20 mg via ORAL
  Filled 2024-08-03 (×9): qty 1

## 2024-08-03 MED ORDER — INSULIN ASPART 100 UNIT/ML IJ SOLN
0.0000 [IU] | Freq: Three times a day (TID) | INTRAMUSCULAR | Status: DC
  Administered 2024-08-03 – 2024-08-05 (×4): 2 [IU] via SUBCUTANEOUS
  Administered 2024-08-05: 3 [IU] via SUBCUTANEOUS
  Administered 2024-08-06: 2 [IU] via SUBCUTANEOUS
  Administered 2024-08-06: 3 [IU] via SUBCUTANEOUS
  Administered 2024-08-06: 2 [IU] via SUBCUTANEOUS
  Administered 2024-08-07 (×2): 3 [IU] via SUBCUTANEOUS
  Administered 2024-08-07 – 2024-08-08 (×2): 2 [IU] via SUBCUTANEOUS

## 2024-08-03 MED ORDER — METHOCARBAMOL 500 MG PO TABS
1000.0000 mg | ORAL_TABLET | Freq: Three times a day (TID) | ORAL | Status: DC
  Administered 2024-08-04 – 2024-08-08 (×6): 1000 mg via ORAL
  Filled 2024-08-03 (×8): qty 2

## 2024-08-03 MED ORDER — LEVOTHYROXINE SODIUM 75 MCG PO TABS
75.0000 ug | ORAL_TABLET | Freq: Every day | ORAL | Status: DC
  Administered 2024-08-04 – 2024-08-08 (×5): 75 ug via ORAL
  Filled 2024-08-03 (×5): qty 1

## 2024-08-03 MED ORDER — DULOXETINE HCL 30 MG PO CPEP
30.0000 mg | ORAL_CAPSULE | Freq: Every day | ORAL | Status: DC
  Administered 2024-08-06 – 2024-08-08 (×3): 30 mg via ORAL
  Filled 2024-08-03 (×4): qty 1

## 2024-08-03 MED ORDER — ATORVASTATIN CALCIUM 80 MG PO TABS
80.0000 mg | ORAL_TABLET | Freq: Every day | ORAL | Status: DC
  Administered 2024-08-05 – 2024-08-07 (×2): 80 mg via ORAL
  Filled 2024-08-03 (×5): qty 1

## 2024-08-03 MED ORDER — OXYCODONE HCL 5 MG PO TABS: 10.0000 mg | ORAL_TABLET | ORAL | Status: DC | PRN

## 2024-08-03 MED ORDER — MIDAZOLAM HCL 2 MG/2ML IJ SOLN
2.0000 mg | Freq: Once | INTRAMUSCULAR | Status: AC | PRN
  Administered 2024-08-03: 2 mg via INTRAVENOUS
  Filled 2024-08-03: qty 2

## 2024-08-03 MED ORDER — PROMETHAZINE (PHENERGAN) 6.25MG IN NS 50ML IVPB
6.2500 mg | Freq: Once | INTRAVENOUS | Status: DC
  Filled 2024-08-03: qty 50

## 2024-08-03 MED ORDER — ISOSORBIDE MONONITRATE ER 30 MG PO TB24: 60.0000 mg | ORAL_TABLET | Freq: Every morning | ORAL | Status: DC

## 2024-08-03 MED ORDER — HYDROMORPHONE HCL 1 MG/ML IJ SOLN
1.0000 mg | INTRAMUSCULAR | Status: DC | PRN
  Administered 2024-08-03 – 2024-08-05 (×6): 1 mg via INTRAVENOUS
  Filled 2024-08-03 (×6): qty 1

## 2024-08-03 MED ORDER — ENSURE PLUS HIGH PROTEIN PO LIQD
237.0000 mL | Freq: Two times a day (BID) | ORAL | Status: DC
  Administered 2024-08-06 – 2024-08-08 (×5): 237 mL via ORAL

## 2024-08-03 MED ORDER — INSULIN ASPART 100 UNIT/ML IJ SOLN: 0.0000 [IU] | Freq: Three times a day (TID) | INTRAMUSCULAR | Status: DC

## 2024-08-03 MED ORDER — MORPHINE SULFATE (PF) 2 MG/ML IV SOLN
2.0000 mg | Freq: Once | INTRAVENOUS | Status: AC
  Administered 2024-08-03: 2 mg via INTRAVENOUS
  Filled 2024-08-03: qty 1

## 2024-08-03 MED ORDER — FLUTICASONE FUROATE-VILANTEROL 100-25 MCG/ACT IN AEPB
1.0000 | INHALATION_SPRAY | Freq: Every day | RESPIRATORY_TRACT | Status: DC
  Administered 2024-08-07 – 2024-08-08 (×2): 1 via RESPIRATORY_TRACT
  Filled 2024-08-03 (×3): qty 28

## 2024-08-03 MED ORDER — SODIUM CHLORIDE 0.9 % IV SOLN
2.0000 g | Freq: Once | INTRAVENOUS | Status: AC
Start: 2024-08-03 — End: 2024-08-03
  Administered 2024-08-03: 2 g via INTRAVENOUS
  Filled 2024-08-03: qty 10

## 2024-08-03 MED ORDER — METFORMIN HCL 500 MG PO TABS: 500.0000 mg | ORAL_TABLET | Freq: Two times a day (BID) | ORAL | Status: DC

## 2024-08-03 MED ORDER — SODIUM CHLORIDE 0.9 % IV SOLN
6.2500 mg | Freq: Once | INTRAVENOUS | Status: DC
  Filled 2024-08-03: qty 0.25

## 2024-08-03 MED ORDER — METOPROLOL SUCCINATE ER 25 MG PO TB24: 25.0000 mg | ORAL_TABLET | Freq: Every day | ORAL | Status: DC

## 2024-08-03 MED ORDER — PREGABALIN 75 MG PO CAPS: 150.0000 mg | ORAL_CAPSULE | Freq: Three times a day (TID) | ORAL | Status: DC

## 2024-08-03 MED ORDER — PROMETHAZINE (PHENERGAN) 6.25MG IN NS 50ML IVPB
6.2500 mg | Freq: Once | INTRAVENOUS | Status: AC
  Administered 2024-08-03: 6.25 mg via INTRAVENOUS
  Filled 2024-08-03: qty 6.25

## 2024-08-03 MED ORDER — DULOXETINE HCL 60 MG PO CPEP: 60.0000 mg | ORAL_CAPSULE | Freq: Every day | ORAL | Status: DC

## 2024-08-03 MED ORDER — SODIUM CHLORIDE 0.9 % IV SOLN
1.0000 g | INTRAVENOUS | Status: DC
  Administered 2024-08-03 – 2024-08-06 (×4): 1 g via INTRAVENOUS
  Filled 2024-08-03 (×4): qty 10

## 2024-08-03 MED ORDER — OXYCODONE HCL 5 MG PO TABS
10.0000 mg | ORAL_TABLET | ORAL | Status: DC | PRN
  Administered 2024-08-03: 10 mg via ORAL
  Filled 2024-08-03 (×2): qty 2

## 2024-08-03 MED ORDER — DAPAGLIFLOZIN PROPANEDIOL 10 MG PO TABS: 10.0000 mg | ORAL_TABLET | Freq: Every day | ORAL | Status: DC

## 2024-08-03 MED ORDER — OXYCODONE HCL 5 MG PO TABS: 15.0000 mg | ORAL_TABLET | ORAL | Status: DC | PRN

## 2024-08-03 MED ORDER — VITAMIN D3 50 MCG (2000 UT) PO CAPS: 2000.0000 [IU] | ORAL_CAPSULE | Freq: Every day | ORAL | Status: DC

## 2024-08-03 MED ORDER — BUPROPION HCL ER (XL) 150 MG PO TB24
150.0000 mg | ORAL_TABLET | Freq: Every day | ORAL | Status: DC
Start: 2024-08-03 — End: 2024-08-08
  Administered 2024-08-06 – 2024-08-08 (×3): 150 mg via ORAL
  Filled 2024-08-03 (×5): qty 1

## 2024-08-03 MED ORDER — FOSFOMYCIN TROMETHAMINE 3 G PO PACK: 3.0000 g | PACK | Freq: Once | ORAL | Status: DC

## 2024-08-03 MED ORDER — ONDANSETRON HCL 4 MG/2ML IJ SOLN
4.0000 mg | Freq: Four times a day (QID) | INTRAMUSCULAR | Status: DC | PRN
  Administered 2024-08-03 – 2024-08-04 (×3): 4 mg via INTRAVENOUS
  Filled 2024-08-03 (×3): qty 2

## 2024-08-03 MED ORDER — OYSTER SHELL CALCIUM/D3 500-5 MG-MCG PO TABS
1.0000 | ORAL_TABLET | Freq: Every day | ORAL | Status: DC
  Filled 2024-08-03 (×4): qty 1

## 2024-08-03 MED ORDER — SODIUM CHLORIDE 0.9 % IV BOLUS
1000.0000 mL | Freq: Once | INTRAVENOUS | Status: AC
  Administered 2024-08-03: 1000 mL via INTRAVENOUS

## 2024-08-03 NOTE — Progress Notes (Signed)
 Patient verbalized pain all over, Oxy PO administered per orders, patient unable to swallow both pills due to nausea. POC

## 2024-08-03 NOTE — ED Notes (Addendum)
 Patient transported to MRI & 5C called x2 with no answer that pt will come up after imaging.

## 2024-08-03 NOTE — Progress Notes (Signed)
 Patient received from ED alert oriented to person only, on 4L. Upon doing vitals, patient's sats 95% on room air. O2 removed, will monitor. Patient successfully passed swallow test, but triggered an emesis episode. MD notified, antiemetic requested, ordered and administered. Plan of care continues.

## 2024-08-03 NOTE — H&P (Addendum)
 History and Physical    Patient: Shelby Anderson FMW:[id number] DOB: February 05, 1956 DOA: 08/02/2024 DOS: the patient was seen and examined on 08/03/2024 PCP: Ozell Heron HERO, MD  Patient coming from: Home  Chief Complaint: No chief complaint on file.  HPI: Shelby Anderson is a 68 y.o. 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  Pt is a poor historian and unable to provide history due to confusion. HPI obtained from husband, Daril, over the phone who reports the patient started vomiting last weekend while visiting [place] , where she flew and stayed with her family. During this time, she was unable to walk without assistance, and her legs would give out, causing her to fall backwards daily. These symptoms persisted for about a week, with the patient experiencing falls whenever she attempted to walk. Upon returning home, she presented to the ED with her family since her sx failed to improve; of note, her husband was most concerned that she did not recognize him or her grandchildren that reside with them. He states that she has never been confused like this pta, but notes that her falls have been a recurrent issue and possibly related to her multiple medications.  In the ED, pt tachypneic w/p hypoxia (placed on 4L [place] in ED for unclear reasons). Labs unremarkable. UA pos LE, but neg for nitrite/bacteria. EtOH neg. CT abd/pelvis w/o active disease. CTH w/ NAICA. MRI w/ NAICA, but noted moderately advanced chronic signal changes in the cerebral white matter and basal ganglia with progression since 2021. EDP requested admission for ongoing encephalopathy.  Review of Systems: As mentioned in the history of present illness. All other systems  reviewed and are negative. Past Medical History:  Diagnosis Date   Allergy     Anemia    chronic   Angina    Prinzmetal angina   Anxiety and depression    Arthritis    Asthma    Atrial fibrillation (HCC)    h/o AF w/frequent PVCs   Breast cancer (HCC)    Left   Cancer (HCC)    hx of skin cancer    CHF (congestive heart failure) (HCC)    Chronic back pain greater than 3 months duration    on chronic narcotics, treated at pain clinic   Chronic respiratory failure (HCC) 09/15/2015   ONO 09/04/15 + desats >begin O2 at 2l/ m    Colon polyps    hyperplastic   Coronary artery disease    Arrythmia, orthostatic hypotension, HLD, HTN; sees Dr. Ladona   Difficult intubation    TMJ & woke up when they were still cutting on me   Dysrhythmia    sees Dr. 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 Ladona   OSA (obstructive sleep apnea)    Personal history of chemotherapy    Personal history of radiation therapy    Pneumonia    multiple times   PONV (postoperative nausea and vomiting)    Recurrent upper respiratory infection (URI)    S/P left TKA 09/25/2016   Shortness of breath 11/20/11   all the time, sees pulmonlogy, ? asthma   Stenotic cervical os    Stomach ulcer    3 small; found in 05/2011   TMJ (dislocation of temporomandibular joint)    Tuberculosis    + TB SKIN TEST   Type 2 diabetes  mellitus without complication, without long-term current use of insulin  (HCC) 12/06/2015   not on meds    Past Surgical History:  Procedure Laterality Date   ACHILLES TENDON REPAIR  1970's   left ankle   ARTHROSCOPIC REPAIR ACL     left knee cap   BREAST BIOPSY Right 04/08/2013   again in October or November 2020   BREAST LUMPECTOMY Left 12/09/2019   BREAST LUMPECTOMY WITH RADIOACTIVE SEED AND SENTINEL LYMPH NODE BIOPSY Left 12/09/2019   Procedure: LEFT BREAST LUMPECTOMY WITH RADIOACTIVE SEED AND LEFT AXILLARY SENTINEL LYMPH NODE BIOPSY;  Surgeon: Ebbie Cough, MD;  Location: [facility] OR;  Service: General;  Laterality: Left;  PEC BLOCK   CARDIAC CATHETERIZATION     loop recorder   CARPAL TUNNEL RELEASE  unknown   left hand   COLONOSCOPY     ESOPHAGOGASTRODUODENOSCOPY (EGD) WITH PROPOFOL  N/A 10/29/2017   Procedure: ESOPHAGOGASTRODUODENOSCOPY (EGD) WITH PROPOFOL ;  Surgeon: Abran Norleen SAILOR, MD;  Location: [facility] ENDOSCOPY;  Service: Endoscopy;  Laterality: N/A;   LOOP RECORDER IMPLANT     PORT-A-CATH REMOVAL N/A 12/09/2019   Procedure: REMOVAL PORT-A-CATH;  Surgeon: Ebbie Cough, MD;  Location: [facility] OR;  Service: General;  Laterality: N/A;   PORTACATH PLACEMENT N/A 09/23/2019   Procedure: INSERTION PORT-A-CATH Right Internal Brooke RATER;  Surgeon: Ebbie Cough, MD;  Location: [facility] OR;  Service: General;  Laterality: N/A;   post ganglionectomy  1970's   for migraine headaches   pouch string  [phone]   did this 3 times (once w/each pregnancy)   SAVORY DILATION N/A 10/29/2017   Procedure: SAVORY DILATION;  Surgeon: Abran Norleen SAILOR, MD;  Location: [facility] ENDOSCOPY;  Service: Endoscopy;  Laterality: N/A;   TOTAL KNEE ARTHROPLASTY Left 09/25/2016   Procedure: LEFT TOTAL KNEE ARTHROPLASTY;  Surgeon: Cough Car, MD;  Location: [facility] ORS;  Service: Orthopedics;  Laterality: Left;   TUBAL LIGATION  1980's   Social History:  reports that she has never smoked. She has never used  smokeless tobacco. She reports that she does not drink alcohol and does not use drugs.  Allergies  Allergen Reactions   Lodine [Etodolac] Anaphylaxis, Hives and Swelling   Oxycontin  [Oxycodone  Hcl] Anaphylaxis    hives, trouble breathing, tongue swelling (Only Oxycontin ) Tolerates plain oxycodone .   Penicillins Anaphylaxis    Told by a surgeon never to take it again. Has patient had a PCN reaction causing immediate rash, facial/tongue/throat swelling, SOB or lightheadedness with hypotension: Yes Has patient had a PCN reaction causing severe rash involving mucus membranes or skin necrosis: Unknown Has patient had a PCN reaction that required hospitalization: No Has patient had a PCN reaction occurring within the  last 10 years: No If all of the above answers are NO, then may proceed with Cephalosporin use.   Aspirin  Other (See Comments)    High-dose caused GI Bleeds   Darvocet [Propoxyphene N-Acetaminophen ] Hives   Nitroglycerin  Other (See Comments)    IV-BP drops dramatically Can take po   Propoxyphene Hives   Tramadol Hives and Itching   Valium Other (See Comments)    Circulation problems. Legs turned black.    Family History  Problem Relation Age of Onset   Malignant hyperthermia Father    Hypertension Father    Heart disease Father    Diabetes Father    Cancer Father        skin   Hypertension Mother    Heart disease Mother    Multiple myeloma Mother    Cancer Sister        CERVICAL   Hypertension Sister    Cancer Brother 20       MELANOMA   Heart disease Maternal Grandmother    Other Maternal Grandmother 32       complications of childbirth   Heart disease Maternal Grandfather    Cancer Paternal Grandmother        ?    Heart disease Paternal Grandmother    Heart disease Paternal Grandfather    Cancer Brother        LUNG   Diabetes Sister    Hypertension Sister    Heart disease Sister    Cancer Sister    Cancer Brother    Pancreatic cancer Niece 91    Cancer Nephew 40       unknown- currently in the [employer]   Anesthesia problems Neg Hx    Hypotension Neg Hx    Pseudochol deficiency Neg Hx    Colon cancer Neg Hx    Esophageal cancer Neg Hx    Rectal cancer Neg Hx    Stomach cancer Neg Hx    Breast cancer Neg Hx     Prior to Admission medications   Medication Sig Start Date End Date Taking? Authorizing Provider  albuterol  (PROVENTIL ) (2.5 MG/3ML) 0.083% nebulizer solution Inhale 3 mLs (2.5 mg total) by nebulization every 6 (six) hours as needed for wheezing or shortness of breath (J45.40). 08/22/23   Parrett, Madelin RAMAN, NP  albuterol  (VENTOLIN  HFA) 108 (90 Base) MCG/ACT inhaler Inhale 1-2 puffs into the lungs every 4 (four) hours as needed for wheezing or shortness of breath. 08/22/23   Parrett, Madelin RAMAN, NP  atorvastatin  (LIPITOR ) 80 MG tablet Take 1 tablet (80 mg total) by mouth at bedtime. 05/13/24   Ozell Heron HERO, MD  Blood Glucose Monitoring Suppl (ONE TOUCH ULTRA 2) w/Device KIT Use as directed 04/06/22   Koberlein, Junell C, MD  budesonide -formoterol  (SYMBICORT ) 80-4.5 MCG/ACT inhaler Inhale 2 puffs into the lungs every morning and another 2 puffs 12 hours later. 08/22/23   Parrett, Madelin RAMAN, NP  buPROPion  (WELLBUTRIN  XL) 150 MG 24 hr tablet Take 1 tablet (150 mg total) by mouth daily. 04/20/24   Ozell Heron HERO, MD  Calcium  Carbonate-Vitamin D 600-200 MG-UNIT TABS Take 1 tablet by mouth daily. 01/01/11   [provider]  Cholecalciferol  (VITAMIN D3) 50 MCG (2000 UT) capsule Take 2,000 Units by mouth daily.    [provider]  CINNAMON PO Take 1,000 mg 2 (two) times daily by mouth.    [provider]  dapagliflozin  propanediol (FARXIGA ) 10 MG TABS tablet Take 1 tablet (10 mg total) by mouth daily  before breakfast. 05/25/24   Ozell Heron HERO, MD  doxepin  (SINEQUAN ) 50 MG capsule Take 1 capsule (50 mg total) by mouth at bedtime. 07/07/24   Ozell Heron HERO, MD  doxepin  (SINEQUAN ) 50 MG capsule Take 1 capsule  (50 mg total) by mouth at bedtime. 07/13/24     doxycycline  (VIBRAMYCIN ) 100 MG capsule Take 1 capsule (100 mg total) by mouth 2 (two) times daily. 06/26/24   Nafziger, Darleene, NP  DULoxetine  (CYMBALTA ) 30 MG capsule Take 1 capsule (30 mg total) by mouth daily. 07/13/24     DULoxetine  (CYMBALTA ) 60 MG capsule Take 1 capsule (60 mg total) by mouth daily. 05/04/24   Ozell Heron HERO, MD  famotidine  (PEPCID ) 20 MG tablet Take 1 tablet (20 mg total) by mouth 2 (two) times daily. 03/26/24 03/21/25  [place], PA-C  fluticasone  (FLONASE ) 50 MCG/ACT nasal spray Place 1 spray into both nostrils at bedtime.    [provider]  furosemide  (LASIX ) 20 MG tablet Take 1 tablet (20 mg total) by mouth daily as needed for ankle swelling. 02/11/24   Ozell Heron HERO, MD  glipiZIDE  (GLUCOTROL  XL) 5 MG 24 hr tablet Take 1 tablet (5 mg total) by mouth every morning. 06/22/24   Ozell Heron HERO, MD  hydrALAZINE  (APRESOLINE ) 25 MG tablet Take 1 tablet (25 mg total) by mouth daily as needed for blood pressure >140/67mmhg, up to three times a day 08/02/23     isosorbide  mononitrate (IMDUR ) 60 MG 24 hr tablet Take 1 tablet (60 mg total) by mouth every morning. 07/27/24   Ozell Heron HERO, MD  Lancets [facility] DELICA PLUS LANCET30G) MISC USE TO CHECK BLOOD SUGAR 2 TIMES A DAY 10/04/21   Koberlein, Junell C, MD  levothyroxine  (SYNTHROID ) 75 MCG tablet Take 1 tablet (75 mcg total) by mouth daily before breakfast. 04/20/24   Ozell Heron HERO, MD  lisinopril  (ZESTRIL ) 5 MG tablet Take 1 tablet (5 mg total) by mouth every morning. 04/20/24   Ozell Heron HERO, MD  LYRICA  150 MG capsule Take 150 mg by mouth 3 (three) times daily.  05/28/18   [provider]  metFORMIN  (GLUCOPHAGE ) 500 MG tablet Take 1 tablet (500 mg total) by mouth 2 (two) times daily with a meal. 02/17/24   Ozell Heron HERO, MD  metoprolol  succinate (TOPROL -XL) 25 MG 24 hr tablet Take 1 tablet (25 mg total) by mouth daily. TAKE WITH OR IMMEDIATELY  FOLLOWING A MEAL. 07/28/24   Ladona Heinz, MD  Misc Natural Products (GLUCOS-CHONDROIT-MSM COMPLEX PO) Take 2 tablets by mouth daily.    [provider]  mometasone  (ELOCON ) 0.1 % cream Apply 1 Application topically daily as needed (for redness/raw). Apply topically behind ears 09/12/20   [provider]  montelukast  (SINGULAIR ) 10 MG tablet Take 1 tablet (10 mg total) by mouth at bedtime. 08/22/23   Parrett, Madelin RAMAN, NP  Multiple Vitamin (MULITIVITAMIN WITH MINERALS) TABS Take 1 tablet by mouth daily.    [provider]  NEXIUM  40 MG capsule Take 1 capsule (40 mg total) by mouth in the morning and at bedtime. 03/26/24 03/21/25  [place], PA-C  nitroGLYCERIN  (NITROSTAT ) 0.4 MG SL tablet Place 1 tablet under the tongue every 5 minutes as needed for chest pain up to 3 times. If no relief, call 911. 05/01/23   Ladona Heinz, MD  NURTEC 75 MG TBDP Take 1 each by mouth daily as needed. Patient taking differently: Take 75 mg by mouth daily as needed (for migraines). 10/29/22  Ozell Heron HERO, MD  ondansetron  (ZOFRAN ) 4 MG tablet Take 1 tablet (4 mg total) by mouth every 8 (eight) hours as needed for nausea or vomiting. 08/27/23   Abran Norleen SAILOR, MD  [facility] ULTRA test strip USE TO CHECK BLOOD SUGAR 2 TIMES A DAY 01/01/22   Koberlein, Junell C, MD  oxyCODONE  (ROXICODONE ) 15 MG immediate release tablet Take 1 tablet by mouth four times a day Patient taking differently: Take 15 mg by mouth every 8 (eight) hours as needed for pain. 06/06/22     potassium chloride  (KLOR-CON ) 10 MEQ tablet Take 2 tablets by mouth daily as needed--take on days you take furosemide  06/19/24   Ladona Heinz, MD  potassium chloride  (KLOR-CON ) 10 MEQ tablet Take 2 tablets (20 mEq total) by mouth every morning. 07/07/24   Ozell Heron HERO, MD  pregabalin  (LYRICA ) 150 MG capsule Take 1 capsule (150 mg total) by mouth 3 (three) times daily. 05/05/24     promethazine  (PHENERGAN ) 25 MG tablet Take 1 tablet (25 mg total) by  mouth at bedtime. 02/17/24   Abran Norleen SAILOR, MD  spironolactone  (ALDACTONE ) 25 MG tablet Take 1 tablet (25 mg total) by mouth every morning. 06/19/24 09/17/24  Ladona Heinz, MD  tiZANidine  (ZANAFLEX ) 4 MG tablet Take 2 tablets (8 mg total) by mouth 3 (three) times daily. 01/02/24     vitamin B-12 (CYANOCOBALAMIN ) 500 MCG tablet Take 500 mcg by mouth daily.     [provider]  vitamin E 200 UNIT capsule Take 200 Units by mouth daily.    [provider]    Physical Exam: Vitals:   08/03/24 9191 08/03/24 0811 08/03/24 0812 08/03/24 0813  BP: 128/63     Pulse: (!) 57 68 (!) 59 (!) 55  Resp: 10 (!) 22 (!) 21 (!) 21  Temp:      TempSrc:      SpO2: 100% (!) 87% 92% 94%   General: Alert, oriented x1 (person), resting comfortably in no acute distress Respiratory: Lungs clear to auscultation bilaterally with normal respiratory effort; no w/r/r Cardiovascular: Regular rate and rhythm w/o m/r/g   Data Reviewed:  Lab Results  Component Value Date   WBC 7.0 08/02/2024   HGB 12.9 08/02/2024   HCT 38.0 08/02/2024   MCV 85.7 08/02/2024   PLT 282 08/02/2024   Lab Results  Component Value Date   GLUCOSE 184 (H) 08/02/2024   CALCIUM  10.1 08/02/2024   NA 141 08/02/2024   K 3.5 08/02/2024   CO2 19 (L) 08/02/2024   CL 107 08/02/2024   BUN 18 08/02/2024   CREATININE 1.12 (H) 08/02/2024   Lab Results  Component Value Date   ALT 25 11/13/2023   AST 31 11/13/2023   ALKPHOS 67 11/13/2023   BILITOT 0.4 11/13/2023   Lab Results  Component Value Date   INR 1.0 08/02/2024   INR 1.0 10/30/2019   INR 1.04 10/05/2016   Radiology: MR BRAIN WO CONTRAST Result Date: 08/03/2024 CLINICAL DATA:  68 year old female with altered mental status. EXAM: MRI HEAD WITHOUT CONTRAST TECHNIQUE: Multiplanar, multiecho pulse sequences of the brain and surrounding structures were obtained without intravenous contrast. COMPARISON:  Head CT this morning.  Brain MRI 02/04/2020. FINDINGS: Brain: No  restricted diffusion to suggest acute infarction. No midline shift, mass effect, evidence of mass lesion, ventriculomegaly, extra-axial collection or acute intracranial hemorrhage. Cervicomedullary junction and pituitary are within normal limits. Chronic widely scattered cerebral white matter T2 and FLAIR hyperintensity in both hemispheres, mildly progressed since  2021 bilaterally, the extent is moderate for age. Similar mild progression of moderate T2 and FLAIR heterogeneity now in the bilateral basal ganglia. Thalami, brainstem, and cerebellum appear spared. No cortical encephalomalacia or chronic cerebral blood products identified. Vascular: Major intracranial vascular flow voids are stable since 2021, preserved. 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 Visualized bone marrow signal is within normal limits. Negative visible cervical spine otherwise. Sinuses/Orbits: Stable and negative. Other: Mastoids are clear. Visible internal auditory structures appear normal. Negative visible scalp and face. IMPRESSION: 1. No acute intracranial abnormality. 2. Moderately advanced chronic signal changes in the cerebral white matter and basal ganglia with progression since 2021. These are nonspecific but most commonly due to small vessel disease. Electronically Signed   By: VEAR Hurst M.D.   On: 08/03/2024 09:12   CT Head Wo Contrast Result Date: 08/03/2024 CLINICAL DATA:  Mental status change, unknown cause EXAM: CT HEAD WITHOUT CONTRAST TECHNIQUE: Contiguous axial images were obtained from the base of the skull through the vertex without intravenous contrast. RADIATION DOSE REDUCTION: This exam was performed according to the departmental dose-optimization program which includes automated exposure control, adjustment of the mA and/or kV according to patient size and/or use of iterative reconstruction technique. COMPARISON:  MRI head February 04, 2020.  FINDINGS: Brain: No evidence of acute infarction, hemorrhage, hydrocephalus, extra-axial collection or mass lesion/mass effect. Moderate patchy white matter hypodensities are nonspecific but compatible with chronic microvascular ischemic disease. Mildly motion limited study. Vascular: No hyperdense vessel or unexpected calcification. Skull: Normal. Negative for fracture or focal lesion. Sinuses/Orbits: No acute finding. IMPRESSION: 1. No evidence of acute intracranial abnormality. 2. Moderate chronic microvascular ischemic disease. Electronically Signed   By: Gilmore GORMAN Molt M.D.   On: 08/03/2024 05:51   CT ABDOMEN PELVIS W CONTRAST Result Date: 08/02/2024 CLINICAL DATA:  Acute abdominal pain. EXAM: CT ABDOMEN AND PELVIS WITH CONTRAST TECHNIQUE: Multidetector CT imaging of the abdomen and pelvis was performed using the standard protocol following bolus administration of intravenous contrast. RADIATION DOSE REDUCTION: This exam was performed according to the departmental dose-optimization program which includes automated exposure control, adjustment of the mA and/or kV according to patient size and/or use of iterative reconstruction technique. CONTRAST:  75mL OMNIPAQUE  IOHEXOL  350 MG/ML SOLN COMPARISON:  CT chest abdomen and pelvis 07/31/2023 FINDINGS: Lower chest: No acute abnormality. Hepatobiliary: No focal liver abnormality is seen. No gallstones, gallbladder wall thickening, or biliary dilatation. Pancreas: Unremarkable. No pancreatic ductal dilatation or surrounding inflammatory changes. Spleen: Normal in size without focal abnormality. Adrenals/Urinary Tract: Adrenal glands are unremarkable. Kidneys are normal, without renal calculi, focal lesion, or hydronephrosis. Bladder is unremarkable. Stomach/Bowel: There is a small hiatal hernia. Stomach is within normal limits. Appendix appears normal. No evidence of bowel wall thickening, distention, or inflammatory changes. Vascular/Lymphatic: Aortic  atherosclerosis. No enlarged abdominal or pelvic lymph nodes. Reproductive: Posterior uterine fibroid is present measuring 1 cm. Adnexa are within normal limits. Other: There small fat containing bilateral inguinal hernias. There is no ascites. Musculoskeletal: No acute or significant osseous findings. There surgical clips in the left breast with scarring. IMPRESSION: 1. No acute localizing process in the abdomen or pelvis. 2. Small hiatal hernia. 3. Small uterine fibroid. Aortic Atherosclerosis (ICD10-I70.0). Electronically Signed   By: Greig Pique M.D.   On: 08/02/2024 23:56   DG Chest 2 View Result Date: 08/02/2024 CLINICAL DATA:  Dyspnea EXAM: CHEST - 2 VIEW COMPARISON:  11/13/2023 FINDINGS: Lungs are well expanded, symmetric,  and clear. No pneumothorax or pleural effusion. Cardiac size within normal limits. Pulmonary vascularity is normal. Implanted loop recorder noted. Osseous structures are age-appropriate. No acute bone abnormality. IMPRESSION: No active cardiopulmonary disease. Electronically Signed   By: Dorethia Molt M.D.   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  Acute encephalopathy High suspicion for polypharmacy, but will need to exclude infectious etiology; CT/MRI neg from stroke, but showed advancing microvascular disease -SLP consulted; apprec eval/recs -IV CTX daily for now (s/p aztreonam  x1 in ED) -HOLD pta Lyrica  150mg  TID for now and doxepin  50mg  nightly -F/u urine and blood cultures -Minimize narcotics  OUD -Multimodal pain control with tylenol  1g TID, robaxin  1g TID -IV dilaudid  1mg  q3h prn -HOLD pta Lyrica  per above -Consider reduced dose oxycodone  at discharge  Physical deconditioning Recurrent  GLF -PT/OT consulted; apprec eval  HTN -PTA metoprolol  XL -HOLD pta lisinopril , lasix , hydralazine , and Imdur  as these could be contributing to recurrent falls  Mood disorder -PTA duloxetine , and wellbutrin  -HOLD pta doxepin  per above  Hypothyroid -PTA synthroid   N/V -IV zofran  prn  OSA -CPAP at bedtime   Advance Care Planning:   Code Status: Full Code   Consults: N/A  Family Communication: Hubabnd, Ricky  Severity of Illness: The appropriate patient status for this patient is INPATIENT. Inpatient status is judged to be reasonable and necessary in order to provide the required intensity of service to ensure the patient's safety. The patient's presenting symptoms, physical exam findings, and initial radiographic and laboratory data in the context of their chronic comorbidities is felt to place them at high risk for further clinical deterioration. Furthermore, it is not anticipated that the patient will be medically stable for discharge from the hospital within 2 midnights of admission.   * I certify that at the point of admission it is my clinical judgment that the patient will require inpatient hospital care spanning beyond 2 midnights from the point of admission due to high intensity of service, high risk for further deterioration and high frequency of surveillance required.*   ------- I spent 65 minutes reviewing previous notes, at the bedside counseling/discussing the treatment plan, and performing clinical documentation.  Author: Marsha Ada, MD 08/03/2024 10:15 AM  For on call review [email or web address].

## 2024-08-03 NOTE — Evaluation (Signed)
 Physical Therapy Evaluation Patient Details Name: Shelby Anderson MRN: [id number] DOB: [birth year] Today's Date: 08/03/2024  History of Present Illness  68 yo F who presented to the ED on 08/02/24 with ongoing confusion, memory changes, and falls. Admitted for workup for ongoing encephalopathy. PMH Afib, breast CA, CHF, fibro, HLD, HTN, MI, hx of chemo and radiation therapy, L TKA, hx (+) TB test, DM, achilles tendon repair, ACL repair  Clinical Impression   Received in bed, pleasantly confused but oriented to self and location only- often stated [place] in response to unrelated questions, worked here for quite a long time per family. Able to mobilize briefly in the room with MinA and RW, required heavy multimodal cues for navigation and safety. Anticipate that as medical status improves, mobility and cognition will as well. She has good support from family at home. Will continue to work with her in house.         If plan is discharge home, recommend the following: A little help with walking and/or transfers;Supervision due to cognitive status;Direct supervision/assist for medications management;Direct supervision/assist for financial management;Assistance with cooking/housework;Assist for transportation;A little help with bathing/dressing/bathroom;Help with stairs or ramp for entrance   Can travel by private vehicle        Equipment Recommendations Rolling walker (2 wheels)  Recommendations for Other Services       Functional Status Assessment Patient has had a recent decline in their functional status and demonstrates the ability to make significant improvements in function in a reasonable and predictable amount of time.     Precautions / Restrictions Precautions Precautions: Fall Recall of Precautions/Restrictions: Impaired Restrictions Weight Bearing Restrictions Per Provider Order: No      Mobility  Bed Mobility Overal bed mobility: Needs Assistance Bed Mobility: Supine  to Sit, Sit to Supine     Supine to sit: Min assist Sit to supine: Modified independent (Device/Increase time)   General bed mobility comments: light MinA to boost to full upright at EOB  and gain balance    Transfers Overall transfer level: Needs assistance Equipment used: Rolling walker (2 wheels) Transfers: Sit to/from Stand Sit to Stand: Contact guard assist           General transfer comment: extra time and effort    Ambulation/Gait Ambulation/Gait assistance: Min assist Gait Distance (Feet): 6 Feet Assistive device: Rolling walker (2 wheels) Gait Pattern/deviations: Step-through pattern, Decreased step length - right, Decreased step length - left       General Gait Details: slow but steady with RW, quickly became nauseous with gait. Needed MinA and multimodal cues for navigating in room  Stairs            Wheelchair Mobility     Tilt Bed    Modified Rankin (Stroke Patients Only)       Balance Overall balance assessment: History of Falls, Needs assistance   Sitting balance-Leahy Scale: Fair     Standing balance support: Bilateral upper extremity supported, Reliant on assistive device for balance Standing balance-Leahy Scale: Poor                               Pertinent Vitals/Pain Pain Assessment Pain Assessment: Faces Faces Pain Scale: Hurts a little bit Pain Descriptors / Indicators: Discomfort Pain Intervention(s): Limited activity within patient's tolerance, Monitored during session    Home Living Family/patient expects to be discharged to:: Private residence Living Arrangements: Spouse/significant other;Other relatives Available Help at  Discharge: Family Type of Home: House Home Access: Ramped entrance       Home Layout: Laundry or work area in basement Home Equipment: [place] - single point      Prior Function Prior Level of Function : Independent/Modified Independent                     Extremity/Trunk  Assessment   Upper Extremity Assessment Upper Extremity Assessment: Defer to OT evaluation         Cervical / Trunk Assessment Cervical / Trunk Assessment: Normal  Communication   Communication Communication: No apparent difficulties    Cognition Arousal: Alert Behavior During Therapy: Flat affect   PT - Cognitive impairments: Orientation, Awareness, Memory, Attention, Initiation, Sequencing, Problem solving, Safety/Judgement   Orientation impairments: Person, Place                     Following commands: Impaired Following commands impaired: Follows one step commands inconsistently, Follows one step commands with increased time     Cueing Cueing Techniques: Verbal cues, Gestural cues, Tactile cues, Visual cues     General Comments      Exercises     Assessment/Plan    PT Assessment Patient needs continued PT services  PT Problem List Decreased strength;Decreased cognition;Decreased activity tolerance;Decreased safety awareness;Decreased balance;Decreased mobility;Decreased coordination       PT Treatment Interventions DME instruction;Gait training;Cognitive remediation;Patient/family education;Functional mobility training;Stair training;Manual techniques;Therapeutic activities;Therapeutic exercise;Balance training;Neuromuscular re-education    PT Goals (Current goals can be found in the Care Plan section)  Acute Rehab PT Goals Patient Stated Goal: get better PT Goal Formulation: With patient/family Time For Goal Achievement: 08/17/24 Potential to Achieve Goals: Good    Frequency Min 1X/week     Co-evaluation               AM-PAC PT 6 Clicks Mobility  Outcome Measure Help needed turning from your back to your side while in a flat bed without using bedrails?: None Help needed moving from lying on your back to sitting on the side of a flat bed without using bedrails?: A Little Help needed moving to and from a bed to a chair (including a  wheelchair)?: A Little Help needed standing up from a chair using your arms (e.g., wheelchair or bedside chair)?: A Little Help needed to walk in hospital room?: A Little Help needed climbing 3-5 steps with a railing? : A Lot 6 Click Score: 18    End of Session Equipment Utilized During Treatment: Gait belt Activity Tolerance: Patient tolerated treatment well Patient left: in bed;with call bell/phone within reach;with family/visitor present Nurse Communication: Mobility status PT Visit Diagnosis: Unsteadiness on feet (R26.81);Difficulty in walking, not elsewhere classified (R26.2);Muscle weakness (generalized) (M62.81);Repeated falls (R29.6)    Time: 8880-8865 PT Time Calculation (min) (ACUTE ONLY): 15 min   Charges:   PT Evaluation $PT Eval Moderate Complexity: 1 Mod   PT General Charges $$ ACUTE PT VISIT: 1 Visit        Josette Rough, PT, DPT 08/03/24 11:50 AM

## 2024-08-03 NOTE — ED Notes (Signed)
 Pt began vomiting, constancy liquid and color green. Lasted about 5 minutes before continuing to dry heaving.

## 2024-08-03 NOTE — Evaluation (Signed)
 Clinical/Bedside Swallow Evaluation Patient Details  Name: Shelby Anderson MRN: [id number] Date of Birth: [birth year]  Today's Date: 08/03/2024 Time: SLP Start Time (ACUTE ONLY): 1337 SLP Stop Time (ACUTE ONLY): 1352 SLP Time Calculation (min) (ACUTE ONLY): 15 min  Past Medical History:  Past Medical History:  Diagnosis Date   Allergy     Anemia    chronic   Angina    Prinzmetal angina   Anxiety and depression    Arthritis    Asthma    Atrial fibrillation (HCC)    h/o AF w/frequent PVCs   Breast cancer (HCC)    Left   Cancer (HCC)    hx of skin cancer    CHF (congestive heart failure) (HCC)    Chronic back pain greater than 3 months duration    on chronic narcotics, treated at pain clinic   Chronic respiratory failure (HCC) 09/15/2015   ONO 09/04/15 + desats >begin O2 at 2l/ m    Colon polyps    hyperplastic   Coronary artery disease    Arrythmia, orthostatic hypotension, HLD, HTN; sees Dr. Ladona   Difficult intubation    TMJ & woke up when they were still cutting on me   Dysrhythmia    sees Dr. 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 Ladona   OSA (obstructive sleep apnea)    Personal history of chemotherapy    Personal history of radiation therapy    Pneumonia    multiple times   PONV (postoperative nausea and vomiting)    Recurrent upper respiratory infection (URI)    S/P  left TKA 09/25/2016   Shortness of breath 11/20/11   all the time, sees pulmonlogy, ? asthma   Stenotic cervical os    Stomach ulcer    3 small; found in 05/2011   TMJ (dislocation of temporomandibular joint)    Tuberculosis    + TB SKIN TEST   Type 2 diabetes mellitus without complication, without long-term current use of insulin  (HCC) 12/06/2015   not on meds    Past Surgical History:  Past Surgical History:  Procedure Laterality Date   ACHILLES TENDON REPAIR  1970's   left ankle   ARTHROSCOPIC REPAIR ACL     left knee cap   BREAST BIOPSY Right 04/08/2013   again in October or November 2020   BREAST LUMPECTOMY Left 12/09/2019   BREAST LUMPECTOMY WITH RADIOACTIVE SEED AND SENTINEL LYMPH NODE BIOPSY Left 12/09/2019   Procedure: LEFT BREAST LUMPECTOMY WITH RADIOACTIVE SEED AND LEFT AXILLARY SENTINEL LYMPH NODE BIOPSY;  Surgeon: Ebbie Cough, MD;  Location: [facility] OR;  Service: General;  Laterality: Left;  PEC BLOCK   CARDIAC CATHETERIZATION     loop recorder   CARPAL  TUNNEL RELEASE  unknown   left hand   COLONOSCOPY     ESOPHAGOGASTRODUODENOSCOPY (EGD) WITH PROPOFOL  N/A 10/29/2017   Procedure: ESOPHAGOGASTRODUODENOSCOPY (EGD) WITH PROPOFOL ;  Surgeon: Abran Norleen SAILOR, MD;  Location: [facility] END[facility]COPY;  Service: Endoscopy;  Laterality: N/A;   LOOP RECORDER IMPLANT     PORT-A-CATH REMOVAL N/A 12/09/2019   Procedure: REMOVAL PORT-A-CATH;  Surgeon: Ebbie Cough, MD;  Location: [facility] OR;  Service: General;  Laterality: N/A;   PORTACATH PLACEMENT N/A 09/23/2019   Procedure: INSERTION PORT-A-CATH Right Internal Brooke RATER;  Surgeon: Ebbie Cough, MD;  Location: [facility] OR;  Service: General;  Laterality: N/A;   post ganglionectomy  1970's   for migraine headaches   pouch string  [phone]   did this 3 times (once w/each pregnancy)   SAVORY DILATION N/A 10/29/2017   Procedure: SAVORY DILATION;  Surgeon: Abran Norleen SAILOR, MD;  Location: [facility] ENDOSCOPY;  Service: Endoscopy;   Laterality: N/A;   TOTAL KNEE ARTHROPLASTY Left 09/25/2016   Procedure: LEFT TOTAL KNEE ARTHROPLASTY;  Surgeon: Cough Car, MD;  Location: [facility] ORS;  Service: Orthopedics;  Laterality: Left;   TUBAL LIGATION  1980's   HPI:  68 yo who presented to the ED on 08/02/24 with ongoing confusion, memory changes, and falls. Admitted for workup for ongoing encephalopathy. MRI no acute intracranial abnormality, moderately advanced chronic signal changes in the cerebral white matter and basal ganglia with progression since 2021. These are nonspecific but most commonly due to small vessel disease. CXR no active cardiopulmonary disease. Barium esophagram 2018 recurrent stricture at GE junction just above small hiatal hernia. PMH Afib, breast CA, CHF, fibro, HLD, HTN, MI, hx of chemo and radiation therapy, L TKA, hx (+) TB test, DM, achilles tendon repair, ACL repair    Assessment / Plan / Recommendation  Clinical Impression  Pt denies current or recent oropharyngeal dysphagia or esophageal involvement (history of stricture/dilation 2018). Spouse states she has been vomitting for several days.  Oromotor function is normal and she is missing upper dentition with 4-5 lower teeth, several that are very loose and in poor condition. Her oropharyngeal swallow appeared within normal limits without s/s aspiration. She masticated majority of the cracker then expectorated saying she didn't like the taste. Also reported gingerale did not taste normal. She endorsed some nausea after po's but denied pharyngeal or esophageal globus sensation. Recommend pt continue regular, thin, pills with liquid. Provided pt with menu to order foods she desired. No further ST warranted. SLP Visit Diagnosis: Dysphagia, unspecified (R13.10)    Aspiration Risk  Mild aspiration risk    Diet Recommendation Regular;Thin liquid    Liquid Administration via: Cup;Straw Medication Administration: Whole meds with liquid Supervision: Patient able to  self feed Postural Changes: Seated upright at 90 degrees    Other  Recommendations Oral Care Recommendations: Oral care BID     Assistance Recommended at Discharge    Functional Status Assessment Patient has not had a recent decline in their functional status  Frequency and Duration            Prognosis        Swallow Study   General Date of Onset: 08/02/24 HPI: 68 yo who presented to the ED on 08/02/24 with ongoing confusion, memory changes, and falls. Admitted for workup for ongoing encephalopathy. MRI no acute intracranial abnormality, moderately advanced chronic signal changes in the cerebral white matter and basal ganglia with progression since 2021. These are nonspecific but most commonly due to small vessel disease. CXR no  active cardiopulmonary disease. Barium esophagram 2018 recurrent stricture at GE junction just above small hiatal hernia. PMH Afib, breast CA, CHF, fibro, HLD, HTN, MI, hx of chemo and radiation therapy, L TKA, hx (+) TB test, DM, achilles tendon repair, ACL repair Type of Study: Bedside Swallow Evaluation Previous Swallow Assessment:  (none) Diet Prior to this Study: Regular;Thin liquids (Level 0) Temperature Spikes Noted: No Respiratory Status: Room air History of Recent Intubation: No Behavior/Cognition: Alert;Cooperative;Pleasant mood;Other (Comment) (reports some nausea) Oral Cavity Assessment: Within Functional Limits Oral Care Completed by SLP: No Oral Cavity - Dentition: Poor condition;Missing dentition (missing upper, has several lower dentition that are very loose- states she eats regular texture at home) Vision: Functional for self-feeding Self-Feeding Abilities: Able to feed self Patient Positioning: Upright in bed Baseline Vocal Quality: Normal Volitional Cough: Strong Volitional Swallow: Able to elicit    Oral/Motor/Sensory Function Overall Oral Motor/Sensory Function: Within functional limits   Ice Chips Ice chips: Not tested   Thin  Liquid Thin Liquid: Within functional limits Presentation: Straw    Nectar Thick Nectar Thick Liquid: Not tested   Honey Thick Honey Thick Liquid: Not tested   Puree Puree: Within functional limits   Solid     Solid: Within functional limits (masticated functionally but expectorated due to taste)      Dustin, Olam Bull 08/03/2024,2:25 PM

## 2024-08-03 NOTE — ED Provider Notes (Signed)
 [place] EMERGENCY DEPARTMENT AT [facility] Provider Note   CSN: [id number] Arrival date & time: 08/02/24  2025     Patient presents with: No chief complaint on file.   Shelby Anderson is a 68 y.o. female.  Patient with extensive medical history including type II DM, hypertension, coronary artery disease, GERD, chronic pain, obesity, dilated cardiomyopathy presents to the emergency department with family with multiple complaints.  Patient's family states that 1 week ago patient was experiencing multiple episodes of emesis which resolved on its own.  She traveled to [place]  that weekend and stayed there until Friday.  She was mostly normal without significant emesis or stomach upset throughout the week.  After returning home the nausea and vomiting resumed.  Saturday evening family states the patient stopped communicating with them.  She would moan but was not verbalizing.  The last time she was seen well was approximately 24 hours or more ago.  Today the patient is nonverbal during my assessment.  She moans and cannot follow basic commands.  She grimaces in pain when I touch her stomach.   HPI     Prior to Admission medications   Medication Sig Start Date End Date Taking? Authorizing Provider  albuterol  (PROVENTIL ) (2.5 MG/3ML) 0.083% nebulizer solution Inhale 3 mLs (2.5 mg total) by nebulization every 6 (six) hours as needed for wheezing or shortness of breath (J45.40). 08/22/23   Parrett, Madelin RAMAN, NP  albuterol  (VENTOLIN  HFA) 108 (90 Base) MCG/ACT inhaler Inhale 1-2 puffs into the lungs every 4 (four) hours as needed for wheezing or shortness of breath. 08/22/23   Parrett, Madelin RAMAN, NP  atorvastatin  (LIPITOR ) 80 MG tablet Take 1 tablet (80 mg total) by mouth at bedtime. 05/13/24   Ozell Heron HERO, MD  Blood Glucose Monitoring Suppl (ONE TOUCH ULTRA 2) w/Device KIT Use as directed 04/06/22   Koberlein, Junell C, MD  budesonide -formoterol  (SYMBICORT ) 80-4.5 MCG/ACT inhaler  Inhale 2 puffs into the lungs every morning and another 2 puffs 12 hours later. 08/22/23   Parrett, Madelin RAMAN, NP  buPROPion  (WELLBUTRIN  XL) 150 MG 24 hr tablet Take 1 tablet (150 mg total) by mouth daily. 04/20/24   Ozell Heron HERO, MD  Calcium  Carbonate-Vitamin D 600-200 MG-UNIT TABS Take 1 tablet by mouth daily. 01/01/11   [provider]  Cholecalciferol  (VITAMIN D3) 50 MCG (2000 UT) capsule Take 2,000 Units by mouth daily.    [provider]  CINNAMON PO Take 1,000 mg 2 (two) times daily by mouth.    [provider]  dapagliflozin  propanediol (FARXIGA ) 10 MG TABS tablet Take 1 tablet (10 mg total) by mouth daily before breakfast. 05/25/24   Ozell Heron HERO, MD  doxepin  (SINEQUAN ) 50 MG capsule Take 1 capsule (50 mg total) by mouth at bedtime. 07/07/24   Ozell Heron HERO, MD  doxepin  (SINEQUAN ) 50 MG capsule Take 1 capsule (50 mg total) by mouth at bedtime. 07/13/24     doxycycline  (VIBRAMYCIN ) 100 MG capsule Take 1 capsule (100 mg total) by mouth 2 (two) times daily. 06/26/24   Nafziger, Darleene, NP  DULoxetine  (CYMBALTA ) 30 MG capsule Take 1 capsule (30 mg total) by mouth daily. 07/13/24     DULoxetine  (CYMBALTA ) 60 MG capsule Take 1 capsule (60 mg total) by mouth daily. 05/04/24   Ozell Heron HERO, MD  famotidine  (PEPCID ) 20 MG tablet Take 1 tablet (20 mg total) by mouth 2 (two) times daily. 03/26/24 03/21/25  [place], PA-C  fluticasone  (FLONASE ) 50 MCG/ACT  nasal spray Place 1 spray into both nostrils at bedtime.    [provider]  furosemide  (LASIX ) 20 MG tablet Take 1 tablet (20 mg total) by mouth daily as needed for ankle swelling. 02/11/24   Ozell Heron HERO, MD  glipiZIDE  (GLUCOTROL  XL) 5 MG 24 hr tablet Take 1 tablet (5 mg total) by mouth every morning. 06/22/24   Ozell Heron HERO, MD  hydrALAZINE  (APRESOLINE ) 25 MG tablet Take 1 tablet (25 mg total) by mouth daily as needed for blood pressure >140/21mmhg, up to three times a day 08/02/23      isosorbide  mononitrate (IMDUR ) 60 MG 24 hr tablet Take 1 tablet (60 mg total) by mouth every morning. 07/27/24   Ozell Heron HERO, MD  Lancets [facility] DELICA PLUS LANCET30G) MISC USE TO CHECK BLOOD SUGAR 2 TIMES A DAY 10/04/21   Koberlein, Junell C, MD  levothyroxine  (SYNTHROID ) 75 MCG tablet Take 1 tablet (75 mcg total) by mouth daily before breakfast. 04/20/24   Ozell Heron HERO, MD  lisinopril  (ZESTRIL ) 5 MG tablet Take 1 tablet (5 mg total) by mouth every morning. 04/20/24   Ozell Heron HERO, MD  LYRICA  150 MG capsule Take 150 mg by mouth 3 (three) times daily.  05/28/18   [provider]  metFORMIN  (GLUCOPHAGE ) 500 MG tablet Take 1 tablet (500 mg total) by mouth 2 (two) times daily with a meal. 02/17/24   Ozell Heron HERO, MD  metoprolol  succinate (TOPROL -XL) 25 MG 24 hr tablet Take 1 tablet (25 mg total) by mouth daily. TAKE WITH OR IMMEDIATELY FOLLOWING A MEAL. 07/28/24   Ladona Heinz, MD  Misc Natural Products (GLUCOS-CHONDROIT-MSM COMPLEX PO) Take 2 tablets by mouth daily.    [provider]  mometasone  (ELOCON ) 0.1 % cream Apply 1 Application topically daily as needed (for redness/raw). Apply topically behind ears 09/12/20   [provider]  montelukast  (SINGULAIR ) 10 MG tablet Take 1 tablet (10 mg total) by mouth at bedtime. 08/22/23   Parrett, Madelin RAMAN, NP  Multiple Vitamin (MULITIVITAMIN WITH MINERALS) TABS Take 1 tablet by mouth daily.    [provider]  NEXIUM  40 MG capsule Take 1 capsule (40 mg total) by mouth in the morning and at bedtime. 03/26/24 03/21/25  [place], PA-C  nitroGLYCERIN  (NITROSTAT ) 0.4 MG SL tablet Place 1 tablet under the tongue every 5 minutes as needed for chest pain up to 3 times. If no relief, call 911. 05/01/23   Ladona Heinz, MD  NURTEC 75 MG TBDP Take 1 each by mouth daily as needed. Patient taking differently: Take 75 mg by mouth daily as needed (for migraines). 10/29/22   Ozell Heron HERO, MD  ondansetron  (ZOFRAN ) 4  MG tablet Take 1 tablet (4 mg total) by mouth every 8 (eight) hours as needed for nausea or vomiting. 08/27/23   Abran Norleen SAILOR, MD  [facility] ULTRA test strip USE TO CHECK BLOOD SUGAR 2 TIMES A DAY 01/01/22   Koberlein, Junell C, MD  oxyCODONE  (ROXICODONE ) 15 MG immediate release tablet Take 1 tablet by mouth four times a day Patient taking differently: Take 15 mg by mouth every 8 (eight) hours as needed for pain. 06/06/22     potassium chloride  (KLOR-CON ) 10 MEQ tablet Take 2 tablets by mouth daily as needed--take on days you take furosemide  06/19/24   Ladona Heinz, MD  potassium chloride  (KLOR-CON ) 10 MEQ tablet Take 2 tablets (20 mEq total) by mouth every morning. 07/07/24   Ozell Heron HERO, MD  pregabalin  (LYRICA )  150 MG capsule Take 1 capsule (150 mg total) by mouth 3 (three) times daily. 05/05/24     promethazine  (PHENERGAN ) 25 MG tablet Take 1 tablet (25 mg total) by mouth at bedtime. 02/17/24   Abran Norleen SAILOR, MD  spironolactone  (ALDACTONE ) 25 MG tablet Take 1 tablet (25 mg total) by mouth every morning. 06/19/24 09/17/24  Ladona Heinz, MD  tiZANidine  (ZANAFLEX ) 4 MG tablet Take 2 tablets (8 mg total) by mouth 3 (three) times daily. 01/02/24     vitamin B-12 (CYANOCOBALAMIN ) 500 MCG tablet Take 500 mcg by mouth daily.     [provider]  vitamin E 200 UNIT capsule Take 200 Units by mouth daily.    [provider]    Allergies: Lodine [etodolac], Oxycontin  [oxycodone  hcl], Penicillins, Aspirin , Darvocet [propoxyphene n-acetaminophen ], Nitroglycerin , Propoxyphene, Tramadol, and Valium    Review of Systems  Updated Vital Signs BP 120/68   Pulse 67   Temp 97.6 F (36.4 C) (Oral)   Resp 11   SpO2 98%   Physical Exam Vitals and nursing note reviewed.  Constitutional:      Appearance: She is well-developed. She is obese.  HENT:     Head: Normocephalic and atraumatic.  Eyes:     Extraocular Movements: Extraocular movements intact.     Conjunctiva/sclera: Conjunctivae normal.      Pupils: Pupils are equal, round, and reactive to light.  Cardiovascular:     Rate and Rhythm: Normal rate and regular rhythm.  Pulmonary:     Effort: Pulmonary effort is normal. No respiratory distress.     Breath sounds: Normal breath sounds.  Abdominal:     Palpations: Abdomen is soft.     Tenderness: There is abdominal tenderness.  Musculoskeletal:        General: No swelling.     Cervical back: Neck supple.  Skin:    General: Skin is warm and dry.     Capillary Refill: Capillary refill takes less than 2 seconds.  Neurological:     Mental Status: She is alert.     GCS: GCS eye subscore is 4. GCS verbal subscore is 4. GCS motor subscore is 6.     Cranial Nerves: No facial asymmetry.     Comments: Patient able to follow some simple commands but unable to participate in detailed neuro exam. Patient intermittently oriented to person and place. Patient unable to tell me who her husband is, calls him [place] [facility] like [facility]. PERRL. EOM intact. Patient was able to ambulate to the restroom.   Psychiatric:        Mood and Affect: Mood normal.     (all labs ordered are listed, but only abnormal results are displayed) Labs Reviewed  BASIC METABOLIC PANEL WITH GFR - Abnormal; Notable for the following components:      Result Value   CO2 19 (*)    Glucose, Bld 184 (*)    Creatinine, Ser 1.12 (*)    GFR, Estimated 54 (*)    Anion gap 17 (*)    All other components within normal limits  CBC - Abnormal; Notable for the following components:   RBC 5.16 (*)    All other components within normal limits  BRAIN NATRIURETIC PEPTIDE - Abnormal; Notable for the following components:   B Natriuretic Peptide 138.1 (*)    All other components within normal limits  URINALYSIS, ROUTINE W REFLEX MICROSCOPIC - Abnormal; Notable for the following components:   APPearance HAZY (*)  Specific Gravity, Urine 1.040 (*)    Glucose, UA >=500 (*)    Ketones, ur 20 (*)    Protein, ur 30 (*)     Leukocytes,Ua MODERATE (*)    All other components within normal limits  I-STAT VENOUS BLOOD GAS, ED - Abnormal; Notable for the following components:   pH, Ven 7.514 (*)    pCO2, Ven 33.2 (*)    pO2, Ven 46 (*)    Acid-Base Excess 4.0 (*)    All other components within normal limits  PROTIME-INR  RAPID URINE DRUG SCREEN, HOSP PERFORMED  ETHANOL  TROPONIN I (HIGH SENSITIVITY)  TROPONIN I (HIGH SENSITIVITY)    EKG: None  Radiology: CT Head Wo Contrast Result Date: 08/03/2024 CLINICAL DATA:  Mental status change, unknown cause EXAM: CT HEAD WITHOUT CONTRAST TECHNIQUE: Contiguous axial images were obtained from the base of the skull through the vertex without intravenous contrast. RADIATION DOSE REDUCTION: This exam was performed according to the departmental dose-optimization program which includes automated exposure control, adjustment of the mA and/or kV according to patient size and/or use of iterative reconstruction technique. COMPARISON:  MRI head February 04, 2020. FINDINGS: Brain: No evidence of acute infarction, hemorrhage, hydrocephalus, extra-axial collection or mass lesion/mass effect. Moderate patchy white matter hypodensities are nonspecific but compatible with chronic microvascular ischemic disease. Mildly motion limited study. Vascular: No hyperdense vessel or unexpected calcification. Skull: Normal. Negative for fracture or focal lesion. Sinuses/Orbits: No acute finding. IMPRESSION: 1. No evidence of acute intracranial abnormality. 2. Moderate chronic microvascular ischemic disease. Electronically Signed   By: Gilmore GORMAN Molt M.D.   On: 08/03/2024 05:51   CT ABDOMEN PELVIS W CONTRAST Result Date: 08/02/2024 CLINICAL DATA:  Acute abdominal pain. EXAM: CT ABDOMEN AND PELVIS WITH CONTRAST TECHNIQUE: Multidetector CT imaging of the abdomen and pelvis was performed using the standard protocol following bolus administration of intravenous contrast. RADIATION DOSE REDUCTION: This exam  was performed according to the departmental dose-optimization program which includes automated exposure control, adjustment of the mA and/or kV according to patient size and/or use of iterative reconstruction technique. CONTRAST:  75mL OMNIPAQUE  IOHEXOL  350 MG/ML SOLN COMPARISON:  CT chest abdomen and pelvis 07/31/2023 FINDINGS: Lower chest: No acute abnormality. Hepatobiliary: No focal liver abnormality is seen. No gallstones, gallbladder wall thickening, or biliary dilatation. Pancreas: Unremarkable. No pancreatic ductal dilatation or surrounding inflammatory changes. Spleen: Normal in size without focal abnormality. Adrenals/Urinary Tract: Adrenal glands are unremarkable. Kidneys are normal, without renal calculi, focal lesion, or hydronephrosis. Bladder is unremarkable. Stomach/Bowel: There is a small hiatal hernia. Stomach is within normal limits. Appendix appears normal. No evidence of bowel wall thickening, distention, or inflammatory changes. Vascular/Lymphatic: Aortic atherosclerosis. No enlarged abdominal or pelvic lymph nodes. Reproductive: Posterior uterine fibroid is present measuring 1 cm. Adnexa are within normal limits. Other: There small fat containing bilateral inguinal hernias. There is no ascites. Musculoskeletal: No acute or significant osseous findings. There surgical clips in the left breast with scarring. IMPRESSION: 1. No acute localizing process in the abdomen or pelvis. 2. Small hiatal hernia. 3. Small uterine fibroid. Aortic Atherosclerosis (ICD10-I70.0). Electronically Signed   By: Greig Pique M.D.   On: 08/02/2024 23:56   DG Chest 2 View Result Date: 08/02/2024 CLINICAL DATA:  Dyspnea EXAM: CHEST - 2 VIEW COMPARISON:  11/13/2023 FINDINGS: Lungs are well expanded, symmetric, and clear. No pneumothorax or pleural effusion. Cardiac size within normal limits. Pulmonary vascularity is normal. Implanted loop recorder noted. Osseous structures are age-appropriate. No acute bone  abnormality. IMPRESSION:  No active cardiopulmonary disease. Electronically Signed   By: Dorethia Molt M.D.   On: 08/02/2024 23:34     Procedures   Medications Ordered in the ED  ondansetron  (ZOFRAN -ODT) disintegrating tablet 4 mg (4 mg Oral Given 08/02/24 2031)  iohexol  (OMNIPAQUE ) 350 MG/ML injection 75 mL (75 mLs Intravenous Contrast Given 08/02/24 2331)  midazolam  (VERSED ) injection 2 mg (2 mg Intravenous Given 08/03/24 0104)  aztreonam  (AZACTAM ) 2 g in sodium chloride  0.9 % 100 mL IVPB (0 g Intravenous Stopped 08/03/24 0609)  sodium chloride  0.9 % bolus 1,000 mL (1,000 mLs Intravenous New Bag/Given 08/03/24 0628)                                    Medical Decision Making Amount and/or Complexity of Data Reviewed Labs: ordered. Radiology: ordered.  Risk Prescription drug management. Decision regarding hospitalization.   This patient presents to the ED for concern of nausea, vomiting, altered mental status, this involves an extensive number of treatment options, and is a complaint that carries with it a high risk of complications and morbidity.  The differential diagnosis includes electrolyte abnormality, encephalopathy, stroke, intra-abdominal etiology, sepsis, others   Co morbidities / Chronic conditions that complicate the patient evaluation  Hypertension, type II DM   Additional history obtained:  Additional history obtained from EMR   Lab Tests:  I Ordered, and personally interpreted labs.  The pertinent results include: UA with moderate leukocytes, 21-50 WBC concerning for infection; unremarkable UDS and ethanol.  Mild anion gap of 17, slightly decreased bicarb at 19   Imaging Studies ordered:  I ordered imaging studies including CT abdomen pelvis with contrast, chest x-ray, CT head without contrast, MR brain without contrast I independently visualized and interpreted imaging which showed no acute disease on chest x-ray,  1. No acute localizing process in the abdomen  or pelvis.  2. Small hiatal hernia.  3. Small uterine fibroid.    Aortic Atherosclerosis   1. No evidence of acute intracranial abnormality.  2. Moderate chronic microvascular ischemic disease   I agree with the radiologist interpretation   Cardiac Monitoring: / EKG:  The patient was maintained on a cardiac monitor.  I personally viewed and interpreted the cardiac monitored which showed an underlying rhythm of: Normal sinus rhythm   Problem List / ED Course / Critical interventions / Medication management   I ordered medication including versed , zofran , aztreonam    Reevaluation of the patient after these medicines showed that the patient had improvement in agitation  Consultations Obtained:  I requested consultation with the hospitalist   Test / Admission - Considered:  Workup so far remarkable for signs of urinary tract infection.  Patient treated with antibiotics.  Due to contrasted study performed just after 11 PM, the noncontrast head CT cannot be performed for 6 hours.  I also ordered an MRI but MRI did not feel comfortable taking the patient since the patient was unable to answer other questions for MRI safety.  Patient intermittently oriented to person and place at this time.  If this happens to be a stroke she was outside of the window for code stroke activation at the time of her arrival with last known well being over 24 hours earlier.  Management will not change based on the timing of the imaging at this point.  CT head grossly unremarkable.  MRI of the brain has not been completed as of this time.  Patient remains confused and altered from baseline.  She does have evidence of urinary tract infection but unclear if this is driving self or confusion.  At this time I feel patient would benefit from admission to the hospital for further evaluation and management. Patient care transferred to Dr. Greig Buttery      Final diagnoses:  Altered mental status, unspecified altered  mental status type  Urinary tract infection without hematuria, site unspecified    ED Discharge Orders     None          Logan Ubaldo KATHEE DEVONNA 08/03/24 [id number]    Raford Lenis, MD 08/03/24 [id number]

## 2024-08-03 NOTE — Plan of Care (Signed)
  Problem: Education: Goal: Knowledge of General Education information will improve Description: Including pain rating scale, medication(s)/side effects and non-pharmacologic comfort measures Outcome: Progressing   Problem: Coping: Goal: Level of anxiety will decrease Outcome: Progressing   Problem: Coping: Goal: Ability to adjust to condition or change in health will improve Outcome: Progressing   Problem: Skin Integrity: Goal: Risk for impaired skin integrity will decrease Outcome: Progressing   Problem: Safety: Goal: Ability to remain free from injury will improve Outcome: Progressing   Problem: Pain Managment: Goal: General experience of comfort will improve and/or be controlled Outcome: Progressing

## 2024-08-03 NOTE — ED Provider Notes (Signed)
 Patient care assumed as a handoff from prior provider. Case discussed in detail at signout including history, physical exam findings, diagnostic workup, and treatment course up to this point.  I have reviewed the chart, labs, imaging, and clinical course.   Please refer to initial ED note since the prior ED provider primarily managed this patient.  I am assuming care in the later phase of the ED visit.  I have reevaluated the patient to confirm clinical stability and plan of care.    Hx of cardiomyopathy, CAD, HTN Nausea and vomiting x1 week LKN 8pm Saturday >> weakness and confusion  Versed  for agitation and now calm  Admit for encephalopathy Alert to person only (not baseline) Husband with her (poor historian) Treated for UTI - could be the cause but not convinced  CT abd unremarkable and MRI pending >>>Handoff pending sign out for admission    Physical Exam  BP 120/68   Pulse 67   Temp 97.6 F (36.4 C) (Oral)   Resp 11   SpO2 98%    ED Course / MDM    Medical Decision Making Amount and/or Complexity of Data Reviewed Labs: ordered. Radiology: ordered.  Risk Prescription drug management. Decision regarding hospitalization.   VS reviewed Pt calm and cooperative this morning No distress Confused and oriented only to person No abd pain or respiratory distress MRI ordered and pending admission       Shelby Dettmer, DO 08/03/24 [id number]

## 2024-08-03 NOTE — ED Notes (Signed)
 Family went home for the night. Phone number in chart for husband if needed.

## 2024-08-03 NOTE — ED Notes (Signed)
 Per family pt is normally able to speak to them but has not been able to speak normally since 8/31 but unable to give a time. EDP aware

## 2024-08-03 NOTE — ED Notes (Addendum)
 Pt found to be sitting on the end of the stretcher and had pulled off all of the monitoring equipment. Pt trying to pull out IV and saying that she needs to leave. When asked where she needs to go pt does not say anything. Pt states that she has pain all over. EDP made aware

## 2024-08-04 ENCOUNTER — Other Ambulatory Visit (HOSPITAL_COMMUNITY): Payer: Self-pay

## 2024-08-04 ENCOUNTER — Other Ambulatory Visit: Payer: Self-pay

## 2024-08-04 DIAGNOSIS — G9340 Encephalopathy, unspecified: Secondary | ICD-10-CM | POA: Diagnosis not present

## 2024-08-04 LAB — BASIC METABOLIC PANEL WITH GFR
Anion gap: 11 (ref 5–15)
BUN: 25 mg/dL — ABNORMAL HIGH (ref 8–23)
CO2: 25 mmol/L (ref 22–32)
Calcium: 9.1 mg/dL (ref 8.9–10.3)
Chloride: 107 mmol/L (ref 98–111)
Creatinine, Ser: 0.89 mg/dL (ref 0.44–1.00)
GFR, Estimated: >60 mL/min (ref >60)
Glucose, Bld: 130 mg/dL — ABNORMAL HIGH (ref 70–99)
Potassium: 3.4 mmol/L — ABNORMAL LOW (ref 3.5–5.1)
Sodium: 143 mmol/L (ref 135–145)

## 2024-08-04 LAB — CBC
HCT: 37.7 % (ref 36.0–46.0)
Hemoglobin: 12.1 g/dL (ref 12.0–15.0)
MCH: 28.5 pg (ref 26.0–34.0)
MCHC: 32.1 g/dL (ref 30.0–36.0)
MCV: 88.9 fL (ref 80.0–100.0)
Platelets: 235 K/uL (ref 150–400)
RBC: 4.24 MIL/uL (ref 3.87–5.11)
RDW: 16.3 % — ABNORMAL HIGH (ref 11.5–15.5)
WBC: 7.7 K/uL (ref 4.0–10.5)
nRBC: 0 % (ref 0.0–0.2)

## 2024-08-04 LAB — GLUCOSE, CAPILLARY
Glucose-Capillary: 139 mg/dL — ABNORMAL HIGH (ref 70–99)
Glucose-Capillary: 118 mg/dL — ABNORMAL HIGH (ref 70–99)
Glucose-Capillary: 142 mg/dL — ABNORMAL HIGH (ref 70–99)
Glucose-Capillary: 180 mg/dL — ABNORMAL HIGH (ref 70–99)

## 2024-08-04 LAB — AMMONIA: Ammonia: 17 umol/L (ref 9–35)

## 2024-08-04 MED ORDER — PROCHLORPERAZINE EDISYLATE 10 MG/2ML IJ SOLN
10.0000 mg | Freq: Four times a day (QID) | INTRAMUSCULAR | Status: DC | PRN
  Administered 2024-08-04 – 2024-08-07 (×7): 10 mg via INTRAVENOUS
  Filled 2024-08-04 (×7): qty 2

## 2024-08-04 MED ORDER — ONDANSETRON HCL 4 MG/2ML IJ SOLN
4.0000 mg | Freq: Four times a day (QID) | INTRAMUSCULAR | Status: DC | PRN
  Administered 2024-08-04 – 2024-08-06 (×5): 4 mg via INTRAVENOUS
  Filled 2024-08-04 (×5): qty 2

## 2024-08-04 MED ORDER — PROMETHAZINE (PHENERGAN) 6.25MG IN NS 50ML IVPB
6.2500 mg | Freq: Once | INTRAVENOUS | Status: AC | PRN
  Administered 2024-08-04: 6.25 mg via INTRAVENOUS
  Filled 2024-08-04: qty 6.25

## 2024-08-04 MED FILL — Levothyroxine Sodium Tab 75 MCG: ORAL | 30 days supply | Qty: 30 | Fill #3 | Status: AC

## 2024-08-04 NOTE — Progress Notes (Signed)
 Occupational Therapy Evaluation Patient Details Name: Shelby Anderson MRN: [id number] DOB: 03-Dec-1956 Today's Date: 08/04/2024   History of Present Illness   68 yo F who presented to the ED on 08/02/24 with ongoing confusion, memory changes, and falls. Admitted for workup for ongoing encephalopathy. 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 Upon eval, pt with nausea that she reports she has had all day, but is agreeable to attempt OT evaluation. Pt needing up to CGA for bed mobility and min A for clearing hips up from EOB; on attempt to stand more significant dry heaving with pt holding emesis bag to mouth. Further mobility deferred. Suspect pt will progress well; recommending discharge home once medically ready with HHOT.      If plan is discharge home, recommend the following:   A little help with walking and/or transfers;A little help with bathing/dressing/bathroom;Assistance with cooking/housework;Assist for transportation;Help with stairs or ramp for entrance     Functional Status Assessment   Patient has had a recent decline in their functional status and demonstrates the ability to make significant improvements in function in a reasonable and predictable amount of time.     Equipment Recommendations   None recommended by OT     Recommendations for Other Services   Speech consult (cog)     Precautions/Restrictions   Precautions Precautions: Fall Recall of Precautions/Restrictions: Impaired Restrictions Weight Bearing Restrictions Per Provider Order: No     Mobility Bed Mobility Overal bed mobility: Needs Assistance Bed Mobility: Supine to Sit, Sit to Supine     Supine to sit: Supervision Sit to supine: Modified independent  (Device/Increase time)        Transfers Overall transfer level: Needs assistance Equipment used: 1 person hand held assist Transfers: Sit to/from Stand Sit to Stand: Min assist           General transfer comment: initially clearing hips from EOB but then beginning to dry heave and needing emesis bag. Pt never experuencing emesis      Balance Overall balance assessment: History of Falls, Needs assistance   Sitting balance-Leahy Scale: Fair         Standing balance comment: unable to tolerate standing this session due to constant dry heaving                           ADL either performed or assessed with clinical judgement   ADL Overall ADL's : Needs assistance/impaired Eating/Feeding: Bed level;Independent Eating/Feeding Details (indicate cue type and reason): drinking from straw with cup Grooming: Set up;Sitting   Upper Body Bathing: Set up;Sitting   Lower Body Bathing: Minimal assistance;Sitting/lateral leans   Upper Body Dressing : Set up;Sitting   Lower Body Dressing: Minimal assistance;Sit to/from stand   Toilet Transfer: Minimal assistance Statistician Details (indicate cue type and reason): STS only this session; limited by nausea and dry heaving                 Vision   Vision Assessment?: No apparent visual deficits Additional Comments: not formally assessed     Perception         Praxis         Pertinent Vitals/Pain Pain Assessment Pain Assessment: No/denies pain  Extremity/Trunk Assessment Upper Extremity Assessment Upper Extremity Assessment: Generalized weakness   Lower Extremity Assessment Lower Extremity Assessment: Defer to PT evaluation   Cervical / Trunk Assessment Cervical / Trunk Assessment: Normal   Communication Communication Communication: No apparent difficulties   Cognition Arousal: Alert Behavior During Therapy: Flat affect Cognition: Cognition impaired     Awareness: Intellectual  awareness intact, Online awareness impaired Memory impairment (select all impairments): Short-term memory Attention impairment (select first level of impairment): Sustained attention, Selective attention Executive functioning impairment (select all impairments): Problem solving, Sequencing                   Following commands: Impaired Following commands impaired: Follows one step commands with increased time     Cueing  General Comments   Cueing Techniques: Verbal cues;Gestural cues;Tactile cues;Visual cues  VSS, pt with mild dizziness at EOB with BP down to 120s systolically. pt BPs prior to session 140s-160s   Exercises     Shoulder Instructions      Home Living Family/patient expects to be discharged to:: Private residence Living Arrangements: Spouse/significant other;Other relatives Available Help at Discharge: Family Type of Home: House Home Access: Ramped entrance     Home Layout: Laundry or work area in basement     Bathroom Shower/Tub: Chief Strategy Officer: Standard     Home Equipment: [employer] - single point          Prior Functioning/Environment Prior Level of Function : Independent/Modified Independent                    OT Problem List:     OT Treatment/Interventions:        OT Goals(Current goals can be found in the care plan section)   Acute Rehab OT Goals Patient Stated Goal: get better OT Goal Formulation: With patient Time For Goal Achievement: 08/18/24 Potential to Achieve Goals: Good   OT Frequency:  Min 2X/week    Co-evaluation              AM-PAC OT 6 Clicks Daily Activity     Outcome Measure Help from another person eating meals?: None Help from another person taking care of personal grooming?: A Little Help from another person toileting, which includes using toliet, bedpan, or urinal?: A Little Help from another person bathing (including washing, rinsing, drying)?: A Little Help from another  person to put on and taking off regular upper body clothing?: A Little Help from another person to put on and taking off regular lower body clothing?: A Little 6 Click Score: 19   End of Session Equipment Utilized During Treatment: Gait belt Nurse Communication: Mobility status;Other (comment) (cont nausea, provded ginger ale and water  at end of session)  Activity Tolerance: Other (comment) (limited by nausea and associated dry heaving) Patient left: in bed;with call bell/phone within reach;with bed alarm set  OT Visit Diagnosis: Unsteadiness on feet (R26.81);Muscle weakness (generalized) (M62.81);Other symptoms and signs involving cognitive function                Time: 1242-1316 OT Time Calculation (min): 34 min Charges:  OT General Charges $OT Visit: 1 Visit OT Evaluation $OT Eval Moderate Complexity: 1 Mod OT Treatments $Self Care/Home Management : 8-22 mins  Elma JONETTA Lebron FREDERICK, OTR/L [facility] Acute Rehabilitation Office: [phone]   Elma JONETTA Lebron 08/04/2024, 3:03 PM

## 2024-08-04 NOTE — Progress Notes (Signed)
 PROGRESS NOTE  Shelby Anderson  DOB: [birth year]  PCP: Ozell Heron HERO, MD FMW:[id number]  DOA: 08/02/2024  LOS: 1 day  Hospital Day: 3  Brief narrative: Shelby Anderson is a 68 y.o. 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 Per report,patient started vomiting last weekend while visiting [place] , where she flew and stayed with her family. During this time, she was unable to walk without assistance.  Her legs would give out, causing her to fall backwards daily. These symptoms persisted for about a week, with the patient experiencing falls whenever she attempted to walk.  Upon returning home, her husband noticed she was not able to recognize him or their grandchildren.  With worsening symptoms and new altered mental status, patient was brought to the ED.    In the ED, patient was afebrile, hemodynamically stable, O2 sat in the 80s and required up to 4 L. Labs unremarkable Urinalysis showed hazy yellow urine with moderate leukocytes, no bacteria CT abdomen pelvis unremarkable CT head/ MRI brain ruled out acute abnormalities but showed moderately advanced chronic signal changes in the cerebral white matter and basal ganglia with progression since 2021.   Admitted to [facility]  Subjective: Patient was seen and examined this morning. Looks tired.  Alert, awake, slow to respond but oriented x 3.  Patient complains of intense nausea this morning.  Also reports he vomited 1 x 1  Family not at bedside Chart reviewed In the last 24 hours, afebrile, heart rate in 40s and 50s, blood pressure mostly 140s, breathing  on room air Pending labs.  Assessment and plan: Acute metabolic encephalopathy Multiple possible etiologies were considered. Blood alcohol level negative.  Urinalysis showed pyuria but no clear infection. Brain imagings without acute intracranial abnormality. ammonia level normal. Suspected polypharmacy.  See below for adjustment in neuropsych and pain meds  Chronic pain Opioid use disorder Fibromyalgia. PTA pain meds- oxycodone  15 mg 4 times daily, Lyrica  150 mg 3 times daily Currently both on hold. Pain regimen --- Scheduled: Robaxin  1000 mg 3 times daily --- PRN: Dilaudid  1 mg every 3 hours Minimize use of narcotics  Anxiety/depression PTA meds- Cymbalta  90 mg daily, Wellbutrin  150 mg daily, doxepin  50 mg nightly Currently continued on Cymbalta  30 mg daily and Wellbutrin  150 mg daily.  Doxepin  on hold  Persistent nausea/vomiting 8/31 CT abdomen/pelvis did not show any acute abnormality Continue Compazine  as needed Continue to monitor  Sinus bradycardia  H/o CHF, HTN PTA meds- Toprol  25 mg daily, Farxiga  10 mg daily, Imdur  60 mg daily, lisinopril  5 mg daily, Aldactone  25 mg daily 1, hydralazine  PRN, Lasix  PRN Currently not on any scheduled CHF/HTN meds. Metoprolol  on hold as well because of persistent bradycardia to 40s and 50s.  Could be the cause of falls.  Continue to monitor on telemetry.  Pyuria Urinalysis showed pyuria but no clear infection. Currently on empiric IV Rocephin  Recent Labs  Lab 08/02/24 2027 08/04/24 0957  WBC 7.0 7.7   CAD/HLD Continue Lipitor  Not on any antiplatelet or anticoagulant  H/o A-fib Not on anticoagulant  Type 2 diabetes mellitus A1c 6.5 on 08/03/2024 PTA meds-glipizide  5 mg daily, metformin  500 mg twice  daily, Farxiga  10 mg daily Currently on oral meds on hold Currently on SSI/Accu-Cheks Recent Labs  Lab 08/03/24 1309 08/03/24 1707 08/03/24 2123 08/04/24 0803 08/04/24 1116  GLUCAP 126* 146* 150* 139* 118*    Hypothyroid Continue synthroid   GERD H/o esophageal stricture PPI, Pepcid    Obesity  There is no height or weight on file to calculate BMI. Patient has been advised to make an attempt to improve diet and exercise patterns to aid in weight loss.   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 Home with PT recommended    Mobility:  PT Orders: Active   PT Follow up Rec: Home Health Pt9/12/2023 1146   Goals of care   Code Status: Full Code     DVT prophylaxis:  SCDs Start: 08/03/24 0735   Antimicrobials: Currently on IV Rocephin  Fluid: None Consultants: None Family Communication: None at bedside  Status: Inpatient Level of care:  Telemetry Cardiac   Patient is from: Home Needs to continue in-hospital care: Gradually improving mental status Anticipated d/c to: Hopefully home with PT on 2 to 3 days      Diet:  Diet Order             Diet Carb Modified Fluid consistency: Thin; Room service appropriate? Yes  Diet effective now                   Scheduled Meds:  atorvastatin   80 mg Oral QHS   buPROPion   150 mg Oral Daily   calcium -vitamin D  1 tablet Oral Daily   DULoxetine   30 mg Oral Daily   famotidine   20 mg Oral BID   feeding supplement  237 mL Oral BID BM   fluticasone  furoate-vilanterol  1 puff Inhalation Daily   insulin  aspart  0-15 Units Subcutaneous TID WC   levothyroxine   75 mcg Oral QAC breakfast   methocarbamol   1,000 mg Oral TID   montelukast   10 mg Oral QHS    PRN meds: HYDROmorphone  (DILAUDID ) injection, ondansetron  (ZOFRAN ) IV, prochlorperazine    Infusions:   cefTRIAXone  (ROCEPHIN )  IV Stopped (08/03/24 1301)    Antimicrobials: Anti-infectives (From admission, onward)    Start     Dose/Rate Route Frequency Ordered Stop   08/03/24 1300  cefTRIAXone  (ROCEPHIN )  1 g in sodium chloride  0.9 % 100 mL IVPB        1 g 200 mL/hr over 30 Minutes Intravenous Every 24 hours 08/03/24 1015 08/08/24 1259   08/03/24 1030  fosfomycin (MONUROL ) packet 3 g  Status:  Discontinued        3 g Oral  Once 08/03/24 0939 08/03/24 1015   08/03/24 0430  aztreonam  (AZACTAM ) 2 g in sodium chloride  0.9 % 100 mL IVPB        2 g 200 mL/hr over 30 Minutes Intravenous  Once 08/03/24 0423 08/03/24 0609       Objective: Vitals:   08/04/24 0730 08/04/24 0900  BP: (!) 164/75   Pulse: (!) 55   Resp: 12   Temp: 97.7 F (36.5 C)   SpO2: 94% 96%    Intake/Output Summary (Last 24 hours) at 08/04/2024 1341 Last data filed at 08/04/2024 0700 Gross per 24 hour  Intake 140 ml  Output 20 ml  Net 120 ml   There were no vitals filed for this  visit. Weight change:  There is no height or weight on file to calculate BMI.   Physical Exam: General exam: Pleasant, elderly Caucasian female Skin: No rashes, lesions or ulcers. HEENT: Atraumatic, normocephalic, no obvious bleeding Lungs: Clear to auscultation bilaterally,  CVS: S1, S2, no murmur,   GI/Abd: Soft, nontender, nondistended, bowel sound present,   CNS: Alert, awake, slow to respond but oriented x 3 Psychiatry: Sad affect Extremities: No pedal edema, no calf tenderness,   Data Review: I have personally reviewed the laboratory data and studies available.  F/u labs ordered Unresulted Labs (From admission, onward)     Start     Ordered   08/05/24 0500  Basic metabolic panel with GFR  Tomorrow morning,   R       Question:  Specimen collection method  Answer:  Lab=Lab collect   08/04/24 1341   08/05/24 0500  CBC with Differential/Platelet  Tomorrow morning,   R       Question:  Specimen collection method  Answer:  Lab=Lab collect   08/04/24 1341            Signed, Chapman Rota, MD [facility] 08/04/2024

## 2024-08-04 NOTE — Progress Notes (Signed)
 OT Cancellation Note  Patient Details Name: Shelby Anderson MRN: [id number] DOB: [birth year]   Cancelled Treatment:    Reason Eval/Treat Not Completed: Other (comment) (per RN, nausea poorly controlled and SpO2 dropping at rest. Pt has been given medications she can have for nausea but not helping. Will return at a later time.)  Azreal Stthomas D Walton, OTD, OTR/L [facility] Acute Rehabilitation Office: [phone]   Shelby Anderson 08/04/2024, 8:40 AM

## 2024-08-04 NOTE — Progress Notes (Signed)
 Husband notified, patient transferred to [address].

## 2024-08-04 NOTE — Plan of Care (Signed)

## 2024-08-05 ENCOUNTER — Other Ambulatory Visit (HOSPITAL_COMMUNITY): Payer: Self-pay

## 2024-08-05 ENCOUNTER — Other Ambulatory Visit: Payer: Self-pay

## 2024-08-05 DIAGNOSIS — G9340 Encephalopathy, unspecified: Secondary | ICD-10-CM | POA: Diagnosis not present

## 2024-08-05 LAB — BASIC METABOLIC PANEL WITH GFR
Anion gap: 7 (ref 5–15)
BUN: 22 mg/dL (ref 8–23)
CO2: 27 mmol/L (ref 22–32)
Calcium: 9 mg/dL (ref 8.9–10.3)
Chloride: 106 mmol/L (ref 98–111)
Creatinine, Ser: 0.86 mg/dL (ref 0.44–1.00)
GFR, Estimated: >60 mL/min (ref >60)
Glucose, Bld: 143 mg/dL — ABNORMAL HIGH (ref 70–99)
Potassium: 3.3 mmol/L — ABNORMAL LOW (ref 3.5–5.1)
Sodium: 140 mmol/L (ref 135–145)

## 2024-08-05 LAB — CBC WITH DIFFERENTIAL/PLATELET
Abs Immature Granulocytes: 0.05 K/uL (ref 0.00–0.07)
Basophils Absolute: 0 K/uL (ref 0.0–0.1)
Basophils Relative: 1 %
Eosinophils Absolute: 0.1 K/uL (ref 0.0–0.5)
Eosinophils Relative: 2 %
HCT: 37.5 % (ref 36.0–46.0)
Hemoglobin: 11.9 g/dL — ABNORMAL LOW (ref 12.0–15.0)
Immature Granulocytes: 1 %
Lymphocytes Relative: 15 %
Lymphs Abs: 1.1 K/uL (ref 0.7–4.0)
MCH: 28.3 pg (ref 26.0–34.0)
MCHC: 31.7 g/dL (ref 30.0–36.0)
MCV: 89.3 fL (ref 80.0–100.0)
Monocytes Absolute: 0.6 K/uL (ref 0.1–1.0)
Monocytes Relative: 7 %
Neutro Abs: 5.6 K/uL (ref 1.7–7.7)
Neutrophils Relative %: 74 %
Platelets: 200 K/uL (ref 150–400)
RBC: 4.2 MIL/uL (ref 3.87–5.11)
RDW: 15.8 % — ABNORMAL HIGH (ref 11.5–15.5)
WBC: 7.5 K/uL (ref 4.0–10.5)
nRBC: 0 % (ref 0.0–0.2)

## 2024-08-05 LAB — GLUCOSE, CAPILLARY
Glucose-Capillary: 131 mg/dL — ABNORMAL HIGH (ref 70–99)
Glucose-Capillary: 163 mg/dL — ABNORMAL HIGH (ref 70–99)
Glucose-Capillary: 117 mg/dL — ABNORMAL HIGH (ref 70–99)
Glucose-Capillary: 114 mg/dL — ABNORMAL HIGH (ref 70–99)

## 2024-08-05 LAB — MAGNESIUM: Magnesium: 1.6 mg/dL — ABNORMAL LOW (ref 1.7–2.4)

## 2024-08-05 LAB — PHOSPHORUS: Phosphorus: 3.5 mg/dL (ref 2.5–4.6)

## 2024-08-05 LAB — TSH: TSH: 1.722 u[IU]/mL (ref 0.350–4.500)

## 2024-08-05 MED ORDER — HYDROMORPHONE HCL 1 MG/ML IJ SOLN
0.5000 mg | INTRAMUSCULAR | Status: DC | PRN
  Administered 2024-08-05 – 2024-08-07 (×8): 0.5 mg via INTRAVENOUS
  Filled 2024-08-05 (×8): qty 0.5

## 2024-08-05 MED ORDER — POTASSIUM CHLORIDE 10 MEQ/100ML IV SOLN
10.0000 meq | INTRAVENOUS | Status: AC
  Administered 2024-08-05 (×3): 10 meq via INTRAVENOUS
  Filled 2024-08-05 (×2): qty 100

## 2024-08-05 MED ORDER — METOCLOPRAMIDE HCL 5 MG/ML IJ SOLN
5.0000 mg | Freq: Four times a day (QID) | INTRAMUSCULAR | Status: DC
  Administered 2024-08-05 – 2024-08-08 (×12): 5 mg via INTRAVENOUS
  Filled 2024-08-05 (×12): qty 2

## 2024-08-05 MED ORDER — POTASSIUM CHLORIDE CRYS ER 20 MEQ PO TBCR: 40.0000 meq | EXTENDED_RELEASE_TABLET | Freq: Once | ORAL | Status: DC

## 2024-08-05 NOTE — Progress Notes (Signed)
   08/05/24 2215  BiPAP/CPAP/SIPAP  $ Face Mask Medium Yes  BiPAP/CPAP/SIPAP Pt Type Adult  BiPAP/CPAP/SIPAP Resmed  Respiratory Rate 16 breaths/min  IPAP 18 cmH20  EPAP 6 cmH2O  FiO2 (%) 21 %  Patient Home Machine No  Patient Home Mask No  Patient Home Tubing No  Auto Titrate Yes  Minimum cmH2O 18 cmH2O  Maximum cmH2O 6 cmH2O  Device Plugged into RED Power Outlet Yes

## 2024-08-05 NOTE — Progress Notes (Signed)
 PROGRESS NOTE  Shelby Anderson  DOB: [birth year]  PCP: Ozell Heron HERO, MD FMW:[id number]  DOA: 08/02/2024  LOS: 2 days  Hospital Day: 4  Brief narrative:  Shelby Anderson is a 68 y.o. 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 In the ED, patient was afebrile, hemodynamically stable, O2 sat in the 80s and required up to 4 L. Labs unremarkable Urinalysis showed hazy yellow urine with moderate leukocytes, no bacteria CT abdomen pelvis unremarkable CT head/ MRI brain ruled out acute abnormalities but showed moderately advanced chronic signal changes in the cerebral white matter and basal ganglia with progression since 2021.   Admitted to [facility]  Subjective:  Still with nausea vomiting, very poor diet, generally weak, frail and deconditioned, somnolent most of the day, heart rate remains in the 40s and 50s on telemetry.    Assessment and plan:  Acute metabolic encephalopathy Multiple possible etiologies were considered. Blood alcohol level negative.  Urinalysis showed pyuria but no clear infection. Brain imagings without acute intracranial abnormality.  But with some evidence of vascular dementia, ammonia level normal. Suspected polypharmacy.  See below for adjustment in neuropsych and pain meds - Check B12 and TSH  Chronic pain Opioid use disorder Fibromyalgia. PTA pain meds- oxycodone  15 mg 4 times daily, Lyrica  150 mg 3 times daily Currently both on hold.  He remains somnolent today as well, so I will keep holding. Pain regimen --- Scheduled: Robaxin  1000 mg 3 times daily --- PRN:  Dilaudid  1 mg every 3 hours, will decrease to 0.5 mg every 3 hours as needed given she remains somnolent Minimize use of narcotics  Anxiety/depression PTA meds- Cymbalta  90 mg daily, Wellbutrin  150 mg daily, doxepin  50 mg nightly Currently continued on Cymbalta  30 mg daily and Wellbutrin  150 mg daily.  Doxepin  on hold  Persistent nausea/vomiting 8/31 CT abdomen/pelvis did not show any acute abnormality Continue Compazine  as needed Continue to monitor - Will start on scheduled Reglan  - Will add magnesium  and phosphorus level  Sinus bradycardia  H/o CHF, HTN PTA meds- Toprol  25 mg daily, Farxiga  10 mg daily, Imdur  60 mg daily, lisinopril  5 mg daily, Aldactone  25 mg daily 1, hydralazine  PRN, Lasix  PRN Currently not on any scheduled CHF/HTN meds. Metoprolol  on hold as well because of persistent bradycardia to 40s and 50s.  Could be the cause of falls.  Continue to monitor on telemetry. - Continue to monitor on telemetry, check TSH  Pyuria Urinalysis showed pyuria but no clear infection. Currently on empiric IV Rocephin  Recent Labs  Lab 08/02/24 2027 08/04/24 0957 08/05/24 0524  WBC 7.0 7.7 7.5   CAD/HLD Continue Lipitor  Not on any antiplatelet or anticoagulant  H/o A-fib Not on anticoagulant  Type 2 diabetes mellitus A1c 6.5 on 08/03/2024 PTA meds-glipizide  5 mg daily, metformin  500 mg twice daily, Farxiga  10 mg daily Currently on oral meds on hold Currently on SSI/Accu-Cheks Recent Labs  Lab 08/04/24 0803 08/04/24 1116 08/04/24 1706 08/04/24 2131 08/05/24 0837  GLUCAP 139* 118* 142* 180* 131*   Hypothyroid Continue synthroid   GERD H/o esophageal stricture PPI, Pepcid    Obesity  Body mass index is 33.55 kg/m. Patient has been  advised to make an attempt to improve diet and exercise patterns to aid in weight loss.   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 Home with PT recommended    Mobility:  PT Orders: Active   PT Follow up Rec: Home Health Pt9/12/2023 1146   Goals of care   Code Status: Full Code     DVT prophylaxis:  SCDs Start: 08/03/24 0735   Antimicrobials: Currently on IV Rocephin  Fluid: None Consultants: None Family Communication: None at bedside  Status: Inpatient Level of care:  Telemetry Cardiac   Patient is from: Home Needs to continue in-hospital care: Gradually improving mental status Anticipated d/c to: Hopefully home with PT on 2 to 3 days      Diet:  Diet Order             Diet Carb Modified Fluid consistency: Thin; Room service appropriate? Yes  Diet effective now                   Scheduled Meds:  atorvastatin   80 mg Oral QHS   buPROPion   150 mg Oral Daily   calcium -vitamin D  1 tablet Oral Daily   DULoxetine   30 mg Oral Daily   famotidine   20 mg Oral BID   feeding supplement  237 mL Oral BID BM   fluticasone  furoate-vilanterol  1 puff Inhalation Daily   insulin  aspart  0-15 Units Subcutaneous TID WC   levothyroxine   75 mcg Oral QAC breakfast   methocarbamol   1,000 mg Oral TID   montelukast   10 mg Oral QHS    PRN meds: HYDROmorphone  (DILAUDID ) injection, ondansetron  (ZOFRAN ) IV, prochlorperazine    Infusions:   cefTRIAXone  (ROCEPHIN )  IV Stopped (08/04/24 1447)    Antimicrobials: Anti-infectives (From admission, onward)    Start     Dose/Rate Route Frequency Ordered Stop   08/03/24 1300  cefTRIAXone  (ROCEPHIN ) 1 g in sodium chloride  0.9 % 100 mL IVPB        1 g 200 mL/hr over 30 Minutes Intravenous Every 24 hours 08/03/24 1015 08/08/24 1259   08/03/24 1030  fosfomycin (MONUROL ) packet 3 g  Status:  Discontinued        3 g Oral  Once 08/03/24 0939 08/03/24 1015   08/03/24 0430  aztreonam  (AZACTAM ) 2 g in sodium chloride  0.9 % 100 mL IVPB        2 g 200 mL/hr over 30 Minutes Intravenous  Once 08/03/24  0423 08/03/24 0609       Objective: Vitals:   08/05/24 0440 08/05/24 0838  BP: (!) 151/77 (!) 150/69  Pulse: (!) 49 (!) 50  Resp: 14 16  Temp: 98.2 F (36.8 C) 98 F (36.7 C)  SpO2: 96% 96%    Intake/Output Summary (Last 24 hours) at 08/05/2024 1147 Last data filed at 08/04/2024 1940 Gross per 24 hour  Intake 220 ml  Output 300 ml  Net -80 ml   Filed Weights   08/04/24 1940  Weight: 94.3 kg   Weight change:  Body mass index is 33.55 kg/m.   Physical Exam: Somnolent, but wakes up and answer questions appropriately, falls to sleep right away, generally deconditioned, extremely frail and chronically ill-appearing, poor dentition Symmetrical Chest wall movement, Good air movement bilaterally, CTAB RRR,No Gallops,Rubs or new Murmurs, No Parasternal Heave +ve B.Sounds, Abd  Soft, No tenderness, No rebound - guarding or rigidity. No Cyanosis, Clubbing or edema, No new Rash or bruise     Data Review: I have personally reviewed the laboratory data and studies available.  F/u labs ordered Unresulted Labs (From admission, onward)     Start     Ordered   08/05/24 1148  TSH  Add-on,   AD       Question:  Specimen collection method  Answer:  Lab=Lab collect   08/05/24 1147            Signed, Brayton Lye, MD [facility] 08/05/2024

## 2024-08-05 NOTE — TOC Initial Note (Signed)
 Transition of Care [facility]) - Initial/Assessment Note    Patient Details  Name: Shelby Anderson MRN: [id number] Date of Birth: 1956/04/21  Transition of Care [facility]) CM/SW Contact:    Marval Gell, RN Phone Number: 08/05/2024, 8:33 AM  Clinical Narrative:                  Patient admitted from home for acute metabolic encephalopathy.  ICM will follow up for [facility] planning.   Expected Discharge Plan: Home w Home Health Services Barriers to Discharge: Continued Medical Work up   Patient Goals and CMS Choice Patient states their goals for this hospitalization and ongoing recovery are:: to go home CMS Medicare.gov Compare Post Acute Care list provided to:: Patient Choice offered to / list presented to : Patient      Expected Discharge Plan and Services   Discharge Planning Services: CM Consult Post Acute Care Choice: Home Health Living arrangements for the past 2 months: Single Family Home                                      Prior Living Arrangements/Services Living arrangements for the past 2 months: Single Family Home Lives with:: Spouse                   Activities of Daily Living   ADL Screening (condition at time of admission) Independently performs ADLs?: No  Permission Sought/Granted                  Emotional Assessment              Admission diagnosis:  Acute encephalopathy [G93.40] Urinary tract infection without hematuria, site unspecified [N39.0] Altered mental status, unspecified altered mental status type [R41.82] Patient Active Problem List   Diagnosis Date Noted   Acute encephalopathy 08/03/2024   Moderate episode of recurrent major depressive disorder (HCC) 02/10/2024   UTI (urinary tract infection) 08/01/2023   Nausea and vomiting 07/31/2023   Acute cystitis 07/31/2023   Hypothyroidism 09/11/2022   Dental caries 06/12/2022   Spondylosis without myelopathy or radiculopathy, cervical region 04/21/2021   Neutropenia with  fever (HCC) 10/01/2019   Genetic testing 09/17/2019   Family history of melanoma    Family history of pancreatic cancer    Malignant neoplasm of upper-outer quadrant of left breast in female, estrogen receptor negative (HCC) 09/07/2019   Encounter for loop recorder at end of battery life 04/24/2018   Esophageal stricture 07/01/2017   Hyperlipidemia associated with type 2 diabetes mellitus (HCC) 05/20/2017   Allergic rhinitis 03/11/2017   Dilated cardiomyopathy (HCC) 02/07/2017   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 11/13/2016   Morbid obesity due to excess calories (HCC) 09/26/2016   Increased endometrial stripe thickness 06/27/2016   Intramural leiomyoma of uterus 06/27/2016   Type 2 diabetes mellitus without complication, without long-term current use of insulin  (HCC) 12/06/2015   HTN (hypertension), benign 07/19/2015   Arrhythmia 07/19/2015   Orthostatic hypotension 07/19/2015   GERD (gastroesophageal reflux disease) 07/19/2015   Chronic back pain 07/19/2015   Neuropathy 07/19/2015   Symptomatic PVCs 11/02/2014   Syncope 11/02/2014   Status post placement of implantable loop recorder 11/02/2014   Stenosis of cervix 01/07/2014   Chest pain 12/23/2013   Disorder of cervix 03/10/2013   Vaginal atrophy 03/10/2013   OSA (obstructive sleep apnea) 07/31/2012   Asthma 04/29/2012   Coronary artery disease 11/20/2011  PCP:  Ozell Heron HERO, MD Pharmacy:   [facility] [address] [place] [address] Phone: [phone] Fax: [phone]  [id number] [place], [place] - [address] [place] [address] Phone: [phone] Fax: [phone]  [id number] [place], [place] - [address] [place] [address] Phone: [phone] Fax: [phone]     Social Drivers of Health (SDOH) Social  History: SDOH Screenings   Food Insecurity: No Food Insecurity (08/03/2024)  Housing: Low Risk  (08/03/2024)  Transportation Needs: No Transportation Needs (08/03/2024)  Utilities: Not At Risk (08/03/2024)  Alcohol Screen: Low Risk  (09/27/2023)  Depression (PHQ2-9): Low Risk  (07/09/2024)  Financial Resource Strain: Low Risk  (07/09/2024)  Physical Activity: Sufficiently Active (09/27/2023)  Social Connections: Unknown (08/03/2024)  Stress: No Stress Concern Present (07/09/2024)  Tobacco Use: Low Risk  (06/26/2024)  Health Literacy: Adequate Health Literacy (09/27/2023)   SDOH Interventions:     Readmission Risk Interventions     No data to display

## 2024-08-05 NOTE — Progress Notes (Signed)
 Physical Therapy Treatment Patient Details Name: Shelby Anderson MRN: [id number] DOB: 10/26/1956 Today's Date: 08/05/2024   History of Present Illness 68 yo F who presented to the ED on 08/02/24 with ongoing confusion, memory changes, and falls. Admitted for workup for ongoing encephalopathy. PMH Afib, breast CA, CHF, fibro, HLD, HTN, MI, hx of chemo and radiation therapy, L TKA, hx (+) TB test, DM, achilles tendon repair, ACL repair    PT Comments  Pt tolerated treatment well today. Pt today was able to progress ambulation in hallway with RW CGA however remains limited by persistent nausea. No change in DC/DME recs at this time. PT will continue to follow.     If plan is discharge home, recommend the following: A little help with walking and/or transfers;Supervision due to cognitive status;Direct supervision/assist for medications management;Direct supervision/assist for financial management;Assistance with cooking/housework;Assist for transportation;A little help with bathing/dressing/bathroom;Help with stairs or ramp for entrance   Can travel by private vehicle        Equipment Recommendations  Rolling walker (2 wheels)    Recommendations for Other Services       Precautions / Restrictions Precautions Precautions: Fall Recall of Precautions/Restrictions: Impaired Restrictions Weight Bearing Restrictions Per Provider Order: No     Mobility  Bed Mobility Overal bed mobility: Modified Independent Bed Mobility: Supine to Sit, Sit to Supine     Supine to sit: Modified independent (Device/Increase time) Sit to supine: Modified independent (Device/Increase time)        Transfers Overall transfer level: Needs assistance Equipment used: Rolling walker (2 wheels) Transfers: Sit to/from Stand Sit to Stand: Contact guard assist           General transfer comment: Cues for hand placement.    Ambulation/Gait Ambulation/Gait assistance: Contact guard assist Gait  Distance (Feet): 40 Feet Assistive device: Rolling walker (2 wheels) Gait Pattern/deviations: Step-through pattern, Decreased step length - right, Decreased step length - left Gait velocity: decreased     General Gait Details: slow but steady with RW, quickly became nauseous with gait. Cues for navigating obstacles.   Stairs             Wheelchair Mobility     Tilt Bed    Modified Rankin (Stroke Patients Only)       Balance Overall balance assessment: History of Falls, Needs assistance Sitting-balance support: No upper extremity supported, Feet supported Sitting balance-Leahy Scale: Good     Standing balance support: Bilateral upper extremity supported, Reliant on assistive device for balance Standing balance-Leahy Scale: Poor                              Communication Communication Communication: No apparent difficulties  Cognition Arousal: Alert Behavior During Therapy: Flat affect   PT - Cognitive impairments: Memory, Attention, Initiation, Sequencing, Problem solving, Safety/Judgement, Awareness                       PT - Cognition Comments: Pt was A&Ox4 today however noted to have some slow processing. Following commands: Impaired      Cueing Cueing Techniques: Verbal cues, Gestural cues, Tactile cues, Visual cues  Exercises      General Comments General comments (skin integrity, edema, etc.): VSS on RA      Pertinent Vitals/Pain Pain Assessment Pain Assessment: No/denies pain    Home Living  Prior Function            PT Goals (current goals can now be found in the care plan section) Progress towards PT goals: Progressing toward goals    Frequency    Min 2X/week      PT Plan      Co-evaluation              AM-PAC PT 6 Clicks Mobility   Outcome Measure  Help needed turning from your back to your side while in a flat bed without using bedrails?: None Help needed  moving from lying on your back to sitting on the side of a flat bed without using bedrails?: A Little Help needed moving to and from a bed to a chair (including a wheelchair)?: A Little Help needed standing up from a chair using your arms (e.g., wheelchair or bedside chair)?: A Little Help needed to walk in hospital room?: A Little Help needed climbing 3-5 steps with a railing? : A Lot 6 Click Score: 18    End of Session Equipment Utilized During Treatment: Gait belt Activity Tolerance: Patient tolerated treatment well Patient left: in bed;with call bell/phone within reach;with family/visitor present Nurse Communication: Mobility status PT Visit Diagnosis: Unsteadiness on feet (R26.81);Difficulty in walking, not elsewhere classified (R26.2);Muscle weakness (generalized) (M62.81);Repeated falls (R29.6)     Time: 8650-8597 PT Time Calculation (min) (ACUTE ONLY): 13 min  Charges:    $Gait Training: 8-22 mins PT General Charges $$ ACUTE PT VISIT: 1 Visit                     Janita Camberos B, PT, DPT Acute Rehab Services [phone]    Shelby Anderson 08/05/2024, 4:31 PM

## 2024-08-05 NOTE — Plan of Care (Signed)

## 2024-08-06 ENCOUNTER — Other Ambulatory Visit: Payer: Self-pay

## 2024-08-06 DIAGNOSIS — G9340 Encephalopathy, unspecified: Secondary | ICD-10-CM | POA: Diagnosis not present

## 2024-08-06 LAB — CBC
HCT: 38.9 % (ref 36.0–46.0)
Hemoglobin: 12.5 g/dL (ref 12.0–15.0)
MCH: 28.3 pg (ref 26.0–34.0)
MCHC: 32.1 g/dL (ref 30.0–36.0)
MCV: 88.2 fL (ref 80.0–100.0)
Platelets: 215 K/uL (ref 150–400)
RBC: 4.41 MIL/uL (ref 3.87–5.11)
RDW: 15.6 % — ABNORMAL HIGH (ref 11.5–15.5)
WBC: 8.3 K/uL (ref 4.0–10.5)
nRBC: 0 % (ref 0.0–0.2)

## 2024-08-06 LAB — GLUCOSE, CAPILLARY
Glucose-Capillary: 144 mg/dL — ABNORMAL HIGH (ref 70–99)
Glucose-Capillary: 169 mg/dL — ABNORMAL HIGH (ref 70–99)
Glucose-Capillary: 140 mg/dL — ABNORMAL HIGH (ref 70–99)
Glucose-Capillary: 132 mg/dL — ABNORMAL HIGH (ref 70–99)

## 2024-08-06 LAB — FOLATE: Folate: >20 ng/mL (ref >5.9)

## 2024-08-06 LAB — MAGNESIUM: Magnesium: 1.6 mg/dL — ABNORMAL LOW (ref 1.7–2.4)

## 2024-08-06 LAB — VITAMIN B12: Vitamin B-12: 1187 pg/mL — ABNORMAL HIGH (ref 180–914)

## 2024-08-06 LAB — BASIC METABOLIC PANEL WITH GFR
Anion gap: 14 (ref 5–15)
BUN: 19 mg/dL (ref 8–23)
CO2: 23 mmol/L (ref 22–32)
Calcium: 9.1 mg/dL (ref 8.9–10.3)
Chloride: 106 mmol/L (ref 98–111)
Creatinine, Ser: 0.72 mg/dL (ref 0.44–1.00)
GFR, Estimated: >60 mL/min (ref >60)
Glucose, Bld: 146 mg/dL — ABNORMAL HIGH (ref 70–99)
Potassium: 3.5 mmol/L (ref 3.5–5.1)
Sodium: 143 mmol/L (ref 135–145)

## 2024-08-06 LAB — PHOSPHORUS: Phosphorus: 2.9 mg/dL (ref 2.5–4.6)

## 2024-08-06 MED ORDER — MAGNESIUM SULFATE 2 GM/50ML IV SOLN
2.0000 g | Freq: Once | INTRAVENOUS | Status: AC
  Administered 2024-08-06: 2 g via INTRAVENOUS
  Filled 2024-08-06: qty 50

## 2024-08-06 MED ORDER — POTASSIUM CHLORIDE CRYS ER 20 MEQ PO TBCR
40.0000 meq | EXTENDED_RELEASE_TABLET | Freq: Once | ORAL | Status: AC
Start: 2024-08-06 — End: 2024-08-06
  Administered 2024-08-06: 40 meq via ORAL
  Filled 2024-08-06: qty 2

## 2024-08-06 MED ORDER — SENNOSIDES-DOCUSATE SODIUM 8.6-50 MG PO TABS
1.0000 | ORAL_TABLET | Freq: Two times a day (BID) | ORAL | Status: DC
  Administered 2024-08-06 – 2024-08-07 (×2): 1 via ORAL
  Filled 2024-08-06 (×5): qty 1

## 2024-08-06 NOTE — Progress Notes (Signed)
 Occupational Therapy Treatment Patient Details Name: ZANITA MILLMAN MRN: [id number] DOB: 07/27/1956 Today's Date: 08/06/2024   History of present illness 68 yo F who presented to the ED on 08/02/24 with ongoing confusion, memory changes, and falls. Admitted for workup for ongoing encephalopathy. PMH Afib, breast CA, CHF, fibro, HLD, HTN, MI, hx of chemo and radiation therapy, L TKA, hx (+) TB test, DM, achilles tendon repair, ACL repair   OT comments  Patient making good gains with OT treatment. Patient received in recliner and states no nausea. Patient able to perform mobility and functional transfers with CGA and stood at sink for self care with CGA to supervision with one to no UE support.  Discharge recommendations for HHOT continue to be appropriate.  Acute OT to continue to follow to address established goals to facilitate DC to next venue of care.        If plan is discharge home, recommend the following:  A little help with walking and/or transfers;A little help with bathing/dressing/bathroom;Assistance with cooking/housework;Assist for transportation;Help with stairs or ramp for entrance   Equipment Recommendations  None recommended by OT    Recommendations for Other Services      Precautions / Restrictions Precautions Precautions: Fall Recall of Precautions/Restrictions: Impaired Restrictions Weight Bearing Restrictions Per Provider Order: No       Mobility Bed Mobility Overal bed mobility: Modified Independent             General bed mobility comments: OOB in recliner and left in recliner at end of treatment    Transfers Overall transfer level: Needs assistance Equipment used: Rolling walker (2 wheels) Transfers: Sit to/from Stand Sit to Stand: Contact guard assist           General transfer comment: cues for hand placement and CGA for safety     Balance Overall balance assessment: History of Falls, Needs assistance Sitting-balance support: No  upper extremity supported, Feet supported Sitting balance-Leahy Scale: Good     Standing balance support: Single extremity supported, Bilateral upper extremity supported, No upper extremity supported, During functional activity Standing balance-Leahy Scale: Poor Standing balance comment: stood at sink for self care tasks with one to no UE support                           ADL either performed or assessed with clinical judgement   ADL Overall ADL's : Needs assistance/impaired     Grooming: Wash/dry hands;Wash/dry face;Oral care;Applying deodorant;Supervision/safety;Standing Grooming Details (indicate cue type and reason): at sink Upper Body Bathing: Set up;Standing Upper Body Bathing Details (indicate cue type and reason): at sink     Upper Body Dressing : Set up;Sitting Upper Body Dressing Details (indicate cue type and reason): gown for back     Toilet Transfer: Contact guard assist;Ambulation;Rolling walker (2 wheels) Toilet Transfer Details (indicate cue type and reason): simulated to chair         Functional mobility during ADLs: Contact guard assist;Rolling walker (2 wheels) General ADL Comments: demonstrating progress with self care at sink with no complaints of nausea    Extremity/Trunk Assessment              Vision       Perception     Praxis     Communication Communication Communication: No apparent difficulties   Cognition Arousal: Alert Behavior During Therapy: Flat affect Cognition: Cognition impaired     Awareness: Intellectual awareness intact, Online awareness impaired Memory impairment (select  all impairments): Short-term memory Attention impairment (select first level of impairment): Sustained attention, Selective attention Executive functioning impairment (select all impairments): Problem solving, Sequencing OT - Cognition Comments: motivated to progress home                 Following commands: Impaired Following  commands impaired: Follows one step commands with increased time      Cueing   Cueing Techniques: Verbal cues, Gestural cues, Tactile cues, Visual cues  Exercises      Shoulder Instructions       General Comments no complaints of nausea during visist    Pertinent Vitals/ Pain       Pain Assessment Pain Assessment: No/denies pain  Home Living                                          Prior Functioning/Environment              Frequency  Min 2X/week        Progress Toward Goals  OT Goals(current goals can now be found in the care plan section)  Progress towards OT goals: Progressing toward goals  Acute Rehab OT Goals Patient Stated Goal: to go home OT Goal Formulation: With patient Time For Goal Achievement: 08/18/24 Potential to Achieve Goals: Good ADL Goals Pt Will Perform Grooming: with supervision;standing Pt Will Perform Lower Body Dressing: with supervision;sit to/from stand Pt Will Transfer to Toilet: with supervision;ambulating Additional ADL Goal #1: pt will follow 3 step commands during ADL with min cues  Plan      Co-evaluation                 AM-PAC OT 6 Clicks Daily Activity     Outcome Measure   Help from another person eating meals?: None Help from another person taking care of personal grooming?: A Little Help from another person toileting, which includes using toliet, bedpan, or urinal?: A Little Help from another person bathing (including washing, rinsing, drying)?: A Little Help from another person to put on and taking off regular upper body clothing?: A Little Help from another person to put on and taking off regular lower body clothing?: A Little 6 Click Score: 19    End of Session Equipment Utilized During Treatment: Gait belt;Rolling walker (2 wheels)  OT Visit Diagnosis: Unsteadiness on feet (R26.81);Muscle weakness (generalized) (M62.81);Other symptoms and signs involving cognitive function    Activity Tolerance Patient tolerated treatment well   Patient Left in chair;with call bell/phone within reach;with family/visitor present   Nurse Communication Mobility status        Time: 8983-8957 OT Time Calculation (min): 26 min  Charges: OT General Charges $OT Visit: 1 Visit OT Treatments $Self Care/Home Management : 23-37 mins  Dick Laine, OTA Acute Rehabilitation Services  Office [phone]   Jeb LITTIE Laine 08/06/2024, 12:44 PM

## 2024-08-06 NOTE — Plan of Care (Signed)

## 2024-08-06 NOTE — Plan of Care (Signed)

## 2024-08-06 NOTE — Progress Notes (Signed)
 PROGRESS NOTE  Shelby Anderson  DOB: 02-13-1956  PCP: Ozell Heron HERO, MD FMW:[id number]  DOA: 08/02/2024  LOS: 3 days  Hospital Day: 5  Brief narrative:  Shelby Anderson is a 68 y.o. 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 In the ED, patient was afebrile, hemodynamically stable, O2 sat in the 80s and required up to 4 L. Labs unremarkable Urinalysis showed hazy yellow urine with moderate leukocytes, no bacteria CT abdomen pelvis unremarkable CT head/ MRI brain ruled out acute abnormalities but showed moderately advanced chronic signal changes in the cerebral white matter and basal ganglia with progression since 2021.   Admitted to [facility]  Subjective:  Nausea and vomiting has improved, still nauseous this morning but no vomiting overnight, reports she was able to eat some solids yesterday without vomiting.     Assessment and plan:  Acute metabolic encephalopathy Multiple possible etiologies were considered. Blood alcohol level negative.  Urinalysis showed pyuria but no clear infection. Brain imagings without acute intracranial abnormality.  But with some evidence of vascular dementia, ammonia level normal. Suspected polypharmacy.  See below for adjustment in neuropsych and pain meds - B12 and TSH within normal limits  Chronic pain Opioid use disorder Fibromyalgia. PTA pain meds- oxycodone  15 mg 4 times daily, Lyrica  150 mg 3 times daily Currently both on hold.  - He is remains on as needed Dilaudid  for now, hoping to resume on oxycodone  at a lower dose I have discussed with Shelby Anderson this  is a significant dose of oxycodone  she is taking and needs to be lowered gradually.  Anxiety/depression PTA meds- Cymbalta  90 mg daily, Wellbutrin  150 mg daily, doxepin  50 mg nightly Currently continued on Cymbalta  30 mg daily and Wellbutrin  150 mg daily.  Doxepin  on hold  Persistent nausea/vomiting 8/31 CT abdomen/pelvis did not show any acute abnormality Continue Compazine  as needed Continue to monitor -Started on scheduled Reglan , seems to be helping  Hypomagnesemia Hypokalemia - Replaced  Sinus bradycardia  H/o CHF, HTN PTA meds- Toprol  25 mg daily, Farxiga  10 mg daily, Imdur  60 mg daily, lisinopril  5 mg daily, Aldactone  25 mg daily 1, hydralazine  PRN, Lasix  PRN Currently not on any scheduled CHF/HTN meds. Metoprolol  on hold as well because of persistent bradycardia to 40s and 50s.  Could be the cause of falls.  Continue to monitor on telemetry. - Continue to monitor on telemetry, - TSH within normal limit - Heart rate remains in the 50s, no pauses or heart block  Pyuria Urinalysis showed pyuria but no clear infection. Currently on empiric IV Rocephin  Recent Labs  Lab 08/02/24 2027 08/04/24 0957 08/05/24 0524 08/06/24 0432  WBC 7.0 7.7 7.5 8.3   CAD/HLD Continue Lipitor  Not on any antiplatelet or anticoagulant  H/o A-fib?? Does mention history of A-fib in the past, but I do not see it is confirmed by Shelby Anderson primary cardiologist or any previous EKGs showing A-fib, for now we will defer any management to Shelby Anderson outpatient cardiology.  Type 2 diabetes mellitus A1c 6.5 on 08/03/2024 PTA meds-glipizide  5 mg daily, metformin  500 mg twice daily, Farxiga  10 mg daily Currently on oral meds on hold Currently on SSI/Accu-Cheks Recent  Labs  Lab 08/05/24 0837 08/05/24 1216 08/05/24 1623 08/05/24 2129 08/06/24 0810  GLUCAP 131* 163* 117* 114* 144*   Hypothyroid Continue synthroid   GERD H/o esophageal stricture PPI, Pepcid    Obesity  Body mass index is 33.55 kg/m.  Patient has been advised to make an attempt to improve diet and exercise patterns to aid in weight loss.   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 Home with PT recommended    Mobility:  PT Orders: Active   PT Follow up Rec: Home Health Pt9/02/2024 1600   Goals of care   Code Status: Full Code     DVT prophylaxis:  SCDs Start: 08/03/24 0735   Antimicrobials: Currently on IV Rocephin  Fluid: None Consultants: None Family Communication: None at bedside  Status: Inpatient Level of care:  Telemetry Cardiac   Patient is from: Home Needs to continue in-hospital care: Gradually improving mental status Anticipated d/c to: Hopefully home with PT on 2 to 3 days      Diet:  Diet Order             Diet Carb Modified Fluid consistency: Thin; Room service appropriate? Yes  Diet effective now                   Scheduled Meds:  atorvastatin   80 mg Oral QHS   buPROPion   150 mg Oral Daily   calcium -vitamin D  1 tablet Oral Daily   DULoxetine   30 mg Oral Daily   famotidine   20 mg Oral BID   feeding supplement  237 mL Oral BID BM   fluticasone  furoate-vilanterol  1 puff Inhalation Daily   insulin  aspart  0-15 Units Subcutaneous TID WC   levothyroxine   75 mcg Oral QAC breakfast   methocarbamol   1,000 mg Oral TID   metoCLOPramide  (REGLAN ) injection  5 mg Intravenous Q6H   montelukast   10 mg Oral QHS    PRN meds: HYDROmorphone  (DILAUDID ) injection, ondansetron  (ZOFRAN ) IV, prochlorperazine    Infusions:   cefTRIAXone  (ROCEPHIN )  IV Stopped (08/05/24 1350)    Antimicrobials: Anti-infectives (From admission, onward)    Start     Dose/Rate Route Frequency Ordered Stop   08/03/24 1300  cefTRIAXone  (ROCEPHIN ) 1 g in sodium chloride  0.9 % 100 mL IVPB        1 g 200 mL/hr over 30 Minutes Intravenous  Every 24 hours 08/03/24 1015 08/08/24 1259   08/03/24 1030  fosfomycin (MONUROL ) packet 3 g  Status:  Discontinued        3 g Oral  Once 08/03/24 0939 08/03/24 1015   08/03/24 0430  aztreonam  (AZACTAM ) 2 g in sodium chloride  0.9 % 100 mL IVPB        2 g 200 mL/hr over 30 Minutes Intravenous  Once 08/03/24 0423 08/03/24 0609       Objective: Vitals:   08/06/24 0400 08/06/24 0812  BP: 138/84 (!) 160/83  Pulse: 61 (!) 53  Resp: 14 12  Temp: 98.4 F (36.9 C) 97.6 F (36.4 C)  SpO2: 93% 97%    Intake/Output Summary (Last 24 hours) at 08/06/2024 1121 Last data filed at 08/06/2024 0300 Gross per 24 hour  Intake 340 ml  Output 350 ml  Net -10 ml   Filed Weights   08/04/24 1940  Weight: 94.3 kg   Weight change:  Body mass index is 33.55 kg/m.   Physical Exam:  Awake Alert, Oriented X 3, frail, chronically ill-appearing, deconditioned Symmetrical Chest wall movement, Good air movement bilaterally, CTAB RRR,No Gallops,Rubs or new Murmurs, No Parasternal Heave +ve B.Sounds, Abd Soft, No tenderness, No rebound - guarding or rigidity. No Cyanosis, Clubbing or edema, No new Rash or bruise      Data Review: I have personally reviewed the laboratory data and studies available.  F/u labs ordered Unresulted Labs (From admission, onward)    None       Signed, Brayton Lye, MD [facility] 08/06/2024

## 2024-08-06 NOTE — Care Management Important Message (Signed)
 Important Message  Patient Details  Name: Shelby Anderson MRN: [id number] Date of Birth: 1956/06/18   Important Message Given:  Yes - Medicare IM     Claretta Deed 08/06/2024, 4:25 PM

## 2024-08-06 NOTE — Care Management Important Message (Signed)
 Important Message  Patient Details  Name: Shelby Anderson MRN: [id number] Date of Birth: Nov 07, 1956   Important Message Given:  Yes - Medicare IM     Claretta Deed 08/06/2024, 4:26 PM

## 2024-08-07 ENCOUNTER — Other Ambulatory Visit: Payer: Self-pay

## 2024-08-07 DIAGNOSIS — G9340 Encephalopathy, unspecified: Secondary | ICD-10-CM | POA: Diagnosis not present

## 2024-08-07 LAB — GLUCOSE, CAPILLARY
Glucose-Capillary: 150 mg/dL — ABNORMAL HIGH (ref 70–99)
Glucose-Capillary: 186 mg/dL — ABNORMAL HIGH (ref 70–99)
Glucose-Capillary: 156 mg/dL — ABNORMAL HIGH (ref 70–99)
Glucose-Capillary: 170 mg/dL — ABNORMAL HIGH (ref 70–99)

## 2024-08-07 MED ORDER — OXYCODONE HCL 5 MG PO TABS
5.0000 mg | ORAL_TABLET | ORAL | Status: DC | PRN
  Administered 2024-08-07 – 2024-08-08 (×5): 5 mg via ORAL
  Filled 2024-08-07 (×5): qty 1

## 2024-08-07 MED ORDER — HYDROCHLOROTHIAZIDE 25 MG PO TABS
25.0000 mg | ORAL_TABLET | Freq: Every day | ORAL | Status: DC
  Administered 2024-08-07 – 2024-08-08 (×2): 25 mg via ORAL
  Filled 2024-08-07 (×2): qty 1

## 2024-08-07 MED ORDER — LACTULOSE 10 GM/15ML PO SOLN
30.0000 g | Freq: Two times a day (BID) | ORAL | Status: DC
  Administered 2024-08-07 – 2024-08-08 (×2): 30 g via ORAL
  Filled 2024-08-07 (×3): qty 60

## 2024-08-07 MED ORDER — POLYETHYLENE GLYCOL 3350 17 G PO PACK
17.0000 g | PACK | Freq: Once | ORAL | Status: AC
  Administered 2024-08-07: 17 g via ORAL
  Filled 2024-08-07: qty 1

## 2024-08-07 NOTE — Plan of Care (Signed)
  Problem: Education: Goal: Knowledge of General Education information will improve Description: Including pain rating scale, medication(s)/side effects and non-pharmacologic comfort measures Outcome: Progressing   Problem: Clinical Measurements: Goal: Will remain free from infection Outcome: Progressing Goal: Respiratory complications will improve Outcome: Progressing   Problem: Activity: Goal: Risk for activity intolerance will decrease Outcome: Progressing   Problem: Coping: Goal: Level of anxiety will decrease Outcome: Progressing   Problem: Pain Managment: Goal: General experience of comfort will improve and/or be controlled Outcome: Progressing

## 2024-08-07 NOTE — TOC Progression Note (Addendum)
 Transition of Care [facility]) - Progression Note    Patient Details  Name: Shelby Anderson MRN: [id number] Date of Birth: [birth year]  Transition of Care [facility]) CM/SW Contact  Corean JAYSON Canary, RN Phone Number: 08/07/2024, 3:09 PM  Clinical Narrative:    Met with patient at bedside, she is verbalizing well and alert and oriented. Sitting up in the chair.  Discussed discharge planning. The patient has a walker that was her mothers, would like one for home. She is considering home health, list from Medicare.gov given to the patient and placed labeled in the shadow chart. She has her home CPAP at bedside, recommended talking to her MD about upgrading to nasal pillows for comfort.   She will read over the list and get back to CM tomorrow for home health. States she would like her walker ( rollator) from [employer]. Ordered this for delivery tomorrow am, patient will likely DC tomorrow.   Her husband can transport her home upon discharge  Expected Discharge Plan: Home w Home Health Services Barriers to Discharge: Continued Medical Work up               Expected Discharge Plan and Services   Discharge Planning Services: CM Consult Post Acute Care Choice: Home Health Living arrangements for the past 2 months: Single Family Home                                       Social Drivers of Health (SDOH) Interventions SDOH Screenings   Food Insecurity: No Food Insecurity (08/03/2024)  Housing: Low Risk  (08/03/2024)  Transportation Needs: No Transportation Needs (08/03/2024)  Utilities: Not At Risk (08/03/2024)  Alcohol Screen: Low Risk  (09/27/2023)  Depression (PHQ2-9): Low Risk  (07/09/2024)  Financial Resource Strain: Low Risk  (07/09/2024)  Physical Activity: Sufficiently Active (09/27/2023)  Social Connections: Unknown (08/03/2024)  Stress: No Stress Concern Present (07/09/2024)  Tobacco Use: Low Risk  (06/26/2024)  Health Literacy: Adequate Health Literacy (09/27/2023)    Readmission  Risk Interventions     No data to display

## 2024-08-07 NOTE — Progress Notes (Signed)
 PROGRESS NOTE  Shelby Anderson  DOB: 02-06-1956  PCP: Ozell Heron HERO, MD FMW:[id number]  DOA: 08/02/2024  LOS: 4 days  Hospital Day: 6  Brief narrative:  Shelby Anderson is a 68 y.o. 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 In the ED, patient was afebrile, hemodynamically stable, O2 sat in the 80s and required up to 4 L. Labs unremarkable Urinalysis showed hazy yellow urine with moderate leukocytes, no bacteria CT abdomen pelvis unremarkable CT head/ MRI brain ruled out acute abnormalities but showed moderately advanced chronic signal changes in the cerebral white matter and basal ganglia with progression since 2021.   Admitted to [facility]  Subjective:  No further nausea or vomiting, appetite has improved, reports generalized weakness, fatigue .    Assessment and plan:  Acute metabolic encephalopathy Multiple possible etiologies were considered. Blood alcohol level negative.  Urinalysis showed pyuria but no clear infection. Brain imagings without acute intracranial abnormality.  But with some evidence of vascular dementia, ammonia level normal. Suspected polypharmacy.  See below for adjustment in neuropsych and pain meds - B12 and TSH within normal limits  Chronic pain Opioid use disorder Fibromyalgia. PTA pain meds- oxycodone  15 mg 4 times daily, Lyrica  150 mg 3 times daily Currently both on hold.  - He is remains on as needed Dilaudid  for now, hoping to resume on oxycodone  at a lower dose I have discussed with her this is a significant dose of oxycodone  she is taking and needs to be  lowered gradually.  Will DC her IV Dilaudid  today and start on oxycodone  5 mg every 4 hours as needed.  Anxiety/depression PTA meds- Cymbalta  90 mg daily, Wellbutrin  150 mg daily, doxepin  50 mg nightly Currently continued on Cymbalta  30 mg daily and Wellbutrin  150 mg daily.  Doxepin  on hold  Persistent nausea/vomiting 8/31 CT abdomen/pelvis did not show any acute abnormality Continue Compazine  as needed Continue to monitor -Started on scheduled Reglan , seems to be helping - She remains with significant constipation most likely due to her opioids, will add lactulose  today.  Hypomagnesemia Hypokalemia - Replaced  Sinus bradycardia  H/o CHF, HTN PTA meds- Toprol  25 mg daily, Farxiga  10 mg daily, Imdur  60 mg daily, lisinopril  5 mg daily, Aldactone  25 mg daily 1, hydralazine  PRN, Lasix  PRN Currently not on any scheduled CHF/HTN meds. Metoprolol  on hold as well because of persistent bradycardia to 40s and 50s.  Could be the cause of falls.  Continue to monitor on telemetry. - Continue to monitor on telemetry, - TSH within normal limit -Pressure started to increase so we will start on hydrochlorothiazide  -Heart rate started to increase in the 80s, continue to monitor on telemetry   Pyuria Urinalysis showed pyuria but no clear infection. Will stop IV Rocephin  Recent Labs  Lab 08/02/24 2027 08/04/24 0957 08/05/24 0524 08/06/24 0432  WBC 7.0 7.7 7.5 8.3   CAD/HLD Continue Lipitor  Not on any antiplatelet or anticoagulant  H/o A-fib?? Does mention history of A-fib in the past, but I do not see it is confirmed by her primary cardiologist or any previous EKGs showing A-fib, for now we will defer any management to her outpatient cardiology.  Type 2 diabetes mellitus A1c 6.5 on 08/03/2024 PTA meds-glipizide  5 mg daily, metformin  500 mg twice daily, Farxiga  10 mg daily Currently on oral meds on hold Currently on SSI/Accu-Cheks Recent Labs  Lab 08/06/24 1204 08/06/24 1656  08/06/24 2125 08/07/24 0717 08/07/24 1113  GLUCAP 169* 140* 132* 150* 186*   Hypothyroid Continue synthroid   GERD H/o esophageal stricture PPI, Pepcid    Obesity  Body mass index is 33.55 kg/m. Patient has been advised to make an attempt to improve diet and exercise patterns to aid in weight loss.   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 Home with PT recommended    Mobility:  PT Orders: Active   PT Follow up Rec: Home Health Pt9/02/2024 1600   Goals of care   Code Status: Full Code     DVT prophylaxis:  SCDs Start: 08/03/24 0735   Antimicrobials: Off IV Rocephin  Fluid: None Consultants: None Family Communication: None at bedside  Status: Inpatient Level of care:  Telemetry Cardiac   Patient is from: Home Needs to continue in-hospital care: Gradually improving mental status Anticipated d/c to: Hopefully home with PT on 2 to 3 days      Diet:  Diet Order             Diet Carb Modified Fluid consistency: Thin; Room service appropriate? Yes  Diet effective now                   Scheduled Meds:  atorvastatin   80 mg Oral QHS   buPROPion   150 mg Oral Daily   calcium -vitamin D  1 tablet Oral Daily   DULoxetine   30 mg Oral Daily   famotidine   20 mg Oral BID   feeding supplement  237 mL Oral BID BM   fluticasone  furoate-vilanterol  1 puff Inhalation Daily   hydrochlorothiazide   25 mg Oral Daily   insulin  aspart  0-15 Units Subcutaneous TID WC   lactulose   30 g Oral BID   levothyroxine   75 mcg Oral QAC breakfast   methocarbamol   1,000 mg Oral TID   metoCLOPramide  (REGLAN ) injection  5 mg Intravenous Q6H   montelukast   10 mg Oral QHS   polyethylene glycol  17 g Oral Once   senna-docusate  1 tablet Oral BID    PRN meds: ondansetron  (ZOFRAN ) IV, oxyCODONE , prochlorperazine     Infusions:     Antimicrobials: Anti-infectives (From admission, onward)    Start     Dose/Rate Route Frequency Ordered Stop   08/03/24 1300  cefTRIAXone  (ROCEPHIN ) 1 g in sodium chloride  0.9 % 100 mL IVPB  Status:  Discontinued        1 g 200 mL/hr over 30 Minutes Intravenous Every 24 hours 08/03/24 1015 08/07/24 0636   08/03/24 1030  fosfomycin (MONUROL ) packet 3 g  Status:  Discontinued        3 g Oral  Once 08/03/24 0939 08/03/24 1015   08/03/24 0430  aztreonam  (AZACTAM ) 2 g in sodium chloride  0.9 % 100 mL IVPB        2 g 200 mL/hr over 30 Minutes Intravenous  Once 08/03/24 0423 08/03/24 0609       Objective: Vitals:   08/07/24 0425 08/07/24 0723  BP: (!) 141/81 (!) 161/93  Pulse: 71 (!) 59  Resp: 14 20  Temp: 97.9 F (  36.6 C) 97.6 F (36.4 C)  SpO2: 93% 93%    Intake/Output Summary (Last 24 hours) at 08/07/2024 1156 Last data filed at 08/06/2024 1700 Gross per 24 hour  Intake 0 ml  Output --  Net 0 ml   Filed Weights   08/04/24 1940  Weight: 94.3 kg   Weight change:  Body mass index is 33.55 kg/m.   Physical Exam:  Awake Alert, Oriented X 3, frail, deconditioned Symmetrical Chest wall movement, Good air movement bilaterally, CTAB RRR,No Gallops,Rubs or new Murmurs, No Parasternal Heave +ve B.Sounds, Abd Soft, No tenderness, No rebound - guarding or rigidity. No Cyanosis, Clubbing or edema, No new Rash or bruise       Data Review: I have personally reviewed the laboratory data and studies available.  F/u labs ordered Unresulted Labs (From admission, onward)    None       Signed, Brayton Lye, MD [facility] 08/07/2024

## 2024-08-07 NOTE — Progress Notes (Signed)
 Physical Therapy Treatment Patient Details Name: Shelby Anderson MRN: [id number] DOB: 07/31/1956 Today's Date: 08/07/2024   History of Present Illness 68 yo F who presented to the ED on 08/02/24 with ongoing confusion, memory changes, and falls. Admitted for workup for ongoing encephalopathy. 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 Ambulating supervision level with RW, no loss of balance 150 feet. Mod I with bed mobility. Determined rollator [phone]) would be more practical for pt due to IADL demands at home (feeding chickens outdoors.) Has a ramp to enter home. Eager to d/c. Patient will continue to benefit from skilled physical therapy services to further improve independence with functional mobility.     If plan is discharge home, recommend the following: A little help with walking and/or transfers;Assistance with cooking/housework;Assist for transportation;A little help with bathing/dressing/bathroom;Help with stairs or ramp for entrance   Can travel by private vehicle        Equipment Recommendations  Rollator (4 wheels) (*Updated)    Recommendations for Other Services       Precautions / Restrictions Precautions Precautions: Fall Recall of Precautions/Restrictions: Impaired Restrictions Weight Bearing Restrictions Per Provider Order: No     Mobility  Bed Mobility Overal bed mobility: Modified Independent             General bed mobility comments: Able to rise from bed without issues.    Transfers Overall transfer level: Needs assistance Equipment used: Rolling walker (2 wheels) Transfers: Sit to/from Stand Sit to Stand: Supervision           General transfer comment: Supervision for safety, good stablility with light support from RW upon standing. Pt feels comfortable with UE support.     Ambulation/Gait Ambulation/Gait assistance: Supervision Gait Distance (Feet): 150 Feet Assistive device: Rolling walker (2 wheels) Gait Pattern/deviations: Step-through pattern, Decreased stride length Gait velocity: decreased Gait velocity interpretation: 1.31 - 2.62 ft/sec, indicative of limited community ambulator   General Gait Details: Grossly stable with light UE support from RW. No overt instability or LOB noted during bout. Speed slightly reduced. States she feels closer to baseline but prefers having UE support for security to maximize safety and confidence. Educated on device and difference between [employer] which she prefers based on IADL demands at home.   Stairs             Wheelchair Mobility     Tilt Bed    Modified Rankin (Stroke Patients Only)       Balance Overall balance assessment: History of Falls, Needs assistance Sitting-balance support: No upper extremity supported, Feet supported Sitting balance-Leahy Scale: Good     Standing balance support: No upper extremity supported, During functional activity Standing balance-Leahy Scale: Fair Standing balance comment: More stable with light UE support                            Communication Communication Communication: No apparent difficulties  Cognition Arousal: Alert Behavior During Therapy: WFL for tasks assessed/performed   PT - Cognitive impairments: No apparent impairments                         Following commands: Intact      Cueing Cueing Techniques: Verbal cues  Exercises  General Comments        Pertinent Vitals/Pain Pain Assessment Pain Assessment: No/denies pain Pain Intervention(s): Premedicated before session    Home Living                          Prior Function            PT Goals (current goals can now be found in the care plan section) Acute Rehab PT Goals Patient Stated Goal: get better PT Goal Formulation: With  patient/family Time For Goal Achievement: 08/17/24 Potential to Achieve Goals: Good Progress towards PT goals: Progressing toward goals    Frequency    Min 2X/week      PT Plan      Co-evaluation              AM-PAC PT 6 Clicks Mobility   Outcome Measure  Help needed turning from your back to your side while in a flat bed without using bedrails?: None Help needed moving from lying on your back to sitting on the side of a flat bed without using bedrails?: None Help needed moving to and from a bed to a chair (including a wheelchair)?: A Little Help needed standing up from a chair using your arms (e.g., wheelchair or bedside chair)?: A Little Help needed to walk in hospital room?: A Little Help needed climbing 3-5 steps with a railing? : A Little 6 Click Score: 20    End of Session Equipment Utilized During Treatment: Gait belt Activity Tolerance: Patient tolerated treatment well Patient left: with call bell/phone within reach;in chair;with chair alarm set   PT Visit Diagnosis: Unsteadiness on feet (R26.81);Difficulty in walking, not elsewhere classified (R26.2);Muscle weakness (generalized) (M62.81);Repeated falls (R29.6);Other abnormalities of gait and mobility (R26.89)     Time: 1256-1310 PT Time Calculation (min) (ACUTE ONLY): 14 min  Charges:    $Gait Training: 8-22 mins PT General Charges $$ ACUTE PT VISIT: 1 Visit                     Leontine Roads, PT, DPT [facility]  Rehabilitation Services Physical Therapist Office: [phone] Website: [email or web address]    Leontine GORMAN Roads 08/07/2024, 2:38 PM

## 2024-08-08 ENCOUNTER — Other Ambulatory Visit (HOSPITAL_COMMUNITY): Payer: Self-pay

## 2024-08-08 DIAGNOSIS — G9340 Encephalopathy, unspecified: Secondary | ICD-10-CM | POA: Diagnosis not present

## 2024-08-08 LAB — CBC
HCT: 38.5 % (ref 36.0–46.0)
Hemoglobin: 12.6 g/dL (ref 12.0–15.0)
MCH: 28.6 pg (ref 26.0–34.0)
MCHC: 32.7 g/dL (ref 30.0–36.0)
MCV: 87.5 fL (ref 80.0–100.0)
Platelets: 215 K/uL (ref 150–400)
RBC: 4.4 MIL/uL (ref 3.87–5.11)
RDW: 15.7 % — ABNORMAL HIGH (ref 11.5–15.5)
WBC: 7.5 K/uL (ref 4.0–10.5)
nRBC: 0 % (ref 0.0–0.2)

## 2024-08-08 LAB — CULTURE, BLOOD (ROUTINE X 2)
Culture: NO GROWTH
Culture: NO GROWTH
Special Requests: ADEQUATE

## 2024-08-08 LAB — BASIC METABOLIC PANEL WITH GFR
Anion gap: 9 (ref 5–15)
BUN: 13 mg/dL (ref 8–23)
CO2: 29 mmol/L (ref 22–32)
Calcium: 9.4 mg/dL (ref 8.9–10.3)
Chloride: 100 mmol/L (ref 98–111)
Creatinine, Ser: 0.73 mg/dL (ref 0.44–1.00)
GFR, Estimated: >60 mL/min (ref >60)
Glucose, Bld: 148 mg/dL — ABNORMAL HIGH (ref 70–99)
Potassium: 3.5 mmol/L (ref 3.5–5.1)
Sodium: 138 mmol/L (ref 135–145)

## 2024-08-08 LAB — GLUCOSE, CAPILLARY: Glucose-Capillary: 139 mg/dL — ABNORMAL HIGH (ref 70–99)

## 2024-08-08 LAB — MAGNESIUM: Magnesium: 1.6 mg/dL — ABNORMAL LOW (ref 1.7–2.4)

## 2024-08-08 MED ORDER — OXYCODONE HCL 15 MG PO TABS: 7.5000 mg | ORAL_TABLET | Freq: Two times a day (BID) | ORAL | Status: AC | PRN

## 2024-08-08 MED ORDER — SENNOSIDES-DOCUSATE SODIUM 8.6-50 MG PO TABS: 1.0000 | ORAL_TABLET | Freq: Two times a day (BID) | ORAL | Status: AC

## 2024-08-08 MED ORDER — DULOXETINE HCL 30 MG PO CPEP
30.0000 mg | ORAL_CAPSULE | Freq: Every day | ORAL | 0 refills | Status: DC
  Filled 2024-08-08: qty 30, 30d supply, fill #0

## 2024-08-08 NOTE — Discharge Instructions (Signed)
 Follow with Primary MD Ozell Heron HERO, MD in 7 days   Get CBC, CMP,  checked  by Primary MD next visit.    Activity: As tolerated with Full fall precautions use walker/cane & assistance as needed   Disposition Home    Diet: Heart Healthy .  For Heart failure patients - Check your Weight same time everyday, if you gain over 2 pounds, or you develop in leg swelling, experience more shortness of breath or chest pain, call your Primary MD immediately. Follow Cardiac Low Salt Diet and 1.5 lit/day fluid restriction.   On your next visit with your primary care physician please Get Medicines reviewed and adjusted.   Please request your Prim.MD to go over all Hospital Tests and Procedure/Radiological results at the follow up, please get all Hospital records sent to your Prim MD by signing hospital release before you go home.   If you experience worsening of your admission symptoms, develop shortness of breath, life threatening emergency, suicidal or homicidal thoughts you must seek medical attention immediately by calling 911 or calling your MD immediately  if symptoms less severe.  You Must read complete instructions/literature along with all the possible adverse reactions/side effects for all the Medicines you take and that have been prescribed to you. Take any new Medicines after you have completely understood and accpet all the possible adverse reactions/side effects.   Do not drive, operating heavy machinery, perform activities at heights, swimming or participation in water  activities or provide baby sitting services if your were admitted for syncope or siezures until you have seen by Primary MD or a Neurologist and advised to do so again.  Do not drive when taking Pain medications.    Do not take more than prescribed Pain, Sleep and Anxiety Medications  Special Instructions: If you have smoked or chewed Tobacco  in the last 2 yrs please stop smoking, stop any regular Alcohol  and or  any Recreational drug use.  Wear Seat belts while driving.   Please note  You were cared for by a hospitalist during your hospital stay. If you have any questions about your discharge medications or the care you received while you were in the hospital after you are discharged, you can call the unit and asked to speak with the hospitalist on call if the hospitalist that took care of you is not available. Once you are discharged, your primary care physician will handle any further medical issues. Please note that NO REFILLS for any discharge medications will be authorized once you are discharged, as it is imperative that you return to your primary care physician (or establish a relationship with a primary care physician if you do not have one) for your aftercare needs so that they can reassess your need for medications and monitor your lab values.

## 2024-08-08 NOTE — TOC Transition Note (Signed)
 Transition of Care [facility]) - Discharge Note   Patient Details  Name: Shelby Anderson MRN: [id number] Date of Birth: 1956-09-08  Transition of Care [facility] CM/SW Contact:  Marval Gell, RN Phone Number: 08/08/2024, 10:22 AM   Clinical Narrative:     Spoke w pt at bedside. She would like rollator delivered to her home, [employer] to deliver to the house.  She would like [facility]. They were able to accept and reach out to patient in day or so to set up time to come see her. No other TOC needs identified   Final next level of care: Home w Home Health Services Barriers to Discharge: No Barriers Identified   Patient Goals and CMS Choice Patient states their goals for this hospitalization and ongoing recovery are:: to go home CMS Medicare.gov Compare Post Acute Care list provided to:: Patient Choice offered to / list presented to : Patient      Discharge Placement                       Discharge Plan and Services Additional resources added to the After Visit Summary for     Discharge Planning Services: CM Consult Post Acute Care Choice: Home Health          DME Arranged: Walker rolling with seat DME Agency: [employer] Date DME Agency Contacted: 08/08/24 Time DME Agency Contacted: 1021 Representative spoke with at DME Agency: [place] [facility] Arranged: PT, OT HH Agency: [facility] Date HH Agency Contacted: 08/08/24 Time HH Agency Contacted: 1022 Representative spoke with at [facility] Agency: [place]  Social Drivers of Health (SDOH) Interventions SDOH Screenings   Food Insecurity: No Food Insecurity (08/03/2024)  Housing: Low Risk  (08/03/2024)  Transportation Needs: No Transportation Needs (08/03/2024)  Utilities: Not At Risk (08/03/2024)  Alcohol Screen: Low Risk  (09/27/2023)  Depression (PHQ2-9): Low Risk  (07/09/2024)  Financial Resource Strain: Low Risk  (07/09/2024)  Physical Activity: Sufficiently Active (09/27/2023)  Social Connections: Unknown  (08/03/2024)  Stress: No Stress Concern Present (07/09/2024)  Tobacco Use: Low Risk  (06/26/2024)  Health Literacy: Adequate Health Literacy (09/27/2023)     Readmission Risk Interventions     No data to display

## 2024-08-08 NOTE — Plan of Care (Signed)
  Problem: Health Behavior/Discharge Planning: Goal: Ability to manage health-related needs will improve Outcome: Progressing   Problem: Clinical Measurements: Goal: Will remain free from infection Outcome: Progressing   Problem: Activity: Goal: Risk for activity intolerance will decrease Outcome: Progressing   Problem: Pain Managment: Goal: General experience of comfort will improve and/or be controlled Outcome: Progressing

## 2024-08-08 NOTE — Discharge Summary (Signed)
 Physician Discharge Summary  Shelby Anderson FMW:[id number] DOB: [birth year] DOA: 08/02/2024  PCP: Ozell Heron HERO, MD  Admit date: 08/02/2024 Discharge date: 08/08/2024  Admitted From: (Home) Disposition:  (Home )  Recommendations for Outpatient Follow-up:  Follow up with PCP in 1-2 weeks Please obtain BMP/CBC in one week Please continue tapering pain regimen and her psychotropic medications  Home Health: (YES)  Diet recommendation: Heart Healthy / Carb Modified  Brief/Interim Summary: Shelby Anderson is a 68 y.o. 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 Labs unremarkable,Urinalysis showed hazy yellow urine with moderate leukocytes, no bacteria CT abdomen pelvis unremarkable CT head/ MRI brain ruled out acute abnormalities but showed moderately advanced chronic signal changes in the cerebral white matter and basal ganglia with progression since 2021.  She was admitted for further management.   Acute metabolic encephalopathy -Resolved, mentation back to baseline --Is most likely in the setting of polypharmacy, given her significant psychotropic medications, and significant pain medicine, her regimen has been adjusted, please see discussion below.   -Blood alcohol level negative.   -Brain imagings without acute intracranial abnormality.  But with some evidence of advanced chronic signal changes and cerebral white matter atrophy. - ammonia level normal. - B12 and TSH within normal limits   Chronic  pain Opioid use disorder Fibromyalgia. PTA pain meds- oxycodone  15 mg 4 times daily, Lyrica  150 mg 3 times daily. -These meds has been held initially, and he was kept on low-dose as   Anxiety/depression PTA meds- Cymbalta  90 mg daily, Wellbutrin  150 mg daily, doxepin  50 mg nightly Currently continued on Cymbalta  30 mg daily and Wellbutrin  150 mg daily.  Doxepin  on hold  Patient on multiple medications which will contribute to her altered mental status and encephalopathy, I have discussed with her at length multiple times we need to minimize these medications, which she was in agreement, and certainly she is feeling a lot better at time of discharge when adjustment has been made, adjustment as follows -I have instructed her to decrease her oxycodone  intake, to take 7.5 mg p.o. twice daily as needed and instead of scheduled 15 mg every 4 hours. -Continue with current dose Wellbutrin . -Cymbalta  dose has been decreased from 60 mg to 30 mg. - Doxepin  has been discontinued. - Lyrica  has been discontinued   Persistent nausea/vomiting 8/31 CT abdomen/pelvis did not show any acute abnormality -Started on scheduled Reglan , which did help - Has significant constipation, which required aggressive laxative regimen, this has resolved, . - No further nausea or vomiting, I think a lot of her nausea vomiting comes from chronic high-dose narcotics use, see above discussion, as well as she was instructed to use laxative as needed to ensure at least 1 bowel movement per day.  Hypomagnesemia Hypokalemia - Replaced   Sinus bradycardia - Patient with significant bradycardia heart rate in the 40s initially, no arrhythmias, no heart block or pauses, her beta-blockers has been discontinued, currently heart rate in the 60s and 70s.   H/o CHF, HTN PTA meds- Toprol  25 mg daily, Farxiga  10 mg daily,  Imdur  60 mg daily, lisinopril  5 mg daily, Aldactone  25 mg daily 1, hydralazine  PRN, Lasix  PRN -Metoprolol  on hold as  well because of persistent bradycardia to 40s and 50s.  Could be the cause of falls.  - TSH within normal limit - All meds has been held initially blood pressure started to increase, so we will continue only with Imdur  at this point. -Continue with as needed Lasix , will discontinue Aldactone    Pyuria, UTI ruled out Urinalysis showed pyuria but no clear infection.   CAD/HLD Continue Lipitor  Not on any antiplatelet or anticoagulant   H/o A-fib?? Does mention history of A-fib in the past, but I do not see it is confirmed by her primary cardiologist or any previous EKGs showing A-fib, for now we will defer any management to her outpatient cardiology.   Type 2 diabetes mellitus A1c 6.5 on 08/03/2024 PTA meds-glipizide  5 mg daily, metformin  500 mg twice daily, Farxiga  10 mg daily Resume on discharge  Hypothyroid Continue synthroid    GERD H/o esophageal stricture PPI, Pepcid    Obesity  Body mass index is 33.55 kg/m. Patient has been advised to make an attempt to improve diet and exercise patterns to aid in weight loss.   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 Home with PT recommended      Discharge Diagnoses:  Principal Problem:   Acute encephalopathy    Discharge Instructions  Discharge Instructions     Diet - low sodium heart healthy   Complete by: As directed    Discharge instructions   Complete by: As directed    Follow with Primary MD Ozell Heron HERO, MD in 7 days   Get CBC, CMP,  checked  by Primary MD next visit.    Activity: As tolerated with Full fall precautions use walker/cane & assistance as needed   Disposition Home    Diet: Heart Healthy .  For Heart failure patients - Check your Weight same time everyday, if you gain over 2 pounds, or you develop in leg swelling,  experience more shortness of breath or chest pain, call your Primary MD immediately. Follow Cardiac Low Salt Diet and 1.5 lit/day fluid restriction.   On your next visit with your primary care physician please Get Medicines reviewed and adjusted.   Please request your Prim.MD to go over all Hospital Tests and Procedure/Radiological results at the follow up, please get all Hospital records sent to your Prim MD by signing hospital release before you go home.   If you experience worsening of your admission symptoms, develop shortness of breath, life threatening emergency, suicidal or homicidal thoughts you must seek medical attention immediately by calling 911 or calling your MD immediately  if symptoms less severe.  You Must read complete instructions/literature along with all the possible adverse reactions/side effects for all the Medicines you take and that have been prescribed to you. Take any new Medicines after you have completely understood and accpet all the possible adverse reactions/side effects.   Do not drive, operating heavy machinery, perform activities at heights, swimming or participation in water  activities or provide baby sitting services if your were admitted for syncope or siezures until you have seen by Primary MD or a Neurologist and advised to do so again.  Do not drive when taking Pain medications.    Do  not take more than prescribed Pain, Sleep and Anxiety Medications  Special Instructions: If you have smoked or chewed Tobacco  in the last 2 yrs please stop smoking, stop any regular Alcohol  and or any Recreational drug use.  Wear Seat belts while driving.   Please note  You were cared for by a hospitalist during your hospital stay. If you have any questions about your discharge medications or the care you received while you were in the hospital after you are discharged, you can call the unit and asked to speak with the hospitalist on call if the hospitalist that took  care of you is not available. Once you are discharged, your primary care physician will handle any further medical issues. Please note that NO REFILLS for any discharge medications will be authorized once you are discharged, as it is imperative that you return to your primary care physician (or establish a relationship with a primary care physician if you do not have one) for your aftercare needs so that they can reassess your need for medications and monitor your lab values.   Increase activity slowly   Complete by: As directed       Allergies as of 08/08/2024       Reactions   Lodine [etodolac] Anaphylaxis, Hives, Swelling   Oxycontin  [oxycodone  Hcl] Anaphylaxis   hives, trouble breathing, tongue swelling (Only Oxycontin ) Tolerates plain oxycodone .   Penicillins Anaphylaxis   Told by a surgeon never to take it again.   Aspirin  Other (See Comments)   High-dose caused GI Bleeds   Darvocet [propoxyphene N-acetaminophen ] Hives   Nitroglycerin  Other (See Comments)   IV-BP drops dramatically Can take po   Propoxyphene Hives   Tramadol Hives, Itching   Valium Other (See Comments)   Circulation problems. Legs turned black.        Medication List     STOP taking these medications    doxepin  50 MG capsule Commonly known as: SINEQUAN    doxycycline  100 MG capsule Commonly known as: VIBRAMYCIN    hydrALAZINE  25 MG tablet Commonly known as: APRESOLINE    lisinopril  5 MG tablet Commonly known as: ZESTRIL    metoprolol  succinate 25 MG 24 hr tablet Commonly known as: TOPROL -XL   pregabalin  150 MG capsule Commonly known as: LYRICA    promethazine  25 MG tablet Commonly known as: PHENERGAN    spironolactone  25 MG tablet Commonly known as: ALDACTONE    tiZANidine  4 MG tablet Commonly known as: ZANAFLEX        TAKE these medications    albuterol  (2.5 MG/3ML) 0.083% nebulizer solution Commonly known as: PROVENTIL  Inhale 3 mLs (2.5 mg total) by nebulization every 6 (six) hours  as needed for wheezing or shortness of breath (J45.40).   albuterol  108 (90 Base) MCG/ACT inhaler Commonly known as: VENTOLIN  HFA Inhale 1-2 puffs into the lungs every 4 (four) hours as needed for wheezing or shortness of breath.   atorvastatin  80 MG tablet Commonly known as: LIPITOR  Take 1 tablet (80 mg total) by mouth at bedtime.   buPROPion  150 MG 24 hr tablet Commonly known as: WELLBUTRIN  XL Take 1 tablet (150 mg total) by mouth daily.   Calcium  Carbonate-Vitamin D 600-200 MG-UNIT Tabs Take 1 tablet by mouth daily.   CINNAMON PO Take 1,000 mg 2 (two) times daily by mouth.   dapagliflozin  propanediol 10 MG Tabs tablet Commonly known as: Farxiga  Take 1 tablet (10 mg total) by mouth daily before breakfast.   DULoxetine  30 MG capsule Commonly known as: CYMBALTA  Take 1 capsule (  30 mg total) by mouth daily. Start taking on: August 09, 2024 What changed: Another medication with the same name was removed. Continue taking this medication, and follow the directions you see here.   famotidine  20 MG tablet Commonly known as: PEPCID  Take 1 tablet (20 mg total) by mouth 2 (two) times daily.   fluticasone  50 MCG/ACT nasal spray Commonly known as: FLONASE  Place 1 spray into both nostrils daily as needed for allergies.   furosemide  20 MG tablet Commonly known as: LASIX  Take 1 tablet (20 mg total) by mouth daily as needed for ankle swelling.   glipiZIDE  5 MG 24 hr tablet Commonly known as: GLUCOTROL  XL Take 1 tablet (5 mg total) by mouth every morning.   GLUCOS-CHONDROIT-MSM COMPLEX PO Take 2 tablets by mouth daily.   isosorbide  mononitrate 60 MG 24 hr tablet Commonly known as: IMDUR  Take 1 tablet (60 mg total) by mouth every morning.   levothyroxine  75 MCG tablet Commonly known as: SYNTHROID  Take 1 tablet (75 mcg total) by mouth daily before breakfast.   metFORMIN  500 MG tablet Commonly known as: GLUCOPHAGE  Take 1 tablet (500 mg total) by mouth 2 (two) times daily with  a meal.   mometasone  0.1 % cream Commonly known as: ELOCON  Apply 1 Application topically daily as needed (for redness/raw). Apply topically behind ears   montelukast  10 MG tablet Commonly known as: SINGULAIR  Take 1 tablet (10 mg total) by mouth at bedtime.   multivitamin with minerals Tabs tablet Take 1 tablet by mouth daily.   NexIUM  40 MG capsule Generic drug: esomeprazole  Take 1 capsule (40 mg total) by mouth in the morning and at bedtime.   nitroGLYCERIN  0.4 MG SL tablet Commonly known as: NITROSTAT  Place 1 tablet under the tongue every 5 minutes as needed for chest pain up to 3 times. If no relief, call 911.   Nurtec 75 MG Tbdp Generic drug: Rimegepant Sulfate Take 1 each by mouth daily as needed. What changed:  reasons to take this additional instructions   ondansetron  4 MG tablet Commonly known as: ZOFRAN  Take 1 tablet (4 mg total) by mouth every 8 (eight) hours as needed for nausea or vomiting.   oxyCODONE  15 MG immediate release tablet Commonly known as: ROXICODONE  Take 0.5 tablets (7.5 mg total) by mouth every 12 (twelve) hours as needed for pain. What changed:  how much to take how to take this when to take this reasons to take this   potassium chloride  10 MEQ tablet Commonly known as: KLOR-CON  Take 2 tablets (20 mEq total) by mouth every morning. What changed:  when to take this reasons to take this   senna-docusate 8.6-50 MG tablet Commonly known as: Senokot-S Take 1 tablet by mouth 2 (two) times daily.   Symbicort  80-4.5 MCG/ACT inhaler Generic drug: budesonide -formoterol  Inhale 2 puffs into the lungs every morning and another 2 puffs 12 hours later.   vitamin B-12 500 MCG tablet Commonly known as: CYANOCOBALAMIN  Take 500 mcg by mouth daily.   Vitamin D3 50 MCG (2000 UT) capsule Take 2,000 Units by mouth daily.   vitamin E 200 UNIT capsule Take 200 Units by mouth daily.               Durable Medical Equipment  (From admission,  onward)           Start     Ordered   08/07/24 1528  For home use only DME 4 wheeled rolling walker with seat  Once  Question:  Patient needs a walker to treat with the following condition  Answer:  Weakness   08/07/24 1528            Allergies  Allergen Reactions   Lodine [Etodolac] Anaphylaxis, Hives and Swelling   Oxycontin  [Oxycodone  Hcl] Anaphylaxis    hives, trouble breathing, tongue swelling (Only Oxycontin ) Tolerates plain oxycodone .   Penicillins Anaphylaxis    Told by a surgeon never to take it again.   Aspirin  Other (See Comments)    High-dose caused GI Bleeds   Darvocet [Propoxyphene N-Acetaminophen ] Hives   Nitroglycerin  Other (See Comments)    IV-BP drops dramatically Can take po   Propoxyphene Hives   Tramadol Hives and Itching   Valium Other (See Comments)    Circulation problems. Legs turned black.    Consultations: none   Procedures/Studies: MR BRAIN WO CONTRAST Result Date: 08/03/2024 CLINICAL DATA:  68 year old female with altered mental status. EXAM: MRI HEAD WITHOUT CONTRAST TECHNIQUE: Multiplanar, multiecho pulse sequences of the brain and surrounding structures were obtained without intravenous contrast. COMPARISON:  Head CT this morning.  Brain MRI 02/04/2020. FINDINGS: Brain: No restricted diffusion to suggest acute infarction. No midline shift, mass effect, evidence of mass lesion, ventriculomegaly, extra-axial collection or acute intracranial hemorrhage. Cervicomedullary junction and pituitary are within normal limits. Chronic widely scattered cerebral white matter T2 and FLAIR hyperintensity in both hemispheres, mildly progressed since 2021 bilaterally, the extent is moderate for age. Similar mild progression of moderate T2 and FLAIR heterogeneity now in the bilateral basal ganglia. Thalami, brainstem, and cerebellum appear spared. No cortical encephalomalacia or chronic cerebral blood products identified. Vascular: Major intracranial  vascular flow voids are stable since 2021, preserved. 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 Visualized bone marrow signal is within normal limits. Negative visible cervical spine otherwise. Sinuses/Orbits: Stable and negative. Other: Mastoids are clear. Visible internal auditory structures appear normal. Negative visible scalp and face. IMPRESSION: 1. No acute intracranial abnormality. 2. Moderately advanced chronic signal changes in the cerebral white matter and basal ganglia with progression since 2021. These are nonspecific but most commonly due to small vessel disease. Electronically Signed   By: VEAR Hurst M.D.   On: 08/03/2024 09:12   CT Head Wo Contrast Result Date: 08/03/2024 CLINICAL DATA:  Mental status change, unknown cause EXAM: CT HEAD WITHOUT CONTRAST TECHNIQUE: Contiguous axial images were obtained from the base of the skull through the vertex without intravenous contrast. RADIATION DOSE REDUCTION: This exam was performed according to the departmental dose-optimization program which includes automated exposure control, adjustment of the mA and/or kV according to patient size and/or use of iterative reconstruction technique. COMPARISON:  MRI head February 04, 2020. FINDINGS: Brain: No evidence of acute infarction, hemorrhage, hydrocephalus, extra-axial collection or mass lesion/mass effect. Moderate patchy white matter hypodensities are nonspecific but compatible with chronic microvascular ischemic disease. Mildly motion limited study. Vascular: No hyperdense vessel or unexpected calcification. Skull: Normal. Negative for fracture or focal lesion. Sinuses/Orbits: No acute finding. IMPRESSION: 1. No evidence of acute intracranial abnormality. 2. Moderate chronic microvascular ischemic disease. Electronically Signed   By: Gilmore GORMAN Molt M.D.   On: 08/03/2024 05:51   CT ABDOMEN PELVIS W CONTRAST Result Date:  08/02/2024 CLINICAL DATA:  Acute abdominal pain. EXAM: CT ABDOMEN AND PELVIS WITH CONTRAST TECHNIQUE: Multidetector CT imaging of the abdomen and pelvis was performed using the standard protocol following bolus administration of intravenous contrast. RADIATION DOSE REDUCTION: This exam was performed  according to the departmental dose-optimization program which includes automated exposure control, adjustment of the mA and/or kV according to patient size and/or use of iterative reconstruction technique. CONTRAST:  75mL OMNIPAQUE  IOHEXOL  350 MG/ML SOLN COMPARISON:  CT chest abdomen and pelvis 07/31/2023 FINDINGS: Lower chest: No acute abnormality. Hepatobiliary: No focal liver abnormality is seen. No gallstones, gallbladder wall thickening, or biliary dilatation. Pancreas: Unremarkable. No pancreatic ductal dilatation or surrounding inflammatory changes. Spleen: Normal in size without focal abnormality. Adrenals/Urinary Tract: Adrenal glands are unremarkable. Kidneys are normal, without renal calculi, focal lesion, or hydronephrosis. Bladder is unremarkable. Stomach/Bowel: There is a small hiatal hernia. Stomach is within normal limits. Appendix appears normal. No evidence of bowel wall thickening, distention, or inflammatory changes. Vascular/Lymphatic: Aortic atherosclerosis. No enlarged abdominal or pelvic lymph nodes. Reproductive: Posterior uterine fibroid is present measuring 1 cm. Adnexa are within normal limits. Other: There small fat containing bilateral inguinal hernias. There is no ascites. Musculoskeletal: No acute or significant osseous findings. There surgical clips in the left breast with scarring. IMPRESSION: 1. No acute localizing process in the abdomen or pelvis. 2. Small hiatal hernia. 3. Small uterine fibroid. Aortic Atherosclerosis (ICD10-I70.0). Electronically Signed   By: Greig Pique M.D.   On: 08/02/2024 23:56   DG Chest 2 View Result Date: 08/02/2024 CLINICAL DATA:  Dyspnea EXAM: CHEST - 2  VIEW COMPARISON:  11/13/2023 FINDINGS: Lungs are well expanded, symmetric, and clear. No pneumothorax or pleural effusion. Cardiac size within normal limits. Pulmonary vascularity is normal. Implanted loop recorder noted. Osseous structures are age-appropriate. No acute bone abnormality. IMPRESSION: No active cardiopulmonary disease. Electronically Signed   By: Dorethia Molt M.D.   On: 08/02/2024 23:34     Subjective:  She denies any complaints today, she had BM x 2 yesterday, no nausea, no vomiting.  Discharge Exam: Vitals:   08/08/24 0300 08/08/24 0752  BP: 134/79 124/75  Pulse: 75 64  Resp: 11 15  Temp: 98.4 F (36.9 C) 98.5 F (36.9 C)  SpO2: 94% 94%   Vitals:   08/07/24 1945 08/07/24 2312 08/08/24 0300 08/08/24 0752  BP: (!) 160/91 126/81 134/79 124/75  Pulse: 68 80 75 64  Resp: 17 15 11 15   Temp: 97.9 F (36.6 C) 98.2 F (36.8 C) 98.4 F (36.9 C) 98.5 F (36.9 C)  TempSrc: Oral Oral Oral Oral  SpO2: 95% 93% 94% 94%  Weight:      Height:        General: Pt is alert, awake, not in acute distress Cardiovascular: RRR, S1/S2 +, no rubs, no gallops Respiratory: CTA bilaterally, no wheezing, no rhonchi Abdominal: Soft, NT, ND, bowel sounds + Extremities: no edema, no cyanosis    The results of significant diagnostics from this hospitalization (including imaging, microbiology, ancillary and laboratory) are listed below for reference.     Microbiology: Recent Results (from the past 240 hours)  Culture, blood (Routine X 2) w Reflex to ID Panel     Status: None (Preliminary result)   Collection Time: 08/03/24 12:07 PM   Specimen: BLOOD  Result Value Ref Range Status   Specimen Description BLOOD SITE NOT SPECIFIED  Final   Special Requests   Final    BOTTLES DRAWN AEROBIC AND ANAEROBIC Blood Culture results may not be optimal due to an inadequate volume of blood received in culture bottles   Culture   Final    NO GROWTH 4 DAYS Performed at [facility],  [address], [place], [place] [address]  Report Status PENDING  Incomplete  Culture, blood (Routine X 2) w Reflex to ID Panel     Status: None (Preliminary result)   Collection Time: 08/03/24 12:08 PM   Specimen: BLOOD  Result Value Ref Range Status   Specimen Description BLOOD SITE NOT SPECIFIED  Final   Special Requests   Final    BOTTLES DRAWN AEROBIC AND ANAEROBIC Blood Culture adequate volume   Culture   Final    NO GROWTH 4 DAYS Performed at [facility], [address] [place], [place] [address]    Report Status PENDING  Incomplete     Labs: BNP (last 3 results) Recent Labs    08/02/24 2027  BNP 138.1*   Basic Metabolic Panel: Recent Labs  Lab 08/02/24 2027 08/02/24 2341 08/04/24 0957 08/05/24 0524 08/05/24 1253 08/06/24 0432 08/08/24 0526  NA 143 141 143 140  --  143 138  K 3.8 3.5 3.4* 3.3*  --  3.5 3.5  CL 107  --  107 106  --  106 100  CO2 19*  --  25 27  --  23 29  GLUCOSE 184*  --  130* 143*  --  146* 148*  BUN 18  --  25* 22  --  19 13  CREATININE 1.12*  --  0.89 0.86  --  0.72 0.73  CALCIUM  10.1  --  9.1 9.0  --  9.1 9.4  MG  --   --   --   --  1.6* 1.6* 1.6*  PHOS  --   --   --   --  3.5 2.9  --    Liver Function Tests: No results for input(s): AST, ALT, ALKPHOS, BILITOT, PROT, ALBUMIN in the last 168 hours. No results for input(s): LIPASE, AMYLASE in the last 168 hours. Recent Labs  Lab 08/04/24 0957  AMMONIA 17   CBC: Recent Labs  Lab 08/02/24 2027 08/02/24 2341 08/04/24 0957 08/05/24 0524 08/06/24 0432 08/08/24 0526  WBC 7.0  --  7.7 7.5 8.3 7.5  NEUTROABS  --   --   --  5.6  --   --   HGB 14.6 12.9 12.1 11.9* 12.5 12.6  HCT 44.2 38.0 37.7 37.5 38.9 38.5  MCV 85.7  --  88.9 89.3 88.2 87.5  PLT 282  --  235 200 215 215   Cardiac Enzymes: No results for input(s): CKTOTAL, CKMB, CKMBINDEX, TROPONINI in the last 168 hours. BNP: Invalid input(s): POCBNP CBG: Recent Labs  Lab 08/07/24 0717  08/07/24 1113 08/07/24 1624 08/07/24 2116 08/08/24 0751  GLUCAP 150* 186* 156* 170* 139*   D-Dimer No results for input(s): DDIMER in the last 72 hours. Hgb A1c No results for input(s): HGBA1C in the last 72 hours. Lipid Profile No results for input(s): CHOL, HDL, LDLCALC, TRIG, CHOLHDL, LDLDIRECT in the last 72 hours. Thyroid  function studies Recent Labs    08/05/24 1253  TSH 1.722   Anemia work up Recent Labs    08/06/24 0432  VITAMINB12 1,187*  FOLATE >20.0   Urinalysis    Component Value Date/Time   COLORURINE YELLOW 08/03/2024 0018   APPEARANCEUR HAZY (A) 08/03/2024 0018   LABSPEC 1.040 (H) 08/03/2024 0018   PHURINE 7.0 08/03/2024 0018   GLUCOSEU >=500 (A) 08/03/2024 0018   GLUCOSEU >=1000 (A) 11/08/2023 1552   HGBUR NEGATIVE 08/03/2024 0018   BILIRUBINUR NEGATIVE 08/03/2024 0018   BILIRUBINUR n 10/27/2014 1118   KETONESUR 20 (A) 08/03/2024 0018   PROTEINUR 30 (  A) 08/03/2024 0018   UROBILINOGEN 0.2 11/08/2023 1552   NITRITE NEGATIVE 08/03/2024 0018   LEUKOCYTESUR MODERATE (A) 08/03/2024 0018   Sepsis Labs Recent Labs  Lab 08/04/24 0957 08/05/24 0524 08/06/24 0432 08/08/24 0526  WBC 7.7 7.5 8.3 7.5   Microbiology Recent Results (from the past 240 hours)  Culture, blood (Routine X 2) w Reflex to ID Panel     Status: None (Preliminary result)   Collection Time: 08/03/24 12:07 PM   Specimen: BLOOD  Result Value Ref Range Status   Specimen Description BLOOD SITE NOT SPECIFIED  Final   Special Requests   Final    BOTTLES DRAWN AEROBIC AND ANAEROBIC Blood Culture results may not be optimal due to an inadequate volume of blood received in culture bottles   Culture   Final    NO GROWTH 4 DAYS Performed at [facility], [address], [place], [place] [address]    Report Status PENDING  Incomplete  Culture, blood (Routine X 2) w Reflex to ID Panel     Status: None (Preliminary result)   Collection Time: 08/03/24 12:08 PM    Specimen: BLOOD  Result Value Ref Range Status   Specimen Description BLOOD SITE NOT SPECIFIED  Final   Special Requests   Final    BOTTLES DRAWN AEROBIC AND ANAEROBIC Blood Culture adequate volume   Culture   Final    NO GROWTH 4 DAYS Performed at [facility], [address] [place], [place] [address]    Report Status PENDING  Incomplete     Time coordinating discharge: Over 30 minutes  SIGNED:   Brayton Lye, MD  [facility] 08/08/2024, 9:52 AM Pager   If 7PM-7AM, please contact night-coverage

## 2024-08-10 ENCOUNTER — Telehealth: Payer: Self-pay

## 2024-08-10 ENCOUNTER — Telehealth: Payer: Self-pay | Admitting: *Deleted

## 2024-08-10 NOTE — Telephone Encounter (Signed)
 Noted- ok to close.

## 2024-08-10 NOTE — Telephone Encounter (Signed)
 Copied from CRM [id number]. Topic: General - Call Back - No Documentation >> Aug 10, 2024  1:23 PM Leah C wrote: Reason for CRM: Jon from [facility] wanted to call and inform that start of care is tomorrow.   [facility]  [phone]

## 2024-08-10 NOTE — Transitions of Care (Post Inpatient/ED Visit) (Signed)
   08/10/2024  Name: ADRIENE KNIPFER MRN: [id number] DOB: [birth year]  Today's TOC FU Call Status: Today's TOC FU Call Status:: Unsuccessful Call (1st Attempt) Unsuccessful Call (1st Attempt) Date: 08/10/24  Attempted to reach the patient regarding the most recent Inpatient/ED visit.  Follow Up Plan: Additional outreach attempts will be made to reach the patient to complete the Transitions of Care (Post Inpatient/ED visit) call.   Alan Ee, RN, BSN, CEN [employer]- Transition of Care Team.  [facility] [phone]

## 2024-08-11 ENCOUNTER — Other Ambulatory Visit (HOSPITAL_COMMUNITY): Payer: Self-pay

## 2024-08-11 ENCOUNTER — Ambulatory Visit
Admission: RE | Admit: 2024-08-11 | Discharge: 2024-08-11 | Disposition: A | Discharge status: Home / Self Care | Source: Ambulatory Visit | Attending: Family Medicine

## 2024-08-11 ENCOUNTER — Ambulatory Visit: Admitting: Family Medicine

## 2024-08-11 ENCOUNTER — Encounter: Payer: Self-pay | Admitting: Family Medicine

## 2024-08-11 ENCOUNTER — Telehealth: Payer: Self-pay

## 2024-08-11 VITALS — BP 100/60 | HR 111 | Temp 98.4°F | Ht <= 58 in | Wt 201.0 lb

## 2024-08-11 DIAGNOSIS — R112 Nausea with vomiting, unspecified: Secondary | ICD-10-CM | POA: Diagnosis not present

## 2024-08-11 DIAGNOSIS — R413 Other amnesia: Secondary | ICD-10-CM

## 2024-08-11 DIAGNOSIS — R202 Paresthesia of skin: Secondary | ICD-10-CM

## 2024-08-11 DIAGNOSIS — F331 Major depressive disorder, recurrent, moderate: Secondary | ICD-10-CM | POA: Diagnosis not present

## 2024-08-11 DIAGNOSIS — G8929 Other chronic pain: Secondary | ICD-10-CM

## 2024-08-11 DIAGNOSIS — M25511 Pain in right shoulder: Secondary | ICD-10-CM

## 2024-08-11 MED ORDER — ONDANSETRON 4 MG PO TBDP
4.0000 mg | ORAL_TABLET | Freq: Three times a day (TID) | ORAL | 2 refills | Status: DC | PRN
  Filled 2024-08-11: qty 20, 7d supply, fill #0

## 2024-08-11 MED ORDER — DULOXETINE HCL 60 MG PO CPEP
60.0000 mg | ORAL_CAPSULE | Freq: Every day | ORAL | 1 refills | Status: DC
  Filled 2024-08-11 (×2): qty 90, 90d supply, fill #0
  Filled 2024-09-03 – 2024-09-16 (×4): qty 30, 30d supply, fill #0
  Filled 2024-10-12: qty 30, 30d supply, fill #1
  Filled 2024-11-05 – 2024-11-09 (×3): qty 30, 30d supply, fill #2
  Filled 2024-12-09: qty 30, 30d supply, fill #3

## 2024-08-11 NOTE — Transitions of Care (Post Inpatient/ED Visit) (Signed)
   08/11/2024  Name: Shelby Anderson MRN: [id number] DOB: [birth year]  Today's TOC FU Call Status: Today's TOC FU Call Status:: Unsuccessful Call (2nd Attempt) Unsuccessful Call (2nd Attempt) Date: 08/11/24  Attempted to reach the patient regarding the most recent Inpatient/ED visit.  Follow Up Plan: Additional outreach attempts will be made to reach the patient to complete the Transitions of Care (Post Inpatient/ED visit) call.   Alan Ee, RN, BSN, CEN [employer]- Transition of Care Team.  [facility] [phone]

## 2024-08-11 NOTE — Progress Notes (Signed)
 Established Patient Office Visit  Subjective   Patient ID: Shelby Anderson, female    DOB: [birth year]  Age: 68 y.o. MRN: [id number]  Chief Complaint  Patient presents with   Hospitalization Follow-up   Emesis    Recurrent x2 weeks, unable to tolerate ginger ale    Emesis    Discussed the use of AI scribe software for clinical note transcription with the patient, who gave verbal consent to proceed.  History of Present Illness   Shelby Anderson is a 68 year old female who presents with persistent vomiting and confusion.  She was admitted to the hospital on August 31st due to persistent vomiting for two weeks and confusion, during which she was unable to recognize her husband and grandchildren. A comprehensive workup including labs, EKG, and CT scans was performed, revealing no significant abnormalities except for a suspected urinary tract infection, which was later ruled out by a negative urine test. She received two to three doses of antibiotics during her stay.  Prior to hospitalization, her medications were adjusted, with several cardiac medications stopped and spironolactone  started due to low potassium levels and leg swelling. She was also experiencing dehydration likely due to vomiting. Her Cymbalta  dosage was increased in March, but she does not recall the exact timing of the change. Post-hospitalization, Cymbalta  was decreased, and doxepin  and Lyrica  were discontinued. She has not restarted these medications since discharge.  She experiences ongoing vomiting, unable to keep down food or water , and significant weight loss from 214 to 201 pounds. She has been unable to take her chronic oxycodone  due to vomiting and has not taken Nexium  for some time, though she managed two doses recently. She has been able to consume only small amounts of Jell-O and crackers. She experiences severe abdominal pain and has not been able to take her pain medications.  She has a history of  similar episodes, with a previous occurrence in December presenting with vomiting but not confusion. She has a GI doctor but has not been able to secure a follow-up appointment. She has been off her pain medications, which has worsened her pain levels. She has been using Zofran  and promethazine  for nausea and GI reflux, respectively, but has limited supply.  Her blood sugars have been running over 120 every morning, and she is hesitant to take Farxiga  and glipizide  due to vomiting. Her A1c was 6.5 in September. She has a family history of dementia and is experiencing forgetfulness, with CT scans showing chronic microvascular ischemic changes. She has a history of shoulder issues, with worsening pain and numbness in her right shoulder and arm, previously advised for shoulder replacement.       Current Outpatient Medications  Medication Instructions   albuterol  (PROVENTIL ) (2.5 MG/3ML) 0.083% nebulizer solution Inhale 3 mLs (2.5 mg total) by nebulization every 6 (six) hours as needed for wheezing or shortness of breath (J45.40).   albuterol  (VENTOLIN  HFA) 108 (90 Base) MCG/ACT inhaler 1-2 puffs, Inhalation, Every 4 hours PRN   atorvastatin  (LIPITOR ) 80 mg, Oral, Daily at bedtime   budesonide -formoterol  (SYMBICORT ) 80-4.5 MCG/ACT inhaler Inhale 2 puffs into the lungs every morning and another 2 puffs 12 hours later.   buPROPion  (WELLBUTRIN  XL) 150 mg, Oral, Daily   Calcium  Carbonate-Vitamin D 600-200 MG-UNIT TABS 1 tablet, Daily   CINNAMON PO 1,000 mg, 2 times daily   dapagliflozin  propanediol (FARXIGA ) 10 mg, Oral, Daily before breakfast   DULoxetine  (CYMBALTA ) 60 mg, Oral, Daily   famotidine  (PEPCID ) 20 mg,  Oral, 2 times daily   fluticasone  (FLONASE ) 50 MCG/ACT nasal spray 1 spray, Daily PRN   furosemide  (LASIX ) 20 MG tablet Take 1 tablet (20 mg total) by mouth daily as needed for ankle swelling.   glipiZIDE  (GLUCOTROL  XL) 5 mg, Oral, BH-each morning   isosorbide  mononitrate (IMDUR ) 60 mg, Oral,  Every morning   levothyroxine  (SYNTHROID ) 75 mcg, Oral, Daily before breakfast   metFORMIN  (GLUCOPHAGE ) 500 mg, Oral, 2 times daily with meals   Misc Natural Products (GLUCOS-CHONDROIT-MSM COMPLEX PO) 2 tablets, Daily   mometasone  (ELOCON ) 0.1 % cream 1 Application, Daily PRN   montelukast  (SINGULAIR ) 10 mg, Oral, Nightly   Multiple Vitamin (MULITIVITAMIN WITH MINERALS) TABS 1 tablet, Daily   NexIUM  40 mg, Oral, 2 times daily   nitroGLYCERIN  (NITROSTAT ) 0.4 MG SL tablet Place 1 tablet under the tongue every 5 minutes as needed for chest pain up to 3 times. If no relief, call 911.   NURTEC 75 MG TBDP 1 each, Oral, Daily PRN   ondansetron  (ZOFRAN -ODT) 4 mg, Oral, Every 8 hours PRN   oxyCODONE  (ROXICODONE ) 7.5 mg, Oral, Every 12 hours PRN   potassium chloride  (KLOR-CON ) 10 MEQ tablet 20 mEq, Oral, Every morning   senna-docusate (SENOKOT-S) 8.6-50 MG tablet 1 tablet, Oral, 2 times daily   vitamin B-12 (CYANOCOBALAMIN ) 500 mcg, Daily   Vitamin D3 2,000 Units, Daily   vitamin E 200 Units, Daily    Patient Active Problem List   Diagnosis Date Noted   Acute encephalopathy 08/03/2024   Moderate episode of recurrent major depressive disorder (HCC) 02/10/2024   UTI (urinary tract infection) 08/01/2023   Nausea and vomiting 07/31/2023   Acute cystitis 07/31/2023   Hypothyroidism 09/11/2022   Dental caries 06/12/2022   Spondylosis without myelopathy or radiculopathy, cervical region 04/21/2021   Neutropenia with fever (HCC) 10/01/2019   Genetic testing 09/17/2019   Family history of melanoma    Family history of pancreatic cancer    Malignant neoplasm of upper-outer quadrant of left breast in female, estrogen receptor negative (HCC) 09/07/2019   Encounter for loop recorder at end of battery life 04/24/2018   Esophageal stricture 07/01/2017   Hyperlipidemia associated with type 2 diabetes mellitus (HCC) 05/20/2017   Allergic rhinitis 03/11/2017   Dilated cardiomyopathy (HCC) 02/07/2017    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 11/13/2016   Morbid obesity due to excess calories (HCC) 09/26/2016   Increased endometrial stripe thickness 06/27/2016   Intramural leiomyoma of uterus 06/27/2016   Type 2 diabetes mellitus without complication, without long-term current use of insulin  (HCC) 12/06/2015   HTN (hypertension), benign 07/19/2015   Arrhythmia 07/19/2015   Orthostatic hypotension 07/19/2015   GERD (gastroesophageal reflux disease) 07/19/2015   Chronic back pain 07/19/2015   Neuropathy 07/19/2015   Symptomatic PVCs 11/02/2014   Syncope 11/02/2014   Status post placement of implantable loop recorder 11/02/2014   Stenosis of cervix 01/07/2014   Chest pain 12/23/2013   Disorder of cervix 03/10/2013   Vaginal atrophy 03/10/2013   OSA (obstructive sleep apnea) 07/31/2012   Asthma 04/29/2012   Coronary artery disease 11/20/2011     Review of Systems  Gastrointestinal:  Positive for vomiting.      Objective:     BP 100/60   Pulse (!) 111   Temp 98.4 F (36.9 C) (Oral)   Ht 3' 4 (1.016 m)   Wt 201 lb (91.2 kg)   SpO2 96%   BMI 88.32 kg/m    Physical Exam Vitals reviewed.  Constitutional:      Appearance: Normal appearance. She is obese.  Cardiovascular:     Rate and Rhythm: Normal rate and regular rhythm.     Heart sounds: Normal heart sounds. No murmur heard. Pulmonary:     Effort: Pulmonary effort is normal.     Breath sounds: Normal breath sounds. No wheezing.  Neurological:     Mental Status: She is alert and oriented to person, place, and time. Mental status is at baseline.  Psychiatric:        Mood and Affect: Mood normal.        Behavior: Behavior normal.      No results found for any visits on 08/11/24.    The ASCVD Risk score (Arnett DK, et al., 2019) failed to calculate for the following reasons:   Risk score cannot be calculated because patient has a medical history suggesting prior/existing ASCVD    Assessment & Plan:   Moderate episode of recurrent major depressive disorder (HCC) -     DULoxetine  HCl; Take 1 capsule (60 mg total) by mouth daily.  Dispense: 90 capsule; Refill: 1  Nausea and vomiting, unspecified vomiting type -     Ondansetron ; Take 1 tablet (4 mg total) by mouth every 8 (eight) hours as needed for nausea or vomiting.  Dispense: 20 tablet; Refill: 2  Chronic right shoulder pain -     DG Shoulder Right; Future  Memory loss -     Ambulatory referral to Neurology  Arm paresthesia, right -     Ambulatory referral to Neurology   Assessment and Plan    Persistent vomiting and nausea with opioid withdrawal syndrome Persistent vomiting and nausea likely due to opioid withdrawal. CT scans normal, ruling out major pathology. - Restart Zofran  with disintegrating tablets. - Restart Phenergan , consider suppositories if needed. - Gradually reintroduce oxycodone . - Advised she follow up with the GI specialist for further evaluation and possible gastric emptying study.  Chronic pain syndrome Chronic pain exacerbated by cessation of pain medications and complicated by withdrawal symptoms. - Gradually reintroduce oxycodone . - Monitor pain levels and adjust medications as needed.  Right shoulder pain with possible nerve impingement Right shoulder pain with possible nerve impingement, causing loss of feeling and severe pain. - Order x-ray of right shoulder. - Refer to neurologist for evaluation and EMG testing.  Type 2 diabetes mellitus Type 2 diabetes with recent blood sugars over 120 mg/dL. A1c at 6.5% indicates good control. - Continue metformin  and glipizide . - Hold Farxiga  until regular meals and hydration are resumed.  Major depressive disorder Major depressive disorder with previous increase in Cymbalta . Current stressors include family issues and health concerns. - Restart Cymbalta  at 60 mg once nausea is controlled.  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 - Consider referral to neurologist if cognitive symptoms worsen.  Follow-up Follow-up plans discussed for ongoing management and specialist referrals. - Schedule follow-up with GI specialist as soon as possible. - Follow-up with primary care in January or sooner if needed. - Attend scheduled Medicare visit in October.     >30 minutes spent with patient reviewing her discharge summary, cardiology notes, labs and imaging studies from the hospitalization. Did a full medication review and advised she gradually restart her medications as tolerated, starting with the opioid.  Return in about 4 months (around 12/11/2024) for follow up DM.    Heron CHRISTELLA Sharper, MD

## 2024-08-11 NOTE — Patient Instructions (Addendum)
 Go back on the zofran  and once the nausea is under control then add back the oxycodone  tablets. Wait a couple of days, then add back cymbalta  Wait a couple of days  then add back lyrica  Keep going until you are back on all of your medications.

## 2024-08-12 ENCOUNTER — Other Ambulatory Visit (HOSPITAL_COMMUNITY): Payer: Self-pay

## 2024-08-12 ENCOUNTER — Telehealth: Payer: Self-pay | Admitting: *Deleted

## 2024-08-12 ENCOUNTER — Ambulatory Visit: Payer: Self-pay | Admitting: Family Medicine

## 2024-08-12 LAB — BASIC METABOLIC PANEL WITH GFR
BUN: 19 mg/dL (ref 6–23)
CO2: 33 meq/L — ABNORMAL HIGH (ref 19–32)
Calcium: 10.2 mg/dL (ref 8.4–10.5)
Chloride: 98 meq/L (ref 96–112)
Creatinine, Ser: 1.06 mg/dL (ref 0.40–1.20)
GFR: 54.78 mL/min — ABNORMAL LOW (ref >60.00)
Glucose, Bld: 167 mg/dL — ABNORMAL HIGH (ref 70–99)
Potassium: 3 meq/L — ABNORMAL LOW (ref 3.5–5.1)
Sodium: 141 meq/L (ref 135–145)

## 2024-08-12 LAB — MAGNESIUM: Magnesium: 1.5 mg/dL (ref 1.5–2.5)

## 2024-08-12 NOTE — Transitions of Care (Post Inpatient/ED Visit) (Addendum)
 08/12/2024  Name: Shelby Anderson MRN: [id number] DOB: 07-21-1956  Today's TOC FU Call Status: Today's TOC FU Call Status:: Successful TOC FU Call Completed TOC FU Call Complete Date: 08/12/24 Patient's Name and Date of Birth confirmed.  Transition Care Management Follow-up Telephone Call Discharge Facility: [facility]) Type of Discharge: Inpatient Admission Primary Inpatient Discharge Diagnosis:: Acute encephalopathy How have you been since you were released from the hospital?: Same Any questions or concerns?: Yes Patient Questions/Concerns:: Patient was having problem finding where her medications was sent Patient Questions/Concerns Addressed: Other: (RN went over meds that had changed and pt hadn't recvd. The medications was changed but prescription was not sent in since pt was on those meds. RN explained to patient to call pain clinic and explain she missed her appt because she was inpt need refill)  Items Reviewed: Medications obtained,verified, and reconciled?: No Medications Not Reviewed Reasons:: Other: (Patient has not been taking her medications due to nausea and vomiting.She has not recd her pain medication due to need refill.) Any new allergies since your discharge?: No Dietary orders reviewed?: No Do you have support at home?: Yes People in Home [RPT]: spouse Name of Support/Comfort Primary Source: Ricky  Medications Reviewed Today: Medications Reviewed Today   Medications were not reviewed in this encounter     Home Care and Equipment/Supplies: Were Home Health Services Ordered?: Yes [facility] of [facility] Agency[facility] Has Agency set up a time to come to your home?: Yes First Home Health Visit Date: 08/11/24 (Patient had cancelled because she had not received her walker. RN told patient to call them and reschedule.The can bring a loaner to get her started.) Any new equipment or medical supplies ordered?: Yes Name of Medical supply agency?:  [employer] Were you able to get the equipment/medical supplies?: No (RN gave patient the number to [employer] and see when they are bringing her walker.)  Functional Questionnaire: Do you need assistance with bathing/showering or dressing?: No Do you need assistance with meal preparation?: Yes Do you need assistance with eating?: No Do you have difficulty maintaining continence: No Do you need assistance with getting out of bed/getting out of a chair/moving?: Yes Do you have difficulty managing or taking your medications?: No  Follow up appointments reviewed: PCP Follow-up appointment confirmed?: Yes Date of PCP follow-up appointment?: 08/11/24 Follow-up Provider: Dr Heron Sharper Specialist [facility] Follow-up appointment confirmed?: NA Do you need transportation to your follow-up appointment?: No Do you understand care options if your condition(s) worsen?: Yes-patient verbalized understanding  SDOH Interventions Today    Flowsheet Row Most Recent Value  SDOH Interventions   Food Insecurity Interventions Intervention Not Indicated  Housing Interventions Intervention Not Indicated  Transportation Interventions Intervention Not Indicated  Utilities Interventions Intervention Not Indicated   Per patient she is having continuous nausea and vomiting. Patient stated she is using the Zofran  SL tablets with not much relief. Per patient she will see how she does thru the night and decide if she needs to go back to the ER.   RN discussed other alternatives such as ginger candy and peppermint oil in the home.  Per patient she was having to use buckets in the car. RN explained that emesis bags are available and where to get them.  Patient will follow up with pain clinic for refill on pain medications Patient will call [employer] to see when equipment will be delivered Patient will contact [facility] to reschedule visit.  RN made Jackson Acron aware of patient discharge. She  will follow up with  patient.  Discussed and offered 30 day TOC program.  Patient  declined due to being followed by Jackson Acron complex case manager.  The patient has been provided with contact information for the care management team and has been advised to call with any health -related questions or concerns.  The patient verbalized understanding with current plan of care.  The patient is directed to their insurance card regarding availability of benefits coverage.  Cathlean Headland BSN RN [facility] Health Care Management Coordinator [email or web address] Direct Dial: [phone]  Fax: [phone] Website: [email or web address]

## 2024-08-14 DIAGNOSIS — R600 Localized edema: Secondary | ICD-10-CM | POA: Diagnosis not present

## 2024-08-15 ENCOUNTER — Ambulatory Visit: Payer: Self-pay | Admitting: Cardiology

## 2024-08-15 LAB — BASIC METABOLIC PANEL WITH GFR
BUN/Creatinine Ratio: 18 (ref 12–28)
BUN: 16 mg/dL (ref 8–27)
CO2: 26 mmol/L (ref 20–29)
Calcium: 9.6 mg/dL (ref 8.7–10.3)
Chloride: 98 mmol/L (ref 96–106)
Creatinine, Ser: 0.89 mg/dL (ref 0.57–1.00)
Glucose: 178 mg/dL — ABNORMAL HIGH (ref 70–99)
Potassium: 3.7 mmol/L (ref 3.5–5.2)
Sodium: 141 mmol/L (ref 134–144)
eGFR: 71 mL/min/1.73 (ref >59)

## 2024-08-15 NOTE — Progress Notes (Signed)
 Her K is now normal, suspect nausea and vomiting contributed to low K.

## 2024-08-18 ENCOUNTER — Telehealth: Payer: Self-pay

## 2024-08-21 ENCOUNTER — Telehealth: Payer: Self-pay

## 2024-08-23 NOTE — Progress Notes (Unsigned)
 Cardiology Office Note    Date:  08/25/2024  ID:  Tiffancy, Moger 1956-11-10, MRN [id number] PCP:  Ozell Heron HERO, MD  Cardiologist:  Gordy Bergamo, MD  Electrophysiologist:  None   Chief Complaint: Hospital follow up for diastolic dysfunction   History of Present Illness: .    Shelby Anderson is a 68 y.o. female with visit-pertinent history of NSTEMI with normal coronary arteries, diabetes mellitus, orthostatic hypotension, chronic chest pain syndrome, recurrent syncope s/p loop recorder implantation by [facility] cardiology but no obvious etiology found, severe back pain, bronchial asthma and severe restrictive lung disease, severe GERD, distal esophageal stricture s/p dilation, peptic ulcer disease, depression.  Patient was recently seen on 06/19/2024 by Dr. Bergamo with persistent leg swelling.  Patient reported leg swelling despite taking double doses of Lasix .  She had noted that swelling was particularly severe a few days prior described as 4+ edema.  It was noted that her potassium level had been consistently low requiring daily supplementation.  Patient reported occasional chest pain which she described as normal with no significant changes.  Patient was started on spironolactone  25 mg daily, recommended to follow-up as needed.  On chart review patient was admitted on 08/03/2024 with altered mental status, vomiting.  It was felt that patient had altered mental status related to encephalopathy given combination of pain medications and anxiety/depression medications.  With adjustments to medications patient returned to baseline.  Brain imaging without acute intracranial abnormality, noted to have some evidence of advanced chronic signal changes and cerebral white matter atrophy.  Following up with her PCP/neurology regarding this.  Today she presents regarding medication changes made during her hospitalization.  Patient reports that her blood pressure was low while hospitalized and  so all of her blood pressure medications were discontinued, she reports that her metoprolol  was discontinued as she had atrial fibrillation with slow ventricular response.  On chart review I see no indication that she was in atrial fibrillation during her hospitalization.  Patient reports significant frustration as she feels that she is just as sick as she was when she initially presented to the hospital.  She reports ongoing nausea and vomiting, reports she has been unable to keep food down since she left the hospital with multiple episodes of vomiting during the day as well as abdominal pain.  She reports that she has been unable to keep fluids down for the last 2 days and has only been able to take her Nexium  once as she was unable to keep any medications down.  She reports that her previously noted chronic chest pain is unchanged and her breathing is unchanged.  On further discussion patient reports that she has had at least 3 syncopal episodes since her hospital discharge resulting in falls at home, she is unsure of how long she was unconscious for and is unable to provide much detail surrounding episodes.  ROS: .   Today she denies lower extremity edema, fatigue, palpitations, melena, hematuria, hemoptysis, diaphoresis, weakness, orthopnea, and PND.  All other systems are reviewed and otherwise negative. Studies Reviewed: SABRA   EKG:  EKG is ordered today, personally reviewed, demonstrating  EKG Interpretation Date/Time:  Tuesday August 25 2024 09:17:47 EDT Ventricular Rate:  92 PR Interval:  164 QRS Duration:  94 QT Interval:  382 QTC Calculation: 472 R Axis:   262  Text Interpretation: Normal sinus rhythm T wave abnormality inferior leads Anterolateral infarct (cited on or before 02-Aug-2024) Poor R wave progression Confirmed by Chad,  Katerine Morua ([id number]) on 08/25/2024 5:30:00 PM   CV Studies: Cardiac studies reviewed are outlined and summarized above. Otherwise please see EMR for full  report. Cardiac Studies & Procedures   ______________________________________________________________________________________________   STRESS TESTS  PCV MYOCARDIAL PERFUSION WO LEXISCAN  09/27/2021  Interpretation Summary Exercise nuclear stress test 09/27/2021: Normal ECG stress. The patient exercised for 7 minutes and 13 seconds of a Bruce protocol, achieving approximately 5.91 METs. Normal BP response. Reduced exercise tolerance. Breast tissue attenuation noted in the inferior wall.  Normal myocardial perfusion without ischemi aor scar. Gated SPECT imaging of the left ventricle was normal. All segments of left ventricle demonstrated normal wall motion and thickening. Normal left ventricle.  . No stress lung uptake. TID is normal. Stress LV EF is normal 58%. No previous exam available for comparison. Low risk study.   ECHOCARDIOGRAM  PCV ECHOCARDIOGRAM COMPLETE 09/27/2021  Narrative Echocardiogram 09/27/2021: Normal LV systolic function with visual EF 60-65%. Left ventricle cavity is normal in size. Mild left ventricular hypertrophy. Normal global wall motion. Normal diastolic filling pattern, normal LAP. No significant valvular heart disease. Compared to study 10/01/2019 no significant change.          ______________________________________________________________________________________________       Current Reported Medications:.    Current Meds  Medication Sig   metFORMIN  (GLUCOPHAGE ) 500 MG tablet Take 1 tablet (500 mg total) by mouth 2 (two) times daily with a meal.    Physical Exam:    VS:  BP 108/68   Pulse 93   Wt 199 lb 9.6 oz (90.5 kg)   SpO2 94%   BMI 87.71 kg/m    Wt Readings from Last 3 Encounters:  08/25/24 199 lb 9.6 oz (90.5 kg)  08/11/24 201 lb (91.2 kg)  08/04/24 207 lb 14.3 oz (94.3 kg)    GEN: Well nourished, well developed in no acute distress NECK: No JVD; No carotid bruits CARDIAC: RRR, no murmurs, rubs, gallops RESPIRATORY:  Clear to  auscultation without rales, wheezing or rhonchi  ABDOMEN: Soft, non-tender, non-distended EXTREMITIES:  No edema; No acute deformity     Asessement and Plan:.    Nausea/vomiting/syncope: Per patient she has been admitted multiple times in the last year regarding problems with increased nausea and vomiting resulting in dehydration.  Most recently was discharged on 08/08/2024 with adjustments to multiple medications.  Patient reports that she continues to have problems with ongoing nausea and vomiting, reports that she has been unable to keep any food down since she was discharged and has been unable to keep any fluids down for the last 2 days.  Reports significant fatigue dizziness and lightheadedness, she reports that she has had at least 3 syncopal episodes since discharge from the hospital, notes prior to syncope she becomes significantly lightheaded and dizzy.  Denies any significant palpitations.  Patient's blood pressure today 108/68, given patient's history of orthostatic hypotension and likely current dehydration, pt deferred orthostatic vitals given significant nausea.  Discussed ED evaluation with patient given frequent syncopal episodes and inability to keep any food down likely resulting in again recurrent dehydration, patient reports that she will go to the ED for further evaluation.  Will order a 30-day cardiac monitor and echocardiogram for further evaluation.  Hypertension: Patient's blood pressure medications were overall held at hospital discharge, patient reports that she has been unable to keep any medications down following her discharge from the hospital.  Blood pressure today initially 116/80, on recheck was 108/72.  Given concerns for dehydration recommended ED evaluation.  Chest pain: Noted to have normal coronary arteries, patient with intermittent chest pain with no significant episodes outside of normal occurrences.  Patient reports this is unchanged and has been stable.  Question  if her chest discomfort is related to significant GI history, patient notes that she has history of esophageal strictures.  Nausea/vomiting: Patient hospitalized in 08/2024 in setting of altered mental status and nausea/vomiting.  Patient notes that multiple medications were adjusted however she continues to note significant nausea and vomiting.  She reports that she has not been able to eat solid food in the last few days and has difficulty keeping down fluids.  She is try to get in with her gastroenterologist however has been unable to secure a follow-up appointment, reports that she feels just as bad as when she was in the hospital and has had no improvement.  As noted above given patient's reported recurrent syncopal episodes and likely significant dehydration recommend ED evaluation.   Disposition: F/u with Marsden Zaino, NP in 6 weeks.   Signed, Chilton Sallade D Lester Crickenberger, NP

## 2024-08-25 ENCOUNTER — Encounter: Payer: Self-pay | Admitting: Cardiology

## 2024-08-25 ENCOUNTER — Ambulatory Visit: Attending: Cardiology | Admitting: Cardiology

## 2024-08-25 ENCOUNTER — Other Ambulatory Visit: Payer: Self-pay

## 2024-08-25 ENCOUNTER — Emergency Department (HOSPITAL_BASED_OUTPATIENT_CLINIC_OR_DEPARTMENT_OTHER)
Admission: EM | Admit: 2024-08-25 | Discharge: 2024-08-25 | Discharge status: Against Medical Advice | Source: Ambulatory Visit | Attending: Emergency Medicine | Admitting: Emergency Medicine

## 2024-08-25 ENCOUNTER — Encounter (HOSPITAL_BASED_OUTPATIENT_CLINIC_OR_DEPARTMENT_OTHER): Payer: Self-pay | Admitting: Emergency Medicine

## 2024-08-25 VITALS — BP 108/68 | HR 93 | Wt 199.6 lb

## 2024-08-25 DIAGNOSIS — Z5321 Procedure and treatment not carried out due to patient leaving prior to being seen by health care provider: Secondary | ICD-10-CM | POA: Diagnosis not present

## 2024-08-25 DIAGNOSIS — R55 Syncope and collapse: Secondary | ICD-10-CM

## 2024-08-25 DIAGNOSIS — I10 Essential (primary) hypertension: Secondary | ICD-10-CM

## 2024-08-25 DIAGNOSIS — I951 Orthostatic hypotension: Secondary | ICD-10-CM

## 2024-08-25 DIAGNOSIS — R600 Localized edema: Secondary | ICD-10-CM

## 2024-08-25 DIAGNOSIS — R001 Bradycardia, unspecified: Secondary | ICD-10-CM

## 2024-08-25 NOTE — Patient Instructions (Signed)
 Thank you for choosing [facility]!     Medication Instructions:  No medication changes were made during today's visit.  *If you need a refill on your cardiac medications before your next appointment, please call your pharmacy*   Lab Work: No labs were ordered during today's visit.  If you have labs (blood work) drawn today and your tests are completely normal, you will receive your results only by: MyChart Message (if you have MyChart) OR A paper copy in the mail If you have any lab test that is abnormal or we need to change your treatment, we will call you to review the results.   Testing/Procedures: Your physician has requested that you have an echocardiogram. Echocardiography is a painless test that uses sound waves to create images of your heart. It provides your doctor with information about the size and shape of your heart and how well your heart's chambers and valves are working. This procedure takes approximately one hour. There are no restrictions for this procedure. Please do NOT wear cologne, perfume, aftershave, or lotions (deodorant is allowed). Please arrive 15 minutes prior to your appointment time.  Please note: We ask at that you not bring children with you during ultrasound (echo/ vascular) testing. Due to room size and safety concerns, children are not allowed in the ultrasound rooms during exams. Our front office staff cannot provide observation of children in our lobby area while testing is being conducted. An adult accompanying a patient to their appointment will only be allowed in the ultrasound room at the discretion of the ultrasound technician under special circumstances. We apologize for any inconvenience.   Your physician has recommended that you wear an event monitor. Event monitors are medical devices that record the heart's electrical activity. Doctors most often us  these monitors to diagnose arrhythmias. Arrhythmias are problems with the speed or  rhythm of the heartbeat. The monitor is a small, portable device. You can wear one while you do your normal daily activities. This is usually used to diagnose what is causing palpitations/syncope (passing out).   Your next appointment:   6 week(s)   Provider:   Katlyn West, NP           Follow-Up: At [facility], you and your health needs are our priority.  As part of our continuing mission to provide you with exceptional heart care, we have created designated Provider Care Teams.  These Care Teams include your primary Cardiologist (physician) and Advanced Practice Providers (APPs -  Physician Assistants and Nurse Practitioners) who all work together to provide you with the care you need, when you need it. We recommend signing up for the patient portal called MyChart.  Sign up information is provided on this After Visit Summary.  MyChart is used to connect with patients for Virtual Visits (Telemedicine).  Patients are able to view lab/test results, encounter notes, upcoming appointments, etc.  Non-urgent messages can be sent to your provider as well.   To learn more about what you can do with MyChart, go to [email or web address].

## 2024-08-25 NOTE — ED Triage Notes (Signed)
 Reports +n/v and near syncopal episodes x 2 months. Was orthostatic = at PCP pta. Denies CP or SHOB.

## 2024-08-26 ENCOUNTER — Other Ambulatory Visit: Payer: Self-pay | Admitting: Internal Medicine

## 2024-08-26 ENCOUNTER — Other Ambulatory Visit: Payer: Self-pay

## 2024-08-26 ENCOUNTER — Other Ambulatory Visit: Payer: Self-pay | Admitting: Cardiology

## 2024-08-26 ENCOUNTER — Other Ambulatory Visit: Payer: Self-pay | Admitting: Adult Health

## 2024-08-26 ENCOUNTER — Other Ambulatory Visit (HOSPITAL_COMMUNITY): Payer: Self-pay

## 2024-08-26 ENCOUNTER — Other Ambulatory Visit: Payer: Self-pay | Admitting: Family Medicine

## 2024-08-26 DIAGNOSIS — R600 Localized edema: Secondary | ICD-10-CM

## 2024-08-26 DIAGNOSIS — I10 Essential (primary) hypertension: Secondary | ICD-10-CM

## 2024-08-26 MED ORDER — PROMETHAZINE HCL 25 MG PO TABS
25.0000 mg | ORAL_TABLET | Freq: Every day | ORAL | 1 refills | Status: AC
  Filled 2024-09-03 – 2024-09-16 (×4): qty 30, 30d supply, fill #0
  Filled 2024-10-12: qty 30, 30d supply, fill #1
  Filled 2024-11-05 – 2024-11-09 (×3): qty 30, 30d supply, fill #2
  Filled 2024-12-09: qty 30, 30d supply, fill #3

## 2024-08-26 MED ORDER — METFORMIN HCL 500 MG PO TABS
500.0000 mg | ORAL_TABLET | Freq: Two times a day (BID) | ORAL | 5 refills | Status: AC
  Filled 2024-09-03 – 2024-09-16 (×4): qty 60, 30d supply, fill #0
  Filled 2024-10-12: qty 60, 30d supply, fill #1
  Filled 2024-11-05 – 2024-11-09 (×3): qty 60, 30d supply, fill #2
  Filled 2024-12-09: qty 60, 30d supply, fill #3
  Filled 2025-01-08 (×2): qty 60, 30d supply, fill #4

## 2024-08-26 MED ORDER — LISINOPRIL 5 MG PO TABS
5.0000 mg | ORAL_TABLET | Freq: Every morning | ORAL | 3 refills | Status: DC
  Filled 2024-09-03 – 2024-09-16 (×4): qty 30, 30d supply, fill #0

## 2024-08-26 MED ORDER — BUPROPION HCL ER (XL) 150 MG PO TB24
150.0000 mg | ORAL_TABLET | Freq: Every day | ORAL | 3 refills | Status: DC
  Filled 2024-09-03 – 2024-09-16 (×4): qty 30, 30d supply, fill #0
  Filled 2024-10-12: qty 30, 30d supply, fill #1
  Filled 2024-11-05 – 2024-11-09 (×3): qty 30, 30d supply, fill #2
  Filled 2024-12-09: qty 30, 30d supply, fill #3

## 2024-08-27 ENCOUNTER — Other Ambulatory Visit (HOSPITAL_COMMUNITY): Payer: Self-pay

## 2024-08-27 ENCOUNTER — Other Ambulatory Visit: Payer: Self-pay

## 2024-08-31 NOTE — Progress Notes (Signed)
 CM spoke with [facility] CMA and patient states that she did not receive her rolator from [facility] on 08/08/2024, when she was discharged from the hospital.  I called Jermaine, CM with [employer] and asked that he call the patient and arrange for delivery of the equipment to the home.

## 2024-09-01 ENCOUNTER — Ambulatory Visit (INDEPENDENT_AMBULATORY_CARE_PROVIDER_SITE_OTHER)
Admission: RE | Admit: 2024-09-01 | Discharge: 2024-09-01 | Disposition: A | Discharge status: Home / Self Care | Source: Ambulatory Visit | Attending: Family Medicine | Admitting: Family Medicine

## 2024-09-01 DIAGNOSIS — Z79891 Long term (current) use of opiate analgesic: Secondary | ICD-10-CM | POA: Diagnosis not present

## 2024-09-01 DIAGNOSIS — G4700 Insomnia, unspecified: Secondary | ICD-10-CM | POA: Diagnosis not present

## 2024-09-01 DIAGNOSIS — M25511 Pain in right shoulder: Secondary | ICD-10-CM

## 2024-09-01 DIAGNOSIS — G8929 Other chronic pain: Secondary | ICD-10-CM | POA: Diagnosis not present

## 2024-09-01 DIAGNOSIS — M19011 Primary osteoarthritis, right shoulder: Secondary | ICD-10-CM | POA: Diagnosis not present

## 2024-09-01 DIAGNOSIS — G894 Chronic pain syndrome: Secondary | ICD-10-CM | POA: Diagnosis not present

## 2024-09-01 DIAGNOSIS — M47816 Spondylosis without myelopathy or radiculopathy, lumbar region: Secondary | ICD-10-CM | POA: Diagnosis not present

## 2024-09-03 ENCOUNTER — Other Ambulatory Visit: Payer: Self-pay

## 2024-09-03 ENCOUNTER — Inpatient Hospital Stay

## 2024-09-03 ENCOUNTER — Inpatient Hospital Stay: Attending: Adult Health | Admitting: Adult Health

## 2024-09-03 ENCOUNTER — Encounter: Payer: Self-pay | Admitting: Adult Health

## 2024-09-03 ENCOUNTER — Other Ambulatory Visit (HOSPITAL_COMMUNITY): Payer: Self-pay

## 2024-09-03 ENCOUNTER — Other Ambulatory Visit: Payer: Self-pay | Admitting: *Deleted

## 2024-09-03 VITALS — BP 125/77 | HR 93 | Temp 98.0°F | Resp 18 | Ht 66.0 in | Wt 190.5 lb

## 2024-09-03 DIAGNOSIS — R234 Changes in skin texture: Secondary | ICD-10-CM | POA: Diagnosis not present

## 2024-09-03 DIAGNOSIS — C50412 Malignant neoplasm of upper-outer quadrant of left female breast: Secondary | ICD-10-CM

## 2024-09-03 DIAGNOSIS — Z853 Personal history of malignant neoplasm of breast: Secondary | ICD-10-CM | POA: Insufficient documentation

## 2024-09-03 DIAGNOSIS — Z171 Estrogen receptor negative status [ER-]: Secondary | ICD-10-CM | POA: Diagnosis not present

## 2024-09-03 LAB — CBC WITH DIFFERENTIAL (CANCER CENTER ONLY)
Abs Immature Granulocytes: 0.02 K/uL (ref 0.00–0.07)
Basophils Absolute: 0 K/uL (ref 0.0–0.1)
Basophils Relative: 1 %
Eosinophils Absolute: 0.2 K/uL (ref 0.0–0.5)
Eosinophils Relative: 3 %
HCT: 45.5 % (ref 36.0–46.0)
Hemoglobin: 14.8 g/dL (ref 12.0–15.0)
Immature Granulocytes: 0 %
Lymphocytes Relative: 19 %
Lymphs Abs: 1.2 K/uL (ref 0.7–4.0)
MCH: 28.6 pg (ref 26.0–34.0)
MCHC: 32.5 g/dL (ref 30.0–36.0)
MCV: 87.8 fL (ref 80.0–100.0)
Monocytes Absolute: 0.5 K/uL (ref 0.1–1.0)
Monocytes Relative: 8 %
Neutro Abs: 4.5 K/uL (ref 1.7–7.7)
Neutrophils Relative %: 69 %
Platelet Count: 291 K/uL (ref 150–400)
RBC: 5.18 MIL/uL — ABNORMAL HIGH (ref 3.87–5.11)
RDW: 15.4 % (ref 11.5–15.5)
WBC Count: 6.5 K/uL (ref 4.0–10.5)
nRBC: 0 % (ref 0.0–0.2)

## 2024-09-03 LAB — CMP (CANCER CENTER ONLY)
ALT: 27 U/L (ref 0–44)
AST: 32 U/L (ref 15–41)
Albumin: 4.3 g/dL (ref 3.5–5.0)
Alkaline Phosphatase: 63 U/L (ref 38–126)
Anion gap: 6 (ref 5–15)
BUN: 17 mg/dL (ref 8–23)
CO2: 34 mmol/L — ABNORMAL HIGH (ref 22–32)
Calcium: 10.7 mg/dL — ABNORMAL HIGH (ref 8.9–10.3)
Chloride: 102 mmol/L (ref 98–111)
Creatinine: 0.87 mg/dL (ref 0.44–1.00)
GFR, Estimated: >60 mL/min (ref >60)
Glucose, Bld: 176 mg/dL — ABNORMAL HIGH (ref 70–99)
Potassium: 3.7 mmol/L (ref 3.5–5.1)
Sodium: 142 mmol/L (ref 135–145)
Total Bilirubin: 0.5 mg/dL (ref 0.0–1.2)
Total Protein: 8.2 g/dL — ABNORMAL HIGH (ref 6.5–8.1)

## 2024-09-03 LAB — MAGNESIUM: Magnesium: 1.3 mg/dL — ABNORMAL LOW (ref 1.7–2.4)

## 2024-09-03 MED FILL — Levothyroxine Sodium Tab 75 MCG: ORAL | 30 days supply | Qty: 30 | Fill #4 | Status: CN

## 2024-09-03 NOTE — Progress Notes (Signed)
 [facility] Cancer Follow up:    Shelby Anderson HERO, MD [address] [place] [address]   DIAGNOSIS: Cancer Staging  Malignant neoplasm of upper-outer quadrant of left breast in female, estrogen receptor negative (HCC) Staging form: Breast, AJCC 8th Edition - Clinical stage from 09/09/2019: Stage IIB (cT2, cN0, cM0, G3, ER-, PR-, HER2-) - Unsigned Stage prefix: Initial diagnosis Histologic grading system: 3 grade system - Pathologic stage from 12/10/2019: ypT1a, ypN0, cM0 - Signed by Crawford Morna Pickle, NP on 12/23/2019 Stage prefix: Post-therapy    SUMMARY OF ONCOLOGIC HISTORY: Oncology History  Malignant neoplasm of upper-outer quadrant of left breast in female, estrogen receptor negative (HCC)  09/07/2019 Initial Diagnosis   Routine screening mammogram detected a 2.1cm mass in the left breast and no left axillary adenopathy. 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   09/15/2019 Genetic Testing   Genetic testing reported out on September 15, 2019 through the Multi-cancer panel found no pathogenic mutations. The Multi-Gene Panel offered by [facility] includes sequencing and/or deletion duplication testing of the following 85 genes: AIP, ALK, APC, ATM, AXIN2,BAP1,  BARD1, BLM, BMPR1A, BRCA1, BRCA2, BRIP1, CASR, CDC73, CDH1, CDK4, CDKN1B, CDKN1C, CDKN2A (p14ARF), CDKN2A (p16INK4a), CEBPA, CHEK2, CTNNA1, DICER1, DIS3L2, EGFR (c.2369C>T, p.Thr790Met variant only), EPCAM (Deletion/duplication testing only), FH, FLCN, GATA2, GPC3, GREM1 (Promoter region deletion/duplication testing only), HOXB13 (c.251G>A, p.Gly84Glu), HRAS, KIT, MAX, MEN1, MET, MITF (c.952G>A, p.Glu318Lys variant only), MLH1, MSH2, MSH3, MSH6, MUTYH, NBN, NF1, NF2, NTHL1, PALB2, PDGFRA, PHOX2B, PMS2, POLD1, POLE, POT1, PRKAR1A, PTCH1, PTEN, RAD50, RAD51C, RAD51D, RB1, RECQL4, RET, RNF43, RUNX1, SDHAF2, SDHA (sequence changes only), SDHB, SDHC, SDHD, SMAD4, SMARCA4, SMARCB1,  SMARCE1, STK11, SUFU, TERC, TERT, TMEM127, TP53, TSC1, TSC2, VHL, WRN and WT1.  The test report has been scanned into EPIC and is located under the Molecular Pathology section of the Results Review tab.  A portion of the result report is included below for reference.    09/24/2019 - 10/15/2019 Chemotherapy   palonosetron  (ALOXI ) injection 0.25 mg, 0.25 mg, Intravenous,  Once, 2 of 6 cycles. Administration: 0.25 mg (09/24/2019), 0.25 mg (10/15/2019)  pegfilgrastim  (NEULASTA  ONPRO KIT) injection 6 mg, 6 mg, Subcutaneous, Once, 2 of 6 cycles. Administration: 6 mg (09/24/2019), 6 mg (10/15/2019)  cyclophosphamide  (CYTOXAN ) 1,100 mg in sodium chloride  0.9 % 250 mL chemo infusion, 500 mg/m2 = 1,100 mg (83.3 % of original dose 600 mg/m2), Intravenous,  Once, 2 of 6 cycles. Dose modification: 500 mg/m2 (original dose 600 mg/m2, Cycle 1, Reason: Provider Judgment). Administration: 1,100 mg (09/24/2019), 1,100 mg (10/15/2019)  DOCEtaxel  (TAXOTERE ) 140 mg in sodium chloride  0.9 % 250 mL chemo infusion, 65 mg/m2 = 140 mg (86.7 % of original dose 75 mg/m2), Intravenous,  Once, 2 of 6 cycles. Dose modification: 65 mg/m2 (original dose 75 mg/m2, Cycle 1, Reason: Provider Judgment). Administration: 140 mg (09/24/2019), 140 mg (10/15/2019)  fosaprepitant  (EMEND) 150 mg in sodium chloride  0.9 % 145 mL IVPB, Intravenous,  Once, 2 of 6 cycles. Administration:  (09/24/2019),  (10/15/2019)   12/09/2019 Surgery   Left lumpectomy Viktoria) [id number]): microscopic focus of residual IDC, grade 3, with high grade DCIS, clear margins. No regional lymph nodes were examined.   12/10/2019 Cancer Staging   Staging form: Breast, AJCC 8th Edition - Pathologic stage from 12/10/2019: No Stage Recommended (ypT1a, pN0, cM0) - Signed by Crawford Morna Pickle, NP on 12/23/2019   01/13/2020 - 02/08/2020 Radiation Therapy   The patient initially received a dose of  42.56 Gy in 16 fractions to the breast using whole-breast tangent fields.  This was delivered using a 3-D conformal technique. The pt received a boost delivering an additional 8 Gy in 4 fractions using a electron boost with electrons. The total dose was 50.56 Gy.     CURRENT THERAPY: observation  INTERVAL HISTORY:  Discussed the use of AI scribe software for clinical note transcription with the patient, who gave verbal consent to proceed.  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 She experiences significant breast pain and changes, with her chest sinking in and extreme pain. There are knots in her breast, one present since surgery and another more prominent over the past month, especially noticeable when raising her arm.  She has ongoing difficulty keeping food and liquids down, including ginger ale, Sprite, club crackers, and applesauce. She initially tolerated ginger ale but switched to Sprite, which she could only tolerate for two days before vomiting. She has lost 19 pounds since her most recent ER visit.  Her most recent mammogram on August 21, 2023, showed no mammographic evidence of malignancy, with breast density category C. A CT of the abdomen and pelvis on August 02, 2024, showed no acute localizing process to explain her nausea, but revealed a small hiatal hernia and a small uterine fibroid. A CT brain scan was negative except for microvascular ischemic changes, and an MRI showed chronic signal changes in the cerebral white matter and basal ganglia.     Patient Active Problem List   Diagnosis Date Noted   Acute encephalopathy 08/03/2024   Moderate episode of recurrent major depressive disorder (HCC) 02/10/2024   UTI (urinary tract infection)  08/01/2023   Nausea and vomiting 07/31/2023   Acute cystitis 07/31/2023   Hypothyroidism 09/11/2022   Dental caries 06/12/2022   Spondylosis without myelopathy or radiculopathy, cervical region 04/21/2021   Neutropenia with fever 10/01/2019   Genetic testing 09/17/2019   Family history of melanoma    Family history of pancreatic cancer    Malignant neoplasm of upper-outer quadrant of left breast in female, estrogen receptor negative (HCC) 09/07/2019   Encounter for loop recorder at end of battery life 04/24/2018   Esophageal stricture 07/01/2017   Hyperlipidemia associated with type 2 diabetes mellitus (HCC) 05/20/2017   Allergic rhinitis 03/11/2017   Dilated cardiomyopathy (HCC) 02/07/2017   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 11/13/2016   Morbid obesity due to excess calories (HCC) 09/26/2016   Increased endometrial stripe thickness 06/27/2016   Intramural leiomyoma of uterus 06/27/2016   Type 2 diabetes mellitus without complication, without long-term current use of insulin  (HCC) 12/06/2015   HTN (hypertension), benign 07/19/2015   Arrhythmia 07/19/2015   Orthostatic hypotension 07/19/2015   GERD (gastroesophageal reflux disease) 07/19/2015   Chronic back pain 07/19/2015   Neuropathy 07/19/2015   Symptomatic PVCs 11/02/2014   Syncope 11/02/2014   Status post placement of implantable loop recorder 11/02/2014   Stenosis of cervix 01/07/2014   Chest pain 12/23/2013   Disorder of cervix 03/10/2013   Vaginal atrophy 03/10/2013   OSA (obstructive sleep apnea) 07/31/2012   Asthma 04/29/2012   Coronary artery disease 11/20/2011    is allergic to lodine [etodolac], oxycontin  [oxycodone  hcl], penicillins, aspirin ,  darvocet [propoxyphene n-acetaminophen ], nitroglycerin , propoxyphene, tramadol, and valium.  MEDICAL HISTORY: Past Medical History:  Diagnosis Date   Allergy     Anemia    chronic   Angina    Prinzmetal angina   Anxiety and depression    Arthritis     Asthma    Atrial fibrillation (HCC)    h/o AF w/frequent PVCs   Breast cancer (HCC)    Left   Cancer (HCC)    hx of skin cancer    CHF (congestive heart failure) (HCC)    Chronic back pain greater than 3 months duration    on chronic narcotics, treated at pain clinic   Chronic respiratory failure (HCC) 09/15/2015   ONO 09/04/15 + desats >begin O2 at 2l/ m    Colon polyps    hyperplastic   Coronary artery disease    Arrythmia, orthostatic hypotension, HLD, HTN; sees Dr. Ladona   Difficult intubation    TMJ & woke up when they were still cutting on me   Dysrhythmia    sees Dr. Ladona and a cardiologist at [facility]   Esophageal stricture    Family history of melanoma ]   Family history of pancreatic cancer    Fatty liver    Fatty liver    Fibroids    Fibromyalgia    in my legs   GERD (gastroesophageal reflux disease)    hx hiatal hernia, stricture and gastric ulcer   Headache(784.0)    migraines   Heart murmur    Hiatal hernia    History of loop recorder    History of migraines    dx'd when I was in my teens   Hyperlipemia    Hypertension    Mental disorder    Mild episode of recurrent major depressive disorder 12/06/2015   Myocardial infarction [facility]) 1980's & 1990;   sees Dr. Ladona   OSA (obstructive sleep apnea)    Personal history of chemotherapy    Personal history of radiation therapy    Pneumonia    multiple times   PONV (postoperative nausea and vomiting)    Recurrent upper respiratory infection (URI)    S/P left TKA 09/25/2016   Shortness of breath 11/20/11   all the time, sees pulmonlogy, ? asthma   Stenotic cervical os    Stomach ulcer    3 small; found in 05/2011   TMJ (dislocation of temporomandibular joint)    Tuberculosis    + TB SKIN TEST   Type 2 diabetes mellitus without complication, without long-term current use of insulin  (HCC) 12/06/2015   not on meds     SURGICAL HISTORY: Past Surgical History:  Procedure Laterality Date    ACHILLES TENDON REPAIR  1970's   left ankle   ARTHROSCOPIC REPAIR ACL     left knee cap   BREAST BIOPSY Right 04/08/2013   again in October or November 2020   BREAST LUMPECTOMY Left 12/09/2019   BREAST LUMPECTOMY WITH RADIOACTIVE SEED AND SENTINEL LYMPH NODE BIOPSY Left 12/09/2019   Procedure: LEFT BREAST LUMPECTOMY WITH RADIOACTIVE SEED AND LEFT AXILLARY SENTINEL LYMPH NODE BIOPSY;  Surgeon: Ebbie Cough, MD;  Location: [facility] OR;  Service: General;  Laterality: Left;  PEC BLOCK   CARDIAC CATHETERIZATION     loop recorder   CARPAL TUNNEL RELEASE  unknown   left hand   COLONOSCOPY     ESOPHAGOGASTRODUODENOSCOPY (EGD) WITH PROPOFOL  N/A 10/29/2017   Procedure: ESOPHAGOGASTRODUODENOSCOPY (EGD) WITH PROPOFOL ;  Surgeon: Abran Norleen SAILOR, MD;  Location:  [facility] ENDOSCOPY;  Service: Endoscopy;  Laterality: N/A;   LOOP RECORDER IMPLANT     PORT-A-CATH REMOVAL N/A 12/09/2019   Procedure: REMOVAL PORT-A-CATH;  Surgeon: Ebbie Cough, MD;  Location: [facility] OR;  Service: General;  Laterality: N/A;   PORTACATH PLACEMENT N/A 09/23/2019   Procedure: INSERTION PORT-A-CATH Right Internal Brooke RATER;  Surgeon: Ebbie Cough, MD;  Location: [facility] OR;  Service: General;  Laterality: N/A;   post ganglionectomy  1970's   for migraine headaches   pouch string  [phone]   did this 3 times (once w/each pregnancy)   SAVORY DILATION N/A 10/29/2017   Procedure: SAVORY DILATION;  Surgeon: Abran Norleen SAILOR, MD;  Location: [facility] ENDOSCOPY;  Service: Endoscopy;  Laterality: N/A;   TOTAL KNEE ARTHROPLASTY Left 09/25/2016   Procedure: LEFT TOTAL KNEE ARTHROPLASTY;  Surgeon: Cough Car, MD;  Location: [facility] ORS;  Service: Orthopedics;  Laterality: Left;   TUBAL LIGATION  1980's    SOCIAL HISTORY: Social History   Socioeconomic History   Marital status: Married    Spouse name: Not on file   Number of children: 3   Years of education: 15   Highest education level: Some college, no degree  Occupational  History   Occupation: Retired Charity fundraiser  Tobacco Use   Smoking status: Never   Smokeless tobacco: Never  Vaping Use   Vaping status: Never Used  Substance and Sexual Activity   Alcohol use: No    Alcohol/week: 0.0 standard drinks of alcohol   Drug use: No   Sexual activity: Not Currently    Birth control/protection: Surgical    Comment: 1st intercourse 68 yo-Fewer than 5 partners  Other Topics Concern   Not on file  Social History Narrative   Right handed   One story home   3 children   Has custody of 2 grandchildren ages 65 & 64    Social Drivers of Corporate investment banker Strain: Low Risk  (07/09/2024)   Overall Financial Resource Strain (CARDIA)    Difficulty of Paying Living Expenses: Not very hard  Food Insecurity: No Food Insecurity (08/12/2024)   Hunger Vital Sign    Worried About Running Out of Food in the Last Year: Never true    Ran Out of Food in the Last Year: Never true  Transportation Needs: No Transportation Needs (08/12/2024)   PRAPARE - Administrator, Civil Service (Medical): No    Lack of Transportation (Non-Medical): No  Physical Activity: Sufficiently Active (09/27/2023)   Exercise Vital Sign    Days of Exercise per Week: 3 days    Minutes of Exercise per Session: 60 min  Stress: No Stress Concern Present (07/09/2024)   [employer] of Occupational Health - Occupational Stress Questionnaire    Feeling of Stress: Not at all  Social Connections: Unknown (08/03/2024)   Social Connection and Isolation Panel    Frequency of Communication with Friends and Family: Three times a week    Frequency of Social Gatherings with Friends and Family: More than three times a week    Attends Religious Services: 1 to 4 times per year    Active Member of [employer] or Organizations: Not on file    Attends Banker Meetings: Not on file    Marital Status: Not on file  Intimate Partner Violence: Not At Risk (08/12/2024)   Humiliation, Afraid, Rape, and Kick  questionnaire    Fear of Current or Ex-Partner: No    Emotionally Abused: No  Physically Abused: No    Sexually Abused: No    FAMILY HISTORY: Family History  Problem Relation Age of Onset   Malignant hyperthermia Father    Hypertension Father    Heart disease Father    Diabetes Father    Cancer Father        skin   Hypertension Mother    Heart disease Mother    Multiple myeloma Mother    Cancer Sister        CERVICAL   Hypertension Sister    Cancer Brother 60       MELANOMA   Heart disease Maternal Grandmother    Other Maternal Grandmother 32       complications of childbirth   Heart disease Maternal Grandfather    Cancer Paternal Grandmother        ?    Heart disease Paternal Grandmother    Heart disease Paternal Grandfather    Cancer Brother        LUNG   Diabetes Sister    Hypertension Sister    Heart disease Sister    Cancer Sister    Cancer Brother    Pancreatic cancer Niece 86   Cancer Nephew 40       unknown- currently in the [employer]   Anesthesia problems Neg Hx    Hypotension Neg Hx    Pseudochol deficiency Neg Hx    Colon cancer Neg Hx    Esophageal cancer Neg Hx    Rectal cancer Neg Hx    Stomach cancer Neg Hx    Breast cancer Neg Hx     Review of Systems  Constitutional:  Negative for appetite change, chills, fatigue, fever and unexpected weight change.  HENT:   Negative for hearing loss, lump/mass and trouble swallowing.   Eyes:  Negative for eye problems and icterus.  Respiratory:  Negative for chest tightness, cough and shortness of breath.   Cardiovascular:  Negative for chest pain, leg swelling and palpitations.  Gastrointestinal:  Positive for nausea and vomiting. Negative for abdominal distention, abdominal pain, constipation and diarrhea.  Endocrine: Negative for hot flashes.  Genitourinary:  Negative for difficulty urinating.   Musculoskeletal:  Negative for arthralgias.  Skin:  Negative for itching and rash.  Neurological:   Negative for dizziness, extremity weakness, headaches and numbness.  Hematological:  Negative for adenopathy. Does not bruise/bleed easily.  Psychiatric/Behavioral:  Negative for depression. The patient is not nervous/anxious.       PHYSICAL EXAMINATION   Onc Performance Status - 09/03/24 1040       ECOG Perf Status   ECOG Perf Status Fully active, able to carry on all pre-disease performance without restriction      KPS SCALE   KPS % SCORE Able to carry on normal activity, minor s/s of disease          Vitals:   09/03/24 1022  BP: 125/77  Pulse: 93  Resp: 18  Temp: 98 F (36.7 C)  SpO2: 95%    Physical Exam Constitutional:      General: She is not in acute distress.    Appearance: Normal appearance. She is not toxic-appearing.  HENT:     Head: Normocephalic and atraumatic.     Mouth/Throat:     Mouth: Mucous membranes are moist.     Pharynx: Oropharynx is clear. No oropharyngeal exudate or posterior oropharyngeal erythema.  Eyes:     General: No scleral icterus. Cardiovascular:     Rate and Rhythm: Normal rate  and regular rhythm.     Pulses: Normal pulses.     Heart sounds: Normal heart sounds.  Pulmonary:     Effort: Pulmonary effort is normal.     Breath sounds: Normal breath sounds.  Chest:     Comments: Right breast benign; left breast s/p lumpectomy and radiation, no nodule noted, +TTP throughout Abdominal:     General: Abdomen is flat. Bowel sounds are normal. There is no distension.     Palpations: Abdomen is soft.     Tenderness: There is no abdominal tenderness.  Musculoskeletal:        General: No swelling.     Cervical back: Neck supple.  Lymphadenopathy:     Cervical: No cervical adenopathy.     Upper Body:     Right upper body: No supraclavicular or axillary adenopathy.     Left upper body: No supraclavicular or axillary adenopathy.  Skin:    General: Skin is warm and dry.     Findings: No rash.  Neurological:     General: No focal  deficit present.     Mental Status: She is alert.  Psychiatric:        Mood and Affect: Mood normal.        Behavior: Behavior normal.      ASSESSMENT and THERAPY PLAN:   Assessment and Plan Assessment & Plan Triple negative breast cancer, status post neoadjuvant chemotherapy, lumpectomy, and adjuvant radiation New breast pain and palpable masses with concern for recurrence despite recent negative mammogram. - Order mammogram and ultrasound to evaluate breast changes. - Consider lab test for circulating tumor DNA to detect recurrence. - Review imaging results and adjust management as necessary.  Intractable nausea and vomiting with significant weight loss Persistent nausea and vomiting with significant weight loss and concern for electrolyte imbalances. - Order blood tests to check electrolytes and blood count. - Follow up with GI specialist as scheduled. - Advise to return to emergency room if symptoms worsen.  The patient was provided an opportunity to ask questions and all were answered. The patient agreed with the plan and demonstrated an understanding of the instructions.   The patient was advised to call back or seek an in-person evaluation if the symptoms worsen or if the condition fails to improve as anticipated.   I provided 20 minutes of non face-to-face telephone visit time during this encounter, and > 50% was spent counseling as documented under my assessment & plan.   Morna Kendall, NP 09/03/24 10:58 AM Medical Oncology and Hematology [facility] [address] [place], [place] [address] Tel. [phone]    Fax. [phone]  *Total Encounter Time as defined by the Centers for Medicare and Medicaid Services includes, in addition to the face-to-face time of a patient visit (documented in the note above) non-face-to-face time: obtaining and reviewing outside history, ordering and reviewing medications, tests or procedures, care coordination  (communications with other health care professionals or caregivers) and documentation in the medical record.

## 2024-09-04 ENCOUNTER — Other Ambulatory Visit: Payer: Self-pay | Admitting: Family Medicine

## 2024-09-04 ENCOUNTER — Other Ambulatory Visit (HOSPITAL_COMMUNITY): Payer: Self-pay

## 2024-09-04 ENCOUNTER — Other Ambulatory Visit: Payer: Self-pay

## 2024-09-04 ENCOUNTER — Other Ambulatory Visit: Payer: Self-pay | Admitting: Adult Health

## 2024-09-04 DIAGNOSIS — I10 Essential (primary) hypertension: Secondary | ICD-10-CM

## 2024-09-04 DIAGNOSIS — R600 Localized edema: Secondary | ICD-10-CM

## 2024-09-07 ENCOUNTER — Telehealth: Payer: Self-pay

## 2024-09-07 ENCOUNTER — Ambulatory Visit: Payer: Self-pay

## 2024-09-07 ENCOUNTER — Telehealth: Payer: Self-pay | Admitting: Physician Assistant

## 2024-09-07 ENCOUNTER — Encounter: Payer: Self-pay | Admitting: Adult Health

## 2024-09-07 DIAGNOSIS — R001 Bradycardia, unspecified: Secondary | ICD-10-CM | POA: Diagnosis not present

## 2024-09-07 DIAGNOSIS — R55 Syncope and collapse: Secondary | ICD-10-CM | POA: Diagnosis not present

## 2024-09-07 NOTE — Telephone Encounter (Signed)
 Noted. Is she taking oxycodone ?

## 2024-09-07 NOTE — Telephone Encounter (Signed)
 Inbound call from patient's primary care office requesting for patient to be seen sooner than 10/21. Requesting a call back to the patient is a sooner appointment is available. Please advise, thank you

## 2024-09-07 NOTE — Patient Instructions (Signed)
 Erminio CROME Coley-Heath - I am sorry I was unable to reach you today for our scheduled appointment. I work with Ozell Heron HERO, MD and am calling to support your healthcare needs. Please contact me at [phone] at your earliest convenience. I look forward to speaking with you soon.   Thank you,   Jackson Acron [facility] RN Care Manager Direct Dial: [phone]  Fax: [phone] Website: [email or web address]

## 2024-09-07 NOTE — Telephone Encounter (Signed)
 She is not. Has none left.

## 2024-09-07 NOTE — Telephone Encounter (Addendum)
 Spoke to patient who complains of 3 months on and off nausea/vomiting. States she goes through episodes where she is unable to tolerate any foods and very few fluids. Denies any abdominal pain. Does feel she has some reflux at the times. Denies any hematemesis or lower GI symptoms. She states that she is supposed to take famotidine  and Nexium  daily but has not been able to keep this down. Also states she has had a 40 lb weight loss from August-present but to date, no scans/testing have shown reasoning for this. Patient says she was given some Zofran  4 mcg ODT which helps some but does not last long and she was told max dosing was 2 tablets daily.   Unfortunately, first availability with an APP is not until November (pt currently scheduled for 10/21 with B.McMichael, PA-C). First appointment with Dr Abran is 10/16. Patient has been scheduled to see Dr Abran on 09/17/24 at 1140 am.   Patient is advised to try taking zofran  ODT and waiting 30 min-1 hour then try to take Nexium  and other medications. Hopefully the Zofran  will suppress nausea/vomiting long enough to get her medications in. Patient is also advised to try to push liquids when possible, protein shakes a couple times daily as tolerated. Very small, frequent meals, bland foods for now. Advised zofran  TID prn.  If she has further episodes of intractable nausea/vomiting/inability to tolerate PO, she should be seen in the emergency room for sooner evaluation/possible IV fluid administration. She verbalizes understanding.

## 2024-09-07 NOTE — Telephone Encounter (Signed)
 Noted. Thanks.

## 2024-09-08 ENCOUNTER — Other Ambulatory Visit (HOSPITAL_COMMUNITY): Payer: Self-pay

## 2024-09-08 ENCOUNTER — Other Ambulatory Visit: Payer: Self-pay

## 2024-09-08 DIAGNOSIS — C50412 Malignant neoplasm of upper-outer quadrant of left female breast: Secondary | ICD-10-CM | POA: Diagnosis not present

## 2024-09-08 MED FILL — Levothyroxine Sodium Tab 75 MCG: ORAL | 30 days supply | Qty: 30 | Fill #4 | Status: CN

## 2024-09-09 ENCOUNTER — Other Ambulatory Visit: Payer: Self-pay

## 2024-09-09 ENCOUNTER — Ambulatory Visit

## 2024-09-09 ENCOUNTER — Telehealth: Payer: Self-pay

## 2024-09-09 ENCOUNTER — Ambulatory Visit
Admission: RE | Admit: 2024-09-09 | Discharge: 2024-09-09 | Disposition: A | Discharge status: Home / Self Care | Source: Ambulatory Visit | Attending: Adult Health | Admitting: Adult Health

## 2024-09-09 DIAGNOSIS — R928 Other abnormal and inconclusive findings on diagnostic imaging of breast: Secondary | ICD-10-CM | POA: Diagnosis not present

## 2024-09-09 DIAGNOSIS — N644 Mastodynia: Secondary | ICD-10-CM | POA: Diagnosis not present

## 2024-09-09 DIAGNOSIS — C50412 Malignant neoplasm of upper-outer quadrant of left female breast: Secondary | ICD-10-CM

## 2024-09-09 DIAGNOSIS — R234 Changes in skin texture: Secondary | ICD-10-CM

## 2024-09-09 NOTE — Progress Notes (Signed)
   09/09/2024  Patient ID: Shelby Anderson, female   DOB: [birth year], 68 y.o.   MRN: [id number]  Received fax notification from [employer] that patient has been conditionally approved to receive Farxiga  for 2026. In order to get full approval for 2026 and continue to receive product, patient needs to provide consent that they acknowledge the terms and conditions of the program.   This can be down in the following ways: 1) Patient can go to [email or web address] to provide consent via digital assistant 2) Call [phone] to provide verbal consent 3) Complete the paper application   [employer] Patient ID: [id number]   Forwarding to CPhT to assist with enrollment process for patient.   Jon VEAR Lindau, PharmD Clinical Pharmacist [phone]

## 2024-09-10 ENCOUNTER — Telehealth: Payer: Self-pay

## 2024-09-10 ENCOUNTER — Encounter: Payer: Self-pay | Admitting: Adult Health

## 2024-09-10 NOTE — Telephone Encounter (Signed)
 Gave pt a call pt is coming up due for re-enrollment on [employer] Farxiga  pt has been pre-approved for 2026,spoke with pt, pt agree to call [employer] to give c[employer]ME consent to due her PAP for 2026,provide pt with [employer] number if any question to call back.

## 2024-09-11 ENCOUNTER — Other Ambulatory Visit: Payer: Self-pay

## 2024-09-14 ENCOUNTER — Other Ambulatory Visit (HOSPITAL_COMMUNITY): Payer: Self-pay

## 2024-09-14 ENCOUNTER — Other Ambulatory Visit: Payer: Self-pay

## 2024-09-14 MED FILL — Levothyroxine Sodium Tab 75 MCG: ORAL | 30 days supply | Qty: 30 | Fill #4 | Status: CN

## 2024-09-15 ENCOUNTER — Other Ambulatory Visit: Payer: Self-pay | Admitting: Adult Health

## 2024-09-15 ENCOUNTER — Other Ambulatory Visit: Payer: Self-pay

## 2024-09-15 ENCOUNTER — Other Ambulatory Visit (HOSPITAL_COMMUNITY): Payer: Self-pay

## 2024-09-15 MED ORDER — MONTELUKAST SODIUM 10 MG PO TABS
10.0000 mg | ORAL_TABLET | Freq: Every evening | ORAL | 0 refills | Status: DC
  Filled 2024-09-15 – 2024-09-16 (×3): qty 30, 30d supply, fill #0

## 2024-09-15 NOTE — Telephone Encounter (Signed)
 Copied from CRM #[id number]. Topic: Clinical - Medication Refill >> Sep 15, 2024 12:39 PM Isabell A wrote: Medication: Rx #: [id number]  montelukast  (SINGULAIR ) 10 MG tablet [[id number]]    Has the patient contacted their pharmacy? Yes (Agent: If no, request that the patient contact the pharmacy for the refill. If patient does not wish to contact the pharmacy document the reason why and proceed with request.) (Agent: If yes, when and what did the pharmacy advise?)  This is the patient's preferred pharmacy:  [facility] [address] [place] [address] Phone: [phone] Fax: [phone]  Is this the correct pharmacy for this prescription? Yes If no, delete pharmacy and type the correct one.   Has the prescription been filled recently? Yes  Is the patient out of the medication? Yes  Has the patient been seen for an appointment in the last year OR does the patient have an upcoming appointment? Yes  Can we respond through MyChart? No  Agent: Please be advised that Rx refills may take up to 3 business days. We ask that you follow-up with your pharmacy.

## 2024-09-16 ENCOUNTER — Other Ambulatory Visit: Payer: Self-pay

## 2024-09-16 ENCOUNTER — Telehealth: Payer: Self-pay

## 2024-09-16 MED FILL — Levothyroxine Sodium Tab 75 MCG: ORAL | 30 days supply | Qty: 30 | Fill #4 | Status: AC

## 2024-09-16 NOTE — Progress Notes (Signed)
   09/16/2024  Patient ID: Shelby Anderson, female   DOB: 02-May-1956, 68 y.o.   MRN: [id number]  Received notification from [employer] that patient has been approved to continue to receive Farxiga  10mg  through 12/02/2025.  Jon VEAR Lindau, PharmD Clinical Pharmacist [phone]

## 2024-09-17 ENCOUNTER — Other Ambulatory Visit: Payer: Self-pay

## 2024-09-17 ENCOUNTER — Encounter: Payer: Self-pay | Admitting: Internal Medicine

## 2024-09-17 ENCOUNTER — Ambulatory Visit: Admitting: Internal Medicine

## 2024-09-17 ENCOUNTER — Other Ambulatory Visit (HOSPITAL_COMMUNITY): Payer: Self-pay

## 2024-09-17 VITALS — BP 110/68 | HR 76 | Ht 66.0 in | Wt 197.0 lb

## 2024-09-17 DIAGNOSIS — K219 Gastro-esophageal reflux disease without esophagitis: Secondary | ICD-10-CM

## 2024-09-17 DIAGNOSIS — R112 Nausea with vomiting, unspecified: Secondary | ICD-10-CM

## 2024-09-17 DIAGNOSIS — R634 Abnormal weight loss: Secondary | ICD-10-CM

## 2024-09-17 MED ORDER — ONDANSETRON 4 MG PO TBDP
4.0000 mg | ORAL_TABLET | Freq: Three times a day (TID) | ORAL | 6 refills | Status: DC | PRN
  Filled 2024-09-17: qty 90, 30d supply, fill #0

## 2024-09-17 NOTE — Progress Notes (Signed)
 HISTORY OF PRESENT ILLNESS:  Shelby Anderson is a 68 y.o. female with multiple significant medical problems including including chronic pain syndrome on chronic narcotics.  She presents herself today regarding chronic nausea with vomiting and weight loss.  She has been evaluated in this office for these problems previously.  Last office visit April 2025.  See that dictation.  Patient was hospitalized August 02, 2024 through August 08, 2024 for acute metabolic encephalopathy felt secondary to polypharmacy.  Abdominal pelvic CT scan was unremarkable.  CT of the head and MRI of the brain ruled out acute abnormalities but did show moderate advanced chronic signal changes in the cerebral white matter and basal ganglia with progression since 2021.  Patient tells me that because of recurrent nausea and vomiting she is unable to take her medications.  She is not eating well.  She is losing weight.  20 pounds in the past 6 months.  She continues on narcotics.  She did tell me that the dissolvable Zofran  seems to help.  For GERD she does have Nexium  and famotidine .  Has been unable to take these reliably.  Last hemoglobin A1c from August 03, 2024 was 6.5.  Her last colonoscopy in August 2022 was unremarkable.  Last upper endoscopy June 2022 revealed an esophageal stricture which was dilated.  REVIEW OF SYSTEMS:  All non-GI ROS negative except for sinus and allergy  trouble, arthritis, back pain, breast changes, visual change, depression, night sweats, muscle cramps, heart rhythm change, heart murmur, headaches, sleeping problems, lower extremity swelling, shortness of breath  Past Medical History:  Diagnosis Date   Allergy     Anemia    chronic   Angina    Prinzmetal angina   Anxiety and depression    Arthritis    Asthma    Atrial fibrillation (HCC)    h/o AF w/frequent PVCs   Breast cancer (HCC)    Left   Cancer (HCC)    hx of skin cancer    CHF (congestive heart failure) (HCC)     Chronic back pain greater than 3 months duration    on chronic narcotics, treated at pain clinic   Chronic respiratory failure (HCC) 09/15/2015   ONO 09/04/15 + desats >begin O2 at 2l/ m    Colon polyps    hyperplastic   Coronary artery disease    Arrythmia, orthostatic hypotension, HLD, HTN; sees Dr. Ladona   Difficult intubation    TMJ & woke up when they were still cutting on me   Dysrhythmia    sees Dr. 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 Ladona   OSA (obstructive sleep apnea)    Personal history of chemotherapy    Personal history of radiation therapy  Pneumonia    multiple times   PONV (postoperative nausea and vomiting)    Recurrent upper respiratory infection (URI)    S/P left TKA 09/25/2016   Shortness of breath 11/20/11   all the time, sees pulmonlogy, ? asthma   Stenotic cervical os    Stomach ulcer    3 small; found in 05/2011   TMJ (dislocation of temporomandibular joint)    Tuberculosis    + TB SKIN TEST   Type 2 diabetes mellitus without complication, without long-term current use of insulin  (HCC) 12/06/2015   not on meds     Past Surgical History:  Procedure Laterality Date   ACHILLES TENDON REPAIR  1970's   left ankle   ARTHROSCOPIC REPAIR ACL     left knee cap   BREAST BIOPSY Right 04/08/2013   again in October or November 2020   BREAST LUMPECTOMY Left 12/09/2019    BREAST LUMPECTOMY WITH RADIOACTIVE SEED AND SENTINEL LYMPH NODE BIOPSY Left 12/09/2019   Procedure: LEFT BREAST LUMPECTOMY WITH RADIOACTIVE SEED AND LEFT AXILLARY SENTINEL LYMPH NODE BIOPSY;  Surgeon: Ebbie Cough, MD;  Location: [facility] OR;  Service: General;  Laterality: Left;  PEC BLOCK   CARDIAC CATHETERIZATION     loop recorder   CARPAL TUNNEL RELEASE  unknown   left hand   COLONOSCOPY     ESOPHAGOGASTRODUODENOSCOPY (EGD) WITH PROPOFOL  N/A 10/29/2017   Procedure: ESOPHAGOGASTRODUODENOSCOPY (EGD) WITH PROPOFOL ;  Surgeon: Abran Norleen SAILOR, MD;  Location: [facility]COPY;  Service: Endoscopy;  Laterality: N/A;   LOOP RECORDER IMPLANT     PORT-A-CATH REMOVAL N/A 12/09/2019   Procedure: REMOVAL PORT-A-CATH;  Surgeon: Ebbie Cough, MD;  Location: [facility] OR;  Service: General;  Laterality: N/A;   PORTACATH PLACEMENT N/A 09/23/2019   Procedure: INSERTION PORT-A-CATH Right Internal Brooke RATER;  Surgeon: Ebbie Cough, MD;  [facility];  Service: General;  Laterality: N/A;   post ganglionectomy  1970's   for migraine headaches   pouch string  [phone]   did this 3 times (once w/each pregnancy)   SAVORY DILATION N/A 10/29/2017   Procedure: SAVORY DILATION;  Surgeon: Abran Norleen SAILOR, MD;  Location: [facility]COPY;  Service: Endoscopy;  Laterality: N/A;   TOTAL KNEE ARTHROPLASTY Left 09/25/2016   Procedure: LEFT TOTAL KNEE ARTHROPLASTY;  Surgeon: Cough Car, MD;  Location: [facility] ORS;  Service: Orthopedics;  Laterality: Left;   TUBAL LIGATION  1980's    Social History RAFAELLA KOLE  reports that she has never smoked. She has never used smokeless tobacco. She reports that she does not drink alcohol and does not use drugs.  family history includes Cancer in her brother, brother, father, paternal grandmother, sister, and sister; Cancer (age of onset: 61) in her brother; Cancer (age of onset: 59) in her nephew; Diabetes in her father and sister; Heart disease in her father,  maternal grandfather, maternal grandmother, mother, paternal grandfather, paternal grandmother, and sister; Hypertension in her father, mother, sister, and sister; Malignant hyperthermia in her father; Multiple myeloma in her mother; Other (age of onset: 63) in her maternal grandmother; Pancreatic cancer (age of onset: 70) in her niece.  Allergies  Allergen Reactions   Lodine [Etodolac] Anaphylaxis, Hives and Swelling   Oxycontin  [Oxycodone  Hcl] Anaphylaxis    hives, trouble breathing, tongue swelling (Only Oxycontin ) Tolerates plain oxycodone .   Penicillins Anaphylaxis    Told by a surgeon never to take it again.   Aspirin  Other (See Comments)    High-dose caused GI Bleeds   Darvocet [Propoxyphene  N-Acetaminophen ] Hives   Nitroglycerin  Other (See Comments)    IV-BP drops dramatically Can take po   Propoxyphene Hives   Tramadol Hives and Itching   Valium Other (See Comments)    Circulation problems. Legs turned black.       PHYSICAL EXAMINATION: Vital signs: BP 110/68   Pulse 76   Ht 5' 6 (1.676 m)   Wt 197 lb (89.4 kg)   BMI 31.80 kg/m   Constitutional: Chronically ill-appearing, no acute distress Psychiatric: alert and oriented x3, cooperative Eyes: extraocular movements intact, anicteric, conjunctiva pink Mouth: oral pharynx moist, no lesions.  Poor dentition Neck: supple no lymphadenopathy Cardiovascular: heart regular rate and rhythm, no murmur Lungs: clear to auscultation bilaterally Abdomen: soft, obese, nontender, nondistended, no obvious ascites, no peritoneal signs, normal bowel sounds, no organomegaly Rectal: Omitted Extremities: no clubbing or cyanosis.  Trace lower extremity edema bilaterally Skin: no lesions on visible extremities Neuro: No focal deficits.  Cranial nerves intact  ASSESSMENT:  1.  Chronic nausea with vomiting.  Likely narcotic related.  Rule out gastroparesis 2.  Chronic GERD.  Poorly controlled off medication 3.  Diabetes mellitus 4.   Multiple significant medical problems 5.  Prior colonoscopy and upper endoscopy as described   PLAN:  1.  Prescribing dissolvable sublingual Zofran  4 mg.  Take 20 minutes before medications and meals (up to 3 times per day). 2.  Increase Nexium  to twice daily 3.  Schedule gastric emptying scan to rule out gastroparesis 4.  Routine GI follow-up 2 months. Total time of 45 minutes was spent preparing to see the patient, reviewing multiple records and studies, obtaining comprehensive history, performing medically appropriate physical exam, counseling and educating the patient regarding the above listed issues, ordering medication, ordering advanced radiology study, arranging follow-up, and documenting clinical information in the health record

## 2024-09-17 NOTE — Patient Instructions (Signed)
 We have sent the following medications to your pharmacy for you to pick up at your convenience:  Zofran   You have been scheduled for a gastric emptying scan at [facility] on 10/05/2024 at 7:30am. Please arrive at least 30 minutes prior to your appointment for registration. Please make certain not to have anything to eat or drink after midnight the night before your test. Hold all stomach medications (ex: Zofran , phenergan , Reglan ) 24 hours prior to your test. If you need to reschedule your appointment, please contact radiology scheduling at [phone]. _____________________________________________________________________ A gastric-emptying study measures how long it takes for food to move through your stomach. There are several ways to measure stomach emptying. In the most common test, you eat food that contains a small amount of radioactive material. A scanner that detects the movement of the radioactive material is placed over your abdomen to monitor the rate at which food leaves your stomach. This test normally takes about 4 hours to complete. _____________________________________________________________________  _______________________________________________________  If your blood pressure at your visit was 140/90 or greater, please contact your primary care physician to follow up on this.  _______________________________________________________  If you are age 3 or older, your body mass index should be between 23-30. Your Body mass index is 31.8 kg/m. If this is out of the aforementioned range listed, please consider follow up with your Primary Care Provider.  If you are age 39 or younger, your body mass index should be between 19-25. Your Body mass index is 31.8 kg/m. If this is out of the aformentioned range listed, please consider follow up with your Primary Care Provider.   ________________________________________________________  The [facility] providers would like to  encourage you to use MYCHART to communicate with providers for non-urgent requests or questions.  Due to long hold times on the telephone, sending your provider a message by [facility] may be a faster and more efficient way to get a response.  Please allow 48 business hours for a response.  Please remember that this is for non-urgent requests.  _______________________________________________________  [facility] is using a team-based approach to care.  Your team is made up of your doctor and two to three APPS. Our APPS (Nurse Practitioners and Physician Assistants) work with your physician to ensure care continuity for you. They are fully qualified to address your health concerns and develop a treatment plan. They communicate directly with your gastroenterologist to care for you. Seeing the Advanced Practice Practitioners on your physician's team can help you by facilitating care more promptly, often allowing for earlier appointments, access to diagnostic testing, procedures, and other specialty referrals.

## 2024-09-22 ENCOUNTER — Ambulatory Visit: Admitting: Gastroenterology

## 2024-09-23 ENCOUNTER — Other Ambulatory Visit: Payer: Self-pay | Admitting: Family Medicine

## 2024-09-23 ENCOUNTER — Other Ambulatory Visit (HOSPITAL_COMMUNITY): Payer: Self-pay

## 2024-09-23 MED ORDER — DOXEPIN HCL 50 MG PO CAPS
50.0000 mg | ORAL_CAPSULE | Freq: Every day | ORAL | 1 refills | Status: AC
  Filled 2024-09-23 – 2024-09-24 (×4): qty 90, 90d supply, fill #0

## 2024-09-24 ENCOUNTER — Inpatient Hospital Stay: Admitting: Adult Health

## 2024-09-24 ENCOUNTER — Inpatient Hospital Stay

## 2024-09-24 ENCOUNTER — Encounter: Payer: Self-pay | Admitting: Adult Health

## 2024-09-24 ENCOUNTER — Other Ambulatory Visit: Payer: Self-pay

## 2024-09-24 ENCOUNTER — Other Ambulatory Visit (HOSPITAL_COMMUNITY): Payer: Self-pay

## 2024-09-24 DIAGNOSIS — C50412 Malignant neoplasm of upper-outer quadrant of left female breast: Secondary | ICD-10-CM | POA: Diagnosis not present

## 2024-09-24 DIAGNOSIS — Z171 Estrogen receptor negative status [ER-]: Secondary | ICD-10-CM | POA: Diagnosis not present

## 2024-09-24 LAB — CBC WITH DIFFERENTIAL (CANCER CENTER ONLY)
Abs Immature Granulocytes: 0.02 K/uL (ref 0.00–0.07)
Basophils Absolute: 0 K/uL (ref 0.0–0.1)
Basophils Relative: 1 %
Eosinophils Absolute: 0.2 K/uL (ref 0.0–0.5)
Eosinophils Relative: 3 %
HCT: 40 % (ref 36.0–46.0)
Hemoglobin: 12.9 g/dL (ref 12.0–15.0)
Immature Granulocytes: 0 %
Lymphocytes Relative: 13 %
Lymphs Abs: 0.9 K/uL (ref 0.7–4.0)
MCH: 28.7 pg (ref 26.0–34.0)
MCHC: 32.3 g/dL (ref 30.0–36.0)
MCV: 88.9 fL (ref 80.0–100.0)
Monocytes Absolute: 0.4 K/uL (ref 0.1–1.0)
Monocytes Relative: 6 %
Neutro Abs: 5.7 K/uL (ref 1.7–7.7)
Neutrophils Relative %: 77 %
Platelet Count: 236 K/uL (ref 150–400)
RBC: 4.5 MIL/uL (ref 3.87–5.11)
RDW: 15.4 % (ref 11.5–15.5)
WBC Count: 7.3 K/uL (ref 4.0–10.5)
nRBC: 0 % (ref 0.0–0.2)

## 2024-09-24 LAB — GUARDANT REVEAL

## 2024-09-24 LAB — CMP (CANCER CENTER ONLY)
ALT: 24 U/L (ref 0–44)
AST: 28 U/L (ref 15–41)
Albumin: 4 g/dL (ref 3.5–5.0)
Alkaline Phosphatase: 66 U/L (ref 38–126)
Anion gap: 8 (ref 5–15)
BUN: 15 mg/dL (ref 8–23)
CO2: 30 mmol/L (ref 22–32)
Calcium: 9.5 mg/dL (ref 8.9–10.3)
Chloride: 106 mmol/L (ref 98–111)
Creatinine: 0.96 mg/dL (ref 0.44–1.00)
GFR, Estimated: >60 mL/min (ref >60)
Glucose, Bld: 152 mg/dL — ABNORMAL HIGH (ref 70–99)
Potassium: 3.7 mmol/L (ref 3.5–5.1)
Sodium: 144 mmol/L (ref 135–145)
Total Bilirubin: 0.5 mg/dL (ref 0.0–1.2)
Total Protein: 7.1 g/dL (ref 6.5–8.1)

## 2024-09-24 LAB — MAGNESIUM: Magnesium: 1.1 mg/dL — ABNORMAL LOW (ref 1.7–2.4)

## 2024-09-24 NOTE — Progress Notes (Signed)
 [facility] Cancer Follow up:    Ozell Heron HERO, MD [address] [place] [address]   DIAGNOSIS:  Cancer Staging  Malignant neoplasm of upper-outer quadrant of left breast in female, estrogen receptor negative (HCC) Staging form: Breast, AJCC 8th Edition - Clinical stage from 09/09/2019: Stage IIB (cT2, cN0, cM0, G3, ER-, PR-, HER2-) - Unsigned Stage prefix: Initial diagnosis Histologic grading system: 3 grade system - Pathologic stage from 12/10/2019: ypT1a, ypN0, cM0 - Signed by Crawford Morna Pickle, NP on 12/23/2019 Stage prefix: Post-therapy    SUMMARY OF ONCOLOGIC HISTORY: Oncology History  Malignant neoplasm of upper-outer quadrant of left breast in female, estrogen receptor negative (HCC)  09/07/2019 Initial Diagnosis   Routine screening mammogram detected a 2.1cm mass in the left breast and no left axillary adenopathy. 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   09/15/2019 Genetic Testing   Genetic testing reported out on September 15, 2019 through the Multi-cancer panel found no pathogenic mutations. The Multi-Gene Panel offered by [facility] includes sequencing and/or deletion duplication testing of the following 85 genes: AIP, ALK, APC, ATM, AXIN2,BAP1,  BARD1, BLM, BMPR1A, BRCA1, BRCA2, BRIP1, CASR, CDC73, CDH1, CDK4, CDKN1B, CDKN1C, CDKN2A (p14ARF), CDKN2A (p16INK4a), CEBPA, CHEK2, CTNNA1, DICER1, DIS3L2, EGFR (c.2369C>T, p.Thr790Met variant only), EPCAM (Deletion/duplication testing only), FH, FLCN, GATA2, GPC3, GREM1 (Promoter region deletion/duplication testing only), HOXB13 (c.251G>A, p.Gly84Glu), HRAS, KIT, MAX, MEN1, MET, MITF (c.952G>A, p.Glu318Lys variant only), MLH1, MSH2, MSH3, MSH6, MUTYH, NBN, NF1, NF2, NTHL1, PALB2, PDGFRA, PHOX2B, PMS2, POLD1, POLE, POT1, PRKAR1A, PTCH1, PTEN, RAD50, RAD51C, RAD51D, RB1, RECQL4, RET, RNF43, RUNX1, SDHAF2, SDHA (sequence changes only), SDHB, SDHC, SDHD, SMAD4, SMARCA4, SMARCB1,  SMARCE1, STK11, SUFU, TERC, TERT, TMEM127, TP53, TSC1, TSC2, VHL, WRN and WT1.  The test report has been scanned into EPIC and is located under the Molecular Pathology section of the Results Review tab.  A portion of the result report is included below for reference.    09/24/2019 - 10/15/2019 Chemotherapy   palonosetron  (ALOXI ) injection 0.25 mg, 0.25 mg, Intravenous,  Once, 2 of 6 cycles. Administration: 0.25 mg (09/24/2019), 0.25 mg (10/15/2019)  pegfilgrastim  (NEULASTA  ONPRO KIT) injection 6 mg, 6 mg, Subcutaneous, Once, 2 of 6 cycles. Administration: 6 mg (09/24/2019), 6 mg (10/15/2019)  cyclophosphamide  (CYTOXAN ) 1,100 mg in sodium chloride  0.9 % 250 mL chemo infusion, 500 mg/m2 = 1,100 mg (83.3 % of original dose 600 mg/m2), Intravenous,  Once, 2 of 6 cycles. Dose modification: 500 mg/m2 (original dose 600 mg/m2, Cycle 1, Reason: Provider Judgment). Administration: 1,100 mg (09/24/2019), 1,100 mg (10/15/2019)  DOCEtaxel  (TAXOTERE ) 140 mg in sodium chloride  0.9 % 250 mL chemo infusion, 65 mg/m2 = 140 mg (86.7 % of original dose 75 mg/m2), Intravenous,  Once, 2 of 6 cycles. Dose modification: 65 mg/m2 (original dose 75 mg/m2, Cycle 1, Reason: Provider Judgment). Administration: 140 mg (09/24/2019), 140 mg (10/15/2019)  fosaprepitant  (EMEND) 150 mg in sodium chloride  0.9 % 145 mL IVPB, Intravenous,  Once, 2 of 6 cycles. Administration:  (09/24/2019),  (10/15/2019)   12/09/2019 Surgery   Left lumpectomy Viktor[place]) [phone]): microscopic focus of residual IDC, grade 3, with high grade DCIS, clear margins. No regional lymph nodes were examined.   12/10/2019 Cancer Staging   Staging form: Breast, AJCC 8th Edition - Pathologic stage from 12/10/2019: No Stage Recommended (ypT1a, pN0, cM0) - Signed by Crawford Morna Pickle, NP on 12/23/2019   01/13/2020 - 02/08/2020 Radiation Therapy   The patient initially received a dose  of 42.56 Gy in 16 fractions to the breast using whole-breast tangent fields.  This was delivered using a 3-D conformal technique. The pt received a boost delivering an additional 8 Gy in 4 fractions using a electron boost with electrons. The total dose was 50.56 Gy.     CURRENT THERAPY: observation  INTERVAL HISTORY:  Discussed the use of AI scribe software for clinical note transcription with the patient, who gave verbal consent to proceed.  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  A bilateral breast mammogram on September 09, 2024, shows no mammographic evidence of malignancy with breast density category C. 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 Recent blood testing in October 2025 was negative.  She experiences ongoing gastrointestinal issues, currently under evaluation by a gastroenterologist. She expresses anxiety about her symptoms. She is on a heart monitor and has not started blood pressure medications yet, with decreased blood pressure noted this morning. She maintains hydration with flavored water  and takes magnesium  supplements due to low levels. She uses Ensure for nutrition when unable to eat.     Patient Active Problem List   Diagnosis Date Noted   Acute encephalopathy 08/03/2024   Moderate episode of recurrent major depressive disorder (HCC) 02/10/2024   UTI (urinary tract infection) 08/01/2023   Nausea and vomiting 07/31/2023   Acute cystitis 07/31/2023   Hypothyroidism 09/11/2022   Dental caries 06/12/2022   Spondylosis without myelopathy or radiculopathy, cervical region 04/21/2021   Neutropenia with fever 10/01/2019   Genetic testing 09/17/2019   Family history of melanoma    Family history of pancreatic cancer     Malignant neoplasm of upper-outer quadrant of left breast in female, estrogen receptor negative (HCC) 09/07/2019   Encounter for loop recorder at end of battery life 04/24/2018   Esophageal stricture 07/01/2017   Hyperlipidemia associated with type 2 diabetes mellitus (HCC) 05/20/2017   Allergic rhinitis 03/11/2017   Dilated cardiomyopathy (HCC) 02/07/2017   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 11/13/2016   Morbid obesity due to excess calories (HCC) 09/26/2016   Increased endometrial stripe thickness 06/27/2016   Intramural leiomyoma of uterus 06/27/2016   Type 2 diabetes mellitus without complication, without long-term current use of insulin  (HCC) 12/06/2015   HTN (hypertension), benign 07/19/2015   Arrhythmia 07/19/2015   Orthostatic hypotension 07/19/2015   GERD (gastroesophageal reflux disease) 07/19/2015   Chronic back pain 07/19/2015   Neuropathy 07/19/2015   Symptomatic PVCs 11/02/2014   Syncope 11/02/2014   Status post placement of implantable loop recorder 11/02/2014   Stenosis of cervix 01/07/2014   Chest pain 12/23/2013   Disorder of cervix 03/10/2013   Vaginal atrophy 03/10/2013   OSA (obstructive sleep apnea) 07/31/2012   Asthma 04/29/2012   Coronary artery disease 11/20/2011    is allergic to lodine [etodolac], oxycontin  [oxycodone  hcl], penicillins, aspirin , darvocet [propoxyphene n-acetaminophen ], nitroglycerin , propoxyphene, tramadol, and valium.  MEDICAL HISTORY: Past Medical History:  Diagnosis Date   Allergy     Anemia    chronic   Angina    Prinzmetal angina   Anxiety and depression    Arthritis    Asthma    Atrial fibrillation (HCC)    h/o AF w/frequent PVCs   Breast cancer (HCC)  Left   Cancer (HCC)    hx of skin cancer    CHF (congestive heart failure) (HCC)    Chronic back pain greater than 3 months duration    on chronic narcotics, treated at pain clinic   Chronic respiratory failure (HCC) 09/15/2015   ONO 09/04/15 + desats  >begin O2 at 2l/ m    Colon polyps    hyperplastic   Coronary artery disease    Arrythmia, orthostatic hypotension, HLD, HTN; sees Dr. Ladona   Difficult intubation    TMJ & woke up when they were still cutting on me   Dysrhythmia    sees Dr. Ladona and a cardiologist at [facility]   Esophageal stricture    Family history of melanoma ]   Family history of pancreatic cancer    Fatty liver    Fatty liver    Fibroids    Fibromyalgia    in my legs   GERD (gastroesophageal reflux disease)    hx hiatal hernia, stricture and gastric ulcer   Headache(784.0)    migraines   Heart murmur    Hiatal hernia    History of loop recorder    History of migraines    dx'd when I was in my teens   Hyperlipemia    Hypertension    Mental disorder    Mild episode of recurrent major depressive disorder 12/06/2015   Myocardial infarction [facility]) 1980's & 1990;   sees Dr. Ladona   OSA (obstructive sleep apnea)    Personal history of chemotherapy    Personal history of radiation therapy    Pneumonia    multiple times   PONV (postoperative nausea and vomiting)    Recurrent upper respiratory infection (URI)    S/P left TKA 09/25/2016   Shortness of breath 11/20/11   all the time, sees pulmonlogy, ? asthma   Stenotic cervical os    Stomach ulcer    3 small; found in 05/2011   TMJ (dislocation of temporomandibular joint)    Tuberculosis    + TB SKIN TEST   Type 2 diabetes mellitus without complication, without long-term current use of insulin  (HCC) 12/06/2015   not on meds     SURGICAL HISTORY: Past Surgical History:  Procedure Laterality Date   ACHILLES TENDON REPAIR  1970's   left ankle   ARTHROSCOPIC REPAIR ACL     left knee cap   BREAST BIOPSY Right 04/08/2013   again in October or November 2020   BREAST LUMPECTOMY Left 12/09/2019   BREAST LUMPECTOMY WITH RADIOACTIVE SEED AND SENTINEL LYMPH NODE BIOPSY Left 12/09/2019   Procedure: LEFT BREAST LUMPECTOMY WITH RADIOACTIVE SEED AND  LEFT AXILLARY SENTINEL LYMPH NODE BIOPSY;  Surgeon: Ebbie Cough, MD;  Location: [facility] OR;  Service: General;  Laterality: Left;  PEC BLOCK   CARDIAC CATHETERIZATION     loop recorder   CARPAL TUNNEL RELEASE  unknown   left hand   COLONOSCOPY     ESOPHAGOGASTRODUODENOSCOPY (EGD) WITH PROPOFOL  N/A 10/29/2017   Procedure: ESOPHAGOGASTRODUODENOSCOPY (EGD) WITH PROPOFOL ;  Surgeon: Abran Norleen SAILOR, MD;  Location: [facility] ENDOSCOPY;  Service: Endoscopy;  Laterality: N/A;   LOOP RECORDER IMPLANT     PORT-A-CATH REMOVAL N/A 12/09/2019   Procedure: REMOVAL PORT-A-CATH;  Surgeon: Ebbie Cough, MD;  Location: [facility] OR;  Service: General;  Laterality: N/A;   PORTACATH PLACEMENT N/A 09/23/2019   Procedure: INSERTION PORT-A-CATH Right Internal Brooke RATER;  Surgeon: Ebbie Cough, MD;  Location: [facility] OR;  Service: General;  Laterality:  N/A;   post ganglionectomy  1970's   for migraine headaches   pouch string  [phone]   did this 3 times (once w/each pregnancy)   SAVORY DILATION N/A 10/29/2017   Procedure: SAVORY DILATION;  Surgeon: Abran Norleen SAILOR, MD;  Location: [facility] ENDOSCOPY;  Service: Endoscopy;  Laterality: N/A;   TOTAL KNEE ARTHROPLASTY Left 09/25/2016   Procedure: LEFT TOTAL KNEE ARTHROPLASTY;  Surgeon: Donnice Car, MD;  Location: [facility] ORS;  Service: Orthopedics;  Laterality: Left;   TUBAL LIGATION  1980's    SOCIAL HISTORY: Social History   Socioeconomic History   Marital status: Married    Spouse name: Not on file   Number of children: 3   Years of education: 15   Highest education level: Some college, no degree  Occupational History   Occupation: Retired [employer]  Tobacco Use   Smoking status: Never   Smokeless tobacco: Never  Vaping Use   Vaping status: Never Used  Substance and Sexual Activity   Alcohol use: No    Alcohol/week: 0.0 standard drinks of alcohol   Drug use: No   Sexual activity: Not Currently    Birth control/protection: Surgical    Comment: 1st  intercourse 68 yo-Fewer than 5 partners  Other Topics Concern   Not on file  Social History Narrative   Right handed   One story home   3 children   Has custody of 2 grandchildren ages 21 & 60    Social Drivers of Corporate Investment Banker Strain: Low Risk  (07/09/2024)   Overall Financial Resource Strain (CARDIA)    Difficulty of Paying Living Expenses: Not very hard  Food Insecurity: No Food Insecurity (08/12/2024)   Hunger Vital Sign    Worried About Running Out of Food in the Last Year: Never true    Ran Out of Food in the Last Year: Never true  Transportation Needs: No Transportation Needs (08/12/2024)   PRAPARE - Administrator, Civil Service (Medical): No    Lack of Transportation (Non-Medical): No  Physical Activity: Sufficiently Active (09/27/2023)   Exercise Vital Sign    Days of Exercise per Week: 3 days    Minutes of Exercise per Session: 60 min  Stress: No Stress Concern Present (07/09/2024)   [employer] of Occupational Health - Occupational Stress Questionnaire    Feeling of Stress: Not at all  Social Connections: Unknown (08/03/2024)   Social Connection and Isolation Panel    Frequency of Communication with Friends and Family: Three times a week    Frequency of Social Gatherings with Friends and Family: More than three times a week    Attends Religious Services: 1 to 4 times per year    Active Member of [employer] or Organizations: Not on file    Attends Banker Meetings: Not on file    Marital Status: Not on file  Intimate Partner Violence: Not At Risk (08/12/2024)   Humiliation, Afraid, Rape, and Kick questionnaire    Fear of Current or Ex-Partner: No    Emotionally Abused: No    Physically Abused: No    Sexually Abused: No    FAMILY HISTORY: Family History  Problem Relation Age of Onset   Malignant hyperthermia Father    Hypertension Father    Heart disease Father    Diabetes Father    Cancer Father        skin   Hypertension  Mother    Heart disease Mother    Multiple  myeloma Mother    Cancer Sister        CERVICAL   Hypertension Sister    Cancer Brother 4       MELANOMA   Heart disease Maternal Grandmother    Other Maternal Grandmother 32       complications of childbirth   Heart disease Maternal Grandfather    Cancer Paternal Grandmother        ?    Heart disease Paternal Grandmother    Heart disease Paternal Grandfather    Cancer Brother        LUNG   Diabetes Sister    Hypertension Sister    Heart disease Sister    Cancer Sister    Cancer Brother    Pancreatic cancer Niece 49   Cancer Nephew 40       unknown- currently in the [employer]   Anesthesia problems Neg Hx    Hypotension Neg Hx    Pseudochol deficiency Neg Hx    Colon cancer Neg Hx    Esophageal cancer Neg Hx    Rectal cancer Neg Hx    Stomach cancer Neg Hx    Breast cancer Neg Hx     Review of Systems  Constitutional:  Negative for appetite change, chills, fatigue, fever and unexpected weight change.  HENT:   Negative for hearing loss, lump/mass and trouble swallowing.   Eyes:  Negative for eye problems and icterus.  Respiratory:  Negative for chest tightness, cough and shortness of breath.   Cardiovascular:  Negative for chest pain, leg swelling and palpitations.  Gastrointestinal:  Positive for nausea. Negative for abdominal distention, abdominal pain, constipation, diarrhea and vomiting.  Endocrine: Negative for hot flashes.  Genitourinary:  Negative for difficulty urinating.   Musculoskeletal:  Negative for arthralgias.  Skin:  Negative for itching and rash.  Neurological:  Negative for dizziness, extremity weakness, headaches and numbness.  Hematological:  Negative for adenopathy. Does not bruise/bleed easily.  Psychiatric/Behavioral:  Negative for depression. The patient is not nervous/anxious.       PHYSICAL EXAMINATION     Vitals:   09/24/24 1138  BP: 101/61  Pulse: 83  Resp: 16  Temp: 97.6 F (36.4 C)   SpO2: 95%    Physical Exam Constitutional:      General: She is not in acute distress.    Appearance: Normal appearance. She is not toxic-appearing.  HENT:     Head: Normocephalic and atraumatic.     Mouth/Throat:     Mouth: Mucous membranes are moist.     Pharynx: Oropharynx is clear. No oropharyngeal exudate or posterior oropharyngeal erythema.  Eyes:     General: No scleral icterus. Cardiovascular:     Rate and Rhythm: Normal rate and regular rhythm.     Pulses: Normal pulses.     Heart sounds: Normal heart sounds.  Pulmonary:     Effort: Pulmonary effort is normal.     Breath sounds: Normal breath sounds.  Abdominal:     General: Abdomen is flat. Bowel sounds are normal. There is no distension.     Palpations: Abdomen is soft.     Tenderness: There is no abdominal tenderness.  Musculoskeletal:        General: No swelling.     Cervical back: Neck supple.  Lymphadenopathy:     Cervical: No cervical adenopathy.  Skin:    General: Skin is warm and dry.     Findings: No rash.  Neurological:     General: No  focal deficit present.     Mental Status: She is alert.  Psychiatric:        Mood and Affect: Mood normal.        Behavior: Behavior normal.     LABORATORY DATA:  CBC    Component Value Date/Time   WBC 7.3 09/24/2024 1221   WBC 7.5 08/08/2024 0526   RBC 4.50 09/24/2024 1221   HGB 12.9 09/24/2024 1221   HCT 40.0 09/24/2024 1221   PLT 236 09/24/2024 1221   MCV 88.9 09/24/2024 1221   MCH 28.7 09/24/2024 1221   MCHC 32.3 09/24/2024 1221   RDW 15.4 09/24/2024 1221   LYMPHSABS 0.9 09/24/2024 1221   MONOABS 0.4 09/24/2024 1221   EOSABS 0.2 09/24/2024 1221   BASOSABS 0.0 09/24/2024 1221    CMP     Component Value Date/Time   NA 144 09/24/2024 1221   NA 141 08/14/2024 1353   K 3.7 09/24/2024 1221   CL 106 09/24/2024 1221   CO2 30 09/24/2024 1221   GLUCOSE 152 (H) 09/24/2024 1221   BUN 15 09/24/2024 1221   BUN 16 08/14/2024 1353   CREATININE 0.96  09/24/2024 1221   CREATININE 0.67 09/30/2020 1533   CALCIUM  9.5 09/24/2024 1221   PROT 7.1 09/24/2024 1221   ALBUMIN 4.0 09/24/2024 1221   AST 28 09/24/2024 1221   ALT 24 09/24/2024 1221   ALKPHOS 66 09/24/2024 1221   BILITOT 0.5 09/24/2024 1221   GFRNONAA >60 09/24/2024 1221   GFRAA >60 12/08/2019 1025   GFRAA >60 11/05/2019 1100     ASSESSMENT and THERAPY PLAN:   No problem-specific Assessment & Plan notes found for this encounter.  Assessment and Plan Assessment & Plan Surveillance for recurrence of left breast invasive ductal carcinoma, triple negative, stage 2B, status post neoadjuvant chemotherapy, lumpectomy, and adjuvant radiation Completed treatment in 2021. Recent imaging and Guardant Reveal minimal residual disease testing indicate no evidence of malignancy.  - Continue routine surveillance with annual mammogram. - Continue Guardant reveal testing every 6 months  Gastrointestinal symptoms under evaluation Symptoms persist but are less severe. Etiology unclear. Negative cancer tests suggest symptoms are not cancer-related. - Continue follow-up with gastroenterology for further evaluation of GI symptoms.  Hypomagnesemia Diagnosed with hypomagnesemia, currently on magnesium  supplements, well-tolerated. - Repeat laboratory tests today to monitor magnesium  levels. - Defer to PCP and/or GI for future f/u and management of magnesium  levels  All questions were answered. The patient knows to call the clinic with any problems, questions or concerns. We can certainly see the patient much sooner if necessary.  Total encounter time:30 minutes*in face-to-face visit time, chart review, lab review, care coordination, order entry, and documentation of the encounter time.    Morna Kendall, NP 09/25/24 9:50 PM Medical Oncology and Hematology [facility] [address] [place], [place] [address] Tel. [phone]    Fax. [phone]  *Total Encounter Time as  defined by the Centers for Medicare and Medicaid Services includes, in addition to the face-to-face time of a patient visit (documented in the note above) non-face-to-face time: obtaining and reviewing outside history, ordering and reviewing medications, tests or procedures, care coordination (communications with other health care professionals or caregivers) and documentation in the medical record.

## 2024-09-25 ENCOUNTER — Ambulatory Visit: Payer: Self-pay

## 2024-09-25 ENCOUNTER — Other Ambulatory Visit: Payer: Self-pay

## 2024-09-25 ENCOUNTER — Other Ambulatory Visit (HOSPITAL_COMMUNITY): Payer: Self-pay

## 2024-09-25 MED ORDER — MAGNESIUM SULFATE 2 GM/50ML IV SOLN: 2.0000 g | Freq: Once | INTRAVENOUS | Status: DC

## 2024-09-25 MED ORDER — MAGNESIUM SULFATE 2 GM/50ML IV SOLN: 2.0000 g | Freq: Once | INTRAVENOUS | Status: AC

## 2024-09-26 ENCOUNTER — Inpatient Hospital Stay

## 2024-09-26 NOTE — Telephone Encounter (Signed)
 LVM for patient to see if she was coming for her appointment to receive Magnesium .

## 2024-09-28 ENCOUNTER — Telehealth: Payer: Self-pay

## 2024-09-28 ENCOUNTER — Other Ambulatory Visit (HOSPITAL_BASED_OUTPATIENT_CLINIC_OR_DEPARTMENT_OTHER): Payer: Self-pay

## 2024-09-28 NOTE — Telephone Encounter (Signed)
 LVM for patient regarding missed appointment on Saturday to receive magnesium .  Provided callback number and requested patient to return call at earliest convenience to ensure we can get her rescheduled for magnesium  replacement.

## 2024-09-29 ENCOUNTER — Encounter: Payer: Self-pay | Admitting: Neurology

## 2024-09-29 ENCOUNTER — Ambulatory Visit: Admitting: Neurology

## 2024-09-29 VITALS — BP 103/64 | HR 76 | Ht 66.0 in | Wt 191.0 lb

## 2024-09-29 DIAGNOSIS — R2681 Unsteadiness on feet: Secondary | ICD-10-CM | POA: Diagnosis not present

## 2024-09-29 DIAGNOSIS — R413 Other amnesia: Secondary | ICD-10-CM

## 2024-09-29 DIAGNOSIS — R202 Paresthesia of skin: Secondary | ICD-10-CM | POA: Diagnosis not present

## 2024-09-29 DIAGNOSIS — R200 Anesthesia of skin: Secondary | ICD-10-CM

## 2024-09-29 NOTE — Progress Notes (Signed)
 Follow-up Visit   Date: 09/29/24   Shelby Anderson MRN: [id number] DOB: 1956-03-15   Interim History: Shelby Anderson is a 68 y.o. right-handed Caucasian female with asthma, pulmonary hypertension, atrial fibrillation, diabetes mellitus, depression/anxiety, hyperlipidemia, BPPV and left breast cancer (2020) returning to the clinic for with new complaints of memory changes.  IMPRESSION/PLAN: Mild cognitive impairment.  She denies problems with ADLs and IADLs.  She scored 21/30 on MOCA, missing points on visuospatial, attention, language with preserved delayed recall and orientation.  Patient reassured that she does not have signs of dementia.  She does have some cognitive difficulty which may be contributed by poor sleep, mood disorder, as well as medications and the goal is to optimize these conditions.  Vitamin B12 and TSH is normal.   Bilateral arm numbness and gait instability.  Exam shows she may have some neuropathy in the legs.  No myelopathy findings on her exam.  No parkinsonian features.   - Check NCS/EMG of the arm and leg - Fall precautions discussed   -------------------------------------------------------------- UPDATE 09/29/2024: Over the past year, she has been having more difficulty with memory and states that she walks into a room and does not recall what she needed.  She forgets tasks and may misplaces things frequently.   She does not drive.  She manages her own medication and finances and denies any problems.  She is able to keep up with her household chores and ADLs.    She takes oxycodone  for chronic low back pain and is followed by pain management.   She also complains of numbness/tingling and weakness of both arms. She also has imbalance and tends to fall backwards frequently.   She does no smoke.  Rare alcohol use.  She lives at home with husband and two children she has custody of.     Medications:  Current Outpatient Medications on File Prior  to Visit  Medication Sig Dispense Refill   albuterol  (PROVENTIL ) (2.5 MG/3ML) 0.083% nebulizer solution Inhale 3 mLs (2.5 mg total) by nebulization every 6 (six) hours as needed for wheezing or shortness of breath (J45.40). 75 mL 5   albuterol  (VENTOLIN  HFA) 108 (90 Base) MCG/ACT inhaler Inhale 1-2 puffs into the lungs every 4 (four) hours as needed for wheezing or shortness of breath. 6.7 g 3   atorvastatin  (LIPITOR ) 80 MG tablet Take 1 tablet (80 mg total) by mouth at bedtime. 30 tablet 4   budesonide -formoterol  (SYMBICORT ) 80-4.5 MCG/ACT inhaler Inhale 2 puffs into the lungs every morning and another 2 puffs 12 hours later. 30.6 g 3   buPROPion  (WELLBUTRIN  XL) 150 MG 24 hr tablet Take 1 tablet (150 mg total) by mouth daily. 30 tablet 3   Calcium  Carbonate-Vitamin D 600-200 MG-UNIT TABS Take 1 tablet by mouth daily.     Cholecalciferol  (VITAMIN D3) 50 MCG (2000 UT) capsule Take 2,000 Units by mouth daily.     CINNAMON PO Take 1,000 mg 2 (two) times daily by mouth.     dapagliflozin  propanediol (FARXIGA ) 10 MG TABS tablet Take 1 tablet (10 mg total) by mouth daily before breakfast. 90 tablet 1   doxepin  (SINEQUAN ) 50 MG capsule Take 1 capsule (50 mg total) by mouth at bedtime. 90 capsule 1   DULoxetine  (CYMBALTA ) 60 MG capsule Take 1 capsule (60 mg total) by mouth daily. 90 capsule 1   famotidine  (PEPCID ) 20 MG tablet Take 1 tablet (20 mg total) by mouth 2 (two) times daily. 180 tablet 3  fluticasone  (FLONASE ) 50 MCG/ACT nasal spray Place 1 spray into both nostrils daily as needed for allergies.     furosemide  (LASIX ) 20 MG tablet Take 1 tablet (20 mg total) by mouth daily as needed for ankle swelling. 180 tablet 1   glipiZIDE  (GLUCOTROL  XL) 5 MG 24 hr tablet Take 1 tablet (5 mg total) by mouth every morning. 90 tablet 1   isosorbide  mononitrate (IMDUR ) 60 MG 24 hr tablet Take 1 tablet (60 mg total) by mouth every morning. 30 tablet 5   levothyroxine  (SYNTHROID ) 75 MCG tablet Take 1 tablet (75  mcg total) by mouth daily before breakfast. 30 tablet 4   lisinopril  (ZESTRIL ) 5 MG tablet Take 1 tablet (5 mg total) by mouth every morning. 30 tablet 3   metFORMIN  (GLUCOPHAGE ) 500 MG tablet Take 1 tablet (500 mg total) by mouth 2 (two) times daily with a meal. 60 tablet 5   Misc Natural Products (GLUCOS-CHONDROIT-MSM COMPLEX PO) Take 2 tablets by mouth daily.     mometasone  (ELOCON ) 0.1 % cream Apply 1 Application topically daily as needed (for redness/raw). Apply topically behind ears     montelukast  (SINGULAIR ) 10 MG tablet Take 1 tablet (10 mg total) by mouth at bedtime. Need appointment for refills. 30 tablet 0   Multiple Vitamin (MULITIVITAMIN WITH MINERALS) TABS Take 1 tablet by mouth daily.     NEXIUM  40 MG capsule Take 1 capsule (40 mg total) by mouth in the morning and at bedtime. 180 capsule 3   nitroGLYCERIN  (NITROSTAT ) 0.4 MG SL tablet Place 1 tablet under the tongue every 5 minutes as needed for chest pain up to 3 times. If no relief, call 911. 25 tablet 4   NURTEC 75 MG TBDP Take 1 each by mouth daily as needed. 2 tablet 0   ondansetron  (ZOFRAN -ODT) 4 MG disintegrating tablet Dissolve 1 tablet (4 mg total) by mouth 3 (three) times daily as needed for nausea or vomiting. 90 tablet 6   oxyCODONE  (ROXICODONE ) 15 MG immediate release tablet Take 0.5 tablets (7.5 mg total) by mouth every 12 (twelve) hours as needed for pain.     potassium chloride  (KLOR-CON ) 10 MEQ tablet Take 2 tablets (20 mEq total) by mouth every morning. 180 tablet 1   promethazine  (PHENERGAN ) 25 MG tablet Take 1 tablet (25 mg total) by mouth at bedtime. 90 tablet 1   senna-docusate (SENOKOT-S) 8.6-50 MG tablet Take 1 tablet by mouth 2 (two) times daily.     vitamin B-12 (CYANOCOBALAMIN ) 500 MCG tablet Take 500 mcg by mouth daily.      vitamin E 200 UNIT capsule Take 200 Units by mouth daily.     Current Facility-Administered Medications on File Prior to Visit  Medication Dose Route Frequency Provider Last Rate  Last Admin   magnesium  sulfate IVPB 2 g 50 mL  2 g Intravenous Once Causey, Lindsey Cornetto, NP        Allergies:  Allergies  Allergen Reactions   Lodine [Etodolac] Anaphylaxis, Hives and Swelling   Oxycontin  [Oxycodone  Hcl] Anaphylaxis    hives, trouble breathing, tongue swelling (Only Oxycontin ) Tolerates plain oxycodone .   Penicillins Anaphylaxis    Told by a surgeon never to take it again.   Aspirin  Other (See Comments)    High-dose caused GI Bleeds   Darvocet [Propoxyphene N-Acetaminophen ] Hives   Nitroglycerin  Other (See Comments)    IV-BP drops dramatically Can take po   Propoxyphene Hives   Tramadol Hives and Itching   Valium Other (See Comments)  Circulation problems. Legs turned black.    Vital Signs:  BP 103/64   Pulse 76   Ht 5' 6 (1.676 m)   Wt 191 lb (86.6 kg)   SpO2 92%   BMI 30.83 kg/m   Neurological Exam: MENTAL STATUS including orientation to time, place, person, recent and remote memory, attention span and concentration, language, and fund of knowledge is normal.  Speech is not dysarthric.     09/29/2024    1:00 PM  Montreal Cognitive Assessment   Visuospatial/ Executive (0/5) 1  Naming (0/3) 3  Attention: Read list of digits (0/2) 2  Attention: Read list of letters (0/1) 1  Attention: Serial 7 subtraction starting at 100 (0/3) 0  Language: Repeat phrase (0/2) 1  Language : Fluency (0/1) 1  Abstraction (0/2) 1  Delayed Recall (0/5) 5  Orientation (0/6) 6  Total 21   CRANIAL NERVES:  No visual field defects.  Pupils equal round and reactive to light.  Normal conjugate, extra-ocular eye movements in all directions of gaze.  No ptosis.  No nystagmus. Face is symmetric. Palate elevates symmetrically.  Tongue is midline.  Very poor dentition.  MOTOR:  Motor strength is 5/5 in all extremities.  No atrophy, fasciculations or abnormal movements.  No pronator drift.  Tone is normal.    MSRs:  Reflexes are 2+/4 throughout, except mildly reduced  at the ankles.  SENSORY:  vibration is mildly reduced at the toes, otherwise intact. There is sway with rhomberg testing.  COORDINATION/GAIT:  Normal finger-to- nose-finger.  Intact rapid alternating movements bilaterally.  Gait appears stable, unassisted.  Tandem gait intact.  Data: MRI cervical spine 03/20/2021: 1. 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 Finding could serve as a source of patient's symptoms. 2. Left paracentral disc osteophyte complex at C5-6 with resultant mild canal and moderate left C6 foraminal stenosis. 3. Moderate left-sided facet hypertrophy at C3-4 with resultant mild left C4 foraminal stenosis. 4. 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  MRI brain wo contrast 02/04/2020: 1. No acute intracranial abnormality or evidence of metastatic disease. 2. Progressive, moderate chronic small vessel ischemic disease. 3. 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  MRI brain 08/03/2024: 1. No acute intracranial abnormality. 2. Moderately advanced chronic signal changes in the cerebral white matter and basal ganglia with progression since 2021. These are nonspecific but most commonly due to small vessel disease.     Lab Results  Component Value Date   VITAMINB12 1,187 (H) 08/06/2024   Lab Results  Component Value Date   TSH 1.722 08/05/2024   Total time spent reviewing records, interview, history/exam, documentation, and coordination of care on day of encounter:  40 minutes     Thank you for allowing me to participate in patient's care.  If I can answer any additional questions, I would be pleased to do so.    Sincerely,    Loree Shehata K. Tobie, DO

## 2024-09-29 NOTE — Patient Instructions (Signed)
Nerve testing of the right arm and leg  ELECTROMYOGRAM AND NERVE CONDUCTION STUDIES (EMG/NCS) INSTRUCTIONS  How to Prepare The neurologist conducting the EMG will need to know if you have certain medical conditions. Tell the neurologist and other EMG lab personnel if you: Have a pacemaker or any other electrical medical device Take blood-thinning medications Have hemophilia, a blood-clotting disorder that causes prolonged bleeding Bathing Take a shower or bath shortly before your exam in order to remove oils from your skin. Don't apply lotions or creams before the exam.  What to Expect You'll likely be asked to change into a hospital gown for the procedure and lie down on an examination table. The following explanations can help you understand what will happen during the exam.  Electrodes. The neurologist or a technician places surface electrodes at various locations on your skin depending on where you're experiencing symptoms. Or the neurologist may insert needle electrodes at different sites depending on your symptoms.  Sensations. The electrodes will at times transmit a tiny electrical current that you may feel as a twinge or spasm. The needle electrode may cause discomfort or pain that usually ends shortly after the needle is removed. If you are concerned about discomfort or pain, you may want to talk to the neurologist about taking a short break during the exam.  Instructions. During the needle EMG, the neurologist will assess whether there is any spontaneous electrical activity when the muscle is at rest - activity that isn't present in healthy muscle tissue - and the degree of activity when you slightly contract the muscle.  He or she will give you instructions on resting and contracting a muscle at appropriate times. Depending on what muscles and nerves the neurologist is examining, he or she may ask you to change positions during the exam.  After your EMG You may experience some  temporary, minor bruising where the needle electrode was inserted into your muscle. This bruising should fade within several days. If it persists, contact your primary care doctor.

## 2024-09-30 ENCOUNTER — Ambulatory Visit (HOSPITAL_COMMUNITY)
Admission: RE | Admit: 2024-09-30 | Discharge: 2024-09-30 | Disposition: A | Discharge status: Home / Self Care | Source: Ambulatory Visit | Attending: Cardiology | Admitting: Cardiology

## 2024-09-30 ENCOUNTER — Telehealth: Payer: Self-pay

## 2024-09-30 DIAGNOSIS — R600 Localized edema: Secondary | ICD-10-CM | POA: Diagnosis not present

## 2024-09-30 LAB — ECHOCARDIOGRAM COMPLETE
Area-P 1/2: 3.17 cm2
S' Lateral: 2.9 cm

## 2024-09-30 MED ORDER — PERFLUTREN LIPID MICROSPHERE
1.0000 mL | INTRAVENOUS | Status: AC | PRN
  Administered 2024-09-30: 2 mL via INTRAVENOUS

## 2024-09-30 NOTE — Telephone Encounter (Signed)
 Pt  has been approved for AZ&MEFarxiga for 2026.

## 2024-09-30 NOTE — Telephone Encounter (Signed)
 Gave pt a call pt is coming up due for re-enrollment on AZ&ME Farxiga  tried calling pt 3x pt keep hanging up .

## 2024-10-02 ENCOUNTER — Ambulatory Visit: Attending: Cardiology

## 2024-10-02 ENCOUNTER — Encounter (HOSPITAL_BASED_OUTPATIENT_CLINIC_OR_DEPARTMENT_OTHER): Payer: Self-pay | Admitting: Pulmonary Disease

## 2024-10-02 ENCOUNTER — Ambulatory Visit (HOSPITAL_BASED_OUTPATIENT_CLINIC_OR_DEPARTMENT_OTHER): Admitting: Pulmonary Disease

## 2024-10-02 VITALS — BP 122/74 | HR 70 | Ht 66.0 in | Wt 192.0 lb

## 2024-10-02 DIAGNOSIS — G4733 Obstructive sleep apnea (adult) (pediatric): Secondary | ICD-10-CM

## 2024-10-02 DIAGNOSIS — J45998 Other asthma: Secondary | ICD-10-CM | POA: Diagnosis not present

## 2024-10-02 DIAGNOSIS — K219 Gastro-esophageal reflux disease without esophagitis: Secondary | ICD-10-CM

## 2024-10-02 DIAGNOSIS — Z23 Encounter for immunization: Secondary | ICD-10-CM

## 2024-10-02 DIAGNOSIS — R001 Bradycardia, unspecified: Secondary | ICD-10-CM

## 2024-10-02 DIAGNOSIS — R55 Syncope and collapse: Secondary | ICD-10-CM

## 2024-10-02 NOTE — Patient Instructions (Addendum)
 X flu shot   VISIT SUMMARY: Today, you came in for a follow-up visit to discuss your ongoing gastrointestinal symptoms and other health concerns. We reviewed your current medications and discussed your symptoms, including your significant weight loss and how it affects your sleep apnea and asthma.  YOUR PLAN: -OBSTRUCTIVE SLEEP APNEA: Obstructive sleep apnea is a condition where your breathing stops and starts during sleep. Your significant weight loss may reduce the need for CPAP therapy. We will proceed with home sleep testing to reassess your need for CPAP therapy.  -ASTHMA: Asthma is a condition where your airways narrow and swell, making it difficult to breathe. Your asthma is well-controlled with Symbicort , and you have not needed to use your Albuterol  inhaler in about two years. Continue using Symbicort  and use Albuterol  as needed.   -GASTROESOPHAGEAL REFLUX DISEASE (GERD): GERD is a condition where stomach acid frequently flows back into the tube connecting your mouth and stomach. This backwash can irritate the lining of your esophagus. Your GERD is managed with Nexium  and Pepcid . Continue taking these medications and use multiple pillows to sleep upright to help manage your symptoms.  INSTRUCTIONS: Proceed with home sleep testing to reassess the need for CPAP therapy. Await results of upcoming gastrointestinal tests scheduled for early November.                      Contains text generated by [employer].                                 Contains text generated by [employer].

## 2024-10-02 NOTE — Progress Notes (Signed)
 Subjective:    Patient ID: Shelby Anderson, female    DOB: [birth year], 68 y.o.   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  History of Present Illness Shelby DUNSTAN is a 68 year old female with recurrent gastrointestinal symptoms who presents for follow-up and evaluation.  She has not used her albuterol  inhaler in about two years and continues to use Symbicort . She has obstructive sleep apnea and uses a CPAP machine but avoids it during episodes of nausea due to aspiration risk. She sleeps upright with multiple pillows to manage reflux and shortness of breath. Her gastrointestinal symptoms include projectile vomiting and significant weight loss from 232 pounds to 193 pounds. She is currently taking Nexium  and Pepcid  for these symptoms, along with metformin  and pain medications, including Nurtec.  Recurrent severe dehydration due to chronic vomiting has led to multiple hospitalizations since August. Despite extensive testing, the cause is undiagnosed. Awaiting further GI evaluation in early November.   CPAP DL >> sporadic compliance, events controlled on 10 cm  Significant tests/ events reviewed   CT chest 10/2019: Scattered groundglass pulmonary opacities throughout both lungs with subsegmental atelectasis negative for PE CT angiogram 10/29  Neg , GGO 10/29 echocardiogram:.  LVEF 60 to 65% no left  ventricular hypertrophy, RV size was mildly enlarged   Spirometry 12/2011 - no airway obstruction -No reversibility with bronchodilator , mod restriction Spirometry 07/2018 FVC 1.64 (46%), FEV1 1.34 (49%), ratio 82 (104%)    07/2018 FENO 11   11/2015 ONO >> Started on oxygen  2l/m .   10/2016 CT angio neg PE   CT sinus 01/2017 >> clear   EGD 03/2011 ([facility]) - peptic ulcer disease.  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 Also denies presyncope, palpitations, heartburn, abdominal pain, nausea, vomiting, diarrhea or change in bowel or urinary habits, dysuria,hematuria, rash, arthralgias, visual complaints, headache, numbness weakness or ataxia.      Objective:   Physical Exam  Gen. Pleasant, obese, in no distress ENT - no lesions, no post nasal drip Neck: No JVD, no thyromegaly, no carotid bruits Lungs: no use of accessory muscles, no dullness to percussion, decreased without rales or rhonchi  Cardiovascular: Rhythm regular, heart sounds  normal, no murmurs or gallops, no peripheral edema Musculoskeletal: No deformities, no cyanosis or clubbing , no tremors       Assessment & Plan:   Assessment and Plan Assessment & Plan Obstructive sleep apnea  in the context of significant weight loss OSA is managed with CPAP therapy. Significant weight loss (approximately 23 pounds) may negate the need for CPAP. She reports no choking or gasping without CPAP but uses multiple pillows to sleep  upright due to reflux. - Proceed with home sleep testing to reassess the need for CPAP therapy.  Asthma Asthma is well-controlled on Symbicort . She has not used Albuterol  in about two years, indicating good control. The fall season has not exacerbated symptoms this year. - Continue Symbicort . - Use Albuterol  as needed.   Gastroesophageal reflux disease GERD is managed with Nexium  and Pepcid . Increased reflux occurs when lying down, necessitating the use of multiple pillows to sleep upright, which also aids sleep quality. - Continue Nexium  and Pepcid .

## 2024-10-05 ENCOUNTER — Encounter (HOSPITAL_COMMUNITY)
Admission: RE | Admit: 2024-10-05 | Discharge: 2024-10-05 | Disposition: A | Discharge status: Home / Self Care | Source: Ambulatory Visit | Attending: Internal Medicine | Admitting: Internal Medicine

## 2024-10-05 ENCOUNTER — Telehealth: Payer: Self-pay

## 2024-10-05 DIAGNOSIS — R112 Nausea with vomiting, unspecified: Secondary | ICD-10-CM | POA: Insufficient documentation

## 2024-10-05 DIAGNOSIS — E119 Type 2 diabetes mellitus without complications: Secondary | ICD-10-CM

## 2024-10-05 DIAGNOSIS — K219 Gastro-esophageal reflux disease without esophagitis: Secondary | ICD-10-CM | POA: Insufficient documentation

## 2024-10-05 MED ORDER — TECHNETIUM TC 99M SULFUR COLLOID
2.0000 | Freq: Once | INTRAVENOUS | Status: AC
Start: 2024-10-05 — End: 2024-10-05
  Administered 2024-10-05: 2.06 via INTRAVENOUS

## 2024-10-05 MED ORDER — DAPAGLIFLOZIN PROPANEDIOL 10 MG PO TABS: 10.0000 mg | ORAL_TABLET | Freq: Every day | ORAL | 1 refills | Status: AC

## 2024-10-05 NOTE — Progress Notes (Signed)
   10/05/2024  Patient ID: Shelby Anderson Blush, female   DOB: [birth year], 68 y.o.   MRN: [id number]  Received request from patient's AZ&Me PAP program for new rx for Farxiga  10mg .  Sending in refill order.  Jon VEAR Lindau, PharmD Clinical Pharmacist [phone]

## 2024-10-06 ENCOUNTER — Ambulatory Visit: Payer: Self-pay | Admitting: Internal Medicine

## 2024-10-06 DIAGNOSIS — R001 Bradycardia, unspecified: Secondary | ICD-10-CM | POA: Diagnosis not present

## 2024-10-06 DIAGNOSIS — R55 Syncope and collapse: Secondary | ICD-10-CM

## 2024-10-06 NOTE — Progress Notes (Unsigned)
 Cardiology Office Note    Date:  10/07/2024  ID:  Shelby Anderson, DOB July 03, 1956, MRN [id number] PCP:  Ozell Heron HERO, MD  Cardiologist:  Gordy Bergamo, MD  Electrophysiologist:  None   Chief Complaint: Follow-up for syncope  History of Present Illness: .    Shelby Anderson is a 68 y.o. female with visit-pertinent history of NSTEMI with normal coronary arteries, diabetes mellitus, orthostatic hypotension, chronic chest pain syndrome, recurrent syncope s/p loop recorder implantation by [facility] cardiology but no obvious etiology found, severe back pain, bronchial asthma and severe restrictive lung disease, severe GERD, distal esophageal stricture s/p dilation, peptic ulcer disease, depression.   Patient was recently seen on 06/19/2024 by Dr. Bergamo with persistent leg swelling.  Patient reported leg swelling despite taking double doses of Lasix .  She had noted that swelling was particularly severe a few days prior described as 4+ edema.  It was noted that her potassium level had been consistently low requiring daily supplementation.  Patient reported occasional chest pain which she described as normal with no significant changes.  Patient was started on spironolactone  25 mg daily, recommended to follow-up as needed.   On chart review patient was admitted on 08/03/2024 with altered mental status, vomiting.  It was felt that patient had altered mental status related to encephalopathy given combination of pain medications and anxiety/depression medications.  With adjustments to medications patient returned to baseline.  Brain imaging without acute intracranial abnormality, noted to have some evidence of advanced chronic signal changes and cerebral white matter atrophy.  Following up with her PCP/neurology regarding this.  Patient was last seen in clinic on 08/25/2024 regarding medication changes made during her hospitalization.  She reported that her blood pressure was significantly low but  hospitalized resulting in all of her blood pressure medications being discontinued.  She reported that her metoprolol  was discontinued as she had atrial fibrillation with slow ventricular response however on chart review there is no indication that she was in atrial fibrillation during her hospitalization.  She noted significant frustration as she felt that she was just as sick as she was when she initially presented the hospital.  She reported ongoing nausea and vomiting, reported that she been unable to keep food down since she left the hospital with multiple episodes of vomiting during the day as well as abdominal pain.  At office visit she reported at least 3 syncopal episodes since her hospital discharge, was unable to provide much detail surrounding episodes.  ED evaluation was discussed with patient given frequent syncopal episodes and inability to keep food down likely resulting in recurrent dehydration, patient reported she presented the ED for further evaluation.  30-day cardiac monitor and echo were ordered for further evaluation.  Echocardiogram on 09/30/2024 indicated LVEF 60 to 65%, no RWMA, mild LVH, RV systolic function was normal, RV mildly enlarged, noted to have dilation of the RV mid wall and RVOT, no evidence of mitral valve regurgitation or stenosis, mild calcification of the aortic valve with no evidence of stenosis or regurgitation.  Patient was monitored on cardiac monitor for 18 days and 12 hours.  Patient's predominant rhythm was normal sinus rhythm, she had 1 run of VT with fastest run of 152 bpm lasting 8 beats, PAC burden 1%, PVC burden 1%, there is no atrial fibrillation, no high degree heart block, no syncope reported.  2 patient triggered events for dyspnea, dizziness and chest pain which indicated normal sinus rhythm.  Today she presents for follow-up.  She reports that she has been doing well, she denies any further episodes of presyncope or syncope.  She reports that her  chest discomfort is stable and not significantly changed, notes history of chronic pain syndrome.  She notes occasional increased lower extremity edema however reports has been overall controlled in the last month, she has not required any oral Lasix .  She reports that she has not been taking any of her antihypertensive medications since her hospital discharge, will remove lisinopril  and Imdur  from her medication list that she is no longer taking and tolerating well.  Discussed with patient that oncology team has been trying to reach her regarding the magnesium  infusion, she will call following appointment to schedule given hypomagnesemia.  Patient is also now following with GI with improvement in her nausea and vomiting.  Labwork independently reviewed: 09/24/2024: Sodium 144, potassium 3.7, creatinine 0.96, magnesium  1.1, AST 28, ALT 24 ROS: .   Today she denies shortness of breath, fatigue, palpitations, melena, hematuria, hemoptysis, diaphoresis, weakness, presyncope, syncope, orthopnea, and PND.  All other systems are reviewed and otherwise negative. Studies Reviewed: SABRA   EKG:  EKG is not ordered today.  CV Studies: Cardiac studies reviewed are outlined and summarized above. Otherwise please see EMR for full report. Cardiac Studies & Procedures   ______________________________________________________________________________________________   STRESS TESTS  PCV MYOCARDIAL PERFUSION WO LEXISCAN  09/27/2021  Interpretation Summary Exercise nuclear stress test 09/27/2021: Normal ECG stress. The patient exercised for 7 minutes and 13 seconds of a Bruce protocol, achieving approximately 5.91 METs. Normal BP response. Reduced exercise tolerance. Breast tissue attenuation noted in the inferior wall.  Normal myocardial perfusion without ischemi aor scar. Gated SPECT imaging of the left ventricle was normal. All segments of left ventricle demonstrated normal wall motion and thickening. Normal left  ventricle.  . No stress lung uptake. TID is normal. Stress LV EF is normal 58%. No previous exam available for comparison. Low risk study.   ECHOCARDIOGRAM  ECHOCARDIOGRAM COMPLETE 09/30/2024  Narrative ECHOCARDIOGRAM REPORT    Patient Name:   KAMEAH RAWL Date of Exam: 09/30/2024 Medical Rec #:  [id number]            Height:       66.0 in Accession #:    [id number]           Weight:       191.0 lb Date of Birth:  17-Aug-1956            BSA:          1.961 m Patient Age:    68 years             BP:           103/64 mmHg Patient Gender: F                    HR:           53 bpm. Exam Location:  [address]  Procedure: 2D Echo, Cardiac Doppler, Color Doppler and Intracardiac Opacification Agent (Both Spectral and Color Flow Doppler were utilized during procedure).  Indications:    LE Edema R60.0  History:        Patient has prior history of Echocardiogram examinations, most recent 09/27/2021. CHF, CAD, Arrythmias:Atrial Fibrillation; Risk Factors:Hypertension, Diabetes and Dyslipidemia.  Sonographer:    Augustin Seals RDCS Referring Phys: [id number] Myleigh Amara D Cordero Surette  IMPRESSIONS   1. Left ventricular ejection fraction, by estimation, is 60 to 65%. The left ventricle has normal  function. The left ventricle has no regional wall motion abnormalities. There is mild left ventricular hypertrophy. Left ventricular diastolic parameters are indeterminate. 2. Dilation of the RV mid wall and RVOT. Right ventricular systolic function is normal. The right ventricular size is mildly enlarged. 3. The mitral valve is grossly normal. No evidence of mitral valve regurgitation. No evidence of mitral stenosis. 4. The aortic valve is tricuspid. There is mild calcification of the aortic valve. There is mild thickening of the aortic valve. Aortic valve regurgitation is not visualized. No aortic stenosis is present.  Comparison(s): No significant change from prior study.  FINDINGS Left  Ventricle: Left ventricular ejection fraction, by estimation, is 60 to 65%. The left ventricle has normal function. The left ventricle has no regional wall motion abnormalities. Definity contrast agent was given IV to delineate the left ventricular endocardial borders. The left ventricular internal cavity size was normal in size. There is mild left ventricular hypertrophy. Left ventricular diastolic parameters are indeterminate.  Right Ventricle: Dilation of the RV mid wall and RVOT. The right ventricular size is mildly enlarged. No increase in right ventricular wall thickness. Right ventricular systolic function is normal.  Left Atrium: Left atrial size was normal in size.  Right Atrium: Right atrial size was normal in size.  Pericardium: There is no evidence of pericardial effusion.  Mitral Valve: The mitral valve is grossly normal. Mild to moderate mitral annular calcification. No evidence of mitral valve regurgitation. No evidence of mitral valve stenosis.  Tricuspid Valve: The tricuspid valve is normal in structure. Tricuspid valve regurgitation is mild . No evidence of tricuspid stenosis.  Aortic Valve: The aortic valve is tricuspid. There is mild calcification of the aortic valve. There is mild thickening of the aortic valve. There is mild aortic valve annular calcification. Aortic valve regurgitation is not visualized. No aortic stenosis is present.  Pulmonic Valve: The pulmonic valve was not well visualized. Pulmonic valve regurgitation is not visualized. No evidence of pulmonic stenosis.  Aorta: The aortic root and ascending aorta are structurally normal, with no evidence of dilitation.  IAS/Shunts: No atrial level shunt detected by color flow Doppler.   LEFT VENTRICLE PLAX 2D LVIDd:         4.70 cm   Diastology LVIDs:         2.90 cm   LV e' medial:    9.57 cm/s LV PW:         1.20 cm   LV E/e' medial:  6.1 LV IVS:        1.10 cm   LV e' lateral:   11.30 cm/s LVOT diam:      2.30 cm   LV E/e' lateral: 5.1 LV SV:         89 LV SV Index:   46 LVOT Area:     4.15 cm   RIGHT VENTRICLE            IVC RV Basal diam:  3.50 cm    IVC diam: 1.80 cm RV Mid diam:    3.80 cm RV S prime:     9.68 cm/s TAPSE (M-mode): 1.9 cm  LEFT ATRIUM             Index        RIGHT ATRIUM           Index LA diam:        3.30 cm 1.68 cm/m   RA Area:     15.10 cm LA Vol (A2C):  48.0 ml 24.48 ml/m  RA Volume:   40.60 ml  20.70 ml/m LA Vol (A4C):   28.5 ml 14.53 ml/m LA Biplane Vol: 40.3 ml 20.55 ml/m AORTIC VALVE LVOT Vmax:   102.00 cm/s LVOT Vmean:  62.700 cm/s LVOT VTI:    0.215 m  AORTA Ao Root diam: 3.20 cm Ao Asc diam:  3.40 cm  MITRAL VALVE MV Area (PHT): 3.17 cm    SHUNTS MV Decel Time: 239 msec    Systemic VTI:  0.22 m MV E velocity: 58.10 cm/s  Systemic Diam: 2.30 cm MV A velocity: 68.60 cm/s MV E/A ratio:  0.85  Stanly Leavens MD Electronically signed by Stanly Leavens MD Signature Date/Time: 09/30/2024/5:01:51 PM    Final    MONITORS  CARDIAC EVENT MONITOR 10/02/2024  Narrative Images from the original result were not included. Event monitor for 19 days starting 09/07/2024 for syncope: Patient monitored for 18d 12h 34m 14 events were transmitted. 2 patient triggered; 12 auto triggered VT occurred 1 time( s) with fastest run 152 BPM 10, 214 PACs with PAC burden of 1% 11, 289 PVCs with PVC burden of 1% There was not atrial fibrillation. No high degree heart block. No syncope reported. Two patient triggered events for dyspnea, dizziness and chest pain: NSR.  NSVT 09/24/24 at 14:37 hours. Asymptomatic   Patient triggered event:       ______________________________________________________________________________________________       Current Reported Medications:.    Current Meds  Medication Sig   albuterol  (PROVENTIL ) (2.5 MG/3ML) 0.083% nebulizer solution Inhale 3 mLs (2.5 mg total) by nebulization every 6 (six) hours as  needed for wheezing or shortness of breath (J45.40).   albuterol  (VENTOLIN  HFA) 108 (90 Base) MCG/ACT inhaler Inhale 1-2 puffs into the lungs every 4 (four) hours as needed for wheezing or shortness of breath.   atorvastatin  (LIPITOR ) 80 MG tablet Take 1 tablet (80 mg total) by mouth at bedtime.   budesonide -formoterol  (SYMBICORT ) 80-4.5 MCG/ACT inhaler Inhale 2 puffs into the lungs every morning and another 2 puffs 12 hours later.   buPROPion  (WELLBUTRIN  XL) 150 MG 24 hr tablet Take 1 tablet (150 mg total) by mouth daily.   Calcium  Carbonate-Vitamin D 600-200 MG-UNIT TABS Take 1 tablet by mouth daily.   Cholecalciferol  (VITAMIN D3) 50 MCG (2000 UT) capsule Take 2,000 Units by mouth daily.   CINNAMON PO Take 1,000 mg 2 (two) times daily by mouth.   dapagliflozin  propanediol (FARXIGA ) 10 MG TABS tablet Take 1 tablet (10 mg total) by mouth daily before breakfast.   doxepin  (SINEQUAN ) 50 MG capsule Take 1 capsule (50 mg total) by mouth at bedtime.   DULoxetine  (CYMBALTA ) 60 MG capsule Take 1 capsule (60 mg total) by mouth daily.   famotidine  (PEPCID ) 20 MG tablet Take 1 tablet (20 mg total) by mouth 2 (two) times daily.   fluticasone  (FLONASE ) 50 MCG/ACT nasal spray Place 1 spray into both nostrils daily as needed for allergies.   furosemide  (LASIX ) 20 MG tablet Take 1 tablet (20 mg total) by mouth daily as needed for ankle swelling.   glipiZIDE  (GLUCOTROL  XL) 5 MG 24 hr tablet Take 1 tablet (5 mg total) by mouth every morning.   levothyroxine  (SYNTHROID ) 75 MCG tablet Take 1 tablet (75 mcg total) by mouth daily before breakfast.   metFORMIN  (GLUCOPHAGE ) 500 MG tablet Take 1 tablet (500 mg total) by mouth 2 (two) times daily with a meal.   Misc Natural Products (GLUCOS-CHONDROIT-MSM COMPLEX PO) Take 2 tablets by  mouth daily.   mometasone  (ELOCON ) 0.1 % cream Apply 1 Application topically daily as needed (for redness/raw). Apply topically behind ears   montelukast  (SINGULAIR ) 10 MG tablet Take 1  tablet (10 mg total) by mouth at bedtime. Need appointment for refills.   Multiple Vitamin (MULITIVITAMIN WITH MINERALS) TABS Take 1 tablet by mouth daily.   NEXIUM  40 MG capsule Take 1 capsule (40 mg total) by mouth in the morning and at bedtime.   nitroGLYCERIN  (NITROSTAT ) 0.4 MG SL tablet Place 1 tablet under the tongue every 5 minutes as needed for chest pain up to 3 times. If no relief, call 911.   NURTEC 75 MG TBDP Take 1 each by mouth daily as needed.   ondansetron  (ZOFRAN -ODT) 4 MG disintegrating tablet Dissolve 1 tablet (4 mg total) by mouth 3 (three) times daily as needed for nausea or vomiting.   oxyCODONE  (ROXICODONE ) 15 MG immediate release tablet Take 0.5 tablets (7.5 mg total) by mouth every 12 (twelve) hours as needed for pain.   potassium chloride  (KLOR-CON ) 10 MEQ tablet Take 2 tablets (20 mEq total) by mouth every morning.   promethazine  (PHENERGAN ) 25 MG tablet Take 1 tablet (25 mg total) by mouth at bedtime.   senna-docusate (SENOKOT-S) 8.6-50 MG tablet Take 1 tablet by mouth 2 (two) times daily.   vitamin B-12 (CYANOCOBALAMIN ) 500 MCG tablet Take 500 mcg by mouth daily.    vitamin E 200 UNIT capsule Take 200 Units by mouth daily.   [DISCONTINUED] isosorbide  mononitrate (IMDUR ) 60 MG 24 hr tablet Take 1 tablet (60 mg total) by mouth every morning.   [DISCONTINUED] lisinopril  (ZESTRIL ) 5 MG tablet Take 1 tablet (5 mg total) by mouth every morning.   Physical Exam:    VS:  BP 106/64   Pulse 92   Ht 5' 6 (1.676 m)   Wt 195 lb 6.4 oz (88.6 kg)   SpO2 93%   BMI 31.54 kg/m    Wt Readings from Last 3 Encounters:  10/07/24 195 lb 6.4 oz (88.6 kg)  10/02/24 192 lb (87.1 kg)  09/29/24 191 lb (86.6 kg)    GEN: Well nourished, well developed in no acute distress NECK: No JVD; No carotid bruits CARDIAC: RRR, no murmurs, rubs, gallops RESPIRATORY:  Clear to auscultation without rales, wheezing or rhonchi  ABDOMEN: Soft, non-tender, non-distended EXTREMITIES:  No edema; No  acute deformity     Asessement and Plan:.    Nausea/vomiting/syncope: At last office visit patient reported she had had multiple episodes of the last year regarding problems with increased nausea vomiting resulting in significant dehydration with increased presyncope.  She reported she continued problems with ongoing nausea and vomiting and reported multiple syncopal episodes.  It was recommended that patient receive ED evaluation as she was likely significantly hydrated, on chart review.  She presented to the ED however left without being evaluated.  Echo on 09/30/2024 indicated LVEF 60 to 65%, no RWMA, mild LVH, RV systolic function was normal, RV mildly enlarged, noted to have dilation of the RV mid wall and RVOT.  Patient cardiac monitor showed predominant rhythm as normal sinus rhythm, 1 run of VT lasting 8 beats at a rate of 152 bpm.  Her 2 triggered events for dyspnea and dizziness showed normal sinus rhythm.  Today she reports she has not had any further episodes of syncope or presyncope, reports that her nausea has significantly improved in recent weeks and she has been increasing her hydration.  Patient is aware of driving restriction  following syncopal episodes.  Patient agreeable to cardiac MRI as noted below given RV mid wall dilation and RVOT dilation.  Hypertension: Blood pressure today 106/64, patient reports that her blood pressure has been significantly well-controlled at home, has not taken her Imdur , lisinopril  or metoprolol  since September per patient will discontinue medications at this time.  She denies any further dizziness, lightheadedness, presyncope or syncope.  Encouraged patient to continue monitoring her blood pressure at home and notify the office if consistently elevated 130/80.  Chest pain/RV dilation: Patient noted to have normal coronary arteries, patient with chronic intermittent chest pain with no significant episodes outside of normal occurrences.  Her echocardiogram was  overall reassuring with LVEF 60 to 65%, no RWMA, she did have dilation of the RV mid wall and RVOT, reviewed with Dr. Kate, DOD today who recommended proceeding with cardiac MRI given dilation of the LV mid wall, patient in agreement with plan.  Reviewed ED precautions.  Patient reports that she has remained stable off of Imdur , given low blood pressures will discontinue today.  Hypomagnesemia: Patient magnesium  level 1.1 on 09/24/2024, was to have infusion per oncology however misunderstood scheduled time, will reach out to reschedule and plans to follow-up with her PCP or GI for ongoing monitoring.   Disposition: F/u with Tykia Mellone, NP or Dr. Ladona in six weeks.   Signed, Kinlee Garrison D Serrina Minogue, NP

## 2024-10-07 ENCOUNTER — Ambulatory Visit: Payer: Self-pay | Admitting: Cardiology

## 2024-10-07 ENCOUNTER — Telehealth: Payer: Self-pay

## 2024-10-07 ENCOUNTER — Encounter: Payer: Self-pay | Admitting: Cardiology

## 2024-10-07 ENCOUNTER — Ambulatory Visit: Attending: Cardiology | Admitting: Cardiology

## 2024-10-07 VITALS — BP 106/64 | HR 92 | Ht 66.0 in | Wt 195.4 lb

## 2024-10-07 DIAGNOSIS — I10 Essential (primary) hypertension: Secondary | ICD-10-CM

## 2024-10-07 DIAGNOSIS — R55 Syncope and collapse: Secondary | ICD-10-CM | POA: Diagnosis not present

## 2024-10-07 DIAGNOSIS — R079 Chest pain, unspecified: Secondary | ICD-10-CM

## 2024-10-07 DIAGNOSIS — I517 Cardiomegaly: Secondary | ICD-10-CM

## 2024-10-07 DIAGNOSIS — R600 Localized edema: Secondary | ICD-10-CM

## 2024-10-07 DIAGNOSIS — I951 Orthostatic hypotension: Secondary | ICD-10-CM

## 2024-10-07 NOTE — Telephone Encounter (Signed)
 Patient rescheduled for magnesium  infusion. Patient was a no-show for the previously scheduled appointment due to a misunderstanding of the date/time.

## 2024-10-07 NOTE — Telephone Encounter (Signed)
 Lab appointment scheduled for Friday at 12:30 PM. Patient was contacted and confirmed agreement with the appointment time. Lab order has been entered.

## 2024-10-07 NOTE — Patient Instructions (Signed)
 Medication Instructions:   STOP isosorbide  mononitrate STOP lisinopril    *If you need a refill on your cardiac medications before your next appointment, please call your pharmacy*  Lab Work:  Hemoglobin/Hematocrit -- complete before MRI test  If you have labs (blood work) drawn today and your tests are completely normal, you will receive your results only by: MyChart Message (if you have MyChart) OR A paper copy in the mail If you have any lab test that is abnormal or we need to change your treatment, we will call you to review the results.  Testing/Procedures: Cardiac MRI You will get a call to schedule this once approved with insurance   Follow-Up: At [facility], you and your health needs are our priority.  As part of our continuing mission to provide you with exceptional heart care, our providers are all part of one team.  This team includes your primary Cardiologist (physician) and Advanced Practice Providers or APPs (Physician Assistants and Nurse Practitioners) who all work together to provide you with the care you need, when you need it.  Your next appointment:    6 weeks with Dr. Ganji or Katlyn West NP  We recommend signing up for the patient portal called MyChart.  Sign up information is provided on this After Visit Summary.  MyChart is used to connect with patients for Virtual Visits (Telemedicine).  Patients are able to view lab/test results, encounter notes, upcoming appointments, etc.  Non-urgent messages can be sent to your provider as well.   To learn more about what you can do with MyChart, go to [email or web address].   Other Instructions  CALL oncology regarding magnesium  infusion       You are scheduled for Cardiac MRI at the location below.  Please arrive for your appointment at ______________ . ?  [facility] [address] [place], [place] [address] Please take advantage of the free valet parking available at the [facility] and  [employer] (Entrance C).  Proceed to the [facility] Radiology Department (First Floor) for check-in.   OR   [facility] [address] [place], [place] [address] Please go to the [facility] and check-in with the desk attendant.   Magnetic resonance imaging (MRI) is a painless test that produces images of the inside of the body without using Xrays.  During an MRI, strong magnets and radio waves work together in a data processing manager to form detailed images.   MRI images may provide more details about a medical condition than X-rays, CT scans, and ultrasounds can provide.  You may be given earphones to listen for instructions.  You may eat a light breakfast and take medications as ordered with the exception of furosemide , hydrochlorothiazide , chlorthalidone or spironolactone  (or any other fluid pill). If you are undergoing a stress MRI, please avoid stimulants for 12 hr prior to test. (I.e. Caffeine, nicotine, chocolate, or antihistamine medications)  If your provider has ordered anti-anxiety medications for this test, then you will need a driver.  An IV will be inserted into one of your veins. Contrast material will be injected into your IV. It will leave your body through your urine within a day. You may be told to drink plenty of fluids to help flush the contrast material out of your system.  You will be asked to remove all metal, including: Watch, jewelry, and other metal objects including hearing aids, hair pieces and dentures. Also wearable glucose monitoring systems (ie. [employer] and [employer]) (Braces  and fillings normally are not a problem.)   TEST WILL TAKE APPROXIMATELY 1 HOUR  PLEASE NOTIFY SCHEDULING AT LEAST 24 HOURS IN ADVANCE IF YOU ARE UNABLE TO KEEP YOUR APPOINTMENT. [phone]  For more information and frequently asked questions, please visit our website : [email or web address]  Please call the Cardiac Imaging  Nurse Navigators with any questions/concerns. [phone] Office

## 2024-10-07 NOTE — Telephone Encounter (Signed)
 Can we repeat magnesium  level before her infusion?

## 2024-10-08 ENCOUNTER — Other Ambulatory Visit: Payer: Self-pay

## 2024-10-08 NOTE — Patient Outreach (Unsigned)
 Complex Care Management   Visit Note  10/08/2024  Name:  Shelby Anderson MRN: [id number] DOB: 09/09/1956  Situation: Referral received for Complex Care Management related to Asthma and Diabetes. I obtained verbal consent from Patient.  Visit completed with Mrs.  Anderson via telephone.  Background:   Past Medical History:  Diagnosis Date   Allergy     Anemia    chronic   Angina    Prinzmetal angina   Anxiety and depression    Arthritis    Asthma    Atrial fibrillation (HCC)    h/o AF w/frequent PVCs   Breast cancer (HCC)    Left   Cancer (HCC)    hx of skin cancer    CHF (congestive heart failure) (HCC)    Chronic back pain greater than 3 months duration    on chronic narcotics, treated at pain clinic   Chronic respiratory failure (HCC) 09/15/2015   ONO 09/04/15 + desats >begin O2 at 2l/ m    Colon polyps    hyperplastic   Coronary artery disease    Arrythmia, orthostatic hypotension, HLD, HTN; sees Dr. Ladona   Difficult intubation    TMJ & woke up when they were still cutting on me   Dysrhythmia    sees Dr. 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 asthma   Stenotic cervical os    Stomach ulcer    3 small; found in 05/2011   TMJ (dislocation of temporomandibular joint)    Tuberculosis    + TB SKIN TEST   Type 2 diabetes mellitus without complication, without long-term current use of insulin  (HCC) 12/06/2015   not on meds     Assessment: Patient Reported Symptoms:  Cognitive Cognitive Status: Alert and oriented to person, place, and time, Normal speech and language skills Cognitive/Intellectual Conditions Management [RPT]: None reported or documented in medical history or problem list Health Maintenance Behaviors: Annual physical exam, Healthy diet, Hobbies, Stress management Healing Pattern: Average Health Facilitated by: Rest, Stress management, Healthy diet  Neurological Neurological Review of Symptoms: No symptoms reported Neurological Management Strategies: Routine screening Neurological Self-Management Outcome: 4 (good)  HEENT HEENT Symptoms Reported: No symptoms reported HEENT Management Strategies: Routine screening HEENT Self-Management Outcome: 4 (good)  Cardiovascular Cardiovascular Symptoms Reported: No symptoms reported Does patient have uncontrolled Hypertension?: No Cardiovascular Management  Strategies: Medication therapy, Medical device, Diet modification, Coping strategies, Routine screening, Weight management Do You Have a Working Readable Scale?: Yes Weight: 194 lb (88 kg) Cardiovascular Self-Management Outcome: 4 (good)  Respiratory Respiratory Symptoms Reported: No symptoms reported Respiratory Self-Management Outcome: 5 (very good)  Endocrine Endocrine Symptoms Reported: No symptoms reported Is patient diabetic?: Yes Is patient checking blood sugars at home?: Yes List most recent blood sugar readings, include date and time of day: Reports fasting readings have ranged in the low 100s Endocrine  Self-Management Outcome: 4 (good)  Gastrointestinal Gastrointestinal Symptoms Reported: No symptoms reported Gastrointestinal Self-Management Outcome: 4 (good)  Genitourinary Genitourinary Symptoms Reported: No symptoms reported Genitourinary Self-Management Outcome: 4 (good)  Integumentary Integumentary Symptoms Reported: No symptoms reported Skin Self-Management Outcome: 4 (good)  Musculoskeletal Musculoskelatal Symptoms Reviewed: No symptoms reported Musculoskeletal Management Strategies: Medication therapy, Medical device, Routine screening Musculoskeletal Self-Management Outcome: 4 (good) Falls in the past year?: Yes Number of falls in past year: 2 or more Was there an injury with Fall?: Yes Fall Risk Category Calculator: 3 Patient Fall Risk Level: High Fall Risk Patient at Risk for Falls Due to: Medication side effect Fall risk Follow up: Falls prevention discussed  Psychosocial Psychosocial Symptoms Reported: No symptoms reported Quality of Family Relationships: supportive Do you feel physically threatened by others?: No    10/08/2024    PHQ2-9 Depression Screening   Little interest or pleasure in doing things Not at all  Feeling down, depressed, or hopeless Not at all  PHQ-2 - Total Score 0    Vitals:   10/08/24 1335  BP: 103/68    Medications Reviewed Today     Reviewed by Shelby Lima, RN (Registered Nurse) on 10/08/24 at 1319  Med List Status: <None>   Medication Order Taking? Sig Documenting Provider Last Dose Status Informant  albuterol  (PROVENTIL ) (2.5 MG/3ML) 0.083% nebulizer solution [id number]  Inhale 3 mLs (2.5 mg total) by nebulization every 6 (six) hours as needed for wheezing or shortness of breath (J45.40). Parrett, Shelby RAMAN, NP  Active Self, Pharmacy Records  albuterol  (VENTOLIN  HFA) [phone] Base) MCG/ACT inhaler [id number]  Inhale 1-2 puffs into the lungs every 4 (four) hours as needed for wheezing or shortness of breath. Parrett, Shelby RAMAN, NP  Active  Self, Pharmacy Records  atorvastatin  (LIPITOR ) 80 MG tablet [id number]  Take 1 tablet (80 mg total) by mouth at bedtime. Shelby Heron HERO, MD  Active Self, Pharmacy Records  budesonide -formoterol  (SYMBICORT ) 80-4.5 MCG/ACT inhaler [id number]  Inhale 2 puffs into the lungs every morning and another 2 puffs 12 hours later. Parrett, Shelby RAMAN, NP  Active Self, Pharmacy Records  buPROPion  (WELLBUTRIN  XL) 150 MG 24 hr tablet [id number]  Take 1 tablet (150 mg total) by mouth daily. Shelby Heron HERO, MD  Active   Calcium  Carbonate-Vitamin D 600-200 MG-UNIT TABS [id number]  Take 1 tablet by mouth daily. [provider]  Active Self, Pharmacy Records  Cholecalciferol  (VITAMIN D3) 50 MCG (2000 UT) capsule [id number]  Take 2,000 Units by mouth daily. [provider]  Active Self, Pharmacy Records  CINNAMON PO [id number]  Take 1,000 mg 2 (two) times daily by mouth. [provider]  Active Self, Pharmacy Records  dapagliflozin  propanediol (FARXIGA ) 10 MG TABS tablet [id number]  Take 1 tablet (10 mg total) by mouth daily before breakfast. Shelby Heron HERO, MD  Active   doxepin  (SINEQUAN ) 50 MG capsule [id number]  Take 1 capsule (50 mg total) by mouth at bedtime.   Active   DULoxetine  (CYMBALTA ) 60  MG capsule [id number]  Take 1 capsule (60 mg total) by mouth daily. Shelby Heron HERO, MD  Active   famotidine  (PEPCID ) 20 MG tablet [id number]  Take 1 tablet (20 mg total) by mouth 2 (two) times daily. [place], PA-C  Active Self, Pharmacy Records  fluticasone  (FLONASE ) 50 MCG/ACT nasal spray [id number]  Place 1 spray into both nostrils daily as needed for allergies. [provider]  Active Self, Pharmacy Records  furosemide  (LASIX ) 20 MG tablet [id number]  Take 1 tablet (20 mg total) by mouth daily as needed for ankle swelling. Shelby Heron HERO, MD  Active Self, Pharmacy Records  glipiZIDE  (GLUCOTROL  XL) 5 MG 24 hr tablet [id number]  Take 1 tablet (5 mg total) by mouth every morning.  Shelby Heron HERO, MD  Active Self, Pharmacy Records  levothyroxine  (SYNTHROID ) 75 MCG tablet [id number]  Take 1 tablet (75 mcg total) by mouth daily before breakfast. Shelby Heron HERO, MD  Active Self, Pharmacy Records  magnesium  sulfate IVPB 2 g 50 mL [id number]   Crawford Morna Pickle, NP  Active   metFORMIN  (GLUCOPHAGE ) 500 MG tablet [id number]  Take 1 tablet (500 mg total) by mouth 2 (two) times daily with a meal. Shelby Heron HERO, MD  Active   Misc Natural Products (GLUCOS-CHONDROIT-MSM COMPLEX PO) [id number]  Take 2 tablets by mouth daily. [provider]  Active Self, Pharmacy Records  mometasone  (ELOCON ) 0.1 % cream [id number]  Apply 1 Application topically daily as needed (for redness/raw). Apply topically behind ears [provider]  Active Self, Pharmacy Records  montelukast  (SINGULAIR ) 10 MG tablet [id number]  Take 1 tablet (10 mg total) by mouth at bedtime. Need appointment for refills. Parrett, Shelby RAMAN, NP  Active   Multiple Vitamin (MULITIVITAMIN WITH MINERALS) TABS [id number]  Take 1 tablet by mouth daily. [provider]  Active Self, Pharmacy Records  NEXIUM  40 MG capsule [id number]  Take 1 capsule (40 mg total) by mouth in the morning and at bedtime. [place], PA-C  Active Self, Pharmacy Records  nitroGLYCERIN  (NITROSTAT ) 0.4 MG SL tablet [id number]  Place 1 tablet under the tongue every 5 minutes as needed for chest pain up to 3 times. If no relief, call 911. Shelby Anderson Heinz, MD  Active Self, Pharmacy Records  NURTEC 75 MG TBDP [id number]  Take 1 each by mouth daily as needed. Shelby Heron HERO, MD  Active Self, Pharmacy Records  ondansetron  (ZOFRAN -ODT) 4 MG disintegrating tablet [id number]  Dissolve 1 tablet (4 mg total) by mouth 3 (three) times daily as needed for nausea or vomiting. Abran Norleen SAILOR, MD  Active   oxyCODONE  (ROXICODONE ) 15 MG immediate release tablet [id number]  Take 0.5 tablets (7.5 mg total) by mouth every 12 (twelve) hours as needed for  pain. Elgergawy, Brayton RAMAN, MD  Active            Med Note LORICE, CATHLEAN DEL   Wed Aug 12, 2024  3:56 PM) Need prescription   potassium chloride  (KLOR-CON ) 10 MEQ tablet [id number]  Take 2 tablets (20 mEq total) by mouth every morning. Shelby Heron HERO, MD  Active Self, Pharmacy Records  promethazine  (PHENERGAN ) 25 MG tablet [id number]  Take 1 tablet (25 mg total) by mouth at bedtime. Abran Norleen SAILOR, MD  Active   senna-docusate (SENOKOT-S) 8.6-50 MG tablet [id number]  Take 1 tablet by mouth 2 (two) times daily. Elgergawy, Brayton RAMAN, MD  Active   vitamin B-12 (CYANOCOBALAMIN ) 500 MCG tablet [id number]  Take 500 mcg by mouth daily.  [provider]  Active Self, Pharmacy Records  vitamin E 200 UNIT capsule [id number]  Take 200 Units by mouth daily. [provider]  Active Self, Pharmacy Records            Recommendation:   Continue Current Plan of Care  Follow Up Plan:   Telephone follow up appointment with Nurse Case Manager on October 23, 2024   Jackson Acron [facility] RN Care Manager Direct Dial: [phone]  Fax: [phone] Website: [email or web address]

## 2024-10-09 ENCOUNTER — Inpatient Hospital Stay: Attending: Adult Health

## 2024-10-09 ENCOUNTER — Ambulatory Visit (INDEPENDENT_AMBULATORY_CARE_PROVIDER_SITE_OTHER)

## 2024-10-09 DIAGNOSIS — Z0000 Encounter for general adult medical examination without abnormal findings: Secondary | ICD-10-CM | POA: Diagnosis not present

## 2024-10-09 LAB — MAGNESIUM: Magnesium: 1.5 mg/dL — ABNORMAL LOW (ref 1.7–2.4)

## 2024-10-09 NOTE — Patient Instructions (Addendum)
 Thank you for allowing the Complex Care Management team to participate in your care. It was great speaking with you!  Reminder: -Please attend your PCP appointment as scheduled on October 14, 2024. -I will message your PCP regarding the new order required for Contour glucometer and supplies.  We will follow up on October 23, 2024 at 1 pm. Please do not hesitate to contact me if you require assistance prior to our next outreach.     Jackson Acron [facility] Population [facility] RN Care Manager Direct Dial: [phone]  Fax: [phone] Website: [email or web address]

## 2024-10-09 NOTE — Progress Notes (Cosign Needed Addendum)
 I connected with  Shelby Anderson on 10/09/24 by a audio enabled telemedicine application and verified that I am speaking with the correct person using two identifiers.  Persons participating in visit: patient and Shelby Dawn LPN  Patient Location: Home  Provider Location: Office/Clinic  I discussed the limitations of evaluation and management by telemedicine. The patient expressed understanding and agreed to proceed.   Subjective:   Shelby Anderson is a 69 y.o. female who presents for a Medicare Annual Wellness Visit.  Allergies (verified) Lodine [etodolac], Oxycontin  [oxycodone  hcl], Penicillins, Aspirin , Darvocet [propoxyphene n-acetaminophen ], Nitroglycerin , Propoxyphene, Tramadol, and Valium   History: Past Medical History:  Diagnosis Date   Allergy     Anemia    chronic   Angina    Prinzmetal angina   Anxiety and depression    Arthritis    Asthma    Atrial fibrillation (HCC)    h/o AF w/frequent PVCs   Breast cancer (HCC)    Left   Cancer (HCC)    hx of skin cancer    CHF (congestive heart failure) (HCC)    Chronic back pain greater than 3 months duration    on chronic narcotics, treated at pain clinic   Chronic respiratory failure (HCC) 09/15/2015   ONO 09/04/15 + desats >begin O2 at 2l/ m    Colon polyps    hyperplastic   Coronary artery disease    Arrythmia, orthostatic hypotension, HLD, HTN; sees Dr. Ladona   Difficult intubation    TMJ & woke up when they were still cutting on me   Dysrhythmia    sees Dr. 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 Ladona   OSA (obstructive sleep apnea)    Personal history of chemotherapy    Personal history of radiation therapy    Pneumonia    multiple times   PONV (postoperative nausea and vomiting)    Recurrent upper respiratory infection (URI)    S/P left TKA 09/25/2016   Shortness of breath 11/20/11   all the time, sees pulmonlogy, ? asthma   Stenotic cervical os    Stomach ulcer    3 small; found in 05/2011   TMJ (dislocation of temporomandibular joint)    Tuberculosis    + TB SKIN TEST   Type 2 diabetes mellitus without complication, without long-term current use of insulin  (HCC) 12/06/2015   not on meds    Past Surgical History:  Procedure Laterality Date   ACHILLES TENDON REPAIR  1970's   left ankle   ARTHROSCOPIC REPAIR ACL     left knee cap   BREAST BIOPSY Right 04/08/2013   again in October or November 2020   BREAST LUMPECTOMY Left 12/09/2019   BREAST LUMPECTOMY  WITH RADIOACTIVE SEED AND SENTINEL LYMPH NODE BIOPSY Left 12/09/2019   Procedure: LEFT BREAST LUMPECTOMY WITH RADIOACTIVE SEED AND LEFT AXILLARY SENTINEL LYMPH NODE BIOPSY;  Surgeon: Ebbie Cough, MD;  Location: [facility] OR;  Service: General;  Laterality: Left;  PEC BLOCK   CARDIAC CATHETERIZATION     loop recorder   CARPAL TUNNEL RELEASE  unknown   left hand   COLONOSCOPY     ESOPHAGOGASTRODUODENOSCOPY (EGD) WITH PROPOFOL  N/A 10/29/2017   Procedure: ESOPHAGOGASTRODUODENOSCOPY (EGD) WITH PROPOFOL ;  Surgeon: Abran Norleen SAILOR, MD;  Location: [facility] END[facility]COPY;  Service: Endoscopy;  Laterality: N/A;   LOOP RECORDER IMPLANT     PORT-A-CATH REMOVAL N/A 12/09/2019   Procedure: REMOVAL PORT-A-CATH;  Surgeon: Ebbie Cough, MD;  Location: [facility] OR;  Service: General;  Laterality: N/A;   PORTACATH PLACEMENT N/A 09/23/2019   Procedure:  INSERTION PORT-A-CATH Right Internal Brooke RATER;  Surgeon: Ebbie Cough, MD;  Location: [facility] OR;  Service: General;  Laterality: N/A;   post ganglionectomy  1970's   for migraine headaches   pouch string  [phone]   did this 3 times (once w/each pregnancy)   SAVORY DILATION N/A 10/29/2017   Procedure: SAVORY DILATION;  Surgeon: Abran Norleen SAILOR, MD;  Location: [facility];  Service: Endoscopy;  Laterality: N/A;   TOTAL KNEE ARTHROPLASTY Left 09/25/2016   Procedure: LEFT TOTAL KNEE ARTHROPLASTY;  Surgeon: Cough Car, MD;  Location: [facility] ORS;  Service: Orthopedics;  Laterality: Left;   TUBAL LIGATION  1980's   Family History  Problem Relation Age of Onset   Malignant hyperthermia Father    Hypertension Father    Heart disease Father    Diabetes Father    Cancer Father        skin   Hypertension Mother    Heart disease Mother    Multiple myeloma Mother    Cancer Sister        CERVICAL   Hypertension Sister    Cancer Brother 74       MELANOMA   Heart disease Maternal Grandmother    Other Maternal Grandmother 32       complications of childbirth   Heart disease Maternal Grandfather    Cancer Paternal Grandmother        ?    Heart disease Paternal Grandmother    Heart disease Paternal Grandfather    Cancer Brother        LUNG   Diabetes Sister    Hypertension Sister    Heart disease Sister    Cancer Sister    Cancer Brother    Pancreatic cancer Niece 54   Cancer Nephew 40       unknown- currently in the [employer]   Anesthesia problems Neg Hx    Hypotension Neg Hx    Pseudochol deficiency Neg Hx    Colon cancer Neg Hx    Esophageal cancer Neg Hx    Rectal cancer Neg Hx    Stomach cancer Neg Hx    Breast cancer Neg Hx    Social History   Occupational History   Occupation: Retired [employer]  Tobacco Use   Smoking status: Never   Smokeless tobacco: Never  Vaping Use   Vaping status: Never Used  Substance and Sexual Activity   Alcohol use: No     Alcohol/week: 0.0 standard drinks of alcohol   Drug use: No   Sexual activity: Not Currently    Birth control/protection: Surgical    Comment: 1st intercourse 68 yo-Fewer than 5 partners  Tobacco Counseling Counseling given: Not Answered  SDOH Screenings   Food Insecurity: No Food Insecurity (10/09/2024)  Housing: Unknown (10/09/2024)  Transportation Needs: No Transportation Needs (10/09/2024)  Utilities: Not At Risk (10/09/2024)  Alcohol Screen: Low Risk  (10/09/2024)  Depression (PHQ2-9): Low Risk  (10/09/2024)  Recent Concern: Depression (PHQ2-9) - Medium Risk (09/03/2024)  Financial Resource Strain: Low Risk  (10/09/2024)  Physical Activity: Sufficiently Active (10/09/2024)  Social Connections: Moderately Integrated (10/09/2024)  Stress: No Stress Concern Present (10/09/2024)  Tobacco Use: Low Risk  (10/09/2024)  Health Literacy: Adequate Health Literacy (10/09/2024)   Depression Screen    10/09/2024   11:19 AM 10/08/2024    1:43 PM 09/03/2024   10:40 AM 08/12/2024    5:04 PM 07/09/2024    1:01 PM 07/09/2024   12:45 PM 02/06/2024    4:18 PM  PHQ 2/9 Scores  PHQ - 2 Score 1 0 2 0 0 0 6  PHQ- 9 Score 3  10     11       Data saved with a previous flowsheet row definition     Goals Addressed             This Visit's Progress    Patient Stated       10/09/2024, wants to lose weight       Visit info / Clinical Intake: Medicare Wellness Visit Type:: Subsequent Annual Wellness Visit Medicare Wellness Visit Mode:: Telephone If telephone:: video declined If telephone or video:: unable to obtan vitals due to lack of equipment Interpreter Needed?: No Pre-visit prep was completed: yes AWV questionnaire completed by patient prior to visit?: no Living arrangements:: lives with spouse/significant other Patient's Overall Health Status Rating: good Typical amount of pain: (!) a lot Does pain affect daily life?: (!) yes Are you currently prescribed opioids?: (!) yes  Dietary Habits and  Nutritional Risks How many meals a day?: 2 Eats fruit and vegetables daily?: yes Most meals are obtained by: preparing own meals Diabetic:: (!) yes Any non-healing wounds?: no How often do you check your BS?: 2 Would you like to be referred to a Nutritionist or for Diabetic Management? : no  Functional Status Activities of Daily Living (to include ambulation/medication): Independent Ambulation: Independent with device- listed below Home Assistive Devices/Equipment: Walker (specify Type); Cane Medication Administration: Independent Home Management: Needs assistance (comment) (husband helps) Manage your own finances?: yes Primary transportation is: family/friends Concerns about vision?: no *vision screening is required for WTM* Concerns about hearing?: (!) yes (said eventually need hearing aids) Uses hearing aids?: no  Fall Screening Falls in the past year?: 1 (knee gives out) Number of falls in past year: 1 Was there an injury with Fall?: 1 (knots and bumps) Fall Risk Category Calculator: 3 Patient Fall Risk Level: High Fall Risk  Fall Risk Patient at Risk for Falls Due to: History of fall(s); Impaired balance/gait; Impaired mobility; Medication side effect Fall risk Follow up: Falls prevention discussed; Falls evaluation completed  Home and Transportation Safety: All rugs have non-skid backing?: N/A, no rugs All stairs or steps have railings?: yes Grab bars in the bathtub or shower?: yes Have non-skid surface in bathtub or shower?: yes Good home lighting?: yes Regular seat belt use?: yes Hospital stays in the last year:: (!) no; yes How many hospital stays:: 3 Reason: dehydration x 3  Cognitive Assessment Difficulty concentrating, remembering, or making decisions? : yes (at times) Will 6CIT or Mini Cog be Completed: yes What year is it?: 0 points What month  is it?: 0 points Give patient an address phrase to remember (5 components): [address] About what time  is it?: 0 points Count backwards from 20 to 1: 0 points Say the months of the year in reverse: 4 points Repeat the address phrase from earlier: 0 points 6 CIT Score: 4 points  Advance Directives (For Healthcare) Does Patient Have a Medical Advance Directive?: Yes Does patient want to make changes to medical advance directive?: No - Patient declined Type of Advance Directive: Healthcare Power of [place]; Living will Copy of Healthcare Power of Attorney in Chart?: No - copy requested Copy of Living Will in Chart?: No - copy requested Would patient like information on creating a medical advance directive?: No - Patient declined  Reviewed/Updated  Reviewed/Updated: All        Objective:    Today's Vitals   There is no height or weight on file to calculate BMI.  Current Medications (verified) Outpatient Encounter Medications as of 10/09/2024  Medication Sig   albuterol  (PROVENTIL ) (2.5 MG/3ML) 0.083% nebulizer solution Inhale 3 mLs (2.5 mg total) by nebulization every 6 (six) hours as needed for wheezing or shortness of breath (J45.40).   albuterol  (VENTOLIN  HFA) 108 (90 Base) MCG/ACT inhaler Inhale 1-2 puffs into the lungs every 4 (four) hours as needed for wheezing or shortness of breath.   atorvastatin  (LIPITOR ) 80 MG tablet Take 1 tablet (80 mg total) by mouth at bedtime.   budesonide -formoterol  (SYMBICORT ) 80-4.5 MCG/ACT inhaler Inhale 2 puffs into the lungs every morning and another 2 puffs 12 hours later.   buPROPion  (WELLBUTRIN  XL) 150 MG 24 hr tablet Take 1 tablet (150 mg total) by mouth daily.   Calcium  Carbonate-Vitamin D 600-200 MG-UNIT TABS Take 1 tablet by mouth daily.   Cholecalciferol  (VITAMIN D3) 50 MCG (2000 UT) capsule Take 2,000 Units by mouth daily.   CINNAMON PO Take 1,000 mg 2 (two) times daily by mouth.   dapagliflozin  propanediol (FARXIGA ) 10 MG TABS tablet Take 1 tablet (10 mg total) by mouth daily before breakfast.   doxepin  (SINEQUAN ) 50 MG capsule Take 1  capsule (50 mg total) by mouth at bedtime.   DULoxetine  (CYMBALTA ) 60 MG capsule Take 1 capsule (60 mg total) by mouth daily.   famotidine  (PEPCID ) 20 MG tablet Take 1 tablet (20 mg total) by mouth 2 (two) times daily.   fluticasone  (FLONASE ) 50 MCG/ACT nasal spray Place 1 spray into both nostrils daily as needed for allergies.   furosemide  (LASIX ) 20 MG tablet Take 1 tablet (20 mg total) by mouth daily as needed for ankle swelling.   glipiZIDE  (GLUCOTROL  XL) 5 MG 24 hr tablet Take 1 tablet (5 mg total) by mouth every morning.   levothyroxine  (SYNTHROID ) 75 MCG tablet Take 1 tablet (75 mcg total) by mouth daily before breakfast.   metFORMIN  (GLUCOPHAGE ) 500 MG tablet Take 1 tablet (500 mg total) by mouth 2 (two) times daily with a meal.   Misc Natural Products (GLUCOS-CHONDROIT-MSM COMPLEX PO) Take 2 tablets by mouth daily.   mometasone  (ELOCON ) 0.1 % cream Apply 1 Application topically daily as needed (for redness/raw). Apply topically behind ears   montelukast  (SINGULAIR ) 10 MG tablet Take 1 tablet (10 mg total) by mouth at bedtime. Need appointment for refills.   Multiple Vitamin (MULITIVITAMIN WITH MINERALS) TABS Take 1 tablet by mouth daily.   NEXIUM  40 MG capsule Take 1 capsule (40 mg total) by mouth in the morning and at bedtime.   nitroGLYCERIN  (NITROSTAT ) 0.4  MG SL tablet Place 1 tablet under the tongue every 5 minutes as needed for chest pain up to 3 times. If no relief, call 911.   NURTEC 75 MG TBDP Take 1 each by mouth daily as needed.   ondansetron  (ZOFRAN -ODT) 4 MG disintegrating tablet Dissolve 1 tablet (4 mg total) by mouth 3 (three) times daily as needed for nausea or vomiting.   oxyCODONE  (ROXICODONE ) 15 MG immediate release tablet Take 0.5 tablets (7.5 mg total) by mouth every 12 (twelve) hours as needed for pain.   potassium chloride  (KLOR-CON ) 10 MEQ tablet Take 2 tablets (20 mEq total) by mouth every morning.   promethazine  (PHENERGAN ) 25 MG tablet Take 1 tablet (25 mg total)  by mouth at bedtime.   senna-docusate (SENOKOT-S) 8.6-50 MG tablet Take 1 tablet by mouth 2 (two) times daily.   vitamin B-12 (CYANOCOBALAMIN ) 500 MCG tablet Take 500 mcg by mouth daily.    vitamin E 200 UNIT capsule Take 200 Units by mouth daily.   Facility-Administered Encounter Medications as of 10/09/2024  Medication   magnesium  sulfate IVPB 2 g 50 mL   Hearing/Vision screen Hearing Screening - Comments:: Slight but does not need hearing aids Vision Screening - Comments:: Regular eye exams, [facility] Immunizations and Health Maintenance Health Maintenance  Topic Date Due   FOOT EXAM  04/23/2024   COVID-19 Vaccine (4 - 2025-26 season) 08/03/2024   HEMOGLOBIN A1C  01/31/2025   Diabetic kidney evaluation - Urine ACR  02/06/2025   OPHTHALMOLOGY EXAM  04/29/2025   Mammogram  09/09/2025   Diabetic kidney evaluation - eGFR measurement  09/24/2025   Medicare Annual Wellness (AWV)  10/09/2025   Colonoscopy  07/11/2031   DTaP/Tdap/Td (3 - Td or Tdap) 06/26/2034   Pneumococcal Vaccine: 50+ Years  Completed   Influenza Vaccine  Completed   DEXA SCAN  Completed   Hepatitis C Screening  Completed   Zoster Vaccines- Shingrix   Completed   Meningococcal B Vaccine  Aged Out        Assessment/Plan:  This is a routine wellness examination for Shelby Anderson.  Patient Care Team: Ozell Heron HERO, MD as PCP - General (Family Medicine) Ladona Heinz, MD as PCP - Cardiology (Cardiology) Jude Harden GAILS, MD as Consulting Physician (Pulmonary Disease) Ladona Heinz, MD as Consulting Physician (Cardiology) Winfred Odor, MD as Referring Physician (Obstetrics and Gynecology) Marcine Koren Fallow, MD as Referring Physician (Cardiology) Orlando Anes, MD as Consulting Physician (Anesthesiology) Ebbie Cough, MD as Consulting Physician (General Surgery) Odean Potts, MD as Consulting Physician (Hematology and Oncology) Dewey Rush, MD as Consulting Physician (Radiation Oncology) Tobie Tonita POUR, DO as Consulting Physician (Neurology) Joshua Blamer, MD as Consulting Physician (Dermatology) Abran Rush SAILOR, MD as Consulting Physician (Gastroenterology) Lionell, Jon DEL, [facility] (Pharmacist) Karoline Lima, RN as Case Manager  I have personally reviewed and noted the following in the patient's chart:   Medical and social history Use of alcohol, tobacco or illicit drugs  Current medications and supplements including opioid prescriptions. Functional ability and status Nutritional status Physical activity Advanced directives List of other physicians Hospitalizations, surgeries, and ER visits in previous 12 months Vitals Screenings to include cognitive, depression, and falls Referrals and appointments  No orders of the defined types were placed in this encounter.  In addition, I have reviewed and discussed with patient certain preventive protocols, quality metrics, and best practice recommendations. A written personalized care plan for preventive services as well as general preventive health recommendations were provided to patient.  Shelby FORBES Dawn, LPN   [id number]/01/[id number]   Return in 1 year (on 10/09/2025).  After Visit Summary: (MyChart) Due to this being a telephonic visit, the after visit summary with patients personalized plan was offered to patient via MyChart   Nurse Notes: States sees podiatrist every three months.

## 2024-10-09 NOTE — Patient Instructions (Signed)
 Shelby Anderson,  Thank you for taking the time for your Medicare Wellness Visit. I appreciate your continued commitment to your health goals. Please review the care plan we discussed, and feel free to reach out if I can assist you further.  Please note that Annual Wellness Visits do not include a physical exam. Some assessments may be limited, especially if the visit was conducted virtually. If needed, we may recommend an in-person follow-up with your provider.  Ongoing Care Seeing your primary care provider every 3 to 6 months helps us  monitor your health and provide consistent, personalized care.   Referrals If a referral was made during today's visit and you haven't received any updates within two weeks, please contact the referred provider directly to check on the status.  Recommended Screenings:  Health Maintenance  Topic Date Due   Complete foot exam   04/23/2024   COVID-19 Vaccine (4 - 2025-26 season) 08/03/2024   Medicare Annual Wellness Visit  09/26/2024   Hemoglobin A1C  01/31/2025   Yearly kidney health urinalysis for diabetes  02/06/2025   Eye exam for diabetics  04/29/2025   Breast Cancer Screening  09/09/2025   Yearly kidney function blood test for diabetes  09/24/2025   Colon Cancer Screening  07/11/2031   DTaP/Tdap/Td vaccine (3 - Td or Tdap) 06/26/2034   Pneumococcal Vaccine for age over 51  Completed   Flu Shot  Completed   DEXA scan (bone density measurement)  Completed   Hepatitis C Screening  Completed   Zoster (Shingles) Vaccine  Completed   Meningitis B Vaccine  Aged Out       10/09/2024   11:22 AM  Advanced Directives  Does Patient Have a Medical Advance Directive? Yes  Type of Estate Agent of [place];Living will  Does patient want to make changes to medical advance directive? No - Patient declined  Copy of Healthcare Power of Attorney in Chart? No - copy requested    Vision: Annual vision screenings are recommended for early  detection of glaucoma, cataracts, and diabetic retinopathy. These exams can also reveal signs of chronic conditions such as diabetes and high blood pressure.  Dental: Annual dental screenings help detect early signs of oral cancer, gum disease, and other conditions linked to overall health, including heart disease and diabetes.  Please see the attached documents for additional preventive care recommendations.

## 2024-10-10 ENCOUNTER — Inpatient Hospital Stay

## 2024-10-10 MED ORDER — SODIUM CHLORIDE 0.9 % IV SOLN: INTRAVENOUS | Status: DC

## 2024-10-10 MED ORDER — MAGNESIUM SULFATE 2 GM/50ML IV SOLN
2.0000 g | Freq: Once | INTRAVENOUS | Status: AC
  Administered 2024-10-10: 2 g via INTRAVENOUS
  Filled 2024-10-10: qty 50

## 2024-10-10 NOTE — Patient Instructions (Signed)
 Magnesium Sulfate Injection What is this medication? MAGNESIUM SULFATE (mag NEE zee um SUL fate) prevents and treats low levels of magnesium in your body. It may also be used to prevent and treat seizures during pregnancy in people with high blood pressure disorders, such as preeclampsia or eclampsia. Magnesium plays an important role in maintaining the health of your muscles and nervous system. This medicine may be used for other purposes; ask your health care provider or pharmacist if you have questions. What should I tell my care team before I take this medication? They need to know if you have any of these conditions: Heart disease History of irregular heart beat Kidney disease An unusual or allergic reaction to magnesium sulfate, medications, foods, dyes, or preservatives Pregnant or trying to get pregnant Breast-feeding How should I use this medication? This medication is for infusion into a vein. It is given in a hospital or clinic setting. Talk to your care team about the use of this medication in children. While this medication may be prescribed for selected conditions, precautions do apply. Overdosage: If you think you have taken too much of this medicine contact a poison control center or emergency room at once. NOTE: This medicine is only for you. Do not share this medicine with others. What if I miss a dose? This does not apply. What may interact with this medication? Certain medications for anxiety or sleep Certain medications for seizures, such phenobarbital Digoxin Medications that relax muscles for surgery Narcotic medications for pain This list may not describe all possible interactions. Give your health care provider a list of all the medicines, herbs, non-prescription drugs, or dietary supplements you use. Also tell them if you smoke, drink alcohol, or use illegal drugs. Some items may interact with your medicine. What should I watch for while using this  medication? Your condition will be monitored carefully while you are receiving this medication. You may need blood work done while you are receiving this medication. What side effects may I notice from receiving this medication? Side effects that you should report to your care team as soon as possible: Allergic reactions--skin rash, itching, hives, swelling of the face, lips, tongue, or throat High magnesium level--confusion, drowsiness, facial flushing, redness, sweating, muscle weakness, fast or irregular heartbeat, trouble breathing Low blood pressure--dizziness, feeling faint or lightheaded, blurry vision Side effects that usually do not require medical attention (report to your care team if they continue or are bothersome): Headache Nausea This list may not describe all possible side effects. Call your doctor for medical advice about side effects. You may report side effects to FDA at 1-800-FDA-1088. Where should I keep my medication? This medication is given in a hospital or clinic and will not be stored at home. NOTE: This sheet is a summary. It may not cover all possible information. If you have questions about this medicine, talk to your doctor, pharmacist, or health care provider.  2024 Elsevier/Gold Standard (2021-08-02 00:00:00)

## 2024-10-10 NOTE — Progress Notes (Signed)
 For hypotension, patient denies any symptoms. She saw cardiology yesterday and had a systolic BP in the 70's and they had her walk around room until in came up into the 80's. She is very aware and knowledgeable regarding what medications they have on hold and/or stopped for her right now. She said she knows what orthostatic hypotension feels like from the past and she monitors her BP at home.

## 2024-10-12 ENCOUNTER — Other Ambulatory Visit: Payer: Self-pay | Admitting: Family Medicine

## 2024-10-12 ENCOUNTER — Other Ambulatory Visit: Payer: Self-pay | Admitting: Adult Health

## 2024-10-12 ENCOUNTER — Other Ambulatory Visit: Payer: Self-pay

## 2024-10-12 MED ORDER — LEVOTHYROXINE SODIUM 75 MCG PO TABS
75.0000 ug | ORAL_TABLET | Freq: Every day | ORAL | 4 refills | Status: AC
  Filled 2024-10-12: qty 30, 30d supply, fill #0
  Filled 2024-11-05 – 2024-11-09 (×3): qty 30, 30d supply, fill #1
  Filled 2024-12-09: qty 30, 30d supply, fill #2
  Filled 2025-01-08 (×2): qty 30, 30d supply, fill #3

## 2024-10-12 MED ORDER — MONTELUKAST SODIUM 10 MG PO TABS
10.0000 mg | ORAL_TABLET | Freq: Every evening | ORAL | 0 refills | Status: DC
  Filled 2024-10-12: qty 30, 30d supply, fill #0

## 2024-10-12 NOTE — Progress Notes (Unsigned)
 Established Patient Office Visit  Subjective   Patient ID: Shelby Anderson, female    DOB: 07/30/1956  Age: 68 y.o. MRN: [id number]  No chief complaint on file.   HPI   Current Outpatient Medications  Medication Instructions   albuterol  (PROVENTIL ) (2.5 MG/3ML) 0.083% nebulizer solution Inhale 3 mLs (2.5 mg total) by nebulization every 6 (six) hours as needed for wheezing or shortness of breath (J45.40).   albuterol  (VENTOLIN  HFA) 108 (90 Base) MCG/ACT inhaler 1-2 puffs, Inhalation, Every 4 hours PRN   atorvastatin  (LIPITOR ) 80 mg, Oral, Daily at bedtime   budesonide -formoterol  (SYMBICORT ) 80-4.5 MCG/ACT inhaler Inhale 2 puffs into the lungs every morning and another 2 puffs 12 hours later.   buPROPion  (WELLBUTRIN  XL) 150 mg, Oral, Daily   Calcium  Carbonate-Vitamin D 600-200 MG-UNIT TABS 1 tablet, Daily   CINNAMON PO 1,000 mg, 2 times daily   dapagliflozin  propanediol (FARXIGA ) 10 mg, Oral, Daily before breakfast   doxepin  (SINEQUAN ) 50 mg, Oral, Daily at bedtime   DULoxetine  (CYMBALTA ) 60 mg, Oral, Daily   famotidine  (PEPCID ) 20 mg, Oral, 2 times daily   fluticasone  (FLONASE ) 50 MCG/ACT nasal spray 1 spray, Daily PRN   furosemide  (LASIX ) 20 MG tablet Take 1 tablet (20 mg total) by mouth daily as needed for ankle swelling.   glipiZIDE  (GLUCOTROL  XL) 5 mg, Oral, BH-each morning   levothyroxine  (SYNTHROID ) 75 mcg, Oral, Daily before breakfast   metFORMIN  (GLUCOPHAGE ) 500 mg, Oral, 2 times daily with meals   Misc Natural Products (GLUCOS-CHONDROIT-MSM COMPLEX PO) 2 tablets, Daily   mometasone  (ELOCON ) 0.1 % cream 1 Application, Daily PRN   montelukast  (SINGULAIR ) 10 mg, Oral, Nightly, Need appointment for refills.   Multiple Vitamin (MULITIVITAMIN WITH MINERALS) TABS 1 tablet, Daily   NexIUM  40 mg, Oral, 2 times daily   nitroGLYCERIN  (NITROSTAT ) 0.4 MG SL tablet Place 1 tablet under the tongue every 5 minutes as needed for chest pain up to 3 times. If no relief, call 911.    NURTEC 75 MG TBDP 1 each, Oral, Daily PRN   ondansetron  (ZOFRAN -ODT) 4 MG disintegrating tablet Dissolve 1 tablet (4 mg total) by mouth 3 (three) times daily as needed for nausea or vomiting.   oxyCODONE  (ROXICODONE ) 7.5 mg, Oral, Every 12 hours PRN   potassium chloride  (KLOR-CON ) 10 MEQ tablet 20 mEq, Oral, Every morning   promethazine  (PHENERGAN ) 25 mg, Oral, Daily at bedtime   senna-docusate (SENOKOT-S) 8.6-50 MG tablet 1 tablet, Oral, 2 times daily   vitamin B-12 (CYANOCOBALAMIN ) 500 mcg, Daily   Vitamin D3 2,000 Units, Daily   vitamin E 200 Units, Daily    Patient Active Problem List   Diagnosis Date Noted   Acute encephalopathy 08/03/2024   Moderate episode of recurrent major depressive disorder (HCC) 02/10/2024   UTI (urinary tract infection) 08/01/2023   Nausea and vomiting 07/31/2023   Acute cystitis 07/31/2023   Hypothyroidism 09/11/2022   Dental caries 06/12/2022   Spondylosis without myelopathy or radiculopathy, cervical region 04/21/2021   Neutropenia with fever 10/01/2019   Genetic testing 09/17/2019   Family history of melanoma    Family history of pancreatic cancer    Malignant neoplasm of upper-outer quadrant of left breast in female, estrogen receptor negative (HCC) 09/07/2019   Encounter for loop recorder at end of battery life 04/24/2018   Esophageal stricture 07/01/2017   Hyperlipidemia associated with type 2 diabetes mellitus (HCC) 05/20/2017   Allergic rhinitis 03/11/2017   Dilated cardiomyopathy (HCC) 02/07/2017   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 11/13/2016   Morbid obesity due to excess calories (HCC) 09/26/2016   Increased endometrial stripe thickness 06/27/2016   Intramural leiomyoma of uterus 06/27/2016   Type 2 diabetes mellitus without complication, without long-term current use of insulin  (HCC) 12/06/2015   HTN (hypertension), benign 07/19/2015   Arrhythmia 07/19/2015   Orthostatic hypotension 07/19/2015   GERD (gastroesophageal  reflux disease) 07/19/2015   Chronic back pain 07/19/2015   Neuropathy 07/19/2015   Symptomatic PVCs 11/02/2014   Syncope 11/02/2014   Status post placement of implantable loop recorder 11/02/2014   Stenosis of cervix 01/07/2014   Chest pain 12/23/2013   Disorder of cervix 03/10/2013   Vaginal atrophy 03/10/2013   OSA (obstructive sleep apnea) 07/31/2012   Asthma 04/29/2012   Coronary artery disease 11/20/2011     ROS    Objective:     There were no vitals taken for this visit. {Vitals History (Optional):23777}  Physical Exam   No results found for any visits on 10/12/24.  {Labs (Optional):23779}  The ASCVD Risk score (Arnett DK, et al., 2019) failed to calculate for the following reasons:   Risk score cannot be calculated because patient has a medical history suggesting prior/existing ASCVD    Assessment & Plan:  There are no diagnoses linked to this encounter.   No follow-ups on file.    Heron CHRISTELLA Sharper, MD

## 2024-10-13 ENCOUNTER — Other Ambulatory Visit: Payer: Self-pay

## 2024-10-14 ENCOUNTER — Other Ambulatory Visit: Payer: Self-pay | Admitting: Family Medicine

## 2024-10-14 ENCOUNTER — Other Ambulatory Visit (HOSPITAL_COMMUNITY): Payer: Self-pay

## 2024-10-14 ENCOUNTER — Other Ambulatory Visit: Payer: Self-pay

## 2024-10-14 ENCOUNTER — Encounter: Payer: Self-pay | Admitting: Family Medicine

## 2024-10-14 ENCOUNTER — Ambulatory Visit (INDEPENDENT_AMBULATORY_CARE_PROVIDER_SITE_OTHER): Admitting: Family Medicine

## 2024-10-14 VITALS — BP 88/48 | HR 69 | Temp 98.1°F | Ht 66.0 in | Wt 192.6 lb

## 2024-10-14 DIAGNOSIS — E1143 Type 2 diabetes mellitus with diabetic autonomic (poly)neuropathy: Secondary | ICD-10-CM | POA: Diagnosis not present

## 2024-10-14 DIAGNOSIS — K3184 Gastroparesis: Secondary | ICD-10-CM | POA: Diagnosis not present

## 2024-10-14 MED ORDER — TIZANIDINE HCL 4 MG PO TABS
8.0000 mg | ORAL_TABLET | Freq: Three times a day (TID) | ORAL | 2 refills | Status: AC
  Filled 2024-10-14 – 2024-10-15 (×2): qty 540, 90d supply, fill #0

## 2024-10-14 MED ORDER — TIZANIDINE HCL 4 MG PO TABS
4.0000 mg | ORAL_TABLET | Freq: Three times a day (TID) | ORAL | 2 refills | Status: AC
  Filled 2024-10-14: qty 90, 30d supply, fill #0

## 2024-10-14 MED ORDER — PREGABALIN 150 MG PO CAPS
150.0000 mg | ORAL_CAPSULE | Freq: Three times a day (TID) | ORAL | 2 refills | Status: AC
  Filled 2024-10-14: qty 90, 30d supply, fill #0
  Filled 2024-12-16 (×2): qty 90, 30d supply, fill #1

## 2024-10-14 MED ORDER — OXYCODONE HCL 10 MG PO TABS
10.0000 mg | ORAL_TABLET | Freq: Four times a day (QID) | ORAL | 0 refills | Status: AC
  Filled 2024-10-14: qty 109, 27d supply, fill #0
  Filled 2024-10-14: qty 11, 3d supply, fill #0

## 2024-10-14 NOTE — Progress Notes (Signed)
 Established Patient Office Visit  Subjective   Patient ID: Shelby Anderson, female    DOB: 04-Dec-1955  Age: 68 y.o. MRN: [id number]  Chief Complaint  Patient presents with   Results    Patient presents post-infusion due to low magnesium  levels    HPI Discussed the use of AI scribe software for clinical note transcription with the patient, who gave verbal consent to proceed.  History of Present Illness   Shelby Anderson is a 68 year old female with gastroparesis and diabetes who presents with low magnesium  levels.  She received a 2-gram magnesium  infusion, which increased her magnesium  level to 1.5. She takes magnesium  supplements from [employer] but does not recall the dose. She experiences dizziness upon standing, with blood pressure readings as low as 76. She is not taking her blood pressure medication and only uses Lasix  if her legs are swollen, which has not occurred in the past month.  She manages gastroparesis by eating small, frequent meals and using Zofran  30 minutes before meals to control nausea. Her recent blood sugar level was 6.5.  Her bowel movements are regular with the use of Senokot at bedtime.       Current Outpatient Medications  Medication Instructions   albuterol  (PROVENTIL ) (2.5 MG/3ML) 0.083% nebulizer solution Inhale 3 mLs (2.5 mg total) by nebulization every 6 (six) hours as needed for wheezing or shortness of breath (J45.40).   albuterol  (VENTOLIN  HFA) 108 (90 Base) MCG/ACT inhaler 1-2 puffs, Inhalation, Every 4 hours PRN   atorvastatin  (LIPITOR ) 80 mg, Oral, Daily at bedtime   budesonide -formoterol  (SYMBICORT ) 80-4.5 MCG/ACT inhaler Inhale 2 puffs into the lungs every morning and another 2 puffs 12 hours later.   buPROPion  (WELLBUTRIN  XL) 150 mg, Oral, Daily   Calcium  Carbonate-Vitamin D 600-200 MG-UNIT TABS 1 tablet, Daily   CINNAMON PO 1,000 mg, 2 times daily   dapagliflozin  propanediol (FARXIGA ) 10 mg, Oral, Daily before breakfast   doxepin   (SINEQUAN ) 50 mg, Oral, Daily at bedtime   DULoxetine  (CYMBALTA ) 60 mg, Oral, Daily   famotidine  (PEPCID ) 20 mg, Oral, 2 times daily   fluticasone  (FLONASE ) 50 MCG/ACT nasal spray 1 spray, Daily PRN   furosemide  (LASIX ) 20 MG tablet Take 1 tablet (20 mg total) by mouth daily as needed for ankle swelling.   glipiZIDE  (GLUCOTROL  XL) 5 mg, Oral, BH-each morning   levothyroxine  (SYNTHROID ) 75 mcg, Oral, Daily before breakfast   metFORMIN  (GLUCOPHAGE ) 500 mg, Oral, 2 times daily with meals   Misc Natural Products (GLUCOS-CHONDROIT-MSM COMPLEX PO) 2 tablets, Daily   mometasone  (ELOCON ) 0.1 % cream 1 Application, Daily PRN   montelukast  (SINGULAIR ) 10 mg, Oral, Nightly, Need appointment for refills.   Multiple Vitamin (MULITIVITAMIN WITH MINERALS) TABS 1 tablet, Daily   NexIUM  40 mg, Oral, 2 times daily   nitroGLYCERIN  (NITROSTAT ) 0.4 MG SL tablet Place 1 tablet under the tongue every 5 minutes as needed for chest pain up to 3 times. If no relief, call 911.   NURTEC 75 MG TBDP 1 each, Oral, Daily PRN   ondansetron  (ZOFRAN -ODT) 4 MG disintegrating tablet Dissolve 1 tablet (4 mg total) by mouth 3 (three) times daily as needed for nausea or vomiting.   oxyCODONE  (ROXICODONE ) 7.5 mg, Oral, Every 12 hours PRN   potassium chloride  (KLOR-CON ) 10 MEQ tablet 20 mEq, Oral, Every morning   promethazine  (PHENERGAN ) 25 mg, Oral, Daily at bedtime   senna-docusate (SENOKOT-S) 8.6-50 MG tablet 1 tablet, Oral, 2 times daily   vitamin B-12 (CYANOCOBALAMIN ) 500  mcg, Daily   Vitamin D3 2,000 Units, Daily   vitamin E 200 Units, Daily    Patient Active Problem List   Diagnosis Date Noted   Diabetic gastroparesis associated with type 2 diabetes mellitus (HCC) 10/14/2024   Acute encephalopathy 08/03/2024   Moderate episode of recurrent major depressive disorder (HCC) 02/10/2024   UTI (urinary tract infection) 08/01/2023   Nausea and vomiting 07/31/2023   Acute cystitis 07/31/2023   Hypothyroidism 09/11/2022    Dental caries 06/12/2022   Spondylosis without myelopathy or radiculopathy, cervical region 04/21/2021   Neutropenia with fever 10/01/2019   Genetic testing 09/17/2019   Family history of melanoma    Family history of pancreatic cancer    Malignant neoplasm of upper-outer quadrant of left breast in female, estrogen receptor negative (HCC) 09/07/2019   Encounter for loop recorder at end of battery life 04/24/2018   Esophageal stricture 07/01/2017   Hyperlipidemia associated with type 2 diabetes mellitus (HCC) 05/20/2017   Allergic rhinitis 03/11/2017   Dilated cardiomyopathy (HCC) 02/07/2017   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 11/13/2016   Morbid obesity due to excess calories (HCC) 09/26/2016   Increased endometrial stripe thickness 06/27/2016   Intramural leiomyoma of uterus 06/27/2016   Type 2 diabetes mellitus without complication, without long-term current use of insulin  (HCC) 12/06/2015   HTN (hypertension), benign 07/19/2015   Arrhythmia 07/19/2015   Orthostatic hypotension 07/19/2015   GERD (gastroesophageal reflux disease) 07/19/2015   Chronic back pain 07/19/2015   Neuropathy 07/19/2015   Symptomatic PVCs 11/02/2014   Syncope 11/02/2014   Status post placement of implantable loop recorder 11/02/2014   Stenosis of cervix 01/07/2014   Chest pain 12/23/2013   Disorder of cervix 03/10/2013   Vaginal atrophy 03/10/2013   OSA (obstructive sleep apnea) 07/31/2012   Asthma 04/29/2012   Coronary artery disease 11/20/2011     Review of Systems  All other systems reviewed and are negative.     Objective:     BP (!) 88/48   Pulse 69   Temp 98.1 F (36.7 C) (Oral)   Ht 5' 6 (1.676 m)   Wt 192 lb 9.6 oz (87.4 kg)   SpO2 98%   BMI 31.09 kg/m    Physical Exam Vitals reviewed.  Constitutional:      Appearance: Normal appearance. She is well-groomed. She is obese.  Eyes:     Conjunctiva/sclera: Conjunctivae normal.  Neck:     Thyroid : No thyromegaly.   Cardiovascular:     Rate and Rhythm: Normal rate and regular rhythm.     Heart sounds: S1 normal and S2 normal.  Pulmonary:     Effort: Pulmonary effort is normal.     Breath sounds: Normal breath sounds and air entry.  Abdominal:     General: Bowel sounds are normal.  Musculoskeletal:     Right lower leg: No edema.     Left lower leg: No edema.  Neurological:     Mental Status: She is alert and oriented to person, place, and time. Mental status is at baseline.     Gait: Gait is intact.  Psychiatric:        Mood and Affect: Mood and affect normal.        Speech: Speech normal.        Behavior: Behavior normal.        Judgment: Judgment normal.      No results found for any visits on 10/14/24.    The ASCVD Risk score (Arnett DK,  et al., 2019) failed to calculate for the following reasons:   Risk score cannot be calculated because patient has a medical history suggesting prior/existing ASCVD    Assessment & Plan:  Diabetic gastroparesis associated with type 2 diabetes mellitus (HCC)  Hypomagnesemia -     Magnesium ; Future   Assessment and Plan    Hypomagnesemia Recent magnesium  level of 1.5 after a 2-gram infusion. Currently taking magnesium  supplements, but the dose is unclear. No significant gastrointestinal symptoms reported that could contribute to magnesium  loss. Potential medication-related causes are unlikely. - Increase magnesium  supplementation to 400 mg twice daily for 30 days. - Avoid magnesium  citrate due to potential for diarrhea. - Will recheck magnesium  levels in one month with a lab appointment. - If diarrhea occurs, reduce magnesium  to 400 mg once daily.  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 Currently managed with Zofran  for nausea, which is effective. Blood sugar levels are well-controlled with a recent HbA1c of 6.5%. - Continue Zofran  for nausea  management. - Consider metoclopramide  (Reglan ) if Zofran  becomes ineffective. - Encouraged small, frequent meals to manage gastroparesis symptoms.  Hypotension Blood pressure has been low for a couple of weeks, with readings as low as 76 mmHg. Currently not taking blood pressure medication. Recent echocardiogram showed cardiac issues, and an MRI is pending. - Continue to hold blood pressure medication until further evaluation. - Monitor blood pressure and symptoms of dizziness. - Avoid diuretics like Lasix  unless necessary for swelling.        Return for lab appointment for december 12.    Heron CHRISTELLA Sharper, MD

## 2024-10-14 NOTE — Patient Instructions (Signed)
 Magnesium  glycinate or magnesium  oxide -- 400 mg twice a day

## 2024-10-15 ENCOUNTER — Other Ambulatory Visit: Payer: Self-pay

## 2024-10-15 ENCOUNTER — Other Ambulatory Visit (HOSPITAL_COMMUNITY): Payer: Self-pay

## 2024-10-16 ENCOUNTER — Encounter (HOSPITAL_COMMUNITY): Payer: Self-pay

## 2024-10-17 ENCOUNTER — Other Ambulatory Visit (HOSPITAL_COMMUNITY): Payer: Self-pay

## 2024-10-20 ENCOUNTER — Other Ambulatory Visit: Payer: Self-pay | Admitting: Cardiology

## 2024-10-20 ENCOUNTER — Other Ambulatory Visit: Payer: Self-pay

## 2024-10-20 ENCOUNTER — Ambulatory Visit (HOSPITAL_COMMUNITY)
Admission: RE | Admit: 2024-10-20 | Discharge: 2024-10-20 | Disposition: A | Discharge status: Home / Self Care | Source: Ambulatory Visit | Attending: Cardiology | Admitting: Cardiology

## 2024-10-20 DIAGNOSIS — I517 Cardiomegaly: Secondary | ICD-10-CM

## 2024-10-20 MED ORDER — GADOBUTROL 1 MMOL/ML IV SOLN
12.0000 mL | Freq: Once | INTRAVENOUS | Status: AC | PRN
  Administered 2024-10-20: 12 mL via INTRAVENOUS

## 2024-10-23 ENCOUNTER — Other Ambulatory Visit: Payer: Self-pay

## 2024-10-26 NOTE — Patient Instructions (Signed)
 Thank you for allowing the Complex Care Management team to participate in your care. It was great speaking with you!  Congratulations on meeting your care management goals! Keep up the great work managing your care! A message will be relayed to Dr. Ozell regarding the status of order for your Contour glucose monitoring device.  Please do not hesitate to notify Dr. Ozell if your health needs change and you require additional outreach. The care team will gladly assist.   Jackson Acron [facility] RN Care Manager Direct Dial: [phone]  Fax: [phone] Website: [email or web address]

## 2024-10-26 NOTE — Patient Outreach (Signed)
 Complex Care Management   Visit Note    Name:  Shelby Anderson MRN: [id number] DOB: 01-Jul-1956  Situation: Referral received for Complex Care Management related to Diabetes I obtained verbal consent from Patient.  Visit completed with [name] via telephone.  Background:   Past Medical History:  Diagnosis Date   Allergy     Anemia    chronic   Angina    Prinzmetal angina   Anxiety and depression    Arthritis    Asthma    Atrial fibrillation (HCC)    h/o AF w/frequent PVCs   Breast cancer (HCC)    Left   Cancer (HCC)    hx of skin cancer    CHF (congestive heart failure) (HCC)    Chronic back pain greater than 3 months duration    on chronic narcotics, treated at pain clinic   Chronic respiratory failure (HCC) 09/15/2015   ONO 09/04/15 + desats >begin O2 at 2l/ m    Colon polyps    hyperplastic   Coronary artery disease    Arrythmia, orthostatic hypotension, HLD, HTN; sees Dr. Ladona   Difficult intubation    TMJ & woke up when they were still cutting on me   Dysrhythmia    sees Dr. 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 Ladona   OSA (obstructive sleep apnea)    Personal history of chemotherapy    Personal history of radiation therapy    Pneumonia    multiple times   PONV (postoperative nausea and vomiting)    Recurrent upper respiratory infection (URI)    S/P left TKA  09/25/2016   Shortness of breath 11/20/11   all the time, sees pulmonlogy, ? asthma   Stenotic cervical os    Stomach ulcer    3 small; found in 05/2011   TMJ (dislocation of temporomandibular joint)    Tuberculosis    + TB SKIN TEST   Type 2 diabetes mellitus without complication, without long-term current use of insulin  (HCC) 12/06/2015   not on meds     Assessment: Patient Reported Symptoms:  Cognitive Cognitive Status: Alert and oriented to person, place, and time, Normal speech and language skills Cognitive/Intellectual Conditions Management [RPT]: None reported or documented in medical history or problem list Health Maintenance Behaviors: Annual physical exam, Healthy diet, Hobbies, Stress management Healing Pattern: Average Health Facilitated by: Rest, Healthy diet  Neurological Neurological Review of Symptoms: No symptoms reported Neurological Management Strategies: Routine screening Neurological Self-Management Outcome: 4 (good)  HEENT HEENT Symptoms Reported: No symptoms reported HEENT Management Strategies: Routine screening HEENT Self-Management Outcome: 4 (good)  Cardiovascular Cardiovascular Symptoms Reported: No symptoms reported Does patient have uncontrolled Hypertension?: No Cardiovascular Self-Management Outcome: 4 (good)  Respiratory  Respiratory Symptoms Reported: No symptoms reported Respiratory Self-Management Outcome: 5 (very good)  Endocrine Endocrine Symptoms Reported: No symptoms reported Is patient diabetic?: Yes Is patient checking blood sugars at home?: Yes List most recent blood sugar readings, include date and time of day: Reports reading of 122 mg/dl Endocrine Self-Management Outcome: 4 (good)  Gastrointestinal Gastrointestinal Symptoms Reported: No symptoms reported Additional Gastrointestinal Details: gastroparesis Gastrointestinal Self-Management Outcome: 4 (good)  Genitourinary Genitourinary Symptoms Reported: No symptoms  reported Genitourinary Self-Management Outcome: 4 (good)  Integumentary Integumentary Symptoms Reported: No symptoms reported Skin Self-Management Outcome: 4 (good)  Musculoskeletal Musculoskelatal Symptoms Reviewed: No symptoms reported Musculoskeletal Management Strategies: Medical device, Medication therapy, Routine screening Musculoskeletal Self-Management Outcome: 4 (good)  Psychosocial Psychosocial Symptoms Reported: No symptoms reported Quality of Family Relationships: supportive Do you feel physically threatened by others?: No    10/26/2024    PHQ2-9 Depression Screening   Little interest or pleasure in doing things Not at all  Feeling down, depressed, or hopeless Not at all  PHQ-2 - Total Score 0    There were no vitals filed for this visit. Pain Scale: 0-10 Pain Score: 0-No pain  Medications Reviewed Today     Reviewed by Karoline Lima, RN (Registered Nurse) on 10/23/24 at 1306  Med List Status: <None>   Medication Order Taking? Sig Documenting Provider Last Dose Status Informant  albuterol  (PROVENTIL ) (2.5 MG/3ML) 0.083% nebulizer solution [id number]  Inhale 3 mLs (2.5 mg total) by nebulization every 6 (six) hours as needed for wheezing or shortness of breath (J45.40). Parrett, Madelin RAMAN, NP  Active Self, Pharmacy Records  albuterol  (VENTOLIN  HFA) 108 (90 Base) MCG/ACT inhaler [id number]  Inhale 1-2 puffs into the lungs every 4 (four) hours as needed for wheezing or shortness of breath. Parrett, Madelin RAMAN, NP  Active Self, Pharmacy Records  atorvastatin  (LIPITOR ) 80 MG tablet [id number]  Take 1 tablet (80 mg total) by mouth at bedtime. Ozell Heron HERO, MD  Active Self, Pharmacy Records  budesonide -formoterol  (SYMBICORT ) 80-4.5 MCG/ACT inhaler [id number]  Inhale 2 puffs into the lungs every morning and another 2 puffs 12 hours later. Parrett, Madelin RAMAN, NP  Active Self, Pharmacy Records  buPROPion  (WELLBUTRIN  XL) 150 MG 24 hr tablet [id number]  Take 1 tablet (150 mg total) by  mouth daily. Ozell Heron HERO, MD  Active   Calcium  Carbonate-Vitamin D 600-200 MG-UNIT TABS [id number]  Take 1 tablet by mouth daily. [provider]  Active Self, Pharmacy Records  Cholecalciferol  (VITAMIN D3) 50 MCG (2000 UT) capsule [id number]  Take 2,000 Units by mouth daily. [provider]  Active Self, Pharmacy Records  CINNAMON PO [id number]  Take 1,000 mg 2 (two) times daily by mouth. [provider]  Active Self, Pharmacy Records  dapagliflozin  propanediol (FARXIGA ) 10 MG TABS tablet [id number]  Take 1 tablet (10 mg total) by mouth daily before breakfast. Ozell Heron HERO, MD  Active   doxepin  (SINEQUAN ) 50 MG capsule [id number]  Take 1 capsule (50 mg total) by mouth at bedtime.   Active   DULoxetine  (CYMBALTA ) 60 MG capsule [id number]  Take 1 capsule (60 mg total) by mouth daily. Ozell Heron HERO, MD  Active   famotidine  (PEPCID ) 20 MG tablet [id number]  Take 1 tablet (20 mg total) by mouth 2 (two) times daily. [place], PA-C  Active Self, Pharmacy Records  fluticasone  (FLONASE ) 50 MCG/ACT nasal spray [id number]  Place 1 spray into both nostrils daily as needed for allergies. [provider]  Active Self, Pharmacy Records  furosemide  (LASIX )  20 MG tablet [id number]  Take 1 tablet (20 mg total) by mouth daily as needed for ankle swelling. Ozell Heron HERO, MD  Active Self, Pharmacy Records  glipiZIDE  (GLUCOTROL  XL) 5 MG 24 hr tablet [id number]  Take 1 tablet (5 mg total) by mouth every morning. Ozell Heron HERO, MD  Active Self, Pharmacy Records  levothyroxine  (SYNTHROID ) 75 MCG tablet [id number]  Take 1 tablet (75 mcg total) by mouth daily before breakfast. Ozell Heron HERO, MD  Active   magnesium  sulfate IVPB 2 g 50 mL [id number]   [place]ford Morna Pickle, NP  Active   metFORMIN  (GLUCOPHAGE ) 500 MG tablet [id number]  Take 1 tablet (500 mg total) by mouth 2 (two) times daily with a meal. Ozell Heron HERO, MD  Active   Misc Natural Products  (GLUCOS-CHONDROIT-MSM COMPLEX PO) [id number]  Take 2 tablets by mouth daily. [provider]  Active Self, Pharmacy Records  mometasone  (ELOCON ) 0.1 % cream [id number]  Apply 1 Application topically daily as needed (for redness/raw). Apply topically behind ears [provider]  Active Self, Pharmacy Records  montelukast  (SINGULAIR ) 10 MG tablet [id number]  Take 1 tablet (10 mg total) by mouth at bedtime. Need appointment for refills. Ozell Heron HERO, MD  Active   Multiple Vitamin [facility] WITH MINERALS) TABS [id number]  Take 1 tablet by mouth daily. [provider]  Active Self, Pharmacy Records  NEXIUM  40 MG capsule [id number]  Take 1 capsule (40 mg total) by mouth in the morning and at bedtime. [place], PA-C  Active Self, Pharmacy Records  nitroGLYCERIN  (NITROSTAT ) 0.4 MG SL tablet [id number]  Place 1 tablet under the tongue every 5 minutes as needed for chest pain up to 3 times. If no relief, call 911. Ladona Heinz, MD  Active Self, Pharmacy Records  NURTEC 75 MG TBDP [id number]  Take 1 each by mouth daily as needed. Ozell Heron HERO, MD  Active Self, Pharmacy Records  ondansetron  (ZOFRAN -ODT) 4 MG disintegrating tablet [id number]  Dissolve 1 tablet (4 mg total) by mouth 3 (three) times daily as needed for nausea or vomiting. Abran Norleen SAILOR, MD  Active   oxyCODONE  (ROXICODONE ) 15 MG immediate release tablet [id number]  Take 0.5 tablets (7.5 mg total) by mouth every 12 (twelve) hours as needed for pain. Elgergawy, Brayton RAMAN, MD  Active            Med Note LORICE, CATHLEAN DEL   Wed Aug 12, 2024  3:56 PM) Need prescription   Oxycodone  HCl 10 MG TABS [id number]  Take 1 tablet by mouth four times a day. Stop oxycodone  15 mg   Active   potassium chloride  (KLOR-CON ) 10 MEQ tablet [id number]  Take 2 tablets (20 mEq total) by mouth every morning. Ozell Heron HERO, MD  Active Self, Pharmacy Records  pregabalin  (LYRICA ) 150 MG capsule [id number]  Take 1 capsule (150 mg total) by  mouth 3 (three) times daily.   Active   promethazine  (PHENERGAN ) 25 MG tablet [id number]  Take 1 tablet (25 mg total) by mouth at bedtime. Abran Norleen SAILOR, MD  Active   senna-docusate (SENOKOT-S) 8.6-50 MG tablet [id number]  Take 1 tablet by mouth 2 (two) times daily. Elgergawy, Brayton RAMAN, MD  Active   tiZANidine  (ZANAFLEX ) 4 MG tablet [id number]  Take 2 tablets (8 mg total) by mouth 3 (three) times daily. Ozell Heron HERO, MD  Active   tiZANidine  (ZANAFLEX ) 4 MG tablet [id number]  Take 1 tablet (4 mg total) by mouth 3 (three) times daily.  Active   vitamin B-12 (CYANOCOBALAMIN ) 500 MCG tablet [id number]  Take 500 mcg by mouth daily.  [provider]  Active Self, Pharmacy Records  vitamin E 200 UNIT capsule [id number]  Take 200 Units by mouth daily. [provider]  Active Self, Pharmacy Records            Recommendation:   Continue Current Plan of Care  Follow Up Plan:   Patient has met all care management goals. Care Management case will be closed. Patient has been provided contact information should new needs arise.    [facility] Population Health RN Care Manager Direct Dial: [phone]  Fax: [phone] Website: [email or web address]

## 2024-10-27 ENCOUNTER — Other Ambulatory Visit: Payer: Self-pay

## 2024-10-27 ENCOUNTER — Other Ambulatory Visit: Payer: Self-pay | Admitting: Family Medicine

## 2024-10-27 MED ORDER — ATORVASTATIN CALCIUM 80 MG PO TABS
80.0000 mg | ORAL_TABLET | Freq: Every day | ORAL | 5 refills | Status: AC
  Filled 2024-11-05 – 2024-11-09 (×3): qty 30, 30d supply, fill #0
  Filled 2024-12-09: qty 30, 30d supply, fill #1
  Filled 2025-01-08 (×2): qty 30, 30d supply, fill #2

## 2024-10-27 MED ORDER — MONTELUKAST SODIUM 10 MG PO TABS
10.0000 mg | ORAL_TABLET | Freq: Every evening | ORAL | 5 refills | Status: AC
  Filled 2024-11-05 – 2024-11-09 (×3): qty 30, 30d supply, fill #0
  Filled 2024-12-09: qty 30, 30d supply, fill #1
  Filled 2025-01-08 (×2): qty 30, 30d supply, fill #2

## 2024-10-28 ENCOUNTER — Other Ambulatory Visit (HOSPITAL_COMMUNITY): Payer: Self-pay

## 2024-10-28 ENCOUNTER — Other Ambulatory Visit: Payer: Self-pay

## 2024-11-05 ENCOUNTER — Other Ambulatory Visit (HOSPITAL_COMMUNITY): Payer: Self-pay

## 2024-11-06 ENCOUNTER — Other Ambulatory Visit (HOSPITAL_COMMUNITY): Payer: Self-pay

## 2024-11-06 ENCOUNTER — Other Ambulatory Visit: Payer: Self-pay

## 2024-11-07 ENCOUNTER — Other Ambulatory Visit: Payer: Self-pay | Admitting: Internal Medicine

## 2024-11-07 DIAGNOSIS — K219 Gastro-esophageal reflux disease without esophagitis: Secondary | ICD-10-CM

## 2024-11-09 ENCOUNTER — Other Ambulatory Visit: Payer: Self-pay

## 2024-11-10 ENCOUNTER — Other Ambulatory Visit (HOSPITAL_COMMUNITY): Payer: Self-pay

## 2024-11-10 ENCOUNTER — Other Ambulatory Visit: Payer: Self-pay

## 2024-11-12 ENCOUNTER — Encounter: Payer: Self-pay | Admitting: Podiatry

## 2024-11-12 ENCOUNTER — Ambulatory Visit: Admitting: Podiatry

## 2024-11-12 DIAGNOSIS — E1151 Type 2 diabetes mellitus with diabetic peripheral angiopathy without gangrene: Secondary | ICD-10-CM | POA: Diagnosis not present

## 2024-11-12 DIAGNOSIS — M79675 Pain in left toe(s): Secondary | ICD-10-CM

## 2024-11-12 DIAGNOSIS — B351 Tinea unguium: Secondary | ICD-10-CM

## 2024-11-12 DIAGNOSIS — M79674 Pain in right toe(s): Secondary | ICD-10-CM

## 2024-11-12 NOTE — Progress Notes (Signed)

## 2024-11-15 ENCOUNTER — Ambulatory Visit: Payer: Self-pay | Admitting: Cardiology

## 2024-11-15 NOTE — Progress Notes (Unsigned)
 Cardiology Office Note    Date:  11/19/2024  ID:  Shelby Anderson, DOB 07-Nov-1956, MRN [id number] PCP:  Ozell Heron HERO, MD  Cardiologist:  Gordy Bergamo, MD  Electrophysiologist:  None   Chief Complaint: Follow up for HTN and syncope  History of Present Illness: .   Shelby Anderson is a 68 y.o. female with visit-pertinent history of NSTEMI with normal coronary arteries, diabetes mellitus, orthostatic hypotension, chronic chest pain syndrome, recurrent syncope s/p loop recorder implantation by [facility] cardiology but no obvious etiology found, severe back pain, bronchial asthma and severe restrictive lung disease, severe GERD, distal esophageal stricture s/p dilation, peptic ulcer disease, depression.   Patient was recently seen on 06/19/2024 by Dr. Bergamo with persistent leg swelling.  Patient reported leg swelling despite taking double doses of Lasix .  She had noted that swelling was particularly severe a few days prior described as 4+ edema.  It was noted that her potassium level had been consistently low requiring daily supplementation.  Patient reported occasional chest pain which she described as normal with no significant changes.  Patient was started on spironolactone  25 mg daily, recommended to follow-up as needed.   On chart review patient was admitted on 08/03/2024 with altered mental status, vomiting.  It was felt that patient had altered mental status related to encephalopathy given combination of pain medications and anxiety/depression medications.  With adjustments to medications patient returned to baseline.  Brain imaging without acute intracranial abnormality, noted to have some evidence of advanced chronic signal changes and cerebral white matter atrophy.  Following up with her PCP/neurology regarding this.   Patient was last seen in clinic on 08/25/2024 regarding medication changes made during her hospitalization.  She reported that her blood pressure was significantly low  but hospitalized resulting in all of her blood pressure medications being discontinued.  She reported that her metoprolol  was discontinued as she had atrial fibrillation with slow ventricular response however on chart review there is no indication that she was in atrial fibrillation during her hospitalization.  She noted significant frustration as she felt that she was just as sick as she was when she initially presented the hospital.  She reported ongoing nausea and vomiting, reported that she been unable to keep food down since she left the hospital with multiple episodes of vomiting during the day as well as abdominal pain.  At office visit she reported at least 3 syncopal episodes since her hospital discharge, was unable to provide much detail surrounding episodes.  ED evaluation was discussed with patient given frequent syncopal episodes and inability to keep food down likely resulting in recurrent dehydration, patient reported she presented the ED for further evaluation.  30-day cardiac monitor and echo were ordered for further evaluation.   Echocardiogram on 09/30/2024 indicated LVEF 60 to 65%, no RWMA, mild LVH, RV systolic function was normal, RV mildly enlarged, noted to have dilation of the RV mid wall and RVOT, no evidence of mitral valve regurgitation or stenosis, mild calcification of the aortic valve with no evidence of stenosis or regurgitation.   Patient was monitored on cardiac monitor for 18 days and 12 hours.  Patient's predominant rhythm was normal sinus rhythm, she had 1 run of VT with fastest run of 152 bpm lasting 8 beats, PAC burden 1%, PVC burden 1%, there is no atrial fibrillation, no high degree heart block, no syncope reported.  2 patient triggered events for dyspnea, dizziness and chest pain which indicated normal sinus rhythm.  Cardiac  MRI indicated normal LV size, mild hypertrophy and normal systolic function, EF 55%, normal RV size with mild systolic dysfunction, EF 45%.  No  late gadolinium enhancement to suggest myocardial scar.  Today she presents for follow-up.  She reports that she has been doing well. She denies any further syncopal episodes or presyncope.  She denies any dizziness, lightheadedness.  She reports that intermittent slight chest discomfort that is overall stable, denies any significant changes, not associated with exertion.  She denies any significant shortness of breath.  She reports occasional palpitations however notes that these have typically been well-controlled and are infrequent.  She does note history of asthma and is followed by pulmonology, she reports a history of sleep apnea however has not been using her CPAP recently.  Patient notes that she has a newborn grandchild that is currently admitted and is potentially going to need open heart surgery.  ROS: .   Today she denies lower extremity edema, fatigue, melena, hematuria, hemoptysis, diaphoresis, weakness, presyncope, syncope, orthopnea, and PND.  All other systems are reviewed and otherwise negative. Studies Reviewed: SABRA   EKG:  EKG is not ordered today.  CV Studies: Cardiac studies reviewed are outlined and summarized above. Otherwise please see EMR for full report. Cardiac Studies & Procedures   ______________________________________________________________________________________________   STRESS TESTS  PCV MYOCARDIAL PERFUSION WO LEXISCAN  09/27/2021  Interpretation Summary Exercise nuclear stress test 09/27/2021: Normal ECG stress. The patient exercised for 7 minutes and 13 seconds of a Bruce protocol, achieving approximately 5.91 METs. Normal BP response. Reduced exercise tolerance. Breast tissue attenuation noted in the inferior wall.  Normal myocardial perfusion without ischemi aor scar. Gated SPECT imaging of the left ventricle was normal. All segments of left ventricle demonstrated normal wall motion and thickening. Normal left ventricle.  . No stress lung uptake. TID is  normal. Stress LV EF is normal 58%. No previous exam available for comparison. Low risk study.   ECHOCARDIOGRAM  ECHOCARDIOGRAM COMPLETE 09/30/2024  Narrative ECHOCARDIOGRAM REPORT    Patient Name:   Shelby Anderson Date of Exam: 09/30/2024 Medical Rec #:  [id number]            Height:       66.0 in Accession #:    [id number]           Weight:       191.0 lb Date of Birth:  [birth year]            BSA:          1.961 m Patient Age:    68 years             BP:           103/64 mmHg Patient Gender: F                    HR:           53 bpm. Exam Location:  [address]  Procedure: 2D Echo, Cardiac Doppler, Color Doppler and Intracardiac Opacification Agent (Both Spectral and Color Flow Doppler were utilized during procedure).  Indications:    LE Edema R60.0  History:        Patient has prior history of Echocardiogram examinations, most recent 09/27/2021. CHF, CAD, Arrythmias:Atrial Fibrillation; Risk Factors:Hypertension, Diabetes and Dyslipidemia.  Sonographer:    Augustin Seals RDCS Referring Phys: [id number] Senan Urey D Makena Murdock  IMPRESSIONS   1. Left ventricular ejection fraction, by estimation, is 60 to 65%. The left ventricle has normal function. The  left ventricle has no regional wall motion abnormalities. There is mild left ventricular hypertrophy. Left ventricular diastolic parameters are indeterminate. 2. Dilation of the RV mid wall and RVOT. Right ventricular systolic function is normal. The right ventricular size is mildly enlarged. 3. The mitral valve is grossly normal. No evidence of mitral valve regurgitation. No evidence of mitral stenosis. 4. The aortic valve is tricuspid. There is mild calcification of the aortic valve. There is mild thickening of the aortic valve. Aortic valve regurgitation is not visualized. No aortic stenosis is present.  Comparison(s): No significant change from prior study.  FINDINGS Left Ventricle: Left ventricular ejection fraction, by  estimation, is 60 to 65%. The left ventricle has normal function. The left ventricle has no regional wall motion abnormalities. Definity  contrast agent was given IV to delineate the left ventricular endocardial borders. The left ventricular internal cavity size was normal in size. There is mild left ventricular hypertrophy. Left ventricular diastolic parameters are indeterminate.  Right Ventricle: Dilation of the RV mid wall and RVOT. The right ventricular size is mildly enlarged. No increase in right ventricular wall thickness. Right ventricular systolic function is normal.  Left Atrium: Left atrial size was normal in size.  Right Atrium: Right atrial size was normal in size.  Pericardium: There is no evidence of pericardial effusion.  Mitral Valve: The mitral valve is grossly normal. Mild to moderate mitral annular calcification. No evidence of mitral valve regurgitation. No evidence of mitral valve stenosis.  Tricuspid Valve: The tricuspid valve is normal in structure. Tricuspid valve regurgitation is mild . No evidence of tricuspid stenosis.  Aortic Valve: The aortic valve is tricuspid. There is mild calcification of the aortic valve. There is mild thickening of the aortic valve. There is mild aortic valve annular calcification. Aortic valve regurgitation is not visualized. No aortic stenosis is present.  Pulmonic Valve: The pulmonic valve was not well visualized. Pulmonic valve regurgitation is not visualized. No evidence of pulmonic stenosis.  Aorta: The aortic root and ascending aorta are structurally normal, with no evidence of dilitation.  IAS/Shunts: No atrial level shunt detected by color flow Doppler.   LEFT VENTRICLE PLAX 2D LVIDd:         4.70 cm   Diastology LVIDs:         2.90 cm   LV e' medial:    9.57 cm/s LV PW:         1.20 cm   LV E/e' medial:  6.1 LV IVS:        1.10 cm   LV e' lateral:   11.30 cm/s LVOT diam:     2.30 cm   LV E/e' lateral: 5.1 LV SV:          89 LV SV Index:   46 LVOT Area:     4.15 cm   RIGHT VENTRICLE            IVC RV Basal diam:  3.50 cm    IVC diam: 1.80 cm RV Mid diam:    3.80 cm RV S prime:     9.68 cm/s TAPSE (M-mode): 1.9 cm  LEFT ATRIUM             Index        RIGHT ATRIUM           Index LA diam:        3.30 cm 1.68 cm/m   RA Area:     15.10 cm LA Vol (A2C):   48.0 ml  24.48 ml/m  RA Volume:   40.60 ml  20.70 ml/m LA Vol (A4C):   28.5 ml 14.53 ml/m LA Biplane Vol: 40.3 ml 20.55 ml/m AORTIC VALVE LVOT Vmax:   102.00 cm/s LVOT Vmean:  62.700 cm/s LVOT VTI:    0.215 m  AORTA Ao Root diam: 3.20 cm Ao Asc diam:  3.40 cm  MITRAL VALVE MV Area (PHT): 3.17 cm    SHUNTS MV Decel Time: 239 msec    Systemic VTI:  0.22 m MV E velocity: 58.10 cm/s  Systemic Diam: 2.30 cm MV A velocity: 68.60 cm/s MV E/A ratio:  0.85  Stanly Leavens MD Electronically signed by Stanly Leavens MD Signature Date/Time: 09/30/2024/5:01:51 PM    Final    MONITORS  CARDIAC EVENT MONITOR 10/02/2024  Narrative Images from the original result were not included. Event monitor for 19 days starting 09/07/2024 for syncope: Patient monitored for 18d 12h 28m 14 events were transmitted. 2 patient triggered; 12 auto triggered VT occurred 1 time( s) with fastest run 152 BPM 10, 214 PACs with PAC burden of 1% 11, 289 PVCs with PVC burden of 1% There was not atrial fibrillation. No high degree heart block. No syncope reported. Two patient triggered events for dyspnea, dizziness and chest pain: NSR.  NSVT 09/24/24 at 14:37 hours. Asymptomatic   Patient triggered event:     CARDIAC MRI  MR CARDIAC MORPHOLOGY W WO CONTRAST 10/20/2024  Narrative CLINICAL DATA:  70F with history of MINOCA, recurrent syncope s/p ILR. Echo with EF 60-65%, mild RV dilatation  EXAM: CARDIAC MRI  TECHNIQUE: The patient was scanned on a 1.5 Tesla Siemens magnet. A dedicated cardiac coil was used. Functional imaging was done using  Fiesta sequences. 2,3, and 4 chamber views were done to assess for RWMA's. Modified Simpson's rule using a short axis stack was used to calculate an ejection fraction on a dedicated [employer]. The patient received 12 cc of Gadavist . After 10 minutes inversion recovery sequences were used to assess for infiltration and scar tissue. Phase contrast velocity mapping was performed above the aortic and pulmonic valves  CONTRAST:  12 cc  of Gadavist   FINDINGS: Left ventricle:  -Normal size  -Mild hypertrophy  -Normal systolic function  -Normal ECV (25%)  -No LGE  LV EF:  55% (Normal 52-79%)  Absolute volumes:  LV EDV: (Normal 78-167 mL)  LV ESV: 49mL (Normal 21-64 mL)  LV SV: 59mL (Normal 52-114 mL)  CO: 3.7L/min (Normal 2.7-6.3 L/min)  Indexed volumes:  LV EDV: 86mL/sq-m (Normal 50-96 mL/sq-m)  LV ESV: 65mL/sq-m (Normal 10-40 mL/sq-m)  LV SV: 72mL/sq-m (Normal 33-64 mL/sq-m)  CI: 1.8L/min/sq-m (Normal 1.9-3.9 L/min/sq-m)  Right ventricle: Normal size with mild systolic dysfunction  RV EF: 45% (Normal 52-80%)  Absolute volumes:  RV EDV: (Normal 79-175 mL)  RV ESV: 69mL (Normal 13-75 mL)  RV SV: 58mL (Normal 56-110 mL)  CO: 3.6L/min (Normal 2.7-6 L/min)  Indexed volumes:  RV EDV: 44mL/sq-m (Normal 51-97 mL/sq-m)  RV ESV: 38mL/sq-m (Normal 9-42 mL/sq-m)  RV SV: 5mL/sq-m (Normal 35-61 mL/sq-m)  CI: 1.8L/min/sq-m (Normal 1.8-3.8 L/min/sq-m)  Left atrium: Normal size  Right atrium: Mild enlargement  Mitral valve: Mild regurgitation  Aortic valve: Trivial regurgitation  Tricuspid valve:Mild regurgitation  Pulmonic valve: Trivial regurgitation  Aorta: Normal proximal ascending aorta  Pericardium: Normal  IMPRESSION: 1. Normal LV size, mild hypertrophy, and normal systolic function (EF 55%)  2.  Normal RV size with mild systolic dysfunction (EF 45%)  3.  No late gadolinium enhancement to suggest  myocardial scar   Electronically Signed By: Lonni Nanas M.D. On: 10/20/2024 21:02   ______________________________________________________________________________________________       Current Reported Medications:.    Active Medications[1]  Physical Exam:   VS:  BP 124/82   Pulse 82   Ht 5' 6 (1.676 m)   Wt 196 lb (88.9 kg)   SpO2 96%   BMI 31.64 kg/m    Wt Readings from Last 3 Encounters:  11/19/24 196 lb (88.9 kg)  10/14/24 192 lb 9.6 oz (87.4 kg)  10/08/24 194 lb (88 kg)    GEN: Well nourished, well developed in no acute distress NECK: No JVD; No carotid bruits CARDIAC: RRR, no murmurs, rubs, gallops RESPIRATORY:  Clear to auscultation without rales, wheezing or rhonchi  ABDOMEN: Soft, non-tender, non-distended EXTREMITIES:  No edema; No acute deformity     Asessement and Plan:SABRA    Syncope/nausea/vomiting: In 08/2024  patient reported she had had multiple episodes of the last year regarding problems with increased nausea vomiting resulting in significant dehydration with increased presyncope.  She reported she continued problems with ongoing nausea and vomiting and reported multiple syncopal episodes, likely orthostatic hypotension in setting of dehydration.  It was recommended that patient receive ED evaluation as she was likely significantly hydrated, on chart review.  She presented to the ED however left without being evaluated.  Echo on 09/30/2024 indicated LVEF 60 to 65%, no RWMA, mild LVH, RV systolic function was normal, RV mildly enlarged, noted to have dilation of the RV mid wall and RVOT.  Patient cardiac monitor showed predominant rhythm as normal sinus rhythm, 1 run of VT lasting 8 beats at a rate of 152 bpm.  Her 2 triggered events for dyspnea and dizziness showed normal sinus rhythm.  Today she notes significant proved meant, reports that she feels significantly better.  She denies any further dizziness, lightheadedness, presyncope or syncope.  Denies  any further significant vomiting or nausea.  Reviewed ED precautions.  Hypertension: Blood pressure today 124/82.  Patient's blood pressure overall remains well-controlled, will not restart lisinopril  at this time.  RV dilation: Patient noted to have normal coronary arteries, patient with chronic intermittent chest pain with no significant episodes outside of normal occurrences.  Her echocardiogram was overall reassuring with LVEF 60 to 65%, no RWMA, she did have dilation of the RV mid wall and RVOT. Cardiac MRI indicated normal LV size, mild hypertrophy and normal systolic function, EF 55%, normal RV size with mild systolic dysfunction, EF 45%.  No late gadolinium enhancement to suggest myocardial scar.  Possibly related to history of asthma and OSA, encourage patient to resume CPAP use.  She denies any significant shortness of breath, lower extremity edema, orthopnea or PND.  She appears euvolemic well compensated on exam.  Patient's blood pressure has been well-controlled.  She has not required Lasix  in the last month, she may use as needed.  OSA: Patient reports she has not been using her CPAP, encouraged to resume.    Disposition: Patient plans to follow-up as needed.  Signed, Adyline Huberty D Maridee Slape, NP       [1]  No outpatient medications have been marked as taking for the 11/19/24 encounter (Office Visit) with Cortavious Nix D, NP.

## 2024-11-16 ENCOUNTER — Other Ambulatory Visit

## 2024-11-16 ENCOUNTER — Other Ambulatory Visit (HOSPITAL_COMMUNITY): Payer: Self-pay

## 2024-11-17 ENCOUNTER — Ambulatory Visit: Admitting: Internal Medicine

## 2024-11-19 ENCOUNTER — Encounter: Payer: Self-pay | Admitting: Cardiology

## 2024-11-19 ENCOUNTER — Ambulatory Visit: Admitting: Cardiology

## 2024-11-19 VITALS — BP 124/82 | HR 82 | Ht 66.0 in | Wt 196.0 lb

## 2024-11-19 DIAGNOSIS — G4733 Obstructive sleep apnea (adult) (pediatric): Secondary | ICD-10-CM

## 2024-11-19 DIAGNOSIS — R55 Syncope and collapse: Secondary | ICD-10-CM

## 2024-11-19 DIAGNOSIS — I10 Essential (primary) hypertension: Secondary | ICD-10-CM | POA: Diagnosis not present

## 2024-11-19 DIAGNOSIS — R079 Chest pain, unspecified: Secondary | ICD-10-CM

## 2024-11-19 DIAGNOSIS — I951 Orthostatic hypotension: Secondary | ICD-10-CM

## 2024-11-19 NOTE — Patient Instructions (Signed)
Medication Instructions:   Your physician recommends that you continue on your current medications as directed. Please refer to the Current Medication list given to you today.  *If you need a refill on your cardiac medications before your next appointment, please call your pharmacy*   Follow-Up:  As needed

## 2024-11-27 ENCOUNTER — Ambulatory Visit: Admitting: Neurology

## 2024-11-27 ENCOUNTER — Ambulatory Visit: Payer: Self-pay | Admitting: Neurology

## 2024-11-27 DIAGNOSIS — R200 Anesthesia of skin: Secondary | ICD-10-CM | POA: Diagnosis not present

## 2024-11-27 DIAGNOSIS — R202 Paresthesia of skin: Secondary | ICD-10-CM | POA: Diagnosis not present

## 2024-11-27 NOTE — Procedures (Signed)
 " [facility]  [address] [place], [address]  [place], [place] [address] Tel: [phone] Fax: [phone] Test Date:  11/27/2024  Patient: Genevra Orne DOB: [birth year] Physician: Tonita Blanch, DO  Sex: Female Height: 5' 6 Ref Phys: Tonita Blanch, DO  ID#: [id number]   Technician:    History: This is a 68 year old female referred for evaluation of paresthesias of the hands and feet.  NCV & EMG Findings: Extensive electrodiagnostic testing of the right upper and lower extremity shows:  Right median, ulnar, mixed palmar, sural, and superficial peroneal sensory responses are within normal limits. Right median, ulnar, and tibial motor responses are within normal limits.  Of note, there is a right Martin-Gruber anastomosis, a normal anatomic variant.  Right peroneal motor response is absent at the extensor digitorum brevis, and normal at the tibialis anterior.  In isolation, these findings are nonspecific and most likely due to localized compression from shoe wearing. Right tibial H reflex study is within normal limits. There is no evidence of active or chronic motor axonal loss changes affecting any of the tested muscles.  Motor unit configuration and recruitment pattern is within normal limits.  Impression: This is a normal study of the right upper and lower extremities.  In particular, there is no evidence of a large fiber sensorimotor polyneuropathy, cervical/lumbosacral radiculopathy, or carpal tunnel syndrome.   ___________________________ Tonita Blanch, DO    Nerve Conduction Studies   Stim Site NR Peak (ms) Norm Peak (ms) O-P Amp (V) Norm O-P Amp  Right Median Anti Sensory (2nd Digit)  32 C  Wrist    3.3 <3.8 20.0 >10  Right Sup Peroneal Anti Sensory (Ant Lat Mall)  32 C  12 cm    2.4 <4.6 5.9 >3  Right Sural Anti Sensory (Lat Mall)  32 C  Calf    2.4 <4.6 8.9 >3  Right Ulnar Anti Sensory (5th Digit)  32 C  Wrist    3.0 <3.2 15.2 >5     Stim Site NR  Onset (ms) Norm Onset (ms) O-P Amp (mV) Norm O-P Amp Site1 Site2 Delta-0 (ms) Dist (cm) Vel (m/s) Norm Vel (m/s)  Right Median Motor (Abd Poll Brev)  32 C  Wrist    3.3 <4.0 7.6 >5 Elbow Wrist 5.6 31.0 55 >50  Elbow    8.9  3.6  Ulnar-wrist crossover Elbow 5.1 0.0    Ulnar-wrist crossover    3.8  5.5         Right Peroneal Motor (Ext Dig Brev)  32 C  Ankle *NR  <6.0  >2.5 B Fib Ankle  0.0  >40  B Fib *NR     Poplt B Fib  0.0  >40  Poplt *NR            Right Peroneal TA Motor (Tib Ant)  32 C  Fib Head    2.7 <4.5 6.1 >3 Poplit Fib Head 1.5 8.0 53 >40  Poplit    4.2 <5.7 5.8         Right Tibial Motor (Abd Hall Brev)  32 C  Ankle    3.4 <6.0 11.5 >4 Knee Ankle 8.4 41.0 49 >40  Knee    11.8  9.3         Right Ulnar Motor (Abd Dig Minimi)  32 C  Wrist    3.1 <3.1 7.9 >7 B Elbow Wrist 3.9 22.0 56 >50  B Elbow    7.0  6.1  A Elbow B Elbow  1.8 10.0 56 >50  A Elbow    8.8  6.0            Stim Site NR Peak (ms) Norm Peak (ms) P-T Amp (V) Site1 Site2 Delta-P (ms) Norm Delta (ms)  Right Median/Ulnar Palm Comparison (Wrist - 8cm)  32 C  Median Palm    1.7 <2.2 48.7 Median Palm Ulnar Palm 0.0   Ulnar Palm    1.7 <2.2 14.2       Electromyography   Side Muscle Ins.Act Fibs Fasc Recrt Amp Dur Poly Activation Comment  Right 1stDorInt Nml Nml Nml Nml Nml Nml Nml Nml N/A  Right Abd Poll Brev Nml Nml Nml Nml Nml Nml Nml Nml N/A  Right PronatorTeres Nml Nml Nml Nml Nml Nml Nml Nml N/A  Right Biceps Nml Nml Nml Nml Nml Nml Nml Nml N/A  Right Triceps Nml Nml Nml Nml Nml Nml Nml Nml N/A  Right Deltoid Nml Nml Nml Nml Nml Nml Nml Nml N/A  Right AntTibialis Nml Nml Nml Nml Nml Nml Nml Nml N/A  Right Gastroc Nml Nml Nml Nml Nml Nml Nml Nml N/A  Right Flex Dig Long Nml Nml Nml Nml Nml Nml Nml Nml N/A  Right RectFemoris Nml Nml Nml Nml Nml Nml Nml Nml N/A  Right GluteusMed Nml Nml Nml Nml Nml Nml Nml Nml N/A      Waveforms:                           "

## 2024-12-09 ENCOUNTER — Other Ambulatory Visit: Payer: Self-pay

## 2024-12-10 ENCOUNTER — Other Ambulatory Visit: Payer: Self-pay

## 2024-12-11 ENCOUNTER — Ambulatory Visit: Admitting: Family Medicine

## 2024-12-15 ENCOUNTER — Encounter: Payer: Self-pay | Admitting: Family Medicine

## 2024-12-15 ENCOUNTER — Ambulatory Visit: Admitting: Family Medicine

## 2024-12-15 ENCOUNTER — Other Ambulatory Visit (HOSPITAL_COMMUNITY): Payer: Self-pay

## 2024-12-15 VITALS — BP 128/80 | HR 76 | Temp 98.2°F | Ht 66.0 in | Wt 202.7 lb

## 2024-12-15 DIAGNOSIS — F331 Major depressive disorder, recurrent, moderate: Secondary | ICD-10-CM | POA: Diagnosis not present

## 2024-12-15 DIAGNOSIS — E119 Type 2 diabetes mellitus without complications: Secondary | ICD-10-CM | POA: Diagnosis not present

## 2024-12-15 DIAGNOSIS — Z7984 Long term (current) use of oral hypoglycemic drugs: Secondary | ICD-10-CM | POA: Diagnosis not present

## 2024-12-15 LAB — POCT GLYCOSYLATED HEMOGLOBIN (HGB A1C): Hemoglobin A1C: 6.1 % — AB (ref 4.0–5.6)

## 2024-12-15 MED ORDER — BUPROPION HCL ER (XL) 150 MG PO TB24
150.0000 mg | ORAL_TABLET | Freq: Every day | ORAL | 1 refills | Status: AC
  Filled 2024-12-15: qty 90, 90d supply, fill #0
  Filled 2025-01-08: qty 30, 30d supply, fill #0
  Filled 2025-01-08: qty 90, 90d supply, fill #0

## 2024-12-15 MED ORDER — DULOXETINE HCL 60 MG PO CPEP
60.0000 mg | ORAL_CAPSULE | Freq: Every day | ORAL | 1 refills | Status: AC
  Filled 2024-12-15: qty 90, 90d supply, fill #0
  Filled 2025-01-08: qty 30, 30d supply, fill #0
  Filled 2025-01-08: qty 90, 90d supply, fill #0

## 2024-12-15 NOTE — Progress Notes (Unsigned)
 "  Established Patient Office Visit  Subjective   Patient ID: Shelby Anderson, female    DOB: 10/11/1956  Age: 69 y.o. MRN: [id number]  Chief Complaint  Patient presents with   Medical Management of Chronic Issues    HPI Discussed the use of AI scribe software for clinical note transcription with the patient, who gave verbal consent to proceed.  History of Present Illness   Shelby Anderson is a 69 year old female who presents for follow-up and medication management.  She continues magnesium  twice daily for prior low levels. Initial diarrhea has resolved. She has not had a follow-up magnesium  level checked.  Her diabetes is well controlled with a recent A1c of 6.1. She has regained prior weight loss and appetite. Home blood sugars are stable, with morning values over 79 and typically around 120. She has not restarted blood pressure medications.  She continues Farxiga  10 mg, metformin , glipizide , levothyroxine , oxycodone , Lyrica , Zanaflex , Pepcid , Wellbutrin , and Cymbalta  60 mg daily. Mood is stable without current depressive symptoms.  She reports hospitalizations each October for the past three years despite annual flu vaccination. She checks her blood pressure at home twice daily.       Current Outpatient Medications  Medication Instructions   albuterol  (PROVENTIL ) (2.5 MG/3ML) 0.083% nebulizer solution Inhale 3 mLs (2.5 mg total) by nebulization every 6 (six) hours as needed for wheezing or shortness of breath (J45.40).   albuterol  (VENTOLIN  HFA) 108 (90 Base) MCG/ACT inhaler 1-2 puffs, Inhalation, Every 4 hours PRN   atorvastatin  (LIPITOR ) 80 mg, Oral, Daily at bedtime   budesonide -formoterol  (SYMBICORT ) 80-4.5 MCG/ACT inhaler Inhale 2 puffs into the lungs every morning and another 2 puffs 12 hours later.   buPROPion  (WELLBUTRIN  XL) 150 mg, Oral, Daily   Calcium  Carbonate-Vitamin D 600-200 MG-UNIT TABS 1 tablet, Daily   CINNAMON PO 1,000 mg, 2 times daily    dapagliflozin  propanediol (FARXIGA ) 10 mg, Oral, Daily before breakfast   doxepin  (SINEQUAN ) 50 mg, Oral, Daily at bedtime   DULoxetine  (CYMBALTA ) 60 mg, Oral, Daily   famotidine  (PEPCID ) 20 mg, Oral, 2 times daily   fluticasone  (FLONASE ) 50 MCG/ACT nasal spray 1 spray, Daily PRN   furosemide  (LASIX ) 20 MG tablet Take 1 tablet (20 mg total) by mouth daily as needed for ankle swelling.   glipiZIDE  (GLUCOTROL  XL) 5 mg, Oral, BH-each morning   levothyroxine  (SYNTHROID ) 75 mcg, Oral, Daily before breakfast   metFORMIN  (GLUCOPHAGE ) 500 mg, Oral, 2 times daily with meals   Misc Natural Products (GLUCOS-CHONDROIT-MSM COMPLEX PO) 2 tablets, Daily   mometasone  (ELOCON ) 0.1 % cream 1 Application, Daily PRN   montelukast  (SINGULAIR ) 10 mg, Oral, Nightly, Need appointment for refills.   Multiple Vitamin (MULITIVITAMIN WITH MINERALS) TABS 1 tablet, Daily   NexIUM  40 mg, Oral, 2 times daily   nitroGLYCERIN  (NITROSTAT ) 0.4 MG SL tablet Place 1 tablet under the tongue every 5 minutes as needed for chest pain up to 3 times. If no relief, call 911.   NURTEC 75 MG TBDP 1 each, Oral, Daily PRN   ondansetron  (ZOFRAN -ODT) 4 MG disintegrating tablet Dissolve 1 tablet (4 mg total) by mouth 3 (three) times daily as needed for nausea or vomiting.   oxyCODONE  (ROXICODONE ) 7.5 mg, Oral, Every 12 hours PRN   Oxycodone  HCl 10 MG TABS Take 1 tablet by mouth four times a day. Stop oxycodone  15 mg   potassium chloride  (KLOR-CON ) 10 MEQ tablet 20 mEq, Oral, Every morning   pregabalin  (LYRICA ) 150 mg, Oral,  3 times daily   promethazine  (PHENERGAN ) 25 mg, Oral, Daily at bedtime   senna-docusate (SENOKOT-S) 8.6-50 MG tablet 1 tablet, Oral, 2 times daily   tiZANidine  (ZANAFLEX ) 8 mg, Oral, 3 times daily   tiZANidine  (ZANAFLEX ) 4 mg, Oral, 3 times daily   vitamin B-12 (CYANOCOBALAMIN ) 500 mcg, Daily   Vitamin D3 2,000 Units, Daily   vitamin E 200 Units, Daily    Patient Active Problem List   Diagnosis Date Noted   Diabetic  gastroparesis associated with type 2 diabetes mellitus (HCC) 10/14/2024   Acute encephalopathy 08/03/2024   Moderate episode of recurrent major depressive disorder (HCC) 02/10/2024   UTI (urinary tract infection) 08/01/2023   Nausea and vomiting 07/31/2023   Acute cystitis 07/31/2023   Hypothyroidism 09/11/2022   Dental caries 06/12/2022   Spondylosis without myelopathy or radiculopathy, cervical region 04/21/2021   Neutropenia with fever 10/01/2019   Genetic testing 09/17/2019   Family history of melanoma    Family history of pancreatic cancer    Malignant neoplasm of upper-outer quadrant of left breast in female, estrogen receptor negative (HCC) 09/07/2019   Encounter for loop recorder at end of battery life 04/24/2018   Esophageal stricture 07/01/2017   Hyperlipidemia associated with type 2 diabetes mellitus (HCC) 05/20/2017   Allergic rhinitis 03/11/2017   Dilated cardiomyopathy (HCC) 02/07/2017   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 11/13/2016   Morbid obesity due to excess calories (HCC) 09/26/2016   Increased endometrial stripe thickness 06/27/2016   Intramural leiomyoma of uterus 06/27/2016   Type 2 diabetes mellitus without complication, without long-term current use of insulin  (HCC) 12/06/2015   HTN (hypertension), benign 07/19/2015   Arrhythmia 07/19/2015   GERD (gastroesophageal reflux disease) 07/19/2015   Chronic back pain 07/19/2015   Neuropathy 07/19/2015   Symptomatic PVCs 11/02/2014   Syncope 11/02/2014   Status post placement of implantable loop recorder 11/02/2014   Stenosis of cervix 01/07/2014   Chest pain 12/23/2013   Disorder of cervix 03/10/2013   Vaginal atrophy 03/10/2013   OSA (obstructive sleep apnea) 07/31/2012   Asthma 04/29/2012   Coronary artery disease 11/20/2011     Review of Systems  All other systems reviewed and are negative.     Objective:     BP 128/80   Pulse 76   Temp 98.2 F (36.8 C) (Oral)   Ht 5' 6 (1.676 m)    Wt 202 lb 11.2 oz (91.9 kg)   SpO2 95%   BMI 32.72 kg/m    Physical Exam Vitals reviewed.  Constitutional:      Appearance: Normal appearance. She is obese.  Cardiovascular:     Rate and Rhythm: Normal rate and regular rhythm.     Heart sounds: Normal heart sounds. No murmur heard. Pulmonary:     Effort: Pulmonary effort is normal.     Breath sounds: Normal breath sounds. No wheezing.  Neurological:     Mental Status: She is alert and oriented to person, place, and time. Mental status is at baseline.  Psychiatric:        Mood and Affect: Mood normal.        Behavior: Behavior normal.      Results for orders placed or performed in visit on 12/15/24  Magnesium   Result Value Ref Range   Magnesium  2.1 1.5 - 2.5 mg/dL  POC HgB J8r  Result Value Ref Range   Hemoglobin A1C 6.1 (A) 4.0 - 5.6 %   HbA1c POC (<> result, manual entry)  HbA1c, POC (prediabetic range)     HbA1c, POC (controlled diabetic range)        The ASCVD Risk score (Arnett DK, et al., 2019) failed to calculate for the following reasons:   Risk score cannot be calculated because patient has a medical history suggesting prior/existing ASCVD   * - Cholesterol units were assumed    Assessment & Plan:  Type 2 diabetes mellitus without complication, without long-term current use of insulin  (HCC) -     POCT glycosylated hemoglobin (Hb A1C) -     Collection capillary blood specimen  Moderate episode of recurrent major depressive disorder (HCC) -     buPROPion  HCl ER (XL); Take 1 tablet (150 mg total) by mouth daily.  Dispense: 90 tablet; Refill: 1 -     DULoxetine  HCl; Take 1 capsule (60 mg total) by mouth daily.  Dispense: 90 capsule; Refill: 1  Hypomagnesemia -     Magnesium    Assessment and Plan    Hypomagnesemia Previously low magnesium  levels, currently on magnesium  supplementation twice daily. Initial diarrhea resolved. Awaiting recheck of magnesium  levels to determine continuation of  supplementation. - Recheck magnesium  levels - Continue magnesium  supplementation until levels are re-evaluated  Type 2 diabetes mellitus Well-controlled with current regimen. A1c is 6.1, indicating good glycemic control. Blood sugars are stable, with morning readings around 79 and daytime readings around 120. Weight has stabilized, indicating good appetite and nutritional status. - Continue current diabetes medications: Farxiga , metformin , and glipizide   Major depressive disorder, recurrent Mood symptoms are well-managed. Wellbutrin  (bupropion ) requires refill. Cymbalta  (duloxetine ) also requires refill. - Refilled Wellbutrin  (bupropion ) - Refilled Cymbalta  (duloxetine )  General Health Maintenance Annual physical and blood work planned for spring. Blood pressure monitoring at home twice daily. Magnesium  supplementation may aid in maintaining blood pressure control. - Will schedule annual physical and blood work for spring - Continue home blood pressure monitoring twice daily - Monitor blood pressure if magnesium  supplementation is discontinued        Return in about 6 months (around 06/14/2025) for annual physical exam.    Heron CHRISTELLA Sharper, MD "

## 2024-12-16 ENCOUNTER — Other Ambulatory Visit (HOSPITAL_COMMUNITY): Payer: Self-pay

## 2024-12-16 ENCOUNTER — Ambulatory Visit: Payer: Self-pay | Admitting: Family Medicine

## 2024-12-16 LAB — MAGNESIUM: Magnesium: 2.1 mg/dL (ref 1.5–2.5)

## 2024-12-17 ENCOUNTER — Other Ambulatory Visit: Payer: Self-pay

## 2024-12-24 ENCOUNTER — Ambulatory Visit: Admitting: Internal Medicine

## 2024-12-24 ENCOUNTER — Encounter: Payer: Self-pay | Admitting: Internal Medicine

## 2024-12-24 ENCOUNTER — Other Ambulatory Visit (HOSPITAL_COMMUNITY): Payer: Self-pay

## 2024-12-24 VITALS — BP 134/96 | HR 91 | Ht 66.0 in | Wt 198.0 lb

## 2024-12-24 DIAGNOSIS — K3184 Gastroparesis: Secondary | ICD-10-CM

## 2024-12-24 DIAGNOSIS — K222 Esophageal obstruction: Secondary | ICD-10-CM

## 2024-12-24 DIAGNOSIS — K219 Gastro-esophageal reflux disease without esophagitis: Secondary | ICD-10-CM | POA: Diagnosis not present

## 2024-12-24 DIAGNOSIS — R112 Nausea with vomiting, unspecified: Secondary | ICD-10-CM

## 2024-12-24 DIAGNOSIS — R197 Diarrhea, unspecified: Secondary | ICD-10-CM

## 2024-12-24 DIAGNOSIS — E1143 Type 2 diabetes mellitus with diabetic autonomic (poly)neuropathy: Secondary | ICD-10-CM | POA: Diagnosis not present

## 2024-12-24 DIAGNOSIS — R1310 Dysphagia, unspecified: Secondary | ICD-10-CM | POA: Diagnosis not present

## 2024-12-24 DIAGNOSIS — R110 Nausea: Secondary | ICD-10-CM

## 2024-12-24 DIAGNOSIS — K5900 Constipation, unspecified: Secondary | ICD-10-CM

## 2024-12-24 MED ORDER — ONDANSETRON 4 MG PO TBDP
4.0000 mg | ORAL_TABLET | Freq: Three times a day (TID) | ORAL | 6 refills | Status: AC | PRN
  Filled 2024-12-24: qty 90, 30d supply, fill #0

## 2024-12-24 NOTE — Patient Instructions (Signed)
 We have sent the following medications to your pharmacy for you to pick up at your convenience:  Zofran   You have been scheduled for an endoscopy. Please follow written instructions given to you at your visit today.  If you use inhalers (even only as needed), please bring them with you on the day of your procedure.  If you take any of the following medications, they will need to be adjusted prior to your procedure:   DO NOT TAKE 7 DAYS PRIOR TO TEST- Trulicity (dulaglutide) Ozempic, Wegovy (semaglutide) Mounjaro, Zepbound (tirzepatide) Bydureon Bcise (exanatide extended release)  DO NOT TAKE 1 DAY PRIOR TO YOUR TEST Rybelsus (semaglutide) Adlyxin (lixisenatide) Victoza (liraglutide) Byetta (exanatide) ___________________________________________________________________________  _______________________________________________________  If your blood pressure at your visit was 140/90 or greater, please contact your primary care physician to follow up on this.  _______________________________________________________  If you are age 67 or older, your body mass index should be between 23-30. Your Body mass index is 31.96 kg/m. If this is out of the aforementioned range listed, please consider follow up with your Primary Care Provider.  If you are age 67 or younger, your body mass index should be between 19-25. Your Body mass index is 31.96 kg/m. If this is out of the aformentioned range listed, please consider follow up with your Primary Care Provider.   ________________________________________________________  The  GI providers would like to encourage you to use MYCHART to communicate with providers for non-urgent requests or questions.  Due to long hold times on the telephone, sending your provider a message by [facility] may be a faster and more efficient way to get a response.  Please allow 48 business hours for a response.  Please remember that this is for non-urgent requests.   _______________________________________________________  [facility] is using a team-based approach to care.  Your team is made up of your doctor and two to three APPS. Our APPS (Nurse Practitioners and Physician Assistants) work with your physician to ensure care continuity for you. They are fully qualified to address your health concerns and develop a treatment plan. They communicate directly with your gastroenterologist to care for you. Seeing the Advanced Practice Practitioners on your physician's team can help you by facilitating care more promptly, often allowing for earlier appointments, access to diagnostic testing, procedures, and other specialty referrals.

## 2024-12-24 NOTE — Progress Notes (Signed)
 HISTORY OF PRESENT ILLNESS:  Shelby Anderson is a 69 y.o. female with multiple medical problems as outlined below including chronic pain syndrome on chronic narcotics.  She was last seen in the office September 17, 2024 regarding chronic nausea with vomiting and weight loss.  She dictation for details.  The impression and plan from that encounter are as follows:  ASSESSMENT:   1.  Chronic nausea with vomiting.  Likely narcotic related.  Rule out gastroparesis 2.  Chronic GERD.  Poorly controlled off medication 3.  Diabetes mellitus 4.  Multiple significant medical problems 5.  Prior colonoscopy and upper endoscopy as described     PLAN:   1.  Prescribing dissolvable sublingual Zofran  4 mg.  Take 20 minutes before medications and meals (up to 3 times per day). 2.  Increase Nexium  to twice daily 3.  Schedule gastric emptying scan to rule out gastroparesis 4.  Routine GI follow-up 2 months.  She follows up at this time.  She is pleased to tell me that the sublingual Zofran  works nicely.  It alleviates nausea.  No further problems with vomiting.  She is able to take her pills.  She did increase her Nexium  to twice daily.  We scheduled a gastric emptying scan which confirmed gastroparesis.  Felt to be narcotic related versus diabetes versus a combination.  Her to complaints today are recurrence of solid food dysphagia.  She had issues with this recently.  She is noted to have esophageal stricture which has been dilated.  Next, she reports taking a daily laxative.  Senokot.  This resulted in daily bowel movements.  However, after 4 movements she describes loose stools.  Her weight has been stable since her last visit.    REVIEW OF SYSTEMS:  All non-GI ROS negative except for arthritis, muscle aches, anxiety, back pain, visual change, depression, migraines, irregular heartbeat, night sweats, swollen ankles, excessive thirst, shortness of breath  Past Medical History:  Diagnosis Date    Allergy     Anemia    chronic   Angina    Prinzmetal angina   Anxiety and depression    Arthritis    Asthma    Atrial fibrillation (HCC)    h/o AF w/frequent PVCs   Breast cancer (HCC)    Left   Cancer (HCC)    hx of skin cancer    CHF (congestive heart failure) (HCC)    Chronic back pain greater than 3 months duration    on chronic narcotics, treated at pain clinic   Chronic respiratory failure (HCC) 09/15/2015   ONO 09/04/15 + desats >begin O2 at 2l/ m    Colon polyps    hyperplastic   Coronary artery disease    Arrythmia, orthostatic hypotension, HLD, HTN; sees Dr. Ladona   Difficult intubation    TMJ & woke up when they were still cutting on me   Dysrhythmia    sees Dr. Ladona and a cardiologist at [facility]   Esophageal stricture    Family history of melanoma ]   Family history of pancreatic cancer    Fatty liver    Fatty liver    Fibroids    Fibromyalgia    in my legs   GERD (gastroesophageal reflux disease)    hx hiatal hernia, stricture and gastric ulcer   Headache(784.0)    migraines   Heart murmur    Hiatal hernia    History of loop recorder    History of migraines    dx'd when  I was in my teens   Hyperlipemia    Hypertension    Mental disorder    Mild episode of recurrent major depressive disorder 12/06/2015   Myocardial infarction [facility]) 1980's & 1990;   sees Dr. Ladona   OSA (obstructive sleep apnea)    Personal history of chemotherapy    Personal history of radiation therapy    Pneumonia    multiple times   PONV (postoperative nausea and vomiting)    Recurrent upper respiratory infection (URI)    S/P left TKA 09/25/2016   Shortness of breath 11/20/11   all the time, sees pulmonlogy, ? asthma   Stenotic cervical os    Stomach ulcer    3 small; found in 05/2011   TMJ (dislocation of temporomandibular joint)    Tuberculosis    + TB SKIN TEST   Type 2 diabetes mellitus without complication, without long-term current use of insulin   (HCC) 12/06/2015   not on meds     Past Surgical History:  Procedure Laterality Date   ACHILLES TENDON REPAIR  1970's   left ankle   ARTHROSCOPIC REPAIR ACL     left knee cap   BREAST BIOPSY Right 04/08/2013   again in October or November 2020   BREAST LUMPECTOMY Left 12/09/2019   BREAST LUMPECTOMY WITH RADIOACTIVE SEED AND SENTINEL LYMPH NODE BIOPSY Left 12/09/2019   Procedure: LEFT BREAST LUMPECTOMY WITH RADIOACTIVE SEED AND LEFT AXILLARY SENTINEL LYMPH NODE BIOPSY;  Surgeon: Ebbie Cough, MD;  Location: [facility] OR;  Service: General;  Laterality: Left;  PEC BLOCK   CARDIAC CATHETERIZATION     loop recorder   CARPAL TUNNEL RELEASE  unknown   left hand   COLONOSCOPY     ESOPHAGOGASTRODUODENOSCOPY (EGD) WITH PROPOFOL  N/A 10/29/2017   Procedure: ESOPHAGOGASTRODUODENOSCOPY (EGD) WITH PROPOFOL ;  Surgeon: Abran Norleen SAILOR, MD;  Location: [facility] ENDOSCOPY;  Service: Endoscopy;  Laterality: N/A;   LOOP RECORDER IMPLANT     PORT-A-CATH REMOVAL N/A 12/09/2019   Procedure: REMOVAL PORT-A-CATH;  Surgeon: Ebbie Cough, MD;  Location: [facility] OR;  Service: General;  Laterality: N/A;   PORTACATH PLACEMENT N/A 09/23/2019   Procedure: INSERTION PORT-A-CATH Right Internal Brooke RATER;  Surgeon: Ebbie Cough, MD;  Location: [facility] OR;  Service: General;  Laterality: N/A;   post ganglionectomy  1970's   for migraine headaches   pouch string  [phone]   did this 3 times (once w/each pregnancy)   SAVORY DILATION N/A 10/29/2017   Procedure: SAVORY DILATION;  Surgeon: Abran Norleen SAILOR, MD;  Location: [facility] ENDOSCOPY;  Service: Endoscopy;  Laterality: N/A;   TOTAL KNEE ARTHROPLASTY Left 09/25/2016   Procedure: LEFT TOTAL KNEE ARTHROPLASTY;  Surgeon: Cough Car, MD;  Location: [facility] ORS;  Service: Orthopedics;  Laterality: Left;   TUBAL LIGATION  1980's    Social History Shelby Anderson  reports that she has never smoked. She has never used smokeless tobacco. She reports that she does not  drink alcohol and does not use drugs.  family history includes Cancer in her brother, brother, father, paternal grandmother, sister, and sister; Cancer (age of onset: 36) in her brother; Cancer (age of onset: 37) in her nephew; Diabetes in her father and sister; Heart disease in her father, maternal grandfather, maternal grandmother, mother, paternal grandfather, paternal grandmother, and sister; Hypertension in her father, mother, sister, and sister; Malignant hyperthermia in her father; Multiple myeloma in her mother; Other (age of onset: 27) in her maternal grandmother; Pancreatic cancer (age of onset: 69) in her niece.  Allergies[1]     PHYSICAL EXAMINATION: Vital signs: BP (!) 134/96   Pulse 91   Ht 5' 6 (1.676 m)   Wt 198 lb (89.8 kg)   BMI 31.96 kg/m   Constitutional: Chronically ill-appearing female, poorly kempt, no acute distress Psychiatric: alert and oriented x3, cooperative Eyes: extraocular movements intact, anicteric, conjunctiva pink Mouth: oral pharynx moist, no lesions.  No thrush.  POOR DENTITION Neck: supple no lymphadenopathy Cardiovascular: heart regular rate and rhythm, no murmur Lungs: clear to auscultation bilaterally with rare wheeze Abdomen: soft, obese, nontender, nondistended, no obvious ascites, no peritoneal signs, normal bowel sounds, no organomegaly Rectal: Good, Extremities: no clubbing or cyanosis.  1+ lower extremity edema bilaterally Skin: no lesions on visible extremities Neuro: No focal deficits.  Cranial nerves intact  ASSESSMENT:   1.  Chronic nausea with vomiting.  This is likely due to a combination of gastroparesis and poorly controlled GERD.  Currently improved with sublingual Zofran  and twice daily PPI 2.  Chronic GERD.  Improved on twice daily PPI 3.  Gastroparesis.  Diabetes mellitus and/or narcotic related. 4.  Multiple significant medical problems including diabetes mellitus 5.  Prior colonoscopy and upper endoscopy as described  previously 6.  Dysphagia.  Known peptic stricture.  Also felt to have a component of dysmotility.  Poor dentition contributes. 7.  Loose stools after laxatives.  Expected 8.  Colonoscopy 2022     PLAN:   1.  Continue dissolvable sublingual Zofran  4 mg.  Take 20 minutes before medications and meals (up to 3 times per day).  Prescribed 2.  Continue Nexium  to twice daily 3.  Eat smaller portions to aid with digestion.  Low-fat meals 4.  Advised to try to decrease frequency of laxatives if she is experiencing the side effect of loose stools. 5.  Schedule upper endoscopy with esophageal dilation for dysphagia.  The patient is high risk given her comorbidities and the need to adjust medications for her procedure. 6.  Hold diabetic medications the day of the procedure 7.  Routine follow-up colonoscopy due around August 2032  Total time of 45 minutes was spent preparing to see the patient, reviewing multiple records and studies, obtaining comprehensive history, performing medically appropriate physical exam, counseling and educating the patient regarding the above listed issues, ordering medication, ordering advanced therapeutic endoscopic procedure, adjusting medications, and documenting clinical information in the health record           [1]  Allergies Allergen Reactions   Lodine [Etodolac] Anaphylaxis, Hives and Swelling   Oxycontin  [Oxycodone  Hcl] Anaphylaxis    hives, trouble breathing, tongue swelling (Only Oxycontin ) Tolerates plain oxycodone .   Penicillins Anaphylaxis    Told by a surgeon never to take it again.   Aspirin  Other (See Comments)    High-dose caused GI Bleeds   Darvocet [Propoxyphene N-Acetaminophen ] Hives   Nitroglycerin  Other (See Comments)    IV-BP drops dramatically Can take po   Propoxyphene Hives   Tramadol Hives and Itching   Valium Other (See Comments)    Circulation problems. Legs turned black.

## 2025-01-01 ENCOUNTER — Telehealth: Payer: Self-pay | Admitting: Internal Medicine

## 2025-01-01 NOTE — Telephone Encounter (Signed)
 Inbound call from patient stating [employer] is needing a form to help assist patient for medication esomeprazole   Requesting a call back  Please advise  Thank you

## 2025-01-05 NOTE — Telephone Encounter (Signed)
 Spoke with patient - insurance company has requested she renew her assistance for this year for Esomeprazole .  I have not received any paperwork for this yet.  Is is possible to initiate this or do I need to print out an application from online?

## 2025-01-06 ENCOUNTER — Ambulatory Visit: Payer: Medicare Other | Admitting: Hematology and Oncology

## 2025-01-07 ENCOUNTER — Other Ambulatory Visit: Payer: Self-pay

## 2025-01-07 ENCOUNTER — Other Ambulatory Visit (HOSPITAL_COMMUNITY): Payer: Self-pay

## 2025-01-07 MED ORDER — PREGABALIN 150 MG PO CAPS: 150.0000 mg | ORAL_CAPSULE | Freq: Three times a day (TID) | ORAL | 2 refills | Status: AC

## 2025-01-07 MED ORDER — DULOXETINE HCL 30 MG PO CPEP
30.0000 mg | ORAL_CAPSULE | Freq: Every day | ORAL | 2 refills | Status: AC
  Filled 2025-01-07: qty 30, 30d supply, fill #0

## 2025-01-07 NOTE — Telephone Encounter (Signed)
 Called and spoke with patient and asked if we still needed patient assistance for esomeprazole  with a test claim on $10.45. Patient states she needs BRAND Nexium  for patient assistance.

## 2025-01-07 NOTE — Telephone Encounter (Signed)
 I processed a test claim and it shows a co-pay of $10.45. Does she still need assistance for this?

## 2025-01-07 NOTE — Telephone Encounter (Signed)
 I will look into this.

## 2025-01-08 ENCOUNTER — Other Ambulatory Visit: Payer: Self-pay | Admitting: Family Medicine

## 2025-01-08 ENCOUNTER — Other Ambulatory Visit (HOSPITAL_COMMUNITY): Payer: Self-pay

## 2025-01-08 ENCOUNTER — Other Ambulatory Visit: Payer: Self-pay

## 2025-01-08 MED ORDER — POTASSIUM CHLORIDE ER 10 MEQ PO TBCR
20.0000 meq | EXTENDED_RELEASE_TABLET | Freq: Every morning | ORAL | 1 refills | Status: AC
  Filled 2025-01-08: qty 60, 30d supply, fill #0

## 2025-01-08 MED ORDER — GLIPIZIDE ER 5 MG PO TB24
5.0000 mg | ORAL_TABLET | ORAL | 1 refills | Status: AC
  Filled 2025-01-08: qty 30, 30d supply, fill #0

## 2025-01-08 NOTE — Telephone Encounter (Signed)
 There is no patient assistance for Nexium . Did patient mean that she needed a prior authorization?

## 2025-01-18 ENCOUNTER — Inpatient Hospital Stay: Admitting: Hematology and Oncology

## 2025-01-19 ENCOUNTER — Encounter: Admitting: Internal Medicine

## 2025-02-10 ENCOUNTER — Ambulatory Visit: Admitting: Podiatry

## 2025-06-15 ENCOUNTER — Encounter: Admitting: Family Medicine

## 2025-10-11 ENCOUNTER — Ambulatory Visit
